# Patient Record
Sex: Male | Born: 1940 | Race: White | Hispanic: No | Marital: Married | Smoking: Former smoker
Health system: Southern US, Community
[De-identification: ages and names within clinical notes are randomized; demographics above are authoritative.]

## PROBLEM LIST (undated history)

## (undated) DIAGNOSIS — J449 Chronic obstructive pulmonary disease, unspecified: Secondary | ICD-10-CM

## (undated) DIAGNOSIS — I739 Peripheral vascular disease, unspecified: Secondary | ICD-10-CM

## (undated) DIAGNOSIS — N183 Chronic kidney disease, stage 3 unspecified: Secondary | ICD-10-CM

## (undated) DIAGNOSIS — R0600 Dyspnea, unspecified: Secondary | ICD-10-CM

## (undated) DIAGNOSIS — Z860100 Personal history of colon polyps, unspecified: Secondary | ICD-10-CM

## (undated) DIAGNOSIS — R233 Spontaneous ecchymoses: Secondary | ICD-10-CM

## (undated) DIAGNOSIS — K219 Gastro-esophageal reflux disease without esophagitis: Secondary | ICD-10-CM

## (undated) DIAGNOSIS — I5032 Chronic diastolic (congestive) heart failure: Secondary | ICD-10-CM

## (undated) DIAGNOSIS — E119 Type 2 diabetes mellitus without complications: Secondary | ICD-10-CM

## (undated) DIAGNOSIS — M069 Rheumatoid arthritis, unspecified: Secondary | ICD-10-CM

## (undated) DIAGNOSIS — I2510 Atherosclerotic heart disease of native coronary artery without angina pectoris: Secondary | ICD-10-CM

## (undated) DIAGNOSIS — J189 Pneumonia, unspecified organism: Secondary | ICD-10-CM

## (undated) DIAGNOSIS — L408 Other psoriasis: Secondary | ICD-10-CM

## (undated) DIAGNOSIS — C4490 Unspecified malignant neoplasm of skin, unspecified: Secondary | ICD-10-CM

## (undated) DIAGNOSIS — R238 Other skin changes: Secondary | ICD-10-CM

## (undated) DIAGNOSIS — E785 Hyperlipidemia, unspecified: Secondary | ICD-10-CM

## (undated) DIAGNOSIS — L409 Psoriasis, unspecified: Secondary | ICD-10-CM

## (undated) DIAGNOSIS — Z9289 Personal history of other medical treatment: Secondary | ICD-10-CM

## (undated) DIAGNOSIS — R0609 Other forms of dyspnea: Secondary | ICD-10-CM

## (undated) DIAGNOSIS — L570 Actinic keratosis: Secondary | ICD-10-CM

## (undated) DIAGNOSIS — I350 Nonrheumatic aortic (valve) stenosis: Secondary | ICD-10-CM

## (undated) DIAGNOSIS — L4050 Arthropathic psoriasis, unspecified: Secondary | ICD-10-CM

## (undated) DIAGNOSIS — I213 ST elevation (STEMI) myocardial infarction of unspecified site: Secondary | ICD-10-CM

## (undated) DIAGNOSIS — M549 Dorsalgia, unspecified: Secondary | ICD-10-CM

## (undated) DIAGNOSIS — M48061 Spinal stenosis, lumbar region without neurogenic claudication: Secondary | ICD-10-CM

## (undated) DIAGNOSIS — M2550 Pain in unspecified joint: Secondary | ICD-10-CM

## (undated) DIAGNOSIS — IMO0002 Reserved for concepts with insufficient information to code with codable children: Secondary | ICD-10-CM

## (undated) DIAGNOSIS — M353 Polymyalgia rheumatica: Secondary | ICD-10-CM

## (undated) DIAGNOSIS — K2970 Gastritis, unspecified, without bleeding: Secondary | ICD-10-CM

## (undated) DIAGNOSIS — G4730 Sleep apnea, unspecified: Secondary | ICD-10-CM

## (undated) DIAGNOSIS — I779 Disorder of arteries and arterioles, unspecified: Secondary | ICD-10-CM

## (undated) DIAGNOSIS — M4120 Other idiopathic scoliosis, site unspecified: Secondary | ICD-10-CM

## (undated) DIAGNOSIS — I119 Hypertensive heart disease without heart failure: Secondary | ICD-10-CM

## (undated) DIAGNOSIS — I480 Paroxysmal atrial fibrillation: Secondary | ICD-10-CM

## (undated) DIAGNOSIS — K922 Gastrointestinal hemorrhage, unspecified: Secondary | ICD-10-CM

## (undated) DIAGNOSIS — N200 Calculus of kidney: Secondary | ICD-10-CM

## (undated) DIAGNOSIS — M1990 Unspecified osteoarthritis, unspecified site: Secondary | ICD-10-CM

## (undated) DIAGNOSIS — G8929 Other chronic pain: Secondary | ICD-10-CM

## (undated) HISTORY — DX: Atherosclerotic heart disease of native coronary artery without angina pectoris: I25.10

## (undated) HISTORY — DX: Paroxysmal atrial fibrillation: I48.0

## (undated) HISTORY — PX: BACK SURGERY: SHX140

## (undated) HISTORY — DX: Other idiopathic scoliosis, site unspecified: M41.20

## (undated) HISTORY — DX: Reserved for concepts with insufficient information to code with codable children: IMO0002

## (undated) HISTORY — DX: Hypertensive heart disease without heart failure: I11.9

## (undated) HISTORY — DX: Gastritis, unspecified, without bleeding: K29.70

## (undated) HISTORY — DX: Chronic kidney disease, stage 3 unspecified: N18.30

## (undated) HISTORY — DX: Nonrheumatic aortic (valve) stenosis: I35.0

## (undated) HISTORY — DX: Actinic keratosis: L57.0

## (undated) HISTORY — DX: Personal history of colon polyps, unspecified: Z86.0100

## (undated) HISTORY — DX: Arthropathic psoriasis, unspecified: L40.50

## (undated) HISTORY — PX: SKIN CANCER EXCISION: SHX779

## (undated) HISTORY — DX: Gastrointestinal hemorrhage, unspecified: K92.2

## (undated) HISTORY — DX: Disorder of arteries and arterioles, unspecified: I77.9

## (undated) HISTORY — PX: POSTERIOR LAMINECTOMY THORACIC SPINE: SHX2252

## (undated) HISTORY — DX: Rheumatoid arthritis, unspecified: M06.9

## (undated) HISTORY — DX: Dyspnea, unspecified: R06.00

## (undated) HISTORY — DX: Gastro-esophageal reflux disease without esophagitis: K21.9

## (undated) HISTORY — DX: Other psoriasis: L40.8

## (undated) HISTORY — DX: Personal history of colonic polyps: Z86.010

## (undated) HISTORY — DX: Chronic diastolic (congestive) heart failure: I50.32

## (undated) HISTORY — DX: Peripheral vascular disease, unspecified: I73.9

## (undated) HISTORY — DX: Polymyalgia rheumatica: M35.3

## (undated) HISTORY — DX: Personal history of other medical treatment: Z92.89

## (undated) HISTORY — DX: Chronic kidney disease, stage 3 (moderate): N18.3

## (undated) HISTORY — DX: Other forms of dyspnea: R06.09

## (undated) HISTORY — DX: Spinal stenosis, lumbar region without neurogenic claudication: M48.061

## (undated) HISTORY — PX: COLONOSCOPY: SHX174

---

## 1977-06-12 HISTORY — PX: VASECTOMY: SHX75

## 1998-06-12 HISTORY — PX: SHOULDER OPEN ROTATOR CUFF REPAIR: SHX2407

## 1999-06-02 ENCOUNTER — Ambulatory Visit (HOSPITAL_BASED_OUTPATIENT_CLINIC_OR_DEPARTMENT_OTHER): Admission: RE | Admit: 1999-06-02 | Discharge: 1999-06-03 | Payer: Self-pay | Admitting: Orthopaedic Surgery

## 2003-08-16 ENCOUNTER — Encounter: Admission: RE | Admit: 2003-08-16 | Discharge: 2003-08-16 | Payer: Self-pay | Admitting: Orthopedic Surgery

## 2003-09-09 ENCOUNTER — Encounter: Admission: RE | Admit: 2003-09-09 | Discharge: 2003-09-09 | Payer: Self-pay | Admitting: Orthopedic Surgery

## 2003-09-30 ENCOUNTER — Encounter: Admission: RE | Admit: 2003-09-30 | Discharge: 2003-09-30 | Payer: Self-pay | Admitting: Orthopedic Surgery

## 2003-10-21 ENCOUNTER — Encounter: Admission: RE | Admit: 2003-10-21 | Discharge: 2003-10-21 | Payer: Self-pay | Admitting: Orthopedic Surgery

## 2004-06-24 ENCOUNTER — Encounter: Admission: RE | Admit: 2004-06-24 | Discharge: 2004-09-22 | Payer: Self-pay | Admitting: Neurosurgery

## 2004-07-21 ENCOUNTER — Ambulatory Visit: Payer: Self-pay | Admitting: Family Medicine

## 2004-08-03 ENCOUNTER — Ambulatory Visit: Payer: Self-pay | Admitting: Family Medicine

## 2004-08-17 ENCOUNTER — Ambulatory Visit: Payer: Self-pay | Admitting: Family Medicine

## 2005-10-31 ENCOUNTER — Ambulatory Visit: Payer: Self-pay | Admitting: Family Medicine

## 2005-11-27 ENCOUNTER — Ambulatory Visit: Payer: Self-pay | Admitting: Family Medicine

## 2005-11-27 LAB — CONVERTED CEMR LAB: PSA: 0.61 ng/mL

## 2006-03-15 ENCOUNTER — Ambulatory Visit: Payer: Self-pay | Admitting: Family Medicine

## 2006-04-04 ENCOUNTER — Ambulatory Visit: Payer: Self-pay | Admitting: Family Medicine

## 2006-05-30 ENCOUNTER — Ambulatory Visit: Payer: Self-pay | Admitting: Family Medicine

## 2006-07-05 ENCOUNTER — Ambulatory Visit: Payer: Self-pay | Admitting: Family Medicine

## 2006-07-05 LAB — CONVERTED CEMR LAB
ALT: 17 units/L (ref 0–40)
AST: 20 units/L (ref 0–37)
Direct LDL: 155.9 mg/dL
Hgb A1c MFr Bld: 6.4 % — ABNORMAL HIGH (ref 4.6–6.0)

## 2006-09-18 ENCOUNTER — Encounter: Payer: Self-pay | Admitting: Family Medicine

## 2006-09-18 DIAGNOSIS — IMO0002 Reserved for concepts with insufficient information to code with codable children: Secondary | ICD-10-CM

## 2006-09-18 DIAGNOSIS — M4120 Other idiopathic scoliosis, site unspecified: Secondary | ICD-10-CM | POA: Insufficient documentation

## 2006-09-18 DIAGNOSIS — E119 Type 2 diabetes mellitus without complications: Secondary | ICD-10-CM | POA: Insufficient documentation

## 2006-09-18 DIAGNOSIS — Z87891 Personal history of nicotine dependence: Secondary | ICD-10-CM

## 2006-09-18 DIAGNOSIS — L408 Other psoriasis: Secondary | ICD-10-CM

## 2006-09-18 DIAGNOSIS — L4050 Arthropathic psoriasis, unspecified: Secondary | ICD-10-CM

## 2006-09-18 DIAGNOSIS — L570 Actinic keratosis: Secondary | ICD-10-CM

## 2006-10-08 ENCOUNTER — Ambulatory Visit: Payer: Self-pay | Admitting: Family Medicine

## 2006-10-08 LAB — CONVERTED CEMR LAB
Albumin: 3.6 g/dL (ref 3.5–5.2)
GFR calc Af Amer: 86 mL/min
GFR calc non Af Amer: 71 mL/min
Glucose, Bld: 138 mg/dL — ABNORMAL HIGH (ref 70–99)
Microalb Creat Ratio: 7.7 mg/g (ref 0.0–30.0)
Phosphorus: 2.7 mg/dL (ref 2.3–4.6)
Sodium: 144 meq/L (ref 135–145)

## 2006-10-12 ENCOUNTER — Ambulatory Visit: Payer: Self-pay | Admitting: Family Medicine

## 2007-01-14 ENCOUNTER — Ambulatory Visit: Payer: Self-pay | Admitting: Family Medicine

## 2007-01-17 ENCOUNTER — Ambulatory Visit: Payer: Self-pay | Admitting: Family Medicine

## 2007-01-17 DIAGNOSIS — Z860100 Personal history of colon polyps, unspecified: Secondary | ICD-10-CM | POA: Insufficient documentation

## 2007-01-17 LAB — CONVERTED CEMR LAB
ALT: 15 units/L (ref 0–53)
Creatinine,U: 126.7 mg/dL
HDL: 27.3 mg/dL — ABNORMAL LOW (ref >39.0)
Hgb A1c MFr Bld: 6.1 % — ABNORMAL HIGH (ref 4.6–6.0)
Total CHOL/HDL Ratio: 6.9
Triglycerides: 68 mg/dL (ref 0–149)
VLDL: 14 mg/dL (ref 0–40)

## 2007-03-23 ENCOUNTER — Encounter: Admission: RE | Admit: 2007-03-23 | Discharge: 2007-03-23 | Payer: Self-pay

## 2007-04-05 ENCOUNTER — Encounter: Admission: RE | Admit: 2007-04-05 | Discharge: 2007-04-05 | Payer: Self-pay

## 2007-04-19 ENCOUNTER — Encounter: Admission: RE | Admit: 2007-04-19 | Discharge: 2007-04-19 | Payer: Self-pay

## 2007-04-19 ENCOUNTER — Ambulatory Visit: Payer: Self-pay | Admitting: Family Medicine

## 2007-04-19 ENCOUNTER — Telehealth (INDEPENDENT_AMBULATORY_CARE_PROVIDER_SITE_OTHER): Payer: Self-pay | Admitting: *Deleted

## 2007-04-22 LAB — CONVERTED CEMR LAB
Albumin: 3.8 g/dL (ref 3.5–5.2)
Chloride: 104 meq/L (ref 96–112)
Cholesterol: 210 mg/dL (ref 0–200)
Creatinine, Ser: 1.2 mg/dL (ref 0.4–1.5)
Direct LDL: 160 mg/dL
Glucose, Bld: 110 mg/dL — ABNORMAL HIGH (ref 70–99)
HDL: 30 mg/dL — ABNORMAL LOW (ref >39.0)
PSA: 0.63 ng/mL (ref 0.10–4.00)
Phosphorus: 3.3 mg/dL (ref 2.3–4.6)
Sodium: 139 meq/L (ref 135–145)
Triglycerides: 84 mg/dL (ref 0–149)

## 2007-04-24 ENCOUNTER — Encounter: Payer: Self-pay | Admitting: Family Medicine

## 2007-05-03 ENCOUNTER — Encounter: Admission: RE | Admit: 2007-05-03 | Discharge: 2007-05-03 | Payer: Self-pay

## 2007-06-13 HISTORY — PX: LITHOTRIPSY: SUR834

## 2008-04-21 ENCOUNTER — Encounter: Payer: Self-pay | Admitting: Family Medicine

## 2008-04-27 ENCOUNTER — Encounter: Payer: Self-pay | Admitting: Family Medicine

## 2008-04-29 ENCOUNTER — Ambulatory Visit: Payer: Self-pay | Admitting: Family Medicine

## 2008-04-29 DIAGNOSIS — M069 Rheumatoid arthritis, unspecified: Secondary | ICD-10-CM

## 2008-05-02 LAB — CONVERTED CEMR LAB
Cholesterol: 229 mg/dL (ref 0–200)
HDL: 27.5 mg/dL — ABNORMAL LOW (ref >39.0)
Triglycerides: 129 mg/dL (ref 0–149)
VLDL: 26 mg/dL (ref 0–40)

## 2008-05-04 ENCOUNTER — Telehealth (INDEPENDENT_AMBULATORY_CARE_PROVIDER_SITE_OTHER): Payer: Self-pay | Admitting: *Deleted

## 2008-05-13 ENCOUNTER — Ambulatory Visit: Payer: Self-pay | Admitting: Internal Medicine

## 2008-05-20 ENCOUNTER — Encounter: Payer: Self-pay | Admitting: Family Medicine

## 2008-05-20 ENCOUNTER — Ambulatory Visit: Payer: Self-pay | Admitting: Internal Medicine

## 2008-05-22 ENCOUNTER — Ambulatory Visit: Payer: Self-pay | Admitting: Internal Medicine

## 2008-05-22 ENCOUNTER — Encounter: Payer: Self-pay | Admitting: Internal Medicine

## 2008-05-25 ENCOUNTER — Encounter: Payer: Self-pay | Admitting: Internal Medicine

## 2008-05-25 ENCOUNTER — Telehealth: Payer: Self-pay | Admitting: Family Medicine

## 2008-06-12 DIAGNOSIS — Z9289 Personal history of other medical treatment: Secondary | ICD-10-CM

## 2008-06-12 HISTORY — DX: Personal history of other medical treatment: Z92.89

## 2008-06-12 HISTORY — PX: CARDIAC CATHETERIZATION: SHX172

## 2008-08-04 ENCOUNTER — Ambulatory Visit: Payer: Self-pay | Admitting: Family Medicine

## 2008-08-18 ENCOUNTER — Encounter: Payer: Self-pay | Admitting: Family Medicine

## 2008-09-01 ENCOUNTER — Encounter: Payer: Self-pay | Admitting: Family Medicine

## 2008-09-16 ENCOUNTER — Ambulatory Visit (HOSPITAL_COMMUNITY): Admission: RE | Admit: 2008-09-16 | Discharge: 2008-09-16 | Payer: Self-pay | Admitting: Neurosurgery

## 2008-09-22 ENCOUNTER — Encounter: Payer: Self-pay | Admitting: Family Medicine

## 2008-11-26 ENCOUNTER — Ambulatory Visit (HOSPITAL_COMMUNITY): Admission: RE | Admit: 2008-11-26 | Discharge: 2008-11-26 | Payer: Self-pay | Admitting: Urology

## 2008-11-27 ENCOUNTER — Encounter (INDEPENDENT_AMBULATORY_CARE_PROVIDER_SITE_OTHER): Payer: Self-pay | Admitting: *Deleted

## 2008-12-11 ENCOUNTER — Encounter: Payer: Self-pay | Admitting: Family Medicine

## 2008-12-24 ENCOUNTER — Ambulatory Visit (HOSPITAL_COMMUNITY): Admission: RE | Admit: 2008-12-24 | Discharge: 2008-12-24 | Payer: Self-pay | Admitting: Urology

## 2009-02-01 ENCOUNTER — Encounter: Payer: Self-pay | Admitting: Internal Medicine

## 2009-02-10 HISTORY — PX: CORONARY ANGIOPLASTY WITH STENT PLACEMENT: SHX49

## 2009-02-27 ENCOUNTER — Encounter: Payer: Self-pay | Admitting: Cardiovascular Disease

## 2009-02-27 ENCOUNTER — Emergency Department: Payer: BLUE CROSS/BLUE SHIELD | Admitting: Unknown Physician Specialty

## 2009-02-28 ENCOUNTER — Encounter: Payer: Self-pay | Admitting: Cardiovascular Disease

## 2009-03-01 ENCOUNTER — Encounter: Payer: Self-pay | Admitting: Cardiovascular Disease

## 2009-03-09 ENCOUNTER — Ambulatory Visit: Payer: Self-pay | Admitting: Family Medicine

## 2009-03-09 DIAGNOSIS — I2510 Atherosclerotic heart disease of native coronary artery without angina pectoris: Secondary | ICD-10-CM

## 2009-03-30 ENCOUNTER — Ambulatory Visit: Payer: Self-pay | Admitting: Family Medicine

## 2009-03-31 LAB — CONVERTED CEMR LAB
ALT: 37 units/L (ref 0–53)
AST: 26 units/L (ref 0–37)
HDL: 22 mg/dL — ABNORMAL LOW (ref >39.00)
LDL Cholesterol: 64 mg/dL (ref 0–99)
Total CHOL/HDL Ratio: 5
VLDL: 14.6 mg/dL (ref 0.0–40.0)

## 2009-04-15 ENCOUNTER — Encounter: Payer: Self-pay | Admitting: Family Medicine

## 2009-04-29 ENCOUNTER — Encounter (INDEPENDENT_AMBULATORY_CARE_PROVIDER_SITE_OTHER): Payer: Self-pay | Admitting: Neurosurgery

## 2009-04-29 ENCOUNTER — Ambulatory Visit: Payer: Self-pay | Admitting: Cardiovascular Disease

## 2009-04-29 ENCOUNTER — Ambulatory Visit: Payer: Self-pay | Admitting: Vascular Surgery

## 2009-04-29 ENCOUNTER — Ambulatory Visit (HOSPITAL_COMMUNITY): Admission: RE | Admit: 2009-04-29 | Discharge: 2009-04-29 | Payer: Self-pay | Admitting: Neurosurgery

## 2009-05-12 DIAGNOSIS — K2970 Gastritis, unspecified, without bleeding: Secondary | ICD-10-CM

## 2009-05-12 HISTORY — DX: Gastritis, unspecified, without bleeding: K29.70

## 2009-05-17 ENCOUNTER — Encounter: Payer: Self-pay | Admitting: Family Medicine

## 2009-05-17 ENCOUNTER — Telehealth: Payer: Self-pay | Admitting: Family Medicine

## 2009-05-18 ENCOUNTER — Ambulatory Visit: Payer: Self-pay | Admitting: Family Medicine

## 2009-05-19 ENCOUNTER — Telehealth: Payer: Self-pay | Admitting: Family Medicine

## 2009-05-20 ENCOUNTER — Telehealth: Payer: Self-pay | Admitting: Family Medicine

## 2009-05-21 ENCOUNTER — Inpatient Hospital Stay (HOSPITAL_COMMUNITY): Admission: EM | Admit: 2009-05-21 | Discharge: 2009-05-25 | Payer: Self-pay | Admitting: Cardiology

## 2009-05-21 ENCOUNTER — Ambulatory Visit: Payer: Self-pay | Admitting: Cardiology

## 2009-05-21 ENCOUNTER — Telehealth: Payer: Self-pay | Admitting: Family Medicine

## 2009-05-21 DIAGNOSIS — R011 Cardiac murmur, unspecified: Secondary | ICD-10-CM | POA: Insufficient documentation

## 2009-05-21 DIAGNOSIS — R0989 Other specified symptoms and signs involving the circulatory and respiratory systems: Secondary | ICD-10-CM | POA: Insufficient documentation

## 2009-05-24 ENCOUNTER — Encounter: Payer: Self-pay | Admitting: Gastroenterology

## 2009-05-25 ENCOUNTER — Ambulatory Visit: Payer: Self-pay | Admitting: Gastroenterology

## 2009-06-03 ENCOUNTER — Telehealth: Payer: Self-pay | Admitting: Family Medicine

## 2009-06-07 ENCOUNTER — Encounter: Payer: Self-pay | Admitting: Family Medicine

## 2009-06-09 ENCOUNTER — Telehealth: Payer: Self-pay | Admitting: Internal Medicine

## 2009-06-09 ENCOUNTER — Ambulatory Visit: Payer: Self-pay | Admitting: Family Medicine

## 2009-06-09 LAB — CONVERTED CEMR LAB
Basophils Absolute: 0.1 10*3/uL (ref 0.0–0.1)
Basophils Relative: 1 % (ref 0.0–3.0)
Eosinophils Absolute: 0.2 10*3/uL (ref 0.0–0.7)
MCHC: 33.2 g/dL (ref 30.0–36.0)
MCV: 101.1 fL — ABNORMAL HIGH (ref 78.0–100.0)
Monocytes Absolute: 0.9 10*3/uL (ref 0.1–1.0)
Neutrophils Relative %: 68 % (ref 43.0–77.0)
Platelets: 199 10*3/uL (ref 150.0–400.0)
RBC: 3.27 M/uL — ABNORMAL LOW (ref 4.22–5.81)
RDW: 14.2 % (ref 11.5–14.6)

## 2009-06-10 ENCOUNTER — Ambulatory Visit: Payer: Self-pay | Admitting: Gastroenterology

## 2009-06-10 DIAGNOSIS — K5520 Angiodysplasia of colon without hemorrhage: Secondary | ICD-10-CM | POA: Insufficient documentation

## 2009-06-14 ENCOUNTER — Ambulatory Visit (HOSPITAL_COMMUNITY): Admission: RE | Admit: 2009-06-14 | Discharge: 2009-06-14 | Payer: Self-pay | Admitting: Cardiovascular Disease

## 2009-06-14 ENCOUNTER — Ambulatory Visit: Payer: Self-pay | Admitting: Cardiovascular Disease

## 2009-06-21 ENCOUNTER — Telehealth (INDEPENDENT_AMBULATORY_CARE_PROVIDER_SITE_OTHER): Payer: Self-pay | Admitting: *Deleted

## 2009-06-23 ENCOUNTER — Ambulatory Visit: Payer: Self-pay

## 2009-06-23 ENCOUNTER — Encounter: Payer: Self-pay | Admitting: Cardiovascular Disease

## 2009-06-23 ENCOUNTER — Ambulatory Visit: Payer: Self-pay | Admitting: Cardiology

## 2009-06-23 ENCOUNTER — Encounter (HOSPITAL_COMMUNITY): Admission: RE | Admit: 2009-06-23 | Discharge: 2009-08-16 | Payer: Self-pay | Admitting: Cardiovascular Disease

## 2009-06-25 ENCOUNTER — Telehealth: Payer: Self-pay | Admitting: Cardiovascular Disease

## 2009-07-01 ENCOUNTER — Telehealth: Payer: Self-pay | Admitting: Cardiovascular Disease

## 2009-07-05 ENCOUNTER — Encounter (INDEPENDENT_AMBULATORY_CARE_PROVIDER_SITE_OTHER): Payer: Self-pay | Admitting: *Deleted

## 2009-07-23 ENCOUNTER — Encounter: Payer: Self-pay | Admitting: Family Medicine

## 2009-07-28 ENCOUNTER — Encounter: Payer: Self-pay | Admitting: Family Medicine

## 2009-07-28 ENCOUNTER — Telehealth: Payer: Self-pay | Admitting: Family Medicine

## 2009-07-30 ENCOUNTER — Ambulatory Visit: Payer: Self-pay | Admitting: Cardiovascular Disease

## 2009-07-30 LAB — CONVERTED CEMR LAB
Basophils Absolute: 0.1 10*3/uL (ref 0.0–0.1)
Basophils Relative: 0.9 % (ref 0.0–3.0)
Eosinophils Absolute: 0.1 10*3/uL (ref 0.0–0.7)
Lymphocytes Relative: 34.1 % (ref 12.0–46.0)
MCHC: 33.7 g/dL (ref 30.0–36.0)
Monocytes Relative: 8.6 % (ref 3.0–12.0)
Neutrophils Relative %: 54.1 % (ref 43.0–77.0)
RBC: 3.38 M/uL — ABNORMAL LOW (ref 4.22–5.81)

## 2009-08-13 ENCOUNTER — Ambulatory Visit: Payer: Self-pay | Admitting: Family Medicine

## 2009-08-16 LAB — CONVERTED CEMR LAB
Creatinine,U: 97.9 mg/dL
Microalb, Ur: 1.4 mg/dL (ref 0.0–1.9)

## 2009-09-13 ENCOUNTER — Ambulatory Visit: Payer: Self-pay | Admitting: Cardiovascular Disease

## 2009-10-15 ENCOUNTER — Telehealth: Payer: Self-pay | Admitting: Family Medicine

## 2010-01-11 ENCOUNTER — Encounter: Payer: Self-pay | Admitting: Family Medicine

## 2010-02-10 DIAGNOSIS — I213 ST elevation (STEMI) myocardial infarction of unspecified site: Secondary | ICD-10-CM

## 2010-02-10 HISTORY — DX: ST elevation (STEMI) myocardial infarction of unspecified site: I21.3

## 2010-02-15 ENCOUNTER — Ambulatory Visit: Payer: Self-pay | Admitting: Family Medicine

## 2010-02-15 LAB — CONVERTED CEMR LAB
ALT: 38 units/L (ref 0–53)
GFR calc non Af Amer: 55.44 mL/min (ref >60)
Glucose, Bld: 124 mg/dL — ABNORMAL HIGH (ref 70–99)
Hgb A1c MFr Bld: 7.1 % — ABNORMAL HIGH (ref 4.6–6.5)
Phosphorus: 3.3 mg/dL (ref 2.3–4.6)
Potassium: 5.3 meq/L — ABNORMAL HIGH (ref 3.5–5.1)
Sodium: 143 meq/L (ref 135–145)
Total CHOL/HDL Ratio: 4
Triglycerides: 100 mg/dL (ref 0.0–149.0)

## 2010-02-18 ENCOUNTER — Ambulatory Visit: Payer: Self-pay | Admitting: Family Medicine

## 2010-02-18 DIAGNOSIS — E875 Hyperkalemia: Secondary | ICD-10-CM

## 2010-03-04 ENCOUNTER — Ambulatory Visit: Payer: Self-pay | Admitting: Family Medicine

## 2010-03-07 LAB — CONVERTED CEMR LAB: Potassium: 4.7 meq/L (ref 3.5–5.1)

## 2010-03-11 ENCOUNTER — Encounter: Payer: Self-pay | Admitting: Family Medicine

## 2010-03-17 ENCOUNTER — Telehealth: Payer: Self-pay | Admitting: Family Medicine

## 2010-03-17 ENCOUNTER — Encounter: Payer: Self-pay | Admitting: Family Medicine

## 2010-03-17 ENCOUNTER — Ambulatory Visit: Payer: Self-pay | Admitting: Cardiovascular Disease

## 2010-04-08 ENCOUNTER — Encounter: Payer: Self-pay | Admitting: Cardiovascular Disease

## 2010-04-08 ENCOUNTER — Ambulatory Visit: Payer: Self-pay

## 2010-05-20 ENCOUNTER — Ambulatory Visit: Payer: Self-pay | Admitting: Family Medicine

## 2010-05-24 LAB — CONVERTED CEMR LAB
Calcium: 9.1 mg/dL (ref 8.4–10.5)
Creatinine, Ser: 1.4 mg/dL (ref 0.4–1.5)
Glucose, Bld: 126 mg/dL — ABNORMAL HIGH (ref 70–99)
HCT: 35 % — ABNORMAL LOW (ref 39.0–52.0)
Hgb A1c MFr Bld: 6.7 % — ABNORMAL HIGH (ref 4.6–6.5)
Lymphs Abs: 2.4 10*3/uL (ref 0.7–4.0)
Monocytes Absolute: 0.6 10*3/uL (ref 0.1–1.0)
Monocytes Relative: 10.1 % (ref 3.0–12.0)
Neutrophils Relative %: 46.1 % (ref 43.0–77.0)
Phosphorus: 3.4 mg/dL (ref 2.3–4.6)
Platelets: 243 10*3/uL (ref 150.0–400.0)
Potassium: 5.4 meq/L — ABNORMAL HIGH (ref 3.5–5.1)
RDW: 15 % — ABNORMAL HIGH (ref 11.5–14.6)
Sodium: 142 meq/L (ref 135–145)
WBC: 6 10*3/uL (ref 4.5–10.5)

## 2010-05-26 ENCOUNTER — Encounter: Payer: Self-pay | Admitting: Family Medicine

## 2010-05-27 ENCOUNTER — Ambulatory Visit: Payer: Self-pay | Admitting: Family Medicine

## 2010-05-30 LAB — CONVERTED CEMR LAB
ALT: 33 units/L (ref 0–53)
HDL: 34.4 mg/dL — ABNORMAL LOW (ref >39.00)
LDL Cholesterol: 146 mg/dL — ABNORMAL HIGH (ref 0–99)
Total CHOL/HDL Ratio: 6

## 2010-06-12 HISTORY — PX: CATARACT EXTRACTION W/ INTRAOCULAR LENS  IMPLANT, BILATERAL: SHX1307

## 2010-07-12 NOTE — Progress Notes (Signed)
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 Pt. states that The [employer] requires a written note of released to go back to work.The note needs to include the date he can go back to work.  I let pt. know will send this  message to the MD and his nurse desktop. Ollen Gross, RN, BSN  July 01, 2009 1:50 PM   Additional Follow-up for Phone Call Additional follow up Details #1::        Deb,  Can you send a note out.  Ok to go back to work at any time.  History of stent 9/10 and just had a normal nuclear study. Previous hospital admission from GI bleed not heart Additional Follow-up by: Colon Branch, MD, [facility],  July 05, 2009 11:54 AM

## 2010-07-12 NOTE — Progress Notes (Signed)
Summary: Nuclear Pre-Procedure  Phone Note Outgoing Call Call back at [facility] Phone [phone]   Call placed by: Stanton Kidney, EMT-P,  June 21, 2009 3:33 PM Call placed to: Patient Action Taken: Phone Call Completed Summary of Call: Reviewed information on [employer] Information Sheet (see scanned document for further details).  Spoke with Patient. Advised cell # [phone], if needed.     Nuclear Med Background Indications for Stress Test: Evaluation for Ischemia, Post Hospital  Indications Comments: 05/25/09 (-) enzymes, /BNP  History: COPD, Heart Catheterization, Myocardial Infarction, Stents  History Comments: 02/27/09 MI: STEMI > Heart Cath > Stent: CFX ([facility])  Symptoms: Chest Pain    Nuclear Pre-Procedure Cardiac Risk Factors: Family History - CAD, History of Smoking, Lipids, NIDDM, Smoker Height (in): 70

## 2010-07-12 NOTE — Progress Notes (Signed)
Summary: Medicare request documentaion compliance form  Phone Note From Pharmacy Call back at fax [phone]   Caller: [facility] Call For: Dr. Milinda Antis  Summary of Call: Medicare request for documentation complialnce form due 10/19/09 was faxed from [facility]. Is on your shelf in the in box. Initial call taken by: Lewanda Rife LPN,  Oct 16, 2954 4:21 PM  Follow-up for Phone Call        ok- form done and in nurse in box-- please send prog notes  Follow-up by: Judith Part MD,  Oct 15, 2009 5:22 PM  Additional Follow-up for Phone Call Additional follow up Details #1::        Completetd form and progress notes notes from 08/13/2009 and AIC from 08/13/09 faxed to [phone] as instructed.Lewanda Rife LPN  Oct 18, 6960 10:51 AM

## 2010-07-12 NOTE — Progress Notes (Signed)
Summary: documentation needed for test strips  Phone Note From Pharmacy   [facility] Summary of Call: Pharmacy is asking for documentation for diabetic test strips, letter is on  your shelf. Initial call taken by: Lowella Petties CMA,  March 17, 2010 9:03 AM  Follow-up for Phone Call        I am not the one that deals with diabetic test strips??? Follow-up by: Colon Branch, MD, [facility],  March 17, 2010 11:18 AM     Appended Document: documentation needed for test strips it sounds like they need the past 6 months of office notes from me --please photocopy and send with the form - thanks  Appended Document: documentation needed for test strips Completed form and and office notes from 02/18/10 faxed to [phone]. Form sent for scanning.

## 2010-07-12 NOTE — Medication Information (Signed)
Summary: [facility]lies/[facility]  Diabetes Supplies/[facility]   Imported By: Lanelle Bal 07/31/2009 11:47:36  _____________________________________________________________________  External Attachment:    Type:   Image     Comment:   External Document

## 2010-07-12 NOTE — Assessment & Plan Note (Signed)
Summary: eph/per Rhonda/jss   Primary Provider:  Roxy Manns, MD  CC:  sob.  History of Present Illness: Curtis Frazier is seen today post hospital D/C.  He was admitted by [facility] for SSCP.  He has a BMS in his circ from [facility] in 02/2009.  It turns out he had a significant GI bleed from a likely AVM>  His Hb decreased into the 7 range and he required multiple tranfusions.  He was instructed to stop omeprozole due to its interaction with Plavix which was just restarted.  However he did not want to pay $114 dollars for Protonix.  He is not having the type of chest pain he had during hospitalization.  His EGD did show gastritis.  I would like to do a stress myovue on him since we may need to stop his plavix again and I would like to make sure there is no evidence of ischemia. We will check a ADP platelet inhibition test to see how effective the Plavix is while on omeprazole in this individual.  He is due to have his HB and HCT rechecked and I will draw these at the same time.  He denies recurrent melena, abdominal pain.  He has had a bad "cold" with sinus congestion.  He inidcates fervers as high as 102.  He has a cough but no sputum production.    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 Weekly 2)  Hydrocodone-Acetaminophen 5-500 Mg Tabs (Hydrocodone-Acetaminophen) .Marland Kitchen.. 1 By Mouth Two Times A Day 3)  Diabetic Test Strips and Lancets For Contour Meter .... Check Glucose Two Times A Day and As Needed For Dm 250.0 That Is Not Adequately Controlled 4)  Nitrostat 0.4 Mg Subl (Nitroglycerin) .... Place Under Tongue Every 5 Min As Needed 5)  Lipitor 80 Mg Tabs (Atorvastatin Calcium) .... Take 1 Tablet By Mouth Once A Day At Bedtime 6)  Toprol Xl 25 Mg Xr24h-Tab (Metoprolol Succinate) .... Take 1 Tablet By Mouth Once A Day 7)  Multivitamins   Tabs (Multiple Vitamin) .... Once Daily 8)  Vitamin D 1000 Unit  Tabs (Cholecalciferol) .... Once Daily 9)  Fish Oil   Oil (Fish Oil) .... Once Daily 10)  Zantac 300 Mg Tabs (Ranitidine Hcl) .Marland Kitchen.. 1 Tab By Mouth Two Times A Day 11)  Zithromax Tri-Pak 500 Mg Tabs (Azithromycin) .... Take As Directed  Allergies (verified): 1)  ! Penicillin 2)  ! 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 GASTRITIS 12/10           rhem- Rowe  cardiolEden Emms  Past Surgical History: Last updated: 06/10/2009 Rotator cuff repair Vasectomy Colonoscopy- polyps, hemorrhoids, AVM (06/2002) (12/09) colonoscopy - adenomatous polyp, AVM, - re check 5 years  hosp 9/10 with CAD/MI- cath at  [facility]/ stents 12/10 admit CP and GI bleed (gastritis and colon avm) 12/10 colonoscopy- AVM 12/10 EGD - gastritis PTCA-Stent 02/2009  Family History: Last updated: 01/17/2007 mother CAD, DM sister CAD, HTN, renal failure 2 brothers DM brother pancreatic cancer aunts and uncles-?ca brother with prostate cancer  Social History: Last updated: 04/29/2009 Quit smoking with MI 02/2009 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 All other systems reviewed and negative  Vital Signs:  Patient profile:   70 year old male Height:      70 inches Weight:      175 pounds BMI:     25.20 Pulse rate:   61 / minute Resp:     12 per minute BP sitting:   118 / 62  (left arm)  Vitals Entered By: Kem Parkinson (June 14, 2009 1:58 PM)  Physical Exam  General:  Affect appropriate Healthy:  appears stated age HEENT: normal Neck supple with no adenopathy JVP normal no bruits no thyromegaly Lungs clear with no wheezing and good diaphragmatic motion Heart:  S1/S2 no murmur,rub, gallop or click PMI normal Abdomen: benighn, BS positve, no tenderness, no AAA no bruit.  No HSM or HJR Distal pulses intact with no bruits No edema Neuro non-focal Skin warm and dry    Impression & Recommendations:  Problem # 1:  ANEMIA-UNSPECIFIED (ICD-285.9) Recent GI bleed.  F/U labs and gi visit.  Will see if omeprazole is limiting Plavix efficacy with ADP tes Orders: TLB-Iron, (Fe) Total (83540-FE) TLB-Ferritin (82728-FER) TLB-CBC Platelet - w/Differential (85025-CBCD) T- * Misc. Laboratory test [phone]) TLB-IBC Pnl (Iron/FE;Transferrin) (83550-IBC)  Problem # 2:  CHEST PAIN  (ICD-786.50) F/U myouve.  Histroy of BMS in September.  If anemia persists may need to stop Plaviix.  Would be reasuring to have non-ischemic myovue His updated medication list for this problem includes:    Nitrostat 0.4 Mg Subl (Nitroglycerin) .Marland Kitchen... Place under tongue every 5 min as needed    Toprol Xl 25 Mg Xr24h-tab (Metoprolol succinate) .Marland Kitchen... Take 1 tablet by mouth once a day  Orders: Nuclear Stress Test (Nuc Stress Test)  Problem # 3:  HYPERLIPIDEMIA (ICD-272.4) At goal for CAD.  F/U lft's in 6 months His updated medication list for this problem includes:    Lipitor 80 Mg Tabs (Atorvastatin calcium) .Marland Kitchen... Take 1 tablet by mouth once a day at bedtime  CHOL: 101 (03/30/2009)   LDL: 64 (03/30/2009)   HDL: 22.00 (03/30/2009)   TG: 73.0 (03/30/2009)  Problem # 4:  SINUSITIS- ACUTE-NOS (ICD-461.9) URI with sinus congestion and "fevers"  F/U primary but will call in Zpack  Otherwise continue with Mucinex.  Patient Instructions: 1)  Your physician recommends that you schedule a follow-up appointment  in: 3 months 2)  Your physician has recommended you make the following change in your medication: zithromax tri-pak as directed Prescriptions: ZITHROMAX TRI-PAK 500 MG TABS (AZITHROMYCIN) take as directed  #1 x 0   Entered by:   Deliah Goody, RN   Authorized by:   Colon Branch, MD, [facility]   Signed by:   Deliah Goody, RN on 06/14/2009   Method used:   [facility] [phone][facility] (retail)       [address]       [place], [place]  [id number]       Ph: [phone] or [phone]       Fax: [phone]   RxID:   [id number]

## 2010-07-12 NOTE — Assessment & Plan Note (Signed)
Summary: 2 month follow up/rbh   Vital Signs:  Patient profile:   70 year old male Height:      70 inches Weight:      175.25 pounds BMI:     25.24 Temp:     98 degrees F oral Pulse rate:   60 / minute Pulse rhythm:   regular BP sitting:   110 / 64  (left arm) Cuff size:   regular  Vitals Entered By: Lewanda Rife LPN (August 14, 6043 8:29 AM)  History of Present Illness: here for f/u of mult med probs incl gastritis/gerd/ lipid/DM 2/anemia   feeling ok  nothing new since december  jan - had a stress test- turned out ok -- was stressful   wt is up 9 lb today  is from eating more and feeling better in general   gerd/gastritis is "doing ok"-= very seldom has breakthrough symptoms on PPI cardiology is aware of the omeprazole with plavix   sugars have been higher lately  usually 130s-135 (or occas in teens to the 120s)  is sticking to a DM diet " best he can" he cheats maybe once per week  has not exercising at all    bp is 110/64    lipids were good in fall at goal with LDL 60s -- CAD  is fair with low chol diet  continues to not smoke- proud of that   DM has been also well controlled wiht AIC 6.5 in fall opthy-- over a year ago -- goes to Dr Caryn Section    anemia stable withHB of 11.1 after GI bleed that was stopped     Allergies: 1)  ! Penicillin 2)  ! Tetracycline 3)  ! 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) PSORIASIS (ICD-696.1) SCOLIOSIS (ICD-737.30) DEGENERATIVE DISC DISEASE (ICD-722.6) DIABETES MELLITUS, TYPE II (ICD-250.00) GERD (ICD-530.81) GI BLEED 12/10- CECAL AVM GASTRITIS 12/10           rhem- Rowe  cardiolEden Emms  Past Surgical History: Last updated:  06/10/2009 Rotator cuff repair Vasectomy Colonoscopy- polyps, hemorrhoids, AVM (06/2002) (12/09) colonoscopy - adenomatous polyp, AVM, - re check 5 years  hosp 9/10 with CAD/MI- cath at [facility]/ stents 12/10 admit CP and GI bleed (gastritis and colon avm) 12/10 colonoscopy- AVM 12/10 EGD - gastritis PTCA-Stent 02/2009  Family History: Last updated: 01/17/2007 mother CAD, DM sister CAD, HTN, renal failure 2 brothers DM brother pancreatic cancer aunts and uncles-?ca brother with prostate cancer  Social History: Last updated: 04/29/2009 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 Eyes:  Denies blurring and eye pain. ENT:  Denies sore throat. CV:  Denies chest pain or discomfort, near fainting, palpitations, and shortness of breath with exertion. Resp:  Denies cough, pleuritic, shortness of breath, and wheezing. GI:  Denies abdominal pain, bloody stools, change in bowel habits, indigestion, nausea, and vomiting. GU:  Denies hematuria. MS:  Complains of stiffness; denies joint pain, cramps, and muscle weakness. Derm:  Denies itching, lesion(s), poor wound healing, and rash. Neuro:  Denies numbness and tingling. Psych:  Denies anxiety and depression. Endo:  Denies cold intolerance, excessive thirst, excessive urination, and heat intolerance. Heme:  Denies abnormal bruising and bleeding.  Physical Exam  General:  Well-developed,well-nourished,in no acute distress; alert,appropriate and cooperative throughout examination some wt gain is noted  Head:  normocephalic, atraumatic, and no abnormalities observed.   Eyes:  vision grossly intact, pupils equal, pupils round, and pupils reactive to light.  no conjunctival pallor, injection or icterus   Mouth:  pharynx pink and moist.   Neck:  supple with full rom and no masses or thyromegally, no JVD or carotid bruit  Chest Wall:  No deformities, masses, tenderness or gynecomastia noted. Lungs:  diffusely distant bs no rales/crackles or wheeze nl exp phase Heart:  Normal rate and regular rhythm. S1 and S2 normal without gallop, murmur, click, rub or other extra sounds. Abdomen:  Bowel sounds positive,abdomen soft and non-tender without masses, organomegaly or hernias noted. no renal bruits  Msk:  No deformity or scoliosis noted of thoracic or lumbar spine.  no acute joint changes  Pulses:  R and L carotid,radial,femoral,dorsalis pedis and posterior tibial pulses are full and equal bilaterally Extremities:  No clubbing, cyanosis, edema, or deformity noted with normal full range of motion of all joints.   Neurologic:  sensation intact to light touch, gait normal, and DTRs symmetrical and normal.   Skin:  Intact without suspicious lesions or rashes no pallor  Cervical Nodes:  No lymphadenopathy noted Inguinal Nodes:  No significant adenopathy Psych:  normal affect, talkative and pleasant    Impression & Recommendations:  Problem # 1:  HYPERLIPIDEMIA (ICD-272.4) Assessment Improved  very well controlled with lipitor  pt will ask his cardiologist about whether he can change to generic in  future for cost  disc imp of LDL under 70 given recent coronary artery disease and DM  re check 30mo and f/u rev low sat fat diet  His updated medication list for this problem includes:    Lipitor 80 Mg Tabs (Atorvastatin calcium) .Marland Kitchen... Take 1 tablet by mouth once a day at bedtime  Labs Reviewed: SGOT: 26 (03/30/2009)   SGPT: 37 (03/30/2009)   HDL:22.00 (03/30/2009), 27.5 (04/29/2008)  LDL:64 (03/30/2009), DEL (16/03/9603)  Chol:101 (03/30/2009), 229 (04/29/2008)  Trig:73.0 (03/30/2009), 129 (04/29/2008)  Problem # 2:  DIABETES MELLITUS, TYPE II (ICD-250.00) Assessment: Deteriorated sugars up a  bit with wt gain and diff diet and no exercise disc healthy diet (low simple sugar/ choose complex carbs/ low sat fat) diet and exercise in detail  enc to start exercise if cleared by cardiol AIC and microalb ok  pt will sched opthy when he can afford to  next lab and f/u 6 mo  His updated medication list for this problem includes:    Aspirin 81 Mg Tabs (Aspirin) .Marland Kitchen... Take one daily  Orders: Venipuncture (54098) TLB-A1C / Hgb A1C (Glycohemoglobin) (83036-A1C) TLB-Microalbumin/Creat Ratio, Urine (82043-MALB)  Problem # 3:  ANEMIA-UNSPECIFIED (ICD-285.9) Assessment: Unchanged this is stable - no change - rev lab  no more evid of GI bleed  Problem # 4:  GERD (ICD-530.81) well controlled with PPI  aware of interact with plavix -- his cardiol aware will continue this  watch out for inc sympt with wt gain The following medications were removed from the medication list:    Zantac 300 Mg Tabs (Ranitidine hcl) .Marland Kitchen... 1 tab by mouth two times a day His updated medication list for this problem includes:    Omeprazole 40 Mg Cpdr (Omeprazole) .Marland Kitchen... Take one tablet  30-60 minutes before breakfast  Complete Medication List: 1)  Methotrexate 17.5 Mg Tabs (methotrexate Sodium)  .... Weekly 2)  Hydrocodone-acetaminophen 5-500 Mg Tabs (Hydrocodone-acetaminophen) .Marland Kitchen.. 1 by mouth two times a day 3)  Diabetic Test Strips and Lancets For Contour Meter  .... Check glucose two times a day and as needed for dm 250.0 that is not adequately controlled 4)  Nitrostat 0.4 Mg Subl (Nitroglycerin) .... Place under tongue every 5 min as needed 5)  Lipitor 80 Mg Tabs (Atorvastatin calcium) .... Take 1 tablet by mouth once a day at bedtime 6)  Toprol Xl 25 Mg Xr24h-tab (Metoprolol succinate) .... Take 1 tablet by mouth once a day 7)  Multivitamins Tabs (Multiple vitamin) .... Once daily 8)  Vitamin D 1000 Unit Tabs (Cholecalciferol) .... Once daily 9)  Fish Oil Oil (Fish oil) .... Once daily 10)  Omeprazole 40 Mg  Cpdr (Omeprazole) .... Take one tablet 30-60 minutes before breakfast 11)  Plavix 75 Mg Tabs (Clopidogrel bisulfate) .... Take one daily 12)  Aspirin 81 Mg Tabs (Aspirin) .... Take one daily  Patient Instructions: 1)  you need your yearly eye exam for diabetes-do not forget to make your appt  2)  if ok with your cardiologist- please start at least 20 minutes of exercise per day  3)  labs today for diabetes  4)  schedule fasting lab in 6 mo and then follow up lipid/ast/alt/AIC /renal  5)  no change in medicines   Current Allergies (reviewed today): ! PENICILLIN ! TETRACYCLINE ! * STATINS

## 2010-07-12 NOTE — Progress Notes (Signed)
Summary: test results  Phone Note Call from Patient Call back at Home Phone [phone]   Caller: Patient Reason for Call: Lab or Test Results Summary of Call: stress test and lab work results Initial call taken by: Migdalia Dk,  June 25, 2009 2:59 PM  Follow-up for Phone Call        I spoke with the pt.  Follow-up by: Sherri Rad, RN, BSN,  June 25, 2009 3:15 PM

## 2010-07-12 NOTE — Assessment & Plan Note (Signed)
Summary: ROA 6 MTHS-TO REVIEW LABS CYD   Vital Signs:  Patient profile:   70 year old male Height:      70 inches Weight:      180.50 pounds BMI:     25.99 Temp:     97.9 degrees F oral Pulse rate:   60 / minute Pulse rhythm:   regular BP sitting:   116 / 64  (left arm) Cuff size:   regular  Vitals Entered By: Lewanda Rife LPN (February 18, 2010 8:23 AM) CC: six month f/u after labs   History of Present Illness: here for f/u of lipids and DM and HTN   wt is stable  bp is excellent  cholesterol is stable with LDL 68  K is 5.3 - a little high - no vitamins with K  no reason for that   cr is 1.4- higher than usual   AIC is 7.1- up a bit  when he tests it is up and down - better than what is was  checking once per day - am avg 120-125   ast 64-- ? more pain med takes hydrocodone- acet two times a day    back pain is bad  neurosurg is supposed to get set up with a pain clinic  will call them for appt  very little exercise -- but insurance started paying for silver sneakers again     Allergies: 1)  ! Penicillin 2)  ! Tetracycline 3)  ! * Statins  Past History:  Past Surgical History: Last updated: 06/10/2009 Rotator cuff repair Vasectomy Colonoscopy- polyps, hemorrhoids, AVM (06/2002) (12/09) colonoscopy - adenomatous polyp, AVM, - re check 5 years  hosp 9/10 with CAD/MI- cath at [facility]/ stents 12/10 admit CP and GI bleed (gastritis and colon avm) 12/10 colonoscopy- AVM 12/10 EGD - gastritis PTCA-Stent 02/2009  Family History: Last updated: 01/17/2007 mother CAD, DM sister CAD, HTN, renal failure 2 brothers DM brother pancreatic cancer aunts and uncles-?ca brother with prostate cancer  Social History: Last updated: 04/29/2009 Quit smoking with MI 02/2009 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 Likes to [employer] but limited by back Occasional ETOH Married for 49 years  Risk Factors: Smoking Status: current (09/18/2006)  Past Medical  History: Current Problems:  HYPERLIPIDEMIA (ICD-272.4) C A D (ICD-414.00) 9/10 STEMI circ BMS at [facility] with 70% prox RCA/ ARTHRITIS, 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 Eyes:  Denies blurring and eye irritation. CV:  Denies chest pain or discomfort, lightheadness, palpitations, and shortness of breath with exertion. Resp:  Denies cough, shortness of breath, and wheezing. GI:  Denies abdominal pain, bloody stools, change in bowel habits, and indigestion. GU:  Denies urinary frequency. MS:  Complains of low back pain, mid back pain, and stiffness; denies muscle weakness. Derm:  Denies itching, lesion(s), poor wound healing, and rash. Neuro:  Denies numbness, tingling, and weakness. Psych:  mood is ok . Endo:  Denies excessive thirst and excessive urination. Heme:  Denies abnormal bruising and bleeding.  Physical Exam  General:  Well-developed,well-nourished,in no acute distress; alert,appropriate and cooperative throughout examination Head:  normocephalic, atraumatic, and no abnormalities observed.   Eyes:  vision grossly intact, pupils equal, pupils round, and pupils reactive to light.  no conjunctival pallor, injection or icterus  Ears:  R ear normal and L ear normal.   Mouth:  pharynx pink and moist.   Neck:  supple with full rom and no masses or thyromegally, no JVD or carotid bruit  Chest Wall:  No deformities, masses, tenderness or gynecomastia noted. Lungs:  diffusely distant bs no rales/crackles or wheeze nl exp phase Heart:  Normal rate and regular rhythm. S1 and S2  normal without gallop, murmur, click, rub or other extra sounds. Abdomen:  Bowel sounds positive,abdomen soft and non-tender without masses, organomegaly or hernias noted. no renal bruits  Msk:  No deformity or scoliosis noted of thoracic or lumbar spine.  no acute joint changes  Pulses:  R and L carotid,radial,femoral,dorsalis pedis and posterior tibial pulses are full and equal bilaterally Extremities:  No clubbing, cyanosis, edema, or deformity noted with normal full range of motion of all joints.   Neurologic:  sensation intact to light touch, gait normal, and DTRs symmetrical and normal.   Skin:  Intact without suspicious lesions or rashes Cervical Nodes:  No lymphadenopathy noted Inguinal Nodes:  No significant adenopathy Psych:  normal affect, talkative and pleasant   Diabetes Management Exam:    Foot Exam (with socks and/or shoes not present):       Sensory-Pinprick/Light touch:          Left medial foot (L-4): normal          Left dorsal foot (L-5): normal          Left lateral foot (S-1): normal          Right medial foot (L-4): normal          Right dorsal foot (L-5): normal          Right lateral foot (S-1): normal       Sensory-Monofilament:          Left foot: normal          Right foot: normal       Inspection:          Left foot: normal          Right foot: normal       Nails:          Left foot: normal          Right foot: normal   Impression & Recommendations:  Problem # 1:  ESSENTIAL HYPERTENSION, BENIGN (ICD-401.1) Assessment Unchanged  this is in very good control  continue toprol rev labs  His updated medication list for this problem includes:    Toprol Xl 25 Mg Xr24h-tab (Metoprolol succinate) .Marland Kitchen... Take 1 tablet by mouth once a day  BP today: 116/64 Prior BP: 115/60 (09/13/2009)  Labs Reviewed: K+: 5.3 (02/15/2010) Creat: : 1.4 (02/15/2010)   Chol: 114 (02/15/2010)   HDL: 25.90 (02/15/2010)   LDL: 68 (02/15/2010)   TG: 100.0  (02/15/2010)  Problem # 2:  ANEMIA-UNSPECIFIED (ICD-285.9) Assessment: Improved  will check at next labs in 3 mo  no symptoms of any further gi bleed  Hgb: 11.1 (07/30/2009)   Hct: 32.9 (07/30/2009)   Platelets: 183.0 (07/30/2009) RBC: 3.38 (07/30/2009)   RDW: 15.2 (07/30/2009)   WBC: 5.9 (07/30/2009) MCV: 97.4 (07/30/2009)   MCHC: 33.7 (07/30/2009)  Problem # 3:  HYPERLIPIDEMIA (ICD-272.4) Assessment: Unchanged  this is stable with low HDL  pt is being tx with max tx for CAD he is having  some aches and pains- ? rel to lipitor in light of CAD- I adv him to call his cardiologist to see if med needs to be adj or changed rev lab today rev low sat fat diet  His updated medication list for this problem includes:    Lipitor 80 Mg Tabs (Atorvastatin calcium) .Marland Kitchen... Take 1 tablet by mouth once a day at bedtime  Labs Reviewed: SGOT: 64 (02/15/2010)   SGPT: 38 (02/15/2010)   HDL:25.90 (02/15/2010), 22.00 (03/30/2009)  LDL:68 (02/15/2010), 64 (03/30/2009)  Chol:114 (02/15/2010), 101 (03/30/2009)  Trig:100.0 (02/15/2010), 73.0 (03/30/2009)  Problem # 4:  DIABETES MELLITUS, TYPE II (ICD-250.00) Assessment: Deteriorated  this is worse rev diet--disc healthy diet (low simple sugar/ choose complex carbs/ low sat fat) diet and exercise in detail start exercise as tol 5 d per week  send two times a day sugars in 2 weeks consider metformin -- but concerned about cr of 1.4 which is new enc water intake His updated medication list for this problem includes:    Aspirin 81 Mg Tabs (Aspirin) .Marland Kitchen... Take one daily  Labs Reviewed: Creat: 1.4 (02/15/2010)     Last Eye Exam: normal (06/12/2008) Reviewed HgBA1c results: 7.1 (02/15/2010)  7.0 (08/13/2009)  Problem # 5:  HYPERKALEMIA (ICD-276.7) Assessment: New new - ? if lab error will cut high K foods and re check 2 wk  Problem # 6:  DEGENERATIVE DISC DISEASE (ICD-722.6) Assessment: Deteriorated with fairly severe spinal stenosis  to be ref  to pain clinic soon  [facility] inc may be from pain med  did handicapped sticker applic for him today  Complete Medication List: 1)  Methotrexate 17.5 Mg Tabs (methotrexate Sodium)  .... Weekly 2)  Hydrocodone-acetaminophen 5-500 Mg Tabs (Hydrocodone-acetaminophen) .Marland Kitchen.. 1 by mouth two times a day 3)  Diabetic Test Strips and Lancets For Contour Meter  .... Check glucose two times a day and as needed for dm 250.0 that is not adequately controlled 4)  Nitrostat 0.4 Mg Subl (Nitroglycerin) .... Place under tongue every 5 min as needed 5)  Lipitor 80 Mg Tabs (Atorvastatin calcium) .... Take 1 tablet by mouth once a day at bedtime 6)  Toprol Xl 25 Mg Xr24h-tab (Metoprolol succinate) .... Take 1 tablet by mouth once a day 7)  Multivitamins Tabs (Multiple vitamin) .... Once daily 8)  Vitamin D 1000 Unit Tabs (Cholecalciferol) .... Once daily 9)  Fish Oil Oil (Fish oil) .... Once daily 10)  Omeprazole 40 Mg Cpdr (Omeprazole) .... Take one tablet 30-60 minutes before breakfast 11)  Plavix 75 Mg Tabs (Clopidogrel bisulfate) .... Take one daily 12)  Aspirin 81 Mg Tabs (Aspirin) .... Take one daily  Patient Instructions: 1)  start checking sugar in am and then 2 hours after afternoon meal and send me report in 2 week (mail or fax or drop it by)  2)  for exercise -stationary bike may be your best best  3)  want to aim for 20 or more minutes 5 days per week- that will help his sugar  4)  avoid vitamins with potassium  5)  generally avoid bananas and orange juice and cantelope - as these are hight in potassium  6)  re check potassium level in 2 weeks -- for hyperkalemia  7)  call your cardiology office and let them know that you are having some muscle aches -- ? from lipitor  8)  schedule lab and follow up in 3 months -- AIC , renal 250.0, cbc with diff for anemia  Current Allergies (reviewed today): ! PENICILLIN ! TETRACYCLINE ! * STATINS

## 2010-07-12 NOTE — Progress Notes (Signed)
Summary: form for diabetic supplies  Phone Note From Pharmacy   Caller: [facility] Summary of Call: Form for diabetic supplies is on your shelf. Initial call taken by: Lowella Petties CMA,  July 28, 2009 8:27 AM  Follow-up for Phone Call        form done and in nurse in box  Follow-up by: Judith Part MD,  July 28, 2009 6:08 PM  Additional Follow-up for Phone Call Additional follow up Details #1::        completed form faxed to [phone] as instructed.Lewanda Rife LPN  July 29, 2009 9:10 AM

## 2010-07-12 NOTE — Letter (Signed)
Summary: [facility]  [facility]   Imported By: Maryln Gottron 01/28/2010 11:00:23  _____________________________________________________________________  External Attachment:    Type:   Image     Comment:   External Document

## 2010-07-12 NOTE — Letter (Signed)
Summary: [facility]  [facility]   Imported By: Lanelle Bal 08/09/2009 11:18:35  _____________________________________________________________________  External Attachment:    Type:   Image     Comment:   External Document

## 2010-07-12 NOTE — Miscellaneous (Signed)
Summary: BAYER CONTOUR TEST  STRP  Clinical Lists Changes  Medications: Removed medication of * DIABETIC TEST STRIPS AND LANCETS FOR CONTOUR METER check glucose two times a day and as needed for DM 250.0 that is not adequately controlled Added new medication of BAYER CONTOUR TEST  STRP (GLUCOSE BLOOD) dx:250.000 use as directed two times a day - Signed Rx of BAYER CONTOUR TEST  STRP (GLUCOSE BLOOD) dx:250.000 use as directed two times a day;  #100 x 12;  Signed;  Entered by: Mervin Hack CMA (AAMA);  Authorized by: Judith Part MD;  Method used: [facility] [phone]*, [address] [place], [place], [place]  [id number], Ph: [phone] or [phone], Fax: [phone]    Prescriptions: BAYER CONTOUR TEST  STRP (GLUCOSE BLOOD) dx:250.000 use as directed two times a day  #100 x 12   Entered by:   Mervin Hack CMA (AAMA)   Authorized by:   Judith Part MD   Signed by:   Mervin Hack CMA (AAMA) on 03/11/2010   Method used:   [facility]        [facility]  [address] [phone]* (retail)       [address]       [place], [place]  [id number]       Ph: [phone] or [phone]       Fax: [phone]   RxID:   [id number]

## 2010-07-12 NOTE — Assessment & Plan Note (Signed)
Summary: F5M/DM   Primary Provider:  Roxy Manns, MD  CC:  sob.Marland KitchenMarland KitchenMarland KitchenMarland Kitchenpt states he also has been having cramps in legs, arms, and hands his arthritis MD states its from his lipitor would like to switch.  History of Present Illness: Curtis Frazier is seen today post hospital D/C.  He was admitted by [facility] for SSCP.  He has a BMS in his circ from [facility] in 02/2009.  It turns out he had a significant GI bleed from a likely AVM>  His Hb decreased into the 7 range and he required multiple tranfusions. He has recovered from this.  He had a normal myovue in January with no ischemia and normal LV The dust seems to have settled in regard to his other medical problems.  His gi bleed has subsided with no melena, his sinus infection has cleared.  He still has a bad back and may need lumbar spine surgery with Dr Lovell Sheehan.  He is cleared to have this within the next year and Plavix can be stopped 5-7 days before.  He denies SSCP, palpitations, edema or syncope  his lipitor is giving him pains and he has not tolerated simvastatin in the past.  We will try him on pravachol MWF at low dose    Current Medications (verified): 1)  Methotrexate 17.5 Mg Tabs (Methotrexate Sodium) .... Weekly 2)  Hydrocodone-Acetaminophen 5-500 Mg Tabs (Hydrocodone-Acetaminophen) .Marland Kitchen.. 1 By Mouth Two Times A Day 3)  Nitrostat 0.4 Mg Subl (Nitroglycerin) .... Place Under Tongue Every 5 Min As Needed 4)  Lipitor 80 Mg Tabs (Atorvastatin Calcium) .... Take 1 Tablet By Mouth Once A Day At Bedtime 5)  Toprol Xl 25 Mg Xr24h-Tab (Metoprolol Succinate) .... Take 1 Tablet By Mouth Once A Day 6)  Multivitamins   Tabs (Multiple Vitamin) .... Once Daily 7)  Vitamin D 1000 Unit  Tabs (Cholecalciferol) .... Once Daily 8)  Fish Oil   Oil (Fish Oil) .... Once Daily 9)  Omeprazole 40 Mg Cpdr (Omeprazole) .... Take One Tablet 30-60 Minutes Before Breakfast 10)  Plavix 75 Mg Tabs (Clopidogrel Bisulfate) .... Take One Daily 11)  Aspirin 81 Mg  Tabs (Aspirin) .... Take One  Daily 12)  Bayer Contour Test  Strp (Glucose Blood) .... Dx:250.000 Use As Directed Two Times A Day 13)  Folic Acid  (Folic Acid) .Marland Kitchen.. 1 Tab By Mouth Once Daily  Allergies: 1)  ! Penicillin 2)  ! Tetracycline 3)  ! 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) PSORIATIC ARTHRITIS (ICD-696.0) PSORIASIS (ICD-696.1) SCOLIOSIS (ICD-737.30) DEGENERATIVE DISC DISEASE (ICD-722.6) DIABETES MELLITUS, TYPE II (ICD-250.00) GERD (ICD-530.81) GI BLEED 12/10- CECAL AVM GASTRITIS 12/10           rhem- Beakman  cardiol- Nishan  Past Surgical History: Last updated: 06/10/2009 Rotator cuff repair Vasectomy Colonoscopy- polyps, hemorrhoids, AVM (06/2002) (12/09) colonoscopy - adenomatous polyp, AVM, - re check 5 years  hosp 9/10 with CAD/MI- cath at [facility]/ stents 12/10 admit CP and GI bleed (gastritis and colon avm) 12/10 colonoscopy- AVM 12/10 EGD - gastritis PTCA-Stent 02/2009  Family History: Last updated: 01/17/2007 mother CAD, DM sister CAD, HTN, renal failure 2 brothers DM brother pancreatic cancer aunts and uncles-?ca brother with prostate cancer  Social History: Last updated: 04/29/2009 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   Vital Signs:  Patient profile:   70 year old male Height:      70 inches Weight:      180 pounds BMI:     25.92 Pulse rate:   57 /  minute Resp:     12 per minute BP sitting:   113 / 63  (left arm)  Vitals Entered By: Kem Parkinson (March 17, 2010 10:01 AM)  Physical Exam  General:  Affect appropriate Healthy:  appears stated age HEENT: normal Neck supple with no adenopathy JVP normal bilateral bruits no thyromegaly Lungs clear with no wheezing and good diaphragmatic motion Heart:  S1/S2 no murmur,rub, gallop or click PMI normal Abdomen: benighn, BS positve, no tenderness, no AAA no bruit.  No HSM or HJR Distal pulses intact with no bruits No edema Neuro non-focal Skin warm and dry    Impression & Recommendations:  Problem # 1:  ESSENTIAL HYPERTENSION, BENIGN (ICD-401.1) Well controlled His updated medication list for this problem includes:    Toprol Xl 25 Mg Xr24h-tab (Metoprolol succinate) .Marland Kitchen... Take 1 tablet by mouth once a day    Aspirin 81 Mg Tabs (Aspirin) .Marland Kitchen... Take one daily  Problem # 2:  CAROTID BRUIT (ICD-785.9) F/U coarotid duplex.  On dual antiplatlet Rx for CAD Orders: Carotid Duplex (Carotid Duplex)  Problem # 3:  C A D (ICD-414.00) Stable previous stent LAD.  No porblems off Plavix for GI bleed.  Ok to have back surgery and come off Plavix if needed His updated medication list for this problem includes:    Nitrostat 0.4 Mg Subl (Nitroglycerin) .Marland Kitchen... Place under tongue every 5 min as needed    Toprol Xl 25 Mg Xr24h-tab (Metoprolol succinate) .Marland Kitchen... Take 1 tablet by mouth once a day    Plavix 75 Mg Tabs (Clopidogrel bisulfate) .Marland Kitchen... Take one daily    Aspirin 81 Mg Tabs (Aspirin) .Marland Kitchen... Take one daily  Problem # 4:  HYPERLIPIDEMIA (ICD-272.4)  With vascular disease needs to be on statin.  Try Pravachol 3x/week low dose.  Coenzyme Q to be added His updated medication list for this problem includes:    Lipitor 80 Mg Tabs (Atorvastatin calcium) .Marland Kitchen... Take 1 tablet by mouth once a day at bedtime  His updated medication list for this problem includes:    Pravastatin Sodium 10 Mg  Tabs (Pravastatin sodium) .Marland Kitchen... 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  Can stop Plavix if needed as stent to LAD was in 2007.  F/U Hb/Hct with prmary  Patient Instructions: 1)  Your physician recommends that you schedule a follow-up appointment in: 6 MONTHS WITH DR Eden Emms 2)  Your physician has recommended you make the following change in your medication: STOP LIPITOR 3)  START PRAVASTATIN 10 MG once daily 4)  Your physician has requested that you have a carotid duplex. This test is an ultrasound of the carotid arteries in your neck. It looks at blood flow through these arteries that supply the brain with blood. Allow one hour for this exam. There are no restrictions or special instructions. Prescriptions: PRAVASTATIN SODIUM 10 MG TABS (PRAVASTATIN SODIUM) 1 once daily  #30 x 6   Entered by:  Scherrie Bateman, LPN   Authorized by:   Colon Branch, MD, [facility]   Signed by:   Scherrie Bateman, LPN on 16/03/9603   Method used:   [facility]        [facility] [address] [phone]* (retail)       [address]       [place], [place]  [address]       Ph: [phone] or [phone]       Fax: [phone]   RxID:   [id number]   Prevention & Chronic Care Immunizations   Influenza vaccine: Fluvax 3+  (03/09/2009)    Tetanus booster: 08/03/2004: Td    Pneumococcal vaccine: Pneumovax  (01/17/2007)    H. zoster vaccine: Not documented  Colorectal Screening   Hemoccult: Negative  (08/17/2004)    Colonoscopy: DONE  (05/24/2009)   Colonoscopy due: 05/2013  Other Screening   PSA: 0.63  (04/19/2007)   Smoking status: current  (09/18/2006)   Smoking cessation counseling: yes  (10/12/2006)  Diabetes Mellitus   HgbA1C: 7.1  (02/15/2010)    Eye exam: normal  (06/12/2008)   Eye exam due: 06/2009    Foot exam: yes  (02/18/2010)   High risk foot: Not documented   Foot care education: Not documented    Urine  microalbumin/creatinine ratio: 14.3  (08/13/2009)  Lipids   Total Cholesterol: 114  (02/15/2010)   LDL: 68  (02/15/2010)   LDL Direct: 159.0  (04/29/2008)   HDL: 25.90  (02/15/2010)   Triglycerides: 100.0  (02/15/2010)    SGOT (AST): 64  (02/15/2010)   SGPT (ALT): 38  (02/15/2010)   Alkaline phosphatase: Not documented   Total bilirubin: Not documented  Hypertension   Last Blood Pressure: 113 / 63  (03/17/2010)   Serum creatinine: 1.4  (02/15/2010)   Serum potassium 4.7  (03/04/2010)  Self-Management Support :    Diabetes self-management support: Not documented    Hypertension self-management support: Not documented    Lipid self-management support: Not documented

## 2010-07-12 NOTE — Letter (Signed)
Summary: Return To Work  [employer], Main Office  [address]   [place], [place] [address]   Phone: [phone]  Fax: [phone]    07/05/2009  TO: Leodis Sias IT MAY CONCERN   RE: ALASTOR KNEALE [address] [place]   The above named individual is under my medical care and may return to work ON:[id number] 07-07-09 WITH NO RESTRICTIONS  If you have any further questions or need additional information, please call.     Sincerely,   Dr. Charlton Haws Deliah Goody, RN

## 2010-07-12 NOTE — Medication Information (Signed)
Summary: [facility]  [employer] [facility] [facility]   Imported By: Sherian Rein 03/24/2010 09:59:34  _____________________________________________________________________  External Attachment:    Type:   Image     Comment:   External Document

## 2010-07-12 NOTE — Assessment & Plan Note (Signed)
Summary: Cardiology Nuclear Study  Nuclear Med Background Indications for Stress Test: Evaluation for Ischemia, Post Hospital  Indications Comments: 05/25/09 (-) enzymes, /BNP  History: COPD, Heart Catheterization, Myocardial Infarction, Stents  History Comments: 02/27/09 MI: STEMI > Heart Cath > Stent: CFX ([facility])  Symptoms: Chest Pain, DOE, Palpitations, SOB    Nuclear Pre-Procedure Cardiac Risk Factors: Family History - CAD, History of Smoking, Lipids, NIDDM, Smoker Caffeine/Decaff Intake: None NPO After: 9:00 PM Lungs: clear IV 0.9% NS with Angio Cath: 20g     IV Site: (R) AC IV Started by: Irean Hong RN Chest Size (in) 42     Height (in): 70 Weight (lb): 166 BMI: 23.90 Tech Comments: Metoprolol 15 hrs ago.  Nuclear Med Study 1 or 2 day study:  1 day     Stress Test Type:  Eugenie Birks Reading MD:  Charlton Haws, MD     Referring MD:  P.Taevion Sikora Resting Radionuclide:  Technetium 4m Tetrofosmin     Resting Radionuclide Dose:  11.0 mCi  Stress Radionuclide:  Technetium 27m Tetrofosmin     Stress Radionuclide Dose:  33.0 mCi   Stress Protocol Exercise Time (min):  5:19 min     Max HR:  96 bpm     Predicted Max HR:  151 bpm  Max Systolic BP: 150 mm Hg     Percent Max HR:  63.58 %     METS: 7.0 Rate Pressure Product:  59563  Lexiscan: 0.4 mg   Stress Test Technologist:  Milana Na EMT-P     Nuclear Technologist:  Burna Mortimer Deal RT-N  Rest Procedure  Myocardial perfusion imaging was performed at rest 45 minutes following the intravenous administration of Myoview Technetium 58m Tetrofosmin.  Stress Procedure  The patient received IV Lexiscan 0.4 mg over 15-seconds.  Myoview injected at 30-seconds.  There were no significant changes and rare pvcs with infusion.  Quantitative spect images were obtained after a 45 minute delay.  QPS Raw Data Images:  Normal; no motion artifact; normal heart/lung ratio. Stress Images:  NI: Uniform and normal uptake of tracer in all myocardial  segments. Rest Images:  Normal homogeneous uptake in all areas of the myocardium. Subtraction (SDS):  Normal Transient Ischemic Dilatation:  .99  (Normal <1.22)  Lung/Heart Ratio:  .39  (Normal <0.45)  Quantitative Gated Spect Images QGS EDV:  106 ml QGS ESV:  30 ml QGS EF:  71 % QGS cine images:  normal  Findings Normal nuclear study      Overall Impression  Exercise Capacity: Lexiscan BP Response: Normal blood pressure response. Clinical Symptoms: Dyspnea ECG Impression: No significant ST segment change suggestive of ischemia. Overall Impression: Normal stress nuclear study. Overall Impression Comments: Diaphragmatic attenuation  Appended Document: Cardiology Nuclear Study Pt aware of results.

## 2010-07-12 NOTE — Assessment & Plan Note (Signed)
Summary: PER CHECK OUT/SF   Primary Provider:  Roxy Manns, MD  CC:  no compliants.  History of Present Illness: Curtis Frazier is seen today post hospital D/C.  He was admitted by [facility] for SSCP.  He has a BMS in his circ from [facility] in 02/2009.  It turns out he had a significant GI bleed from a likely AVM>  His Hb decreased into the 7 range and he required multiple tranfusions. He has recovered from this.  He had a normal myovue in January with no ischemia and normal LV The dust seems to have settled in regard to his other medical problems.  His gi bleed has subsided with no melena, his sinus infection has cleared.  He still has a bad back and may need lumbar spine surgery with Dr Lovell Sheehan.  He is cleared to have this within the next year and Plavix can be stopped 5-7 days before.  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 Weekly 2)  Hydrocodone-Acetaminophen 5-500 Mg Tabs (Hydrocodone-Acetaminophen) .Marland Kitchen.. 1 By Mouth Two Times A Day 3)  Diabetic Test Strips and Lancets For Contour Meter .... Check Glucose Two Times A Day and As Needed For Dm 250.0 That Is Not Adequately Controlled 4)  Nitrostat  0.4 Mg Subl (Nitroglycerin) .... Place Under Tongue Every 5 Min As Needed 5)  Lipitor 80 Mg Tabs (Atorvastatin Calcium) .... Take 1 Tablet By Mouth Once A Day At Bedtime 6)  Toprol Xl 25 Mg Xr24h-Tab (Metoprolol Succinate) .... Take 1 Tablet By Mouth Once A Day 7)  Multivitamins   Tabs (Multiple Vitamin) .... Once Daily 8)  Vitamin D 1000 Unit  Tabs (Cholecalciferol) .... Once Daily 9)  Fish Oil   Oil (Fish Oil) .... Once Daily 10)  Omeprazole 40 Mg Cpdr (Omeprazole) .... Take One Tablet 30-60 Minutes Before Breakfast 11)  Plavix 75 Mg Tabs (Clopidogrel Bisulfate) .... Take One Daily 12)  Aspirin 81 Mg  Tabs (Aspirin) .... Take One Daily  Allergies (verified): 1)  ! Penicillin 2)  ! Tetracycline 3)  ! * Statins  Past History:  Past Medical History: Last updated: 06/10/2009 Current Problems:  HYPERLIPIDEMIA (ICD-272.4) C A D (ICD-414.00) 9/10 STEMI circ BMS at [facility] with 70% prox RCA/ ARTHRITIS, RHEUMATOID (ICD-714.0) HX, PERSONAL, COLONIC POLYPS (ICD-V12.72) ACTINIC KERATOSIS (ICD-702.0) PSORIATIC ARTHRITIS (ICD-696.0) PSORIASIS (ICD-696.1) SCOLIOSIS (ICD-737.30) DEGENERATIVE DISC DISEASE (ICD-722.6) DIABETES MELLITUS, TYPE II (ICD-250.00) GERD (ICD-530.81) GI BLEED 12/10- CECAL AVM GASTRITIS 12/10           rhem- Rowe  cardiolEden Emms  Past Surgical History: Last updated: 06/10/2009 Rotator cuff repair Vasectomy Colonoscopy- polyps, hemorrhoids, AVM (06/2002) (12/09) colonoscopy - adenomatous polyp, AVM, - re check 5 years  hosp 9/10  with CAD/MI- cath at [facility]/ stents 12/10 admit CP and GI bleed (gastritis and colon avm) 12/10 colonoscopy- AVM 12/10 EGD - gastritis PTCA-Stent 02/2009  Family History: Last updated: 01/17/2007 mother CAD, DM sister CAD, HTN, renal failure 2 brothers DM brother pancreatic cancer aunts and uncles-?ca brother with prostate cancer  Social History: Last updated: 04/29/2009 Quit smoking with MI 02/2009 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   Vital Signs:  Patient profile:   70 year old male Height:      70 inches Weight:      180 pounds BMI:     25.92 Pulse rate:   62 / minute Resp:     12 per minute BP sitting:   115 / 60  (left arm)  Vitals Entered By: Kem Parkinson (September 13, 2009 9:17 AM)  Physical Exam  General:  Affect appropriate Healthy:  appears stated age HEENT: normal Neck supple with no adenopathy JVP normal no bruits no thyromegaly Lungs clear with no wheezing and good diaphragmatic motion Heart:  S1/S2 no murmur,rub, gallop or click PMI normal Abdomen: benighn, BS positve, no tenderness, no AAA no bruit.  No HSM or HJR Distal pulses intact with no bruits No edema Neuro non-focal Skin warm and dry    Impression & Recommendations:  Problem # 1:  C A D (ICD-414.00) Stent circ BMS 2010 [facility].  Normal myovue 06/2009   His updated medication list for this problem includes:    Nitrostat 0.4 Mg Subl (Nitroglycerin) .Marland Kitchen... Place under tongue every 5 min as needed    Toprol Xl 25 Mg Xr24h-tab (Metoprolol succinate) .Marland Kitchen... Take 1 tablet by mouth once a day    Plavix 75 Mg Tabs (Clopidogrel bisulfate) .Marland Kitchen... Take one daily    Aspirin 81 Mg Tabs (Aspirin) .Marland Kitchen... Take one daily  Problem # 2:  HYPERLIPIDEMIA (ICD-272.4) At goal with no side effects His updated medication list for this problem includes:    Lipitor 80 Mg Tabs (Atorvastatin calcium) .Marland Kitchen... Take 1 tablet by mouth once a day at bedtime  CHOL: 101 (03/30/2009)   LDL: 64 (03/30/2009)   HDL: 22.00 (03/30/2009)   TG: 73.0 (03/30/2009)  Problem # 3:  ANGIODYSPLASIA-INTESTINE (ICD-569.84) No recurrence of  gi bleed.  Has BMS so could stop Plavix in future if needed.  Problem # 4:  ESSENTIAL HYPERTENSION, BENIGN (ICD-401.1) Well controlled continue BB in light of CAD His updated medication list for this problem includes:    Toprol Xl 25 Mg Xr24h-tab (Metoprolol succinate) .Marland Kitchen... Take 1 tablet by mouth once a day    Aspirin 81 Mg Tabs (Aspirin) .Marland Kitchen... Take one daily

## 2010-07-14 NOTE — Assessment & Plan Note (Signed)
Summary: 3 MONTH FOLLOW UP/[facility]   Vital Signs:  Patient profile:   70 year old male Height:      70 inches Weight:      181.50 pounds BMI:     26.14 Temp:     97.8 degrees F oral Pulse rate:   56 / minute Pulse rhythm:   regular BP sitting:   116 / 68  (left arm) Cuff size:   regular  Vitals Entered By: Lewanda Rife LPN (May 27, 2010 8:23 AM) CC: three month f/u   History of Present Illness: here for f/u of DM and lipids and anemia   has been feeling great overall no complaints    wt is up 1 lb  bp stable at 116/68 (was low in past)  DM imp with AIC down from 7.1 to 6.7 is just watching diet-- not on any med  is staying away from sugar as a rule --cut back a whole lot  is eating better - and more regularly  is going to gym once per week   K is 5.4-- up again- asked to stop his mvi no bananas or high K foods    hb is 12 up from 11 -- good   non smoker still   did not tolerate lipitor-- muscle cramps now on pravastatin mwf -- and doing better     Allergies: 1)  ! Penicillin 2)  ! Tetracycline 3)  ! * Statins  Past History:  Past Medical History: Last updated: 02/18/2010 Current Problems:  HYPERLIPIDEMIA (ICD-272.4) C A D (ICD-414.00) 9/10 STEMI circ BMS at [facility] with 70% prox RCA/ ARTHRITIS, RHEUMATOID (ICD-714.0) HX, PERSONAL, COLONIC POLYPS (ICD-V12.72) ACTINIC KERATOSIS (ICD-702.0) PSORIATIC ARTHRITIS (ICD-696.0) PSORIASIS (ICD-696.1) SCOLIOSIS (ICD-737.30) DEGENERATIVE DISC DISEASE (ICD-722.6) DIABETES MELLITUS, TYPE II (ICD-250.00) GERD (ICD-530.81) GI BLEED 12/10- CECAL AVM GASTRITIS 12/10           rhem- Beakman  cardiol- Nishan  Past Surgical History: Last updated: 06/10/2009 Rotator cuff repair Vasectomy Colonoscopy- polyps, hemorrhoids, AVM (06/2002) (12/09) colonoscopy - adenomatous polyp, AVM, - re check 5 years  hosp 9/10 with CAD/MI- cath at [facility]/ stents 12/10 admit CP and GI bleed (gastritis and colon avm) 12/10  colonoscopy- AVM 12/10 EGD - gastritis PTCA-Stent 02/2009  Family History: Last updated: 01/17/2007 mother CAD, DM sister CAD, HTN, renal failure 2 brothers DM brother pancreatic cancer aunts and uncles-?ca brother with prostate cancer  Social History: Last updated: 04/29/2009 Quit 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 Eyes:  Denies blurring and eye irritation. CV:  Denies chest pain or discomfort, lightheadness, and palpitations. Resp:  Denies cough, shortness of breath, and wheezing. GI:  Denies abdominal pain, bloody stools, change in bowel habits, indigestion, and nausea. GU:  Denies urinary frequency. MS:  Denies muscle aches and cramps. Derm:  Denies itching, lesion(s), poor wound healing, and rash. Neuro:  Denies numbness and tremors. Psych:  mood is ok . Endo:  Denies cold intolerance, excessive thirst, excessive urination, and heat intolerance. Heme:  Denies abnormal bruising and bleeding.  Physical Exam  General:  Well-developed,well-nourished,in no acute distress; alert,appropriate and cooperative throughout examination Head:  normocephalic, atraumatic, and no abnormalities observed.   Eyes:  vision grossly intact, pupils equal, pupils round, and pupils reactive to light.  no conjunctival  pallor, injection or icterus  Mouth:  pharynx pink and moist.   Neck:  supple with full rom and no masses or thyromegally, no JVD or carotid bruit  Chest Wall:  No deformities, masses, tenderness or gynecomastia noted. Lungs:  diffusely distant bs no rales/crackles or wheeze nl exp phase Heart:  Normal rate and regular rhythm. S1 and S2 normal without gallop, murmur, click, rub or other extra sounds. Abdomen:  Bowel sounds positive,abdomen  soft and non-tender without masses, organomegaly or hernias noted. no renal bruits  Msk:  No deformity or scoliosis noted of thoracic or lumbar spine.  no acute joint changes  Pulses:  R and L carotid,radial,femoral,dorsalis pedis and posterior tibial pulses are full and equal bilaterally Extremities:  No clubbing, cyanosis, edema, or deformity noted with normal full range of motion of all joints.   Neurologic:  sensation intact to light touch, gait normal, and DTRs symmetrical and normal.   Skin:  Intact without suspicious lesions or rashes Cervical Nodes:  No lymphadenopathy noted Inguinal Nodes:  No significant adenopathy Psych:  normal affect, talkative and pleasant   Diabetes Management Exam:    Foot Exam (with socks and/or shoes not present):       Sensory-Pinprick/Light touch:          Left medial foot (L-4): normal          Left dorsal foot (L-5): normal          Left lateral foot (S-1): normal          Right medial foot (L-4): normal          Right dorsal foot (L-5): normal          Right lateral foot (S-1): normal       Sensory-Monofilament:          Left foot: normal          Right foot: normal       Inspection:          Left foot: normal          Right foot: normal       Nails:          Left foot: normal          Right foot: normal   Impression & Recommendations:  Problem # 1:  HYPERKALEMIA (ICD-276.7) Assessment Deteriorated ? etiol  did stop mvi no longer on ace re check today no symptoms Orders: Venipuncture (69629) TLB-Lipid Panel (80061-LIPID) TLB-ALT (SGPT) (84460-ALT) TLB-AST (SGOT) (84450-SGOT)  Problem # 2:  ESSENTIAL HYPERTENSION, BENIGN (ICD-401.1) Assessment: Unchanged  good control- no hypoglycemia  His updated medication list for this problem includes:    Toprol Xl 25 Mg Xr24h-tab (Metoprolol succinate) .Marland Kitchen... Take 1 tablet by mouth once a day  Orders: Venipuncture (52841) TLB-Lipid Panel (80061-LIPID) TLB-ALT (SGPT) (84460-ALT) TLB-AST  (SGOT) (84450-SGOT) TLB-Potassium (K+) (84132-K)  BP today: 116/68 Prior BP: 113/63 (03/17/2010)  Labs Reviewed: K+: 5.4 (05/20/2010) Creat: : 1.4 (05/20/2010)   Chol: 114 (02/15/2010)   HDL: 25.90 (02/15/2010)   LDL: 68 (02/15/2010)   TG: 100.0 (02/15/2010)  Problem # 3:  ANEMIA-UNSPECIFIED (ICD-285.9) Assessment: Improved  much improved with cessation of GI bleed  will continue to montitor  check and f/u 6 mo   Hgb: 12.0 (05/20/2010)   Hct: 35.0 (05/20/2010)   Platelets: 243.0 (05/20/2010) RBC: 3.55 (05/20/2010)   RDW: 15.0 (05/20/2010)   WBC: 6.0 (05/20/2010) MCV: 98.4 (05/20/2010)   MCHC: 34.4 (05/20/2010)  Problem # 4:  HYPERLIPIDEMIA (ICD-272.4)  Assessment: Unchanged  re check on new pravastatin and adv rev low sat fat diet- doing better His updated medication list for this problem includes:    Pravastatin Sodium 10 Mg Tabs (Pravastatin sodium) .Marland Kitchen... 1 tablet by mouth on monday, wednesday and friday.  Orders: Venipuncture (04540) TLB-Lipid Panel (80061-LIPID) TLB-ALT (SGPT) (84460-ALT) TLB-AST (SGOT) (84450-SGOT) TLB-Potassium (K+) (84132-K)  Labs Reviewed: SGOT: 64 (02/15/2010)   SGPT: 38 (02/15/2010)   HDL:25.90 (02/15/2010), 22.00 (03/30/2009)  LDL:68 (02/15/2010), 64 (03/30/2009)  Chol:114 (02/15/2010), 101 (03/30/2009)  Trig:100.0 (02/15/2010), 73.0 (03/30/2009)  Problem # 5:  DIABETES MELLITUS, TYPE II (ICD-250.00) Assessment: Improved  improved with better diet will grad inc exercise  disc low glycemic diet will make own opthy appt  good foot care   His updated medication list for this problem includes:    Aspirin 81 Mg Tabs (Aspirin) .Marland Kitchen... Take one daily  Labs Reviewed: Creat: 1.4 (05/20/2010)     Last Eye Exam: normal (06/12/2008) Reviewed HgBA1c results: 6.7 (05/20/2010)  7.1 (02/15/2010)  Complete Medication List: 1)  Methotrexate 17.5 Mg Tabs (methotrexate Sodium)  .... Weekly 2)  Hydrocodone-acetaminophen 5-500 Mg Tabs  (Hydrocodone-acetaminophen) .Marland Kitchen.. 1 by mouth two times a day 3)  Nitrostat 0.4 Mg Subl (Nitroglycerin) .... Place under tongue every 5 min as needed 4)  Pravastatin Sodium 10 Mg Tabs (Pravastatin sodium) .Marland Kitchen.. 1 tablet by mouth on monday, wednesday and friday. 5)  Toprol Xl 25 Mg Xr24h-tab (Metoprolol succinate) .... Take 1 tablet by mouth once a day 6)  Multivitamins Tabs (Multiple vitamin) .... Once daily 7)  Vitamin D 1000 Unit Tabs (Cholecalciferol) .... Once daily 8)  Fish Oil Oil (Fish oil) .... Once daily 9)  Omeprazole 40 Mg Cpdr (Omeprazole) .... Take one tablet 30-60 minutes before breakfast 10)  Plavix 75 Mg Tabs (Clopidogrel bisulfate) .... Take one daily 11)  Aspirin 81 Mg Tabs (Aspirin) .... Take one daily 12)  Bayer Contour Test Strp (Glucose blood) .... Dx:250.000 use as directed two times a day 13)  Folic Acid (folic Acid)  .Marland Kitchen.. 1 tab by mouth once daily  Patient Instructions: 1)  more labs today- cholesterol to see if med is working and also re check the potassium level  2)  keep watching diet  3)  It is important that you exercise reguarly at least 20 minutes 5 times a week. If you develop chest pain, have severe difficulty breathing, or feel very tired, stop exercising immediately and seek medical attention.  4)  schedule fasting lab and then check up in 6 months lipid/hepatic/renal / cbc with diff/ tsh / AIc for 250.0 and 272 and anemia  5)  don't forget to get your annual eye exam for diabetes    Orders Added: 1)  Venipuncture [36415] 2)  TLB-Lipid Panel [80061-LIPID] 3)  TLB-ALT (SGPT) [84460-ALT] 4)  TLB-AST (SGOT) [84450-SGOT] 5)  TLB-Potassium (K+) [84132-K] 6)  Est. Patient Level IV [98119]   Immunization History:  Influenza Immunization History:    Influenza:  [facility]ical [facility] ([facility]/11/2010)   Immunization History:  Influenza Immunization History:    Influenza:  Historical received at [facility] (04/11/2010)  Current Allergies (reviewed today): !  PENICILLIN ! TETRACYCLINE ! * STATINS

## 2010-07-14 NOTE — Miscellaneous (Signed)
Summary: Controlled Substance Agreement  Controlled Substance Agreement   Imported By: Lanelle Bal 06/03/2010 11:20:35  _____________________________________________________________________  External Attachment:    Type:   Image     Comment:   External Document

## 2010-07-22 ENCOUNTER — Encounter (INDEPENDENT_AMBULATORY_CARE_PROVIDER_SITE_OTHER): Payer: Self-pay | Admitting: *Deleted

## 2010-07-22 ENCOUNTER — Other Ambulatory Visit: Payer: Self-pay | Admitting: Family Medicine

## 2010-07-22 ENCOUNTER — Other Ambulatory Visit (INDEPENDENT_AMBULATORY_CARE_PROVIDER_SITE_OTHER): Payer: Medicare Other

## 2010-07-22 DIAGNOSIS — E785 Hyperlipidemia, unspecified: Secondary | ICD-10-CM

## 2010-07-22 LAB — ALT: ALT: 16 U/L (ref 0–53)

## 2010-07-22 LAB — AST: AST: 20 U/L (ref 0–37)

## 2010-07-22 LAB — LIPID PANEL
Cholesterol: 163 mg/dL (ref 0–200)
LDL Cholesterol: 113 mg/dL — ABNORMAL HIGH (ref 0–99)
Total CHOL/HDL Ratio: 5

## 2010-07-29 ENCOUNTER — Encounter: Payer: Self-pay | Admitting: Family Medicine

## 2010-08-02 ENCOUNTER — Other Ambulatory Visit: Payer: Self-pay | Admitting: Neurosurgery

## 2010-08-02 DIAGNOSIS — M549 Dorsalgia, unspecified: Secondary | ICD-10-CM

## 2010-08-03 ENCOUNTER — Encounter: Payer: Self-pay | Admitting: Family Medicine

## 2010-08-03 ENCOUNTER — Encounter (INDEPENDENT_AMBULATORY_CARE_PROVIDER_SITE_OTHER): Payer: Medicare Other | Admitting: Family Medicine

## 2010-08-03 DIAGNOSIS — E785 Hyperlipidemia, unspecified: Secondary | ICD-10-CM

## 2010-08-03 DIAGNOSIS — Z0110 Encounter for examination of ears and hearing without abnormal findings: Secondary | ICD-10-CM

## 2010-08-03 DIAGNOSIS — I2510 Atherosclerotic heart disease of native coronary artery without angina pectoris: Secondary | ICD-10-CM

## 2010-08-03 DIAGNOSIS — Z0289 Encounter for other administrative examinations: Secondary | ICD-10-CM

## 2010-08-03 DIAGNOSIS — E119 Type 2 diabetes mellitus without complications: Secondary | ICD-10-CM

## 2010-08-03 DIAGNOSIS — E875 Hyperkalemia: Secondary | ICD-10-CM

## 2010-08-03 DIAGNOSIS — I10 Essential (primary) hypertension: Secondary | ICD-10-CM

## 2010-08-03 DIAGNOSIS — Z0100 Encounter for examination of eyes and vision without abnormal findings: Secondary | ICD-10-CM

## 2010-08-03 LAB — CONVERTED CEMR LAB
Bacteria, UA: 0
Blood in Urine, dipstick: NEGATIVE
Glucose, Urine, Semiquant: NEGATIVE
Ketones, urine, test strip: NEGATIVE
RBC / HPF: 0

## 2010-08-06 ENCOUNTER — Ambulatory Visit
Admission: RE | Admit: 2010-08-06 | Discharge: 2010-08-06 | Disposition: A | Discharge status: Home / Self Care | Payer: Medicare Other | Source: Ambulatory Visit | Attending: Neurosurgery | Admitting: Neurosurgery

## 2010-08-06 DIAGNOSIS — M549 Dorsalgia, unspecified: Secondary | ICD-10-CM

## 2010-08-08 ENCOUNTER — Telehealth: Payer: Self-pay | Admitting: Family Medicine

## 2010-08-10 ENCOUNTER — Ambulatory Visit: Payer: Medicare Other

## 2010-08-10 ENCOUNTER — Encounter: Payer: Self-pay | Admitting: Family Medicine

## 2010-08-10 DIAGNOSIS — Z0110 Encounter for examination of ears and hearing without abnormal findings: Secondary | ICD-10-CM

## 2010-08-18 NOTE — Letter (Signed)
Summary: [facility]  [facility]   Imported By: Kassie Mends 08/10/2010 08:21:50  _____________________________________________________________________  External Attachment:    Type:   Image     Comment:   External Document

## 2010-08-18 NOTE — Assessment & Plan Note (Signed)
Summary: DOT CPE that has to be done by March 15th Per Celso Sickle   Vital Signs:  Patient profile:   70 year old male Height:      70 inches Weight:      181.25 pounds BMI:     26.10 Temp:     97.8 degrees F oral Pulse rate:   64 / minute Pulse rhythm:   regular BP sitting:   130 / 64  (left arm) Cuff size:   regular  Vitals Entered By: Delilah Shan CMA Duncan Dull) (August 03, 2010 8:24 AM) CC: DOT CPE  Vision Screening:Left eye with correction: 20 / 20 Right eye with correction: 20 / 20 Both eyes with correction: 20 / 20        Vision Entered By: Delilah Shan CMA Duncan Dull) (August 03, 2010 8:26 AM)  Hearing Screen 25db HL: Left  500 hz: 25db 1000 hz: 25db 2000 hz: No Response 4000 hz: No Response Right  500 hz: 25db 1000 hz: 25db 2000 hz: No Response 4000 hz: No Response    History of Present Illness: 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 for vision   appt next wed for his eye exam - Dr Hazle Quant    wt is stable with bmi of 26  still non smoker-- doing well with that   130/64--no hypotension at all   Td 06   hx of CAD and carotid bruit followed closely followed by cardiology closely  no angina at all and no sob  due for cardiology follow up in april - wants to go ahead and schedule this with Dr Eden Emms   CHOL is still high with LDL 120s   DM is diet controlled - is acceptible  also no hypoglycemia  no hypoglycemia at all  vision- with glasses   ua - will give on the way out   hearing - no problems that he recognizes   will continue to follow back pain- MRI due on sat - is about the same - does not affect his driving  has lumbar support and that helps on hydrocodone low potency- no sleepiness with that at all   doing ok on pravachol 10 -- no muscle pain has muscle pain on zocor and lipitor open to trying 20 mg     Allergies: 1)  ! Penicillin 2)  !  Tetracycline 3)  ! 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) PSORIATIC ARTHRITIS (ICD-696.0) PSORIASIS (ICD-696.1) SCOLIOSIS (ICD-737.30) DEGENERATIVE DISC DISEASE (ICD-722.6) DIABETES MELLITUS, TYPE II (ICD-250.00) GERD (ICD-530.81) GI BLEED 12/10- CECAL AVM GASTRITIS 12/10           rhem- Beakman  cardiol- Nishan  Past Surgical History: Last updated: 06/10/2009 Rotator cuff repair Vasectomy Colonoscopy- polyps, hemorrhoids, AVM (06/2002) (12/09) colonoscopy - adenomatous polyp, AVM, - re check 5 years  hosp 9/10 with CAD/MI- cath at [facility]/ stents 12/10 admit CP and GI bleed (gastritis and colon avm) 12/10 colonoscopy- AVM 12/10 EGD - gastritis PTCA-Stent 02/2009  Family History: Last updated: 01/17/2007 mother CAD, DM sister CAD, HTN, renal failure 2 brothers DM brother pancreatic cancer aunts and uncles-?ca brother with prostate cancer  Social History: Last updated: 04/29/2009 Quit smoking with MI 02/2009  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 Eyes:  Denies blurring, eye irritation, and eye pain. CV:  Denies chest pain or discomfort, lightheadness, palpitations, and shortness of breath with exertion. Resp:  Denies cough, shortness of breath, and wheezing. GI:  Denies abdominal pain, change in bowel habits, indigestion, and nausea. GU:  Denies dysuria and urinary frequency. MS:  Denies joint pain, joint redness, joint swelling, and stiffness. Derm:  Denies itching, lesion(s), poor wound  healing, and rash. Neuro:  Denies numbness and tingling. Psych:  Denies anxiety and depression. Endo:  Denies cold intolerance, excessive thirst, excessive urination, and heat intolerance. Heme:  Denies abnormal bruising and bleeding.  Physical Exam  General:  Well-developed,well-nourished,in no acute distress; alert,appropriate and cooperative throughout examination Head:  normocephalic, atraumatic, and no abnormalities observed.   Eyes:  vision grossly intact, pupils equal, pupils round, and pupils reactive to light.  no conjunctival pallor, injection or icterus  Ears:  R ear normal and L ear normal.  - hearing nl  hears whisper at 15 ft  Nose:  no nasal discharge.   Mouth:  pharynx pink and moist.   Neck:  supple with full rom and no masses or thyromegally, no JVD or carotid bruit  Chest Wall:  No deformities, masses, tenderness or gynecomastia noted. Lungs:  diffusely distant bs no rales/crackles or wheeze nl exp phase Heart:  RRR with M at LSB systolic no gallops  Abdomen:  Bowel sounds positive,abdomen soft and non-tender without masses, organomegaly or hernias noted. no renal bruits  Msk:  No deformity or scoliosis noted of thoracic or lumbar spine.  no acute joint changes  Pulses:  R and L carotid,radial,femoral,dorsalis pedis and posterior tibial pulses are full and equal bilaterally Extremities:  No clubbing, cyanosis, edema, or deformity noted with normal full range of motion of all joints.   Neurologic:  sensation intact to light touch, gait normal, and DTRs symmetrical and normal.   Skin:  Intact without suspicious lesions or rashes Cervical Nodes:  No lymphadenopathy noted Inguinal Nodes:  No significant adenopathy Psych:  normal affect, talkative and pleasant   Diabetes Management Exam:    Foot Exam (with socks and/or shoes not present):       Sensory-Pinprick/Light touch:          Left medial foot (L-4): normal          Left dorsal foot (L-5): normal          Left  lateral foot (S-1): normal          Right medial foot (L-4): normal          Right dorsal foot (L-5): normal          Right lateral foot (S-1): normal       Sensory-Monofilament:          Left foot: normal          Right foot: normal       Inspection:          Left foot: normal          Right foot: normal       Nails:          Left foot: normal          Right foot: normal   Impression &  Recommendations:  Problem # 1:  OTH GENERAL MEDICAL EXAMINATION ADMIN PURPOSES (ICD-V70.3) Assessment Comment Only  needs to repeat hearing eval for DOT certificatoin  no hypoglycemia  CAD in good control  is on narcotic pain med for back - that was choosen as least sedating and pt states absolutely no sedation with this   Orders: UA Dipstick W/ Micro (manual) (16109) Audiometry (60454) Vision Screening (09811) Work-Related DOT (91478) Prescription Created Electronically [phone]) Vision Screening [phone]) Audiometry [phone]) UA Dipstick w/o Micro (manual) (96295) Work-Related DOT (28413)  Problem # 2:  HYPERKALEMIA (ICD-276.7) Assessment: Improved  re check at next labs- 6 weeks (had improved ) off ace now   Orders: Work-Related DOT (24401)  Problem # 3:  ESSENTIAL HYPERTENSION, BENIGN (ICD-401.1) Assessment: Unchanged  HTN is in good control - no problems  in past hypotension with ace and that was stopped  no further problems  His updated medication list for this problem includes:    Toprol Xl 25 Mg Xr24h-tab (Metoprolol succinate) .Marland Kitchen... Take 1 tablet by mouth once a day  BP today: 130/64 Prior BP: 116/68 (05/27/2010)  Labs Reviewed: K+: 5.0 (05/27/2010) Creat: : 1.4 (05/20/2010)   Chol: 163 (07/22/2010)   HDL: 32.00 (07/22/2010)   LDL: 113 (07/22/2010)   TG: 92.0 (07/22/2010)  Orders: Work-Related DOT (02725)  Problem # 4:  HYPERLIPIDEMIA (ICD-272.4) Assessment: Deteriorated  this is not optiman on very low dose pravastatin  pt is willing to try 20 mg dose and update  me if any problems is intol to lipitor and zocor due to muscle pain  lab 6 weeks  rev old labs with pt  rev low sat fat diet  His updated medication list for this problem includes:    Pravastatin Sodium 10 Mg Tabs (Pravastatin sodium) .Marland Kitchen... 2  tablets by mouth once daily  Labs Reviewed: SGOT: 20 (07/22/2010)   SGPT: 16 (07/22/2010)   HDL:32.00 (07/22/2010), 34.40 (05/27/2010)  LDL:113 (07/22/2010), 146 (05/27/2010)  Chol:163 (07/22/2010), 197 (05/27/2010)  Trig:92.0 (07/22/2010), 84.0 (05/27/2010)  Orders: Prescription Created Electronically [phone])  Problem # 5:  C A D (ICD-414.00) Assessment: Unchanged disc goal of LDL under 70 if poss  is due for f/u with Dr Eden Emms in april for 6 mo return  will make that appt for him  also follows his M and carotid bruit  pt is asymptomatic / feeling good and still smoke free His updated medication list for this problem includes:    Nitrostat 0.4 Mg Subl (Nitroglycerin) .Marland Kitchen... Place under tongue every 5 min as needed    Toprol Xl 25 Mg Xr24h-tab (Metoprolol succinate) .Marland Kitchen... Take 1 tablet by mouth once a day    Plavix 75 Mg Tabs (Clopidogrel bisulfate) .Marland Kitchen... Take one daily    Aspirin 81 Mg Tabs (Aspirin) .Marland Kitchen... Take one daily  Orders: Cardiology Referral (Cardiology) Work-Related DOT [phone])  Problem # 6:  DIABETES MELLITUS, TYPE II (ICD-250.00) Assessment: Unchanged  this is well controlled with diet rev last labs rev low glycemic diet  cannot have ace or arb due to hypotension eye exam is next wed  labs 6 wk incl AIC and microalb His updated medication list for this problem includes:    Aspirin 81 Mg Tabs (Aspirin) .Marland Kitchen... Take one daily  Labs Reviewed: Creat: 1.4 (05/20/2010)     Last Eye Exam: normal (06/12/2008) Reviewed HgBA1c results: 6.7 (05/20/2010)  7.1 (02/15/2010)  Orders: Work-Related DOT (25956)  Complete Medication List: 1)  Methotrexate 17.5 Mg Tabs (methotrexate Sodium)  .... Weekly 2)  Hydrocodone-acetaminophen  5-500 Mg Tabs (Hydrocodone-acetaminophen) .Marland Kitchen.. 1 by mouth two times a day 3)  Nitrostat 0.4 Mg Subl (Nitroglycerin) .... Place under tongue every 5 min as needed 4)  Pravastatin Sodium 10 Mg Tabs (Pravastatin sodium) .... 2  tablets by mouth once daily 5)  Toprol Xl 25 Mg Xr24h-tab (Metoprolol succinate) .... Take 1 tablet by mouth once a day 6)  Multivitamins Tabs (Multiple vitamin) .... Once daily 7)  Vitamin D 1000 Unit Tabs (Cholecalciferol) .... Once daily 8)  Fish Oil Oil (Fish oil) .... Once daily 9)  Omeprazole 40 Mg Cpdr (Omeprazole) .... Take one tablet 30-60 minutes before breakfast 10)  Plavix 75 Mg Tabs (Clopidogrel bisulfate) .... Take one daily 11)  Aspirin 81 Mg Tabs (Aspirin) .... Take one daily 12)  Bayer Contour Test Strp (Glucose blood) .... Dx:250.000 use as directed two times a day 13)  Folic Acid (folic Acid)  .Marland Kitchen.. 1 tab by mouth once daily  Patient Instructions: 1)  increase your pravachol from 10 to 20mg  daily (that is 2 of the 10 mg pills once a day)  2)  if any muscle pain - call and let me know  3)  schedule fasting labs for 6 weeks for lipid/ast/alt/ AIC/microalbumin  / renal 272, 250.0 4)  drop off DOT form when you get it please  Prescriptions: PRAVASTATIN SODIUM 10 MG TABS (PRAVASTATIN SODIUM) 2  tablets by mouth once daily  #60 x 11   Entered and Authorized by:   Judith Part MD   Signed by:   Judith Part MD on 08/03/2010   Method used:   [facility]        [facility]  [address] [phone]       [address]       [place], [place]  [id number]       Ph: [phone] or [phone]       Fax: [phone]   RxID:   [id number]    Orders Added: 1)  UA Dipstick w/o Micro (manual) [81002] 2)  Audiometry [92552] 3)  Vision Screening [25956] 4)  Cardiology Referral [Cardiology] 5)  UA Dipstick W/ Micro (manual) [81000] 6)  Audiometry [92552] 7)  Vision Screening [99173] 8)  Work-Related DOT  [38756] 9)  Prescription Created Electronically [G8553] 10)  Vision Screening [99173] 11)  Audiometry [92552] 12)  UA Dipstick w/o Micro (manual) [81002] 13)  Work-Related DOT [43329] 14)  Est. Patient Level II [51884]    Current Allergies (reviewed today): ! PENICILLIN ! TETRACYCLINE ! * STATINS   Laboratory Results   Urine Tests  Date/Time Received: August 04, 2010 10:22 AM   Routine Urinalysis   Color: yellow Appearance: Clear Glucose: negative   (Normal Range: Negative) Bilirubin: negative   (Normal Range: Negative) Ketone: negative   (Normal Range: Negative) Spec. Gravity: 1.010   (Normal Range: 1.003-1.035) Blood: negative   (Normal Range: Negative) pH: 7.0   (Normal Range: 5.0-8.0) Protein: negative   (Normal Range: Negative) Urobilinogen: 0.2   (Normal Range: 0-1) Nitrite: negative   (Normal Range: Negative) Leukocyte Esterace: negative   (Normal Range: Negative)  Urine Microscopic WBC/HPF: 0-1 RBC/HPF: 0 Bacteria/HPF: 0 Mucous/HPF: few Epithelial/HPF: 0 Crystals/HPF: 0 Casts/LPF: 0 Yeast/HPF: 0 Other: 0

## 2010-08-18 NOTE — Letter (Signed)
Summary: CDL Form  CDL Form   Imported By: Beau Fanny 08/10/2010 15:21:30  _____________________________________________________________________  External Attachment:    Type:   Image     Comment:   External Document

## 2010-08-18 NOTE — Assessment & Plan Note (Signed)
Summary: hearing test  Nurse Visit  40db HL: Left  500 hz: 40db 1000 hz: 40db 2000 hz: 40db 4000 hz: 40db Right  500 hz: 40db 1000 hz: 25db 2000 hz: 40db 4000 hz: 40db    Allergies: 1)  ! Penicillin 2)  ! Tetracycline 3)  ! * Statins

## 2010-08-18 NOTE — Progress Notes (Signed)
Summary: DOT forms   Phone Note Call from Patient Call back at Home Phone [phone]   Caller: Patient Summary of Call: Patient dropped off DOT physical forms to be filled out. Forms are on your shelf. Initial call taken by: Melody Comas,  August 08, 2010 10:19 AM  Follow-up for Phone Call        I looked over his forms and looks like he needs a hearing eval to pass -he missed some tones here in 2000 hz range I need to ref to ENT for hearing eval -- is he ok with that? -- let me know and I will refer  Follow-up by: Judith Part MD,  August 08, 2010 1:36 PM  Additional Follow-up for Phone Call Additional follow up Details #1::        Spoke with patient and was informed that he does not want the referral at this time. Patient requested that the hearing test be rechecked here first. Patient stated that he does not feel that the test was done well here at the office. Sydell Axon LPN  August 08, 2010 3:24 PM  then go ahead and re schedule hearing test here-nurse visit and I will hold form  Additional Follow-up by: Judith Part MD,  August 08, 2010 3:28 PM    Additional Follow-up for Phone Call Additional follow up Details #2::    Patient's wife  notified as instructed by telephone. Mrs Chiquito said she would have pt to call back and schedule nurse visit to repeat hearing test.Rena Bascom [facility] LPN  August 08, 2010 3:52 PM

## 2010-08-28 LAB — DIFFERENTIAL
Basophils Relative: 0 % (ref 0–1)
Eosinophils Absolute: 0.1 10*3/uL (ref 0.0–0.7)
Monocytes Absolute: 1 10*3/uL (ref 0.1–1.0)
Monocytes Relative: 11 % (ref 3–12)
Neutro Abs: 5.6 10*3/uL (ref 1.7–7.7)

## 2010-08-28 LAB — CBC
Hemoglobin: 10.7 g/dL — ABNORMAL LOW (ref 13.0–17.0)
MCHC: 34.5 g/dL (ref 30.0–36.0)
MCV: 97.5 fL (ref 78.0–100.0)
RBC: 3.19 MIL/uL — ABNORMAL LOW (ref 4.22–5.81)

## 2010-08-28 LAB — IRON AND TIBC: Saturation Ratios: 8 % — ABNORMAL LOW (ref 20–55)

## 2010-08-28 LAB — PLATELET INHIBITION P2Y12: P2Y12 % Inhibition: 0 %

## 2010-08-28 LAB — FERRITIN: Ferritin: 106 ng/mL (ref 22–322)

## 2010-08-30 ENCOUNTER — Other Ambulatory Visit: Payer: Self-pay | Admitting: Specialist

## 2010-08-30 ENCOUNTER — Ambulatory Visit
Admission: RE | Admit: 2010-08-30 | Discharge: 2010-08-30 | Disposition: A | Discharge status: Home / Self Care | Payer: Medicare Other | Source: Ambulatory Visit | Attending: Specialist | Admitting: Specialist

## 2010-08-30 DIAGNOSIS — Z01811 Encounter for preprocedural respiratory examination: Secondary | ICD-10-CM

## 2010-08-31 ENCOUNTER — Ambulatory Visit (HOSPITAL_BASED_OUTPATIENT_CLINIC_OR_DEPARTMENT_OTHER)
Admission: RE | Admit: 2010-08-31 | Discharge: 2010-08-31 | Disposition: A | Discharge status: Home / Self Care | Payer: Medicare Other | Source: Ambulatory Visit | Attending: Specialist | Admitting: Specialist

## 2010-08-31 ENCOUNTER — Encounter: Payer: Self-pay | Admitting: Cardiovascular Disease

## 2010-08-31 DIAGNOSIS — K219 Gastro-esophageal reflux disease without esophagitis: Secondary | ICD-10-CM | POA: Insufficient documentation

## 2010-08-31 DIAGNOSIS — I252 Old myocardial infarction: Secondary | ICD-10-CM | POA: Insufficient documentation

## 2010-08-31 DIAGNOSIS — Z9861 Coronary angioplasty status: Secondary | ICD-10-CM | POA: Insufficient documentation

## 2010-08-31 DIAGNOSIS — I10 Essential (primary) hypertension: Secondary | ICD-10-CM | POA: Insufficient documentation

## 2010-08-31 DIAGNOSIS — H269 Unspecified cataract: Secondary | ICD-10-CM | POA: Insufficient documentation

## 2010-08-31 DIAGNOSIS — Z01812 Encounter for preprocedural laboratory examination: Secondary | ICD-10-CM | POA: Insufficient documentation

## 2010-08-31 DIAGNOSIS — Z79899 Other long term (current) drug therapy: Secondary | ICD-10-CM | POA: Insufficient documentation

## 2010-08-31 DIAGNOSIS — J449 Chronic obstructive pulmonary disease, unspecified: Secondary | ICD-10-CM | POA: Insufficient documentation

## 2010-08-31 DIAGNOSIS — J4489 Other specified chronic obstructive pulmonary disease: Secondary | ICD-10-CM | POA: Insufficient documentation

## 2010-08-31 DIAGNOSIS — I2510 Atherosclerotic heart disease of native coronary artery without angina pectoris: Secondary | ICD-10-CM | POA: Insufficient documentation

## 2010-08-31 LAB — POCT I-STAT 4, (NA,K, GLUC, HGB,HCT)
Glucose, Bld: 142 mg/dL — ABNORMAL HIGH (ref 70–99)
HCT: 35 % — ABNORMAL LOW (ref 39.0–52.0)
Hemoglobin: 11.9 g/dL — ABNORMAL LOW (ref 13.0–17.0)
Sodium: 144 mEq/L (ref 135–145)

## 2010-09-12 ENCOUNTER — Ambulatory Visit: Payer: BLUE CROSS/BLUE SHIELD | Admitting: Cardiovascular Disease

## 2010-09-13 LAB — HEMOGLOBIN AND HEMATOCRIT, BLOOD
HCT: 28.3 % — ABNORMAL LOW (ref 39.0–52.0)
HCT: 29 % — ABNORMAL LOW (ref 39.0–52.0)
HCT: 29.9 % — ABNORMAL LOW (ref 39.0–52.0)
HCT: 30 % — ABNORMAL LOW (ref 39.0–52.0)
HCT: 31.2 % — ABNORMAL LOW (ref 39.0–52.0)
HCT: 31.5 % — ABNORMAL LOW (ref 39.0–52.0)
Hemoglobin: 10.2 g/dL — ABNORMAL LOW (ref 13.0–17.0)
Hemoglobin: 10.2 g/dL — ABNORMAL LOW (ref 13.0–17.0)
Hemoglobin: 10.8 g/dL — ABNORMAL LOW (ref 13.0–17.0)
Hemoglobin: 9.6 g/dL — ABNORMAL LOW (ref 13.0–17.0)
Hemoglobin: 9.8 g/dL — ABNORMAL LOW (ref 13.0–17.0)

## 2010-09-13 LAB — PREPARE RBC (CROSSMATCH)

## 2010-09-13 LAB — CROSSMATCH
ABO/RH(D): A NEG
ABO/RH(D): A NEG
Antibody Screen: NEGATIVE
Antibody Screen: NEGATIVE

## 2010-09-13 LAB — COMPREHENSIVE METABOLIC PANEL
AST: 23 U/L (ref 0–37)
Albumin: 3.1 g/dL — ABNORMAL LOW (ref 3.5–5.2)
BUN: 20 mg/dL (ref 6–23)
CO2: 23 mEq/L (ref 19–32)
Calcium: 8.7 mg/dL (ref 8.4–10.5)
Calcium: 9 mg/dL (ref 8.4–10.5)
Creatinine, Ser: 1.4 mg/dL (ref 0.4–1.5)
Creatinine, Ser: 1.47 mg/dL (ref 0.4–1.5)
GFR calc Af Amer: 57 mL/min — ABNORMAL LOW (ref >60)
GFR calc non Af Amer: 50 mL/min — ABNORMAL LOW (ref >60)
Glucose, Bld: 107 mg/dL — ABNORMAL HIGH (ref 70–99)
Sodium: 138 mEq/L (ref 135–145)
Total Bilirubin: 2 mg/dL — ABNORMAL HIGH (ref 0.3–1.2)
Total Protein: 5.7 g/dL — ABNORMAL LOW (ref 6.0–8.3)
Total Protein: 6.5 g/dL (ref 6.0–8.3)

## 2010-09-13 LAB — GLUCOSE, CAPILLARY
Glucose-Capillary: 109 mg/dL — ABNORMAL HIGH (ref 70–99)
Glucose-Capillary: 118 mg/dL — ABNORMAL HIGH (ref 70–99)
Glucose-Capillary: 127 mg/dL — ABNORMAL HIGH (ref 70–99)
Glucose-Capillary: 133 mg/dL — ABNORMAL HIGH (ref 70–99)
Glucose-Capillary: 141 mg/dL — ABNORMAL HIGH (ref 70–99)
Glucose-Capillary: 143 mg/dL — ABNORMAL HIGH (ref 70–99)
Glucose-Capillary: 148 mg/dL — ABNORMAL HIGH (ref 70–99)
Glucose-Capillary: 222 mg/dL — ABNORMAL HIGH (ref 70–99)
Glucose-Capillary: 97 mg/dL (ref 70–99)

## 2010-09-13 LAB — HEMOCCULT GUIAC POC 1CARD (OFFICE): Fecal Occult Bld: POSITIVE

## 2010-09-13 LAB — ABO/RH: ABO/RH(D): A NEG

## 2010-09-13 LAB — CARDIAC PANEL(CRET KIN+CKTOT+MB+TROPI)
CK, MB: 0.7 ng/mL (ref 0.3–4.0)
CK, MB: 0.8 ng/mL (ref 0.3–4.0)
Relative Index: INVALID (ref 0.0–2.5)
Total CK: 47 U/L (ref 7–232)
Total CK: 64 U/L (ref 7–232)
Troponin I: 0.01 ng/mL (ref 0.00–0.06)

## 2010-09-13 LAB — CBC
HCT: 21.3 % — ABNORMAL LOW (ref 39.0–52.0)
HCT: 27.4 % — ABNORMAL LOW (ref 39.0–52.0)
Hemoglobin: 7.2 g/dL — ABNORMAL LOW (ref 13.0–17.0)
Hemoglobin: 7.6 g/dL — ABNORMAL LOW (ref 13.0–17.0)
MCHC: 33.6 g/dL (ref 30.0–36.0)
MCHC: 33.7 g/dL (ref 30.0–36.0)
MCV: 100.1 fL — ABNORMAL HIGH (ref 78.0–100.0)
MCV: 101 fL — ABNORMAL HIGH (ref 78.0–100.0)
MCV: 102.4 fL — ABNORMAL HIGH (ref 78.0–100.0)
Platelets: 179 10*3/uL (ref 150–400)
RBC: 2.2 MIL/uL — ABNORMAL LOW (ref 4.22–5.81)
RDW: 15.1 % (ref 11.5–15.5)
RDW: 15.6 % — ABNORMAL HIGH (ref 11.5–15.5)
RDW: 15.9 % — ABNORMAL HIGH (ref 11.5–15.5)
WBC: 7.6 10*3/uL (ref 4.0–10.5)

## 2010-09-13 LAB — PROTIME-INR
INR: 1.06 (ref 0.00–1.49)
Prothrombin Time: 13.7 seconds (ref 11.6–15.2)

## 2010-09-13 LAB — CLOTEST (H. PYLORI), BIOPSY: Helicobacter screen: NEGATIVE

## 2010-09-13 LAB — TSH: TSH: 1.075 u[IU]/mL (ref 0.350–4.500)

## 2010-09-13 LAB — BRAIN NATRIURETIC PEPTIDE: Pro B Natriuretic peptide (BNP): 421 pg/mL — ABNORMAL HIGH (ref 0.0–100.0)

## 2010-09-15 ENCOUNTER — Other Ambulatory Visit: Payer: BLUE CROSS/BLUE SHIELD

## 2010-09-19 LAB — URINALYSIS, ROUTINE W REFLEX MICROSCOPIC
Nitrite: NEGATIVE
Specific Gravity, Urine: 1.009 (ref 1.005–1.030)
Urobilinogen, UA: 0.2 mg/dL (ref 0.0–1.0)
pH: 6.5 (ref 5.0–8.0)

## 2010-09-19 LAB — GLUCOSE, CAPILLARY

## 2010-09-19 LAB — BASIC METABOLIC PANEL
Calcium: 9.1 mg/dL (ref 8.4–10.5)
GFR calc Af Amer: >60 mL/min (ref >60)
GFR calc non Af Amer: 58 mL/min — ABNORMAL LOW (ref >60)
Glucose, Bld: 125 mg/dL — ABNORMAL HIGH (ref 70–99)
Sodium: 139 mEq/L (ref 135–145)

## 2010-09-19 LAB — CBC
Hemoglobin: 13.5 g/dL (ref 13.0–17.0)
RBC: 3.88 MIL/uL — ABNORMAL LOW (ref 4.22–5.81)
RDW: 14.2 % (ref 11.5–15.5)
WBC: 7.6 10*3/uL (ref 4.0–10.5)

## 2010-09-21 ENCOUNTER — Other Ambulatory Visit: Payer: Self-pay | Admitting: *Deleted

## 2010-09-21 LAB — GLUCOSE, CAPILLARY: Glucose-Capillary: 135 mg/dL — ABNORMAL HIGH (ref 70–99)

## 2010-09-21 MED ORDER — METOPROLOL SUCCINATE ER 25 MG PO TB24: 25.0000 mg | ORAL_TABLET | Freq: Every day | ORAL | Status: DC

## 2010-10-07 ENCOUNTER — Ambulatory Visit (INDEPENDENT_AMBULATORY_CARE_PROVIDER_SITE_OTHER): Payer: Medicare Other | Admitting: Cardiovascular Disease

## 2010-10-07 ENCOUNTER — Encounter: Payer: Self-pay | Admitting: Cardiovascular Disease

## 2010-10-07 DIAGNOSIS — R0989 Other specified symptoms and signs involving the circulatory and respiratory systems: Secondary | ICD-10-CM

## 2010-10-07 DIAGNOSIS — I2510 Atherosclerotic heart disease of native coronary artery without angina pectoris: Secondary | ICD-10-CM

## 2010-10-07 DIAGNOSIS — E785 Hyperlipidemia, unspecified: Secondary | ICD-10-CM

## 2010-10-07 DIAGNOSIS — I10 Essential (primary) hypertension: Secondary | ICD-10-CM

## 2010-10-07 NOTE — Assessment & Plan Note (Signed)
Continue ASA.  Duplex in October

## 2010-10-07 NOTE — Assessment & Plan Note (Signed)
Improved taking pravastatin daily.  Cannot go up on dose as myalgias return.  F/U labs Dr Milinda Antis

## 2010-10-07 NOTE — Progress Notes (Signed)
Curtis Frazier is seen today  For F/U of CAD, carotid disease.   He has a BMS in his circ from [facility] in 02/2009. GI bleed last year from AVM.    He had a normal myovue in January 2011 with no ischemia and normal LV The dust seems to have settled in regard to his other medical problems.  His gi bleed has subsided with no melena, his sinus infection has cleared.  He still has a bad back and may need lumbar spine surgery with Dr Lovell Sheehan.  He is cleared to have this within the next year and Plavix can be stopped 5-7 days before.  He denies SSCP, palpitations, edema or syncope  his lipitor is giving him pains and he has not tolerated simvastatin in the past.  Tolerating qod pravastatin.  Last duplex 10/11 with 40-59% bilateral ICA stenosis  F/U duplex 10/12   ROS: Denies fever, malais, weight loss, blurry vision, decreased visual acuity, cough, sputum, SOB, hemoptysis, pleuritic pain, palpitaitons, heartburn, abdominal pain, melena, lower extremity edema, claudication, or rash.   General: Affect appropriate Healthy:  appears stated age HEENT: normal Neck supple with no adenopathy JVP normal bilateral  bruits no thyromegaly Lungs clear with no wheezing and good diaphragmatic motion Heart:  S1/S2 SEM murmur, no rub, gallop or click PMI normal Abdomen: benighn, BS positve, no tenderness, no AAA no bruit.  No HSM or HJR Distal pulses intact with no bruits No edema Neuro non-focal Skin warm and dry No muscular weakness   Current Outpatient Prescriptions  Medication Sig Dispense Refill  . aspirin 81 MG tablet Take 81 mg by mouth daily.        . Cholecalciferol 1000 UNITS capsule Take 1,000 Units by mouth daily.        . clopidogrel (PLAVIX) 75 MG tablet Take 75 mg by mouth daily.        . fish oil-omega-3 fatty acids 1000 MG capsule Take 2 g by mouth daily.        Marland Kitchen FOLIC ACID PO Take by mouth daily.        Marland Kitchen HYDROcodone-acetaminophen (VICODIN) 5-500 MG per tablet Take 1 tablet by mouth as directed.          . METHOTREXATE, ANTI-RHEUMATIC, PO Take by mouth once a week.        . metoprolol succinate (TOPROL-XL) 25 MG 24 hr tablet Take 1 tablet (25 mg total) by mouth daily.  90 tablet  3  . multivitamin (THERAGRAN) per tablet Take 1 tablet by mouth daily.        . nitroGLYCERIN (NITROSTAT) 0.4 MG SL tablet Place 0.4 mg under the tongue every 5 (five) minutes as needed.        Marland Kitchen omeprazole (PRILOSEC) 40 MG capsule Take 40 mg by mouth daily.        . pravastatin (PRAVACHOL) 10 MG tablet Take 10 mg by mouth daily.         Allergies  Penicillins; Statins; and Tetracycline  Electrocardiogram:  NSR 61  Normal ECG  Assessment and Plan

## 2010-10-07 NOTE — Assessment & Plan Note (Signed)
No angina.  Myovue noninschemic 1/11.  Normal ECG.  Will need myovue to clear for back surgery in July

## 2010-10-07 NOTE — Patient Instructions (Addendum)
Your physician recommends that you schedule a follow-up appointment in: 6 months with Dr. Eden Emms  Pt will call back when having back surgery with Dr. Lovell Sheehan.  Will need myoview prior.

## 2010-10-07 NOTE — Assessment & Plan Note (Signed)
Well controlled.  Continue current medications and low sodium Dash type diet.    

## 2010-10-12 ENCOUNTER — Ambulatory Visit (HOSPITAL_BASED_OUTPATIENT_CLINIC_OR_DEPARTMENT_OTHER)
Admission: RE | Admit: 2010-10-12 | Discharge: 2010-10-12 | Disposition: A | Discharge status: Home / Self Care | Payer: Medicare Other | Source: Ambulatory Visit | Attending: Specialist | Admitting: Specialist

## 2010-10-12 DIAGNOSIS — H269 Unspecified cataract: Secondary | ICD-10-CM | POA: Insufficient documentation

## 2010-10-12 DIAGNOSIS — Z01812 Encounter for preprocedural laboratory examination: Secondary | ICD-10-CM | POA: Insufficient documentation

## 2010-10-12 DIAGNOSIS — K219 Gastro-esophageal reflux disease without esophagitis: Secondary | ICD-10-CM | POA: Insufficient documentation

## 2010-10-12 DIAGNOSIS — I10 Essential (primary) hypertension: Secondary | ICD-10-CM | POA: Insufficient documentation

## 2010-10-12 DIAGNOSIS — I252 Old myocardial infarction: Secondary | ICD-10-CM | POA: Insufficient documentation

## 2010-10-12 DIAGNOSIS — I2510 Atherosclerotic heart disease of native coronary artery without angina pectoris: Secondary | ICD-10-CM | POA: Insufficient documentation

## 2010-10-12 DIAGNOSIS — J449 Chronic obstructive pulmonary disease, unspecified: Secondary | ICD-10-CM | POA: Insufficient documentation

## 2010-10-12 DIAGNOSIS — R7309 Other abnormal glucose: Secondary | ICD-10-CM | POA: Insufficient documentation

## 2010-10-12 DIAGNOSIS — H52209 Unspecified astigmatism, unspecified eye: Secondary | ICD-10-CM | POA: Insufficient documentation

## 2010-10-12 DIAGNOSIS — J4489 Other specified chronic obstructive pulmonary disease: Secondary | ICD-10-CM | POA: Insufficient documentation

## 2010-10-12 LAB — GLUCOSE, CAPILLARY: Glucose-Capillary: 113 mg/dL — ABNORMAL HIGH (ref 70–99)

## 2010-11-14 ENCOUNTER — Other Ambulatory Visit: Payer: Self-pay

## 2010-11-14 MED ORDER — CLOPIDOGREL BISULFATE 75 MG PO TABS: 75.0000 mg | ORAL_TABLET | Freq: Every day | ORAL | Status: DC

## 2010-11-14 NOTE — Telephone Encounter (Signed)
[facility] [address] [facility]ed refill request for Plavix 75mg . Refilled #30 with 0 add'l refills. Pt already has appt scheduled with Dr Milinda Antis 11/25/10.

## 2010-11-18 ENCOUNTER — Other Ambulatory Visit (INDEPENDENT_AMBULATORY_CARE_PROVIDER_SITE_OTHER): Payer: Medicare Other | Admitting: Family Medicine

## 2010-11-18 DIAGNOSIS — E119 Type 2 diabetes mellitus without complications: Secondary | ICD-10-CM

## 2010-11-18 DIAGNOSIS — E7800 Pure hypercholesterolemia, unspecified: Secondary | ICD-10-CM

## 2010-11-18 DIAGNOSIS — D649 Anemia, unspecified: Secondary | ICD-10-CM

## 2010-11-18 DIAGNOSIS — I10 Essential (primary) hypertension: Secondary | ICD-10-CM

## 2010-11-18 LAB — HEPATIC FUNCTION PANEL
AST: 19 U/L (ref 0–37)
Albumin: 4 g/dL (ref 3.5–5.2)
Alkaline Phosphatase: 63 U/L (ref 39–117)

## 2010-11-18 LAB — TSH: TSH: 0.84 u[IU]/mL (ref 0.35–5.50)

## 2010-11-18 LAB — CBC WITH DIFFERENTIAL/PLATELET
Basophils Absolute: 0 10*3/uL (ref 0.0–0.1)
HCT: 36.1 % — ABNORMAL LOW (ref 39.0–52.0)
Lymphs Abs: 2.1 10*3/uL (ref 0.7–4.0)
MCV: 98.7 fl (ref 78.0–100.0)
Monocytes Absolute: 0.6 10*3/uL (ref 0.1–1.0)
Platelets: 241 10*3/uL (ref 150.0–400.0)
RDW: 15.5 % — ABNORMAL HIGH (ref 11.5–14.6)

## 2010-11-18 LAB — LIPID PANEL
Total CHOL/HDL Ratio: 5
Triglycerides: 98 mg/dL (ref 0.0–149.0)

## 2010-11-18 LAB — RENAL FUNCTION PANEL
BUN: 21 mg/dL (ref 6–23)
CO2: 27 mEq/L (ref 19–32)
Calcium: 9.3 mg/dL (ref 8.4–10.5)
Creatinine, Ser: 1.3 mg/dL (ref 0.4–1.5)
GFR: 57.78 mL/min — ABNORMAL LOW (ref >60.00)

## 2010-11-20 ENCOUNTER — Encounter: Payer: Self-pay | Admitting: Family Medicine

## 2010-11-25 ENCOUNTER — Ambulatory Visit (INDEPENDENT_AMBULATORY_CARE_PROVIDER_SITE_OTHER): Payer: Medicare Other | Admitting: Family Medicine

## 2010-11-25 ENCOUNTER — Encounter: Payer: Self-pay | Admitting: Family Medicine

## 2010-11-25 DIAGNOSIS — E875 Hyperkalemia: Secondary | ICD-10-CM

## 2010-11-25 DIAGNOSIS — D649 Anemia, unspecified: Secondary | ICD-10-CM

## 2010-11-25 DIAGNOSIS — E785 Hyperlipidemia, unspecified: Secondary | ICD-10-CM

## 2010-11-25 DIAGNOSIS — I10 Essential (primary) hypertension: Secondary | ICD-10-CM

## 2010-11-25 DIAGNOSIS — E119 Type 2 diabetes mellitus without complications: Secondary | ICD-10-CM

## 2010-11-25 MED ORDER — PRAVASTATIN SODIUM 10 MG PO TABS: 10.0000 mg | ORAL_TABLET | Freq: Every day | ORAL | Status: DC

## 2010-11-25 NOTE — Assessment & Plan Note (Signed)
Chol up  Can only tol 10 of pravachol  Doing best he can  Disc low sat fat diet  Will continue to watch

## 2010-11-25 NOTE — Assessment & Plan Note (Signed)
Improved but not at goal - with back surgery coming up in the future  Will start ferrous sulfate daily Re check 3 mo before f/u

## 2010-11-25 NOTE — Assessment & Plan Note (Signed)
Improved off ace Will need to keep check of microalb

## 2010-11-25 NOTE — Assessment & Plan Note (Signed)
A1C is up and needs improvement  Disc getting rid of sweets and cutting back starches  Needs more time to take care of himself Also disc low impact exercise

## 2010-11-25 NOTE — Progress Notes (Signed)
Subjective:    Patient ID: Curtis Frazier, male    DOB: 14-Nov-1940, 70 y.o.   MRN: [id number]  HPI Here for f/u of DM and lipids and HTN and anemia   DM is worse with a1c of 7.0 up from 6.7  opthy - last one was may 2nd -- (has had some eye surgeries -- cataract)  Diet - has changed - eating ice cream and things he should not , no other sweets , some pepsi occas , eating too much bread , and more fruit lately  Has been to Dm classes - has literature to review  Exercise-- slow due to his back and [facility]  Schedule has been crazy  Can use machines with back support and has stationary bike at home  Walking is not too bad   Lipids are up a bit too  Has baseline low hdl  LDL is 131 up from 113 (on low dose pravachol)- intol to statins in past  Does not tolerate the 20 mg at all - needs to go back to the 10 mg due to cramping   HTN 126/76 No cp or ha or edema or palpitations   Wt is up 2 lb  Anemia continues to improve 12.2  Has not been up to 14 "they wanted" in hosp after GI bleed  Not fatigued  No blood in stool   Patient Active Problem List  Diagnoses  . DIABETES MELLITUS, TYPE II  . HYPERLIPIDEMIA  . HYPERKALEMIA  . ANEMIA-UNSPECIFIED  . ESSENTIAL HYPERTENSION, BENIGN  . C A D  . GERD  . ANGIODYSPLASIA-INTESTINE  . PSORIATIC ARTHRITIS  . PSORIASIS  . ACTINIC KERATOSIS  . ARTHRITIS, RHEUMATOID  . DEGENERATIVE DISC DISEASE  . SCOLIOSIS  . MURMUR  . CAROTID BRUIT  . HX, PERSONAL, COLONIC POLYPS  . TOBACCO ABUSE, HX OF   Past Medical History  Diagnosis Date  . Other and unspecified hyperlipidemia   . Coronary atherosclerosis of unspecified type of vessel, native or graft   . Rheumatoid arthritis   . Personal history of colonic polyps   . Actinic keratosis   . Psoriatic arthropathy   . Other psoriasis   . Scoliosis (and kyphoscoliosis), idiopathic   . Degeneration of intervertebral disc, site unspecified   . Esophageal reflux   . Type II  or unspecified type diabetes mellitus without mention of complication, not stated as uncontrolled     type II  . GI bleed 12/10    [facility]ak AVM  . Gastritis 12/10   Past Surgical History  Procedure Date  . Rotator cuff repair   . Colonoscopy   . Vasectomy   . Ptca stent 02/2009   History  Substance Use Topics  . Smoking status: Former Games developer  . Smokeless tobacco: Not on file  . Alcohol Use: Yes     occasional   Family History  Problem Relation Age of Onset  . Coronary artery disease Mother   . Diabetes Mother   . Heart disease Mother     CAD  . Coronary artery disease Sister   . Hypertension Sister   . Kidney failure Sister   . Heart disease Sister     CAD  . Pancreatic cancer Brother   . Diabetes Brother   . Prostate cancer Brother   . Cancer Brother     pancreatic CA  . Diabetes Brother    Allergies  Allergen Reactions  . Penicillins  REACTION: rash, swelling  . Statins     REACTION: reaction not known  . Tetracycline     REACTION: rash, swelling   Current Outpatient Prescriptions on File Prior to Visit  Medication Sig Dispense Refill  . aspirin 81 MG tablet Take 81 mg by mouth daily.        . Cholecalciferol 1000 UNITS capsule Take 1,000 Units by mouth daily.        . clopidogrel (PLAVIX) 75 MG tablet Take 1 tablet (75 mg total) by mouth daily.  30 tablet  0  . fish oil-omega-3 fatty acids 1000 MG capsule Take 1 g by mouth daily.       Marland Kitchen FOLIC ACID PO Take 1 tablet by mouth daily.       Marland Kitchen HYDROcodone-acetaminophen (VICODIN) 5-500 MG per tablet Take 1 tablet by mouth 2 (two) times daily.       Marland Kitchen METHOTREXATE, ANTI-RHEUMATIC, PO Take by mouth once a week.        . metoprolol succinate (TOPROL-XL) 25 MG 24 hr tablet Take 1 tablet (25 mg total) by mouth daily.  90 tablet  3  . multivitamin (THERAGRAN) per tablet Take 1 tablet by mouth daily.        Marland Kitchen omeprazole (PRILOSEC) 40 MG capsule Take 40 mg by mouth daily.        . nitroGLYCERIN (NITROSTAT) 0.4 MG SL  tablet Place 0.4 mg under the tongue every 5 (five) minutes as needed.              Review of Systems Review of Systems  Constitutional: Negative for fever, appetite change, fatigue and unexpected weight change.  Eyes: Negative for pain and visual disturbance.  Respiratory: Negative for cough and shortness of breath.   Cardiovascular: Negative. For cp or sob or edema   Gastrointestinal: Negative for nausea, diarrhea and constipation.  Genitourinary: Negative for urgency and frequency.  Skin: Negative for pallor.  Neurological: Negative for weakness, light-headedness, numbness and headaches.  Hematological: Negative for adenopathy. Does not bruise/bleed easily.  Psychiatric/Behavioral: Negative for dysphoric mood. The patient is not nervous/anxious.          Objective:   Physical Exam  Constitutional: He appears well-developed and well-nourished. No distress.  HENT:  Head: Normocephalic and atraumatic.  Right Ear: External ear normal.  Left Ear: External ear normal.  Nose: Nose normal.  Mouth/Throat: Oropharynx is clear and moist.  Eyes: Conjunctivae and EOM are normal. Pupils are equal, round, and reactive to light.  Neck: Normal range of motion. Neck supple. No JVD present. Carotid bruit is present. No thyromegaly present.  Cardiovascular: Normal rate, regular rhythm and normal heart sounds.   No murmur heard. Pulmonary/Chest: Effort normal and breath sounds normal. No respiratory distress. He has no wheezes.       Diffusely distant bs   Abdominal: Soft. Bowel sounds are normal. He exhibits no distension, no abdominal bruit and no mass. There is no tenderness.  Musculoskeletal: Normal range of motion. He exhibits no edema and no tenderness.  Lymphadenopathy:    He has no cervical adenopathy.  Neurological: He is alert. He has normal reflexes. Coordination normal.  Skin: Skin is warm and dry. No rash noted. No erythema. No pallor.  Psychiatric: He has a normal mood and  affect.          Assessment & Plan:

## 2010-11-25 NOTE — Patient Instructions (Signed)
Start iron - ferrous sulfate 325 mg one pill daily- you can buy this over the counter  Iron does constipate - so you may need colace once to twice daily with water  Diet -- stop all sweets incl ice cream and keep fruit serving 2-3 oz maximum Also watch portions of pasta/ bread / rice / -- starchy vegetables like corn , squash, carrots Always choose whole wheat or whole graim  Aim for at least 20 minutes day of exercise- recumbant bike/ occas walking/ gym -- if necessary get up early to do it  Schedule lab and follow up in 3 months

## 2010-12-20 ENCOUNTER — Other Ambulatory Visit: Payer: Self-pay | Admitting: *Deleted

## 2010-12-20 MED ORDER — CLOPIDOGREL BISULFATE 75 MG PO TABS: 75.0000 mg | ORAL_TABLET | Freq: Every day | ORAL | Status: DC

## 2011-01-06 NOTE — Op Note (Signed)
  NAME:  Curtis Frazier, Curtis Frazier              ACCOUNT NO.:  [id number]  MEDICAL RECORD NO.:  [id number]          PATIENT TYPE:  LOCATION:                                 FACILITY:  PHYSICIAN:  Chucky May, M.D.  DATE OF BIRTH:  [birth year]  DATE OF PROCEDURE:  10/12/2010 DATE OF DISCHARGE:                              OPERATIVE REPORT   SURGEON:  Chucky May, M.D.  ANESTHESIA:  MAC.  PREOPERATIVE DIAGNOSIS:  Cataract right eye.  POSTOPERATIVE DIAGNOSIS:  Cataract right eye.  OPERATION PERFORMED:  Cataract extraction with intraocular lens implantation with correction of astigmatism by toric intraocular lens and LenSx laser treatment.  PROCEDURE IN DETAIL:  The patient was brought to the main operating room and placed in a supine position.  Previously, he had been treated in the laser suite by placement of a primary laser incision temporally with a secondary port superiorly and a limbal relaxing incision at axis 349 of 30 degrees width.  Once in the main operating room, anesthesia was obtained by means of topical 4% lidocaine drops and tetracaine.  He was then prepped and draped in the usual manner.  A lid speculum was inserted and the previously created corneal incisions were opened for the primary incision and the secondary incision.  Viscoelastic was instilled into the anterior chamber and the anterior capsular flap previously created by the laser was removed without difficulty.  The nucleus was mobilized by gentle hydrodissection and then the nucleus was emulsified by phacoemulsification.  Residual cortical material was removed by irrigation and aspiration.  The posterior capsule was polished.  Viscoelastic was placed into the capsular bag and a toric intraocular lens model [id number] of 24.0 diopters was placed in the bag without difficulty.  The orientation was placed at axis of 169 degrees. Viscoelastic was then removed by irrigation and aspiration and again the axis was  checked and noted to be in the appropriate axis.  The viscoelastic was replaced with Balanced Salt Solution and the wounds were hydrated with Balanced Salt Solution.  There appeared to be some leakage at the 180 degrees wound so a single interrupted 10-0 nylon suture was placed to effect closure of the wound.  The eye was dressed with topical Vigamox and Pred Forte and a Fox shield and the patient was taken to the recovery room in excellent condition where he received written and verbal instructions for his postoperative care and was scheduled for followup in 24 hours.          ______________________________ Chucky May, M.D.    DJD/MEDQ  D:  10/13/2010  T:  10/13/2010  Job:  [id number]  Electronically Signed by Nelson Chimes M.D. on 01/06/2011 09:00:24 AM

## 2011-01-06 NOTE — Op Note (Signed)
  NAME:  Curtis Frazier, Curtis Frazier              ACCOUNT NO.:  [id number]  MEDICAL RECORD NO.:  [id number]          PATIENT TYPE:  LOCATION:                                 FACILITY:  PHYSICIAN:  Chucky May, M.D.  DATE OF BIRTH:  [birth year]  DATE OF PROCEDURE:  08/31/2010 DATE OF DISCHARGE:                              OPERATIVE REPORT   SURGEON:  Chucky May, M.D.  ANESTHESIA:  MAC.  PROCEDURE PERFORMED:  Cataract extraction with intraocular lens implantation using a Toric lens.  PROCEDURE:  The patient was taken to the LenSx laser room where an anterior capsulorrhexis and phacoemulsification of the lens was performed.  The patient was then taken to the main operating room where he was prepped and draped in usual manner.  Lid speculum was then inserted and the conjunctiva was irrigated with 4% topical lidocaine. An incision was made into the temporal cornea entering the anterior chamber.  The viscoelastic was instilled into the anterior chamber.  An additional port was made inferiorly.  The previously made capsulorrhexis flap was gently removed without difficulty.  The nucleus was mobilized by hydrodissection with 1% nonpreserved lidocaine and then phacoemulsified.  Residual cortical material was removed by irrigation- aspiration.  The posterior capsule was polished and a posterior chamber Toric correcting intraocular lens power 25 diopters with 2.25 diopters of cylinder correction, model [id number] was placed without difficulty.  The lens was dialed into a steep axis of 156 degrees for the astigmatic correction.  Viscoelastic was removed by irrigation-aspiration and replaced by Balanced Salt Solution.  The wounds were hydrated with Balanced Salt Solution and checked for fluid leaks and none were noted. The eye was dressed with topical Vigamox and Pred Forte and a Fox shield and the patient was taken to the recovery room in excellent condition where he received written and verbal  instructions for his postoperative care and was scheduled for followup in 24 hours.          ______________________________ Chucky May, M.D.     DJD/MEDQ  D:  09/01/2010  T:  09/02/2010  Job:  [id number]  Electronically Signed by Nelson Chimes M.D. on 01/06/2011 08:59:20 AM

## 2011-02-01 ENCOUNTER — Telehealth: Payer: Self-pay | Admitting: *Deleted

## 2011-02-01 NOTE — Telephone Encounter (Signed)
[facility]/ [facility] is asking for more documentation regarding pt's diabetic supplies.  Letter is on your shelf.

## 2011-02-03 NOTE — Telephone Encounter (Signed)
Done

## 2011-03-03 ENCOUNTER — Other Ambulatory Visit: Payer: Medicare Other

## 2011-03-10 ENCOUNTER — Ambulatory Visit: Payer: Medicare Other | Admitting: Family Medicine

## 2011-03-13 HISTORY — PX: OTHER SURGICAL HISTORY: SHX169

## 2011-04-03 ENCOUNTER — Other Ambulatory Visit: Payer: Self-pay | Admitting: Dermatology

## 2011-04-06 ENCOUNTER — Other Ambulatory Visit: Payer: Self-pay | Admitting: Family Medicine

## 2011-04-06 DIAGNOSIS — I6529 Occlusion and stenosis of unspecified carotid artery: Secondary | ICD-10-CM

## 2011-04-07 ENCOUNTER — Ambulatory Visit: Payer: Medicare Other | Admitting: Cardiovascular Disease

## 2011-04-07 ENCOUNTER — Ambulatory Visit (INDEPENDENT_AMBULATORY_CARE_PROVIDER_SITE_OTHER): Payer: Medicare Other | Admitting: Cardiovascular Disease

## 2011-04-07 ENCOUNTER — Encounter (INDEPENDENT_AMBULATORY_CARE_PROVIDER_SITE_OTHER): Payer: Medicare Other | Admitting: *Deleted

## 2011-04-07 ENCOUNTER — Encounter: Payer: Self-pay | Admitting: Cardiovascular Disease

## 2011-04-07 VITALS — BP 129/72 | HR 55 | Ht 69.0 in | Wt 182.0 lb

## 2011-04-07 DIAGNOSIS — I6529 Occlusion and stenosis of unspecified carotid artery: Secondary | ICD-10-CM

## 2011-04-07 DIAGNOSIS — I2510 Atherosclerotic heart disease of native coronary artery without angina pectoris: Secondary | ICD-10-CM

## 2011-04-07 NOTE — Assessment & Plan Note (Signed)
Will have myovue to clear for surgery.  Pain is atypical.  Stop Plavix 5 days before Will clear once myovue done next week

## 2011-04-07 NOTE — Assessment & Plan Note (Signed)
F/U duplex in 6 months stable

## 2011-04-07 NOTE — Patient Instructions (Signed)
Your physician has requested that you have en exercise stress myoview. For further information please visit [email or web address] Please follow instruction sheet, as given.  Your physician wants you to follow-up in: 6 months with Dr. Haywood Filler will receive a reminder letter in the mail two months in advance. If you don't receive a letter, please call our office to schedule the follow-up appointment.

## 2011-04-07 NOTE — Assessment & Plan Note (Signed)
Cholesterol is at goal.  Continue current dose of statin and diet Rx.  No myalgias or side effects.  F/U  LFT's in 6 months. Lab Results  Component Value Date   LDLCALC 131* 11/18/2010             

## 2011-04-07 NOTE — Progress Notes (Signed)
Curtis Frazier is seen today For F/U of CAD, carotid disease. He has a BMS in his circ from [facility] in 02/2009. GI bleed last year from AVM. He had a normal myovue in January 2011 with no ischemia and normal LV The dust seems to have settled in regard to his other medical problems. His gi bleed has subsided with no melena, his sinus infection has cleared. He still has a bad back and may need lumbar spine surgery with Dr Lovell Sheehan. He has had some fleeting SSCP but has taken nitro on occasion.  Will need myovue to clear for surgery.  Thinks he can walk as he doesn't like the chemical.  Needs new nitro.   Plavix can be stopped 5-7 days before. He denies SSCP, palpitations, edema or syncope his lipitor is giving him pains and he has not tolerated simvastatin in the past. Tolerating qod pravastatin. Last duplex 10/11 with 40-59% bilateral ICA stenosis   Reviewed duplex from today   0-39% RICA and 60-79% LICA  ROS: Denies fever, malais, weight loss, blurry vision, decreased visual acuity, cough, sputum, SOB, hemoptysis, pleuritic pain, palpitaitons, heartburn, abdominal pain, melena, lower extremity edema, claudication, or rash.  All other systems reviewed and negative  General: Affect appropriate Healthy:  appears stated age HEENT: normal Neck supple with no adenopathy JVP normal left  bruits no thyromegaly Lungs clear with no wheezing and good diaphragmatic motion Heart:  S1/S2 SEM murmur,rub, gallop or click PMI normal Abdomen: benighn, BS positve, no tenderness, no AAA no bruit.  No HSM or HJR Distal pulses intact with no bruits No edema Neuro non-focal Skin warm and dry No muscular weakness   Current Outpatient Prescriptions  Medication Sig Dispense Refill  . aspirin 81 MG tablet Take 81 mg by mouth daily.        . Cholecalciferol 1000 UNITS capsule Take 1,000 Units by mouth daily.        Marland Kitchen CLOBETASOL PROPIONATE E 0.05 % emollient cream as needed.      . clopidogrel (PLAVIX) 75 MG tablet Take 1  tablet (75 mg total) by mouth daily.  30 tablet  6  . fish oil-omega-3 fatty acids 1000 MG capsule Take 1 g by mouth daily.       Marland Kitchen FOLIC ACID PO Take 1 tablet by mouth daily.       Marland Kitchen HYDROcodone-acetaminophen (VICODIN) 5-500 MG per tablet Take 1 tablet by mouth 2 (two) times daily.       Marland Kitchen METHOTREXATE, ANTI-RHEUMATIC, PO Take by mouth once a week.        . metoprolol succinate (TOPROL-XL) 25 MG 24 hr tablet Take 1 tablet (25 mg total) by mouth daily.  90 tablet  3  . multivitamin (THERAGRAN) per tablet Take 1 tablet by mouth daily.        . nitroGLYCERIN (NITROSTAT) 0.4 MG SL tablet Place 0.4 mg under the tongue every 5 (five) minutes as needed.        Marland Kitchen omeprazole (PRILOSEC) 40 MG capsule Take 40 mg by mouth daily.        . pravastatin (PRAVACHOL) 10 MG tablet Take 1 tablet (10 mg total) by mouth daily.  30 tablet  11  . triamcinolone (KENALOG) 0.1 % cream as needed.        Allergies  Penicillins; Statins; and Tetracycline  Electrocardiogram:  Assessment and Plan

## 2011-04-13 ENCOUNTER — Encounter: Payer: Self-pay | Admitting: Family Medicine

## 2011-04-14 ENCOUNTER — Telehealth: Payer: Self-pay | Admitting: Cardiovascular Disease

## 2011-04-14 MED ORDER — NITROGLYCERIN 0.4 MG SL SUBL: 0.4000 mg | SUBLINGUAL_TABLET | SUBLINGUAL | Status: DC | PRN

## 2011-04-14 NOTE — Telephone Encounter (Signed)
[facility] [address] he was in last week and was told it was done but when he went to Pharm they didn't have anything

## 2011-04-17 ENCOUNTER — Ambulatory Visit (HOSPITAL_COMMUNITY): Payer: Medicare Other | Attending: Cardiovascular Disease | Admitting: Radiology

## 2011-04-17 VITALS — Ht 69.0 in | Wt 175.0 lb

## 2011-04-17 DIAGNOSIS — I2510 Atherosclerotic heart disease of native coronary artery without angina pectoris: Secondary | ICD-10-CM

## 2011-04-17 DIAGNOSIS — R0609 Other forms of dyspnea: Secondary | ICD-10-CM

## 2011-04-17 DIAGNOSIS — R0789 Other chest pain: Secondary | ICD-10-CM

## 2011-04-17 MED ORDER — TECHNETIUM TC 99M TETROFOSMIN IV KIT
11.0000 | PACK | Freq: Once | INTRAVENOUS | Status: AC | PRN
  Administered 2011-04-17: 11 via INTRAVENOUS

## 2011-04-17 MED ORDER — REGADENOSON 0.4 MG/5ML IV SOLN
0.4000 mg | Freq: Once | INTRAVENOUS | Status: AC
  Administered 2011-04-17: 0.4 mg via INTRAVENOUS

## 2011-04-17 MED ORDER — TECHNETIUM TC 99M TETROFOSMIN IV KIT
33.0000 | PACK | Freq: Once | INTRAVENOUS | Status: AC | PRN
  Administered 2011-04-17: 33 via INTRAVENOUS

## 2011-04-17 NOTE — Progress Notes (Signed)
[facility] SITE 3 NUCLEAR MED [address] [place] [address] [phone]  Cardiology Nuclear Med Study  Curtis Frazier is a 70 y.o. male [id number] [birth year]   Nuclear Med Background Indication for Stress Test:  Evaluation for Ischemia, Stent Patency and Surgical Clearance Pending Back surgery by Dr. Lovell Sheehan  History:  COPD and '10 STEMI>Stent-CFX; '11 MPS:No ischemia, EF=71% Cardiac Risk Factors: Carotid Disease, Family History - CAD, History of Smoking, Hypertension, Lipids and NIDDM  Symptoms:  Chest Tightness (last date of chest discomfort was about 68-months ago) and DOE   Nuclear Pre-Procedure Caffeine/Decaff Intake:  None NPO After: 8:00pm   Lungs:  Clear IV 0.9% NS with Angio Cath:  20g  IV Site: R Antecubital  IV Started by:  Stanton Kidney, EMT-P  Chest Size (in):  42 Cup Size: n/a  Height: 5\' 9"  (1.753 m)  Weight:  175 lb (79.379 kg)  BMI:  Body mass index is 25.84 kg/(m^2). Tech Comments:  Meds taken as usual last night, none this am, per patient.    Nuclear Med Study 1 or 2 day study: 1 day  Stress Test Type:  Treadmill/Lexiscan  Reading MD: Olga Millers, MD  Order Authorizing Provider:  Charlton Haws, MD  Resting Radionuclide: Technetium 78m Tetrofosmin  Resting Radionuclide Dose: 11.0 mCi   Stress Radionuclide:  Technetium 55m Tetrofosmin  Stress Radionuclide Dose: 33.0 mCi           Stress Protocol Rest HR: 54 Stress HR: 96  Rest BP: Sitting:118/64  Standing:121/75 Stress BP: 177/61  Exercise Time (min): 7:00 METS: 6.5   Predicted Max HR: 149 bpm % Max HR: 64.43 bpm Rate Pressure Product: 21308   Dose of Adenosine (mg):  n/a Dose of Lexiscan: 0.4 mg  Dose of Atropine (mg): n/a Dose of Dobutamine: n/a mcg/kg/min (at max HR)  Stress Test Technologist: Smiley Houseman, CMA-N  Nuclear Technologist:  Doyne Keel, CNMT     Rest Procedure:  Myocardial perfusion imaging was performed at rest 45 minutes following the intravenous  administration of Technetium 41m Tetrofosmin.  Rest ECG: No acute changes.  Stress Procedure: The patient initially walked the treadmill for 5-minutes utilizing the Bruce protocol, but was unable to obtain his target heart rate. He was then given IV Lexiscan 0.4 mg over 15-seconds with concurrent low level exercise and then Technetium 51m Tetrofosmin was injected at 30-seconds while the patient continued walking one more minute.  There were no diagnostic ST-T wave changes with Lexiscan, only occasional PAC's.  Quantitative spect images were obtained after a 45-minute delay.  Stress ECG: No significant ST segment change suggestive of ischemia.  QPS Raw Data Images:  Acquisition technically good; normal left ventricular size. Stress Images:  There is decreased uptake in the apex. Rest Images:  There is decreased uptake in the apex. Subtraction (SDS):  No evidence of ischemia. Transient Ischemic Dilatation (Normal <1.22):  1.01 Lung/Heart Ratio (Normal <0.45):  0.36  Quantitative Gated Spect Images QGS EDV:  98 ml QGS ESV:  26 ml QGS cine images:  NL LV Function; NL Wall Motion QGS EF: 74%  Impression Exercise Capacity:  Lexiscan with low level exercise. BP Response:  Normal blood pressure response. Clinical Symptoms:  No chest pain. ECG Impression:  No significant ST segment change suggestive of ischemia. Comparison with Prior Nuclear Study: No significant change from previous study  Overall Impression:  Normal stress nuclear study with a small fixed apical defect suggestive of thinning; no ischemia.  Olga Millers

## 2011-05-01 ENCOUNTER — Telehealth: Payer: Self-pay | Admitting: Cardiovascular Disease

## 2011-05-01 NOTE — Telephone Encounter (Signed)
Pt has a absess on his tooth and he needs to get off blood thinners to get it pulled

## 2011-05-02 NOTE — Telephone Encounter (Signed)
Patients wife was told ok for patient to hold Plavix 5 to 7 days before tooth pulled.

## 2011-05-02 NOTE — Telephone Encounter (Signed)
FU Call: Pt returning call from Debra yesterday. Please return pt call to discuss further.

## 2011-05-03 NOTE — Telephone Encounter (Signed)
Okay given for pt to hold plavix Deliah Goody

## 2011-05-12 ENCOUNTER — Telehealth: Payer: Self-pay | Admitting: Cardiovascular Disease

## 2011-05-12 NOTE — Telephone Encounter (Signed)
Fu call °Pt returning your call  °

## 2011-05-12 NOTE — Telephone Encounter (Signed)
No answer. Will continue to try to reach pt.

## 2011-05-12 NOTE — Telephone Encounter (Signed)
Left message to call back  

## 2011-05-15 NOTE — Telephone Encounter (Signed)
Patient called,no answer.LMTC 

## 2011-05-15 NOTE — Telephone Encounter (Signed)
Pt rtning call from friday

## 2011-05-16 NOTE — Telephone Encounter (Signed)
Patient was called,stated he already heard about  results of test. Stated he did not know why we were calling.Stated he was doing good.Advised to call back if needed.

## 2011-05-18 ENCOUNTER — Telehealth: Payer: Self-pay | Admitting: *Deleted

## 2011-05-18 NOTE — Telephone Encounter (Signed)
Darl Pikes from Dr Delma Officer' office is calling about pt's surgical clearance.  The pt is calling to schedule but they have not received a clearance as of yet.  Pt did recently have a normal nuc study.  Susan's phone number is [phone]  Fax number [phone]

## 2011-05-19 NOTE — Telephone Encounter (Signed)
CLEARANCE FAXED TO DR Lovell Sheehan OFFICE LATE YESTERDAY  LEFT MESSAGE WITH MANDY RE IF ALSO NEEDS NOTE IF SO WAS INSTRUCTED TO CALL BACK AND LET ME KNOW .Zack Seal

## 2011-05-19 NOTE — Telephone Encounter (Signed)
Cardiologist indicated he could be medically cleared if myoview was ok -- I can't find it --but I think it is in computer ... Please let me know where it is - and I will review and sign form thanks

## 2011-05-19 NOTE — Telephone Encounter (Signed)
Patient's wife notified as instructed by telephone.Pt to call back next week to see if needs to come in for fasting labs and f/u with Dr Milinda Antis for medical clearance. There is a note in my basket on my desk. I tried calling Darl Pikes from Dr Lovell Sheehan office and answering service said to call back Monday.

## 2011-05-19 NOTE — Telephone Encounter (Signed)
ERROR  ON BELOW MESSAGE  NOTE WAS FAXED TO SUSAN AT [facility] NOT DR Lovell Sheehan .Zack Seal

## 2011-05-19 NOTE — Telephone Encounter (Signed)
Has this been faxed.  Darl Pikes called today and stated they haven't received it.

## 2011-05-25 ENCOUNTER — Other Ambulatory Visit: Payer: Self-pay | Admitting: Neurosurgery

## 2011-05-25 NOTE — Telephone Encounter (Signed)
Darl Pikes at Dr Lovell Sheehan office notified. Pt notified also. Appt for pt having medical clearance 05/31/11 cancelled.

## 2011-05-25 NOTE — Telephone Encounter (Signed)
Dr Milinda Antis spoke with Curtis Frazier at Dr Lovell Sheehan office and she faxed me a sheet for medical clearance that it does appear you have already signed. I put this on your shelf in the in box for you to review.Thank you.

## 2011-05-25 NOTE — Telephone Encounter (Signed)
Thanks - I reviewed his cardiology notes in detail - now I can go ahead and clear him -- so fax form please He no longer needs a preop visit with me

## 2011-05-25 NOTE — Telephone Encounter (Signed)
Spoke with susan at Dr Lovell Sheehan office and she has already received cardiology clearance and said had letter from Dr Milinda Antis clearing pt as well. Darl Pikes will fax Dr Royden Purl clearance letter back to me for verification.

## 2011-05-31 ENCOUNTER — Ambulatory Visit: Payer: Medicare Other | Admitting: Family Medicine

## 2011-06-13 HISTORY — PX: LAMINECTOMY THORACIC FUSION POSTERIOR: SHX5887

## 2011-06-16 ENCOUNTER — Encounter (HOSPITAL_COMMUNITY): Payer: Self-pay

## 2011-06-23 ENCOUNTER — Other Ambulatory Visit (HOSPITAL_COMMUNITY): Payer: Medicare Other

## 2011-06-23 ENCOUNTER — Encounter (HOSPITAL_COMMUNITY)
Admission: RE | Admit: 2011-06-23 | Discharge: 2011-06-23 | Disposition: A | Discharge status: Home / Self Care | Payer: Medicare Other | Source: Ambulatory Visit | Attending: Neurosurgery | Admitting: Neurosurgery

## 2011-06-23 ENCOUNTER — Encounter (HOSPITAL_COMMUNITY): Payer: Self-pay

## 2011-06-23 ENCOUNTER — Other Ambulatory Visit: Payer: Self-pay | Admitting: Dermatology

## 2011-06-23 ENCOUNTER — Ambulatory Visit (HOSPITAL_COMMUNITY)
Admission: RE | Admit: 2011-06-23 | Discharge: 2011-06-23 | Disposition: A | Discharge status: Home / Self Care | Payer: Medicare Other | Source: Ambulatory Visit | Attending: Anesthesiology | Admitting: Anesthesiology

## 2011-06-23 DIAGNOSIS — Z01812 Encounter for preprocedural laboratory examination: Secondary | ICD-10-CM | POA: Insufficient documentation

## 2011-06-23 DIAGNOSIS — IMO0002 Reserved for concepts with insufficient information to code with codable children: Secondary | ICD-10-CM | POA: Insufficient documentation

## 2011-06-23 DIAGNOSIS — M949 Disorder of cartilage, unspecified: Secondary | ICD-10-CM | POA: Insufficient documentation

## 2011-06-23 DIAGNOSIS — Z01818 Encounter for other preprocedural examination: Secondary | ICD-10-CM | POA: Insufficient documentation

## 2011-06-23 DIAGNOSIS — M899 Disorder of bone, unspecified: Secondary | ICD-10-CM | POA: Insufficient documentation

## 2011-06-23 HISTORY — DX: Other chronic pain: G89.29

## 2011-06-23 HISTORY — DX: Other skin changes: R23.8

## 2011-06-23 HISTORY — DX: Pain in unspecified joint: M25.50

## 2011-06-23 HISTORY — DX: Hyperlipidemia, unspecified: E78.5

## 2011-06-23 HISTORY — DX: Pneumonia, unspecified organism: J18.9

## 2011-06-23 HISTORY — DX: Personal history of colonic polyps: Z86.010

## 2011-06-23 HISTORY — DX: Spontaneous ecchymoses: R23.3

## 2011-06-23 HISTORY — DX: Dorsalgia, unspecified: M54.9

## 2011-06-23 HISTORY — DX: Psoriasis, unspecified: L40.9

## 2011-06-23 HISTORY — DX: Personal history of colon polyps, unspecified: Z86.0100

## 2011-06-23 LAB — CBC
HCT: 37.2 % — ABNORMAL LOW (ref 39.0–52.0)
MCHC: 34.1 g/dL (ref 30.0–36.0)
MCV: 94.9 fL (ref 78.0–100.0)
RDW: 13.3 % (ref 11.5–15.5)

## 2011-06-23 LAB — BASIC METABOLIC PANEL
BUN: 23 mg/dL (ref 6–23)
CO2: 26 mEq/L (ref 19–32)
Chloride: 104 mEq/L (ref 96–112)
Creatinine, Ser: 1.48 mg/dL — ABNORMAL HIGH (ref 0.50–1.35)

## 2011-06-23 NOTE — Progress Notes (Signed)
Has a rheumatologist at [facility]

## 2011-06-23 NOTE — Pre-Procedure Instructions (Signed)
20 Curtis Frazier  06/23/2011   Your procedure is scheduled on:  Mon,Jan 21 @ 0730  Report to [facility] at 0530 AM.  Call this number if you have problems the morning of surgery: [phone]   Remember:   Do not eat food:After Midnight.  May have clear liquids: up to 4 Hours before arrival.(until 1:30 am)  Clear liquids include soda, tea, black coffee, apple or grape juice, broth.  Take these medicines the morning of surgery with A SIP OF WATER: Pain Pill(if needed),Toprol,and Omeprazole   Do not wear jewelry, make-up or nail polish.  Do not wear lotions, powders, or perfumes. You may wear deodorant.  Do not shave 48 hours prior to surgery.  Do not bring valuables to the hospital.  Contacts, dentures or bridgework may not be worn into surgery.  Leave suitcase in the car. After surgery it may be brought to your room.  For patients admitted to the hospital, checkout time is 11:00 AM the day of discharge.   Patients discharged the day of surgery will not be allowed to drive home.  Name and phone number of your driver:   Special Instructions: CHG Shower Use Special Wash: 1/2 bottle night before surgery and 1/2 bottle morning of surgery.   Please read over the following fact sheets that you were given: Pain Booklet, Coughing and Deep Breathing, Blood Transfusion Information, MRSA Information and Surgical Site Infection Prevention

## 2011-06-23 NOTE — Progress Notes (Signed)
Cardiologist is dr.nishan and last visit in Oct 2012 and is aware of this surgery  Stress test done 04/17/11 Echo done about a yr ago Heart cath in 2010 done at [facility] request record

## 2011-06-23 NOTE — Progress Notes (Signed)
Pt having some sinus drainage recently-takes OTC Allegra;states that occasionally feels like a "lump in his throat".

## 2011-07-02 MED ORDER — VANCOMYCIN HCL IN DEXTROSE 1-5 GM/200ML-% IV SOLN
1000.0000 mg | INTRAVENOUS | Status: DC
  Filled 2011-07-02: qty 200

## 2011-07-03 ENCOUNTER — Inpatient Hospital Stay (HOSPITAL_COMMUNITY)
Admission: RE | Admit: 2011-07-03 | Discharge: 2011-07-11 | DRG: 456 | Disposition: A | Discharge status: Rehabilitation Facility | Payer: Medicare Other | Source: Ambulatory Visit | Attending: Neurosurgery | Admitting: Neurosurgery

## 2011-07-03 ENCOUNTER — Inpatient Hospital Stay (HOSPITAL_COMMUNITY): Payer: Medicare Other

## 2011-07-03 ENCOUNTER — Encounter (HOSPITAL_COMMUNITY): Payer: Self-pay | Admitting: Anesthesiology

## 2011-07-03 ENCOUNTER — Encounter (HOSPITAL_COMMUNITY)
Admission: RE | Disposition: A | Discharge status: Rehabilitation Facility | Payer: Self-pay | Source: Ambulatory Visit | Attending: Neurosurgery

## 2011-07-03 ENCOUNTER — Inpatient Hospital Stay (HOSPITAL_COMMUNITY): Payer: Medicare Other | Admitting: Anesthesiology

## 2011-07-03 DIAGNOSIS — IMO0002 Reserved for concepts with insufficient information to code with codable children: Secondary | ICD-10-CM | POA: Diagnosis not present

## 2011-07-03 DIAGNOSIS — Z833 Family history of diabetes mellitus: Secondary | ICD-10-CM

## 2011-07-03 DIAGNOSIS — Z7982 Long term (current) use of aspirin: Secondary | ICD-10-CM

## 2011-07-03 DIAGNOSIS — D72829 Elevated white blood cell count, unspecified: Secondary | ICD-10-CM | POA: Diagnosis not present

## 2011-07-03 DIAGNOSIS — Z860100 Personal history of colon polyps, unspecified: Secondary | ICD-10-CM

## 2011-07-03 DIAGNOSIS — G8929 Other chronic pain: Secondary | ICD-10-CM | POA: Diagnosis present

## 2011-07-03 DIAGNOSIS — Z87442 Personal history of urinary calculi: Secondary | ICD-10-CM

## 2011-07-03 DIAGNOSIS — Z872 Personal history of diseases of the skin and subcutaneous tissue: Secondary | ICD-10-CM

## 2011-07-03 DIAGNOSIS — Z800 Family history of malignant neoplasm of digestive organs: Secondary | ICD-10-CM

## 2011-07-03 DIAGNOSIS — R5082 Postprocedural fever: Secondary | ICD-10-CM | POA: Diagnosis not present

## 2011-07-03 DIAGNOSIS — N289 Disorder of kidney and ureter, unspecified: Secondary | ICD-10-CM | POA: Diagnosis not present

## 2011-07-03 DIAGNOSIS — I252 Old myocardial infarction: Secondary | ICD-10-CM

## 2011-07-03 DIAGNOSIS — N9989 Other postprocedural complications and disorders of genitourinary system: Secondary | ICD-10-CM | POA: Diagnosis not present

## 2011-07-03 DIAGNOSIS — I2510 Atherosclerotic heart disease of native coronary artery without angina pectoris: Secondary | ICD-10-CM | POA: Diagnosis present

## 2011-07-03 DIAGNOSIS — I4891 Unspecified atrial fibrillation: Secondary | ICD-10-CM | POA: Diagnosis not present

## 2011-07-03 DIAGNOSIS — I10 Essential (primary) hypertension: Secondary | ICD-10-CM | POA: Diagnosis present

## 2011-07-03 DIAGNOSIS — D62 Acute posthemorrhagic anemia: Secondary | ICD-10-CM | POA: Diagnosis not present

## 2011-07-03 DIAGNOSIS — M51379 Other intervertebral disc degeneration, lumbosacral region without mention of lumbar back pain or lower extremity pain: Secondary | ICD-10-CM | POA: Diagnosis present

## 2011-07-03 DIAGNOSIS — E119 Type 2 diabetes mellitus without complications: Secondary | ICD-10-CM | POA: Diagnosis present

## 2011-07-03 DIAGNOSIS — K219 Gastro-esophageal reflux disease without esophagitis: Secondary | ICD-10-CM | POA: Diagnosis present

## 2011-07-03 DIAGNOSIS — M48061 Spinal stenosis, lumbar region without neurogenic claudication: Secondary | ICD-10-CM | POA: Diagnosis present

## 2011-07-03 DIAGNOSIS — I359 Nonrheumatic aortic valve disorder, unspecified: Secondary | ICD-10-CM | POA: Diagnosis present

## 2011-07-03 DIAGNOSIS — M4120 Other idiopathic scoliosis, site unspecified: Principal | ICD-10-CM | POA: Diagnosis present

## 2011-07-03 DIAGNOSIS — Z7902 Long term (current) use of antithrombotics/antiplatelets: Secondary | ICD-10-CM

## 2011-07-03 DIAGNOSIS — M5137 Other intervertebral disc degeneration, lumbosacral region: Secondary | ICD-10-CM | POA: Diagnosis present

## 2011-07-03 DIAGNOSIS — Z883 Allergy status to other anti-infective agents status: Secondary | ICD-10-CM

## 2011-07-03 DIAGNOSIS — E785 Hyperlipidemia, unspecified: Secondary | ICD-10-CM | POA: Diagnosis present

## 2011-07-03 DIAGNOSIS — Z880 Allergy status to penicillin: Secondary | ICD-10-CM

## 2011-07-03 DIAGNOSIS — Z8701 Personal history of pneumonia (recurrent): Secondary | ICD-10-CM

## 2011-07-03 DIAGNOSIS — Z87891 Personal history of nicotine dependence: Secondary | ICD-10-CM

## 2011-07-03 DIAGNOSIS — Z8249 Family history of ischemic heart disease and other diseases of the circulatory system: Secondary | ICD-10-CM

## 2011-07-03 DIAGNOSIS — J9600 Acute respiratory failure, unspecified whether with hypoxia or hypercapnia: Secondary | ICD-10-CM | POA: Diagnosis not present

## 2011-07-03 DIAGNOSIS — R339 Retention of urine, unspecified: Secondary | ICD-10-CM | POA: Diagnosis not present

## 2011-07-03 DIAGNOSIS — I959 Hypotension, unspecified: Secondary | ICD-10-CM | POA: Diagnosis not present

## 2011-07-03 DIAGNOSIS — Z9861 Coronary angioplasty status: Secondary | ICD-10-CM

## 2011-07-03 DIAGNOSIS — Z8042 Family history of malignant neoplasm of prostate: Secondary | ICD-10-CM

## 2011-07-03 DIAGNOSIS — M069 Rheumatoid arthritis, unspecified: Secondary | ICD-10-CM | POA: Diagnosis present

## 2011-07-03 LAB — GLUCOSE, CAPILLARY: Glucose-Capillary: 137 mg/dL — ABNORMAL HIGH (ref 70–99)

## 2011-07-03 SURGERY — POSTERIOR LUMBAR FUSION 3 LEVEL
Anesthesia: General | Site: Back | Wound class: Clean

## 2011-07-03 MED ORDER — BACITRACIN 50000 UNITS IM SOLR
INTRAMUSCULAR | Status: AC
  Filled 2011-07-03: qty 1

## 2011-07-03 MED ORDER — ALBUMIN HUMAN 5 % IV SOLN
INTRAVENOUS | Status: DC | PRN
  Administered 2011-07-03: 13:00:00 via INTRAVENOUS

## 2011-07-03 MED ORDER — LABETALOL HCL 5 MG/ML IV SOLN: 5.0000 mg | INTRAVENOUS | Status: DC | PRN

## 2011-07-03 MED ORDER — DOCUSATE SODIUM 100 MG PO CAPS
100.0000 mg | ORAL_CAPSULE | Freq: Two times a day (BID) | ORAL | Status: DC
  Administered 2011-07-03 – 2011-07-11 (×14): 100 mg via ORAL
  Filled 2011-07-03 (×17): qty 1

## 2011-07-03 MED ORDER — THROMBIN 20000 UNITS EX KIT
PACK | CUTANEOUS | Status: DC | PRN
  Administered 2011-07-03 (×2): 20000 [IU] via TOPICAL

## 2011-07-03 MED ORDER — ZOLPIDEM TARTRATE 5 MG PO TABS: 5.0000 mg | ORAL_TABLET | Freq: Every evening | ORAL | Status: DC | PRN

## 2011-07-03 MED ORDER — LACTATED RINGERS IV SOLN
INTRAVENOUS | Status: DC | PRN
  Administered 2011-07-03 (×3): via INTRAVENOUS

## 2011-07-03 MED ORDER — GLYCOPYRROLATE 0.2 MG/ML IJ SOLN
INTRAMUSCULAR | Status: DC | PRN
  Administered 2011-07-03: .8 mg via INTRAVENOUS

## 2011-07-03 MED ORDER — VANCOMYCIN HCL IN DEXTROSE 1-5 GM/200ML-% IV SOLN
1000.0000 mg | Freq: Two times a day (BID) | INTRAVENOUS | Status: AC
  Administered 2011-07-03: 1000 mg via INTRAVENOUS
  Filled 2011-07-03: qty 200

## 2011-07-03 MED ORDER — VANCOMYCIN HCL 1000 MG IV SOLR
1000.0000 mg | INTRAVENOUS | Status: DC | PRN
  Administered 2011-07-03: 1000 mg via INTRAVENOUS

## 2011-07-03 MED ORDER — NALOXONE HCL 0.4 MG/ML IJ SOLN: 0.4000 mg | INTRAMUSCULAR | Status: DC | PRN

## 2011-07-03 MED ORDER — SODIUM CHLORIDE 0.9 % IV SOLN
INTRAVENOUS | Status: AC
  Filled 2011-07-03: qty 500

## 2011-07-03 MED ORDER — SODIUM CHLORIDE 0.9 % IV SOLN
10.0000 mg | INTRAVENOUS | Status: DC | PRN
  Administered 2011-07-03: 1 ug/min via INTRAVENOUS

## 2011-07-03 MED ORDER — ACETAMINOPHEN 650 MG RE SUPP: 650.0000 mg | RECTAL | Status: DC | PRN

## 2011-07-03 MED ORDER — BUPIVACAINE LIPOSOME 1.3 % IJ SUSP
20.0000 mL | Freq: Once | INTRAMUSCULAR | Status: AC
  Administered 2011-07-03: 20 mL
  Filled 2011-07-03: qty 20

## 2011-07-03 MED ORDER — EPHEDRINE SULFATE 50 MG/ML IJ SOLN
INTRAMUSCULAR | Status: DC | PRN
  Administered 2011-07-03 (×3): 10 mg via INTRAVENOUS

## 2011-07-03 MED ORDER — HETASTARCH-ELECTROLYTES 6 % IV SOLN
INTRAVENOUS | Status: DC | PRN
  Administered 2011-07-03: 10:00:00 via INTRAVENOUS

## 2011-07-03 MED ORDER — ZOLPIDEM TARTRATE 5 MG PO TABS: 10.0000 mg | ORAL_TABLET | Freq: Every evening | ORAL | Status: DC | PRN

## 2011-07-03 MED ORDER — MORPHINE SULFATE (PF) 1 MG/ML IV SOLN
INTRAVENOUS | Status: DC
  Administered 2011-07-03: 1.5 mg via INTRAVENOUS
  Administered 2011-07-03: 7.5 mg via INTRAVENOUS
  Administered 2011-07-04: 8.39 mg via INTRAVENOUS
  Administered 2011-07-04: 13.5 mg via INTRAVENOUS
  Administered 2011-07-04: 4.5 mg via INTRAVENOUS
  Administered 2011-07-04: 9 mg via INTRAVENOUS
  Administered 2011-07-04 (×2): via INTRAVENOUS
  Administered 2011-07-04: 4.49 mg via INTRAVENOUS
  Administered 2011-07-04: 1.5 mg via INTRAVENOUS
  Administered 2011-07-05: 06:00:00 via INTRAVENOUS
  Administered 2011-07-05: 3 mg via INTRAVENOUS
  Administered 2011-07-05: 13.5 mg via INTRAVENOUS
  Administered 2011-07-05: 23:00:00 via INTRAVENOUS
  Administered 2011-07-05: 11.98 mg via INTRAVENOUS
  Administered 2011-07-05: 9 mg via INTRAVENOUS
  Administered 2011-07-05: 1.5 mg via INTRAVENOUS
  Administered 2011-07-05: 3 mg via INTRAVENOUS
  Administered 2011-07-06: 11:00:00 via INTRAVENOUS
  Administered 2011-07-06: 4.5 mg via INTRAVENOUS
  Administered 2011-07-06: 15 mg via INTRAVENOUS
  Administered 2011-07-06: 14.8 mg via INTRAVENOUS
  Administered 2011-07-06: 13.5 mg via INTRAVENOUS
  Filled 2011-07-03 (×7): qty 25

## 2011-07-03 MED ORDER — OXYCODONE-ACETAMINOPHEN 5-325 MG PO TABS
1.0000 | ORAL_TABLET | ORAL | Status: DC | PRN
  Administered 2011-07-04: 2 via ORAL
  Administered 2011-07-04: 1 via ORAL
  Filled 2011-07-03: qty 1
  Filled 2011-07-03: qty 2

## 2011-07-03 MED ORDER — SODIUM CHLORIDE 0.9 % IR SOLN
Status: DC | PRN
  Administered 2011-07-03 (×3)

## 2011-07-03 MED ORDER — LABETALOL HCL 5 MG/ML IV SOLN
INTRAVENOUS | Status: AC
  Administered 2011-07-03: 20 mg
  Filled 2011-07-03: qty 4

## 2011-07-03 MED ORDER — HEMOSTATIC AGENTS (NO CHARGE) OPTIME
TOPICAL | Status: DC | PRN
  Administered 2011-07-03 (×2): 1 via TOPICAL

## 2011-07-03 MED ORDER — BUPIVACAINE-EPINEPHRINE PF 0.5-1:200000 % IJ SOLN
INTRAMUSCULAR | Status: DC | PRN
  Administered 2011-07-03: 20 mL

## 2011-07-03 MED ORDER — SODIUM CHLORIDE 0.9 % IJ SOLN: 9.0000 mL | INTRAMUSCULAR | Status: DC | PRN

## 2011-07-03 MED ORDER — ONDANSETRON HCL 4 MG/2ML IJ SOLN
4.0000 mg | INTRAMUSCULAR | Status: DC | PRN
  Administered 2011-07-04: 4 mg via INTRAVENOUS
  Filled 2011-07-03: qty 2

## 2011-07-03 MED ORDER — ONDANSETRON HCL 4 MG/2ML IJ SOLN: 4.0000 mg | Freq: Once | INTRAMUSCULAR | Status: DC | PRN

## 2011-07-03 MED ORDER — SUFENTANIL CITRATE 50 MCG/ML IV SOLN
INTRAVENOUS | Status: DC | PRN
  Administered 2011-07-03: 10 ug via INTRAVENOUS
  Administered 2011-07-03: 30 ug via INTRAVENOUS
  Administered 2011-07-03 (×5): 10 ug via INTRAVENOUS

## 2011-07-03 MED ORDER — PROPOFOL 10 MG/ML IV EMUL
INTRAVENOUS | Status: DC | PRN
  Administered 2011-07-03: 150 mg via INTRAVENOUS

## 2011-07-03 MED ORDER — PHENOL 1.4 % MT LIQD
1.0000 | OROMUCOSAL | Status: DC | PRN
  Administered 2011-07-08: 1 via OROMUCOSAL
  Filled 2011-07-03: qty 177

## 2011-07-03 MED ORDER — BACITRACIN ZINC 500 UNIT/GM EX OINT
TOPICAL_OINTMENT | CUTANEOUS | Status: DC | PRN
  Administered 2011-07-03: 1 via TOPICAL

## 2011-07-03 MED ORDER — DIAZEPAM 5 MG PO TABS
5.0000 mg | ORAL_TABLET | Freq: Four times a day (QID) | ORAL | Status: DC | PRN
  Administered 2011-07-04 – 2011-07-07 (×2): 5 mg via ORAL
  Filled 2011-07-03 (×2): qty 1

## 2011-07-03 MED ORDER — DIPHENHYDRAMINE HCL 50 MG/ML IJ SOLN: 12.5000 mg | Freq: Four times a day (QID) | INTRAMUSCULAR | Status: DC | PRN

## 2011-07-03 MED ORDER — HYDROMORPHONE HCL PF 1 MG/ML IJ SOLN
0.2500 mg | INTRAMUSCULAR | Status: DC | PRN
  Administered 2011-07-03 (×2): 0.5 mg via INTRAVENOUS

## 2011-07-03 MED ORDER — NEOSTIGMINE METHYLSULFATE 1 MG/ML IJ SOLN
INTRAMUSCULAR | Status: DC | PRN
  Administered 2011-07-03: 5 mg via INTRAVENOUS

## 2011-07-03 MED ORDER — ONDANSETRON HCL 4 MG/2ML IJ SOLN: 4.0000 mg | Freq: Four times a day (QID) | INTRAMUSCULAR | Status: DC | PRN

## 2011-07-03 MED ORDER — DIPHENHYDRAMINE HCL 12.5 MG/5ML PO ELIX
12.5000 mg | ORAL_SOLUTION | Freq: Four times a day (QID) | ORAL | Status: DC | PRN
  Filled 2011-07-03: qty 5

## 2011-07-03 MED ORDER — DEXAMETHASONE SODIUM PHOSPHATE 10 MG/ML IJ SOLN
INTRAMUSCULAR | Status: DC | PRN
  Administered 2011-07-03: 8 mg via INTRAVENOUS

## 2011-07-03 MED ORDER — LIDOCAINE HCL (CARDIAC) 20 MG/ML IV SOLN
INTRAVENOUS | Status: DC | PRN
  Administered 2011-07-03: 100 mg via INTRAVENOUS

## 2011-07-03 MED ORDER — MENTHOL 3 MG MT LOZG: 1.0000 | LOZENGE | OROMUCOSAL | Status: DC | PRN

## 2011-07-03 MED ORDER — SODIUM CHLORIDE 0.9 % IV SOLN
INTRAVENOUS | Status: DC | PRN
  Administered 2011-07-03: 15:00:00 via INTRAVENOUS

## 2011-07-03 MED ORDER — LACTATED RINGERS IV SOLN
INTRAVENOUS | Status: DC
  Administered 2011-07-03 – 2011-07-05 (×3): via INTRAVENOUS

## 2011-07-03 MED ORDER — ROCURONIUM BROMIDE 100 MG/10ML IV SOLN
INTRAVENOUS | Status: DC | PRN
  Administered 2011-07-03 (×3): 50 mg via INTRAVENOUS
  Administered 2011-07-03: 30 mg via INTRAVENOUS

## 2011-07-03 MED ORDER — 0.9 % SODIUM CHLORIDE (POUR BTL) OPTIME
TOPICAL | Status: DC | PRN
  Administered 2011-07-03: 1000 mL

## 2011-07-03 MED ORDER — ACETAMINOPHEN 325 MG PO TABS
650.0000 mg | ORAL_TABLET | ORAL | Status: DC | PRN
  Administered 2011-07-04 – 2011-07-06 (×5): 650 mg via ORAL
  Filled 2011-07-03 (×5): qty 2

## 2011-07-03 SURGICAL SUPPLY — 81 items
APL SKNCLS STERI-STRIP NONHPOA (GAUZE/BANDAGES/DRESSINGS) ×1
BAG DECANTER FOR FLEXI CONT (MISCELLANEOUS) ×2 IMPLANT
BENZOIN TINCTURE PRP APPL 2/3 (GAUZE/BANDAGES/DRESSINGS) ×2 IMPLANT
BLADE SURG ROTATE 9660 (MISCELLANEOUS) IMPLANT
BRUSH SCRUB EZ PLAIN DRY (MISCELLANEOUS) ×2 IMPLANT
BUR ACORN 6.0 (BURR) ×4 IMPLANT
BUR MATCHSTICK NEURO 3.0 LAGG (BURR) ×2 IMPLANT
CANISTER SUCTION 2500CC (MISCELLANEOUS) ×2 IMPLANT
CAP REVERE LOCKING (Cap) ×30 IMPLANT
CLOSURE STERI STRIP 1/2 X4 (GAUZE/BANDAGES/DRESSINGS) ×2 IMPLANT
CLOTH BEACON ORANGE TIMEOUT ST (SAFETY) ×2 IMPLANT
CONN CROSSLINK REV 6.35 48-60 (Connector) ×2 IMPLANT
CONNECTOR CRSLNK REV6.35 48-60 (Connector) ×1 IMPLANT
CONT SPEC 4OZ CLIKSEAL STRL BL (MISCELLANEOUS) ×4 IMPLANT
COVER BACK TABLE 24X17X13 BIG (DRAPES) ×2 IMPLANT
DRAPE C-ARM 42X72 X-RAY (DRAPES) ×6 IMPLANT
DRAPE LAPAROTOMY 100X72X124 (DRAPES) ×2 IMPLANT
DRAPE POUCH INSTRU U-SHP 10X18 (DRAPES) ×2 IMPLANT
DRAPE PROXIMA HALF (DRAPES) ×2 IMPLANT
DRAPE SURG 17X23 STRL (DRAPES) ×8 IMPLANT
ELECT BLADE 4.0 EZ CLEAN MEGAD (MISCELLANEOUS) ×2
ELECT REM PT RETURN 9FT ADLT (ELECTROSURGICAL) ×2
ELECTRODE BLDE 4.0 EZ CLN MEGD (MISCELLANEOUS) ×1 IMPLANT
ELECTRODE REM PT RTRN 9FT ADLT (ELECTROSURGICAL) ×1 IMPLANT
GAUZE SPONGE 4X4 12PLY STRL LF (GAUZE/BANDAGES/DRESSINGS) ×2 IMPLANT
GAUZE SPONGE 4X4 16PLY XRAY LF (GAUZE/BANDAGES/DRESSINGS) ×2 IMPLANT
GLOVE BIO SURGEON STRL SZ7 (GLOVE) ×2 IMPLANT
GLOVE BIO SURGEON STRL SZ8.5 (GLOVE) ×4 IMPLANT
GLOVE BIOGEL PI IND STRL 7.5 (GLOVE) ×1 IMPLANT
GLOVE BIOGEL PI IND STRL 8 (GLOVE) ×2 IMPLANT
GLOVE BIOGEL PI INDICATOR 7.5 (GLOVE) ×1
GLOVE BIOGEL PI INDICATOR 8 (GLOVE) ×2
GLOVE ECLIPSE 7.5 STRL STRAW (GLOVE) ×8 IMPLANT
GLOVE EXAM NITRILE LRG STRL (GLOVE) IMPLANT
GLOVE EXAM NITRILE MD LF STRL (GLOVE) ×4 IMPLANT
GLOVE EXAM NITRILE XL STR (GLOVE) IMPLANT
GLOVE EXAM NITRILE XS STR PU (GLOVE) IMPLANT
GLOVE INDICATOR 7.5 STRL GRN (GLOVE) ×4 IMPLANT
GLOVE SS BIOGEL STRL SZ 7 (GLOVE) ×6 IMPLANT
GLOVE SS BIOGEL STRL SZ 8 (GLOVE) ×2 IMPLANT
GLOVE SUPERSENSE BIOGEL SZ 7 (GLOVE) ×6
GLOVE SUPERSENSE BIOGEL SZ 8 (GLOVE) ×2
GOWN BRE IMP SLV AUR LG STRL (GOWN DISPOSABLE) ×2 IMPLANT
GOWN BRE IMP SLV AUR XL STRL (GOWN DISPOSABLE) ×10 IMPLANT
GOWN STRL REIN 2XL LVL4 (GOWN DISPOSABLE) IMPLANT
KIT BASIN OR (CUSTOM PROCEDURE TRAY) ×2 IMPLANT
KIT INFUSE LRG II (Orthopedic Implant) ×2 IMPLANT
KIT ROOM TURNOVER OR (KITS) ×2 IMPLANT
NEEDLE HYPO 21X1.5 SAFETY (NEEDLE) ×2 IMPLANT
NEEDLE HYPO 22GX1.5 SAFETY (NEEDLE) ×2 IMPLANT
NS IRRIG 1000ML POUR BTL (IV SOLUTION) ×2 IMPLANT
PACK LAMINECTOMY NEURO (CUSTOM PROCEDURE TRAY) ×2 IMPLANT
PAD ARMBOARD 7.5X6 YLW CONV (MISCELLANEOUS) ×6 IMPLANT
PATTIES SURGICAL .5 X.5 (GAUZE/BANDAGES/DRESSINGS) ×2 IMPLANT
PATTIES SURGICAL .5 X1 (DISPOSABLE) IMPLANT
PATTIES SURGICAL 1X1 (DISPOSABLE) ×2 IMPLANT
PUTTY 10ML ACTIFUSE ABX (Putty) ×2 IMPLANT
PUTTY ABX ACTIFUSE 20ML (Putty) ×2 IMPLANT
ROD 6.35 300MM (Rod) ×4 IMPLANT
SCREW 7.5X50MM (Screw) ×8 IMPLANT
SCREW REVERE 6.35 75X55MM (Screw) ×6 IMPLANT
SPONGE GAUZE 4X4 12PLY (GAUZE/BANDAGES/DRESSINGS) ×2 IMPLANT
SPONGE LAP 4X18 X RAY DECT (DISPOSABLE) IMPLANT
SPONGE NEURO XRAY DETECT 1X3 (DISPOSABLE) IMPLANT
SPONGE SURGIFOAM ABS GEL 100 (HEMOSTASIS) ×4 IMPLANT
STRIP CLOSURE SKIN 1/2X4 (GAUZE/BANDAGES/DRESSINGS) ×2 IMPLANT
SUT VIC AB 1 CT1 18XBRD ANBCTR (SUTURE) ×3 IMPLANT
SUT VIC AB 1 CT1 8-18 (SUTURE) ×3
SUT VIC AB 2-0 CP2 18 (SUTURE) ×6 IMPLANT
SYR 20CC LL (SYRINGE) ×2 IMPLANT
SYR 20ML ECCENTRIC (SYRINGE) ×2 IMPLANT
TAPE CLOTH SURG 4X10 WHT LF (GAUZE/BANDAGES/DRESSINGS) ×2 IMPLANT
TOWEL OR 17X24 6PK STRL BLUE (TOWEL DISPOSABLE) ×2 IMPLANT
TOWEL OR 17X26 10 PK STRL BLUE (TOWEL DISPOSABLE) ×2 IMPLANT
TRAY FOLEY CATH 14FRSI W/METER (CATHETERS) ×2 IMPLANT
WATER STERILE IRR 1000ML POUR (IV SOLUTION) ×2 IMPLANT
globus revere 6.35 8.5 x55 (Screw) IMPLANT
globus revere 6.35 8.5x45 ×2 IMPLANT
globus revere 6.35 8.5x50 (Screw) ×2 IMPLANT
globus revere 6.35 9.0x55 (Screw) ×8 IMPLANT
revere 6.35 screw 8.5x55 (Screw) ×4 IMPLANT

## 2011-07-03 NOTE — Progress Notes (Signed)
Dr. Lovell Sheehan at bedside, aware about reddened areas to bilateral lateral chest from chest rolls.

## 2011-07-03 NOTE — H&P (Signed)
Subjective: The patient is a 71 year old white male who has suffered from chronic back and leg pain. He has failed medical management. He has been worked up with a lumbar MRI and myelo CT. This is demonstrated a significant thoracic lumbar scoliosis and degenerative changes. I discussed the situation with the patient. We have discussed the various treatment options including surgery. The patient has weighed the risks, benefits, and alternatives surgery was to proceed with a thoracolumbar decompression" and fusion.  Past Medical History  Diagnosis Date  . Other and unspecified hyperlipidemia   . Coronary atherosclerosis of unspecified type of vessel, native or graft   . Personal history of colonic polyps   . Actinic keratosis   . Psoriatic arthropathy   . Other psoriasis   . Scoliosis (and kyphoscoliosis), idiopathic   . GI bleed 12/10    cecam AVM  . Gastritis 12/10  . Hyperlipidemia     takes Pravastatin daily  . Hypertension     takes Metoprolol daily  . Myocardial infarction 2010  . Shortness of breath     with exertion  . Pneumonia     hx of-2008  . Headache     occasionally  . Joint pain   . Joint swelling   . Rheumatoid arthritis     takes Methotrexate 7pills weekly  . Degeneration of intervertebral disc, site unspecified   . Chronic back pain     scoliosis/stenosis/radiculopathy,degenerative disc disease  . Psoriasis   . Bruises easily   . Esophageal reflux     takes Omeprazole daily  . History of colonic polyps   . Hemorrhoids   . Kidney stone     hx of  . Blood transfusion 2010  . Type II or unspecified type diabetes mellitus without mention of complication, not stated as uncontrolled     type II;controlled by diet and exercise    Past Surgical History  Procedure Date  . Rotator cuff repair 2000    left   . Colonoscopy   . Vasectomy 1979  . Ptca stent 02/2009    has one stent  . Carotid doppler 10/12    0-39% R and 60-79% left   . Cataract surgery  2012  . Cardiac catheterization 2010  . Lithotripsy 2009    Allergies  Allergen Reactions  . Penicillins     REACTION: rash, swelling  . Tetracycline     REACTION: rash, swelling    History  Substance Use Topics  . Smoking status: Former Games developer  . Smokeless tobacco: Not on file   Comment: quit 2010  . Alcohol Use: No     occasional    Family History  Problem Relation Age of Onset  . Coronary artery disease Mother   . Diabetes Mother   . Heart disease Mother     CAD  . Coronary artery disease Sister   . Hypertension Sister   . Kidney failure Sister   . Heart disease Sister     CAD  . Pancreatic cancer Brother   . Diabetes Brother   . Prostate cancer Brother   . Cancer Brother     pancreatic CA  . Diabetes Brother   . Anesthesia problems Neg Hx   . Hypotension Neg Hx   . Malignant hyperthermia Neg Hx   . Pseudochol deficiency Neg Hx    Prior to Admission medications   Medication Sig Start Date End Date Taking? Authorizing Provider  aspirin 81 MG tablet Take 81 mg by mouth daily.  Yes Historical Provider, MD  Carboxymethylcellulose Sodium (REFRESH OP) Place 2-3 drops into both eyes daily as needed. For dry eyes.    Yes Historical Provider, MD  Cholecalciferol 1000 UNITS capsule Take 1,000 Units by mouth daily.     Yes Historical Provider, MD  CLOBETASOL PROPIONATE E 0.05 % emollient cream Apply 1 application topically 2 (two) times daily as needed. For skin rash. 04/03/11  Yes Historical Provider, MD  clopidogrel (PLAVIX) 75 MG tablet Take 1 tablet (75 mg total) by mouth daily. 12/20/10  Yes Roxy Manns, MD  fish oil-omega-3 fatty acids 1000 MG capsule Take 1 g by mouth daily.    Yes Historical Provider, MD  FOLIC ACID PO Take 1 tablet by mouth daily.    Yes Historical Provider, MD  HYDROcodone-acetaminophen (VICODIN) 5-500 MG per tablet Take 1 tablet by mouth 2 (two) times daily.    Yes Historical Provider, MD  methotrexate (RHEUMATREX) 2.5 MG tablet Take 17.5 mg by  mouth once a week. Caution:Chemotherapy. Protect from light.    Yes Historical Provider, MD  metoprolol succinate (TOPROL-XL) 25 MG 24 hr tablet Take 1 tablet (25 mg total) by mouth daily. 09/21/10  Yes Roxy Manns, MD  multivitamin [facility]) per tablet Take 1 tablet by mouth daily.     Yes Historical Provider, MD  omeprazole (PRILOSEC) 40 MG capsule Take 40 mg by mouth daily.     Yes Historical Provider, MD  pravastatin (PRAVACHOL) 10 MG tablet Take 1 tablet (10 mg total) by mouth daily. 11/25/10  Yes Roxy Manns, MD  nitroGLYCERIN (NITROSTAT) 0.4 MG SL tablet Place 0.4 mg under the tongue every 5 (five) minutes as needed. For chest pain.  04/14/11   Wendall Stade, MD     Review of Systems  Positive ROS: Negative except as above  All other systems have been reviewed and were otherwise negative with the exception of those mentioned in the HPI and as above.  Objective: Vital signs in last 24 hours: Temp:  [98.2 F (36.8 C)] 98.2 F (36.8 C) (01/21 1610) Pulse Rate:  [55] 55  (01/21 0638) Resp:  [18] 18  (01/21 0638) BP: (127)/(65) 127/65 mmHg (01/21 0638) SpO2:  [96 %] 96 % (01/21 9604)  General Appearance: Alert, cooperative, no distress, appears stated age Head: Normocephalic, without obvious abnormality, atraumatic Eyes: PERRL, conjunctiva/corneas clear, EOM's intact, fundi benign, both eyes      Ears: Normal TM's and external ear canals, both ears Throat: Lips, mucosa, and tongue normal; teeth and gums normal Neck: Supple, symmetrical, trachea midline, no adenopathy; thyroid: No enlargement/tenderness/nodules; no carotid bruit or JVD Back: Symmetric, no curvature, ROM normal, no CVA tenderness Lungs: Clear to auscultation bilaterally, respirations unlabored Heart: Regular rate and rhythm, S1 and S2 normal, no murmur, rub or gallop Abdomen: Soft, non-tender, bowel sounds active all four quadrants, no masses, no organomegaly Extremities: Extremities normal, atraumatic, no cyanosis  or edema Pulses: 2+ and symmetric all extremities Skin: Skin color, texture, turgor normal, no rashes or lesions  NEUROLOGIC:   Mental status: alert and oriented, no aphasia, good attention span, Fund of knowledge/ memory ok Motor Exam - grossly normal Sensory Exam - grossly normal Reflexes:  Coordination - grossly normal Gait - grossly normal Balance - grossly normal Cranial Nerves: I: smell Not tested  II: visual acuity  OS: Normal    OD: Normal   II: visual fields Full to confrontation  II: pupils Equal, round, reactive to light  III,VII: ptosis None  III,IV,VI: extraocular muscles  Full ROM  V: mastication Normal  V: facial light touch sensation  Normal  V,VII: corneal reflex  Present  VII: facial muscle function - upper  Normal  VII: facial muscle function - lower Normal  VIII: hearing Not tested  IX: soft palate elevation  Normal  IX,X: gag reflex Present  XI: trapezius strength  5/5  XI: sternocleidomastoid strength 5/5  XI: neck flexion strength  5/5  XII: tongue strength  Normal    Data Review Lab Results  Component Value Date   WBC 6.1 06/23/2011   HGB 12.7* 06/23/2011   HCT 37.2* 06/23/2011   MCV 94.9 06/23/2011   PLT 246 06/23/2011   Lab Results  Component Value Date   NA 139 06/23/2011   K 4.6 06/23/2011   CL 104 06/23/2011   CO2 26 06/23/2011   BUN 23 06/23/2011   CREATININE 1.48* 06/23/2011   GLUCOSE 106* 06/23/2011   Lab Results  Component Value Date   INR 1.06 05/21/2009    Assessment/Plan: Thoracolumbar scoliosis, lumbago: I discussed the situation extensively with the patient. We have discussed the various treatment options. I described the surgical option of a thoracolumbar decompression if patient fusion. We have discussed the risks, benefits, alternatives and likelihood of achieving our goals. I have answered all the patient's questions. He wants to proceed with surgery   Richards Pherigo D 07/03/2011 7:22 AM

## 2011-07-03 NOTE — Transfer of Care (Signed)
Immediate Anesthesia Transfer of Care Note  Patient: Curtis Frazier  Procedure(s) Performed:  POSTERIOR LUMBAR FUSION 3 LEVEL - Lumbar One-Two/Two-Three and Four-Five Laminectomies with posterior Thoracic Ten-Lumbar Five fusion, posterolateral arthrodesis, and posterior segmental instrumentation  Patient Location: PACU  Anesthesia Type: General  Level of Consciousness: sedated  Airway & Oxygen Therapy: Patient Spontanous Breathing and Patient connected to face mask oxygen  Post-op Assessment: Report given to PACU RN and Post -op Vital signs reviewed and stable  Post vital signs: Reviewed and stable  Complications: No apparent anesthesia complications

## 2011-07-03 NOTE — Preoperative (Signed)
Beta Blockers   Reason not to administer Beta Blockers:Not Applicable 

## 2011-07-03 NOTE — Anesthesia Preprocedure Evaluation (Addendum)
Anesthesia Evaluation  Patient identified by MRN, date of birth, ID band Patient awake    Reviewed: Allergy & Precautions, H&P , NPO status , Patient's Chart, lab work & pertinent test results, reviewed documented beta blocker date and time   Airway Mallampati: I TM Distance: >3 FB Neck ROM: Full    Dental  (+) Teeth Intact   Pulmonary shortness of breath, former smoker clear to auscultation        Cardiovascular hypertension, Pt. 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   Neuro/Psych Negative Psych ROS   GI/Hepatic Neg liver ROS, GERD-  ,  Endo/Other  Diabetes mellitus-, Type 2  Renal/GU      Musculoskeletal   Abdominal   Peds  Hematology negative hematology ROS (+)   Anesthesia Other Findings   Reproductive/Obstetrics                        Anesthesia Physical Anesthesia Plan  ASA: III  Anesthesia Plan: General ETT   Post-op Pain Management:    Induction: Intravenous  Airway Management Planned: Oral ETT  Additional Equipment:   Intra-op Plan:   Post-operative Plan: Extubation in OR  Informed Consent: I have reviewed the patients History and Physical, chart, labs and discussed the procedure including the risks, benefits and alternatives for the proposed anesthesia with the patient or authorized representative who has indicated his/her understanding and acceptance.   Dental advisory given  Plan Discussed with: Anesthesiologist, CRNA and Surgeon  Anesthesia Plan Comments:        Anesthesia Quick Evaluation

## 2011-07-03 NOTE — Progress Notes (Signed)
Subjective:  The patient is somnolent but easily arousable. He appears comfortable.  Objective: Vital signs in last 24 hours: Temp:  [98.2 F (36.8 C)] 98.2 F (36.8 C) (01/21 1730) Pulse Rate:  [55] 55  (01/21 0638) Resp:  [18] 18  (01/21 0638) BP: (127)/(65) 127/65 mmHg (01/21 0638) SpO2:  [96 %-98 %] 98 % (01/21 1730)  Intake/Output from previous day:   Intake/Output this shift: Total I/O In: 5260 [I.V.:3950; Blood:310; IV Piggyback:1000] Out: 1500 [Urine:700; Blood:800]  Physical exam the patient is somnolent but arousable. He is moving all 4 extremities. His thorax has some cutaneous redness from the Wilson frame. His dressing is clean and dry.  Lab Results: No results found for this basename: WBC:2,HGB:2,HCT:2,PLT:2 in the last 72 hours BMET No results found for this basename: NA:2,K:2,CL:2,CO2:2,GLUCOSE:2,BUN:2,CREATININE:2,CALCIUM:2 in the last 72 hours  Studies/Results: Dg Lumbar Spine 1 View  07/03/2011  *RADIOLOGY REPORT*  Clinical Data: Thoracolumbar scoliosis.  LUMBAR SPINE - 1 VIEW  Comparison: MRI dated 08/06/2010  Findings: Radiograph #1 demonstrates an instrument at the level of L3-4.  IMPRESSION: Instrument at L3-4.  Original Report Authenticated By: Gwynn Burly, M.D.    Assessment/Plan: Patient appears to be doing well. I've spoken to his family.  LOS: 0 days     Lary Eckardt D 07/03/2011, 5:43 PM

## 2011-07-03 NOTE — Anesthesia Procedure Notes (Signed)
Procedure Name: Intubation Date/Time: 07/03/2011 7:36 AM Performed by: Glendora Score Pre-anesthesia Checklist: Patient identified, Emergency Drugs available, Suction available and Patient being monitored Patient Re-evaluated:Patient Re-evaluated prior to inductionOxygen Delivery Method: Circle System Utilized Preoxygenation: Pre-oxygenation with 100% oxygen Intubation Type: IV induction Ventilation: Mask ventilation without difficulty and Oral airway inserted - appropriate to patient size Laryngoscope Size: Hyacinth Meeker and 2 Grade View: Grade I Tube type: Oral Tube size: 7.5 mm Number of attempts: 1 Airway Equipment and Method: stylet Placement Confirmation: ETT inserted through vocal cords under direct vision,  positive ETCO2 and breath sounds checked- equal and bilateral Secured at: 23 cm Tube secured with: Tape Dental Injury: Teeth and Oropharynx as per pre-operative assessment

## 2011-07-03 NOTE — Anesthesia Postprocedure Evaluation (Signed)
Anesthesia Post Note  Patient: Curtis Frazier  Procedure(s) Performed:  POSTERIOR LUMBAR FUSION 3 LEVEL - Lumbar One-Two/Two-Three and Four-Five Laminectomies with posterior Thoracic Ten-Lumbar Five fusion, posterolateral arthrodesis, and posterior segmental instrumentation  Anesthesia type: General  Patient location: PACU  Post pain: Pain level controlled  Post assessment: Patient's Cardiovascular Status Stable  Last Vitals:  Filed Vitals:   07/03/11 1848  BP: 151/69  Pulse: 86  Temp: 37.1 C  Resp: 16    Post vital signs: Reviewed and stable  Level of consciousness: alert  Complications: No apparent anesthesia complications

## 2011-07-03 NOTE — Op Note (Signed)
Brief history: The patient is a 71 year old white male who has suffered from chronic back and leg pain. He has failed non-surgical management. She was further worked up with a lumbar MRI and myelo CT. His demonstrated the patient had a thoracolumbar scoliosis with multilevel disc degeneration. I discussed the various treatment options with the patient including surgery. Patient has weighed the risks, benefits, and alternatives surgery decided proceed with a thoracic lumbar decompression instrumentation and fusion.  Preop diagnosis: Thoracolumbar scoliosis, degeneration, spinal stenosis, lumbago  Postop diagnosis: The same  Procedure: Decompressive laminectomy at L12, L2-3, L4-5; posterior segmental agitation T10-L5 with globus titanium pedicle screws and rods; T10-11, and T11-12, T12-L1, L1-2, L2-3, L3-4, L4-5 posterior lateral arthrodesis with local morselized autograft bone, bone morphogenic protein, Actifuse bone graft extender.  Surgeon: Dr. Delma Officer  Assistant: Dr. Shirlean Kelly  Anesthesia: Gen. endotracheal  Estimated blood loss: 800 cc  Complications: None  Drains: None  Specimens: None  Description of procedure: The patient was brought to the operating room by the anesthesia team. General endotracheal anesthesia was induced. Patient was turned to the prone position on the Wilson frame. His thoracic, lumbar and sacral region was then prepared with Betadine scrub and Betadine solution. Sterile drapes were applied. I then injected the area to be incised with Marcaine with epinephrine solution. I then used a scalpel making a linear midline incision from approximately T9-S1. I used electrocautery to perform a bilateral subperiosteal dissection exposing the spinous process lamina from T9-S1. We obtained intraoperative radiograph to confirm our location. We inserted the versa track retractor for exposure.  We began the decompression by incising the interspinous ligament at T12-L1 L1-2  and L2-3 with a scalpel. We used Leksell to remove the spinous process at L2 and the caudal edge of the L1 spinous process. We then used a high-speed drill to perform bilateral L1 and L2 laminotomies. Completed the laminectomy at L2 with a Kerrison punch to widen the laminotomies at L1 with a Kerrison punch and removed the ligamentum flavum at L1-2 and L2-3 decompressing the thecal sac. We then used a high-speed drill perform bilateral L4 laminotomies to widen the laminotomies with a Kerrison punch to remove the L4-5 ligamentum flavum decompressing the L4 and L5 nerve roots. This completed the decompression.  We now turned our attention to the instrumentation. Under AP and lateral fluoroscopic guidance we cannulated the bilateral T10 down to at L5 pedicles with the bone probe. We then tapped the pedicles and removed the tap. We then probed inside the tapped pedicle ball probe to rule out cortical breaches. We then inserted appropriately sized pedicle screws into the bilateral T10-L5 pedicles. In doing this each pedicle at L2 on the right fractured so we removed the pedicle screw. We decided not to reinsert the pedicle screw. We then cut the appropriate length rod and bent to proper contour. We then connected the unilateral pedicle screws with a rod. We secured the rod in place with the caps then compressed the construct and attempt to reduce the patient's scoliosis after performing limited facetectomies at T10-11, T. 1112, T12-L1, L1-2, L2-3, L3-4, and L4-5. We then placed cross connectors between the rods and secured in place. This completed the instrumentation.  We now turned our attention to the arthrodesis. We used a high-speed drill to decorticate the remainder of the T10-11, 1112, T12-L1, L1-2, L2-3, L3-4 and L4-5 facets pars lamina etc. we laid a combination of local morselized autograft bone that we obtained during decompression as well as Actifuse  bone graft extender and bone morphogenic protein-soaked  collagen sponges over these decorticated posterior lateral structures. This completed the posterior lateral arthrodesis.  Then inspected the thecal sac at L1-2 and L2-3 and L4-5-1 and noted they were well decompressed. We then obtained hemostasis using bipolar cautery. We removed the retractor and then reapproximated patient's thoracolumbar fascia with interrupted #1 Vicryl suture. We reapproximated patient's subcutaneous tissue with interrupted 2-0 Vicryl suture. We reapproximated patient's skin with Steri-Strips and benzoin. The was then coated with bacitracin ointment. A sterile dressing was applied. The drapes were removed. Patient was subtotally returned to the supine position where was extubated by the anesthesia team and transported to the post anesthesia care unit in stable condition. All sponge instrument and needle counts were correct at this case.

## 2011-07-03 NOTE — OR Nursing (Signed)
Volunteer desk called with patient update @ 1245

## 2011-07-04 ENCOUNTER — Inpatient Hospital Stay (HOSPITAL_COMMUNITY): Payer: Medicare Other

## 2011-07-04 LAB — GLUCOSE, CAPILLARY
Glucose-Capillary: 170 mg/dL — ABNORMAL HIGH (ref 70–99)
Glucose-Capillary: 182 mg/dL — ABNORMAL HIGH (ref 70–99)
Glucose-Capillary: 207 mg/dL — ABNORMAL HIGH (ref 70–99)

## 2011-07-04 LAB — CBC
Hemoglobin: 9.7 g/dL — ABNORMAL LOW (ref 13.0–17.0)
MCH: 33 pg (ref 26.0–34.0)
RBC: 2.94 MIL/uL — ABNORMAL LOW (ref 4.22–5.81)
WBC: 7.1 10*3/uL (ref 4.0–10.5)

## 2011-07-04 LAB — BLOOD GAS, ARTERIAL
Bicarbonate: 24.2 mEq/L — ABNORMAL HIGH (ref 20.0–24.0)
TCO2: 25.6 mmol/L (ref 0–100)
pCO2 arterial: 48.1 mmHg — ABNORMAL HIGH (ref 35.0–45.0)
pH, Arterial: 7.331 — ABNORMAL LOW (ref 7.350–7.450)

## 2011-07-04 LAB — BASIC METABOLIC PANEL
CO2: 23 mEq/L (ref 19–32)
Calcium: 7.8 mg/dL — ABNORMAL LOW (ref 8.4–10.5)
Chloride: 106 mEq/L (ref 96–112)
Glucose, Bld: 195 mg/dL — ABNORMAL HIGH (ref 70–99)
Potassium: 4.4 mEq/L (ref 3.5–5.1)
Sodium: 137 mEq/L (ref 135–145)

## 2011-07-04 LAB — CARDIAC PANEL(CRET KIN+CKTOT+MB+TROPI)
Relative Index: 0.1 (ref 0.0–2.5)
Troponin I: <0.3 ng/mL (ref <0.30)

## 2011-07-04 LAB — PRO B NATRIURETIC PEPTIDE: Pro B Natriuretic peptide (BNP): 875.7 pg/mL — ABNORMAL HIGH (ref 0–125)

## 2011-07-04 MED ORDER — SODIUM CHLORIDE 0.9 % IV BOLUS (SEPSIS)
500.0000 mL | Freq: Once | INTRAVENOUS | Status: AC
  Administered 2011-07-04: 500 mL via INTRAVENOUS

## 2011-07-04 MED ORDER — DILTIAZEM HCL 100 MG IV SOLR
5.0000 mg/h | INTRAVENOUS | Status: DC
  Administered 2011-07-04: 15 mg/h via INTRAVENOUS
  Filled 2011-07-04: qty 100

## 2011-07-04 MED ORDER — INSULIN ASPART 100 UNIT/ML ~~LOC~~ SOLN
0.0000 [IU] | SUBCUTANEOUS | Status: DC
  Administered 2011-07-04 – 2011-07-05 (×10): 3 [IU] via SUBCUTANEOUS
  Administered 2011-07-06 (×3): 2 [IU] via SUBCUTANEOUS
  Filled 2011-07-04: qty 3

## 2011-07-04 NOTE — Evaluation (Signed)
Occupational Therapy Evaluation Patient Details Name: Curtis Frazier MRN: [id number] DOB: [birth year] Today's Date: 07/04/2011 8:28-9:15  evII Problem List:  Patient Active Problem List  Diagnoses  . DIABETES MELLITUS, TYPE II  . HYPERLIPIDEMIA  . HYPERKALEMIA  . ANEMIA-UNSPECIFIED  . ESSENTIAL HYPERTENSION, BENIGN  . C A D  . GERD  . ANGIODYSPLASIA-INTESTINE  . PSORIATIC ARTHRITIS  . PSORIASIS  . ACTINIC KERATOSIS  . ARTHRITIS, RHEUMATOID  . DEGENERATIVE DISC DISEASE  . SCOLIOSIS  . MURMUR  . CAROTID BRUIT  . HX, PERSONAL, COLONIC POLYPS  . TOBACCO ABUSE, HX OF    Past Medical History:  Past Medical History  Diagnosis Date  . Other and unspecified hyperlipidemia   . Coronary atherosclerosis of unspecified type of vessel, native or graft   . Personal history of colonic polyps   . Actinic keratosis   . Psoriatic arthropathy   . Other psoriasis   . Scoliosis (and kyphoscoliosis), idiopathic   . GI bleed 12/10    cecam AVM  . Gastritis 12/10  . Hyperlipidemia     takes Pravastatin daily  . Hypertension     takes Metoprolol daily  . Myocardial infarction 2010  . Shortness of breath     with exertion  . Pneumonia     hx of-2008  . Headache     occasionally  . Joint pain   . Joint swelling   . Rheumatoid arthritis     takes Methotrexate 7pills weekly  . Degeneration of intervertebral disc, site unspecified   . Chronic back pain     scoliosis/stenosis/radiculopathy,degenerative disc disease  . Psoriasis   . Bruises easily   . Esophageal reflux     takes Omeprazole daily  . History of colonic polyps   . Hemorrhoids   . Kidney stone     hx of  . Blood transfusion 2010  . Type II or unspecified type diabetes mellitus without mention of complication, not stated as uncontrolled     type II;controlled by diet and exercise   Past Surgical History:  Past Surgical History  Procedure Date  . Rotator cuff repair 2000    left   . Colonoscopy   . Vasectomy 1979  .  Ptca stent 02/2009    has one stent  . Carotid doppler 10/12    0-39% R and 60-79% left   . Cataract surgery 2012  . Cardiac catheterization 2010  . Lithotripsy 2009    OT Assessment/Plan/Recommendation OT Assessment Clinical Impression Statement: Pleasant 71 yr old male admitted for thoracic/lumbar fusion T10-L5.  Pt currently with increased need for assistance with selfcare tasks at a mod assist level overall.  Will need acute OT services to help pt reach a supervision level fro D/C home with spouse and family assisting PRN.   OT Recommendation/Assessment: Patient will need skilled OT in the acute care venue OT Problem List: Decreased strength;Decreased activity tolerance;Impaired balance (sitting and/or standing);Pain;Decreased knowledge of use of DME or AE;Decreased safety awareness Barriers to Discharge: None OT Therapy Diagnosis : Generalized weakness;Acute pain OT Plan OT Frequency: Min 2X/week OT Treatment/Interventions: Self-care/ADL training;Therapeutic activities;DME and/or AE instruction;Balance training;Patient/family education OT Recommendation Follow Up Recommendations: No OT follow up Equipment Recommended: 3 in 1 bedside comode;Rolling walker with 5" wheels Individuals Consulted Consulted and Agree with Results and Recommendations: Patient;Family member/caregiver Family Member Consulted: Daughter OT Goals Acute Rehab OT Goals OT Goal Formulation: With patient/family Time For Goal Achievement: 7 days ADL Goals Pt Will Perform Grooming: with supervision;Standing at sink;Other (  comment) (2 grooming tasks.) ADL Goal: Grooming - Progress: Goal set today Pt Will Perform Lower Body Dressing: with supervision;with adaptive equipment;Sit to stand from chair ADL Goal: Lower Body Dressing - Progress: Goal set today Pt Will Transfer to Toilet: with supervision;with DME;3-in-1;Maintaining back safety precautions ADL Goal: Toilet Transfer - Progress: Goal set today Pt Will  Perform Toileting - Clothing Manipulation: with supervision;Sitting on 3-in-1 or toilet;Standing (Simulated sit to stand from EOB.) ADL Goal: Toileting - Clothing Manipulation - Progress: Goal set today Pt Will Perform Toileting - Hygiene: with supervision;Sit to stand from 3-in-1/toilet;Other (comment) (Simulated sit to stand from EOB.) ADL Goal: Toileting - Hygiene - Progress: Goal set today Pt Will Perform Tub/Shower Transfer: Ambulation;with DME (3:1 in shower) ADL Goal: Tub/Shower Transfer - Progress: Goal set today Miscellaneous OT Goals Miscellaneous OT Goal #1: Pt will state 3/3 back precautions independently. OT Goal: Miscellaneous Goal #1 - Progress: Goal set today  OT Evaluation Precautions/Restrictions  Precautions Precautions: Back Required Braces or Orthoses: Yes Spinal Brace: Lumbar corset;Applied in supine position (No orders fro donning EOB at this time.) Restrictions Weight Bearing Restrictions: No Prior Functioning Home Living Lives With: Spouse Receives Help From: Family Type of Home: House Home Layout: One level Home Access: Stairs to enter Entrance Stairs-Rails: None Entrance Stairs-Number of Steps: 1 step into the garage Bathroom Shower/Tub: Walk-in shower;Door Teacher, early years/pre: Yes Prior Function Level of Independence: Independent with basic ADLs Driving: Yes Vocation: Full time employment ADL   Vision/Perception  Vision - History Baseline Vision: Wears glasses all the time Patient Visual Report: No change from baseline Vision - Assessment Eye Alignment: Within Functional Limits Vision Assessment: Vision not tested Perception Perception: Within Functional Limits Praxis Praxis: Intact Cognition Cognition Arousal/Alertness: Awake/alert Overall Cognitive Status: Appears within functional limits for tasks assessed Orientation Level: Oriented X4 Sensation/Coordination Sensation Light Touch: Appears  Intact Stereognosis: Not tested Hot/Cold: Not tested Proprioception: Not tested Extremity Assessment RUE Assessment RUE Assessment: Within Functional Limits LUE Assessment LUE Assessment: Within Functional Limits Mobility  Bed Mobility Bed Mobility: Yes Rolling Left: 3: Mod assist;With rail Rolling Left Details (indicate cue type and reason): Pt required mod instructional cues for technique. Left Sidelying to Sit: 3: Mod assist;HOB elevated (comment degrees) Left Sidelying to Sit Details (indicate cue type and reason): HOB elevated approximately 20 degrees. Transfers Transfers: Yes Sit to Stand: 3: Mod assist;From bed;Without upper extremity assist Sit to Stand Details (indicate cue type and reason): Pt pushed up from the bed with the LUE and placed the right hand on the walker. Exercises   End of Session OT - End of Session Equipment Utilized During Treatment: Gait belt;Other (comment) (rolling walker) Activity Tolerance: Patient limited by pain Patient left: in chair;with call bell in reach;with family/visitor present Nurse Communication: Mobility status for transfers General Behavior During Session: [facility] for tasks performed Cognition: [facility] for tasks performed   Abby Stines OTR/L 07/04/2011, 9:27 AM  Pager number [phone]

## 2011-07-04 NOTE — Evaluation (Signed)
Physical Therapy Evaluation Patient Details Name: Curtis Frazier MRN: [id number] DOB: [birth year] Today's Date: 07/04/2011  Problem List:  Patient Active Problem List  Diagnoses  . DIABETES MELLITUS, TYPE II  . HYPERLIPIDEMIA  . HYPERKALEMIA  . ANEMIA-UNSPECIFIED  . ESSENTIAL HYPERTENSION, BENIGN  . C A D  . GERD  . ANGIODYSPLASIA-INTESTINE  . PSORIATIC ARTHRITIS  . PSORIASIS  . ACTINIC KERATOSIS  . ARTHRITIS, RHEUMATOID  . DEGENERATIVE DISC DISEASE  . SCOLIOSIS  . MURMUR  . CAROTID BRUIT  . HX, PERSONAL, COLONIC POLYPS  . TOBACCO ABUSE, HX OF    Past Medical History:  Past Medical History  Diagnosis Date  . Other and unspecified hyperlipidemia   . Coronary atherosclerosis of unspecified type of vessel, native or graft   . Personal history of colonic polyps   . Actinic keratosis   . Psoriatic arthropathy   . Other psoriasis   . Scoliosis (and kyphoscoliosis), idiopathic   . GI bleed 12/10    cecam AVM  . Gastritis 12/10  . Hyperlipidemia     takes Pravastatin daily  . Hypertension     takes Metoprolol daily  . Myocardial infarction 2010  . Shortness of breath     with exertion  . Pneumonia     hx of-2008  . Headache     occasionally  . Joint pain   . Joint swelling   . Rheumatoid arthritis     takes Methotrexate 7pills weekly  . Degeneration of intervertebral disc, site unspecified   . Chronic back pain     scoliosis/stenosis/radiculopathy,degenerative disc disease  . Psoriasis   . Bruises easily   . Esophageal reflux     takes Omeprazole daily  . History of colonic polyps   . Hemorrhoids   . Kidney stone     hx of  . Blood transfusion 2010  . Type II or unspecified type diabetes mellitus without mention of complication, not stated as uncontrolled     type II;controlled by diet and exercise   Past Surgical History:  Past Surgical History  Procedure Date  . Rotator cuff repair 2000    left   . Colonoscopy   . Vasectomy 1979  . Ptca stent 02/2009     has one stent  . Carotid doppler 10/12    0-39% R and 60-79% left   . Cataract surgery 2012  . Cardiac catheterization 2010  . Lithotripsy 2009    PT Assessment/Plan/Recommendation PT Assessment Clinical Impression Statement: Pleasant 71 yr old male admitted for thoracic/lumbar fusion T10-L5. Pt currently requires moderate assistance overall for mobility. Pt likely to progress quickly however will need acute PT to address below deficits.  PT Recommendation/Assessment: Patient will need skilled PT in the acute care venue PT Problem List: Decreased strength;Decreased activity tolerance;Decreased mobility;Decreased knowledge of use of DME;Pain Barriers to Discharge: None PT Therapy Diagnosis : Difficulty walking;Generalized weakness PT Plan PT Frequency: Min 6X/week PT Treatment/Interventions: DME instruction;Gait training;Stair training;Functional mobility training;Therapeutic activities;Therapeutic exercise;Patient/family education PT Recommendation Follow Up Recommendations: Home health PT Equipment Recommended: 3 in 1 bedside comode;Rolling walker with 5" wheels PT Goals  Acute Rehab PT Goals PT Goal Formulation: With patient Time For Goal Achievement: 2 weeks Pt will Roll Supine to Right Side: with modified independence PT Goal: Rolling Supine to Right Side - Progress: Not met Pt will Roll Supine to Left Side: with modified independence PT Goal: Rolling Supine to Left Side - Progress: Not met Pt will go Supine/Side to Sit: with modified independence  PT Goal: Supine/Side to Sit - Progress: Not met Pt will go Sit to Supine/Side: with supervision PT Goal: Sit to Supine/Side - Progress: Not met Pt will go Sit to Stand: with supervision PT Goal: Sit to Stand - Progress: Not met Pt will go Stand to Sit: with supervision PT Goal: Stand to Sit - Progress: Not met Pt will Ambulate: 51 - 150 feet;with supervision;with least restrictive assistive device PT Goal: Ambulate - Progress:  Not met Pt will Go Up / Down Stairs: 1-2 stairs;with supervision;with least restrictive assistive device PT Goal: Up/Down Stairs - Progress: Not met Additional Goals Additional Goal #1: Verbalize 3/3 back precautions without verbal cues.  PT Goal: Additional Goal #1 - Progress: Not met  PT Evaluation Precautions/Restrictions  Precautions Precautions: Back Required Braces or Orthoses: Yes Spinal Brace: Lumbar corset;Applied in supine position (No orders fro donning EOB at this time.) Restrictions Weight Bearing Restrictions: No Prior Functioning  Home Living Lives With: Spouse Receives Help From: Family Type of Home: House Home Layout: One level Home Access: Stairs to enter Entrance Stairs-Rails: None Entrance Stairs-Number of Steps: 1 step into the garage Bathroom Shower/Tub: Walk-in shower;Door Teacher, early years/pre: Yes How Accessible:  (to be determined) Home Adaptive Equipment: None Prior Function Level of Independence: Independent with basic ADLs Driving: Yes Vocation: Full time employment Comments: Pt is a truck Administrator, [employer] Arousal/Alertness: Awake/alert Overall Cognitive Status: Appears within functional limits for tasks assessed Orientation Level: Oriented X4 Sensation/Coordination Sensation Light Touch: Appears Intact (Bil. LEs) Stereognosis: Not tested Hot/Cold: Not tested Proprioception: Not tested Coordination Gross Motor Movements are Fluid and Coordinated: Yes Extremity Assessment RUE Assessment RUE Assessment: Within Functional Limits LUE Assessment LUE Assessment: Within Functional Limits RLE Assessment RLE Assessment: Within Functional Limits LLE Assessment LLE Assessment: Within Functional Limits Mobility (including Balance) Bed Mobility Bed Mobility: Yes Rolling Left: 3: Mod assist;With rail Rolling Left Details (indicate cue type and reason): Verbal/tactile cues for sequencing, flexion of bil LEs  and to maintain log roll.  Left Sidelying to Sit: 3: Mod assist;HOB elevated (comment degrees) Left Sidelying to Sit Details (indicate cue type and reason): Verbal/tactile cues for sequencing, assist to upright trunk.  Transfers Transfers: Yes Sit to Stand: 3: Mod assist;From bed;Without upper extremity assist Sit to Stand Details (indicate cue type and reason): Pt pushed up from the bed with the LUE and placed the right hand on the walker as pt had Rt. UE blocked from extension for IV.  Stand Pivot Transfers: 1: +2 Total assist;Other (comment) (Pt = 85%) Stand Pivot Transfer Details (indicate cue type and reason): Verbal cues for sequence, assist for negotiation of RW. Verbal cues to square up to chair prior to sitting. Tactile cues for reaching for armrests, flexion of knees to maintain precautions.  Ambulation/Gait Ambulation/Gait: No    Exercise  General Exercises - Lower Extremity Ankle Circles/Pumps: AROM;Both;10 reps Long Arc Quad: AROM;Both;10 reps End of Session PT - End of Session Equipment Utilized During Treatment: Back brace Activity Tolerance: Patient limited by fatigue;Patient tolerated treatment well Patient left: in bed;with call bell in reach Nurse Communication: Mobility status for transfers General Behavior During Session: [facility] for tasks performed Cognition: [facility] for tasks performed  Sherie Don 07/04/2011, 10:25 AM Sherie Don) Carleene Mains PT, DPT Acute Rehabilitation [phone]

## 2011-07-04 NOTE — Progress Notes (Signed)
eLink Physician-Brief Progress Note Patient Name: Curtis Frazier DOB: Nov 30, 1940 MRN: [id number]  Date of Service  07/04/2011   HPI/Events of Note  Patient with onset of AF with RVR on dilt gtt.  Currently with O2 saturations of mid to high 80s on 6 liters Mar-Mac.  Patient does not appear to be in resp distress lying in bed.   Cardiology is currently seeing patient related to AF.   eICU Interventions  Plan: Cont dilt gtt - plans per cards - follow up labs ABG RT to adjust O2 to obtain sats greater or equal to 92%   Intervention Category Intermediate Interventions: Respiratory distress - evaluation and management;Arrhythmia - evaluation and management  DETERDING,ELIZABETH 07/04/2011, 11:26 PM

## 2011-07-04 NOTE — Progress Notes (Addendum)
eLink Physician-Brief Progress Note Patient Name: TAVIN VERNET DOB: [birth year] MRN: [id number]  Date of Service  07/04/2011   HPI/Events of Note   RN calling elink on behalf of Dr Venetia Maxon who is in OR.   PAtient is d1 post op from t-l decompression  RN says last 20 min sudden fever and HR 160s  On camera exam  Comofrtable appearing non intubated patient  HR 173 - A FIB RVR ON MONITOR BP 132-/69 Pulse ox 91%  Anti-infectives     Start     Dose/Rate Route Frequency Ordered Stop   07/03/11 2200   vancomycin (VANCOCIN) IVPB 1000 mg/200 mL premix        1,000 mg 200 mL/hr over 60 Minutes Intravenous Every 12 hours 07/03/11 1714 07/03/11 2322   07/03/11 1214   bacitracin 40981 UNITS injection     Comments: DAYE, DEVONIA: cabinet override         07/03/11 1214 07/04/11 0029   07/03/11 0752   bacitracin 19147 UNITS injection     Comments: DAYE, DEVONIA: cabinet override         07/03/11 0752 07/03/11 1959   07/03/11 0720   bacitracin 50,000 Units in sodium chloride irrigation 0.9 % 500 mL irrigation  Status:  Discontinued          As needed 07/03/11 0905 07/03/11 1726   07/03/11 0716   bacitracin 82956 UNITS injection     Comments: RATCLIFF, ESTHER: cabinet override         07/03/11 0716 07/03/11 1929   07/03/11 0000   vancomycin (VANCOCIN) IVPB 1000 mg/200 mL premix  Status:  Discontinued        1,000 mg 200 mL/hr over 60 Minutes Intravenous 120 min pre-op 07/02/11 1249 07/03/11 1859         \   eICU Interventions  PLAN Fluid bolus Echo ordered Start cardizem gtt Stat ekg Stat labs including sepsis biomarkers and cultures and abg and cxr Monitor Cardiology consult called Likely needs PCCM consult    Intervention Category Major Interventions: Arrhythmia - evaluation and management  Dailyn Kempner 07/04/2011, 10:25 PM

## 2011-07-04 NOTE — Progress Notes (Signed)
Patient ID: Curtis Frazier, male   DOB: May 17, 1941, 71 y.o.   MRN: [id number] Subjective:  The patient is alert and pleasant. He looks well. He is appropriately sore.  Objective: Vital signs in last 24 hours: Temp:  [98.2 F (36.8 C)-99.7 F (37.6 C)] 99.6 F (37.6 C) (01/22 0400) Pulse Rate:  [72-132] 106  (01/22 0700) Resp:  [9-23] 22  (01/22 0700) BP: (100-161)/(45-92) 109/58 mmHg (01/22 0700) SpO2:  [89 %-98 %] 94 % (01/22 0700) Arterial Line BP: (101-201)/(43-69) 115/43 mmHg (01/22 0700) Weight:  [86.2 kg (190 lb 0.6 oz)] 86.2 kg (190 lb 0.6 oz) (01/21 2100)  Intake/Output from previous day: 01/21 0701 - 01/22 0700 In: 6448.5 [I.V.:4938.5; Blood:310; IV Piggyback:1200] Out: 3025 [Urine:2225; Blood:800] Intake/Output this shift:    Physical exam the patient is alert and oriented. His lower extremity motor strength is normal.  Lab Results:  Basename 07/04/11 0400  WBC 7.1  HGB 9.7*  HCT 28.1*  PLT 166   BMET  Basename 07/04/11 0400  NA 137  K 4.4  CL 106  CO2 23  GLUCOSE 195*  BUN 22  CREATININE 1.30  CALCIUM 7.8*    Studies/Results: Dg Lumbar Spine 1 View  07/03/2011  *RADIOLOGY REPORT*  Clinical Data: Thoracolumbar scoliosis.  LUMBAR SPINE - 1 VIEW  Comparison: MRI dated 08/06/2010  Findings: Radiograph #1 demonstrates an instrument at the level of L3-4.  IMPRESSION: Instrument at L3-4.  Original Report Authenticated By: Gwynn Burly, M.D.   Dg C-arm Gt 120 Min-no Report  07/03/2011  CLINICAL DATA: T10-L5 Fusion   C-ARM GT 120 MINUTE  Fluoroscopy was utilized by the requesting physician.  No radiographic  interpretation.      Assessment/Plan: Postop day #1: The patient is doing well. We will discontinue his Foley and mobilize him with PT/OT.  LOS: 1 day     Keane Martelli D 07/04/2011, 7:36 AM

## 2011-07-04 NOTE — Plan of Care (Signed)
Problem: Phase I Progression Outcomes Goal: OOB as tolerated unless otherwise ordered Outcome: Progressing Pt transferred to bedside chair.  Tolerating sitting out of bed currently. Perrin Maltese, OTR/L Pager number [phone] 07/04/2011

## 2011-07-05 ENCOUNTER — Encounter (HOSPITAL_COMMUNITY): Payer: Self-pay | Admitting: Cardiovascular Disease

## 2011-07-05 ENCOUNTER — Inpatient Hospital Stay (HOSPITAL_COMMUNITY): Payer: Medicare Other

## 2011-07-05 DIAGNOSIS — J9600 Acute respiratory failure, unspecified whether with hypoxia or hypercapnia: Secondary | ICD-10-CM

## 2011-07-05 DIAGNOSIS — R7989 Other specified abnormal findings of blood chemistry: Secondary | ICD-10-CM

## 2011-07-05 DIAGNOSIS — I4891 Unspecified atrial fibrillation: Secondary | ICD-10-CM

## 2011-07-05 DIAGNOSIS — I359 Nonrheumatic aortic valve disorder, unspecified: Secondary | ICD-10-CM

## 2011-07-05 DIAGNOSIS — R0602 Shortness of breath: Secondary | ICD-10-CM

## 2011-07-05 DIAGNOSIS — J810 Acute pulmonary edema: Secondary | ICD-10-CM

## 2011-07-05 LAB — DIFFERENTIAL
Lymphocytes Relative: 18 % (ref 12–46)
Monocytes Absolute: 1.6 10*3/uL — ABNORMAL HIGH (ref 0.1–1.0)
Monocytes Relative: 12 % (ref 3–12)
Neutro Abs: 9 10*3/uL — ABNORMAL HIGH (ref 1.7–7.7)

## 2011-07-05 LAB — CBC
HCT: 30.2 % — ABNORMAL LOW (ref 39.0–52.0)
Hemoglobin: 10 g/dL — ABNORMAL LOW (ref 13.0–17.0)
MCH: 32.4 pg (ref 26.0–34.0)
MCHC: 33.7 g/dL (ref 30.0–36.0)
MCV: 96.2 fL (ref 78.0–100.0)
Platelets: 142 10*3/uL — ABNORMAL LOW (ref 150–400)
RBC: 2.93 MIL/uL — ABNORMAL LOW (ref 4.22–5.81)
RBC: 3.05 MIL/uL — ABNORMAL LOW (ref 4.22–5.81)
RDW: 13.7 % (ref 11.5–15.5)
WBC: 12.9 10*3/uL — ABNORMAL HIGH (ref 4.0–10.5)

## 2011-07-05 LAB — COMPREHENSIVE METABOLIC PANEL
AST: 85 U/L — ABNORMAL HIGH (ref 0–37)
Albumin: 2.3 g/dL — ABNORMAL LOW (ref 3.5–5.2)
Alkaline Phosphatase: 59 U/L (ref 39–117)
CO2: 21 mEq/L (ref 19–32)
Chloride: 99 mEq/L (ref 96–112)
Creatinine, Ser: 1.85 mg/dL — ABNORMAL HIGH (ref 0.50–1.35)
GFR calc non Af Amer: 35 mL/min — ABNORMAL LOW (ref >90)
Potassium: 4.3 mEq/L (ref 3.5–5.1)
Total Bilirubin: 0.4 mg/dL (ref 0.3–1.2)

## 2011-07-05 LAB — LACTIC ACID, PLASMA: Lactic Acid, Venous: 5.1 mmol/L — ABNORMAL HIGH (ref 0.5–2.2)

## 2011-07-05 LAB — GLUCOSE, CAPILLARY
Glucose-Capillary: 158 mg/dL — ABNORMAL HIGH (ref 70–99)
Glucose-Capillary: 198 mg/dL — ABNORMAL HIGH (ref 70–99)

## 2011-07-05 LAB — BASIC METABOLIC PANEL
Chloride: 101 mEq/L (ref 96–112)
GFR calc non Af Amer: 34 mL/min — ABNORMAL LOW (ref >90)
Glucose, Bld: 193 mg/dL — ABNORMAL HIGH (ref 70–99)
Potassium: 5.1 mEq/L (ref 3.5–5.1)
Sodium: 134 mEq/L — ABNORMAL LOW (ref 135–145)

## 2011-07-05 LAB — CARDIAC PANEL(CRET KIN+CKTOT+MB+TROPI)
CK, MB: 5.1 ng/mL — ABNORMAL HIGH (ref 0.3–4.0)
Total CK: 3738 U/L — ABNORMAL HIGH (ref 7–232)
Troponin I: 0.53 ng/mL (ref <0.30)

## 2011-07-05 LAB — URINE CULTURE: Culture  Setup Time: 201301230555

## 2011-07-05 LAB — PHOSPHORUS: Phosphorus: 3.3 mg/dL (ref 2.3–4.6)

## 2011-07-05 LAB — PROCALCITONIN: Procalcitonin: 0.66 ng/mL

## 2011-07-05 MED ORDER — DILTIAZEM LOAD VIA INFUSION
20.0000 mg | Freq: Once | INTRAVENOUS | Status: AC
  Administered 2011-07-05: 20 mg via INTRAVENOUS
  Filled 2011-07-05: qty 20

## 2011-07-05 MED ORDER — FUROSEMIDE 10 MG/ML IJ SOLN
40.0000 mg | Freq: Once | INTRAMUSCULAR | Status: AC
  Administered 2011-07-05: 40 mg via INTRAVENOUS
  Filled 2011-07-05: qty 4

## 2011-07-05 MED ORDER — DILTIAZEM HCL 25 MG/5ML IV SOLN: 20.0000 mg | Freq: Once | INTRAVENOUS | Status: DC

## 2011-07-05 MED ORDER — AMIODARONE HCL IN DEXTROSE 360-4.14 MG/200ML-% IV SOLN
1.0000 mg/min | INTRAVENOUS | Status: DC
  Administered 2011-07-05 (×2): 1 mg/min via INTRAVENOUS
  Filled 2011-07-05 (×2): qty 200

## 2011-07-05 MED ORDER — AMIODARONE LOAD VIA INFUSION
150.0000 mg | Freq: Once | INTRAVENOUS | Status: AC
  Administered 2011-07-05: 150 mg via INTRAVENOUS
  Filled 2011-07-05 (×2): qty 83.34

## 2011-07-05 MED ORDER — IOHEXOL 350 MG/ML SOLN
80.0000 mL | Freq: Once | INTRAVENOUS | Status: AC | PRN
  Administered 2011-07-05: 80 mL via INTRAVENOUS

## 2011-07-05 MED ORDER — SODIUM CHLORIDE 0.9 % IV SOLN: INTRAVENOUS | Status: DC

## 2011-07-05 MED ORDER — DIGOXIN 0.25 MG/ML IJ SOLN
0.1250 mg | Freq: Once | INTRAMUSCULAR | Status: AC
  Administered 2011-07-05: 0.125 mg via INTRAVENOUS
  Filled 2011-07-05: qty 0.5

## 2011-07-05 MED ORDER — LACTATED RINGERS IV SOLN
INTRAVENOUS | Status: DC
  Administered 2011-07-05 – 2011-07-08 (×2): via INTRAVENOUS

## 2011-07-05 MED ORDER — SODIUM CHLORIDE 0.9 % IV BOLUS (SEPSIS)
750.0000 mL | Freq: Once | INTRAVENOUS | Status: AC
  Administered 2011-07-05: 750 mL via INTRAVENOUS

## 2011-07-05 MED ORDER — METOPROLOL TARTRATE 25 MG PO TABS
25.0000 mg | ORAL_TABLET | Freq: Two times a day (BID) | ORAL | Status: DC
  Administered 2011-07-05: 25 mg via ORAL
  Filled 2011-07-05 (×3): qty 1

## 2011-07-05 MED ORDER — ESMOLOL HCL-SODIUM CHLORIDE 2000 MG/100ML IV SOLN
25.0000 ug/kg/min | INTRAVENOUS | Status: DC
  Administered 2011-07-05: 25 ug/kg/min via INTRAVENOUS
  Filled 2011-07-05: qty 100

## 2011-07-05 MED ORDER — PANTOPRAZOLE SODIUM 40 MG PO TBEC
40.0000 mg | DELAYED_RELEASE_TABLET | Freq: Every day | ORAL | Status: DC
  Administered 2011-07-05 – 2011-07-11 (×7): 40 mg via ORAL
  Filled 2011-07-05 (×7): qty 1

## 2011-07-05 MED ORDER — SODIUM CHLORIDE 0.9 % IV SOLN
Freq: Once | INTRAVENOUS | Status: AC
  Administered 2011-07-05: via INTRAVENOUS

## 2011-07-05 MED ORDER — AMIODARONE HCL IN DEXTROSE 360-4.14 MG/200ML-% IV SOLN
0.5000 mg/min | INTRAVENOUS | Status: DC
  Filled 2011-07-05: qty 200

## 2011-07-05 NOTE — Consult Note (Addendum)
Reason for Consult :New onset AF with RVR Referring Physician: Dr Viviana Simpler is an 71 y.o. male with CAD s/p remote PCI, HTN, Hyperlipidemia and DM who just had a thoracolumbar decompression surgery HPI: I was called to see patient with above medical problems who went into AF with RVR about an hour prior to being called. His HR on the monitor was in the 160s and irregular.EKG confirmed he was in AF. He denied any chest pain or discomfort. He denied being short of breath. He was already on a cardizem drip at 15mg /hr with no effect. His BP was 85/50, O2 sat was 85% on 4L of oxygen. His O2 sat had been in the low to mid 90s earlier today and his SBP was in the 120 range also. Of note he was febrile. We gave a bolus of cardizem 20 mg which slowed his HR down to the low 100s to 120s. His SBP dipped to 71 and we gave a 500cc bolus of normal saline. He received tylenol for his fever and cardizem was continued at 15mg  /hr. Within 30 minutes of all these measures, his heart rate settled to low 100s and SBP rose to , Oxygen sat went up to 97%. His cardiac history is significant for an MI in 2009 and he had PCI to LCX at [facility]. He had a normal nuclear stress test in November 2011 as part of pre-op evaluation for spinal surgery.   Past Medical History  Diagnosis Date  . Other and unspecified hyperlipidemia   . Coronary atherosclerosis of unspecified type of vessel, native or graft   . Personal history of colonic polyps   . Actinic keratosis   . Psoriatic arthropathy   . Other psoriasis   . Scoliosis (and kyphoscoliosis), idiopathic   . GI bleed 12/10    cecam AVM  . Gastritis 12/10  . Hyperlipidemia     takes Pravastatin daily  . Hypertension     takes Metoprolol daily  . Myocardial infarction 2010  . Shortness of breath     with exertion  . Pneumonia     hx of-2008  . Headache     occasionally  . Joint pain   . Joint swelling   . Rheumatoid arthritis     takes Methotrexate  7pills weekly  . Degeneration of intervertebral disc, site unspecified   . Chronic back pain     scoliosis/stenosis/radiculopathy,degenerative disc disease  . Psoriasis   . Bruises easily   . Esophageal reflux     takes Omeprazole daily  . History of colonic polyps   . Hemorrhoids   . Kidney stone     hx of  . Blood transfusion 2010  . Type II or unspecified type diabetes mellitus without mention of complication, not stated as uncontrolled     type II;controlled by diet and exercise    Past Surgical History  Procedure Date  . Rotator cuff repair 2000    left   . Colonoscopy   . Vasectomy 1979  . Ptca stent 02/2009    has one stent  . Carotid doppler 10/12    0-39% R and 60-79% left   . Cataract surgery 2012  . Cardiac catheterization 2010  . Lithotripsy 2009    Family History  Problem Relation Age of Onset  . Coronary artery disease Mother   . Diabetes Mother   . Heart disease Mother     CAD  . Coronary artery disease Sister   . Hypertension  Sister   . Kidney failure Sister   . Heart disease Sister     CAD  . Pancreatic cancer Brother   . Diabetes Brother   . Prostate cancer Brother   . Cancer Brother     pancreatic CA  . Diabetes Brother   . Anesthesia problems Neg Hx   . Hypotension Neg Hx   . Malignant hyperthermia Neg Hx   . Pseudochol deficiency Neg Hx     Social History:  reports that he has quit smoking. He does not have any smokeless tobacco history on file. He reports that he does not drink alcohol or use illicit drugs.  Allergies:  Allergies  Allergen Reactions  . Penicillins     REACTION: rash, swelling  . Tetracycline     REACTION: rash, swelling    Medications: I have reviewed the patient's current medications.  Results for orders placed during the hospital encounter of 07/03/11 (from the past 48 hour(s))  GLUCOSE, CAPILLARY     Status: Abnormal   Collection Time   07/03/11  6:20 AM      Component Value Range Comment    Glucose-Capillary 137 (*) 70 - 99 (mg/dL)   GLUCOSE, CAPILLARY     Status: Abnormal   Collection Time   07/03/11  5:35 PM      Component Value Range Comment   Glucose-Capillary 178 (*) 70 - 99 (mg/dL)    Comment 1 Documented in Chart      Comment 2 Notify RN     GLUCOSE, CAPILLARY     Status: Abnormal   Collection Time   07/03/11  7:52 PM      Component Value Range Comment   Glucose-Capillary 210 (*) 70 - 99 (mg/dL)    Comment 1 Notify RN      Comment 2 Documented in Chart     CBC     Status: Abnormal   Collection Time   07/04/11 12:00 AM      Component Value Range Comment   WBC 11.2 (*) 4.0 - 10.5 (K/uL)    RBC 2.93 (*) 4.22 - 5.81 (MIL/uL)    Hemoglobin 9.5 (*) 13.0 - 17.0 (g/dL)    HCT 16.1 (*) 09.6 - 52.0 (%)    MCV 96.2  78.0 - 100.0 (fL)    MCH 32.4  26.0 - 34.0 (pg)    MCHC 33.7  30.0 - 36.0 (g/dL)    RDW 04.5  40.9 - 81.1 (%)    Platelets 142 (*) 150 - 400 (K/uL)   GLUCOSE, CAPILLARY     Status: Abnormal   Collection Time   07/04/11 12:27 AM      Component Value Range Comment   Glucose-Capillary 207 (*) 70 - 99 (mg/dL)   CBC     Status: Abnormal   Collection Time   07/04/11  4:00 AM      Component Value Range Comment   WBC 7.1  4.0 - 10.5 (K/uL)    RBC 2.94 (*) 4.22 - 5.81 (MIL/uL)    Hemoglobin 9.7 (*) 13.0 - 17.0 (g/dL)    HCT 91.4 (*) 78.2 - 52.0 (%)    MCV 95.6  78.0 - 100.0 (fL)    MCH 33.0  26.0 - 34.0 (pg)    MCHC 34.5  30.0 - 36.0 (g/dL)    RDW 95.6  21.3 - 08.6 (%)    Platelets 166  150 - 400 (K/uL)   BASIC METABOLIC PANEL     Status: Abnormal  Collection Time   07/04/11  4:00 AM      Component Value Range Comment   Sodium 137  135 - 145 (mEq/L)    Potassium 4.4  3.5 - 5.1 (mEq/L)    Chloride 106  96 - 112 (mEq/L)    CO2 23  19 - 32 (mEq/L)    Glucose, Bld 195 (*) 70 - 99 (mg/dL)    BUN 22  6 - 23 (mg/dL)    Creatinine, Ser 5.62  0.50 - 1.35 (mg/dL)    Calcium 7.8 (*) 8.4 - 10.5 (mg/dL)    GFR calc non Af Amer 54 (*) >90 (mL/min)    GFR calc Af  Amer 62 (*) >90 (mL/min)   GLUCOSE, CAPILLARY     Status: Abnormal   Collection Time   07/04/11  4:00 AM      Component Value Range Comment   Glucose-Capillary 182 (*) 70 - 99 (mg/dL)   GLUCOSE, CAPILLARY     Status: Abnormal   Collection Time   07/04/11  8:00 AM      Component Value Range Comment   Glucose-Capillary 170 (*) 70 - 99 (mg/dL)   GLUCOSE, CAPILLARY     Status: Abnormal   Collection Time   07/04/11 12:02 PM      Component Value Range Comment   Glucose-Capillary 200 (*) 70 - 99 (mg/dL)   GLUCOSE, CAPILLARY     Status: Abnormal   Collection Time   07/04/11  3:43 PM      Component Value Range Comment   Glucose-Capillary 182 (*) 70 - 99 (mg/dL)    Comment 1 Notify RN      Comment 2 Documented in Chart     GLUCOSE, CAPILLARY     Status: Abnormal   Collection Time   07/04/11  7:43 PM      Component Value Range Comment   Glucose-Capillary 185 (*) 70 - 99 (mg/dL)    Comment 1 Notify RN      Comment 2 Documented in Chart     CARDIAC PANEL(CRET KIN+CKTOT+MB+TROPI)     Status: Abnormal   Collection Time   07/04/11 10:39 PM      Component Value Range Comment   Total CK 4245 (*) 7 - 232 (U/L)    CK, MB 5.4 (*) 0.3 - 4.0 (ng/mL)    Troponin I <0.30  <0.30 (ng/mL)    Relative Index 0.1  0.0 - 2.5    PRO B NATRIURETIC PEPTIDE     Status: Abnormal   Collection Time   07/04/11 10:39 PM      Component Value Range Comment   Pro B Natriuretic peptide (BNP) 875.7 (*) 0 - 125 (pg/mL)   BLOOD GAS, ARTERIAL     Status: Abnormal   Collection Time   07/04/11 11:39 PM      Component Value Range Comment   O2 Content 6.0      Delivery systems NASAL CANNULA      pH, Arterial 7.331 (*) 7.350 - 7.450     pCO2 arterial 48.1 (*) 35.0 - 45.0 (mmHg)    pO2, Arterial 71.7 (*) 80.0 - 100.0 (mmHg)    Bicarbonate 24.2 (*) 20.0 - 24.0 (mEq/L)    TCO2 25.6  0 - 100 (mmol/L)    Acid-base deficit 0.6  0.0 - 2.0 (mmol/L)    O2 Saturation 92.4      Patient temperature 101.1      Collection site LEFT  RADIAL  Drawn by [phone]      Sample type ARTERIAL DRAW      Allens test (pass/fail) PASS  PASS    GLUCOSE, CAPILLARY     Status: Abnormal   Collection Time   07/05/11 12:19 AM      Component Value Range Comment   Glucose-Capillary 158 (*) 70 - 99 (mg/dL)   GLUCOSE, CAPILLARY     Status: Abnormal   Collection Time   07/05/11  3:31 AM      Component Value Range Comment   Glucose-Capillary 174 (*) 70 - 99 (mg/dL)   CBC     Status: Abnormal   Collection Time   07/05/11  4:00 AM      Component Value Range Comment   WBC 12.9 (*) 4.0 - 10.5 (K/uL)    RBC 3.05 (*) 4.22 - 5.81 (MIL/uL)    Hemoglobin 10.0 (*) 13.0 - 17.0 (g/dL)    HCT 04.5 (*) 40.9 - 52.0 (%)    MCV 99.0  78.0 - 100.0 (fL)    MCH 32.8  26.0 - 34.0 (pg)    MCHC 33.1  30.0 - 36.0 (g/dL)    RDW 81.1  91.4 - 78.2 (%)    Platelets 160  150 - 400 (K/uL)   DIFFERENTIAL     Status: Abnormal   Collection Time   07/05/11  4:00 AM      Component Value Range Comment   Neutrophils Relative 69  43 - 77 (%)    Neutro Abs 9.0 (*) 1.7 - 7.7 (K/uL)    Lymphocytes Relative 18  12 - 46 (%)    Lymphs Abs 2.4  0.7 - 4.0 (K/uL)    Monocytes Relative 12  3 - 12 (%)    Monocytes Absolute 1.6 (*) 0.1 - 1.0 (K/uL)    Eosinophils Relative 0  0 - 5 (%)    Eosinophils Absolute 0.0  0.0 - 0.7 (K/uL)    Basophils Relative 0  0 - 1 (%)    Basophils Absolute 0.0  0.0 - 0.1 (K/uL)     Dg Lumbar Spine 1 View  07/03/2011  *RADIOLOGY REPORT*  Clinical Data: Thoracolumbar scoliosis.  LUMBAR SPINE - 1 VIEW  Comparison: MRI dated 08/06/2010  Findings: Radiograph #1 demonstrates an instrument at the level of L3-4.  IMPRESSION: Instrument at L3-4.  Original Report Authenticated By: Gwynn Burly, M.D.   Dg Chest Port 1 View  07/04/2011  *RADIOLOGY REPORT*  Clinical Data: Atrial fibrillation; hypoxemia.  PORTABLE CHEST - 1 VIEW  Comparison: Chest radiograph performed 06/23/2011  Findings: The lungs are well-aerated.  Vascular congestion is noted,  without significant pulmonary edema.  Mild bilateral atelectasis is seen.  There is no evidence of pleural effusion or pneumothorax.  The cardiomediastinal silhouette is borderline normal in size.  No acute osseous abnormalities are seen.  Thoracolumbar spinal fusion rods are partially imaged.  IMPRESSION: Vascular congestion, without significant pulmonary edema; mild bilateral atelectasis seen.  Original Report Authenticated By: Tonia Ghent, M.D.   Dg C-arm Gt 120 Min-no Report  07/03/2011  CLINICAL DATA: T10-L5 Fusion   C-ARM GT 120 MINUTE  Fluoroscopy was utilized by the requesting physician.  No radiographic  interpretation.      Review of Systems  Constitutional: Positive for fever. Negative for chills.  HENT: Negative.   Eyes: Negative.   Respiratory: Negative.   Cardiovascular: Positive for palpitations. Negative for chest pain, orthopnea, leg swelling and PND.  Gastrointestinal: Negative.   Genitourinary: Negative.  Musculoskeletal:       S/p spinal surgery  Skin: Negative.   Neurological: Negative.   Endo/Heme/Allergies: Negative.   Psychiatric/Behavioral: Negative.    Blood pressure 97/59, pulse 138, temperature 100 F (37.8 C), temperature source Oral, resp. rate 17, height 5\' 8"  (1.727 m), weight 190 lb 0.6 oz (86.2 kg), SpO2 93.00%. Physical Exam  Constitutional: He is oriented to person, place, and time. No distress.  HENT:  Head: Normocephalic and atraumatic.  Cardiovascular: S2 normal and normal pulses.  An irregularly irregular rhythm present. Tachycardia present.  PMI is not displaced.  Exam reveals no distant heart sounds and no friction rub.   Murmur heard.  Systolic murmur is present with a grade of 1/6  Respiratory: He has decreased breath sounds. He has no wheezes. He has no rhonchi. He has no rales.  GI: Soft. Normal appearance. He exhibits no distension. There is no tenderness.  Musculoskeletal: He exhibits no edema and no tenderness.  Neurological: He is  alert and oriented to person, place, and time.  Skin: Skin is warm and dry. No rash noted. He is not diaphoretic. No erythema. No pallor.  Psychiatric: He has a normal mood and affect.    Assessment/Plan: First ever episode of AF(Post op) CAD s/p PCI to LCX    This is the first time ever patient has had AF. He is currently responding modestly to cardizem and tolerating the rhythm as best as he can at the moment. He should probably be cadioverted today if he doesn't spontaneously convert to sinus. Given the relatively high catecholaminergic state post op, he may recur and should this happen he may need to be started on amiodarone short term. Prior to cardioversion, the need for anticoagulation should be considered. If he can be anticoagulated then he should start heparin shortly before cardioversion. If he cannot be anticoagulated at this time and he remains in AF for up to 48hrs, he will need a TEE prior to cardioversion eventually. In the meantime, obtain serial cardiac enzymes and 2D echo. Cardiology will follow.   Grandville Silos 07/05/2011, 5:41 AM   Addendum Patient's HR was still found to be suboptimal. He had apparently been started on amiodarone after I last saw him. Amiodarone has a 30 - 40 % chance of cardioverting patient into sinus rhythm. Should that happen, there will be a need to start anticoagulation to prevent thrombus forming and subsequent CVA. Per Intensivist and Neurosurgeon anticoagulation is absolutely contraindicated at this time. In that case, I will suggest we stop amiodarone to prevent that scenario.  Will try esmolol drip now and a dose of digoxin 0.125mg  once (Cr Clearance is 32). Amio and diltiazem discontinued.

## 2011-07-05 NOTE — Progress Notes (Signed)
VASCULAR LAB PRELIMINARY  PRELIMINARY  PRELIMINARY  PRELIMINARY  Bilateral:  No evidence of DVT or Baker's Cyst.  Mila Homer, 07/05/2011, 1:04 PM

## 2011-07-05 NOTE — Progress Notes (Signed)
PT Cancellation Note  PT holding treatment today secondary to cardiopulmonary status. Spoke with RN about holding PT, RN in agreement.   Thanks,  Dahlia Client (Beverely Pace) Carleene Mains PT, DPT Acute Rehabilitation [phone]

## 2011-07-05 NOTE — Progress Notes (Signed)
eLink Physician-Brief Progress Note Patient Name: [name] DOB: 12/09/1940 MRN: [id number]  Date of Service  07/05/2011   HPI/Events of Note  Foley d/ced yesterday with orders for I/O cath.  No UOP for pm and night shift.  I/O cath for greater than 500 cc.   eICU Interventions  Plan: Order to place foley since residual greater than 250 cc   Intervention Category Intermediate Interventions: Oliguria - evaluation and management  DETERDING,ELIZABETH 07/05/2011, 6:33 AM

## 2011-07-05 NOTE — Progress Notes (Signed)
*  PRELIMINARY RESULTS* Echocardiogram 2D Echocardiogram has been performed.  Clide Deutscher 07/05/2011, 9:45 AM

## 2011-07-05 NOTE — Progress Notes (Signed)
Patient ID: Curtis Frazier, male   DOB: 08-09-1940, 71 y.o.   MRN: [id number] Subjective:  Patient is alert and pleasant.  Objective: Vital signs in last 24 hours: Temp:  [98.3 F (36.8 C)-101.1 F (38.4 C)] 98.3 F (36.8 C) (01/23 0700) Pulse Rate:  [67-163] 118  (01/23 1100) Resp:  [7-23] 18  (01/23 1100) BP: (80-134)/(42-69) 87/46 mmHg (01/23 1100) SpO2:  [86 %-98 %] 97 % (01/23 1100) FiO2 (%):  [50 %-100 %] 100 % (01/23 1100)  Intake/Output from previous day: 01/22 0701 - 01/23 0700 In: 4788.9 [P.O.:600; I.V.:3684.9; IV Piggyback:504] Out: 1280 [Urine:1280] Intake/Output this shift: Total I/O In: 80 [I.V.:80] Out: 300 [Urine:300]  Physical exam the patient is alert and oriented. History is normal in his lower extremities. He is mildly tachypneic.  Lab Results:  Basename 07/05/11 0400 07/04/11 0400  WBC 12.9* 7.1  HGB 10.0* 9.7*  HCT 30.2* 28.1*  PLT 160 166   BMET  Basename 07/05/11 0953 07/05/11 0400  NA 134* 135  K 5.1 4.3  CL 101 99  CO2 28 21  GLUCOSE 193* 200*  BUN 30* 29*  CREATININE 1.89* 1.85*  CALCIUM 7.7* 7.8*    Studies/Results: Dg Chest Port 1 View  07/04/2011  *RADIOLOGY REPORT*  Clinical Data: Atrial fibrillation; hypoxemia.  PORTABLE CHEST - 1 VIEW  Comparison: Chest radiograph performed 06/23/2011  Findings: The lungs are well-aerated.  Vascular congestion is noted, without significant pulmonary edema.  Mild bilateral atelectasis is seen.  There is no evidence of pleural effusion or pneumothorax.  The cardiomediastinal silhouette is borderline normal in size.  No acute osseous abnormalities are seen.  Thoracolumbar spinal fusion rods are partially imaged.  IMPRESSION: Vascular congestion, without significant pulmonary edema; mild bilateral atelectasis seen.  Original Report Authenticated By: Tonia Ghent, M.D.   Dg C-arm Gt 120 Min-no Report  07/03/2011  CLINICAL DATA: T10-L5 Fusion   C-ARM GT 120 MINUTE  Fluoroscopy was utilized by the requesting  physician.  No radiographic  interpretation.      Assessment/Plan: Postop day #2: Neurologically the patient is doing well. We'll mobilize him with PT and OT.  Atrial fibrillation: I appreciate the cardiologist's help.  Hypoxia: I appreciate critical care medicine help. The patient is going to get a chest CT shortly to rule out pulmonary embolism. We may need to anticoagulate him if this is positive.  I've spoken to the patient's family and answer all her questions.    LOS: 2 days     Bresha Hosack D 07/05/2011, 11:15 AM

## 2011-07-05 NOTE — Consult Note (Signed)
Name: Curtis Frazier MRN: [id number] DOB: 30-Oct-1940    LOS: 2  PCCM CONSULTATION NOTE  History of Present Illness: 71 yo WM with h/o CAD and hypertension admitted for decompressive laminectomy and spinal fusion who developed new onset atrial fibrillation postoperatively associated with pulmonary vascular congestion, hypotension and increased oxygen requirements.  Currently reports mild dyspnea, no chest pain.  Lines / Drains: None  Cultures: 1/22  Blood>>>  Antibiotics: None  Tests / Events: 1/21  Decompressive  Past Medical History  Diagnosis Date  . Other and unspecified hyperlipidemia   . Coronary atherosclerosis of unspecified type of vessel, native or graft   . Personal history of colonic polyps   . Actinic keratosis   . Psoriatic arthropathy   . Other psoriasis   . Scoliosis (and kyphoscoliosis), idiopathic   . GI bleed 12/10    cecam AVM  . Gastritis 12/10  . Hyperlipidemia     takes Pravastatin daily  . Hypertension     takes Metoprolol daily  . Myocardial infarction 2010  . Shortness of breath     with exertion  . Pneumonia     hx of-2008  . Headache     occasionally  . Joint pain   . Joint swelling   . Rheumatoid arthritis     takes Methotrexate 7pills weekly  . Degeneration of intervertebral disc, site unspecified   . Chronic back pain     scoliosis/stenosis/radiculopathy,degenerative disc disease  . Psoriasis   . Bruises easily   . Esophageal reflux     takes Omeprazole daily  . History of colonic polyps   . Hemorrhoids   . Kidney stone     hx of  . Blood transfusion 2010  . Type II or unspecified type diabetes mellitus without mention of complication, not stated as uncontrolled     type II;controlled by diet and exercise   Past Surgical History  Procedure Date  . Rotator cuff repair 2000    left   . Colonoscopy   . Vasectomy 1979  . Ptca stent 02/2009    has one stent  . Carotid doppler 10/12    0-39% R and 60-79% left   . Cataract  surgery 2012  . Cardiac catheterization 2010  . Lithotripsy 2009   Prior to Admission medications   Medication Sig Start Date End Date Taking? Authorizing Provider  aspirin 81 MG tablet Take 81 mg by mouth daily.     Yes Historical Provider, MD  Carboxymethylcellulose Sodium (REFRESH OP) Place 2-3 drops into both eyes daily as needed. For dry eyes.    Yes Historical Provider, MD  Cholecalciferol 1000 UNITS capsule Take 1,000 Units by mouth daily.     Yes Historical Provider, MD  CLOBETASOL PROPIONATE E 0.05 % emollient cream Apply 1 application topically 2 (two) times daily as needed. For skin rash. 04/03/11  Yes Historical Provider, MD  clopidogrel (PLAVIX) 75 MG tablet Take 1 tablet (75 mg total) by mouth daily. 12/20/10  Yes Roxy Manns, MD  fish oil-omega-3 fatty acids 1000 MG capsule Take 1 g by mouth daily.    Yes Historical Provider, MD  FOLIC ACID PO Take 1 tablet by mouth daily.    Yes Historical Provider, MD  HYDROcodone-acetaminophen (VICODIN) 5-500 MG per tablet Take 1 tablet by mouth 2 (two) times daily.    Yes Historical Provider, MD  methotrexate (RHEUMATREX) 2.5 MG tablet Take 17.5 mg by mouth once a week. Caution:Chemotherapy. Protect from light.    Yes Historical  Provider, MD  metoprolol succinate (TOPROL-XL) 25 MG 24 hr tablet Take 1 tablet (25 mg total) by mouth daily. 09/21/10  Yes Roxy Manns, MD  multivitamin [facility]) per tablet Take 1 tablet by mouth daily.     Yes Historical Provider, MD  omeprazole (PRILOSEC) 40 MG capsule Take 40 mg by mouth daily.     Yes Historical Provider, MD  pravastatin (PRAVACHOL) 10 MG tablet Take 1 tablet (10 mg total) by mouth daily. 11/25/10  Yes Roxy Manns, MD  nitroGLYCERIN (NITROSTAT) 0.4 MG SL tablet Place 0.4 mg under the tongue every 5 (five) minutes as needed. For chest pain.  04/14/11   Wendall Stade, MD   Allergies Allergies  Allergen Reactions  . Penicillins     REACTION: rash, swelling  . Tetracycline     REACTION: rash,  swelling   Family History Family History  Problem Relation Age of Onset  . Coronary artery disease Mother   . Diabetes Mother   . Heart disease Mother     CAD  . Coronary artery disease Sister   . Hypertension Sister   . Kidney failure Sister   . Heart disease Sister     CAD  . Pancreatic cancer Brother   . Diabetes Brother   . Prostate cancer Brother   . Cancer Brother     pancreatic CA  . Diabetes Brother   . Anesthesia problems Neg Hx   . Hypotension Neg Hx   . Malignant hyperthermia Neg Hx   . Pseudochol deficiency Neg Hx    Social History  reports that he has quit smoking. He does not have any smokeless tobacco history on file. He reports that he does not drink alcohol or use illicit drugs.  Review Of Systems  11 points review of systems is negative with an exception of listed in HPI.  Vital Signs: Temp:  [99.3 F (37.4 C)-101.1 F (38.4 C)] 100 F (37.8 C) (01/23 0000) Pulse Rate:  [67-163] 119  (01/23 0130) Resp:  [12-24] 17  (01/23 0130) BP: (66-142)/(42-121) 83/52 mmHg (01/23 0130) SpO2:  [86 %-97 %] 92 % (01/23 0130) Arterial Line BP: (101-122)/(38-48) 103/38 mmHg (01/22 0900) FiO2 (%):  [50 %-100 %] 92 % (01/23 0130) I/O last 3 completed shifts: In: 7957.5 [P.O.:600; I.V.:5847.5; Blood:310; IV Piggyback:1200] Out: 3025 [Urine:2225; Blood:800]  Physical Examination: General:  Not in acute distress, comfortable, using NRB mask Neuro:  Awake, alert, cooperative   HEENT:  PERRL Neck:  Supple, some JVD  Cardiovascular:  Irregularly irregular rhythm, rapid rate, no murmurs Lungs:  Bilateral diminished air entry, bibasilar rales Abdomen:  Soft, nontender Musculoskeletal:  No edema Skin:  No rash  Ventilator settings: Vent Mode:  [-]  FiO2 (%):  [50 %-100 %] 92 %  Labs and Imaging:  Reviewed.  Please refer to the Assessment and Plan section for relevant results.  Assessment and Plan:  Thoracolumbar scoliosis, degeneration, spinal stenosis s/p  laminectomy and spinal fusion -->  Per Neurosurgery  New onset atrial fibrillation, likely secondary to fluid shifts during surgery. Related hypotension and increased oxygen requirements. No evidence of ischemia.   Lab 07/04/11 2239  TROPONINI <0.30   -->  Chemical cardioversion with Amiodarone (bolus+loading) -->  Cardizem to off -->  Follow cardiac enzymes -->  Heparin is contraindicated given recent spinal surgery  Hypoxemic hypercarbic respiratory failure, likely due to acute pulmonary edema related to atrial fibrillation and postsurgical atelectasis (hypoxemia) and opioids (hypercarvbia)  Lab 07/04/11 2339  PHART 7.331*  PCO2ART  48.1*  PO2ART 71.7*   -->  IVF to [facility] -->  Lasix 40 mg IV x 1 -->  Rate control -->  Incentive spirometry -->  Monitor respiratory status -->  Titrate FiO2 down, goal SpO2>92%  Coronary artery disease without active ischemia -->  Restart ASA / Plavix when OK with Neurosurgery -->  Restart  Pravachol in AM -->  Restart Metoprolol when hypotension resolved  Fever, source is not clear, resolving leukocytosis  Lab 07/04/11 0400 07/04/11  WBC 7.1 11.2*   -->  Monitor temp / WBC  Hyperglycemia  Lab 07/05/11 0019 07/04/11 1943 07/04/11 1543 07/04/11 1202 07/04/11 0800  GLUCAP 158* 185* 182* 200* 170*   -->SSI  Best practices / Disposition -->  ICU status under PCCM -->  Neurosurgery following -->  Cardiology consulting -->  Full code -->  SCDs for DVT Px -->  GI Px is not indicated  Orlean Bradford, M.D. Pulmonary and Critical Care Medicine [facility] Cell: [phone] Pager: [phone]  07/05/2011, 2:15 AM

## 2011-07-06 DIAGNOSIS — I4891 Unspecified atrial fibrillation: Secondary | ICD-10-CM

## 2011-07-06 DIAGNOSIS — J810 Acute pulmonary edema: Secondary | ICD-10-CM

## 2011-07-06 DIAGNOSIS — J9600 Acute respiratory failure, unspecified whether with hypoxia or hypercapnia: Secondary | ICD-10-CM

## 2011-07-06 DIAGNOSIS — R7989 Other specified abnormal findings of blood chemistry: Secondary | ICD-10-CM

## 2011-07-06 LAB — GLUCOSE, CAPILLARY
Glucose-Capillary: 141 mg/dL — ABNORMAL HIGH (ref 70–99)
Glucose-Capillary: 162 mg/dL — ABNORMAL HIGH (ref 70–99)

## 2011-07-06 MED ORDER — AMIODARONE HCL IN DEXTROSE 360-4.14 MG/200ML-% IV SOLN
1.0000 mg/min | INTRAVENOUS | Status: AC
  Administered 2011-07-06: 1 mg/min via INTRAVENOUS
  Filled 2011-07-06: qty 200

## 2011-07-06 MED ORDER — INSULIN ASPART 100 UNIT/ML ~~LOC~~ SOLN
0.0000 [IU] | Freq: Every day | SUBCUTANEOUS | Status: DC
  Administered 2011-07-08: 3 [IU] via SUBCUTANEOUS

## 2011-07-06 MED ORDER — ASPIRIN EC 81 MG PO TBEC
81.0000 mg | DELAYED_RELEASE_TABLET | Freq: Every day | ORAL | Status: DC
  Administered 2011-07-07 – 2011-07-11 (×5): 81 mg via ORAL
  Filled 2011-07-06 (×5): qty 1

## 2011-07-06 MED ORDER — MORPHINE SULFATE 2 MG/ML IJ SOLN
2.0000 mg | INTRAMUSCULAR | Status: DC | PRN
  Administered 2011-07-07 – 2011-07-08 (×3): 2 mg via INTRAVENOUS
  Filled 2011-07-06 (×3): qty 1

## 2011-07-06 MED ORDER — METOPROLOL TARTRATE 25 MG PO TABS
25.0000 mg | ORAL_TABLET | Freq: Four times a day (QID) | ORAL | Status: DC
  Administered 2011-07-06 – 2011-07-09 (×10): 25 mg via ORAL
  Filled 2011-07-06 (×17): qty 1

## 2011-07-06 MED ORDER — FOLIC ACID 1 MG PO TABS
1.0000 mg | ORAL_TABLET | Freq: Every day | ORAL | Status: DC
  Administered 2011-07-07 – 2011-07-11 (×5): 1 mg via ORAL
  Filled 2011-07-06 (×5): qty 1

## 2011-07-06 MED ORDER — ONDANSETRON HCL 4 MG/2ML IJ SOLN: 4.0000 mg | Freq: Four times a day (QID) | INTRAMUSCULAR | Status: DC | PRN

## 2011-07-06 MED ORDER — AMIODARONE LOAD VIA INFUSION
150.0000 mg | Freq: Once | INTRAVENOUS | Status: AC
  Administered 2011-07-06: 150 mg via INTRAVENOUS
  Filled 2011-07-06: qty 83.34

## 2011-07-06 MED ORDER — AMIODARONE HCL IN DEXTROSE 360-4.14 MG/200ML-% IV SOLN
0.5000 mg/min | INTRAVENOUS | Status: DC
  Administered 2011-07-06 – 2011-07-08 (×4): 0.5 mg/min via INTRAVENOUS
  Filled 2011-07-06 (×13): qty 200

## 2011-07-06 MED ORDER — OMEGA-3-ACID ETHYL ESTERS 1 G PO CAPS
1.0000 g | ORAL_CAPSULE | Freq: Every day | ORAL | Status: DC
  Administered 2011-07-07 – 2011-07-11 (×4): 1 g via ORAL
  Filled 2011-07-06 (×5): qty 1

## 2011-07-06 MED ORDER — INSULIN ASPART 100 UNIT/ML ~~LOC~~ SOLN
0.0000 [IU] | Freq: Three times a day (TID) | SUBCUTANEOUS | Status: DC
  Administered 2011-07-06 (×2): 3 [IU] via SUBCUTANEOUS
  Administered 2011-07-07: 2 [IU] via SUBCUTANEOUS
  Administered 2011-07-07: 3 [IU] via SUBCUTANEOUS
  Administered 2011-07-07: 5 [IU] via SUBCUTANEOUS
  Administered 2011-07-08: 2 [IU] via SUBCUTANEOUS
  Administered 2011-07-08: 3 [IU] via SUBCUTANEOUS
  Administered 2011-07-08: 2 [IU] via SUBCUTANEOUS
  Administered 2011-07-09 – 2011-07-10 (×3): 3 [IU] via SUBCUTANEOUS
  Administered 2011-07-10 (×2): 2 [IU] via SUBCUTANEOUS
  Administered 2011-07-11 (×2): 3 [IU] via SUBCUTANEOUS

## 2011-07-06 MED ORDER — OXYCODONE-ACETAMINOPHEN 5-325 MG PO TABS
1.0000 | ORAL_TABLET | ORAL | Status: DC | PRN
  Administered 2011-07-06 – 2011-07-07 (×5): 1 via ORAL
  Administered 2011-07-08 (×2): 2 via ORAL
  Administered 2011-07-09 (×3): 1 via ORAL
  Administered 2011-07-09: 2 via ORAL
  Administered 2011-07-10 – 2011-07-11 (×4): 1 via ORAL
  Filled 2011-07-06: qty 1
  Filled 2011-07-06: qty 2
  Filled 2011-07-06 (×2): qty 1
  Filled 2011-07-06 (×2): qty 2
  Filled 2011-07-06 (×3): qty 1
  Filled 2011-07-06 (×2): qty 2
  Filled 2011-07-06 (×4): qty 1

## 2011-07-06 MED ORDER — SIMVASTATIN 5 MG PO TABS
5.0000 mg | ORAL_TABLET | Freq: Every day | ORAL | Status: DC
  Administered 2011-07-06 – 2011-07-09 (×2): 5 mg via ORAL
  Filled 2011-07-06 (×6): qty 1

## 2011-07-06 MED ORDER — DIPHENHYDRAMINE HCL 50 MG/ML IJ SOLN: 12.5000 mg | Freq: Four times a day (QID) | INTRAMUSCULAR | Status: DC | PRN

## 2011-07-06 MED ORDER — NITROGLYCERIN 0.4 MG SL SUBL: 0.4000 mg | SUBLINGUAL_TABLET | SUBLINGUAL | Status: DC | PRN

## 2011-07-06 MED ORDER — DIPHENHYDRAMINE HCL 12.5 MG/5ML PO ELIX
12.5000 mg | ORAL_SOLUTION | Freq: Four times a day (QID) | ORAL | Status: DC | PRN
  Filled 2011-07-06: qty 5

## 2011-07-06 MED ORDER — SODIUM CHLORIDE 0.9 % IJ SOLN: 9.0000 mL | INTRAMUSCULAR | Status: DC | PRN

## 2011-07-06 MED ORDER — CHLORPROMAZINE HCL 25 MG PO TABS
25.0000 mg | ORAL_TABLET | Freq: Three times a day (TID) | ORAL | Status: AC
  Administered 2011-07-06 – 2011-07-08 (×6): 25 mg via ORAL
  Filled 2011-07-06 (×6): qty 1

## 2011-07-06 MED ORDER — INSULIN ASPART 100 UNIT/ML ~~LOC~~ SOLN
4.0000 [IU] | Freq: Three times a day (TID) | SUBCUTANEOUS | Status: DC
  Administered 2011-07-06 – 2011-07-11 (×6): 4 [IU] via SUBCUTANEOUS

## 2011-07-06 MED ORDER — VITAMIN D3 25 MCG (1000 UNIT) PO TABS
1000.0000 [IU] | ORAL_TABLET | Freq: Every day | ORAL | Status: DC
  Administered 2011-07-07 – 2011-07-11 (×4): 1000 [IU] via ORAL
  Filled 2011-07-06 (×5): qty 1

## 2011-07-06 MED ORDER — NALOXONE HCL 0.4 MG/ML IJ SOLN: 0.4000 mg | INTRAMUSCULAR | Status: DC | PRN

## 2011-07-06 MED FILL — Heparin Sodium (Porcine) Inj 1000 Unit/ML: INTRAMUSCULAR | Qty: 30 | Status: AC

## 2011-07-06 MED FILL — Sodium Chloride Irrigation Soln 0.9%: Qty: 3000 | Status: AC

## 2011-07-06 MED FILL — Sodium Chloride IV Soln 0.9%: INTRAVENOUS | Qty: 1000 | Status: AC

## 2011-07-06 NOTE — Progress Notes (Signed)
Patient ID: Curtis Frazier, male   DOB: [birth year], 71 y.o.   MRN: [id number] @ Subjective:  Denies SSCP, palpitations or Dyspnea   Objective:  Filed Vitals:   07/06/11 0500 07/06/11 0600 07/06/11 0700 07/06/11 0756  BP: 115/60 108/49 135/58   Pulse: 94 93 99   Temp:   99.3 F (37.4 C)   TempSrc:   Oral   Resp: 18 14 21 13   Height:      Weight:      SpO2: 91% 96% 96% 92%    Intake/Output from previous day:  Intake/Output Summary (Last 24 hours) at 07/06/11 0808 Last data filed at 07/06/11 0700  Gross per 24 hour  Intake    420 ml  Output   1750 ml  Net  -1330 ml    Physical Exam: Pale male in some distress from back Lungs clear S1/S2 SEM Abd soft nontender BS positive S/P lumbar surgery No edema Plus 2 PT Neuro nonfocal  Lab Results: Basic Metabolic Panel:  Basename 07/05/11 0953 07/05/11 0400  NA 134* 135  K 5.1 4.3  CL 101 99  CO2 28 21  GLUCOSE 193* 200*  BUN 30* 29*  CREATININE 1.89* 1.85*  CALCIUM 7.7* 7.8*  MG -- 1.5  PHOS -- 3.3   Liver Function Tests:  Basename 07/05/11 0400  AST 85*  ALT 44  ALKPHOS 59  BILITOT 0.4  PROT 5.4*  ALBUMIN 2.3*   No results found for this basename: LIPASE:2,AMYLASE:2 in the last 72 hours CBC:  Basename 07/05/11 0400 07/04/11 0400  WBC 12.9* 7.1  NEUTROABS 9.0* --  HGB 10.0* 9.7*  HCT 30.2* 28.1*  MCV 99.0 95.6  PLT 160 166   Cardiac Enzymes:  Basename 07/05/11 0946 07/05/11 0500 07/04/11 2239  CKTOTAL 3105* 3738* 4245*  CKMB 4.3* 5.1* 5.4*  CKMBINDEX -- -- --  TROPONINI 0.47* 0.53* <0.30    Imaging: Ct Angio Chest W/cm &/or Wo Cm  07/05/2011  *RADIOLOGY REPORT*  Clinical Data: Shortness of breath.  CT ANGIOGRAPHY CHEST  Technique:  Multidetector CT imaging of the chest using the standard protocol during bolus administration of intravenous contrast. Multiplanar reconstructed images including MIPs were obtained and reviewed to evaluate the vascular anatomy.  Contrast: 80mL OMNIPAQUE IOHEXOL 350 MG/ML  IV SOLN  Comparison: Chest x-ray 07/04/2011.  Findings: No filling defects in the pulmonary arteries to suggest pulmonary emboli.  There is cardiomegaly.  Coronary artery calcifications.  Aorta is normal caliber.  Esophagus is mildly dilated and fluid-filled. No mediastinal, hilar, or axillary adenopathy.  Small bilateral pleural effusions with compressive atelectasis in the lower lobes.  Mild vascular congestion.  No overt edema. Imaging into the upper abdomen shows no acute findings.  The superior aspect of posterior fusion changes are noted in the lower thoracic and visualized upper lumbar spine.  No acute bony abnormality.  IMPRESSION: No evidence of pulmonary embolus.  Small bilateral pleural effusions with compressive atelectasis in the lower lobes.  Cardiomegaly.  Mildly dilated esophagus which is fluid filled.  Cannot exclude reflux or dysmotility.  Original Report Authenticated By: Cyndie Chime, M.D.   Dg Chest Port 1 View  07/04/2011  *RADIOLOGY REPORT*  Clinical Data: Atrial fibrillation; hypoxemia.  PORTABLE CHEST - 1 VIEW  Comparison: Chest radiograph performed 06/23/2011  Findings: The lungs are well-aerated.  Vascular congestion is noted, without significant pulmonary edema.  Mild bilateral atelectasis is seen.  There is no evidence of pleural effusion or pneumothorax.  The cardiomediastinal silhouette is borderline  normal in size.  No acute osseous abnormalities are seen.  Thoracolumbar spinal fusion rods are partially imaged.  IMPRESSION: Vascular congestion, without significant pulmonary edema; mild bilateral atelectasis seen.  Original Report Authenticated By: Tonia Ghent, M.D.    Cardiac Studies:   Telemetry:  NSR rate around 100  Echo:   Medications:     . docusate sodium  100 mg Oral BID  . insulin aspart  0-15 Units Subcutaneous Q4H  . metoprolol tartrate  25 mg Oral BID  . morphine   Intravenous Q4H  . pantoprazole  40 mg Oral Q1200  . sodium chloride  750 mL  Intravenous Once       . lactated ringers 20 mL/hr at 07/05/11 0829  . DISCONTD: esmolol 30 mcg/kg/min (07/05/11 1400)    Assessment/Plan:  PAF:  Increase lopresser to 25 q6 try to decrease adrenergic tone.  Keep Hct above 30 and control pain.  Add iv amiodarone for next 48 hrs as there is risk for recurrence.  Ok to use amiodarone first 48 hours of afib occurrence negligible risk of embolic event CAD:  Enzymes elevated from back surgery No evidence of acute coronary syndrome SEM:  AV sclerosis.  Charlton Haws 07/06/2011, 8:08 AM

## 2011-07-06 NOTE — Progress Notes (Signed)
Patient ID: Curtis Frazier, male   DOB: 02/25/1941, 71 y.o.   MRN: [id number] Subjective:  The patient is alert and pleasant. He looks and feels much better today. He complains of hiccups. His back is appropriately sore.  Objective: Vital signs in last 24 hours: Temp:  [99.3 F (37.4 C)-102 F (38.9 C)] 99.3 F (37.4 C) (01/24 1129) Pulse Rate:  [55-116] 79  (01/24 1400) Resp:  [11-22] 11  (01/24 1400) BP: (92-135)/(43-83) 97/59 mmHg (01/24 1400) SpO2:  [74 %-96 %] 89 % (01/24 1400)  Intake/Output from previous day: 01/23 0701 - 01/24 0700 In: 460 [I.V.:460] Out: 2050 [Urine:2050] Intake/Output this shift: Total I/O In: 140 [I.V.:140] Out: 30 [Urine:30]  Physical exam the patient is alert and oriented. His lower extremity strength is grossly normal.  Lab Results:  Basename 07/05/11 0400 07/04/11 0400  WBC 12.9* 7.1  HGB 10.0* 9.7*  HCT 30.2* 28.1*  PLT 160 166   BMET  Basename 07/05/11 0953 07/05/11 0400  NA 134* 135  K 5.1 4.3  CL 101 99  CO2 28 21  GLUCOSE 193* 200*  BUN 30* 29*  CREATININE 1.89* 1.85*  CALCIUM 7.7* 7.8*    Studies/Results: Ct Angio Chest W/cm &/or Wo Cm  07/05/2011  *RADIOLOGY REPORT*  Clinical Data: Shortness of breath.  CT ANGIOGRAPHY CHEST  Technique:  Multidetector CT imaging of the chest using the standard protocol during bolus administration of intravenous contrast. Multiplanar reconstructed images including MIPs were obtained and reviewed to evaluate the vascular anatomy.  Contrast: 80mL OMNIPAQUE IOHEXOL 350 MG/ML IV SOLN  Comparison: Chest x-ray 07/04/2011.  Findings: No filling defects in the pulmonary arteries to suggest pulmonary emboli.  There is cardiomegaly.  Coronary artery calcifications.  Aorta is normal caliber.  Esophagus is mildly dilated and fluid-filled. No mediastinal, hilar, or axillary adenopathy.  Small bilateral pleural effusions with compressive atelectasis in the lower lobes.  Mild vascular congestion.  No overt edema.  Imaging into the upper abdomen shows no acute findings.  The superior aspect of posterior fusion changes are noted in the lower thoracic and visualized upper lumbar spine.  No acute bony abnormality.  IMPRESSION: No evidence of pulmonary embolus.  Small bilateral pleural effusions with compressive atelectasis in the lower lobes.  Cardiomegaly.  Mildly dilated esophagus which is fluid filled.  Cannot exclude reflux or dysmotility.  Original Report Authenticated By: Cyndie Chime, M.D.   Dg Chest Port 1 View  07/04/2011  *RADIOLOGY REPORT*  Clinical Data: Atrial fibrillation; hypoxemia.  PORTABLE CHEST - 1 VIEW  Comparison: Chest radiograph performed 06/23/2011  Findings: The lungs are well-aerated.  Vascular congestion is noted, without significant pulmonary edema.  Mild bilateral atelectasis is seen.  There is no evidence of pleural effusion or pneumothorax.  The cardiomediastinal silhouette is borderline normal in size.  No acute osseous abnormalities are seen.  Thoracolumbar spinal fusion rods are partially imaged.  IMPRESSION: Vascular congestion, without significant pulmonary edema; mild bilateral atelectasis seen.  Original Report Authenticated By: Tonia Ghent, M.D.    Assessment/Plan: Postop day #3: The patient is doing well neurologically. We will discontinue his Foley catheter.  Atrial fibrillation: This has converted to normal sinus rhythm. Patient doesn't have any chest pain.  Renal insufficiency: This has been stable.  Hiccups: This has been going on for a couple days. I'll start Thorazine.  LOS: 3 days     Kashonda Sarkisyan D 07/06/2011, 2:46 PM

## 2011-07-06 NOTE — Progress Notes (Addendum)
Name: Curtis Frazier MRN: [id number] DOB: 04/09/1941    LOS: 3  PCCM CONSULTATION NOTE  History of Present Illness: 71 yo WM with h/o CAD and hypertension admitted for decompressive laminectomy and spinal fusion who developed new onset atrial fibrillation postoperatively associated with pulmonary vascular congestion, hypotension and increased oxygen requirements.  Currently reports mild dyspnea, no chest pain.  Lines / Drains: None  Cultures: 1/22  Blood>>>  Antibiotics: None  Tests / Events: 1/21  Surgery: Decompressive laminectomy at L12, L2-3, L4-5; posterior segmental agitation T10-L5 with globus titanium pedicle screws and rods; T10-11, and T11-12, T12-L1, L1-2, L2-3, L3-4, L4-5 posterior lateral arthrodesis with local morselized autograft bone, bone morphogenic protein, Actifuse bone graft extender. 1/22 AFRVR, hypoxemia - converted to sinus rhythm with esmolol gtt 1/23 R/O MI by cardiac markers 1/23 Echo LVEF 60-65%. AS - mild by gradient. Mod-severe by appearance 1/23 LE venous dopplers negative 1/23 CTA chest: No PE. Small B effusions and dependent atx 1/24 amiodarone gtt started by Cards  Consultants PCCM 1/22 - 1/24 [facility] Cards 1/23 -    Vital Signs: Temp:  [99.3 F (37.4 C)-102 F (38.9 C)] 99.3 F (37.4 C) (01/24 0700) Pulse Rate:  [92-118] 99  (01/24 0700) Resp:  [7-22] 13  (01/24 0756) BP: (80-135)/(43-60) 135/58 mmHg (01/24 0700) SpO2:  [88 %-98 %] 92 % (01/24 0756) FiO2 (%):  [100 %] 100 % (01/23 1100) I/O last 3 completed shifts: In: 3739.9 [I.V.:3235.9; IV Piggyback:504] Out: 3330 [Urine:3330]  Physical Examination: General:  NAD, comfortable on [place] O2 Neuro:  Awake, alert, cooperative   HEENT:  PERRL Neck:  Supple, some JVD  Cardiovascular:  RRR s M Lungs:  No wheezes, faint bibasilar crackles Abdomen:  Soft, nontender Musculoskeletal:  No edema Skin:  No rash   Labs and Imaging:  See above  Assessment and Plan:  Thoracolumbar scoliosis,  degeneration, spinal stenosis s/p laminectomy and spinal fusion -->  Post op mgmt per Neurosurgery  New onset atrial fibrillation - has converted to sinus rhythm now. Hemodynamically stable. Cards following   Lab 07/05/11 0946 07/05/11 0500 07/04/11 2239  TROPONINI 0.47* 0.53* <0.30    Hypoxemic hypercarbic respiratory failure - much improved  -- Cont to wean O2 to off  Coronary artery disease without active ischemia -->  Restart ASA / Plavix when OK with Neurosurgery -->  Restart  Pravachol in AM --    Cont metoprolol - dose increased this AM by Cards  Fever - no evidence of active infectious process -->  Monitor   Hyperglycemia -->Change SSI to ACHS  Best practices / Disposition -->  ICU status under Neurosurgery -->  Cardiology consulting -->  Full code -->  SCDs for DVT Px -->  GI Px is not indicated  From PCCM perspective, he can be transferred to telemetry bed @ any time.  PCCM will sign off. Please call if we can be of further assistance  Billy Fischer, MD;  PCCM service; Mobile [phone]  07/06/2011, 8:45 AM  ADD: Acute Renal Insuff:  Cr has bumped - likely due to obstructive uropathy which has resolved after replacement of Foley catheter. Recheck BMET AM 1/25

## 2011-07-06 NOTE — Clinical Documentation Improvement (Signed)
RENAL FAILURE DOCUMENTATION CLARIFICATION QUERY  THIS DOCUMENT IS NOT A PERMANENT PART OF THE MEDICAL RECORD  TO RESPOND TO THE THIS QUERY, FOLLOW THE INSTRUCTIONS BELOW:  1. If needed, update documentation for the patient's encounter via the notes activity.  2. Access this query again and click edit on the In [employer].  3. After updating, or not, click F2 to complete all highlighted (required) fields concerning your review. Select "additional documentation in the medical record" OR "no additional documentation provided".  4. Click Sign note button.  5. The deficiency will fall out of your In Basket *Please let us know if you are not able to complete this workflow by phone or e-mail (listed below).  Please update your documentation within the medical record to reflect your response to this query.                                                                                    07/06/11  Dear Dr.Soley Harriss  In a better effort to capture your patient's severity of illness, reflect appropriate length of stay and utilization of resources, a review of the patient medical record has revealed the following indicators.   Based on your clinical judgment, please clarify and document in a progress note and/or discharge summary the clinical condition associated with the following supporting information: In responding to this query please exercise your independent judgment.  The fact that a query is asked, does not imply that any particular answer is desired or expected.  Please consider the below (if your clinical findings/judgment agree) as you document the patient's diagnosis/condition(s) in the progress note and discharge summary. Thank you!  Possible Clinical Conditions?  - Acute Renal Failure  - Acute Kidney Injury  - Other condition (please document in the progress notes and/or discharge summary)  - Cannot Clinically determine at this time   Supporting Information:  - Component  Creat  Latest Ref Rng 0.50 - 1.35 mg/dL  1/61/0960 4.54 (H)  0/98/1191 1.30  07/05/2011 1.85 (H)  07/05/2011 1.89 (H)   - Component BUN  Latest Ref Rng 6 - 23 mg/dL  4/78/2956 23  07/26/863 22  07/05/2011 29 (H)  07/05/2011 30 (H)        Supporting Information:  Risk Factors:  Signs and Symptoms:  Diagnostics:  Lab:  Baseline Cr Level :  Current Creatinine Level (trend):  Baseline BUN Level :        Current BUN Level (trend):  Electrolytes: GFR:  Treatments:                   You may use possible, probable, or suspect with inpatient documentation. possible, probable, suspected diagnoses MUST be documented at the time of discharge  Reviewed: additional documentation in the medical record  Thank You,  Saul Fordyce  Clinical Documentation Specialist: [phone] Pager  Health Information Management [place]

## 2011-07-06 NOTE — Progress Notes (Signed)
Physical Therapy Treatment Patient Details Name: Curtis Frazier MRN: [id number] DOB: Mar 15, 1941 Today's Date: 07/06/2011  PT Assessment/Plan  PT - Assessment/Plan Comments on Treatment Session: Patient with signifcant weakness with functional mobility - he may require rehab at discharge PT Plan: Frequency remains appropriate;Discharge plan needs to be updated Recommendations for Other Services: Rehab consult Follow Up Recommendations: Inpatient Rehab Equipment Recommended: Defer to next venue PT Goals  Acute Rehab PT Goals PT Goal: Rolling Supine to Right Side - Progress: Progressing toward goal PT Goal: Supine/Side to Sit - Progress: Progressing toward goal PT Goal: Sit to Stand - Progress: Progressing toward goal PT Goal: Stand to Sit - Progress: Progressing toward goal  PT Treatment Precautions/Restrictions  Precautions Precautions: Back Precaution Comments: Re-educated in back precautions Required Braces or Orthoses: Yes Spinal Brace: Lumbar corset;Applied in sitting position (Not specified in notes) Restrictions Weight Bearing Restrictions: No Mobility (including Balance) Bed Mobility Rolling Right: 2: Max assist;With rail Rolling Right Details (indicate cue type and reason): Verbal cues for log roll technique - patient with limited (A) with transition Right Sidelying to Sit: 2: Max assist;HOB flat Right Sidelying to Sit Details (indicate cue type and reason): Verbal cues for correct sequencing of lower extremities and trunk. Assist mostly for initiation then patient able to increase effort.  Transfers Sit to Stand: 3: Mod assist;With upper extremity assist;From bed;From elevated surface Sit to Stand Details (indicate cue type and reason): Patient required cues for initiation secondary to desire to pull up on rolling walker. Required PT assistance to stabilize in addition to walker Stand to Sit: 3: Mod assist;With upper extremity assist;To chair/3-in-1 Stand to Sit Details:  Patient with difficulty locating arm rests without hand over hand and appeard to be reluctant to sit. (A) to control descent. Stand Pivot Transfers: 3: Mod assist Stand Pivot Transfer Details (indicate cue type and reason): With rolling walker. Good foot clearance with pivotal stepping initially then declined in balance and tendency to shuffle feet. Approx 2 feet to chair.  Static Sitting Balance Static Sitting - Balance Support: Bilateral upper extremity supported;Feet supported Static Sitting - Level of Assistance: 5: Stand by assistance End of Session PT - End of Session Equipment Utilized During Treatment: Back brace Activity Tolerance: Patient limited by pain;Patient limited by fatigue Patient left: in chair;with call bell in reach Nurse Communication: Mobility status for transfers General Behavior During Session: [facility] for tasks performed Cognition: Impaired Cognitive Impairment: Slow to process information  Edwyna Perfect, PT  Pager [phone]  07/06/2011, 8:56 AM

## 2011-07-06 NOTE — Progress Notes (Signed)
Occupational Therapy Treatment Patient Details Name: Curtis Frazier MRN: [id number] DOB: [birth year] Today's Date: 07/06/2011  OT Assessment/Plan OT Assessment/Plan Comments on Treatment Session: Pt fatigued easily with stand pivot transfer from bed to chair.  Provided education to reinforce maintaining back precautions during ADLs and functional transfers. OT Plan: Discharge plan needs to be updated OT Frequency: Min 2X/week Recommendations for Other Services: Rehab consult Follow Up Recommendations: Inpatient Rehab Equipment Recommended: Defer to next venue OT Goals ADL Goals Pt Will Transfer to Toilet: with supervision;with DME;3-in-1;Maintaining back safety precautions ADL Goal: Toilet Transfer - Progress: Progressing toward goals Miscellaneous OT Goals Miscellaneous OT Goal #1: Pt will state 3/3 back precautions independently. OT Goal: Miscellaneous Goal #1 - Progress: Progressing toward goals  OT Treatment Precautions/Restrictions  Precautions Precautions: Back Precaution Comments: Re-educated in back precautions Required Braces or Orthoses: Yes Spinal Brace: Lumbar corset;Applied in sitting position   ADL ADL Toilet Transfer: Simulated;Moderate assistance Toilet Transfer Details (indicate cue type and reason): Mod assist for steadying and to manuever RW. VC for sequencing and safe hand placement during descent.  Transferred from bed to chair. Toilet Transfer Method: Stand pivot Equipment Used: Rolling walker ADL Comments: Pt donned brace with total assist sitting EOB. Mobility  Bed Mobility Bed Mobility: Yes Rolling Right: 2: Max assist;With rail Rolling Right Details (indicate cue type and reason): Manual and verbal cues for rolling Right Sidelying to Sit: 2: Max assist;HOB flat Right Sidelying to Sit Details (indicate cue type and reason): Assist to bring trunk upright as legs decend off bed Transfers Sit to Stand: 3: Mod assist;With upper extremity assist;From  bed;From elevated surface Sit to Stand Details (indicate cue type and reason): VC to place hands on bed for safety. Required assist to faciliatate lift off into standing and to steady while obtaining upright posture. Stand to Sit: 3: Mod assist;With upper extremity assist;To chair/3-in-1 Stand to Sit Details: Mod assist to control descent Exercises    End of Session OT - End of Session Equipment Utilized During Treatment: Gait belt Activity Tolerance: Patient limited by pain Patient left: in chair;with call bell in reach Nurse Communication: Mobility status for transfers General Behavior During Session: [facility] for tasks performed Cognition: Impaired Cognitive Impairment: Slow to process information  Cipriano Mile  07/06/2011, 5:26 PM 07/06/2011 Cipriano Mile OTR/L Pager [phone] Office [phone]

## 2011-07-07 ENCOUNTER — Inpatient Hospital Stay (HOSPITAL_COMMUNITY): Payer: Medicare Other

## 2011-07-07 LAB — GLUCOSE, CAPILLARY
Glucose-Capillary: 182 mg/dL — ABNORMAL HIGH (ref 70–99)
Glucose-Capillary: 131 mg/dL — ABNORMAL HIGH (ref 70–99)

## 2011-07-07 LAB — BASIC METABOLIC PANEL
BUN: 31 mg/dL — ABNORMAL HIGH (ref 6–23)
CO2: 27 mEq/L (ref 19–32)
Chloride: 100 mEq/L (ref 96–112)
Glucose, Bld: 160 mg/dL — ABNORMAL HIGH (ref 70–99)
Potassium: 4.3 mEq/L (ref 3.5–5.1)

## 2011-07-07 LAB — CBC
HCT: 22.2 % — ABNORMAL LOW (ref 39.0–52.0)
Hemoglobin: 7.5 g/dL — ABNORMAL LOW (ref 13.0–17.0)
Hemoglobin: 7.8 g/dL — ABNORMAL LOW (ref 13.0–17.0)
MCH: 32.2 pg (ref 26.0–34.0)
MCH: 32.5 pg (ref 26.0–34.0)
MCHC: 33.8 g/dL (ref 30.0–36.0)
MCV: 94.2 fL (ref 78.0–100.0)
RBC: 2.33 MIL/uL — ABNORMAL LOW (ref 4.22–5.81)
RBC: 2.4 MIL/uL — ABNORMAL LOW (ref 4.22–5.81)
WBC: 10.5 10*3/uL (ref 4.0–10.5)

## 2011-07-07 LAB — DIFFERENTIAL
Basophils Absolute: 0 10*3/uL (ref 0.0–0.1)
Basophils Relative: 0 % (ref 0–1)
Eosinophils Absolute: 0 10*3/uL (ref 0.0–0.7)
Monocytes Absolute: 1.2 10*3/uL — ABNORMAL HIGH (ref 0.1–1.0)
Monocytes Relative: 12 % (ref 3–12)
Neutro Abs: 8 10*3/uL — ABNORMAL HIGH (ref 1.7–7.7)

## 2011-07-07 LAB — CARDIAC PANEL(CRET KIN+CKTOT+MB+TROPI)
CK, MB: 2.2 ng/mL (ref 0.3–4.0)
Total CK: 775 U/L — ABNORMAL HIGH (ref 7–232)
Troponin I: <0.3 ng/mL (ref <0.30)

## 2011-07-07 MED ORDER — ALUM & MAG HYDROXIDE-SIMETH 200-200-20 MG/5ML PO SUSP
30.0000 mL | ORAL | Status: DC | PRN
Start: 2011-07-07 — End: 2011-07-11
  Administered 2011-07-07: 30 mL via ORAL
  Filled 2011-07-07: qty 30

## 2011-07-07 NOTE — Progress Notes (Signed)
PT Cancellation Note  Treatment cancelled today due to pt receiving first unit of blood. Will attempt treatment this afternoon pending availability.  Curtis Frazier 07/07/2011, 11:25 AM  07/07/2011 Curtis Frazier DPT PAGER: [phone] OFFICE: [phone]

## 2011-07-07 NOTE — Progress Notes (Signed)
Patient ID: Curtis Frazier, male   DOB: 08/05/1940, 71 y.o.   MRN: [id number] Subjective:  The patient is alert and pleasant. He denies chest pain shortness of breath etc. His back is appropriate is sore.  Objective: Vital signs in last 24 hours: Temp:  [99.3 F (37.4 C)-101.2 F (38.4 C)] 99.6 F (37.6 C) (01/25 0400) Pulse Rate:  [55-104] 73  (01/25 0800) Resp:  [11-23] 17  (01/25 0800) BP: (85-134)/(45-83) 85/45 mmHg (01/25 0800) SpO2:  [74 %-96 %] 92 % (01/25 0800)  Intake/Output from previous day: 01/24 0701 - 01/25 0700 In: 6553.2 [I.V.:6553.2] Out: 1125 [Urine:1125] Intake/Output this shift: Total I/O In: 36.7 [I.V.:36.7] Out: -   Physical exam the patient is alert and oriented. Strength is grossly normal his lower extremities.  Lab Results:  Basename 07/07/11 0400 07/05/11 0400  WBC 10.2 12.9*  HGB 7.5* 10.0*  HCT 22.2* 30.2*  PLT 149* 160   BMET  Basename 07/07/11 0400 07/05/11 0953  NA 134* 134*  K 4.3 5.1  CL 100 101  CO2 27 28  GLUCOSE 160* 193*  BUN 31* 30*  CREATININE 1.42* 1.89*  CALCIUM 8.2* 7.7*    Studies/Results: Ct Angio Chest W/cm &/or Wo Cm  07/05/2011  *RADIOLOGY REPORT*  Clinical Data: Shortness of breath.  CT ANGIOGRAPHY CHEST  Technique:  Multidetector CT imaging of the chest using the standard protocol during bolus administration of intravenous contrast. Multiplanar reconstructed images including MIPs were obtained and reviewed to evaluate the vascular anatomy.  Contrast: 80mL OMNIPAQUE IOHEXOL 350 MG/ML IV SOLN  Comparison: Chest x-ray 07/04/2011.  Findings: No filling defects in the pulmonary arteries to suggest pulmonary emboli.  There is cardiomegaly.  Coronary artery calcifications.  Aorta is normal caliber.  Esophagus is mildly dilated and fluid-filled. No mediastinal, hilar, or axillary adenopathy.  Small bilateral pleural effusions with compressive atelectasis in the lower lobes.  Mild vascular congestion.  No overt edema. Imaging into  the upper abdomen shows no acute findings.  The superior aspect of posterior fusion changes are noted in the lower thoracic and visualized upper lumbar spine.  No acute bony abnormality.  IMPRESSION: No evidence of pulmonary embolus.  Small bilateral pleural effusions with compressive atelectasis in the lower lobes.  Cardiomegaly.  Mildly dilated esophagus which is fluid filled.  Cannot exclude reflux or dysmotility.  Original Report Authenticated By: Cyndie Chime, M.D.    Assessment/Plan: Postop day #4: The patient is doing well neurologically.  Anemia: We will recheck a CBC to make sure this is not a mistake. His hemoglobin is indeed 7.5 he will be transfused.  Low-grade fevers: This may be from atelectasis but we will check cultures to rule out other infections.  Atrial fibrillation: He is back in sinus rhythm I appreciate Dr. Bonney Leitz help.  Renal insufficiency: His creatinine is better today  LOS: 4 days     Fount Bahe D 07/07/2011, 8:30 AM

## 2011-07-07 NOTE — Progress Notes (Signed)
Patient ID: Curtis Frazier, male   DOB: 05-18-1941, 71 y.o.   MRN: [id number] @ Subjective:  Denies SSCP, palpitations or Dyspnea   Objective:  Filed Vitals:   07/07/11 0400 07/07/11 0500 07/07/11 0600 07/07/11 0700  BP: 111/57 101/75 105/59 93/53  Pulse: 72 74 74 72  Temp: 99.6 F (37.6 C)     TempSrc: Oral     Resp: 20 19 17 15   Height:      Weight:      SpO2: 94% 94% 89% 90%    Intake/Output from previous day:  Intake/Output Summary (Last 24 hours) at 07/07/11 0756 Last data filed at 07/07/11 0700  Gross per 24 hour  Intake 6553.15 ml  Output   1125 ml  Net 5428.15 ml    Physical Exam: Pale male in some distress from back Lungs clear S1/S2 SEM Abd soft nontender BS positive S/P lumbar surgery No edema Plus 2 PT Neuro nonfocal  Lab Results: Basic Metabolic Panel:  Basename 07/07/11 0400 07/05/11 0953 07/05/11 0400  NA 134* 134* --  K 4.3 5.1 --  CL 100 101 --  CO2 27 28 --  GLUCOSE 160* 193* --  BUN 31* 30* --  CREATININE 1.42* 1.89* --  CALCIUM 8.2* 7.7* --  MG -- -- 1.5  PHOS -- -- 3.3   Liver Function Tests:  Basename 07/05/11 0400  AST 85*  ALT 44  ALKPHOS 59  BILITOT 0.4  PROT 5.4*  ALBUMIN 2.3*   No results found for this basename: LIPASE:2,AMYLASE:2 in the last 72 hours CBC:  Basename 07/07/11 0400 07/05/11 0400  WBC 10.2 12.9*  NEUTROABS -- 9.0*  HGB 7.5* 10.0*  HCT 22.2* 30.2*  MCV 95.3 99.0  PLT 149* 160   Cardiac Enzymes:  Basename 07/05/11 0946 07/05/11 0500 07/04/11 2239  CKTOTAL 3105* 3738* 4245*  CKMB 4.3* 5.1* 5.4*  CKMBINDEX -- -- --  TROPONINI 0.47* 0.53* <0.30    Imaging: Ct Angio Chest W/cm &/or Wo Cm  07/05/2011  *RADIOLOGY REPORT*  Clinical Data: Shortness of breath.  CT ANGIOGRAPHY CHEST  Technique:  Multidetector CT imaging of the chest using the standard protocol during bolus administration of intravenous contrast. Multiplanar reconstructed images including MIPs were obtained and reviewed to evaluate the  vascular anatomy.  Contrast: 80mL OMNIPAQUE IOHEXOL 350 MG/ML IV SOLN  Comparison: Chest x-ray 07/04/2011.  Findings: No filling defects in the pulmonary arteries to suggest pulmonary emboli.  There is cardiomegaly.  Coronary artery calcifications.  Aorta is normal caliber.  Esophagus is mildly dilated and fluid-filled. No mediastinal, hilar, or axillary adenopathy.  Small bilateral pleural effusions with compressive atelectasis in the lower lobes.  Mild vascular congestion.  No overt edema. Imaging into the upper abdomen shows no acute findings.  The superior aspect of posterior fusion changes are noted in the lower thoracic and visualized upper lumbar spine.  No acute bony abnormality.  IMPRESSION: No evidence of pulmonary embolus.  Small bilateral pleural effusions with compressive atelectasis in the lower lobes.  Cardiomegaly.  Mildly dilated esophagus which is fluid filled.  Cannot exclude reflux or dysmotility.  Original Report Authenticated By: Cyndie Chime, M.D.    Cardiac Studies:   Telemetry:  NSR rate improved around 75 compared to 100 yesterday  Echo: Impressions:  - The patient was in atrial fibrillation. Normal LV size and systolic function, EF 60-65%. Mild LV hypertrophy. The aortic valve was heavily calcified. Visually, it appeared moderate to severely stenosis. However, stenosis was in mild range  by gradient and calculated aortic valve area. I would suggest further evaluation of the aortic valve by additional TTE images to look for a higher gradient or TEE. Normal RV size and systolic function. Mild pulmonary hypertension.    Medications:      . amiodarone  150 mg Intravenous Once  . aspirin EC  81 mg Oral Daily  . chlorproMAZINE  25 mg Oral TID  . cholecalciferol  1,000 Units Oral Daily  . docusate sodium  100 mg Oral BID  . folic acid  1 mg Oral Daily  . insulin aspart  0-15 Units Subcutaneous TID WC  . insulin aspart  0-5 Units Subcutaneous QHS  . insulin aspart   4 Units Subcutaneous TID WC  . metoprolol tartrate  25 mg Oral Q6H  . omega-3 acid ethyl esters  1 g Oral Daily  . pantoprazole  40 mg Oral Q1200  . simvastatin  5 mg Oral q1800  . DISCONTD: insulin aspart  0-15 Units Subcutaneous Q4H  . DISCONTD: metoprolol tartrate  25 mg Oral BID  . DISCONTD: morphine   Intravenous Q4H        . amiodarone (NEXTERONE PREMIX) 360 mg/200 mL dextrose 1 mg/min (07/06/11 0939)   And  . amiodarone (NEXTERONE PREMIX) 360 mg/200 mL dextrose 0.501 mg/min (07/07/11 0700)  . lactated ringers 20 mL/hr at 07/05/11 0829    Assessment/Plan:  PAF:  Increase lopresser to 25 q6 try to decrease adrenergic tone.  Keep Hct above 30 and control pain. Continue amiodarone iv for 24 more hours then           D/C  as there is risk for recurrence.  CAD:  Enzymes elevated from back surgery No evidence of acute coronary syndrome SEM:  Mild AS by echo 1/23  Normal EF 65% Anemia:  Recheck accuracy  Transfuse at least one unit if accurate.  Discussed with Dr Maryla Morrow [facility] 07/07/2011, 7:56 AM

## 2011-07-08 LAB — URINE CULTURE
Colony Count: NO GROWTH
Culture  Setup Time: 201301251230
Culture: NO GROWTH

## 2011-07-08 LAB — CBC
Hemoglobin: 8.9 g/dL — ABNORMAL LOW (ref 13.0–17.0)
MCH: 31.3 pg (ref 26.0–34.0)
MCHC: 34.4 g/dL (ref 30.0–36.0)
Platelets: 160 10*3/uL (ref 150–400)
RDW: 14.4 % (ref 11.5–15.5)

## 2011-07-08 LAB — CARDIAC PANEL(CRET KIN+CKTOT+MB+TROPI)
CK, MB: 2.2 ng/mL (ref 0.3–4.0)
Relative Index: 0.4 (ref 0.0–2.5)
Troponin I: <0.3 ng/mL (ref <0.30)

## 2011-07-08 LAB — TYPE AND SCREEN

## 2011-07-08 LAB — GLUCOSE, CAPILLARY: Glucose-Capillary: 141 mg/dL — ABNORMAL HIGH (ref 70–99)

## 2011-07-08 MED ORDER — WHITE PETROLATUM GEL
Status: AC
  Administered 2011-07-08: 07:00:00
  Filled 2011-07-08: qty 5

## 2011-07-08 NOTE — Progress Notes (Signed)
Patient ID: Curtis Frazier, male   DOB: 20-Mar-1941, 71 y.o.   MRN: [id number] Subjective:  Patient is alert. He has no specific complaints. He refused his blood drawn this morning.  Objective: Vital signs in last 24 hours: Temp:  [97.6 F (36.4 C)-99.5 F (37.5 C)] 99.2 F (37.3 C) (01/26 0700) Pulse Rate:  [64-95] 95  (01/26 0600) Resp:  [14-24] 23  (01/26 0600) BP: (85-146)/(39-72) 128/72 mmHg (01/26 0600) SpO2:  [85 %-97 %] 89 % (01/26 0600)  Intake/Output from previous day: 01/25 0701 - 01/26 0700 In: 1395.1 [I.V.:1032.6; Blood:362.5] Out: 2725 [Urine:2725] Intake/Output this shift:    Physical exam the patient is alert and oriented. He is moving all 4 extremities well.  Lab Results:  Basename 07/07/11 0835 07/07/11 0400  WBC 10.5 10.2  HGB 7.8* 7.5*  HCT 22.6* 22.2*  PLT 151 149*   BMET  Basename 07/07/11 0400 07/05/11 0953  NA 134* 134*  K 4.3 5.1  CL 100 101  CO2 27 28  GLUCOSE 160* 193*  BUN 31* 30*  CREATININE 1.42* 1.89*  CALCIUM 8.2* 7.7*    Studies/Results: Dg Chest Port 1 View  07/07/2011  *RADIOLOGY REPORT*  Clinical Data: Fever.  PORTABLE CHEST - 1 VIEW  Comparison: 06/23/2011  Findings: Bilateral lower lobe airspace opacities are noted, new since prior study.  There are low lung volumes and mild cardiomegaly.  Vascular congestion.  Possible small effusions.  IMPRESSION: Cardiomegaly with bilateral lower lobe airspace opacities, edema versus infection.  Suspect small bilateral effusions.  Original Report Authenticated By: Cyndie Chime, M.D.    Assessment/Plan: Postop day #5: We will continue to try to mobilize him with PT and OT.  Atrial fibrillation: This has resolved. Hopefully we can stop the amiodarone drip today and transfer him to the floor.  Fever: Chest x-ray shows some atelectasis. His white count is normal. Urinalysis and culture is negative so far. He has been afebrile the last 24 hours.  Renal sufficiency: We'll recheck his  BUN/creatinine in a couple days.  Acute blood loss anemia: We'll recheck his CBC this morning.  LOS: 5 days     Curtis Frazier D 07/08/2011, 7:39 AM

## 2011-07-08 NOTE — Progress Notes (Signed)
Pt refused lab collection for CBC at 0500.

## 2011-07-08 NOTE — Progress Notes (Signed)
Patient ID: Curtis Frazier, male   DOB: 1940/07/05, 71 y.o.   MRN: [id number] SUBJECTIVE:  The rhythm remains stable. Telemetry reveals normal sinus rhythm   Filed Vitals:   07/08/11 0800 07/08/11 0900 07/08/11 1000 07/08/11 1100  BP: 136/59 110/47 96/43 101/48  Pulse: 86 80 70 68  Temp:      TempSrc:      Resp: 24 15 16 16   Height:      Weight:      SpO2: 93% 93% 95% 95%    Intake/Output Summary (Last 24 hours) at 07/08/11 1155 Last data filed at 07/08/11 1100  Gross per 24 hour  Intake 1402.6 ml  Output   3075 ml  Net -1672.4 ml    LABS: Basic Metabolic Panel:  Basename 07/07/11 0400  NA 134*  K 4.3  CL 100  CO2 27  GLUCOSE 160*  BUN 31*  CREATININE 1.42*  CALCIUM 8.2*  MG --  PHOS --   Liver Function Tests: No results found for this basename: AST:2,ALT:2,ALKPHOS:2,BILITOT:2,PROT:2,ALBUMIN:2 in the last 72 hours No results found for this basename: LIPASE:2,AMYLASE:2 in the last 72 hours CBC:  Basename 07/08/11 0930 07/07/11 0835  WBC 8.4 10.5  NEUTROABS -- 8.0*  HGB 8.9* 7.8*  HCT 25.9* 22.6*  MCV 91.2 94.2  PLT 160 151   Cardiac Enzymes:  Basename 07/08/11 0920 07/08/11 0206 07/07/11 1939  CKTOTAL 596* 642* 775*  CKMB 2.2 2.0 2.2  CKMBINDEX -- -- --  TROPONINI <0.30 <0.30 <0.30   BNP: No components found with this basename: POCBNP:3 D-Dimer: No results found for this basename: DDIMER:2 in the last 72 hours Hemoglobin A1C: No results found for this basename: HGBA1C in the last 72 hours Fasting Lipid Panel: No results found for this basename: CHOL,HDL,LDLCALC,TRIG,CHOLHDL,LDLDIRECT in the last 72 hours Thyroid Function Tests: No results found for this basename: TSH,T4TOTAL,FREET3,T3FREE,THYROIDAB in the last 72 hours  RADIOLOGY: Dg Chest 2 View  06/23/2011  *RADIOLOGY REPORT*  Clinical Data: Preoperative evaluation.  CHEST - 2 VIEW  Comparison: 08/30/2010.  Findings: Cardiac silhouette is borderline size.  Tortuosity thoracic aorta is seen.  There  is scoliosis convexity to the right. There is slight flattening of the diaphragm on lateral image with mild hyperinflation configuration.  No pulmonary edema, pneumonia, or pleural effusion is seen.  There is osteopenic appearance of the bones.  Changes of degenerative disc disease and degenerative spondylosis are seen.  Compression fractures of the thoracic spine appear stable.  IMPRESSION: Slight hyperinflation configuration.  Borderline heart size with no evidence of pulmonary edema, pneumonia, or other acute cardiopulmonary or pleural abnormality.  Stable chronic findings detailed above.  Original Report Authenticated By: Crawford Givens, M.D.   Ct Angio Chest W/cm &/or Wo Cm  07/05/2011  *RADIOLOGY REPORT*  Clinical Data: Shortness of breath.  CT ANGIOGRAPHY CHEST  Technique:  Multidetector CT imaging of the chest using the standard protocol during bolus administration of intravenous contrast. Multiplanar reconstructed images including MIPs were obtained and reviewed to evaluate the vascular anatomy.  Contrast: 80mL OMNIPAQUE IOHEXOL 350 MG/ML IV SOLN  Comparison: Chest x-ray 07/04/2011.  Findings: No filling defects in the pulmonary arteries to suggest pulmonary emboli.  There is cardiomegaly.  Coronary artery calcifications.  Aorta is normal caliber.  Esophagus is mildly dilated and fluid-filled. No mediastinal, hilar, or axillary adenopathy.  Small bilateral pleural effusions with compressive atelectasis in the lower lobes.  Mild vascular congestion.  No overt edema. Imaging into the upper abdomen shows no acute findings.  The superior aspect of posterior fusion changes are noted in the lower thoracic and visualized upper lumbar spine.  No acute bony abnormality.  IMPRESSION: No evidence of pulmonary embolus.  Small bilateral pleural effusions with compressive atelectasis in the lower lobes.  Cardiomegaly.  Mildly dilated esophagus which is fluid filled.  Cannot exclude reflux or dysmotility.  Original Report  Authenticated By: Cyndie Chime, M.D.   Dg Lumbar Spine 1 View  07/03/2011  *RADIOLOGY REPORT*  Clinical Data: Thoracolumbar scoliosis.  LUMBAR SPINE - 1 VIEW  Comparison: MRI dated 08/06/2010  Findings: Radiograph #1 demonstrates an instrument at the level of L3-4.  IMPRESSION: Instrument at L3-4.  Original Report Authenticated By: Gwynn Burly, M.D.   Dg Chest Port 1 View  07/07/2011  *RADIOLOGY REPORT*  Clinical Data: Fever.  PORTABLE CHEST - 1 VIEW  Comparison: 06/23/2011  Findings: Bilateral lower lobe airspace opacities are noted, new since prior study.  There are low lung volumes and mild cardiomegaly.  Vascular congestion.  Possible small effusions.  IMPRESSION: Cardiomegaly with bilateral lower lobe airspace opacities, edema versus infection.  Suspect small bilateral effusions.  Original Report Authenticated By: Cyndie Chime, M.D.   Dg Chest Port 1 View  07/04/2011  *RADIOLOGY REPORT*  Clinical Data: Atrial fibrillation; hypoxemia.  PORTABLE CHEST - 1 VIEW  Comparison: Chest radiograph performed 06/23/2011  Findings: The lungs are well-aerated.  Vascular congestion is noted, without significant pulmonary edema.  Mild bilateral atelectasis is seen.  There is no evidence of pleural effusion or pneumothorax.  The cardiomediastinal silhouette is borderline normal in size.  No acute osseous abnormalities are seen.  Thoracolumbar spinal fusion rods are partially imaged.  IMPRESSION: Vascular congestion, without significant pulmonary edema; mild bilateral atelectasis seen.  Original Report Authenticated By: Tonia Ghent, M.D.   Dg C-arm Gt 120 Min-no Report  07/03/2011  CLINICAL DATA: T10-L5 Fusion   C-ARM GT 120 MINUTE  Fluoroscopy was utilized by the requesting physician.  No radiographic  interpretation.      PHYSICAL EXAM Limited exam reveals S1 and S2. There is a soft systolic murmur.   TELEMETRY: I have reviewed telemetry. There is normal sinus rhythm.   ASSESSMENT AND  PLAN:  Principal Problem:   *New onset a-fib Holding normal sinus rhythm.  The plan outlined yesterday was to stop IV amiodarone. This will be stopped. We will follow his rhythm.  Active Problems:  ANEMIA-UNSPECIFIED  C A D  Aortic stenosis   Willa Rough 07/08/2011 11:55 AM

## 2011-07-08 NOTE — Progress Notes (Signed)
Physical Therapy Treatment Patient Details Name: Curtis Frazier MRN: [id number] DOB: 07/07/1940 Today's Date: 07/08/2011  PT Assessment/Plan  PT - Assessment/Plan Comments on Treatment Session: Pt willing to get OOB with motivation but continues to be limited due to pain.  Pt easily agitiated and wants to control PT sesssion.   PT Plan: Frequency remains appropriate;Discharge plan needs to be updated PT Frequency: Min 6X/week Follow Up Recommendations: Inpatient Rehab Equipment Recommended: Defer to next venue PT Goals  Acute Rehab PT Goals PT Goal Formulation: With patient Time For Goal Achievement: 2 weeks Pt will Roll Supine to Right Side: with modified independence PT Goal: Rolling Supine to Right Side - Progress: Progressing toward goal Pt will Roll Supine to Left Side: with modified independence PT Goal: Rolling Supine to Left Side - Progress: Progressing toward goal Pt will go Supine/Side to Sit: with modified independence PT Goal: Supine/Side to Sit - Progress: Progressing toward goal Pt will go Stand to Sit: with supervision PT Goal: Stand to Sit - Progress: Progressing toward goal Pt will Ambulate: 51 - 150 feet;with supervision;with least restrictive assistive device PT Goal: Ambulate - Progress: Progressing toward goal  PT Treatment Precautions/Restrictions  Precautions Precautions: Back Precaution Comments: Re-educated in back precautions Required Braces or Orthoses: Yes Spinal Brace: Lumbar corset;Applied in sitting position Restrictions Weight Bearing Restrictions: No Mobility (including Balance) Bed Mobility Bed Mobility: Yes Rolling Right: 3: Mod assist;With rail Rolling Right Details (indicate cue type and reason): VCs for hand placement and proper technique to prevent twisting. (A) to initiate and complete roll. Right Sidelying to Sit: 3: Mod assist;With rails;HOB flat Right Sidelying to Sit Details (indicate cue type and reason): (A) with trunk OOB with max  cues for technique. Transfers Transfers: Yes Sit to Stand: 4: Min assist;From elevated surface;From bed Sit to Stand Details (indicate cue type and reason): (A) to initiate transfer with max cues for hand placement.  Pt tends to keep hands on RW. Stand to Sit: 4: Min assist;To chair/3-in-1;With armrests Stand to Sit Details: (A) to slowly descend to chair with cues for hand and LE placement. Ambulation/Gait Ambulation/Gait: Yes Ambulation/Gait Assistance: 4: Min assist Ambulation/Gait Assistance Details (indicate cue type and reason): (A) to maintain balance with cues for body position within RW.   Ambulation Distance (Feet): 5 Feet Assistive device: Rolling walker Gait Pattern: Step-to pattern;Shuffle Stairs: No Corporate treasurer: No  Balance Balance Assessed: Yes Cabin crew Sitting - Balance Support: Bilateral upper extremity supported;Feet supported Static Sitting - Level of Assistance: 5: Stand by assistance Static Sitting - Comment/# of Minutes: ~ 5 minutes to donn brace with total (A) Exercise    End of Session PT - End of Session Equipment Utilized During Treatment: Back brace;Gait belt Activity Tolerance: Patient limited by pain;Patient limited by fatigue Patient left: in chair;with call bell in reach Nurse Communication: Mobility status for transfers General Behavior During Session: [facility] for tasks performed Cognition: [facility] for tasks performed  [name] 07/08/2011, 1:00 PM [phone]

## 2011-07-09 LAB — GLUCOSE, CAPILLARY
Glucose-Capillary: 167 mg/dL — ABNORMAL HIGH (ref 70–99)
Glucose-Capillary: 140 mg/dL — ABNORMAL HIGH (ref 70–99)

## 2011-07-09 MED ORDER — METOPROLOL SUCCINATE ER 25 MG PO TB24
25.0000 mg | ORAL_TABLET | Freq: Every day | ORAL | Status: DC
  Filled 2011-07-09: qty 1

## 2011-07-09 MED ORDER — SIMVASTATIN 5 MG PO TABS: 5.0000 mg | ORAL_TABLET | Freq: Every day | ORAL | Status: DC

## 2011-07-09 MED ORDER — METHOTREXATE 2.5 MG PO TABS: 17.5000 mg | ORAL_TABLET | ORAL | Status: DC

## 2011-07-09 MED ORDER — NITROGLYCERIN 0.4 MG SL SUBL: 0.4000 mg | SUBLINGUAL_TABLET | SUBLINGUAL | Status: DC | PRN

## 2011-07-09 MED ORDER — CHOLECALCIFEROL 25 MCG (1000 UT) PO CAPS: 1000.0000 [IU] | ORAL_CAPSULE | Freq: Every day | ORAL | Status: DC

## 2011-07-09 MED ORDER — METOPROLOL SUCCINATE ER 25 MG PO TB24
25.0000 mg | ORAL_TABLET | Freq: Every day | ORAL | Status: DC
  Administered 2011-07-10 – 2011-07-11 (×2): 25 mg via ORAL
  Filled 2011-07-09 (×2): qty 1

## 2011-07-09 NOTE — Progress Notes (Signed)
Patient ID: Curtis Frazier, male   DOB: Feb 24, 1941, 71 y.o.   MRN: [id number] Subjective: Patient reports Feeling better  Objective: Vital signs in last 24 hours: Temp:  [98 F (36.7 C)-99.1 F (37.3 C)] 98 F (36.7 C) (01/27 0352) Pulse Rate:  [68-98] 73  (01/27 0800) Resp:  [14-27] 20  (01/27 0800) BP: (96-154)/(39-71) 111/57 mmHg (01/27 0700) SpO2:  [89 %-98 %] 98 % (01/27 0800) Weight:  [90.1 kg (198 lb 10.2 oz)] 90.1 kg (198 lb 10.2 oz) (01/27 0600)  Intake/Output from previous day: 01/26 0701 - 01/27 0700 In: 559.3 [P.O.:320; I.V.:239.3] Out: 1555 [Urine:1555] Intake/Output this shift:    No new neuro issues  Lab Results:  Basename 07/08/11 0930 07/07/11 0835  WBC 8.4 10.5  HGB 8.9* 7.8*  HCT 25.9* 22.6*  PLT 160 151   BMET  Basename 07/07/11 0400  NA 134*  K 4.3  CL 100  CO2 27  GLUCOSE 160*  BUN 31*  CREATININE 1.42*  CALCIUM 8.2*    Studies/Results: Dg Chest Port 1 View  07/07/2011  *RADIOLOGY REPORT*  Clinical Data: Fever.  PORTABLE CHEST - 1 VIEW  Comparison: 06/23/2011  Findings: Bilateral lower lobe airspace opacities are noted, new since prior study.  There are low lung volumes and mild cardiomegaly.  Vascular congestion.  Possible small effusions.  IMPRESSION: Cardiomegaly with bilateral lower lobe airspace opacities, edema versus infection.  Suspect small bilateral effusions.  Original Report Authenticated By: Cyndie Chime, M.D.    Assessment/Plan: Doing better. Will transfer to floor now that drip is off.  LOS: 6 days  As above   Reinaldo Meeker, MD 07/09/2011, 8:43 AM

## 2011-07-09 NOTE — Progress Notes (Signed)
Patient ID: Curtis Frazier, male   DOB: 26-Sep-1940, 71 y.o.   MRN: [id number] SUBJECTIVE:  Patient is sitting in the chair feeling well. He has been transferred to the regular floor   Filed Vitals:   07/09/11 0700 07/09/11 0800 07/09/11 0900 07/09/11 1111  BP: 111/57  122/49 144/70  Pulse: 69 73 73 90  Temp:  98.5 F (36.9 C)  98.3 F (36.8 C)  TempSrc:  Oral  Oral  Resp: 18 20 18 20   Height:      Weight:      SpO2: 93% 98% 96% 97%    Intake/Output Summary (Last 24 hours) at 07/09/11 1202 Last data filed at 07/09/11 0600  Gross per 24 hour  Intake    380 ml  Output   1205 ml  Net   -825 ml    LABS: Basic Metabolic Panel:  Basename 07/07/11 0400  NA 134*  K 4.3  CL 100  CO2 27  GLUCOSE 160*  BUN 31*  CREATININE 1.42*  CALCIUM 8.2*  MG --  PHOS --   Liver Function Tests: No results found for this basename: AST:2,ALT:2,ALKPHOS:2,BILITOT:2,PROT:2,ALBUMIN:2 in the last 72 hours No results found for this basename: LIPASE:2,AMYLASE:2 in the last 72 hours CBC:  Basename 07/08/11 0930 07/07/11 0835  WBC 8.4 10.5  NEUTROABS -- 8.0*  HGB 8.9* 7.8*  HCT 25.9* 22.6*  MCV 91.2 94.2  PLT 160 151   Cardiac Enzymes:  Basename 07/08/11 0920 07/08/11 0206 07/07/11 1939  CKTOTAL 596* 642* 775*  CKMB 2.2 2.0 2.2  CKMBINDEX -- -- --  TROPONINI <0.30 <0.30 <0.30     Principal Problem:   *New onset a-fib  Patient converted to sinus rhythm and is holding sinus. No change in therapy.    Active Problems:  ANEMIA-UNSPECIFIED  C A D  Aortic stenosis   Willa Rough 07/09/2011 12:02 PM

## 2011-07-10 DIAGNOSIS — M4120 Other idiopathic scoliosis, site unspecified: Secondary | ICD-10-CM

## 2011-07-10 DIAGNOSIS — IMO0002 Reserved for concepts with insufficient information to code with codable children: Secondary | ICD-10-CM

## 2011-07-10 LAB — GLUCOSE, CAPILLARY
Glucose-Capillary: 149 mg/dL — ABNORMAL HIGH (ref 70–99)
Glucose-Capillary: 134 mg/dL — ABNORMAL HIGH (ref 70–99)
Glucose-Capillary: 188 mg/dL — ABNORMAL HIGH (ref 70–99)

## 2011-07-10 NOTE — Progress Notes (Signed)
Physical Therapy Treatment Patient Details Name: Curtis Frazier MRN: [id number] DOB: 1941/02/25 Today's Date: 07/10/2011  PT Assessment/Plan  PT - Assessment/Plan Comments on Treatment Session: Pt making good progress however continues to need assist and demonstrates decreased safety with basic transfers such as sit to stand during which he has to "jump" his hand to his RW from  the support surface and is unable to fully extend knees until hands are on RW.  Pt also with very limited endurance and continues with shortness of breath although is on 4L [place] O2. Recommend short stay at CIR to promote safe return home.  PT Plan: Frequency remains appropriate;Discharge plan needs to be updated PT Frequency: Min 6X/week Follow Up Recommendations: Inpatient Rehab Equipment Recommended: Defer to next venue PT Goals  Acute Rehab PT Goals Pt will Roll Supine to Right Side: with modified independence PT Goal: Rolling Supine to Right Side - Progress: Progressing toward goal Pt will Roll Supine to Left Side: with modified independence Pt will go Supine/Side to Sit: with modified independence Pt will go Stand to Sit: with supervision Pt will Ambulate: 51 - 150 feet;with supervision;with least restrictive assistive device PT Goal: Ambulate - Progress: Progressing toward goal  PT Treatment Precautions/Restrictions  Precautions Precautions: Back Precaution Comments: With verbal cues pt able to recall 3/3 back precautions.  Required Braces or Orthoses: Yes Spinal Brace: Lumbar corset;Applied in sitting position Restrictions Weight Bearing Restrictions: No Mobility (including Balance) Bed Mobility Bed Mobility: Yes Rolling Right: With rail;4: Min assist Rolling Right Details (indicate cue type and reason): Verbal cues for log rolling, bending knees prior to roll (pt initiating with trunk rotation).  Right Sidelying to Sit: With rails;HOB flat;4: Min assist Right Sidelying to Sit Details (indicate cue type  and reason): (A) to upright trunk, (A) required for bil. LE advancement and positioning. Verbal cues for precautions Transfers Transfers: Yes Sit to Stand: 4: Min assist;From elevated surface;From bed Stand to Sit: 4: Min assist;To chair/3-in-1;With armrests Stand to Sit Details: Heavy min assist from bed, less assist required from chair with arm rests. Pt has most difficulty getting UEs from support surface to RW and has to "jump" hands to RW demonstrating weakness and unsafe ability to transfer.  Ambulation/Gait Ambulation/Gait: Yes Ambulation/Gait Assistance: 4: Min assist Ambulation/Gait Assistance Details (indicate cue type and reason): Assist to promote improved balance and negotiate RW in room. Verbal cues for safe distancing of RW, pt tends to get RW too far ahead of self.  Ambulation Distance (Feet): 50 Feet (Total: 25 then 25) Assistive device: Rolling walker Gait Pattern: Shuffle;Step-to pattern;Step-through pattern;Decreased stride length Gait velocity: Pt with frequent stops during ambulation Stairs: No Wheelchair Mobility Wheelchair Mobility: No    Exercise  General Exercises - Lower Extremity Long Arc Quad: AROM;Both;15 reps;Seated Hip Flexion/Marching: AROM;Both;10 reps;Seated Toe Raises: AROM;Both;Seated;20 reps Heel Raises: AROM;Both;Other reps (comment);Seated (20 reps) End of Session PT - End of Session Equipment Utilized During Treatment: Back brace;Gait belt Activity Tolerance: Patient limited by fatigue;Patient tolerated treatment well Patient left: in chair;with call bell in reach;with family/visitor present Nurse Communication: Mobility status for ambulation General Behavior During Session: [facility] for tasks performed Cognition: [facility] for tasks performed  Sherie Don 07/10/2011, 12:58 PM  Sherie Don) Carleene Mains PT, DPT Acute Rehabilitation [phone]

## 2011-07-10 NOTE — Progress Notes (Signed)
Occupational Therapy Treatment Patient Details Name: Curtis Frazier MRN: [id number] DOB: 01-23-1941 Today's Date: 07/10/2011  OT Assessment/Plan OT Assessment/Plan Comments on Treatment Session: noted SOB and requested his [place] o2 be placed back on OT Plan: Other (comment) (con't. to rec. CIR) OT Frequency: Min 2X/week Follow Up Recommendations: Inpatient Rehab Equipment Recommended: Defer to next venue OT Goals Acute Rehab OT Goals Time For Goal Achievement: 7 days ADL Goals ADL Goal: Grooming - Progress: Progressing toward goals ADL Goal: Lower Body Dressing - Progress: Progressing toward goals ADL Goal: Toilet Transfer - Progress: Progressing toward goals ADL Goal: Toileting - Clothing Manipulation - Progress: Progressing toward goals ADL Goal: Toileting - Hygiene - Progress: Progressing toward goals Miscellaneous OT Goals OT Goal: Miscellaneous Goal #1 - Progress: Progressing toward goals  OT Treatment Precautions/Restrictions  Precautions Precautions: Back Precaution Comments: With verbal cues pt able to recall 3/3 back precautions.  Required Braces or Orthoses: Yes Spinal Brace: Lumbar corset;Applied in sitting position   ADL ADL Grooming: Performed;Wash/dry hands;Minimal assistance Grooming Details (indicate cue type and reason): inst. and demonstrational cues not to twist while standing at sink, inst. pt. to push walker up to the sink, and stand inside of it Where Assessed - Grooming: Standing at sink Toilet Transfer: Performed;Minimal assistance Toilet Transfer Method: Proofreader: Raised toilet seat with arms (or 3-in-1 over toilet) Toileting - Clothing Manipulation: Performed;Minimal assistance Where Assessed - Toileting Clothing Manipulation: Sit to stand from 3-in-1 or toilet Toileting - Hygiene: Performed;Minimal assistance Toileting - Hygiene Details (indicate cue type and reason): cues for maintaining back precautions Where Assessed -  Toileting Hygiene: Sit to stand from 3-in-1 or toilet Equipment Used: Rolling walker Ambulation Related to ADLs: cues for walker safety and maintaining back precautions ADL Comments: reviewed with pt. and family methods for toilet hygiene while maintaining back precautions Mobility  Bed Mobility Bed Mobility: No Rolling Right: With rail;4: Min assist Rolling Right Details (indicate cue type and reason): Verbal cues for log rolling, bending knees prior to roll (pt initiating with trunk rotation).  Right Sidelying to Sit: With rails;HOB flat;4: Min assist Right Sidelying to Sit Details (indicate cue type and reason): (A) to upright trunk, (A) required for bil. LE advancement and positioning. Verbal cues for precautions Transfers Transfers: Yes Sit to Stand: 4: Min assist;From chair/3-in-1 Stand to Sit: 4: Min assist;With upper extremity assist;To chair/3-in-1 Stand to Sit Details: Heavy min assist from bed, less assist required from chair with arm rests. Pt has most difficulty getting UEs from support surface to RW and has to "jump" hands to RW demonstrating weakness and unsafe ability to transfer.  Exercises General Exercises - Lower Extremity Long Arc Quad: AROM;Both;15 reps;Seated Hip Flexion/Marching: AROM;Both;10 reps;Seated Toe Raises: AROM;Both;Seated;20 reps Heel Raises: AROM;Both;Other reps (comment);Seated (20 reps)  End of Session OT - End of Session Activity Tolerance: Patient tolerated treatment well Patient left: in chair;with call bell in reach;with family/visitor present General Behavior During Session: [facility] for tasks performed Cognition: [facility] for tasks performed  Robet Leu COTA/L 07/10/2011, 2:19 PM

## 2011-07-10 NOTE — Progress Notes (Signed)
Patient ID: Curtis Frazier, male   DOB: 08-05-1940, 71 y.o.   MRN: [id number] SUBJECTIVE:  The rhythm remains stable.  Nausea S/P transfusion   Filed Vitals:   07/09/11 1111 07/09/11 1500 07/09/11 2020 07/10/11 0554  BP: 144/70 144/69 141/61 143/68  Pulse: 90 87 74 70  Temp: 98.3 F (36.8 C) 98.4 F (36.9 C) 97.9 F (36.6 C) 98.2 F (36.8 C)  TempSrc: Oral Oral    Resp: 20 20 20 20   Height:      Weight:      SpO2: 97% 97% 92% 95%   No intake or output data in the 24 hours ending 07/10/11 0745  LABS: Basic Metabolic Panel: No results found for this basename: NA:2,K:2,CL:2,CO2:2,GLUCOSE:2,BUN:2,CREATININE:2,CALCIUM:2,MG:2,PHOS:2 in the last 72 hours Liver Function Tests: No results found for this basename: AST:2,ALT:2,ALKPHOS:2,BILITOT:2,PROT:2,ALBUMIN:2 in the last 72 hours No results found for this basename: LIPASE:2,AMYLASE:2 in the last 72 hours CBC:  Basename 07/08/11 0930 07/07/11 0835  WBC 8.4 10.5  NEUTROABS -- 8.0*  HGB 8.9* 7.8*  HCT 25.9* 22.6*  MCV 91.2 94.2  PLT 160 151   Cardiac Enzymes:  Basename 07/08/11 0920 07/08/11 0206 07/07/11 1939  CKTOTAL 596* 642* 775*  CKMB 2.2 2.0 2.2  CKMBINDEX -- -- --  TROPONINI <0.30 <0.30 <0.30   BNP: No components found with this basename: POCBNP:3 D-Dimer: No results found for this basename: DDIMER:2 in the last 72 hours Hemoglobin A1C: No results found for this basename: HGBA1C in the last 72 hours Fasting Lipid Panel: No results found for this basename: CHOL,HDL,LDLCALC,TRIG,CHOLHDL,LDLDIRECT in the last 72 hours Thyroid Function Tests: No results found for this basename: TSH,T4TOTAL,FREET3,T3FREE,THYROIDAB in the last 72 hours  RADIOLOGY: Dg Chest 2 View  06/23/2011  *RADIOLOGY REPORT*  Clinical Data: Preoperative evaluation.  CHEST - 2 VIEW  Comparison: 08/30/2010.  Findings: Cardiac silhouette is borderline size.  Tortuosity thoracic aorta is seen.  There is scoliosis convexity to the right. There is slight  flattening of the diaphragm on lateral image with mild hyperinflation configuration.  No pulmonary edema, pneumonia, or pleural effusion is seen.  There is osteopenic appearance of the bones.  Changes of degenerative disc disease and degenerative spondylosis are seen.  Compression fractures of the thoracic spine appear stable.  IMPRESSION: Slight hyperinflation configuration.  Borderline heart size with no evidence of pulmonary edema, pneumonia, or other acute cardiopulmonary or pleural abnormality.  Stable chronic findings detailed above.  Original Report Authenticated By: Crawford Givens, M.D.   Ct Angio Chest W/cm &/or Wo Cm  07/05/2011  *RADIOLOGY REPORT*  Clinical Data: Shortness of breath.  CT ANGIOGRAPHY CHEST  Technique:  Multidetector CT imaging of the chest using the standard protocol during bolus administration of intravenous contrast. Multiplanar reconstructed images including MIPs were obtained and reviewed to evaluate the vascular anatomy.  Contrast: 80mL OMNIPAQUE IOHEXOL 350 MG/ML IV SOLN  Comparison: Chest x-ray 07/04/2011.  Findings: No filling defects in the pulmonary arteries to suggest pulmonary emboli.  There is cardiomegaly.  Coronary artery calcifications.  Aorta is normal caliber.  Esophagus is mildly dilated and fluid-filled. No mediastinal, hilar, or axillary adenopathy.  Small bilateral pleural effusions with compressive atelectasis in the lower lobes.  Mild vascular congestion.  No overt edema. Imaging into the upper abdomen shows no acute findings.  The superior aspect of posterior fusion changes are noted in the lower thoracic and visualized upper lumbar spine.  No acute bony abnormality.  IMPRESSION: No evidence of pulmonary embolus.  Small bilateral pleural effusions with compressive  atelectasis in the lower lobes.  Cardiomegaly.  Mildly dilated esophagus which is fluid filled.  Cannot exclude reflux or dysmotility.  Original Report Authenticated By: Cyndie Chime, M.D.   Dg Lumbar  Spine 1 View  07/03/2011  *RADIOLOGY REPORT*  Clinical Data: Thoracolumbar scoliosis.  LUMBAR SPINE - 1 VIEW  Comparison: MRI dated 08/06/2010  Findings: Radiograph #1 demonstrates an instrument at the level of L3-4.  IMPRESSION: Instrument at L3-4.  Original Report Authenticated By: Gwynn Burly, M.D.   Dg Chest Port 1 View  07/07/2011  *RADIOLOGY REPORT*  Clinical Data: Fever.  PORTABLE CHEST - 1 VIEW  Comparison: 06/23/2011  Findings: Bilateral lower lobe airspace opacities are noted, new since prior study.  There are low lung volumes and mild cardiomegaly.  Vascular congestion.  Possible small effusions.  IMPRESSION: Cardiomegaly with bilateral lower lobe airspace opacities, edema versus infection.  Suspect small bilateral effusions.  Original Report Authenticated By: Cyndie Chime, M.D.   Dg Chest Port 1 View  07/04/2011  *RADIOLOGY REPORT*  Clinical Data: Atrial fibrillation; hypoxemia.  PORTABLE CHEST - 1 VIEW  Comparison: Chest radiograph performed 06/23/2011  Findings: The lungs are well-aerated.  Vascular congestion is noted, without significant pulmonary edema.  Mild bilateral atelectasis is seen.  There is no evidence of pleural effusion or pneumothorax.  The cardiomediastinal silhouette is borderline normal in size.  No acute osseous abnormalities are seen.  Thoracolumbar spinal fusion rods are partially imaged.  IMPRESSION: Vascular congestion, without significant pulmonary edema; mild bilateral atelectasis seen.  Original Report Authenticated By: Tonia Ghent, M.D.   Dg C-arm Gt 120 Min-no Report  07/03/2011  CLINICAL DATA: T10-L5 Fusion   C-ARM GT 120 MINUTE  Fluoroscopy was utilized by the requesting physician.  No radiographic  interpretation.      PHYSICAL EXAM   Affect appropriate Healthy:  appears stated age HEENT: normal Neck supple with no adenopathy JVP normal no bruits no thyromegaly Lungs clear with no wheezing and good diaphragmatic motion Heart:  S1/S2 SEM  murmur, no rub, gallop or click PMI normal Abdomen:tympanitic nontender BS positve,  no AAA no bruit.  No HSM or HJR Distal pulses intact with no bruits No edema Neuro non-focal S/P back surgery Skin warm and dry No muscular weakness    ASSESSMENT AND PLAN:  Principal Problem:   *New onset a-fib Holding normal sinus rhythm.  Amiodarone D/C cardiac status stable Anemia: improved s/p transfusion Nausea  Abdomen tympanitic.  Consider KUB for ileus  Active Problems:  ANEMIA-UNSPECIFIED  C A D  Aortic stenosis   Charlton Haws 07/10/2011 7:45 AM

## 2011-07-10 NOTE — Consult Note (Signed)
Physical Medicine and Rehabilitation Consult Reason for Consult: thoracolumbar scoliosis with radiculopathy Referring Phsyician: Dr. Dario Ave is an 71 y.o. male.   HPI: 71 year old right-handed male with history of rheumatoid arthritis and chronic back pain radiating lower extremities. Recent lumbar MRI and myelogram showed significant thoracic lumbar scoliosis with degenerative changes and radiculopathy. Underwent decompressive laminectomy lumbar L. 12, lumbar L2-3, lumbar 4-5 as well as posterior segmental thoracic T. 10 lumbar L5 pedicle screws and rods as well as thoracic T10-11, and thoracic T11-12, T12-L1, lumbar L1-2, 2-3, 3-4, C4-5 posterior lateral arthrodesis per Dr. Lovell Sheehan January 21. Fitted with a back brace to be  on when out of bed. Postoperative anemia 7.5 and transfused on January 25 with latest hemoglobin 8.9. New onset atrial fibrillation with followup of [facility] cardiology services. Echocardiogram with ejection fraction 65% no wall motion abnormalities. Venous Doppler studies negative for deep vein thrombosis. Initially placed on Amiodarone which is since been discontinued as patient converted to sinus rhythm and is holding sinus.. Patient remains on Toprol 25 mg daily. Cardiac panel negative. Physical therapy initiated with latest physical therapy notes January 26 ambulating minimal assist 5 feet with a rolling walker requiring minimal assist for sit to stand and stand to sit. Occupational therapy January 24 requiring moderate assist for toilet transfers and total assist to place his back brace. Physical medicine and rehabilitation was consulted to consider inpatient rehabilitation services  Review of Systems  Constitutional: Negative for diaphoresis.  Eyes: Negative for double vision.  Respiratory: Negative for cough and shortness of breath.   Cardiovascular: Negative for chest pain.  Genitourinary: Negative for dysuria.  Musculoskeletal: Positive for myalgias, back pain  and joint pain.  Neurological: Negative for dizziness and headaches.  Psychiatric/Behavioral: Negative.    Past Medical History  Diagnosis Date  . Other and unspecified hyperlipidemia   . Coronary atherosclerosis of unspecified type of vessel, native or graft   . Personal history of colonic polyps   . Actinic keratosis   . Psoriatic arthropathy   . Other psoriasis   . Scoliosis (and kyphoscoliosis), idiopathic   . GI bleed 12/10    cecam AVM  . Gastritis 12/10  . Hyperlipidemia     takes Pravastatin daily  . Hypertension     takes Metoprolol daily  . Myocardial infarction 2010  . Shortness of breath     with exertion  . Pneumonia     hx of-2008  . Headache     occasionally  . Joint pain   . Joint swelling   . Rheumatoid arthritis     takes Methotrexate 7pills weekly  . Degeneration of intervertebral disc, site unspecified   . Chronic back pain     scoliosis/stenosis/radiculopathy,degenerative disc disease  . Psoriasis   . Bruises easily   . Esophageal reflux     takes Omeprazole daily  . History of colonic polyps   . Hemorrhoids   . Kidney stone     hx of  . Blood transfusion 2010  . Type II or unspecified type diabetes mellitus without mention of complication, not stated as uncontrolled     type II;controlled by diet and exercise   Past Surgical History  Procedure Date  . Rotator cuff repair 2000    left   . Colonoscopy   . Vasectomy 1979  . Ptca stent 02/2009    has one stent  . Carotid doppler 10/12    0-39% R and 60-79% left   . Cataract surgery 2012  .  Cardiac catheterization 2010  . Lithotripsy 2009   Family History  Problem Relation Age of Onset  . Coronary artery disease Mother   . Diabetes Mother   . Heart disease Mother     CAD  . Coronary artery disease Sister   . Hypertension Sister   . Kidney failure Sister   . Heart disease Sister     CAD  . Pancreatic cancer Brother   . Diabetes Brother   . Prostate cancer Brother   . Cancer  Brother     pancreatic CA  . Diabetes Brother   . Anesthesia problems Neg Hx   . Hypotension Neg Hx   . Malignant hyperthermia Neg Hx   . Pseudochol deficiency Neg Hx    Social History:  reports that he has quit smoking. He does not have any smokeless tobacco history on file. He reports that he does not drink alcohol or use illicit drugs. Allergies:  Allergies  Allergen Reactions  . Penicillins     REACTION: rash, swelling  . Tetracycline     REACTION: rash, swelling   Medications Prior to Admission  Medication Dose Route Frequency Provider Last Rate Last Dose  . 0.9 %  sodium chloride infusion   Intravenous Once Cristi Loron, MD 500 mL/hr at 07/05/11 0013    . acetaminophen (TYLENOL) tablet 650 mg  650 mg Oral Q4H PRN Cristi Loron, MD   650 mg at 07/06/11 2004   Or  . acetaminophen (TYLENOL) suppository 650 mg  650 mg Rectal Q4H PRN Cristi Loron, MD      . alum & mag hydroxide-simeth (MAALOX/MYLANTA) 200-200-20 MG/5ML suspension 30 mL  30 mL Oral Q4H PRN Reinaldo Meeker, MD   30 mL at 07/07/11 1842  . amiodarone (NEXTERONE) 1.8 mg/mL load via infusion 150 mg  150 mg Intravenous Once Wendall Stade, MD 500 mL/hr at 07/06/11 0940 150 mg at 07/06/11 0940   And  . amiodarone (NEXTERONE PREMIX) 360 mg/200 mL dextrose IV infusion  1 mg/min Intravenous Continuous Wendall Stade, MD 33.3 mL/hr at 07/06/11 0939 1 mg/min at 07/06/11 0939  . amiodarone (NEXTERONE) 1.8 mg/mL load via infusion 150 mg  150 mg Intravenous Once Lonia Farber, MD 500 mL/hr at 07/05/11 0254 150 mg at 07/05/11 0254  . aspirin EC tablet 81 mg  81 mg Oral Daily Cristi Loron, MD   81 mg at 07/10/11 1308  . bacitracin 65784 UNITS injection           . bacitracin 69629 UNITS injection           . bacitracin 52841 UNITS injection           . bupivacaine liposome (EXPAREL) 1.3 % injection 266 mg  20 mL Infiltration Once Cristi Loron, MD   20 mL at 07/03/11 1528  . chlorproMAZINE  (THORAZINE) tablet 25 mg  25 mg Oral TID Cristi Loron, MD   25 mg at 07/08/11 1028  . cholecalciferol (VITAMIN D) tablet 1,000 Units  1,000 Units Oral Daily Cristi Loron, MD   1,000 Units at 07/10/11 [phone]  . diazepam (VALIUM) tablet 5 mg  5 mg Oral Q6H PRN Cristi Loron, MD   5 mg at 07/07/11 2208  . digoxin (LANOXIN) injection 0.125 mg  0.125 mg Intravenous Once Cristi Loron, MD   0.125 mg at 07/05/11 0738  . diltiazem (CARDIZEM) 1 mg/mL load via infusion 20 mg  20 mg Intravenous Once [employer]  Roselyn Meier, MD   20 mg at 07/05/11 0013  . docusate sodium (COLACE) capsule 100 mg  100 mg Oral BID Cristi Loron, MD   100 mg at 07/10/11 1914  . folic acid (FOLVITE) tablet 1 mg  1 mg Oral Daily Cristi Loron, MD   1 mg at 07/10/11 7829  . furosemide (LASIX) injection 40 mg  40 mg Intravenous Once Lonia Farber, MD   40 mg at 07/05/11 0305  . insulin aspart (novoLOG) injection 0-15 Units  0-15 Units Subcutaneous TID WC Billy Fischer, MD   2 Units at 07/10/11 0746  . insulin aspart (novoLOG) injection 0-5 Units  0-5 Units Subcutaneous QHS Billy Fischer, MD   3 Units at 07/08/11 2208  . insulin aspart (novoLOG) injection 4 Units  4 Units Subcutaneous TID WC Billy Fischer, MD   4 Units at 07/09/11 [phone]  . iohexol (OMNIPAQUE) 350 MG/ML injection 80 mL  80 mL Intravenous Once PRN Medication Radiologist, MD   80 mL at 07/05/11 1159  . labetalol (NORMODYNE,TRANDATE) 5 MG/ML injection        20 mg at 07/03/11 2100  . lactated ringers infusion   Intravenous Continuous Lonia Farber, MD      . methotrexate (RHEUMATREX) tablet 17.5 mg  17.5 mg Oral Weekly Reinaldo Meeker, MD      . metoprolol succinate (TOPROL-XL) 24 hr tablet 25 mg  25 mg Oral Daily Cristi Loron, MD   25 mg at 07/10/11 3086  . morphine 2 MG/ML injection 2-4 mg  2-4 mg Intravenous Q4H PRN Cristi Loron, MD   2 mg at 07/08/11 5784  . nitroGLYCERIN (NITROSTAT) SL tablet 0.4 mg  0.4 mg Sublingual Q5  Min x 3 PRN Cristi Loron, MD      . nitroGLYCERIN (NITROSTAT) SL tablet 0.4 mg  0.4 mg Sublingual Q5 min PRN Reinaldo Meeker, MD      . omega-3 acid ethyl esters (LOVAZA) capsule 1 g  1 g Oral Daily Cristi Loron, MD   1 g at 07/10/11 6962  . ondansetron (ZOFRAN) injection 4 mg  4 mg Intravenous Q4H PRN Cristi Loron, MD   4 mg at 07/04/11 0939  . oxyCODONE-acetaminophen (PERCOCET) 5-325 MG per tablet 1-2 tablet  1-2 tablet Oral Q4H PRN Cristi Loron, MD   1 tablet at 07/10/11 0751  . pantoprazole (PROTONIX) EC tablet 40 mg  40 mg Oral Q1200 Cristi Loron, MD   40 mg at 07/09/11 1424  . phenol (CHLORASEPTIC) mouth spray 1 spray  1 spray Mouth/Throat PRN Cristi Loron, MD   1 spray at 07/08/11 2324  . simvastatin (ZOCOR) tablet 5 mg  5 mg Oral q1800 Cristi Loron, MD   5 mg at 07/09/11 1740  . sodium chloride 0.9 % bolus 500 mL  500 mL Intravenous Once Kalman Shan, MD   500 mL at 07/04/11 2240  . sodium chloride 0.9 % bolus 750 mL  750 mL Intravenous Once Billy Fischer, MD   750 mL at 07/05/11 1058  . sodium chloride 0.9 % infusion           . sodium chloride 0.9 % infusion           . sodium chloride 0.9 % infusion           . vancomycin (VANCOCIN) IVPB 1000 mg/200 mL premix  1,000 mg Intravenous Q12H Cristi Loron, MD   1,000 mg at 07/03/11 2222  .  white petrolatum (VASELINE) gel           . zolpidem (AMBIEN) tablet 5 mg  5 mg Oral QHS PRN Cristi Loron, MD      . DISCONTD: 0.9 %  sodium chloride infusion   Intravenous Continuous Lonia Farber, MD      . DISCONTD: 0.9 % irrigation (POUR BTL)    PRN Cristi Loron, MD   1,000 mL at 07/03/11 0713  . DISCONTD: amiodarone (NEXTERONE PREMIX) 360 mg/200 mL dextrose IV infusion  1 mg/min Intravenous Continuous Lonia Farber, MD 33.3 mL/hr at 07/05/11 0624 1 mg/min at 07/05/11 0624  . DISCONTD: amiodarone (NEXTERONE PREMIX) 360 mg/200 mL dextrose IV infusion  0.5 mg/min Intravenous  Continuous Lonia Farber, MD      . DISCONTD: amiodarone (NEXTERONE PREMIX) 360 mg/200 mL dextrose IV infusion  0.5 mg/min Intravenous Continuous Wendall Stade, MD   0.501 mg/min at 07/08/11 0700  . DISCONTD: bacitracin 50,000 Units in sodium chloride irrigation 0.9 % 500 mL irrigation    PRN Cristi Loron, MD      . DISCONTD: bacitracin ointment    PRN Cristi Loron, MD   1 application at 07/03/11 0000  . DISCONTD: Bupivacaine-Epinephrine PF (MARCAINE W/ EPI (PF)) 0.5-1:200000 % injection    PRN Cristi Loron, MD   20 mL at 07/03/11 0700  . DISCONTD: Cholecalciferol 1,000 Units  1,000 Units Oral Daily Reinaldo Meeker, MD      . DISCONTD: diltiazem (CARDIZEM) 100 mg in dextrose 5 % 100 mL infusion  5-15 mg/hr Intravenous Continuous Kalman Shan, MD 15 mL/hr at 07/04/11 2241 15 mg/hr at 07/04/11 2241  . DISCONTD: diltiazem (CARDIZEM) injection 20 mg  20 mg Intravenous Once Cristi Loron, MD      . DISCONTD: diphenhydrAMINE (BENADRYL) 12.5 MG/5ML elixir 12.5 mg  12.5 mg Oral Q6H PRN Cristi Loron, MD      . DISCONTD: diphenhydrAMINE (BENADRYL) 12.5 MG/5ML elixir 12.5 mg  12.5 mg Oral Q6H PRN Cristi Loron, MD      . DISCONTD: diphenhydrAMINE (BENADRYL) injection 12.5 mg  12.5 mg Intravenous Q6H PRN Cristi Loron, MD      . DISCONTD: diphenhydrAMINE (BENADRYL) injection 12.5 mg  12.5 mg Intravenous Q6H PRN Cristi Loron, MD      . DISCONTD: esmolol (BREVIBLOC)  2000 mg / 100 mL infusion  25-300 mcg/kg/min Intravenous Titrated Cristi Loron, MD 7.8 mL/hr at 07/05/11 1400 30 mcg/kg/min at 07/05/11 1400  . DISCONTD: hemostatic agents    PRN Cristi Loron, MD   1 application at 07/03/11 1321  . DISCONTD: HYDROmorphone (DILAUDID) injection 0.25-0.5 mg  0.25-0.5 mg Intravenous Q5 min PRN Rivka Barbara, MD   0.5 mg at 07/03/11 1852  . DISCONTD: insulin aspart (novoLOG) injection 0-15 Units  0-15 Units Subcutaneous Q4H Cristi Loron, MD   2 Units  at 07/06/11 0902  . DISCONTD: labetalol (NORMODYNE,TRANDATE) injection 5-20 mg  5-20 mg Intravenous Q2H PRN Temple Pacini, MD      . DISCONTD: lactated ringers infusion   Intravenous Continuous Cristi Loron, MD 20 mL/hr at 07/05/11 0300    . DISCONTD: menthol-cetylpyridinium (CEPACOL) lozenge 3 mg  1 lozenge Oral PRN Cristi Loron, MD      . DISCONTD: metoprolol succinate (TOPROL-XL) 24 hr tablet 25 mg  25 mg Oral Daily Cristi Loron, MD      . DISCONTD: metoprolol tartrate (LOPRESSOR) tablet 25 mg  25  mg Oral BID Billy Fischer, MD   25 mg at 07/05/11 1708  . DISCONTD: metoprolol tartrate (LOPRESSOR) tablet 25 mg  25 mg Oral Q6H Wendall Stade, MD   25 mg at 07/09/11 1552  . DISCONTD: morphine 1 MG/ML PCA injection   Intravenous Q4H Cristi Loron, MD   15 mg at 07/06/11 1610  . DISCONTD: naloxone [facility]) injection 0.4 mg  0.4 mg Intravenous PRN Cristi Loron, MD      . DISCONTD: naloxone [facility]) injection 0.4 mg  0.4 mg Intravenous PRN Cristi Loron, MD      . DISCONTD: ondansetron [facility]) injection 4 mg  4 mg Intravenous Once PRN Rivka Barbara, MD      . DISCONTD: ondansetron [facility]) injection 4 mg  4 mg Intravenous Q6H PRN Cristi Loron, MD      . DISCONTD: ondansetron [facility]) injection 4 mg  4 mg Intravenous Q6H PRN Cristi Loron, MD      . DISCONTD: oxyCODONE-acetaminophen (PERCOCET) 5-325 MG per tablet 1-2 tablet  1-2 tablet Oral Q4H PRN Cristi Loron, MD   2 tablet at 07/04/11 1912  . DISCONTD: simvastatin (ZOCOR) tablet 5 mg  5 mg Oral q1800 Reinaldo Meeker, MD      . DISCONTD: sodium chloride 0.9 % injection 9 mL  9 mL Intravenous PRN Cristi Loron, MD      . DISCONTD: sodium chloride 0.9 % injection 9 mL  9 mL Intravenous PRN Cristi Loron, MD      . DISCONTD: thrombin spray    PRN Cristi Loron, MD   20,000 Units at 07/03/11 1322  . DISCONTD: vancomycin (VANCOCIN) IVPB 1000 mg/200 mL premix  1,000 mg Intravenous 120 min pre-op  Cristi Loron, MD      . DISCONTD: zolpidem (AMBIEN) tablet 10 mg  10 mg Oral QHS PRN Cristi Loron, MD       Medications Prior to Admission  Medication Sig Dispense Refill  . aspirin 81 MG tablet Take 81 mg by mouth daily.        . Cholecalciferol 1000 UNITS capsule Take 1,000 Units by mouth daily.        Marland Kitchen CLOBETASOL PROPIONATE E 0.05 % emollient cream Apply 1 application topically 2 (two) times daily as needed. For skin rash.      . clopidogrel (PLAVIX) 75 MG tablet Take 1 tablet (75 mg total) by mouth daily.  30 tablet  6  . fish oil-omega-3 fatty acids 1000 MG capsule Take 1 g by mouth daily.       Marland Kitchen FOLIC ACID PO Take 1 tablet by mouth daily.       Marland Kitchen HYDROcodone-acetaminophen (VICODIN) 5-500 MG per tablet Take 1 tablet by mouth 2 (two) times daily.       . metoprolol succinate (TOPROL-XL) 25 MG 24 hr tablet Take 1 tablet (25 mg total) by mouth daily.  90 tablet  3  . multivitamin (THERAGRAN) per tablet Take 1 tablet by mouth daily.        Marland Kitchen omeprazole (PRILOSEC) 40 MG capsule Take 40 mg by mouth daily.        . pravastatin (PRAVACHOL) 10 MG tablet Take 1 tablet (10 mg total) by mouth daily.  30 tablet  11  . nitroGLYCERIN (NITROSTAT) 0.4 MG SL tablet Place 0.4 mg under the tongue every 5 (five) minutes as needed. For chest pain.         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 (A) to initiate and complete roll. Rolling Left: 3: Mod assist;With rail Rolling Left Details (indicate cue type and reason): Verbal/tactile cues for sequencing, flexion of bil LEs and to maintain log roll.  Right Sidelying to Sit: 3: Mod assist;With rails;HOB flat Right Sidelying to Sit Details (indicate cue type and reason): (A) with trunk OOB with max cues for technique. Left Sidelying to Sit: 3: Mod assist;HOB elevated (comment degrees) Left Sidelying to Sit Details (indicate cue type and reason): Verbal/tactile cues for sequencing, assist to upright trunk.  Transfers Transfers: Yes Sit to Stand: 4: Min assist;From elevated surface;From bed Sit to Stand Details (indicate cue type and reason): (A) to initiate transfer with max cues for hand placement.  Pt tends to keep hands on RW. Stand to Sit: 4: Min assist;To chair/3-in-1;With armrests Stand to Sit Details: (A) to slowly descend to chair with cues for hand and LE placement. Stand Pivot Transfers: 3: Mod assist Stand Pivot Transfer Details (indicate cue type and reason): With rolling walker. Good foot clearance with pivotal stepping initially then declined in balance and tendency to shuffle feet. Approx 2 feet to chair. Ambulation/Gait Ambulation/Gait: Yes Ambulation/Gait Assistance: 4: Min assist Ambulation/Gait Assistance Details (indicate cue type and reason): (A) to maintain balance with cues for body position within RW.   Ambulation Distance (Feet): 5 Feet Assistive device: Rolling walker Gait Pattern: Step-to pattern;Shuffle Stairs: No Wheelchair Mobility Wheelchair Mobility: No  ADL: ADL Toilet Transfer: Simulated;Moderate assistance Toilet Transfer Details (indicate cue type and reason): Mod assist for steadying and to manuever RW. VC for sequencing and safe hand placement during descent.   Transferred from bed to chair. Toilet Transfer Method: Stand pivot Equipment Used: Rolling walker ADL Comments: Pt donned brace with total assist sitting EOB.  Cognition: Cognition Arousal/Alertness: Awake/alert Orientation Level: Oriented X4 Cognition Arousal/Alertness: Awake/alert Overall Cognitive Status: Appears within functional limits for tasks assessed Orientation Level: Oriented X4  Blood pressure 144/64, pulse 78, temperature 98.1 F (36.7 C), temperature source Oral, resp. rate 20, height 5\' 8"  (1.727 m), weight 90.1 kg (198 lb 10.2 oz), SpO2 94.00%. Physical Exam  Constitutional: He is oriented to person, place, and time. He appears well-developed.  HENT:  Head: Normocephalic.  Neck: Neck supple. No thyromegaly present.  Cardiovascular: Normal rate and regular rhythm.   Pulmonary/Chest: Effort normal and breath sounds normal. He has no wheezes.  Abdominal: Soft. He exhibits no distension. There is no tenderness.  Musculoskeletal:       Rheumatoid changes to hands  Neurological: He is alert and oriented to person, place, and time. No cranial nerve deficit.  Reflex Scores:      Tricep reflexes are 2+ on the right side and 2+ on the left side.      Bicep reflexes are 2+ on the right side and 2+ on the left side.  Brachioradialis reflexes are 2+ on the right side and 2+ on the left side.      Patellar reflexes are 2+ on the right side and 2+ on the left side.      Achilles reflexes are 2+ on the right side and 2+ on the left side.      Patient is cognitively intact. He follows all simple commands. Strength in the upper extremities his lower grossly 4-4+ out of 5 proximal distal. Lower extremity strength is 3/5 hip flexion 4/5 knee extension and flexion 5 out of 5 ankle dorsiflexion plantar flexion. Sensory exam grossly intact.  Skin:       Back incision clean and dry  Psychiatric: He has a normal mood and affect.    Results for orders placed during the hospital  encounter of 07/03/11 (from the past 24 hour(s))  GLUCOSE, CAPILLARY     Status: Abnormal   Collection Time   07/09/11  4:50 PM      Component Value Range   Glucose-Capillary 170 (*) 70 - 99 (mg/dL)  GLUCOSE, CAPILLARY     Status: Abnormal   Collection Time   07/09/11 10:05 PM      Component Value Range   Glucose-Capillary 140 (*) 70 - 99 (mg/dL)  GLUCOSE, CAPILLARY     Status: Abnormal   Collection Time   07/10/11  6:44 AM      Component Value Range   Glucose-Capillary 149 (*) 70 - 99 (mg/dL)   No results found.  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 1. Does the need for close, 24 hr/day medical supervision in concert with the patient's rehab needs make it unreasonable for this patient to be served in a less intensive setting? Yes 2. Co-Morbidities requiring supervision/potential complications: Acute blood loss anemia, new onset atrial fibrillation, CAD, diabetes 3. Due to bladder management, bowel management, safety, skin/wound care, disease management, medication administration, pain management and patient education, does the patient require 24 hr/day rehab nursing? Yes 4. Does the patient require coordinated care of a physician, rehab nurse, PT (1-2 hrs/day, 5 days/week) and OT (1-2 hrs/day, 5 days/week) to address physical and functional deficits in the context of the above medical diagnosis(es)? Yes Addressing deficits in the following areas: balance, endurance, locomotion, strength, transferring, bowel/bladder control, bathing, dressing, grooming and toileting 5. Can the patient actively participate in an intensive therapy program of at least 3 hrs of therapy per day at least 5 days per week? Yes 6. The potential for patient to make measurable gains while on inpatient rehab is excellent 7. Anticipated functional outcomes upon discharge from inpatients are modified independent PT, modified independent  except for donning and doffing of brace which may require minimal assistance OT,  8. Estimated rehab length of stay to reach the above functional goals is: 1 week to 10 days 9. Does the patient have adequate social supports to accommodate these discharge functional goals? Yes 10. Anticipated D/C setting: Home 11. Anticipated post D/C treatments: HH therapy 12. Overall Rehab/Functional Prognosis: excellent  RECOMMENDATIONS: This patient's condition is appropriate for continued rehabilitative care in the following setting: CIR Patient has agreed to participate in recommended program. Yes Note that insurance prior authorization may be required for reimbursement for recommended care.  Comment: Patient is quite motivated and should do very well in the inpatient rehabilitation setting. Given his multiple medical comorbidities rehabilitation is an ideal place to monitor these problems as he advances functionally. Rehabilitation nurse to followup.  Ivory Broad, MD 07/10/2011

## 2011-07-10 NOTE — Progress Notes (Signed)
Utilization review completed. Lashonne Shull, RN, BSN. 07/10/11   

## 2011-07-10 NOTE — Progress Notes (Signed)
Patient ID: Curtis Frazier, male   DOB: 1940/10/28, 71 y.o.   MRN: [id number] Subjective:  The patient is alert and pleasant. He looks and feels better. He is interested in going to inpatient rehabilitation. I've spoken to the patient's wife and daughter.  Objective: Vital signs in last 24 hours: Temp:  [97.9 F (36.6 C)-98.4 F (36.9 C)] 98.1 F (36.7 C) (01/28 1010) Pulse Rate:  [70-87] 78  (01/28 1010) Resp:  [20] 20  (01/28 1010) BP: (141-144)/(61-69) 144/64 mmHg (01/28 1010) SpO2:  [92 %-97 %] 94 % (01/28 1010)  Intake/Output from previous day:   Intake/Output this shift: Total I/O In: -  Out: 450 [Urine:450]  Physical exam the patient is alert and oriented. His lower extremity strength is grossly normal. His incision is healing well without signs of infection.  Lab Results:  Basename 07/08/11 0930  WBC 8.4  HGB 8.9*  HCT 25.9*  PLT 160   BMET No results found for this basename: NA:2,K:2,CL:2,CO2:2,GLUCOSE:2,BUN:2,CREATININE:2,CALCIUM:2 in the last 72 hours  Studies/Results: No results found.  Assessment/Plan: Postop day #7: The patient is doing much better. I'll ask rehabilitation to see the patient.  Urinary retention: We will try to discontinue his Foley again today.  Acute blood loss anemia: The patient's posttransfusion hemoglobin is 8.9.  Renal insufficiency: I'll check a BMP tomorrow.  LOS: 7 days     Tramell Piechota D 07/10/2011, 11:38 AM

## 2011-07-11 ENCOUNTER — Inpatient Hospital Stay (HOSPITAL_COMMUNITY)
Admission: RE | Admit: 2011-07-11 | Discharge: 2011-07-14 | DRG: 946 | Disposition: A | Discharge status: Home / Self Care | Payer: Medicare Other | Source: Ambulatory Visit | Attending: Physical Medicine & Rehabilitation | Admitting: Physical Medicine & Rehabilitation

## 2011-07-11 DIAGNOSIS — Z7982 Long term (current) use of aspirin: Secondary | ICD-10-CM

## 2011-07-11 DIAGNOSIS — M48061 Spinal stenosis, lumbar region without neurogenic claudication: Secondary | ICD-10-CM | POA: Diagnosis present

## 2011-07-11 DIAGNOSIS — I4891 Unspecified atrial fibrillation: Secondary | ICD-10-CM | POA: Diagnosis present

## 2011-07-11 DIAGNOSIS — IMO0002 Reserved for concepts with insufficient information to code with codable children: Secondary | ICD-10-CM | POA: Insufficient documentation

## 2011-07-11 DIAGNOSIS — M51379 Other intervertebral disc degeneration, lumbosacral region without mention of lumbar back pain or lower extremity pain: Secondary | ICD-10-CM | POA: Diagnosis present

## 2011-07-11 DIAGNOSIS — Z880 Allergy status to penicillin: Secondary | ICD-10-CM

## 2011-07-11 DIAGNOSIS — I252 Old myocardial infarction: Secondary | ICD-10-CM

## 2011-07-11 DIAGNOSIS — Z860100 Personal history of colon polyps, unspecified: Secondary | ICD-10-CM

## 2011-07-11 DIAGNOSIS — Z8719 Personal history of other diseases of the digestive system: Secondary | ICD-10-CM

## 2011-07-11 DIAGNOSIS — E785 Hyperlipidemia, unspecified: Secondary | ICD-10-CM | POA: Diagnosis present

## 2011-07-11 DIAGNOSIS — M5137 Other intervertebral disc degeneration, lumbosacral region: Secondary | ICD-10-CM | POA: Diagnosis present

## 2011-07-11 DIAGNOSIS — M47817 Spondylosis without myelopathy or radiculopathy, lumbosacral region: Secondary | ICD-10-CM

## 2011-07-11 DIAGNOSIS — I10 Essential (primary) hypertension: Secondary | ICD-10-CM | POA: Diagnosis present

## 2011-07-11 DIAGNOSIS — E119 Type 2 diabetes mellitus without complications: Secondary | ICD-10-CM | POA: Diagnosis present

## 2011-07-11 DIAGNOSIS — Z7902 Long term (current) use of antithrombotics/antiplatelets: Secondary | ICD-10-CM

## 2011-07-11 DIAGNOSIS — Z79899 Other long term (current) drug therapy: Secondary | ICD-10-CM

## 2011-07-11 DIAGNOSIS — D62 Acute posthemorrhagic anemia: Secondary | ICD-10-CM

## 2011-07-11 DIAGNOSIS — Z87442 Personal history of urinary calculi: Secondary | ICD-10-CM

## 2011-07-11 DIAGNOSIS — M4120 Other idiopathic scoliosis, site unspecified: Secondary | ICD-10-CM | POA: Diagnosis present

## 2011-07-11 DIAGNOSIS — Z5189 Encounter for other specified aftercare: Principal | ICD-10-CM

## 2011-07-11 DIAGNOSIS — Z9861 Coronary angioplasty status: Secondary | ICD-10-CM

## 2011-07-11 DIAGNOSIS — Z8249 Family history of ischemic heart disease and other diseases of the circulatory system: Secondary | ICD-10-CM

## 2011-07-11 DIAGNOSIS — Z883 Allergy status to other anti-infective agents status: Secondary | ICD-10-CM

## 2011-07-11 DIAGNOSIS — I2510 Atherosclerotic heart disease of native coronary artery without angina pectoris: Secondary | ICD-10-CM | POA: Diagnosis present

## 2011-07-11 DIAGNOSIS — M069 Rheumatoid arthritis, unspecified: Secondary | ICD-10-CM | POA: Diagnosis present

## 2011-07-11 DIAGNOSIS — K5900 Constipation, unspecified: Secondary | ICD-10-CM | POA: Diagnosis present

## 2011-07-11 LAB — BASIC METABOLIC PANEL
BUN: 13 mg/dL (ref 6–23)
Calcium: 8.9 mg/dL (ref 8.4–10.5)
Chloride: 101 mEq/L (ref 96–112)
Creatinine, Ser: 1 mg/dL (ref 0.50–1.35)
GFR calc Af Amer: 85 mL/min — ABNORMAL LOW (ref >90)

## 2011-07-11 LAB — URINALYSIS, ROUTINE W REFLEX MICROSCOPIC
Leukocytes, UA: NEGATIVE
Nitrite: NEGATIVE
Specific Gravity, Urine: 1.013 (ref 1.005–1.030)
Urobilinogen, UA: 2 mg/dL — ABNORMAL HIGH (ref 0.0–1.0)
pH: 7.5 (ref 5.0–8.0)

## 2011-07-11 LAB — GLUCOSE, CAPILLARY
Glucose-Capillary: 150 mg/dL — ABNORMAL HIGH (ref 70–99)
Glucose-Capillary: 189 mg/dL — ABNORMAL HIGH (ref 70–99)
Glucose-Capillary: 159 mg/dL — ABNORMAL HIGH (ref 70–99)

## 2011-07-11 LAB — CULTURE, BLOOD (ROUTINE X 2)
Culture  Setup Time: 201301230346
Culture: NO GROWTH

## 2011-07-11 MED ORDER — GUAIFENESIN-DM 100-10 MG/5ML PO SYRP: 5.0000 mL | ORAL_SOLUTION | Freq: Four times a day (QID) | ORAL | Status: DC | PRN

## 2011-07-11 MED ORDER — SIMVASTATIN 5 MG PO TABS
5.0000 mg | ORAL_TABLET | Freq: Every day | ORAL | Status: DC
  Administered 2011-07-12 – 2011-07-13 (×2): 5 mg via ORAL
  Filled 2011-07-11 (×3): qty 1

## 2011-07-11 MED ORDER — ALUM & MAG HYDROXIDE-SIMETH 400-400-40 MG/5ML PO SUSP: 30.0000 mL | ORAL | Status: DC | PRN

## 2011-07-11 MED ORDER — ALUM & MAG HYDROXIDE-SIMETH 200-200-20 MG/5ML PO SUSP: 30.0000 mL | ORAL | Status: DC | PRN

## 2011-07-11 MED ORDER — PANTOPRAZOLE SODIUM 40 MG PO TBEC
40.0000 mg | DELAYED_RELEASE_TABLET | Freq: Every day | ORAL | Status: DC
  Administered 2011-07-12 – 2011-07-14 (×3): 40 mg via ORAL
  Filled 2011-07-11 (×4): qty 1

## 2011-07-11 MED ORDER — METHOCARBAMOL 500 MG PO TABS
500.0000 mg | ORAL_TABLET | Freq: Four times a day (QID) | ORAL | Status: DC | PRN
  Administered 2011-07-12 – 2011-07-13 (×2): 500 mg via ORAL
  Filled 2011-07-11 (×4): qty 1

## 2011-07-11 MED ORDER — METOPROLOL SUCCINATE ER 25 MG PO TB24
25.0000 mg | ORAL_TABLET | Freq: Every day | ORAL | Status: DC
  Administered 2011-07-12 – 2011-07-14 (×3): 25 mg via ORAL
  Filled 2011-07-11 (×4): qty 1

## 2011-07-11 MED ORDER — PROMETHAZINE HCL 12.5 MG RE SUPP: 12.5000 mg | Freq: Four times a day (QID) | RECTAL | Status: DC | PRN

## 2011-07-11 MED ORDER — INSULIN ASPART 100 UNIT/ML ~~LOC~~ SOLN
0.0000 [IU] | Freq: Three times a day (TID) | SUBCUTANEOUS | Status: DC
  Administered 2011-07-11 – 2011-07-12 (×2): 2 [IU] via SUBCUTANEOUS
  Administered 2011-07-12 – 2011-07-13 (×3): 3 [IU] via SUBCUTANEOUS
  Administered 2011-07-13 – 2011-07-14 (×2): 2 [IU] via SUBCUTANEOUS

## 2011-07-11 MED ORDER — DIAZEPAM 5 MG PO TABS: 5.0000 mg | ORAL_TABLET | Freq: Four times a day (QID) | ORAL | Status: DC | PRN

## 2011-07-11 MED ORDER — DIPHENHYDRAMINE HCL 12.5 MG/5ML PO ELIX: 12.5000 mg | ORAL_SOLUTION | Freq: Four times a day (QID) | ORAL | Status: DC | PRN

## 2011-07-11 MED ORDER — NITROGLYCERIN 0.4 MG SL SUBL: 0.4000 mg | SUBLINGUAL_TABLET | SUBLINGUAL | Status: DC | PRN

## 2011-07-11 MED ORDER — FOLIC ACID 1 MG PO TABS
1.0000 mg | ORAL_TABLET | Freq: Every day | ORAL | Status: DC
  Administered 2011-07-12 – 2011-07-14 (×3): 1 mg via ORAL
  Filled 2011-07-11 (×4): qty 1

## 2011-07-11 MED ORDER — OXYCODONE-ACETAMINOPHEN 5-325 MG PO TABS: 1.0000 | ORAL_TABLET | ORAL | Status: DC | PRN

## 2011-07-11 MED ORDER — BISACODYL 10 MG RE SUPP: 10.0000 mg | Freq: Every day | RECTAL | Status: DC | PRN

## 2011-07-11 MED ORDER — METHOTREXATE 2.5 MG PO TABS
17.5000 mg | ORAL_TABLET | ORAL | Status: DC
  Administered 2011-07-12: 17.5 mg via ORAL
  Filled 2011-07-11: qty 7

## 2011-07-11 MED ORDER — VITAMIN D3 25 MCG (1000 UNIT) PO TABS
1000.0000 [IU] | ORAL_TABLET | Freq: Every day | ORAL | Status: DC
  Administered 2011-07-12 – 2011-07-14 (×3): 1000 [IU] via ORAL
  Filled 2011-07-11 (×4): qty 1

## 2011-07-11 MED ORDER — ACETAMINOPHEN 325 MG PO TABS
325.0000 mg | ORAL_TABLET | ORAL | Status: DC | PRN
  Administered 2011-07-12: 650 mg via ORAL
  Filled 2011-07-11 (×2): qty 2

## 2011-07-11 MED ORDER — SIMVASTATIN 5 MG PO TABS
5.0000 mg | ORAL_TABLET | Freq: Every day | ORAL | Status: DC
  Administered 2011-07-11: 5 mg via ORAL

## 2011-07-11 MED ORDER — POLYETHYLENE GLYCOL 3350 17 G PO PACK
17.0000 g | PACK | Freq: Every day | ORAL | Status: DC | PRN
  Filled 2011-07-11: qty 1

## 2011-07-11 MED ORDER — TRAZODONE HCL 50 MG PO TABS: 25.0000 mg | ORAL_TABLET | Freq: Every evening | ORAL | Status: DC | PRN

## 2011-07-11 MED ORDER — INSULIN ASPART 100 UNIT/ML ~~LOC~~ SOLN
0.0000 [IU] | Freq: Every day | SUBCUTANEOUS | Status: DC
  Administered 2011-07-13: 1 [IU] via SUBCUTANEOUS
  Filled 2011-07-11: qty 3

## 2011-07-11 MED ORDER — PROMETHAZINE HCL 12.5 MG PO TABS: 12.5000 mg | ORAL_TABLET | Freq: Four times a day (QID) | ORAL | Status: DC | PRN

## 2011-07-11 MED ORDER — PROMETHAZINE HCL 25 MG/ML IJ SOLN: 12.5000 mg | Freq: Four times a day (QID) | INTRAMUSCULAR | Status: DC | PRN

## 2011-07-11 MED ORDER — ASPIRIN EC 81 MG PO TBEC
81.0000 mg | DELAYED_RELEASE_TABLET | Freq: Every day | ORAL | Status: DC
  Administered 2011-07-12 – 2011-07-14 (×3): 81 mg via ORAL
  Filled 2011-07-11 (×4): qty 1

## 2011-07-11 MED ORDER — PHENOL 1.4 % MT LIQD
1.0000 | OROMUCOSAL | Status: DC | PRN
  Filled 2011-07-11: qty 177

## 2011-07-11 MED ORDER — OMEGA-3-ACID ETHYL ESTERS 1 G PO CAPS
1.0000 g | ORAL_CAPSULE | Freq: Every day | ORAL | Status: DC
  Administered 2011-07-12 – 2011-07-14 (×3): 1 g via ORAL
  Filled 2011-07-11 (×4): qty 1

## 2011-07-11 MED ORDER — ENSURE PUDDING PO PUDG
1.0000 | Freq: Three times a day (TID) | ORAL | Status: DC
  Administered 2011-07-11 – 2011-07-14 (×7): 1 via ORAL

## 2011-07-11 MED ORDER — OXYCODONE HCL 5 MG PO TABS
5.0000 mg | ORAL_TABLET | ORAL | Status: DC | PRN
  Administered 2011-07-11 – 2011-07-12 (×5): 10 mg via ORAL
  Administered 2011-07-13: 5 mg via ORAL
  Administered 2011-07-13: 10 mg via ORAL
  Administered 2011-07-13: 5 mg via ORAL
  Administered 2011-07-13 – 2011-07-14 (×3): 10 mg via ORAL
  Filled 2011-07-11 (×6): qty 2
  Filled 2011-07-11: qty 1
  Filled 2011-07-11 (×2): qty 2
  Filled 2011-07-11: qty 1
  Filled 2011-07-11: qty 2

## 2011-07-11 MED ORDER — FLEET ENEMA 7-19 GM/118ML RE ENEM
1.0000 | ENEMA | Freq: Once | RECTAL | Status: AC | PRN
  Filled 2011-07-11: qty 1

## 2011-07-11 NOTE — Progress Notes (Signed)
Rehab admissions - Evaluated for possible admission.  I spoke with patient and his wife.  They are agreeable to inpatient rehab.  Bed available and can admit to inpatient rehab today.  Pager [phone]

## 2011-07-11 NOTE — H&P (Signed)
Physical Medicine and Rehabilitation Admission H&P  Chief complaint on file.  HPI: Curtis Frazier is an 71 y.o.right-handed male with history of rheumatoid arthritis and chronic back pain radiating lower extremities. Recent lumbar MRI and myelogram showed significant thoracic lumbar scoliosis with degenerative changes and radiculopathy. Underwent decompressive laminectomy lumbar L. 12, lumbar L2-3, lumbar 4-5 as well as posterior segmental thoracic T. 10 lumbar L5 pedicle screws and rods as well as thoracic T10-11, and thoracic T11-12, T12-L1, lumbar L1-2, 2-3, 3-4, C4-5 posterior lateral arthrodesis per Dr. Lovell Sheehan January 21. Fitted with a back brace to be donned when supine. Postoperative anemia 7.5 and transfused on January 25 with latest hemoglobin 8.9. New onset atrial fibrillation with followup of [place] cardiology services. Echocardiogram with ejection fraction 65% no wall motion abnormalities. Venous Doppler studies negative for deep vein thrombosis. Initially placed on Amiodarone which is since been discontinued as patient converted to sinus rhythm and is holding sinus.. Patient remains on Toprol 25 mg daily. Cardiac panel negative.  Currently patient with SOB requiring 4L O2.  Last CXR with BLL opacities.  Has had urinary retention requiring I & O cath's. Patient continues with weakness and poor safety with mobility.   @ROS @ Past Medical History  Diagnosis Date  . Other and unspecified hyperlipidemia   . Coronary atherosclerosis of unspecified type of vessel, native or graft   . Personal history of colonic polyps   . Actinic keratosis   . Psoriatic arthropathy   . Other psoriasis   . Scoliosis (and kyphoscoliosis), idiopathic   . GI bleed 12/10    cecam AVM  . Gastritis 12/10  . Hyperlipidemia     takes Pravastatin daily  . Hypertension     takes Metoprolol daily  . Myocardial infarction 2010  . Shortness of breath     with exertion  . Pneumonia     hx of-2008  . Headache    occasionally  . Joint pain   . Joint swelling   . Rheumatoid arthritis     takes Methotrexate 7pills weekly  . Degeneration of intervertebral disc, site unspecified   . Chronic back pain     scoliosis/stenosis/radiculopathy,degenerative disc disease  . Psoriasis   . Bruises easily   . Esophageal reflux     takes Omeprazole daily  . History of colonic polyps   . Hemorrhoids   . Kidney stone     hx of  . Blood transfusion 2010  . Type II or unspecified type diabetes mellitus without mention of complication, not stated as uncontrolled     type II;controlled by diet and exercise   Past Surgical History  Procedure Date  . Rotator cuff repair 2000    left   . Colonoscopy   . Vasectomy 1979  . Ptca stent 02/2009    has one stent  . Carotid doppler 10/12    0-39% R and 60-79% left   . Cataract surgery 2012  . Cardiac catheterization 2010  . Lithotripsy 2009   Family History  Problem Relation Age of Onset  . Coronary artery disease Mother   . Diabetes Mother   . Heart disease Mother     CAD  . Coronary artery disease Sister   . Hypertension Sister   . Kidney failure Sister   . Heart disease Sister     CAD  . Pancreatic cancer Brother   . Diabetes Brother   . Prostate cancer Brother   . Cancer Brother     pancreatic CA  .  Diabetes Brother   . Anesthesia problems Neg Hx   . Hypotension Neg Hx   . Malignant hyperthermia Neg Hx   . Pseudochol deficiency Neg Hx    Social History:  reports that he has quit smoking. He does not have any smokeless tobacco history on file. He reports that he does not drink alcohol or use illicit drugs.   Allergies  Allergen Reactions  . Penicillins     REACTION: rash, swelling  . Tetracycline     REACTION: rash, swelling   Hospital medications:    . aspirin EC  81 mg Oral Daily  . cholecalciferol  1,000 Units Oral Daily  . docusate sodium  100 mg Oral BID  . folic acid  1 mg Oral Daily  . insulin aspart  0-15 Units Subcutaneous  TID WC  . insulin aspart  0-5 Units Subcutaneous QHS  . insulin aspart  4 Units Subcutaneous TID WC  . methotrexate  17.5 mg Oral Weekly  . metoprolol succinate  25 mg Oral Daily  . omega-3 acid ethyl esters  1 g Oral Daily  . pantoprazole  40 mg Oral Q1200  . simvastatin  5 mg Oral q1800   Medications Prior to Admission  Medication Sig Dispense Refill  . aspirin 81 MG tablet Take 81 mg by mouth daily.        . Cholecalciferol 1000 UNITS capsule Take 1,000 Units by mouth daily.        Marland Kitchen CLOBETASOL PROPIONATE E 0.05 % emollient cream Apply 1 application topically 2 (two) times daily as needed. For skin rash.      . clopidogrel (PLAVIX) 75 MG tablet Take 1 tablet (75 mg total) by mouth daily.  30 tablet  6  . fish oil-omega-3 fatty acids 1000 MG capsule Take 1 g by mouth daily.       Marland Kitchen FOLIC ACID PO Take 1 tablet by mouth daily.       . metoprolol succinate (TOPROL-XL) 25 MG 24 hr tablet Take 1 tablet (25 mg total) by mouth daily.  90 tablet  3  . multivitamin (THERAGRAN) per tablet Take 1 tablet by mouth daily.        Marland Kitchen omeprazole (PRILOSEC) 40 MG capsule Take 40 mg by mouth daily.        . pravastatin (PRAVACHOL) 10 MG tablet Take 1 tablet (10 mg total) by mouth daily.  30 tablet  11  . diazepam (VALIUM) 5 MG tablet Take 1 tablet (5 mg total) by mouth every 6 (six) hours as needed.  50 tablet  1  . nitroGLYCERIN (NITROSTAT) 0.4 MG SL tablet Place 0.4 mg under the tongue every 5 (five) minutes as needed. For chest pain.       Marland Kitchen oxyCODONE-acetaminophen (PERCOCET) 5-325 MG per tablet Take 1-2 tablets by mouth every 4 (four) hours as needed.  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  Rolling Left: 3: Mod assist;With rail Rolling Left Details (indicate cue type and reason): Verbal/tactile cues for sequencing, flexion of bil LEs and to maintain log roll.  Right Sidelying to Sit: With rails;HOB flat;4: Min assist Right Sidelying to Sit Details (indicate cue type and reason): (A) to upright trunk, (A) required for bil. LE advancement and positioning. Verbal cues for precautions Left Sidelying to Sit: 3: Mod assist;HOB elevated (comment degrees) Left Sidelying to Sit Details (indicate cue type and reason): Verbal/tactile cues for sequencing, assist to upright trunk.  Transfers Transfers: Yes Sit to Stand: 4: Min assist;From chair/3-in-1 Sit to Stand Details (indicate cue type and reason): (A) to initiate transfer with max cues for hand placement.  Pt tends to keep hands on RW. Stand to Sit: 4: Min assist;With upper extremity assist;To chair/3-in-1 Stand to Sit Details: Heavy min assist from bed, less assist required from chair with arm rests. Pt has most difficulty getting UEs from support surface to RW and has to "jump" hands to RW demonstrating weakness and unsafe ability to transfer.  Stand Pivot Transfers: 3: Mod assist Stand Pivot Transfer Details (indicate cue type and reason): With rolling walker. Good foot clearance with pivotal stepping initially then declined in balance and tendency to shuffle feet. Approx 2 feet to chair. Ambulation/Gait Ambulation/Gait: Yes Ambulation/Gait Assistance: 4: Min  assist Ambulation/Gait Assistance Details (indicate cue type and reason): Assist to promote improved balance and negotiate RW in room. Verbal cues for safe distancing of RW, pt tends to get RW too far ahead of self.  Ambulation Distance (Feet): 50 Feet (Total: 25 then 25) Assistive device: Rolling walker Gait Pattern: Shuffle;Step-to pattern;Step-through pattern;Decreased stride length Gait velocity: Pt with frequent stops during ambulation Stairs: No Wheelchair Mobility Wheelchair Mobility: No  ADL: ADL Grooming: Performed;Wash/dry hands;Minimal assistance Grooming Details (indicate cue type and reason): inst. and demonstrational cues not to twist while standing at sink, inst. pt. to push walker up to the sink, and stand inside of it Where Assessed - Grooming: Standing at sink Toilet Transfer: Performed;Minimal assistance Toilet Transfer Details (indicate cue type and reason): Mod assist for steadying and to manuever RW. VC for sequencing and safe hand placement during descent.  Transferred from bed to chair. Toilet Transfer Method: Proofreader: Raised toilet seat with arms (or 3-in-1 over toilet) Toileting - Clothing Manipulation: Performed;Minimal assistance Where Assessed - Toileting Clothing Manipulation: Sit to stand from 3-in-1 or toilet Toileting - Hygiene: Performed;Minimal assistance Toileting - Hygiene Details (indicate cue type and reason): cues for maintaining back precautions Where Assessed - Toileting Hygiene: Sit to stand from 3-in-1 or toilet Equipment Used: Rolling walker Ambulation Related to ADLs: cues for walker safety and maintaining back precautions ADL Comments: reviewed with pt. and family methods for toilet hygiene while maintaining back precautions  Cognition: Cognition Arousal/Alertness: Awake/alert Orientation Level: Oriented X4 Cognition Arousal/Alertness: Awake/alert Overall Cognitive Status: Appears within functional limits  for tasks assessed Orientation Level: Oriented X4   Blood pressure 136/71, pulse 71, temperature 98.7 F (37.1 C), temperature source Oral, resp. rate 19, height 5\' 8"  (1.727 m), weight 90.1 kg (198 lb 10.2 oz), SpO2 95.00%. PHYSICAL EXAM Pt is alert and oriented x3.  A little more quiet and flat then yesterday.  Mucosa pink and moist.  Dentition is fair.  Neck is supple without jvd or lymphyadenopathy. hrt is RRR without MRG.  Chest is clear except  for some mild rales along the bases.  Air movement good.  Abdomen is soft nontender and non distended.  Bowel sounds are positive.  Wound is clean and well approximated with minimal drainage.  Scar tissue noted around wound.  No swelling is seen.   Neuro:  CN exam intact.  Cognition is appropriate. Motor exam is 4 to 4+ in ue's and 3+ proximately to 4/5 distally.  No sensory deficits seen.  DTR's 1+.   Skin:grossly intact except for op site. CBG (last 3)   Basename 07/11/11 0825 07/11/11 0634 07/10/11 2134  GLUCAP 189* 150* 149*   Lab Results  Component Value Date   WBC 8.4 07/08/2011   HGB 8.9* 07/08/2011   HCT 25.9* 07/08/2011   MCV 91.2 07/08/2011   PLT 160 07/08/2011   Lab Results  Component Value Date   HGBA1C 7.0* 11/18/2010  BMET    Component Value Date/Time   NA 135 07/11/2011 0550   K 3.9 07/11/2011 0550   CL 101 07/11/2011 0550   CO2 26 07/11/2011 0550   GLUCOSE 163* 07/11/2011 0550   BUN 13 07/11/2011 0550   CREATININE 1.00 07/11/2011 0550   CALCIUM 8.9 07/11/2011 0550   GFRNONAA 74* 07/11/2011 0550   GFRAA 85* 07/11/2011 0550     .No results found.  Post Admission Physician Evaluation: 1. Functional deficits secondary  to severe lumbar stenosis with radiculopathy. 2. Patient is admitted to receive collaborative, interdisciplinary care between the physiatrist, rehab nursing staff, and therapy team. 3. Patient's level of medical complexity and substantial therapy needs in context of that medical necessity cannot be provided at a  lesser intensity of care such as a SNF. 4. Patient has experienced substantial functional loss from his/her baseline which was documented above under the "Functional History" and "Functional Status" headings.  Judging by the patient's diagnosis, physical exam, and functional history, the patient has potential for functional progress which will result in measurable gains while on inpatient rehab.  These gains will be of substantial and practical use upon discharge  in facilitating mobility and self-care at the household level. 5. Physiatrist will provide 24 hour management of medical needs as well as oversight of the therapy plan/treatment and provide guidance as appropriate regarding the interaction of the two. 6. 24 hour rehab nursing will assist with bladder management, bowel management, safety, skin/wound care, disease management, medication administration, pain management and patient education  and help integrate therapy concepts, techniques,education, etc. 7. PT will assess and treat for:  Lower extremity strength, ROM, adaptive equipment, back precautions, safety, gait and transfers.  Goals are: modified independent to supervision. 8. OT will assess and treat for: UES, ADL's, safety, fxnl mobility, adaptive equipment training, don/doffing of brace.   Goals are: modified independent to set up. 9. SLP will assess and treat for:  10. Case Management and Social Worker will assess and treat for psychological issues and discharge planning. 11. Team conference will be held weekly to assess progress toward goals and to determine barriers to discharge. 12.  Patient will receive at least 3 hours of therapy per day at least 5 days per week. 13. ELOS and Prognosis: 7-10 days excellent   Medical Problem List and Plan: 1. DVT Prophylaxis/Anticoagulation: Mechanical: Sequential compression devices, below knee Bilateral lower extremities  2. Pain Management: prn oxycodone, tylenol, robaxin.  May utilize heat  for muscle spasms as long as not on wound.  3. Mood: provide ego support as needed.  Has been fairly upbeat.  4.DM type  2:  Diet controlled.  Currently elevated due to stress of surgery, pain, etc. Monitor with ac/hs cbg checks.  May need oral agent as last Hbg AIC @ 7.0.  Will monitor for now.  5. HTN: monitor with bid checks. Pain control important for bp control also.  6. PAF:  Post op  arrythmia resolved. Continue toprol 25 mg daily as at home and follow closely as we push him physically with therapies. In NSR today  7. RA:  On methotrexate weekly with folic acid for supplement.  8. Dyslipidemia:  Continue daily zocor.    Ivory Broad, MD 07/11/11

## 2011-07-11 NOTE — Discharge Summary (Signed)
  Physician Discharge Summary  Patient ID: Curtis Frazier MRN: [id number] DOB/AGE: 1940-10-03 71 y.o.  Admit date: 07/03/2011 Discharge date: 07/11/2011  Admission Diagnoses:Thoracolumbar scoliosis, stenosis, disc degeneration  Discharge Diagnoses: Same Principal Problem:  *New onset a-fib Active Problems:  ANEMIA-UNSPECIFIED  C A D  Aortic stenosis   Discharged Condition: good  Hospital Course: I admitted the patient and performed a T10-L5 fusion/instrumentation on 07/03/11. The patient developed acute blood loss anemia and was transfused 2 units of PRBCs. He also developed A fib which was managed by Dr. Ellyn Hack. He had hypoxia which was managed by Dr. Sung Amabile. This all resolved and he was transferred to inpatient rehab on 07/10/29.  Consults:as above Significant Diagnostic Studies:Chest CT Treatments:As above Discharge Exam: Blood pressure 136/71, pulse 71, temperature 98.7 F (37.1 C), temperature source Oral, resp. rate 19, height 5\' 8"  (1.727 m), weight 90.1 kg (198 lb 10.2 oz), SpO2 94.00%. Normal strength. Wound healing well   Disposition: Rehab  Discharge Orders    Future Orders Please Complete By Expires   Diet - low sodium heart healthy      Increase activity slowly      No dressing needed        Medication List  As of 07/11/2011  5:10 PM   STOP taking these medications         HYDROcodone-acetaminophen 5-500 MG per tablet      methotrexate 2.5 MG tablet         TAKE these medications         Cholecalciferol 1000 UNITS capsule   Take 1,000 Units by mouth daily.      CLOBETASOL PROPIONATE E 0.05 % emollient cream   Generic drug: Clobetasol Prop Emollient Base   Apply 1 application topically 2 (two) times daily as needed. For skin rash.      clopidogrel 75 MG tablet   Commonly known as: PLAVIX   Take 1 tablet (75 mg total) by mouth daily.      fish oil-omega-3 fatty acids 1000 MG capsule   Take 1 g by mouth daily.      FOLIC ACID PO   Take 1 tablet by  mouth daily.      metoprolol succinate 25 MG 24 hr tablet   Commonly known as: TOPROL-XL   Take 1 tablet (25 mg total) by mouth daily.      multivitamin per tablet   Take 1 tablet by mouth daily.      nitroGLYCERIN 0.4 MG SL tablet   Commonly known as: NITROSTAT   Place 0.4 mg under the tongue every 5 (five) minutes as needed. For chest pain.      omeprazole 40 MG capsule   Commonly known as: PRILOSEC   Take 40 mg by mouth daily.      pravastatin 10 MG tablet   Commonly known as: PRAVACHOL   Take 1 tablet (10 mg total) by mouth daily.      REFRESH OP   Place 2-3 drops into both eyes daily as needed. For dry eyes.             SignedCristi Loron 07/11/2011, 5:10 PM

## 2011-07-11 NOTE — Progress Notes (Signed)
Physical Therapy Treatment Patient Details Name: Curtis Frazier MRN: [id number] DOB: Apr 08, 1941 Today's Date: 07/11/2011  PT Assessment/Plan  PT - Assessment/Plan Comments on Treatment Session: Pt making good progress however continues to need assist and demonstrates decreased safety with basic transfers such as sit to stand during which he has to "jump" his hand to his RW from  the support surface and is unable to fully extend knees until hands are on RW. Pt still unable to recall back precautions. Recommend short stay at [facility] to promote safe return home. Pt desiring PT treatment today as rehab PT will likely not start until tomorrow.  PT Plan: Frequency remains appropriate;Discharge plan needs to be updated PT Frequency: Min 6X/week Follow Up Recommendations: Inpatient Rehab Equipment Recommended: Defer to next venue PT Goals  Acute Rehab PT Goals Pt will Roll Supine to Right Side: with modified independence PT Goal: Rolling Supine to Right Side - Progress: Progressing toward goal Pt will go Supine/Side to Sit: with modified independence PT Goal: Supine/Side to Sit - Progress: Progressing toward goal Pt will go Stand to Sit: with supervision PT Goal: Stand to Sit - Progress: Progressing toward goal Pt will Ambulate: 51 - 150 feet;with supervision;with least restrictive assistive device PT Goal: Ambulate - Progress: Progressing toward goal Additional Goals PT Goal: Additional Goal #1 - Progress: Progressing toward goal  PT Treatment Precautions/Restrictions  Precautions Precautions: Back Precaution Comments: Requires verbal cues to recall 3/3 back precautions.  Required Braces or Orthoses: Yes Spinal Brace: Lumbar corset;Applied in sitting position Restrictions Weight Bearing Restrictions: No Mobility (including Balance) Bed Mobility Bed Mobility: Yes Rolling Right: With rail;5: Supervision Rolling Right Details (indicate cue type and reason): Pt requires rail, verbal cues for  sequence.  Right Sidelying to Sit: With rails;HOB flat;4: Min assist Right Sidelying to Sit Details (indicate cue type and reason): "That was hard!" Assist for LE placement.  Transfers Transfers: Yes Sit to Stand: 4: Min assist;From elevated surface;From bed Sit to Stand Details (indicate cue type and reason): Multiple attempts required, pt continues to "jump" hands from bed to RW and is unable to stand up fully without UE assist demonstrating LE weakness.  Stand to Sit: 4: Min assist;To chair/3-in-1;With armrests Stand to Sit Details: Heavy min assist. Pt with decreased ability to control descent. Verbal cues for UE positioning.  Ambulation/Gait Ambulation/Gait: Yes Ambulation/Gait Assistance: 4: Min assist Ambulation/Gait Assistance Details (indicate cue type and reason): Rt. quad facilitation to promote increased knee extension during Rt. LE stance. Verbal cues for safe distance of RW. Verbal cues for RW positioning during turning.  Ambulation Distance (Feet): 60 Feet Assistive device: Rolling walker Gait Pattern: Shuffle;Step-to pattern;Step-through pattern;Decreased stride length;Trunk flexed Gait velocity: Very Slow cadence Stairs: No Wheelchair Mobility Wheelchair Mobility: No    Exercise  General Exercises - Lower Extremity Long Arc Quad: AROM;Both;Seated;20 reps (manually resisted) Toe Raises: AROM;Both;Seated;20 reps Heel Raises: AROM;Both;Other reps (comment);Seated (20 reps) End of Session PT - End of Session Equipment Utilized During Treatment: Back brace;Gait belt Activity Tolerance: Patient limited by fatigue;Patient tolerated treatment well Patient left: in chair;with call bell in reach;with family/visitor present Nurse Communication: Mobility status for ambulation General Behavior During Session: [facility] for tasks performed Cognition: [facility] for tasks performed  Sherie Don 07/11/2011, 2:22 PM  Sherie Don) Carleene Mains PT, DPT Acute Rehabilitation [phone]

## 2011-07-11 NOTE — Progress Notes (Signed)
Patient ID: Curtis Frazier, male   DOB: [birth year], 71 y.o.   MRN: [id number] Subjective:  The patient is alert and pleasant. He looks well and is awaiting rehabilitation placement.  Objective: Vital signs in last 24 hours: Temp:  [98 F (36.7 C)-98.9 F (37.2 C)] 98.7 F (37.1 C) (01/29 0600) Pulse Rate:  [71-87] 71  (01/29 0600) Resp:  [18-20] 19  (01/29 0600) BP: (136-158)/(64-71) 136/71 mmHg (01/29 0600) SpO2:  [90 %-97 %] 95 % (01/29 0600)  Intake/Output from previous day: 01/28 0701 - 01/29 0700 In: -  Out: 1250 [Urine:1250] Intake/Output this shift:    Physical exam the patient is alert and oriented. He is moving all 4 extremities well.  Lab Results:  Basename 07/08/11 0930  WBC 8.4  HGB 8.9*  HCT 25.9*  PLT 160   BMET  Basename 07/11/11 0550  NA 135  K 3.9  CL 101  CO2 26  GLUCOSE 163*  BUN 13  CREATININE 1.00  CALCIUM 8.9    Studies/Results: No results found.  Assessment/Plan: Postop day #8: The patient is ready for rehabilitation placement.  LOS: 8 days     Bernese Doffing D 07/11/2011, 7:25 AM

## 2011-07-11 NOTE — PMR Pre-admission (Signed)
PMR Admission Coordinator Pre-Admission Assessment  Patient:  Curtis Frazier is an 71 y.o., male MRN:  [id number] DOB:  [birth year] Height:  5\' 8"  (172.7 cm) Weight:  90.1 kg (198 lb 10.2 oz)  Insurance Information: HMO: No HMO    PPO:      PCP:      IPA:      80/20:      OTHER:  PRIMARY:Medicare A/B      Policy#:[id number]      Subscriber:Curtis Frazier CM Name:       Phone#:      Fax#:  Pre-Cert#:       Employer:Worked parttime Benefits:  Phone #:      Name:[employer] Eff. Date:07/13/04 and 06/12/06   Deduct:$1184  Out of Pocket Max:0  Life OZH:[id number] CIR:100%      SNF:100 days   LBD = 05-25-09 Outpatient:80%     Co-Pay:20% Home Health:100%      Co-Pay:none DME:80%     Co-Pay:20% Providers:patient's choice  SECONDARY:[employer] [place] sup      Policy#:[id number]      Subscriber:Curtis Frazier CM Name:       Phone#:      Fax#:  Pre-Cert#:       Employer:Retired Benefits:  Phone #[phone]     Name:  Eff. Date:      Deduct:       Out of Pocket Max:       Life Max:  CIR:       SNF:  Outpatient:      Co-Pay:  Home Health:       Co-Pay:  DME:      Co-Pay: Providers:  In network   Current Medical History:   Patient Admitting Diagnosis: Thoracolumbar decompression/ fusion  History of Present Illness: Admitted 07/03/11 with history or RA and chronic back pain radiating to lower extremities.  MRI showed significant thoracic lumbar scoliosis with degenerative changes and radiculopathy.  Underwent decompressive laminectomy/decompression with multilevel fusion by Dr Lovell Sheehan on 01/21.  Fitted with back brace to be on when out of bed.  Post op anemia and transfused on 01/25  New onset atrial fib with cardiology following.  Now on toprol daily.  Patients Past Medical History:   Past Medical History  Diagnosis Date  . Other and unspecified hyperlipidemia   . Coronary atherosclerosis of unspecified type of vessel, native or graft   . Personal history of colonic polyps   . Actinic keratosis   .  Psoriatic arthropathy   . Other psoriasis   . Scoliosis (and kyphoscoliosis), idiopathic   . GI bleed 12/10    cecam AVM  . Gastritis 12/10  . Hyperlipidemia     takes Pravastatin daily  . Hypertension     takes Metoprolol daily  . Myocardial infarction 2010  . Shortness of breath     with exertion  . Pneumonia     hx of-2008  . Headache     occasionally  . Joint pain   . Joint swelling   . Rheumatoid arthritis     takes Methotrexate 7pills weekly  . Degeneration of intervertebral disc, site unspecified   . Chronic back pain     scoliosis/stenosis/radiculopathy,degenerative disc disease  . Psoriasis   . Bruises easily   . Esophageal reflux     takes Omeprazole daily  . History of colonic polyps   . Hemorrhoids   . Kidney stone     hx of  . Blood transfusion 2010  . Type  II or unspecified type diabetes mellitus without mention of complication, not stated as uncontrolled     type II;controlled by diet and exercise   Family Medical History:  family history includes Cancer in his brother; Coronary artery disease in his mother and sister; Diabetes in his brothers and mother; Heart disease in his mother and sister; Hypertension in his sister; Kidney failure in his sister; Pancreatic cancer in his brother; and Prostate cancer in his brother.  There is no history of Anesthesia problems, and Hypotension, and Malignant hyperthermia, and Pseudochol deficiency, . Patients Current Diet: Carb Control  Prior Rehab/Hospitalizations: Had outpatient therapy of L shoulder in 2000.  Current Medications: Current facility-administered medications:acetaminophen (TYLENOL) suppository 650 mg, 650 mg, Rectal, Q4H PRN, Cristi Loron, MD;  acetaminophen (TYLENOL) tablet 650 mg, 650 mg, Oral, Q4H PRN, Cristi Loron, MD, 650 mg at 07/06/11 2004;  alum & mag hydroxide-simeth (MAALOX/MYLANTA) 200-200-20 MG/5ML suspension 30 mL, 30 mL, Oral, Q4H PRN, Reinaldo Meeker, MD, 30 mL at 07/07/11  1842 aspirin EC tablet 81 mg, 81 mg, Oral, Daily, Cristi Loron, MD, 81 mg at 07/11/11 1043;  cholecalciferol (VITAMIN D) tablet 1,000 Units, 1,000 Units, Oral, Daily, Cristi Loron, MD, 1,000 Units at 07/11/11 1043;  diazepam (VALIUM) tablet 5 mg, 5 mg, Oral, Q6H PRN, Cristi Loron, MD, 5 mg at 07/07/11 2208;  docusate sodium (COLACE) capsule 100 mg, 100 mg, Oral, BID, Cristi Loron, MD, 100 mg at 07/11/11 1043 folic acid (FOLVITE) tablet 1 mg, 1 mg, Oral, Daily, Cristi Loron, MD, 1 mg at 07/11/11 1043;  insulin aspart (novoLOG) injection 0-15 Units, 0-15 Units, Subcutaneous, TID WC, Billy Fischer, MD, 3 Units at 07/11/11 0827;  insulin aspart (novoLOG) injection 0-5 Units, 0-5 Units, Subcutaneous, QHS, Billy Fischer, MD, 3 Units at 07/08/11 2208 insulin aspart (novoLOG) injection 4 Units, 4 Units, Subcutaneous, TID WC, Billy Fischer, MD, 4 Units at 07/11/11 0827;  lactated ringers infusion, , Intravenous, Continuous, Lonia Farber, MD;  methotrexate (RHEUMATREX) tablet 17.5 mg, 17.5 mg, Oral, Weekly, Reinaldo Meeker, MD;  metoprolol succinate (TOPROL-XL) 24 hr tablet 25 mg, 25 mg, Oral, Daily, Cristi Loron, MD, 25 mg at 07/11/11 1043 morphine 2 MG/ML injection 2-4 mg, 2-4 mg, Intravenous, Q4H PRN, Cristi Loron, MD, 2 mg at 07/08/11 1610;  nitroGLYCERIN (NITROSTAT) SL tablet 0.4 mg, 0.4 mg, Sublingual, Q5 Min x 3 PRN, Cristi Loron, MD;  nitroGLYCERIN (NITROSTAT) SL tablet 0.4 mg, 0.4 mg, Sublingual, Q5 min PRN, Reinaldo Meeker, MD;  omega-3 acid ethyl esters (LOVAZA) capsule 1 g, 1 g, Oral, Daily, Cristi Loron, MD, 1 g at 07/11/11 1043 ondansetron (ZOFRAN) injection 4 mg, 4 mg, Intravenous, Q4H PRN, Cristi Loron, MD, 4 mg at 07/04/11 0939;  oxyCODONE-acetaminophen (PERCOCET) 5-325 MG per tablet 1-2 tablet, 1-2 tablet, Oral, Q4H PRN, Cristi Loron, MD, 1 tablet at 07/11/11 1043;  pantoprazole (PROTONIX) EC tablet 40 mg, 40 mg, Oral, Q1200, Cristi Loron, MD, 40 mg at 07/10/11 1211 phenol (CHLORASEPTIC) mouth spray 1 spray, 1 spray, Mouth/Throat, PRN, Cristi Loron, MD, 1 spray at 07/08/11 2324;  simvastatin (ZOCOR) tablet 5 mg, 5 mg, Oral, Q1200, Cristi Loron, MD;  zolpidem [facility]) tablet 5 mg, 5 mg, Oral, QHS PRN, Cristi Loron, MD;  DISCONTD: simvastatin (ZOCOR) tablet 5 mg, 5 mg, Oral, q1800, Cristi Loron, MD, 5 mg at 07/09/11 1740  Additional Precautions/Restrictions: Precautions Precautions: Back Precaution Comments: With verbal cues pt able to  recall 3/3 back precautions.  Required Braces or Orthoses: Yes Spinal Brace: Lumbar corset;Applied in sitting position Restrictions Weight Bearing Restrictions: No  Therapy Assessments Physical Therapy: Precautions Precautions: Back Precaution Comments: With verbal cues pt able to recall 3/3 back precautions.  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 and demonstrational cues not to twist while standing at sink, inst. pt. to push walker up to the sink, and stand inside of it Where Assessed - Grooming: Standing at sink Toilet Transfer: Performed;Minimal assistance Toilet Transfer Details (indicate cue type and reason): Mod assist for steadying and to manuever RW. VC for sequencing and safe hand placement during descent.  Transferred from bed to chair. Toilet Transfer Method: Proofreader: Raised toilet seat with arms (or 3-in-1 over toilet) Toileting - Clothing Manipulation: Performed;Minimal assistance Where Assessed - Toileting Clothing Manipulation: Sit to stand from 3-in-1 or toilet Toileting - Hygiene: Performed;Minimal assistance Toileting - Hygiene Details (indicate cue type and reason): cues for maintaining back precautions Where Assessed - Toileting Hygiene: Sit to stand from 3-in-1 or toilet Equipment Used: Rolling walker Ambulation Related to ADLs: cues for walker safety and maintaining back precautions ADL Comments: reviewed with pt. 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 and demonstrational cues not to twist while standing at sink, inst. pt. to push walker up to the sink, and stand inside of it Where Assessed - Grooming: Standing at sink Toilet Transfer: Performed;Minimal assistance Toilet Transfer Details (indicate cue type and reason): Mod assist for steadying and to manuever RW. VC for sequencing and safe hand placement during descent.  Transferred from bed to chair. Toilet Transfer Method: Proofreader: Raised toilet seat with arms (or 3-in-1 over toilet) Toileting - Clothing Manipulation: Performed;Minimal assistance Where Assessed - Toileting Clothing Manipulation: Sit to stand from 3-in-1 or toilet Toileting - Hygiene: Performed;Minimal assistance Toileting - Hygiene Details (indicate cue type and reason): cues for maintaining back precautions Where Assessed - Toileting Hygiene: Sit to stand  from 3-in-1 or toilet Equipment Used: Rolling walker Ambulation Related to ADLs: cues for walker safety and maintaining back precautions ADL Comments: reviewed with pt. and family methods for toilet hygiene while maintaining back precautions  Additional Prior Functional Levels:  Bed Mobility: I Transfers: I Mobility - Walk/Wheelchair: I Upper Body Dressing: I Lower Body Dressing: I Grooming: I Eating/Drinking: I Toilet Transfer: I Bladder Continence: WNL Bowel Management: WNL Stair Climbing: I Communication: WNL Memory: WNL Cooking/Meal Prep: I Housework: I Money Management: I Driving: yes  Prior Activity Level: Community (5-7x/wk): Worked 4-5 days a week and went to the gym  ADLs/Mobility: ADL Grooming: Performed;Wash/dry hands;Minimal assistance Grooming Details (indicate cue type and reason): inst. and demonstrational cues not to twist while standing at sink, inst. pt. to push walker up to the sink, and stand inside of it Where Assessed - Grooming: Standing at sink Toilet  Transfer: Performed;Minimal assistance Toilet Transfer Details (indicate cue type and reason): Mod assist for steadying and to manuever RW. VC for sequencing and safe hand placement during descent.  Transferred from bed to chair. Toilet Transfer Method: Proofreader: Raised toilet seat with arms (or 3-in-1 over toilet) Toileting - Clothing Manipulation: Performed;Minimal assistance Where Assessed - Toileting Clothing Manipulation: Sit to stand from 3-in-1 or toilet Toileting - Hygiene: Performed;Minimal assistance Toileting - Hygiene Details (indicate cue type and reason): cues for maintaining back precautions Where Assessed - Toileting Hygiene: Sit to stand from 3-in-1 or toilet Equipment Used: Rolling walker Ambulation Related to ADLs: cues for walker safety and maintaining back precautions ADL Comments: reviewed with pt. and family methods for toilet hygiene while maintaining back precautions  Bed Mobility Bed Mobility: No Rolling Right: With rail;4: Min assist Rolling Right Details (indicate cue type and reason): Verbal cues for log rolling, bending knees prior to roll (pt initiating with trunk rotation).  Rolling Left: 3: Mod assist;With rail Rolling Left Details (indicate cue type and reason): Verbal/tactile cues for sequencing, flexion of bil LEs and to maintain log roll.  Right Sidelying to Sit: With rails;HOB flat;4: Min assist Right Sidelying to Sit Details (indicate cue type and reason): (A) to upright trunk, (A) required for bil. LE advancement and positioning. Verbal cues for precautions Left Sidelying to Sit: 3: Mod assist;HOB elevated (comment degrees) Left Sidelying to Sit Details (indicate cue type and reason): Verbal/tactile cues for sequencing, assist to upright trunk.  Transfers Transfers: Yes Sit to Stand: 4: Min assist;From chair/3-in-1 Sit to Stand Details (indicate cue type and reason): (A) to initiate transfer with max cues for hand placement.   Pt tends to keep hands on RW. Stand to Sit: 4: Min assist;With upper extremity assist;To chair/3-in-1 Stand to Sit Details: Heavy min assist from bed, less assist required from chair with arm rests. Pt has most difficulty getting UEs from support surface to RW and has to "jump" hands to RW demonstrating weakness and unsafe ability to transfer.  Stand Pivot Transfers: 3: Mod assist Stand Pivot Transfer Details (indicate cue type and reason): With rolling walker. Good foot clearance with pivotal stepping initially then declined in balance and tendency to shuffle feet. Approx 2 feet to chair. Ambulation/Gait Ambulation/Gait: Yes Ambulation/Gait Assistance: 4: Min assist Ambulation/Gait Assistance Details (indicate cue type and reason): Assist to promote improved balance and negotiate RW in room. Verbal cues for safe distancing of RW, pt tends to get RW too far ahead of self.  Ambulation Distance (Feet): 50 Feet (Total: 25 then 25) Assistive device: Rolling walker Gait Pattern:  Shuffle;Step-to pattern;Step-through pattern;Decreased stride length Gait velocity: Pt with frequent stops during ambulation Stairs: No Wheelchair Mobility Wheelchair Mobility: No Balance Balance Assessed: Yes Static Sitting Balance Static Sitting - Balance Support: Bilateral upper extremity supported;Feet supported Static Sitting - Level of Assistance: 5: Stand by assistance Static Sitting - Comment/# of Minutes: ~ 5 minutes to donn brace with total (A)  Home Assistive Devices/Equipment:  Home Assistive Devices/Equipment Home Assistive Devices/Equipment: CBG Meter;Eyeglasses  Discharge Planning:  Living Arrangements: Spouse/significant other Support Systems: Spouse/significant other;Children Do you have any problems obtaining your medications?: No Type of Residence: Private residence Home Care Services: No Patient expects to be discharged to::  (HOME)  Previous Home Environment:  Living Arrangements:  Spouse/significant other Support Systems: Spouse/significant other;Children Do you have any problems obtaining your medications?: No Type of Residence: Private residence Home Care Services: No Patient expects to be discharged to::  (HOME)  Discharge Living Setting:  Plans for Discharge Living Setting: Patient's home;House;Lives with (comment) (Lives with wife) Discharge Living Setting Number of Levels: 1 Discharge Living Setting Number of Steps: 1 (1 step at garage entry) Discharge Living Setting is Bedroom on Main Floor?: Yes Discharge Living Setting is Bathroom on Main Floor?: Yes  Social/Family/Support Systems:  Patient Roles: Spouse;Parent Contact Information: Nazeer Romney (h) [phone] (c) [phone] Synetta Fail [place] - Dtr [phone]) Anticipated Caregiver: Tyler Aas - wife Ability/Limitations of Caregiver: Wife can assist Caregiver Availability: 24/7 Discharge Plan Discussed with Primary Caregiver: Yes Is Caregiver In Agreement with Plan?: Yes Does Caregiver/Family have Issues with Lodging/Transportation while Pt is in Rehab?: No  Goals/Additional Needs:  Patient/Family Goal for Rehab: PT/OT mod I goals, except min A with brace (ELOS = 7-10 days) Cultural Considerations: [employer] Dietary Needs: Diet as tolerated Equipment Needs: TBD Pt/Family Agrees to Admission and willing to participate: Yes Program Orientation Provided & Reviewed with Pt/Caregiver Including Roles  & Responsibilities: Yes  Preadmission Screen Completed By:  Trish Mage, 07/11/2011 12:21 PM  Patient's condition:  This patient's condition remains as documented in the Consult dated 07/10/11, in which the Rehabilitation Physician determined and documented that the patient's condition is appropriate for intensive rehabilitative care in an inpatient rehabilitation facility.  Preadmission Screen Competed by: Roderic Palau, RN, Time/Date,1232/07/10/13.  Discussed status with Dr. Riley Kill on 07/11/11 at 1235 (time/date) and  received telephone approval for admission today.  Admission Coordinator:  Trish Mage, time1235/Date01/29/13

## 2011-07-11 NOTE — H&P (Signed)
Physical Medicine and Rehabilitation Admission H&P  Chief complaint on file.  HPI: Curtis Frazier is an 70 y.o.right-handed male with history of rheumatoid arthritis and chronic back pain radiating lower extremities. Recent lumbar MRI and myelogram showed significant thoracic lumbar scoliosis with degenerative changes and radiculopathy. Underwent decompressive laminectomy lumbar L. 12, lumbar L2-3, lumbar 4-5 as well as posterior segmental thoracic T. 10 lumbar L5 pedicle screws and rods as well as thoracic T10-11, and thoracic T11-12, T12-L1, lumbar L1-2, 2-3, 3-4, C4-5 posterior lateral arthrodesis per Dr. Jenkins January 21. Fitted with a back brace to be donned when supine. Postoperative anemia 7.5 and transfused on January 25 with latest hemoglobin 8.9. New onset atrial fibrillation with followup of [facility] cardiology services. Echocardiogram with ejection fraction 65% no wall motion abnormalities. Venous Doppler studies negative for deep vein thrombosis. Initially placed on Amiodarone which is since been discontinued as patient converted to sinus rhythm and is holding sinus.. Patient remains on Toprol 25 mg daily. Cardiac panel negative.  Currently patient with SOB requiring 4L O2.  Last CXR with BLL opacities.  Has had urinary retention requiring I & O cath's. Patient continues with weakness and poor safety with mobility.   @ROS@ Past Medical History  Diagnosis Date  . Other and unspecified hyperlipidemia   . Coronary atherosclerosis of unspecified type of vessel, native or graft   . Personal history of colonic polyps   . Actinic keratosis   . Psoriatic arthropathy   . Other psoriasis   . Scoliosis (and kyphoscoliosis), idiopathic   . GI bleed 12/10    cecam AVM  . Gastritis 12/10  . Hyperlipidemia     takes Pravastatin daily  . Hypertension     takes Metoprolol daily  . Myocardial infarction 2010  . Shortness of breath     with exertion  . Pneumonia     hx of-2008  . Headache    occasionally  . Joint pain   . Joint swelling   . Rheumatoid arthritis     takes Methotrexate 7pills weekly  . Degeneration of intervertebral disc, site unspecified   . Chronic back pain     scoliosis/stenosis/radiculopathy,degenerative disc disease  . Psoriasis   . Bruises easily   . Esophageal reflux     takes Omeprazole daily  . History of colonic polyps   . Hemorrhoids   . Kidney stone     hx of  . Blood transfusion 2010  . Type II or unspecified type diabetes mellitus without mention of complication, not stated as uncontrolled     type II;controlled by diet and exercise   Past Surgical History  Procedure Date  . Rotator cuff repair 2000    left   . Colonoscopy   . Vasectomy 1979  . Ptca stent 02/2009    has one stent  . Carotid doppler 10/12    0-39% R and 60-79% left   . Cataract surgery 2012  . Cardiac catheterization 2010  . Lithotripsy 2009   Family History  Problem Relation Age of Onset  . Coronary artery disease Mother   . Diabetes Mother   . Heart disease Mother     CAD  . Coronary artery disease Sister   . Hypertension Sister   . Kidney failure Sister   . Heart disease Sister     CAD  . Pancreatic cancer Brother   . Diabetes Brother   . Prostate cancer Brother   . Cancer Brother     pancreatic CA  .   Diabetes Brother   . Anesthesia problems Neg Hx   . Hypotension Neg Hx   . Malignant hyperthermia Neg Hx   . Pseudochol deficiency Neg Hx    Social History:  reports that he has quit smoking. He does not have any smokeless tobacco history on file. He reports that he does not drink alcohol or use illicit drugs.   Allergies  Allergen Reactions  . Penicillins     REACTION: rash, swelling  . Tetracycline     REACTION: rash, swelling   Hospital medications:    . aspirin EC  81 mg Oral Daily  . cholecalciferol  1,000 Units Oral Daily  . docusate sodium  100 mg Oral BID  . folic acid  1 mg Oral Daily  . insulin aspart  0-15 Units Subcutaneous  TID WC  . insulin aspart  0-5 Units Subcutaneous QHS  . insulin aspart  4 Units Subcutaneous TID WC  . methotrexate  17.5 mg Oral Weekly  . metoprolol succinate  25 mg Oral Daily  . omega-3 acid ethyl esters  1 g Oral Daily  . pantoprazole  40 mg Oral Q1200  . simvastatin  5 mg Oral q1800   Medications Prior to Admission  Medication Sig Dispense Refill  . aspirin 81 MG tablet Take 81 mg by mouth daily.        . Cholecalciferol 1000 UNITS capsule Take 1,000 Units by mouth daily.        . CLOBETASOL PROPIONATE E 0.05 % emollient cream Apply 1 application topically 2 (two) times daily as needed. For skin rash.      . clopidogrel (PLAVIX) 75 MG tablet Take 1 tablet (75 mg total) by mouth daily.  30 tablet  6  . fish oil-omega-3 fatty acids 1000 MG capsule Take 1 g by mouth daily.       . FOLIC ACID PO Take 1 tablet by mouth daily.       . metoprolol succinate (TOPROL-XL) 25 MG 24 hr tablet Take 1 tablet (25 mg total) by mouth daily.  90 tablet  3  . multivitamin (THERAGRAN) per tablet Take 1 tablet by mouth daily.        . omeprazole (PRILOSEC) 40 MG capsule Take 40 mg by mouth daily.        . pravastatin (PRAVACHOL) 10 MG tablet Take 1 tablet (10 mg total) by mouth daily.  30 tablet  11  . diazepam (VALIUM) 5 MG tablet Take 1 tablet (5 mg total) by mouth every 6 (six) hours as needed.  50 tablet  1  . nitroGLYCERIN (NITROSTAT) 0.4 MG SL tablet Place 0.4 mg under the tongue every 5 (five) minutes as needed. For chest pain.       . oxyCODONE-acetaminophen (PERCOCET) 5-325 MG per tablet Take 1-2 tablets by mouth every 4 (four) hours as needed.  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  Rolling Left: 3: Mod assist;With rail Rolling Left Details (indicate cue type and reason): Verbal/tactile cues for sequencing, flexion of bil LEs and to maintain log roll.  Right Sidelying to Sit: With rails;HOB flat;4: Min assist Right Sidelying to Sit Details (indicate cue type and reason): (A) to upright trunk, (A) required for bil. LE advancement and positioning. Verbal cues for precautions Left Sidelying to Sit: 3: Mod assist;HOB elevated (comment degrees) Left Sidelying to Sit Details (indicate cue type and reason): Verbal/tactile cues for sequencing, assist to upright trunk.  Transfers Transfers: Yes Sit to Stand: 4: Min assist;From chair/3-in-1 Sit to Stand Details (indicate cue type and reason): (A) to initiate transfer with max cues for hand placement.  Pt tends to keep hands on RW. Stand to Sit: 4: Min assist;With upper extremity assist;To chair/3-in-1 Stand to Sit Details: Heavy min assist from bed, less assist required from chair with arm rests. Pt has most difficulty getting UEs from support surface to RW and has to "jump" hands to RW demonstrating weakness and unsafe ability to transfer.  Stand Pivot Transfers: 3: Mod assist Stand Pivot Transfer Details (indicate cue type and reason): With rolling walker. Good foot clearance with pivotal stepping initially then declined in balance and tendency to shuffle feet. Approx 2 feet to chair. Ambulation/Gait Ambulation/Gait: Yes Ambulation/Gait Assistance: 4: Min  assist Ambulation/Gait Assistance Details (indicate cue type and reason): Assist to promote improved balance and negotiate RW in room. Verbal cues for safe distancing of RW, pt tends to get RW too far ahead of self.  Ambulation Distance (Feet): 50 Feet (Total: 25 then 25) Assistive device: Rolling walker Gait Pattern: Shuffle;Step-to pattern;Step-through pattern;Decreased stride length Gait velocity: Pt with frequent stops during ambulation Stairs: No Wheelchair Mobility Wheelchair Mobility: No  ADL: ADL Grooming: Performed;Wash/dry hands;Minimal assistance Grooming Details (indicate cue type and reason): inst. and demonstrational cues not to twist while standing at sink, inst. pt. to push walker up to the sink, and stand inside of it Where Assessed - Grooming: Standing at sink Toilet Transfer: Performed;Minimal assistance Toilet Transfer Details (indicate cue type and reason): Mod assist for steadying and to manuever RW. VC for sequencing and safe hand placement during descent.  Transferred from bed to chair. Toilet Transfer Method: Ambulating Toilet Transfer Equipment: Raised toilet seat with arms (or 3-in-1 over toilet) Toileting - Clothing Manipulation: Performed;Minimal assistance Where Assessed - Toileting Clothing Manipulation: Sit to stand from 3-in-1 or toilet Toileting - Hygiene: Performed;Minimal assistance Toileting - Hygiene Details (indicate cue type and reason): cues for maintaining back precautions Where Assessed - Toileting Hygiene: Sit to stand from 3-in-1 or toilet Equipment Used: Rolling walker Ambulation Related to ADLs: cues for walker safety and maintaining back precautions ADL Comments: reviewed with pt. and family methods for toilet hygiene while maintaining back precautions  Cognition: Cognition Arousal/Alertness: Awake/alert Orientation Level: Oriented X4 Cognition Arousal/Alertness: Awake/alert Overall Cognitive Status: Appears within functional limits  for tasks assessed Orientation Level: Oriented X4   Blood pressure 136/71, pulse 71, temperature 98.7 F (37.1 C), temperature source Oral, resp. rate 19, height 5' 8" (1.727 m), weight 90.1 kg (198 lb 10.2 oz), SpO2 95.00%. PHYSICAL EXAM Pt is alert and oriented x3.  A little more quiet and flat then yesterday.  Mucosa pink and moist.  Dentition is fair.  Neck is supple without jvd or lymphyadenopathy. hrt is RRR without MRG.  Chest is clear except   for some mild rales along the bases.  Air movement good.  Abdomen is soft nontender and non distended.  Bowel sounds are positive.  Wound is clean and well approximated with minimal drainage.  Scar tissue noted around wound.  No swelling is seen.   Neuro:  CN exam intact.  Cognition is appropriate. Motor exam is 4 to 4+ in ue's and 3+ proximately to 4/5 distally.  No sensory deficits seen.  DTR's 1+.   Skin:grossly intact except for op site. CBG (last 3)   Basename 07/11/11 0825 07/11/11 0634 07/10/11 2134  GLUCAP 189* 150* 149*   Lab Results  Component Value Date   WBC 8.4 07/08/2011   HGB 8.9* 07/08/2011   HCT 25.9* 07/08/2011   MCV 91.2 07/08/2011   PLT 160 07/08/2011   Lab Results  Component Value Date   HGBA1C 7.0* 11/18/2010  BMET    Component Value Date/Time   NA 135 07/11/2011 0550   K 3.9 07/11/2011 0550   CL 101 07/11/2011 0550   CO2 26 07/11/2011 0550   GLUCOSE 163* 07/11/2011 0550   BUN 13 07/11/2011 0550   CREATININE 1.00 07/11/2011 0550   CALCIUM 8.9 07/11/2011 0550   GFRNONAA 74* 07/11/2011 0550   GFRAA 85* 07/11/2011 0550     .No results found.  Post Admission Physician Evaluation: 1. Functional deficits secondary  to severe lumbar stenosis with radiculopathy. 2. Patient is admitted to receive collaborative, interdisciplinary care between the physiatrist, rehab nursing staff, and therapy team. 3. Patient's level of medical complexity and substantial therapy needs in context of that medical necessity cannot be provided at a  lesser intensity of care such as a SNF. 4. Patient has experienced substantial functional loss from his/her baseline which was documented above under the "Functional History" and "Functional Status" headings.  Judging by the patient's diagnosis, physical exam, and functional history, the patient has potential for functional progress which will result in measurable gains while on inpatient rehab.  These gains will be of substantial and practical use upon discharge  in facilitating mobility and self-care at the household level. 5. Physiatrist will provide 24 hour management of medical needs as well as oversight of the therapy plan/treatment and provide guidance as appropriate regarding the interaction of the two. 6. 24 hour rehab nursing will assist with bladder management, bowel management, safety, skin/wound care, disease management, medication administration, pain management and patient education  and help integrate therapy concepts, techniques,education, etc. 7. PT will assess and treat for:  Lower extremity strength, ROM, adaptive equipment, back precautions, safety, gait and transfers.  Goals are: modified independent to supervision. 8. OT will assess and treat for: UES, ADL's, safety, fxnl mobility, adaptive equipment training, don/doffing of brace.   Goals are: modified independent to set up. 9. SLP will assess and treat for:  10. Case Management and Social Worker will assess and treat for psychological issues and discharge planning. 11. Team conference will be held weekly to assess progress toward goals and to determine barriers to discharge. 12.  Patient will receive at least 3 hours of therapy per day at least 5 days per week. 13. ELOS and Prognosis: 7-10 days excellent   Medical Problem List and Plan: 1. DVT Prophylaxis/Anticoagulation: Mechanical: Sequential compression devices, below knee Bilateral lower extremities  2. Pain Management: prn oxycodone, tylenol, robaxin.  May utilize heat  for muscle spasms as long as not on wound.  3. Mood: provide ego support as needed.  Has been fairly upbeat.  4.DM type   2:  Diet controlled.  Currently elevated due to stress of surgery, pain, etc. Monitor with ac/hs cbg checks.  May need oral agent as last Hbg AIC @ 7.0.  Will monitor for now.  5. HTN: monitor with bid checks. Pain control important for bp control also.  6. PAF:  Post op  arrythmia resolved. Continue toprol 25 mg daily as at home and follow closely as we push him physically with therapies. In NSR today  7. RA:  On methotrexate weekly with folic acid for supplement.  8. Dyslipidemia:  Continue daily zocor.    Zach Swartz, MD 07/11/11 

## 2011-07-11 NOTE — Plan of Care (Signed)
Problem: SCI BOWEL ELIMINATION Goal: RH STG MANAGE BOWEL WITH ASSISTANCE STG Manage Bowel with Assistance. IMod ndependent Goal: RH STG SCI MANAGE BOWEL WITH MEDICATION WITH ASSISTANCE Mod independent Goal: RH STG MANAGE BOWEL W/EQUIPMENT W/ASSISTANCE STG Manage Bowel With Equipment With Assistance Mod. Independent  Problem: RH SKIN INTEGRITY Goal: RH STG SKIN FREE OF INFECTION/BREAKDOWN Min assist Goal: RH STG MAINTAIN SKIN INTEGRITY WITH ASSISTANCE STG Maintain Skin Integrity With Assistance. Min assist Goal: RH STG ABLE TO PERFORM INCISION/WOUND CARE W/ASSISTANCE STG Able To Perform Incision/Wound Care With Assistance. Max assist  Problem: RH SAFETY Goal: RH STG ADHERE TO SAFETY PRECAUTIONS W/ASSISTANCE/DEVICE STG Adhere to Safety Precautions With Assistance/Device. Mod Independent Goal: RH STG DECREASED RISK OF FALL WITH ASSISTANCE STG Decreased Risk of Fall With Assistance. Mod independent  Problem: RH PAIN MANAGEMENT Goal: RH STG PAIN MANAGED AT OR BELOW PT'S PAIN GOAL Managed 2/10

## 2011-07-12 DIAGNOSIS — M47817 Spondylosis without myelopathy or radiculopathy, lumbosacral region: Secondary | ICD-10-CM

## 2011-07-12 DIAGNOSIS — D62 Acute posthemorrhagic anemia: Secondary | ICD-10-CM

## 2011-07-12 DIAGNOSIS — IMO0002 Reserved for concepts with insufficient information to code with codable children: Secondary | ICD-10-CM

## 2011-07-12 DIAGNOSIS — I4891 Unspecified atrial fibrillation: Secondary | ICD-10-CM

## 2011-07-12 DIAGNOSIS — Z5189 Encounter for other specified aftercare: Secondary | ICD-10-CM

## 2011-07-12 LAB — CBC
HCT: 30 % — ABNORMAL LOW (ref 39.0–52.0)
Hemoglobin: 10.2 g/dL — ABNORMAL LOW (ref 13.0–17.0)
MCH: 32.1 pg (ref 26.0–34.0)
MCV: 94.3 fL (ref 78.0–100.0)
RBC: 3.18 MIL/uL — ABNORMAL LOW (ref 4.22–5.81)

## 2011-07-12 LAB — COMPREHENSIVE METABOLIC PANEL
ALT: 52 U/L (ref 0–53)
BUN: 13 mg/dL (ref 6–23)
CO2: 24 mEq/L (ref 19–32)
Calcium: 8.8 mg/dL (ref 8.4–10.5)
GFR calc Af Amer: 83 mL/min — ABNORMAL LOW (ref >90)
GFR calc non Af Amer: 72 mL/min — ABNORMAL LOW (ref >90)
Glucose, Bld: 166 mg/dL — ABNORMAL HIGH (ref 70–99)
Sodium: 135 mEq/L (ref 135–145)

## 2011-07-12 LAB — GLUCOSE, CAPILLARY: Glucose-Capillary: 114 mg/dL — ABNORMAL HIGH (ref 70–99)

## 2011-07-12 LAB — DIFFERENTIAL
Basophils Absolute: 0.1 10*3/uL (ref 0.0–0.1)
Lymphs Abs: 2.3 10*3/uL (ref 0.7–4.0)
Monocytes Absolute: 1.2 10*3/uL — ABNORMAL HIGH (ref 0.1–1.0)
Monocytes Relative: 12 % (ref 3–12)
Neutrophils Relative %: 61 % (ref 43–77)

## 2011-07-12 MED ORDER — ASPIRIN 81 MG PO CHEW
CHEWABLE_TABLET | ORAL | Status: AC
  Filled 2011-07-12: qty 1

## 2011-07-12 NOTE — Evaluation (Signed)
Occupational Therapy Assessment and Plan  Patient Details  Name: Curtis Frazier MRN: [id number] Date of Birth: [birth year]  OT Diagnosis: acute pain, lumbago (low back pain) and muscle weakness (generalized) Rehab Potential: Rehab Potential: Excellent ELOS:   7days  Today's Date: 07/12/2011 Time: 4098-1191 Time Calculation (min): 58 min  Assessment & Plan Clinical Impression: Patient is a 71 y.o. year old male with recent admission to the hospital on 07/03/11 with increased back pain and need for lumbar/thoracic fusion.  Patient transferred to CIR on 07/11/2011 .  Patient's past medical history is significant for ( see PMH in history section).    Patient currently requires min with basic self-care skills secondary to muscle weakness.  Prior to hospitalization, patient could complete ADLs independently and was working full time as a [employer] fundraiser.  Patient will benefit from skilled intervention to increase independence with basic self-care skills prior to discharge wife.  Anticipate patient will require intermittent supervision and no further OT follow recommended.  OT - End of Session Activity Tolerance: Improving Endurance Deficit: Yes Endurance Deficit Description: Pt only able to tolerate limited standing activity also due to weakness in his LEs and back. OT Assessment Rehab Potential: Excellent Barriers to Discharge: None OT Plan OT Frequency: 1-2 X/day, 60-90 minutes OT Treatment/Interventions: Balance/vestibular training;DME/adaptive equipment instruction;Patient/family education;UE/LE Strength taining/ROM;Therapeutic Activities;Functional mobility training;Self Care/advanced ADL retraining OT Recommendation Follow Up Recommendations: None Equipment Recommended: 3 in 1 bedside comode  Precautions/Restrictions  Precautions Precautions: Back Precaution Booklet Issued: No Precaution Comments: Corsett can be removed for showering. Required Braces or Orthoses: Yes Spinal Brace:  Lumbar corset;Applied in sitting position Restrictions Weight Bearing Restrictions: No l  Pain Pain Assessment Pain Score:   2 Pain Type: Surgical pain Pain Location: Back Pain Orientation: Lower;Right Pain Descriptors: Cramping Pain Intervention(s): Medication (See eMAR);Repositioned;Distraction  Vision/Perception    Vision is WFLs.  Pt does wear glasses most of the time for visual acuity.  Cognition Overall Cognitive Status: Appears within functional limits for tasks assessed Arousal/Alertness: Awake/alert Orientation Level: Oriented X4 Attention: Sustained Sustained Attention: Appears intact Memory: Appears intact Awareness: Appears intact Problem Solving: Appears intact Safety/Judgment: Appears intact Sensation Sensation Light Touch: Appears Intact Stereognosis: Appears Intact Hot/Cold: Appears Intact Proprioception: Appears Intact Coordination Gross Motor Movements are Fluid and Coordinated: Yes Fine Motor Movements are Fluid and Coordinated: Yes Motor  Motor Motor: Within Functional Limits Mobility  Bed Mobility Bed Mobility: Yes Rolling Right: 5: Supervision;With rail Right Sidelying to Sit: 5: Supervision;With rails;HOB elevated (comment degrees) (HOB elevated approxmately 20 degrees.) Transfers Transfers: Yes Sit to Stand: 4: Min assist;From bed (From wheelchair.)  Trunk/Postural Assessment  Cervical Assessment Cervical Assessment: Within Functional Limits Thoracic Assessment Thoracic Assessment: Within Functional Limits Lumbar Strength Overall Lumbar Strength Comments: Pt needs use of assistive device to maintain lumbar extension in standing.  Balance Balance Balance Assessed: Yes Static Standing Balance Static Standing - Balance Support: Bilateral upper extremity supported Static Standing - Level of Assistance: 4: Min assist Extremity/Trunk Assessment RUE Assessment RUE Assessment: Within Functional Limits LUE Assessment LUE Assessment:  Within Functional Limits  Recommendations for other services: None  Discharge Criteria: Patient will be discharged from OT if patient refuses treatment 3 consecutive times without medical reason, if treatment goals not met, if there is a change in medical status, if patient makes no progress towards goals or if patient is discharged from hospital.  The above assessment, treatment plan, treatment alternatives and goals were discussed and mutually agreed upon: by patient and by family  Session 1 evaluation and treatment initiated with focus on selfcare retraining while maintaining back precautions and emphasizing safety.  Jonalyn Sedlak OTR/L 07/12/2011, 11:43 AM  Pager number [phone]

## 2011-07-12 NOTE — Progress Notes (Signed)
Physical Therapy Note  Patient Details  Name: Curtis Frazier MRN: [id number] Date of Birth: [birth year] Today's Date: 07/12/2011  Time: 1445-1525 40 minutes  No c/o pain.  W/c mobility with mod I in controlled environment.  Gait with stepping over obstacles min A with RW, manual facilitation at hips for stability.  Gait training controlled environment for strength and endurance 150' with close supervision - min A.  Standing TE 2 x 10 for B hip strength and endurance.  Nu step in appropriate range to keep back precautions x 5 minutes level 4 for LE strength and endurance.  Pt with improved activity tolerance this session.  Individual therapy   Jolonda Gomm 07/12/2011, 3:34 PM

## 2011-07-12 NOTE — Progress Notes (Signed)
Patient ID: Curtis Frazier, male   DOB: 06-21-1940, 71 y.o.   MRN: [id number] Subjective/Complaints: Review of Systems  Musculoskeletal: Positive for back pain and joint pain.  Psychiatric/Behavioral: The patient has insomnia.   All other systems reviewed and are negative.     Objective: Vital Signs: Blood pressure 103/63, pulse 78, temperature 98.8 F (37.1 C), temperature source Oral, resp. rate 17, SpO2 96.00%. No results found.  Basename 07/12/11 0501  WBC 9.9  HGB 10.2*  HCT 30.0*  PLT 406*    Basename 07/12/11 0501 07/11/11 0550  NA 135 135  K 3.7 3.9  CL 100 101  CO2 24 26  GLUCOSE 166* 163*  BUN 13 13  CREATININE 1.02 1.00  CALCIUM 8.8 8.9   CBG (last 3)   Basename 07/12/11 0720 07/11/11 2107 07/11/11 1707  GLUCAP 143* 136* 150*    Wt Readings from Last 3 Encounters:  07/09/11 90.1 kg (198 lb 10.2 oz)  07/09/11 90.1 kg (198 lb 10.2 oz)  06/23/11 80.151 kg (176 lb 11.2 oz)    Physical Exam:  General appearance: alert, cooperative and no distress Head: Normocephalic, without obvious abnormality, atraumatic Eyes: conjunctivae/corneas clear. PERRL, EOM's intact. Fundi benign. Ears: normal TM's and external ear canals both ears Nose: Nares normal. Septum midline. Mucosa normal. No drainage or sinus tenderness. Throat: lips, mucosa, and tongue normal; teeth and gums normal Neck: no adenopathy, no carotid bruit, no JVD, supple, symmetrical, trachea midline and thyroid not enlarged, symmetric, no tenderness/mass/nodules Back: symmetric, no curvature. ROM normal. No CVA tenderness. Resp: clear to auscultation bilaterally Cardio: regular rate and rhythm, S1, S2 normal, no murmur, click, rub or gallop GI: soft, non-tender; bowel sounds normal; no masses,  no organomegaly Extremities: extremities normal, atraumatic, no cyanosis or edema Pulses: 2+ and symmetric Skin: Skin color, texture, turgor normal. No rashes or lesions Neurologic: strength functional. No sensory  findings.  Incision/Wound: scant serosanginous drainage from back wound.  Well approximated 1/30 exam  Assessment/Plan: 1. 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 Physiatrist is providing close team supervision and 24 hour management of active medical problems listed below. Physiatrist and rehab team continue to assess barriers to discharge/monitor patient progress toward functional and medical goals. FIM:       FIM - Toileting Toileting: 0: Activity did not occur  FIM - Archivist Transfers: 0-Activity did not occur  FIM - Games developer Transfer: 0: Activity did not occur     Comprehension Comprehension Mode: Auditory Comprehension: 6-Follows complex conversation/direction: With extra time/assistive device  Expression Expression Mode: Verbal Expression: 6-Expresses complex ideas: With extra time/assistive device  Social Interaction Social Interaction: 7-Interacts appropriately with others - No medications needed.  Problem Solving Problem Solving: 5-Solves basic 90% of the time/requires cueing < 10% of the time  Memory Memory: 6-More than reasonable amt of time  1. DVT Prophylaxis/Anticoagulation: Mechanical: Sequential compression devices, below knee Bilateral lower extremities  2. Pain Management: prn oxycodone, tylenol, robaxin. May utilize heat for muscle spasms as long as not on wound. Consider pretreating with pain meds for better activity tolerance 3. Mood: provide ego support as needed. Has been fairly upbeat.  4.DM type 2: Diet controlled. Currently elevated due to stress of surgery, pain, etc. Monitor with ac/hs cbg checks. May need oral agent as last Hbg AIC @ 7.0. CBG's not too bad for now.  Will not add an oral agent at this time 5. HTN: monitor with bid checks.  Pain control important for bp control also.  6. PAF: Post op arrythmia resolved. Continue toprol 25 mg daily as at home and follow closely as we push him physically with therapies. In NSR today in 70's 7. RA: On methotrexate weekly with folic acid for supplement.  8. Dyslipidemia: Continue daily zocor.   LOS (Days) 1 A FACE TO FACE EVALUATION WAS PERFORMED  Uel Davidow T 07/12/2011, 8:58 AM

## 2011-07-12 NOTE — Progress Notes (Signed)
Patient information reviewed and entered into [facility] system by Adit Riddles, RN, CRRN, PPS Coordinator.  Information including medical coding and functional independence measure will be reviewed and updated through discharge.     Per nursing patient was given "Data Collection Information Summary for Patients in Inpatient Rehabilitation Facilities with attached "Privacy Act Statement-Health Care Records" upon admission.   

## 2011-07-12 NOTE — Progress Notes (Signed)
Occupational Therapy Session Note  Patient Details  Name: Curtis Frazier MRN: [id number] Date of Birth: [birth year]  Today's Date: 07/12/2011 Time: 8657-8469 Time Calculation (min): 29 min  Precautions: Precautions Precautions: Back Precaution Booklet Issued: No Precaution Comments: Corsett can be removed for showering. Required Braces or Orthoses: Yes Spinal Brace: Lumbar corset (when OOB) Restrictions Weight Bearing Restrictions: No  Short Term Goals: OT Short Term Goal 1: Short term goals are equal to long term goals set at modified independent level secondary to short length of stay.  Skilled Therapeutic Interventions/Progress Updates:    Session focused on AE education for LB selfcare and providing pt and spouse information on availability and cost.  Will need continued practice.  Also practiced toileting with use of a RW and lumbar corset.  Pt needs use of a grab bar and the RW for sit to stand from a low surface.     Pain Pain Assessment Pain Assessment: No/denies pain Pain Score:   4 Pain Type: Surgical pain Pain Location: Back Pain Intervention(s): RN made aware;Repositioned;Rest ADL  SEE FIM section   Therapy/Group: Individual Therapy  Pollyann Roa OTR/L 07/12/2011, 3:41 PM Pager number [phone]

## 2011-07-12 NOTE — Evaluation (Signed)
Physical Therapy Assessment and Plan  Patient Details  Name: Curtis Frazier MRN: [id number] Date of Birth: 26-Oct-1940  PT Diagnosis: Difficulty walking, Muscle weakness and Pain in  back Rehab Potential: Good ELOS: 7 days   Today's Date: 07/12/2011 Time: 800-900 60 minutes Assessment & Plan Clinical Impression: Patient is a 71 y.o. right-handed male with history of rheumatoid arthritis and chronic back pain radiating lower extremities. Recent lumbar MRI and myelogram showed significant thoracic lumbar scoliosis with degenerative changes and radiculopathy. Underwent decompressive laminectomy lumbar L. 12, lumbar L2-3, lumbar 4-5 as well as posterior segmental thoracic T. 10 lumbar L5 pedicle screws and rods as well as thoracic T10-11, and thoracic T11-12, T12-L1, lumbar L1-2, 2-3, 3-4, C4-5 posterior lateral arthrodesis per Dr. Lovell Sheehan January 21.  Patient transferred to CIR on 07/11/2011 .  Patient's past medical history is significant for scoliosis, gastritis, MI, pneumonia, rheumatoid arthritis, DM .    Patient currently requires min with mobility secondary to muscle weakness and decreased cardiorespiratoy endurance.  Prior to hospitalization, patient was independent with mobility and lived with Spouse in a House home.  Home access is 1Stairs to enter.  Patient will benefit from skilled PT intervention to maximize safe functional mobility, minimize fall risk and decrease caregiver burden for planned discharge home with intermittent assist.  Anticipate patient will benefit from follow up OP at discharge.  PT - End of Session Activity Tolerance: Tolerates 30+ min activity with multiple rests Endurance Deficit: Yes Endurance Deficit Description: frequent rests PT Assessment Rehab Potential: Good PT Plan PT Frequency: 1-2 X/day, 60-90 minutes Estimated Length of Stay: 7 days PT Treatment/Interventions: Warden/ranger;Ambulation/gait training;DME/adaptive equipment  instruction;Patient/family education;Pain management;Functional mobility training;Stair training;Therapeutic Activities;Therapeutic Exercise;UE/LE Coordination activities;UE/LE Strength taining/ROM PT Recommendation Follow Up Recommendations: Outpatient PT Equipment Recommended: Rolling walker with 5" wheels  Precautions/Restrictions Precautions Precautions: Back Required Braces or Orthoses: Yes Spinal Brace: Lumbar corset (when OOB) Restrictions Weight Bearing Restrictions: No  Vital Signs  HR 115-120 throughout session.  O2 90%-95% throughout session on room air. 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 Status: Appears within functional limits for tasks assessed Sensation Sensation Light Touch: Appears Intact Proprioception: Appears Intact Motor  Motor Motor: Within Functional Limits  Mobility Bed Mobility Rolling Right: 5: Supervision Rolling Right Details (indicate cue type and reason): cues for log roll technique Right Sidelying to Sit: 5: Supervision Right Sidelying to Sit Details (indicate cue type and reason): increased time, cues for hand placement Transfers Sit to Stand: 4: Min assist Sit to Stand Details (indicate cue type and reason): cues for hand placement Stand Pivot Transfers: 4: Min assist Stand Pivot Transfer Details (indicate cue type and reason):  with RW Locomotion  Ambulation Ambulation/Gait Assistance: 4: Min assist Ambulation Distance (Feet): 15 Feet Assistive device: None Ambulation/Gait Assistance Details (indicate cue type and reason): assist for wt shift, trunk stability, needed HHA when fatigued and for turns Stairs / Additional Locomotion Stairs: Yes Stairs Assistance: 4: Min assist Stairs Assistance Details (indicate cue type and reason): cues for safe  technique Stair Management Technique: Two rails Number of Stairs: 5  Curb: 4: Min assist (with RW, cues for technique) Corporate treasurer: Yes Wheelchair Assistance: 5: Investment banker, operational: Both upper extremities Wheelchair Parts Management: Needs assistance Distance: 150  Trunk/Postural Assessment  Cervical Assessment Cervical Assessment: Within Functional Limits Thoracic Assessment Thoracic Assessment: Exceptions to [facility] (decreased ROM due to precautions) Lumbar Assessment Lumbar Assessment:  (decreased ROM due to precautions, brace) Postural Control Postural Control:  (needs manual cues to tighten core with mobility)  Balance Static Sitting Balance Static Sitting - Level of Assistance: 6: Modified independent (Device/Increase time) Static Standing Balance Static Standing - Level of Assistance: 4: Min assist (with UE support) Dynamic Standing Balance Dynamic Standing - Level of Assistance: 3: Mod assist (during B UE functional activity) Extremity Assessment      RLE Assessment RLE Assessment: Within Functional Limits (except R hip abduction 3-/5) LLE Assessment LLE Assessment: Within Functional Limits  Recommendations for other services: None  Discharge Criteria: Patient will be discharged from PT if patient refuses treatment 3 consecutive times without medical reason, if treatment goals not met, if there is a change in medical status, if patient makes no progress towards goals or if patient is discharged from  hospital.  The above assessment, treatment plan, treatment alternatives and goals were discussed and mutually agreed upon: by patient  Treatment initiated during session Gait with RW multiple attempts with min A, facilitation at hips for stability in R hip during stance.  Stair training with 1 handrail, 5 steps with min A, cues for sequencing.  Dynamic gait with RW with min A, no LOB.  Supine TE for LE strength and endurance 2 x 10 B LEs.  Cleveland Yarbro 07/12/2011, 3:48 PM

## 2011-07-12 NOTE — Progress Notes (Signed)
Recreational Therapy Assessment and Plan  Patient Details  Name: Curtis Frazier MRN: [id number] Date of Birth: 1940/11/03  Rehab Potential: Good ELOS: 7 days  Assessment Clinical Impression: Met with pt and discussed leisure interests and community pursuits.  Discussed general safety, energy conservation as well as community reintegration.  Pt states understanding.  No formal TR due to ELOS.  Will continue to monitor.  No c/o pain.   Recreational Therapy Leisure History/Participation Premorbid leisure interest/current participation: Sports - Baseball;Community - Travel (Comment);Community - Journalist, newspaper - Engineer, civil (consulting) (Naval architect) Other Leisure Interests: Television Leisure Participation Style: With Family/Friends Awareness of Community Resources: Good-identify 3 post discharge leisure resources [employer] Social interaction - Mood/Behavior: Cooperative Recreational Therapy Orientation Orientation -Reviewed with patient: Available activity resources Strengths/Weaknesses Patient Strengths/Abilities: Active premorbidly Patient weaknesses: Physical limitations  Plan Rec Therapy Plan Is patient appropriate for Therapeutic Recreation?: No  Recommendations for other services: None  Discharge Criteria: Patient will be discharged from TR if patient refuses treatment 3 consecutive times without medical reason.  If treatment goals not met, if there is a change in medical status, if patient makes no progress towards goals or if patient is discharged from hospital.  The above assessment, treatment plan, treatment alternatives and goals were discussed and mutually agreed upon: by patient  Digna Countess 07/12/2011, 5:05 PM

## 2011-07-13 LAB — CULTURE, BLOOD (ROUTINE X 2)
Culture  Setup Time: 201301251358
Culture: NO GROWTH

## 2011-07-13 LAB — GLUCOSE, CAPILLARY: Glucose-Capillary: 136 mg/dL — ABNORMAL HIGH (ref 70–99)

## 2011-07-13 LAB — URINE CULTURE: Culture  Setup Time: 201301292112

## 2011-07-13 MED ORDER — OXYCODONE HCL 5 MG PO TABS: 5.0000 mg | ORAL_TABLET | ORAL | Status: AC | PRN

## 2011-07-13 NOTE — Progress Notes (Signed)
Note reviewed and accurately reflects treatment session.   

## 2011-07-13 NOTE — Progress Notes (Signed)
Social Work  Assessment and Plan  Patient Name: Curtis Frazier  HYQMV'H Date: 07/13/2011  Problem List:  Patient Active Problem List  Diagnoses  . DIABETES MELLITUS, TYPE II  . HYPERLIPIDEMIA  . HYPERKALEMIA  . ANEMIA-UNSPECIFIED  . ESSENTIAL HYPERTENSION, BENIGN  . C A D  . GERD  . ANGIODYSPLASIA-INTESTINE  . PSORIATIC ARTHRITIS  . PSORIASIS  . ACTINIC KERATOSIS  . ARTHRITIS, RHEUMATOID  . DEGENERATIVE DISC DISEASE  . SCOLIOSIS  . MURMUR  . CAROTID BRUIT  . HX, PERSONAL, COLONIC POLYPS  . TOBACCO ABUSE, HX OF  . New onset a-fib  . Aortic stenosis    Past Medical History:  Past Medical History  Diagnosis Date  . Other and unspecified hyperlipidemia   . Coronary atherosclerosis of unspecified type of vessel, native or graft   . Personal history of colonic polyps   . Actinic keratosis   . Psoriatic arthropathy   . Other psoriasis   . Scoliosis (and kyphoscoliosis), idiopathic   . GI bleed 12/10    cecam AVM  . Gastritis 12/10  . Hyperlipidemia     takes Pravastatin daily  . Hypertension     takes Metoprolol daily  . Myocardial infarction 2010  . Shortness of breath     with exertion  . Pneumonia     hx of-2008  . Headache     occasionally  . Joint pain   . Joint swelling   . Rheumatoid arthritis     takes Methotrexate 7pills weekly  . Degeneration of intervertebral disc, site unspecified   . Chronic back pain     scoliosis/stenosis/radiculopathy,degenerative disc disease  . Psoriasis   . Bruises easily   . Esophageal reflux     takes Omeprazole daily  . History of colonic polyps   . Hemorrhoids   . Kidney stone     hx of  . Blood transfusion 2010  . Type II or unspecified type diabetes mellitus without mention of complication, not stated as uncontrolled     type II;controlled by diet and exercise    Past Surgical History:  Past Surgical History  Procedure Date  . Rotator cuff repair 2000    left   . Colonoscopy   . Vasectomy 1979  . Ptca  stent 02/2009    has one stent  . Carotid doppler 10/12    0-39% R and 60-79% left   . Cataract surgery 2012  . Cardiac catheterization 2010  . Lithotripsy 2009    Discharge Planning  Discharge Planning Living Arrangements: Spouse/significant other Support Systems: Spouse/significant other;Children;Friends/neighbors Assistance Needed: min-mod Do you have any problems obtaining your medications?: No Type of Residence: Private residence Home Care Services: No Patient expects to be discharged to:: home Expected Discharge Date: 07/14/11 Case Management Consult Needed:  (following)  Social/Family/Support Systems Social/Family/Support Systems Patient Roles: Spouse;Parent Contact Information: Curtis Frazier (h) [phone] (c) [phone] Curtis Frazier - Dtr [phone]) Anticipated Caregiver: Curtis Frazier - wife Anticipated Caregiver's Contact Information: see above Ability/Limitations of Caregiver: Wife can assist Caregiver Availability: 24/7  Employment Status Employment Status Employment Status: Employed ("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) Name of Employer: [employer] Return to Work Plans: "as soon as I canRetail buyer Issues: none Guardian/Conservator: none  Abuse/Neglect Abuse/Neglect Assessment (Assessment to be complete while patient is alone) Physical Abuse: Denies Verbal Abuse: Denies Sexual Abuse: Denies Exploitation of patient/patient's resources: Denies Self-Neglect: Denies  Emotional Status Emotional Status Pt's affect, behavior adn adjustment status: pleasant, oriented  gentleman here after back surgery: no s/s depression or anxiety: denies any concerns about d/c and management at home Recent Psychosocial Issues: none Pyschiatric History: none  Patient/Family Perceptions, Expectations & Goals Pt/Family Perceptions, Expectations and Goals Pt/Family understanding of illness & functional limitations: pt and wife with good understanding of surgery  performed and current functional limitations and need for CIR Premorbid pt/family roles/activities: Both pt and wife continue to be active both at home and community Anticipated changes in roles/activities/participation: little change anticipated except on initial d/c home - wife may need to provide caregiver assistance Pt/family expectations/goals: Pt's goals are to be able to return to work  - "I can't stand to not do something"  [employer]s [employer] Stores: None Premorbid Home Care/DME Agencies: None Transportation available at discharge: yes  Discharge Visual merchandiser Resources: Administrator (specify) Herbalist) Financial Resources: Employment;Social Security Financial Screen Referred: No Living Expenses: Own Money Management: Patient;Spouse Home Management: pt and spouse Patient/Family Preliminary Plans: return home with wife who can provide 24/7 assistance, yet likely not needed  Clinical Impression:  Pleasant, oriented gentleman here on CIR following back surgery. Likely short LOS.  Good family support and no emotional distress noted.  Continue to follow.  Megan Salon, Veasna Santibanez 07/13/2011

## 2011-07-13 NOTE — Progress Notes (Signed)
Occupational Therapy Session Note  Patient Details  Name: Curtis Frazier MRN: [id number] Date of Birth: 06-02-1941  Today's Date: 07/13/2011 Time: 6295-2841 Time Calculation (min): 45 min  Precautions: Precautions Precautions: Back Precaution Booklet Issued: No Precaution Comments: Corsett can be removed for showering. Required Braces or Orthoses: Yes Spinal Brace: Lumbar corset Restrictions Weight Bearing Restrictions: No  Short Term Goals: OT Short Term Goal 1: Short term goals are equal to long term goals set at modified independent level secondary to short length of stay.  Skilled Therapeutic Interventions/Progress Updates:  Son and wife present during session. Therapist explained pt's progress with family and educated on energy conservation and precautions. Pt ambulated with RW to/from gift shop with no rest breaks. O2=96% on RA and HR=86 after walk. Focus on activity tolerance, functional ambulation, and family education.     Pain Pain Assessment Pain Assessment: None stated  Therapy/Group: Individual Therapy  Loleta Chance, OTAS 07/13/2011, 2:00 PM

## 2011-07-13 NOTE — Progress Notes (Signed)
Occupational Therapy Session Note  Patient Details  Name: Curtis Frazier MRN: [id number] Date of Birth: December 11, 1940  Today's Date: 07/13/2011 Time: 0900-1000 Time Calculation (min): 60 min  Precautions: Precautions Precautions: Back Precaution Booklet Issued: No Precaution Comments: Corsett can be removed for showering. Required Braces or Orthoses: Yes Spinal Brace: Lumbar corset (when OOB) Restrictions Weight Bearing Restrictions: No  Short Term Goals: OT Short Term Goal 1: Short term goals are equal to long term goals set at modified independent level secondary to short length of stay.  Skilled Therapeutic Interventions/Progress Updates:  ADL retraining in room. Upon entering room pt stated he took shower with nursing staff assistance. Pt completed UB/LB dressing and able to don/doff corsett with supervision. Pt ambulated using RW to ADL apartment and completed walk-in shower transfer with supervision. Simple meal prep/safety training in kitchen. Pt ambulated with RW back to room and completed toileting/toilet transfer also with supervision. Pt followed all back precautions and demonstrated correct use of AE.    Pain Pain Assessment Pain Assessment: 0-10 Pain Score:   2 Pain Type: Surgical pain Pain Location: Back  Therapy/Group: Individual Therapy  Loleta Chance, OTAS 07/13/2011, 11:44 AM

## 2011-07-13 NOTE — Progress Notes (Signed)
Inpatient Rehabilitation Center Individual Statement of Services  Patient Name:  Curtis Frazier  Date:  07/13/2011  Welcome to the Inpatient Rehabilitation Center.  Our goal is to provide you with an individualized program based on your diagnosis and situation, designed to meet your specific needs.  With this comprehensive rehabilitation program, you will be expected to participate in at least 3 hours of rehabilitation therapies Monday-Friday, with modified therapy programming on the weekends.  Your rehabilitation program will include the following services:  Physical Therapy (PT), Occupational Therapy (OT), 24 hour per day rehabilitation nursing, Therapeutic Recreaction (TR), Case Management (RN and Child psychotherapist), Rehabilitation Medicine, Nutrition Services and Pharmacy Services  Weekly team conferences will be held on  Tuesday  to discuss your progress.  Your RN Case Designer, television/film set will talk with you frequently to get your input and to update you on team discussions.  Team conferences with you and your family in attendance may also be held.  Estimated Length of Stay: 7 days                           Goals:  Modified Independent- Intermittent Assist  Depending on your progress and recovery, your program may change.  Your RN Case Estate agent will coordinate services and will keep you informed of any changes.  Your RN Sports coach and SW names and contact numbers are listed  below.  The following services may also be recommended but are not provided by the Inpatient Rehabilitation Center:   Driving Evaluations  Home Health Rehabiltiation Services  Outpatient Rehabilitatation [facility]  Vocational Rehabilitation   Arrangements will be made to provide these services after discharge if needed.  Arrangements include referral to agencies that provide these services.  Your insurance has been verified to be:  Medicare + BCBS Your primary doctor is:  Dr. Roxy Manns  Pertinent information will be shared with your doctor and your insurance company.  Case Manager: Melanee Spry, [facility] [phone]  Social Worker:  Amada Jupiter, [place] [phone]  Information discussed with pt & wife & copy given to patient by: Brock Ra, 07/13/2011, 10:56 AM

## 2011-07-13 NOTE — Progress Notes (Signed)
Physical Therapy Session Note  Patient Details  Name: Curtis Frazier MRN: [id number] Date of Birth: Oct 03, 1940  Today's Date: 07/13/2011 Time: 1033-1109 Time Calculation (min): 36 min  Second Session:  13:45-14:13 Time:  28 min   Precautions: Precautions Precautions: Back Precaution Booklet Issued: No Precaution Comments: Corsett can be removed for showering. Required Braces or Orthoses: Yes Spinal Brace: Lumbar corset Restrictions Weight Bearing Restrictions: No  S:  Pt verbalizing his desire to discharge tomorrow.  Wife present and agreed, asking questions about how to get equipment.  CM, Melanee Spry, notified. Pain Pain Assessment Pain Assessment: No/denies pain  Mobility Transfers Sit to Stand: 5: Supervision Stand to Sit: 5: Supervision Locomotion  Ambulation Ambulation/Gait Assistance: 5: Supervision Ambulation Distance (Feet): 300 Feet (175,120') Assistive device: Rolling walker Gait without RW x 80' with min@, pt reports that he does not feel comfortable yet without RW. Stairs / Management consultant Assistance: 5: Supervision Stair Management Technique: With walker Number of Stairs: 1  Height of Stairs: 6   Car transfer to car height with supervision using RW. Exercises Cardiovascular Exercises Kinetron: 50 cm2 x6 min (LE strengthening)   Second Session:  Biodex balance training for limits of stability and random control.  Gait training with RW 160' with supervision, without assistive device x 250' with close supervision.  Pt with improvement in step length and speed from the am. Therapy/Group: Individual Therapy  [name] 07/13/2011, 12:06 PM

## 2011-07-13 NOTE — Progress Notes (Signed)
Patient ID: Curtis Frazier, male   DOB: Nov 01, 1940, 71 y.o.   MRN: [id number] Patient ID: Curtis Frazier, male   DOB: Sep 25, 1940, 71 y.o.   MRN: [id number] Subjective/Complaints: Review of Systems  Musculoskeletal: Positive for back pain and joint pain.  Psychiatric/Behavioral: The patient has insomnia.   All other systems reviewed and are negative.  1/31 a little catch in right low back   Objective: Vital Signs: Blood pressure 133/89, pulse 78, temperature 99.3 F (37.4 C), temperature source Oral, resp. rate 18, weight 80.196 kg (176 lb 12.8 oz), SpO2 89.00%. No results found.  Basename 07/12/11 0501  WBC 9.9  HGB 10.2*  HCT 30.0*  PLT 406*    Basename 07/12/11 0501 07/11/11 0550  NA 135 135  K 3.7 3.9  CL 100 101  CO2 24 26  GLUCOSE 166* 163*  BUN 13 13  CREATININE 1.02 1.00  CALCIUM 8.8 8.9   CBG (last 3)   Basename 07/13/11 0725 07/12/11 2122 07/12/11 1636  GLUCAP 136* 163* 114*    Wt Readings from Last 3 Encounters:  07/12/11 80.196 kg (176 lb 12.8 oz)  07/09/11 90.1 kg (198 lb 10.2 oz)  07/09/11 90.1 kg (198 lb 10.2 oz)    Physical Exam:  General appearance: alert, cooperative and no distress Head: Normocephalic, without obvious abnormality, atraumatic Eyes: conjunctivae/corneas clear. PERRL, EOM's intact. Fundi benign. Ears: normal TM's and external ear canals both ears Nose: Nares normal. Septum midline. Mucosa normal. No drainage or sinus tenderness. Throat: lips, mucosa, and tongue normal; teeth and gums normal Neck: no adenopathy, no carotid bruit, no JVD, supple, symmetrical, trachea midline and thyroid not enlarged, symmetric, no tenderness/mass/nodules Back: symmetric, no curvature. ROM normal. No CVA tenderness. Resp: clear to auscultation bilaterally Cardio: regular rate and rhythm, S1, S2 normal, no murmur, click, rub or gallop GI: soft, non-tender; bowel sounds normal; no masses,  no organomegaly Extremities: extremities normal, atraumatic, no  cyanosis or edema Pulses: 2+ and symmetric Skin: Skin color, texture, turgor normal. No rashes or lesions Neurologic: strength functional. No sensory findings.  Incision/Wound: scant serosanginous drainage from back wound.  Well approximated. Scar tissue palpated along wound. 1/31 exam  Assessment/Plan: 1. 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 Physiatrist is providing close team supervision and 24 hour management of active medical problems listed below. Physiatrist and rehab team continue to assess barriers to discharge/monitor patient progress toward functional and medical goals. FIM: FIM - Bathing Bathing Steps Patient Completed: Chest;Right Arm;Left Arm;Abdomen;Front perineal area;Buttocks;Right upper leg;Left upper leg Bathing: 4: Min-Patient completes 8-9 27f 10 parts or 75+ percent  FIM - Upper Body Dressing/Undressing Upper body dressing/undressing steps patient completed: Put head through opening of pull over shirt/dress;Pull shirt over trunk;Thread/unthread right sleeve of pullover shirt/dresss;Thread/unthread left sleeve of pullover shirt/dress Upper body dressing/undressing: 5: Set-up assist to: Obtain clothing/put away FIM - Lower Body Dressing/Undressing Lower body dressing/undressing steps patient completed: Thread/unthread right underwear leg;Thread/unthread left underwear leg;Pull underwear up/down;Thread/unthread right pants leg;Thread/unthread left pants leg;Pull pants up/down Lower body dressing/undressing: 4: Min-Patient completed 75 plus % of tasks  FIM - Toileting Toileting steps completed by patient: Adjust clothing prior to toileting;Performs perineal hygiene;Adjust clothing after toileting Toileting Assistive Devices: Grab bar or rail for support Toileting: 4: Steadying assist  FIM - Transport planner Transfers: 4-To toilet/BSC: Min A (steadying Pt. > 75%)  FIM - Bed/Chair  Transport planner Devices: HOB elevated (Elevated approximately 20 degrees.) Bed/Chair Transfer: 5: Supine > Sit: Supervision (verbal cues/safety issues);5: Bed > Chair or W/C: Supervision (verbal cues/safety issues)  FIM - Locomotion: Wheelchair Distance: 150 Locomotion: Wheelchair: 5: Travels 150 ft or more: maneuvers on rugs and over door sills with supervision, cueing or coaxing FIM - Locomotion: Ambulation Ambulation/Gait Assistance: 4: Min assist Locomotion: Ambulation: 1: Travels less than 50 ft with minimal assistance (Pt.>75%)  Comprehension Comprehension Mode: Auditory Comprehension: 7-Follows complex conversation/direction: With no assist  Expression Expression Mode: Verbal Expression: 7-Expresses complex ideas: With no assist  Social Interaction Social Interaction: 7-Interacts appropriately with others - No medications needed.  Problem Solving Problem Solving: 5-Solves basic 90% of the time/requires cueing < 10% of the time  Memory Memory: 7-Complete Independence: No helper  1. DVT Prophylaxis/Anticoagulation: Mechanical: Sequential compression devices, below knee Bilateral lower extremities  2. Pain Management: prn oxycodone, tylenol, robaxin. May utilize heat for muscle spasms as long as not on wound. Consider pretreating with pain meds for better activity tolerance. Good posture maintnenance also 3. Mood: provide ego support as needed. Has been fairly upbeat.  4.DM type 2: Diet controlled. Currently under reasonable control.  No change to plan 5. HTN: monitor with bid checks. Pain control important for bp control also.  6. PAF: Post op arrythmia resolved. Continue toprol 25 mg daily as at home and follow closely as we push him physically with therapies. In NSR today in 70's 7. RA: On methotrexate weekly with folic acid for supplement.  8. Dyslipidemia:  Continue daily zocor.   LOS (Days) 2 A FACE TO FACE EVALUATION WAS PERFORMED  Octivia Canion T 07/13/2011, 9:02 AM

## 2011-07-14 DIAGNOSIS — Z5189 Encounter for other specified aftercare: Secondary | ICD-10-CM

## 2011-07-14 DIAGNOSIS — M47817 Spondylosis without myelopathy or radiculopathy, lumbosacral region: Secondary | ICD-10-CM

## 2011-07-14 DIAGNOSIS — I4891 Unspecified atrial fibrillation: Secondary | ICD-10-CM

## 2011-07-14 DIAGNOSIS — D62 Acute posthemorrhagic anemia: Secondary | ICD-10-CM

## 2011-07-14 DIAGNOSIS — IMO0002 Reserved for concepts with insufficient information to code with codable children: Secondary | ICD-10-CM

## 2011-07-14 NOTE — Progress Notes (Signed)
Physical Therapy Discharge Summary  Patient Details  Name: Curtis Frazier MRN: [id number] Date of Birth: 1940-09-20 Today's Date: 07/14/2011  #1 8:30- 9:30 individual therapy 2/10 pain in back with medications given prior to session. Gait x 150' x2 with RW mod I and home environment mod I. Gait without RW including sidesteps, backing, SLS. Stair training x 12 with 2 rails for strengthening and curb with RW mod I. Pt given HEP for [place] exercises and reviewed x 20 each with handout. Pt made Mod I in room. [name], PTA  Patient has met 7 of 7 long term goals due to decreased pain.  Patient to discharge at an ambulatory level Modified Independent with RW.   Patient does not require physical or cognitive assistance at home, and was given HEP for LE strengthening and balance  Recommendation:  Patient will benefit from ongoing skilled PT services in outpatient setting to continue to advance safe functional mobility, address ongoing impairments in core strength and mobility, gait, balance, and minimize fall risk.  Equipment: Equipment provided: RW  Patient/family agrees with progress made and goals achieved: Yes  [name] 07/14/2011, 9:16 AM

## 2011-07-14 NOTE — Patient Care Conference (Signed)
Inpatient RehabilitationTeam Conference Note Date: 07/14/2011   Time: 4:37 PM    Patient Name: Curtis Frazier      Medical Record Number: [id number]  Date of Birth: [birth year] Sex: Male         Room/Bed: 4036/4036-01 Payor Info: Payor: MEDICARE  Plan: MEDICARE PART A AND B  Product Type: *No Product type*     Admitting Diagnosis: THORACOLUMBAR FUSION  Admit Date/Time:  07/11/2011  3:56 PM Admission Comments: No comment available   Primary Diagnosis:  <principal problem not specified> Principal Problem: <principal problem not specified>  Patient Active Problem List  Diagnoses Date Noted  . Aortic stenosis 07/08/2011  . New onset a-fib 07/05/2011    Class: Acute  . HYPERKALEMIA 02/18/2010  . ESSENTIAL HYPERTENSION, BENIGN 09/13/2009  . ANEMIA-UNSPECIFIED 06/10/2009  . ANGIODYSPLASIA-INTESTINE 06/10/2009  . MURMUR 05/21/2009  . CAROTID BRUIT 05/21/2009  . C A D 03/09/2009  . GERD 03/09/2009  . ARTHRITIS, RHEUMATOID 04/29/2008  . HX, PERSONAL, COLONIC POLYPS 01/17/2007  . DIABETES MELLITUS, TYPE II 09/18/2006  . HYPERLIPIDEMIA 09/18/2006  . PSORIATIC ARTHRITIS 09/18/2006  . PSORIASIS 09/18/2006  . 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 DEGENERATIVE DISC DISEASE 09/18/2006  . SCOLIOSIS 09/18/2006  . TOBACCO ABUSE, HX OF 09/18/2006    Expected Discharge Date: Expected Discharge Date: 07/14/11  Team Members Present: Physician: Dr. Faith Rogue Case Manager Present: Lutricia Horsfall, RN Social Worker Present: Amada Jupiter, LCSW PT Present: [name], PTA OT Present: Ardis Rowan, COTA RN: Lutricia Horsfall, RN     Current Status/Progress Goal Weekly Team Focus  Medical   pain controlled.  wound will need observation as an outpatient given his use of mtx  pain control. safety and adherence to brace wear  see above   Bowel/Bladder     Continent B&B   Maintain continence     Swallow/Nutrition/ Hydration             ADL's   mod I overall  mod I overall      Mobility   mod I  mod I  family ed   Communication             Safety/Cognition/ Behavioral Observations            Pain     Back pain 2-3   Maintain >3     Skin     Incision clean, dry, steri-strips intact.   Maintain free of infection        *See Interdisciplinary Assessment and Plan and progress notes for long and short-term goals  Barriers to Discharge: none    Possible Resolutions to Barriers:  none    Discharge Planning/Teaching Needs:  home with wife who can provide any assistance needed      Team Discussion:   Mini- conference: Team in agreement pt ready for d/c to home today.  Revisions to Treatment Plan:     Continued Need for Acute Rehabilitation Level of Care: The patient requires daily medical management by a physician with specialized training in physical medicine and rehabilitation for the following conditions: Daily direction of a multidisciplinary physical rehabilitation program to ensure safe treatment while eliciting the highest outcome that is of practical value to the patient.: Yes Daily medical management of patient stability for increased activity during participation in an intensive rehabilitation regime.: Yes Daily analysis of laboratory values and/or radiology reports with any subsequent need for medication adjustment of medical intervention for : Post surgical problems;Other  Meryl Dare 07/14/2011, 4:37 PM

## 2011-07-14 NOTE — Progress Notes (Signed)
Occupational Therapy Session Note and Discharge Summary  Patient Details  Name: DAELAN GATT MRN: [id number] Date of Birth: 05-04-1941 Today's Date: 07/14/2011   Session Note Time: 1300-1340 Individual Therapy Pt denies pain Pt engaged in dynamic standing activities to increase activity tolerance.  Pt utilized reacher to gather items from floor and safely carried items using walker bag to place in container.  No unsafe behaviors noted.  Discharge Summary Patient has met 10 of 10 long term goals due to improved activity tolerance and improved balance.  Pt progressed well this admission.  Pt required min A for BADLs on admission and will discharge home at mod I level for bathing, dressing, toilet transfers, toileting, shower transfer, and simple meal prep.Pt is able to doff and donn back corset without assistance and has verbalized understanding that this brace must be worn when he is ambulating.  Pt can remove brace when in shower.   Patient's care partner is independent to provide the necessary physical assistance at discharge.    Recommendation:  Pt is currently modified independent with all selfcare and basic ADLs.  Feel he does not warrant any further OT needs at this time.  Equipment: Equipment provided: [facility]  Patient/family agrees with progress made and goals achieved: Yes  [name] 07/14/2011, 1:42 PM

## 2011-07-14 NOTE — Progress Notes (Signed)
Subjective/Complaints: Review of Systems  Musculoskeletal: Positive for back pain and joint pain.  Psychiatric/Behavioral: The patient has insomnia.   All other systems reviewed and are negative.  2/1--no complaints. Excited to go home   Objective: Vital Signs: Blood pressure 114/59, pulse 63, temperature 98.8 F (37.1 C), temperature source Oral, resp. rate 20, weight 80.196 kg (176 lb 12.8 oz), SpO2 93.00%. No results found.  Basename 07/12/11 0501  WBC 9.9  HGB 10.2*  HCT 30.0*  PLT 406*    Basename 07/12/11 0501  NA 135  K 3.7  CL 100  CO2 24  GLUCOSE 166*  BUN 13  CREATININE 1.02  CALCIUM 8.8   CBG (last 3)   Basename 07/13/11 2143 07/13/11 1627 07/13/11 1149  GLUCAP 201* 168* 180*    Wt Readings from Last 3 Encounters:  07/12/11 80.196 kg (176 lb 12.8 oz)  07/09/11 90.1 kg (198 lb 10.2 oz)  07/09/11 90.1 kg (198 lb 10.2 oz)    Physical Exam:  General appearance: alert, cooperative and no distress Head: Normocephalic, without obvious abnormality, atraumatic Eyes: conjunctivae/corneas clear. PERRL, EOM's intact. Fundi benign. Ears: normal TM's and external ear canals both ears Nose: Nares normal. Septum midline. Mucosa normal. No drainage or sinus tenderness. Throat: lips, mucosa, and tongue normal; teeth and gums normal Neck: no adenopathy, no carotid bruit, no JVD, supple, symmetrical, trachea midline and thyroid not enlarged, symmetric, no tenderness/mass/nodules Back: symmetric, no curvature. ROM normal. No CVA tenderness. Resp: clear to auscultation bilaterally Cardio: regular rate and rhythm, S1, S2 normal, no murmur, click, rub or gallop GI: soft, non-tender; bowel sounds normal; no masses,  no organomegaly Extremities: extremities normal, atraumatic, no cyanosis or edema Pulses: 2+ and symmetric Skin: Skin color, texture, turgor normal. No rashes or lesions Neurologic: strength functional. No sensory findings.  Incision/Wound: scant  serosanginous drainage from back wound.  Well approximated. Scar tissue and some swelling palpated along wound's superior aspect.  2/1 exam  Assessment/Plan: 1. 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 Physiatrist is providing close team supervision and 24 hour management of active medical problems listed below. Physiatrist and rehab team continue to assess barriers to discharge/monitor patient progress toward functional and medical goals.  Home today. F/u with NS in the next week or so.  FIM: FIM - Bathing Bathing Steps Patient Completed: Chest;Right Arm;Left Arm;Abdomen;Front perineal area;Buttocks;Right upper leg;Left upper leg Bathing: 4: Min-Patient completes 8-9 47f 10 parts or 75+ percent  FIM - Upper Body Dressing/Undressing Upper body dressing/undressing steps patient completed: Thread/unthread right sleeve of pullover shirt/dresss;Thread/unthread left sleeve of pullover shirt/dress;Put head through opening of pull over shirt/dress;Pull shirt over trunk Upper body dressing/undressing: 5: Set-up assist to: Obtain clothing/put away FIM - Lower Body Dressing/Undressing Lower body dressing/undressing steps patient completed: Pull underwear up/down;Thread/unthread right pants leg;Thread/unthread left pants leg;Pull pants up/down Lower body dressing/undressing: 5: Set-up assist to: Obtain clothing  FIM - Toileting Toileting steps completed by patient: Adjust clothing prior to toileting;Performs perineal hygiene;Adjust clothing after toileting Toileting Assistive Devices: Grab bar or rail for support Toileting: 5: Supervision: Safety issues/verbal cues  FIM - Diplomatic Services operational officer Devices: Art gallery manager Transfers: 5-To toilet/BSC: Supervision (verbal cues/safety issues)  FIM - Physiological scientist Devices: HOB elevated (Elevated approximately 20 degrees.) Bed/Chair Transfer: 6: Assistive device: no helper  FIM - Locomotion: Wheelchair Distance: 150 Locomotion: Wheelchair: 5:  Travels 150 ft or more: maneuvers on rugs and over door sills with supervision, cueing or coaxing FIM - Locomotion: Ambulation Locomotion: Ambulation Assistive Devices: Walker - Rolling Ambulation/Gait Assistance: 5: Supervision Locomotion: Ambulation: 5: Travels 150 ft or more with supervision/safety issues  Comprehension Comprehension Mode: Auditory Comprehension: 7-Follows complex conversation/direction: With no assist  Expression Expression Mode: Verbal Expression: 7-Expresses complex ideas: With no assist  Social Interaction Social Interaction: 7-Interacts appropriately with others - No medications needed.  Problem Solving Problem Solving: 7-Solves complex problems: Recognizes & self-corrects  Memory Memory: 7-Complete Independence: No helper  1. DVT Prophylaxis/Anticoagulation: Mechanical: Sequential compression devices, below knee Bilateral lower extremities  2. Pain Management: prn oxycodone, tylenol, robaxin.  3. Mood: provide ego support as needed. Has been fairly upbeat.  4.DM type 2: Diet controlled. Currently under reasonable control.   5. HTN: monitor with bid checks. Pain control important for bp control also.  6. PAF: Post op arrythmia resolved. Continue toprol 25 mg daily as at home and follow closely as we push him physically with therapies. In NSR today in 70's 7. RA: On methotrexate weekly with folic acid for supplement.  8. Dyslipidemia: Continue daily zocor.  9. Wound- needs to be watched at discharge.  No drainage at present. Healing will be delayed by mtx.  -f/u with surgery next week.  LOS (Days) 3 A FACE TO FACE EVALUATION WAS PERFORMED  Voris Tigert T 07/14/2011, 7:56 AM

## 2011-07-14 NOTE — Progress Notes (Signed)
Occupational Therapy Session Note  Patient Details  Name: Curtis Frazier MRN: [id number] Date of Birth: April 19, 1941  Today's Date: 07/14/2011 Time: 1100-1145 Time Calculation (min): 45 min  Precautions: Precautions Precautions: Back Precaution Booklet Issued: No Precaution Comments: Corsett can be removed for showering. Required Braces or Orthoses: Yes Spinal Brace: Applied in sitting position;Lumbar corset Restrictions Weight Bearing Restrictions: No  Short Term Goals: OT Short Term Goal 1: Short term goals are equal to long term goals set at modified independent level secondary to short length of stay.  Skilled Therapeutic Interventions/Progress Updates:    ADL retraining including bathing with sit to stand in walk-in shower in room and dressing from EOB.  Pt amb with RW to BR for shower and back to bed for dressing.  Pt completed grooming activities standing at sink.  Pt used sock aide to don socks.  Discussed with patient need to wear back corset when walking but he could remove it to shower.  Pt verbalized understanding.  Focus on safety awareness.  Pt is mod I in room with RW.  General General Amount of Missed OT Time (min): 15 Minutes (pt stated he neede to rest)   Pain Pain Assessment Pain Assessment: 0-10 Pain Score:   1 Pain Type: Acute pain Pain Location: Back Pain Orientation: Lower;Right     Therapy/Group: Individual Therapy  [name] 07/14/2011, 1:26 PM

## 2011-07-14 NOTE — Discharge Planning (Signed)
Social Work  Discharge Note  The overall goal for the admission was met for:   Discharge location: Yes - home with wife  Length of Stay: Yes - 3 days  Discharge activity level: Yes  Home/community participation: Yes  Services provided included: MD, RD, PT, OT, RN, CM, Pharmacy and SW  Financial Services: Medicare  Follow-up services arranged: Outpatient: PT via [facility], DME: rolling walker and 3n1 commode via [facility] and Patient/Family has no preference for HH/DME agencies  Comments (or additional information):  Patient/Family verbalized understanding of follow-up arrangements: Yes  Individual responsible for coordination of the follow-up plan: patient  Confirmed correct DME delivered: Akela Pocius 07/14/2011    Nuriya Stuck

## 2011-07-15 NOTE — Discharge Summary (Signed)
NAMELAMARK, SCHUE NO.:  [id number]  MEDICAL RECORD NO.:  [id number]  LOCATION:  4036                         FACILITY:  [facility]  PHYSICIAN:  Ranelle Oyster, M.D.DATE OF BIRTH:  1941-02-21  DATE OF ADMISSION:  07/11/2011 DATE OF DISCHARGE:  07/14/2011                              DISCHARGE SUMMARY   DISCHARGE DIAGNOSES: 1. Severe lumbar stenosis with radiculopathy. 2. Rheumatoid arthritis. 3. Hypertension. 4. Paroxysmal atrial fibrillation. 5. Diabetes mellitus type 2, diet controlled.  HISTORY OF PRESENT ILLNESS:  [name] is a 71 year old right- handed male with history of rheumatoid arthritis with chronic back pain, radiating to bilateral lower extremities.  Recent lumbar MRI and myelogram showed significant thoracolumbar scoliosis with degenerative changes.  The patient underwent decompressive thoracolumbar surgery with arthrodesis by Dr. Lovell Sheehan on July 03, 2011.  Was fitted with LSO. Postop was noted to have anemia with hemoglobin at 7.5 and was transfused with hemoglobin improving to 8.9.  He was noted to have new- onset atrial fibrillation, and [facility] was consulted for input.  Echocardiogram done showed EF of 65%.  No wall abnormality. Venous Dopplers done were negative for DVT.  Initially, he was placed on amiodarone, which was discontinued as the patient converted to normal sinus rhythm and is currently staying in sinus rhythm.  The patient remains on his Toprol 25 mg a day.  Cardiac enzymes have been negative. Currently, the patient with some shortness of breath requiring O2 per nasal cannula.  He has also had issues with urinary retention requiring in and out caths.  He continues to be deconditioned with poor safety reported with mobility.  The patient was evaluated by Rehab, and we felt that he would benefit from a CIR program.  PAST MEDICAL HISTORY:  Positive for: 1. Coronary atherosclerosis. 2. History of colon  polyps. 3. Actinic keratosis. 4. Psoriatic arthropathy. 5. Scoliosis. 6. GI bleed. 7. Gastritis. 8. Hyperlipidemia. 9. Hypertension. 10.Pneumonia. 11.Occasional headaches. 12.RA. 13.Chronic back pain. 14.GERD. 15.Hemorrhoids. 16.History of renal calculi. 17.DM type 2, diet controlled.  PAST SURGICAL HISTORY:  Left rotator cuff repair, vasectomy, PTCA stent, cataract surgery, lithotripsy.  ALLERGIES: 1. PENICILLIN. 2. TETRACYCLINE.  FAMILY HISTORY:  Positive for coronary artery disease, diabetes in his mother.  Brother with pancreatic cancer, prostate cancer, and diabetes. Sister with coronary artery disease, hypertension, and renal failure.  SOCIAL HISTORY:  The patient is married, was independent prior to admission.  He quit smoking years ago.  Does not use any tobacco.  Does not use any alcohol or illicit drugs.  Home is 1 level with 1 step at entry.  FUNCTIONAL HISTORY:  The patient was independent with basic ADLs and mobility prior to admission.  He was working full time as a Naval architect.  FUNCTIONAL STATUS:  The patient is requiring min to mod assist for bed mobility.  Mod assist for stand pivot transfers.  Min assist for ambulating 50 feet with a rolling walker with shuffling gait.  Noted to require frequent rest breaks.  The patient is min assist for grooming. Min assist for toileting.  PHYSICAL EXAMINATION:  VITAL SIGNS:  Blood pressure 136/71, pulse 71, temperature 98.7, respirations 19.  GENERAL:  The patient is a well-nourished, well-developed male, alert and oriented x3.  No acute distress. HEENT:  Oral mucosa is pink and moist.  Dentition fair.  Hearing intact. NECK:  Supple without JVD or lymphadenopathy. HEART:  Regular rate and rhythm without murmurs, gallops, or rubs. LUNGS:  Clear to auscultation bilaterally, except for some mild rales at bases. ABDOMEN:  Soft, nontender.  Positive bowel sounds. SKIN:  Wound is clean, dry, intact, well  approximated with minimal drainage.  Some scar tissue noted around the wound. NEUROLOGIC:  The patient with cranial nerve intact, cognition appropriate.  Motor strength is 4 to 4+/5 in upper extremity.  Lower extremity is 3+/5 proximally, 4/5 distally.  No sensory deficits.  DTRs 1+.  HOSPITAL COURSE:  [name] was admitted to rehab on July 11, 2011 for inpatient therapies to consist of PT, OT at least 3 hours 5 days a week.  Past admission, physiatrist, rehab, RN, and therapy team have worked together to provide customized collaborative interdisciplinary care.  Rehab RN has worked with the patient on bowel and bladder program as well as wound care monitoring and with med administration.  The patient's blood pressures were monitored on a b.i.d. basis during this stay. These were noted to be well controlled ranging from low 100s-130s systolic 50s-80s diastolic.  Heart rate has been stable in 60s-80s range.  The patient was started on bowel program for constipation and currently constipation has resolved.  Pain control has been reasonable with p.r.n. use of oxycodone.  The patient's back incision is noted to be healing well without any signs or symptoms of infection.  He is noted to have some prominence at the proximal aspect of the incision; however, no drainage and no erythema noted.   At the time of admission, the patient was noted to require min assist for all mobility.  Physical Therapy has worked with the patient on balance and mobility.  By the time of discharge, the patient is modified independent for transfers, modified independent for ambulating 150 feet with a rolling walker.  He is able to navigate 12 stairs with 2 rails at modified independent level.  He is given a home exercise program for lower extremity strengthening and balance.  OT has worked with the patient on self-care Tasks and currently, the patient is modified independent for all self-care and basic ADLs.   He does not require further followup OT.  Outpatient physical therapy has been set up to continue past discharge.  On July 14, 2011, the patient is discharged to home.  DISCHARGE MEDICATIONS: 1. Oxycodone 5 mg 1-2 p.o. q.4 h. p.r.n. moderate-to-severe pain, #45     Rx. 2. Coated aspirin 81 mg a day. 3. Vitamin D 1000 mg per day. 4. Clobetasol cream for skin rash b.i.d. p.r.n. 5. Plavix 75 mg p.o. per day. 6. Fish oil tablets 1 p.o. per day. 7. Folic acid 1 mg per day. 8. Toprol-XL 25 mg a day. 9. Multivitamin 1 per day. 10.Omeprazole 40 mg a day. 11.Pravastatin 10 mg a day. 12.Refresh eye drops 1 gtt OU p.r.n. 13.Nitroglycerin p.r.n. chest pain.  DIET:  Carb modified diet.  ACTIVITY:  At intermittent supervision.  No strenuous activity.  No lifting anything over 5 pounds.  No driving until cleared by surgeon.  Wound care keep the area clean and dry.  SPECIAL INSTRUCTIONS:  Outpatient physical therapy at [facility] to begin on July 18, 2011 at 10:30.  Maintain back precautions.  Wear LSO when  out of bed.  FOLLOWUP:  The patient to follow up with Dr. Tressie Stalker for postop check in 2 weeks; follow up with Dr. Roxy Manns for post hospital visit in 2 weeks;  follow up with Dr. Faith Rogue as needed.     Delle Reining, P.A.   ______________________________ Ranelle Oyster, M.D.    PL/MEDQ  D:  07/14/2011  T:  07/15/2011  Job:  [id number]  cc:   Cristi Loron, M.D. Marne A. Milinda Antis, MD

## 2011-07-17 ENCOUNTER — Ambulatory Visit: Payer: Medicare Other | Admitting: Physical Therapy

## 2011-07-18 ENCOUNTER — Ambulatory Visit
Payer: Medicare Other | Attending: Physical Medicine & Rehabilitation | Admitting: Rehabilitative and Restorative Service Providers"

## 2011-07-18 DIAGNOSIS — R269 Unspecified abnormalities of gait and mobility: Secondary | ICD-10-CM | POA: Insufficient documentation

## 2011-07-18 DIAGNOSIS — IMO0001 Reserved for inherently not codable concepts without codable children: Secondary | ICD-10-CM | POA: Insufficient documentation

## 2011-07-19 ENCOUNTER — Encounter: Payer: Self-pay | Admitting: Family Medicine

## 2011-07-19 ENCOUNTER — Ambulatory Visit (INDEPENDENT_AMBULATORY_CARE_PROVIDER_SITE_OTHER): Payer: Medicare Other | Admitting: Family Medicine

## 2011-07-19 VITALS — BP 116/68 | HR 68 | Temp 98.1°F | Ht 68.0 in | Wt 170.8 lb

## 2011-07-19 DIAGNOSIS — R0789 Other chest pain: Secondary | ICD-10-CM | POA: Insufficient documentation

## 2011-07-19 DIAGNOSIS — M549 Dorsalgia, unspecified: Secondary | ICD-10-CM

## 2011-07-19 DIAGNOSIS — R071 Chest pain on breathing: Secondary | ICD-10-CM

## 2011-07-19 NOTE — Progress Notes (Signed)
Subjective:    Patient ID: Curtis Frazier, male    DOB: [birth year], 71 y.o.   MRN: [id number]  HPI Just had back pain -- was in surgery for 9 hours  Did a lumbar fusion  Had afib ? (abnormal rhythm) as well as low oxygen -- new for him  No heart failure  Has not seen his heart doctor yet -- sees Dr Eden Emms 2/21 and Dr Lovell Sheehan 2/26  Is in a back brace --it was jammed forcibly underneath him and he thinks it may have hurt his back   Overall some soreness is and some is improved  2 pain pills per day  PT - did at hosp-- and went yesterday outpt  Is doing very well with that   Came home on 2/1 and noticed soreness and swelling both sides of chest neer axillary area  ? What it came from , ? Trauma  Also has a knot in L breast -- or rib, hard to tell Hands and arms feel ok  Is using a walker   Patient Active Problem List  Diagnoses  . DIABETES MELLITUS, TYPE II  . HYPERLIPIDEMIA  . HYPERKALEMIA  . ANEMIA-UNSPECIFIED  . ESSENTIAL HYPERTENSION, BENIGN  . C A D  . GERD  . ANGIODYSPLASIA-INTESTINE  . PSORIATIC ARTHRITIS  . PSORIASIS  . ACTINIC KERATOSIS  . ARTHRITIS, RHEUMATOID  . DEGENERATIVE DISC DISEASE  . SCOLIOSIS  . MURMUR  . CAROTID BRUIT  . HX, PERSONAL, COLONIC POLYPS  . TOBACCO ABUSE, HX OF  . New onset a-fib  . Aortic stenosis  . Chest wall pain  . Back pain   Past Medical History  Diagnosis Date  . Other and unspecified hyperlipidemia   . Coronary atherosclerosis of unspecified type of vessel, native or graft   . Personal history of colonic polyps   . Actinic keratosis   . Psoriatic arthropathy   . Other psoriasis   . Scoliosis (and kyphoscoliosis), idiopathic   . GI bleed 12/10    cecam AVM  . Gastritis 12/10  . Hyperlipidemia     takes Pravastatin daily  . Hypertension     takes Metoprolol daily  . Myocardial infarction 2010  . Shortness of breath     with exertion  . Pneumonia     hx of-2008  . Headache     occasionally  . Joint pain   . Joint  swelling   . Rheumatoid arthritis     takes Methotrexate 7pills weekly  . Degeneration of intervertebral disc, site unspecified   . Chronic back pain     scoliosis/stenosis/radiculopathy,degenerative disc disease  . Psoriasis   . Bruises easily   . Esophageal reflux     takes Omeprazole daily  . History of colonic polyps   . Hemorrhoids   . Kidney stone     hx of  . Blood transfusion 2010  . Type II or unspecified type diabetes mellitus without mention of complication, not stated as uncontrolled     type II;controlled by diet and exercise   Past Surgical History  Procedure Date  . Rotator cuff repair 2000    left   . Colonoscopy   . Vasectomy 1979  . Ptca stent 02/2009    has one stent  . Carotid doppler 10/12    0-39% R and 60-79% left   . Cataract surgery 2012  . Cardiac catheterization 2010  . Lithotripsy 2009   History  Substance Use Topics  . Smoking status: Former Games developer  .  Smokeless tobacco: Not on file   Comment: quit 2010  . Alcohol Use: No     occasional   Family History  Problem Relation Age of Onset  . Coronary artery disease Mother   . Diabetes Mother   . Heart disease Mother     CAD  . Coronary artery disease Sister   . Hypertension Sister   . Kidney failure Sister   . Heart disease Sister     CAD  . Pancreatic cancer Brother   . Diabetes Brother   . Prostate cancer Brother   . Cancer Brother     pancreatic CA  . Diabetes Brother   . Anesthesia problems Neg Hx   . Hypotension Neg Hx   . Malignant hyperthermia Neg Hx   . Pseudochol deficiency Neg Hx    Allergies  Allergen Reactions  . Penicillins Swelling and Rash  . Tetracycline Swelling and Rash   Current Outpatient Prescriptions on File Prior to Visit  Medication Sig Dispense Refill  . aspirin EC 81 MG tablet Take 81 mg by mouth daily.      . Carboxymethylcellulose Sodium (REFRESH OP) Place 2-3 drops into both eyes daily as needed. For dry eyes.       . Cholecalciferol 1000 UNITS  capsule Take 1,000 Units by mouth daily.        Marland Kitchen CLOBETASOL PROPIONATE E 0.05 % emollient cream Apply 1 application topically 2 (two) times daily as needed. For skin rash.      . clopidogrel (PLAVIX) 75 MG tablet Take 1 tablet (75 mg total) by mouth daily.  30 tablet  6  . fish oil-omega-3 fatty acids 1000 MG capsule Take 1 g by mouth daily.       Marland Kitchen FOLIC ACID PO Take 1 tablet by mouth daily.       . metoprolol succinate (TOPROL-XL) 25 MG 24 hr tablet Take 1 tablet (25 mg total) by mouth daily.  90 tablet  3  . multivitamin (THERAGRAN) per tablet Take 1 tablet by mouth daily.        Marland Kitchen omeprazole (PRILOSEC) 40 MG capsule Take 40 mg by mouth daily.        Marland Kitchen oxyCODONE (OXY IR/ROXICODONE) 5 MG immediate release tablet Take 1-2 tablets (5-10 mg total) by mouth every 4 (four) hours as needed.  45 tablet  0  . pravastatin (PRAVACHOL) 10 MG tablet Take 1 tablet (10 mg total) by mouth daily.  30 tablet  11  . diazepam (VALIUM) 5 MG tablet Take 5 mg by mouth every 6 (six) hours as needed. For anxiety      . nitroGLYCERIN (NITROSTAT) 0.4 MG SL tablet Place 0.4 mg under the tongue every 5 (five) minutes as needed. For chest pain.           Review of Systems Review of Systems  Constitutional: Negative for fever, appetite change, fatigue and unexpected weight change.  Eyes: Negative for pain and visual disturbance.  Respiratory: Negative for cough and shortness of breath.   Cardiovascular: Negative for cp or palpitations    Gastrointestinal: Negative for nausea, diarrhea and constipation.  Genitourinary: Negative for urgency and frequency.   No nipple changes or discharge Skin: Negative for pallor or rash   MSK pos for back pain  Neurological: Negative for weakness, light-headedness, numbness and headaches.  Hematological: Negative for adenopathy. Does not bruise/bleed easily.  Psychiatric/Behavioral: Negative for dysphoric mood. The patient is not nervous/anxious.  Objective:   Physical  Exam  Constitutional: He appears well-developed and well-nourished. No distress.  HENT:  Head: Normocephalic and atraumatic.  Eyes: Conjunctivae and EOM are normal. Pupils are equal, round, and reactive to light. No scleral icterus.  Neck: Normal range of motion. Neck supple. No JVD present. Carotid bruit is not present. No tracheal deviation present. No thyromegaly present.  Cardiovascular: Normal rate, regular rhythm and intact distal pulses.  Exam reveals no gallop.   Murmur heard. Pulmonary/Chest: Effort normal and breath sounds normal. No respiratory distress. He has no wheezes. He has no rales. He exhibits no tenderness.       Chest wall / pectoral area and axillae do not appear swollen to me , and are nontender Few rubbery lesions resembling lipomas under breast areas  No redness/ palp cords or tenderness on arms   Abdominal: Soft. Bowel sounds are normal. He exhibits no distension, no abdominal bruit and no mass. There is no tenderness.  Musculoskeletal: He exhibits no edema and no tenderness.       3-4 cm lump noted (firm) towards top of his back incision  Lymphadenopathy:    He has no cervical adenopathy.  Neurological: He is alert. He has normal reflexes.  Skin: Skin is warm and dry. No erythema. No pallor.       Scar on back appears to be healing well without signs of infection   Psychiatric: He has a normal mood and affect.       In good spirits           Assessment & Plan:

## 2011-07-19 NOTE — Patient Instructions (Signed)
We will do ref to neurosurgeon at check out  You have a sore in the left nostril that caused nosebleed Use a bit of vaseline in nostril twice daily  For bleeding pinch firmly 10 minutes - then if not stopping - call or seek care

## 2011-07-19 NOTE — Assessment & Plan Note (Signed)
After lumbar fusion surgery (doing well with PT) Pt has worries / questions re: his brace and injury putting it on  Also has large lump under scar and wonders if this is normal  Will try to get him earlier appt with surgeon to discuss these issues

## 2011-07-19 NOTE — Assessment & Plan Note (Signed)
After surgery  Re assuring exam and I did not appreciate any swelling  Some lipomas on chest - baseline  No pleuritic pain  Suspect pain is due to PT/ using a walker and strain of pectoral muscles Will continue to monitor  Pt will see his surgeon with other questions

## 2011-07-25 ENCOUNTER — Ambulatory Visit: Payer: Medicare Other | Admitting: Rehabilitative and Restorative Service Providers"

## 2011-07-25 ENCOUNTER — Other Ambulatory Visit: Payer: Self-pay | Admitting: *Deleted

## 2011-07-25 MED ORDER — GLUCOSE BLOOD VI STRP: ORAL_STRIP | Status: AC

## 2011-07-28 ENCOUNTER — Ambulatory Visit: Payer: Medicare Other | Admitting: Rehabilitative and Restorative Service Providers"

## 2011-08-01 ENCOUNTER — Ambulatory Visit: Payer: Medicare Other | Admitting: Rehabilitative and Restorative Service Providers"

## 2011-08-01 NOTE — Plan of Care (Signed)
Problem: SCI BOWEL ELIMINATION Goal: RH STG MANAGE BOWEL WITH ASSISTANCE STG Manage Bowel with Assistance.  Outcome: Completed/Met Date Met:  07/14/11 supervision  Problem: RH SKIN INTEGRITY Goal: RH STG ABLE TO PERFORM INCISION/WOUND CARE W/ASSISTANCE STG Able To Perform Incision/Wound Care With Assistance.  Outcome: Completed/Met Date Met:  07/14/11 Max assist  Problem: RH SAFETY Goal: RH STG ADHERE TO SAFETY PRECAUTIONS W/ASSISTANCE/DEVICE STG Adhere to Safety Precautions With Assistance/Device.  Outcome: Completed/Met Date Met:  07/14/11 supervision Goal: RH STG DECREASED RISK OF FALL WITH ASSISTANCE STG Decreased Risk of Fall With Assistance.  Outcome: Completed/Met Date Met:  07/14/11 min  Problem: RH PAIN MANAGEMENT Goal: RH STG PAIN MANAGED AT OR BELOW PT'S PAIN GOAL Outcome: Completed/Met Date Met:  07/14/11 2/10 managed

## 2011-08-03 ENCOUNTER — Encounter: Payer: Self-pay | Admitting: Cardiovascular Disease

## 2011-08-03 ENCOUNTER — Ambulatory Visit (INDEPENDENT_AMBULATORY_CARE_PROVIDER_SITE_OTHER): Payer: Medicare Other | Admitting: Cardiovascular Disease

## 2011-08-03 DIAGNOSIS — E119 Type 2 diabetes mellitus without complications: Secondary | ICD-10-CM

## 2011-08-03 DIAGNOSIS — I4891 Unspecified atrial fibrillation: Secondary | ICD-10-CM

## 2011-08-03 DIAGNOSIS — I10 Essential (primary) hypertension: Secondary | ICD-10-CM

## 2011-08-03 DIAGNOSIS — R0989 Other specified symptoms and signs involving the circulatory and respiratory systems: Secondary | ICD-10-CM

## 2011-08-03 DIAGNOSIS — E785 Hyperlipidemia, unspecified: Secondary | ICD-10-CM

## 2011-08-03 NOTE — Assessment & Plan Note (Signed)
F/U duplex in 4 months.  ASA

## 2011-08-03 NOTE — Patient Instructions (Addendum)
Your physician wants you to follow-up in:  6 MONTHS WITH DR NISHAN  You will receive a reminder letter in the mail two months in advance. If you don't receive a letter, please call our office to schedule the follow-up appointment. Your physician recommends that you continue on your current medications as directed. Please refer to the Current Medication list given to you today. 

## 2011-08-03 NOTE — Assessment & Plan Note (Signed)
Discussed low carb diet.  Target hemoglobin A1c is 6.5 or less.  Continue current medications.  

## 2011-08-03 NOTE — Assessment & Plan Note (Signed)
Cholesterol is at goal.  Continue current dose of statin and diet Rx.  No myalgias or side effects.  F/U  LFT's in 6 months. Lab Results  Component Value Date   LDLCALC 131* 11/18/2010             

## 2011-08-03 NOTE — Progress Notes (Signed)
Patient ID: Curtis Frazier, male   DOB: Feb 14, 1941, 71 y.o.   MRN: [id number] [name] is seen today For F/U of CAD, carotid disease. He has a BMS in his circ from [facility] in 02/2009. GI bleed last year from AVM. He had a normal myovue in January 2011 with no ischemia and normal LV   Recent lumbar surgery by Dr. Lovell Sheehan on July 03, 2011. Marland Kitchen  Postop was noted to have anemia with hemoglobin at 7.5 and was  transfused with hemoglobin improving to 8.9. He was noted to have new-  onset atrial fibrillation, and we consulted  Echo done showed EF of 65%. No wall abnormality.  Venous Dopplers done were negative for DVT. Initially, he was placed on  amiodarone, which was discontinued as the patient converted to normal  sinus rhythm and is currently staying in sinus rhythm. The patient  remains on his Toprol 25 mg a day. Cardiac enzymes have been negative  ROS: Denies fever, malais, weight loss, blurry vision, decreased visual acuity, cough, sputum, SOB, hemoptysis, pleuritic pain, palpitaitons, heartburn, abdominal pain, melena, lower extremity edema, claudication, or rash.  All other systems reviewed and negative  General: Affect appropriate Healthy:  appears stated age HEENT: normal Neck supple with no adenopathy JVP normal left  bruits no thyromegaly Lungs clear with no wheezing and good diaphragmatic motion Heart:  S1/S2 SEM murmur, no rub, gallop or click PMI normal Abdomen: benighn, BS positve, no tenderness, no AAA no bruit.  No HSM or HJR Distal pulses intact with no bruits No edema Neuro non-focal Skin warm and dry psoriatic lesions on both legs and elbows No muscular weakness   Current Outpatient Prescriptions  Medication Sig Dispense Refill  . aspirin EC 81 MG tablet Take 81 mg by mouth daily.      . Cholecalciferol 1000 UNITS capsule Take 1,000 Units by mouth daily.        Marland Kitchen CLOBETASOL PROPIONATE E 0.05 % emollient cream Apply 1 application topically 2 (two) times daily as needed. For  skin rash.      . clopidogrel (PLAVIX) 75 MG tablet Take 1 tablet (75 mg total) by mouth daily.  30 tablet  6  . fish oil-omega-3 fatty acids 1000 MG capsule Take 1 g by mouth daily.       Marland Kitchen FOLIC ACID PO Take 1 tablet by mouth daily.       Marland Kitchen glucose blood test strip Use as instructed  100 each  12  . metoprolol succinate (TOPROL-XL) 25 MG 24 hr tablet Take 1 tablet (25 mg total) by mouth daily.  90 tablet  3  . multivitamin (THERAGRAN) per tablet Take 1 tablet by mouth daily.        . nitroGLYCERIN (NITROSTAT) 0.4 MG SL tablet Place 0.4 mg under the tongue every 5 (five) minutes as needed. For chest pain.       Marland Kitchen omeprazole (PRILOSEC) 40 MG capsule Take 40 mg by mouth daily.        . pravastatin (PRAVACHOL) 10 MG tablet Take 1 tablet (10 mg total) by mouth daily.  30 tablet  11    Allergies  Penicillins and Tetracycline  Electrocardiogram:  Assessment and Plan

## 2011-08-03 NOTE — Assessment & Plan Note (Signed)
Well controlled.  Continue current medications and low sodium Dash type diet.    

## 2011-08-03 NOTE — Assessment & Plan Note (Signed)
Maint NSR  PPT by back surgery and anemia

## 2011-08-04 ENCOUNTER — Ambulatory Visit: Payer: Medicare Other | Admitting: Rehabilitative and Restorative Service Providers"

## 2011-08-08 ENCOUNTER — Ambulatory Visit: Payer: Medicare Other | Admitting: Rehabilitative and Restorative Service Providers"

## 2011-08-11 ENCOUNTER — Ambulatory Visit: Payer: Medicare Other | Admitting: Rehabilitative and Restorative Service Providers"

## 2011-08-15 ENCOUNTER — Ambulatory Visit: Payer: Medicare Other | Admitting: Rehabilitative and Restorative Service Providers"

## 2011-08-17 ENCOUNTER — Ambulatory Visit: Payer: Medicare Other | Admitting: Rehabilitative and Restorative Service Providers"

## 2011-08-18 ENCOUNTER — Other Ambulatory Visit: Payer: Self-pay | Admitting: Family Medicine

## 2011-08-18 NOTE — Telephone Encounter (Signed)
[facility] [address] request refill Plavix 75 mg #30 x 11.

## 2011-09-13 ENCOUNTER — Encounter: Payer: Self-pay | Admitting: Physical Medicine & Rehabilitation

## 2011-09-22 ENCOUNTER — Other Ambulatory Visit: Payer: Self-pay | Admitting: Dermatology

## 2011-10-02 ENCOUNTER — Ambulatory Visit (INDEPENDENT_AMBULATORY_CARE_PROVIDER_SITE_OTHER): Payer: Medicare Other | Admitting: Family Medicine

## 2011-10-02 ENCOUNTER — Encounter: Payer: Self-pay | Admitting: Family Medicine

## 2011-10-02 VITALS — BP 110/60 | HR 71 | Temp 98.0°F | Ht 68.0 in | Wt 179.2 lb

## 2011-10-02 DIAGNOSIS — B9689 Other specified bacterial agents as the cause of diseases classified elsewhere: Secondary | ICD-10-CM

## 2011-10-02 DIAGNOSIS — J019 Acute sinusitis, unspecified: Secondary | ICD-10-CM

## 2011-10-02 MED ORDER — AZITHROMYCIN 250 MG PO TABS: ORAL_TABLET | ORAL | Status: AC

## 2011-10-02 NOTE — Assessment & Plan Note (Signed)
In immunocomp pt (on methotrexate) with viral uri  L maxillary sinus tenderness also ETD on L  Disc symptomatic care - see instructions on AVS  Will tx with zpak (is pcn all)  Update if not starting to improve in a week or if worsening

## 2011-10-02 NOTE — Progress Notes (Signed)
Subjective:    Patient ID: Curtis Frazier, male    DOB: [birth year], 71 y.o.   MRN: [id number]  HPI Here for uri symptoms  1 week  Now has facial pain L side and deep ear pain  No fever , some sweats however  Nasal congestion - clear d/c / also sniffling and sneezing  L side of throat is also scratchy and sore  Very little coughing   Tends to have post nasal drip - hard to cough up  Used to smoke - still quit    No meds over the counter   Patient Active Problem List  Diagnoses  . DIABETES MELLITUS, TYPE II  . HYPERLIPIDEMIA  . HYPERKALEMIA  . ANEMIA-UNSPECIFIED  . ESSENTIAL HYPERTENSION, BENIGN  . C A D  . GERD  . ANGIODYSPLASIA-INTESTINE  . PSORIATIC ARTHRITIS  . PSORIASIS  . ACTINIC KERATOSIS  . ARTHRITIS, RHEUMATOID  . DEGENERATIVE DISC DISEASE  . SCOLIOSIS  . MURMUR  . CAROTID BRUIT  . HX, PERSONAL, COLONIC POLYPS  . TOBACCO ABUSE, HX OF  . New onset a-fib  . Aortic stenosis  . Chest wall pain  . Back pain  . Acute bacterial sinusitis   Past Medical History  Diagnosis Date  . Other and unspecified hyperlipidemia   . Coronary atherosclerosis of unspecified type of vessel, native or graft   . Personal history of colonic polyps   . Actinic keratosis   . Psoriatic arthropathy   . Other psoriasis   . Scoliosis (and kyphoscoliosis), idiopathic   . GI bleed 12/10    cecam AVM  . Gastritis 12/10  . Hyperlipidemia     takes Pravastatin daily  . Hypertension     takes Metoprolol daily  . Myocardial infarction 2010  . Shortness of breath     with exertion  . Pneumonia     hx of-2008  . Headache     occasionally  . Joint pain   . Joint swelling   . Rheumatoid arthritis     takes Methotrexate 7pills weekly  . Degeneration of intervertebral disc, site unspecified   . Chronic back pain     scoliosis/stenosis/radiculopathy,degenerative disc disease  . Psoriasis   . Bruises easily   . Esophageal reflux     takes Omeprazole daily  . History of colonic  polyps   . Hemorrhoids   . Kidney stone     hx of  . Blood transfusion 2010  . Type II or unspecified type diabetes mellitus without mention of complication, not stated as uncontrolled     type II;controlled by diet and exercise   Past Surgical History  Procedure Date  . Rotator cuff repair 2000    left   . Colonoscopy   . Vasectomy 1979  . Ptca stent 02/2009    has one stent  . Carotid doppler 10/12    0-39% R and 60-79% left   . Cataract surgery 2012  . Cardiac catheterization 2010  . Lithotripsy 2009   History  Substance Use Topics  . Smoking status: Former Games developer  . Smokeless tobacco: Not on file   Comment: quit 2010  . Alcohol Use: No     occasional   Family History  Problem Relation Age of Onset  . Coronary artery disease Mother   . Diabetes Mother   . Heart disease Mother     CAD  . Coronary artery disease Sister   . Hypertension Sister   . Kidney failure Sister   .  Heart disease Sister     CAD  . Pancreatic cancer Brother   . Diabetes Brother   . Prostate cancer Brother   . Cancer Brother     pancreatic CA  . Diabetes Brother   . Anesthesia problems Neg Hx   . Hypotension Neg Hx   . Malignant hyperthermia Neg Hx   . Pseudochol deficiency Neg Hx    Allergies  Allergen Reactions  . Penicillins Swelling and Rash  . Tetracycline Swelling and Rash   Current Outpatient Prescriptions on File Prior to Visit  Medication Sig Dispense Refill  . aspirin EC 81 MG tablet Take 81 mg by mouth daily.      . Cholecalciferol 1000 UNITS capsule Take 1,000 Units by mouth daily.        Marland Kitchen CLOBETASOL PROPIONATE E 0.05 % emollient cream Apply 1 application topically 2 (two) times daily as needed. For skin rash.      . clopidogrel (PLAVIX) 75 MG tablet TAKE 1 TABLET (75 MG TOTAL) BY MOUTH DAILY.  30 tablet  11  . fish oil-omega-3 fatty acids 1000 MG capsule Take 1 g by mouth daily.       Marland Kitchen FOLIC ACID PO Take 1 tablet by mouth daily.       Marland Kitchen glucose blood test strip Use  as instructed  100 each  12  . methotrexate (RHEUMATREX) 2.5 MG tablet Take by mouth once a week. 7 TABS WEEKLY ./CY      . metoprolol succinate (TOPROL-XL) 25 MG 24 hr tablet Take 1 tablet (25 mg total) by mouth daily.  90 tablet  3  . multivitamin (THERAGRAN) per tablet Take 1 tablet by mouth daily.        . nitroGLYCERIN (NITROSTAT) 0.4 MG SL tablet Place 0.4 mg under the tongue every 5 (five) minutes as needed. For chest pain.       Marland Kitchen omeprazole (PRILOSEC) 40 MG capsule Take 40 mg by mouth daily.        . pravastatin (PRAVACHOL) 10 MG tablet Take 1 tablet (10 mg total) by mouth daily.  30 tablet  11     Review of Systems Review of Systems  Constitutional: Negative for fever, appetite change,  and unexpected weight change.  ENT pos for congestion and sinus pain and sore throat  Eyes: Negative for pain and visual disturbance.  Respiratory: Negative for sob or wheeze  Cardiovascular: Negative for cp or palpitations    Gastrointestinal: Negative for nausea, diarrhea and constipation.  Genitourinary: Negative for urgency and frequency.  Skin: Negative for pallor or rash   Neurological: Negative for weakness, light-headedness, numbness and headaches.  Hematological: Negative for adenopathy. Does not bruise/bleed easily.  Psychiatric/Behavioral: Negative for dysphoric mood. The patient is not nervous/anxious.          Objective:   Physical Exam  Constitutional: He appears well-developed and well-nourished. No distress.  HENT:  Head: Normocephalic and atraumatic.  Right Ear: External ear normal.  Left Ear: External ear normal.  Mouth/Throat: No oropharyngeal exudate.       Nares are injected and congested   L maxillary sinus tenderness TMs are generally dull Clear post nasal drip  Eyes: Conjunctivae and EOM are normal. Pupils are equal, round, and reactive to light. Right eye exhibits no discharge. Left eye exhibits no discharge. No scleral icterus.  Neck: Normal range of motion.  Neck supple.  Cardiovascular: Normal rate and regular rhythm.   Pulmonary/Chest: Effort normal and breath sounds normal. No respiratory  distress. He has no wheezes. He has no rales.       Diffusely distant bs   Lymphadenopathy:    He has no cervical adenopathy.  Skin: Skin is warm and dry. No rash noted.  Psychiatric: He has a normal mood and affect.          Assessment & Plan:

## 2011-10-02 NOTE — Patient Instructions (Signed)
For your cold and sinus infection -drink lots of fluids Try some nasal saline spray in nose - it helps congestion Also mucinex helps congestion over the counter Take the zithromax as directed  Breath steam from shower  Update if not starting to improve in a week or if worsening

## 2011-10-06 ENCOUNTER — Other Ambulatory Visit: Payer: Self-pay | Admitting: *Deleted

## 2011-10-06 DIAGNOSIS — I6529 Occlusion and stenosis of unspecified carotid artery: Secondary | ICD-10-CM

## 2011-10-06 MED ORDER — METOPROLOL SUCCINATE ER 25 MG PO TB24: 25.0000 mg | ORAL_TABLET | Freq: Every day | ORAL | Status: DC

## 2011-10-10 ENCOUNTER — Encounter (INDEPENDENT_AMBULATORY_CARE_PROVIDER_SITE_OTHER): Payer: Medicare Other

## 2011-10-10 DIAGNOSIS — I6529 Occlusion and stenosis of unspecified carotid artery: Secondary | ICD-10-CM

## 2011-12-12 ENCOUNTER — Other Ambulatory Visit: Payer: Self-pay | Admitting: *Deleted

## 2011-12-12 MED ORDER — PRAVASTATIN SODIUM 10 MG PO TABS: 10.0000 mg | ORAL_TABLET | Freq: Every day | ORAL | Status: DC

## 2011-12-12 NOTE — Telephone Encounter (Signed)
He is due  Please set him up for a f/u with me in august-will do labs then Will refill electronically

## 2011-12-12 NOTE — Telephone Encounter (Signed)
Called in Rx as directed.  

## 2011-12-12 NOTE — Telephone Encounter (Signed)
Received faxed refill request from pharmacy for Pravastatin. Please advise when patient needs to have Lipid panel done?

## 2012-01-03 ENCOUNTER — Encounter: Payer: Self-pay | Admitting: Family Medicine

## 2012-01-03 ENCOUNTER — Ambulatory Visit (INDEPENDENT_AMBULATORY_CARE_PROVIDER_SITE_OTHER)
Admission: RE | Admit: 2012-01-03 | Discharge: 2012-01-03 | Disposition: A | Discharge status: Home / Self Care | Payer: Medicare Other | Source: Ambulatory Visit | Attending: Family Medicine | Admitting: Family Medicine

## 2012-01-03 ENCOUNTER — Ambulatory Visit (INDEPENDENT_AMBULATORY_CARE_PROVIDER_SITE_OTHER): Payer: Medicare Other | Admitting: Family Medicine

## 2012-01-03 VITALS — BP 112/64 | HR 55 | Temp 98.0°F | Ht 68.0 in | Wt 176.2 lb

## 2012-01-03 DIAGNOSIS — R05 Cough: Secondary | ICD-10-CM

## 2012-01-03 NOTE — Assessment & Plan Note (Signed)
Persistent on and off since April - junky but not productive with tightness, less so wheeze Also post nasal drip  Will tx that with otc antihistamine  cxr today I do suspect some component of copd at this time - may consider inhaled corticosteroid or spiriva  Also likely needs spirometry

## 2012-01-03 NOTE — Patient Instructions (Addendum)
Chest xray today for cough Update if not starting to improve in a week or if worsening   I may put you on an inhaler to help breathing  For runny nose - try claritin or allegra or zyrtec over the counter as directed  We will update you with a plan

## 2012-01-03 NOTE — Progress Notes (Signed)
Subjective:    Patient ID: Curtis Frazier, male    DOB: 1941-01-18, 71 y.o.   MRN: [id number]  HPI Never really got better since April 100 %  Still suffers from chest congestion Is coughing - hears phlegm rattle and cannot get it out  Also sinus symptoms- runny nose all the time  Monday had a sinus headache- better today  No green or yellow nasal discharge- all clear   Not wheezing  Not sob any more than normal   (not much different since quitting smoking 3 years ago)   No fever at all   Uses nasal saline spray  No inhalers   Right now is on 1mg  of prednisone - may go off of it soon   Patient Active Problem List  Diagnosis  . DIABETES MELLITUS, TYPE II  . HYPERLIPIDEMIA  . HYPERKALEMIA  . ANEMIA-UNSPECIFIED  . ESSENTIAL HYPERTENSION, BENIGN  . C A D  . GERD  . ANGIODYSPLASIA-INTESTINE  . PSORIATIC ARTHRITIS  . PSORIASIS  . ACTINIC KERATOSIS  . ARTHRITIS, RHEUMATOID  . DEGENERATIVE DISC DISEASE  . SCOLIOSIS  . MURMUR  . CAROTID BRUIT  . HX, PERSONAL, COLONIC POLYPS  . TOBACCO ABUSE, HX OF  . New onset a-fib  . Aortic stenosis  . Chest wall pain  . Back pain  . Acute bacterial sinusitis  . Cough   Past Medical History  Diagnosis Date  . Other and unspecified hyperlipidemia   . Coronary atherosclerosis of unspecified type of vessel, native or graft   . Personal history of colonic polyps   . Actinic keratosis   . Psoriatic arthropathy   . Other psoriasis   . Scoliosis (and kyphoscoliosis), idiopathic   . GI bleed 12/10    cecam AVM  . Gastritis 12/10  . Hyperlipidemia     takes Pravastatin daily  . Hypertension     takes Metoprolol daily  . Myocardial infarction 2010  . Shortness of breath     with exertion  . Pneumonia     hx of-2008  . Headache     occasionally  . Joint pain   . Joint swelling   . Rheumatoid arthritis     takes Methotrexate 7pills weekly  . Degeneration of intervertebral disc, site unspecified   . Chronic back pain    scoliosis/stenosis/radiculopathy,degenerative disc disease  . Psoriasis   . Bruises easily   . Esophageal reflux     takes Omeprazole daily  . History of colonic polyps   . Hemorrhoids   . Kidney stone     hx of  . Blood transfusion 2010  . Type II or unspecified type diabetes mellitus without mention of complication, not stated as uncontrolled     type II;controlled by diet and exercise   Past Surgical History  Procedure Date  . Rotator cuff repair 2000    left   . Colonoscopy   . Vasectomy 1979  . Ptca stent 02/2009    has one stent  . Carotid doppler 10/12    0-39% R and 60-79% left   . Cataract surgery 2012  . Cardiac catheterization 2010  . Lithotripsy 2009   History  Substance Use Topics  . Smoking status: Former Games developer  . Smokeless tobacco: Not on file   Comment: quit 2010  . Alcohol Use: No     occasional   Family History  Problem Relation Age of Onset  . Coronary artery disease Mother   . Diabetes Mother   . Heart  disease Mother     CAD  . Coronary artery disease Sister   . Hypertension Sister   . Kidney failure Sister   . Heart disease Sister     CAD  . Pancreatic cancer Brother   . Diabetes Brother   . Prostate cancer Brother   . Cancer Brother     pancreatic CA  . Diabetes Brother   . Anesthesia problems Neg Hx   . Hypotension Neg Hx   . Malignant hyperthermia Neg Hx   . Pseudochol deficiency Neg Hx    Allergies  Allergen Reactions  . Penicillins Swelling and Rash  . Tetracycline Swelling and Rash   Current Outpatient Prescriptions on File Prior to Visit  Medication Sig Dispense Refill  . aspirin EC 81 MG tablet Take 81 mg by mouth daily.      . Cholecalciferol 1000 UNITS capsule Take 1,000 Units by mouth daily.        Marland Kitchen CLOBETASOL PROPIONATE E 0.05 % emollient cream Apply 1 application topically 2 (two) times daily as needed. For skin rash.      . clopidogrel (PLAVIX) 75 MG tablet TAKE 1 TABLET (75 MG TOTAL) BY MOUTH DAILY.  30 tablet   11  . fish oil-omega-3 fatty acids 1000 MG capsule Take 1 g by mouth daily.       Marland Kitchen FOLIC ACID PO Take 1 tablet by mouth daily.       Marland Kitchen glucose blood test strip Use as instructed  100 each  12  . methotrexate (RHEUMATREX) 2.5 MG tablet Take by mouth once a week. 7 TABS WEEKLY ./CY      . metoprolol succinate (TOPROL-XL) 25 MG 24 hr tablet Take 1 tablet (25 mg total) by mouth daily.  90 tablet  3  . multivitamin (THERAGRAN) per tablet Take 1 tablet by mouth daily.        . nitroGLYCERIN (NITROSTAT) 0.4 MG SL tablet Place 0.4 mg under the tongue every 5 (five) minutes as needed. For chest pain.       Marland Kitchen omeprazole (PRILOSEC) 40 MG capsule Take 40 mg by mouth daily.        . pravastatin (PRAVACHOL) 10 MG tablet Take 1 tablet (10 mg total) by mouth daily.  30 tablet  5        Review of Systems Review of Systems  Constitutional: Negative for fever, appetite change, fatigue and unexpected weight change.  Eyes: Negative for pain and visual disturbance.  ENT pos for some post nasal drip and phlegm in throat  Respiratory: pos for cough, neg for wheeze / sob    Cardiovascular: Negative for cp or palpitations    Gastrointestinal: Negative for nausea, diarrhea and constipation.  Genitourinary: Negative for urgency and frequency.  Skin: Negative for pallor or rash   Neurological: Negative for weakness, light-headedness, numbness and headaches.  Hematological: Negative for adenopathy. Does not bruise/bleed easily.  Psychiatric/Behavioral: Negative for dysphoric mood. The patient is not nervous/anxious.         Objective:   Physical Exam  Constitutional: He appears well-developed and well-nourished. No distress.  HENT:  Head: Normocephalic and atraumatic.  Mouth/Throat: Oropharynx is clear and moist.  Eyes: Conjunctivae and EOM are normal. Pupils are equal, round, and reactive to light. Right eye exhibits no discharge. Left eye exhibits no discharge. No scleral icterus.  Neck: Normal range of  motion. Neck supple. No JVD present. No thyromegaly present.  Cardiovascular: Normal rate, regular rhythm and intact distal pulses.  Exam  reveals no gallop.   Pulmonary/Chest: Effort normal and breath sounds normal. No respiratory distress. He has no wheezes. He has no rales. He exhibits no tenderness.       Diffusely distant bs   Abdominal: Soft. Bowel sounds are normal.  Musculoskeletal: He exhibits no edema.  Lymphadenopathy:    He has no cervical adenopathy.  Neurological: He is alert. He has normal reflexes.  Skin: Skin is warm and dry. No rash noted. No erythema. No pallor.       No cyanosis  Psychiatric: He has a normal mood and affect.          Assessment & Plan:

## 2012-01-11 LAB — HM DIABETES EYE EXAM: HM Diabetic Eye Exam: NORMAL

## 2012-01-19 ENCOUNTER — Encounter: Payer: Self-pay | Admitting: Family Medicine

## 2012-01-19 ENCOUNTER — Ambulatory Visit (INDEPENDENT_AMBULATORY_CARE_PROVIDER_SITE_OTHER): Payer: Medicare Other | Admitting: Family Medicine

## 2012-01-19 VITALS — BP 110/58 | HR 57 | Temp 98.2°F | Ht 68.5 in | Wt 181.2 lb

## 2012-01-19 DIAGNOSIS — D649 Anemia, unspecified: Secondary | ICD-10-CM

## 2012-01-19 DIAGNOSIS — I10 Essential (primary) hypertension: Secondary | ICD-10-CM

## 2012-01-19 DIAGNOSIS — E119 Type 2 diabetes mellitus without complications: Secondary | ICD-10-CM

## 2012-01-19 DIAGNOSIS — M353 Polymyalgia rheumatica: Secondary | ICD-10-CM

## 2012-01-19 DIAGNOSIS — R490 Dysphonia: Secondary | ICD-10-CM

## 2012-01-19 DIAGNOSIS — K219 Gastro-esophageal reflux disease without esophagitis: Secondary | ICD-10-CM

## 2012-01-19 DIAGNOSIS — E785 Hyperlipidemia, unspecified: Secondary | ICD-10-CM

## 2012-01-19 DIAGNOSIS — B370 Candidal stomatitis: Secondary | ICD-10-CM

## 2012-01-19 LAB — CBC WITH DIFFERENTIAL/PLATELET
Basophils Relative: 0.2 % (ref 0.0–3.0)
Eosinophils Relative: 0.2 % (ref 0.0–5.0)
Hemoglobin: 11.1 g/dL — ABNORMAL LOW (ref 13.0–17.0)
Lymphocytes Relative: 28 % (ref 12.0–46.0)
MCV: 95.5 fl (ref 78.0–100.0)
Monocytes Absolute: 0.6 10*3/uL (ref 0.1–1.0)
Neutrophils Relative %: 63.6 % (ref 43.0–77.0)
RBC: 3.58 Mil/uL — ABNORMAL LOW (ref 4.22–5.81)
WBC: 7.8 10*3/uL (ref 4.5–10.5)

## 2012-01-19 LAB — COMPREHENSIVE METABOLIC PANEL
Albumin: 3.6 g/dL (ref 3.5–5.2)
BUN: 26 mg/dL — ABNORMAL HIGH (ref 6–23)
Calcium: 9.2 mg/dL (ref 8.4–10.5)
Chloride: 103 mEq/L (ref 96–112)
Glucose, Bld: 162 mg/dL — ABNORMAL HIGH (ref 70–99)
Potassium: 5 mEq/L (ref 3.5–5.1)

## 2012-01-19 LAB — HEMOGLOBIN A1C: Hgb A1c MFr Bld: 9 % — ABNORMAL HIGH (ref 4.6–6.5)

## 2012-01-19 LAB — TSH: TSH: 0.22 u[IU]/mL — ABNORMAL LOW (ref 0.35–5.50)

## 2012-01-19 MED ORDER — METFORMIN HCL 500 MG PO TABS: 500.0000 mg | ORAL_TABLET | Freq: Two times a day (BID) | ORAL | Status: DC

## 2012-01-19 MED ORDER — NYSTATIN 100000 UNIT/ML MT SUSP: OROMUCOSAL | Status: DC

## 2012-01-19 NOTE — Patient Instructions (Addendum)
Take the mouthwash as directed for thrush for 10 days Let me know if this does not help hoarseness/ throat clearing  Take metformin twice daily for sugar control Labs today Follow up in 3 months Try to follow diabetic diet

## 2012-01-19 NOTE — Progress Notes (Signed)
Subjective:    Patient ID: Curtis Frazier, male    DOB: 09-06-1940, 71 y.o.   MRN: [id number]  HPI  Here for f/u chronic conditions  Having a lot hoarseness - had not calmed down since last visit  Not coughing  Just clearing throat  Spits out mucous- clear to white at times No fever  No sob  Doing well with his heart - stable   Is on omeprazole - does not feel heartburn  Still has runny nose frequently -- does have seasonal allergies - worst in spring  He is currently on low dose prednisone for PMR  bp is good    Today No cp or palpitations or headaches or edema  No side effects to medicines   BP Readings from Last 3 Encounters:  01/19/12 110/58  01/03/12 112/64  10/02/11 110/60    Sugar control Lab Results  Component Value Date   HGBA1C 7.0* 11/18/2010   due for check  Diet  No meds Sugars are high 180 in ams  No meds   Anemia  Lab Results  Component Value Date   WBC 9.9 07/12/2011   HGB 10.2* 07/12/2011   HCT 30.0* 07/12/2011   MCV 94.3 07/12/2011   PLT 406* 07/12/2011    Due for chol check   Was recently dx with PMR  Is on prednisone 10 mg day - in early part of a long taper  Sugars are high   Patient Active Problem List  Diagnosis  . DIABETES MELLITUS, TYPE II  . HYPERLIPIDEMIA  . HYPERKALEMIA  . ANEMIA-UNSPECIFIED  . ESSENTIAL HYPERTENSION, BENIGN  . C A D  . GERD  . ANGIODYSPLASIA-INTESTINE  . PSORIATIC ARTHRITIS  . PSORIASIS  . ACTINIC KERATOSIS  . ARTHRITIS, RHEUMATOID  . DEGENERATIVE DISC DISEASE  . SCOLIOSIS  . MURMUR  . CAROTID BRUIT  . HX, PERSONAL, COLONIC POLYPS  . TOBACCO ABUSE, HX OF  . New onset a-fib  . Aortic stenosis  . Chest wall pain  . Back pain  . Acute bacterial sinusitis  . Cough   Past Medical History  Diagnosis Date  . Other and unspecified hyperlipidemia   . Coronary atherosclerosis of unspecified type of vessel, native or graft   . Personal history of colonic polyps   . Actinic keratosis   . Psoriatic  arthropathy   . Other psoriasis   . Scoliosis (and kyphoscoliosis), idiopathic   . GI bleed 12/10    cecam AVM  . Gastritis 12/10  . Hyperlipidemia     takes Pravastatin daily  . Hypertension     takes Metoprolol daily  . Myocardial infarction 2010  . Shortness of breath     with exertion  . Pneumonia     hx of-2008  . Headache     occasionally  . Joint pain   . Joint swelling   . Rheumatoid arthritis     takes Methotrexate 7pills weekly  . Degeneration of intervertebral disc, site unspecified   . Chronic back pain     scoliosis/stenosis/radiculopathy,degenerative disc disease  . Psoriasis   . Bruises easily   . Esophageal reflux     takes Omeprazole daily  . History of colonic polyps   . Hemorrhoids   . Kidney stone     hx of  . Blood transfusion 2010  . Type II or unspecified type diabetes mellitus without mention of complication, not stated as uncontrolled     type II;controlled by diet and exercise   Past  Surgical History  Procedure Date  . Rotator cuff repair 2000    left   . Colonoscopy   . Vasectomy 1979  . Ptca stent 02/2009    has one stent  . Carotid doppler 10/12    0-39% R and 60-79% left   . Cataract surgery 2012  . Cardiac catheterization 2010  . Lithotripsy 2009   History  Substance Use Topics  . Smoking status: Former Games developer  . Smokeless tobacco: Not on file   Comment: quit 2010  . Alcohol Use: No     occasional   Family History  Problem Relation Age of Onset  . Coronary artery disease Mother   . Diabetes Mother   . Heart disease Mother     CAD  . Coronary artery disease Sister   . Hypertension Sister   . Kidney failure Sister   . Heart disease Sister     CAD  . Pancreatic cancer Brother   . Diabetes Brother   . Prostate cancer Brother   . Cancer Brother     pancreatic CA  . Diabetes Brother   . Anesthesia problems Neg Hx   . Hypotension Neg Hx   . Malignant hyperthermia Neg Hx   . Pseudochol deficiency Neg Hx     Allergies  Allergen Reactions  . Penicillins Swelling and Rash  . Tetracycline Swelling and Rash   Current Outpatient Prescriptions on File Prior to Visit  Medication Sig Dispense Refill  . aspirin EC 81 MG tablet Take 81 mg by mouth daily.      . Cholecalciferol 1000 UNITS capsule Take 1,000 Units by mouth daily.        . clopidogrel (PLAVIX) 75 MG tablet TAKE 1 TABLET (75 MG TOTAL) BY MOUTH DAILY.  30 tablet  11  . fish oil-omega-3 fatty acids 1000 MG capsule Take 1 g by mouth daily.       Marland Kitchen FOLIC ACID PO Take 1 tablet by mouth daily.       Marland Kitchen glucose blood test strip Use as instructed  100 each  12  . methotrexate (RHEUMATREX) 2.5 MG tablet Take by mouth once a week. 7 TABS WEEKLY ./CY      . metoprolol succinate (TOPROL-XL) 25 MG 24 hr tablet Take 1 tablet (25 mg total) by mouth daily.  90 tablet  3  . multivitamin (THERAGRAN) per tablet Take 1 tablet by mouth daily.        . nitroGLYCERIN (NITROSTAT) 0.4 MG SL tablet Place 0.4 mg under the tongue every 5 (five) minutes as needed. For chest pain.       Marland Kitchen omeprazole (PRILOSEC) 40 MG capsule Take 40 mg by mouth daily.        . pravastatin (PRAVACHOL) 10 MG tablet Take 1 tablet (10 mg total) by mouth daily.  30 tablet  5  . predniSONE (DELTASONE) 1 MG tablet Take 1 mg by mouth daily.      Marland Kitchen CLOBETASOL PROPIONATE E 0.05 % emollient cream Apply 1 application topically 2 (two) times daily as needed. For skin rash.            Review of Systems  Review of Systems  Constitutional: Negative for fever, appetite change, fatigue and unexpected weight change.  Eyes: Negative for pain and visual disturbance.  Respiratory: Negative for cough and shortness of breath.   Cardiovascular: Negative for cp or palpitations    Gastrointestinal: Negative for nausea, diarrhea and constipation.  Genitourinary: Negative for urgency and frequency.  Skin: Negative for pallor or rash   MSK pos for joint pain and back pain that is now improved on  prednisone, neg for joint swelling  Neurological: Negative for weakness, light-headedness, numbness and headaches.  Hematological: Negative for adenopathy. Does not bruise/bleed easily.  Psychiatric/Behavioral: Negative for dysphoric mood. The patient is not nervous/anxious.      Objective:   Physical Exam  Constitutional: He appears well-developed and well-nourished. No distress.  HENT:  Head: Normocephalic and atraumatic.  Right Ear: External ear normal.  Left Ear: External ear normal.  Nose: Nose normal.       Scant white non scrapable coating on tongue resembling thrush  Eyes: Conjunctivae and EOM are normal. Pupils are equal, round, and reactive to light. No scleral icterus.  Neck: Normal range of motion. Neck supple. No JVD present. Carotid bruit is not present. No tracheal deviation present. No thyromegaly present.  Cardiovascular: Normal rate, regular rhythm, normal heart sounds and intact distal pulses.  Exam reveals no gallop.   Pulmonary/Chest: Effort normal and breath sounds normal. No respiratory distress. He has no wheezes.  Abdominal: Soft. Bowel sounds are normal. He exhibits no distension, no abdominal bruit and no mass. There is no tenderness.  Musculoskeletal: Normal range of motion. He exhibits no edema and no tenderness.       No acute joint changes   Lymphadenopathy:    He has no cervical adenopathy.  Neurological: He is alert. He has normal reflexes. No cranial nerve deficit. He exhibits normal muscle tone. Coordination normal.  Skin: Skin is warm and dry. No rash noted. No erythema. No pallor.       Healed scar on back  Psychiatric: He has a normal mood and affect.          Assessment & Plan:

## 2012-01-20 ENCOUNTER — Encounter: Payer: Self-pay | Admitting: Family Medicine

## 2012-01-20 DIAGNOSIS — M353 Polymyalgia rheumatica: Secondary | ICD-10-CM | POA: Insufficient documentation

## 2012-01-20 HISTORY — DX: Polymyalgia rheumatica: M35.3

## 2012-01-20 NOTE — Assessment & Plan Note (Signed)
Expect sugars to be up now on prednisone for pmr Will begin metformin and regular sugar checks bid Rev low glycemic diet, and exercise  F/u 3 mo a1c today

## 2012-01-20 NOTE — Assessment & Plan Note (Signed)
Much imp after starting pred taper by rheum Overall will need to watch for inc sugar and other side eff

## 2012-01-20 NOTE — Assessment & Plan Note (Signed)
In pt with CAD on statin Lab today and update Rev low sat fat diet

## 2012-01-20 NOTE — Assessment & Plan Note (Signed)
In pt on prednisone Will tx with nystatin liquid If no imp in throat symptoms will have to consider further eval

## 2012-01-20 NOTE — Assessment & Plan Note (Signed)
Was worsened after his back surg Was on iron for a while Lab today and update

## 2012-01-20 NOTE — Assessment & Plan Note (Signed)
Pt is not having heartburn on current medicine - but still hoarse/ throat clearing  If this is not resolved with tx of thrush -will let me know and may need addn reflux treatment  Hesitant to inc ppi due to plavix- will consider addn of H2 blocker

## 2012-01-20 NOTE — Assessment & Plan Note (Signed)
Poss rel to thrush If not imp with tx of that will consider adding H2 blocker for GERD May need ENT ref then if no further imp He is constantly clearing throat as well as former smoker as well

## 2012-01-20 NOTE — Assessment & Plan Note (Signed)
bp in fair control at this time  No changes needed  Disc lifstyle change with low sodium diet and exercise  Lab today 

## 2012-01-22 ENCOUNTER — Telehealth: Payer: Self-pay | Admitting: Family Medicine

## 2012-01-22 DIAGNOSIS — R7989 Other specified abnormal findings of blood chemistry: Secondary | ICD-10-CM

## 2012-01-22 DIAGNOSIS — D649 Anemia, unspecified: Secondary | ICD-10-CM

## 2012-01-22 DIAGNOSIS — D638 Anemia in other chronic diseases classified elsewhere: Secondary | ICD-10-CM

## 2012-01-22 NOTE — Telephone Encounter (Signed)
Message copied by Judy Pimple on Mon Jan 22, 2012 11:35 AM ------      Message from: Willey Blade      Created: Mon Jan 22, 2012  8:23 AM       Patient informed, letter sent.patient states he is not taking iron pills and has never had a problem with his Thyroid

## 2012-01-22 NOTE — Telephone Encounter (Signed)
I want to do some iron studies and more thyroid labs on him Please schedule non fasting labs  Will update with plan when these get back

## 2012-01-23 NOTE — Telephone Encounter (Signed)
Left message on patient vm instructing him to call and schedule an appointment for non fasting labs.

## 2012-02-09 ENCOUNTER — Other Ambulatory Visit (INDEPENDENT_AMBULATORY_CARE_PROVIDER_SITE_OTHER): Payer: Medicare Other

## 2012-02-09 DIAGNOSIS — D649 Anemia, unspecified: Secondary | ICD-10-CM

## 2012-02-09 DIAGNOSIS — R946 Abnormal results of thyroid function studies: Secondary | ICD-10-CM

## 2012-02-09 DIAGNOSIS — R7989 Other specified abnormal findings of blood chemistry: Secondary | ICD-10-CM

## 2012-02-09 LAB — TSH: TSH: 0.49 u[IU]/mL (ref 0.35–5.50)

## 2012-02-09 LAB — IBC PANEL
Iron: 158 ug/dL (ref 42–165)
Saturation Ratios: 40.6 % (ref 20.0–50.0)
Transferrin: 278.2 mg/dL (ref 212.0–360.0)

## 2012-02-09 LAB — T4, FREE: Free T4: 0.97 ng/dL (ref 0.60–1.60)

## 2012-03-15 ENCOUNTER — Ambulatory Visit: Payer: Medicare Other | Admitting: Family Medicine

## 2012-03-19 ENCOUNTER — Ambulatory Visit (INDEPENDENT_AMBULATORY_CARE_PROVIDER_SITE_OTHER): Payer: Medicare Other | Admitting: Family Medicine

## 2012-03-19 ENCOUNTER — Encounter: Payer: Self-pay | Admitting: Family Medicine

## 2012-03-19 VITALS — BP 122/68 | HR 74 | Temp 98.7°F | Ht 68.5 in | Wt 184.2 lb

## 2012-03-19 DIAGNOSIS — H9209 Otalgia, unspecified ear: Secondary | ICD-10-CM

## 2012-03-19 DIAGNOSIS — H9202 Otalgia, left ear: Secondary | ICD-10-CM

## 2012-03-19 LAB — SEDIMENTATION RATE: Sed Rate: 15 mm/hr (ref 0–22)

## 2012-03-19 MED ORDER — FLUTICASONE PROPIONATE 50 MCG/ACT NA SUSP: 2.0000 | Freq: Every day | NASAL | Status: DC

## 2012-03-19 NOTE — Assessment & Plan Note (Signed)
This is ongoing in a complex medical pt with hx of PMR on tapering dose of prednisone  With rad to temple/ face / neck and also some TMJ tenderness/ no hearing or vision change Diff incl ETD/ TMJ/ TA Check ESR and CRP today and call for last rheum labs also  Trial of flonase

## 2012-03-19 NOTE — Patient Instructions (Addendum)
Please send for last note and labs from rheumatologist  Lab today for your ear pain - to see if this could be inflammatory Your ear pain could come from an allergy problem/ congestion/ an inflammation problem or a jaw problem- it is hard to tell Minimize chewing/ no gum/ use a straw and a warm compress is ok for pain  Try the flonase nasal spray for congestion  We will make a plan when labs return

## 2012-03-19 NOTE — Progress Notes (Signed)
Subjective:    Patient ID: Curtis Frazier, male    DOB: 1940-12-05, 71 y.o.   MRN: [id number]  HPI Here for ear pain  L ear is hurting the worst  A dull constant ache- feels full -makes his head and neck hurt too  Gives him a headache in temple No trouble hearing  No vision problem  Is still on prednisone right now - 9 mg per day  Dr Lissa Hoard- rheumatologist is treating  For PMR Is supposed to drop 1 mg on the 18th   Does have some congestion- not too bad No purulent drainage  No fever occ sweats No chills  Does have a persistant cough - a little productive in the ams Has totally quit smoking   Patient Active Problem List  Diagnosis  . DIABETES MELLITUS, TYPE II  . HYPERLIPIDEMIA  . HYPERKALEMIA  . ANEMIA-UNSPECIFIED  . ESSENTIAL HYPERTENSION, BENIGN  . C A D  . GERD  . ANGIODYSPLASIA-INTESTINE  . PSORIATIC ARTHRITIS  . PSORIASIS  . ACTINIC KERATOSIS  . ARTHRITIS, RHEUMATOID  . DEGENERATIVE DISC DISEASE  . SCOLIOSIS  . MURMUR  . CAROTID BRUIT  . HX, PERSONAL, COLONIC POLYPS  . TOBACCO ABUSE, HX OF  . New onset a-fib  . Aortic stenosis  . Chest wall pain  . Back pain  . Acute bacterial sinusitis  . Cough  . Thrush  . Hoarseness  . PMR (polymyalgia rheumatica)  . Low TSH level   Past Medical History  Diagnosis Date  . Other and unspecified hyperlipidemia   . Coronary atherosclerosis of unspecified type of vessel, native or graft   . Personal history of colonic polyps   . Actinic keratosis   . Psoriatic arthropathy   . Other psoriasis   . Scoliosis (and kyphoscoliosis), idiopathic   . GI bleed 12/10    cecam AVM  . Gastritis 12/10  . Hyperlipidemia     takes Pravastatin daily  . Hypertension     takes Metoprolol daily  . Myocardial infarction 2010  . Shortness of breath     with exertion  . Pneumonia     hx of-2008  . Headache     occasionally  . Joint pain   . Joint swelling   . Rheumatoid arthritis     takes Methotrexate 7pills weekly  .  Degeneration of intervertebral disc, site unspecified   . Chronic back pain     scoliosis/stenosis/radiculopathy,degenerative disc disease  . Psoriasis   . Bruises easily   . Esophageal reflux     takes Omeprazole daily  . History of colonic polyps   . Hemorrhoids   . Kidney stone     hx of  . Blood transfusion 2010  . Type II or unspecified type diabetes mellitus without mention of complication, not stated as uncontrolled     type II;controlled by diet and exercise  . PMR (polymyalgia rheumatica) 01/20/2012    tx by specialist on a long prednisone taper   Past Surgical History  Procedure Date  . Rotator cuff repair 2000    left   . Colonoscopy   . Vasectomy 1979  . Ptca stent 02/2009    has one stent  . Carotid doppler 10/12    0-39% R and 60-79% left   . Cataract surgery 2012  . Cardiac catheterization 2010  . Lithotripsy 2009   History  Substance Use Topics  . Smoking status: Former Games developer  . Smokeless tobacco: Not on file   Comment: quit  2010  . Alcohol Use: No     occasional   Family History  Problem Relation Age of Onset  . Coronary artery disease Mother   . Diabetes Mother   . Heart disease Mother     CAD  . Coronary artery disease Sister   . Hypertension Sister   . Kidney failure Sister   . Heart disease Sister     CAD  . Pancreatic cancer Brother   . Diabetes Brother   . Prostate cancer Brother   . Cancer Brother     pancreatic CA  . Diabetes Brother   . Anesthesia problems Neg Hx   . Hypotension Neg Hx   . Malignant hyperthermia Neg Hx   . Pseudochol deficiency Neg Hx    Allergies  Allergen Reactions  . Penicillins Swelling and Rash  . Tetracycline Swelling and Rash   Current Outpatient Prescriptions on File Prior to Visit  Medication Sig Dispense Refill  . aspirin EC 81 MG tablet Take 81 mg by mouth daily.      . Cholecalciferol 1000 UNITS capsule Take 1,000 Units by mouth daily.        Marland Kitchen CLOBETASOL PROPIONATE E 0.05 % emollient cream  Apply 1 application topically 2 (two) times daily as needed. For skin rash.      . clopidogrel (PLAVIX) 75 MG tablet TAKE 1 TABLET (75 MG TOTAL) BY MOUTH DAILY.  30 tablet  11  . fish oil-omega-3 fatty acids 1000 MG capsule Take 1 g by mouth daily.       Marland Kitchen FOLIC ACID PO Take 1 tablet by mouth daily.       Marland Kitchen glucose blood test strip Use as instructed  100 each  12  . metFORMIN (GLUCOPHAGE) 500 MG tablet Take 1 tablet (500 mg total) by mouth 2 (two) times daily with a meal. For diabetes/ sugar control  60 tablet  11  . methotrexate (RHEUMATREX) 2.5 MG tablet Take by mouth once a week. 7 TABS WEEKLY ./CY      . metoprolol succinate (TOPROL-XL) 25 MG 24 hr tablet Take 1 tablet (25 mg total) by mouth daily.  90 tablet  3  . multivitamin (THERAGRAN) per tablet Take 1 tablet by mouth daily.        . nitroGLYCERIN (NITROSTAT) 0.4 MG SL tablet Place 0.4 mg under the tongue every 5 (five) minutes as needed. For chest pain.       Marland Kitchen omeprazole (PRILOSEC) 40 MG capsule Take 40 mg by mouth daily.        . pravastatin (PRAVACHOL) 10 MG tablet Take 1 tablet (10 mg total) by mouth daily.  30 tablet  5  . predniSONE (DELTASONE) 1 MG tablet Take 1 mg by mouth daily.          Review of Systems Review of Systems  Constitutional: Negative for fever, appetite change, fatigue and unexpected weight change.  Eyes: Negative for pain and visual disturbance.  ENT pos for ear and facial pain on L without rash/ neg for sore throat  Respiratory: Negative for sob and wheeze Cardiovascular: Negative for cp or palpitations    Gastrointestinal: Negative for nausea, diarrhea and constipation.  Genitourinary: Negative for urgency and frequency.  Skin: Negative for pallor or rash   Neurological: Negative for weakness, light-headedness, numbness and headaches.  Hematological: Negative for adenopathy. Does not bruise/bleed easily.  Psychiatric/Behavioral: Negative for dysphoric mood. The patient is not nervous/anxious.          Objective:  Physical Exam  Constitutional: He appears well-developed and well-nourished. No distress.  HENT:  Head: Normocephalic and atraumatic.  Right Ear: External ear normal.  Left Ear: External ear normal.  Mouth/Throat: Oropharynx is clear and moist. No oropharyngeal exudate.       TMs clear bilat  No external ear tenderness No temporal tenderness Nares are boggy and somewhat congested No sinus tenderness Throat is clear   Eyes: Conjunctivae normal and EOM are normal. Pupils are equal, round, and reactive to light. Right eye exhibits no discharge. Left eye exhibits no discharge. No scleral icterus.  Neck: Normal range of motion. Neck supple. Carotid bruit is not present. No thyromegaly present.  Cardiovascular: Normal rate and regular rhythm.   Murmur heard. Pulmonary/Chest: Effort normal and breath sounds normal. No respiratory distress. He has no wheezes.       Diffusely distant bs  No rhonchi or crackles  Musculoskeletal: He exhibits no edema.  Lymphadenopathy:    He has no cervical adenopathy.  Neurological: He is alert. He has normal reflexes. No cranial nerve deficit. He exhibits normal muscle tone. Coordination normal.  Skin: Skin is warm and dry. No rash noted. No erythema. No pallor.  Psychiatric: He has a normal mood and affect.          Assessment & Plan:

## 2012-03-22 ENCOUNTER — Ambulatory Visit: Payer: Medicare Other | Admitting: Family Medicine

## 2012-03-27 ENCOUNTER — Other Ambulatory Visit: Payer: Self-pay | Admitting: Dermatology

## 2012-03-28 ENCOUNTER — Other Ambulatory Visit: Payer: Self-pay | Admitting: *Deleted

## 2012-03-28 DIAGNOSIS — I6529 Occlusion and stenosis of unspecified carotid artery: Secondary | ICD-10-CM

## 2012-04-09 ENCOUNTER — Encounter (INDEPENDENT_AMBULATORY_CARE_PROVIDER_SITE_OTHER): Payer: Medicare Other

## 2012-04-09 DIAGNOSIS — I6529 Occlusion and stenosis of unspecified carotid artery: Secondary | ICD-10-CM

## 2012-04-19 ENCOUNTER — Ambulatory Visit (INDEPENDENT_AMBULATORY_CARE_PROVIDER_SITE_OTHER): Payer: Medicare Other | Admitting: Family Medicine

## 2012-04-19 ENCOUNTER — Encounter: Payer: Self-pay | Admitting: Family Medicine

## 2012-04-19 VITALS — BP 140/62 | HR 62 | Temp 98.2°F | Ht 68.5 in | Wt 186.0 lb

## 2012-04-19 DIAGNOSIS — I10 Essential (primary) hypertension: Secondary | ICD-10-CM

## 2012-04-19 DIAGNOSIS — E119 Type 2 diabetes mellitus without complications: Secondary | ICD-10-CM

## 2012-04-19 DIAGNOSIS — D649 Anemia, unspecified: Secondary | ICD-10-CM

## 2012-04-19 LAB — CBC WITH DIFFERENTIAL/PLATELET
Basophils Relative: 0.3 % (ref 0.0–3.0)
Eosinophils Relative: 0.2 % (ref 0.0–5.0)
HCT: 36.3 % — ABNORMAL LOW (ref 39.0–52.0)
Lymphs Abs: 1.9 10*3/uL (ref 0.7–4.0)
MCV: 98.6 fl (ref 78.0–100.0)
Monocytes Relative: 9 % (ref 3.0–12.0)
Platelets: 267 10*3/uL (ref 150.0–400.0)
RBC: 3.68 Mil/uL — ABNORMAL LOW (ref 4.22–5.81)
WBC: 6.7 10*3/uL (ref 4.5–10.5)

## 2012-04-19 LAB — COMPREHENSIVE METABOLIC PANEL
Albumin: 3.8 g/dL (ref 3.5–5.2)
BUN: 29 mg/dL — ABNORMAL HIGH (ref 6–23)
CO2: 26 mEq/L (ref 19–32)
GFR: 52.84 mL/min — ABNORMAL LOW (ref >60.00)
Glucose, Bld: 171 mg/dL — ABNORMAL HIGH (ref 70–99)
Potassium: 5.1 mEq/L (ref 3.5–5.1)
Sodium: 139 mEq/L (ref 135–145)
Total Bilirubin: 0.6 mg/dL (ref 0.3–1.2)
Total Protein: 7 g/dL (ref 6.0–8.3)

## 2012-04-19 LAB — MICROALBUMIN / CREATININE URINE RATIO: Microalb, Ur: 7.6 mg/dL — ABNORMAL HIGH (ref 0.0–1.9)

## 2012-04-19 LAB — HEMOGLOBIN A1C: Hgb A1c MFr Bld: 8.3 % — ABNORMAL HIGH (ref 4.6–6.5)

## 2012-04-19 MED ORDER — METFORMIN HCL 1000 MG PO TABS: 1000.0000 mg | ORAL_TABLET | Freq: Two times a day (BID) | ORAL | Status: DC

## 2012-04-19 NOTE — Assessment & Plan Note (Signed)
bp in fair control at this time  No changes needed  Disc lifstyle change with low sodium diet and exercise  Lab today 

## 2012-04-19 NOTE — Assessment & Plan Note (Signed)
Lab today  ? Post surgical  Pt did not tol iron - took 1 mo

## 2012-04-19 NOTE — Progress Notes (Signed)
Subjective:    Patient ID: Curtis Frazier, male    DOB: 01/12/1941, 71 y.o.   MRN: [id number]  HPI Here for f/u of chronic conditions  Still has ear fullness on and off   Diabetes-on prednisone 8 mg per day- will drop 1 mg a month-- still feels much better PMR  Home sugar results - still running around 180-200 every am  DM diet is watching his sugar the best he can  Exercise - some walking , is ready to start going to the gym , hard with his schedule , and has stationary bike  Symptoms-none at all  A1C last  Lab Results  Component Value Date   HGBA1C 9.0* 01/19/2012  due for re check today  No problems with medications -metformin 500  Renal protection-none currently  Last eye exam -nl in aug, no changes     bp is stable today  No cp or palpitations or headaches or edema  No side effects to medicines  BP Readings from Last 3 Encounters:  04/19/12 140/62  03/19/12 122/68  01/19/12 110/58      Wt is up 2 lb with bmi of 27  Anemia Lab Results  Component Value Date   WBC 7.8 01/19/2012   HGB 11.1* 01/19/2012   HCT 34.2* 01/19/2012   MCV 95.5 01/19/2012   PLT 222.0 01/19/2012   given iron- but only took it 1 month due to GI intol Is eating a lot of beets and greens   Patient Active Problem List  Diagnosis  . DIABETES MELLITUS, TYPE II  . HYPERLIPIDEMIA  . HYPERKALEMIA  . ANEMIA-UNSPECIFIED  . ESSENTIAL HYPERTENSION, BENIGN  . C A D  . GERD  . ANGIODYSPLASIA-INTESTINE  . PSORIATIC ARTHRITIS  . PSORIASIS  . ACTINIC KERATOSIS  . ARTHRITIS, RHEUMATOID  . DEGENERATIVE DISC DISEASE  . SCOLIOSIS  . MURMUR  . CAROTID BRUIT  . HX, PERSONAL, COLONIC POLYPS  . TOBACCO ABUSE, HX OF  . New onset a-fib  . Aortic stenosis  . Chest wall pain  . Back pain  . Acute bacterial sinusitis  . Cough  . Thrush  . Hoarseness  . PMR (polymyalgia rheumatica)  . Low TSH level  . Left ear pain   Past Medical History  Diagnosis Date  . Other and unspecified hyperlipidemia   . Coronary  atherosclerosis of unspecified type of vessel, native or graft   . Personal history of colonic polyps   . Actinic keratosis   . Psoriatic arthropathy   . Other psoriasis   . Scoliosis (and kyphoscoliosis), idiopathic   . GI bleed 12/10    cecam AVM  . Gastritis 12/10  . Hyperlipidemia     takes Pravastatin daily  . Hypertension     takes Metoprolol daily  . Myocardial infarction 2010  . Shortness of breath     with exertion  . Pneumonia     hx of-2008  . Headache     occasionally  . Joint pain   . Joint swelling   . Rheumatoid arthritis     takes Methotrexate 7pills weekly  . Degeneration of intervertebral disc, site unspecified   . Chronic back pain     scoliosis/stenosis/radiculopathy,degenerative disc disease  . Psoriasis   . Bruises easily   . Esophageal reflux     takes Omeprazole daily  . History of colonic polyps   . Hemorrhoids   . Kidney stone     hx of  . Blood transfusion 2010  .  Type II or unspecified type diabetes mellitus without mention of complication, not stated as uncontrolled     type II;controlled by diet and exercise  . PMR (polymyalgia rheumatica) 01/20/2012    tx by specialist on a long prednisone taper   Past Surgical History  Procedure Date  . Rotator cuff repair 2000    left   . Colonoscopy   . Vasectomy 1979  . Ptca stent 02/2009    has one stent  . Carotid doppler 10/12    0-39% R and 60-79% left   . Cataract surgery 2012  . Cardiac catheterization 2010  . Lithotripsy 2009   History  Substance Use Topics  . Smoking status: Former Games developer  . Smokeless tobacco: Not on file     Comment: quit 2010  . Alcohol Use: No     Comment: occasional   Family History  Problem Relation Age of Onset  . Coronary artery disease Mother   . Diabetes Mother   . Heart disease Mother     CAD  . Coronary artery disease Sister   . Hypertension Sister   . Kidney failure Sister   . Heart disease Sister     CAD  . Pancreatic cancer Brother   .  Diabetes Brother   . Prostate cancer Brother   . Cancer Brother     pancreatic CA  . Diabetes Brother   . Anesthesia problems Neg Hx   . Hypotension Neg Hx   . Malignant hyperthermia Neg Hx   . Pseudochol deficiency Neg Hx    Allergies  Allergen Reactions  . Penicillins Swelling and Rash  . Tetracycline Swelling and Rash   Current Outpatient Prescriptions on File Prior to Visit  Medication Sig Dispense Refill  . aspirin EC 81 MG tablet Take 81 mg by mouth daily.      . Cholecalciferol 1000 UNITS capsule Take 1,000 Units by mouth daily.        Marland Kitchen CLOBETASOL PROPIONATE E 0.05 % emollient cream Apply 1 application topically 2 (two) times daily as needed. For skin rash.      . clopidogrel (PLAVIX) 75 MG tablet TAKE 1 TABLET (75 MG TOTAL) BY MOUTH DAILY.  30 tablet  11  . fish oil-omega-3 fatty acids 1000 MG capsule Take 1 g by mouth daily.       . fluticasone (FLONASE) 50 MCG/ACT nasal spray Place 2 sprays into the nose daily.  16 g  6  . FOLIC ACID PO Take 1 tablet by mouth daily.       Marland Kitchen glucose blood test strip Use as instructed  100 each  12  . HYDROcodone-acetaminophen (VICODIN) 5-500 MG per tablet Twice daily as needed.      . metFORMIN (GLUCOPHAGE) 500 MG tablet Take 1 tablet (500 mg total) by mouth 2 (two) times daily with a meal. For diabetes/ sugar control  60 tablet  11  . methotrexate (RHEUMATREX) 2.5 MG tablet Take by mouth once a week. 7 TABS WEEKLY ./CY      . metoprolol succinate (TOPROL-XL) 25 MG 24 hr tablet Take 1 tablet (25 mg total) by mouth daily.  90 tablet  3  . multivitamin (THERAGRAN) per tablet Take 1 tablet by mouth daily.        . nitroGLYCERIN (NITROSTAT) 0.4 MG SL tablet Place 0.4 mg under the tongue every 5 (five) minutes as needed. For chest pain.       Marland Kitchen omeprazole (PRILOSEC) 40 MG capsule Take 40 mg by  mouth daily.        . pravastatin (PRAVACHOL) 10 MG tablet Take 1 tablet (10 mg total) by mouth daily.  30 tablet  5  . predniSONE (DELTASONE) 1 MG tablet  Take 1 mg by mouth daily.            Review of Systems Review of Systems  Constitutional: Negative for fever, appetite change, fatigue and unexpected weight change.  Eyes: Negative for pain and visual disturbance.  ENT pos for ear fullness occasionally without pain  Respiratory: Negative for cough and shortness of breath.   Cardiovascular: Negative for cp or palpitations    Gastrointestinal: Negative for nausea, diarrhea and constipation.  Genitourinary: Negative for urgency and frequency.  Skin: Negative for pallor or rash   Neurological: Negative for weakness, light-headedness, numbness and headaches.  Hematological: Negative for adenopathy. Does not bruise/bleed easily.  Psychiatric/Behavioral: Negative for dysphoric mood. The patient is not nervous/anxious.         Objective:   Physical Exam  Constitutional: He appears well-developed and well-nourished. No distress.  HENT:  Head: Normocephalic and atraumatic.  Mouth/Throat: Oropharynx is clear and moist.  Eyes: Conjunctivae normal and EOM are normal. Pupils are equal, round, and reactive to light. Right eye exhibits no discharge. Left eye exhibits no discharge.  Neck: Normal range of motion. Neck supple. Carotid bruit is not present. No thyromegaly present.  Cardiovascular: Normal rate, regular rhythm, normal heart sounds and intact distal pulses.  Exam reveals no gallop.   Pulmonary/Chest: Effort normal and breath sounds normal. No respiratory distress. He has no wheezes.  Abdominal: Soft. Bowel sounds are normal. He exhibits no distension, no abdominal bruit and no mass. There is no tenderness.  Musculoskeletal: He exhibits no edema and no tenderness.  Lymphadenopathy:    He has no cervical adenopathy.  Neurological: He is alert. He has normal reflexes. No cranial nerve deficit. He exhibits normal muscle tone. Coordination normal.  Skin: Skin is warm and dry. No rash noted. No erythema. No pallor.  Psychiatric: He has a  normal mood and affect.          Assessment & Plan:

## 2012-04-19 NOTE — Patient Instructions (Signed)
Labs today  Increase your metformin to 1000 mg twice daily  Watch diet  Look for a book called "sugar busters" Follow up in 6 months for annual exam with labs prior

## 2012-04-19 NOTE — Assessment & Plan Note (Signed)
a1c today Per home sugars being high- will inc metformin to 1000 bid microalb today as well  opthy utd  No symptoms Is prednisone induced and he continues to taper it for pmr

## 2012-04-30 ENCOUNTER — Ambulatory Visit (INDEPENDENT_AMBULATORY_CARE_PROVIDER_SITE_OTHER): Payer: Medicare Other | Admitting: Cardiovascular Disease

## 2012-04-30 VITALS — BP 135/75 | HR 65 | Ht 68.0 in | Wt 183.1 lb

## 2012-04-30 DIAGNOSIS — E785 Hyperlipidemia, unspecified: Secondary | ICD-10-CM

## 2012-04-30 DIAGNOSIS — I10 Essential (primary) hypertension: Secondary | ICD-10-CM

## 2012-04-30 DIAGNOSIS — I4891 Unspecified atrial fibrillation: Secondary | ICD-10-CM

## 2012-04-30 DIAGNOSIS — R0989 Other specified symptoms and signs involving the circulatory and respiratory systems: Secondary | ICD-10-CM

## 2012-04-30 DIAGNOSIS — I2510 Atherosclerotic heart disease of native coronary artery without angina pectoris: Secondary | ICD-10-CM

## 2012-04-30 NOTE — Assessment & Plan Note (Signed)
Stable with no angina and good activity level.  Continue medical Rx  

## 2012-04-30 NOTE — Assessment & Plan Note (Signed)
Maint NSR no palpitations continue beta blocker  

## 2012-04-30 NOTE — Assessment & Plan Note (Signed)
Well controlled.  Continue current medications and low sodium Dash type diet.    

## 2012-04-30 NOTE — Progress Notes (Signed)
Patient ID: Curtis Frazier, male   DOB: 01/19/1941, 71 y.o.   MRN: [id number] Curtis Frazier is seen today For F/U of CAD, carotid disease. He has a BMS in his circ from [facility] in 02/2009. GI bleed last year from AVM. He had a normal myovue in January 2011 with no ischemia and normal LV  1/13 had lumbar surgery with PAF in setting of post op anemia and pain.  Converted with amiodarone and continues on only beta blocker now  On prednisone taper for PMR  This has improved his hip and knee pain.  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  All other systems reviewed and negative  General: Affect appropriate Healthy:  appears stated age HEENT: normal Neck supple with no adenopathy JVP normal bilateral  bruits no thyromegaly Lungs clear with no wheezing and good diaphragmatic motion Heart:  S1/S2 SEM murmur, no rub, gallop or click PMI normal Abdomen: benighn, BS positve, no tenderness, no AAA no bruit.  No HSM or HJR Distal pulses intact with no bruits No edema Neuro non-focal Skin warm and dry No muscular weakness   Current Outpatient Prescriptions  Medication Sig Dispense Refill  . aspirin EC 81 MG tablet Take 81 mg by mouth daily.      . Cholecalciferol 1000 UNITS capsule Take 1,000 Units by mouth daily.        Marland Kitchen CLOBETASOL PROPIONATE E 0.05 % emollient cream Apply 1 application topically 2 (two) times daily as needed. For skin rash.      . clopidogrel (PLAVIX) 75 MG tablet TAKE 1 TABLET (75 MG TOTAL) BY MOUTH DAILY.  30 tablet  11  . fish oil-omega-3 fatty acids 1000 MG capsule Take 1 g by mouth daily.       . fluticasone (FLONASE) 50 MCG/ACT nasal spray Place 2 sprays into the nose daily.  16 g  6  . FOLIC ACID PO Take 1 tablet by mouth daily.        Marland Kitchen glucose blood test strip Use as instructed  100 each  12  . HYDROcodone-acetaminophen (VICODIN) 5-500 MG per tablet Twice daily as needed.      . metFORMIN (GLUCOPHAGE) 1000 MG tablet Take 1 tablet (1,000 mg total) by mouth 2 (two) times daily with a meal.  180 tablet  3  . methotrexate (RHEUMATREX) 2.5 MG tablet Take by mouth once a week. 7 TABS WEEKLY ./CY      . metoprolol succinate (TOPROL-XL) 25 MG 24 hr tablet Take 1 tablet (25 mg total) by mouth daily.  90 tablet  3  . multivitamin (THERAGRAN) per tablet Take 1 tablet by mouth daily.        . nitroGLYCERIN (NITROSTAT) 0.4 MG SL tablet Place 0.4 mg under the tongue every 5 (five) minutes as needed. For chest pain.       Marland Kitchen omeprazole (PRILOSEC) 40 MG capsule Take 40 mg by mouth daily.        . pravastatin (PRAVACHOL) 10 MG tablet Take 1 tablet (10 mg total) by mouth daily.  30 tablet  5  . predniSONE (DELTASONE) 1 MG tablet Take 1 mg by mouth daily.        Allergies  Penicillins and Tetracycline  Electrocardiogram:  06/17/11  NSR rate 85 normal ECG  Assessment  and Plan

## 2012-04-30 NOTE — Assessment & Plan Note (Signed)
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

## 2012-04-30 NOTE — Patient Instructions (Signed)
Your physician wants you to follow-up in:  6 MONTHS WITH DR  Eden Emms  CAROTID SAME DAY  You will receive a reminder letter in the mail two months in advance. If you don't receive a letter, please call our office to schedule the follow-up appointment. Your physician recommends that you continue on your current medications as directed. Please refer to the Current Medication list given to you today.  Your physician has requested that you have a carotid duplex. This test is an ultrasound of the carotid arteries in your neck. It looks at blood flow through these arteries that supply the brain with blood. Allow one hour for this exam. There are no restrictions or special instructions. April  SEE DR Eden Emms SAME DAY

## 2012-04-30 NOTE — Assessment & Plan Note (Signed)
Cholesterol is at goal.  Continue current dose of statin and diet Rx.  No myalgias or side effects.  F/U  LFT's in 6 months. Lab Results  Component Value Date   LDLCALC 131* 11/18/2010             

## 2012-05-01 ENCOUNTER — Ambulatory Visit: Payer: Medicare Other | Admitting: Cardiovascular Disease

## 2012-05-16 ENCOUNTER — Ambulatory Visit (INDEPENDENT_AMBULATORY_CARE_PROVIDER_SITE_OTHER): Payer: Medicare Other | Admitting: Family Medicine

## 2012-05-16 DIAGNOSIS — Z23 Encounter for immunization: Secondary | ICD-10-CM

## 2012-05-17 ENCOUNTER — Ambulatory Visit: Payer: Medicare Other

## 2012-06-11 ENCOUNTER — Telehealth: Payer: Self-pay | Admitting: Cardiovascular Disease

## 2012-06-11 MED ORDER — NITROGLYCERIN 0.4 MG SL SUBL: 0.4000 mg | SUBLINGUAL_TABLET | SUBLINGUAL | Status: DC | PRN

## 2012-06-11 NOTE — Telephone Encounter (Signed)
Pt needs refill of nitro called into [facility] [address], pt's wife washed his bottle which were left in his pants pocket so he has none at all

## 2012-06-27 ENCOUNTER — Telehealth: Payer: Self-pay | Admitting: Family Medicine

## 2012-06-27 DIAGNOSIS — E785 Hyperlipidemia, unspecified: Secondary | ICD-10-CM

## 2012-06-27 DIAGNOSIS — E119 Type 2 diabetes mellitus without complications: Secondary | ICD-10-CM

## 2012-06-27 DIAGNOSIS — E875 Hyperkalemia: Secondary | ICD-10-CM

## 2012-06-27 DIAGNOSIS — I10 Essential (primary) hypertension: Secondary | ICD-10-CM

## 2012-06-27 DIAGNOSIS — Z125 Encounter for screening for malignant neoplasm of prostate: Secondary | ICD-10-CM

## 2012-06-27 DIAGNOSIS — D649 Anemia, unspecified: Secondary | ICD-10-CM

## 2012-06-27 NOTE — Telephone Encounter (Signed)
Message copied by Judy Pimple on Thu Jun 27, 2012  4:07 PM ------      Message from: Alvina Chou      Created: Thu Jun 27, 2012 11:52 AM      Regarding: lab orders for Friday, 1.17.14       Patient is scheduled for CPX labs, please order future labs, Thanks , Camelia Eng

## 2012-06-28 ENCOUNTER — Other Ambulatory Visit (INDEPENDENT_AMBULATORY_CARE_PROVIDER_SITE_OTHER): Payer: Medicare Other

## 2012-06-28 DIAGNOSIS — E119 Type 2 diabetes mellitus without complications: Secondary | ICD-10-CM

## 2012-06-28 DIAGNOSIS — E785 Hyperlipidemia, unspecified: Secondary | ICD-10-CM

## 2012-06-28 DIAGNOSIS — D649 Anemia, unspecified: Secondary | ICD-10-CM

## 2012-06-28 DIAGNOSIS — E875 Hyperkalemia: Secondary | ICD-10-CM

## 2012-06-28 DIAGNOSIS — I10 Essential (primary) hypertension: Secondary | ICD-10-CM

## 2012-06-28 LAB — LIPID PANEL
Cholesterol: 219 mg/dL — ABNORMAL HIGH (ref 0–200)
HDL: 37.7 mg/dL — ABNORMAL LOW (ref >39.00)
Triglycerides: 149 mg/dL (ref 0.0–149.0)

## 2012-06-28 LAB — CBC WITH DIFFERENTIAL/PLATELET
Basophils Absolute: 0 10*3/uL (ref 0.0–0.1)
Basophils Relative: 0.4 % (ref 0.0–3.0)
Eosinophils Absolute: 0 10*3/uL (ref 0.0–0.7)
HCT: 36.1 % — ABNORMAL LOW (ref 39.0–52.0)
Hemoglobin: 12 g/dL — ABNORMAL LOW (ref 13.0–17.0)
Lymphs Abs: 2.4 10*3/uL (ref 0.7–4.0)
MCHC: 33.2 g/dL (ref 30.0–36.0)
Monocytes Relative: 8.3 % (ref 3.0–12.0)
Neutro Abs: 4.1 10*3/uL (ref 1.4–7.7)
RBC: 3.64 Mil/uL — ABNORMAL LOW (ref 4.22–5.81)
RDW: 16 % — ABNORMAL HIGH (ref 11.5–14.6)

## 2012-06-28 LAB — COMPREHENSIVE METABOLIC PANEL
ALT: 28 U/L (ref 0–53)
BUN: 23 mg/dL (ref 6–23)
CO2: 26 mEq/L (ref 19–32)
Calcium: 8.9 mg/dL (ref 8.4–10.5)
Creatinine, Ser: 1.4 mg/dL (ref 0.4–1.5)
GFR: 54.6 mL/min — ABNORMAL LOW (ref >60.00)
Total Bilirubin: 0.9 mg/dL (ref 0.3–1.2)

## 2012-06-28 LAB — MICROALBUMIN / CREATININE URINE RATIO: Microalb Creat Ratio: 5.2 mg/g (ref 0.0–30.0)

## 2012-06-28 LAB — HEMOGLOBIN A1C: Hgb A1c MFr Bld: 7.8 % — ABNORMAL HIGH (ref 4.6–6.5)

## 2012-06-28 LAB — LDL CHOLESTEROL, DIRECT: Direct LDL: 131.3 mg/dL

## 2012-06-28 LAB — TSH: TSH: 0.44 u[IU]/mL (ref 0.35–5.50)

## 2012-07-03 ENCOUNTER — Other Ambulatory Visit: Payer: Self-pay | Admitting: Family Medicine

## 2012-07-05 ENCOUNTER — Encounter: Payer: Self-pay | Admitting: Family Medicine

## 2012-07-05 ENCOUNTER — Ambulatory Visit (INDEPENDENT_AMBULATORY_CARE_PROVIDER_SITE_OTHER): Payer: Medicare Other | Admitting: Family Medicine

## 2012-07-05 VITALS — BP 114/72 | HR 56 | Temp 98.3°F | Ht 68.25 in | Wt 180.8 lb

## 2012-07-05 DIAGNOSIS — N400 Enlarged prostate without lower urinary tract symptoms: Secondary | ICD-10-CM | POA: Insufficient documentation

## 2012-07-05 DIAGNOSIS — I10 Essential (primary) hypertension: Secondary | ICD-10-CM

## 2012-07-05 DIAGNOSIS — N401 Enlarged prostate with lower urinary tract symptoms: Secondary | ICD-10-CM

## 2012-07-05 DIAGNOSIS — Z0000 Encounter for general adult medical examination without abnormal findings: Secondary | ICD-10-CM

## 2012-07-05 DIAGNOSIS — E785 Hyperlipidemia, unspecified: Secondary | ICD-10-CM

## 2012-07-05 DIAGNOSIS — E119 Type 2 diabetes mellitus without complications: Secondary | ICD-10-CM

## 2012-07-05 DIAGNOSIS — Z125 Encounter for screening for malignant neoplasm of prostate: Secondary | ICD-10-CM

## 2012-07-05 DIAGNOSIS — N139 Obstructive and reflux uropathy, unspecified: Secondary | ICD-10-CM

## 2012-07-05 DIAGNOSIS — N138 Other obstructive and reflux uropathy: Secondary | ICD-10-CM

## 2012-07-05 LAB — POCT URINALYSIS DIPSTICK
Bilirubin, UA: NEGATIVE
Glucose, UA: NEGATIVE
Leukocytes, UA: NEGATIVE
Nitrite, UA: NEGATIVE
Urobilinogen, UA: 0.2

## 2012-07-05 NOTE — Patient Instructions (Addendum)
Sugar control is gradually improving - hope for further improvement as the prednisone dose goes down  Try to follow a lot sugar diet the best you can and stay active  Schedule follow up in 3 months with labs prior  No restrictions for driving

## 2012-07-05 NOTE — Progress Notes (Signed)
Subjective:    Patient ID: Curtis Frazier, male    DOB: 01-Dec-1940, 72 y.o.   MRN: [id number]  HPI Here for check up of chronic medical conditions and to review health mt list  -also DOT physical  Is doing pretty well overall   Wt is down 3 lb Is eating enough - tries not to eat too much  Still has some back pain but he is dealing with it ok   zostavax- cannot get yet due to prednisone  Slowly coming down on prednisone dose - down to 5 mg daily- and still working for him   opthy- last one was in aug  Flu shot - had that in  Early nov at [facility]   Prostate occ trouble emptying bladder  Gets up one time per night  He is not worried about prostate cancer screening  No family history  Lab Results  Component Value Date   PSA 0.63 04/19/2007   PSA .61 11/27/2005    bp is stable today  No cp or palpitations or headaches or edema  No side effects to medicines  BP Readings from Last 3 Encounters:  07/05/12 114/72  04/30/12 135/75  04/19/12 140/62      DM Lab Results  Component Value Date   HGBA1C 7.8* 06/28/2012   this is down from 8.3 On metformin 1000 mg bid now  microalb ratio 5.1- down from last time Cr is 1.4 On prednisone for PMR- and expect more imp as that dose decreases  Follows DM diet   K 5.2 (higher on ace in the past) We keep an eye on this   Anemia continues to improve Lab Results  Component Value Date   WBC 7.1 06/28/2012   HGB 12.0* 06/28/2012   HCT 36.1* 06/28/2012   MCV 99.3 06/28/2012   PLT 256.0 06/28/2012    Falls --none at all   Mood- is good overall  Cardiac update - overall doing well, no problems or symptoms or changes    Patient Active Problem List  Diagnosis  . DIABETES MELLITUS, TYPE II  . HYPERLIPIDEMIA  . HYPERKALEMIA  . ANEMIA-UNSPECIFIED  . ESSENTIAL HYPERTENSION, BENIGN  . C A D  . GERD  . ANGIODYSPLASIA-INTESTINE  . PSORIATIC ARTHRITIS  . PSORIASIS  . ACTINIC KERATOSIS  . ARTHRITIS, RHEUMATOID  . DEGENERATIVE DISC DISEASE  .  SCOLIOSIS  . MURMUR  . CAROTID BRUIT  . HX, PERSONAL, COLONIC POLYPS  . TOBACCO ABUSE, HX OF  . New onset a-fib  . Aortic stenosis  . Hoarseness  . PMR (polymyalgia rheumatica)  . Low TSH level  . Prostate cancer screening   Past Medical History  Diagnosis Date  . Other and unspecified hyperlipidemia   . Coronary atherosclerosis of unspecified type of vessel, native or graft   . Personal history of colonic polyps   . Actinic keratosis   . Psoriatic arthropathy   . Other psoriasis   . Scoliosis (and kyphoscoliosis), idiopathic   . GI bleed 12/10    cecam AVM  . Gastritis 12/10  . Hyperlipidemia     takes Pravastatin daily  . Hypertension     takes Metoprolol daily  . Myocardial infarction 2010  . Shortness of breath     with exertion  . Pneumonia     hx of-2008  . Headache     occasionally  . Joint pain   . Joint swelling   . Rheumatoid arthritis     takes Methotrexate 7pills weekly  .  Degeneration of intervertebral disc, site unspecified   . Chronic back pain     scoliosis/stenosis/radiculopathy,degenerative disc disease  . Psoriasis   . Bruises easily   . Esophageal reflux     takes Omeprazole daily  . History of colonic polyps   . Hemorrhoids   . Kidney stone     hx of  . Blood transfusion 2010  . Type II or unspecified type diabetes mellitus without mention of complication, not stated as uncontrolled     type II;controlled by diet and exercise  . PMR (polymyalgia rheumatica) 01/20/2012    tx by specialist on a long prednisone taper   Past Surgical History  Procedure Date  . Rotator cuff repair 2000    left   . Colonoscopy   . Vasectomy 1979  . Ptca stent 02/2009    has one stent  . Carotid doppler 10/12    0-39% R and 60-79% left   . Cataract surgery 2012  . Cardiac catheterization 2010  . Lithotripsy 2009   History  Substance Use Topics  . Smoking status: Former Games developer  . Smokeless tobacco: Not on file     Comment: quit 2010  . Alcohol  Use: Yes     Comment: occasional   Family History  Problem Relation Age of Onset  . Coronary artery disease Mother   . Diabetes Mother   . Heart disease Mother     CAD  . Coronary artery disease Sister   . Hypertension Sister   . Kidney failure Sister   . Heart disease Sister     CAD  . Pancreatic cancer Brother   . Diabetes Brother   . Prostate cancer Brother   . Cancer Brother     pancreatic CA  . Diabetes Brother   . Anesthesia problems Neg Hx   . Hypotension Neg Hx   . Malignant hyperthermia Neg Hx   . Pseudochol deficiency Neg Hx    Allergies  Allergen Reactions  . Penicillins Swelling and Rash  . Tetracycline Swelling and Rash   Current Outpatient Prescriptions on File Prior to Visit  Medication Sig Dispense Refill  . aspirin EC 81 MG tablet Take 81 mg by mouth daily.      . Cholecalciferol 1000 UNITS capsule Take 1,000 Units by mouth daily.        Marland Kitchen CLOBETASOL PROPIONATE E 0.05 % emollient cream Apply 1 application topically 2 (two) times daily as needed. For skin rash.      . clopidogrel (PLAVIX) 75 MG tablet TAKE 1 TABLET (75 MG TOTAL) BY MOUTH DAILY.  30 tablet  11  . fish oil-omega-3 fatty acids 1000 MG capsule Take 1 g by mouth daily.       . fluticasone (FLONASE) 50 MCG/ACT nasal spray Place 2 sprays into the nose daily.  16 g  6  . FOLIC ACID PO Take 1 tablet by mouth daily.       Marland Kitchen glucose blood test strip Use as instructed  100 each  12  . HYDROcodone-acetaminophen (VICODIN) 5-500 MG per tablet Twice daily as needed.      . metFORMIN (GLUCOPHAGE) 1000 MG tablet Take 1 tablet (1,000 mg total) by mouth 2 (two) times daily with a meal.  180 tablet  3  . methotrexate (RHEUMATREX) 2.5 MG tablet Take by mouth once a week. 7 TABS WEEKLY ./CY      . metoprolol succinate (TOPROL-XL) 25 MG 24 hr tablet Take 1 tablet (25 mg total) by mouth  daily.  90 tablet  3  . multivitamin (THERAGRAN) per tablet Take 1 tablet by mouth daily.        . nitroGLYCERIN (NITROSTAT) 0.4  MG SL tablet Place 1 tablet (0.4 mg total) under the tongue every 5 (five) minutes as needed. For chest pain.  25 tablet  12  . omeprazole (PRILOSEC) 40 MG capsule Take 40 mg by mouth daily.        . pravastatin (PRAVACHOL) 10 MG tablet TAKE 1 TABLET BY MOUTH EVERY DAY  30 tablet  5  . predniSONE (DELTASONE) 1 MG tablet Take 7 mg by mouth daily.         Review of Systems Review of Systems  Constitutional: Negative for fever, appetite change, fatigue and unexpected weight change.  Eyes: Negative for pain and visual disturbance.  Respiratory: Negative for cough and shortness of breath.   Cardiovascular: Negative for cp or palpitations    Gastrointestinal: Negative for nausea, diarrhea and constipation.  Genitourinary: Negative for urgency and frequency.  Skin: Negative for pallor or rash   MSK pos for low back pain that is chronic  Neurological: Negative for weakness, light-headedness, numbness and headaches.  Hematological: Negative for adenopathy. Does not bruise/bleed easily.  Psychiatric/Behavioral: Negative for dysphoric mood. The patient is not nervous/anxious.         Objective:   Physical Exam  Constitutional: He appears well-developed and well-nourished. No distress.  HENT:  Head: Normocephalic and atraumatic.  Right Ear: External ear normal.  Left Ear: External ear normal.  Nose: Nose normal.  Mouth/Throat: No oropharyngeal exudate.  Eyes: Conjunctivae normal and EOM are normal. Pupils are equal, round, and reactive to light. Right eye exhibits no discharge. Left eye exhibits no discharge.  Neck: Normal range of motion. Neck supple. No JVD present. Carotid bruit is not present. No thyromegaly present.  Cardiovascular: Normal rate and regular rhythm.   Pulmonary/Chest: Effort normal and breath sounds normal. No respiratory distress. He has no wheezes.  Abdominal: Soft. Bowel sounds are normal. He exhibits no distension, no abdominal bruit and no mass. There is no tenderness.    Genitourinary: Rectum normal. Prostate is enlarged. Prostate is not tender.  Musculoskeletal: He exhibits no edema and no tenderness.  Lymphadenopathy:    He has no cervical adenopathy.  Neurological: He is alert. He has normal reflexes. No cranial nerve deficit. He exhibits normal muscle tone. Coordination normal.  Skin: Skin is warm and dry. No rash noted. No erythema. No pallor.  Psychiatric: He has a normal mood and affect.          Assessment & Plan:

## 2012-07-07 NOTE — Assessment & Plan Note (Signed)
In light of BPH symptoms will check psa before 3 mo f/u

## 2012-07-07 NOTE — Assessment & Plan Note (Signed)
Disc goals for lipids and reasons to control them Rev labs with pt Rev low sat fat diet in detail   

## 2012-07-07 NOTE — Assessment & Plan Note (Signed)
bp in fair control at this time  No changes needed  Disc lifstyle change with low sodium diet and exercise  Labs reviewed  

## 2012-07-07 NOTE — Assessment & Plan Note (Signed)
Pt has some nocturia - worse with age - DRE is unremarkable overall  Pt does not desire tx at this time  Will check a1c in 3 mo and f/u

## 2012-07-07 NOTE — Assessment & Plan Note (Signed)
DM is improved, not at goal - but expect continued imp with less prednisone (dose to be reduced soon) Rev labs Rev low glycemic diet  F/u 3 mo with a1c prior

## 2012-07-30 ENCOUNTER — Other Ambulatory Visit: Payer: Self-pay | Admitting: Family Medicine

## 2012-07-30 MED ORDER — GLUCOSE BLOOD VI STRP: ORAL_STRIP | Status: DC

## 2012-08-02 ENCOUNTER — Other Ambulatory Visit: Payer: Self-pay | Admitting: Family Medicine

## 2012-08-02 MED ORDER — GLUCOSE BLOOD VI STRP: ORAL_STRIP | Status: DC

## 2012-08-02 NOTE — Telephone Encounter (Signed)
Received fax from pharmacy that since pt has medicare they need specific directions for test strips along with quantity and the Diagnosis code, please advise, pharmacy said we can just send in new Rx electronically with information

## 2012-08-02 NOTE — Telephone Encounter (Signed)
Px sent electronically  

## 2012-08-06 ENCOUNTER — Other Ambulatory Visit: Payer: Self-pay | Admitting: Family Medicine

## 2012-08-06 MED ORDER — GLUCOSE BLOOD VI STRP: ORAL_STRIP | Status: DC

## 2012-08-06 NOTE — Telephone Encounter (Signed)
Hand written and in IN box

## 2012-08-06 NOTE — Telephone Encounter (Signed)
Received fax from [facility], [employer] That you have to hand write this Rx they can't accept e-scribe, an on the Rx please include diagnosis code, sig, quantity and Strips Name (it says pt uses Contour test strips)

## 2012-08-07 NOTE — Telephone Encounter (Signed)
Rx faxed to pharmacy  

## 2012-08-08 ENCOUNTER — Telehealth: Payer: Self-pay | Admitting: Family Medicine

## 2012-08-08 NOTE — Telephone Encounter (Signed)
Patient Information:  Caller Name: [place]  Phone: [phone]  Patient: Curtis Frazier, Curtis Frazier  Gender: Male  DOB: 19-Sep-1940  Age: 72 Years  PCP: [place] [employer])  Office Follow Up:  Does the office need to follow up with this patient?: No  Instructions For The Office: N/A  RN Note:  Has chest congestion but unable to cough it up and voice is hoarse but not really sore.  States occasionally feels a little raw from coughing. No fever.  No shortness of breath.  Symptoms  Reason For Call & Symptoms: hoarseness and chest congestion  Reviewed Health History In EMR: Yes  Reviewed Medications In EMR: Yes  Reviewed Allergies In EMR: Yes  Reviewed Surgeries / Procedures: Yes  Date of Onset of Symptoms: 08/06/2012  Guideline(s) Used:  Colds  Disposition Per Guideline:   Home Care  Reason For Disposition Reached:   Colds with no complications  Advice Given:  Reassurance  It sounds like an uncomplicated cold that we can treat at home.  Humidifier:  If the air in your home is dry, use a cool-mist humidifier  Call Back If:  Difficulty breathing occurs  You become worse

## 2012-08-19 ENCOUNTER — Other Ambulatory Visit: Payer: Self-pay | Admitting: Family Medicine

## 2012-09-25 ENCOUNTER — Other Ambulatory Visit: Payer: Self-pay | Admitting: Family Medicine

## 2012-09-26 ENCOUNTER — Other Ambulatory Visit (INDEPENDENT_AMBULATORY_CARE_PROVIDER_SITE_OTHER): Payer: Medicare Other

## 2012-09-26 DIAGNOSIS — N400 Enlarged prostate without lower urinary tract symptoms: Secondary | ICD-10-CM

## 2012-09-26 DIAGNOSIS — N139 Obstructive and reflux uropathy, unspecified: Secondary | ICD-10-CM

## 2012-09-26 DIAGNOSIS — N401 Enlarged prostate with lower urinary tract symptoms: Secondary | ICD-10-CM

## 2012-09-26 DIAGNOSIS — E119 Type 2 diabetes mellitus without complications: Secondary | ICD-10-CM

## 2012-09-26 DIAGNOSIS — N138 Other obstructive and reflux uropathy: Secondary | ICD-10-CM

## 2012-09-26 LAB — PSA: PSA: 0.74 ng/mL (ref 0.10–4.00)

## 2012-09-27 ENCOUNTER — Encounter (INDEPENDENT_AMBULATORY_CARE_PROVIDER_SITE_OTHER): Payer: Medicare Other

## 2012-09-27 DIAGNOSIS — I6529 Occlusion and stenosis of unspecified carotid artery: Secondary | ICD-10-CM

## 2012-10-01 ENCOUNTER — Ambulatory Visit (INDEPENDENT_AMBULATORY_CARE_PROVIDER_SITE_OTHER): Payer: Medicare Other | Admitting: Cardiovascular Disease

## 2012-10-01 ENCOUNTER — Encounter: Payer: Self-pay | Admitting: Cardiovascular Disease

## 2012-10-01 VITALS — BP 100/64 | HR 62 | Wt 182.0 lb

## 2012-10-01 DIAGNOSIS — R0989 Other specified symptoms and signs involving the circulatory and respiratory systems: Secondary | ICD-10-CM

## 2012-10-01 DIAGNOSIS — I359 Nonrheumatic aortic valve disorder, unspecified: Secondary | ICD-10-CM

## 2012-10-01 DIAGNOSIS — E785 Hyperlipidemia, unspecified: Secondary | ICD-10-CM

## 2012-10-01 DIAGNOSIS — I350 Nonrheumatic aortic (valve) stenosis: Secondary | ICD-10-CM

## 2012-10-01 DIAGNOSIS — I2510 Atherosclerotic heart disease of native coronary artery without angina pectoris: Secondary | ICD-10-CM

## 2012-10-01 DIAGNOSIS — I10 Essential (primary) hypertension: Secondary | ICD-10-CM

## 2012-10-01 NOTE — Assessment & Plan Note (Signed)
60% LICA stable ASA  F/U duplex in October

## 2012-10-01 NOTE — Progress Notes (Signed)
Patient ID: Curtis Frazier, male   DOB: 1941-03-20, 72 y.o.   MRN: [id number] Curtis Frazier is seen today For F/U of CAD, carotid disease. He has a BMS in his circ from [facility] in 02/2009. GI bleed last year from AVM. He had a normal myovue in January 2011 with no ischemia and normal LV 1/13 had lumbar surgery with PAF in setting of post op anemia and pain. Converted with amiodarone and continues on only beta blocker now On prednisone taper for PMR This has improved his hip and knee pain. Last carotids 09/27/12 reviewed and Left ICA 60-79% stenosis.  No TIA symptoms.  Continue antiplatelet Rx and F/U carotid duplex in 6 months    ROS: Denies fever, malais, weight loss, blurry vision, decreased visual acuity, cough, sputum, SOB, hemoptysis, pleuritic pain, palpitaitons, heartburn, abdominal pain, melena, lower extremity edema, claudication, or rash.  All other systems reviewed and negative  General: Affect appropriate Healthy:  appears stated age HEENT: normal Neck supple with no adenopathy JVP normal bilateral bruits no thyromegaly Lungs clear with no wheezing and good diaphragmatic motion Heart:  S1/S2 SEM  murmur, no rub, gallop or click PMI normal Abdomen: benighn, BS positve, no tenderness, no AAA no bruit.  No HSM or HJR Distal pulses intact with no bruits No edema Neuro non-focal Skin warm and dry No muscular weakness   Current Outpatient Prescriptions  Medication Sig Dispense Refill  . aspirin EC 81 MG tablet Take 81 mg by mouth daily.      . Cholecalciferol 1000 UNITS capsule Take 1,000 Units by mouth daily.        Marland Kitchen CLOBETASOL PROPIONATE E 0.05 % emollient cream Apply 1 application topically 2 (two) times daily as needed. For skin rash.      . clopidogrel (PLAVIX) 75 MG tablet TAKE 1 TABLET (75 MG TOTAL) BY MOUTH DAILY.  30 tablet  1  . fish oil-omega-3 fatty acids 1000 MG capsule Take 1 g by mouth daily.       . fluticasone (FLONASE) 50 MCG/ACT nasal spray Place 2 sprays into the nose daily.   16 g  6  . FOLIC ACID PO Take 1 tablet by mouth daily.       Marland Kitchen glucose blood test strip To check blood sugar for poorly controlled diabetes twice daily and as needed  100 each  3  . glucose blood test strip To check blood sugar for poorly controlled diabetes twice daily and as needed Band is contour      . metFORMIN (GLUCOPHAGE) 1000 MG tablet Take 1 tablet (1,000 mg total) by mouth 2 (two) times daily with a meal.  180 tablet  3  . methotrexate (RHEUMATREX) 2.5 MG tablet Take by mouth once a week. 7 TABS WEEKLY ./CY      . metoprolol succinate (TOPROL-XL) 25 MG 24 hr tablet TAKE 1 TABLET (25 MG TOTAL) BY MOUTH DAILY.  90 tablet  0  . multivitamin (THERAGRAN) per tablet Take 1 tablet by mouth daily.        . nitroGLYCERIN (NITROSTAT) 0.4 MG SL tablet Place 1 tablet (0.4 mg total) under the tongue every 5 (five) minutes as needed. For chest pain.  25 tablet  12  . omeprazole (PRILOSEC) 40 MG capsule Take 40 mg by mouth daily.        . pravastatin (PRAVACHOL) 10 MG tablet TAKE 1 TABLET BY MOUTH EVERY DAY  30 tablet  5  . predniSONE (DELTASONE) 1 MG tablet Take 7 mg  by mouth daily.        No current facility-administered medications for this visit.    Allergies  Penicillins and Tetracycline  Electrocardiogram:  Assessment and Plan

## 2012-10-01 NOTE — Assessment & Plan Note (Signed)
Cholesterol is at goal.  Continue current dose of statin and diet Rx.  No myalgias or side effects.  F/U  LFT's in 6 months. Lab Results  Component Value Date   LDLCALC 131* 11/18/2010

## 2012-10-01 NOTE — Assessment & Plan Note (Signed)
Stable with no angina and good activity level.  Continue medical Rx  

## 2012-10-01 NOTE — Assessment & Plan Note (Signed)
Well controlled.  Continue current medications and low sodium Dash type diet.    

## 2012-10-01 NOTE — Patient Instructions (Signed)
Your physician wants you to follow-up in:   6 MONTHS WITH DR Eden Emms AND CAROTID SAME DAY  You will receive a reminder letter in the mail two months in advance. If you don't receive a letter, please call our office to schedule the follow-up appointment. Your physician recommends that you continue on your current medications as directed. Please refer to the Current Medication list given to you today. Your physician has requested that you have a carotid duplex. This test is an ultrasound of the carotid arteries in your neck. It looks at blood flow through these arteries that supply the brain with blood. Allow one hour for this exam. There are no restrictions or special instructions. 6 MONTHS

## 2012-10-01 NOTE — Assessment & Plan Note (Signed)
Very mild consider echo in 2 years

## 2012-10-04 ENCOUNTER — Encounter: Payer: Self-pay | Admitting: Family Medicine

## 2012-10-04 ENCOUNTER — Ambulatory Visit: Payer: Medicare Other | Admitting: Family Medicine

## 2012-10-04 ENCOUNTER — Ambulatory Visit (INDEPENDENT_AMBULATORY_CARE_PROVIDER_SITE_OTHER): Payer: Medicare Other | Admitting: Family Medicine

## 2012-10-04 VITALS — BP 122/62 | HR 73 | Temp 98.4°F | Ht 68.25 in | Wt 182.0 lb

## 2012-10-04 DIAGNOSIS — E119 Type 2 diabetes mellitus without complications: Secondary | ICD-10-CM

## 2012-10-04 MED ORDER — PRAVASTATIN SODIUM 10 MG PO TABS: 10.0000 mg | ORAL_TABLET | Freq: Every day | ORAL | Status: DC

## 2012-10-04 NOTE — Progress Notes (Signed)
Subjective:    Patient ID: Curtis Frazier, male    DOB: [birth year], 72 y.o.   MRN: [id number]  HPI  Here for f/u of DM2 brought on by prednsone (for PMR)  Has been feeling pretty good overall  Is on prednisone 2 mg per day now and then will drop to 1 mg next month-doing better and better  No new pain   Still has some congestion from pollen - overall doing better with mucinex   Diabetes Home sugar results checks it in the am and most of the time is below 130  DM diet -tries to be pretty careful/ occasionally cheats with sweets (infrequent) Exercise -not as much as he should, now has time to exercise- getting in the garden soon - considers going to the gym  Symptoms- does stay very thirsty on the prednisone  A1C last  Lab Results  Component Value Date   HGBA1C 7.3* 09/26/2012  this is down form 7.8- gradually improving Wt is stable   No problems with medications  Renal protection  Last eye exam - aug 2013 and has his next app sched for this summer    Patient Active Problem List  Diagnosis  . DIABETES MELLITUS, TYPE II  . HYPERLIPIDEMIA  . HYPERKALEMIA  . ANEMIA-UNSPECIFIED  . ESSENTIAL HYPERTENSION, BENIGN  . C A D  . GERD  . ANGIODYSPLASIA-INTESTINE  . PSORIATIC ARTHRITIS  . PSORIASIS  . ACTINIC KERATOSIS  . ARTHRITIS, RHEUMATOID  . DEGENERATIVE DISC DISEASE  . SCOLIOSIS  . MURMUR  . CAROTID BRUIT  . HX, PERSONAL, COLONIC POLYPS  . TOBACCO ABUSE, HX OF  . New onset a-fib  . Aortic stenosis  . Hoarseness  . PMR (polymyalgia rheumatica)  . Low TSH level  . Prostate cancer screening  . BPH (benign prostatic hyperplasia)   Past Medical History  Diagnosis Date  . Other and unspecified hyperlipidemia   . Coronary atherosclerosis of unspecified type of vessel, native or graft   . Personal history of colonic polyps   . Actinic keratosis   . Psoriatic arthropathy   . Other psoriasis   . Scoliosis (and kyphoscoliosis), idiopathic   . GI bleed 12/10    cecam AVM   . Gastritis 12/10  . Hyperlipidemia     takes Pravastatin daily  . Hypertension     takes Metoprolol daily  . Myocardial infarction 2010  . Shortness of breath     with exertion  . Pneumonia     hx of-2008  . Headache     occasionally  . Joint pain   . Joint swelling   . Rheumatoid arthritis     takes Methotrexate 7pills weekly  . Degeneration of intervertebral disc, site unspecified   . Chronic back pain     scoliosis/stenosis/radiculopathy,degenerative disc disease  . Psoriasis   . Bruises easily   . Esophageal reflux     takes Omeprazole daily  . History of colonic polyps   . Hemorrhoids   . Kidney stone     hx of  . Blood transfusion 2010  . Type II or unspecified type diabetes mellitus without mention of complication, not stated as uncontrolled     type II;controlled by diet and exercise  . PMR (polymyalgia rheumatica) 01/20/2012    tx by specialist on a long prednisone taper   Past Surgical History  Procedure Laterality Date  . Rotator cuff repair  2000    left   . Colonoscopy    . Vasectomy  1979  . Ptca stent  02/2009    has one stent  . Carotid doppler  10/12    0-39% R and 60-79% left   . Cataract surgery  2012  . Cardiac catheterization  2010  . Lithotripsy  2009   History  Substance Use Topics  . Smoking status: Former Games developer  . Smokeless tobacco: Not on file     Comment: quit 2010  . Alcohol Use: Yes     Comment: occasional   Family History  Problem Relation Age of Onset  . Coronary artery disease Mother   . Diabetes Mother   . Heart disease Mother     CAD  . Coronary artery disease Sister   . Hypertension Sister   . Kidney failure Sister   . Heart disease Sister     CAD  . Pancreatic cancer Brother   . Diabetes Brother   . Prostate cancer Brother   . Cancer Brother     pancreatic CA  . Diabetes Brother   . Anesthesia problems Neg Hx   . Hypotension Neg Hx   . Malignant hyperthermia Neg Hx   . Pseudochol deficiency Neg Hx     Allergies  Allergen Reactions  . Penicillins Swelling and Rash  . Tetracycline Swelling and Rash   Current Outpatient Prescriptions on File Prior to Visit  Medication Sig Dispense Refill  . aspirin EC 81 MG tablet Take 81 mg by mouth daily.      . Cholecalciferol 1000 UNITS capsule Take 1,000 Units by mouth daily.        Marland Kitchen CLOBETASOL PROPIONATE E 0.05 % emollient cream Apply 1 application topically 2 (two) times daily as needed. For skin rash.      . clopidogrel (PLAVIX) 75 MG tablet TAKE 1 TABLET (75 MG TOTAL) BY MOUTH DAILY.  30 tablet  1  . fish oil-omega-3 fatty acids 1000 MG capsule Take 1 g by mouth daily.       . fluticasone (FLONASE) 50 MCG/ACT nasal spray Place 2 sprays into the nose daily.  16 g  6  . FOLIC ACID PO Take 1 tablet by mouth daily.       Marland Kitchen glucose blood test strip To check blood sugar for poorly controlled diabetes twice daily and as needed  100 each  3  . glucose blood test strip To check blood sugar for poorly controlled diabetes twice daily and as needed Band is contour      . metFORMIN (GLUCOPHAGE) 1000 MG tablet Take 1 tablet (1,000 mg total) by mouth 2 (two) times daily with a meal.  180 tablet  3  . methotrexate (RHEUMATREX) 2.5 MG tablet Take by mouth once a week. 7 TABS WEEKLY ./CY      . metoprolol succinate (TOPROL-XL) 25 MG 24 hr tablet TAKE 1 TABLET (25 MG TOTAL) BY MOUTH DAILY.  90 tablet  0  . multivitamin (THERAGRAN) per tablet Take 1 tablet by mouth daily.        . nitroGLYCERIN (NITROSTAT) 0.4 MG SL tablet Place 1 tablet (0.4 mg total) under the tongue every 5 (five) minutes as needed. For chest pain.  25 tablet  12  . omeprazole (PRILOSEC) 40 MG capsule Take 40 mg by mouth daily.        . pravastatin (PRAVACHOL) 10 MG tablet TAKE 1 TABLET BY MOUTH EVERY DAY  30 tablet  5  . predniSONE (DELTASONE) 1 MG tablet Take 7 mg by mouth daily.  No current facility-administered medications on file prior to visit.    Review of Systems Review of  Systems  Constitutional: Negative for fever, appetite change, fatigue and unexpected weight change.  Eyes: Negative for pain and visual disturbance.  Respiratory: Negative for cough and shortness of breath.   Cardiovascular: Negative for cp or palpitations    Gastrointestinal: Negative for nausea, diarrhea and constipation.  Genitourinary: Negative for urgency and frequency. neg for excessive thirst or urination  Skin: Negative for pallor or rash   Neurological: Negative for weakness, light-headedness, numbness and headaches.  Hematological: Negative for adenopathy. Does not bruise/bleed easily.  Psychiatric/Behavioral: Negative for dysphoric mood. The patient is not nervous/anxious.         Objective:   Physical Exam  Constitutional: He appears well-developed and well-nourished. No distress.  HENT:  Head: Normocephalic and atraumatic.  Mouth/Throat: Oropharynx is clear and moist.  Eyes: Conjunctivae and EOM are normal. Pupils are equal, round, and reactive to light. No scleral icterus.  Neck: Normal range of motion. Neck supple. No JVD present. Carotid bruit is present. No thyromegaly present.  Cardiovascular: Normal rate, regular rhythm and intact distal pulses.  Exam reveals no gallop.   Pulmonary/Chest: Effort normal and breath sounds normal. No respiratory distress. He has no wheezes. He exhibits no tenderness.  Diffusely distant bs   Abdominal: Soft. Bowel sounds are normal. He exhibits no abdominal bruit.  Musculoskeletal: He exhibits no edema.  Lymphadenopathy:    He has no cervical adenopathy.  Neurological: He is alert. He has normal reflexes. No cranial nerve deficit. He exhibits normal muscle tone. Coordination normal.  Skin: Skin is warm and dry. No rash noted. No pallor.  Psychiatric: He has a normal mood and affect.          Assessment & Plan:

## 2012-10-04 NOTE — Patient Instructions (Addendum)
A1c is down to 7.3 now -this continues to improve I expect your sugar to continue to improve as you go down on prednisone (and hopefully get off of it)  Eat a low sugar diet Try to get some exercise at least 5 days per week  Follow up in 6 months for annual exam with labs prior

## 2012-10-04 NOTE — Assessment & Plan Note (Signed)
Worsened in part by prednisone for PMR As his dose comes down so does sugar- this a1c down to 7.3 Will continue metformin and diet and qd glucose checks  Will be down to 1 mg next mo and hopefully get off of it  Plan to continue same medicines/watch diet and check lab/ f/u in 6 months

## 2012-10-14 ENCOUNTER — Other Ambulatory Visit: Payer: Medicare Other

## 2012-10-17 ENCOUNTER — Other Ambulatory Visit: Payer: Self-pay | Admitting: Family Medicine

## 2012-10-18 ENCOUNTER — Encounter: Payer: Medicare Other | Admitting: Family Medicine

## 2012-10-31 ENCOUNTER — Other Ambulatory Visit: Payer: Self-pay | Admitting: Family Medicine

## 2012-12-16 ENCOUNTER — Encounter: Payer: Self-pay | Admitting: Internal Medicine

## 2013-01-01 ENCOUNTER — Other Ambulatory Visit: Payer: Self-pay | Admitting: Family Medicine

## 2013-02-25 ENCOUNTER — Ambulatory Visit (INDEPENDENT_AMBULATORY_CARE_PROVIDER_SITE_OTHER): Payer: Medicare Other | Admitting: Internal Medicine

## 2013-02-25 ENCOUNTER — Encounter: Payer: Self-pay | Admitting: Internal Medicine

## 2013-02-25 VITALS — BP 134/68 | HR 62 | Temp 98.2°F | Wt 182.5 lb

## 2013-02-25 DIAGNOSIS — J209 Acute bronchitis, unspecified: Secondary | ICD-10-CM

## 2013-02-25 MED ORDER — AZITHROMYCIN 250 MG PO TABS: ORAL_TABLET | ORAL | Status: DC

## 2013-02-25 MED ORDER — HYDROCODONE-HOMATROPINE 5-1.5 MG/5ML PO SYRP: 5.0000 mL | ORAL_SOLUTION | Freq: Three times a day (TID) | ORAL | Status: DC | PRN

## 2013-02-25 NOTE — Patient Instructions (Signed)
Acute Bronchitis You have acute bronchitis. This means you have a chest cold. The airways in your lungs are red and sore (inflamed). Acute means it is sudden onset.  CAUSES Bronchitis is most often caused by the same virus that causes a cold. SYMPTOMS   Body aches.  Chest congestion.  Chills.  Cough.  Fever.  Shortness of breath.  Sore throat. TREATMENT  Acute bronchitis is usually treated with rest, fluids, and medicines for relief of fever or cough. Most symptoms should go away after a few days or a week. Increased fluids may help thin your secretions and will prevent dehydration. Your caregiver may give you an inhaler to improve your symptoms. The inhaler reduces shortness of breath and helps control cough. You can take over-the-counter pain relievers or cough medicine to decrease coughing, pain, or fever. A cool-air vaporizer may help thin bronchial secretions and make it easier to clear your chest. Antibiotics are usually not needed but can be prescribed if you smoke, are seriously ill, have chronic lung problems, are elderly, or you are at higher risk for developing complications.Allergies and asthma can make bronchitis worse. Repeated episodes of bronchitis may cause longstanding lung problems. Avoid smoking and secondhand smoke.Exposure to cigarette smoke or irritating chemicals will make bronchitis worse. If you are a cigarette smoker, consider using nicotine gum or skin patches to help control withdrawal symptoms. Quitting smoking will help your lungs heal faster. Recovery from bronchitis is often slow, but you should start feeling better after 2 to 3 days. Cough from bronchitis frequently lasts for 3 to 4 weeks. To prevent another bout of acute bronchitis:  Quit smoking.  Wash your hands frequently to get rid of viruses or use a hand sanitizer.  Avoid other people with cold or virus symptoms.  Try not to touch your hands to your mouth, nose, or eyes. SEEK IMMEDIATE  MEDICAL CARE IF:  You develop increased fever, chills, or chest pain.  You have severe shortness of breath or bloody sputum.  You develop dehydration, fainting, repeated vomiting, or a severe headache.  You have no improvement after 1 week of treatment or you get worse. MAKE SURE YOU:   Understand these instructions.  Will watch your condition.  Will get help right away if you are not doing well or get worse. Document Released: 07/06/2004 Document Revised: 08/21/2011 Document Reviewed: 09/21/2010 Exit[employer] Patient Information 2014 [employer]. Acute Bronchitis You have acute bronchitis. This means you have a chest cold. The airways in your lungs are red and sore (inflamed). Acute means it is sudden onset.  CAUSES Bronchitis is most often caused by the same virus that causes a cold. SYMPTOMS   Body aches.  Chest congestion.  Chills.  Cough.  Fever.  Shortness of breath.  Sore throat. TREATMENT  Acute bronchitis is usually treated with rest, fluids, and medicines for relief of fever or cough. Most symptoms should go away after a few days or a week. Increased fluids may help thin your secretions and will prevent dehydration. Your caregiver may give you an inhaler to improve your symptoms. The inhaler reduces shortness of breath and helps control cough. You can take over-the-counter pain relievers or cough medicine to decrease coughing, pain, or fever. A cool-air vaporizer may help thin bronchial secretions and make it easier to clear your chest. Antibiotics are usually not needed but can be prescribed if you smoke, are seriously ill, have chronic lung problems, are elderly, or you are at higher risk for developing complications.Allergies and   asthma can make bronchitis worse. Repeated episodes of bronchitis may cause longstanding lung problems. Avoid smoking and secondhand smoke.Exposure to cigarette smoke or irritating chemicals will make bronchitis worse. If you are a  cigarette smoker, consider using nicotine gum or skin patches to help control withdrawal symptoms. Quitting smoking will help your lungs heal faster. Recovery from bronchitis is often slow, but you should start feeling better after 2 to 3 days. Cough from bronchitis frequently lasts for 3 to 4 weeks. To prevent another bout of acute bronchitis:  Quit smoking.  Wash your hands frequently to get rid of viruses or use a hand sanitizer.  Avoid other people with cold or virus symptoms.  Try not to touch your hands to your mouth, nose, or eyes. SEEK IMMEDIATE MEDICAL CARE IF:  You develop increased fever, chills, or chest pain.  You have severe shortness of breath or bloody sputum.  You develop dehydration, fainting, repeated vomiting, or a severe headache.  You have no improvement after 1 week of treatment or you get worse. MAKE SURE YOU:   Understand these instructions.  Will watch your condition.  Will get help right away if you are not doing well or get worse. Document Released: 07/06/2004 Document Revised: 08/21/2011 Document Reviewed: 09/21/2010 Exit[employer] Patient Information 2014 [employer].  

## 2013-02-25 NOTE — Progress Notes (Signed)
HPI  Pt presents to the clinic today with c/o chest congestion. He reports that it started off with sinus drainage about 10 days ago. It has now progressed down into his chest. He has had fatigue, cough with productive tan sputum, low grade fevers.The cough is worse at night. He is having difficulty sleeping. He is not currently smoking but was a smoker for 54 years. He denies history of COPD or asthma. He has not had sick contacts that he is aware of.   Review of Systems      Past Medical History  Diagnosis Date  . Other and unspecified hyperlipidemia   . Coronary atherosclerosis of unspecified type of vessel, native or graft   . Personal history of colonic polyps   . Actinic keratosis   . Psoriatic arthropathy   . Other psoriasis   . Scoliosis (and kyphoscoliosis), idiopathic   . GI bleed 12/10    cecam AVM  . Gastritis 12/10  . Hyperlipidemia     takes Pravastatin daily  . Hypertension     takes Metoprolol daily  . Myocardial infarction 2010  . Shortness of breath     with exertion  . Pneumonia     hx of-2008  . Headache(784.0)     occasionally  . Joint pain   . Joint swelling   . Rheumatoid arthritis(714.0)     takes Methotrexate 7pills weekly  . Degeneration of intervertebral disc, site unspecified   . Chronic back pain     scoliosis/stenosis/radiculopathy,degenerative disc disease  . Psoriasis   . Bruises easily   . Esophageal reflux     takes Omeprazole daily  . History of colonic polyps   . Hemorrhoids   . Kidney stone     hx of  . Blood transfusion 2010  . Type II or unspecified type diabetes mellitus without mention of complication, not stated as uncontrolled     type II;controlled by diet and exercise  . PMR (polymyalgia rheumatica) 01/20/2012    tx by specialist on a long prednisone taper    Family History  Problem Relation Age of Onset  . Coronary artery disease Mother   . Diabetes Mother   . Heart disease Mother     CAD  . Coronary artery  disease Sister   . Hypertension Sister   . Kidney failure Sister   . Heart disease Sister     CAD  . Pancreatic cancer Brother   . Diabetes Brother   . Prostate cancer Brother   . Cancer Brother     pancreatic CA  . Diabetes Brother   . Anesthesia problems Neg Hx   . Hypotension Neg Hx   . Malignant hyperthermia Neg Hx   . Pseudochol deficiency Neg Hx     History   Social History  . Marital Status: Married    Spouse Name: N/A    Number of Children: N/A  . Years of Education: N/A   Occupational History  . Not on file.   Social History Main Topics  . Smoking status: Former Games developer  . Smokeless tobacco: Not on file     Comment: quit 2010  . Alcohol Use: Yes     Comment: occasional  . Drug Use: No  . Sexual Activity: No   Other Topics Concern  . Not on file   Social History Narrative  . No narrative on file    Allergies  Allergen Reactions  . Penicillins Swelling and Rash  . Tetracycline Swelling and Rash  Constitutional: Positive headache, fatigue and fever. Denies abrupt weight changes.  HEENT:  Positive sore throat. Denies eye redness, eye pain, pressure behind the eyes, facial pain, nasal congestion, ear pain, ringing in the ears, wax buildup, runny nose or bloody nose. Respiratory: Positive cough. Denies difficulty breathing or shortness of breath.  Cardiovascular: Denies chest pain, chest tightness, palpitations or swelling in the hands or feet.   No other specific complaints in a complete review of systems (except as listed in HPI above).  Objective:   BP 134/68  Pulse 62  Temp(Src) 98.2 F (36.8 C) (Oral)  Wt 182 lb 8 oz (82.781 kg)  BMI 27.53 kg/m2  SpO2 96% Wt Readings from Last 3 Encounters:  02/25/13 182 lb 8 oz (82.781 kg)  10/04/12 182 lb (82.555 kg)  10/01/12 182 lb (82.555 kg)     General: Appears his stated age, well developed, well nourished in NAD. HEENT: Head: normal shape and size; Eyes: sclera white, no icterus,  conjunctiva pink, PERRLA and EOMs intact; Ears: Tm's Mackley and intact, normal light reflex; Nose: mucosa pink and moist, septum midline; Throat/Mouth: + PND. Teeth present, mucosa erythematous and moist, no exudate noted, no lesions or ulcerations noted.  Neck: Mild cervical lymphadenopathy. Neck supple, trachea midline. No massses, lumps or thyromegaly present.  Cardiovascular: Normal rate and rhythm. S1,S2 noted.  No murmur, rubs or gallops noted. No JVD or BLE edema. No carotid bruits noted. Pulmonary/Chest: Normal effort and scattered rhonchi in bilateral bases. No respiratory distress. No wheezes, rales  noted.      Assessment & Plan:   Acute bronchitis:  Get some rest and drink plenty of water Do salt water gargles for the sore throat Given duration of symptoms, will give eRx for Azithromax x 5 days eRx for Hycodan cough syrup  RTC as needed or if symptoms persist.

## 2013-03-27 ENCOUNTER — Encounter: Payer: Self-pay | Admitting: Internal Medicine

## 2013-04-12 ENCOUNTER — Other Ambulatory Visit: Payer: Self-pay | Admitting: Family Medicine

## 2013-04-16 ENCOUNTER — Encounter (INDEPENDENT_AMBULATORY_CARE_PROVIDER_SITE_OTHER): Payer: Self-pay

## 2013-04-16 ENCOUNTER — Ambulatory Visit (INDEPENDENT_AMBULATORY_CARE_PROVIDER_SITE_OTHER): Payer: Medicare Other | Admitting: Cardiovascular Disease

## 2013-04-16 ENCOUNTER — Other Ambulatory Visit: Payer: Self-pay | Admitting: *Deleted

## 2013-04-16 ENCOUNTER — Encounter: Payer: Self-pay | Admitting: Cardiovascular Disease

## 2013-04-16 ENCOUNTER — Ambulatory Visit (HOSPITAL_COMMUNITY): Payer: Medicare Other | Attending: Cardiovascular Disease

## 2013-04-16 VITALS — BP 120/58 | HR 60

## 2013-04-16 DIAGNOSIS — I10 Essential (primary) hypertension: Secondary | ICD-10-CM | POA: Insufficient documentation

## 2013-04-16 DIAGNOSIS — I6523 Occlusion and stenosis of bilateral carotid arteries: Secondary | ICD-10-CM

## 2013-04-16 DIAGNOSIS — I658 Occlusion and stenosis of other precerebral arteries: Secondary | ICD-10-CM

## 2013-04-16 DIAGNOSIS — E785 Hyperlipidemia, unspecified: Secondary | ICD-10-CM

## 2013-04-16 DIAGNOSIS — Z87891 Personal history of nicotine dependence: Secondary | ICD-10-CM | POA: Insufficient documentation

## 2013-04-16 DIAGNOSIS — R079 Chest pain, unspecified: Secondary | ICD-10-CM

## 2013-04-16 DIAGNOSIS — E119 Type 2 diabetes mellitus without complications: Secondary | ICD-10-CM | POA: Insufficient documentation

## 2013-04-16 DIAGNOSIS — R0989 Other specified symptoms and signs involving the circulatory and respiratory systems: Secondary | ICD-10-CM | POA: Insufficient documentation

## 2013-04-16 DIAGNOSIS — R51 Headache: Secondary | ICD-10-CM | POA: Insufficient documentation

## 2013-04-16 DIAGNOSIS — I739 Peripheral vascular disease, unspecified: Secondary | ICD-10-CM | POA: Insufficient documentation

## 2013-04-16 DIAGNOSIS — I6529 Occlusion and stenosis of unspecified carotid artery: Secondary | ICD-10-CM

## 2013-04-16 DIAGNOSIS — I2510 Atherosclerotic heart disease of native coronary artery without angina pectoris: Secondary | ICD-10-CM | POA: Insufficient documentation

## 2013-04-16 MED ORDER — PRAVASTATIN SODIUM 20 MG PO TABS: 20.0000 mg | ORAL_TABLET | Freq: Every day | ORAL | Status: DC

## 2013-04-16 NOTE — Assessment & Plan Note (Signed)
Atypical chest pain History of stent to circumflex  F/U ETT

## 2013-04-16 NOTE — Assessment & Plan Note (Signed)
Discussed low carb diet.  Target hemoglobin A1c is 6.5 or less.  Continue current medications.  

## 2013-04-16 NOTE — Progress Notes (Signed)
Patient ID: Curtis Frazier, male   DOB: [birth year], 72 y.o.   MRN: [id number] Curtis Frazier is seen today For F/U of CAD, carotid disease. He has a BMS in his circ from [facility] in 02/2009. GI bleed last year from AVM. He had a normal myovue in January 2011 with no ischemia and normal LV 1/13 had lumbar surgery with PAF in setting of post op anemia and pain. Converted with amiodarone and continues on only beta blocker now On prednisone taper for PMR This has improved his hip and knee pain. Last carotids 09/27/12 reviewed and Left ICA 60-79% stenosis. No TIA symptoms. Continue antiplatelet Rx   Reviewed duplex from today 60-79% LICA peak veloity 163 cm/sec  LDL 131 in April.  A1c 7.3    Having some SSCP . Sharp not long lasting Has not taken nitro.  Not always exertional  Had some polymyalgia of hips and prednisone shot BS up    ROS: Denies fever, malais, weight loss, blurry vision, decreased visual acuity, cough, sputum, SOB, hemoptysis, pleuritic pain, palpitaitons, heartburn, abdominal pain, melena, lower extremity edema, claudication, or rash.  All other systems reviewed and negative  General: Affect appropriate Healthy:  appears stated age HEENT: normal Neck supple with no adenopathy JVP normal left bruits no thyromegaly Lungs clear with no wheezing and good diaphragmatic motion Heart:  S1/S2 no murmur, no rub, gallop or click PMI normal Abdomen: benighn, BS positve, no tenderness, no AAA no bruit.  No HSM or HJR Distal pulses intact with no bruits No edema Neuro non-focal Skin warm and dry No muscular weakness   Current Outpatient Prescriptions  Medication Sig Dispense Refill  . aspirin EC 81 MG tablet Take 81 mg by mouth daily.      . Cholecalciferol 1000 UNITS capsule Take 1,000 Units by mouth daily.        . clopidogrel (PLAVIX) 75 MG tablet TAKE 1 TABLET (75 MG TOTAL) BY MOUTH DAILY.  30 tablet  5  . fish oil-omega-3 fatty acids 1000 MG capsule Take 1 g by mouth daily.       . fluticasone  (FLONASE) 50 MCG/ACT nasal spray USE 2 SPRAYS IN EACH NOSTRIL ONCE A DAY  16 g  2  . FOLIC ACID PO Take 1 tablet by mouth daily.       Marland Kitchen glucose blood test strip To check blood sugar for poorly controlled diabetes twice daily and as needed  100 each  3  . glucose blood test strip To check blood sugar for poorly controlled diabetes twice daily and as needed Band is contour      . HYDROcodone-homatropine (HYCODAN) 5-1.5 MG/5ML syrup Take 5 mLs by mouth every 8 (eight) hours as needed for cough.  120 mL  0  . metFORMIN (GLUCOPHAGE) 1000 MG tablet Take 1 tablet (1,000 mg total) by mouth 2 (two) times daily with a meal.  180 tablet  3  . methotrexate (RHEUMATREX) 2.5 MG tablet Take by mouth once a week. 7 TABS WEEKLY ./CY      . metoprolol succinate (TOPROL-XL) 25 MG 24 hr tablet TAKE 1 TABLET (25 MG TOTAL) BY MOUTH DAILY.  90 tablet  1  . nitroGLYCERIN (NITROSTAT) 0.4 MG SL tablet Place 1 tablet (0.4 mg total) under the tongue every 5 (five) minutes as needed. For chest pain.  25 tablet  12  . omeprazole (PRILOSEC) 40 MG capsule Take 40 mg by mouth daily.        . pravastatin (PRAVACHOL) 10 MG  tablet TAKE 1 TABLET BY MOUTH EVERY DAY  30 tablet  7   No current facility-administered medications for this visit.    Allergies  Penicillins and Tetracycline  Electrocardiogram: 4/22 SR rate 62 LAE otherwise normal   Assessment and Plan

## 2013-04-16 NOTE — Assessment & Plan Note (Signed)
Well controlled.  Continue current medications and low sodium Dash type diet.    

## 2013-04-16 NOTE — Patient Instructions (Signed)
Your physician wants you to follow-up in:   6 MONTHS   WITH DR Eden Emms CAROTID  SAME DAY You will receive a reminder letter in the mail two months in advance. If you don't receive a letter, please call our office to schedule the follow-up appointment. Your physician recommends that you continue on your current medications as directed. Please refer to the Current Medication list given to you today.  Your physician has requested that you have an exercise tolerance test. For further information please visit [email or web address] Please also follow instruction sheet, as given.  OKAY TO DO  IN  December 2014 Your physician has requested that you have a carotid duplex. This test is an ultrasound of the carotid arteries in your neck. It looks at blood flow through these arteries that supply the brain with blood. Allow one hour for this exam. There are no restrictions or special instructions. 6 MONTHS

## 2013-04-16 NOTE — Assessment & Plan Note (Signed)
Known vascular disease CAD/Carotid  Increase pravastatin to 20mg  f/u labs 6 months

## 2013-04-16 NOTE — Assessment & Plan Note (Signed)
Left ICA 60-79% stenosis.  No TIA symptoms.  Continue antiplatelet Rx and F/U carotid duplex in 6 months   

## 2013-04-18 ENCOUNTER — Other Ambulatory Visit: Payer: Self-pay | Admitting: *Deleted

## 2013-04-18 ENCOUNTER — Other Ambulatory Visit: Payer: Self-pay | Admitting: Family Medicine

## 2013-04-18 MED ORDER — METFORMIN HCL 1000 MG PO TABS: 1000.0000 mg | ORAL_TABLET | Freq: Two times a day (BID) | ORAL | Status: DC

## 2013-05-21 ENCOUNTER — Other Ambulatory Visit: Payer: Self-pay | Admitting: *Deleted

## 2013-05-21 MED ORDER — GLUCOSE BLOOD VI STRP: ORAL_STRIP | Status: DC

## 2013-05-23 ENCOUNTER — Encounter: Payer: Self-pay | Admitting: Cardiovascular Disease

## 2013-05-23 ENCOUNTER — Ambulatory Visit (INDEPENDENT_AMBULATORY_CARE_PROVIDER_SITE_OTHER): Payer: Medicare Other | Admitting: Nurse Practitioner

## 2013-05-23 ENCOUNTER — Encounter: Payer: Medicare Other | Admitting: Nurse Practitioner

## 2013-05-23 ENCOUNTER — Encounter: Payer: Self-pay | Admitting: Nurse Practitioner

## 2013-05-23 ENCOUNTER — Ambulatory Visit: Payer: Medicare Other | Admitting: Nurse Practitioner

## 2013-05-23 VITALS — Ht 68.5 in

## 2013-05-23 VITALS — BP 127/58 | HR 70

## 2013-05-23 DIAGNOSIS — R079 Chest pain, unspecified: Secondary | ICD-10-CM

## 2013-05-23 NOTE — Progress Notes (Signed)
Exercise Treadmill Test  Pre-Exercise Testing Evaluation Rhythm: normal sinus  Rate: 66     Test  Exercise Tolerance Test Ordering MD: Charlton Haws, MD  Interpreting MD: Norma Fredrickson, NP  Unique Test No: 1  Treadmill:  1  Indication for ETT: chest pain - rule out ischemia  Contraindication to ETT: No   Stress Modality: exercise - treadmill  Cardiac Imaging Performed: non   Protocol: standard Bruce - maximal  Max BP:  157/58  Max MPHR (bpm):  147 85% MPR (bpm):  125  MPHR obtained (bpm):  112 % MPHR obtained:  76%  Reached 85% MPHR (min:sec):  NA Total Exercise Time (min-sec):  4:36  Workload in METS:  6.8 Borg Scale: 15  Reason ETT Terminated:  patient's desire to stop    ST Segment Analysis At Rest: normal ST segments - no evidence of significant ST depression With Exercise: non-specific ST changes  Other Information Arrhythmia:  No Angina during ETT:  absent (0) Quality of ETT:  non-diagnostic  ETT Interpretation:  Non-diagnostic - target not achieved.  Comments: Patient presents today for routine GXT. Has known CAD with past PCI in 2010. Negative Myoview in 2011. Now with some atypical chest pain.   Today the patient exercised on the standard Bruce protocol for a total of 4:36 minutes.  Reduced exercise tolerance.  Adequate blood pressure response.  Clinically negative for chest pain. Test was stopped due to fatigue/patient desire to stop.  EKG negative for ischemia but the study is non diagnostic due to target HR not achieved. Patient took his beta blocker today as well. No significant arrhythmia noted.   Recommendations: Lexiscan.  Further disposition to follow.  Patient is agreeable to this plan and will call if any problems develop in the interim.   Rosalio Macadamia, RN, ANP-C [facility] [address] [place], [place]  [address]

## 2013-05-30 ENCOUNTER — Encounter: Payer: Self-pay | Admitting: Cardiology

## 2013-05-30 ENCOUNTER — Ambulatory Visit (HOSPITAL_COMMUNITY): Payer: Medicare Other | Attending: Cardiology | Admitting: Radiology

## 2013-05-30 VITALS — BP 131/64 | HR 61 | Ht 68.0 in | Wt 173.0 lb

## 2013-05-30 DIAGNOSIS — R079 Chest pain, unspecified: Secondary | ICD-10-CM

## 2013-05-30 DIAGNOSIS — I2510 Atherosclerotic heart disease of native coronary artery without angina pectoris: Secondary | ICD-10-CM

## 2013-05-30 MED ORDER — REGADENOSON 0.4 MG/5ML IV SOLN
0.4000 mg | Freq: Once | INTRAVENOUS | Status: AC
  Administered 2013-05-30: 0.4 mg via INTRAVENOUS

## 2013-05-30 MED ORDER — TECHNETIUM TC 99M SESTAMIBI GENERIC - CARDIOLITE
11.0000 | Freq: Once | INTRAVENOUS | Status: AC | PRN
  Administered 2013-05-30: 11 via INTRAVENOUS

## 2013-05-30 MED ORDER — TECHNETIUM TC 99M SESTAMIBI GENERIC - CARDIOLITE
33.0000 | Freq: Once | INTRAVENOUS | Status: AC | PRN
  Administered 2013-05-30: 33 via INTRAVENOUS

## 2013-05-30 NOTE — Progress Notes (Signed)
[facility] SITE 3 NUCLEAR MED [address] [place], [place] [address] [phone]    Cardiology Nuclear Med Study  Curtis Frazier is a 72 y.o. male     MRN : [id number]     DOB: [birth year]  Procedure Date: 05/30/2013  Nuclear Med Background Indication for Stress Test:  Evaluation for Ischemia and Stent Patency History:  CAD, MI, Cath, Stent (Cfx), Echo 2013 EF 60-65%, MPI 2012 (Normal, Scar) EF 74%, COPD Cardiac Risk Factors: Carotid Disease, Family History - CAD, History of Smoking, Hypertension, Lipids, NIDDM and PVD  Symptoms:  Chest Pain (last date of chest discomfort was one month ago)   Nuclear Pre-Procedure Caffeine/Decaff Intake:  None > 12 hrs NPO After: 6:30pm   Lungs:  clear O2 Sat: 93% on room air. IV 0.9% NS with Angio Cath:  20g  IV Site: R Antecubital x 1, tolerated well IV Started by:  Irean Hong, RN  Chest Size (in):  42 Cup Size: n/a  Height: 5\' 8"  (1.727 m)  Weight:  173 lb (78.472 kg)  BMI:  Body mass index is 26.31 kg/(m^2). Tech Comments:  Held metformin today, and took toprol last night.    Nuclear Med Study 1 or 2 day study: 1 day  Stress Test Type:  Treadmill/Lexiscan  Reading MD: Willa Rough, MD  Order Authorizing Provider:  Charlton Haws, MD, and Norma Fredrickson, NP  Resting Radionuclide: Technetium 81m Sestamibi  Resting Radionuclide Dose: 11.0 mCi   Stress Radionuclide:  Technetium 53m Sestamibi  Stress Radionuclide Dose: 33.0 mCi           Stress Protocol Rest HR: 61 Stress HR: 85  Rest BP: 131/64 Stress BP: 164/82  Exercise Time (min): n/a METS: n/a           Dose of Adenosine (mg):  n/a Dose of Lexiscan: 0.4 mg  Dose of Atropine (mg): n/a Dose of Dobutamine: n/a mcg/kg/min (at max HR)  Stress Test Technologist: Nelson Chimes, BS-ES  Nuclear Technologist:  Dario Guardian, CNMT     Rest Procedure:  Myocardial perfusion imaging was performed at rest 45 minutes following the intravenous administration of Technetium 22m  Sestamibi. Rest ECG: Normal EKG  Stress Procedure:  The patient received IV Lexiscan 0.4 mg over 15-seconds with concurrent low level exercise and then Technetium 44m Sestamibi was injected at 30-seconds while the patient continued walking one more minute.  Quantitative spect images were obtained after a 45-minute delay.  During the infusion of Lexiscan, the patient complained of SOB.  This resolved in recovery.  Stress ECG: No significant change from baseline ECG  QPS Raw Data Images:  Patient motion noted; appropriate software correction applied. Stress Images:  Small area of mild decreased activity at the apical cap and apical lateral segment Rest Images:  Slight decreased activity at the apical cap Subtraction (SDS):  Slight reversibility at the apical cap Transient Ischemic Dilatation (Normal <1.22):  0.98 Lung/Heart Ratio (Normal <0.45):  0.34  Quantitative Gated Spect Images QGS EDV:  106 ml QGS ESV:  30 ml  Impression Exercise Capacity:  Lexiscan with low level exercise. BP Response:  Normal blood pressure response. Clinical Symptoms:  Shortness of breath ECG Impression:  No significant ST segment change suggestive of ischemia. Comparison with Prior Nuclear Study: No images to compare  Overall Impression:  This is a low risk scan. There is excellent wall motion. I cannot rule out the possibility of slight ischemia at the apical cap, but this is always a  borderline call. There are no large areas of scar or ischemia.  LV Ejection Fraction: 71%.  LV Wall Motion:  Normal Wall Motion.  Willa Rough, MD

## 2013-06-29 ENCOUNTER — Telehealth: Payer: Self-pay | Admitting: Family Medicine

## 2013-06-29 DIAGNOSIS — E785 Hyperlipidemia, unspecified: Secondary | ICD-10-CM

## 2013-06-29 DIAGNOSIS — E875 Hyperkalemia: Secondary | ICD-10-CM

## 2013-06-29 DIAGNOSIS — E119 Type 2 diabetes mellitus without complications: Secondary | ICD-10-CM

## 2013-06-29 DIAGNOSIS — D649 Anemia, unspecified: Secondary | ICD-10-CM

## 2013-06-29 DIAGNOSIS — R7989 Other specified abnormal findings of blood chemistry: Secondary | ICD-10-CM

## 2013-06-29 DIAGNOSIS — I10 Essential (primary) hypertension: Secondary | ICD-10-CM

## 2013-06-29 NOTE — Telephone Encounter (Signed)
Message copied by Abner Greenspan on Sun Jun 29, 2013  7:26 PM ------      Message from: Selinda Orion J      Created: Thu Jun 19, 2013 10:55 AM      Regarding: Lab orders for 1.19.15       Patient is scheduled for CPX labs, please order future labs, Thanks , Terri       ------

## 2013-06-30 ENCOUNTER — Other Ambulatory Visit: Payer: Medicare Other

## 2013-07-04 ENCOUNTER — Other Ambulatory Visit: Payer: Self-pay | Admitting: Family Medicine

## 2013-07-07 ENCOUNTER — Ambulatory Visit (INDEPENDENT_AMBULATORY_CARE_PROVIDER_SITE_OTHER): Payer: Medicare Other | Admitting: Family Medicine

## 2013-07-07 ENCOUNTER — Encounter: Payer: Self-pay | Admitting: Family Medicine

## 2013-07-07 VITALS — BP 112/78 | HR 54 | Temp 98.5°F | Ht 68.0 in | Wt 174.2 lb

## 2013-07-07 DIAGNOSIS — E119 Type 2 diabetes mellitus without complications: Secondary | ICD-10-CM

## 2013-07-07 DIAGNOSIS — E875 Hyperkalemia: Secondary | ICD-10-CM

## 2013-07-07 DIAGNOSIS — Z0000 Encounter for general adult medical examination without abnormal findings: Secondary | ICD-10-CM

## 2013-07-07 DIAGNOSIS — E785 Hyperlipidemia, unspecified: Secondary | ICD-10-CM

## 2013-07-07 DIAGNOSIS — N400 Enlarged prostate without lower urinary tract symptoms: Secondary | ICD-10-CM

## 2013-07-07 DIAGNOSIS — I10 Essential (primary) hypertension: Secondary | ICD-10-CM

## 2013-07-07 DIAGNOSIS — Z125 Encounter for screening for malignant neoplasm of prostate: Secondary | ICD-10-CM

## 2013-07-07 LAB — COMPREHENSIVE METABOLIC PANEL
ALBUMIN: 3.9 g/dL (ref 3.5–5.2)
ALK PHOS: 70 U/L (ref 39–117)
ALT: 13 U/L (ref 0–53)
AST: 16 U/L (ref 0–37)
BUN: 22 mg/dL (ref 6–23)
CALCIUM: 9.5 mg/dL (ref 8.4–10.5)
CHLORIDE: 106 meq/L (ref 96–112)
CO2: 27 mEq/L (ref 19–32)
Creatinine, Ser: 1.4 mg/dL (ref 0.4–1.5)
GFR: 53.99 mL/min — ABNORMAL LOW (ref >60.00)
GLUCOSE: 112 mg/dL — AB (ref 70–99)
POTASSIUM: 4.6 meq/L (ref 3.5–5.1)
SODIUM: 140 meq/L (ref 135–145)
TOTAL PROTEIN: 7.4 g/dL (ref 6.0–8.3)
Total Bilirubin: 0.6 mg/dL (ref 0.3–1.2)

## 2013-07-07 LAB — LIPID PANEL
CHOL/HDL RATIO: 5
Cholesterol: 189 mg/dL (ref 0–200)
HDL: 39 mg/dL — AB (ref >39.00)
LDL Cholesterol: 129 mg/dL — ABNORMAL HIGH (ref 0–99)
Triglycerides: 105 mg/dL (ref 0.0–149.0)
VLDL: 21 mg/dL (ref 0.0–40.0)

## 2013-07-07 LAB — CBC WITH DIFFERENTIAL/PLATELET
Basophils Absolute: 0 10*3/uL (ref 0.0–0.1)
Basophils Relative: 0.3 % (ref 0.0–3.0)
EOS PCT: 0.7 % (ref 0.0–5.0)
Eosinophils Absolute: 0.1 10*3/uL (ref 0.0–0.7)
HCT: 37.2 % — ABNORMAL LOW (ref 39.0–52.0)
Hemoglobin: 12.2 g/dL — ABNORMAL LOW (ref 13.0–17.0)
LYMPHS PCT: 39.9 % (ref 12.0–46.0)
Lymphs Abs: 3.5 10*3/uL (ref 0.7–4.0)
MCHC: 32.8 g/dL (ref 30.0–36.0)
MCV: 94.4 fl (ref 78.0–100.0)
MONO ABS: 0.6 10*3/uL (ref 0.1–1.0)
Monocytes Relative: 6.6 % (ref 3.0–12.0)
NEUTROS PCT: 52.5 % (ref 43.0–77.0)
Neutro Abs: 4.6 10*3/uL (ref 1.4–7.7)
Platelets: 272 10*3/uL (ref 150.0–400.0)
RBC: 3.94 Mil/uL — ABNORMAL LOW (ref 4.22–5.81)
RDW: 16.3 % — ABNORMAL HIGH (ref 11.5–14.6)
WBC: 8.7 10*3/uL (ref 4.5–10.5)

## 2013-07-07 LAB — HEMOGLOBIN A1C: HEMOGLOBIN A1C: 7 % — AB (ref 4.6–6.5)

## 2013-07-07 LAB — TSH: TSH: 0.68 u[IU]/mL (ref 0.35–5.50)

## 2013-07-07 NOTE — Patient Instructions (Signed)
Take care of yourself  Keep working on a low fat and low sugar diet  Don't forget to call and make your colonoscopy appointment

## 2013-07-07 NOTE — Assessment & Plan Note (Signed)
BP: 112/78 mmHg  bp in fair control at this time  No changes needed Disc lifstyle change with low sodium diet and exercise   Lab today

## 2013-07-07 NOTE — Progress Notes (Signed)
Subjective:    Patient ID: Curtis Frazier, male    DOB: 1940-09-23, 73 y.o.   MRN: [id number]  HPI I have personally reviewed the Medicare Annual Wellness questionnaire and have noted 1. The patient's medical and social history 2. Their use of alcohol, tobacco or illicit drugs 3. Their current medications and supplements 4. The patient's functional ability including ADL's, fall risks, home safety risks and hearing or visual             impairment. 5. Diet and physical activities 6. Evidence for depression or mood disorders  The patients weight, height, BMI have been recorded in the chart and visual acuity is per eye clinic.  I have made referrals, counseling and provided education to the patient based review of the above and I have provided the pt with a written personalized care plan for preventive services.  Getting ready to change to Remicade from methotrexate due to his RA/psoratic arthritis   See scanned forms.  Routine anticipatory guidance given to patient.  See health maintenance. Flu shot -had in oct 2/14 at [facility]  Shingles vaccine- ? If he can get due to arthritis meds - live vaccine  PNA 8/08 Tetanus vaccin 12/13 Colonoscopy in 12/10 - due for his recall - he will make his own appt - sent him a reminder  Prostate cancer screening last psa 4/14 Advance directive- does not have adv dir -given a packet on how to do that  Cognitive function addressed- see scanned forms- and if abnormal then additional documentation follows. - he thinks memory is better than it was a few years ago - stays busy (had memory issues after his last back surgery)   PMH and SH reviewed  Meds, vitals, and allergies reviewed.   ROS: See HPI.  Otherwise negative.     bp is stable today  No cp or palpitations or headaches or edema  No side effects to medicines  BP Readings from Last 3 Encounters:  07/07/13 112/78  05/30/13 131/64  05/23/13 127/58     Hyperlipidemia On pravastatin  Diet - has  been fair / not optimal   "hard to stay on track" -occ fried chicken or sausage biscuit Due for labs today   BPH- doing ok - no change  Nocturia is only one time  Same frequency during the day  Prostate ca screen Lab Results  Component Value Date   PSA 0.74 09/26/2012   PSA 0.63 04/19/2007   PSA .61 11/27/2005     Diabetes Home sugar results - avg one teens in the am  DM diet - very little junk food , he watches sugar (130-140 with prednisone 5 mg)  Exercise - some/ not as much as he would like to  Symptoms-none  A1C last  Lab Results  Component Value Date   HGBA1C 7.3* 09/26/2012  this was after prednisone - has been on it on and off- the main problem Overdue for check today  No problems with medications - on metformin  Renal protection Last eye exam 8/14- stable (did have cataract surgery)     Patient Active Problem List   Diagnosis Date Noted  . Encounter for Medicare annual wellness exam 07/07/2013  . BPH (benign prostatic hyperplasia) 07/05/2012  . Prostate cancer screening 06/27/2012  . Low TSH level 01/22/2012  . PMR (polymyalgia rheumatica) 01/20/2012  . Hoarseness 01/19/2012  . Aortic stenosis 07/08/2011  . New onset a-fib 07/05/2011    Class: Acute  . HYPERKALEMIA 02/18/2010  .  ESSENTIAL HYPERTENSION, BENIGN 09/13/2009  . ANEMIA-UNSPECIFIED 06/10/2009  . ANGIODYSPLASIA-INTESTINE 06/10/2009  . MURMUR 05/21/2009  . CAROTID BRUIT 05/21/2009  . C A D 03/09/2009  . GERD 03/09/2009  . ARTHRITIS, RHEUMATOID 04/29/2008  . HX, PERSONAL, COLONIC POLYPS 01/17/2007  . DIABETES MELLITUS, TYPE II 09/18/2006  . HYPERLIPIDEMIA 09/18/2006  . PSORIATIC ARTHRITIS 09/18/2006  . PSORIASIS 09/18/2006  . 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 DEGENERATIVE DISC DISEASE 09/18/2006  . SCOLIOSIS 09/18/2006  . TOBACCO ABUSE, HX OF 09/18/2006   Past Medical History  Diagnosis Date  . Other and unspecified hyperlipidemia   . Coronary atherosclerosis of unspecified type of vessel,  native or graft   . Personal history of colonic polyps   . Actinic keratosis   . Psoriatic arthropathy   . Other psoriasis   . Scoliosis (and kyphoscoliosis), idiopathic   . GI bleed 12/10    cecam AVM  . Gastritis 12/10  . Hyperlipidemia     takes Pravastatin daily  . Hypertension     takes Metoprolol daily  . Myocardial infarction 2010  . Shortness of breath     with exertion  . Pneumonia     hx of-2008  . Headache(784.0)     occasionally  . Joint pain   . Joint swelling   . Rheumatoid arthritis(714.0)     takes Methotrexate 7pills weekly  . Degeneration of intervertebral disc, site unspecified   . Chronic back pain     scoliosis/stenosis/radiculopathy,degenerative disc disease  . Psoriasis   . Bruises easily   . Esophageal reflux     takes Omeprazole daily  . History of colonic polyps   . Hemorrhoids   . Kidney stone     hx of  . Blood transfusion 2010  . Type II or unspecified type diabetes mellitus without mention of complication, not stated as uncontrolled     type II;controlled by diet and exercise  . PMR (polymyalgia rheumatica) 01/20/2012    tx by specialist on a long prednisone taper   Past Surgical History  Procedure Laterality Date  . Rotator cuff repair  2000    left   . Colonoscopy    . Vasectomy  1979  . Ptca stent  02/2009    has one stent  . Carotid doppler  10/12    0-39% R and 60-79% left   . Cataract surgery  2012  . Cardiac catheterization  2010  . Lithotripsy  2009   History  Substance Use Topics  . Smoking status: Former Research scientist (life sciences)  . Smokeless tobacco: Not on file     Comment: quit 2010  . Alcohol Use: Yes     Comment: occasional   Family History  Problem Relation Age of Onset  . Coronary artery disease Mother   . Diabetes Mother   . Heart disease Mother     CAD  . Coronary artery disease Sister   . Hypertension Sister   . Kidney failure Sister   . Heart disease Sister     CAD  . Pancreatic cancer Brother   . Diabetes  Brother   . Prostate cancer Brother   . Cancer Brother     pancreatic CA  . Diabetes Brother   . Anesthesia problems Neg Hx   . Hypotension Neg Hx   . Malignant hyperthermia Neg Hx   . Pseudochol deficiency Neg Hx    Allergies  Allergen Reactions  . Penicillins Swelling and Rash  . Tetracycline Swelling and Rash   Current Outpatient Prescriptions  on File Prior to Visit  Medication Sig Dispense Refill  . aspirin EC 81 MG tablet Take 81 mg by mouth daily.      . Cholecalciferol 1000 UNITS capsule Take 1,000 Units by mouth daily.        . clopidogrel (PLAVIX) 75 MG tablet TAKE 1 TABLET (75 MG TOTAL) BY MOUTH DAILY.  30 tablet  2  . fish oil-omega-3 fatty acids 1000 MG capsule Take 1 g by mouth daily.       . fluticasone (FLONASE) 50 MCG/ACT nasal spray USE 2 SPRAYS IN EACH NOSTRIL ONCE A DAY  16 g  2  . FOLIC ACID PO Take 1 tablet by mouth daily.       Marland Kitchen glucose blood test strip To check blood sugar for poorly controlled diabetes twice daily and as needed. Dx: 250.00  100 each  3  . HYDROcodone-homatropine (HYCODAN) 5-1.5 MG/5ML syrup Take 5 mLs by mouth every 8 (eight) hours as needed for cough.  120 mL  0  . metFORMIN (GLUCOPHAGE) 1000 MG tablet Take 1 tablet (1,000 mg total) by mouth 2 (two) times daily with a meal.  180 tablet  1  . methotrexate (RHEUMATREX) 2.5 MG tablet Take by mouth once a week. 7 TABS WEEKLY ./CY      . metoprolol succinate (TOPROL-XL) 25 MG 24 hr tablet TAKE 1 TABLET (25 MG TOTAL) BY MOUTH DAILY.  90 tablet  0  . nitroGLYCERIN (NITROSTAT) 0.4 MG SL tablet Place 1 tablet (0.4 mg total) under the tongue every 5 (five) minutes as needed. For chest pain.  25 tablet  12  . omeprazole (PRILOSEC) 40 MG capsule Take 40 mg by mouth daily.        . pravastatin (PRAVACHOL) 20 MG tablet Take 1 tablet (20 mg total) by mouth daily.  30 tablet  11   No current facility-administered medications on file prior to visit.     Review of Systems Review of Systems    Constitutional: Negative for fever, appetite change, fatigue and unexpected weight change.  Eyes: Negative for pain and visual disturbance.  Respiratory: Negative for cough and shortness of breath.   Cardiovascular: Negative for cp or palpitations    Gastrointestinal: Negative for nausea, diarrhea and constipation.  Genitourinary: Negative for urgency and pos for frequency that is stable  Skin: Negative for pallor or rash   Neurological: Negative for weakness, light-headedness, numbness and headaches.  Hematological: Negative for adenopathy. Does not bruise/bleed easily.  Psychiatric/Behavioral: Negative for dysphoric mood. The patient is not nervous/anxious.         Objective:   Physical Exam  Constitutional: He is oriented to person, place, and time. He appears well-developed and well-nourished. No distress.  HENT:  Head: Normocephalic and atraumatic.  Right Ear: External ear normal.  Left Ear: External ear normal.  Nose: Nose normal.  Mouth/Throat: Oropharynx is clear and moist.  Eyes: Conjunctivae and EOM are normal. Pupils are equal, round, and reactive to light. Right eye exhibits no discharge. Left eye exhibits no discharge. No scleral icterus.  Neck: Normal range of motion. Neck supple. No JVD present. Carotid bruit is not present. No thyromegaly present.  Cardiovascular: Normal rate, regular rhythm, normal heart sounds and intact distal pulses.  Exam reveals no gallop.   Pulmonary/Chest: Effort normal and breath sounds normal. No respiratory distress. He has no wheezes. He has no rales.  Abdominal: Soft. Bowel sounds are normal. He exhibits no distension, no abdominal bruit and no mass. There  is no tenderness.  Genitourinary: Rectum normal. Rectal exam shows no external hemorrhoid, no mass and anal tone normal. Prostate is enlarged.  Baseline enlarged prostate Symmetric/ nt/ firm/   Musculoskeletal: Normal range of motion. He exhibits no edema and no tenderness.   Lymphadenopathy:    He has no cervical adenopathy.  Neurological: He is alert and oriented to person, place, and time. He has normal reflexes. No cranial nerve deficit. He exhibits normal muscle tone. Coordination normal.  Skin: Skin is warm and dry. No rash noted. No erythema. No pallor.  Psychiatric: He has a normal mood and affect.          Assessment & Plan:

## 2013-07-07 NOTE — Assessment & Plan Note (Signed)
Rev low sat fat diet  On pravastatin  Lab today

## 2013-07-07 NOTE — Assessment & Plan Note (Signed)
Due to intermittent prednisone for RA- control has been labile Pt promises to inc exercise  Diet is fair-disc low glycemic parameters  A1C today

## 2013-07-07 NOTE — Assessment & Plan Note (Signed)
Lab Results  Component Value Date   PSA 0.74 09/26/2012   PSA 0.63 04/19/2007   PSA .61 11/27/2005   stable  BPH symptoms and DRE also stable

## 2013-07-07 NOTE — Assessment & Plan Note (Signed)
Reviewed health habits including diet and exercise and skin cancer prevention Reviewed appropriate screening tests for age  Also reviewed health mt list, fam hx and immunization status , as well as social and family history   See HPI Lab today 

## 2013-07-07 NOTE — Progress Notes (Signed)
Pre-visit discussion using our clinic review tool. No additional management support is needed unless otherwise documented below in the visit note.  

## 2013-07-07 NOTE — Assessment & Plan Note (Signed)
Lab today  [place] as long as not on ace/ arb

## 2013-07-07 NOTE — Assessment & Plan Note (Signed)
No change in exam or symptoms and psa stable  Will continue to follow

## 2013-07-10 ENCOUNTER — Telehealth: Payer: Self-pay

## 2013-07-10 NOTE — Telephone Encounter (Signed)
Relevant patient education mailed to patient.  

## 2013-07-16 ENCOUNTER — Telehealth: Payer: Self-pay | Admitting: Family Medicine

## 2013-07-16 NOTE — Telephone Encounter (Signed)
Relevant patient education mailed to patient.  

## 2013-07-17 ENCOUNTER — Encounter: Payer: Self-pay | Admitting: Cardiovascular Disease

## 2013-07-17 ENCOUNTER — Other Ambulatory Visit: Payer: Self-pay | Admitting: *Deleted

## 2013-07-17 MED ORDER — CLOPIDOGREL BISULFATE 75 MG PO TABS: ORAL_TABLET | ORAL | Status: DC

## 2013-07-21 ENCOUNTER — Ambulatory Visit: Payer: Medicare Other | Admitting: Internal Medicine

## 2013-07-25 NOTE — Telephone Encounter (Signed)
Error

## 2013-07-30 ENCOUNTER — Ambulatory Visit (INDEPENDENT_AMBULATORY_CARE_PROVIDER_SITE_OTHER): Payer: Medicare Other | Admitting: Internal Medicine

## 2013-07-30 ENCOUNTER — Telehealth: Payer: Self-pay | Admitting: *Deleted

## 2013-07-30 ENCOUNTER — Encounter: Payer: Self-pay | Admitting: Internal Medicine

## 2013-07-30 VITALS — BP 124/78 | HR 72 | Ht 68.0 in | Wt 180.0 lb

## 2013-07-30 DIAGNOSIS — E119 Type 2 diabetes mellitus without complications: Secondary | ICD-10-CM

## 2013-07-30 DIAGNOSIS — K219 Gastro-esophageal reflux disease without esophagitis: Secondary | ICD-10-CM

## 2013-07-30 DIAGNOSIS — Z860100 Personal history of colon polyps, unspecified: Secondary | ICD-10-CM

## 2013-07-30 DIAGNOSIS — K5520 Angiodysplasia of colon without hemorrhage: Secondary | ICD-10-CM

## 2013-07-30 DIAGNOSIS — I2510 Atherosclerotic heart disease of native coronary artery without angina pectoris: Secondary | ICD-10-CM

## 2013-07-30 DIAGNOSIS — Q2733 Arteriovenous malformation of digestive system vessel: Secondary | ICD-10-CM

## 2013-07-30 MED ORDER — MOVIPREP 100 G PO SOLR: 1.0000 | Freq: Once | ORAL | Status: DC

## 2013-07-30 NOTE — Telephone Encounter (Signed)
07/30/2013   RE: Curtis Frazier DOB: 1940/08/10 MRN: [id number]  Dear Dr Johnsie Cancel,   We have scheduled the above patient for a colonoscopy. Our records show that he is on anticoagulation therapy.  Please advise if you are okay with patient holding his therapy of Plavix 5 days prior to the procedure, which is scheduled for 08/19/13. We will have him to continue his ASA. Please route your response to Dixon Boos, CMA  Sincerely, Dixon Boos

## 2013-07-30 NOTE — Telephone Encounter (Signed)
Ok to stop plavix for colonoscopy

## 2013-07-30 NOTE — Telephone Encounter (Signed)
I advised patient (he was still in the office when Dr Johnsie Cancel responded) that Dr Johnsie Cancel is okay with him holding Plavix for 5 days prior to his colonoscopy procedure. Patient verbalizes understanding.

## 2013-07-30 NOTE — Progress Notes (Signed)
HISTORY OF PRESENT ILLNESS:  Curtis Frazier is a 73 y.o. male with multiple significant medical problems as listed below. 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 He presents today regarding surveillance colonoscopy. The patient underwent initial colonoscopy in 2004 with hyperplastic polyp. Repeat colonoscopy in 2009 with 3 tubular adenomas. He subsequently underwent colonoscopy and upper endoscopy in the hospital, December 2010, for GI bleeding and anemia. Upper endoscopy revealed moderate gastritis. Colonoscopy revealed 6 mm cecal AVM which was ablated with APC. Patient is on chronic Plavix therapy since his myocardial infarction and coronary artery stent placement in 2010. He has come off Plavix previously for surgery. He also takes metformin for diabetes and omeprazole for GERD. GI review of systems is currently unremarkable. He has had some mild weight gain.  REVIEW OF SYSTEMS:  All non-GI ROS negative except for sinus and allergy, arthritis, back pain, visual change, irregular heartbeat, night sweats, skin rash, increased thirst  Past Medical History  Diagnosis Date  . Other and unspecified hyperlipidemia   . Coronary atherosclerosis of unspecified type of vessel, native or graft   . Personal history of colonic polyps   . Actinic keratosis   . Psoriatic arthropathy   . Other psoriasis   . Scoliosis (and kyphoscoliosis), idiopathic   . GI bleed 12/10    cecam AVM  . Gastritis 12/10  . Hyperlipidemia     takes Pravastatin daily  . Hypertension     takes Metoprolol daily  . Myocardial infarction 2010  . Shortness of breath     with exertion  . Pneumonia     hx of-2008  . Headache(784.0)     occasionally  . Joint pain   . Joint swelling   . Rheumatoid arthritis(714.0)     takes Methotrexate 7pills weekly  . Degeneration of intervertebral disc, site unspecified   . Chronic back pain    scoliosis/stenosis/radiculopathy,degenerative disc disease  . Psoriasis   . Bruises easily   . Esophageal reflux     takes Omeprazole daily  . History of colonic polyps   . Hemorrhoids   . Kidney stone     hx of  . Blood transfusion 2010  . Type II or unspecified type diabetes mellitus without mention of complication, not stated as uncontrolled     type II;controlled by diet and exercise  . PMR (polymyalgia rheumatica) 01/20/2012    tx by specialist on a long prednisone taper    Past Surgical History  Procedure Laterality Date  . Rotator cuff repair  2000    left   . Colonoscopy    . Vasectomy  1979  . Ptca stent  02/2009    has one stent  . Carotid doppler  10/12    0-39% R and 60-79% left   . Cataract surgery  2012  . Cardiac catheterization  2010  . Lithotripsy  2009    Social History Curtis Frazier  reports that he has quit smoking. He has never used smokeless tobacco. He reports that he drinks alcohol. He reports that he does not use illicit drugs.  family history includes Cancer in his brother; Coronary artery disease in his mother and sister; Diabetes in his brother, brother, and mother; Heart disease in his mother and sister; Hypertension in his sister; Kidney failure in his sister; Pancreatic cancer in his brother; Prostate cancer in his brother. There is no history of Anesthesia problems, Hypotension,  Malignant hyperthermia, or Pseudochol deficiency.  Allergies  Allergen Reactions  . Ace Inhibitors     Causes high K  . Angiotensin Receptor Blockers     Causes high K  . Penicillins Swelling and Rash  . Tetracycline Swelling and Rash       PHYSICAL EXAMINATION: Vital signs: BP 124/78  Pulse 72  Ht 5\' 8"  (1.727 m)  Wt 180 lb (81.647 kg)  BMI 27.38 kg/m2  Constitutional: generally well-appearing, no acute distress Psychiatric: alert and oriented x3, cooperative Eyes: extraocular movements intact, anicteric, conjunctiva pink Mouth: oral pharynx moist, no  lesions Neck: supple no lymphadenopathy Cardiovascular: heart regular rate and rhythm, no murmur Lungs: clear to auscultation bilaterally Abdomen: soft, nontender, nondistended, no obvious ascites, no peritoneal signs, normal bowel sounds, no organomegaly Rectal: Deferred until colonoscopy Extremities: no lower extremity edema bilaterally Skin: no lesions on visible extremities Neuro: No focal deficits.   ASSESSMENT:  #1. History of multiple adenomas 2009. Due for surveillance. #2. History of anemia and GI bleeding secondary to AVMs (colonic) Status post ablation 2010 #3. GERD. Asymptomatic on PPI #4. Multiple medical problems. Stable. History of CAD with coronary artery stent on aspirin/ Plavix #5. Diabetes mellitus. Oral agents  PLAN:  #1. Colonoscopy. The patient is an appropriate candidate without contraindication. However, high risk given his comorbidities and the need to adjust medications.The nature of the procedure, as well as the risks, benefits, and alternatives were carefully and thoroughly reviewed with the patient. Ample time for discussion and questions allowed. The patient understood, was satisfied, and agreed to proceed. #2. Check with Dr. Johnsie Cancel to see if is OK to hold Plavix 5 days prior to his colonoscopy. We will continue aspirin #3. Hold diabetic medications the day of the procedure to avoid and wanted hypoglycemia #4. Continue PPI

## 2013-07-30 NOTE — Patient Instructions (Addendum)
You have been scheduled for a colonoscopy with propofol. Please follow written instructions given to you at your visit today.  Please pick up your prep kit at the pharmacy within the next 1-3 days. If you use inhalers (even only as needed), please bring them with you on the day of your procedure. Your physician has requested that you go to www.startemmi.com and enter the access code given to you at your visit today. This web site gives a general overview about your procedure. However, you should still follow specific instructions given to you by our office regarding your preparation for the procedure.  You will be contacted by our office prior to your procedure for directions on holding your Plavix.  If you do not hear from our office 1 week prior to your scheduled procedure, please call [phone] to discuss.   Please continue your aspirin for your procedure.  Hold your Metformin on the day of your procedure.

## 2013-08-19 ENCOUNTER — Encounter: Payer: Self-pay | Admitting: Internal Medicine

## 2013-08-19 ENCOUNTER — Ambulatory Visit (AMBULATORY_SURGERY_CENTER): Payer: Medicare Other | Admitting: Internal Medicine

## 2013-08-19 VITALS — BP 111/67 | HR 73 | Temp 98.0°F | Resp 16 | Ht 68.0 in | Wt 180.0 lb

## 2013-08-19 DIAGNOSIS — D126 Benign neoplasm of colon, unspecified: Secondary | ICD-10-CM

## 2013-08-19 DIAGNOSIS — Z86010 Personal history of colonic polyps: Secondary | ICD-10-CM

## 2013-08-19 LAB — GLUCOSE, CAPILLARY
GLUCOSE-CAPILLARY: 102 mg/dL — AB (ref 70–99)
Glucose-Capillary: 96 mg/dL (ref 70–99)

## 2013-08-19 MED ORDER — SODIUM CHLORIDE 0.9 % IV SOLN: 500.0000 mL | INTRAVENOUS | Status: DC

## 2013-08-19 NOTE — Progress Notes (Signed)
Report to pacu rn, vss, bbs=clear 

## 2013-08-19 NOTE — Progress Notes (Signed)
Called to room to assist during endoscopic procedure.  Patient ID and intended procedure confirmed with present staff. Received instructions for my participation in the procedure from the performing physician.  

## 2013-08-19 NOTE — Op Note (Signed)
[facility]  [address] [place], [id number]   COLONOSCOPY PROCEDURE REPORT  PATIENT: Curtis Frazier, Curtis Frazier  MR#: [id number] BIRTHDATE: 03-May-1941 , 73  yrs. old GENDER: Male ENDOSCOPIST: Eustace Quail, MD REFERRED [id number] Program Recall PROCEDURE DATE:  08/19/2013 PROCEDURE:   Colonoscopy with snare polypectomy x4 First Screening Colonoscopy - Avg.  risk and is 50 yrs.  old or older - No.  Prior Negative Screening - Now for repeat screening. N/A  History of Adenoma - Now for follow-up colonoscopy & has been > or = to 3 yrs.  Yes hx of adenoma.  Has been 3 or more years since last colonoscopy.  Polyps Removed Today? Yes. ASA CLASS:   Class III INDICATIONS:Patient's personal history of adenomatous colon polyps. Index 2004 (hpp); 2009 (tubular adenoma x3) MEDICATIONS: MAC sedation, administered by CRNA and propofol (Diprivan) 300mg  IV  DESCRIPTION OF PROCEDURE:   After the risks benefits and alternatives of the procedure were thoroughly explained, informed consent was obtained.  A digital rectal exam revealed no abnormalities of the rectum.   The LB [id number]  endoscope was introduced through the anus and advanced to the cecum, which was identified by both the appendix and ileocecal valve. No adverse events experienced.   The quality of the prep was excellent, using MoviPrep  The instrument was then slowly withdrawn as the colon was fully examined.  COLON FINDINGS: Four diminutive polyps were found at the cecum, in the ascending colon, transverse colon, and sigmoid colon.  A polypectomy was performed with a cold snare.  The resection was complete and the polyp tissue was completely retrieved.   The colon mucosa was otherwise normal.  Retroflexed views revealed internal hemorrhoids. The time to cecum=4 minutes 07 seconds.  Withdrawal time=11 minutes 44 seconds.  The scope was withdrawn and the procedure completed. COMPLICATIONS: There were no  complications.  ENDOSCOPIC IMPRESSION: 1.   Four diminutive polyps were found in the colon; polypectomy was performed with a cold snare 2.   The colon mucosa was otherwise normal  RECOMMENDATIONS: 1.Follow up colonoscopy in 5 years   eSigned:  Eustace Quail, MD 08/19/2013 3:38 PM   cc: The Patient and Abner Greenspan, MD

## 2013-08-19 NOTE — Patient Instructions (Signed)
YOU HAD AN ENDOSCOPIC PROCEDURE TODAY AT THE [facility]: Refer to the procedure report that was given to you for any specific questions about what was found during the examination.  If the procedure report does not answer your questions, please call your gastroenterologist to clarify.  If you requested that your care partner not be given the details of your procedure findings, then the procedure report has been included in a sealed envelope for you to review at your convenience later.  YOU SHOULD EXPECT: Some feelings of bloating in the abdomen. Passage of more gas than usual.  Walking can help get rid of the air that was put into your GI tract during the procedure and reduce the bloating. If you had a lower endoscopy (such as a colonoscopy or flexible sigmoidoscopy) you may notice spotting of blood in your stool or on the toilet paper. If you underwent a bowel prep for your procedure, then you may not have a normal bowel movement for a few days.  DIET: Your first meal following the procedure should be a light meal and then it is ok to progress to your normal diet.  A half-sandwich or bowl of soup is an example of a good first meal.  Heavy or fried foods are harder to digest and may make you feel nauseous or bloated.  Likewise meals heavy in dairy and vegetables can cause extra gas to form and this can also increase the bloating.  Drink plenty of fluids but you should avoid alcoholic beverages for 24 hours.  ACTIVITY: Your care partner should take you home directly after the procedure.  You should plan to take it easy, moving slowly for the rest of the day.  You can resume normal activity the day after the procedure however you should NOT DRIVE or use heavy machinery for 24 hours (because of the sedation medicines used during the test).    SYMPTOMS TO REPORT IMMEDIATELY: A gastroenterologist can be reached at any hour.  During normal business hours, 8:30 AM to 5:00 PM Monday through Friday,  call [phone].  After hours and on weekends, please call the GI answering service at [phone] who will take a message and have the physician on call contact you.   Following lower endoscopy (colonoscopy or flexible sigmoidoscopy):  Excessive amounts of blood in the stool  Significant tenderness or worsening of abdominal pains  Swelling of the abdomen that is new, acute  Fever of 100F or higher  FOLLOW UP: If any biopsies were taken you will be contacted by phone or by letter within the next 1-3 weeks.  Call your gastroenterologist if you have not heard about the biopsies in 3 weeks.  Our staff will call the home number listed on your records the next business day following your procedure to check on you and address any questions or concerns that you may have at that time regarding the information given to you following your procedure. This is a courtesy call and so if there is no answer at the home number and we have not heard from you through the emergency physician on call, we will assume that you have returned to your regular daily activities without incident.  SIGNATURES/CONFIDENTIALITY: You and/or your care partner have signed paperwork which will be entered into your electronic medical record.  These signatures attest to the fact that that the information above on your After Visit Summary has been reviewed and is understood.  Full responsibility of the confidentiality of this   discharge information lies with you and/or your care-partner.  TAKE YOUR PLAVIX TODAY PER DR PERRY.

## 2013-08-20 ENCOUNTER — Telehealth: Payer: Self-pay | Admitting: *Deleted

## 2013-08-20 NOTE — Telephone Encounter (Signed)
  Follow up Call-  Call back number 08/19/2013  Post procedure Call Back phone  # [phone]  Permission to leave phone message Yes     Patient questions:  Do you have a fever, pain , or abdominal swelling? no Pain Score  0 *  Have you tolerated food without any problems? yes  Have you been able to return to your normal activities? yes  Do you have any questions about your discharge instructions: Diet   no Medications  no Follow up visit  no  Do you have questions or concerns about your Care? no  Actions: * If pain score is 4 or above: No action needed, pain <4.

## 2013-08-25 ENCOUNTER — Encounter: Payer: Self-pay | Admitting: Internal Medicine

## 2013-09-04 ENCOUNTER — Other Ambulatory Visit: Payer: Self-pay | Admitting: *Deleted

## 2013-09-04 ENCOUNTER — Encounter: Payer: Self-pay | Admitting: *Deleted

## 2013-09-05 ENCOUNTER — Ambulatory Visit (INDEPENDENT_AMBULATORY_CARE_PROVIDER_SITE_OTHER): Payer: Medicare Other | Admitting: Cardiovascular Disease

## 2013-09-05 ENCOUNTER — Encounter: Payer: Self-pay | Admitting: Cardiovascular Disease

## 2013-09-05 VITALS — BP 145/90 | HR 63 | Ht 68.0 in | Wt 179.8 lb

## 2013-09-05 DIAGNOSIS — I350 Nonrheumatic aortic (valve) stenosis: Secondary | ICD-10-CM

## 2013-09-05 DIAGNOSIS — I10 Essential (primary) hypertension: Secondary | ICD-10-CM

## 2013-09-05 DIAGNOSIS — E119 Type 2 diabetes mellitus without complications: Secondary | ICD-10-CM

## 2013-09-05 DIAGNOSIS — I359 Nonrheumatic aortic valve disorder, unspecified: Secondary | ICD-10-CM

## 2013-09-05 DIAGNOSIS — L4050 Arthropathic psoriasis, unspecified: Secondary | ICD-10-CM

## 2013-09-05 DIAGNOSIS — I2510 Atherosclerotic heart disease of native coronary artery without angina pectoris: Secondary | ICD-10-CM

## 2013-09-05 NOTE — Assessment & Plan Note (Signed)
2013 mean gradient only 14 mmHg but valve heavily calcified Will check echo next visit Not severe on exam

## 2013-09-05 NOTE — Progress Notes (Signed)
Patient ID: Curtis Frazier, male   DOB: [birth year], 73 y.o.   MRN: [id number] Curtis Frazier is seen today For F/U of CAD, carotid disease. He has a BMS in his circ from [place] in 02/2009. GI bleed last year from AVM. He had a normal myovue in January 2011 with no ischemia and normal LV 1/13 had lumbar surgery with PAF in setting of post op anemia and pain. Converted with amiodarone and continues on only beta blocker now On prednisone taper for PMR This has improved his hip and knee pain.   No TIA symptoms. Continue antiplatelet Rx    Low risk myovue 12/14 Overall Impression: This is a low risk scan. There is excellent wall motion. I cannot rule out the possibility of slight ischemia at the apical cap, but this is always a borderline call. There are no large areas of scar or ischemia.  LV Ejection Fraction: 71%. LV Wall Motion: Normal Wall Motion.  33/00 LICA 76-22%  Needs f/u duplex 5/15  BP running high today  Compliant with meds Needs BP monitor at home  Allergic to ARB/ACE  Starting remicade for psoriatic arthritis  Trying to taper prednisone and methotrexate     ROS: Denies fever, malais, weight loss, blurry vision, decreased visual acuity, cough, sputum, SOB, hemoptysis, pleuritic pain, palpitaitons, heartburn, abdominal pain, melena, lower extremity edema, claudication, or rash.  All other systems reviewed and negative  General: Affect appropriate Healthy:  appears stated age 51: normal Neck supple with no adenopathy JVP normal bilateral bruits no thyromegaly Lungs clear with no wheezing and good diaphragmatic motion Heart:  S1/S2 SEM  murmur, no rub, gallop or click PMI normal Abdomen: benighn, BS positve, no tenderness, no AAA no bruit.  No HSM or HJR Distal pulses intact with no bruits No edema Neuro non-focal Skin warm and dry No muscular weakness   Current Outpatient Prescriptions  Medication Sig Dispense Refill  . aspirin EC 81 MG tablet Take 81 mg by mouth daily.      .  Cholecalciferol 1000 UNITS capsule Take 1,000 Units by mouth daily.        . clopidogrel (PLAVIX) 75 MG tablet TAKE 1 TABLET (75 MG TOTAL) BY MOUTH DAILY.  30 tablet  2  . fish oil-omega-3 fatty acids 1000 MG capsule Take 1 g by mouth daily.       Marland Kitchen FOLIC ACID PO Take 1 tablet by mouth daily.       Marland Kitchen glucose blood test strip To check blood sugar for poorly controlled diabetes twice daily and as needed. Dx: 250.00  100 each  3  . HYDROcodone-homatropine (HYCODAN) 5-1.5 MG/5ML syrup Take 5 mLs by mouth every 8 (eight) hours as needed for cough.  120 mL  0  . metFORMIN (GLUCOPHAGE) 1000 MG tablet Take 1 tablet (1,000 mg total) by mouth 2 (two) times daily with a meal.  180 tablet  1  . methotrexate (RHEUMATREX) 2.5 MG tablet Take by mouth once a week. 7 TABS WEEKLY ./CY      . metoprolol succinate (TOPROL-XL) 25 MG 24 hr tablet TAKE 1 TABLET (25 MG TOTAL) BY MOUTH DAILY.  90 tablet  0  . nitroGLYCERIN (NITROSTAT) 0.4 MG SL tablet Place 1 tablet (0.4 mg total) under the tongue every 5 (five) minutes as needed. For chest pain.  25 tablet  12  . omeprazole (PRILOSEC) 40 MG capsule Take 40 mg by mouth daily.        . pravastatin (PRAVACHOL) 20 MG tablet  Take 1 tablet (20 mg total) by mouth daily.  30 tablet  11   No current facility-administered medications for this visit.    Allergies  Ace inhibitors; Angiotensin receptor blockers; Penicillins; and Tetracycline  Electrocardiogram:  SR LAE  Otherwise normal   Assessment and Plan

## 2013-09-05 NOTE — Assessment & Plan Note (Signed)
May improve as prednisone is tapered He will get BP cuff and monitor F/U next available to see if needs new meds Continue beta blocker

## 2013-09-05 NOTE — Assessment & Plan Note (Signed)
Discussed low carb diet.  Target hemoglobin A1c is 6.5 or less.  Continue current medications.  

## 2013-09-05 NOTE — Patient Instructions (Addendum)
Your physician recommends that you schedule a follow-up appointment in: [place]  Your physician recommends that you continue on your current medications as directed. Please refer to the Current Medication list given to you today.  Your physician has requested that you regularly monitor and record your blood pressure readings at home. Please use the same machine at the same time of day to check your readings and record them to bring to your follow-up visit.  Your physician has requested that you have an echocardiogram. Echocardiography is a painless test that uses sound waves to create images of your heart. It provides your doctor with information about the size and shape of your heart and how well your heart's chambers and valves are working. This procedure takes approximately one hour. There are no restrictions for this procedure.

## 2013-09-05 NOTE — Assessment & Plan Note (Signed)
Stable with no angina and good activity level.  Continue medical Rx  

## 2013-09-05 NOTE — Assessment & Plan Note (Signed)
F/U [place]  Continue remicade and taper methotrexate and prednisone Primarily affecting wrist and hands

## 2013-09-27 ENCOUNTER — Other Ambulatory Visit: Payer: Self-pay | Admitting: Family Medicine

## 2013-09-29 ENCOUNTER — Other Ambulatory Visit: Payer: Self-pay | Admitting: *Deleted

## 2013-09-29 MED ORDER — METFORMIN HCL 1000 MG PO TABS: 1000.0000 mg | ORAL_TABLET | Freq: Two times a day (BID) | ORAL | Status: DC

## 2013-10-03 ENCOUNTER — Other Ambulatory Visit (HOSPITAL_COMMUNITY): Payer: Medicare Other

## 2013-10-03 ENCOUNTER — Ambulatory Visit (INDEPENDENT_AMBULATORY_CARE_PROVIDER_SITE_OTHER): Payer: Medicare Other | Admitting: Cardiovascular Disease

## 2013-10-03 ENCOUNTER — Ambulatory Visit: Payer: Medicare Other | Admitting: Cardiovascular Disease

## 2013-10-03 ENCOUNTER — Encounter: Payer: Self-pay | Admitting: Cardiovascular Disease

## 2013-10-03 ENCOUNTER — Ambulatory Visit (HOSPITAL_COMMUNITY): Payer: Medicare Other | Attending: Cardiovascular Disease | Admitting: Cardiology

## 2013-10-03 VITALS — BP 156/80 | HR 64 | Ht 68.0 in | Wt 180.0 lb

## 2013-10-03 DIAGNOSIS — I350 Nonrheumatic aortic (valve) stenosis: Secondary | ICD-10-CM

## 2013-10-03 DIAGNOSIS — I10 Essential (primary) hypertension: Secondary | ICD-10-CM

## 2013-10-03 DIAGNOSIS — I359 Nonrheumatic aortic valve disorder, unspecified: Secondary | ICD-10-CM

## 2013-10-03 DIAGNOSIS — I2510 Atherosclerotic heart disease of native coronary artery without angina pectoris: Secondary | ICD-10-CM

## 2013-10-03 DIAGNOSIS — E119 Type 2 diabetes mellitus without complications: Secondary | ICD-10-CM

## 2013-10-03 NOTE — Assessment & Plan Note (Signed)
Stable with no angina and good activity level.  Continue medical Rx Normal myovue 12/14

## 2013-10-03 NOTE — Patient Instructions (Signed)
Your physician wants you to follow-up in:  6 MONTHS WITH DR NISHAN  You will receive a reminder letter in the mail two months in advance. If you don't receive a letter, please call our office to schedule the follow-up appointment. Your physician recommends that you continue on your current medications as directed. Please refer to the Current Medication list given to you today. 

## 2013-10-03 NOTE — Assessment & Plan Note (Signed)
Discussed low carb diet.  Target hemoglobin A1c is 6.5 or less.  Continue current medications.  

## 2013-10-03 NOTE — Assessment & Plan Note (Signed)
No change in gradient mild to moderate with normal EF  F/U echo in a year

## 2013-10-03 NOTE — Progress Notes (Signed)
Patient ID: Curtis Frazier, male   DOB: [birth year], 73 y.o.   MRN: [id number]  Curtis Frazier is seen today For F/U of CAD, carotid disease. He has a BMS in his circ from [place] in 02/2009. GI bleed last year from AVM. He had a normal myovue in January 2011 with no ischemia and normal LV 1/13 had lumbar surgery with PAF in setting of post op anemia and pain. Converted with amiodarone and continues on only beta blocker now On prednisone taper for PMR This has improved his hip and knee pain. No TIA symptoms. Continue antiplatelet Rx  Low risk myovue 12/14  Overall Impression: This is a low risk scan. There is excellent wall motion. I cannot rule out the possibility of slight ischemia at the apical cap, but this is always a borderline call. There are no large areas of scar or ischemia.  LV Ejection Fraction: 71%. LV Wall Motion: Normal Wall Motion.  16/07 LICA 37-10% Needs f/u duplex 5/15   Allergic to ARB/ACE  Starting remicade for psoriatic arthritis Trying to taper prednisone and methotrexate   Has AS with heavily calcified valve Reviewed echo today and still mild AS despite morphology with mean gradient going from only 14 to 18 mmHg     ROS: Denies fever, malais, weight loss, blurry vision, decreased visual acuity, cough, sputum, SOB, hemoptysis, pleuritic pain, palpitaitons, heartburn, abdominal pain, melena, lower extremity edema, claudication, or rash.  All other systems reviewed and negative  General: Affect appropriate Healthy:  appears stated age 53: normal Neck supple with no adenopathy JVP normal no bruits no thyromegaly Lungs clear with no wheezing and good diaphragmatic motion Heart:  S1/S2 preserved AS  murmur, no rub, gallop or click PMI normal Abdomen: benighn, BS positve, no tenderness, no AAA no bruit.  No HSM or HJR Distal pulses intact with no bruits No edema Neuro non-focal Skin warm and dry No muscular weakness   Current Outpatient Prescriptions  Medication Sig Dispense  Refill  . aspirin EC 81 MG tablet Take 81 mg by mouth daily.      . Cholecalciferol 1000 UNITS capsule Take 1,000 Units by mouth daily.        . clopidogrel (PLAVIX) 75 MG tablet TAKE 1 TABLET (75 MG TOTAL) BY MOUTH DAILY.  30 tablet  2  . fish oil-omega-3 fatty acids 1000 MG capsule Take 1 g by mouth daily.       Marland Kitchen FOLIC ACID PO Take 1 tablet by mouth daily.       Marland Kitchen glucose blood test strip To check blood sugar for poorly controlled diabetes twice daily and as needed. Dx: 250.00  100 each  3  . metFORMIN (GLUCOPHAGE) 1000 MG tablet Take 1 tablet (1,000 mg total) by mouth 2 (two) times daily with a meal.  180 tablet  0  . methotrexate (RHEUMATREX) 2.5 MG tablet Take by mouth once a week. 7 TABS WEEKLY ./CY      . metoprolol succinate (TOPROL-XL) 25 MG 24 hr tablet TAKE 1 TABLET (25 MG TOTAL) BY MOUTH DAILY.  90 tablet  1  . nitroGLYCERIN (NITROSTAT) 0.4 MG SL tablet Place 1 tablet (0.4 mg total) under the tongue every 5 (five) minutes as needed. For chest pain.  25 tablet  12  . omeprazole (PRILOSEC) 40 MG capsule Take 40 mg by mouth daily.        . pravastatin (PRAVACHOL) 20 MG tablet Take 1 tablet (20 mg total) by mouth daily.  30 tablet  11  .  predniSONE (DELTASONE) 5 MG tablet        No current facility-administered medications for this visit.    Allergies  Ace inhibitors; Angiotensin receptor blockers; Penicillins; and Tetracycline  Electrocardiogram: 05/23/13 SR normal minor flattening of ST segments done with stress test   Assessment and Plan

## 2013-10-03 NOTE — Progress Notes (Signed)
Echo performed. 

## 2013-10-03 NOTE — Assessment & Plan Note (Signed)
Well controlled.  Continue current medications and low sodium Dash type diet.    

## 2013-10-23 ENCOUNTER — Other Ambulatory Visit: Payer: Self-pay | Admitting: Family Medicine

## 2013-11-07 ENCOUNTER — Other Ambulatory Visit (INDEPENDENT_AMBULATORY_CARE_PROVIDER_SITE_OTHER): Payer: Medicare Other

## 2013-11-07 DIAGNOSIS — D649 Anemia, unspecified: Secondary | ICD-10-CM

## 2013-11-07 DIAGNOSIS — R946 Abnormal results of thyroid function studies: Secondary | ICD-10-CM

## 2013-11-07 DIAGNOSIS — E875 Hyperkalemia: Secondary | ICD-10-CM

## 2013-11-07 DIAGNOSIS — R7989 Other specified abnormal findings of blood chemistry: Secondary | ICD-10-CM

## 2013-11-07 DIAGNOSIS — I10 Essential (primary) hypertension: Secondary | ICD-10-CM

## 2013-11-07 DIAGNOSIS — E785 Hyperlipidemia, unspecified: Secondary | ICD-10-CM

## 2013-11-07 DIAGNOSIS — E119 Type 2 diabetes mellitus without complications: Secondary | ICD-10-CM

## 2013-11-07 LAB — COMPREHENSIVE METABOLIC PANEL
ALBUMIN: 3.5 g/dL (ref 3.5–5.2)
ALK PHOS: 53 U/L (ref 39–117)
ALT: 14 U/L (ref 0–53)
AST: 19 U/L (ref 0–37)
BUN: 18 mg/dL (ref 6–23)
CO2: 27 mEq/L (ref 19–32)
Calcium: 8.8 mg/dL (ref 8.4–10.5)
Chloride: 111 mEq/L (ref 96–112)
Creatinine, Ser: 1.5 mg/dL (ref 0.4–1.5)
GFR: 49.34 mL/min — ABNORMAL LOW (ref >60.00)
Glucose, Bld: 121 mg/dL — ABNORMAL HIGH (ref 70–99)
POTASSIUM: 4.8 meq/L (ref 3.5–5.1)
SODIUM: 141 meq/L (ref 135–145)
TOTAL PROTEIN: 6.3 g/dL (ref 6.0–8.3)
Total Bilirubin: 0.6 mg/dL (ref 0.2–1.2)

## 2013-11-07 LAB — CBC WITH DIFFERENTIAL/PLATELET
BASOS ABS: 0 10*3/uL (ref 0.0–0.1)
Basophils Relative: 0.4 % (ref 0.0–3.0)
EOS ABS: 0 10*3/uL (ref 0.0–0.7)
Eosinophils Relative: 0.7 % (ref 0.0–5.0)
HEMATOCRIT: 32.2 % — AB (ref 39.0–52.0)
Hemoglobin: 10.5 g/dL — ABNORMAL LOW (ref 13.0–17.0)
LYMPHS ABS: 2.3 10*3/uL (ref 0.7–4.0)
Lymphocytes Relative: 35.7 % (ref 12.0–46.0)
MCHC: 32.5 g/dL (ref 30.0–36.0)
MCV: 96.8 fl (ref 78.0–100.0)
MONO ABS: 0.7 10*3/uL (ref 0.1–1.0)
Monocytes Relative: 11 % (ref 3.0–12.0)
NEUTROS PCT: 52.2 % (ref 43.0–77.0)
Neutro Abs: 3.4 10*3/uL (ref 1.4–7.7)
PLATELETS: 239 10*3/uL (ref 150.0–400.0)
RBC: 3.33 Mil/uL — ABNORMAL LOW (ref 4.22–5.81)
RDW: 16.9 % — AB (ref 11.5–15.5)
WBC: 6.6 10*3/uL (ref 4.0–10.5)

## 2013-11-07 LAB — LIPID PANEL
CHOLESTEROL: 181 mg/dL (ref 0–200)
HDL: 39.8 mg/dL (ref >39.00)
LDL Cholesterol: 124 mg/dL — ABNORMAL HIGH (ref 0–99)
Total CHOL/HDL Ratio: 5
Triglycerides: 86 mg/dL (ref 0.0–149.0)
VLDL: 17.2 mg/dL (ref 0.0–40.0)

## 2013-11-07 LAB — TSH: TSH: 0.51 u[IU]/mL (ref 0.35–4.50)

## 2013-11-07 LAB — HEMOGLOBIN A1C: HEMOGLOBIN A1C: 7.2 % — AB (ref 4.6–6.5)

## 2013-11-07 LAB — MICROALBUMIN / CREATININE URINE RATIO
Creatinine,U: 130.9 mg/dL
Microalb Creat Ratio: 2.5 mg/g (ref 0.0–30.0)
Microalb, Ur: 3.3 mg/dL — ABNORMAL HIGH (ref 0.0–1.9)

## 2013-11-07 NOTE — Addendum Note (Signed)
Addended by: Daralene Milch C on: 11/07/2013 11:14 AM   Modules accepted: Orders

## 2013-11-14 ENCOUNTER — Ambulatory Visit (INDEPENDENT_AMBULATORY_CARE_PROVIDER_SITE_OTHER): Payer: Medicare Other | Admitting: Family Medicine

## 2013-11-14 ENCOUNTER — Encounter: Payer: Self-pay | Admitting: Family Medicine

## 2013-11-14 VITALS — BP 114/62 | HR 63 | Temp 98.1°F | Wt 179.5 lb

## 2013-11-14 DIAGNOSIS — E119 Type 2 diabetes mellitus without complications: Secondary | ICD-10-CM

## 2013-11-14 DIAGNOSIS — N289 Disorder of kidney and ureter, unspecified: Secondary | ICD-10-CM

## 2013-11-14 DIAGNOSIS — I10 Essential (primary) hypertension: Secondary | ICD-10-CM

## 2013-11-14 DIAGNOSIS — E785 Hyperlipidemia, unspecified: Secondary | ICD-10-CM

## 2013-11-14 DIAGNOSIS — I2510 Atherosclerotic heart disease of native coronary artery without angina pectoris: Secondary | ICD-10-CM

## 2013-11-14 DIAGNOSIS — D649 Anemia, unspecified: Secondary | ICD-10-CM

## 2013-11-14 NOTE — Assessment & Plan Note (Signed)
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

## 2013-11-14 NOTE — Assessment & Plan Note (Signed)
bp in fair control at this time  BP Readings from Last 1 Encounters:  11/14/13 114/62   No changes needed Disc lifstyle change with low sodium diet and exercise  Lab rev  Watching renal fxn

## 2013-11-14 NOTE — Progress Notes (Signed)
Subjective:    Patient ID: Curtis Frazier, male    DOB: 11-18-1940, 73 y.o.   MRN: [id number]  HPI Here for f/u of chronic medical problems   Has been tired lately   Wt is stable with bmi of 27  bp is stable today  His bp was up at cardiology last time - ? Know why  Following his carotids and also echo  No cp or palpitations or headaches or edema  No side effects to medicines  BP Readings from Last 3 Encounters:  11/14/13 114/62  10/03/13 156/80  09/05/13 145/90       Chemistry      Component Value Date/Time   NA 141 11/07/2013 0918   K 4.8 11/07/2013 0918   CL 111 11/07/2013 0918   CO2 27 11/07/2013 0918   BUN 18 11/07/2013 0918   CREATININE 1.5 11/07/2013 0918      Component Value Date/Time   CALCIUM 8.8 11/07/2013 0918   ALKPHOS 53 11/07/2013 0918   AST 19 11/07/2013 0918   ALT 14 11/07/2013 0918   BILITOT 0.6 11/07/2013 0918     microalbumin was improved   He is in the middle of transitioning from mtx and pred to remicade -has had 3 transfusions of it  Will wean off prednisone Will cut down mtx -but not stop     Hyperlipidemia Lab Results  Component Value Date   CHOL 181 11/07/2013   CHOL 189 07/07/2013   CHOL 219* 06/28/2012   Lab Results  Component Value Date   HDL 39.80 11/07/2013   HDL 39.00* 07/07/2013   HDL 37.70* 06/28/2012   Lab Results  Component Value Date   LDLCALC 124* 11/07/2013   LDLCALC 129* 07/07/2013   LDLCALC 131* 11/18/2010   Lab Results  Component Value Date   TRIG 86.0 11/07/2013   TRIG 105.0 07/07/2013   TRIG 149.0 06/28/2012   Lab Results  Component Value Date   CHOLHDL 5 11/07/2013   CHOLHDL 5 07/07/2013   CHOLHDL 6 06/28/2012   Lab Results  Component Value Date   LDLDIRECT 131.3 06/28/2012   LDLDIRECT 149.9 01/19/2012   LDLDIRECT 159.0 04/29/2008   pravastatin and diet  Diet is fair per pt  Has vascular dz   Anemia Lab Results  Component Value Date   WBC 6.6 11/07/2013   HGB 10.5* 11/07/2013   HCT 32.2* 11/07/2013   MCV 96.8  11/07/2013   PLT 239.0 11/07/2013   (Hb is down from 12.2) Tired  He did not tolerate iron po  No blood in stool or abd pain , and no more surgery  Iron - no longer on it (it was better when he was on it)- also better when he eats beets (he thinks it will help)  Had a colonosc 3/15 - few polyps -nothing bleeding   DM Lab Results  Component Value Date   HGBA1C 7.2* 11/07/2013   this is up from 7.0 On metformin  On prednisone alt 5 and 2.5 mg daily  Told he would be off prednisone by then - PMR  His blood sugar is better on the days he takes 1/2 pill than a whole one   Patient Active Problem List   Diagnosis Date Noted  . Renal insufficiency 11/14/2013  . Encounter for Medicare annual wellness exam 07/07/2013  . BPH (benign prostatic hyperplasia) 07/05/2012  . Prostate cancer screening 06/27/2012  . Low TSH level 01/22/2012  . PMR (polymyalgia rheumatica) 01/20/2012  . Aortic stenosis  07/08/2011  . New onset a-fib 07/05/2011    Class: Acute  . HYPERKALEMIA 02/18/2010  . ESSENTIAL HYPERTENSION, BENIGN 09/13/2009  . ANEMIA-UNSPECIFIED 06/10/2009  . ANGIODYSPLASIA-INTESTINE 06/10/2009  . MURMUR 05/21/2009  . CAROTID BRUIT 05/21/2009  . C A D 03/09/2009  . GERD 03/09/2009  . ARTHRITIS, RHEUMATOID 04/29/2008  . HX, PERSONAL, COLONIC POLYPS 01/17/2007  . DIABETES MELLITUS, TYPE II 09/18/2006  . HYPERLIPIDEMIA 09/18/2006  . PSORIATIC ARTHRITIS 09/18/2006  . PSORIASIS 09/18/2006  . 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 DEGENERATIVE DISC DISEASE 09/18/2006  . SCOLIOSIS 09/18/2006  . TOBACCO ABUSE, HX OF 09/18/2006   Past Medical History  Diagnosis Date  . Other and unspecified hyperlipidemia   . Coronary atherosclerosis of unspecified type of vessel, native or graft   . Personal history of colonic polyps   . Actinic keratosis   . Psoriatic arthropathy   . Other psoriasis   . Scoliosis (and kyphoscoliosis), idiopathic   . GI bleed 12/10    cecam AVM  . Gastritis 12/10  .  Hyperlipidemia     takes Pravastatin daily  . Hypertension     takes Metoprolol daily  . Myocardial infarction 2010  . Shortness of breath     with exertion  . Pneumonia     hx of-2008  . Headache(784.0)     occasionally  . Joint pain   . Joint swelling   . Rheumatoid arthritis(714.0)     takes Methotrexate 7pills weekly  . Degeneration of intervertebral disc, site unspecified   . Chronic back pain     scoliosis/stenosis/radiculopathy,degenerative disc disease  . Psoriasis   . Bruises easily   . Esophageal reflux     takes Omeprazole daily  . History of colonic polyps   . Hemorrhoids   . Kidney stone     hx of  . Blood transfusion 2010  . Type II or unspecified type diabetes mellitus without mention of complication, not stated as uncontrolled     type II;controlled by diet and exercise  . 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 S/P PTCA (percutaneous transluminal coronary angioplasty)    Past Surgical History  Procedure Laterality Date  . Rotator cuff repair Left 2000  . Colonoscopy    . Vasectomy  1979  . Coronary angioplasty with stent placement  02/2009    has one stent  . Carotid doppler  10/12    0-39% R and 60-79% left   . Cataract extraction  2012  . Cardiac catheterization  2010  . Lithotripsy  2009   History  Substance Use Topics  . Smoking status: Former Research scientist (life sciences)  . Smokeless tobacco: Never Used     Comment: quit 2010  . Alcohol Use: Yes     Comment: occasional   Family History  Problem Relation Age of Onset  . Coronary artery disease Mother   . Diabetes Mother   . CAD Mother   . Coronary artery disease Sister   . Hypertension Sister   . Kidney failure Sister   . CAD Sister   . Pancreatic cancer Brother   . Diabetes Brother   . Prostate cancer Brother   . Pancreatic cancer Brother   . Diabetes Brother   . Anesthesia problems Neg Hx   . Hypotension Neg Hx   . Malignant hyperthermia Neg Hx     . Pseudochol deficiency Neg Hx    Allergies  Allergen Reactions  . Ace Inhibitors  Causes high K  . Angiotensin Receptor Blockers     Causes high K  . Penicillins Swelling and Rash  . Tetracycline Swelling and Rash   Current Outpatient Prescriptions on File Prior to Visit  Medication Sig Dispense Refill  . aspirin EC 81 MG tablet Take 81 mg by mouth daily.      . Cholecalciferol 1000 UNITS capsule Take 1,000 Units by mouth daily.        . clopidogrel (PLAVIX) 75 MG tablet TAKE 1 TABLET BY MOUTH EVERY DAY  30 tablet  5  . fish oil-omega-3 fatty acids 1000 MG capsule Take 1 g by mouth daily.       Marland Kitchen FOLIC ACID PO Take 1 tablet by mouth daily.       Marland Kitchen glucose blood test strip To check blood sugar for poorly controlled diabetes twice daily and as needed. Dx: 250.00  100 each  3  . metFORMIN (GLUCOPHAGE) 1000 MG tablet Take 1 tablet (1,000 mg total) by mouth 2 (two) times daily with a meal.  180 tablet  0  . methotrexate (RHEUMATREX) 2.5 MG tablet Take by mouth once a week. 7 TABS WEEKLY ./CY      . metoprolol succinate (TOPROL-XL) 25 MG 24 hr tablet TAKE 1 TABLET (25 MG TOTAL) BY MOUTH DAILY.  90 tablet  1  . nitroGLYCERIN (NITROSTAT) 0.4 MG SL tablet Place 1 tablet (0.4 mg total) under the tongue every 5 (five) minutes as needed. For chest pain.  25 tablet  12  . omeprazole (PRILOSEC) 40 MG capsule Take 40 mg by mouth daily.        . pravastatin (PRAVACHOL) 20 MG tablet Take 1 tablet (20 mg total) by mouth daily.  30 tablet  11  . predniSONE (DELTASONE) 5 MG tablet        No current facility-administered medications on file prior to visit.     Review of Systems Review of Systems  Constitutional: Negative for fever, appetite change,  and unexpected weight change.  Eyes: Negative for pain and visual disturbance.  Respiratory: Negative for cough and shortness of breath.   Cardiovascular: Negative for cp or palpitations    Gastrointestinal: Negative for nausea, diarrhea and  constipation.  Genitourinary: Negative for urgency and frequency.  Skin: Negative for pallor or rash   Neurological: Negative for weakness, light-headedness, numbness and headaches.  Hematological: Negative for adenopathy. Does not bruise/bleed easily.  Psychiatric/Behavioral: Negative for dysphoric mood. The patient is not nervous/anxious.         Objective:   Physical Exam  Constitutional: He appears well-developed and well-nourished. No distress.  HENT:  Head: Normocephalic and atraumatic.  Mouth/Throat: Oropharynx is clear and moist.  Eyes: Conjunctivae and EOM are normal. Pupils are equal, round, and reactive to light. No scleral icterus.  Neck: Normal range of motion. Neck supple. No JVD present. Carotid bruit is present. No thyromegaly present.  Cardiovascular: Normal rate, regular rhythm and intact distal pulses.  Exam reveals no gallop.   Murmur heard. Pulmonary/Chest: Effort normal and breath sounds normal. No respiratory distress. He has no wheezes. He exhibits no tenderness.  Diffusely distant bs   Abdominal: Soft. Bowel sounds are normal. He exhibits no distension, no abdominal bruit and no mass. There is no tenderness.  Musculoskeletal: He exhibits no edema and no tenderness.  Lymphadenopathy:    He has no cervical adenopathy.  Neurological: He is alert. He has normal reflexes. No cranial nerve deficit. He exhibits normal muscle tone. Coordination normal.  Skin: Skin is warm and dry. No rash noted. No erythema. No pallor.  Psychiatric: He has a normal mood and affect.          Assessment & Plan:   Problem List Items Addressed This Visit     Cardiovascular and Mediastinum   ESSENTIAL HYPERTENSION, BENIGN - Primary      bp in fair control at this time  BP Readings from Last 1 Encounters:  11/14/13 114/62   No changes needed Disc lifstyle change with low sodium diet and exercise  Lab rev  Watching renal fxn      Endocrine   DIABETES MELLITUS, TYPE II      A1C is up  Disc DM diet and exercise Coming off prednisone soon- hope that will allow [place] to improve and stop metformin (esp in light of kidney insuff) Will work on habits and check gluc daily  F/u sept     Relevant Orders      Basic Metabolic Panel     Genitourinary   Renal insufficiency     Likely multifactorial microalb is imp  Suspect that off prednisone we may be able to cut or stop metformin which will help Enc water intake  Continue rheum f/u also  Lab again in 1 mo        Other   HYPERLIPIDEMIA     Disc goals for lipids and reasons to control them Rev labs with pt Rev low sat fat diet in detail On pravastatin and diet     ANEMIA-UNSPECIFIED     Worse today  ? If rel to rheum med or chronic dz or DM or renal insuff  Re check with ferritin and retic ct 1 mo  Pt does not tol iron but will eat beets "which helped it in the past" Close f/u  Rheum aware- on mtx and remicaide at this time - watched closely    Relevant Orders      CBC with Differential      Ferritin      Reticulocytes

## 2013-11-14 NOTE — Assessment & Plan Note (Signed)
Worse today  ? If rel to rheum med or chronic dz or DM or renal insuff  Re check with ferritin and retic ct 1 mo  Pt does not tol iron but will eat beets "which helped it in the past" Close f/u  Rheum aware- on mtx and remicaide at this time - watched closely

## 2013-11-14 NOTE — Progress Notes (Signed)
Pre visit review using our clinic review tool, if applicable. No additional management support is needed unless otherwise documented below in the visit note. 

## 2013-11-14 NOTE — Assessment & Plan Note (Signed)
Disc goals for lipids and reasons to control them Rev labs with pt Rev low sat fat diet in detail On pravastatin and diet

## 2013-11-14 NOTE — Assessment & Plan Note (Signed)
A1C is up  Disc DM diet and exercise Coming off prednisone soon- hope that will allow [place] to improve and stop metformin (esp in light of kidney insuff) Will work on habits and check gluc daily  F/u sept

## 2013-11-14 NOTE — Patient Instructions (Signed)
Please schedule non fasting labs in 1 month for chemistry/kidney function and for anemia Eat beets  Try to stay on a diabetic diet the best you can and start exercising I hope of the prednisone this gets much better  Follow up with me in mid sept with labs prior

## 2013-11-21 ENCOUNTER — Ambulatory Visit (HOSPITAL_COMMUNITY): Payer: Medicare Other | Attending: Cardiovascular Disease | Admitting: Cardiology

## 2013-11-21 DIAGNOSIS — E119 Type 2 diabetes mellitus without complications: Secondary | ICD-10-CM | POA: Insufficient documentation

## 2013-11-21 DIAGNOSIS — I6529 Occlusion and stenosis of unspecified carotid artery: Secondary | ICD-10-CM

## 2013-11-21 DIAGNOSIS — I6523 Occlusion and stenosis of bilateral carotid arteries: Secondary | ICD-10-CM

## 2013-11-21 DIAGNOSIS — I658 Occlusion and stenosis of other precerebral arteries: Secondary | ICD-10-CM | POA: Insufficient documentation

## 2013-11-21 DIAGNOSIS — E785 Hyperlipidemia, unspecified: Secondary | ICD-10-CM | POA: Insufficient documentation

## 2013-11-21 DIAGNOSIS — R51 Headache: Secondary | ICD-10-CM | POA: Insufficient documentation

## 2013-11-21 DIAGNOSIS — I2510 Atherosclerotic heart disease of native coronary artery without angina pectoris: Secondary | ICD-10-CM | POA: Insufficient documentation

## 2013-11-21 DIAGNOSIS — Z87891 Personal history of nicotine dependence: Secondary | ICD-10-CM | POA: Insufficient documentation

## 2013-11-21 DIAGNOSIS — I10 Essential (primary) hypertension: Secondary | ICD-10-CM | POA: Insufficient documentation

## 2013-11-21 NOTE — Progress Notes (Signed)
Carotid duplex performed 

## 2013-12-19 ENCOUNTER — Other Ambulatory Visit (INDEPENDENT_AMBULATORY_CARE_PROVIDER_SITE_OTHER): Payer: Medicare Other

## 2013-12-19 DIAGNOSIS — D649 Anemia, unspecified: Secondary | ICD-10-CM

## 2013-12-19 DIAGNOSIS — E119 Type 2 diabetes mellitus without complications: Secondary | ICD-10-CM

## 2013-12-19 LAB — BASIC METABOLIC PANEL
BUN: 21 mg/dL (ref 6–23)
CO2: 23 mEq/L (ref 19–32)
Calcium: 9.4 mg/dL (ref 8.4–10.5)
Chloride: 108 mEq/L (ref 96–112)
Creatinine, Ser: 1.3 mg/dL (ref 0.4–1.5)
GFR: 58.32 mL/min — AB (ref >60.00)
GLUCOSE: 129 mg/dL — AB (ref 70–99)
Potassium: 5.9 mEq/L — ABNORMAL HIGH (ref 3.5–5.1)
Sodium: 138 mEq/L (ref 135–145)

## 2013-12-19 LAB — CBC WITH DIFFERENTIAL/PLATELET
Basophils Absolute: 0 10*3/uL (ref 0.0–0.1)
Basophils Relative: 0.4 % (ref 0.0–3.0)
Eosinophils Absolute: 0.1 10*3/uL (ref 0.0–0.7)
Eosinophils Relative: 1 % (ref 0.0–5.0)
HEMATOCRIT: 32.2 % — AB (ref 39.0–52.0)
Hemoglobin: 10.5 g/dL — ABNORMAL LOW (ref 13.0–17.0)
LYMPHS ABS: 3.1 10*3/uL (ref 0.7–4.0)
Lymphocytes Relative: 47.7 % — ABNORMAL HIGH (ref 12.0–46.0)
MCHC: 32.5 g/dL (ref 30.0–36.0)
MCV: 94.2 fl (ref 78.0–100.0)
Monocytes Absolute: 0.6 10*3/uL (ref 0.1–1.0)
Monocytes Relative: 9.8 % (ref 3.0–12.0)
NEUTROS ABS: 2.7 10*3/uL (ref 1.4–7.7)
Neutrophils Relative %: 41.1 % — ABNORMAL LOW (ref 43.0–77.0)
Platelets: 291 10*3/uL (ref 150.0–400.0)
RBC: 3.42 Mil/uL — AB (ref 4.22–5.81)
RDW: 16.7 % — ABNORMAL HIGH (ref 11.5–15.5)
WBC: 6.5 10*3/uL (ref 4.0–10.5)

## 2013-12-19 LAB — FERRITIN: Ferritin: 23 ng/mL (ref 22.0–322.0)

## 2013-12-20 LAB — RETICULOCYTES
ABS Retic: 38.7 10*3/uL (ref 19.0–186.0)
RBC.: 3.52 MIL/uL — ABNORMAL LOW (ref 4.22–5.81)
Retic Ct Pct: 1.1 % (ref 0.4–2.3)

## 2013-12-21 ENCOUNTER — Other Ambulatory Visit: Payer: Self-pay | Admitting: Family Medicine

## 2013-12-25 ENCOUNTER — Telehealth: Payer: Self-pay | Admitting: Family Medicine

## 2013-12-25 DIAGNOSIS — D649 Anemia, unspecified: Secondary | ICD-10-CM

## 2013-12-25 NOTE — Telephone Encounter (Signed)
Ref done  

## 2013-12-25 NOTE — Telephone Encounter (Signed)
Message copied by Abner Greenspan on Thu Dec 25, 2013  9:51 AM ------      Message from: Tammi Sou      Created: Wed Dec 24, 2013  4:43 PM       Pt notified of lab results and Dr. Marliss Coots comments. Pt agrees with referral to oncologist for eval and tx. I advised pt Marion/Linda will call to schedule appt.      Lab appt scheduled for Friday 12/26/13 to recheck K, pt said he isn't taking any new medication or herb only thing he takes is a multivitamin every day ------

## 2013-12-26 ENCOUNTER — Other Ambulatory Visit (INDEPENDENT_AMBULATORY_CARE_PROVIDER_SITE_OTHER): Payer: Medicare Other

## 2013-12-26 DIAGNOSIS — E875 Hyperkalemia: Secondary | ICD-10-CM

## 2013-12-26 LAB — POTASSIUM: Potassium: 5.8 mEq/L — ABNORMAL HIGH (ref 3.5–5.1)

## 2013-12-30 ENCOUNTER — Other Ambulatory Visit: Payer: Self-pay | Admitting: *Deleted

## 2013-12-30 DIAGNOSIS — E785 Hyperlipidemia, unspecified: Secondary | ICD-10-CM

## 2013-12-30 MED ORDER — PRAVASTATIN SODIUM 20 MG PO TABS: 20.0000 mg | ORAL_TABLET | Freq: Every day | ORAL | Status: DC

## 2013-12-31 ENCOUNTER — Telehealth: Payer: Self-pay | Admitting: Hematology and Oncology

## 2013-12-31 NOTE — Telephone Encounter (Signed)
S/W PATIENT AND GAVE NP APPT FOR 07/31 @ 8:30 W/DR. GORSUCH REFERRING DR. MARNE TOWER DX- ANEMIA

## 2014-01-05 ENCOUNTER — Other Ambulatory Visit: Payer: Self-pay | Admitting: Family Medicine

## 2014-01-05 DIAGNOSIS — E875 Hyperkalemia: Secondary | ICD-10-CM

## 2014-01-09 ENCOUNTER — Encounter: Payer: Self-pay | Admitting: Hematology and Oncology

## 2014-01-09 ENCOUNTER — Ambulatory Visit: Payer: Medicare Other

## 2014-01-09 ENCOUNTER — Other Ambulatory Visit (INDEPENDENT_AMBULATORY_CARE_PROVIDER_SITE_OTHER): Payer: Medicare Other

## 2014-01-09 ENCOUNTER — Ambulatory Visit (HOSPITAL_BASED_OUTPATIENT_CLINIC_OR_DEPARTMENT_OTHER): Payer: Medicare Other | Admitting: Hematology and Oncology

## 2014-01-09 VITALS — BP 122/70 | HR 63 | Temp 97.9°F | Resp 20 | Ht 68.0 in | Wt 179.9 lb

## 2014-01-09 DIAGNOSIS — L4050 Arthropathic psoriasis, unspecified: Secondary | ICD-10-CM

## 2014-01-09 DIAGNOSIS — D649 Anemia, unspecified: Secondary | ICD-10-CM

## 2014-01-09 DIAGNOSIS — E875 Hyperkalemia: Secondary | ICD-10-CM

## 2014-01-09 LAB — POTASSIUM: POTASSIUM: 4.4 meq/L (ref 3.5–5.1)

## 2014-01-09 NOTE — Progress Notes (Signed)
Checked in new patient with no financial issues prior to seeing the dr. He has appt card and has [place] been out of the country. °

## 2014-01-09 NOTE — Progress Notes (Signed)
[facility] NOTE  Patient Care Team: Abner Greenspan, MD as PCP - General Heath Lark, MD as Consulting Physician (Hematology and Oncology)  CHIEF COMPLAINTS/PURPOSE OF CONSULTATION:  Acute on chronic anemia  HISTORY OF PRESENTING ILLNESS:  Curtis Frazier 72 y.o. male is here because of mild progressive anemia  He was found to have abnormal CBC from routine blood count monitoring. His baseline hemoglobin is around 12. Recently, starting around May of this year, his hemoglobin dropped to 10.5. Recheck CBC 6 weeks later confirmed similar findings with no evidence of reticulocytosis. Around the same time, the patient had brief upper respiratory tract infection, resolved with antibiotic therapy. 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 He remembered being on methotrexate for over 9 years. He was also started on prednisone intermittently for polymyalgia. His rheumatologist started him on Remicade around the same time as well. Colonoscopy in March of 2015 detected for polyps, all benign. He denies recent chest pain on exertion, shortness of breath on minimal exertion, pre-syncopal episodes, or palpitations. He had not noticed any recent bleeding such as epistaxis, hematuria or hematochezia The patient denies over the counter NSAID ingestion. He is on dual antiplatelets agents.  He had no prior history or diagnosis of cancer. His age appropriate screening programs are up-to-date. He denies any pica and eats a variety of diet. He never donated blood. He had received received blood transfusion many years ago due to GI bleed while on antiplatelet agents after his heart attack.   MEDICAL HISTORY:  Past Medical History  Diagnosis Date  . Other and unspecified hyperlipidemia   . Coronary atherosclerosis of unspecified type of vessel, native or graft   . Personal history of colonic polyps   . Actinic keratosis   . Psoriatic arthropathy   . Other  psoriasis   . Scoliosis (and kyphoscoliosis), idiopathic   . GI bleed 12/10    cecam AVM  . Gastritis 12/10  . Hyperlipidemia     takes Pravastatin daily  . Hypertension     takes Metoprolol daily  . Myocardial infarction 2010  . Shortness of breath     with exertion  . Pneumonia     hx of-2008  . Headache(784.0)     occasionally  . Joint pain   . Joint swelling   . Rheumatoid arthritis(714.0)     takes Methotrexate 7pills weekly  . Degeneration of intervertebral disc, site unspecified   . Chronic back pain     scoliosis/stenosis/radiculopathy,degenerative disc disease  . Psoriasis   . Bruises easily   . Esophageal reflux     takes Omeprazole daily  . History of colonic polyps   . Hemorrhoids   . Kidney stone     hx of  . Blood transfusion 2010  . Type II or unspecified type diabetes mellitus without mention of complication, not stated as uncontrolled     type II;controlled by diet and exercise  . 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 S/P PTCA (percutaneous transluminal coronary angioplasty)     SURGICAL HISTORY: Past Surgical History  Procedure Laterality Date  . Rotator cuff repair Left 2000  . Colonoscopy    . Vasectomy  1979  . Coronary angioplasty with stent placement  02/2009    has one stent  . Carotid doppler  10/12    0-39% R and 60-79% left   . Cataract extraction  2012  .  Cardiac catheterization  2010  . Lithotripsy  2009    SOCIAL HISTORY: History   Social History  . Marital Status: Married    Spouse Name: N/A    Number of Children: N/A  . Years of Education: N/A   Occupational History  . Not on file.   Social History Main Topics  . Smoking status: Former Research scientist (life sciences)  . Smokeless tobacco: Never Used     Comment: quit 2010  . Alcohol Use: Yes     Comment: occasional  . Drug Use: No  . Sexual Activity: No   Other Topics Concern  . Not on file   Social History Narrative  . No  narrative on file    FAMILY HISTORY: Family History  Problem Relation Age of Onset  . Coronary artery disease Mother   . Diabetes Mother   . CAD Mother   . Coronary artery disease Sister   . Hypertension Sister   . Kidney failure Sister   . CAD Sister   . Pancreatic cancer Brother   . Diabetes Brother   . Prostate cancer Brother   . Pancreatic cancer Brother   . Diabetes Brother   . Anesthesia problems Neg Hx   . Hypotension Neg Hx   . Malignant hyperthermia Neg Hx   . Pseudochol deficiency Neg Hx     ALLERGIES:  is allergic to ace inhibitors; angiotensin receptor blockers; penicillins; and tetracycline.  MEDICATIONS:  Current Outpatient Prescriptions  Medication Sig Dispense Refill  . aspirin EC 81 MG tablet Take 81 mg by mouth daily.      . Calcium Carbonate-Vit D-Min (CALCIUM 1200 PO) Take by mouth daily.      . Cholecalciferol 1000 UNITS capsule Take 1,000 Units by mouth daily.        . clopidogrel (PLAVIX) 75 MG tablet TAKE 1 TABLET BY MOUTH EVERY DAY  30 tablet  5  . fish oil-omega-3 fatty acids 1000 MG capsule Take 1 g by mouth daily.       Marland Kitchen FOLIC ACID PO Take 1 tablet by mouth daily.       Marland Kitchen glucose blood test strip To check blood sugar for poorly controlled diabetes twice daily and as needed. Dx: 250.00  100 each  3  . guaiFENesin (MUCINEX) 600 MG 12 hr tablet Take by mouth 2 (two) times daily as needed.      . metFORMIN (GLUCOPHAGE) 1000 MG tablet TAKE 1 TABLET (1,000 MG TOTAL) BY MOUTH 2 (TWO) TIMES DAILY WITH A MEAL.  180 tablet  0  . methotrexate (RHEUMATREX) 2.5 MG tablet Take by mouth once a week. 7 TABS WEEKLY ./CY      . metoprolol succinate (TOPROL-XL) 25 MG 24 hr tablet TAKE 1 TABLET (25 MG TOTAL) BY MOUTH DAILY.  90 tablet  1  . nitroGLYCERIN (NITROSTAT) 0.4 MG SL tablet Place 1 tablet (0.4 mg total) under the tongue every 5 (five) minutes as needed. For chest pain.  25 tablet  12  . omeprazole (PRILOSEC) 40 MG capsule Take 40 mg by mouth daily.        .  pravastatin (PRAVACHOL) 20 MG tablet Take 1 tablet (20 mg total) by mouth daily.  30 tablet  1  . predniSONE (DELTASONE) 5 MG tablet        No current facility-administered medications for this visit.    REVIEW OF SYSTEMS:   Constitutional: Denies fevers, chills or abnormal night sweats Eyes: Denies blurriness of vision, double vision or watery eyes  Ears, nose, mouth, throat, and face: Denies mucositis or sore throat Respiratory: Denies cough, dyspnea or wheezes Cardiovascular: Denies palpitation, chest discomfort or lower extremity swelling Gastrointestinal:  Denies nausea, heartburn or change in bowel habits Skin: Denies abnormal skin rashes Lymphatics: Denies new lymphadenopathy or easy bruising Neurological:Denies numbness, tingling or new weaknesses Behavioral/Psych: Mood is stable, no new changes  All other systems were reviewed with the patient and are negative.  PHYSICAL EXAMINATION: ECOG PERFORMANCE STATUS: 0 - Asymptomatic  Filed Vitals:   01/09/14 0907  BP: 122/70  Pulse: 63  Temp: 97.9 F (36.6 C)  Resp: 20   GENERAL:alert, no distress and comfortable SKIN: skin color, texture, turgor are normal, no rashes or significant lesions EYES: normal, conjunctiva are pink and non-injected, sclera clear OROPHARYNX:no exudate, no erythema and lips, buccal mucosa, and tongue normal  NECK: supple, thyroid normal size, non-tender, without nodularity LYMPH:  no palpable lymphadenopathy in the cervical, axillary or inguinal LUNGS: clear to auscultation and percussion with normal breathing effort HEART: regular rate & rhythm and no murmurs and no lower extremity edema ABDOMEN:abdomen soft, non-tender and normal bowel sounds Musculoskeletal:no cyanosis of digits and no clubbing  PSYCH: alert & oriented x 3 with fluent speech NEURO: no focal motor/sensory deficits  LABORATORY DATA:  I have reviewed the data as listed Recent Results (from the past 2160 hour(s))  CBC WITH  DIFFERENTIAL     Status: Abnormal   Collection Time    11/07/13  9:18 AM      Result Value Ref Range   WBC 6.6  4.0 - 10.5 K/uL   RBC 3.33 (*) 4.22 - 5.81 Mil/uL   Hemoglobin 10.5 (*) 13.0 - 17.0 g/dL   HCT 32.2 (*) 39.0 - 52.0 %   MCV 96.8  78.0 - 100.0 fl   MCHC 32.5  30.0 - 36.0 g/dL   RDW 16.9 (*) 11.5 - 15.5 %   Platelets 239.0  150.0 - 400.0 K/uL   Neutrophils Relative % 52.2  43.0 - 77.0 %   Lymphocytes Relative 35.7  12.0 - 46.0 %   Monocytes Relative 11.0  3.0 - 12.0 %   Eosinophils Relative 0.7  0.0 - 5.0 %   Basophils Relative 0.4  0.0 - 3.0 %   Neutro Abs 3.4  1.4 - 7.7 K/uL   Lymphs Abs 2.3  0.7 - 4.0 K/uL   Monocytes Absolute 0.7  0.1 - 1.0 K/uL   Eosinophils Absolute 0.0  0.0 - 0.7 K/uL   Basophils Absolute 0.0  0.0 - 0.1 K/uL  COMPREHENSIVE METABOLIC PANEL     Status: Abnormal   Collection Time    11/07/13  9:18 AM      Result Value Ref Range   Sodium 141  135 - 145 mEq/L   Potassium 4.8  3.5 - 5.1 mEq/L   Chloride 111  96 - 112 mEq/L   CO2 27  19 - 32 mEq/L   Glucose, Bld 121 (*) 70 - 99 mg/dL   BUN 18  6 - 23 mg/dL   Creatinine, Ser 1.5  0.4 - 1.5 mg/dL   Total Bilirubin 0.6  0.2 - 1.2 mg/dL   Alkaline Phosphatase 53  39 - 117 U/L   AST 19  0 - 37 U/L   ALT 14  0 - 53 U/L   Total Protein 6.3  6.0 - 8.3 g/dL   Albumin 3.5  3.5 - 5.2 g/dL   Calcium 8.8  8.4 - 10.5 mg/dL  GFR 49.34 (*) >60.00 mL/min  HEMOGLOBIN A1C     Status: Abnormal   Collection Time    11/07/13  9:18 AM      Result Value Ref Range   Hemoglobin A1C 7.2 (*) 4.6 - 6.5 %   Comment: Glycemic Control Guidelines for People with Diabetes:Non Diabetic:  <6%Goal of Therapy: <7%Additional Action Suggested:  >8%   LIPID PANEL     Status: Abnormal   Collection Time    11/07/13  9:18 AM      Result Value Ref Range   Cholesterol 181  0 - 200 mg/dL   Comment: ATP III Classification       Desirable:  < 200 mg/dL               Borderline High:  200 - 239 mg/dL          High:  > = 240 mg/dL    Triglycerides 86.0  0.0 - 149.0 mg/dL   Comment: Normal:  <150 mg/dLBorderline High:  150 - 199 mg/dL   HDL 39.80  >39.00 mg/dL   VLDL 17.2  0.0 - 40.0 mg/dL   LDL Cholesterol 124 (*) 0 - 99 mg/dL   Total CHOL/HDL Ratio 5     Comment:                Men          Women1/2 Average Risk     3.4          3.3Average Risk          5.0          4.42X Average Risk          9.6          7.13X Average Risk          15.0          11.0                      TSH     Status: None   Collection Time    11/07/13  9:18 AM      Result Value Ref Range   TSH 0.51  0.35 - 4.50 uIU/mL  MICROALBUMIN / CREATININE URINE RATIO     Status: Abnormal   Collection Time    11/07/13 11:14 AM      Result Value Ref Range   Microalb, Ur 3.3 (*) 0.0 - 1.9 mg/dL   Creatinine,U 130.9     Microalb Creat Ratio 2.5  0.0 - 30.0 mg/g  CBC WITH DIFFERENTIAL     Status: Abnormal   Collection Time    12/19/13  8:19 AM      Result Value Ref Range   WBC 6.5  4.0 - 10.5 K/uL   RBC 3.42 (*) 4.22 - 5.81 Mil/uL   Hemoglobin 10.5 (*) 13.0 - 17.0 g/dL   HCT 32.2 (*) 39.0 - 52.0 %   MCV 94.2  78.0 - 100.0 fl   MCHC 32.5  30.0 - 36.0 g/dL   RDW 16.7 (*) 11.5 - 15.5 %   Platelets 291.0  150.0 - 400.0 K/uL   Neutrophils Relative % 41.1 (*) 43.0 - 77.0 %   Lymphocytes Relative 47.7 (*) 12.0 - 46.0 %   Monocytes Relative 9.8  3.0 - 12.0 %   Eosinophils Relative 1.0  0.0 - 5.0 %   Basophils Relative 0.4  0.0 - 3.0 %   Neutro Abs 2.7  1.4 -  7.7 K/uL   Lymphs Abs 3.1  0.7 - 4.0 K/uL   Monocytes Absolute 0.6  0.1 - 1.0 K/uL   Eosinophils Absolute 0.1  0.0 - 0.7 K/uL   Basophils Absolute 0.0  0.0 - 0.1 K/uL  BASIC METABOLIC PANEL     Status: Abnormal   Collection Time    12/19/13  8:19 AM      Result Value Ref Range   Sodium 138  135 - 145 mEq/L   Potassium 5.9 (*) 3.5 - 5.1 mEq/L   Chloride 108  96 - 112 mEq/L   CO2 23  19 - 32 mEq/L   Glucose, Bld 129 (*) 70 - 99 mg/dL   BUN 21  6 - 23 mg/dL   Creatinine, Ser 1.3  0.4 - 1.5 mg/dL    Calcium 9.4  8.4 - 10.5 mg/dL   GFR 58.32 (*) >60.00 mL/min  FERRITIN     Status: None   Collection Time    12/19/13  8:19 AM      Result Value Ref Range   Ferritin 23.0  22.0 - 322.0 ng/mL  RETICULOCYTES     Status: Abnormal   Collection Time    12/19/13  8:19 AM      Result Value Ref Range   Retic Ct Pct 1.1  0.4 - 2.3 %   RBC. 3.52 (*) 4.22 - 5.81 MIL/uL   ABS Retic 38.7  19.0 - 186.0 K/uL  POTASSIUM     Status: Abnormal   Collection Time    12/26/13  8:14 AM      Result Value Ref Range   Potassium 5.8 (*) 3.5 - 5.1 mEq/L   ASSESSMENT & PLAN:  ANEMIA-UNSPECIFIED The cause of his anemia is multifactorial, likely a combination of anemia of chronic disease, bone marrow suppression from chronic methotrexate therapy and recent infection. His ferritin level is borderline low. Serum vitamin B 12 level was not checked. I recommend he takes one oral iron supplement a day for 3 months along with vitamin B12 supplements over-the-counter for 3 months. I recommend he follows with his primary care provider or his rheumatologist and have another repeat CBC at the end of the year. The patient is instructed to call me once the blood is done so I can review with him over the telephone. I suspect, his blood count will continue to improve in the near future. I do not feel strongly we need to pursue additional workup of a bone marrow aspirate and biopsy. He agreed with the plan of care.  PSORIATIC ARTHRITIS His program was switched and Remicade was started recently. He was informed that the future plan would be to taper off prednisone and methotrexate. I suspect his blood count will improve in the future with this.      All questions were answered. The patient knows to call the clinic with any problems, questions or concerns. I spent 30 minutes counseling the patient face to face. The total time spent in the appointment was 40 minutes and more than 50% was on counseling.     [place], [place],  MD 01/09/2014 9:04 AM

## 2014-01-09 NOTE — Assessment & Plan Note (Signed)
The cause of his anemia is multifactorial, likely a combination of anemia of chronic disease, bone marrow suppression from chronic methotrexate therapy and recent infection. His ferritin level is borderline low. Serum vitamin B 12 level was not checked. I recommend he takes one oral iron supplement a day for 3 months along with vitamin B12 supplements over-the-counter for 3 months. I recommend he follows with his primary care provider or his rheumatologist and have another repeat CBC at the end of the year. The patient is instructed to call me once the blood is done so I can review with him over the telephone. I suspect, his blood count will continue to improve in the near future. I do not feel strongly we need to pursue additional workup of a bone marrow aspirate and biopsy. He agreed with the plan of care.

## 2014-01-09 NOTE — Assessment & Plan Note (Signed)
His program was switched and Remicade was started recently. He was informed that the future plan would be to taper off prednisone and methotrexate. I suspect his blood count will improve in the future with this.

## 2014-01-13 ENCOUNTER — Other Ambulatory Visit: Payer: Self-pay | Admitting: Family Medicine

## 2014-01-13 ENCOUNTER — Telehealth: Payer: Self-pay | Admitting: Family Medicine

## 2014-01-13 NOTE — Telephone Encounter (Signed)
Spoke with patient.

## 2014-01-13 NOTE — Telephone Encounter (Signed)
Pt returned your call regarding lab results.  [phone]

## 2014-01-24 LAB — HM DIABETES EYE EXAM

## 2014-02-19 ENCOUNTER — Telehealth: Payer: Self-pay | Admitting: Family Medicine

## 2014-02-19 DIAGNOSIS — E119 Type 2 diabetes mellitus without complications: Secondary | ICD-10-CM

## 2014-02-19 DIAGNOSIS — N289 Disorder of kidney and ureter, unspecified: Secondary | ICD-10-CM

## 2014-02-19 DIAGNOSIS — I10 Essential (primary) hypertension: Secondary | ICD-10-CM

## 2014-02-19 DIAGNOSIS — E785 Hyperlipidemia, unspecified: Secondary | ICD-10-CM

## 2014-02-19 DIAGNOSIS — D649 Anemia, unspecified: Secondary | ICD-10-CM

## 2014-02-19 NOTE — Telephone Encounter (Signed)
Message copied by Abner Greenspan on Thu Feb 19, 2014 11:26 AM ------      Message from: Ellamae Sia      Created: Wed Feb 11, 2014 10:47 AM      Regarding: Lab orders for Friday, 9.11.15       Lab orders,f/u appt. Needs LDL for diabetic bundle ------

## 2014-02-20 ENCOUNTER — Other Ambulatory Visit (INDEPENDENT_AMBULATORY_CARE_PROVIDER_SITE_OTHER): Payer: Medicare Other

## 2014-02-20 DIAGNOSIS — R7989 Other specified abnormal findings of blood chemistry: Secondary | ICD-10-CM

## 2014-02-20 DIAGNOSIS — I10 Essential (primary) hypertension: Secondary | ICD-10-CM

## 2014-02-20 DIAGNOSIS — Z0000 Encounter for general adult medical examination without abnormal findings: Secondary | ICD-10-CM

## 2014-02-20 DIAGNOSIS — D649 Anemia, unspecified: Secondary | ICD-10-CM

## 2014-02-20 DIAGNOSIS — E875 Hyperkalemia: Secondary | ICD-10-CM

## 2014-02-20 DIAGNOSIS — R946 Abnormal results of thyroid function studies: Secondary | ICD-10-CM

## 2014-02-20 DIAGNOSIS — E119 Type 2 diabetes mellitus without complications: Secondary | ICD-10-CM

## 2014-02-20 DIAGNOSIS — N289 Disorder of kidney and ureter, unspecified: Secondary | ICD-10-CM

## 2014-02-20 DIAGNOSIS — E785 Hyperlipidemia, unspecified: Secondary | ICD-10-CM

## 2014-02-20 LAB — COMPREHENSIVE METABOLIC PANEL
ALT: 15 U/L (ref 0–53)
AST: 17 U/L (ref 0–37)
Albumin: 3.7 g/dL (ref 3.5–5.2)
Alkaline Phosphatase: 52 U/L (ref 39–117)
BILIRUBIN TOTAL: 0.6 mg/dL (ref 0.2–1.2)
BUN: 22 mg/dL (ref 6–23)
CALCIUM: 9.4 mg/dL (ref 8.4–10.5)
CHLORIDE: 108 meq/L (ref 96–112)
CO2: 26 mEq/L (ref 19–32)
Creatinine, Ser: 1.5 mg/dL (ref 0.4–1.5)
GFR: 47.81 mL/min — ABNORMAL LOW (ref >60.00)
Glucose, Bld: 118 mg/dL — ABNORMAL HIGH (ref 70–99)
Potassium: 5.2 mEq/L — ABNORMAL HIGH (ref 3.5–5.1)
Sodium: 140 mEq/L (ref 135–145)
Total Protein: 7.1 g/dL (ref 6.0–8.3)

## 2014-02-20 LAB — CBC WITH DIFFERENTIAL/PLATELET
BASOS ABS: 0 10*3/uL (ref 0.0–0.1)
Basophils Relative: 0.6 % (ref 0.0–3.0)
EOS ABS: 0.2 10*3/uL (ref 0.0–0.7)
Eosinophils Relative: 3.1 % (ref 0.0–5.0)
HEMATOCRIT: 35.5 % — AB (ref 39.0–52.0)
Hemoglobin: 11.5 g/dL — ABNORMAL LOW (ref 13.0–17.0)
LYMPHS ABS: 3.4 10*3/uL (ref 0.7–4.0)
LYMPHS PCT: 46 % (ref 12.0–46.0)
MCHC: 32.3 g/dL (ref 30.0–36.0)
MCV: 92.5 fl (ref 78.0–100.0)
MONO ABS: 0.8 10*3/uL (ref 0.1–1.0)
Monocytes Relative: 10.5 % (ref 3.0–12.0)
NEUTROS ABS: 3 10*3/uL (ref 1.4–7.7)
Neutrophils Relative %: 39.8 % — ABNORMAL LOW (ref 43.0–77.0)
Platelets: 263 10*3/uL (ref 150.0–400.0)
RBC: 3.84 Mil/uL — ABNORMAL LOW (ref 4.22–5.81)
RDW: 17.4 % — AB (ref 11.5–15.5)
WBC: 7.5 10*3/uL (ref 4.0–10.5)

## 2014-02-20 LAB — LIPID PANEL
CHOLESTEROL: 212 mg/dL — AB (ref 0–200)
HDL: 35.5 mg/dL — AB (ref >39.00)
LDL Cholesterol: 157 mg/dL — ABNORMAL HIGH (ref 0–99)
NonHDL: 176.5
Total CHOL/HDL Ratio: 6
Triglycerides: 98 mg/dL (ref 0.0–149.0)
VLDL: 19.6 mg/dL (ref 0.0–40.0)

## 2014-02-20 LAB — HEMOGLOBIN A1C: Hgb A1c MFr Bld: 7.2 % — ABNORMAL HIGH (ref 4.6–6.5)

## 2014-02-24 ENCOUNTER — Other Ambulatory Visit: Payer: Self-pay | Admitting: Family Medicine

## 2014-02-27 ENCOUNTER — Encounter: Payer: Self-pay | Admitting: Family Medicine

## 2014-02-27 ENCOUNTER — Ambulatory Visit (INDEPENDENT_AMBULATORY_CARE_PROVIDER_SITE_OTHER): Payer: Medicare Other | Admitting: Family Medicine

## 2014-02-27 VITALS — BP 130/68 | HR 55 | Temp 98.3°F | Ht 68.0 in | Wt 180.5 lb

## 2014-02-27 DIAGNOSIS — I2510 Atherosclerotic heart disease of native coronary artery without angina pectoris: Secondary | ICD-10-CM

## 2014-02-27 DIAGNOSIS — E119 Type 2 diabetes mellitus without complications: Secondary | ICD-10-CM

## 2014-02-27 DIAGNOSIS — N289 Disorder of kidney and ureter, unspecified: Secondary | ICD-10-CM

## 2014-02-27 DIAGNOSIS — E785 Hyperlipidemia, unspecified: Secondary | ICD-10-CM

## 2014-02-27 MED ORDER — GLIPIZIDE ER 5 MG PO TB24: 5.0000 mg | ORAL_TABLET | Freq: Every day | ORAL | Status: DC

## 2014-02-27 NOTE — Patient Instructions (Signed)
I think your metformin is working kidneys too hard  Hold it for now  Start glipizide 5 mg daily (extended release)- and do not skip meals Keep track of blood sugar  Follow up with me in 2-3 weeks with labs prior and bring your blood sugar log with you For cholesterol   Avoid red meat/ fried foods/ egg yolks/ fatty breakfast meats/ butter, cheese and high fat dairy/ and shellfish  Fat and Cholesterol Control Diet Fat and cholesterol levels in your blood and organs are influenced by your diet. High levels of fat and cholesterol may lead to diseases of the heart, small and large blood vessels, gallbladder, liver, and pancreas. CONTROLLING FAT AND CHOLESTEROL WITH DIET Although exercise and lifestyle factors are important, your diet is key. That is because certain foods are known to raise cholesterol and others to lower it. The goal is to balance foods for their effect on cholesterol and more importantly, to replace saturated and trans fat with other types of fat, such as monounsaturated fat, polyunsaturated fat, and omega-3 fatty acids. On average, a person should consume no more than 15 to 17 g of saturated fat daily. Saturated and trans fats are considered "bad" fats, and they will raise LDL cholesterol. Saturated fats are primarily found in animal products such as meats, butter, and cream. However, that does not mean you need to give up all your favorite foods. Today, there are good tasting, low-fat, low-cholesterol substitutes for most of the things you like to eat. Choose low-fat or nonfat alternatives. Choose round or loin cuts of red meat. These types of cuts are lowest in fat and cholesterol. Chicken (without the skin), fish, veal, and ground [place] breast are great choices. Eliminate fatty meats, such as hot dogs and salami. Even shellfish have little or no saturated fat. Have a 3 oz (85 g) portion when you eat lean meat, poultry, or fish. Trans fats are also called "partially hydrogenated oils."  They are oils that have been scientifically manipulated so that they are solid at room temperature resulting in a longer shelf life and improved taste and texture of foods in which they are added. Trans fats are found in stick margarine, some tub margarines, cookies, crackers, and baked goods.  When baking and cooking, oils are a great substitute for butter. The monounsaturated oils are especially beneficial since it is believed they lower LDL and raise HDL. The oils you should avoid entirely are saturated tropical oils, such as coconut and palm.  Remember to eat a lot from food groups that are naturally free of saturated and trans fat, including fish, fruit, vegetables, beans, grains (barley, rice, couscous, bulgur wheat), and pasta (without cream sauces).  IDENTIFYING FOODS THAT LOWER FAT AND CHOLESTEROL  Soluble fiber may lower your cholesterol. This type of fiber is found in fruits such as apples, vegetables such as broccoli, potatoes, and carrots, legumes such as beans, peas, and lentils, and grains such as barley. Foods fortified with plant sterols (phytosterol) may also lower cholesterol. You should eat at least 2 g per day of these foods for a cholesterol lowering effect.  Read package labels to identify low-saturated fats, trans fat free, and low-fat foods at the supermarket. Select cheeses that have only 2 to 3 g saturated fat per ounce. Use a heart-healthy tub margarine that is free of trans fats or partially hydrogenated oil. When buying baked goods (cookies, crackers), avoid partially hydrogenated oils. Breads and muffins should be made from whole grains (whole-wheat or whole  oat flour, instead of "flour" or "enriched flour"). Buy non-creamy canned soups with reduced salt and no added fats.  FOOD PREPARATION TECHNIQUES  Never deep-fry. If you must fry, either stir-fry, which uses very little fat, or use non-stick cooking sprays. When possible, broil, bake, or roast meats, and steam vegetables.  Instead of putting butter or margarine on vegetables, use lemon and herbs, applesauce, and cinnamon (for squash and sweet potatoes). Use nonfat yogurt, salsa, and low-fat dressings for salads.  LOW-SATURATED FAT / LOW-FAT FOOD SUBSTITUTES Meats / Saturated Fat (g)  Avoid: Steak, marbled (3 oz/85 g) / 11 g  Choose: Steak, lean (3 oz/85 g) / 4 g  Avoid: Hamburger (3 oz/85 g) / 7 g  Choose: Hamburger, lean (3 oz/85 g) / 5 g  Avoid: Ham (3 oz/85 g) / 6 g  Choose: Ham, lean cut (3 oz/85 g) / 2.4 g  Avoid: Chicken, with skin, dark meat (3 oz/85 g) / 4 g  Choose: Chicken, skin removed, dark meat (3 oz/85 g) / 2 g  Avoid: Chicken, with skin, light meat (3 oz/85 g) / 2.5 g  Choose: Chicken, skin removed, light meat (3 oz/85 g) / 1 g Dairy / Saturated Fat (g)  Avoid: Whole milk (1 cup) / 5 g  Choose: Low-fat milk, 2% (1 cup) / 3 g  Choose: Low-fat milk, 1% (1 cup) / 1.5 g  Choose: Skim milk (1 cup) / 0.3 g  Avoid: Hard cheese (1 oz/28 g) / 6 g  Choose: Skim milk cheese (1 oz/28 g) / 2 to 3 g  Avoid: Cottage cheese, 4% fat (1 cup) / 6.5 g  Choose: Low-fat cottage cheese, 1% fat (1 cup) / 1.5 g  Avoid: Ice cream (1 cup) / 9 g  Choose: Sherbet (1 cup) / 2.5 g  Choose: Nonfat frozen yogurt (1 cup) / 0.3 g  Choose: Frozen fruit bar / trace  Avoid: Whipped cream (1 tbs) / 3.5 g  Choose: Nondairy whipped topping (1 tbs) / 1 g Condiments / Saturated Fat (g)  Avoid: Mayonnaise (1 tbs) / 2 g  Choose: Low-fat mayonnaise (1 tbs) / 1 g  Avoid: Butter (1 tbs) / 7 g  Choose: Extra light margarine (1 tbs) / 1 g  Avoid: Coconut oil (1 tbs) / 11.8 g  Choose: Olive oil (1 tbs) / 1.8 g  Choose: Corn oil (1 tbs) / 1.7 g  Choose: Safflower oil (1 tbs) / 1.2 g  Choose: Sunflower oil (1 tbs) / 1.4 g  Choose: Soybean oil (1 tbs) / 2.4 g  Choose: Canola oil (1 tbs) / 1 g Document Released: 05/29/2005 Document Revised: 09/23/2012 Document Reviewed: 08/27/2013 [employer] Patient  Information 2015 [place], [place]. This information is not intended to replace advice given to you by your health care provider. Make sure you discuss any questions you have with your health care provider.

## 2014-02-27 NOTE — Progress Notes (Signed)
Pre visit review using our clinic review tool, if applicable. No additional management support is needed unless otherwise documented below in the visit note. 

## 2014-02-27 NOTE — Progress Notes (Signed)
Subjective:    Patient ID: Curtis Frazier, male    DOB: 1941-03-07, 73 y.o.   MRN: [id number]  HPI Here for f/u of chronic medical problems   Wt is stable with bmi of 27  K is 5.2- slt elevated Renal insuff Lab Results  Component Value Date   CREATININE 1.5 02/20/2014   BUN 22 02/20/2014   NA 140 02/20/2014   K 5.2* 02/20/2014   CL 108 02/20/2014   CO2 26 02/20/2014   This is stable but not improved as we had hoped   DM Lab Results  Component Value Date   HGBA1C 7.2* 02/20/2014  unchanged from 3 mo  Prednisone status -he is finally off of it (only been off prednisone 2 weeks)  On metformin 1000 mg bid  He is checking some blood sugars - they have improved since coming off prednisone - down 120s-130s now  Not much exercise - he works full time / does some physical work in the house on the weekends  Job is somewhat active   Hyperlipidemia Lab Results  Component Value Date   CHOL 212* 02/20/2014   CHOL 181 11/07/2013   CHOL 189 07/07/2013   Lab Results  Component Value Date   HDL 35.50* 02/20/2014   HDL 39.80 11/07/2013   HDL 39.00* 07/07/2013   Lab Results  Component Value Date   LDLCALC 157* 02/20/2014   LDLCALC 124* 11/07/2013   LDLCALC 129* 07/07/2013   Lab Results  Component Value Date   TRIG 98.0 02/20/2014   TRIG 86.0 11/07/2013   TRIG 105.0 07/07/2013   Lab Results  Component Value Date   CHOLHDL 6 02/20/2014   CHOLHDL 5 11/07/2013   CHOLHDL 5 07/07/2013   Lab Results  Component Value Date   LDLDIRECT 131.3 06/28/2012   LDLDIRECT 149.9 01/19/2012   LDLDIRECT 159.0 04/29/2008   on pravastatin and diet  He is not eating much differently  Not eating greasy or fast food  He has eaten some donuts  He cannot tolerate more pravastatin due to joint and muscle pain     Seeing Dr Alvy Bimler for anemia (multifactorial) It is improved Lab Results  Component Value Date   WBC 7.5 02/20/2014   HGB 11.5* 02/20/2014   HCT 35.5* 02/20/2014   MCV 92.5 02/20/2014   PLT 263.0  02/20/2014    Does not want flu shot today - because he still has some chest congestion with recent uti  Getting remicade infusions now as well  Cut back on methotrexate  Feeling ok     Patient Active Problem List   Diagnosis Date Noted  . Renal insufficiency 11/14/2013  . Encounter for Medicare annual wellness exam 07/07/2013  . BPH (benign prostatic hyperplasia) 07/05/2012  . Prostate cancer screening 06/27/2012  . Low TSH level 01/22/2012  . PMR (polymyalgia rheumatica) 01/20/2012  . Aortic stenosis 07/08/2011  . New onset a-fib 07/05/2011    Class: Acute  . HYPERKALEMIA 02/18/2010  . ESSENTIAL HYPERTENSION, BENIGN 09/13/2009  . ANEMIA-UNSPECIFIED 06/10/2009  . ANGIODYSPLASIA-INTESTINE 06/10/2009  . MURMUR 05/21/2009  . CAROTID BRUIT 05/21/2009  . C A D 03/09/2009  . GERD 03/09/2009  . ARTHRITIS, RHEUMATOID 04/29/2008  . HX, PERSONAL, COLONIC POLYPS 01/17/2007  . DIABETES MELLITUS, TYPE II 09/18/2006  . HYPERLIPIDEMIA 09/18/2006  . PSORIATIC ARTHRITIS 09/18/2006  . PSORIASIS 09/18/2006  . 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 DEGENERATIVE DISC DISEASE 09/18/2006  . SCOLIOSIS 09/18/2006  . TOBACCO ABUSE, HX OF 09/18/2006   Past Medical  History  Diagnosis Date  . Other and unspecified hyperlipidemia   . Coronary atherosclerosis of unspecified type of vessel, native or graft   . Personal history of colonic polyps   . Actinic keratosis   . Psoriatic arthropathy   . Other psoriasis   . Scoliosis (and kyphoscoliosis), idiopathic   . GI bleed 12/10    cecam AVM  . Gastritis 12/10  . Hyperlipidemia     takes Pravastatin daily  . Hypertension     takes Metoprolol daily  . Myocardial infarction 2010  . Shortness of breath     with exertion  . Pneumonia     hx of-2008  . Headache(784.0)     occasionally  . Joint pain   . Joint swelling   . Rheumatoid arthritis(714.0)     takes Methotrexate 7pills weekly  . Degeneration of intervertebral disc, site unspecified   .  Chronic back pain     scoliosis/stenosis/radiculopathy,degenerative disc disease  . Psoriasis   . Bruises easily   . Esophageal reflux     takes Omeprazole daily  . History of colonic polyps   . Hemorrhoids   . Kidney stone     hx of  . Blood transfusion 2010  . Type II or unspecified type diabetes mellitus without mention of complication, not stated as uncontrolled     type II;controlled by diet and exercise  . 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 S/P PTCA (percutaneous transluminal coronary angioplasty)    Past Surgical History  Procedure Laterality Date  . Rotator cuff repair Left 2000  . Colonoscopy    . Vasectomy  1979  . Coronary angioplasty with stent placement  02/2009    has one stent  . Carotid doppler  10/12    0-39% R and 60-79% left   . Cataract extraction  2012  . Cardiac catheterization  2010  . Lithotripsy  2009   History  Substance Use Topics  . Smoking status: Former Research scientist (life sciences)  . Smokeless tobacco: Never Used     Comment: quit 2010  . Alcohol Use: Yes     Comment: occasional   Family History  Problem Relation Age of Onset  . Coronary artery disease Mother   . Diabetes Mother   . CAD Mother   . Coronary artery disease Sister   . Hypertension Sister   . Kidney failure Sister   . CAD Sister   . Pancreatic cancer Brother   . Diabetes Brother   . Prostate cancer Brother   . Pancreatic cancer Brother   . Diabetes Brother   . Anesthesia problems Neg Hx   . Hypotension Neg Hx   . Malignant hyperthermia Neg Hx   . Pseudochol deficiency Neg Hx    Allergies  Allergen Reactions  . Ace Inhibitors     Causes high K  . Angiotensin Receptor Blockers     Causes high K  . Metoprolol     May cause elevated K level  . Penicillins Swelling and Rash  . Tetracycline Swelling and Rash   Current Outpatient Prescriptions on File Prior to Visit  Medication Sig Dispense Refill  . aspirin EC 81 MG tablet Take  81 mg by mouth daily.      . Calcium Carbonate-Vit D-Min (CALCIUM 1200 PO) Take by mouth daily.      . Cholecalciferol 1000 UNITS capsule Take 1,000 Units by mouth daily.        Marland Kitchen  clopidogrel (PLAVIX) 75 MG tablet TAKE 1 TABLET BY MOUTH EVERY DAY  30 tablet  5  . fish oil-omega-3 fatty acids 1000 MG capsule Take 1 g by mouth daily.       Marland Kitchen FOLIC ACID PO Take 1 tablet by mouth daily.       Marland Kitchen glucose blood test strip To check blood sugar for poorly controlled diabetes twice daily and as needed. Dx: 250.00  100 each  3  . guaiFENesin (MUCINEX) 600 MG 12 hr tablet Take by mouth 2 (two) times daily as needed.      . methotrexate (RHEUMATREX) 2.5 MG tablet Take by mouth once a week. 7 TABS WEEKLY ./CY      . nitroGLYCERIN (NITROSTAT) 0.4 MG SL tablet Place 1 tablet (0.4 mg total) under the tongue every 5 (five) minutes as needed. For chest pain.  25 tablet  12  . omeprazole (PRILOSEC) 40 MG capsule Take 40 mg by mouth daily.        . pravastatin (PRAVACHOL) 20 MG tablet TAKE 1 TABLET BY MOUTH DAILY  30 tablet  0  . predniSONE (DELTASONE) 5 MG tablet        No current facility-administered medications on file prior to visit.    Review of Systems Review of Systems  Constitutional: Negative for fever, appetite change, fatigue and unexpected weight change.  Eyes: Negative for pain and visual disturbance.  Respiratory: Negative for cough and shortness of breath.   Cardiovascular: Negative for cp or palpitations    Gastrointestinal: Negative for nausea, diarrhea and constipation.  Genitourinary: Negative for urgency and frequency.  Skin: Negative for pallor or rash   MSK pos for joint pain  Neurological: Negative for weakness, light-headedness, numbness and headaches.  Hematological: Negative for adenopathy. Does not bruise/bleed easily.  Psychiatric/Behavioral: Negative for dysphoric mood. The patient is not nervous/anxious.         Objective:   Physical Exam  Constitutional: He appears  well-developed and well-nourished. No distress.  HENT:  Head: Normocephalic and atraumatic.  Mouth/Throat: Oropharynx is clear and moist.  Eyes: Conjunctivae and EOM are normal. Pupils are equal, round, and reactive to light. Right eye exhibits no discharge. Left eye exhibits no discharge. No scleral icterus.  Neck: Normal range of motion. Neck supple. No JVD present. Carotid bruit is not present. No thyromegaly present.  Cardiovascular: Normal rate, regular rhythm, normal heart sounds and intact distal pulses.  Exam reveals no gallop.   Pulmonary/Chest: Effort normal and breath sounds normal. No respiratory distress. He has no wheezes.  Abdominal: Soft. Bowel sounds are normal. He exhibits no distension, no abdominal bruit and no mass. There is no tenderness.  Musculoskeletal: He exhibits no edema.  Lymphadenopathy:    He has no cervical adenopathy.  Neurological: He is alert. He has normal reflexes. No cranial nerve deficit. He exhibits normal muscle tone. Coordination normal.  Skin: Skin is warm and dry. No rash noted. No pallor.  Psychiatric: He has a normal mood and affect.          Assessment & Plan:   Problem List Items Addressed This Visit     Endocrine   DIABETES MELLITUS, TYPE II - Primary      Lab Results  Component Value Date   HGBA1C 7.2* 02/20/2014   Not as much improvement as expected off prednisone-but it has only been 2 weeks and he sees improvement at home in glucose readings With cr of 1.5 - will hold metformin  Transition to  glipizide er as tolerated Disc s/s of hypoglycemia and imp of no skipped meals  F/u planned for renal labs and rev glucose readings  He may ultimately need to disc injectable med if he can afford     Relevant Medications      glipiZIDE (GLUCOTROL XL) 24 hr tablet     Genitourinary   Renal insufficiency     Cr is still 1.5  Will hold metformin and re check Suspect multifactorial Disc imp of good fluid intake       Other    HYPERLIPIDEMIA     Disc goals for lipids and reasons to control them Rev labs with pt Rev low sat fat diet in detail He is not at goal but at the top dose of statin he can tolerate emph imp of diet

## 2014-03-01 NOTE — Assessment & Plan Note (Signed)
Disc goals for lipids and reasons to control them Rev labs with pt Rev low sat fat diet in detail He is not at goal but at the top dose of statin he can tolerate emph imp of diet

## 2014-03-01 NOTE — Assessment & Plan Note (Signed)
Lab Results  Component Value Date   HGBA1C 7.2* 02/20/2014   Not as much improvement as expected off prednisone-but it has only been 2 weeks and he sees improvement at home in glucose readings With cr of 1.5 - will hold metformin  Transition to glipizide er as tolerated Disc s/s of hypoglycemia and imp of no skipped meals  F/u planned for renal labs and rev glucose readings  He may ultimately need to disc injectable med if he can afford

## 2014-03-01 NOTE — Assessment & Plan Note (Signed)
Cr is still 1.5  Will hold metformin and re check Suspect multifactorial Disc imp of good fluid intake

## 2014-03-11 ENCOUNTER — Telehealth: Payer: Self-pay | Admitting: Family Medicine

## 2014-03-11 DIAGNOSIS — N289 Disorder of kidney and ureter, unspecified: Secondary | ICD-10-CM

## 2014-03-11 DIAGNOSIS — I10 Essential (primary) hypertension: Secondary | ICD-10-CM

## 2014-03-11 NOTE — Telephone Encounter (Signed)
Message copied by Abner Greenspan on Wed Mar 11, 2014 10:28 PM ------      Message from: Ellamae Sia      Created: Thu Mar 05, 2014  2:57 PM      Regarding: Lab orders for FRIDAY, 10.2.15       Lab for a 3 weeks f/u ------

## 2014-03-13 ENCOUNTER — Other Ambulatory Visit (INDEPENDENT_AMBULATORY_CARE_PROVIDER_SITE_OTHER): Payer: Medicare Other

## 2014-03-13 DIAGNOSIS — N289 Disorder of kidney and ureter, unspecified: Secondary | ICD-10-CM

## 2014-03-13 LAB — BASIC METABOLIC PANEL
BUN: 14 mg/dL (ref 6–23)
CHLORIDE: 113 meq/L — AB (ref 96–112)
CO2: 23 mEq/L (ref 19–32)
CREATININE: 1.4 mg/dL (ref 0.4–1.5)
Calcium: 8.7 mg/dL (ref 8.4–10.5)
GFR: 53.89 mL/min — ABNORMAL LOW (ref >60.00)
Glucose, Bld: 120 mg/dL — ABNORMAL HIGH (ref 70–99)
POTASSIUM: 4.2 meq/L (ref 3.5–5.1)
Sodium: 141 mEq/L (ref 135–145)

## 2014-03-20 ENCOUNTER — Encounter: Payer: Self-pay | Admitting: Family Medicine

## 2014-03-20 ENCOUNTER — Ambulatory Visit (INDEPENDENT_AMBULATORY_CARE_PROVIDER_SITE_OTHER): Payer: Medicare Other | Admitting: Family Medicine

## 2014-03-20 VITALS — BP 126/62 | HR 67 | Temp 98.5°F | Ht 68.0 in | Wt 182.5 lb

## 2014-03-20 DIAGNOSIS — E119 Type 2 diabetes mellitus without complications: Secondary | ICD-10-CM

## 2014-03-20 DIAGNOSIS — N289 Disorder of kidney and ureter, unspecified: Secondary | ICD-10-CM

## 2014-03-20 DIAGNOSIS — I2510 Atherosclerotic heart disease of native coronary artery without angina pectoris: Secondary | ICD-10-CM

## 2014-03-20 NOTE — Patient Instructions (Signed)
Please start checking some glucose readings in the afternoon (2 hours after lunch) or the evening (2 hours after supper) In the next 2-4 weeks please mail me a copy of those readings  Try to stick to a diabetic diet  No change in medicines  Kidney labs look a bit better   Follow up in 3 months with labs prior

## 2014-03-20 NOTE — Progress Notes (Signed)
Subjective:    Patient ID: Curtis Frazier, male    DOB: [birth year], 73 y.o.   MRN: [id number]  HPI Here for f/u of DM and renal insuff   He changed from metformin to glipizide   Am glucose is good - 120 or below and no low  One afternoon glucose - 94 (after not eating for a while)  He has not had any episodes of hypoglycemia  Only checked one PM glucose -- schedule does not allow it   Eats supper 6-7 - goes to bed at 8:30 to 9   Renal insuff Lab Results  Component Value Date   CREATININE 1.4 03/13/2014   BUN 14 03/13/2014   NA 141 03/13/2014   K 4.2 03/13/2014   CL 113* 03/13/2014   CO2 23 03/13/2014    He is on an infusion now for his psoriatic arthritis  remicade Weaning off of methotrexate (which can be renal toxic) Since coming off the prednisone - his hand joints and hips do hurt more    Patient Active Problem List   Diagnosis Date Noted  . Renal insufficiency 11/14/2013  . Encounter for Medicare annual wellness exam 07/07/2013  . BPH (benign prostatic hyperplasia) 07/05/2012  . Prostate cancer screening 06/27/2012  . Low TSH level 01/22/2012  . PMR (polymyalgia rheumatica) 01/20/2012  . Aortic stenosis 07/08/2011  . New onset a-fib 07/05/2011    Class: Acute  . HYPERKALEMIA 02/18/2010  . ESSENTIAL HYPERTENSION, BENIGN 09/13/2009  . ANEMIA-UNSPECIFIED 06/10/2009  . ANGIODYSPLASIA-INTESTINE 06/10/2009  . MURMUR 05/21/2009  . CAROTID BRUIT 05/21/2009  . C A D 03/09/2009  . GERD 03/09/2009  . ARTHRITIS, RHEUMATOID 04/29/2008  . HX, PERSONAL, COLONIC POLYPS 01/17/2007  . Diabetes mellitus type 2, controlled, without complications 77/93/9030  . HYPERLIPIDEMIA 09/18/2006  . PSORIATIC ARTHRITIS 09/18/2006  . PSORIASIS 09/18/2006  . 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 DEGENERATIVE DISC DISEASE 09/18/2006  . SCOLIOSIS 09/18/2006  . TOBACCO ABUSE, HX OF 09/18/2006   Past Medical History  Diagnosis Date  . Other and unspecified hyperlipidemia   . Coronary  atherosclerosis of unspecified type of vessel, native or graft   . Personal history of colonic polyps   . Actinic keratosis   . Psoriatic arthropathy   . Other psoriasis   . Scoliosis (and kyphoscoliosis), idiopathic   . GI bleed 12/10    cecam AVM  . Gastritis 12/10  . Hyperlipidemia     takes Pravastatin daily  . Hypertension     takes Metoprolol daily  . Myocardial infarction 2010  . Shortness of breath     with exertion  . Pneumonia     hx of-2008  . Headache(784.0)     occasionally  . Joint pain   . Joint swelling   . Rheumatoid arthritis(714.0)     takes Methotrexate 7pills weekly  . Degeneration of intervertebral disc, site unspecified   . Chronic back pain     scoliosis/stenosis/radiculopathy,degenerative disc disease  . Psoriasis   . Bruises easily   . Esophageal reflux     takes Omeprazole daily  . History of colonic polyps   . Hemorrhoids   . Kidney stone     hx of  . Blood transfusion 2010  . Type II or unspecified type diabetes mellitus without mention of complication, not stated as uncontrolled     type II;controlled by diet and exercise  . 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 S/P  PTCA (percutaneous transluminal coronary angioplasty)    Past Surgical History  Procedure Laterality Date  . Rotator cuff repair Left 2000  . Colonoscopy    . Vasectomy  1979  . Coronary angioplasty with stent placement  02/2009    has one stent  . Carotid doppler  10/12    0-39% R and 60-79% left   . Cataract extraction  2012  . Cardiac catheterization  2010  . Lithotripsy  2009   History  Substance Use Topics  . Smoking status: Former Research scientist (life sciences)  . Smokeless tobacco: Never Used     Comment: quit 2010  . Alcohol Use: Yes     Comment: occasional   Family History  Problem Relation Age of Onset  . Coronary artery disease Mother   . Diabetes Mother   . CAD Mother   . Coronary artery disease Sister   . Hypertension  Sister   . Kidney failure Sister   . CAD Sister   . Pancreatic cancer Brother   . Diabetes Brother   . Prostate cancer Brother   . Pancreatic cancer Brother   . Diabetes Brother   . Anesthesia problems Neg Hx   . Hypotension Neg Hx   . Malignant hyperthermia Neg Hx   . Pseudochol deficiency Neg Hx    Allergies  Allergen Reactions  . Ace Inhibitors     Causes high K  . Angiotensin Receptor Blockers     Causes high K  . Metoprolol     May cause elevated K level  . Penicillins Swelling and Rash  . Tetracycline Swelling and Rash   Current Outpatient Prescriptions on File Prior to Visit  Medication Sig Dispense Refill  . aspirin EC 81 MG tablet Take 81 mg by mouth daily.      . Calcium Carbonate-Vit D-Min (CALCIUM 1200 PO) Take by mouth daily.      . Cholecalciferol 1000 UNITS capsule Take 1,000 Units by mouth daily.        . clopidogrel (PLAVIX) 75 MG tablet TAKE 1 TABLET BY MOUTH EVERY DAY  30 tablet  5  . fish oil-omega-3 fatty acids 1000 MG capsule Take 1 g by mouth daily.       Marland Kitchen FOLIC ACID PO Take 1 tablet by mouth daily.       Marland Kitchen glipiZIDE (GLUCOTROL XL) 5 MG 24 hr tablet Take 1 tablet (5 mg total) by mouth daily with breakfast.  30 tablet  3  . glucose blood test strip To check blood sugar for poorly controlled diabetes twice daily and as needed. Dx: 250.00  100 each  3  . guaiFENesin (MUCINEX) 600 MG 12 hr tablet Take by mouth 2 (two) times daily as needed.      . methotrexate (RHEUMATREX) 2.5 MG tablet Take by mouth once a week. 4 TABS WEEKLY ./CY      . nitroGLYCERIN (NITROSTAT) 0.4 MG SL tablet Place 1 tablet (0.4 mg total) under the tongue every 5 (five) minutes as needed. For chest pain.  25 tablet  12  . omeprazole (PRILOSEC) 40 MG capsule Take 40 mg by mouth daily.        . pravastatin (PRAVACHOL) 20 MG tablet TAKE 1 TABLET BY MOUTH DAILY  30 tablet  0   No current facility-administered medications on file prior to visit.     Review of Systems Review of Systems    Constitutional: Negative for fever, appetite change, fatigue and unexpected weight change.  Eyes: Negative for pain and  visual disturbance.  Respiratory: Negative for cough and shortness of breath.   Cardiovascular: Negative for cp or palpitations    Gastrointestinal: Negative for nausea, diarrhea and constipation.  Genitourinary: Negative for urgency and frequency.  Skin: Negative for pallor and pos for psoriasis  MSK pos for joint pain baseline  Neurological: Negative for weakness, light-headedness, numbness and headaches.  Hematological: Negative for adenopathy. Does not bruise/bleed easily.  Psychiatric/Behavioral: Negative for dysphoric mood. The patient is not nervous/anxious.         Objective:   Physical Exam  Constitutional: He appears well-developed and well-nourished. No distress.  HENT:  Head: Normocephalic and atraumatic.  Mouth/Throat: Oropharynx is clear and moist.  Eyes: Conjunctivae and EOM are normal. Pupils are equal, round, and reactive to light. Right eye exhibits no discharge. Left eye exhibits no discharge. No scleral icterus.  Neck: Normal range of motion. Neck supple. No JVD present. Carotid bruit is present. No thyromegaly present.  Cardiovascular: Normal rate, regular rhythm and intact distal pulses.  Exam reveals no gallop.   Murmur heard. Pulmonary/Chest: Effort normal and breath sounds normal. No respiratory distress. He has no wheezes. He has no rales.  Diffusely distant bs   Abdominal: Soft. Bowel sounds are normal.  Musculoskeletal: He exhibits no edema.  Lymphadenopathy:    He has no cervical adenopathy.  Neurological: He is alert. He has normal reflexes.  Skin: Skin is warm and dry. No rash noted. No erythema. No pallor.  Psychiatric: He has a normal mood and affect.          Assessment & Plan:   Problem List Items Addressed This Visit     Endocrine   Diabetes mellitus type 2, controlled, without complications - Primary     Pt takes  glipizide now - nl am glucose /no lows  Will begin checking pm glucose and send me a copy in 2-4 weeks Alert if low sugars Stick to DM diet  F/u 3 mo with lab prior     Relevant Orders      Hemoglobin A1c     Genitourinary   Renal insufficiency      Improved without the metformin Lab Results  Component Value Date   CREATININE 1.4 03/13/2014    Also weaning off methotrexate   Will check for 3 mo lab     Relevant Orders      Basic metabolic panel

## 2014-03-20 NOTE — Assessment & Plan Note (Signed)
Pt takes glipizide now - nl am glucose /no lows  Will begin checking pm glucose and send me a copy in 2-4 weeks Alert if low sugars Stick to DM diet  F/u 3 mo with lab prior

## 2014-03-20 NOTE — Progress Notes (Signed)
Pre visit review using our clinic review tool, if applicable. No additional management support is needed unless otherwise documented below in the visit note. 

## 2014-03-20 NOTE — Assessment & Plan Note (Signed)
Improved without the metformin Lab Results  Component Value Date   CREATININE 1.4 03/13/2014    Also weaning off methotrexate   Will check for 3 mo lab

## 2014-03-25 ENCOUNTER — Other Ambulatory Visit: Payer: Self-pay | Admitting: Family Medicine

## 2014-03-25 NOTE — Telephone Encounter (Signed)
done

## 2014-03-25 NOTE — Telephone Encounter (Signed)
Pt had OV and labs 02/27/14--due to OV note unsure if pt is to continue this medication as prescribed--please advise if okay to refill

## 2014-03-25 NOTE — Telephone Encounter (Signed)
Please refill for a year  

## 2014-03-26 ENCOUNTER — Other Ambulatory Visit: Payer: Self-pay | Admitting: Family Medicine

## 2014-03-28 ENCOUNTER — Other Ambulatory Visit: Payer: Self-pay | Admitting: Family Medicine

## 2014-04-16 ENCOUNTER — Other Ambulatory Visit: Payer: Self-pay | Admitting: Family Medicine

## 2014-04-17 ENCOUNTER — Other Ambulatory Visit: Payer: Self-pay

## 2014-04-17 MED ORDER — GLUCOSE BLOOD VI STRP: ORAL_STRIP | Status: DC

## 2014-04-17 MED ORDER — BAYER CONTOUR MONITOR W/DEVICE KIT: PACK | Status: DC

## 2014-04-17 MED ORDER — ONETOUCH ULTRA MINI W/DEVICE KIT: PACK | Status: DC

## 2014-04-17 NOTE — Telephone Encounter (Signed)
pts ins will cover one touch ultra mini and strips; sent to [place]. Pt advised. Pt is out of strips.

## 2014-04-17 NOTE — Telephone Encounter (Signed)
Pt's Bayer contour meter stopped working and pt request another Conservator, museum[employer]gallery sent to [employer]. Advised pt done.

## 2014-05-01 ENCOUNTER — Encounter: Payer: Self-pay | Admitting: Cardiovascular Disease

## 2014-05-01 ENCOUNTER — Ambulatory Visit (INDEPENDENT_AMBULATORY_CARE_PROVIDER_SITE_OTHER): Payer: Medicare Other | Admitting: Cardiovascular Disease

## 2014-05-01 VITALS — BP 122/60 | HR 52 | Ht 68.0 in | Wt 183.0 lb

## 2014-05-01 DIAGNOSIS — E785 Hyperlipidemia, unspecified: Secondary | ICD-10-CM

## 2014-05-01 DIAGNOSIS — R010 Benign and innocent cardiac murmurs: Secondary | ICD-10-CM

## 2014-05-01 DIAGNOSIS — R0989 Other specified symptoms and signs involving the circulatory and respiratory systems: Secondary | ICD-10-CM

## 2014-05-01 DIAGNOSIS — I2510 Atherosclerotic heart disease of native coronary artery without angina pectoris: Secondary | ICD-10-CM

## 2014-05-01 DIAGNOSIS — I10 Essential (primary) hypertension: Secondary | ICD-10-CM

## 2014-05-01 DIAGNOSIS — R011 Cardiac murmur, unspecified: Secondary | ICD-10-CM

## 2014-05-01 NOTE — Progress Notes (Signed)
Patient ID: Curtis Frazier, male   DOB: January 15, 1941, 73 y.o.   MRN: [id number] Curtis Frazier is seen today For F/U of CAD, carotid disease. He has a BMS in his circ from [place] in 02/2009. GI bleed 2014 from AVM.  1/13 had lumbar surgery with PAF in setting of post op anemia and pain. Converted with amiodarone and continues on only beta blocker now On prednisone taper for PMR This has improved his hip and knee pain. No TIA symptoms. Continue antiplatelet Rx   Low risk myovue 12/14  Overall Impression: This is a low risk scan. There is excellent wall motion. I cannot rule out the possibility of slight ischemia at the apical cap, but this is always a borderline call. There are no large areas of scar or ischemia.  LV Ejection Fraction: 71%. LV Wall Motion: Normal Wall Motion.   0/01  LICA 74-94%  Needs f/u duplex 6/16   Allergic to ARB/ACE  On remicade for psoriatic arthritis Trying to taper prednisone and methotrexate   Has AS with heavily calcified valve Reviewed echo 4/15 and still mild AS despite morphology with mean gradient going from only 14 to 18 mmHg and peak 32 mmHg Needs refill on nitro  No chest pain    ROS: Denies fever, malais, weight loss, blurry vision, decreased visual acuity, cough, sputum, SOB, hemoptysis, pleuritic pain, palpitaitons, heartburn, abdominal pain, melena, lower extremity edema, claudication, or rash.  All other systems reviewed and negative  General: Affect appropriate Healthy:  appears stated age HEENT: normal Neck supple with no adenopathy JVP normal bilateral l bruits no thyromegaly Lungs clear with no wheezing and good diaphragmatic motion Heart:  S1/S2 AS  murmur, no rub, gallop or click PMI normal Abdomen: benighn, BS positve, no tenderness, no AAA no bruit.  No HSM or HJR Distal pulses intact with no bruits No edema Neuro non-focal Skin warm and dry No muscular weakness   Current Outpatient Prescriptions  Medication Sig Dispense Refill  . aspirin EC 81  MG tablet Take 81 mg by mouth daily.    . Blood Glucose Monitoring Suppl (ONE TOUCH ULTRA MINI) W/DEVICE KIT 1Pt checks blood sugar twice daily and as directed. Dx E11.9. 1 each 0  . Calcium Carbonate-Vit D-Min (CALCIUM 1200 PO) Take by mouth daily.    . Cholecalciferol 1000 UNITS capsule Take 1,000 Units by mouth daily.      . clopidogrel (PLAVIX) 75 MG tablet TAKE 1 TABLET BY MOUTH EVERY DAY 30 tablet 1  . fish oil-omega-3 fatty acids 1000 MG capsule Take 1 g by mouth daily.     Marland Kitchen FOLIC ACID PO Take 1 tablet by mouth daily.     Marland Kitchen glipiZIDE (GLUCOTROL XL) 5 MG 24 hr tablet Take 1 tablet (5 mg total) by mouth daily with breakfast. 30 tablet 3  . glucose blood (ONE TOUCH ULTRA TEST) test strip Check blood sugar twice a day and as directed. E11.9 100 each 5  . guaiFENesin (MUCINEX) 600 MG 12 hr tablet Take by mouth 2 (two) times daily as needed.    . methotrexate (RHEUMATREX) 2.5 MG tablet Take by mouth once a week. 4 TABS WEEKLY ./CY    . metoprolol succinate (TOPROL-XL) 25 MG 24 hr tablet TAKE 1 TABLET (25 MG TOTAL) BY MOUTH DAILY. 90 tablet 1  . nitroGLYCERIN (NITROSTAT) 0.4 MG SL tablet Place 1 tablet (0.4 mg total) under the tongue every 5 (five) minutes as needed. For chest pain. 25 tablet 12  . omeprazole (PRILOSEC)  40 MG capsule Take 40 mg by mouth daily.      . pravastatin (PRAVACHOL) 20 MG tablet TAKE 1 TABLET BY MOUTH DAILY 30 tablet 11   No current facility-administered medications for this visit.    Allergies  Ace inhibitors; Angiotensin receptor blockers; Metoprolol; Penicillins; and Tetracycline  Electrocardiogram: 10/01/12  SR rate 62 LAE otherwise normal  todayh SB rate 52 normal no change from 2014   Assessment and Plan

## 2014-05-01 NOTE — Assessment & Plan Note (Signed)
Cholesterol is not  at goal.  Increase pravastatin to 20 mg   No myalgias or side effects.  F/U  LFT's in 6 months. Lab Results  Component Value Date   LDLCALC 157* 02/20/2014

## 2014-05-01 NOTE — Assessment & Plan Note (Signed)
Known moderate disease ASA no symptoms  Duplex 4/16

## 2014-05-01 NOTE — Assessment & Plan Note (Signed)
Morphologically heavily calcified valve but only mild stenosis  S2 preserved  Echo 4/16

## 2014-05-01 NOTE — Patient Instructions (Signed)
Your physician wants you to follow-up in:   April WITH DR  Johnsie Cancel  ECHO  AND  CAROTID  SAME DAY   You will receive a reminder letter in the mail two months in advance. If you don't receive a letter, please call our office to schedule the follow-up appointment.  Your physician recommends that you continue on your current medications as directed. Please refer to the Current Medication list given to you today. Your physician has requested that you have an echocardiogram. Echocardiography is a painless test that uses sound waves to create images of your heart. It provides your doctor with information about the size and shape of your heart and how well your heart's chambers and valves are working. This procedure takes approximately one hour. There are no restrictions for this procedure. Your physician has requested that you have a carotid duplex. This test is an ultrasound of the carotid arteries in your neck. It looks at blood flow through these arteries that supply the brain with blood. Allow one hour for this exam. There are no restrictions or special instructions.

## 2014-05-01 NOTE — Assessment & Plan Note (Signed)
Well controlled.  Continue current medications and low sodium Dash type diet.    

## 2014-05-01 NOTE — Assessment & Plan Note (Signed)
Stable with no angina and good activity level.  Continue medical Rx New nitro called into [facility] [address]

## 2014-06-10 ENCOUNTER — Ambulatory Visit: Payer: Medicare Other | Admitting: Family Medicine

## 2014-06-12 ENCOUNTER — Other Ambulatory Visit: Payer: Self-pay | Admitting: Family Medicine

## 2014-06-18 ENCOUNTER — Other Ambulatory Visit: Payer: Self-pay | Admitting: Family Medicine

## 2014-06-19 ENCOUNTER — Other Ambulatory Visit (INDEPENDENT_AMBULATORY_CARE_PROVIDER_SITE_OTHER): Payer: Medicare Other

## 2014-06-19 DIAGNOSIS — N289 Disorder of kidney and ureter, unspecified: Secondary | ICD-10-CM

## 2014-06-19 DIAGNOSIS — E119 Type 2 diabetes mellitus without complications: Secondary | ICD-10-CM

## 2014-06-19 LAB — BASIC METABOLIC PANEL
BUN: 19 mg/dL (ref 6–23)
CHLORIDE: 109 meq/L (ref 96–112)
CO2: 28 mEq/L (ref 19–32)
Calcium: 9.5 mg/dL (ref 8.4–10.5)
Creatinine, Ser: 1.3 mg/dL (ref 0.4–1.5)
GFR: 58.77 mL/min — ABNORMAL LOW (ref >60.00)
Glucose, Bld: 141 mg/dL — ABNORMAL HIGH (ref 70–99)
Potassium: 5.1 mEq/L (ref 3.5–5.1)
SODIUM: 143 meq/L (ref 135–145)

## 2014-06-19 LAB — HEMOGLOBIN A1C: HEMOGLOBIN A1C: 7.1 % — AB (ref 4.6–6.5)

## 2014-06-26 ENCOUNTER — Ambulatory Visit (INDEPENDENT_AMBULATORY_CARE_PROVIDER_SITE_OTHER): Payer: Medicare Other | Admitting: Family Medicine

## 2014-06-26 ENCOUNTER — Encounter: Payer: Self-pay | Admitting: Family Medicine

## 2014-06-26 ENCOUNTER — Ambulatory Visit (INDEPENDENT_AMBULATORY_CARE_PROVIDER_SITE_OTHER): Payer: Self-pay | Admitting: Emergency Medicine

## 2014-06-26 VITALS — BP 130/62 | HR 52 | Temp 98.4°F | Ht 68.0 in | Wt 183.8 lb

## 2014-06-26 VITALS — BP 146/72 | HR 83 | Temp 98.7°F | Resp 16 | Ht 68.0 in | Wt 182.0 lb

## 2014-06-26 DIAGNOSIS — Z021 Encounter for pre-employment examination: Secondary | ICD-10-CM

## 2014-06-26 DIAGNOSIS — I10 Essential (primary) hypertension: Secondary | ICD-10-CM

## 2014-06-26 DIAGNOSIS — Z23 Encounter for immunization: Secondary | ICD-10-CM

## 2014-06-26 DIAGNOSIS — N289 Disorder of kidney and ureter, unspecified: Secondary | ICD-10-CM

## 2014-06-26 DIAGNOSIS — E119 Type 2 diabetes mellitus without complications: Secondary | ICD-10-CM

## 2014-06-26 NOTE — Progress Notes (Signed)
Subjective:    Patient ID: Curtis Frazier, male    DOB: 1941/06/07, 74 y.o.   MRN: [id number]  HPI Here for f/u of chronic health problems  Doing pretty well overall Had a fall at work - he was pulling something - injured his hip- is slowly getting better  He went to orthopedics - told it was inflamed  Then Dr Marijean Bravo put a cortisone shot in joint   Wt is stable with bmi of 27  bp is stable today  No cp or palpitations or headaches or edema  No side effects to medicines  BP Readings from Last 3 Encounters:  06/26/14 130/62  05/01/14 122/60  03/20/14 126/62     Diabetes Home sugar results (went up just a bit after cortisone shot)  In am usually 110-120 , does not check PP,  occ checks at night - one day 90 , one day 78  DM diet - is fairly good/ he does watch it -cut back on sugar and starches  Exercise - a lot of walking at work- that bothers his hips  Symptoms A1C last  Lab Results  Component Value Date   HGBA1C 7.1* 06/19/2014  down from 7.2   No problems with medications -on glipizide xl Renal protection- cannot have ace  Last eye exam    On remicade Still on methotrexate -cut to 4 pills per week and then went up to 6  Still taking prednisone - put him back on 5 mg    Renal insuff Lab Results  Component Value Date   CREATININE 1.3 06/19/2014   BUN 19 06/19/2014   NA 143 06/19/2014   K 5.1 06/19/2014   CL 109 06/19/2014   CO2 28 06/19/2014   this has improved  ? If due to orignial cut in methotrexate  Avoiding renal toxic meds  Trying to drink more water  No change in urination   Patient Active Problem List   Diagnosis Date Noted  . Renal insufficiency 11/14/2013  . Encounter for Medicare annual wellness exam 07/07/2013  . BPH (benign prostatic hyperplasia) 07/05/2012  . Prostate cancer screening 06/27/2012  . Low TSH level 01/22/2012  . PMR (polymyalgia rheumatica) 01/20/2012  . Aortic stenosis 07/08/2011  . New onset a-fib 07/05/2011    Class:  Acute  . HYPERKALEMIA 02/18/2010  . ESSENTIAL HYPERTENSION, BENIGN 09/13/2009  . ANEMIA-UNSPECIFIED 06/10/2009  . ANGIODYSPLASIA-INTESTINE 06/10/2009  . MURMUR 05/21/2009  . Left carotid bruit 05/21/2009  . Coronary atherosclerosis 03/09/2009  . GERD 03/09/2009  . ARTHRITIS, RHEUMATOID 04/29/2008  . HX, PERSONAL, COLONIC POLYPS 01/17/2007  . Diabetes mellitus type 2, controlled, without complications 79/39/0300  . Elevated lipids 09/18/2006  . PSORIATIC ARTHRITIS 09/18/2006  . PSORIASIS 09/18/2006  . 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 DEGENERATIVE DISC DISEASE 09/18/2006  . SCOLIOSIS 09/18/2006  . TOBACCO ABUSE, HX OF 09/18/2006   Past Medical History  Diagnosis Date  . Other and unspecified hyperlipidemia   . Coronary atherosclerosis of unspecified type of vessel, native or graft   . Personal history of colonic polyps   . Actinic keratosis   . Psoriatic arthropathy   . Other psoriasis   . Scoliosis (and kyphoscoliosis), idiopathic   . GI bleed 12/10    cecam AVM  . Gastritis 12/10  . Hyperlipidemia     takes Pravastatin daily  . Hypertension     takes Metoprolol daily  . Myocardial infarction 2010  . Shortness of breath     with exertion  .  Pneumonia     hx of-2008  . Headache(784.0)     occasionally  . Joint pain   . Joint swelling   . Rheumatoid arthritis(714.0)     takes Methotrexate 7pills weekly  . Degeneration of intervertebral disc, site unspecified   . Chronic back pain     scoliosis/stenosis/radiculopathy,degenerative disc disease  . Psoriasis   . Bruises easily   . Esophageal reflux     takes Omeprazole daily  . History of colonic polyps   . Hemorrhoids   . Kidney stone     hx of  . Blood transfusion 2010  . Type II or unspecified type diabetes mellitus without mention of complication, not stated as uncontrolled     type II;controlled by diet and exercise  . 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  S/P PTCA (percutaneous transluminal coronary angioplasty)    Past Surgical History  Procedure Laterality Date  . Rotator cuff repair Left 2000  . Colonoscopy    . Vasectomy  1979  . Coronary angioplasty with stent placement  02/2009    has one stent  . Carotid doppler  10/12    0-39% R and 60-79% left   . Cataract extraction  2012  . Cardiac catheterization  2010  . Lithotripsy  2009   History  Substance Use Topics  . Smoking status: Former Research scientist (life sciences)  . Smokeless tobacco: Never Used     Comment: quit 2010  . Alcohol Use: 0.0 oz/week    0 Not specified per week     Comment: occasional   Family History  Problem Relation Age of Onset  . Coronary artery disease Mother   . Diabetes Mother   . CAD Mother   . Coronary artery disease Sister   . Hypertension Sister   . Kidney failure Sister   . CAD Sister   . Pancreatic cancer Brother   . Diabetes Brother   . Prostate cancer Brother   . Pancreatic cancer Brother   . Diabetes Brother   . Anesthesia problems Neg Hx   . Hypotension Neg Hx   . Malignant hyperthermia Neg Hx   . Pseudochol deficiency Neg Hx    Allergies  Allergen Reactions  . Ace Inhibitors     Causes high K  . Angiotensin Receptor Blockers     Causes high K  . Metoprolol     May cause elevated K level  . Penicillins Swelling and Rash  . Tetracycline Swelling and Rash   Current Outpatient Prescriptions on File Prior to Visit  Medication Sig Dispense Refill  . aspirin EC 81 MG tablet Take 81 mg by mouth daily.    . Blood Glucose Monitoring Suppl (ONE TOUCH ULTRA MINI) W/DEVICE KIT 1Pt checks blood sugar twice daily and as directed. Dx E11.9. 1 each 0  . Calcium Carbonate-Vit D-Min (CALCIUM 1200 PO) Take by mouth daily.    . Cholecalciferol 1000 UNITS capsule Take 1,000 Units by mouth daily.      . clopidogrel (PLAVIX) 75 MG tablet TAKE 1 TABLET BY MOUTH EVERY DAY 30 tablet 0  . fish oil-omega-3 fatty acids 1000 MG capsule Take 1 g by mouth daily.     Marland Kitchen  FLUZONE HIGH-DOSE 0.5 ML SUSY   0  . FOLIC ACID PO Take 1 tablet by mouth daily.     Marland Kitchen glipiZIDE (GLUCOTROL XL) 5 MG 24 hr tablet TAKE 1 TABLET BY MOUTH EVERY DAY WITH BREAKFAST  30 tablet 0  . glucose blood (ONE TOUCH ULTRA TEST) test strip Check blood sugar twice a day and as directed. E11.9 100 each 5  . guaiFENesin (MUCINEX) 600 MG 12 hr tablet Take 600 mg by mouth as needed.     . methotrexate (RHEUMATREX) 2.5 MG tablet Take by mouth once a week. 4 TABS WEEKLY ./CY    . metoprolol succinate (TOPROL-XL) 25 MG 24 hr tablet TAKE 1 TABLET (25 MG TOTAL) BY MOUTH DAILY. 90 tablet 1  . nitroGLYCERIN (NITROSTAT) 0.4 MG SL tablet Place 1 tablet (0.4 mg total) under the tongue every 5 (five) minutes as needed. For chest pain. 25 tablet 12  . omeprazole (PRILOSEC) 40 MG capsule Take 40 mg by mouth daily.      . pravastatin (PRAVACHOL) 20 MG tablet TAKE 1 TABLET BY MOUTH DAILY 30 tablet 11  . predniSONE (DELTASONE) 5 MG tablet Take 5 mg by mouth daily.      No current facility-administered medications on file prior to visit.      Review of Systems Review of Systems  Constitutional: Negative for fever, appetite change, fatigue and unexpected weight change.  Eyes: Negative for pain and visual disturbance.  Respiratory: Negative for cough and shortness of breath.   Cardiovascular: Negative for cp or palpitations    Gastrointestinal: Negative for nausea, diarrhea and constipation.  Genitourinary: Negative for urgency and frequency.  Skin: Negative for pallor or rash   MSK pos for joint pain and muscle pain from PMR as wel as arthritis  Neurological: Negative for weakness, light-headedness, numbness and headaches.  Hematological: Negative for adenopathy. Does not bruise/bleed easily.  Psychiatric/Behavioral: Negative for dysphoric mood. The patient is not nervous/anxious.         Objective:   Physical Exam  Constitutional: He appears well-developed and well-nourished. No distress.  Well  appearing elderly male   HENT:  Head: Normocephalic and atraumatic.  Right Ear: External ear normal.  Left Ear: External ear normal.  Nose: Nose normal.  Mouth/Throat: Oropharynx is clear and moist.  Eyes: Conjunctivae and EOM are normal. Pupils are equal, round, and reactive to light. Right eye exhibits no discharge. Left eye exhibits no discharge. No scleral icterus.  Neck: Normal range of motion. Neck supple. No JVD present. Carotid bruit is present. No thyromegaly present.  Cardiovascular: Normal rate, regular rhythm and intact distal pulses.  Exam reveals no gallop.   Murmur heard. Pulmonary/Chest: Effort normal and breath sounds normal. No respiratory distress. He has no wheezes. He exhibits no tenderness.  No crackles   Abdominal: Soft. Bowel sounds are normal. He exhibits no distension, no abdominal bruit and no mass. There is no tenderness.  Musculoskeletal: He exhibits no edema or tenderness.  Limited rom hips and knees  Lymphadenopathy:    He has no cervical adenopathy.  Neurological: He is alert. He has normal reflexes. No cranial nerve deficit. He exhibits normal muscle tone. Coordination normal.  Skin: Skin is warm and dry. No rash noted. No erythema. No pallor.  Psychiatric: He has a normal mood and affect.          Assessment & Plan:   Problem List Items Addressed This Visit      Cardiovascular and Mediastinum   ESSENTIAL HYPERTENSION, BENIGN - Primary    bp in fair control at this time  BP Readings from Last 1 Encounters:  06/26/14 130/62   No changes needed Disc lifstyle change with low sodium diet and exercise    Labs rev  F/u 3 mo         Endocrine   Diabetes mellitus type 2, controlled, without complications    Stable  Lab Results  Component Value Date   HGBA1C 7.1* 06/19/2014   Back on prednisone and had cortisone shot recently for hip  On glipizide-cannot titrate up due to low glucose readings  Rev diet  Adv checking some PP glucose  readings F/u 3 mo with lab prior         Genitourinary   Renal insufficiency    Improved  Disc avoiding nephrotoxic meds  Was down on methotrexate  Continue to watch          Other Visit Diagnoses    Need for vaccination with 13-polyvalent pneumococcal conjugate vaccine        Relevant Orders    Pneumococcal conjugate vaccine 13-valent (Completed)

## 2014-06-26 NOTE — Progress Notes (Signed)
[facility] [address] [place] [address] [phone]  Date:  06/26/2014   Name:  Curtis Frazier   DOB:  Dec 22, 1940   MRN:  [id number]  PCP:  Loura Pardon, MD    Chief Complaint: Annual Exam   History of Present Illness:  Curtis Frazier is a 74 y.o. very pleasant male patient who presents with the following:  DOT NIDDM, HBP, CAD  Patient Active Problem List   Diagnosis Date Noted  . Renal insufficiency 11/14/2013  . Encounter for Medicare annual wellness exam 07/07/2013  . BPH (benign prostatic hyperplasia) 07/05/2012  . Prostate cancer screening 06/27/2012  . Low TSH level 01/22/2012  . PMR (polymyalgia rheumatica) 01/20/2012  . Aortic stenosis 07/08/2011  . New onset a-fib 07/05/2011    Class: Acute  . HYPERKALEMIA 02/18/2010  . ESSENTIAL HYPERTENSION, BENIGN 09/13/2009  . ANEMIA-UNSPECIFIED 06/10/2009  . ANGIODYSPLASIA-INTESTINE 06/10/2009  . MURMUR 05/21/2009  . Left carotid bruit 05/21/2009  . Coronary atherosclerosis 03/09/2009  . GERD 03/09/2009  . ARTHRITIS, RHEUMATOID 04/29/2008  . HX, PERSONAL, COLONIC POLYPS 01/17/2007  . Diabetes mellitus type 2, controlled, without complications 09/62/8366  . Elevated lipids 09/18/2006  . PSORIATIC ARTHRITIS 09/18/2006  . PSORIASIS 09/18/2006  . 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 DEGENERATIVE DISC DISEASE 09/18/2006  . SCOLIOSIS 09/18/2006  . TOBACCO ABUSE, HX OF 09/18/2006    Past Medical History  Diagnosis Date  . Other and unspecified hyperlipidemia   . Coronary atherosclerosis of unspecified type of vessel, native or graft   . Personal history of colonic polyps   . Actinic keratosis   . Psoriatic arthropathy   . Other psoriasis   . Scoliosis (and kyphoscoliosis), idiopathic   . GI bleed 12/10    cecam AVM  . Gastritis 12/10  . Hyperlipidemia     takes Pravastatin daily  . Hypertension     takes Metoprolol daily  . Myocardial infarction 2010  . Shortness of breath     with exertion   . Pneumonia     hx of-2008  . Headache(784.0)     occasionally  . Joint pain   . Joint swelling   . Rheumatoid arthritis(714.0)     takes Methotrexate 7pills weekly  . Degeneration of intervertebral disc, site unspecified   . Chronic back pain     scoliosis/stenosis/radiculopathy,degenerative disc disease  . Psoriasis   . Bruises easily   . Esophageal reflux     takes Omeprazole daily  . History of colonic polyps   . Hemorrhoids   . Kidney stone     hx of  . Blood transfusion 2010  . Type II or unspecified type diabetes mellitus without mention of complication, not stated as uncontrolled     type II;controlled by diet and exercise  . 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 S/P PTCA (percutaneous transluminal coronary angioplasty)     Past Surgical History  Procedure Laterality Date  . Rotator cuff repair Left 2000  . Colonoscopy    . Vasectomy  1979  . Coronary angioplasty with stent placement  02/2009    has one stent  . Carotid doppler  10/12    0-39% R and 60-79% left   . Cataract extraction  2012  . Cardiac catheterization  2010  . Lithotripsy  2009    History  Substance Use Topics  . Smoking status: Former Research scientist (life sciences)  . Smokeless tobacco: Never Used  Comment: quit 2010  . Alcohol Use: 0.0 oz/week    0 Not specified per week     Comment: occasional    Family History  Problem Relation Age of Onset  . Coronary artery disease Mother   . Diabetes Mother   . CAD Mother   . Coronary artery disease Sister   . Hypertension Sister   . Kidney failure Sister   . CAD Sister   . Pancreatic cancer Brother   . Diabetes Brother   . Prostate cancer Brother   . Pancreatic cancer Brother   . Diabetes Brother   . Anesthesia problems Neg Hx   . Hypotension Neg Hx   . Malignant hyperthermia Neg Hx   . Pseudochol deficiency Neg Hx     Allergies  Allergen Reactions  . Ace Inhibitors     Causes high K  .  Angiotensin Receptor Blockers     Causes high K  . Metoprolol     May cause elevated K level  . Penicillins Swelling and Rash  . Tetracycline Swelling and Rash    Medication list has been reviewed and updated.  Current Outpatient Prescriptions on File Prior to Visit  Medication Sig Dispense Refill  . aspirin EC 81 MG tablet Take 81 mg by mouth daily.    . Blood Glucose Monitoring Suppl (ONE TOUCH ULTRA MINI) W/DEVICE KIT 1Pt checks blood sugar twice daily and as directed. Dx E11.9. 1 each 0  . Calcium Carbonate-Vit D-Min (CALCIUM 1200 PO) Take by mouth daily.    . Cholecalciferol 1000 UNITS capsule Take 1,000 Units by mouth daily.      . clopidogrel (PLAVIX) 75 MG tablet TAKE 1 TABLET BY MOUTH EVERY DAY 30 tablet 0  . fish oil-omega-3 fatty acids 1000 MG capsule Take 1 g by mouth daily.     Marland Kitchen FLUZONE HIGH-DOSE 0.5 ML SUSY   0  . FOLIC ACID PO Take 1 tablet by mouth daily.     Marland Kitchen glipiZIDE (GLUCOTROL XL) 5 MG 24 hr tablet TAKE 1 TABLET BY MOUTH EVERY DAY WITH BREAKFAST 30 tablet 0  . glucose blood (ONE TOUCH ULTRA TEST) test strip Check blood sugar twice a day and as directed. E11.9 100 each 5  . guaiFENesin (MUCINEX) 600 MG 12 hr tablet Take 600 mg by mouth as needed.     . InFLIXimab (REMICADE IV) Inject into the vein.    . methotrexate (RHEUMATREX) 2.5 MG tablet Take by mouth once a week. 4 TABS WEEKLY ./CY    . metoprolol succinate (TOPROL-XL) 25 MG 24 hr tablet TAKE 1 TABLET (25 MG TOTAL) BY MOUTH DAILY. 90 tablet 1  . nitroGLYCERIN (NITROSTAT) 0.4 MG SL tablet Place 1 tablet (0.4 mg total) under the tongue every 5 (five) minutes as needed. For chest pain. 25 tablet 12  . omeprazole (PRILOSEC) 40 MG capsule Take 40 mg by mouth daily.      . pravastatin (PRAVACHOL) 20 MG tablet TAKE 1 TABLET BY MOUTH DAILY 30 tablet 11  . predniSONE (DELTASONE) 5 MG tablet Take 5 mg by mouth daily.      No current facility-administered medications on file prior to visit.    Review of  Systems:  As per HPI, otherwise negative.    Physical Examination: Filed Vitals:   06/26/14 1601  BP: 146/72  Pulse: 83  Temp: 98.7 F (37.1 C)  Resp: 16   Filed Vitals:   06/26/14 1601  Height: 5' 8"  (1.727 m)  Weight: 182 lb (  82.555 kg)   Body mass index is 27.68 kg/(m^2). Ideal Body Weight: Weight in (lb) to have BMI = 25: 164.1  GEN: WDWN, NAD, Non-toxic, A & O x 3 HEENT: Atraumatic, Normocephalic. Neck supple. No masses, No LAD. Ears and Nose: No external deformity. CV: RRR, No M/G/R. No JVD. No thrill. No extra heart sounds. PULM: CTA B, no wheezes, crackles, rhonchi. No retractions. No resp. distress. No accessory muscle use. ABD: S, NT, ND, +BS. No rebound. No HSM. EXTR: No c/c/e NEURO Normal gait.  PSYCH: Normally interactive. Conversant. Not depressed or anxious appearing.  Calm demeanor.    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

## 2014-06-26 NOTE — Progress Notes (Signed)
Pre visit review using our clinic review tool, if applicable. No additional management support is needed unless otherwise documented below in the visit note. 

## 2014-06-26 NOTE — Assessment & Plan Note (Signed)
Stable  Lab Results  Component Value Date   HGBA1C 7.1* 06/19/2014   Back on prednisone and had cortisone shot recently for hip  On glipizide-cannot titrate up due to low glucose readings  Rev diet  Adv checking some PP glucose readings F/u 3 mo with lab prior

## 2014-06-26 NOTE — Assessment & Plan Note (Signed)
bp in fair control at this time  BP Readings from Last 1 Encounters:  06/26/14 130/62   No changes needed Disc lifstyle change with low sodium diet and exercise    Labs rev F/u 3 mo

## 2014-06-26 NOTE — Assessment & Plan Note (Signed)
Improved  Disc avoiding nephrotoxic meds  Was down on methotrexate  Continue to watch

## 2014-06-26 NOTE — Patient Instructions (Addendum)
Kidney numbers are improved  Diabetes is stable - no changes  Check some glucose levels 2 hours after meals - just to see what part of the day they go up  Stay as active as you can  prevnar vaccine today   Schedule follow up in 3 months with labs prior   Schedule annual exam for 6 months

## 2014-07-14 ENCOUNTER — Other Ambulatory Visit: Payer: Self-pay | Admitting: Family Medicine

## 2014-07-17 ENCOUNTER — Other Ambulatory Visit: Payer: Self-pay | Admitting: Family Medicine

## 2014-08-12 ENCOUNTER — Other Ambulatory Visit: Payer: Self-pay | Admitting: Family Medicine

## 2014-08-22 ENCOUNTER — Other Ambulatory Visit: Payer: Self-pay | Admitting: Family Medicine

## 2014-08-27 ENCOUNTER — Telehealth: Payer: Self-pay | Admitting: *Deleted

## 2014-08-27 NOTE — Telephone Encounter (Signed)
LM TO  CALL  BACK NEEDS  ECHO ,CAROTID ,AND  APPT  SAME DAY  WITH DR Johnsie Cancel  DUE  April./CY

## 2014-08-31 NOTE — Telephone Encounter (Signed)
PLEASE  REVIEW  APPTS  PT  HAS   F/U WITH  DR Johnsie Cancel  ON  10/23/14  BUT  CAROTIDS  AND  ECHO ARE  ON  SAME DAY  BUT  YEAR  STATES  2022

## 2014-09-02 NOTE — Telephone Encounter (Signed)
Echo and carotid rescheduled to 10/23/2014

## 2014-09-18 ENCOUNTER — Other Ambulatory Visit (INDEPENDENT_AMBULATORY_CARE_PROVIDER_SITE_OTHER): Payer: Medicare Other

## 2014-09-18 DIAGNOSIS — E119 Type 2 diabetes mellitus without complications: Secondary | ICD-10-CM | POA: Diagnosis not present

## 2014-09-18 DIAGNOSIS — N289 Disorder of kidney and ureter, unspecified: Secondary | ICD-10-CM

## 2014-09-18 DIAGNOSIS — I10 Essential (primary) hypertension: Secondary | ICD-10-CM

## 2014-09-18 LAB — RENAL FUNCTION PANEL
Albumin: 3.8 g/dL (ref 3.5–5.2)
BUN: 33 mg/dL — AB (ref 6–23)
CO2: 26 mEq/L (ref 19–32)
Calcium: 9.8 mg/dL (ref 8.4–10.5)
Chloride: 106 mEq/L (ref 96–112)
Creatinine, Ser: 1.38 mg/dL (ref 0.40–1.50)
GFR: 53.37 mL/min — ABNORMAL LOW (ref >60.00)
GLUCOSE: 158 mg/dL — AB (ref 70–99)
PHOSPHORUS: 3.8 mg/dL (ref 2.3–4.6)
POTASSIUM: 5.1 meq/L (ref 3.5–5.1)
Sodium: 138 mEq/L (ref 135–145)

## 2014-09-18 LAB — LIPID PANEL
CHOLESTEROL: 214 mg/dL — AB (ref 0–200)
HDL: 45.7 mg/dL (ref >39.00)
LDL Cholesterol: 147 mg/dL — ABNORMAL HIGH (ref 0–99)
NONHDL: 168.3
Total CHOL/HDL Ratio: 5
Triglycerides: 108 mg/dL (ref 0.0–149.0)
VLDL: 21.6 mg/dL (ref 0.0–40.0)

## 2014-09-18 LAB — HEMOGLOBIN A1C: Hgb A1c MFr Bld: 7.2 % — ABNORMAL HIGH (ref 4.6–6.5)

## 2014-09-25 ENCOUNTER — Telehealth: Payer: Self-pay

## 2014-09-25 ENCOUNTER — Ambulatory Visit (INDEPENDENT_AMBULATORY_CARE_PROVIDER_SITE_OTHER): Payer: Medicare Other | Admitting: Family Medicine

## 2014-09-25 ENCOUNTER — Encounter: Payer: Self-pay | Admitting: Family Medicine

## 2014-09-25 VITALS — BP 104/74 | HR 56 | Temp 98.3°F | Ht 68.0 in | Wt 183.4 lb

## 2014-09-25 DIAGNOSIS — R109 Unspecified abdominal pain: Secondary | ICD-10-CM

## 2014-09-25 DIAGNOSIS — N289 Disorder of kidney and ureter, unspecified: Secondary | ICD-10-CM

## 2014-09-25 DIAGNOSIS — I10 Essential (primary) hypertension: Secondary | ICD-10-CM | POA: Diagnosis not present

## 2014-09-25 DIAGNOSIS — E119 Type 2 diabetes mellitus without complications: Secondary | ICD-10-CM

## 2014-09-25 LAB — POCT URINALYSIS DIPSTICK
BILIRUBIN UA: NEGATIVE
Blood, UA: NEGATIVE
GLUCOSE UA: NEGATIVE
Ketones, UA: NEGATIVE
Leukocytes, UA: NEGATIVE
NITRITE UA: NEGATIVE
SPEC GRAV UA: 1.025
Urobilinogen, UA: NEGATIVE
pH, UA: 6

## 2014-09-25 NOTE — Telephone Encounter (Signed)
Patient informed of lab results. 

## 2014-09-25 NOTE — Patient Instructions (Addendum)
Please leave a urine specimen on the way out  Labs are fairly stable  Keep working on low sugar and low fat diet (Avoid red meat/ fried foods/ egg yolks/ fatty breakfast meats/ butter, cheese and high fat dairy/ and shellfish) Follow up in 3 months with labs prior for annual exam

## 2014-09-25 NOTE — Progress Notes (Signed)
Pre visit review using our clinic review tool, if applicable. No additional management support is needed unless otherwise documented below in the visit note. 

## 2014-09-25 NOTE — Telephone Encounter (Signed)
-----   Message from Abner Greenspan, MD sent at 09/25/2014  1:44 PM EDT ----- Johnnye Lana looks ok  No sign of infection If flank pain does not continue to improve please let me know

## 2014-09-25 NOTE — Progress Notes (Signed)
Subjective:    Patient ID: Curtis Frazier, male    DOB: 1940-07-21, 74 y.o.   MRN: [id number]  HPI Here for f/u of chronic health problems and for flank pain   Flank pain - L side / feels like a knotted muscle on his ribs - is improving  Overall rheumatoid pain is worse  Has rheum appt in May  He feels like the "infusion is not working"-the remicade (last one 2 wk ago) Still on methotrexate  On prednisone - decreased dose  Still on 2 pain pills per day   Wt is up 1 lb with bmi of 27  Renal insuff Fairly stable Lab Results  Component Value Date   CREATININE 1.38 09/18/2014   BUN 33* 09/18/2014   NA 138 09/18/2014   K 5.1 09/18/2014   CL 106 09/18/2014   CO2 26 09/18/2014  he does not always drink enough water  Avoids nsaids   Hyperlipidemia Lab Results  Component Value Date   CHOL 214* 09/18/2014   CHOL 212* 02/20/2014   CHOL 181 11/07/2013   Lab Results  Component Value Date   HDL 45.70 09/18/2014   HDL 35.50* 02/20/2014   HDL 39.80 11/07/2013   Lab Results  Component Value Date   LDLCALC 147* 09/18/2014   LDLCALC 157* 02/20/2014   LDLCALC 124* 11/07/2013   Lab Results  Component Value Date   TRIG 108.0 09/18/2014   TRIG 98.0 02/20/2014   TRIG 86.0 11/07/2013   Lab Results  Component Value Date   CHOLHDL 5 09/18/2014   CHOLHDL 6 02/20/2014   CHOLHDL 5 11/07/2013   Lab Results  Component Value Date   LDLDIRECT 131.3 06/28/2012   LDLDIRECT 149.9 01/19/2012   LDLDIRECT 159.0 04/29/2008   pravastatin and diet  This is mildly improved-thinks he may be eating a bit better  Eats protein bar for bkfast and avoiding fatty foods (gave up french fries)   Diabetes Home sugar results - tend to run 110-120 - in the am , not regularly checking in afternoon , no low readings  DM diet - slacked off a bit with DM diet lately - does generally try to watch it  Exercise -not enough  On 5 mg per day of prednisone  Symptoms-none  A1C last  Lab Results  Component  Value Date   HGBA1C 7.2* 09/18/2014   Up from 7.1  No problems with medications -on glipizide (cannot inc due to low glucose)-no metformin due to renal insuff Renal protection-cannot take ace  Last eye exam 8/15  bp is stable today  No cp or palpitations or headaches or edema  No side effects to medicines  BP Readings from Last 3 Encounters:  09/25/14 104/74  06/26/14 146/72  06/26/14 130/62      Patient Active Problem List   Diagnosis Date Noted  . Renal insufficiency 11/14/2013  . Encounter for Medicare annual wellness exam 07/07/2013  . BPH (benign prostatic hyperplasia) 07/05/2012  . Prostate cancer screening 06/27/2012  . Low TSH level 01/22/2012  . PMR (polymyalgia rheumatica) 01/20/2012  . Aortic stenosis 07/08/2011  . New onset a-fib 07/05/2011    Class: Acute  . HYPERKALEMIA 02/18/2010  . ESSENTIAL HYPERTENSION, BENIGN 09/13/2009  . ANEMIA-UNSPECIFIED 06/10/2009  . ANGIODYSPLASIA-INTESTINE 06/10/2009  . MURMUR 05/21/2009  . Left carotid bruit 05/21/2009  . Coronary atherosclerosis 03/09/2009  . GERD 03/09/2009  . ARTHRITIS, RHEUMATOID 04/29/2008  . HX, PERSONAL, COLONIC POLYPS 01/17/2007  . Diabetes mellitus type 2, controlled, without complications  09/18/2006  . Elevated lipids 09/18/2006  . PSORIATIC ARTHRITIS 09/18/2006  . PSORIASIS 09/18/2006  . 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 DEGENERATIVE DISC DISEASE 09/18/2006  . SCOLIOSIS 09/18/2006  . TOBACCO ABUSE, HX OF 09/18/2006   Past Medical History  Diagnosis Date  . Other and unspecified hyperlipidemia   . Coronary atherosclerosis of unspecified type of vessel, native or graft   . Personal history of colonic polyps   . Actinic keratosis   . Psoriatic arthropathy   . Other psoriasis   . Scoliosis (and kyphoscoliosis), idiopathic   . GI bleed 12/10    cecam AVM  . Gastritis 12/10  . Hyperlipidemia     takes Pravastatin daily  . Hypertension     takes Metoprolol daily  . Myocardial infarction 2010    . Shortness of breath     with exertion  . Pneumonia     hx of-2008  . Headache(784.0)     occasionally  . Joint pain   . Joint swelling   . Rheumatoid arthritis(714.0)     takes Methotrexate 7pills weekly  . Degeneration of intervertebral disc, site unspecified   . Chronic back pain     scoliosis/stenosis/radiculopathy,degenerative disc disease  . Psoriasis   . Bruises easily   . Esophageal reflux     takes Omeprazole daily  . History of colonic polyps   . Hemorrhoids   . Kidney stone     hx of  . Blood transfusion 2010  . Type II or unspecified type diabetes mellitus without mention of complication, not stated as uncontrolled     type II;controlled by diet and exercise  . 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 S/P PTCA (percutaneous transluminal coronary angioplasty)    Past Surgical History  Procedure Laterality Date  . Rotator cuff repair Left 2000  . Colonoscopy    . Vasectomy  1979  . Coronary angioplasty with stent placement  02/2009    has one stent  . Carotid doppler  10/12    0-39% R and 60-79% left   . Cataract extraction  2012  . Cardiac catheterization  2010  . Lithotripsy  2009   History  Substance Use Topics  . Smoking status: Former Research scientist (life sciences)  . Smokeless tobacco: Never Used     Comment: quit 2010  . Alcohol Use: 0.0 oz/week    0 Standard drinks or equivalent per week     Comment: occasional   Family History  Problem Relation Age of Onset  . Coronary artery disease Mother   . Diabetes Mother   . CAD Mother   . Coronary artery disease Sister   . Hypertension Sister   . Kidney failure Sister   . CAD Sister   . Pancreatic cancer Brother   . Diabetes Brother   . Prostate cancer Brother   . Pancreatic cancer Brother   . Diabetes Brother   . Anesthesia problems Neg Hx   . Hypotension Neg Hx   . Malignant hyperthermia Neg Hx   . Pseudochol deficiency Neg Hx    Allergies  Allergen Reactions   . Ace Inhibitors     Causes high K  . Angiotensin Receptor Blockers     Causes high K  . Metoprolol     May cause elevated K level  . Penicillins Swelling and Rash  . Tetracycline Swelling and Rash   Current Outpatient Prescriptions on File Prior to Visit  Medication Sig Dispense  Refill  . aspirin EC 81 MG tablet Take 81 mg by mouth daily.    . Blood Glucose Monitoring Suppl (ONE TOUCH ULTRA MINI) W/DEVICE KIT 1Pt checks blood sugar twice daily and as directed. Dx E11.9. 1 each 0  . Calcium Carbonate-Vit D-Min (CALCIUM 1200 PO) Take by mouth daily.    . Cholecalciferol 1000 UNITS capsule Take 1,000 Units by mouth daily.      . clopidogrel (PLAVIX) 75 MG tablet TAKE 1 TABLET BY MOUTH EVERY DAY 30 tablet 1  . clopidogrel (PLAVIX) 75 MG tablet TAKE 1 TABLET BY MOUTH EVERY DAY 30 tablet 1  . fish oil-omega-3 fatty acids 1000 MG capsule Take 1 g by mouth daily.     Marland Kitchen FLUZONE HIGH-DOSE 0.5 ML SUSY   0  . FOLIC ACID PO Take 1 tablet by mouth daily.     Marland Kitchen glipiZIDE (GLUCOTROL XL) 5 MG 24 hr tablet TAKE 1 TABLET BY MOUTH EVERY DAY WITH BREAKFAST 90 tablet 3  . glucose blood (ONE TOUCH ULTRA TEST) test strip Check blood sugar twice a day and as directed. E11.9 100 each 5  . guaiFENesin (MUCINEX) 600 MG 12 hr tablet Take 600 mg by mouth as needed.     . InFLIXimab (REMICADE IV) Inject into the vein.    . methotrexate (RHEUMATREX) 2.5 MG tablet Take by mouth once a week. 4 TABS WEEKLY ./CY    . metoprolol succinate (TOPROL-XL) 25 MG 24 hr tablet TAKE 1 TABLET (25 MG TOTAL) BY MOUTH DAILY. 90 tablet 1  . nitroGLYCERIN (NITROSTAT) 0.4 MG SL tablet Place 1 tablet (0.4 mg total) under the tongue every 5 (five) minutes as needed. For chest pain. 25 tablet 12  . omeprazole (PRILOSEC) 40 MG capsule Take 40 mg by mouth daily.      . pravastatin (PRAVACHOL) 20 MG tablet TAKE 1 TABLET BY MOUTH DAILY 30 tablet 11  . predniSONE (DELTASONE) 5 MG tablet Take 5 mg by mouth daily.      No current  facility-administered medications on file prior to visit.       Review of Systems Review of Systems  Constitutional: Negative for fever, appetite change, fatigue and unexpected weight change.  Eyes: Negative for pain and visual disturbance.  Respiratory: Negative for cough and shortness of breath.   Cardiovascular: Negative for cp or palpitations    Gastrointestinal: Negative for nausea, diarrhea and constipation.pos for L flank pain   Genitourinary: Negative for urgency and frequency.  Skin: Negative for pallor or rash   MSK pos for joint and muscle pain with rheum problems/PMR Neurological: Negative for weakness, light-headedness, numbness and headaches.  Hematological: Negative for adenopathy. Does not bruise/bleed easily.  Psychiatric/Behavioral: Negative for dysphoric mood. The patient is not nervous/anxious.         Objective:   Physical Exam  Constitutional: He appears well-developed and well-nourished. No distress.  HENT:  Head: Normocephalic and atraumatic.  Mouth/Throat: Oropharynx is clear and moist.  Eyes: Conjunctivae and EOM are normal. Pupils are equal, round, and reactive to light.  Neck: Normal range of motion. Neck supple. No JVD present. Carotid bruit is not present. No thyromegaly present.  Cardiovascular: Normal rate, regular rhythm and intact distal pulses.  Exam reveals no gallop.   Murmur heard. Pulmonary/Chest: Effort normal and breath sounds normal. No respiratory distress. He has no wheezes. He has no rales.  No crackles  Abdominal: Soft. Bowel sounds are normal. He exhibits no distension, no abdominal bruit and no mass. There  is tenderness. There is no rebound and no guarding.  Tender in L flank area - lateral under ribs  No M felt  No rebound or guarding No CVA tenderness No suprapubic tenderness or fullness    Musculoskeletal: He exhibits tenderness. He exhibits no edema.  Tender joints diffusely  Lymphadenopathy:    He has no cervical  adenopathy.  Neurological: He is alert. He has normal reflexes.  Skin: Skin is warm and dry. No rash noted.  Psychiatric: He has a normal mood and affect.          Assessment & Plan:   Problem List Items Addressed This Visit      Cardiovascular and Mediastinum   ESSENTIAL HYPERTENSION, BENIGN - Primary    bp in fair control at this time  BP Readings from Last 1 Encounters:  09/25/14 104/74   No changes needed Disc lifstyle change with low sodium diet and exercise  Labs reviewed  Watching renal insuff        Endocrine   Diabetes mellitus type 2, controlled, without complications    Lab Results  Component Value Date   HGBA1C 7.2* 09/18/2014   This is stable  On glipizide (no metformin due to renal insuff)  Cannot inc dose due to hypoglycemia Rev diet/exercise  Still on low dose prednisone for PMR as well          Genitourinary   Renal insufficiency    Fairly stable Disc imp of water intake  ua today in light of flank pain         Other   Left flank pain    Suspect muscular based on exam (and it is improving) UA today  Adv warm compress  Will watch for any GI or resp or urol symptoms        Relevant Orders   POCT urinalysis dipstick (Completed)

## 2014-09-27 NOTE — Assessment & Plan Note (Signed)
Suspect muscular based on exam (and it is improving) UA today  Adv warm compress  Will watch for any GI or resp or urol symptoms

## 2014-09-27 NOTE — Assessment & Plan Note (Signed)
bp in fair control at this time  BP Readings from Last 1 Encounters:  09/25/14 104/74   No changes needed Disc lifstyle change with low sodium diet and exercise  Labs reviewed  Watching renal insuff

## 2014-09-27 NOTE — Assessment & Plan Note (Signed)
Lab Results  Component Value Date   HGBA1C 7.2* 09/18/2014   This is stable  On glipizide (no metformin due to renal insuff)  Cannot inc dose due to hypoglycemia Rev diet/exercise  Still on low dose prednisone for PMR as well

## 2014-09-27 NOTE — Assessment & Plan Note (Signed)
Fairly stable Disc imp of water intake  ua today in light of flank pain

## 2014-10-14 ENCOUNTER — Ambulatory Visit (INDEPENDENT_AMBULATORY_CARE_PROVIDER_SITE_OTHER): Payer: Medicare Other | Admitting: Family Medicine

## 2014-10-14 ENCOUNTER — Encounter: Payer: Self-pay | Admitting: Family Medicine

## 2014-10-14 ENCOUNTER — Ambulatory Visit (INDEPENDENT_AMBULATORY_CARE_PROVIDER_SITE_OTHER)
Admission: RE | Admit: 2014-10-14 | Discharge: 2014-10-14 | Disposition: A | Discharge status: Home / Self Care | Payer: Medicare Other | Source: Ambulatory Visit | Attending: Family Medicine | Admitting: Family Medicine

## 2014-10-14 VITALS — BP 100/50 | HR 66 | Temp 98.5°F | Ht 68.0 in | Wt 183.5 lb

## 2014-10-14 DIAGNOSIS — R05 Cough: Secondary | ICD-10-CM

## 2014-10-14 DIAGNOSIS — R058 Other specified cough: Secondary | ICD-10-CM

## 2014-10-14 MED ORDER — LEVOFLOXACIN 250 MG PO TABS: 250.0000 mg | ORAL_TABLET | Freq: Every day | ORAL | Status: DC

## 2014-10-14 NOTE — Patient Instructions (Signed)
Chest xray today - we will contact you with a result  Drink fluids Take levaquin as directed  Rest  You can take mucinex for congestion in head and chest  Update if not starting to improve in a week or if worsening

## 2014-10-14 NOTE — Progress Notes (Signed)
Pre visit review using our clinic review tool, if applicable. No additional management support is needed unless otherwise documented below in the visit note. 

## 2014-10-14 NOTE — Progress Notes (Signed)
Subjective:    Patient ID: Curtis Frazier, male    DOB: [birth year], 74 y.o.   MRN: [id number]  HPI Here for chest congestion  Has had some discomfort under shoulder blades in back  Felt bad yesterday Then chills and sweats - did not take temp   Coughing up a little bit of phlegm - yellow  Not wheezing- but a little sob  Pulse ox 93% today on room air after ambulation   Has had a runny/stuffy nose  No ST   Took a tylenol this am   Patient Active Problem List   Diagnosis Date Noted  . Productive cough 10/14/2014  . Left flank pain 09/25/2014  . Renal insufficiency 11/14/2013  . Encounter for Medicare annual wellness exam 07/07/2013  . BPH (benign prostatic hyperplasia) 07/05/2012  . Prostate cancer screening 06/27/2012  . Low TSH level 01/22/2012  . PMR (polymyalgia rheumatica) 01/20/2012  . Aortic stenosis 07/08/2011  . New onset a-fib 07/05/2011    Class: Acute  . HYPERKALEMIA 02/18/2010  . ESSENTIAL HYPERTENSION, BENIGN 09/13/2009  . ANEMIA-UNSPECIFIED 06/10/2009  . ANGIODYSPLASIA-INTESTINE 06/10/2009  . MURMUR 05/21/2009  . Left carotid bruit 05/21/2009  . Coronary atherosclerosis 03/09/2009  . GERD 03/09/2009  . ARTHRITIS, RHEUMATOID 04/29/2008  . HX, PERSONAL, COLONIC POLYPS 01/17/2007  . Diabetes mellitus type 2, controlled, without complications 56/86/1683  . Elevated lipids 09/18/2006  . PSORIATIC ARTHRITIS 09/18/2006  . PSORIASIS 09/18/2006  . 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 DEGENERATIVE DISC DISEASE 09/18/2006  . SCOLIOSIS 09/18/2006  . TOBACCO ABUSE, HX OF 09/18/2006   Past Medical History  Diagnosis Date  . Other and unspecified hyperlipidemia   . Coronary atherosclerosis of unspecified type of vessel, native or graft   . Personal history of colonic polyps   . Actinic keratosis   . Psoriatic arthropathy   . Other psoriasis   . Scoliosis (and kyphoscoliosis), idiopathic   . GI bleed 12/10    cecam AVM  . Gastritis 12/10  . Hyperlipidemia    takes Pravastatin daily  . Hypertension     takes Metoprolol daily  . Myocardial infarction 2010  . Shortness of breath     with exertion  . Pneumonia     hx of-2008  . Headache(784.0)     occasionally  . Joint pain   . Joint swelling   . Rheumatoid arthritis(714.0)     takes Methotrexate 7pills weekly  . Degeneration of intervertebral disc, site unspecified   . Chronic back pain     scoliosis/stenosis/radiculopathy,degenerative disc disease  . Psoriasis   . Bruises easily   . Esophageal reflux     takes Omeprazole daily  . History of colonic polyps   . Hemorrhoids   . Kidney stone     hx of  . Blood transfusion 2010  . Type II or unspecified type diabetes mellitus without mention of complication, not stated as uncontrolled     type II;controlled by diet and exercise  . 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 S/P PTCA (percutaneous transluminal coronary angioplasty)    Past Surgical History  Procedure Laterality Date  . Rotator cuff repair Left 2000  . Colonoscopy    . Vasectomy  1979  . Coronary angioplasty with stent placement  02/2009    has one stent  . Carotid doppler  10/12    0-39% R and 60-79% left   . Cataract extraction  2012  . Cardiac catheterization  2010  . Lithotripsy  2009   History  Substance Use Topics  . Smoking status: Former Research scientist (life sciences)  . Smokeless tobacco: Never Used     Comment: quit 2010  . Alcohol Use: 0.0 oz/week    0 Standard drinks or equivalent per week     Comment: occasional   Family History  Problem Relation Age of Onset  . Coronary artery disease Mother   . Diabetes Mother   . CAD Mother   . Coronary artery disease Sister   . Hypertension Sister   . Kidney failure Sister   . CAD Sister   . Pancreatic cancer Brother   . Diabetes Brother   . Prostate cancer Brother   . Pancreatic cancer Brother   . Diabetes Brother   . Anesthesia problems Neg Hx   . Hypotension Neg Hx   .  Malignant hyperthermia Neg Hx   . Pseudochol deficiency Neg Hx    Allergies  Allergen Reactions  . Ace Inhibitors     Causes high K  . Angiotensin Receptor Blockers     Causes high K  . Metoprolol     May cause elevated K level  . Penicillins Swelling and Rash  . Tetracycline Swelling and Rash   Current Outpatient Prescriptions on File Prior to Visit  Medication Sig Dispense Refill  . aspirin EC 81 MG tablet Take 81 mg by mouth daily.    . Blood Glucose Monitoring Suppl (ONE TOUCH ULTRA MINI) W/DEVICE KIT 1Pt checks blood sugar twice daily and as directed. Dx E11.9. 1 each 0  . Calcium Carbonate-Vit D-Min (CALCIUM 1200 PO) Take by mouth daily.    . Cholecalciferol 1000 UNITS capsule Take 1,000 Units by mouth daily.      . clopidogrel (PLAVIX) 75 MG tablet TAKE 1 TABLET BY MOUTH EVERY DAY 30 tablet 1  . clopidogrel (PLAVIX) 75 MG tablet TAKE 1 TABLET BY MOUTH EVERY DAY 30 tablet 1  . fish oil-omega-3 fatty acids 1000 MG capsule Take 1 g by mouth daily.     Marland Kitchen FLUZONE HIGH-DOSE 0.5 ML SUSY   0  . FOLIC ACID PO Take 1 tablet by mouth daily.     Marland Kitchen glipiZIDE (GLUCOTROL XL) 5 MG 24 hr tablet TAKE 1 TABLET BY MOUTH EVERY DAY WITH BREAKFAST 90 tablet 3  . glucose blood (ONE TOUCH ULTRA TEST) test strip Check blood sugar twice a day and as directed. E11.9 100 each 5  . guaiFENesin (MUCINEX) 600 MG 12 hr tablet Take 600 mg by mouth as needed.     . InFLIXimab (REMICADE IV) Inject into the vein.    . methotrexate (RHEUMATREX) 2.5 MG tablet Take by mouth once a week. 4 TABS WEEKLY ./CY    . metoprolol succinate (TOPROL-XL) 25 MG 24 hr tablet TAKE 1 TABLET (25 MG TOTAL) BY MOUTH DAILY. 90 tablet 1  . nitroGLYCERIN (NITROSTAT) 0.4 MG SL tablet Place 1 tablet (0.4 mg total) under the tongue every 5 (five) minutes as needed. For chest pain. 25 tablet 12  . omeprazole (PRILOSEC) 40 MG capsule Take 40 mg by mouth daily.      . pravastatin (PRAVACHOL) 20 MG tablet TAKE 1 TABLET BY MOUTH DAILY 30 tablet  11  . predniSONE (DELTASONE) 5 MG tablet Take 5 mg by mouth daily.      No current facility-administered medications on file prior to visit.     Review of Systems Review of Systems  Constitutional: Negative for fever, appetite change,  and  unexpected weight change.  ENT pos for post nasal drip/ neg for sinus pain  Eyes: Negative for pain and visual disturbance.  Respiratory: pos for cough and wheeze  Cardiovascular: Negative for cp or palpitations    Gastrointestinal: Negative for nausea, diarrhea and constipation.  Genitourinary: Negative for urgency and frequency.  Skin: Negative for pallor or rash   Neurological: Negative for weakness, light-headedness, numbness and headaches.  Hematological: Negative for adenopathy. Does not bruise/bleed easily.  Psychiatric/Behavioral: Negative for dysphoric mood. The patient is not nervous/anxious.         Objective:   Physical Exam  Constitutional: He appears well-developed and well-nourished. No distress.  HENT:  Head: Normocephalic and atraumatic.  Right Ear: External ear normal.  Left Ear: External ear normal.  Mouth/Throat: Oropharynx is clear and moist.  Nares are injected and congested  No sinus tenderness Clear rhinorrhea and post nasal drip   Eyes: Conjunctivae and EOM are normal. Pupils are equal, round, and reactive to light. Right eye exhibits no discharge. Left eye exhibits no discharge.  Neck: Normal range of motion. Neck supple.  Cardiovascular: Normal rate and normal heart sounds.   Pulmonary/Chest: Effort normal. No respiratory distress. He has wheezes. He has no rales. He exhibits no tenderness.  Rhonchi diffusely  No rales  Scant wheeze on forced exp only  Good air exch   Musculoskeletal: He exhibits no edema.  Lymphadenopathy:    He has no cervical adenopathy.  Neurological: He is alert.  Skin: Skin is warm and dry. No rash noted.  Psychiatric: He has a normal mood and affect.          Assessment & Plan:    Problem List Items Addressed This Visit      Other   Productive cough - Primary    Chest congestion/ uri symptoms in former smoker also on immunologic agents from rheumatologist  High risk for pneumonia  levaquin px (renal dose)- pt cannot take pxn or tetracyclines  Disc symptomatic care - see instructions on AVS  Update if not starting to improve in a week or if worsening   cxr now-pend result       Relevant Orders   DG Chest 2 View (Completed)

## 2014-10-14 NOTE — Assessment & Plan Note (Signed)
Chest congestion/ uri symptoms in former smoker also on immunologic agents from rheumatologist  High risk for pneumonia  levaquin px (renal dose)- pt cannot take pxn or tetracyclines  Disc symptomatic care - see instructions on AVS  Update if not starting to improve in a week or if worsening   cxr now-pend result

## 2014-10-22 NOTE — Progress Notes (Signed)
Patient ID: Curtis Frazier, male   DOB: 1940-11-18, 74 y.o.   MRN: [id number] Kemp is seen today For F/U of CAD, carotid disease. He has a BMS in his circ from [place] in 02/2009. GI bleed 2014 from AVM.  1/13 had lumbar surgery with PAF in setting of post op anemia and pain. Converted with amiodarone and continues on only beta blocker now On prednisone taper for PMR This has improved his hip and knee pain. No TIA symptoms. Continue antiplatelet Rx   Low risk myovue 12/14  Overall Impression: This is a low risk scan. There is excellent wall motion. I cannot rule out the possibility of slight ischemia at the apical cap, but this is always a borderline call. There are no large areas of scar or ischemia.  LV Ejection Fraction: 71%. LV Wall Motion: Normal Wall Motion.   12/06/01   LICA 50-09%  Needs f/u duplex 6/16   Allergic to ARB/ACE  On remicade for psoriatic arthritis Trying to taper prednisone and methotrexate   Has AS with heavily calcified valve Reviewed echo 4/15 and still mild AS despite morphology with mean gradient going from only 14 to 18 mmHg and peak 32 mmHg Needs refill on nitro  No chest pain   Reviewed echo from today 10/22/14  And AS more moderate EF normal    ROS: Denies fever, malais, weight loss, blurry vision, decreased visual acuity, cough, sputum, SOB, hemoptysis, pleuritic pain, palpitaitons, heartburn, abdominal pain, melena, lower extremity edema, claudication, or rash.  All other systems reviewed and negative  General: Affect appropriate Healthy:  appears stated age HEENT: normal Neck supple with no adenopathy JVP normal bilateral l bruits no thyromegaly Lungs clear with no wheezing and good diaphragmatic motion Heart:  S1/S2 AS  murmur, no rub, gallop or click PMI normal Abdomen: benighn, BS positve, no tenderness, no AAA no bruit.  No HSM or HJR Distal pulses intact with no bruits No edema Neuro non-focal Skin warm and dry No muscular weakness   Current  Outpatient Prescriptions  Medication Sig Dispense Refill  . aspirin EC 81 MG tablet Take 81 mg by mouth daily.    . Blood Glucose Monitoring Suppl (ONE TOUCH ULTRA MINI) W/DEVICE KIT 1Pt checks blood sugar twice daily and as directed. Dx E11.9. 1 each 0  . Calcium Carbonate-Vit D-Min (CALCIUM 1200 PO) Take by mouth daily.    . Cholecalciferol 1000 UNITS capsule Take 1,000 Units by mouth daily.      . clopidogrel (PLAVIX) 75 MG tablet TAKE 1 TABLET BY MOUTH EVERY DAY 30 tablet 1  . fish oil-omega-3 fatty acids 1000 MG capsule Take 1 g by mouth daily.     Marland Kitchen FLUZONE HIGH-DOSE 0.5 ML SUSY   0  . FOLIC ACID PO Take 1 tablet by mouth daily.     Marland Kitchen glipiZIDE (GLUCOTROL XL) 5 MG 24 hr tablet TAKE 1 TABLET BY MOUTH EVERY DAY WITH BREAKFAST 90 tablet 3  . glucose blood (ONE TOUCH ULTRA TEST) test strip Check blood sugar twice a day and as directed. E11.9 100 each 5  . guaiFENesin (MUCINEX) 600 MG 12 hr tablet Take 600 mg by mouth as needed.     . InFLIXimab (REMICADE IV) Inject into the vein.    Marland Kitchen levofloxacin (LEVAQUIN) 250 MG tablet Take 1 tablet (250 mg total) by mouth daily. 10 tablet 0  . methotrexate (RHEUMATREX) 2.5 MG tablet Take by mouth once a week. 4 TABS WEEKLY ./CY    . metoprolol  succinate (TOPROL-XL) 25 MG 24 hr tablet TAKE 1 TABLET (25 MG TOTAL) BY MOUTH DAILY. 90 tablet 1  . nitroGLYCERIN (NITROSTAT) 0.4 MG SL tablet Place 1 tablet (0.4 mg total) under the tongue every 5 (five) minutes as needed. For chest pain. 25 tablet 12  . omeprazole (PRILOSEC) 40 MG capsule Take 40 mg by mouth daily.      . pravastatin (PRAVACHOL) 20 MG tablet TAKE 1 TABLET BY MOUTH DAILY 30 tablet 11  . predniSONE (DELTASONE) 5 MG tablet Take 5 mg by mouth daily.      No current facility-administered medications for this visit.    Allergies  Ace inhibitors; Angiotensin receptor blockers; Metoprolol; Penicillins; and Tetracycline  Electrocardiogram: 10/01/12  SR rate 62 LAE otherwise normal  05/01/14   SB rate  52 normal no change from 2014   Assessment and Plan AS:  Moderate by echo reviewed today heavily calcified asymptomatic  F/u echo in a year Carotid:  73-22% LICA stenosis.  F/U duplex in one year Arthritis:  F/u Rheum trying to get off prednisone and methotrexate but hips lock up GERD:  Continue prilosec CAD:  Distant BMS to LAD 2010 [facility]  Stable with no angina and good activity level.  Continue medical Rx Has nitro DM:  Discussed low carb diet.  Target hemoglobin A1c is 6.5 or less.  Continue current medications.   Jenkins Rouge

## 2014-10-23 ENCOUNTER — Ambulatory Visit (HOSPITAL_BASED_OUTPATIENT_CLINIC_OR_DEPARTMENT_OTHER): Payer: Medicare Other

## 2014-10-23 ENCOUNTER — Other Ambulatory Visit: Payer: Self-pay

## 2014-10-23 ENCOUNTER — Ambulatory Visit (HOSPITAL_COMMUNITY): Payer: Medicare Other | Attending: Internal Medicine

## 2014-10-23 ENCOUNTER — Encounter (HOSPITAL_COMMUNITY): Payer: Medicare Other

## 2014-10-23 ENCOUNTER — Encounter: Payer: Self-pay | Admitting: Cardiovascular Disease

## 2014-10-23 ENCOUNTER — Ambulatory Visit (INDEPENDENT_AMBULATORY_CARE_PROVIDER_SITE_OTHER): Payer: Medicare Other | Admitting: Cardiovascular Disease

## 2014-10-23 VITALS — BP 156/82 | HR 60 | Ht 68.0 in | Wt 182.8 lb

## 2014-10-23 DIAGNOSIS — I10 Essential (primary) hypertension: Secondary | ICD-10-CM | POA: Insufficient documentation

## 2014-10-23 DIAGNOSIS — I340 Nonrheumatic mitral (valve) insufficiency: Secondary | ICD-10-CM | POA: Diagnosis not present

## 2014-10-23 DIAGNOSIS — Z87891 Personal history of nicotine dependence: Secondary | ICD-10-CM | POA: Diagnosis not present

## 2014-10-23 DIAGNOSIS — R0989 Other specified symptoms and signs involving the circulatory and respiratory systems: Secondary | ICD-10-CM | POA: Diagnosis present

## 2014-10-23 DIAGNOSIS — I6523 Occlusion and stenosis of bilateral carotid arteries: Secondary | ICD-10-CM | POA: Insufficient documentation

## 2014-10-23 DIAGNOSIS — E119 Type 2 diabetes mellitus without complications: Secondary | ICD-10-CM | POA: Diagnosis not present

## 2014-10-23 DIAGNOSIS — E785 Hyperlipidemia, unspecified: Secondary | ICD-10-CM | POA: Insufficient documentation

## 2014-10-23 DIAGNOSIS — I739 Peripheral vascular disease, unspecified: Secondary | ICD-10-CM | POA: Diagnosis not present

## 2014-10-23 DIAGNOSIS — I350 Nonrheumatic aortic (valve) stenosis: Secondary | ICD-10-CM

## 2014-10-23 DIAGNOSIS — R010 Benign and innocent cardiac murmurs: Secondary | ICD-10-CM

## 2014-10-23 DIAGNOSIS — I2510 Atherosclerotic heart disease of native coronary artery without angina pectoris: Secondary | ICD-10-CM | POA: Insufficient documentation

## 2014-10-23 DIAGNOSIS — R011 Cardiac murmur, unspecified: Secondary | ICD-10-CM

## 2014-10-23 NOTE — Patient Instructions (Signed)
Medication Instructions:  NO CHANGES   Labwork: NONE  Testing/Procedures: Your physician has requested that you have an echocardiogram. Echocardiography is a painless test that uses sound waves to create images of your heart. It provides your doctor with information about the size and shape of your heart and how well your heart's chambers and valves are working. This procedure takes approximately one hour. There are no restrictions for this procedure. IN A YEAR  Your physician has requested that you have a carotid duplex. This test is an ultrasound of the carotid arteries in your neck. It looks at blood flow through these arteries that supply the brain with blood. Allow one hour for this exam. There are no restrictions or special instructions. IN  A YEAR   Follow-Up: Your physician wants you to follow-up in: [place] will receive a reminder letter in the mail two months in advance. If you don't receive a letter, please call our office to schedule the follow-up appointment.  Any Other Special Instructions Will Be Listed Below (If Applicable).

## 2014-11-18 ENCOUNTER — Other Ambulatory Visit: Payer: Self-pay | Admitting: Family Medicine

## 2014-11-19 ENCOUNTER — Telehealth: Payer: Self-pay | Admitting: Family Medicine

## 2014-11-19 DIAGNOSIS — E785 Hyperlipidemia, unspecified: Secondary | ICD-10-CM

## 2014-11-19 DIAGNOSIS — I10 Essential (primary) hypertension: Secondary | ICD-10-CM

## 2014-11-19 DIAGNOSIS — E119 Type 2 diabetes mellitus without complications: Secondary | ICD-10-CM

## 2014-11-19 DIAGNOSIS — N289 Disorder of kidney and ureter, unspecified: Secondary | ICD-10-CM

## 2014-11-19 DIAGNOSIS — N400 Enlarged prostate without lower urinary tract symptoms: Secondary | ICD-10-CM

## 2014-11-19 NOTE — Telephone Encounter (Signed)
-----   Message from Ellamae Sia sent at 11/11/2014  6:06 PM EDT ----- Regarding: Lab orders for Friday, 6.10.16 Patient is scheduled for CPX labs, please order future labs, Thanks , Karna Christmas

## 2014-11-20 ENCOUNTER — Other Ambulatory Visit (INDEPENDENT_AMBULATORY_CARE_PROVIDER_SITE_OTHER): Payer: Medicare Other

## 2014-11-20 DIAGNOSIS — E119 Type 2 diabetes mellitus without complications: Secondary | ICD-10-CM

## 2014-11-20 DIAGNOSIS — N289 Disorder of kidney and ureter, unspecified: Secondary | ICD-10-CM

## 2014-11-20 DIAGNOSIS — E785 Hyperlipidemia, unspecified: Secondary | ICD-10-CM

## 2014-11-20 DIAGNOSIS — I10 Essential (primary) hypertension: Secondary | ICD-10-CM | POA: Diagnosis not present

## 2014-11-20 DIAGNOSIS — N400 Enlarged prostate without lower urinary tract symptoms: Secondary | ICD-10-CM | POA: Diagnosis not present

## 2014-11-20 LAB — CBC WITH DIFFERENTIAL/PLATELET
BASOS ABS: 0 10*3/uL (ref 0.0–0.1)
Basophils Relative: 0.4 % (ref 0.0–3.0)
EOS ABS: 0 10*3/uL (ref 0.0–0.7)
Eosinophils Relative: 0.6 % (ref 0.0–5.0)
HCT: 38.6 % — ABNORMAL LOW (ref 39.0–52.0)
Hemoglobin: 12.5 g/dL — ABNORMAL LOW (ref 13.0–17.0)
Lymphocytes Relative: 50.9 % — ABNORMAL HIGH (ref 12.0–46.0)
Lymphs Abs: 4.1 10*3/uL — ABNORMAL HIGH (ref 0.7–4.0)
MCHC: 32.3 g/dL (ref 30.0–36.0)
MCV: 97.4 fl (ref 78.0–100.0)
MONO ABS: 0.7 10*3/uL (ref 0.1–1.0)
Monocytes Relative: 8.8 % (ref 3.0–12.0)
Neutro Abs: 3.2 10*3/uL (ref 1.4–7.7)
Neutrophils Relative %: 39.3 % — ABNORMAL LOW (ref 43.0–77.0)
PLATELETS: 261 10*3/uL (ref 150.0–400.0)
RBC: 3.96 Mil/uL — ABNORMAL LOW (ref 4.22–5.81)
RDW: 18 % — ABNORMAL HIGH (ref 11.5–15.5)
WBC: 8.1 10*3/uL (ref 4.0–10.5)

## 2014-11-20 LAB — LIPID PANEL
Cholesterol: 215 mg/dL — ABNORMAL HIGH (ref 0–200)
HDL: 40.4 mg/dL (ref >39.00)
LDL Cholesterol: 141 mg/dL — ABNORMAL HIGH (ref 0–99)
NONHDL: 174.6
Total CHOL/HDL Ratio: 5
Triglycerides: 166 mg/dL — ABNORMAL HIGH (ref 0.0–149.0)
VLDL: 33.2 mg/dL (ref 0.0–40.0)

## 2014-11-20 LAB — COMPREHENSIVE METABOLIC PANEL
ALBUMIN: 4 g/dL (ref 3.5–5.2)
ALT: 12 U/L (ref 0–53)
AST: 14 U/L (ref 0–37)
Alkaline Phosphatase: 65 U/L (ref 39–117)
BILIRUBIN TOTAL: 0.9 mg/dL (ref 0.2–1.2)
BUN: 29 mg/dL — AB (ref 6–23)
CALCIUM: 9.7 mg/dL (ref 8.4–10.5)
CHLORIDE: 107 meq/L (ref 96–112)
CO2: 29 meq/L (ref 19–32)
CREATININE: 1.44 mg/dL (ref 0.40–1.50)
GFR: 50.78 mL/min — ABNORMAL LOW (ref >60.00)
GLUCOSE: 116 mg/dL — AB (ref 70–99)
POTASSIUM: 4.6 meq/L (ref 3.5–5.1)
Sodium: 141 mEq/L (ref 135–145)
TOTAL PROTEIN: 6.6 g/dL (ref 6.0–8.3)

## 2014-11-20 LAB — HEMOGLOBIN A1C: Hgb A1c MFr Bld: 6.6 % — ABNORMAL HIGH (ref 4.6–6.5)

## 2014-11-20 LAB — PSA: PSA: 0.46 ng/mL (ref 0.10–4.00)

## 2014-11-20 LAB — TSH: TSH: 0.6 u[IU]/mL (ref 0.35–4.50)

## 2014-11-23 ENCOUNTER — Encounter: Payer: Self-pay | Admitting: Family Medicine

## 2014-11-23 ENCOUNTER — Ambulatory Visit (INDEPENDENT_AMBULATORY_CARE_PROVIDER_SITE_OTHER): Payer: Medicare Other | Admitting: Family Medicine

## 2014-11-23 VITALS — BP 122/68 | HR 58 | Temp 98.6°F | Ht 68.0 in | Wt 176.5 lb

## 2014-11-23 DIAGNOSIS — E785 Hyperlipidemia, unspecified: Secondary | ICD-10-CM

## 2014-11-23 DIAGNOSIS — I10 Essential (primary) hypertension: Secondary | ICD-10-CM

## 2014-11-23 DIAGNOSIS — Z0000 Encounter for general adult medical examination without abnormal findings: Secondary | ICD-10-CM | POA: Diagnosis not present

## 2014-11-23 DIAGNOSIS — N400 Enlarged prostate without lower urinary tract symptoms: Secondary | ICD-10-CM

## 2014-11-23 DIAGNOSIS — N289 Disorder of kidney and ureter, unspecified: Secondary | ICD-10-CM

## 2014-11-23 DIAGNOSIS — E119 Type 2 diabetes mellitus without complications: Secondary | ICD-10-CM

## 2014-11-23 NOTE — Assessment & Plan Note (Signed)
Disc goals for lipids and reasons to control them Rev labs with pt Rev low sat fat diet in detail Pravastatin is at 20 mg -the highest dose he can tolerate - not at goal

## 2014-11-23 NOTE — Progress Notes (Signed)
Pre visit review using our clinic review tool, if applicable. No additional management support is needed unless otherwise documented below in the visit note. 

## 2014-11-23 NOTE — Assessment & Plan Note (Signed)
Reviewed health habits including diet and exercise and skin cancer prevention Reviewed appropriate screening tests for age  Also reviewed health mt list, fam hx and immunization status , as well as social and family history   See HPI  Labs reviewed  Please work on an advance directive (living will and power of attorney)-see the materials I gave you  Keep up with annual eye exams Get your treatment for osteoporosis Diabetes is improved - please keep working on healthy diet  Stay physically and mentally active

## 2014-11-23 NOTE — Assessment & Plan Note (Addendum)
Reviewed health habits including diet and exercise and skin cancer prevention Reviewed appropriate screening tests for age  Also reviewed health mt list, fam hx and immunization status , as well as social and family history   See HPI  Labs reviewed  Please work on an advance directive (living will and power of attorney)-see the materials I gave you  Keep up with annual eye exams Get your treatment for osteoporosis Diabetes is improved - please keep working on healthy diet  Stay physically and mentally active

## 2014-11-23 NOTE — Progress Notes (Signed)
Subjective:    Patient ID: Curtis Frazier, male    DOB: 11-Apr-1941, 74 y.o.   MRN: [id number]  HPI Here for annual medicare wellness visit as well as chronic/acute medical problems and annual preventative exam  I have personally reviewed the Medicare Annual Wellness questionnaire and have noted 1. The patient's medical and social history 2. Their use of alcohol, tobacco or illicit drugs 3. Their current medications and supplements 4. The patient's functional ability including ADL's, fall risks, home safety risks and hearing or visual             impairment. 5. Diet and physical activities 6. Evidence for depression or mood disorders  The patients weight, height, BMI have been recorded in the chart and visual acuity is per eye clinic.  I have made referrals, counseling and provided education to the patient based review of the above and I have provided the pt with a written personalized care plan for preventive services. Reviewed and updated provider list, see scanned forms.  Feels ok overall   See scanned forms.  Routine anticipatory guidance given to patient.  See health maintenance. Colon cancer screening 3/15 - recall per pt is 5 y  Flu vaccine 10/15 Tetanus vaccine 12/13  Pneumovax complete - as of 1/16  Zoster vaccine - cannot get due to immune mod medications  Prostate cancer screening -symptoms of BPH have not worsened  Nocturia once  Lab Results  Component Value Date   PSA 0.46 11/20/2014   PSA 0.74 09/26/2012   PSA 0.63 04/19/2007    Advance directive - does not have written up  Cognitive function addressed- see scanned forms- and if abnormal then additional documentation follows. Memory is not as good as it used to be - forgets things briefly  Not confused or getting lost    PMH and SH reviewed  Meds, vitals, and allergies reviewed.   ROS: See HPI.  Otherwise negative.    bp is stable today  No cp or palpitations or headaches or edema  No side effects to  medicines  BP Readings from Last 3 Encounters:  11/23/14 122/68  10/23/14 156/82  10/14/14 100/50     Hx of renal insuff   Chemistry      Component Value Date/Time   NA 141 11/20/2014 0859   K 4.6 11/20/2014 0859   CL 107 11/20/2014 0859   CO2 29 11/20/2014 0859   BUN 29* 11/20/2014 0859   CREATININE 1.44 11/20/2014 0859      Component Value Date/Time   CALCIUM 9.7 11/20/2014 0859   ALKPHOS 65 11/20/2014 0859   AST 14 11/20/2014 0859   ALT 12 11/20/2014 0859   BILITOT 0.9 11/20/2014 0859     making an effort to drink more water now in the warmer weather (outdoors a lot)   DM 2  Lab Results  Component Value Date   HGBA1C 6.6* 11/20/2014   This is improved from 7.2 From eating better, and staying active   bp is stable today  No cp or palpitations or headaches or edema  No side effects to medicines  BP Readings from Last 3 Encounters:  11/23/14 122/68  10/23/14 156/82  10/14/14 100/50     Hx of BPH Not a lot of complaints  Nocturia only once per night  Lab Results  Component Value Date   PSA 0.46 11/20/2014   PSA 0.74 09/26/2012   PSA 0.63 04/19/2007     Cholesterol On pravastatin and diet  Lab  Results  Component Value Date   CHOL 215* 11/20/2014   CHOL 214* 09/18/2014   CHOL 212* 02/20/2014   Lab Results  Component Value Date   HDL 40.40 11/20/2014   HDL 45.70 09/18/2014   HDL 35.50* 02/20/2014   Lab Results  Component Value Date   LDLCALC 141* 11/20/2014   LDLCALC 147* 09/18/2014   LDLCALC 157* 02/20/2014   Lab Results  Component Value Date   TRIG 166.0* 11/20/2014   TRIG 108.0 09/18/2014   TRIG 98.0 02/20/2014   Lab Results  Component Value Date   CHOLHDL 5 11/20/2014   CHOLHDL 5 09/18/2014   CHOLHDL 6 02/20/2014   Lab Results  Component Value Date   LDLDIRECT 131.3 06/28/2012   LDLDIRECT 149.9 01/19/2012   LDLDIRECT 159.0 04/29/2008   this is stable  Only tolerates 20 mg of pravastatin  Not at goal however  Wt is down 6 lb  with bmi of 26 Better  He is trying to eat less and loose some weight - goal is 170   Patient Active Problem List   Diagnosis Date Noted  . Routine general medical examination at a health care facility 11/23/2014  . Productive cough 10/14/2014  . Left flank pain 09/25/2014  . Renal insufficiency 11/14/2013  . Encounter for Medicare annual wellness exam 07/07/2013  . BPH (benign prostatic hyperplasia) 07/05/2012  . Prostate cancer screening 06/27/2012  . Low TSH level 01/22/2012  . PMR (polymyalgia rheumatica) 01/20/2012  . Aortic stenosis 07/08/2011  . New onset a-fib 07/05/2011    Class: Acute  . HYPERKALEMIA 02/18/2010  . ESSENTIAL HYPERTENSION, BENIGN 09/13/2009  . ANEMIA-UNSPECIFIED 06/10/2009  . ANGIODYSPLASIA-INTESTINE 06/10/2009  . MURMUR 05/21/2009  . Left carotid bruit 05/21/2009  . Coronary atherosclerosis 03/09/2009  . GERD 03/09/2009  . ARTHRITIS, RHEUMATOID 04/29/2008  . HX, PERSONAL, COLONIC POLYPS 01/17/2007  . Diabetes mellitus type 2, controlled, without complications 79/07/4095  . Elevated lipids 09/18/2006  . PSORIATIC ARTHRITIS 09/18/2006  . PSORIASIS 09/18/2006  . 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 DEGENERATIVE DISC DISEASE 09/18/2006  . SCOLIOSIS 09/18/2006  . TOBACCO ABUSE, HX OF 09/18/2006   Past Medical History  Diagnosis Date  . Other and unspecified hyperlipidemia   . Coronary atherosclerosis of unspecified type of vessel, native or graft   . Personal history of colonic polyps   . Actinic keratosis   . Psoriatic arthropathy   . Other psoriasis   . Scoliosis (and kyphoscoliosis), idiopathic   . GI bleed 12/10    cecam AVM  . Gastritis 12/10  . Hyperlipidemia     takes Pravastatin daily  . Hypertension     takes Metoprolol daily  . Myocardial infarction 2010  . Shortness of breath     with exertion  . Pneumonia     hx of-2008  . Headache(784.0)     occasionally  . Joint pain   . Joint swelling   . Rheumatoid arthritis(714.0)      takes Methotrexate 7pills weekly  . Degeneration of intervertebral disc, site unspecified   . Chronic back pain     scoliosis/stenosis/radiculopathy,degenerative disc disease  . Psoriasis   . Bruises easily   . Esophageal reflux     takes Omeprazole daily  . History of colonic polyps   . Hemorrhoids   . Kidney stone     hx of  . Blood transfusion 2010  . Type II or unspecified type diabetes mellitus without mention of complication, not stated as uncontrolled     type  II;controlled by diet and exercise  . 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 S/P PTCA (percutaneous transluminal coronary angioplasty)    Past Surgical History  Procedure Laterality Date  . Rotator cuff repair Left 2000  . Colonoscopy    . Vasectomy  1979  . Coronary angioplasty with stent placement  02/2009    has one stent  . Carotid doppler  10/12    0-39% R and 60-79% left   . Cataract extraction  2012  . Cardiac catheterization  2010  . Lithotripsy  2009   History  Substance Use Topics  . Smoking status: Former Research scientist (life sciences)  . Smokeless tobacco: Never Used     Comment: quit 2010  . Alcohol Use: 0.0 oz/week    0 Standard drinks or equivalent per week     Comment: occasional   Family History  Problem Relation Age of Onset  . Coronary artery disease Mother   . Diabetes Mother   . CAD Mother   . Coronary artery disease Sister   . Hypertension Sister   . Kidney failure Sister   . CAD Sister   . Diabetes Brother   . Prostate cancer Brother   . Pancreatic cancer Brother   . Diabetes Brother   . Anesthesia problems Neg Hx   . Hypotension Neg Hx   . Malignant hyperthermia Neg Hx   . Pseudochol deficiency Neg Hx    Allergies  Allergen Reactions  . Ace Inhibitors     Causes high K  . Angiotensin Receptor Blockers     Causes high K  . Metoprolol     May cause elevated K level  . Penicillins Swelling and Rash  . Tetracycline Swelling and Rash    Current Outpatient Prescriptions on File Prior to Visit  Medication Sig Dispense Refill  . aspirin EC 81 MG tablet Take 81 mg by mouth daily.    . Blood Glucose Monitoring Suppl (ONE TOUCH ULTRA MINI) W/DEVICE KIT 1Pt checks blood sugar twice daily and as directed. Dx E11.9. 1 each 0  . Calcium Carbonate-Vit D-Min (CALCIUM 1200 PO) Take by mouth daily.    . Cholecalciferol 1000 UNITS capsule Take 1,000 Units by mouth daily.      . clopidogrel (PLAVIX) 75 MG tablet TAKE 1 TABLET BY MOUTH EVERY DAY 30 tablet 0  . fish oil-omega-3 fatty acids 1000 MG capsule Take 1 g by mouth daily.     Marland Kitchen FOLIC ACID PO Take 1 tablet by mouth daily.     Marland Kitchen glipiZIDE (GLUCOTROL XL) 5 MG 24 hr tablet TAKE 1 TABLET BY MOUTH EVERY DAY WITH BREAKFAST 90 tablet 3  . glucose blood (ONE TOUCH ULTRA TEST) test strip Check blood sugar twice a day and as directed. E11.9 100 each 5  . guaiFENesin (MUCINEX) 600 MG 12 hr tablet Take 600 mg by mouth as needed.     . InFLIXimab (REMICADE IV) Inject into the vein.    . methotrexate (RHEUMATREX) 2.5 MG tablet Take by mouth once a week. 4 TABS WEEKLY ./CY    . metoprolol succinate (TOPROL-XL) 25 MG 24 hr tablet TAKE 1 TABLET (25 MG TOTAL) BY MOUTH DAILY. 90 tablet 1  . nitroGLYCERIN (NITROSTAT) 0.4 MG SL tablet Place 1 tablet (0.4 mg total) under the tongue every 5 (five) minutes as needed. For chest pain. 25 tablet 12  . omeprazole (PRILOSEC) 40 MG capsule Take 40 mg by mouth daily.      Marland Kitchen  pravastatin (PRAVACHOL) 20 MG tablet TAKE 1 TABLET BY MOUTH DAILY 30 tablet 11  . predniSONE (DELTASONE) 5 MG tablet Take 5 mg by mouth daily.      No current facility-administered medications on file prior to visit.     Review of Systems Review of Systems  Constitutional: Negative for fever, appetite change, fatigue and unexpected weight change.  Eyes: Negative for pain and visual disturbance.  Respiratory: Negative for cough and shortness of breath.   Cardiovascular: Negative for cp or  palpitations    Gastrointestinal: Negative for nausea, diarrhea and constipation.  Genitourinary: Negative for urgency and frequency.  Skin: Negative for pallor or rash   MSK pos for joint pain that is improved  Neurological: Negative for weakness, light-headedness, numbness and headaches.  Hematological: Negative for adenopathy. Does not bruise/bleed easily.  Psychiatric/Behavioral: Negative for dysphoric mood. The patient is not nervous/anxious.         Objective:   Physical Exam  Constitutional: He appears well-developed and well-nourished. No distress.  HENT:  Head: Normocephalic and atraumatic.  Right Ear: External ear normal.  Left Ear: External ear normal.  Nose: Nose normal.  Mouth/Throat: Oropharynx is clear and moist.  Eyes: Conjunctivae and EOM are normal. Pupils are equal, round, and reactive to light. Right eye exhibits no discharge. Left eye exhibits no discharge. No scleral icterus.  Neck: Normal range of motion. Neck supple. No JVD present. Carotid bruit is not present. No thyromegaly present.  Cardiovascular: Normal rate, regular rhythm and intact distal pulses.  Exam reveals no gallop.   Murmur heard. Baseline systolic M heard on L  Pulmonary/Chest: Effort normal and breath sounds normal. No respiratory distress. He has no wheezes. He exhibits no tenderness.  Diffusely distant bs   Abdominal: Soft. Bowel sounds are normal. He exhibits no distension, no abdominal bruit and no mass. There is no tenderness.  Musculoskeletal: He exhibits no edema or tenderness.  Lymphadenopathy:    He has no cervical adenopathy.  Neurological: He is alert. He has normal reflexes. No cranial nerve deficit. He exhibits normal muscle tone. Coordination normal.  Skin: Skin is warm and dry. No rash noted. No erythema. No pallor.  Diff lentigo and solar aging   Psychiatric: He has a normal mood and affect.          Assessment & Plan:   Problem List Items Addressed This Visit    BPH  (benign prostatic hyperplasia)    Stable symptoms Lab Results  Component Value Date   PSA 0.46 11/20/2014   PSA 0.74 09/26/2012   PSA 0.63 04/19/2007         Diabetes mellitus type 2, controlled, without complications    Lab Results  Component Value Date   HGBA1C 6.6* 11/20/2014   Improved Still on low dose prednisone  Better diet and activity      Elevated lipids    Disc goals for lipids and reasons to control them Rev labs with pt Rev low sat fat diet in detail Pravastatin is at 20 mg -the highest dose he can tolerate - not at goal       Encounter for Medicare annual wellness exam    Reviewed health habits including diet and exercise and skin cancer prevention Reviewed appropriate screening tests for age  Also reviewed health mt list, fam hx and immunization status , as well as social and family history   See HPI  Labs reviewed  Please work on an advance directive (living will and power of  attorney)-see the materials I gave you  Keep up with annual eye exams Get your treatment for osteoporosis Diabetes is improved - please keep working on healthy diet  Stay physically and mentally active       ESSENTIAL HYPERTENSION, BENIGN - Primary    bp in fair control at this time  BP Readings from Last 1 Encounters:  11/23/14 122/68   No changes needed Disc lifstyle change with low sodium diet and exercise  Labs reviewed       Renal insufficiency    Stable  Lab Results  Component Value Date   CREATININE 1.44 11/20/2014   Fluid intake urged  Continue to follow       Routine general medical examination at a health care facility    Reviewed health habits including diet and exercise and skin cancer prevention Reviewed appropriate screening tests for age  Also reviewed health mt list, fam hx and immunization status , as well as social and family history   See HPI  Labs reviewed  Please work on an advance directive (living will and power of attorney)-see the  materials I gave you  Keep up with annual eye exams Get your treatment for osteoporosis Diabetes is improved - please keep working on healthy diet  Stay physically and mentally active

## 2014-11-23 NOTE — Assessment & Plan Note (Signed)
Stable  Lab Results  Component Value Date   CREATININE 1.44 11/20/2014   Fluid intake urged  Continue to follow

## 2014-11-23 NOTE — Assessment & Plan Note (Signed)
Stable symptoms Lab Results  Component Value Date   PSA 0.46 11/20/2014   PSA 0.74 09/26/2012   PSA 0.63 04/19/2007

## 2014-11-23 NOTE — Assessment & Plan Note (Signed)
bp in fair control at this time  BP Readings from Last 1 Encounters:  11/23/14 122/68   No changes needed Disc lifstyle change with low sodium diet and exercise  Labs reviewed

## 2014-11-23 NOTE — Patient Instructions (Addendum)
Please work on an advance directive (living will and power of attorney)-see the materials I gave you  Keep up with annual eye exams Get your treatment for osteoporosis Diabetes is improved - please keep working on healthy diet  Stay physically and mentally active   Follow up in 6 months with labs prior

## 2014-11-23 NOTE — Assessment & Plan Note (Signed)
Lab Results  Component Value Date   HGBA1C 6.6* 11/20/2014   Improved Still on low dose prednisone  Better diet and activity

## 2014-11-26 ENCOUNTER — Encounter: Payer: Self-pay | Admitting: Internal Medicine

## 2014-12-16 ENCOUNTER — Other Ambulatory Visit: Payer: Self-pay | Admitting: Family Medicine

## 2014-12-18 ENCOUNTER — Other Ambulatory Visit: Payer: Medicare Other

## 2014-12-25 ENCOUNTER — Encounter: Payer: Medicare Other | Admitting: Family Medicine

## 2015-01-11 LAB — HM DIABETES EYE EXAM

## 2015-01-15 ENCOUNTER — Encounter: Payer: Self-pay | Admitting: Family Medicine

## 2015-01-15 ENCOUNTER — Ambulatory Visit (INDEPENDENT_AMBULATORY_CARE_PROVIDER_SITE_OTHER)
Admission: RE | Admit: 2015-01-15 | Discharge: 2015-01-15 | Disposition: A | Discharge status: Home / Self Care | Payer: Medicare Other | Source: Ambulatory Visit | Attending: Family Medicine | Admitting: Family Medicine

## 2015-01-15 ENCOUNTER — Ambulatory Visit (INDEPENDENT_AMBULATORY_CARE_PROVIDER_SITE_OTHER): Payer: Medicare Other | Admitting: Family Medicine

## 2015-01-15 VITALS — BP 136/70 | HR 52 | Temp 98.7°F | Ht 68.0 in | Wt 182.0 lb

## 2015-01-15 DIAGNOSIS — R0789 Other chest pain: Secondary | ICD-10-CM

## 2015-01-15 DIAGNOSIS — M546 Pain in thoracic spine: Secondary | ICD-10-CM | POA: Diagnosis not present

## 2015-01-15 NOTE — Patient Instructions (Signed)
Chest xray today  I think you may have a rhomboid muscle spasm  Use heat and massage in the area of pain  Also check in with Dr Adline Mango office about your recent back xray  Update me if not improving  Do the stretch I taught you

## 2015-01-15 NOTE — Progress Notes (Signed)
Subjective:    Patient ID: Curtis Frazier, male    DOB: 1941/05/05, 74 y.o.   MRN: [id number]  HPI Here for a place on his back that bothers him  Right side There is a knot around shoulder blade  Hurts when he gets up - then calms down but does not go away  Saw Dr Arnoldo Morale - had an xray and has not heard back   Is does help to stretch   Also hurts to cough or sneeze or take a deep breath   Patient Active Problem List   Diagnosis Date Noted  . Back pain, thoracic 01/15/2015  . Chest wall pain 01/15/2015  . Routine general medical examination at a health care facility 11/23/2014  . Productive cough 10/14/2014  . Left flank pain 09/25/2014  . Renal insufficiency 11/14/2013  . Encounter for Medicare annual wellness exam 07/07/2013  . BPH (benign prostatic hyperplasia) 07/05/2012  . Prostate cancer screening 06/27/2012  . Low TSH level 01/22/2012  . PMR (polymyalgia rheumatica) 01/20/2012  . Aortic stenosis 07/08/2011  . New onset a-fib 07/05/2011    Class: Acute  . HYPERKALEMIA 02/18/2010  . ESSENTIAL HYPERTENSION, BENIGN 09/13/2009  . ANEMIA-UNSPECIFIED 06/10/2009  . ANGIODYSPLASIA-INTESTINE 06/10/2009  . MURMUR 05/21/2009  . Left carotid bruit 05/21/2009  . Coronary atherosclerosis 03/09/2009  . GERD 03/09/2009  . ARTHRITIS, RHEUMATOID 04/29/2008  . HX, PERSONAL, COLONIC POLYPS 01/17/2007  . Diabetes mellitus type 2, controlled, without complications 89/16/9450  . Elevated lipids 09/18/2006  . PSORIATIC ARTHRITIS 09/18/2006  . PSORIASIS 09/18/2006  . 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 DEGENERATIVE DISC DISEASE 09/18/2006  . SCOLIOSIS 09/18/2006  . TOBACCO ABUSE, HX OF 09/18/2006   Past Medical History  Diagnosis Date  . Other and unspecified hyperlipidemia   . Coronary atherosclerosis of unspecified type of vessel, native or graft   . Personal history of colonic polyps   . Actinic keratosis   . Psoriatic arthropathy   . Other psoriasis   . Scoliosis (and  kyphoscoliosis), idiopathic   . GI bleed 12/10    cecam AVM  . Gastritis 12/10  . Hyperlipidemia     takes Pravastatin daily  . Hypertension     takes Metoprolol daily  . Myocardial infarction 2010  . Shortness of breath     with exertion  . Pneumonia     hx of-2008  . Headache(784.0)     occasionally  . Joint pain   . Joint swelling   . Rheumatoid arthritis(714.0)     takes Methotrexate 7pills weekly  . Degeneration of intervertebral disc, site unspecified   . Chronic back pain     scoliosis/stenosis/radiculopathy,degenerative disc disease  . Psoriasis   . Bruises easily   . Esophageal reflux     takes Omeprazole daily  . History of colonic polyps   . Hemorrhoids   . Kidney stone     hx of  . Blood transfusion 2010  . Type II or unspecified type diabetes mellitus without mention of complication, not stated as uncontrolled     type II;controlled by diet and exercise  . 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 S/P PTCA (percutaneous transluminal coronary angioplasty)    Past Surgical History  Procedure Laterality Date  . Rotator cuff repair Left 2000  . Colonoscopy    . Vasectomy  1979  . Coronary angioplasty with stent placement  02/2009    has one stent  . Carotid  doppler  10/12    0-39% R and 60-79% left   . Cataract extraction  2012  . Cardiac catheterization  2010  . Lithotripsy  2009   History  Substance Use Topics  . Smoking status: Former Research scientist (life sciences)  . Smokeless tobacco: Never Used     Comment: quit 2010  . Alcohol Use: 0.0 oz/week    0 Standard drinks or equivalent per week     Comment: occasional   Family History  Problem Relation Age of Onset  . Coronary artery disease Mother   . Diabetes Mother   . CAD Mother   . Coronary artery disease Sister   . Hypertension Sister   . Kidney failure Sister   . CAD Sister   . Diabetes Brother   . Prostate cancer Brother   . Pancreatic cancer Brother   .  Diabetes Brother   . Anesthesia problems Neg Hx   . Hypotension Neg Hx   . Malignant hyperthermia Neg Hx   . Pseudochol deficiency Neg Hx    Allergies  Allergen Reactions  . Ace Inhibitors     Causes high K  . Angiotensin Receptor Blockers     Causes high K  . Metoprolol     May cause elevated K level  . Penicillins Swelling and Rash  . Tetracycline Swelling and Rash   Current Outpatient Prescriptions on File Prior to Visit  Medication Sig Dispense Refill  . aspirin EC 81 MG tablet Take 81 mg by mouth daily.    . Blood Glucose Monitoring Suppl (ONE TOUCH ULTRA MINI) W/DEVICE KIT 1Pt checks blood sugar twice daily and as directed. Dx E11.9. 1 each 0  . Calcium Carbonate-Vit D-Min (CALCIUM 1200 PO) Take by mouth daily.    . Cholecalciferol 1000 UNITS capsule Take 1,000 Units by mouth daily.      . clopidogrel (PLAVIX) 75 MG tablet TAKE 1 TABLET BY MOUTH EVERY DAY 30 tablet 5  . fish oil-omega-3 fatty acids 1000 MG capsule Take 1 g by mouth daily.     Marland Kitchen FOLIC ACID PO Take 1 tablet by mouth daily.     Marland Kitchen glipiZIDE (GLUCOTROL XL) 5 MG 24 hr tablet TAKE 1 TABLET BY MOUTH EVERY DAY WITH BREAKFAST 90 tablet 3  . glucose blood (ONE TOUCH ULTRA TEST) test strip Check blood sugar twice a day and as directed. E11.9 100 each 5  . InFLIXimab (REMICADE IV) Inject into the vein.    . methotrexate (RHEUMATREX) 2.5 MG tablet Take by mouth once a week. 4 TABS WEEKLY ./CY    . metoprolol succinate (TOPROL-XL) 25 MG 24 hr tablet TAKE 1 TABLET (25 MG TOTAL) BY MOUTH DAILY. 90 tablet 1  . nitroGLYCERIN (NITROSTAT) 0.4 MG SL tablet Place 1 tablet (0.4 mg total) under the tongue every 5 (five) minutes as needed. For chest pain. 25 tablet 12  . omeprazole (PRILOSEC) 40 MG capsule Take 40 mg by mouth daily.      . pravastatin (PRAVACHOL) 20 MG tablet TAKE 1 TABLET BY MOUTH DAILY 30 tablet 11  . predniSONE (DELTASONE) 5 MG tablet Take 5 mg by mouth daily.      No current facility-administered medications on  file prior to visit.    Review of Systems Review of Systems  Constitutional: Negative for fever, appetite change, fatigue and unexpected weight change.  Eyes: Negative for pain and visual disturbance.  Respiratory: Negative for cough and shortness of breath.   Cardiovascular: Negative for cp or palpitations  Gastrointestinal: Negative for nausea, diarrhea and constipation.  Genitourinary: Negative for urgency and frequency.  Skin: Negative for pallor or rash   MSK pos for thoracic back and chest wall pain  Neurological: Negative for weakness, light-headedness, numbness and headaches.  Hematological: Negative for adenopathy. Does not bruise/bleed easily.  Psychiatric/Behavioral: Negative for dysphoric mood. The patient is not nervous/anxious.         Objective:   Physical Exam  Constitutional: He appears well-developed and well-nourished. No distress.  Well appearing elderly male   HENT:  Head: Normocephalic and atraumatic.  Eyes: Conjunctivae and EOM are normal. Pupils are equal, round, and reactive to light.  Neck: Normal range of motion. Neck supple.  Cardiovascular: Normal rate and regular rhythm.   Murmur heard. Pulmonary/Chest: Effort normal and breath sounds normal. No respiratory distress. He has no wheezes. He has no rales. He exhibits tenderness.  Diffusely distant bs Air exch is good however No wheeze or rales  Tender in L lat and post lower ribs without crepitus or skin change   Musculoskeletal: He exhibits tenderness. He exhibits no edema.  Tender in mid to lower TS (mild) More tender in R thoracic musculature -namely rhomboid area Rhomboid stretch targets the area of pain   Some tenderness in R lat/post ribs also   Some fullness of tissue in that area -appears to be scar tissue from prev surg vs smal area of lipoma   Lymphadenopathy:    He has no cervical adenopathy.  Neurological: He is alert. He has normal reflexes.  Skin: Skin is warm and dry. No rash  noted. No erythema.  Area of fullness to the R of TS feels like small lipoma or scar tissue from prev surgery  Psychiatric: He has a normal mood and affect.          Assessment & Plan:   Problem List Items Addressed This Visit    Back pain, thoracic    Primarily on the R side mid thoracic- pt feels a lump that resembles scar tissue from prev surg or lipoma (that is not tender) Has tenderness in musculature and ribs and rhomboid area (detected with rhomboid stretch) Mildly on spine  Had xr recently of spine with Dr Nolberto Hanlon check on that  cxr today since pain is also in CW and worse to take deep breath in former smoker  Recommend heat to the area       Chest wall pain - Primary    cxr today Former smoker       Relevant Orders   DG Chest 2 View (Completed)

## 2015-01-15 NOTE — Progress Notes (Signed)
Pre visit review using our clinic review tool, if applicable. No additional management support is needed unless otherwise documented below in the visit note. 

## 2015-01-16 NOTE — Assessment & Plan Note (Signed)
Primarily on the R side mid thoracic- pt feels a lump that resembles scar tissue from prev surg or lipoma (that is not tender) Has tenderness in musculature and ribs and rhomboid area (detected with rhomboid stretch) Mildly on spine  Had xr recently of spine with Dr Nolberto Hanlon check on that  cxr today since pain is also in CW and worse to take deep breath in former smoker  Recommend heat to the area

## 2015-01-16 NOTE — Assessment & Plan Note (Signed)
cxr today Former smoker

## 2015-01-20 ENCOUNTER — Telehealth: Payer: Self-pay | Admitting: Family Medicine

## 2015-01-20 NOTE — Telephone Encounter (Signed)
Addressed through result notes  

## 2015-01-20 NOTE — Telephone Encounter (Signed)
Patient returned Shapale's call.  Please call patient back on his cell phone.

## 2015-01-21 ENCOUNTER — Telehealth: Payer: Self-pay | Admitting: Family Medicine

## 2015-01-21 MED ORDER — CALCITONIN (SALMON) 200 UNIT/ACT NA SOLN: 1.0000 | Freq: Every day | NASAL | Status: DC

## 2015-01-21 NOTE — Telephone Encounter (Signed)
-----   Message from Tammi Sou, [place] sent at 01/20/2015  2:55 PM EDT ----- Spoke with pt and advised him of his xray results and Dr. Marliss Coots comments. Pt will check with Dr. Arnoldo Morale Pt said his ortho doc is Dr. Marijean Bravo and he has an appt scheduled with Dr. Barbera Setters assistant on 01/29/15 Pt said he had a DEXA done about a month ago Pt also said he is willing to try the miacalcin and uses [place]

## 2015-01-21 NOTE — Telephone Encounter (Signed)
I sent the miacalcin F/u with Dr Marijean Bravo as planned (I think he is rheumatology?)-please send a copy of my note Please request his dexa so I can review it, thanks

## 2015-01-22 NOTE — Telephone Encounter (Signed)
Pt picked up Rx Copy of OV note/xray sent to Dr. Amil Amen Pt had DEXA done at Dr. Melissa Noon office so also requested a copy of it

## 2015-03-05 ENCOUNTER — Ambulatory Visit (INDEPENDENT_AMBULATORY_CARE_PROVIDER_SITE_OTHER): Payer: Medicare Other | Admitting: Family Medicine

## 2015-03-05 ENCOUNTER — Encounter: Payer: Self-pay | Admitting: Family Medicine

## 2015-03-05 VITALS — BP 116/62 | HR 82 | Temp 98.5°F | Ht 68.0 in | Wt 183.5 lb

## 2015-03-05 DIAGNOSIS — J209 Acute bronchitis, unspecified: Secondary | ICD-10-CM | POA: Diagnosis not present

## 2015-03-05 MED ORDER — CLARITHROMYCIN 250 MG PO TABS: 250.0000 mg | ORAL_TABLET | Freq: Two times a day (BID) | ORAL | Status: DC

## 2015-03-05 NOTE — Assessment & Plan Note (Signed)
>   2 week history in pt with longterm smoking history.  Also possible COPD , not yet diagnosed.  No wheeze today.  Treat with antibiotics x 10 days.

## 2015-03-05 NOTE — Progress Notes (Signed)
Pre visit review using our clinic review tool, if applicable. No additional management support is needed unless otherwise documented below in the visit note. 

## 2015-03-05 NOTE — Progress Notes (Addendum)
   Subjective:    Patient ID: Curtis Frazier, male    DOB: 1940-11-21, 74 y.o.   MRN: [id number]  Cough This is a new problem. The current episode started 1 to 4 weeks ago ( 2weeks). The problem has been gradually worsening. The problem occurs every few hours. The cough is productive of sputum and productive of purulent sputum. Associated symptoms include nasal congestion and shortness of breath. Pertinent negatives include no chills, ear pain, fever, sore throat or wheezing. Associated symptoms comments: Sinus pain and pressure, mainly on left maxillary sinus. The symptoms are aggravated by lying down (trouble sleeping with cough). Risk factors for lung disease include smoking/tobacco exposure (50 pack year history, quit smoking). Treatments tried: mucinex , tylenol. The treatment provided mild relief. There is no history of asthma, bronchiectasis, bronchitis, COPD, environmental allergies or pneumonia.  CAD    Probable COPD on CXR in 01/2015.  Review of Systems  Constitutional: Negative for fever and chills.  HENT: Negative for ear pain and sore throat.   Respiratory: Positive for cough and shortness of breath. Negative for wheezing.   Allergic/Immunologic: Negative for environmental allergies.       Objective:   Physical Exam  Constitutional: Vital signs are normal. He appears well-developed and well-nourished.  Non-toxic appearance. He does not appear ill. No distress.  HENT:  Head: Normocephalic and atraumatic.  Right Ear: Hearing, tympanic membrane, external ear and ear canal normal. No tenderness. No foreign bodies. Tympanic membrane is not retracted and not bulging.  Left Ear: Hearing, tympanic membrane, external ear and ear canal normal. No tenderness. No foreign bodies. Tympanic membrane is not retracted and not bulging.  Nose: Nose normal. No mucosal edema or rhinorrhea. Right sinus exhibits no maxillary sinus tenderness and no frontal sinus tenderness. Left sinus exhibits no maxillary  sinus tenderness and no frontal sinus tenderness.  Mouth/Throat: Uvula is midline, oropharynx is clear and moist and mucous membranes are normal. Normal dentition. No dental caries. No oropharyngeal exudate or tonsillar abscesses.  Eyes: Conjunctivae, EOM and lids are normal. Pupils are equal, round, and reactive to light. Lids are everted and swept, no foreign bodies found.  Neck: Trachea normal, normal range of motion and phonation normal. Neck supple. Carotid bruit is not present. No thyroid mass and no thyromegaly present.  Cardiovascular: Normal rate, regular rhythm, S1 normal, S2 normal, normal heart sounds, intact distal pulses and normal pulses.  Exam reveals no gallop.   No murmur heard. Pulmonary/Chest: Effort normal and breath sounds normal. No respiratory distress. He has no wheezes. He has no rhonchi. He has no rales.  Abdominal: Soft. Normal appearance and bowel sounds are normal. There is no hepatosplenomegaly. There is no tenderness. There is no rebound, no guarding and no CVA tenderness. No hernia.  Neurological: He is alert. He has normal reflexes.  Skin: Skin is warm, dry and intact. No rash noted.  Psychiatric: He has a normal mood and affect. His speech is normal and behavior is normal. Judgment normal.          Assessment & Plan:

## 2015-03-05 NOTE — Patient Instructions (Signed)
Complete antibiotics course for 10 days. Add nasal saline spray 20-3 times a day. Mucinex DM twice daily for cough. Call if fever on antibiotics, or if not improving as expected. Go to ER severe shortness of breath.

## 2015-04-07 ENCOUNTER — Other Ambulatory Visit: Payer: Self-pay | Admitting: Family Medicine

## 2015-04-27 ENCOUNTER — Other Ambulatory Visit: Payer: Self-pay | Admitting: Family Medicine

## 2015-05-03 ENCOUNTER — Telehealth: Payer: Self-pay | Admitting: Cardiovascular Disease

## 2015-05-03 DIAGNOSIS — R079 Chest pain, unspecified: Secondary | ICD-10-CM

## 2015-05-03 NOTE — Telephone Encounter (Signed)
SPOKE WITH PT'S  WIFE  RE  MESSAGE  NOT   SURE  OF  DETAILS  UNABLE TO SPEAK TO  PT    AS PT IS AT  WORK   DRIVING   TRUCK   WIFE  BELIEVES    C/O CHEST  TIGHTNESS SINCE  DAY BEFORE  YESTERDAY  AS  WELL AS  SOB   HAS NOT  TAKEN ANY NTG.PT  HAS  HAD   STENT   5  YEARS  AGO BUT  WIFE  DOES NOT THINK  PAIN IS  SAME  AS  BACK THEN  WILL FORWARD TO  DR  Johnsie Cancel  FOR REVIEW  .CY

## 2015-05-03 NOTE — Telephone Encounter (Signed)
PT'S WIFE AWARE   WILL SCHEDULE  MYOVIEW

## 2015-05-03 NOTE — Telephone Encounter (Signed)
MYOVIEW  SCHEDULED FOR   05-12-15 AT  12:30  PT NEEDS  FLEX PA  APPT  AS WELL PER  DR Johnsie Cancel .Adonis Housekeeper

## 2015-05-03 NOTE — Telephone Encounter (Signed)
New problem    Pt's wife stated pt is having tightness in his chest and not chest pain.Need call back from nurse.

## 2015-05-03 NOTE — Telephone Encounter (Signed)
Have him get exercise myovue ASAP and f/u with flex PA

## 2015-05-10 ENCOUNTER — Telehealth (HOSPITAL_COMMUNITY): Payer: Self-pay | Admitting: *Deleted

## 2015-05-10 NOTE — Telephone Encounter (Signed)
Patient given detailed instructions per Myocardial Perfusion Study Information Sheet for the test on 05/12/15 at 1230. Patient notified to arrive 15 minutes early and that it is imperative to arrive on time for appointment to keep from having the test rescheduled.  If you need to cancel or reschedule your appointment, please call the office within 24 hours of your appointment. Failure to do so may result in a cancellation of your appointment, and a $50 no show fee. Patient verbalized understanding.Enza Shone, Ranae Palms

## 2015-05-12 ENCOUNTER — Ambulatory Visit (HOSPITAL_COMMUNITY): Payer: Medicare Other | Attending: Cardiovascular Disease

## 2015-05-12 ENCOUNTER — Encounter: Payer: Self-pay | Admitting: Cardiovascular Disease

## 2015-05-12 DIAGNOSIS — I10 Essential (primary) hypertension: Secondary | ICD-10-CM | POA: Diagnosis not present

## 2015-05-12 DIAGNOSIS — I779 Disorder of arteries and arterioles, unspecified: Secondary | ICD-10-CM | POA: Diagnosis not present

## 2015-05-12 DIAGNOSIS — R0609 Other forms of dyspnea: Secondary | ICD-10-CM | POA: Diagnosis not present

## 2015-05-12 DIAGNOSIS — E119 Type 2 diabetes mellitus without complications: Secondary | ICD-10-CM | POA: Diagnosis not present

## 2015-05-12 DIAGNOSIS — R079 Chest pain, unspecified: Secondary | ICD-10-CM | POA: Diagnosis not present

## 2015-05-12 LAB — MYOCARDIAL PERFUSION IMAGING
CHL CUP NUCLEAR SRS: 1
CHL CUP RESTING HR STRESS: 70 {beats}/min
LV dias vol: 102 mL
LV sys vol: 35 mL
NUC STRESS TID: 0.94
Peak HR: 97 {beats}/min
RATE: 0.31
SDS: 10
SSS: 11

## 2015-05-12 MED ORDER — REGADENOSON 0.4 MG/5ML IV SOLN
0.4000 mg | Freq: Once | INTRAVENOUS | Status: AC
  Administered 2015-05-12: 0.4 mg via INTRAVENOUS

## 2015-05-12 MED ORDER — TECHNETIUM TC 99M SESTAMIBI GENERIC - CARDIOLITE
10.8000 | Freq: Once | INTRAVENOUS | Status: AC | PRN
  Administered 2015-05-12: 11 via INTRAVENOUS

## 2015-05-12 MED ORDER — TECHNETIUM TC 99M SESTAMIBI GENERIC - CARDIOLITE
30.7000 | Freq: Once | INTRAVENOUS | Status: AC | PRN
  Administered 2015-05-12: 30.7 via INTRAVENOUS

## 2015-05-13 ENCOUNTER — Telehealth: Payer: Self-pay | Admitting: *Deleted

## 2015-05-13 NOTE — Telephone Encounter (Signed)
Curtis Frazier  Gated Spect Myocardial Perfusion Imaging with Regadenoson Stress 1 Day Rest/Stress Protocol  Order# [id number]   Reading physician: Thayer Headings, MD  Ordering physician: Josue Hector, MD  Study date: 05/12/15      Patient Information    Name MRN Description   Curtis Frazier [id number] 74 year old Male    Result Notes    Notes Recorded by Josue Hector, MD on 05/12/2015 at 4:35 PM Normal myovue study with no evidence of ischemia or infarction         Vitals    Height Weight BMI (Calculated)   5\' 8"  (1.727 m) 183 lb (83.008 kg) 27.9    Study Highlights     The left ventricular ejection fraction is normal (55-65%).  Nuclear stress EF: 65%.  There was no ST segment deviation noted during stress.  Defect 1: There is a defect present in the apex location. This is most consistent with very mild apical thinning .  The study is normal.  This is a low risk study.     Nuclear History and Indications    History and Indications Indication for Stress Test: Evaluation of extent and severity of coronary artery disease History: CAD, 2014 MPI-EF 71% Cardiac Risk Factors: Carotid Disease, Hypertension and NIDDM  Symptoms: Chest Pain and DOE    Stress Findings    ECG Baseline ECG exhibits normal sinus rhythm..   Stress Findings A pharmacological stress test was performed using IV Lexiscan 0.4mg  over 10 seconds performed without concurrent submaximal exercise.  The patient reported shortness of breath during the stress test.   Test was stopped per protocol.   Recovery time: 5 minutes.   Response to Stress There was no ST segment deviation noted during stress.    Stress Measurements    Baseline Vitals  Rest HR 70 bpm    Rest BP 149/83 mmHg    Peak Stress Vitals  Peak HR 97 bpm    Peak BP 175/79 mmHg         Nuclear Stress Measurements    LV Systolic Volume 35 mL    TID [id number]     LV Diastolic Volume [id number] mL    LHR 0.31      SSS 11     SRS 1     SDS 10            Nuclear Stress Findings    Isotope administration Rest isotope was administered with an IV injection of 10.8 mCi Tc70m Sestamibi. Rest SPECT images were obtained approximately 45 minutes post tracer injection. Stress isotope was administered with an IV injection of 30.7 mCi Tc67m Sestamibi 20 seconds post IV Lexiscan administration. Stress SPECT images were obtained approximately 60 minutes post tracer injection.   Nuclear Study Quality Overall image quality is good.   Nuclear Measurements Study was gated.   Rest Perfusion There is a defect present in the apex location.   Stress Perfusion There is a defect present in the apex location.   Perfusion Summary Defect 1:  There is a defect present in the apex location. The defect is consistent with artifact.   Overall Study Impression Myocardial perfusion is normal. The study is normal. This is a low risk study. Overall left ventricular systolic function was normal. LV cavity size is normal. Nuclear stress EF: 65%. The left ventricular ejection fraction is normal (55-65%).   From: ACCF/SCAI/STS/AATS/AHA/ASNC/HFSA/SCCT 2012 Appropriate Use Criteria for Coronary Revascularization Focused Update    Signed  Electronically signed by Thayer Headings, MD on 05/12/15 at 1612 EST     Report approved and finalized on 05/12/2015 1612    Imaging    Imaging Information    Order-Level Documents - 05/12/2015:      Scan on 05/12/2015 3:35 PM by Cruz Condon : protocol and data sheetsScan on 05/12/2015 3:35 PM by Cruz Condon : protocol and data sheets     Scan on 05/12/2015 3:35 PM by Margot Chimes Brown-Holliman : consent formScan on 05/12/2015 3:35 PM by Cruz Condon : consent form     Scan on 05/12/2015 3:36 PM by Margot Chimes Brown-Holliman : stress imagesScan on 05/12/2015 3:36 PM by Margot Chimes Brown-Holliman : stress images     Scan on 05/12/2015 1:53 PM by  Provider Default, [place] on 05/12/2015 1:53 PM by Provider Default, MD    Encounter-Level Documents - 05/12/2015:      Electronic signature on 05/12/2015 12:05 PM : CSN: [id number]     Electronic signature on 05/12/2015 12:05 PM    Exam Information    Status Exam Begun   Exam Ended     Final [99] 05/12/2015 12:49 PM 05/12/2015 2:51 PM    External Result Report    External Result Report    Order   Myocardial Perfusion Imaging [[id number]] (Order [id number])       Procedure Abnormality Status    Myocardial Perfusion Imaging      Order Providers     Authorizing Encounter Billing    Josue Hector [facility] NM2/TREAD Wonda Cheng Nahser      Original Order     Ordered On Ordered By      05/03/2015 3:13 PM Richmond Campbell, LPN             Associated Diagnoses       ICD-9-CM ICD-10-CM    Chest pain, unspecified chest pain type    786.50 R07.9      Order Questions     Question Answer Comment    Where should this test be performed [facility])     Type of stress Exercise     Patient weight in lbs 185       Appointments for this Order     05/12/2015 12:30 PM - 15 min [facility] NM2/TREAD ([facility]) [facility] [address]      Additional Information     Associated Librarian, academic and Order Details    Collection Information      LM FOR PT  TO CALL BACK .,Adonis Housekeeper

## 2015-05-13 NOTE — Telephone Encounter (Signed)
PT  AWARE OF  MYOVIEW  RESULTS   HAS  APPT  WITH   KATHERINE ON  05-19-15 AT  2:00  PM .Adonis Housekeeper

## 2015-05-13 NOTE — Telephone Encounter (Signed)
PT AWARE OF MYOVIEW RESULTS./CY 

## 2015-05-18 NOTE — Progress Notes (Signed)
Cardiology Office Note   Date:  05/19/2015   ID:  Curtis Frazier, DOB December 09, 1940, MRN [id number]  PCP:  Curtis Pardon, MD  Cardiologist:  Curtis Frazier  F/u for chest pain/ Normal stress test     History of Present Illness: Curtis Frazier is a 74 y.o. male with a history of DM, CAD s/p BMS to LCx (2010 at [facility]), GI bleed 2/2 AVM (2014), carotid artery disease, post op PAF, mild AS,  PMR on prednisone and HLD who presents to clinic for follow up after recent nuclear stress test.   He has a BMS in his circ from [place] in 02/2009. GI bleed 2014 from AVM. 1/13 had lumbar surgery with PAF in setting of post op anemia and pain. Converted with amiodarone and continues on only beta blocker now. On prednisone taper for PMR. This has improved his hip and knee pain. Allergic to ARB/ACE. On remicade for psoriatic arthritis Trying to taper prednisone and methotrexate   He was last seen by Curtis Frazier in 10/2014 for follow up and doing well from a cardiac standpoint.    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 He was set up for a lexiscan myoview and follow up with me.   He had low risk myovue on 05/12/15: LVEF 65%. There is a defect present in the apex location. This is most consistent with very mild apical thinning. Low risk study.  Today he presents to clinic for follow up. He sometimes gets some small chest pains that only last a few minutes. He hasn't had to take any SL NTG. He also has some back pain for which he is seeing Dr. Arnoldo Frazier tomorrow for. He has had to walk with a walker lately due to this.  He has had some mild LE edema but none right now. No orthopnea or PND. No dizziness or syncope. He sometimes get palpitations from time to time, mostly when supine but it resolves quickly. No blood in urine or stool     Past Medical History  Diagnosis Date  . Other and unspecified hyperlipidemia   . Coronary atherosclerosis of  unspecified type of vessel, native or graft   . Personal history of colonic polyps   . Actinic keratosis   . Psoriatic arthropathy ([place])   . Other psoriasis   . Scoliosis (and kyphoscoliosis), idiopathic   . GI bleed 12/10    cecam AVM  . Gastritis 12/10  . Hyperlipidemia     takes Pravastatin daily  . Hypertension     takes Metoprolol daily  . Myocardial infarction ([place]) 2010  . Shortness of breath     with exertion  . Pneumonia     hx of-2008  . Headache(784.0)     occasionally  . Joint pain   . Joint swelling   . Rheumatoid arthritis(714.0)     takes Methotrexate 7pills weekly  . Degeneration of intervertebral disc, site unspecified   . Chronic back pain     scoliosis/stenosis/radiculopathy,degenerative disc disease  . Psoriasis   . Bruises easily   . Esophageal reflux     takes Omeprazole daily  . History of colonic polyps   . Hemorrhoids   . Kidney stone     hx of  . Blood transfusion 2010  . Type II or unspecified type diabetes mellitus without mention of complication, not stated as uncontrolled     type II;controlled  by diet and exercise  . PMR (polymyalgia rheumatica) (HCC) 01/20/2012    tx by specialist on a long prednisone taper  . S/P PTCA (percutaneous transluminal coronary angioplasty)     Past Surgical History  Procedure Laterality Date  . Rotator cuff repair Left 2000  . Colonoscopy    . Vasectomy  1979  . Coronary angioplasty with stent placement  02/2009    has one stent  . Carotid doppler  10/12    0-39% R and 60-79% left   . Cataract extraction  2012  . Cardiac catheterization  2010  . Lithotripsy  2009     Current Outpatient Prescriptions  Medication Sig Dispense Refill  . aspirin EC 81 MG tablet Take 81 mg by mouth daily.    . baclofen (LIORESAL) 10 MG tablet TAKE 1 TABLET BY MOUTH 2 TIMES A DAY AS NEEDED  0  . Blood Glucose Monitoring Suppl (ONE TOUCH ULTRA MINI) W/DEVICE KIT 1Pt checks blood sugar twice daily and as directed. Dx  E11.9. 1 each 0  . Calcium Carbonate-Vit D-Min (CALCIUM 1200 PO) Take 1 tablet by mouth daily.     . Cholecalciferol 1000 UNITS capsule Take 1,000 Units by mouth daily.      . clopidogrel (PLAVIX) 75 MG tablet TAKE 1 TABLET BY MOUTH EVERY DAY 30 tablet 5  . fish oil-omega-3 fatty acids 1000 MG capsule Take 1 g by mouth daily.     . folic acid (FOLVITE) 1 MG tablet Take 1 mg by mouth daily.     Marland Kitchen glipiZIDE (GLUCOTROL XL) 5 MG 24 hr tablet TAKE 1 TABLET BY MOUTH EVERY DAY WITH BREAKFAST 90 tablet 3  . glucose blood (ONE TOUCH ULTRA TEST) test strip Check blood sugar twice a day and as directed. E11.9 100 each 5  . HYDROcodone-acetaminophen (NORCO) 10-325 MG tablet Take 1 tablet by mouth every 4 (four) hours as needed for moderate pain.     . InFLIXimab (REMICADE IV) Inject into the vein.    . methotrexate (RHEUMATREX) 2.5 MG tablet Take by mouth once a week. 4 TABS WEEKLY ./CY    . metoprolol succinate (TOPROL-XL) 25 MG 24 hr tablet TAKE 1 TABLET (25 MG TOTAL) BY MOUTH DAILY. 90 tablet 2  . nitroGLYCERIN (NITROSTAT) 0.4 MG SL tablet Place 0.4 mg under the tongue every 5 (five) minutes as needed for chest pain (x 3 doses).    Marland Kitchen omeprazole (PRILOSEC) 20 MG capsule Take 20 mg by mouth daily.     Marland Kitchen omeprazole (PRILOSEC) 40 MG capsule Take 40 mg by mouth daily.      . pravastatin (PRAVACHOL) 20 MG tablet TAKE 1 TABLET BY MOUTH DAILY 30 tablet 5  . predniSONE (DELTASONE) 5 MG tablet Take 5 mg by mouth daily.     . VOLTAREN 1 % GEL APPLY 1 APPLICATION ON THE SKIN AS DIRECTED TO HIPS EVERY 6 HOURS AS NEEDED  pain  0   No current facility-administered medications for this visit.    Allergies:   Ace inhibitors; Angiotensin receptor blockers; Metoprolol; Penicillins; and Tetracycline    Social History:  The patient  reports that he has quit smoking. He has never used smokeless tobacco. He reports that he drinks alcohol. He reports that he does not use illicit drugs.   Family History:  The patient's  family history includes CAD in his mother and sister; Coronary artery disease in his mother and sister; Diabetes in his brother, brother, and mother; Hypertension in his sister; Kidney  failure in his sister; Pancreatic cancer in his brother; Prostate cancer in his brother. There is no history of Anesthesia problems, Hypotension, Malignant hyperthermia, or Pseudochol deficiency.    ROS:  Please see the history of present illness.   Otherwise, review of systems are positive for none.   All other systems are reviewed and negative.    PHYSICAL EXAM: VS:  BP 110/78 mmHg  Pulse 62  Ht $R'5\' 8"'pK$  (1.727 m)  Wt 188 lb 9.6 oz (85.548 kg)  BMI 28.68 kg/m2 , BMI Body mass index is 28.68 kg/(m^2). GEN: Well nourished, well developed, in no acute distress HEENT: normal Neck: no JVD, carotid bruits, or masses Cardiac: RRR; , rubs, or gallops,no edema soft SEM  Respiratory:  clear to auscultation bilaterally, normal work of breathing GI: soft, nontender, nondistended, + BS MS: no deformity or atrophy Skin: warm and dry, no rash Neuro:  Strength and sensation are intact Psych: euthymic mood, full affect   EKG:  EKG is ordered today. The ekg ordered today demonstrates HR 62 NSR. Notched p waves in inferior leads   Recent Labs: 11/20/2014: ALT 12; BUN 29*; Creatinine, Ser 1.44; Hemoglobin 12.5*; Platelets 261.0; Potassium 4.6; Sodium 141; TSH 0.60    Lipid Panel    Component Value Date/Time   CHOL 215* 11/20/2014 0859   TRIG 166.0* 11/20/2014 0859   HDL 40.40 11/20/2014 0859   CHOLHDL 5 11/20/2014 0859   VLDL 33.2 11/20/2014 0859   LDLCALC 141* 11/20/2014 0859   LDLDIRECT 131.3 06/28/2012 0831      Wt Readings from Last 3 Encounters:  05/19/15 188 lb 9.6 oz (85.548 kg)  05/12/15 183 lb (83.008 kg)  03/05/15 183 lb 8 oz (83.235 kg)      Other studies Reviewed: Additional studies/ records that were reviewed today include: 2D ECHO Review of the above records demonstrates:   2D ECHO:  10/23/2014 LV EF: 60-65% Study Conclusions - Left ventricle: The cavity size was normal. Wall thickness was increased in a pattern of mild LVH. Systolic function was normal. The estimated ejection fraction was in the range of 60% to 65%. Doppler parameters are consistent with abnormal left ventricular relaxation (grade 1 diastolic dysfunction). - Aortic valve: AV is thickened, calcified with mldly restricted motion. Peak and mean gradients through the valve are 32 and 18 mm Hg respectively consistent with mild AS - Mitral valve: There was mild regurgitation. - Left atrium: The atrium was mildly to moderately dilated.   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   CAD s/p distant BMS to LAD 2010 [facility] -- Recent episode of CP and f/up nuclear stress test normal.  -- Continue ASA/plavix, statin and BB  Carotid artery diease: Carotid dopplers: 0/62/69- LICA 48-54% Needs f/u duplex 10/2015  AS: heavily calcified and moderate by ECHO 10/2014. F/up ECHO in 10/2015  Arthritis: F/u Rheum trying to get off prednisone and methotrexate but hips lock up  GERD: Continue prilosec  DM: continue home regimen    Current medicines are reviewed at length with the patient today.  The patient does not have concerns regarding medicines.  The following changes have been made:  no change  Labs/ tests ordered today include:  Orders Placed This  Encounter  Procedures  . EKG 12-Lead     Disposition:   FU with Curtis Frazier after 2D ECHO and carotid dopplers in 11/2014  Signed, Eileen Stanford, PA-C  05/19/2015 2:35 PM    [facility] [address], [place], [place]  [address] Phone: [phone]; Fax: [phone]

## 2015-05-19 ENCOUNTER — Encounter: Payer: Self-pay | Admitting: Physician Assistant

## 2015-05-19 ENCOUNTER — Other Ambulatory Visit: Payer: Self-pay | Admitting: Physician Assistant

## 2015-05-19 ENCOUNTER — Ambulatory Visit (INDEPENDENT_AMBULATORY_CARE_PROVIDER_SITE_OTHER): Payer: Medicare Other | Admitting: Physician Assistant

## 2015-05-19 VITALS — BP 110/78 | HR 62 | Ht 68.0 in | Wt 188.6 lb

## 2015-05-19 DIAGNOSIS — I350 Nonrheumatic aortic (valve) stenosis: Secondary | ICD-10-CM | POA: Diagnosis not present

## 2015-05-19 DIAGNOSIS — I10 Essential (primary) hypertension: Secondary | ICD-10-CM

## 2015-05-19 DIAGNOSIS — R0789 Other chest pain: Secondary | ICD-10-CM

## 2015-05-19 DIAGNOSIS — I2510 Atherosclerotic heart disease of native coronary artery without angina pectoris: Secondary | ICD-10-CM

## 2015-05-19 DIAGNOSIS — E875 Hyperkalemia: Secondary | ICD-10-CM

## 2015-05-19 DIAGNOSIS — D649 Anemia, unspecified: Secondary | ICD-10-CM | POA: Diagnosis not present

## 2015-05-19 MED ORDER — NITROGLYCERIN 0.4 MG SL SUBL: 0.4000 mg | SUBLINGUAL_TABLET | SUBLINGUAL | Status: DC | PRN

## 2015-05-19 NOTE — Patient Instructions (Signed)
Medication Instructions:  Your physician recommends that you continue on your current medications as directed. Please refer to the Current Medication list given to you today.   Labwork: NONE ORDERED  Testing/Procedures: Your physician has requested that you have an echocardiogram in May 2017.  Echocardiography is a painless test that uses sound waves to create images of your heart. It provides your doctor with information about the size and shape of your heart and how well your heart's chambers and valves are working. This procedure takes approximately one hour. There are no restrictions for this procedure.   Your physician has requested that you have a carotid duplex in May 2017. This test is an ultrasound of the carotid arteries in your neck. It looks at blood flow through these arteries that supply the brain with blood. Allow one hour for this exam. There are no restrictions or special instructions.    Follow-Up: Your physician wants you to follow-up in: [place]. Johnsie Cancel.   You will receive a reminder letter in the mail two months in advance. If you don't receive a letter, please call our office to schedule the follow-up appointment.   Any Other Special Instructions Will Be Listed Below (If Applicable).     If you need a refill on your cardiac medications before your next appointment, please call your pharmacy.

## 2015-05-21 ENCOUNTER — Other Ambulatory Visit: Payer: Medicare Other

## 2015-05-24 ENCOUNTER — Other Ambulatory Visit (INDEPENDENT_AMBULATORY_CARE_PROVIDER_SITE_OTHER): Payer: Medicare Other

## 2015-05-24 DIAGNOSIS — I10 Essential (primary) hypertension: Secondary | ICD-10-CM | POA: Diagnosis not present

## 2015-05-24 DIAGNOSIS — E785 Hyperlipidemia, unspecified: Secondary | ICD-10-CM

## 2015-05-24 DIAGNOSIS — E119 Type 2 diabetes mellitus without complications: Secondary | ICD-10-CM | POA: Diagnosis not present

## 2015-05-24 LAB — COMPREHENSIVE METABOLIC PANEL
ALT: 20 U/L (ref 0–53)
AST: 19 U/L (ref 0–37)
Albumin: 3.8 g/dL (ref 3.5–5.2)
Alkaline Phosphatase: 54 U/L (ref 39–117)
BILIRUBIN TOTAL: 0.9 mg/dL (ref 0.2–1.2)
BUN: 25 mg/dL — ABNORMAL HIGH (ref 6–23)
CO2: 29 meq/L (ref 19–32)
Calcium: 9.3 mg/dL (ref 8.4–10.5)
Chloride: 103 mEq/L (ref 96–112)
Creatinine, Ser: 1.37 mg/dL (ref 0.40–1.50)
GFR: 53.72 mL/min — AB (ref >60.00)
GLUCOSE: 124 mg/dL — AB (ref 70–99)
POTASSIUM: 4.4 meq/L (ref 3.5–5.1)
Sodium: 139 mEq/L (ref 135–145)
Total Protein: 6.5 g/dL (ref 6.0–8.3)

## 2015-05-24 LAB — LIPID PANEL
CHOL/HDL RATIO: 4
Cholesterol: 240 mg/dL — ABNORMAL HIGH (ref 0–200)
HDL: 56.8 mg/dL (ref >39.00)
LDL CALC: 154 mg/dL — AB (ref 0–99)
NONHDL: 183.28
TRIGLYCERIDES: 144 mg/dL (ref 0.0–149.0)
VLDL: 28.8 mg/dL (ref 0.0–40.0)

## 2015-05-24 LAB — HEMOGLOBIN A1C: HEMOGLOBIN A1C: 7.6 % — AB (ref 4.6–6.5)

## 2015-05-25 ENCOUNTER — Other Ambulatory Visit: Payer: Medicare Other

## 2015-05-28 ENCOUNTER — Encounter: Payer: Self-pay | Admitting: Family Medicine

## 2015-05-28 ENCOUNTER — Ambulatory Visit (INDEPENDENT_AMBULATORY_CARE_PROVIDER_SITE_OTHER): Payer: Medicare Other | Admitting: Family Medicine

## 2015-05-28 VITALS — BP 125/70 | HR 73 | Temp 98.0°F | Wt 183.2 lb

## 2015-05-28 DIAGNOSIS — I10 Essential (primary) hypertension: Secondary | ICD-10-CM

## 2015-05-28 DIAGNOSIS — E785 Hyperlipidemia, unspecified: Secondary | ICD-10-CM | POA: Diagnosis not present

## 2015-05-28 DIAGNOSIS — I2510 Atherosclerotic heart disease of native coronary artery without angina pectoris: Secondary | ICD-10-CM | POA: Diagnosis not present

## 2015-05-28 DIAGNOSIS — E119 Type 2 diabetes mellitus without complications: Secondary | ICD-10-CM | POA: Diagnosis not present

## 2015-05-28 NOTE — Progress Notes (Signed)
Subjective:    Patient ID: Curtis Frazier, male    DOB: [birth year], 74 y.o.   MRN: [id number]  HPI Here for f/u of chronic health problems   Wt is down 3 lb with bmi of 27  Having a hard time with pain  Seeing rheumatology - Curtis Frazier    (also getting prolia shot)  Also Curtis Arnoldo Morale for neurosurgery - he increased his pain medicine - , called and told him it wasn't working  Did not give him an idea re: what is wrong  He has a back fusion   ? If he is a candidate for MRI - has rods in his back  Has to use a walker- not walking well at all  Pain is much worse on the R and it goes down his leg   On methotrexate and remicade for psoriatic arthritis  Will have remicaide soon   BP Readings from Last 3 Encounters:  05/28/15 152/82  05/19/15 110/78  03/05/15 116/62     Other than that- about the same  Wt is down about 3 lb -appetite is not great bmi of 27   Diabetes Home sugar results overall pretty good - one teens to 120s (no lows)  DM diet - fair overall  Exercise - not a lot because of severe pain/ cannot walk well Symptoms-none  A1C last  Lab Results  Component Value Date   HGBA1C 7.6* 05/24/2015  from 6.6  His prednisone was increased and then decreased again  Had a shot in his hip- ? Steroid   No problems with medications -glipizide only (avoiding metformin for kidneys)  Renal protection-cannot take ace or arb  Last eye exam -was normal in August     Cholesterol  Lab Results  Component Value Date   CHOL 240* 05/24/2015   CHOL 215* 11/20/2014   CHOL 214* 09/18/2014   Lab Results  Component Value Date   HDL 56.80 05/24/2015   HDL 40.40 11/20/2014   HDL 45.70 09/18/2014   Lab Results  Component Value Date   LDLCALC 154* 05/24/2015   LDLCALC 141* 11/20/2014   LDLCALC 147* 09/18/2014   Lab Results  Component Value Date   TRIG 144.0 05/24/2015   TRIG 166.0* 11/20/2014   TRIG 108.0 09/18/2014   Lab Results  Component Value Date   CHOLHDL 4  05/24/2015   CHOLHDL 5 11/20/2014   CHOLHDL 5 09/18/2014   Lab Results  Component Value Date   LDLDIRECT 131.3 06/28/2012   LDLDIRECT 149.9 01/19/2012   LDLDIRECT 159.0 04/29/2008   on max amt of pravastatin he can tolerate Just saw cardiology  Staying away from greasy and fatty food  One sausage biscuit    Patient Active Problem List   Diagnosis Date Noted  . Acute bronchitis 03/05/2015  . Back pain, thoracic 01/15/2015  . Chest wall pain 01/15/2015  . Routine general medical examination at a health care facility 11/23/2014  . Productive cough 10/14/2014  . Left flank pain 09/25/2014  . Renal insufficiency 11/14/2013  . Encounter for Medicare annual wellness exam 07/07/2013  . BPH (benign prostatic hyperplasia) 07/05/2012  . Prostate cancer screening 06/27/2012  . Low TSH level 01/22/2012  . PMR (polymyalgia rheumatica) (HCC) 01/20/2012  . Aortic stenosis 07/08/2011  . New onset a-fib ([place]) 07/05/2011    Class: Acute  . HYPERKALEMIA 02/18/2010  . ESSENTIAL HYPERTENSION, BENIGN 09/13/2009  . ANEMIA-UNSPECIFIED 06/10/2009  . ANGIODYSPLASIA-INTESTINE 06/10/2009  . MURMUR 05/21/2009  . Left carotid bruit 05/21/2009  .  Coronary atherosclerosis 03/09/2009  . GERD 03/09/2009  . ARTHRITIS, RHEUMATOID 04/29/2008  . HX, PERSONAL, COLONIC POLYPS 01/17/2007  . Diabetes mellitus type 2, controlled, without complications ([place]) 93/81/0175  . Elevated lipids 09/18/2006  . PSORIATIC ARTHRITIS 09/18/2006  . PSORIASIS 09/18/2006  . 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 DEGENERATIVE DISC DISEASE 09/18/2006  . SCOLIOSIS 09/18/2006  . TOBACCO ABUSE, HX OF 09/18/2006   Past Medical History  Diagnosis Date  . Other and unspecified hyperlipidemia   . Coronary atherosclerosis of unspecified type of vessel, native or graft   . Personal history of colonic polyps   . Actinic keratosis   . Psoriatic arthropathy ([place])   . Other psoriasis   . Scoliosis (and kyphoscoliosis), idiopathic   . GI  bleed 12/10    cecam AVM  . Gastritis 12/10  . Hyperlipidemia     takes Pravastatin daily  . Hypertension     takes Metoprolol daily  . Myocardial infarction ([place]) 2010  . Shortness of breath     with exertion  . Pneumonia     hx of-2008  . Headache(784.0)     occasionally  . Joint pain   . Joint swelling   . Rheumatoid arthritis(714.0)     takes Methotrexate 7pills weekly  . Degeneration of intervertebral disc, site unspecified   . Chronic back pain     scoliosis/stenosis/radiculopathy,degenerative disc disease  . Psoriasis   . Bruises easily   . Esophageal reflux     takes Omeprazole daily  . History of colonic polyps   . Hemorrhoids   . Kidney stone     hx of  . Blood transfusion 2010  . Type II or unspecified type diabetes mellitus without mention of complication, not stated as uncontrolled     type II;controlled by diet and exercise  . PMR (polymyalgia rheumatica) (HCC) 01/20/2012    tx by specialist on a long prednisone taper  . S/P PTCA (percutaneous transluminal coronary angioplasty)    Past Surgical History  Procedure Laterality Date  . Rotator cuff repair Left 2000  . Colonoscopy    . Vasectomy  1979  . Coronary angioplasty with stent placement  02/2009    has one stent  . Carotid doppler  10/12    0-39% R and 60-79% left   . Cataract extraction  2012  . Cardiac catheterization  2010  . Lithotripsy  2009   Social History  Substance Use Topics  . Smoking status: Former Research scientist (life sciences)  . Smokeless tobacco: Never Used     Comment: quit 2010  . Alcohol Use: 0.0 oz/week    0 Standard drinks or equivalent per week     Comment: occasional   Family History  Problem Relation Age of Onset  . Coronary artery disease Mother   . Diabetes Mother   . CAD Mother   . Coronary artery disease Sister   . Hypertension Sister   . Kidney failure Sister   . CAD Sister   . Diabetes Brother   . Prostate cancer Brother   . Pancreatic cancer Brother   . Diabetes Brother     . Anesthesia problems Neg Hx   . Hypotension Neg Hx   . Malignant hyperthermia Neg Hx   . Pseudochol deficiency Neg Hx    Allergies  Allergen Reactions  . Ace Inhibitors     Causes high K  . Angiotensin Receptor Blockers     Causes high K  . Metoprolol     May cause elevated K  level  . Penicillins Swelling and Rash  . Tetracycline Swelling and Rash   Current Outpatient Prescriptions on File Prior to Visit  Medication Sig Dispense Refill  . aspirin EC 81 MG tablet Take 81 mg by mouth daily.    . baclofen (LIORESAL) 10 MG tablet TAKE 1 TABLET BY MOUTH 2 TIMES A DAY AS NEEDED  0  . Blood Glucose Monitoring Suppl (ONE TOUCH ULTRA MINI) W/DEVICE KIT 1Pt checks blood sugar twice daily and as directed. Dx E11.9. 1 each 0  . Calcium Carbonate-Vit D-Min (CALCIUM 1200 PO) Take 1 tablet by mouth daily.     . Cholecalciferol 1000 UNITS capsule Take 1,000 Units by mouth daily.      . clopidogrel (PLAVIX) 75 MG tablet TAKE 1 TABLET BY MOUTH EVERY DAY 30 tablet 5  . fish oil-omega-3 fatty acids 1000 MG capsule Take 1 g by mouth daily.     . folic acid (FOLVITE) 1 MG tablet Take 1 mg by mouth daily.     Marland Kitchen glipiZIDE (GLUCOTROL XL) 5 MG 24 hr tablet TAKE 1 TABLET BY MOUTH EVERY DAY WITH BREAKFAST 90 tablet 3  . glucose blood (ONE TOUCH ULTRA TEST) test strip Check blood sugar twice a day and as directed. E11.9 100 each 5  . HYDROcodone-acetaminophen (NORCO) 10-325 MG tablet Take 1 tablet by mouth every 4 (four) hours as needed for moderate pain.     . InFLIXimab (REMICADE IV) Inject into the vein.    . methotrexate (RHEUMATREX) 2.5 MG tablet Take by mouth once a week. 4 TABS WEEKLY ./CY    . metoprolol succinate (TOPROL-XL) 25 MG 24 hr tablet TAKE 1 TABLET (25 MG TOTAL) BY MOUTH DAILY. 90 tablet 2  . nitroGLYCERIN (NITROSTAT) 0.4 MG SL tablet Place 1 tablet (0.4 mg total) under the tongue every 5 (five) minutes as needed for chest pain (x 3 doses). 25 tablet 3  . omeprazole (PRILOSEC) 20 MG capsule  Take 20 mg by mouth daily.     Marland Kitchen omeprazole (PRILOSEC) 40 MG capsule Take 40 mg by mouth daily.      . pravastatin (PRAVACHOL) 20 MG tablet TAKE 1 TABLET BY MOUTH DAILY 30 tablet 5  . predniSONE (DELTASONE) 5 MG tablet Take 5 mg by mouth daily.     . VOLTAREN 1 % GEL APPLY 1 APPLICATION ON THE SKIN AS DIRECTED TO HIPS EVERY 6 HOURS AS NEEDED  pain  0   No current facility-administered medications on file prior to visit.    Review of Systems    Review of Systems  Constitutional: Negative for fever, appetite change, fatigue and unexpected weight change.  Eyes: Negative for pain and visual disturbance.  Respiratory: Negative for cough and shortness of breath.   Cardiovascular: Negative for cp or palpitations    Gastrointestinal: Negative for nausea, diarrhea and constipation.  Genitourinary: Negative for urgency and frequency.  Skin: Negative for pallor or rash   MSK pos for back and joint pain that impair mobility Neurological: Negative for weakness, light-headedness, numbness and headaches.  Hematological: Negative for adenopathy. Does not bruise/bleed easily.  Psychiatric/Behavioral: Negative for dysphoric mood. The patient is not nervous/anxious.      Objective:   Physical Exam  Constitutional: He appears well-developed and well-nourished. No distress.  Well appearing   HENT:  Head: Normocephalic and atraumatic.  Mouth/Throat: Oropharynx is clear and moist.  Eyes: Conjunctivae and EOM are normal. Pupils are equal, round, and reactive to light.  Neck: Normal range of motion.  Neck supple. No JVD present. Carotid bruit is present. No thyromegaly present.  Cardiovascular: Normal rate and intact distal pulses.  Exam reveals no gallop.   Murmur heard. Pulmonary/Chest: Effort normal and breath sounds normal. No respiratory distress. He has no wheezes. He has no rales.  No crackles  Diffusely distant bs   Abdominal: Soft. Bowel sounds are normal. He exhibits no distension, no  abdominal bruit and no mass. There is no tenderness.  Musculoskeletal: He exhibits no edema.  Walking with walker-limited rom spine   Lymphadenopathy:    He has no cervical adenopathy.  Neurological: He is alert. He has normal reflexes.  Skin: Skin is warm and dry. No rash noted.  Psychiatric: He has a normal mood and affect.          Assessment & Plan:   Problem List Items Addressed This Visit      Cardiovascular and Mediastinum   ESSENTIAL HYPERTENSION, BENIGN - Primary    bp in fair control at this time  BP Readings from Last 1 Encounters:  05/28/15 125/70   No changes needed Disc lifstyle change with low sodium diet and exercise  Labs reviewed         Endocrine   Diabetes mellitus type 2, controlled, without complications ([place])    Lab Results  Component Value Date   HGBA1C 7.6* 05/24/2015   This is up , suspect due to recent temporary inc in prednisone  That is back down and pt states glucose levels are very good- will not inc glipizide (risk of hypoglycemia)  Re check 3 mo           Other   Elevated lipids    Pt takes pravastatin -the max dose he can tolerate  Not at goal Disc low sat fat diet  Disc goals for lipids and reasons to control them Rev labs with pt Rev low sat fat diet in detail

## 2015-05-28 NOTE — Progress Notes (Signed)
Pre visit review using our clinic review tool, if applicable. No additional management support is needed unless otherwise documented below in the visit note. 

## 2015-05-28 NOTE — Patient Instructions (Signed)
Your A1C is up - but I think that comes from the increase in prednisone you had temporarily Since your home glucose readings are improved -I do not want to increase your medicine Cholesterol is up a bit - watch your diet the best you can  (Avoid red meat/ fried foods/ egg yolks/ fatty breakfast meats/ butter, cheese and high fat dairy/ and shellfish)  See your rheumatologist and discuss your back and hip - good luck with that  The more you can come down on the prednisone the better - I hope you can tolerate lower doses   Take care of yourself   Follow up in 3 months with labs prior

## 2015-05-29 NOTE — Assessment & Plan Note (Signed)
Lab Results  Component Value Date   HGBA1C 7.6* 05/24/2015   This is up , suspect due to recent temporary inc in prednisone  That is back down and pt states glucose levels are very good- will not inc glipizide (risk of hypoglycemia)  Re check 3 mo

## 2015-05-29 NOTE — Assessment & Plan Note (Signed)
Pt takes pravastatin -the max dose he can tolerate  Not at goal Disc low sat fat diet  Disc goals for lipids and reasons to control them Rev labs with pt Rev low sat fat diet in detail

## 2015-05-29 NOTE — Assessment & Plan Note (Signed)
bp in fair control at this time  BP Readings from Last 1 Encounters:  05/28/15 125/70   No changes needed Disc lifstyle change with low sodium diet and exercise  Labs reviewed

## 2015-06-05 ENCOUNTER — Other Ambulatory Visit: Payer: Self-pay | Admitting: Family Medicine

## 2015-06-24 ENCOUNTER — Telehealth: Payer: Self-pay | Admitting: Family Medicine

## 2015-06-24 NOTE — Telephone Encounter (Signed)
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 He was told he needed a letter from Dr. Glori Bickers stating he is no longer taking Hydrocodone-Acetamin and a note from the [place] of no drug abuse. Best number to reach pt is [phone].

## 2015-06-25 ENCOUNTER — Telehealth: Payer: Self-pay | Admitting: Cardiovascular Disease

## 2015-06-25 ENCOUNTER — Telehealth: Payer: Self-pay

## 2015-06-25 NOTE — Telephone Encounter (Signed)
thanks

## 2015-06-25 NOTE — Telephone Encounter (Signed)
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

## 2015-06-25 NOTE — Telephone Encounter (Signed)
Dr. Glori Bickers, do you have access to the [place] controlled substance registry?  That would suffice for showing he is not filling controlled substances.

## 2015-06-25 NOTE — Telephone Encounter (Signed)
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

## 2015-06-25 NOTE — Telephone Encounter (Signed)
I will put the letter in the IN box  I do not have access to the DEA data base - unsure how to get that or contact them (I will cc to Adrienne to see if she knows)

## 2015-06-25 NOTE — Telephone Encounter (Signed)
Encounter closed in error.  Routing to Dr. Glori Bickers

## 2015-06-25 NOTE — Telephone Encounter (Signed)
Patient aware Letter to [employer] ready for pick up.

## 2015-06-25 NOTE — Telephone Encounter (Signed)
[name],NP was able to look pt up in database and provide requested information. Spoke to pt and informed him paperwork is available for pickup from the front desk

## 2015-06-28 ENCOUNTER — Ambulatory Visit (INDEPENDENT_AMBULATORY_CARE_PROVIDER_SITE_OTHER): Payer: Self-pay | Admitting: Urgent Care

## 2015-06-28 VITALS — BP 153/87 | HR 72 | Temp 98.6°F | Resp 16 | Ht 68.0 in | Wt 187.7 lb

## 2015-06-28 DIAGNOSIS — I519 Heart disease, unspecified: Secondary | ICD-10-CM

## 2015-06-28 DIAGNOSIS — Z021 Encounter for pre-employment examination: Secondary | ICD-10-CM

## 2015-06-28 DIAGNOSIS — Z024 Encounter for examination for driving license: Secondary | ICD-10-CM

## 2015-06-28 DIAGNOSIS — I10 Essential (primary) hypertension: Secondary | ICD-10-CM

## 2015-06-28 DIAGNOSIS — L4050 Arthropathic psoriasis, unspecified: Secondary | ICD-10-CM

## 2015-06-28 NOTE — Patient Instructions (Addendum)
Please follow up with your PCP to manage your blood pressure.   Hypertension Hypertension, commonly called high blood pressure, is when the force of blood pumping through your arteries is too strong. Your arteries are the blood vessels that carry blood from your heart throughout your body. A blood pressure reading consists of a higher number over a lower number, such as 110/72. The higher number (systolic) is the pressure inside your arteries when your heart pumps. The lower number (diastolic) is the pressure inside your arteries when your heart relaxes. Ideally you want your blood pressure below 120/80. Hypertension forces your heart to work harder to pump blood. Your arteries may become narrow or stiff. Having untreated or uncontrolled hypertension can cause heart attack, stroke, kidney disease, and other problems. RISK FACTORS Some risk factors for high blood pressure are controllable. Others are not.  Risk factors you cannot control include:   Race. You may be at higher risk if you are African American.  Age. Risk increases with age.  Gender. Men are at higher risk than women before age 43 years. After age 11, women are at higher risk than men. Risk factors you can control include:  Not getting enough exercise or physical activity.  Being overweight.  Getting too much fat, sugar, calories, or salt in your diet.  Drinking too much alcohol. SIGNS AND SYMPTOMS Hypertension does not usually cause signs or symptoms. Extremely high blood pressure (hypertensive crisis) may cause headache, anxiety, shortness of breath, and nosebleed. DIAGNOSIS To check if you have hypertension, your health care provider will measure your blood pressure while you are seated, with your arm held at the level of your heart. It should be measured at least twice using the same arm. Certain conditions can cause a difference in blood pressure between your right and left arms. A blood pressure reading that is higher  than normal on one occasion does not mean that you need treatment. If it is not clear whether you have high blood pressure, you may be asked to return on a different day to have your blood pressure checked again. Or, you may be asked to monitor your blood pressure at home for 1 or more weeks. TREATMENT Treating high blood pressure includes making lifestyle changes and possibly taking medicine. Living a healthy lifestyle can help lower high blood pressure. You may need to change some of your habits. Lifestyle changes may include:  Following the DASH diet. This diet is high in fruits, vegetables, and whole grains. It is low in salt, red meat, and added sugars.  Keep your sodium intake below 2,300 mg per day.  Getting at least 30-45 minutes of aerobic exercise at least 4 times per week.  Losing weight if necessary.  Not smoking.  Limiting alcoholic beverages.  Learning ways to reduce stress. Your health care provider may prescribe medicine if lifestyle changes are not enough to get your blood pressure under control, and if one of the following is true:  You are 29-58 years of age and your systolic blood pressure is above 140.  You are 38 years of age or older, and your systolic blood pressure is above 150.  Your diastolic blood pressure is above 90.  You have diabetes, and your systolic blood pressure is over [id number] or your diastolic blood pressure is over 90.  You have kidney disease and your blood pressure is above 140/90.  You have heart disease and your blood pressure is above 140/90. Your personal target blood pressure may vary  depending on your medical conditions, your age, and other factors. HOME CARE INSTRUCTIONS  Have your blood pressure rechecked as directed by your health care provider.   Take medicines only as directed by your health care provider. Follow the directions carefully. Blood pressure medicines must be taken as prescribed. The medicine does not work as well when  you skip doses. Skipping doses also puts you at risk for problems.  Do not smoke.   Monitor your blood pressure at home as directed by your health care provider. SEEK MEDICAL CARE IF:   You think you are having a reaction to medicines taken.  You have recurrent headaches or feel dizzy.  You have swelling in your ankles.  You have trouble with your vision. SEEK IMMEDIATE MEDICAL CARE IF:  You develop a severe headache or confusion.  You have unusual weakness, numbness, or feel faint.  You have severe chest or abdominal pain.  You vomit repeatedly.  You have trouble breathing. MAKE SURE YOU:   Understand these instructions.  Will watch your condition.  Will get help right away if you are not doing well or get worse.   This information is not intended to replace advice given to you by your health care provider. Make sure you discuss any questions you have with your health care provider.   Document Released: 05/29/2005 Document Revised: 10/13/2014 Document Reviewed: 03/21/2013 Elsevier Interactive Patient Education [employer].

## 2015-06-28 NOTE — Progress Notes (Signed)
Commercial Driver Medical Examination   Curtis Frazier is a 75 y.o. male who presents today for a DOT physical exam. The patient admits history of CAD, s/p stent placement. He is on clopidogrel, aspirin. He has been stable for years, denies chest pain, shob, diaphoresis, neck pain, limb pain, lower leg swelling. He has a history of HTN but does not recall this and is currently only on metoprolol. Manages his diabetes well with glipizide only. Tries to eat healthily. 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 This is a good source of income for the patient and does not have any issues otherwise.   He has a current medication list which includes the following prescription(s): aspirin ec, baclofen, one touch ultra mini, calcium carbonate-vit d-min, cholecalciferol, clopidogrel, fish oil-omega-3 fatty acids, folic acid, glipizide, glucose blood, infliximab, methotrexate, metoprolol succinate, nitroglycerin, omeprazole, pravastatin, prednisone, omeprazole, and voltaren.  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[place]); Other psoriasis; Scoliosis (and kyphoscoliosis), idiopathic; GI bleed (12/10); Gastritis (12/10); Hyperlipidemia; Hypertension; Myocardial infarction ([place]) (2010); Shortness of breath; Pneumonia; Headache(784.0); Joint pain; Joint swelling; Rheumatoid arthritis(714.0); Degeneration of intervertebral disc, site unspecified; Chronic back pain; Psoriasis; Bruises easily; Esophageal reflux; History of colonic polyps; Hemorrhoids; Kidney stone; Blood transfusion (2010); Type II or unspecified type diabetes mellitus without mention of complication, not stated as uncontrolled; PMR (polymyalgia rheumatica) ([facility]) (01/20/2012); and S/P PTCA (percutaneous transluminal coronary  angioplasty).  Objective:   BP 153/87 mmHg  Pulse 72  Temp(Src) 98.6 F (37 C) (Oral)  Resp 16  Ht 5\' 8"  (1.727 m)  Wt 187 lb 11.2 oz (85.14 kg)  BMI 28.55 kg/m2  SpO2 96%  Vision/hearing:  Visual Acuity Screening   Right eye Left eye Both eyes  Without correction:     With correction: 20/20 20/20 20/20   Comments: Colors:6/6 Peripheral vision: pass 85   Hearing Screening Comments: Whisper: [id number]  Applicant can recognize and distinguish among traffic control signals and devices showing standard red, green, and amber colors.  Corrective lenses required: Yes  Monocular Vision?: No  Hearing aid requirement: No  Physical Exam  Constitutional: He is oriented to person, place, and time. He appears well-developed and well-nourished.  HENT:  TM's with slight sclerosis bilaterally, no effusions or erythema. Nasal turbinates pink and moist. Throat without oropharyngeal exudates, erythema or abscesses.  Eyes: Conjunctivae and EOM are normal. Pupils are equal, round, and reactive to light. Right eye exhibits no discharge. Left eye exhibits no discharge. No scleral icterus.  Neck: Normal range of motion. Neck supple. No thyromegaly present.  Cardiovascular: Normal rate, regular rhythm and intact distal pulses.  Exam reveals no gallop and no friction rub.   Murmur (Systolic ejection murmur, grade III/VI, best heard at RUSB, not new) heard. Pulmonary/Chest: No stridor. No respiratory distress. He has no wheezes. He has no rales.  Abdominal: Soft. Bowel sounds are normal. He exhibits no distension and no mass. There is no tenderness.  Musculoskeletal: Normal range of motion. He exhibits no edema or tenderness.  Strength 5/5.  Lymphadenopathy:    He has no cervical adenopathy.  Neurological: He is alert and oriented to person, place, and time. He has normal reflexes. Coordination normal.  Skin: Skin is warm and dry. No rash noted. No erythema. No pallor.  Psychiatric: He has a normal  mood  and affect.   Assessment:    Healthy male exam.  Meets standards, but periodic monitoring required due to Hypertension.  Driver qualified only for 3 months.    Plan:    Medical examiners certificate completed and printed. Need follow-up in 3 months for recheck of uncontrolled HTN. Return as needed.   Patient declined starting HCTZ, will f/u with his PCP and recheck his BP prior to 3 months time.

## 2015-06-29 ENCOUNTER — Encounter: Payer: Self-pay | Admitting: Urgent Care

## 2015-07-14 HISTORY — PX: LUMBAR LAMINECTOMY: SHX95

## 2015-07-17 ENCOUNTER — Inpatient Hospital Stay (HOSPITAL_COMMUNITY)
Admission: EM | Admit: 2015-07-17 | Discharge: 2015-08-03 | DRG: 870 | Disposition: A | Discharge status: Other Acute Inpatient Hospital | Payer: Medicare Other | Attending: Internal Medicine | Admitting: Internal Medicine

## 2015-07-17 ENCOUNTER — Emergency Department (HOSPITAL_COMMUNITY): Payer: Medicare Other

## 2015-07-17 ENCOUNTER — Encounter (HOSPITAL_COMMUNITY): Payer: Self-pay | Admitting: *Deleted

## 2015-07-17 ENCOUNTER — Inpatient Hospital Stay (HOSPITAL_COMMUNITY): Payer: Medicare Other

## 2015-07-17 DIAGNOSIS — R410 Disorientation, unspecified: Secondary | ICD-10-CM | POA: Insufficient documentation

## 2015-07-17 DIAGNOSIS — Z9889 Other specified postprocedural states: Secondary | ICD-10-CM | POA: Diagnosis not present

## 2015-07-17 DIAGNOSIS — E876 Hypokalemia: Secondary | ICD-10-CM | POA: Diagnosis not present

## 2015-07-17 DIAGNOSIS — J320 Chronic maxillary sinusitis: Secondary | ICD-10-CM | POA: Diagnosis present

## 2015-07-17 DIAGNOSIS — G039 Meningitis, unspecified: Secondary | ICD-10-CM | POA: Diagnosis present

## 2015-07-17 DIAGNOSIS — E870 Hyperosmolality and hypernatremia: Secondary | ICD-10-CM | POA: Diagnosis not present

## 2015-07-17 DIAGNOSIS — R451 Restlessness and agitation: Secondary | ICD-10-CM | POA: Diagnosis not present

## 2015-07-17 DIAGNOSIS — N183 Chronic kidney disease, stage 3 (moderate): Secondary | ICD-10-CM | POA: Diagnosis present

## 2015-07-17 DIAGNOSIS — F419 Anxiety disorder, unspecified: Secondary | ICD-10-CM | POA: Diagnosis present

## 2015-07-17 DIAGNOSIS — I350 Nonrheumatic aortic (valve) stenosis: Secondary | ICD-10-CM | POA: Diagnosis present

## 2015-07-17 DIAGNOSIS — Z4659 Encounter for fitting and adjustment of other gastrointestinal appliance and device: Secondary | ICD-10-CM

## 2015-07-17 DIAGNOSIS — Y95 Nosocomial condition: Secondary | ICD-10-CM | POA: Diagnosis present

## 2015-07-17 DIAGNOSIS — Z794 Long term (current) use of insulin: Secondary | ICD-10-CM | POA: Diagnosis not present

## 2015-07-17 DIAGNOSIS — E871 Hypo-osmolality and hyponatremia: Secondary | ICD-10-CM | POA: Diagnosis not present

## 2015-07-17 DIAGNOSIS — Z955 Presence of coronary angioplasty implant and graft: Secondary | ICD-10-CM

## 2015-07-17 DIAGNOSIS — N390 Urinary tract infection, site not specified: Secondary | ICD-10-CM | POA: Diagnosis present

## 2015-07-17 DIAGNOSIS — Z7952 Long term (current) use of systemic steroids: Secondary | ICD-10-CM | POA: Diagnosis not present

## 2015-07-17 DIAGNOSIS — N179 Acute kidney failure, unspecified: Secondary | ICD-10-CM | POA: Diagnosis present

## 2015-07-17 DIAGNOSIS — R0602 Shortness of breath: Secondary | ICD-10-CM

## 2015-07-17 DIAGNOSIS — I129 Hypertensive chronic kidney disease with stage 1 through stage 4 chronic kidney disease, or unspecified chronic kidney disease: Secondary | ICD-10-CM | POA: Diagnosis present

## 2015-07-17 DIAGNOSIS — A419 Sepsis, unspecified organism: Secondary | ICD-10-CM | POA: Diagnosis present

## 2015-07-17 DIAGNOSIS — L4050 Arthropathic psoriasis, unspecified: Secondary | ICD-10-CM | POA: Diagnosis present

## 2015-07-17 DIAGNOSIS — Z515 Encounter for palliative care: Secondary | ICD-10-CM | POA: Diagnosis not present

## 2015-07-17 DIAGNOSIS — I5033 Acute on chronic diastolic (congestive) heart failure: Secondary | ICD-10-CM | POA: Diagnosis not present

## 2015-07-17 DIAGNOSIS — K5901 Slow transit constipation: Secondary | ICD-10-CM | POA: Diagnosis not present

## 2015-07-17 DIAGNOSIS — R0603 Acute respiratory distress: Secondary | ICD-10-CM

## 2015-07-17 DIAGNOSIS — R0600 Dyspnea, unspecified: Secondary | ICD-10-CM | POA: Insufficient documentation

## 2015-07-17 DIAGNOSIS — D649 Anemia, unspecified: Secondary | ICD-10-CM | POA: Diagnosis present

## 2015-07-17 DIAGNOSIS — E1165 Type 2 diabetes mellitus with hyperglycemia: Secondary | ICD-10-CM | POA: Diagnosis present

## 2015-07-17 DIAGNOSIS — G9340 Encephalopathy, unspecified: Secondary | ICD-10-CM | POA: Diagnosis present

## 2015-07-17 DIAGNOSIS — R7989 Other specified abnormal findings of blood chemistry: Secondary | ICD-10-CM | POA: Diagnosis present

## 2015-07-17 DIAGNOSIS — J95851 Ventilator associated pneumonia: Secondary | ICD-10-CM | POA: Insufficient documentation

## 2015-07-17 DIAGNOSIS — G9748 Accidental puncture and laceration of other nervous system organ or structure during a nervous system procedure: Secondary | ICD-10-CM | POA: Diagnosis present

## 2015-07-17 DIAGNOSIS — M353 Polymyalgia rheumatica: Secondary | ICD-10-CM | POA: Diagnosis present

## 2015-07-17 DIAGNOSIS — Z7902 Long term (current) use of antithrombotics/antiplatelets: Secondary | ICD-10-CM | POA: Diagnosis not present

## 2015-07-17 DIAGNOSIS — Z95828 Presence of other vascular implants and grafts: Secondary | ICD-10-CM

## 2015-07-17 DIAGNOSIS — Z87891 Personal history of nicotine dependence: Secondary | ICD-10-CM

## 2015-07-17 DIAGNOSIS — Y838 Other surgical procedures as the cause of abnormal reaction of the patient, or of later complication, without mention of misadventure at the time of the procedure: Secondary | ICD-10-CM | POA: Diagnosis present

## 2015-07-17 DIAGNOSIS — J9690 Respiratory failure, unspecified, unspecified whether with hypoxia or hypercapnia: Secondary | ICD-10-CM

## 2015-07-17 DIAGNOSIS — I2510 Atherosclerotic heart disease of native coronary artery without angina pectoris: Secondary | ICD-10-CM | POA: Diagnosis present

## 2015-07-17 DIAGNOSIS — R6521 Severe sepsis with septic shock: Secondary | ICD-10-CM | POA: Diagnosis present

## 2015-07-17 DIAGNOSIS — I130 Hypertensive heart and chronic kidney disease with heart failure and stage 1 through stage 4 chronic kidney disease, or unspecified chronic kidney disease: Secondary | ICD-10-CM | POA: Diagnosis present

## 2015-07-17 DIAGNOSIS — R4182 Altered mental status, unspecified: Secondary | ICD-10-CM | POA: Diagnosis present

## 2015-07-17 DIAGNOSIS — Z7189 Other specified counseling: Secondary | ICD-10-CM | POA: Diagnosis not present

## 2015-07-17 DIAGNOSIS — M79606 Pain in leg, unspecified: Secondary | ICD-10-CM

## 2015-07-17 DIAGNOSIS — Z978 Presence of other specified devices: Secondary | ICD-10-CM | POA: Insufficient documentation

## 2015-07-17 DIAGNOSIS — J9601 Acute respiratory failure with hypoxia: Secondary | ICD-10-CM | POA: Diagnosis not present

## 2015-07-17 DIAGNOSIS — I252 Old myocardial infarction: Secondary | ICD-10-CM

## 2015-07-17 DIAGNOSIS — Y793 Surgical instruments, materials and orthopedic devices (including sutures) associated with adverse incidents: Secondary | ICD-10-CM | POA: Diagnosis not present

## 2015-07-17 DIAGNOSIS — R600 Localized edema: Secondary | ICD-10-CM

## 2015-07-17 DIAGNOSIS — I480 Paroxysmal atrial fibrillation: Secondary | ICD-10-CM | POA: Diagnosis not present

## 2015-07-17 DIAGNOSIS — J189 Pneumonia, unspecified organism: Secondary | ICD-10-CM | POA: Diagnosis present

## 2015-07-17 DIAGNOSIS — J9811 Atelectasis: Secondary | ICD-10-CM | POA: Diagnosis not present

## 2015-07-17 DIAGNOSIS — J810 Acute pulmonary edema: Secondary | ICD-10-CM | POA: Diagnosis not present

## 2015-07-17 DIAGNOSIS — Z9911 Dependence on respirator [ventilator] status: Secondary | ICD-10-CM

## 2015-07-17 DIAGNOSIS — Z7982 Long term (current) use of aspirin: Secondary | ICD-10-CM

## 2015-07-17 DIAGNOSIS — M069 Rheumatoid arthritis, unspecified: Secondary | ICD-10-CM | POA: Diagnosis present

## 2015-07-17 DIAGNOSIS — I25118 Atherosclerotic heart disease of native coronary artery with other forms of angina pectoris: Secondary | ICD-10-CM | POA: Diagnosis not present

## 2015-07-17 DIAGNOSIS — I248 Other forms of acute ischemic heart disease: Secondary | ICD-10-CM | POA: Diagnosis not present

## 2015-07-17 DIAGNOSIS — E1122 Type 2 diabetes mellitus with diabetic chronic kidney disease: Secondary | ICD-10-CM | POA: Diagnosis present

## 2015-07-17 DIAGNOSIS — M549 Dorsalgia, unspecified: Secondary | ICD-10-CM | POA: Diagnosis not present

## 2015-07-17 DIAGNOSIS — Z66 Do not resuscitate: Secondary | ICD-10-CM | POA: Diagnosis present

## 2015-07-17 DIAGNOSIS — I5031 Acute diastolic (congestive) heart failure: Secondary | ICD-10-CM | POA: Diagnosis not present

## 2015-07-17 DIAGNOSIS — R778 Other specified abnormalities of plasma proteins: Secondary | ICD-10-CM | POA: Diagnosis present

## 2015-07-17 DIAGNOSIS — E785 Hyperlipidemia, unspecified: Secondary | ICD-10-CM | POA: Diagnosis present

## 2015-07-17 DIAGNOSIS — J9600 Acute respiratory failure, unspecified whether with hypoxia or hypercapnia: Secondary | ICD-10-CM | POA: Diagnosis not present

## 2015-07-17 DIAGNOSIS — I214 Non-ST elevation (NSTEMI) myocardial infarction: Secondary | ICD-10-CM | POA: Diagnosis not present

## 2015-07-17 DIAGNOSIS — Z789 Other specified health status: Secondary | ICD-10-CM | POA: Diagnosis not present

## 2015-07-17 DIAGNOSIS — G9741 Accidental puncture or laceration of dura during a procedure: Secondary | ICD-10-CM | POA: Diagnosis not present

## 2015-07-17 DIAGNOSIS — I509 Heart failure, unspecified: Secondary | ICD-10-CM | POA: Insufficient documentation

## 2015-07-17 DIAGNOSIS — M4120 Other idiopathic scoliosis, site unspecified: Secondary | ICD-10-CM | POA: Diagnosis present

## 2015-07-17 DIAGNOSIS — I4891 Unspecified atrial fibrillation: Secondary | ICD-10-CM | POA: Diagnosis not present

## 2015-07-17 DIAGNOSIS — D696 Thrombocytopenia, unspecified: Secondary | ICD-10-CM | POA: Diagnosis present

## 2015-07-17 DIAGNOSIS — E119 Type 2 diabetes mellitus without complications: Secondary | ICD-10-CM

## 2015-07-17 DIAGNOSIS — J181 Lobar pneumonia, unspecified organism: Secondary | ICD-10-CM

## 2015-07-17 DIAGNOSIS — I10 Essential (primary) hypertension: Secondary | ICD-10-CM | POA: Diagnosis not present

## 2015-07-17 DIAGNOSIS — J80 Acute respiratory distress syndrome: Secondary | ICD-10-CM | POA: Diagnosis not present

## 2015-07-17 DIAGNOSIS — Z0189 Encounter for other specified special examinations: Secondary | ICD-10-CM

## 2015-07-17 DIAGNOSIS — I481 Persistent atrial fibrillation: Secondary | ICD-10-CM | POA: Diagnosis not present

## 2015-07-17 LAB — COMPREHENSIVE METABOLIC PANEL
ALT: 18 U/L (ref 17–63)
AST: 42 U/L — AB (ref 15–41)
Albumin: 2.9 g/dL — ABNORMAL LOW (ref 3.5–5.0)
Alkaline Phosphatase: 43 U/L (ref 38–126)
Anion gap: 9 (ref 5–15)
BUN: 18 mg/dL (ref 6–20)
CHLORIDE: 106 mmol/L (ref 101–111)
CO2: 22 mmol/L (ref 22–32)
CREATININE: 1.76 mg/dL — AB (ref 0.61–1.24)
Calcium: 8.1 mg/dL — ABNORMAL LOW (ref 8.9–10.3)
GFR calc non Af Amer: 36 mL/min — ABNORMAL LOW (ref >60)
GFR, EST AFRICAN AMERICAN: 42 mL/min — AB (ref >60)
Glucose, Bld: 74 mg/dL (ref 65–99)
Potassium: 4.1 mmol/L (ref 3.5–5.1)
SODIUM: 137 mmol/L (ref 135–145)
TOTAL PROTEIN: 5.8 g/dL — AB (ref 6.5–8.1)
Total Bilirubin: 1 mg/dL (ref 0.3–1.2)

## 2015-07-17 LAB — CBG MONITORING, ED
GLUCOSE-CAPILLARY: 58 mg/dL — AB (ref 65–99)
GLUCOSE-CAPILLARY: 67 mg/dL (ref 65–99)
Glucose-Capillary: 57 mg/dL — ABNORMAL LOW (ref 65–99)

## 2015-07-17 LAB — CBC WITH DIFFERENTIAL/PLATELET
BASOS ABS: 0.2 10*3/uL — AB (ref 0.0–0.1)
BASOS PCT: 2 %
EOS ABS: 0 10*3/uL (ref 0.0–0.7)
EOS PCT: 0 %
HCT: 31.3 % — ABNORMAL LOW (ref 39.0–52.0)
HEMOGLOBIN: 10.1 g/dL — AB (ref 13.0–17.0)
LYMPHS ABS: 1.3 10*3/uL (ref 0.7–4.0)
Lymphocytes Relative: 16 %
MCH: 31.8 pg (ref 26.0–34.0)
MCHC: 32.3 g/dL (ref 30.0–36.0)
MCV: 98.4 fL (ref 78.0–100.0)
Monocytes Absolute: 1.6 10*3/uL — ABNORMAL HIGH (ref 0.1–1.0)
Monocytes Relative: 19 %
NEUTROS PCT: 63 %
Neutro Abs: 5.4 10*3/uL (ref 1.7–7.7)
PLATELETS: 156 10*3/uL (ref 150–400)
RBC: 3.18 MIL/uL — AB (ref 4.22–5.81)
RDW: 16.1 % — ABNORMAL HIGH (ref 11.5–15.5)
WBC: 8.5 10*3/uL (ref 4.0–10.5)

## 2015-07-17 LAB — URINALYSIS, ROUTINE W REFLEX MICROSCOPIC
Bilirubin Urine: NEGATIVE
Glucose, UA: NEGATIVE mg/dL
Ketones, ur: NEGATIVE mg/dL
LEUKOCYTES UA: NEGATIVE
NITRITE: NEGATIVE
PROTEIN: 30 mg/dL — AB
SPECIFIC GRAVITY, URINE: 1.016 (ref 1.005–1.030)
pH: 5 (ref 5.0–8.0)

## 2015-07-17 LAB — INFLUENZA PANEL BY PCR (TYPE A & B)
H1N1 flu by pcr: NOT DETECTED
INFLAPCR: NEGATIVE
INFLBPCR: NEGATIVE

## 2015-07-17 LAB — TROPONIN I: Troponin I: 0.55 ng/mL (ref <0.031)

## 2015-07-17 LAB — URINE MICROSCOPIC-ADD ON
RBC / HPF: NONE SEEN RBC/hpf (ref 0–5)
Squamous Epithelial / LPF: NONE SEEN
WBC, UA: NONE SEEN WBC/hpf (ref 0–5)

## 2015-07-17 LAB — I-STAT CG4 LACTIC ACID, ED
LACTIC ACID, VENOUS: 1.14 mmol/L (ref 0.5–2.0)
LACTIC ACID, VENOUS: 0.85 mmol/L (ref 0.5–2.0)

## 2015-07-17 MED ORDER — DEXTROSE 5 % IV SOLN
2.0000 g | Freq: Two times a day (BID) | INTRAVENOUS | Status: DC
  Administered 2015-07-18: 2 g via INTRAVENOUS
  Filled 2015-07-17 (×2): qty 2

## 2015-07-17 MED ORDER — FOLIC ACID 1 MG PO TABS
1.0000 mg | ORAL_TABLET | Freq: Every day | ORAL | Status: DC
  Administered 2015-07-18 – 2015-07-20 (×3): 1 mg via ORAL
  Filled 2015-07-17 (×4): qty 1

## 2015-07-17 MED ORDER — ASPIRIN EC 81 MG PO TBEC
81.0000 mg | DELAYED_RELEASE_TABLET | Freq: Every day | ORAL | Status: DC
  Administered 2015-07-18 – 2015-07-20 (×3): 81 mg via ORAL
  Filled 2015-07-17 (×4): qty 1

## 2015-07-17 MED ORDER — ALUM & MAG HYDROXIDE-SIMETH 200-200-20 MG/5ML PO SUSP
30.0000 mL | Freq: Four times a day (QID) | ORAL | Status: DC | PRN
  Filled 2015-07-17: qty 30

## 2015-07-17 MED ORDER — IOHEXOL 350 MG/ML SOLN
80.0000 mL | Freq: Once | INTRAVENOUS | Status: AC | PRN
  Administered 2015-07-17: 100 mL via INTRAVENOUS

## 2015-07-17 MED ORDER — HYDROMORPHONE HCL 1 MG/ML IJ SOLN
0.5000 mg | INTRAMUSCULAR | Status: DC | PRN
  Administered 2015-07-17 – 2015-07-19 (×4): 1 mg via INTRAVENOUS
  Filled 2015-07-17 (×4): qty 1

## 2015-07-17 MED ORDER — DEXTROSE 50 % IV SOLN
1.0000 | Freq: Once | INTRAVENOUS | Status: AC
  Administered 2015-07-17: 50 mL via INTRAVENOUS
  Filled 2015-07-17: qty 50

## 2015-07-17 MED ORDER — DEXTROSE 5 % IV SOLN
2.0000 g | Freq: Once | INTRAVENOUS | Status: AC
  Administered 2015-07-17: 2 g via INTRAVENOUS
  Filled 2015-07-17: qty 2

## 2015-07-17 MED ORDER — AMPICILLIN SODIUM 2 G IJ SOLR
2.0000 g | Freq: Once | INTRAMUSCULAR | Status: AC
  Administered 2015-07-17: 2 g via INTRAVENOUS
  Filled 2015-07-17: qty 2000

## 2015-07-17 MED ORDER — SODIUM CHLORIDE 0.9 % IV SOLN
2.0000 g | INTRAVENOUS | Status: DC
  Administered 2015-07-17 – 2015-07-18 (×3): 2 g via INTRAVENOUS
  Filled 2015-07-17 (×6): qty 2000

## 2015-07-17 MED ORDER — FENTANYL CITRATE (PF) 100 MCG/2ML IJ SOLN
50.0000 ug | Freq: Once | INTRAMUSCULAR | Status: AC
  Administered 2015-07-17: 50 ug via INTRAVENOUS
  Filled 2015-07-17: qty 2

## 2015-07-17 MED ORDER — SODIUM CHLORIDE 0.9 % IV SOLN
INTRAVENOUS | Status: AC
  Administered 2015-07-17: via INTRAVENOUS

## 2015-07-17 MED ORDER — ENOXAPARIN SODIUM 40 MG/0.4ML ~~LOC~~ SOLN
40.0000 mg | SUBCUTANEOUS | Status: DC
  Administered 2015-07-18: 40 mg via SUBCUTANEOUS
  Filled 2015-07-17: qty 0.4

## 2015-07-17 MED ORDER — ONDANSETRON HCL 4 MG/2ML IJ SOLN: 4.0000 mg | Freq: Four times a day (QID) | INTRAMUSCULAR | Status: DC | PRN

## 2015-07-17 MED ORDER — ONDANSETRON HCL 4 MG PO TABS: 4.0000 mg | ORAL_TABLET | Freq: Four times a day (QID) | ORAL | Status: DC | PRN

## 2015-07-17 MED ORDER — PRAVASTATIN SODIUM 20 MG PO TABS
20.0000 mg | ORAL_TABLET | Freq: Every day | ORAL | Status: DC
  Administered 2015-07-18 – 2015-07-20 (×3): 20 mg via ORAL
  Filled 2015-07-17 (×4): qty 1

## 2015-07-17 MED ORDER — SODIUM CHLORIDE 0.9 % IV BOLUS (SEPSIS)
1000.0000 mL | Freq: Once | INTRAVENOUS | Status: AC
  Administered 2015-07-17: 1000 mL via INTRAVENOUS

## 2015-07-17 MED ORDER — PANTOPRAZOLE SODIUM 40 MG PO TBEC
40.0000 mg | DELAYED_RELEASE_TABLET | Freq: Every day | ORAL | Status: DC
  Administered 2015-07-18 – 2015-07-20 (×3): 40 mg via ORAL
  Filled 2015-07-17 (×3): qty 1

## 2015-07-17 MED ORDER — CLOPIDOGREL BISULFATE 75 MG PO TABS
75.0000 mg | ORAL_TABLET | Freq: Every day | ORAL | Status: DC
  Administered 2015-07-18 – 2015-07-19 (×2): 75 mg via ORAL
  Filled 2015-07-17 (×2): qty 1

## 2015-07-17 MED ORDER — ACETAMINOPHEN 325 MG PO TABS
650.0000 mg | ORAL_TABLET | Freq: Four times a day (QID) | ORAL | Status: DC | PRN
  Administered 2015-07-20 – 2015-07-21 (×2): 650 mg via ORAL
  Filled 2015-07-17: qty 2

## 2015-07-17 MED ORDER — VANCOMYCIN HCL IN DEXTROSE 1-5 GM/200ML-% IV SOLN
1000.0000 mg | Freq: Once | INTRAVENOUS | Status: AC
Start: 2015-07-17 — End: 2015-07-17
  Administered 2015-07-17: 1000 mg via INTRAVENOUS
  Filled 2015-07-17: qty 200

## 2015-07-17 MED ORDER — INSULIN ASPART 100 UNIT/ML ~~LOC~~ SOLN: 0.0000 [IU] | SUBCUTANEOUS | Status: DC

## 2015-07-17 MED ORDER — NITROGLYCERIN 0.4 MG SL SUBL: 0.4000 mg | SUBLINGUAL_TABLET | SUBLINGUAL | Status: DC | PRN

## 2015-07-17 MED ORDER — METOPROLOL SUCCINATE ER 25 MG PO TB24
25.0000 mg | ORAL_TABLET | Freq: Every day | ORAL | Status: DC
Start: 2015-07-18 — End: 2015-07-18
  Administered 2015-07-18: 25 mg via ORAL
  Filled 2015-07-17: qty 1

## 2015-07-17 MED ORDER — SODIUM CHLORIDE 0.9 % IV BOLUS (SEPSIS): 1000.0000 mL | Freq: Once | INTRAVENOUS | Status: AC

## 2015-07-17 MED ORDER — SODIUM CHLORIDE 0.9 % IV SOLN
INTRAVENOUS | Status: DC
  Administered 2015-07-18: 05:00:00 via INTRAVENOUS

## 2015-07-17 MED ORDER — BACLOFEN 10 MG PO TABS
10.0000 mg | ORAL_TABLET | Freq: Two times a day (BID) | ORAL | Status: DC | PRN
  Administered 2015-07-18: 10 mg via ORAL
  Filled 2015-07-17 (×2): qty 1

## 2015-07-17 MED ORDER — VANCOMYCIN HCL IN DEXTROSE 750-5 MG/150ML-% IV SOLN
750.0000 mg | Freq: Two times a day (BID) | INTRAVENOUS | Status: DC
  Administered 2015-07-18 – 2015-07-26 (×17): 750 mg via INTRAVENOUS
  Filled 2015-07-17 (×18): qty 150

## 2015-07-17 MED ORDER — VITAMIN D 1000 UNITS PO TABS
1000.0000 [IU] | ORAL_TABLET | Freq: Every day | ORAL | Status: DC
  Administered 2015-07-18 – 2015-07-20 (×3): 1000 [IU] via ORAL
  Filled 2015-07-17 (×4): qty 1

## 2015-07-17 MED ORDER — ACETAMINOPHEN 650 MG RE SUPP
650.0000 mg | Freq: Four times a day (QID) | RECTAL | Status: DC | PRN
  Administered 2015-07-19: 650 mg via RECTAL
  Filled 2015-07-17 (×2): qty 1

## 2015-07-17 MED ORDER — OXYCODONE HCL 5 MG PO TABS
5.0000 mg | ORAL_TABLET | ORAL | Status: DC | PRN
  Administered 2015-07-18: 5 mg via ORAL
  Filled 2015-07-17: qty 1

## 2015-07-17 NOTE — ED Provider Notes (Signed)
CSN: [id number]     Arrival date & time 07/17/15  40 History   First MD Initiated Contact with Patient 07/17/15 1723     Chief Complaint  Patient presents with  . Altered Mental Status  . Fever     (Consider location/radiation/quality/duration/timing/severity/associated sxs/prior Treatment) HPI The patient had a lumbar back surgery done 2 days earlier. He was not having any immediate problems. Family members were helping him in the home.Marland Kitchen His daughter checked him today and found that he was confused and could not follow normal instructions. He also seemed very weak generally when they try to assist him to the bathroom. The patient does not localize any worsening pain in his back. He does however report that that has been ongoing problem even before his surgery and pain radiates into his legs. He does not note this is a significant change from his presurgical pain. Per EMS the patient's temperature was 102 on arrival. He was given Tylenol. He was also hypoxic at 86% on room air. Supplemental oxygen was given.  Past Medical History  Diagnosis Date  . Other and unspecified hyperlipidemia   . Coronary atherosclerosis of unspecified type of vessel, native or graft   . Personal history of colonic polyps   . Actinic keratosis   . 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 Other psoriasis   . Scoliosis (and kyphoscoliosis), idiopathic   . GI bleed 12/10    cecam AVM  . Gastritis 12/10  . Hyperlipidemia     takes Pravastatin daily  . Hypertension     takes Metoprolol daily  . Myocardial infarction ([place]) 2010  . Shortness of breath     with exertion  . Pneumonia     hx of-2008  . Headache(784.0)     occasionally  . Joint pain   . Joint swelling   . Rheumatoid arthritis(714.0)     takes Methotrexate 7pills weekly  . Degeneration of intervertebral disc, site unspecified   . Chronic back pain     scoliosis/stenosis/radiculopathy,degenerative disc disease  . Psoriasis   . Bruises easily   .  Esophageal reflux     takes Omeprazole daily  . History of colonic polyps   . Hemorrhoids   . Kidney stone     hx of  . Blood transfusion 2010  . Type II or unspecified type diabetes mellitus without mention of complication, not stated as uncontrolled     type II;controlled by diet and exercise  . PMR (polymyalgia rheumatica) (HCC) 01/20/2012    tx by specialist on a long prednisone taper  . S/P PTCA (percutaneous transluminal coronary angioplasty)    Past Surgical History  Procedure Laterality Date  . Rotator cuff repair Left 2000  . Colonoscopy    . Vasectomy  1979  . Coronary angioplasty with stent placement  02/2009    has one stent  . Carotid doppler  10/12    0-39% R and 60-79% left   . Cataract extraction  2012  . Cardiac catheterization  2010  . Lithotripsy  2009   Family History  Problem Relation Age of Onset  . Coronary artery disease Mother   . Diabetes Mother   . CAD Mother   . Coronary artery disease Sister   . Hypertension Sister   . Kidney failure Sister   . CAD Sister   . Diabetes Brother   . Prostate cancer Brother   . Pancreatic cancer Brother   . Diabetes Brother   . Anesthesia problems Neg Hx   .  Hypotension Neg Hx   . Malignant hyperthermia Neg Hx   . Pseudochol deficiency Neg Hx    Social History  Substance Use Topics  . Smoking status: Former Research scientist (life sciences)  . Smokeless tobacco: Never Used     Comment: quit 2010  . Alcohol Use: 0.0 oz/week    0 Standard drinks or equivalent per week     Comment: occasional    Review of Systems 10 Systems reviewed and are negative for acute change except as noted in the HPI.    Allergies  Ace inhibitors; Angiotensin receptor blockers; Metoprolol; Penicillins; and Tetracycline  Home Medications   Prior to Admission medications   Medication Sig Start Date End Date Taking? Authorizing Provider  aspirin EC 81 MG tablet Take 81 mg by mouth daily.   Yes Historical Provider, MD  Calcium Carbonate-Vit D-Min  (CALCIUM 1200 PO) Take 1 tablet by mouth daily.    Yes Historical Provider, MD  Cholecalciferol 1000 UNITS capsule Take 1,000 Units by mouth daily.     Yes Historical Provider, MD  clopidogrel (PLAVIX) 75 MG tablet TAKE 1 TABLET BY MOUTH EVERY DAY 06/08/15  Yes Abner Greenspan, MD  cyclobenzaprine (FLEXERIL) 10 MG tablet Take 10 mg by mouth 3 (three) times daily as needed for muscle spasms.   Yes Historical Provider, MD  fish oil-omega-3 fatty acids 1000 MG capsule Take 1 g by mouth daily.    Yes Historical Provider, MD  folic acid (FOLVITE) 1 MG tablet Take 1 mg by mouth daily.  05/18/15  Yes Historical Provider, MD  glipiZIDE (GLUCOTROL XL) 5 MG 24 hr tablet TAKE 1 TABLET BY MOUTH EVERY DAY WITH BREAKFAST 08/24/14  Yes Marne A Tower, MD  InFLIXimab (REMICADE IV) Inject 100 mg into the vein See admin instructions. Takes every 2 months   Yes Historical Provider, MD  methotrexate (RHEUMATREX) 2.5 MG tablet Take by mouth once a week. 4 TABS WEEKLY ./CY   Yes Historical Provider, MD  metoprolol succinate (TOPROL-XL) 25 MG 24 hr tablet TAKE 1 TABLET (25 MG TOTAL) BY MOUTH DAILY. 04/07/15  Yes Abner Greenspan, MD  nitroGLYCERIN (NITROSTAT) 0.4 MG SL tablet Place 1 tablet (0.4 mg total) under the tongue every 5 (five) minutes as needed for chest pain (x 3 doses). 05/19/15  Yes Eileen Stanford, PA-C  omeprazole (PRILOSEC) 20 MG capsule Take 20 mg by mouth daily.  05/18/15  Yes Historical Provider, MD  oxyCODONE-acetaminophen (PERCOCET) 10-325 MG tablet Take 0.5-1 tablets by mouth every 6 (six) hours as needed for pain.  07/15/15  Yes Historical Provider, MD  pravastatin (PRAVACHOL) 20 MG tablet TAKE 1 TABLET BY MOUTH DAILY 04/27/15  Yes Abner Greenspan, MD  predniSONE (DELTASONE) 5 MG tablet Take 5 mg by mouth daily.  04/29/14  Yes Historical Provider, MD  VOLTAREN 1 % GEL Takes 1 application topically daily as needed for pain 05/08/15  Yes Historical Provider, MD   BP 123/83 mmHg  Pulse 122  Temp(Src) 98.4 F  (36.9 C) (Oral)  Resp 28  Ht 5\' 8"  (1.727 m)  Wt 204 lb 3.2 oz (92.625 kg)  BMI 31.06 kg/m2  SpO2 88% Physical Exam  Constitutional: He appears well-developed and well-nourished.  Patient is moderately ill in appearance. He is awake and alert but mildly confused. He does appear pale and has dyspnea.  HENT:  Head: Normocephalic and atraumatic.  Nose: Nose normal.  Mouth/Throat: Oropharynx is clear and moist.  Eyes: EOM are normal. Pupils are equal, round, and reactive  to light.  Neck: Neck supple.  Cardiovascular: Regular rhythm, normal heart sounds and intact distal pulses.   Tachycardia.  Pulmonary/Chest: Breath sounds normal.  Mild tachypnea. Air flow is good. No gross wheeze or rhonchi.  Abdominal: Soft. Bowel sounds are normal. He exhibits no distension. There is no tenderness.  Musculoskeletal: Normal range of motion. He exhibits no edema or tenderness.  Visualization of surgical wound in the lower back shows to be clean dry and intact. No surrounding cellulitis.  Neurological: He is alert. He has normal strength. Coordination normal. GCS eye subscore is 4. GCS verbal subscore is 5. GCS motor subscore is 6.  Patient is alert but mildly confused. His speech is clear but some of the content is incorrect per family members. He is following commands appropriately for cranial nerves function and upper extremity function. Some limitations to elevating lower extremities due to back pain. He however can assist in raising his hips to remove his garments and rolling to the side. There is no localization dysfunction or lower extremities.  Skin: Skin is warm and intact.  Patients for his slightly diaphoretic.  Psychiatric: He has a normal mood and affect.    ED Course  Procedures (including critical care time) CRITICAL CARE Performed by: Charlesetta Shanks   Total critical care time: 60 minutes  Critical care time was exclusive of separately billable procedures and treating other  patients.  Critical care was necessary to treat or prevent imminent or life-threatening deterioration.  Critical care was time spent personally by me on the following activities: development of treatment plan with patient and/or surrogate as well as nursing, discussions with consultants, evaluation of patient's response to treatment, examination of patient, obtaining history from patient or surrogate, ordering and performing treatments and interventions, ordering and review of laboratory studies, ordering and review of radiographic studies, pulse oximetry and re-evaluation of patient's condition. Labs Review Labs Reviewed  COMPREHENSIVE METABOLIC PANEL - Abnormal; Notable for the following:    Creatinine, Ser 1.76 (*)    Calcium 8.1 (*)    Total Protein 5.8 (*)    Albumin 2.9 (*)    AST 42 (*)    GFR calc non Af Amer 36 (*)    GFR calc Af Amer 42 (*)    All other components within normal limits  CBC WITH DIFFERENTIAL/PLATELET - Abnormal; Notable for the following:    RBC 3.18 (*)    Hemoglobin 10.1 (*)    HCT 31.3 (*)    RDW 16.1 (*)    Monocytes Absolute 1.6 (*)    Basophils Absolute 0.2 (*)    All other components within normal limits  URINALYSIS, ROUTINE W REFLEX MICROSCOPIC (NOT AT [facility]) - Abnormal; Notable for the following:    Hgb urine dipstick TRACE (*)    Protein, ur 30 (*)    All other components within normal limits  TROPONIN I - Abnormal; Notable for the following:    Troponin I 0.55 (*)    All other components within normal limits  URINE MICROSCOPIC-ADD ON - Abnormal; Notable for the following:    Bacteria, UA RARE (*)    Casts HYALINE CASTS (*)    All other components within normal limits  BASIC METABOLIC PANEL - Abnormal; Notable for the following:    CO2 20 (*)    Creatinine, Ser 1.45 (*)    Calcium 7.6 (*)    GFR calc non Af Amer 46 (*)    GFR calc Af Amer 53 (*)  All other components within normal limits  CBC - Abnormal; Notable for the following:    RBC  2.97 (*)    Hemoglobin 9.7 (*)    HCT 29.6 (*)    RDW 16.5 (*)    Platelets 134 (*)    All other components within normal limits  GLUCOSE, CAPILLARY - Abnormal; Notable for the following:    Glucose-Capillary 58 (*)    All other components within normal limits  GLUCOSE, CAPILLARY - Abnormal; Notable for the following:    Glucose-Capillary 128 (*)    All other components within normal limits  GLUCOSE, CAPILLARY - Abnormal; Notable for the following:    Glucose-Capillary 133 (*)    All other components within normal limits  BLOOD GAS, ARTERIAL - Abnormal; Notable for the following:    pCO2 arterial 30.3 (*)    pO2, Arterial 55.0 (*)    Bicarbonate 18.5 (*)    Acid-base deficit 5.4 (*)    All other components within normal limits  TROPONIN I - Abnormal; Notable for the following:    Troponin I 1.01 (*)    All other components within normal limits  TROPONIN I - Abnormal; Notable for the following:    Troponin I 2.06 (*)    All other components within normal limits  HEPARIN LEVEL (UNFRACTIONATED) - Abnormal; Notable for the following:    Heparin Unfractionated <0.10 (*)    All other components within normal limits  BRAIN NATRIURETIC PEPTIDE - Abnormal; Notable for the following:    B Natriuretic Peptide 618.1 (*)    All other components within normal limits  GLUCOSE, CAPILLARY - Abnormal; Notable for the following:    Glucose-Capillary 140 (*)    All other components within normal limits  GLUCOSE, CAPILLARY - Abnormal; Notable for the following:    Glucose-Capillary 169 (*)    All other components within normal limits  GLUCOSE, CAPILLARY - Abnormal; Notable for the following:    Glucose-Capillary 174 (*)    All other components within normal limits  BLOOD GAS, ARTERIAL - Abnormal; Notable for the following:    pCO2 arterial 29.7 (*)    pO2, Arterial 60.4 (*)    Bicarbonate 18.1 (*)    Acid-base deficit 5.8 (*)    All other components within normal limits  CBG MONITORING, ED  - Abnormal; Notable for the following:    Glucose-Capillary 57 (*)    All other components within normal limits  CBG MONITORING, ED - Abnormal; Notable for the following:    Glucose-Capillary 58 (*)    All other components within normal limits  CULTURE, BLOOD (ROUTINE X 2)  CULTURE, BLOOD (ROUTINE X 2)  URINE CULTURE  MRSA PCR SCREENING  INFLUENZA PANEL BY PCR (TYPE A & B, H1N1)  LACTIC ACID, PLASMA  LACTIC ACID, PLASMA  PROCALCITONIN  PROTIME-INR  APTT  GLUCOSE, CAPILLARY  GLUCOSE, CAPILLARY  GLUCOSE, CAPILLARY  TSH  GLUCOSE, CAPILLARY  HEMOGLOBIN [id number]  BASIC METABOLIC PANEL  CBC  I-STAT CG4 LACTIC ACID, ED  I-STAT CG4 LACTIC ACID, ED  I-STAT CG4 LACTIC ACID, ED  I-STAT CG4 LACTIC ACID, ED  CBG MONITORING, ED    Imaging Review I have personally reviewed and evaluated these images and lab results as part of my medical decision-making.   EKG Interpretation   Date/Time:  Saturday July 17 2015 20:23:30 EST Ventricular Rate:  96 PR Interval:  143 QRS Duration: 93 QT Interval:  368 QTC Calculation: 465 R Axis:   37 Text Interpretation:  Sinus rhythm Probable left atrial enlargement Low  voltage, extremity leads Borderline ST elevation, inferior leads ED  PHYSICIAN INTERPRETATION AVAILABLE IN [facility] Confirmed by TEST,  Record ([id number]) on 07/18/2015 8:37:53 AM     Consult:(18:57) Patient's case is reviewed with Dr.Nundkumar. At this time he advises to continue with sepsis management but of lower probability to be a wound infection this time. Consult hospitalist. Plan for admission. MDM   Final diagnoses:  Sepsis, due to unspecified organism [facility])  Disorientation    Patient presented as outlined above. Findings are consistent with sepsis. Sepsis protocol is initiated. Primary source at this time is considered to be pulmonary. I have reviewed the case with the on-call neurosurgeon and he feels that this is of low likelihood to be wound infection this early  in the course. CT chest was obtained to rule out for pulmonary embolus with patient having hypoxia and a episode of significant weakness or near syncope. No PE identified. Patient be admitted for ongoing treatment.   Charlesetta Shanks, MD 07/19/15 Laureen Abrahams

## 2015-07-17 NOTE — Progress Notes (Signed)
ANTIBIOTIC CONSULT NOTE - INITIAL  Pharmacy Consult for vancomycin, ampicillin, ceftriaxone Indication: meningitis  Allergies  Allergen Reactions  . Ace Inhibitors     Causes high K  . Angiotensin Receptor Blockers     Causes high K  . Metoprolol     May cause elevated K level  . Penicillins Swelling and Rash  . Tetracycline Swelling and Rash    Patient Measurements: Height: 5\' 8"  (172.7 cm) Weight: 185 lb (83.915 kg) IBW/kg (Calculated) : 68.4  Vital Signs: Temp: 100 F (37.8 C) (02/04 1723) Temp Source: Oral (02/04 1723) BP: 101/63 mmHg (02/04 1723) Pulse Rate: 109 (02/04 1723) Intake/Output from previous day:   Intake/Output from this shift:    Labs: No results for input(s): WBC, HGB, PLT, LABCREA, CREATININE in the last 72 hours. CrCl cannot be calculated (Patient has no serum creatinine result on file.). No results for input(s): VANCOTROUGH, VANCOPEAK, VANCORANDOM, GENTTROUGH, GENTPEAK, GENTRANDOM, TOBRATROUGH, TOBRAPEAK, TOBRARND, AMIKACINPEAK, AMIKACINTROU, AMIKACIN in the last 72 hours.   Microbiology: No results found for this or any previous visit (from the past 720 hour(s)).  Medical History: Past Medical History  Diagnosis Date  . Other and unspecified hyperlipidemia   . Coronary atherosclerosis of unspecified type of vessel, native or graft   . Personal history of colonic polyps   . Actinic keratosis   . 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 Other psoriasis   . Scoliosis (and kyphoscoliosis), idiopathic   . GI bleed 12/10    cecam AVM  . Gastritis 12/10  . Hyperlipidemia     takes Pravastatin daily  . Hypertension     takes Metoprolol daily  . Myocardial infarction ([place]) 2010  . Shortness of breath     with exertion  . Pneumonia     hx of-2008  . Headache(784.0)     occasionally  . Joint pain   . Joint swelling   . Rheumatoid arthritis(714.0)     takes Methotrexate 7pills weekly  . Degeneration of intervertebral disc, site unspecified   .  Chronic back pain     scoliosis/stenosis/radiculopathy,degenerative disc disease  . Psoriasis   . Bruises easily   . Esophageal reflux     takes Omeprazole daily  . History of colonic polyps   . Hemorrhoids   . Kidney stone     hx of  . Blood transfusion 2010  . Type II or unspecified type diabetes mellitus without mention of complication, not stated as uncontrolled     type II;controlled by diet and exercise  . PMR (polymyalgia rheumatica) (HCC) 01/20/2012    tx by specialist on a long prednisone taper  . S/P PTCA (percutaneous transluminal coronary angioplasty)    Assessment: 75 year old male admitted to [place] with alttered mental status. Patient now r/o for meningitis. Tmax 100, no cbc yet. Code sepsis called.  Goal of Therapy:  Vancomycin trough level 15-20 mcg/ml  Plan:  Measure antibiotic drug levels at steady state Follow up culture results Ceftriaxone 2g q12 hours  Ampicillin 2g IV q4 hours Vancomycin 750mg  IV q12 after initial 1g load  Erin Hearing PharmD., BCPS Clinical Pharmacist Pager [phone] 07/17/2015 6:03 PM

## 2015-07-17 NOTE — ED Notes (Signed)
Per [facility] - pt from home - per pt's family pt awoke this morning "not acting right" has been experiencing acute confusion. Pt w/ back surgery x2 days ago, [facility] report temp of 102.0 on arrival and pt given 1gm APAP, pt also w/ SPO2 of 86% on room air, pt placed on simple mask at 6L/min and SPO2 up to 96% - pts RR 25 - EMS initiated sepsis protocol and also administered 1071ml NS bolus. Pt oriented to location, disoriented to situation and time on arrival to department.

## 2015-07-17 NOTE — ED Notes (Signed)
Patient transported to CT 

## 2015-07-17 NOTE — ED Notes (Signed)
Dr. Arnoldo Morale notified of CBG 57 - pt remains alert, in no acute distress - pt appropriate to eat and given sandwich, apple sauce, and graham crackers with water.

## 2015-07-17 NOTE — H&P (Signed)
[facility] Admission History and Physical       Curtis Frazier PJK:[id number] DOB: 1941/06/07 DOA: 07/17/2015  Referring physician: EDP PCP: Loura Pardon, MD  Specialists:   Chief Complaint: Fever and Confusion  HPI: Curtis Frazier is a 75 y.o. male with recent Lumbar Laminectomy 2 days ago, history of CAD, HTN, DM2, Hyperlipidemia, and RA who presents to the ED with complaints of confusion since awakening this AM, and Fever to 102.   His family reports that he was not acting right.   He has had non-productive cough, and SOB, and was found by EMS to have hypoxia to 86% and was placed on NCO2 and improved to 96%.   He was also given tylenol for his fever by EMS on route.   In the ED a Sepsis workup was initiatied, and he was placed on empiric antibiotics for possible CNS infection due to his recent surgery.   HIs Chest X-ray however revealed possible early LLL pneumonia.    He was referred for admission.  Neurosurgery on call was consulted by the EDP .      Review of Systems:  Constitutional: No Weight Loss, No Weight Gain, Night Sweats,+Fevers, +Chills, Dizziness, Light Headedness, Fatigue, +Generalized Weakness HEENT: No Headaches, Difficulty Swallowing,Tooth/Dental Problems,Sore Throat,  No Sneezing, Rhinitis, Ear Ache, Nasal Congestion, or Post Nasal Drip,  Cardio-vascular:  No Chest pain, Orthopnea, PND, Edema in Lower Extremities, Anasarca, Dizziness, Palpitations  Resp:  +Dyspnea, No DOE, +Non-Productive Cough, No Hemoptysis, No Wheezing.    GI: No Heartburn, Indigestion, Abdominal Pain, Nausea, Vomiting, Diarrhea, Constipation, Hematemesis, Hematochezia, Melena, Change in Bowel Habits,  Loss of Appetite  GU: No Dysuria, No Change in Color of Urine, No Urgency or Urinary Frequency, No Flank pain.  Musculoskeletal: No Joint Pain or Swelling, No Decreased Range of Motion, No Back Pain.  Neurologic: No Syncope, No Seizures, Muscle Weakness, Paresthesia, Vision Disturbance or Loss, No  Diplopia, No Vertigo, No Difficulty Walking,  Skin: No Rash or Lesions. Psych: No Change in Mood or Affect, No Depression or Anxiety, No Memory loss, +Confusion, or Hallucinations   Past Medical History  Diagnosis Date  . Other and unspecified hyperlipidemia   . Coronary atherosclerosis of unspecified type of vessel, native or graft   . Personal history of colonic polyps   . Actinic keratosis   . 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 Other psoriasis   . Scoliosis (and kyphoscoliosis), idiopathic   . GI bleed 12/10    cecam AVM  . Gastritis 12/10  . Hyperlipidemia     takes Pravastatin daily  . Hypertension     takes Metoprolol daily  . Myocardial infarction ([place]) 2010  . Shortness of breath     with exertion  . Pneumonia     hx of-2008  . Headache(784.0)     occasionally  . Joint pain   . Joint swelling   . Rheumatoid arthritis(714.0)     takes Methotrexate 7pills weekly  . Degeneration of intervertebral disc, site unspecified   . Chronic back pain     scoliosis/stenosis/radiculopathy,degenerative disc disease  . Psoriasis   . Bruises easily   . Esophageal reflux     takes Omeprazole daily  . History of colonic polyps   . Hemorrhoids   . Kidney stone     hx of  . Blood transfusion 2010  . Type II or unspecified type diabetes mellitus without mention of complication, not stated as uncontrolled     type II;controlled by diet  and exercise  . PMR (polymyalgia rheumatica) (HCC) 01/20/2012    tx by specialist on a long prednisone taper  . S/P PTCA (percutaneous transluminal coronary angioplasty)      Past Surgical History  Procedure Laterality Date  . Rotator cuff repair Left 2000  . Colonoscopy    . Vasectomy  1979  . Coronary angioplasty with stent placement  02/2009    has one stent  . Carotid doppler  10/12    0-39% R and 60-79% left   . Cataract extraction  2012  . Cardiac catheterization  2010  . Lithotripsy  2009      Prior to Admission medications     Medication Sig Start Date End Date Taking? Authorizing Provider  aspirin EC 81 MG tablet Take 81 mg by mouth daily.    Historical Provider, MD  baclofen (LIORESAL) 10 MG tablet TAKE 1 TABLET BY MOUTH 2 TIMES A DAY AS NEEDED 05/08/15   Historical Provider, MD  Blood Glucose Monitoring Suppl (ONE TOUCH ULTRA MINI) W/DEVICE KIT 1Pt checks blood sugar twice daily and as directed. Dx E11.9. 04/17/14   Abner Greenspan, MD  Calcium Carbonate-Vit D-Min (CALCIUM 1200 PO) Take 1 tablet by mouth daily.     Historical Provider, MD  Cholecalciferol 1000 UNITS capsule Take 1,000 Units by mouth daily.      Historical Provider, MD  clopidogrel (PLAVIX) 75 MG tablet TAKE 1 TABLET BY MOUTH EVERY DAY 06/08/15   Abner Greenspan, MD  fish oil-omega-3 fatty acids 1000 MG capsule Take 1 g by mouth daily.     Historical Provider, MD  folic acid (FOLVITE) 1 MG tablet Take 1 mg by mouth daily.  05/18/15   Historical Provider, MD  glipiZIDE (GLUCOTROL XL) 5 MG 24 hr tablet TAKE 1 TABLET BY MOUTH EVERY DAY WITH BREAKFAST 08/24/14   Marne A Tower, MD  glucose blood (ONE TOUCH ULTRA TEST) test strip Check blood sugar twice a day and as directed. E11.9 04/17/14   Abner Greenspan, MD  InFLIXimab (REMICADE IV) Inject into the vein.    Historical Provider, MD  methotrexate (RHEUMATREX) 2.5 MG tablet Take by mouth once a week. 4 TABS WEEKLY ./CY    Historical Provider, MD  metoprolol succinate (TOPROL-XL) 25 MG 24 hr tablet TAKE 1 TABLET (25 MG TOTAL) BY MOUTH DAILY. 04/07/15   Abner Greenspan, MD  nitroGLYCERIN (NITROSTAT) 0.4 MG SL tablet Place 1 tablet (0.4 mg total) under the tongue every 5 (five) minutes as needed for chest pain (x 3 doses). 05/19/15   Eileen Stanford, PA-C  omeprazole (PRILOSEC) 20 MG capsule Take 20 mg by mouth daily.  05/18/15   Historical Provider, MD  omeprazole (PRILOSEC) 40 MG capsule Take 40 mg by mouth daily. Reported on 06/28/2015    Historical Provider, MD  pravastatin (PRAVACHOL) 20 MG tablet TAKE 1 TABLET BY  MOUTH DAILY 04/27/15   Abner Greenspan, MD  predniSONE (DELTASONE) 5 MG tablet Take 5 mg by mouth daily.  04/29/14   Historical Provider, MD  VOLTAREN 1 % GEL Reported on 06/28/2015 05/08/15   Historical Provider, MD     Allergies  Allergen Reactions  . Ace Inhibitors     Causes high K  . Angiotensin Receptor Blockers     Causes high K  . Metoprolol     May cause elevated K level  . Penicillins Swelling and Rash  . Tetracycline Swelling and Rash    Social History:  reports that he  has quit smoking. He has never used smokeless tobacco. He reports that he drinks alcohol. He reports that he does not use illicit drugs.    Family History  Problem Relation Age of Onset  . Coronary artery disease Mother   . Diabetes Mother   . CAD Mother   . Coronary artery disease Sister   . Hypertension Sister   . Kidney failure Sister   . CAD Sister   . Diabetes Brother   . Prostate cancer Brother   . Pancreatic cancer Brother   . Diabetes Brother   . Anesthesia problems Neg Hx   . Hypotension Neg Hx   . Malignant hyperthermia Neg Hx   . Pseudochol deficiency Neg Hx        Physical Exam:  GEN:  Pleasant Well Nourished and Well Developed Elderly  75 y.o. Caucasian male examined and in no acute distress; cooperative with exam Filed Vitals:   07/17/15 1945 07/17/15 2000 07/17/15 2015 07/17/15 2030  BP: 97/61 95/68 111/65 117/81  Pulse: 87 88 98 92  Temp:      TempSrc:      Resp: 15 17 27 23   Height:      Weight:      SpO2: 98% 91% 91% 97%   Blood pressure 117/81, pulse 92, temperature 100 F (37.8 C), temperature source Oral, resp. rate 23, height 5' 8"  (1.727 m), weight 83.915 kg (185 lb), SpO2 97 %. PSYCH: ]He is alert and oriented x4; does not appear anxious does not appear depressed; affect is normal HEENT: Normocephalic and Atraumatic, Mucous membranes pink; PERRLA; EOM intact; Fundi:  Benign;  No scleral icterus, Nares: Patent, Oropharynx: Clear, Fair Dentition,    Neck:  FROM,  No Cervical Lymphadenopathy nor Thyromegaly or Carotid Bruit; No JVD; Breasts:: Not examined CHEST WALL: No tenderness CHEST: Normal respiration, clear to auscultation bilaterally HEART: Regular rate and rhythm; no murmurs rubs or gallops BACK: No kyphosis or scoliosis; No CVA tenderness ABDOMEN: Positive Bowel Sounds, Obese, Soft Non-Tender, No Rebound or Guarding; No Masses, No Organomegaly Rectal Exam: Not done EXTREMITIES: No Cyanosis, Clubbing, or Edema; No Ulcerations. Genitalia: not examined PULSES: 2+ and symmetric SKIN: Normal hydration no rash or ulceration CNS:  Alert and Oriented x 4, No Focal Deficits Vascular: pulses palpable throughout    Labs on Admission:  Basic Metabolic Panel:  Recent Labs Lab 07/17/15 1750  NA 137  K 4.1  CL 106  CO2 22  GLUCOSE 74  BUN 18  CREATININE 1.76*  CALCIUM 8.1*   Liver Function Tests:  Recent Labs Lab 07/17/15 1750  AST 42*  ALT 18  ALKPHOS 43  BILITOT 1.0  PROT 5.8*  ALBUMIN 2.9*   No results for input(s): LIPASE, AMYLASE in the last 168 hours. No results for input(s): AMMONIA in the last 168 hours. CBC:  Recent Labs Lab 07/17/15 1750  WBC 8.5  NEUTROABS 5.4  HGB 10.1*  HCT 31.3*  MCV 98.4  PLT 156   Cardiac Enzymes:  Recent Labs Lab 07/17/15 1750  TROPONINI 0.55*    BNP (last 3 results) No results for input(s): BNP in the last 8760 hours.  ProBNP (last 3 results) No results for input(s): PROBNP in the last 8760 hours.  CBG: No results for input(s): GLUCAP in the last 168 hours.  Radiological Exams on Admission: Ct Head Wo Contrast  07/17/2015  CLINICAL DATA:  pt awoke this morning "not acting right" has been experiencing acute confusion. Pt w/ back surgery x2 days ago,  FEVER 102oriented to location, disoriented to situation and time EXAM: CT HEAD WITHOUT CONTRAST TECHNIQUE: Contiguous axial images were obtained from the base of the skull through the vertex without intravenous contrast.  COMPARISON:  None. FINDINGS: Severe diffuse age-related atrophy with moderate low attenuation in the deep white matter. No evidence of vascular territory infarct or mass. No hemorrhage or extra-axial fluid. No hydrocephalus. The calvarium is intact. There is near complete opacification of the right maxillary sinus. Several bilateral ethmoid air cells are opacified or show inflammatory change particularly on the right. Several mastoid air cells on the right are opacified. IMPRESSION: Age-related involutional change. Significant inflammatory change involving the maxillary sinus, with inflammatory change also involving right mastoid air cells and ethmoid air cells. Electronically Signed   By: Skipper Cliche M.D.   On: 07/17/2015 19:55   Dg Chest Port 1 View  07/17/2015  CLINICAL DATA:  Pt w/ back surgery x2 days ago, [facility] report temp of 102.0 on arrival and pt given 1gm APAP, pt also w/ SPO2 of 86% on room air, pt placed on simple mask at 6L/min and SPO2 up to 96%. Pt now under sepsis protocol and is experiencing acute confusion EXAM: PORTABLE CHEST 1 VIEW COMPARISON:  01/15/2015 FINDINGS: Heart is mildly enlarged. There are no focal consolidations or pleural effusions. There is mild left lower lobe subsegmental atelectasis or scarring. Less likely this could represent early infectious infiltrate. No evidence for pulmonary edema. Previous thoracic spine fusion. IMPRESSION: 1. Cardiomegaly without pulmonary edema. 2. Left lower lobe atelectasis versus early infiltrate. Followup is recommended. Electronically Signed   By: Nolon Nations M.D.   On: 07/17/2015 18:06     EKG: Independently reviewed. Normal Sinus Rhythm rate =         Assessment/Plan:    75 y.o. male with  Principal Problem:   1.    Sepsis ([place])- Probable Sites of Infection:  LLL Pneumonia, and or Maxillary Sinusitis, but covering for CNS infection   IV Vancomycin, Rocephin, and Ampicillin per Pharmacy Consult   Sepsis Protocol  Initiated   IVFs       Active Problems:   2.    Acute encephalopathy- due to #1, and Hypoxia   NCO2 PRN    Monitor O2 Sats   Monitor Sxs, appear to be improving     3.    LLL pneumonia- Early findings on Chest X-ray, + Clinical Sxs   On IV Abx for Sepsis    Adjust Pending Culture Results and Sxs     4.    S/P lumbar laminectomy- No signs of Wound Infection   NeuroSurgery consulted to see in AM     5.    Elevated troponin   CTA Chest ordered and Pending    If + for PE start IV Heparin drip     6.    Coronary atherosclerosis   Cardiac Monitoring    Cycle Troponins   continue Metoprolol, Plavix and ASA Rx       7.    Rheumatoid arthritis (HCC)   On Methotrexate weekly and Prednisone daily Rx   Verify      8.    Type II or unspecified type diabetes mellitus without mention of complication, not stated as uncontrolled   SSI coverage PRN   CBG checks q 4 hrs   Diabetic/Heart Healthy Diet   Check HbA1C in AM     9.    Essential hypertension, benign   On Metoprolol Rx Hold if SBP <  100 or HR < 60   10.    DVT Prophylaxis   Lovenox     Code Status:     FULL CODE       Family Communication:   Daughter , Son In-Law, and Grandson at Bedside   Disposition Plan:    Inpatient  Status        Time spent:  [facility] Pager [phone]   If [place] Please Contact the Day Rounding Team MD for [facility]  If 7PM-7AM, Please Contact Night-Floor Coverage  [email or web address] Password [facility] 07/17/2015, 8:48 PM     ADDENDUM:   Patient was seen and examined on 07/17/2015

## 2015-07-18 ENCOUNTER — Inpatient Hospital Stay (HOSPITAL_COMMUNITY): Payer: Medicare Other

## 2015-07-18 DIAGNOSIS — R7989 Other specified abnormal findings of blood chemistry: Secondary | ICD-10-CM

## 2015-07-18 DIAGNOSIS — J9601 Acute respiratory failure with hypoxia: Secondary | ICD-10-CM

## 2015-07-18 DIAGNOSIS — I214 Non-ST elevation (NSTEMI) myocardial infarction: Secondary | ICD-10-CM

## 2015-07-18 DIAGNOSIS — J810 Acute pulmonary edema: Secondary | ICD-10-CM

## 2015-07-18 DIAGNOSIS — I2510 Atherosclerotic heart disease of native coronary artery without angina pectoris: Secondary | ICD-10-CM

## 2015-07-18 DIAGNOSIS — I4891 Unspecified atrial fibrillation: Secondary | ICD-10-CM

## 2015-07-18 LAB — LACTIC ACID, PLASMA
LACTIC ACID, VENOUS: 1 mmol/L (ref 0.5–2.0)
LACTIC ACID, VENOUS: 1.2 mmol/L (ref 0.5–2.0)

## 2015-07-18 LAB — GLUCOSE, CAPILLARY
GLUCOSE-CAPILLARY: 128 mg/dL — AB (ref 65–99)
GLUCOSE-CAPILLARY: 133 mg/dL — AB (ref 65–99)
GLUCOSE-CAPILLARY: 140 mg/dL — AB (ref 65–99)
GLUCOSE-CAPILLARY: 70 mg/dL (ref 65–99)
GLUCOSE-CAPILLARY: 71 mg/dL (ref 65–99)
GLUCOSE-CAPILLARY: 73 mg/dL (ref 65–99)
Glucose-Capillary: 169 mg/dL — ABNORMAL HIGH (ref 65–99)
Glucose-Capillary: 174 mg/dL — ABNORMAL HIGH (ref 65–99)
Glucose-Capillary: 58 mg/dL — ABNORMAL LOW (ref 65–99)
Glucose-Capillary: 75 mg/dL (ref 65–99)

## 2015-07-18 LAB — BLOOD GAS, ARTERIAL
ACID-BASE DEFICIT: 5.4 mmol/L — AB (ref 0.0–2.0)
ACID-BASE DEFICIT: 5.8 mmol/L — AB (ref 0.0–2.0)
BICARBONATE: 18.5 meq/L — AB (ref 20.0–24.0)
Bicarbonate: 18.1 mEq/L — ABNORMAL LOW (ref 20.0–24.0)
DRAWN BY: 23604
Drawn by: 331761
FIO2: 0.4
FIO2: 0.55
O2 Saturation: 88.2 %
O2 Saturation: 90.5 %
PATIENT TEMPERATURE: 98.6
PCO2 ART: 29.7 mmHg — AB (ref 35.0–45.0)
PH ART: 7.403 (ref 7.350–7.450)
Patient temperature: 98.6
TCO2: 19.5 mmol/L (ref 0–100)
TCO2: 19 mmol/L (ref 0–100)
pCO2 arterial: 30.3 mmHg — ABNORMAL LOW (ref 35.0–45.0)
pH, Arterial: 7.402 (ref 7.350–7.450)
pO2, Arterial: 55 mmHg — ABNORMAL LOW (ref 80.0–100.0)
pO2, Arterial: 60.4 mmHg — ABNORMAL LOW (ref 80.0–100.0)

## 2015-07-18 LAB — PROCALCITONIN: Procalcitonin: 1.36 ng/mL

## 2015-07-18 LAB — BASIC METABOLIC PANEL
ANION GAP: 13 (ref 5–15)
BUN: 16 mg/dL (ref 6–20)
CALCIUM: 7.6 mg/dL — AB (ref 8.9–10.3)
CO2: 20 mmol/L — ABNORMAL LOW (ref 22–32)
CREATININE: 1.45 mg/dL — AB (ref 0.61–1.24)
Chloride: 103 mmol/L (ref 101–111)
GFR, EST AFRICAN AMERICAN: 53 mL/min — AB (ref >60)
GFR, EST NON AFRICAN AMERICAN: 46 mL/min — AB (ref >60)
GLUCOSE: 65 mg/dL (ref 65–99)
Potassium: 3.9 mmol/L (ref 3.5–5.1)
Sodium: 136 mmol/L (ref 135–145)

## 2015-07-18 LAB — CBC
HCT: 29.6 % — ABNORMAL LOW (ref 39.0–52.0)
HEMOGLOBIN: 9.7 g/dL — AB (ref 13.0–17.0)
MCH: 32.7 pg (ref 26.0–34.0)
MCHC: 32.8 g/dL (ref 30.0–36.0)
MCV: 99.7 fL (ref 78.0–100.0)
PLATELETS: 134 10*3/uL — AB (ref 150–400)
RBC: 2.97 MIL/uL — AB (ref 4.22–5.81)
RDW: 16.5 % — ABNORMAL HIGH (ref 11.5–15.5)
WBC: 9 10*3/uL (ref 4.0–10.5)

## 2015-07-18 LAB — TSH: TSH: 0.678 u[IU]/mL (ref 0.350–4.500)

## 2015-07-18 LAB — HEPARIN LEVEL (UNFRACTIONATED): Heparin Unfractionated: <0.1 IU/mL — ABNORMAL LOW (ref 0.30–0.70)

## 2015-07-18 LAB — MRSA PCR SCREENING: MRSA by PCR: NEGATIVE

## 2015-07-18 LAB — PROTIME-INR
INR: 1.17 (ref 0.00–1.49)
Prothrombin Time: 15 seconds (ref 11.6–15.2)

## 2015-07-18 LAB — BRAIN NATRIURETIC PEPTIDE: B NATRIURETIC PEPTIDE 5: 618.1 pg/mL — AB (ref 0.0–100.0)

## 2015-07-18 LAB — TROPONIN I
TROPONIN I: 1.01 ng/mL — AB (ref <0.031)
Troponin I: 2.06 ng/mL (ref <0.031)

## 2015-07-18 LAB — APTT: aPTT: 34 seconds (ref 24–37)

## 2015-07-18 MED ORDER — SODIUM CHLORIDE 0.9 % IV SOLN
INTRAVENOUS | Status: DC
  Administered 2015-07-18 – 2015-07-19 (×2): via INTRAVENOUS

## 2015-07-18 MED ORDER — LORAZEPAM 2 MG/ML IJ SOLN
0.5000 mg | Freq: Once | INTRAMUSCULAR | Status: AC
  Administered 2015-07-18: 0.5 mg via INTRAVENOUS

## 2015-07-18 MED ORDER — FUROSEMIDE 10 MG/ML IJ SOLN
40.0000 mg | Freq: Once | INTRAMUSCULAR | Status: AC
  Administered 2015-07-18: 40 mg via INTRAVENOUS
  Filled 2015-07-18: qty 4

## 2015-07-18 MED ORDER — DILTIAZEM HCL 100 MG IV SOLR
INTRAVENOUS | Status: AC
  Filled 2015-07-18: qty 100

## 2015-07-18 MED ORDER — HEPARIN BOLUS VIA INFUSION
4000.0000 [IU] | Freq: Once | INTRAVENOUS | Status: DC
  Filled 2015-07-18: qty 4000

## 2015-07-18 MED ORDER — DILTIAZEM LOAD VIA INFUSION
5.0000 mg | Freq: Once | INTRAVENOUS | Status: AC
  Administered 2015-07-18: 5 mg via INTRAVENOUS
  Filled 2015-07-18: qty 5

## 2015-07-18 MED ORDER — DEXTROSE 50 % IV SOLN
1.0000 | Freq: Once | INTRAVENOUS | Status: AC
  Administered 2015-07-18: 50 mL via INTRAVENOUS

## 2015-07-18 MED ORDER — DILTIAZEM HCL 100 MG IV SOLR
5.0000 mg/h | INTRAVENOUS | Status: DC
  Administered 2015-07-18: 10 mg/h via INTRAVENOUS
  Administered 2015-07-18 – 2015-07-19 (×2): 5 mg/h via INTRAVENOUS
  Administered 2015-07-20 (×2): 10 mg/h via INTRAVENOUS
  Administered 2015-07-20: 12.5 mg/h via INTRAVENOUS
  Filled 2015-07-18 (×7): qty 100

## 2015-07-18 MED ORDER — METOPROLOL TARTRATE 12.5 MG HALF TABLET
12.5000 mg | ORAL_TABLET | Freq: Two times a day (BID) | ORAL | Status: DC
  Administered 2015-07-18 – 2015-07-20 (×4): 12.5 mg via ORAL
  Filled 2015-07-18 (×4): qty 1

## 2015-07-18 MED ORDER — INSULIN ASPART 100 UNIT/ML ~~LOC~~ SOLN
0.0000 [IU] | Freq: Three times a day (TID) | SUBCUTANEOUS | Status: DC
  Administered 2015-07-18: 2 [IU] via SUBCUTANEOUS
  Administered 2015-07-18: 1 [IU] via SUBCUTANEOUS
  Administered 2015-07-19: 2 [IU] via SUBCUTANEOUS
  Administered 2015-07-19 (×2): 1 [IU] via SUBCUTANEOUS
  Administered 2015-07-20: 3 [IU] via SUBCUTANEOUS
  Administered 2015-07-20 (×2): 2 [IU] via SUBCUTANEOUS

## 2015-07-18 MED ORDER — DEXTROSE 10 % IV SOLN
INTRAVENOUS | Status: DC
  Administered 2015-07-18: 04:00:00 via INTRAVENOUS

## 2015-07-18 MED ORDER — SODIUM CHLORIDE 0.9% FLUSH
10.0000 mL | Freq: Two times a day (BID) | INTRAVENOUS | Status: DC
  Administered 2015-07-18 – 2015-07-22 (×6): 10 mL
  Administered 2015-07-23: 20 mL
  Administered 2015-07-23: 10 mL
  Administered 2015-07-24: 20 mL
  Administered 2015-07-25 – 2015-07-30 (×10): 10 mL
  Administered 2015-07-31: 40 mL
  Administered 2015-07-31 – 2015-08-03 (×6): 10 mL

## 2015-07-18 MED ORDER — LORAZEPAM 2 MG/ML IJ SOLN
INTRAMUSCULAR | Status: AC
  Filled 2015-07-18: qty 1

## 2015-07-18 MED ORDER — DEXTROSE 50 % IV SOLN
INTRAVENOUS | Status: AC
  Administered 2015-07-18: 50 mL
  Filled 2015-07-18: qty 50

## 2015-07-18 MED ORDER — DEXTROSE 5 % IV SOLN
2.0000 g | INTRAVENOUS | Status: DC
  Administered 2015-07-18 – 2015-07-25 (×8): 2 g via INTRAVENOUS
  Filled 2015-07-18 (×11): qty 2

## 2015-07-18 MED ORDER — LEVALBUTEROL HCL 0.63 MG/3ML IN NEBU
0.6300 mg | INHALATION_SOLUTION | Freq: Four times a day (QID) | RESPIRATORY_TRACT | Status: DC | PRN
  Administered 2015-07-18: 0.63 mg via RESPIRATORY_TRACT
  Filled 2015-07-18: qty 3

## 2015-07-18 MED ORDER — SODIUM CHLORIDE 0.9% FLUSH
10.0000 mL | INTRAVENOUS | Status: DC | PRN
  Administered 2015-07-19: 10 mL
  Filled 2015-07-18: qty 40

## 2015-07-18 MED ORDER — HEPARIN (PORCINE) IN NACL 100-0.45 UNIT/ML-% IJ SOLN: 1250.0000 [IU]/h | INTRAMUSCULAR | Status: DC

## 2015-07-18 NOTE — Progress Notes (Signed)
No issues overnight. Pt has mild SOB, does c/o back soreness. Admitted yesterday with altered mental status, fever at home. They do not report any wound drainage at home.  EXAM:  BP 118/67 mmHg  Pulse 113  Temp(Src) 99.5 F (37.5 C) (Oral)  Resp 34  Ht 5\' 8"  (1.727 m)  Wt 92.625 kg (204 lb 3.2 oz)  BMI 31.06 kg/m2  SpO2 91%  Awake, alert, oriented  Speech fluent, appropriate  CN grossly intact  5/5 BUE/BLE  Wound c/d/i, no evidence of infection  IMPRESSION:  75 y.o. male with SIRS/Sepsis POD#3 from lumbar laminectomy. Wound unlikely to be source of infection, likely LLL pneumonia  PLAN: - Cont abx for pneumonia, tx per primary service.

## 2015-07-18 NOTE — Progress Notes (Signed)
Pt. very restless, increased work of breathing, showing signs of anxiety, RR in 30-40s. Placed pt on NRB and called RT to assess pt. Paged T. Rogue Bussing, NP, orders received for Xopenex nebulizer and a one time dose of Ativan. Will continue to monitor.  UPDATE 2300 - per pt's son pt is hallucinating, pt still having increased work of breathing, RR in 30-40s, sats in high 80s, low 90s. Paged T. Rogue Bussing, NP, orders received for ABG. Will continue to monitor closely.

## 2015-07-18 NOTE — Progress Notes (Signed)
HYPOGLYCEMIC EVENT  CBG: 58  TREATMENT: 69ml D50IV  SYMPTOMS: SHAKINESS, ANXIETY,IRRITABILITY  FOLLOW-UP CBG:    Time:  [id number] CBG Result:133  POSSIBLE REASONS FOR EVENT: Poor PO intake  COMMENTS/MD NOTIFIED:  ORDER IN PLACE FOR HYPOGLYCEMIC SIDEBAR TO USE MD NOT NOTIFIED PER ORDER TO NOTIFY ONLY IF SEVERE

## 2015-07-18 NOTE — Progress Notes (Addendum)
PROGRESS NOTE  Curtis Frazier [id number] DOB: July 25, 1940 DOA: 07/17/2015 PCP: Loura Pardon, MD Brief History 75 year old male with a history of coronary artery disease status post stent placement, hypertension, hyperlipidemia, diabetes mellitus, rheumatoid arthritis presented with confusion as reported by his family. The patient also had a fever 100.64F with persistent nonproductive cough and shortness of breath. He was noted to be hypoxic by EMS with oxygen saturation of 86%. This improved with supplement oxygen. Workup in the emergency department revealed serum creatinine 1.76, the WBC 8.5, lactic acid 1.4, and a chest x-ray that suggested left lower lobe atelectasis versus infiltrate. Notably, the patient recently had lumbar laminectomy that was performed 2 days prior to this admission. Neurosurgery was consulted from the emergency department due to possible concerns of postoperative infection. EKG shows sinus tachycardia with nonspecific ST-T wave changes. CT antrum of the chest was negative for embolus and showed bibasilar atelectasis. The patient was started on gemcitabine about intravenous fluids for presumptive pneumonia.  On the morning of 07/18/15, the patient developed respiratory distress and oxygen desaturation Assessment/Plan: Sepsis -Presented at the time of admission -Continue intravenous antibiotics pending culture data -Continue intravenous fluids  -Lactic acid 1.4  -Procalcitonin 1.36  -Influenza PCR negative  Acute respiratory failure with hypoxia -Presently stable on his cannula -secondary to pulmonary edema and ?pneumonia/aspiration -03/15/2016 CT intermittent chest negative for pulmonary embolus -07/18/15 ABG 7.403/30/55/19 (0.4) although on 40% FiO2 only approx 5 min Pulmonary edema -pt received 30cc/kg fluid for sepsis and additional fluid for sepsis evening of 07/17/15 -saline lock IVF -07/18/15 CXR with increased vascular congestion when compared to 01/15/15 -lasix  IV -Echo -daily weights Afib with RVR -new onset am 07/18/15 -start diltiazem drip -start heparin gtt -consult cardiology -CHADSVASc = 5 -continue metoprolol succinate -TSH -07/18/15 EKG--afib HR 141, ST depression V3-V6 -07/17/15 troponin 0.55-->continue to cycle Acute encephalopathy  -multifactorial Secondary to infectious process and new onset afib -07/17/2015 CT brain negative for acute process  -Urinalysis negative for pyuria  Acute on chronic renal failure (CKD3) -secondary to sepsis -baseline creatinine 1.2-1.4 Status post lumbar laminectomy  -Discussed with Dr. Kathyrn Sheriff (Neurosurgery) -doubt CNS infection Diabetes mellitus type 2 -NovoLog sliding scale -05/24/2015 hemoglobin A1c 7.6  Hypertension -Hold antihypertensive medications due to soft blood pressure Elevated troponin -No anginal symptoms Coronary artery disease  -Continue aspirin and plavix -05/12/2015 Myoview-- low risk  -pt had been off his plavix this past week due to prep for his back surgery Thrombocytopenia  -Chronic dating back to January 2013 -Monitor for signs of bleeding -Acute drop likely due to infectious process Rheumatoid arthritis  -On weekly methotrexate  -Continue maintenance prednisone  -stress steroid dose  Family Communication:   Son updated at beside--total time 65 min (0815-0920) Disposition Plan:   Home 2-3 days       Procedures/Studies: Ct Head Wo Contrast  07/17/2015  CLINICAL DATA:  pt awoke this morning "not acting right" has been experiencing acute confusion. Pt w/ back surgery x2 days ago, FEVER 102oriented to location, disoriented to situation and time EXAM: CT HEAD WITHOUT CONTRAST TECHNIQUE: Contiguous axial images were obtained from the base of the skull through the vertex without intravenous contrast. COMPARISON:  None. FINDINGS: Severe diffuse age-related atrophy with moderate low attenuation in the deep white matter. No evidence of vascular territory infarct or mass.  No hemorrhage or extra-axial fluid. No hydrocephalus. The calvarium is intact. There is near complete opacification of the right maxillary sinus. Several  bilateral ethmoid air cells are opacified or show inflammatory change particularly on the right. Several mastoid air cells on the right are opacified. IMPRESSION: Age-related involutional change. Significant inflammatory change involving the maxillary sinus, with inflammatory change also involving right mastoid air cells and ethmoid air cells. Electronically Signed   By: Skipper Cliche M.D.   On: 07/17/2015 19:55   Ct Angio Chest Pe W/cm &/or Wo Cm  07/17/2015  CLINICAL DATA:  Back surgery 3 days ago with severe chest pain and shortness of breath. EXAM: CT ANGIOGRAPHY CHEST WITH CONTRAST TECHNIQUE: Multidetector CT imaging of the chest was performed using the standard protocol during bolus administration of intravenous contrast. Multiplanar CT image reconstructions and MIPs were obtained to evaluate the vascular anatomy. CONTRAST:  121mL OMNIPAQUE IOHEXOL 350 MG/ML SOLN COMPARISON:  07/05/2011 FINDINGS: THORACIC INLET/BODY WALL: Bilateral thyroid nodules measuring up to 11 mm, likely stable from 2013 when allowing streak artifact on previous study. No acute finding MEDIASTINUM: Mild cardiomegaly. No pericardial effusion. There is mild to moderate aortic valve calcifications/sclerosis without visible progression since 2013. Diffuse atherosclerosis, including the coronary arteries. No evidence of acute aortic syndrome. CTA of the pulmonary arteries is limited by bolus dispersion and respiratory motion at the bases, primarily affecting subsegmental vessels. There is no visible pulmonary embolism. Patulous esophagus. LUNG WINDOWS: Streaky opacities in the lower lungs with volume loss consistent with atelectasis. Intermittent airway mucus/debris, also seen on the previous study. Interlobular septal thickening at the apices compatible with early edema. Probable  emphysema, with uncertainty related to respiratory motion. UPPER ABDOMEN: Cholelithiasis. OSSEOUS: T10 to lumbar posterior fixation. There are compression fractures of the T5, T7, and T8 vertebral bodies without acute/unhealed fracture line visible. Progressive and advanced adjacent segment disc degeneration above the fusion. Review of the MIP images confirms the above findings. IMPRESSION: 1. No evidence of pulmonary embolism. 2. Bibasilar atelectasis. 3. Early pulmonary edema. 4. Study degraded by respiratory motion. Electronically Signed   By: Monte Fantasia M.D.   On: 07/17/2015 21:37   Dg Chest Port 1 View  07/17/2015  CLINICAL DATA:  Pt w/ back surgery x2 days ago, [facility] report temp of 102.0 on arrival and pt given 1gm APAP, pt also w/ SPO2 of 86% on room air, pt placed on simple mask at 6L/min and SPO2 up to 96%. Pt now under sepsis protocol and is experiencing acute confusion EXAM: PORTABLE CHEST 1 VIEW COMPARISON:  01/15/2015 FINDINGS: Heart is mildly enlarged. There are no focal consolidations or pleural effusions. There is mild left lower lobe subsegmental atelectasis or scarring. Less likely this could represent early infectious infiltrate. No evidence for pulmonary edema. Previous thoracic spine fusion. IMPRESSION: 1. Cardiomegaly without pulmonary edema. 2. Left lower lobe atelectasis versus early infiltrate. Followup is recommended. Electronically Signed   By: Nolon Nations M.D.   On: 07/17/2015 18:06         Subjective: Pt developed worsening sob.  No cp.  Denies f/c, neck pain, headache, n/v/d, abdominal pain.  Has nonproductive cough..  No dysuria or hematuria  Objective: Filed Vitals:   07/17/15 2302 07/18/15 0000 07/18/15 0100 07/18/15 0416  BP: 110/77 102/56 100/80 111/60  Pulse: 103 103 99 102  Temp:    100.3 F (37.9 C)  TempSrc:    Axillary  Resp: 25 22 27  32  Height:      Weight:      SpO2: 95% 86% 94% 97%    Intake/Output Summary (Last 24 hours) at 07/18/15  [id number] Last data  filed at 07/18/15 0600  Gross per 24 hour  Intake 4161.25 ml  Output    875 ml  Net 3286.25 ml   Weight change:  Exam:   General:  Pt is alert, follows commands appropriately, not in acute distress  HEENT: No icterus, No thrush, No neck mass, [place]/AT  Cardiovascular: RRR, S1/S2, no rubs, no gallops  Respiratory: bilateral crackles R>L  Abdomen: Soft/+BS, non tender, non distended, no guarding; no hepatosplenomegaly  Extremities: 2+ LE edema, No lymphangitis, No petechiae, No rashes, no synovitis  Data Reviewed: Basic Metabolic Panel:  Recent Labs Lab 07/17/15 1750 07/18/15 0242  NA 137 136  K 4.1 3.9  CL 106 103  CO2 22 20*  GLUCOSE 74 65  BUN 18 16  CREATININE 1.76* 1.45*  CALCIUM 8.1* 7.6*   Liver Function Tests:  Recent Labs Lab 07/17/15 1750  AST 42*  ALT 18  ALKPHOS 43  BILITOT 1.0  PROT 5.8*  ALBUMIN 2.9*   No results for input(s): LIPASE, AMYLASE in the last 168 hours. No results for input(s): AMMONIA in the last 168 hours. CBC:  Recent Labs Lab 07/17/15 1750 07/18/15 0242  WBC 8.5 9.0  NEUTROABS 5.4  --   HGB 10.1* 9.7*  HCT 31.3* 29.6*  MCV 98.4 99.7  PLT 156 134*   Cardiac Enzymes:  Recent Labs Lab 07/17/15 1750  TROPONINI 0.55*   BNP: Invalid input(s): POCBNP CBG:  Recent Labs Lab 07/18/15 0028 07/18/15 0057 07/18/15 0210 07/18/15 0414 07/18/15 0640  GLUCAP 58* 133* 73 75 71    Recent Results (from the past 240 hour(s))  MRSA PCR Screening     Status: None   Collection Time: 07/17/15 11:06 PM  Result Value Ref Range Status   MRSA by PCR NEGATIVE NEGATIVE Final    Comment:        The GeneXpert MRSA Assay (FDA approved for NASAL specimens only), is one component of a comprehensive MRSA colonization surveillance program. It is not intended to diagnose MRSA infection nor to guide or monitor treatment for MRSA infections.      Scheduled Meds: . sodium chloride   Intravenous STAT  . ampicillin  (OMNIPEN) IV  2 g Intravenous 6 times per day  . aspirin EC  81 mg Oral Daily  . cefTRIAXone (ROCEPHIN)  IV  2 g Intravenous Q12H  . cholecalciferol  1,000 Units Oral Daily  . clopidogrel  75 mg Oral Daily  . enoxaparin (LOVENOX) injection  40 mg Subcutaneous Q24H  . folic acid  1 mg Oral Daily  . insulin aspart  0-9 Units Subcutaneous 6 times per day  . metoprolol succinate  25 mg Oral Daily  . pantoprazole  40 mg Oral Daily  . pravastatin  20 mg Oral Daily  . vancomycin  750 mg Intravenous Q12H   Continuous Infusions: . sodium chloride 50 mL/hr at 07/18/15 0435  . dextrose 20 mL/hr at 07/18/15 0335     Curtis Jim, DO  [facility] Pager [phone]  If 7PM-7AM, please contact night-coverage [email or web address] Password [email or web address] 07/18/2015, 7:11 AM   LOS: 1 day

## 2015-07-18 NOTE — Progress Notes (Signed)
ANTICOAGULATION CONSULT NOTE - Initial Consult  Pharmacy Consult for Heparin Indication: atrial fibrillation  Allergies  Allergen Reactions  . Ace Inhibitors Other (See Comments)    Causes high K  . Angiotensin Receptor Blockers Other (See Comments)    Causes high K  . Metoprolol Other (See Comments)    May cause elevated K level  . Penicillins Swelling and Rash  . Tetracycline Swelling and Rash    Patient Measurements: Height: 5\' 8"  (172.7 cm) Weight: 204 lb 3.2 oz (92.625 kg) IBW/kg (Calculated) : 68.4 Heparin Dosing Weight: 88 kg  Vital Signs: Temp: 99.5 F (37.5 C) (02/05 0726) Temp Source: Oral (02/05 0726) BP: 83/54 mmHg (02/05 0920) Pulse Rate: 147 (02/05 0910)  Labs:  Recent Labs  07/17/15 1750 07/17/15 2349 07/18/15 0242  HGB 10.1*  --  9.7*  HCT 31.3*  --  29.6*  PLT 156  --  134*  APTT  --  34  --   LABPROT  --  15.0  --   INR  --  1.17  --   CREATININE 1.76*  --  1.45*  TROPONINI 0.55*  --   --     Estimated Creatinine Clearance: 48.6 mL/min (by C-G formula based on Cr of 1.45).   Medical History: Past Medical History  Diagnosis Date  . Other and unspecified hyperlipidemia   . Coronary atherosclerosis of unspecified type of vessel, native or graft   . Personal history of colonic polyps   . Actinic keratosis   . Psoriatic arthropathy ([place])   . Other psoriasis   . Scoliosis (and kyphoscoliosis), idiopathic   . GI bleed 12/10    cecam AVM  . Gastritis 12/10  . Hyperlipidemia     takes Pravastatin daily  . Hypertension     takes Metoprolol daily  . Myocardial infarction ([place]) 2010  . Shortness of breath     with exertion  . Pneumonia     hx of-2008  . Headache(784.0)     occasionally  . Joint pain   . Joint swelling   . Rheumatoid arthritis(714.0)     takes Methotrexate 7pills weekly  . Degeneration of intervertebral disc, site unspecified   . Chronic back pain     scoliosis/stenosis/radiculopathy,degenerative disc disease  .  Psoriasis   . Bruises easily   . Esophageal reflux     takes Omeprazole daily  . History of colonic polyps   . Hemorrhoids   . Kidney stone     hx of  . Blood transfusion 2010  . Type II or unspecified type diabetes mellitus without mention of complication, not stated as uncontrolled     type II;controlled by diet and exercise  . PMR (polymyalgia rheumatica) ([facility]) 01/20/2012    tx by specialist on a long prednisone taper  . S/P PTCA (percutaneous transluminal coronary angioplasty)     Medications:  Scheduled:  . sodium chloride   Intravenous STAT  . aspirin EC  81 mg Oral Daily  . ceFEPime (MAXIPIME) IV  2 g Intravenous Q24H  . cholecalciferol  1,000 Units Oral Daily  . clopidogrel  75 mg Oral Daily  . folic acid  1 mg Oral Daily  . insulin aspart  0-9 Units Subcutaneous TID WC  . metoprolol succinate  25 mg Oral Daily  . pantoprazole  40 mg Oral Daily  . pravastatin  20 mg Oral Daily  . vancomycin  750 mg Intravenous Q12H   Infusions:  . sodium chloride 50 mL/hr at 07/18/15  0435  . dextrose 20 mL/hr at 07/18/15 0726  . diltiazem (CARDIZEM) infusion 5 mg/hr (07/18/15 0856)   PRN: acetaminophen **OR** acetaminophen, alum & mag hydroxide-simeth, baclofen, HYDROmorphone (DILAUDID) injection, levalbuterol, nitroGLYCERIN, ondansetron **OR** ondansetron (ZOFRAN) IV, oxyCODONE  Assessment: 74 YOM that presented on 2/4 with AMS.  Patient developed new onset Afib with RVR on 07/18/15.  Pharmacy is consulted to dose heparin for afib.  Lovenox 40 daily (last dose 0800 2/5).  No anticoagulation PTA. CHADSVASc = 5 Hgb 9.7, Plts 134   Goal of Therapy:  Heparin level 0.3-0.7 units/ml Monitor platelets by anticoagulation protocol: Yes   Plan:  Give 4000 units bolus x 1 Start heparin infusion at 1250 units/hr Check anti-Xa level in 8 hours and daily while on heparin Continue to monitor H&H and platelets  Monitor for s/sx of bleeding   Bennye Alm, PharmD Pharmacy  Resident [phone] 07/18/2015,9:36 AM

## 2015-07-18 NOTE — Progress Notes (Signed)
Peripherally Inserted Central Catheter/Midline Placement  The IV Nurse has discussed with the patient and/or persons authorized to consent for the patient, the purpose of this procedure and the potential benefits and risks involved with this procedure.  The benefits include less needle sticks, lab draws from the catheter and patient may be discharged home with the catheter.  Risks include, but not limited to, infection, bleeding, blood clot (thrombus formation), and puncture of an artery; nerve damage and irregular heat beat.  Alternatives to this procedure were also discussed.  Pt signed consent.  Two sons at bedside also giv everbal consent for PICC placement.  PICC/Midline Placement Documentation  PICC Double Lumen 0000000 PICC Right Basilic 42 cm 0 cm (Active)  Indication for Insertion or Continuance of Line Vasoactive infusions;Prolonged intravenous therapies 07/18/2015 12:00 PM  Exposed Catheter (cm) 0 cm 07/18/2015 12:00 PM  Site Assessment Clean;Dry;Intact 07/18/2015 12:00 PM  Lumen #1 Status Flushed;Saline locked;Blood return noted 07/18/2015 12:00 PM  Lumen #2 Status Flushed;Saline locked;Blood return noted 07/18/2015 12:00 PM  Dressing Type Transparent 07/18/2015 12:00 PM  Dressing Status Clean;Dry;Intact;Antimicrobial disc in place 07/18/2015 12:00 PM  Line Care Connections checked and tightened 07/18/2015 12:00 PM  Line Adjustment (NICU/IV Team Only) No 07/18/2015 12:00 PM  Dressing Intervention New dressing 07/18/2015 12:00 PM  Dressing Change Due 07/25/15 07/18/2015 12:00 PM       Curtis Frazier 07/18/2015, 12:28 PM

## 2015-07-18 NOTE — Progress Notes (Signed)
Pt having increased heart rate 130-160, increased work of breathing on 6LNC. Pt slightly confused. Pt up in chair. Paged Dr Tat and called rapid response. EKG done at bedside. Consuelo Pandy RN

## 2015-07-18 NOTE — Consult Note (Signed)
CARDIOLOGY CONSULT NOTE   Patient ID: Curtis Frazier MRN: [id number] DOB/AGE: 06-16-1940 75 y.o.  Admit date: 07/17/2015  Primary Physician   Loura Pardon, MD Primary Cardiologist   Dr. Johnsie Cancel Reason for Consultation   Afib RVR Requesting Physician Dr. Carles Collet  HPI: Curtis Frazier is a 75 y.o. male with a history of Curtis Frazier is a 75 y.o. male with a history of DM, CAD s/p BMS to LCx (2010 at [facility]), GI bleed 2/2 AVM (2014), carotid artery disease, post op PAF, mild AS,PMR on prednisone and HLD who admitted 2/4/17with sepsis.   He has a BMS in his circ from [place] in 02/2009. GI bleed 2014 from AVM. 1/13 had lumbar surgery with PAF in setting of post op anemia and pain. Converted with amiodarone and continues on only beta blocker now. On prednisone taper for PMR. This has improved his hip and knee pain. Allergic to ARB/ACE. On remicade for psoriatic arthritis Trying to taper prednisone and methotrexate .  He had low risk myovue on 05/12/15 for chest pain: LVEF 65%. There is a defect present in the apex location. This is most consistent with very mild apical thinning. Low risk study. He was doing well when seen by Angelena Form, PA-C 05/18/15. Advised to f/u with [facility] as scheduled after echo and carotid doppler summer 2017.   The patient had a elective Lumbar Laminectomy 07/15/15 and discharge on same day. Yesterday morning 07/17/15 the patient developed acute confusion and temp of 102. Patient also complaining of intermittent cough for the past week. EMS was called and found to have hypoxia to 86% and placed on supplement oxygen and brought to emergency room for further evaluation. Workup revealed sepsis with possible left lower pneumonia. Started on antibiotic and IV fluid and admitted for further evaluation. Felt  LLL as possible source of infection . Also evaluated by neurosurgery who did not think that site of surgery as a source of infection. Pending blood culture. This morning around 7:30 AM patient  developed A. fib with RVR and started on IV Cardizem however unable to titrate further due to  Hypotension. Pending PICC line placement. The pharmacy is consulted for heparin placement. The patient also had a respiratory distress this morning.  He is complaining of intermittent left upper and right upper chest pain. He described the pain as a sharp/tight. Worse with a deep breath and movement. The patient's denies orthopnea, PND or syncope. Occasional palpitations.   EKG on admission shows sinus tachycardia with nonspecific ST abnormality inferiorly. EKG this morning shows A. fib at the rate of 141 bpm with ST segment changes in his inferior lead. Troponin 0.55-->1.01. Hemoglobin 9.7. RBC 2.97.Creatinine 1.45.   Past Medical History  Diagnosis Date  . Other and unspecified hyperlipidemia   . Coronary atherosclerosis of unspecified type of vessel, native or graft   . Personal history of colonic polyps   . Actinic keratosis   . Psoriatic arthropathy ([place])   . Other psoriasis   . Scoliosis (and kyphoscoliosis), idiopathic   . GI bleed 12/10    cecam AVM  . Gastritis 12/10  . Hyperlipidemia     takes Pravastatin daily  . Hypertension     takes Metoprolol daily  . Myocardial infarction ([place]) 2010  . Shortness of breath     with exertion  . Pneumonia     hx of-2008  . Headache(784.0)     occasionally  . Joint pain   . Joint swelling   .  Rheumatoid arthritis(714.0)     takes Methotrexate 7pills weekly  . Degeneration of intervertebral disc, site unspecified   . Chronic back pain     scoliosis/stenosis/radiculopathy,degenerative disc disease  . Psoriasis   . Bruises easily   . Esophageal reflux     takes Omeprazole daily  . History of colonic polyps   . Hemorrhoids   . Kidney stone     hx of  . Blood transfusion 2010  . Type II or unspecified type diabetes mellitus without mention of complication, not stated as uncontrolled     type II;controlled by diet and exercise  . PMR  (polymyalgia rheumatica) (HCC) 01/20/2012    tx by specialist on a long prednisone taper  . S/P PTCA (percutaneous transluminal coronary angioplasty)      Past Surgical History  Procedure Laterality Date  . Rotator cuff repair Left 2000  . Colonoscopy    . Vasectomy  1979  . Coronary angioplasty with stent placement  02/2009    has one stent  . Carotid doppler  10/12    0-39% R and 60-79% left   . Cataract extraction  2012  . Cardiac catheterization  2010  . Lithotripsy  2009    Allergies  Allergen Reactions  . Ace Inhibitors Other (See Comments)    Causes high K  . Angiotensin Receptor Blockers Other (See Comments)    Causes high K  . Metoprolol Other (See Comments)    May cause elevated K level  . Penicillins Swelling and Rash  . Tetracycline Swelling and Rash    I have reviewed the patient's current medications . sodium chloride   Intravenous STAT  . aspirin EC  81 mg Oral Daily  . ceFEPime (MAXIPIME) IV  2 g Intravenous Q24H  . cholecalciferol  1,000 Units Oral Daily  . clopidogrel  75 mg Oral Daily  . folic acid  1 mg Oral Daily  . heparin  4,000 Units Intravenous Once  . insulin aspart  0-9 Units Subcutaneous TID WC  . metoprolol succinate  25 mg Oral Daily  . pantoprazole  40 mg Oral Daily  . pravastatin  20 mg Oral Daily  . vancomycin  750 mg Intravenous Q12H   . dextrose 20 mL/hr at 07/18/15 0726  . diltiazem (CARDIZEM) infusion 5 mg/hr (07/18/15 1100)  . heparin     acetaminophen **OR** acetaminophen, alum & mag hydroxide-simeth, baclofen, HYDROmorphone (DILAUDID) injection, levalbuterol, nitroGLYCERIN, ondansetron **OR** ondansetron (ZOFRAN) IV, oxyCODONE  Prior to Admission medications   Medication Sig Start Date End Date Taking? Authorizing Provider  aspirin EC 81 MG tablet Take 81 mg by mouth daily.   Yes Historical Provider, MD  Calcium Carbonate-Vit D-Min (CALCIUM 1200 PO) Take 1 tablet by mouth daily.    Yes Historical Provider, MD    Cholecalciferol 1000 UNITS capsule Take 1,000 Units by mouth daily.     Yes Historical Provider, MD  clopidogrel (PLAVIX) 75 MG tablet TAKE 1 TABLET BY MOUTH EVERY DAY 06/08/15  Yes Abner Greenspan, MD  cyclobenzaprine (FLEXERIL) 10 MG tablet Take 10 mg by mouth 3 (three) times daily as needed for muscle spasms.   Yes Historical Provider, MD  fish oil-omega-3 fatty acids 1000 MG capsule Take 1 g by mouth daily.    Yes Historical Provider, MD  folic acid (FOLVITE) 1 MG tablet Take 1 mg by mouth daily.  05/18/15  Yes Historical Provider, MD  glipiZIDE (GLUCOTROL XL) 5 MG 24 hr tablet TAKE 1 TABLET BY MOUTH EVERY  DAY WITH BREAKFAST 08/24/14  Yes Marne A Tower, MD  InFLIXimab (REMICADE IV) Inject 100 mg into the vein See admin instructions. Takes every 2 months   Yes Historical Provider, MD  methotrexate (RHEUMATREX) 2.5 MG tablet Take by mouth once a week. 4 TABS WEEKLY ./CY   Yes Historical Provider, MD  metoprolol succinate (TOPROL-XL) 25 MG 24 hr tablet TAKE 1 TABLET (25 MG TOTAL) BY MOUTH DAILY. 04/07/15  Yes Abner Greenspan, MD  nitroGLYCERIN (NITROSTAT) 0.4 MG SL tablet Place 1 tablet (0.4 mg total) under the tongue every 5 (five) minutes as needed for chest pain (x 3 doses). 05/19/15  Yes Eileen Stanford, PA-C  omeprazole (PRILOSEC) 20 MG capsule Take 20 mg by mouth daily.  05/18/15  Yes Historical Provider, MD  oxyCODONE-acetaminophen (PERCOCET) 10-325 MG tablet Take 0.5-1 tablets by mouth every 6 (six) hours as needed for pain.  07/15/15  Yes Historical Provider, MD  pravastatin (PRAVACHOL) 20 MG tablet TAKE 1 TABLET BY MOUTH DAILY 04/27/15  Yes Abner Greenspan, MD  predniSONE (DELTASONE) 5 MG tablet Take 5 mg by mouth daily.  04/29/14  Yes Historical Provider, MD  VOLTAREN 1 % GEL Takes 1 application topically daily as needed for pain 05/08/15  Yes Historical Provider, MD     Social History   Social History  . Marital Status: Married    Spouse Name: N/A  . Number of Children: N/A  . Years of  Education: N/A   Occupational History  . Not on file.   Social History Main Topics  . Smoking status: Former Research scientist (life sciences)  . Smokeless tobacco: Never Used     Comment: quit 2010  . Alcohol Use: 0.0 oz/week    0 Standard drinks or equivalent per week     Comment: occasional  . Drug Use: No  . Sexual Activity: No   Other Topics Concern  . Not on file   Social History Narrative    Family Status  Relation Status Death Age  . Mother Deceased   . Father Deceased    Family History  Problem Relation Age of Onset  . Coronary artery disease Mother   . Diabetes Mother   . CAD Mother   . Coronary artery disease Sister   . Hypertension Sister   . Kidney failure Sister   . CAD Sister   . Diabetes Brother   . Prostate cancer Brother   . Pancreatic cancer Brother   . Diabetes Brother   . Anesthesia problems Neg Hx   . Hypotension Neg Hx   . Malignant hyperthermia Neg Hx   . Pseudochol deficiency Neg Hx       ROS:  Full 14 point review of systems complete and found to be negative unless listed above.  Physical Exam: Blood pressure 95/65, pulse 127, temperature 99.5 F (37.5 C), temperature source Oral, resp. rate 28, height 5\' 8"  (1.727 m), weight 204 lb 3.2 oz (92.625 kg), SpO2 98 %.  General: Well developed, well nourished, male in no acute distress Head: Eyes PERRLA, No xanthomas. Normocephalic and atraumatic, oropharynx without edema or exudate.  Lungs: Resp regular and unlabored, Bibasilar rales.  Heart: Ir IR no s3, s4. Soft SEM.   Neck: bilateral carotid bruits. No lymphadenopathy.  JVD. Abdomen: Bowel sounds present, abdomen soft and non-tender without masses or hernias noted. Msk:  No spine or cva tenderness. No weakness, no joint deformities or effusions. Extremities: No clubbing, cyanosis 1+ BL Le edema. DP/PT/Radials 2+ and equal bilaterally. Neuro:  Alert and oriented X 3. No focal deficits noted. Psych:  Good affect, responds appropriately Skin: No rashes or lesions  noted.  Labs:   Lab Results  Component Value Date   WBC 9.0 07/18/2015   HGB 9.7* 07/18/2015   HCT 29.6* 07/18/2015   MCV 99.7 07/18/2015   PLT 134* 07/18/2015    Recent Labs  07/17/15 2349  INR 1.17    Recent Labs Lab 07/17/15 1750 07/18/15 0242  NA 137 136  K 4.1 3.9  CL 106 103  CO2 22 20*  BUN 18 16  CREATININE 1.76* 1.45*  CALCIUM 8.1* 7.6*  PROT 5.8*  --   BILITOT 1.0  --   ALKPHOS 43  --   ALT 18  --   AST 42*  --   GLUCOSE 74 65  ALBUMIN 2.9*  --    MAGNESIUM  Date Value Ref Range Status  07/05/2011 1.5 1.5 - 2.5 mg/dL Final    Recent Labs  07/17/15 1750 07/18/15 0909  TROPONINI 0.55* 1.01*    Lab Results  Component Value Date   CHOL 240* 05/24/2015   HDL 56.80 05/24/2015   LDLCALC 154* 05/24/2015   TRIG 144.0 05/24/2015    Echo: 10/23/2014 LV EF: 60% -  65%  ------------------------------------------------------------------- Indications:   (R01.1).  ------------------------------------------------------------------- History:  PMH: Acquired from the patient and from the patient&'s chart. Murmur. Coronary artery disease. Aortic valve disease. Aortic stenosis. Risk factors: Hypertension. Dyslipidemia.  ------------------------------------------------------------------- Study Conclusions  - Left ventricle: The cavity size was normal. Wall thickness was increased in a pattern of mild LVH. Systolic function was normal. The estimated ejection fraction was in the range of 60% to 65%. Doppler parameters are consistent with abnormal left ventricular relaxation (grade 1 diastolic dysfunction). - Aortic valve: AV is thickened, calcified with mldly restricted motion. Peak and mean gradients through the valve are 32 and 18 mm Hg respectively consistent with mild AS> - Mitral valve: There was mild regurgitation. - Left atrium: The atrium was mildly to moderately dilated.   ECG:  Vent. rate 143 BPM PR interval *  ms QRS duration 90 ms QT/QTc 314/484 ms P-R-T axes * 12 113  Radiology:  Ct Head Wo Contrast  07/17/2015  CLINICAL DATA:  pt awoke this morning "not acting right" has been experiencing acute confusion. Pt w/ back surgery x2 days ago, FEVER 102oriented to location, disoriented to situation and time EXAM: CT HEAD WITHOUT CONTRAST TECHNIQUE: Contiguous axial images were obtained from the base of the skull through the vertex without intravenous contrast. COMPARISON:  None. FINDINGS: Severe diffuse age-related atrophy with moderate low attenuation in the deep white matter. No evidence of vascular territory infarct or mass. No hemorrhage or extra-axial fluid. No hydrocephalus. The calvarium is intact. There is near complete opacification of the right maxillary sinus. Several bilateral ethmoid air cells are opacified or show inflammatory change particularly on the right. Several mastoid air cells on the right are opacified. IMPRESSION: Age-related involutional change. Significant inflammatory change involving the maxillary sinus, with inflammatory change also involving right mastoid air cells and ethmoid air cells. Electronically Signed   By: Skipper Cliche M.D.   On: 07/17/2015 19:55   Ct Angio Chest Pe W/cm &/or Wo Cm  07/17/2015  CLINICAL DATA:  Back surgery 3 days ago with severe chest pain and shortness of breath. EXAM: CT ANGIOGRAPHY CHEST WITH CONTRAST TECHNIQUE: Multidetector CT imaging of the chest was performed using the standard protocol during bolus administration of intravenous contrast. Multiplanar  CT image reconstructions and MIPs were obtained to evaluate the vascular anatomy. CONTRAST:  144mL OMNIPAQUE IOHEXOL 350 MG/ML SOLN COMPARISON:  07/05/2011 FINDINGS: THORACIC INLET/BODY WALL: Bilateral thyroid nodules measuring up to 11 mm, likely stable from 2013 when allowing streak artifact on previous study. No acute finding MEDIASTINUM: Mild cardiomegaly. No pericardial effusion. There is mild to  moderate aortic valve calcifications/sclerosis without visible progression since 2013. Diffuse atherosclerosis, including the coronary arteries. No evidence of acute aortic syndrome. CTA of the pulmonary arteries is limited by bolus dispersion and respiratory motion at the bases, primarily affecting subsegmental vessels. There is no visible pulmonary embolism. Patulous esophagus. LUNG WINDOWS: Streaky opacities in the lower lungs with volume loss consistent with atelectasis. Intermittent airway mucus/debris, also seen on the previous study. Interlobular septal thickening at the apices compatible with early edema. Probable emphysema, with uncertainty related to respiratory motion. UPPER ABDOMEN: Cholelithiasis. OSSEOUS: T10 to lumbar posterior fixation. There are compression fractures of the T5, T7, and T8 vertebral bodies without acute/unhealed fracture line visible. Progressive and advanced adjacent segment disc degeneration above the fusion. Review of the MIP images confirms the above findings. IMPRESSION: 1. No evidence of pulmonary embolism. 2. Bibasilar atelectasis. 3. Early pulmonary edema. 4. Study degraded by respiratory motion. Electronically Signed   By: Monte Fantasia M.D.   On: 07/17/2015 21:37   Dg Chest Port 1 View  07/18/2015  CLINICAL DATA:  Shortness of breath. EXAM: PORTABLE CHEST 1 VIEW COMPARISON:  July 17, 2015. FINDINGS: Stable cardiomegaly. No pneumothorax or pleural effusion is noted. Stable mild bibasilar subsegmental atelectasis is noted. Bony thorax is unremarkable. IMPRESSION: Stable mild bibasilar subsegmental atelectasis. Electronically Signed   By: Marijo Conception, M.D.   On: 07/18/2015 09:06   Dg Chest Port 1 View  07/17/2015  CLINICAL DATA:  Pt w/ back surgery x2 days ago, [facility] report temp of 102.0 on arrival and pt given 1gm APAP, pt also w/ SPO2 of 86% on room air, pt placed on simple mask at 6L/min and SPO2 up to 96%. Pt now under sepsis protocol and is experiencing acute  confusion EXAM: PORTABLE CHEST 1 VIEW COMPARISON:  01/15/2015 FINDINGS: Heart is mildly enlarged. There are no focal consolidations or pleural effusions. There is mild left lower lobe subsegmental atelectasis or scarring. Less likely this could represent early infectious infiltrate. No evidence for pulmonary edema. Previous thoracic spine fusion. IMPRESSION: 1. Cardiomegaly without pulmonary edema. 2. Left lower lobe atelectasis versus early infiltrate. Followup is recommended. Electronically Signed   By: Nolon Nations M.D.   On: 07/17/2015 18:06    ASSESSMENT AND PLAN:   1. A. fib with RVR  - New onset. Likely due to infection. CHADSVASCs score of 5 (age, hypertension, vascular disease and diabetes) - Started on IV Cardizem, rate still elevated at 130 to 140s. Unable to titrate for the due to hypotension. Pending PICC line placement.  - Will need neurosurgery clearance prior to starting any anticoagulation given his recent surgery. Risk for bleeding complication .  - 06/2011 had lumbar surgery with PAF in setting of post op anemia and pain. Converted with amiodarone and continues on only beta blocker now  2. Elevated troponin/chest pain - Intermittent chest pain. Likely atypical.  - He had low risk myovue on 05/12/15 for chest pain: LVEF 65%. There is a defect present in the apex location. This is most consistent with very mild apical thinning. Low risk study. - Continue cycle troponin. Continue aspirin/Plavix/beta blockers/statin. Likely need cath some point given  ST segment change in lateral leads, could be rate related.  - Will change Toprol XL to lopressor 12.5mg  BID.   3. Mild AS - Echocardiogram 10/23/14 shows left ventricular function of 60-65%, mild LVH, grade 1 diastolic dysfunction, mild AS (heavily calcified & Peak and mean gradients through the valve are 32 and 18 mm Hg respectively ) MR mild mitral regurgitation and mild to moderate left atrium.   4. CAD s/p distant BMS to LAD  2010 [facility] - Recent low risk Myoview.  5.  Carotid artery disease  - Carotid Doppler [id number] shows LICA [id number] . Follow-up with Dr. Johnsie Cancel as scheduled 10/2015   6. Mild bilateral LE edema - With faint rales. Get repeat echocardiogram to further evaluate left ventricular function. Apply compression stocking. Will stabilized BP with IV fluid first.  - GET BNP  7. Hypotension - Give Iv fluid at rate of 75 ml/hr. This will allows to further titration of medications.   Otherwise per primary Principal Problem:   Sepsis ([place]) Active Problems:   Essential hypertension, benign   Coronary atherosclerosis   Rheumatoid arthritis ([facility])   Acute encephalopathy   LLL pneumonia   Type II or unspecified type diabetes mellitus without mention of complication, not stated as uncontrolled   S/P lumbar laminectomy   Elevated troponin   Acute respiratory failure with hypoxia ([facility])   Atrial fibrillation with RVR ([place])   Acute pulmonary edema ([place])   Signed: Bhagat,Bhavinkumar, PA 07/18/2015, 11:25 AM Pager [phone]  Co-Sign MD   Patient seen and examined. Agree with assessment and plan.  Mr. Hurchel Walkes is a 75 year old Caucasian male who has established coronary artery disease and underwent bare-metal stenting to his left circumflex coronary artery in 2010 at [facility].  The patient has a history of carotid artery disease, PAF, mild aortic stenosis, type 2 diabetes mellitus, hyperlipidemia, as well as polymyalgia rheumatica.  In 2013, the patient developed atrial fibrillation following lumbar surgery in the setting of postoperative anemia.  At that time, he converted to sinus rhythm with amiodarone and since has only been on beta blocker therapy.  His last nuclear perfusion study was in November 2016 was nonischemic.  On 07/15/2015.  He underwent outpatient elective lumbar laminectomy and was admitted yesterday with fever and possible sepsis with possible left lower lobe infiltrate.  This morning, the patient  developed atrial fibrillation with rapid ventricular response and in the setting of his atrial fibrillation at a rate in the 140s, he developed ischemic ST segment depression of at least 2 mm anterolaterally and laterally.  He has been on IV Cardizem at 5 mg per hour.  His blood pressure has been somewhat low in the 123456 range systolic.  He denies any chest pressure.  He denies significant shortness of breath.  On telemetry presently, his ventricular rate is ranging between 120 and 135 beats per minute in atrial fibrillation.  H&T was unremarkable.  He had a thick neck.  Mallinpati scale is a least 3.  He had decreased breath sounds without wheezing.  Rhythm was irregularly irregular with a 1 to 2/6 systolic murmur in the aortic region.  There is no AR.  Abdomen was nontender without hepatosplenomegaly.  No evidence for infection at his lumbar laminectomy site.  Trace bilateral edema.  Neurologic exam was grossly nonfocal.  Psychologically normal affect and mood.  Operatory is notable for potassium of 3.9.  BUN 16, creatinine 1.45 with stage III chronic kidney disease with a GFR 46.  His troponin is mildly  increased at 1.01 which may be reflective of demand ischemia versus non-STEMI in light of his ST-T changes.  He is anemic with a hemoglobin of 9.7 and hematocrit of 29.6.  U/A is negative. A CXR  mild bibasilar subsegmental atelectasis.CT imaging was negative for PE and showed bibasilar atelectasis. His echo Doppler study from May 2016 revealed normal systolic function with an EF of 60-65% with grade 1 diastolic dysfunction, and mild aortic valve stenosis with a mean gradient of 18 and peak gradient of 32 with AVA estimated at 1.45 cm.  There was mild MR and mild to moderate LA dilatation.  His echo Doppler study from his present admission has not yet been done.  At present, I would initiategentle hydration which will allow for improved blood pressure control, such that we can then further increases IV Cardizem  to 10 mg/hr for improved rate control of his atrial fibrillation. Check BNP.  I would defer initiating heparin since the atrial fibrillation is of short duration until clearance is given neurosurgically.  If the patient is still in atrial fibrillation by tomorrow morning I would institute heparin if he is neurosurgically stable. I will initiate beta blocker therapy and titrate as his blood pressure allows.  I would hold off initiating NOAC or warfarin therapy since I suspect he will need a repeat cardiac catheterization in light of his ischemic ST segment depression and probable rate initiated non-ST segment elevation MI.   Troy Sine, MD, [facility] 07/18/2015 1:06 PM

## 2015-07-18 NOTE — Progress Notes (Signed)
Utilization review completed.  

## 2015-07-18 NOTE — Evaluation (Signed)
Clinical/Bedside Swallow Evaluation Patient Details  Name: Curtis Frazier MRN: [id number] Date of Birth: Nov 01, 1940  Today's Date: 07/18/2015 Time: SLP Start Time (ACUTE ONLY): [id number] SLP Stop Time (ACUTE ONLY): 0911 SLP Time Calculation (min) (ACUTE ONLY): 13 min  Past Medical History:  Past Medical History  Diagnosis Date  . Other and unspecified hyperlipidemia   . Coronary atherosclerosis of unspecified type of vessel, native or graft   . Personal history of colonic polyps   . Actinic keratosis   . Psoriatic arthropathy ([facility])   . Other psoriasis   . Scoliosis (and kyphoscoliosis), idiopathic   . GI bleed 12/10    cecam AVM  . Gastritis 12/10  . Hyperlipidemia     takes Pravastatin daily  . Hypertension     takes Metoprolol daily  . Myocardial infarction ([place]) 2010  . Shortness of breath     with exertion  . Pneumonia     hx of-2008  . Headache(784.0)     occasionally  . Joint pain   . Joint swelling   . Rheumatoid arthritis(714.0)     takes Methotrexate 7pills weekly  . Degeneration of intervertebral disc, site unspecified   . Chronic back pain     scoliosis/stenosis/radiculopathy,degenerative disc disease  . Psoriasis   . Bruises easily   . Esophageal reflux     takes Omeprazole daily  . History of colonic polyps   . Hemorrhoids   . Kidney stone     hx of  . Blood transfusion 2010  . Type II or unspecified type diabetes mellitus without mention of complication, not stated as uncontrolled     type II;controlled by diet and exercise  . PMR (polymyalgia rheumatica) ([facility]) 01/20/2012    tx by specialist on a long prednisone taper  . S/P PTCA (percutaneous transluminal coronary angioplasty)    Past Surgical History:  Past Surgical History  Procedure Laterality Date  . Rotator cuff repair Left 2000  . Colonoscopy    . Vasectomy  1979  . Coronary angioplasty with stent placement  02/2009    has one stent  . Carotid doppler  10/12    0-39% R and 60-79% left   .  Cataract extraction  2012  . Cardiac catheterization  2010  . Lithotripsy  2009   HPI:  Curtis Frazier is a 75 y.o. male with recent Lumbar Laminectomy 2 days ago, history of CAD, HTN, DM2, Hyperlipidemia, and RA who presents to the ED with complaints of confusion since awakening this AM, and Fever to 102. His family reports that he was not acting right. He has had non-productive cough, and SOB, and was found by EMS to have hypoxia to 86% and was placed on NCO2 and improved to 96%. He was also given tylenol for his fever by EMS on route. In the ED a Sepsis workup was initiatied, and he was placed on empiric antibiotics for possible CNS infection due to his recent surgery. HIs Chest X-ray however revealed possible early LLL pneumonia. He was referred for admission. Neurosurgery on call was consulted by the EDP .    Assessment / Plan / Recommendation Clinical Impression  Clinical swallowing evaluation was completed.  The patient was seen sitting upright in his bedside chair with his breakfast tray.  A family member was present.  Both report good appetite and no obvious swallowing issues.  Of note, the patient is very short of breath.  Oral motor exam was deferred.  The patient presented with a functional  oral and pharyngeal swallow.  Swallow trigger was timely and hyo-laryngeal excursion was judged to be adequate.  Overt s/s of aspiration were not observed.  Recommend continue with current diet.  Consider MBS if respiratory status is not clearing as expected.  ST will follow.      Aspiration Risk    Mild   Diet Recommendation   Regular diet with thin liquids.    Medication Administration: Whole meds with liquid    Other  Recommendations Oral Care Recommendations: Oral care BID   Follow up Recommendations   (To be determined.  )    Frequency and Duration min 2x/week  2 weeks       Prognosis Prognosis for Safe Diet Advancement: Good      Swallow Study   General Date of Onset:  07/17/15 HPI: Curtis Frazier is a 75 y.o. male with recent Lumbar Laminectomy 2 days ago, history of CAD, HTN, DM2, Hyperlipidemia, and RA who presents to the ED with complaints of confusion since awakening this AM, and Fever to 102. His family reports that he was not acting right. He has had non-productive cough, and SOB, and was found by EMS to have hypoxia to 86% and was placed on NCO2 and improved to 96%. He was also given tylenol for his fever by EMS on route. In the ED a Sepsis workup was initiatied, and he was placed on empiric antibiotics for possible CNS infection due to his recent surgery. HIs Chest X-ray however revealed possible early LLL pneumonia. He was referred for admission. Neurosurgery on call was consulted by the EDP .  Type of Study: Bedside Swallow Evaluation Previous Swallow Assessment: None noted.   Diet Prior to this Study: Regular;Thin liquids Temperature Spikes Noted: Yes Respiratory Status: Nasal cannula (Just taken off venti mask.  ) History of Recent Intubation: No Behavior/Cognition: Alert;Cooperative;Impulsive Oral Care Completed by SLP: No Vision: Functional for self-feeding Self-Feeding Abilities: Able to feed self Patient Positioning: Upright in chair Baseline Vocal Quality: Normal    Oral/Motor/Sensory Function Overall Oral Motor/Sensory Function:  (Not assessed, however overt issues were not seen.  )   Ice Chips Ice chips: Not tested   Thin Liquid Thin Liquid: Within functional limits Presentation: Cup;Straw;Self Fed    Nectar Thick Nectar Thick Liquid: Not tested   Honey Thick Honey Thick Liquid: Not tested   Puree Puree: Not tested   Solid   GO   Solid: Within functional limits Presentation: Self Fed;Spoon    Functional Assessment Tool Used: ASHA NOMS and clinical judgment.   Functional Limitations: Swallowing Swallow Current Status [phone]): At least 1 percent but less than 20 percent impaired, limited or restricted Swallow Goal  Status [phone]): At least 1 percent but less than 20 percent impaired, limited or restricted  Shelly Flatten, [place] SLP [phone] Shelly Flatten N 07/18/2015,9:23 AM

## 2015-07-19 ENCOUNTER — Inpatient Hospital Stay (HOSPITAL_COMMUNITY): Payer: Medicare Other

## 2015-07-19 DIAGNOSIS — I25118 Atherosclerotic heart disease of native coronary artery with other forms of angina pectoris: Secondary | ICD-10-CM

## 2015-07-19 DIAGNOSIS — A419 Sepsis, unspecified organism: Principal | ICD-10-CM

## 2015-07-19 DIAGNOSIS — J9601 Acute respiratory failure with hypoxia: Secondary | ICD-10-CM

## 2015-07-19 LAB — GLUCOSE, CAPILLARY
GLUCOSE-CAPILLARY: 142 mg/dL — AB (ref 65–99)
Glucose-Capillary: 192 mg/dL — ABNORMAL HIGH (ref 65–99)
Glucose-Capillary: 144 mg/dL — ABNORMAL HIGH (ref 65–99)
Glucose-Capillary: 134 mg/dL — ABNORMAL HIGH (ref 65–99)

## 2015-07-19 LAB — BASIC METABOLIC PANEL
ANION GAP: 11 (ref 5–15)
BUN: 20 mg/dL (ref 6–20)
CALCIUM: 7.1 mg/dL — AB (ref 8.9–10.3)
CO2: 20 mmol/L — ABNORMAL LOW (ref 22–32)
CREATININE: 1.92 mg/dL — AB (ref 0.61–1.24)
Chloride: 102 mmol/L (ref 101–111)
GFR calc Af Amer: 38 mL/min — ABNORMAL LOW (ref >60)
GFR, EST NON AFRICAN AMERICAN: 32 mL/min — AB (ref >60)
GLUCOSE: 242 mg/dL — AB (ref 65–99)
Potassium: 4.5 mmol/L (ref 3.5–5.1)
Sodium: 133 mmol/L — ABNORMAL LOW (ref 135–145)

## 2015-07-19 LAB — BRAIN NATRIURETIC PEPTIDE: B NATRIURETIC PEPTIDE 5: 1734.1 pg/mL — AB (ref 0.0–100.0)

## 2015-07-19 LAB — BLOOD GAS, ARTERIAL
ACID-BASE DEFICIT: 6.8 mmol/L — AB (ref 0.0–2.0)
BICARBONATE: 17.7 meq/L — AB (ref 20.0–24.0)
DELIVERY SYSTEMS: POSITIVE
DRAWN BY: 236041
Expiratory PAP: 8
FIO2: 1
INSPIRATORY PAP: 14
O2 Saturation: 96.2 %
PO2 ART: 88.7 mmHg (ref 80.0–100.0)
Patient temperature: 98.6
TCO2: 18.7 mmol/L (ref 0–100)
pCO2 arterial: 32.9 mmHg — ABNORMAL LOW (ref 35.0–45.0)
pH, Arterial: 7.35 (ref 7.350–7.450)

## 2015-07-19 LAB — CBC
HCT: 25 % — ABNORMAL LOW (ref 39.0–52.0)
Hemoglobin: 8.5 g/dL — ABNORMAL LOW (ref 13.0–17.0)
MCH: 33.1 pg (ref 26.0–34.0)
MCHC: 34 g/dL (ref 30.0–36.0)
MCV: 97.3 fL (ref 78.0–100.0)
PLATELETS: 134 10*3/uL — AB (ref 150–400)
RBC: 2.57 MIL/uL — ABNORMAL LOW (ref 4.22–5.81)
RDW: 16.3 % — AB (ref 11.5–15.5)
WBC: 8.6 10*3/uL (ref 4.0–10.5)

## 2015-07-19 LAB — URINE CULTURE

## 2015-07-19 LAB — HEMOGLOBIN A1C
HEMOGLOBIN A1C: 7.4 % — AB (ref 4.8–5.6)
Mean Plasma Glucose: 166 mg/dL

## 2015-07-19 LAB — PROCALCITONIN: Procalcitonin: 3.8 ng/mL

## 2015-07-19 MED ORDER — HALOPERIDOL LACTATE 5 MG/ML IJ SOLN
5.0000 mg | Freq: Once | INTRAMUSCULAR | Status: AC
  Administered 2015-07-19: 5 mg via INTRAVENOUS

## 2015-07-19 MED ORDER — HYDROMORPHONE HCL 1 MG/ML IJ SOLN
0.5000 mg | INTRAMUSCULAR | Status: DC | PRN
  Administered 2015-07-21: 0.5 mg via INTRAVENOUS
  Filled 2015-07-19: qty 1

## 2015-07-19 MED ORDER — LORAZEPAM 2 MG/ML IJ SOLN
0.5000 mg | INTRAMUSCULAR | Status: DC | PRN
  Administered 2015-07-21: 0.5 mg via INTRAVENOUS
  Filled 2015-07-19: qty 1

## 2015-07-19 MED ORDER — CHLORHEXIDINE GLUCONATE 0.12 % MT SOLN
15.0000 mL | Freq: Two times a day (BID) | OROMUCOSAL | Status: DC
  Administered 2015-07-19 – 2015-07-20 (×3): 15 mL via OROMUCOSAL
  Filled 2015-07-19 (×3): qty 15

## 2015-07-19 MED ORDER — BUDESONIDE 0.5 MG/2ML IN SUSP
0.5000 mg | Freq: Two times a day (BID) | RESPIRATORY_TRACT | Status: DC
  Administered 2015-07-19 – 2015-07-21 (×5): 0.5 mg via RESPIRATORY_TRACT
  Filled 2015-07-19 (×7): qty 2

## 2015-07-19 MED ORDER — ACETAMINOPHEN 650 MG RE SUPP
325.0000 mg | Freq: Once | RECTAL | Status: AC
  Administered 2015-07-19: 325 mg via RECTAL
  Filled 2015-07-19: qty 1

## 2015-07-19 MED ORDER — LORAZEPAM 2 MG/ML IJ SOLN
0.5000 mg | Freq: Four times a day (QID) | INTRAMUSCULAR | Status: DC | PRN
  Administered 2015-07-19: 0.5 mg via INTRAVENOUS
  Filled 2015-07-19: qty 1

## 2015-07-19 MED ORDER — CETYLPYRIDINIUM CHLORIDE 0.05 % MT LIQD
7.0000 mL | Freq: Two times a day (BID) | OROMUCOSAL | Status: DC
  Administered 2015-07-20 (×2): 7 mL via OROMUCOSAL

## 2015-07-19 MED ORDER — FUROSEMIDE 10 MG/ML IJ SOLN
40.0000 mg | Freq: Every day | INTRAMUSCULAR | Status: DC
  Administered 2015-07-19 – 2015-07-20 (×2): 40 mg via INTRAVENOUS
  Filled 2015-07-19 (×3): qty 4

## 2015-07-19 MED ORDER — LORAZEPAM 2 MG/ML IJ SOLN
1.0000 mg | INTRAMUSCULAR | Status: DC | PRN
  Administered 2015-07-19: 1 mg via INTRAVENOUS
  Filled 2015-07-19: qty 1

## 2015-07-19 MED ORDER — HALOPERIDOL LACTATE 5 MG/ML IJ SOLN
INTRAMUSCULAR | Status: AC
  Filled 2015-07-19: qty 1

## 2015-07-19 NOTE — Progress Notes (Signed)
PROGRESS NOTE  Curtis Frazier [id number] DOB: 01-04-1941 DOA: 07/17/2015 PCP: Loura Pardon, MD Brief History 75 year old male with a history of coronary artery disease status post stent placement, hypertension, hyperlipidemia, diabetes mellitus, rheumatoid arthritis presented with confusion as reported by his family. The patient also had a fever 102.68F with persistent nonproductive cough and shortness of breath. He was noted to be hypoxic by EMS with oxygen saturation of 86% and had 102.0 fever per EMS. This improved with supplement oxygen. Workup in the emergency department revealed serum creatinine 1.76, the WBC 8.5, lactic acid 1.4, and a chest x-ray that suggested left lower lobe atelectasis versus infiltrate. Notably, the patient recently had lumbar laminectomy that was performed 2 days prior to this admission. Neurosurgery was consulted from the emergency department due to possible concerns of postoperative infection. EKG shows sinus tachycardia with nonspecific ST-T wave changes. CT antrum of the chest was negative for embolus and showed bibasilar atelectasis. The patient was started on gemcitabine about intravenous fluids for presumptive pneumonia. On the morning of 07/18/15, the patient developed respiratory distress and oxygen desaturation Assessment/Plan: Sepsis -Presented at the time of admission -Continue intravenous antibiotics pending culture data -Continue intravenous fluids  -Lactic acid 1.4  -Procalcitonin 1.36  -Influenza PCR negative  Acute respiratory failure with hypoxia -Presently stable on his cannula -secondary to pulmonary edema and ?pneumonia/aspiration -03/15/2016 CT intermittent chest negative for pulmonary embolus -07/18/15 ABG 7.403/30/55/19 (0.4) although on 40% FiO2 only approx 5 min Pulmonary edema -pt received 30cc/kg fluid for sepsis and additional fluid for sepsis evening of 07/17/15 and fluids restarted on 07/18/15 -saline lock IVF -07/19/15 CXR with  increased vascular congestion when compared to 2/5 -restart lasix IV -Echo--pending -daily weights Afib with RVR -new onset am 07/18/15 -continue diltiazem drip-->rate controlled -Neurosurgery input appreciated-->heparin ok -consult cardiology -CHADSVASc = 5 -TSH--0.678 -07/18/15 EKG--afib HR 141, ST depression V3-V6 -07/17/15 troponin 0.55-->continue to cycle Elevated troponin -likely demand ischemia -defer to cardiology Acute encephalopathy  -multifactorial Secondary to infectious process, respiratory failure, opioids and new onset afib -07/17/2015 CT brain negative for acute process  -Urinalysis negative for pyuria  Acute on chronic renal failure (CKD3) -secondary to sepsis -baseline creatinine 1.2-1.4 Status post lumbar laminectomy  -Discussed with Dr. Kathyrn Sheriff (Neurosurgery) -doubt CNS infection Diabetes mellitus type 2 -NovoLog sliding scale -05/24/2015 hemoglobin A1c 7.6  Hypertension -Hold antihypertensive medications due to soft blood pressure Coronary artery disease  -Continue aspirin and plavix -05/12/2015 Myoview-- low risk  -pt had been off his plavix this past week due to prep for his back surgery Thrombocytopenia  -Chronic dating back to January 2013 -Monitor for signs of bleeding -Acute drop likely due to infectious process Rheumatoid arthritis  -On weekly methotrexate  -Continue maintenance prednisone  -stress steroid dose  Family Communication: Daughter and son updated at beside Disposition Plan: Home >3 days   Procedures/Studies: Ct Head Wo Contrast  07/17/2015  CLINICAL DATA:  pt awoke this morning "not acting right" has been experiencing acute confusion. Pt w/ back surgery x2 days ago, FEVER 102oriented to location, disoriented to situation and time EXAM: CT HEAD WITHOUT CONTRAST TECHNIQUE: Contiguous axial images were obtained from the base of the skull through the vertex without intravenous contrast. COMPARISON:  None. FINDINGS: Severe  diffuse age-related atrophy with moderate low attenuation in the deep white matter. No evidence of vascular territory infarct or mass. No hemorrhage or extra-axial fluid. No hydrocephalus. The calvarium is intact. There is near complete opacification  of the right maxillary sinus. Several bilateral ethmoid air cells are opacified or show inflammatory change particularly on the right. Several mastoid air cells on the right are opacified. IMPRESSION: Age-related involutional change. Significant inflammatory change involving the maxillary sinus, with inflammatory change also involving right mastoid air cells and ethmoid air cells. Electronically Signed   By: Skipper Cliche M.D.   On: 07/17/2015 19:55   Ct Angio Chest Pe W/cm &/or Wo Cm  07/17/2015  CLINICAL DATA:  Back surgery 3 days ago with severe chest pain and shortness of breath. EXAM: CT ANGIOGRAPHY CHEST WITH CONTRAST TECHNIQUE: Multidetector CT imaging of the chest was performed using the standard protocol during bolus administration of intravenous contrast. Multiplanar CT image reconstructions and MIPs were obtained to evaluate the vascular anatomy. CONTRAST:  135mL OMNIPAQUE IOHEXOL 350 MG/ML SOLN COMPARISON:  07/05/2011 FINDINGS: THORACIC INLET/BODY WALL: Bilateral thyroid nodules measuring up to 11 mm, likely stable from 2013 when allowing streak artifact on previous study. No acute finding MEDIASTINUM: Mild cardiomegaly. No pericardial effusion. There is mild to moderate aortic valve calcifications/sclerosis without visible progression since 2013. Diffuse atherosclerosis, including the coronary arteries. No evidence of acute aortic syndrome. CTA of the pulmonary arteries is limited by bolus dispersion and respiratory motion at the bases, primarily affecting subsegmental vessels. There is no visible pulmonary embolism. Patulous esophagus. LUNG WINDOWS: Streaky opacities in the lower lungs with volume loss consistent with atelectasis. Intermittent airway  mucus/debris, also seen on the previous study. Interlobular septal thickening at the apices compatible with early edema. Probable emphysema, with uncertainty related to respiratory motion. UPPER ABDOMEN: Cholelithiasis. OSSEOUS: T10 to lumbar posterior fixation. There are compression fractures of the T5, T7, and T8 vertebral bodies without acute/unhealed fracture line visible. Progressive and advanced adjacent segment disc degeneration above the fusion. Review of the MIP images confirms the above findings. IMPRESSION: 1. No evidence of pulmonary embolism. 2. Bibasilar atelectasis. 3. Early pulmonary edema. 4. Study degraded by respiratory motion. Electronically Signed   By: Monte Fantasia M.D.   On: 07/17/2015 21:37   Dg Chest Port 1 View  07/19/2015  CLINICAL DATA:  Respiratory distress EXAM: PORTABLE CHEST 1 VIEW COMPARISON:  07/18/2015 FINDINGS: Cardiomegaly with vascular congestion and diffuse interstitial prominence compatible with edema. Right PICC line is in place with the tip at the cavoatrial junction. Possible layering left effusion. No acute bony abnormality. IMPRESSION: Findings compatible with CHF.  Possible small left effusion. Electronically Signed   By: Rolm Baptise M.D.   On: 07/19/2015 13:40   Dg Chest Port 1 View  07/18/2015  CLINICAL DATA:  Line placement EXAM: PORTABLE CHEST 1 VIEW COMPARISON:  07/18/2015 FINDINGS: Right-sided PICC line has been placed, tip overlying the level of the superior vena cava. The heart is enlarged. There is pulmonary vascular congestion. Opacity at the medial left lung base is increased, consistent with developing infiltrate and/or atelectasis. IMPRESSION: 1. Right-sided PICC line tip to the superior vena cava. 2. Developing left lower lobe infiltrate and/or atelectasis. Electronically Signed   By: Nolon Nations M.D.   On: 07/18/2015 14:04   Dg Chest Port 1 View  07/18/2015  CLINICAL DATA:  Shortness of breath. EXAM: PORTABLE CHEST 1 VIEW COMPARISON:   July 17, 2015. FINDINGS: Stable cardiomegaly. No pneumothorax or pleural effusion is noted. Stable mild bibasilar subsegmental atelectasis is noted. Bony thorax is unremarkable. IMPRESSION: Stable mild bibasilar subsegmental atelectasis. Electronically Signed   By: Marijo Conception, M.D.   On: 07/18/2015 09:06   Dg Chest  Port 1 View  07/17/2015  CLINICAL DATA:  Pt w/ back surgery x2 days ago, [facility] report temp of 102.0 on arrival and pt given 1gm APAP, pt also w/ SPO2 of 86% on room air, pt placed on simple mask at 6L/min and SPO2 up to 96%. Pt now under sepsis protocol and is experiencing acute confusion EXAM: PORTABLE CHEST 1 VIEW COMPARISON:  01/15/2015 FINDINGS: Heart is mildly enlarged. There are no focal consolidations or pleural effusions. There is mild left lower lobe subsegmental atelectasis or scarring. Less likely this could represent early infectious infiltrate. No evidence for pulmonary edema. Previous thoracic spine fusion. IMPRESSION: 1. Cardiomegaly without pulmonary edema. 2. Left lower lobe atelectasis versus early infiltrate. Followup is recommended. Electronically Signed   By: Nolon Nations M.D.   On: 07/17/2015 18:06         Subjective: Patient was confused. He remains on BiPAP. Rapidly desaturates off BiPAP. Denies any chest pain, headache, neck pain, abdominal pain.  Objective: Filed Vitals:   07/19/15 1100 07/19/15 1200 07/19/15 1243 07/19/15 1300  BP: 133/116 105/59  109/64  Pulse: 140 84 96 98  Temp:  98.5 F (36.9 C)    TempSrc:  Axillary    Resp: 33 19 37 28  Height:      Weight:      SpO2: 96% 94% 97% 95%    Intake/Output Summary (Last 24 hours) at 07/19/15 1409 Last data filed at 07/19/15 0400  Gross per 24 hour  Intake 2726.25 ml  Output    450 ml  Net 2276.25 ml   Weight change: 9.385 kg (20 lb 11 oz) Exam:   General:  Pt is alert, follows commands appropriately, not in acute distress  HEENT: No icterus, No thrush, No neck mass,  [place]/AT  Cardiovascular: IRRR, S1/S2, no rubs, no gallops  Respiratory: Bilateral crackles. No wheezes. Good air movement  Abdomen: Soft/+BS, non tender, non distended, no guarding; no hepatosplenomegaly  Extremities: 2+ LE edema, No lymphangitis, No petechiae, No rashes, no synovitis; no cyanosis  Data Reviewed: Basic Metabolic Panel:  Recent Labs Lab 07/17/15 1750 07/18/15 0242 07/19/15 0230  NA 137 136 133*  K 4.1 3.9 4.5  CL 106 103 102  CO2 22 20* 20*  GLUCOSE 74 65 242*  BUN 18 16 20   CREATININE 1.76* 1.45* 1.92*  CALCIUM 8.1* 7.6* 7.1*   Liver Function Tests:  Recent Labs Lab 07/17/15 1750  AST 42*  ALT 18  ALKPHOS 43  BILITOT 1.0  PROT 5.8*  ALBUMIN 2.9*   No results for input(s): LIPASE, AMYLASE in the last 168 hours. No results for input(s): AMMONIA in the last 168 hours. CBC:  Recent Labs Lab 07/17/15 1750 07/18/15 0242 07/19/15 0230  WBC 8.5 9.0 8.6  NEUTROABS 5.4  --   --   HGB 10.1* 9.7* 8.5*  HCT 31.3* 29.6* 25.0*  MCV 98.4 99.7 97.3  PLT 156 134* 134*   Cardiac Enzymes:  Recent Labs Lab 07/17/15 1750 07/18/15 0909 07/18/15 1334  TROPONINI 0.55* 1.01* 2.06*   BNP: Invalid input(s): POCBNP CBG:  Recent Labs Lab 07/18/15 1114 07/18/15 1559 07/18/15 2303 07/19/15 0834 07/19/15 1157  GLUCAP 140* 169* 174* 192* 144*    Recent Results (from the past 240 hour(s))  Culture, blood (routine x 2)     Status: None (Preliminary result)   Collection Time: 07/17/15  5:50 PM  Result Value Ref Range Status   Specimen Description BLOOD LEFT HAND  Final   Special Requests BOTTLES  DRAWN AEROBIC AND ANAEROBIC 5CC  Final   Culture NO GROWTH < 24 HOURS  Final   Report Status PENDING  Incomplete  Urine culture     Status: None   Collection Time: 07/17/15  6:11 PM  Result Value Ref Range Status   Specimen Description URINE, CLEAN CATCH  Final   Special Requests NONE  Final   Culture MULTIPLE SPECIES PRESENT, SUGGEST RECOLLECTION  Final    Report Status 07/19/2015 FINAL  Final  Culture, blood (routine x 2)     Status: None (Preliminary result)   Collection Time: 07/17/15  6:15 PM  Result Value Ref Range Status   Specimen Description BLOOD RIGHT HAND  Final   Special Requests BOTTLES DRAWN AEROBIC AND ANAEROBIC 5CC  Final   Culture NO GROWTH < 24 HOURS  Final   Report Status PENDING  Incomplete  MRSA PCR Screening     Status: None   Collection Time: 07/17/15 11:06 PM  Result Value Ref Range Status   MRSA by PCR NEGATIVE NEGATIVE Final    Comment:        The GeneXpert MRSA Assay (FDA approved for NASAL specimens only), is one component of a comprehensive MRSA colonization surveillance program. It is not intended to diagnose MRSA infection nor to guide or monitor treatment for MRSA infections.      Scheduled Meds: . aspirin EC  81 mg Oral Daily  . budesonide (PULMICORT) nebulizer solution  0.5 mg Nebulization BID  . ceFEPime (MAXIPIME) IV  2 g Intravenous Q24H  . cholecalciferol  1,000 Units Oral Daily  . folic acid  1 mg Oral Daily  . furosemide  40 mg Intravenous Daily  . insulin aspart  0-9 Units Subcutaneous TID WC  . metoprolol tartrate  12.5 mg Oral BID  . pantoprazole  40 mg Oral Daily  . pravastatin  20 mg Oral Daily  . sodium chloride flush  10-40 mL Intracatheter Q12H  . vancomycin  750 mg Intravenous Q12H   Continuous Infusions: . dextrose 20 mL/hr at 07/18/15 0726  . diltiazem (CARDIZEM) infusion 5 mg/hr (07/19/15 1110)     Ismahan Lippman, DO  [facility] Pager [phone]  If 7PM-7AM, please contact night-coverage [email or web address] Password [email or web address] 07/19/2015, 2:09 PM   LOS: 2 days

## 2015-07-19 NOTE — Progress Notes (Signed)
Patient ID: Curtis Frazier, male   DOB: 1940-10-30, 75 y.o.   MRN: [id number] Subjective:  The patient is somnolent but easily arousable. He is in no apparent distress.  Objective: Vital signs in last 24 hours: Temp:  [97.6 F (36.4 C)-98.5 F (36.9 C)] 98.5 F (36.9 C) (02/06 1200) Pulse Rate:  [61-141] 140 (02/06 1100) Resp:  [16-38] 33 (02/06 1100) BP: (91-133)/(53-116) 133/116 mmHg (02/06 1100) SpO2:  [85 %-100 %] 96 % (02/06 1100) FiO2 (%):  [40 %-100 %] 55 % (02/06 0831) Weight:  [93.3 kg (205 lb 11 oz)] 93.3 kg (205 lb 11 oz) (02/06 0300)  Intake/Output from previous day: 02/05 0701 - 02/06 0700 In: 3720.8 [P.O.:720; I.V.:2600.8; IV Piggyback:400] Out: 1500 [Urine:1500] Intake/Output this shift:    Physical exam the patient is alert and oriented to person, [facility], etc. He answers questions appropriately. He is moving his lower extremities well. His wound is healing well with the sutures in place. I don't see any drainage or clear signs of infection.  Lab Results:  Recent Labs  07/18/15 0242 07/19/15 0230  WBC 9.0 8.6  HGB 9.7* 8.5*  HCT 29.6* 25.0*  PLT 134* 134*   BMET  Recent Labs  07/18/15 0242 07/19/15 0230  NA 136 133*  K 3.9 4.5  CL 103 102  CO2 20* 20*  GLUCOSE 65 242*  BUN 16 20  CREATININE 1.45* 1.92*  CALCIUM 7.6* 7.1*    Studies/Results: Ct Head Wo Contrast  07/17/2015  CLINICAL DATA:  pt awoke this morning "not acting right" has been experiencing acute confusion. Pt w/ back surgery x2 days ago, FEVER 102oriented to location, disoriented to situation and time EXAM: CT HEAD WITHOUT CONTRAST TECHNIQUE: Contiguous axial images were obtained from the base of the skull through the vertex without intravenous contrast. COMPARISON:  None. FINDINGS: Severe diffuse age-related atrophy with moderate low attenuation in the deep white matter. No evidence of vascular territory infarct or mass. No hemorrhage or extra-axial fluid. No hydrocephalus. The  calvarium is intact. There is near complete opacification of the right maxillary sinus. Several bilateral ethmoid air cells are opacified or show inflammatory change particularly on the right. Several mastoid air cells on the right are opacified. IMPRESSION: Age-related involutional change. Significant inflammatory change involving the maxillary sinus, with inflammatory change also involving right mastoid air cells and ethmoid air cells. Electronically Signed   By: Skipper Cliche M.D.   On: 07/17/2015 19:55   Ct Angio Chest Pe W/cm &/or Wo Cm  07/17/2015  CLINICAL DATA:  Back surgery 3 days ago with severe chest pain and shortness of breath. EXAM: CT ANGIOGRAPHY CHEST WITH CONTRAST TECHNIQUE: Multidetector CT imaging of the chest was performed using the standard protocol during bolus administration of intravenous contrast. Multiplanar CT image reconstructions and MIPs were obtained to evaluate the vascular anatomy. CONTRAST:  162mL OMNIPAQUE IOHEXOL 350 MG/ML SOLN COMPARISON:  07/05/2011 FINDINGS: THORACIC INLET/BODY WALL: Bilateral thyroid nodules measuring up to 11 mm, likely stable from 2013 when allowing streak artifact on previous study. No acute finding MEDIASTINUM: Mild cardiomegaly. No pericardial effusion. There is mild to moderate aortic valve calcifications/sclerosis without visible progression since 2013. Diffuse atherosclerosis, including the coronary arteries. No evidence of acute aortic syndrome. CTA of the pulmonary arteries is limited by bolus dispersion and respiratory motion at the bases, primarily affecting subsegmental vessels. There is no visible pulmonary embolism. Patulous esophagus. LUNG WINDOWS: Streaky opacities in the lower lungs with volume loss consistent with atelectasis. Intermittent  airway mucus/debris, also seen on the previous study. Interlobular septal thickening at the apices compatible with early edema. Probable emphysema, with uncertainty related to respiratory motion. UPPER  ABDOMEN: Cholelithiasis. OSSEOUS: T10 to lumbar posterior fixation. There are compression fractures of the T5, T7, and T8 vertebral bodies without acute/unhealed fracture line visible. Progressive and advanced adjacent segment disc degeneration above the fusion. Review of the MIP images confirms the above findings. IMPRESSION: 1. No evidence of pulmonary embolism. 2. Bibasilar atelectasis. 3. Early pulmonary edema. 4. Study degraded by respiratory motion. Electronically Signed   By: Monte Fantasia M.D.   On: 07/17/2015 21:37   Dg Chest Port 1 View  07/18/2015  CLINICAL DATA:  Line placement EXAM: PORTABLE CHEST 1 VIEW COMPARISON:  07/18/2015 FINDINGS: Right-sided PICC line has been placed, tip overlying the level of the superior vena cava. The heart is enlarged. There is pulmonary vascular congestion. Opacity at the medial left lung base is increased, consistent with developing infiltrate and/or atelectasis. IMPRESSION: 1. Right-sided PICC line tip to the superior vena cava. 2. Developing left lower lobe infiltrate and/or atelectasis. Electronically Signed   By: Nolon Nations M.D.   On: 07/18/2015 14:04   Dg Chest Port 1 View  07/18/2015  CLINICAL DATA:  Shortness of breath. EXAM: PORTABLE CHEST 1 VIEW COMPARISON:  July 17, 2015. FINDINGS: Stable cardiomegaly. No pneumothorax or pleural effusion is noted. Stable mild bibasilar subsegmental atelectasis is noted. Bony thorax is unremarkable. IMPRESSION: Stable mild bibasilar subsegmental atelectasis. Electronically Signed   By: Marijo Conception, M.D.   On: 07/18/2015 09:06   Dg Chest Port 1 View  07/17/2015  CLINICAL DATA:  Pt w/ back surgery x2 days ago, [facility] report temp of 102.0 on arrival and pt given 1gm APAP, pt also w/ SPO2 of 86% on room air, pt placed on simple mask at 6L/min and SPO2 up to 96%. Pt now under sepsis protocol and is experiencing acute confusion EXAM: PORTABLE CHEST 1 VIEW COMPARISON:  01/15/2015 FINDINGS: Heart is mildly enlarged.  There are no focal consolidations or pleural effusions. There is mild left lower lobe subsegmental atelectasis or scarring. Less likely this could represent early infectious infiltrate. No evidence for pulmonary edema. Previous thoracic spine fusion. IMPRESSION: 1. Cardiomegaly without pulmonary edema. 2. Left lower lobe atelectasis versus early infiltrate. Followup is recommended. Electronically Signed   By: Nolon Nations M.D.   On: 07/17/2015 18:06    Assessment/Plan: Postop day #4 status post lumbar discectomy: His wound seems to be doing well neurologically and his wound is healing well without obvious signs of infection.  Pneumonia: The patient is on antibiotics  Atrial fibrillation: I think it's okay to start anticoagulation from a surgical point of view. The risks of epidural hematoma at this point is low. Obviously thrombolytics are contraindicated.  Multiple medical problems: Noted  I spoke with the patient's daughters and son-in-law.  LOS: 2 days     Magaly Pollina D 07/19/2015, 12:08 PM

## 2015-07-19 NOTE — Progress Notes (Signed)
TELEMETRY: Reviewed telemetry pt in Atrial fibrillation with controlled rate.: Filed Vitals:   07/19/15 0800 07/19/15 0805 07/19/15 0831 07/19/15 0905  BP: 100/78     Pulse: 68   80  Temp:  97.9 F (36.6 C)    TempSrc:  Axillary    Resp: 19   33  Height:      Weight:      SpO2: 100%  95% 99%    Intake/Output Summary (Last 24 hours) at 07/19/15 0940 Last data filed at 07/19/15 0400  Gross per 24 hour  Intake   3642 ml  Output   1200 ml  Net   2442 ml   Filed Weights   07/17/15 1723 07/17/15 2250 07/19/15 0300  Weight: 83.915 kg (185 lb) 92.625 kg (204 lb 3.2 oz) 93.3 kg (205 lb 11 oz)    Subjective Patient sedated. On BIPAP. Son notes increasing agitation yesterday and last night. Once sedated he has slept well on BIPAP. No chest pain. Was SOB and had air hunger and hypoxemia when BIPAP removed.  Marland Kitchen aspirin EC  81 mg Oral Daily  . budesonide (PULMICORT) nebulizer solution  0.5 mg Nebulization BID  . ceFEPime (MAXIPIME) IV  2 g Intravenous Q24H  . cholecalciferol  1,000 Units Oral Daily  . clopidogrel  75 mg Oral Daily  . folic acid  1 mg Oral Daily  . insulin aspart  0-9 Units Subcutaneous TID WC  . metoprolol tartrate  12.5 mg Oral BID  . pantoprazole  40 mg Oral Daily  . pravastatin  20 mg Oral Daily  . sodium chloride flush  10-40 mL Intracatheter Q12H  . vancomycin  750 mg Intravenous Q12H   . sodium chloride 100 mL/hr at 07/19/15 0316  . dextrose 20 mL/hr at 07/18/15 0726  . diltiazem (CARDIZEM) infusion 5 mg/hr (07/19/15 0400)    LABS: Basic Metabolic Panel:  Recent Labs  07/18/15 0242 07/19/15 0230  NA 136 133*  K 3.9 4.5  CL 103 102  CO2 20* 20*  GLUCOSE 65 242*  BUN 16 20  CREATININE 1.45* 1.92*  CALCIUM 7.6* 7.1*   Liver Function Tests:  Recent Labs  07/17/15 1750  AST 42*  ALT 18  ALKPHOS 43  BILITOT 1.0  PROT 5.8*  ALBUMIN 2.9*   No results for input(s): LIPASE, AMYLASE in the last 72 hours. CBC:  Recent Labs  07/17/15 1750  07/18/15 0242 07/19/15 0230  WBC 8.5 9.0 8.6  NEUTROABS 5.4  --   --   HGB 10.1* 9.7* 8.5*  HCT 31.3* 29.6* 25.0*  MCV 98.4 99.7 97.3  PLT 156 134* 134*   Cardiac Enzymes:  Recent Labs  07/17/15 1750 07/18/15 0909 07/18/15 1334  TROPONINI 0.55* 1.01* 2.06*   BNP: No results for input(s): PROBNP in the last 72 hours. D-Dimer: No results for input(s): DDIMER in the last 72 hours. Hemoglobin A1C: No results for input(s): HGBA1C in the last 72 hours. Fasting Lipid Panel: No results for input(s): CHOL, HDL, LDLCALC, TRIG, CHOLHDL, LDLDIRECT in the last 72 hours. Thyroid Function Tests:  Recent Labs  07/18/15 1334  TSH 0.678     Radiology/Studies:  Ct Head Wo Contrast  07/17/2015  CLINICAL DATA:  pt awoke this morning "not acting right" has been experiencing acute confusion. Pt w/ back surgery x2 days ago, FEVER 102oriented to location, disoriented to situation and time EXAM: CT HEAD WITHOUT CONTRAST TECHNIQUE: Contiguous axial images were obtained from the base of the skull through the  vertex without intravenous contrast. COMPARISON:  None. FINDINGS: Severe diffuse age-related atrophy with moderate low attenuation in the deep white matter. No evidence of vascular territory infarct or mass. No hemorrhage or extra-axial fluid. No hydrocephalus. The calvarium is intact. There is near complete opacification of the right maxillary sinus. Several bilateral ethmoid air cells are opacified or show inflammatory change particularly on the right. Several mastoid air cells on the right are opacified. IMPRESSION: Age-related involutional change. Significant inflammatory change involving the maxillary sinus, with inflammatory change also involving right mastoid air cells and ethmoid air cells. Electronically Signed   By: Skipper Cliche M.D.   On: 07/17/2015 19:55   Ct Angio Chest Pe W/cm &/or Wo Cm  07/17/2015  CLINICAL DATA:  Back surgery 3 days ago with severe chest pain and shortness of  breath. EXAM: CT ANGIOGRAPHY CHEST WITH CONTRAST TECHNIQUE: Multidetector CT imaging of the chest was performed using the standard protocol during bolus administration of intravenous contrast. Multiplanar CT image reconstructions and MIPs were obtained to evaluate the vascular anatomy. CONTRAST:  142mL OMNIPAQUE IOHEXOL 350 MG/ML SOLN COMPARISON:  07/05/2011 FINDINGS: THORACIC INLET/BODY WALL: Bilateral thyroid nodules measuring up to 11 mm, likely stable from 2013 when allowing streak artifact on previous study. No acute finding MEDIASTINUM: Mild cardiomegaly. No pericardial effusion. There is mild to moderate aortic valve calcifications/sclerosis without visible progression since 2013. Diffuse atherosclerosis, including the coronary arteries. No evidence of acute aortic syndrome. CTA of the pulmonary arteries is limited by bolus dispersion and respiratory motion at the bases, primarily affecting subsegmental vessels. There is no visible pulmonary embolism. Patulous esophagus. LUNG WINDOWS: Streaky opacities in the lower lungs with volume loss consistent with atelectasis. Intermittent airway mucus/debris, also seen on the previous study. Interlobular septal thickening at the apices compatible with early edema. Probable emphysema, with uncertainty related to respiratory motion. UPPER ABDOMEN: Cholelithiasis. OSSEOUS: T10 to lumbar posterior fixation. There are compression fractures of the T5, T7, and T8 vertebral bodies without acute/unhealed fracture line visible. Progressive and advanced adjacent segment disc degeneration above the fusion. Review of the MIP images confirms the above findings. IMPRESSION: 1. No evidence of pulmonary embolism. 2. Bibasilar atelectasis. 3. Early pulmonary edema. 4. Study degraded by respiratory motion. Electronically Signed   By: Monte Fantasia M.D.   On: 07/17/2015 21:37   Dg Chest Port 1 View  07/18/2015  CLINICAL DATA:  Line placement EXAM: PORTABLE CHEST 1 VIEW COMPARISON:   07/18/2015 FINDINGS: Right-sided PICC line has been placed, tip overlying the level of the superior vena cava. The heart is enlarged. There is pulmonary vascular congestion. Opacity at the medial left lung base is increased, consistent with developing infiltrate and/or atelectasis. IMPRESSION: 1. Right-sided PICC line tip to the superior vena cava. 2. Developing left lower lobe infiltrate and/or atelectasis. Electronically Signed   By: Nolon Nations M.D.   On: 07/18/2015 14:04   Dg Chest Port 1 View  07/18/2015  CLINICAL DATA:  Shortness of breath. EXAM: PORTABLE CHEST 1 VIEW COMPARISON:  July 17, 2015. FINDINGS: Stable cardiomegaly. No pneumothorax or pleural effusion is noted. Stable mild bibasilar subsegmental atelectasis is noted. Bony thorax is unremarkable. IMPRESSION: Stable mild bibasilar subsegmental atelectasis. Electronically Signed   By: Marijo Conception, M.D.   On: 07/18/2015 09:06   Dg Chest Port 1 View  07/17/2015  CLINICAL DATA:  Pt w/ back surgery x2 days ago, [facility] report temp of 102.0 on arrival and pt given 1gm APAP, pt also w/ SPO2 of 86% on  room air, pt placed on simple mask at 6L/min and SPO2 up to 96%. Pt now under sepsis protocol and is experiencing acute confusion EXAM: PORTABLE CHEST 1 VIEW COMPARISON:  01/15/2015 FINDINGS: Heart is mildly enlarged. There are no focal consolidations or pleural effusions. There is mild left lower lobe subsegmental atelectasis or scarring. Less likely this could represent early infectious infiltrate. No evidence for pulmonary edema. Previous thoracic spine fusion. IMPRESSION: 1. Cardiomegaly without pulmonary edema. 2. Left lower lobe atelectasis versus early infiltrate. Followup is recommended. Electronically Signed   By: Nolon Nations M.D.   On: 07/17/2015 18:06    PHYSICAL EXAM General: Elderly, obese WM sedated on BIPAP. Head: Normal Neck: Negative for carotid bruits. JVD not elevated. No adenopathy Lungs: Coarse BS bilatearlly Heart:  IRRR S1 S2 with 2/6 SEM RUSB. Abdomen: Soft, non-tender, non-distended with normoactive bowel sounds.  Msk:  Strength and tone appears normal for age. Extremities: Tr edema.  Distal pedal pulses are 2+ and equal bilaterally. Neuro: Alert and oriented X 3. Moves all extremities spontaneously. Psych:  Responds to questions appropriately with a normal affect.  ASSESSMENT AND PLAN: 1. Acute sepsis. ?secondary to LLL PNA. Antibiotics as per primary team. 2. Acute respiratory failure with hypoxemia secondary to #1. 3. Afib with RVR. Rate now controlled on IV cardizem and low dose metoprolol. Titration limited by low BP. Rate worse when agitated. Similar Afib following lumbar surgery in 2013 resolved on amiodarone. [place] vasc score of 5. Will stop Plavix given plans to anticoagulate.  4. Elevated troponin. Initial Ecg in sinus rhythm showed no acute change. Repeat in AFib with RVR showed new ST depression in the lateral leads. I suspect this is mostly demand ischemia in setting of sepsis, PNA, hypoxemia, anemia, afib, etc.  5. Acute on CKD stage 3 6. RA on chronic steroids/ methotrexate. 7. DM type 2. 8. S/p lumbar laminectomy last Thursday 07/15/15.  9. CAD s/p BMS LCx in 2010. Myoview 05/12/15 was low risk with mild apical thinning. 10. Mild Aortic stenosis  Plan: will continue current rate control. If Afib becomes difficult to control may load with IV amiodarone. Would consider anticoagulation with IV heparin if OK with neurosurgery with recent lumbar surgery. Will check Echo. I would not favor pursuing cardiac cath at this point given his multiple medical problems, acute sepsis and rising creatinine. Will need to follow as his medical issues are treated.   Present on Admission:  . Sepsis ([place]) . Acute encephalopathy . LLL pneumonia . Rheumatoid arthritis ([place]) . Coronary atherosclerosis . Essential hypertension, benign . Elevated troponin  Signed, Peter Martinique, [place] 07/19/2015 9:40  AM

## 2015-07-19 NOTE — Progress Notes (Signed)
SLP Cancellation Note  Patient Details Name: Curtis Frazier MRN: [id number] DOB: 05-27-1941   Cancelled treatment:       Reason Eval/Treat Not Completed: Medical issues which prohibited therapy. Pt on BiPAP. Will follow for readiness.    Janaiyah Blackard, Katherene Ponto 07/19/2015, 9:41 AM

## 2015-07-19 NOTE — Progress Notes (Signed)
Pt continuing to have increased WOB; paged T. Callahan for BiPAP and PRN Ativan orders. Fredirick Maudlin to pt bedside; pt placed on BiPAP. Pt continues to be agitated, rapid response called to assess pt. Received orders for 5 mg haldol; given to pt along with 1 mg Dilaudid. Has responded appropriately and is now tolerating BiPAP; will continue to monitor closely.

## 2015-07-20 ENCOUNTER — Inpatient Hospital Stay (HOSPITAL_COMMUNITY): Payer: Medicare Other

## 2015-07-20 ENCOUNTER — Other Ambulatory Visit (HOSPITAL_COMMUNITY): Payer: Medicare Other

## 2015-07-20 DIAGNOSIS — R600 Localized edema: Secondary | ICD-10-CM

## 2015-07-20 DIAGNOSIS — G9340 Encephalopathy, unspecified: Secondary | ICD-10-CM

## 2015-07-20 DIAGNOSIS — I5031 Acute diastolic (congestive) heart failure: Secondary | ICD-10-CM

## 2015-07-20 LAB — CBC
HEMATOCRIT: 26.8 % — AB (ref 39.0–52.0)
Hemoglobin: 8.7 g/dL — ABNORMAL LOW (ref 13.0–17.0)
MCH: 31.2 pg (ref 26.0–34.0)
MCHC: 32.5 g/dL (ref 30.0–36.0)
MCV: 96.1 fL (ref 78.0–100.0)
PLATELETS: 158 10*3/uL (ref 150–400)
RBC: 2.79 MIL/uL — ABNORMAL LOW (ref 4.22–5.81)
RDW: 16.3 % — AB (ref 11.5–15.5)
WBC: 6.4 10*3/uL (ref 4.0–10.5)

## 2015-07-20 LAB — VANCOMYCIN, TROUGH: Vancomycin Tr: 19 ug/mL (ref 10.0–20.0)

## 2015-07-20 LAB — GLUCOSE, CAPILLARY
GLUCOSE-CAPILLARY: 154 mg/dL — AB (ref 65–99)
GLUCOSE-CAPILLARY: 178 mg/dL — AB (ref 65–99)
Glucose-Capillary: 208 mg/dL — ABNORMAL HIGH (ref 65–99)
Glucose-Capillary: 210 mg/dL — ABNORMAL HIGH (ref 65–99)

## 2015-07-20 LAB — BASIC METABOLIC PANEL
Anion gap: 14 (ref 5–15)
BUN: 21 mg/dL — AB (ref 6–20)
CALCIUM: 7.3 mg/dL — AB (ref 8.9–10.3)
CO2: 20 mmol/L — ABNORMAL LOW (ref 22–32)
CREATININE: 1.62 mg/dL — AB (ref 0.61–1.24)
Chloride: 104 mmol/L (ref 101–111)
GFR calc Af Amer: 46 mL/min — ABNORMAL LOW (ref >60)
GFR, EST NON AFRICAN AMERICAN: 40 mL/min — AB (ref >60)
GLUCOSE: 134 mg/dL — AB (ref 65–99)
POTASSIUM: 3.6 mmol/L (ref 3.5–5.1)
Sodium: 138 mmol/L (ref 135–145)

## 2015-07-20 LAB — HEPARIN LEVEL (UNFRACTIONATED): HEPARIN UNFRACTIONATED: 0.17 [IU]/mL — AB (ref 0.30–0.70)

## 2015-07-20 MED ORDER — WHITE PETROLATUM GEL
Status: AC
  Administered 2015-07-20: 0.2
  Filled 2015-07-20: qty 1

## 2015-07-20 MED ORDER — METOPROLOL TARTRATE 25 MG PO TABS
25.0000 mg | ORAL_TABLET | Freq: Two times a day (BID) | ORAL | Status: DC
  Administered 2015-07-20: 25 mg via ORAL
  Filled 2015-07-20 (×3): qty 1

## 2015-07-20 MED ORDER — HEPARIN BOLUS VIA INFUSION
1500.0000 [IU] | Freq: Once | INTRAVENOUS | Status: AC
  Administered 2015-07-20: 1500 [IU] via INTRAVENOUS
  Filled 2015-07-20: qty 1500

## 2015-07-20 MED ORDER — HEPARIN (PORCINE) IN NACL 100-0.45 UNIT/ML-% IJ SOLN
1650.0000 [IU]/h | INTRAMUSCULAR | Status: DC
  Administered 2015-07-20: 1300 [IU]/h via INTRAVENOUS
  Administered 2015-07-21: 1900 [IU]/h via INTRAVENOUS
  Administered 2015-07-22 – 2015-07-26 (×8): 1750 [IU]/h via INTRAVENOUS
  Filled 2015-07-20 (×19): qty 250

## 2015-07-20 MED ORDER — HEPARIN BOLUS VIA INFUSION
4000.0000 [IU] | Freq: Once | INTRAVENOUS | Status: AC
  Administered 2015-07-20: 4000 [IU] via INTRAVENOUS
  Filled 2015-07-20: qty 4000

## 2015-07-20 NOTE — Progress Notes (Signed)
Echocardiogram 2D Echocardiogram has been performed.  07/20/2015 3:40 PM Maudry Mayhew, RVT, RDCS, RDMS

## 2015-07-20 NOTE — Progress Notes (Signed)
ANTICOAGULATION CONSULT NOTE  Pharmacy Consult for Heparin Indication: atrial fibrillation  Patient Measurements: Height: 5\' 8"  (172.7 cm) Weight: 204 lb 9.4 oz (92.8 kg) IBW/kg (Calculated) : 68.4 Heparin Dosing Weight: 88 kg  Vital Signs: Temp: 99.9 F (37.7 C) (02/07 1933) Temp Source: Oral (02/07 1933) BP: 104/59 mmHg (02/07 2000) Pulse Rate: 105 (02/07 2000)  Labs:  Recent Labs  07/17/15 2349  07/18/15 0242 07/18/15 0909 07/18/15 1334 07/18/15 1803 07/19/15 0230 07/20/15 0400 07/20/15 2000  HGB  --   < > 9.7*  --   --   --  8.5* 8.7*  --   HCT  --   --  29.6*  --   --   --  25.0* 26.8*  --   PLT  --   --  134*  --   --   --  134* 158  --   APTT 34  --   --   --   --   --   --   --   --   LABPROT 15.0  --   --   --   --   --   --   --   --   INR 1.17  --   --   --   --   --   --   --   --   HEPARINUNFRC  --   --   --   --   --  <0.10*  --   --  0.17*  CREATININE  --   --  1.45*  --   --   --  1.92* 1.62*  --   TROPONINI  --   --   --  1.01* 2.06*  --   --   --   --   < > = values in this interval not displayed.  Estimated Creatinine Clearance: 43.6 mL/min (by C-G formula based on Cr of 1.62).   Medical History: Past Medical History  Diagnosis Date  . Other and unspecified hyperlipidemia   . Coronary atherosclerosis of unspecified type of vessel, native or graft   . Personal history of colonic polyps   . Actinic keratosis   . 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 Other psoriasis   . Scoliosis (and kyphoscoliosis), idiopathic   . GI bleed 12/10    cecam AVM  . Gastritis 12/10  . Hyperlipidemia     takes Pravastatin daily  . Hypertension     takes Metoprolol daily  . Myocardial infarction ([place]) 2010  . Shortness of breath     with exertion  . Pneumonia     hx of-2008  . Headache(784.0)     occasionally  . Joint pain   . Joint swelling   . Rheumatoid arthritis(714.0)     takes Methotrexate 7pills weekly  . Degeneration of intervertebral disc, site  unspecified   . Chronic back pain     scoliosis/stenosis/radiculopathy,degenerative disc disease  . Psoriasis   . Bruises easily   . Esophageal reflux     takes Omeprazole daily  . History of colonic polyps   . Hemorrhoids   . Kidney stone     hx of  . Blood transfusion 2010  . Type II or unspecified type diabetes mellitus without mention of complication, not stated as uncontrolled     type II;controlled by diet and exercise  . PMR (polymyalgia rheumatica) ([facility]) 01/20/2012    tx by specialist on a long prednisone taper  . S/P PTCA (percutaneous transluminal  coronary angioplasty)     Medications:  Scheduled:  . antiseptic oral rinse  7 mL Mouth Rinse q12n4p  . aspirin EC  81 mg Oral Daily  . budesonide (PULMICORT) nebulizer solution  0.5 mg Nebulization BID  . ceFEPime (MAXIPIME) IV  2 g Intravenous Q24H  . chlorhexidine  15 mL Mouth Rinse BID  . cholecalciferol  1,000 Units Oral Daily  . folic acid  1 mg Oral Daily  . furosemide  40 mg Intravenous Daily  . heparin  1,500 Units Intravenous Once  . insulin aspart  0-9 Units Subcutaneous TID WC  . metoprolol tartrate  25 mg Oral BID  . pantoprazole  40 mg Oral Daily  . pravastatin  20 mg Oral Daily  . sodium chloride flush  10-40 mL Intracatheter Q12H  . vancomycin  750 mg Intravenous Q12H   Infusions:  . dextrose 20 mL/hr at 07/18/15 0726  . diltiazem (CARDIZEM) infusion 12.5 mg/hr (07/20/15 1930)  . heparin 1,550 Units/hr (07/20/15 2131)   PRN: acetaminophen **OR** acetaminophen, alum & mag hydroxide-simeth, baclofen, HYDROmorphone (DILAUDID) injection, levalbuterol, LORazepam, nitroGLYCERIN, ondansetron **OR** ondansetron (ZOFRAN) IV, oxyCODONE, sodium chloride flush  Assessment: 74 YOM that presented on 2/4 with AMS.  Patient developed new onset Afib with RVR on 07/18/15.  Pharmacy is consulted to dose heparin for afib. Pt's plavix has been stopped and starting heparin was ok'ed by neurosurg. No anticoagulation PTA.  CHADSVASc = 5. Hgb 8.7, Plts 158.   HL 0.17 (goal 0.3-0.7) on 1300 units/hr. No issues with drip per RN or sxs of bleeding.  Goal of Therapy:  Heparin level 0.3-0.7 units/ml Monitor platelets by anticoagulation protocol: Yes   Plan:  1. Heparin 1500 unit bolus 2. Increase heparin drip to 1550 units/hr 3. HL in am    Vincenza Hews, PharmD, BCPS 07/20/2015, 9:36 PM Pager: [phone]

## 2015-07-20 NOTE — Progress Notes (Signed)
Patient ID: Curtis Frazier, male   DOB: 1940/12/25, 75 y.o.   MRN: [id number] Subjective:  The patient is alert and pleasant.  He looks better today.  I spoke with his son. The patient denies headaches.  Objective: Vital signs in last 24 hours: Temp:  [97.9 F (36.6 C)-100.5 F (38.1 C)] 98.6 F (37 C) (02/07 0339) Pulse Rate:  [80-140] 98 (02/07 0700) Resp:  [15-37] 17 (02/07 0700) BP: (91-133)/(51-116) 118/66 mmHg (02/07 0700) SpO2:  [85 %-100 %] 99 % (02/07 0700) FiO2 (%):  [40 %-55 %] 40 % (02/07 0300) Weight:  [92.8 kg (204 lb 9.4 oz)] 92.8 kg (204 lb 9.4 oz) (02/07 0300)  Intake/Output from previous day: 02/06 0701 - 02/07 0700 In: 486.4 [I.V.:136.4; IV Piggyback:350] Out: 2850 [Urine:2850] Intake/Output this shift:    Physical exam The patient is alert and oriented 3.  He is moving his lower extremities well.  His wound is healing well.  I don't see any drainage.  Lab Results:  Recent Labs  07/19/15 0230 07/20/15 0400  WBC 8.6 6.4  HGB 8.5* 8.7*  HCT 25.0* 26.8*  PLT 134* 158   BMET  Recent Labs  07/19/15 0230 07/20/15 0400  NA 133* 138  K 4.5 3.6  CL 102 104  CO2 20* 20*  GLUCOSE 242* 134*  BUN 20 21*  CREATININE 1.92* 1.62*  CALCIUM 7.1* 7.3*    Studies/Results: Dg Chest Port 1 View  07/19/2015  CLINICAL DATA:  Respiratory distress EXAM: PORTABLE CHEST 1 VIEW COMPARISON:  07/18/2015 FINDINGS: Cardiomegaly with vascular congestion and diffuse interstitial prominence compatible with edema. Right PICC line is in place with the tip at the cavoatrial junction. Possible layering left effusion. No acute bony abnormality. IMPRESSION: Findings compatible with CHF.  Possible small left effusion. Electronically Signed   By: Rolm Baptise M.D.   On: 07/19/2015 13:40   Dg Chest Port 1 View  07/18/2015  CLINICAL DATA:  Line placement EXAM: PORTABLE CHEST 1 VIEW COMPARISON:  07/18/2015 FINDINGS: Right-sided PICC line has been placed, tip overlying the level of the  superior vena cava. The heart is enlarged. There is pulmonary vascular congestion. Opacity at the medial left lung base is increased, consistent with developing infiltrate and/or atelectasis. IMPRESSION: 1. Right-sided PICC line tip to the superior vena cava. 2. Developing left lower lobe infiltrate and/or atelectasis. Electronically Signed   By: Nolon Nations M.D.   On: 07/18/2015 14:04   Dg Chest Port 1 View  07/18/2015  CLINICAL DATA:  Shortness of breath. EXAM: PORTABLE CHEST 1 VIEW COMPARISON:  July 17, 2015. FINDINGS: Stable cardiomegaly. No pneumothorax or pleural effusion is noted. Stable mild bibasilar subsegmental atelectasis is noted. Bony thorax is unremarkable. IMPRESSION: Stable mild bibasilar subsegmental atelectasis. Electronically Signed   By: Marijo Conception, M.D.   On: 07/18/2015 09:06    Assessment/Plan: Postop day #5: The patient is doing well neurologically.  I will ask the nurses to apply a dressing to better know whether he has any drainage.   LOS: 3 days     Ardean Melroy D 07/20/2015, 8:04 AM

## 2015-07-20 NOTE — Progress Notes (Signed)
[place] for Heparin Indication: atrial fibrillation  Allergies  Allergen Reactions  . Ace Inhibitors Other (See Comments)    Causes high K  . Angiotensin Receptor Blockers Other (See Comments)    Causes high K  . Metoprolol Other (See Comments)    May cause elevated K level  . Penicillins Swelling and Rash  . Tetracycline Swelling and Rash    Patient Measurements: Height: 5\' 8"  (172.7 cm) Weight: 204 lb 9.4 oz (92.8 kg) IBW/kg (Calculated) : 68.4 Heparin Dosing Weight: 88 kg  Vital Signs: Temp: 98.6 F (37 C) (02/07 0339) Temp Source: Axillary (02/07 0339) BP: 114/71 mmHg (02/07 0800) Pulse Rate: 108 (02/07 0800)  Labs:  Recent Labs  07/17/15 1750 07/17/15 2349 07/18/15 0242 07/18/15 0909 07/18/15 1334 07/18/15 1803 07/19/15 0230 07/20/15 0400  HGB 10.1*  --  9.7*  --   --   --  8.5* 8.7*  HCT 31.3*  --  29.6*  --   --   --  25.0* 26.8*  PLT 156  --  134*  --   --   --  134* 158  APTT  --  34  --   --   --   --   --   --   LABPROT  --  15.0  --   --   --   --   --   --   INR  --  1.17  --   --   --   --   --   --   HEPARINUNFRC  --   --   --   --   --  <0.10*  --   --   CREATININE 1.76*  --  1.45*  --   --   --  1.92* 1.62*  TROPONINI 0.55*  --   --  1.01* 2.06*  --   --   --     Estimated Creatinine Clearance: 43.6 mL/min (by C-G formula based on Cr of 1.62).   Medical History: Past Medical History  Diagnosis Date  . Other and unspecified hyperlipidemia   . Coronary atherosclerosis of unspecified type of vessel, native or graft   . Personal history of colonic polyps   . Actinic keratosis   . Psoriatic arthropathy ([place])   . Other psoriasis   . Scoliosis (and kyphoscoliosis), idiopathic   . GI bleed 12/10    cecam AVM  . Gastritis 12/10  . Hyperlipidemia     takes Pravastatin daily  . Hypertension     takes Metoprolol daily  . Myocardial infarction ([place]) 2010  . Shortness of breath     with exertion  .  Pneumonia     hx of-2008  . Headache(784.0)     occasionally  . Joint pain   . Joint swelling   . Rheumatoid arthritis(714.0)     takes Methotrexate 7pills weekly  . Degeneration of intervertebral disc, site unspecified   . Chronic back pain     scoliosis/stenosis/radiculopathy,degenerative disc disease  . Psoriasis   . Bruises easily   . Esophageal reflux     takes Omeprazole daily  . History of colonic polyps   . Hemorrhoids   . Kidney stone     hx of  . Blood transfusion 2010  . Type II or unspecified type diabetes mellitus without mention of complication, not stated as uncontrolled     type II;controlled by diet and exercise  . PMR (polymyalgia rheumatica) (HCC) 01/20/2012  tx by specialist on a long prednisone taper  . S/P PTCA (percutaneous transluminal coronary angioplasty)     Medications:  Scheduled:  . antiseptic oral rinse  7 mL Mouth Rinse q12n4p  . aspirin EC  81 mg Oral Daily  . budesonide (PULMICORT) nebulizer solution  0.5 mg Nebulization BID  . ceFEPime (MAXIPIME) IV  2 g Intravenous Q24H  . chlorhexidine  15 mL Mouth Rinse BID  . cholecalciferol  1,000 Units Oral Daily  . folic acid  1 mg Oral Daily  . furosemide  40 mg Intravenous Daily  . insulin aspart  0-9 Units Subcutaneous TID WC  . metoprolol tartrate  25 mg Oral BID  . pantoprazole  40 mg Oral Daily  . pravastatin  20 mg Oral Daily  . sodium chloride flush  10-40 mL Intracatheter Q12H  . vancomycin  750 mg Intravenous Q12H   Infusions:  . dextrose 20 mL/hr at 07/18/15 0726  . diltiazem (CARDIZEM) infusion 10 mg/hr (07/20/15 0436)   PRN: acetaminophen **OR** acetaminophen, alum & mag hydroxide-simeth, baclofen, HYDROmorphone (DILAUDID) injection, levalbuterol, LORazepam, nitroGLYCERIN, ondansetron **OR** ondansetron (ZOFRAN) IV, oxyCODONE, sodium chloride flush  Assessment: 74 YOM that presented on 2/4 with AMS.  Patient developed new onset Afib with RVR on 07/18/15.  Pharmacy is consulted to  dose heparin for afib. Pt's plavix has been stopped and starting heparin was ok'ed by neurosurg. No anticoagulation PTA. CHADSVASc = 5. Hgb 8.7, Plts 158.  Goal of Therapy:  Heparin level 0.3-0.7 units/ml Monitor platelets by anticoagulation protocol: Yes   Plan:  Give 4000 units bolus x 1 Start heparin infusion at 1300 units/hr Check anti-Xa level in 8 hours and daily while on heparin Continue to monitor H&H and platelets  Monitor for s/sx of bleeding   Andrey Cota. Diona Foley, PharmD, BCPS Clinical Pharmacist Pager [phone]  07/20/2015,11:44 AM

## 2015-07-20 NOTE — Care Management Note (Signed)
Case Management Note  Patient Details  Name: Curtis Frazier MRN: [id number] Date of Birth: 02-28-1941  Subjective/Objective:   Date: 07/20/15 Spoke with patient's son and daughter on 07/19/15, patient unable to give information, on nrb and not oriented . Introduced self as Tourist information centre manager and explained role in discharge planning and how to be reached.  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 All family members work , they will not be able to give full time supervision for patient or his wife.  NCM gave family resources for Adult Day Care ([place]) , [facility] and a list of ALF's in [facility] (specifically for wife).   Expressed potential need for no other DME.  Disposition uncertain at that time, family interested in speaking with Palliative care. Patient has Medicare insurance Patient  is driven by family to MD appointments.  Patient has PCP Dr. Loura Pardon.   Plan: CM will continue to follow for discharge planning and [facility] resources.                  Action/Plan: Palliative consulted.  Expected Discharge Date:                  Expected Discharge Plan:  [place]  In-House Referral:     Discharge planning Services  CM Consult  Post Acute Care Choice:    Choice offered to:     DME Arranged:    DME Agency:     HH Arranged:    [place] Agency:     Status of Service:  In process, will continue to follow  Medicare Important Message Given:    Date Medicare IM Given:    Medicare IM give by:    Date Additional Medicare IM Given:    Additional Medicare Important Message give by:     If discussed at [place] of Stay Meetings, dates discussed:    Additional Comments:  Zenon Mayo, RN 07/20/2015, 3:43 PM

## 2015-07-20 NOTE — Progress Notes (Signed)
Pharmacy Antibiotic Note  Curtis Frazier is a 75 y.o. male admitted on 07/17/2015 with sepsis.  Pharmacy has been consulted for vancomycin dosing and MD dosed cefepime. VT this evening was within goal at 19 mcg/ml  Plan: ContinueVancomycin 750mg  IV every 12 hours.  Goal trough 15-20 mcg/mL. Cefepime 2g IV q24h  Monitor culture data, renal function and clinical course  Height: 5\' 8"  (172.7 cm) Weight: 204 lb 9.4 oz (92.8 kg) IBW/kg (Calculated) : 68.4  Temp (24hrs), Avg:99.1 F (37.3 C), Min:98.5 F (36.9 C), Max:100.8 F (38.2 C)   Recent Labs Lab 07/17/15 1750 07/17/15 1759 07/17/15 2022 07/17/15 2349 07/18/15 0242 07/18/15 0247 07/19/15 0230 07/20/15 0400 07/20/15 1741  WBC 8.5  --   --   --  9.0  --  8.6 6.4  --   CREATININE 1.76*  --   --   --  1.45*  --  1.92* 1.62*  --   LATICACIDVEN  --  1.14 0.85 1.2  --  1.0  --   --   --   VANCOTROUGH  --   --   --   --   --   --   --   --  19    Estimated Creatinine Clearance: 43.6 mL/min (by C-G formula based on Cr of 1.62).    Allergies  Allergen Reactions  . Ace Inhibitors Other (See Comments)    Causes high K  . Angiotensin Receptor Blockers Other (See Comments)    Causes high K  . Metoprolol Other (See Comments)    May cause elevated K level  . Penicillins Swelling and Rash  . Tetracycline Swelling and Rash    Antimicrobials this admission: Cefepime 2/5>> Vanc 2/4 >> Ampicillin 2/4>>2/5 Ceftriaxone 2/4>>2/4  Dose adjustments this admission: VT 19 on 750 mg q12h  Microbiology results: 2/4 MRSA PCR Nasal Neg 2/4 BCx2: ngtd 2/4 UCx: mult species  Levester Fresh, PharmD, BCPS, [facility] Clinical Pharmacist Pager [phone] 07/20/2015 6:56 PM

## 2015-07-20 NOTE — Progress Notes (Signed)
RT took pt off  BiPAP for neb treatment. Placed pt on 5L during treatment. O2 saturation remained good, 96%. Left pt on nasal cannula with BiPAP still in room. RT will continue to follow.

## 2015-07-20 NOTE — Progress Notes (Signed)
VASCULAR LAB PRELIMINARY  PRELIMINARY  PRELIMINARY  PRELIMINARY   Bilateral lower extremity venous Doppler has been completed.  Bilateral:  No evidence of DVT, superficial thrombosis, or Baker's Cyst.   Janifer Adie, RVT, RDMS 07/20/2015, 2:50 PM

## 2015-07-20 NOTE — Progress Notes (Signed)
TELEMETRY: Reviewed telemetry pt in Atrial fibrillation with rate 110-120: Filed Vitals:   07/20/15 0600 07/20/15 0700 07/20/15 0800 07/20/15 0802  BP: 112/65 118/66 114/71   Pulse: 100 98 108   Temp:      TempSrc:      Resp: 22 17    Height:      Weight:      SpO2: 98% 99% 100% 98%    Intake/Output Summary (Last 24 hours) at 07/20/15 1133 Last data filed at 07/20/15 0515  Gross per 24 hour  Intake 486.38 ml  Output   2850 ml  Net -2363.62 ml   Filed Weights   07/17/15 2250 07/19/15 0300 07/20/15 0300  Weight: 92.625 kg (204 lb 3.2 oz) 93.3 kg (205 lb 11 oz) 92.8 kg (204 lb 9.4 oz)    Subjective Patient awake and alert today. On [place] oxygen. States breathing is better but still short with activity. No chest pain.   Marland Kitchen antiseptic oral rinse  7 mL Mouth Rinse q12n4p  . aspirin EC  81 mg Oral Daily  . budesonide (PULMICORT) nebulizer solution  0.5 mg Nebulization BID  . ceFEPime (MAXIPIME) IV  2 g Intravenous Q24H  . chlorhexidine  15 mL Mouth Rinse BID  . cholecalciferol  1,000 Units Oral Daily  . folic acid  1 mg Oral Daily  . furosemide  40 mg Intravenous Daily  . insulin aspart  0-9 Units Subcutaneous TID WC  . metoprolol tartrate  25 mg Oral BID  . pantoprazole  40 mg Oral Daily  . pravastatin  20 mg Oral Daily  . sodium chloride flush  10-40 mL Intracatheter Q12H  . vancomycin  750 mg Intravenous Q12H   . dextrose 20 mL/hr at 07/18/15 0726  . diltiazem (CARDIZEM) infusion 10 mg/hr (07/20/15 0436)    LABS: Basic Metabolic Panel:  Recent Labs  07/19/15 0230 07/20/15 0400  NA 133* 138  K 4.5 3.6  CL 102 104  CO2 20* 20*  GLUCOSE 242* 134*  BUN 20 21*  CREATININE 1.92* 1.62*  CALCIUM 7.1* 7.3*   Liver Function Tests:  Recent Labs  07/17/15 1750  AST 42*  ALT 18  ALKPHOS 43  BILITOT 1.0  PROT 5.8*  ALBUMIN 2.9*   No results for input(s): LIPASE, AMYLASE in the last 72 hours. CBC:  Recent Labs  07/17/15 1750  07/19/15 0230 07/20/15 0400    WBC 8.5  < > 8.6 6.4  NEUTROABS 5.4  --   --   --   HGB 10.1*  < > 8.5* 8.7*  HCT 31.3*  < > 25.0* 26.8*  MCV 98.4  < > 97.3 96.1  PLT 156  < > 134* 158  < > = values in this interval not displayed. Cardiac Enzymes:  Recent Labs  07/17/15 1750 07/18/15 0909 07/18/15 1334  TROPONINI 0.55* 1.01* 2.06*   BNP: No results for input(s): PROBNP in the last 72 hours. D-Dimer: No results for input(s): DDIMER in the last 72 hours. Hemoglobin A1C:  Recent Labs  07/18/15 0246  HGBA1C 7.4*   Fasting Lipid Panel: No results for input(s): CHOL, HDL, LDLCALC, TRIG, CHOLHDL, LDLDIRECT in the last 72 hours. Thyroid Function Tests:  Recent Labs  07/18/15 1334  TSH 0.678     Radiology/Studies:   Study Result     CLINICAL DATA: Respiratory distress  EXAM: PORTABLE CHEST 1 VIEW  COMPARISON: 07/18/2015  FINDINGS: Cardiomegaly with vascular congestion and diffuse interstitial prominence compatible with edema. Right PICC  line is in place with the tip at the cavoatrial junction. Possible layering left effusion. No acute bony abnormality.  IMPRESSION: Findings compatible with CHF. Possible small left effusion.   Electronically Signed  By: Rolm Baptise M.D.  On: 07/19/2015 13:40     PHYSICAL EXAM General: Elderly, obese WM NAD Head: Normal Neck: Negative for carotid bruits. JVD not elevated. No adenopathy Lungs: Coarse BS bilatearlly Heart: IRRR S1 S2 with 2/6 SEM RUSB. Abdomen: Soft, non-tender, non-distended with normoactive bowel sounds.  Msk:  Strength and tone appears normal for age. Extremities: Tr edema.  Distal pedal pulses are 2+ and equal bilaterally. Neuro: Alert and oriented X 3. Moves all extremities spontaneously. Psych:  Responds to questions appropriately with a normal affect.  ASSESSMENT AND PLAN: 1. Acute sepsis. Antibiotics as per primary team. 2. Acute respiratory failure with hypoxemia secondary to #1 and volume overload from  fluid resuscitation with sepsis protocol. BNP elevated. Still awaiting Echo to assess LV function. Excellent response to IV lasix. I/O negative 2363 cc since yesterday. Additional IV lasix given this am.  3. Afib with RVR. Rate with fair control on IV cardizem and low dose metoprolol. Will increase metoprolol to 25 mg bid.  Similar Afib following lumbar surgery in 2013 resolved on amiodarone. [place] vasc score of 5. Will stop Plavix given plans to anticoagulate. Will start IV heparin for now. OK per Neurosurgery. Will switch to po once we are sure he doesn't need further invasive procedures. 4. Elevated troponin. Initial Ecg in sinus rhythm showed no acute change. Repeat in AFib with RVR showed new ST depression in the lateral leads. I suspect this is mostly demand ischemia in setting of sepsis, PNA, hypoxemia, anemia, afib, etc. Awaiting Echo. If he has new LV dysfunction may need to consider further ischemic work up. I am still reluctant to recommend cardiac cath with multiple medical problems and rising creatinine.  5. Acute on CKD stage 3 6. RA on chronic steroids/ methotrexate. 7. DM type 2. 8. S/p lumbar laminectomy last Thursday 07/15/15.  9. CAD s/p BMS LCx in 2010. Myoview 05/12/15 was low risk with mild apical thinning. 10. Mild Aortic stenosis   Present on Admission:  . Sepsis ([place]) . Acute encephalopathy . LLL pneumonia . Rheumatoid arthritis ([place]) . Coronary atherosclerosis . Essential hypertension, benign . Elevated troponin  Signed, Issai Werling [place], [place] 07/20/2015 11:33 AM

## 2015-07-20 NOTE — Progress Notes (Signed)
Palliative Care RN Note: Spoke with family. They are set to meet with team provider tomorrow morning at 1000. Larina Earthly, RN, BSN, [facility] 07/20/2015 4:26 PM

## 2015-07-20 NOTE — Progress Notes (Signed)
PROGRESS NOTE  Curtis Frazier [id number] DOB: [birth year] DOA: 07/17/2015 PCP: Loura Pardon, MD  Brief History 75 year old male with a history of coronary artery disease status post stent placement, hypertension, hyperlipidemia, diabetes mellitus, rheumatoid arthritis presented with confusion as reported by his family. The patient also had a fever 102.34F with persistent nonproductive cough and shortness of breath. He was noted to be hypoxic by EMS with oxygen saturation of 86% and had 102.0 fever per EMS. This improved with supplement oxygen. Workup in the emergency department revealed serum creatinine 1.76, the WBC 8.5, lactic acid 1.4, and a chest x-ray that suggested left lower lobe atelectasis versus infiltrate. Notably, the patient recently had lumbar laminectomy that was performed 2 days prior to this admission. Neurosurgery was consulted from the emergency department due to possible concerns of postoperative infection. EKG shows sinus tachycardia with nonspecific ST-T wave changes. CT antrum of the chest was negative for embolus and showed bibasilar atelectasis. The patient was started on gemcitabine about intravenous fluids for presumptive pneumonia. On the morning of 07/18/15, the patient developed respiratory distress and oxygen desaturation and placed on BiPAP Assessment/Plan: Sepsis -Presented at the time of admission--secondary to HCAP -Continue intravenous antibiotics pending culture data -Continue intravenous fluids  -Lactic acid 1.4  -Procalcitonin 1.36--->3.80  -Influenza PCR negative  Acute respiratory failure with hypoxia -tolerating Rice this am x 3 hours--stable on 5L -secondary to pulmonary edema and pneumonia/aspiration -03/15/2016 CT angio chest negative for pulmonary embolus Acute on chronic diastolic CHF -pt received 30cc/kg fluid for sepsis and additional fluid for sepsis evening of 07/17/15 and fluids restarted on 07/18/15 -saline lock IVF -07/19/15 CXR with  increased vascular congestion when compared to 2/5 -restart lasix IV-->NEG 2.4L in past 24 hours -Echo--pending -daily weights--pt was up 17 pounds at time of admission compared to 06/28/15 Afib with RVR -new onset am 07/18/15 -continue diltiazem drip-->rate controlled -Neurosurgery input appreciated-->heparin ok -Appreciate cardiology -CHADSVASc = 5 -TSH--0.678 -07/18/15 EKG--afib HR 141, ST depression V3-V6 -07/17/15 troponin 0.55-->continue to cycle Elevated troponin -likely demand ischemia -defer to cardiology Acute encephalopathy  -multifactorial Secondary to infectious process, respiratory failure, opioids and new onset afib -07/17/2015 CT brain negative for acute process  -Urinalysis negative for pyuria  -07/20/15--much improved, near baseline per family at bedside Acute on chronic renal failure (CKD3) -secondary to sepsis -baseline creatinine 1.2-1.4 -improving with diuresis Hyponatremia -improving with diuresis Status post lumbar laminectomy  -Discussed with Dr. Kathyrn Sheriff (Neurosurgery) -doubt CNS infection Diabetes mellitus type 2 -NovoLog sliding scale -05/24/2015 hemoglobin A1c 7.6  Hypertension -metoprolol tartrate 12.5mg  bid Coronary artery disease  -Continue aspirin and plavix -05/12/2015 Myoview-- low risk  -pt had been off his plavix this past week due to prep for his back surgery Thrombocytopenia  -Chronic dating back to January 2013 -Monitor for signs of bleeding -Acute drop likely due to infectious process -improving with abx Rheumatoid arthritis  -On weekly methotrexate  -Continue maintenance prednisone  -stress steroid dose  Family Communication: Daughter and son updated at beside Disposition Plan: Home >3 days   Procedures/Studies: Ct Head Wo Contrast  07/17/2015  CLINICAL DATA:  pt awoke this morning "not acting right" has been experiencing acute confusion. Pt w/ back surgery x2 days ago, FEVER 102oriented to location, disoriented to  situation and time EXAM: CT HEAD WITHOUT CONTRAST TECHNIQUE: Contiguous axial images were obtained from the base of the skull through the vertex without intravenous contrast. COMPARISON:  None. FINDINGS: Severe diffuse age-related atrophy with  moderate low attenuation in the deep white matter. No evidence of vascular territory infarct or mass. No hemorrhage or extra-axial fluid. No hydrocephalus. The calvarium is intact. There is near complete opacification of the right maxillary sinus. Several bilateral ethmoid air cells are opacified or show inflammatory change particularly on the right. Several mastoid air cells on the right are opacified. IMPRESSION: Age-related involutional change. Significant inflammatory change involving the maxillary sinus, with inflammatory change also involving right mastoid air cells and ethmoid air cells. Electronically Signed   By: Skipper Cliche M.D.   On: 07/17/2015 19:55   Ct Angio Chest Pe W/cm &/or Wo Cm  07/17/2015  CLINICAL DATA:  Back surgery 3 days ago with severe chest pain and shortness of breath. EXAM: CT ANGIOGRAPHY CHEST WITH CONTRAST TECHNIQUE: Multidetector CT imaging of the chest was performed using the standard protocol during bolus administration of intravenous contrast. Multiplanar CT image reconstructions and MIPs were obtained to evaluate the vascular anatomy. CONTRAST:  156mL OMNIPAQUE IOHEXOL 350 MG/ML SOLN COMPARISON:  07/05/2011 FINDINGS: THORACIC INLET/BODY WALL: Bilateral thyroid nodules measuring up to 11 mm, likely stable from 2013 when allowing streak artifact on previous study. No acute finding MEDIASTINUM: Mild cardiomegaly. No pericardial effusion. There is mild to moderate aortic valve calcifications/sclerosis without visible progression since 2013. Diffuse atherosclerosis, including the coronary arteries. No evidence of acute aortic syndrome. CTA of the pulmonary arteries is limited by bolus dispersion and respiratory motion at the bases, primarily  affecting subsegmental vessels. There is no visible pulmonary embolism. Patulous esophagus. LUNG WINDOWS: Streaky opacities in the lower lungs with volume loss consistent with atelectasis. Intermittent airway mucus/debris, also seen on the previous study. Interlobular septal thickening at the apices compatible with early edema. Probable emphysema, with uncertainty related to respiratory motion. UPPER ABDOMEN: Cholelithiasis. OSSEOUS: T10 to lumbar posterior fixation. There are compression fractures of the T5, T7, and T8 vertebral bodies without acute/unhealed fracture line visible. Progressive and advanced adjacent segment disc degeneration above the fusion. Review of the MIP images confirms the above findings. IMPRESSION: 1. No evidence of pulmonary embolism. 2. Bibasilar atelectasis. 3. Early pulmonary edema. 4. Study degraded by respiratory motion. Electronically Signed   By: Monte Fantasia M.D.   On: 07/17/2015 21:37   Dg Chest Port 1 View  07/19/2015  CLINICAL DATA:  Respiratory distress EXAM: PORTABLE CHEST 1 VIEW COMPARISON:  07/18/2015 FINDINGS: Cardiomegaly with vascular congestion and diffuse interstitial prominence compatible with edema. Right PICC line is in place with the tip at the cavoatrial junction. Possible layering left effusion. No acute bony abnormality. IMPRESSION: Findings compatible with CHF.  Possible small left effusion. Electronically Signed   By: Rolm Baptise M.D.   On: 07/19/2015 13:40   Dg Chest Port 1 View  07/18/2015  CLINICAL DATA:  Line placement EXAM: PORTABLE CHEST 1 VIEW COMPARISON:  07/18/2015 FINDINGS: Right-sided PICC line has been placed, tip overlying the level of the superior vena cava. The heart is enlarged. There is pulmonary vascular congestion. Opacity at the medial left lung base is increased, consistent with developing infiltrate and/or atelectasis. IMPRESSION: 1. Right-sided PICC line tip to the superior vena cava. 2. Developing left lower lobe infiltrate and/or  atelectasis. Electronically Signed   By: Nolon Nations M.D.   On: 07/18/2015 14:04   Dg Chest Port 1 View  07/18/2015  CLINICAL DATA:  Shortness of breath. EXAM: PORTABLE CHEST 1 VIEW COMPARISON:  July 17, 2015. FINDINGS: Stable cardiomegaly. No pneumothorax or pleural effusion is noted. Stable mild bibasilar subsegmental  atelectasis is noted. Bony thorax is unremarkable. IMPRESSION: Stable mild bibasilar subsegmental atelectasis. Electronically Signed   By: Marijo Conception, M.D.   On: 07/18/2015 09:06   Dg Chest Port 1 View  07/17/2015  CLINICAL DATA:  Pt w/ back surgery x2 days ago, [facility] report temp of 102.0 on arrival and pt given 1gm APAP, pt also w/ SPO2 of 86% on room air, pt placed on simple mask at 6L/min and SPO2 up to 96%. Pt now under sepsis protocol and is experiencing acute confusion EXAM: PORTABLE CHEST 1 VIEW COMPARISON:  01/15/2015 FINDINGS: Heart is mildly enlarged. There are no focal consolidations or pleural effusions. There is mild left lower lobe subsegmental atelectasis or scarring. Less likely this could represent early infectious infiltrate. No evidence for pulmonary edema. Previous thoracic spine fusion. IMPRESSION: 1. Cardiomegaly without pulmonary edema. 2. Left lower lobe atelectasis versus early infiltrate. Followup is recommended. Electronically Signed   By: Nolon Nations M.D.   On: 07/17/2015 18:06         Subjective: Patient denies fevers, chills, headache, chest pain, dyspnea, nausea, vomiting, diarrhea, abdominal pain, dysuria, hematuria   Objective: Filed Vitals:   07/20/15 0600 07/20/15 0700 07/20/15 0800 07/20/15 0802  BP: 112/65 118/66 114/71   Pulse: 100 98 108   Temp:      TempSrc:      Resp: 22 17    Height:      Weight:      SpO2: 98% 99% 100% 98%    Intake/Output Summary (Last 24 hours) at 07/20/15 0932 Last data filed at 07/20/15 0515  Gross per 24 hour  Intake 486.38 ml  Output   2850 ml  Net -2363.62 ml   Weight change: -0.5  kg (-1 lb 1.6 oz) Exam:   General:  Pt is alert, follows commands appropriately, not in acute distress  HEENT: No icterus, No thrush, No neck mass, [place]/AT  Cardiovascular: RRR, S1/S2, no rubs, no gallops  Respiratory: bibasilar crackles, no wheeze  Abdomen: Soft/+BS, non tender, non distended, no guarding; no hepatosplenomegaly  Extremities: 1+LE edema, No lymphangitis, No petechiae, No rashes, no synovitis; no cyanosis  Data Reviewed: Basic Metabolic Panel:  Recent Labs Lab 07/17/15 1750 07/18/15 0242 07/19/15 0230 07/20/15 0400  NA 137 136 133* 138  K 4.1 3.9 4.5 3.6  CL 106 103 102 104  CO2 22 20* 20* 20*  GLUCOSE 74 65 242* 134*  BUN 18 16 20  21*  CREATININE 1.76* 1.45* 1.92* 1.62*  CALCIUM 8.1* 7.6* 7.1* 7.3*   Liver Function Tests:  Recent Labs Lab 07/17/15 1750  AST 42*  ALT 18  ALKPHOS 43  BILITOT 1.0  PROT 5.8*  ALBUMIN 2.9*   No results for input(s): LIPASE, AMYLASE in the last 168 hours. No results for input(s): AMMONIA in the last 168 hours. CBC:  Recent Labs Lab 07/17/15 1750 07/18/15 0242 07/19/15 0230 07/20/15 0400  WBC 8.5 9.0 8.6 6.4  NEUTROABS 5.4  --   --   --   HGB 10.1* 9.7* 8.5* 8.7*  HCT 31.3* 29.6* 25.0* 26.8*  MCV 98.4 99.7 97.3 96.1  PLT 156 134* 134* 158   Cardiac Enzymes:  Recent Labs Lab 07/17/15 1750 07/18/15 0909 07/18/15 1334  TROPONINI 0.55* 1.01* 2.06*   BNP: Invalid input(s): POCBNP CBG:  Recent Labs Lab 07/19/15 0834 07/19/15 1157 07/19/15 1743 07/19/15 2120 07/20/15 0817  GLUCAP 192* 144* 142* 134* 154*    Recent Results (from the past 240 hour(s))  Culture,  blood (routine x 2)     Status: None (Preliminary result)   Collection Time: 07/17/15  5:50 PM  Result Value Ref Range Status   Specimen Description BLOOD LEFT HAND  Final   Special Requests BOTTLES DRAWN AEROBIC AND ANAEROBIC 5CC  Final   Culture NO GROWTH 2 DAYS  Final   Report Status PENDING  Incomplete  Urine culture     Status:  None   Collection Time: 07/17/15  6:11 PM  Result Value Ref Range Status   Specimen Description URINE, CLEAN CATCH  Final   Special Requests NONE  Final   Culture MULTIPLE SPECIES PRESENT, SUGGEST RECOLLECTION  Final   Report Status 07/19/2015 FINAL  Final  Culture, blood (routine x 2)     Status: None (Preliminary result)   Collection Time: 07/17/15  6:15 PM  Result Value Ref Range Status   Specimen Description BLOOD RIGHT HAND  Final   Special Requests BOTTLES DRAWN AEROBIC AND ANAEROBIC 5CC  Final   Culture NO GROWTH 2 DAYS  Final   Report Status PENDING  Incomplete  MRSA PCR Screening     Status: None   Collection Time: 07/17/15 11:06 PM  Result Value Ref Range Status   MRSA by PCR NEGATIVE NEGATIVE Final    Comment:        The GeneXpert MRSA Assay (FDA approved for NASAL specimens only), is one component of a comprehensive MRSA colonization surveillance program. It is not intended to diagnose MRSA infection nor to guide or monitor treatment for MRSA infections.      Scheduled Meds: . antiseptic oral rinse  7 mL Mouth Rinse q12n4p  . aspirin EC  81 mg Oral Daily  . budesonide (PULMICORT) nebulizer solution  0.5 mg Nebulization BID  . ceFEPime (MAXIPIME) IV  2 g Intravenous Q24H  . chlorhexidine  15 mL Mouth Rinse BID  . cholecalciferol  1,000 Units Oral Daily  . folic acid  1 mg Oral Daily  . furosemide  40 mg Intravenous Daily  . insulin aspart  0-9 Units Subcutaneous TID WC  . metoprolol tartrate  12.5 mg Oral BID  . pantoprazole  40 mg Oral Daily  . pravastatin  20 mg Oral Daily  . sodium chloride flush  10-40 mL Intracatheter Q12H  . vancomycin  750 mg Intravenous Q12H   Continuous Infusions: . dextrose 20 mL/hr at 07/18/15 0726  . diltiazem (CARDIZEM) infusion 10 mg/hr (07/20/15 0436)     Reagen Goates, DO  [facility] Pager [phone]  If 7PM-7AM, please contact night-coverage [email or web address] Password [email or web address] 07/20/2015, 9:32 AM   LOS: 3 days

## 2015-07-20 NOTE — Progress Notes (Addendum)
Pharmacy Antibiotic Note  Curtis Frazier is a 75 y.o. male admitted on 07/17/2015 with sepsis.  Pharmacy has been consulted for vancomycin dosing and MD dosed cefepime.  Plan: Vancomycin 750mg  IV every 12 hours.  Goal trough 15-20 mcg/mL. Cefepime 2g IV q24h  Monitor culture data, renal function and clinical course VT before tonight's dose  Height: 5\' 8"  (172.7 cm) Weight: 204 lb 9.4 oz (92.8 kg) IBW/kg (Calculated) : 68.4  Temp (24hrs), Avg:99.2 F (37.3 C), Min:98.2 F (36.8 C), Max:100.5 F (38.1 C)   Recent Labs Lab 07/17/15 1750 07/17/15 1759 07/17/15 2022 07/17/15 2349 07/18/15 0242 07/18/15 0247 07/19/15 0230 07/20/15 0400  WBC 8.5  --   --   --  9.0  --  8.6 6.4  CREATININE 1.76*  --   --   --  1.45*  --  1.92* 1.62*  LATICACIDVEN  --  1.14 0.85 1.2  --  1.0  --   --     Estimated Creatinine Clearance: 43.6 mL/min (by C-G formula based on Cr of 1.62).    Allergies  Allergen Reactions  . Ace Inhibitors Other (See Comments)    Causes high K  . Angiotensin Receptor Blockers Other (See Comments)    Causes high K  . Metoprolol Other (See Comments)    May cause elevated K level  . Penicillins Swelling and Rash  . Tetracycline Swelling and Rash    Antimicrobials this admission: Cefepime 2/5>> Vanc 2/4 >> Ampicillin 2/4>>2/5 Ceftriaxone 2/4>>2/4  Dose adjustments this admission: n/a  Microbiology results: 2/4 MRSA PCR Nasal Neg 2/4 BCx2: ngtd 2/4 UCx: mult species  Andrey Cota. Diona Foley, PharmD, BCPS Clinical Pharmacist Pager [phone] 07/20/2015 10:39 AM

## 2015-07-20 NOTE — Progress Notes (Signed)
Speech Language Pathology Treatment: Dysphagia  Patient Details Name: Curtis Frazier MRN: [id number] DOB: 27-Dec-1940 Today's Date: 07/20/2015 Time: [id number] SLP Time Calculation (min) (ACUTE ONLY): 10 min  Assessment / Plan / Recommendation Clinical Impression  Pt again seen with trials of thin liquids with family at bedside. The pt again did not show evidence of aspiration, swallow was strong and timely. Pt is easily short of breath however, which increases risk of aspiration due to decreased tolerance for apneic period during the swallow. SLP discussed this risk with pt and family and reviewed important precautions to reduce risk. Son stated that he provided supervision at meals to cue his father to slow down and take rest breaks during meals for improved respiratory endurance, which SLP reinforced. Pt may continue PO intake. If clinical concern for aspiration increases, recommend MBS for objective assessment of swallowing    HPI HPI: Curtis Frazier is a 75 y.o. male with recent Lumbar Laminectomy 2 days ago, history of CAD, HTN, DM2, Hyperlipidemia, and RA who presents to the ED with complaints of confusion since awakening this AM, and Fever to 102. His family reports that he was not acting right. He has had non-productive cough, and SOB, and was found by EMS to have hypoxia to 86% and was placed on NCO2 and improved to 96%. He was also given tylenol for his fever by EMS on route. In the ED a Sepsis workup was initiatied, and he was placed on empiric antibiotics for possible CNS infection due to his recent surgery. HIs Chest X-ray however revealed possible early LLL pneumonia. He was referred for admission. Neurosurgery on call was consulted by the EDP .       SLP Plan  Discharge SLP treatment due to (comment)     Recommendations  Diet recommendations: Regular;Thin liquid Liquids provided via: Cup;Straw Medication Administration: Whole meds with liquid Supervision: Patient able to  self feed;Intermittent supervision to cue for compensatory strategies Compensations: Minimize environmental distractions;Slow rate;Small sips/bites Postural Changes and/or Swallow Maneuvers: Seated upright 90 degrees             Oral Care Recommendations: Oral care BID Plan: Discharge SLP treatment due to (comment)     GO               [name], MA CCC-SLP [phone]  Raun Routh, Katherene Ponto 07/20/2015, 11:12 AM

## 2015-07-21 ENCOUNTER — Inpatient Hospital Stay (HOSPITAL_COMMUNITY): Payer: Medicare Other

## 2015-07-21 ENCOUNTER — Encounter (HOSPITAL_COMMUNITY): Payer: Self-pay | Admitting: Internal Medicine

## 2015-07-21 DIAGNOSIS — J9600 Acute respiratory failure, unspecified whether with hypoxia or hypercapnia: Secondary | ICD-10-CM | POA: Insufficient documentation

## 2015-07-21 DIAGNOSIS — J189 Pneumonia, unspecified organism: Secondary | ICD-10-CM | POA: Insufficient documentation

## 2015-07-21 DIAGNOSIS — Z515 Encounter for palliative care: Secondary | ICD-10-CM

## 2015-07-21 DIAGNOSIS — J9601 Acute respiratory failure with hypoxia: Secondary | ICD-10-CM

## 2015-07-21 DIAGNOSIS — Z7189 Other specified counseling: Secondary | ICD-10-CM

## 2015-07-21 DIAGNOSIS — M069 Rheumatoid arthritis, unspecified: Secondary | ICD-10-CM

## 2015-07-21 LAB — BLOOD GAS, ARTERIAL
ACID-BASE DEFICIT: 3.6 mmol/L — AB (ref 0.0–2.0)
Bicarbonate: 19.4 mEq/L — ABNORMAL LOW (ref 20.0–24.0)
DELIVERY SYSTEMS: POSITIVE
Drawn by: 449561
EXPIRATORY PAP: 8
FIO2: 0.4
INSPIRATORY PAP: 14
O2 Saturation: 95.2 %
PCO2 ART: 26.6 mmHg — AB (ref 35.0–45.0)
PH ART: 7.476 — AB (ref 7.350–7.450)
Patient temperature: 98.6
TCO2: 20.2 mmol/L (ref 0–100)
pO2, Arterial: 77.4 mmHg — ABNORMAL LOW (ref 80.0–100.0)

## 2015-07-21 LAB — BASIC METABOLIC PANEL
Anion gap: 12 (ref 5–15)
BUN: 17 mg/dL (ref 6–20)
CHLORIDE: 102 mmol/L (ref 101–111)
CO2: 22 mmol/L (ref 22–32)
CREATININE: 1.4 mg/dL — AB (ref 0.61–1.24)
Calcium: 8 mg/dL — ABNORMAL LOW (ref 8.9–10.3)
GFR, EST AFRICAN AMERICAN: 55 mL/min — AB (ref >60)
GFR, EST NON AFRICAN AMERICAN: 48 mL/min — AB (ref >60)
Glucose, Bld: 210 mg/dL — ABNORMAL HIGH (ref 65–99)
POTASSIUM: 3.6 mmol/L (ref 3.5–5.1)
SODIUM: 136 mmol/L (ref 135–145)

## 2015-07-21 LAB — PROCALCITONIN: Procalcitonin: 1.93 ng/mL

## 2015-07-21 LAB — POCT I-STAT 3, ART BLOOD GAS (G3+)
ACID-BASE DEFICIT: 4 mmol/L — AB (ref 0.0–2.0)
BICARBONATE: 20.5 meq/L (ref 20.0–24.0)
O2 Saturation: 97 %
PCO2 ART: 36.4 mmHg (ref 35.0–45.0)
PO2 ART: 94 mmHg (ref 80.0–100.0)
Patient temperature: 98.6
TCO2: 22 mmol/L (ref 0–100)
pH, Arterial: 7.359 (ref 7.350–7.450)

## 2015-07-21 LAB — TRIGLYCERIDES: Triglycerides: 127 mg/dL (ref <150)

## 2015-07-21 LAB — CBC
HCT: 27.4 % — ABNORMAL LOW (ref 39.0–52.0)
Hemoglobin: 9.1 g/dL — ABNORMAL LOW (ref 13.0–17.0)
MCH: 31.3 pg (ref 26.0–34.0)
MCHC: 33.2 g/dL (ref 30.0–36.0)
MCV: 94.2 fL (ref 78.0–100.0)
PLATELETS: 176 10*3/uL (ref 150–400)
RBC: 2.91 MIL/uL — AB (ref 4.22–5.81)
RDW: 16.1 % — ABNORMAL HIGH (ref 11.5–15.5)
WBC: 7.3 10*3/uL (ref 4.0–10.5)

## 2015-07-21 LAB — GLUCOSE, CAPILLARY
GLUCOSE-CAPILLARY: 221 mg/dL — AB (ref 65–99)
Glucose-Capillary: 209 mg/dL — ABNORMAL HIGH (ref 65–99)
Glucose-Capillary: 248 mg/dL — ABNORMAL HIGH (ref 65–99)
Glucose-Capillary: 335 mg/dL — ABNORMAL HIGH (ref 65–99)
Glucose-Capillary: 267 mg/dL — ABNORMAL HIGH (ref 65–99)

## 2015-07-21 LAB — BODY FLUID CELL COUNT WITH DIFFERENTIAL
Eos, Fluid: 0 %
Lymphs, Fluid: 17 %
Monocyte-Macrophage-Serous Fluid: 50 % (ref 50–90)
NEUTROPHIL FLUID: 33 % — AB (ref 0–25)
WBC FLUID: 118 uL (ref 0–1000)

## 2015-07-21 LAB — HEPARIN LEVEL (UNFRACTIONATED)
HEPARIN UNFRACTIONATED: 0.27 [IU]/mL — AB (ref 0.30–0.70)
Heparin Unfractionated: 0.31 IU/mL (ref 0.30–0.70)

## 2015-07-21 LAB — LACTIC ACID, PLASMA: LACTIC ACID, VENOUS: 1.2 mmol/L (ref 0.5–2.0)

## 2015-07-21 MED ORDER — AMIODARONE LOAD VIA INFUSION
150.0000 mg | Freq: Once | INTRAVENOUS | Status: AC
  Administered 2015-07-21: 150 mg via INTRAVENOUS
  Filled 2015-07-21: qty 83.34

## 2015-07-21 MED ORDER — MORPHINE SULFATE (PF) 2 MG/ML IV SOLN
1.0000 mg | INTRAVENOUS | Status: DC | PRN
  Administered 2015-07-21: 1 mg via INTRAVENOUS
  Filled 2015-07-21: qty 1

## 2015-07-21 MED ORDER — ANTISEPTIC ORAL RINSE SOLUTION (CORINZ)
7.0000 mL | Freq: Four times a day (QID) | OROMUCOSAL | Status: DC
  Administered 2015-07-21 – 2015-08-03 (×53): 7 mL via OROMUCOSAL

## 2015-07-21 MED ORDER — ETOMIDATE 2 MG/ML IV SOLN
20.0000 mg | Freq: Once | INTRAVENOUS | Status: AC
  Administered 2015-07-21: 20 mg via INTRAVENOUS

## 2015-07-21 MED ORDER — PANTOPRAZOLE SODIUM 40 MG IV SOLR
40.0000 mg | Freq: Every day | INTRAVENOUS | Status: DC
  Administered 2015-07-21 – 2015-07-26 (×6): 40 mg via INTRAVENOUS
  Filled 2015-07-21 (×7): qty 40

## 2015-07-21 MED ORDER — SODIUM CHLORIDE 0.9 % IV SOLN
INTRAVENOUS | Status: DC
  Administered 2015-07-21 – 2015-07-22 (×2): 75 mL/h via INTRAVENOUS
  Administered 2015-07-23 – 2015-07-24 (×3): via INTRAVENOUS

## 2015-07-21 MED ORDER — FENTANYL CITRATE (PF) 100 MCG/2ML IJ SOLN
50.0000 ug | INTRAMUSCULAR | Status: AC | PRN
  Administered 2015-07-22 – 2015-07-23 (×3): 50 ug via INTRAVENOUS
  Filled 2015-07-21 (×3): qty 2

## 2015-07-21 MED ORDER — ROCURONIUM BROMIDE 50 MG/5ML IV SOLN
60.0000 mg | Freq: Once | INTRAVENOUS | Status: AC
  Administered 2015-07-21: 60 mg via INTRAVENOUS

## 2015-07-21 MED ORDER — HYDROCORTISONE NA SUCCINATE PF 100 MG IJ SOLR
50.0000 mg | Freq: Four times a day (QID) | INTRAMUSCULAR | Status: DC
Start: 2015-07-21 — End: 2015-07-24
  Administered 2015-07-21 – 2015-07-24 (×13): 50 mg via INTRAVENOUS
  Filled 2015-07-21 (×3): qty 1
  Filled 2015-07-21: qty 2
  Filled 2015-07-21 (×4): qty 1
  Filled 2015-07-21: qty 2
  Filled 2015-07-21 (×4): qty 1
  Filled 2015-07-21: qty 2
  Filled 2015-07-21: qty 1
  Filled 2015-07-21: qty 2

## 2015-07-21 MED ORDER — FENTANYL CITRATE (PF) 100 MCG/2ML IJ SOLN
50.0000 ug | INTRAMUSCULAR | Status: DC | PRN
  Administered 2015-07-23 – 2015-07-31 (×12): 50 ug via INTRAVENOUS
  Filled 2015-07-21 (×12): qty 2

## 2015-07-21 MED ORDER — AMIODARONE HCL IN DEXTROSE 360-4.14 MG/200ML-% IV SOLN
60.0000 mg/h | INTRAVENOUS | Status: DC
  Administered 2015-07-21 (×2): 60 mg/h via INTRAVENOUS

## 2015-07-21 MED ORDER — INSULIN ASPART 100 UNIT/ML ~~LOC~~ SOLN
0.0000 [IU] | SUBCUTANEOUS | Status: DC
  Administered 2015-07-21 (×2): 3 [IU] via SUBCUTANEOUS
  Administered 2015-07-21: 7 [IU] via SUBCUTANEOUS
  Administered 2015-07-21: 5 [IU] via SUBCUTANEOUS
  Administered 2015-07-22: 3 [IU] via SUBCUTANEOUS

## 2015-07-21 MED ORDER — DOCUSATE SODIUM 50 MG/5ML PO LIQD
100.0000 mg | Freq: Two times a day (BID) | ORAL | Status: DC | PRN
  Filled 2015-07-21: qty 10

## 2015-07-21 MED ORDER — SODIUM CHLORIDE 0.9 % IV BOLUS (SEPSIS)
1000.0000 mL | Freq: Once | INTRAVENOUS | Status: AC
  Administered 2015-07-21: 1000 mL via INTRAVENOUS

## 2015-07-21 MED ORDER — GADOBENATE DIMEGLUMINE 529 MG/ML IV SOLN
20.0000 mL | Freq: Once | INTRAVENOUS | Status: AC | PRN
  Administered 2015-07-21: 20 mL via INTRAVENOUS

## 2015-07-21 MED ORDER — CHLORHEXIDINE GLUCONATE 0.12% ORAL RINSE (MEDLINE KIT)
15.0000 mL | Freq: Two times a day (BID) | OROMUCOSAL | Status: DC
  Administered 2015-07-21 – 2015-08-03 (×26): 15 mL via OROMUCOSAL

## 2015-07-21 MED ORDER — FENTANYL CITRATE (PF) 100 MCG/2ML IJ SOLN
INTRAMUSCULAR | Status: AC
  Administered 2015-07-21: 100 ug
  Filled 2015-07-21: qty 2

## 2015-07-21 MED ORDER — AMIODARONE HCL IN DEXTROSE 360-4.14 MG/200ML-% IV SOLN
30.0000 mg/h | INTRAVENOUS | Status: DC
  Filled 2015-07-21: qty 200

## 2015-07-21 MED ORDER — PROPOFOL 1000 MG/100ML IV EMUL
INTRAVENOUS | Status: AC
  Administered 2015-07-21: 10 ug/kg/min
  Filled 2015-07-21: qty 100

## 2015-07-21 MED ORDER — LEVALBUTEROL HCL 0.63 MG/3ML IN NEBU
0.6300 mg | INHALATION_SOLUTION | Freq: Four times a day (QID) | RESPIRATORY_TRACT | Status: DC
Start: 2015-07-21 — End: 2015-08-03
  Administered 2015-07-21 – 2015-08-03 (×52): 0.63 mg via RESPIRATORY_TRACT
  Filled 2015-07-21 (×84): qty 3

## 2015-07-21 MED ORDER — OXYCODONE HCL 5 MG PO TABS: 5.0000 mg | ORAL_TABLET | Freq: Four times a day (QID) | ORAL | Status: DC | PRN

## 2015-07-21 MED ORDER — AMIODARONE HCL IN DEXTROSE 360-4.14 MG/200ML-% IV SOLN
INTRAVENOUS | Status: AC
  Administered 2015-07-21: 60 mg/h via INTRAVENOUS
  Filled 2015-07-21: qty 200

## 2015-07-21 MED ORDER — PHENYLEPHRINE HCL 10 MG/ML IJ SOLN
30.0000 ug/min | INTRAMUSCULAR | Status: DC
  Administered 2015-07-21: 125 ug/min via INTRAVENOUS
  Administered 2015-07-21: 150 ug/min via INTRAVENOUS
  Administered 2015-07-21: 30 ug/min via INTRAVENOUS
  Filled 2015-07-21 (×2): qty 1

## 2015-07-21 MED ORDER — PROPOFOL 1000 MG/100ML IV EMUL
0.0000 ug/kg/min | INTRAVENOUS | Status: DC
  Administered 2015-07-21 (×2): 30 ug/kg/min via INTRAVENOUS
  Administered 2015-07-22: 40 ug/kg/min via INTRAVENOUS
  Administered 2015-07-22 (×2): 30 ug/kg/min via INTRAVENOUS
  Administered 2015-07-22: 20 ug/kg/min via INTRAVENOUS
  Administered 2015-07-22: 30 ug/kg/min via INTRAVENOUS
  Administered 2015-07-22: 20 ug/kg/min via INTRAVENOUS
  Administered 2015-07-23 (×5): 40 ug/kg/min via INTRAVENOUS
  Administered 2015-07-24: 49.982 ug/kg/min via INTRAVENOUS
  Administered 2015-07-24 (×2): 50 ug/kg/min via INTRAVENOUS
  Administered 2015-07-24: 40 ug/kg/min via INTRAVENOUS
  Administered 2015-07-24 – 2015-07-25 (×3): 50 ug/kg/min via INTRAVENOUS
  Administered 2015-07-25: 45 ug/kg/min via INTRAVENOUS
  Administered 2015-07-25: 50 ug/kg/min via INTRAVENOUS
  Administered 2015-07-26 (×2): 15 ug/kg/min via INTRAVENOUS
  Administered 2015-07-26 – 2015-07-27 (×2): 20 ug/kg/min via INTRAVENOUS
  Administered 2015-07-28: 15 ug/kg/min via INTRAVENOUS
  Filled 2015-07-21 (×30): qty 100

## 2015-07-21 MED ORDER — FOLIC ACID 1 MG PO TABS
1.0000 mg | ORAL_TABLET | Freq: Every day | ORAL | Status: DC
Start: 2015-07-22 — End: 2015-08-03
  Administered 2015-07-22 – 2015-08-03 (×12): 1 mg
  Filled 2015-07-21 (×13): qty 1

## 2015-07-21 MED ORDER — PHENYLEPHRINE HCL 10 MG/ML IJ SOLN
30.0000 ug/min | INTRAMUSCULAR | Status: DC
  Administered 2015-07-21: 130 ug/min via INTRAVENOUS
  Administered 2015-07-21: 125 ug/min via INTRAVENOUS
  Administered 2015-07-22 (×2): 40 ug/min via INTRAVENOUS
  Administered 2015-07-23: 20 ug/min via INTRAVENOUS
  Administered 2015-07-26: 70 ug/min via INTRAVENOUS
  Administered 2015-07-26: 30 ug/min via INTRAVENOUS
  Filled 2015-07-21 (×10): qty 4

## 2015-07-21 MED ORDER — MIDAZOLAM HCL 2 MG/2ML IJ SOLN
INTRAMUSCULAR | Status: AC
  Administered 2015-07-21: 2 mg
  Filled 2015-07-21: qty 4

## 2015-07-21 MED ORDER — FUROSEMIDE 10 MG/ML IJ SOLN
80.0000 mg | Freq: Every day | INTRAMUSCULAR | Status: DC
Start: 2015-07-21 — End: 2015-07-21
  Administered 2015-07-21: 80 mg via INTRAVENOUS

## 2015-07-21 NOTE — Progress Notes (Signed)
Paged Rapid Response bc pt having increase WOB and increasing respirations.  Advised to give pt Dilaudid to aide in tolerating BiPAP and return call to Rapid if no improvements.  Will continue to monitor pt.

## 2015-07-21 NOTE — Progress Notes (Signed)
SBP [address] [place] notified orders received Normal Saline 1 liter bolus given

## 2015-07-21 NOTE — Significant Event (Signed)
Rapid Response Event Note Called by primary RN to see pt for increased WOB & increased O2 demands. On Bipap currently Overview: Time Called: 0630 Arrival Time: 0632 Event Type: Respiratory  Initial Focused Assessment: Pt is on Bipap with RR 40s & increased WOB with accessory muscle use, BBS CTA, Sats 98% on 40% FiO2.  Pt has had Ativan and Dilaudid earlier without improvement.  HR 120s, BP up, T=100.0 axillary.  Will obtain ABG. Rectal t=102.8  Interventions: ABG  Event Summary: Lurlean Leyden, NP paged at [phone]   at      at          [facility], [address] [place]

## 2015-07-21 NOTE — Procedures (Signed)
Intubation Procedure Note OZMAR GEIMER [id number] [birth year]  Procedure: Intubation Indications: Respiratory insufficiency  Procedure Details Consent: Risks of procedure as well as the alternatives and risks of each were explained to the (patient/caregiver).  Consent for procedure obtained. Time Out: Verified patient identification, verified procedure, site/side was marked, verified correct patient position, special equipment/implants available, medications/allergies/relevent history reviewed, required imaging and test results available.  Performed  Maximum sterile technique was used including antiseptics, gloves and gown. And hand hygiene  MAC and 4  4 glide scope  #8 ETT    Evaluation Hemodynamic Status: BP stable throughout; O2 sats: stable throughout Patient's Current Condition: stable Complications: No apparent complications Patient did tolerate procedure well. Chest X-ray ordered to verify placement.  CXR: pending.   Curtis Frazier 07/21/2015

## 2015-07-21 NOTE — Progress Notes (Signed)
ANTICOAGULATION CONSULT NOTE  Pharmacy Consult for heparin Indication: atrial fibrillation  Patient Measurements: Height: 5\' 8"  (172.7 cm) Weight: 204 lb 5.9 oz (92.7 kg) IBW/kg (Calculated) : 68.4 Heparin Dosing Weight: 87.8 kg  Vital Signs: Temp: 103.2 F (39.6 C) (02/08 0935) Temp Source: Axillary (02/08 0935) BP: 94/61 mmHg (02/08 1400) Pulse Rate: 98 (02/08 1400)  Labs:  Recent Labs  07/19/15 0230 07/20/15 0400 07/20/15 2000 07/21/15 0330 07/21/15 1030  HGB 8.5* 8.7*  --  9.1*  --   HCT 25.0* 26.8*  --  27.4*  --   PLT 134* 158  --  176  --   HEPARINUNFRC  --   --  0.17* 0.31 0.27*  CREATININE 1.92* 1.62*  --  1.40*  --     Estimated Creatinine Clearance: 50.4 mL/min (by C-G formula based on Cr of 1.4).   Medical History: Past Medical History  Diagnosis Date  . Other and unspecified hyperlipidemia   . Coronary atherosclerosis of unspecified type of vessel, native or graft   . Personal history of colonic polyps   . Actinic keratosis   . 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 Other psoriasis   . Scoliosis (and kyphoscoliosis), idiopathic   . GI bleed 12/10    cecam AVM  . Gastritis 12/10  . Hyperlipidemia     takes Pravastatin daily  . Hypertension     takes Metoprolol daily  . Myocardial infarction ([place]) 2010  . Shortness of breath     with exertion  . Pneumonia     hx of-2008  . Headache(784.0)     occasionally  . Joint pain   . Joint swelling   . Rheumatoid arthritis(714.0)     takes Methotrexate 7pills weekly  . Degeneration of intervertebral disc, site unspecified   . Chronic back pain     scoliosis/stenosis/radiculopathy,degenerative disc disease  . Psoriasis   . Bruises easily   . Esophageal reflux     takes Omeprazole daily  . History of colonic polyps   . Hemorrhoids   . Kidney stone     hx of  . Blood transfusion 2010  . Type II or unspecified type diabetes mellitus without mention of complication, not stated as uncontrolled     type  II;controlled by diet and exercise  . PMR (polymyalgia rheumatica) ([facility]) 01/20/2012    tx by specialist on a long prednisone taper  . S/P PTCA (percutaneous transluminal coronary angioplasty)     Medications:  Scheduled:  . antiseptic oral rinse  7 mL Mouth Rinse QID  . ceFEPime (MAXIPIME) IV  2 g Intravenous Q24H  . chlorhexidine gluconate  15 mL Mouth Rinse BID  . [START ON [id number] folic acid  1 mg Per Tube Daily  . hydrocortisone sod succinate (SOLU-CORTEF) inj  50 mg Intravenous Q6H  . insulin aspart  0-9 Units Subcutaneous 6 times per day  . levalbuterol  0.63 mg Nebulization Q6H  . pantoprazole (PROTONIX) IV  40 mg Intravenous Daily  . sodium chloride flush  10-40 mL Intracatheter Q12H  . vancomycin  750 mg Intravenous Q12H   Infusions:  . sodium chloride 75 mL/hr (07/21/15 1406)  . heparin 1,700 Units/hr (07/21/15 1100)  . phenylephrine (NEO-SYNEPHRINE) Adult infusion    . propofol (DIPRIVAN) infusion 30 mcg/kg/min (07/21/15 1400)   PRN: docusate, fentaNYL (SUBLIMAZE) injection, fentaNYL (SUBLIMAZE) injection, sodium chloride flush  Assessment: 75 yo m that presented on 2/4 with AMS.  Patient developed new onset Afib with RVR on 07/18/15.  Pharmacy is consulted to dose heparin for afib. Pt's plavix has been stopped and starting heparin was ok'ed by neurosurg. No anticoagulation PTA. CHADSVASc = 5. Hgb 9.1, Plts 176.   HL slightly low at 0.27 on 1700 units/hr.  No issues per RN.  Goal of Therapy:  Heparin level 0.3-0.7 units/ml Monitor platelets by anticoagulation protocol: Yes   Plan:  Increase heparin infusion to 1900 units/hr F/u 0100 HL Daily HL, CBC Monitor s/s of bleeding   Cassie L. Nicole Kindred, PharmD PGY2 Infectious Diseases Pharmacy Resident Pager: [phone] 07/21/2015 3:57 PM

## 2015-07-21 NOTE — Consult Note (Signed)
Consultation Note Date: 07/21/2015   Patient Name: Curtis Frazier  DOB: 1940/07/13  MRN: [id number]  Age / Sex: 75 y.o., male  PCP: Abner Greenspan, MD Referring Physician: Modena Jansky, MD  Reason for Consultation: Establishing goals of care and Psychosocial/spiritual support   Clinical Assessment/Narrative:  Mr. Petitte of CAD, HTN, DM2, Hyperlipidemia, and RA, had a posterior lumbar fusion on 07/15/2015 and was discharged home thereafterwards. Unfortunately he was readmitted on 07/17/2015 with confusion, shortness of breath and hypoxemia. He was found to have A. fib with RVR. History to with IV fluids antibiotics. Initially he had some improvement by 07/20/2015 but overnight on February 7 and through February 8 early morning he developed worsening hypoxemia and confusion. He was placed on BiPAP. Continued to decline and required intubation.  This NP Wadie Lessen reviewed medical records, received report from team, assessed the patient and then meet at the patient's bedside along with his wife and five children Ruby Cola, Alvester Chou)   to discuss diagnosis, prognosis, GOC.     A  discussion was had today regarding importance of  advanced directives discussion.     Values and goals of care important to patient and family were attempted to be elicited.  Concept of Palliative Care was discussed.  Emotional support offered to family, all are hopeful for improvement.     Questions and concerns addressed.  Family encouraged to call with questions or concerns.  PMT will continue to support holistically.   Contacts/Participants in Discussion: Primary Decision Maker: Family as a whole    HCPOA: none documented    SUMMARY OF RECOMMENDATIONS  -Continue to treat the treatable, family is hopeful for return to baseline  Code Status/Advance Care Planning: Full code    Code Status Orders        Start      Ordered   07/17/15 2257  Full code   Continuous     07/17/15 2257    Code Status History    Date Active Date Inactive Code Status Order ID Comments User Context   This patient has a current code status but no historical code status.      Other Directives:None    Psycho-social/Spiritual:  Support System: Strong Desire for further Chaplaincy support:yes  Prognosis: Unable to determine  Discharge Planning: Pending outcomes   Chief Complaint/ Primary Diagnoses: Present on Admission:  . Sepsis ([place]) . Acute encephalopathy . LLL pneumonia . Rheumatoid arthritis ([place]) . Coronary atherosclerosis . Essential hypertension, benign . Elevated troponin  I have reviewed the medical record, interviewed the patient and family, and examined the patient. The following aspects are pertinent.  Past Medical History  Diagnosis Date  . Other and unspecified hyperlipidemia   . Coronary atherosclerosis of unspecified type of vessel, native or graft   . Personal history of colonic polyps   . Actinic keratosis   . Psoriatic arthropathy ([place])   . Other psoriasis   . Scoliosis (and kyphoscoliosis), idiopathic   . GI bleed 12/10    cecam AVM  . Gastritis 12/10  . Hyperlipidemia     takes Pravastatin daily  . Hypertension     takes Metoprolol daily  . Myocardial infarction ([place]) 2010  . Shortness of breath     with exertion  . Pneumonia     hx of-2008  . Headache(784.0)     occasionally  . Joint pain   . Joint swelling   . Rheumatoid arthritis(714.0)     takes Methotrexate 7pills  weekly  . Degeneration of intervertebral disc, site unspecified   . Chronic back pain     scoliosis/stenosis/radiculopathy,degenerative disc disease  . Psoriasis   . Bruises easily   . Esophageal reflux     takes Omeprazole daily  . History of colonic polyps   . Hemorrhoids   . Kidney stone     hx of  . Blood transfusion 2010  . Type II or unspecified type diabetes mellitus without mention of  complication, not stated as uncontrolled     type II;controlled by diet and exercise  . PMR (polymyalgia rheumatica) (HCC) 01/20/2012    tx by specialist on a long prednisone taper  . S/P PTCA (percutaneous transluminal coronary angioplasty)    Social History   Social History  . Marital Status: Married    Spouse Name: N/A  . Number of Children: N/A  . Years of Education: N/A   Social History Main Topics  . Smoking status: Former Research scientist (life sciences)  . Smokeless tobacco: Never Used     Comment: quit 2010  . Alcohol Use: 0.0 oz/week    0 Standard drinks or equivalent per week     Comment: occasional  . Drug Use: No  . Sexual Activity: No   Other Topics Concern  . None   Social History Narrative   Family History  Problem Relation Age of Onset  . Coronary artery disease Mother   . Diabetes Mother   . CAD Mother   . Coronary artery disease Sister   . Hypertension Sister   . Kidney failure Sister   . CAD Sister   . Diabetes Brother   . Prostate cancer Brother   . Pancreatic cancer Brother   . Diabetes Brother   . Anesthesia problems Neg Hx   . Hypotension Neg Hx   . Malignant hyperthermia Neg Hx   . Pseudochol deficiency Neg Hx    Scheduled Meds: . antiseptic oral rinse  7 mL Mouth Rinse QID  . ceFEPime (MAXIPIME) IV  2 g Intravenous Q24H  . chlorhexidine gluconate  15 mL Mouth Rinse BID  . [START ON [id number] folic acid  1 mg Per Tube Daily  . hydrocortisone sod succinate (SOLU-CORTEF) inj  50 mg Intravenous Q6H  . insulin aspart  0-9 Units Subcutaneous 6 times per day  . levalbuterol  0.63 mg Nebulization Q6H  . pantoprazole (PROTONIX) IV  40 mg Intravenous Daily  . sodium chloride flush  10-40 mL Intracatheter Q12H  . vancomycin  750 mg Intravenous Q12H   Continuous Infusions: . heparin 1,700 Units/hr (07/21/15 1100)  . phenylephrine (NEO-SYNEPHRINE) Adult infusion 30 mcg/min (07/21/15 1213)  . propofol (DIPRIVAN) infusion 20 mcg/kg/min (07/21/15 1100)   PRN  Meds:.docusate, fentaNYL (SUBLIMAZE) injection, fentaNYL (SUBLIMAZE) injection, sodium chloride flush Medications Prior to Admission:  Prior to Admission medications   Medication Sig Start Date End Date Taking? Authorizing Provider  aspirin EC 81 MG tablet Take 81 mg by mouth daily.   Yes Historical Provider, MD  Calcium Carbonate-Vit D-Min (CALCIUM 1200 PO) Take 1 tablet by mouth daily.    Yes Historical Provider, MD  Cholecalciferol 1000 UNITS capsule Take 1,000 Units by mouth daily.     Yes Historical Provider, MD  clopidogrel (PLAVIX) 75 MG tablet TAKE 1 TABLET BY MOUTH EVERY DAY 06/08/15  Yes Abner Greenspan, MD  cyclobenzaprine (FLEXERIL) 10 MG tablet Take 10 mg by mouth 3 (three) times daily as needed for muscle spasms.   Yes Historical Provider, MD  fish oil-omega-3 fatty  acids 1000 MG capsule Take 1 g by mouth daily.    Yes Historical Provider, MD  folic acid (FOLVITE) 1 MG tablet Take 1 mg by mouth daily.  05/18/15  Yes Historical Provider, MD  glipiZIDE (GLUCOTROL XL) 5 MG 24 hr tablet TAKE 1 TABLET BY MOUTH EVERY DAY WITH BREAKFAST 08/24/14  Yes Marne A Tower, MD  InFLIXimab (REMICADE IV) Inject 100 mg into the vein See admin instructions. Takes every 2 months   Yes Historical Provider, MD  methotrexate (RHEUMATREX) 2.5 MG tablet Take by mouth once a week. 4 TABS WEEKLY ./CY   Yes Historical Provider, MD  metoprolol succinate (TOPROL-XL) 25 MG 24 hr tablet TAKE 1 TABLET (25 MG TOTAL) BY MOUTH DAILY. 04/07/15  Yes Abner Greenspan, MD  nitroGLYCERIN (NITROSTAT) 0.4 MG SL tablet Place 1 tablet (0.4 mg total) under the tongue every 5 (five) minutes as needed for chest pain (x 3 doses). 05/19/15  Yes Eileen Stanford, PA-C  omeprazole (PRILOSEC) 20 MG capsule Take 20 mg by mouth daily.  05/18/15  Yes Historical Provider, MD  oxyCODONE-acetaminophen (PERCOCET) 10-325 MG tablet Take 0.5-1 tablets by mouth every 6 (six) hours as needed for pain.  07/15/15  Yes Historical Provider, MD  pravastatin  (PRAVACHOL) 20 MG tablet TAKE 1 TABLET BY MOUTH DAILY 04/27/15  Yes Abner Greenspan, MD  predniSONE (DELTASONE) 5 MG tablet Take 5 mg by mouth daily.  04/29/14  Yes Historical Provider, MD  VOLTAREN 1 % GEL Takes 1 application topically daily as needed for pain 05/08/15  Yes Historical Provider, MD   Allergies  Allergen Reactions  . Ace Inhibitors Other (See Comments)    Causes high K  . Angiotensin Receptor Blockers Other (See Comments)    Causes high K  . Metoprolol Other (See Comments)    May cause elevated K level  . Penicillins Swelling and Rash  . Tetracycline Swelling and Rash    Review of Systems  Unable to perform ROS   Physical Exam  Constitutional: He appears well-developed. He appears ill. He is intubated.  Respiratory: He is intubated.  Skin: Skin is warm and dry.    Vital Signs: BP 75/54 mmHg  Pulse 98  Temp(Src) 103.2 F (39.6 C) (Axillary)  Resp 28  Ht 5\' 8"  (1.727 m)  Wt 92.7 kg (204 lb 5.9 oz)  BMI 31.08 kg/m2  SpO2 100%  SpO2: SpO2: 100 % O2 Device:SpO2: 100 % O2 Flow Rate: .O2 Flow Rate (L/min): 15 L/min  IO: Intake/output summary:  Intake/Output Summary (Last 24 hours) at 07/21/15 1254 Last data filed at 07/21/15 1213  Gross per 24 hour  Intake 1923.68 ml  Output   2242 ml  Net -318.32 ml    LBM: Last BM Date: 07/21/15 Baseline Weight: Weight: 83.915 kg (185 lb) Most recent weight: Weight: 92.7 kg (204 lb 5.9 oz)      Palliative Assessment/Data:  Flowsheet Rows        Most Recent Value   Intake Tab    Referral Department  Hospitalist   Unit at Time of Referral  Intermediate Care Unit   Palliative Care Primary Diagnosis  Cardiac   Date Notified  07/19/15   Palliative Care Type  New Palliative care   Reason for referral  Clarify Goals of Care   Date of Admission  07/17/15   # of days IP prior to Palliative referral  2   Clinical Assessment    Psychosocial & Spiritual Assessment    Palliative  Care Outcomes       Additional Data  Reviewed:  CBC:    Component Value Date/Time   WBC 7.3 07/21/2015 0330   HGB 9.1* 07/21/2015 0330   HCT 27.4* 07/21/2015 0330   PLT 176 07/21/2015 0330   MCV 94.2 07/21/2015 0330   NEUTROABS 5.4 07/17/2015 1750   LYMPHSABS 1.3 07/17/2015 1750   MONOABS 1.6* 07/17/2015 1750   EOSABS 0.0 07/17/2015 1750   BASOSABS 0.2* 07/17/2015 1750   Comprehensive Metabolic Panel:    Component Value Date/Time   NA 136 07/21/2015 0330   K 3.6 07/21/2015 0330   CL 102 07/21/2015 0330   CO2 22 07/21/2015 0330   BUN 17 07/21/2015 0330   CREATININE 1.40* 07/21/2015 0330   GLUCOSE 210* 07/21/2015 0330   CALCIUM 8.0* 07/21/2015 0330   AST 42* 07/17/2015 1750   ALT 18 07/17/2015 1750   ALKPHOS 43 07/17/2015 1750   BILITOT 1.0 07/17/2015 1750   PROT 5.8* 07/17/2015 1750   ALBUMIN 2.9* 07/17/2015 1750   Discussed with Dr Exie Parody   Time In: 1015 Time Out: 1130 Time Total: 75 min Greater than 50%  of this time was spent counseling and coordinating care related to the above assessment and plan.  Signed by: Wadie Lessen, NP  Knox Royalty, NP  07/21/2015, 12:54 PM  Please contact Palliative Medicine Team phone at [phone] for questions and concerns.

## 2015-07-21 NOTE — Progress Notes (Signed)
PROGRESS NOTE    BRODRICK FIELDHOUSE [id number] DOB: 1941-01-10 DOA: 07/17/2015 PCP: Loura Pardon, MD  HPI/Brief narrative 75 year old male patient with history of CAD status post and, HTN, HLD, DM 2, RA, posterior lumbar fusion on 07/15/15, discharged home and readmitted on 07/17/15 for complaints of confusion, high fever/102F, nonproductive cough, dyspnea and hypoxia/86%. Patient was admitted to stepdown unit for sepsis secondary to healthcare associated pneumonia, A. fib with RVR, acute on chronic diastolic CHF secondary to pneumonia and RVR. Patient's respiratory status has been tenous, on BiPAP intermittently. Overnight 2/7, dyspnea progressively got worse. Cardiology and neurosurgery following. CCM consulted on 2/8 and patient transferred to ICU for close monitoring and management.  Assessment/Plan:   1. Sepsis: Present on admission. Secondary to healthcare associated pneumonia. Neurosurgery did not feel that he had postoperative site infection. Treated for sepsis protocol with aggressive IV fluids and antibiotics. Influenza PCR negative. Lactate normalized. Sputum culture collected 2/8. Pro-calcitonin: 1.93. Blood cultures 2: Negative to date. Urine culture: Suggestive of contamination. Continue IV cefepime and vancomycin. 2. Acute respiratory failure with hypoxia: Secondary to HCAP, acute on chronic diastolic CHF. Treat underlying cause. Did not tolerate BiPAP last night secondary to confusion and Pulling at it. CCM consulted-patient may need to be moved to ICU and required ventilatory support. CTA chest 07/17/15: No PE, bibasilar atelectasis, early pulmonary edema. As per CCM, no consolidation on chest x-ray or CTA chest. On immunosuppressants PTA. DD includes viral pneumonitis versus an opportunistic lung infection. Plan to intubate and send various cultures. 3. Acute on chronic diastolic CHF: Precipitated by A. fib with RVR. Provided increased dose of IV Lasix on 8/2. Strict intake and output.  Cardiology following. 4. A. fib with RVR: New onset on 07/18/15. CHADSVASc = 5. Currently on oral metoprolol and IV Cardizem drip at [facility]. Discussed with cardiology on 2/8 and started IV amiodarone. TSH 0.678. On IV heparin infusion after discussing with neurosurgery. 5. Elevated troponin: Likely secondary to demand ischemia. Per cardiology. 6. Acute encephalopathy: CT brain 07/17/15: No acute findings. Overnight agitation. No clear etiology at this time. As per CCM input, DD includes encephalitis, delirium secondary to an underlying infection of some sort or less likely due to some form of drug toxicity. Considering MRI brain, EEG and possibly a lumbar puncture. 7. Acute on stage III chronic kidney disease: Baseline creatinine: 1.2-1.4. Creatinine had worsened to 1.92 but has improved to 1.4 again. Monitor while being diuresed. 8. Hyponatremia: Resolved. 9. Status post lumbar laminectomy: Neurosurgery following and doubt CNS infection. 10. Type II DM: A1c on 05/24/15:7.6. Continue SSI. 11. Essential hypertension: Blood pressures were normal this morning but have since come down in the soft/hypotensive range. Management per CCM-starting stress dose hydrocortisone and holding metoprolol and Cardizem. 12. CAD: On aspirin. Myoview 05/12/15: Low risk. Has been off of Plavix this past week due to back surgery. 13. Chronic thrombocytopenia: Resolved. 14. Rheumatoid arthritis: Methotrexate and prednisone on hold. Stress dose hydrocortisone. 15. Anemia: Stable.    DVT prophylaxis: IV heparin drip.  Code Status: Full  Family Communication: Discussed extensively with patient's daughter at bedside on 07/21/15.  Disposition Plan: Not medically stable for discharge. Since last evaluation, patient has been evaluated by CCM and transferred to ICU.  Consultants:  Neurosurgery   Cardiology   CCM   Procedures:  Foley catheter   BiPAP   Antimicrobials:  IV ampicillin 2/4 > 2/5  IV cefepime 2/5 >  IV  Rocephin 1 dose on 2/4  IV vancomycin 2/4 >  Subjective: Patient pleasantly confused. Discussed with patient's night and this morning's RN, progressive worsening tachypnea/dyspnea since 7 PM last night, rapid response evaluated at night and patient received a dose of Ativan and Dilaudid for agitation/pain respectively with minimal effect. Pulled out BiPAP.  Objective: Filed Vitals:   07/21/15 1315 07/21/15 1330 07/21/15 1345 07/21/15 1400  BP: 95/67 94/62 91/61  94/61  Pulse: 105 93 95 98  Temp:      TempSrc:      Resp: 18 23 18 27   Height:      Weight:      SpO2: 100% 100% 100% 100%    Intake/Output Summary (Last 24 hours) at 07/21/15 1425 Last data filed at 07/21/15 1400  Gross per 24 hour  Intake 2178.08 ml  Output   2241 ml  Net -62.92 ml   Filed Weights   07/19/15 0300 07/20/15 0300 07/21/15 0300  Weight: 93.3 kg (205 lb 11 oz) 92.8 kg (204 lb 9.4 oz) 92.7 kg (204 lb 5.9 oz)    Exam:  General exam: Moderately built and nourished elderly male, ill looking, lying propped up in bed with moderate respiratory distress.  Respiratory system: Reduced breath sounds bilaterally with scattered few bibasal crackles and occasional medium pitched expiratory rhonchi. Moderate  increased work of breathing. Cardiovascular system: S1 & S2 heard,irregularly irregular and tachycardic. No JVD, murmurs, gallops, clicks or pedal edema.Telemetry: Atrial fibrillation with RVR in the 120s. Sacral edema +.  Gastrointestinal system: Abdomen is nondistended, soft and nontender. Normal bowel sounds heard. Central nervous system: Alert and oriented only to person. No focal neurological deficits. Extremities: Symmetric 5 x 5 power.   Data Reviewed: Basic Metabolic Panel:  Recent Labs Lab 07/17/15 1750 07/18/15 0242 07/19/15 0230 07/20/15 0400 07/21/15 0330  NA 137 136 133* 138 136  K 4.1 3.9 4.5 3.6 3.6  CL 106 103 102 104 102  CO2 22 20* 20* 20* 22  GLUCOSE 74 65 242* 134* 210*  BUN  18 16 20  21* 17  CREATININE 1.76* 1.45* 1.92* 1.62* 1.40*  CALCIUM 8.1* 7.6* 7.1* 7.3* 8.0*   Liver Function Tests:  Recent Labs Lab 07/17/15 1750  AST 42*  ALT 18  ALKPHOS 43  BILITOT 1.0  PROT 5.8*  ALBUMIN 2.9*   No results for input(s): LIPASE, AMYLASE in the last 168 hours. No results for input(s): AMMONIA in the last 168 hours. CBC:  Recent Labs Lab 07/17/15 1750 07/18/15 0242 07/19/15 0230 07/20/15 0400 07/21/15 0330  WBC 8.5 9.0 8.6 6.4 7.3  NEUTROABS 5.4  --   --   --   --   HGB 10.1* 9.7* 8.5* 8.7* 9.1*  HCT 31.3* 29.6* 25.0* 26.8* 27.4*  MCV 98.4 99.7 97.3 96.1 94.2  PLT 156 134* 134* 158 176   Cardiac Enzymes:  Recent Labs Lab 07/17/15 1750 07/18/15 0909 07/18/15 1334  TROPONINI 0.55* 1.01* 2.06*   BNP (last 3 results) No results for input(s): PROBNP in the last 8760 hours. CBG:  Recent Labs Lab 07/20/15 1743 07/20/15 2114 07/21/15 0650 07/21/15 0931 07/21/15 1220  GLUCAP 178* 210* 209* 248* 335*    Recent Results (from the past 240 hour(s))  Culture, blood (routine x 2)     Status: None (Preliminary result)   Collection Time: 07/17/15  5:50 PM  Result Value Ref Range Status   Specimen Description BLOOD LEFT HAND  Final   Special Requests BOTTLES DRAWN AEROBIC AND ANAEROBIC 5CC  Final   Culture NO GROWTH 4 DAYS  Final  Report Status PENDING  Incomplete  Urine culture     Status: None   Collection Time: 07/17/15  6:11 PM  Result Value Ref Range Status   Specimen Description URINE, CLEAN CATCH  Final   Special Requests NONE  Final   Culture MULTIPLE SPECIES PRESENT, SUGGEST RECOLLECTION  Final   Report Status 07/19/2015 FINAL  Final  Culture, blood (routine x 2)     Status: None (Preliminary result)   Collection Time: 07/17/15  6:15 PM  Result Value Ref Range Status   Specimen Description BLOOD RIGHT HAND  Final   Special Requests BOTTLES DRAWN AEROBIC AND ANAEROBIC 5CC  Final   Culture NO GROWTH 4 DAYS  Final   Report Status  PENDING  Incomplete  MRSA PCR Screening     Status: None   Collection Time: 07/17/15 11:06 PM  Result Value Ref Range Status   MRSA by PCR NEGATIVE NEGATIVE Final    Comment:        The GeneXpert MRSA Assay (FDA approved for NASAL specimens only), is one component of a comprehensive MRSA colonization surveillance program. It is not intended to diagnose MRSA infection nor to guide or monitor treatment for MRSA infections.          Studies: Portable Chest Xray  07/21/2015  CLINICAL DATA:  Hypoxia EXAM: PORTABLE CHEST 1 VIEW COMPARISON:  July 21, 2015 FINDINGS: Endotracheal tube tip is 4.4 cm above the carina. Nasogastric tube tip and side port are below the diaphragm. Central catheter tip is in the superior vena cava. No pneumothorax. There is persistent pulmonary vascular congestion. There is increase in opacity in the left base region. There is postoperative change in the lower thoracic and visualized upper lumbar region. IMPRESSION: Tube and catheter positions as described without pneumothorax. Increased consolidation left base ; question developing pneumonia or possibly aspiration. Pulmonary vascular congestion with a degree of congestive heart failure remains without change. Electronically Signed   By: Lowella Grip III M.D.   On: 07/21/2015 11:14   Dg Chest Port 1 View  07/21/2015  CLINICAL DATA:  Decreased oxygen saturation EXAM: PORTABLE CHEST 1 VIEW COMPARISON:  July 19, 2015 FINDINGS: Central catheter tip is in the superior vena cava. No pneumothorax. Interstitial edema remains, stable. There is no airspace consolidation. There is cardiomegaly with mild pulmonary venous hypertension. No new opacity. No adenopathy. There is postoperative change in the lower thoracic and visualized upper lumbar region. IMPRESSION: Evidence of a degree of congestive heart failure, stable. No new opacity. No change in cardiac silhouette. Central catheter tip in superior vena cava. No  pneumothorax evident. Electronically Signed   By: Lowella Grip III M.D.   On: 07/21/2015 07:52        Scheduled Meds: . antiseptic oral rinse  7 mL Mouth Rinse QID  . ceFEPime (MAXIPIME) IV  2 g Intravenous Q24H  . chlorhexidine gluconate  15 mL Mouth Rinse BID  . [START ON [id number] folic acid  1 mg Per Tube Daily  . hydrocortisone sod succinate (SOLU-CORTEF) inj  50 mg Intravenous Q6H  . insulin aspart  0-9 Units Subcutaneous 6 times per day  . levalbuterol  0.63 mg Nebulization Q6H  . pantoprazole (PROTONIX) IV  40 mg Intravenous Daily  . sodium chloride flush  10-40 mL Intracatheter Q12H  . vancomycin  750 mg Intravenous Q12H   Continuous Infusions: . sodium chloride 75 mL/hr (07/21/15 1406)  . heparin 1,700 Units/hr (07/21/15 1100)  . phenylephrine (NEO-SYNEPHRINE) Adult infusion    .  propofol (DIPRIVAN) infusion 30 mcg/kg/min (07/21/15 1400)    Principal Problem:   Sepsis ([place]) Active Problems:   Essential hypertension, benign   Coronary atherosclerosis   Rheumatoid arthritis (HCC)   Acute encephalopathy   LLL pneumonia   Type II or unspecified type diabetes mellitus without mention of complication, not stated as uncontrolled   S/P lumbar laminectomy   Elevated troponin   Acute respiratory failure with hypoxia ([facility])   Atrial fibrillation with RVR ([place])   Acute pulmonary edema ([place])   HCAP (healthcare-associated pneumonia)   Palliative care encounter   DNR (do not resuscitate) discussion    Time spent: 60 minutes.    Vernell Leep, MD, FACP, FHM. [facility] Pager [phone]  If 7PM-7AM, please contact night-coverage [email or web address] Password [email or web address] 07/21/2015, 2:25 PM    LOS: 4 days

## 2015-07-21 NOTE — Progress Notes (Signed)
Patient ID: Curtis Frazier, male   DOB: [birth year], 75 y.o.   MRN: [id number] Subjective:  The patient is a bit more confused today. He is tachypnic.  Objective: Vital signs in last 24 hours: Temp:  [98.5 F (36.9 C)-102.8 F (39.3 C)] 99.5 F (37.5 C) (02/08 0720) Pulse Rate:  [97-131] 131 (02/08 0809) Resp:  [25-41] 41 (02/08 0809) BP: (100-142)/(59-102) 115/72 mmHg (02/08 0809) SpO2:  [92 %-100 %] 100 % (02/08 0810) FiO2 (%):  [40 %] 40 % (02/08 0654) Weight:  [92.7 kg (204 lb 5.9 oz)] 92.7 kg (204 lb 5.9 oz) (02/08 0300)  Intake/Output from previous day: 02/07 0701 - 02/08 0700 In: 795.3 [P.O.:120; I.V.:475.3; IV Piggyback:200] Out: [id number] [Urine:4250; Stool:2] Intake/Output this shift:    Physical exam the patient is alert. He is moving his lower extremities well. His wound is healing well. There is no discharge. He does not have a dressing on.  Lab Results:  Recent Labs  07/20/15 0400 07/21/15 0330  WBC 6.4 7.3  HGB 8.7* 9.1*  HCT 26.8* 27.4*  PLT 158 176   BMET  Recent Labs  07/20/15 0400 07/21/15 0330  NA 138 136  K 3.6 3.6  CL 104 102  CO2 20* 22  GLUCOSE 134* 210*  BUN 21* 17  CREATININE 1.62* 1.40*  CALCIUM 7.3* 8.0*    Studies/Results: Dg Chest Port 1 View  07/21/2015  CLINICAL DATA:  Decreased oxygen saturation EXAM: PORTABLE CHEST 1 VIEW COMPARISON:  July 19, 2015 FINDINGS: Central catheter tip is in the superior vena cava. No pneumothorax. Interstitial edema remains, stable. There is no airspace consolidation. There is cardiomegaly with mild pulmonary venous hypertension. No new opacity. No adenopathy. There is postoperative change in the lower thoracic and visualized upper lumbar region. IMPRESSION: Evidence of a degree of congestive heart failure, stable. No new opacity. No change in cardiac silhouette. Central catheter tip in superior vena cava. No pneumothorax evident. Electronically Signed   By: Lowella Grip III M.D.   On: 07/21/2015 07:52    Dg Chest Port 1 View  07/19/2015  CLINICAL DATA:  Respiratory distress EXAM: PORTABLE CHEST 1 VIEW COMPARISON:  07/18/2015 FINDINGS: Cardiomegaly with vascular congestion and diffuse interstitial prominence compatible with edema. Right PICC line is in place with the tip at the cavoatrial junction. Possible layering left effusion. No acute bony abnormality. IMPRESSION: Findings compatible with CHF.  Possible small left effusion. Electronically Signed   By: Rolm Baptise M.D.   On: 07/19/2015 13:40    Assessment/Plan: Postop day #6: The patient is stable neurologically. I will asked the nurses to apply dressing so we can make sure there is no drainage from the wound.  Respiratory distress: Pulmonology is seeing the patient.  I spoke with the patient's daughter.  LOS: 4 days     Albirda Shiel D 07/21/2015, 8:59 AM

## 2015-07-21 NOTE — Consult Note (Signed)
PULMONARY / CRITICAL CARE MEDICINE   Name: Curtis Frazier MRN: [id number] DOB: 31-May-1941    ADMISSION DATE:  07/17/2015 CONSULTATION DATE: 2/8  REFERRING MD:  Hongalgi   CHIEF COMPLAINT:  Acute respiratory failure and sepsis   HISTORY OF PRESENT ILLNESS:   47 yom who is s/p recent lumbar surg on 2/2. Following d/c on 2/4 developed acute confusion and worsening weakness w/ fever > 102. He was brought to the ER. Admitted w/ working dx of sepsis felt d/t PNA +/- UTI. His hospital course was c/b intermittent fever, AF w/ RVR, hypoxia and confusion. He was treated w/ IVFs and empiric abx. On 2/7 apparently had been doing better. On 2/8 developed increased SOB, for which he was placed on NIPPV, this was followed again by fever and then worsening agitation. He had significant hypoxia w/ P/F ratio of  77. PCCM was asked to assess after his agitation escalated to point he would no longer keep his BIPAP on.   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[place]); Other psoriasis; Scoliosis (and kyphoscoliosis), idiopathic; GI bleed (12/10); Gastritis (12/10); Hyperlipidemia; Hypertension; Myocardial infarction ([place]) (2010); Shortness of breath; Pneumonia; Headache(784.0); Joint pain; Joint swelling; Rheumatoid arthritis(714.0); Degeneration of intervertebral disc, site unspecified; Chronic back pain; Psoriasis; Bruises easily; Esophageal reflux; History of colonic polyps; Hemorrhoids; Kidney stone; Blood transfusion (2010); Type II or unspecified type diabetes mellitus without mention of complication, not stated as uncontrolled; PMR (polymyalgia rheumatica) (HCC) (01/20/2012); and S/P PTCA (percutaneous transluminal coronary angioplasty).  PAST SURGICAL HISTORY: He  has past surgical history that includes Rotator cuff  repair (Left, 2000); Colonoscopy; Vasectomy (1979); Coronary angioplasty with stent (02/2009); carotid doppler (10/12); Cataract extraction (2012); Cardiac catheterization (2010); and Lithotripsy (2009).  Allergies  Allergen Reactions  . Ace Inhibitors Other (See Comments)    Causes high K  . Angiotensin Receptor Blockers Other (See Comments)    Causes high K  . Metoprolol Other (See Comments)    May cause elevated K level  . Penicillins Swelling and Rash  . Tetracycline Swelling and Rash    No current facility-administered medications on file prior to encounter.   Current Outpatient Prescriptions on File Prior to Encounter  Medication Sig  . aspirin EC 81 MG tablet Take 81 mg by mouth daily.  . Calcium Carbonate-Vit D-Min (CALCIUM 1200 PO) Take 1 tablet by mouth daily.   . Cholecalciferol 1000 UNITS capsule Take 1,000 Units by mouth daily.    . clopidogrel (PLAVIX) 75 MG tablet TAKE 1 TABLET BY MOUTH EVERY DAY  . fish oil-omega-3 fatty acids 1000 MG capsule Take 1 g by mouth daily.   . folic acid (FOLVITE) 1 MG tablet Take 1 mg by mouth daily.   Marland Kitchen glipiZIDE (GLUCOTROL XL) 5 MG 24 hr tablet TAKE 1 TABLET BY MOUTH EVERY DAY WITH BREAKFAST  . InFLIXimab (REMICADE IV) Inject 100 mg into the vein See admin instructions. Takes every 2 months  . methotrexate (RHEUMATREX) 2.5 MG tablet Take by mouth once a week. 4 TABS WEEKLY ./CY  . metoprolol succinate (TOPROL-XL) 25 MG 24 hr tablet TAKE 1 TABLET (25 MG TOTAL) BY MOUTH DAILY.  . nitroGLYCERIN (NITROSTAT) 0.4 MG SL tablet Place 1 tablet (0.4 mg total) under the tongue every 5 (five) minutes as needed for chest pain (x 3 doses).  Marland Kitchen omeprazole (PRILOSEC)  20 MG capsule Take 20 mg by mouth daily.   . pravastatin (PRAVACHOL) 20 MG tablet TAKE 1 TABLET BY MOUTH DAILY  . predniSONE (DELTASONE) 5 MG tablet Take 5 mg by mouth daily.   . VOLTAREN 1 % GEL Takes 1 application topically daily as needed for pain    FAMILY HISTORY:  His indicated that  his mother is deceased. He indicated that his father is deceased.   SOCIAL HISTORY: He  reports that he has quit smoking. He has never used smokeless tobacco. He reports that he drinks alcohol. He reports that he does not use illicit drugs.  REVIEW OF SYSTEMS:   Unable d/t delirium   SUBJECTIVE:  Agitated prior to intubation   VITAL SIGNS: BP 97/60 mmHg  Pulse 132  Temp(Src) 103.2 F (39.6 C) (Axillary)  Resp 16  Ht 5\' 8"  (1.727 m)  Wt 204 lb 5.9 oz (92.7 kg)  BMI 31.08 kg/m2  SpO2 93%  HEMODYNAMICS:    VENTILATOR SETTINGS: Vent Mode:  [-]  FiO2 (%):  [40 %] 40 %  INTAKE / OUTPUT: I/O last 3 completed shifts: In: 1081.7 [P.O.:120; I.V.:611.7; IV Piggyback:350] Out: 5602 [Urine:5600; Stool:2]  PHYSICAL EXAMINATION: General:  Chronically ill appearing elderly white male, Confused and restless prior to intubation  Neuro:  Awake, confused, moves all extremities, no focal def  HEENT:  MM are dry, no JVD.  Cardiovascular:  Tachy rrr w/ no MRG. AF w/ RVR on tele  Lungs:  + accessory muscle use. Decreased bases. No wheeze, no rhonchi  Abdomen:  Soft, not tender. + bowel sounds  Musculoskeletal:  Equal st and bulk  Skin:  Warm, diaphoretic 2+ rapid pulses.   LABS:  BMET  Recent Labs Lab 07/19/15 0230 07/20/15 0400 07/21/15 0330  NA 133* 138 136  K 4.5 3.6 3.6  CL 102 104 102  CO2 20* 20* 22  BUN 20 21* 17  CREATININE 1.92* 1.62* 1.40*  GLUCOSE 242* 134* 210*    Electrolytes  Recent Labs Lab 07/19/15 0230 07/20/15 0400 07/21/15 0330  CALCIUM 7.1* 7.3* 8.0*    CBC  Recent Labs Lab 07/19/15 0230 07/20/15 0400 07/21/15 0330  WBC 8.6 6.4 7.3  HGB 8.5* 8.7* 9.1*  HCT 25.0* 26.8* 27.4*  PLT 134* 158 176    Coag's  Recent Labs Lab 07/17/15 2349  APTT 34  INR 1.17    Sepsis Markers  Recent Labs Lab 07/17/15 2022 07/17/15 2349 07/18/15 0247 07/19/15 1622 07/21/15 0330  LATICACIDVEN 0.85 1.2 1.0  --   --   PROCALCITON  --  1.36  --   3.80 1.93    ABG  Recent Labs Lab 07/18/15 2324 07/19/15 0316 07/21/15 0645  PHART 7.402 7.350 7.476*  PCO2ART 29.7* 32.9* 26.6*  PO2ART 60.4* 88.7 77.4*    Liver Enzymes  Recent Labs Lab 07/17/15 1750  AST 42*  ALT 18  ALKPHOS 43  BILITOT 1.0  ALBUMIN 2.9*    Cardiac Enzymes  Recent Labs Lab 07/17/15 1750 07/18/15 0909 07/18/15 1334  TROPONINI 0.55* 1.01* 2.06*    Glucose  Recent Labs Lab 07/20/15 0817 07/20/15 1225 07/20/15 1743 07/20/15 2114 07/21/15 0650 07/21/15 0931  GLUCAP 154* 208* 178* 210* 209* 248*    Imaging Dg Chest Port 1 View  07/21/2015  CLINICAL DATA:  Decreased oxygen saturation EXAM: PORTABLE CHEST 1 VIEW COMPARISON:  July 19, 2015 FINDINGS: Central catheter tip is in the superior vena cava. No pneumothorax. Interstitial edema remains, stable. There is no airspace  consolidation. There is cardiomegaly with mild pulmonary venous hypertension. No new opacity. No adenopathy. There is postoperative change in the lower thoracic and visualized upper lumbar region. IMPRESSION: Evidence of a degree of congestive heart failure, stable. No new opacity. No change in cardiac silhouette. Central catheter tip in superior vena cava. No pneumothorax evident. Electronically Signed   By: Lowella Grip III M.D.   On: 07/21/2015 07:52  interstitial filling w/ possible edema    STUDIES:   ECHO 2/7: EF 65-70%; indeterminate of diastolic fxn, Peak PAP 32 mmHg 2/8:MRI brain & spine>>> 2/8: EEG >>  CULTURES: BCX2 2/8>>> Respiratory culture 2/8>>> PCP 2/8>>>  ANTIBIOTICS: vanc 2/4>>> Cefepime 2/5>>>  SIGNIFICANT EVENTS:   LINES/TUBES: Oett 2/8>>> PICC RUE 2/5>>>  DISCUSSION: 11 yom s/p recent lumbar surgery. Admitted 2/4 w/ delirium and working dx of sepsis in setting of PNA +/- UTI. Course c/b AF w/ RVR, intermittent fevers and respiratory failure in spite of ABX. PCCM asked to see on 2/8 for worsening agitation, respiratory failure and  distress.   ASSESSMENT / PLAN:  PULMONARY A: Acute Hypoxic Respiratory Failure Pulmonary infiltrates.  D-dx include PNA/ evolving ALI vs Pulmonary edema. He is on immunosuppression at baseline so need to consider opportunistic infection.  Failed NIPPV  P:   Intubate/ventilate F/u post-intubation film BAL (see ID section) PAD protocol   CARDIOVASCULAR A:  SIRS/Sepsis PAF w/ RVR Trop I elevation  P:  Ck CVP w/ goal CVP 8-12 MAP goal >65 Ck lactic acid to r/o occult shock  Will provide IVFs to achieve  Cont amio-->although suspect that RVR will improve after ventilation/sedation Cont heparin gtt  Additional recs per cards  RENAL A:   CKD stage 3 w/ resolving acute on chronic renal failure   P:   Avoid hypotension  Trend chemistry  Strict I&O  GASTROINTESTINAL A:   No acute but at risk for critical illness related malnutrition  P:   PPI for SUP tubefeeds w/ nutritional consult   HEMATOLOGIC A:   Anemia w/out evidence of blood loss Chronic Thrombocytopenia  P:  Trend CBC  Heparin per cards section  Transfuse per Protocol   INFECTIOUS A:   Severe sepsis source unclear.  R/o PNA R/o UTI  Had been treating w/ working dx of PNA and also possible UTI w/ multi-org UC.  Also consider recent lumbar surgery-->need to consider abscess although surgical site looks clean  P:   BAL also send PCP See  ENDOCRINE A:   DM w/ hyperglycemia  P:   SSI  NEUROLOGIC A:   Acute Encephalopathy Recent L spine surgery  P:   RASS goal: -3 for now given extensive diagnostics. Then change to -1 as able PAD protocol  MRI brain & spine EEG  FAMILY  - Updates: daughter updated fully at bedside  - Inter-disciplinary family meet or Palliative Care meeting due by:  2/18  Erick Colace ACNP-BC [place] Pager # [phone] OR # [phone] if no answer   07/21/2015, 9:56 AM

## 2015-07-21 NOTE — Progress Notes (Signed)
RN alerted by pt daughter that the pt wanted to be slid up in bed.  When another RN entered the room, pt stated that he wanted to "go back to another room he was in."  This RN entered entered the room and the pt oriented to self, city, birthday, but unable to answer question correctly when asked about year, month, hospital.  Pt placed on BiPAP.  Will continue to monitor.

## 2015-07-21 NOTE — Progress Notes (Signed)
ANTICOAGULATION CONSULT NOTE - Follow Up Consult  Pharmacy Consult for heparin Indication: atrial fibrillation   Labs:  Recent Labs  07/18/15 0909 07/18/15 1334 07/18/15 1803  07/19/15 0230 07/20/15 0400 07/20/15 2000 07/21/15 0330  HGB  --   --   --   < > 8.5* 8.7*  --  9.1*  HCT  --   --   --   --  25.0* 26.8*  --  27.4*  PLT  --   --   --   --  134* 158  --  176  HEPARINUNFRC  --   --  <0.10*  --   --   --  0.17* 0.31  CREATININE  --   --   --   --  1.92* 1.62*  --  1.40*  TROPONINI 1.01* 2.06*  --   --   --   --   --   --   < > = values in this interval not displayed.   Assessment: 75yo male now therapeutic on heparin after rate increase though at very low end of goal.  Goal of Therapy:  Heparin level 0.3-0.7 units/ml   Plan:  Will increase heparin gtt to 1700 units/hr to prevent dropping below goal and check level in 6-8hr.  Wynona Neat, PharmD, BCPS  07/21/2015,4:02 AM

## 2015-07-21 NOTE — Progress Notes (Signed)
Patient to transfer to 2M03 report given to receiving nurse, all questions answered at this time.

## 2015-07-21 NOTE — Progress Notes (Signed)
Spoke with Respiratory about pt respirations being 20 to high 30.  Sp02 maintaining 90-100s.  Lungs clear/diminished bilaterally.  Will continue to monitor pt.

## 2015-07-21 NOTE — Progress Notes (Signed)
This RN entered pt room and noted increased work of breathing and increased new agitation. Paged rapid.  Pt able to answer questions about name and DOB.  Able to follow commands.  Will continue to monitor pt.

## 2015-07-21 NOTE — Procedures (Signed)
PCCM Video Bronchoscopy Procedure Note  The patient was informed of the risks (including but not limited to bleeding, infection, respiratory failure, lung injury, tooth/oral injury) and benefits of the procedure and gave consent, see chart.  Indication: Acute respiratory failure with hypoxemia  Post Procedure Diagnosis: Same  Location: [facility]  Condition pre procedure: Stable on mechanical ventilator  Medications for procedure: Stable on mechanical ventilator  Procedure description: The bronchoscope was introduced through the endotracheal tube and passed to the bilateral lungs to the level of the subsegmental bronchi throughout the tracheobronchial tree.  Airway exam revealed normal trachea, sharp carina.  There were scant, thin, yellow secretions throughout the airway.  There were no airway lesions or inflammation identified.  Procedures performed:  BAL RML 60cc sterile saline instilled, 30cc cloudy return  Specimens sent:  Bacterial culture PCP Fungal culture AFB culture CMV culture PCP stain   Condition post procedure:  Stable on vent  EBL: None   Complications: none immediate  Roselie Awkward, MD [place] [facility] Pager: [phone] Cell: [phone] After 3pm or if no response, call [phone]

## 2015-07-21 NOTE — Significant Event (Signed)
Rapid Response Event Note  Overview: Time Called: 0725 Arrival Time: 0727 Event Type: Respiratory  Initial Focused Assessment: Patient Alert, but very restless and agitated, attempting to get OOB. Pink and warm to touch  Temp 102. Lung sounds decreased bases, some wheezes. Heart tones irregular BP 137/80  AF 130s  RR 40  O2 at 100% on NRB Daughter at bedside  Interventions: PCXR done Lasix given IV,  1mg  Morphine given IV Cardizem gtt d/c'd.  Amiodarone bolus and gtt started, no change in HR Patient continues to be very agitated and restless. CCM at bedside Transferred to 2M03 via bed with O2 and heart monitor. Intubated   Event Summary: Name of Physician Notified: Dr Lindaann Pascal at bedside at    Name of Consulting Physician Notified: CCM  -  Laurey Arrow NP and Dr Lake Bells at 0800  Outcome: Transferred (Comment) [phone])  Event End Time: [place]  Raliegh Ip

## 2015-07-21 NOTE — Progress Notes (Signed)
UR Completed. Alliyah Roesler, RN, BSN.  [phone] 

## 2015-07-21 NOTE — Progress Notes (Signed)
Pt transported to MRI per orders .Heparin on hold per orders.

## 2015-07-21 NOTE — Progress Notes (Signed)
   07/21/15 1400  Clinical Encounter Type  Visited With Patient and family together  Visit Type Spiritual support  Referral From Nurse  Spiritual Encounters  Spiritual Needs Emotional  Stress Factors  Family Stress Factors Health changes;Major life changes;Lack of knowledge  Patient was asleep when I visited but I had a good visit with the wife, who requested prayer and shared the family's story. Wife is currently worried about fever of unknown origin for which they were about to do some kind of test or procedure.

## 2015-07-21 NOTE — Progress Notes (Signed)
Patient extremely agitated pulling off bipap.  Bipap removed and patient placed on non re-breather.  Updated physician on patients increased anxiety as well as increased HR and Respirations.  Received an order for 1mg  Morphine as well as chest xray and EKG.  Bedside EKG performed and results handed to MD.  Cardizem d/c'd and Amiodarone hung a bolus of 150mg  given.  Orders for transfer to ICU placed.

## 2015-07-21 NOTE — Progress Notes (Signed)
TELEMETRY: Reviewed telemetry pt in Atrial fibrillation with rate 130: Filed Vitals:   07/21/15 0700 07/21/15 0720 07/21/15 0800 07/21/15 0810  BP: 142/102 137/80 133/78   Pulse: 112 130 128   Temp:      TempSrc:      Resp: 37 27 33   Height:      Weight:      SpO2: 97% 99% 100% 100%    Intake/Output Summary (Last 24 hours) at 07/21/15 0856 Last data filed at 07/21/15 0400  Gross per 24 hour  Intake 795.28 ml  Output   4252 ml  Net -3456.72 ml   Filed Weights   07/19/15 0300 07/20/15 0300 07/21/15 0300  Weight: 93.3 kg (205 lb 11 oz) 92.8 kg (204 lb 9.4 oz) 92.7 kg (204 lb 5.9 oz)    Subjective Patient with increased agitation, hypoxia overnight. Now on non-rebreather. Increased work of breathing. Mental status changes.    Marland Kitchen antiseptic oral rinse  7 mL Mouth Rinse q12n4p  . aspirin EC  81 mg Oral Daily  . budesonide (PULMICORT) nebulizer solution  0.5 mg Nebulization BID  . ceFEPime (MAXIPIME) IV  2 g Intravenous Q24H  . chlorhexidine  15 mL Mouth Rinse BID  . cholecalciferol  1,000 Units Oral Daily  . folic acid  1 mg Oral Daily  . furosemide  80 mg Intravenous Daily  . insulin aspart  0-9 Units Subcutaneous TID WC  . levalbuterol  0.63 mg Nebulization Q6H  . metoprolol tartrate  25 mg Oral BID  . pantoprazole  40 mg Oral Daily  . pravastatin  20 mg Oral Daily  . sodium chloride flush  10-40 mL Intracatheter Q12H  . vancomycin  750 mg Intravenous Q12H   . amiodarone 60 mg/hr (07/21/15 0808)   Followed by  . amiodarone    . dextrose 20 mL/hr at 07/18/15 0726  . diltiazem (CARDIZEM) infusion 15 mg/hr (07/21/15 0605)  . heparin 1,700 Units/hr (07/21/15 0419)    LABS: Basic Metabolic Panel:  Recent Labs  07/20/15 0400 07/21/15 0330  NA 138 136  K 3.6 3.6  CL 104 102  CO2 20* 22  GLUCOSE 134* 210*  BUN 21* 17  CREATININE 1.62* 1.40*  CALCIUM 7.3* 8.0*   Liver Function Tests: No results for input(s): AST, ALT, ALKPHOS, BILITOT, PROT, ALBUMIN in the  last 72 hours. No results for input(s): LIPASE, AMYLASE in the last 72 hours. CBC:  Recent Labs  07/20/15 0400 07/21/15 0330  WBC 6.4 7.3  HGB 8.7* 9.1*  HCT 26.8* 27.4*  MCV 96.1 94.2  PLT 158 176   Cardiac Enzymes:  Recent Labs  07/18/15 0909 07/18/15 1334  TROPONINI 1.01* 2.06*   BNP: No results for input(s): PROBNP in the last 72 hours. D-Dimer: No results for input(s): DDIMER in the last 72 hours. Hemoglobin A1C: No results for input(s): HGBA1C in the last 72 hours. Fasting Lipid Panel: No results for input(s): CHOL, HDL, LDLCALC, TRIG, CHOLHDL, LDLDIRECT in the last 72 hours. Thyroid Function Tests:  Recent Labs  07/18/15 1334  TSH 0.678     Radiology/Studies:  DG CHEST PORT 1 VIEW [id number] Resulted: 07/21/15 0752    Order Status: Completed Updated: 07/21/15 0754   Narrative:    CLINICAL DATA: Decreased oxygen saturation  EXAM: PORTABLE CHEST 1 VIEW  COMPARISON: July 19, 2015  FINDINGS: Central catheter tip is in the superior vena cava. No pneumothorax. Interstitial edema remains, stable. There is no airspace consolidation. There is cardiomegaly with  mild pulmonary venous hypertension. No new opacity. No adenopathy. There is postoperative change in the lower thoracic and visualized upper lumbar region.  IMPRESSION: Evidence of a degree of congestive heart failure, stable. No new opacity. No change in cardiac silhouette. Central catheter tip in superior vena cava. No pneumothorax evident.   Electronically Signed By: Lowella Grip III M.D. On: 07/21/2015 07:52   Ecg today shows Afib with RVR. Low voltage. No acute ST changes.  Echo: 07/20/15:Study Conclusions  - Left ventricle: The cavity size was normal. Wall thickness was normal. Systolic function was vigorous. The estimated ejection fraction was in the range of 65% to 70%. Indeterminant diastolic function. Although no diagnostic regional wall motion  abnormality was identified, this possibility cannot be completely excluded on the basis of this study. - Aortic valve: Moderately calcified leaflets. There was no stenosis. - Mitral valve: Mildly calcified annulus. There was trivial regurgitation. - Right ventricle: The cavity size was normal. Systolic function was normal. - Tricuspid valve: Peak RV-RA gradient (S): 29 mm Hg. - Pulmonary arteries: PA peak pressure: 32 mm Hg (S). - Inferior vena cava: The vessel was normal in size. The respirophasic diameter changes were in the normal range (>= 50%), consistent with normal central venous pressure.  Impressions:  - Technically difficult study with poor acoustic windows. Normal LV size with EF 65-70%. Normal RV size and systolic function. No significant valvular abnormalities.  PHYSICAL EXAM General: Elderly, obese WM confused and agitated.  Head: Normal Neck: Negative for carotid bruits. JVD not elevated. No adenopathy Lungs: diffuse bilateral wheezing. Increased work of breathing Heart: IRRR S1 S2 without murmur. Abdomen: Soft, non-tender, non-distended with normoactive bowel sounds.  Msk:  Strength and tone appears normal for age. Extremities: Tr edema.  Distal pedal pulses are 2+ and equal bilaterally. Neuro: Alert and oriented X 3. Moves all extremities spontaneously. Psych:  Responds to questions appropriately with a normal affect.  ASSESSMENT AND PLAN: 1. Acute sepsis.  2. Acute respiratory failure with hypoxemia secondary to #1. Concerned about unusual PNA with history of immunosuppression. Now with recurrent fever. No clear source for sepsis. CCM evaluating. Planning intubation with possible bronchoscopy. I think his presentation is not due to CHF. He diuresed very well last 2 days. Echo shows normal LV function. No ischemic changes on Ecg.  3. Afib with RVR. Rate has increased with increased agitation and work of breathing. On IV cardizem. Started on IV  amiodarone. On IV heparin for anticoagulation. I expect HR will improve with intubation and sedation.  4. Elevated troponin. Initial Ecg in sinus rhythm showed no acute change. Repeat in AFib with RVR initially showed new ST depression in the lateral leads. I suspect this is mostly demand ischemia in setting of sepsis, PNA, hypoxemia, anemia, afib, etc. Echo shows normal LV function and Ecg currently shows no ischemic changes. I do not anticipate further ischemic work up at this time with his other medical problems.  5. Acute on CKD stage 3- improved today. 6. RA on chronic steroids/ methotrexate. 7. DM type 2. 8. S/p lumbar laminectomy last Thursday 07/15/15.  9. CAD s/p BMS LCx in 2010. Myoview 05/12/15 was low risk with mild apical thinning. 10. Mild Aortic stenosis   Present on Admission:  . Sepsis ([place]) . Acute encephalopathy . LLL pneumonia . Rheumatoid arthritis ([place]) . Coronary atherosclerosis . Essential hypertension, benign . Elevated troponin  Signed, Aureliano Oshields [place], [place] 07/21/2015 8:56 AM

## 2015-07-22 ENCOUNTER — Inpatient Hospital Stay (HOSPITAL_COMMUNITY): Payer: Medicare Other

## 2015-07-22 LAB — BASIC METABOLIC PANEL
ANION GAP: 14 (ref 5–15)
Anion gap: 11 (ref 5–15)
BUN: 22 mg/dL — ABNORMAL HIGH (ref 6–20)
BUN: 30 mg/dL — AB (ref 6–20)
CALCIUM: 7.3 mg/dL — AB (ref 8.9–10.3)
CALCIUM: 7.4 mg/dL — AB (ref 8.9–10.3)
CHLORIDE: 102 mmol/L (ref 101–111)
CO2: 20 mmol/L — ABNORMAL LOW (ref 22–32)
CO2: 19 mmol/L — AB (ref 22–32)
CREATININE: 1.65 mg/dL — AB (ref 0.61–1.24)
CREATININE: 1.48 mg/dL — AB (ref 0.61–1.24)
Chloride: 104 mmol/L (ref 101–111)
GFR calc Af Amer: 51 mL/min — ABNORMAL LOW (ref >60)
GFR calc non Af Amer: 39 mL/min — ABNORMAL LOW (ref >60)
GFR calc non Af Amer: 44 mL/min — ABNORMAL LOW (ref >60)
GFR, EST AFRICAN AMERICAN: 45 mL/min — AB (ref >60)
GLUCOSE: 140 mg/dL — AB (ref 65–99)
Glucose, Bld: 295 mg/dL — ABNORMAL HIGH (ref 65–99)
Potassium: 3.4 mmol/L — ABNORMAL LOW (ref 3.5–5.1)
Potassium: 3.6 mmol/L (ref 3.5–5.1)
SODIUM: 133 mmol/L — AB (ref 135–145)
Sodium: 137 mmol/L (ref 135–145)

## 2015-07-22 LAB — GLUCOSE, CAPILLARY
GLUCOSE-CAPILLARY: 240 mg/dL — AB (ref 65–99)
GLUCOSE-CAPILLARY: 165 mg/dL — AB (ref 65–99)
GLUCOSE-CAPILLARY: 168 mg/dL — AB (ref 65–99)
GLUCOSE-CAPILLARY: 163 mg/dL — AB (ref 65–99)
GLUCOSE-CAPILLARY: 186 mg/dL — AB (ref 65–99)
GLUCOSE-CAPILLARY: 122 mg/dL — AB (ref 65–99)
Glucose-Capillary: 221 mg/dL — ABNORMAL HIGH (ref 65–99)
Glucose-Capillary: 240 mg/dL — ABNORMAL HIGH (ref 65–99)
Glucose-Capillary: 259 mg/dL — ABNORMAL HIGH (ref 65–99)
Glucose-Capillary: 258 mg/dL — ABNORMAL HIGH (ref 65–99)
Glucose-Capillary: 252 mg/dL — ABNORMAL HIGH (ref 65–99)
Glucose-Capillary: 207 mg/dL — ABNORMAL HIGH (ref 65–99)
Glucose-Capillary: 188 mg/dL — ABNORMAL HIGH (ref 65–99)
Glucose-Capillary: 180 mg/dL — ABNORMAL HIGH (ref 65–99)
Glucose-Capillary: 167 mg/dL — ABNORMAL HIGH (ref 65–99)
Glucose-Capillary: 195 mg/dL — ABNORMAL HIGH (ref 65–99)
Glucose-Capillary: 134 mg/dL — ABNORMAL HIGH (ref 65–99)
Glucose-Capillary: 136 mg/dL — ABNORMAL HIGH (ref 65–99)
Glucose-Capillary: 162 mg/dL — ABNORMAL HIGH (ref 65–99)

## 2015-07-22 LAB — PNEUMOCYSTIS JIROVECI SMEAR BY DFA: Pneumocystis jiroveci Ag: NEGATIVE

## 2015-07-22 LAB — CULTURE, BLOOD (ROUTINE X 2)
CULTURE: NO GROWTH
Culture: NO GROWTH

## 2015-07-22 LAB — PATHOLOGIST SMEAR REVIEW

## 2015-07-22 LAB — PHOSPHORUS: PHOSPHORUS: 2.4 mg/dL — AB (ref 2.5–4.6)

## 2015-07-22 LAB — CBC
HCT: 29.1 % — ABNORMAL LOW (ref 39.0–52.0)
HEMOGLOBIN: 10 g/dL — AB (ref 13.0–17.0)
MCH: 32.6 pg (ref 26.0–34.0)
MCHC: 34.4 g/dL (ref 30.0–36.0)
MCV: 94.8 fL (ref 78.0–100.0)
Platelets: 216 10*3/uL (ref 150–400)
RBC: 3.07 MIL/uL — ABNORMAL LOW (ref 4.22–5.81)
RDW: 16.6 % — ABNORMAL HIGH (ref 11.5–15.5)
WBC: 13.7 10*3/uL — ABNORMAL HIGH (ref 4.0–10.5)

## 2015-07-22 LAB — HEPARIN LEVEL (UNFRACTIONATED)
HEPARIN UNFRACTIONATED: 0.49 [IU]/mL (ref 0.30–0.70)
HEPARIN UNFRACTIONATED: 0.78 [IU]/mL — AB (ref 0.30–0.70)
HEPARIN UNFRACTIONATED: 0.69 [IU]/mL (ref 0.30–0.70)
Heparin Unfractionated: 0.66 IU/mL (ref 0.30–0.70)

## 2015-07-22 LAB — MAGNESIUM
MAGNESIUM: 1.4 mg/dL — AB (ref 1.7–2.4)
Magnesium: 2.3 mg/dL (ref 1.7–2.4)

## 2015-07-22 LAB — STREP PNEUMONIAE URINARY ANTIGEN: Strep Pneumo Urinary Antigen: NEGATIVE

## 2015-07-22 MED ORDER — AMIODARONE HCL IN DEXTROSE 360-4.14 MG/200ML-% IV SOLN
30.0000 mg/h | INTRAVENOUS | Status: DC
  Administered 2015-07-23 – 2015-07-30 (×17): 30 mg/h via INTRAVENOUS
  Filled 2015-07-22 (×35): qty 200

## 2015-07-22 MED ORDER — DEXTROSE 5 % IV SOLN
30.0000 mmol | Freq: Once | INTRAVENOUS | Status: AC
  Administered 2015-07-22: 30 mmol via INTRAVENOUS
  Filled 2015-07-22: qty 10

## 2015-07-22 MED ORDER — AMIODARONE LOAD VIA INFUSION
150.0000 mg | Freq: Once | INTRAVENOUS | Status: AC
  Administered 2015-07-22: 150 mg via INTRAVENOUS
  Filled 2015-07-22: qty 83.34

## 2015-07-22 MED ORDER — MAGNESIUM SULFATE 4 GM/100ML IV SOLN
4.0000 g | Freq: Once | INTRAVENOUS | Status: AC
  Administered 2015-07-22: 4 g via INTRAVENOUS
  Filled 2015-07-22: qty 100

## 2015-07-22 MED ORDER — AMIODARONE HCL IN DEXTROSE 360-4.14 MG/200ML-% IV SOLN
60.0000 mg/h | INTRAVENOUS | Status: AC
  Administered 2015-07-22 (×2): 60 mg/h via INTRAVENOUS
  Filled 2015-07-22: qty 200

## 2015-07-22 MED ORDER — SODIUM CHLORIDE 0.9 % IV SOLN
INTRAVENOUS | Status: DC
  Administered 2015-07-22: 2 [IU]/h via INTRAVENOUS
  Administered 2015-07-24: 3.5 [IU]/h via INTRAVENOUS
  Filled 2015-07-22 (×2): qty 2.5

## 2015-07-22 MED ORDER — DEXTROSE 5 % IV SOLN
500.0000 mg | INTRAVENOUS | Status: AC
  Administered 2015-07-22 – 2015-07-26 (×5): 500 mg via INTRAVENOUS
  Filled 2015-07-22 (×5): qty 500

## 2015-07-22 MED ORDER — PRO-STAT SUGAR FREE PO LIQD
60.0000 mL | Freq: Four times a day (QID) | ORAL | Status: DC
  Administered 2015-07-22 – 2015-07-27 (×23): 60 mL
  Filled 2015-07-22 (×28): qty 60

## 2015-07-22 MED ORDER — VITAL HIGH PROTEIN PO LIQD
1000.0000 mL | ORAL | Status: DC
  Administered 2015-07-22 – 2015-07-24 (×3): 1000 mL
  Administered 2015-07-24 (×6)
  Administered 2015-07-25 – 2015-07-28 (×3): 1000 mL
  Filled 2015-07-22 (×4): qty 1000

## 2015-07-22 NOTE — Progress Notes (Signed)
ANTICOAGULATION CONSULT NOTE  Pharmacy Consult for heparin Indication: atrial fibrillation  Patient Measurements: Height: 5\' 8"  (172.7 cm) Weight: 206 lb 9.1 oz (93.7 kg) IBW/kg (Calculated) : 68.4 Heparin Dosing Weight: 87.8 kg  Vital Signs: Temp: 100.2 F (37.9 C) (02/09 1957) Temp Source: Oral (02/09 1957) BP: 98/76 mmHg (02/09 2145) Pulse Rate: 126 (02/09 2145)  Labs:  Recent Labs  07/20/15 0400  07/21/15 0330  07/22/15 0500 07/22/15 0824 07/22/15 1424 07/22/15 2015 07/22/15 2143  HGB 8.7*  --  9.1*  --   --  10.0*  --   --   --   HCT 26.8*  --  27.4*  --   --  29.1*  --   --   --   PLT 158  --  176  --   --  216  --   --   --   HEPARINUNFRC  --   < > 0.31  < > 0.78*  --  0.66  --  0.49  CREATININE 1.62*  --  1.40*  --   --  1.65*  --  1.48*  --   < > = values in this interval not displayed.  Estimated Creatinine Clearance: 47.1 mL/min (by C-G formula based on Cr of 1.48).   . sodium chloride 75 mL/hr (07/22/15 1056)  . amiodarone 60 mg/hr (07/22/15 2102)   Followed by  . [START ON 07/23/2015] amiodarone    . heparin 1,750 Units/hr (07/22/15 0814)  . insulin (NOVOLIN-R) infusion 4.2 Units/hr (07/22/15 2142)  . phenylephrine (NEO-SYNEPHRINE) Adult infusion 40 mcg/min (07/22/15 1922)  . propofol (DIPRIVAN) infusion 40 mcg/kg/min (07/22/15 2221)     Assessment: 75 yo m on heparin for afib. Pt's plavix has been stopped and starting heparin was ok'ed by neurosurg. CHADSVASc = 5.   Heparin level 0.66 after decrease to 1750 units/hr. Minor bloody secretions in mouth cavity which RN attributes to trauma with intubation and metal plate in mouth - this has been ongoing since intubation and has not increased.   PM follow-up, heparin level now therapeutic on 1750 units/hr.  Goal of Therapy:  Heparin level 0.3-0.7 units/ml Monitor platelets by anticoagulation protocol: Yes   Plan:  Continue heparin infusion at 1750 units/hr Daily heparin level and CBC. Monitor for  bleeding - RN to call if increased mouth oozing  Uvaldo Rising, BCPS  Clinical Pharmacist Pager [phone]  07/22/2015 10:24 PM

## 2015-07-22 NOTE — Progress Notes (Signed)
TELEMETRY: Reviewed telemetry pt in Atrial fibrillation with rate 110-120: Filed Vitals:   07/22/15 0930 07/22/15 0945 07/22/15 1000 07/22/15 1015  BP: 93/71 84/60 73/57  92/66  Pulse: 117 116 129 123  Temp:      TempSrc:      Resp: 28 27 26 28   Height:      Weight:      SpO2: 95% 95% 95% 96%    Intake/Output Summary (Last 24 hours) at 07/22/15 1022 Last data filed at 07/22/15 0900  Gross per 24 hour  Intake 4126.31 ml  Output   1610 ml  Net 2516.31 ml   Filed Weights   07/20/15 0300 07/21/15 0300 07/22/15 0351  Weight: 92.8 kg (204 lb 9.4 oz) 92.7 kg (204 lb 5.9 oz) 93.7 kg (206 lb 9.1 oz)    Subjective Patient sedated and intubated.   Marland Kitchen antiseptic oral rinse  7 mL Mouth Rinse QID  . ceFEPime (MAXIPIME) IV  2 g Intravenous Q24H  . chlorhexidine gluconate  15 mL Mouth Rinse BID  . folic acid  1 mg Per Tube Daily  . hydrocortisone sod succinate (SOLU-CORTEF) inj  50 mg Intravenous Q6H  . levalbuterol  0.63 mg Nebulization Q6H  . pantoprazole (PROTONIX) IV  40 mg Intravenous Daily  . sodium chloride flush  10-40 mL Intracatheter Q12H  . vancomycin  750 mg Intravenous Q12H   . sodium chloride 75 mL/hr (07/21/15 1406)  . heparin 1,750 Units/hr (07/22/15 0814)  . insulin (NOVOLIN-R) infusion 7.2 mL/hr at 07/22/15 0800  . phenylephrine (NEO-SYNEPHRINE) Adult infusion 40 mcg/min (07/22/15 1000)  . propofol (DIPRIVAN) infusion 20 mcg/kg/min (07/22/15 1000)    LABS: Basic Metabolic Panel:  Recent Labs  07/21/15 0330 07/22/15 0824  NA 136 133*  K 3.6 3.4*  CL 102 102  CO2 22 20*  GLUCOSE 210* 295*  BUN 17 22*  CREATININE 1.40* 1.65*  CALCIUM 8.0* 7.3*  MG  --  1.4*  PHOS  --  2.4*   Liver Function Tests: No results for input(s): AST, ALT, ALKPHOS, BILITOT, PROT, ALBUMIN in the last 72 hours. No results for input(s): LIPASE, AMYLASE in the last 72 hours. CBC:  Recent Labs  07/21/15 0330 07/22/15 0824  WBC 7.3 13.7*  HGB 9.1* 10.0*  HCT 27.4* 29.1*    MCV 94.2 94.8  PLT 176 216   Cardiac Enzymes: No results for input(s): CKTOTAL, CKMB, CKMBINDEX, TROPONINI in the last 72 hours. BNP: No results for input(s): PROBNP in the last 72 hours. D-Dimer: No results for input(s): DDIMER in the last 72 hours. Hemoglobin A1C: No results for input(s): HGBA1C in the last 72 hours. Fasting Lipid Panel:  Recent Labs  07/21/15 1024  TRIG 127   Thyroid Function Tests: No results for input(s): TSH, T4TOTAL, T3FREE, THYROIDAB in the last 72 hours.  Invalid input(s): FREET3   Radiology/Studies:  DG CHEST PORT 1 VIEW [id number] Resulted: 07/22/15 0833    Order Status: Completed Updated: 07/22/15 0836   Narrative:    CLINICAL DATA: Ventilator dependent respiratory failure, coronary artery disease, sepsis, acute encephalopathy, left lower lobe pneumonia.  EXAM: PORTABLE CHEST 1 VIEW  COMPARISON: Portable chest x-ray of July 21, 2015  FINDINGS: The lungs are reasonably well inflated. There are bilateral pleural effusions layering posteriorly. There is left lower lobe atelectasis or pneumonia. The cardiac silhouette remains enlarged. The pulmonary vascularity remains engorged. The pulmonary interstitial markings are slightly more conspicuous overall today.  The endotracheal tube tip lies 4.7 cm above the  carina. The esophagogastric tube tip projects below the inferior margin of the image. The PICC line tip projects over the midportion of the SVC. The patient has undergone lower thoracic lumbar an upper lumbar posterior fusion.  IMPRESSION: CHF with mild pulmonary interstitial edema slightly more conspicuous today. Persistent left lower lobe atelectasis or pneumonia. Persistent bilateral pleural effusions layering posteriorly. The support tubes are in reasonable position.   Electronically Signed By: David [place] M.D. On: 07/22/2015 08:33     Echo: 07/20/15:Study Conclusions  - Left ventricle: The cavity size was  normal. Wall thickness was normal. Systolic function was vigorous. The estimated ejection fraction was in the range of 65% to 70%. Indeterminant diastolic function. Although no diagnostic regional wall motion abnormality was identified, this possibility cannot be completely excluded on the basis of this study. - Aortic valve: Moderately calcified leaflets. There was no stenosis. - Mitral valve: Mildly calcified annulus. There was trivial regurgitation. - Right ventricle: The cavity size was normal. Systolic function was normal. - Tricuspid valve: Peak RV-RA gradient (S): 29 mm Hg. - Pulmonary arteries: PA peak pressure: 32 mm Hg (S). - Inferior vena cava: The vessel was normal in size. The respirophasic diameter changes were in the normal range (>= 50%), consistent with normal central venous pressure.  Impressions:  - Technically difficult study with poor acoustic windows. Normal LV size with EF 65-70%. Normal RV size and systolic function. No significant valvular abnormalities.  PHYSICAL EXAM General: Elderly, obese WM intubated on vent Head: Normal Neck: Negative for carotid bruits. JVD not elevated. No adenopathy Lungs: diffuse  wheezing.  Heart: IRRR S1 S2 without murmur. Abdomen: Soft, non-tender, non-distended with normoactive bowel sounds.  Msk:  Strength and tone appears normal for age. Extremities: Tr edema.  Distal pedal pulses are 2+ and equal bilaterally. Neuro: sedated on vent   ASSESSMENT AND PLAN: 1. Acute sepsis.  2. Acute respiratory failure with hypoxemia secondary to #1. Concerned about atypical PNA with history of immunosuppression.  No clear source for sepsis. S/p bronch with BAL. No acute findings. Awaiting cultures.  I think his presentation is not due to CHF.  Echo shows normal LV function. No ischemic changes on Ecg. CXR still shows diffuse infiltrates but I think this is more inflammatory. 3. Afib with RVR. Rate under fair  control on no medication.On IV heparin for anticoagulation. No active bleeding. If additional rate control needed I would consider IV amiodarone with low BP.  4. Elevated troponin. Initial Ecg in sinus rhythm showed no acute change. Repeat in AFib with RVR initially showed new ST depression in the lateral leads. I suspect this is mostly demand ischemia in setting of sepsis, PNA, hypoxemia, anemia, afib, etc. Echo shows normal LV function and Ecg currently shows no ischemic changes. I do not anticipate further ischemic work up at this time with his other medical problems.  5. Acute on CKD stage 3 6. RA on chronic steroids/ methotrexate. 7. DM type 2. 8. S/p lumbar laminectomy  Thursday 07/15/15.  9. CAD s/p BMS LCx in 2010. Myoview 05/12/15 was low risk with mild apical thinning. 10. Mild Aortic stenosis   Present on Admission:  . Sepsis ([place]) . Acute encephalopathy . LLL pneumonia . Rheumatoid arthritis ([place]) . Coronary atherosclerosis . Essential hypertension, benign . Elevated troponin  Signed, Zyrah Wiswell [place], [place] 07/22/2015 10:22 AM

## 2015-07-22 NOTE — Progress Notes (Signed)
PULMONARY / CRITICAL CARE MEDICINE   Name: Curtis Frazier MRN: [id number] DOB: 1941-06-11    ADMISSION DATE:  07/17/2015 CONSULTATION DATE: 2/8  REFERRING MD:  Hongalgi   CHIEF COMPLAINT:  Acute respiratory failure and sepsis   HISTORY OF PRESENT ILLNESS:   75 yom who is s/p recent lumbar surg on 2/2. Following d/c on 2/4 developed acute confusion and worsening weakness w/ fever > 102. He was brought to the ER. Admitted w/ working dx of sepsis felt d/t PNA +/- UTI. His hospital course was c/b intermittent fever, AF w/ RVR, hypoxia and confusion. He was treated w/ IVFs and empiric abx. On 2/7 apparently had been doing better. On 2/8 developed increased SOB, for which he was placed on NIPPV, this was followed again by fever and then worsening agitation. He had significant hypoxia w/ P/F ratio of  77. PCCM was asked to assess after his agitation escalated to point he would no longer keep his BIPAP on.   SUBJECTIVE:  Intubated and sedated.  Per nurse patient has had severe whole body tremors like he may be withdrawing from something; only thing to think of is chronic pain medications. No history of substance abuse.   VITAL SIGNS: BP 103/78 mmHg  Pulse 109  Temp(Src) 98.3 F (36.8 C) (Oral)  Resp 24  Ht 5\' 8"  (1.727 m)  Wt 206 lb 9.1 oz (93.7 kg)  BMI 31.42 kg/m2  SpO2 96%  HEMODYNAMICS: CVP:  [12 mmHg-52 mmHg] 12 mmHg  VENTILATOR SETTINGS: Vent Mode:  [-] PRVC FiO2 (%):  [50 %-100 %] 50 % Set Rate:  [18 bmp-26 bmp] 26 bmp Vt Set:  [550 mL] 550 mL PEEP:  [5 cmH20] 5 cmH20 Plateau Pressure:  [15 cmH20-18 cmH20] 16 cmH20  INTAKE / OUTPUT: I/O last 3 completed shifts: In: 4507.7 [P.O.:120; I.V.:3007.7; NG/GT:30; IV Piggyback:1350] Out: 2811 [Urine:2410; Emesis/NG output:400; Stool:1]  PHYSICAL EXAMINATION:  General:  Chronically ill appearing elderly white male, Sedated Neuro: sedated, unable to follow commands or arouse  HEENT:  MM are dry, no JVD. ETT.  Cardiovascular:  Tachy,  irregular irregular rhythm. Lungs:  + accessory muscle use. Increased work of breathing. Decreased bases. Crackles.  Abdomen:  Soft, not tender. + bowel sounds  Musculoskeletal:  Equal st and bulk. No edema Skin:  Warm, dry, intact.  LABS:  BMET  Recent Labs Lab 07/20/15 0400 07/21/15 0330 07/22/15 0824  NA 138 136 133*  K 3.6 3.6 3.4*  CL 104 102 102  CO2 20* 22 20*  BUN 21* 17 22*  CREATININE 1.62* 1.40* 1.65*  GLUCOSE 134* 210* 295*   Electrolytes  Recent Labs Lab 07/20/15 0400 07/21/15 0330 07/22/15 0824  CALCIUM 7.3* 8.0* 7.3*  MG  --   --  1.4*  PHOS  --   --  2.4*    CBC  Recent Labs Lab 07/20/15 0400 07/21/15 0330 07/22/15 0824  WBC 6.4 7.3 13.7*  HGB 8.7* 9.1* 10.0*  HCT 26.8* 27.4* 29.1*  PLT 158 176 216    Coag's  Recent Labs Lab 07/17/15 2349  APTT 34  INR 1.17    Sepsis Markers  Recent Labs Lab 07/17/15 2349 07/18/15 0247 07/19/15 1622 07/21/15 0330 07/21/15 1045  LATICACIDVEN 1.2 1.0  --   --  1.2  PROCALCITON 1.36  --  3.80 1.93  --     ABG  Recent Labs Lab 07/19/15 0316 07/21/15 0645 07/21/15 1123  PHART 7.350 7.476* 7.359  PCO2ART 32.9* 26.6* 36.4  PO2ART 88.7  77.4* 94.0    Liver Enzymes  Recent Labs Lab 07/17/15 1750  AST 42*  ALT 18  ALKPHOS 43  BILITOT 1.0  ALBUMIN 2.9*    Cardiac Enzymes  Recent Labs Lab 07/17/15 1750 07/18/15 0909 07/18/15 1334  TROPONINI 0.55* 1.01* 2.06*    Glucose  Recent Labs Lab 07/22/15 0452 07/22/15 0554 07/22/15 0654 07/22/15 0751 07/22/15 0911 07/22/15 1016  GLUCAP 259* 258* 252* 240* 207* 188*    Imaging Mr Brain W Wo Contrast  07/21/2015  CLINICAL DATA:  75 year old male with recent back surgery. Fever, confusion, sepsis. Intubated. Initial encounter. EXAM: MRI HEAD WITHOUT AND WITH CONTRAST TECHNIQUE: Multiplanar, multiecho pulse sequences of the brain and surrounding structures were obtained without and with intravenous contrast. CONTRAST:  15mL  MULTIHANCE GADOBENATE DIMEGLUMINE 529 MG/ML IV SOLN COMPARISON:  Head CT without contrast 07/17/2015. FINDINGS: No restricted diffusion to suggest acute infarction. No midline shift, mass effect, evidence of mass lesion, ventriculomegaly, extra-axial collection or acute intracranial hemorrhage. Cervicomedullary junction and pituitary are within normal limits. Negative visualized cervical spine. Major intracranial vascular flow voids are preserved. Herandez and white matter signal is within normal limits for age throughout the brain. No cortical encephalomalacia or chronic cerebral blood products. No abnormal enhancement identified. No dural thickening. Intubated. Small volume fluid in the pharynx. Right greater than left mastoid effusions. Right greater than left paranasal sinus fluid and opacification. Other Visible internal auditory structures appear normal. Postoperative changes to both globes. Negative orbit and scalp soft tissues. Normal bone marrow signal. IMPRESSION: 1. Negative for age MRI appearance of the brain. 2. Mastoid and paranasal sinus fluid/inflammation in the setting of intubation. Electronically Signed   By: Genevie Ann M.D.   On: 07/21/2015 16:41   Mr Lumbar Spine W Wo Contrast  07/21/2015  CLINICAL DATA:  Status post lumbar laminectomy 2 days ago. History of rheumatoid arthritis. Fever and sepsis. Question infection. EXAM: MRI LUMBAR SPINE WITHOUT AND WITH CONTRAST TECHNIQUE: Multiplanar and multiecho pulse sequences of the lumbar spine were obtained without and with intravenous contrast. CONTRAST:  20 cc MultiHance IV. COMPARISON:  MRI lumbar spine 06/09/2015. Single intraoperative view of the lumbar spine 07/15/2015. FINDINGS: The patient is status post T10-L5 fusion with pedicle screws and stabilization bars in place. Extensive artifact from hardware. No evidence of discitis or osteomyelitis is identified. Vertebral body height and alignment are maintained. Descending nerve roots appear clumped  from L3-4 to mid L5 suggestive of arachnoiditis. Imaged intra-abdominal contents are unremarkable. The T9-10 to T11-12 levels are imaged in the sagittal plane only. The central canal and foramina appear open. The T12-L1 to L2-3 levels are obscured on axial imaging by artifact from hardware. L3-4: Status post fusion. The central canal and foramina appear open. L4-5: Status post laminectomy and fusion. The central canal and foramina appear open. L5-S1: The patient has a new right laminotomy defect. There is a fluid collection in the surgical bed measuring approximately 3.7 cm AP by 1.1 cm transverse by 2.3 cm craniocaudal. Fluid extends into the posterior subcutaneous tissues. Posterior to the L5 vertebral body, there is a focus most compatible with a disc protrusion measuring 2.4 cm transverse by 1.1 cm AP x 2.1 cm craniocaudal. The central canal appears decompressed and the disc is smaller than on the prior exam. The foramina are open. IMPRESSION: Interval right laminotomy at L5-S1 for discectomy. Findings compatible with a disc protrusion are identified. The protrusion appears smaller than on the prior examination and the central canal and foramina are  decompressed. Fluid collection in the laminectomy bed extending into the subcutaneous soft tissues cannot be definitively characterized but is likely a postoperative seroma given the patient's recent surgery. Status post T10-L5 fusion. The central canal and foramina appear open at these levels although multiple levels are obscured by artifact. 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 Electronically Signed   By: Inge Rise M.D.   On: 07/21/2015 16:50   Portable Chest Xray  07/21/2015  CLINICAL DATA:  Hypoxia EXAM: PORTABLE CHEST 1 VIEW COMPARISON:  July 21, 2015 FINDINGS: Endotracheal tube tip is 4.4 cm above the carina. Nasogastric tube tip and side port are below the diaphragm. Central catheter tip is in the  superior vena cava. No pneumothorax. There is persistent pulmonary vascular congestion. There is increase in opacity in the left base region. There is postoperative change in the lower thoracic and visualized upper lumbar region. IMPRESSION: Tube and catheter positions as described without pneumothorax. Increased consolidation left base ; question developing pneumonia or possibly aspiration. Pulmonary vascular congestion with a degree of congestive heart failure remains without change. Electronically Signed   By: Lowella Grip III M.D.   On: 07/21/2015 11:14   STUDIES:  2/4: CT chest images showed no evidence of a pulmonary embolism about basilar atelectasis most likely. ECHO 2/7: EF 65-70%; indeterminate of diastolic fxn, Peak PAP 32 mmHg 2/8:MRI brain & spine>>> brain unremarkable; fluid collection in laminectomy (post-op?) 2/8: EEG >>  CULTURES: BCX2 2/8>>> Respiratory culture 2/8>>> PCP 2/8>>>  ANTIBIOTICS: vanc 2/4>>> Cefepime 2/5>>> Azithromycin 2/9>>  SIGNIFICANT EVENTS: 2/2: lumbar surgery 2/4: Admitted for PNA 2/8: CCM, intubated, febrile, hypoxemia  LINES/TUBES: Oett 2/8>>> PICC RUE 2/5>>> PIV Foley  DISCUSSION: 68 yom s/p recent lumbar surgery. Admitted 2/4 w/ delirium and working dx of sepsis in setting of PNA. Course c/b AF w/ RVR, intermittent fevers and respiratory failure in spite of ABX. PCCM asked to see on 2/8 for worsening agitation, respiratory failure and distress.   ASSESSMENT / PLAN:  PULMONARY A: Acute Hypoxic Respiratory Failure Pulmonary infiltrates.  D-dx include PNA/ evolving ALI vs Pulmonary edema. He is on immunosuppression at baseline so need to consider opportunistic infection.  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  R/o PNA - ddx- aspiration, hap, opportunistic infection Had been treating w/ working dx of PNA and also possible UTI w/ multi-org UC.  Also consider recent lumbar surgery-->need to consider abscess although surgical site looks clean  P:   Continue Abx (Vanc/cefepime) Added on Azithromycin for opportunistic coverage Pending BAL and PCP Will consult ID Lactic  acid wnl Collect urine legionella and strep pneumoniae  ENDOCRINE A:   DM w/ hyperglycemia on steriods P:   SSI Insulin drip Steriods 50mg  q6hrs - steroid burst (on chronic steroids at home for RA and PMR)  NEUROLOGIC A:   Acute Encephalopathy - delirious on admission Recent L spine surgery - post-op day #7 P:   RASS goal: -3 for now given extensive diagnostics. Then change to -1 as able PAD protocol  EEG pending Neurosurgery following; will call and ask them to review MRI Possible lumbar puncture but hold off   FAMILY  - Updates: Daughter updated at bedside by Dr. Vaughan Browner. - Inter-disciplinary family meet or Palliative Care meeting due by:  2/18  Luiz Blare, DO 07/22/2015, 8:39 AM PGY-2, [facility]

## 2015-07-22 NOTE — Consult Note (Signed)
[facility]  Total days of antibiotics 6        Day 6 vanco        Day 5 cefepime        Day 1 azithromycin       Reason for Consult: pneumonia in immunocompromised host   Referring Physician:  mannam  Principal Problem:   Sepsis ([place]) Active Problems:   Essential hypertension, benign   Coronary atherosclerosis   Rheumatoid arthritis ([place])   Acute encephalopathy   LLL pneumonia   Type II or unspecified type diabetes mellitus without mention of complication, not stated as uncontrolled   S/P lumbar laminectomy   Elevated troponin   Acute respiratory failure with hypoxia (HCC)   Atrial fibrillation with RVR (HCC)   Acute pulmonary edema (HCC)   HCAP (healthcare-associated pneumonia)   Palliative care encounter   DNR (do not resuscitate) discussion   Acute respiratory failure (HCC)    HPI: Curtis Frazier is a 75 y.o. male  with recent Lumbar Laminectomy 2 days ago, history of CAD, HTN, DM2, Hyperlipidemia, and RA  who presents to the ED  On 2/4 with AMS, and Fever to 102. His family reports that he was not acting right. He has had non-productive cough, and SOB. He was found to have hypoxia to 86% and was placed on NCO2 and improved to 96%.he was placed on empiric antibiotics for vancomycin and cefepime. Chest X-ray showed possible early LLL pneumonia.he continued to have worsening respiratory distress requiring intubation in addition to have afib with rvr. He underwent bronchoscopy yesterday for further specimens sent including fungal, pcp, afb, and viral culture. Urine legionella is pending. He has had mri of brain which was unremarkable. He also had mri of lumbar spine that showed evidence of L5 hemi- laminectomy and epidural fluid collection which is likely csf. Dr. Arnoldo Morale did not feel that at this time it is worth sampling. His last high fever was the morning of 2/08 at 103F. He was worsening leukocytosis going up to wbc 13.7 up from 7. Azithromycin  added today for empiric legionella coverage. He maintains to be on pressors and insulin drip.  Past Medical History  Diagnosis Date  . Other and unspecified hyperlipidemia   . Coronary atherosclerosis of unspecified type of vessel, native or graft   . Personal history of colonic polyps   . Actinic keratosis   . 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 Other psoriasis   . Scoliosis (and kyphoscoliosis), idiopathic   . GI bleed 12/10    cecam AVM  . Gastritis 12/10  . Hyperlipidemia     takes Pravastatin daily  . Hypertension     takes Metoprolol daily  . Myocardial infarction ([place]) 2010  . Shortness of breath     with exertion  . Pneumonia     hx of-2008  . Headache(784.0)     occasionally  . Joint pain   . Joint swelling   . Rheumatoid arthritis(714.0)     takes Methotrexate 7pills weekly  . Degeneration of intervertebral disc, site unspecified   . Chronic back pain     scoliosis/stenosis/radiculopathy,degenerative disc disease  . Psoriasis   . Bruises easily   . Esophageal reflux     takes Omeprazole daily  . History of colonic polyps   . Hemorrhoids   . Kidney stone     hx of  . Blood transfusion 2010  . Type II or unspecified type diabetes mellitus without mention of  complication, not stated as uncontrolled     type II;controlled by diet and exercise  . PMR (polymyalgia rheumatica) (HCC) 01/20/2012    tx by specialist on a long prednisone taper  . S/P PTCA (percutaneous transluminal coronary angioplasty)     Allergies:  Allergies  Allergen Reactions  . Ace Inhibitors Other (See Comments)    Causes high K  . Angiotensin Receptor Blockers Other (See Comments)    Causes high K  . Metoprolol Other (See Comments)    May cause elevated K level  . Penicillins Swelling and Rash  . Tetracycline Swelling and Rash    Current antibiotics:   MEDICATIONS: . antiseptic oral rinse  7 mL Mouth Rinse QID  . azithromycin  500 mg Intravenous Q24H  . ceFEPime (MAXIPIME) IV  2 g  Intravenous Q24H  . chlorhexidine gluconate  15 mL Mouth Rinse BID  . folic acid  1 mg Per Tube Daily  . hydrocortisone sod succinate (SOLU-CORTEF) inj  50 mg Intravenous Q6H  . levalbuterol  0.63 mg Nebulization Q6H  . pantoprazole (PROTONIX) IV  40 mg Intravenous Daily  . potassium phosphate IVPB (mmol)  30 mmol Intravenous Once  . sodium chloride flush  10-40 mL Intracatheter Q12H  . vancomycin  750 mg Intravenous Q12H    Social History  Substance Use Topics  . Smoking status: Former Research scientist (life sciences)  . Smokeless tobacco: Never Used     Comment: quit 2010  . Alcohol Use: 0.0 oz/week    0 Standard drinks or equivalent per week     Comment: occasional    Family History  Problem Relation Age of Onset  . Coronary artery disease Mother   . Diabetes Mother   . CAD Mother   . Coronary artery disease Sister   . Hypertension Sister   . Kidney failure Sister   . CAD Sister   . Diabetes Brother   . Prostate cancer Brother   . Pancreatic cancer Brother   . Diabetes Brother   . Anesthesia problems Neg Hx   . Hypotension Neg Hx   . Malignant hyperthermia Neg Hx   . Pseudochol deficiency Neg Hx    Unable to obtain due to sedation  OBJECTIVE: Temp:  [97.8 F (36.6 C)-98.5 F (36.9 C)] 98.5 F (36.9 C) (02/09 1146) Pulse Rate:  [57-129] 103 (02/09 1300) Resp:  [5-48] 25 (02/09 1300) BP: (68-144)/(49-102) 95/66 mmHg (02/09 1245) SpO2:  [93 %-100 %] 95 % (02/09 1300) FiO2 (%):  [50 %-100 %] 50 % (02/09 1235) Weight:  [206 lb 9.1 oz (93.7 kg)] 206 lb 9.1 oz (93.7 kg) (02/09 0351) Physical Exam  Constitutional: He is intubated and sedated. He appears well-developed and well-nourished. No distress.  HENT: OTT in place Mouth/Throat: Oropharynx is clear and moist. No oropharyngeal exudate.  Cardiovascular: Normal rate, regular rhythm and normal heart sounds. Exam reveals no gallop and no friction rub.  No murmur heard.  Pulmonary/Chest: Effort normal and breath sounds normal. Course  rhonchi heard throughout Abdominal: Soft. Bowel sounds are normal. He exhibits no distension. There is no tenderness.  Skin: Skin is warm and dry. No rash noted. No erythema.    LABS: Results for orders placed or performed during the hospital encounter of 07/17/15 (from the past 48 hour(s))  Vancomycin, trough     Status: None   Collection Time: 07/20/15  5:41 PM  Result Value Ref Range   Vancomycin Tr 19 10.0 - 20.0 ug/mL  Glucose, capillary     Status:  Abnormal   Collection Time: 07/20/15  5:43 PM  Result Value Ref Range   Glucose-Capillary 178 (H) 65 - 99 mg/dL  Heparin level (unfractionated)     Status: Abnormal   Collection Time: 07/20/15  8:00 PM  Result Value Ref Range   Heparin Unfractionated 0.17 (L) 0.30 - 0.70 IU/mL    Comment:        IF HEPARIN RESULTS ARE BELOW EXPECTED VALUES, AND PATIENT DOSAGE HAS BEEN CONFIRMED, SUGGEST FOLLOW UP TESTING OF ANTITHROMBIN III LEVELS.   Glucose, capillary     Status: Abnormal   Collection Time: 07/20/15  9:14 PM  Result Value Ref Range   Glucose-Capillary 210 (H) 65 - 99 mg/dL  Procalcitonin     Status: None   Collection Time: 07/21/15  3:30 AM  Result Value Ref Range   Procalcitonin 1.93 ng/mL    Comment:        Interpretation: PCT > 0.5 ng/mL and <= 2 ng/mL: Systemic infection (sepsis) is possible, but other conditions are known to elevate PCT as well. (NOTE)         ICU PCT Algorithm               Non ICU PCT Algorithm    ----------------------------     ------------------------------         PCT < 0.25 ng/mL                 PCT < 0.1 ng/mL     Stopping of antibiotics            Stopping of antibiotics       strongly encouraged.               strongly encouraged.    ----------------------------     ------------------------------       PCT level decrease by               PCT < 0.25 ng/mL       >= 80% from peak PCT       OR PCT 0.25 - 0.5 ng/mL          Stopping of antibiotics                                              encouraged.     Stopping of antibiotics           encouraged.    ----------------------------     ------------------------------       PCT level decrease by              PCT >= 0.25 ng/mL       < 80% from peak PCT        AND PCT >= 0.5 ng/mL             Continuing antibiotics                                              encouraged.       Continuing antibiotics            encouraged.    ----------------------------     ------------------------------     PCT level increase compared          PCT >  0.5 ng/mL         with peak PCT AND          PCT >= 0.5 ng/mL             Escalation of antibiotics                                          strongly encouraged.      Escalation of antibiotics        strongly encouraged.   Basic metabolic panel     Status: Abnormal   Collection Time: 07/21/15  3:30 AM  Result Value Ref Range   Sodium 136 135 - 145 mmol/L   Potassium 3.6 3.5 - 5.1 mmol/L   Chloride 102 101 - 111 mmol/L   CO2 22 22 - 32 mmol/L   Glucose, Bld 210 (H) 65 - 99 mg/dL   BUN 17 6 - 20 mg/dL   Creatinine, Ser 1.40 (H) 0.61 - 1.24 mg/dL   Calcium 8.0 (L) 8.9 - 10.3 mg/dL   GFR calc non Af Amer 48 (L) >60 mL/min   GFR calc Af Amer 55 (L) >60 mL/min    Comment: (NOTE) The eGFR has been calculated using the CKD EPI equation. This calculation has not been validated in all clinical situations. eGFR's persistently <60 mL/min signify possible Chronic Kidney Disease.    Anion gap 12 5 - 15  CBC     Status: Abnormal   Collection Time: 07/21/15  3:30 AM  Result Value Ref Range   WBC 7.3 4.0 - 10.5 K/uL   RBC 2.91 (L) 4.22 - 5.81 MIL/uL   Hemoglobin 9.1 (L) 13.0 - 17.0 g/dL   HCT 27.4 (L) 39.0 - 52.0 %   MCV 94.2 78.0 - 100.0 fL   MCH 31.3 26.0 - 34.0 pg   MCHC 33.2 30.0 - 36.0 g/dL   RDW 16.1 (H) 11.5 - 15.5 %   Platelets 176 150 - 400 K/uL  Heparin level (unfractionated)     Status: None   Collection Time: 07/21/15  3:30 AM  Result Value Ref Range   Heparin Unfractionated  0.31 0.30 - 0.70 IU/mL    Comment:        IF HEPARIN RESULTS ARE BELOW EXPECTED VALUES, AND PATIENT DOSAGE HAS BEEN CONFIRMED, SUGGEST FOLLOW UP TESTING OF ANTITHROMBIN III LEVELS.   Blood gas, arterial     Status: Abnormal   Collection Time: 07/21/15  6:45 AM  Result Value Ref Range   FIO2 0.40    Delivery systems BILEVEL POSITIVE AIRWAY PRESSURE    Inspiratory PAP 14    Expiratory PAP 8    pH, Arterial 7.476 (H) 7.350 - 7.450   pCO2 arterial 26.6 (L) 35.0 - 45.0 mmHg   pO2, Arterial 77.4 (L) 80.0 - 100.0 mmHg   Bicarbonate 19.4 (L) 20.0 - 24.0 mEq/L   TCO2 20.2 0 - 100 mmol/L   Acid-base deficit 3.6 (H) 0.0 - 2.0 mmol/L   O2 Saturation 95.2 %   Patient temperature 98.6    Collection site LEFT RADIAL    Drawn by [id number]    Sample type ARTERIAL DRAW    Allens test (pass/fail) PASS PASS  Glucose, capillary     Status: Abnormal   Collection Time: 07/21/15  6:50 AM  Result Value Ref Range   Glucose-Capillary 209 (H) 65 - 99 mg/dL  Glucose, capillary  Status: Abnormal   Collection Time: 07/21/15  9:31 AM  Result Value Ref Range   Glucose-Capillary 248 (H) 65 - 99 mg/dL  Culture, respiratory (NON-Expectorated)     Status: None (Preliminary result)   Collection Time: 07/21/15  9:50 AM  Result Value Ref Range   Specimen Description BRONCHIAL ALVEOLAR LAVAGE    Special Requests Normal    Gram Stain      RARE WBC PRESENT, PREDOMINANTLY PMN NO SQUAMOUS EPITHELIAL CELLS SEEN NO ORGANISMS SEEN Performed at [facility]    Culture      NO GROWTH 1 DAY Performed at [facility]    Report Status PENDING   Body fluid cell count with differential     Status: Abnormal   Collection Time: 07/21/15  9:50 AM  Result Value Ref Range   Fluid Type-FCT BRONCHIAL ALVEOLAR LAVAGE    Color, Fluid PINK (A) YELLOW   Appearance, Fluid CLOUDY (A) CLEAR   WBC, Fluid 118 0 - 1000 cu mm   Neutrophil Count, Fluid 33 (H) 0 - 25 %   Lymphs, Fluid 17 %    Monocyte-Macrophage-Serous Fluid 50 50 - 90 %   Eos, Fluid 0 %   Other Cells, Fluid MESOTHELIAL CELLS PRESENT %  Triglycerides     Status: None   Collection Time: 07/21/15 10:24 AM  Result Value Ref Range   Triglycerides 127 <150 mg/dL  Heparin level (unfractionated)     Status: Abnormal   Collection Time: 07/21/15 10:30 AM  Result Value Ref Range   Heparin Unfractionated 0.27 (L) 0.30 - 0.70 IU/mL    Comment:        IF HEPARIN RESULTS ARE BELOW EXPECTED VALUES, AND PATIENT DOSAGE HAS BEEN CONFIRMED, SUGGEST FOLLOW UP TESTING OF ANTITHROMBIN III LEVELS.   AFB culture with smear (NOT at [facility])     Status: None (Preliminary result)   Collection Time: 07/21/15 10:34 AM  Result Value Ref Range   Specimen Description BRONCHIAL ALVEOLAR LAVAGE    Special Requests Normal    Acid Fast Smear      NO ACID FAST BACILLI SEEN Performed at [facility]    Culture      CULTURE WILL BE EXAMINED FOR 6 WEEKS BEFORE ISSUING A FINAL REPORT Performed at [facility]    Report Status PENDING   Fungus Culture with Smear     Status: None (Preliminary result)   Collection Time: 07/21/15 10:34 AM  Result Value Ref Range   Specimen Description BRONCHIAL ALVEOLAR LAVAGE    Special Requests Normal    Fungal Smear      NO YEAST OR FUNGAL ELEMENTS SEEN Performed at [facility]    Culture      CULTURE IN PROGRESS FOR FOUR WEEKS Performed at [facility]    Report Status PENDING   Pneumocystis smear by DFA     Status: None   Collection Time: 07/21/15 10:34 AM  Result Value Ref Range   Specimen Source-[facility] BRONCHIAL ALVEOLAR LAVAGE    Pneumocystis jiroveci Ag NEGATIVE     Comment: Performed at [facility]  Lactic acid, plasma     Status: None   Collection Time: 07/21/15 10:45 AM  Result Value Ref Range   Lactic Acid, Venous 1.2 0.5 - 2.0 mmol/L  I-STAT 3, arterial blood gas (G3+)     Status: Abnormal   Collection Time: 07/21/15 11:23 AM  Result  Value Ref Range   pH, Arterial 7.359  7.350 - 7.450   pCO2 arterial 36.4 35.0 - 45.0 mmHg   pO2, Arterial 94.0 80.0 - 100.0 mmHg   Bicarbonate 20.5 20.0 - 24.0 mEq/L   TCO2 22 0 - 100 mmol/L   O2 Saturation 97.0 %   Acid-base deficit 4.0 (H) 0.0 - 2.0 mmol/L   Patient temperature 98.6 F    Collection site RADIAL, ALLEN'S TEST ACCEPTABLE    Drawn by Operator    Sample type ARTERIAL   Glucose, capillary     Status: Abnormal   Collection Time: 07/21/15 12:20 PM  Result Value Ref Range   Glucose-Capillary 335 (H) 65 - 99 mg/dL  Glucose, capillary     Status: Abnormal   Collection Time: 07/21/15  5:21 PM  Result Value Ref Range   Glucose-Capillary 221 (H) 65 - 99 mg/dL  Glucose, capillary     Status: Abnormal   Collection Time: 07/21/15  7:59 PM  Result Value Ref Range   Glucose-Capillary 267 (H) 65 - 99 mg/dL  Glucose, capillary     Status: Abnormal   Collection Time: 07/21/15 11:27 PM  Result Value Ref Range   Glucose-Capillary 221 (H) 65 - 99 mg/dL  Heparin level (unfractionated)     Status: None   Collection Time: 07/21/15 11:40 PM  Result Value Ref Range   Heparin Unfractionated 0.69 0.30 - 0.70 IU/mL    Comment:        IF HEPARIN RESULTS ARE BELOW EXPECTED VALUES, AND PATIENT DOSAGE HAS BEEN CONFIRMED, SUGGEST FOLLOW UP TESTING OF ANTITHROMBIN III LEVELS.   Glucose, capillary     Status: Abnormal   Collection Time: 07/22/15  3:36 AM  Result Value Ref Range   Glucose-Capillary 240 (H) 65 - 99 mg/dL  Glucose, capillary     Status: Abnormal   Collection Time: 07/22/15  4:52 AM  Result Value Ref Range   Glucose-Capillary 259 (H) 65 - 99 mg/dL  Heparin level (unfractionated)     Status: Abnormal   Collection Time: 07/22/15  5:00 AM  Result Value Ref Range   Heparin Unfractionated 0.78 (H) 0.30 - 0.70 IU/mL    Comment:        IF HEPARIN RESULTS ARE BELOW EXPECTED VALUES, AND PATIENT DOSAGE HAS BEEN CONFIRMED, SUGGEST FOLLOW UP TESTING OF ANTITHROMBIN III LEVELS.     Glucose, capillary     Status: Abnormal   Collection Time: 07/22/15  5:54 AM  Result Value Ref Range   Glucose-Capillary 258 (H) 65 - 99 mg/dL  Glucose, capillary     Status: Abnormal   Collection Time: 07/22/15  6:54 AM  Result Value Ref Range   Glucose-Capillary 252 (H) 65 - 99 mg/dL  Glucose, capillary     Status: Abnormal   Collection Time: 07/22/15  7:51 AM  Result Value Ref Range   Glucose-Capillary 240 (H) 65 - 99 mg/dL  CBC     Status: Abnormal   Collection Time: 07/22/15  8:24 AM  Result Value Ref Range   WBC 13.7 (H) 4.0 - 10.5 K/uL   RBC 3.07 (L) 4.22 - 5.81 MIL/uL   Hemoglobin 10.0 (L) 13.0 - 17.0 g/dL   HCT 29.1 (L) 39.0 - 52.0 %   MCV 94.8 78.0 - 100.0 fL   MCH 32.6 26.0 - 34.0 pg   MCHC 34.4 30.0 - 36.0 g/dL   RDW 16.6 (H) 11.5 - 15.5 %   Platelets 216 150 - 400 K/uL  Basic metabolic panel     Status: Abnormal  Collection Time: 07/22/15  8:24 AM  Result Value Ref Range   Sodium 133 (L) 135 - 145 mmol/L   Potassium 3.4 (L) 3.5 - 5.1 mmol/L   Chloride 102 101 - 111 mmol/L   CO2 20 (L) 22 - 32 mmol/L   Glucose, Bld 295 (H) 65 - 99 mg/dL   BUN 22 (H) 6 - 20 mg/dL   Creatinine, Ser 1.65 (H) 0.61 - 1.24 mg/dL   Calcium 7.3 (L) 8.9 - 10.3 mg/dL   GFR calc non Af Amer 39 (L) >60 mL/min   GFR calc Af Amer 45 (L) >60 mL/min    Comment: (NOTE) The eGFR has been calculated using the CKD EPI equation. This calculation has not been validated in all clinical situations. eGFR's persistently <60 mL/min signify possible Chronic Kidney Disease.    Anion gap 11 5 - 15  Magnesium     Status: Abnormal   Collection Time: 07/22/15  8:24 AM  Result Value Ref Range   Magnesium 1.4 (L) 1.7 - 2.4 mg/dL  Phosphorus     Status: Abnormal   Collection Time: 07/22/15  8:24 AM  Result Value Ref Range   Phosphorus 2.4 (L) 2.5 - 4.6 mg/dL  Glucose, capillary     Status: Abnormal   Collection Time: 07/22/15  9:11 AM  Result Value Ref Range   Glucose-Capillary 207 (H) 65 - 99 mg/dL   Glucose, capillary     Status: Abnormal   Collection Time: 07/22/15 10:16 AM  Result Value Ref Range   Glucose-Capillary 188 (H) 65 - 99 mg/dL    MICRO: 2/8 BAL - no fungal, afb seen IMAGING: Mr Kizzie Fantasia Contrast  07/21/2015  CLINICAL DATA:  75 year old male with recent back surgery. Fever, confusion, sepsis. Intubated. Initial encounter. EXAM: MRI HEAD WITHOUT AND WITH CONTRAST TECHNIQUE: Multiplanar, multiecho pulse sequences of the brain and surrounding structures were obtained without and with intravenous contrast. CONTRAST:  18m MULTIHANCE GADOBENATE DIMEGLUMINE 529 MG/ML IV SOLN COMPARISON:  Head CT without contrast 07/17/2015. FINDINGS: No restricted diffusion to suggest acute infarction. No midline shift, mass effect, evidence of mass lesion, ventriculomegaly, extra-axial collection or acute intracranial hemorrhage. Cervicomedullary junction and pituitary are within normal limits. Negative visualized cervical spine. Major intracranial vascular flow voids are preserved. GCarmickleand white matter signal is within normal limits for age throughout the brain. No cortical encephalomalacia or chronic cerebral blood products. No abnormal enhancement identified. No dural thickening. Intubated. Small volume fluid in the pharynx. Right greater than left mastoid effusions. Right greater than left paranasal sinus fluid and opacification. Other Visible internal auditory structures appear normal. Postoperative changes to both globes. Negative orbit and scalp soft tissues. Normal bone marrow signal. IMPRESSION: 1. Negative for age MRI appearance of the brain. 2. Mastoid and paranasal sinus fluid/inflammation in the setting of intubation. Electronically Signed   By: HGenevie AnnM.D.   On: 07/21/2015 16:41   Mr Lumbar Spine W Wo Contrast  07/21/2015  CLINICAL DATA:  Status post lumbar laminectomy 2 days ago. History of rheumatoid arthritis. Fever and sepsis. Question infection. EXAM: MRI LUMBAR SPINE WITHOUT AND WITH  CONTRAST TECHNIQUE: Multiplanar and multiecho pulse sequences of the lumbar spine were obtained without and with intravenous contrast. CONTRAST:  20 cc MultiHance IV. COMPARISON:  MRI lumbar spine 06/09/2015. Single intraoperative view of the lumbar spine 07/15/2015. FINDINGS: The patient is status post T10-L5 fusion with pedicle screws and stabilization bars in place. Extensive artifact from hardware. No evidence of discitis or osteomyelitis  is identified. Vertebral body height and alignment are maintained. Descending nerve roots appear clumped from L3-4 to mid L5 suggestive of arachnoiditis. Imaged intra-abdominal contents are unremarkable. The T9-10 to T11-12 levels are imaged in the sagittal plane only. The central canal and foramina appear open. The T12-L1 to L2-3 levels are obscured on axial imaging by artifact from hardware. L3-4: Status post fusion. The central canal and foramina appear open. L4-5: Status post laminectomy and fusion. The central canal and foramina appear open. L5-S1: The patient has a new right laminotomy defect. There is a fluid collection in the surgical bed measuring approximately 3.7 cm AP by 1.1 cm transverse by 2.3 cm craniocaudal. Fluid extends into the posterior subcutaneous tissues. Posterior to the L5 vertebral body, there is a focus most compatible with a disc protrusion measuring 2.4 cm transverse by 1.1 cm AP x 2.1 cm craniocaudal. The central canal appears decompressed and the disc is smaller than on the prior exam. The foramina are open. IMPRESSION: Interval right laminotomy at L5-S1 for discectomy. Findings compatible with a disc protrusion are identified. The protrusion appears smaller than on the prior examination and the central canal and foramina are decompressed. Fluid collection in the laminectomy bed extending into the subcutaneous soft tissues cannot be definitively characterized but is likely a postoperative seroma given the patient's recent surgery. Status post  T10-L5 fusion. The central canal and foramina appear open at these levels although multiple levels are obscured by artifact. 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 Electronically Signed   By: Inge Rise M.D.   On: 07/21/2015 16:50   Dg Chest Port 1 View  07/22/2015  CLINICAL DATA:  Ventilator dependent respiratory failure, coronary artery disease, sepsis, acute encephalopathy, left lower lobe pneumonia. EXAM: PORTABLE CHEST 1 VIEW COMPARISON:  Portable chest x-ray of July 21, 2015 FINDINGS: The lungs are reasonably well inflated. There are bilateral pleural effusions layering posteriorly. There is left lower lobe atelectasis or pneumonia. The cardiac silhouette remains enlarged. The pulmonary vascularity remains engorged. The pulmonary interstitial markings are slightly more conspicuous overall today. The endotracheal tube tip lies 4.7 cm above the carina. The esophagogastric tube tip projects below the inferior margin of the image. The PICC line tip projects over the midportion of the SVC. The patient has undergone lower thoracic lumbar an upper lumbar posterior fusion. IMPRESSION: CHF with mild pulmonary interstitial edema slightly more conspicuous today. Persistent left lower lobe atelectasis or pneumonia. Persistent bilateral pleural effusions layering posteriorly. The support tubes are in reasonable position. Electronically Signed   By: David  Martinique M.D.   On: 07/22/2015 08:33   Portable Chest Xray  07/21/2015  CLINICAL DATA:  Hypoxia EXAM: PORTABLE CHEST 1 VIEW COMPARISON:  July 21, 2015 FINDINGS: Endotracheal tube tip is 4.4 cm above the carina. Nasogastric tube tip and side port are below the diaphragm. Central catheter tip is in the superior vena cava. No pneumothorax. There is persistent pulmonary vascular congestion. There is increase in opacity in the left base region. There is postoperative change in the lower thoracic and visualized  upper lumbar region. IMPRESSION: Tube and catheter positions as described without pneumothorax. Increased consolidation left base ; question developing pneumonia or possibly aspiration. Pulmonary vascular congestion with a degree of congestive heart failure remains without change. Electronically Signed   By: Lowella Grip III M.D.   On: 07/21/2015 11:14   Dg Chest Port 1 View  07/21/2015  CLINICAL DATA:  Decreased oxygen saturation EXAM: PORTABLE CHEST  1 VIEW COMPARISON:  July 19, 2015 FINDINGS: Central catheter tip is in the superior vena cava. No pneumothorax. Interstitial edema remains, stable. There is no airspace consolidation. There is cardiomegaly with mild pulmonary venous hypertension. No new opacity. No adenopathy. There is postoperative change in the lower thoracic and visualized upper lumbar region. IMPRESSION: Evidence of a degree of congestive heart failure, stable. No new opacity. No change in cardiac silhouette. Central catheter tip in superior vena cava. No pneumothorax evident. Electronically Signed   By: Lowella Grip III M.D.   On: 07/21/2015 07:52   Assessment/Plan:  75yo M with history of RA, CAD, who presents with fever, respiratory distress now intubated with little response to HCAP coverage  - agree with addition of azithromycin for legionella coverage. Await for urine antigen testing - in the meantime continue with vancomycin and cefepime - RVP panel is pending to see if this could be viral pneumonia - await for some results to come back before changing course. If has recurrent fevers or worsening vent requirement, may consider expanding cefepime to meropenem

## 2015-07-22 NOTE — Progress Notes (Signed)
EEG completed, results pending. 

## 2015-07-22 NOTE — Progress Notes (Signed)
ANTICOAGULATION CONSULT NOTE  Pharmacy Consult for heparin Indication: atrial fibrillation  Patient Measurements: Height: 5\' 8"  (172.7 cm) Weight: 206 lb 9.1 oz (93.7 kg) IBW/kg (Calculated) : 68.4 Heparin Dosing Weight: 87.8 kg  Vital Signs: Temp: 97.8 F (36.6 C) (02/09 0400) Temp Source: Oral (02/09 0400) BP: 88/75 mmHg (02/09 0600) Pulse Rate: 108 (02/09 0600)  Labs:  Recent Labs  07/20/15 0400  07/21/15 0330 07/21/15 1030 07/21/15 2340 07/22/15 0500  HGB 8.7*  --  9.1*  --   --   --   HCT 26.8*  --  27.4*  --   --   --   PLT 158  --  176  --   --   --   HEPARINUNFRC  --   < > 0.31 0.27* 0.69 0.78*  CREATININE 1.62*  --  1.40*  --   --   --   < > = values in this interval not displayed.  Estimated Creatinine Clearance: 49.8 mL/min (by C-G formula based on Cr of 1.4).  Assessment: 75 yo m on heparin for afib. Pt's plavix has been stopped and starting heparin was ok'ed by neurosurg. CHADSVASc = 5. Hgb 9.1, Plts 176. Heparin level now elevated to 0.78 (supratherapeutic) on 1900 units/hr. No bleeding noted.  Goal of Therapy:  Heparin level 0.3-0.7 units/ml Monitor platelets by anticoagulation protocol: Yes   Plan:  Decrease heparin infusion to 1750 units/hr F/u 8 hr heparin level   Sherlon Handing, PharmD, BCPS Clinical pharmacist, pager [phone] 07/22/2015 6:25 AM

## 2015-07-22 NOTE — Progress Notes (Signed)
[facility] Progress Note Patient Name: Curtis Frazier DOB: 1941/03/27 MRN: [id number]   Date of Service  07/22/2015  HPI/Events of Note  AFIB with RVR. HR = 130's to 140's. BP = 101/76.  eICU Interventions  Will order: 1. Amiodarone bolus and IV infusion.  2. Mg++ level and BMP now.      Intervention Category Major Interventions: Arrhythmia - evaluation and management  Aitan Rossbach Cornelia Copa 07/22/2015, 8:37 PM

## 2015-07-22 NOTE — Procedures (Signed)
ELECTROENCEPHALOGRAM REPORT   Patient: Curtis Frazier       Age: 75 y.o.        Sex: male Referring Physician: Dr Vaughan Browner Report Date:  07/22/2015        Interpreting Physician: Hulen Luster  History: [name] is an 75 y.o. male hx of altered mental status  Medications:  I have reviewed the patient's current medications.  Conditions of Recording:  This is a 16 channel EEG carried out with the patient in the intubated and sedated state.  Description:  The waking background activity consists of a low voltage, symmetrical, fairly well organized, theta activity, no posterior dominant alpha rhythm is noted. No focal slowing or epileptiform activity is noted.  Hyperventilation was not performed. Intermittent photic stimulation was not performed.   IMPRESSION: Abnormal EEG due to the presence of generalized slowing indicating a mild to moderate cerebral disturbance (encephalopathy). No epileptiform activity is noted.    Jim Like, DO [facility] [phone]  If 7pm- 7am, please page neurology on call as listed in AMION. 07/22/2015, 5:05 PM

## 2015-07-22 NOTE — Progress Notes (Signed)
ANTICOAGULATION CONSULT NOTE  Pharmacy Consult for heparin Indication: atrial fibrillation  Patient Measurements: Height: 5\' 8"  (172.7 cm) Weight: 206 lb 9.1 oz (93.7 kg) IBW/kg (Calculated) : 68.4 Heparin Dosing Weight: 87.8 kg  Vital Signs: Temp: 98.5 F (36.9 C) (02/09 1146) Temp Source: Oral (02/09 1146) BP: 126/87 mmHg (02/09 1445) Pulse Rate: 112 (02/09 1445)  Labs:  Recent Labs  07/20/15 0400  07/21/15 0330  07/21/15 2340 07/22/15 0500 07/22/15 0824 07/22/15 1424  HGB 8.7*  --  9.1*  --   --   --  10.0*  --   HCT 26.8*  --  27.4*  --   --   --  29.1*  --   PLT 158  --  176  --   --   --  216  --   HEPARINUNFRC  --   < > 0.31  < > 0.69 0.78*  --  0.66  CREATININE 1.62*  --  1.40*  --   --   --  1.65*  --   < > = values in this interval not displayed.  Estimated Creatinine Clearance: 42.3 mL/min (by C-G formula based on Cr of 1.65).  Assessment: 75 yo m on heparin for afib. Pt's plavix has been stopped and starting heparin was ok'ed by neurosurg. CHADSVASc = 5.   Heparin level 0.66 after decrease to 1750 units/hr. Minor bloody secretions in mouth cavity which RN attributes to trauma with intubation and metal plate in mouth - this has been ongoing since intubation and has not increased.   Goal of Therapy:  Heparin level 0.3-0.7 units/ml Monitor platelets by anticoagulation protocol: Yes   Plan:  Continue heparin infusion at 1750 units/hr F/u repeat 8 hr heparin level to rule out accumulation Monitor for bleeding - RN to call if increased mouth oozing   Sloan Leiter, PharmD, BCPS Clinical Pharmacist [phone]  07/22/2015 2:54 PM

## 2015-07-22 NOTE — Progress Notes (Addendum)
Initial Nutrition Assessment  DOCUMENTATION CODES:   Obesity unspecified  INTERVENTION:    Initiate TF via OGT with Vital High Protein at 15 ml/h and Prostat 60 ml QID to provide 1160 kcals, 152 gm protein, 301 ml free water daily.  TF + Propofol will provide a total of 1379 kcals per day (105% of estimated needs).  NUTRITION DIAGNOSIS:   Inadequate oral intake related to inability to eat as evidenced by NPO status.  GOAL:   Provide needs based on ASPEN/SCCM guidelines  MONITOR:   Vent status, Labs, Weight trends, TF tolerance, I & O's  REASON FOR ASSESSMENT:   Rounds (RD to order TF)    ASSESSMENT:   78 yom who is s/p recent lumbar surg on 2/2. Following d/c on 2/4 developed acute confusion and worsening weakness w/ fever > 102. He was brought to the ER. Admitted w/ working dx of sepsis felt d/t PNA +/- UTI. His hospital course was c/b intermittent fever, AF w/ RVR, hypoxia and confusion. Required transfer to ICU and intubation on 2/8.  Discussed patient in ICU rounds and with RN today. Patient is currently on insulin drip and propofol. Okay to start TF per physician, RD to order. Nutrition focused physical exam completed.  No muscle or subcutaneous fat depletion noticed.  Patient is currently intubated on ventilator support Temp (24hrs), Avg:98.2 F (36.8 C), Min:97.8 F (36.6 C), Max:98.5 F (36.9 C)  Propofol: 8.3 ml/hr providing 219 kcals per day.  Diet Order:  Diet NPO time specified  Skin:  Reviewed, no issues  Last BM:  2/8  Height:   Ht Readings from Last 1 Encounters:  07/21/15 5\' 8"  (1.727 m)    Weight:   Wt Readings from Last 1 Encounters:  07/22/15 206 lb 9.1 oz (93.7 kg)    Ideal Body Weight:  70 kg  BMI:  Body mass index is 31.42 kg/(m^2).  Estimated Nutritional Needs:   Kcal:  [id number]  Protein:  140 gm  Fluid:  1.8-2 L  EDUCATION NEEDS:   No education needs identified at this time  Molli Barrows, [place], [place], [place] Pager  [phone] After Hours Pager [phone]

## 2015-07-22 NOTE — Progress Notes (Signed)
ANTICOAGULATION CONSULT NOTE - Follow Up Consult  Pharmacy Consult for heparin Indication: atrial fibrillation   Labs:  Recent Labs  07/19/15 0230 07/20/15 0400  07/21/15 0330 07/21/15 1030 07/21/15 2340  HGB 8.5* 8.7*  --  9.1*  --   --   HCT 25.0* 26.8*  --  27.4*  --   --   PLT 134* 158  --  176  --   --   HEPARINUNFRC  --   --   < > 0.31 0.27* 0.69  CREATININE 1.92* 1.62*  --  1.40*  --   --   < > = values in this interval not displayed.   Assessment/Plan:  75yo male therapeutic on heparin after resumed. Will continue gtt at current rate and confirm stable with am labs.   Wynona Neat, PharmD, BCPS  07/22/2015,12:13 AM

## 2015-07-22 NOTE — Progress Notes (Signed)
Patient ID: Curtis Frazier, male   DOB: June 16, 1940, 75 y.o.   MRN: [id number] Subjective:  The patient is somnolent but easily arousable. He is in no apparent distress. He is intubated.  Objective: Vital signs in last 24 hours: Temp:  [97.8 F (36.6 C)-98.5 F (36.9 C)] 98.5 F (36.9 C) (02/09 1146) Pulse Rate:  [57-129] 103 (02/09 1300) Resp:  [5-48] 25 (02/09 1300) BP: (68-144)/(49-102) 95/66 mmHg (02/09 1245) SpO2:  [93 %-100 %] 95 % (02/09 1300) FiO2 (%):  [50 %-100 %] 50 % (02/09 1235) Weight:  [93.7 kg (206 lb 9.1 oz)] 93.7 kg (206 lb 9.1 oz) (02/09 0351)  Intake/Output from previous day: 02/08 0701 - 02/09 0700 In: 3912.4 [I.V.:2532.4; NG/GT:30; IV Piggyback:1350] Out: [id number] [Urine:1310; Emesis/NG output:400] Intake/Output this shift: Total I/O In: 928.7 [I.V.:578.7; IV Piggyback:350] Out: 450 [Urine:450]  Physical exam the patient is somnolent but arousable. He moves all 4 extremities to command. He nods appropriately.  The patient's lumbar dressing is clean and dry. There is no discharge.  I reviewed the patient's brain and lumbar MRI performed 07/20/2014. The brain MRI is unremarkable. The patient's lumbar MRI demonstrates at least had a L5 hemilaminectomy. There is epidural fluid likely CSF. I don't see any clear signs of infection such as compressive abscess, etc. His herniated disc displacement smaller.  Lab Results:  Recent Labs  07/21/15 0330 07/22/15 0824  WBC 7.3 13.7*  HGB 9.1* 10.0*  HCT 27.4* 29.1*  PLT 176 216   BMET  Recent Labs  07/21/15 0330 07/22/15 0824  NA 136 133*  K 3.6 3.4*  CL 102 102  CO2 22 20*  GLUCOSE 210* 295*  BUN 17 22*  CREATININE 1.40* 1.65*  CALCIUM 8.0* 7.3*    Studies/Results: Mr Kizzie Fantasia Contrast  07/21/2015  CLINICAL DATA:  75 year old male with recent back surgery. Fever, confusion, sepsis. Intubated. Initial encounter. EXAM: MRI HEAD WITHOUT AND WITH CONTRAST TECHNIQUE: Multiplanar, multiecho pulse sequences of the  brain and surrounding structures were obtained without and with intravenous contrast. CONTRAST:  51mL MULTIHANCE GADOBENATE DIMEGLUMINE 529 MG/ML IV SOLN COMPARISON:  Head CT without contrast 07/17/2015. FINDINGS: No restricted diffusion to suggest acute infarction. No midline shift, mass effect, evidence of mass lesion, ventriculomegaly, extra-axial collection or acute intracranial hemorrhage. Cervicomedullary junction and pituitary are within normal limits. Negative visualized cervical spine. Major intracranial vascular flow voids are preserved. Mctiernan and white matter signal is within normal limits for age throughout the brain. No cortical encephalomalacia or chronic cerebral blood products. No abnormal enhancement identified. No dural thickening. Intubated. Small volume fluid in the pharynx. Right greater than left mastoid effusions. Right greater than left paranasal sinus fluid and opacification. Other Visible internal auditory structures appear normal. Postoperative changes to both globes. Negative orbit and scalp soft tissues. Normal bone marrow signal. IMPRESSION: 1. Negative for age MRI appearance of the brain. 2. Mastoid and paranasal sinus fluid/inflammation in the setting of intubation. Electronically Signed   By: Genevie Ann M.D.   On: 07/21/2015 16:41   Mr Lumbar Spine W Wo Contrast  07/21/2015  CLINICAL DATA:  Status post lumbar laminectomy 2 days ago. History of rheumatoid arthritis. Fever and sepsis. Question infection. EXAM: MRI LUMBAR SPINE WITHOUT AND WITH CONTRAST TECHNIQUE: Multiplanar and multiecho pulse sequences of the lumbar spine were obtained without and with intravenous contrast. CONTRAST:  20 cc MultiHance IV. COMPARISON:  MRI lumbar spine 06/09/2015. Single intraoperative view of the lumbar spine 07/15/2015. FINDINGS: The patient is status post  T10-L5 fusion with pedicle screws and stabilization bars in place. Extensive artifact from hardware. No evidence of discitis or osteomyelitis is  identified. Vertebral body height and alignment are maintained. Descending nerve roots appear clumped from L3-4 to mid L5 suggestive of arachnoiditis. Imaged intra-abdominal contents are unremarkable. The T9-10 to T11-12 levels are imaged in the sagittal plane only. The central canal and foramina appear open. The T12-L1 to L2-3 levels are obscured on axial imaging by artifact from hardware. L3-4: Status post fusion. The central canal and foramina appear open. L4-5: Status post laminectomy and fusion. The central canal and foramina appear open. L5-S1: The patient has a new right laminotomy defect. There is a fluid collection in the surgical bed measuring approximately 3.7 cm AP by 1.1 cm transverse by 2.3 cm craniocaudal. Fluid extends into the posterior subcutaneous tissues. Posterior to the L5 vertebral body, there is a focus most compatible with a disc protrusion measuring 2.4 cm transverse by 1.1 cm AP x 2.1 cm craniocaudal. The central canal appears decompressed and the disc is smaller than on the prior exam. The foramina are open. IMPRESSION: Interval right laminotomy at L5-S1 for discectomy. Findings compatible with a disc protrusion are identified. The protrusion appears smaller than on the prior examination and the central canal and foramina are decompressed. Fluid collection in the laminectomy bed extending into the subcutaneous soft tissues cannot be definitively characterized but is likely a postoperative seroma given the patient's recent surgery. Status post T10-L5 fusion. The central canal and foramina appear open at these levels although multiple levels are obscured by artifact. 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 Electronically Signed   By: Inge Rise M.D.   On: 07/21/2015 16:50   Dg Chest Port 1 View  07/22/2015  CLINICAL DATA:  Ventilator dependent respiratory failure, coronary artery disease, sepsis, acute encephalopathy, left lower lobe  pneumonia. EXAM: PORTABLE CHEST 1 VIEW COMPARISON:  Portable chest x-ray of July 21, 2015 FINDINGS: The lungs are reasonably well inflated. There are bilateral pleural effusions layering posteriorly. There is left lower lobe atelectasis or pneumonia. The cardiac silhouette remains enlarged. The pulmonary vascularity remains engorged. The pulmonary interstitial markings are slightly more conspicuous overall today. The endotracheal tube tip lies 4.7 cm above the carina. The esophagogastric tube tip projects below the inferior margin of the image. The PICC line tip projects over the midportion of the SVC. The patient has undergone lower thoracic lumbar an upper lumbar posterior fusion. IMPRESSION: CHF with mild pulmonary interstitial edema slightly more conspicuous today. Persistent left lower lobe atelectasis or pneumonia. Persistent bilateral pleural effusions layering posteriorly. The support tubes are in reasonable position. Electronically Signed   By: David  Martinique M.D.   On: 07/22/2015 08:33   Portable Chest Xray  07/21/2015  CLINICAL DATA:  Hypoxia EXAM: PORTABLE CHEST 1 VIEW COMPARISON:  July 21, 2015 FINDINGS: Endotracheal tube tip is 4.4 cm above the carina. Nasogastric tube tip and side port are below the diaphragm. Central catheter tip is in the superior vena cava. No pneumothorax. There is persistent pulmonary vascular congestion. There is increase in opacity in the left base region. There is postoperative change in the lower thoracic and visualized upper lumbar region. IMPRESSION: Tube and catheter positions as described without pneumothorax. Increased consolidation left base ; question developing pneumonia or possibly aspiration. Pulmonary vascular congestion with a degree of congestive heart failure remains without change. Electronically Signed   By: Lowella Grip III M.D.   On: 07/21/2015  11:14   Dg Chest Port 1 View  07/21/2015  CLINICAL DATA:  Decreased oxygen saturation EXAM: PORTABLE  CHEST 1 VIEW COMPARISON:  July 19, 2015 FINDINGS: Central catheter tip is in the superior vena cava. No pneumothorax. Interstitial edema remains, stable. There is no airspace consolidation. There is cardiomegaly with mild pulmonary venous hypertension. No new opacity. No adenopathy. There is postoperative change in the lower thoracic and visualized upper lumbar region. IMPRESSION: Evidence of a degree of congestive heart failure, stable. No new opacity. No change in cardiac silhouette. Central catheter tip in superior vena cava. No pneumothorax evident. Electronically Signed   By: Lowella Grip III M.D.   On: 07/21/2015 07:52    Assessment/Plan: Postop day #7: The patient's postoperative scan looks okay. I don't see anything particularly concerning for infection. He did have a spinal fluid leak at surgery so the fluid is likely CSF. I have discussed this with Dr. Gerarda Fraction. The fact that he has had fevers and fluid in his operative field does raise the possibility of infection/meningitis. I however think this is unlikely him a particularly since he has other known sources of fever and he presented with respiratory symptoms and his MRI looks like benign postoperative fluid. I would recommend empiric treatment for his pneumonia. If he doesn't respond then he could have a spinal tap in his upper lumbar spine, i.e. away from the incision to rule out meningitis. I have spoken with the patient's wife and son.  LOS: 5 days     Patrycja Mumpower D 07/22/2015, 1:26 PM

## 2015-07-23 ENCOUNTER — Inpatient Hospital Stay (HOSPITAL_COMMUNITY): Payer: Medicare Other

## 2015-07-23 LAB — CBC
HCT: 25.5 % — ABNORMAL LOW (ref 39.0–52.0)
HEMOGLOBIN: 8.4 g/dL — AB (ref 13.0–17.0)
MCH: 31 pg (ref 26.0–34.0)
MCHC: 32.9 g/dL (ref 30.0–36.0)
MCV: 94.1 fL (ref 78.0–100.0)
Platelets: 252 10*3/uL (ref 150–400)
RBC: 2.71 MIL/uL — AB (ref 4.22–5.81)
RDW: 17 % — ABNORMAL HIGH (ref 11.5–15.5)
WBC: 12.1 10*3/uL — ABNORMAL HIGH (ref 4.0–10.5)

## 2015-07-23 LAB — GLUCOSE, CAPILLARY
GLUCOSE-CAPILLARY: 166 mg/dL — AB (ref 65–99)
GLUCOSE-CAPILLARY: 163 mg/dL — AB (ref 65–99)
GLUCOSE-CAPILLARY: 154 mg/dL — AB (ref 65–99)
GLUCOSE-CAPILLARY: 181 mg/dL — AB (ref 65–99)
GLUCOSE-CAPILLARY: 182 mg/dL — AB (ref 65–99)
GLUCOSE-CAPILLARY: 191 mg/dL — AB (ref 65–99)
GLUCOSE-CAPILLARY: 126 mg/dL — AB (ref 65–99)
GLUCOSE-CAPILLARY: 162 mg/dL — AB (ref 65–99)
GLUCOSE-CAPILLARY: 194 mg/dL — AB (ref 65–99)
Glucose-Capillary: 164 mg/dL — ABNORMAL HIGH (ref 65–99)
Glucose-Capillary: 167 mg/dL — ABNORMAL HIGH (ref 65–99)
Glucose-Capillary: 171 mg/dL — ABNORMAL HIGH (ref 65–99)
Glucose-Capillary: 174 mg/dL — ABNORMAL HIGH (ref 65–99)
Glucose-Capillary: 180 mg/dL — ABNORMAL HIGH (ref 65–99)
Glucose-Capillary: 173 mg/dL — ABNORMAL HIGH (ref 65–99)
Glucose-Capillary: 211 mg/dL — ABNORMAL HIGH (ref 65–99)
Glucose-Capillary: 169 mg/dL — ABNORMAL HIGH (ref 65–99)
Glucose-Capillary: 147 mg/dL — ABNORMAL HIGH (ref 65–99)
Glucose-Capillary: 117 mg/dL — ABNORMAL HIGH (ref 65–99)
Glucose-Capillary: 131 mg/dL — ABNORMAL HIGH (ref 65–99)

## 2015-07-23 LAB — BASIC METABOLIC PANEL
ANION GAP: 10 (ref 5–15)
BUN: 35 mg/dL — ABNORMAL HIGH (ref 6–20)
CHLORIDE: 106 mmol/L (ref 101–111)
CO2: 21 mmol/L — AB (ref 22–32)
Calcium: 7.1 mg/dL — ABNORMAL LOW (ref 8.9–10.3)
Creatinine, Ser: 1.5 mg/dL — ABNORMAL HIGH (ref 0.61–1.24)
GFR calc non Af Amer: 43 mL/min — ABNORMAL LOW (ref >60)
GFR, EST AFRICAN AMERICAN: 50 mL/min — AB (ref >60)
Glucose, Bld: 178 mg/dL — ABNORMAL HIGH (ref 65–99)
Potassium: 3.3 mmol/L — ABNORMAL LOW (ref 3.5–5.1)
SODIUM: 137 mmol/L (ref 135–145)

## 2015-07-23 LAB — LEGIONELLA ANTIGEN, URINE

## 2015-07-23 LAB — MAGNESIUM: Magnesium: 2.4 mg/dL (ref 1.7–2.4)

## 2015-07-23 LAB — PROCALCITONIN: Procalcitonin: 7.58 ng/mL

## 2015-07-23 LAB — CULTURE, RESPIRATORY: CULTURE: NO GROWTH

## 2015-07-23 LAB — HEPARIN LEVEL (UNFRACTIONATED): Heparin Unfractionated: 0.44 IU/mL (ref 0.30–0.70)

## 2015-07-23 LAB — CULTURE, RESPIRATORY W GRAM STAIN: Special Requests: NORMAL

## 2015-07-23 LAB — CMV DNA BY PCR, QUALITATIVE: CMV DNA QL PCR: NEGATIVE

## 2015-07-23 LAB — PHOSPHORUS: Phosphorus: 4.2 mg/dL (ref 2.5–4.6)

## 2015-07-23 MED ORDER — MIDAZOLAM HCL 2 MG/2ML IJ SOLN
1.0000 mg | INTRAMUSCULAR | Status: AC | PRN
  Administered 2015-07-23 – 2015-07-24 (×3): 1 mg via INTRAVENOUS
  Filled 2015-07-23 (×2): qty 2

## 2015-07-23 MED ORDER — MIDAZOLAM HCL 2 MG/2ML IJ SOLN
INTRAMUSCULAR | Status: AC
  Filled 2015-07-23: qty 2

## 2015-07-23 MED ORDER — POTASSIUM CHLORIDE 20 MEQ/15ML (10%) PO SOLN
20.0000 meq | ORAL | Status: AC
  Administered 2015-07-23 (×2): 20 meq
  Filled 2015-07-23 (×2): qty 15

## 2015-07-23 MED ORDER — MIDAZOLAM HCL 2 MG/2ML IJ SOLN
1.0000 mg | INTRAMUSCULAR | Status: DC | PRN
  Administered 2015-07-23 – 2015-07-24 (×3): 1 mg via INTRAVENOUS
  Filled 2015-07-23 (×3): qty 2

## 2015-07-23 NOTE — Progress Notes (Addendum)
Pharmacy Antibiotic and Anticoagulation Note  Curtis Frazier is a 75 y.o. male admitted on 07/17/2015 with sepsis.  Pharmacy has been consulted for vancomycin dosing. Concern for opportunistic infections in this immunocompromised patient.  ID following.   Pharmacy is also consulted to dose heparin for new onset afib. HL therapeutic this AM. Pt had minor bloody secretions overnight but RN attributed to traumatic intubation. CBC - hgb 10>8.4, plts 252 - watch hgb.    Plan: Continue vancomycin 750 mg IV q12h  Continue cefepime 2 mg IV q24h F/u labs, CBC, temp Monitor LOT, abx plans per ID  Continue heparin infusion at 1750 units/hr Daily HL, CBC Monitor s/s of bleeding.   Height: 5\' 8"  (172.7 cm) Weight: 206 lb 12.7 oz (93.8 kg) IBW/kg (Calculated) : 68.4  Temp (24hrs), Avg:98.8 F (37.1 C), Min:97.6 F (36.4 C), Max:100.2 F (37.9 C)   Recent Labs Lab 07/17/15 1759 07/17/15 2022 07/17/15 2349  07/18/15 0247 07/19/15 0230 07/20/15 0400 07/20/15 1741 07/21/15 0330 07/21/15 1045 07/22/15 0824 07/22/15 2015 07/23/15 0448  WBC  --   --   --   < >  --  8.6 6.4  --  7.3  --  13.7*  --  12.1*  CREATININE  --   --   --   < >  --  1.92* 1.62*  --  1.40*  --  1.65* 1.48* 1.50*  LATICACIDVEN 1.14 0.85 1.2  --  1.0  --   --   --   --  1.2  --   --   --   VANCOTROUGH  --   --   --   --   --   --   --  19  --   --   --   --   --   < > = values in this interval not displayed.  Estimated Creatinine Clearance: 46.6 mL/min (by C-G formula based on Cr of 1.5).    Allergies  Allergen Reactions  . Ace Inhibitors Other (See Comments)    Causes high K  . Angiotensin Receptor Blockers Other (See Comments)    Causes high K  . Metoprolol Other (See Comments)    May cause elevated K level  . Penicillins Swelling and Rash  . Tetracycline Swelling and Rash    Antimicrobials this admission: Ampicillin 2/4>>2/5 Ceftriaxone 2/4>>2/4 Cefepime 2/5>> Vanc 2/4 >> Azithromyfin 2/9 >>  Dose  adjustments this admission: 2/7 VT = 19, continue 750 mg IV q12h  Microbiology results: 2/4 MRSA PCR Nasal Neg 2/4 BCx2: neg 2/4 UCx: mult species 2/8 PCP smear: 2/8 CMV pcr: 2/8 Fungal cx: 2/8 AFB cx: 2/8 RSV: 2/8 Bronch:  2/10 strep pneumo neg 2/10 legionella ip  Thank you for allowing pharmacy to be a part of this patient's care.  Cassie L. Nicole Kindred, PharmD PGY2 Infectious Diseases Pharmacy Resident Pager: [phone] 07/23/2015 9:44 AM

## 2015-07-23 NOTE — Progress Notes (Signed)
TELEMETRY: Reviewed telemetry pt in Atrial fibrillation with rate 110: Filed Vitals:   07/23/15 0745 07/23/15 0800 07/23/15 0815 07/23/15 0830  BP: 124/75 118/81 126/74 109/75  Pulse: 112 105 111 122  Temp:      TempSrc:      Resp: 21 25 22 19   Height:      Weight:      SpO2: 94% 95% 94% 93%    Intake/Output Summary (Last 24 hours) at 07/23/15 0956 Last data filed at 07/23/15 0800  Gross per 24 hour  Intake 4347.3 ml  Output   2700 ml  Net 1647.3 ml   Filed Weights   07/21/15 0300 07/22/15 0351 07/23/15 0200  Weight: 92.7 kg (204 lb 5.9 oz) 93.7 kg (206 lb 9.1 oz) 93.8 kg (206 lb 12.7 oz)    Subjective Patient sedated and intubated. Spoke with family.   Marland Kitchen antiseptic oral rinse  7 mL Mouth Rinse QID  . azithromycin  500 mg Intravenous Q24H  . ceFEPime (MAXIPIME) IV  2 g Intravenous Q24H  . chlorhexidine gluconate  15 mL Mouth Rinse BID  . feeding supplement (PRO-STAT SUGAR FREE 64)  60 mL Per Tube QID  . feeding supplement (VITAL HIGH PROTEIN)  1,000 mL Per Tube Q24H  . folic acid  1 mg Per Tube Daily  . hydrocortisone sod succinate (SOLU-CORTEF) inj  50 mg Intravenous Q6H  . levalbuterol  0.63 mg Nebulization Q6H  . midazolam      . pantoprazole (PROTONIX) IV  40 mg Intravenous Daily  . sodium chloride flush  10-40 mL Intracatheter Q12H  . vancomycin  750 mg Intravenous Q12H   . sodium chloride 75 mL/hr at 07/23/15 0352  . amiodarone 30 mg/hr (07/23/15 0521)  . heparin 1,750 Units/hr (07/22/15 2236)  . insulin (NOVOLIN-R) infusion 8 Units/hr (07/23/15 0949)  . phenylephrine (NEO-SYNEPHRINE) Adult infusion 20 mcg/min (07/23/15 0622)  . propofol (DIPRIVAN) infusion 40 mcg/kg/min (07/23/15 0842)    LABS: Basic Metabolic Panel:  Recent Labs  07/22/15 0824 07/22/15 2015 07/23/15 0448  NA 133* 137 137  K 3.4* 3.6 3.3*  CL 102 104 106  CO2 20* 19* 21*  GLUCOSE 295* 140* 178*  BUN 22* 30* 35*  CREATININE 1.65* 1.48* 1.50*  CALCIUM 7.3* 7.4* 7.1*  MG 1.4*  2.3 2.4  PHOS 2.4*  --  4.2   Liver Function Tests: No results for input(s): AST, ALT, ALKPHOS, BILITOT, PROT, ALBUMIN in the last 72 hours. No results for input(s): LIPASE, AMYLASE in the last 72 hours. CBC:  Recent Labs  07/22/15 0824 07/23/15 0448  WBC 13.7* 12.1*  HGB 10.0* 8.4*  HCT 29.1* 25.5*  MCV 94.8 94.1  PLT 216 252   Cardiac Enzymes: No results for input(s): CKTOTAL, CKMB, CKMBINDEX, TROPONINI in the last 72 hours. BNP: No results for input(s): PROBNP in the last 72 hours. D-Dimer: No results for input(s): DDIMER in the last 72 hours. Hemoglobin A1C: No results for input(s): HGBA1C in the last 72 hours. Fasting Lipid Panel:  Recent Labs  07/21/15 1024  TRIG 127   Thyroid Function Tests: No results for input(s): TSH, T4TOTAL, T3FREE, THYROIDAB in the last 72 hours.  Invalid input(s): FREET3   Radiology/Studies:  DG CHEST PORT 1 VIEW [id number] Resulted: 07/22/15 0833    Order Status: Completed Updated: 07/22/15 0836   Narrative:    CLINICAL DATA: Ventilator dependent respiratory failure, coronary artery disease, sepsis, acute encephalopathy, left lower lobe pneumonia.  EXAM: PORTABLE CHEST 1 VIEW  COMPARISON: Portable chest x-ray of July 21, 2015  FINDINGS: The lungs are reasonably well inflated. There are bilateral pleural effusions layering posteriorly. There is left lower lobe atelectasis or pneumonia. The cardiac silhouette remains enlarged. The pulmonary vascularity remains engorged. The pulmonary interstitial markings are slightly more conspicuous overall today.  The endotracheal tube tip lies 4.7 cm above the carina. The esophagogastric tube tip projects below the inferior margin of the image. The PICC line tip projects over the midportion of the SVC. The patient has undergone lower thoracic lumbar an upper lumbar posterior fusion.  IMPRESSION: CHF with mild pulmonary interstitial edema slightly more conspicuous today.  Persistent left lower lobe atelectasis or pneumonia. Persistent bilateral pleural effusions layering posteriorly. The support tubes are in reasonable position.   Electronically Signed By: David Martinique M.D. On: 07/22/2015 08:33     Echo: 07/20/15:Study Conclusions  - Left ventricle: The cavity size was normal. Wall thickness was normal. Systolic function was vigorous. The estimated ejection fraction was in the range of 65% to 70%. Indeterminant diastolic function. Although no diagnostic regional wall motion abnormality was identified, this possibility cannot be completely excluded on the basis of this study. - Aortic valve: Moderately calcified leaflets. There was no stenosis. - Mitral valve: Mildly calcified annulus. There was trivial regurgitation. - Right ventricle: The cavity size was normal. Systolic function was normal. - Tricuspid valve: Peak RV-RA gradient (S): 29 mm Hg. - Pulmonary arteries: PA peak pressure: 32 mm Hg (S). - Inferior vena cava: The vessel was normal in size. The respirophasic diameter changes were in the normal range (>= 50%), consistent with normal central venous pressure.  Impressions:  - Technically difficult study with poor acoustic windows. Normal LV size with EF 65-70%. Normal RV size and systolic function. No significant valvular abnormalities.  PHYSICAL EXAM General: Elderly, obese WM intubated on vent Head: Normal Neck: Negative for carotid bruits. JVD not elevated. No adenopathy Lungs: diffuse  wheezing.  Heart: IRRR S1 S2 without murmur. Abdomen: Soft, non-tender, non-distended with normoactive bowel sounds.  Msk:  Strength and tone appears normal for age. Extremities: Tr edema.  Distal pedal pulses are 2+ and equal bilaterally. Neuro: sedated on vent   ASSESSMENT AND PLAN: 1. Acute sepsis.  2. Acute respiratory failure with hypoxemia secondary to #1. Concerned about atypical PNA with history of  immunosuppression.  No clear source for sepsis. S/p bronch with BAL. Cultures so far negative. ID consult. Viral culures pending.  I think his presentation is not due to CHF.  Echo shows normal LV function. No ischemic changes on Ecg. CXR still shows diffuse infiltrates but I think this is more inflammatory. 3. Afib with RVR. Rate increased last night to 140s. Started on IV amiodarone and rate has improved. On IV heparin for anticoagulation. No active bleeding. Not a candidate for beta blocker or cardizem due to hypotension. No on IV Neo. 4. Elevated troponin. Initial Ecg in sinus rhythm showed no acute change. Repeat in AFib with RVR initially showed new ST depression in the lateral leads. I suspect this is mostly demand ischemia in setting of sepsis, PNA, hypoxemia, anemia, afib, etc. Echo shows normal LV function and Ecg currently shows no ischemic changes. I do not anticipate further ischemic work up at this time with his other medical problems.  5. Acute on CKD stage 3 6. RA on chronic steroids/ methotrexate. 7. DM type 2. 8. S/p lumbar laminectomy  Thursday 07/15/15.  9. CAD s/p BMS LCx in 2010. Myoview 05/12/15 was low  risk with mild apical thinning. 10. Mild Aortic stenosis   Present on Admission:  . Sepsis ([place]) . Acute encephalopathy . LLL pneumonia . Rheumatoid arthritis ([place]) . Coronary atherosclerosis . Essential hypertension, benign . Elevated troponin  Signed, Zykeem Bauserman Martinique, [place] 07/23/2015 9:56 AM

## 2015-07-23 NOTE — Progress Notes (Signed)
[facility] for Infectious Disease    Date of Admission:  07/17/2015   Total days of antibiotics 7        Day 7 vanco        Day 6 cefepime        Day 2azithromycin   ID: Curtis Frazier is a 75 y.o. male with pneumonia in immunocompromised host Principal Problem:   Sepsis ([place]) Active Problems:   Essential hypertension, benign   Coronary atherosclerosis   Rheumatoid arthritis ([place])   Acute encephalopathy   LLL pneumonia   Type II or unspecified type diabetes mellitus without mention of complication, not stated as uncontrolled   S/P lumbar laminectomy   Elevated troponin   Acute respiratory failure with hypoxia ([facility])   Atrial fibrillation with RVR ([facility])   Acute pulmonary edema ([facility])   HCAP (healthcare-associated pneumonia)   Palliative care encounter   DNR (do not resuscitate) discussion   Acute respiratory failure ([place])    Subjective: Remains afebrile  Interval hx: had afib with rvr last night. Did not tolerate vent wean. Remains on 50% FIO2  Medications:  . antiseptic oral rinse  7 mL Mouth Rinse QID  . azithromycin  500 mg Intravenous Q24H  . ceFEPime (MAXIPIME) IV  2 g Intravenous Q24H  . chlorhexidine gluconate  15 mL Mouth Rinse BID  . feeding supplement (PRO-STAT SUGAR FREE 64)  60 mL Per Tube QID  . feeding supplement (VITAL HIGH PROTEIN)  1,000 mL Per Tube Q24H  . folic acid  1 mg Per Tube Daily  . hydrocortisone sod succinate (SOLU-CORTEF) inj  50 mg Intravenous Q6H  . levalbuterol  0.63 mg Nebulization Q6H  . pantoprazole (PROTONIX) IV  40 mg Intravenous Daily  . sodium chloride flush  10-40 mL Intracatheter Q12H  . vancomycin  750 mg Intravenous Q12H    Objective: Vital signs in last 24 hours: Temp:  [97.6 F (36.4 C)-100.2 F (37.9 C)] 98.5 F (36.9 C) (02/10 1518) Pulse Rate:  [58-142] 123 (02/10 1700) Resp:  [14-32] 18 (02/10 1700) BP: (79-134)/(57-113) 115/70 mmHg (02/10 1700) SpO2:  [92 %-100 %] 97 % (02/10 1700) FiO2 (%):  [50 %] 50 %  (02/10 1537) Weight:  [206 lb 12.7 oz (93.8 kg)] 206 lb 12.7 oz (93.8 kg) (02/10 0200) Physical Exam  Constitutional: sedated. He appears well-developed and well-nourished. No distress.  HENT: intubated   Cardiovascular: irreg, irreg. Exam reveals no gallop and no friction rub.  No murmur heard.  Pulmonary/Chest: Effort normal and breath sounds normal. No respiratory distress. He has no wheezes.  Abdominal: Soft. Bowel sounds are normal. He exhibits no distension. There is no tenderness.  Skin: Skin is warm and dry. No rash noted. No erythema.    Lab Results  Recent Labs  07/22/15 0824 07/22/15 2015 07/23/15 0448  WBC 13.7*  --  12.1*  HGB 10.0*  --  8.4*  HCT 29.1*  --  25.5*  NA 133* 137 137  K 3.4* 3.6 3.3*  CL 102 104 106  CO2 20* 19* 21*  BUN 22* 30* 35*  CREATININE 1.65* 1.48* 1.50*   Microbiology: Legionella negative Studies/Results: Dg Chest Port 1 View  07/23/2015  CLINICAL DATA:  75 year old male with ventilator dependent respiratory failure. Sepsis. Initial encounter. EXAM: PORTABLE CHEST 1 VIEW COMPARISON:  07/22/2015 and earlier. FINDINGS: Portable AP semi upright view at 0405 hours. Stable endotracheal tube position. Enteric tube courses to the abdomen, tip not included. Stable right PICC line. Stable  cardiomegaly and mediastinal contours. Interval decreased veiling opacity in the lungs but continued dense retrocardiac opacity at both bases. Partially visible spinal fusion hardware appears stable. No pneumothorax. No overt edema. IMPRESSION: 1.  Stable lines and tubes. 2. Bilateral pleural effusions are less apparent. No overt edema. Continued dense bilateral lower lobe collapse or consolidation. Electronically Signed   By: Genevie Ann M.D.   On: 07/23/2015 06:36   Dg Chest Port 1 View  07/22/2015  CLINICAL DATA:  Ventilator dependent respiratory failure, coronary artery disease, sepsis, acute encephalopathy, left lower lobe pneumonia. EXAM: PORTABLE CHEST 1 VIEW  COMPARISON:  Portable chest x-ray of July 21, 2015 FINDINGS: The lungs are reasonably well inflated. There are bilateral pleural effusions layering posteriorly. There is left lower lobe atelectasis or pneumonia. The cardiac silhouette remains enlarged. The pulmonary vascularity remains engorged. The pulmonary interstitial markings are slightly more conspicuous overall today. The endotracheal tube tip lies 4.7 cm above the carina. The esophagogastric tube tip projects below the inferior margin of the image. The PICC line tip projects over the midportion of the SVC. The patient has undergone lower thoracic lumbar an upper lumbar posterior fusion. IMPRESSION: CHF with mild pulmonary interstitial edema slightly more conspicuous today. Persistent left lower lobe atelectasis or pneumonia. Persistent bilateral pleural effusions layering posteriorly. The support tubes are in reasonable position. Electronically Signed   By: David  Martinique M.D.   On: 07/22/2015 08:33     Assessment/Plan: 75yo M with history of RA previously on steroids, CAD, who presents with fever, respiratory distress now intubated with little response to HCAP coverage. Due to being immunocompromised host from steroid use, potentially at risk for atypical pulmonary infection such as nocardia.  - would continue with azithromycin despite urine antigen being negative for now - continue with vancomycin and cefepime - RVP panel is pending to see if this could be viral pneumonia -  If has recurrent fevers or worsening vent requirement, may consider expanding cefepime to meropenem   [facility], [facility] Cell: [phone] Pager: [phone]  07/23/2015, 5:10 PM

## 2015-07-23 NOTE — Progress Notes (Signed)
PULMONARY / CRITICAL CARE MEDICINE   Name: Curtis Frazier MRN: [id number] DOB: Mar 15, 1941    ADMISSION DATE:  07/17/2015 CONSULTATION DATE: 2/8  REFERRING MD:  Hongalgi   CHIEF COMPLAINT:  Acute respiratory failure and sepsis   HISTORY OF PRESENT ILLNESS:   75 yom who is s/p recent lumbar surg on 2/2. Following d/c on 2/4 developed acute confusion and worsening weakness w/ fever > 102. He was brought to the ER. Admitted w/ working dx of sepsis felt d/t PNA +/- UTI. His hospital course was c/b intermittent fever, AF w/ RVR, hypoxia and confusion. He was treated w/ IVFs and empiric abx. On 2/7 apparently had been doing better. On 2/8 developed increased SOB, for which he was placed on NIPPV, this was followed again by fever and then worsening agitation. He had significant hypoxia w/ P/F ratio of  77. PCCM was asked to assess after his agitation escalated to point he would no longer keep his BIPAP on.   SUBJECTIVE:  Failed wean this morning with tachypnea. Overnight was started on amio drip due to RVR with his A.fib.  VITAL SIGNS: BP 105/71 mmHg  Pulse 109  Temp(Src) 98.4 F (36.9 C) (Oral)  Resp 27  Ht 5\' 8"  (1.727 m)  Wt 206 lb 12.7 oz (93.8 kg)  BMI 31.45 kg/m2  SpO2 98%  HEMODYNAMICS:    VENTILATOR SETTINGS: Vent Mode:  [-] PSV;CPAP FiO2 (%):  [50 %] 50 % Set Rate:  [26 bmp] 26 bmp Vt Set:  [550 mL] 550 mL PEEP:  [5 cmH20] 5 cmH20 Pressure Support:  [5 cmH20] 5 cmH20 Plateau Pressure:  [11 cmH20-17 cmH20] 15 cmH20  INTAKE / OUTPUT: I/O last 3 completed shifts: In: 6265.4 [I.V.:4645.4; NG/GT:345; IV Piggyback:1275] Out: [id number] [Urine:3275; Emesis/NG output:400]  PHYSICAL EXAMINATION:  General:  Chronically ill appearing elderly white male, Sedated Neuro: sedated, unable to follow commands or arouse  HEENT: ETT.  Cardiovascular:  Tachy, irregular irregular rhythm. Lungs:  Decreased breath sounds at bases. Normal work of breathing. Abdomen:  Soft, not tender. + bowel sounds   Musculoskeletal:  Equal st and bulk. No edema Skin:  Warm, dry, intact.  LABS:  BMET  Recent Labs Lab 07/22/15 0824 07/22/15 2015 07/23/15 0448  NA 133* 137 137  K 3.4* 3.6 3.3*  CL 102 104 106  CO2 20* 19* 21*  BUN 22* 30* 35*  CREATININE 1.65* 1.48* 1.50*  GLUCOSE 295* 140* 178*   Electrolytes  Recent Labs Lab 07/22/15 0824 07/22/15 2015 07/23/15 0448  CALCIUM 7.3* 7.4* 7.1*  MG 1.4* 2.3 2.4  PHOS 2.4*  --  4.2    CBC  Recent Labs Lab 07/21/15 0330 07/22/15 0824 07/23/15 0448  WBC 7.3 13.7* 12.1*  HGB 9.1* 10.0* 8.4*  HCT 27.4* 29.1* 25.5*  PLT 176 216 252    Coag's  Recent Labs Lab 07/17/15 2349  APTT 34  INR 1.17    Sepsis Markers  Recent Labs Lab 07/17/15 2349 07/18/15 0247 07/19/15 1622 07/21/15 0330 07/21/15 1045 07/23/15 0448  LATICACIDVEN 1.2 1.0  --   --  1.2  --   PROCALCITON 1.36  --  3.80 1.93  --  7.58    ABG  Recent Labs Lab 07/19/15 0316 07/21/15 0645 07/21/15 1123  PHART 7.350 7.476* 7.359  PCO2ART 32.9* 26.6* 36.4  PO2ART 88.7 77.4* 94.0    Liver Enzymes  Recent Labs Lab 07/17/15 1750  AST 42*  ALT 18  ALKPHOS 43  BILITOT 1.0  ALBUMIN 2.9*  Cardiac Enzymes  Recent Labs Lab 07/17/15 1750 07/18/15 0909 07/18/15 1334  TROPONINI 0.55* 1.01* 2.06*    Glucose  Recent Labs Lab 07/23/15 0148 07/23/15 0247 07/23/15 0349 07/23/15 0446 07/23/15 0547 07/23/15 0642  GLUCAP 164* 163* 171* 167* 154* 181*    Imaging Dg Chest Port 1 View  07/23/2015  CLINICAL DATA:  75 year old male with ventilator dependent respiratory failure. Sepsis. Initial encounter. EXAM: PORTABLE CHEST 1 VIEW COMPARISON:  07/22/2015 and earlier. FINDINGS: Portable AP semi upright view at 0405 hours. Stable endotracheal tube position. Enteric tube courses to the abdomen, tip not included. Stable right PICC line. Stable cardiomegaly and mediastinal contours. Interval decreased veiling opacity in the lungs but continued dense  retrocardiac opacity at both bases. Partially visible spinal fusion hardware appears stable. No pneumothorax. No overt edema. IMPRESSION: 1.  Stable lines and tubes. 2. Bilateral pleural effusions are less apparent. No overt edema. Continued dense bilateral lower lobe collapse or consolidation. Electronically Signed   By: Genevie Ann M.D.   On: 07/23/2015 06:36   Dg Chest Port 1 View  07/22/2015  CLINICAL DATA:  Ventilator dependent respiratory failure, coronary artery disease, sepsis, acute encephalopathy, left lower lobe pneumonia. EXAM: PORTABLE CHEST 1 VIEW COMPARISON:  Portable chest x-ray of July 21, 2015 FINDINGS: The lungs are reasonably well inflated. There are bilateral pleural effusions layering posteriorly. There is left lower lobe atelectasis or pneumonia. The cardiac silhouette remains enlarged. The pulmonary vascularity remains engorged. The pulmonary interstitial markings are slightly more conspicuous overall today. The endotracheal tube tip lies 4.7 cm above the carina. The esophagogastric tube tip projects below the inferior margin of the image. The PICC line tip projects over the midportion of the SVC. The patient has undergone lower thoracic lumbar an upper lumbar posterior fusion. IMPRESSION: CHF with mild pulmonary interstitial edema slightly more conspicuous today. Persistent left lower lobe atelectasis or pneumonia. Persistent bilateral pleural effusions layering posteriorly. The support tubes are in reasonable position. Electronically Signed   By: David  Martinique M.D.   On: 07/22/2015 08:33   STUDIES:  2/4: CT chest images showed no evidence of a pulmonary embolism about basilar atelectasis most likely. ECHO 2/7: EF 65-70%; indeterminate of diastolic fxn, Peak PAP 32 mmHg 2/8:MRI brain & spine>>> brain unremarkable; fluid collection in laminectomy (post-op?) 2/8: EEG >> abnormal; slowing seen consistent with encephalopathy. No epileptic activity   CULTURES: BCX2 2/8>>> Respiratory  culture 2/8>>> PCP 2/8>>> negative CMV >> RVP>> Fungus negative AFB negative  ANTIBIOTICS: vanc 2/4>>> Cefepime 2/5>>> Azithromycin 2/9>>  SIGNIFICANT EVENTS: 2/2: lumbar surgery 2/4: Admitted for PNA 2/8: CCM, intubated, febrile, hypoxemia   LINES/TUBES: Oett 2/8>>> PICC RUE 2/5>>> PIV Foley  DISCUSSION: 89 yom s/p recent lumbar surgery. Admitted 2/4 w/ delirium and working dx of sepsis in setting of PNA. Course c/b AF w/ RVR, intermittent fevers and respiratory failure in spite of ABX. PCCM asked to see on 2/8 for worsening agitation, respiratory failure and distress.   ASSESSMENT / PLAN:  PULMONARY A: Acute Hypoxic Respiratory Failure Pulmonary infiltrates.  D-dx include PNA/ evolving ALI vs Pulmonary edema. He is on immunosuppression at baseline so need to consider opportunistic infection.  Failed NIPPV  P:   Full vent support; wean as able - failed wean this morning Daily CXR BAL (see ID section)  PAD protocol   CARDIOVASCULAR A:  SIRS/Sepsis PAF w/ RVR Trop I elevation  Hypotension P:  Ck CVP w/ goal CVP 8-12; today 15 MAP goal >65 On neo drip for BPs;  wean as able Continue IVFs Cardiology following Continue amio drip for rate control Cont heparin gtt  Additional recs per cards  RENAL A:   CKD stage 3 w/ acute on chronic renal failure - hypotension caused acute worsening - Stable P:   Avoid hypotension - on neo drip Trend chemistry  Strict I&O  GASTROINTESTINAL A:   No acute but at risk for critical illness related malnutrition  P:   PPI for SUP Continue TF  HEMATOLOGIC A:   Anemia w/out evidence of blood loss Chronic Thrombocytopenia  P:  Trend CBC  Heparin per cards section  Transfuse per Protocol; hbg today 8.4  INFECTIOUS A:   Severe sepsis source unclear.  R/o PNA - ddx- aspiration, hap, opportunistic infection Had been treating w/ working dx of PNA and also possible UTI w/ multi-org UC.  Also consider recent lumbar  surgery-->need to consider abscess although surgical site looks clean  P:   Continue Abx (Vanc/cefepime) Added on Azithromycin for opportunistic coverage PCP, AFB, fungus all negative CMV and RVP pending ID consulted and following; rec continuing course for now and wait to see results of labs Lactic acid wnl Urine legionella pending and strep pneumoniae negative Trend procalcitonin - increased today from 1.93 to 7.58  ENDOCRINE A:   DM w/ hyperglycemia on steriods P:   Insulin drip Steriods 50mg  q6hrs - steroid burst (on chronic steroids at home for RA and PMR)  NEUROLOGIC A:   Acute Encephalopathy - delirious on admission Recent L spine surgery - post-op day #8 P:   RASS goal: -2 for now given extensive diagnostics PAD protocol  Prn versed addded EEG abnormal; generalized slowing consistent with encephalopathy. No epileptiform activity. Neurosurgery following; no concern for meningitis at this time.  Possible lumbar puncture but hold off for now  FAMILY  - Updates: Daughter updated at bedside by Dr. Vaughan Browner. - Inter-disciplinary family meet or Palliative Care meeting due by:  2/18  Luiz Blare, DO 07/23/2015, 8:13 AM PGY-2, [place]

## 2015-07-23 NOTE — Progress Notes (Signed)
[facility] ADULT ICU REPLACEMENT PROTOCOL FOR AM LAB REPLACEMENT ONLY  The patient does apply for the [facility] Adult ICU Electrolyte Replacment Protocol based on the criteria listed below:   1. Is GFR >/= 40 ml/min? Yes.    Patient's GFR today is 43 2. Is urine output >/= 0.5 ml/kg/hr for the last 6 hours? Yes.   Patient's UOP is 1.51 ml/kg/hr 3. Is BUN < 60 mg/dL? Yes.    Patient's BUN today is 35 4. Abnormal electrolyte K 3.3 5. Ordered repletion with: per protocol 6. If a panic level lab has been reported, has the CCM MD in charge been notified? Yes.  .   Physician:  Lang Snow, [place] 07/23/2015 6:09 AM

## 2015-07-23 NOTE — Progress Notes (Signed)
Patient ID: Curtis Frazier, male   DOB: 1941-01-15, 75 y.o.   MRN: [id number] Subjective:  The patient is somewhat but easily arousable.  Objective: Vital signs in last 24 hours: Temp:  [97.6 F (36.4 C)-100.2 F (37.9 C)] 98.4 F (36.9 C) (02/10 0728) Pulse Rate:  [58-142] 122 (02/10 0830) Resp:  [15-32] 19 (02/10 0830) BP: (73-128)/(54-113) 109/75 mmHg (02/10 0830) SpO2:  [92 %-100 %] 93 % (02/10 0830) FiO2 (%):  [50 %] 50 % (02/10 0714) Weight:  [93.8 kg (206 lb 12.7 oz)] 93.8 kg (206 lb 12.7 oz) (02/10 0200)  Intake/Output from previous day: 02/09 0701 - 02/10 0700 In: 4532.9 [I.V.:3092.9; NG/GT:315; IV Piggyback:1125] Out: 2500 [Urine:2400; Emesis/NG output:100] Intake/Output this shift: Total I/O In: 67.2 [I.V.:52.2; NG/GT:15] Out: 350 [Urine:350]  Physical exam The patient  Is somnolent but easily arousable.  He follows commands.  HisLumbar dressing is clean and dry without discharge.  Lab Results:  Recent Labs  07/22/15 0824 07/23/15 0448  WBC 13.7* 12.1*  HGB 10.0* 8.4*  HCT 29.1* 25.5*  PLT 216 252   BMET  Recent Labs  07/22/15 2015 07/23/15 0448  NA 137 137  K 3.6 3.3*  CL 104 106  CO2 19* 21*  GLUCOSE 140* 178*  BUN 30* 35*  CREATININE 1.48* 1.50*  CALCIUM 7.4* 7.1*    Studies/Results: Mr Curtis Frazier Contrast  07/21/2015  CLINICAL DATA:  75 year old male with recent back surgery. Fever, confusion, sepsis. Intubated. Initial encounter. EXAM: MRI HEAD WITHOUT AND WITH CONTRAST TECHNIQUE: Multiplanar, multiecho pulse sequences of the brain and surrounding structures were obtained without and with intravenous contrast. CONTRAST:  30mL MULTIHANCE GADOBENATE DIMEGLUMINE 529 MG/ML IV SOLN COMPARISON:  Head CT without contrast 07/17/2015. FINDINGS: No restricted diffusion to suggest acute infarction. No midline shift, mass effect, evidence of mass lesion, ventriculomegaly, extra-axial collection or acute intracranial hemorrhage. Cervicomedullary junction and  pituitary are within normal limits. Negative visualized cervical spine. Major intracranial vascular flow voids are preserved. [name] and white matter signal is within normal limits for age throughout the brain. No cortical encephalomalacia or chronic cerebral blood products. No abnormal enhancement identified. No dural thickening. Intubated. Small volume fluid in the pharynx. Right greater than left mastoid effusions. Right greater than left paranasal sinus fluid and opacification. Other Visible internal auditory structures appear normal. Postoperative changes to both globes. Negative orbit and scalp soft tissues. Normal bone marrow signal. IMPRESSION: 1. Negative for age MRI appearance of the brain. 2. Mastoid and paranasal sinus fluid/inflammation in the setting of intubation. Electronically Signed   By: Genevie Ann M.D.   On: 07/21/2015 16:41   Mr Lumbar Spine W Wo Contrast  07/21/2015  CLINICAL DATA:  Status post lumbar laminectomy 2 days ago. History of rheumatoid arthritis. Fever and sepsis. Question infection. EXAM: MRI LUMBAR SPINE WITHOUT AND WITH CONTRAST TECHNIQUE: Multiplanar and multiecho pulse sequences of the lumbar spine were obtained without and with intravenous contrast. CONTRAST:  20 cc MultiHance IV. COMPARISON:  MRI lumbar spine 06/09/2015. Single intraoperative view of the lumbar spine 07/15/2015. FINDINGS: The patient is status post T10-L5 fusion with pedicle screws and stabilization bars in place. Extensive artifact from hardware. No evidence of discitis or osteomyelitis is identified. Vertebral body height and alignment are maintained. Descending nerve roots appear clumped from L3-4 to mid L5 suggestive of arachnoiditis. Imaged intra-abdominal contents are unremarkable. The T9-10 to T11-12 levels are imaged in the sagittal plane only. The central canal and foramina appear open. The T12-L1 to L2-3  levels are obscured on axial imaging by artifact from hardware. L3-4: Status post fusion. The  central canal and foramina appear open. L4-5: Status post laminectomy and fusion. The central canal and foramina appear open. L5-S1: The patient has a new right laminotomy defect. There is a fluid collection in the surgical bed measuring approximately 3.7 cm AP by 1.1 cm transverse by 2.3 cm craniocaudal. Fluid extends into the posterior subcutaneous tissues. Posterior to the L5 vertebral body, there is a focus most compatible with a disc protrusion measuring 2.4 cm transverse by 1.1 cm AP x 2.1 cm craniocaudal. The central canal appears decompressed and the disc is smaller than on the prior exam. The foramina are open. IMPRESSION: Interval right laminotomy at L5-S1 for discectomy. Findings compatible with a disc protrusion are identified. The protrusion appears smaller than on the prior examination and the central canal and foramina are decompressed. Fluid collection in the laminectomy bed extending into the subcutaneous soft tissues cannot be definitively characterized but is likely a postoperative seroma given the patient's recent surgery. Status post T10-L5 fusion. The central canal and foramina appear open at these levels although multiple levels are obscured by artifact. 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 Electronically Signed   By: Inge Rise M.D.   On: 07/21/2015 16:50   Dg Chest Port 1 View  07/23/2015  CLINICAL DATA:  75 year old male with ventilator dependent respiratory failure. Sepsis. Initial encounter. EXAM: PORTABLE CHEST 1 VIEW COMPARISON:  07/22/2015 and earlier. FINDINGS: Portable AP semi upright view at 0405 hours. Stable endotracheal tube position. Enteric tube courses to the abdomen, tip not included. Stable right PICC line. Stable cardiomegaly and mediastinal contours. Interval decreased veiling opacity in the lungs but continued dense retrocardiac opacity at both bases. Partially visible spinal fusion hardware appears stable. No  pneumothorax. No overt edema. IMPRESSION: 1.  Stable lines and tubes. 2. Bilateral pleural effusions are less apparent. No overt edema. Continued dense bilateral lower lobe collapse or consolidation. Electronically Signed   By: Genevie Ann M.D.   On: 07/23/2015 06:36   Dg Chest Port 1 View  07/22/2015  CLINICAL DATA:  Ventilator dependent respiratory failure, coronary artery disease, sepsis, acute encephalopathy, left lower lobe pneumonia. EXAM: PORTABLE CHEST 1 VIEW COMPARISON:  Portable chest x-ray of July 21, 2015 FINDINGS: The lungs are reasonably well inflated. There are bilateral pleural effusions layering posteriorly. There is left lower lobe atelectasis or pneumonia. The cardiac silhouette remains enlarged. The pulmonary vascularity remains engorged. The pulmonary interstitial markings are slightly more conspicuous overall today. The endotracheal tube tip lies 4.7 cm above the carina. The esophagogastric tube tip projects below the inferior margin of the image. The PICC line tip projects over the midportion of the SVC. The patient has undergone lower thoracic lumbar an upper lumbar posterior fusion. IMPRESSION: CHF with mild pulmonary interstitial edema slightly more conspicuous today. Persistent left lower lobe atelectasis or pneumonia. Persistent bilateral pleural effusions layering posteriorly. The support tubes are in reasonable position. Electronically Signed   By: David  Martinique M.D.   On: 07/22/2015 08:33   Portable Chest Xray  07/21/2015  CLINICAL DATA:  Hypoxia EXAM: PORTABLE CHEST 1 VIEW COMPARISON:  July 21, 2015 FINDINGS: Endotracheal tube tip is 4.4 cm above the carina. Nasogastric tube tip and side port are below the diaphragm. Central catheter tip is in the superior vena cava. No pneumothorax. There is persistent pulmonary vascular congestion. There is increase in opacity in the left base region.  There is postoperative change in the lower thoracic and visualized upper lumbar region.  IMPRESSION: Tube and catheter positions as described without pneumothorax. Increased consolidation left base ; question developing pneumonia or possibly aspiration. Pulmonary vascular congestion with a degree of congestive heart failure remains without change. Electronically Signed   By: Lowella Grip III M.D.   On: 07/21/2015 11:14    Assessment/Plan: Postop day #8: Neurologically he seems to  doing okay.  Respiratory failure/pneumonia: The patient is on antibiotics and isbeing weaned from the ventilator.   Fevers:They seemed to have resolved on antibiotics. I think we have a reasonable explanation, i.e. Pneumonia. If there is remaining questions as to the source of his fevers, a lumbar puncture would be in order.  However my suspicion for meningitis is relatively low.  LOS: 6 days     Mikela Senn D 07/23/2015, 8:44 AM

## 2015-07-23 NOTE — Care Management Note (Signed)
Case Management Note  Patient Details  Name: Curtis Frazier MRN: [id number] Date of Birth: June 23, 1940  Subjective/Objective:   Date: 07/20/15 Spoke with patient's son and daughter on 07/19/15, patient unable to give information, on nrb and not oriented . Introduced self as Tourist information centre manager and explained role in discharge planning and how to be reached.  Verified patient lives in [place],  with spouse and patient is wife's caregiver. All family members work , they will not be able to give full time supervision for patient or his wife.  NCM gave family resources for Adult Day Care ([place]) , [facility] and a list of ALF's in [facility] (specifically for wife).   Expressed potential need for no other DME.  Disposition uncertain at that time, family interested in speaking with Palliative care. Patient has Medicare insurance Patient  is driven by family to MD appointments.  Patient has PCP Dr. Loura Pardon.   Plan: CM will continue to follow for discharge planning and [facility] resources.                  Action/Plan: Palliative consulted.  Expected Discharge Date:                  Expected Discharge Plan:  [place]  In-House Referral:     Discharge planning Services  CM Consult  Post Acute Care Choice:    Choice offered to:     DME Arranged:    DME Agency:     HH Arranged:    [place] Agency:     Status of Service:  In process, will continue to follow  Medicare Important Message Given:    Date Medicare IM Given:    Medicare IM give by:    Date Additional Medicare IM Given:    Additional Medicare Important Message give by:     If discussed at [place] of Stay Meetings, dates discussed:    Additional Comments: CM assessed pt with daughter and son in law at bedside.  Per daughter; between the six children pt will have all the support and supervision needed at home but will be interested in [facility], and they will arrange for care for wife when needed so father can focus on  healing at discharge.  Pt was completely independent before admit.  Pt remains intubated at this time and failed wean this am.  CM will continue to monitor for disposition needs Maryclare Labrador, RN 07/23/2015, 10:04 AM

## 2015-07-24 DIAGNOSIS — Z978 Presence of other specified devices: Secondary | ICD-10-CM | POA: Insufficient documentation

## 2015-07-24 DIAGNOSIS — I509 Heart failure, unspecified: Secondary | ICD-10-CM | POA: Insufficient documentation

## 2015-07-24 DIAGNOSIS — Z789 Other specified health status: Secondary | ICD-10-CM

## 2015-07-24 DIAGNOSIS — I5020 Unspecified systolic (congestive) heart failure: Secondary | ICD-10-CM

## 2015-07-24 DIAGNOSIS — J810 Acute pulmonary edema: Secondary | ICD-10-CM

## 2015-07-24 DIAGNOSIS — J9600 Acute respiratory failure, unspecified whether with hypoxia or hypercapnia: Secondary | ICD-10-CM

## 2015-07-24 LAB — RESPIRATORY VIRUS PANEL
ADENOVIRUS: NEGATIVE
INFLUENZA A: NEGATIVE
INFLUENZA B 1: NEGATIVE
Metapneumovirus: NEGATIVE
PARAINFLUENZA 3 A: NEGATIVE
Parainfluenza 1: NEGATIVE
Parainfluenza 2: NEGATIVE
RESPIRATORY SYNCYTIAL VIRUS A: NEGATIVE
RESPIRATORY SYNCYTIAL VIRUS B: NEGATIVE
Rhinovirus: NEGATIVE

## 2015-07-24 LAB — RENAL FUNCTION PANEL
Albumin: 1.9 g/dL — ABNORMAL LOW (ref 3.5–5.0)
Anion gap: 10 (ref 5–15)
BUN: 50 mg/dL — AB (ref 6–20)
CHLORIDE: 111 mmol/L (ref 101–111)
CO2: 21 mmol/L — AB (ref 22–32)
CREATININE: 1.32 mg/dL — AB (ref 0.61–1.24)
Calcium: 7.2 mg/dL — ABNORMAL LOW (ref 8.9–10.3)
GFR calc Af Amer: 59 mL/min — ABNORMAL LOW (ref >60)
GFR, EST NON AFRICAN AMERICAN: 51 mL/min — AB (ref >60)
GLUCOSE: 173 mg/dL — AB (ref 65–99)
POTASSIUM: 3.8 mmol/L (ref 3.5–5.1)
Phosphorus: 3.5 mg/dL (ref 2.5–4.6)
Sodium: 142 mmol/L (ref 135–145)

## 2015-07-24 LAB — GLUCOSE, CAPILLARY
GLUCOSE-CAPILLARY: 200 mg/dL — AB (ref 65–99)
GLUCOSE-CAPILLARY: 173 mg/dL — AB (ref 65–99)
GLUCOSE-CAPILLARY: 159 mg/dL — AB (ref 65–99)
GLUCOSE-CAPILLARY: 146 mg/dL — AB (ref 65–99)
GLUCOSE-CAPILLARY: 153 mg/dL — AB (ref 65–99)
GLUCOSE-CAPILLARY: 221 mg/dL — AB (ref 65–99)
GLUCOSE-CAPILLARY: 304 mg/dL — AB (ref 65–99)
GLUCOSE-CAPILLARY: 245 mg/dL — AB (ref 65–99)
Glucose-Capillary: 136 mg/dL — ABNORMAL HIGH (ref 65–99)
Glucose-Capillary: 136 mg/dL — ABNORMAL HIGH (ref 65–99)
Glucose-Capillary: 144 mg/dL — ABNORMAL HIGH (ref 65–99)
Glucose-Capillary: 153 mg/dL — ABNORMAL HIGH (ref 65–99)
Glucose-Capillary: 184 mg/dL — ABNORMAL HIGH (ref 65–99)
Glucose-Capillary: 143 mg/dL — ABNORMAL HIGH (ref 65–99)
Glucose-Capillary: 281 mg/dL — ABNORMAL HIGH (ref 65–99)

## 2015-07-24 LAB — HEPARIN LEVEL (UNFRACTIONATED): Heparin Unfractionated: 0.48 IU/mL (ref 0.30–0.70)

## 2015-07-24 LAB — CBC
HCT: 26.6 % — ABNORMAL LOW (ref 39.0–52.0)
HEMOGLOBIN: 8.6 g/dL — AB (ref 13.0–17.0)
MCH: 30.9 pg (ref 26.0–34.0)
MCHC: 32.3 g/dL (ref 30.0–36.0)
MCV: 95.7 fL (ref 78.0–100.0)
Platelets: 296 10*3/uL (ref 150–400)
RBC: 2.78 MIL/uL — AB (ref 4.22–5.81)
RDW: 17.5 % — ABNORMAL HIGH (ref 11.5–15.5)
WBC: 10.8 10*3/uL — AB (ref 4.0–10.5)

## 2015-07-24 LAB — TRIGLYCERIDES: Triglycerides: 158 mg/dL — ABNORMAL HIGH (ref <150)

## 2015-07-24 LAB — MAGNESIUM: MAGNESIUM: 2.3 mg/dL (ref 1.7–2.4)

## 2015-07-24 LAB — PROCALCITONIN: PROCALCITONIN: 3.87 ng/mL

## 2015-07-24 MED ORDER — FUROSEMIDE 10 MG/ML IJ SOLN
40.0000 mg | Freq: Two times a day (BID) | INTRAMUSCULAR | Status: DC
  Administered 2015-07-24 – 2015-07-26 (×4): 40 mg via INTRAVENOUS
  Filled 2015-07-24 (×6): qty 4

## 2015-07-24 MED ORDER — INSULIN NPH (HUMAN) (ISOPHANE) 100 UNIT/ML ~~LOC~~ SUSP
5.0000 [IU] | Freq: Two times a day (BID) | SUBCUTANEOUS | Status: DC
  Administered 2015-07-24 (×2): 5 [IU] via SUBCUTANEOUS
  Filled 2015-07-24: qty 10

## 2015-07-24 MED ORDER — INSULIN NPH (HUMAN) (ISOPHANE) 100 UNIT/ML ~~LOC~~ SUSP
10.0000 [IU] | Freq: Two times a day (BID) | SUBCUTANEOUS | Status: DC
  Administered 2015-07-25 – 2015-07-26 (×3): 10 [IU] via SUBCUTANEOUS
  Filled 2015-07-24: qty 10

## 2015-07-24 MED ORDER — HYDROCORTISONE NA SUCCINATE PF 100 MG IJ SOLR
50.0000 mg | Freq: Two times a day (BID) | INTRAMUSCULAR | Status: DC
  Administered 2015-07-24 – 2015-07-25 (×2): 50 mg via INTRAVENOUS
  Filled 2015-07-24 (×2): qty 1

## 2015-07-24 MED ORDER — INSULIN ASPART 100 UNIT/ML ~~LOC~~ SOLN
0.0000 [IU] | SUBCUTANEOUS | Status: DC
  Administered 2015-07-24: 8 [IU] via SUBCUTANEOUS
  Administered 2015-07-24: 11 [IU] via SUBCUTANEOUS
  Administered 2015-07-24 (×2): 5 [IU] via SUBCUTANEOUS
  Administered 2015-07-24: 3 [IU] via SUBCUTANEOUS
  Administered 2015-07-25 (×2): 5 [IU] via SUBCUTANEOUS
  Administered 2015-07-25: 3 [IU] via SUBCUTANEOUS
  Administered 2015-07-25 (×3): 5 [IU] via SUBCUTANEOUS
  Administered 2015-07-26: 3 [IU] via SUBCUTANEOUS
  Administered 2015-07-26: 5 [IU] via SUBCUTANEOUS
  Administered 2015-07-26: 8 [IU] via SUBCUTANEOUS
  Administered 2015-07-26: 5 [IU] via SUBCUTANEOUS
  Administered 2015-07-26 (×2): 3 [IU] via SUBCUTANEOUS
  Administered 2015-07-26 – 2015-07-27 (×2): 5 [IU] via SUBCUTANEOUS
  Administered 2015-07-27: 8 [IU] via SUBCUTANEOUS

## 2015-07-24 NOTE — Progress Notes (Signed)
ANTICOAGULATION CONSULT NOTE  Pharmacy Consult for heparin Indication: atrial fibrillation  Patient Measurements: Height: 5\' 8"  (172.7 cm) Weight: 209 lb 7 oz (95 kg) IBW/kg (Calculated) : 68.4 Heparin Dosing Weight: 87.8 kg  Vital Signs: Temp: 98.2 F (36.8 C) (02/11 0740) Temp Source: Oral (02/11 0740) BP: 106/72 mmHg (02/11 0900) Pulse Rate: 127 (02/11 0900)  Labs:  Recent Labs  07/22/15 0824  07/22/15 2015 07/22/15 2143 07/23/15 0448 07/23/15 0500 07/24/15 0430  HGB 10.0*  --   --   --  8.4*  --  8.6*  HCT 29.1*  --   --   --  25.5*  --  26.6*  PLT 216  --   --   --  252  --  296  HEPARINUNFRC  --   < >  --  0.49  --  0.44 0.48  CREATININE 1.65*  --  1.48*  --  1.50*  --  1.32*  < > = values in this interval not displayed.  Estimated Creatinine Clearance: 53.2 mL/min (by C-G formula based on Cr of 1.32).   . sodium chloride 75 mL/hr at 07/24/15 0900  . amiodarone 30 mg/hr (07/24/15 0900)  . heparin 1,750 Units/hr (07/24/15 0900)  . insulin (NOVOLIN-R) infusion Stopped (07/24/15 0430)  . phenylephrine (NEO-SYNEPHRINE) Adult infusion 5 mcg/min (07/24/15 0935)  . propofol (DIPRIVAN) infusion Stopped (07/24/15 1010)     Assessment: 75 yo m on heparin for afib. Pt's plavix has been stopped and starting heparin was ok'ed by neurosurg. CHADSVASc = 5.   Heparin level therapeutic on 1750 units/hr.  No issues per RN  Goal of Therapy:  Heparin level 0.3-0.7 units/ml Monitor platelets by anticoagulation protocol: Yes   Plan:  Continue heparin infusion at 1750 units/hr Daily heparin level and CBC. Monitor for bleeding  Levester Fresh, PharmD, BCPS, [facility] Clinical Pharmacist Pager [phone] 07/24/2015 10:59 AM

## 2015-07-24 NOTE — Progress Notes (Signed)
PULMONARY / CRITICAL CARE MEDICINE   Name: Curtis Frazier MRN: [id number] DOB: 15-Aug-1940    ADMISSION DATE:  07/17/2015 CONSULTATION DATE: 2/8  REFERRING MD:  H[place]   CHIEF COMPLAINT:  Acute respiratory failure and sepsis   HISTORY OF PRESENT ILLNESS:   82 yom who is s/p recent lumbar surg on 2/2. Following d/c on 2/4 developed acute confusion and worsening weakness w/ fever > 102. He was brought to the ER. Admitted w/ working dx of sepsis felt d/t PNA +/- UTI. His hospital course was c/b intermittent fever, AF w/ RVR, hypoxia and confusion. He was treated w/ IVFs and empiric abx. On 2/7 apparently had been doing better. On 2/8 developed increased SOB, for which he was placed on NIPPV, this was followed again by fever and then worsening agitation. He had significant hypoxia w/ P/F ratio of  77. PCCM was asked to assess after his agitation escalated to point he would no longer keep his BIPAP on.   SUBJECTIVE:  No fever  VITAL SIGNS: BP 106/72 mmHg  Pulse 127  Temp(Src) 98.2 F (36.8 C) (Oral)  Resp 18  Ht 5\' 8"  (1.727 m)  Wt 95 kg (209 lb 7 oz)  BMI 31.85 kg/m2  SpO2 92%  HEMODYNAMICS:    VENTILATOR SETTINGS: Vent Mode:  [-] PSV;CPAP FiO2 (%):  [40 %-50 %] 40 % Set Rate:  [26 bmp] 26 bmp Vt Set:  [550 mL] 550 mL PEEP:  [5 cmH20] 5 cmH20 Pressure Support:  [5 cmH20] 5 cmH20 Plateau Pressure:  [14 cmH20] 14 cmH20  INTAKE / OUTPUT: I/O last 3 completed shifts: In: 7398.8 [I.V.:5838.8; NG/GT:660; IV Piggyback:900] Out: 4165 [Urine:4165]  PHYSICAL EXAMINATION:  General:  Chronically ill appearing elderly white male, Sedated Neuro: sedated, rass 0, fc yes HEENT: ETT.  Cardiovascular:  s1 s2 RRT no r Lungs:  reduced bilat Abdomen:  Soft, not tender. + bowel sounds  Musculoskeletal:  Equal st and bulk. No edema Skin:  Warm, dry, intact.  LABS:  BMET  Recent Labs Lab 07/22/15 2015 07/23/15 0448 07/24/15 0430  NA 137 137 142  K 3.6 3.3* 3.8  CL 104 106 111  CO2  19* 21* 21*  BUN 30* 35* 50*  CREATININE 1.48* 1.50* 1.32*  GLUCOSE 140* 178* 173*   Electrolytes  Recent Labs Lab 07/22/15 0824 07/22/15 2015 07/23/15 0448 07/24/15 0430  CALCIUM 7.3* 7.4* 7.1* 7.2*  MG 1.4* 2.3 2.4 2.3  PHOS 2.4*  --  4.2 3.5    CBC  Recent Labs Lab 07/22/15 0824 07/23/15 0448 07/24/15 0430  WBC 13.7* 12.1* 10.8*  HGB 10.0* 8.4* 8.6*  HCT 29.1* 25.5* 26.6*  PLT 216 252 296    Coag's  Recent Labs Lab 07/17/15 2349  APTT 34  INR 1.17    Sepsis Markers  Recent Labs Lab 07/17/15 2349 07/18/15 0247 07/19/15 1622 07/21/15 0330 07/21/15 1045 07/23/15 0448  LATICACIDVEN 1.2 1.0  --   --  1.2  --   PROCALCITON 1.36  --  3.80 1.93  --  7.58    ABG  Recent Labs Lab 07/19/15 0316 07/21/15 0645 07/21/15 1123  PHART 7.350 7.476* 7.359  PCO2ART 32.9* 26.6* 36.4  PO2ART 88.7 77.4* 94.0    Liver Enzymes  Recent Labs Lab 07/17/15 1750 07/24/15 0430  AST 42*  --   ALT 18  --   ALKPHOS 43  --   BILITOT 1.0  --   ALBUMIN 2.9* 1.9*    Cardiac Enzymes  Recent  Labs Lab 07/17/15 1750 07/18/15 0909 07/18/15 1334  TROPONINI 0.55* 1.01* 2.06*    Glucose  Recent Labs Lab 07/24/15 0103 07/24/15 0157 07/24/15 0300 07/24/15 0354 07/24/15 0427 07/24/15 0738  GLUCAP 136* 144* 153* 143* 153* 221*    Imaging No results found. STUDIES:  2/4: CT chest images showed no evidence of a pulmonary embolism about basilar atelectasis most likely. ECHO 2/7: EF 65-70%; indeterminate of diastolic fxn, Peak PAP 32 mmHg 2/8:MRI brain & spine>>> brain unremarkable; fluid collection in laminectomy (post-op?) 2/8: EEG >> abnormal; slowing seen consistent with encephalopathy. No epileptic activity   CULTURES: BCX2 2/8>>> Respiratory culture 2/8>>> PCP 2/8>>> negative CMV >> RVP>> Fungus negative AFB negative  ANTIBIOTICS: vanc 2/4>>> Cefepime 2/5>>> Azithromycin 2/9>>  SIGNIFICANT EVENTS: 2/2: lumbar surgery 2/4: Admitted for  PNA 2/8: CCM, intubated, febrile, hypoxemia   LINES/TUBES: Oett 2/8>>> PICC RUE 2/5>>> PIV Foley  DISCUSSION: 72 yom s/p recent lumbar surgery. Admitted 2/4 w/ delirium and working dx of sepsis in setting of PNA. Course c/b AF w/ RVR, intermittent fevers and respiratory failure in spite of ABX. PCCM asked to see on 2/8 for worsening agitation, respiratory failure and distress.   ASSESSMENT / PLAN:  PULMONARY A: Acute Hypoxic Respiratory Failure Pulmonary infiltrates.  D-dx include PNA/ evolving ALI vs Pulmonary edema. He is on immunosuppression at baseline so need to consider opportunistic infection.  Unimpressed PNA P:   Wean cpap 5 ps 5, goal 1 hr, assess rsbi pcxr and prior CT unimpressed PNA Even to neg balance goals, was pos last 24 hr [facility]  CARDIOVASCULAR A:  SIRS/Sepsis PAF w/ RVR Trop I elevation  Hypotension P:  MAP goal >60, off pressors Cardiology following Continue amio drip for rate control Cont heparin gtt  kvo ensure  RENAL A:   CKD stage 3 w/ acute on chronic renal failure - hypotension caused acute worsening - Stable P:   Lasix Chem in am  kvo  GASTROINTESTINAL A:   No acute but at risk for critical illness related malnutrition  P:   PPI for SUP Continue TF  HEMATOLOGIC A:   Anemia w/out evidence of blood loss Chronic Thrombocytopenia  P:  Trend CBC  Heparin per cards section  Transfuse per Protocol; hbg today 8.4  INFECTIOUS A:   Severe sepsis source unclear.  R/o PNA - ddx- aspiration, hap, opportunistic infection - unimpressed as source Had been treating w/ working dx of PNA and also possible UTI w/ multi-org UC.  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 Vaughan Browner. - Inter-disciplinary family meet or Palliative Care meeting due by:  2/18  Ccm time 30 min   [name]. [name], MD, [place] Pgr: [facility]

## 2015-07-24 NOTE — Progress Notes (Signed)
Patient ID: Curtis Frazier, male   DOB: 11-28-1940, 75 y.o.   MRN: [id number]   SUBJECTIVE: Remains intubated/sedated on phenylephrine and amiodarone.  HR in 110s-120s atrial fibrillation.  Scheduled Meds: . antiseptic oral rinse  7 mL Mouth Rinse QID  . azithromycin  500 mg Intravenous Q24H  . ceFEPime (MAXIPIME) IV  2 g Intravenous Q24H  . chlorhexidine gluconate  15 mL Mouth Rinse BID  . feeding supplement (PRO-STAT SUGAR FREE 64)  60 mL Per Tube QID  . feeding supplement (VITAL HIGH PROTEIN)  1,000 mL Per Tube Q24H  . folic acid  1 mg Per Tube Daily  . furosemide  40 mg Intravenous Q12H  . hydrocortisone sod succinate (SOLU-CORTEF) inj  50 mg Intravenous Q12H  . insulin aspart  0-15 Units Subcutaneous 6 times per day  . insulin NPH Human  5 Units Subcutaneous Q12H  . levalbuterol  0.63 mg Nebulization Q6H  . pantoprazole (PROTONIX) IV  40 mg Intravenous Daily  . sodium chloride flush  10-40 mL Intracatheter Q12H  . vancomycin  750 mg Intravenous Q12H   Continuous Infusions: . sodium chloride 75 mL/hr at 07/24/15 0900  . amiodarone 30 mg/hr (07/24/15 0900)  . heparin 1,750 Units/hr (07/24/15 0900)  . insulin (NOVOLIN-R) infusion Stopped (07/24/15 0430)  . phenylephrine (NEO-SYNEPHRINE) Adult infusion Stopped (07/24/15 1030)  . propofol (DIPRIVAN) infusion 50 mcg/kg/min (07/24/15 1237)   PRN Meds:.docusate, fentaNYL (SUBLIMAZE) injection, midazolam, sodium chloride flush    Filed Vitals:   07/24/15 1157 07/24/15 1200 07/24/15 1224 07/24/15 1300  BP: 104/63 93/63  95/64  Pulse: 121 129  116  Temp:   98.8 F (37.1 C)   TempSrc:   Oral   Resp: 33 15  14  Height:      Weight:      SpO2: 92% 92%  92%    Intake/Output Summary (Last 24 hours) at 07/24/15 1331 Last data filed at 07/24/15 1237  Gross per 24 hour  Intake 5567.44 ml  Output   2640 ml  Net 2927.44 ml    LABS: Basic Metabolic Panel:  Recent Labs  07/23/15 0448 07/24/15 0430  NA 137 142  K 3.3* 3.8  CL  106 111  CO2 21* 21*  GLUCOSE 178* 173*  BUN 35* 50*  CREATININE 1.50* 1.32*  CALCIUM 7.1* 7.2*  MG 2.4 2.3  PHOS 4.2 3.5   Liver Function Tests:  Recent Labs  07/24/15 0430  ALBUMIN 1.9*   No results for input(s): LIPASE, AMYLASE in the last 72 hours. CBC:  Recent Labs  07/23/15 0448 07/24/15 0430  WBC 12.1* 10.8*  HGB 8.4* 8.6*  HCT 25.5* 26.6*  MCV 94.1 95.7  PLT 252 296   Cardiac Enzymes: No results for input(s): CKTOTAL, CKMB, CKMBINDEX, TROPONINI in the last 72 hours. BNP: Invalid input(s): POCBNP D-Dimer: No results for input(s): DDIMER in the last 72 hours. Hemoglobin A1C: No results for input(s): HGBA1C in the last 72 hours. Fasting Lipid Panel:  Recent Labs  07/24/15 0430  TRIG 158*   Thyroid Function Tests: No results for input(s): TSH, T4TOTAL, T3FREE, THYROIDAB in the last 72 hours.  Invalid input(s): FREET3 Anemia Panel: No results for input(s): VITAMINB12, FOLATE, FERRITIN, TIBC, IRON, RETICCTPCT in the last 72 hours.  RADIOLOGY: Ct Head Wo Contrast  07/17/2015  CLINICAL DATA:  pt awoke this morning "not acting right" has been experiencing acute confusion. Pt w/ back surgery x2 days ago, FEVER 102oriented to location, disoriented to situation and time EXAM:  CT HEAD WITHOUT CONTRAST TECHNIQUE: Contiguous axial images were obtained from the base of the skull through the vertex without intravenous contrast. COMPARISON:  None. FINDINGS: Severe diffuse age-related atrophy with moderate low attenuation in the deep white matter. No evidence of vascular territory infarct or mass. No hemorrhage or extra-axial fluid. No hydrocephalus. The calvarium is intact. There is near complete opacification of the right maxillary sinus. Several bilateral ethmoid air cells are opacified or show inflammatory change particularly on the right. Several mastoid air cells on the right are opacified. IMPRESSION: Age-related involutional change. Significant inflammatory change  involving the maxillary sinus, with inflammatory change also involving right mastoid air cells and ethmoid air cells. Electronically Signed   By: Skipper Cliche M.D.   On: 07/17/2015 19:55   Ct Angio Chest Pe W/cm &/or Wo Cm  07/17/2015  CLINICAL DATA:  Back surgery 3 days ago with severe chest pain and shortness of breath. EXAM: CT ANGIOGRAPHY CHEST WITH CONTRAST TECHNIQUE: Multidetector CT imaging of the chest was performed using the standard protocol during bolus administration of intravenous contrast. Multiplanar CT image reconstructions and MIPs were obtained to evaluate the vascular anatomy. CONTRAST:  139mL OMNIPAQUE IOHEXOL 350 MG/ML SOLN COMPARISON:  07/05/2011 FINDINGS: THORACIC INLET/BODY WALL: Bilateral thyroid nodules measuring up to 11 mm, likely stable from 2013 when allowing streak artifact on previous study. No acute finding MEDIASTINUM: Mild cardiomegaly. No pericardial effusion. There is mild to moderate aortic valve calcifications/sclerosis without visible progression since 2013. Diffuse atherosclerosis, including the coronary arteries. No evidence of acute aortic syndrome. CTA of the pulmonary arteries is limited by bolus dispersion and respiratory motion at the bases, primarily affecting subsegmental vessels. There is no visible pulmonary embolism. Patulous esophagus. LUNG WINDOWS: Streaky opacities in the lower lungs with volume loss consistent with atelectasis. Intermittent airway mucus/debris, also seen on the previous study. Interlobular septal thickening at the apices compatible with early edema. Probable emphysema, with uncertainty related to respiratory motion. UPPER ABDOMEN: Cholelithiasis. OSSEOUS: T10 to lumbar posterior fixation. There are compression fractures of the T5, T7, and T8 vertebral bodies without acute/unhealed fracture line visible. Progressive and advanced adjacent segment disc degeneration above the fusion. Review of the MIP images confirms the above findings.  IMPRESSION: 1. No evidence of pulmonary embolism. 2. Bibasilar atelectasis. 3. Early pulmonary edema. 4. Study degraded by respiratory motion. Electronically Signed   By: Monte Fantasia M.D.   On: 07/17/2015 21:37   Mr Jeri Cos [id number] Contrast  07/21/2015  CLINICAL DATA:  75 year old male with recent back surgery. Fever, confusion, sepsis. Intubated. Initial encounter. EXAM: MRI HEAD WITHOUT AND WITH CONTRAST TECHNIQUE: Multiplanar, multiecho pulse sequences of the brain and surrounding structures were obtained without and with intravenous contrast. CONTRAST:  62mL MULTIHANCE GADOBENATE DIMEGLUMINE 529 MG/ML IV SOLN COMPARISON:  Head CT without contrast 07/17/2015. FINDINGS: No restricted diffusion to suggest acute infarction. No midline shift, mass effect, evidence of mass lesion, ventriculomegaly, extra-axial collection or acute intracranial hemorrhage. Cervicomedullary junction and pituitary are within normal limits. Negative visualized cervical spine. Major intracranial vascular flow voids are preserved. [place]so and white matter signal is within normal limits for age throughout the brain. No cortical encephalomalacia or chronic cerebral blood products. No abnormal enhancement identified. No dural thickening. Intubated. Small volume fluid in the pharynx. Right greater than left mastoid effusions. Right greater than left paranasal sinus fluid and opacification. Other Visible internal auditory structures appear normal. Postoperative changes to both globes. Negative orbit and scalp soft tissues. Normal bone marrow signal. IMPRESSION: 1.  Negative for age MRI appearance of the brain. 2. Mastoid and paranasal sinus fluid/inflammation in the setting of intubation. Electronically Signed   By: Genevie Ann M.D.   On: 07/21/2015 16:41   Mr Lumbar Spine W Wo Contrast  07/21/2015  CLINICAL DATA:  Status post lumbar laminectomy 2 days ago. History of rheumatoid arthritis. Fever and sepsis. Question infection. EXAM: MRI LUMBAR SPINE  WITHOUT AND WITH CONTRAST TECHNIQUE: Multiplanar and multiecho pulse sequences of the lumbar spine were obtained without and with intravenous contrast. CONTRAST:  20 cc MultiHance IV. COMPARISON:  MRI lumbar spine 06/09/2015. Single intraoperative view of the lumbar spine 07/15/2015. FINDINGS: The patient is status post T10-L5 fusion with pedicle screws and stabilization bars in place. Extensive artifact from hardware. No evidence of discitis or osteomyelitis is identified. Vertebral body height and alignment are maintained. Descending nerve roots appear clumped from L3-4 to mid L5 suggestive of arachnoiditis. Imaged intra-abdominal contents are unremarkable. The T9-10 to T11-12 levels are imaged in the sagittal plane only. The central canal and foramina appear open. The T12-L1 to L2-3 levels are obscured on axial imaging by artifact from hardware. L3-4: Status post fusion. The central canal and foramina appear open. L4-5: Status post laminectomy and fusion. The central canal and foramina appear open. L5-S1: The patient has a new right laminotomy defect. There is a fluid collection in the surgical bed measuring approximately 3.7 cm AP by 1.1 cm transverse by 2.3 cm craniocaudal. Fluid extends into the posterior subcutaneous tissues. Posterior to the L5 vertebral body, there is a focus most compatible with a disc protrusion measuring 2.4 cm transverse by 1.1 cm AP x 2.1 cm craniocaudal. The central canal appears decompressed and the disc is smaller than on the prior exam. The foramina are open. IMPRESSION: Interval right laminotomy at L5-S1 for discectomy. Findings compatible with a disc protrusion are identified. The protrusion appears smaller than on the prior examination and the central canal and foramina are decompressed. Fluid collection in the laminectomy bed extending into the subcutaneous soft tissues cannot be definitively characterized but is likely a postoperative seroma given the patient's recent surgery.  Status post T10-L5 fusion. The central canal and foramina appear open at these levels although multiple levels are obscured by artifact. 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 Electronically Signed   By: Inge Rise M.D.   On: 07/21/2015 16:50   Dg Chest Port 1 View  07/23/2015  CLINICAL DATA:  75 year old male with ventilator dependent respiratory failure. Sepsis. Initial encounter. EXAM: PORTABLE CHEST 1 VIEW COMPARISON:  07/22/2015 and earlier. FINDINGS: Portable AP semi upright view at 0405 hours. Stable endotracheal tube position. Enteric tube courses to the abdomen, tip not included. Stable right PICC line. Stable cardiomegaly and mediastinal contours. Interval decreased veiling opacity in the lungs but continued dense retrocardiac opacity at both bases. Partially visible spinal fusion hardware appears stable. No pneumothorax. No overt edema. IMPRESSION: 1.  Stable lines and tubes. 2. Bilateral pleural effusions are less apparent. No overt edema. Continued dense bilateral lower lobe collapse or consolidation. Electronically Signed   By: Genevie Ann M.D.   On: 07/23/2015 06:36   Dg Chest Port 1 View  07/22/2015  CLINICAL DATA:  Ventilator dependent respiratory failure, coronary artery disease, sepsis, acute encephalopathy, left lower lobe pneumonia. EXAM: PORTABLE CHEST 1 VIEW COMPARISON:  Portable chest x-ray of July 21, 2015 FINDINGS: The lungs are reasonably well inflated. There are bilateral pleural effusions layering posteriorly. There is left  lower lobe atelectasis or pneumonia. The cardiac silhouette remains enlarged. The pulmonary vascularity remains engorged. The pulmonary interstitial markings are slightly more conspicuous overall today. The endotracheal tube tip lies 4.7 cm above the carina. The esophagogastric tube tip projects below the inferior margin of the image. The PICC line tip projects over the midportion of the SVC. The patient has  undergone lower thoracic lumbar an upper lumbar posterior fusion. IMPRESSION: CHF with mild pulmonary interstitial edema slightly more conspicuous today. Persistent left lower lobe atelectasis or pneumonia. Persistent bilateral pleural effusions layering posteriorly. The support tubes are in reasonable position. Electronically Signed   By: David  Martinique M.D.   On: 07/22/2015 08:33   Portable Chest Xray  07/21/2015  CLINICAL DATA:  Hypoxia EXAM: PORTABLE CHEST 1 VIEW COMPARISON:  July 21, 2015 FINDINGS: Endotracheal tube tip is 4.4 cm above the carina. Nasogastric tube tip and side port are below the diaphragm. Central catheter tip is in the superior vena cava. No pneumothorax. There is persistent pulmonary vascular congestion. There is increase in opacity in the left base region. There is postoperative change in the lower thoracic and visualized upper lumbar region. IMPRESSION: Tube and catheter positions as described without pneumothorax. Increased consolidation left base ; question developing pneumonia or possibly aspiration. Pulmonary vascular congestion with a degree of congestive heart failure remains without change. Electronically Signed   By: Lowella Grip III M.D.   On: 07/21/2015 11:14   Dg Chest Port 1 View  07/21/2015  CLINICAL DATA:  Decreased oxygen saturation EXAM: PORTABLE CHEST 1 VIEW COMPARISON:  July 19, 2015 FINDINGS: Central catheter tip is in the superior vena cava. No pneumothorax. Interstitial edema remains, stable. There is no airspace consolidation. There is cardiomegaly with mild pulmonary venous hypertension. No new opacity. No adenopathy. There is postoperative change in the lower thoracic and visualized upper lumbar region. IMPRESSION: Evidence of a degree of congestive heart failure, stable. No new opacity. No change in cardiac silhouette. Central catheter tip in superior vena cava. No pneumothorax evident. Electronically Signed   By: Lowella Grip III M.D.   On:  07/21/2015 07:52   Dg Chest Port 1 View  07/19/2015  CLINICAL DATA:  Respiratory distress EXAM: PORTABLE CHEST 1 VIEW COMPARISON:  07/18/2015 FINDINGS: Cardiomegaly with vascular congestion and diffuse interstitial prominence compatible with edema. Right PICC line is in place with the tip at the cavoatrial junction. Possible layering left effusion. No acute bony abnormality. IMPRESSION: Findings compatible with CHF.  Possible small left effusion. Electronically Signed   By: Rolm Baptise M.D.   On: 07/19/2015 13:40   Dg Chest Port 1 View  07/18/2015  CLINICAL DATA:  Line placement EXAM: PORTABLE CHEST 1 VIEW COMPARISON:  07/18/2015 FINDINGS: Right-sided PICC line has been placed, tip overlying the level of the superior vena cava. The heart is enlarged. There is pulmonary vascular congestion. Opacity at the medial left lung base is increased, consistent with developing infiltrate and/or atelectasis. IMPRESSION: 1. Right-sided PICC line tip to the superior vena cava. 2. Developing left lower lobe infiltrate and/or atelectasis. Electronically Signed   By: Nolon Nations M.D.   On: 07/18/2015 14:04   Dg Chest Port 1 View  07/18/2015  CLINICAL DATA:  Shortness of breath. EXAM: PORTABLE CHEST 1 VIEW COMPARISON:  July 17, 2015. FINDINGS: Stable cardiomegaly. No pneumothorax or pleural effusion is noted. Stable mild bibasilar subsegmental atelectasis is noted. Bony thorax is unremarkable. IMPRESSION: Stable mild bibasilar subsegmental atelectasis. Electronically Signed   By: Sabino Dick  Brooke Bonito, M.D.   On: 07/18/2015 09:06   Dg Chest Port 1 View  07/17/2015  CLINICAL DATA:  Pt w/ back surgery x2 days ago, [facility] report temp of 102.0 on arrival and pt given 1gm APAP, pt also w/ SPO2 of 86% on room air, pt placed on simple mask at 6L/min and SPO2 up to 96%. Pt now under sepsis protocol and is experiencing acute confusion EXAM: PORTABLE CHEST 1 VIEW COMPARISON:  01/15/2015 FINDINGS: Heart is mildly enlarged. There are  no focal consolidations or pleural effusions. There is mild left lower lobe subsegmental atelectasis or scarring. Less likely this could represent early infectious infiltrate. No evidence for pulmonary edema. Previous thoracic spine fusion. IMPRESSION: 1. Cardiomegaly without pulmonary edema. 2. Left lower lobe atelectasis versus early infiltrate. Followup is recommended. Electronically Signed   By: Nolon Nations M.D.   On: 07/17/2015 18:06    PHYSICAL EXAM General: Intubated/sedated Neck: JVP difficult, no thyromegaly or thyroid nodule.  Lungs: Coarse BS bilaterally CV: Nondisplaced PMI.  Heart tachy, irregular S1/S2, no S3/S4, no murmur.  No peripheral edema.   Abdomen: Soft, no hepatosplenomegaly, no distention.  Neurologic: Sedated.  Extremities: No clubbing or cyanosis.   TELEMETRY: Reviewed telemetry pt in atrial fibrillation 110s-120s  ASSESSMENT AND PLAN: 75 yo who is s/p recent lumbar surgery on 2/2. Following d/c on 2/4 developed acute confusion and worsening weakness w/ fever > 102. He was brought to the ER. Admitted w/ working dx of sepsis felt due to PNA.  Course complicated by shock and atrial fibrillation with RVR.  1. Acute hypoxemic respiratory failure: HCAP in immunocompromised host (RA meds).  On broad spectrum antibiotics per ID, CCM.  Suspect this is primarily due to PNA rather than CHF/volume overload.  2. Atrial fibrillation with RVR: Echo with normal EF, no significant valvular abnormalities.  HR in 110s for the most part with amiodarone gtt.  Can add digoxin if gets higher.  He is on heparin gtt for anticoagulation.  3. Hypotension: Suspect septic shock.  He is on phenylephrine to maintain BP and avoid exacerbating tachycardia.  4. CAD: Troponin elevation this admission, suspect demand ischemia with tachycardia/hypotension.  Has history of CAD with prior stent. Echo with EF 65-70%.   Loralie Champagne 07/24/2015 1:36 PM

## 2015-07-24 NOTE — Progress Notes (Signed)
Patient remains sedated on ventilator. Efforts at weaning went poorly secondary to confusion and agitation. Moving all 4 extremities well. His wound is clean and dry without any evidence of infection or drainage.  Patient remains on broad-spectrum antibiotics for sepsis from pneumonia. No new recommendations at this point. Given his broad antibiotic coverage I do not think that lumbar puncture would be of any value.

## 2015-07-24 NOTE — Progress Notes (Signed)
Daily Progress Note   Patient Name: Curtis Frazier       Date: 07/24/2015 DOB: February 19, 1941  Age: 75 y.o. MRN#: [id number] Attending Physician: Marshell Garfinkel, MD Primary Care Physician: Loura Pardon, MD Admit Date: 07/17/2015  Reason for Consultation/Follow-up: Establishing goals of care and Psychosocial/spiritual support  Subjective:  - meeting with family per their request.   Continued emotional support for family, five children present for today's  meeting.  Lots of questions and concerns, addressed to my best ability.  Discussed with Dr [name] their concern regarding a noted sinus infection they were told about on admission head CT, and their question regarding need for further investigation into  "back infection".  Dr [name] addressed their concerns to their satisfaction.  All remain hopeful for improvement and return to baseline  Length of Stay: 7 days  Current Medications: Scheduled Meds:  . antiseptic oral rinse  7 mL Mouth Rinse QID  . azithromycin  500 mg Intravenous Q24H  . ceFEPime (MAXIPIME) IV  2 g Intravenous Q24H  . chlorhexidine gluconate  15 mL Mouth Rinse BID  . feeding supplement (PRO-STAT SUGAR FREE 64)  60 mL Per Tube QID  . feeding supplement (VITAL HIGH PROTEIN)  1,000 mL Per Tube Q24H  . folic acid  1 mg Per Tube Daily  . furosemide  40 mg Intravenous Q12H  . hydrocortisone sod succinate (SOLU-CORTEF) inj  50 mg Intravenous Q12H  . insulin aspart  0-15 Units Subcutaneous 6 times per day  . insulin NPH Human  5 Units Subcutaneous Q12H  . levalbuterol  0.63 mg Nebulization Q6H  . pantoprazole (PROTONIX) IV  40 mg Intravenous Daily  . sodium chloride flush  10-40 mL Intracatheter Q12H  . vancomycin  750 mg Intravenous Q12H    Continuous Infusions: .  sodium chloride 75 mL/hr at 07/24/15 0900  . amiodarone 30 mg/hr (07/24/15 0900)  . heparin 1,750 Units/hr (07/24/15 0900)  . insulin (NOVOLIN-R) infusion Stopped (07/24/15 0430)  . phenylephrine (NEO-SYNEPHRINE) Adult infusion Stopped (07/24/15 1030)  . propofol (DIPRIVAN) infusion 50 mcg/kg/min (07/24/15 1237)    PRN Meds: docusate, fentaNYL (SUBLIMAZE) injection, midazolam, sodium chloride flush  Physical Exam: Physical Exam  Constitutional: He appears well-developed. He appears ill. He is intubated.  HENT:  Head: Normocephalic.  Cardiovascular: Tachycardia present.   Pulmonary/Chest: He  is intubated.  Abdominal: Soft. Normal appearance.  Skin: Skin is warm and dry.                Vital Signs: BP 95/64 mmHg  Pulse 116  Temp(Src) 98.8 F (37.1 C) (Oral)  Resp 14  Ht 5\' 8"  (1.727 m)  Wt 95 kg (209 lb 7 oz)  BMI 31.85 kg/m2  SpO2 92% SpO2: SpO2: 92 % O2 Device: O2 Device: Ventilator O2 Flow Rate: O2 Flow Rate (L/min): 15 L/min  Intake/output summary:  Intake/Output Summary (Last 24 hours) at 07/24/15 1340 Last data filed at 07/24/15 1237  Gross per 24 hour  Intake 5567.44 ml  Output   2640 ml  Net 2927.44 ml   LBM: Last BM Date: 07/23/15 Baseline Weight: Weight: 83.915 kg (185 lb) Most recent weight: Weight: 95 kg (209 lb 7 oz)       Palliative Assessment/Data: Flowsheet Rows        Most Recent Value   Intake Tab    Referral Department  Hospitalist   Unit at Time of Referral  Intermediate Care Unit   Palliative Care Primary Diagnosis  Cardiac   Date Notified  07/19/15   Palliative Care Type  New Palliative care   Reason for referral  Clarify Goals of Care   Date of Admission  07/17/15   # of days IP prior to Palliative referral  2   Clinical Assessment    Psychosocial & Spiritual Assessment    Palliative Care Outcomes       Additional Data Reviewed: CBC    Component Value Date/Time   WBC 10.8* 07/24/2015 0430   RBC 2.78* 07/24/2015 0430   RBC  3.52* 12/19/2013 0819   HGB 8.6* 07/24/2015 0430   HCT 26.6* 07/24/2015 0430   PLT 296 07/24/2015 0430   MCV 95.7 07/24/2015 0430   MCH 30.9 07/24/2015 0430   MCHC 32.3 07/24/2015 0430   RDW 17.5* 07/24/2015 0430   LYMPHSABS 1.3 07/17/2015 1750   MONOABS 1.6* 07/17/2015 1750   EOSABS 0.0 07/17/2015 1750   BASOSABS 0.2* 07/17/2015 1750    CMP     Component Value Date/Time   NA 142 07/24/2015 0430   K 3.8 07/24/2015 0430   CL 111 07/24/2015 0430   CO2 21* 07/24/2015 0430   GLUCOSE 173* 07/24/2015 0430   BUN 50* 07/24/2015 0430   CREATININE 1.32* 07/24/2015 0430   CALCIUM 7.2* 07/24/2015 0430   PROT 5.8* 07/17/2015 1750   ALBUMIN 1.9* 07/24/2015 0430   AST 42* 07/17/2015 1750   ALT 18 07/17/2015 1750   ALKPHOS 43 07/17/2015 1750   BILITOT 1.0 07/17/2015 1750   GFRNONAA 51* 07/24/2015 0430   GFRAA 59* 07/24/2015 0430       Problem List:  Patient Active Problem List   Diagnosis Date Noted  . Palliative care encounter 07/21/2015  . DNR (do not resuscitate) discussion 07/21/2015  . HCAP (healthcare-associated pneumonia)   . Acute respiratory failure ([place])   . Acute respiratory failure with hypoxia ([place]) 07/18/2015  . Atrial fibrillation with RVR ([place]) 07/18/2015  . Acute pulmonary edema ([place]) 07/18/2015  . Sepsis ([place]) 07/17/2015  . Acute encephalopathy 07/17/2015  . LLL pneumonia 07/17/2015  . Type II or unspecified type diabetes mellitus without mention of complication, not stated as uncontrolled 07/17/2015  . S/P lumbar laminectomy 07/17/2015  . Elevated troponin 07/17/2015  . Type 2 diabetes mellitus without complication, without long-term current use of insulin ([place])   . Acute bronchitis 03/05/2015  .  Back pain, thoracic 01/15/2015  . Chest wall pain 01/15/2015  . Routine general medical examination at a health care facility 11/23/2014  . Productive cough 10/14/2014  . Left flank pain 09/25/2014  . Renal insufficiency 11/14/2013  . Encounter for Medicare  annual wellness exam 07/07/2013  . BPH (benign prostatic hyperplasia) 07/05/2012  . Prostate cancer screening 06/27/2012  . Low TSH level 01/22/2012  . PMR (polymyalgia rheumatica) (H[facility]) 01/20/2012  . Aortic stenosis 07/08/2011  . New onset a-fib ([place]) 07/05/2011    Class: Acute  . HYPERKALEMIA 02/18/2010  . Essential hypertension, benign 09/13/2009  . ANEMIA-UNSPECIFIED 06/10/2009  . ANGIODYSPLASIA-INTESTINE 06/10/2009  . MURMUR 05/21/2009  . Left carotid bruit 05/21/2009  . Coronary atherosclerosis 03/09/2009  . GERD 03/09/2009  . Rheumatoid arthritis ([place]) 04/29/2008  . HX, PERSONAL, COLONIC POLYPS 01/17/2007  . Diabetes mellitus type 2, controlled, without complications ([place]) [id number]  . Elevated lipids 09/18/2006  . PSORIATIC ARTHRITIS 09/18/2006  . PSORIASIS 09/18/2006  . 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 DEGENERATIVE DISC DISEASE 09/18/2006  . SCOLIOSIS 09/18/2006  . TOBACCO ABUSE, HX OF 09/18/2006     Palliative Care Assessment & Plan    Code Status:  Full code- began discussion regarding the possibility of decisions regarding re-intubation in the future, dependant on outcomes  Family was able to discuss openly what they beleive their father values regarding quality of life, "what if" situations were discussed      Code Status Orders        Start     Ordered   07/17/15 2257  Full code   Continuous     07/17/15 2257    Code Status History    Date Active Date Inactive Code Status Order ID Comments User Context   This patient has a current code status but no historical code status.        6. Discharge Planning:  Pending outcomes   Care plan was discussed with Dr [name]  Thank you for allowing the Palliative Medicine Team to assist in the care of this patient.   Time In: 1200 Time Out: 1300 Total Time 60 min Prolonged Time Billed  no         Knox Royalty, NP  07/24/2015, 1:40 PM  Please contact Palliative Medicine Team phone at [phone]  for questions and concerns.

## 2015-07-24 NOTE — Progress Notes (Signed)
[facility] Progress Note Patient Name: ARSALAN CHENAULT DOB: 05/21/1941 MRN: [id number]   Date of Service  07/24/2015  HPI/Events of Note  Pt currently on insulin drip @ 2-3u/hr. BG controlled. Failing SBT daily. H/O DM Type 2. Currently on stress dose steroids.  eICU Interventions  1. NPH 5u [place] q12hr 2. Moderate Dose SSI w/ Accu-Checks q4hr 3. D/C Insulin drip 30 minutes after NPH administered.     Intervention Category Intermediate Interventions: Hyperglycemia - evaluation and treatment  Tera Partridge 07/24/2015, 3:14 AM

## 2015-07-24 NOTE — Progress Notes (Signed)
[facility] for Infectious Disease    Date of Admission:  07/17/2015   Total days of antibiotics 8        Day 8 vanco        Day 7 cefepime        Day 3 azithromycin   ID: Curtis Frazier is a 75 y.o. male with pneumonia in immunocompromised host Principal Problem:   Sepsis ([place]) Active Problems:   Essential hypertension, benign   Coronary atherosclerosis   Rheumatoid arthritis ([place])   Acute encephalopathy   LLL pneumonia   Type II or unspecified type diabetes mellitus without mention of complication, not stated as uncontrolled   S/P lumbar laminectomy   Elevated troponin   Acute respiratory failure with hypoxia (H[facility])   Atrial fibrillation with RVR (H[facility])   Acute pulmonary edema (H[facility])   HCAP (healthcare-associated pneumonia)   Palliative care encounter   DNR (do not resuscitate) discussion   Acute respiratory failure ([place])    Subjective: Remains afebrile  Interval hx: remains intubated, will try wean for 1 hr today  Medications:  . antiseptic oral rinse  7 mL Mouth Rinse QID  . azithromycin  500 mg Intravenous Q24H  . ceFEPime (MAXIPIME) IV  2 g Intravenous Q24H  . chlorhexidine gluconate  15 mL Mouth Rinse BID  . feeding supplement (PRO-STAT SUGAR FREE 64)  60 mL Per Tube QID  . feeding supplement (VITAL HIGH PROTEIN)  1,000 mL Per Tube Q24H  . folic acid  1 mg Per Tube Daily  . furosemide  40 mg Intravenous Q12H  . hydrocortisone sod succinate (SOLU-CORTEF) inj  50 mg Intravenous Q12H  . insulin aspart  0-15 Units Subcutaneous 6 times per day  . insulin NPH Human  5 Units Subcutaneous Q12H  . levalbuterol  0.63 mg Nebulization Q6H  . pantoprazole (PROTONIX) IV  40 mg Intravenous Daily  . sodium chloride flush  10-40 mL Intracatheter Q12H  . vancomycin  750 mg Intravenous Q12H    Objective: Vital signs in last 24 hours: Temp:  [97.9 F (36.6 C)-98.5 F (36.9 C)] 98.2 F (36.8 C) (02/11 0740) Pulse Rate:  [68-141] 127 (02/11 0900) Resp:  [10-31] 18 (02/11  0900) BP: (87-143)/(59-95) 106/72 mmHg (02/11 0900) SpO2:  [90 %-97 %] 92 % (02/11 0900) FiO2 (%):  [40 %-50 %] 40 % (02/11 0729) Weight:  [209 lb 7 oz (95 kg)] 209 lb 7 oz (95 kg) (02/11 0211) Physical Exam  Constitutional: sedated. He appears well-developed and well-nourished. No distress.  HENT: intubated   Cardiovascular: irreg, irreg. Exam reveals no gallop and no friction rub.  No murmur heard.  Pulmonary/Chest: Effort normal and breath sounds normal. No respiratory distress. He has no wheezes.  Abdominal: Soft. Bowel sounds are normal. He exhibits no distension. There is no tenderness.  Skin: Skin is warm and dry. No rash noted. No erythema.    Lab Results  Recent Labs  07/23/15 0448 07/24/15 0430  WBC 12.1* 10.8*  HGB 8.4* 8.6*  HCT 25.5* 26.6*  NA 137 142  K 3.3* 3.8  CL 106 111  CO2 21* 21*  BUN 35* 50*  CREATININE 1.50* 1.32*   Microbiology: Legionella negative Studies/Results: Dg Chest Port 1 View  07/23/2015  CLINICAL DATA:  75 year old male with ventilator dependent respiratory failure. Sepsis. Initial encounter. EXAM: PORTABLE CHEST 1 VIEW COMPARISON:  07/22/2015 and earlier. FINDINGS: Portable AP semi upright view at 0405 hours. Stable endotracheal tube position. Enteric tube courses to the  abdomen, tip not included. Stable right PICC line. Stable cardiomegaly and mediastinal contours. Interval decreased veiling opacity in the lungs but continued dense retrocardiac opacity at both bases. Partially visible spinal fusion hardware appears stable. No pneumothorax. No overt edema. IMPRESSION: 1.  Stable lines and tubes. 2. Bilateral pleural effusions are less apparent. No overt edema. Continued dense bilateral lower lobe collapse or consolidation. Electronically Signed   By: Genevie Ann M.D.   On: 07/23/2015 06:36     Assessment/Plan: 75yo M with history of RA previously on humara and mtx with low dose pred, CAD, who presents with fever, respiratory distress now  intubated with little response to HCAP coverage. Due to being immunocompromised host from steroid use, potentially at risk for atypical pulmonary infection such as nocardia. UA does not suggest pyuria to be c/w uti, and mri of spine only shows post op changes with incision looking clean at admission  - would continue with azithromycin to finish 5 day course - continue with vancomycin and cefepime - RVP panel is negative -  If has recurrent fevers or worsening vent requirement, may consider expanding cefepime to meropenem - continue with vent support and weaning trial - thus far cultures have not grown any atypical organisms to change treatment   [place], [facility] for Infectious Diseases Cell: [phone] Pager: [phone]  07/24/2015, 11:47 AM

## 2015-07-25 ENCOUNTER — Inpatient Hospital Stay (HOSPITAL_COMMUNITY): Payer: Medicare Other

## 2015-07-25 LAB — BASIC METABOLIC PANEL
Anion gap: 9 (ref 5–15)
BUN: 63 mg/dL — AB (ref 6–20)
CO2: 25 mmol/L (ref 22–32)
Calcium: 7.1 mg/dL — ABNORMAL LOW (ref 8.9–10.3)
Chloride: 111 mmol/L (ref 101–111)
Creatinine, Ser: 1.45 mg/dL — ABNORMAL HIGH (ref 0.61–1.24)
GFR calc Af Amer: 52 mL/min — ABNORMAL LOW (ref >60)
GFR, EST NON AFRICAN AMERICAN: 45 mL/min — AB (ref >60)
Glucose, Bld: 244 mg/dL — ABNORMAL HIGH (ref 65–99)
Potassium: 4.4 mmol/L (ref 3.5–5.1)
SODIUM: 145 mmol/L (ref 135–145)

## 2015-07-25 LAB — CBC
HCT: 24.6 % — ABNORMAL LOW (ref 39.0–52.0)
HEMOGLOBIN: 7.8 g/dL — AB (ref 13.0–17.0)
MCH: 30.5 pg (ref 26.0–34.0)
MCHC: 31.7 g/dL (ref 30.0–36.0)
MCV: 96.1 fL (ref 78.0–100.0)
Platelets: 319 10*3/uL (ref 150–400)
RBC: 2.56 MIL/uL — ABNORMAL LOW (ref 4.22–5.81)
RDW: 17.9 % — ABNORMAL HIGH (ref 11.5–15.5)
WBC: 8.1 10*3/uL (ref 4.0–10.5)

## 2015-07-25 LAB — COMPREHENSIVE METABOLIC PANEL
ALBUMIN: 1.7 g/dL — AB (ref 3.5–5.0)
ALT: 26 U/L (ref 17–63)
ANION GAP: 11 (ref 5–15)
AST: 27 U/L (ref 15–41)
Alkaline Phosphatase: 67 U/L (ref 38–126)
BUN: 55 mg/dL — ABNORMAL HIGH (ref 6–20)
CALCIUM: 6.4 mg/dL — AB (ref 8.9–10.3)
CHLORIDE: 116 mmol/L — AB (ref 101–111)
CO2: 20 mmol/L — AB (ref 22–32)
CREATININE: 1.3 mg/dL — AB (ref 0.61–1.24)
GFR calc non Af Amer: 52 mL/min — ABNORMAL LOW (ref >60)
GFR, EST AFRICAN AMERICAN: 60 mL/min — AB (ref >60)
Glucose, Bld: 205 mg/dL — ABNORMAL HIGH (ref 65–99)
Potassium: 2.9 mmol/L — ABNORMAL LOW (ref 3.5–5.1)
SODIUM: 147 mmol/L — AB (ref 135–145)
Total Bilirubin: 0.2 mg/dL — ABNORMAL LOW (ref 0.3–1.2)
Total Protein: 4.4 g/dL — ABNORMAL LOW (ref 6.5–8.1)

## 2015-07-25 LAB — POCT I-STAT 3, ART BLOOD GAS (G3+)
ACID-BASE DEFICIT: 5 mmol/L — AB (ref 0.0–2.0)
BICARBONATE: 21.9 meq/L (ref 20.0–24.0)
O2 SAT: 91 %
PH ART: 7.274 — AB (ref 7.350–7.450)
PO2 ART: 69 mmHg — AB (ref 80.0–100.0)
Patient temperature: 98.9
TCO2: 23 mmol/L (ref 0–100)
pCO2 arterial: 47.2 mmHg — ABNORMAL HIGH (ref 35.0–45.0)

## 2015-07-25 LAB — TRIGLYCERIDES: TRIGLYCERIDES: 121 mg/dL (ref <150)

## 2015-07-25 LAB — GLUCOSE, CAPILLARY
GLUCOSE-CAPILLARY: 160 mg/dL — AB (ref 65–99)
GLUCOSE-CAPILLARY: 219 mg/dL — AB (ref 65–99)
Glucose-Capillary: 209 mg/dL — ABNORMAL HIGH (ref 65–99)
Glucose-Capillary: 208 mg/dL — ABNORMAL HIGH (ref 65–99)

## 2015-07-25 LAB — PROCALCITONIN: PROCALCITONIN: 3.02 ng/mL

## 2015-07-25 LAB — HEPARIN LEVEL (UNFRACTIONATED): HEPARIN UNFRACTIONATED: 0.44 [IU]/mL (ref 0.30–0.70)

## 2015-07-25 MED ORDER — FREE WATER
300.0000 mL | Freq: Four times a day (QID) | Status: DC
  Administered 2015-07-25 – 2015-07-26 (×5): 300 mL

## 2015-07-25 MED ORDER — POTASSIUM CHLORIDE 20 MEQ/15ML (10%) PO SOLN
40.0000 meq | ORAL | Status: AC
  Administered 2015-07-25 (×2): 40 meq
  Filled 2015-07-25 (×2): qty 30

## 2015-07-25 MED ORDER — POTASSIUM CHLORIDE 10 MEQ/100ML IV SOLN
10.0000 meq | INTRAVENOUS | Status: AC
  Administered 2015-07-25 (×2): 10 meq via INTRAVENOUS
  Filled 2015-07-25 (×2): qty 100

## 2015-07-25 MED ORDER — ETOMIDATE 2 MG/ML IV SOLN
INTRAVENOUS | Status: AC
  Filled 2015-07-25: qty 10

## 2015-07-25 MED ORDER — POLYETHYLENE GLYCOL 3350 17 G PO PACK
17.0000 g | PACK | Freq: Every day | ORAL | Status: DC
  Administered 2015-07-25 – 2015-07-27 (×3): 17 g via ORAL
  Filled 2015-07-25 (×5): qty 1

## 2015-07-25 MED ORDER — ETOMIDATE 2 MG/ML IV SOLN
20.0000 mg | Freq: Once | INTRAVENOUS | Status: AC
Start: 2015-07-25 — End: 2015-07-25
  Administered 2015-07-25: 20 mg via INTRAVENOUS

## 2015-07-25 MED ORDER — SODIUM CHLORIDE 0.45 % IV SOLN
INTRAVENOUS | Status: DC
  Administered 2015-07-25 – 2015-07-26 (×2): via INTRAVENOUS

## 2015-07-25 MED ORDER — FENTANYL CITRATE (PF) 100 MCG/2ML IJ SOLN
50.0000 ug | Freq: Once | INTRAMUSCULAR | Status: AC
  Administered 2015-07-25: 50 ug via INTRAVENOUS
  Filled 2015-07-25: qty 2

## 2015-07-25 MED ORDER — SODIUM CHLORIDE 0.9 % IV SOLN
25.0000 ug/h | INTRAVENOUS | Status: DC
  Administered 2015-07-25: 50 ug/h via INTRAVENOUS
  Administered 2015-07-26: 150 ug/h via INTRAVENOUS
  Administered 2015-07-27: 125 ug/h via INTRAVENOUS
  Administered 2015-07-27: 100 ug/h via INTRAVENOUS
  Filled 2015-07-25 (×5): qty 50

## 2015-07-25 MED ORDER — HYDROCORTISONE NA SUCCINATE PF 100 MG IJ SOLR
25.0000 mg | Freq: Two times a day (BID) | INTRAMUSCULAR | Status: DC
  Administered 2015-07-25 – 2015-07-26 (×2): 25 mg via INTRAVENOUS
  Filled 2015-07-25 (×2): qty 0.5

## 2015-07-25 MED ORDER — FENTANYL BOLUS VIA INFUSION
25.0000 ug | INTRAVENOUS | Status: DC | PRN
  Filled 2015-07-25 (×2): qty 25

## 2015-07-25 NOTE — Progress Notes (Signed)
RN called RT to patient room due to patient being dyssynchronous with the ventilator and a drop in sats.  Upon arrival to patient room, noticed that patient's peak pressures had increased to the 60s, on the ventilator and patient sounded rhonchus.  Suctioned patient however only able to obtain a small amount of thick yellow secretions.  With assistance of RN, took patient off of ventilator and bag lavaged patient and obtained thick yellow mucus plugs.  Sats currently at 92% and patient seems to be slightly more synchronous with ventilator.  Will continue to monitor patient.

## 2015-07-25 NOTE — Progress Notes (Signed)
Overall stable. Patient has been afebrile for 48 hours. Ventilatory weaning still difficult secondary to agitation and confusion. Moving all 4 extremities. Wound continues to heal well without evidence of drainage or infection.  No new recommendations at this point. Continue antibiotics and stress steroids.

## 2015-07-25 NOTE — Progress Notes (Signed)
[facility] ADULT ICU REPLACEMENT PROTOCOL FOR AM LAB REPLACEMENT ONLY  The patient does apply for the [facility] Adult ICU Electrolyte Replacment Protocol based on the criteria listed below:   1. Is GFR >/= 40 ml/min? Yes.    Patient's GFR today is 52 2. Is urine output >/= 0.5 ml/kg/hr for the last 6 hours? Yes.   Patient's UOP is 3.12 ml/kg/hr 3. Is BUN < 60 mg/dL? Yes.    Patient's BUN today is 55 4. Abnormal electrolyte  K 2.9 5. Ordered repletion with: per protocol 6. If a panic level lab has been reported, has the CCM MD in charge been notified? Yes.  .   Physician:  Davonna Belling 07/25/2015 6:58 AM

## 2015-07-25 NOTE — Progress Notes (Signed)
Notified E-Link doctor about pts increased heart rate. Heart rate increase from 70's to 120's. No new orders at this time will continue to monitor closely.

## 2015-07-25 NOTE — Progress Notes (Signed)
SUBJECTIVE: The patient remains sedated on the vent.  Some difficulty with vent today.  AFib converted to sinus yesterday.  Marland Kitchen antiseptic oral rinse  7 mL Mouth Rinse QID  . azithromycin  500 mg Intravenous Q24H  . ceFEPime (MAXIPIME) IV  2 g Intravenous Q24H  . chlorhexidine gluconate  15 mL Mouth Rinse BID  . feeding supplement (PRO-STAT SUGAR FREE 64)  60 mL Per Tube QID  . feeding supplement (VITAL HIGH PROTEIN)  1,000 mL Per Tube Q24H  . folic acid  1 mg Per Tube Daily  . free water  300 mL Per Tube Q6H  . furosemide  40 mg Intravenous Q12H  . hydrocortisone sod succinate (SOLU-CORTEF) inj  25 mg Intravenous Q12H  . insulin aspart  0-15 Units Subcutaneous 6 times per day  . insulin NPH Human  10 Units Subcutaneous Q12H  . levalbuterol  0.63 mg Nebulization Q6H  . pantoprazole (PROTONIX) IV  40 mg Intravenous Daily  . polyethylene glycol  17 g Oral Daily  . potassium chloride  10 mEq Intravenous Q1 Hr x 2  . sodium chloride flush  10-40 mL Intracatheter Q12H  . vancomycin  750 mg Intravenous Q12H   . sodium chloride 75 mL/hr at 07/24/15 1800  . amiodarone 30 mg/hr (07/25/15 0700)  . fentaNYL infusion INTRAVENOUS 200 mcg/hr (07/25/15 1100)  . heparin 1,750 Units/hr (07/25/15 0903)  . insulin (NOVOLIN-R) infusion Stopped (07/24/15 0430)  . phenylephrine (NEO-SYNEPHRINE) Adult infusion Stopped (07/24/15 1030)  . propofol (DIPRIVAN) infusion 50 mcg/kg/min (07/25/15 0645)    OBJECTIVE: Physical Exam: Filed Vitals:   07/25/15 0835 07/25/15 0900 07/25/15 1000 07/25/15 1100  BP: 96/57 127/62 135/59 99/63  Pulse: 83 86 84 92  Temp:      TempSrc:      Resp: 33 26 24 15   Height:      Weight:      SpO2: 95% 94% 94% 93%    Intake/Output Summary (Last 24 hours) at 07/25/15 1130 Last data filed at 07/25/15 1100  Gross per 24 hour  Intake 4319.1 ml  Output   4650 ml  Net -330.9 ml    Telemetry reveals sinus rhythm  GEN- The patient is ill appearing, sedated on  vent Head- normocephalic, atraumatic Eyes-  Sclera clear, conjunctiva pink Oropharynx- ETT in place Neck- supple  Lungs- Clear to ausculation anteriorly Heart- Regular rate and rhythm, decreased HS GI- soft, NT, ND, + BS Extremities- no clubbing, cyanosis, + dependant edema Skin- no rash or lesion Psych- sedated Neuro- sedated  LABS: Basic Metabolic Panel:  Recent Labs  07/23/15 0448 07/24/15 0430 07/25/15 0522  NA 137 142 147*  K 3.3* 3.8 2.9*  CL 106 111 116*  CO2 21* 21* 20*  GLUCOSE 178* 173* 205*  BUN 35* 50* 55*  CREATININE 1.50* 1.32* 1.30*  CALCIUM 7.1* 7.2* 6.4*  MG 2.4 2.3  --   PHOS 4.2 3.5  --    Liver Function Tests:  Recent Labs  07/24/15 0430 07/25/15 0522  AST  --  27  ALT  --  26  ALKPHOS  --  67  BILITOT  --  0.2*  PROT  --  4.4*  ALBUMIN 1.9* 1.7*   No results for input(s): LIPASE, AMYLASE in the last 72 hours. CBC:  Recent Labs  07/24/15 0430 07/25/15 0522  WBC 10.8* 8.1  HGB 8.6* 7.8*  HCT 26.6* 24.6*  MCV 95.7 96.1  PLT 296 319    ASSESSMENT AND PLAN:  75 yo who is s/p recent lumbar surgery on 2/2. Following d/c on 2/4 developed acute confusion and worsening weakness w/ fever > 102. He was brought to the ER. Admitted w/ working dx of sepsis felt due to PNA. Course complicated by shock and atrial fibrillation with RVR.    1. Acute hypoxemic respiratory failure: HCAP in immunocompromised host (RA meds). On broad spectrum antibiotics per ID, CCM. Suspect this is primarily due to PNA rather than CHF/volume overload. would keep Is and Os negative as BP allows  2. Atrial fibrillation with RVR: Echo with normal EF, no significant valvular abnormalities.  Now in sinus rhythm Continue amiodarone Transition to PO when off vent Continue IV heparin drip  3. Hypotension: Suspect septic shock. per primary team   4. CAD: known h/o CAD Elevated troponin likely due to demand ischemia Would follow conservatively   Thompson Grayer,  MD 07/25/2015 11:30 AM

## 2015-07-25 NOTE — Progress Notes (Signed)
Notified cardiology about pts HR in the 120s and a-fib. No new orders at this time, will continue to monitor.

## 2015-07-25 NOTE — Progress Notes (Signed)
PULMONARY / CRITICAL CARE MEDICINE   Name: Curtis Frazier MRN: [id number] DOB: 1941-02-03    ADMISSION DATE:  07/17/2015 CONSULTATION DATE: 2/8  REFERRING MD:  Hongalgi   CHIEF COMPLAINT:  Acute respiratory failure and sepsis   HISTORY OF PRESENT ILLNESS:   75 yom who is s/p recent lumbar surg on 2/2. Following d/c on 2/4 developed acute confusion and worsening weakness w/ fever > 102. He was brought to the ER. Admitted w/ working dx of sepsis felt d/t PNA +/- UTI. His hospital course was c/b intermittent fever, AF w/ RVR, hypoxia and confusion. He was treated w/ IVFs and empiric abx. On 2/7 apparently had been doing better. On 2/8 developed increased SOB, for which he was placed on NIPPV, this was followed again by fever and then worsening agitation. He had significant hypoxia w/ P/F ratio of  77. PCCM was asked to assess after his agitation escalated to point he would no longer keep his BIPAP on.   SUBJECTIVE:  No fever Weaned 1 hr day prior Neg 536 cc  VITAL SIGNS: BP 127/62 mmHg  Pulse 86  Temp(Src) 98.9 F (37.2 C) (Oral)  Resp 26  Ht _0  (1.727 m)  Wt 95.9 kg (211 lb 6.7 oz)  BMI 32.15 kg/m2  SpO2 94%  HEMODYNAMICS:    VENTILATOR SETTINGS: Vent Mode:  [-] PRVC FiO2 (%):  [40 %] 40 % Set Rate:  [26 bmp] 26 bmp Vt Set:  [550 mL] 550 mL PEEP:  [5 cmH20] 5 cmH20 Plateau Pressure:  [15 cmH20-19 cmH20] 19 cmH20  INTAKE / OUTPUT: I/O last 3 completed shifts: In: 7077.7 [I.V.:5887.7; NG/GT:690; IV Piggyback:500] Out: 5815 [Urine:5815]  PHYSICAL EXAMINATION:  General:  Chronically ill appearing elderly white male, Sedated Neuro: sedated, rass 0, not FC but on propofol HEENT: ETT, jvd Cardiovascular:  s1 s2 IRT no r Lungs:  reduced bilat with some ronchi, anterior clear Abdomen:  Soft, not tender. + bowel sounds  Musculoskeletal:  Equal st and bulk. No edema Skin:  Warm, dry, intact.  LABS:  BMET  Recent Labs Lab 07/23/15 0448 07/24/15 0430 07/25/15 0522  NA  137 142 147*  K 3.3* 3.8 2.9*  CL 106 111 116*  CO2 21* 21* 20*  BUN 35* 50* 55*  CREATININE 1.50* 1.32* 1.30*  GLUCOSE 178* 173* 205*   Electrolytes  Recent Labs Lab 07/22/15 0824 07/22/15 2015 07/23/15 0448 07/24/15 0430 07/25/15 0522  CALCIUM 7.3* 7.4* 7.1* 7.2* 6.4*  MG 1.4* 2.3 2.4 2.3  --   PHOS 2.4*  --  4.2 3.5  --     CBC  Recent Labs Lab 07/23/15 0448 07/24/15 0430 07/25/15 0522  WBC 12.1* 10.8* 8.1  HGB 8.4* 8.6* 7.8*  HCT 25.5* 26.6* 24.6*  PLT 252 296 319    Coag's No results for input(s): APTT, INR in the last 168 hours.  Sepsis Markers  Recent Labs Lab 07/21/15 1045 07/23/15 0448 07/24/15 1305 07/25/15 0522  LATICACIDVEN 1.2  --   --   --   PROCALCITON  --  7.58 3.87 3.02    ABG  Recent Labs Lab 07/19/15 0316 07/21/15 0645 07/21/15 1123  PHART 7.350 7.476* 7.359  PCO2ART 32.9* 26.6* 36.4  PO2ART 88.7 77.4* 94.0    Liver Enzymes  Recent Labs Lab 07/24/15 0430 07/25/15 0522  AST  --  27  ALT  --  26  ALKPHOS  --  67  BILITOT  --  0.2*  ALBUMIN 1.9* 1.7*  Cardiac Enzymes  Recent Labs Lab 07/18/15 1334  TROPONINI 2.06*    Glucose  Recent Labs Lab 07/24/15 0427 07/24/15 0738 07/24/15 1222 07/24/15 1531 07/24/15 2011 07/25/15 0740  GLUCAP 153* 221* 281* 304* 245* 160*    Imaging Dg Chest Port 1 View  07/25/2015  CLINICAL DATA:  75 year old male with sepsis. EXAM: PORTABLE CHEST 1 VIEW COMPARISON:  07/23/2015 and prior studies FINDINGS: The patient is rotated. An endotracheal tube with tip 4 cm above carina, right PICC line with tip overlying the superior cavoatrial junction, NG tube entering the stomach with tip off the field of view again noted. Persistent right lower lung airspace disease/ atelectasis, right perihilar opacity/atelectasis and left lower lung consolidations/atelectasis noted. Pulmonary vascular congestion has decreased. There may be trace pleural effusions. There is no evidence of  pneumothorax. IMPRESSION: Decreased pulmonary vascular congestion without other significant change. Continued atelectasis/ airspace disease/ consolidation as described. Electronically Signed   By: Margarette Canada M.D.   On: 07/25/2015 07:26   STUDIES:  2/4: CT chest images showed no evidence of a pulmonary embolism about basilar atelectasis most likely. ECHO 2/7: EF 65-70%; indeterminate of diastolic fxn, Peak PAP 32 mmHg 2/8:MRI brain & spine>>> brain unremarkable; fluid collection in laminectomy (post-op?) 2/8: EEG >> abnormal; slowing seen consistent with encephalopathy. No epileptic activity   CULTURES: BCX2 2/8>>> Respiratory culture 2/8>>> PCP 2/8>>> negative CMV >> RVP>> Fungus negative AFB negative  ANTIBIOTICS: vanc 2/4>>> Cefepime 2/5>>> Azithromycin 2/9>>5 days  SIGNIFICANT EVENTS: 2/2: lumbar surgery 2/4: Admitted for PNA 2/8: CCM, intubated, febrile, hypoxemia   LINES/TUBES: Oett 2/8>>> PICC RUE 2/5>>> PIV Foley  DISCUSSION: 75 yom s/p recent lumbar surgery. Admitted 2/4 w/ delirium and working dx of sepsis in setting of PNA. Course c/b AF w/ RVR, intermittent fevers and respiratory failure in spite of ABX. PCCM asked to see on 2/8 for worsening agitation, respiratory failure and distress.   ASSESSMENT / PLAN:  PULMONARY A: Acute Hypoxic Respiratory Failure Pulmonary infiltrates.  D-dx include PNA/ evolving ALI vs Pulmonary edema. He is on immunosuppression at baseline so need to consider opportunistic infection.  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  R/o PNA - ddx- aspiration, hap, opportunistic infection - unimpressed as source Had been treating w/ working dx of PNA and also possible UTI w/ multi-org UC.  Also consider recent lumbar surgery fluid collection No fevers x 48 hr P:   Per ID  ENDOCRINE A:   DM w/ hyperglycemia on steriods P:   Insulin drip off, ssi, NPH, last 160 Steriods steroids to 25 mg q12h  NEUROLOGIC A:   Acute Encephalopathy - improved Recent L spine surgery - post-op day #8 P:   RASS goal: 0 PAD protocol [facility] Neurosurgery following Propofol at 50, may need to change to precedex Add fent for dychrony, may need low dose drip  FAMILY  - Updates: DF 2/11 to 3 kids - Inter-disciplinary family meet or Palliative Care meeting due by:  2/18  Ccm time 30 min   [name]. [name], MD, [place] Pgr: [place] [facility]

## 2015-07-25 NOTE — Procedures (Signed)
Bronchoscopy Procedure Note ARSHDEEP FRUCHEY [id number] 1941/02/20  Procedure: Bronchoscopy Indications: Diagnostic evaluation of the airways, Obtain specimens for culture and/or other diagnostic studies and Remove secretions  Procedure Details Consent: Unable to obtain consent because of emergent medical necessity. Time Out: Verified patient identification, verified procedure, site/side was marked, verified correct patient position, special equipment/implants available, medications/allergies/relevent history reviewed, required imaging and test results available.  Performed  In preparation for procedure, patient was given 100% FiO2 and bronchoscope lubricated. Sedation: Etomidate  Airway entered and the following bronchi were examined: RUL, RML, RLL, LUL, LLL and Bronchi.   Procedures performed: Brushings performed- no Bronchoscope removed.  , Patient placed back on 100% FiO2 at conclusion of procedure.    Evaluation Hemodynamic Status: BP stable throughout; O2 sats: stable throughout Patient's Current Condition: stable Specimens:  Sent purulent fluid Complications: No apparent complications Patient did tolerate procedure well.   [name]. 07/25/2015   Desaturation, recent plug removed, attempted to call all numbers  1. Partial obstruction ETT 50% thick hard pus 2. LLL fully collapsed pus thick hard extensive, lavaged clean, BAL  Explains dyschrony earlier  [name]. [name], MD, [place], [place] Pgr: [facility]

## 2015-07-25 NOTE — Progress Notes (Signed)
ANTICOAGULATION CONSULT NOTE  Pharmacy Consult for heparin Indication: atrial fibrillation  Patient Measurements: Height: 5\' 8"  (172.7 cm) Weight: 211 lb 6.7 oz (95.9 kg) IBW/kg (Calculated) : 68.4 Heparin Dosing Weight: 87.8 kg  Vital Signs: Temp: 98.9 F (37.2 C) (02/12 0742) Temp Source: Oral (02/12 0742) BP: 127/62 mmHg (02/12 0900) Pulse Rate: 86 (02/12 0900)  Labs:  Recent Labs  07/23/15 0448 07/23/15 0500 07/24/15 0430 07/25/15 0521 07/25/15 0522  HGB 8.4*  --  8.6*  --  7.8*  HCT 25.5*  --  26.6*  --  24.6*  PLT 252  --  296  --  319  HEPARINUNFRC  --  0.44 0.48 0.44  --   CREATININE 1.50*  --  1.32*  --  1.30*    Estimated Creatinine Clearance: 54.3 mL/min (by C-G formula based on Cr of 1.3).   . sodium chloride 75 mL/hr at 07/24/15 1800  . amiodarone 30 mg/hr (07/25/15 0700)  . heparin 1,750 Units/hr (07/25/15 0903)  . insulin (NOVOLIN-R) infusion Stopped (07/24/15 0430)  . phenylephrine (NEO-SYNEPHRINE) Adult infusion Stopped (07/24/15 1030)  . propofol (DIPRIVAN) infusion 50 mcg/kg/min (07/25/15 0645)     Assessment: 75 yo m on heparin for afib. Pt's plavix has been stopped and starting heparin was ok'ed by neurosurg. CHADSVASc = 5.   Heparin level therapeutic on 1750 units/hr.  No issues per RN  Goal of Therapy:  Heparin level 0.3-0.7 units/ml Monitor platelets by anticoagulation protocol: Yes   Plan:  Continue heparin infusion at 1750 units/hr Daily heparin level and CBC. Monitor for bleeding  Levester Fresh, PharmD, BCPS, [facility] Clinical Pharmacist Pager [phone] 07/25/2015 9:50 AM

## 2015-07-26 ENCOUNTER — Inpatient Hospital Stay (HOSPITAL_COMMUNITY): Payer: Medicare Other

## 2015-07-26 LAB — GLUCOSE, CAPILLARY
GLUCOSE-CAPILLARY: 186 mg/dL — AB (ref 65–99)
GLUCOSE-CAPILLARY: 191 mg/dL — AB (ref 65–99)
GLUCOSE-CAPILLARY: 210 mg/dL — AB (ref 65–99)
GLUCOSE-CAPILLARY: 217 mg/dL — AB (ref 65–99)
GLUCOSE-CAPILLARY: 224 mg/dL — AB (ref 65–99)
GLUCOSE-CAPILLARY: 256 mg/dL — AB (ref 65–99)
Glucose-Capillary: 217 mg/dL — ABNORMAL HIGH (ref 65–99)
Glucose-Capillary: 206 mg/dL — ABNORMAL HIGH (ref 65–99)

## 2015-07-26 LAB — COMPREHENSIVE METABOLIC PANEL
ALK PHOS: 85 U/L (ref 38–126)
ALT: 29 U/L (ref 17–63)
ANION GAP: 12 (ref 5–15)
AST: 24 U/L (ref 15–41)
Albumin: 1.9 g/dL — ABNORMAL LOW (ref 3.5–5.0)
BUN: 72 mg/dL — ABNORMAL HIGH (ref 6–20)
CALCIUM: 7.1 mg/dL — AB (ref 8.9–10.3)
CO2: 23 mmol/L (ref 22–32)
CREATININE: 1.51 mg/dL — AB (ref 0.61–1.24)
Chloride: 107 mmol/L (ref 101–111)
GFR, EST AFRICAN AMERICAN: 50 mL/min — AB (ref >60)
GFR, EST NON AFRICAN AMERICAN: 43 mL/min — AB (ref >60)
Glucose, Bld: 247 mg/dL — ABNORMAL HIGH (ref 65–99)
Potassium: 4.4 mmol/L (ref 3.5–5.1)
SODIUM: 142 mmol/L (ref 135–145)
TOTAL PROTEIN: 5.7 g/dL — AB (ref 6.5–8.1)
Total Bilirubin: 0.3 mg/dL (ref 0.3–1.2)

## 2015-07-26 LAB — CBC WITH DIFFERENTIAL/PLATELET
BASOS ABS: 0 10*3/uL (ref 0.0–0.1)
BASOS PCT: 0 %
EOS ABS: 0.3 10*3/uL (ref 0.0–0.7)
Eosinophils Relative: 4 %
HCT: 28.7 % — ABNORMAL LOW (ref 39.0–52.0)
Hemoglobin: 8.7 g/dL — ABNORMAL LOW (ref 13.0–17.0)
LYMPHS PCT: 15 %
Lymphs Abs: 1.2 10*3/uL (ref 0.7–4.0)
MCH: 30.1 pg (ref 26.0–34.0)
MCHC: 30.3 g/dL (ref 30.0–36.0)
MCV: 99.3 fL (ref 78.0–100.0)
MONO ABS: 1.1 10*3/uL — AB (ref 0.1–1.0)
Monocytes Relative: 14 %
NEUTROS PCT: 67 %
Neutro Abs: 5.1 10*3/uL (ref 1.7–7.7)
PLATELETS: 346 10*3/uL (ref 150–400)
RBC: 2.89 MIL/uL — AB (ref 4.22–5.81)
RDW: 18.2 % — AB (ref 11.5–15.5)
SMEAR REVIEW: DECREASED
WBC: 7.7 10*3/uL (ref 4.0–10.5)

## 2015-07-26 LAB — PROTIME-INR
INR: 7.99 (ref 0.00–1.49)
INR: 1.17 (ref 0.00–1.49)
INR: 2.22 — AB (ref 0.00–1.49)
PROTHROMBIN TIME: 15.1 s (ref 11.6–15.2)
Prothrombin Time: 63.9 seconds — ABNORMAL HIGH (ref 11.6–15.2)
Prothrombin Time: 24.4 seconds — ABNORMAL HIGH (ref 11.6–15.2)

## 2015-07-26 LAB — APTT: aPTT: 25 seconds (ref 24–37)

## 2015-07-26 LAB — TROPONIN I: Troponin I: 0.16 ng/mL — ABNORMAL HIGH (ref <0.031)

## 2015-07-26 LAB — HEPARIN LEVEL (UNFRACTIONATED): Heparin Unfractionated: 0.78 IU/mL — ABNORMAL HIGH (ref 0.30–0.70)

## 2015-07-26 LAB — TRIGLYCERIDES: TRIGLYCERIDES: 124 mg/dL (ref <150)

## 2015-07-26 LAB — VANCOMYCIN, TROUGH: VANCOMYCIN TR: 33 ug/mL — AB (ref 10.0–20.0)

## 2015-07-26 MED ORDER — INSULIN NPH (HUMAN) (ISOPHANE) 100 UNIT/ML ~~LOC~~ SUSP: 15.0000 [IU] | Freq: Two times a day (BID) | SUBCUTANEOUS | Status: DC

## 2015-07-26 MED ORDER — SODIUM CHLORIDE 0.9 % IV SOLN
1.0000 g | Freq: Two times a day (BID) | INTRAVENOUS | Status: DC
Start: 2015-07-26 — End: 2015-07-29
  Administered 2015-07-26 – 2015-07-28 (×6): 1 g via INTRAVENOUS
  Filled 2015-07-26 (×8): qty 1

## 2015-07-26 MED ORDER — INSULIN NPH (HUMAN) (ISOPHANE) 100 UNIT/ML ~~LOC~~ SUSP
15.0000 [IU] | Freq: Two times a day (BID) | SUBCUTANEOUS | Status: DC
  Filled 2015-07-26: qty 10

## 2015-07-26 MED ORDER — VANCOMYCIN HCL IN DEXTROSE 750-5 MG/150ML-% IV SOLN
750.0000 mg | INTRAVENOUS | Status: AC
  Administered 2015-07-27 – 2015-07-30 (×4): 750 mg via INTRAVENOUS
  Filled 2015-07-26 (×4): qty 150

## 2015-07-26 MED ORDER — INSULIN NPH (HUMAN) (ISOPHANE) 100 UNIT/ML ~~LOC~~ SUSP
15.0000 [IU] | Freq: Two times a day (BID) | SUBCUTANEOUS | Status: DC
  Administered 2015-07-26 – 2015-07-30 (×8): 15 [IU] via SUBCUTANEOUS
  Filled 2015-07-26 (×3): qty 10

## 2015-07-26 MED ORDER — VITAMIN K1 10 MG/ML IJ SOLN
10.0000 mg | Freq: Once | INTRAMUSCULAR | Status: DC
  Filled 2015-07-26: qty 1

## 2015-07-26 MED ORDER — HYDROCORTISONE NA SUCCINATE PF 100 MG IJ SOLR
50.0000 mg | Freq: Four times a day (QID) | INTRAMUSCULAR | Status: DC
  Administered 2015-07-26 – 2015-07-30 (×16): 50 mg via INTRAVENOUS
  Filled 2015-07-26 (×5): qty 1
  Filled 2015-07-26: qty 2
  Filled 2015-07-26 (×2): qty 1
  Filled 2015-07-26: qty 2
  Filled 2015-07-26 (×5): qty 1

## 2015-07-26 MED ORDER — PANTOPRAZOLE SODIUM 40 MG PO PACK
40.0000 mg | PACK | Freq: Every day | ORAL | Status: DC
  Administered 2015-07-27 – 2015-08-03 (×8): 40 mg
  Filled 2015-07-26 (×9): qty 20

## 2015-07-26 MED ORDER — SODIUM CHLORIDE 0.45 % IV SOLN
INTRAVENOUS | Status: DC
  Administered 2015-07-26: 18:00:00 via INTRAVENOUS
  Administered 2015-07-28: 10 mL/h via INTRAVENOUS

## 2015-07-26 MED ORDER — AMIODARONE IV BOLUS ONLY 150 MG/100ML
150.0000 mg | Freq: Once | INTRAVENOUS | Status: AC
  Administered 2015-07-26: 150 mg via INTRAVENOUS

## 2015-07-26 MED ORDER — SODIUM CHLORIDE 0.9 % IV SOLN
Freq: Once | INTRAVENOUS | Status: AC
  Administered 2015-07-26: 15:00:00 via INTRAVENOUS

## 2015-07-26 NOTE — Progress Notes (Signed)
Notified ELink regarding pts HR now reaching 140s.  No new orders, will continue to monitor.

## 2015-07-26 NOTE — Progress Notes (Signed)
UR Completed. Damyiah Moxley, RN, BSN.  [phone] 

## 2015-07-26 NOTE — Progress Notes (Signed)
[facility] for Infectious Disease    Date of Admission:  07/17/2015   Total days of antibiotics 10        Day 10 vanco        Day 9 cefepime        Day 5 azithromycin   ID: Curtis Frazier is a 75 y.o. male with pneumonia in immunocompromised host Principal Problem:   Sepsis ([place]) Active Problems:   Essential hypertension, benign   Coronary atherosclerosis   Rheumatoid arthritis ([place])   Acute encephalopathy   LLL pneumonia   Type II or unspecified type diabetes mellitus without mention of complication, not stated as uncontrolled   S/P lumbar laminectomy   Elevated troponin   Acute respiratory failure with hypoxia (HCC)   Atrial fibrillation with RVR (HCC)   Acute pulmonary edema (HCC)   HCAP (healthcare-associated pneumonia)   Palliative care encounter   DNR (do not resuscitate) discussion   Acute respiratory failure ([place])   Endotracheally intubated   CHF (congestive heart failure) (HCC)    Subjective: Remains afebrile  Interval hx: attempted to vent wean but became hypoxic, agitated, returned into afib. Previously 3 days ago, he would follow commands when sedation was lowered but today does not follow commands. yestserday had bronch which helped with mucous plug  Medications:  . sodium chloride   Intravenous Once  . antiseptic oral rinse  7 mL Mouth Rinse QID  . chlorhexidine gluconate  15 mL Mouth Rinse BID  . feeding supplement (PRO-STAT SUGAR FREE 64)  60 mL Per Tube QID  . feeding supplement (VITAL HIGH PROTEIN)  1,000 mL Per Tube Q24H  . folic acid  1 mg Per Tube Daily  . hydrocortisone sod succinate (SOLU-CORTEF) inj  50 mg Intravenous Q6H  . insulin aspart  0-15 Units Subcutaneous 6 times per day  . insulin NPH Human  15 Units Subcutaneous BID AC & HS  . levalbuterol  0.63 mg Nebulization Q6H  . meropenem (MERREM) IV  1 g Intravenous Q12H  . [START ON 07/27/2015] pantoprazole sodium  40 mg Per Tube Daily  . phytonadione (VITAMIN K) IV  10 mg Intravenous  Once  . polyethylene glycol  17 g Oral Daily  . sodium chloride flush  10-40 mL Intracatheter Q12H  . [START ON 07/27/2015] vancomycin  750 mg Intravenous Q24H    Objective: Vital signs in last 24 hours: Temp:  [97.7 F (36.5 C)-98.7 F (37.1 C)] 98.7 F (37.1 C) (02/13 1150) Pulse Rate:  [35-132] 113 (02/13 1335) Resp:  [23-24] 24 (02/13 1335) BP: (75-109)/(47-81) 93/65 mmHg (02/13 1335) SpO2:  [88 %-100 %] 95 % (02/13 1335) FiO2 (%):  [40 %-60 %] 50 % (02/13 1200) Weight:  [212 lb 11.9 oz (96.5 kg)] 212 lb 11.9 oz (96.5 kg) (02/13 0359) Physical Exam  Constitutional: sedated. He appears flushed. well-developed and well-nourished. No distress.  HENT: intubated   Cardiovascular: irreg, irreg. Tachy. Exam reveals no gallop and no friction rub.  No murmur heard.  Pulmonary/Chest: Effort normal and breath sounds normal. No respiratory distress. He has no wheezes.  Abdominal: Soft. Bowel sounds are normal. He exhibits no distension. There is no tenderness.  Skin: Skin is warm and dry. No rash noted. No erythema.    Lab Results  Recent Labs  07/25/15 0522 07/25/15 2011 07/26/15 0422  WBC 8.1  --  7.7  HGB 7.8*  --  8.7*  HCT 24.6*  --  28.7*  NA 147* 145 142  K 2.9* 4.4 4.4  CL 116* 111 107  CO2 20* 25 23  BUN 55* 63* 72*  CREATININE 1.30* 1.45* 1.51*   Microbiology: Legionella negative cx ngtd. resp cx from 2/12 reincubating Studies/Results: Dg Chest Port 1 View  07/26/2015  CLINICAL DATA:  Follow-up pneumonia EXAM: PORTABLE CHEST 1 VIEW COMPARISON:  Portable chest x-ray of May 23, 2016 FINDINGS: The lungs are mildly hypoinflated. There is left lower lobe atelectasis or pneumonia with small left pleural effusion, stable. Mild infrahilar interstitial prominence on the right also stable. The cardiac silhouette remains enlarged. The central pulmonary vascularity is mildly prominent. The endotracheal tube tip lies 3.9 cm above the carina. The esophagogastric tube tip  projects below the inferior margin of the image. The right-sided PICC line tip projects over the midportion of the SVC. IMPRESSION: Persistent left lower lobe atelectasis and small left pleural effusion. Mild central pulmonary vascular congestion with mild interstitial edema not significantly changed. The support tubes are in reasonable position. Electronically Signed   By: David  Martinique M.D.   On: 07/26/2015 07:21   Dg Chest Port 1 View  07/25/2015  CLINICAL DATA:  75 year old male with sepsis. EXAM: PORTABLE CHEST 1 VIEW COMPARISON:  07/23/2015 and prior studies FINDINGS: The patient is rotated. An endotracheal tube with tip 4 cm above carina, right PICC line with tip overlying the superior cavoatrial junction, NG tube entering the stomach with tip off the field of view again noted. Persistent right lower lung airspace disease/ atelectasis, right perihilar opacity/atelectasis and left lower lung consolidations/atelectasis noted. Pulmonary vascular congestion has decreased. There may be trace pleural effusions. There is no evidence of pneumothorax. IMPRESSION: Decreased pulmonary vascular congestion without other significant change. Continued atelectasis/ airspace disease/ consolidation as described. Electronically Signed   By: Margarette Canada M.D.   On: 07/25/2015 07:26     Assessment/Plan: 75yo M with history of RA previously on humara and mtx with low dose pred, CAD, who presents with fever, respiratory distress now intubated with little response to HCAP coverage. Due to being immunocompromised host , potentially at risk for atypical pulmonary infection such as nocardia. UA does not suggest pyuria to be c/w uti, and mri of spine only shows post op changes with incision looking clean at admission  - would continue with vanco. Will switch cefepime to meropenem to see if any difference in response -  Recommend to get eeg to see if he is having NCSE - continue with vent support and weaning trial - thus far  cultures have not grown any atypical organisms to change treatment - agree with plan to get LP and see if CSF shows signs of abnormality, but maybe difficult to interpret since he has been on abtx.   Baxter Flattery [facility] Cell: [phone] Pager: [phone]  07/26/2015, 2:43 PM

## 2015-07-26 NOTE — Procedures (Signed)
ELECTROENCEPHALOGRAM REPORT  Patient: Curtis Frazier       Room #: [id number] EEG No. ID: [id number] Age: 75 y.o.        Sex: male Referring Physician: Vaughan Browner, Mamie Nick Report Date:  07/26/2015        Interpreting Physician: Anthony Sar  History: Curtis Frazier is an 75 y.o. male status post lumbar surgery on 07/15/2015 with postop course complicated by sepsis and respiratory failure with marked confusion and hypoxia, requiring intubation and mechanical ventilation.  Indications for study:  Assess severity of encephalopathy; rule out seizure activity.  Technique: This is an 18 channel routine scalp EEG performed at the bedside with bipolar and monopolar montages arranged in accordance to the international 10/20 system of electrode placement.   Description: Patient was intubated and on mechanical ventilation at the time of this study. He was also sedated with propofol and fentanyl. Predominant activity consisted of low to moderate amplitude diffuse continuous irregular delta and theta activity which was symmetrical. Delta activity was more prominent, however. Photic stimulation was not performed. No epileptiform discharges were recorded.  Interpretation: This EEG showed severe generalized slowing of cerebral activity continuously which was nonspecific, and can be seen with metabolic and toxic encephalopathies as well as with degenerative nervous system disorders. Slowing of cerebral activity may in part be due to sedating medications, as well. No evidence of an epileptic disorder was demonstrated.   Rush Farmer M.D. [facility] [phone]

## 2015-07-26 NOTE — Progress Notes (Signed)
Pharmacy Antibiotic Note  Curtis Frazier is a 75 y.o. male admitted on 07/17/2015 with AMS.  Pharmacy has been consulted for meropenem and vancomycin dosing for possible pneumonia in an immunocompromised host.  Plan: Continue vancomycin 750 mg IV q24h Start meropenem 1 gm IV q12h F/u EEG, cx, renal function, clinical course, LOT  Height: 5\' 8"  (172.7 cm) Weight: 212 lb 11.9 oz (96.5 kg) IBW/kg (Calculated) : 68.4  Temp (24hrs), Avg:98.2 F (36.8 C), Min:97.7 F (36.5 C), Max:98.7 F (37.1 C)   Recent Labs Lab 07/20/15 1741  07/21/15 1045 07/22/15 0824  07/23/15 0448 07/24/15 0430 07/25/15 0522 07/25/15 2011 07/26/15 0422  WBC  --   < >  --  13.7*  --  12.1* 10.8* 8.1  --  7.7  CREATININE  --   < >  --  1.65*  < > 1.50* 1.32* 1.30* 1.45* 1.51*  LATICACIDVEN  --   --  1.2  --   --   --   --   --   --   --   VANCOTROUGH 19  --   --   --   --   --   --   --   --  33*  < > = values in this interval not displayed.  Estimated Creatinine Clearance: 46.9 mL/min (by C-G formula based on Cr of 1.51).    Allergies  Allergen Reactions  . Ace Inhibitors Other (See Comments)    Causes high K  . Angiotensin Receptor Blockers Other (See Comments)    Causes high K  . Metoprolol Other (See Comments)    May cause elevated K level  . Penicillins Swelling and Rash  . Tetracycline Swelling and Rash    Antimicrobials this admission: Meropenem 2/13 >> Vanc 2/4 >> Azithromycin 2/9 >> (2/14) * 2/7 VT = 19 on 750 mg IV q12h * 2/13 VT 33 (drawn early), may be closer to 28 - decrease to 750 mg IV q24h Ampicillin 2/4>>2/5 Ceftriaxone 2/4>>2/4 Cefepime 2/5 >> 2/13  Dose adjustments this admission: 2/7 VT = 19 on 750 mg IV q12h 2/13 VT 33 (drawn early), may be closer to 28 - decrease to 750 mg IV q24h  Microbiology results: 2/4 MRSA PCR Nasal Neg 2/4 BCx2: neg 2/4 UCx: mult species 2/8 PCP smear: neg 2/8 CMV pcr: neg 2/8 Fungal cx: 2/8 AFB cx: 2/8 RSV: negative 2/8 Bronch:  2/10  strep pneumo neg 2/10 legionella neg 2/8 Viral Panel Neg 2/12 BAL:   Thank you for allowing pharmacy to be a part of this patient's care.  Cassie L. Nicole Kindred, PharmD PGY2 Infectious Diseases Pharmacy Resident Pager: [phone] 07/26/2015 2:10 PM

## 2015-07-26 NOTE — Progress Notes (Signed)
Patient Name: Curtis Frazier Date of Encounter: 07/26/2015   SUBJECTIVE  Sedated and ventilated. Again back in afib.   CURRENT MEDS . antiseptic oral rinse  7 mL Mouth Rinse QID  . azithromycin  500 mg Intravenous Q24H  . ceFEPime (MAXIPIME) IV  2 g Intravenous Q24H  . chlorhexidine gluconate  15 mL Mouth Rinse BID  . feeding supplement (PRO-STAT SUGAR FREE 64)  60 mL Per Tube QID  . feeding supplement (VITAL HIGH PROTEIN)  1,000 mL Per Tube Q24H  . folic acid  1 mg Per Tube Daily  . free water  300 mL Per Tube Q6H  . furosemide  40 mg Intravenous Q12H  . hydrocortisone sod succinate (SOLU-CORTEF) inj  25 mg Intravenous Q12H  . insulin aspart  0-15 Units Subcutaneous 6 times per day  . insulin NPH Human  10 Units Subcutaneous Q12H  . levalbuterol  0.63 mg Nebulization Q6H  . pantoprazole (PROTONIX) IV  40 mg Intravenous Daily  . polyethylene glycol  17 g Oral Daily  . sodium chloride flush  10-40 mL Intracatheter Q12H  . [START ON 07/27/2015] vancomycin  750 mg Intravenous Q24H    OBJECTIVE  Filed Vitals:   07/26/15 0915 07/26/15 0930 07/26/15 0945 07/26/15 1000  BP: 105/66 109/75 103/69 100/81  Pulse: 132 122 125 126  Temp:      TempSrc:      Resp:  24 24 24   Height:      Weight:      SpO2: 94% 93% 93% 93%    Intake/Output Summary (Last 24 hours) at 07/26/15 1008 Last data filed at 07/26/15 0918  Gross per 24 hour  Intake 5149.77 ml  Output   2585 ml  Net 2564.77 ml   Filed Weights   07/24/15 0211 07/25/15 0500 07/26/15 0359  Weight: 209 lb 7 oz (95 kg) 211 lb 6.7 oz (95.9 kg) 212 lb 11.9 oz (96.5 kg)    PHYSICAL EXAM  General: Ill appearing. Sedated and ventilated Neuro: sedated Psych: sedated HEENT: ETT in place  Neck: Supple  Lungs:  CTA anteriorly Heart: IR IR with tachycardia Abdomen: Soft, non-distended, BS + x 4.  Extremities: No clubbing, cyanosis. Stocking in place  Accessory Clinical Findings  CBC  Recent Labs  07/25/15 0522  07/26/15 0422  WBC 8.1 7.7  NEUTROABS  --  5.1  HGB 7.8* 8.7*  HCT 24.6* 28.7*  MCV 96.1 99.3  PLT 319 [id number]   Basic Metabolic Panel  Recent Labs  07/24/15 0430  07/25/15 2011 07/26/15 0422  NA 142  < > 145 142  K 3.8  < > 4.4 4.4  CL 111  < > 111 107  CO2 21*  < > 25 23  GLUCOSE 173*  < > 244* 247*  BUN 50*  < > 63* 72*  CREATININE 1.32*  < > 1.45* 1.51*  CALCIUM 7.2*  < > 7.1* 7.1*  MG 2.3  --   --   --   PHOS 3.5  --   --   --   < > = values in this interval not displayed. Liver Function Tests  Recent Labs  07/25/15 0522 07/26/15 0422  AST 27 24  ALT 26 29  ALKPHOS 67 85  BILITOT 0.2* 0.3  PROT 4.4* 5.7*  ALBUMIN 1.7* 1.9*   No results for input(s): LIPASE, AMYLASE in the last 72 hours. Cardiac Enzymes  Recent Labs  07/26/15 0422  TROPONINI 0.16*     Recent  Labs  07/26/15 0422  TRIG 124    TELE  afib at rate of 120-130s  Radiology/Studies  Ct Head Wo Contrast  07/17/2015  CLINICAL DATA:  pt awoke this morning "not acting right" has been experiencing acute confusion. Pt w/ back surgery x2 days ago, FEVER 102oriented to location, disoriented to situation and time EXAM: CT HEAD WITHOUT CONTRAST TECHNIQUE: Contiguous axial images were obtained from the base of the skull through the vertex without intravenous contrast. COMPARISON:  None. FINDINGS: Severe diffuse age-related atrophy with moderate low attenuation in the deep white matter. No evidence of vascular territory infarct or mass. No hemorrhage or extra-axial fluid. No hydrocephalus. The calvarium is intact. There is near complete opacification of the right maxillary sinus. Several bilateral ethmoid air cells are opacified or show inflammatory change particularly on the right. Several mastoid air cells on the right are opacified. IMPRESSION: Age-related involutional change. Significant inflammatory change involving the maxillary sinus, with inflammatory change also involving right mastoid air cells and  ethmoid air cells. Electronically Signed   By: Skipper Cliche M.D.   On: 07/17/2015 19:55   Ct Angio Chest Pe W/cm &/or Wo Cm  07/17/2015  CLINICAL DATA:  Back surgery 3 days ago with severe chest pain and shortness of breath. EXAM: CT ANGIOGRAPHY CHEST WITH CONTRAST TECHNIQUE: Multidetector CT imaging of the chest was performed using the standard protocol during bolus administration of intravenous contrast. Multiplanar CT image reconstructions and MIPs were obtained to evaluate the vascular anatomy. CONTRAST:  146mL OMNIPAQUE IOHEXOL 350 MG/ML SOLN COMPARISON:  07/05/2011 FINDINGS: THORACIC INLET/BODY WALL: Bilateral thyroid nodules measuring up to 11 mm, likely stable from 2013 when allowing streak artifact on previous study. No acute finding MEDIASTINUM: Mild cardiomegaly. No pericardial effusion. There is mild to moderate aortic valve calcifications/sclerosis without visible progression since 2013. Diffuse atherosclerosis, including the coronary arteries. No evidence of acute aortic syndrome. CTA of the pulmonary arteries is limited by bolus dispersion and respiratory motion at the bases, primarily affecting subsegmental vessels. There is no visible pulmonary embolism. Patulous esophagus. LUNG WINDOWS: Streaky opacities in the lower lungs with volume loss consistent with atelectasis. Intermittent airway mucus/debris, also seen on the previous study. Interlobular septal thickening at the apices compatible with early edema. Probable emphysema, with uncertainty related to respiratory motion. UPPER ABDOMEN: Cholelithiasis. OSSEOUS: T10 to lumbar posterior fixation. There are compression fractures of the T5, T7, and T8 vertebral bodies without acute/unhealed fracture line visible. Progressive and advanced adjacent segment disc degeneration above the fusion. Review of the MIP images confirms the above findings. IMPRESSION: 1. No evidence of pulmonary embolism. 2. Bibasilar atelectasis. 3. Early pulmonary edema. 4.  Study degraded by respiratory motion. Electronically Signed   By: Monte Fantasia M.D.   On: 07/17/2015 21:37   Mr Jeri Cos [id number] Contrast  07/21/2015  CLINICAL DATA:  75 year old male with recent back surgery. Fever, confusion, sepsis. Intubated. Initial encounter. EXAM: MRI HEAD WITHOUT AND WITH CONTRAST TECHNIQUE: Multiplanar, multiecho pulse sequences of the brain and surrounding structures were obtained without and with intravenous contrast. CONTRAST:  17mL MULTIHANCE GADOBENATE DIMEGLUMINE 529 MG/ML IV SOLN COMPARISON:  Head CT without contrast 07/17/2015. FINDINGS: No restricted diffusion to suggest acute infarction. No midline shift, mass effect, evidence of mass lesion, ventriculomegaly, extra-axial collection or acute intracranial hemorrhage. Cervicomedullary junction and pituitary are within normal limits. Negative visualized cervical spine. Major intracranial vascular flow voids are preserved. Relf and white matter signal is within normal limits for age throughout the brain. No cortical  encephalomalacia or chronic cerebral blood products. No abnormal enhancement identified. No dural thickening. Intubated. Small volume fluid in the pharynx. Right greater than left mastoid effusions. Right greater than left paranasal sinus fluid and opacification. Other Visible internal auditory structures appear normal. Postoperative changes to both globes. Negative orbit and scalp soft tissues. Normal bone marrow signal. IMPRESSION: 1. Negative for age MRI appearance of the brain. 2. Mastoid and paranasal sinus fluid/inflammation in the setting of intubation. Electronically Signed   By: Genevie Ann M.D.   On: 07/21/2015 16:41   Mr Lumbar Spine W Wo Contrast  07/21/2015  CLINICAL DATA:  Status post lumbar laminectomy 2 days ago. History of rheumatoid arthritis. Fever and sepsis. Question infection. EXAM: MRI LUMBAR SPINE WITHOUT AND WITH CONTRAST TECHNIQUE: Multiplanar and multiecho pulse sequences of the lumbar spine were  obtained without and with intravenous contrast. CONTRAST:  20 cc MultiHance IV. COMPARISON:  MRI lumbar spine 06/09/2015. Single intraoperative view of the lumbar spine 07/15/2015. FINDINGS: The patient is status post T10-L5 fusion with pedicle screws and stabilization bars in place. Extensive artifact from hardware. No evidence of discitis or osteomyelitis is identified. Vertebral body height and alignment are maintained. Descending nerve roots appear clumped from L3-4 to mid L5 suggestive of arachnoiditis. Imaged intra-abdominal contents are unremarkable. The T9-10 to T11-12 levels are imaged in the sagittal plane only. The central canal and foramina appear open. The T12-L1 to L2-3 levels are obscured on axial imaging by artifact from hardware. L3-4: Status post fusion. The central canal and foramina appear open. L4-5: Status post laminectomy and fusion. The central canal and foramina appear open. L5-S1: The patient has a new right laminotomy defect. There is a fluid collection in the surgical bed measuring approximately 3.7 cm AP by 1.1 cm transverse by 2.3 cm craniocaudal. Fluid extends into the posterior subcutaneous tissues. Posterior to the L5 vertebral body, there is a focus most compatible with a disc protrusion measuring 2.4 cm transverse by 1.1 cm AP x 2.1 cm craniocaudal. The central canal appears decompressed and the disc is smaller than on the prior exam. The foramina are open. IMPRESSION: Interval right laminotomy at L5-S1 for discectomy. Findings compatible with a disc protrusion are identified. The protrusion appears smaller than on the prior examination and the central canal and foramina are decompressed. Fluid collection in the laminectomy bed extending into the subcutaneous soft tissues cannot be definitively characterized but is likely a postoperative seroma given the patient's recent surgery. Status post T10-L5 fusion. The central canal and foramina appear open at these levels although multiple  levels are obscured by artifact. 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 Electronically Signed   By: Inge Rise M.D.   On: 07/21/2015 16:50   Dg Chest Port 1 View  07/26/2015  CLINICAL DATA:  Follow-up pneumonia EXAM: PORTABLE CHEST 1 VIEW COMPARISON:  Portable chest x-ray of May 23, 2016 FINDINGS: The lungs are mildly hypoinflated. There is left lower lobe atelectasis or pneumonia with small left pleural effusion, stable. Mild infrahilar interstitial prominence on the right also stable. The cardiac silhouette remains enlarged. The central pulmonary vascularity is mildly prominent. The endotracheal tube tip lies 3.9 cm above the carina. The esophagogastric tube tip projects below the inferior margin of the image. The right-sided PICC line tip projects over the midportion of the SVC. IMPRESSION: Persistent left lower lobe atelectasis and small left pleural effusion. Mild central pulmonary vascular congestion with mild interstitial edema not significantly changed. The support  tubes are in reasonable position. Electronically Signed   By: David  Martinique M.D.   On: 07/26/2015 07:21   Dg Chest Port 1 View  07/25/2015  CLINICAL DATA:  74 year old male with sepsis. EXAM: PORTABLE CHEST 1 VIEW COMPARISON:  07/23/2015 and prior studies FINDINGS: The patient is rotated. An endotracheal tube with tip 4 cm above carina, right PICC line with tip overlying the superior cavoatrial junction, NG tube entering the stomach with tip off the field of view again noted. Persistent right lower lung airspace disease/ atelectasis, right perihilar opacity/atelectasis and left lower lung consolidations/atelectasis noted. Pulmonary vascular congestion has decreased. There may be trace pleural effusions. There is no evidence of pneumothorax. IMPRESSION: Decreased pulmonary vascular congestion without other significant change. Continued atelectasis/ airspace disease/ consolidation as  described. Electronically Signed   By: Margarette Canada M.D.   On: 07/25/2015 07:26   Dg Chest Port 1 View  07/23/2015  CLINICAL DATA:  75 year old male with ventilator dependent respiratory failure. Sepsis. Initial encounter. EXAM: PORTABLE CHEST 1 VIEW COMPARISON:  07/22/2015 and earlier. FINDINGS: Portable AP semi upright view at 0405 hours. Stable endotracheal tube position. Enteric tube courses to the abdomen, tip not included. Stable right PICC line. Stable cardiomegaly and mediastinal contours. Interval decreased veiling opacity in the lungs but continued dense retrocardiac opacity at both bases. Partially visible spinal fusion hardware appears stable. No pneumothorax. No overt edema. IMPRESSION: 1.  Stable lines and tubes. 2. Bilateral pleural effusions are less apparent. No overt edema. Continued dense bilateral lower lobe collapse or consolidation. Electronically Signed   By: Genevie Ann M.D.   On: 07/23/2015 06:36   Dg Chest Port 1 View  07/22/2015  CLINICAL DATA:  Ventilator dependent respiratory failure, coronary artery disease, sepsis, acute encephalopathy, left lower lobe pneumonia. EXAM: PORTABLE CHEST 1 VIEW COMPARISON:  Portable chest x-ray of July 21, 2015 FINDINGS: The lungs are reasonably well inflated. There are bilateral pleural effusions layering posteriorly. There is left lower lobe atelectasis or pneumonia. The cardiac silhouette remains enlarged. The pulmonary vascularity remains engorged. The pulmonary interstitial markings are slightly more conspicuous overall today. The endotracheal tube tip lies 4.7 cm above the carina. The esophagogastric tube tip projects below the inferior margin of the image. The PICC line tip projects over the midportion of the SVC. The patient has undergone lower thoracic lumbar an upper lumbar posterior fusion. IMPRESSION: CHF with mild pulmonary interstitial edema slightly more conspicuous today. Persistent left lower lobe atelectasis or pneumonia. Persistent  bilateral pleural effusions layering posteriorly. The support tubes are in reasonable position. Electronically Signed   By: David  Martinique M.D.   On: 07/22/2015 08:33   Portable Chest Xray  07/21/2015  CLINICAL DATA:  Hypoxia EXAM: PORTABLE CHEST 1 VIEW COMPARISON:  July 21, 2015 FINDINGS: Endotracheal tube tip is 4.4 cm above the carina. Nasogastric tube tip and side port are below the diaphragm. Central catheter tip is in the superior vena cava. No pneumothorax. There is persistent pulmonary vascular congestion. There is increase in opacity in the left base region. There is postoperative change in the lower thoracic and visualized upper lumbar region. IMPRESSION: Tube and catheter positions as described without pneumothorax. Increased consolidation left base ; question developing pneumonia or possibly aspiration. Pulmonary vascular congestion with a degree of congestive heart failure remains without change. Electronically Signed   By: Lowella Grip III M.D.   On: 07/21/2015 11:14   Dg Chest Port 1 View  07/21/2015  CLINICAL DATA:  Decreased oxygen saturation EXAM:  PORTABLE CHEST 1 VIEW COMPARISON:  July 19, 2015 FINDINGS: Central catheter tip is in the superior vena cava. No pneumothorax. Interstitial edema remains, stable. There is no airspace consolidation. There is cardiomegaly with mild pulmonary venous hypertension. No new opacity. No adenopathy. There is postoperative change in the lower thoracic and visualized upper lumbar region. IMPRESSION: Evidence of a degree of congestive heart failure, stable. No new opacity. No change in cardiac silhouette. Central catheter tip in superior vena cava. No pneumothorax evident. Electronically Signed   By: Lowella Grip III M.D.   On: 07/21/2015 07:52   Dg Chest Port 1 View  07/19/2015  CLINICAL DATA:  Respiratory distress EXAM: PORTABLE CHEST 1 VIEW COMPARISON:  07/18/2015 FINDINGS: Cardiomegaly with vascular congestion and diffuse interstitial  prominence compatible with edema. Right PICC line is in place with the tip at the cavoatrial junction. Possible layering left effusion. No acute bony abnormality. IMPRESSION: Findings compatible with CHF.  Possible small left effusion. Electronically Signed   By: Rolm Baptise M.D.   On: 07/19/2015 13:40   Dg Chest Port 1 View  07/18/2015  CLINICAL DATA:  Line placement EXAM: PORTABLE CHEST 1 VIEW COMPARISON:  07/18/2015 FINDINGS: Right-sided PICC line has been placed, tip overlying the level of the superior vena cava. The heart is enlarged. There is pulmonary vascular congestion. Opacity at the medial left lung base is increased, consistent with developing infiltrate and/or atelectasis. IMPRESSION: 1. Right-sided PICC line tip to the superior vena cava. 2. Developing left lower lobe infiltrate and/or atelectasis. Electronically Signed   By: Nolon Nations M.D.   On: 07/18/2015 14:04   Dg Chest Port 1 View  07/18/2015  CLINICAL DATA:  Shortness of breath. EXAM: PORTABLE CHEST 1 VIEW COMPARISON:  July 17, 2015. FINDINGS: Stable cardiomegaly. No pneumothorax or pleural effusion is noted. Stable mild bibasilar subsegmental atelectasis is noted. Bony thorax is unremarkable. IMPRESSION: Stable mild bibasilar subsegmental atelectasis. Electronically Signed   By: Marijo Conception, M.D.   On: 07/18/2015 09:06   Dg Chest Port 1 View  07/17/2015  CLINICAL DATA:  Pt w/ back surgery x2 days ago, [facility] report temp of 102.0 on arrival and pt given 1gm APAP, pt also w/ SPO2 of 86% on room air, pt placed on simple mask at 6L/min and SPO2 up to 96%. Pt now under sepsis protocol and is experiencing acute confusion EXAM: PORTABLE CHEST 1 VIEW COMPARISON:  01/15/2015 FINDINGS: Heart is mildly enlarged. There are no focal consolidations or pleural effusions. There is mild left lower lobe subsegmental atelectasis or scarring. Less likely this could represent early infectious infiltrate. No evidence for pulmonary edema. Previous  thoracic spine fusion. IMPRESSION: 1. Cardiomegaly without pulmonary edema. 2. Left lower lobe atelectasis versus early infiltrate. Followup is recommended. Electronically Signed   By: Nolon Nations M.D.   On: 07/17/2015 18:06    ASSESSMENT AND PLAN 75 yo who is s/p recent lumbar surgery on 2/2. Following d/c on 2/4 developed acute confusion and worsening weakness w/ fever > 102. He was brought to the ER. Admitted w/ working dx of sepsis felt due to PNA. Course complicated by shock and atrial fibrillation with RVR.    1. Acute hypoxemic respiratory failure: HCAP in immunocompromised host (RA meds). On broad spectrum antibiotics per ID, CCM. Suspect this is primarily due to PNA rather than CHF/volume overload.   2. Atrial fibrillation with RVR: Echo with normal EF, no significant valvular abnormalities. was in  sinus rhythm. Converted to afib with RVR around  1800 07/25/15. Continue IB amiodarone and IV heparin  3. Hypotension: Suspect septic shock. per primary team   4. CAD: known h/o CAD  Patient examined chart reviewed discussed care with Dr [name] and daughter Intubated rhonchi.  Telemetry with rapid afib now Will rebolus with amiodarone  And continue drip.  Jenkins Rouge  5. Elevated troponin likely due to demand ischemia  Signed, Bhagat,Bhavinkumar PA-C Pager [phone]

## 2015-07-26 NOTE — Progress Notes (Signed)
PULMONARY / CRITICAL CARE MEDICINE   Name: Curtis Frazier MRN: [id number] DOB: 18-Feb-1941    ADMISSION DATE:  07/17/2015 CONSULTATION DATE: 2/8  REFERRING MD:  Hongalgi   CHIEF COMPLAINT:  Acute respiratory failure and sepsis   HISTORY OF PRESENT ILLNESS:   30 yom who is s/p recent lumbar surg on 2/2. Following d/c on 2/4 developed acute confusion and worsening weakness w/ fever > 102. He was brought to the ER. Admitted w/ working dx of sepsis felt d/t PNA +/- UTI. His hospital course was c/b intermittent fever, AF w/ RVR, hypoxia and confusion. He was treated w/ IVFs and empiric abx. On 2/7 apparently had been doing better. On 2/8 developed increased SOB, for which he was placed on NIPPV, this was followed again by fever and then worsening agitation. He had significant hypoxia w/ P/F ratio of  77. PCCM was asked to assess after his agitation escalated to point he would no longer keep his BIPAP on.   SUBJECTIVE:   No fevers Net pos 6.9L  Bronchoscopy yesterday > LLL fully collapsed pus thick hard extensive, lavaged clean, BAL Elevated HRs  VITAL SIGNS: BP 97/61 mmHg  Pulse 65  Temp(Src) 98.7 F (37.1 C) (Oral)  Resp 24  Ht 5\' 8"  (1.727 m)  Wt 212 lb 11.9 oz (96.5 kg)  BMI 32.36 kg/m2  SpO2 92%  HEMODYNAMICS: CVP:  [12 mmHg] 12 mmHg  VENTILATOR SETTINGS: Vent Mode:  [-] PRVC FiO2 (%):  [40 %-60 %] 40 % Set Rate:  [18 bmp-26 bmp] 24 bmp Vt Set:  [550 mL] 550 mL PEEP:  [5 cmH20] 5 cmH20 Plateau Pressure:  [15 cmH20-19 cmH20] 19 cmH20  INTAKE / OUTPUT: I/O last 3 completed shifts: In: 6819.7 [I.V.:4754.7; NG/GT:1215; IV Piggyback:850] Out: 5685 [Urine:5685]  PHYSICAL EXAMINATION:  General:  Chronically ill appearing elderly white male, Sedated Neuro: sedated, rass 0, not FC but on propofol HEENT: ETT, jvd Cardiovascular:  s1 s2 IRT no r Lungs:  reduced bilat with some ronchi, anterior clear Abdomen:  Soft, not tender. + bowel sounds  Musculoskeletal:  Equal st and bulk.  No edema Skin:  Warm, dry, intact.  LABS:  BMET  Recent Labs Lab 07/25/15 0522 07/25/15 2011 07/26/15 0422  NA 147* 145 142  K 2.9* 4.4 4.4  CL 116* 111 107  CO2 20* 25 23  BUN 55* 63* 72*  CREATININE 1.30* 1.45* 1.51*  GLUCOSE 205* 244* 247*   Electrolytes  Recent Labs Lab 07/22/15 0824 07/22/15 2015 07/23/15 0448 07/24/15 0430 07/25/15 0522 07/25/15 2011 07/26/15 0422  CALCIUM 7.3* 7.4* 7.1* 7.2* 6.4* 7.1* 7.1*  MG 1.4* 2.3 2.4 2.3  --   --   --   PHOS 2.4*  --  4.2 3.5  --   --   --     CBC  Recent Labs Lab 07/24/15 0430 07/25/15 0522 07/26/15 0422  WBC 10.8* 8.1 7.7  HGB 8.6* 7.8* 8.7*  HCT 26.6* 24.6* 28.7*  PLT 296 319 346    Coag's No results for input(s): APTT, INR in the last 168 hours.  Sepsis Markers  Recent Labs Lab 07/21/15 1045 07/23/15 0448 07/24/15 1305 07/25/15 0522  LATICACIDVEN 1.2  --   --   --   PROCALCITON  --  7.58 3.87 3.02    ABG  Recent Labs Lab 07/21/15 0645 07/21/15 1123 07/25/15 1130  PHART 7.476* 7.359 7.274*  PCO2ART 26.6* 36.4 47.2*  PO2ART 77.4* 94.0 69.0*    Liver Enzymes  Recent  Labs Lab 07/24/15 0430 07/25/15 0522 07/26/15 0422  AST  --  27 24  ALT  --  26 29  ALKPHOS  --  67 85  BILITOT  --  0.2* 0.3  ALBUMIN 1.9* 1.7* 1.9*    Cardiac Enzymes  Recent Labs Lab 07/26/15 0422  TROPONINI 0.16*    Glucose  Recent Labs Lab 07/25/15 0740 07/25/15 1131 07/25/15 1620 07/25/15 2007 07/26/15 0023 07/26/15 0420  GLUCAP 160* 219* 209* 208* 224* 217*    Imaging Dg Chest Port 1 View  07/26/2015  CLINICAL DATA:  Follow-up pneumonia EXAM: PORTABLE CHEST 1 VIEW COMPARISON:  Portable chest x-ray of May 23, 2016 FINDINGS: The lungs are mildly hypoinflated. There is left lower lobe atelectasis or pneumonia with small left pleural effusion, stable. Mild infrahilar interstitial prominence on the right also stable. The cardiac silhouette remains enlarged. The central pulmonary  vascularity is mildly prominent. The endotracheal tube tip lies 3.9 cm above the carina. The esophagogastric tube tip projects below the inferior margin of the image. The right-sided PICC line tip projects over the midportion of the SVC. IMPRESSION: Persistent left lower lobe atelectasis and small left pleural effusion. Mild central pulmonary vascular congestion with mild interstitial edema not significantly changed. The support tubes are in reasonable position. Electronically Signed   By: David  Martinique M.D.   On: 07/26/2015 07:21   STUDIES:  2/4: CT chest images showed no evidence of a pulmonary embolism about basilar atelectasis most likely. ECHO 2/7: EF 65-70%; indeterminate of diastolic fxn, Peak PAP 32 mmHg 2/8:MRI brain & spine>>> brain unremarkable; fluid collection in laminectomy (post-op?) 2/8: EEG >> abnormal; slowing seen consistent with encephalopathy. No epileptic activity   CULTURES: BCX2 2/8>>> NGTD Respiratory culture 2/8>>> PCP 2/8>>> negative CMV >> RVP>> neg Fungus negative AFB negative  ANTIBIOTICS: vanc 2/4>>> Cefepime 2/5>>> Azithromycin 2/9>>5 days  SIGNIFICANT EVENTS: 2/2: lumbar surgery 2/4: Admitted for PNA 2/8: CCM, intubated, febrile, hypoxemia   LINES/TUBES: Oett 2/8>>> PICC RUE 2/5>>> PIV Foley  DISCUSSION: 18 yom s/p recent lumbar surgery. Admitted 2/4 w/ delirium and working dx of sepsis in setting of PNA. Course c/b AF w/ RVR, intermittent fevers and respiratory failure in spite of ABX. PCCM asked to see on 2/8 for worsening agitation, respiratory failure and distress.   ASSESSMENT / PLAN:  PULMONARY A: Acute Hypoxic Respiratory Failure Pulmonary infiltrates.  D-dx include PNA/ evolving ALI vs Pulmonary edema. He is on immunosuppression at baseline so need to consider opportunistic infection.  Unimpressed PNA Did have obstructed ett thick old secretions P:   Maintain vent support Wean cpap 5 PS likely 10 needed to 12 CXR stable this  morning, atx Pos balance allow  CARDIOVASCULAR A:  SIRS/Sepsis- resolved PAF w/ RVR Trop I elevation  P:  MAP goal >60, off pressors Cardiology following Continue amio drip , bolus today Cont heparin gtt  Neo restart Re add neo Trop peaked at 2.06; this AM 0.16 Bolus 500 cc  RENAL A:   CKD stage 3 w/ acute on chronic renal failure  hypok Hypernatremia - resolved P:   Lasix dc Chem in am  bolus kvo Dc free water No bicarb needed  GASTROINTESTINAL A:   Last BM 10th P:   PPI for SUP Continue TF mirilax  HEMATOLOGIC A:   Anemia w/out evidence of blood loss - stable Chronic Thrombocytopenia  hemoconcetrated 2/13 P:  Trend CBC  Heparin drip hold for LP and get pt, inr, ptt  INFECTIOUS A:   Severe sepsis  source unclear.  R/o PNA - ddx- aspiration, hap, opportunistic infection - unimpressed as source Had been treating w/ working dx of PNA and also possible UTI w/ multi-org UC.  Also consider recent lumbar surgery fluid collection No fevers x 72 hr P:   Per ID Ensure proper BBB coverage, consider ceftaz from cefepime more reliable BBB? Source control? 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 P:   Insulin drip off, ssi, NPH On pressors, back to stress roids  NEUROLOGIC A:   Acute Encephalopathy - improved Recent L spine surgery  P:   RASS goal: 0 PAD protocol [facility] Neurosurgery following Propofol, may need to change to precedex Add fent for dychrony LP  FAMILY  - Updates: DF 2/11 to 3 kids - Inter-disciplinary family meet or Palliative Care meeting due by:  2/18  Ccm time 30 min   Luiz Blare, DO 07/26/2015, 7:55 AM PGY-2, [facility]  STAFF NOTE: I, Merrie Roof, MD FACP have personally reviewed patient's available data, including medical history, events of note, physical examination and test results as part of my evaluation. I have discussed with resident/NP and other care providers such as pharmacist,  RN and RRT. In addition, I personally evaluated patient and elicited key findings of: not awake, lungs sounds improved, ABg reviewed prior, rate was increased, NO fevers x 72 hrs but he is NOT progressing, IM still not impresed this is PNA as cause, over diuresis likely, dc lasix, neo back on , bolus, 1/2 NS, no role bicarb  Now, back to stress roids, wean attempts ok, no extubation planned, LP planned, increase NPH, pos balance goals, amio bolus, stop hep, get coags in 1.5 hrs The patient is critically ill with multiple organ systems failure and requires high complexity decision making for assessment and support, frequent evaluation and titration of therapies, application of advanced monitoring technologies and extensive interpretation of multiple databases.   Critical Care Time devoted to patient care services described in this note is 30 Minutes. This time reflects time of care of this signee: Merrie Roof, MD FACP. This critical care time does not reflect procedure time, or teaching time or supervisory time of PA/NP/Med student/Med Resident etc but could involve care discussion time. Rest per NP/medical resident whose note is outlined above and that I agree with   [name]. [name], MD, [facility]: [facility] 07/26/2015 10:30 AM

## 2015-07-26 NOTE — Progress Notes (Addendum)
PT/INR result called to RN at 1202- INR=10. This entry not listed in EPIC due to "credit" of this order per lab. Repeat INR 7.99 and 1.12, respectfully as listed in lab section of record. To note, third INR result obtained prior to transfusion of FFP.

## 2015-07-26 NOTE — Progress Notes (Signed)
CRITICAL VALUE ALERT  Critical value received:  Vancomycin Trough  Date of notification:  07/26/2015  Time of notification:  0625  Critical value read back:Yes.    Nurse who received alert:  Damita Dunnings  MD notified (1st page):  Warren Lacy  Time of first page:  0625  MD notified (2nd page):  Time of second page:  Responding MD:  Kathlee Nations  Time MD responded:  [phone]

## 2015-07-26 NOTE — Progress Notes (Signed)
EEG Completed; Results Pending  

## 2015-07-26 NOTE — Progress Notes (Signed)
Pharmacy Antibiotic Note  Curtis Frazier is a 75 y.o. male admitted on 07/17/2015 with sepsis.  Pharmacy has been consulted for vancomycin dosing, currently on day 10.  Vanc trough 33, drawn ~1.5hr early, next dose given prior to result  Plan: The dose of vancomycin will be adjusted to 750mg  IV Q24H based on renal function.  Will also confirm trough since next dose was already given.  Height: 5\' 8"  (172.7 cm) Weight: 212 lb 11.9 oz (96.5 kg) IBW/kg (Calculated) : 68.4  Temp (24hrs), Avg:98.3 F (36.8 C), Min:97.7 F (36.5 C), Max:99.2 F (37.3 C)   Recent Labs Lab 07/20/15 1741  07/21/15 1045 07/22/15 0824  07/23/15 0448 07/24/15 0430 07/25/15 0522 07/25/15 2011 07/26/15 0422  WBC  --   < >  --  13.7*  --  12.1* 10.8* 8.1  --  7.7  CREATININE  --   < >  --  1.65*  < > 1.50* 1.32* 1.30* 1.45* 1.51*  LATICACIDVEN  --   --  1.2  --   --   --   --   --   --   --   VANCOTROUGH 19  --   --   --   --   --   --   --   --  33*  < > = values in this interval not displayed.  Estimated Creatinine Clearance: 46.9 mL/min (by C-G formula based on Cr of 1.51).    Allergies  Allergen Reactions  . Ace Inhibitors Other (See Comments)    Causes high K  . Angiotensin Receptor Blockers Other (See Comments)    Causes high K  . Metoprolol Other (See Comments)    May cause elevated K level  . Penicillins Swelling and Rash  . Tetracycline Swelling and Rash    Antimicrobials this admission: Cefepime 2/5>>  Vanc 2/4 >>  Azithromycin 2/9 >>(2/14)  Ampicillin 2/4>>2/5  Ceftriaxone 2/4>>2/4   Dose adjustments this admission: 2/7 vanc trough = 19 on 750 mg IV q12h (no change) 2/13 vanc trough = 33 on 750mg  IV Q12H --> change to 750mg  IV Q24H  Microbiology results: 2/4 MRSA PCR Nasal Neg  2/4 BCx2: neg  2/4 UCx: mult species  2/8 PCP smear: neg  2/8 CMV pcr:  2/8 Fungal cx: NGTD 2/8 AFB cx: NGTD 2/8 RSV:  2/8 Bronch:  2/10 strep pneumo neg  2/10 legionella neg  2/8 Viral Panel  Neg   Thank you for allowing pharmacy to be a part of this patient's care.  Wynona Neat, PharmD, BCPS  07/26/2015 6:40 AM

## 2015-07-26 NOTE — Progress Notes (Signed)
ANTICOAGULATION CONSULT NOTE  Pharmacy Consult for heparin Indication: atrial fibrillation  Patient Measurements: Height: 5\' 8"  (172.7 cm) Weight: 212 lb 11.9 oz (96.5 kg) IBW/kg (Calculated) : 68.4 Heparin Dosing Weight: 87.8 kg  Vital Signs: Temp: 97.7 F (36.5 C) (02/13 0433) Temp Source: Oral (02/13 0433) BP: 93/63 mmHg (02/13 0330) Pulse Rate: 81 (02/13 0330)  Labs:  Recent Labs  07/24/15 0430 07/25/15 0521 07/25/15 0522 07/25/15 2011 07/26/15 0422  HGB 8.6*  --  7.8*  --  8.7*  HCT 26.6*  --  24.6*  --  28.7*  PLT 296  --  319  --  346  HEPARINUNFRC 0.48 0.44  --   --  0.78*  CREATININE 1.32*  --  1.30* 1.45*  --     Estimated Creatinine Clearance: 48.8 mL/min (by C-G formula based on Cr of 1.45).   . sodium chloride 75 mL/hr at 07/25/15 1900  . amiodarone 30 mg/hr (07/25/15 1900)  . fentaNYL infusion INTRAVENOUS 125 mcg/hr (07/26/15 0211)  . heparin 1,750 Units/hr (07/26/15 0014)  . insulin (NOVOLIN-R) infusion Stopped (07/24/15 0430)  . phenylephrine (NEO-SYNEPHRINE) Adult infusion Stopped (07/24/15 1030)  . propofol (DIPRIVAN) infusion 15 mcg/kg/min (07/26/15 0410)    Assessment: 75 yo M on heparin for afib. Pt's plavix has been stopped and starting heparin was ok'ed by neurosurg. CHADSVASc = 5.   Heparin level now supratherapeutic (0.78) on 1750 units/hr. No bleeding noted per RN.   Goal of Therapy:  Heparin level 0.3-0.7 units/ml Monitor platelets by anticoagulation protocol: Yes   Plan:  Decrease heparin infusion at 1650 units/hr F/u 8 hr heparin level  Sherlon Handing, PharmD, BCPS Clinical pharmacist, pager [phone] 07/26/2015 5:25 AM

## 2015-07-26 NOTE — Progress Notes (Signed)
Patient ID: Curtis Frazier, male   DOB: [birth year], 75 y.o.   MRN: [id number] Subjective:  The patient is somnolent but arousable. He is in no apparent distress.  Objective: Vital signs in last 24 hours: Temp:  [97.7 F (36.5 C)-99.5 F (37.5 C)] 99.5 F (37.5 C) (02/13 1513) Pulse Rate:  [35-132] 106 (02/13 1800) Resp:  [23-24] 24 (02/13 1800) BP: (75-114)/(47-83) 105/73 mmHg (02/13 1800) SpO2:  [88 %-100 %] 90 % (02/13 1800) FiO2 (%):  [40 %-60 %] 50 % (02/13 1600) Weight:  [96.5 kg (212 lb 11.9 oz)] 96.5 kg (212 lb 11.9 oz) (02/13 0359)  Intake/Output from previous day: 02/12 0701 - 02/13 0700 In: 4725.7 [I.V.:3185.7; NG/GT:990; IV Piggyback:550] Out: 2885 [Urine:2885] Intake/Output this shift: Total I/O In: 2720.9 [I.V.:1426.9; Blood:479; NG/GT:465; IV Piggyback:350] Out: 750 [Urine:750]  Physical exam the patient is somnolent but arousable. He follows commands. He will squeeze my hand. He sticks his tongue out to command.  The patient's dressing is clean and dry. There is no discharge.  The patient's head CT demonstrates some possible mastoiditis but otherwise is unremarkable.  Lab Results:  Recent Labs  07/25/15 0522 07/26/15 0422  WBC 8.1 7.7  HGB 7.8* 8.7*  HCT 24.6* 28.7*  PLT 319 346   BMET  Recent Labs  07/25/15 2011 07/26/15 0422  NA 145 142  K 4.4 4.4  CL 111 107  CO2 25 23  GLUCOSE 244* 247*  BUN 63* 72*  CREATININE 1.45* 1.51*  CALCIUM 7.1* 7.1*    Studies/Results: Ct Head Wo Contrast  07/26/2015  CLINICAL DATA:  Altered mental status, difficulty breathing EXAM: CT HEAD WITHOUT CONTRAST TECHNIQUE: Contiguous axial images were obtained from the base of the skull through the vertex without intravenous contrast. COMPARISON:  07/17/2015 FINDINGS: Significant diffuse cortical atrophy with low attenuation in the deep white matter. No hydrocephalus. No evidence of infarct or mass. No hemorrhage or extra-axial fluid. Near complete opacification right  maxillary sinus unchanged. Mild inflammatory change ethmoid air cells again identified. Bilateral mastoid air cell fluid worse when compared to prior study. IMPRESSION: Stable ethmoid and right maxillary sinusitis. Increased severity of bilateral mastoid air cell fluid suggesting possibility of mastoiditis. Electronically Signed   By: Skipper Cliche M.D.   On: 07/26/2015 17:46   Ct Chest Wo Contrast  07/26/2015  CLINICAL DATA:  Altered mental status, difficulty breathing, unknown infection source EXAM: CT CHEST WITHOUT CONTRAST TECHNIQUE: Multidetector CT imaging of the chest was performed following the standard protocol without IV contrast. COMPARISON:  07/26/15 FINDINGS: The patient is intubated with endotracheal tube ending above the carina. NG tube crosses into the stomach. Right PICC line extends to the cavoatrial junction. There is extensive coronary artery calcification. There is calcification of the aortic valves and of the thoracic aorta diffusely. There is cardiac enlargement. There is a small to moderate pericardial effusion. There is a moderate left pleural effusion and a small right pleural effusion. There are mildly enlarged mediastinal lymph nodes diffusely measuring up to about 12 mm. There is consolidation in both lower lobes. Consolidation is much more extensive on the left and. Posterior dependent consolidation extends into the left upper lobe. Images through the upper abdomen demonstrate no acute findings. There are no acute musculoskeletal findings. Thoracic spine compression deformities are stable from 07/17/2015. IMPRESSION: Significant increase in pleural effusions and bilateral consolidation when compared to 07/17/2015. Consolidation may represent compressive atelectasis but particularly on the left superimposed pneumonia or pneumonitis not excluded. Small to moderate pericardial  effusion increased when compared to 07/17/2015. Electronically Signed   By: Skipper Cliche M.D.   On:  07/26/2015 17:53   Dg Chest Port 1 View  07/26/2015  CLINICAL DATA:  Follow-up pneumonia EXAM: PORTABLE CHEST 1 VIEW COMPARISON:  Portable chest x-ray of May 23, 2016 FINDINGS: The lungs are mildly hypoinflated. There is left lower lobe atelectasis or pneumonia with small left pleural effusion, stable. Mild infrahilar interstitial prominence on the right also stable. The cardiac silhouette remains enlarged. The central pulmonary vascularity is mildly prominent. The endotracheal tube tip lies 3.9 cm above the carina. The esophagogastric tube tip projects below the inferior margin of the image. The right-sided PICC line tip projects over the midportion of the SVC. IMPRESSION: Persistent left lower lobe atelectasis and small left pleural effusion. Mild central pulmonary vascular congestion with mild interstitial edema not significantly changed. The support tubes are in reasonable position. Electronically Signed   By: David  Martinique M.D.   On: 07/26/2015 07:21   Dg Chest Port 1 View  07/25/2015  CLINICAL DATA:  75 year old male with sepsis. EXAM: PORTABLE CHEST 1 VIEW COMPARISON:  07/23/2015 and prior studies FINDINGS: The patient is rotated. An endotracheal tube with tip 4 cm above carina, right PICC line with tip overlying the superior cavoatrial junction, NG tube entering the stomach with tip off the field of view again noted. Persistent right lower lung airspace disease/ atelectasis, right perihilar opacity/atelectasis and left lower lung consolidations/atelectasis noted. Pulmonary vascular congestion has decreased. There may be trace pleural effusions. There is no evidence of pneumothorax. IMPRESSION: Decreased pulmonary vascular congestion without other significant change. Continued atelectasis/ airspace disease/ consolidation as described. Electronically Signed   By: Margarette Canada M.D.   On: 07/25/2015 07:26    Assessment/Plan: Status post lumbar discectomy: The patient's lumbar puncture was postponed  because his INR was elevated. Although my suspicion is low, lumbar infection/meningitis are possible. I have discussed this with Dr. [name] I spoke to the patient's daughter.  Respiratory failure, sepsis: This is being managed by the critical care medicine doctors.  LOS: 9 days     Roverto Bodmer D 07/26/2015, 6:17 PM

## 2015-07-26 NOTE — Progress Notes (Signed)
Patient transported to CT and back to room 2M03 without any complications.

## 2015-07-27 LAB — COMPREHENSIVE METABOLIC PANEL
ALK PHOS: 156 U/L — AB (ref 38–126)
ALT: 77 U/L — ABNORMAL HIGH (ref 17–63)
ANION GAP: 11 (ref 5–15)
AST: 96 U/L — ABNORMAL HIGH (ref 15–41)
Albumin: 1.8 g/dL — ABNORMAL LOW (ref 3.5–5.0)
BILIRUBIN TOTAL: 0.5 mg/dL (ref 0.3–1.2)
BUN: 79 mg/dL — ABNORMAL HIGH (ref 6–20)
CO2: 24 mmol/L (ref 22–32)
Calcium: 7.3 mg/dL — ABNORMAL LOW (ref 8.9–10.3)
Chloride: 108 mmol/L (ref 101–111)
Creatinine, Ser: 1.49 mg/dL — ABNORMAL HIGH (ref 0.61–1.24)
GFR, EST AFRICAN AMERICAN: 51 mL/min — AB (ref >60)
GFR, EST NON AFRICAN AMERICAN: 44 mL/min — AB (ref >60)
GLUCOSE: 242 mg/dL — AB (ref 65–99)
POTASSIUM: 4.6 mmol/L (ref 3.5–5.1)
Sodium: 143 mmol/L (ref 135–145)
TOTAL PROTEIN: 5.7 g/dL — AB (ref 6.5–8.1)

## 2015-07-27 LAB — PROTIME-INR
INR: 1.25 (ref 0.00–1.49)
PROTHROMBIN TIME: 15.9 s — AB (ref 11.6–15.2)

## 2015-07-27 LAB — GLUCOSE, CAPILLARY
GLUCOSE-CAPILLARY: 292 mg/dL — AB (ref 65–99)
GLUCOSE-CAPILLARY: 219 mg/dL — AB (ref 65–99)
GLUCOSE-CAPILLARY: 162 mg/dL — AB (ref 65–99)
Glucose-Capillary: 131 mg/dL — ABNORMAL HIGH (ref 65–99)
Glucose-Capillary: 152 mg/dL — ABNORMAL HIGH (ref 65–99)
Glucose-Capillary: 196 mg/dL — ABNORMAL HIGH (ref 65–99)

## 2015-07-27 LAB — PREPARE FRESH FROZEN PLASMA
UNIT DIVISION: 0
Unit division: 0
Unit division: 0
Unit division: 0

## 2015-07-27 LAB — CULTURE, RESPIRATORY: GRAM STAIN: NONE SEEN

## 2015-07-27 LAB — CBC
HEMATOCRIT: 26.2 % — AB (ref 39.0–52.0)
HEMOGLOBIN: 8.4 g/dL — AB (ref 13.0–17.0)
MCH: 32.2 pg (ref 26.0–34.0)
MCHC: 32.1 g/dL (ref 30.0–36.0)
MCV: 100.4 fL — AB (ref 78.0–100.0)
Platelets: 303 10*3/uL (ref 150–400)
RBC: 2.61 MIL/uL — ABNORMAL LOW (ref 4.22–5.81)
RDW: 17.8 % — AB (ref 11.5–15.5)
WBC: 7 10*3/uL (ref 4.0–10.5)

## 2015-07-27 LAB — CULTURE, RESPIRATORY W GRAM STAIN

## 2015-07-27 LAB — TRIGLYCERIDES: TRIGLYCERIDES: 123 mg/dL (ref <150)

## 2015-07-27 LAB — MAGNESIUM: MAGNESIUM: 2.1 mg/dL (ref 1.7–2.4)

## 2015-07-27 LAB — APTT: aPTT: 26 seconds (ref 24–37)

## 2015-07-27 LAB — PHOSPHORUS: Phosphorus: 3.9 mg/dL (ref 2.5–4.6)

## 2015-07-27 MED ORDER — ACETYLCYSTEINE 20 % IN SOLN
4.0000 mL | Freq: Two times a day (BID) | RESPIRATORY_TRACT | Status: DC
  Administered 2015-07-27 – 2015-07-29 (×5): 4 mL via RESPIRATORY_TRACT
  Filled 2015-07-27 (×7): qty 4

## 2015-07-27 MED ORDER — ESMOLOL BOLUS VIA INFUSION
500.0000 ug/kg | Freq: Once | INTRAVENOUS | Status: AC
  Administered 2015-07-27: 49350 ug via INTRAVENOUS
  Filled 2015-07-27: qty 50000

## 2015-07-27 MED ORDER — BISACODYL 10 MG RE SUPP: 10.0000 mg | Freq: Every day | RECTAL | Status: DC | PRN

## 2015-07-27 MED ORDER — INSULIN ASPART 100 UNIT/ML ~~LOC~~ SOLN
0.0000 [IU] | SUBCUTANEOUS | Status: DC
  Administered 2015-07-27: 3 [IU] via SUBCUTANEOUS
  Administered 2015-07-27 (×2): 4 [IU] via SUBCUTANEOUS
  Administered 2015-07-28 (×2): 3 [IU] via SUBCUTANEOUS
  Administered 2015-07-28 (×2): 4 [IU] via SUBCUTANEOUS
  Administered 2015-07-29 (×2): 3 [IU] via SUBCUTANEOUS
  Administered 2015-07-30 (×4): 4 [IU] via SUBCUTANEOUS
  Administered 2015-07-30: 3 [IU] via SUBCUTANEOUS
  Administered 2015-07-30: 4 [IU] via SUBCUTANEOUS
  Administered 2015-07-31: 7 [IU] via SUBCUTANEOUS
  Administered 2015-07-31: 4 [IU] via SUBCUTANEOUS
  Administered 2015-07-31: 3 [IU] via SUBCUTANEOUS
  Administered 2015-07-31: 4 [IU] via SUBCUTANEOUS
  Administered 2015-08-01: 7 [IU] via SUBCUTANEOUS
  Administered 2015-08-01: 4 [IU] via SUBCUTANEOUS
  Administered 2015-08-01 (×3): 7 [IU] via SUBCUTANEOUS
  Administered 2015-08-02: 11 [IU] via SUBCUTANEOUS
  Administered 2015-08-02: 4 [IU] via SUBCUTANEOUS
  Administered 2015-08-02: 11 [IU] via SUBCUTANEOUS
  Administered 2015-08-02: 7 [IU] via SUBCUTANEOUS
  Administered 2015-08-02: 4 [IU] via SUBCUTANEOUS
  Administered 2015-08-02: 7 [IU] via SUBCUTANEOUS
  Administered 2015-08-03: 11 [IU] via SUBCUTANEOUS
  Administered 2015-08-03 (×2): 4 [IU] via SUBCUTANEOUS

## 2015-07-27 MED ORDER — INSULIN ASPART 100 UNIT/ML ~~LOC~~ SOLN
3.0000 [IU] | SUBCUTANEOUS | Status: DC
  Administered 2015-07-27 – 2015-08-02 (×27): 3 [IU] via SUBCUTANEOUS

## 2015-07-27 MED ORDER — FUROSEMIDE 10 MG/ML IJ SOLN
40.0000 mg | Freq: Every day | INTRAMUSCULAR | Status: DC
  Administered 2015-07-27: 40 mg via INTRAVENOUS
  Filled 2015-07-27: qty 4

## 2015-07-27 MED ORDER — LEVALBUTEROL HCL 0.63 MG/3ML IN NEBU
0.6300 mg | INHALATION_SOLUTION | Freq: Four times a day (QID) | RESPIRATORY_TRACT | Status: DC | PRN
  Administered 2015-07-27: 0.63 mg via RESPIRATORY_TRACT

## 2015-07-27 MED ORDER — ESMOLOL HCL-SODIUM CHLORIDE 2000 MG/100ML IV SOLN
25.0000 ug/kg/min | INTRAVENOUS | Status: DC
  Administered 2015-07-27: 75 ug/kg/min via INTRAVENOUS
  Administered 2015-07-27: 100 ug/kg/min via INTRAVENOUS
  Administered 2015-07-27: 25 ug/kg/min via INTRAVENOUS
  Administered 2015-07-27: 125 ug/kg/min via INTRAVENOUS
  Administered 2015-07-28: 50 ug/kg/min via INTRAVENOUS
  Administered 2015-07-28 (×4): 75 ug/kg/min via INTRAVENOUS
  Administered 2015-07-29 (×2): 100 ug/kg/min via INTRAVENOUS
  Filled 2015-07-27 (×12): qty 100

## 2015-07-27 NOTE — Progress Notes (Signed)
[facility] for Infectious Disease    Date of Admission:  07/17/2015   Total days of antibiotics 11        Day 11 vanco        Day 2  mero        (finished 5 days of tx)   ID: Curtis Frazier is a 75 y.o. male with pneumonia in immunocompromised host Principal Problem:   Sepsis ([place]) Active Problems:   Essential hypertension, benign   Coronary atherosclerosis   Rheumatoid arthritis ([place])   Acute encephalopathy   LLL pneumonia   Type II or unspecified type diabetes mellitus without mention of complication, not stated as uncontrolled   S/P lumbar laminectomy   Elevated troponin   Acute respiratory failure with hypoxia (HCC)   Atrial fibrillation with RVR (HCC)   Acute pulmonary edema (HCC)   HCAP (healthcare-associated pneumonia)   Palliative care encounter   DNR (do not resuscitate) discussion   Acute respiratory failure ([place])   Endotracheally intubated   CHF (congestive heart failure) (HCC)    Subjective: Remains afebrile, more arousable  Interval hx: unable to do LP due to fluctuatingly high INR  Medications:  . acetylcysteine  4 mL Nebulization Q12H  . antiseptic oral rinse  7 mL Mouth Rinse QID  . chlorhexidine gluconate  15 mL Mouth Rinse BID  . feeding supplement (PRO-STAT SUGAR FREE 64)  60 mL Per Tube QID  . feeding supplement (VITAL HIGH PROTEIN)  1,000 mL Per Tube Q24H  . folic acid  1 mg Per Tube Daily  . furosemide  40 mg Intravenous Daily  . hydrocortisone sod succinate (SOLU-CORTEF) inj  50 mg Intravenous Q6H  . insulin aspart  0-20 Units Subcutaneous 6 times per day  . insulin aspart  3 Units Subcutaneous 6 times per day  . insulin NPH Human  15 Units Subcutaneous BID AC & HS  . levalbuterol  0.63 mg Nebulization Q6H  . meropenem (MERREM) IV  1 g Intravenous Q12H  . pantoprazole sodium  40 mg Per Tube Daily  . polyethylene glycol  17 g Oral Daily  . sodium chloride flush  10-40 mL Intracatheter Q12H  . vancomycin  750 mg Intravenous Q24H     Objective: Vital signs in last 24 hours: Temp:  [98.6 F (37 C)-99.5 F (37.5 C)] 99.2 F (37.3 C) (02/14 1157) Pulse Rate:  [44-141] 44 (02/14 1230) Resp:  [10-25] 15 (02/14 1230) BP: (85-129)/(52-87) 102/74 mmHg (02/14 1215) SpO2:  [87 %-97 %] 92 % (02/14 1230) FiO2 (%):  [50 %-70 %] 70 % (02/14 1200) Weight:  [217 lb 9.5 oz (98.7 kg)] 217 lb 9.5 oz (98.7 kg) (02/14 0257) Physical Exam  Constitutional: sedated. He appears flushed. well-developed and well-nourished. No distress.  HENT: intubated   Cardiovascular: irreg, irreg. Tachy. Exam reveals no gallop and no friction rub.  No murmur heard.  Pulmonary/Chest: Effort normal and breath sounds normal. No respiratory distress. He has no wheezes.  Abdominal: Soft. Bowel sounds are normal. He exhibits no distension. There is no tenderness.  Skin: Skin is warm and dry. No rash noted. No erythema.    Lab Results  Recent Labs  07/26/15 0422 07/27/15 0625  WBC 7.7 7.0  HGB 8.7* 8.4*  HCT 28.7* 26.2*  NA 142 143  K 4.4 4.6  CL 107 108  CO2 23 24  BUN 72* 79*  CREATININE 1.51* 1.49*   Microbiology: Legionella negative cx ngtd. resp cx from 2/12 few c.albicans  Studies/Results: Ct Head Wo Contrast  07/26/2015  CLINICAL DATA:  Altered mental status, difficulty breathing EXAM: CT HEAD WITHOUT CONTRAST TECHNIQUE: Contiguous axial images were obtained from the base of the skull through the vertex without intravenous contrast. COMPARISON:  07/17/2015 FINDINGS: Significant diffuse cortical atrophy with low attenuation in the deep white matter. No hydrocephalus. No evidence of infarct or mass. No hemorrhage or extra-axial fluid. Near complete opacification right maxillary sinus unchanged. Mild inflammatory change ethmoid air cells again identified. Bilateral mastoid air cell fluid worse when compared to prior study. IMPRESSION: Stable ethmoid and right maxillary sinusitis. Increased severity of bilateral mastoid air cell fluid  suggesting possibility of mastoiditis. Electronically Signed   By: Skipper Cliche M.D.   On: 07/26/2015 17:46   Ct Chest Wo Contrast  07/26/2015  CLINICAL DATA:  Altered mental status, difficulty breathing, unknown infection source EXAM: CT CHEST WITHOUT CONTRAST TECHNIQUE: Multidetector CT imaging of the chest was performed following the standard protocol without IV contrast. COMPARISON:  07/26/15 FINDINGS: The patient is intubated with endotracheal tube ending above the carina. NG tube crosses into the stomach. Right PICC line extends to the cavoatrial junction. There is extensive coronary artery calcification. There is calcification of the aortic valves and of the thoracic aorta diffusely. There is cardiac enlargement. There is a small to moderate pericardial effusion. There is a moderate left pleural effusion and a small right pleural effusion. There are mildly enlarged mediastinal lymph nodes diffusely measuring up to about 12 mm. There is consolidation in both lower lobes. Consolidation is much more extensive on the left and. Posterior dependent consolidation extends into the left upper lobe. Images through the upper abdomen demonstrate no acute findings. There are no acute musculoskeletal findings. Thoracic spine compression deformities are stable from 07/17/2015. IMPRESSION: Significant increase in pleural effusions and bilateral consolidation when compared to 07/17/2015. Consolidation may represent compressive atelectasis but particularly on the left superimposed pneumonia or pneumonitis not excluded. Small to moderate pericardial effusion increased when compared to 07/17/2015. Electronically Signed   By: Skipper Cliche M.D.   On: 07/26/2015 17:53   Dg Chest Port 1 View  07/26/2015  CLINICAL DATA:  Follow-up pneumonia EXAM: PORTABLE CHEST 1 VIEW COMPARISON:  Portable chest x-ray of May 23, 2016 FINDINGS: The lungs are mildly hypoinflated. There is left lower lobe atelectasis or pneumonia with  small left pleural effusion, stable. Mild infrahilar interstitial prominence on the right also stable. The cardiac silhouette remains enlarged. The central pulmonary vascularity is mildly prominent. The endotracheal tube tip lies 3.9 cm above the carina. The esophagogastric tube tip projects below the inferior margin of the image. The right-sided PICC line tip projects over the midportion of the SVC. IMPRESSION: Persistent left lower lobe atelectasis and small left pleural effusion. Mild central pulmonary vascular congestion with mild interstitial edema not significantly changed. The support tubes are in reasonable position. Electronically Signed   By: David  Martinique M.D.   On: 07/26/2015 07:21     Assessment/Plan: 75yo M with history of RA previously on humara and mtx with low dose pred, CAD, who presents with fever, respiratory distress now intubated with little response to HCAP coverage. Due to being immunocompromised host , potentially at risk for atypical pulmonary infection such as nocardia. UA does not suggest pyuria to be c/w uti, and mri of spine only shows post op changes with incision looking clean at admission  -  Currently on day 11 of broad spectrum abtx for presumed HCAP vs post surgical meningitis, would  continue with vanco and meropenem for addn 4 days to complete 14 day treatment -  EEG is negative ruling out seizure - continue with vent support and weaning trial -  BAL did grow a few c.albicans which is not unusual, often see as colonizer. cxr does not suggest worsening process. If he clinically worsens then would start antifungals. - given improvement in CNS, will defer LP   [employer], [facility] Infectious [facility] Cell: [phone] Pager: [phone]  07/27/2015, 12:35 PM

## 2015-07-27 NOTE — Progress Notes (Signed)
Patient ID: Curtis Frazier, male   DOB: 05/21/1941, 75 y.o.   MRN: [id number] Subjective:  the patient is somnolent but arousable to voice.  He is in no apparent distress.  Objective: Vital signs in last 24 hours: Temp:  [98.6 F (37 C)-99.5 F (37.5 C)] 98.7 F (37.1 C) (02/14 0835) Pulse Rate:  [61-141] 119 (02/14 0830) Resp:  [10-25] 12 (02/14 0830) BP: (75-117)/(54-83) 104/77 mmHg (02/14 0815) SpO2:  [87 %-97 %] 87 % (02/14 0830) FiO2 (%):  [40 %-50 %] 50 % (02/14 0753) Weight:  [98.7 kg (217 lb 9.5 oz)] 98.7 kg (217 lb 9.5 oz) (02/14 0257)  Intake/Output from previous day: 02/13 0701 - 02/14 0700 In: 4502.2 [I.V.:2688.2; Blood:479; NG/GT:735; IV Piggyback:600] Out: 2680 [Urine:2680] Intake/Output this shift: Total I/O In: 80.3 [I.V.:65.3; NG/GT:15] Out: 150 [Urine:150]  Physical exam The patient is somewhat but arousable.  He follows commands.  The patient's dressing is clean and dry.  There is no drainage.  Lab Results:  Recent Labs  07/26/15 0422 07/27/15 0625  WBC 7.7 7.0  HGB 8.7* 8.4*  HCT 28.7* 26.2*  PLT 346 303   BMET  Recent Labs  07/26/15 0422 07/27/15 0625  NA 142 143  K 4.4 4.6  CL 107 108  CO2 23 24  GLUCOSE 247* 242*  BUN 72* 79*  CREATININE 1.51* 1.49*  CALCIUM 7.1* 7.3*    Studies/Results: Ct Head Wo Contrast  07/26/2015  CLINICAL DATA:  Altered mental status, difficulty breathing EXAM: CT HEAD WITHOUT CONTRAST TECHNIQUE: Contiguous axial images were obtained from the base of the skull through the vertex without intravenous contrast. COMPARISON:  07/17/2015 FINDINGS: Significant diffuse cortical atrophy with low attenuation in the deep white matter. No hydrocephalus. No evidence of infarct or mass. No hemorrhage or extra-axial fluid. Near complete opacification right maxillary sinus unchanged. Mild inflammatory change ethmoid air cells again identified. Bilateral mastoid air cell fluid worse when compared to prior study. IMPRESSION: Stable  ethmoid and right maxillary sinusitis. Increased severity of bilateral mastoid air cell fluid suggesting possibility of mastoiditis. Electronically Signed   By: Skipper Cliche M.D.   On: 07/26/2015 17:46   Ct Chest Wo Contrast  07/26/2015  CLINICAL DATA:  Altered mental status, difficulty breathing, unknown infection source EXAM: CT CHEST WITHOUT CONTRAST TECHNIQUE: Multidetector CT imaging of the chest was performed following the standard protocol without IV contrast. COMPARISON:  07/26/15 FINDINGS: The patient is intubated with endotracheal tube ending above the carina. NG tube crosses into the stomach. Right PICC line extends to the cavoatrial junction. There is extensive coronary artery calcification. There is calcification of the aortic valves and of the thoracic aorta diffusely. There is cardiac enlargement. There is a small to moderate pericardial effusion. There is a moderate left pleural effusion and a small right pleural effusion. There are mildly enlarged mediastinal lymph nodes diffusely measuring up to about 12 mm. There is consolidation in both lower lobes. Consolidation is much more extensive on the left and. Posterior dependent consolidation extends into the left upper lobe. Images through the upper abdomen demonstrate no acute findings. There are no acute musculoskeletal findings. Thoracic spine compression deformities are stable from 07/17/2015. IMPRESSION: Significant increase in pleural effusions and bilateral consolidation when compared to 07/17/2015. Consolidation may represent compressive atelectasis but particularly on the left superimposed pneumonia or pneumonitis not excluded. Small to moderate pericardial effusion increased when compared to 07/17/2015. Electronically Signed   By: Skipper Cliche M.D.   On: 07/26/2015 17:53  Dg Chest Port 1 View  07/26/2015  CLINICAL DATA:  Follow-up pneumonia EXAM: PORTABLE CHEST 1 VIEW COMPARISON:  Portable chest x-ray of May 23, 2016  FINDINGS: The lungs are mildly hypoinflated. There is left lower lobe atelectasis or pneumonia with small left pleural effusion, stable. Mild infrahilar interstitial prominence on the right also stable. The cardiac silhouette remains enlarged. The central pulmonary vascularity is mildly prominent. The endotracheal tube tip lies 3.9 cm above the carina. The esophagogastric tube tip projects below the inferior margin of the image. The right-sided PICC line tip projects over the midportion of the SVC. IMPRESSION: Persistent left lower lobe atelectasis and small left pleural effusion. Mild central pulmonary vascular congestion with mild interstitial edema not significantly changed. The support tubes are in reasonable position. Electronically Signed   By: David  Martinique M.D.   On: 07/26/2015 07:21    Assessment/Plan: Status post lumbar discectomy: A lumbar puncture is tentatively planned for today to run out meningitis.  I would recommend doing the lumbar puncture at approximately L2-3 or L3 L4 to avoid the fresh incision.  Respiratory failure, sepsis, etc.: Dr. [name] is managing these issues.  I am going out of town tomorrow and will not be back until Monday.  If there are any neurosurgical issues please contact Dr. Ellene Route.   LOS: 10 days     Jamea Robicheaux D 07/27/2015, 8:47 AM

## 2015-07-27 NOTE — Care Management Note (Signed)
Case Management Note  Patient Details  Name: Curtis Frazier MRN: [id number] Date of Birth: 1941-02-29  Subjective/Objective:   Date: 07/20/15 Spoke with patient's son and daughter on 07/19/15, patient unable to give information, on nrb and not oriented . Introduced self as Tourist information centre manager and explained role in discharge planning and how to be reached.  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 All family members work , they will not be able to give full time supervision for patient or his wife.  NCM gave family resources for Adult Day Care ([facility]) , [facility] and a list of ALF's in [facility] (specifically for wife).   Expressed potential need for no other DME.  Disposition uncertain at that time, family interested in speaking with Palliative care. Patient has Medicare insurance Patient  is driven by family to MD appointments.  Patient has PCP Dr. Loura Pardon.   Plan: CM will continue to follow for discharge planning and [facility] resources.                  Action/Plan: 07/27/2015 Pt remains intubated, lumbar puncture planned for today.  Palliative consulted.  Expected Discharge Date:                  Expected Discharge Plan:  [place]  In-House Referral:     Discharge planning Services  CM Consult  Post Acute Care Choice:    Choice offered to:     DME Arranged:    DME Agency:     HH Arranged:    [place] Agency:     Status of Service:  In process, will continue to follow  Medicare Important Message Given:    Date Medicare IM Given:    Medicare IM give by:    Date Additional Medicare IM Given:    Additional Medicare Important Message give by:     If discussed at [place] of Stay Meetings, dates discussed:  07/27/15  Additional Comments: CM assessed pt with daughter and son in law at bedside.  Per daughter; between the six children pt will have all the support and supervision needed at home but will be interested in  [facility], and they will arrange for care for wife when needed so father can focus on healing at discharge.  Pt was completely independent before admit.  Pt remains intubated at this time and failed wean this am.  CM will continue to monitor for disposition needs Maryclare Labrador, RN 07/27/2015, 11:42 AM

## 2015-07-27 NOTE — Progress Notes (Signed)
Patient ID: Curtis Frazier, male   DOB: 1940-10-13, 75 y.o.   MRN: [id number]   Patient Name: Curtis Frazier Date of Encounter: 07/27/2015   SUBJECTIVE  On vent arousable  Flutter rate still high No LP   CURRENT MEDS . antiseptic oral rinse  7 mL Mouth Rinse QID  . chlorhexidine gluconate  15 mL Mouth Rinse BID  . feeding supplement (PRO-STAT SUGAR FREE 64)  60 mL Per Tube QID  . feeding supplement (VITAL HIGH PROTEIN)  1,000 mL Per Tube Q24H  . folic acid  1 mg Per Tube Daily  . furosemide  40 mg Intravenous Daily  . hydrocortisone sod succinate (SOLU-CORTEF) inj  50 mg Intravenous Q6H  . insulin aspart  0-20 Units Subcutaneous 6 times per day  . insulin NPH Human  15 Units Subcutaneous BID AC & HS  . levalbuterol  0.63 mg Nebulization Q6H  . meropenem (MERREM) IV  1 g Intravenous Q12H  . pantoprazole sodium  40 mg Per Tube Daily  . polyethylene glycol  17 g Oral Daily  . sodium chloride flush  10-40 mL Intracatheter Q12H  . vancomycin  750 mg Intravenous Q24H    OBJECTIVE  Filed Vitals:   07/27/15 0830 07/27/15 0835 07/27/15 0845 07/27/15 0900  BP: 126/87  85/52 111/71  Pulse: 119  120 121  Temp:  98.7 F (37.1 C)    TempSrc:  Oral    Resp: 12  13 22   Height:      Weight:      SpO2: 87%  87% 91%    Intake/Output Summary (Last 24 hours) at 07/27/15 0923 Last data filed at 07/27/15 0900  Gross per 24 hour  Intake 3763.69 ml  Output   2855 ml  Net 908.69 ml   Filed Weights   07/25/15 0500 07/26/15 0359 07/27/15 0257  Weight: 95.9 kg (211 lb 6.7 oz) 96.5 kg (212 lb 11.9 oz) 98.7 kg (217 lb 9.5 oz)    PHYSICAL EXAM  General: Ill appearing. Sedated and ventilated Neuro: sedated Psych: sedated HEENT: ETT in place  Neck: Supple  Lungs:  CTA anteriorly Heart: IR IR with tachycardia Abdomen: Soft, non-distended, BS + x 4.  Extremities: No clubbing, cyanosis. Stocking in place  Accessory Clinical Findings  CBC  Recent Labs  07/26/15 0422 07/27/15 0625  WBC  7.7 7.0  NEUTROABS 5.1  --   HGB 8.7* 8.4*  HCT 28.7* 26.2*  MCV 99.3 100.4*  PLT 346 [id number]   Basic Metabolic Panel  Recent Labs  07/26/15 0422 07/27/15 0625  NA 142 143  K 4.4 4.6  CL 107 108  CO2 23 24  GLUCOSE 247* 242*  BUN 72* 79*  CREATININE 1.51* 1.49*  CALCIUM 7.1* 7.3*  MG  --  2.1  PHOS  --  3.9   Liver Function Tests  Recent Labs  07/26/15 0422 07/27/15 0625  AST 24 96*  ALT 29 77*  ALKPHOS 85 156*  BILITOT 0.3 0.5  PROT 5.7* 5.7*  ALBUMIN 1.9* 1.8*   No results for input(s): LIPASE, AMYLASE in the last 72 hours. Cardiac Enzymes  Recent Labs  07/26/15 0422  TROPONINI 0.16*     Recent Labs  07/27/15 0445  TRIG 123    TELE  afib at rate of 120-130s  Radiology/Studies  Ct Head Wo Contrast  07/26/2015  CLINICAL DATA:  Altered mental status, difficulty breathing EXAM: CT HEAD WITHOUT CONTRAST TECHNIQUE: Contiguous axial images were obtained from the base of  the skull through the vertex without intravenous contrast. COMPARISON:  07/17/2015 FINDINGS: Significant diffuse cortical atrophy with low attenuation in the deep white matter. No hydrocephalus. No evidence of infarct or mass. No hemorrhage or extra-axial fluid. Near complete opacification right maxillary sinus unchanged. Mild inflammatory change ethmoid air cells again identified. Bilateral mastoid air cell fluid worse when compared to prior study. IMPRESSION: Stable ethmoid and right maxillary sinusitis. Increased severity of bilateral mastoid air cell fluid suggesting possibility of mastoiditis. Electronically Signed   By: Skipper Cliche M.D.   On: 07/26/2015 17:46   Ct Head Wo Contrast  07/17/2015  CLINICAL DATA:  pt awoke this morning "not acting right" has been experiencing acute confusion. Pt w/ back surgery x2 days ago, FEVER 102oriented to location, disoriented to situation and time EXAM: CT HEAD WITHOUT CONTRAST TECHNIQUE: Contiguous axial images were obtained from the base of the skull  through the vertex without intravenous contrast. COMPARISON:  None. FINDINGS: Severe diffuse age-related atrophy with moderate low attenuation in the deep white matter. No evidence of vascular territory infarct or mass. No hemorrhage or extra-axial fluid. No hydrocephalus. The calvarium is intact. There is near complete opacification of the right maxillary sinus. Several bilateral ethmoid air cells are opacified or show inflammatory change particularly on the right. Several mastoid air cells on the right are opacified. IMPRESSION: Age-related involutional change. Significant inflammatory change involving the maxillary sinus, with inflammatory change also involving right mastoid air cells and ethmoid air cells. Electronically Signed   By: Skipper Cliche M.D.   On: 07/17/2015 19:55   Ct Chest Wo Contrast  07/26/2015  CLINICAL DATA:  Altered mental status, difficulty breathing, unknown infection source EXAM: CT CHEST WITHOUT CONTRAST TECHNIQUE: Multidetector CT imaging of the chest was performed following the standard protocol without IV contrast. COMPARISON:  07/26/15 FINDINGS: The patient is intubated with endotracheal tube ending above the carina. NG tube crosses into the stomach. Right PICC line extends to the cavoatrial junction. There is extensive coronary artery calcification. There is calcification of the aortic valves and of the thoracic aorta diffusely. There is cardiac enlargement. There is a small to moderate pericardial effusion. There is a moderate left pleural effusion and a small right pleural effusion. There are mildly enlarged mediastinal lymph nodes diffusely measuring up to about 12 mm. There is consolidation in both lower lobes. Consolidation is much more extensive on the left and. Posterior dependent consolidation extends into the left upper lobe. Images through the upper abdomen demonstrate no acute findings. There are no acute musculoskeletal findings. Thoracic spine compression deformities  are stable from 07/17/2015. IMPRESSION: Significant increase in pleural effusions and bilateral consolidation when compared to 07/17/2015. Consolidation may represent compressive atelectasis but particularly on the left superimposed pneumonia or pneumonitis not excluded. Small to moderate pericardial effusion increased when compared to 07/17/2015. Electronically Signed   By: Skipper Cliche M.D.   On: 07/26/2015 17:53   Ct Angio Chest Pe W/cm &/or Wo Cm  07/17/2015  CLINICAL DATA:  Back surgery 3 days ago with severe chest pain and shortness of breath. EXAM: CT ANGIOGRAPHY CHEST WITH CONTRAST TECHNIQUE: Multidetector CT imaging of the chest was performed using the standard protocol during bolus administration of intravenous contrast. Multiplanar CT image reconstructions and MIPs were obtained to evaluate the vascular anatomy. CONTRAST:  168mL OMNIPAQUE IOHEXOL 350 MG/ML SOLN COMPARISON:  07/05/2011 FINDINGS: THORACIC INLET/BODY WALL: Bilateral thyroid nodules measuring up to 11 mm, likely stable from 2013 when allowing streak artifact on previous study. No  acute finding MEDIASTINUM: Mild cardiomegaly. No pericardial effusion. There is mild to moderate aortic valve calcifications/sclerosis without visible progression since 2013. Diffuse atherosclerosis, including the coronary arteries. No evidence of acute aortic syndrome. CTA of the pulmonary arteries is limited by bolus dispersion and respiratory motion at the bases, primarily affecting subsegmental vessels. There is no visible pulmonary embolism. Patulous esophagus. LUNG WINDOWS: Streaky opacities in the lower lungs with volume loss consistent with atelectasis. Intermittent airway mucus/debris, also seen on the previous study. Interlobular septal thickening at the apices compatible with early edema. Probable emphysema, with uncertainty related to respiratory motion. UPPER ABDOMEN: Cholelithiasis. OSSEOUS: T10 to lumbar posterior fixation. There are compression  fractures of the T5, T7, and T8 vertebral bodies without acute/unhealed fracture line visible. Progressive and advanced adjacent segment disc degeneration above the fusion. Review of the MIP images confirms the above findings. IMPRESSION: 1. No evidence of pulmonary embolism. 2. Bibasilar atelectasis. 3. Early pulmonary edema. 4. Study degraded by respiratory motion. Electronically Signed   By: Monte Fantasia M.D.   On: 07/17/2015 21:37   Mr Jeri Cos [id number] Contrast  07/21/2015  CLINICAL DATA:  75 year old male with recent back surgery. Fever, confusion, sepsis. Intubated. Initial encounter. EXAM: MRI HEAD WITHOUT AND WITH CONTRAST TECHNIQUE: Multiplanar, multiecho pulse sequences of the brain and surrounding structures were obtained without and with intravenous contrast. CONTRAST:  74mL MULTIHANCE GADOBENATE DIMEGLUMINE 529 MG/ML IV SOLN COMPARISON:  Head CT without contrast 07/17/2015. FINDINGS: No restricted diffusion to suggest acute infarction. No midline shift, mass effect, evidence of mass lesion, ventriculomegaly, extra-axial collection or acute intracranial hemorrhage. Cervicomedullary junction and pituitary are within normal limits. Negative visualized cervical spine. Major intracranial vascular flow voids are preserved. Sokolski and white matter signal is within normal limits for age throughout the brain. No cortical encephalomalacia or chronic cerebral blood products. No abnormal enhancement identified. No dural thickening. Intubated. Small volume fluid in the pharynx. Right greater than left mastoid effusions. Right greater than left paranasal sinus fluid and opacification. Other Visible internal auditory structures appear normal. Postoperative changes to both globes. Negative orbit and scalp soft tissues. Normal bone marrow signal. IMPRESSION: 1. Negative for age MRI appearance of the brain. 2. Mastoid and paranasal sinus fluid/inflammation in the setting of intubation. Electronically Signed   By: Genevie Ann  M.D.   On: 07/21/2015 16:41   Mr Lumbar Spine W Wo Contrast  07/21/2015  CLINICAL DATA:  Status post lumbar laminectomy 2 days ago. History of rheumatoid arthritis. Fever and sepsis. Question infection. EXAM: MRI LUMBAR SPINE WITHOUT AND WITH CONTRAST TECHNIQUE: Multiplanar and multiecho pulse sequences of the lumbar spine were obtained without and with intravenous contrast. CONTRAST:  20 cc MultiHance IV. COMPARISON:  MRI lumbar spine 06/09/2015. Single intraoperative view of the lumbar spine 07/15/2015. FINDINGS: The patient is status post T10-L5 fusion with pedicle screws and stabilization bars in place. Extensive artifact from hardware. No evidence of discitis or osteomyelitis is identified. Vertebral body height and alignment are maintained. Descending nerve roots appear clumped from L3-4 to mid L5 suggestive of arachnoiditis. Imaged intra-abdominal contents are unremarkable. The T9-10 to T11-12 levels are imaged in the sagittal plane only. The central canal and foramina appear open. The T12-L1 to L2-3 levels are obscured on axial imaging by artifact from hardware. L3-4: Status post fusion. The central canal and foramina appear open. L4-5: Status post laminectomy and fusion. The central canal and foramina appear open. L5-S1: The patient has a new right laminotomy defect. There is a fluid collection  in the surgical bed measuring approximately 3.7 cm AP by 1.1 cm transverse by 2.3 cm craniocaudal. Fluid extends into the posterior subcutaneous tissues. Posterior to the L5 vertebral body, there is a focus most compatible with a disc protrusion measuring 2.4 cm transverse by 1.1 cm AP x 2.1 cm craniocaudal. The central canal appears decompressed and the disc is smaller than on the prior exam. The foramina are open. IMPRESSION: Interval right laminotomy at L5-S1 for discectomy. Findings compatible with a disc protrusion are identified. The protrusion appears smaller than on the prior examination and the central  canal and foramina are decompressed. Fluid collection in the laminectomy bed extending into the subcutaneous soft tissues cannot be definitively characterized but is likely a postoperative seroma given the patient's recent surgery. Status post T10-L5 fusion. The central canal and foramina appear open at these levels although multiple levels are obscured by artifact. 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 Electronically Signed   By: Inge Rise M.D.   On: 07/21/2015 16:50   Dg Chest Port 1 View  07/26/2015  CLINICAL DATA:  Follow-up pneumonia EXAM: PORTABLE CHEST 1 VIEW COMPARISON:  Portable chest x-ray of May 23, 2016 FINDINGS: The lungs are mildly hypoinflated. There is left lower lobe atelectasis or pneumonia with small left pleural effusion, stable. Mild infrahilar interstitial prominence on the right also stable. The cardiac silhouette remains enlarged. The central pulmonary vascularity is mildly prominent. The endotracheal tube tip lies 3.9 cm above the carina. The esophagogastric tube tip projects below the inferior margin of the image. The right-sided PICC line tip projects over the midportion of the SVC. IMPRESSION: Persistent left lower lobe atelectasis and small left pleural effusion. Mild central pulmonary vascular congestion with mild interstitial edema not significantly changed. The support tubes are in reasonable position. Electronically Signed   By: David  Martinique M.D.   On: 07/26/2015 07:21   Dg Chest Port 1 View  07/25/2015  CLINICAL DATA:  75 year old male with sepsis. EXAM: PORTABLE CHEST 1 VIEW COMPARISON:  07/23/2015 and prior studies FINDINGS: The patient is rotated. An endotracheal tube with tip 4 cm above carina, right PICC line with tip overlying the superior cavoatrial junction, NG tube entering the stomach with tip off the field of view again noted. Persistent right lower lung airspace disease/ atelectasis, right perihilar  opacity/atelectasis and left lower lung consolidations/atelectasis noted. Pulmonary vascular congestion has decreased. There may be trace pleural effusions. There is no evidence of pneumothorax. IMPRESSION: Decreased pulmonary vascular congestion without other significant change. Continued atelectasis/ airspace disease/ consolidation as described. Electronically Signed   By: Margarette Canada M.D.   On: 07/25/2015 07:26   Dg Chest Port 1 View  07/23/2015  CLINICAL DATA:  75 year old male with ventilator dependent respiratory failure. Sepsis. Initial encounter. EXAM: PORTABLE CHEST 1 VIEW COMPARISON:  07/22/2015 and earlier. FINDINGS: Portable AP semi upright view at 0405 hours. Stable endotracheal tube position. Enteric tube courses to the abdomen, tip not included. Stable right PICC line. Stable cardiomegaly and mediastinal contours. Interval decreased veiling opacity in the lungs but continued dense retrocardiac opacity at both bases. Partially visible spinal fusion hardware appears stable. No pneumothorax. No overt edema. IMPRESSION: 1.  Stable lines and tubes. 2. Bilateral pleural effusions are less apparent. No overt edema. Continued dense bilateral lower lobe collapse or consolidation. Electronically Signed   By: Genevie Ann M.D.   On: 07/23/2015 06:36   Dg Chest Port 1 View  07/22/2015  CLINICAL DATA:  Ventilator dependent respiratory failure, coronary artery disease, sepsis, acute encephalopathy, left lower lobe pneumonia. EXAM: PORTABLE CHEST 1 VIEW COMPARISON:  Portable chest x-ray of July 21, 2015 FINDINGS: The lungs are reasonably well inflated. There are bilateral pleural effusions layering posteriorly. There is left lower lobe atelectasis or pneumonia. The cardiac silhouette remains enlarged. The pulmonary vascularity remains engorged. The pulmonary interstitial markings are slightly more conspicuous overall today. The endotracheal tube tip lies 4.7 cm above the carina. The esophagogastric tube tip  projects below the inferior margin of the image. The PICC line tip projects over the midportion of the SVC. The patient has undergone lower thoracic lumbar an upper lumbar posterior fusion. IMPRESSION: CHF with mild pulmonary interstitial edema slightly more conspicuous today. Persistent left lower lobe atelectasis or pneumonia. Persistent bilateral pleural effusions layering posteriorly. The support tubes are in reasonable position. Electronically Signed   By: David  Martinique M.D.   On: 07/22/2015 08:33   Portable Chest Xray  07/21/2015  CLINICAL DATA:  Hypoxia EXAM: PORTABLE CHEST 1 VIEW COMPARISON:  July 21, 2015 FINDINGS: Endotracheal tube tip is 4.4 cm above the carina. Nasogastric tube tip and side port are below the diaphragm. Central catheter tip is in the superior vena cava. No pneumothorax. There is persistent pulmonary vascular congestion. There is increase in opacity in the left base region. There is postoperative change in the lower thoracic and visualized upper lumbar region. IMPRESSION: Tube and catheter positions as described without pneumothorax. Increased consolidation left base ; question developing pneumonia or possibly aspiration. Pulmonary vascular congestion with a degree of congestive heart failure remains without change. Electronically Signed   By: Lowella Grip III M.D.   On: 07/21/2015 11:14   Dg Chest Port 1 View  07/21/2015  CLINICAL DATA:  Decreased oxygen saturation EXAM: PORTABLE CHEST 1 VIEW COMPARISON:  July 19, 2015 FINDINGS: Central catheter tip is in the superior vena cava. No pneumothorax. Interstitial edema remains, stable. There is no airspace consolidation. There is cardiomegaly with mild pulmonary venous hypertension. No new opacity. No adenopathy. There is postoperative change in the lower thoracic and visualized upper lumbar region. IMPRESSION: Evidence of a degree of congestive heart failure, stable. No new opacity. No change in cardiac silhouette. Central  catheter tip in superior vena cava. No pneumothorax evident. Electronically Signed   By: Lowella Grip III M.D.   On: 07/21/2015 07:52   Dg Chest Port 1 View  07/19/2015  CLINICAL DATA:  Respiratory distress EXAM: PORTABLE CHEST 1 VIEW COMPARISON:  07/18/2015 FINDINGS: Cardiomegaly with vascular congestion and diffuse interstitial prominence compatible with edema. Right PICC line is in place with the tip at the cavoatrial junction. Possible layering left effusion. No acute bony abnormality. IMPRESSION: Findings compatible with CHF.  Possible small left effusion. Electronically Signed   By: Rolm Baptise M.D.   On: 07/19/2015 13:40   Dg Chest Port 1 View  07/18/2015  CLINICAL DATA:  Line placement EXAM: PORTABLE CHEST 1 VIEW COMPARISON:  07/18/2015 FINDINGS: Right-sided PICC line has been placed, tip overlying the level of the superior vena cava. The heart is enlarged. There is pulmonary vascular congestion. Opacity at the medial left lung base is increased, consistent with developing infiltrate and/or atelectasis. IMPRESSION: 1. Right-sided PICC line tip to the superior vena cava. 2. Developing left lower lobe infiltrate and/or atelectasis. Electronically Signed   By: Nolon Nations M.D.   On: 07/18/2015 14:04   Dg Chest Port 1 View  07/18/2015  CLINICAL DATA:  Shortness of breath.  EXAM: PORTABLE CHEST 1 VIEW COMPARISON:  July 17, 2015. FINDINGS: Stable cardiomegaly. No pneumothorax or pleural effusion is noted. Stable mild bibasilar subsegmental atelectasis is noted. Bony thorax is unremarkable. IMPRESSION: Stable mild bibasilar subsegmental atelectasis. Electronically Signed   By: Marijo Conception, M.D.   On: 07/18/2015 09:06   Dg Chest Port 1 View  07/17/2015  CLINICAL DATA:  Pt w/ back surgery x2 days ago, [facility] report temp of 102.0 on arrival and pt given 1gm APAP, pt also w/ SPO2 of 86% on room air, pt placed on simple mask at 6L/min and SPO2 up to 96%. Pt now under sepsis protocol and is  experiencing acute confusion EXAM: PORTABLE CHEST 1 VIEW COMPARISON:  01/15/2015 FINDINGS: Heart is mildly enlarged. There are no focal consolidations or pleural effusions. There is mild left lower lobe subsegmental atelectasis or scarring. Less likely this could represent early infectious infiltrate. No evidence for pulmonary edema. Previous thoracic spine fusion. IMPRESSION: 1. Cardiomegaly without pulmonary edema. 2. Left lower lobe atelectasis versus early infiltrate. Followup is recommended. Electronically Signed   By: Nolon Nations M.D.   On: 07/17/2015 18:06    ASSESSMENT AND PLAN 75 yo who is s/p recent lumbar surgery on 2/2. Following d/c on 2/4 developed acute confusion and worsening weakness w/ fever > 102. He was brought to the ER. Admitted w/ working dx of sepsis felt due to PNA. Course complicated by shock and atrial fibrillation with RVR.    1. Acute hypoxemic respiratory failure: HCAP in immunocompromised host (RA meds). On broad spectrum antibiotics per ID, CCM. Suspect this is primarily due to PNA rather than CHF/volume overload. Lasix as needed to prevent 3rd spacing Weaning per CCM/RT    2. Atrial fibrillation with RVR: Echo with normal EF, no significant valvular abnormalities. Continue amiodarone Discussed with Dr [name] add Esmolol for better rate control  3. Hypotension: Suspect septic shock. per primary team   4. CAD: known h/o CAD   Jenkins Rouge  5. Elevated troponin likely due to demand ischemia  Signed, Jenkins Rouge PA-C Pager [phone]

## 2015-07-27 NOTE — Progress Notes (Signed)
PULMONARY / CRITICAL CARE MEDICINE   Name: Curtis Frazier MRN: [id number] DOB: 21-Dec-1940    ADMISSION DATE:  07/17/2015 CONSULTATION DATE: 2/8  REFERRING MD:  Hongalgi   CHIEF COMPLAINT:  Acute respiratory failure and sepsis   HISTORY OF PRESENT ILLNESS:   22 yom who is s/p recent lumbar surg on 2/2. Following d/c on 2/4 developed acute confusion and worsening weakness w/ fever > 102. He was brought to the ER. Admitted w/ working dx of sepsis felt d/t PNA +/- UTI. His hospital course was c/b intermittent fever, AF w/ RVR, hypoxia and confusion. He was treated w/ IVFs and empiric abx. On 2/7 apparently had been doing better. On 2/8 developed increased SOB, for which he was placed on NIPPV, this was followed again by fever and then worsening agitation. He had significant hypoxia w/ P/F ratio of  77. PCCM was asked to assess after his agitation escalated to point he would no longer keep his BIPAP on.   SUBJECTIVE:   No fevers Net pos 8.8L  Supra-therapeutic INR discovered yesterday on heparin. Unknown etiology. Imaging with no signs of bleed. Was given FFP(2U) and Vit. K.   VITAL SIGNS: BP 94/69 mmHg  Pulse 108  Temp(Src) 99.5 F (37.5 C) (Oral)  Resp 24  Ht 5\' 8"  (1.727 m)  Wt 217 lb 9.5 oz (98.7 kg)  BMI 33.09 kg/m2  SpO2 95%  HEMODYNAMICS: CVP:  [17 mmHg] 17 mmHg  VENTILATOR SETTINGS: Vent Mode:  [-] PRVC FiO2 (%):  [40 %-50 %] 50 % Set Rate:  [24 bmp] 24 bmp Vt Set:  [550 mL] 550 mL PEEP:  [5 cmH20] 5 cmH20 Plateau Pressure:  [17 cmH20-24 cmH20] 17 cmH20  INTAKE / OUTPUT: I/O last 3 completed shifts: In: 6885.7 [I.V.:4261.7; Blood:479; NG/GT:1545; IV [id number] Out: [id number] [Urine:3790]  PHYSICAL EXAMINATION:  General:  Chronically ill appearing elderly white male, Sedated Neuro: sedated, rass 0, FC well this am  HEENT: ETT, jvd Cardiovascular:  s1 s2 IRT no r Lungs:  reduced bilat with some ronchi, anterior clear Abdomen:  Soft, not tender. + bowel sounds   Musculoskeletal:  Equal st and bulk. Has edema Skin:  Warm, dry, intact.  LABS:  BMET  Recent Labs Lab 07/25/15 2011 07/26/15 0422 07/27/15 0625  NA 145 142 143  K 4.4 4.4 4.6  CL 111 107 108  CO2 25 23 24   BUN 63* 72* 79*  CREATININE 1.45* 1.51* 1.49*  GLUCOSE 244* 247* 242*   Electrolytes  Recent Labs Lab 07/23/15 0448 07/24/15 0430  07/25/15 2011 07/26/15 0422 07/27/15 0625  CALCIUM 7.1* 7.2*  < > 7.1* 7.1* 7.3*  MG 2.4 2.3  --   --   --  2.1  PHOS 4.2 3.5  --   --   --  3.9  < > = values in this interval not displayed.  CBC  Recent Labs Lab 07/25/15 0522 07/26/15 0422 07/27/15 0625  WBC 8.1 7.7 7.0  HGB 7.8* 8.7* 8.4*  HCT 24.6* 28.7* 26.2*  PLT 319 346 303    Coag's  Recent Labs Lab 07/26/15 1421 07/26/15 1624 07/27/15 0445  APTT 25 >200* 26  INR 1.17 2.22* 1.25    Sepsis Markers  Recent Labs Lab 07/21/15 1045 07/23/15 0448 07/24/15 1305 07/25/15 0522  LATICACIDVEN 1.2  --   --   --   PROCALCITON  --  7.58 3.87 3.02    ABG  Recent Labs Lab 07/21/15 0645 07/21/15 1123 07/25/15 1130  PHART 7.476*  7.359 7.274*  PCO2ART 26.6* 36.4 47.2*  PO2ART 77.4* 94.0 69.0*    Liver Enzymes  Recent Labs Lab 07/25/15 0522 07/26/15 0422 07/27/15 0625  AST 27 24 96*  ALT 26 29 77*  ALKPHOS 67 85 156*  BILITOT 0.2* 0.3 0.5  ALBUMIN 1.7* 1.9* 1.8*    Cardiac Enzymes  Recent Labs Lab 07/26/15 0422  TROPONINI 0.16*    Glucose  Recent Labs Lab 07/26/15 0728 07/26/15 1129 07/26/15 1512 07/26/15 2038 07/26/15 2331 07/27/15 0348  GLUCAP 191* 256* 206* 186* 196* 292*    Imaging Ct Head Wo Contrast  07/26/2015  CLINICAL DATA:  Altered mental status, difficulty breathing EXAM: CT HEAD WITHOUT CONTRAST TECHNIQUE: Contiguous axial images were obtained from the base of the skull through the vertex without intravenous contrast. COMPARISON:  07/17/2015 FINDINGS: Significant diffuse cortical atrophy with low attenuation in the  deep white matter. No hydrocephalus. No evidence of infarct or mass. No hemorrhage or extra-axial fluid. Near complete opacification right maxillary sinus unchanged. Mild inflammatory change ethmoid air cells again identified. Bilateral mastoid air cell fluid worse when compared to prior study. IMPRESSION: Stable ethmoid and right maxillary sinusitis. Increased severity of bilateral mastoid air cell fluid suggesting possibility of mastoiditis. Electronically Signed   By: Skipper Cliche M.D.   On: 07/26/2015 17:46   Ct Chest Wo Contrast  07/26/2015  CLINICAL DATA:  Altered mental status, difficulty breathing, unknown infection source EXAM: CT CHEST WITHOUT CONTRAST TECHNIQUE: Multidetector CT imaging of the chest was performed following the standard protocol without IV contrast. COMPARISON:  07/26/15 FINDINGS: The patient is intubated with endotracheal tube ending above the carina. NG tube crosses into the stomach. Right PICC line extends to the cavoatrial junction. There is extensive coronary artery calcification. There is calcification of the aortic valves and of the thoracic aorta diffusely. There is cardiac enlargement. There is a small to moderate pericardial effusion. There is a moderate left pleural effusion and a small right pleural effusion. There are mildly enlarged mediastinal lymph nodes diffusely measuring up to about 12 mm. There is consolidation in both lower lobes. Consolidation is much more extensive on the left and. Posterior dependent consolidation extends into the left upper lobe. Images through the upper abdomen demonstrate no acute findings. There are no acute musculoskeletal findings. Thoracic spine compression deformities are stable from 07/17/2015. IMPRESSION: Significant increase in pleural effusions and bilateral consolidation when compared to 07/17/2015. Consolidation may represent compressive atelectasis but particularly on the left superimposed pneumonia or pneumonitis not excluded.  Small to moderate pericardial effusion increased when compared to 07/17/2015. Electronically Signed   By: Skipper Cliche M.D.   On: 07/26/2015 17:53   STUDIES:  2/4: CT chest images showed no evidence of a pulmonary embolism about basilar atelectasis most likely. ECHO 2/7: EF 65-70%; indeterminate of diastolic fxn, Peak PAP 32 mmHg 2/8:MRI brain & spine>>> brain unremarkable; fluid collection in laminectomy (post-op?) 2/8: EEG >> abnormal; slowing seen consistent with encephalopathy. No epileptic activity  2/13 CT head/chest >> stable sinusitis; mastoiditis. Significant increase in pleural effusions and bilateral Consolidation 2/13 EEG>>severe generalized slowing of cerebral activity continuously which was nonspecific, and can be seen with metabolic and toxic encephalopathies as well as with degenerative nervous system disorders  CULTURES: BCX2 2/8>>> NGTD Respiratory culture 2/8>>> PCP 2/8>>> negative CMV >> RVP>> neg Fungus negative AFB negative  ANTIBIOTICS: vanc 2/4>>> Cefepime 2/5>>> 2/13 Azithromycin 2/9>>2/13 Meropenem 2/13 >>  SIGNIFICANT EVENTS: 2/2: lumbar surgery 2/4: Admitted for PNA 2/8: CCM, intubated, febrile, hypoxemia  2/13- INR elevated x 2 then wnl  LINES/TUBES: Oett 2/8>>> PICC RUE 2/5>>> PIV Foley  DISCUSSION: 50 yom s/p recent lumbar surgery. Admitted 2/4 w/ delirium and working dx of sepsis in setting of PNA. Course c/b AF w/ RVR, intermittent fevers and respiratory failure in spite of ABX. PCCM asked to see on 2/8 for worsening agitation, respiratory failure and distress. Overall not improving since admission.   ASSESSMENT / PLAN:  PULMONARY A: Acute Hypoxic Respiratory Failure - stable but not improving Pulmonary infiltrates. - worsening D-dx include PNA/ evolving ALI vs Pulmonary edema. He is on immunosuppression at baseline so need to consider opportunistic infection.  Unimpressed PNA Did have obstructed ett thick old secretions P:    Maintain vent support Wean cpap 5 PS 5, goal 2 hrs ARDS protocol - dc Ct chest yesterday with worsening of bilateral consolidations and pleural effusions small, no role tap Pos balance allow  CARDIOVASCULAR A:  SIRS/Sepsis- resolved PAF w/ RVR Trop elevation - likely 2/2 demand ischemia  P:  MAP goal >60, off pressors Cardiology following Continue amio drip, bolus Dc heparin see heme Continue neo prn Trop peaked at 2.06; this AM 0.16  RENAL A:   CKD stage 3 w/ acute on chronic renal failure - improving off diuretics hypok - resolved Hypernatremia - resolved P:   Lasix dc Chem daily kvo If pos daily may need further lasix   GASTROINTESTINAL A:   Last BM 10th P:   PPI for SUP Continue TF miralax  HEMATOLOGIC A:   Anemia w/out evidence of blood loss - stable Chronic Thrombocytopenia  hemoconcetrated 2/13 Supra-therapeutic INR - resolved with FFP and Vit K P:  Trend CBC  Heparin drip hold for LP  Repeat PT, aPTT, and INR - wnl this AM Consider a mixing study  lft repeat in am   INFECTIOUS A:   Severe sepsis source unclear.  R/o PNA - ddx- aspiration, hap, opportunistic infection - unimpressed as source Had been treating w/ working dx of PNA and also possible UTI w/ multi-org UC.  Also consider recent lumbar surgery fluid collection No fevers x 4 days HCAP left base  ( vap) 2/13 P:   Per ID -- switched to meropenem (Abx as above) Ensure proper BBB coverage Holding LP, fc now and coags  Can we dc vanc  ENDOCRINE A:   DM w/ hyperglycemia on steriods P:   SSI, NPH BID On pressors, back to stress steroids Add TF coverage  NEUROLOGIC A:   Acute Encephalopathy - improved Recent L spine surgery  P:   RASS goal: 0 PAD protocol [facility] Neurosurgery following Propofol, may need to change to precedex Add fent for dychrony LP holding as FC and coags EEG yesterday with severe generalized slowing  FAMILY  - Updates: to son at bedside and kids daily -  Inter-disciplinary family meet or Palliative Care meeting due by:  2/18   Luiz Blare, DO 07/27/2015, 7:41 AM PGY-2, [facility]  STAFF NOTE: I, Merrie Roof, MD FACP have personally reviewed patient's available data, including medical history, events of note, physical examination and test results as part of my evaluation. I have discussed with resident/NP and other care providers such as pharmacist, RN and RRT. In addition, I personally evaluated patient and elicited key findings of: now FC, coarse BS, ild edema, CT reviewed, neg brain and eeg overall, coags were abnormal x 3, has RA, send mixing study, dc hep IV still, weaning cpa 5 ps 5goal 2 hrs,  no role thora, can we dc vanc?, will hold off LP given coags and now FC, lasix to even balance, upright, treat HCAP LEFt base, add Tf coverage, he is improved today, i updated in full family they were presebnt on rouds in full The patient is critically ill with multiple organ systems failure and requires high complexity decision making for assessment and support, frequent evaluation and titration of therapies, application of advanced monitoring technologies and extensive interpretation of multiple databases.   Critical Care Time devoted to patient care services described in this note is30 Minutes. This time reflects time of care of this signee: Merrie Roof, MD FACP. This critical care time does not reflect procedure time, or teaching time or supervisory time of PA/NP/Med student/Med Resident etc but could involve care discussion time. Rest per NP/medical resident whose note is outlined above and that I agree with   [name]. [name], MD, [place] Pgr: [facility] 07/27/2015 8:58 AM

## 2015-07-27 NOTE — Progress Notes (Signed)
Inpatient Diabetes Program Recommendations  AACE/ADA: New Consensus Statement on Inpatient Glycemic Control (2015)  Target Ranges:  Prepandial:   less than 140 mg/dL      Peak postprandial:   less than 180 mg/dL (1-2 hours)      Critically ill patients:  140 - 180 mg/dL   Review of Glycemic Control  Inpatient Diabetes Program Recommendations:  Insulin - Basal: increase NPH to 20 units BID Thank you  Raoul Pitch BSN, RN,CDE Inpatient Diabetes Coordinator [phone] (team pager)

## 2015-07-27 NOTE — Progress Notes (Signed)
Daily Progress Note   Patient Name: Curtis Frazier       Date: 07/27/2015 DOB: Mar 16, 1941  Age: 75 y.o. MRN#: [id number] Attending Physician: Marshell Garfinkel, MD Primary Care Physician: Loura Pardon, MD Admit Date: 07/17/2015  Reason for Consultation/Follow-up: Establishing goals of care and Psychosocial/spiritual support  Subjective:  - Continued emotional support for family, CCM has updated and verbalized signs of improvement and possibility of successful extubation tomorrow.    -Family are  hopeful for improvement and return to baseline  -questions and concerns addressed  Length of Stay: 10 days  Current Medications: Scheduled Meds:  . acetylcysteine  4 mL Nebulization Q12H  . antiseptic oral rinse  7 mL Mouth Rinse QID  . chlorhexidine gluconate  15 mL Mouth Rinse BID  . feeding supplement (PRO-STAT SUGAR FREE 64)  60 mL Per Tube QID  . feeding supplement (VITAL HIGH PROTEIN)  1,000 mL Per Tube Q24H  . folic acid  1 mg Per Tube Daily  . furosemide  40 mg Intravenous Daily  . hydrocortisone sod succinate (SOLU-CORTEF) inj  50 mg Intravenous Q6H  . insulin aspart  0-20 Units Subcutaneous 6 times per day  . insulin aspart  3 Units Subcutaneous 6 times per day  . insulin NPH Human  15 Units Subcutaneous BID AC & HS  . levalbuterol  0.63 mg Nebulization Q6H  . meropenem (MERREM) IV  1 g Intravenous Q12H  . pantoprazole sodium  40 mg Per Tube Daily  . polyethylene glycol  17 g Oral Daily  . sodium chloride flush  10-40 mL Intracatheter Q12H  . vancomycin  750 mg Intravenous Q24H    Continuous Infusions: . sodium chloride 50 mL/hr at 07/26/15 1900  . amiodarone 30 mg/hr (07/27/15 1004)  . esmolol 50 mcg/kg/min (07/27/15 1300)  . fentaNYL infusion INTRAVENOUS Stopped (07/27/15  0900)  . phenylephrine (NEO-SYNEPHRINE) Adult infusion 10 mcg/min (07/27/15 0900)  . propofol (DIPRIVAN) infusion Stopped (07/27/15 0900)    PRN Meds: bisacodyl, docusate, fentaNYL, fentaNYL (SUBLIMAZE) injection, levalbuterol, midazolam, sodium chloride flush  Physical Exam: Physical Exam  Constitutional: He appears well-developed. He appears ill. He is intubated.  HENT:  Head: Normocephalic.  Cardiovascular: Tachycardia present.   Pulmonary/Chest: He is intubated.  Abdominal: Soft. Normal appearance.  Skin: Skin is warm and dry.  Vital Signs: BP 107/71 mmHg  Pulse 114  Temp(Src) 99.2 F (37.3 C) (Oral)  Resp 15  Ht 5\' 8"  (1.727 m)  Wt 98.7 kg (217 lb 9.5 oz)  BMI 33.09 kg/m2  SpO2 91% SpO2: SpO2: 91 % O2 Device: O2 Device: Ventilator O2 Flow Rate: O2 Flow Rate (L/min): 15 L/min  Intake/output summary:   Intake/Output Summary (Last 24 hours) at 07/27/15 1329 Last data filed at 07/27/15 1300  Gross per 24 hour  Intake 3540.35 ml  Output   3255 ml  Net 285.35 ml   LBM: Last BM Date: 07/23/15 Baseline Weight: Weight: 83.915 kg (185 lb) Most recent weight: Weight: 98.7 kg (217 lb 9.5 oz)       Palliative Assessment/Data: Flowsheet Rows        Most Recent Value   Intake Tab    Referral Department  Hospitalist   Unit at Time of Referral  Intermediate Care Unit   Palliative Care Primary Diagnosis  Cardiac   Date Notified  07/19/15   Palliative Care Type  New Palliative care   Reason for referral  Clarify Goals of Care   Date of Admission  07/17/15   # of days IP prior to Palliative referral  2   Clinical Assessment    Psychosocial & Spiritual Assessment    Palliative Care Outcomes       Additional Data Reviewed: CBC    Component Value Date/Time   WBC 7.0 07/27/2015 0625   RBC 2.61* 07/27/2015 0625   RBC 3.52* 12/19/2013 0819   HGB 8.4* 07/27/2015 0625   HCT 26.2* 07/27/2015 0625   PLT 303 07/27/2015 0625   MCV 100.4* 07/27/2015 0625    MCH 32.2 07/27/2015 0625   MCHC 32.1 07/27/2015 0625   RDW 17.8* 07/27/2015 0625   LYMPHSABS 1.2 07/26/2015 0422   MONOABS 1.1* 07/26/2015 0422   EOSABS 0.3 07/26/2015 0422   BASOSABS 0.0 07/26/2015 0422    CMP     Component Value Date/Time   NA 143 07/27/2015 0625   K 4.6 07/27/2015 0625   CL 108 07/27/2015 0625   CO2 24 07/27/2015 0625   GLUCOSE 242* 07/27/2015 0625   BUN 79* 07/27/2015 0625   CREATININE 1.49* 07/27/2015 0625   CALCIUM 7.3* 07/27/2015 0625   PROT 5.7* 07/27/2015 0625   ALBUMIN 1.8* 07/27/2015 0625   AST 96* 07/27/2015 0625   ALT 77* 07/27/2015 0625   ALKPHOS 156* 07/27/2015 0625   BILITOT 0.5 07/27/2015 0625   GFRNONAA 44* 07/27/2015 0625   GFRAA 51* 07/27/2015 0625       Problem List:  Patient Active Problem List   Diagnosis Date Noted  . Endotracheally intubated   . CHF (congestive heart failure) ([place])   . Palliative care encounter 07/21/2015  . DNR (do not resuscitate) discussion 07/21/2015  . HCAP (healthcare-associated pneumonia)   . Acute respiratory failure ([place])   . Acute respiratory failure with hypoxia ([place]) 07/18/2015  . Atrial fibrillation with RVR ([place]) 07/18/2015  . Acute pulmonary edema ([place]) 07/18/2015  . Sepsis ([place]) 07/17/2015  . Acute encephalopathy 07/17/2015  . LLL pneumonia 07/17/2015  . Type II or unspecified type diabetes mellitus without mention of complication, not stated as uncontrolled 07/17/2015  . S/P lumbar laminectomy 07/17/2015  . Elevated troponin 07/17/2015  . Type 2 diabetes mellitus without complication, without long-term current use of insulin ([place])   . Acute bronchitis 03/05/2015  . Back pain, thoracic 01/15/2015  . Chest wall pain 01/15/2015  . Routine  general medical examination at a health care facility 11/23/2014  . Productive cough 10/14/2014  . Left flank pain 09/25/2014  . Renal insufficiency 11/14/2013  . Encounter for Medicare annual wellness exam 07/07/2013  . BPH (benign prostatic  hyperplasia) 07/05/2012  . Prostate cancer screening 06/27/2012  . Low TSH level 01/22/2012  . PMR (polymyalgia rheumatica) ([facility]) 01/20/2012  . Aortic stenosis 07/08/2011  . New onset a-fib ([place]) 07/05/2011    Class: Acute  . HYPERKALEMIA 02/18/2010  . Essential hypertension, benign 09/13/2009  . ANEMIA-UNSPECIFIED 06/10/2009  . ANGIODYSPLASIA-INTESTINE 06/10/2009  . MURMUR 05/21/2009  . Left carotid bruit 05/21/2009  . Coronary atherosclerosis 03/09/2009  . GERD 03/09/2009  . Rheumatoid arthritis ([place]) 04/29/2008  . HX, PERSONAL, COLONIC POLYPS 01/17/2007  . Diabetes mellitus type 2, controlled, without complications ([place]) [id number]  . Elevated lipids 09/18/2006  . PSORIATIC ARTHRITIS 09/18/2006  . PSORIASIS 09/18/2006  . 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 DEGENERATIVE DISC DISEASE 09/18/2006  . SCOLIOSIS 09/18/2006  . TOBACCO ABUSE, HX OF 09/18/2006     Palliative Care Assessment & Plan    Code Status: Full code- Family is open to all offered and available medical interventions to prolong life.      Code Status Orders        Start     Ordered   07/17/15 2257  Full code   Continuous     07/17/15 2257    Code Status History    Date Active Date Inactive Code Status Order ID Comments User Context   This patient has a current code status but no historical code status.      Care plan was discussed with Dr [name]  Thank you for allowing the Palliative Medicine Team to assist in the care of this patient.  PMT will continue to support holistically   Time In: 1230 Time Out: 1250 Total Time 20 min Prolonged Time Billed  no         Knox Royalty, NP  07/27/2015, 1:29 PM  Please contact Palliative Medicine Team phone at [phone] for questions and concerns.

## 2015-07-27 NOTE — Care Management Important Message (Signed)
Important Message  Patient Details  Name: Curtis Frazier MRN: [id number] Date of Birth: [birth year]   Medicare Important Message Given:  Yes    [name] May 07/27/2015, 5:06 PM

## 2015-07-28 ENCOUNTER — Inpatient Hospital Stay (HOSPITAL_COMMUNITY): Payer: Medicare Other

## 2015-07-28 DIAGNOSIS — R401 Stupor: Secondary | ICD-10-CM

## 2015-07-28 DIAGNOSIS — R4182 Altered mental status, unspecified: Secondary | ICD-10-CM | POA: Insufficient documentation

## 2015-07-28 LAB — COMPREHENSIVE METABOLIC PANEL
ALBUMIN: 2.3 g/dL — AB (ref 3.5–5.0)
ALK PHOS: 135 U/L — AB (ref 38–126)
ALT: 69 U/L — AB (ref 17–63)
ANION GAP: 11 (ref 5–15)
AST: 47 U/L — ABNORMAL HIGH (ref 15–41)
BILIRUBIN TOTAL: 0.6 mg/dL (ref 0.3–1.2)
BUN: 96 mg/dL — AB (ref 6–20)
CALCIUM: 7.8 mg/dL — AB (ref 8.9–10.3)
CO2: 24 mmol/L (ref 22–32)
CREATININE: 1.68 mg/dL — AB (ref 0.61–1.24)
Chloride: 111 mmol/L (ref 101–111)
GFR calc Af Amer: 44 mL/min — ABNORMAL LOW (ref >60)
GFR calc non Af Amer: 38 mL/min — ABNORMAL LOW (ref >60)
GLUCOSE: 153 mg/dL — AB (ref 65–99)
Potassium: 4.4 mmol/L (ref 3.5–5.1)
Sodium: 146 mmol/L — ABNORMAL HIGH (ref 135–145)
TOTAL PROTEIN: 6.6 g/dL (ref 6.5–8.1)

## 2015-07-28 LAB — GLUCOSE, CAPILLARY
GLUCOSE-CAPILLARY: 121 mg/dL — AB (ref 65–99)
GLUCOSE-CAPILLARY: 154 mg/dL — AB (ref 65–99)
GLUCOSE-CAPILLARY: 187 mg/dL — AB (ref 65–99)
Glucose-Capillary: 140 mg/dL — ABNORMAL HIGH (ref 65–99)
Glucose-Capillary: 84 mg/dL (ref 65–99)
Glucose-Capillary: 98 mg/dL (ref 65–99)

## 2015-07-28 LAB — PT FACTOR INHIBITOR (MIXING STUDY)
1 HR INCUB PT 1:1NP: 11.2 s (ref 9.6–11.5)
PT 1:1NP: 11 s (ref 9.6–11.5)
PT: 11.7 s — AB (ref 9.6–11.5)

## 2015-07-28 LAB — BLOOD GAS, ARTERIAL
ACID-BASE EXCESS: 0.6 mmol/L (ref 0.0–2.0)
Bicarbonate: 24.4 mEq/L — ABNORMAL HIGH (ref 20.0–24.0)
Drawn by: 331761
FIO2: 0.55
O2 CONTENT: 14 L/min
O2 Saturation: 88.3 %
PCO2 ART: 36.6 mmHg (ref 35.0–45.0)
PH ART: 7.437 (ref 7.350–7.450)
PO2 ART: 55.2 mmHg — AB (ref 80.0–100.0)
Patient temperature: 97.8
TCO2: 25.6 mmol/L (ref 0–100)

## 2015-07-28 LAB — CBC
HCT: 29.5 % — ABNORMAL LOW (ref 39.0–52.0)
Hemoglobin: 9.4 g/dL — ABNORMAL LOW (ref 13.0–17.0)
MCH: 32 pg (ref 26.0–34.0)
MCHC: 31.9 g/dL (ref 30.0–36.0)
MCV: 100.3 fL — AB (ref 78.0–100.0)
PLATELETS: 311 10*3/uL (ref 150–400)
RBC: 2.94 MIL/uL — ABNORMAL LOW (ref 4.22–5.81)
RDW: 17.8 % — AB (ref 11.5–15.5)
WBC: 10.5 10*3/uL (ref 4.0–10.5)

## 2015-07-28 LAB — PROTIME-INR
INR: 1.19 (ref 0.00–1.49)
Prothrombin Time: 15.3 seconds — ABNORMAL HIGH (ref 11.6–15.2)

## 2015-07-28 LAB — PTT FACTOR INHIBITOR (MIXING STUDY)
APTT 1 1 NORMAL PLASMA: 23.8 s (ref 22.9–30.2)
APTT 11 MIX SALINE: 30.3 s
APTT: 23.7 s (ref 22.9–30.2)

## 2015-07-28 MED ORDER — BISACODYL 10 MG RE SUPP
10.0000 mg | Freq: Once | RECTAL | Status: AC
  Administered 2015-07-28: 10 mg via RECTAL
  Filled 2015-07-28: qty 1

## 2015-07-28 MED ORDER — DIGOXIN 0.25 MG/ML IJ SOLN
0.2500 mg | Freq: Once | INTRAMUSCULAR | Status: AC
  Administered 2015-07-28: 0.25 mg via INTRAVENOUS
  Filled 2015-07-28: qty 1

## 2015-07-28 MED ORDER — DIGOXIN 0.25 MG/ML IJ SOLN
0.5000 mg | Freq: Once | INTRAMUSCULAR | Status: AC
  Administered 2015-07-28: 0.5 mg via INTRAVENOUS

## 2015-07-28 MED ORDER — DIGOXIN 0.25 MG/ML IJ SOLN
0.1250 mg | Freq: Every day | INTRAMUSCULAR | Status: DC
  Administered 2015-07-29 – 2015-08-03 (×6): 0.125 mg via INTRAVENOUS
  Filled 2015-07-28: qty 2
  Filled 2015-07-28 (×2): qty 0.5
  Filled 2015-07-28 (×4): qty 2

## 2015-07-28 MED ORDER — DIGOXIN 0.25 MG/ML IJ SOLN
0.2500 mg | Freq: Once | INTRAMUSCULAR | Status: AC
  Administered 2015-07-28: 0.25 mg via INTRAVENOUS
  Filled 2015-07-28 (×2): qty 1

## 2015-07-28 NOTE — Progress Notes (Signed)
[facility] for [facility]ious Disease    Date of Admission:  07/17/2015   Total days of antibiotics 12        Day 12 vanco        Day 3  mero        (finished 5 days of tx)   ID: Curtis Frazier is a 75 y.o. male with pneumonia vs. 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 Principal Problem:   Sepsis ([place]) Active Problems:   Essential hypertension, benign   Coronary atherosclerosis   Rheumatoid arthritis ([place])   Acute encephalopathy   LLL pneumonia   Type II or unspecified type diabetes mellitus without mention of complication, not stated as uncontrolled   S/P lumbar laminectomy   Elevated troponin   Acute respiratory failure with hypoxia (HCC)   Atrial fibrillation with RVR (HCC)   Acute pulmonary edema (HCC)   HCAP (healthcare-associated pneumonia)   Palliative care encounter   DNR (do not resuscitate) discussion   Acute respiratory failure ([place])   Endotracheally intubated   CHF (congestive heart failure) (HCC)    Subjective: Remains afebrile, more arousable, follows simple command  Interval hx: extubated this morning, wearing venti mask  Medications:  . acetylcysteine  4 mL Nebulization Q12H  . antiseptic oral rinse  7 mL Mouth Rinse QID  . bisacodyl  10 mg Rectal Once  . chlorhexidine gluconate  15 mL Mouth Rinse BID  . [START ON 07/29/2015] digoxin  0.125 mg Intravenous Daily  . digoxin  0.25 mg Intravenous Once  . digoxin  0.25 mg Intravenous Once  . digoxin  0.5 mg Intravenous Once  . feeding supplement (PRO-STAT SUGAR FREE 64)  60 mL Per Tube QID  . feeding supplement (VITAL HIGH PROTEIN)  1,000 mL Per Tube Q24H  . folic acid  1 mg Per Tube Daily  . hydrocortisone sod succinate (SOLU-CORTEF) inj  50 mg Intravenous Q6H  . insulin aspart  0-20 Units Subcutaneous 6 times per day  . insulin aspart  3 Units Subcutaneous 6 times per day  . insulin NPH Human  15 Units Subcutaneous BID AC & HS  . levalbuterol  0.63 mg Nebulization Q6H   . meropenem (MERREM) IV  1 g Intravenous Q12H  . pantoprazole sodium  40 mg Per Tube Daily  . polyethylene glycol  17 g Oral Daily  . sodium chloride flush  10-40 mL Intracatheter Q12H  . vancomycin  750 mg Intravenous Q24H    Objective: Vital signs in last 24 hours: Temp:  [97.8 F (36.6 C)-99.3 F (37.4 C)] 97.8 F (36.6 C) (02/15 0747) Pulse Rate:  [35-129] 110 (02/15 0940) Resp:  [11-24] 16 (02/15 0940) BP: (67-132)/(53-88) 106/72 mmHg (02/15 0940) SpO2:  [86 %-98 %] 92 % (02/15 0940) FiO2 (%):  [50 %-70 %] 55 % (02/15 0940) Weight:  [216 lb 11.4 oz (98.3 kg)] 216 lb 11.4 oz (98.3 kg) (02/15 0500) Physical Exam  Constitutional:  He appears alert. well-developed and well-nourished. No distress.  HENT: wearing ventimask Cardiovascular: irreg, irreg. Tachy. Exam reveals no gallop and no friction rub.  No murmur heard.  Pulmonary/Chest: Effort normal and breath sounds normal. No respiratory distress. He has no wheezes.  Abdominal: Soft. Bowel sounds are normal. He exhibits no distension. There is no tenderness.  Skin: Skin is warm and dry. No rash noted. No erythema.    Lab Results  Recent Labs  07/27/15 0625 07/28/15 0832  WBC 7.0 10.5  HGB 8.4* 9.4*  HCT 26.2* 29.5*  NA 143 146*  K 4.6 4.4  CL 108 111  CO2 24 24  BUN 79* 96*  CREATININE 1.49* 1.68*   Microbiology: Legionella negative cx ngtd. resp cx from 2/12 few c.albicans Studies/Results: Ct Head Wo Contrast  07/26/2015  CLINICAL DATA:  Altered mental status, difficulty breathing EXAM: CT HEAD WITHOUT CONTRAST TECHNIQUE: Contiguous axial images were obtained from the base of the skull through the vertex without intravenous contrast. COMPARISON:  07/17/2015 FINDINGS: Significant diffuse cortical atrophy with low attenuation in the deep white matter. No hydrocephalus. No evidence of infarct or mass. No hemorrhage or extra-axial fluid. Near complete opacification right maxillary sinus unchanged. Mild  inflammatory change ethmoid air cells again identified. Bilateral mastoid air cell fluid worse when compared to prior study. IMPRESSION: Stable ethmoid and right maxillary sinusitis. Increased severity of bilateral mastoid air cell fluid suggesting possibility of mastoiditis. Electronically Signed   By: Skipper Cliche M.D.   On: 07/26/2015 17:46   Ct Chest Wo Contrast  07/26/2015  CLINICAL DATA:  Altered mental status, difficulty breathing, unknown infection source EXAM: CT CHEST WITHOUT CONTRAST TECHNIQUE: Multidetector CT imaging of the chest was performed following the standard protocol without IV contrast. COMPARISON:  07/26/15 FINDINGS: The patient is intubated with endotracheal tube ending above the carina. NG tube crosses into the stomach. Right PICC line extends to the cavoatrial junction. There is extensive coronary artery calcification. There is calcification of the aortic valves and of the thoracic aorta diffusely. There is cardiac enlargement. There is a small to moderate pericardial effusion. There is a moderate left pleural effusion and a small right pleural effusion. There are mildly enlarged mediastinal lymph nodes diffusely measuring up to about 12 mm. There is consolidation in both lower lobes. Consolidation is much more extensive on the left and. Posterior dependent consolidation extends into the left upper lobe. Images through the upper abdomen demonstrate no acute findings. There are no acute musculoskeletal findings. Thoracic spine compression deformities are stable from 07/17/2015. IMPRESSION: Significant increase in pleural effusions and bilateral consolidation when compared to 07/17/2015. Consolidation may represent compressive atelectasis but particularly on the left superimposed pneumonia or pneumonitis not excluded. Small to moderate pericardial effusion increased when compared to 07/17/2015. Electronically Signed   By: Skipper Cliche M.D.   On: 07/26/2015 17:53   Dg Chest Port 1  View  07/28/2015  CLINICAL DATA:  Ventilator dependent EXAM: PORTABLE CHEST 1 VIEW COMPARISON:  07/26/2015 FINDINGS: Cardiac shadow remains enlarged. An endotracheal tube, nasogastric catheter and right-sided PICC line are again identified and stable. Bilateral pleural effusions and vascular congestion with pulmonary edema are seen. The overall appearance is similar to that noted on the prior exam IMPRESSION: Changes of CHF with bilateral pleural effusions and parenchymal edema. Electronically Signed   By: Inez Catalina M.D.   On: 07/28/2015 07:27   Per my read, vascular congestion with bilateral pleural effusion  Assessment/Plan: 75yo M with history of RA previously on humira and mtx with low dose pred, CAD, recently underwent lumbar surgery who presents with fever, respiratory distress now intubated with little response to HCAP coverage. Due to being immunocompromised host , potentially at risk for atypical pulmonary infection such as nocardia. UA does not suggest pyuria to be c/w uti, and mri of spine only shows post op changes with incision looking clean at admission  -  Currently on day 12 of broad spectrum abtx for presumed HCAP vs post surgical meningitis, would continue with vanco and meropenem for  addn 3 days to complete 14 day treatment -  BAL did grow a few c.albicans which is not unusual, often see as colonizer. cxr does not suggest worsening process. If he clinically worsens then would start antifungals. - given improvement in CNS, will defer LP.  Pulmonary edema = consider further diuretics    Jamier Urbas, [facility] Infectious Diseases Cell: [phone] Pager: [phone]  07/28/2015, 10:43 AM

## 2015-07-28 NOTE — Progress Notes (Signed)
Nutrition Follow-up  DOCUMENTATION CODES:   Obesity unspecified  INTERVENTION:    Diet advancement as respiratory status allows.  NUTRITION DIAGNOSIS:   Inadequate oral intake related to inability to eat as evidenced by NPO status.  Ongoing  GOAL:   Patient will meet greater than or equal to 90% of their needs  Unmet  MONITOR:   Diet advancement, PO intake, Labs, Weight trends  ASSESSMENT:   6 yom who is s/p recent lumbar surg on 2/2. Following d/c on 2/4 developed acute confusion and worsening weakness w/ fever > 102. He was brought to the ER. Admitted w/ working dx of sepsis felt d/t PNA +/- UTI. His hospital course was c/b intermittent fever, AF w/ RVR, hypoxia and confusion. Required transfer to ICU and intubation on 2/8.  Discussed patient in ICU rounds and with RN today. Patient was extubated this morning. Now requiring BiPAP. TF off since extubation.   Diet Order:  Diet NPO time specified  Skin:  Reviewed, no issues  Last BM:  2/10  Height:   Ht Readings from Last 1 Encounters:  07/21/15 5\' 8"  (1.727 m)    Weight:   Wt Readings from Last 1 Encounters:  07/28/15 216 lb 11.4 oz (98.3 kg)    Ideal Body Weight:  70 kg  BMI:  Body mass index is 32.96 kg/(m^2).  Estimated Nutritional Needs:   Kcal:  1800-2000  Protein:  100-110 gm  Fluid:  1.8-2 L  EDUCATION NEEDS:   No education needs identified at this time  Molli Barrows, [place], [place], [place] Pager [phone] After Hours Pager [phone]

## 2015-07-28 NOTE — Progress Notes (Signed)
PULMONARY / CRITICAL CARE MEDICINE   Name: Curtis Frazier MRN: [id number] DOB: 1940-08-12    ADMISSION DATE:  07/17/2015 CONSULTATION DATE: 2/8  REFERRING MD:  Hongalgi   CHIEF COMPLAINT:  Acute respiratory failure and sepsis   HISTORY OF PRESENT ILLNESS:   64 yom who is s/p recent lumbar surg on 2/2. Following d/c on 2/4 developed acute confusion and worsening weakness w/ fever > 102. He was brought to the ER. Admitted w/ working dx of sepsis felt d/t PNA +/- UTI. His hospital course was c/b intermittent fever, AF w/ RVR, hypoxia and confusion. He was treated w/ IVFs and empiric abx. On 2/7 apparently had been doing better. On 2/8 developed increased SOB, for which he was placed on NIPPV, this was followed again by fever and then worsening agitation. He had significant hypoxia w/ P/F ratio of  77. PCCM was asked to assess after his agitation escalated to point he would no longer keep his BIPAP on.   SUBJECTIVE:  No fevers Neg balance Hypotension resolved Rate controlled with esmolol and amio  VITAL SIGNS: BP 82/60 mmHg  Pulse 91  Temp(Src) 97.8 F (36.6 C) (Oral)  Resp 24  Ht 5\' 8"  (1.727 m)  Wt 216 lb 11.4 oz (98.3 kg)  BMI 32.96 kg/m2  SpO2 90%  HEMODYNAMICS: CVP:  [12 mmHg] 12 mmHg  VENTILATOR SETTINGS: Vent Mode:  [-] PRVC FiO2 (%):  [50 %-70 %] 50 % Set Rate:  [24 bmp] 24 bmp Vt Set:  [550 mL] 550 mL PEEP:  [5 cmH20-8 cmH20] 8 cmH20 Pressure Support:  [5 cmH20] 5 cmH20 Plateau Pressure:  [18 cmH20-20 cmH20] 20 cmH20  INTAKE / OUTPUT: I/O last 3 completed shifts: In: 4375.1 [I.V.:2760.1; NG/GT:1015; IV [id number] Out: [id number] [Urine:4795]  PHYSICAL EXAMINATION: General:  Chronically ill appearing elderly white male, Sedated Neuro: sedated, rass 0, FC well this am  HEENT: ETT, jvd Cardiovascular:  s1 s2 IRT no r Lungs:  reduced bilat with some ronchi, anterior clear Abdomen:  Soft, not tender. + bowel sounds  Musculoskeletal:  Equal st and bulk. Has  edema Skin:  Warm, dry, intact.  LABS:  BMET  Recent Labs Lab 07/25/15 2011 07/26/15 0422 07/27/15 0625  NA 145 142 143  K 4.4 4.4 4.6  CL 111 107 108  CO2 25 23 24   BUN 63* 72* 79*  CREATININE 1.45* 1.51* 1.49*  GLUCOSE 244* 247* 242*   Electrolytes  Recent Labs Lab 07/23/15 0448 07/24/15 0430  07/25/15 2011 07/26/15 0422 07/27/15 0625  CALCIUM 7.1* 7.2*  < > 7.1* 7.1* 7.3*  MG 2.4 2.3  --   --   --  2.1  PHOS 4.2 3.5  --   --   --  3.9  < > = values in this interval not displayed.  CBC  Recent Labs Lab 07/25/15 0522 07/26/15 0422 07/27/15 0625  WBC 8.1 7.7 7.0  HGB 7.8* 8.7* 8.4*  HCT 24.6* 28.7* 26.2*  PLT 319 346 303    Coag's  Recent Labs Lab 07/26/15 1421 07/26/15 1624 07/27/15 0445  APTT 25 >200* 26  INR 1.17 2.22* 1.25    Sepsis Markers  Recent Labs Lab 07/21/15 1045 07/23/15 0448 07/24/15 1305 07/25/15 0522  LATICACIDVEN 1.2  --   --   --   PROCALCITON  --  7.58 3.87 3.02    ABG  Recent Labs Lab 07/21/15 1123 07/25/15 1130  PHART 7.359 7.274*  PCO2ART 36.4 47.2*  PO2ART 94.0 69.0*  Liver Enzymes  Recent Labs Lab 07/25/15 0522 07/26/15 0422 07/27/15 0625  AST 27 24 96*  ALT 26 29 77*  ALKPHOS 67 85 156*  BILITOT 0.2* 0.3 0.5  ALBUMIN 1.7* 1.9* 1.8*    Cardiac Enzymes  Recent Labs Lab 07/26/15 0422  TROPONINI 0.16*    Glucose  Recent Labs Lab 07/27/15 0731 07/27/15 1104 07/27/15 1520 07/27/15 1956 07/27/15 2347 07/28/15 0407  GLUCAP 219* 162* 152* 131* 187* 154*    Imaging Dg Chest Port 1 View  07/28/2015  CLINICAL DATA:  Ventilator dependent EXAM: PORTABLE CHEST 1 VIEW COMPARISON:  07/26/2015 FINDINGS: Cardiac shadow remains enlarged. An endotracheal tube, nasogastric catheter and right-sided PICC line are again identified and stable. Bilateral pleural effusions and vascular congestion with pulmonary edema are seen. The overall appearance is similar to that noted on the prior exam  IMPRESSION: Changes of CHF with bilateral pleural effusions and parenchymal edema. Electronically Signed   By: Inez Catalina M.D.   On: 07/28/2015 07:27   STUDIES:  2/4: CT chest images showed no evidence of a pulmonary embolism about basilar atelectasis most likely. ECHO 2/7: EF 65-70%; indeterminate of diastolic fxn, Peak PAP 32 mmHg 2/8:MRI brain & spine>>> brain unremarkable; fluid collection in laminectomy (post-op?) 2/8: EEG >> abnormal; slowing seen consistent with encephalopathy. No epileptic activity  2/13 CT head/chest >> stable sinusitis; mastoiditis. Significant increase in pleural effusions and bilateral Consolidation 2/13 EEG>>severe generalized slowing of cerebral activity continuously which was nonspecific, and can be seen with metabolic and toxic encephalopathies as well as with degenerative nervous system disorders  CULTURES: BCX2 2/8>>> NGTD Respiratory culture 2/8>>> PCP 2/8>>> negative CMV >> RVP>> neg Fungus negative AFB negative  ANTIBIOTICS: vanc 2/4>>> for total of 14 days Cefepime 2/5>>> 2/13 Azithromycin 2/9>>2/13 Meropenem 2/13 >>  SIGNIFICANT EVENTS: 2/2: lumbar surgery 2/4: Admitted for PNA 2/8: CCM, intubated, febrile, hypoxemia  2/13- INR elevated x 2 then wnl  LINES/TUBES: Oett 2/8>>> PICC RUE 2/5>>> PIV Foley  DISCUSSION: 37 yom s/p recent lumbar surgery. Admitted 2/4 w/ delirium and working dx of sepsis in setting of PNA. Course c/b AF w/ RVR, intermittent fevers and respiratory failure in spite of ABX. PCCM asked to see on 2/8 for worsening agitation, respiratory failure and distress. Overall not improving since admission.   ASSESSMENT / PLAN:  PULMONARY A: Acute Hypoxic Respiratory Failure - stable but not improving Pulmonary infiltrates. - worsening D-dx include PNA/ evolving ALI vs Pulmonary edema. He is on immunosuppression at baseline so need to consider opportunistic infection.  Unimpressed PNA Did have obstructed ett thick old  secretions P:   Maintain vent support Wean cpap 5 PS 5, goal 2 hrs with abg, rsbi Even balance gosals Upright, chair  CARDIOVASCULAR A:  SIRS/Sepsis- resolved PAF w/ RVR- improved Trop elevation - likely 2/2 demand ischemia  P:  MAP goal >60, off pressors Cardiology following Continue amio drip and esmolol  consider dig load  Dc heparin see heme Continue neo prn Trop peaked at 2.06; this AM 0.16  RENAL A:   CKD stage 3 w/ acute on chronic renal failure - improving off diuretics hypok - resolved Hypernatremia - resolved P:   Lasix, but await chem this am  Chem daily, awaited kvo  GASTROINTESTINAL A:   Last BM 10th P:   PPI for SUP dulx supp  HEMATOLOGIC A:   Anemia w/out evidence of blood loss - stable Chronic Thrombocytopenia  hemoconcetrated 2/13 Supra-therapeutic INR - resolved with FFP and Vit K P:  Trend CBC  scd mixing study - pending lft repeat wnl  INFECTIOUS A:   Severe sepsis source unclear.  R/o PNA - ddx- aspiration, hap, opportunistic infection - unimpressed as source Had been treating w/ working dx of PNA and also possible UTI w/ multi-org UC.  Also consider recent lumbar surgery fluid collection No fevers x 4 days HCAP left base  ( vap) 2/13 P:   Per ID -14 days coarse  ENDOCRINE A:   DM w/ hyperglycemia on steriods - improved with TF coverage P:   SSI, NPH BID On pressors, back to stress steroids TF coverage  NEUROLOGIC A:   Acute Encephalopathy - improved Recent L spine surgery  P:   RASS goal: 0 PAD protocol WUA Propofol, wua Add fent for dychrony LP holding as FC and coags EEG yesterday with severe generalized slowing  FAMILY  - Updates: to son at bedside and kids daily - Inter-disciplinary family meet or Palliative Care meeting due by:  2/18   Luiz Blare, DO 07/28/2015, 7:49 AM PGY-2, [facility]  STAFF NOTE: I, Merrie Roof, MD FACP have personally reviewed patient's available data,  including medical history, events of note, physical examination and test results as part of my evaluation. I have discussed with resident/NP and other care providers such as pharmacist, RN and RRT. In addition, I personally evaluated patient and elicited key findings of: awake, follows commands, some secretions noted, no collapse on pcxr, wean aggressive cpap 5 ps5, goal 30 min , assess abg, rsbi, consider extubation, in chair, await am labs to decide on lasix further, scd for now, await mixing study, abx x 14 days, appreicate ID help, add dig load to dc esmolol , continued amio, i updated daughter The patient is critically ill with multiple organ systems failure and requires high complexity decision making for assessment and support, frequent evaluation and titration of therapies, application of advanced monitoring technologies and extensive interpretation of multiple databases.   Critical Care Time devoted to patient care services described in this note is30 Minutes. This time reflects time of care of this signee: Merrie Roof, MD FACP. This critical care time does not reflect procedure time, or teaching time or supervisory time of PA/NP/Med student/Med Resident etc but could involve care discussion time. Rest per NP/medical resident whose note is outlined above and that I agree with   [name]. [name], MD, [place] Pgr: [facility] 07/28/2015 8:39 AM

## 2015-07-28 NOTE — Progress Notes (Signed)
Patient ID: DEVAL MCFARREN, male   DOB: [birth year], 75 y.o.   MRN: [id number]   Patient Name: Curtis Frazier Date of Encounter: 07/28/2015   SUBJECTIVE  Extubated lethargic poor inspitory efforts   CURRENT MEDS . acetylcysteine  4 mL Nebulization Q12H  . antiseptic oral rinse  7 mL Mouth Rinse QID  . bisacodyl  10 mg Rectal Once  . chlorhexidine gluconate  15 mL Mouth Rinse BID  . [START ON 07/29/2015] digoxin  0.125 mg Intravenous Daily  . digoxin  0.25 mg Intravenous Once  . digoxin  0.25 mg Intravenous Once  . feeding supplement (PRO-STAT SUGAR FREE 64)  60 mL Per Tube QID  . feeding supplement (VITAL HIGH PROTEIN)  1,000 mL Per Tube Q24H  . folic acid  1 mg Per Tube Daily  . hydrocortisone sod succinate (SOLU-CORTEF) inj  50 mg Intravenous Q6H  . insulin aspart  0-20 Units Subcutaneous 6 times per day  . insulin aspart  3 Units Subcutaneous 6 times per day  . insulin NPH Human  15 Units Subcutaneous BID AC & HS  . levalbuterol  0.63 mg Nebulization Q6H  . meropenem (MERREM) IV  1 g Intravenous Q12H  . pantoprazole sodium  40 mg Per Tube Daily  . polyethylene glycol  17 g Oral Daily  . sodium chloride flush  10-40 mL Intracatheter Q12H  . vancomycin  750 mg Intravenous Q24H    OBJECTIVE  Filed Vitals:   07/28/15 1115 07/28/15 1129 07/28/15 1130 07/28/15 1145  BP: 104/71  98/82 112/75  Pulse: 115  105 103  Temp:  98.4 F (36.9 C)    TempSrc:  Oral    Resp: 17  12 12   Height:      Weight:      SpO2: 89%  97% 68%    Intake/Output Summary (Last 24 hours) at 07/28/15 1152 Last data filed at 07/28/15 1100  Gross per 24 hour  Intake 2482.04 ml  Output   2640 ml  Net -157.96 ml   Filed Weights   07/26/15 0359 07/27/15 0257 07/28/15 0500  Weight: 96.5 kg (212 lb 11.9 oz) 98.7 kg (217 lb 9.5 oz) 98.3 kg (216 lb 11.4 oz)    PHYSICAL EXAM  General: Ill appearing. Sedated and ventilated Neuro: sedated Psych: sedated HEENT: ETT in place  Neck: Supple  Lungs:  CTA  anteriorly Heart: IR IR with tachycardia Abdomen: Soft, non-distended, BS + x 4.  Extremities: No clubbing, cyanosis. Stocking in place  Accessory Clinical Findings  CBC  Recent Labs  07/26/15 0422 07/27/15 0625 07/28/15 0832  WBC 7.7 7.0 10.5  NEUTROABS 5.1  --   --   HGB 8.7* 8.4* 9.4*  HCT 28.7* 26.2* 29.5*  MCV 99.3 100.4* 100.3*  PLT 346 303 [id number]   Basic Metabolic Panel  Recent Labs  07/27/15 0625 07/28/15 0832  NA 143 146*  K 4.6 4.4  CL 108 111  CO2 24 24  GLUCOSE 242* 153*  BUN 79* 96*  CREATININE 1.49* 1.68*  CALCIUM 7.3* 7.8*  MG 2.1  --   PHOS 3.9  --    Liver Function Tests  Recent Labs  07/27/15 0625 07/28/15 0832  AST 96* 47*  ALT 77* 69*  ALKPHOS 156* 135*  BILITOT 0.5 0.6  PROT 5.7* 6.6  ALBUMIN 1.8* 2.3*   Cardiac Enzymes  Recent Labs  07/26/15 0422  TROPONINI 0.16*     Recent Labs  07/27/15 0445  TRIG 123  TELE  afib at rate of 120-130s  Radiology/Studies  Ct Head Wo Contrast  07/26/2015  CLINICAL DATA:  Altered mental status, difficulty breathing EXAM: CT HEAD WITHOUT CONTRAST TECHNIQUE: Contiguous axial images were obtained from the base of the skull through the vertex without intravenous contrast. COMPARISON:  07/17/2015 FINDINGS: Significant diffuse cortical atrophy with low attenuation in the deep white matter. No hydrocephalus. No evidence of infarct or mass. No hemorrhage or extra-axial fluid. Near complete opacification right maxillary sinus unchanged. Mild inflammatory change ethmoid air cells again identified. Bilateral mastoid air cell fluid worse when compared to prior study. IMPRESSION: Stable ethmoid and right maxillary sinusitis. Increased severity of bilateral mastoid air cell fluid suggesting possibility of mastoiditis. Electronically Signed   By: Skipper Cliche M.D.   On: 07/26/2015 17:46   Ct Head Wo Contrast  07/17/2015  CLINICAL DATA:  pt awoke this morning "not acting right" has been experiencing  acute confusion. Pt w/ back surgery x2 days ago, FEVER 102oriented to location, disoriented to situation and time EXAM: CT HEAD WITHOUT CONTRAST TECHNIQUE: Contiguous axial images were obtained from the base of the skull through the vertex without intravenous contrast. COMPARISON:  None. FINDINGS: Severe diffuse age-related atrophy with moderate low attenuation in the deep white matter. No evidence of vascular territory infarct or mass. No hemorrhage or extra-axial fluid. No hydrocephalus. The calvarium is intact. There is near complete opacification of the right maxillary sinus. Several bilateral ethmoid air cells are opacified or show inflammatory change particularly on the right. Several mastoid air cells on the right are opacified. IMPRESSION: Age-related involutional change. Significant inflammatory change involving the maxillary sinus, with inflammatory change also involving right mastoid air cells and ethmoid air cells. Electronically Signed   By: Skipper Cliche M.D.   On: 07/17/2015 19:55   Ct Chest Wo Contrast  07/26/2015  CLINICAL DATA:  Altered mental status, difficulty breathing, unknown infection source EXAM: CT CHEST WITHOUT CONTRAST TECHNIQUE: Multidetector CT imaging of the chest was performed following the standard protocol without IV contrast. COMPARISON:  07/26/15 FINDINGS: The patient is intubated with endotracheal tube ending above the carina. NG tube crosses into the stomach. Right PICC line extends to the cavoatrial junction. There is extensive coronary artery calcification. There is calcification of the aortic valves and of the thoracic aorta diffusely. There is cardiac enlargement. There is a small to moderate pericardial effusion. There is a moderate left pleural effusion and a small right pleural effusion. There are mildly enlarged mediastinal lymph nodes diffusely measuring up to about 12 mm. There is consolidation in both lower lobes. Consolidation is much more extensive on the left  and. Posterior dependent consolidation extends into the left upper lobe. Images through the upper abdomen demonstrate no acute findings. There are no acute musculoskeletal findings. Thoracic spine compression deformities are stable from 07/17/2015. IMPRESSION: Significant increase in pleural effusions and bilateral consolidation when compared to 07/17/2015. Consolidation may represent compressive atelectasis but particularly on the left superimposed pneumonia or pneumonitis not excluded. Small to moderate pericardial effusion increased when compared to 07/17/2015. Electronically Signed   By: Skipper Cliche M.D.   On: 07/26/2015 17:53   Ct Angio Chest Pe W/cm &/or Wo Cm  07/17/2015  CLINICAL DATA:  Back surgery 3 days ago with severe chest pain and shortness of breath. EXAM: CT ANGIOGRAPHY CHEST WITH CONTRAST TECHNIQUE: Multidetector CT imaging of the chest was performed using the standard protocol during bolus administration of intravenous contrast. Multiplanar CT image reconstructions and MIPs were obtained  to evaluate the vascular anatomy. CONTRAST:  139mL OMNIPAQUE IOHEXOL 350 MG/ML SOLN COMPARISON:  07/05/2011 FINDINGS: THORACIC INLET/BODY WALL: Bilateral thyroid nodules measuring up to 11 mm, likely stable from 2013 when allowing streak artifact on previous study. No acute finding MEDIASTINUM: Mild cardiomegaly. No pericardial effusion. There is mild to moderate aortic valve calcifications/sclerosis without visible progression since 2013. Diffuse atherosclerosis, including the coronary arteries. No evidence of acute aortic syndrome. CTA of the pulmonary arteries is limited by bolus dispersion and respiratory motion at the bases, primarily affecting subsegmental vessels. There is no visible pulmonary embolism. Patulous esophagus. LUNG WINDOWS: Streaky opacities in the lower lungs with volume loss consistent with atelectasis. Intermittent airway mucus/debris, also seen on the previous study. Interlobular  septal thickening at the apices compatible with early edema. Probable emphysema, with uncertainty related to respiratory motion. UPPER ABDOMEN: Cholelithiasis. OSSEOUS: T10 to lumbar posterior fixation. There are compression fractures of the T5, T7, and T8 vertebral bodies without acute/unhealed fracture line visible. Progressive and advanced adjacent segment disc degeneration above the fusion. Review of the MIP images confirms the above findings. IMPRESSION: 1. No evidence of pulmonary embolism. 2. Bibasilar atelectasis. 3. Early pulmonary edema. 4. Study degraded by respiratory motion. Electronically Signed   By: Monte Fantasia M.D.   On: 07/17/2015 21:37   Mr Jeri Cos [id number] Contrast  07/21/2015  CLINICAL DATA:  75 year old male with recent back surgery. Fever, confusion, sepsis. Intubated. Initial encounter. EXAM: MRI HEAD WITHOUT AND WITH CONTRAST TECHNIQUE: Multiplanar, multiecho pulse sequences of the brain and surrounding structures were obtained without and with intravenous contrast. CONTRAST:  53mL MULTIHANCE GADOBENATE DIMEGLUMINE 529 MG/ML IV SOLN COMPARISON:  Head CT without contrast 07/17/2015. FINDINGS: No restricted diffusion to suggest acute infarction. No midline shift, mass effect, evidence of mass lesion, ventriculomegaly, extra-axial collection or acute intracranial hemorrhage. Cervicomedullary junction and pituitary are within normal limits. Negative visualized cervical spine. Major intracranial vascular flow voids are preserved. Panno and white matter signal is within normal limits for age throughout the brain. No cortical encephalomalacia or chronic cerebral blood products. No abnormal enhancement identified. No dural thickening. Intubated. Small volume fluid in the pharynx. Right greater than left mastoid effusions. Right greater than left paranasal sinus fluid and opacification. Other Visible internal auditory structures appear normal. Postoperative changes to both globes. Negative orbit and  scalp soft tissues. Normal bone marrow signal. IMPRESSION: 1. Negative for age MRI appearance of the brain. 2. Mastoid and paranasal sinus fluid/inflammation in the setting of intubation. Electronically Signed   By: Genevie Ann M.D.   On: 07/21/2015 16:41   Mr Lumbar Spine W Wo Contrast  07/21/2015  CLINICAL DATA:  Status post lumbar laminectomy 2 days ago. History of rheumatoid arthritis. Fever and sepsis. Question infection. EXAM: MRI LUMBAR SPINE WITHOUT AND WITH CONTRAST TECHNIQUE: Multiplanar and multiecho pulse sequences of the lumbar spine were obtained without and with intravenous contrast. CONTRAST:  20 cc MultiHance IV. COMPARISON:  MRI lumbar spine 06/09/2015. Single intraoperative view of the lumbar spine 07/15/2015. FINDINGS: The patient is status post T10-L5 fusion with pedicle screws and stabilization bars in place. Extensive artifact from hardware. No evidence of discitis or osteomyelitis is identified. Vertebral body height and alignment are maintained. Descending nerve roots appear clumped from L3-4 to mid L5 suggestive of arachnoiditis. Imaged intra-abdominal contents are unremarkable. The T9-10 to T11-12 levels are imaged in the sagittal plane only. The central canal and foramina appear open. The T12-L1 to L2-3 levels are obscured on axial imaging by artifact  from hardware. L3-4: Status post fusion. The central canal and foramina appear open. L4-5: Status post laminectomy and fusion. The central canal and foramina appear open. L5-S1: The patient has a new right laminotomy defect. There is a fluid collection in the surgical bed measuring approximately 3.7 cm AP by 1.1 cm transverse by 2.3 cm craniocaudal. Fluid extends into the posterior subcutaneous tissues. Posterior to the L5 vertebral body, there is a focus most compatible with a disc protrusion measuring 2.4 cm transverse by 1.1 cm AP x 2.1 cm craniocaudal. The central canal appears decompressed and the disc is smaller than on the prior exam.  The foramina are open. IMPRESSION: Interval right laminotomy at L5-S1 for discectomy. Findings compatible with a disc protrusion are identified. The protrusion appears smaller than on the prior examination and the central canal and foramina are decompressed. Fluid collection in the laminectomy bed extending into the subcutaneous soft tissues cannot be definitively characterized but is likely a postoperative seroma given the patient's recent surgery. Status post T10-L5 fusion. The central canal and foramina appear open at these levels although multiple levels are obscured by artifact. 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 Electronically Signed   By: Inge Rise M.D.   On: 07/21/2015 16:50   Dg Chest Port 1 View  07/28/2015  CLINICAL DATA:  Ventilator dependent EXAM: PORTABLE CHEST 1 VIEW COMPARISON:  07/26/2015 FINDINGS: Cardiac shadow remains enlarged. An endotracheal tube, nasogastric catheter and right-sided PICC line are again identified and stable. Bilateral pleural effusions and vascular congestion with pulmonary edema are seen. The overall appearance is similar to that noted on the prior exam IMPRESSION: Changes of CHF with bilateral pleural effusions and parenchymal edema. Electronically Signed   By: Inez Catalina M.D.   On: 07/28/2015 07:27   Dg Chest Port 1 View  07/26/2015  CLINICAL DATA:  Follow-up pneumonia EXAM: PORTABLE CHEST 1 VIEW COMPARISON:  Portable chest x-ray of May 23, 2016 FINDINGS: The lungs are mildly hypoinflated. There is left lower lobe atelectasis or pneumonia with small left pleural effusion, stable. Mild infrahilar interstitial prominence on the right also stable. The cardiac silhouette remains enlarged. The central pulmonary vascularity is mildly prominent. The endotracheal tube tip lies 3.9 cm above the carina. The esophagogastric tube tip projects below the inferior margin of the image. The right-sided PICC line tip  projects over the midportion of the SVC. IMPRESSION: Persistent left lower lobe atelectasis and small left pleural effusion. Mild central pulmonary vascular congestion with mild interstitial edema not significantly changed. The support tubes are in reasonable position. Electronically Signed   By: David  Martinique M.D.   On: 07/26/2015 07:21   Dg Chest Port 1 View  07/25/2015  CLINICAL DATA:  75 year old male with sepsis. EXAM: PORTABLE CHEST 1 VIEW COMPARISON:  07/23/2015 and prior studies FINDINGS: The patient is rotated. An endotracheal tube with tip 4 cm above carina, right PICC line with tip overlying the superior cavoatrial junction, NG tube entering the stomach with tip off the field of view again noted. Persistent right lower lung airspace disease/ atelectasis, right perihilar opacity/atelectasis and left lower lung consolidations/atelectasis noted. Pulmonary vascular congestion has decreased. There may be trace pleural effusions. There is no evidence of pneumothorax. IMPRESSION: Decreased pulmonary vascular congestion without other significant change. Continued atelectasis/ airspace disease/ consolidation as described. Electronically Signed   By: Margarette Canada M.D.   On: 07/25/2015 07:26   Dg Chest Port 1 View  07/23/2015  CLINICAL DATA:  75 year old male with ventilator dependent respiratory failure. Sepsis. Initial encounter. EXAM: PORTABLE CHEST 1 VIEW COMPARISON:  07/22/2015 and earlier. FINDINGS: Portable AP semi upright view at 0405 hours. Stable endotracheal tube position. Enteric tube courses to the abdomen, tip not included. Stable right PICC line. Stable cardiomegaly and mediastinal contours. Interval decreased veiling opacity in the lungs but continued dense retrocardiac opacity at both bases. Partially visible spinal fusion hardware appears stable. No pneumothorax. No overt edema. IMPRESSION: 1.  Stable lines and tubes. 2. Bilateral pleural effusions are less apparent. No overt edema. Continued  dense bilateral lower lobe collapse or consolidation. Electronically Signed   By: Genevie Ann M.D.   On: 07/23/2015 06:36   Dg Chest Port 1 View  07/22/2015  CLINICAL DATA:  Ventilator dependent respiratory failure, coronary artery disease, sepsis, acute encephalopathy, left lower lobe pneumonia. EXAM: PORTABLE CHEST 1 VIEW COMPARISON:  Portable chest x-ray of July 21, 2015 FINDINGS: The lungs are reasonably well inflated. There are bilateral pleural effusions layering posteriorly. There is left lower lobe atelectasis or pneumonia. The cardiac silhouette remains enlarged. The pulmonary vascularity remains engorged. The pulmonary interstitial markings are slightly more conspicuous overall today. The endotracheal tube tip lies 4.7 cm above the carina. The esophagogastric tube tip projects below the inferior margin of the image. The PICC line tip projects over the midportion of the SVC. The patient has undergone lower thoracic lumbar an upper lumbar posterior fusion. IMPRESSION: CHF with mild pulmonary interstitial edema slightly more conspicuous today. Persistent left lower lobe atelectasis or pneumonia. Persistent bilateral pleural effusions layering posteriorly. The support tubes are in reasonable position. Electronically Signed   By: David  Martinique M.D.   On: 07/22/2015 08:33   Portable Chest Xray  07/21/2015  CLINICAL DATA:  Hypoxia EXAM: PORTABLE CHEST 1 VIEW COMPARISON:  July 21, 2015 FINDINGS: Endotracheal tube tip is 4.4 cm above the carina. Nasogastric tube tip and side port are below the diaphragm. Central catheter tip is in the superior vena cava. No pneumothorax. There is persistent pulmonary vascular congestion. There is increase in opacity in the left base region. There is postoperative change in the lower thoracic and visualized upper lumbar region. IMPRESSION: Tube and catheter positions as described without pneumothorax. Increased consolidation left base ; question developing pneumonia or  possibly aspiration. Pulmonary vascular congestion with a degree of congestive heart failure remains without change. Electronically Signed   By: Lowella Grip III M.D.   On: 07/21/2015 11:14   Dg Chest Port 1 View  07/21/2015  CLINICAL DATA:  Decreased oxygen saturation EXAM: PORTABLE CHEST 1 VIEW COMPARISON:  July 19, 2015 FINDINGS: Central catheter tip is in the superior vena cava. No pneumothorax. Interstitial edema remains, stable. There is no airspace consolidation. There is cardiomegaly with mild pulmonary venous hypertension. No new opacity. No adenopathy. There is postoperative change in the lower thoracic and visualized upper lumbar region. IMPRESSION: Evidence of a degree of congestive heart failure, stable. No new opacity. No change in cardiac silhouette. Central catheter tip in superior vena cava. No pneumothorax evident. Electronically Signed   By: Lowella Grip III M.D.   On: 07/21/2015 07:52   Dg Chest Port 1 View  07/19/2015  CLINICAL DATA:  Respiratory distress EXAM: PORTABLE CHEST 1 VIEW COMPARISON:  07/18/2015 FINDINGS: Cardiomegaly with vascular congestion and diffuse interstitial prominence compatible with edema. Right PICC line is in place with the tip at the cavoatrial junction. Possible layering left effusion. No acute bony abnormality. IMPRESSION: Findings compatible with CHF.  Possible small left effusion. Electronically Signed   By: Rolm Baptise M.D.   On: 07/19/2015 13:40   Dg Chest Port 1 View  07/18/2015  CLINICAL DATA:  Line placement EXAM: PORTABLE CHEST 1 VIEW COMPARISON:  07/18/2015 FINDINGS: Right-sided PICC line has been placed, tip overlying the level of the superior vena cava. The heart is enlarged. There is pulmonary vascular congestion. Opacity at the medial left lung base is increased, consistent with developing infiltrate and/or atelectasis. IMPRESSION: 1. Right-sided PICC line tip to the superior vena cava. 2. Developing left lower lobe infiltrate and/or  atelectasis. Electronically Signed   By: Nolon Nations M.D.   On: 07/18/2015 14:04   Dg Chest Port 1 View  07/18/2015  CLINICAL DATA:  Shortness of breath. EXAM: PORTABLE CHEST 1 VIEW COMPARISON:  July 17, 2015. FINDINGS: Stable cardiomegaly. No pneumothorax or pleural effusion is noted. Stable mild bibasilar subsegmental atelectasis is noted. Bony thorax is unremarkable. IMPRESSION: Stable mild bibasilar subsegmental atelectasis. Electronically Signed   By: Marijo Conception, M.D.   On: 07/18/2015 09:06   Dg Chest Port 1 View  07/17/2015  CLINICAL DATA:  Pt w/ back surgery x2 days ago, [facility] report temp of 102.0 on arrival and pt given 1gm APAP, pt also w/ SPO2 of 86% on room air, pt placed on simple mask at 6L/min and SPO2 up to 96%. Pt now under sepsis protocol and is experiencing acute confusion EXAM: PORTABLE CHEST 1 VIEW COMPARISON:  01/15/2015 FINDINGS: Heart is mildly enlarged. There are no focal consolidations or pleural effusions. There is mild left lower lobe subsegmental atelectasis or scarring. Less likely this could represent early infectious infiltrate. No evidence for pulmonary edema. Previous thoracic spine fusion. IMPRESSION: 1. Cardiomegaly without pulmonary edema. 2. Left lower lobe atelectasis versus early infiltrate. Followup is recommended. Electronically Signed   By: Nolon Nations M.D.   On: 07/17/2015 18:06    ASSESSMENT AND PLAN 75 yo who is s/p recent lumbar surgery on 2/2. Following d/c on 2/4 developed acute confusion and worsening weakness w/ fever > 102. He was brought to the ER. Admitted w/ working dx of sepsis felt due to PNA. Course complicated by shock and atrial fibrillation with RVR.    1. Acute hypoxemic respiratory failure: HCAP in immunocompromised host (RA meds). On broad spectrum antibiotics per ID, CCM. Extubated still with poor inspiratory effort and apiration Risk due to poor MS.  CXR with volume likely ARDS given normal EF by echo . Despite marked  azotemia would give iv bid lasix Will let CCM decide  2. Atrial fibrillation with RVR: Echo with normal EF, on amiodarone, esmolol and now digoxin iv rate improved in 100 range now post extubation  3. Hypotension: Suspect septic shock. per primary team   4. CAD: known h/o CAD no active ischemia    Jenkins Rouge

## 2015-07-28 NOTE — Procedures (Signed)
Extubation Procedure Note  Patient Details:   Name: Curtis Frazier DOB: Jul 07, 1940 MRN: [id number]   Airway Documentation:  Airway 8 mm (Active)  Secured at (cm) 24 cm 07/28/2015  8:25 AM  Measured From Lips 07/28/2015  8:25 AM  Secured Location Right 07/28/2015  8:25 AM  Secured By [employer] 07/28/2015  8:25 AM  Tube Holder Repositioned Yes 07/28/2015  8:25 AM  Cuff Pressure (cm H2O) 24 cm H2O 07/28/2015  2:42 AM  Site Condition Dry 07/28/2015  8:25 AM    Evaluation  O2 sats: stable throughout Complications: No apparent complications Patient did tolerate procedure well. Bilateral Breath Sounds: Clear Suctioning: Oral, Airway No unable to speak due to mental status.  Curtis Frazier, Eddie North 07/28/2015, 9:40 AM

## 2015-07-29 ENCOUNTER — Inpatient Hospital Stay (HOSPITAL_COMMUNITY): Payer: Medicare Other

## 2015-07-29 DIAGNOSIS — R400 Somnolence: Secondary | ICD-10-CM

## 2015-07-29 DIAGNOSIS — Y793 Surgical instruments, materials and orthopedic devices (including sutures) associated with adverse incidents: Secondary | ICD-10-CM

## 2015-07-29 DIAGNOSIS — J95851 Ventilator associated pneumonia: Secondary | ICD-10-CM | POA: Insufficient documentation

## 2015-07-29 DIAGNOSIS — Z7189 Other specified counseling: Secondary | ICD-10-CM | POA: Insufficient documentation

## 2015-07-29 DIAGNOSIS — Y838 Other surgical procedures as the cause of abnormal reaction of the patient, or of later complication, without mention of misadventure at the time of the procedure: Secondary | ICD-10-CM

## 2015-07-29 DIAGNOSIS — B379 Candidiasis, unspecified: Secondary | ICD-10-CM

## 2015-07-29 DIAGNOSIS — Z79899 Other long term (current) drug therapy: Secondary | ICD-10-CM

## 2015-07-29 DIAGNOSIS — Y95 Nosocomial condition: Secondary | ICD-10-CM

## 2015-07-29 DIAGNOSIS — G039 Meningitis, unspecified: Secondary | ICD-10-CM

## 2015-07-29 DIAGNOSIS — G9741 Accidental puncture or laceration of dura during a procedure: Secondary | ICD-10-CM

## 2015-07-29 DIAGNOSIS — N189 Chronic kidney disease, unspecified: Secondary | ICD-10-CM

## 2015-07-29 DIAGNOSIS — I509 Heart failure, unspecified: Secondary | ICD-10-CM

## 2015-07-29 DIAGNOSIS — I480 Paroxysmal atrial fibrillation: Secondary | ICD-10-CM

## 2015-07-29 LAB — BASIC METABOLIC PANEL
ANION GAP: 14 (ref 5–15)
Anion gap: 13 (ref 5–15)
BUN: 67 mg/dL — AB (ref 6–20)
BUN: 55 mg/dL — AB (ref 6–20)
CALCIUM: 8 mg/dL — AB (ref 8.9–10.3)
CHLORIDE: 115 mmol/L — AB (ref 101–111)
CO2: 24 mmol/L (ref 22–32)
CO2: 26 mmol/L (ref 22–32)
CREATININE: 1.28 mg/dL — AB (ref 0.61–1.24)
Calcium: 8.1 mg/dL — ABNORMAL LOW (ref 8.9–10.3)
Chloride: 116 mmol/L — ABNORMAL HIGH (ref 101–111)
Creatinine, Ser: 1.24 mg/dL (ref 0.61–1.24)
GFR calc Af Amer: >60 mL/min (ref >60)
GFR, EST NON AFRICAN AMERICAN: 53 mL/min — AB (ref >60)
GFR, EST NON AFRICAN AMERICAN: 55 mL/min — AB (ref >60)
Glucose, Bld: 143 mg/dL — ABNORMAL HIGH (ref 65–99)
Glucose, Bld: 127 mg/dL — ABNORMAL HIGH (ref 65–99)
POTASSIUM: 3.7 mmol/L (ref 3.5–5.1)
POTASSIUM: 3.1 mmol/L — AB (ref 3.5–5.1)
SODIUM: 153 mmol/L — AB (ref 135–145)
SODIUM: 155 mmol/L — AB (ref 135–145)

## 2015-07-29 LAB — CBC
HCT: 29.2 % — ABNORMAL LOW (ref 39.0–52.0)
Hemoglobin: 9.2 g/dL — ABNORMAL LOW (ref 13.0–17.0)
MCH: 30.8 pg (ref 26.0–34.0)
MCHC: 31.5 g/dL (ref 30.0–36.0)
MCV: 97.7 fL (ref 78.0–100.0)
PLATELETS: 294 10*3/uL (ref 150–400)
RBC: 2.99 MIL/uL — AB (ref 4.22–5.81)
RDW: 17.6 % — AB (ref 11.5–15.5)
WBC: 9.5 10*3/uL (ref 4.0–10.5)

## 2015-07-29 LAB — GLUCOSE, CAPILLARY
GLUCOSE-CAPILLARY: 117 mg/dL — AB (ref 65–99)
GLUCOSE-CAPILLARY: 139 mg/dL — AB (ref 65–99)
GLUCOSE-CAPILLARY: 140 mg/dL — AB (ref 65–99)
GLUCOSE-CAPILLARY: 85 mg/dL (ref 65–99)
Glucose-Capillary: 102 mg/dL — ABNORMAL HIGH (ref 65–99)
Glucose-Capillary: 94 mg/dL (ref 65–99)

## 2015-07-29 LAB — HEPARIN LEVEL (UNFRACTIONATED): HEPARIN UNFRACTIONATED: 0.68 [IU]/mL (ref 0.30–0.70)

## 2015-07-29 MED ORDER — POTASSIUM CHLORIDE 10 MEQ/100ML IV SOLN
10.0000 meq | INTRAVENOUS | Status: AC
  Administered 2015-07-29 (×3): 10 meq via INTRAVENOUS
  Filled 2015-07-29 (×4): qty 100

## 2015-07-29 MED ORDER — ESMOLOL HCL-SODIUM CHLORIDE 2000 MG/100ML IV SOLN
25.0000 ug/kg/min | INTRAVENOUS | Status: DC
  Administered 2015-07-29: 50 ug/kg/min via INTRAVENOUS
  Filled 2015-07-29: qty 100

## 2015-07-29 MED ORDER — POLYETHYLENE GLYCOL 3350 17 G PO PACK
17.0000 g | PACK | Freq: Every day | ORAL | Status: DC
Start: 2015-07-29 — End: 2015-08-03
  Administered 2015-07-29 – 2015-08-03 (×6): 17 g
  Filled 2015-07-29 (×6): qty 1

## 2015-07-29 MED ORDER — HEPARIN (PORCINE) IN NACL 100-0.45 UNIT/ML-% IJ SOLN
1700.0000 [IU]/h | INTRAMUSCULAR | Status: DC
  Administered 2015-07-29 (×2): 1700 [IU]/h via INTRAVENOUS
  Filled 2015-07-29 (×3): qty 250

## 2015-07-29 MED ORDER — DILTIAZEM HCL 30 MG PO TABS
30.0000 mg | ORAL_TABLET | Freq: Four times a day (QID) | ORAL | Status: DC
  Administered 2015-07-29 – 2015-07-31 (×5): 30 mg via ORAL
  Filled 2015-07-29 (×8): qty 1

## 2015-07-29 MED ORDER — HEPARIN BOLUS VIA INFUSION
4000.0000 [IU] | Freq: Once | INTRAVENOUS | Status: AC
  Administered 2015-07-29: 4000 [IU] via INTRAVENOUS
  Filled 2015-07-29: qty 4000

## 2015-07-29 MED ORDER — DEXTROSE 5 % IV SOLN
INTRAVENOUS | Status: DC
  Administered 2015-07-29: 12:00:00 via INTRAVENOUS

## 2015-07-29 MED ORDER — PRO-STAT SUGAR FREE PO LIQD
30.0000 mL | Freq: Every day | ORAL | Status: DC
  Administered 2015-07-30 – 2015-08-03 (×5): 30 mL
  Filled 2015-07-29 (×6): qty 30

## 2015-07-29 MED ORDER — JEVITY 1.2 CAL PO LIQD
1000.0000 mL | ORAL | Status: DC
  Administered 2015-07-30: 20 mL/h
  Administered 2015-07-30 – 2015-08-02 (×5): 1000 mL
  Filled 2015-07-29 (×11): qty 1000

## 2015-07-29 MED ORDER — SODIUM CHLORIDE 0.9 % IV SOLN
1.0000 g | Freq: Three times a day (TID) | INTRAVENOUS | Status: AC
  Administered 2015-07-29 – 2015-07-30 (×6): 1 g via INTRAVENOUS
  Filled 2015-07-29 (×6): qty 1

## 2015-07-29 NOTE — Progress Notes (Signed)
Daily Progress Note   Patient Name: Curtis Frazier       Date: 07/29/2015 DOB: 1941-02-05  Age: 75 y.o. MRN#: [id number] Attending Physician: Marshell Garfinkel, MD Primary Care Physician: Loura Pardon, MD Admit Date: 07/17/2015    75 yo male with RA on immunosuppressive medications, DM, CAD, DDD, who is s/p recent lumbar surgery on 2/2. Following d/c on 2/4 developed acute confusion and worsening weakness w/ fever. He was brought to the ER. Admitted w/ working dx of sepsis felt due to HCAP. Course complicated by shock and atrial fibrillation with RVR. Intubated 2/8 - 2/15.  On CCM service, Cards and ID are on board as well.  Reason for Consultation/Follow-up: Establishing goals of care  Subjective:  When asked if there is anything he wants the patient coughs "ice cream".  Speech evaluation pending.   Interval Events: Meet daughter in law at bedside.  Questions answered.  I left her with my card.  Very supportive family.  Hope is for a full recovery.   Length of Stay: 12 days  Current Medications: Scheduled Meds:  . acetylcysteine  4 mL Nebulization Q12H  . antiseptic oral rinse  7 mL Mouth Rinse QID  . chlorhexidine gluconate  15 mL Mouth Rinse BID  . digoxin  0.125 mg Intravenous Daily  . folic acid  1 mg Per Tube Daily  . hydrocortisone sod succinate (SOLU-CORTEF) inj  50 mg Intravenous Q6H  . insulin aspart  0-20 Units Subcutaneous 6 times per day  . insulin aspart  3 Units Subcutaneous 6 times per day  . insulin NPH Human  15 Units Subcutaneous BID AC & HS  . levalbuterol  0.63 mg Nebulization Q6H  . meropenem (MERREM) IV  1 g Intravenous 3 times per day  . pantoprazole sodium  40 mg Per Tube Daily  . polyethylene glycol  17 g Per Tube Daily  . sodium chloride flush  10-40 mL  Intracatheter Q12H  . vancomycin  750 mg Intravenous Q24H    Continuous Infusions: . sodium chloride 10 mL/hr (07/28/15 0734)  . amiodarone 30 mg/hr (07/29/15 0825)  . esmolol 100 mcg/kg/min (07/29/15 0825)  . phenylephrine (NEO-SYNEPHRINE) Adult infusion Stopped (07/28/15 0023)    PRN Meds: bisacodyl, docusate, fentaNYL (SUBLIMAZE) injection, levalbuterol, sodium chloride flush  Physical Exam  Wd, wn elderly male, NAD, venti mask in place, weak but productive cough. CV RRR Resp:  Coarse breath sounds bilaterally Extremities:  1+ edema bilaterally.    Vital Signs: BP 148/91 mmHg  Pulse 104  Temp(Src) 98 F (36.7 C) (Oral)  Resp 14  Ht 5\' 8"  (1.727 m)  Wt 95.5 kg (210 lb 8.6 oz)  BMI 32.02 kg/m2  SpO2 100% SpO2: SpO2: 100 % O2 Device: O2 Device: Ventilator O2 Flow Rate: O2 Flow Rate (L/min): 14 L/min  Intake/output summary:  Intake/Output Summary (Last 24 hours) at 07/29/15 0915 Last data filed at 07/29/15 0900  Gross per 24 hour  Intake 1474.96 ml  Output   3175 ml  Net -1700.04 ml   LBM: Last BM Date: 07/28/15 Baseline Weight: Weight: 83.915 kg (185 lb) Most recent weight: Weight: 95.5 kg (210 lb 8.6 oz)       Palliative Assessment/Data: Flowsheet Rows        Most Recent Value   Intake Tab    Referral Department  Hospitalist   Unit at Time of Referral  Intermediate Care Unit   Palliative Care Primary Diagnosis  Cardiac   Date Notified  07/19/15   Palliative Care Type  New Palliative care   Reason for referral  Clarify Goals of Care   Date of Admission  07/17/15   Date first seen by Palliative Care  07/20/15   # of days Palliative referral response time  1 Day(s)   # of days IP prior to Palliative referral  2   Clinical Assessment    Psychosocial & Spiritual Assessment    Palliative Care Outcomes       Additional Data Reviewed: CBC    Component Value Date/Time   WBC 9.5 07/29/2015 0644   RBC 2.99* 07/29/2015 0644   RBC 3.52*  12/19/2013 0819   HGB 9.2* 07/29/2015 0644   HCT 29.2* 07/29/2015 0644   PLT 294 07/29/2015 0644   MCV 97.7 07/29/2015 0644   MCH 30.8 07/29/2015 0644   MCHC 31.5 07/29/2015 0644   RDW 17.6* 07/29/2015 0644   LYMPHSABS 1.2 07/26/2015 0422   MONOABS 1.1* 07/26/2015 0422   EOSABS 0.3 07/26/2015 0422   BASOSABS 0.0 07/26/2015 0422    CMP     Component Value Date/Time   NA 153* 07/29/2015 0644   K 3.7 07/29/2015 0644   CL 116* 07/29/2015 0644   CO2 24 07/29/2015 0644   GLUCOSE 143* 07/29/2015 0644   BUN 67* 07/29/2015 0644   CREATININE 1.28* 07/29/2015 0644   CALCIUM 8.0* 07/29/2015 0644   PROT 6.6 07/28/2015 0832   ALBUMIN 2.3* 07/28/2015 0832   AST 47* 07/28/2015 0832   ALT 69* 07/28/2015 0832   ALKPHOS 135* 07/28/2015 0832   BILITOT 0.6 07/28/2015 0832   GFRNONAA 53* 07/29/2015 0644   GFRAA >60 07/29/2015 0644       Problem List:  Patient Active Problem List   Diagnosis Date Noted  . Change in mental state   . Endotracheally intubated   . CHF (congestive heart failure) ([place])   . Palliative care encounter 07/21/2015  . DNR (do not resuscitate) discussion 07/21/2015  . HCAP (healthcare-associated pneumonia)   . Acute respiratory failure ([place])   . Acute respiratory failure with hypoxia ([place]) 07/18/2015  . Atrial fibrillation with RVR ([place]) 07/18/2015  . Acute pulmonary edema ([place]) 07/18/2015  . Sepsis ([place]) 07/17/2015  . Acute encephalopathy 07/17/2015  . LLL pneumonia 07/17/2015  . Type II or unspecified type diabetes  mellitus without mention of complication, not stated as uncontrolled 07/17/2015  . S/P lumbar laminectomy 07/17/2015  . Elevated troponin 07/17/2015  . Type 2 diabetes mellitus without complication, without long-term current use of insulin ([place])   . Acute bronchitis 03/05/2015  . Back pain, thoracic 01/15/2015  . Chest wall pain 01/15/2015  . Routine general medical examination at a health care facility 11/23/2014  . Productive cough 10/14/2014   . Left flank pain 09/25/2014  . Renal insufficiency 11/14/2013  . Encounter for Medicare annual wellness exam 07/07/2013  . BPH (benign prostatic hyperplasia) 07/05/2012  . Prostate cancer screening 06/27/2012  . Low TSH level 01/22/2012  . PMR (polymyalgia rheumatica) ([facility]) 01/20/2012  . Aortic stenosis 07/08/2011  . New onset a-fib ([place]) 07/05/2011    Class: Acute  . HYPERKALEMIA 02/18/2010  . Essential hypertension, benign 09/13/2009  . ANEMIA-UNSPECIFIED 06/10/2009  . ANGIODYSPLASIA-INTESTINE 06/10/2009  . MURMUR 05/21/2009  . Left carotid bruit 05/21/2009  . Coronary atherosclerosis 03/09/2009  . GERD 03/09/2009  . Rheumatoid arthritis ([place]) 04/29/2008  . HX, PERSONAL, COLONIC POLYPS 01/17/2007  . Diabetes mellitus type 2, controlled, without complications ([place]) [id number]  . Elevated lipids 09/18/2006  . PSORIATIC ARTHRITIS 09/18/2006  . PSORIASIS 09/18/2006  . 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 DEGENERATIVE DISC DISEASE 09/18/2006  . SCOLIOSIS 09/18/2006  . TOBACCO ABUSE, HX OF 09/18/2006     Palliative Care Assessment & Plan    1.Code Status:  Full code    Code Status Orders        Start     Ordered   07/17/15 2257  Full code   Continuous     07/17/15 2257    Code Status History    Date Active Date Inactive Code Status Order ID Comments User Context   This patient has a current code status but no historical code status.       2. Goals of Care/Additional Recommendations:   Limitations on Scope of Treatment: Full Comfort Care   Excellent family support.    3. Prognosis: Unable to determine   Care plan was discussed with dtr in law, [name] at bedside.  Thank you for allowing the Palliative Medicine Team to assist in the care of this patient.   Time In: 9:00 Time Out: 9:25 Total Time 25 min Prolonged Time Billed no        Imogene Burn, [place] [facility] Pager: [phone]  07/29/2015, 9:15 AM  Please contact Palliative Medicine  Team phone at [phone] for questions and concerns.

## 2015-07-29 NOTE — Progress Notes (Signed)
Patient ID: Curtis Frazier, male   DOB: 1940-12-21, 75 y.o.   MRN: [id number]   Patient Name: Curtis Frazier Date of Encounter: 07/29/2015   SUBJECTIVE  Extubated, on facemask O2, alert, responsive   CURRENT MEDS . acetylcysteine  4 mL Nebulization Q12H  . antiseptic oral rinse  7 mL Mouth Rinse QID  . chlorhexidine gluconate  15 mL Mouth Rinse BID  . digoxin  0.125 mg Intravenous Daily  . folic acid  1 mg Per Tube Daily  . hydrocortisone sod succinate (SOLU-CORTEF) inj  50 mg Intravenous Q6H  . insulin aspart  0-20 Units Subcutaneous 6 times per day  . insulin aspart  3 Units Subcutaneous 6 times per day  . insulin NPH Human  15 Units Subcutaneous BID AC & HS  . levalbuterol  0.63 mg Nebulization Q6H  . meropenem (MERREM) IV  1 g Intravenous 3 times per day  . pantoprazole sodium  40 mg Per Tube Daily  . polyethylene glycol  17 g Per Tube Daily  . sodium chloride flush  10-40 mL Intracatheter Q12H  . vancomycin  750 mg Intravenous Q24H    OBJECTIVE  Filed Vitals:   07/29/15 0750 07/29/15 0800 07/29/15 0900 07/29/15 1000  BP:  148/91 144/106 157/105  Pulse:  104 99 91  Temp: 98 F (36.7 C)     TempSrc: Oral     Resp:  14 21 13   Height:      Weight:      SpO2:  100% 98% 99%    Intake/Output Summary (Last 24 hours) at 07/29/15 1138 Last data filed at 07/29/15 1000  Gross per 24 hour  Intake 1458.26 ml  Output   2925 ml  Net -1466.74 ml   Filed Weights   07/27/15 0257 07/28/15 0500 07/29/15 0404  Weight: 217 lb 9.5 oz (98.7 kg) 216 lb 11.4 oz (98.3 kg) 210 lb 8.6 oz (95.5 kg)    PHYSICAL EXAM  General: Ill appearing. On facemask O2 Neuro: Awake and responds to questions Lungs: BS decreased at bases bilat Heart: IR IR -rate 98 Abdomen: distended, BS present  Extremities: No clubbing, cyanosis. Stocking in place  Accessory Clinical Findings  CBC  Recent Labs  07/28/15 0832 07/29/15 0644  WBC 10.5 9.5  HGB 9.4* 9.2*  HCT 29.5* 29.2*  MCV 100.3* 97.7  PLT  311 [id number]   Basic Metabolic Panel  Recent Labs  07/27/15 0625 07/28/15 0832 07/29/15 0644  NA 143 146* 153*  K 4.6 4.4 3.7  CL 108 111 116*  CO2 24 24 24   GLUCOSE 242* 153* 143*  BUN 79* 96* 67*  CREATININE 1.49* 1.68* 1.28*  CALCIUM 7.3* 7.8* 8.0*  MG 2.1  --   --   PHOS 3.9  --   --    Liver Function Tests  Recent Labs  07/27/15 0625 07/28/15 0832  AST 96* 47*  ALT 77* 69*  ALKPHOS 156* 135*  BILITOT 0.5 0.6  PROT 5.7* 6.6  ALBUMIN 1.8* 2.3*   Cardiac Enzymes No results for input(s): CKTOTAL, CKMB, CKMBINDEX, TROPONINI in the last 72 hours.   Recent Labs  07/27/15 0445  TRIG 123    TELE-afib at rate of 90-110s  Echo 07/20/15 Study Conclusions  - Left ventricle: The cavity size was normal. Wall thickness was normal. Systolic function was vigorous. The estimated ejection fraction was in the range of 65% to 70%. Indeterminant diastolic function. Although no diagnostic regional wall motion abnormality was identified, this possibility  cannot be completely excluded on the basis of this study. - Aortic valve: Moderately calcified leaflets. There was no stenosis. - Mitral valve: Mildly calcified annulus. There was trivial regurgitation. - Right ventricle: The cavity size was normal. Systolic function was normal. - Tricuspid valve: Peak RV-RA gradient (S): 29 mm Hg. - Pulmonary arteries: PA peak pressure: 32 mm Hg (S). - Inferior vena cava: The vessel was normal in size. The respirophasic diameter changes were in the normal range (>= 50%), consistent with normal central venous pressure.  Impressions:  - Technically difficult study with poor acoustic windows. Normal LV size with EF 65-70%. Normal RV size and systolic function. No significant valvular abnormalities.  Radiology/Studies  Dg Chest Port 1 View  07/29/2015  CLINICAL DATA:  Respiratory failure, CHF, sepsis, acute encephalopathy EXAM: PORTABLE CHEST 1 VIEW COMPARISON:   Portable chest x-ray of July 28, 2015 FINDINGS: The lungs are adequately inflated. The pulmonary interstitial markings are slightly less conspicuous overall today. The right hemidiaphragm is sharp. On the left the hemidiaphragm is now partially visible. The cardiac silhouette remains enlarged. The pulmonary vascularity is engorged but more distinct today. The mediastinum is normal in width. There has been interval extubation of the trachea and of the esophagus. The PICC line tip projects over the junction of the proximal and midportions of the SVC. IMPRESSION: Mild interval improvement in pulmonary interstitial edema secondary to CHF. Decreasing bilateral pleural effusions. Interval extubation of the trachea and esophagus. Electronically Signed   By: David  Martinique M.D.   On: 07/29/2015 07:26      ASSESSMENT AND PLAN 75 yo who is s/p recent lumbar surgery on 2/2. Following d/c on 2/4 developed acute confusion and worsening weakness w/ fever > 102. He was brought to the ER 07/17/15 and admitted w/ working dx of sepsis felt due to PNA. Course complicated by shock, respiratory failure requiring intubation, and atrial fibrillation with RVR. He is now extubated.    1. Acute hypoxemic respiratory failure: HCAP in immunocompromised host (RA meds). On broad spectrum antibiotics per ID, CCM. Extubated still with poor inspiratory effort and apiration Risk due to poor MS.  CXR with volume likely ARDS given normal EF by echo . Despite marked azotemia would give iv bid lasix Will let CCM decide  2. Atrial fibrillation with RVR: Echo with normal EF, on amiodarone, esmolol and now digoxin iv rate improved in 100 range now post extubation. Hx of past PAF as well. Not on anticoagulation prior to admission.   3. Hypotension: Suspect septic shock. per primary team   4. CAD: known h/o CAD-s/p CFX BMS 2010, Myoview low risk Nov 2016, no active ischemia   5. CRI stage 3  6. Hx of PMR requiring chronic  steroids  7. Hx of GI bleed 2014- AVMs  Plan: Dr Johnsie Cancel to see- he is currently on Esmolol, Amiodarone, and Lanoxin IV. Not sure he is taking po's yet but it appears Diltiazem 30 mg Q 6 ordered to start today.    Kerin Ransom PA-C 07/29/2015 11:51 AM   BP improved  If taking PO ok to change to cardizem 30 q6 or lopressor 25 q8 and stop esmolol drip 2 hrs after Starting oral meds.  Decreased BS at base still with aspiration risk. Overall hemodynamics seem to be improving  Jenkins Rouge

## 2015-07-29 NOTE — Progress Notes (Signed)
ANTICOAGULATION CONSULT NOTE - Initial Consult  Pharmacy Consult for heparin Indication: atrial fibrillation  Allergies  Allergen Reactions  . Ace Inhibitors Other (See Comments)    Causes high K  . Angiotensin Receptor Blockers Other (See Comments)    Causes high K  . Metoprolol Other (See Comments)    May cause elevated K level  . Penicillins Swelling and Rash  . Tetracycline Swelling and Rash    Patient Measurements: Height: 5\' 8"  (172.7 cm) Weight: 210 lb 8.6 oz (95.5 kg) IBW/kg (Calculated) : 68.4 Heparin Dosing Weight: 88.5 kg  Vital Signs: Temp: 98 F (36.7 C) (02/16 0750) Temp Source: Oral (02/16 0750) BP: 157/105 mmHg (02/16 1000) Pulse Rate: 91 (02/16 1000)  Labs:  Recent Labs  07/26/15 1624 07/27/15 0445  07/27/15 0625 07/27/15 1057 07/28/15 0832 07/29/15 0644  HGB  --   --   < > 8.4*  --  9.4* 9.2*  HCT  --   --   --  26.2*  --  29.5* 29.2*  PLT  --   --   --  303  --  311 294  APTT >200* 26  --   --  23.7  --   --   LABPROT 24.4* 15.9*  --   --   --  15.3*  --   INR 2.22* 1.25  --   --   --  1.19  --   CREATININE  --   --   --  1.49*  --  1.68* 1.28*  < > = values in this interval not displayed.  Estimated Creatinine Clearance: 55 mL/min (by C-G formula based on Cr of 1.28).   Medical History: Past Medical History  Diagnosis Date  . Other and unspecified hyperlipidemia   . Coronary atherosclerosis of unspecified type of vessel, native or graft   . Personal history of colonic polyps   . Actinic keratosis   . Psoriatic arthropathy ([place])   . Other psoriasis   . Scoliosis (and kyphoscoliosis), idiopathic   . GI bleed 12/10    cecam AVM  . Gastritis 12/10  . Hyperlipidemia     takes Pravastatin daily  . Hypertension     takes Metoprolol daily  . Myocardial infarction ([place]) 2010  . Shortness of breath     with exertion  . Pneumonia     hx of-2008  . Headache(784.0)     occasionally  . Joint pain   . Joint swelling   . Rheumatoid  arthritis(714.0)     takes Methotrexate 7pills weekly  . Degeneration of intervertebral disc, site unspecified   . Chronic back pain     scoliosis/stenosis/radiculopathy,degenerative disc disease  . Psoriasis   . Bruises easily   . Esophageal reflux     takes Omeprazole daily  . History of colonic polyps   . Hemorrhoids   . Kidney stone     hx of  . Blood transfusion 2010  . Type II or unspecified type diabetes mellitus without mention of complication, not stated as uncontrolled     type II;controlled by diet and exercise  . PMR (polymyalgia rheumatica) ([facility]) 01/20/2012    tx by specialist on a long prednisone taper  . S/P PTCA (percutaneous transluminal coronary angioplasty)    Assessment: 75 yo m with new onset afib.  Heparin was stopped on 2/13 for elevated PT/INR.  Anticoagulation studies normal, now INR wnl. Pharmacy is consulted to restart heparin today. Previous heparin infusion was therapeutic for multiple days on  1750 units/hr, then was slightly high the day it was discontinued. Will start back around 1700 units/hr (~19 units/kg/hr). CBC ok, no issues per RN.  Goal of Therapy:  Heparin level 0.3-0.7 units/ml Monitor platelets by anticoagulation protocol: Yes   Plan:  Heparin bolus 4,000 units x 1 Heparin infusion at 1700 units/hr F/u 8-hr HL @ 2030 Daily HL, CBC Monitor s/s of bleeding  Cassie L. Nicole Kindred, PharmD PGY2 Infectious Diseases Pharmacy Resident Pager: [phone] 07/29/2015 12:10 PM

## 2015-07-29 NOTE — Progress Notes (Signed)
UR Completed. Twanda Stakes, RN, BSN.  [phone] 

## 2015-07-29 NOTE — Progress Notes (Signed)
INFECTIOUS DISEASE PROGRESS NOTE  ID: Curtis Frazier is a 75 y.o. male with  Principal Problem:   Sepsis ([place]) Active Problems:   Essential hypertension, benign   Coronary atherosclerosis   Rheumatoid arthritis ([place])   Acute encephalopathy   LLL pneumonia   Type II or unspecified type diabetes mellitus without mention of complication, not stated as uncontrolled   S/P lumbar laminectomy   Elevated troponin   Acute respiratory failure with hypoxia (HCC)   Atrial fibrillation with RVR (HCC)   Acute pulmonary edema (HCC)   HCAP (healthcare-associated pneumonia)   Palliative care encounter   DNR (do not resuscitate) discussion   Acute respiratory failure ([place])   Endotracheally intubated   CHF (congestive heart failure) ([place])   Change in mental state   Advanced care planning/counseling discussion  Subjective: Feels poorly.   Abtx:  Anti-infectives    Start     Dose/Rate Route Frequency Ordered Stop   07/29/15 0845  meropenem (MERREM) 1 g in sodium chloride 0.9 % 100 mL IVPB     1 g 200 mL/hr over 30 Minutes Intravenous 3 times per day 07/29/15 0837     07/27/15 0600  vancomycin (VANCOCIN) IVPB 750 mg/150 ml premix     750 mg 150 mL/hr over 60 Minutes Intravenous Every 24 hours 07/26/15 0640 07/30/15 2359   07/26/15 1415  meropenem (MERREM) 1 g in sodium chloride 0.9 % 100 mL IVPB  Status:  Discontinued     1 g 200 mL/hr over 30 Minutes Intravenous Every 12 hours 07/26/15 1407 07/29/15 0837   07/22/15 1045  azithromycin (ZITHROMAX) 500 mg in dextrose 5 % 250 mL IVPB     500 mg 250 mL/hr over 60 Minutes Intravenous Every 24 hours 07/22/15 1037 07/26/15 1018   07/18/15 0900  ceFEPIme (MAXIPIME) 2 g in dextrose 5 % 50 mL IVPB  Status:  Discontinued     2 g 100 mL/hr over 30 Minutes Intravenous Every 24 hours 07/18/15 0852 07/26/15 1354   07/18/15 0600  vancomycin (VANCOCIN) IVPB 750 mg/150 ml premix  Status:  Discontinued     750 mg 150 mL/hr over 60 Minutes Intravenous Every  12 hours 07/17/15 1807 07/26/15 0632   07/18/15 0500  cefTRIAXone (ROCEPHIN) 2 g in dextrose 5 % 50 mL IVPB  Status:  Discontinued     2 g 100 mL/hr over 30 Minutes Intravenous Every 12 hours 07/17/15 1807 07/18/15 0852   07/18/15 0000  ampicillin (OMNIPEN) 2 g in sodium chloride 0.9 % 50 mL IVPB  Status:  Discontinued     2 g 150 mL/hr over 20 Minutes Intravenous 6 times per day 07/17/15 1807 07/18/15 0852   07/17/15 1800  cefTRIAXone (ROCEPHIN) 2 g in dextrose 5 % 50 mL IVPB     2 g 100 mL/hr over 30 Minutes Intravenous  Once 07/17/15 1749 07/17/15 2055   07/17/15 1800  vancomycin (VANCOCIN) IVPB 1000 mg/200 mL premix     1,000 mg 200 mL/hr over 60 Minutes Intravenous  Once 07/17/15 1749 07/17/15 2006   07/17/15 1800  ampicillin (OMNIPEN) 2 g in sodium chloride 0.9 % 50 mL IVPB     2 g 150 mL/hr over 20 Minutes Intravenous  Once 07/17/15 1749 07/17/15 1838      Medications:  Scheduled: . acetylcysteine  4 mL Nebulization Q12H  . antiseptic oral rinse  7 mL Mouth Rinse QID  . chlorhexidine gluconate  15 mL Mouth Rinse BID  . digoxin  0.125  mg Intravenous Daily  . folic acid  1 mg Per Tube Daily  . hydrocortisone sod succinate (SOLU-CORTEF) inj  50 mg Intravenous Q6H  . insulin aspart  0-20 Units Subcutaneous 6 times per day  . insulin aspart  3 Units Subcutaneous 6 times per day  . insulin NPH Human  15 Units Subcutaneous BID AC & HS  . levalbuterol  0.63 mg Nebulization Q6H  . meropenem (MERREM) IV  1 g Intravenous 3 times per day  . pantoprazole sodium  40 mg Per Tube Daily  . polyethylene glycol  17 g Per Tube Daily  . sodium chloride flush  10-40 mL Intracatheter Q12H  . vancomycin  750 mg Intravenous Q24H    Objective: Vital signs in last 24 hours: Temp:  [97.7 F (36.5 C)-99 F (37.2 C)] 98 F (36.7 C) (02/16 0750) Pulse Rate:  [52-118] 104 (02/16 0800) Resp:  [9-18] 14 (02/16 0800) BP: (98-154)/(63-105) 148/91 mmHg (02/16 0800) SpO2:  [85 %-100 %] 100 % (02/16  0800) FiO2 (%):  [50 %-55 %] 55 % (02/16 0715) Weight:  [95.5 kg (210 lb 8.6 oz)] 95.5 kg (210 lb 8.6 oz) (02/16 0404)   General appearance: fatigued Resp: diminished breath sounds anterior - bilateral and rhonchi bilaterally Cardio: regular rate and rhythm GI: normal findings: bowel sounds normal and soft, non-tender and abnormal findings:  distended Extremities: edema 3+  Lab Results  Recent Labs  07/28/15 0832 07/29/15 0644  WBC 10.5 9.5  HGB 9.4* 9.2*  HCT 29.5* 29.2*  NA 146* 153*  K 4.4 3.7  CL 111 116*  CO2 24 24  BUN 96* 67*  CREATININE 1.68* 1.28*   Liver Panel  Recent Labs  07/27/15 0625 07/28/15 0832  PROT 5.7* 6.6  ALBUMIN 1.8* 2.3*  AST 96* 47*  ALT 77* 69*  ALKPHOS 156* 135*  BILITOT 0.5 0.6   Sedimentation Rate No results for input(s): ESRSEDRATE in the last 72 hours. C-Reactive Protein No results for input(s): CRP in the last 72 hours.  Microbiology: Recent Results (from the past 240 hour(s))  Culture, respiratory (NON-Expectorated)     Status: None   Collection Time: 07/21/15  9:50 AM  Result Value Ref Range Status   Specimen Description BRONCHIAL ALVEOLAR LAVAGE  Final   Special Requests Normal  Final   Gram Stain   Final    RARE WBC PRESENT, PREDOMINANTLY PMN NO SQUAMOUS EPITHELIAL CELLS SEEN NO ORGANISMS SEEN Performed at [facility]    Culture   Final    NO GROWTH 2 DAYS Performed at [facility]    Report Status 07/23/2015 FINAL  Final  AFB culture with smear (NOT at [facility])     Status: None (Preliminary result)   Collection Time: 07/21/15 10:34 AM  Result Value Ref Range Status   Specimen Description BRONCHIAL ALVEOLAR LAVAGE  Final   Special Requests Normal  Final   Acid Fast Smear   Final    NO ACID FAST BACILLI SEEN Performed at [facility]    Culture   Final    CULTURE WILL BE EXAMINED FOR 6 WEEKS BEFORE ISSUING A FINAL REPORT Performed at [facility]    Report Status PENDING   Incomplete  Fungus Culture with Smear     Status: None (Preliminary result)   Collection Time: 07/21/15 10:34 AM  Result Value Ref Range Status   Specimen Description BRONCHIAL ALVEOLAR LAVAGE  Final   Special Requests Normal  Final   Fungal Smear  Final    NO YEAST OR FUNGAL ELEMENTS SEEN Performed at [facility]    Culture   Final    CULTURE IN PROGRESS FOR FOUR WEEKS Performed at [facility]    Report Status PENDING  Incomplete  Pneumocystis smear by DFA     Status: None   Collection Time: 07/21/15 10:34 AM  Result Value Ref Range Status   Specimen Source-PJSRC BRONCHIAL ALVEOLAR LAVAGE  Final   Pneumocystis jiroveci Ag NEGATIVE  Final    Comment: Performed at [facility]  Respiratory virus panel     Status: None   Collection Time: 07/21/15  7:45 PM  Result Value Ref Range Status   Respiratory Syncytial Virus A Negative Negative Final   Respiratory Syncytial Virus B Negative Negative Final   Influenza A Negative Negative Final   Influenza B Negative Negative Final   Parainfluenza 1 Negative Negative Final   Parainfluenza 2 Negative Negative Final   Parainfluenza 3 Negative Negative Final   Metapneumovirus Negative Negative Final   Rhinovirus Negative Negative Final   Adenovirus Negative Negative Final    Comment: (NOTE) Performed At: [facility] [address] [place], [place] [id number] Lindon Romp MD [id number]   Culture, respiratory (NON-Expectorated)     Status: None   Collection Time: 07/25/15  3:20 PM  Result Value Ref Range Status   Specimen Description BRONCHIAL ALVEOLAR LAVAGE  Final   Special Requests NONE  Final   Gram Stain   Final    NO WBC SEEN NO SQUAMOUS EPITHELIAL CELLS SEEN NO ORGANISMS SEEN Performed at [facility]    Culture   Final    FEW YEAST CONSISTENT WITH CANDIDA SPECIES Performed at [facility]    Report Status 07/27/2015 FINAL  Final    Studies/Results: Dg  Chest Port 1 View  07/29/2015  CLINICAL DATA:  Respiratory failure, CHF, sepsis, acute encephalopathy EXAM: PORTABLE CHEST 1 VIEW COMPARISON:  Portable chest x-ray of July 28, 2015 FINDINGS: The lungs are adequately inflated. The pulmonary interstitial markings are slightly less conspicuous overall today. The right hemidiaphragm is sharp. On the left the hemidiaphragm is now partially visible. The cardiac silhouette remains enlarged. The pulmonary vascularity is engorged but more distinct today. The mediastinum is normal in width. There has been interval extubation of the trachea and of the esophagus. The PICC line tip projects over the junction of the proximal and midportions of the SVC. IMPRESSION: Mild interval improvement in pulmonary interstitial edema secondary to CHF. Decreasing bilateral pleural effusions. Interval extubation of the trachea and esophagus. Electronically Signed   By: David  Martinique M.D.   On: 07/29/2015 07:26   Dg Chest Port 1 View  07/28/2015  CLINICAL DATA:  Ventilator dependent EXAM: PORTABLE CHEST 1 VIEW COMPARISON:  07/26/2015 FINDINGS: Cardiac shadow remains enlarged. An endotracheal tube, nasogastric catheter and right-sided PICC line are again identified and stable. Bilateral pleural effusions and vascular congestion with pulmonary edema are seen. The overall appearance is similar to that noted on the prior exam IMPRESSION: Changes of CHF with bilateral pleural effusions and parenchymal edema. Electronically Signed   By: Inez Catalina M.D.   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   RA on Humira  HCAP? CXR now interstitial edema/ CHF Candida on sputum Cx. Would defer treatment on this (some concern given he is on immunosuppressants)  CRI Stable  Would aim for 14 days of therapy.  WBC (near) normal afebrile for several days.  Available as needed.     Total days of antibiotics: 13 (vanco 13, merrem 4)         Bobby Rumpf Infectious Diseases (pager) [phone] [email or web address] 07/29/2015, 10:03 AM  LOS: 12 days

## 2015-07-29 NOTE — Progress Notes (Signed)
Pharmacy Antibiotic Note  Curtis Frazier is a 75 y.o. male admitted on 07/17/2015 with sepsis of unknown origin.  Pharmacy has been consulted for vancomycin and meropenem dosing.  Plan: Increase meropenem to 1 gm IV q8h Continue vancomycin 750 mg IV q24h Stop date in for tomorrow  Height: 5\' 8"  (172.7 cm) Weight: 210 lb 8.6 oz (95.5 kg) IBW/kg (Calculated) : 68.4  Temp (24hrs), Avg:98.4 F (36.9 C), Min:97.7 F (36.5 C), Max:99 F (37.2 C)   Recent Labs Lab 07/25/15 0522 07/25/15 2011 07/26/15 0422 07/27/15 0625 07/28/15 0832 07/29/15 0644  WBC 8.1  --  7.7 7.0 10.5 9.5  CREATININE 1.30* 1.45* 1.51* 1.49* 1.68* 1.28*  VANCOTROUGH  --   --  33*  --   --   --     Estimated Creatinine Clearance: 55 mL/min (by C-G formula based on Cr of 1.28).    Allergies  Allergen Reactions  . Ace Inhibitors Other (See Comments)    Causes high K  . Angiotensin Receptor Blockers Other (See Comments)    Causes high K  . Metoprolol Other (See Comments)    May cause elevated K level  . Penicillins Swelling and Rash  . Tetracycline Swelling and Rash    Antimicrobials this admission: Meropenem 2/13 >> (2/17) Vanc 2/4 >> (2/17) Azithromycin 2/9 >> 2/13 Ampicillin 2/4>>2/5 Ceftriaxone 2/4>>2/4 Cefepime 2/5 >> 2/13  Dose adjustments this admission: * 2/7 VT = 19 on 750 mg IV q12h * 2/13 VT 33 (drawn early), may be closer to 28 - decrease to 750 mg IV q24h  Microbiology results: 2/4 MRSA PCR Nasal Neg 2/4 BCx2: neg 2/4 UCx: mult species 2/8 PCP smear: neg 2/8 CMV pcr: neg 2/8 Fungal cx: ngtd 2/8 AFB cx: ngtd 2/8 RSV: negative 2/8 Bronch: ngtd 2/10 strep pneumo neg 2/10 legionella neg 2/8 Viral Panel Neg 2/12 BAL: few candida, neg  Thank you for allowing pharmacy to be a part of this patient's care.  Cassie L. Nicole Kindred, PharmD PGY2 Infectious Diseases Pharmacy Resident Pager: [phone] 07/29/2015 11:40 AM

## 2015-07-29 NOTE — Progress Notes (Signed)
Nutrition Follow-up  DOCUMENTATION CODES:   Obesity unspecified  INTERVENTION:    Start TF after Cortrak tube placed with Jevity 1.2 at 25 ml/h and Prostat 30 ml once daily. Increase by 20 ml every 4 hours to goal rate of 65 ml/h to provide 1972 kcals, 102 gm protein, 1264 ml free water daily.  NUTRITION DIAGNOSIS:   Inadequate oral intake related to inability to eat as evidenced by NPO status.  Ongoing  GOAL:   Patient will meet greater than or equal to 90% of their needs  Unmet  MONITOR:   TF tolerance, Diet advancement, Labs, Weight trends, I & O's   ASSESSMENT:   27 yom who is s/p recent lumbar surg on 2/2. Following d/c on 2/4 developed acute confusion and worsening weakness w/ fever > 102. He was brought to the ER. Admitted w/ working dx of sepsis felt d/t PNA +/- UTI. His hospital course was c/b intermittent fever, AF w/ RVR, hypoxia and confusion. Required transfer to ICU and intubation on 2/8.  S/P swallow evaluation with SLP this AM. Unable to safely begin PO diet at this time. Plans to place Cortrak tube for enteral access.  Diet Order:   NPO  Skin:  Reviewed, no issues  Last BM:  2/15  Height:   Ht Readings from Last 1 Encounters:  07/21/15 5\' 8"  (1.727 m)    Weight:   Wt Readings from Last 1 Encounters:  07/29/15 210 lb 8.6 oz (95.5 kg)    Ideal Body Weight:  70 kg  BMI:  Body mass index is 32.02 kg/(m^2).  Estimated Nutritional Needs:   Kcal:  1800-2000  Protein:  100-110 gm  Fluid:  1.8-2 L  EDUCATION NEEDS:   No education needs identified at this time  Molli Barrows, [place], [place], [place] Pager [phone] After Hours Pager [phone]

## 2015-07-29 NOTE — Progress Notes (Signed)
[facility] Progress Note Patient Name: Curtis Frazier DOB: 01-14-1941 MRN: [id number]   Date of Service  07/29/2015  HPI/Events of Note    eICU Interventions  Hypokalemia, repleted      Intervention Category Intermediate Interventions: Electrolyte abnormality - evaluation and management  Simonne Maffucci 07/29/2015, 6:09 PM

## 2015-07-29 NOTE — Care Management Note (Signed)
Case Management Note  Patient Details  Name: Curtis Frazier MRN: [id number] Date of Birth: 1941/04/16  Subjective/Objective:   Date: 07/20/15 Spoke with patient's son and daughter on 07/19/15, patient unable to give information, on nrb and not oriented . Introduced self as Tourist information centre manager and explained role in discharge planning and how to be reached.  Verified patient lives in [place],  with spouse and patient is wife's caregiver. All family members work , they will not be able to give full time supervision for patient or his wife.  NCM gave family resources for Adult Day Care ([facility]) , [facility] and a list of ALF's in [facility] (specifically for wife).   Expressed potential need for no other DME.  Disposition uncertain at that time, family interested in speaking with Palliative care. Patient has Medicare insurance Patient  is driven by family to MD appointments.  Patient has PCP Dr. Loura Pardon.   Plan: CM will continue to follow for discharge planning and [facility] resources.                  Action/Plan: 07/29/2015 Pt extubated yesterday 07/28/15, had required venturi mask since extubation.  Family at beside.  Pt remains on IV Amiodarone and Esmolol.    Palliative consulted.  Expected Discharge Date:                  Expected Discharge Plan:  [place]  In-House Referral:     Discharge planning Services  CM Consult  Post Acute Care Choice:    Choice offered to:     DME Arranged:    DME Agency:     HH Arranged:    [place] Agency:     Status of Service:  In process, will continue to follow  Medicare Important Message Given:  Yes Date Medicare IM Given:    Medicare IM give by:    Date Additional Medicare IM Given:    Additional Medicare Important Message give by:     If discussed at [place] of Stay Meetings, dates discussed:    Additional Comments: CM assessed pt with daughter and son in law at bedside.  Per daughter; between the six children pt will  have all the support and supervision needed at home but will be interested in [facility], and they will arrange for care for wife when needed so father can focus on healing at discharge.  Pt was completely independent before admit.  Pt remains intubated at this time and failed wean this am.  CM will continue to monitor for disposition needs Maryclare Labrador, RN 07/29/2015, 8:55 AM

## 2015-07-29 NOTE — Evaluation (Signed)
Clinical/Bedside Swallow Evaluation Patient Details  Name: Curtis Frazier MRN: [id number] Date of Birth: [birth year]  Today's Date: 07/29/2015 Time: SLP Start Time (ACUTE ONLY): 1210 SLP Stop Time (ACUTE ONLY): 1230 SLP Time Calculation (min) (ACUTE ONLY): 20 min  Past Medical History:  Past Medical History  Diagnosis Date  . Other and unspecified hyperlipidemia   . Coronary atherosclerosis of unspecified type of vessel, native or graft   . Personal history of colonic polyps   . Actinic keratosis   . 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 Other psoriasis   . Scoliosis (and kyphoscoliosis), idiopathic   . GI bleed 12/10    cecam AVM  . Gastritis 12/10  . Hyperlipidemia     takes Pravastatin daily  . Hypertension     takes Metoprolol daily  . Myocardial infarction ([place]) 2010  . Shortness of breath     with exertion  . Pneumonia     hx of-2008  . Headache(784.0)     occasionally  . Joint pain   . Joint swelling   . Rheumatoid arthritis(714.0)     takes Methotrexate 7pills weekly  . Degeneration of intervertebral disc, site unspecified   . Chronic back pain     scoliosis/stenosis/radiculopathy,degenerative disc disease  . Psoriasis   . Bruises easily   . Esophageal reflux     takes Omeprazole daily  . History of colonic polyps   . Hemorrhoids   . Kidney stone     hx of  . Blood transfusion 2010  . Type II or unspecified type diabetes mellitus without mention of complication, not stated as uncontrolled     type II;controlled by diet and exercise  . PMR (polymyalgia rheumatica) (HCC) 01/20/2012    tx by specialist on a long prednisone taper  . S/P PTCA (percutaneous transluminal coronary angioplasty)    Past Surgical History:  Past Surgical History  Procedure Laterality Date  . Rotator cuff repair Left 2000  . Colonoscopy    . Vasectomy  1979  . Coronary angioplasty with stent placement  02/2009    has one stent  . Carotid doppler  10/12    0-39% R and 60-79% left   .  Cataract extraction  2012  . Cardiac catheterization  2010  . Lithotripsy  2009   HPI:  28 yom who is s/p recent lumbar surg on 2/2. Following d/c on 2/4 developed acute confusion and worsening weakness w/ fever > 102. He was brought to the ER. Admitted w/ working dx of sepsis felt d/t PNA +/- UTI. His hospital course was c/b intermittent fever, AF w/ RVR, hypoxia and confusion. He was treated w/ IVFs and empiric abx. On 2/7 apparently had been doing better. Evalauted by SLP no signs of aspiration, kept on regualr diet, thin liquids with basic precautions. On 2/8 developed increased SOB, for which he was placed on NIPPV, this was followed again by fever and then worsening agitation. on 2/8 he was intubated after his agitation escalated to point he would no longer keep his BIPAP on, worsening CHF. Extubated 2/15.    Assessment / Plan / Recommendation Clinical Impression  Pt demonstrates signs of an acute reversible dysphagia following 8 day intubation, also complicated by signficant deconditioning and generalized weakness. SLP assisted pt with oral care and suction with verbal cues to cough to mobilize thick tan standing secretions retrieved from oropharynx with gag response. Pt orally manipulated ice chip, but was reluctant to attempt swallow response without max verbal cues. Hyolaryngeal mobility  subjectively severely weak. Given this presentation, recommend pt remain NPO with consideration for short term alternate means of nutrition as he will likely need several days to recover swallow funciton. SLP will provide ongoing therapy to facilitate safe PO intake.     Aspiration Risk  Severe aspiration risk    Diet Recommendation NPO;Alternative means - temporary   Medication Administration: Via alternative means    Other  Recommendations Other Recommendations: Have oral suction available   Follow up Recommendations  Inpatient Rehab    Frequency and Duration min 2x/week  2 weeks        Prognosis Prognosis for Safe Diet Advancement: Good      Swallow Study   General HPI: 13 yom who is s/p recent lumbar surg on 2/2. Following d/c on 2/4 developed acute confusion and worsening weakness w/ fever > 102. He was brought to the ER. Admitted w/ working dx of sepsis felt d/t PNA +/- UTI. His hospital course was c/b intermittent fever, AF w/ RVR, hypoxia and confusion. He was treated w/ IVFs and empiric abx. On 2/7 apparently had been doing better. Evalauted by SLP no signs of aspiration, kept on regualr diet, thin liquids with basic precautions. On 2/8 developed increased SOB, for which he was placed on NIPPV, this was followed again by fever and then worsening agitation. on 2/8 he was intubated after his agitation escalated to point he would no longer keep his BIPAP on, worsening CHF. Extubated 2/15.  Type of Study: Bedside Swallow Evaluation Previous Swallow Assessment: see HPI Diet Prior to this Study: NPO Temperature Spikes Noted: No Respiratory Status: Nasal cannula History of Recent Intubation: Yes Length of Intubations (days): 8 days Date extubated: 07/28/15 Behavior/Cognition: Requires cueing Oral Cavity Assessment: Dried secretions Oral Care Completed by SLP: Yes Oral Cavity - Dentition: Dentures, bottom;Adequate natural dentition (partial on bottom) Vision: Functional for self-feeding Self-Feeding Abilities: Needs assist Patient Positioning: Upright in bed Baseline Vocal Quality: Hoarse;Breathy;Low vocal intensity Volitional Cough: Weak Volitional Swallow: Unable to elicit    Oral/Motor/Sensory Function Overall Oral Motor/Sensory Function: Generalized oral weakness   Ice Chips Ice chips: Impaired Presentation: Spoon Oral Phase Impairments: Reduced lingual movement/coordination Oral Phase Functional Implications: Prolonged oral transit Pharyngeal Phase Impairments: Suspected delayed Swallow;Decreased hyoid-laryngeal movement   Thin Liquid Thin Liquid: Not tested     Nectar Thick Nectar Thick Liquid: Not tested   Honey Thick Honey Thick Liquid: Not tested   Puree Puree: Not tested   Solid   GO   Solid: Not tested       [name], MA CCC-SLP [id number]  Millena Callins, Katherene Ponto 07/29/2015,12:40 PM

## 2015-07-29 NOTE — Progress Notes (Signed)
Pt arrived to unit via ICU staff at 1900. CHG bath completed and family at bedside. Will continue to monitor.

## 2015-07-29 NOTE — Progress Notes (Signed)
Pt rec'd CPT X2.  Last one was late due to other treatment in unit it was between 2 and 3.  Will continue with Q4 this morning.

## 2015-07-29 NOTE — Progress Notes (Signed)
PULMONARY / CRITICAL CARE MEDICINE   Name: Curtis Frazier MRN: [id number] DOB: 1940/09/03    ADMISSION DATE:  07/17/2015 CONSULTATION DATE: 2/8  REFERRING MD:  Hongalgi   CHIEF COMPLAINT:  Acute respiratory failure and sepsis   HISTORY OF PRESENT ILLNESS:   34 yom who is s/p recent lumbar surg on 2/2. Following d/c on 2/4 developed acute confusion and worsening weakness w/ fever > 102. He was brought to the ER. Admitted w/ working dx of sepsis felt d/t PNA +/- UTI. His hospital course was c/b intermittent fever, AF w/ RVR, hypoxia and confusion. He was treated w/ IVFs and empiric abx. On 2/7 apparently had been doing better. On 2/8 developed increased SOB, for which he was placed on NIPPV, this was followed again by fever and then worsening agitation. He had significant hypoxia w/ P/F ratio of  77. PCCM was asked to assess after his agitation escalated to point he would no longer keep his BIPAP on.   SUBJECTIVE:  Extubated yesterday; on venturi mask. No overnight events Neg balance Requesting to put something in his mouth  VITAL SIGNS: BP 148/91 mmHg  Pulse 104  Temp(Src) 98 F (36.7 C) (Oral)  Resp 14  Ht 5\' 8"  (1.727 m)  Wt 210 lb 8.6 oz (95.5 kg)  BMI 32.02 kg/m2  SpO2 100%  HEMODYNAMICS:    VENTILATOR SETTINGS: Vent Mode:  [-]  FiO2 (%):  [50 %-55 %] 55 %  INTAKE / OUTPUT: I/O last 3 completed shifts: In: 3089.3 [I.V.:2079.3; NG/GT:410; IV [id number] Out: [id number] [Urine:3865; Emesis/NG output:200]  PHYSICAL EXAMINATION:  General:  Chronically ill appearing elderly white male, awake, responsive Neuro: awake and alert; follows commands HEENT: venturi mask Cardiovascular:  Irregularly irregular  Lungs:  reduced bilat, anterior clear Abdomen:  Soft, not tender. + bowel sounds  Musculoskeletal:  Equal st and bulk. Has trace edema Skin:  Warm, dry, intact.  LABS:  BMET  Recent Labs Lab 07/27/15 0625 07/28/15 0832 07/29/15 0644  NA 143 146* 153*  K 4.6 4.4 3.7   CL 108 111 116*  CO2 24 24 24   BUN 79* 96* 67*  CREATININE 1.49* 1.68* 1.28*  GLUCOSE 242* 153* 143*   Electrolytes  Recent Labs Lab 07/23/15 0448 07/24/15 0430  07/27/15 0625 07/28/15 0832 07/29/15 0644  CALCIUM 7.1* 7.2*  < > 7.3* 7.8* 8.0*  MG 2.4 2.3  --  2.1  --   --   PHOS 4.2 3.5  --  3.9  --   --   < > = values in this interval not displayed.  CBC  Recent Labs Lab 07/27/15 0625 07/28/15 0832 07/29/15 0644  WBC 7.0 10.5 9.5  HGB 8.4* 9.4* 9.2*  HCT 26.2* 29.5* 29.2*  PLT 303 311 294    Coag's  Recent Labs Lab 07/26/15 1624 07/27/15 0445 07/27/15 1057 07/28/15 0832  APTT >200* 26 23.7  --   INR 2.22* 1.25  --  1.19    Sepsis Markers  Recent Labs Lab 07/23/15 0448 07/24/15 1305 07/25/15 0522  PROCALCITON 7.58 3.87 3.02    ABG  Recent Labs Lab 07/25/15 1130 07/28/15 1027  PHART 7.274* 7.437  PCO2ART 47.2* 36.6  PO2ART 69.0* 55.2*    Liver Enzymes  Recent Labs Lab 07/26/15 0422 07/27/15 0625 07/28/15 0832  AST 24 96* 47*  ALT 29 77* 69*  ALKPHOS 85 156* 135*  BILITOT 0.3 0.5 0.6  ALBUMIN 1.9* 1.8* 2.3*    Cardiac Enzymes  Recent Labs Lab 07/26/15  0422  TROPONINI 0.16*    Glucose  Recent Labs Lab 07/28/15 1105 07/28/15 1532 07/28/15 1917 07/28/15 2347 07/29/15 0355 07/29/15 0746  GLUCAP 84 98 121* 85 102* 139*    Imaging Dg Chest Port 1 View  07/29/2015  CLINICAL DATA:  Respiratory failure, CHF, sepsis, acute encephalopathy EXAM: PORTABLE CHEST 1 VIEW COMPARISON:  Portable chest x-ray of July 28, 2015 FINDINGS: The lungs are adequately inflated. The pulmonary interstitial markings are slightly less conspicuous overall today. The right hemidiaphragm is sharp. On the left the hemidiaphragm is now partially visible. The cardiac silhouette remains enlarged. The pulmonary vascularity is engorged but more distinct today. The mediastinum is normal in width. There has been interval extubation of the trachea and of  the esophagus. The PICC line tip projects over the junction of the proximal and midportions of the SVC. IMPRESSION: Mild interval improvement in pulmonary interstitial edema secondary to CHF. Decreasing bilateral pleural effusions. Interval extubation of the trachea and esophagus. Electronically Signed   By: David  Martinique M.D.   On: 07/29/2015 07:26   STUDIES:  2/4: CT chest images showed no evidence of a pulmonary embolism about basilar atelectasis most likely. ECHO 2/7: EF 65-70%; indeterminate of diastolic fxn, Peak PAP 32 mmHg 2/8:MRI brain & spine>>> brain unremarkable; fluid collection in laminectomy (post-op?) 2/8: EEG >> abnormal; slowing seen consistent with encephalopathy. No epileptic activity  2/13 CT head/chest >> stable sinusitis; mastoiditis. Significant increase in pleural effusions and bilateral Consolidation 2/13 EEG>>severe generalized slowing of cerebral activity continuously which was nonspecific, and can be seen with metabolic and toxic encephalopathies as well as with degenerative nervous system disorders  CULTURES: BCX2 2/8>>> NGTD Respiratory culture 2/8, 2/12>>> neg PCP 2/8>>> negative RVP>> neg Fungus negative AFB negative  ANTIBIOTICS: vanc 2/4>>> last day tmrw Cefepime 2/5>>> 2/13 Azithromycin 2/9>>2/13 Meropenem 2/13 >> last day tmrw  SIGNIFICANT EVENTS: 2/2: lumbar surgery 2/4: Admitted for PNA 2/8: CCM, intubated, febrile, hypoxemia  2/13- INR elevated x 2 then wnl  LINES/TUBES: Oett 2/8>>> 2/15 PICC RUE 2/5>>> PIV Foley  DISCUSSION: 22 yom s/p recent lumbar surgery. Admitted 2/4 w/ delirium and working dx of sepsis in setting of PNA. Course c/b AF w/ RVR, intermittent fevers and respiratory failure in spite of ABX. PCCM asked to see on 2/8 for worsening agitation, respiratory failure and distress. Overall not improving since admission.   ASSESSMENT / PLAN:  PULMONARY A: Acute Hypoxic Respiratory Failure - improving Pulmonary infiltrates. -  improving D-dx include PNA/ evolving ALI vs Pulmonary edema. He is on immunosuppression at baseline so need to consider opportunistic infection.  Unimpressed PNA Did have obstructed ett thick old secretions P:   Extubated yesterday; on venturi mask Neuro resp drive Even balance goals Upright, chair IS Chest PT continue ABg no repeat  CARDIOVASCULAR A:  SIRS/Sepsis- resolved PAF w/ RVR- improved Trop elevation - likely 2/2 demand ischemia  P:  MAP goal >60, off pressors Cardiology following Continue amio drip and esmolol ( titrate esmolol to off) Consider add Cardizem to dc esmolol drip (BB allergy oral) Did well with dig, hope we can keep Dc heparin see heme Echo with normal EF  RENAL A:   CKD stage 3 w/ acute on chronic renal failure - improving off diuretics hypok - resolved Hypernatremia  P:   Hold Lasix; good UOP Neg balance 2L Chem daily kvo Add d5w at 75 cc/hr, bmet in pm  GASTROINTESTINAL A:   Bowel regimen P:   PPI for SUP dulx supp prn BM  on 2/15 SLP eval; then feed per recommendations vs place NGt and feed  HEMATOLOGIC A:   Anemia w/out evidence of blood loss - stable Chronic Thrombocytopenia  hemoconcetrated 2/13 Supra-therapeutic INR - resolved with FFP and Vit K P:  Trend CBC  scd mixing study - nml lft repeat wnl coags likely now all error, re add heparin (fib)  INFECTIOUS A:   Severe sepsis source unclear.  Also consider recent lumbar surgery fluid collection - most convinced of meningitis from tear No fevers x 4 days HCAP left base  ( vap) 2/13 P:   Per ID -14 days coarse  ENDOCRINE A:   DM w/ hyperglycemia on steriods - improved with TF coverage P:   SSI, NPH BID- hold of npo Stress steriods TF coverage  NEUROLOGIC A:   Acute Encephalopathy - improved Recent L spine surgery  Likely meningitis from tear P:   RASS goal: 0 LP holding as FC and coags   FAMILY  - Updates: to son at bedside and kids daily -  Inter-disciplinary family meet or Palliative Care meeting due by:  2/18   Luiz Blare, DO 07/29/2015, 8:42 AM PGY-2, [facility]  STAFF NOTE: I, Merrie Roof, MD FACP have personally reviewed patient's available data, including medical history, events of note, physical examination and test results as part of my evaluation. I have discussed with resident/NP and other care providers such as pharmacist, RN and RRT. In addition, I personally evaluated patient and elicited key findings of: awake, fc, upper airway sounds with strong cough, pcxr resolving, Chest pt, suctioning, add oral agent to dc esmolol, maintain dig, amio, allow neg balance on won i guess but add d5w for na, stop dates abx, likley will need NGT and feeds today, to goal 6 liters, updated family members, glu more controlled  Critical Care Time devoted to patient care services described in this note is 30 Minutes. This time reflects time of care of this signee: Merrie Roof, MD FACP. This critical care time does not reflect procedure time, or teaching time or supervisory time of PA/NP/Med student/Med Resident etc but could involve care discussion time. Rest per NP/medical resident whose note is outlined above and that I agree with  [name]. [name], MD, [place] Pgr: [facility] 07/29/2015 8:42 AM

## 2015-07-30 ENCOUNTER — Inpatient Hospital Stay (HOSPITAL_COMMUNITY): Payer: Medicare Other

## 2015-07-30 DIAGNOSIS — Z7189 Other specified counseling: Secondary | ICD-10-CM | POA: Insufficient documentation

## 2015-07-30 LAB — BASIC METABOLIC PANEL
Anion gap: 12 (ref 5–15)
BUN: 38 mg/dL — AB (ref 6–20)
CHLORIDE: 116 mmol/L — AB (ref 101–111)
CO2: 25 mmol/L (ref 22–32)
CREATININE: 1.2 mg/dL (ref 0.61–1.24)
Calcium: 7.7 mg/dL — ABNORMAL LOW (ref 8.9–10.3)
GFR, EST NON AFRICAN AMERICAN: 57 mL/min — AB (ref >60)
Glucose, Bld: 183 mg/dL — ABNORMAL HIGH (ref 65–99)
POTASSIUM: 3.1 mmol/L — AB (ref 3.5–5.1)
SODIUM: 153 mmol/L — AB (ref 135–145)

## 2015-07-30 LAB — CBC
HEMATOCRIT: 30.4 % — AB (ref 39.0–52.0)
Hemoglobin: 9.5 g/dL — ABNORMAL LOW (ref 13.0–17.0)
MCH: 31.3 pg (ref 26.0–34.0)
MCHC: 31.3 g/dL (ref 30.0–36.0)
MCV: 100 fL (ref 78.0–100.0)
PLATELETS: 323 10*3/uL (ref 150–400)
RBC: 3.04 MIL/uL — ABNORMAL LOW (ref 4.22–5.81)
RDW: 17.8 % — AB (ref 11.5–15.5)
WBC: 10.3 10*3/uL (ref 4.0–10.5)

## 2015-07-30 LAB — HEPARIN LEVEL (UNFRACTIONATED): Heparin Unfractionated: 0.69 IU/mL (ref 0.30–0.70)

## 2015-07-30 LAB — GLUCOSE, CAPILLARY
GLUCOSE-CAPILLARY: 111 mg/dL — AB (ref 65–99)
Glucose-Capillary: 165 mg/dL — ABNORMAL HIGH (ref 65–99)
Glucose-Capillary: 149 mg/dL — ABNORMAL HIGH (ref 65–99)
Glucose-Capillary: 135 mg/dL — ABNORMAL HIGH (ref 65–99)
Glucose-Capillary: 156 mg/dL — ABNORMAL HIGH (ref 65–99)
Glucose-Capillary: 155 mg/dL — ABNORMAL HIGH (ref 65–99)
Glucose-Capillary: 191 mg/dL — ABNORMAL HIGH (ref 65–99)

## 2015-07-30 LAB — MAGNESIUM: Magnesium: 2 mg/dL (ref 1.7–2.4)

## 2015-07-30 MED ORDER — FREE WATER
300.0000 mL | Freq: Three times a day (TID) | Status: DC
  Administered 2015-07-30 – 2015-07-31 (×3): 300 mL

## 2015-07-30 MED ORDER — ONDANSETRON HCL 4 MG/2ML IJ SOLN: 4.0000 mg | Freq: Four times a day (QID) | INTRAMUSCULAR | Status: DC | PRN

## 2015-07-30 MED ORDER — HYDROCORTISONE NA SUCCINATE PF 100 MG IJ SOLR
50.0000 mg | Freq: Two times a day (BID) | INTRAMUSCULAR | Status: DC
  Administered 2015-07-31 – 2015-08-03 (×6): 50 mg via INTRAVENOUS
  Filled 2015-07-30 (×6): qty 2

## 2015-07-30 MED ORDER — METOPROLOL TARTRATE 1 MG/ML IV SOLN
5.0000 mg | Freq: Four times a day (QID) | INTRAVENOUS | Status: DC
  Administered 2015-07-30 – 2015-07-31 (×4): 5 mg via INTRAVENOUS
  Filled 2015-07-30 (×4): qty 5

## 2015-07-30 MED ORDER — POTASSIUM CHLORIDE 10 MEQ/100ML IV SOLN
10.0000 meq | Freq: Once | INTRAVENOUS | Status: AC
  Administered 2015-07-30: 10 meq via INTRAVENOUS

## 2015-07-30 MED ORDER — AMIODARONE LOAD VIA INFUSION
150.0000 mg | Freq: Once | INTRAVENOUS | Status: AC
  Administered 2015-07-30: 150 mg via INTRAVENOUS
  Filled 2015-07-30: qty 83.34

## 2015-07-30 MED ORDER — AMIODARONE IV BOLUS ONLY 150 MG/100ML: 150.0000 mg | Freq: Once | INTRAVENOUS | Status: DC

## 2015-07-30 MED ORDER — HEPARIN (PORCINE) IN NACL 100-0.45 UNIT/ML-% IJ SOLN
1550.0000 [IU]/h | INTRAMUSCULAR | Status: DC
Start: 2015-07-30 — End: 2015-08-03
  Administered 2015-07-31: 1600 [IU]/h via INTRAVENOUS
  Administered 2015-07-31: 1650 [IU]/h via INTRAVENOUS
  Administered 2015-08-01 – 2015-08-03 (×4): 1550 [IU]/h via INTRAVENOUS
  Filled 2015-07-30 (×7): qty 250

## 2015-07-30 MED ORDER — POTASSIUM CHLORIDE 20 MEQ/15ML (10%) PO SOLN: 60.0000 meq | Freq: Three times a day (TID) | ORAL | Status: DC

## 2015-07-30 MED ORDER — AMIODARONE HCL IN DEXTROSE 360-4.14 MG/200ML-% IV SOLN
60.0000 mg/h | INTRAVENOUS | Status: AC
  Administered 2015-07-30 (×2): 60 mg/h via INTRAVENOUS
  Filled 2015-07-30: qty 200

## 2015-07-30 MED ORDER — POTASSIUM CHLORIDE 20 MEQ/15ML (10%) PO SOLN
60.0000 meq | Freq: Three times a day (TID) | ORAL | Status: AC
  Administered 2015-07-30 (×2): 60 meq
  Filled 2015-07-30 (×2): qty 45

## 2015-07-30 MED ORDER — AMIODARONE HCL IN DEXTROSE 360-4.14 MG/200ML-% IV SOLN
30.0000 mg/h | INTRAVENOUS | Status: AC
  Administered 2015-07-30 – 2015-07-31 (×3): 30 mg/h via INTRAVENOUS
  Filled 2015-07-30 (×2): qty 200

## 2015-07-30 MED ORDER — FUROSEMIDE 10 MG/ML IJ SOLN
60.0000 mg | Freq: Three times a day (TID) | INTRAMUSCULAR | Status: AC
  Administered 2015-07-30 (×2): 60 mg via INTRAVENOUS
  Filled 2015-07-30 (×2): qty 6

## 2015-07-30 NOTE — Progress Notes (Signed)
ANTICOAGULATION CONSULT NOTE - FOLLOW UP  Pharmacy Consult:  Heparin Indication: atrial fibrillation  Allergies  Allergen Reactions  . Ace Inhibitors Other (See Comments)    Causes high K  . Angiotensin Receptor Blockers Other (See Comments)    Causes high K  . Metoprolol Other (See Comments)    May cause elevated K level  . Penicillins Swelling and Rash  . Tetracycline Swelling and Rash    Patient Measurements: Height: 5\' 8"  (172.7 cm) Weight: 205 lb 7.5 oz (93.2 kg) IBW/kg (Calculated) : 68.4 Heparin Dosing Weight: 89 kg  Vital Signs: Temp: 98.3 F (36.8 C) (02/17 0800) Temp Source: Oral (02/17 0800) BP: 158/81 mmHg (02/17 0818) Pulse Rate: 131 (02/17 0818)  Labs:  Recent Labs  07/28/15 [id number] 07/29/15 0644 07/29/15 1732 07/29/15 2229 07/30/15 0636 07/30/15 1045  HGB 9.4* 9.2*  --   --  9.5*  --   HCT 29.5* 29.2*  --   --  30.4*  --   PLT 311 294  --   --  323  --   LABPROT 15.3*  --   --   --   --   --   INR 1.19  --   --   --   --   --   HEPARINUNFRC  --   --   --  0.68 0.69  --   CREATININE 1.68* 1.28* 1.24  --   --  1.20    Estimated Creatinine Clearance: 58 mL/min (by C-G formula based on Cr of 1.2).    Assessment: 30 YOM continues on IV heparin for new-onset Afib.  Heparin level is therapeutic and toward the high end of normal.  No bleeding reported.   Goal of Therapy:  Heparin level 0.3-0.7 units/ml Monitor platelets by anticoagulation protocol: Yes    Plan:  - Decrease heparin gtt slightly to 1650 units/hr - Daily HL / CBC - F/U long-term AC plan - F/U KCL supplementation and start free water for hypernatremia    Thomasina Housley D. Mina Marble, PharmD, BCPS Pager:  [phone] 07/30/2015, 12:28 PM

## 2015-07-30 NOTE — Progress Notes (Signed)
Pt Profile: 75 yo who is s/p recent lumbar surgery on 2/2. Following d/c on 2/4 developed acute confusion and worsening weakness w/ fever > 102. He was brought to the ER 07/17/15 and admitted w/ working dx of sepsis felt due to PNA. Course complicated by shock, respiratory failure requiring intubation, and atrial fibrillation with RVR. He is now extubated.   Subjective: Resting, no pain. Pt's NG was out then not able to use all night.  Objective: Vital signs in last 24 hours: Temp:  [97.5 F (36.4 C)-99.1 F (37.3 C)] 97.7 F (36.5 C) (02/17 0400) Pulse Rate:  [89-131] 125 (02/17 0400) Resp:  [9-21] 15 (02/17 0400) BP: (140-162)/(84-106) 157/98 mmHg (02/17 0400) SpO2:  [92 %-99 %] 94 % (02/17 0400) FiO2 (%):  [50 %] 50 % (02/17 0208) Weight:  [205 lb 7.5 oz (93.2 kg)] 205 lb 7.5 oz (93.2 kg) (02/17 0250) Weight change: -5 lb 1.1 oz (-2.3 kg) Last BM Date: 07/28/15 Intake/Output from previous day: +1174 02/16 0701 - 02/17 0700 In: 2558 [I.V.:1908; IV Piggyback:650] Out: 1350 [Urine:1350] Intake/Output this shift:    PE: General:Pleasant affect, NAD-drifts to sleep easily Skin:Warm and dry, brisk capillary refill HEENT:normocephalic, sclera clear, mucus membranes moist Neck:supple, no JVD, no bruits  Heart:irreg irreg rapid without murmur, gallup, rub or click Lungs:clear, ant with congested cough- just had nebulizer without rales, rhonchi, or wheezes [id number], non tender, + BS, do not palpate liver spleen or masses Ext:1+ lower ext edema, 2+ pedal pulses, 2+ radial pulses Neuro:alert and oriented, MAE, follows commands, + facial symmetry Tele:  A fib with RVR 120-140    Lab Results:  Recent Labs  07/29/15 0644 07/30/15 0636  WBC 9.5 10.3  HGB 9.2* 9.5*  HCT 29.2* 30.4*  PLT 294 323   BMET  Recent Labs  07/29/15 0644 07/29/15 1732  NA 153* 155*  K 3.7 3.1*  CL 116* 115*  CO2 24 26  GLUCOSE 143* 127*  BUN 67* 55*  CREATININE 1.28* 1.24  CALCIUM  8.0* 8.1*   No results for input(s): TROPONINI in the last 72 hours.  Invalid input(s): CK, MB  Lab Results  Component Value Date   CHOL 240* 05/24/2015   HDL 56.80 05/24/2015   LDLCALC 154* 05/24/2015   LDLDIRECT 131.3 06/28/2012   TRIG 123 07/27/2015   CHOLHDL 4 05/24/2015   Lab Results  Component Value Date   HGBA1C 7.4* 07/18/2015     Lab Results  Component Value Date   TSH 0.678 07/18/2015    Hepatic Function Panel  Recent Labs  07/28/15 0832  PROT 6.6  ALBUMIN 2.3*  AST 47*  ALT 69*  ALKPHOS 135*  BILITOT 0.6   No results for input(s): CHOL in the last 72 hours. No results for input(s): PROTIME in the last 72 hours.     Studies/Results: Dg Chest Port 1 View  07/29/2015  CLINICAL DATA:  Respiratory failure, CHF, sepsis, acute encephalopathy EXAM: PORTABLE CHEST 1 VIEW COMPARISON:  Portable chest x-ray of July 28, 2015 FINDINGS: The lungs are adequately inflated. The pulmonary interstitial markings are slightly less conspicuous overall today. The right hemidiaphragm is sharp. On the left the hemidiaphragm is now partially visible. The cardiac silhouette remains enlarged. The pulmonary vascularity is engorged but more distinct today. The mediastinum is normal in width. There has been interval extubation of the trachea and of the esophagus. The PICC line tip projects over the junction of the proximal and midportions of the SVC.  IMPRESSION: Mild interval improvement in pulmonary interstitial edema secondary to CHF. Decreasing bilateral pleural effusions. Interval extubation of the trachea and esophagus. Electronically Signed   By: David  Martinique M.D.   On: 07/29/2015 07:26   Dg Abd Portable 1v  07/30/2015  CLINICAL DATA:  NG tube placement EXAM: PORTABLE ABDOMEN - 1 VIEW COMPARISON:  Portable abdominal film of July 29, 2015 FINDINGS: The feeding tube is been removed and a nasogastric tube placed. However, the proximal port is at or above the GE junction and the  tip lies in the cardia. Advancement by 15-20 cm at least is recommended. There is persistent left lower lobe atelectasis or pneumonia. The gas pattern in the upper abdomen is within the limits of normal. Metallic posterior fusion is devices are present from the lower thoracic spine throughout the lumbar spine. IMPRESSION: Advancement of the nasogastric tube by between 15 and 20 cm is needed to assure that the proximal port is positioned below the GE junction. Left lower lobe atelectasis or pneumonia. Electronically Signed   By: David  Martinique M.D.   On: 07/30/2015 08:01   Dg Abd Portable 1v  07/29/2015  CLINICAL DATA:  Encounter for feeding tube placement. EXAM: PORTABLE ABDOMEN - 1 VIEW COMPARISON:  None. FINDINGS: The bowel gas pattern is normal. Status post surgical fusion of lower thoracic and lumbar spine. Distal tip of feeding tube is seen in expected position of second portion of duodenum. IMPRESSION: Distal tip of feeding tube seen in expected position of second portion of duodenum. Electronically Signed   By: Marijo Conception, M.D.   On: 07/29/2015 15:08   ECHO: Echo 07/20/15 Study Conclusions  - Left ventricle: The cavity size was normal. Wall thickness was normal. Systolic function was vigorous. The estimated ejection fraction was in the range of 65% to 70%. Indeterminant diastolic function. Although no diagnostic regional wall motion abnormality was identified, this possibility cannot be completely excluded on the basis of this study. - Aortic valve: Moderately calcified leaflets. There was no stenosis. - Mitral valve: Mildly calcified annulus. There was trivial regurgitation. - Right ventricle: The cavity size was normal. Systolic function was normal. - Tricuspid valve: Peak RV-RA gradient (S): 29 mm Hg. - Pulmonary arteries: PA peak pressure: 32 mm Hg (S). - Inferior vena cava: The vessel was normal in size. The respirophasic diameter changes were in the normal  range (>= 50%), consistent with normal central venous pressure.  Impressions:  - Technically difficult study with poor acoustic windows. Normal LV size with EF 65-70%. Normal RV size and systolic function. No significant valvular abnormalities.  Medications: I have reviewed the patient's current medications. Scheduled Meds: . antiseptic oral rinse  7 mL Mouth Rinse QID  . chlorhexidine gluconate  15 mL Mouth Rinse BID  . digoxin  0.125 mg Intravenous Daily  . diltiazem  30 mg Oral 4 times per day  . feeding supplement (PRO-STAT SUGAR FREE 64)  30 mL Per Tube Daily  . folic acid  1 mg Per Tube Daily  . hydrocortisone sod succinate (SOLU-CORTEF) inj  50 mg Intravenous Q6H  . insulin aspart  0-20 Units Subcutaneous 6 times per day  . insulin aspart  3 Units Subcutaneous 6 times per day  . insulin NPH Human  15 Units Subcutaneous BID AC & HS  . levalbuterol  0.63 mg Nebulization Q6H  . meropenem (MERREM) IV  1 g Intravenous 3 times per day  . pantoprazole sodium  40 mg Per Tube Daily  .  polyethylene glycol  17 g Per Tube Daily  . sodium chloride flush  10-40 mL Intracatheter Q12H   Continuous Infusions: . amiodarone 30 mg/hr (07/30/15 0255)  . dextrose 75 mL/hr at 07/29/15 1221  . esmolol Stopped (07/29/15 1655)  . feeding supplement (JEVITY 1.2 CAL)    . heparin 1,700 Units/hr (07/29/15 2356)  . phenylephrine (NEO-SYNEPHRINE) Adult infusion Stopped (07/28/15 0023)   PRN Meds:.bisacodyl, docusate, fentaNYL (SUBLIMAZE) injection, levalbuterol, sodium chloride flush  Assessment/Plan: Principal Problem:   Sepsis ([place]) Active Problems:   Essential hypertension, benign   Coronary atherosclerosis   Rheumatoid arthritis (HCC)   Acute encephalopathy   LLL pneumonia   Type II or unspecified type diabetes mellitus without mention of complication, not stated as uncontrolled   S/P lumbar laminectomy   Elevated troponin   Acute respiratory failure with hypoxia (HCC)   Acute  pulmonary edema (HCC)   HCAP (healthcare-associated pneumonia)   Palliative care encounter   DNR (do not resuscitate) discussion   Acute respiratory failure ([place])   Endotracheally intubated   CHF (congestive heart failure) ([place])   Change in mental state   Advanced care planning/counseling discussion   Pneumonia   PAF (paroxysmal atrial fibrillation) ([place])    1. Acute hypoxemic respiratory failure: HCAP in immunocompromised host (RA meds). On broad spectrum antibiotics per ID, CCM. Extubated still with poor inspiratory effort and apiration Risk due to poor MS. CXR with volume likely ARDS given normal EF by echo . Despite marked azotemia would give iv bid lasix Will let CCM decide  2. Atrial fibrillation with RVR: Echo with normal EF, on amiodarone,  and now digoxin iv . Hx of past PAF as well. Not on anticoagulation prior to admission.  --during evening back in a fib RVR and rc'd bolus IV amiodarone.  Pt only rec'd one dose of diltiazem and BB was D/c'd.  On Dig 0.125 daily IV,  With NG out most of night did not receive meds.  Have increased amiodarone to 1 mg/min for 12 hours and made BB IV 5 mg every 6 hours.  May need to increase.  Currently no po meds.    3. Hypotension: Suspect septic shock. per primary team   4. CAD: known h/o CAD-s/p CFX BMS 2010, Myoview low risk Nov 2016, no active ischemia   5. CRI stage 3--BMP Pending today  6. Hx of PMR requiring chronic steroids  7. Hx of GI bleed 2014- AVMs    LOS: 13 days   Time spent with pt. : 15 minutes. [facility]  Nurse Practitioner Certified Pager [phone] or after 5pm and on weekends call [phone] 07/30/2015, 8:27 AM  Patient examined chart reviewed.  PO intake has not been reliable BP is better and he is off pressors So can consider adding iv cardizem if rate control gets to be an issue  Anticoagulation being held Due to comorbidities and history of GI bleed with AVM;s  Jenkins Rouge

## 2015-07-30 NOTE — Progress Notes (Signed)
SLP Cancellation Note  Patient Details Name: Curtis Frazier MRN: [id number] DOB: [birth year]   Cancelled treatment:        Per RN report, pt not medically ready to resume PO intake. Will likely f/u next date.   [name] 07/30/2015, 1:13 PM   [name], Student-SLP

## 2015-07-30 NOTE — Progress Notes (Signed)
[facility] Progress Note Patient Name: Curtis Frazier DOB: 12-30-1940 MRN: [id number]   Date of Service  07/30/2015  HPI/Events of Note  AFIB with RVR - ventricular rate 120's to 140's. Patient is already on Amiodarone PO.   eICU Interventions  Will order: 1. Amiodarone IV bolus X 1 now. 2. BMP and Mg++ level at 5 AM.      Intervention Category Major Interventions: Arrhythmia - evaluation and management  Sommer,Steven Eugene 07/30/2015, 2:12 AM

## 2015-07-30 NOTE — Progress Notes (Addendum)
Daily Progress Note   Patient Name: Curtis Frazier       Date: 07/30/2015 DOB: 17-Sep-1940  Age: 75 y.o. MRN#: [id number] Attending Physician: Verlee Monte, MD Primary Care Physician: Loura Pardon, MD Admit Date: 07/17/2015    75 yo male with RA on immunosuppressive medications, DM, CAD, DDD, who is s/p recent lumbar surgery on 2/2. Following d/c on 2/4 developed acute confusion and worsening weakness w/ fever. He was brought to the ER. Admitted w/ working dx of sepsis felt due to HCAP. Course complicated by shock and atrial fibrillation with RVR. Intubated 2/8 - 2/15.  On 2/17 Curtis Frazier is out of the ICU but still having great difficulties with atrial fibrillation with RVR, secretions, respiratory status and an inability to swallow safely, and weeping extremities. Cards and ID are on board as well.  Reason for Consultation/Follow-up: Establishing goals of care  Subjective:  Patient is weak, with a Ventimask on-he does not speak.  5 family members are at bedside.  I answered all of their questions. I related that I was still very concerned about Curtis Frazier we will expect improvements in his heart rate, respiratory status, and his ability to swallow in the next several days.  If these do not happen we will have an additional goals of care conversation.  His daughter in law prompted me to tell his wife it was best not to smoke at the house around him. I complied.  His daughters Curtis Frazier and Curtis Frazier expressed that [place] had been calling their mother.  This was unwelcome as their mother was under enough stress and did not know what to do with these calls.  I assured that family that we are not at a decision making point yet.  We need to see how he progresses before we can decide on a discharge strategy.  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  They relay that their father had always told them he wanted "everything done'.  Of note:  Family expressed several concerns in addition to the stressful LTACH solicitation calls including:  Need for NT suctioning, consideration of transfer to [facility], frequency of nursing checks.  I spoke with LTACH and respiratory as well as the floor Comptroller and the RN.  This family is under  significant stress and will require excellent and consistent communication from the medical staff here at [facility].   Length of Stay: 13 days  Current Medications: Scheduled Meds:  . antiseptic oral rinse  7 mL Mouth Rinse QID  . chlorhexidine gluconate  15 mL Mouth Rinse BID  . digoxin  0.125 mg Intravenous Daily  . diltiazem  30 mg Oral 4 times per day  . feeding supplement (PRO-STAT SUGAR FREE 64)  30 mL Per Tube Daily  . folic acid  1 mg Per Tube Daily  . hydrocortisone sod succinate (SOLU-CORTEF) inj  50 mg Intravenous Q6H  . insulin aspart  0-20 Units Subcutaneous 6 times per day  . insulin aspart  3 Units Subcutaneous 6 times per day  . insulin NPH Human  15 Units Subcutaneous BID AC & HS  . levalbuterol  0.63 mg Nebulization Q6H  . meropenem (MERREM) IV  1 g Intravenous 3 times per day  . metoprolol  5 mg Intravenous 4 times per day  . pantoprazole sodium  40 mg Per Tube Daily  . polyethylene glycol  17 g Per Tube Daily  . sodium chloride flush  10-40 mL Intracatheter Q12H    Continuous Infusions: . amiodarone 60 mg/hr (07/30/15 0900)  . amiodarone 30 mg/hr (07/30/15 1235)  . dextrose 75 mL/hr at 07/29/15 1221  . esmolol Stopped (07/29/15 1655)  . feeding supplement (JEVITY 1.2 CAL) 20 mL/hr (07/30/15 1135)  . heparin 1,650 Units/hr (07/30/15 1249)  . phenylephrine (NEO-SYNEPHRINE) Adult infusion Stopped (07/28/15 0023)    PRN Meds: bisacodyl, docusate, fentaNYL (SUBLIMAZE) injection, levalbuterol, sodium chloride  flush  Physical Exam              Wd, wn elderly male, NAD, venti mask in place, weak but productive cough.  N/G in place as well with feeding temporarily paused. CV RRR Resp:  Coarse breath sounds bilaterally.  Weak cough Extremities:  2+ edema in all four extremities.  Weeping of the arms   Vital Signs: BP 146/85 mmHg  Pulse 194  Temp(Src) 99.3 F (37.4 C) (Oral)  Resp 13  Ht 5\' 8"  (1.727 m)  Wt 93.2 kg (205 lb 7.5 oz)  BMI 31.25 kg/m2  SpO2 79% SpO2: SpO2: (!) 79 % O2 Device: O2 Device: Venturi Mask O2 Flow Rate: O2 Flow Rate (L/min): 12 L/min  Intake/output summary:   Intake/Output Summary (Last 24 hours) at 07/30/15 1340 Last data filed at 07/30/15 1024  Gross per 24 hour  Intake 2070.43 ml  Output   2475 ml  Net -404.57 ml   LBM: Last BM Date: 07/28/15 Baseline Weight: Weight: 83.915 kg (185 lb) Most recent weight: Weight: 93.2 kg (205 lb 7.5 oz)       Palliative Assessment/Data: Flowsheet Rows        Most Recent Value   Intake Tab    Referral Department  Hospitalist   Unit at Time of Referral  Intermediate Care Unit   Palliative Care Primary Diagnosis  Cardiac   Date Notified  07/19/15   Palliative Care Type  New Palliative care   Reason for referral  Clarify Goals of Care   Date of Admission  07/17/15   Date first seen by Palliative Care  07/20/15   # of days Palliative referral response time  1 Day(s)   # of days IP prior to Palliative referral  2   Clinical Assessment    Psychosocial & Spiritual Assessment    Palliative Care Outcomes  Additional Data Reviewed: CBC    Component Value Date/Time   WBC 10.3 07/30/2015 0636   RBC 3.04* 07/30/2015 0636   RBC 3.52* 12/19/2013 0819   HGB 9.5* 07/30/2015 0636   HCT 30.4* 07/30/2015 0636   PLT 323 07/30/2015 0636   MCV 100.0 07/30/2015 0636   MCH 31.3 07/30/2015 0636   MCHC 31.3 07/30/2015 0636   RDW 17.8* 07/30/2015 0636   LYMPHSABS 1.2 07/26/2015 0422   MONOABS 1.1* 07/26/2015 0422    EOSABS 0.3 07/26/2015 0422   BASOSABS 0.0 07/26/2015 0422    CMP     Component Value Date/Time   NA 153* 07/30/2015 1045   K 3.1* 07/30/2015 1045   CL 116* 07/30/2015 1045   CO2 25 07/30/2015 1045   GLUCOSE 183* 07/30/2015 1045   BUN 38* 07/30/2015 1045   CREATININE 1.20 07/30/2015 1045   CALCIUM 7.7* 07/30/2015 1045   PROT 6.6 07/28/2015 0832   ALBUMIN 2.3* 07/28/2015 0832   AST 47* 07/28/2015 0832   ALT 69* 07/28/2015 0832   ALKPHOS 135* 07/28/2015 0832   BILITOT 0.6 07/28/2015 0832   GFRNONAA 57* 07/30/2015 1045   GFRAA >60 07/30/2015 1045       Problem List:  Patient Active Problem List   Diagnosis Date Noted  . PAF (paroxysmal atrial fibrillation) ([place]) 07/29/2015  . Advanced care planning/counseling discussion   . Pneumonia   . Change in mental state   . Endotracheally intubated   . CHF (congestive heart failure) ([place])   . Palliative care encounter 07/21/2015  . DNR (do not resuscitate) discussion 07/21/2015  . HCAP (healthcare-associated pneumonia)   . Acute respiratory failure ([place])   . Acute respiratory failure with hypoxia ([place]) 07/18/2015  . Atrial fibrillation with RVR ([place]) 07/18/2015  . Acute pulmonary edema ([place]) 07/18/2015  . Sepsis ([place]) 07/17/2015  . Acute encephalopathy 07/17/2015  . LLL pneumonia 07/17/2015  . Type II or unspecified type diabetes mellitus without mention of complication, not stated as uncontrolled 07/17/2015  . S/P lumbar laminectomy 07/17/2015  . Elevated troponin 07/17/2015  . Type 2 diabetes mellitus without complication, without long-term current use of insulin ([place])   . Acute bronchitis 03/05/2015  . Back pain, thoracic 01/15/2015  . Chest wall pain 01/15/2015  . Routine general medical examination at a health care facility 11/23/2014  . Productive cough 10/14/2014  . Left flank pain 09/25/2014  . Renal insufficiency 11/14/2013  . Encounter for Medicare annual wellness exam 07/07/2013  . BPH (benign prostatic  hyperplasia) 07/05/2012  . Prostate cancer screening 06/27/2012  . Low TSH level 01/22/2012  . PMR (polymyalgia rheumatica) (H[facility]) 01/20/2012  . Aortic stenosis 07/08/2011  . New onset a-fib ([place]) 07/05/2011    Class: Acute  . HYPERKALEMIA 02/18/2010  . Essential hypertension, benign 09/13/2009  . ANEMIA-UNSPECIFIED 06/10/2009  . ANGIODYSPLASIA-INTESTINE 06/10/2009  . MURMUR 05/21/2009  . Left carotid bruit 05/21/2009  . Coronary atherosclerosis 03/09/2009  . GERD 03/09/2009  . Rheumatoid arthritis ([place]) 04/29/2008  . HX, PERSONAL, COLONIC POLYPS 01/17/2007  . Diabetes mellitus type 2, controlled, without complications ([place]) [id number]  . Elevated lipids 09/18/2006  . PSORIATIC ARTHRITIS 09/18/2006  . PSORIASIS 09/18/2006  . 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 DEGENERATIVE DISC DISEASE 09/18/2006  . SCOLIOSIS 09/18/2006  . TOBACCO ABUSE, HX OF 09/18/2006     Palliative Care Assessment & Plan    1.Code Status:  Full code  2. Goals of Care/Additional Recommendations:   Limitations on Scope of Treatment: Full Comfort Care and Full  Scope Treatment   Excellent family support.    3. Prognosis: Unable to determine   Care plan was discussed with Wife - Curtis Frazier, Son- Curtis Frazier, Daughter Curtis Frazier, Daughter Curtis Frazier, Daughter in law - Curtis Frazier.  Thank you for allowing the Palliative Medicine Team to assist in the care of this patient.   Time In: 12:00 Time Out: 12:45 Total Time 45 min Prolonged Time Billed no        Imogene Burn, [place] [facility] Pager: [phone]  07/30/2015, 1:40 PM  Please contact Palliative Medicine Team phone at [phone] for questions and concerns.

## 2015-07-30 NOTE — Progress Notes (Signed)
PT Cancellation Note  Patient Details Name: Curtis Frazier MRN: [id number] DOB: November 30, 1940   Cancelled Treatment:    Reason Eval/Treat Not Completed: Medical issues which prohibited therapy (Nursing asked to HOLD to get HR/afib under control.  )Thanks.     Irwin Brakeman F 07/30/2015, 10:12 AM  [facility] [phone] (pager)

## 2015-07-30 NOTE — Care Management Note (Addendum)
Case Management Note  Patient Details  Name: Curtis Frazier MRN: [id number] Date of Birth: May 06, 1941  Subjective/Objective:   Date: 07/20/15 Spoke with patient's son and daughter on 07/19/15, patient unable to give information, on nrb and not oriented . Introduced self as Tourist information centre manager and explained role in discharge planning and how to be reached.  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 All family members work , they will not be able to give full time supervision for patient or his wife.  NCM gave family resources for Adult Day Care ([facility]) , [facility] and a list of ALF's in [facility] (specifically for wife).   Expressed potential need for no other DME.  Disposition uncertain at that time, family interested in speaking with Palliative care. Patient has Medicare insurance Patient  is driven by family to MD appointments.  Patient has PCP Dr. Loura Pardon.   Plan: CM will continue to follow for discharge planning and [facility] resources.                  Action/Plan: 07/30/2015  CM spoke with wife via phone; wife is in agreement with LTACH, wife request that both [facility] contact her to provide information on services, CM relayed request to liaisons.  Pt is being considered for LTACH placement, deemed appropriate per attending and physician advisor, order has been written and both [facility] and [facility] have been made aware of pending referral.  CM spoke with son at bedside last night, son extremely receptive of LTACH and was told he would discuss LTACH with family and would inform CM 2/17 if that is an acceptable discharge.  Pt extubated yesterday 07/28/15, had required venturi mask since extubation.  Family at beside.  Pt remains on IV Amiodarone and Esmolol.    Palliative consulted.  Expected Discharge Date:                  Expected Discharge Plan:  [place]  In-House Referral:     Discharge planning Services  CM  Consult  Post Acute Care Choice:    Choice offered to:     DME Arranged:    DME Agency:     HH Arranged:    [place] Agency:     Status of Service:  In process, will continue to follow  Medicare Important Message Given:  Yes Date Medicare IM Given:    Medicare IM give by:    Date Additional Medicare IM Given:    Additional Medicare Important Message give by:     If discussed at [place] of Stay Meetings, dates discussed:    Additional Comments: CM assessed pt with daughter and son in law at bedside.  Per daughter; between the six children pt will have all the support and supervision needed at home but will be interested in [facility], and they will arrange for care for wife when needed so father can focus on healing at discharge.  Pt was completely independent before admit.  Pt remains intubated at this time and failed wean this am.  CM will continue to monitor for disposition needs Maryclare Labrador, RN 07/30/2015, 8:11 AM

## 2015-07-30 NOTE — Progress Notes (Signed)
[place] for heparin Indication: atrial fibrillation  Allergies  Allergen Reactions  . Ace Inhibitors Other (See Comments)    Causes high K  . Angiotensin Receptor Blockers Other (See Comments)    Causes high K  . Metoprolol Other (See Comments)    May cause elevated K level  . Penicillins Swelling and Rash  . Tetracycline Swelling and Rash    Patient Measurements: Height: 5\' 8"  (172.7 cm) Weight: 210 lb 8.6 oz (95.5 kg) IBW/kg (Calculated) : 68.4 Heparin Dosing Weight: 88.5 kg  Vital Signs: Temp: 97.5 F (36.4 C) (02/16 2027) Temp Source: Oral (02/16 2027) BP: 154/95 mmHg (02/16 2027) Pulse Rate: 131 (02/16 2027)  Labs:  Recent Labs  07/27/15 0445  07/27/15 0625 07/27/15 1057 07/28/15 0832 07/29/15 0644 07/29/15 1732 07/29/15 2229  HGB  --   < > 8.4*  --  9.4* 9.2*  --   --   HCT  --   --  26.2*  --  29.5* 29.2*  --   --   PLT  --   --  303  --  311 294  --   --   APTT 26  --   --  23.7  --   --   --   --   LABPROT 15.9*  --   --   --  15.3*  --   --   --   INR 1.25  --   --   --  1.19  --   --   --   HEPARINUNFRC  --   --   --   --   --   --   --  0.68  CREATININE  --   < > 1.49*  --  1.68* 1.28* 1.24  --   < > = values in this interval not displayed.  Estimated Creatinine Clearance: 56.8 mL/min (by C-G formula based on Cr of 1.24).  Assessment: 75 yo male with Afib for heparin  Goal of Therapy:  Heparin level 0.3-0.7 units/ml Monitor platelets by anticoagulation protocol: Yes   Plan:  Continue Heparin at current rate  Follow-up am labs.  Phillis Knack, PharmD, BCPS  07/30/2015 12:22 AM

## 2015-07-30 NOTE — Procedures (Signed)
Pt NTS.  Moderate amount of thick, yellow secretions obtain from pt's left nare.

## 2015-07-30 NOTE — Progress Notes (Signed)
[facility] PROGRESS NOTE   Curtis Frazier [id number] DOB: Aug 18, 1940 DOA: 07/17/2015 PCP: Loura Pardon, MD  Subjective: Follow simple commands, NG tube pulled last, placed again .  HPI: 75 yo who is s/p recent lumbar surgery on 2/2. Following d/c on 2/4 developed acute confusion and worsening weakness w/ fever > 102. He was brought to the ER 07/17/15 and admitted w/ working dx of sepsis felt due to PNA. Course complicated by shock, respiratory failure requiring intubation, and atrial fibrillation with RVR. Extubated on 07/28/2015.  Assessment/Plan: Principal Problem:   Sepsis ([place]) Active Problems:   Essential hypertension, benign   Coronary atherosclerosis   Rheumatoid arthritis (HCC)   Acute encephalopathy   LLL pneumonia   Type II or unspecified type diabetes mellitus without mention of complication, not stated as uncontrolled   S/P lumbar laminectomy   Elevated troponin   Acute respiratory failure with hypoxia (HCC)   Acute pulmonary edema (HCC)   HCAP (healthcare-associated pneumonia)   Palliative care encounter   DNR (do not resuscitate) discussion   Acute respiratory failure ([place])   Endotracheally intubated   CHF (congestive heart failure) ([place])   Change in mental state   Advanced care planning/counseling discussion   Pneumonia   PAF (paroxysmal atrial fibrillation) ([place])    Acute hypoxic respiratory failure Acute respiratory failure with hypoxia, ventilator dependent. Patient extubated on 07/28/2015. Patient was under PCCM until 2/16 transferred to [facility] for further evaluation. Currently on Ventimask.  Sepsis Evident on admission, likely secondary to left lower lobe pneumonia. Patient has a recent lumbar laminectomy, MR of the brain showed no evidence of infection. He has some fluid collection likely postoperative seroma, radiology finding consistent with arachnoiditis.   Acute on chronic CHF Patient has acute CHF with preserved LVEF of 65%. Still has  some fluid overload, continue Lasix. Last chest x-ray from 2/16 showed improvement of interstitial pulmonary edema.  Atrial fibrillation with rapid ventricular response Appreciate cardiology consultation, patient is on amiodarone, diltiazem, digoxin and metoprolol as needed. Rate is not controlled. On heparin drip.  LLL pneumonia  Currently on antibiotics. Continue deep suctioned to remove secretions.  Hypernatremia Discontinue D5W and start free water flushes.  Hypokalemia Replete with oral supplements, magnesium is okay at 2.0.    Code Status: Full Code Family Communication: Plan discussed with the patient. Disposition Plan: Remains inpatient Diet:    Consultants:  Patient transferred to [facility] service on 07/30/2015  Procedures:  None  Antibiotics:  Meropenem and vancomycin   Objective: Filed Vitals:   07/30/15 0818 07/30/15 1301  BP: 158/81 146/85  Pulse: 131 194  Temp:  99.3 F (37.4 C)  Resp: 15 13    Intake/Output Summary (Last 24 hours) at 07/30/15 1329 Last data filed at 07/30/15 1024  Gross per 24 hour  Intake 2070.43 ml  Output   2475 ml  Net -404.57 ml   Filed Weights   07/28/15 0500 07/29/15 0404 07/30/15 0250  Weight: 98.3 kg (216 lb 11.4 oz) 95.5 kg (210 lb 8.6 oz) 93.2 kg (205 lb 7.5 oz)    Exam: General: Alert and awake, oriented x3, not in any acute distress. HEENT: anicteric sclera, pupils reactive to light and accommodation, EOMI CVS: S1-S2 clear, no murmur rubs or gallops Chest: clear to auscultation bilaterally, no wheezing, rales or rhonchi Abdomen: soft nontender, nondistended, normal bowel sounds, no organomegaly Extremities: no cyanosis, clubbing or edema noted bilaterally Neuro: Cranial nerves II-XII intact, no focal neurological deficits  Data Reviewed: Basic Metabolic Panel:  Recent Labs Lab 07/24/15 0430  07/27/15 0625 07/28/15 0832 07/29/15 0644 07/29/15 1732 07/30/15 1045  NA 142  < > 143 146* 153* 155* 153*    K 3.8  < > 4.6 4.4 3.7 3.1* 3.1*  CL 111  < > 108 111 116* 115* 116*  CO2 21*  < > 24 24 24 26 25   GLUCOSE 173*  < > 242* 153* 143* 127* 183*  BUN 50*  < > 79* 96* 67* 55* 38*  CREATININE 1.32*  < > 1.49* 1.68* 1.28* 1.24 1.20  CALCIUM 7.2*  < > 7.3* 7.8* 8.0* 8.1* 7.7*  MG 2.3  --  2.1  --   --   --  2.0  PHOS 3.5  --  3.9  --   --   --   --   < > = values in this interval not displayed. Liver Function Tests:  Recent Labs Lab 07/24/15 0430 07/25/15 0522 07/26/15 0422 07/27/15 0625 07/28/15 0832  AST  --  27 24 96* 47*  ALT  --  26 29 77* 69*  ALKPHOS  --  67 85 156* 135*  BILITOT  --  0.2* 0.3 0.5 0.6  PROT  --  4.4* 5.7* 5.7* 6.6  ALBUMIN 1.9* 1.7* 1.9* 1.8* 2.3*   No results for input(s): LIPASE, AMYLASE in the last 168 hours. No results for input(s): AMMONIA in the last 168 hours. CBC:  Recent Labs Lab 07/26/15 0422 07/27/15 0625 07/28/15 0832 07/29/15 0644 07/30/15 0636  WBC 7.7 7.0 10.5 9.5 10.3  NEUTROABS 5.1  --   --   --   --   HGB 8.7* 8.4* 9.4* 9.2* 9.5*  HCT 28.7* 26.2* 29.5* 29.2* 30.4*  MCV 99.3 100.4* 100.3* 97.7 100.0  PLT 346 303 311 294 323   Cardiac Enzymes:  Recent Labs Lab 07/26/15 0422  TROPONINI 0.16*   BNP (last 3 results)  Recent Labs  07/18/15 1334 07/19/15 1622  BNP 618.1* 1734.1*    ProBNP (last 3 results) No results for input(s): PROBNP in the last 8760 hours.  CBG:  Recent Labs Lab 07/29/15 1516 07/29/15 2033 07/30/15 0032 07/30/15 0450 07/30/15 1024  GLUCAP 140* 94 111* 165* 149*    Micro Recent Results (from the past 240 hour(s))  Culture, respiratory (NON-Expectorated)     Status: None   Collection Time: 07/21/15  9:50 AM  Result Value Ref Range Status   Specimen Description BRONCHIAL ALVEOLAR LAVAGE  Final   Special Requests Normal  Final   Gram Stain   Final    RARE WBC PRESENT, PREDOMINANTLY PMN NO SQUAMOUS EPITHELIAL CELLS SEEN NO ORGANISMS SEEN Performed at [facility]    Culture    Final    NO GROWTH 2 DAYS Performed at [facility]    Report Status 07/23/2015 FINAL  Final  AFB culture with smear (NOT at [facility])     Status: None (Preliminary result)   Collection Time: 07/21/15 10:34 AM  Result Value Ref Range Status   Specimen Description BRONCHIAL ALVEOLAR LAVAGE  Final   Special Requests Normal  Final   Acid Fast Smear   Final    NO ACID FAST BACILLI SEEN Performed at [facility]    Culture   Final    CULTURE WILL BE EXAMINED FOR 6 WEEKS BEFORE ISSUING A FINAL REPORT Performed at [facility]    Report Status PENDING  Incomplete  Fungus Culture with Smear     Status: None (  Preliminary result)   Collection Time: 07/21/15 10:34 AM  Result Value Ref Range Status   Specimen Description BRONCHIAL ALVEOLAR LAVAGE  Final   Special Requests Normal  Final   Fungal Smear   Final    NO YEAST OR FUNGAL ELEMENTS SEEN Performed at [facility]    Culture   Final    CULTURE IN PROGRESS FOR FOUR WEEKS Performed at [facility]    Report Status PENDING  Incomplete  Pneumocystis smear by DFA     Status: None   Collection Time: 07/21/15 10:34 AM  Result Value Ref Range Status   Specimen Source-PJSRC BRONCHIAL ALVEOLAR LAVAGE  Final   Pneumocystis jiroveci Ag NEGATIVE  Final    Comment: Performed at [facility]  Respiratory virus panel     Status: None   Collection Time: 07/21/15  7:45 PM  Result Value Ref Range Status   Respiratory Syncytial Virus A Negative Negative Final   Respiratory Syncytial Virus B Negative Negative Final   Influenza A Negative Negative Final   Influenza B Negative Negative Final   Parainfluenza 1 Negative Negative Final   Parainfluenza 2 Negative Negative Final   Parainfluenza 3 Negative Negative Final   Metapneumovirus Negative Negative Final   Rhinovirus Negative Negative Final   Adenovirus Negative Negative Final    Comment: (NOTE) Performed At: [facility] [address] [place], [place] [id number] Lindon Romp MD [id number]   Culture, respiratory (NON-Expectorated)     Status: None   Collection Time: 07/25/15  3:20 PM  Result Value Ref Range Status   Specimen Description BRONCHIAL ALVEOLAR LAVAGE  Final   Special Requests NONE  Final   Gram Stain   Final    NO WBC SEEN NO SQUAMOUS EPITHELIAL CELLS SEEN NO ORGANISMS SEEN Performed at [facility]    Culture   Final    FEW YEAST CONSISTENT WITH CANDIDA SPECIES Performed at [facility]    Report Status 07/27/2015 FINAL  Final     Studies: Dg Chest Port 1 View  07/29/2015  CLINICAL DATA:  Respiratory failure, CHF, sepsis, acute encephalopathy EXAM: PORTABLE CHEST 1 VIEW COMPARISON:  Portable chest x-ray of July 28, 2015 FINDINGS: The lungs are adequately inflated. The pulmonary interstitial markings are slightly less conspicuous overall today. The right hemidiaphragm is sharp. On the left the hemidiaphragm is now partially visible. The cardiac silhouette remains enlarged. The pulmonary vascularity is engorged but more distinct today. The mediastinum is normal in width. There has been interval extubation of the trachea and of the esophagus. The PICC line tip projects over the junction of the proximal and midportions of the SVC. IMPRESSION: Mild interval improvement in pulmonary interstitial edema secondary to CHF. Decreasing bilateral pleural effusions. Interval extubation of the trachea and esophagus. Electronically Signed   By: David  Martinique M.D.   On: 07/29/2015 07:26   Dg Abd Portable 1v  07/30/2015  CLINICAL DATA:  NG tube placement EXAM: PORTABLE ABDOMEN - 1 VIEW COMPARISON:  Portable abdominal film of July 29, 2015 FINDINGS: The feeding tube is been removed and a nasogastric tube placed. However, the proximal port is at or above the GE junction and the tip lies in the cardia. Advancement by 15-20 cm at least is recommended. There is persistent left lower lobe  atelectasis or pneumonia. The gas pattern in the upper abdomen is within the limits of normal. Metallic posterior fusion is devices are present from the lower thoracic  spine throughout the lumbar spine. IMPRESSION: Advancement of the nasogastric tube by between 15 and 20 cm is needed to assure that the proximal port is positioned below the GE junction. Left lower lobe atelectasis or pneumonia. Electronically Signed   By: David  Martinique M.D.   On: 07/30/2015 08:01   Dg Abd Portable 1v  07/29/2015  CLINICAL DATA:  Encounter for feeding tube placement. EXAM: PORTABLE ABDOMEN - 1 VIEW COMPARISON:  None. FINDINGS: The bowel gas pattern is normal. Status post surgical fusion of lower thoracic and lumbar spine. Distal tip of feeding tube is seen in expected position of second portion of duodenum. IMPRESSION: Distal tip of feeding tube seen in expected position of second portion of duodenum. Electronically Signed   By: Marijo Conception, M.D.   On: 07/29/2015 15:08    Scheduled Meds: . antiseptic oral rinse  7 mL Mouth Rinse QID  . chlorhexidine gluconate  15 mL Mouth Rinse BID  . digoxin  0.125 mg Intravenous Daily  . diltiazem  30 mg Oral 4 times per day  . feeding supplement (PRO-STAT SUGAR FREE 64)  30 mL Per Tube Daily  . folic acid  1 mg Per Tube Daily  . hydrocortisone sod succinate (SOLU-CORTEF) inj  50 mg Intravenous Q6H  . insulin aspart  0-20 Units Subcutaneous 6 times per day  . insulin aspart  3 Units Subcutaneous 6 times per day  . insulin NPH Human  15 Units Subcutaneous BID AC & HS  . levalbuterol  0.63 mg Nebulization Q6H  . meropenem (MERREM) IV  1 g Intravenous 3 times per day  . metoprolol  5 mg Intravenous 4 times per day  . pantoprazole sodium  40 mg Per Tube Daily  . polyethylene glycol  17 g Per Tube Daily  . sodium chloride flush  10-40 mL Intracatheter Q12H   Continuous Infusions: . amiodarone 60 mg/hr (07/30/15 0900)  . amiodarone 30 mg/hr (07/30/15 1235)  . dextrose 75  mL/hr at 07/29/15 1221  . esmolol Stopped (07/29/15 1655)  . feeding supplement (JEVITY 1.2 CAL) 20 mL/hr (07/30/15 1135)  . heparin 1,650 Units/hr (07/30/15 1249)  . phenylephrine (NEO-SYNEPHRINE) Adult infusion Stopped (07/28/15 0023)       Time spent: 35 minutes    [facility] A  [facility] Pager [phone] If 7PM-7AM, please contact night-coverage at [email or web address], password [facility] 07/30/2015, 1:29 PM  LOS: 13 days

## 2015-07-30 NOTE — Evaluation (Signed)
Occupational Therapy Evaluation Patient Details Name: Curtis Frazier MRN: [id number] DOB: [birth year] Today's Date: 07/30/2015    History of Present Illness 17 yom who is s/p recent lumbar surg on 2/2. Following d/c on 2/4 developed acute confusion and worsening weakness w/ fever > 102. He was brought to the ER. Admitted w/ working dx of sepsis felt d/t PNA +/- UTI. His hospital course was has consisted of  intermittent fever, AF w/ RVR, hypoxia and confusion. On 2/7 doing better. On 2/8 developed increased SOB, and was placed on NIPPV, this was followed again by fever and then worsening agitation. on 2/8 he was intubated after his agitation escalated to point he would no longer keep his BIPAP on, worsening CHF. Extubated 2/15. Now on Venturi mask   Clinical Impression   This 75 yo male admitted with above presents to acute OT with deficits below affecting his ability to care for himself and increasing burden of care on family. He will benefit from acute OT with follow up OT on LTACH to get to a min A level or better.    Follow Up Recommendations  LTACH    Equipment Recommendations   (TBD next venue)       Precautions / Restrictions Precautions Precautions: Fall;Back Precaution Comments: Monitor HR (Afib and tachy) Required Braces or Orthoses:  (NO brace per his wife) Restrictions Weight Bearing Restrictions: Yes      Mobility Bed Mobility Overal bed mobility: Needs Assistance Bed Mobility: Rolling Rolling: Max assist (left and right)         General bed mobility comments: cues and A for seqencing to log roll due to back precautions.. Did not do anymore than rolling due to AFIB with tachycardia           ADL Overall ADL's : Needs assistance/impaired Eating/Feeding: NPO   Grooming: Wash/dry face;Maximal assistance;Bed level (hand over hand)   Upper Body Bathing: Total assistance;Bed level   Lower Body Bathing: Total assistance;Bed level   Upper Body Dressing : Total  assistance;Bed level   Lower Body Dressing: Total assistance;Bed level                                 Pertinent Vitals/Pain Pain Assessment: Faces Faces Pain Scale: No hurt (Pt also reports no pain by shaking his head)     Hand Dominance   Right  Extremity/Trunk Assessment Upper Extremity Assessment Upper Extremity Assessment: RUE deficits/detail;LUE deficits/detail RUE Deficits / Details: 2-/5 shoulder; rest 3/5 with decreased coordination LUE Deficits / Details: 3/5   Lower Extremity Assessment Lower Extremity Assessment: Defer to PT evaluation (generalized weakness; can move all joints with A and increased timed)       Communication Communication Communication:  (low voice)   Cognition Arousal/Alertness: Awake/alert Behavior During Therapy: Flat affect Overall Cognitive Status: No family/caregiver present to determine baseline cognitive functioning Area of Impairment: Orientation;Following commands;Problem solving Orientation Level:  (oriented to place and month when given choices and pt shaking his head; not oriented to year)     Following Commands: Follows one step commands with increased time     Problem Solving: Slow processing;Decreased initiation;Difficulty sequencing;Requires verbal cues;Requires tactile cues                Home Living Family/patient expects to be discharged to::  [facility]) Living Arrangements: Spouse/significant other Available Help at Discharge: Family;Available PRN/intermittently  Prior Functioning/Environment Level of Independence:  (unknown)             OT Diagnosis: Generalized weakness;Cognitive deficits   OT Problem List: Decreased strength;Decreased range of motion;Decreased activity tolerance;Impaired balance (sitting and/or standing);Impaired UE functional use;Cardiopulmonary status limiting activity;Decreased knowledge of use of DME or AE   OT  Treatment/Interventions: Self-care/ADL training;Patient/family education;Balance training;Therapeutic activities;DME and/or AE instruction;Cognitive remediation/compensation    OT Goals(Current goals can be found in the care plan section) Acute Rehab OT Goals Patient Stated Goal: difficult for pt due to low voice quality and increased time to process info OT Goal Formulation: With patient (shook his head yes to [place] coming back to work with him) Time For Goal Achievement: 08/13/15 Potential to Achieve Goals: Good  OT Frequency: Min 2X/week   Barriers to D/C: Decreased caregiver support (per CM note all family work)             End of Session Nurse Communication:  (NT: need to follow back precautions (rolling in bed) due to recent back sx)  Activity Tolerance:  (limited by HR) Patient left: in bed;with call bell/phone within reach;with bed alarm set   Time: [id number] OT Time Calculation (min): 23 min Charges:  OT General Charges $OT Visit: 1 Procedure OT Evaluation $OT Eval Moderate Complexity: 1 Procedure OT Treatments $Self Care/Home Management : 8-22 mins  Almon Register [id number] 07/30/2015, 9:18 AM

## 2015-07-31 ENCOUNTER — Inpatient Hospital Stay (HOSPITAL_COMMUNITY): Payer: Medicare Other

## 2015-07-31 DIAGNOSIS — R410 Disorientation, unspecified: Secondary | ICD-10-CM

## 2015-07-31 DIAGNOSIS — K5901 Slow transit constipation: Secondary | ICD-10-CM

## 2015-07-31 DIAGNOSIS — M549 Dorsalgia, unspecified: Secondary | ICD-10-CM | POA: Insufficient documentation

## 2015-07-31 DIAGNOSIS — I10 Essential (primary) hypertension: Secondary | ICD-10-CM

## 2015-07-31 LAB — GLUCOSE, CAPILLARY
GLUCOSE-CAPILLARY: 155 mg/dL — AB (ref 65–99)
Glucose-Capillary: 79 mg/dL (ref 65–99)
Glucose-Capillary: 144 mg/dL — ABNORMAL HIGH (ref 65–99)
Glucose-Capillary: 156 mg/dL — ABNORMAL HIGH (ref 65–99)
Glucose-Capillary: 214 mg/dL — ABNORMAL HIGH (ref 65–99)
Glucose-Capillary: 195 mg/dL — ABNORMAL HIGH (ref 65–99)

## 2015-07-31 LAB — CBC
HEMATOCRIT: 32.5 % — AB (ref 39.0–52.0)
HEMOGLOBIN: 9.8 g/dL — AB (ref 13.0–17.0)
MCH: 30 pg (ref 26.0–34.0)
MCHC: 30.2 g/dL (ref 30.0–36.0)
MCV: 99.4 fL (ref 78.0–100.0)
Platelets: 325 10*3/uL (ref 150–400)
RBC: 3.27 MIL/uL — ABNORMAL LOW (ref 4.22–5.81)
RDW: 17.7 % — ABNORMAL HIGH (ref 11.5–15.5)
WBC: 12.3 10*3/uL — ABNORMAL HIGH (ref 4.0–10.5)

## 2015-07-31 LAB — BLOOD GAS, ARTERIAL
ACID-BASE EXCESS: 7.9 mmol/L — AB (ref 0.0–2.0)
Bicarbonate: 31.3 mEq/L — ABNORMAL HIGH (ref 20.0–24.0)
DRAWN BY: 42624
FIO2: 0.5
O2 SAT: 95.8 %
PATIENT TEMPERATURE: 98.6
PCO2 ART: 38.7 mmHg (ref 35.0–45.0)
TCO2: 32.5 mmol/L (ref 0–100)
pH, Arterial: 7.519 — ABNORMAL HIGH (ref 7.350–7.450)
pO2, Arterial: 78 mmHg — ABNORMAL LOW (ref 80.0–100.0)

## 2015-07-31 LAB — BASIC METABOLIC PANEL
Anion gap: 12 (ref 5–15)
BUN: 31 mg/dL — AB (ref 6–20)
CHLORIDE: 106 mmol/L (ref 101–111)
CO2: 31 mmol/L (ref 22–32)
CREATININE: 1.3 mg/dL — AB (ref 0.61–1.24)
Calcium: 7.9 mg/dL — ABNORMAL LOW (ref 8.9–10.3)
GFR calc Af Amer: 60 mL/min — ABNORMAL LOW (ref >60)
GFR calc non Af Amer: 52 mL/min — ABNORMAL LOW (ref >60)
Glucose, Bld: 90 mg/dL (ref 65–99)
Potassium: 3.2 mmol/L — ABNORMAL LOW (ref 3.5–5.1)
SODIUM: 149 mmol/L — AB (ref 135–145)

## 2015-07-31 LAB — HEPARIN LEVEL (UNFRACTIONATED): Heparin Unfractionated: 0.67 IU/mL (ref 0.30–0.70)

## 2015-07-31 MED ORDER — FUROSEMIDE 10 MG/ML IJ SOLN
60.0000 mg | Freq: Once | INTRAMUSCULAR | Status: AC
  Administered 2015-07-31: 60 mg via INTRAVENOUS
  Filled 2015-07-31: qty 6

## 2015-07-31 MED ORDER — LORAZEPAM 2 MG/ML IJ SOLN
0.5000 mg | Freq: Four times a day (QID) | INTRAMUSCULAR | Status: DC | PRN
  Administered 2015-07-31: 1 mg via INTRAVENOUS
  Filled 2015-07-31: qty 1

## 2015-07-31 MED ORDER — DILTIAZEM HCL 60 MG PO TABS
90.0000 mg | ORAL_TABLET | Freq: Four times a day (QID) | ORAL | Status: DC
  Administered 2015-07-31 – 2015-08-01 (×6): 90 mg via ORAL
  Filled 2015-07-31 (×7): qty 1

## 2015-07-31 MED ORDER — FREE WATER
300.0000 mL | Freq: Four times a day (QID) | Status: DC
  Administered 2015-07-31 – 2015-08-02 (×9): 300 mL

## 2015-07-31 MED ORDER — POTASSIUM CHLORIDE 20 MEQ/15ML (10%) PO SOLN
60.0000 meq | Freq: Three times a day (TID) | ORAL | Status: AC
  Administered 2015-07-31 (×2): 60 meq
  Filled 2015-07-31 (×2): qty 45

## 2015-07-31 MED ORDER — BISACODYL 10 MG RE SUPP
10.0000 mg | Freq: Once | RECTAL | Status: AC
  Administered 2015-07-31: 10 mg via RECTAL
  Filled 2015-07-31: qty 1

## 2015-07-31 MED ORDER — AMIODARONE HCL IN DEXTROSE 360-4.14 MG/200ML-% IV SOLN
60.0000 mg/h | INTRAVENOUS | Status: AC
  Administered 2015-07-31 (×2): 60 mg/h via INTRAVENOUS
  Filled 2015-07-31 (×2): qty 200

## 2015-07-31 MED ORDER — AMIODARONE HCL IN DEXTROSE 360-4.14 MG/200ML-% IV SOLN
30.0000 mg/h | INTRAVENOUS | Status: DC
  Administered 2015-07-31 – 2015-08-03 (×6): 30 mg/h via INTRAVENOUS
  Filled 2015-07-31 (×5): qty 200

## 2015-07-31 MED ORDER — LORAZEPAM 2 MG/ML IJ SOLN
1.0000 mg | Freq: Once | INTRAMUSCULAR | Status: AC
  Administered 2015-07-31: 1 mg via INTRAVENOUS
  Filled 2015-07-31: qty 1

## 2015-07-31 MED ORDER — AMIODARONE LOAD VIA INFUSION
150.0000 mg | Freq: Once | INTRAVENOUS | Status: AC
  Administered 2015-07-31: 150 mg via INTRAVENOUS
  Filled 2015-07-31: qty 83.34

## 2015-07-31 MED ORDER — METOPROLOL TARTRATE 1 MG/ML IV SOLN
5.0000 mg | Freq: Once | INTRAVENOUS | Status: AC
  Administered 2015-07-31: 5 mg via INTRAVENOUS
  Filled 2015-07-31: qty 5

## 2015-07-31 NOTE — Progress Notes (Signed)
ANTICOAGULATION CONSULT NOTE  Pharmacy Consult:  Heparin Indication: atrial fibrillation  Allergies  Allergen Reactions  . Ace Inhibitors Other (See Comments)    Causes high K  . Angiotensin Receptor Blockers Other (See Comments)    Causes high K  . Metoprolol Other (See Comments)    May cause elevated K level  . Penicillins Swelling and Rash  . Tetracycline Swelling and Rash    Patient Measurements: Height: 5\' 8"  (172.7 cm) Weight: 197 lb 12 oz (89.7 kg) IBW/kg (Calculated) : 68.4 Heparin Dosing Weight: 89 kg  Vital Signs: Temp: 98.5 F (36.9 C) (02/18 0445) Temp Source: Axillary (02/18 0445) BP: 150/92 mmHg (02/18 0700) Pulse Rate: 120 (02/18 0700)  Labs:  Recent Labs  07/28/15 0832 07/29/15 0644 07/29/15 1732 07/29/15 2229 07/30/15 0636 07/30/15 1045 07/31/15 0603  HGB 9.4* 9.2*  --   --  9.5*  --  9.8*  HCT 29.5* 29.2*  --   --  30.4*  --  32.5*  PLT 311 294  --   --  323  --  325  LABPROT 15.3*  --   --   --   --   --   --   INR 1.19  --   --   --   --   --   --   HEPARINUNFRC  --   --   --  0.68 0.69  --  0.67  CREATININE 1.68* 1.28* 1.24  --   --  1.20  --     Estimated Creatinine Clearance: 57 mL/min (by C-G formula based on Cr of 1.2).    Assessment: 78 YOM continues on IV heparin for new-onset Afib.  Heparin level is therapeutic and toward the high end of normal.  No bleeding reported. Pt is still hypokalemic despite K replacement yesterday, also hypernatremic.   Goal of Therapy:  Heparin level 0.3-0.7 units/ml Monitor platelets by anticoagulation protocol: Yes    Plan:  - Decrease heparin gtt slightly to 1600 units/hr - Daily HL / CBC - F/U long-term AC plan - F/U KCL supplementation and monitor sodium     Hughes Better, PharmD, BCPS Clinical Pharmacist Pager: [phone] 07/31/2015 8:22 AM

## 2015-07-31 NOTE — Evaluation (Signed)
Physical Therapy Evaluation Patient Details Name: Curtis Frazier MRN: [id number] DOB: 12-22-1940 Today's Date: 07/31/2015   History of Present Illness  64 yom who is s/p recent lumbar surg on 2/2. Following d/c on 2/4 developed acute confusion and worsening weakness w/ fever > 102. He was brought to the ER. Admitted w/ working dx of sepsis felt d/t PNA +/- UTI. His hospital course was has consisted of  intermittent fever, AF w/ RVR, hypoxia and confusion. On 2/7 doing better. On 2/8 developed increased SOB, and was placed on NIPPV, this was followed again by fever and then worsening agitation. on 2/8 he was intubated after his agitation escalated to point he would no longer keep his BIPAP on, worsening CHF. Extubated 2/15. Now on Venturi mask  Clinical Impression  Pt admitted with above diagnosis. Pt currently with functional limitations due to the deficits listed below (see PT Problem List). Pt was able to sit EOB but needed +2 assist with significant posterior lean.  HR jumped up to 158 bpm therefore laid pt back down at end of rx.  Nursing made aware of incr HR and BP and that pt was left on bedpan per his request.  Pt comfortable on departure.  Will follow acutely.   Pt will benefit from skilled PT to increase their independence and safety with mobility to allow discharge to the venue listed below.    Follow Up Recommendations LTACH;Supervision/Assistance - 24 hour    Equipment Recommendations  Other (comment) (TBA)    Recommendations for Other Services       Precautions / Restrictions Precautions Precautions: Fall;Back Precaution Comments: Monitor HR (Afib and tachy);BP high Restrictions Weight Bearing Restrictions: No      Mobility  Bed Mobility Overal bed mobility: Needs Assistance;+2 for physical assistance Bed Mobility: Rolling;Sidelying to Sit;Sit to Supine Rolling: Max assist (left and right) Sidelying to sit: Max assist;+2 for physical assistance   Sit to supine: Total  assist;+2 for physical assistance   General bed mobility comments: cues and A for seqencing to log roll due to back precautions.  Asssist for LEs and for elevation of trunk.    Transfers Overall transfer level: Needs assistance Equipment used: Rolling walker (2 wheeled);2 person hand held assist Transfers: Sit to/from Stand Sit to Stand: Max assist;+2 physical assistance;From elevated surface         General transfer comment: Attempted to stand to RW and pt unable to achieve full upright stance.  Pt with flexed hips and knees and even with max assist could not stand pt upright.  Then, attempted to stand and pivot pt with +2 assist side by side transfer as pt really wanted to sit on 3N1 however pt still could not stand enought to pivot.  Pt HR incr to 158 bpm therefore decided to lie pt back down and get bedpan.  Total assist to lie pt down and to move up in bed as well as to place bedpan.    Ambulation/Gait                Stairs            Wheelchair Mobility    Modified Rankin (Stroke Patients Only)       Balance Overall balance assessment: Needs assistance;History of Falls Sitting-balance support: No upper extremity supported;Feet supported Sitting balance-Leahy Scale: Poor Sitting balance - Comments: Pt needed mod to max assist to sit EOB with pt leaning posteriorly.  Pt needed cues and assist to sit for 5 minutes at  EOb.  Pt not using his Ue support effectively.    Postural control: Posterior lean Standing balance support: Bilateral upper extremity supported;During functional activity Standing balance-Leahy Scale: Zero Standing balance comment: Needed max assist to stand with bil UE support and could not stand fully upright.                               Pertinent Vitals/Pain Pain Assessment: Faces Faces Pain Scale: Hurts even more Pain Location: back Pain Descriptors / Indicators: Aching;Grimacing;Guarding Pain Intervention(s): Limited activity  within patient's tolerance;Monitored during session;Repositioned  HR 124-158 bpm with sitting EOb, BP 174/116 and up to 190/116 at end of treatment.  Made nurse aware.  O2 88-92% on 5LO2.      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 Dominance   Dominant Hand: Right    Extremity/Trunk Assessment   Upper Extremity Assessment: Defer to OT evaluation           Lower Extremity Assessment: RLE deficits/detail;LLE deficits/detail RLE Deficits / Details: grossly 3-/5 LLE Deficits / Details: grossly 3-/5  Cervical / Trunk Assessment: Kyphotic  Communication      Cognition Arousal/Alertness: Awake/alert Behavior During Therapy: Flat affect Overall Cognitive Status: Impaired/Different from baseline Area of Impairment: Orientation;Following commands;Problem solving Orientation Level: Disoriented to;Place;Time;Situation     Following Commands: Follows one step commands with increased time     Problem Solving: Slow processing;Decreased initiation;Difficulty sequencing;Requires verbal cues;Requires tactile cues      General Comments      Exercises General Exercises - Lower Extremity Ankle Circles/Pumps: AROM;Both;5 reps;Seated Long Arc Quad: AAROM;Both;5 reps;Seated      Assessment/Plan    PT Assessment Patient needs continued PT services  PT Diagnosis Generalized weakness;Acute pain   PT Problem List Decreased activity tolerance;Decreased balance;Decreased mobility;Decreased knowledge of use of DME;Decreased safety awareness;Decreased knowledge of  precautions;Pain;Decreased strength;Decreased range of motion  PT Treatment Interventions     PT Goals (Current goals can be found in the Care Plan section) Acute Rehab PT Goals Patient Stated Goal: difficult for pt due to low voice quality and increased time to process info PT Goal Formulation: With patient Time For Goal Achievement: 08/14/15 Potential to Achieve Goals: Good    Frequency Min 3X/week   Barriers to discharge Decreased caregiver support wife has back issues    Co-evaluation               End of Session Equipment Utilized During Treatment: Gait belt;Oxygen Activity Tolerance: Patient limited by fatigue;Patient limited by pain Patient left: in bed;with call bell/phone within reach;with bed alarm set;with family/visitor present;with SCD's reapplied Nurse Communication: Mobility status;Need for lift equipment         Time: 1009-1040 PT Time Calculation (min) (ACUTE ONLY): 31 min   Charges:   PT Evaluation $PT Eval Moderate Complexity: 1 Procedure PT Treatments $Therapeutic Activity: 8-22 mins   PT G CodesIrwin Brakeman F 2015-08-28, 1:06 PM Racer Quam,PT Acute Rehabilitation [phone] (pager)

## 2015-07-31 NOTE — Progress Notes (Signed)
SUBJECTIVE: Remains ill.  Still in AF with RVR.  BP has come up.  Marland Kitchen antiseptic oral rinse  7 mL Mouth Rinse QID  . chlorhexidine gluconate  15 mL Mouth Rinse BID  . digoxin  0.125 mg Intravenous Daily  . diltiazem  90 mg Oral 4 times per day  . feeding supplement (PRO-STAT SUGAR FREE 64)  30 mL Per Tube Daily  . folic acid  1 mg Per Tube Daily  . free water  300 mL Per Tube Q6H  . hydrocortisone sod succinate (SOLU-CORTEF) inj  50 mg Intravenous Q12H  . insulin aspart  0-20 Units Subcutaneous 6 times per day  . insulin aspart  3 Units Subcutaneous 6 times per day  . insulin NPH Human  15 Units Subcutaneous BID AC & HS  . levalbuterol  0.63 mg Nebulization Q6H  . pantoprazole sodium  40 mg Per Tube Daily  . polyethylene glycol  17 g Per Tube Daily  . potassium chloride  60 mEq Per Tube TID  . sodium chloride flush  10-40 mL Intracatheter Q12H   . esmolol Stopped (07/29/15 1655)  . feeding supplement (JEVITY 1.2 CAL) 1,000 mL (07/30/15 2126)  . heparin 1,600 Units/hr (07/31/15 0934)    OBJECTIVE: Physical Exam: Filed Vitals:   07/31/15 0445 07/31/15 0452 07/31/15 0700 07/31/15 0806  BP: 153/96  150/92 156/89  Pulse: 134  120 129  Temp: 98.5 F (36.9 C)   98.6 F (37 C)  TempSrc: Axillary   Axillary  Resp: 15  18 13   Height:      Weight:  197 lb 12 oz (89.7 kg)    SpO2: 94%  93% 94%    Intake/Output Summary (Last 24 hours) at 07/31/15 1006 Last data filed at 07/31/15 0950  Gross per 24 hour  Intake 1949.13 ml  Output   7301 ml  Net -5351.87 ml    Telemetry reveals afib, V rates 110s-30s  GEN- The patient is elderly and chronically ill appearing, alert and oriented x 3 today.   Head- normocephalic, atraumatic Eyes-  Sclera clear, conjunctiva pink Ears- hearing intact Oropharynx- NGT in place Neck- supple  Lungs- Clear to ausculation bilaterally, normal work of breathing Heart- tachycardic irregular rhythm GI- distended, tender Extremities- no clubbing,  cyanosis, + dependant edema Skin- no rash or lesion Psych- euthymic mood, full affect Neuro- strength and sensation are intact  LABS: Basic Metabolic Panel:  Recent Labs  07/30/15 1045 07/31/15 0603  NA 153* 149*  K 3.1* 3.2*  CL 116* 106  CO2 25 31  GLUCOSE 183* 90  BUN 38* 31*  CREATININE 1.20 1.30*  CALCIUM 7.7* 7.9*  MG 2.0  --    CBC:  Recent Labs  07/30/15 0636 07/31/15 0603  WBC 10.3 12.3*  HGB 9.5* 9.8*  HCT 30.4* 32.5*  MCV 100.0 99.4  PLT 323 325    ASSESSMENT AND PLAN:   1.  Atrial fibrillation with RVR:  Likely due to underlying medical illness Continue IV amiodarone Will increase diltiazem to 90mg  q6 hours.  If there is concern for absorption, could convert to IV cardizem Tolerating IV heparin.  Would favor oral anticoagulation once clinically improved with cardioversion and close outpatient follow-up chads2vasc score is at least 5.  Should anticoagulate long term if able.  Given prior GI bleeds, could consider elective left atrial appendage occlusion if he makes full recovery from current illness.  2. HTN Sepsis is improved and BP has increased Increase diltiazem as above  3. CAD: known h/o CAD-s/p CFX BMS 2010, Myoview low risk Nov 2016, no active ischemia   4. CRI stage 3 Stable No change required today  5. Hx of PMR requiring chronic steroids, currently on stress dose steroids  6. Hx of GI bleed 2014- AVMs As above  Prognosis is currently guarded   Thompson Grayer, MD 07/31/2015 10:06 AM

## 2015-07-31 NOTE — Progress Notes (Signed)
Text message sent to Dr. Hartford Poli to keep him updated on pt's status.  BP 173/97; HR 130's to 150's a-fib.  Amiodarone drip has been discontinued and started on CArdizem 90 mg.

## 2015-07-31 NOTE — Procedures (Signed)
Pt NTS.  Large, thick, tenacious secretions obtained from pt left nare.

## 2015-07-31 NOTE — Progress Notes (Addendum)
Daily Progress Note   Patient Name: Curtis Frazier       Date: 07/31/2015 DOB: [birth year]  Age: 75 y.o. MRN#: [id number] Attending Physician: Verlee Monte, MD Primary Care Physician: Loura Pardon, MD Admit Date: 07/17/2015  Reason for Consultation/Follow-up: Establishing goals of care, Non pain symptom management and Pain control  Subjective: Complains of constipation.  Requests to get out of bed, wants ice cream.   Interval Events:  Patient admits to having anxiety.  I will start PRN ativan.  Patient admits to pain in his stomach (but not his back).  He feels a bowel movement would help his pain.  Will give 1x suppository for constipation.  Son Louie Casa and Dtr Loletha Carrow (who is a Biomedical scientist at [employer]) are at bedside.  I answered families questions about medications and treatment.  I reassured Vickie that Cardiology was following him daily.  She asked about cardioversion - I relayed that it is being considered when he improves.  I indicated to Vickie that the patient needs time and for his heart rate to stabilize.   Will continue to follow in a supportive role.   Length of Stay: 14 days  Current Medications: Scheduled Meds:  . antiseptic oral rinse  7 mL Mouth Rinse QID  . bisacodyl  10 mg Rectal Once  . chlorhexidine gluconate  15 mL Mouth Rinse BID  . digoxin  0.125 mg Intravenous Daily  . diltiazem  90 mg Oral 4 times per day  . feeding supplement (PRO-STAT SUGAR FREE 64)  30 mL Per Tube Daily  . folic acid  1 mg Per Tube Daily  . free water  300 mL Per Tube Q6H  . hydrocortisone sod succinate (SOLU-CORTEF) inj  50 mg Intravenous Q12H  . insulin aspart  0-20 Units Subcutaneous 6 times per day  . insulin aspart  3 Units Subcutaneous 6 times per day  . levalbuterol  0.63 mg Nebulization  Q6H  . pantoprazole sodium  40 mg Per Tube Daily  . polyethylene glycol  17 g Per Tube Daily  . potassium chloride  60 mEq Per Tube TID  . sodium chloride flush  10-40 mL Intracatheter Q12H    Continuous Infusions: . feeding supplement (JEVITY 1.2 CAL) 1,000 mL (07/31/15 1143)  . heparin 1,600 Units/hr (07/31/15 0934)    PRN  Meds: bisacodyl, docusate, fentaNYL (SUBLIMAZE) injection, levalbuterol, ondansetron (ZOFRAN) IV, sodium chloride flush   Physical Exam  Elderly male, appropriate, appears week Tachy cardic No increased work of respiration.  Productive cough (a little stronger today) No edema in LE.              Vital Signs: BP 125/75 mmHg  Pulse 130  Temp(Src) 99.8 F (37.7 C) (Axillary)  Resp 22  Ht 5\' 8"  (1.727 m)  Wt 89.7 kg (197 lb 12 oz)  BMI 30.08 kg/m2  SpO2 93% SpO2: SpO2: 93 % O2 Device: O2 Device: Venturi Mask O2 Flow Rate: O2 Flow Rate (L/min): 12 L/min  Intake/output summary:  Intake/Output Summary (Last 24 hours) at 07/31/15 1415 Last data filed at 07/31/15 1352  Gross per 24 hour  Intake 1271.6 ml  Output   9121 ml  Net -7849.4 ml   LBM: Last BM Date: 07/31/15 Baseline Weight: Weight: 83.915 kg (185 lb) Most recent weight: Weight: 89.7 kg (197 lb 12 oz)       Palliative Assessment/Data: Flowsheet Rows        Most Recent Value   Intake Tab    Referral Department  Hospitalist   Unit at Time of Referral  Intermediate Care Unit   Palliative Care Primary Diagnosis  Cardiac   Date Notified  07/19/15   Palliative Care Type  New Palliative care   Reason for referral  Clarify Goals of Care   Date of Admission  07/17/15   Date first seen by Palliative Care  07/20/15   # of days Palliative referral response time  1 Day(s)   # of days IP prior to Palliative referral  2   Clinical Assessment    Psychosocial & Spiritual Assessment    Palliative Care Outcomes       Additional Data Reviewed: CBC    Component Value Date/Time   WBC 12.3*  07/31/2015 0603   RBC 3.27* 07/31/2015 0603   RBC 3.52* 12/19/2013 0819   HGB 9.8* 07/31/2015 0603   HCT 32.5* 07/31/2015 0603   PLT 325 07/31/2015 0603   MCV 99.4 07/31/2015 0603   MCH 30.0 07/31/2015 0603   MCHC 30.2 07/31/2015 0603   RDW 17.7* 07/31/2015 0603   LYMPHSABS 1.2 07/26/2015 0422   MONOABS 1.1* 07/26/2015 0422   EOSABS 0.3 07/26/2015 0422   BASOSABS 0.0 07/26/2015 0422    CMP     Component Value Date/Time   NA 149* 07/31/2015 0603   K 3.2* 07/31/2015 0603   CL 106 07/31/2015 0603   CO2 31 07/31/2015 0603   GLUCOSE 90 07/31/2015 0603   BUN 31* 07/31/2015 0603   CREATININE 1.30* 07/31/2015 0603   CALCIUM 7.9* 07/31/2015 0603   PROT 6.6 07/28/2015 0832   ALBUMIN 2.3* 07/28/2015 0832   AST 47* 07/28/2015 0832   ALT 69* 07/28/2015 0832   ALKPHOS 135* 07/28/2015 0832   BILITOT 0.6 07/28/2015 0832   GFRNONAA 52* 07/31/2015 0603   GFRAA 60* 07/31/2015 0603       Problem List:  Patient Active Problem List   Diagnosis Date Noted  . Disorientation   . Counseling regarding advanced care planning and goals of care   . PAF (paroxysmal atrial fibrillation) ([place]) 07/29/2015  . Advanced care planning/counseling discussion   . Pneumonia   . Change in mental state   . Endotracheally intubated   . CHF (congestive heart failure) ([place])   . Palliative care encounter 07/21/2015  . DNR (do not resuscitate) discussion  07/21/2015  . HCAP (healthcare-associated pneumonia)   . Acute respiratory failure ([place])   . Acute respiratory failure with hypoxia ([place]) 07/18/2015  . Atrial fibrillation with RVR ([place]) 07/18/2015  . Acute pulmonary edema ([place]) 07/18/2015  . Sepsis ([place]) 07/17/2015  . Acute encephalopathy 07/17/2015  . LLL pneumonia 07/17/2015  . Type II or unspecified type diabetes mellitus without mention of complication, not stated as uncontrolled 07/17/2015  . S/P lumbar laminectomy 07/17/2015  . Elevated troponin 07/17/2015  . Type 2 diabetes mellitus without  complication, without long-term current use of insulin ([place])   . Acute bronchitis 03/05/2015  . Back pain, thoracic 01/15/2015  . Chest wall pain 01/15/2015  . Routine general medical examination at a health care facility 11/23/2014  . Productive cough 10/14/2014  . Left flank pain 09/25/2014  . Renal insufficiency 11/14/2013  . Encounter for Medicare annual wellness exam 07/07/2013  . BPH (benign prostatic hyperplasia) 07/05/2012  . Prostate cancer screening 06/27/2012  . Low TSH level 01/22/2012  . PMR (polymyalgia rheumatica) ([facility]) 01/20/2012  . Aortic stenosis 07/08/2011  . New onset a-fib ([place]) 07/05/2011    Class: Acute  . HYPERKALEMIA 02/18/2010  . Essential hypertension, benign 09/13/2009  . ANEMIA-UNSPECIFIED 06/10/2009  . ANGIODYSPLASIA-INTESTINE 06/10/2009  . MURMUR 05/21/2009  . Left carotid bruit 05/21/2009  . Coronary atherosclerosis 03/09/2009  . GERD 03/09/2009  . Rheumatoid arthritis ([place]) 04/29/2008  . HX, PERSONAL, COLONIC POLYPS 01/17/2007  . Diabetes mellitus type 2, controlled, without complications ([place]) [id number]  . Elevated lipids 09/18/2006  . PSORIATIC ARTHRITIS 09/18/2006  . PSORIASIS 09/18/2006  . 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 DEGENERATIVE DISC DISEASE 09/18/2006  . SCOLIOSIS 09/18/2006  . TOBACCO ABUSE, HX OF 09/18/2006     Palliative Care Assessment & Plan    1.Code Status:  Full code   2. Goals of Care/Additional Recommendations:  Limitations on Scope of Treatment: Full scope treatment.  3. Symptom Management:      Constipation - suppository       Pain / Anxiety - will discuss this further with the patient as it may be driving his heart rate up.     Care plan was discussed with family at bedside.  Thank you for allowing the Palliative Medicine Team to assist in the care of this patient.   Time In: 2:00 Time Out: 2:25 pm Total Time 25 min Prolonged Time Billed  no       Imogene Burn, [place] [facility] Pager:  [phone]    07/31/2015, 2:15 PM  Please contact Palliative Medicine Team phone at [phone] for questions and concerns.

## 2015-07-31 NOTE — Progress Notes (Signed)
[facility] PROGRESS NOTE   Curtis Frazier [id number] DOB: 10/29/1940 DOA: 07/17/2015 PCP: Loura Pardon, MD  Subjective: Follows commands appropriately, feels slightly better than yesterday. Complains about stomach pain and constipation.   HPI: 75 yo who is s/p recent lumbar surgery on 2/2. Following d/c on 2/4 developed acute confusion and worsening weakness w/ fever > 102. He was brought to the ER 07/17/15 and admitted w/ working dx of sepsis felt due to PNA. Course complicated by shock, respiratory failure requiring intubation, and atrial fibrillation with RVR. Extubated on 07/28/2015.  Assessment/Plan: Principal Problem:   Sepsis ([place]) Active Problems:   Essential hypertension, benign   Coronary atherosclerosis   Rheumatoid arthritis (HCC)   Acute encephalopathy   LLL pneumonia   Type II or unspecified type diabetes mellitus without mention of complication, not stated as uncontrolled   S/P lumbar laminectomy   Elevated troponin   Acute respiratory failure with hypoxia (HCC)   Acute pulmonary edema (HCC)   HCAP (healthcare-associated pneumonia)   Palliative care encounter   DNR (do not resuscitate) discussion   Acute respiratory failure ([place])   Endotracheally intubated   CHF (congestive heart failure) ([place])   Change in mental state   Advanced care planning/counseling discussion   Pneumonia   PAF (paroxysmal atrial fibrillation) ([place])   Counseling regarding advanced care planning and goals of care    Acute hypoxic respiratory failure Acute respiratory failure with hypoxia, ventilator dependent. Patient extubated on 07/28/2015. Patient was under PCCM until 2/16 transferred to [facility] for further evaluation. Currently on Ventimask.  Sepsis Evident on admission, likely secondary to left lower lobe pneumonia. Patient has a recent lumbar laminectomy, MR of the brain showed no evidence of infection. He has some fluid collection likely postoperative seroma, radiology  finding consistent with arachnoiditis.   Acute on chronic CHF Patient has acute CHF with preserved LVEF of 65%. Still has some fluid overload, continue Lasix. Last chest x-ray from 2/16 showed improvement of interstitial pulmonary edema.  Atrial fibrillation with rapid ventricular response Appreciate cardiology consultation, patient is on amiodarone, diltiazem, digoxin and metoprolol as needed. Rate is not controlled. On heparin drip.  LLL pneumonia  Currently on antibiotics. Continue deep suctioned to remove secretions.  Hypernatremia Discontinue D5W and start free water flushes.  Hypokalemia Replete with oral supplements, magnesium is okay at 2.0.  CK stage III Creatinine baseline is 1.5, around baseline for now.  Status post lumbar laminectomy Has had recent back surgery, according to notes from neurosurgery no evidence of infection. Patient anyway is on broad-spectrum antibiotics.  Rheumatoid arthritis and PMR Patient is on methotrexate and 5 mg of prednisone at home. Currently on stress dose of steroids, will taper off.  Code Status: Full Code Family Communication: Plan discussed with the patient. Disposition Plan: Remains inpatient Diet:    Consultants:  Patient transferred to [facility] service on 07/30/2015  Procedures:  None  Antibiotics:  Meropenem and vancomycin   Objective: Filed Vitals:   07/31/15 0700 07/31/15 0806  BP: 150/92 156/89  Pulse: 120 129  Temp:  98.6 F (37 C)  Resp: 18 13    Intake/Output Summary (Last 24 hours) at 07/31/15 1105 Last data filed at 07/31/15 0950  Gross per 24 hour  Intake 1814.33 ml  Output   7301 ml  Net -5486.67 ml   Filed Weights   07/29/15 0404 07/30/15 0250 07/31/15 0452  Weight: 95.5 kg (210 lb 8.6 oz) 93.2 kg (205 lb 7.5 oz) 89.7 kg (197 lb 12 oz)  Exam: General: Alert and awake, oriented x3, not in any acute distress. HEENT: anicteric sclera, pupils reactive to light and accommodation, EOMI CVS:  S1-S2 clear, no murmur rubs or gallops Chest: clear to auscultation bilaterally, no wheezing, rales or rhonchi Abdomen: soft nontender, nondistended, normal bowel sounds, no organomegaly Extremities: no cyanosis, clubbing or edema noted bilaterally Neuro: Cranial nerves II-XII intact, no focal neurological deficits  Data Reviewed: Basic Metabolic Panel:  Recent Labs Lab 07/27/15 0625 07/28/15 0832 07/29/15 0644 07/29/15 1732 07/30/15 1045 07/31/15 0603  NA 143 146* 153* 155* 153* 149*  K 4.6 4.4 3.7 3.1* 3.1* 3.2*  CL 108 111 116* 115* 116* 106  CO2 24 24 24 26 25 31   GLUCOSE 242* 153* 143* 127* 183* 90  BUN 79* 96* 67* 55* 38* 31*  CREATININE 1.49* 1.68* 1.28* 1.24 1.20 1.30*  CALCIUM 7.3* 7.8* 8.0* 8.1* 7.7* 7.9*  MG 2.1  --   --   --  2.0  --   PHOS 3.9  --   --   --   --   --    Liver Function Tests:  Recent Labs Lab 07/25/15 0522 07/26/15 0422 07/27/15 0625 07/28/15 0832  AST 27 24 96* 47*  ALT 26 29 77* 69*  ALKPHOS 67 85 156* 135*  BILITOT 0.2* 0.3 0.5 0.6  PROT 4.4* 5.7* 5.7* 6.6  ALBUMIN 1.7* 1.9* 1.8* 2.3*   No results for input(s): LIPASE, AMYLASE in the last 168 hours. No results for input(s): AMMONIA in the last 168 hours. CBC:  Recent Labs Lab 07/26/15 0422 07/27/15 [id number] 07/28/15 [id number] 07/29/15 0644 07/30/15 0636 07/31/15 0603  WBC 7.7 7.0 10.5 9.5 10.3 12.3*  NEUTROABS 5.1  --   --   --   --   --   HGB 8.7* 8.4* 9.4* 9.2* 9.5* 9.8*  HCT 28.7* 26.2* 29.5* 29.2* 30.4* 32.5*  MCV 99.3 100.4* 100.3* 97.7 100.0 99.4  PLT 346 303 311 294 323 325   Cardiac Enzymes:  Recent Labs Lab 07/26/15 0422  TROPONINI 0.16*   BNP (last 3 results)  Recent Labs  07/18/15 1334 07/19/15 1622  BNP 618.1* 1734.1*    ProBNP (last 3 results) No results for input(s): PROBNP in the last 8760 hours.  CBG:  Recent Labs Lab 07/30/15 2057 07/30/15 2316 07/31/15 0004 07/31/15 0456 07/31/15 0840  GLUCAP 155* 191* 155* 79 144*    Micro Recent  Results (from the past 240 hour(s))  Respiratory virus panel     Status: None   Collection Time: 07/21/15  7:45 PM  Result Value Ref Range Status   Respiratory Syncytial Virus A Negative Negative Final   Respiratory Syncytial Virus B Negative Negative Final   Influenza A Negative Negative Final   Influenza B Negative Negative Final   Parainfluenza 1 Negative Negative Final   Parainfluenza 2 Negative Negative Final   Parainfluenza 3 Negative Negative Final   Metapneumovirus Negative Negative Final   Rhinovirus Negative Negative Final   Adenovirus Negative Negative Final    Comment: (NOTE) Performed At: [facility] [address] [place], [place] [id number] Lindon Romp MD [id number]   Culture, respiratory (NON-Expectorated)     Status: None   Collection Time: 07/25/15  3:20 PM  Result Value Ref Range Status   Specimen Description BRONCHIAL ALVEOLAR LAVAGE  Final   Special Requests NONE  Final   Gram Stain   Final    NO WBC SEEN NO SQUAMOUS EPITHELIAL CELLS SEEN NO ORGANISMS SEEN Performed  at [place] Performed at [facility]    Report Status 07/27/2015 FINAL  Final     Studies: Dg Abd Portable 1v  07/30/2015  CLINICAL DATA:  NG tube placement EXAM: PORTABLE ABDOMEN - 1 VIEW COMPARISON:  Portable abdominal film of July 29, 2015 FINDINGS: The feeding tube is been removed and a nasogastric tube placed. However, the proximal port is at or above the GE junction and the tip lies in the cardia. Advancement by 15-20 cm at least is recommended. There is persistent left lower lobe atelectasis or pneumonia. The gas pattern in the upper abdomen is within the limits of normal. Metallic posterior fusion is devices are present from the lower thoracic spine throughout the lumbar spine. IMPRESSION: Advancement of the nasogastric tube by between 15 and 20 cm is needed to assure that the  proximal port is positioned below the GE junction. Left lower lobe atelectasis or pneumonia. Electronically Signed   By: David  Martinique M.D.   On: 07/30/2015 08:01   Dg Abd Portable 1v  07/29/2015  CLINICAL DATA:  Encounter for feeding tube placement. EXAM: PORTABLE ABDOMEN - 1 VIEW COMPARISON:  None. FINDINGS: The bowel gas pattern is normal. Status post surgical fusion of lower thoracic and lumbar spine. Distal tip of feeding tube is seen in expected position of second portion of duodenum. IMPRESSION: Distal tip of feeding tube seen in expected position of second portion of duodenum. Electronically Signed   By: Marijo Conception, M.D.   On: 07/29/2015 15:08    Scheduled Meds: . antiseptic oral rinse  7 mL Mouth Rinse QID  . chlorhexidine gluconate  15 mL Mouth Rinse BID  . digoxin  0.125 mg Intravenous Daily  . diltiazem  90 mg Oral 4 times per day  . feeding supplement (PRO-STAT SUGAR FREE 64)  30 mL Per Tube Daily  . folic acid  1 mg Per Tube Daily  . free water  300 mL Per Tube Q6H  . hydrocortisone sod succinate (SOLU-CORTEF) inj  50 mg Intravenous Q12H  . insulin aspart  0-20 Units Subcutaneous 6 times per day  . insulin aspart  3 Units Subcutaneous 6 times per day  . insulin NPH Human  15 Units Subcutaneous BID AC & HS  . levalbuterol  0.63 mg Nebulization Q6H  . pantoprazole sodium  40 mg Per Tube Daily  . polyethylene glycol  17 g Per Tube Daily  . potassium chloride  60 mEq Per Tube TID  . sodium chloride flush  10-40 mL Intracatheter Q12H   Continuous Infusions: . esmolol Stopped (07/29/15 1655)  . feeding supplement (JEVITY 1.2 CAL) 1,000 mL (07/30/15 2126)  . heparin 1,600 Units/hr (07/31/15 0934)       Time spent: 35 minutes    [facility] A  [facility] Pager [phone] If 7PM-7AM, please contact night-coverage at [email or web address], password [facility] 07/31/2015, 11:05 AM  LOS: 14 days

## 2015-07-31 NOTE — Procedures (Signed)
[facility] called about HFNC and order to hold based off of ABG results 7.519/38.7/78/31.3/95.8%

## 2015-07-31 NOTE — Progress Notes (Signed)
Speech Language Pathology Treatment: Dysphagia  Patient Details Name: CAYD REVEL MRN: [id number] DOB: 12-09-1940 Today's Date: 07/31/2015 Time: [id number] SLP Time Calculation (min) (ACUTE ONLY): 26 min  Assessment / Plan / Recommendation Clinical Impression  Therapeutic and diagnostic po trials provided to facilitate use of swallowing musculature and determine potential for instrumental swallowing evaluation. Patient lethargic and deconditioned but arouses easily and cooperative, communicative. Vocal quality remains hoarse and with low intensity. Oral care complete with clearance of thick oral secretions which are suspected to be located pharyngeally as well. Ice chip trials elicited delayed but present oral transit of bolus with eventual initiation of swallow and evidence of decreased airway protection characterized by increasing wet vocal quality, throat clearing, and coughing (weak and inefficient to clear the airway). Patient making slow but steady gains in function however not yet ready for instrumental testing. Education complete with patient and son regarding progress and potential which remains good for ability to resume pos as overall clinical picture/strength improves. *Note consistent facial grimacing during swallow. Although patient denies pain, suspect that large bore feeding tube contributing to discomfort as well as increased difficulty clearing secretions and pos pharyngeally. Recommend consideration of replacement with smaller bore feeding tube to facilitate improved pharyngeal abilities and increased airway protection. SLP will f/u.    HPI HPI: 31 yom who is s/p recent lumbar surg on 2/2. Following d/c on 2/4 developed acute confusion and worsening weakness w/ fever > 102. He was brought to the ER. Admitted w/ working dx of sepsis felt d/t PNA +/- UTI. His hospital course was c/b intermittent fever, AF w/ RVR, hypoxia and confusion. He was treated w/ IVFs and empiric abx. On 2/7  apparently had been doing better. Evalauted by SLP no signs of aspiration, kept on regualr diet, thin liquids with basic precautions. On 2/8 developed increased SOB, for which he was placed on NIPPV, this was followed again by fever and then worsening agitation. on 2/8 he was intubated after his agitation escalated to point he would no longer keep his BIPAP on, worsening CHF. Extubated 2/15.       SLP Plan  Continue with current plan of care     Recommendations  Diet recommendations: NPO Medication Administration: Via alternative means (consider smaller bore feeding tube)             Oral Care Recommendations: Oral care BID Follow up Recommendations: Inpatient Rehab Plan: Continue with current plan of care     [place], [place] [phone]    Addis Bennie Meryl 07/31/2015, 9:22 AM

## 2015-08-01 DIAGNOSIS — M549 Dorsalgia, unspecified: Secondary | ICD-10-CM

## 2015-08-01 DIAGNOSIS — R0600 Dyspnea, unspecified: Secondary | ICD-10-CM

## 2015-08-01 DIAGNOSIS — I481 Persistent atrial fibrillation: Secondary | ICD-10-CM

## 2015-08-01 LAB — COMPREHENSIVE METABOLIC PANEL
ALBUMIN: 2.3 g/dL — AB (ref 3.5–5.0)
ALK PHOS: 97 U/L (ref 38–126)
ALT: 46 U/L (ref 17–63)
ANION GAP: 10 (ref 5–15)
AST: 38 U/L (ref 15–41)
BILIRUBIN TOTAL: 0.7 mg/dL (ref 0.3–1.2)
BUN: 28 mg/dL — ABNORMAL HIGH (ref 6–20)
CALCIUM: 8.2 mg/dL — AB (ref 8.9–10.3)
CO2: 32 mmol/L (ref 22–32)
CREATININE: 1.27 mg/dL — AB (ref 0.61–1.24)
Chloride: 109 mmol/L (ref 101–111)
GFR calc non Af Amer: 53 mL/min — ABNORMAL LOW (ref >60)
GLUCOSE: 104 mg/dL — AB (ref 65–99)
Potassium: 4.4 mmol/L (ref 3.5–5.1)
Sodium: 151 mmol/L — ABNORMAL HIGH (ref 135–145)
TOTAL PROTEIN: 6.1 g/dL — AB (ref 6.5–8.1)

## 2015-08-01 LAB — CBC
HCT: 33.7 % — ABNORMAL LOW (ref 39.0–52.0)
HEMATOCRIT: 33.1 % — AB (ref 39.0–52.0)
HEMOGLOBIN: 10.1 g/dL — AB (ref 13.0–17.0)
Hemoglobin: 10 g/dL — ABNORMAL LOW (ref 13.0–17.0)
MCH: 29.6 pg (ref 26.0–34.0)
MCH: 31.3 pg (ref 26.0–34.0)
MCHC: 29.7 g/dL — ABNORMAL LOW (ref 30.0–36.0)
MCHC: 30.5 g/dL (ref 30.0–36.0)
MCV: 102.5 fL — ABNORMAL HIGH (ref 78.0–100.0)
MCV: 99.7 fL (ref 78.0–100.0)
Platelets: 282 10*3/uL (ref 150–400)
Platelets: 307 10*3/uL (ref 150–400)
RBC: 3.23 MIL/uL — ABNORMAL LOW (ref 4.22–5.81)
RBC: 3.38 MIL/uL — AB (ref 4.22–5.81)
RDW: 17.3 % — ABNORMAL HIGH (ref 11.5–15.5)
RDW: 17.6 % — ABNORMAL HIGH (ref 11.5–15.5)
WBC: 10.9 10*3/uL — ABNORMAL HIGH (ref 4.0–10.5)
WBC: 9.9 10*3/uL (ref 4.0–10.5)

## 2015-08-01 LAB — LACTIC ACID, PLASMA
Lactic Acid, Venous: 1.5 mmol/L (ref 0.5–2.0)
Lactic Acid, Venous: 1 mmol/L (ref 0.5–2.0)

## 2015-08-01 LAB — HEPARIN LEVEL (UNFRACTIONATED): Heparin Unfractionated: 0.63 IU/mL (ref 0.30–0.70)

## 2015-08-01 LAB — GLUCOSE, CAPILLARY
GLUCOSE-CAPILLARY: 222 mg/dL — AB (ref 65–99)
GLUCOSE-CAPILLARY: 223 mg/dL — AB (ref 65–99)
GLUCOSE-CAPILLARY: 199 mg/dL — AB (ref 65–99)
Glucose-Capillary: 209 mg/dL — ABNORMAL HIGH (ref 65–99)
Glucose-Capillary: 233 mg/dL — ABNORMAL HIGH (ref 65–99)
Glucose-Capillary: 96 mg/dL (ref 65–99)

## 2015-08-01 LAB — BASIC METABOLIC PANEL
ANION GAP: 8 (ref 5–15)
BUN: 30 mg/dL — ABNORMAL HIGH (ref 6–20)
CALCIUM: 7.9 mg/dL — AB (ref 8.9–10.3)
CO2: 32 mmol/L (ref 22–32)
Chloride: 106 mmol/L (ref 101–111)
Creatinine, Ser: 1.43 mg/dL — ABNORMAL HIGH (ref 0.61–1.24)
GFR calc Af Amer: 53 mL/min — ABNORMAL LOW (ref >60)
GFR calc non Af Amer: 46 mL/min — ABNORMAL LOW (ref >60)
GLUCOSE: 260 mg/dL — AB (ref 65–99)
POTASSIUM: 4.5 mmol/L (ref 3.5–5.1)
SODIUM: 146 mmol/L — AB (ref 135–145)

## 2015-08-01 LAB — URINALYSIS, ROUTINE W REFLEX MICROSCOPIC
Bilirubin Urine: NEGATIVE
GLUCOSE, UA: NEGATIVE mg/dL
Ketones, ur: NEGATIVE mg/dL
LEUKOCYTES UA: NEGATIVE
NITRITE: NEGATIVE
PROTEIN: 30 mg/dL — AB
Specific Gravity, Urine: 1.014 (ref 1.005–1.030)
pH: 7 (ref 5.0–8.0)

## 2015-08-01 LAB — URINE MICROSCOPIC-ADD ON: Bacteria, UA: NONE SEEN

## 2015-08-01 LAB — BRAIN NATRIURETIC PEPTIDE: B NATRIURETIC PEPTIDE 5: 881.4 pg/mL — AB (ref 0.0–100.0)

## 2015-08-01 LAB — AMMONIA: Ammonia: 28 umol/L (ref 9–35)

## 2015-08-01 MED ORDER — ACETAMINOPHEN 325 MG PO TABS
650.0000 mg | ORAL_TABLET | ORAL | Status: DC | PRN
  Administered 2015-08-01: 650 mg via ORAL
  Filled 2015-08-01: qty 2

## 2015-08-01 MED ORDER — FUROSEMIDE 10 MG/ML IJ SOLN
40.0000 mg | Freq: Once | INTRAMUSCULAR | Status: AC
  Administered 2015-08-01: 40 mg via INTRAVENOUS
  Filled 2015-08-01: qty 4

## 2015-08-01 MED ORDER — FUROSEMIDE 10 MG/ML IJ SOLN
60.0000 mg | Freq: Two times a day (BID) | INTRAMUSCULAR | Status: DC
  Administered 2015-08-01 – 2015-08-02 (×4): 60 mg via INTRAVENOUS
  Filled 2015-08-01 (×4): qty 6

## 2015-08-01 MED ORDER — ACETYLCYSTEINE 20 % IN SOLN
4.0000 mL | Freq: Three times a day (TID) | RESPIRATORY_TRACT | Status: DC
  Administered 2015-08-01 – 2015-08-03 (×6): 4 mL via RESPIRATORY_TRACT
  Filled 2015-08-01 (×8): qty 4

## 2015-08-01 MED ORDER — SODIUM CHLORIDE 0.9 % IV SOLN
1.0000 g | Freq: Two times a day (BID) | INTRAVENOUS | Status: DC
  Administered 2015-08-01 – 2015-08-03 (×5): 1 g via INTRAVENOUS
  Filled 2015-08-01 (×6): qty 1

## 2015-08-01 MED ORDER — GUAIFENESIN 100 MG/5ML PO SOLN
10.0000 mL | ORAL | Status: DC
Start: 2015-08-01 — End: 2015-08-03
  Administered 2015-08-01 – 2015-08-03 (×13): 200 mg via ORAL
  Filled 2015-08-01 (×19): qty 10

## 2015-08-01 MED ORDER — VANCOMYCIN HCL IN DEXTROSE 750-5 MG/150ML-% IV SOLN
750.0000 mg | INTRAVENOUS | Status: DC
  Administered 2015-08-01 – 2015-08-03 (×3): 750 mg via INTRAVENOUS
  Filled 2015-08-01 (×3): qty 150

## 2015-08-01 NOTE — Progress Notes (Signed)
[facility] PROGRESS NOTE   Curtis Frazier [id number] DOB: 1940-11-24 DOA: 07/17/2015 PCP: Curtis Pardon, MD  Subjective: Seen with son and 2 daughters at bedside. Appears to be appropriate, stable atrial fibrillation with RVR, spike fever last night, and cultured. Back on antibiotics.  HPI: 75 yo who is s/p recent lumbar surgery on 2/2. Following d/c on 2/4 developed acute confusion and worsening weakness w/ fever > 102. He was brought to the ER 07/17/15 and admitted w/ working dx of sepsis felt due to PNA. Course complicated by shock, respiratory failure requiring intubation, and atrial fibrillation with RVR. Extubated on 07/28/2015.  Assessment/Plan: Principal Problem:   Sepsis ([place]) Active Problems:   Essential hypertension, benign   Coronary atherosclerosis   Rheumatoid arthritis (HCC)   Acute encephalopathy   LLL pneumonia   Type II or unspecified type diabetes mellitus without mention of complication, not stated as uncontrolled   S/P lumbar laminectomy   Elevated troponin   Acute respiratory failure with hypoxia (HCC)   Acute pulmonary edema (HCC)   HCAP (healthcare-associated pneumonia)   Palliative care encounter   DNR (do not resuscitate) discussion   Acute respiratory failure ([place])   Endotracheally intubated   CHF (congestive heart failure) ([place])   Change in mental state   Advanced care planning/counseling discussion   Pneumonia   PAF (paroxysmal atrial fibrillation) ([place])   Counseling regarding advanced care planning and goals of care   Disorientation   Slow transit constipation   Back pain without radiation    Acute hypoxic respiratory failure Acute respiratory failure with hypoxia, ventilator dependent. Patient extubated on 07/28/2015. Patient was under PCCM until 2/16 transferred [facility] further evaluation. Currently on Ventimask.  Sepsis Evident on admission, likely secondary to left lower lobe pneumonia. Patient has a recent lumbar  laminectomy, MR of the brain showed no evidence of infection. He has some fluid collection likely postoperative seroma, radiology finding consistent with arachnoiditis.   Acute on chronic CHF Patient has acute CHF with preserved LVEF of 65%. Still has some fluid overload, continue Lasix. Chest x-ray still showing edema,has a lot of fluids intake secondary to multiple drips and tube feeding. Started on scheduled Lasix, continue to follow renal function.   Atrial fibrillation with rapid ventricular response Appreciate cardiology consultation, patient is on amiodarone, diltiazem, digoxin and metoprolol as needed. Rate is not controlled. On heparin drip.  LLL pneumonia  Currently on antibiotics. Continue deep suctioned to remove secretions.  Hypernatremia On free water flushes, improving, sodium is 146.  Hypokalemia Replete with oral supplements, magnesium is okay at 2.0.  CK stage III Creatinine baseline is 1.5, around baseline for now.  Status post lumbar laminectomy Has had recent back surgery, according to notes from neurosurgery no evidence of infection. Patient anyway is on broad-spectrum antibiotics.  Rheumatoid arthritis and PMR Patient is on methotrexate and 5 mg of prednisone at home. Continue Solu-Cortef for now, will switched to prednisone in 1-2 days.   Code Status: Full Code Family Communication: Plan discussed with the patient. Disposition Plan: Remains inpatient Diet:    Consultants:  Patient transferred to [facility] service on 07/30/2015  Procedures:  None  Antibiotics:  Meropenem and vancomycin   Objective: Filed Vitals:   08/01/15 0749 08/01/15 1300  BP: 111/74 124/89  Pulse: 105 120  Temp: 100.9 F (38.3 C) 99.2 F (37.3 C)  Resp: 23 14    Intake/Output Summary (Last 24 hours) at 08/01/15 1357 Last data filed at 08/01/15 1300  Gross per  24 hour  Intake 2442.9 ml  Output   5125 ml  Net -2682.1 ml   Filed Weights   07/30/15 0250  07/31/15 0452 08/01/15 0500  Weight: 93.2 kg (205 lb 7.5 oz) 89.7 kg (197 lb 12 oz) 90.1 kg (198 lb 10.2 oz)    Exam: General: Alert and awake, oriented x3, not in any acute distress. HEENT: anicteric sclera, pupils reactive to light and accommodation, EOMI CVS: S1-S2 clear, no murmur rubs or gallops Chest: clear to auscultation bilaterally, no wheezing, rales or rhonchi Abdomen: soft nontender, nondistended, normal bowel sounds, no organomegaly Extremities: no cyanosis, clubbing or edema noted bilaterally Neuro: Cranial nerves II-XII intact, no focal neurological deficits  Data Reviewed: Basic Metabolic Panel:  Recent Labs Lab 07/27/15 0625  07/29/15 1732 07/30/15 1045 07/31/15 0603 08/01/15 0021 08/01/15 0524  NA 143  < > 155* 153* 149* 151* 146*  K 4.6  < > 3.1* 3.1* 3.2* 4.4 4.5  CL 108  < > 115* 116* 106 109 106  CO2 24  < > 26 25 31  32 32  GLUCOSE 242*  < > 127* 183* 90 104* 260*  BUN 79*  < > 55* 38* 31* 28* 30*  CREATININE 1.49*  < > 1.24 1.20 1.30* 1.27* 1.43*  CALCIUM 7.3*  < > 8.1* 7.7* 7.9* 8.2* 7.9*  MG 2.1  --   --  2.0  --   --   --   PHOS 3.9  --   --   --   --   --   --   < > = values in this interval not displayed. Liver Function Tests:  Recent Labs Lab 07/26/15 0422 07/27/15 0625 07/28/15 0832 08/01/15 0021  AST 24 96* 47* 38  ALT 29 77* 69* 46  ALKPHOS 85 156* 135* 97  BILITOT 0.3 0.5 0.6 0.7  PROT 5.7* 5.7* 6.6 6.1*  ALBUMIN 1.9* 1.8* 2.3* 2.3*   No results for input(s): LIPASE, AMYLASE in the last 168 hours.  Recent Labs Lab 08/01/15 0022  AMMONIA 28   CBC:  Recent Labs Lab 07/26/15 0422  07/29/15 0644 07/30/15 0636 07/31/15 0603 08/01/15 0021 08/01/15 0524  WBC 7.7  < > 9.5 10.3 12.3* 10.9* 9.9  NEUTROABS 5.1  --   --   --   --   --   --   HGB 8.7*  < > 9.2* 9.5* 9.8* 10.1* 10.0*  HCT 28.7*  < > 29.2* 30.4* 32.5* 33.1* 33.7*  MCV 99.3  < > 97.7 100.0 99.4 102.5* 99.7  PLT 346  < > 294 323 325 307 282  < > = values in this  interval not displayed. Cardiac Enzymes:  Recent Labs Lab 07/26/15 0422 08/01/15 0021  TROPONINI 0.16* 0.51*   BNP (last 3 results)  Recent Labs  07/18/15 1334 07/19/15 1622 08/01/15 0022  BNP 618.1* 1734.1* 881.4*    ProBNP (last 3 results) No results for input(s): PROBNP in the last 8760 hours.  CBG:  Recent Labs Lab 07/31/15 2059 08/01/15 0042 08/01/15 0448 08/01/15 0747 08/01/15 1331  GLUCAP 195* 96 222* 223* 233*    Micro Recent Results (from the past 240 hour(s))  Culture, respiratory (NON-Expectorated)     Status: None   Collection Time: 07/25/15  3:20 PM  Result Value Ref Range Status   Specimen Description BRONCHIAL ALVEOLAR LAVAGE  Final   Special Requests NONE  Final   Gram Stain   Final    NO WBC SEEN NO SQUAMOUS  EPITHELIAL CELLS SEEN NO ORGANISMS SEEN Performed at [facility]    Culture   Final    FEW YEAST CONSISTENT WITH CANDIDA SPECIES Performed at [facility]    Report Status 07/27/2015 FINAL  Final     Studies: Dg Chest Port 1 View  07/31/2015  CLINICAL DATA:  Dyspnea EXAM: PORTABLE CHEST 1 VIEW COMPARISON:  07/29/2015 FINDINGS: Shallow inspiration. Cardiac enlargement with mild pulmonary vascular congestion. Diffuse peripheral interstitial pattern to the lungs with hazy infiltration in the lung bases likely due to edema. Probable small left pleural effusion. Mild progression since previous study. An enteric tube is been placed. The tip is off the field of view but below the left hemidiaphragm. Right PICC line is unchanged in position with tip over the low SVC. No pneumothorax. Calcified and tortuous aorta. Postoperative changes in the thoracic and upper lumbar spine. Probable thoracolumbar scoliosis with convexity towards the right. IMPRESSION: Cardiac enlargement with pulmonary vascular congestion and pulmonary edema, progressing since previous study. Electronically Signed   By: Lucienne Capers M.D.   On: 07/31/2015 22:30      Scheduled Meds: . acetylcysteine  4 mL Nebulization TID  . antiseptic oral rinse  7 mL Mouth Rinse QID  . chlorhexidine gluconate  15 mL Mouth Rinse BID  . digoxin  0.125 mg Intravenous Daily  . diltiazem  90 mg Oral 4 times per day  . feeding supplement (PRO-STAT SUGAR FREE 64)  30 mL Per Tube Daily  . folic acid  1 mg Per Tube Daily  . free water  300 mL Per Tube Q6H  . furosemide  60 mg Intravenous Q12H  . guaiFENesin  10 mL Oral Q4H  . hydrocortisone sod succinate (SOLU-CORTEF) inj  50 mg Intravenous Q12H  . insulin aspart  0-20 Units Subcutaneous 6 times per day  . insulin aspart  3 Units Subcutaneous 6 times per day  . levalbuterol  0.63 mg Nebulization Q6H  . meropenem (MERREM) IV  1 g Intravenous Q12H  . pantoprazole sodium  40 mg Per Tube Daily  . polyethylene glycol  17 g Per Tube Daily  . sodium chloride flush  10-40 mL Intracatheter Q12H  . vancomycin  750 mg Intravenous Q24H   Continuous Infusions: . amiodarone 30 mg/hr (08/01/15 [id number])  . feeding supplement (JEVITY 1.2 CAL) 1,000 mL (08/01/15 0618)  . heparin 1,550 Units/hr (08/01/15 1309)       Time spent: 35 minutes    [facility] A  [facility] Pager [phone] If 7PM-7AM, please contact night-coverage at [email or web address], password [facility] 08/01/2015, 1:57 PM  LOS: 15 days

## 2015-08-01 NOTE — Progress Notes (Addendum)
SUBJECTIVE: Remains ill.  Still in AF with RVR though BP is much better.  Marland Kitchen acetylcysteine  4 mL Nebulization TID  . antiseptic oral rinse  7 mL Mouth Rinse QID  . chlorhexidine gluconate  15 mL Mouth Rinse BID  . digoxin  0.125 mg Intravenous Daily  . diltiazem  90 mg Oral 4 times per day  . feeding supplement (PRO-STAT SUGAR FREE 64)  30 mL Per Tube Daily  . folic acid  1 mg Per Tube Daily  . free water  300 mL Per Tube Q6H  . furosemide  60 mg Intravenous Q12H  . guaiFENesin  10 mL Oral Q4H  . hydrocortisone sod succinate (SOLU-CORTEF) inj  50 mg Intravenous Q12H  . insulin aspart  0-20 Units Subcutaneous 6 times per day  . insulin aspart  3 Units Subcutaneous 6 times per day  . levalbuterol  0.63 mg Nebulization Q6H  . meropenem (MERREM) IV  1 g Intravenous Q12H  . pantoprazole sodium  40 mg Per Tube Daily  . polyethylene glycol  17 g Per Tube Daily  . sodium chloride flush  10-40 mL Intracatheter Q12H  . vancomycin  750 mg Intravenous Q24H   . amiodarone 30 mg/hr (08/01/15 [id number])  . feeding supplement (JEVITY 1.2 CAL) 1,000 mL (08/01/15 0618)  . heparin 1,550 Units/hr (08/01/15 0806)    OBJECTIVE: Physical Exam: Filed Vitals:   08/01/15 0400 08/01/15 0500 08/01/15 0749 08/01/15 0924  BP:   111/74   Pulse:   105   Temp: 98.9 F (37.2 C)  100.9 F (38.3 C)   TempSrc: Axillary  Axillary   Resp:   23   Height:      Weight:  198 lb 10.2 oz (90.1 kg)    SpO2:   93% 94%    Intake/Output Summary (Last 24 hours) at 08/01/15 1209 Last data filed at 08/01/15 1100  Gross per 24 hour  Intake 2540.6 ml  Output   5545 ml  Net -3004.4 ml    Telemetry reveals afib, V rates 110s-130s  GEN- The patient is elderly and chronically ill appearing, alert and oriented x 3 today.   Head- normocephalic, atraumatic Eyes-  Sclera clear, conjunctiva pink Ears- hearing intact Oropharynx- NGT in place Neck- supple  Lungs- very coarse upper airway breath sounds Heart- tachycardic  irregular rhythm GI- distended, tender Extremities- no clubbing, cyanosis, + dependant edema Skin- no rash or lesion Psych- euthymic mood, full affect Neuro- strength and sensation are intact  LABS: Basic Metabolic Panel:  Recent Labs  07/30/15 1045  08/01/15 0021 08/01/15 0524  NA 153*  < > 151* 146*  K 3.1*  < > 4.4 4.5  CL 116*  < > 109 106  CO2 25  < > 32 32  GLUCOSE 183*  < > 104* 260*  BUN 38*  < > 28* 30*  CREATININE 1.20  < > 1.27* 1.43*  CALCIUM 7.7*  < > 8.2* 7.9*  MG 2.0  --   --   --   < > = values in this interval not displayed. CBC:  Recent Labs  08/01/15 0021 08/01/15 0524  WBC 10.9* 9.9  HGB 10.1* 10.0*  HCT 33.1* 33.7*  MCV 102.5* 99.7  PLT 307 282    ASSESSMENT AND PLAN:   1.  Atrial fibrillation with RVR:  Likely due to underlying medical illness Continue IV amiodarone and oral cardizem  Tolerating IV heparin.  Would favor oral anticoagulation once clinically improved and then  TEE guided cardioversion chads2vasc score is at least 5.  Should anticoagulate long term if able.     2. HTN Sepsis is improved and BP has increased  3. CAD: known h/o CAD-s/p CFX BMS 2010, Myoview low risk Nov 2016, no active ischemia   4. CRI stage 3 Stable No change required today  5. Hx of PMR requiring chronic steroids, currently on stress dose steroids  6. Hx of GI bleed 2014- AVMs As above  Prognosis is currently guarded.  Family is very anxious and at bedside.  They plan to speak to hospitalist about transfer to [facility].  I worry that they may not appreciate the severity of patient's condition.   Thompson Grayer, MD 08/01/2015 12:09 PM

## 2015-08-01 NOTE — Progress Notes (Signed)
Patient is on a venturi mask. Per previous RT patient's ABG revealed patients O2 saturation did not need the high flow nasal cannula. That RT notified MD and was told venturi mask was fine. RT will continue to monitor.

## 2015-08-01 NOTE — Progress Notes (Signed)
Speech Language Pathology Treatment: Dysphagia  Patient Details Name: Curtis Frazier MRN: [id number] DOB: May 26, 1941 Today's Date: 08/01/2015 Time: [id number] SLP Time Calculation (min) (ACUTE ONLY): 23 min  Assessment / Plan / Recommendation Clinical Impression  ST follow up for PO readiness.  Oral care was initially provided and the patient's oral cavity was noted to be dry and coated with a thick whitish material.  Attempts were made to remove but were not completely successful.  The patient was unable to cough on command, however, when we were repositioning him cough was observed.  Cough is very weak and inneffective to clear secretions.  The patient was presented with an ice chip and no attempts were made to manipulate the material and the patient was eventually orally suctioned to remove the material from the oral cavity.   Currently the patient is not ready for an objective measure.  Recommend continue with alternate feeding/medication methods.  The family reported that a smaller bore feeding tube was going to be place tomorrow under fluoro.  The family had many questions regarding his swallow function and all questions were answered.  ST to follow up tomorrow to re assess the patient readiness.   HPI HPI: 52 yom who is s/p recent lumbar surg on 2/2. Following d/c on 2/4 developed acute confusion and worsening weakness w/ fever > 102. He was brought to the ER. Admitted w/ working dx of sepsis felt d/t PNA +/- UTI. His hospital course was c/b intermittent fever, AF w/ RVR, hypoxia and confusion. He was treated w/ IVFs and empiric abx. On 2/7 apparently had been doing better. Evalauted by SLP no signs of aspiration, kept on regualr diet, thin liquids with basic precautions. On 2/8 developed increased SOB, for which he was placed on NIPPV, this was followed again by fever and then worsening agitation. on 2/8 he was intubated after his agitation escalated to point he would no longer keep his BIPAP on,  worsening CHF. Extubated 2/15.       SLP Plan  Continue with current plan of care     Recommendations  Diet recommendations: NPO Medication Administration: Via alternative means             Oral Care Recommendations: Oral care QID Follow up Recommendations: Inpatient Rehab Plan: Continue with current plan of care     [place], MA, [facility] SLP [phone] Lamar Sprinkles 08/01/2015, 8:46 AM

## 2015-08-01 NOTE — Progress Notes (Signed)
Pharmacy Antibiotic Note  Curtis Frazier is a 75 y.o. male admitted on 07/17/2015 with sepsisPharmacy has been consulted for meropenem and vancomycin dosing.  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  Pharmacy reconsulted on 2/19 to restart abx due to low grade fever. Last vancomycin dose was 750mg  IV Q24 due to high troughs on larger doses. Tmax of 100.9, WBC wnl. SCr up to 1.43, CrCl 90ml/min. UOP good at 2.8ml/kg/hr.  Plan: Start meropenem 1g IV Q12 Start vancomycin 750mg  IV Q24 Monitor clinical picture, renal function, VT at Css F/U C&S, abx deescalation / LOT    Height: 5\' 8"  (172.7 cm) Weight: 198 lb 10.2 oz (90.1 kg) IBW/kg (Calculated) : 68.4  Temp (24hrs), Avg:100 F (37.8 C), Min:98.9 F (37.2 C), Max:100.9 F (38.3 C)   Recent Labs Lab 07/26/15 0422  07/29/15 0644 07/29/15 1732 07/30/15 0636 07/30/15 1045 07/31/15 0603 08/01/15 0021 08/01/15 0022 08/01/15 0524 08/01/15 0525  WBC 7.7  < > 9.5  --  10.3  --  12.3* 10.9*  --  9.9  --   CREATININE 1.51*  < > 1.28* 1.24  --  1.20 1.30* 1.27*  --  1.43*  --   LATICACIDVEN  --   --   --   --   --   --   --   --  1.5  --  1.0  VANCOTROUGH 33*  --   --   --   --   --   --   --   --   --   --   < > = values in this interval not displayed.  Estimated Creatinine Clearance: 47.9 mL/min (by C-G formula based on Cr of 1.43).    Allergies  Allergen Reactions  . Ace Inhibitors Other (See Comments)    Causes high K  . Angiotensin Receptor Blockers Other (See Comments)    Causes high K  . Metoprolol Other (See Comments)    May cause elevated K level  . Penicillins Swelling and Rash  . Tetracycline Swelling and Rash    Antimicrobials this admission: Meropenem 2/13 >> 2/17; 2/19 >> Vanc 2/4 >> 2/17; 2/19 >> Azithromycin 2/9 >> 2/13 Ampicillin 2/4>>2/5 Ceftriaxone 2/4>>2/4 Cefepime 2/5 >> 2/13  Dose adjustments this  admission: * 2/7 VT = 19 on 750 mg IV q12h * 2/13 VT 33 (drawn early), may be closer to 28 - decrease to 750 mg IV q24h  Microbiology results: 2/4 MRSA PCR Nasal Neg 2/4 BCx2: neg 2/4 UCx: mult species 2/8 PCP smear: neg 2/8 CMV pcr: neg 2/8 Fungal cx: ngtd 2/8 AFB cx: ngtd 2/8 RSV: negative 2/8 Bronch: ngtd 2/10 strep pneumo neg 2/10 legionella neg 2/8 Viral Panel Neg 2/12 BAL: few candida, neg 2/19 Blood cx > sent 2/19 Urine cx > sent  Thank you for allowing pharmacy to be a part of this patient's care.  Elenor Quinones, PharmD, BCPS Clinical Pharmacist Pager [phone] 08/01/2015 8:55 AM

## 2015-08-01 NOTE — Progress Notes (Signed)
ANTICOAGULATION CONSULT NOTE  Pharmacy Consult:  Heparin Indication: atrial fibrillation  Allergies  Allergen Reactions  . Ace Inhibitors Other (See Comments)    Causes high K  . Angiotensin Receptor Blockers Other (See Comments)    Causes high K  . Metoprolol Other (See Comments)    May cause elevated K level  . Penicillins Swelling and Rash  . Tetracycline Swelling and Rash    Patient Measurements: Height: 5\' 8"  (172.7 cm) Weight: 198 lb 10.2 oz (90.1 kg) IBW/kg (Calculated) : 68.4 Heparin Dosing Weight: 89 kg  Vital Signs: Temp: 100.9 F (38.3 C) (02/19 0749) Temp Source: Axillary (02/19 0749) BP: 111/74 mmHg (02/19 0749) Pulse Rate: 105 (02/19 0749)  Labs:  Recent Labs  07/30/15 0636  07/31/15 0603 08/01/15 0021 08/01/15 0524  HGB 9.5*  --  9.8* 10.1* 10.0*  HCT 30.4*  --  32.5* 33.1* 33.7*  PLT 323  --  325 307 282  HEPARINUNFRC 0.69  --  0.67  --  0.63  CREATININE  --   < > 1.30* 1.27* 1.43*  TROPONINI  --   --   --  0.51*  --   < > = values in this interval not displayed.  Estimated Creatinine Clearance: 47.9 mL/min (by C-G formula based on Cr of 1.43).    Assessment: 45 YOM continues on IV heparin for new onset afib, CHADSVASc 5, HL was therapeutic x 5 on 1750 units/hr, then INR/aPTT jumped and hep stopped 2/13; restarted hep 2/16, HL has been borderline high with last one down to 0.63. Hgb stable at 10, plts wnl.  Goal of Therapy:  Heparin level 0.3-0.7 units/ml Monitor platelets by anticoagulation protocol: Yes   Plan:  Decrease heparin gtt slightly to 1550 units/hr Monitor daily HL, CBC, s/s of bleed F/U long-term [facility] plan  Elenor Quinones, PharmD, [facility] Clinical Pharmacist Pager [phone] 08/01/2015 7:52 AM

## 2015-08-01 NOTE — Progress Notes (Signed)
RT NTS patient. Moderate amount of thick green/tan secretions were obtained out of pt's left nare. RT will continue to monitor.

## 2015-08-02 ENCOUNTER — Inpatient Hospital Stay (HOSPITAL_COMMUNITY): Payer: Medicare Other

## 2015-08-02 LAB — CBC
HCT: 33.4 % — ABNORMAL LOW (ref 39.0–52.0)
HEMOGLOBIN: 10 g/dL — AB (ref 13.0–17.0)
MCH: 29.6 pg (ref 26.0–34.0)
MCHC: 29.9 g/dL — AB (ref 30.0–36.0)
MCV: 98.8 fL (ref 78.0–100.0)
Platelets: 295 10*3/uL (ref 150–400)
RBC: 3.38 MIL/uL — AB (ref 4.22–5.81)
RDW: 16.8 % — ABNORMAL HIGH (ref 11.5–15.5)
WBC: 10.4 10*3/uL (ref 4.0–10.5)

## 2015-08-02 LAB — BASIC METABOLIC PANEL
ANION GAP: 12 (ref 5–15)
BUN: 30 mg/dL — ABNORMAL HIGH (ref 6–20)
CALCIUM: 8.5 mg/dL — AB (ref 8.9–10.3)
CHLORIDE: 100 mmol/L — AB (ref 101–111)
CO2: 33 mmol/L — ABNORMAL HIGH (ref 22–32)
CREATININE: 1.49 mg/dL — AB (ref 0.61–1.24)
GFR calc non Af Amer: 44 mL/min — ABNORMAL LOW (ref >60)
GFR, EST AFRICAN AMERICAN: 51 mL/min — AB (ref >60)
Glucose, Bld: 244 mg/dL — ABNORMAL HIGH (ref 65–99)
Potassium: 3.3 mmol/L — ABNORMAL LOW (ref 3.5–5.1)
SODIUM: 145 mmol/L (ref 135–145)

## 2015-08-02 LAB — HEPARIN LEVEL (UNFRACTIONATED): HEPARIN UNFRACTIONATED: 0.5 [IU]/mL (ref 0.30–0.70)

## 2015-08-02 LAB — GLUCOSE, CAPILLARY
GLUCOSE-CAPILLARY: 172 mg/dL — AB (ref 65–99)
GLUCOSE-CAPILLARY: 185 mg/dL — AB (ref 65–99)
GLUCOSE-CAPILLARY: 208 mg/dL — AB (ref 65–99)
GLUCOSE-CAPILLARY: 249 mg/dL — AB (ref 65–99)
GLUCOSE-CAPILLARY: 288 mg/dL — AB (ref 65–99)
GLUCOSE-CAPILLARY: 292 mg/dL — AB (ref 65–99)

## 2015-08-02 LAB — URINE CULTURE: CULTURE: NO GROWTH

## 2015-08-02 LAB — TROPONIN I: Troponin I: 0.51 ng/mL (ref <0.031)

## 2015-08-02 MED ORDER — DILTIAZEM HCL 60 MG PO TABS
90.0000 mg | ORAL_TABLET | Freq: Four times a day (QID) | ORAL | Status: DC
  Administered 2015-08-02 – 2015-08-03 (×7): 90 mg via ORAL
  Filled 2015-08-02 (×6): qty 1

## 2015-08-02 MED ORDER — POTASSIUM CHLORIDE 20 MEQ/15ML (10%) PO SOLN: 60.0000 meq | Freq: Four times a day (QID) | ORAL | Status: DC

## 2015-08-02 MED ORDER — INSULIN GLARGINE 100 UNIT/ML ~~LOC~~ SOLN
10.0000 [IU] | Freq: Every day | SUBCUTANEOUS | Status: DC
Start: 2015-08-02 — End: 2015-08-03
  Administered 2015-08-02 – 2015-08-03 (×2): 10 [IU] via SUBCUTANEOUS
  Filled 2015-08-02 (×2): qty 0.1

## 2015-08-02 MED ORDER — STARCH (THICKENING) PO POWD
ORAL | Status: DC | PRN
  Filled 2015-08-02: qty 227

## 2015-08-02 MED ORDER — FREE WATER
300.0000 mL | Freq: Three times a day (TID) | Status: DC
  Administered 2015-08-02: 300 mL

## 2015-08-02 MED ORDER — METOPROLOL TARTRATE 25 MG/10 ML ORAL SUSPENSION
12.5000 mg | Freq: Two times a day (BID) | ORAL | Status: DC
  Administered 2015-08-02 – 2015-08-03 (×2): 12.5 mg
  Filled 2015-08-02 (×2): qty 10

## 2015-08-02 MED ORDER — POTASSIUM CHLORIDE CRYS ER 20 MEQ PO TBCR
60.0000 meq | EXTENDED_RELEASE_TABLET | Freq: Four times a day (QID) | ORAL | Status: AC
  Administered 2015-08-03: 60 meq via ORAL
  Filled 2015-08-02: qty 3

## 2015-08-02 MED ORDER — POTASSIUM CHLORIDE 20 MEQ/15ML (10%) PO SOLN
60.0000 meq | Freq: Four times a day (QID) | ORAL | Status: DC
  Administered 2015-08-02: 60 meq
  Filled 2015-08-02: qty 45

## 2015-08-02 MED ORDER — MAGNESIUM SULFATE 2 GM/50ML IV SOLN
2.0000 g | Freq: Once | INTRAVENOUS | Status: AC
  Administered 2015-08-02: 2 g via INTRAVENOUS
  Filled 2015-08-02: qty 50

## 2015-08-02 NOTE — Progress Notes (Signed)
ANTICOAGULATION CONSULT NOTE  Pharmacy Consult:  Heparin Indication: atrial fibrillation  Allergies  Allergen Reactions  . Ace Inhibitors Other (See Comments)    Causes high K  . Angiotensin Receptor Blockers Other (See Comments)    Causes high K  . Metoprolol Other (See Comments)    May cause elevated K level  . Penicillins Swelling and Rash  . Tetracycline Swelling and Rash    Patient Measurements: Height: 5\' 8"  (172.7 cm) Weight: 186 lb 4.6 oz (84.5 kg) IBW/kg (Calculated) : 68.4 Heparin Dosing Weight: 89 kg  Vital Signs: Temp: 98.9 F (37.2 C) (02/20 0747) Temp Source: Oral (02/20 0400) BP: 122/89 mmHg (02/20 0747) Pulse Rate: 116 (02/20 0747)  Labs:  Recent Labs  07/31/15 0603 08/01/15 0021 08/01/15 0524 08/02/15 0220  HGB 9.8* 10.1* 10.0* 10.0*  HCT 32.5* 33.1* 33.7* 33.4*  PLT 325 307 282 295  HEPARINUNFRC 0.67  --  0.63 0.50  CREATININE 1.30* 1.27* 1.43* 1.49*  TROPONINI  --  0.51*  --   --     Estimated Creatinine Clearance: 44.6 mL/min (by C-G formula based on Cr of 1.49).    Assessment: 52 YOM continues on IV heparin for new onset afib, CHADSVASc 5, HL was therapeutic x 5 on 1750 units/hr, then INR/aPTT jumped and hep stopped 2/13; restarted hep 2/16.  HL now therapeutic at 0.5. Hgb stable at 10, plts wnl. RN reports no s/s of bleeding   Goal of Therapy:  Heparin level 0.3-0.7 units/ml Monitor platelets by anticoagulation protocol: Yes   Plan:  Continue heparin gtt at 1550 units/hr Monitor daily HL, CBC, s/s of bleed F/U long-term [facility] plan   Albertina Parr, PharmD., BCPS Clinical Pharmacist Pager [phone]

## 2015-08-02 NOTE — Progress Notes (Signed)
[facility] PROGRESS NOTE   Curtis Frazier [id number] DOB: [birth year] DOA: 07/17/2015 PCP: Loura Pardon, MD  Subjective: Has more stronger voice today of low he will do okay with the MBSS later. Discontinued NG tube as it might interfere with swallowing, if doesn't pass swallow eval will need a feeding tube to be placed again. no fever chills after reinitiation of antibiotics. Continues to have A. fib with RVR. Still on Ventimask 50% of oxygen.  HPI: 75 yo who is s/p recent lumbar surgery on 2/2. Following d/c on 2/4 developed acute confusion and worsening weakness w/ fever > 102. He was brought to the ER 07/17/15 and admitted w/ working dx of sepsis felt due to PNA. Course complicated by shock, respiratory failure requiring intubation, and atrial fibrillation with RVR. Extubated on 07/28/2015.  Assessment/Plan: Principal Problem:   Sepsis ([place]) Active Problems:   Essential hypertension, benign   Coronary atherosclerosis   Rheumatoid arthritis (HCC)   Acute encephalopathy   LLL pneumonia   Type II or unspecified type diabetes mellitus without mention of complication, not stated as uncontrolled   S/P lumbar laminectomy   Elevated troponin   Acute respiratory failure with hypoxia (HCC)   Acute pulmonary edema (HCC)   HCAP (healthcare-associated pneumonia)   Palliative care encounter   DNR (do not resuscitate) discussion   Acute respiratory failure ([place])   Endotracheally intubated   CHF (congestive heart failure) ([place])   Change in mental state   Advanced care planning/counseling discussion   Pneumonia   PAF (paroxysmal atrial fibrillation) ([place])   Counseling regarding advanced care planning and goals of care   Disorientation   Slow transit constipation   Back pain without radiation   Dyspnea    Acute hypoxic respiratory failure Acute respiratory failure with hypoxia, ventilator dependent. Patient extubated on 07/28/2015. Patient was under PCCM until 2/16  transferred to [facility] for further evaluation. Currently on Ventimask had 50%, keep output greater than intake.  Sepsis Evident on admission, likely secondary to left lower lobe pneumonia. Patient has a recent lumbar laminectomy, MR of the brain showed no evidence of infection. He has some fluid collection likely postoperative seroma, radiology finding consistent with arachnoiditis.   Acute on chronic CHF Patient has acute CHF with preserved LVEF of 65%. Still has some fluid overload, continue Lasix. Chest x-ray still showing edema,has a lot of fluids intake secondary to multiple drips and tube feeding. continue Lasix, check chest x-ray in the morning.  Atrial fibrillation with rapid ventricular response Appreciate cardiology consultation, patient is on amiodarone, diltiazem, digoxin and metoprolol as needed. Rate is not controlled. I will add low-dose of metoprolol.  On heparin drip.  LLL pneumonia  Currently on meropenem and vancomycin. Continue deep suctioned to remove secretions.  Hypernatremia On free water flushes, improving,sodium improved to 145 today.  Hypokalemia Secondary to diuresis, replete with enteral supplements. magnesium is okay at 2.0.  CK stage III Creatinine baseline is 1.5, around baseline for now.  Status post lumbar laminectomy Has had recent back surgery, according to notes from neurosurgery no evidence of infection. Patient anyway is on broad-spectrum antibiotics.  Rheumatoid arthritis and PMR Patient is on methotrexate and 5 mg of prednisone at home. Continue Solu-Cortef for now, will switched to prednisone in 1-2 days.   Code Status: Full Code Family Communication: Plan discussed with the patient. Disposition Plan: Remains inpatient Diet:    Consultants:  Patient transferred to [facility] service on 07/30/2015  Procedures:  None  Antibiotics:  Meropenem and vancomycin  Objective: Filed Vitals:   08/02/15 0400 08/02/15 0747  BP: 127/65  122/89  Pulse: 135 116  Temp: 97.9 F (36.6 C) 98.9 F (37.2 C)  Resp: 22 23    Intake/Output Summary (Last 24 hours) at 08/02/15 1235 Last data filed at 08/02/15 0900  Gross per 24 hour  Intake 2764.1 ml  Output   5850 ml  Net -3085.9 ml   Filed Weights   07/31/15 0452 08/01/15 0500 08/02/15 0500  Weight: 89.7 kg (197 lb 12 oz) 90.1 kg (198 lb 10.2 oz) 84.5 kg (186 lb 4.6 oz)    Exam: General: Alert and awake, oriented x3, not in any acute distress. HEENT: anicteric sclera, pupils reactive to light and accommodation, EOMI CVS: S1-S2 clear, no murmur rubs or gallops Chest: clear to auscultation bilaterally, no wheezing, rales or rhonchi Abdomen: soft nontender, nondistended, normal bowel sounds, no organomegaly Extremities: no cyanosis, clubbing or edema noted bilaterally Neuro: Cranial nerves II-XII intact, no focal neurological deficits  Data Reviewed: Basic Metabolic Panel:  Recent Labs Lab 07/27/15 0625  07/30/15 1045 07/31/15 0603 08/01/15 0021 08/01/15 0524 08/02/15 0220  NA 143  < > 153* 149* 151* 146* 145  K 4.6  < > 3.1* 3.2* 4.4 4.5 3.3*  CL 108  < > 116* 106 109 106 100*  CO2 24  < > 25 31 32 32 33*  GLUCOSE 242*  < > 183* 90 104* 260* 244*  BUN 79*  < > 38* 31* 28* 30* 30*  CREATININE 1.49*  < > 1.20 1.30* 1.27* 1.43* 1.49*  CALCIUM 7.3*  < > 7.7* 7.9* 8.2* 7.9* 8.5*  MG 2.1  --  2.0  --   --   --   --   PHOS 3.9  --   --   --   --   --   --   < > = values in this interval not displayed. Liver Function Tests:  Recent Labs Lab 07/27/15 0625 07/28/15 0832 08/01/15 0021  AST 96* 47* 38  ALT 77* 69* 46  ALKPHOS 156* 135* 97  BILITOT 0.5 0.6 0.7  PROT 5.7* 6.6 6.1*  ALBUMIN 1.8* 2.3* 2.3*   No results for input(s): LIPASE, AMYLASE in the last 168 hours.  Recent Labs Lab 08/01/15 0022  AMMONIA 28   CBC:  Recent Labs Lab 07/30/15 0636 07/31/15 0603 08/01/15 0021 08/01/15 0524 08/02/15 0220  WBC 10.3 12.3* 10.9* 9.9 10.4  HGB 9.5*  9.8* 10.1* 10.0* 10.0*  HCT 30.4* 32.5* 33.1* 33.7* 33.4*  MCV 100.0 99.4 102.5* 99.7 98.8  PLT 323 325 307 282 295   Cardiac Enzymes:  Recent Labs Lab 08/01/15 0021  TROPONINI 0.51*   BNP (last 3 results)  Recent Labs  07/18/15 1334 07/19/15 1622 08/01/15 0022  BNP 618.1* 1734.1* 881.4*    ProBNP (last 3 results) No results for input(s): PROBNP in the last 8760 hours.  CBG:  Recent Labs Lab 08/01/15 1603 08/01/15 2038 08/02/15 0009 08/02/15 0447 08/02/15 0742  GLUCAP 209* 199* 249* 288* 172*    Micro Recent Results (from the past 240 hour(s))  Culture, respiratory (NON-Expectorated)     Status: None   Collection Time: 07/25/15  3:20 PM  Result Value Ref Range Status   Specimen Description BRONCHIAL ALVEOLAR LAVAGE  Final   Special Requests NONE  Final   Gram Stain   Final    NO WBC SEEN NO SQUAMOUS EPITHELIAL CELLS SEEN NO ORGANISMS SEEN Performed at [facility]  Culture   Final    FEW YEAST CONSISTENT WITH CANDIDA SPECIES Performed at [facility]    Report Status 07/27/2015 FINAL  Final     Studies: Dg Chest Port 1 View  07/31/2015  CLINICAL DATA:  Dyspnea EXAM: PORTABLE CHEST 1 VIEW COMPARISON:  07/29/2015 FINDINGS: Shallow inspiration. Cardiac enlargement with mild pulmonary vascular congestion. Diffuse peripheral interstitial pattern to the lungs with hazy infiltration in the lung bases likely due to edema. Probable small left pleural effusion. Mild progression since previous study. An enteric tube is been placed. The tip is off the field of view but below the left hemidiaphragm. Right PICC line is unchanged in position with tip over the low SVC. No pneumothorax. Calcified and tortuous aorta. Postoperative changes in the thoracic and upper lumbar spine. Probable thoracolumbar scoliosis with convexity towards the right. IMPRESSION: Cardiac enlargement with pulmonary vascular congestion and pulmonary edema, progressing since previous  study. Electronically Signed   By: Lucienne Capers M.D.   On: 07/31/2015 22:30    Scheduled Meds: . acetylcysteine  4 mL Nebulization TID  . antiseptic oral rinse  7 mL Mouth Rinse QID  . chlorhexidine gluconate  15 mL Mouth Rinse BID  . digoxin  0.125 mg Intravenous Daily  . diltiazem  90 mg Oral 4 times per day  . feeding supplement (PRO-STAT SUGAR FREE 64)  30 mL Per Tube Daily  . folic acid  1 mg Per Tube Daily  . free water  300 mL Per Tube Q6H  . furosemide  60 mg Intravenous Q12H  . guaiFENesin  10 mL Oral Q4H  . hydrocortisone sod succinate (SOLU-CORTEF) inj  50 mg Intravenous Q12H  . insulin aspart  0-20 Units Subcutaneous 6 times per day  . insulin aspart  3 Units Subcutaneous 6 times per day  . levalbuterol  0.63 mg Nebulization Q6H  . meropenem (MERREM) IV  1 g Intravenous Q12H  . pantoprazole sodium  40 mg Per Tube Daily  . polyethylene glycol  17 g Per Tube Daily  . sodium chloride flush  10-40 mL Intracatheter Q12H  . vancomycin  750 mg Intravenous Q24H   Continuous Infusions: . amiodarone 30 mg/hr (08/02/15 1011)  . feeding supplement (JEVITY 1.2 CAL) 1,000 mL (08/02/15 1008)  . heparin 1,550 Units/hr (08/02/15 0447)       Time spent: 35 minutes    [facility]  [facility] Pager [phone] If 7PM-7AM, please contact night-coverage at [email or web address], password [facility] 08/02/2015, 12:35 PM  LOS: 16 days

## 2015-08-02 NOTE — Progress Notes (Signed)
Subjective:  The patient is alert and pleasant. He denies pain.  Objective: Vital signs in last 24 hours: Temp:  [97.9 F (36.6 C)-99.7 F (37.6 C)] 98.4 F (36.9 C) (02/20 1645) Pulse Rate:  [72-135] 72 (02/20 1600) Resp:  [14-30] 25 (02/20 1600) BP: (88-127)/(38-89) 100/64 mmHg (02/20 1600) SpO2:  [89 %-97 %] 97 % (02/20 1600) FiO2 (%):  [50 %] 50 % (02/20 1424) Weight:  [84.5 kg (186 lb 4.6 oz)] 84.5 kg (186 lb 4.6 oz) (02/20 0500)  Intake/Output from previous day: 02/19 0701 - 02/20 0700 In: 3349.1 [I.V.:604.1; NG/GT:2395; IV Piggyback:350] Out: 6400 [Urine:6400] Intake/Output this shift: Total I/O In: 1033 [I.V.:93; NG/GT:690; IV Piggyback:250] Out: 2400 [Urine:2400]  Physical exam the patient is alert and pleasant. His lumbar dressing is clean and dry. He is moving his lower extremities well. He is oriented.  Lab Results:  Recent Labs  08/01/15 0524 08/02/15 0220  WBC 9.9 10.4  HGB 10.0* 10.0*  HCT 33.7* 33.4*  PLT 282 295   BMET  Recent Labs  08/01/15 0524 08/02/15 0220  NA 146* 145  K 4.5 3.3*  CL 106 100*  CO2 32 33*  GLUCOSE 260* 244*  BUN 30* 30*  CREATININE 1.43* 1.49*  CALCIUM 7.9* 8.5*    Studies/Results: Dg Chest Port 1 View  07/31/2015  CLINICAL DATA:  Dyspnea EXAM: PORTABLE CHEST 1 VIEW COMPARISON:  07/29/2015 FINDINGS: Shallow inspiration. Cardiac enlargement with mild pulmonary vascular congestion. Diffuse peripheral interstitial pattern to the lungs with hazy infiltration in the lung bases likely due to edema. Probable small left pleural effusion. Mild progression since previous study. An enteric tube is been placed. The tip is off the field of view but below the left hemidiaphragm. Right PICC line is unchanged in position with tip over the low SVC. No pneumothorax. Calcified and tortuous aorta. Postoperative changes in the thoracic and upper lumbar spine. Probable thoracolumbar scoliosis with convexity towards the right. IMPRESSION: Cardiac  enlargement with pulmonary vascular congestion and pulmonary edema, progressing since previous study. Electronically Signed   By: Lucienne Capers M.D.   On: 07/31/2015 22:30   Dg Swallowing Func-speech Pathology  08/02/2015  Objective Swallowing Evaluation: Type of Study: MBS-Modified Barium Swallow Study Patient Details Name: Curtis Frazier MRN: [id number] Date of Birth: 01-27-1941 Today's Date: 08/02/2015 Time: SLP Start Time (ACUTE ONLY): 1316-SLP Stop Time (ACUTE ONLY): 1332 SLP Time Calculation (min) (ACUTE ONLY): 16 min Past Medical History: Past Medical History Diagnosis Date . Other and unspecified hyperlipidemia  . Coronary atherosclerosis of unspecified type of vessel, native or graft  . Personal history of colonic polyps  . Actinic keratosis  . Psoriatic arthropathy ([place])  . Other psoriasis  . Scoliosis (and kyphoscoliosis), idiopathic  . GI bleed 12/10   cecam AVM . Gastritis 12/10 . Hyperlipidemia    takes Pravastatin daily . Hypertension    takes Metoprolol daily . Myocardial infarction ([place]) 2010 . Shortness of breath    with exertion . Pneumonia    hx of-2008 . Headache(784.0)    occasionally . Joint pain  . Joint swelling  . Rheumatoid arthritis(714.0)    takes Methotrexate 7pills weekly . Degeneration of intervertebral disc, site unspecified  . Chronic back pain    scoliosis/stenosis/radiculopathy,degenerative disc disease . Psoriasis  . Bruises easily  . Esophageal reflux    takes Omeprazole daily . History of colonic polyps  . Hemorrhoids  . Kidney stone    hx of . Blood transfusion 2010 . Type II or unspecified  type diabetes mellitus without mention of complication, not stated as uncontrolled    type II;controlled by diet and exercise . PMR (polymyalgia rheumatica) ([facility]) 01/20/2012   tx by specialist on a long prednisone taper . S/P PTCA (percutaneous transluminal coronary angioplasty)  Past Surgical History: Past Surgical History Procedure Laterality Date . Rotator cuff repair Left 2000 .  Colonoscopy   . Vasectomy  1979 . Coronary angioplasty with stent placement  02/2009   has one stent . Carotid doppler  10/12   0-39% R and 60-79% left  . Cataract extraction  2012 . Cardiac catheterization  2010 . Lithotripsy  2009 HPI: 75 y.o. male with h/o HLD, CAD, scoliosis, GI bleed, HTN, myocardial infarction (2010< SOB, PNA, esophageal reflux and type 2 DM with recent laminectomy who presented to ED with c/o confusion and fever. CXR 2/4 revealed LLL atelectasis vs. early infiltrate. Pt intubated 2/8-2/15 (8 days). CXR 2/17 LLL atelectasis or pneumonia. Pt has been seen by ST since extubation and is appropriate for objective assessment. Subjective: The patient was seen with his breakfast tray sitting in his bedside chair.  Assessment / Plan / Recommendation CHL IP CLINICAL IMPRESSIONS 08/02/2015 Therapy Diagnosis Mild pharyngeal phase dysphagia;Moderate pharyngeal phase dysphagia;Mild oral phase dysphagia Clinical Impression Pt exhibited mild oral phase dysphagia and mild-mod pharyngeal phase dysphagia. Delayed oral transit with thicker consistencies and delayed swallow initiation to the vallecula observed. Reduced laryngeal elevation and closure resulted in penetration during the swallow with honey thick liquid, however administration of liquid via teaspoon appeared to prevent further penetration events. Mod-max vallecular residue occured due to decreased base of tongue retraction. SLP provided mod verbal cues to swallow x2 which was effective to decrease amount. Decreased apneic period and increased effort required during mastication results in dyspnea. Pt and daughter educated re: results of MBSS and diet recommendation and decreased respiratory status leading to higher aspiration risk. Recommend Dysphagia 1 (puree) diet, honey thick liquids, meds crushed in puree, swallow x2 after each bite and full supervision. SLP will f/u to determine safety with recommended diet/liquids. Impact on safety and function  Severe aspiration risk   CHL IP TREATMENT RECOMMENDATION 08/02/2015 Treatment Recommendations Therapy as outlined in treatment plan below   Prognosis 08/02/2015 Prognosis for Safe Diet Advancement Good Barriers to Reach Goals Severity of deficits Barriers/Prognosis Comment -- CHL IP DIET RECOMMENDATION 08/02/2015 SLP Diet Recommendations Dysphagia 1 (Puree) solids;Honey thick liquids Liquid Administration via Spoon Medication Administration Crushed with puree Compensations Minimize environmental distractions;Slow rate;Small sips/bites;Multiple dry swallows after each bite/sip Postural Changes Seated upright at 90 degrees   CHL IP OTHER RECOMMENDATIONS 08/02/2015 Recommended Consults -- Oral Care Recommendations Oral care BID Other Recommendations Have oral suction available;Order thickener from pharmacy;Prohibited food (jello, ice cream, thin soups)   CHL IP FOLLOW UP RECOMMENDATIONS 08/02/2015 Follow up Recommendations Inpatient Rehab   CHL IP FREQUENCY AND DURATION 08/02/2015 Speech Therapy Frequency (ACUTE ONLY) min 2x/week Treatment Duration 2 weeks      CHL IP ORAL PHASE 08/02/2015 Oral Phase Impaired Oral - Pudding Teaspoon -- Oral - Pudding Cup -- Oral - Honey Teaspoon Delayed oral transit Oral - Honey Cup Delayed oral transit Oral - Nectar Teaspoon WFL Oral - Nectar Cup -- Oral - Nectar Straw -- Oral - Thin Teaspoon -- Oral - Thin Cup -- Oral - Thin Straw -- Oral - Puree Delayed oral transit Oral - Mech Soft Delayed oral transit Oral - Regular -- Oral - Multi-Consistency -- Oral - Pill -- Oral Phase - Comment --  CHL IP PHARYNGEAL PHASE 08/02/2015 Pharyngeal Phase Impaired Pharyngeal- Pudding Teaspoon -- Pharyngeal -- Pharyngeal- Pudding Cup -- Pharyngeal -- Pharyngeal- Honey Teaspoon Penetration/Aspiration during swallow;Delayed swallow initiation-vallecula;Pharyngeal residue - valleculae;Reduced airway/laryngeal closure;Reduced tongue base retraction Pharyngeal Material enters airway, remains ABOVE vocal cords  then ejected out Pharyngeal- Honey Cup Delayed swallow initiation-vallecula;Pharyngeal residue - valleculae;Penetration/Aspiration during swallow;Reduced laryngeal elevation;Reduced airway/laryngeal closure;Reduced epiglottic inversion;Reduced tongue base retraction Pharyngeal Material enters airway, CONTACTS cords and not ejected out Pharyngeal- Nectar Teaspoon Delayed swallow initiation-pyriform sinuses;Pharyngeal residue - valleculae Pharyngeal -- Pharyngeal- Nectar Cup -- Pharyngeal -- Pharyngeal- Nectar Straw -- Pharyngeal -- Pharyngeal- Thin Teaspoon -- Pharyngeal -- Pharyngeal- Thin Cup -- Pharyngeal -- Pharyngeal- Thin Straw -- Pharyngeal -- Pharyngeal- Puree Delayed swallow initiation-vallecula;Pharyngeal residue - valleculae Pharyngeal -- Pharyngeal- Mechanical Soft Pharyngeal residue - valleculae;Delayed swallow initiation-vallecula Pharyngeal -- Pharyngeal- Regular -- Pharyngeal -- Pharyngeal- Multi-consistency -- Pharyngeal -- Pharyngeal- Pill -- Pharyngeal -- Pharyngeal Comment --  CHL IP CERVICAL ESOPHAGEAL PHASE 08/02/2015 Cervical Esophageal Phase WFL Pudding Teaspoon -- Pudding Cup -- Honey Teaspoon -- Honey Cup -- Nectar Teaspoon -- Nectar Cup -- Nectar Straw -- Thin Teaspoon -- Thin Cup -- Thin Straw -- Puree -- Mechanical Soft -- Regular -- Multi-consistency -- Pill -- Cervical Esophageal Comment -- CHL IP GO 07/18/2015 Functional Assessment Tool Used ASHA NOMS and clinical judgment.   Functional Limitations Swallowing Swallow Current Status [phone]) CI Swallow Goal Status [id number]) CI Swallow Discharge Status [phone]) (None) Motor Speech Current Status [phone]) (None) Motor Speech Goal Status [phone]) (None) Motor Speech Goal Status [phone]) (None) Spoken Language Comprehension Current Status [phone]) (None) Spoken Language Comprehension Goal Status [id number]) (None) Spoken Language Comprehension Discharge Status [phone]) (None) Spoken Language Expression Current Status [phone]) (None) Spoken Language  Expression Goal Status [phone]) (None) Spoken Language Expression Discharge Status [phone]) (None) Attention Current Status [id number]) (None) Attention Goal Status [id number]) (None) Attention Discharge Status [phone]) (None) Memory Current Status [id number]) (None) Memory Goal Status [id number]) (None) Memory Discharge Status [id number]) (None) Voice Current Status [id number]) (None) Voice Goal Status [id number]) (None) Voice Discharge Status [id number]) (None) Other Speech-Language Pathology Functional Limitation [phone]) (None) Other Speech-Language Pathology Functional Limitation Goal Status [id number]) (None) Other Speech-Language Pathology Functional Limitation Discharge Status [phone]) (None) [place] Siren 08/02/2015, 2:36 PM Curtis Frazier Caroli.Ed CCC-SLP Pager [phone]               Assessment/Plan: The patient is doing much better. I spoke with his sons.  LOS: 16 days     [name] Lehner D 08/02/2015, 6:50 PM

## 2015-08-02 NOTE — Progress Notes (Signed)
MBSS complete. Full report located under chart review in imaging section. Recommend: Dys 1, honey liquids vis TEASPOON only   Orbie Pyo Romney.Ed [employer] [phone]

## 2015-08-02 NOTE — Progress Notes (Signed)
Physical Therapy Treatment Patient Details Name: Curtis Frazier MRN: [id number] DOB: [birth year] Today's Date: 08/02/2015    History of Present Illness 23 yom who is s/p recent lumbar surg on 2/2. Following d/c on 2/4 developed acute confusion and worsening weakness w/ fever > 102. He was brought to the ER. Admitted w/ working dx of sepsis felt d/t PNA +/- UTI. His hospital course was has consisted of  intermittent fever, AF w/ RVR, hypoxia and confusion. On 2/7 doing better. On 2/8 developed increased SOB, and was placed on NIPPV, this was followed again by fever and then worsening agitation. on 2/8 he was intubated after his agitation escalated to point he would no longer keep his BIPAP on, worsening CHF. Extubated 2/15. Now on Venturi mask    PT Comments    Pt admitted with above diagnosis. Pt currently with functional limitations due to balance and endurance deficits. Pt was able to stand and pivot to the chair with 2 person assist.  Pt HR was a little better today although still at 121-138bpm.  Sats stable >90% on 50% venturi mask.  Will continue acute PT.   Pt will benefit from skilled PT to increase their independence and safety with mobility to allow discharge to the venue listed below.    Follow Up Recommendations  LTACH;Supervision/Assistance - 24 hour     Equipment Recommendations  Other (comment) (TBA)    Recommendations for Other Services       Precautions / Restrictions Precautions Precautions: Fall;Back Precaution Comments: Monitor HR (Afib and tachy);BP high Restrictions Weight Bearing Restrictions: No    Mobility  Bed Mobility Overal bed mobility: Needs Assistance;+2 for physical assistance Bed Mobility: Rolling;Sidelying to Sit;Sit to Supine Rolling: Mod assist;+2 for physical assistance Sidelying to sit: Max assist;+2 for physical assistance       General bed mobility comments: cues and A for seqencing to log roll due to back precautions.  Asssist for LEs and for  elevation of trunk.    Transfers Overall transfer level: Needs assistance Equipment used: 2 person hand held assist Transfers: Sit to/from [facility] Sit to Stand: Max assist;+2 physical assistance;From elevated surface Stand pivot transfers: Mod assist;+2 physical assistance;From elevated surface       General transfer comment: Pt able to stand more upright but still with flexed posture flexed at hips and knees.  Pt buckled needing assist for upright stance and blocked right knee as it was buckling.  Needed assist to move bil LEs throughout pivot.    Ambulation/Gait                 Stairs            Wheelchair Mobility    Modified Rankin (Stroke Patients Only)       Balance Overall balance assessment: Needs assistance;History of Falls Sitting-balance support: Bilateral upper extremity supported;Feet supported Sitting balance-Leahy Scale: Zero Sitting balance - Comments: Pt needed mod to max assist to sit EOB with pt leaning posteriorly.  Pt needed cues and assist to sit for 15 minutes at EOb.  Pt not using his Ue support effectively.   Worked on sitting balance with pt reaching for PT's knees and for chair to lean forward.   Postural control: Posterior lean Standing balance support: Bilateral upper extremity supported;During functional activity Standing balance-Leahy Scale: Zero Standing balance comment: Needed mod to max asssit to stand with bil UE support and could not stand fully upright.  Cognition Arousal/Alertness: Awake/alert Behavior During Therapy: Flat affect Overall Cognitive Status: Impaired/Different from baseline Area of Impairment: Orientation;Following commands;Problem solving Orientation Level: Disoriented to;Place;Time;Situation     Following Commands: Follows one step commands with increased time     Problem Solving: Slow processing;Decreased initiation;Difficulty sequencing;Requires verbal  cues;Requires tactile cues      Exercises General Exercises - Lower Extremity Ankle Circles/Pumps: AROM;Both;5 reps;Seated Long Arc Quad: AAROM;Both;5 reps;Seated    General Comments General comments (skin integrity, edema, etc.): Nursing placed sacral dressing on pt while standing as his bottom was red.        Pertinent Vitals/Pain Pain Assessment: Faces Faces Pain Scale: Hurts even more Pain Location: back Pain Descriptors / Indicators: Aching;Grimacing;Guarding Pain Intervention(s): Limited activity within patient's tolerance;Monitored during session;Repositioned  Sats >90% on 50% venturi mask.  HR 121-138 bpm.     Home Living                      Prior Function            PT Goals (current goals can now be found in the care plan section) Progress towards PT goals: Progressing toward goals    Frequency  Min 3X/week    PT Plan Current plan remains appropriate    Co-evaluation             End of Session Equipment Utilized During Treatment: Gait belt;Oxygen Activity Tolerance: Patient limited by fatigue;Patient limited by pain Patient left: in chair;with call bell/phone within reach;with chair alarm set;with family/visitor present     Time: [id number] PT Time Calculation (min) (ACUTE ONLY): 28 min  Charges:  $Therapeutic Activity: 23-37 mins                    G CodesDenice Frazier August 18, 2015, 4:26 PM Curtis Frazier,PT Acute Rehabilitation [phone] (pager)

## 2015-08-02 NOTE — Progress Notes (Signed)
Inpatient Diabetes Program Recommendations  AACE/ADA: New Consensus Statement on Inpatient Glycemic Control (2015)  Target Ranges:  Prepandial:   less than 140 mg/dL      Peak postprandial:   less than 180 mg/dL (1-2 hours)      Critically ill patients:  140 - 180 mg/dL   Review of Glycemic Control:  Results for Curtis Frazier, CHIARI (MRN [id number]) as of 08/02/2015 11:45  Ref. Range 08/01/2015 04:48 08/01/2015 07:47 08/01/2015 13:31 08/01/2015 16:03 08/01/2015 20:38 08/02/2015 00:09  Glucose-Capillary Latest Ref Range: 65-99 mg/dL 222 (H) 223 (H) 233 (H) 209 (H) 199 (H) 249 (H)    Inpatient Diabetes Program Recommendations: While on solucortef, please consider Adding lantus starting with 10 units daily.Thank you Rosita Kea, RN, MSN, CDE   Diabetes Inpatient Program Office: [phone] Pager: [phone] 8:00 am to 5:00 pm

## 2015-08-02 NOTE — Progress Notes (Signed)
DAILY PROGRESS NOTE  Subjective:  No events overnight. Remains in a-fib with RVR on ventimask. BP improved. Still not able to take po. Getting tube feeds. Plan for swallow evaluation today.  Objective:  Temp:  [97.9 F (36.6 C)-99.7 F (37.6 C)] 98.9 F (37.2 C) (02/20 0747) Pulse Rate:  [111-135] 116 (02/20 0747) Resp:  [14-30] 23 (02/20 0747) BP: (88-142)/(38-89) 122/89 mmHg (02/20 0747) SpO2:  [89 %-96 %] 94 % (02/20 0747) FiO2 (%):  [50 %] 50 % (02/20 0747) Weight:  [186 lb 4.6 oz (84.5 kg)] 186 lb 4.6 oz (84.5 kg) (02/20 0500) Weight change: -12 lb 5.5 oz (-5.6 kg)  Intake/Output from previous day: 02/19 0701 - 02/20 0700 In: 3349.1 [I.V.:604.1; NG/GT:2395; IV Piggyback:350] Out: 6400 [Urine:6400]  Intake/Output from this shift: Total I/O In: -  Out: 450 [Urine:450]  Medications: Current Facility-Administered Medications  Medication Dose Route Frequency Provider Last Rate Last Dose  . acetaminophen (TYLENOL) tablet 650 mg  650 mg Oral Q4H PRN Willia Craze, NP   650 mg at 08/01/15 0145  . acetylcysteine (MUCOMYST) 20 % nebulizer / oral solution 4 mL  4 mL Nebulization TID Verlee Monte, MD   4 mL at 08/02/15 0741  . amiodarone (NEXTERONE PREMIX) 360 MG/200ML (1.8 mg/mL) IV infusion  30 mg/hr Intravenous Continuous Rogelia Mire, NP 16.7 mL/hr at 08/01/15 1848 30 mg/hr at 08/01/15 1848  . antiseptic oral rinse solution (CORINZ)  7 mL Mouth Rinse QID Erick Colace, NP   7 mL at 08/02/15 0400  . bisacodyl (DULCOLAX) suppository 10 mg  10 mg Rectal Daily PRN Katheren Shams, DO      . chlorhexidine gluconate (PERIDEX) 0.12 % solution 15 mL  15 mL Mouth Rinse BID Erick Colace, NP   15 mL at 08/02/15 0900  . digoxin (LANOXIN) 0.25 MG/ML injection 0.125 mg  0.125 mg Intravenous Daily [name], MD   0.125 mg at 08/02/15 0929  . diltiazem (CARDIZEM) tablet 90 mg  90 mg Oral 4 times per day Verlee Monte, MD   90 mg at 08/02/15 0501  . docusate (COLACE)  50 MG/5ML liquid 100 mg  100 mg Per Tube BID PRN Erick Colace, NP      . feeding supplement (JEVITY 1.2 CAL) liquid 1,000 mL  1,000 mL Per Tube Continuous [name], MD 65 mL/hr at 08/02/15 1008 1,000 mL at 08/02/15 1008  . feeding supplement (PRO-STAT SUGAR FREE 64) liquid 30 mL  30 mL Per Tube Daily [name], MD   30 mL at 08/02/15 0957  . folic acid (FOLVITE) tablet 1 mg  1 mg Per Tube Daily Erick Colace, NP   1 mg at 08/02/15 0939  . free water 300 mL  300 mL Per Tube Q6H Verlee Monte, MD   300 mL at 08/02/15 0600  . furosemide (LASIX) injection 60 mg  60 mg Intravenous Q12H Verlee Monte, MD   60 mg at 08/02/15 0957  . guaiFENesin (ROBITUSSIN) 100 MG/5ML solution 200 mg  10 mL Oral Q4H Mutaz Elmahi, MD   200 mg at 08/02/15 0622  . heparin ADULT infusion 100 units/mL (25000 units/250 mL)  1,550 Units/hr Intravenous Continuous Cecilio Asper Batchelder, RPH 15.5 mL/hr at 08/02/15 0447 1,550 Units/hr at 08/02/15 0447  . hydrocortisone sodium succinate (SOLU-CORTEF) 100 MG injection 50 mg  50 mg Intravenous Q12H Verlee Monte, MD   50 mg at 08/02/15 0929  . insulin aspart (novoLOG) injection  0-20 Units  0-20 Units Subcutaneous 6 times per day Katheren Shams, DO   4 Units at 08/02/15 0900  . insulin aspart (novoLOG) injection 3 Units  3 Units Subcutaneous 6 times per day [name], MD   3 Units at 08/02/15 0900  . levalbuterol (XOPENEX) nebulizer solution 0.63 mg  0.63 mg Nebulization Q6H Modena Jansky, MD   0.63 mg at 08/02/15 0741  . levalbuterol (XOPENEX) nebulizer solution 0.63 mg  0.63 mg Nebulization Q6H PRN Katheren Shams, DO   0.63 mg at 07/27/15 1153  . meropenem (MERREM) 1 g in sodium chloride 0.9 % 100 mL IVPB  1 g Intravenous Q12H Cecilio Asper Batchelder, RPH   1 g at 08/02/15 0930  . ondansetron (ZOFRAN) injection 4 mg  4 mg Intravenous Q6H PRN Ritta Slot, NP      . pantoprazole sodium (PROTONIX) 40 mg/20 mL oral suspension 40 mg  40 mg Per Tube Daily Cassie Jodean Lima, RPH   40 mg at 08/02/15 0947  . polyethylene glycol (MIRALAX / GLYCOLAX) packet 17 g  17 g Per Tube Daily [name], MD   17 g at 08/02/15 [phone]  . sodium chloride flush (NS) 0.9 % injection 10-40 mL  10-40 mL Intracatheter Q12H Orson Eva, MD   10 mL at 08/02/15 0948  . sodium chloride flush (NS) 0.9 % injection 10-40 mL  10-40 mL Intracatheter PRN Orson Eva, MD   10 mL at 07/19/15 0853  . vancomycin (VANCOCIN) IVPB 750 mg/150 ml premix  750 mg Intravenous Q24H Cecilio Asper Batchelder, RPH   750 mg at 08/01/15 1032    Physical Exam: General appearance: alert, moderate distress and pale Neck: no carotid bruit and no JVD Lungs: diminished breath sounds bibasilar Heart: irregularly irregular rhythm Abdomen: soft, non-tender; bowel sounds normal; no masses,  no organomegaly Extremities: edema 1+ edema Pulses: 2+ and symmetric Skin: pale, cool, dry Neurologic: Mental status: Awake, but not talkative, struggles a little to breath Psych: Flat affect  Lab Results: Results for orders placed or performed during the hospital encounter of 07/17/15 (from the past 48 hour(s))  Glucose, capillary     Status: Abnormal   Collection Time: 07/31/15  1:16 PM  Result Value Ref Range   Glucose-Capillary 156 (H) 65 - 99 mg/dL  Glucose, capillary     Status: Abnormal   Collection Time: 07/31/15  4:02 PM  Result Value Ref Range   Glucose-Capillary 214 (H) 65 - 99 mg/dL  Glucose, capillary     Status: Abnormal   Collection Time: 07/31/15  8:59 PM  Result Value Ref Range   Glucose-Capillary 195 (H) 65 - 99 mg/dL  Blood gas, arterial     Status: Abnormal   Collection Time: 07/31/15 11:25 PM  Result Value Ref Range   FIO2 0.50    Delivery systems VENTURI MASK    pH, Arterial 7.519 (H) 7.350 - 7.450   pCO2 arterial 38.7 35.0 - 45.0 mmHg   pO2, Arterial 78.0 (L) 80.0 - 100.0 mmHg   Bicarbonate 31.3 (H) 20.0 - 24.0 mEq/L   TCO2 32.5 0 - 100 mmol/L   Acid-Base Excess 7.9 (H) 0.0 - 2.0 mmol/L     O2 Saturation 95.8 %   Patient temperature 98.6    Collection site LEFT RADIAL    Drawn by [id number]    Sample type ARTERIAL DRAW    Allens test (pass/fail) PASS PASS  CBC     Status: Abnormal  Collection Time: 08/01/15 12:21 AM  Result Value Ref Range   WBC 10.9 (H) 4.0 - 10.5 K/uL   RBC 3.23 (L) 4.22 - 5.81 MIL/uL   Hemoglobin 10.1 (L) 13.0 - 17.0 g/dL   HCT 33.1 (L) 39.0 - 52.0 %   MCV 102.5 (H) 78.0 - 100.0 fL   MCH 31.3 26.0 - 34.0 pg   MCHC 30.5 30.0 - 36.0 g/dL   RDW 17.6 (H) 11.5 - 15.5 %   Platelets 307 150 - 400 K/uL  Comprehensive metabolic panel     Status: Abnormal   Collection Time: 08/01/15 12:21 AM  Result Value Ref Range   Sodium 151 (H) 135 - 145 mmol/L   Potassium 4.4 3.5 - 5.1 mmol/L    Comment: DELTA CHECK NOTED   Chloride 109 101 - 111 mmol/L   CO2 32 22 - 32 mmol/L   Glucose, Bld 104 (H) 65 - 99 mg/dL   BUN 28 (H) 6 - 20 mg/dL   Creatinine, Ser 1.27 (H) 0.61 - 1.24 mg/dL   Calcium 8.2 (L) 8.9 - 10.3 mg/dL   Total Protein 6.1 (L) 6.5 - 8.1 g/dL   Albumin 2.3 (L) 3.5 - 5.0 g/dL   AST 38 15 - 41 U/L   ALT 46 17 - 63 U/L   Alkaline Phosphatase 97 38 - 126 U/L   Total Bilirubin 0.7 0.3 - 1.2 mg/dL   GFR calc non Af Amer 53 (L) >60 mL/min   GFR calc Af Amer >60 >60 mL/min    Comment: (NOTE) The eGFR has been calculated using the CKD EPI equation. This calculation has not been validated in all clinical situations. eGFR's persistently <60 mL/min signify possible Chronic Kidney Disease.    Anion gap 10 5 - 15  Troponin I     Status: Abnormal   Collection Time: 08/01/15 12:21 AM  Result Value Ref Range   Troponin I 0.51 (HH) <0.031 ng/mL    Comment:        POSSIBLE MYOCARDIAL ISCHEMIA. SERIAL TESTING RECOMMENDED. CRITICAL RESULT CALLED TO, READ BACK BY AND VERIFIED WITH: L.ADDAI,RN 08/01/15 M.CAMPBELL   Lactic acid, plasma     Status: None   Collection Time: 08/01/15 12:22 AM  Result Value Ref Range   Lactic Acid, Venous 1.5 0.5 - 2.0 mmol/L   Brain natriuretic peptide     Status: Abnormal   Collection Time: 08/01/15 12:22 AM  Result Value Ref Range   B Natriuretic Peptide 881.4 (H) 0.0 - 100.0 pg/mL  Ammonia     Status: None   Collection Time: 08/01/15 12:22 AM  Result Value Ref Range   Ammonia 28 9 - 35 umol/L  Glucose, capillary     Status: None   Collection Time: 08/01/15 12:42 AM  Result Value Ref Range   Glucose-Capillary 96 65 - 99 mg/dL  Glucose, capillary     Status: Abnormal   Collection Time: 08/01/15  4:48 AM  Result Value Ref Range   Glucose-Capillary 222 (H) 65 - 99 mg/dL  Heparin level (unfractionated)     Status: None   Collection Time: 08/01/15  5:24 AM  Result Value Ref Range   Heparin Unfractionated 0.63 0.30 - 0.70 IU/mL    Comment:        IF HEPARIN RESULTS ARE BELOW EXPECTED VALUES, AND PATIENT DOSAGE HAS BEEN CONFIRMED, SUGGEST FOLLOW UP TESTING OF ANTITHROMBIN III LEVELS.   CBC     Status: Abnormal   Collection Time: 08/01/15  5:24 AM  Result Value Ref Range   WBC 9.9 4.0 - 10.5 K/uL   RBC 3.38 (L) 4.22 - 5.81 MIL/uL   Hemoglobin 10.0 (L) 13.0 - 17.0 g/dL   HCT 33.7 (L) 39.0 - 52.0 %   MCV 99.7 78.0 - 100.0 fL   MCH 29.6 26.0 - 34.0 pg   MCHC 29.7 (L) 30.0 - 36.0 g/dL   RDW 17.3 (H) 11.5 - 15.5 %   Platelets 282 150 - 400 K/uL  Basic metabolic panel     Status: Abnormal   Collection Time: 08/01/15  5:24 AM  Result Value Ref Range   Sodium 146 (H) 135 - 145 mmol/L   Potassium 4.5 3.5 - 5.1 mmol/L   Chloride 106 101 - 111 mmol/L   CO2 32 22 - 32 mmol/L   Glucose, Bld 260 (H) 65 - 99 mg/dL   BUN 30 (H) 6 - 20 mg/dL   Creatinine, Ser 1.43 (H) 0.61 - 1.24 mg/dL   Calcium 7.9 (L) 8.9 - 10.3 mg/dL   GFR calc non Af Amer 46 (L) >60 mL/min   GFR calc Af Amer 53 (L) >60 mL/min    Comment: (NOTE) The eGFR has been calculated using the CKD EPI equation. This calculation has not been validated in all clinical situations. eGFR's persistently <60 mL/min signify possible Chronic  Kidney Disease.    Anion gap 8 5 - 15  Lactic acid, plasma     Status: None   Collection Time: 08/01/15  5:25 AM  Result Value Ref Range   Lactic Acid, Venous 1.0 0.5 - 2.0 mmol/L  Glucose, capillary     Status: Abnormal   Collection Time: 08/01/15  7:47 AM  Result Value Ref Range   Glucose-Capillary 223 (H) 65 - 99 mg/dL  Urinalysis, Routine w reflex microscopic (not at [facility])     Status: Abnormal   Collection Time: 08/01/15  8:50 AM  Result Value Ref Range   Color, Urine YELLOW YELLOW   APPearance CLEAR CLEAR   Specific Gravity, Urine 1.014 1.005 - 1.030   pH 7.0 5.0 - 8.0   Glucose, UA NEGATIVE NEGATIVE mg/dL   Hgb urine dipstick MODERATE (A) NEGATIVE   Bilirubin Urine NEGATIVE NEGATIVE   Ketones, ur NEGATIVE NEGATIVE mg/dL   Protein, ur 30 (A) NEGATIVE mg/dL   Nitrite NEGATIVE NEGATIVE   Leukocytes, UA NEGATIVE NEGATIVE  Urine microscopic-add on     Status: Abnormal   Collection Time: 08/01/15  8:50 AM  Result Value Ref Range   Squamous Epithelial / LPF 0-5 (A) NONE SEEN   WBC, UA 0-5 0 - 5 WBC/hpf   RBC / HPF 0-5 0 - 5 RBC/hpf   Bacteria, UA NONE SEEN NONE SEEN  Glucose, capillary     Status: Abnormal   Collection Time: 08/01/15  1:31 PM  Result Value Ref Range   Glucose-Capillary 233 (H) 65 - 99 mg/dL  Glucose, capillary     Status: Abnormal   Collection Time: 08/01/15  4:03 PM  Result Value Ref Range   Glucose-Capillary 209 (H) 65 - 99 mg/dL  Glucose, capillary     Status: Abnormal   Collection Time: 08/01/15  8:38 PM  Result Value Ref Range   Glucose-Capillary 199 (H) 65 - 99 mg/dL  Glucose, capillary     Status: Abnormal   Collection Time: 08/02/15 12:09 AM  Result Value Ref Range   Glucose-Capillary 249 (H) 65 - 99 mg/dL  Heparin level (unfractionated)     Status: None  Collection Time: 08/02/15  2:20 AM  Result Value Ref Range   Heparin Unfractionated 0.50 0.30 - 0.70 IU/mL    Comment:        IF HEPARIN RESULTS ARE BELOW EXPECTED VALUES, AND  PATIENT DOSAGE HAS BEEN CONFIRMED, SUGGEST FOLLOW UP TESTING OF ANTITHROMBIN III LEVELS.   CBC     Status: Abnormal   Collection Time: 08/02/15  2:20 AM  Result Value Ref Range   WBC 10.4 4.0 - 10.5 K/uL   RBC 3.38 (L) 4.22 - 5.81 MIL/uL   Hemoglobin 10.0 (L) 13.0 - 17.0 g/dL   HCT 33.4 (L) 39.0 - 52.0 %   MCV 98.8 78.0 - 100.0 fL   MCH 29.6 26.0 - 34.0 pg   MCHC 29.9 (L) 30.0 - 36.0 g/dL   RDW 16.8 (H) 11.5 - 15.5 %   Platelets 295 150 - 400 K/uL  Basic metabolic panel     Status: Abnormal   Collection Time: 08/02/15  2:20 AM  Result Value Ref Range   Sodium 145 135 - 145 mmol/L   Potassium 3.3 (L) 3.5 - 5.1 mmol/L    Comment: DELTA CHECK NOTED   Chloride 100 (L) 101 - 111 mmol/L   CO2 33 (H) 22 - 32 mmol/L   Glucose, Bld 244 (H) 65 - 99 mg/dL   BUN 30 (H) 6 - 20 mg/dL   Creatinine, Ser 1.49 (H) 0.61 - 1.24 mg/dL   Calcium 8.5 (L) 8.9 - 10.3 mg/dL   GFR calc non Af Amer 44 (L) >60 mL/min   GFR calc Af Amer 51 (L) >60 mL/min    Comment: (NOTE) The eGFR has been calculated using the CKD EPI equation. This calculation has not been validated in all clinical situations. eGFR's persistently <60 mL/min signify possible Chronic Kidney Disease.    Anion gap 12 5 - 15  Glucose, capillary     Status: Abnormal   Collection Time: 08/02/15  4:47 AM  Result Value Ref Range   Glucose-Capillary 288 (H) 65 - 99 mg/dL    Imaging: Dg Chest Port 1 View  07/31/2015  CLINICAL DATA:  Dyspnea EXAM: PORTABLE CHEST 1 VIEW COMPARISON:  07/29/2015 FINDINGS: Shallow inspiration. Cardiac enlargement with mild pulmonary vascular congestion. Diffuse peripheral interstitial pattern to the lungs with hazy infiltration in the lung bases likely due to edema. Probable small left pleural effusion. Mild progression since previous study. An enteric tube is been placed. The tip is off the field of view but below the left hemidiaphragm. Right PICC line is unchanged in position with tip over the low SVC. No  pneumothorax. Calcified and tortuous aorta. Postoperative changes in the thoracic and upper lumbar spine. Probable thoracolumbar scoliosis with convexity towards the right. IMPRESSION: Cardiac enlargement with pulmonary vascular congestion and pulmonary edema, progressing since previous study. Electronically Signed   By: Lucienne Capers M.D.   On: 07/31/2015 22:30    Assessment:  1. Principal Problem: 2.   Sepsis ([place]) 3. Active Problems: 4.   Essential hypertension, benign 5.   Coronary atherosclerosis 6.   Rheumatoid arthritis ([place]) 7.   Acute encephalopathy 8.   LLL pneumonia 9.   Type II or unspecified type diabetes mellitus without mention of complication, not stated as uncontrolled 10.   S/P lumbar laminectomy 11.   Elevated troponin 12.   Acute respiratory failure with hypoxia (H[facility]) 13.   Acute pulmonary edema (H[facility]) 14.   HCAP (healthcare-associated pneumonia) 48.   Palliative care encounter 16.   DNR (  do not resuscitate) discussion 17.   Acute respiratory failure ([place]) 18.   Endotracheally intubated 19.   CHF (congestive heart failure) ([place]) 20.   Change in mental state 21.   Advanced care planning/counseling discussion 22.   Pneumonia 23.   PAF (paroxysmal atrial fibrillation) ([place]) 24.   Counseling regarding advanced care planning and goals of care 25.   Disorientation 26.   Slow transit constipation 27.   Back pain without radiation 28.   Dyspnea 29.   Plan:  1. Remains in a-fib on IV amiodarone. BP improved somewhat today. Not able to take po meds at the moment. Will need to pass swallow evaluation, before we can consider oral anticoagulant. I would favor warfarin for reversibility factor since he has known AVMs and GI bleeding in the past. He is not in condition for TEE/Cardioversion - remains hypoxic, not eating or taking po meds at this point.   Time Spent Directly with Patient:  15 minutes  Length of Stay:  LOS: 16 days   Pixie Casino, MD,  [facility] Attending Cardiologist [place] 08/02/2015, 10:08 AM

## 2015-08-02 NOTE — Progress Notes (Signed)
Speech Language Pathology Dysphagia Treatment Patient Details Name: NYSHAUN JAQUET MRN: [id number] DOB: [birth year] Today's Date: 08/02/2015 Time: [id number] SLP Time Calculation (min) (ACUTE ONLY): 17 min  Assessment / Plan / Recommendation Clinical Impression    Pt more alert and aware during today's session. Pt exhibited immediate coughing, delayed throat clearing and coughing, c/o globus and changes in respirations when observed during PO intake. These s/s could be indicative of pharyngeal dysphagia, however unable to determine at bedside. Pt and son educated re: instrumental study later today. Recommend NPO until results of MBSS (scheduled for 1300) determined.    Diet Recommendation    NPO   SLP Plan New goals to be determined pending instrumental study;MBS      Swallowing Goals     General Behavior/Cognition: Alert;Cooperative;Pleasant mood Patient Positioning: Upright in bed Oral care provided: Yes HPI: 75 y.o. male with h/o HLD, CAD, scoliosis, GI bleed, HTN, myocardial infarction (2010< SOB, PNA, esophageal reflux and type 2 DM with recent laminectomy who presented to ED with c/o confusion and fever. CXR 2/4 revealed LLL atelectasis vs. early infiltrate. Pt intubated 2/8-2/15 (8 days). CXR 2/17 LLL atelectasis or pneumonia.  Oral Cavity - Oral Hygiene     Dysphagia Treatment Family/Caregiver Educated: son Treatment Methods: Skilled observation;Differential diagnosis;Patient/caregiver education Patient observed directly with PO's: Yes Type of PO's observed: Thin liquids;Dysphagia 1 (puree) Feeding: Needs assist;Needs set up Liquids provided via: Cup Oral Phase Signs & Symptoms:  (none found) Pharyngeal Phase Signs & Symptoms: Immediate cough;Delayed throat clear;Delayed cough;Changes in respirations;Complaints of globus Type of cueing: Verbal Amount of cueing: Moderate   GO     Vonnie Spagnolo 08/02/2015, 10:17 AM    [name], Student-SLP

## 2015-08-02 NOTE — Care Management Important Message (Signed)
Important Message  Patient Details  Name: Curtis Frazier MRN: [id number] Date of Birth: [birth year]   Medicare Important Message Given:  Yes    Loann Quill 08/02/2015, 10:48 AM

## 2015-08-03 ENCOUNTER — Inpatient Hospital Stay
Admission: AD | Admit: 2015-08-03 | Discharge: 2015-08-10 | Disposition: A | Discharge status: Other Acute Inpatient Hospital | Payer: Medicare Other | Source: Ambulatory Visit | Attending: Internal Medicine | Admitting: Internal Medicine

## 2015-08-03 DIAGNOSIS — Z515 Encounter for palliative care: Secondary | ICD-10-CM

## 2015-08-03 DIAGNOSIS — R4182 Altered mental status, unspecified: Secondary | ICD-10-CM

## 2015-08-03 DIAGNOSIS — J189 Pneumonia, unspecified organism: Secondary | ICD-10-CM

## 2015-08-03 DIAGNOSIS — I5031 Acute diastolic (congestive) heart failure: Secondary | ICD-10-CM | POA: Insufficient documentation

## 2015-08-03 LAB — CBC
HEMATOCRIT: 32.7 % — AB (ref 39.0–52.0)
HEMOGLOBIN: 10 g/dL — AB (ref 13.0–17.0)
MCH: 30 pg (ref 26.0–34.0)
MCHC: 30.6 g/dL (ref 30.0–36.0)
MCV: 98.2 fL (ref 78.0–100.0)
PLATELETS: 301 10*3/uL (ref 150–400)
RBC: 3.33 MIL/uL — AB (ref 4.22–5.81)
RDW: 16.8 % — ABNORMAL HIGH (ref 11.5–15.5)
WBC: 10.4 10*3/uL (ref 4.0–10.5)

## 2015-08-03 LAB — BASIC METABOLIC PANEL
ANION GAP: 12 (ref 5–15)
BUN: 34 mg/dL — AB (ref 6–20)
CHLORIDE: 102 mmol/L (ref 101–111)
CO2: 33 mmol/L — AB (ref 22–32)
Calcium: 8.7 mg/dL — ABNORMAL LOW (ref 8.9–10.3)
Creatinine, Ser: 1.66 mg/dL — ABNORMAL HIGH (ref 0.61–1.24)
GFR calc Af Amer: 45 mL/min — ABNORMAL LOW (ref >60)
GFR calc non Af Amer: 38 mL/min — ABNORMAL LOW (ref >60)
GLUCOSE: 236 mg/dL — AB (ref 65–99)
POTASSIUM: 3.9 mmol/L (ref 3.5–5.1)
Sodium: 147 mmol/L — ABNORMAL HIGH (ref 135–145)

## 2015-08-03 LAB — MAGNESIUM: Magnesium: 2.3 mg/dL (ref 1.7–2.4)

## 2015-08-03 LAB — GLUCOSE, CAPILLARY
GLUCOSE-CAPILLARY: 199 mg/dL — AB (ref 65–99)
GLUCOSE-CAPILLARY: 161 mg/dL — AB (ref 65–99)
Glucose-Capillary: 184 mg/dL — ABNORMAL HIGH (ref 65–99)
Glucose-Capillary: 286 mg/dL — ABNORMAL HIGH (ref 65–99)
Glucose-Capillary: 89 mg/dL (ref 65–99)

## 2015-08-03 LAB — HEPARIN LEVEL (UNFRACTIONATED): Heparin Unfractionated: 0.64 IU/mL (ref 0.30–0.70)

## 2015-08-03 LAB — VANCOMYCIN, TROUGH: VANCOMYCIN TR: 11 ug/mL (ref 10.0–20.0)

## 2015-08-03 LAB — PROTIME-INR
INR: 1.13 (ref 0.00–1.49)
Prothrombin Time: 14.7 seconds (ref 11.6–15.2)

## 2015-08-03 MED ORDER — INSULIN GLARGINE 100 UNIT/ML ~~LOC~~ SOLN: 10.0000 [IU] | Freq: Every day | SUBCUTANEOUS | Status: DC

## 2015-08-03 MED ORDER — FUROSEMIDE 10 MG/ML IJ SOLN: 40.0000 mg | Freq: Every day | INTRAMUSCULAR | Status: DC

## 2015-08-03 MED ORDER — GUAIFENESIN 100 MG/5ML PO SOLN: 10.0000 mL | ORAL | Status: DC

## 2015-08-03 MED ORDER — PREDNISONE 10 MG PO TABS: 50.0000 mg | ORAL_TABLET | Freq: Every day | ORAL | Status: DC

## 2015-08-03 MED ORDER — WARFARIN - PHARMACIST DOSING INPATIENT: Freq: Every day | Status: DC

## 2015-08-03 MED ORDER — ACETYLCYSTEINE 20 % IN SOLN: 4.0000 mL | Freq: Three times a day (TID) | RESPIRATORY_TRACT | Status: DC

## 2015-08-03 MED ORDER — AMIODARONE HCL 400 MG PO TABS: 400.0000 mg | ORAL_TABLET | Freq: Every day | ORAL | Status: DC

## 2015-08-03 MED ORDER — DILTIAZEM HCL 90 MG PO TABS: 90.0000 mg | ORAL_TABLET | Freq: Four times a day (QID) | ORAL | Status: DC

## 2015-08-03 MED ORDER — HEPARIN (PORCINE) IN NACL 100-0.45 UNIT/ML-% IJ SOLN: 1550.0000 [IU]/h | INTRAMUSCULAR | Status: DC

## 2015-08-03 MED ORDER — LEVALBUTEROL HCL 0.63 MG/3ML IN NEBU: 0.6300 mg | INHALATION_SOLUTION | Freq: Four times a day (QID) | RESPIRATORY_TRACT | Status: DC

## 2015-08-03 MED ORDER — PANTOPRAZOLE SODIUM 40 MG PO PACK: 40.0000 mg | PACK | Freq: Every day | ORAL | Status: DC

## 2015-08-03 MED ORDER — FUROSEMIDE 10 MG/ML IJ SOLN
40.0000 mg | Freq: Every day | INTRAMUSCULAR | Status: DC
  Administered 2015-08-03: 40 mg via INTRAVENOUS
  Filled 2015-08-03: qty 4

## 2015-08-03 MED ORDER — PREDNISONE 50 MG PO TABS: 50.0000 mg | ORAL_TABLET | Freq: Every day | ORAL | Status: DC

## 2015-08-03 MED ORDER — METOPROLOL TARTRATE 12.5 MG HALF TABLET: 12.5000 mg | ORAL_TABLET | Freq: Two times a day (BID) | ORAL | Status: DC

## 2015-08-03 MED ORDER — SODIUM CHLORIDE 0.9 % IV SOLN: 1.0000 g | Freq: Two times a day (BID) | INTRAVENOUS | Status: DC

## 2015-08-03 MED ORDER — WARFARIN SODIUM 5 MG PO TABS: 5.0000 mg | ORAL_TABLET | Freq: Every day | ORAL | Status: DC

## 2015-08-03 MED ORDER — AMIODARONE HCL 200 MG PO TABS
400.0000 mg | ORAL_TABLET | Freq: Every day | ORAL | Status: DC
  Administered 2015-08-03: 400 mg via ORAL
  Filled 2015-08-03: qty 2

## 2015-08-03 MED ORDER — VANCOMYCIN HCL IN DEXTROSE 750-5 MG/150ML-% IV SOLN: 750.0000 mg | INTRAVENOUS | Status: DC

## 2015-08-03 NOTE — Progress Notes (Addendum)
ANTICOAGULATION & ANTIBIOTIC CONSULT NOTE  Pharmacy Consult:  Heparin; Vancomycin  Indication: atrial fibrillation; pnuemonia   Allergies  Allergen Reactions  . Ace Inhibitors Other (See Comments)    Causes high K  . Angiotensin Receptor Blockers Other (See Comments)    Causes high K  . Metoprolol Other (See Comments)    May cause elevated K level  . Penicillins Swelling and Rash  . Tetracycline Swelling and Rash    Patient Measurements: Height: 5\' 8"  (172.7 cm) Weight: 176 lb 9.4 oz (80.1 kg) IBW/kg (Calculated) : 68.4 Heparin Dosing Weight: 89 kg  Vital Signs: Temp: 98.2 F (36.8 C) (02/21 0729) Temp Source: Oral (02/21 0729) BP: 89/62 mmHg (02/21 1000) Pulse Rate: 100 (02/21 1000)  Labs:  Recent Labs  08/01/15 0021 08/01/15 0524 08/02/15 0220 08/03/15 0440  HGB 10.1* 10.0* 10.0* 10.0*  HCT 33.1* 33.7* 33.4* 32.7*  PLT 307 282 295 301  HEPARINUNFRC  --  0.63 0.50 0.64  CREATININE 1.27* 1.43* 1.49* 1.66*  TROPONINI 0.51*  --   --   --     Estimated Creatinine Clearance: 36.6 mL/min (by C-G formula based on Cr of 1.66).    Assessment: 11 YOM continues on IV heparin for new onset afib, CHADSVASc 5, HL was therapeutic x 5 on 1750 units/hr, then INR/aPTT jumped and hep stopped 2/13; restarted hep 2/16.  HL remains therapeutic at 0.64 on Heparin 1550 units/hr. Hgb stable at 10, plts wnl. RN reports no s/s of bleeding   ID: Patient is on meropenem and vancomycin for pneumonia. WBC wnl. SCr bumped up today on lasix therapy. Lasix dose reduced. A vanc trough was drawn today to ensure that level is not supratherapeutic. VT = 11. SInce patient has not had enough doses for Vanc to fully accumulate, will continue the current dose for a few more days prior to dose change.   Goal of Therapy:  Heparin level 0.3-0.7 units/ml Monitor platelets by anticoagulation protocol: Yes   Plan:  Continue heparin gtt at 1550 units/hr Continue Vancomycin 750 mg IV Q 24 hours   Monitor daily HL, CBC, s/s of bleed F/U long-term AC plan   Albertina Parr, PharmD., BCPS Clinical Pharmacist Pager [phone]   Addendum: Now adding Coumadin with plan to bridge with heparin til INR > 2 for planned DCCV.  Start Coumadin 5 mg daily for now and adjust dose as needed. Monitor daily PT/INR.   Albertina Parr, PharmD., BCPS Clinical Pharmacist Pager [phone]

## 2015-08-03 NOTE — Discharge Instructions (Signed)
Information on my medicine - Coumadin   (Warfarin)  This medication education was reviewed with me or my healthcare representative as part of my discharge preparation.  The pharmacist that spoke with me during my hospital stay was:  Lavenia Atlas, [facility]  Why was Coumadin prescribed for you? Coumadin was prescribed for you because you have a blood clot or a medical condition that can cause an increased risk of forming blood clots. Blood clots can cause serious health problems by blocking the flow of blood to the heart, lung, or brain. Coumadin can prevent harmful blood clots from forming. As a reminder your indication for Coumadin is:   Stroke Prevention Because Of Atrial Fibrillation  What test will check on my response to Coumadin? While on Coumadin (warfarin) you will need to have an INR test regularly to ensure that your dose is keeping you in the desired range. The INR (international normalized ratio) number is calculated from the result of the laboratory test called prothrombin time (PT).  If an INR APPOINTMENT HAS NOT ALREADY BEEN MADE FOR YOU please schedule an appointment to have this lab work done by your health care provider within 7 days. Your INR goal is usually a number between:  2 to 3 or your provider may give you a more narrow range like 2-2.5.  Ask your health care provider during an office visit what your goal INR is.  What  do you need to  know  About  COUMADIN? Take Coumadin (warfarin) exactly as prescribed by your healthcare provider about the same time each day.  DO NOT stop taking without talking to the doctor who prescribed the medication.  Stopping without other blood clot prevention medication to take the place of Coumadin may increase your risk of developing a new clot or stroke.  Get refills before you run out.  What do you do if you miss a dose? If you miss a dose, take it as soon as you remember on the same day then continue your regularly scheduled regimen the  next day.  Do not take two doses of Coumadin at the same time.  Important Safety Information A possible side effect of Coumadin (Warfarin) is an increased risk of bleeding. You should call your healthcare provider right away if you experience any of the following: ? Bleeding from an injury or your nose that does not stop. ? Unusual colored urine (red or dark brown) or unusual colored stools (red or black). ? Unusual bruising for unknown reasons. ? A serious fall or if you hit your head (even if there is no bleeding).  Some foods or medicines interact with Coumadin (warfarin) and might alter your response to warfarin. To help avoid this: ? Eat a balanced diet, maintaining a consistent amount of Vitamin K. ? Notify your provider about major diet changes you plan to make. ? Avoid alcohol or limit your intake to 1 drink for women and 2 drinks for men per day. (1 drink is 5 oz. wine, 12 oz. beer, or 1.5 oz. liquor.)  Make sure that ANY health care provider who prescribes medication for you knows that you are taking Coumadin (warfarin).  Also make sure the healthcare provider who is monitoring your Coumadin knows when you have started a new medication including herbals and non-prescription products.  Coumadin (Warfarin)  Major Drug Interactions  Increased Warfarin Effect Decreased Warfarin Effect  Alcohol (large quantities) Antibiotics (esp. Septra/Bactrim, Flagyl, Cipro) Amiodarone (Cordarone) Aspirin (ASA) Cimetidine (Tagamet) Megestrol (Megace) NSAIDs (ibuprofen,  naproxen, etc.) °Piroxicam (Feldene) °Propafenone (Rythmol SR) °Propranolol (Inderal) °Isoniazid (INH) °Posaconazole (Noxafil) Barbiturates (Phenobarbital) °Carbamazepine (Tegretol) °Chlordiazepoxide (Librium) °Cholestyramine (Questran) °Griseofulvin °Oral Contraceptives °Rifampin °Sucralfate (Carafate) °Vitamin K  ° °Coumadin® (Warfarin) Major Herbal Interactions  °Increased Warfarin Effect Decreased Warfarin Effect   °Garlic °Ginseng °Ginkgo biloba Coenzyme Q10 °Green tea °St. John’s wort   ° °Coumadin® (Warfarin) FOOD Interactions  °Eat a consistent number of servings per week of foods HIGH in Vitamin K °(1 serving = ½ cup)  °Collards (cooked, or boiled & drained) °Kale (cooked, or boiled & drained) °Mustard greens (cooked, or boiled & drained) °Parsley *serving size only = ¼ cup °Spinach (cooked, or boiled & drained) °Swiss chard (cooked, or boiled & drained) °Turnip greens (cooked, or boiled & drained)  °Eat a consistent number of servings per week of foods MEDIUM-HIGH in Vitamin K °(1 serving = 1 cup)  °Asparagus (cooked, or boiled & drained) °Broccoli (cooked, boiled & drained, or raw & chopped) °Brussel sprouts (cooked, or boiled & drained) *serving size only = ½ cup °Lettuce, raw (green leaf, endive, romaine) °Spinach, raw °Turnip greens, raw & chopped  ° °These websites have more information on Coumadin (warfarin):  www.coumadin.com; °www.ahrq.gov/consumer/coumadin.htm; ° ° °

## 2015-08-03 NOTE — Care Management Note (Signed)
Case Management Note  Patient Details  Name: Curtis Frazier MRN: [id number] Date of Birth: 1940-08-11  Subjective/Objective:     Per attending, pt is ready for transfer to [place].  CM talked with pt and dtrs, [name] and [name], who in turn talked to other siblings about transfer to [facility].   Everyone agrees that is best option for pt, he and family choose [facility], pt will transfer today.                        Expected Discharge Plan:  Long Term Acute Care (LTAC)  Discharge planning Services  CM Consult  Status of Service:  Completed, signed off  Medicare Important Message Given:  Yes  Girard Cooter, RN 08/03/2015, 3:24 PM

## 2015-08-03 NOTE — Progress Notes (Addendum)
DAILY PROGRESS NOTE  Subjective:  No events overnight. Swallow study results noted. Now on Dys1 diet. Remains in a-fib between 100-110.  Objective:  Temp:  [97.5 F (36.4 C)-98.5 F (36.9 C)] 98.2 F (36.8 C) (02/21 0729) Pulse Rate:  [72-137] 100 (02/21 1000) Resp:  [14-25] 19 (02/21 1000) BP: (89-124)/(55-86) 89/62 mmHg (02/21 1000) SpO2:  [91 %-97 %] 94 % (02/21 1000) FiO2 (%):  [45 %-50 %] 45 % (02/21 0747) Weight:  [176 lb 9.4 oz (80.1 kg)] 176 lb 9.4 oz (80.1 kg) (02/21 0435) Weight change: -9 lb 11.2 oz (-4.4 kg)  Intake/Output from previous day: 02/20 0701 - 02/21 0700 In: 1410.6 [P.O.:120; I.V.:350.6; NG/GT:690; IV Piggyback:250] Out: 6789 [Urine:4525]  Intake/Output from this shift: Total I/O In: 730 [P.O.:480; IV Piggyback:250] Out: 200 [Urine:200]  Medications: Current Facility-Administered Medications  Medication Dose Route Frequency Provider Last Rate Last Dose  . acetaminophen (TYLENOL) tablet 650 mg  650 mg Oral Q4H PRN Willia Craze, NP   650 mg at 08/01/15 0145  . acetylcysteine (MUCOMYST) 20 % nebulizer / oral solution 4 mL  4 mL Nebulization TID Verlee Monte, MD   4 mL at 08/03/15 0745  . amiodarone (NEXTERONE PREMIX) 360 MG/200ML (1.8 mg/mL) IV infusion  30 mg/hr Intravenous Continuous Rogelia Mire, NP 16.7 mL/hr at 08/03/15 0827 30 mg/hr at 08/03/15 0827  . antiseptic oral rinse solution (CORINZ)  7 mL Mouth Rinse QID Erick Colace, NP   7 mL at 08/03/15 0448  . bisacodyl (DULCOLAX) suppository 10 mg  10 mg Rectal Daily PRN Katheren Shams, DO      . chlorhexidine gluconate (PERIDEX) 0.12 % solution 15 mL  15 mL Mouth Rinse BID Erick Colace, NP   15 mL at 08/03/15 3810  . digoxin (LANOXIN) 0.25 MG/ML injection 0.125 mg  0.125 mg Intravenous Daily [name], MD   0.125 mg at 08/03/15 1751  . diltiazem (CARDIZEM) tablet 90 mg  90 mg Oral 4 times per day Verlee Monte, MD   90 mg at 08/03/15 0618  . docusate (COLACE) 50 MG/5ML  liquid 100 mg  100 mg Per Tube BID PRN Erick Colace, NP      . feeding supplement (JEVITY 1.2 CAL) liquid 1,000 mL  1,000 mL Per Tube Continuous [name], MD 65 mL/hr at 08/02/15 1008 1,000 mL at 08/02/15 1008  . feeding supplement (PRO-STAT SUGAR FREE 64) liquid 30 mL  30 mL Per Tube Daily [name], MD   30 mL at 08/03/15 0933  . folic acid (FOLVITE) tablet 1 mg  1 mg Per Tube Daily Erick Colace, NP   1 mg at 08/03/15 0936  . food thickener (THICK IT) powder   Oral PRN Verlee Monte, MD      . free water 300 mL  300 mL Per Tube 3 times per day Verlee Monte, MD   300 mL at 08/02/15 2120  . furosemide (LASIX) injection 40 mg  40 mg Intravenous Daily Verlee Monte, MD   40 mg at 08/03/15 0936  . guaiFENesin (ROBITUSSIN) 100 MG/5ML solution 200 mg  10 mL Oral Q4H Mutaz Elmahi, MD   200 mg at 08/03/15 0932  . heparin ADULT infusion 100 units/mL (25000 units/250 mL)  1,550 Units/hr Intravenous Continuous Reginia Naas, RPH 15.5 mL/hr at 08/02/15 2116 1,550 Units/hr at 08/02/15 2116  . hydrocortisone sodium succinate (SOLU-CORTEF) 100 MG injection 50 mg  50 mg Intravenous Q12H Mutaz  Elmahi, MD   50 mg at 08/03/15 0936  . insulin aspart (novoLOG) injection 0-20 Units  0-20 Units Subcutaneous 6 times per day Katheren Shams, DO   4 Units at 08/03/15 0569  . insulin aspart (novoLOG) injection 3 Units  3 Units Subcutaneous 6 times per day [name], MD   3 Units at 08/02/15 1630  . insulin glargine (LANTUS) injection 10 Units  10 Units Subcutaneous Daily Verlee Monte, MD   10 Units at 08/03/15 0932  . levalbuterol (XOPENEX) nebulizer solution 0.63 mg  0.63 mg Nebulization Q6H Modena Jansky, MD   0.63 mg at 08/03/15 0745  . levalbuterol (XOPENEX) nebulizer solution 0.63 mg  0.63 mg Nebulization Q6H PRN Katheren Shams, DO   0.63 mg at 07/27/15 1153  . meropenem (MERREM) 1 g in sodium chloride 0.9 % 100 mL IVPB  1 g Intravenous Q12H Cecilio Asper Batchelder, RPH   1 g at  08/03/15 7948  . metoprolol tartrate (LOPRESSOR) 25 mg/10 mL oral suspension 12.5 mg  12.5 mg Per Tube BID Verlee Monte, MD   12.5 mg at 08/03/15 0933  . ondansetron (ZOFRAN) injection 4 mg  4 mg Intravenous Q6H PRN Ritta Slot, NP      . pantoprazole sodium (PROTONIX) 40 mg/20 mL oral suspension 40 mg  40 mg Per Tube Daily Cassie Jodean Lima, RPH   40 mg at 08/03/15 0934  . polyethylene glycol (MIRALAX / GLYCOLAX) packet 17 g  17 g Per Tube Daily [name], MD   17 g at 08/03/15 [phone]  . sodium chloride flush (NS) 0.9 % injection 10-40 mL  10-40 mL Intracatheter Q12H Orson Eva, MD   10 mL at 08/03/15 1007  . sodium chloride flush (NS) 0.9 % injection 10-40 mL  10-40 mL Intracatheter PRN Orson Eva, MD   10 mL at 07/19/15 0853  . vancomycin (VANCOCIN) IVPB 750 mg/150 ml premix  750 mg Intravenous Q24H Reginia Naas, RPH   750 mg at 08/03/15 5374    Physical Exam: General appearance: alert, moderate distress and pale Neck: no carotid bruit and no JVD Lungs: diminished breath sounds bibasilar Heart: irregularly irregular rhythm Abdomen: soft, non-tender; bowel sounds normal; no masses,  no organomegaly Extremities: edema 1+ edema Pulses: 2+ and symmetric Skin: pale, cool, dry Neurologic: Mental status: Awake, but not talkative, struggles a little to breath Psych: Flat affect  Lab Results: Results for orders placed or performed during the hospital encounter of 07/17/15 (from the past 48 hour(s))  Glucose, capillary     Status: Abnormal   Collection Time: 08/01/15  1:31 PM  Result Value Ref Range   Glucose-Capillary 233 (H) 65 - 99 mg/dL  Glucose, capillary     Status: Abnormal   Collection Time: 08/01/15  4:03 PM  Result Value Ref Range   Glucose-Capillary 209 (H) 65 - 99 mg/dL  Glucose, capillary     Status: Abnormal   Collection Time: 08/01/15  8:38 PM  Result Value Ref Range   Glucose-Capillary 199 (H) 65 - 99 mg/dL  Glucose, capillary     Status: Abnormal    Collection Time: 08/02/15 12:09 AM  Result Value Ref Range   Glucose-Capillary 249 (H) 65 - 99 mg/dL  Heparin level (unfractionated)     Status: None   Collection Time: 08/02/15  2:20 AM  Result Value Ref Range   Heparin Unfractionated 0.50 0.30 - 0.70 IU/mL    Comment:  IF HEPARIN RESULTS ARE BELOW EXPECTED VALUES, AND PATIENT DOSAGE HAS BEEN CONFIRMED, SUGGEST FOLLOW UP TESTING OF ANTITHROMBIN III LEVELS.   CBC     Status: Abnormal   Collection Time: 08/02/15  2:20 AM  Result Value Ref Range   WBC 10.4 4.0 - 10.5 K/uL   RBC 3.38 (L) 4.22 - 5.81 MIL/uL   Hemoglobin 10.0 (L) 13.0 - 17.0 g/dL   HCT 33.4 (L) 39.0 - 52.0 %   MCV 98.8 78.0 - 100.0 fL   MCH 29.6 26.0 - 34.0 pg   MCHC 29.9 (L) 30.0 - 36.0 g/dL   RDW 16.8 (H) 11.5 - 15.5 %   Platelets 295 150 - 400 K/uL  Basic metabolic panel     Status: Abnormal   Collection Time: 08/02/15  2:20 AM  Result Value Ref Range   Sodium 145 135 - 145 mmol/L   Potassium 3.3 (L) 3.5 - 5.1 mmol/L    Comment: DELTA CHECK NOTED   Chloride 100 (L) 101 - 111 mmol/L   CO2 33 (H) 22 - 32 mmol/L   Glucose, Bld 244 (H) 65 - 99 mg/dL   BUN 30 (H) 6 - 20 mg/dL   Creatinine, Ser 1.49 (H) 0.61 - 1.24 mg/dL   Calcium 8.5 (L) 8.9 - 10.3 mg/dL   GFR calc non Af Amer 44 (L) >60 mL/min   GFR calc Af Amer 51 (L) >60 mL/min    Comment: (NOTE) The eGFR has been calculated using the CKD EPI equation. This calculation has not been validated in all clinical situations. eGFR's persistently <60 mL/min signify possible Chronic Kidney Disease.    Anion gap 12 5 - 15  Glucose, capillary     Status: Abnormal   Collection Time: 08/02/15  4:47 AM  Result Value Ref Range   Glucose-Capillary 288 (H) 65 - 99 mg/dL  Glucose, capillary     Status: Abnormal   Collection Time: 08/02/15  7:42 AM  Result Value Ref Range   Glucose-Capillary 172 (H) 65 - 99 mg/dL  Glucose, capillary     Status: Abnormal   Collection Time: 08/02/15 12:47 PM  Result Value Ref  Range   Glucose-Capillary 208 (H) 65 - 99 mg/dL  Glucose, capillary     Status: Abnormal   Collection Time: 08/02/15  4:27 PM  Result Value Ref Range   Glucose-Capillary 292 (H) 65 - 99 mg/dL  Glucose, capillary     Status: Abnormal   Collection Time: 08/02/15  8:44 PM  Result Value Ref Range   Glucose-Capillary 185 (H) 65 - 99 mg/dL  Glucose, capillary     Status: None   Collection Time: 08/03/15 12:46 AM  Result Value Ref Range   Glucose-Capillary 89 65 - 99 mg/dL  Heparin level (unfractionated)     Status: None   Collection Time: 08/03/15  4:40 AM  Result Value Ref Range   Heparin Unfractionated 0.64 0.30 - 0.70 IU/mL    Comment:        IF HEPARIN RESULTS ARE BELOW EXPECTED VALUES, AND PATIENT DOSAGE HAS BEEN CONFIRMED, SUGGEST FOLLOW UP TESTING OF ANTITHROMBIN III LEVELS.   CBC     Status: Abnormal   Collection Time: 08/03/15  4:40 AM  Result Value Ref Range   WBC 10.4 4.0 - 10.5 K/uL   RBC 3.33 (L) 4.22 - 5.81 MIL/uL   Hemoglobin 10.0 (L) 13.0 - 17.0 g/dL   HCT 32.7 (L) 39.0 - 52.0 %   MCV 98.2 78.0 - 100.0 fL  MCH 30.0 26.0 - 34.0 pg   MCHC 30.6 30.0 - 36.0 g/dL   RDW 16.8 (H) 11.5 - 15.5 %   Platelets 301 150 - 400 K/uL  Basic metabolic panel     Status: Abnormal   Collection Time: 08/03/15  4:40 AM  Result Value Ref Range   Sodium 147 (H) 135 - 145 mmol/L   Potassium 3.9 3.5 - 5.1 mmol/L   Chloride 102 101 - 111 mmol/L   CO2 33 (H) 22 - 32 mmol/L   Glucose, Bld 236 (H) 65 - 99 mg/dL   BUN 34 (H) 6 - 20 mg/dL   Creatinine, Ser 1.66 (H) 0.61 - 1.24 mg/dL   Calcium 8.7 (L) 8.9 - 10.3 mg/dL   GFR calc non Af Amer 38 (L) >60 mL/min   GFR calc Af Amer 45 (L) >60 mL/min    Comment: (NOTE) The eGFR has been calculated using the CKD EPI equation. This calculation has not been validated in all clinical situations. eGFR's persistently <60 mL/min signify possible Chronic Kidney Disease.    Anion gap 12 5 - 15  Magnesium     Status: None   Collection Time:  08/03/15  4:40 AM  Result Value Ref Range   Magnesium 2.3 1.7 - 2.4 mg/dL  Glucose, capillary     Status: Abnormal   Collection Time: 08/03/15  4:43 AM  Result Value Ref Range   Glucose-Capillary 199 (H) 65 - 99 mg/dL  Glucose, capillary     Status: Abnormal   Collection Time: 08/03/15  8:04 AM  Result Value Ref Range   Glucose-Capillary 161 (H) 65 - 99 mg/dL  Vancomycin, trough     Status: None   Collection Time: 08/03/15  9:30 AM  Result Value Ref Range   Vancomycin Tr 11 10.0 - 20.0 ug/mL    Imaging: Dg Swallowing Func-speech Pathology  08/02/2015  Objective Swallowing Evaluation: Type of Study: MBS-Modified Barium Swallow Study Patient Details Name: Curtis Frazier MRN: [id number] Date of Birth: 06-07-1941 Today's Date: 08/02/2015 Time: SLP Start Time (ACUTE ONLY): 1316-SLP Stop Time (ACUTE ONLY): 1332 SLP Time Calculation (min) (ACUTE ONLY): 16 min Past Medical History: Past Medical History Diagnosis Date . Other and unspecified hyperlipidemia  . Coronary atherosclerosis of unspecified type of vessel, native or graft  . Personal history of colonic polyps  . Actinic keratosis  . 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 Other psoriasis  . Scoliosis (and kyphoscoliosis), idiopathic  . GI bleed 12/10   cecam AVM . Gastritis 12/10 . Hyperlipidemia    takes Pravastatin daily . Hypertension    takes Metoprolol daily . Myocardial infarction ([place]) 2010 . Shortness of breath    with exertion . Pneumonia    hx of-2008 . Headache(784.0)    occasionally . Joint pain  . Joint swelling  . Rheumatoid arthritis(714.0)    takes Methotrexate 7pills weekly . Degeneration of intervertebral disc, site unspecified  . Chronic back pain    scoliosis/stenosis/radiculopathy,degenerative disc disease . Psoriasis  . Bruises easily  . Esophageal reflux    takes Omeprazole daily . History of colonic polyps  . Hemorrhoids  . Kidney stone    hx of . Blood transfusion 2010 . Type II or unspecified type diabetes mellitus without mention of  complication, not stated as uncontrolled    type II;controlled by diet and exercise . PMR (polymyalgia rheumatica) (HCC) 01/20/2012   tx by specialist on a long prednisone taper . S/P PTCA (percutaneous transluminal coronary angioplasty)  Past Surgical  History: Past Surgical History Procedure Laterality Date . Rotator cuff repair Left 2000 . Colonoscopy   . Vasectomy  1979 . Coronary angioplasty with stent placement  02/2009   has one stent . Carotid doppler  10/12   0-39% R and 60-79% left  . Cataract extraction  2012 . Cardiac catheterization  2010 . Lithotripsy  2009 HPI: 75 y.o. male with h/o HLD, CAD, scoliosis, GI bleed, HTN, myocardial infarction (2010< SOB, PNA, esophageal reflux and type 2 DM with recent laminectomy who presented to ED with c/o confusion and fever. CXR 2/4 revealed LLL atelectasis vs. early infiltrate. Pt intubated 2/8-2/15 (8 days). CXR 2/17 LLL atelectasis or pneumonia. Pt has been seen by ST since extubation and is appropriate for objective assessment. Subjective: The patient was seen with his breakfast tray sitting in his bedside chair.  Assessment / Plan / Recommendation CHL IP CLINICAL IMPRESSIONS 08/02/2015 Therapy Diagnosis Mild pharyngeal phase dysphagia;Moderate pharyngeal phase dysphagia;Mild oral phase dysphagia Clinical Impression Pt exhibited mild oral phase dysphagia and mild-mod pharyngeal phase dysphagia. Delayed oral transit with thicker consistencies and delayed swallow initiation to the vallecula observed. Reduced laryngeal elevation and closure resulted in penetration during the swallow with honey thick liquid, however administration of liquid via teaspoon appeared to prevent further penetration events. Mod-max vallecular residue occured due to decreased base of tongue retraction. SLP provided mod verbal cues to swallow x2 which was effective to decrease amount. Decreased apneic period and increased effort required during mastication results in dyspnea. Pt and daughter  educated re: results of MBSS and diet recommendation and decreased respiratory status leading to higher aspiration risk. Recommend Dysphagia 1 (puree) diet, honey thick liquids, meds crushed in puree, swallow x2 after each bite and full supervision. SLP will f/u to determine safety with recommended diet/liquids. Impact on safety and function Severe aspiration risk   CHL IP TREATMENT RECOMMENDATION 08/02/2015 Treatment Recommendations Therapy as outlined in treatment plan below   Prognosis 08/02/2015 Prognosis for Safe Diet Advancement Good Barriers to Reach Goals Severity of deficits Barriers/Prognosis Comment -- CHL IP DIET RECOMMENDATION 08/02/2015 SLP Diet Recommendations Dysphagia 1 (Puree) solids;Honey thick liquids Liquid Administration via Spoon Medication Administration Crushed with puree Compensations Minimize environmental distractions;Slow rate;Small sips/bites;Multiple dry swallows after each bite/sip Postural Changes Seated upright at 90 degrees   CHL IP OTHER RECOMMENDATIONS 08/02/2015 Recommended Consults -- Oral Care Recommendations Oral care BID Other Recommendations Have oral suction available;Order thickener from pharmacy;Prohibited food (jello, ice cream, thin soups)   CHL IP FOLLOW UP RECOMMENDATIONS 08/02/2015 Follow up Recommendations Inpatient Rehab   CHL IP FREQUENCY AND DURATION 08/02/2015 Speech Therapy Frequency (ACUTE ONLY) min 2x/week Treatment Duration 2 weeks      CHL IP ORAL PHASE 08/02/2015 Oral Phase Impaired Oral - Pudding Teaspoon -- Oral - Pudding Cup -- Oral - Honey Teaspoon Delayed oral transit Oral - Honey Cup Delayed oral transit Oral - Nectar Teaspoon WFL Oral - Nectar Cup -- Oral - Nectar Straw -- Oral - Thin Teaspoon -- Oral - Thin Cup -- Oral - Thin Straw -- Oral - Puree Delayed oral transit Oral - Mech Soft Delayed oral transit Oral - Regular -- Oral - Multi-Consistency -- Oral - Pill -- Oral Phase - Comment --  CHL IP PHARYNGEAL PHASE 08/02/2015 Pharyngeal Phase Impaired  Pharyngeal- Pudding Teaspoon -- Pharyngeal -- Pharyngeal- Pudding Cup -- Pharyngeal -- Pharyngeal- Honey Teaspoon Penetration/Aspiration during swallow;Delayed swallow initiation-vallecula;Pharyngeal residue - valleculae;Reduced airway/laryngeal closure;Reduced tongue base retraction Pharyngeal Material enters airway, remains ABOVE vocal cords then ejected  out Pharyngeal- Honey Cup Delayed swallow initiation-vallecula;Pharyngeal residue - valleculae;Penetration/Aspiration during swallow;Reduced laryngeal elevation;Reduced airway/laryngeal closure;Reduced epiglottic inversion;Reduced tongue base retraction Pharyngeal Material enters airway, CONTACTS cords and not ejected out Pharyngeal- Nectar Teaspoon Delayed swallow initiation-pyriform sinuses;Pharyngeal residue - valleculae Pharyngeal -- Pharyngeal- Nectar Cup -- Pharyngeal -- Pharyngeal- Nectar Straw -- Pharyngeal -- Pharyngeal- Thin Teaspoon -- Pharyngeal -- Pharyngeal- Thin Cup -- Pharyngeal -- Pharyngeal- Thin Straw -- Pharyngeal -- Pharyngeal- Puree Delayed swallow initiation-vallecula;Pharyngeal residue - valleculae Pharyngeal -- Pharyngeal- Mechanical Soft Pharyngeal residue - valleculae;Delayed swallow initiation-vallecula Pharyngeal -- Pharyngeal- Regular -- Pharyngeal -- Pharyngeal- Multi-consistency -- Pharyngeal -- Pharyngeal- Pill -- Pharyngeal -- Pharyngeal Comment --  CHL IP CERVICAL ESOPHAGEAL PHASE 08/02/2015 Cervical Esophageal Phase WFL Pudding Teaspoon -- Pudding Cup -- Honey Teaspoon -- Honey Cup -- Nectar Teaspoon -- Nectar Cup -- Nectar Straw -- Thin Teaspoon -- Thin Cup -- Thin Straw -- Puree -- Mechanical Soft -- Regular -- Multi-consistency -- Pill -- Cervical Esophageal Comment -- CHL IP GO 07/18/2015 Functional Assessment Tool Used ASHA NOMS and clinical judgment.   Functional Limitations Swallowing Swallow Current Status [phone]) CI Swallow Goal Status (H0865) CI Swallow Discharge Status [phone]) (None) Motor Speech Current Status [phone])  (None) Motor Speech Goal Status [phone]) (None) Motor Speech Goal Status [phone]) (None) Spoken Language Comprehension Current Status [phone]) (None) Spoken Language Comprehension Goal Status (G6440) (None) Spoken Language Comprehension Discharge Status [phone]) (None) Spoken Language Expression Current Status [phone]) (None) Spoken Language Expression Goal Status [phone]) (None) Spoken Language Expression Discharge Status [phone]) (None) Attention Current Status (J8841) (None) Attention Goal Status (Y6063) (None) Attention Discharge Status [phone]) (None) Memory Current Status (U9323) (None) Memory Goal Status (F5732) (None) Memory Discharge Status (K0254) (None) Voice Current Status (Y7062) (None) Voice Goal Status (B7628) (None) Voice Discharge Status (B1517) (None) Other Speech-Language Pathology Functional Limitation [phone]) (None) Other Speech-Language Pathology Functional Limitation Goal Status (P7106) (None) Other Speech-Language Pathology Functional Limitation Discharge Status [phone]) (None) [place] Siren 08/02/2015, 2:36 PM Orbie Pyo Colvin Caroli.Ed CCC-SLP Pager [phone]               Assessment:  Principal Problem:   Sepsis ([place]) Active Problems:   Essential hypertension, benign   Coronary atherosclerosis   Rheumatoid arthritis ([place])   Acute encephalopathy   LLL pneumonia   Type II or unspecified type diabetes mellitus without mention of complication, not stated as uncontrolled   S/P lumbar laminectomy   Elevated troponin   Acute respiratory failure with hypoxia (HCC)   Acute pulmonary edema (HCC)   HCAP (healthcare-associated pneumonia)   Palliative care encounter   DNR (do not resuscitate) discussion   Acute respiratory failure ([place])   Endotracheally intubated   CHF (congestive heart failure) ([place])   Change in mental state   Advanced care planning/counseling discussion   Pneumonia   PAF (paroxysmal atrial fibrillation) ([place])   Counseling regarding advanced care planning and  goals of care   Disorientation   Slow transit constipation   Back pain without radiation   Dyspnea   Plan:  Remains in a-fib on IV amiodarone. Now able to take po.  Change amiodarone to 400 mg po daily. Continue short acting diltiazem more reliable absorption while inpatient, can change to LA prior to d/c. Change metoprolol liquid to po lopressor po 12.5 mg BID. Start warfarin per pharmacy - keep on IV heparin until therapeutic INR between 2-3. Will try to arrange for TEE/Cardioversion later this week - looks like there is availability on Friday with Dr. Oval Linsey. Will  tentatively schedule for Friday.  Time Spent Directly with Patient:  15 minutes  Length of Stay:  LOS: 17 days   Pixie Casino, MD, [facility] Attending Cardiologist [place] 08/03/2015, 11:37 AM

## 2015-08-03 NOTE — Progress Notes (Signed)
Occupational Therapy Treatment Patient Details Name: Curtis Frazier MRN: [id number] DOB: [birth year] Today's Date: 08/03/2015    History of present illness 82 yom who is s/p recent lumbar surg on 2/2. Following d/c on 2/4 developed acute confusion and worsening weakness w/ fever > 102. He was brought to the ER. Admitted w/ working dx of sepsis felt d/t PNA +/- UTI. His hospital course was has consisted of  intermittent fever, AF w/ RVR, hypoxia and confusion. On 2/7 doing better. On 2/8 developed increased SOB, and was placed on NIPPV, this was followed again by fever and then worsening agitation. on 2/8 he was intubated after his agitation escalated to point he would no longer keep his BIPAP on, worsening CHF. Extubated 2/15. Now on high flow nasal cannula 8 L   OT comments  Pt with HR 118 and oxygen desaturation to 86% on 8 L nasal cannula during session. Pt needed cues for pursed lip breathing and rest breaks. Pt unaware of deficits and needed cues to allow breathing to rebound. Pt up in chair with RN staff completing oral medications. Oral hygiene setup for RN to (A) patient at this time.   Follow Up Recommendations  LTACH    Equipment Recommendations  3 in 1 bedside comode;Wheelchair (measurements OT);Wheelchair cushion (measurements OT)    Recommendations for Other Services      Precautions / Restrictions Precautions Precautions: Fall;Back Precaution Comments: Monitor HR (Afib and tachy);BP high Restrictions Weight Bearing Restrictions: No       Mobility Bed Mobility Overal bed mobility: Needs Assistance Bed Mobility: Rolling;Supine to Sit Rolling: Min guard   Supine to sit: Max assist     General bed mobility comments: cues for sequence and back precautions  Transfers Overall transfer level: Needs assistance Equipment used: 2 person hand held assist Transfers: Sit to/from Stand Sit to Stand: +2 physical assistance;Max assist Stand pivot transfers: +2 physical  assistance;Max assist       General transfer comment: bil le buckled pt impulsive and transfering to the chair instead of just standing. pt unaware of deficits and attempting transfer prior to therapist requesting. pt with flexed posture and unable to sustain static standing    Balance Overall balance assessment: Needs assistance Sitting-balance support: Bilateral upper extremity supported;Feet supported Sitting balance-Leahy Scale: Poor     Standing balance support: During functional activity;Bilateral upper extremity supported Standing balance-Leahy Scale: Zero Standing balance comment: bil le buckled                   ADL Overall ADL's : Needs assistance/impaired Eating/Feeding: Minimal assistance;Bed level Eating/Feeding Details (indicate cue type and reason): UE weakness noted Grooming: Wash/dry face;Minimal assistance;Sitting Grooming Details (indicate cue type and reason): (A) due to poor postural control             Lower Body Dressing: Total assistance   Toilet Transfer: +2 for physical assistance;Maximal assistance;Stand-pivot Toilet Transfer Details (indicate cue type and reason): requires bil LE blocked           General ADL Comments: pt agreeable to oob and to chair this session. pt 's son present and pt able to recognize family and recall family from photos in room      Vision                     Perception     Praxis      Cognition   Behavior During Therapy: Flat affect Overall Cognitive Status: Impaired/Different from baseline Area  of Impairment: Following commands;Safety/judgement;Awareness;Problem solving;Memory     Memory: Decreased short-term memory (no recall of back precautions)  Following Commands: Follows one step commands with increased time Safety/Judgement: Decreased awareness of safety;Decreased awareness of deficits Awareness: Emergent Problem Solving: Slow processing;Difficulty sequencing General Comments: pt  impulsive with transfers. pt attempting to transfer instead of standing on command. pt with bil LE buckle and no awareness to deficits    Extremity/Trunk Assessment  Upper Extremity Assessment Upper Extremity Assessment: Generalized weakness   Lower Extremity Assessment Lower Extremity Assessment: Defer to PT evaluation RLE Deficits / Details: buckled with transfer LLE Deficits / Details: buckled with transfer        Exercises Other Exercises Other Exercises: marching at EOB x5 reps each LE, knee extension x10 reps each LE, shoulder flexion to 90 bil UE to assess ROM Other Exercises: knee flexion x 5 reps supine bil LE   Shoulder Instructions       General Comments      Pertinent Vitals/ Pain       Pain Assessment: No/denies pain Faces Pain Scale: Hurts little more Pain Location: back Pain Descriptors / Indicators: Grimacing Pain Intervention(s): Limited activity within patient's tolerance;Monitored during session;Repositioned  Home Living Family/patient expects to be discharged to:: Private residence Living Arrangements: Spouse/significant other Available Help at Discharge: Family;Available PRN/intermittently Type of Home: House Home Access: Stairs to enter [employer] of Steps: 1 Entrance Stairs-Rails: None Home Layout: One level     Bathroom Shower/Tub: Walk-in shower;Door   [employer] Toilet: Programmer, systems: Yes   Home Equipment: None          Prior Functioning/Environment Level of Independence: Independent        Comments: pt is a truck Recruitment consultant 2X/week     Progress Toward Goals  OT Goals(current goals can now be found in the care plan section)  Progress towards OT goals: Progressing toward goals  Acute Rehab OT Goals Patient Stated Goal: I bet you a dollar i can get over there today ( to get into chair) OT Goal Formulation: With patient Time For Goal Achievement: 08/13/15 Potential to Achieve Goals:  Good ADL Goals Pt Will Perform Grooming: with min assist;bed level Pt Will Perform Upper Body Bathing: with mod assist;bed level Pt Will Perform Upper Body Dressing: with mod assist;bed level Pt Will Transfer to Toilet: with mod assist;with +2 assist;squat pivot transfer;stand pivot transfer;bedside commode Pt Will Perform Toileting - Clothing Manipulation and hygiene: with mod assist;sit to/from stand Pt/caregiver will Perform Home Exercise Program: Increased ROM;Increased strength;Both right and left upper extremity;With minimal assist;With written HEP provided Additional ADL Goal #1: Pt will be min A to roll in bed to A with basic ADLs Additional ADL Goal #2: Pt will be able to sit EOB with no more than min A for 5 mins in prep for tranfers and EOB ADLs  Plan Discharge plan remains appropriate    Co-evaluation                 End of Session Equipment Utilized During Treatment: Gait belt;Oxygen   Activity Tolerance Patient tolerated treatment well   Patient Left in chair;with call bell/phone within reach;with nursing/sitter in room;with family/visitor present   Nurse Communication Mobility status;Precautions        Time: [phone] OT Time Calculation (min): 30 min  Charges: OT General Charges $OT Visit: 1 Procedure OT Treatments $Self Care/Home Management : 8-22 mins $Therapeutic Activity: 8-22 mins  Parke Poisson B 08/03/2015, 11:27 AM  Jeri Modena   OTR/L Pager: [phone] Office: [phone] .

## 2015-08-03 NOTE — Progress Notes (Signed)
Daily Progress Note   Patient Name: Curtis Frazier       Date: 08/03/2015 DOB: 1941-05-06  Age: 75 y.o. MRN#: [id number] Attending Physician: Verlee Monte, MD Primary Care Physician: Loura Pardon, MD Admit Date: 07/17/2015  Reason for Consultation/Follow-up: Establishing goals of care and Psychosocial/spiritual support  Subjective:  - Continued emotional support for family and patient    All remain  hopeful for continued improvement and return to baseline.  Plan is for transition to [place]   -importance of continued discussion regarding advanced directives and anticipatory care needs stressed  -questions and concerns addressed  -family encouraged to call with questions or concerns  Length of Stay: 17 days  Current Medications: Scheduled Meds:  . acetylcysteine  4 mL Nebulization TID  . amiodarone  400 mg Oral Daily  . antiseptic oral rinse  7 mL Mouth Rinse QID  . chlorhexidine gluconate  15 mL Mouth Rinse BID  . digoxin  0.125 mg Intravenous Daily  . diltiazem  90 mg Oral 4 times per day  . feeding supplement (PRO-STAT SUGAR FREE 64)  30 mL Per Tube Daily  . folic acid  1 mg Per Tube Daily  . free water  300 mL Per Tube 3 times per day  . furosemide  40 mg Intravenous Daily  . guaiFENesin  10 mL Oral Q4H  . hydrocortisone sod succinate (SOLU-CORTEF) inj  50 mg Intravenous Q12H  . insulin aspart  0-20 Units Subcutaneous 6 times per day  . insulin aspart  3 Units Subcutaneous 6 times per day  . insulin glargine  10 Units Subcutaneous Daily  . levalbuterol  0.63 mg Nebulization Q6H  . meropenem (MERREM) IV  1 g Intravenous Q12H  . metoprolol tartrate  12.5 mg Oral BID  . pantoprazole sodium  40 mg Per Tube Daily  . polyethylene glycol  17 g Per Tube Daily  . sodium chloride flush   10-40 mL Intracatheter Q12H  . vancomycin  750 mg Intravenous Q24H    Continuous Infusions: . feeding supplement (JEVITY 1.2 CAL) 1,000 mL (08/02/15 1008)  . heparin 1,550 Units/hr (08/02/15 2116)    PRN Meds: acetaminophen, bisacodyl, docusate, food thickener, levalbuterol, ondansetron (ZOFRAN) IV, sodium chloride flush  Physical Exam: Physical Exam  Constitutional: He appears well-developed. He appears lethargic. He appears ill. Nasal  cannula in place.  HENT:  Head: Normocephalic.  Mouth/Throat: Oropharynx is clear and moist.  Cardiovascular: Tachycardia present.   Pulmonary/Chest: He has decreased breath sounds in the right lower field and the left lower field. He has wheezes.  Abdominal: Soft. Normal appearance.  Neurological: He appears lethargic.  Skin: Skin is warm and dry.                Vital Signs: BP 105/67 mmHg  Pulse 93  Temp(Src) 98.2 F (36.8 C) (Oral)  Resp 21  Ht 5\' 8"  (1.727 m)  Wt 80.1 kg (176 lb 9.4 oz)  BMI 26.86 kg/m2  SpO2 95% SpO2: SpO2: 95 % O2 Device: O2 Device: Nasal Cannula O2 Flow Rate: O2 Flow Rate (L/min): 6 L/min  Intake/output summary:   Intake/Output Summary (Last 24 hours) at 08/03/15 1206 Last data filed at 08/03/15 1200  Gross per 24 hour  Intake 1300.8 ml  Output   2875 ml  Net -1574.2 ml   LBM: Last BM Date: 08/02/15 Baseline Weight: Weight: 83.915 kg (185 lb) Most recent weight: Weight: 80.1 kg (176 lb 9.4 oz)       Palliative Assessment/Data: Flowsheet Rows        Most Recent Value   Intake Tab    Referral Department  Hospitalist   Unit at Time of Referral  Intermediate Care Unit   Palliative Care Primary Diagnosis  Cardiac   Date Notified  07/19/15   Palliative Care Type  New Palliative care   Reason for referral  Clarify Goals of Care   Date of Admission  07/17/15   Date first seen by Palliative Care  07/20/15   # of days Palliative referral response time  1 Day(s)   # of days IP prior to Palliative referral   2   Clinical Assessment    Psychosocial & Spiritual Assessment    Palliative Care Outcomes       Additional Data Reviewed: CBC    Component Value Date/Time   WBC 10.4 08/03/2015 0440   RBC 3.33* 08/03/2015 0440   RBC 3.52* 12/19/2013 0819   HGB 10.0* 08/03/2015 0440   HCT 32.7* 08/03/2015 0440   PLT 301 08/03/2015 0440   MCV 98.2 08/03/2015 0440   MCH 30.0 08/03/2015 0440   MCHC 30.6 08/03/2015 0440   RDW 16.8* 08/03/2015 0440   LYMPHSABS 1.2 07/26/2015 0422   MONOABS 1.1* 07/26/2015 0422   EOSABS 0.3 07/26/2015 0422   BASOSABS 0.0 07/26/2015 0422    CMP     Component Value Date/Time   NA 147* 08/03/2015 0440   K 3.9 08/03/2015 0440   CL 102 08/03/2015 0440   CO2 33* 08/03/2015 0440   GLUCOSE 236* 08/03/2015 0440   BUN 34* 08/03/2015 0440   CREATININE 1.66* 08/03/2015 0440   CALCIUM 8.7* 08/03/2015 0440   PROT 6.1* 08/01/2015 0021   ALBUMIN 2.3* 08/01/2015 0021   AST 38 08/01/2015 0021   ALT 46 08/01/2015 0021   ALKPHOS 97 08/01/2015 0021   BILITOT 0.7 08/01/2015 0021   GFRNONAA 38* 08/03/2015 0440   GFRAA 45* 08/03/2015 0440       Problem List:  Patient Active Problem List   Diagnosis Date Noted  . Dyspnea   . Disorientation   . Slow transit constipation   . Back pain without radiation   . Counseling regarding advanced care planning and goals of care   . PAF (paroxysmal atrial fibrillation) ([place]) 07/29/2015  . Advanced care planning/counseling discussion   .  Pneumonia   . Change in mental state   . Endotracheally intubated   . CHF (congestive heart failure) ([place])   . Palliative care encounter 07/21/2015  . DNR (do not resuscitate) discussion 07/21/2015  . HCAP (healthcare-associated pneumonia)   . Acute respiratory failure ([place])   . Acute respiratory failure with hypoxia ([place]) 07/18/2015  . Atrial fibrillation with RVR ([place]) 07/18/2015  . Acute pulmonary edema ([place]) 07/18/2015  . Sepsis ([place]) 07/17/2015  . Acute encephalopathy 07/17/2015  .  LLL pneumonia 07/17/2015  . Type II or unspecified type diabetes mellitus without mention of complication, not stated as uncontrolled 07/17/2015  . S/P lumbar laminectomy 07/17/2015  . Elevated troponin 07/17/2015  . Type 2 diabetes mellitus without complication, without long-term current use of insulin ([place])   . Acute bronchitis 03/05/2015  . Back pain, thoracic 01/15/2015  . Chest wall pain 01/15/2015  . Routine general medical examination at a health care facility 11/23/2014  . Productive cough 10/14/2014  . Left flank pain 09/25/2014  . Renal insufficiency 11/14/2013  . Encounter for Medicare annual wellness exam 07/07/2013  . BPH (benign prostatic hyperplasia) 07/05/2012  . Prostate cancer screening 06/27/2012  . Low TSH level 01/22/2012  . PMR (polymyalgia rheumatica) ([facility]) 01/20/2012  . Aortic stenosis 07/08/2011  . New onset a-fib ([place]) 07/05/2011    Class: Acute  . HYPERKALEMIA 02/18/2010  . Essential hypertension, benign 09/13/2009  . ANEMIA-UNSPECIFIED 06/10/2009  . ANGIODYSPLASIA-INTESTINE 06/10/2009  . MURMUR 05/21/2009  . Left carotid bruit 05/21/2009  . Coronary atherosclerosis 03/09/2009  . GERD 03/09/2009  . Rheumatoid arthritis ([place]) 04/29/2008  . HX, PERSONAL, COLONIC POLYPS 01/17/2007  . Diabetes mellitus type 2, controlled, without complications ([place]) [id number]  . Elevated lipids 09/18/2006  . PSORIATIC ARTHRITIS 09/18/2006  . PSORIASIS 09/18/2006  . 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 DEGENERATIVE DISC DISEASE 09/18/2006  . SCOLIOSIS 09/18/2006  . TOBACCO ABUSE, HX OF 09/18/2006     Palliative Care Assessment & Plan    Code Status: Full code- Family is open to all offered and available medical interventions to prolong life.      Code Status Orders        Start     Ordered   07/17/15 2257  Full code   Continuous     07/17/15 2257    Code Status History    Date Active Date Inactive Code Status Order ID Comments User Context   This patient  has a current code status but no historical code status.        Thank you for allowing the Palliative Medicine Team to assist in the care of this patient.  PMT will continue to support holistically   Time In: 1230 Time Out: 1250 Total Time 20 min Prolonged Time Billed  no         Knox Royalty, NP  08/03/2015, 12:06 PM  Please contact Palliative Medicine Team phone at [phone] for questions and concerns.

## 2015-08-03 NOTE — Discharge Summary (Signed)
Physician Discharge Summary  Curtis Frazier [id number] DOB: Dec 06, 1940 DOA: 07/17/2015  PCP: Loura Pardon, MD  Admit date: 07/17/2015 Discharge date: 08/03/2015  Time spent: 40 minutes  Recommendations for Outpatient Follow-up:  [facility]. [facility] 2. Continue to follow with PT/OT/SLP. 3. Recommend cardiology to continue follow-up with A. fib with RVR. 4. Recommend antibiotics for 10 more days, currently on meropenem and vancomycin.   Discharge Diagnoses:  Principal Problem:   Sepsis ([place]) Active Problems:   Essential hypertension, benign   Coronary atherosclerosis   Rheumatoid arthritis ([place])   Acute encephalopathy   LLL pneumonia   Type II or unspecified type diabetes mellitus without mention of complication, not stated as uncontrolled   S/P lumbar laminectomy   Elevated troponin   Acute respiratory failure with hypoxia (HCC)   Acute pulmonary edema (HCC)   HCAP (healthcare-associated pneumonia)   Palliative care encounter   DNR (do not resuscitate) discussion   Acute respiratory failure ([place])   Endotracheally intubated   CHF (congestive heart failure) ([place])   Change in mental state   Advanced care planning/counseling discussion   Pneumonia   PAF (paroxysmal atrial fibrillation) (HCC)   Counseling regarding advanced care planning and goals of care   Disorientation   Slow transit constipation   Back pain without radiation   Dyspnea   Discharge Condition: Fair  Diet recommendation: Dysphagia 1 with honey thick liquids  Filed Weights   08/01/15 0500 08/02/15 0500 08/03/15 0435  Weight: 90.1 kg (198 lb 10.2 oz) 84.5 kg (186 lb 4.6 oz) 80.1 kg (176 lb 9.4 oz)    History of present illness:  75 yo who is s/p recent lumbar surgery on 2/2. Following d/c on 2/4 developed acute confusion and worsening weakness w/ fever > 102. He was brought to the ER 07/17/15 and admitted w/ working dx of sepsis felt due to PNA. Course complicated by shock,  respiratory failure requiring intubation, and atrial fibrillation with RVR. Extubated on 07/28/2015. Since then he was on Venti mask at 50% of oxygen, on 08/03/2015 switched to high flow nasal cannula currently on 8 L of oxygen. Overall he is improving, NG tube discontinued on 08/02/2015 started on dysphagia 1 diet. Still on A. fib with RVR.  Hospital Course:   Acute hypoxic respiratory failure Acute respiratory failure with hypoxia, ventilator dependent. Patient extubated on 07/28/2015. Patient was under PCCM until 2/16 transferred to [facility] for further evaluation. Earlier today placed on high flow nasal cannula, on 8 L of oxygen currently. Continue to wean off of oxygen.  Sepsis Evident on admission, likely secondary to left lower lobe pneumonia. Patient has a recent lumbar laminectomy, MR of the brain showed no evidence of infection. He has some fluid collection likely postoperative seroma, radiology finding consistent with arachnoiditis. Still on meropenem and vancomycin, last culture from 2/19 both urine and blood still NGTD. I recommend Abx for at least 10 days.  Acute on chronic CHF Patient has acute CHF with preserved LVEF of 65%. Still has some fluid overload, continue Lasix. Chest x-ray still showing edema,has a lot of fluids intake secondary to multiple drips and tube feeding. Was on his Lasix 60 mg twice a day, SCr slightly worse than yesterday, but patient off of tube feeding, decrease Lasix to 40 daily.  Atrial fibrillation with rapid ventricular response Appreciate cardiology consultation, patient is on amiodarone, diltiazem, digoxin and metoprolol as needed. Amiodarone switched to oral on 08/03/15, continue diltiazem and metoprolol. On heparin drip, started  on Coumadin by cardiology.  LLL pneumonia  Currently on meropenem and vancomycin. HCAP versus aspiration. Continue deep suctioned to remove secretions. Appears to be improving with less oxygen  requirements.  Hypernatremia Was on free water flushes, improving, sodium improved to 145 today.  Hypokalemia Secondary to diuresis, replete with enteral supplements. magnesium is okay at 2.0.  CK stage III Creatinine baseline is 1.5, around baseline for now.  Status post lumbar laminectomy Has had recent back surgery, according to notes from neurosurgery no evidence of infection. Patient anyway is on broad-spectrum antibiotics.  Rheumatoid arthritis and PMR Patient is on methotrexate and 5 mg of prednisone at home. On Solu-Cortef, 50 twice a day well switched to prednisone and taper quickly down to 5 mg.   Procedures:  Intubation on 07/21/2015.  EEG done on 07/22/2015 showed no epileptiform activity.  Bronchoscopy done on 07/25/2015.  EKG done on 07/26/2015 showed generalized slowing but no epileptiform activity.  Extubation on 07/28/2015.  Consultations:  PCCM.  Infectious diseases.  Neurosurgery.  Discharge Exam: Filed Vitals:   08/03/15 1217 08/03/15 1235  BP: 105/69 118/66  Pulse: 105   Temp:    Resp: 22    General: Alert and awake, oriented x3, not in any acute distress. HEENT: anicteric sclera, pupils reactive to light and accommodation, EOMI CVS: S1-S2 clear, no murmur rubs or gallops Chest: clear to auscultation bilaterally, no wheezing, rales or rhonchi Abdomen: soft nontender, nondistended, normal bowel sounds, no organomegaly Extremities: no cyanosis, clubbing or edema noted bilaterally Neuro: Cranial nerves II-XII intact, no focal neurological deficits   Discharge Instructions   Discharge Instructions    Diet - low sodium heart healthy    Complete by:  As directed      Increase activity slowly    Complete by:  As directed           Current Discharge Medication List    START taking these medications   Details  acetylcysteine (MUCOMYST) 20 % nebulizer solution Take 4 mLs by nebulization 3 (three) times daily. Qty: 30 mL, Refills: 12     amiodarone (PACERONE) 400 MG tablet Take 1 tablet (400 mg total) by mouth daily.    diltiazem (CARDIZEM) 90 MG tablet Take 1 tablet (90 mg total) by mouth every 6 (six) hours.    furosemide (LASIX) 10 MG/ML injection Inject 4 mLs (40 mg total) into the vein daily. Qty: 4 mL, Refills: 0    guaiFENesin (ROBITUSSIN) 100 MG/5ML SOLN Take 10 mLs (200 mg total) by mouth every 4 (four) hours. Qty: 1200 mL, Refills: 0    heparin 100-0.45 UNIT/ML-% infusion Inject 1,550 Units/hr into the vein continuous. Qty: 250 mL    insulin glargine (LANTUS) 100 UNIT/ML injection Inject 0.1 mLs (10 Units total) into the skin daily. Qty: 10 mL, Refills: 11    levalbuterol (XOPENEX) 0.63 MG/3ML nebulizer solution Take 3 mLs (0.63 mg total) by nebulization every 6 (six) hours. Qty: 3 mL, Refills: 12    meropenem 1 g in sodium chloride 0.9 % 100 mL Inject 1 g into the vein every 12 (twelve) hours.    pantoprazole sodium (PROTONIX) 40 mg/20 mL PACK Take 20 mLs (40 mg total) by mouth daily. Qty: 30 each    Vancomycin (VANCOCIN) 750 MG/150ML SOLN Inject 150 mLs (750 mg total) into the vein daily. Qty: 4000 mL    warfarin (COUMADIN) 5 MG tablet Take 1 tablet (5 mg total) by mouth daily at 6 PM.      CONTINUE these  medications which have CHANGED   Details  predniSONE (DELTASONE) 10 MG tablet Take 5 tablets (50 mg total) by mouth daily.      CONTINUE these medications which have NOT CHANGED   Details  Calcium Carbonate-Vit D-Min (CALCIUM 1200 PO) Take 1 tablet by mouth daily.     Cholecalciferol 1000 UNITS capsule Take 1,000 Units by mouth daily.      cyclobenzaprine (FLEXERIL) 10 MG tablet Take 10 mg by mouth 3 (three) times daily as needed for muscle spasms.    fish oil-omega-3 fatty acids 1000 MG capsule Take 1 g by mouth daily.     folic acid (FOLVITE) 1 MG tablet Take 1 mg by mouth daily.     InFLIXimab (REMICADE IV) Inject 100 mg into the vein See admin instructions. Takes every 2 months     nitroGLYCERIN (NITROSTAT) 0.4 MG SL tablet Place 1 tablet (0.4 mg total) under the tongue every 5 (five) minutes as needed for chest pain (x 3 doses). Qty: 25 tablet, Refills: 3    pravastatin (PRAVACHOL) 20 MG tablet TAKE 1 TABLET BY MOUTH DAILY Qty: 30 tablet, Refills: 5    VOLTAREN 1 % GEL Takes 1 application topically daily as needed for pain Refills: 0      STOP taking these medications     aspirin EC 81 MG tablet      clopidogrel (PLAVIX) 75 MG tablet      glipiZIDE (GLUCOTROL XL) 5 MG 24 hr tablet      methotrexate (RHEUMATREX) 2.5 MG tablet      metoprolol succinate (TOPROL-XL) 25 MG 24 hr tablet      omeprazole (PRILOSEC) 20 MG capsule      oxyCODONE-acetaminophen (PERCOCET) 10-325 MG tablet        Allergies  Allergen Reactions  . Ace Inhibitors Other (See Comments)    Causes high K  . Angiotensin Receptor Blockers Other (See Comments)    Causes high K  . Metoprolol Other (See Comments)    May cause elevated K level  . Penicillins Swelling and Rash  . Tetracycline Swelling and Rash      The results of significant diagnostics from this hospitalization (including imaging, microbiology, ancillary and laboratory) are listed below for reference.    Significant Diagnostic Studies: Ct Head Wo Contrast  07/26/2015  CLINICAL DATA:  Altered mental status, difficulty breathing EXAM: CT HEAD WITHOUT CONTRAST TECHNIQUE: Contiguous axial images were obtained from the base of the skull through the vertex without intravenous contrast. COMPARISON:  07/17/2015 FINDINGS: Significant diffuse cortical atrophy with low attenuation in the deep white matter. No hydrocephalus. No evidence of infarct or mass. No hemorrhage or extra-axial fluid. Near complete opacification right maxillary sinus unchanged. Mild inflammatory change ethmoid air cells again identified. Bilateral mastoid air cell fluid worse when compared to prior study. IMPRESSION: Stable ethmoid and right maxillary  sinusitis. Increased severity of bilateral mastoid air cell fluid suggesting possibility of mastoiditis. Electronically Signed   By: Skipper Cliche M.D.   On: 07/26/2015 17:46   Ct Head Wo Contrast  07/17/2015  CLINICAL DATA:  pt awoke this morning "not acting right" has been experiencing acute confusion. Pt w/ back surgery x2 days ago, FEVER 102oriented to location, disoriented to situation and time EXAM: CT HEAD WITHOUT CONTRAST TECHNIQUE: Contiguous axial images were obtained from the base of the skull through the vertex without intravenous contrast. COMPARISON:  None. FINDINGS: Severe diffuse age-related atrophy with moderate low attenuation in the deep white matter. No evidence  of vascular territory infarct or mass. No hemorrhage or extra-axial fluid. No hydrocephalus. The calvarium is intact. There is near complete opacification of the right maxillary sinus. Several bilateral ethmoid air cells are opacified or show inflammatory change particularly on the right. Several mastoid air cells on the right are opacified. IMPRESSION: Age-related involutional change. Significant inflammatory change involving the maxillary sinus, with inflammatory change also involving right mastoid air cells and ethmoid air cells. Electronically Signed   By: Skipper Cliche M.D.   On: 07/17/2015 19:55   Ct Chest Wo Contrast  07/26/2015  CLINICAL DATA:  Altered mental status, difficulty breathing, unknown infection source EXAM: CT CHEST WITHOUT CONTRAST TECHNIQUE: Multidetector CT imaging of the chest was performed following the standard protocol without IV contrast. COMPARISON:  07/26/15 FINDINGS: The patient is intubated with endotracheal tube ending above the carina. NG tube crosses into the stomach. Right PICC line extends to the cavoatrial junction. There is extensive coronary artery calcification. There is calcification of the aortic valves and of the thoracic aorta diffusely. There is cardiac enlargement. There is a small to  moderate pericardial effusion. There is a moderate left pleural effusion and a small right pleural effusion. There are mildly enlarged mediastinal lymph nodes diffusely measuring up to about 12 mm. There is consolidation in both lower lobes. Consolidation is much more extensive on the left and. Posterior dependent consolidation extends into the left upper lobe. Images through the upper abdomen demonstrate no acute findings. There are no acute musculoskeletal findings. Thoracic spine compression deformities are stable from 07/17/2015. IMPRESSION: Significant increase in pleural effusions and bilateral consolidation when compared to 07/17/2015. Consolidation may represent compressive atelectasis but particularly on the left superimposed pneumonia or pneumonitis not excluded. Small to moderate pericardial effusion increased when compared to 07/17/2015. Electronically Signed   By: Skipper Cliche M.D.   On: 07/26/2015 17:53   Ct Angio Chest Pe W/cm &/or Wo Cm  07/17/2015  CLINICAL DATA:  Back surgery 3 days ago with severe chest pain and shortness of breath. EXAM: CT ANGIOGRAPHY CHEST WITH CONTRAST TECHNIQUE: Multidetector CT imaging of the chest was performed using the standard protocol during bolus administration of intravenous contrast. Multiplanar CT image reconstructions and MIPs were obtained to evaluate the vascular anatomy. CONTRAST:  176mL OMNIPAQUE IOHEXOL 350 MG/ML SOLN COMPARISON:  07/05/2011 FINDINGS: THORACIC INLET/BODY WALL: Bilateral thyroid nodules measuring up to 11 mm, likely stable from 2013 when allowing streak artifact on previous study. No acute finding MEDIASTINUM: Mild cardiomegaly. No pericardial effusion. There is mild to moderate aortic valve calcifications/sclerosis without visible progression since 2013. Diffuse atherosclerosis, including the coronary arteries. No evidence of acute aortic syndrome. CTA of the pulmonary arteries is limited by bolus dispersion and respiratory motion at the  bases, primarily affecting subsegmental vessels. There is no visible pulmonary embolism. Patulous esophagus. LUNG WINDOWS: Streaky opacities in the lower lungs with volume loss consistent with atelectasis. Intermittent airway mucus/debris, also seen on the previous study. Interlobular septal thickening at the apices compatible with early edema. Probable emphysema, with uncertainty related to respiratory motion. UPPER ABDOMEN: Cholelithiasis. OSSEOUS: T10 to lumbar posterior fixation. There are compression fractures of the T5, T7, and T8 vertebral bodies without acute/unhealed fracture line visible. Progressive and advanced adjacent segment disc degeneration above the fusion. Review of the MIP images confirms the above findings. IMPRESSION: 1. No evidence of pulmonary embolism. 2. Bibasilar atelectasis. 3. Early pulmonary edema. 4. Study degraded by respiratory motion. Electronically Signed   By: Neva Seat.D.  On: 07/17/2015 21:37   Mr Jeri Cos [id number] Contrast  07/21/2015  CLINICAL DATA:  75 year old male with recent back surgery. Fever, confusion, sepsis. Intubated. Initial encounter. EXAM: MRI HEAD WITHOUT AND WITH CONTRAST TECHNIQUE: Multiplanar, multiecho pulse sequences of the brain and surrounding structures were obtained without and with intravenous contrast. CONTRAST:  34mL MULTIHANCE GADOBENATE DIMEGLUMINE 529 MG/ML IV SOLN COMPARISON:  Head CT without contrast 07/17/2015. FINDINGS: No restricted diffusion to suggest acute infarction. No midline shift, mass effect, evidence of mass lesion, ventriculomegaly, extra-axial collection or acute intracranial hemorrhage. Cervicomedullary junction and pituitary are within normal limits. Negative visualized cervical spine. Major intracranial vascular flow voids are preserved. Venkataraman and white matter signal is within normal limits for age throughout the brain. No cortical encephalomalacia or chronic cerebral blood products. No abnormal enhancement identified. No  dural thickening. Intubated. Small volume fluid in the pharynx. Right greater than left mastoid effusions. Right greater than left paranasal sinus fluid and opacification. Other Visible internal auditory structures appear normal. Postoperative changes to both globes. Negative orbit and scalp soft tissues. Normal bone marrow signal. IMPRESSION: 1. Negative for age MRI appearance of the brain. 2. Mastoid and paranasal sinus fluid/inflammation in the setting of intubation. Electronically Signed   By: Genevie Ann M.D.   On: 07/21/2015 16:41   Mr Lumbar Spine W Wo Contrast  07/21/2015  CLINICAL DATA:  Status post lumbar laminectomy 2 days ago. History of rheumatoid arthritis. Fever and sepsis. Question infection. EXAM: MRI LUMBAR SPINE WITHOUT AND WITH CONTRAST TECHNIQUE: Multiplanar and multiecho pulse sequences of the lumbar spine were obtained without and with intravenous contrast. CONTRAST:  20 cc MultiHance IV. COMPARISON:  MRI lumbar spine 06/09/2015. Single intraoperative view of the lumbar spine 07/15/2015. FINDINGS: The patient is status post T10-L5 fusion with pedicle screws and stabilization bars in place. Extensive artifact from hardware. No evidence of discitis or osteomyelitis is identified. Vertebral body height and alignment are maintained. Descending nerve roots appear clumped from L3-4 to mid L5 suggestive of arachnoiditis. Imaged intra-abdominal contents are unremarkable. The T9-10 to T11-12 levels are imaged in the sagittal plane only. The central canal and foramina appear open. The T12-L1 to L2-3 levels are obscured on axial imaging by artifact from hardware. L3-4: Status post fusion. The central canal and foramina appear open. L4-5: Status post laminectomy and fusion. The central canal and foramina appear open. L5-S1: The patient has a new right laminotomy defect. There is a fluid collection in the surgical bed measuring approximately 3.7 cm AP by 1.1 cm transverse by 2.3 cm craniocaudal. Fluid  extends into the posterior subcutaneous tissues. Posterior to the L5 vertebral body, there is a focus most compatible with a disc protrusion measuring 2.4 cm transverse by 1.1 cm AP x 2.1 cm craniocaudal. The central canal appears decompressed and the disc is smaller than on the prior exam. The foramina are open. IMPRESSION: Interval right laminotomy at L5-S1 for discectomy. Findings compatible with a disc protrusion are identified. The protrusion appears smaller than on the prior examination and the central canal and foramina are decompressed. Fluid collection in the laminectomy bed extending into the subcutaneous soft tissues cannot be definitively characterized but is likely a postoperative seroma given the patient's recent surgery. Status post T10-L5 fusion. The central canal and foramina appear open at these levels although multiple levels are obscured by artifact. 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 Electronically Signed   By: Inge Rise M.D.   On: 07/21/2015 16:50  Dg Chest Port 1 View  07/31/2015  CLINICAL DATA:  Dyspnea EXAM: PORTABLE CHEST 1 VIEW COMPARISON:  07/29/2015 FINDINGS: Shallow inspiration. Cardiac enlargement with mild pulmonary vascular congestion. Diffuse peripheral interstitial pattern to the lungs with hazy infiltration in the lung bases likely due to edema. Probable small left pleural effusion. Mild progression since previous study. An enteric tube is been placed. The tip is off the field of view but below the left hemidiaphragm. Right PICC line is unchanged in position with tip over the low SVC. No pneumothorax. Calcified and tortuous aorta. Postoperative changes in the thoracic and upper lumbar spine. Probable thoracolumbar scoliosis with convexity towards the right. IMPRESSION: Cardiac enlargement with pulmonary vascular congestion and pulmonary edema, progressing since previous study. Electronically Signed   By: Lucienne Capers M.D.   On: 07/31/2015 22:30   Dg Chest Port 1 View  07/29/2015  CLINICAL DATA:  Respiratory failure, CHF, sepsis, acute encephalopathy EXAM: PORTABLE CHEST 1 VIEW COMPARISON:  Portable chest x-ray of July 28, 2015 FINDINGS: The lungs are adequately inflated. The pulmonary interstitial markings are slightly less conspicuous overall today. The right hemidiaphragm is sharp. On the left the hemidiaphragm is now partially visible. The cardiac silhouette remains enlarged. The pulmonary vascularity is engorged but more distinct today. The mediastinum is normal in width. There has been interval extubation of the trachea and of the esophagus. The PICC line tip projects over the junction of the proximal and midportions of the SVC. IMPRESSION: Mild interval improvement in pulmonary interstitial edema secondary to CHF. Decreasing bilateral pleural effusions. Interval extubation of the trachea and esophagus. Electronically Signed   By: David  Martinique M.D.   On: 07/29/2015 07:26   Dg Chest Port 1 View  07/28/2015  CLINICAL DATA:  Ventilator dependent EXAM: PORTABLE CHEST 1 VIEW COMPARISON:  07/26/2015 FINDINGS: Cardiac shadow remains enlarged. An endotracheal tube, nasogastric catheter and right-sided PICC line are again identified and stable. Bilateral pleural effusions and vascular congestion with pulmonary edema are seen. The overall appearance is similar to that noted on the prior exam IMPRESSION: Changes of CHF with bilateral pleural effusions and parenchymal edema. Electronically Signed   By: Inez Catalina M.D.   On: 07/28/2015 07:27   Dg Chest Port 1 View  07/26/2015  CLINICAL DATA:  Follow-up pneumonia EXAM: PORTABLE CHEST 1 VIEW COMPARISON:  Portable chest x-ray of May 23, 2016 FINDINGS: The lungs are mildly hypoinflated. There is left lower lobe atelectasis or pneumonia with small left pleural effusion, stable. Mild infrahilar interstitial prominence on the right also stable. The cardiac  silhouette remains enlarged. The central pulmonary vascularity is mildly prominent. The endotracheal tube tip lies 3.9 cm above the carina. The esophagogastric tube tip projects below the inferior margin of the image. The right-sided PICC line tip projects over the midportion of the SVC. IMPRESSION: Persistent left lower lobe atelectasis and small left pleural effusion. Mild central pulmonary vascular congestion with mild interstitial edema not significantly changed. The support tubes are in reasonable position. Electronically Signed   By: David  Martinique M.D.   On: 07/26/2015 07:21   Dg Chest Port 1 View  07/25/2015  CLINICAL DATA:  75 year old male with sepsis. EXAM: PORTABLE CHEST 1 VIEW COMPARISON:  07/23/2015 and prior studies FINDINGS: The patient is rotated. An endotracheal tube with tip 4 cm above carina, right PICC line with tip overlying the superior cavoatrial junction, NG tube entering the stomach with tip off the field of view again noted. Persistent right lower lung airspace disease/  atelectasis, right perihilar opacity/atelectasis and left lower lung consolidations/atelectasis noted. Pulmonary vascular congestion has decreased. There may be trace pleural effusions. There is no evidence of pneumothorax. IMPRESSION: Decreased pulmonary vascular congestion without other significant change. Continued atelectasis/ airspace disease/ consolidation as described. Electronically Signed   By: Margarette Canada M.D.   On: 07/25/2015 07:26   Dg Chest Port 1 View  07/23/2015  CLINICAL DATA:  75 year old male with ventilator dependent respiratory failure. Sepsis. Initial encounter. EXAM: PORTABLE CHEST 1 VIEW COMPARISON:  07/22/2015 and earlier. FINDINGS: Portable AP semi upright view at 0405 hours. Stable endotracheal tube position. Enteric tube courses to the abdomen, tip not included. Stable right PICC line. Stable cardiomegaly and mediastinal contours. Interval decreased veiling opacity in the lungs but continued  dense retrocardiac opacity at both bases. Partially visible spinal fusion hardware appears stable. No pneumothorax. No overt edema. IMPRESSION: 1.  Stable lines and tubes. 2. Bilateral pleural effusions are less apparent. No overt edema. Continued dense bilateral lower lobe collapse or consolidation. Electronically Signed   By: Genevie Ann M.D.   On: 07/23/2015 06:36   Dg Chest Port 1 View  07/22/2015  CLINICAL DATA:  Ventilator dependent respiratory failure, coronary artery disease, sepsis, acute encephalopathy, left lower lobe pneumonia. EXAM: PORTABLE CHEST 1 VIEW COMPARISON:  Portable chest x-ray of July 21, 2015 FINDINGS: The lungs are reasonably well inflated. There are bilateral pleural effusions layering posteriorly. There is left lower lobe atelectasis or pneumonia. The cardiac silhouette remains enlarged. The pulmonary vascularity remains engorged. The pulmonary interstitial markings are slightly more conspicuous overall today. The endotracheal tube tip lies 4.7 cm above the carina. The esophagogastric tube tip projects below the inferior margin of the image. The PICC line tip projects over the midportion of the SVC. The patient has undergone lower thoracic lumbar an upper lumbar posterior fusion. IMPRESSION: CHF with mild pulmonary interstitial edema slightly more conspicuous today. Persistent left lower lobe atelectasis or pneumonia. Persistent bilateral pleural effusions layering posteriorly. The support tubes are in reasonable position. Electronically Signed   By: David  Martinique M.D.   On: 07/22/2015 08:33   Portable Chest Xray  07/21/2015  CLINICAL DATA:  Hypoxia EXAM: PORTABLE CHEST 1 VIEW COMPARISON:  July 21, 2015 FINDINGS: Endotracheal tube tip is 4.4 cm above the carina. Nasogastric tube tip and side port are below the diaphragm. Central catheter tip is in the superior vena cava. No pneumothorax. There is persistent pulmonary vascular congestion. There is increase in opacity in the left base  region. There is postoperative change in the lower thoracic and visualized upper lumbar region. IMPRESSION: Tube and catheter positions as described without pneumothorax. Increased consolidation left base ; question developing pneumonia or possibly aspiration. Pulmonary vascular congestion with a degree of congestive heart failure remains without change. Electronically Signed   By: Lowella Grip III M.D.   On: 07/21/2015 11:14   Dg Chest Port 1 View  07/21/2015  CLINICAL DATA:  Decreased oxygen saturation EXAM: PORTABLE CHEST 1 VIEW COMPARISON:  July 19, 2015 FINDINGS: Central catheter tip is in the superior vena cava. No pneumothorax. Interstitial edema remains, stable. There is no airspace consolidation. There is cardiomegaly with mild pulmonary venous hypertension. No new opacity. No adenopathy. There is postoperative change in the lower thoracic and visualized upper lumbar region. IMPRESSION: Evidence of a degree of congestive heart failure, stable. No new opacity. No change in cardiac silhouette. Central catheter tip in superior vena cava. No pneumothorax evident. Electronically Signed   By: Gwyndolyn Saxon  Jasmine December III M.D.   On: 07/21/2015 07:52   Dg Chest Port 1 View  07/19/2015  CLINICAL DATA:  Respiratory distress EXAM: PORTABLE CHEST 1 VIEW COMPARISON:  07/18/2015 FINDINGS: Cardiomegaly with vascular congestion and diffuse interstitial prominence compatible with edema. Right PICC line is in place with the tip at the cavoatrial junction. Possible layering left effusion. No acute bony abnormality. IMPRESSION: Findings compatible with CHF.  Possible small left effusion. Electronically Signed   By: Rolm Baptise M.D.   On: 07/19/2015 13:40   Dg Chest Port 1 View  07/18/2015  CLINICAL DATA:  Line placement EXAM: PORTABLE CHEST 1 VIEW COMPARISON:  07/18/2015 FINDINGS: Right-sided PICC line has been placed, tip overlying the level of the superior vena cava. The heart is enlarged. There is pulmonary vascular  congestion. Opacity at the medial left lung base is increased, consistent with developing infiltrate and/or atelectasis. IMPRESSION: 1. Right-sided PICC line tip to the superior vena cava. 2. Developing left lower lobe infiltrate and/or atelectasis. Electronically Signed   By: Nolon Nations M.D.   On: 07/18/2015 14:04   Dg Chest Port 1 View  07/18/2015  CLINICAL DATA:  Shortness of breath. EXAM: PORTABLE CHEST 1 VIEW COMPARISON:  July 17, 2015. FINDINGS: Stable cardiomegaly. No pneumothorax or pleural effusion is noted. Stable mild bibasilar subsegmental atelectasis is noted. Bony thorax is unremarkable. IMPRESSION: Stable mild bibasilar subsegmental atelectasis. Electronically Signed   By: Marijo Conception, M.D.   On: 07/18/2015 09:06   Dg Chest Port 1 View  07/17/2015  CLINICAL DATA:  Pt w/ back surgery x2 days ago, [facility] report temp of 102.0 on arrival and pt given 1gm APAP, pt also w/ SPO2 of 86% on room air, pt placed on simple mask at 6L/min and SPO2 up to 96%. Pt now under sepsis protocol and is experiencing acute confusion EXAM: PORTABLE CHEST 1 VIEW COMPARISON:  01/15/2015 FINDINGS: Heart is mildly enlarged. There are no focal consolidations or pleural effusions. There is mild left lower lobe subsegmental atelectasis or scarring. Less likely this could represent early infectious infiltrate. No evidence for pulmonary edema. Previous thoracic spine fusion. IMPRESSION: 1. Cardiomegaly without pulmonary edema. 2. Left lower lobe atelectasis versus early infiltrate. Followup is recommended. Electronically Signed   By: Nolon Nations M.D.   On: 07/17/2015 18:06   Dg Abd Portable 1v  07/30/2015  CLINICAL DATA:  NG tube placement EXAM: PORTABLE ABDOMEN - 1 VIEW COMPARISON:  Portable abdominal film of July 29, 2015 FINDINGS: The feeding tube is been removed and a nasogastric tube placed. However, the proximal port is at or above the GE junction and the tip lies in the cardia. Advancement by 15-20  cm at least is recommended. There is persistent left lower lobe atelectasis or pneumonia. The gas pattern in the upper abdomen is within the limits of normal. Metallic posterior fusion is devices are present from the lower thoracic spine throughout the lumbar spine. IMPRESSION: Advancement of the nasogastric tube by between 15 and 20 cm is needed to assure that the proximal port is positioned below the GE junction. Left lower lobe atelectasis or pneumonia. Electronically Signed   By: David  Martinique M.D.   On: 07/30/2015 08:01   Dg Abd Portable 1v  07/29/2015  CLINICAL DATA:  Encounter for feeding tube placement. EXAM: PORTABLE ABDOMEN - 1 VIEW COMPARISON:  None. FINDINGS: The bowel gas pattern is normal. Status post surgical fusion of lower thoracic and lumbar spine. Distal tip of feeding tube is seen in expected  position of second portion of duodenum. IMPRESSION: Distal tip of feeding tube seen in expected position of second portion of duodenum. Electronically Signed   By: Marijo Conception, M.D.   On: 07/29/2015 15:08   Dg Swallowing Func-speech Pathology  08/02/2015  Objective Swallowing Evaluation: Type of Study: MBS-Modified Barium Swallow Study Patient Details Name: NICHLAUS LOCKABY MRN: [id number] Date of Birth: 05/04/1941 Today's Date: 08/02/2015 Time: SLP Start Time (ACUTE ONLY): 1316-SLP Stop Time (ACUTE ONLY): 1332 SLP Time Calculation (min) (ACUTE ONLY): 16 min Past Medical History: Past Medical History Diagnosis Date . Other and unspecified hyperlipidemia  . Coronary atherosclerosis of unspecified type of vessel, native or graft  . Personal history of colonic polyps  . Actinic keratosis  . Psoriatic arthropathy ([place])  . Other psoriasis  . Scoliosis (and kyphoscoliosis), idiopathic  . GI bleed 12/10   cecam AVM . Gastritis 12/10 . Hyperlipidemia    takes Pravastatin daily . Hypertension    takes Metoprolol daily . Myocardial infarction ([place]) 2010 . Shortness of breath    with exertion . Pneumonia    hx  of-2008 . Headache(784.0)    occasionally . Joint pain  . Joint swelling  . Rheumatoid arthritis(714.0)    takes Methotrexate 7pills weekly . Degeneration of intervertebral disc, site unspecified  . Chronic back pain    scoliosis/stenosis/radiculopathy,degenerative disc disease . Psoriasis  . Bruises easily  . Esophageal reflux    takes Omeprazole daily . History of colonic polyps  . Hemorrhoids  . Kidney stone    hx of . Blood transfusion 2010 . Type II or unspecified type diabetes mellitus without mention of complication, not stated as uncontrolled    type II;controlled by diet and exercise . PMR (polymyalgia rheumatica) ([facility]) 01/20/2012   tx by specialist on a long prednisone taper . S/P PTCA (percutaneous transluminal coronary angioplasty)  Past Surgical History: Past Surgical History Procedure Laterality Date . Rotator cuff repair Left 2000 . Colonoscopy   . Vasectomy  1979 . Coronary angioplasty with stent placement  02/2009   has one stent . Carotid doppler  10/12   0-39% R and 60-79% left  . Cataract extraction  2012 . Cardiac catheterization  2010 . Lithotripsy  2009 HPI: 75 y.o. male with h/o HLD, CAD, scoliosis, GI bleed, HTN, myocardial infarction (2010< SOB, PNA, esophageal reflux and type 2 DM with recent laminectomy who presented to ED with c/o confusion and fever. CXR 2/4 revealed LLL atelectasis vs. early infiltrate. Pt intubated 2/8-2/15 (8 days). CXR 2/17 LLL atelectasis or pneumonia. Pt has been seen by ST since extubation and is appropriate for objective assessment. Subjective: The patient was seen with his breakfast tray sitting in his bedside chair.  Assessment / Plan / Recommendation CHL IP CLINICAL IMPRESSIONS 08/02/2015 Therapy Diagnosis Mild pharyngeal phase dysphagia;Moderate pharyngeal phase dysphagia;Mild oral phase dysphagia Clinical Impression Pt exhibited mild oral phase dysphagia and mild-mod pharyngeal phase dysphagia. Delayed oral transit with thicker consistencies and delayed  swallow initiation to the vallecula observed. Reduced laryngeal elevation and closure resulted in penetration during the swallow with honey thick liquid, however administration of liquid via teaspoon appeared to prevent further penetration events. Mod-max vallecular residue occured due to decreased base of tongue retraction. SLP provided mod verbal cues to swallow x2 which was effective to decrease amount. Decreased apneic period and increased effort required during mastication results in dyspnea. Pt and daughter educated re: results of MBSS and diet recommendation and decreased respiratory status leading to higher aspiration risk. Recommend  Dysphagia 1 (puree) diet, honey thick liquids, meds crushed in puree, swallow x2 after each bite and full supervision. SLP will f/u to determine safety with recommended diet/liquids. Impact on safety and function Severe aspiration risk   CHL IP TREATMENT RECOMMENDATION 08/02/2015 Treatment Recommendations Therapy as outlined in treatment plan below   Prognosis 08/02/2015 Prognosis for Safe Diet Advancement Good Barriers to Reach Goals Severity of deficits Barriers/Prognosis Comment -- CHL IP DIET RECOMMENDATION 08/02/2015 SLP Diet Recommendations Dysphagia 1 (Puree) solids;Honey thick liquids Liquid Administration via Spoon Medication Administration Crushed with puree Compensations Minimize environmental distractions;Slow rate;Small sips/bites;Multiple dry swallows after each bite/sip Postural Changes Seated upright at 90 degrees   CHL IP OTHER RECOMMENDATIONS 08/02/2015 Recommended Consults -- Oral Care Recommendations Oral care BID Other Recommendations Have oral suction available;Order thickener from pharmacy;Prohibited food (jello, ice cream, thin soups)   CHL IP FOLLOW UP RECOMMENDATIONS 08/02/2015 Follow up Recommendations Inpatient Rehab   CHL IP FREQUENCY AND DURATION 08/02/2015 Speech Therapy Frequency (ACUTE ONLY) min 2x/week Treatment Duration 2 weeks      CHL IP ORAL PHASE  08/02/2015 Oral Phase Impaired Oral - Pudding Teaspoon -- Oral - Pudding Cup -- Oral - Honey Teaspoon Delayed oral transit Oral - Honey Cup Delayed oral transit Oral - Nectar Teaspoon WFL Oral - Nectar Cup -- Oral - Nectar Straw -- Oral - Thin Teaspoon -- Oral - Thin Cup -- Oral - Thin Straw -- Oral - Puree Delayed oral transit Oral - Mech Soft Delayed oral transit Oral - Regular -- Oral - Multi-Consistency -- Oral - Pill -- Oral Phase - Comment --  CHL IP PHARYNGEAL PHASE 08/02/2015 Pharyngeal Phase Impaired Pharyngeal- Pudding Teaspoon -- Pharyngeal -- Pharyngeal- Pudding Cup -- Pharyngeal -- Pharyngeal- Honey Teaspoon Penetration/Aspiration during swallow;Delayed swallow initiation-vallecula;Pharyngeal residue - valleculae;Reduced airway/laryngeal closure;Reduced tongue base retraction Pharyngeal Material enters airway, remains ABOVE vocal cords then ejected out Pharyngeal- Honey Cup Delayed swallow initiation-vallecula;Pharyngeal residue - valleculae;Penetration/Aspiration during swallow;Reduced laryngeal elevation;Reduced airway/laryngeal closure;Reduced epiglottic inversion;Reduced tongue base retraction Pharyngeal Material enters airway, CONTACTS cords and not ejected out Pharyngeal- Nectar Teaspoon Delayed swallow initiation-pyriform sinuses;Pharyngeal residue - valleculae Pharyngeal -- Pharyngeal- Nectar Cup -- Pharyngeal -- Pharyngeal- Nectar Straw -- Pharyngeal -- Pharyngeal- Thin Teaspoon -- Pharyngeal -- Pharyngeal- Thin Cup -- Pharyngeal -- Pharyngeal- Thin Straw -- Pharyngeal -- Pharyngeal- Puree Delayed swallow initiation-vallecula;Pharyngeal residue - valleculae Pharyngeal -- Pharyngeal- Mechanical Soft Pharyngeal residue - valleculae;Delayed swallow initiation-vallecula Pharyngeal -- Pharyngeal- Regular -- Pharyngeal -- Pharyngeal- Multi-consistency -- Pharyngeal -- Pharyngeal- Pill -- Pharyngeal -- Pharyngeal Comment --  CHL IP CERVICAL ESOPHAGEAL PHASE 08/02/2015 Cervical Esophageal Phase WFL  Pudding Teaspoon -- Pudding Cup -- Honey Teaspoon -- Honey Cup -- Nectar Teaspoon -- Nectar Cup -- Nectar Straw -- Thin Teaspoon -- Thin Cup -- Thin Straw -- Puree -- Mechanical Soft -- Regular -- Multi-consistency -- Pill -- Cervical Esophageal Comment -- CHL IP GO 07/18/2015 Functional Assessment Tool Used ASHA NOMS and clinical judgment.   Functional Limitations Swallowing Swallow Current Status [phone]) CI Swallow Goal Status [id number]) CI Swallow Discharge Status [phone]) (None) Motor Speech Current Status [phone]) (None) Motor Speech Goal Status [phone]) (None) Motor Speech Goal Status [phone]) (None) Spoken Language Comprehension Current Status [phone]) (None) Spoken Language Comprehension Goal Status [id number]) (None) Spoken Language Comprehension Discharge Status [phone]) (None) Spoken Language Expression Current Status [phone]) (None) Spoken Language Expression Goal Status [id number]) (None) Spoken Language Expression Discharge Status [id number]) (None) Attention Current Status [id number]) (None) Attention Goal Status [id number]) (None) Attention Discharge Status [id number]) (None) Memory Current  Status [phone]) (None) Memory Goal Status [phone]) (None) Memory Discharge Status [phone]) (None) Voice Current Status [id number]) (None) Voice Goal Status [id number]) (None) Voice Discharge Status [phone]) (None) Other Speech-Language Pathology Functional Limitation [phone]) (None) Other Speech-Language Pathology Functional Limitation Goal Status [id number]) (None) Other Speech-Language Pathology Functional Limitation Discharge Status [phone]) (None) [place] Siren 08/02/2015, 2:36 PM Orbie Pyo Colvin Caroli.Ed CCC-SLP Pager [phone]               Microbiology: Recent Results (from the past 240 hour(s))  Culture, respiratory (NON-Expectorated)     Status: None   Collection Time: 07/25/15  3:20 PM  Result Value Ref Range Status   Specimen Description BRONCHIAL ALVEOLAR LAVAGE  Final   Special Requests NONE  Final   Gram Stain   Final    NO WBC SEEN NO  SQUAMOUS EPITHELIAL CELLS SEEN NO ORGANISMS SEEN Performed at [facility]    Culture   Final    FEW YEAST CONSISTENT WITH CANDIDA SPECIES Performed at [facility]    Report Status 07/27/2015 FINAL  Final  Urine culture     Status: None   Collection Time: 08/01/15  8:50 AM  Result Value Ref Range Status   Specimen Description URINE, RANDOM  Final   Special Requests NONE  Final   Culture NO GROWTH 1 DAY  Final   Report Status 08/02/2015 FINAL  Final  Culture, blood (Routine X 2) w Reflex to ID Panel     Status: None (Preliminary result)   Collection Time: 08/01/15  9:45 AM  Result Value Ref Range Status   Specimen Description BLOOD LEFT WRIST  Final   Special Requests IN PEDIATRIC BOTTLE  1CC  Final   Culture NO GROWTH 1 DAY  Final   Report Status PENDING  Incomplete  Culture, blood (Routine X 2) w Reflex to ID Panel     Status: None (Preliminary result)   Collection Time: 08/01/15  9:55 AM  Result Value Ref Range Status   Specimen Description BLOOD LEFT ARM  Final   Special Requests BOTTLES DRAWN AEROBIC ONLY  5CC  Final   Culture NO GROWTH 1 DAY  Final   Report Status PENDING  Incomplete     Labs: Basic Metabolic Panel:  Recent Labs Lab 07/30/15 1045 07/31/15 0603 08/01/15 0021 08/01/15 0524 08/02/15 0220 08/03/15 0440  NA 153* 149* 151* 146* 145 147*  K 3.1* 3.2* 4.4 4.5 3.3* 3.9  CL 116* 106 109 106 100* 102  CO2 25 31 32 32 33* 33*  GLUCOSE 183* 90 104* 260* 244* 236*  BUN 38* 31* 28* 30* 30* 34*  CREATININE 1.20 1.30* 1.27* 1.43* 1.49* 1.66*  CALCIUM 7.7* 7.9* 8.2* 7.9* 8.5* 8.7*  MG 2.0  --   --   --   --  2.3   Liver Function Tests:  Recent Labs Lab 07/28/15 0832 08/01/15 0021  AST 47* 38  ALT 69* 46  ALKPHOS 135* 97  BILITOT 0.6 0.7  PROT 6.6 6.1*  ALBUMIN 2.3* 2.3*   No results for input(s): LIPASE, AMYLASE in the last 168 hours.  Recent Labs Lab 08/01/15 0022  AMMONIA 28   CBC:  Recent Labs Lab 07/31/15 0603  08/01/15 0021 08/01/15 0524 08/02/15 0220 08/03/15 0440  WBC 12.3* 10.9* 9.9 10.4 10.4  HGB 9.8* 10.1* 10.0* 10.0* 10.0*  HCT 32.5* 33.1* 33.7* 33.4* 32.7*  MCV 99.4 102.5* 99.7 98.8 98.2  PLT 325 307 282 295 301   Cardiac Enzymes:  Recent Labs Lab 08/01/15 0021  TROPONINI 0.51*   BNP: BNP (last 3 results)  Recent Labs  07/18/15 1334 07/19/15 1622 08/01/15 0022  BNP 618.1* 1734.1* 881.4*    ProBNP (last 3 results) No results for input(s): PROBNP in the last 8760 hours.  CBG:  Recent Labs Lab 08/02/15 1627 08/02/15 2044 08/03/15 0046 08/03/15 0443 08/03/15 0804  GLUCAP 292* 185* 89 199* 161*       Signed:  Verlee Monte A MD.  [facility] 08/03/2015, 1:22 PM

## 2015-08-03 NOTE — Progress Notes (Signed)
Pt refused cpt at this time.  

## 2015-08-03 NOTE — Progress Notes (Signed)
Speech Language Pathology Dysphagia Treatment Patient Details Name: Curtis Frazier MRN: [id number] DOB: [birth year] Today's Date: 08/03/2015 Time: [id number] SLP Time Calculation (min) (ACUTE ONLY): 21 min  Assessment / Plan / Recommendation Clinical Impression    Pt with less SOB today than previously and has been changed from ventimask to high flow nasal cannula. Pt unable to recall strategies initially, however SLP provided mod verbal cues and pt was able to implement. At end of session, pt able to recall straegies with min prompting. Pt exhibited immediate cough x1 during intake of honey thick liquids. No oral phase impairments observed. Pt and son educated re: diet recommendation, compensatory strategies and continued dysphagia intervention. Recommend Dysphagia 1 (puree) diet, honey thick liquids via tsp, meds crushed in puree, swallow x2 after every bite/sip and full supervision. SLP will continue to f/u to determine diet toleration and possibility of upgrade to solids.    Diet Recommendation    Dysphagia 1 (puree) diet, honey thick liquids, meds crushed in puree   SLP Plan Continue with current plan of care      Swallowing Goals     General Behavior/Cognition: Alert;Cooperative;Pleasant mood Patient Positioning: Upright in bed Oral care provided: N/A HPI: 75 y.o. male with h/o HLD, CAD, scoliosis, GI bleed, HTN, myocardial infarction (2010< SOB, PNA, esophageal reflux and type 2 DM with recent laminectomy who presented to ED with c/o confusion and fever. CXR 2/4 revealed LLL atelectasis vs. early infiltrate. Pt intubated 2/8-2/15 (8 days). CXR 2/17 LLL atelectasis or pneumonia. Pt has been seen by ST since extubation and is appropriate for objective assessment.  Oral Cavity - Oral Hygiene     Dysphagia Treatment Family/Caregiver Educated: son Treatment Methods: Skilled observation;Differential diagnosis;Compensation strategy training;Patient/caregiver education Patient observed  directly with PO's: Yes Type of PO's observed: Dysphagia 1 (puree);Honey-thick liquids Feeding: Able to feed self;Needs assist Liquids provided via: Teaspoon Oral Phase Signs & Symptoms: Other (comment) (none found) Pharyngeal Phase Signs & Symptoms: Immediate cough (x1) Type of cueing: Verbal Amount of cueing: Moderate   GO     Curtis Frazier 08/03/2015, 9:27 AM   [name], Student-SLP

## 2015-08-03 NOTE — Progress Notes (Signed)
[facility] PROGRESS NOTE   Curtis Frazier [id number] DOB: 04-25-1941 DOA: 07/17/2015 PCP: Loura Pardon, MD  Subjective: Appears to be doing better today, more awake and alert. Started on dysphagia 1 diet with honey thick liquids. Ventimask discontinued, started on high flow nasal cannula. I have asked case management to check with the family and select for LTAC placement.  HPI: 75 yo who is s/p recent lumbar surgery on 2/2. Following d/c on 2/4 developed acute confusion and worsening weakness w/ fever > 102. He was brought to the ER 07/17/15 and admitted w/ working dx of sepsis felt due to PNA. Course complicated by shock, respiratory failure requiring intubation, and atrial fibrillation with RVR. Extubated on 07/28/2015. Since then he was on Venti mask at 50% of oxygen, on 08/03/2015 switched to high flow nasal cannula currently on 8 L of oxygen. Overall he is improving, NG tube discontinued on 08/02/2015 started on dysphagia 1 diet. Still on A. fib with RVR.  Assessment/Plan: Principal Problem:   Sepsis ([place]) Active Problems:   Essential hypertension, benign   Coronary atherosclerosis   Rheumatoid arthritis (HCC)   Acute encephalopathy   LLL pneumonia   Type II or unspecified type diabetes mellitus without mention of complication, not stated as uncontrolled   S/P lumbar laminectomy   Elevated troponin   Acute respiratory failure with hypoxia (HCC)   Acute pulmonary edema (HCC)   HCAP (healthcare-associated pneumonia)   Palliative care encounter   DNR (do not resuscitate) discussion   Acute respiratory failure ([place])   Endotracheally intubated   CHF (congestive heart failure) ([place])   Change in mental state   Advanced care planning/counseling discussion   Pneumonia   PAF (paroxysmal atrial fibrillation) ([place])   Counseling regarding advanced care planning and goals of care   Disorientation   Slow transit constipation   Back pain without radiation   Dyspnea    Acute  hypoxic respiratory failure Acute respiratory failure with hypoxia, ventilator dependent. Patient extubated on 07/28/2015. Patient was under PCCM until 2/16 transferred to [facility] for further evaluation. Earlier today placed on high flow nasal cannula, on 8 L of oxygen currently. Continue to wean off of oxygen.  Sepsis Evident on admission, likely secondary to left lower lobe pneumonia. Patient has a recent lumbar laminectomy, MR of the brain showed no evidence of infection. He has some fluid collection likely postoperative seroma, radiology finding consistent with arachnoiditis. Still on meropenem and vancomycin, last culture from 2/19 both urine and blood still NGTD. I recommend Abx for at least 10 days.  Acute on chronic CHF Patient has acute CHF with preserved LVEF of 65%. Still has some fluid overload, continue Lasix. Chest x-ray still showing edema,has a lot of fluids intake secondary to multiple drips and tube feeding. Was on his Lasix 60 mg twice a day, SCr slightly worse than yesterday, but patient off of tube feeding, decrease Lasix to 40 daily.  Atrial fibrillation with rapid ventricular response Appreciate cardiology consultation, patient is on amiodarone, diltiazem, digoxin and metoprolol as needed. Amiodarone switched to oral on 08/03/15, continue diltiazem and metoprolol. On heparin drip, started on Coumadin by cardiology.  LLL pneumonia  Currently on meropenem and vancomycin. Continue deep suctioned to remove secretions. Appears to be improving with less oxygen requirements.  Hypernatremia On free water flushes, improving,sodium improved to 145 today.  Hypokalemia Secondary to diuresis, replete with enteral supplements. magnesium is okay at 2.0.  CK stage III Creatinine baseline is 1.5, around baseline for now.  Status  post lumbar laminectomy Has had recent back surgery, according to notes from neurosurgery no evidence of infection. Patient anyway is on  broad-spectrum antibiotics.  Rheumatoid arthritis and PMR Patient is on methotrexate and 5 mg of prednisone at home. On Solu-Cortef, 50 twice a day well switched to prednisone and taper quickly down to 5 mg.   Code Status: Full Code Family Communication: Plan discussed with the patient. Disposition Plan: Remains inpatient Diet: DIET - DYS 1 Room service appropriate?: Yes; Fluid consistency:: Honey Thick  Consultants:  Patient transferred to [facility] service on 07/30/2015  Procedures:  None  Antibiotics:  Meropenem and vancomycin   Objective: Filed Vitals:   08/03/15 1115 08/03/15 1145  BP:    Pulse: 86 93  Temp:    Resp: 13 21    Intake/Output Summary (Last 24 hours) at 08/03/15 1216 Last data filed at 08/03/15 1200  Gross per 24 hour  Intake 1300.8 ml  Output   2875 ml  Net -1574.2 ml   Filed Weights   08/01/15 0500 08/02/15 0500 08/03/15 0435  Weight: 90.1 kg (198 lb 10.2 oz) 84.5 kg (186 lb 4.6 oz) 80.1 kg (176 lb 9.4 oz)    Exam: General: Alert and awake, oriented x3, not in any acute distress. HEENT: anicteric sclera, pupils reactive to light and accommodation, EOMI CVS: S1-S2 clear, no murmur rubs or gallops Chest: clear to auscultation bilaterally, no wheezing, rales or rhonchi Abdomen: soft nontender, nondistended, normal bowel sounds, no organomegaly Extremities: no cyanosis, clubbing or edema noted bilaterally Neuro: Cranial nerves II-XII intact, no focal neurological deficits  Data Reviewed: Basic Metabolic Panel:  Recent Labs Lab 07/30/15 1045 07/31/15 0603 08/01/15 0021 08/01/15 0524 08/02/15 0220 08/03/15 0440  NA 153* 149* 151* 146* 145 147*  K 3.1* 3.2* 4.4 4.5 3.3* 3.9  CL 116* 106 109 106 100* 102  CO2 25 31 32 32 33* 33*  GLUCOSE 183* 90 104* 260* 244* 236*  BUN 38* 31* 28* 30* 30* 34*  CREATININE 1.20 1.30* 1.27* 1.43* 1.49* 1.66*  CALCIUM 7.7* 7.9* 8.2* 7.9* 8.5* 8.7*  MG 2.0  --   --   --   --  2.3   Liver Function  Tests:  Recent Labs Lab 07/28/15 0832 08/01/15 0021  AST 47* 38  ALT 69* 46  ALKPHOS 135* 97  BILITOT 0.6 0.7  PROT 6.6 6.1*  ALBUMIN 2.3* 2.3*   No results for input(s): LIPASE, AMYLASE in the last 168 hours.  Recent Labs Lab 08/01/15 0022  AMMONIA 28   CBC:  Recent Labs Lab 07/31/15 0603 08/01/15 0021 08/01/15 0524 08/02/15 0220 08/03/15 0440  WBC 12.3* 10.9* 9.9 10.4 10.4  HGB 9.8* 10.1* 10.0* 10.0* 10.0*  HCT 32.5* 33.1* 33.7* 33.4* 32.7*  MCV 99.4 102.5* 99.7 98.8 98.2  PLT 325 307 282 295 301   Cardiac Enzymes:  Recent Labs Lab 08/01/15 0021  TROPONINI 0.51*   BNP (last 3 results)  Recent Labs  07/18/15 1334 07/19/15 1622 08/01/15 0022  BNP 618.1* 1734.1* 881.4*    ProBNP (last 3 results) No results for input(s): PROBNP in the last 8760 hours.  CBG:  Recent Labs Lab 08/02/15 1627 08/02/15 2044 08/03/15 0046 08/03/15 0443 08/03/15 0804  GLUCAP 292* 185* 89 199* 161*    Micro Recent Results (from the past 240 hour(s))  Culture, respiratory (NON-Expectorated)     Status: None   Collection Time: 07/25/15  3:20 PM  Result Value Ref Range Status   Specimen Description BRONCHIAL ALVEOLAR  LAVAGE  Final   Special Requests NONE  Final   Gram Stain   Final    NO WBC SEEN NO SQUAMOUS EPITHELIAL CELLS SEEN NO ORGANISMS SEEN Performed at [facility]    Culture   Final    FEW YEAST CONSISTENT WITH CANDIDA SPECIES Performed at [facility]    Report Status 07/27/2015 FINAL  Final  Urine culture     Status: None   Collection Time: 08/01/15  8:50 AM  Result Value Ref Range Status   Specimen Description URINE, RANDOM  Final   Special Requests NONE  Final   Culture NO GROWTH 1 DAY  Final   Report Status 08/02/2015 FINAL  Final  Culture, blood (Routine X 2) w Reflex to ID Panel     Status: None (Preliminary result)   Collection Time: 08/01/15  9:45 AM  Result Value Ref Range Status   Specimen Description BLOOD LEFT WRIST   Final   Special Requests IN PEDIATRIC BOTTLE  1CC  Final   Culture NO GROWTH 1 DAY  Final   Report Status PENDING  Incomplete  Culture, blood (Routine X 2) w Reflex to ID Panel     Status: None (Preliminary result)   Collection Time: 08/01/15  9:55 AM  Result Value Ref Range Status   Specimen Description BLOOD LEFT ARM  Final   Special Requests BOTTLES DRAWN AEROBIC ONLY  5CC  Final   Culture NO GROWTH 1 DAY  Final   Report Status PENDING  Incomplete     Studies: Dg Swallowing Func-speech Pathology  08/02/2015  Objective Swallowing Evaluation: Type of Study: MBS-Modified Barium Swallow Study Patient Details Name: Curtis Frazier MRN: [id number] Date of Birth: 07-May-1941 Today's Date: 08/02/2015 Time: SLP Start Time (ACUTE ONLY): 1316-SLP Stop Time (ACUTE ONLY): 1332 SLP Time Calculation (min) (ACUTE ONLY): 16 min Past Medical History: Past Medical History Diagnosis Date . Other and unspecified hyperlipidemia  . Coronary atherosclerosis of unspecified type of vessel, native or graft  . Personal history of colonic polyps  . Actinic keratosis  . Psoriatic arthropathy ([place])  . Other psoriasis  . Scoliosis (and kyphoscoliosis), idiopathic  . GI bleed 12/10   cecam AVM . Gastritis 12/10 . Hyperlipidemia    takes Pravastatin daily . Hypertension    takes Metoprolol daily . Myocardial infarction ([place]) 2010 . Shortness of breath    with exertion . Pneumonia    hx of-2008 . Headache(784.0)    occasionally . Joint pain  . Joint swelling  . Rheumatoid arthritis(714.0)    takes Methotrexate 7pills weekly . Degeneration of intervertebral disc, site unspecified  . Chronic back pain    scoliosis/stenosis/radiculopathy,degenerative disc disease . Psoriasis  . Bruises easily  . Esophageal reflux    takes Omeprazole daily . History of colonic polyps  . Hemorrhoids  . Kidney stone    hx of . Blood transfusion 2010 . Type II or unspecified type diabetes mellitus without mention of complication, not stated as uncontrolled     type II;controlled by diet and exercise . PMR (polymyalgia rheumatica) (HCC) 01/20/2012   tx by specialist on a long prednisone taper . S/P PTCA (percutaneous transluminal coronary angioplasty)  Past Surgical History: Past Surgical History Procedure Laterality Date . Rotator cuff repair Left 2000 . Colonoscopy   . Vasectomy  1979 . Coronary angioplasty with stent placement  02/2009   has one stent . Carotid doppler  10/12   0-39% R and 60-79% left  . Cataract extraction  2012 . Cardiac catheterization  2010 . Lithotripsy  2009 HPI: 75 y.o. male with h/o HLD, CAD, scoliosis, GI bleed, HTN, myocardial infarction (2010< SOB, PNA, esophageal reflux and type 2 DM with recent laminectomy who presented to ED with c/o confusion and fever. CXR 2/4 revealed LLL atelectasis vs. early infiltrate. Pt intubated 2/8-2/15 (8 days). CXR 2/17 LLL atelectasis or pneumonia. Pt has been seen by ST since extubation and is appropriate for objective assessment. Subjective: The patient was seen with his breakfast tray sitting in his bedside chair.  Assessment / Plan / Recommendation CHL IP CLINICAL IMPRESSIONS 08/02/2015 Therapy Diagnosis Mild pharyngeal phase dysphagia;Moderate pharyngeal phase dysphagia;Mild oral phase dysphagia Clinical Impression Pt exhibited mild oral phase dysphagia and mild-mod pharyngeal phase dysphagia. Delayed oral transit with thicker consistencies and delayed swallow initiation to the vallecula observed. Reduced laryngeal elevation and closure resulted in penetration during the swallow with honey thick liquid, however administration of liquid via teaspoon appeared to prevent further penetration events. Mod-max vallecular residue occured due to decreased base of tongue retraction. SLP provided mod verbal cues to swallow x2 which was effective to decrease amount. Decreased apneic period and increased effort required during mastication results in dyspnea. Pt and daughter educated re: results of MBSS and diet  recommendation and decreased respiratory status leading to higher aspiration risk. Recommend Dysphagia 1 (puree) diet, honey thick liquids, meds crushed in puree, swallow x2 after each bite and full supervision. SLP will f/u to determine safety with recommended diet/liquids. Impact on safety and function Severe aspiration risk   CHL IP TREATMENT RECOMMENDATION 08/02/2015 Treatment Recommendations Therapy as outlined in treatment plan below   Prognosis 08/02/2015 Prognosis for Safe Diet Advancement Good Barriers to Reach Goals Severity of deficits Barriers/Prognosis Comment -- CHL IP DIET RECOMMENDATION 08/02/2015 SLP Diet Recommendations Dysphagia 1 (Puree) solids;Honey thick liquids Liquid Administration via Spoon Medication Administration Crushed with puree Compensations Minimize environmental distractions;Slow rate;Small sips/bites;Multiple dry swallows after each bite/sip Postural Changes Seated upright at 90 degrees   CHL IP OTHER RECOMMENDATIONS 08/02/2015 Recommended Consults -- Oral Care Recommendations Oral care BID Other Recommendations Have oral suction available;Order thickener from pharmacy;Prohibited food (jello, ice cream, thin soups)   CHL IP FOLLOW UP RECOMMENDATIONS 08/02/2015 Follow up Recommendations Inpatient Rehab   CHL IP FREQUENCY AND DURATION 08/02/2015 Speech Therapy Frequency (ACUTE ONLY) min 2x/week Treatment Duration 2 weeks      CHL IP ORAL PHASE 08/02/2015 Oral Phase Impaired Oral - Pudding Teaspoon -- Oral - Pudding Cup -- Oral - Honey Teaspoon Delayed oral transit Oral - Honey Cup Delayed oral transit Oral - Nectar Teaspoon WFL Oral - Nectar Cup -- Oral - Nectar Straw -- Oral - Thin Teaspoon -- Oral - Thin Cup -- Oral - Thin Straw -- Oral - Puree Delayed oral transit Oral - Mech Soft Delayed oral transit Oral - Regular -- Oral - Multi-Consistency -- Oral - Pill -- Oral Phase - Comment --  CHL IP PHARYNGEAL PHASE 08/02/2015 Pharyngeal Phase Impaired Pharyngeal- Pudding Teaspoon -- Pharyngeal  -- Pharyngeal- Pudding Cup -- Pharyngeal -- Pharyngeal- Honey Teaspoon Penetration/Aspiration during swallow;Delayed swallow initiation-vallecula;Pharyngeal residue - valleculae;Reduced airway/laryngeal closure;Reduced tongue base retraction Pharyngeal Material enters airway, remains ABOVE vocal cords then ejected out Pharyngeal- Honey Cup Delayed swallow initiation-vallecula;Pharyngeal residue - valleculae;Penetration/Aspiration during swallow;Reduced laryngeal elevation;Reduced airway/laryngeal closure;Reduced epiglottic inversion;Reduced tongue base retraction Pharyngeal Material enters airway, CONTACTS cords and not ejected out Pharyngeal- Nectar Teaspoon Delayed swallow initiation-pyriform sinuses;Pharyngeal residue - valleculae Pharyngeal -- Pharyngeal- Nectar Cup -- Pharyngeal -- Pharyngeal- Nectar  Straw -- Pharyngeal -- Pharyngeal- Thin Teaspoon -- Pharyngeal -- Pharyngeal- Thin Cup -- Pharyngeal -- Pharyngeal- Thin Straw -- Pharyngeal -- Pharyngeal- Puree Delayed swallow initiation-vallecula;Pharyngeal residue - valleculae Pharyngeal -- Pharyngeal- Mechanical Soft Pharyngeal residue - valleculae;Delayed swallow initiation-vallecula Pharyngeal -- Pharyngeal- Regular -- Pharyngeal -- Pharyngeal- Multi-consistency -- Pharyngeal -- Pharyngeal- Pill -- Pharyngeal -- Pharyngeal Comment --  CHL IP CERVICAL ESOPHAGEAL PHASE 08/02/2015 Cervical Esophageal Phase WFL Pudding Teaspoon -- Pudding Cup -- Honey Teaspoon -- Honey Cup -- Nectar Teaspoon -- Nectar Cup -- Nectar Straw -- Thin Teaspoon -- Thin Cup -- Thin Straw -- Puree -- Mechanical Soft -- Regular -- Multi-consistency -- Pill -- Cervical Esophageal Comment -- CHL IP GO 07/18/2015 Functional Assessment Tool Used ASHA NOMS and clinical judgment.   Functional Limitations Swallowing Swallow Current Status [phone]) CI Swallow Goal Status [id number]) CI Swallow Discharge Status [phone]) (None) Motor Speech Current Status [phone]) (None) Motor Speech Goal Status [phone])  (None) Motor Speech Goal Status [phone]) (None) Spoken Language Comprehension Current Status [phone]) (None) Spoken Language Comprehension Goal Status [id number]) (None) Spoken Language Comprehension Discharge Status [phone]) (None) Spoken Language Expression Current Status [phone]) (None) Spoken Language Expression Goal Status [phone]) (None) Spoken Language Expression Discharge Status [phone]) (None) Attention Current Status [id number]) (None) Attention Goal Status [id number]) (None) Attention Discharge Status [phone]) (None) Memory Current Status [id number]) (None) Memory Goal Status [id number]) (None) Memory Discharge Status [id number]) (None) Voice Current Status [id number]) (None) Voice Goal Status [id number]) (None) Voice Discharge Status [id number]) (None) Other Speech-Language Pathology Functional Limitation [phone]) (None) Other Speech-Language Pathology Functional Limitation Goal Status [id number]) (None) Other Speech-Language Pathology Functional Limitation Discharge Status [phone]) (None) [place] Siren 08/02/2015, 2:36 PM Orbie Pyo Colvin Caroli.Ed CCC-SLP Pager [phone]               Scheduled Meds: . acetylcysteine  4 mL Nebulization TID  . amiodarone  400 mg Oral Daily  . antiseptic oral rinse  7 mL Mouth Rinse QID  . chlorhexidine gluconate  15 mL Mouth Rinse BID  . digoxin  0.125 mg Intravenous Daily  . diltiazem  90 mg Oral 4 times per day  . feeding supplement (PRO-STAT SUGAR FREE 64)  30 mL Per Tube Daily  . folic acid  1 mg Per Tube Daily  . free water  300 mL Per Tube 3 times per day  . furosemide  40 mg Intravenous Daily  . guaiFENesin  10 mL Oral Q4H  . hydrocortisone sod succinate (SOLU-CORTEF) inj  50 mg Intravenous Q12H  . insulin aspart  0-20 Units Subcutaneous 6 times per day  . insulin aspart  3 Units Subcutaneous 6 times per day  . insulin glargine  10 Units Subcutaneous Daily  . levalbuterol  0.63 mg Nebulization Q6H  . meropenem (MERREM) IV  1 g Intravenous Q12H  . metoprolol tartrate  12.5 mg Oral BID  .  pantoprazole sodium  40 mg Per Tube Daily  . polyethylene glycol  17 g Per Tube Daily  . sodium chloride flush  10-40 mL Intracatheter Q12H  . vancomycin  750 mg Intravenous Q24H   Continuous Infusions: . feeding supplement (JEVITY 1.2 CAL) 1,000 mL (08/02/15 1008)  . heparin 1,550 Units/hr (08/02/15 2116)       Time spent: 35 minutes    [facility]  [facility] Pager [phone] If 7PM-7AM, please contact night-coverage at [email or web address], password [facility] 08/03/2015, 12:16 PM  LOS: 17 days

## 2015-08-04 DIAGNOSIS — I5031 Acute diastolic (congestive) heart failure: Secondary | ICD-10-CM

## 2015-08-04 DIAGNOSIS — I481 Persistent atrial fibrillation: Secondary | ICD-10-CM

## 2015-08-04 LAB — BASIC METABOLIC PANEL
ANION GAP: 17 — AB (ref 5–15)
BUN: 26 mg/dL — AB (ref 6–20)
CHLORIDE: 104 mmol/L (ref 101–111)
CO2: 31 mmol/L (ref 22–32)
Calcium: 8.9 mg/dL (ref 8.9–10.3)
Creatinine, Ser: 1.57 mg/dL — ABNORMAL HIGH (ref 0.61–1.24)
GFR, EST AFRICAN AMERICAN: 48 mL/min — AB (ref >60)
GFR, EST NON AFRICAN AMERICAN: 41 mL/min — AB (ref >60)
Glucose, Bld: 132 mg/dL — ABNORMAL HIGH (ref 65–99)
POTASSIUM: 3 mmol/L — AB (ref 3.5–5.1)
SODIUM: 152 mmol/L — AB (ref 135–145)

## 2015-08-04 LAB — PROTIME-INR
INR: 1.22 (ref 0.00–1.49)
PROTHROMBIN TIME: 15.5 s — AB (ref 11.6–15.2)

## 2015-08-04 LAB — CBC
HCT: 35.4 % — ABNORMAL LOW (ref 39.0–52.0)
HEMOGLOBIN: 10.9 g/dL — AB (ref 13.0–17.0)
MCH: 30.8 pg (ref 26.0–34.0)
MCHC: 30.8 g/dL (ref 30.0–36.0)
MCV: 100 fL (ref 78.0–100.0)
PLATELETS: 270 10*3/uL (ref 150–400)
RBC: 3.54 MIL/uL — AB (ref 4.22–5.81)
RDW: 16.7 % — ABNORMAL HIGH (ref 11.5–15.5)
WBC: 10.1 10*3/uL (ref 4.0–10.5)

## 2015-08-04 LAB — PHOSPHORUS: PHOSPHORUS: 2.8 mg/dL (ref 2.5–4.6)

## 2015-08-04 LAB — VANCOMYCIN, TROUGH: VANCOMYCIN TR: 16 ug/mL (ref 10.0–20.0)

## 2015-08-04 LAB — MAGNESIUM: MAGNESIUM: 2 mg/dL (ref 1.7–2.4)

## 2015-08-05 ENCOUNTER — Other Ambulatory Visit (HOSPITAL_COMMUNITY): Payer: Self-pay

## 2015-08-05 LAB — BASIC METABOLIC PANEL
ANION GAP: 10 (ref 5–15)
BUN: 32 mg/dL — ABNORMAL HIGH (ref 6–20)
CALCIUM: 9.8 mg/dL (ref 8.9–10.3)
CHLORIDE: 111 mmol/L (ref 101–111)
CO2: 30 mmol/L (ref 22–32)
Creatinine, Ser: 1.82 mg/dL — ABNORMAL HIGH (ref 0.61–1.24)
GFR calc Af Amer: 40 mL/min — ABNORMAL LOW (ref >60)
GFR calc non Af Amer: 34 mL/min — ABNORMAL LOW (ref >60)
GLUCOSE: 168 mg/dL — AB (ref 65–99)
POTASSIUM: 3.9 mmol/L (ref 3.5–5.1)
Sodium: 151 mmol/L — ABNORMAL HIGH (ref 135–145)

## 2015-08-05 LAB — URINALYSIS, ROUTINE W REFLEX MICROSCOPIC
Bilirubin Urine: NEGATIVE
GLUCOSE, UA: 250 mg/dL — AB
HGB URINE DIPSTICK: NEGATIVE
Ketones, ur: NEGATIVE mg/dL
LEUKOCYTES UA: NEGATIVE
Nitrite: NEGATIVE
PH: 7.5 (ref 5.0–8.0)
Protein, ur: NEGATIVE mg/dL
SPECIFIC GRAVITY, URINE: 1.013 (ref 1.005–1.030)

## 2015-08-05 LAB — CBC WITH DIFFERENTIAL/PLATELET
Basophils Absolute: 0 10*3/uL (ref 0.0–0.1)
Basophils Relative: 0 %
EOS PCT: 0 %
Eosinophils Absolute: 0 10*3/uL (ref 0.0–0.7)
HEMATOCRIT: 35.9 % — AB (ref 39.0–52.0)
Hemoglobin: 11.1 g/dL — ABNORMAL LOW (ref 13.0–17.0)
LYMPHS ABS: 1.8 10*3/uL (ref 0.7–4.0)
LYMPHS PCT: 17 %
MCH: 31.3 pg (ref 26.0–34.0)
MCHC: 30.9 g/dL (ref 30.0–36.0)
MCV: 101.1 fL — AB (ref 78.0–100.0)
Monocytes Absolute: 1.1 10*3/uL — ABNORMAL HIGH (ref 0.1–1.0)
Monocytes Relative: 10 %
NEUTROS ABS: 8.1 10*3/uL — AB (ref 1.7–7.7)
Neutrophils Relative %: 73 %
PLATELETS: 248 10*3/uL (ref 150–400)
RBC: 3.55 MIL/uL — AB (ref 4.22–5.81)
RDW: 16.7 % — ABNORMAL HIGH (ref 11.5–15.5)
WBC: 11.1 10*3/uL — AB (ref 4.0–10.5)

## 2015-08-05 LAB — PROTIME-INR
INR: 1.61 — AB (ref 0.00–1.49)
Prothrombin Time: 19.1 seconds — ABNORMAL HIGH (ref 11.6–15.2)

## 2015-08-05 LAB — MAGNESIUM: Magnesium: 2 mg/dL (ref 1.7–2.4)

## 2015-08-05 LAB — PHOSPHORUS: Phosphorus: 4.8 mg/dL — ABNORMAL HIGH (ref 2.5–4.6)

## 2015-08-06 ENCOUNTER — Institutional Professional Consult (permissible substitution) (HOSPITAL_COMMUNITY)
Admission: AD | Admit: 2015-08-06 | Discharge: 2015-08-06 | Disposition: A | Discharge status: Home / Self Care | Payer: Self-pay | Source: Ambulatory Visit | Attending: Internal Medicine | Admitting: Internal Medicine

## 2015-08-06 ENCOUNTER — Encounter (HOSPITAL_COMMUNITY): Payer: Self-pay | Admitting: Certified Registered Nurse Anesthetist

## 2015-08-06 ENCOUNTER — Encounter
Admission: AD | Disposition: A | Discharge status: Other Acute Inpatient Hospital | Payer: Self-pay | Source: Ambulatory Visit | Attending: Internal Medicine

## 2015-08-06 ENCOUNTER — Encounter: Payer: Self-pay | Admitting: Cardiovascular Disease

## 2015-08-06 DIAGNOSIS — I340 Nonrheumatic mitral (valve) insufficiency: Secondary | ICD-10-CM

## 2015-08-06 DIAGNOSIS — I4891 Unspecified atrial fibrillation: Secondary | ICD-10-CM

## 2015-08-06 HISTORY — PX: CARDIOVERSION: SHX1299

## 2015-08-06 HISTORY — PX: TEE WITHOUT CARDIOVERSION: SHX5443

## 2015-08-06 LAB — BASIC METABOLIC PANEL
ANION GAP: 10 (ref 5–15)
BUN: 29 mg/dL — ABNORMAL HIGH (ref 6–20)
CO2: 29 mmol/L (ref 22–32)
Calcium: 9.1 mg/dL (ref 8.9–10.3)
Chloride: 110 mmol/L (ref 101–111)
Creatinine, Ser: 1.58 mg/dL — ABNORMAL HIGH (ref 0.61–1.24)
GFR calc non Af Amer: 41 mL/min — ABNORMAL LOW (ref >60)
GFR, EST AFRICAN AMERICAN: 47 mL/min — AB (ref >60)
GLUCOSE: 145 mg/dL — AB (ref 65–99)
POTASSIUM: 3.5 mmol/L (ref 3.5–5.1)
Sodium: 149 mmol/L — ABNORMAL HIGH (ref 135–145)

## 2015-08-06 LAB — CBC WITH DIFFERENTIAL/PLATELET
BASOS ABS: 0 10*3/uL (ref 0.0–0.1)
Basophils Relative: 0 %
Eosinophils Absolute: 0.2 10*3/uL (ref 0.0–0.7)
Eosinophils Relative: 1 %
HEMATOCRIT: 35.1 % — AB (ref 39.0–52.0)
HEMOGLOBIN: 10.8 g/dL — AB (ref 13.0–17.0)
Lymphocytes Relative: 20 %
Lymphs Abs: 2.5 10*3/uL (ref 0.7–4.0)
MCH: 31 pg (ref 26.0–34.0)
MCHC: 30.8 g/dL (ref 30.0–36.0)
MCV: 100.9 fL — AB (ref 78.0–100.0)
Monocytes Absolute: 0.8 10*3/uL (ref 0.1–1.0)
Monocytes Relative: 6 %
NEUTROS ABS: 9.1 10*3/uL — AB (ref 1.7–7.7)
NEUTROS PCT: 73 %
Platelets: 209 10*3/uL (ref 150–400)
RBC: 3.48 MIL/uL — AB (ref 4.22–5.81)
RDW: 16.7 % — ABNORMAL HIGH (ref 11.5–15.5)
WBC: 12.5 10*3/uL — AB (ref 4.0–10.5)

## 2015-08-06 LAB — CULTURE, BLOOD (ROUTINE X 2)
CULTURE: NO GROWTH
Culture: NO GROWTH

## 2015-08-06 LAB — VANCOMYCIN, TROUGH
Vancomycin Tr: 29 ug/mL (ref 10.0–20.0)
Vancomycin Tr: 19 ug/mL (ref 10.0–20.0)

## 2015-08-06 LAB — URINE CULTURE: Culture: 5000

## 2015-08-06 LAB — PROTIME-INR
INR: 2.4 — AB (ref 0.00–1.49)
PROTHROMBIN TIME: 25.9 s — AB (ref 11.6–15.2)

## 2015-08-06 LAB — PHOSPHORUS: PHOSPHORUS: 3.7 mg/dL (ref 2.5–4.6)

## 2015-08-06 LAB — MAGNESIUM: Magnesium: 1.9 mg/dL (ref 1.7–2.4)

## 2015-08-06 SURGERY — ECHOCARDIOGRAM, TRANSESOPHAGEAL
Anesthesia: Monitor Anesthesia Care

## 2015-08-06 SURGERY — CARDIOVERSION
Anesthesia: Monitor Anesthesia Care

## 2015-08-06 MED ORDER — SODIUM CHLORIDE 0.9 % IV SOLN
INTRAVENOUS | Status: DC
  Administered 2015-08-06: 14:00:00 via INTRAVENOUS

## 2015-08-06 MED ORDER — PROPOFOL 500 MG/50ML IV EMUL
INTRAVENOUS | Status: DC | PRN
  Administered 2015-08-06: 50 ug/kg/min via INTRAVENOUS

## 2015-08-06 NOTE — Transfer of Care (Signed)
Immediate Anesthesia Transfer of Care Note  Patient: Curtis Frazier  Procedure(s) Performed: Procedure(s): TRANSESOPHAGEAL ECHOCARDIOGRAM (TEE) (N/A) CARDIOVERSION (N/A)  Patient Location: PACU and Endoscopy Unit  Anesthesia Type:MAC  Level of Consciousness: patient cooperative and lethargic  Airway & Oxygen Therapy: Patient Spontanous Breathing and Patient connected to nasal cannula oxygen  Post-op Assessment: Report given to RN and Post -op Vital signs reviewed and stable  Post vital signs: Reviewed and stable  Last Vitals:  Filed Vitals:   08/06/15 1334  BP: 92/66  Pulse: 122  Temp: 36.7 C  Resp: 16    Complications: No apparent anesthesia complications

## 2015-08-06 NOTE — Interval H&P Note (Signed)
History and Physical Interval Note:  08/06/2015 6:19 AM  Curtis Frazier  has presented today for surgery, with the diagnosis of afib  The various methods of treatment have been discussed with the patient and family. After consideration of risks, benefits and other options for treatment, the patient has consented to  Procedure(s): CARDIOVERSION (N/A) TRANSESOPHAGEAL ECHOCARDIOGRAM (TEE) (N/A) as a surgical intervention .  The patient's history has been reviewed, patient examined, no change in status, stable for surgery.  I have reviewed the patient's chart and labs.  Questions were answered to the patient's satisfaction.     Sharol Harness , MD

## 2015-08-06 NOTE — H&P (View-Only) (Signed)
DAILY PROGRESS NOTE  Subjective:  No events overnight. Swallow study results noted. Now on Dys1 diet. Remains in a-fib between 100-110.  Objective:  Temp:  [97.5 F (36.4 C)-98.5 F (36.9 C)] 98.2 F (36.8 C) (02/21 0729) Pulse Rate:  [72-137] 100 (02/21 1000) Resp:  [14-25] 19 (02/21 1000) BP: (89-124)/(55-86) 89/62 mmHg (02/21 1000) SpO2:  [91 %-97 %] 94 % (02/21 1000) FiO2 (%):  [45 %-50 %] 45 % (02/21 0747) Weight:  [176 lb 9.4 oz (80.1 kg)] 176 lb 9.4 oz (80.1 kg) (02/21 0435) Weight change: -9 lb 11.2 oz (-4.4 kg)  Intake/Output from previous day: 02/20 0701 - 02/21 0700 In: 1410.6 [P.O.:120; I.V.:350.6; NG/GT:690; IV Piggyback:250] Out: 6789 [Urine:4525]  Intake/Output from this shift: Total I/O In: 730 [P.O.:480; IV Piggyback:250] Out: 200 [Urine:200]  Medications: Current Facility-Administered Medications  Medication Dose Route Frequency Provider Last Rate Last Dose  . acetaminophen (TYLENOL) tablet 650 mg  650 mg Oral Q4H PRN Willia Craze, NP   650 mg at 08/01/15 0145  . acetylcysteine (MUCOMYST) 20 % nebulizer / oral solution 4 mL  4 mL Nebulization TID Verlee Monte, MD   4 mL at 08/03/15 0745  . amiodarone (NEXTERONE PREMIX) 360 MG/200ML (1.8 mg/mL) IV infusion  30 mg/hr Intravenous Continuous Rogelia Mire, NP 16.7 mL/hr at 08/03/15 0827 30 mg/hr at 08/03/15 0827  . antiseptic oral rinse solution (CORINZ)  7 mL Mouth Rinse QID Erick Colace, NP   7 mL at 08/03/15 0448  . bisacodyl (DULCOLAX) suppository 10 mg  10 mg Rectal Daily PRN Katheren Shams, DO      . chlorhexidine gluconate (PERIDEX) 0.12 % solution 15 mL  15 mL Mouth Rinse BID Erick Colace, NP   15 mL at 08/03/15 3810  . digoxin (LANOXIN) 0.25 MG/ML injection 0.125 mg  0.125 mg Intravenous Daily [name], MD   0.125 mg at 08/03/15 1751  . diltiazem (CARDIZEM) tablet 90 mg  90 mg Oral 4 times per day Verlee Monte, MD   90 mg at 08/03/15 0618  . docusate (COLACE) 50 MG/5ML  liquid 100 mg  100 mg Per Tube BID PRN Erick Colace, NP      . feeding supplement (JEVITY 1.2 CAL) liquid 1,000 mL  1,000 mL Per Tube Continuous [name], MD 65 mL/hr at 08/02/15 1008 1,000 mL at 08/02/15 1008  . feeding supplement (PRO-STAT SUGAR FREE 64) liquid 30 mL  30 mL Per Tube Daily [name], MD   30 mL at 08/03/15 0933  . folic acid (FOLVITE) tablet 1 mg  1 mg Per Tube Daily Erick Colace, NP   1 mg at 08/03/15 0936  . food thickener (THICK IT) powder   Oral PRN Verlee Monte, MD      . free water 300 mL  300 mL Per Tube 3 times per day Verlee Monte, MD   300 mL at 08/02/15 2120  . furosemide (LASIX) injection 40 mg  40 mg Intravenous Daily Verlee Monte, MD   40 mg at 08/03/15 0936  . guaiFENesin (ROBITUSSIN) 100 MG/5ML solution 200 mg  10 mL Oral Q4H Mutaz Elmahi, MD   200 mg at 08/03/15 0932  . heparin ADULT infusion 100 units/mL (25000 units/250 mL)  1,550 Units/hr Intravenous Continuous Reginia Naas, RPH 15.5 mL/hr at 08/02/15 2116 1,550 Units/hr at 08/02/15 2116  . hydrocortisone sodium succinate (SOLU-CORTEF) 100 MG injection 50 mg  50 mg Intravenous Q12H Mutaz  Elmahi, MD   50 mg at 08/03/15 0936  . insulin aspart (novoLOG) injection 0-20 Units  0-20 Units Subcutaneous 6 times per day Katheren Shams, DO   4 Units at 08/03/15 0569  . insulin aspart (novoLOG) injection 3 Units  3 Units Subcutaneous 6 times per day [name], MD   3 Units at 08/02/15 1630  . insulin glargine (LANTUS) injection 10 Units  10 Units Subcutaneous Daily Verlee Monte, MD   10 Units at 08/03/15 0932  . levalbuterol (XOPENEX) nebulizer solution 0.63 mg  0.63 mg Nebulization Q6H Modena Jansky, MD   0.63 mg at 08/03/15 0745  . levalbuterol (XOPENEX) nebulizer solution 0.63 mg  0.63 mg Nebulization Q6H PRN Katheren Shams, DO   0.63 mg at 07/27/15 1153  . meropenem (MERREM) 1 g in sodium chloride 0.9 % 100 mL IVPB  1 g Intravenous Q12H Cecilio Asper Batchelder, RPH   1 g at  08/03/15 7948  . metoprolol tartrate (LOPRESSOR) 25 mg/10 mL oral suspension 12.5 mg  12.5 mg Per Tube BID Verlee Monte, MD   12.5 mg at 08/03/15 0933  . ondansetron (ZOFRAN) injection 4 mg  4 mg Intravenous Q6H PRN Ritta Slot, NP      . pantoprazole sodium (PROTONIX) 40 mg/20 mL oral suspension 40 mg  40 mg Per Tube Daily Cassie Jodean Lima, RPH   40 mg at 08/03/15 0934  . polyethylene glycol (MIRALAX / GLYCOLAX) packet 17 g  17 g Per Tube Daily [name], MD   17 g at 08/03/15 [phone]  . sodium chloride flush (NS) 0.9 % injection 10-40 mL  10-40 mL Intracatheter Q12H Orson Eva, MD   10 mL at 08/03/15 1007  . sodium chloride flush (NS) 0.9 % injection 10-40 mL  10-40 mL Intracatheter PRN Orson Eva, MD   10 mL at 07/19/15 0853  . vancomycin (VANCOCIN) IVPB 750 mg/150 ml premix  750 mg Intravenous Q24H Reginia Naas, RPH   750 mg at 08/03/15 5374    Physical Exam: General appearance: alert, moderate distress and pale Neck: no carotid bruit and no JVD Lungs: diminished breath sounds bibasilar Heart: irregularly irregular rhythm Abdomen: soft, non-tender; bowel sounds normal; no masses,  no organomegaly Extremities: edema 1+ edema Pulses: 2+ and symmetric Skin: pale, cool, dry Neurologic: Mental status: Awake, but not talkative, struggles a little to breath Psych: Flat affect  Lab Results: Results for orders placed or performed during the hospital encounter of 07/17/15 (from the past 48 hour(s))  Glucose, capillary     Status: Abnormal   Collection Time: 08/01/15  1:31 PM  Result Value Ref Range   Glucose-Capillary 233 (H) 65 - 99 mg/dL  Glucose, capillary     Status: Abnormal   Collection Time: 08/01/15  4:03 PM  Result Value Ref Range   Glucose-Capillary 209 (H) 65 - 99 mg/dL  Glucose, capillary     Status: Abnormal   Collection Time: 08/01/15  8:38 PM  Result Value Ref Range   Glucose-Capillary 199 (H) 65 - 99 mg/dL  Glucose, capillary     Status: Abnormal    Collection Time: 08/02/15 12:09 AM  Result Value Ref Range   Glucose-Capillary 249 (H) 65 - 99 mg/dL  Heparin level (unfractionated)     Status: None   Collection Time: 08/02/15  2:20 AM  Result Value Ref Range   Heparin Unfractionated 0.50 0.30 - 0.70 IU/mL    Comment:  IF HEPARIN RESULTS ARE BELOW EXPECTED VALUES, AND PATIENT DOSAGE HAS BEEN CONFIRMED, SUGGEST FOLLOW UP TESTING OF ANTITHROMBIN III LEVELS.   CBC     Status: Abnormal   Collection Time: 08/02/15  2:20 AM  Result Value Ref Range   WBC 10.4 4.0 - 10.5 K/uL   RBC 3.38 (L) 4.22 - 5.81 MIL/uL   Hemoglobin 10.0 (L) 13.0 - 17.0 g/dL   HCT 33.4 (L) 39.0 - 52.0 %   MCV 98.8 78.0 - 100.0 fL   MCH 29.6 26.0 - 34.0 pg   MCHC 29.9 (L) 30.0 - 36.0 g/dL   RDW 16.8 (H) 11.5 - 15.5 %   Platelets 295 150 - 400 K/uL  Basic metabolic panel     Status: Abnormal   Collection Time: 08/02/15  2:20 AM  Result Value Ref Range   Sodium 145 135 - 145 mmol/L   Potassium 3.3 (L) 3.5 - 5.1 mmol/L    Comment: DELTA CHECK NOTED   Chloride 100 (L) 101 - 111 mmol/L   CO2 33 (H) 22 - 32 mmol/L   Glucose, Bld 244 (H) 65 - 99 mg/dL   BUN 30 (H) 6 - 20 mg/dL   Creatinine, Ser 1.49 (H) 0.61 - 1.24 mg/dL   Calcium 8.5 (L) 8.9 - 10.3 mg/dL   GFR calc non Af Amer 44 (L) >60 mL/min   GFR calc Af Amer 51 (L) >60 mL/min    Comment: (NOTE) The eGFR has been calculated using the CKD EPI equation. This calculation has not been validated in all clinical situations. eGFR's persistently <60 mL/min signify possible Chronic Kidney Disease.    Anion gap 12 5 - 15  Glucose, capillary     Status: Abnormal   Collection Time: 08/02/15  4:47 AM  Result Value Ref Range   Glucose-Capillary 288 (H) 65 - 99 mg/dL  Glucose, capillary     Status: Abnormal   Collection Time: 08/02/15  7:42 AM  Result Value Ref Range   Glucose-Capillary 172 (H) 65 - 99 mg/dL  Glucose, capillary     Status: Abnormal   Collection Time: 08/02/15 12:47 PM  Result Value Ref  Range   Glucose-Capillary 208 (H) 65 - 99 mg/dL  Glucose, capillary     Status: Abnormal   Collection Time: 08/02/15  4:27 PM  Result Value Ref Range   Glucose-Capillary 292 (H) 65 - 99 mg/dL  Glucose, capillary     Status: Abnormal   Collection Time: 08/02/15  8:44 PM  Result Value Ref Range   Glucose-Capillary 185 (H) 65 - 99 mg/dL  Glucose, capillary     Status: None   Collection Time: 08/03/15 12:46 AM  Result Value Ref Range   Glucose-Capillary 89 65 - 99 mg/dL  Heparin level (unfractionated)     Status: None   Collection Time: 08/03/15  4:40 AM  Result Value Ref Range   Heparin Unfractionated 0.64 0.30 - 0.70 IU/mL    Comment:        IF HEPARIN RESULTS ARE BELOW EXPECTED VALUES, AND PATIENT DOSAGE HAS BEEN CONFIRMED, SUGGEST FOLLOW UP TESTING OF ANTITHROMBIN III LEVELS.   CBC     Status: Abnormal   Collection Time: 08/03/15  4:40 AM  Result Value Ref Range   WBC 10.4 4.0 - 10.5 K/uL   RBC 3.33 (L) 4.22 - 5.81 MIL/uL   Hemoglobin 10.0 (L) 13.0 - 17.0 g/dL   HCT 32.7 (L) 39.0 - 52.0 %   MCV 98.2 78.0 - 100.0 fL  MCH 30.0 26.0 - 34.0 pg   MCHC 30.6 30.0 - 36.0 g/dL   RDW 16.8 (H) 11.5 - 15.5 %   Platelets 301 150 - 400 K/uL  Basic metabolic panel     Status: Abnormal   Collection Time: 08/03/15  4:40 AM  Result Value Ref Range   Sodium 147 (H) 135 - 145 mmol/L   Potassium 3.9 3.5 - 5.1 mmol/L   Chloride 102 101 - 111 mmol/L   CO2 33 (H) 22 - 32 mmol/L   Glucose, Bld 236 (H) 65 - 99 mg/dL   BUN 34 (H) 6 - 20 mg/dL   Creatinine, Ser 1.66 (H) 0.61 - 1.24 mg/dL   Calcium 8.7 (L) 8.9 - 10.3 mg/dL   GFR calc non Af Amer 38 (L) >60 mL/min   GFR calc Af Amer 45 (L) >60 mL/min    Comment: (NOTE) The eGFR has been calculated using the CKD EPI equation. This calculation has not been validated in all clinical situations. eGFR's persistently <60 mL/min signify possible Chronic Kidney Disease.    Anion gap 12 5 - 15  Magnesium     Status: None   Collection Time:  08/03/15  4:40 AM  Result Value Ref Range   Magnesium 2.3 1.7 - 2.4 mg/dL  Glucose, capillary     Status: Abnormal   Collection Time: 08/03/15  4:43 AM  Result Value Ref Range   Glucose-Capillary 199 (H) 65 - 99 mg/dL  Glucose, capillary     Status: Abnormal   Collection Time: 08/03/15  8:04 AM  Result Value Ref Range   Glucose-Capillary 161 (H) 65 - 99 mg/dL  Vancomycin, trough     Status: None   Collection Time: 08/03/15  9:30 AM  Result Value Ref Range   Vancomycin Tr 11 10.0 - 20.0 ug/mL    Imaging: Dg Swallowing Func-speech Pathology  08/02/2015  Objective Swallowing Evaluation: Type of Study: MBS-Modified Barium Swallow Study Patient Details Name: Curtis Frazier MRN: [id number] Date of Birth: 06-07-1941 Today's Date: 08/02/2015 Time: SLP Start Time (ACUTE ONLY): 1316-SLP Stop Time (ACUTE ONLY): 1332 SLP Time Calculation (min) (ACUTE ONLY): 16 min Past Medical History: Past Medical History Diagnosis Date . Other and unspecified hyperlipidemia  . Coronary atherosclerosis of unspecified type of vessel, native or graft  . Personal history of colonic polyps  . Actinic keratosis  . 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 Other psoriasis  . Scoliosis (and kyphoscoliosis), idiopathic  . GI bleed 12/10   cecam AVM . Gastritis 12/10 . Hyperlipidemia    takes Pravastatin daily . Hypertension    takes Metoprolol daily . Myocardial infarction ([place]) 2010 . Shortness of breath    with exertion . Pneumonia    hx of-2008 . Headache(784.0)    occasionally . Joint pain  . Joint swelling  . Rheumatoid arthritis(714.0)    takes Methotrexate 7pills weekly . Degeneration of intervertebral disc, site unspecified  . Chronic back pain    scoliosis/stenosis/radiculopathy,degenerative disc disease . Psoriasis  . Bruises easily  . Esophageal reflux    takes Omeprazole daily . History of colonic polyps  . Hemorrhoids  . Kidney stone    hx of . Blood transfusion 2010 . Type II or unspecified type diabetes mellitus without mention of  complication, not stated as uncontrolled    type II;controlled by diet and exercise . PMR (polymyalgia rheumatica) (HCC) 01/20/2012   tx by specialist on a long prednisone taper . S/P PTCA (percutaneous transluminal coronary angioplasty)  Past Surgical  History: Past Surgical History Procedure Laterality Date . Rotator cuff repair Left 2000 . Colonoscopy   . Vasectomy  1979 . Coronary angioplasty with stent placement  02/2009   has one stent . Carotid doppler  10/12   0-39% R and 60-79% left  . Cataract extraction  2012 . Cardiac catheterization  2010 . Lithotripsy  2009 HPI: 75 y.o. male with h/o HLD, CAD, scoliosis, GI bleed, HTN, myocardial infarction (2010< SOB, PNA, esophageal reflux and type 2 DM with recent laminectomy who presented to ED with c/o confusion and fever. CXR 2/4 revealed LLL atelectasis vs. early infiltrate. Pt intubated 2/8-2/15 (8 days). CXR 2/17 LLL atelectasis or pneumonia. Pt has been seen by ST since extubation and is appropriate for objective assessment. Subjective: The patient was seen with his breakfast tray sitting in his bedside chair.  Assessment / Plan / Recommendation CHL IP CLINICAL IMPRESSIONS 08/02/2015 Therapy Diagnosis Mild pharyngeal phase dysphagia;Moderate pharyngeal phase dysphagia;Mild oral phase dysphagia Clinical Impression Pt exhibited mild oral phase dysphagia and mild-mod pharyngeal phase dysphagia. Delayed oral transit with thicker consistencies and delayed swallow initiation to the vallecula observed. Reduced laryngeal elevation and closure resulted in penetration during the swallow with honey thick liquid, however administration of liquid via teaspoon appeared to prevent further penetration events. Mod-max vallecular residue occured due to decreased base of tongue retraction. SLP provided mod verbal cues to swallow x2 which was effective to decrease amount. Decreased apneic period and increased effort required during mastication results in dyspnea. Pt and daughter  educated re: results of MBSS and diet recommendation and decreased respiratory status leading to higher aspiration risk. Recommend Dysphagia 1 (puree) diet, honey thick liquids, meds crushed in puree, swallow x2 after each bite and full supervision. SLP will f/u to determine safety with recommended diet/liquids. Impact on safety and function Severe aspiration risk   CHL IP TREATMENT RECOMMENDATION 08/02/2015 Treatment Recommendations Therapy as outlined in treatment plan below   Prognosis 08/02/2015 Prognosis for Safe Diet Advancement Good Barriers to Reach Goals Severity of deficits Barriers/Prognosis Comment -- CHL IP DIET RECOMMENDATION 08/02/2015 SLP Diet Recommendations Dysphagia 1 (Puree) solids;Honey thick liquids Liquid Administration via Spoon Medication Administration Crushed with puree Compensations Minimize environmental distractions;Slow rate;Small sips/bites;Multiple dry swallows after each bite/sip Postural Changes Seated upright at 90 degrees   CHL IP OTHER RECOMMENDATIONS 08/02/2015 Recommended Consults -- Oral Care Recommendations Oral care BID Other Recommendations Have oral suction available;Order thickener from pharmacy;Prohibited food (jello, ice cream, thin soups)   CHL IP FOLLOW UP RECOMMENDATIONS 08/02/2015 Follow up Recommendations Inpatient Rehab   CHL IP FREQUENCY AND DURATION 08/02/2015 Speech Therapy Frequency (ACUTE ONLY) min 2x/week Treatment Duration 2 weeks      CHL IP ORAL PHASE 08/02/2015 Oral Phase Impaired Oral - Pudding Teaspoon -- Oral - Pudding Cup -- Oral - Honey Teaspoon Delayed oral transit Oral - Honey Cup Delayed oral transit Oral - Nectar Teaspoon WFL Oral - Nectar Cup -- Oral - Nectar Straw -- Oral - Thin Teaspoon -- Oral - Thin Cup -- Oral - Thin Straw -- Oral - Puree Delayed oral transit Oral - Mech Soft Delayed oral transit Oral - Regular -- Oral - Multi-Consistency -- Oral - Pill -- Oral Phase - Comment --  CHL IP PHARYNGEAL PHASE 08/02/2015 Pharyngeal Phase Impaired  Pharyngeal- Pudding Teaspoon -- Pharyngeal -- Pharyngeal- Pudding Cup -- Pharyngeal -- Pharyngeal- Honey Teaspoon Penetration/Aspiration during swallow;Delayed swallow initiation-vallecula;Pharyngeal residue - valleculae;Reduced airway/laryngeal closure;Reduced tongue base retraction Pharyngeal Material enters airway, remains ABOVE vocal cords then ejected   out Pharyngeal- Honey Cup Delayed swallow initiation-vallecula;Pharyngeal residue - valleculae;Penetration/Aspiration during swallow;Reduced laryngeal elevation;Reduced airway/laryngeal closure;Reduced epiglottic inversion;Reduced tongue base retraction Pharyngeal Material enters airway, CONTACTS cords and not ejected out Pharyngeal- Nectar Teaspoon Delayed swallow initiation-pyriform sinuses;Pharyngeal residue - valleculae Pharyngeal -- Pharyngeal- Nectar Cup -- Pharyngeal -- Pharyngeal- Nectar Straw -- Pharyngeal -- Pharyngeal- Thin Teaspoon -- Pharyngeal -- Pharyngeal- Thin Cup -- Pharyngeal -- Pharyngeal- Thin Straw -- Pharyngeal -- Pharyngeal- Puree Delayed swallow initiation-vallecula;Pharyngeal residue - valleculae Pharyngeal -- Pharyngeal- Mechanical Soft Pharyngeal residue - valleculae;Delayed swallow initiation-vallecula Pharyngeal -- Pharyngeal- Regular -- Pharyngeal -- Pharyngeal- Multi-consistency -- Pharyngeal -- Pharyngeal- Pill -- Pharyngeal -- Pharyngeal Comment --  CHL IP CERVICAL ESOPHAGEAL PHASE 08/02/2015 Cervical Esophageal Phase WFL Pudding Teaspoon -- Pudding Cup -- Honey Teaspoon -- Honey Cup -- Nectar Teaspoon -- Nectar Cup -- Nectar Straw -- Thin Teaspoon -- Thin Cup -- Thin Straw -- Puree -- Mechanical Soft -- Regular -- Multi-consistency -- Pill -- Cervical Esophageal Comment -- CHL IP GO 07/18/2015 Functional Assessment Tool Used ASHA NOMS and clinical judgment.   Functional Limitations Swallowing Swallow Current Status [phone]) CI Swallow Goal Status (H0865) CI Swallow Discharge Status [phone]) (None) Motor Speech Current Status [phone])  (None) Motor Speech Goal Status [phone]) (None) Motor Speech Goal Status [phone]) (None) Spoken Language Comprehension Current Status [phone]) (None) Spoken Language Comprehension Goal Status (G6440) (None) Spoken Language Comprehension Discharge Status [phone]) (None) Spoken Language Expression Current Status [phone]) (None) Spoken Language Expression Goal Status [phone]) (None) Spoken Language Expression Discharge Status [phone]) (None) Attention Current Status (J8841) (None) Attention Goal Status (Y6063) (None) Attention Discharge Status [phone]) (None) Memory Current Status (U9323) (None) Memory Goal Status (F5732) (None) Memory Discharge Status (K0254) (None) Voice Current Status (Y7062) (None) Voice Goal Status (B7628) (None) Voice Discharge Status (B1517) (None) Other Speech-Language Pathology Functional Limitation [phone]) (None) Other Speech-Language Pathology Functional Limitation Goal Status (P7106) (None) Other Speech-Language Pathology Functional Limitation Discharge Status [phone]) (None) [place] Siren 08/02/2015, 2:36 PM Orbie Pyo Colvin Caroli.Ed CCC-SLP Pager [phone]               Assessment:  Principal Problem:   Sepsis ([place]) Active Problems:   Essential hypertension, benign   Coronary atherosclerosis   Rheumatoid arthritis ([place])   Acute encephalopathy   LLL pneumonia   Type II or unspecified type diabetes mellitus without mention of complication, not stated as uncontrolled   S/P lumbar laminectomy   Elevated troponin   Acute respiratory failure with hypoxia (HCC)   Acute pulmonary edema (HCC)   HCAP (healthcare-associated pneumonia)   Palliative care encounter   DNR (do not resuscitate) discussion   Acute respiratory failure ([place])   Endotracheally intubated   CHF (congestive heart failure) ([place])   Change in mental state   Advanced care planning/counseling discussion   Pneumonia   PAF (paroxysmal atrial fibrillation) ([place])   Counseling regarding advanced care planning and  goals of care   Disorientation   Slow transit constipation   Back pain without radiation   Dyspnea   Plan:  Remains in a-fib on IV amiodarone. Now able to take po.  Change amiodarone to 400 mg po daily. Continue short acting diltiazem more reliable absorption while inpatient, can change to LA prior to d/c. Change metoprolol liquid to po lopressor po 12.5 mg BID. Start warfarin per pharmacy - keep on IV heparin until therapeutic INR between 2-3. Will try to arrange for TEE/Cardioversion later this week - looks like there is availability on Friday with Dr. Oval Linsey. Will  tentatively schedule for Friday.  Time Spent Directly with Patient:  15 minutes  Length of Stay:  LOS: 17 days   Pixie Casino, MD, [facility] Attending Cardiologist [place] 08/03/2015, 11:37 AM

## 2015-08-06 NOTE — Progress Notes (Deleted)
Thanks .Marland Kitchen That's not good news.  He has been on amiodarone already for 1 week.  Appreciate you trying.  -Mali

## 2015-08-06 NOTE — CV Procedure (Addendum)
Brief TEE Note   LVEF >55% Moderate calcification of the aortic valve. Mild MR. No LA or LAA thrombus.  For further details see full report.   Electrical Cardioversion Procedure Note APRIL TOMAC [id number] 1940-11-12  Procedure: Electrical Cardioversion Indications:  Atrial Fibrillation  Procedure Details Consent: Risks of procedure as well as the alternatives and risks of each were explained to the (patient/caregiver).  Consent for procedure obtained. Time Out: Verified patient identification, verified procedure, site/side was marked, verified correct patient position, special equipment/implants available, medications/allergies/relevent history reviewed, required imaging and test results available.  Performed  Patient placed on cardiac monitor, pulse oximetry, supplemental oxygen as necessary.  Sedation given: Propofol Pacer pads placed anterior and posterior chest.  Cardioverted 2 time(s).  Cardioverted at 150J and 200J, both successful momentarily.  However, he quickly reverted back to atrial fibrillation.  Evaluation Findings: Post procedure EKG shows: Atrial Fibrillation Complications: None Patient did tolerate procedure well.   Kyllian Clingerman C. Oval Linsey, MD, [facility]  08/06/2015  3:03 PM

## 2015-08-06 NOTE — Anesthesia Postprocedure Evaluation (Signed)
Anesthesia Post Note  Patient: Curtis Frazier  Procedure(s) Performed: Procedure(s) (LRB): TRANSESOPHAGEAL ECHOCARDIOGRAM (TEE) (N/A) CARDIOVERSION (N/A)  Patient location during evaluation: PACU Anesthesia Type: MAC Level of consciousness: awake and alert Pain management: pain level controlled Vital Signs Assessment: post-procedure vital signs reviewed and stable Respiratory status: spontaneous breathing, nonlabored ventilation, respiratory function stable and patient connected to nasal cannula oxygen Cardiovascular status: stable and blood pressure returned to baseline Anesthetic complications: no    Last Vitals:  Filed Vitals:   08/06/15 1334 08/06/15 1514  BP: 92/66 123/80  Pulse: 122 77  Temp: 36.7 C   Resp: 16 15    Last Pain: There were no vitals filed for this visit.               Effie Berkshire

## 2015-08-06 NOTE — Progress Notes (Signed)
  Echocardiogram Echocardiogram Transesophageal has been performed.  Bobbye Charleston 08/06/2015, 3:06 PM

## 2015-08-06 NOTE — Anesthesia Preprocedure Evaluation (Addendum)
Anesthesia Evaluation  Patient identified by MRN, date of birth, ID band Patient awake    Reviewed: Allergy & Precautions, NPO status , Patient's Chart, lab work & pertinent test results  Airway Mallampati: II  TM Distance: >3 FB Neck ROM: Full    Dental  (+) Partial Upper   Pulmonary shortness of breath, former smoker,     + wheezing      Cardiovascular hypertension, + CAD, + Past MI and +CHF  + Valvular Problems/Murmurs  Rhythm:Irregular Rate:Abnormal     Neuro/Psych  Headaches, PSYCHIATRIC DISORDERS    GI/Hepatic Neg liver ROS, GERD  Medicated,  Endo/Other  diabetes, Type 1, Insulin Dependent  Renal/GU Renal disease  negative genitourinary   Musculoskeletal  (+) Arthritis ,   Abdominal   Peds negative pediatric ROS (+)  Hematology   Anesthesia Other Findings   Reproductive/Obstetrics negative OB ROS                           Lab Results  Component Value Date   WBC 12.5* 08/06/2015   HGB 10.8* 08/06/2015   HCT 35.1* 08/06/2015   MCV 100.9* 08/06/2015   PLT 209 08/06/2015   Lab Results  Component Value Date   CREATININE 1.58* 08/06/2015   BUN 29* 08/06/2015   NA 149* 08/06/2015   K 3.5 08/06/2015   CL 110 08/06/2015   CO2 29 08/06/2015   Lab Results  Component Value Date   INR 2.40* 08/06/2015   INR 1.61* 08/05/2015   INR 1.22 08/04/2015   07/2015 Echo - Left ventricle: The cavity size was normal. Wall thickness was normal. Systolic function was vigorous. The estimated ejection fraction was in the range of 65% to 70%. Indeterminant diastolic function. Although no diagnostic regional wall motion abnormality was identified, this possibility cannot be completely excluded on the basis of this study. - Aortic valve: Moderately calcified leaflets. There was no stenosis. - Mitral valve: Mildly calcified annulus. There was trivial regurgitation. - Right ventricle: The cavity  size was normal. Systolic function was normal. - Tricuspid valve: Peak RV-RA gradient (S): 29 mm Hg. - Pulmonary arteries: PA peak pressure: 32 mm Hg (S). - Inferior vena cava: The vessel was normal in size. The respirophasic diameter changes were in the normal range (>= 50%), consistent with normal central venous pressure.  07/21/2015 EKG: Afib with RVR  Anesthesia Physical Anesthesia Plan  ASA: IV  Anesthesia Plan: MAC   Post-op Pain Management:    Induction: Intravenous  Airway Management Planned: Natural Airway  Additional Equipment:   Intra-op Plan:   Post-operative Plan:   Informed Consent: I have reviewed the patients History and Physical, chart, labs and discussed the procedure including the risks, benefits and alternatives for the proposed anesthesia with the patient or authorized representative who has indicated his/her understanding and acceptance.   Dental advisory given  Plan Discussed with: CRNA  Anesthesia Plan Comments: (Will convert to [place] if patient does not tolerate passage of TEE probe. )       Anesthesia Quick Evaluation

## 2015-08-07 LAB — BASIC METABOLIC PANEL
Anion gap: 11 (ref 5–15)
BUN: 27 mg/dL — ABNORMAL HIGH (ref 6–20)
CALCIUM: 8.8 mg/dL — AB (ref 8.9–10.3)
CO2: 28 mmol/L (ref 22–32)
CREATININE: 1.47 mg/dL — AB (ref 0.61–1.24)
Chloride: 113 mmol/L — ABNORMAL HIGH (ref 101–111)
GFR, EST AFRICAN AMERICAN: 52 mL/min — AB (ref >60)
GFR, EST NON AFRICAN AMERICAN: 45 mL/min — AB (ref >60)
Glucose, Bld: 201 mg/dL — ABNORMAL HIGH (ref 65–99)
Potassium: 3.4 mmol/L — ABNORMAL LOW (ref 3.5–5.1)
SODIUM: 152 mmol/L — AB (ref 135–145)

## 2015-08-07 LAB — CBC WITH DIFFERENTIAL/PLATELET
BASOS PCT: 0 %
Basophils Absolute: 0 10*3/uL (ref 0.0–0.1)
EOS ABS: 0.2 10*3/uL (ref 0.0–0.7)
Eosinophils Relative: 2 %
HCT: 35.6 % — ABNORMAL LOW (ref 39.0–52.0)
HEMOGLOBIN: 10.6 g/dL — AB (ref 13.0–17.0)
Lymphocytes Relative: 23 %
Lymphs Abs: 2.2 10*3/uL (ref 0.7–4.0)
MCH: 29.7 pg (ref 26.0–34.0)
MCHC: 29.8 g/dL — AB (ref 30.0–36.0)
MCV: 99.7 fL (ref 78.0–100.0)
MONO ABS: 0.7 10*3/uL (ref 0.1–1.0)
MONOS PCT: 7 %
NEUTROS PCT: 68 %
Neutro Abs: 6.5 10*3/uL (ref 1.7–7.7)
Platelets: 198 10*3/uL (ref 150–400)
RBC: 3.57 MIL/uL — ABNORMAL LOW (ref 4.22–5.81)
RDW: 16.7 % — AB (ref 11.5–15.5)
WBC: 9.6 10*3/uL (ref 4.0–10.5)

## 2015-08-07 LAB — PROTIME-INR
INR: 2.5 — ABNORMAL HIGH (ref 0.00–1.49)
PROTHROMBIN TIME: 26.7 s — AB (ref 11.6–15.2)

## 2015-08-07 LAB — PHOSPHORUS: PHOSPHORUS: 2.9 mg/dL (ref 2.5–4.6)

## 2015-08-07 LAB — MAGNESIUM: MAGNESIUM: 1.9 mg/dL (ref 1.7–2.4)

## 2015-08-08 LAB — BASIC METABOLIC PANEL
ANION GAP: 11 (ref 5–15)
BUN: 32 mg/dL — ABNORMAL HIGH (ref 6–20)
CHLORIDE: 117 mmol/L — AB (ref 101–111)
CO2: 28 mmol/L (ref 22–32)
Calcium: 9.1 mg/dL (ref 8.9–10.3)
Creatinine, Ser: 1.66 mg/dL — ABNORMAL HIGH (ref 0.61–1.24)
GFR calc Af Amer: 45 mL/min — ABNORMAL LOW (ref >60)
GFR, EST NON AFRICAN AMERICAN: 38 mL/min — AB (ref >60)
GLUCOSE: 183 mg/dL — AB (ref 65–99)
POTASSIUM: 3.6 mmol/L (ref 3.5–5.1)
Sodium: 156 mmol/L — ABNORMAL HIGH (ref 135–145)

## 2015-08-08 LAB — PROTIME-INR
INR: 2.76 — AB (ref 0.00–1.49)
Prothrombin Time: 28.8 seconds — ABNORMAL HIGH (ref 11.6–15.2)

## 2015-08-09 ENCOUNTER — Encounter (HOSPITAL_COMMUNITY): Payer: Self-pay | Admitting: Cardiovascular Disease

## 2015-08-09 LAB — RENAL FUNCTION PANEL
ALBUMIN: 2.4 g/dL — AB (ref 3.5–5.0)
ANION GAP: 10 (ref 5–15)
BUN: 34 mg/dL — ABNORMAL HIGH (ref 6–20)
CALCIUM: 8.7 mg/dL — AB (ref 8.9–10.3)
CO2: 27 mmol/L (ref 22–32)
CREATININE: 1.8 mg/dL — AB (ref 0.61–1.24)
Chloride: 114 mmol/L — ABNORMAL HIGH (ref 101–111)
GFR, EST AFRICAN AMERICAN: 40 mL/min — AB (ref >60)
GFR, EST NON AFRICAN AMERICAN: 35 mL/min — AB (ref >60)
Glucose, Bld: 195 mg/dL — ABNORMAL HIGH (ref 65–99)
PHOSPHORUS: 3.8 mg/dL (ref 2.5–4.6)
Potassium: 3.6 mmol/L (ref 3.5–5.1)
SODIUM: 151 mmol/L — AB (ref 135–145)

## 2015-08-09 LAB — CBC WITH DIFFERENTIAL/PLATELET
BASOS ABS: 0 10*3/uL (ref 0.0–0.1)
Basophils Relative: 0 %
EOS ABS: 0.3 10*3/uL (ref 0.0–0.7)
EOS PCT: 2 %
HCT: 36.4 % — ABNORMAL LOW (ref 39.0–52.0)
HEMOGLOBIN: 10.9 g/dL — AB (ref 13.0–17.0)
LYMPHS ABS: 2.4 10*3/uL (ref 0.7–4.0)
Lymphocytes Relative: 23 %
MCH: 30.1 pg (ref 26.0–34.0)
MCHC: 29.9 g/dL — ABNORMAL LOW (ref 30.0–36.0)
MCV: 100.6 fL — ABNORMAL HIGH (ref 78.0–100.0)
Monocytes Absolute: 0.6 10*3/uL (ref 0.1–1.0)
Monocytes Relative: 5 %
NEUTROS PCT: 70 %
Neutro Abs: 7.4 10*3/uL (ref 1.7–7.7)
PLATELETS: 202 10*3/uL (ref 150–400)
RBC: 3.62 MIL/uL — AB (ref 4.22–5.81)
RDW: 16.6 % — ABNORMAL HIGH (ref 11.5–15.5)
WBC: 10.7 10*3/uL — AB (ref 4.0–10.5)

## 2015-08-09 LAB — BRAIN NATRIURETIC PEPTIDE: B NATRIURETIC PEPTIDE 5: 357.5 pg/mL — AB (ref 0.0–100.0)

## 2015-08-09 LAB — PROTIME-INR
INR: 2.89 — AB (ref 0.00–1.49)
Prothrombin Time: 29.8 seconds — ABNORMAL HIGH (ref 11.6–15.2)

## 2015-08-09 LAB — MAGNESIUM: MAGNESIUM: 1.8 mg/dL (ref 1.7–2.4)

## 2015-08-09 LAB — DIGOXIN LEVEL: DIGOXIN LVL: 1.1 ng/mL (ref 0.8–2.0)

## 2015-08-10 ENCOUNTER — Inpatient Hospital Stay (HOSPITAL_COMMUNITY): Payer: Medicare Other

## 2015-08-10 ENCOUNTER — Encounter (HOSPITAL_COMMUNITY): Payer: Self-pay | Admitting: Anesthesiology

## 2015-08-10 ENCOUNTER — Inpatient Hospital Stay (HOSPITAL_COMMUNITY)
Admission: AD | Admit: 2015-08-10 | Discharge: 2015-08-17 | DRG: 640 | Disposition: A | Discharge status: Rehabilitation Facility | Payer: Medicare Other | Source: Ambulatory Visit | Attending: Internal Medicine | Admitting: Internal Medicine

## 2015-08-10 DIAGNOSIS — M4120 Other idiopathic scoliosis, site unspecified: Secondary | ICD-10-CM | POA: Diagnosis present

## 2015-08-10 DIAGNOSIS — I480 Paroxysmal atrial fibrillation: Secondary | ICD-10-CM | POA: Diagnosis present

## 2015-08-10 DIAGNOSIS — R5381 Other malaise: Secondary | ICD-10-CM | POA: Diagnosis not present

## 2015-08-10 DIAGNOSIS — E785 Hyperlipidemia, unspecified: Secondary | ICD-10-CM | POA: Diagnosis present

## 2015-08-10 DIAGNOSIS — Y838 Other surgical procedures as the cause of abnormal reaction of the patient, or of later complication, without mention of misadventure at the time of the procedure: Secondary | ICD-10-CM | POA: Diagnosis present

## 2015-08-10 DIAGNOSIS — E1022 Type 1 diabetes mellitus with diabetic chronic kidney disease: Secondary | ICD-10-CM | POA: Diagnosis not present

## 2015-08-10 DIAGNOSIS — Z7901 Long term (current) use of anticoagulants: Secondary | ICD-10-CM | POA: Diagnosis not present

## 2015-08-10 DIAGNOSIS — K219 Gastro-esophageal reflux disease without esophagitis: Secondary | ICD-10-CM

## 2015-08-10 DIAGNOSIS — R0602 Shortness of breath: Secondary | ICD-10-CM

## 2015-08-10 DIAGNOSIS — I252 Old myocardial infarction: Secondary | ICD-10-CM | POA: Diagnosis not present

## 2015-08-10 DIAGNOSIS — N179 Acute kidney failure, unspecified: Secondary | ICD-10-CM | POA: Diagnosis present

## 2015-08-10 DIAGNOSIS — I959 Hypotension, unspecified: Secondary | ICD-10-CM | POA: Diagnosis not present

## 2015-08-10 DIAGNOSIS — I5032 Chronic diastolic (congestive) heart failure: Secondary | ICD-10-CM | POA: Diagnosis not present

## 2015-08-10 DIAGNOSIS — R1310 Dysphagia, unspecified: Secondary | ICD-10-CM | POA: Diagnosis not present

## 2015-08-10 DIAGNOSIS — Z7952 Long term (current) use of systemic steroids: Secondary | ICD-10-CM | POA: Diagnosis not present

## 2015-08-10 DIAGNOSIS — I130 Hypertensive heart and chronic kidney disease with heart failure and stage 1 through stage 4 chronic kidney disease, or unspecified chronic kidney disease: Secondary | ICD-10-CM | POA: Diagnosis present

## 2015-08-10 DIAGNOSIS — B370 Candidal stomatitis: Secondary | ICD-10-CM | POA: Diagnosis present

## 2015-08-10 DIAGNOSIS — Z794 Long term (current) use of insulin: Secondary | ICD-10-CM

## 2015-08-10 DIAGNOSIS — R4701 Aphasia: Secondary | ICD-10-CM | POA: Diagnosis present

## 2015-08-10 DIAGNOSIS — R1313 Dysphagia, pharyngeal phase: Secondary | ICD-10-CM | POA: Diagnosis present

## 2015-08-10 DIAGNOSIS — E272 Addisonian crisis: Secondary | ICD-10-CM | POA: Diagnosis present

## 2015-08-10 DIAGNOSIS — E119 Type 2 diabetes mellitus without complications: Secondary | ICD-10-CM

## 2015-08-10 DIAGNOSIS — M353 Polymyalgia rheumatica: Secondary | ICD-10-CM | POA: Diagnosis present

## 2015-08-10 DIAGNOSIS — R0600 Dyspnea, unspecified: Secondary | ICD-10-CM

## 2015-08-10 DIAGNOSIS — Z955 Presence of coronary angioplasty implant and graft: Secondary | ICD-10-CM | POA: Diagnosis not present

## 2015-08-10 DIAGNOSIS — R339 Retention of urine, unspecified: Secondary | ICD-10-CM | POA: Diagnosis not present

## 2015-08-10 DIAGNOSIS — R7309 Other abnormal glucose: Secondary | ICD-10-CM | POA: Diagnosis not present

## 2015-08-10 DIAGNOSIS — J181 Lobar pneumonia, unspecified organism: Secondary | ICD-10-CM

## 2015-08-10 DIAGNOSIS — D649 Anemia, unspecified: Secondary | ICD-10-CM | POA: Diagnosis not present

## 2015-08-10 DIAGNOSIS — Z8619 Personal history of other infectious and parasitic diseases: Secondary | ICD-10-CM

## 2015-08-10 DIAGNOSIS — I2510 Atherosclerotic heart disease of native coronary artery without angina pectoris: Secondary | ICD-10-CM | POA: Diagnosis present

## 2015-08-10 DIAGNOSIS — N183 Chronic kidney disease, stage 3 (moderate): Secondary | ICD-10-CM | POA: Diagnosis present

## 2015-08-10 DIAGNOSIS — T8131XA Disruption of external operation (surgical) wound, not elsewhere classified, initial encounter: Secondary | ICD-10-CM | POA: Diagnosis present

## 2015-08-10 DIAGNOSIS — N189 Chronic kidney disease, unspecified: Secondary | ICD-10-CM

## 2015-08-10 DIAGNOSIS — Z87891 Personal history of nicotine dependence: Secondary | ICD-10-CM | POA: Diagnosis not present

## 2015-08-10 DIAGNOSIS — L4050 Arthropathic psoriasis, unspecified: Secondary | ICD-10-CM | POA: Diagnosis present

## 2015-08-10 DIAGNOSIS — G9341 Metabolic encephalopathy: Secondary | ICD-10-CM | POA: Diagnosis present

## 2015-08-10 DIAGNOSIS — Z87442 Personal history of urinary calculi: Secondary | ICD-10-CM

## 2015-08-10 DIAGNOSIS — R278 Other lack of coordination: Secondary | ICD-10-CM | POA: Diagnosis present

## 2015-08-10 DIAGNOSIS — M069 Rheumatoid arthritis, unspecified: Secondary | ICD-10-CM | POA: Diagnosis present

## 2015-08-10 DIAGNOSIS — D638 Anemia in other chronic diseases classified elsewhere: Secondary | ICD-10-CM | POA: Diagnosis present

## 2015-08-10 DIAGNOSIS — G9340 Encephalopathy, unspecified: Secondary | ICD-10-CM | POA: Diagnosis present

## 2015-08-10 DIAGNOSIS — E870 Hyperosmolality and hypernatremia: Secondary | ICD-10-CM | POA: Diagnosis present

## 2015-08-10 DIAGNOSIS — I10 Essential (primary) hypertension: Secondary | ICD-10-CM | POA: Diagnosis not present

## 2015-08-10 DIAGNOSIS — E1122 Type 2 diabetes mellitus with diabetic chronic kidney disease: Secondary | ICD-10-CM | POA: Diagnosis present

## 2015-08-10 DIAGNOSIS — M47816 Spondylosis without myelopathy or radiculopathy, lumbar region: Secondary | ICD-10-CM | POA: Diagnosis not present

## 2015-08-10 DIAGNOSIS — T8130XA Disruption of wound, unspecified, initial encounter: Secondary | ICD-10-CM | POA: Diagnosis present

## 2015-08-10 DIAGNOSIS — R05 Cough: Secondary | ICD-10-CM | POA: Diagnosis not present

## 2015-08-10 DIAGNOSIS — Z79899 Other long term (current) drug therapy: Secondary | ICD-10-CM | POA: Diagnosis not present

## 2015-08-10 DIAGNOSIS — R791 Abnormal coagulation profile: Secondary | ICD-10-CM | POA: Diagnosis not present

## 2015-08-10 DIAGNOSIS — M25551 Pain in right hip: Secondary | ICD-10-CM | POA: Diagnosis not present

## 2015-08-10 DIAGNOSIS — E860 Dehydration: Secondary | ICD-10-CM | POA: Diagnosis present

## 2015-08-10 DIAGNOSIS — T451X1A Poisoning by antineoplastic and immunosuppressive drugs, accidental (unintentional), initial encounter: Secondary | ICD-10-CM

## 2015-08-10 DIAGNOSIS — J189 Pneumonia, unspecified organism: Secondary | ICD-10-CM | POA: Diagnosis present

## 2015-08-10 HISTORY — DX: Unspecified malignant neoplasm of skin, unspecified: C44.90

## 2015-08-10 LAB — GLUCOSE, CAPILLARY
GLUCOSE-CAPILLARY: 221 mg/dL — AB (ref 65–99)
Glucose-Capillary: 224 mg/dL — ABNORMAL HIGH (ref 65–99)

## 2015-08-10 LAB — COMPREHENSIVE METABOLIC PANEL
ALK PHOS: 84 U/L (ref 38–126)
ALT: 25 U/L (ref 17–63)
ANION GAP: 10 (ref 5–15)
AST: 21 U/L (ref 15–41)
Albumin: 2.4 g/dL — ABNORMAL LOW (ref 3.5–5.0)
BUN: 30 mg/dL — ABNORMAL HIGH (ref 6–20)
CALCIUM: 8.7 mg/dL — AB (ref 8.9–10.3)
CHLORIDE: 113 mmol/L — AB (ref 101–111)
CO2: 26 mmol/L (ref 22–32)
Creatinine, Ser: 1.84 mg/dL — ABNORMAL HIGH (ref 0.61–1.24)
GFR calc non Af Amer: 34 mL/min — ABNORMAL LOW (ref >60)
GFR, EST AFRICAN AMERICAN: 39 mL/min — AB (ref >60)
Glucose, Bld: 239 mg/dL — ABNORMAL HIGH (ref 65–99)
Potassium: 4.9 mmol/L (ref 3.5–5.1)
SODIUM: 149 mmol/L — AB (ref 135–145)
Total Bilirubin: 1.3 mg/dL — ABNORMAL HIGH (ref 0.3–1.2)
Total Protein: 6.4 g/dL — ABNORMAL LOW (ref 6.5–8.1)

## 2015-08-10 LAB — CBC WITH DIFFERENTIAL/PLATELET
Basophils Absolute: 0 10*3/uL (ref 0.0–0.1)
Basophils Relative: 0 %
EOS ABS: 0 10*3/uL (ref 0.0–0.7)
EOS PCT: 0 %
HCT: 37.5 % — ABNORMAL LOW (ref 39.0–52.0)
Hemoglobin: 11.3 g/dL — ABNORMAL LOW (ref 13.0–17.0)
Lymphocytes Relative: 14 %
Lymphs Abs: 1.1 10*3/uL (ref 0.7–4.0)
MCH: 30.5 pg (ref 26.0–34.0)
MCHC: 30.1 g/dL (ref 30.0–36.0)
MCV: 101.1 fL — AB (ref 78.0–100.0)
MONOS PCT: 3 %
Monocytes Absolute: 0.2 10*3/uL (ref 0.1–1.0)
NEUTROS ABS: 6.6 10*3/uL (ref 1.7–7.7)
NEUTROS PCT: 83 %
PLATELETS: 202 10*3/uL (ref 150–400)
RBC: 3.71 MIL/uL — ABNORMAL LOW (ref 4.22–5.81)
RDW: 16.5 % — ABNORMAL HIGH (ref 11.5–15.5)
WBC: 8 10*3/uL (ref 4.0–10.5)

## 2015-08-10 LAB — PROTIME-INR
INR: 2.94 — AB (ref 0.00–1.49)
Prothrombin Time: 30.2 seconds — ABNORMAL HIGH (ref 11.6–15.2)

## 2015-08-10 LAB — BLOOD GAS, ARTERIAL
Acid-Base Excess: 1.5 mmol/L (ref 0.0–2.0)
BICARBONATE: 25.3 meq/L — AB (ref 20.0–24.0)
Drawn by: 313941
O2 CONTENT: 4 L/min
O2 Saturation: 94.2 %
PCO2 ART: 37.4 mmHg (ref 35.0–45.0)
PH ART: 7.443 (ref 7.350–7.450)
Patient temperature: 98
TCO2: 26.4 mmol/L (ref 0–100)
pO2, Arterial: 74.3 mmHg — ABNORMAL LOW (ref 80.0–100.0)

## 2015-08-10 LAB — VITAMIN B12: VITAMIN B 12: 4196 pg/mL — AB (ref 180–914)

## 2015-08-10 LAB — AMMONIA: Ammonia: 15 umol/L (ref 9–35)

## 2015-08-10 LAB — LACTIC ACID, PLASMA: Lactic Acid, Venous: 1.3 mmol/L (ref 0.5–2.0)

## 2015-08-10 MED ORDER — INFLIXIMAB 100 MG IV SOLR: 100.0000 mg | INTRAVENOUS | Status: DC

## 2015-08-10 MED ORDER — ALTEPLASE 2 MG IJ SOLR
2.0000 mg | Freq: Once | INTRAMUSCULAR | Status: AC
  Administered 2015-08-10: 2 mg
  Filled 2015-08-10: qty 2

## 2015-08-10 MED ORDER — ALBUTEROL SULFATE (2.5 MG/3ML) 0.083% IN NEBU
3.0000 mL | INHALATION_SOLUTION | Freq: Three times a day (TID) | RESPIRATORY_TRACT | Status: DC
  Administered 2015-08-11 – 2015-08-12 (×5): 3 mL via RESPIRATORY_TRACT
  Filled 2015-08-10 (×5): qty 3

## 2015-08-10 MED ORDER — ACETAMINOPHEN 325 MG PO TABS: 650.0000 mg | ORAL_TABLET | Freq: Four times a day (QID) | ORAL | Status: DC | PRN

## 2015-08-10 MED ORDER — INSULIN ASPART 100 UNIT/ML ~~LOC~~ SOLN
0.0000 [IU] | Freq: Every day | SUBCUTANEOUS | Status: DC
  Administered 2015-08-10: 2 [IU] via SUBCUTANEOUS
  Administered 2015-08-11 – 2015-08-13 (×2): 3 [IU] via SUBCUTANEOUS
  Administered 2015-08-14: 4 [IU] via SUBCUTANEOUS
  Administered 2015-08-15: 2 [IU] via SUBCUTANEOUS

## 2015-08-10 MED ORDER — ENOXAPARIN SODIUM 40 MG/0.4ML ~~LOC~~ SOLN
40.0000 mg | SUBCUTANEOUS | Status: DC
Start: 2015-08-10 — End: 2015-08-10

## 2015-08-10 MED ORDER — FUROSEMIDE 10 MG/ML IJ SOLN
40.0000 mg | Freq: Every day | INTRAMUSCULAR | Status: DC
  Administered 2015-08-10 – 2015-08-11 (×2): 40 mg via INTRAVENOUS
  Filled 2015-08-10 (×2): qty 4

## 2015-08-10 MED ORDER — ACETAMINOPHEN 650 MG RE SUPP: 650.0000 mg | Freq: Four times a day (QID) | RECTAL | Status: DC | PRN

## 2015-08-10 MED ORDER — ALBUTEROL SULFATE (2.5 MG/3ML) 0.083% IN NEBU
3.0000 mL | INHALATION_SOLUTION | Freq: Four times a day (QID) | RESPIRATORY_TRACT | Status: DC
  Administered 2015-08-10: 3 mL via RESPIRATORY_TRACT
  Filled 2015-08-10: qty 3

## 2015-08-10 MED ORDER — AMIODARONE HCL 200 MG PO TABS
400.0000 mg | ORAL_TABLET | Freq: Every day | ORAL | Status: DC
  Administered 2015-08-12 – 2015-08-16 (×5): 400 mg via ORAL
  Filled 2015-08-10 (×6): qty 2

## 2015-08-10 MED ORDER — ONDANSETRON HCL 4 MG PO TABS: 4.0000 mg | ORAL_TABLET | Freq: Four times a day (QID) | ORAL | Status: DC | PRN

## 2015-08-10 MED ORDER — INSULIN ASPART 100 UNIT/ML ~~LOC~~ SOLN
0.0000 [IU] | Freq: Three times a day (TID) | SUBCUTANEOUS | Status: DC
  Administered 2015-08-11: 5 [IU] via SUBCUTANEOUS
  Administered 2015-08-11: 2 [IU] via SUBCUTANEOUS
  Administered 2015-08-11: 8 [IU] via SUBCUTANEOUS
  Administered 2015-08-12: 15 [IU] via SUBCUTANEOUS
  Administered 2015-08-12: 8 [IU] via SUBCUTANEOUS
  Administered 2015-08-12: 11 [IU] via SUBCUTANEOUS
  Administered 2015-08-13 (×2): 5 [IU] via SUBCUTANEOUS
  Administered 2015-08-13: 8 [IU] via SUBCUTANEOUS
  Administered 2015-08-14 (×2): 3 [IU] via SUBCUTANEOUS
  Administered 2015-08-14: 11 [IU] via SUBCUTANEOUS
  Administered 2015-08-15: 3 [IU] via SUBCUTANEOUS
  Administered 2015-08-15: 2 [IU] via SUBCUTANEOUS
  Administered 2015-08-15: 8 [IU] via SUBCUTANEOUS
  Administered 2015-08-16: 3 [IU] via SUBCUTANEOUS
  Administered 2015-08-16: 11 [IU] via SUBCUTANEOUS
  Administered 2015-08-17 (×2): 5 [IU] via SUBCUTANEOUS

## 2015-08-10 MED ORDER — SODIUM CHLORIDE 0.45 % IV SOLN
INTRAVENOUS | Status: DC
  Administered 2015-08-10: 22:00:00 via INTRAVENOUS

## 2015-08-10 MED ORDER — HYDROCORTISONE NA SUCCINATE PF 100 MG IJ SOLR
50.0000 mg | Freq: Four times a day (QID) | INTRAMUSCULAR | Status: DC
  Administered 2015-08-10 – 2015-08-12 (×6): 50 mg via INTRAVENOUS
  Filled 2015-08-10 (×7): qty 2

## 2015-08-10 MED ORDER — SODIUM CHLORIDE 0.9% FLUSH
3.0000 mL | Freq: Two times a day (BID) | INTRAVENOUS | Status: DC
  Administered 2015-08-13 – 2015-08-15 (×3): 3 mL via INTRAVENOUS

## 2015-08-10 MED ORDER — INSULIN GLARGINE 100 UNIT/ML ~~LOC~~ SOLN
7.0000 [IU] | Freq: Every day | SUBCUTANEOUS | Status: DC
  Administered 2015-08-10: 7 [IU] via SUBCUTANEOUS
  Filled 2015-08-10 (×2): qty 0.07

## 2015-08-10 MED ORDER — ONDANSETRON HCL 4 MG/2ML IJ SOLN: 4.0000 mg | Freq: Four times a day (QID) | INTRAMUSCULAR | Status: DC | PRN

## 2015-08-10 MED ORDER — VITAMIN B-1 100 MG PO TABS
100.0000 mg | ORAL_TABLET | Freq: Every day | ORAL | Status: DC
  Administered 2015-08-12 – 2015-08-17 (×6): 100 mg via ORAL
  Filled 2015-08-10 (×6): qty 1

## 2015-08-10 MED ORDER — SODIUM CHLORIDE 0.9% FLUSH
10.0000 mL | INTRAVENOUS | Status: DC | PRN
  Administered 2015-08-11 – 2015-08-15 (×10): 10 mL
  Administered 2015-08-16: 20 mL
  Filled 2015-08-10 (×11): qty 40

## 2015-08-10 MED ORDER — ALBUTEROL SULFATE (2.5 MG/3ML) 0.083% IN NEBU
2.5000 mg | INHALATION_SOLUTION | Freq: Four times a day (QID) | RESPIRATORY_TRACT | Status: DC | PRN
  Administered 2015-08-12: 2.5 mg via RESPIRATORY_TRACT

## 2015-08-10 NOTE — H&P (Signed)
[facility] History and Physical  Curtis Frazier [id number] DOB: 1940-11-10 DOA: 08/10/2015  Referring physician: [facility] PCP: Loura Pardon, MD   Chief Complaint: persistent ams/hypernatremia  HPI: Curtis Frazier is a 75 y.o. male 75 year old with a history of hypertension, A. fib, sepsis related to pneumonia with related shock, respiratory failure requiring intubation, A. fib with RVR, presents to room 2 W. 30 from [facility] chief complaint of persistent encephalopathy, acute on chronic renal failure, hypernatremia.  Information obtained from the chart and the daughter and son who are at the bedside. Reports he was doing fairly well at [facility] until several days ago he developed worsening lethargy and weakness. Initially they thought it was due to lack of sleep at night. In addition they reported that his sodium levels been high for several days. In addition planing of a sore throat with facial grimace during feeds and some choking and coughing with eating and drinking. No report or complaints of any chest pain palpitations headache. No report of diarrhea nausea vomiting. Reports on day of admission he is "sleeping more these ever slept "but admits "his respirations are more even". She also reports an event yesterday where he "stared out into space" and not respond. Episode lasted 20-30 seconds.  Upon admission afebrile and hemodynamically stable with a blood pressure on the soft side. Oxygen saturation level is 100% on 4 L.   Review of Systems:  Able to conduct review of systems with patient due to altered mental status. Information obtained from staff.  Past Medical History  Diagnosis Date  . Other and unspecified hyperlipidemia   . Coronary atherosclerosis of unspecified type of vessel, native or graft   . Personal history of colonic polyps   . Actinic keratosis   . 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 Other psoriasis   . Scoliosis (and kyphoscoliosis), idiopathic   . GI bleed 12/10   cecam AVM  . Gastritis 12/10  . Hyperlipidemia     takes Pravastatin daily  . Hypertension     takes Metoprolol daily  . Myocardial infarction ([place]) 2010  . Shortness of breath     with exertion  . Pneumonia     hx of-2008  . Headache(784.0)     occasionally  . Joint pain   . Joint swelling   . Rheumatoid arthritis(714.0)     takes Methotrexate 7pills weekly  . Degeneration of intervertebral disc, site unspecified   . Chronic back pain     scoliosis/stenosis/radiculopathy,degenerative disc disease  . Psoriasis   . Bruises easily   . Esophageal reflux     takes Omeprazole daily  . History of colonic polyps   . Hemorrhoids   . Kidney stone     hx of  . Blood transfusion 2010  . Type II or unspecified type diabetes mellitus without mention of complication, not stated as uncontrolled     type II;controlled by diet and exercise  . PMR (polymyalgia rheumatica) (HCC) 01/20/2012    tx by specialist on a long prednisone taper  . S/P PTCA (percutaneous transluminal coronary angioplasty)    Past Surgical History  Procedure Laterality Date  . Rotator cuff repair Left 2000  . Colonoscopy    . Vasectomy  1979  . Coronary angioplasty with stent placement  02/2009    has one stent  . Carotid doppler  10/12    0-39% R and 60-79% left   . Cataract extraction  2012  . Cardiac catheterization  2010  . Lithotripsy  2009  . Tee without cardioversion N/A 08/06/2015    Procedure: TRANSESOPHAGEAL ECHOCARDIOGRAM (TEE);  Surgeon: Skeet Latch, MD;  Location: [facility];  Service: Cardiovascular;  Laterality: N/A;  . Cardioversion N/A 08/06/2015    Procedure: CARDIOVERSION;  Surgeon: Skeet Latch, MD;  Location: [facility] ENDOSCOPY;  Service: Cardiovascular;  Laterality: N/A;   Social History:  reports that he has quit smoking. He has never used smokeless tobacco. He reports that he drinks alcohol. He reports that he does not use illicit drugs.  Allergies  Allergen Reactions  . Ace  Inhibitors Other (See Comments)    Causes high K  . Angiotensin Receptor Blockers Other (See Comments)    Causes high K  . Metoprolol Other (See Comments)    May cause elevated K level  . Penicillins Swelling and Rash  . Tetracycline Swelling and Rash    Family History  Problem Relation Age of Onset  . Coronary artery disease Mother   . Diabetes Mother   . CAD Mother   . Coronary artery disease Sister   . Hypertension Sister   . Kidney failure Sister   . CAD Sister   . Diabetes Brother   . Prostate cancer Brother   . Pancreatic cancer Brother   . Diabetes Brother   . Anesthesia problems Neg Hx   . Hypotension Neg Hx   . Malignant hyperthermia Neg Hx   . Pseudochol deficiency Neg Hx      Prior to Admission medications   Medication Sig Start Date End Date Taking? Authorizing Provider  acetylcysteine (MUCOMYST) 20 % nebulizer solution Take 4 mLs by nebulization 3 (three) times daily. 08/03/15   Verlee Monte, MD  amiodarone (PACERONE) 400 MG tablet Take 1 tablet (400 mg total) by mouth daily. 08/03/15   Verlee Monte, MD  Calcium Carbonate-Vit D-Min (CALCIUM 1200 PO) Take 1 tablet by mouth daily.     Historical Provider, MD  Cholecalciferol 1000 UNITS capsule Take 1,000 Units by mouth daily.      Historical Provider, MD  cyclobenzaprine (FLEXERIL) 10 MG tablet Take 10 mg by mouth 3 (three) times daily as needed for muscle spasms.    Historical Provider, MD  diltiazem (CARDIZEM) 90 MG tablet Take 1 tablet (90 mg total) by mouth every 6 (six) hours. 08/03/15   Verlee Monte, MD  fish oil-omega-3 fatty acids 1000 MG capsule Take 1 g by mouth daily.     Historical Provider, MD  folic acid (FOLVITE) 1 MG tablet Take 1 mg by mouth daily.  05/18/15   Historical Provider, MD  furosemide (LASIX) 10 MG/ML injection Inject 4 mLs (40 mg total) into the vein daily. 08/03/15   Verlee Monte, MD  guaiFENesin (ROBITUSSIN) 100 MG/5ML SOLN Take 10 mLs (200 mg total) by mouth every 4 (four) hours. 08/03/15    Verlee Monte, MD  heparin 100-0.45 UNIT/ML-% infusion Inject 1,550 Units/hr into the vein continuous. 08/03/15   Verlee Monte, MD  InFLIXimab (REMICADE IV) Inject 100 mg into the vein See admin instructions. Takes every 2 months    Historical Provider, MD  insulin glargine (LANTUS) 100 UNIT/ML injection Inject 0.1 mLs (10 Units total) into the skin daily. 08/03/15   Verlee Monte, MD  levalbuterol (XOPENEX) 0.63 MG/3ML nebulizer solution Take 3 mLs (0.63 mg total) by nebulization every 6 (six) hours. 08/03/15   Verlee Monte, MD  meropenem 1 g in sodium chloride 0.9 % 100 mL Inject 1 g into the vein every 12 (twelve) hours. 08/03/15   Verlee Monte,  MD  nitroGLYCERIN (NITROSTAT) 0.4 MG SL tablet Place 1 tablet (0.4 mg total) under the tongue every 5 (five) minutes as needed for chest pain (x 3 doses). 05/19/15   Eileen Stanford, PA-C  pantoprazole sodium (PROTONIX) 40 mg/20 mL PACK Take 20 mLs (40 mg total) by mouth daily. 08/03/15   Verlee Monte, MD  pravastatin (PRAVACHOL) 20 MG tablet TAKE 1 TABLET BY MOUTH DAILY 04/27/15   Abner Greenspan, MD  predniSONE (DELTASONE) 10 MG tablet Take 5 tablets (50 mg total) by mouth daily. 08/03/15   Verlee Monte, MD  Vancomycin (VANCOCIN) 750 MG/150ML SOLN Inject 150 mLs (750 mg total) into the vein daily. 08/03/15   Verlee Monte, MD  VOLTAREN 1 % GEL Takes 1 application topically daily as needed for pain 05/08/15   Historical Provider, MD  warfarin (COUMADIN) 5 MG tablet Take 1 tablet (5 mg total) by mouth daily at 6 PM. 08/03/15   Verlee Monte, MD   Physical Exam: Filed Vitals:   08/10/15 1548  BP: 92/58  Pulse: 69  Temp: 98 F (36.7 C)  TempSrc: Axillary  Resp: 24  SpO2: 100%    Wt Readings from Last 3 Encounters:  08/03/15 80.1 kg (176 lb 9.4 oz)  06/28/15 85.14 kg (187 lb 11.2 oz)  05/28/15 83.122 kg (183 lb 4 oz)    General:  Appears Somewhat obtunded but comfortable Eyes: PERRL, normal lids, irises & conjunctiva ENT: grossly normal hearing, lips &  tongue Neck: no LAD, masses or thyromegaly Cardiovascular: Louis irregular no m/r/g. No LE edema.  Respiratory: CTA bilaterally, somewhat shallow, no w/r/r. Normal respiratory effort. Abdomen: soft, ntnd positive bowel sounds no guarding Skin: no rash or induration seen on limited exam Musculoskeletal: grossly normal tone BUE/BLE Psychiatric: grossly normal mood and affect, speech fluent and appropriate Neurologic: attempts to open eyes to verbal stimuli. Will open eyes follow commands with mild painful stimuli. Bilateral grip 3 out of 5. Movement of extremities very sluggish           Labs on Admission:  Basic Metabolic Panel:  Recent Labs Lab 08/04/15 0650 08/05/15 0620 08/05/15 1156 08/06/15 0650 08/07/15 0615 08/08/15 0557 08/09/15 0540  NA 152*  --  151* 149* 152* 156* 151*  K 3.0*  --  3.9 3.5 3.4* 3.6 3.6  CL 104  --  111 110 113* 117* 114*  CO2 31  --  30 29 28 28 27   GLUCOSE 132*  --  168* 145* 201* 183* 195*  BUN 26*  --  32* 29* 27* 32* 34*  CREATININE 1.57*  --  1.82* 1.58* 1.47* 1.66* 1.80*  CALCIUM 8.9  --  9.8 9.1 8.8* 9.1 8.7*  MG 2.0 2.0  --  1.9 1.9  --  1.8  PHOS 2.8 4.8*  --  3.7 2.9  --  3.8   Liver Function Tests:  Recent Labs Lab 08/09/15 0540  ALBUMIN 2.4*   No results for input(s): LIPASE, AMYLASE in the last 168 hours. No results for input(s): AMMONIA in the last 168 hours. CBC:  Recent Labs Lab 08/04/15 0650 08/05/15 0620 08/06/15 0650 08/07/15 0615 08/09/15 0540  WBC 10.1 11.1* 12.5* 9.6 10.7*  NEUTROABS  --  8.1* 9.1* 6.5 7.4  HGB 10.9* 11.1* 10.8* 10.6* 10.9*  HCT 35.4* 35.9* 35.1* 35.6* 36.4*  MCV 100.0 101.1* 100.9* 99.7 100.6*  PLT 270 248 209 198 202   Cardiac Enzymes: No results for input(s): CKTOTAL, CKMB, CKMBINDEX, TROPONINI in the last 168  hours.  BNP (last 3 results)  Recent Labs  07/19/15 1622 08/01/15 0022 08/09/15 0540  BNP 1734.1* 881.4* 357.5*    ProBNP (last 3 results) No results for input(s):  PROBNP in the last 8760 hours.  CBG:  Recent Labs Lab 08/10/15 1707  GLUCAP 221*    Radiological Exams on Admission: No results found.  EKG: Pending  Assessment/Plan Principal Problem:   Encephalopathy Active Problems:   Diabetes mellitus type 2, controlled, without complications (HCC)   ANEMIA-UNSPECIFIED   Essential hypertension, benign   Coronary atherosclerosis   GERD   LLL pneumonia   PAF (paroxysmal atrial fibrillation) (HCC)   Hypernatremia   Acute on chronic renal failure (HCC)   Chronic diastolic congestive heart failure (HCC)  1. encephalopathy. Persistent ( 1 week) etiology unclear. May be related to metabolic derangement versus infection versus neurological event versus medication or combination -admit to tele -MRI -EEG -hold sedating meds -ABG -lactic acid -CMET -CBC -chest xray -ammonia level -B12, folate, RPR  #2. Hypernatremic. Sodium level 151. Chart review indicates sodium has been in the 150s for over a week. 2 weeks ago sodium level 133. Etiology unclear. has been on Lasix -Continue Lasix -monitor  #3. A. fib. On Coumadin. Chadvasc score 5. Recent hospitalization with RVR. Cardiology evaluated, underwent cardioversion. Patient is on amiodarone diltiazem.  Heart rate 69 on admission. Blood pressure soft and 92/58 -Obtain an EKG -Hold amiodarone and diltiazem -Monitor on telemetry -Heparin per pharmacy  #4. History of healthcare associated pneumonia complicated with sepsis acute respiratory failure requiring intubation. On vancomycin. No increased work of breathing not hypoxic -Pharmacy to dose bank -Portable chest x-ray -ABG  5. Acute on chronic renal failure stage III Creatinine on admission pending. Lab work from 2 days ago indicates creatinine trending up. Baseline appears to be 1.5 -Monitor urine output -Hold nephrotoxins -Gentle IV fluids  6. Hypertension. Blood pressure soft on admission. Home medications include amiodarone,  Cardizem, Lasix -Hold these for now -Monitor closely  #7. Diabetes. Serum glucose 195 yesterday. Home regimen includes 10 units of Lantus -We will obtain a hemoglobin A1c -Continue Lantus at lower dose -Sliding scale for optimal control  #8. Chronic diastolic heart failure. Recent echo with the EF of 65%. Await chest x-ray. Meds include Lasix 40 mg daily. Appears compensated -Daily weights -Intake and output -Resume amiodarone tomorrow if more alert -Continue Lasix  #9. Rheumatoid arthritis and PMR. I medications include 50 mg prednisone daily. Review indicates discharge summary dated a week ago intended for prednisone to be tapered quickly down to 5 mg daily -We'll hold for now secondary to #1 -Resume tomorrow  Neurology consulted. Will see tomorrow   Code Status: full DVT Prophylaxis: Family Communication: son and daughter at  Disposition Plan: to be determined  Time spent: 57 minutes  [facility]

## 2015-08-10 NOTE — Progress Notes (Signed)
ANTICOAGULATION CONSULT NOTE - Initial Consult  Pharmacy Consult for heparin/vancomycin Indication: atrial fibrillation/sepsis  Allergies  Allergen Reactions  . Ace Inhibitors Other (See Comments)    Causes high K  . Angiotensin Receptor Blockers Other (See Comments)    Causes high K  . Metoprolol Other (See Comments)    May cause elevated K level  . Penicillins Swelling and Rash  . Tetracycline Swelling and Rash    Patient Measurements:   Heparin Dosing Weight: 80 kg  Vital Signs: Temp: 98 F (36.7 C) (02/28 1548) Temp Source: Axillary (02/28 1548) BP: 92/58 mmHg (02/28 1548) Pulse Rate: 69 (02/28 1548)  Labs:  Recent Labs  08/08/15 0557 08/09/15 0540 08/10/15 0620  HGB  --  10.9*  --   HCT  --  36.4*  --   PLT  --  202  --   LABPROT 28.8* 29.8* 30.2*  INR 2.76* 2.89* 2.94*  CREATININE 1.66* 1.80*  --     Estimated Creatinine Clearance: 33.8 mL/min (by C-G formula based on Cr of 1.8).  Assessment: 75 y.o. male transferred back from [facility] after a seven-day stay with persistent AMS/hypernatremia. Pt was on coumadin for afib, INR 2.94 this morning. Pt is not taking PO, pharmacy is consulted to start IV heparin when INR < 2. Pt was also on vancomycin and meropenem, but per [facility] note, vanc and meropenem stop date are 2/28.   Goal of Therapy:  INR 2-3 Heparin level 0.3-0.7 units/ml Monitor platelets by anticoagulation protocol: Yes   Plan:  - Hold off heparin, monitor daily INR and po status - Start IV heparin when INR < 2 - F/u restarting coumadin when patient can tolerate PO - Not to start vancomycin since patient has already completed the course prior to transfer.  - We will f/u infection workup.   Maryanna Shape, PharmD, BCPS  Clinical Pharmacist  Pager: [phone]   08/10/2015,7:03 PM

## 2015-08-10 NOTE — Progress Notes (Signed)
Utilization review completed.  L. J. Dolora Ridgely RN, BSN, CM 

## 2015-08-10 NOTE — Progress Notes (Signed)
Came from [facility] with RT DL PICC.  Unable to aspirate purple port and red port is very sluggish.   Both ports difficult to flush with 10cc NS.   Lab notified to come draw lab work.    TPA being ordered per protocol for both ports.  Drsg is dated 2/19.

## 2015-08-10 NOTE — Consult Note (Signed)
Reason for Consult:  Altered Mental Status  Referring Physician: Dyanne Carrel, FNP    HPI: Curtis Frazier is a 75 y.o.  Right handed male referred for AMS. He is accompanied by his eldest son Grayland Ormond. Grayland Ormond provides the history. Additional history is obtained through review of the electronic medical record. The patient underwent an elective lumbar laminectomy in the beginning of February. Postop course was complicated by sepsis and altered mental status. Since then the patient has been in and out of rehab and [facility]. He underwent two EEGs that both demonstrated generalized slowing consistent with encephalopathy.  CT head and MRI brain demonstrated sinus inflammation but were otherwise unremarkable. Early in February he had mild hyponatremia. Since Feb 16th he has had hypernatremia. He has chronic renal insufficiency that has not significantly worsened. Recent Urine Cultures have been negative. Blood cultures were negative. He has not been receiving any narcotics or sedatives.  Per son, the patient's cognition fluctuates. Prior to surgery, the patient did not have signs of confusion or memory loss. Since surgery, he has periods in which he seems clearheaded and at othertimes is drowsy or confused.   Past Medical History  Diagnosis Date  . Other and unspecified hyperlipidemia   . Coronary atherosclerosis of unspecified type of vessel, native or graft   . Personal history of colonic polyps   . Actinic keratosis   . 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 Other psoriasis   . Scoliosis (and kyphoscoliosis), idiopathic   . GI bleed 12/10    cecam AVM  . Gastritis 12/10  . Hyperlipidemia     takes Pravastatin daily  . Hypertension     takes Metoprolol daily  . Myocardial infarction ([place]) 2010  . Shortness of breath     with exertion  . Pneumonia     hx of-2008  . Headache(784.0)     occasionally  . Joint pain   . Joint swelling   . Rheumatoid arthritis(714.0)     takes Methotrexate  7pills weekly  . Degeneration of intervertebral disc, site unspecified   . Chronic back pain     scoliosis/stenosis/radiculopathy,degenerative disc disease  . Psoriasis   . Bruises easily   . Esophageal reflux     takes Omeprazole daily  . History of colonic polyps   . Hemorrhoids   . Kidney stone     hx of  . Blood transfusion 2010  . Type II or unspecified type diabetes mellitus without mention of complication, not stated as uncontrolled     type II;controlled by diet and exercise  . PMR (polymyalgia rheumatica) ([facility]) 01/20/2012    tx by specialist on a long prednisone taper  . S/P PTCA (percutaneous transluminal coronary angioplasty)      Past Surgical History  Procedure Laterality Date  . Rotator cuff repair Left 2000  . Colonoscopy    . Vasectomy  1979  . Coronary angioplasty with stent placement  02/2009    has one stent  . Carotid doppler  10/12    0-39% R and 60-79% left   . Cataract extraction  2012  . Cardiac catheterization  2010  . Lithotripsy  2009  . Tee without cardioversion N/A 08/06/2015    Procedure: TRANSESOPHAGEAL ECHOCARDIOGRAM (TEE);  Surgeon: Skeet Latch, MD;  Location: [facility];  Service: Cardiovascular;  Laterality: N/A;  . Cardioversion N/A 08/06/2015    Procedure: CARDIOVERSION;  Surgeon: Skeet Latch, MD;  Location: [facility];  Service: Cardiovascular;  Laterality: N/A;  Family History  Problem Relation Age of Onset  . Coronary artery disease Mother   . Diabetes Mother   . CAD Mother   . Coronary artery disease Sister   . Hypertension Sister   . Kidney failure Sister   . CAD Sister   . Diabetes Brother   . Prostate cancer Brother   . Pancreatic cancer Brother   . Diabetes Brother   . Anesthesia problems Neg Hx   . Hypotension Neg Hx   . Malignant hyperthermia Neg Hx   . Pseudochol deficiency Neg Hx     Social History   Social History  . Marital Status: Married    Spouse Name: N/A  . Number of Children: N/A  .  Years of Education: N/A   Occupational History  . Not on file.   Social History Main Topics  . Smoking status: Former Research scientist (life sciences)  . Smokeless tobacco: Never Used     Comment: quit 2010  . Alcohol Use: 0.0 oz/week    0 Standard drinks or equivalent per week     Comment: occasional  . Drug Use: No  . Sexual Activity: No   Other Topics Concern  . Not on file   Social History Narrative    Allergies  Allergen Reactions  . Ace Inhibitors Other (See Comments)    Causes high K  . Angiotensin Receptor Blockers Other (See Comments)    Causes high K  . Metoprolol Other (See Comments)    May cause elevated K level  . Penicillins Swelling and Rash  . Tetracycline Swelling and Rash    No current facility-administered medications on file prior to encounter.   Current Outpatient Prescriptions on File Prior to Encounter  Medication Sig Dispense Refill  . warfarin (COUMADIN) 5 MG tablet Take 1 tablet (5 mg total) by mouth daily at 6 PM. (Patient taking differently: Take 5 mg by mouth daily at 6 PM. Dose it not in records. Last known dose was 5mg  daily.)    . acetylcysteine (MUCOMYST) 20 % nebulizer solution Take 4 mLs by nebulization 3 (three) times daily. (Patient not taking: Reported on 08/10/2015) 30 mL 12  . amiodarone (PACERONE) 400 MG tablet Take 1 tablet (400 mg total) by mouth daily. (Patient not taking: Reported on 08/10/2015)    . diltiazem (CARDIZEM) 90 MG tablet Take 1 tablet (90 mg total) by mouth every 6 (six) hours. (Patient not taking: Reported on 08/10/2015)    . furosemide (LASIX) 10 MG/ML injection Inject 4 mLs (40 mg total) into the vein daily. (Patient not taking: Reported on 08/10/2015) 4 mL 0  . guaiFENesin (ROBITUSSIN) 100 MG/5ML SOLN Take 10 mLs (200 mg total) by mouth every 4 (four) hours. (Patient not taking: Reported on 08/10/2015) 1200 mL 0  . heparin 100-0.45 UNIT/ML-% infusion Inject 1,550 Units/hr into the vein continuous. (Patient not taking: Reported on 08/10/2015)  250 mL   . insulin glargine (LANTUS) 100 UNIT/ML injection Inject 0.1 mLs (10 Units total) into the skin daily. (Patient not taking: Reported on 08/10/2015) 10 mL 11  . levalbuterol (XOPENEX) 0.63 MG/3ML nebulizer solution Take 3 mLs (0.63 mg total) by nebulization every 6 (six) hours. (Patient not taking: Reported on 08/10/2015) 3 mL 12  . meropenem 1 g in sodium chloride 0.9 % 100 mL Inject 1 g into the vein every 12 (twelve) hours. (Patient not taking: Reported on 08/10/2015)    . nitroGLYCERIN (NITROSTAT) 0.4 MG SL tablet Place 1 tablet (0.4 mg total) under the tongue every  5 (five) minutes as needed for chest pain (x 3 doses). (Patient not taking: Reported on 08/10/2015) 25 tablet 3  . pantoprazole sodium (PROTONIX) 40 mg/20 mL PACK Take 20 mLs (40 mg total) by mouth daily. (Patient not taking: Reported on 08/10/2015) 30 each   . pravastatin (PRAVACHOL) 20 MG tablet TAKE 1 TABLET BY MOUTH DAILY (Patient not taking: Reported on 08/10/2015) 30 tablet 5  . predniSONE (DELTASONE) 10 MG tablet Take 5 tablets (50 mg total) by mouth daily. (Patient not taking: Reported on 08/10/2015)    . Vancomycin (VANCOCIN) 750 MG/150ML SOLN Inject 150 mLs (750 mg total) into the vein daily. (Patient not taking: Reported on 08/10/2015) 4000 mL        Physical Exam:  General: Elderly male lying in hospital bed with loud breathing BP 101/57 mmHg  Pulse 84  Temp(Src) 98.7 F (37.1 C) (Oral)  Resp 20  SpO2 100%  CV: RRR no mumurs. Carotids no bruit bilaterally HEENT: Normocephalic. Atraumatic.  Lungs: Bilateral coarse rhonci Heart: RRR no murmurs Abdomen: S/NT/ND Ext- No C/C/E Neck: No lymphadenopathy Mental Status: Awake but appears tired. It is 10:30pm. Not oriented to age "40". He knows his DOB. He knows he is in hospital but says in "[place] or [place]". 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 He recognizes his son. He names "PEN" "Watch" "ID Badge". Speech is mildly slurred. He mumbles and is difficult  to understand. He is working hard to breath which makes it difficult to make out what he is saying at times. He follows commands with a little bit of difficulty. For instance, Finger nose finger confused him.  Cranial Nerves: PEARRL, EOMI, VFF, Facial sensation intact to temperature. No facial droop. Hearing intact bilaterally. Tongue protrudes midline. Motor: No drift of either arm or leg extended. Normal muscle bulk, power and tone. Sensory: Equal to light touch and vibration throughout.  Coord: Finger to nose intact. Heel to shin intact.     Assessment: Encephalopathy in setting of chronic renal insufficiency, hypernatremia and poor sleep.    Recommendations: 1. Repeat MRI brain 2. Check Serum NH3 3. Check Ionized calcium 4. Thiamine 100mg  p.o. Daily 5. Repeat EEG 6. Check TSH 7. Attempt to keep euglycemic 8. Avoid sedating medications

## 2015-08-11 ENCOUNTER — Inpatient Hospital Stay (HOSPITAL_COMMUNITY): Payer: Medicare Other

## 2015-08-11 ENCOUNTER — Encounter (HOSPITAL_COMMUNITY): Payer: Self-pay | Admitting: General Practice

## 2015-08-11 ENCOUNTER — Encounter (HOSPITAL_COMMUNITY)
Admission: AD | Disposition: A | Discharge status: Rehabilitation Facility | Payer: Self-pay | Source: Ambulatory Visit | Attending: Internal Medicine

## 2015-08-11 DIAGNOSIS — I959 Hypotension, unspecified: Secondary | ICD-10-CM

## 2015-08-11 DIAGNOSIS — J189 Pneumonia, unspecified organism: Secondary | ICD-10-CM

## 2015-08-11 DIAGNOSIS — I10 Essential (primary) hypertension: Secondary | ICD-10-CM

## 2015-08-11 LAB — URINALYSIS, ROUTINE W REFLEX MICROSCOPIC
Bilirubin Urine: NEGATIVE
Glucose, UA: NEGATIVE mg/dL
Hgb urine dipstick: NEGATIVE
Ketones, ur: NEGATIVE mg/dL
LEUKOCYTES UA: NEGATIVE
Nitrite: NEGATIVE
PROTEIN: NEGATIVE mg/dL
SPECIFIC GRAVITY, URINE: 1.012 (ref 1.005–1.030)
pH: 5.5 (ref 5.0–8.0)

## 2015-08-11 LAB — CBC
HCT: 35.4 % — ABNORMAL LOW (ref 39.0–52.0)
HEMOGLOBIN: 10.5 g/dL — AB (ref 13.0–17.0)
MCH: 29.6 pg (ref 26.0–34.0)
MCHC: 29.7 g/dL — ABNORMAL LOW (ref 30.0–36.0)
MCV: 99.7 fL (ref 78.0–100.0)
PLATELETS: 216 10*3/uL (ref 150–400)
RBC: 3.55 MIL/uL — AB (ref 4.22–5.81)
RDW: 16.5 % — ABNORMAL HIGH (ref 11.5–15.5)
WBC: 9.4 10*3/uL (ref 4.0–10.5)

## 2015-08-11 LAB — GLUCOSE, CAPILLARY
Glucose-Capillary: 216 mg/dL — ABNORMAL HIGH (ref 65–99)
Glucose-Capillary: 263 mg/dL — ABNORMAL HIGH (ref 65–99)
Glucose-Capillary: 217 mg/dL — ABNORMAL HIGH (ref 65–99)
Glucose-Capillary: 271 mg/dL — ABNORMAL HIGH (ref 65–99)

## 2015-08-11 LAB — BASIC METABOLIC PANEL
ANION GAP: 11 (ref 5–15)
Anion gap: 13 (ref 5–15)
BUN: 35 mg/dL — AB (ref 6–20)
BUN: 44 mg/dL — ABNORMAL HIGH (ref 6–20)
CALCIUM: 8.5 mg/dL — AB (ref 8.9–10.3)
CALCIUM: 7.9 mg/dL — AB (ref 8.9–10.3)
CO2: 23 mmol/L (ref 22–32)
CO2: 24 mmol/L (ref 22–32)
CREATININE: 1.94 mg/dL — AB (ref 0.61–1.24)
CREATININE: 2.12 mg/dL — AB (ref 0.61–1.24)
Chloride: 113 mmol/L — ABNORMAL HIGH (ref 101–111)
Chloride: 115 mmol/L — ABNORMAL HIGH (ref 101–111)
GFR calc Af Amer: 37 mL/min — ABNORMAL LOW (ref >60)
GFR calc non Af Amer: 32 mL/min — ABNORMAL LOW (ref >60)
GFR, EST AFRICAN AMERICAN: 33 mL/min — AB (ref >60)
GFR, EST NON AFRICAN AMERICAN: 29 mL/min — AB (ref >60)
GLUCOSE: 221 mg/dL — AB (ref 65–99)
Glucose, Bld: 236 mg/dL — ABNORMAL HIGH (ref 65–99)
Potassium: 4.5 mmol/L (ref 3.5–5.1)
Potassium: 4.1 mmol/L (ref 3.5–5.1)
SODIUM: 150 mmol/L — AB (ref 135–145)
Sodium: 149 mmol/L — ABNORMAL HIGH (ref 135–145)

## 2015-08-11 LAB — HEMOGLOBIN A1C
HEMOGLOBIN A1C: 7.5 % — AB (ref 4.8–5.6)
MEAN PLASMA GLUCOSE: 169 mg/dL

## 2015-08-11 LAB — T4, FREE: Free T4: 1.53 ng/dL — ABNORMAL HIGH (ref 0.61–1.12)

## 2015-08-11 LAB — FOLATE RBC
FOLATE, HEMOLYSATE: 550.4 ng/mL
Folate, RBC: 1533 ng/mL (ref >498)
HEMATOCRIT: 35.9 % — AB (ref 37.5–51.0)

## 2015-08-11 LAB — TROPONIN I: TROPONIN I: 0.05 ng/mL — AB (ref <0.031)

## 2015-08-11 LAB — PROTIME-INR
INR: 3.11 — ABNORMAL HIGH (ref 0.00–1.49)
Prothrombin Time: 31.4 seconds — ABNORMAL HIGH (ref 11.6–15.2)

## 2015-08-11 LAB — TSH: TSH: 0.319 u[IU]/mL — AB (ref 0.350–4.500)

## 2015-08-11 LAB — RPR: RPR Ser Ql: NONREACTIVE

## 2015-08-11 LAB — LACTIC ACID, PLASMA
LACTIC ACID, VENOUS: 2 mmol/L (ref 0.5–2.0)
Lactic Acid, Venous: 1.4 mmol/L (ref 0.5–2.0)

## 2015-08-11 LAB — MAGNESIUM: MAGNESIUM: 1.8 mg/dL (ref 1.7–2.4)

## 2015-08-11 SURGERY — RADIOLOGY WITH ANESTHESIA
Anesthesia: General

## 2015-08-11 MED ORDER — DEXTROSE 5 % IV SOLN
INTRAVENOUS | Status: DC
  Administered 2015-08-11: 10:00:00 via INTRAVENOUS
  Filled 2015-08-11: qty 1000

## 2015-08-11 MED ORDER — SODIUM CHLORIDE 0.9 % IV SOLN
INTRAVENOUS | Status: DC
  Administered 2015-08-11: 13:00:00 via INTRAVENOUS

## 2015-08-11 MED ORDER — RESOURCE THICKENUP CLEAR PO POWD
Freq: Once | ORAL | Status: AC
  Administered 2015-08-11: 15:00:00 via ORAL
  Filled 2015-08-11: qty 125

## 2015-08-11 MED ORDER — FENTANYL CITRATE (PF) 250 MCG/5ML IJ SOLN
INTRAMUSCULAR | Status: AC
  Filled 2015-08-11: qty 5

## 2015-08-11 MED ORDER — SODIUM CHLORIDE 0.9 % IV BOLUS (SEPSIS)
1000.0000 mL | Freq: Once | INTRAVENOUS | Status: AC
  Administered 2015-08-11: 1000 mL via INTRAVENOUS

## 2015-08-11 MED ORDER — INSULIN GLARGINE 100 UNIT/ML ~~LOC~~ SOLN
10.0000 [IU] | Freq: Every day | SUBCUTANEOUS | Status: DC
  Administered 2015-08-11 – 2015-08-12 (×2): 10 [IU] via SUBCUTANEOUS
  Filled 2015-08-11 (×3): qty 0.1

## 2015-08-11 MED ORDER — CETYLPYRIDINIUM CHLORIDE 0.05 % MT LIQD
7.0000 mL | Freq: Two times a day (BID) | OROMUCOSAL | Status: DC
  Administered 2015-08-11 – 2015-08-17 (×9): 7 mL via OROMUCOSAL

## 2015-08-11 NOTE — Anesthesia Preprocedure Evaluation (Deleted)
Anesthesia Evaluation  Patient identified by MRN, date of birth, ID band Patient awake    Reviewed: Allergy & Precautions, NPO status , Patient's Chart, lab work & pertinent test results  Airway Mallampati: II  TM Distance: >3 FB Neck ROM: Full    Dental  (+) Partial Upper   Pulmonary shortness of breath, former smoker,     + wheezing      Cardiovascular hypertension, + CAD, + Past MI and +CHF  + Valvular Problems/Murmurs  Rhythm:Irregular Rate:Abnormal     Neuro/Psych  Headaches, PSYCHIATRIC DISORDERS    GI/Hepatic Neg liver ROS, GERD  Medicated,  Endo/Other  diabetes, Type 1, Insulin Dependent  Renal/GU Renal disease  negative genitourinary   Musculoskeletal  (+) Arthritis ,   Abdominal   Peds negative pediatric ROS (+)  Hematology   Anesthesia Other Findings   Reproductive/Obstetrics negative OB ROS                            Lab Results  Component Value Date   WBC 8.0 08/10/2015   HGB 11.3* 08/10/2015   HCT 37.5* 08/10/2015   MCV 101.1* 08/10/2015   PLT 202 08/10/2015   Lab Results  Component Value Date   CREATININE 1.94* 08/11/2015   BUN 35* 08/11/2015   NA 149* 08/11/2015   K 4.5 08/11/2015   CL 113* 08/11/2015   CO2 23 08/11/2015   Lab Results  Component Value Date   INR 3.11* 08/11/2015   INR 2.94* 08/10/2015   INR 2.89* 08/09/2015   07/2015 Echo - Left ventricle: The cavity size was normal. Wall thickness was normal. Systolic function was vigorous. The estimated ejection fraction was in the range of 65% to 70%. Indeterminant diastolic function. Although no diagnostic regional wall motion abnormality was identified, this possibility cannot be completely excluded on the basis of this study. - Aortic valve: Moderately calcified leaflets. There was no stenosis. - Mitral valve: Mildly calcified annulus. There was trivial regurgitation. - Right ventricle: The cavity  size was normal. Systolic function was normal. - Tricuspid valve: Peak RV-RA gradient (S): 29 mm Hg. - Pulmonary arteries: PA peak pressure: 32 mm Hg (S). - Inferior vena cava: The vessel was normal in size. The respirophasic diameter changes were in the normal range (>= 50%), consistent with normal central venous pressure.  07/21/2015 EKG: Afib with RVR  Anesthesia Physical  Anesthesia Plan  ASA: IV  Anesthesia Plan: General   Post-op Pain Management:    Induction: Intravenous  Airway Management Planned: Oral ETT  Additional Equipment:   Intra-op Plan:   Post-operative Plan: Possible Post-op intubation/ventilation  Informed Consent: I have reviewed the patients History and Physical, chart, labs and discussed the procedure including the risks, benefits and alternatives for the proposed anesthesia with the patient or authorized representative who has indicated his/her understanding and acceptance.   Dental advisory given  Plan Discussed with: CRNA and Anesthesiologist  Anesthesia Plan Comments:        Anesthesia Quick Evaluation

## 2015-08-11 NOTE — Procedures (Signed)
  Current facility-administered medications:  .  0.9 %  sodium chloride infusion, , Intravenous, Continuous, Jillyn Hidden, MD, Last Rate: 10 mL/hr at 08/11/15 1236 .  acetaminophen (TYLENOL) tablet 650 mg, 650 mg, Oral, Q6H PRN **OR** acetaminophen (TYLENOL) suppository 650 mg, 650 mg, Rectal, Q6H PRN, Lezlie Octave Black, NP .  albuterol (PROVENTIL) (2.5 MG/3ML) 0.083% nebulizer solution 2.5 mg, 2.5 mg, Nebulization, Q6H PRN, Waldemar Dickens, MD .  albuterol (PROVENTIL) (2.5 MG/3ML) 0.083% nebulizer solution 3 mL, 3 mL, Nebulization, TID, Waldemar Dickens, MD, 3 mL at 08/11/15 2031 .  amiodarone (PACERONE) tablet 400 mg, 400 mg, Oral, Daily, Lezlie Octave Black, NP, 400 mg at 08/11/15 1000 .  antiseptic oral rinse (CPC / CETYLPYRIDINIUM CHLORIDE 0.05%) solution 7 mL, 7 mL, Mouth Rinse, BID, Irine Seal V, MD .  dextrose 5 % 1,000 mL infusion, , Intravenous, Continuous, Eugenie Filler, MD, Last Rate: 75 mL/hr at 08/11/15 0932 .  hydrocortisone sodium succinate (SOLU-CORTEF) 100 MG injection 50 mg, 50 mg, Intravenous, Q6H, Radene Gunning, NP, 50 mg at 08/11/15 1906 .  insulin aspart (novoLOG) injection 0-15 Units, 0-15 Units, Subcutaneous, TID WC, Radene Gunning, NP, 5 Units at 08/11/15 1731 .  insulin aspart (novoLOG) injection 0-5 Units, 0-5 Units, Subcutaneous, QHS, Radene Gunning, NP, 2 Units at 08/10/15 2222 .  insulin glargine (LANTUS) injection 10 Units, 10 Units, Subcutaneous, QHS, Irine Seal V, MD .  ondansetron [facility]) tablet 4 mg, 4 mg, Oral, Q6H PRN **OR** ondansetron (ZOFRAN) injection 4 mg, 4 mg, Intravenous, Q6H PRN, Lezlie Octave Black, NP .  sodium chloride flush (NS) 0.9 % injection 10-40 mL, 10-40 mL, Intracatheter, PRN, Waldemar Dickens, MD, 10 mL at 08/11/15 1716 .  sodium chloride flush (NS) 0.9 % injection 3 mL, 3 mL, Intravenous, Q12H, Lezlie Octave Black, NP, 3 mL at 08/10/15 2200 .  thiamine (VITAMIN B-1) tablet 100 mg, 100 mg, Oral, Daily, Norwood Levo, MD, 100 mg at 08/11/15  1000  Introduction:  This is a 19 channel routine scalp EEG performed at the bedside with bipolar and monopolar montages arranged in accordance to the international 10/20 system of electrode placement. One channel was dedicated to EKG recording.    Findings:  Mild slowing of the background rhythm was noted, in the range of 6.5-7 Hz alpha . No definite evidence of abnormal epileptiform discharges or electrographic seizures were noted during this recording.   Impression:  This is an abnormal awake and drowsy routine inpatient EEG suggestive of mild encephalopathy. Clinical correlation is recommended .

## 2015-08-11 NOTE — Progress Notes (Signed)
ANTICOAGULATION CONSULT NOTE - Initial Consult  Pharmacy Consult for heparin Indication: h/o atrial fibrillation  Allergies  Allergen Reactions  . Ace Inhibitors Other (See Comments)    Causes high K  . Angiotensin Receptor Blockers Other (See Comments)    Causes high K  . Metoprolol Other (See Comments)    May cause elevated K level  . Penicillins Swelling and Rash  . Tetracycline Swelling and Rash    Patient Measurements: Weight: 168 lb 10.4 oz (76.5 kg) Heparin Dosing Weight: 80 kg  Vital Signs: Temp: 98.3 F (36.8 C) (03/01 0338) Temp Source: Axillary (03/01 0338) BP: 107/52 mmHg (03/01 0350) Pulse Rate: 88 (03/01 0350)  Labs:  Recent Labs  08/09/15 0540 08/10/15 0620 08/10/15 1940 08/11/15 0210  HGB 10.9*  --  11.3*  --   HCT 36.4*  --  37.5*  --   PLT 202  --  202  --   LABPROT 29.8* 30.2*  --  31.4*  INR 2.89* 2.94*  --  3.11*  CREATININE 1.80*  --  1.84* 1.94*    Estimated Creatinine Clearance: 31.3 mL/min (by C-G formula based on Cr of 1.94).  Assessment: 75 y.o. male transferred back from [facility] after a seven-day stay with persistent AMS/hypernatremia. Pt was on coumadin for afib. Pt is not taking PO, pharmacy was consulted to start IV heparin when INR < 2.  The INR increased to 3.11 today.  Thus no IV heparin needed currently.    Goal of Therapy:  INR 2-3 Heparin level 0.3-0.7 units/ml Monitor platelets by anticoagulation protocol: Yes   Plan:  - Hold off heparin, monitor daily INR and po status - Start IV heparin when INR < 2 - F/u restarting coumadin when patient can tolerate PO   Nicole Cella, RPh Clinical Pharmacist Pager: [phone]  08/11/2015,12:20 PM

## 2015-08-11 NOTE — Progress Notes (Signed)
Subjective: patient has improved per family. He now has a hoarse voice and has thrush on the back of his palate.   Exam: Filed Vitals:   08/11/15 1315 08/11/15 1403  BP: 105/67 104/67  Pulse:  84  Temp:    Resp:  20     Gen: In bed, NAD MS: alert and oriented CN: 2-12 intact Motor: MAEW with asterixis noted when hands held outstretched Sensory: full   Pertinent Labs: MRI and EEG pending TSH 0.319 Ammonia 15  Etta Quill PA-C [facility] [phone]  Impression: Encephalopathy--likely Metabolic and slowly improving. Awaiting MRI and EEG   Seen with Dr. Silverio Decamp. Please see his attestation note for A/P for any additional work up recommendations.     08/11/2015, 2:28 PM

## 2015-08-11 NOTE — Progress Notes (Signed)
Patient's HR low 100's - 140's.  BP as low as 76/48.  Dr. Jillyn Hidden and Dr. Grandville Silos made aware.  Daughter at bedside.  Received verbal order from Dr. Grandville Silos to give 1000 ml NS bolus and transfer patient to back to floor.  Nurse from [place] at bedside to help transfer patient to floor.

## 2015-08-11 NOTE — Progress Notes (Signed)
[facility] PROGRESS NOTE  Curtis Frazier [id number] DOB: 1941-03-26 DOA: 08/10/2015 PCP: Loura Pardon, MD  Assessment/Plan: #1 hypotension Questionable etiology. Likely secondary to volume depletion in the setting of IV Lasix this morning. Patient admitted with altered mental status and some electrolyte derangements including hypernatremia. Patient looks clinically dry on examination. Patient is afebrile. Chest x-ray negative. Check blood cultures 2. Check a EKG. Will check a UA with cultures and sensitivities. Check a lactic acid level. Normal saline 1 L bolus. Then placed on D5W at 75 mL per hour. DC IV Lasix.  #2 atrial fibrillation with RVR CHADSVASC 5 Patient noted in the PACU to have heart rate in the 130s however patient's systolic blood pressure was in the 70s. Elevated heart rate may be secondary to hypotension. Patient being given IV fluids with some improvement in heart rate. Patient also did not receive his medications of amiodarone. Continue IV fluids. Blood pressure sustains may give home regimen of amiodarone. If patient heart rate sustains in the 130s will likely need cardiology input. Continue Coumadin. Follow for now.  #3 acute encephalopathy Questionable etiology. Serum ammonia levels were within normal limits. Unable to perform MRI today under anesthesia due to patient's hemodynamic instability and will likely need to reschedule MRI. EEG pending. TSH slightly low at 0.319. B-12 level is elevated at 4196. Continue thiamine. Avoid sedating medications. Neurology following and appreciate input and recommendations.  #4 hypernatremia Sodium level on admission was 151. Sodium level today is 149. Patient looks dehydrated and volume depleted on examination. Patient had been on 40mg  of Lasix IV daily. Discontinue Lasix. Change IV fluids from half normal saline to D5W. Follow.  #5 history of healthcare associated pneumonia complicated with sepsis/acute renal failure requiring  intubation Patient currently afebrile. Patient stable. Chest x-ray negative. On vancomycin.  #6 acute on chronic renal failure stage III Baseline creatinine about 1.5. Creatinine 1.9 today. Hold nephrotoxins. Discontinue IV Lasix. Place on IV fluids. Follow.  #7 hypertension Discontinue Lasix at this time secondary to hypotension. IV fluids. Follow.  #8 diabetes mellitus Hemoglobin A1c 7.5. CBGs have ranged from 216-263. Patient on IV Solu-Cortef. Continue Lantus and sliding scale insulin.  #9 chronic diastolic heart failure 2-D echo with EF of 65%. Chest x-ray negative. Patient looks more on the dry side today with hypotension. Discontinue IV Lasix. On amiodarone. Follow.  #10 rheumatoid arthritis/PMR Patient was on prednisone 50 mg daily which was to be tapered down to 5 mg daily. Patient currently on stress dose steroids. Follow.  Code Status: Full Family Communication: Updated patient and daughter at bedside. Disposition Plan: Transfer back to the floor. If no improvement in heart rate or systolic blood pressure, will need to transfer to stepdown.   Consultants:  Neurology: Dr. Lavone Neri 08/10/2015  Procedures:  Chest x-ray 08/10/2015  Antibiotics:  None  HPI/Subjective: Was called by nursing, patient in short stay with systolic blood pressures in the 70s and heart rate ranging from 110s to the 130. Patient alert and following commands denies any shortness of breath. Denies any chest pain. Patient asking for something to drink. Patient receiving a fluid bolus.  Objective: Filed Vitals:   08/11/15 0338 08/11/15 0350  BP: 89/49 107/52  Pulse: 92 88  Temp: 98.3 F (36.8 C)   Resp: 18     Intake/Output Summary (Last 24 hours) at 08/11/15 1304 Last data filed at 08/11/15 1031  Gross per 24 hour  Intake     10 ml  Output    575 ml  Net   -565 ml   Filed Weights   08/11/15 0422  Weight: 76.5 kg (168 lb 10.4 oz)    Exam:   General:  Laying in bed in PACU.Dry  mucous membranes.  Cardiovascular: Irregularly irregular  Respiratory: Clear to auscultation bilaterally anterior lung fields.  Abdomen: Soft, nontender, nondistended, positive bowel sounds.  Musculoskeletal: No clubbing cyanosis or edema.  Data Reviewed: Basic Metabolic Panel:  Recent Labs Lab 08/05/15 0620  08/06/15 0650 08/07/15 0615 08/08/15 0557 08/09/15 0540 08/10/15 1940 08/11/15 0210  NA  --   < > 149* 152* 156* 151* 149* 149*  K  --   < > 3.5 3.4* 3.6 3.6 4.9 4.5  CL  --   < > 110 113* 117* 114* 113* 113*  CO2  --   < > 29 28 28 27 26 23   GLUCOSE  --   < > 145* 201* 183* 195* 239* 221*  BUN  --   < > 29* 27* 32* 34* 30* 35*  CREATININE  --   < > 1.58* 1.47* 1.66* 1.80* 1.84* 1.94*  CALCIUM  --   < > 9.1 8.8* 9.1 8.7* 8.7* 8.5*  MG 2.0  --  1.9 1.9  --  1.8  --   --   PHOS 4.8*  --  3.7 2.9  --  3.8  --   --   < > = values in this interval not displayed. Liver Function Tests:  Recent Labs Lab 08/09/15 0540 08/10/15 1940  AST  --  21  ALT  --  25  ALKPHOS  --  84  BILITOT  --  1.3*  PROT  --  6.4*  ALBUMIN 2.4* 2.4*   No results for input(s): LIPASE, AMYLASE in the last 168 hours.  Recent Labs Lab 08/10/15 1940  AMMONIA 15   CBC:  Recent Labs Lab 08/05/15 0620 08/06/15 0650 08/07/15 0615 08/09/15 0540 08/10/15 1940  WBC 11.1* 12.5* 9.6 10.7* 8.0  NEUTROABS 8.1* 9.1* 6.5 7.4 6.6  HGB 11.1* 10.8* 10.6* 10.9* 11.3*  HCT 35.9* 35.1* 35.6* 36.4* 37.5*  MCV 101.1* 100.9* 99.7 100.6* 101.1*  PLT 248 209 198 202 202   Cardiac Enzymes: No results for input(s): CKTOTAL, CKMB, CKMBINDEX, TROPONINI in the last 168 hours. BNP (last 3 results)  Recent Labs  07/19/15 1622 08/01/15 0022 08/09/15 0540  BNP 1734.1* 881.4* 357.5*    ProBNP (last 3 results) No results for input(s): PROBNP in the last 8760 hours.  CBG:  Recent Labs Lab 08/10/15 1707 08/10/15 2107 08/11/15 0608 08/11/15 1124  GLUCAP 221* 224* 216* 263*    Recent Results  (from the past 240 hour(s))  Culture, Urine     Status: None   Collection Time: 08/05/15  3:30 PM  Result Value Ref Range Status   Specimen Description URINE, RANDOM  Final   Special Requests NONE  Final   Culture 5,000 COLONIES/mL INSIGNIFICANT GROWTH  Final   Report Status 08/06/2015 FINAL  Final     Studies: Portable Chest 1 View  08/10/2015  CLINICAL DATA:  Shortness of breath EXAM: PORTABLE CHEST 1 VIEW COMPARISON:  08/05/2015 FINDINGS: Low lung volumes with bibasilar atelectasis, worse on the left. Stable cardiomegaly without edema or effusion. No pneumothorax. Right PICC line tip mid SVC level. Residual scoliosis of the spine. Thoracolumbar fusion hardware partially imaged. Trachea is midline. IMPRESSION: Cardiomegaly with basilar atelectasis, worse on the left. Electronically Signed   By: Jerilynn Mages.  Shick M.D.   On: 08/10/2015  18:35    Scheduled Meds: . [MAR Hold] albuterol  3 mL Nebulization TID  . [MAR Hold] amiodarone  400 mg Oral Daily  . [MAR Hold] furosemide  40 mg Intravenous Daily  . [MAR Hold] hydrocortisone sod succinate (SOLU-CORTEF) inj  50 mg Intravenous Q6H  . [MAR Hold] insulin aspart  0-15 Units Subcutaneous TID WC  . [MAR Hold] insulin aspart  0-5 Units Subcutaneous QHS  . [MAR Hold] insulin glargine  7 Units Subcutaneous QHS  . sodium chloride  1,000 mL Intravenous Once  . [MAR Hold] sodium chloride flush  3 mL Intravenous Q12H  . [MAR Hold] thiamine  100 mg Oral Daily   Continuous Infusions: . sodium chloride 10 mL/hr at 08/11/15 1236  . dextrose 5 % 1,000 mL infusion 75 mL/hr at 08/11/15 0932    Principal Problem:   Encephalopathy Active Problems:   Diabetes mellitus type 2, controlled, without complications (HCC)   ANEMIA-UNSPECIFIED   Essential hypertension, benign   Coronary atherosclerosis   GERD   LLL pneumonia   PAF (paroxysmal atrial fibrillation) (HCC)   Hypernatremia   Acute on chronic renal failure (HCC)   Chronic diastolic congestive heart  failure (Home)   Hypotension    Time spent: Viera [place] MD [facility] Pager [phone]. If 7PM-7AM, please contact night-coverage at [email or web address], password [facility] 08/11/2015, 1:04 PM  LOS: 1 day

## 2015-08-11 NOTE — Progress Notes (Signed)
EEG completed, results pending. 

## 2015-08-11 NOTE — Evaluation (Signed)
Clinical/Bedside Swallow Evaluation Patient Details  Name: Curtis Frazier MRN: [id number] Date of Birth: [birth year]  Today's Date: 08/11/2015 Time:      2-2:30 pm  Past Medical History:  Past Medical History  Diagnosis Date  . Other and unspecified hyperlipidemia   . Coronary atherosclerosis of unspecified type of vessel, native or graft   . Personal history of colonic polyps   . Actinic keratosis   . Psoriatic arthropathy ([place])   . Other psoriasis   . Scoliosis (and kyphoscoliosis), idiopathic   . GI bleed 12/10    cecam AVM  . Gastritis 12/10  . Hyperlipidemia     takes Pravastatin daily  . Hypertension     takes Metoprolol daily  . Myocardial infarction ([place]) 2010  . Shortness of breath     with exertion  . Pneumonia     hx of-2008  . Headache(784.0)     occasionally  . Joint pain   . Joint swelling   . Rheumatoid arthritis(714.0)     takes Methotrexate 7pills weekly  . Degeneration of intervertebral disc, site unspecified   . Chronic back pain     scoliosis/stenosis/radiculopathy,degenerative disc disease  . Psoriasis   . Bruises easily   . Esophageal reflux     takes Omeprazole daily  . History of colonic polyps   . Hemorrhoids   . Kidney stone     hx of  . Blood transfusion 2010  . Type II or unspecified type diabetes mellitus without mention of complication, not stated as uncontrolled     type II;controlled by diet and exercise  . PMR (polymyalgia rheumatica) (HCC) 01/20/2012    tx by specialist on a long prednisone taper  . S/P PTCA (percutaneous transluminal coronary angioplasty)    Past Surgical History:  Past Surgical History  Procedure Laterality Date  . Rotator cuff repair Left 2000  . Colonoscopy    . Vasectomy  1979  . Coronary angioplasty with stent placement  02/2009    has one stent  . Carotid doppler  10/12    0-39% R and 60-79% left   . Cataract extraction  2012  . Cardiac catheterization  2010  . Lithotripsy  2009  . Tee without  cardioversion N/A 08/06/2015    Procedure: TRANSESOPHAGEAL ECHOCARDIOGRAM (TEE);  Surgeon: Skeet Latch, MD;  Location: [facility];  Service: Cardiovascular;  Laterality: N/A;  . Cardioversion N/A 08/06/2015    Procedure: CARDIOVERSION;  Surgeon: Skeet Latch, MD;  Location: [facility] ENDOSCOPY;  Service: Cardiovascular;  Laterality: N/A;   HPI:  75 year old male with history of hypertension, gerd, A. fib, sepsis related to pneumonia with related shock, respiratory failure requiring intubation, A. [facility] suspicious for infectious vs medication induced etiologies. MRI pending. MBS 2/20 with a mild-moderate oropharyngeal dysphagia with recommendations for dysphagia 1, honey thick liquids from spoon only.    Assessment / Plan / Recommendation Clinical Impression  Pt continues to present with s/s of dysphagia - most notable is dysphonia, with decreased volume, decreased cough, and impaired vocal quality.  Pt has not demonstrated improved vocal function since seen by SLP services on 2/20. Clinical presentation is similar to dysphagia s/s that were present when D/Cd to [facility].  Recommend continuing dysphagia 1 diet, allow honey thick liquids from a spoon only.  REC FEES next date to determine function of larynx during swallow.  Family (son and dtr) present for assessment; agree with plan.     Aspiration  Risk  Moderate aspiration risk    Diet Recommendation   dysphagia 1, honey-thick liquids from spoon only; FEES next date  Medication Administration: Crushed with puree    Other  Recommendations Oral Care Recommendations: Oral care BID Other Recommendations: Order thickener from pharmacy   Follow up Recommendations       Frequency and Duration min 3x week  2 weeks       Prognosis Prognosis for Safe Diet Advancement: Good      Swallow Study   General Date of Onset: 07/17/15 HPI: 75 year old male with history of hypertension, gerd, A.  fib, sepsis related to pneumonia with related shock, respiratory failure requiring intubation, A. fib with RVRad[facility] with [facility] suspicious for infectious vs medication induced etiologies. MRI pending. MBS 2/20 with a mild-moderate oropharyngeal dysphagia with recommendations for dysphagia 1, honey thick liquids.  Type of Study: Bedside Swallow Evaluation Previous Swallow Assessment: see HPI Diet Prior to this Study: Dysphagia 1 (puree);Honey-thick liquids Temperature Spikes Noted: No Respiratory Status: Nasal cannula History of Recent Intubation: Yes Length of Intubations (days): 8 days Date extubated: 07/28/15 Behavior/Cognition: Alert;Cooperative;Pleasant mood;Confused Oral Cavity Assessment:  (thrush) Oral Care Completed by SLP: No Oral Cavity - Dentition: Dentures, bottom;Adequate natural dentition Vision: Functional for self-feeding Self-Feeding Abilities: Able to feed self Patient Positioning: Upright in bed Baseline Vocal Quality: Hoarse;Breathy;Low vocal intensity Volitional Cough: Weak Volitional Swallow: Able to elicit    Oral/Motor/Sensory Function Overall Oral Motor/Sensory Function: Within functional limits   Ice Chips Ice chips: Not tested   Thin Liquid Thin Liquid: Not tested    Nectar Thick Nectar Thick Liquid: Not tested   Honey Thick Honey Thick Liquid: Impaired Presentation: Cup;Spoon Pharyngeal Phase Impairments: Cough - Immediate (coughs with sips from cup; no cough with head turn to right )   Puree Puree: Within functional limits   Solid  Lemonte Al L. [place], [place] CCC/SLP Pager [phone]    Solid: Not tested        Juan Quam Laurice 08/11/2015,2:46 PM

## 2015-08-12 ENCOUNTER — Other Ambulatory Visit: Payer: Self-pay | Admitting: Family Medicine

## 2015-08-12 ENCOUNTER — Inpatient Hospital Stay (HOSPITAL_COMMUNITY): Payer: Medicare Other

## 2015-08-12 DIAGNOSIS — I5032 Chronic diastolic (congestive) heart failure: Secondary | ICD-10-CM

## 2015-08-12 DIAGNOSIS — R5381 Other malaise: Secondary | ICD-10-CM

## 2015-08-12 DIAGNOSIS — E119 Type 2 diabetes mellitus without complications: Secondary | ICD-10-CM

## 2015-08-12 DIAGNOSIS — B370 Candidal stomatitis: Secondary | ICD-10-CM | POA: Clinically undetermined

## 2015-08-12 DIAGNOSIS — N189 Chronic kidney disease, unspecified: Secondary | ICD-10-CM

## 2015-08-12 DIAGNOSIS — D649 Anemia, unspecified: Secondary | ICD-10-CM

## 2015-08-12 DIAGNOSIS — G9340 Encephalopathy, unspecified: Secondary | ICD-10-CM

## 2015-08-12 DIAGNOSIS — R339 Retention of urine, unspecified: Secondary | ICD-10-CM | POA: Insufficient documentation

## 2015-08-12 DIAGNOSIS — N179 Acute kidney failure, unspecified: Secondary | ICD-10-CM

## 2015-08-12 LAB — BASIC METABOLIC PANEL
Anion gap: 6 (ref 5–15)
BUN: 38 mg/dL — AB (ref 6–20)
CALCIUM: 7.2 mg/dL — AB (ref 8.9–10.3)
CO2: 24 mmol/L (ref 22–32)
CREATININE: 1.65 mg/dL — AB (ref 0.61–1.24)
Chloride: 105 mmol/L (ref 101–111)
GFR calc Af Amer: 45 mL/min — ABNORMAL LOW (ref >60)
GFR calc non Af Amer: 39 mL/min — ABNORMAL LOW (ref >60)
GLUCOSE: 582 mg/dL — AB (ref 65–99)
Potassium: 3.7 mmol/L (ref 3.5–5.1)
Sodium: 135 mmol/L (ref 135–145)

## 2015-08-12 LAB — CBC
HEMATOCRIT: 29.2 % — AB (ref 39.0–52.0)
Hemoglobin: 8.9 g/dL — ABNORMAL LOW (ref 13.0–17.0)
MCH: 30 pg (ref 26.0–34.0)
MCHC: 30.5 g/dL (ref 30.0–36.0)
MCV: 98.3 fL (ref 78.0–100.0)
Platelets: 186 10*3/uL (ref 150–400)
RBC: 2.97 MIL/uL — ABNORMAL LOW (ref 4.22–5.81)
RDW: 16.5 % — AB (ref 11.5–15.5)
WBC: 7 10*3/uL (ref 4.0–10.5)

## 2015-08-12 LAB — GLUCOSE, CAPILLARY
GLUCOSE-CAPILLARY: 391 mg/dL — AB (ref 65–99)
Glucose-Capillary: 315 mg/dL — ABNORMAL HIGH (ref 65–99)
Glucose-Capillary: 302 mg/dL — ABNORMAL HIGH (ref 65–99)
Glucose-Capillary: 281 mg/dL — ABNORMAL HIGH (ref 65–99)
Glucose-Capillary: 311 mg/dL — ABNORMAL HIGH (ref 65–99)

## 2015-08-12 LAB — BRAIN NATRIURETIC PEPTIDE: B Natriuretic Peptide: 222.8 pg/mL — ABNORMAL HIGH (ref 0.0–100.0)

## 2015-08-12 LAB — URINE CULTURE: Culture: NO GROWTH

## 2015-08-12 LAB — CALCIUM, IONIZED: Calcium, Ionized, Serum: 4.8 mg/dL (ref 4.5–5.6)

## 2015-08-12 LAB — T3: T3, Total: 62 ng/dL — ABNORMAL LOW (ref 71–180)

## 2015-08-12 LAB — PROTIME-INR
INR: 3.73 — ABNORMAL HIGH (ref 0.00–1.49)
Prothrombin Time: 36.1 seconds — ABNORMAL HIGH (ref 11.6–15.2)

## 2015-08-12 LAB — T3, FREE: T3, Free: 1.7 pg/mL — ABNORMAL LOW (ref 2.0–4.4)

## 2015-08-12 MED ORDER — ATORVASTATIN CALCIUM 20 MG PO TABS
20.0000 mg | ORAL_TABLET | Freq: Every day | ORAL | Status: DC
  Administered 2015-08-12 – 2015-08-17 (×6): 20 mg via ORAL
  Filled 2015-08-12 (×6): qty 1

## 2015-08-12 MED ORDER — FERROUS SULFATE 325 (65 FE) MG PO TABS
325.0000 mg | ORAL_TABLET | Freq: Three times a day (TID) | ORAL | Status: DC
  Administered 2015-08-12 – 2015-08-17 (×17): 325 mg via ORAL
  Filled 2015-08-12 (×17): qty 1

## 2015-08-12 MED ORDER — DOCUSATE SODIUM 100 MG PO CAPS
100.0000 mg | ORAL_CAPSULE | Freq: Two times a day (BID) | ORAL | Status: DC
  Administered 2015-08-12 – 2015-08-14 (×5): 100 mg via ORAL
  Filled 2015-08-12 (×6): qty 1

## 2015-08-12 MED ORDER — WARFARIN - PHARMACIST DOSING INPATIENT: Freq: Every day | Status: DC

## 2015-08-12 MED ORDER — ALBUTEROL SULFATE (2.5 MG/3ML) 0.083% IN NEBU
3.0000 mL | INHALATION_SOLUTION | Freq: Two times a day (BID) | RESPIRATORY_TRACT | Status: DC
  Administered 2015-08-13 – 2015-08-16 (×8): 3 mL via RESPIRATORY_TRACT
  Filled 2015-08-12 (×9): qty 3

## 2015-08-12 MED ORDER — FLUCONAZOLE 100 MG PO TABS
100.0000 mg | ORAL_TABLET | Freq: Every day | ORAL | Status: DC
  Administered 2015-08-12 – 2015-08-16 (×5): 100 mg via ORAL
  Filled 2015-08-12 (×5): qty 1

## 2015-08-12 MED ORDER — FUROSEMIDE 20 MG PO TABS
20.0000 mg | ORAL_TABLET | Freq: Every day | ORAL | Status: DC
  Administered 2015-08-12: 20 mg via ORAL
  Filled 2015-08-12: qty 1

## 2015-08-12 MED ORDER — HYDROCORTISONE NA SUCCINATE PF 100 MG IJ SOLR
50.0000 mg | Freq: Two times a day (BID) | INTRAMUSCULAR | Status: DC
  Administered 2015-08-12 – 2015-08-13 (×2): 50 mg via INTRAVENOUS
  Filled 2015-08-12 (×2): qty 2

## 2015-08-12 MED ORDER — ACETAMINOPHEN 325 MG PO TABS: 650.0000 mg | ORAL_TABLET | Freq: Four times a day (QID) | ORAL | Status: DC | PRN

## 2015-08-12 NOTE — Progress Notes (Signed)
Spoke with md about critical Blood sugar from IV lab draw. No action needed at this time. Will continue to monitor.   Larya Charpentier, Mervin Kung RN

## 2015-08-12 NOTE — Evaluation (Signed)
Physical Therapy Evaluation Patient Details Name: Curtis Frazier MRN: [id number] DOB: 11-Dec-1940 Today's Date: 08/12/2015   History of Present Illness  Patient is a 75 y/o male with hx of lumbar surgery on 2/2, HIV, psoriatic arthritis, scoliosis, HLD, HTN, MI, RA, PMR kidney stones and DM who presents from [facility] after a seven-day stay due to encephalopathy, acute on chronic renal failure and hypernatremia. Patient's altered mental status is suspicious for metabolic derangement vs infectious versus medication induced etiologies. Workup pending.  Clinical Impression  Patient presents with decreased strength, balance, cognition, endurance, activity tolerance and dyspnea on exertion impacting safe mobility. Tolerated standing x2 and taking a few steps to the chair with LLE buckling. Highly motivated to regain mobility/strength. Pt with supportive family. Encouraged OOB to chair as much as tolerated. Recommend CIR to maximize independence and mobility prior to return home with support from family. Will follow acutely.     Follow Up Recommendations CIR    Equipment Recommendations  Other (comment) (TBD)    Recommendations for Other Services OT consult;Rehab consult     Precautions / Restrictions Precautions Precautions: Fall;Back Precaution Comments: Recent spine surgery 2/2. Restrictions Weight Bearing Restrictions: No      Mobility  Bed Mobility Overal bed mobility: Needs Assistance Bed Mobility: Rolling;Sidelying to Sit Rolling: Min guard Sidelying to sit: Mod assist;HOB elevated       General bed mobility comments: Good demo of log roll technique- cues for technique. Mod A to elevate trunk. not able to perform without assist.   Transfers Overall transfer level: Needs assistance Equipment used: Rolling walker (2 wheeled)   Sit to Stand: Mod assist;+2 physical assistance         General transfer comment: Assist of 2 to boost from EOB with cues for hand placement/technique.  Pt with heavy posterior bias in standing despite cues for anterior weight shift- anxious? fear of falling? Stood x2 from EOB. Transferred to chair with Left knee buckling. 2/4 DOE.  Ambulation/Gait Ambulation/Gait assistance: Mod assist;+2 physical assistance Ambulation Distance (Feet): 3 Feet Assistive device: Rolling walker (2 wheeled) Gait Pattern/deviations: Shuffle;Step-to pattern;Decreased stride length;Decreased step length - right;Decreased step length - left Gait velocity: decreased   General Gait Details: Able to take a few steps forward and towards the chair with bil knee weakness noted and left knee buckling. Assist of 2 for safety. Posterior lean.   Stairs            Wheelchair Mobility    Modified Rankin (Stroke Patients Only)       Balance Overall balance assessment: Needs assistance Sitting-balance support: Feet supported;Bilateral upper extremity supported Sitting balance-Leahy Scale: Poor Sitting balance - Comments: Pt with heavy posterior bias and lean, Min guard for safety. Eventually holding onto RW handles for support to maintain sitting balance. Cues for forward lean to activate core musculature to assist with sitting balance.  Postural control: Posterior lean Standing balance support: During functional activity;Bilateral upper extremity supported Standing balance-Leahy Scale: Poor Standing balance comment: Able to stand with assist of 2 for safety and BUe support on RW.  Bil knee instability noted. Stood for ~35 sec.                             Pertinent Vitals/Pain Pain Assessment: No/denies pain    Home Living Family/patient expects to be discharged to:: Inpatient rehab Living Arrangements: Spouse/significant other Available Help at Discharge: Family;Available PRN/intermittently Type of Home: House Home Access: Stairs to  enter Entrance Stairs-Rails: None Entrance Stairs-Number of Steps: 1 Home Layout: One level Home Equipment:  [place] - 2 wheels;Bedside commode      Prior Function Level of Independence: Independent         Comments: prior to back surgery on 2/2, pt drives tractor trailers, completely independent. At [facility] (prior to admission to hospital), pt was barely standing with assist and transferring to chair.      Hand Dominance   Dominant Hand: Right    Extremity/Trunk Assessment   Upper Extremity Assessment: Defer to OT evaluation           Lower Extremity Assessment: LLE deficits/detail;Generalized weakness   LLE Deficits / Details: Functional weakness in LLE as noted with knee buckling during transfer/taking a few steps.   Cervical / Trunk Assessment: Kyphotic  Communication   Communication: No difficulties  Cognition Arousal/Alertness: Awake/alert Behavior During Therapy: WFL for tasks assessed/performed Overall Cognitive Status: Within Functional Limits for tasks assessed (Family states mentation is closing on baseline but still some minor confusion- much improved. ) Area of Impairment: Orientation (Able to state "hospital in [place]" and correct date, month and year. )                    General Comments General comments (skin integrity, edema, etc.): Pt's son and daughter present in room during session.    Exercises        Assessment/Plan    PT Assessment Patient needs continued PT services  PT Diagnosis Difficulty walking;Generalized weakness   PT Problem List Decreased strength;Cardiopulmonary status limiting activity;Decreased activity tolerance;Decreased balance;Decreased mobility;Decreased knowledge of precautions;Decreased cognition  PT Treatment Interventions Balance training;Gait training;Functional mobility training;Therapeutic activities;Therapeutic exercise;Patient/family education;Stair training;DME instruction   PT Goals (Current goals can be found in the Care Plan section) Acute Rehab PT Goals Patient Stated Goal: to return to independence PT  Goal Formulation: With patient Time For Goal Achievement: 08/26/15 Potential to Achieve Goals: Good    Frequency Min 3X/week   Barriers to discharge        Co-evaluation PT/OT/SLP Co-Evaluation/Treatment: Yes Reason for Co-Treatment: For patient/therapist safety PT goals addressed during session: Mobility/safety with mobility;Strengthening/ROM;Proper use of DME;Balance         End of Session Equipment Utilized During Treatment: Gait belt;Oxygen Activity Tolerance: Patient tolerated treatment well Patient left: in chair;with call bell/phone within reach;with chair alarm set;with family/visitor present;Other (comment) (MD present) Nurse Communication: Mobility status;Other (comment) (transfer technique.)         Time: [id number] PT Time Calculation (min) (ACUTE ONLY): 29 min   Charges:   PT Evaluation $PT Eval Moderate Complexity: 1 Procedure     PT G Codes:        Caide Campi A Ainsley Deakins 08/12/2015, 11:31 AM  Wray Kearns, PT, DPT [phone]

## 2015-08-12 NOTE — Progress Notes (Signed)
Occupational Therapy Evaluation Patient Details Name: Curtis Frazier MRN: [id number] DOB: Jul 10, 1940 Today's Date: 08/12/2015    History of Present Illness Patient is a 75 y/o male with hx of lumbar surgery on 2/2, HIV, psoriatic arthritis, scoliosis, HLD, HTN, MI, RA, PMR kidney stones and DM who presents from [facility] after a seven-day stay due to encephalopathy, acute on chronic renal failure and hypernatremia. Patient's altered mental status is suspicious for metabolic derangement vs infectious versus medication induced etiologies. Workup pending.   Clinical Impression   Pt admitted with the above diagnoses and presents with below problem list. Pt will benefit from continued acute OT to address the below listed deficits and maximize independence with BADLs prior to d/c to venue below. Prior to recent hospitalizations pt was independent with ADLs. Pt is currently mod +2 physical assist with functional transfers.Pt motivated to participate in therapy and has good family support. OT to continue to follow acutely.      Follow Up Recommendations  CIR    Equipment Recommendations  Other (comment) (defer to next venue)    Recommendations for Other Services Rehab consult     Precautions / Restrictions Precautions Precautions: Fall;Back Precaution Comments: Recent spine surgery 2/2. Restrictions Weight Bearing Restrictions: No      Mobility Bed Mobility Overal bed mobility: Needs Assistance Bed Mobility: Rolling;Sidelying to Sit Rolling: Min guard Sidelying to sit: Mod assist;HOB elevated       General bed mobility comments: Good demo of log roll technique- cues for technique. Mod A to elevate trunk. not able to perform without assist.   Transfers Overall transfer level: Needs assistance Equipment used: Rolling walker (2 wheeled) Transfers: Sit to/from [facility] Sit to Stand: Mod assist;+2 physical assistance Stand pivot transfers: Mod assist;+2 physical  assistance;From elevated surface       General transfer comment: Assist of 2 to boost from EOB with cues for hand placement/technique. Pt with heavy posterior bias in standing despite cues for anterior weight shift- anxious? fear of falling? Stood x2 from EOB. Transferred to chair with Left knee buckling. 2/4 DOE.    Balance Overall balance assessment: Needs assistance Sitting-balance support: Bilateral upper extremity supported;Feet supported Sitting balance-Leahy Scale: Poor Sitting balance - Comments: Pt with heavy posterior bias and lean, Min guard for safety. Eventually holding onto RW handles for support to maintain sitting balance. Cues for forward lean to activate core musculature to assist with sitting balance.  Postural control: Posterior lean Standing balance support: Bilateral upper extremity supported;During functional activity Standing balance-Leahy Scale: Poor Standing balance comment: Able to stand with assist of 2 for safety and BUe support on RW. Bil knee instability noted. Stood for ~35 sec.                            ADL Overall ADL's : Needs assistance/impaired Eating/Feeding: Set up;Sitting Eating/Feeding Details (indicate cue type and reason): UE weakness noted Grooming: Sitting;Set up   Upper Body Bathing: Minimal assitance;Sitting Upper Body Bathing Details (indicate cue type and reason): min A due to UE weakness and decreased trunk strength Lower Body Bathing: Moderate assistance;+2 for physical assistance;Sit to/from stand   Upper Body Dressing : Minimal assistance;Sitting Upper Body Dressing Details (indicate cue type and reason): min A due to UE weakness and decreased trunk strength Lower Body Dressing: Moderate assistance;+2 for physical assistance   Toilet Transfer: Moderate assistance;Stand-pivot;BSC;RW;+2 for physical assistance   Toileting- Clothing Manipulation and Hygiene: Moderate assistance;+2 for physical  assistance;Sit to/from  stand   Tub/ Shower Transfer: Stand-pivot;Moderate assistance;+2 for physical assistance;3 in 1;Rolling walker     General ADL Comments: Pt completed SPT from EOB to recliner with mod +2 assist. Pt with weakness noted in BUE and trunk. Pt with posterior and at times right lateral lean.      Vision     Perception     Praxis      Pertinent Vitals/Pain Pain Assessment: No/denies pain     Hand Dominance Right   Extremity/Trunk Assessment Upper Extremity Assessment Upper Extremity Assessment: Generalized weakness (unable to hold BUE in 90 degrees shoulder flex against gravi)   Lower Extremity Assessment Lower Extremity Assessment: LLE deficits/detail;Generalized weakness LLE Deficits / Details: Functional weakness in LLE as noted with knee buckling during transfer/taking a few steps.    Cervical / Trunk Assessment Cervical / Trunk Assessment: Kyphotic   Communication Communication Communication: No difficulties   Cognition Arousal/Alertness: Awake/alert Behavior During Therapy: WFL for tasks assessed/performed Overall Cognitive Status: Within Functional Limits for tasks assessed (Family states mentation is closing on baseline but still som) Area of Impairment: Orientation (Able to state "hospital in [place]" and correct date, mon)                   General Comments       Exercises       Shoulder Instructions      Home Living Family/patient expects to be discharged to:: Inpatient rehab Living Arrangements: Spouse/significant other Available Help at Discharge: Family;Available PRN/intermittently Type of Home: House Home Access: Stairs to enter [employer] of Steps: 1 Entrance Stairs-Rails: None Home Layout: One level     Bathroom Shower/Tub: Walk-in shower;Door   [employer] Toilet: Standard Bathroom Accessibility: Yes   Home Equipment: Environmental consultant - 2 wheels;Bedside commode          Prior Functioning/Environment Level of Independence:  Independent        Comments: prior to back surgery on 2/2, pt drives tractor trailers, completely independent. At [facility] (prior to admission to hospital), pt was barely standing with assist and transferring to chair.     OT Diagnosis: Generalized weakness   OT Problem List: Decreased strength;Decreased activity tolerance;Impaired balance (sitting and/or standing);Decreased cognition;Decreased knowledge of use of DME or AE;Decreased knowledge of precautions;Cardiopulmonary status limiting activity;Impaired UE functional use   OT Treatment/Interventions: Self-care/ADL training;Patient/family education;Balance training;Therapeutic activities;DME and/or AE instruction;Cognitive remediation/compensation;Therapeutic exercise;Energy conservation    OT Goals(Current goals can be found in the care plan section) Acute Rehab OT Goals Patient Stated Goal: to return to independence OT Goal Formulation: With patient Time For Goal Achievement: 08/26/15 Potential to Achieve Goals: Good ADL Goals Pt Will Perform Grooming: sitting;with supervision Pt Will Perform Upper Body Bathing: with supervision;sitting Pt Will Perform Lower Body Bathing: with min assist;sit to/from stand Pt Will Perform Upper Body Dressing: with supervision;sitting Pt Will Perform Lower Body Dressing: with min assist;sit to/from stand;with adaptive equipment Pt Will Transfer to Toilet: with mod assist;ambulating;bedside commode Pt Will Perform Toileting - Clothing Manipulation and hygiene: with min assist;sit to/from stand  OT Frequency: Min 2X/week   Barriers to D/C:            Co-evaluation PT/OT/SLP Co-Evaluation/Treatment: Yes Reason for Co-Treatment: For patient/therapist safety PT goals addressed during session: Mobility/safety with mobility;Strengthening/ROM;Proper use of DME;Balance OT goals addressed during session: ADL's and self-care      End of Session Equipment Utilized During Treatment: Gait  belt;Oxygen Nurse Communication: Mobility status  Activity Tolerance: Patient tolerated treatment  well Patient left: in chair;with call bell/phone within reach;with family/visitor present;with chair alarm set   Time: [phone] OT Time Calculation (min): 29 min Charges:  OT General Charges $OT Visit: 1 Procedure OT Evaluation $OT Eval Moderate Complexity: 1 Procedure G-Codes:    Hortencia Pilar 2015-08-28, 1:27 PM

## 2015-08-12 NOTE — Consult Note (Signed)
Physical Medicine and Rehabilitation Consult  Reason for Consult: Debility due to multiple medical issue after lumbar disectomy Referring Physician: Dr. Conley Canal.    HPI: Curtis Frazier is a 75 y.o. male with history of CAD, psoriatic arthropathy, DMT2, RA, lumbar stenosis with outpatient lumbar discectomy on 07/15/15 who was admitted on 02/4 with sepsis due to PNA, mental status changes and A fib with RVR.  He had VDRF, acute on chronic CHF,cardioversion for management of A fib, issues with encephalopathy and required transfer to [facility] on 02/21 due to high oxygen needs.   He was re-admitted to acute hospital on 02/28 with persistent encephalopathy, reports of poor po intake X few days with acute on chronic renal failure, hypernateremia and lethargy. He was started on IV antibiotics as well as IVF due to hypotension/sepsis. Neurology consulted for input on confusion and asterixis.  EEG done revealing mild slowing but no seizures.  Mentation improving and MRI brain pending. FEES done and patient with moderate pharyngeal dysphagia with mild dyspnea--dysphagia 1, honey liquids by spoon recommended.  PT evaluation done today and CIR recommended due to deconditioned state.   Prior to his lumbar discectomy he was having back pain but he was independent with all his bathing dressing grooming and toileting. He has 6 children, 5 of them live in this area and would be available to assist post discharge  Review of Systems  HENT:       Swallowing problems  Eyes: Negative.   Respiratory: Negative.   Cardiovascular: Negative.   Gastrointestinal: Negative.   Genitourinary: Negative.   Musculoskeletal: Positive for back pain.  Skin: Negative.   Neurological: Positive for weakness.  Endo/Heme/Allergies: Negative.   Psychiatric/Behavioral: Positive for memory loss.    Past Medical History  Diagnosis Date  . Other and unspecified hyperlipidemia   . Coronary atherosclerosis of unspecified type of  vessel, native or graft   . Personal history of colonic polyps   . Actinic keratosis   . Psoriatic arthropathy ([place])   . Other psoriasis   . Scoliosis (and kyphoscoliosis), idiopathic   . GI bleed 12/10    cecam AVM  . Gastritis 12/10  . Hyperlipidemia     takes Pravastatin daily  . Hypertension     takes Metoprolol daily  . Shortness of breath     with exertion  . Headache(784.0)     occasionally  . Joint pain   . Joint swelling   . Chronic back pain     scoliosis/stenosis/radiculopathy,degenerative disc disease  . Psoriasis   . Bruises easily   . Esophageal reflux     takes Omeprazole daily  . History of colonic polyps   . Hemorrhoids   . Kidney stone     hx of  . Blood transfusion 2010  . PMR (polymyalgia rheumatica) ([facility]) 01/20/2012    tx by specialist on a long prednisone taper  . S/P PTCA (percutaneous transluminal coronary angioplasty)   . Rheumatoid arthritis(714.0)     takes Methotrexate 7pills weekly  . Degeneration of intervertebral disc, site unspecified   . Myocardial infarction ([place]) 2010  . Pneumonia 2008; 07/2015  . Type II or unspecified type diabetes mellitus without mention of complication, not stated as uncontrolled   . Skin cancer     "arms"    Past Surgical History  Procedure Laterality Date  . Rotator cuff repair Left 2000  . Colonoscopy    . Vasectomy  1979  . Coronary angioplasty with stent placement  02/2009    has one stent  . Carotid doppler  10/12    0-39% R and 60-79% left   . Cataract extraction  2012  . Cardiac catheterization  2010  . Lithotripsy  2009  . Tee without cardioversion N/A 08/06/2015    Procedure: TRANSESOPHAGEAL ECHOCARDIOGRAM (TEE);  Surgeon: Skeet Latch, MD;  Location: [place];  Service: Cardiovascular;  Laterality: N/A;  . Cardioversion N/A 08/06/2015    Procedure: CARDIOVERSION;  Surgeon: Skeet Latch, MD;  Location[facility] ENDOSCOPY;  Service: Cardiovascular;  Laterality: N/A;  . Back surgery    .  Lumbar laminectomy  07/2015  . Posterior laminectomy thoracic spine      "fixed the discs"  . Skin cancer excision      "arms"    Family History  Problem Relation Age of Onset  . Coronary artery disease Mother   . Diabetes Mother   . CAD Mother   . Coronary artery disease Sister   . Hypertension Sister   . Kidney failure Sister   . CAD Sister   . Diabetes Brother   . Prostate cancer Brother   . Pancreatic cancer Brother   . Diabetes Brother   . Anesthesia problems Neg Hx   . Hypotension Neg Hx   . Malignant hyperthermia Neg Hx   . Pseudochol deficiency Neg Hx     Social History:  reports that he has quit smoking. His smoking use included Cigarettes. He has a 79.5 pack-year smoking history. He has never used smokeless tobacco. He reports that he drinks alcohol. He reports that he does not use illicit drugs.     Allergies  Allergen Reactions  . Ace Inhibitors Other (See Comments)    Causes high K  . Angiotensin Receptor Blockers Other (See Comments)    Causes high K  . Metoprolol Other (See Comments)    May cause elevated K level  . Penicillins Swelling and Rash  . Tetracycline Swelling and Rash   Medications Prior to Admission  Medication Sig Dispense Refill  . acetaminophen (TYLENOL) 325 MG tablet Take 650 mg by mouth every 6 (six) hours as needed (Pain scale of 1-4 or temp > 101).    Marland Kitchen albuterol (PROVENTIL) (2.5 MG/3ML) 0.083% nebulizer solution Take 2.5 mg by nebulization 2 (two) times daily.    Marland Kitchen atorvastatin (LIPITOR) 20 MG tablet Take 20 mg by mouth daily at 6 PM.    . calcium-vitamin D (OSCAL WITH D) 500-200 MG-UNIT tablet Take 1 tablet by mouth daily with breakfast.    . docusate sodium (COLACE) 100 MG capsule Take 100 mg by mouth 2 (two) times daily.    . ferrous sulfate 325 (65 FE) MG tablet Take 325 mg by mouth 3 (three) times daily with meals.    . furosemide (LASIX) 20 MG tablet Take 20 mg by mouth daily.    Marland Kitchen glipiZIDE (GLUCOTROL) 5 MG tablet Take by  mouth daily before breakfast.    . magnesium sulfate 2 GM/50ML SOLN infusion Inject into the vein once. If Mg < 0.9 Call MD 0.9-1.1 Give 2g IV over 1 hr x 3 doses 1.2-1.4 Give 2g IV over 1 hr x 2 doses 1.5-1.7 Give 2g IV over 1 hr x 1 dose Repeat Mg level 8 hrs after replacement    . metoprolol (LOPRESSOR) 5 MG/5ML SOLN injection Inject 2.5 mg into the vein every 4 (four) hours as needed (Keep HR < 115).    . metoprolol (LOPRESSOR) 50 MG tablet Take 50 mg  by mouth 2 (two) times daily.    . Multiple Vitamins-Minerals (MULTIVITAMINS THER. W/MINERALS) TABS tablet Take 1 tablet by mouth daily.    . potassium chloride 10 MEQ/50ML Inject 10 mEq into the vein. K < 2.6 give 76mEq Q4H x 3 doses K 2.7-3.0 Give 55mEq Q4H x 2 doses K 3.1-3.4 Give 20mEq x 1 dose K > 3.5 No additional KCl needed    . potassium chloride SA (K-DUR,KLOR-CON) 20 MEQ tablet Take 20 mEq by mouth daily.    . predniSONE (DELTASONE) 10 MG tablet Take 10 mg by mouth daily with breakfast. Last dose of 3/1. Then to taper    . sodium polystyrene (KAYEXALATE) 15 GM/60ML suspension Take 15 g by mouth as needed (For K 5.2 - 5.5 mEq).    . vancomycin 1,000 mg in sodium chloride 0.9 % 250 mL Inject 1,000 mg into the vein daily. Daily at 1300. To stop on 2/28    . warfarin (COUMADIN) 5 MG tablet Take 1 tablet (5 mg total) by mouth daily at 6 PM. (Patient taking differently: Take 5 mg by mouth daily at 6 PM. Dose it not in records. Last known dose was 5mg  daily.)    . acetylcysteine (MUCOMYST) 20 % nebulizer solution Take 4 mLs by nebulization 3 (three) times daily. (Patient not taking: Reported on 08/10/2015) 30 mL 12  . amiodarone (PACERONE) 400 MG tablet Take 1 tablet (400 mg total) by mouth daily. (Patient not taking: Reported on 08/10/2015)    . diltiazem (CARDIZEM) 90 MG tablet Take 1 tablet (90 mg total) by mouth every 6 (six) hours. (Patient not taking: Reported on 08/10/2015)    . furosemide (LASIX) 10 MG/ML injection Inject 4 mLs (40 mg  total) into the vein daily. (Patient not taking: Reported on 08/10/2015) 4 mL 0  . guaiFENesin (ROBITUSSIN) 100 MG/5ML SOLN Take 10 mLs (200 mg total) by mouth every 4 (four) hours. (Patient not taking: Reported on 08/10/2015) 1200 mL 0  . heparin 100-0.45 UNIT/ML-% infusion Inject 1,550 Units/hr into the vein continuous. (Patient not taking: Reported on 08/10/2015) 250 mL   . insulin glargine (LANTUS) 100 UNIT/ML injection Inject 0.1 mLs (10 Units total) into the skin daily. (Patient not taking: Reported on 08/10/2015) 10 mL 11  . levalbuterol (XOPENEX) 0.63 MG/3ML nebulizer solution Take 3 mLs (0.63 mg total) by nebulization every 6 (six) hours. (Patient not taking: Reported on 08/10/2015) 3 mL 12  . meropenem 1 g in sodium chloride 0.9 % 100 mL Inject 1 g into the vein every 12 (twelve) hours. (Patient not taking: Reported on 08/10/2015)    . nitroGLYCERIN (NITROSTAT) 0.4 MG SL tablet Place 1 tablet (0.4 mg total) under the tongue every 5 (five) minutes as needed for chest pain (x 3 doses). (Patient not taking: Reported on 08/10/2015) 25 tablet 3  . pantoprazole sodium (PROTONIX) 40 mg/20 mL PACK Take 20 mLs (40 mg total) by mouth daily. (Patient not taking: Reported on 08/10/2015) 30 each   . pravastatin (PRAVACHOL) 20 MG tablet TAKE 1 TABLET BY MOUTH DAILY (Patient not taking: Reported on 08/10/2015) 30 tablet 5  . predniSONE (DELTASONE) 10 MG tablet Take 5 tablets (50 mg total) by mouth daily. (Patient not taking: Reported on 08/10/2015)    . Vancomycin (VANCOCIN) 750 MG/150ML SOLN Inject 150 mLs (750 mg total) into the vein daily. (Patient not taking: Reported on 08/10/2015) 4000 mL   . [DISCONTINUED] Calcium Carbonate-Vit D-Min (CALCIUM 1200 PO) Take 1 tablet by mouth daily.     . [  DISCONTINUED] Cholecalciferol 1000 UNITS capsule Take 1,000 Units by mouth daily.      . [DISCONTINUED] cyclobenzaprine (FLEXERIL) 10 MG tablet Take 10 mg by mouth 3 (three) times daily as needed for muscle spasms.    .  [DISCONTINUED] fish oil-omega-3 fatty acids 1000 MG capsule Take 1 g by mouth daily.     . [DISCONTINUED] folic acid (FOLVITE) 1 MG tablet Take 1 mg by mouth daily.     . [DISCONTINUED] InFLIXimab (REMICADE IV) Inject 100 mg into the vein See admin instructions. Takes every 2 months    . [DISCONTINUED] VOLTAREN 1 % GEL Takes 1 application topically daily as needed for pain  0    Home: Home Living Family/patient expects to be discharged to:: Inpatient rehab Living Arrangements: Spouse/significant other Available Help at Discharge: Family, Available PRN/intermittently Type of Home: House Home Access: Stairs to enter [employer] of Steps: 1 Entrance Stairs-Rails: None Home Layout: One level Bathroom Shower/Tub: Gaffer, Charity fundraiser: Standard Bathroom Accessibility: Yes Home Equipment: Environmental consultant - 2 wheels, Bedside commode  Functional History: Prior Function Level of Independence: Independent Comments: prior to back surgery on 2/2, pt drives tractor trailers, completely independent. At [facility] (prior to admission to hospital), pt was barely standing with assist and transferring to chair.  Functional Status:  Mobility: Bed Mobility Overal bed mobility: Needs Assistance Bed Mobility: Rolling, Sidelying to Sit Rolling: Min guard Sidelying to sit: Mod assist, HOB elevated General bed mobility comments: Good demo of log roll technique- cues for technique. Mod A to elevate trunk. not able to perform without assist.  Transfers Overall transfer level: Needs assistance Equipment used: Rolling walker (2 wheeled) Sit to Stand: Mod assist, +2 physical assistance General transfer comment: Assist of 2 to boost from EOB with cues for hand placement/technique. Pt with heavy posterior bias in standing despite cues for anterior weight shift- anxious? fear of falling? Stood x2 from EOB. Transferred to chair with Left knee buckling. 2/4 DOE. Ambulation/Gait Ambulation/Gait  assistance: Mod assist, +2 physical assistance Ambulation Distance (Feet): 3 Feet Assistive device: Rolling walker (2 wheeled) Gait Pattern/deviations: Shuffle, Step-to pattern, Decreased stride length, Decreased step length - right, Decreased step length - left General Gait Details: Able to take a few steps forward and towards the chair with bil knee weakness noted and left knee buckling. Assist of 2 for safety. Posterior lean.  Gait velocity: decreased    ADL:    Cognition: Cognition Overall Cognitive Status: Within Functional Limits for tasks assessed (Family states mentation is closing on baseline but still some minor confusion- much improved. ) Orientation Level: Oriented to person, Disoriented to place, Disoriented to time, Disoriented to situation Cognition Arousal/Alertness: Awake/alert Behavior During Therapy: WFL for tasks assessed/performed Overall Cognitive Status: Within Functional Limits for tasks assessed (Family states mentation is closing on baseline but still some minor confusion- much improved. ) Area of Impairment: Orientation (Able to state "hospital in [place]" and correct date, month and year. )  Blood pressure 109/52, pulse 97, temperature 97 F (36.1 C), temperature source Oral, resp. rate 20, height 5\' 8"  (1.727 m), weight 77.8 kg (171 lb 8.3 oz), SpO2 98 %. Physical Exam  Nursing note and vitals reviewed. Constitutional: He appears well-developed and well-nourished.  HENT:  Head: Normocephalic and atraumatic.  Eyes: Conjunctivae and EOM are normal. Pupils are equal, round, and reactive to light.  Neck: Normal range of motion.  Cardiovascular: Normal rate, regular rhythm, S1 normal and S2 normal.   Murmur heard.  Systolic murmur  is present with a grade of 3/6  Respiratory: Effort normal and breath sounds normal. No respiratory distress. He has no rales.  GI: Soft. Bowel sounds are normal. He exhibits no distension. There is no tenderness. There is no  rebound and no guarding.  Neurological: He is alert. He displays atrophy. No sensory deficit. He exhibits normal muscle tone. Gait abnormal.  Motor strength is 4+/5 bilateral deltoids, biceps, triceps, grip 4 minus/5 bilateral hip flexor and knee extensor and ankle dorsiflexor and plantar flexor Bilateral calf atrophy  Skin: Skin is dry. He is not diaphoretic.  Psychiatric: His affect is blunt. His speech is delayed. He is slowed.  Oriented to person and place but not time  Results for orders placed or performed during the hospital encounter of 08/10/15 (from the past 24 hour(s))  Urinalysis, Routine w reflex microscopic (not at [facility])     Status: None   Collection Time: 08/11/15  2:06 PM  Result Value Ref Range   Color, Urine YELLOW YELLOW   APPearance CLEAR CLEAR   Specific Gravity, Urine 1.012 1.005 - 1.030   pH 5.5 5.0 - 8.0   Glucose, UA NEGATIVE NEGATIVE mg/dL   Hgb urine dipstick NEGATIVE NEGATIVE   Bilirubin Urine NEGATIVE NEGATIVE   Ketones, ur NEGATIVE NEGATIVE mg/dL   Protein, ur NEGATIVE NEGATIVE mg/dL   Nitrite NEGATIVE NEGATIVE   Leukocytes, UA NEGATIVE NEGATIVE  Lactic acid, plasma     Status: None   Collection Time: 08/11/15  2:40 PM  Result Value Ref Range   Lactic Acid, Venous 2.0 0.5 - 2.0 mmol/L  Basic metabolic panel     Status: Abnormal   Collection Time: 08/11/15  2:40 PM  Result Value Ref Range   Sodium 150 (H) 135 - 145 mmol/L   Potassium 4.1 3.5 - 5.1 mmol/L   Chloride 115 (H) 101 - 111 mmol/L   CO2 24 22 - 32 mmol/L   Glucose, Bld 236 (H) 65 - 99 mg/dL   BUN 44 (H) 6 - 20 mg/dL   Creatinine, Ser 2.12 (H) 0.61 - 1.24 mg/dL   Calcium 7.9 (L) 8.9 - 10.3 mg/dL   GFR calc non Af Amer 29 (L) >60 mL/min   GFR calc Af Amer 33 (L) >60 mL/min   Anion gap 11 5 - 15  CBC     Status: Abnormal   Collection Time: 08/11/15  2:40 PM  Result Value Ref Range   WBC 9.4 4.0 - 10.5 K/uL   RBC 3.55 (L) 4.22 - 5.81 MIL/uL   Hemoglobin 10.5 (L) 13.0 - 17.0 g/dL   HCT  35.4 (L) 39.0 - 52.0 %   MCV 99.7 78.0 - 100.0 fL   MCH 29.6 26.0 - 34.0 pg   MCHC 29.7 (L) 30.0 - 36.0 g/dL   RDW 16.5 (H) 11.5 - 15.5 %   Platelets 216 150 - 400 K/uL  Magnesium     Status: None   Collection Time: 08/11/15  2:40 PM  Result Value Ref Range   Magnesium 1.8 1.7 - 2.4 mg/dL  Troponin I (q 6hr x 3)     Status: Abnormal   Collection Time: 08/11/15  2:40 PM  Result Value Ref Range   Troponin I 0.05 (H) <0.031 ng/mL  Glucose, capillary     Status: Abnormal   Collection Time: 08/11/15  3:48 PM  Result Value Ref Range   Glucose-Capillary 217 (H) 65 - 99 mg/dL   Comment 1 Notify RN    Comment 2  Document in Chart   Lactic acid, plasma     Status: None   Collection Time: 08/11/15  5:10 PM  Result Value Ref Range   Lactic Acid, Venous 1.4 0.5 - 2.0 mmol/L  Glucose, capillary     Status: Abnormal   Collection Time: 08/11/15  9:26 PM  Result Value Ref Range   Glucose-Capillary 271 (H) 65 - 99 mg/dL  Protime-INR     Status: Abnormal   Collection Time: 08/12/15  4:33 AM  Result Value Ref Range   Prothrombin Time 36.1 (H) 11.6 - 15.2 seconds   INR 3.73 (H) 0.00 - 1.49  CBC     Status: Abnormal   Collection Time: 08/12/15  4:33 AM  Result Value Ref Range   WBC 7.0 4.0 - 10.5 K/uL   RBC 2.97 (L) 4.22 - 5.81 MIL/uL   Hemoglobin 8.9 (L) 13.0 - 17.0 g/dL   HCT 29.2 (L) 39.0 - 52.0 %   MCV 98.3 78.0 - 100.0 fL   MCH 30.0 26.0 - 34.0 pg   MCHC 30.5 30.0 - 36.0 g/dL   RDW 16.5 (H) 11.5 - 15.5 %   Platelets 186 150 - 400 K/uL  Glucose, capillary     Status: Abnormal   Collection Time: 08/12/15  6:16 AM  Result Value Ref Range   Glucose-Capillary 315 (H) 65 - 99 mg/dL  Basic metabolic panel     Status: Abnormal   Collection Time: 08/12/15  6:35 AM  Result Value Ref Range   Sodium 135 135 - 145 mmol/L   Potassium 3.7 3.5 - 5.1 mmol/L   Chloride 105 101 - 111 mmol/L   CO2 24 22 - 32 mmol/L   Glucose, Bld 582 (HH) 65 - 99 mg/dL   BUN 38 (H) 6 - 20 mg/dL   Creatinine, Ser  1.65 (H) 0.61 - 1.24 mg/dL   Calcium 7.2 (L) 8.9 - 10.3 mg/dL   GFR calc non Af Amer 39 (L) >60 mL/min   GFR calc Af Amer 45 (L) >60 mL/min   Anion gap 6 5 - 15  Glucose, capillary     Status: Abnormal   Collection Time: 08/12/15  7:49 AM  Result Value Ref Range   Glucose-Capillary 302 (H) 65 - 99 mg/dL  Brain natriuretic peptide     Status: Abnormal   Collection Time: 08/12/15 10:40 AM  Result Value Ref Range   B Natriuretic Peptide 222.8 (H) 0.0 - 100.0 pg/mL   Portable Chest 1 View  08/10/2015  CLINICAL DATA:  Shortness of breath EXAM: PORTABLE CHEST 1 VIEW COMPARISON:  08/05/2015 FINDINGS: Low lung volumes with bibasilar atelectasis, worse on the left. Stable cardiomegaly without edema or effusion. No pneumothorax. Right PICC line tip mid SVC level. Residual scoliosis of the spine. Thoracolumbar fusion hardware partially imaged. Trachea is midline. IMPRESSION: Cardiomegaly with basilar atelectasis, worse on the left. Electronically Signed   By: Jerilynn Mages.  Shick M.D.   On: 08/10/2015 18:35    Assessment/Plan: Diagnosis:Encephalopathy and Deconditioning after bending dependent respiratory failure resulting from pneumonia and sepsis 1. Does the need for close, 24 hr/day medical supervision in concert with the patient's rehab needs make it unreasonable for this patient to be served in a less intensive setting? Yes 2. Co-Morbidities requiring supervision/potential complications: Dysphonic, history of coronary artery disease, history of psoriatic arthritis, atrial fibrillation, history of CHF, encephalopathy, Dysphagia 3. Due to bladder management, bowel management, safety, skin/wound care, disease management, medication administration, pain management and patient education, does the patient  require 24 hr/day rehab nursing? Yes 4. Does the patient require coordinated care of a physician, rehab nurse, PT (1-2 hrs/day, 5 days/week), OT (1-2 hrs/day, 5 days/week) and SLP (0.5-1 hrs/day, 5 days/week) to  address physical and functional deficits in the context of the above medical diagnosis(es)? Yes Addressing deficits in the following areas: balance, endurance, locomotion, strength, transferring, bowel/bladder control, bathing, dressing, feeding, grooming, toileting, cognition and swallowing 5. Can the patient actively participate in an intensive therapy program of at least 3 hrs of therapy per day at least 5 days per week? Yes 6. The potential for patient to make measurable gains while on inpatient rehab is good 7. Anticipated functional outcomes upon discharge from inpatient rehab are modified independent and supervision  with PT, modified independent and supervision with OT, modified independent and supervision with SLP. 8. Estimated rehab length of stay to reach the above functional goals is: 18-22 days 9. Does the patient have adequate social supports and living environment to accommodate these discharge functional goals? Yes 10. Anticipated D/C setting: Home 11. Anticipated post D/C treatments: [place] therapy 12. Overall Rehab/Functional Prognosis: good  RECOMMENDATIONS: This patient's condition is appropriate for continued rehabilitative care in the following setting: CIR Patient has agreed to participate in recommended program. Yes Note that insurance prior authorization may be required for reimbursement for recommended care.  Comment:     08/12/2015

## 2015-08-12 NOTE — Progress Notes (Signed)
Inpatient Diabetes Program Recommendations  AACE/ADA: New Consensus Statement on Inpatient Glycemic Control (2015)  Target Ranges:  Prepandial:   less than 140 mg/dL      Peak postprandial:   less than 180 mg/dL (1-2 hours)      Critically ill patients:  140 - 180 mg/dL   Review of Glycemic Control  Results for RAESHON, GANTT (MRN [id number]) as of 08/12/2015 12:35  Ref. Range 08/11/2015 15:48 08/11/2015 21:26 08/12/2015 06:16 08/12/2015 07:49 08/12/2015 11:40  Glucose-Capillary Latest Ref Range: 65-99 mg/dL 217 (H) 271 (H) 315 (H) 302 (H) 391 (H)     Inpatient Diabetes Program Recommendations:    Increase Lantus to 15 units QHS. Titrate until FBS < 180 mg/dL. Consider addition of Novolog 3 units tidwc for meal coverage insulin.  Will follow. Thank you. Lorenda Peck, RD, LDN, CDE Inpatient Diabetes Coordinator [phone]

## 2015-08-12 NOTE — Progress Notes (Signed)
Pt has not urinated since the beginning of the shift. Stated that he needs to pee but it will not come out. Bladder scan shower results of >570ml. MD notified.

## 2015-08-12 NOTE — Progress Notes (Addendum)
[facility] PROGRESS NOTE  RIELLY IMRAN [id number] DOB: [birth year] DOA: 08/10/2015 PCP: Loura Pardon, MD  Assessment/Plan: #1 hypotension None further. Antihypertensives held. Wean hydrocortisone as blood pressure tolerates  #2 atrial fibrillation with RVR CHADSVASC 5 On Coumadin. Currently in normal sinus rhythm. Discuss with cardiology staff. They had signed off the case while at [facility].  #3 acute encephalopathy Improving. Neurology signed off. Suspect related to medications and hypernatremia. Patient is more lucid and cooperative. Will free attempt MRI without anesthesia today. Discussed with MRI tech. If negative, no further workup.  #4 hypernatremia Resolved. Stop D5 W, as worsening blood glucoses.  #5 history of healthcare associated pneumonia complicated with sepsis/acute renal failure requiring intubation Last day of vancomycin today. Physical therapy and occupational therapy have work with the patient and reports that he became dyspneic with a few steps. Will repeat chest x-ray and also check BNP.  #6 acute on chronic renal failure stage III Baseline creatinine about 1.5. Creatinine 1.65 today  #7 hypertension Medications held.  #8 diabetes mellitus Blood glucose is high, likely related to dextrose infusion and stress dose steroids. See above. Continue sliding scale and adjust insulin regimen if needed  #9 chronic diastolic heart failure 2-D echo with EF of 65%. See above  Thrush: Will give Diflucan   Dysphagia: On dysphasia 1 diet with honey thickened liquids. Speech therapy here now to perform FEES  Recent lumbar laminectomy: Per family, Dr. Arnoldo Morale recommended d/c sutures, a week ago, but patient still has sutures in. Will ask nursing staff to discontinue sutures.   Dyspnea: See above. Currently on oxygen and saturating well. Was not on oxygen prior to laminectomy.   Deconditioning: PT OT recommending CIR. Will consult rehabilitation medicine.  PMR and RA  on chronic prednisone: Had been on methotrexate prior to initial admission and 5 mg prednisone. [facility] on [facility]  Anemia: check hemoccult. Iron studies. In part dilutional, critical illness, but was on FeSO4 PTA.  Urinary retention: was unable to void and bladder scan >500 3/1. Has foley in now  Code Status: Full Family Communication: daughter and son at bedside  Disposition Plan: to inpatient rehabilitation once medically stable and testing complete, hopefully within a few days.    Consultants:  Neurology: Dr. Lavone Neri 08/10/2015  Procedures:  Chest x-ray 08/10/2015  Antibiotics:  None  HPI/Subjective: Complains of some dyspnea. First time out of bed for the past several days. Iodine aphasia present. Family reports that his mental status is much improved.  Objective: Filed Vitals:   08/12/15 0441 08/12/15 0730  BP: 129/64   Pulse: 72 87  Temp: 97.7 F (36.5 C)   Resp: 20 20    Intake/Output Summary (Last 24 hours) at 08/12/15 1042 Last data filed at 08/12/15 0800  Gross per 24 hour  Intake 2176.67 ml  Output    800 ml  Net 1376.67 ml   Filed Weights   08/11/15 0422 08/12/15 0600  Weight: 76.5 kg (168 lb 10.4 oz) 77.8 kg (171 lb 8.3 oz)    telemetry: Normal sinus rhythm  Exam:   General:  in chair. Alert. Cooperative. Pleasant. Knows he is in hospital but does not know which one. Nose year and the name of this family members.   HEENT: White plaques on palate and buccal mucosa   Cardiovascular: regular rate rhythm without murmurs gallops rubs   Respiratory: Clear to auscultation bilaterally without wheezes rhonchi or rales   Abdomen: Soft, nontender, nondistended, positive bowel sounds. Musculoskeletal:  No clubbing cyanosis or edema.  neurologic: Nonfocal  Data Reviewed: Basic Metabolic Panel:  Recent Labs Lab 08/06/15 0650 08/07/15 0615  08/09/15 0540 08/10/15 1940 08/11/15 0210 08/11/15 1440 08/12/15 0635  NA 149* 152*  < >  151* 149* 149* 150* 135  K 3.5 3.4*  < > 3.6 4.9 4.5 4.1 3.7  CL 110 113*  < > 114* 113* 113* 115* 105  CO2 29 28  < > 27 26 23 24 24   GLUCOSE 145* 201*  < > 195* 239* 221* 236* 582*  BUN 29* 27*  < > 34* 30* 35* 44* 38*  CREATININE 1.58* 1.47*  < > 1.80* 1.84* 1.94* 2.12* 1.65*  CALCIUM 9.1 8.8*  < > 8.7* 8.7* 8.5* 7.9* 7.2*  MG 1.9 1.9  --  1.8  --   --  1.8  --   PHOS 3.7 2.9  --  3.8  --   --   --   --   < > = values in this interval not displayed. Liver Function Tests:  Recent Labs Lab 08/09/15 0540 08/10/15 1940  AST  --  21  ALT  --  25  ALKPHOS  --  84  BILITOT  --  1.3*  PROT  --  6.4*  ALBUMIN 2.4* 2.4*   No results for input(s): LIPASE, AMYLASE in the last 168 hours.  Recent Labs Lab 08/10/15 1940  AMMONIA 15   CBC:  Recent Labs Lab 08/06/15 0650 08/07/15 0615 08/09/15 0540 08/10/15 1940 08/11/15 1440 08/12/15 0433  WBC 12.5* 9.6 10.7* 8.0 9.4 7.0  NEUTROABS 9.1* 6.5 7.4 6.6  --   --   HGB 10.8* 10.6* 10.9* 11.3* 10.5* 8.9*  HCT 35.1* 35.6* 36.4* 37.5*  35.9* 35.4* 29.2*  MCV 100.9* 99.7 100.6* 101.1* 99.7 98.3  PLT 209 198 202 202 216 186   Cardiac Enzymes:  Recent Labs Lab 08/11/15 1440  TROPONINI 0.05*   BNP (last 3 results)  Recent Labs  07/19/15 1622 08/01/15 0022 08/09/15 0540  BNP 1734.1* 881.4* 357.5*    ProBNP (last 3 results) No results for input(s): PROBNP in the last 8760 hours.  CBG:  Recent Labs Lab 08/11/15 1124 08/11/15 1548 08/11/15 2126 08/12/15 0616 08/12/15 0749  GLUCAP 263* 217* 271* 315* 302*    Recent Results (from the past 240 hour(s))  Culture, Urine     Status: None   Collection Time: 08/05/15  3:30 PM  Result Value Ref Range Status   Specimen Description URINE, RANDOM  Final   Special Requests NONE  Final   Culture 5,000 COLONIES/mL INSIGNIFICANT GROWTH  Final   Report Status 08/06/2015 FINAL  Final     Studies: Portable Chest 1 View  08/10/2015  CLINICAL DATA:  Shortness of breath EXAM:  PORTABLE CHEST 1 VIEW COMPARISON:  08/05/2015 FINDINGS: Low lung volumes with bibasilar atelectasis, worse on the left. Stable cardiomegaly without edema or effusion. No pneumothorax. Right PICC line tip mid SVC level. Residual scoliosis of the spine. Thoracolumbar fusion hardware partially imaged. Trachea is midline. IMPRESSION: Cardiomegaly with basilar atelectasis, worse on the left. Electronically Signed   By: Jerilynn Mages.  Shick M.D.   On: 08/10/2015 18:35    Scheduled Meds: . albuterol  3 mL Nebulization TID  . amiodarone  400 mg Oral Daily  . antiseptic oral rinse  7 mL Mouth Rinse BID  . fluconazole  100 mg Oral Daily  . hydrocortisone sod succinate (SOLU-CORTEF) inj  50 mg Intravenous Q12H  . insulin  aspart  0-15 Units Subcutaneous TID WC  . insulin aspart  0-5 Units Subcutaneous QHS  . insulin glargine  10 Units Subcutaneous QHS  . sodium chloride flush  3 mL Intravenous Q12H  . thiamine  100 mg Oral Daily   Continuous Infusions: . sodium chloride 10 mL/hr at 08/11/15 1236    Principal Problem:   Encephalopathy Active Problems:   Diabetes mellitus type 2, controlled, without complications (HCC)   ANEMIA-UNSPECIFIED   Essential hypertension, benign   Coronary atherosclerosis   GERD   LLL pneumonia   PAF (paroxysmal atrial fibrillation) (HCC)   Hypernatremia   Acute on chronic renal failure (HCC)   Chronic diastolic congestive heart failure ([place])   Hypotension   Thrush    Time spent: [place] [facility]  [email or web address], password [facility] 08/12/2015, 10:42 AM  LOS: 2 days

## 2015-08-12 NOTE — Progress Notes (Signed)
Out of room for testing. Will follow up later to complete CIR consult

## 2015-08-12 NOTE — NC FL2 (Signed)
[place] LEVEL OF CARE SCREENING TOOL     IDENTIFICATION  Patient Name: Curtis Frazier Birthdate: Jun 04, 1941 Sex: male Admission Date (Current Location): 08/10/2015  [facility] and [place] Number:      Facility and Address:  [facility], [place] [address], [place], [place] [address]      Provider Number: [id number]  Attending Physician Name and Address:  Delfina Redwood, MD  Relative Name and Phone Number:  Tamela Oddi, spouse, [phone]    Current Level of Care: Hospital Recommended Level of Care: [place] Prior Approval Number:    Date Approved/Denied:   PASRR Number: [id number]  Discharge Plan: SNF    Current Diagnoses: Patient Active Problem List   Diagnosis Date Noted  . Hypotension 08/11/2015  . Hypernatremia 08/10/2015  . Encephalopathy 08/10/2015  . Acute on chronic renal failure ([place]) 08/10/2015  . Chronic diastolic congestive heart failure ([place]) 08/10/2015  . Persistent atrial fibrillation ([place])   . Acute diastolic congestive heart failure ([place])   . Dyspnea   . Disorientation   . Slow transit constipation   . Back pain without radiation   . Counseling regarding advanced care planning and goals of care   . PAF (paroxysmal atrial fibrillation) ([place]) 07/29/2015  . Advanced care planning/counseling discussion   . Pneumonia   . Change in mental state   . Endotracheally intubated   . CHF (congestive heart failure) ([place])   . Palliative care encounter 07/21/2015  . DNR (do not resuscitate) discussion 07/21/2015  . HCAP (healthcare-associated pneumonia)   . Acute respiratory failure ([place])   . Acute respiratory failure with hypoxia ([place]) 07/18/2015  . Atrial fibrillation with RVR ([place]) 07/18/2015  . Acute pulmonary edema ([place]) 07/18/2015  . Sepsis ([place]) 07/17/2015  . Acute encephalopathy 07/17/2015  . LLL pneumonia 07/17/2015  . Type II or unspecified type diabetes mellitus without mention of complication, not  stated as uncontrolled 07/17/2015  . S/P lumbar laminectomy 07/17/2015  . Elevated troponin 07/17/2015  . Type 2 diabetes mellitus without complication, without long-term current use of insulin ([place])   . Acute bronchitis 03/05/2015  . Back pain, thoracic 01/15/2015  . Chest wall pain 01/15/2015  . Routine general medical examination at a health care facility 11/23/2014  . Productive cough 10/14/2014  . Left flank pain 09/25/2014  . Renal insufficiency 11/14/2013  . Encounter for Medicare annual wellness exam 07/07/2013  . BPH (benign prostatic hyperplasia) 07/05/2012  . Prostate cancer screening 06/27/2012  . Low TSH level 01/22/2012  . PMR (polymyalgia rheumatica) (HCC) 01/20/2012  . Aortic stenosis 07/08/2011  . New onset a-fib ([place]) 07/05/2011    Class: Acute  . HYPERKALEMIA 02/18/2010  . Essential hypertension, benign 09/13/2009  . ANEMIA-UNSPECIFIED 06/10/2009  . ANGIODYSPLASIA-INTESTINE 06/10/2009  . MURMUR 05/21/2009  . Left carotid bruit 05/21/2009  . Coronary atherosclerosis 03/09/2009  . GERD 03/09/2009  . Rheumatoid arthritis ([place]) 04/29/2008  . HX, PERSONAL, COLONIC POLYPS 01/17/2007  . Diabetes mellitus type 2, controlled, without complications ([place]) [id number]  . Elevated lipids 09/18/2006  . PSORIATIC ARTHRITIS 09/18/2006  . PSORIASIS 09/18/2006  . 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 DEGENERATIVE DISC DISEASE 09/18/2006  . SCOLIOSIS 09/18/2006  . TOBACCO ABUSE, HX OF 09/18/2006    Orientation RESPIRATION BLADDER Height & Weight     Self  O2 (Nasal cannula 4L) Continent Weight: 171 lb 8.3 oz (77.8 kg) Height:  5\' 8"  (172.7 cm)  BEHAVIORAL SYMPTOMS/MOOD NEUROLOGICAL BOWEL NUTRITION STATUS  Continent  (Please see DC summary)  AMBULATORY STATUS COMMUNICATION OF NEEDS Skin   Extensive Assist Verbally Surgical wounds (Incision on back)                       Personal Care Assistance Level of Assistance  Bathing, Feeding, Dressing Bathing Assistance:  Maximum assistance Feeding assistance: Independent Dressing Assistance: Limited assistance     Functional Limitations Info             SPECIAL CARE FACTORS FREQUENCY  PT (By licensed PT), OT (By licensed OT)     PT Frequency: min 3x/week OT Frequency: min 2x/week            Contractures      Additional Factors Info  Insulin Sliding Scale Code Status Info: Full Allergies Info: Ace Inhibitors, Angiotensin Receptor Blockers, Metoprolol, Penicillins, Tetracycline   Insulin Sliding Scale Info: insulin aspart (novoLOG) injection 0-15 Units; insulin aspart (novoLOG) injection 0-5 Units; insulin glargine (LANTUS) injection 10 Units;       Current Medications (08/12/2015):  This is the current hospital active medication list Current Facility-Administered Medications  Medication Dose Route Frequency Provider Last Rate Last Dose  . 0.9 %  sodium chloride infusion   Intravenous Continuous Jillyn Hidden, MD 10 mL/hr at 08/11/15 1236    . acetaminophen (TYLENOL) tablet 650 mg  650 mg Oral Q6H PRN Radene Gunning, NP       Or  . acetaminophen (TYLENOL) suppository 650 mg  650 mg Rectal Q6H PRN Lezlie Octave Black, NP      . albuterol (PROVENTIL) (2.5 MG/3ML) 0.083% nebulizer solution 2.5 mg  2.5 mg Nebulization Q6H PRN Waldemar Dickens, MD      . albuterol (PROVENTIL) (2.5 MG/3ML) 0.083% nebulizer solution 3 mL  3 mL Nebulization TID Waldemar Dickens, MD   3 mL at 08/12/15 0729  . amiodarone (PACERONE) tablet 400 mg  400 mg Oral Daily Lezlie Octave Black, NP   400 mg at 08/11/15 1000  . antiseptic oral rinse (CPC / CETYLPYRIDINIUM CHLORIDE 0.05%) solution 7 mL  7 mL Mouth Rinse BID Eugenie Filler, MD   7 mL at 08/11/15 2200  . hydrocortisone sodium succinate (SOLU-CORTEF) 100 MG injection 50 mg  50 mg Intravenous Q6H Radene Gunning, NP   50 mg at 08/12/15 [id number]  . insulin aspart (novoLOG) injection 0-15 Units  0-15 Units Subcutaneous TID WC Radene Gunning, NP   11 Units at 08/12/15 [id number]  . insulin  aspart (novoLOG) injection 0-5 Units  0-5 Units Subcutaneous QHS Radene Gunning, NP   3 Units at 08/11/15 2212  . insulin glargine (LANTUS) injection 10 Units  10 Units Subcutaneous QHS Eugenie Filler, MD   10 Units at 08/11/15 2213  . ondansetron (ZOFRAN) tablet 4 mg  4 mg Oral Q6H PRN Radene Gunning, NP       Or  . ondansetron [facility]) injection 4 mg  4 mg Intravenous Q6H PRN Lezlie Octave Black, NP      . sodium chloride flush (NS) 0.9 % injection 10-40 mL  10-40 mL Intracatheter PRN Waldemar Dickens, MD   10 mL at 08/12/15 0436  . sodium chloride flush (NS) 0.9 % injection 3 mL  3 mL Intravenous Q12H Lezlie Octave Black, NP   3 mL at 08/10/15 2200  . thiamine (VITAMIN B-1) tablet 100 mg  100 mg Oral Daily Norwood Levo, MD   100 mg at 08/11/15 1000  Discharge Medications: Please see discharge summary for a list of discharge medications.  Relevant Imaging Results:  Relevant Lab Results:   Additional Information SSN: [id number]  [place], [place]

## 2015-08-12 NOTE — Progress Notes (Signed)
ANTICOAGULATION CONSULT NOTE - Initial consult Pharmacy Consult for Coumadin Indication: h/o atrial fibrillation  Allergies  Allergen Reactions  . Ace Inhibitors Other (See Comments)    Causes high K  . Angiotensin Receptor Blockers Other (See Comments)    Causes high K  . Metoprolol Other (See Comments)    May cause elevated K level  . Penicillins Swelling and Rash  . Tetracycline Swelling and Rash    Patient Measurements: Height: 5\' 8"  (172.7 cm) Weight: 171 lb 8.3 oz (77.8 kg) IBW/kg (Calculated) : 68.4 Heparin Dosing Weight: 80 kg  Vital Signs: Temp: 97 F (36.1 C) (03/02 1100) Temp Source: Oral (03/02 1100) BP: 109/52 mmHg (03/02 1100) Pulse Rate: 97 (03/02 1100)  Labs:  Recent Labs  08/10/15 0620  08/10/15 1940 08/11/15 0210 08/11/15 1440 08/12/15 0433 08/12/15 0635  HGB  --   < > 11.3*  --  10.5* 8.9*  --   HCT  --   --  37.5*  35.9*  --  35.4* 29.2*  --   PLT  --   --  202  --  216 186  --   LABPROT 30.2*  --   --  31.4*  --  36.1*  --   INR 2.94*  --   --  3.11*  --  3.73*  --   CREATININE  --   < > 1.84* 1.94* 2.12*  --  1.65*  TROPONINI  --   --   --   --  0.05*  --   --   < > = values in this interval not displayed.  Estimated Creatinine Clearance: 36.8 mL/min (by C-G formula based on Cr of 1.65).  Assessment: 75 y.o. male transferred back from [facility] after a seven-day stay with persistent AMS/hypernatremia. Pt was on coumadin for afib. Pt was NPO but is now taking PO's.   Dr. Conley Canal has discontined the heparin consult and requested pharmacy to resume coumadin for h/o Afib.  Heparin drip was never started due to INR was>2 INR has been increasing despite OFF coumadin since admission 2/28. The INR 2.94>3.11>3.73 today. LFTs were within normal on admit 08/10/15.   I am not certain of his last coumadin dose PTA but I believe it to be on 08/09/15 at dose of 5mg  daily.  The coumadin dose was not on the [facility] from [place] although it does warfarin  pharmacy monitoring noted.  The LTAC discharge summary indicates he was taking coumadin 5 mg po daily.  At prior [facility] hospitalization he was started on IV heparin started 2/7/17for new onset Afib. The coumadin was started on 08/03/15, the same day as when patient was discharged from [facility] to [place].   Goal of Therapy:  INR 2-3 Heparin level 0.3-0.7 units/ml Monitor platelets by anticoagulation protocol: Yes   Plan:  No Coumadin today due to INR 3.73 Daily INR  Nicole Cella, RPh Clinical Pharmacist Pager: [phone]  08/12/2015,11:52 AM

## 2015-08-12 NOTE — Procedures (Signed)
Objective Swallowing Evaluation: Type of Study: FEES-Fiberoptic Endoscopic Evaluation of Swallow  Patient Details  Name: Curtis Frazier MRN: [id number] Date of Birth: 01/08/1941  Today's Date: 08/12/2015 Time: SLP Start Time (ACUTE ONLY): 1014-SLP Stop Time (ACUTE ONLY): 1059 SLP Time Calculation (min) (ACUTE ONLY): 45 min  Past Medical History:  Past Medical History  Diagnosis Date  . Other and unspecified hyperlipidemia   . Coronary atherosclerosis of unspecified type of vessel, native or graft   . Personal history of colonic polyps   . Actinic keratosis   . Psoriatic arthropathy ([place])   . Other psoriasis   . Scoliosis (and kyphoscoliosis), idiopathic   . GI bleed 12/10    cecam AVM  . Gastritis 12/10  . Hyperlipidemia     takes Pravastatin daily  . Hypertension     takes Metoprolol daily  . Shortness of breath     with exertion  . Headache(784.0)     occasionally  . Joint pain   . Joint swelling   . Chronic back pain     scoliosis/stenosis/radiculopathy,degenerative disc disease  . Psoriasis   . Bruises easily   . Esophageal reflux     takes Omeprazole daily  . History of colonic polyps   . Hemorrhoids   . Kidney stone     hx of  . Blood transfusion 2010  . PMR (polymyalgia rheumatica) ([facility]) 01/20/2012    tx by specialist on a long prednisone taper  . S/P PTCA (percutaneous transluminal coronary angioplasty)   . Rheumatoid arthritis(714.0)     takes Methotrexate 7pills weekly  . Degeneration of intervertebral disc, site unspecified   . Myocardial infarction ([place]) 2010  . Pneumonia 2008; 07/2015  . Type II or unspecified type diabetes mellitus without mention of complication, not stated as uncontrolled   . Skin cancer     "arms"   Past Surgical History:  Past Surgical History  Procedure Laterality Date  . Rotator cuff repair Left 2000  . Colonoscopy    . Vasectomy  1979  . Coronary angioplasty with stent placement  02/2009    has one stent  . Carotid doppler   10/12    0-39% R and 60-79% left   . Cataract extraction  2012  . Cardiac catheterization  2010  . Lithotripsy  2009  . Tee without cardioversion N/A 08/06/2015    Procedure: TRANSESOPHAGEAL ECHOCARDIOGRAM (TEE);  Surgeon: Skeet Latch, MD;  Location: [facility];  Service: Cardiovascular;  Laterality: N/A;  . Cardioversion N/A 08/06/2015    Procedure: CARDIOVERSION;  Surgeon: Skeet Latch, MD;  Location: [facility] END[facility]COPY;  Service: Cardiovascular;  Laterality: N/A;  . Back surgery    . Lumbar laminectomy  07/2015  . Posterior laminectomy thoracic spine      "fixed the discs"  . Skin cancer excision      "arms"   HPI: 75 year old male with history of hypertension, gerd, A. fib, sepsis related to pneumonia with related shock, respiratory failure requiring intubation, A. fib [facility] suspicious for infectious vs medication induced etiologies. MRI pending. MBS 2/20 with a mild-moderate oropharyngeal dysphagia with recommendations for dysphagia 1, honey thick liquids.   Subjective: pt is eager for a cup of coffee  Assessment / Plan / Recommendation  CHL IP CLINICAL IMPRESSIONS 08/12/2015  Therapy Diagnosis Mild oral phase dysphagia;Moderate pharyngeal phase dysphagia  Clinical Impression Pt continues to have a mild oral and moderate pharyngeal dysphagia due to impaired timing, generalized weakness, and  incomplete airway protection during the swallow. Although somewhat difficult to visualize, his vocal cords appeared to move symmetrically. His larynx and pharynx appeared to have diffuse erythema and dryness, with dried secretions throughout the pharynx noted that were difficult to clear. Silent penetration occurs during the swallow with spoonfuls of nectar thick liquids, cup sips of honey thick liquids, and even x1 with a bolus of puree, although it is cleared with cued coughs. Weakness leads to diffuse residue, although primarily in the valleculae,  which is reduced some with cues for additional swallows. Only mild dyspnea was noted across trials. Recommend to continue with current diet: Dys 1 textures and honey thick liquids by spoon. Pt would benefit from trial of pharyngeal strengthening exercises.  Impact on safety and function Moderate aspiration risk      CHL IP TREATMENT RECOMMENDATION 08/12/2015  Treatment Recommendations Therapy as outlined in treatment plan below     Prognosis 08/12/2015  Prognosis for Safe Diet Advancement Good  Barriers to Reach Goals Severity of deficits  Barriers/Prognosis Comment --    CHL IP DIET RECOMMENDATION 08/12/2015  SLP Diet Recommendations Dysphagia 1 (Puree) solids;Honey thick liquids  Liquid Administration via Spoon  Medication Administration Crushed with puree  Compensations Minimize environmental distractions;Slow rate;Small sips/bites;Multiple dry swallows after each bite/sip  Postural Changes Remain semi-upright after after feeds/meals (Comment);Seated upright at 90 degrees      CHL IP OTHER RECOMMENDATIONS 08/12/2015  Recommended Consults --  Oral Care Recommendations Oral care BID  Other Recommendations --      CHL IP FOLLOW UP RECOMMENDATIONS 08/12/2015  Follow up Recommendations Inpatient Rehab      CHL IP FREQUENCY AND DURATION 08/12/2015  Speech Therapy Frequency (ACUTE ONLY) min 2x/week  Treatment Duration 2 weeks           CHL IP ORAL PHASE 08/12/2015  Oral Phase Impaired  Oral - Pudding Teaspoon --  Oral - Pudding Cup --  Oral - Honey Teaspoon Delayed oral transit  Oral - Honey Cup Delayed oral transit  Oral - Nectar Teaspoon WFL  Oral - Nectar Cup --  Oral - Nectar Straw --  Oral - Thin Teaspoon --  Oral - Thin Cup --  Oral - Thin Straw --  Oral - Puree Delayed oral transit  Oral - Mech Soft NT  Oral - Regular --  Oral - Multi-Consistency --  Oral - Pill --  Oral Phase - Comment --    CHL IP PHARYNGEAL PHASE 08/12/2015  Pharyngeal Phase Impaired  Pharyngeal-  Pudding Teaspoon --  Pharyngeal --  Pharyngeal- Pudding Cup --  Pharyngeal --  Pharyngeal- Honey Teaspoon Delayed swallow initiation-vallecula;Reduced pharyngeal peristalsis;Reduced anterior laryngeal mobility;Reduced laryngeal elevation;Reduced tongue base retraction;Reduced airway/laryngeal closure;Pharyngeal residue - valleculae;Pharyngeal residue - posterior pharnyx  Pharyngeal Material does not enter airway  Pharyngeal- Honey Cup Delayed swallow initiation-vallecula;Reduced pharyngeal peristalsis;Reduced anterior laryngeal mobility;Reduced laryngeal elevation;Reduced tongue base retraction;Reduced airway/laryngeal closure;Pharyngeal residue - valleculae;Pharyngeal residue - posterior pharnyx;Penetration/Aspiration during swallow  Pharyngeal Material enters airway, CONTACTS cords and not ejected out  Pharyngeal- Nectar Teaspoon Delayed swallow initiation-vallecula;Reduced pharyngeal peristalsis;Reduced anterior laryngeal mobility;Reduced laryngeal elevation;Reduced tongue base retraction;Reduced airway/laryngeal closure;Pharyngeal residue - valleculae;Pharyngeal residue - posterior pharnyx;Penetration/Aspiration during swallow  Pharyngeal Material enters airway, CONTACTS cords and not ejected out  Pharyngeal- Nectar Cup --  Pharyngeal --  Pharyngeal- Nectar Straw --  Pharyngeal --  Pharyngeal- Thin Teaspoon --  Pharyngeal --  Pharyngeal- Thin Cup --  Pharyngeal --  Pharyngeal- Thin Straw --  Pharyngeal --  Pharyngeal- Puree Delayed  swallow initiation-vallecula;Reduced pharyngeal peristalsis;Reduced anterior laryngeal mobility;Reduced laryngeal elevation;Reduced tongue base retraction;Reduced airway/laryngeal closure;Pharyngeal residue - valleculae;Pharyngeal residue - posterior pharnyx;Penetration/Aspiration during swallow  Pharyngeal Material enters airway, remains ABOVE vocal cords and not ejected out  Pharyngeal- Mechanical Soft NT  Pharyngeal --  Pharyngeal- Regular --  Pharyngeal --   Pharyngeal- Multi-consistency --  Pharyngeal --  Pharyngeal- Pill --  Pharyngeal --  Pharyngeal Comment --     CHL IP CERVICAL ESOPHAGEAL PHASE 08/12/2015  Cervical Esophageal Phase WFL  Pudding Teaspoon --  Pudding Cup --  Honey Teaspoon --  Honey Cup --  Nectar Teaspoon --  Nectar Cup --  Nectar Straw --  Thin Teaspoon --  Thin Cup --  Thin Straw --  Puree --  Mechanical Soft --  Regular --  Multi-consistency --  Pill --  Cervical Esophageal Comment --    CHL IP GO 07/18/2015  Functional Assessment Tool Used ASHA NOMS and clinical judgment.    Functional Limitations Swallowing  Swallow Current Status [phone]) CI  Swallow Goal Status [id number]) CI  Swallow Discharge Status [phone]) (None)  Motor Speech Current Status [phone]) (None)  Motor Speech Goal Status [phone]) (None)  Motor Speech Goal Status [phone]) (None)  Spoken Language Comprehension Current Status [phone]) (None)  Spoken Language Comprehension Goal Status [id number]) (None)  Spoken Language Comprehension Discharge Status [phone]) (None)  Spoken Language Expression Current Status [phone]) (None)  Spoken Language Expression Goal Status [phone]) (None)  Spoken Language Expression Discharge Status [phone]) (None)  Attention Current Status [id number]) (None)  Attention Goal Status [id number]) (None)  Attention Discharge Status [phone]) (None)  Memory Current Status [id number]) (None)  Memory Goal Status [id number]) (None)  Memory Discharge Status [id number]) (None)  Voice Current Status [id number]) (None)  Voice Goal Status [id number]) (None)  Voice Discharge Status [id number]) (None)  Other Speech-Language Pathology Functional Limitation [phone]) (None)  Other Speech-Language Pathology Functional Limitation Goal Status [id number]) (None)  Other Speech-Language Pathology Functional Limitation Discharge Status [phone]) (None)     Curtis Frazier, M.A. CCC-SLP [phone]  Curtis Frazier 08/12/2015, 11:28 AM

## 2015-08-13 LAB — CBC
HCT: 30.8 % — ABNORMAL LOW (ref 39.0–52.0)
Hemoglobin: 9.3 g/dL — ABNORMAL LOW (ref 13.0–17.0)
MCH: 29.9 pg (ref 26.0–34.0)
MCHC: 30.2 g/dL (ref 30.0–36.0)
MCV: 99 fL (ref 78.0–100.0)
PLATELETS: 207 10*3/uL (ref 150–400)
RBC: 3.11 MIL/uL — AB (ref 4.22–5.81)
RDW: 16.6 % — ABNORMAL HIGH (ref 11.5–15.5)
WBC: 7.7 10*3/uL (ref 4.0–10.5)

## 2015-08-13 LAB — GLUCOSE, CAPILLARY
GLUCOSE-CAPILLARY: 229 mg/dL — AB (ref 65–99)
GLUCOSE-CAPILLARY: 298 mg/dL — AB (ref 65–99)
Glucose-Capillary: 215 mg/dL — ABNORMAL HIGH (ref 65–99)
Glucose-Capillary: 265 mg/dL — ABNORMAL HIGH (ref 65–99)

## 2015-08-13 LAB — PROTIME-INR
INR: 2.67 — AB (ref 0.00–1.49)
PROTHROMBIN TIME: 28.1 s — AB (ref 11.6–15.2)

## 2015-08-13 LAB — BASIC METABOLIC PANEL
Anion gap: 9 (ref 5–15)
BUN: 30 mg/dL — ABNORMAL HIGH (ref 6–20)
CALCIUM: 7.7 mg/dL — AB (ref 8.9–10.3)
CO2: 24 mmol/L (ref 22–32)
CREATININE: 1.34 mg/dL — AB (ref 0.61–1.24)
Chloride: 114 mmol/L — ABNORMAL HIGH (ref 101–111)
GFR, EST AFRICAN AMERICAN: 58 mL/min — AB (ref >60)
GFR, EST NON AFRICAN AMERICAN: 50 mL/min — AB (ref >60)
Glucose, Bld: 250 mg/dL — ABNORMAL HIGH (ref 65–99)
Potassium: 3.5 mmol/L (ref 3.5–5.1)
SODIUM: 147 mmol/L — AB (ref 135–145)

## 2015-08-13 LAB — IRON AND TIBC
IRON: 24 ug/dL — AB (ref 45–182)
Saturation Ratios: 9 % — ABNORMAL LOW (ref 17.9–39.5)
TIBC: 258 ug/dL (ref 250–450)
UIBC: 234 ug/dL

## 2015-08-13 LAB — FERRITIN: Ferritin: 147 ng/mL (ref 24–336)

## 2015-08-13 MED ORDER — PREDNISONE 20 MG PO TABS
40.0000 mg | ORAL_TABLET | Freq: Every day | ORAL | Status: DC
  Administered 2015-08-13 – 2015-08-14 (×2): 40 mg via ORAL
  Filled 2015-08-13: qty 2

## 2015-08-13 MED ORDER — WARFARIN SODIUM 7.5 MG PO TABS
7.5000 mg | ORAL_TABLET | Freq: Once | ORAL | Status: AC
Start: 2015-08-13 — End: 2015-08-13
  Administered 2015-08-13: 7.5 mg via ORAL
  Filled 2015-08-13 (×2): qty 1

## 2015-08-13 MED ORDER — INSULIN ASPART 100 UNIT/ML ~~LOC~~ SOLN
5.0000 [IU] | Freq: Three times a day (TID) | SUBCUTANEOUS | Status: DC
  Administered 2015-08-13 – 2015-08-15 (×8): 5 [IU] via SUBCUTANEOUS

## 2015-08-13 MED ORDER — INSULIN GLARGINE 100 UNIT/ML ~~LOC~~ SOLN
15.0000 [IU] | Freq: Every day | SUBCUTANEOUS | Status: DC
  Administered 2015-08-13: 15 [IU] via SUBCUTANEOUS
  Filled 2015-08-13 (×2): qty 0.15

## 2015-08-13 MED ORDER — GLUCERNA SHAKE PO LIQD
237.0000 mL | Freq: Three times a day (TID) | ORAL | Status: DC
  Administered 2015-08-14 – 2015-08-16 (×4): 237 mL via ORAL

## 2015-08-13 MED ORDER — DEXTROSE 5 % IV SOLN
INTRAVENOUS | Status: DC
  Administered 2015-08-13 – 2015-08-15 (×4): via INTRAVENOUS

## 2015-08-13 NOTE — Care Management Important Message (Signed)
Important Message  Patient Details  Name: Curtis Frazier MRN: [id number] Date of Birth: Oct 20, 1940   Medicare Important Message Given:  Yes    [name] May 08/13/2015, 11:51 AM

## 2015-08-13 NOTE — Progress Notes (Signed)
ANTICOAGULATION CONSULT NOTE - Follow Up Consult  Pharmacy Consult for Coumadin Indication: atrial fibrillation  Allergies  Allergen Reactions  . Ace Inhibitors Other (See Comments)    Causes high K  . Angiotensin Receptor Blockers Other (See Comments)    Causes high K  . Metoprolol Other (See Comments)    May cause elevated K level  . Penicillins Swelling and Rash  . Tetracycline Swelling and Rash    Patient Measurements: Height: 5\' 8"  (172.7 cm) Weight: 171 lb 8.3 oz (77.8 kg) IBW/kg (Calculated) : 68.4  Vital Signs: Temp: 98.1 F (36.7 C) (03/03 0500) Temp Source: Oral (03/03 0500) BP: 126/61 mmHg (03/03 0500) Pulse Rate: 83 (03/03 0500)  Labs:  Recent Labs  08/11/15 0210 08/11/15 1440 08/12/15 0433 08/12/15 0635 08/13/15 0430  HGB  --  10.5* 8.9*  --  9.3*  HCT  --  35.4* 29.2*  --  30.8*  PLT  --  216 186  --  207  LABPROT 31.4*  --  36.1*  --  28.1*  INR 3.11*  --  3.73*  --  2.67*  CREATININE 1.94* 2.12*  --  1.65* 1.34*  TROPONINI  --  0.05*  --   --   --     Estimated Creatinine Clearance: 45.4 mL/min (by C-G formula based on Cr of 1.34).  Assessment: 75yom on coumadin for afib. INR trended up despite not receiving any coumadin since 2/26. Today's INR is back within goal range today 3.73 --> 2.67. He was started on amiodarone and fluconazole 3/2. LFTs wnl.  PTA dose: 5mg  daily  Goal of Therapy:  INR 2-3 Monitor platelets by anticoagulation protocol: Yes   Plan:  1) Coumadin 7.5mg  x 1 2) Daily INR  Deboraha Sprang 08/13/2015,9:51 AM

## 2015-08-13 NOTE — Progress Notes (Signed)
MEDICATION RELATED NOTE   Pharmacy Re:  Home meds Medication history is confusing as patient has been a patient of [facility] and pharmacy records don't coincide very well.  He has not had many of his medications during his stay at [facility].  I will mark his current med-hx. as complete.  Plan:  Please review his home meds and resume those you feel are appropriate for this patient.  Let us know if you are able to obtain additional information and we will be happy to attempt to complete this again.  Rober Minion, PharmD., MS Clinical Pharmacist Pager:  [phone] Thank you for allowing pharmacy to be part of this patients care team. 08/13/2015,2:13 PM

## 2015-08-13 NOTE — Progress Notes (Signed)
Physical Therapy Treatment Patient Details Name: Curtis Frazier MRN: [id number] DOB: December 27, 1940 Today's Date: 08/13/2015    History of Present Illness Patient is a 75 y/o male with hx of lumbar surgery on 2/2, HIV, psoriatic arthritis, scoliosis, HLD, HTN, MI, RA, PMR kidney stones and DM who presents from [facility] after a seven-day stay due to encephalopathy, acute on chronic renal failure and hypernatremia. Patient's altered mental status is suspicious for metabolic derangement vs infectious versus medication induced etiologies. Workup pending.    PT Comments    Pt performed sit to stand mod assist with strong posterior lean, required +2 for gait training mod assist to advance 12+16 ft.  Pt required close chair follow as HR elevates and pt fatigues quickly.  Pt extremely motivated.    Follow Up Recommendations  CIR     Equipment Recommendations  Other (comment) (TBD)    Recommendations for Other Services       Precautions / Restrictions Precautions Precautions: Fall;Back Precaution Comments: Recent spine surgery 2/2. Restrictions Weight Bearing Restrictions: No    Mobility  Bed Mobility Overal bed mobility: Needs Assistance Bed Mobility: Rolling;Sidelying to Sit Rolling: Min guard Sidelying to sit: Min assist       General bed mobility comments: Cues for hand placement to achieve sitting edge of bed.  Pt demonstrated LOB posterior sitting edge of bed.  Sitting balance improved edge of bed as pt scooted forward to place feet on floor.    Transfers Overall transfer level: Needs assistance Equipment used: Rolling walker (2 wheeled) Transfers: Sit to/from Stand Sit to Stand: Mod assist;+2 safety/equipment (req +2 for lines and leads.  ) Stand pivot transfers: Mod assist;+2 safety/equipment       General transfer comment: Required assist to boost into standing position.  Pt required cues for hand placement to push from seated surface.  Pt demonstrates poor eccentric loading  back to seated surface.    Ambulation/Gait Ambulation/Gait assistance: Mod assist;+2 physical assistance;+2 safety/equipment Ambulation Distance (Feet): 12 Feet (+16 ft.  Pt gait distance limited to fatigue and elevated HR.  O2 sats remain 98% on 3L.  ) Assistive device: Rolling walker (2 wheeled) Gait Pattern/deviations: Step-to pattern;Shuffle;Leaning posteriorly;Decreased stride length;Decreased step length - right;Decreased step length - left;Trunk flexed Gait velocity: decreased   General Gait Details: Pt performed gait training with +2 physical assistance.  Pt presents with instability in B knee but no true buckling noted.  Pt required +2 for assist and +3 for chair follow.  Pt fatigues quickly and required close chair follow.     Stairs            Wheelchair Mobility    Modified Rankin (Stroke Patients Only)       Balance     Sitting balance-Leahy Scale: Poor Sitting balance - Comments: posterior lean.     Standing balance-Leahy Scale: Poor Standing balance comment: posterior lean.                    Cognition Arousal/Alertness: Awake/alert Behavior During Therapy: WFL for tasks assessed/performed Overall Cognitive Status: Within Functional Limits for tasks assessed                      Exercises      General Comments        Pertinent Vitals/Pain Pain Assessment: Faces Faces Pain Scale: Hurts little more    Home Living  Prior Function            PT Goals (current goals can now be found in the care plan section) Acute Rehab PT Goals Patient Stated Goal: to return to independence Potential to Achieve Goals: Good Progress towards PT goals: Progressing toward goals    Frequency  Min 3X/week    PT Plan Current plan remains appropriate    Co-evaluation             End of Session Equipment Utilized During Treatment: Gait belt;Oxygen Activity Tolerance: Patient tolerated treatment well Patient  left: in chair;with call bell/phone within reach;with family/visitor present;with chair alarm set     Time: [id number] PT Time Calculation (min) (ACUTE ONLY): 34 min  Charges:  $Gait Training: 8-22 mins $Therapeutic Activity: 8-22 mins                    G Codes:      Cristela Blue Aug 18, 2015, 12:01 PM  Governor Rooks, PTA pager [phone]

## 2015-08-13 NOTE — Progress Notes (Signed)
[facility] PROGRESS NOTE  Curtis Frazier [id number] DOB: Jun 11, 1941 DOA: 08/10/2015 PCP: Loura Pardon, MD  Assessment/Plan: #1 hypotension Change to pred and wean as blood pressure tolerates  #2 atrial fibrillation with RVR CHADSVASC 5 On Coumadin. Currently in normal sinus rhythm.  #3 acute encephalopathy Improving. Neurology signed off. Suspect related to medications and hypernatremia. Patient is more lucid and cooperative. MRI without anything acute  #4 hypernatremia Back up. i suspect yesterday's sodium erroneous. Resume d5W  #5 history of healthcare associated pneumonia complicated with sepsis/acute renal failure requiring intubation Completed abx. CXR and BNP ok  #6 acute on chronic renal failure stage III Baseline creatinine about 1.5.   #7 hypertension Medications held.  #8 diabetes mellitus, uncontrolled Adjusting insulin   #9 chronic diastolic heart failure 2-D echo with EF of 65%. No evidence of CHF  Thrush: conte Diflucan   Dysphagia: On dysphasia 1 diet with honey thickened liquids.   Recent lumbar laminectomy:  Sutures out  Dyspnea: improved  Deconditioning: not stable for CIR. Hopefully Monday. D/w Dr. Naaman Plummer  PMR and RA on chronic prednisone:   Anemia: check hemoccult. Iron low. In part dilutional, critical illness, but was on FeSO4 PTA.  Urinary retention: was unable to void and bladder scan >500 3/1. Has foley in now. Voiding trial in am  Code Status: Full Family Communication: daughters at bedside. Answered multiple, repetitious questions.  Disposition Plan: to inpatient rehabilitation once medically stable and testing complete, hopefully within a few days.    Consultants:  Neurology: Dr. Lavone Neri 08/10/2015  PM&R  Procedures:  Chest x-ray 08/10/2015  Antibiotics:  None  HPI/Subjective: No dyspnea.   Objective: Filed Vitals:   08/13/15 1029 08/13/15 1151  BP: 121/71   Pulse: 99 130  Temp: 98.2 F (36.8 C)   Resp: 20      Intake/Output Summary (Last 24 hours) at 08/13/15 1306 Last data filed at 08/13/15 1154  Gross per 24 hour  Intake    500 ml  Output   1950 ml  Net  -1450 ml   Filed Weights   08/11/15 0422 08/12/15 0600 08/13/15 0500  Weight: 76.5 kg (168 lb 10.4 oz) 77.8 kg (171 lb 8.3 oz) 77.8 kg (171 lb 8.3 oz)    telemetry: Normal sinus rhythm  Exam:   General:  in chair. Alert. Cooperative. Pleasant. Knows he is in hospital but does not know which one. knows year and the name of this family members.   HEENT: White plaques on palate and buccal mucosa slightly improved   Cardiovascular: regular rate rhythm without murmurs gallops rubs   Respiratory: Clear to auscultation bilaterally without wheezes rhonchi or rales   Abdomen: Soft, nontender, nondistended, positive bowel sounds. Musculoskeletal: No clubbing cyanosis or edema.  neurologic: Nonfocal  Data Reviewed: Basic Metabolic Panel:  Recent Labs Lab 08/07/15 0615  08/09/15 0540 08/10/15 1940 08/11/15 0210 08/11/15 1440 08/12/15 0635 08/13/15 0430  NA 152*  < > 151* 149* 149* 150* 135 147*  K 3.4*  < > 3.6 4.9 4.5 4.1 3.7 3.5  CL 113*  < > 114* 113* 113* 115* 105 114*  CO2 28  < > 27 26 23 24 24 24   GLUCOSE 201*  < > 195* 239* 221* 236* 582* 250*  BUN 27*  < > 34* 30* 35* 44* 38* 30*  CREATININE 1.47*  < > 1.80* 1.84* 1.94* 2.12* 1.65* 1.34*  CALCIUM 8.8*  < > 8.7* 8.7* 8.5* 7.9* 7.2* 7.7*  MG 1.9  --  1.8  --   --  1.8  --   --   PHOS 2.9  --  3.8  --   --   --   --   --   < > = values in this interval not displayed. Liver Function Tests:  Recent Labs Lab 08/09/15 0540 08/10/15 1940  AST  --  21  ALT  --  25  ALKPHOS  --  84  BILITOT  --  1.3*  PROT  --  6.4*  ALBUMIN 2.4* 2.4*   No results for input(s): LIPASE, AMYLASE in the last 168 hours.  Recent Labs Lab 08/10/15 1940  AMMONIA 15   CBC:  Recent Labs Lab 08/07/15 0615 08/09/15 0540 08/10/15 1940 08/11/15 1440 08/12/15 0433 08/13/15 0430   WBC 9.6 10.7* 8.0 9.4 7.0 7.7  NEUTROABS 6.5 7.4 6.6  --   --   --   HGB 10.6* 10.9* 11.3* 10.5* 8.9* 9.3*  HCT 35.6* 36.4* 37.5*  35.9* 35.4* 29.2* 30.8*  MCV 99.7 100.6* 101.1* 99.7 98.3 99.0  PLT 198 202 202 216 186 207   Cardiac Enzymes:  Recent Labs Lab 08/11/15 1440  TROPONINI 0.05*   BNP (last 3 results)  Recent Labs  08/01/15 0022 08/09/15 0540 08/12/15 1040  BNP 881.4* 357.5* 222.8*    ProBNP (last 3 results) No results for input(s): PROBNP in the last 8760 hours.  CBG:  Recent Labs Lab 08/12/15 1140 08/12/15 1640 08/12/15 2048 08/13/15 0601 08/13/15 1129  GLUCAP 391* 281* 311* 215* 265*    Recent Results (from the past 240 hour(s))  Culture, Urine     Status: None   Collection Time: 08/05/15  3:30 PM  Result Value Ref Range Status   Specimen Description URINE, RANDOM  Final   Special Requests NONE  Final   Culture 5,000 COLONIES/mL INSIGNIFICANT GROWTH  Final   Report Status 08/06/2015 FINAL  Final  Culture, Urine     Status: None   Collection Time: 08/11/15  2:06 PM  Result Value Ref Range Status   Specimen Description URINE, CLEAN CATCH  Final   Special Requests NONE  Final   Culture NO GROWTH 1 DAY  Final   Report Status 08/12/2015 FINAL  Final  Culture, blood (Routine X 2) w Reflex to ID Panel     Status: None (Preliminary result)   Collection Time: 08/11/15  2:40 PM  Result Value Ref Range Status   Specimen Description BLOOD LEFT ARM  Final   Special Requests IN PEDIATRIC BOTTLE 3CC  Final   Culture NO GROWTH 2 DAYS  Final   Report Status PENDING  Incomplete  Culture, blood (Routine X 2) w Reflex to ID Panel     Status: None (Preliminary result)   Collection Time: 08/11/15  2:45 PM  Result Value Ref Range Status   Specimen Description BLOOD LEFT HAND  Final   Special Requests IN PEDIATRIC BOTTLE 2CC  Final   Culture NO GROWTH 2 DAYS  Final   Report Status PENDING  Incomplete     Studies: Mr Brain Wo Contrast  08/12/2015   CLINICAL DATA:  Acute encephalopathy. Possibly from medication. Altered mental status. EXAM: MRI HEAD WITHOUT CONTRAST TECHNIQUE: Multiplanar, multiecho pulse sequences of the brain and surrounding structures were obtained without intravenous contrast. COMPARISON:  CT head 07/26/2015. FINDINGS: No evidence for acute infarction, hemorrhage, mass lesion, hydrocephalus, or extra-axial fluid. Generalized atrophy. Moderate T2 and FLAIR hyperintensity throughout the white matter, likely chronic microvascular ischemic change. Flow  voids are maintained throughout the carotid, basilar, and vertebral arteries. There are no areas of chronic hemorrhage. Pituitary, pineal, and cerebellar tonsils unremarkable. No upper cervical lesions. BILATERAL mastoid effusions without nasopharyngeal lesion, likely secondary to recumbency, although given the persistence since previous CT, BILATERAL mastoiditis not excluded. 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 BILATERAL cataract extraction. IMPRESSION: No acute intracranial findings.  Atrophy and small vessel disease. BILATERAL mastoid fluid without nasopharyngeal lesion. Likely effusions. Correlate clinically for mastoiditis. Chronic and possibly acute RIGHT maxillary sinus disease. Electronically Signed   By: Staci Righter M.D.   On: 08/12/2015 14:00   Dg Chest Port 1 View  08/12/2015  CLINICAL DATA:  Dyspnea, hypertension, pneumonia 2/17 EXAM: PORTABLE CHEST 1 VIEW COMPARISON:  08/10/2015 FINDINGS: Cardiomediastinal silhouette is stable. No infiltrate or pulmonary edema. Lower thoracic dextroscoliosis again noted. Again noted metallic fixation rods lower thoracic spine and lumbar spine. Right arm PICC line is unchanged in position. No infiltrate or pulmonary edema. Persistent linear atelectasis or scarring in lingula and left base. IMPRESSION: No infiltrate or pulmonary edema. Persistent linear atelectasis or scarring in left base. Electronically Signed   By: Lahoma Crocker  M.D.   On: 08/12/2015 12:49    Scheduled Meds: . albuterol  3 mL Nebulization BID  . amiodarone  400 mg Oral Daily  . antiseptic oral rinse  7 mL Mouth Rinse BID  . atorvastatin  20 mg Oral q1800  . docusate sodium  100 mg Oral BID  . ferrous sulfate  325 mg Oral TID WC  . fluconazole  100 mg Oral Daily  . insulin aspart  0-15 Units Subcutaneous TID WC  . insulin aspart  0-5 Units Subcutaneous QHS  . insulin aspart  5 Units Subcutaneous TID WC  . insulin glargine  15 Units Subcutaneous QHS  . predniSONE  40 mg Oral Q breakfast  . sodium chloride flush  3 mL Intravenous Q12H  . thiamine  100 mg Oral Daily  . warfarin  7.5 mg Oral ONCE-1800  . Warfarin - Pharmacist Dosing Inpatient   Does not apply q1800   Continuous Infusions: . dextrose 50 mL/hr at 08/13/15 1039    Principal Problem:   Encephalopathy Active Problems:   Diabetes mellitus type 2, controlled, without complications (HCC)   ANEMIA-UNSPECIFIED   Essential hypertension, benign   Coronary atherosclerosis   GERD   LLL pneumonia   PAF (paroxysmal atrial fibrillation) (HCC)   Hypernatremia   Acute on chronic renal failure (HCC)   Chronic diastolic congestive heart failure ([place])   Hypotension   Thrush   Urinary retention    Time spent: [place] MD [facility]  [email or web address], password [facility] 08/13/2015, 1:06 PM  LOS: 3 days

## 2015-08-13 NOTE — Progress Notes (Addendum)
Inpatient Rehabilitation  Met with patient and daughter, [name] to discuss team's recommendation for IP Rehab post acute stay.  Provided information booklets and answered questions.  Spoke with RN CM, patient not medically ready at this time.  Daughter will discuss rehab with her siblings.  Plan for my co-worker Manuela Schwartz to follow up on Monday.  Carmelia Roller., CCC/SLP Admission Coordinator  [place]  Cell [phone]

## 2015-08-13 NOTE — Progress Notes (Signed)
Speech Language Pathology Treatment: Dysphagia  Patient Details Name: Curtis Frazier MRN: [id number] DOB: 1941/02/15 Today's Date: 08/13/2015 Time: [id number] SLP Time Calculation (min) (ACUTE ONLY): 27 min  Assessment / Plan / Recommendation Clinical Impression  Pt exhibited no s/s of penetration/aspiration during PO intake. Pt able to recall compensatory strategies independently. SLP instructed pt on pharyngeal strengthening exercises to improve swallow function. Exercises given include Masako (increase tongue base retraction), Mendelsohn (increase laryngeal elevation and airway protection) and pitch glides (laryngeal elevation). Pt able to utilize pharyngeal strengthening exercises with mod verbal, visual and tactile cues provided by SLP. Pt and daughter educated re: strengthening exercises, continued dysphagia intervention and diet recommendation of honey thick liquids and Dysphagia 1 (puree) diet. SLP will f/u to continue patient education and pharyngeal strengthening exercises.   HPI HPI: 75 year old male with history of hypertension, gerd, A. fib, sepsis related to pneumonia with related shock, respiratory failure requiring intubation, [facility]. [facility] suspicious for infectious vs medication induced etiologies. MRI pending. MBS 2/20 with a mild-moderate oropharyngeal dysphagia with recommendations for dysphagia 1, honey thick liquids.       SLP Plan  Continue with current plan of care     Recommendations  Diet recommendations: Dysphagia 1 (puree);Honey-thick liquid Liquids provided via: Teaspoon Medication Administration: Crushed with puree Supervision: Patient able to self feed;Staff to assist with self feeding;Full supervision/cueing for compensatory strategies Compensations: Minimize environmental distractions;Slow rate;Small sips/bites;Multiple dry swallows after each bite/sip Postural Changes and/or Swallow Maneuvers: Seated upright 90  degrees             General recommendations: Rehab consult Oral Care Recommendations: Oral care BID Follow up Recommendations: Inpatient Rehab Plan: Continue with current plan of care     GO                Robyn Nohr 08/13/2015, 12:12 PM  [name], Student-SLP

## 2015-08-14 LAB — BASIC METABOLIC PANEL
Anion gap: 8 (ref 5–15)
BUN: 19 mg/dL (ref 6–20)
CO2: 27 mmol/L (ref 22–32)
Calcium: 8.2 mg/dL — ABNORMAL LOW (ref 8.9–10.3)
Chloride: 112 mmol/L — ABNORMAL HIGH (ref 101–111)
Creatinine, Ser: 1.14 mg/dL (ref 0.61–1.24)
Glucose, Bld: 214 mg/dL — ABNORMAL HIGH (ref 65–99)
POTASSIUM: 3.6 mmol/L (ref 3.5–5.1)
SODIUM: 147 mmol/L — AB (ref 135–145)

## 2015-08-14 LAB — GLUCOSE, CAPILLARY
GLUCOSE-CAPILLARY: 172 mg/dL — AB (ref 65–99)
GLUCOSE-CAPILLARY: 186 mg/dL — AB (ref 65–99)
Glucose-Capillary: 334 mg/dL — ABNORMAL HIGH (ref 65–99)
Glucose-Capillary: 313 mg/dL — ABNORMAL HIGH (ref 65–99)

## 2015-08-14 LAB — PROTIME-INR
INR: 2.6 — AB (ref 0.00–1.49)
Prothrombin Time: 27.5 seconds — ABNORMAL HIGH (ref 11.6–15.2)

## 2015-08-14 MED ORDER — WARFARIN SODIUM 7.5 MG PO TABS
7.5000 mg | ORAL_TABLET | Freq: Once | ORAL | Status: AC
  Administered 2015-08-14: 7.5 mg via ORAL
  Filled 2015-08-14: qty 1

## 2015-08-14 MED ORDER — INSULIN GLARGINE 100 UNIT/ML ~~LOC~~ SOLN
20.0000 [IU] | Freq: Every day | SUBCUTANEOUS | Status: DC
  Administered 2015-08-14 – 2015-08-15 (×2): 20 [IU] via SUBCUTANEOUS
  Filled 2015-08-14 (×3): qty 0.2

## 2015-08-14 MED ORDER — PREDNISONE 20 MG PO TABS
30.0000 mg | ORAL_TABLET | Freq: Every day | ORAL | Status: DC
  Administered 2015-08-15: 30 mg via ORAL
  Filled 2015-08-14 (×2): qty 1

## 2015-08-14 NOTE — Progress Notes (Signed)
Patient lying in bed, no pain distress or needs expressed at this time. Call light within reach 

## 2015-08-14 NOTE — Progress Notes (Signed)
[facility] PROGRESS NOTE  Curtis Frazier [id number] DOB: 07-31-1940 DOA: 08/10/2015 PCP: Loura Pardon, MD  Assessment/Plan: #1 hypotension None further. Wean prednisone  #2 atrial fibrillation with RVR CHADSVASC 5 On Coumadin. Currently in normal sinus rhythm.  #3 acute encephalopathy Improving. Neurology signed off. Suspect related to medications and hypernatremia. Patient is more lucid and cooperative. MRI without anything acute  #4 hypernatremia Continue D5W.  #5 history of healthcare associated pneumonia complicated with sepsis/acute renal failure requiring intubation Completed abx. CXR and BNP ok  #6 acute on chronic renal failure stage III Baseline creatinine about 1.5.   #7 hypertension Medications held.  #8 diabetes mellitus, uncontrolled Adjusting insulin   #9 chronic diastolic heart failure 2-D echo with EF of 65%. No evidence of CHF  Thrush: cont Diflucan. improving  Dysphagia: On dysphasia 1 diet with honey thickened liquids.   Recent lumbar laminectomy:  Sutures out  Dyspnea: improved  Deconditioning: not stable for CIR. Hopefully Monday. D/w Dr. Naaman Plummer  PMR and RA on chronic prednisone:   Anemia: check hemoccult. Iron low. In part dilutional, critical illness, but was on FeSO4 PTA.  Urinary retention: was unable to void and bladder scan >500 3/1. Has foley in now. Voiding today  Code Status: Full Family Communication: daughters at bedside. Disposition Plan: to inpatient rehabilitation once medically stable and testing complete, hopefully within a few days.    Consultants:  Neurology: Dr. Lavone Neri 08/10/2015  PM&R  Procedures:  Chest x-ray 08/10/2015  Antibiotics:  None  HPI/Subjective: No dyspnea.   Objective: Filed Vitals:   08/14/15 0835 08/14/15 1100  BP: 171/70 116/57  Pulse: 97 97  Temp: 98.4 F (36.9 C) 98.8 F (37.1 C)  Resp:      Intake/Output Summary (Last 24 hours) at 08/14/15 1324 Last data filed at  08/14/15 1035  Gross per 24 hour  Intake 1437.5 ml  Output   1700 ml  Net -262.5 ml   Filed Weights   08/11/15 0422 08/12/15 0600 08/13/15 0500  Weight: 76.5 kg (168 lb 10.4 oz) 77.8 kg (171 lb 8.3 oz) 77.8 kg (171 lb 8.3 oz)    telemetry: Normal sinus rhythm  Exam:   General:  in chair. Alert. Cooperative. Pleasant. Knows he is in hospital but does not know which one. knows year and the name of this family members. Thinks I am a physical therapist   HEENT: thrush nearly resolved  Cardiovascular: regular rate rhythm without murmurs gallops rubs   Respiratory: Clear to auscultation bilaterally without wheezes rhonchi or rales   Abdomen: Soft, nontender, nondistended, positive bowel sounds. Musculoskeletal: No clubbing cyanosis or edema.  neurologic: Nonfocal  Data Reviewed: Basic Metabolic Panel:  Recent Labs Lab 08/09/15 0540  08/11/15 0210 08/11/15 1440 08/12/15 0635 08/13/15 0430 08/14/15 0459  NA 151*  < > 149* 150* 135 147* 147*  K 3.6  < > 4.5 4.1 3.7 3.5 3.6  CL 114*  < > 113* 115* 105 114* 112*  CO2 27  < > 23 24 24 24 27   GLUCOSE 195*  < > 221* 236* 582* 250* 214*  BUN 34*  < > 35* 44* 38* 30* 19  CREATININE 1.80*  < > 1.94* 2.12* 1.65* 1.34* 1.14  CALCIUM 8.7*  < > 8.5* 7.9* 7.2* 7.7* 8.2*  MG 1.8  --   --  1.8  --   --   --   PHOS 3.8  --   --   --   --   --   --   < > =  values in this interval not displayed. Liver Function Tests:  Recent Labs Lab 08/09/15 0540 08/10/15 1940  AST  --  21  ALT  --  25  ALKPHOS  --  84  BILITOT  --  1.3*  PROT  --  6.4*  ALBUMIN 2.4* 2.4*   No results for input(s): LIPASE, AMYLASE in the last 168 hours.  Recent Labs Lab 08/10/15 1940  AMMONIA 15   CBC:  Recent Labs Lab 08/09/15 0540 08/10/15 1940 08/11/15 1440 08/12/15 0433 08/13/15 0430  WBC 10.7* 8.0 9.4 7.0 7.7  NEUTROABS 7.4 6.6  --   --   --   HGB 10.9* 11.3* 10.5* 8.9* 9.3*  HCT 36.4* 37.5*  35.9* 35.4* 29.2* 30.8*  MCV 100.6* 101.1* 99.7  98.3 99.0  PLT 202 202 216 186 207   Cardiac Enzymes:  Recent Labs Lab 08/11/15 1440  TROPONINI 0.05*   BNP (last 3 results)  Recent Labs  08/01/15 0022 08/09/15 0540 08/12/15 1040  BNP 881.4* 357.5* 222.8*    ProBNP (last 3 results) No results for input(s): PROBNP in the last 8760 hours.  CBG:  Recent Labs Lab 08/13/15 1129 08/13/15 1631 08/13/15 2120 08/14/15 0656 08/14/15 1111  GLUCAP 265* 229* 298* 172* 186*    Recent Results (from the past 240 hour(s))  Culture, Urine     Status: None   Collection Time: 08/05/15  3:30 PM  Result Value Ref Range Status   Specimen Description URINE, RANDOM  Final   Special Requests NONE  Final   Culture 5,000 COLONIES/mL INSIGNIFICANT GROWTH  Final   Report Status 08/06/2015 FINAL  Final  Culture, Urine     Status: None   Collection Time: 08/11/15  2:06 PM  Result Value Ref Range Status   Specimen Description URINE, CLEAN CATCH  Final   Special Requests NONE  Final   Culture NO GROWTH 1 DAY  Final   Report Status 08/12/2015 FINAL  Final  Culture, blood (Routine X 2) w Reflex to ID Panel     Status: None (Preliminary result)   Collection Time: 08/11/15  2:40 PM  Result Value Ref Range Status   Specimen Description BLOOD LEFT ARM  Final   Special Requests IN PEDIATRIC BOTTLE 3CC  Final   Culture NO GROWTH 3 DAYS  Final   Report Status PENDING  Incomplete  Culture, blood (Routine X 2) w Reflex to ID Panel     Status: None (Preliminary result)   Collection Time: 08/11/15  2:45 PM  Result Value Ref Range Status   Specimen Description BLOOD LEFT HAND  Final   Special Requests IN PEDIATRIC BOTTLE 2CC  Final   Culture NO GROWTH 3 DAYS  Final   Report Status PENDING  Incomplete     Studies: Mr Brain Wo Contrast  08/12/2015  CLINICAL DATA:  Acute encephalopathy. Possibly from medication. Altered mental status. EXAM: MRI HEAD WITHOUT CONTRAST TECHNIQUE: Multiplanar, multiecho pulse sequences of the brain and surrounding  structures were obtained without intravenous contrast. COMPARISON:  CT head 07/26/2015. FINDINGS: No evidence for acute infarction, hemorrhage, mass lesion, hydrocephalus, or extra-axial fluid. Generalized atrophy. Moderate T2 and FLAIR hyperintensity throughout the white matter, likely chronic microvascular ischemic change. Flow voids are maintained throughout the carotid, basilar, and vertebral arteries. There are no areas of chronic hemorrhage. Pituitary, pineal, and cerebellar tonsils unremarkable. No upper cervical lesions. BILATERAL mastoid effusions without nasopharyngeal lesion, likely secondary to recumbency, although given the persistence since previous CT, BILATERAL mastoiditis not excluded.  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 BILATERAL cataract extraction. IMPRESSION: No acute intracranial findings.  Atrophy and small vessel disease. BILATERAL mastoid fluid without nasopharyngeal lesion. Likely effusions. Correlate clinically for mastoiditis. Chronic and possibly acute RIGHT maxillary sinus disease. Electronically Signed   By: Staci Righter M.D.   On: 08/12/2015 14:00    Scheduled Meds: . albuterol  3 mL Nebulization BID  . amiodarone  400 mg Oral Daily  . antiseptic oral rinse  7 mL Mouth Rinse BID  . atorvastatin  20 mg Oral q1800  . docusate sodium  100 mg Oral BID  . feeding supplement (GLUCERNA SHAKE)  237 mL Oral TID BM  . ferrous sulfate  325 mg Oral TID WC  . fluconazole  100 mg Oral Daily  . insulin aspart  0-15 Units Subcutaneous TID WC  . insulin aspart  0-5 Units Subcutaneous QHS  . insulin aspart  5 Units Subcutaneous TID WC  . insulin glargine  15 Units Subcutaneous QHS  . [START ON 08/15/2015] predniSONE  30 mg Oral Q breakfast  . sodium chloride flush  3 mL Intravenous Q12H  . thiamine  100 mg Oral Daily  . warfarin  7.5 mg Oral ONCE-1800  . Warfarin - Pharmacist Dosing Inpatient   Does not apply q1800   Continuous Infusions: . dextrose 100 mL  (08/14/15 0815)    Principal Problem:   Encephalopathy Active Problems:   Diabetes mellitus type 2, controlled, without complications (HCC)   ANEMIA-UNSPECIFIED   Essential hypertension, benign   Coronary atherosclerosis   GERD   LLL pneumonia   PAF (paroxysmal atrial fibrillation) (HCC)   Hypernatremia   Acute on chronic renal failure (HCC)   Chronic diastolic congestive heart failure ([place])   Hypotension   Thrush   Urinary retention    Time spent: [place] [facility]  [email or web address], password [facility] 08/14/2015, 1:24 PM  LOS: 4 days

## 2015-08-14 NOTE — Progress Notes (Signed)
Late entry:    Sutures in lower back were removed on March 2nd at 1600 pm.   Site cleansed with betadine, small amt of fluid in middle of suture line. Patient tolerated well.  Mervyn Skeeters, RN

## 2015-08-14 NOTE — Progress Notes (Signed)
ANTICOAGULATION CONSULT NOTE - Follow Up Consult  Pharmacy Consult for Coumadin Indication: atrial fibrillation  Allergies  Allergen Reactions  . Ace Inhibitors Other (See Comments)    Causes high K  . Angiotensin Receptor Blockers Other (See Comments)    Causes high K  . Metoprolol Other (See Comments)    May cause elevated K level  . Penicillins Swelling and Rash  . Tetracycline Swelling and Rash    Patient Measurements: Height: 5\' 8"  (172.7 cm) Weight: 171 lb 8.3 oz (77.8 kg) IBW/kg (Calculated) : 68.4  Vital Signs: Temp: 98.2 F (36.8 C) (03/04 0445) Temp Source: Oral (03/04 0445) BP: 142/74 mmHg (03/04 0445) Pulse Rate: 90 (03/04 0445)  Labs:  Recent Labs  08/11/15 1440 08/12/15 0433 08/12/15 0635 08/13/15 0430 08/14/15 0459  HGB 10.5* 8.9*  --  9.3*  --   HCT 35.4* 29.2*  --  30.8*  --   PLT 216 186  --  207  --   LABPROT  --  36.1*  --  28.1* 27.5*  INR  --  3.73*  --  2.67* 2.60*  CREATININE 2.12*  --  1.65* 1.34* 1.14  TROPONINI 0.05*  --   --   --   --     Estimated Creatinine Clearance: 53.3 mL/min (by C-G formula based on Cr of 1.14).  Assessment: 75yom on coumadin for afib. INR had trended up and was supratherapeutic despite no coumadin since 2/26. INR back within goal range yesterday and coumadin resumed. INR remains therapeutic today at 2.60. He continues on amiodarone and fluconazole. LFTs wnl.  PTA dose: 5mg  daily  Goal of Therapy:  INR 2-3 Monitor platelets by anticoagulation protocol: Yes   Plan:  1) Repeat coumadin 7.5mg  x 1 2) Daily INR  Deboraha Sprang 08/14/2015,11:28 AM

## 2015-08-15 LAB — GLUCOSE, CAPILLARY
GLUCOSE-CAPILLARY: 274 mg/dL — AB (ref 65–99)
GLUCOSE-CAPILLARY: 222 mg/dL — AB (ref 65–99)
Glucose-Capillary: 145 mg/dL — ABNORMAL HIGH (ref 65–99)
Glucose-Capillary: 168 mg/dL — ABNORMAL HIGH (ref 65–99)

## 2015-08-15 LAB — CBC
HEMATOCRIT: 31.5 % — AB (ref 39.0–52.0)
HEMOGLOBIN: 9.8 g/dL — AB (ref 13.0–17.0)
MCH: 30.4 pg (ref 26.0–34.0)
MCHC: 31.1 g/dL (ref 30.0–36.0)
MCV: 97.8 fL (ref 78.0–100.0)
Platelets: 249 10*3/uL (ref 150–400)
RBC: 3.22 MIL/uL — ABNORMAL LOW (ref 4.22–5.81)
RDW: 16.1 % — ABNORMAL HIGH (ref 11.5–15.5)
WBC: 9.1 10*3/uL (ref 4.0–10.5)

## 2015-08-15 LAB — BASIC METABOLIC PANEL
ANION GAP: 10 (ref 5–15)
BUN: 13 mg/dL (ref 6–20)
CO2: 27 mmol/L (ref 22–32)
Calcium: 8 mg/dL — ABNORMAL LOW (ref 8.9–10.3)
Chloride: 106 mmol/L (ref 101–111)
Creatinine, Ser: 0.99 mg/dL (ref 0.61–1.24)
GFR calc Af Amer: >60 mL/min (ref >60)
GFR calc non Af Amer: >60 mL/min (ref >60)
GLUCOSE: 169 mg/dL — AB (ref 65–99)
POTASSIUM: 3.6 mmol/L (ref 3.5–5.1)
Sodium: 143 mmol/L (ref 135–145)

## 2015-08-15 LAB — PROTIME-INR
INR: 5.03 (ref 0.00–1.49)
Prothrombin Time: 45.2 seconds — ABNORMAL HIGH (ref 11.6–15.2)

## 2015-08-15 MED ORDER — PREDNISONE 20 MG PO TABS
20.0000 mg | ORAL_TABLET | Freq: Every day | ORAL | Status: DC
  Administered 2015-08-16: 20 mg via ORAL
  Filled 2015-08-15: qty 1

## 2015-08-15 MED ORDER — SENNA 8.6 MG PO TABS
2.0000 | ORAL_TABLET | Freq: Every day | ORAL | Status: DC
  Administered 2015-08-15 – 2015-08-17 (×3): 17.2 mg via ORAL
  Filled 2015-08-15 (×3): qty 2

## 2015-08-15 MED ORDER — PHYTONADIONE 5 MG PO TABS
2.5000 mg | ORAL_TABLET | Freq: Once | ORAL | Status: AC
  Administered 2015-08-15: 2.5 mg via ORAL
  Filled 2015-08-15 (×2): qty 1

## 2015-08-15 NOTE — Progress Notes (Addendum)
Received call from [facility]. Patient's INR is 5.03, paged Rogue Bussing to advise. Rogue Bussing stated to speak with the pharmacy.  Spoke with Jeneen Rinks, lab value will be addressed later today, will hold coumadin for tonight,

## 2015-08-15 NOTE — Progress Notes (Signed)
Patient lying in bed, no pain, distress or needs expressed at this time. Call light is within reach.

## 2015-08-15 NOTE — Progress Notes (Signed)
[facility] PROGRESS NOTE  Curtis Frazier [id number] DOB: 1941/03/26 DOA: 08/10/2015 PCP: Loura Pardon, MD  Assessment/Plan: #1 hypotension None further. Wean prednisone  As able  #2 atrial fibrillation with RVR CHADSVASC 5 Coumadin supratherapeutic. No bleeding, but at risk. Will give 2.5 mg po vit K. Currently in normal sinus rhythm on amiodarone.  #3 acute encephalopathy Improving. Neurology signed off. Suspect related to medications and hypernatremia. Patient is more lucid and cooperative. MRI without anything acute  #4 hypernatremia Resolved. D/c dextrose IVF  #5 history of healthcare associated pneumonia complicated with sepsis/acute renal failure requiring intubation Completed abx. CXR and BNP ok  #6 acute on chronic renal failure stage III Baseline creatinine about 1.5.   #7 hypertension Medications held.  #8 diabetes mellitus, uncontrolled Better controlled  #9 chronic diastolic heart failure 2-D echo with EF of 65%. No evidence of CHF  Thrush: cont Diflucan. improving  Dysphagia: On dysphasia 1 diet with honey thickened liquids.   Recent lumbar laminectomy:  Sutures out. Wound with small area of dehiscence. No evidence of infection. Will notify Dr. Arnoldo Morale Monday  Deconditioning: to CIR tomorrow if stable  PMR and RA on chronic prednisone:   Anemia: check hemoccult. Iron low. In part dilutional, critical illness, but was on FeSO4 PTA.  Urinary retention: voiding without foley  Code Status: Full Family Communication: daughter and son at bedside. Disposition Plan: CIR tomorrow if stable   Consultants:  Neurology: Dr. Lavone Neri 08/10/2015  PM&R  Procedures:  Chest x-ray 08/10/2015  Antibiotics:  None  HPI/Subjective: No dyspnea.   Objective: Filed Vitals:   08/14/15 2105 08/15/15 0500  BP: 131/64 148/67  Pulse: 89 92  Temp: 98 F (36.7 C) 97.6 F (36.4 C)  Resp: 16 18    Intake/Output Summary (Last 24 hours) at 08/15/15  1313 Last data filed at 08/15/15 0655  Gross per 24 hour  Intake      0 ml  Output   1175 ml  Net  -1175 ml   Filed Weights   08/12/15 0600 08/13/15 0500 08/15/15 0500  Weight: 77.8 kg (171 lb 8.3 oz) 77.8 kg (171 lb 8.3 oz) 83.2 kg (183 lb 6.8 oz)    telemetry: Normal sinus rhythm  Exam:   General:  Alert. Oriented to place year  HEENT: thrush resolved  Cardiovascular: regular rate rhythm without murmurs gallops rubs   Respiratory: Clear to auscultation bilaterally without wheezes rhonchi or rales   Abdomen: Soft, nontender, nondistended, positive bowel sounds.  Back: incision with small area of dehiscense Musculoskeletal: No clubbing cyanosis or edema.  neurologic: Nonfocal  Data Reviewed: Basic Metabolic Panel:  Recent Labs Lab 08/09/15 0540  08/11/15 1440 08/12/15 0635 08/13/15 0430 08/14/15 0459 08/15/15 0424  NA 151*  < > 150* 135 147* 147* 143  K 3.6  < > 4.1 3.7 3.5 3.6 3.6  CL 114*  < > 115* 105 114* 112* 106  CO2 27  < > 24 24 24 27 27   GLUCOSE 195*  < > 236* 582* 250* 214* 169*  BUN 34*  < > 44* 38* 30* 19 13  CREATININE 1.80*  < > 2.12* 1.65* 1.34* 1.14 0.99  CALCIUM 8.7*  < > 7.9* 7.2* 7.7* 8.2* 8.0*  MG 1.8  --  1.8  --   --   --   --   PHOS 3.8  --   --   --   --   --   --   < > = values in  this interval not displayed. Liver Function Tests:  Recent Labs Lab 08/09/15 0540 08/10/15 1940  AST  --  21  ALT  --  25  ALKPHOS  --  84  BILITOT  --  1.3*  PROT  --  6.4*  ALBUMIN 2.4* 2.4*   No results for input(s): LIPASE, AMYLASE in the last 168 hours.  Recent Labs Lab 08/10/15 1940  AMMONIA 15   CBC:  Recent Labs Lab 08/09/15 0540 08/10/15 1940 08/11/15 1440 08/12/15 0433 08/13/15 0430 08/15/15 0424  WBC 10.7* 8.0 9.4 7.0 7.7 9.1  NEUTROABS 7.4 6.6  --   --   --   --   HGB 10.9* 11.3* 10.5* 8.9* 9.3* 9.8*  HCT 36.4* 37.5*  35.9* 35.4* 29.2* 30.8* 31.5*  MCV 100.6* 101.1* 99.7 98.3 99.0 97.8  PLT 202 202 216 186 207 249    Cardiac Enzymes:  Recent Labs Lab 08/11/15 1440  TROPONINI 0.05*   BNP (last 3 results)  Recent Labs  08/01/15 0022 08/09/15 0540 08/12/15 1040  BNP 881.4* 357.5* 222.8*    ProBNP (last 3 results) No results for input(s): PROBNP in the last 8760 hours.  CBG:  Recent Labs Lab 08/14/15 1111 08/14/15 1615 08/14/15 2105 08/15/15 0625 08/15/15 1119  GLUCAP 186* 334* 313* 145* 168*    Recent Results (from the past 240 hour(s))  Culture, Urine     Status: None   Collection Time: 08/05/15  3:30 PM  Result Value Ref Range Status   Specimen Description URINE, RANDOM  Final   Special Requests NONE  Final   Culture 5,000 COLONIES/mL INSIGNIFICANT GROWTH  Final   Report Status 08/06/2015 FINAL  Final  Culture, Urine     Status: None   Collection Time: 08/11/15  2:06 PM  Result Value Ref Range Status   Specimen Description URINE, CLEAN CATCH  Final   Special Requests NONE  Final   Culture NO GROWTH 1 DAY  Final   Report Status 08/12/2015 FINAL  Final  Culture, blood (Routine X 2) w Reflex to ID Panel     Status: None (Preliminary result)   Collection Time: 08/11/15  2:40 PM  Result Value Ref Range Status   Specimen Description BLOOD LEFT ARM  Final   Special Requests IN PEDIATRIC BOTTLE 3CC  Final   Culture NO GROWTH 3 DAYS  Final   Report Status PENDING  Incomplete  Culture, blood (Routine X 2) w Reflex to ID Panel     Status: None (Preliminary result)   Collection Time: 08/11/15  2:45 PM  Result Value Ref Range Status   Specimen Description BLOOD LEFT HAND  Final   Special Requests IN PEDIATRIC BOTTLE 2CC  Final   Culture NO GROWTH 3 DAYS  Final   Report Status PENDING  Incomplete     Studies: No results found.  Scheduled Meds: . albuterol  3 mL Nebulization BID  . amiodarone  400 mg Oral Daily  . antiseptic oral rinse  7 mL Mouth Rinse BID  . atorvastatin  20 mg Oral q1800  . feeding supplement (GLUCERNA SHAKE)  237 mL Oral TID BM  . ferrous sulfate   325 mg Oral TID WC  . fluconazole  100 mg Oral Daily  . insulin aspart  0-15 Units Subcutaneous TID WC  . insulin aspart  0-5 Units Subcutaneous QHS  . insulin aspart  5 Units Subcutaneous TID WC  . insulin glargine  20 Units Subcutaneous QHS  . [START ON 08/16/2015] predniSONE  20 mg Oral Q breakfast  . senna  2 tablet Oral Daily  . sodium chloride flush  3 mL Intravenous Q12H  . thiamine  100 mg Oral Daily   Continuous Infusions: . dextrose 100 mL/hr at 08/15/15 0548    Principal Problem:   Encephalopathy Active Problems:   Diabetes mellitus type 2, controlled, without complications (HCC)   ANEMIA-UNSPECIFIED   Essential hypertension, benign   Coronary atherosclerosis   GERD   LLL pneumonia   PAF (paroxysmal atrial fibrillation) (HCC)   Hypernatremia   Acute on chronic renal failure (HCC)   Chronic diastolic congestive heart failure ([place])   Hypotension   Thrush   Urinary retention    Time spent: [place] MD [facility]  [email or web address], password [facility] 08/15/2015, 1:13 PM  LOS: 5 days

## 2015-08-16 LAB — BASIC METABOLIC PANEL
Anion gap: 11 (ref 5–15)
BUN: 12 mg/dL (ref 6–20)
CO2: 26 mmol/L (ref 22–32)
CREATININE: 1.09 mg/dL (ref 0.61–1.24)
Calcium: 8.5 mg/dL — ABNORMAL LOW (ref 8.9–10.3)
Chloride: 104 mmol/L (ref 101–111)
Glucose, Bld: 109 mg/dL — ABNORMAL HIGH (ref 65–99)
POTASSIUM: 3.8 mmol/L (ref 3.5–5.1)
SODIUM: 141 mmol/L (ref 135–145)

## 2015-08-16 LAB — GLUCOSE, CAPILLARY
GLUCOSE-CAPILLARY: 93 mg/dL (ref 65–99)
GLUCOSE-CAPILLARY: 190 mg/dL — AB (ref 65–99)
Glucose-Capillary: 235 mg/dL — ABNORMAL HIGH (ref 65–99)
Glucose-Capillary: 154 mg/dL — ABNORMAL HIGH (ref 65–99)

## 2015-08-16 LAB — CULTURE, BLOOD (ROUTINE X 2)
CULTURE: NO GROWTH
Culture: NO GROWTH

## 2015-08-16 LAB — PROTIME-INR
INR: 3.2 — AB (ref 0.00–1.49)
Prothrombin Time: 32.1 seconds — ABNORMAL HIGH (ref 11.6–15.2)

## 2015-08-16 MED ORDER — SENNA 8.6 MG PO TABS: 2.0000 | ORAL_TABLET | Freq: Every day | ORAL | Status: DC | PRN

## 2015-08-16 MED ORDER — INSULIN ASPART 100 UNIT/ML ~~LOC~~ SOLN: 0.0000 [IU] | Freq: Every day | SUBCUTANEOUS | Status: DC

## 2015-08-16 MED ORDER — RESOURCE THICKENUP CLEAR PO POWD
ORAL | Status: DC | PRN
  Filled 2015-08-16: qty 125

## 2015-08-16 MED ORDER — INSULIN GLARGINE 100 UNIT/ML ~~LOC~~ SOLN
10.0000 [IU] | Freq: Every day | SUBCUTANEOUS | Status: DC
  Administered 2015-08-16: 10 [IU] via SUBCUTANEOUS
  Filled 2015-08-16 (×2): qty 0.1

## 2015-08-16 MED ORDER — STARCH (THICKENING) PO POWD
ORAL | Status: DC | PRN
  Filled 2015-08-16: qty 227

## 2015-08-16 MED ORDER — WARFARIN SODIUM 5 MG PO TABS: ORAL_TABLET | ORAL | Status: DC

## 2015-08-16 MED ORDER — PREDNISONE 10 MG PO TABS
10.0000 mg | ORAL_TABLET | Freq: Every day | ORAL | Status: DC
  Administered 2015-08-17: 10 mg via ORAL
  Filled 2015-08-16: qty 1

## 2015-08-16 MED ORDER — INSULIN ASPART 100 UNIT/ML ~~LOC~~ SOLN: 0.0000 [IU] | Freq: Three times a day (TID) | SUBCUTANEOUS | Status: DC

## 2015-08-16 NOTE — Progress Notes (Signed)
Physical Therapy Treatment Patient Details Name: Curtis Frazier MRN: [id number] DOB: August 20, 1940 Today's Date: 08/16/2015    History of Present Illness Patient is a 75 y/o male with hx of lumbar surgery on 2/2, HIV, psoriatic arthritis, scoliosis, HLD, HTN, MI, RA, PMR kidney stones and DM who [facility]-day stay due to encephalopathy, acute on chronic renal failure and hypernatremia. Patient's altered mental status is suspicious for metabolic derangement vs infectious versus medication induced etiologies. Workup pending.    PT Comments    Patient progressing with ambulation distance, though still needing +2 A for safety due to LE weakness.  Feel CIR level rehab indicated to progress to home with family support.  Follow Up Recommendations  CIR     Equipment Recommendations  Other (comment) (TBA)    Recommendations for Other Services       Precautions / Restrictions Precautions Precautions: Fall;Back Precaution Comments: Recent spine surgery 2/2.    Mobility  Bed Mobility Overal bed mobility: Needs Assistance   Rolling: Min guard Sidelying to sit: Min assist       General bed mobility comments: assist for mobility due to weakness  Transfers Overall transfer level: Needs assistance Equipment used: Rolling walker (2 wheeled) Transfers: Sit to/from Stand Sit to Stand: Mod assist;+2 safety/equipment         General transfer comment: initially trying to pull up on walker and cues and assist to place hands on bed, then had difficulty up from EOB so +2 A provided and pt still switched hands to pull up on walker.  second trial from recliner improved with armrests on chair  Ambulation/Gait Ambulation/Gait assistance: Mod assist;+2 physical assistance Ambulation Distance (Feet): 20 Feet (and 30') Assistive device: Rolling walker (2 wheeled) Gait Pattern/deviations: Step-through pattern;Decreased stride length;Trendelenburg;Drifts right/left     General  Gait Details: noted L >R knee buckling and L hip weakness trendelenberg at times.  Cued patient to look up at goal for distance with improved upright posture and distance with gait   Stairs            Wheelchair Mobility    Modified Rankin (Stroke Patients Only)       Balance Overall balance assessment: Needs assistance         Standing balance support: Bilateral upper extremity supported Standing balance-Leahy Scale: Poor Standing balance comment: heavy UE support due to LE weakness and posterior lean requiring mod A for safety                    Cognition Arousal/Alertness: Awake/alert Behavior During Therapy: WFL for tasks assessed/performed Overall Cognitive Status: Within Functional Limits for tasks assessed                      Exercises      General Comments General comments (skin integrity, edema, etc.): two daughters in room during session,  Reported he is supposed to transfer to CIR      Pertinent Vitals/Pain Faces Pain Scale: Hurts a little bit Pain Location: back Pain Descriptors / Indicators: Aching Pain Intervention(s): Monitored during session;Repositioned    Home Living                      Prior Function            PT Goals (current goals can now be found in the care plan section) Progress towards PT goals: Progressing toward goals    Frequency  Min 3X/week  PT Plan Current plan remains appropriate    Co-evaluation             End of Session Equipment Utilized During Treatment: Gait belt Activity Tolerance: Patient limited by fatigue Patient left: in chair;with call bell/phone within reach;with chair alarm set;with family/visitor present     Time: [id number] PT Time Calculation (min) (ACUTE ONLY): 18 min  Charges:  $Gait Training: 8-22 mins                    G Codes:      Reginia Naas Aug 25, 2015, 4:40 PM Magda Kiel, [place] Aug 25, 2015

## 2015-08-16 NOTE — Consult Note (Signed)
   [facility] CM Inpatient Consult   08/16/2015  [name] 06-11-1941 [id number]   Patient screened for [facility] [facility] services. Noted he is supposed to transfer to CIR today. Will follow up at later time.  Marthenia Rolling, MSN-Ed, RN,BSN [facility] Liaison [phone]

## 2015-08-16 NOTE — Progress Notes (Signed)
Patient ID: Curtis Frazier, male   DOB: [birth year], 75 y.o.   MRN: [id number] Subjective:  the patient is alert and pleasant. He looks much better. He is awaiting a rehabilitation bed.  Objective: Vital signs in last 24 hours: Temp:  [98.2 F (36.8 C)-98.7 F (37.1 C)] 98.7 F (37.1 C) (03/06 1500) Pulse Rate:  [84-94] 93 (03/06 1500) Resp:  [16-20] 18 (03/06 1500) BP: (118-124)/(58-74) 118/68 mmHg (03/06 1500) SpO2:  [94 %-98 %] 96 % (03/06 1500)  Intake/Output from previous day: 03/05 0701 - 03/06 0700 In: 120 [P.O.:120] Out: 1550 [Urine:1550] Intake/Output this shift: Total I/O In: 960 [P.O.:960] Out: -   Physical exam the patient is alert and pleasant. He is moving his lower extremities well. His lumbar incision has approximately 1 cm area of superficial dehiscence. There is no erythema, drainage, signs of infection, etc.  Lab Results:  Recent Labs  08/15/15 0424  WBC 9.1  HGB 9.8*  HCT 31.5*  PLT 249   BMET  Recent Labs  08/15/15 0424 08/16/15 0510  NA 143 141  K 3.6 3.8  CL 106 104  CO2 27 26  GLUCOSE 169* 109*  BUN 13 12  CREATININE 0.99 1.09  CALCIUM 8.0* 8.5*    Studies/Results: No results found.  Assessment/Plan: Wound dehiscence: I think this should heal fine with daily dressing changes. Please have the patient follow-up with me in office in about 2 weeks for another wound check. Please call if I can be of further assistance.   LOS: 6 days     Kyrin Garn D 08/16/2015, 5:43 PM

## 2015-08-16 NOTE — Care Management Note (Signed)
Case Management Note  Patient Details  Name: Curtis Frazier MRN: [id number] Date of Birth: 1940-06-20                 Action/Plan: NCM did follow up with CIR.  Scheduled dc to IP rehab on 08/17/2015.   Expected Discharge Date:  08/16/2015               Expected Discharge Plan:  [place]  In-House Referral:  NA  Discharge planning Services  CM Consult  Post Acute Care Choice:  NA Choice offered to:  NA  DME Arranged:  N/A DME Agency:  NA  HH Arranged:  NA HH Agency:  NA  Status of Service:  Completed, signed off  Medicare Important Message Given:  Yes Date Medicare IM Given:    Medicare IM give by:    Date Additional Medicare IM Given:    Additional Medicare Important Message give by:     If discussed at [place] of Stay Meetings, dates discussed:    Additional Comments:  Erenest Rasher, RN 08/16/2015, 2:21 PM

## 2015-08-16 NOTE — Progress Notes (Signed)
Inpatient Rehabilitation  I unfortunately do not have a bed to offer Mr. Weisheit today.  I am hopeful for a bed tomorrow.  Note pt.'s surgical incision showing some signs of dehiscence.  Per text message from  [place], no surgical intervention planned.  I have updated Jonnie Finner, RNCM of the above.  I also updated pt. and his two daughters of our status.  Please call if questions.  [place] Admissions Coordinator Cell [phone] Office [phone]

## 2015-08-16 NOTE — Progress Notes (Signed)
Speech Language Pathology Treatment: Dysphagia  Patient Details Name: Curtis Frazier MRN: [id number] DOB: 02-Dec-1940 Today's Date: 08/16/2015 Time: [id number] SLP Time Calculation (min) (ACUTE ONLY): 18 min  Assessment / Plan / Recommendation Clinical Impression   Pt exhibited no s/s of aspiration during PO intake. Pt able to demonstrate compensatory strategies independently. Daughter said they are helping pt perform pharygeal exercises each day. Pt's voice is judged to be getting stronger per daughter. Continue current plan of care including pharyngeal exercises and diet tolerance monitoring.           HPI HPI: 75 year old male with history of hypertension, gerd, A. fib, sepsis related to pneumonia with related shock, respiratory failure requiring intubation, A. fib with [facility] suspicious for infectious vs medication induced etiologies. MRI pending. MBS 2/20 with a mild-moderate oropharyngeal dysphagia with recommendations for dysphagia 1, honey thick liquids.       SLP Plan  Continue with current plan of care     Recommendations  Diet recommendations: Dysphagia 1 (puree);Honey-thick liquid Liquids provided via: Teaspoon Medication Administration: Crushed with puree Supervision: Full supervision/cueing for compensatory strategies;Patient able to self feed Compensations: Minimize environmental distractions;Slow rate;Small sips/bites;Multiple dry swallows after each bite/sip Postural Changes and/or Swallow Maneuvers: Seated upright 90 degrees             Oral Care Recommendations: Oral care BID Follow up Recommendations: Inpatient Rehab Plan: Continue with current plan of care     GO           Functional Assessment Tool Used: ASHA NOMS and clinical judgment.   Functional Limitations: Swallowing    [name] [name] 08/16/2015, 5:47 PM

## 2015-08-16 NOTE — Discharge Summary (Addendum)
Physician Discharge Summary  Curtis Frazier [id number] DOB: 1941-02-06 DOA: 08/10/2015  PCP: Loura Pardon, MD  Admit date: 08/10/2015 Discharge date: 08/17/2015  Time spent: greater than 30 minutes  Recommendations for Outpatient Follow-up:  1. To inpatient rehab 2. Wean prednisone to maintenance (5 mg as blood pressure tolerates) 3. Resume metoprolol as blood pressure tolerates 4. Monitor CBGs 5. Monitor weights, resume lasix if appropriate 6. Monitor sodium periodically 7. Resume warfarin per protocol when INR below 3, monitor INR per protocol 8. Follow up with [facility] after discharge from [place] [facility] if no further IV needs rehabilitation.    Discharge Diagnoses:  Principal Problem:   Acute Encephalopathy Active Problems:   Diabetes mellitus type 2, controlled, without complications (HCC)   ANEMIA-UNSPECIFIED   Essential hypertension, benign   Coronary atherosclerosis   GERD   LLL pneumonia   PAF (paroxysmal atrial fibrillation) (HCC)   Hypernatremia   Acute on chronic renal failure (HCC)   Chronic diastolic congestive heart failure (HCC)   Hypotension   Thrush   Urinary retention Recent lumbar surgery  Discharge Condition: stable  Diet recommendation: Dysphagia 1 diet with honey thickened liquids, meds whole in puree. Advance per speech therapy  Filed Weights   08/12/15 0600 08/13/15 0500 08/15/15 0500  Weight: 77.8 kg (171 lb 8.3 oz) 77.8 kg (171 lb 8.3 oz) 83.2 kg (183 lb 6.8 oz)    History of present illness:  75 year old with a history of hypertension, A. fib, sepsis related to pneumonia with related shock, respiratory failure requiring intubation, A. fib with RVR, presents to room 2 W. 30 from [facility] chief complaint of persistent encephalopathy, acute on chronic renal failure, hypernatremia.  Information obtained from the chart and the daughter and son who are at the bedside. Reports he was doing fairly well at [facility] until several days ago he  developed worsening lethargy and weakness. Initially they thought it was due to lack of sleep at night. In addition they reported that his sodium levels been high for several days. In addition planing of a sore throat with facial grimace during feeds and some choking and coughing with eating and drinking. No report or complaints of any chest pain palpitations headache. No report of diarrhea nausea vomiting. Reports on day of admission he is "sleeping more these ever slept "but admits "his respirations are more even". She also reports an event yesterday where he "stared out into space" and not respond. Episode lasted 20-30 seconds.  Upon admission afebrile and hemodynamically stable with a blood pressure on the soft side. Oxygen saturation level is 100% on 4 L.   Hospital Course:  acute encephalopathy MRI without anything acute. EEG without seizure activity. B-12 folate not low. TSH normal. Likely secondary to hypernemia and acute delirium. Improving daily.  neurology consulted and signed off.   hypotension Developed hypotension on 3/1. All antihypertensives were held and patient was put on stress dose steroids. He is on chronic prednisone at 5 mg and had been on stress dose steroids which were slowly being weaned. Suspect adrenal shock. Blood pressure has been stable on tapering prednisone dosing. Had 20 mg on 3/6. Would continue to taper at rehabilitation down to home dose as able. He remains in sinus rhythm off metoprolol and Cardizem, but these may be resumed slowly at rehabilitation if blood pressure or heart rate becomes a problem.   atrial fibrillation with RVR CHADSVASC 5 Coumadin supratherapeutic. Currently in normal sinus rhythm on amiodarone. has been off metoprolol  and Cardizem for several days. Monitor for the need to resume. Would resume Coumadin once INR below 3.0. Will need follow-up with [facility] after discharge from rehabilitation..  hypernatremia Required several days of  hypotonic fluids. Sodium has been within normal limits for several days now. Likely secondary to poor by mouth intake. Would monitor and push fluids.   history of healthcare associated pneumonia complicated with sepsis/acute renal failure requiring intubation Completed abx. CXR and BNP ok. No oxygen requirements at this time.   acute on chronic renal failure stage III Baseline creatinine about 1.5.   hypertension Medications held. See above  diabetes mellitus, uncontrolled  Better controlled.  adjust Lantus and sliding scale if needed. Has remained off oral hypoglycemics   chronic diastolic heart failure 2-D echo with EF of 65%. No evidence of acute  CHF  Thrush: resolved after about 5 days of Diflucan   Dysphagia: On dysphagia 1 diet with honey thickened liquids.  hopefully, may advance diet per speech therapy at rehabilitation.   Recent lumbar laminectomy: Sutures out. Wound with small area of dehiscence. No evidence of infection. Dr. Arnoldo Morale evaluated incision  Deconditioning: to CIR  PMR and RA on chronic prednisone:   Anemia:  Iron low. In part dilutional, critical illness, but was on FeSO4 PTA. no evidence of bleeding   Urinary retention: Required several days of Foley catheter but has had a successful voiding trial and is now voiding fine without Foley catheter.   Procedures:  None   Consultations:  Rehabilitation medicine  Neurology   Discharge Exam: Filed Vitals:   08/15/15 2049 08/16/15 0419  BP:  124/74  Pulse:  94  Temp: 98.6 F (37 C) 98.2 F (36.8 C)  Resp:  16    General: Alert. Oriented. Remembers my name. Appropriate. Comfortable. Cardiovascular: Regular rate rhythm without murmurs gallops rubs Respiratory: Clear to auscultation bilaterally without wheezes rhonchi or rales Abdomen soft nontender nondistended Back: Incision without erythema or drainage. Mild separation of wound edges Extremities no clubbing cyanosis or edema Neurologic  nonfocal  Discharge Instructions    Current Discharge Medication List    START taking these medications   Details  !! insulin aspart (NOVOLOG) 100 UNIT/ML injection Inject 0-15 Units into the skin 3 (three) times daily with meals. Qty: 10 mL, Refills: 11    !! insulin aspart (NOVOLOG) 100 UNIT/ML injection Inject 0-5 Units into the skin at bedtime. Qty: 10 mL, Refills: 11    senna (SENOKOT) 8.6 MG TABS tablet Take 2 tablets (17.2 mg total) by mouth daily as needed for mild constipation. Qty: 120 each, Refills: 0     !! - Potential duplicate medications found. Please discuss with provider.    CONTINUE these medications which have CHANGED   Details  warfarin (COUMADIN) 5 MG tablet Warfarin per pharmacy protocol. Start when INR below 3.0      CONTINUE these medications which have NOT CHANGED   Details  acetaminophen (TYLENOL) 325 MG tablet Take 650 mg by mouth every 6 (six) hours as needed (Pain scale of 1-4 or temp > 101).    atorvastatin (LIPITOR) 20 MG tablet Take 20 mg by mouth daily at 6 PM.    calcium-vitamin D (OSCAL WITH D) 500-200 MG-UNIT tablet Take 1 tablet by mouth daily with breakfast.    docusate sodium (COLACE) 100 MG capsule Take 100 mg by mouth 2 (two) times daily.    ferrous sulfate 325 (65 FE) MG tablet Take 325 mg by mouth 3 (three) times daily  with meals.    predniSONE (DELTASONE) 10 MG tablet Take 10 mg by mouth daily with breakfast. Last dose of 3/1. Then to taper    amiodarone (PACERONE) 400 MG tablet Take 1 tablet (400 mg total) by mouth daily.    insulin glargine (LANTUS) 100 UNIT/ML injection Inject 0.1 mLs (10 Units total) into the skin daily. Qty: 10 mL, Refills: 11    levalbuterol (XOPENEX) 0.63 MG/3ML nebulizer solution Take 3 mLs (0.63 mg total) by nebulization every 6 (six) hours. Qty: 3 mL, Refills: 12      STOP taking these medications     albuterol (PROVENTIL) (2.5 MG/3ML) 0.083% nebulizer solution      furosemide (LASIX) 20 MG  tablet      glipiZIDE (GLUCOTROL) 5 MG tablet      magnesium sulfate 2 GM/50ML SOLN infusion      metoprolol (LOPRESSOR) 5 MG/5ML SOLN injection      metoprolol (LOPRESSOR) 50 MG tablet      Multiple Vitamins-Minerals (MULTIVITAMINS THER. W/MINERALS) TABS tablet      potassium chloride 10 MEQ/50ML      potassium chloride SA (K-DUR,KLOR-CON) 20 MEQ tablet      sodium polystyrene (KAYEXALATE) 15 GM/60ML suspension      vancomycin 1,000 mg in sodium chloride 0.9 % 250 mL      acetylcysteine (MUCOMYST) 20 % nebulizer solution      diltiazem (CARDIZEM) 90 MG tablet      furosemide (LASIX) 10 MG/ML injection      glipiZIDE (GLUCOTROL XL) 5 MG 24 hr tablet      guaiFENesin (ROBITUSSIN) 100 MG/5ML SOLN      heparin 100-0.45 UNIT/ML-% infusion      meropenem 1 g in sodium chloride 0.9 % 100 mL      nitroGLYCERIN (NITROSTAT) 0.4 MG SL tablet      pantoprazole sodium (PROTONIX) 40 mg/20 mL PACK      pravastatin (PRAVACHOL) 20 MG tablet      Vancomycin (VANCOCIN) 750 MG/150ML SOLN        Allergies  Allergen Reactions  . Ace Inhibitors Other (See Comments)    Causes high K  . Angiotensin Receptor Blockers Other (See Comments)    Causes high K  . Metoprolol Other (See Comments)    May cause elevated K level  . Penicillins Swelling and Rash  . Tetracycline Swelling and Rash      The results of significant diagnostics from this hospitalization (including imaging, microbiology, ancillary and laboratory) are listed below for reference.    Significant Diagnostic Studies: Ct Head Wo Contrast  07/26/2015  CLINICAL DATA:  Altered mental status, difficulty breathing EXAM: CT HEAD WITHOUT CONTRAST TECHNIQUE: Contiguous axial images were obtained from the base of the skull through the vertex without intravenous contrast. COMPARISON:  07/17/2015 FINDINGS: Significant diffuse cortical atrophy with low attenuation in the deep white matter. No hydrocephalus. No evidence of infarct or  mass. No hemorrhage or extra-axial fluid. Near complete opacification right maxillary sinus unchanged. Mild inflammatory change ethmoid air cells again identified. Bilateral mastoid air cell fluid worse when compared to prior study. IMPRESSION: Stable ethmoid and right maxillary sinusitis. Increased severity of bilateral mastoid air cell fluid suggesting possibility of mastoiditis. Electronically Signed   By: Skipper Cliche M.D.   On: 07/26/2015 17:46   Ct Head Wo Contrast  07/17/2015  CLINICAL DATA:  pt awoke this morning "not acting right" has been experiencing acute confusion. Pt w/ back surgery x2 days ago, FEVER 102oriented to  location, disoriented to situation and time EXAM: CT HEAD WITHOUT CONTRAST TECHNIQUE: Contiguous axial images were obtained from the base of the skull through the vertex without intravenous contrast. COMPARISON:  None. FINDINGS: Severe diffuse age-related atrophy with moderate low attenuation in the deep white matter. No evidence of vascular territory infarct or mass. No hemorrhage or extra-axial fluid. No hydrocephalus. The calvarium is intact. There is near complete opacification of the right maxillary sinus. Several bilateral ethmoid air cells are opacified or show inflammatory change particularly on the right. Several mastoid air cells on the right are opacified. IMPRESSION: Age-related involutional change. Significant inflammatory change involving the maxillary sinus, with inflammatory change also involving right mastoid air cells and ethmoid air cells. Electronically Signed   By: Skipper Cliche M.D.   On: 07/17/2015 19:55   Ct Chest Wo Contrast  07/26/2015  CLINICAL DATA:  Altered mental status, difficulty breathing, unknown infection source EXAM: CT CHEST WITHOUT CONTRAST TECHNIQUE: Multidetector CT imaging of the chest was performed following the standard protocol without IV contrast. COMPARISON:  07/26/15 FINDINGS: The patient is intubated with endotracheal tube ending  above the carina. NG tube crosses into the stomach. Right PICC line extends to the cavoatrial junction. There is extensive coronary artery calcification. There is calcification of the aortic valves and of the thoracic aorta diffusely. There is cardiac enlargement. There is a small to moderate pericardial effusion. There is a moderate left pleural effusion and a small right pleural effusion. There are mildly enlarged mediastinal lymph nodes diffusely measuring up to about 12 mm. There is consolidation in both lower lobes. Consolidation is much more extensive on the left and. Posterior dependent consolidation extends into the left upper lobe. Images through the upper abdomen demonstrate no acute findings. There are no acute musculoskeletal findings. Thoracic spine compression deformities are stable from 07/17/2015. IMPRESSION: Significant increase in pleural effusions and bilateral consolidation when compared to 07/17/2015. Consolidation may represent compressive atelectasis but particularly on the left superimposed pneumonia or pneumonitis not excluded. Small to moderate pericardial effusion increased when compared to 07/17/2015. Electronically Signed   By: Skipper Cliche M.D.   On: 07/26/2015 17:53   Ct Angio Chest Pe W/cm &/or Wo Cm  07/17/2015  CLINICAL DATA:  Back surgery 3 days ago with severe chest pain and shortness of breath. EXAM: CT ANGIOGRAPHY CHEST WITH CONTRAST TECHNIQUE: Multidetector CT imaging of the chest was performed using the standard protocol during bolus administration of intravenous contrast. Multiplanar CT image reconstructions and MIPs were obtained to evaluate the vascular anatomy. CONTRAST:  156mL OMNIPAQUE IOHEXOL 350 MG/ML SOLN COMPARISON:  07/05/2011 FINDINGS: THORACIC INLET/BODY WALL: Bilateral thyroid nodules measuring up to 11 mm, likely stable from 2013 when allowing streak artifact on previous study. No acute finding MEDIASTINUM: Mild cardiomegaly. No pericardial effusion. There  is mild to moderate aortic valve calcifications/sclerosis without visible progression since 2013. Diffuse atherosclerosis, including the coronary arteries. No evidence of acute aortic syndrome. CTA of the pulmonary arteries is limited by bolus dispersion and respiratory motion at the bases, primarily affecting subsegmental vessels. There is no visible pulmonary embolism. Patulous esophagus. LUNG WINDOWS: Streaky opacities in the lower lungs with volume loss consistent with atelectasis. Intermittent airway mucus/debris, also seen on the previous study. Interlobular septal thickening at the apices compatible with early edema. Probable emphysema, with uncertainty related to respiratory motion. UPPER ABDOMEN: Cholelithiasis. OSSEOUS: T10 to lumbar posterior fixation. There are compression fractures of the T5, T7, and T8 vertebral bodies without acute/unhealed fracture line visible. Progressive and  advanced adjacent segment disc degeneration above the fusion. Review of the MIP images confirms the above findings. IMPRESSION: 1. No evidence of pulmonary embolism. 2. Bibasilar atelectasis. 3. Early pulmonary edema. 4. Study degraded by respiratory motion. Electronically Signed   By: Monte Fantasia M.D.   On: 07/17/2015 21:37   Mr Brain Wo Contrast  08/12/2015  CLINICAL DATA:  Acute encephalopathy. Possibly from medication. Altered mental status. EXAM: MRI HEAD WITHOUT CONTRAST TECHNIQUE: Multiplanar, multiecho pulse sequences of the brain and surrounding structures were obtained without intravenous contrast. COMPARISON:  CT head 07/26/2015. FINDINGS: No evidence for acute infarction, hemorrhage, mass lesion, hydrocephalus, or extra-axial fluid. Generalized atrophy. Moderate T2 and FLAIR hyperintensity throughout the white matter, likely chronic microvascular ischemic change. Flow voids are maintained throughout the carotid, basilar, and vertebral arteries. There are no areas of chronic hemorrhage. Pituitary, pineal, and  cerebellar tonsils unremarkable. No upper cervical lesions. BILATERAL mastoid effusions without nasopharyngeal lesion, likely secondary to recumbency, although given the persistence since previous CT, BILATERAL mastoiditis not excluded. 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 BILATERAL cataract extraction. IMPRESSION: No acute intracranial findings.  Atrophy and small vessel disease. BILATERAL mastoid fluid without nasopharyngeal lesion. Likely effusions. Correlate clinically for mastoiditis. Chronic and possibly acute RIGHT maxillary sinus disease. Electronically Signed   By: Staci Righter M.D.   On: 08/12/2015 14:00   Mr Jeri Cos [id number] Contrast  07/21/2015  CLINICAL DATA:  75 year old male with recent back surgery. Fever, confusion, sepsis. Intubated. Initial encounter. EXAM: MRI HEAD WITHOUT AND WITH CONTRAST TECHNIQUE: Multiplanar, multiecho pulse sequences of the brain and surrounding structures were obtained without and with intravenous contrast. CONTRAST:  48mL MULTIHANCE GADOBENATE DIMEGLUMINE 529 MG/ML IV SOLN COMPARISON:  Head CT without contrast 07/17/2015. FINDINGS: No restricted diffusion to suggest acute infarction. No midline shift, mass effect, evidence of mass lesion, ventriculomegaly, extra-axial collection or acute intracranial hemorrhage. Cervicomedullary junction and pituitary are within normal limits. Negative visualized cervical spine. Major intracranial vascular flow voids are preserved. Aronoff and white matter signal is within normal limits for age throughout the brain. No cortical encephalomalacia or chronic cerebral blood products. No abnormal enhancement identified. No dural thickening. Intubated. Small volume fluid in the pharynx. Right greater than left mastoid effusions. Right greater than left paranasal sinus fluid and opacification. Other Visible internal auditory structures appear normal. Postoperative changes to both globes. Negative orbit and scalp soft tissues. Normal  bone marrow signal. IMPRESSION: 1. Negative for age MRI appearance of the brain. 2. Mastoid and paranasal sinus fluid/inflammation in the setting of intubation. Electronically Signed   By: Genevie Ann M.D.   On: 07/21/2015 16:41   Mr Lumbar Spine W Wo Contrast  07/21/2015  CLINICAL DATA:  Status post lumbar laminectomy 2 days ago. History of rheumatoid arthritis. Fever and sepsis. Question infection. EXAM: MRI LUMBAR SPINE WITHOUT AND WITH CONTRAST TECHNIQUE: Multiplanar and multiecho pulse sequences of the lumbar spine were obtained without and with intravenous contrast. CONTRAST:  20 cc MultiHance IV. COMPARISON:  MRI lumbar spine 06/09/2015. Single intraoperative view of the lumbar spine 07/15/2015. FINDINGS: The patient is status post T10-L5 fusion with pedicle screws and stabilization bars in place. Extensive artifact from hardware. No evidence of discitis or osteomyelitis is identified. Vertebral body height and alignment are maintained. Descending nerve roots appear clumped from L3-4 to mid L5 suggestive of arachnoiditis. Imaged intra-abdominal contents are unremarkable. The T9-10 to T11-12 levels are imaged in the sagittal plane only. The central canal and foramina appear open. The T12-L1 to  L2-3 levels are obscured on axial imaging by artifact from hardware. L3-4: Status post fusion. The central canal and foramina appear open. L4-5: Status post laminectomy and fusion. The central canal and foramina appear open. L5-S1: The patient has a new right laminotomy defect. There is a fluid collection in the surgical bed measuring approximately 3.7 cm AP by 1.1 cm transverse by 2.3 cm craniocaudal. Fluid extends into the posterior subcutaneous tissues. Posterior to the L5 vertebral body, there is a focus most compatible with a disc protrusion measuring 2.4 cm transverse by 1.1 cm AP x 2.1 cm craniocaudal. The central canal appears decompressed and the disc is smaller than on the prior exam. The foramina are open.  IMPRESSION: Interval right laminotomy at L5-S1 for discectomy. Findings compatible with a disc protrusion are identified. The protrusion appears smaller than on the prior examination and the central canal and foramina are decompressed. Fluid collection in the laminectomy bed extending into the subcutaneous soft tissues cannot be definitively characterized but is likely a postoperative seroma given the patient's recent surgery. Status post T10-L5 fusion. The central canal and foramina appear open at these levels although multiple levels are obscured by artifact. 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 Electronically Signed   By: Inge Rise M.D.   On: 07/21/2015 16:50   Dg Chest Port 1 View  08/12/2015  CLINICAL DATA:  Dyspnea, hypertension, pneumonia 2/17 EXAM: PORTABLE CHEST 1 VIEW COMPARISON:  08/10/2015 FINDINGS: Cardiomediastinal silhouette is stable. No infiltrate or pulmonary edema. Lower thoracic dextroscoliosis again noted. Again noted metallic fixation rods lower thoracic spine and lumbar spine. Right arm PICC line is unchanged in position. No infiltrate or pulmonary edema. Persistent linear atelectasis or scarring in lingula and left base. IMPRESSION: No infiltrate or pulmonary edema. Persistent linear atelectasis or scarring in left base. Electronically Signed   By: Lahoma Crocker M.D.   On: 08/12/2015 12:49   Portable Chest 1 View  08/10/2015  CLINICAL DATA:  Shortness of breath EXAM: PORTABLE CHEST 1 VIEW COMPARISON:  08/05/2015 FINDINGS: Low lung volumes with bibasilar atelectasis, worse on the left. Stable cardiomegaly without edema or effusion. No pneumothorax. Right PICC line tip mid SVC level. Residual scoliosis of the spine. Thoracolumbar fusion hardware partially imaged. Trachea is midline. IMPRESSION: Cardiomegaly with basilar atelectasis, worse on the left. Electronically Signed   By: Jerilynn Mages.  Shick M.D.   On: 08/10/2015 18:35   Dg Chest Port 1  View  08/05/2015  CLINICAL DATA:  Evaluate pneumonia EXAM: PORTABLE CHEST 1 VIEW COMPARISON:  July 31, 2015 FINDINGS: The heart size and mediastinal contours are stable. Heart size is enlarged. A right central venous line is identified with distal tip in the superior vena cava. There are increased pulmonary interstitium bilaterally. There is no focal consolidation or pleural effusion. The visualized skeletal structures are stable. IMPRESSION: Mild congestive heart failure.  No focal pneumonia. Electronically Signed   By: Abelardo Diesel M.D.   On: 08/05/2015 15:20   Dg Chest Port 1 View  07/31/2015  CLINICAL DATA:  Dyspnea EXAM: PORTABLE CHEST 1 VIEW COMPARISON:  07/29/2015 FINDINGS: Shallow inspiration. Cardiac enlargement with mild pulmonary vascular congestion. Diffuse peripheral interstitial pattern to the lungs with hazy infiltration in the lung bases likely due to edema. Probable small left pleural effusion. Mild progression since previous study. An enteric tube is been placed. The tip is off the field of view but below the left hemidiaphragm. Right PICC line is unchanged in position with tip  over the low SVC. No pneumothorax. Calcified and tortuous aorta. Postoperative changes in the thoracic and upper lumbar spine. Probable thoracolumbar scoliosis with convexity towards the right. IMPRESSION: Cardiac enlargement with pulmonary vascular congestion and pulmonary edema, progressing since previous study. Electronically Signed   By: Lucienne Capers M.D.   On: 07/31/2015 22:30   Dg Chest Port 1 View  07/29/2015  CLINICAL DATA:  Respiratory failure, CHF, sepsis, acute encephalopathy EXAM: PORTABLE CHEST 1 VIEW COMPARISON:  Portable chest x-ray of July 28, 2015 FINDINGS: The lungs are adequately inflated. The pulmonary interstitial markings are slightly less conspicuous overall today. The right hemidiaphragm is sharp. On the left the hemidiaphragm is now partially visible. The cardiac silhouette  remains enlarged. The pulmonary vascularity is engorged but more distinct today. The mediastinum is normal in width. There has been interval extubation of the trachea and of the esophagus. The PICC line tip projects over the junction of the proximal and midportions of the SVC. IMPRESSION: Mild interval improvement in pulmonary interstitial edema secondary to CHF. Decreasing bilateral pleural effusions. Interval extubation of the trachea and esophagus. Electronically Signed   By: David  Martinique M.D.   On: 07/29/2015 07:26   Dg Chest Port 1 View  07/28/2015  CLINICAL DATA:  Ventilator dependent EXAM: PORTABLE CHEST 1 VIEW COMPARISON:  07/26/2015 FINDINGS: Cardiac shadow remains enlarged. An endotracheal tube, nasogastric catheter and right-sided PICC line are again identified and stable. Bilateral pleural effusions and vascular congestion with pulmonary edema are seen. The overall appearance is similar to that noted on the prior exam IMPRESSION: Changes of CHF with bilateral pleural effusions and parenchymal edema. Electronically Signed   By: Inez Catalina M.D.   On: 07/28/2015 07:27   Dg Chest Port 1 View  07/26/2015  CLINICAL DATA:  Follow-up pneumonia EXAM: PORTABLE CHEST 1 VIEW COMPARISON:  Portable chest x-ray of May 23, 2016 FINDINGS: The lungs are mildly hypoinflated. There is left lower lobe atelectasis or pneumonia with small left pleural effusion, stable. Mild infrahilar interstitial prominence on the right also stable. The cardiac silhouette remains enlarged. The central pulmonary vascularity is mildly prominent. The endotracheal tube tip lies 3.9 cm above the carina. The esophagogastric tube tip projects below the inferior margin of the image. The right-sided PICC line tip projects over the midportion of the SVC. IMPRESSION: Persistent left lower lobe atelectasis and small left pleural effusion. Mild central pulmonary vascular congestion with mild interstitial edema not significantly changed. The  support tubes are in reasonable position. Electronically Signed   By: David  Martinique M.D.   On: 07/26/2015 07:21   Dg Chest Port 1 View  07/25/2015  CLINICAL DATA:  75 year old male with sepsis. EXAM: PORTABLE CHEST 1 VIEW COMPARISON:  07/23/2015 and prior studies FINDINGS: The patient is rotated. An endotracheal tube with tip 4 cm above carina, right PICC line with tip overlying the superior cavoatrial junction, NG tube entering the stomach with tip off the field of view again noted. Persistent right lower lung airspace disease/ atelectasis, right perihilar opacity/atelectasis and left lower lung consolidations/atelectasis noted. Pulmonary vascular congestion has decreased. There may be trace pleural effusions. There is no evidence of pneumothorax. IMPRESSION: Decreased pulmonary vascular congestion without other significant change. Continued atelectasis/ airspace disease/ consolidation as described. Electronically Signed   By: Margarette Canada M.D.   On: 07/25/2015 07:26   Dg Chest Port 1 View  07/23/2015  CLINICAL DATA:  75 year old male with ventilator dependent respiratory failure. Sepsis. Initial encounter. EXAM: PORTABLE CHEST 1 VIEW COMPARISON:  07/22/2015 and earlier. FINDINGS: Portable AP semi upright view at 0405 hours. Stable endotracheal tube position. Enteric tube courses to the abdomen, tip not included. Stable right PICC line. Stable cardiomegaly and mediastinal contours. Interval decreased veiling opacity in the lungs but continued dense retrocardiac opacity at both bases. Partially visible spinal fusion hardware appears stable. No pneumothorax. No overt edema. IMPRESSION: 1.  Stable lines and tubes. 2. Bilateral pleural effusions are less apparent. No overt edema. Continued dense bilateral lower lobe collapse or consolidation. Electronically Signed   By: Genevie Ann M.D.   On: 07/23/2015 06:36   Dg Chest Port 1 View  07/22/2015  CLINICAL DATA:  Ventilator dependent respiratory failure, coronary  artery disease, sepsis, acute encephalopathy, left lower lobe pneumonia. EXAM: PORTABLE CHEST 1 VIEW COMPARISON:  Portable chest x-ray of July 21, 2015 FINDINGS: The lungs are reasonably well inflated. There are bilateral pleural effusions layering posteriorly. There is left lower lobe atelectasis or pneumonia. The cardiac silhouette remains enlarged. The pulmonary vascularity remains engorged. The pulmonary interstitial markings are slightly more conspicuous overall today. The endotracheal tube tip lies 4.7 cm above the carina. The esophagogastric tube tip projects below the inferior margin of the image. The PICC line tip projects over the midportion of the SVC. The patient has undergone lower thoracic lumbar an upper lumbar posterior fusion. IMPRESSION: CHF with mild pulmonary interstitial edema slightly more conspicuous today. Persistent left lower lobe atelectasis or pneumonia. Persistent bilateral pleural effusions layering posteriorly. The support tubes are in reasonable position. Electronically Signed   By: David  Martinique M.D.   On: 07/22/2015 08:33   Portable Chest Xray  07/21/2015  CLINICAL DATA:  Hypoxia EXAM: PORTABLE CHEST 1 VIEW COMPARISON:  July 21, 2015 FINDINGS: Endotracheal tube tip is 4.4 cm above the carina. Nasogastric tube tip and side port are below the diaphragm. Central catheter tip is in the superior vena cava. No pneumothorax. There is persistent pulmonary vascular congestion. There is increase in opacity in the left base region. There is postoperative change in the lower thoracic and visualized upper lumbar region. IMPRESSION: Tube and catheter positions as described without pneumothorax. Increased consolidation left base ; question developing pneumonia or possibly aspiration. Pulmonary vascular congestion with a degree of congestive heart failure remains without change. Electronically Signed   By: Lowella Grip III M.D.   On: 07/21/2015 11:14   Dg Chest Port 1  View  07/21/2015  CLINICAL DATA:  Decreased oxygen saturation EXAM: PORTABLE CHEST 1 VIEW COMPARISON:  July 19, 2015 FINDINGS: Central catheter tip is in the superior vena cava. No pneumothorax. Interstitial edema remains, stable. There is no airspace consolidation. There is cardiomegaly with mild pulmonary venous hypertension. No new opacity. No adenopathy. There is postoperative change in the lower thoracic and visualized upper lumbar region. IMPRESSION: Evidence of a degree of congestive heart failure, stable. No new opacity. No change in cardiac silhouette. Central catheter tip in superior vena cava. No pneumothorax evident. Electronically Signed   By: Lowella Grip III M.D.   On: 07/21/2015 07:52   Dg Chest Port 1 View  07/19/2015  CLINICAL DATA:  Respiratory distress EXAM: PORTABLE CHEST 1 VIEW COMPARISON:  07/18/2015 FINDINGS: Cardiomegaly with vascular congestion and diffuse interstitial prominence compatible with edema. Right PICC line is in place with the tip at the cavoatrial junction. Possible layering left effusion. No acute bony abnormality. IMPRESSION: Findings compatible with CHF.  Possible small left effusion. Electronically Signed   By: Rolm Baptise M.D.   On: 07/19/2015  13:40   Dg Chest Port 1 View  07/18/2015  CLINICAL DATA:  Line placement EXAM: PORTABLE CHEST 1 VIEW COMPARISON:  07/18/2015 FINDINGS: Right-sided PICC line has been placed, tip overlying the level of the superior vena cava. The heart is enlarged. There is pulmonary vascular congestion. Opacity at the medial left lung base is increased, consistent with developing infiltrate and/or atelectasis. IMPRESSION: 1. Right-sided PICC line tip to the superior vena cava. 2. Developing left lower lobe infiltrate and/or atelectasis. Electronically Signed   By: Nolon Nations M.D.   On: 07/18/2015 14:04   Dg Chest Port 1 View  07/18/2015  CLINICAL DATA:  Shortness of breath. EXAM: PORTABLE CHEST 1 VIEW COMPARISON:  July 17, 2015. FINDINGS: Stable cardiomegaly. No pneumothorax or pleural effusion is noted. Stable mild bibasilar subsegmental atelectasis is noted. Bony thorax is unremarkable. IMPRESSION: Stable mild bibasilar subsegmental atelectasis. Electronically Signed   By: Marijo Conception, M.D.   On: 07/18/2015 09:06   Dg Chest Port 1 View  07/17/2015  CLINICAL DATA:  Pt w/ back surgery x2 days ago, [facility] report temp of 102.0 on arrival and pt given 1gm APAP, pt also w/ SPO2 of 86% on room air, pt placed on simple mask at 6L/min and SPO2 up to 96%. Pt now under sepsis protocol and is experiencing acute confusion EXAM: PORTABLE CHEST 1 VIEW COMPARISON:  01/15/2015 FINDINGS: Heart is mildly enlarged. There are no focal consolidations or pleural effusions. There is mild left lower lobe subsegmental atelectasis or scarring. Less likely this could represent early infectious infiltrate. No evidence for pulmonary edema. Previous thoracic spine fusion. IMPRESSION: 1. Cardiomegaly without pulmonary edema. 2. Left lower lobe atelectasis versus early infiltrate. Followup is recommended. Electronically Signed   By: Nolon Nations M.D.   On: 07/17/2015 18:06   Dg Abd Portable 1v  07/30/2015  CLINICAL DATA:  NG tube placement EXAM: PORTABLE ABDOMEN - 1 VIEW COMPARISON:  Portable abdominal film of July 29, 2015 FINDINGS: The feeding tube is been removed and a nasogastric tube placed. However, the proximal port is at or above the GE junction and the tip lies in the cardia. Advancement by 15-20 cm at least is recommended. There is persistent left lower lobe atelectasis or pneumonia. The gas pattern in the upper abdomen is within the limits of normal. Metallic posterior fusion is devices are present from the lower thoracic spine throughout the lumbar spine. IMPRESSION: Advancement of the nasogastric tube by between 15 and 20 cm is needed to assure that the proximal port is positioned below the GE junction. Left lower lobe atelectasis or  pneumonia. Electronically Signed   By: David  Martinique M.D.   On: 07/30/2015 08:01   Dg Abd Portable 1v  07/29/2015  CLINICAL DATA:  Encounter for feeding tube placement. EXAM: PORTABLE ABDOMEN - 1 VIEW COMPARISON:  None. FINDINGS: The bowel gas pattern is normal. Status post surgical fusion of lower thoracic and lumbar spine. Distal tip of feeding tube is seen in expected position of second portion of duodenum. IMPRESSION: Distal tip of feeding tube seen in expected position of second portion of duodenum. Electronically Signed   By: Marijo Conception, M.D.   On: 07/29/2015 15:08   Dg Swallowing Func-speech Pathology  08/02/2015  Objective Swallowing Evaluation: Type of Study: MBS-Modified Barium Swallow Study Patient Details Name: LAO VARCOE MRN: [id number] Date of Birth: [birth year] Today's Date: 08/02/2015 Time: SLP Start Time (ACUTE ONLY): 1316-SLP Stop Time (ACUTE ONLY): 1332 SLP Time Calculation (min) (ACUTE  ONLY): 16 min Past Medical History: Past Medical History Diagnosis Date . Other and unspecified hyperlipidemia  . Coronary atherosclerosis of unspecified type of vessel, native or graft  . Personal history of colonic polyps  . Actinic keratosis  . Psoriatic arthropathy ([place])  . Other psoriasis  . Scoliosis (and kyphoscoliosis), idiopathic  . GI bleed 12/10   cecam AVM . Gastritis 12/10 . Hyperlipidemia    takes Pravastatin daily . Hypertension    takes Metoprolol daily . Myocardial infarction ([place]) 2010 . Shortness of breath    with exertion . Pneumonia    hx of-2008 . Headache(784.0)    occasionally . Joint pain  . Joint swelling  . Rheumatoid arthritis(714.0)    takes Methotrexate 7pills weekly . Degeneration of intervertebral disc, site unspecified  . Chronic back pain    scoliosis/stenosis/radiculopathy,degenerative disc disease . Psoriasis  . Bruises easily  . Esophageal reflux    takes Omeprazole daily . History of colonic polyps  . Hemorrhoids  . Kidney stone    hx of . Blood transfusion 2010 . Type  II or unspecified type diabetes mellitus without mention of complication, not stated as uncontrolled    type II;controlled by diet and exercise . PMR (polymyalgia rheumatica) ([facility]) 01/20/2012   tx by specialist on a long prednisone taper . S/P PTCA (percutaneous transluminal coronary angioplasty)  Past Surgical History: Past Surgical History Procedure Laterality Date . Rotator cuff repair Left 2000 . Colonoscopy   . Vasectomy  1979 . Coronary angioplasty with stent placement  02/2009   has one stent . Carotid doppler  10/12   0-39% R and 60-79% left  . Cataract extraction  2012 . Cardiac catheterization  2010 . Lithotripsy  2009 HPI: 75 y.o. male with h/o HLD, CAD, scoliosis, GI bleed, HTN, myocardial infarction (2010< SOB, PNA, esophageal reflux and type 2 DM with recent laminectomy who presented to ED with c/o confusion and fever. CXR 2/4 revealed LLL atelectasis vs. early infiltrate. Pt intubated 2/8-2/15 (8 days). CXR 2/17 LLL atelectasis or pneumonia. Pt has been seen by ST since extubation and is appropriate for objective assessment. Subjective: The patient was seen with his breakfast tray sitting in his bedside chair.  Assessment / Plan / Recommendation CHL IP CLINICAL IMPRESSIONS 08/02/2015 Therapy Diagnosis Mild pharyngeal phase dysphagia;Moderate pharyngeal phase dysphagia;Mild oral phase dysphagia Clinical Impression Pt exhibited mild oral phase dysphagia and mild-mod pharyngeal phase dysphagia. Delayed oral transit with thicker consistencies and delayed swallow initiation to the vallecula observed. Reduced laryngeal elevation and closure resulted in penetration during the swallow with honey thick liquid, however administration of liquid via teaspoon appeared to prevent further penetration events. Mod-max vallecular residue occured due to decreased base of tongue retraction. SLP provided mod verbal cues to swallow x2 which was effective to decrease amount. Decreased apneic period and increased effort  required during mastication results in dyspnea. Pt and daughter educated re: results of MBSS and diet recommendation and decreased respiratory status leading to higher aspiration risk. Recommend Dysphagia 1 (puree) diet, honey thick liquids, meds crushed in puree, swallow x2 after each bite and full supervision. SLP will f/u to determine safety with recommended diet/liquids. Impact on safety and function Severe aspiration risk   CHL IP TREATMENT RECOMMENDATION 08/02/2015 Treatment Recommendations Therapy as outlined in treatment plan below   Prognosis 08/02/2015 Prognosis for Safe Diet Advancement Good Barriers to Reach Goals Severity of deficits Barriers/Prognosis Comment -- CHL IP DIET RECOMMENDATION 08/02/2015 SLP Diet Recommendations Dysphagia 1 (Puree) solids;Honey thick liquids Liquid  Administration via [employer] Medication Administration Crushed with puree Compensations Minimize environmental distractions;Slow rate;Small sips/bites;Multiple dry swallows after each bite/sip Postural Changes Seated upright at 90 degrees   CHL IP OTHER RECOMMENDATIONS 08/02/2015 Recommended Consults -- Oral Care Recommendations Oral care BID Other Recommendations Have oral suction available;Order thickener from pharmacy;Prohibited food (jello, ice cream, thin soups)   CHL IP FOLLOW UP RECOMMENDATIONS 08/02/2015 Follow up Recommendations Inpatient Rehab   CHL IP FREQUENCY AND DURATION 08/02/2015 Speech Therapy Frequency (ACUTE ONLY) min 2x/week Treatment Duration 2 weeks      CHL IP ORAL PHASE 08/02/2015 Oral Phase Impaired Oral - Pudding Teaspoon -- Oral - Pudding Cup -- Oral - Honey Teaspoon Delayed oral transit Oral - Honey Cup Delayed oral transit Oral - Nectar Teaspoon WFL Oral - Nectar Cup -- Oral - Nectar Straw -- Oral - Thin Teaspoon -- Oral - Thin Cup -- Oral - Thin Straw -- Oral - Puree Delayed oral transit Oral - Mech Soft Delayed oral transit Oral - Regular -- Oral - Multi-Consistency -- Oral - Pill -- Oral Phase - Comment --   CHL IP PHARYNGEAL PHASE 08/02/2015 Pharyngeal Phase Impaired Pharyngeal- Pudding Teaspoon -- Pharyngeal -- Pharyngeal- Pudding Cup -- Pharyngeal -- Pharyngeal- Honey Teaspoon Penetration/Aspiration during swallow;Delayed swallow initiation-vallecula;Pharyngeal residue - valleculae;Reduced airway/laryngeal closure;Reduced tongue base retraction Pharyngeal Material enters airway, remains ABOVE vocal cords then ejected out Pharyngeal- Honey Cup Delayed swallow initiation-vallecula;Pharyngeal residue - valleculae;Penetration/Aspiration during swallow;Reduced laryngeal elevation;Reduced airway/laryngeal closure;Reduced epiglottic inversion;Reduced tongue base retraction Pharyngeal Material enters airway, CONTACTS cords and not ejected out Pharyngeal- Nectar Teaspoon Delayed swallow initiation-pyriform sinuses;Pharyngeal residue - valleculae Pharyngeal -- Pharyngeal- Nectar Cup -- Pharyngeal -- Pharyngeal- Nectar Straw -- Pharyngeal -- Pharyngeal- Thin Teaspoon -- Pharyngeal -- Pharyngeal- Thin Cup -- Pharyngeal -- Pharyngeal- Thin Straw -- Pharyngeal -- Pharyngeal- Puree Delayed swallow initiation-vallecula;Pharyngeal residue - valleculae Pharyngeal -- Pharyngeal- Mechanical Soft Pharyngeal residue - valleculae;Delayed swallow initiation-vallecula Pharyngeal -- Pharyngeal- Regular -- Pharyngeal -- Pharyngeal- Multi-consistency -- Pharyngeal -- Pharyngeal- Pill -- Pharyngeal -- Pharyngeal Comment --  CHL IP CERVICAL ESOPHAGEAL PHASE 08/02/2015 Cervical Esophageal Phase WFL Pudding Teaspoon -- Pudding Cup -- Honey Teaspoon -- Honey Cup -- Nectar Teaspoon -- Nectar Cup -- Nectar Straw -- Thin Teaspoon -- Thin Cup -- Thin Straw -- Puree -- Mechanical Soft -- Regular -- Multi-consistency -- Pill -- Cervical Esophageal Comment -- CHL IP GO 07/18/2015 Functional Assessment Tool Used ASHA NOMS and clinical judgment.   Functional Limitations Swallowing Swallow Current Status [phone]) CI Swallow Goal Status [id number]) CI Swallow Discharge  Status [phone]) (None) Motor Speech Current Status [phone]) (None) Motor Speech Goal Status [id number]) (None) Motor Speech Goal Status [phone]) (None) Spoken Language Comprehension Current Status [phone]) (None) Spoken Language Comprehension Goal Status [id number]) (None) Spoken Language Comprehension Discharge Status [phone]) (None) Spoken Language Expression Current Status [phone]) (None) Spoken Language Expression Goal Status [phone]) (None) Spoken Language Expression Discharge Status [phone]) (None) Attention Current Status [id number]) (None) Attention Goal Status [id number]) (None) Attention Discharge Status [phone]) (None) Memory Current Status [id number]) (None) Memory Goal Status [id number]) (None) Memory Discharge Status [id number]) (None) Voice Current Status [id number]) (None) Voice Goal Status [id number]) (None) Voice Discharge Status [id number]) (None) Other Speech-Language Pathology Functional Limitation [phone]) (None) Other Speech-Language Pathology Functional Limitation Goal Status [id number]) (None) Other Speech-Language Pathology Functional Limitation Discharge Status [phone]) (None) [place] Siren 08/02/2015, 2:36 PM Orbie Pyo Colvin Caroli.Ed CCC-SLP Pager [phone]               Microbiology: Recent Results (from the past 240  hour(s))  Culture, Urine     Status: None   Collection Time: 08/11/15  2:06 PM  Result Value Ref Range Status   Specimen Description URINE, CLEAN CATCH  Final   Special Requests NONE  Final   Culture NO GROWTH 1 DAY  Final   Report Status 08/12/2015 FINAL  Final  Culture, blood (Routine X 2) w Reflex to ID Panel     Status: None (Preliminary result)   Collection Time: 08/11/15  2:40 PM  Result Value Ref Range Status   Specimen Description BLOOD LEFT ARM  Final   Special Requests IN PEDIATRIC BOTTLE 3CC  Final   Culture NO GROWTH 4 DAYS  Final   Report Status PENDING  Incomplete  Culture, blood (Routine X 2) w Reflex to ID Panel     Status: None (Preliminary result)   Collection Time: 08/11/15  2:45 PM   Result Value Ref Range Status   Specimen Description BLOOD LEFT HAND  Final   Special Requests IN PEDIATRIC BOTTLE 2CC  Final   Culture NO GROWTH 4 DAYS  Final   Report Status PENDING  Incomplete     Labs: Basic Metabolic Panel:  Recent Labs Lab 08/11/15 1440 08/12/15 0635 08/13/15 0430 08/14/15 0459 08/15/15 0424 08/16/15 0510  NA 150* 135 147* 147* 143 141  K 4.1 3.7 3.5 3.6 3.6 3.8  CL 115* 105 114* 112* 106 104  CO2 24 24 24 27 27 26   GLUCOSE 236* 582* 250* 214* 169* 109*  BUN 44* 38* 30* 19 13 12   CREATININE 2.12* 1.65* 1.34* 1.14 0.99 1.09  CALCIUM 7.9* 7.2* 7.7* 8.2* 8.0* 8.5*  MG 1.8  --   --   --   --   --    Liver Function Tests:  Recent Labs Lab 08/10/15 1940  AST 21  ALT 25  ALKPHOS 84  BILITOT 1.3*  PROT 6.4*  ALBUMIN 2.4*   No results for input(s): LIPASE, AMYLASE in the last 168 hours.  Recent Labs Lab 08/10/15 1940  AMMONIA 15   CBC:  Recent Labs Lab 08/10/15 1940 08/11/15 1440 08/12/15 0433 08/13/15 0430 08/15/15 0424  WBC 8.0 9.4 7.0 7.7 9.1  NEUTROABS 6.6  --   --   --   --   HGB 11.3* 10.5* 8.9* 9.3* 9.8*  HCT 37.5*  35.9* 35.4* 29.2* 30.8* 31.5*  MCV 101.1* 99.7 98.3 99.0 97.8  PLT 202 216 186 207 249   Cardiac Enzymes:  Recent Labs Lab 08/11/15 1440  TROPONINI 0.05*   BNP: BNP (last 3 results)  Recent Labs  08/01/15 0022 08/09/15 0540 08/12/15 1040  BNP 881.4* 357.5* 222.8*    ProBNP (last 3 results) No results for input(s): PROBNP in the last 8760 hours.  CBG:  Recent Labs Lab 08/15/15 0625 08/15/15 1119 08/15/15 1626 08/15/15 2106 08/16/15 0619  GLUCAP 145* 168* 274* 222* 93       Signed:  Delfina Redwood MD [facility] 08/16/2015, 10:53 AM

## 2015-08-17 ENCOUNTER — Inpatient Hospital Stay (HOSPITAL_COMMUNITY)
Admission: AD | Admit: 2015-08-17 | Discharge: 2015-09-02 | DRG: 071 | Disposition: A | Discharge status: Home Health | Payer: Medicare Other | Source: Intra-hospital | Attending: Physical Medicine & Rehabilitation | Admitting: Physical Medicine & Rehabilitation

## 2015-08-17 ENCOUNTER — Inpatient Hospital Stay (HOSPITAL_COMMUNITY): Payer: Medicare Other

## 2015-08-17 DIAGNOSIS — Z888 Allergy status to other drugs, medicaments and biological substances status: Secondary | ICD-10-CM

## 2015-08-17 DIAGNOSIS — E1022 Type 1 diabetes mellitus with diabetic chronic kidney disease: Secondary | ICD-10-CM | POA: Insufficient documentation

## 2015-08-17 DIAGNOSIS — E1122 Type 2 diabetes mellitus with diabetic chronic kidney disease: Secondary | ICD-10-CM | POA: Diagnosis present

## 2015-08-17 DIAGNOSIS — Z955 Presence of coronary angioplasty implant and graft: Secondary | ICD-10-CM

## 2015-08-17 DIAGNOSIS — G9341 Metabolic encephalopathy: Secondary | ICD-10-CM | POA: Diagnosis present

## 2015-08-17 DIAGNOSIS — D638 Anemia in other chronic diseases classified elsewhere: Secondary | ICD-10-CM | POA: Diagnosis present

## 2015-08-17 DIAGNOSIS — R1310 Dysphagia, unspecified: Secondary | ICD-10-CM | POA: Diagnosis present

## 2015-08-17 DIAGNOSIS — Z881 Allergy status to other antibiotic agents status: Secondary | ICD-10-CM | POA: Diagnosis not present

## 2015-08-17 DIAGNOSIS — E870 Hyperosmolality and hypernatremia: Secondary | ICD-10-CM

## 2015-08-17 DIAGNOSIS — Z981 Arthrodesis status: Secondary | ICD-10-CM | POA: Diagnosis not present

## 2015-08-17 DIAGNOSIS — Z880 Allergy status to penicillin: Secondary | ICD-10-CM

## 2015-08-17 DIAGNOSIS — N179 Acute kidney failure, unspecified: Secondary | ICD-10-CM | POA: Diagnosis present

## 2015-08-17 DIAGNOSIS — Z87891 Personal history of nicotine dependence: Secondary | ICD-10-CM | POA: Diagnosis not present

## 2015-08-17 DIAGNOSIS — K219 Gastro-esophageal reflux disease without esophagitis: Secondary | ICD-10-CM | POA: Diagnosis present

## 2015-08-17 DIAGNOSIS — R791 Abnormal coagulation profile: Secondary | ICD-10-CM | POA: Insufficient documentation

## 2015-08-17 DIAGNOSIS — R5381 Other malaise: Secondary | ICD-10-CM | POA: Diagnosis present

## 2015-08-17 DIAGNOSIS — R7309 Other abnormal glucose: Secondary | ICD-10-CM | POA: Insufficient documentation

## 2015-08-17 DIAGNOSIS — I4891 Unspecified atrial fibrillation: Secondary | ICD-10-CM | POA: Diagnosis present

## 2015-08-17 DIAGNOSIS — M353 Polymyalgia rheumatica: Secondary | ICD-10-CM | POA: Diagnosis present

## 2015-08-17 DIAGNOSIS — E785 Hyperlipidemia, unspecified: Secondary | ICD-10-CM | POA: Diagnosis present

## 2015-08-17 DIAGNOSIS — Z79899 Other long term (current) drug therapy: Secondary | ICD-10-CM | POA: Diagnosis not present

## 2015-08-17 DIAGNOSIS — Z794 Long term (current) use of insulin: Secondary | ICD-10-CM | POA: Diagnosis not present

## 2015-08-17 DIAGNOSIS — M069 Rheumatoid arthritis, unspecified: Secondary | ICD-10-CM | POA: Diagnosis present

## 2015-08-17 DIAGNOSIS — M549 Dorsalgia, unspecified: Secondary | ICD-10-CM | POA: Diagnosis present

## 2015-08-17 DIAGNOSIS — M47816 Spondylosis without myelopathy or radiculopathy, lumbar region: Secondary | ICD-10-CM | POA: Diagnosis not present

## 2015-08-17 DIAGNOSIS — Z7984 Long term (current) use of oral hypoglycemic drugs: Secondary | ICD-10-CM

## 2015-08-17 DIAGNOSIS — N189 Chronic kidney disease, unspecified: Secondary | ICD-10-CM | POA: Diagnosis present

## 2015-08-17 DIAGNOSIS — R4189 Other symptoms and signs involving cognitive functions and awareness: Secondary | ICD-10-CM | POA: Diagnosis present

## 2015-08-17 DIAGNOSIS — E119 Type 2 diabetes mellitus without complications: Secondary | ICD-10-CM

## 2015-08-17 DIAGNOSIS — M25551 Pain in right hip: Secondary | ICD-10-CM | POA: Diagnosis present

## 2015-08-17 DIAGNOSIS — R05 Cough: Secondary | ICD-10-CM | POA: Diagnosis not present

## 2015-08-17 DIAGNOSIS — I252 Old myocardial infarction: Secondary | ICD-10-CM | POA: Diagnosis not present

## 2015-08-17 DIAGNOSIS — G8929 Other chronic pain: Secondary | ICD-10-CM | POA: Diagnosis present

## 2015-08-17 DIAGNOSIS — G9340 Encephalopathy, unspecified: Secondary | ICD-10-CM | POA: Diagnosis present

## 2015-08-17 DIAGNOSIS — I2510 Atherosclerotic heart disease of native coronary artery without angina pectoris: Secondary | ICD-10-CM | POA: Diagnosis present

## 2015-08-17 DIAGNOSIS — R059 Cough, unspecified: Secondary | ICD-10-CM | POA: Insufficient documentation

## 2015-08-17 DIAGNOSIS — M25559 Pain in unspecified hip: Secondary | ICD-10-CM

## 2015-08-17 DIAGNOSIS — N182 Chronic kidney disease, stage 2 (mild): Secondary | ICD-10-CM

## 2015-08-17 LAB — FUNGUS CULTURE W SMEAR
Fungal Smear: NONE SEEN
SPECIAL REQUESTS: NORMAL

## 2015-08-17 LAB — GLUCOSE, CAPILLARY
GLUCOSE-CAPILLARY: 106 mg/dL — AB (ref 65–99)
GLUCOSE-CAPILLARY: 233 mg/dL — AB (ref 65–99)
GLUCOSE-CAPILLARY: 235 mg/dL — AB (ref 65–99)
Glucose-Capillary: 254 mg/dL — ABNORMAL HIGH (ref 65–99)

## 2015-08-17 LAB — BASIC METABOLIC PANEL
ANION GAP: 8 (ref 5–15)
BUN: 15 mg/dL (ref 6–20)
CALCIUM: 8.9 mg/dL (ref 8.9–10.3)
CO2: 27 mmol/L (ref 22–32)
CREATININE: 1.1 mg/dL (ref 0.61–1.24)
Chloride: 104 mmol/L (ref 101–111)
Glucose, Bld: 141 mg/dL — ABNORMAL HIGH (ref 65–99)
Potassium: 4.1 mmol/L (ref 3.5–5.1)
Sodium: 139 mmol/L (ref 135–145)

## 2015-08-17 LAB — PROTIME-INR
INR: 2.28 — AB (ref 0.00–1.49)
PROTHROMBIN TIME: 24.9 s — AB (ref 11.6–15.2)

## 2015-08-17 MED ORDER — WARFARIN - PHYSICIAN DOSING INPATIENT: Freq: Every day | Status: DC

## 2015-08-17 MED ORDER — SODIUM CHLORIDE 0.9% FLUSH
10.0000 mL | Freq: Two times a day (BID) | INTRAVENOUS | Status: DC
  Administered 2015-08-20 – 2015-08-21 (×2): 10 mL
  Administered 2015-08-22: 20 mL

## 2015-08-17 MED ORDER — VITAMIN B-1 100 MG PO TABS
100.0000 mg | ORAL_TABLET | Freq: Every day | ORAL | Status: DC
  Administered 2015-08-18 – 2015-09-02 (×16): 100 mg via ORAL
  Filled 2015-08-17 (×16): qty 1

## 2015-08-17 MED ORDER — SODIUM CHLORIDE 0.9 % IV SOLN
INTRAVENOUS | Status: DC
  Administered 2015-08-17 – 2015-08-18 (×2): via INTRAVENOUS
  Filled 2015-08-17: qty 1000

## 2015-08-17 MED ORDER — INSULIN GLARGINE 100 UNIT/ML ~~LOC~~ SOLN
10.0000 [IU] | Freq: Every day | SUBCUTANEOUS | Status: DC
  Administered 2015-08-17 – 2015-08-22 (×6): 10 [IU] via SUBCUTANEOUS
  Filled 2015-08-17 (×8): qty 0.1

## 2015-08-17 MED ORDER — FERROUS SULFATE 325 (65 FE) MG PO TABS
325.0000 mg | ORAL_TABLET | Freq: Three times a day (TID) | ORAL | Status: DC
  Administered 2015-08-18 – 2015-09-02 (×46): 325 mg via ORAL
  Filled 2015-08-17 (×47): qty 1

## 2015-08-17 MED ORDER — ALUM & MAG HYDROXIDE-SIMETH 200-200-20 MG/5ML PO SUSP: 30.0000 mL | ORAL | Status: DC | PRN

## 2015-08-17 MED ORDER — DIPHENHYDRAMINE HCL 12.5 MG/5ML PO ELIX: 12.5000 mg | ORAL_SOLUTION | Freq: Four times a day (QID) | ORAL | Status: DC | PRN

## 2015-08-17 MED ORDER — PANTOPRAZOLE SODIUM 40 MG PO TBEC
40.0000 mg | DELAYED_RELEASE_TABLET | Freq: Every day | ORAL | Status: DC
Start: 2015-08-17 — End: 2015-09-02
  Administered 2015-08-17 – 2015-09-02 (×17): 40 mg via ORAL
  Filled 2015-08-17 (×17): qty 1

## 2015-08-17 MED ORDER — SENNOSIDES-DOCUSATE SODIUM 8.6-50 MG PO TABS: 1.0000 | ORAL_TABLET | Freq: Every evening | ORAL | Status: DC | PRN

## 2015-08-17 MED ORDER — ACETAMINOPHEN 325 MG PO TABS
650.0000 mg | ORAL_TABLET | Freq: Four times a day (QID) | ORAL | Status: DC | PRN
Start: 2015-08-17 — End: 2015-09-02
  Administered 2015-08-30: 650 mg via ORAL
  Filled 2015-08-17: qty 2

## 2015-08-17 MED ORDER — WARFARIN - PHARMACIST DOSING INPATIENT
Freq: Every day | Status: DC
  Administered 2015-08-24 – 2015-09-01 (×2)

## 2015-08-17 MED ORDER — ATORVASTATIN CALCIUM 20 MG PO TABS
20.0000 mg | ORAL_TABLET | Freq: Every day | ORAL | Status: DC
  Administered 2015-08-18 – 2015-09-01 (×15): 20 mg via ORAL
  Filled 2015-08-17 (×15): qty 1

## 2015-08-17 MED ORDER — AMIODARONE HCL 200 MG PO TABS
400.0000 mg | ORAL_TABLET | Freq: Every day | ORAL | Status: DC
  Administered 2015-08-17: 400 mg via ORAL
  Filled 2015-08-17: qty 2

## 2015-08-17 MED ORDER — INSULIN ASPART 100 UNIT/ML ~~LOC~~ SOLN
0.0000 [IU] | Freq: Three times a day (TID) | SUBCUTANEOUS | Status: DC
Start: 2015-08-18 — End: 2015-09-02
  Administered 2015-08-18 (×2): 5 [IU] via SUBCUTANEOUS
  Administered 2015-08-18: 2 [IU] via SUBCUTANEOUS
  Administered 2015-08-19: 8 [IU] via SUBCUTANEOUS
  Administered 2015-08-19: 5 [IU] via SUBCUTANEOUS
  Administered 2015-08-20: 11 [IU] via SUBCUTANEOUS
  Administered 2015-08-20 – 2015-08-22 (×4): 3 [IU] via SUBCUTANEOUS
  Administered 2015-08-22: 8 [IU] via SUBCUTANEOUS
  Administered 2015-08-23 – 2015-08-24 (×3): 3 [IU] via SUBCUTANEOUS
  Administered 2015-08-24: 5 [IU] via SUBCUTANEOUS
  Administered 2015-08-25 – 2015-08-26 (×4): 2 [IU] via SUBCUTANEOUS
  Administered 2015-08-26: 3 [IU] via SUBCUTANEOUS
  Administered 2015-08-27: 2 [IU] via SUBCUTANEOUS
  Administered 2015-08-28: 5 [IU] via SUBCUTANEOUS
  Administered 2015-08-29 – 2015-08-30 (×3): 2 [IU] via SUBCUTANEOUS
  Administered 2015-08-30 – 2015-08-31 (×2): 3 [IU] via SUBCUTANEOUS
  Administered 2015-09-01: 5 [IU] via SUBCUTANEOUS

## 2015-08-17 MED ORDER — BISACODYL 10 MG RE SUPP: 10.0000 mg | Freq: Every day | RECTAL | Status: DC | PRN

## 2015-08-17 MED ORDER — GLUCERNA SHAKE PO LIQD
237.0000 mL | Freq: Three times a day (TID) | ORAL | Status: DC
  Administered 2015-08-18 – 2015-08-30 (×19): 237 mL via ORAL

## 2015-08-17 MED ORDER — WARFARIN SODIUM 5 MG PO TABS
5.0000 mg | ORAL_TABLET | Freq: Every day | ORAL | Status: DC
  Administered 2015-08-17: 5 mg via ORAL
  Filled 2015-08-17: qty 1

## 2015-08-17 MED ORDER — SODIUM CHLORIDE 0.9% FLUSH
10.0000 mL | INTRAVENOUS | Status: DC | PRN
  Administered 2015-08-19: 10 mL
  Administered 2015-08-20 – 2015-08-24 (×5): 20 mL
  Administered 2015-08-25 (×2): 10 mL
  Filled 2015-08-17 (×8): qty 40

## 2015-08-17 MED ORDER — PREDNISONE 5 MG PO TABS
10.0000 mg | ORAL_TABLET | Freq: Every day | ORAL | Status: DC
Start: 2015-08-18 — End: 2015-09-02
  Administered 2015-08-18 – 2015-09-02 (×16): 10 mg via ORAL
  Filled 2015-08-17 (×16): qty 2

## 2015-08-17 MED ORDER — CETYLPYRIDINIUM CHLORIDE 0.05 % MT LIQD
7.0000 mL | Freq: Two times a day (BID) | OROMUCOSAL | Status: DC
  Administered 2015-08-17 – 2015-09-02 (×24): 7 mL via OROMUCOSAL

## 2015-08-17 MED ORDER — GUAIFENESIN-DM 100-10 MG/5ML PO SYRP: 5.0000 mL | ORAL_SOLUTION | Freq: Four times a day (QID) | ORAL | Status: DC | PRN

## 2015-08-17 MED ORDER — SENNA 8.6 MG PO TABS
2.0000 | ORAL_TABLET | Freq: Every day | ORAL | Status: DC
  Administered 2015-08-18 – 2015-09-02 (×14): 17.2 mg via ORAL
  Filled 2015-08-17 (×16): qty 2

## 2015-08-17 MED ORDER — ONDANSETRON HCL 4 MG/2ML IJ SOLN: 4.0000 mg | Freq: Four times a day (QID) | INTRAMUSCULAR | Status: DC | PRN

## 2015-08-17 MED ORDER — ONDANSETRON HCL 4 MG PO TABS: 4.0000 mg | ORAL_TABLET | Freq: Four times a day (QID) | ORAL | Status: DC | PRN

## 2015-08-17 MED ORDER — TRAZODONE HCL 50 MG PO TABS: 25.0000 mg | ORAL_TABLET | Freq: Every evening | ORAL | Status: DC | PRN

## 2015-08-17 MED ORDER — ALBUTEROL SULFATE (2.5 MG/3ML) 0.083% IN NEBU: 2.5000 mg | INHALATION_SOLUTION | Freq: Four times a day (QID) | RESPIRATORY_TRACT | Status: DC | PRN

## 2015-08-17 MED ORDER — RESOURCE THICKENUP CLEAR PO POWD
ORAL | Status: DC | PRN
  Filled 2015-08-17: qty 125

## 2015-08-17 MED ORDER — AMIODARONE HCL 200 MG PO TABS
400.0000 mg | ORAL_TABLET | Freq: Every day | ORAL | Status: DC
  Administered 2015-08-18 – 2015-09-02 (×16): 400 mg via ORAL
  Filled 2015-08-17 (×18): qty 2

## 2015-08-17 MED ORDER — INSULIN ASPART 100 UNIT/ML ~~LOC~~ SOLN
0.0000 [IU] | Freq: Every day | SUBCUTANEOUS | Status: DC
  Administered 2015-08-17: 2 [IU] via SUBCUTANEOUS
  Administered 2015-08-21: 3 [IU] via SUBCUTANEOUS
  Administered 2015-08-24: 2 [IU] via SUBCUTANEOUS

## 2015-08-17 MED ORDER — FLEET ENEMA 7-19 GM/118ML RE ENEM: 1.0000 | ENEMA | Freq: Once | RECTAL | Status: DC | PRN

## 2015-08-17 NOTE — Progress Notes (Signed)
Inpatient Rehabilitation  I have bed availability to admit pt. To CIR today.  Pt. And daughter agreeable.  I have updated pt's RN , Jonnie Finner, RNCM and Cedric Fishman, CSW (via text message).  Dr. Conley Canal is agreeable.  I will make arrangements for admission to rehab later this afternoon.  Please call if questions.  [place] Admissions Coordinator Cell [phone] Office [phone]

## 2015-08-17 NOTE — Care Management Note (Signed)
Case Management Note  Patient Details  Name: SKYLOR GALLAGHER MRN: [id number] Date of Birth: 05/03/1941                Action/Plan: Spoke to IP rehab coordinator, and bed available. Scheduled dc to IP rehab today.   Expected Discharge Date:  08/17/2015              Expected Discharge Plan:  [place]  In-House Referral:  NA  Discharge planning Services  CM Consult  Post Acute Care Choice:  NA Choice offered to:  NA  DME Arranged:  N/A DME Agency:  NA  HH Arranged:  NA HH Agency:  NA  Status of Service:  Completed, signed off  Medicare Important Message Given:  Yes Date Medicare IM Given:    Medicare IM give by:    Date Additional Medicare IM Given:    Additional Medicare Important Message give by:     If discussed at [place] of Stay Meetings, dates discussed:    Additional Comments:  Erenest Rasher, RN 08/17/2015, 11:51 AM
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
## 2015-08-17 NOTE — Care Management Important Message (Signed)
Important Message  Patient Details  Name: Curtis Frazier MRN: [id number] Date of Birth: August 15, 1940   Medicare Important Message Given:  Yes    Loann Quill 08/17/2015, 8:27 AM

## 2015-08-17 NOTE — Interval H&P Note (Signed)
Curtis Frazier was admitted today to Inpatient Rehabilitation with the diagnosis of debility/encephalopathy.  The patient's history has been reviewed, patient examined, and there is no change in status.  Patient continues to be appropriate for intensive inpatient rehabilitation.  I have reviewed the patient's chart and labs.  Questions were answered to the patient's satisfaction. The PAPE has been reviewed and assessment remains appropriate.  SWARTZ,ZACHARY T 08/17/2015, 5:59 PM

## 2015-08-17 NOTE — H&P (Signed)
  Physical Medicine and Rehabilitation Admission H&P    CC: Encephalopathy and multiple medical issues after lumbar disectomy   HPI:  Curtis Frazier is a 75 y.o. male with history of CAD, psoriatic arthropathy, DMT2, RA, lumbar stenosis with outpatient lumbar discectomy on 07/15/15 who was admitted on 02/4 with sepsis due to PNA, mental status changes and A fib with RVR. hospital course complicated by VDRF, acute on chronic CHF, cardioversion for management of A fib, hypoxia requiring face mask as well as confusion with encephalopathy. He was transfered to [facility] on 02/21 but re-admitted to acute hospital on 02/28 with ongoing encephalopathy, worsening of lethargy, poor po intake and acute on chronic renal failure with hypernatremia. He was started on IV antibiotics as well as IVF due to hypotension/sepsis. Neurology consulted for input on confusion and asterixis and recommended MRI brain as well as EEG for workup. EEG done revealing mild slowing but no seizures. MRI of brain done revealing atrophy and small vessel disease with chronic and question acute right maxillary disease. Encephalopathy felt to be due to metabolic derangement.   BP medications held due to orthostatic changes, acute renal failure has resolved and mentation is slowly improving. Follow up CXR 3/3 shows no edema or infiltrate and he has been weaned off oxygen. FEES done 3/2 and dysphagia 1, honey liquids by spoon recommended for moderate pharyngeal dysphagia as well a pharyngeal strengthening  exercises.  Back wound dehiscence noted on 03/06 and Dr. Jenkins recommends daily dressing changes with follow up in 2 weeks. Therapy ongoing and CIR recommended for follow up therapy.    Review of Systems  Constitutional: Negative for malaise/fatigue.  HENT: Negative for hearing loss.   Eyes: Negative for double vision.       Hard to see TV without glasses--new  Respiratory: Positive for cough (feels some tightness in upper chest).  Negative for sputum production and shortness of breath.   Cardiovascular: Negative for chest pain, palpitations and leg swelling.  Gastrointestinal: Negative for heartburn, abdominal pain and constipation.  Genitourinary: Positive for frequency. Negative for dysuria and urgency.  Musculoskeletal: Negative for myalgias and back pain.  Skin: Negative for itching and rash.  Neurological: Positive for dizziness (with activity). Negative for sensory change and headaches.  Psychiatric/Behavioral: The patient is not nervous/anxious and does not have insomnia.       Past Medical History  Diagnosis Date  . Other and unspecified hyperlipidemia   . Coronary atherosclerosis of unspecified type of vessel, native or graft   . Personal history of colonic polyps   . Actinic keratosis   . Psoriatic arthropathy (HCC)   . Other psoriasis   . Scoliosis (and kyphoscoliosis), idiopathic   . GI bleed 12/10    cecam AVM  . Gastritis 12/10  . Hyperlipidemia     takes Pravastatin daily  . Hypertension     takes Metoprolol daily  . Shortness of breath     with exertion  . Headache(784.0)     occasionally  . Joint pain   . Joint swelling   . Chronic back pain     scoliosis/stenosis/radiculopathy,degenerative disc disease  . Psoriasis   . Bruises easily   . Esophageal reflux     takes Omeprazole daily  . History of colonic polyps   . Hemorrhoids   . Kidney stone     hx of  . Blood transfusion 2010  . PMR (polymyalgia rheumatica) (HCC) 01/20/2012    tx by specialist on a long prednisone   taper  . S/P PTCA (percutaneous transluminal coronary angioplasty)   . Rheumatoid arthritis(714.0)     takes Methotrexate 7pills weekly  . Degeneration of intervertebral disc, site unspecified   . Myocardial infarction (HCC) 2010  . Pneumonia 2008; 07/2015  . Type II or unspecified type diabetes mellitus without mention of complication, not stated as uncontrolled   . Skin cancer     "arms"    Past Surgical  History  Procedure Laterality Date  . Rotator cuff repair Left 2000  . Colonoscopy    . Vasectomy  1979  . Coronary angioplasty with stent placement  02/2009    has one stent  . Carotid doppler  10/12    0-39% R and 60-79% left   . Cataract extraction  2012  . Cardiac catheterization  2010  . Lithotripsy  2009  . Tee without cardioversion N/A 08/06/2015    Procedure: TRANSESOPHAGEAL ECHOCARDIOGRAM (TEE);  Surgeon: Tiffany Spokane, MD;  Location: [facility];  Service: Cardiovascular;  Laterality: N/A;  . Cardioversion N/A 08/06/2015    Procedure: CARDIOVERSION;  Surgeon: Tiffany Taylor, MD;  Location: [facility];  Service: Cardiovascular;  Laterality: N/A;  . Back surgery    . Lumbar laminectomy  07/2015  . Posterior laminectomy thoracic spine      "fixed the discs"  . Skin cancer excision      "arms"    Family History  Problem Relation Age of Onset  . Coronary artery disease Mother   . Diabetes Mother   . CAD Mother   . Coronary artery disease Sister   . Hypertension Sister   . Kidney failure Sister   . CAD Sister   . Diabetes Brother   . Prostate cancer Brother   . Pancreatic cancer Brother   . Diabetes Brother   . Anesthesia problems Neg Hx   . Hypotension Neg Hx   . Malignant hyperthermia Neg Hx   . Pseudochol deficiency Neg Hx     Social History:  Married. Independent prior to back surgery. He reports that he has quit smoking. His smoking use included Cigarettes. He has a 79.5 pack-year smoking history. He has never used smokeless tobacco. He reports that he drinks alcohol. He reports that he does not use illicit drugs.    Allergies  Allergen Reactions  . Ace Inhibitors Other (See Comments)    Causes high K  . Angiotensin Receptor Blockers Other (See Comments)    Causes high K  . Metoprolol Other (See Comments)    May cause elevated K level  . Penicillins Swelling and Rash  . Tetracycline Swelling and Rash    Medications Prior to Admission    Medication Sig Dispense Refill  . acetaminophen (TYLENOL) 325 MG tablet Take 650 mg by mouth every 6 (six) hours as needed (Pain scale of 1-4 or temp > 101).    . albuterol (PROVENTIL) (2.5 MG/3ML) 0.083% nebulizer solution Take 2.5 mg by nebulization 2 (two) times daily.    . atorvastatin (LIPITOR) 20 MG tablet Take 20 mg by mouth daily at 6 PM.    . calcium-vitamin D (OSCAL WITH D) 500-200 MG-UNIT tablet Take 1 tablet by mouth daily with breakfast.    . docusate sodium (COLACE) 100 MG capsule Take 100 mg by mouth 2 (two) times daily.    . ferrous sulfate 325 (65 FE) MG tablet Take 325 mg by mouth 3 (three) times daily with meals.    . furosemide (LASIX) 20 MG tablet Take 20 mg by mouth   daily.    . glipiZIDE (GLUCOTROL) 5 MG tablet Take by mouth daily before breakfast.    . magnesium sulfate 2 GM/50ML SOLN infusion Inject into the vein once. If Mg < 0.9 Call MD 0.9-1.1 Give 2g IV over 1 hr x 3 doses 1.2-1.4 Give 2g IV over 1 hr x 2 doses 1.5-1.7 Give 2g IV over 1 hr x 1 dose Repeat Mg level 8 hrs after replacement    . metoprolol (LOPRESSOR) 5 MG/5ML SOLN injection Inject 2.5 mg into the vein every 4 (four) hours as needed (Keep HR < 115).    . metoprolol (LOPRESSOR) 50 MG tablet Take 50 mg by mouth 2 (two) times daily.    . Multiple Vitamins-Minerals (MULTIVITAMINS THER. W/MINERALS) TABS tablet Take 1 tablet by mouth daily.    . potassium chloride 10 MEQ/50ML Inject 10 mEq into the vein. K < 2.6 give 15mq Q4H x 3 doses K 2.7-3.0 Give 436m Q4H x 2 doses K 3.1-3.4 Give 4051mx 1 dose K > 3.5 No additional KCl needed    . potassium chloride SA (K-DUR,KLOR-CON) 20 MEQ tablet Take 20 mEq by mouth daily.    . predniSONE (DELTASONE) 10 MG tablet Take 10 mg by mouth daily with breakfast. Last dose of 3/1. Then to taper    . sodium polystyrene (KAYEXALATE) 15 GM/60ML suspension Take 15 g by mouth as needed (For K 5.2 - 5.5 mEq).    . vancomycin 1,000 mg in sodium chloride 0.9 % 250 mL Inject  1,000 mg into the vein daily. Daily at 1300. To stop on 2/28    . [DISCONTINUED] warfarin (COUMADIN) 5 MG tablet Take 1 tablet (5 mg total) by mouth daily at 6 PM. (Patient taking differently: Take 5 mg by mouth daily at 6 PM. Dose it not in records. Last known dose was 5mg17mily.)    . acetylcysteine (MUCOMYST) 20 % nebulizer solution Take 4 mLs by nebulization 3 (three) times daily. (Patient not taking: Reported on 08/10/2015) 30 mL 12  . amiodarone (PACERONE) 400 MG tablet Take 1 tablet (400 mg total) by mouth daily. (Patient not taking: Reported on 08/10/2015)    . diltiazem (CARDIZEM) 90 MG tablet Take 1 tablet (90 mg total) by mouth every 6 (six) hours. (Patient not taking: Reported on 08/10/2015)    . furosemide (LASIX) 10 MG/ML injection Inject 4 mLs (40 mg total) into the vein daily. (Patient not taking: Reported on 08/10/2015) 4 mL 0  . guaiFENesin (ROBITUSSIN) 100 MG/5ML SOLN Take 10 mLs (200 mg total) by mouth every 4 (four) hours. (Patient not taking: Reported on 08/10/2015) 1200 mL 0  . heparin 100-0.45 UNIT/ML-% infusion Inject 1,550 Units/hr into the vein continuous. (Patient not taking: Reported on 08/10/2015) 250 mL   . insulin glargine (LANTUS) 100 UNIT/ML injection Inject 0.1 mLs (10 Units total) into the skin daily. (Patient not taking: Reported on 08/10/2015) 10 mL 11  . levalbuterol (XOPENEX) 0.63 MG/3ML nebulizer solution Take 3 mLs (0.63 mg total) by nebulization every 6 (six) hours. (Patient not taking: Reported on 08/10/2015) 3 mL 12  . meropenem 1 g in sodium chloride 0.9 % 100 mL Inject 1 g into the vein every 12 (twelve) hours. (Patient not taking: Reported on 08/10/2015)    . nitroGLYCERIN (NITROSTAT) 0.4 MG SL tablet Place 1 tablet (0.4 mg total) under the tongue every 5 (five) minutes as needed for chest pain (x 3 doses). (Patient not taking: Reported on 08/10/2015) 25 tablet 3  . pantoprazole  sodium (PROTONIX) 40 mg/20 mL PACK Take 20 mLs (40 mg total) by mouth daily. (Patient not  taking: Reported on 08/10/2015) 30 each   . pravastatin (PRAVACHOL) 20 MG tablet TAKE 1 TABLET BY MOUTH DAILY (Patient not taking: Reported on 08/10/2015) 30 tablet 5  . predniSONE (DELTASONE) 10 MG tablet Take 5 tablets (50 mg total) by mouth daily. (Patient not taking: Reported on 08/10/2015)    . Vancomycin (VANCOCIN) 750 MG/150ML SOLN Inject 150 mLs (750 mg total) into the vein daily. (Patient not taking: Reported on 08/10/2015) 4000 mL   . [DISCONTINUED] Calcium Carbonate-Vit D-Min (CALCIUM 1200 PO) Take 1 tablet by mouth daily.     . [DISCONTINUED] Cholecalciferol 1000 UNITS capsule Take 1,000 Units by mouth daily.      . [DISCONTINUED] cyclobenzaprine (FLEXERIL) 10 MG tablet Take 10 mg by mouth 3 (three) times daily as needed for muscle spasms.    . [DISCONTINUED] fish oil-omega-3 fatty acids 1000 MG capsule Take 1 g by mouth daily.     . [DISCONTINUED] folic acid (FOLVITE) 1 MG tablet Take 1 mg by mouth daily.     . [DISCONTINUED] InFLIXimab (REMICADE IV) Inject 100 mg into the vein See admin instructions. Takes every 2 months    . [DISCONTINUED] VOLTAREN 1 % GEL Takes 1 application topically daily as needed for pain  0    Home: Home Living Family/patient expects to be discharged to:: Inpatient rehab Living Arrangements: Spouse/significant other Available Help at Discharge: Family, Available PRN/intermittently Type of Home: House Home Access: Stairs to enter Entrance Stairs-Number of Steps: 1 Entrance Stairs-Rails: None Home Layout: One level Bathroom Shower/Tub: Walk-in shower, Door Bathroom Toilet: Standard Bathroom Accessibility: Yes Home Equipment: Walker - 2 wheels, Bedside commode   Functional History: Prior Function Level of Independence: Independent Comments: prior to back surgery on 2/2, pt drives tractor trailers, completely independent. At [facility] (prior to admission to hospital), pt was barely standing with assist and transferring to chair.   Functional Status:    Mobility: Bed Mobility Overal bed mobility: Needs Assistance Bed Mobility: Rolling, Sidelying to Sit Rolling: Min guard Sidelying to sit: Min assist General bed mobility comments: assist for mobility due to weakness Transfers Overall transfer level: Needs assistance Equipment used: Rolling walker (2 wheeled) Transfers: Sit to/from Stand Sit to Stand: Mod assist, +2 safety/equipment Stand pivot transfers: Mod assist, +2 safety/equipment General transfer comment: initially trying to pull up on walker and cues and assist to place hands on bed, then had difficulty up from EOB so +2 A provided and pt still switched hands to pull up on walker.  second trial from recliner improved with armrests on chair Ambulation/Gait Ambulation/Gait assistance: Mod assist, +2 physical assistance Ambulation Distance (Feet): 20 Feet (and 30') Assistive device: Rolling walker (2 wheeled) Gait Pattern/deviations: Step-through pattern, Decreased stride length, Trendelenburg, Drifts right/left General Gait Details: noted L >R knee buckling and L hip weakness trendelenberg at times.  Cued patient to look up at goal for distance with improved upright posture and distance with gait Gait velocity: decreased    ADL: ADL Overall ADL's : Needs assistance/impaired Eating/Feeding: Set up, Sitting Eating/Feeding Details (indicate cue type and reason): UE weakness noted Grooming: Sitting, Set up Upper Body Bathing: Minimal assitance, Sitting Upper Body Bathing Details (indicate cue type and reason): min A due to UE weakness and decreased trunk strength Lower Body Bathing: Moderate assistance, +2 for physical assistance, Sit to/from stand Upper Body Dressing : Minimal assistance, Sitting Upper Body Dressing Details (indicate cue   type and reason): min A due to UE weakness and decreased trunk strength Lower Body Dressing: Moderate assistance, +2 for physical assistance Toilet Transfer: Moderate assistance, Stand-pivot,  BSC, RW, +2 for physical assistance Toileting- Clothing Manipulation and Hygiene: Moderate assistance, +2 for physical assistance, Sit to/from stand Tub/ Shower Transfer: Stand-pivot, Moderate assistance, +2 for physical assistance, 3 in 1, Rolling walker General ADL Comments: Pt completed SPT from EOB to recliner with mod +2 assist. Pt with weakness noted in BUE and trunk. Pt with posterior and at times right lateral lean.   Cognition: Cognition Overall Cognitive Status: Within Functional Limits for tasks assessed Orientation Level: Oriented X4 Cognition Arousal/Alertness: Awake/alert Behavior During Therapy: WFL for tasks assessed/performed Overall Cognitive Status: Within Functional Limits for tasks assessed Area of Impairment: Orientation (Able to state "hospital in [place]" and correct date, mon)    Blood pressure 142/68, pulse 87, temperature 98.3 F (36.8 C), temperature source Oral, resp. rate 18, height 5' 8" (1.727 m), weight 75.3 kg (166 lb 0.1 oz), SpO2 96 %. Physical Exam  Nursing note and vitals reviewed. Constitutional: He is oriented to person, place, and time. He appears well-developed and well-nourished. No distress.  HENT:  Head: Normocephalic.  Mouth/Throat: No oropharyngeal exudate.  Eyes: Conjunctivae and EOM are normal. Pupils are equal, round, and reactive to light.  Neck: Normal range of motion. Neck supple.  Cardiovascular: Normal rate.  An irregularly irregular rhythm present.  Respiratory: Effort normal and breath sounds normal. No respiratory distress. He has no wheezes. He exhibits no tenderness.  GI: He exhibits no distension. There is no tenderness.  Musculoskeletal: He exhibits no edema or tenderness.  Neurological: He is alert and oriented to person, place, and time. No cranial nerve deficit. Coordination normal.  Able to state date without difficulty. Able to follow simple motor commands. More alert and appropriate today. UE 5/5 prox to distal.  LE: 2+ Hf, 3+ke and 4/5 adf/apf bilaterally. No focal sensory deficits.   Skin: Skin is warm and dry. He is not diaphoretic.  Dry flaky skin.  Dehisced back incision with yellow eschar in wound bed.  Upper back erythematous appearing--likely due to bedding.   Psychiatric: He has a normal mood and affect. His behavior is normal.    Results for orders placed or performed during the hospital encounter of 08/10/15 (from the past 48 hour(s))  Glucose, capillary     Status: Abnormal   Collection Time: 08/15/15 11:19 AM  Result Value Ref Range   Glucose-Capillary 168 (H) 65 - 99 mg/dL   Comment 1 Notify RN   Glucose, capillary     Status: Abnormal   Collection Time: 08/15/15  4:26 PM  Result Value Ref Range   Glucose-Capillary 274 (H) 65 - 99 mg/dL   Comment 1 Notify RN   Glucose, capillary     Status: Abnormal   Collection Time: 08/15/15  9:06 PM  Result Value Ref Range   Glucose-Capillary 222 (H) 65 - 99 mg/dL  Protime-INR     Status: Abnormal   Collection Time: 08/16/15  5:10 AM  Result Value Ref Range   Prothrombin Time 32.1 (H) 11.6 - 15.2 seconds   INR 3.20 (H) 0.00 - 8.67  Basic metabolic panel     Status: Abnormal   Collection Time: 08/16/15  5:10 AM  Result Value Ref Range   Sodium 141 135 - 145 mmol/L   Potassium 3.8 3.5 - 5.1 mmol/L   Chloride 104 101 - 111 mmol/L   CO2 26  22 - 32 mmol/L   Glucose, Bld 109 (H) 65 - 99 mg/dL   BUN 12 6 - 20 mg/dL   Creatinine, Ser 1.09 0.61 - 1.24 mg/dL   Calcium 8.5 (L) 8.9 - 10.3 mg/dL   GFR calc non Af Amer >60 >60 mL/min   GFR calc Af Amer >60 >60 mL/min    Comment: (NOTE) The eGFR has been calculated using the CKD EPI equation. This calculation has not been validated in all clinical situations. eGFR's persistently <60 mL/min signify possible Chronic Kidney Disease.    Anion gap 11 5 - 15  Glucose, capillary     Status: None   Collection Time: 08/16/15  6:19 AM  Result Value Ref Range   Glucose-Capillary 93 65 - 99 mg/dL    Glucose, capillary     Status: Abnormal   Collection Time: 08/16/15 11:53 AM  Result Value Ref Range   Glucose-Capillary 190 (H) 65 - 99 mg/dL  Glucose, capillary     Status: Abnormal   Collection Time: 08/16/15  5:08 PM  Result Value Ref Range   Glucose-Capillary 235 (H) 65 - 99 mg/dL   Comment 1 Notify RN    Comment 2 Document in Chart   Glucose, capillary     Status: Abnormal   Collection Time: 08/16/15 10:37 PM  Result Value Ref Range   Glucose-Capillary 154 (H) 65 - 99 mg/dL   Comment 1 Notify RN   Protime-INR     Status: Abnormal   Collection Time: 08/17/15  5:00 AM  Result Value Ref Range   Prothrombin Time 24.9 (H) 11.6 - 15.2 seconds   INR 2.28 (H) 0.00 - 1.21  Basic metabolic panel     Status: Abnormal   Collection Time: 08/17/15  5:00 AM  Result Value Ref Range   Sodium 139 135 - 145 mmol/L   Potassium 4.1 3.5 - 5.1 mmol/L   Chloride 104 101 - 111 mmol/L   CO2 27 22 - 32 mmol/L   Glucose, Bld 141 (H) 65 - 99 mg/dL   BUN 15 6 - 20 mg/dL   Creatinine, Ser 1.10 0.61 - 1.24 mg/dL   Calcium 8.9 8.9 - 10.3 mg/dL   GFR calc non Af Amer >60 >60 mL/min   GFR calc Af Amer >60 >60 mL/min    Comment: (NOTE) The eGFR has been calculated using the CKD EPI equation. This calculation has not been validated in all clinical situations. eGFR's persistently <60 mL/min signify possible Chronic Kidney Disease.    Anion gap 8 5 - 15  Glucose, capillary     Status: Abnormal   Collection Time: 08/17/15  7:06 AM  Result Value Ref Range   Glucose-Capillary 106 (H) 65 - 99 mg/dL   Comment 1 Notify RN    No results found.     Medical Problem List and Plan: 1.  Debilitation and cognitive deficits secondary to encephalopathy and multiple medical complications after lumbar discectomy 2.  DVT Prophylaxis/Anticoagulation: Pharmaceutical: Coumadin 3. Chronic back pain/Pain Management: tylenol 4. Mood: LCSW to follow for evaluation and support.  5. Neuropsych: This patient is not  capable of making decisions on his own behalf. 6. Skin/Wound Care: Dry dressing daily. Monitor wound for healing. Add protein supplement to help with low protein stores 7. Fluids/Electrolytes/Nutrition:  Monitor I/O. Will need IVF at night to maintain adequate hydration status. All labs personally reviewed today. 8. Acute renal failure: Due to poor po intake which is resolving.  Continue to encourage po honey  thick liquids. Monitor renal status with serial checks. 9. A fib with RVR: Monitor HR bid. On amiodarone and coumadin --monitor for any symptoms with activity.  10. PMR/RA: Has completed stress dose steroids--now on maintaince dose.   11. Anemia of chronic illness/iron deficiency: On iron supplement.  12. GERD: Used PPI at home--resume today 13. DMT2: Currently on Lantus 10 units at bedtime but used glucotrol 5 mg daily at home . Monitor BS ac/hs basis with use SSI for now. Transition to oral hypoglycemic when intake is more consistent 14. CAD: Off ASA/plavix-- now on coumadin and lipitor.  15. Dysphagia: Dysphagia 1, honey liquids by tsp. Needs full supervision at meals.  IVF at nights as above due to swallow restrictions.  16. Cough: New in past 24 hours per patient and family. Has been getting Glucerna--unthickened.   Will check CXR. Nebs prn.    Post Admission Physician Evaluation: 1. Functional deficits secondary  to debility/encephalopathy. 2. Patient is admitted to receive collaborative, interdisciplinary care between the physiatrist, rehab nursing staff, and therapy team. 3. Patient's level of medical complexity and substantial therapy needs in context of that medical necessity cannot be provided at a lesser intensity of care such as a SNF. 4. Patient has experienced substantial functional loss from his/her baseline which was documented above under the "Functional History" and "Functional Status" headings.  Judging by the patient's diagnosis, physical exam, and functional history, the  patient has potential for functional progress which will result in measurable gains while on inpatient rehab.  These gains will be of substantial and practical use upon discharge  in facilitating mobility and self-care at the household level. 5. Physiatrist will provide 24 hour management of medical needs as well as oversight of the therapy plan/treatment and provide guidance as appropriate regarding the interaction of the two. 6. 24 hour rehab nursing will assist with bladder management, bowel management, safety, skin/wound care, disease management, medication administration, pain management and patient education  and help integrate therapy concepts, techniques,education, etc. 7. PT will assess and treat for/with: Lower extremity strength, range of motion, stamina, balance, functional mobility, safety, adaptive techniques and equipment, NMR, pain mgt, cognitive-perceptual awareness, family ed, wound mgt, spine precautions.   Goals are: mod I to min assist 8. OT will assess and treat for/with: ADL's, functional mobility, safety, upper extremity strength, adaptive techniques and equipment, NMR, cognitive perceptual awareness, spinal precautions, ego support.   Goals are: mod I to min assist. Therapy may not yet proceed with showering this patient. 9. SLP will assess and treat for/with: cognition, communication.  Goals are: mod I to supervision. 10. Case Management and Social Worker will assess and treat for psychological issues and discharge planning. 11. Team conference will be held weekly to assess progress toward goals and to determine barriers to discharge. 12. Patient will receive at least 3 hours of therapy per day at least 5 days per week. 13. ELOS: 15-20 days       14. Prognosis:  good     Zachary T. Swartz, MD, FAAPMR [facility] 08/17/2015   08/17/2015 

## 2015-08-17 NOTE — H&P (View-Only) (Signed)
Physical Medicine and Rehabilitation Admission H&P    CC: Encephalopathy and multiple medical issues after lumbar disectomy   HPI:  Curtis Frazier is a 75 y.o. male with history of CAD, psoriatic arthropathy, DMT2, RA, lumbar stenosis with outpatient lumbar discectomy on 07/15/15 who was admitted on 02/4 with sepsis due to PNA, mental status changes and A fib with RVR. hospital course complicated by VDRF, acute on chronic CHF, cardioversion for management of A fib, hypoxia requiring face mask as well as confusion with encephalopathy. He was transfered to [facility] on 02/21 but re-admitted to acute hospital on 02/28 with ongoing encephalopathy, worsening of lethargy, poor po intake and acute on chronic renal failure with hypernatremia. He was started on IV antibiotics as well as IVF due to hypotension/sepsis. Neurology consulted for input on confusion and asterixis and recommended MRI brain as well as EEG for workup. EEG done revealing mild slowing but no seizures. MRI of brain done revealing atrophy and small vessel disease with chronic and question acute right maxillary disease. Encephalopathy felt to be due to metabolic derangement.   BP medications held due to orthostatic changes, acute renal failure has resolved and mentation is slowly improving. Follow up CXR 3/3 shows no edema or infiltrate and he has been weaned off oxygen. FEES done 3/2 and dysphagia 1, honey liquids by spoon recommended for moderate pharyngeal dysphagia as well a pharyngeal strengthening  exercises.  Back wound dehiscence noted on 03/06 and Dr. Arnoldo Morale recommends daily dressing changes with follow up in 2 weeks. Therapy ongoing and CIR recommended for follow up therapy.    Review of Systems  Constitutional: Negative for malaise/fatigue.  HENT: Negative for hearing loss.   Eyes: Negative for double vision.       Hard to see TV without glasses--new  Respiratory: Positive for cough (feels some tightness in upper chest).  Negative for sputum production and shortness of breath.   Cardiovascular: Negative for chest pain, palpitations and leg swelling.  Gastrointestinal: Negative for heartburn, abdominal pain and constipation.  Genitourinary: Positive for frequency. Negative for dysuria and urgency.  Musculoskeletal: Negative for myalgias and back pain.  Skin: Negative for itching and rash.  Neurological: Positive for dizziness (with activity). Negative for sensory change and headaches.  Psychiatric/Behavioral: The patient is not nervous/anxious and does not have insomnia.       Past Medical History  Diagnosis Date  . Other and unspecified hyperlipidemia   . Coronary atherosclerosis of unspecified type of vessel, native or graft   . Personal history of colonic polyps   . Actinic keratosis   . Psoriatic arthropathy ([place])   . Other psoriasis   . Scoliosis (and kyphoscoliosis), idiopathic   . GI bleed 12/10    cecam AVM  . Gastritis 12/10  . Hyperlipidemia     takes Pravastatin daily  . Hypertension     takes Metoprolol daily  . Shortness of breath     with exertion  . Headache(784.0)     occasionally  . Joint pain   . Joint swelling   . Chronic back pain     scoliosis/stenosis/radiculopathy,degenerative disc disease  . Psoriasis   . Bruises easily   . Esophageal reflux     takes Omeprazole daily  . History of colonic polyps   . Hemorrhoids   . Kidney stone     hx of  . Blood transfusion 2010  . PMR (polymyalgia rheumatica) (HCC) 01/20/2012    tx by specialist on a long prednisone  taper  . S/P PTCA (percutaneous transluminal coronary angioplasty)   . Rheumatoid arthritis(714.0)     takes Methotrexate 7pills weekly  . Degeneration of intervertebral disc, site unspecified   . Myocardial infarction ([place]) 2010  . Pneumonia 2008; 07/2015  . Type II or unspecified type diabetes mellitus without mention of complication, not stated as uncontrolled   . Skin cancer     "arms"    Past Surgical  History  Procedure Laterality Date  . Rotator cuff repair Left 2000  . Colonoscopy    . Vasectomy  1979  . Coronary angioplasty with stent placement  02/2009    has one stent  . Carotid doppler  10/12    0-39% R and 60-79% left   . Cataract extraction  2012  . Cardiac catheterization  2010  . Lithotripsy  2009  . Tee without cardioversion N/A 08/06/2015    Procedure: TRANSESOPHAGEAL ECHOCARDIOGRAM (TEE);  Surgeon: Skeet Latch, MD;  Location: [facility];  Service: Cardiovascular;  Laterality: N/A;  . Cardioversion N/A 08/06/2015    Procedure: CARDIOVERSION;  Surgeon: Skeet Latch, MD;  Location: [facility] ENDOSCOPY;  Service: Cardiovascular;  Laterality: N/A;  . Back surgery    . Lumbar laminectomy  07/2015  . Posterior laminectomy thoracic spine      "fixed the discs"  . Skin cancer excision      "arms"    Family History  Problem Relation Age of Onset  . Coronary artery disease Mother   . Diabetes Mother   . CAD Mother   . Coronary artery disease Sister   . Hypertension Sister   . Kidney failure Sister   . CAD Sister   . Diabetes Brother   . Prostate cancer Brother   . Pancreatic cancer Brother   . Diabetes Brother   . Anesthesia problems Neg Hx   . Hypotension Neg Hx   . Malignant hyperthermia Neg Hx   . Pseudochol deficiency Neg Hx     Social History:  Married. Independent prior to back surgery. He reports that he has quit smoking. His smoking use included Cigarettes. He has a 79.5 pack-year smoking history. He has never used smokeless tobacco. He reports that he drinks alcohol. He reports that he does not use illicit drugs.    Allergies  Allergen Reactions  . Ace Inhibitors Other (See Comments)    Causes high K  . Angiotensin Receptor Blockers Other (See Comments)    Causes high K  . Metoprolol Other (See Comments)    May cause elevated K level  . Penicillins Swelling and Rash  . Tetracycline Swelling and Rash    Medications Prior to Admission    Medication Sig Dispense Refill  . acetaminophen (TYLENOL) 325 MG tablet Take 650 mg by mouth every 6 (six) hours as needed (Pain scale of 1-4 or temp > 101).    Marland Kitchen albuterol (PROVENTIL) (2.5 MG/3ML) 0.083% nebulizer solution Take 2.5 mg by nebulization 2 (two) times daily.    Marland Kitchen atorvastatin (LIPITOR) 20 MG tablet Take 20 mg by mouth daily at 6 PM.    . calcium-vitamin D (OSCAL WITH D) 500-200 MG-UNIT tablet Take 1 tablet by mouth daily with breakfast.    . docusate sodium (COLACE) 100 MG capsule Take 100 mg by mouth 2 (two) times daily.    . ferrous sulfate 325 (65 FE) MG tablet Take 325 mg by mouth 3 (three) times daily with meals.    . furosemide (LASIX) 20 MG tablet Take 20 mg by mouth  daily.    . glipiZIDE (GLUCOTROL) 5 MG tablet Take by mouth daily before breakfast.    . magnesium sulfate 2 GM/50ML SOLN infusion Inject into the vein once. If Mg < 0.9 Call MD 0.9-1.1 Give 2g IV over 1 hr x 3 doses 1.2-1.4 Give 2g IV over 1 hr x 2 doses 1.5-1.7 Give 2g IV over 1 hr x 1 dose Repeat Mg level 8 hrs after replacement    . metoprolol (LOPRESSOR) 5 MG/5ML SOLN injection Inject 2.5 mg into the vein every 4 (four) hours as needed (Keep HR < 115).    . metoprolol (LOPRESSOR) 50 MG tablet Take 50 mg by mouth 2 (two) times daily.    . Multiple Vitamins-Minerals (MULTIVITAMINS THER. W/MINERALS) TABS tablet Take 1 tablet by mouth daily.    . potassium chloride 10 MEQ/50ML Inject 10 mEq into the vein. K < 2.6 give 45mq Q4H x 3 doses K 2.7-3.0 Give 41m Q4H x 2 doses K 3.1-3.4 Give 407mx 1 dose K > 3.5 No additional KCl needed    . potassium chloride SA (K-DUR,KLOR-CON) 20 MEQ tablet Take 20 mEq by mouth daily.    . predniSONE (DELTASONE) 10 MG tablet Take 10 mg by mouth daily with breakfast. Last dose of 3/1. Then to taper    . sodium polystyrene (KAYEXALATE) 15 GM/60ML suspension Take 15 g by mouth as needed (For K 5.2 - 5.5 mEq).    . vancomycin 1,000 mg in sodium chloride 0.9 % 250 mL Inject  1,000 mg into the vein daily. Daily at 1300. To stop on 2/28    . [DISCONTINUED] warfarin (COUMADIN) 5 MG tablet Take 1 tablet (5 mg total) by mouth daily at 6 PM. (Patient taking differently: Take 5 mg by mouth daily at 6 PM. Dose it not in records. Last known dose was 5mg64mily.)    . acetylcysteine (MUCOMYST) 20 % nebulizer solution Take 4 mLs by nebulization 3 (three) times daily. (Patient not taking: Reported on 08/10/2015) 30 mL 12  . amiodarone (PACERONE) 400 MG tablet Take 1 tablet (400 mg total) by mouth daily. (Patient not taking: Reported on 08/10/2015)    . diltiazem (CARDIZEM) 90 MG tablet Take 1 tablet (90 mg total) by mouth every 6 (six) hours. (Patient not taking: Reported on 08/10/2015)    . furosemide (LASIX) 10 MG/ML injection Inject 4 mLs (40 mg total) into the vein daily. (Patient not taking: Reported on 08/10/2015) 4 mL 0  . guaiFENesin (ROBITUSSIN) 100 MG/5ML SOLN Take 10 mLs (200 mg total) by mouth every 4 (four) hours. (Patient not taking: Reported on 08/10/2015) 1200 mL 0  . heparin 100-0.45 UNIT/ML-% infusion Inject 1,550 Units/hr into the vein continuous. (Patient not taking: Reported on 08/10/2015) 250 mL   . insulin glargine (LANTUS) 100 UNIT/ML injection Inject 0.1 mLs (10 Units total) into the skin daily. (Patient not taking: Reported on 08/10/2015) 10 mL 11  . levalbuterol (XOPENEX) 0.63 MG/3ML nebulizer solution Take 3 mLs (0.63 mg total) by nebulization every 6 (six) hours. (Patient not taking: Reported on 08/10/2015) 3 mL 12  . meropenem 1 g in sodium chloride 0.9 % 100 mL Inject 1 g into the vein every 12 (twelve) hours. (Patient not taking: Reported on 08/10/2015)    . nitroGLYCERIN (NITROSTAT) 0.4 MG SL tablet Place 1 tablet (0.4 mg total) under the tongue every 5 (five) minutes as needed for chest pain (x 3 doses). (Patient not taking: Reported on 08/10/2015) 25 tablet 3  . pantoprazole  sodium (PROTONIX) 40 mg/20 mL PACK Take 20 mLs (40 mg total) by mouth daily. (Patient not  taking: Reported on 08/10/2015) 30 each   . pravastatin (PRAVACHOL) 20 MG tablet TAKE 1 TABLET BY MOUTH DAILY (Patient not taking: Reported on 08/10/2015) 30 tablet 5  . predniSONE (DELTASONE) 10 MG tablet Take 5 tablets (50 mg total) by mouth daily. (Patient not taking: Reported on 08/10/2015)    . Vancomycin (VANCOCIN) 750 MG/150ML SOLN Inject 150 mLs (750 mg total) into the vein daily. (Patient not taking: Reported on 08/10/2015) 4000 mL   . [DISCONTINUED] Calcium Carbonate-Vit D-Min (CALCIUM 1200 PO) Take 1 tablet by mouth daily.     . [DISCONTINUED] Cholecalciferol 1000 UNITS capsule Take 1,000 Units by mouth daily.      . [DISCONTINUED] cyclobenzaprine (FLEXERIL) 10 MG tablet Take 10 mg by mouth 3 (three) times daily as needed for muscle spasms.    . [DISCONTINUED] fish oil-omega-3 fatty acids 1000 MG capsule Take 1 g by mouth daily.     . [DISCONTINUED] folic acid (FOLVITE) 1 MG tablet Take 1 mg by mouth daily.     . [DISCONTINUED] InFLIXimab (REMICADE IV) Inject 100 mg into the vein See admin instructions. Takes every 2 months    . [DISCONTINUED] VOLTAREN 1 % GEL Takes 1 application topically daily as needed for pain  0    Home: Home Living Family/patient expects to be discharged to:: Inpatient rehab Living Arrangements: Spouse/significant other Available Help at Discharge: Family, Available PRN/intermittently Type of Home: House Home Access: Stairs to enter [employer] of Steps: 1 Entrance Stairs-Rails: None Home Layout: One level Bathroom Shower/Tub: Gaffer, Charity fundraiser: Standard Bathroom Accessibility: Yes Home Equipment: Environmental consultant - 2 wheels, Bedside commode   Functional History: Prior Function Level of Independence: Independent Comments: prior to back surgery on 2/2, pt drives tractor trailers, completely independent. At [facility] (prior to admission to hospital), pt was barely standing with assist and transferring to chair.   Functional Status:    Mobility: Bed Mobility Overal bed mobility: Needs Assistance Bed Mobility: Rolling, Sidelying to Sit Rolling: Min guard Sidelying to sit: Min assist General bed mobility comments: assist for mobility due to weakness Transfers Overall transfer level: Needs assistance Equipment used: Rolling walker (2 wheeled) Transfers: Sit to/from Stand Sit to Stand: Mod assist, +2 safety/equipment Stand pivot transfers: Mod assist, +2 safety/equipment General transfer comment: initially trying to pull up on walker and cues and assist to place hands on bed, then had difficulty up from EOB so +2 A provided and pt still switched hands to pull up on walker.  second trial from recliner improved with armrests on chair Ambulation/Gait Ambulation/Gait assistance: Mod assist, +2 physical assistance Ambulation Distance (Feet): 20 Feet (and 30') Assistive device: Rolling walker (2 wheeled) Gait Pattern/deviations: Step-through pattern, Decreased stride length, Trendelenburg, Drifts right/left General Gait Details: noted L >R knee buckling and L hip weakness trendelenberg at times.  Cued patient to look up at goal for distance with improved upright posture and distance with gait Gait velocity: decreased    ADL: ADL Overall ADL's : Needs assistance/impaired Eating/Feeding: Set up, Sitting Eating/Feeding Details (indicate cue type and reason): UE weakness noted Grooming: Sitting, Set up Upper Body Bathing: Minimal assitance, Sitting Upper Body Bathing Details (indicate cue type and reason): min A due to UE weakness and decreased trunk strength Lower Body Bathing: Moderate assistance, +2 for physical assistance, Sit to/from stand Upper Body Dressing : Minimal assistance, Sitting Upper Body Dressing Details (indicate cue  type and reason): min A due to UE weakness and decreased trunk strength Lower Body Dressing: Moderate assistance, +2 for physical assistance Toilet Transfer: Moderate assistance, Stand-pivot,  BSC, RW, +2 for physical assistance Toileting- Clothing Manipulation and Hygiene: Moderate assistance, +2 for physical assistance, Sit to/from stand Tub/ Shower Transfer: Stand-pivot, Moderate assistance, +2 for physical assistance, 3 in 1, Rolling walker General ADL Comments: Pt completed SPT from EOB to recliner with mod +2 assist. Pt with weakness noted in BUE and trunk. Pt with posterior and at times right lateral lean.   Cognition: Cognition Overall Cognitive Status: Within Functional Limits for tasks assessed Orientation Level: Oriented X4 Cognition Arousal/Alertness: Awake/alert Behavior During Therapy: WFL for tasks assessed/performed Overall Cognitive Status: Within Functional Limits for tasks assessed Area of Impairment: Orientation (Able to state "hospital in [place]" and correct date, mon)    Blood pressure 142/68, pulse 87, temperature 98.3 F (36.8 C), temperature source Oral, resp. rate 18, height 5' 8" (1.727 m), weight 75.3 kg (166 lb 0.1 oz), SpO2 96 %. Physical Exam  Nursing note and vitals reviewed. Constitutional: He is oriented to person, place, and time. He appears well-developed and well-nourished. No distress.  HENT:  Head: Normocephalic.  Mouth/Throat: No oropharyngeal exudate.  Eyes: Conjunctivae and EOM are normal. Pupils are equal, round, and reactive to light.  Neck: Normal range of motion. Neck supple.  Cardiovascular: Normal rate.  An irregularly irregular rhythm present.  Respiratory: Effort normal and breath sounds normal. No respiratory distress. He has no wheezes. He exhibits no tenderness.  GI: He exhibits no distension. There is no tenderness.  Musculoskeletal: He exhibits no edema or tenderness.  Neurological: He is alert and oriented to person, place, and time. No cranial nerve deficit. Coordination normal.  Able to state date without difficulty. Able to follow simple motor commands. More alert and appropriate today. UE 5/5 prox to distal.  LE: 2+ Hf, 3+ke and 4/5 adf/apf bilaterally. No focal sensory deficits.   Skin: Skin is warm and dry. He is not diaphoretic.  Dry flaky skin.  Dehisced back incision with yellow eschar in wound bed.  Upper back erythematous appearing--likely due to bedding.   Psychiatric: He has a normal mood and affect. His behavior is normal.    Results for orders placed or performed during the hospital encounter of 08/10/15 (from the past 48 hour(s))  Glucose, capillary     Status: Abnormal   Collection Time: 08/15/15 11:19 AM  Result Value Ref Range   Glucose-Capillary 168 (H) 65 - 99 mg/dL   Comment 1 Notify RN   Glucose, capillary     Status: Abnormal   Collection Time: 08/15/15  4:26 PM  Result Value Ref Range   Glucose-Capillary 274 (H) 65 - 99 mg/dL   Comment 1 Notify RN   Glucose, capillary     Status: Abnormal   Collection Time: 08/15/15  9:06 PM  Result Value Ref Range   Glucose-Capillary 222 (H) 65 - 99 mg/dL  Protime-INR     Status: Abnormal   Collection Time: 08/16/15  5:10 AM  Result Value Ref Range   Prothrombin Time 32.1 (H) 11.6 - 15.2 seconds   INR 3.20 (H) 0.00 - 8.67  Basic metabolic panel     Status: Abnormal   Collection Time: 08/16/15  5:10 AM  Result Value Ref Range   Sodium 141 135 - 145 mmol/L   Potassium 3.8 3.5 - 5.1 mmol/L   Chloride 104 101 - 111 mmol/L   CO2 26  22 - 32 mmol/L   Glucose, Bld 109 (H) 65 - 99 mg/dL   BUN 12 6 - 20 mg/dL   Creatinine, Ser 1.09 0.61 - 1.24 mg/dL   Calcium 8.5 (L) 8.9 - 10.3 mg/dL   GFR calc non Af Amer >60 >60 mL/min   GFR calc Af Amer >60 >60 mL/min    Comment: (NOTE) The eGFR has been calculated using the CKD EPI equation. This calculation has not been validated in all clinical situations. eGFR's persistently <60 mL/min signify possible Chronic Kidney Disease.    Anion gap 11 5 - 15  Glucose, capillary     Status: None   Collection Time: 08/16/15  6:19 AM  Result Value Ref Range   Glucose-Capillary 93 65 - 99 mg/dL    Glucose, capillary     Status: Abnormal   Collection Time: 08/16/15 11:53 AM  Result Value Ref Range   Glucose-Capillary 190 (H) 65 - 99 mg/dL  Glucose, capillary     Status: Abnormal   Collection Time: 08/16/15  5:08 PM  Result Value Ref Range   Glucose-Capillary 235 (H) 65 - 99 mg/dL   Comment 1 Notify RN    Comment 2 Document in Chart   Glucose, capillary     Status: Abnormal   Collection Time: 08/16/15 10:37 PM  Result Value Ref Range   Glucose-Capillary 154 (H) 65 - 99 mg/dL   Comment 1 Notify RN   Protime-INR     Status: Abnormal   Collection Time: 08/17/15  5:00 AM  Result Value Ref Range   Prothrombin Time 24.9 (H) 11.6 - 15.2 seconds   INR 2.28 (H) 0.00 - 1.21  Basic metabolic panel     Status: Abnormal   Collection Time: 08/17/15  5:00 AM  Result Value Ref Range   Sodium 139 135 - 145 mmol/L   Potassium 4.1 3.5 - 5.1 mmol/L   Chloride 104 101 - 111 mmol/L   CO2 27 22 - 32 mmol/L   Glucose, Bld 141 (H) 65 - 99 mg/dL   BUN 15 6 - 20 mg/dL   Creatinine, Ser 1.10 0.61 - 1.24 mg/dL   Calcium 8.9 8.9 - 10.3 mg/dL   GFR calc non Af Amer >60 >60 mL/min   GFR calc Af Amer >60 >60 mL/min    Comment: (NOTE) The eGFR has been calculated using the CKD EPI equation. This calculation has not been validated in all clinical situations. eGFR's persistently <60 mL/min signify possible Chronic Kidney Disease.    Anion gap 8 5 - 15  Glucose, capillary     Status: Abnormal   Collection Time: 08/17/15  7:06 AM  Result Value Ref Range   Glucose-Capillary 106 (H) 65 - 99 mg/dL   Comment 1 Notify RN    No results found.     Medical Problem List and Plan: 1.  Debilitation and cognitive deficits secondary to encephalopathy and multiple medical complications after lumbar discectomy 2.  DVT Prophylaxis/Anticoagulation: Pharmaceutical: Coumadin 3. Chronic back pain/Pain Management: tylenol 4. Mood: LCSW to follow for evaluation and support.  5. Neuropsych: This patient is not  capable of making decisions on his own behalf. 6. Skin/Wound Care: Dry dressing daily. Monitor wound for healing. Add protein supplement to help with low protein stores 7. Fluids/Electrolytes/Nutrition:  Monitor I/O. Will need IVF at night to maintain adequate hydration status. All labs personally reviewed today. 8. Acute renal failure: Due to poor po intake which is resolving.  Continue to encourage po honey  thick liquids. Monitor renal status with serial checks. 9. A fib with RVR: Monitor HR bid. On amiodarone and coumadin --monitor for any symptoms with activity.  10. PMR/RA: Has completed stress dose steroids--now on maintaince dose.   11. Anemia of chronic illness/iron deficiency: On iron supplement.  12. GERD: Used PPI at home--resume today 13. DMT2: Currently on Lantus 10 units at bedtime but used glucotrol 5 mg daily at home . Monitor BS ac/hs basis with use SSI for now. Transition to oral hypoglycemic when intake is more consistent 14. CAD: Off ASA/plavix-- now on coumadin and lipitor.  15. Dysphagia: Dysphagia 1, honey liquids by tsp. Needs full supervision at meals.  IVF at nights as above due to swallow restrictions.  16. Cough: New in past 24 hours per patient and family. Has been getting Glucerna--unthickened.   Will check CXR. Nebs prn.    Post Admission Physician Evaluation: 1. Functional deficits secondary  to debility/encephalopathy. 2. Patient is admitted to receive collaborative, interdisciplinary care between the physiatrist, rehab nursing staff, and therapy team. 3. Patient's level of medical complexity and substantial therapy needs in context of that medical necessity cannot be provided at a lesser intensity of care such as a SNF. 4. Patient has experienced substantial functional loss from his/her baseline which was documented above under the "Functional History" and "Functional Status" headings.  Judging by the patient's diagnosis, physical exam, and functional history, the  patient has potential for functional progress which will result in measurable gains while on inpatient rehab.  These gains will be of substantial and practical use upon discharge  in facilitating mobility and self-care at the household level. 5. Physiatrist will provide 24 hour management of medical needs as well as oversight of the therapy plan/treatment and provide guidance as appropriate regarding the interaction of the two. 6. 24 hour rehab nursing will assist with bladder management, bowel management, safety, skin/wound care, disease management, medication administration, pain management and patient education  and help integrate therapy concepts, techniques,education, etc. 7. PT will assess and treat for/with: Lower extremity strength, range of motion, stamina, balance, functional mobility, safety, adaptive techniques and equipment, NMR, pain mgt, cognitive-perceptual awareness, family ed, wound mgt, spine precautions.   Goals are: mod I to min assist 8. OT will assess and treat for/with: ADL's, functional mobility, safety, upper extremity strength, adaptive techniques and equipment, NMR, cognitive perceptual awareness, spinal precautions, ego support.   Goals are: mod I to min assist. Therapy may not yet proceed with showering this patient. 9. SLP will assess and treat for/with: cognition, communication.  Goals are: mod I to supervision. 10. Case Management and Social Worker will assess and treat for psychological issues and discharge planning. 11. Team conference will be held weekly to assess progress toward goals and to determine barriers to discharge. 12. Patient will receive at least 3 hours of therapy per day at least 5 days per week. 13. ELOS: 15-20 days       14. Prognosis:  good     Meredith Staggers, MD, [facility] 08/17/2015   08/17/2015

## 2015-08-17 NOTE — Progress Notes (Signed)
ANTICOAGULATION CONSULT NOTE - Initial Consult  Pharmacy Consult for Warfarin Indication: atrial fibrillation  Allergies  Allergen Reactions  . Ace Inhibitors Other (See Comments)    Causes high K  . Angiotensin Receptor Blockers Other (See Comments)    Causes high K  . Metoprolol Other (See Comments)    May cause elevated K level  . Penicillins Swelling and Rash  . Tetracycline Swelling and Rash    Patient Measurements:    Vital Signs: Temp: 98.4 F (36.9 C) (03/07 1500) Temp Source: Oral (03/07 1500) BP: 138/74 mmHg (03/07 1500) Pulse Rate: 88 (03/07 1500)  Labs:  Recent Labs  08/15/15 0424 08/16/15 0510 08/17/15 0500  HGB 9.8*  --   --   HCT 31.5*  --   --   PLT 249  --   --   LABPROT 45.2* 32.1* 24.9*  INR 5.03* 3.20* 2.28*  CREATININE 0.99 1.09 1.10    Estimated Creatinine Clearance: 55.3 mL/min (by C-G formula based on Cr of 1.1).   Medical History: Past Medical History  Diagnosis Date  . Other and unspecified hyperlipidemia   . Coronary atherosclerosis of unspecified type of vessel, native or graft   . Personal history of colonic polyps   . Actinic keratosis   . Psoriatic arthropathy ([place])   . Other psoriasis   . Scoliosis (and kyphoscoliosis), idiopathic   . GI bleed 12/10    cecam AVM  . Gastritis 12/10  . Hyperlipidemia     takes Pravastatin daily  . Hypertension     takes Metoprolol daily  . Shortness of breath     with exertion  . Headache(784.0)     occasionally  . Joint pain   . Joint swelling   . Chronic back pain     scoliosis/stenosis/radiculopathy,degenerative disc disease  . Psoriasis   . Bruises easily   . Esophageal reflux     takes Omeprazole daily  . History of colonic polyps   . Hemorrhoids   . Kidney stone     hx of  . Blood transfusion 2010  . PMR (polymyalgia rheumatica) (HCC) 01/20/2012    tx by specialist on a long prednisone taper  . S/P PTCA (percutaneous transluminal coronary angioplasty)   . Rheumatoid  arthritis(714.0)     takes Methotrexate 7pills weekly  . Degeneration of intervertebral disc, site unspecified   . Myocardial infarction ([place]) 2010  . Pneumonia 2008; 07/2015  . Type II or unspecified type diabetes mellitus without mention of complication, not stated as uncontrolled   . Skin cancer     "arms"    Assessment: 75 YOM to resume warfarin for Afib. INR went from therapeutic to SUPRAtherapeutic from admission to 3/2. On 3/3 - the INR trended back down and pharmacy resumed warfarin doses. After 2 doses - the INR rose to 5.03 and the consult was discontinued. Vit K 2.5 mg was given and then INR today has trended back down to 2.28. Pharmacy has been consulted to resume warfarin dosing.   It is noted that the patient already received warfarin 5 mg x 1 dose prior to transfer to rehab today. This dose was entered by the prior physician, Dr. Conley Canal. No additional doses needed today.  The patient is noted to be on amiodarone and fluconazole which can increase warfarin sensitivity.   PTA dose: 5mg  daily  Goal of Therapy:  INR 2-3 Monitor platelets by anticoagulation protocol: Yes   Plan:  1. No additional warfarin needed this evening - 5  mg x 1 ordered by MD and already given 2. Daily PT/INR 3. Will continue to monitor for any signs/symptoms of bleeding and will follow up with PT/INR in the a.m.   Alycia Rossetti, PharmD, BCPS Clinical Pharmacist Pager: [phone] 08/17/2015 6:05 PM

## 2015-08-17 NOTE — Progress Notes (Signed)
[facility] PROGRESS NOTE  Curtis Frazier [id number] DOB: 08-26-1940 DOA: 08/10/2015 PCP: Loura Pardon, MD  Assessment/Plan: Did not go to CIR 3/6, no beds. Will go today.  Sodium, blood pressure, CBGs remain stable. INR 2.28. Resume warfarin. Consider d/c PICC soon if stable. Discharge summary ammended  HPI/Subjective: No dyspnea.   Objective: Filed Vitals:   08/17/15 0404 08/17/15 1016  BP: 142/68 137/70  Pulse: 87 93  Temp: 98.3 F (36.8 C) 98.7 F (37.1 C)  Resp: 18 18    Intake/Output Summary (Last 24 hours) at 08/17/15 1121 Last data filed at 08/17/15 0900  Gross per 24 hour  Intake   1320 ml  Output   1125 ml  Net    195 ml   Filed Weights   08/13/15 0500 08/15/15 0500 08/17/15 0500  Weight: 77.8 kg (171 lb 8.3 oz) 83.2 kg (183 lb 6.8 oz) 75.3 kg (166 lb 0.1 oz)    telemetry: Normal sinus rhythm  Exam:  Unchanged from 3/6  Data Reviewed: Basic Metabolic Panel:  Recent Labs Lab 08/11/15 1440  08/13/15 0430 08/14/15 0459 08/15/15 0424 08/16/15 0510 08/17/15 0500  NA 150*  < > 147* 147* 143 141 139  K 4.1  < > 3.5 3.6 3.6 3.8 4.1  CL 115*  < > 114* 112* 106 104 104  CO2 24  < > 24 27 27 26 27   GLUCOSE 236*  < > 250* 214* 169* 109* 141*  BUN 44*  < > 30* 19 13 12 15   CREATININE 2.12*  < > 1.34* 1.14 0.99 1.09 1.10  CALCIUM 7.9*  < > 7.7* 8.2* 8.0* 8.5* 8.9  MG 1.8  --   --   --   --   --   --   < > = values in this interval not displayed. Liver Function Tests:  Recent Labs Lab 08/10/15 1940  AST 21  ALT 25  ALKPHOS 84  BILITOT 1.3*  PROT 6.4*  ALBUMIN 2.4*   No results for input(s): LIPASE, AMYLASE in the last 168 hours.  Recent Labs Lab 08/10/15 1940  AMMONIA 15   CBC:  Recent Labs Lab 08/10/15 1940 08/11/15 1440 08/12/15 0433 08/13/15 0430 08/15/15 0424  WBC 8.0 9.4 7.0 7.7 9.1  NEUTROABS 6.6  --   --   --   --   HGB 11.3* 10.5* 8.9* 9.3* 9.8*  HCT 37.5*  35.9* 35.4* 29.2* 30.8* 31.5*  MCV 101.1* 99.7 98.3 99.0  97.8  PLT 202 216 186 207 249   Cardiac Enzymes:  Recent Labs Lab 08/11/15 1440  TROPONINI 0.05*   BNP (last 3 results)  Recent Labs  08/01/15 0022 08/09/15 0540 08/12/15 1040  BNP 881.4* 357.5* 222.8*    ProBNP (last 3 results) No results for input(s): PROBNP in the last 8760 hours.  CBG:  Recent Labs Lab 08/16/15 0619 08/16/15 1153 08/16/15 1708 08/16/15 2237 08/17/15 0706  GLUCAP 93 190* 235* 154* 106*    Recent Results (from the past 240 hour(s))  Culture, Urine     Status: None   Collection Time: 08/11/15  2:06 PM  Result Value Ref Range Status   Specimen Description URINE, CLEAN CATCH  Final   Special Requests NONE  Final   Culture NO GROWTH 1 DAY  Final   Report Status 08/12/2015 FINAL  Final  Culture, blood (Routine X 2) w Reflex to ID Panel     Status: None   Collection Time: 08/11/15  2:40 PM  Result Value Ref Range Status   Specimen Description BLOOD LEFT ARM  Final   Special Requests IN PEDIATRIC BOTTLE 3CC  Final   Culture NO GROWTH 5 DAYS  Final   Report Status 08/16/2015 FINAL  Final  Culture, blood (Routine X 2) w Reflex to ID Panel     Status: None   Collection Time: 08/11/15  2:45 PM  Result Value Ref Range Status   Specimen Description BLOOD LEFT HAND  Final   Special Requests IN PEDIATRIC BOTTLE 2CC  Final   Culture NO GROWTH 5 DAYS  Final   Report Status 08/16/2015 FINAL  Final     Studies: No results found.  Scheduled Meds: . amiodarone  400 mg Oral Daily  . antiseptic oral rinse  7 mL Mouth Rinse BID  . atorvastatin  20 mg Oral q1800  . feeding supplement (GLUCERNA SHAKE)  237 mL Oral TID BM  . ferrous sulfate  325 mg Oral TID WC  . insulin aspart  0-15 Units Subcutaneous TID WC  . insulin aspart  0-5 Units Subcutaneous QHS  . insulin glargine  10 Units Subcutaneous QHS  . predniSONE  10 mg Oral Q breakfast  . senna  2 tablet Oral Daily  . sodium chloride flush  3 mL Intravenous Q12H  . thiamine  100 mg Oral Daily  .  warfarin  5 mg Oral q1800  . Warfarin - Physician Dosing Inpatient   Does not apply q1800   Continuous Infusions:    Principal Problem:   Encephalopathy Active Problems:   Diabetes mellitus type 2, controlled, without complications (HCC)   ANEMIA-UNSPECIFIED   Essential hypertension, benign   Coronary atherosclerosis   GERD   LLL pneumonia   PAF (paroxysmal atrial fibrillation) (HCC)   Hypernatremia   Acute on chronic renal failure (HCC)   Chronic diastolic congestive heart failure ([place])   Hypotension   Thrush   Urinary retention    Time spent: 05 MINS    Delfina Redwood MD [facility]  [email or web address], password [facility] 08/17/2015, 11:21 AM  LOS: 7 days

## 2015-08-18 ENCOUNTER — Inpatient Hospital Stay (HOSPITAL_COMMUNITY): Payer: Medicare Other | Admitting: Speech Pathology

## 2015-08-18 ENCOUNTER — Inpatient Hospital Stay (HOSPITAL_COMMUNITY): Payer: Medicare Other | Admitting: Occupational Therapy

## 2015-08-18 ENCOUNTER — Inpatient Hospital Stay (HOSPITAL_COMMUNITY): Payer: Medicare Other | Admitting: Physical Therapy

## 2015-08-18 LAB — CBC WITH DIFFERENTIAL/PLATELET
BASOS PCT: 0 %
Basophils Absolute: 0 10*3/uL (ref 0.0–0.1)
Eosinophils Absolute: 0.5 10*3/uL (ref 0.0–0.7)
Eosinophils Relative: 5 %
HEMATOCRIT: 34.3 % — AB (ref 39.0–52.0)
HEMOGLOBIN: 11.2 g/dL — AB (ref 13.0–17.0)
LYMPHS ABS: 3.7 10*3/uL (ref 0.7–4.0)
Lymphocytes Relative: 33 %
MCH: 31.3 pg (ref 26.0–34.0)
MCHC: 32.7 g/dL (ref 30.0–36.0)
MCV: 95.8 fL (ref 78.0–100.0)
MONO ABS: 0.9 10*3/uL (ref 0.1–1.0)
MONOS PCT: 8 %
NEUTROS ABS: 6 10*3/uL (ref 1.7–7.7)
Neutrophils Relative %: 54 %
Platelets: 290 10*3/uL (ref 150–400)
RBC: 3.58 MIL/uL — ABNORMAL LOW (ref 4.22–5.81)
RDW: 15.9 % — AB (ref 11.5–15.5)
WBC: 11.2 10*3/uL — ABNORMAL HIGH (ref 4.0–10.5)

## 2015-08-18 LAB — COMPREHENSIVE METABOLIC PANEL
ALBUMIN: 2.3 g/dL — AB (ref 3.5–5.0)
ALK PHOS: 85 U/L (ref 38–126)
ALT: 48 U/L (ref 17–63)
ANION GAP: 10 (ref 5–15)
AST: 31 U/L (ref 15–41)
BILIRUBIN TOTAL: 0.5 mg/dL (ref 0.3–1.2)
BUN: 15 mg/dL (ref 6–20)
CALCIUM: 8.8 mg/dL — AB (ref 8.9–10.3)
CHLORIDE: 103 mmol/L (ref 101–111)
CO2: 25 mmol/L (ref 22–32)
Creatinine, Ser: 1.27 mg/dL — ABNORMAL HIGH (ref 0.61–1.24)
GFR, EST NON AFRICAN AMERICAN: 53 mL/min — AB (ref >60)
Glucose, Bld: 131 mg/dL — ABNORMAL HIGH (ref 65–99)
Potassium: 4.3 mmol/L (ref 3.5–5.1)
Sodium: 138 mmol/L (ref 135–145)
Total Protein: 5.5 g/dL — ABNORMAL LOW (ref 6.5–8.1)

## 2015-08-18 LAB — GLUCOSE, CAPILLARY
GLUCOSE-CAPILLARY: 144 mg/dL — AB (ref 65–99)
GLUCOSE-CAPILLARY: 174 mg/dL — AB (ref 65–99)
Glucose-Capillary: 235 mg/dL — ABNORMAL HIGH (ref 65–99)
Glucose-Capillary: 280 mg/dL — ABNORMAL HIGH (ref 65–99)

## 2015-08-18 LAB — PROTIME-INR
INR: 2.22 — AB (ref 0.00–1.49)
Prothrombin Time: 24.4 seconds — ABNORMAL HIGH (ref 11.6–15.2)

## 2015-08-18 MED ORDER — WARFARIN SODIUM 5 MG PO TABS
5.0000 mg | ORAL_TABLET | Freq: Once | ORAL | Status: AC
  Administered 2015-08-18: 5 mg via ORAL
  Filled 2015-08-18: qty 1

## 2015-08-18 NOTE — Evaluation (Signed)
Occupational Therapy Assessment and Plan  Patient Details  Name: Curtis Frazier MRN: [id number] Date of Birth: Apr 30, 1941  OT Diagnosis: muscular wasting and disuse atrophy and muscle weakness (generalized) Rehab Potential: Rehab Potential (ACUTE ONLY): Good ELOS: 14-16 days   Today's Date: 08/18/2015 OT Individual Time: 4163-8453 OT Individual Time Calculation (min): 67 min     Problem List:  Patient Active Problem List   Diagnosis Date Noted  . Encephalopathy, metabolic 64/68/0321  . Debility   . 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 Dysphagia   . Spondylosis of lumbar region without myelopathy or radiculopathy   . Thrush 08/12/2015  . Urinary retention 08/12/2015  . Hypotension 08/11/2015  . Hypernatremia 08/10/2015  . Encephalopathy 08/10/2015  . Acute on chronic renal failure ([place]) 08/10/2015  . Chronic diastolic congestive heart failure ([place]) 08/10/2015  . Persistent atrial fibrillation ([place])   . Acute diastolic congestive heart failure ([place])   . Dyspnea   . Disorientation   . Slow transit constipation   . Back pain without radiation   . Counseling regarding advanced care planning and goals of care   . PAF (paroxysmal atrial fibrillation) ([place]) 07/29/2015  . Advanced care planning/counseling discussion   . Pneumonia   . Change in mental state   . Endotracheally intubated   . CHF (congestive heart failure) ([place])   . Palliative care encounter 07/21/2015  . DNR (do not resuscitate) discussion 07/21/2015  . HCAP (healthcare-associated pneumonia)   . Acute respiratory failure ([place])   . Acute respiratory failure with hypoxia ([place]) 07/18/2015  . Atrial fibrillation with RVR ([place]) 07/18/2015  . Acute pulmonary edema ([place]) 07/18/2015  . Sepsis ([place]) 07/17/2015  . Acute encephalopathy 07/17/2015  . LLL pneumonia 07/17/2015  . Type II or unspecified type diabetes mellitus without mention of  complication, not stated as uncontrolled 07/17/2015  . S/P lumbar laminectomy 07/17/2015  . Elevated troponin 07/17/2015  . Type 2 diabetes mellitus without complication, without long-term current use of insulin ([place])   . Acute bronchitis 03/05/2015  . Back pain, thoracic 01/15/2015  . Chest wall pain 01/15/2015  . Routine general medical examination at a health care facility 11/23/2014  . Productive cough 10/14/2014  . Left flank pain 09/25/2014  . Renal insufficiency 11/14/2013  . Encounter for Medicare annual wellness exam 07/07/2013  . BPH (benign prostatic hyperplasia) 07/05/2012  . Prostate cancer screening 06/27/2012  . Low TSH level 01/22/2012  . PMR (polymyalgia rheumatica) (HCC) 01/20/2012  . Aortic stenosis 07/08/2011  . New onset a-fib ([place]) 07/05/2011    Class: Acute  . HYPERKALEMIA 02/18/2010  . Essential hypertension, benign 09/13/2009  . ANEMIA-UNSPECIFIED 06/10/2009  . ANGIODYSPLASIA-INTESTINE 06/10/2009  . MURMUR 05/21/2009  . Left carotid bruit 05/21/2009  . Coronary atherosclerosis 03/09/2009  . GERD 03/09/2009  . Rheumatoid arthritis ([place]) 04/29/2008  . HX, PERSONAL, COLONIC POLYPS 01/17/2007  . Diabetes mellitus type 2, controlled, without complications ([place]) 22/48/2500  . Elevated lipids 09/18/2006  . PSORIATIC ARTHRITIS 09/18/2006  . PSORIASIS 09/18/2006  . 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 DEGENERATIVE DISC DISEASE 09/18/2006  . SCOLIOSIS 09/18/2006  . TOBACCO ABUSE, HX OF 09/18/2006    Past Medical History:  Past Medical History  Diagnosis Date  . Other and unspecified hyperlipidemia   . Coronary atherosclerosis of unspecified type of vessel, native or graft   . Personal history of colonic polyps   . Actinic keratosis   . Psoriatic  arthropathy ([place])   . Other psoriasis   . Scoliosis (and kyphoscoliosis), idiopathic   . GI bleed 12/10    cecam AVM  . Gastritis 12/10  . Hyperlipidemia     takes Pravastatin daily  . Hypertension     takes  Metoprolol daily  . Shortness of breath     with exertion  . Headache(784.0)     occasionally  . Joint pain   . Joint swelling   . Chronic back pain     scoliosis/stenosis/radiculopathy,degenerative disc disease  . Psoriasis   . Bruises easily   . Esophageal reflux     takes Omeprazole daily  . History of colonic polyps   . Hemorrhoids   . Kidney stone     hx of  . Blood transfusion 2010  . PMR (polymyalgia rheumatica) (HCC) 01/20/2012    tx by specialist on a long prednisone taper  . S/P PTCA (percutaneous transluminal coronary angioplasty)   . Rheumatoid arthritis(714.0)     takes Methotrexate 7pills weekly  . Degeneration of intervertebral disc, site unspecified   . Myocardial infarction ([place]) 2010  . Pneumonia 2008; 07/2015  . Type II or unspecified type diabetes mellitus without mention of complication, not stated as uncontrolled   . Skin cancer     "arms"   Past Surgical History:  Past Surgical History  Procedure Laterality Date  . Rotator cuff repair Left 2000  . Colonoscopy    . Vasectomy  1979  . Coronary angioplasty with stent placement  02/2009    has one stent  . Carotid doppler  10/12    0-39% R and 60-79% left   . Cataract extraction  2012  . Cardiac catheterization  2010  . Lithotripsy  2009  . Tee without cardioversion N/A 08/06/2015    Procedure: TRANSESOPHAGEAL ECHOCARDIOGRAM (TEE);  Surgeon: Skeet Latch, MD;  Location: [facility];  Service: Cardiovascular;  Laterality: N/A;  . Cardioversion N/A 08/06/2015    Procedure: CARDIOVERSION;  Surgeon: Skeet Latch, MD;  Location[facility] ENDOSCOPY;  Service: Cardiovascular;  Laterality: N/A;  . Back surgery    . Lumbar laminectomy  07/2015  . Posterior laminectomy thoracic spine      "fixed the discs"  . Skin cancer excision      "arms"    Assessment & Plan Clinical Impression: Curtis Frazier is a 75 y.o. male with history of CAD, psoriatic arthropathy, DMT2, RA, lumbar stenosis with outpatient lumbar  discectomy on 07/15/15 who was admitted on 02/4 with sepsis due to PNA, mental status changes and A fib with RVR.  hospital course complicated by VDRF, acute on chronic CHF, cardioversion for management of A fib, hypoxia requiring face mask as well as confusion with encephalopathy. He was transfered to [facility] on 02/21 but re-admitted to acute hospital on 02/28 with ongoing encephalopathy, worsening of lethargy, poor po intake and acute on chronic renal failure with hypernatremia. He was started on IV antibiotics as well as IVF due to hypotension/sepsis. Neurology consulted for input on confusion and asterixis and recommended MRI brain as well as EEG for workup.  EEG done revealing mild slowing but no seizures.  MRI of brain done revealing atrophy and small vessel disease with chronic and question acute right maxillary disease. Encephalopathy felt to be due to metabolic derangement.   BP medications held due to orthostatic changes, acute renal failure has resolved and mentation is slowly improving. Follow up CXR 3/3 shows no edema or infiltrate and he has been weaned off  oxygen. FEES done 3/2 and dysphagia 1, honey liquids by spoon recommended for moderate pharyngeal dysphagia as well a pharyngeal strengthening  exercises.  Back wound dehiscence noted on 03/06 and Dr. Arnoldo Morale recommends daily dressing changes with follow up in 2 weeks. Therapy ongoing and CIR recommended for follow up therapy.    Patient transferred to CIR on 08/17/2015 .    Patient currently requires mod with basic self-care skills secondary to muscle weakness, decreased cardiorespiratoy endurance and decreased standing balance and decreased balance strategies.  Prior to hospitalization, patient could complete all ADLs independently and was driving/ working part time.  Patient will benefit from skilled intervention to increase independence with basic self-care skills prior to discharge home with care partner.  Anticipate patient will require  intermittent supervision and follow up home health.  OT - End of Session Activity Tolerance: Tolerates < 10 min activity with changes in vital signs Endurance Deficit: Yes Endurance Deficit Description: low blood pressure, heavy/rapid mouth breathing, tolerates standing less than 3-4 seconds at a time OT Assessment Rehab Potential (ACUTE ONLY): Good OT Patient demonstrates impairments in the following area(s): Balance;Endurance;Motor;Sensory OT Basic ADL's Functional Problem(s): Grooming;Bathing;Dressing;Toileting OT Advanced ADL's Functional Problem(s): Simple Meal Preparation;Light Housekeeping OT Transfers Functional Problem(s): Toilet;Tub/Shower OT Additional Impairment(s): None OT Plan OT Intensity: Minimum of 1-2 x/day, 45 to 90 minutes OT Frequency: 5 out of 7 days OT Duration/Estimated Length of Stay: 14-16 days OT Treatment/Interventions: Balance/vestibular training;Discharge planning;DME/adaptive equipment instruction;Functional mobility training;Patient/family education;Self Care/advanced ADL retraining;Therapeutic Activities;Therapeutic Exercise;UE/LE Strength taining/ROM OT Self Feeding Anticipated Outcome(s): I OT Basic Self-Care Anticipated Outcome(s): mod I OT Toileting Anticipated Outcome(s): mod I OT Bathroom Transfers Anticipated Outcome(s): mod I to toilet, supervision to shower OT Recommendation Patient destination: Home Follow Up Recommendations: Home health OT Equipment Recommended: 3 in 1 bedside comode;Tub/shower seat   Skilled Therapeutic Intervention Pt seen for initial evaluation, pt/family education on role of OT/OT POC/ OT goals, and ADL retraining.  Pt completed B/D at sink level and practiced w/c><toilet and w/c><recliner transfers.  Pt was able to push up with BUE into stand with close S, but could only hold standing balance for 3 seconds at a time. Each time he needed mod A to sit down slowly as he tends to collapse into chair. Low blood pressure, pt did  feel slightly dizzy.  Pt tends to breathe heavily and needs cues to breathe through nose. Pt adjusted in recliner with all needs met.     OT Evaluation Precautions/Restrictions  Precautions Precautions: Fall;Back Precaution Comments: Recent spine surgery 2/2 Restrictions Weight Bearing Restrictions: No General OT Amount of Missed Time: 8 Minutes Vital Signs Therapy Vitals BP: (!) 84/52 mmHg Patient Position (if appropriate): Sitting Pain Pain Assessment Pain Assessment: No/denies pain Home Living/Prior Functioning Home Living Available Help at Discharge: Family, Available 24 hours/day Type of Home: House Home Access: Stairs to enter [employer] of Steps: 1 (from garage) Entrance Stairs-Rails: None Home Layout: One level Bathroom Shower/Tub: Gaffer, Charity fundraiser: Handicapped height Bathroom Accessibility: Yes  Lives With: Spouse Prior Function Level of Independence: Independent with basic ADLs, Independent with gait, Independent with homemaking with ambulation  Able to Take Stairs?: Yes Driving: Yes Vocation: Part time employment 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 Leisure: Hobbies-yes (Comment) Comments: used cane outside the home sometimes, likes country music ADL ADL ADL Comments: Refer to functional navigator  Vision/Perception  Vision- History Baseline Vision/History: Wears glasses Wears Glasses: At all times Patient Visual Report: Blurring of vision (pt states his  eyes feel cloudy) Vision- Assessment Vision Assessment?: No apparent visual deficits Perception Comments: WFL  Cognition Overall Cognitive Status: Within Functional Limits for tasks assessed Arousal/Alertness: Awake/alert Orientation Level: Person;Place;Situation Person: Oriented Place: Oriented Situation: Oriented Year: 2017 Month: March Day of Week: Correct Memory: Appears intact Immediate Memory Recall: Sock;Blue;Bed Memory Recall:  Blue;Sock;Bed Memory Recall Sock: With Cue Memory Recall Blue: Without Cue Memory Recall Bed: Without Cue Awareness: Appears intact Safety/Judgment: Appears intact Sensation Sensation Stereognosis: Appears Intact Coordination Finger Nose Finger Test: 10x in 10 sec BUE Motor   generalized muscle weakness Mobility    refer to functional navigator Trunk/Postural Assessment  Cervical Assessment Cervical Assessment: Within Functional Limits Thoracic Assessment Thoracic Assessment: Within Functional Limits Lumbar Assessment Lumbar Assessment: Exceptions to [facility] (lumbar sx, back precautions) Postural Control Postural Control: Within Functional Limits  Balance Dynamic Sitting Balance Dynamic Sitting - Level of Assistance: 5: Stand by assistance Static Standing Balance Static Standing - Level of Assistance: 4: Min assist Dynamic Standing Balance Dynamic Standing - Level of Assistance: 3: Mod assist (posterior lean) Extremity/Trunk Assessment RUE Assessment RUE Assessment: Within Functional Limits LUE Assessment LUE Assessment: Within Functional Limits   See Function Navigator for Current Functional Status.   Refer to Care Plan for Long Term Goals  Recommendations for other services: None  Discharge Criteria: Patient will be discharged from OT if patient refuses treatment 3 consecutive times without medical reason, if treatment goals not met, if there is a change in medical status, if patient makes no progress towards goals or if patient is discharged from hospital.  The above assessment, treatment plan, treatment alternatives and goals were discussed and mutually agreed upon: by patient and by family  SAGUIER,JULIA 08/18/2015, 12:52 PM

## 2015-08-18 NOTE — Evaluation (Signed)
Speech Language Pathology Assessment and Plan  Patient Details  Name: Curtis Frazier MRN: [id number] Date of Birth: [birth year]  SLP Diagnosis: Dysphagia;Cognitive Impairments  Rehab Potential: Excellent ELOS: 14-16 days     Today's Date: 08/18/2015 SLP Individual Time: 1300-1400 SLP Individual Time Calculation (min): 60 min   Problem List:  Patient Active Problem List   Diagnosis Date Noted  . Encephalopathy, metabolic 50/08/7046  . Debility   . 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 Dysphagia   . Spondylosis of lumbar region without myelopathy or radiculopathy   . Thrush 08/12/2015  . Urinary retention 08/12/2015  . Hypotension 08/11/2015  . Hypernatremia 08/10/2015  . Encephalopathy 08/10/2015  . Acute on chronic renal failure ([place]) 08/10/2015  . Chronic diastolic congestive heart failure ([place]) 08/10/2015  . Persistent atrial fibrillation ([place])   . Acute diastolic congestive heart failure ([place])   . Dyspnea   . Disorientation   . Slow transit constipation   . Back pain without radiation   . Counseling regarding advanced care planning and goals of care   . PAF (paroxysmal atrial fibrillation) ([place]) 07/29/2015  . Advanced care planning/counseling discussion   . Pneumonia   . Change in mental state   . Endotracheally intubated   . CHF (congestive heart failure) ([place])   . Palliative care encounter 07/21/2015  . DNR (do not resuscitate) discussion 07/21/2015  . HCAP (healthcare-associated pneumonia)   . Acute respiratory failure ([place])   . Acute respiratory failure with hypoxia ([place]) 07/18/2015  . Atrial fibrillation with RVR ([place]) 07/18/2015  . Acute pulmonary edema ([place]) 07/18/2015  . Sepsis ([place]) 07/17/2015  . Acute encephalopathy 07/17/2015  . LLL pneumonia 07/17/2015  . Type II or unspecified type diabetes mellitus without mention of complication, not stated as uncontrolled 07/17/2015  .  S/P lumbar laminectomy 07/17/2015  . Elevated troponin 07/17/2015  . Type 2 diabetes mellitus without complication, without long-term current use of insulin ([place])   . Acute bronchitis 03/05/2015  . Back pain, thoracic 01/15/2015  . Chest wall pain 01/15/2015  . Routine general medical examination at a health care facility 11/23/2014  . Productive cough 10/14/2014  . Left flank pain 09/25/2014  . Renal insufficiency 11/14/2013  . Encounter for Medicare annual wellness exam 07/07/2013  . BPH (benign prostatic hyperplasia) 07/05/2012  . Prostate cancer screening 06/27/2012  . Low TSH level 01/22/2012  . PMR (polymyalgia rheumatica) ([facility]) 01/20/2012  . Aortic stenosis 07/08/2011  . New onset a-fib ([place]) 07/05/2011    Class: Acute  . HYPERKALEMIA 02/18/2010  . Essential hypertension, benign 09/13/2009  . ANEMIA-UNSPECIFIED 06/10/2009  . ANGIODYSPLASIA-INTESTINE 06/10/2009  . MURMUR 05/21/2009  . Left carotid bruit 05/21/2009  . Coronary atherosclerosis 03/09/2009  . GERD 03/09/2009  . Rheumatoid arthritis ([place]) 04/29/2008  . HX, PERSONAL, COLONIC POLYPS 01/17/2007  . Diabetes mellitus type 2, controlled, without complications ([place]) 88/91/6945  . Elevated lipids 09/18/2006  . PSORIATIC ARTHRITIS 09/18/2006  . PSORIASIS 09/18/2006  . 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 DEGENERATIVE DISC DISEASE 09/18/2006  . SCOLIOSIS 09/18/2006  . TOBACCO ABUSE, HX OF 09/18/2006   Past Medical History:  Past Medical History  Diagnosis Date  . Other and unspecified hyperlipidemia   . Coronary atherosclerosis of unspecified type of vessel, native or graft   . Personal history of colonic polyps   . Actinic keratosis   . Psoriatic arthropathy ([place])   . Other psoriasis   .  Scoliosis (and kyphoscoliosis), idiopathic   . GI bleed 12/10    cecam AVM  . Gastritis 12/10  . Hyperlipidemia     takes Pravastatin daily  . Hypertension     takes Metoprolol daily  . Shortness of breath     with exertion   . Headache(784.0)     occasionally  . Joint pain   . Joint swelling   . Chronic back pain     scoliosis/stenosis/radiculopathy,degenerative disc disease  . Psoriasis   . Bruises easily   . Esophageal reflux     takes Omeprazole daily  . History of colonic polyps   . Hemorrhoids   . Kidney stone     hx of  . Blood transfusion 2010  . PMR (polymyalgia rheumatica) ([facility]) 01/20/2012    tx by specialist on a long prednisone taper  . S/P PTCA (percutaneous transluminal coronary angioplasty)   . Rheumatoid arthritis(714.0)     takes Methotrexate 7pills weekly  . Degeneration of intervertebral disc, site unspecified   . Myocardial infarction ([place]) 2010  . Pneumonia 2008; 07/2015  . Type II or unspecified type diabetes mellitus without mention of complication, not stated as uncontrolled   . Skin cancer     "arms"   Past Surgical History:  Past Surgical History  Procedure Laterality Date  . Rotator cuff repair Left 2000  . Colonoscopy    . Vasectomy  1979  . Coronary angioplasty with stent placement  02/2009    has one stent  . Carotid doppler  10/12    0-39% R and 60-79% left   . Cataract extraction  2012  . Cardiac catheterization  2010  . Lithotripsy  2009  . Tee without cardioversion N/A 08/06/2015    Procedure: TRANSESOPHAGEAL ECHOCARDIOGRAM (TEE);  Surgeon: Skeet Latch, MD;  Location: [facility];  Service: Cardiovascular;  Laterality: N/A;  . Cardioversion N/A 08/06/2015    Procedure: CARDIOVERSION;  Surgeon: Skeet Latch, MD;  Location[facility] ENDOSCOPY;  Service: Cardiovascular;  Laterality: N/A;  . Back surgery    . Lumbar laminectomy  07/2015  . Posterior laminectomy thoracic spine      "fixed the discs"  . Skin cancer excision      "arms"    Assessment / Plan / Recommendation Clinical Impression Patient is a 75 y.o. male with history of CAD, psoriatic arthropathy, DMT2, RA, lumbar stenosis with outpatient lumbar discectomy on 07/15/15 who was admitted on 02/4  with sepsis due to PNA, mental status changes and A fib with RVR.  Hospital course complicated by VDRF, acute on chronic CHF, cardioversion for management of A fib, hypoxia requiring face mask as well as confusion with encephalopathy. He was transferred to [facility] on 02/21 but re-admitted to acute hospital on 02/28 with ongoing encephalopathy, worsening of lethargy, poor PO intake and acute on chronic renal failure with hypernatremia. He was started on IV antibiotics as well as IVF due to hypotension/sepsis. Neurology consulted for input on confusion and asterixis and recommended MRI brain as well as EEG for workup.  EEG done revealing mild slowing but no seizures. MRI of brain done revealing atrophy and small vessel disease with chronic and question acute right maxillary disease. Encephalopathy felt to be due to metabolic derangement. BP medications held due to orthostatic changes, acute renal failure has resolved and mentation is slowly improving. Follow up CXR 3/3 shows no edema or infiltrate and he has been weaned off oxygen. FEES done 3/2 and recommended dysphagia 1 textures with honey-thick liquids by  spoon due to moderate pharyngeal dysphagia.  Back wound dehiscence noted on 03/06 and Dr. Arnoldo Morale recommends daily dressing changes with follow up in 2 weeks. Therapy ongoing and CIR recommended for follow up therapy. Patient admitted 08/17/15. Patient presents with mild cognitive impairments impacting selective attention and recall of daily information as well as short-term recall. Patient administered a limited BSE due to awaiting SLP to consume his lunch meal of Dys. 1 textures with honey-thick liquids via tsp. Patient did not demonstrate any overt s/s of aspiration but demonstrated decreased hyolaryngeal elevation with both solids and liquids and required Mod-Max A verbal cues for use of multiple swallows and a slow rate of self-feeding. Recommend patient continue current diet with full supervision to maximize  safety. Patient would benefit from skilled SLP intervention to maximize cognitive and swallowing function and overall functional independence prior to discharge home. Anticipate patient will require outpatient SLP services and 24 hour supervision at discharge.   Skilled Therapeutic Interventions          Administered a cognitive-linguistic evaluation and BSE. Please see above for details. Patient administered the MoCA (version 7.1) and scored 21/30 points with a score of 26 or above considered normal.   SLP Assessment  Patient will need skilled Speech Lanaguage Pathology Services during CIR admission    Recommendations  SLP Diet Recommendations: Honey;Dysphagia 1 (Puree) Liquid Administration via: Spoon Medication Administration: Crushed with puree Supervision: Full supervision/cueing for compensatory strategies;Patient able to self feed Compensations: Minimize environmental distractions;Slow rate;Small sips/bites;Multiple dry swallows after each bite/sip Postural Changes and/or Swallow Maneuvers: Seated upright 90 degrees Oral Care Recommendations: Oral care BID Recommendations for Other Services: Neuropsych consult Patient destination: Home Follow up Recommendations: Outpatient SLP;24 hour supervision/assistance Equipment Recommended: To be determined    SLP Frequency 3 to 5 out of 7 days   SLP Duration  SLP Intensity  SLP Treatment/Interventions 14-16 days   Minumum of 1-2 x/day, 30 to 90 minutes  Cognitive remediation/compensation;Cueing hierarchy;Dysphagia/aspiration precaution training;Functional tasks;Patient/family education;Therapeutic Activities;Speech/Language facilitation;Therapeutic Exercise;Internal/external aids;Environmental controls    Pain Pain Assessment Pain Assessment: No/denies pain  Prior Functioning Cognitive/Linguistic Baseline: Within functional limits Type of Home: House  Lives With: Spouse Available Help at Discharge: Family;Available 24  hours/day Vocation: Part time employment  Function:  Eating Eating   Modified Consistency Diet: Yes Eating Assist Level: Supervision or verbal cues;Set up assist for   Eating Set Up Assist For: Opening containers       Cognition Comprehension Comprehension assist level: Understands complex 90% of the time/cues 10% of the time  Expression   Expression assist level: Expresses basic needs/ideas: With no assist  Social Interaction Social Interaction assist level: Interacts appropriately 90% of the time - Needs monitoring or encouragement for participation or interaction.  Problem Solving Problem solving assist level: Solves basic problems with no assist  Memory Memory assist level: Recognizes or recalls 50 - 74% of the time/requires cueing 25 - 49% of the time   Short Term Goals: Week 1: SLP Short Term Goal 1 (Week 1): Patient will demonstrate selective attention to a functional task in a mildly distracting enviornment for 30 minutes with Min A verbal cues for redirection.  SLP Short Term Goal 2 (Week 1): Patient will utilize external memory aids to recall new, daily information with Min A verbal and questioning cues.  SLP Short Term Goal 3 (Week 1): Patient will consume current diet with minimal overt s/s of aspiration with Mod A verbal cues for use of swallowing compensatory strategies.  SLP  Short Term Goal 4 (Week 1): Patient will demonstrate efficient mastication with trials of Dys. 2 textures demonstrated by minimal oral residue over 2 consecutive sessions prior to upgrade.  SLP Short Term Goal 5 (Week 1): Patient will consume trials of nectar-thick liquids via tsp with minimal overt s/s of aspiration with Min A verbal cues for use of swallowing comepnsatory strategies over 2 consecutive sesions prior to discharge.  SLP Short Term Goal 6 (Week 1): Patient will perform pharyngeal strengthening exercises with supervision verbal cues for accuracy.   Refer to Care Plan for Long Term  Goals  Recommendations for other services: Neuropsych  Discharge Criteria: Patient will be discharged from SLP if patient refuses treatment 3 consecutive times without medical reason, if treatment goals not met, if there is a change in medical status, if patient makes no progress towards goals or if patient is discharged from hospital.  The above assessment, treatment plan, treatment alternatives and goals were discussed and mutually agreed upon: by patient and by family  Mckinley Olheiser 08/18/2015, 4:25 PM

## 2015-08-18 NOTE — Progress Notes (Signed)
Charlett Blake, MD Physician Signed Physical Medicine and Rehabilitation Consult Note 08/12/2015 11:35 AM  Related encounter: Admission (Discharged) from 08/10/2015 in [facility]        Physical Medicine and Rehabilitation Consult  Reason for Consult: Debility due to multiple medical issue after lumbar disectomy Referring Physician: Dr. Conley Canal.    HPI: Curtis Frazier is a 75 y.o. male with history of CAD, psoriatic arthropathy, DMT2, RA, lumbar stenosis with outpatient lumbar discectomy on 07/15/15 who was admitted on 02/4 with sepsis due to PNA, mental status changes and A fib with RVR. He had VDRF, acute on chronic CHF,cardioversion for management of A fib, issues with encephalopathy and required transfer to [facility] on 02/21 due to high oxygen needs. He was re-admitted to acute hospital on 02/28 with persistent encephalopathy, reports of poor po intake X few days with acute on chronic renal failure, hypernateremia and lethargy. He was started on IV antibiotics as well as IVF due to hypotension/sepsis. Neurology consulted for input on confusion and asterixis. EEG done revealing mild slowing but no seizures. Mentation improving and MRI brain pending. FEES done and patient with moderate pharyngeal dysphagia with mild dyspnea--dysphagia 1, honey liquids by spoon recommended. PT evaluation done today and CIR recommended due to deconditioned state.   Prior to his lumbar discectomy he was having back pain but he was independent with all his bathing dressing grooming and toileting. He has 6 children, 5 of them live in this area and would be available to assist post discharge  Review of Systems  HENT:   Swallowing problems  Eyes: Negative.  Respiratory: Negative.  Cardiovascular: Negative.  Gastrointestinal: Negative.  Genitourinary: Negative.  Musculoskeletal: Positive for back pain.  Skin: Negative.  Neurological:  Positive for weakness.  Endo/Heme/Allergies: Negative.  Psychiatric/Behavioral: Positive for memory loss.    Past Medical History  Diagnosis Date  . Other and unspecified hyperlipidemia   . Coronary atherosclerosis of unspecified type of vessel, native or graft   . Personal history of colonic polyps   . Actinic keratosis   . Psoriatic arthropathy ([place])   . Other psoriasis   . Scoliosis (and kyphoscoliosis), idiopathic   . GI bleed 12/10    cecam AVM  . Gastritis 12/10  . Hyperlipidemia     takes Pravastatin daily  . Hypertension     takes Metoprolol daily  . Shortness of breath     with exertion  . Headache(784.0)     occasionally  . Joint pain   . Joint swelling   . Chronic back pain     scoliosis/stenosis/radiculopathy,degenerative disc disease  . Psoriasis   . Bruises easily   . Esophageal reflux     takes Omeprazole daily  . History of colonic polyps   . Hemorrhoids   . Kidney stone     hx of  . Blood transfusion 2010  . PMR (polymyalgia rheumatica) (HCC) 01/20/2012    tx by specialist on a long prednisone taper  . S/P PTCA (percutaneous transluminal coronary angioplasty)   . Rheumatoid arthritis(714.0)     takes Methotrexate 7pills weekly  . Degeneration of intervertebral disc, site unspecified   . Myocardial infarction ([place]) 2010  . Pneumonia 2008; 07/2015  . Type II or unspecified type diabetes mellitus without mention of complication, not stated as uncontrolled   . Skin cancer     "arms"    Past Surgical History  Procedure Laterality Date  .  Rotator cuff repair Left 2000  . Colonoscopy    . Vasectomy  1979  . Coronary angioplasty with stent placement  02/2009    has one stent  . Carotid doppler  10/12    0-39% R and 60-79% left   . Cataract extraction  2012  . Cardiac catheterization   2010  . Lithotripsy  2009  . Tee without cardioversion N/A 08/06/2015    Procedure: TRANSESOPHAGEAL ECHOCARDIOGRAM (TEE); Surgeon: Skeet Latch, MD; Location: [facility]; Service: Cardiovascular; Laterality: N/A;  . Cardioversion N/A 08/06/2015    Procedure: CARDIOVERSION; Surgeon: Skeet Latch, MD; Location[facility] ENDOSCOPY; Service: Cardiovascular; Laterality: N/A;  . Back surgery    . Lumbar laminectomy  07/2015  . Posterior laminectomy thoracic spine      "fixed the discs"  . Skin cancer excision      "arms"    Family History  Problem Relation Age of Onset  . Coronary artery disease Mother   . Diabetes Mother   . CAD Mother   . Coronary artery disease Sister   . Hypertension Sister   . Kidney failure Sister   . CAD Sister   . Diabetes Brother   . Prostate cancer Brother   . Pancreatic cancer Brother   . Diabetes Brother   . Anesthesia problems Neg Hx   . Hypotension Neg Hx   . Malignant hyperthermia Neg Hx   . Pseudochol deficiency Neg Hx     Social History:  reports that he has quit smoking. His smoking use included Cigarettes. He has a 79.5 pack-year smoking history. He has never used smokeless tobacco. He reports that he drinks alcohol. He reports that he does not use illicit drugs.     Allergies  Allergen Reactions  . Ace Inhibitors Other (See Comments)    Causes high K  . Angiotensin Receptor Blockers Other (See Comments)    Causes high K  . Metoprolol Other (See Comments)    May cause elevated K level  . Penicillins Swelling and Rash  . Tetracycline Swelling and Rash   Medications Prior to Admission  Medication Sig Dispense Refill  . acetaminophen (TYLENOL) 325 MG tablet Take 650 mg by mouth every 6 (six) hours as needed (Pain scale of 1-4 or temp > 101).    Marland Kitchen albuterol (PROVENTIL) (2.5 MG/3ML)  0.083% nebulizer solution Take 2.5 mg by nebulization 2 (two) times daily.    Marland Kitchen atorvastatin (LIPITOR) 20 MG tablet Take 20 mg by mouth daily at 6 PM.    . calcium-vitamin D (OSCAL WITH D) 500-200 MG-UNIT tablet Take 1 tablet by mouth daily with breakfast.    . docusate sodium (COLACE) 100 MG capsule Take 100 mg by mouth 2 (two) times daily.    . ferrous sulfate 325 (65 FE) MG tablet Take 325 mg by mouth 3 (three) times daily with meals.    . furosemide (LASIX) 20 MG tablet Take 20 mg by mouth daily.    Marland Kitchen glipiZIDE (GLUCOTROL) 5 MG tablet Take by mouth daily before breakfast.    . magnesium sulfate 2 GM/50ML SOLN infusion Inject into the vein once. If Mg < 0.9 Call MD 0.9-1.1 Give 2g IV over 1 hr x 3 doses 1.2-1.4 Give 2g IV over 1 hr x 2 doses 1.5-1.7 Give 2g IV over 1 hr x 1 dose Repeat Mg level 8 hrs after replacement    . metoprolol (LOPRESSOR) 5 MG/5ML SOLN injection Inject 2.5 mg into the vein every 4 (four) hours as needed (Keep  HR < 115).    . metoprolol (LOPRESSOR) 50 MG tablet Take 50 mg by mouth 2 (two) times daily.    . Multiple Vitamins-Minerals (MULTIVITAMINS THER. W/MINERALS) TABS tablet Take 1 tablet by mouth daily.    . potassium chloride 10 MEQ/50ML Inject 10 mEq into the vein. K < 2.6 give 55mEq Q4H x 3 doses K 2.7-3.0 Give 24mEq Q4H x 2 doses K 3.1-3.4 Give 23mEq x 1 dose K > 3.5 No additional KCl needed    . potassium chloride SA (K-DUR,KLOR-CON) 20 MEQ tablet Take 20 mEq by mouth daily.    . predniSONE (DELTASONE) 10 MG tablet Take 10 mg by mouth daily with breakfast. Last dose of 3/1. Then to taper    . sodium polystyrene (KAYEXALATE) 15 GM/60ML suspension Take 15 g by mouth as needed (For K 5.2 - 5.5 mEq).    . vancomycin 1,000 mg in sodium chloride 0.9 % 250 mL Inject 1,000 mg into the vein daily. Daily at 1300. To stop on 2/28    . warfarin (COUMADIN) 5 MG tablet Take 1 tablet (5 mg  total) by mouth daily at 6 PM. (Patient taking differently: Take 5 mg by mouth daily at 6 PM. Dose it not in records. Last known dose was 5mg  daily.)    . acetylcysteine (MUCOMYST) 20 % nebulizer solution Take 4 mLs by nebulization 3 (three) times daily. (Patient not taking: Reported on 08/10/2015) 30 mL 12  . amiodarone (PACERONE) 400 MG tablet Take 1 tablet (400 mg total) by mouth daily. (Patient not taking: Reported on 08/10/2015)    . diltiazem (CARDIZEM) 90 MG tablet Take 1 tablet (90 mg total) by mouth every 6 (six) hours. (Patient not taking: Reported on 08/10/2015)    . furosemide (LASIX) 10 MG/ML injection Inject 4 mLs (40 mg total) into the vein daily. (Patient not taking: Reported on 08/10/2015) 4 mL 0  . guaiFENesin (ROBITUSSIN) 100 MG/5ML SOLN Take 10 mLs (200 mg total) by mouth every 4 (four) hours. (Patient not taking: Reported on 08/10/2015) 1200 mL 0  . heparin 100-0.45 UNIT/ML-% infusion Inject 1,550 Units/hr into the vein continuous. (Patient not taking: Reported on 08/10/2015) 250 mL   . insulin glargine (LANTUS) 100 UNIT/ML injection Inject 0.1 mLs (10 Units total) into the skin daily. (Patient not taking: Reported on 08/10/2015) 10 mL 11  . levalbuterol (XOPENEX) 0.63 MG/3ML nebulizer solution Take 3 mLs (0.63 mg total) by nebulization every 6 (six) hours. (Patient not taking: Reported on 08/10/2015) 3 mL 12  . meropenem 1 g in sodium chloride 0.9 % 100 mL Inject 1 g into the vein every 12 (twelve) hours. (Patient not taking: Reported on 08/10/2015)    . nitroGLYCERIN (NITROSTAT) 0.4 MG SL tablet Place 1 tablet (0.4 mg total) under the tongue every 5 (five) minutes as needed for chest pain (x 3 doses). (Patient not taking: Reported on 08/10/2015) 25 tablet 3  . pantoprazole sodium (PROTONIX) 40 mg/20 mL PACK Take 20 mLs (40 mg total) by mouth daily. (Patient not taking: Reported on 08/10/2015) 30 each   . pravastatin (PRAVACHOL) 20 MG  tablet TAKE 1 TABLET BY MOUTH DAILY (Patient not taking: Reported on 08/10/2015) 30 tablet 5  . predniSONE (DELTASONE) 10 MG tablet Take 5 tablets (50 mg total) by mouth daily. (Patient not taking: Reported on 08/10/2015)    . Vancomycin (VANCOCIN) 750 MG/150ML SOLN Inject 150 mLs (750 mg total) into the vein daily. (Patient not taking: Reported on 08/10/2015) 4000 mL   . [  DISCONTINUED] Calcium Carbonate-Vit D-Min (CALCIUM 1200 PO) Take 1 tablet by mouth daily.     . [DISCONTINUED] Cholecalciferol 1000 UNITS capsule Take 1,000 Units by mouth daily.     . [DISCONTINUED] cyclobenzaprine (FLEXERIL) 10 MG tablet Take 10 mg by mouth 3 (three) times daily as needed for muscle spasms.    . [DISCONTINUED] fish oil-omega-3 fatty acids 1000 MG capsule Take 1 g by mouth daily.     . [DISCONTINUED] folic acid (FOLVITE) 1 MG tablet Take 1 mg by mouth daily.     . [DISCONTINUED] InFLIXimab (REMICADE IV) Inject 100 mg into the vein See admin instructions. Takes every 2 months    . [DISCONTINUED] VOLTAREN 1 % GEL Takes 1 application topically daily as needed for pain  0    Home: Home Living Family/patient expects to be discharged to:: Inpatient rehab Living Arrangements: Spouse/significant other Available Help at Discharge: Family, Available PRN/intermittently Type of Home: House Home Access: Stairs to enter [employer] of Steps: 1 Entrance Stairs-Rails: None Home Layout: One level Bathroom Shower/Tub: Gaffer, Charity fundraiser: Standard Bathroom Accessibility: Yes Home Equipment: Environmental consultant - 2 wheels, Bedside commode  Functional History: Prior Function Level of Independence: Independent Comments: prior to back surgery on 2/2, pt drives tractor trailers, completely independent. At [facility] (prior to admission to hospital), pt was barely standing with assist and transferring to chair.  Functional Status:  Mobility: Bed Mobility Overal  bed mobility: Needs Assistance Bed Mobility: Rolling, Sidelying to Sit Rolling: Min guard Sidelying to sit: Mod assist, HOB elevated General bed mobility comments: Good demo of log roll technique- cues for technique. Mod A to elevate trunk. not able to perform without assist.  Transfers Overall transfer level: Needs assistance Equipment used: Rolling walker (2 wheeled) Sit to Stand: Mod assist, +2 physical assistance General transfer comment: Assist of 2 to boost from EOB with cues for hand placement/technique. Pt with heavy posterior bias in standing despite cues for anterior weight shift- anxious? fear of falling? Stood x2 from EOB. Transferred to chair with Left knee buckling. 2/4 DOE. Ambulation/Gait Ambulation/Gait assistance: Mod assist, +2 physical assistance Ambulation Distance (Feet): 3 Feet Assistive device: Rolling walker (2 wheeled) Gait Pattern/deviations: Shuffle, Step-to pattern, Decreased stride length, Decreased step length - right, Decreased step length - left General Gait Details: Able to take a few steps forward and towards the chair with bil knee weakness noted and left knee buckling. Assist of 2 for safety. Posterior lean.  Gait velocity: decreased    ADL:    Cognition: Cognition Overall Cognitive Status: Within Functional Limits for tasks assessed (Family states mentation is closing on baseline but still some minor confusion- much improved. ) Orientation Level: Oriented to person, Disoriented to place, Disoriented to time, Disoriented to situation Cognition Arousal/Alertness: Awake/alert Behavior During Therapy: WFL for tasks assessed/performed Overall Cognitive Status: Within Functional Limits for tasks assessed (Family states mentation is closing on baseline but still some minor confusion- much improved. ) Area of Impairment: Orientation (Able to state "hospital in [place]" and correct date, month and year. )  Blood pressure 109/52, pulse 97, temperature  97 F (36.1 C), temperature source Oral, resp. rate 20, height 5\' 8"  (1.727 m), weight 77.8 kg (171 lb 8.3 oz), SpO2 98 %. Physical Exam  Nursing note and vitals reviewed. Constitutional: He appears well-developed and well-nourished.  HENT:  Head: Normocephalic and atraumatic.  Eyes: Conjunctivae and EOM are normal. Pupils are equal, round, and reactive to light.  Neck: Normal range of motion.  Cardiovascular: Normal rate, regular rhythm, S1 normal and S2 normal.  Murmur heard. Systolic murmur is present with a grade of 3/6  Respiratory: Effort normal and breath sounds normal. No respiratory distress. He has no rales.  GI: Soft. Bowel sounds are normal. He exhibits no distension. There is no tenderness. There is no rebound and no guarding.  Neurological: He is alert. He displays atrophy. No sensory deficit. He exhibits normal muscle tone. Gait abnormal.  Motor strength is 4+/5 bilateral deltoids, biceps, triceps, grip 4 minus/5 bilateral hip flexor and knee extensor and ankle dorsiflexor and plantar flexor Bilateral calf atrophy  Skin: Skin is dry. He is not diaphoretic.  Psychiatric: His affect is blunt. His speech is delayed. He is slowed.  Oriented to person and place but not time   Lab Results Last 24 Hours    Results for orders placed or performed during the hospital encounter of 08/10/15 (from the past 24 hour(s))  Urinalysis, Routine w reflex microscopic (not at [facility]) Status: None   Collection Time: 08/11/15 2:06 PM  Result Value Ref Range   Color, Urine YELLOW YELLOW   APPearance CLEAR CLEAR   Specific Gravity, Urine 1.012 1.005 - 1.030   pH 5.5 5.0 - 8.0   Glucose, UA NEGATIVE NEGATIVE mg/dL   Hgb urine dipstick NEGATIVE NEGATIVE   Bilirubin Urine NEGATIVE NEGATIVE   Ketones, ur NEGATIVE NEGATIVE mg/dL   Protein, ur NEGATIVE NEGATIVE mg/dL   Nitrite NEGATIVE NEGATIVE   Leukocytes, UA NEGATIVE NEGATIVE  Lactic  acid, plasma Status: None   Collection Time: 08/11/15 2:40 PM  Result Value Ref Range   Lactic Acid, Venous 2.0 0.5 - 2.0 mmol/L  Basic metabolic panel Status: Abnormal   Collection Time: 08/11/15 2:40 PM  Result Value Ref Range   Sodium 150 (H) 135 - 145 mmol/L   Potassium 4.1 3.5 - 5.1 mmol/L   Chloride 115 (H) 101 - 111 mmol/L   CO2 24 22 - 32 mmol/L   Glucose, Bld 236 (H) 65 - 99 mg/dL   BUN 44 (H) 6 - 20 mg/dL   Creatinine, Ser 2.12 (H) 0.61 - 1.24 mg/dL   Calcium 7.9 (L) 8.9 - 10.3 mg/dL   GFR calc non Af Amer 29 (L) >60 mL/min   GFR calc Af Amer 33 (L) >60 mL/min   Anion gap 11 5 - 15  CBC Status: Abnormal   Collection Time: 08/11/15 2:40 PM  Result Value Ref Range   WBC 9.4 4.0 - 10.5 K/uL   RBC 3.55 (L) 4.22 - 5.81 MIL/uL   Hemoglobin 10.5 (L) 13.0 - 17.0 g/dL   HCT 35.4 (L) 39.0 - 52.0 %   MCV 99.7 78.0 - 100.0 fL   MCH 29.6 26.0 - 34.0 pg   MCHC 29.7 (L) 30.0 - 36.0 g/dL   RDW 16.5 (H) 11.5 - 15.5 %   Platelets 216 150 - 400 K/uL  Magnesium Status: None   Collection Time: 08/11/15 2:40 PM  Result Value Ref Range   Magnesium 1.8 1.7 - 2.4 mg/dL  Troponin I (q 6hr x 3) Status: Abnormal   Collection Time: 08/11/15 2:40 PM  Result Value Ref Range   Troponin I 0.05 (H) <0.031 ng/mL  Glucose, capillary Status: Abnormal   Collection Time: 08/11/15 3:48 PM  Result Value Ref Range   Glucose-Capillary 217 (H) 65 - 99 mg/dL   Comment 1 Notify RN    Comment 2 Document in Chart   Lactic acid, plasma Status: None  Collection Time: 08/11/15 5:10 PM  Result Value Ref Range   Lactic Acid, Venous 1.4 0.5 - 2.0 mmol/L  Glucose, capillary Status: Abnormal   Collection Time: 08/11/15 9:26 PM  Result Value Ref Range   Glucose-Capillary 271 (H) 65 - 99 mg/dL  Protime-INR  Status: Abnormal   Collection Time: 08/12/15 4:33 AM  Result Value Ref Range   Prothrombin Time 36.1 (H) 11.6 - 15.2 seconds   INR 3.73 (H) 0.00 - 1.49  CBC Status: Abnormal   Collection Time: 08/12/15 4:33 AM  Result Value Ref Range   WBC 7.0 4.0 - 10.5 K/uL   RBC 2.97 (L) 4.22 - 5.81 MIL/uL   Hemoglobin 8.9 (L) 13.0 - 17.0 g/dL   HCT 29.2 (L) 39.0 - 52.0 %   MCV 98.3 78.0 - 100.0 fL   MCH 30.0 26.0 - 34.0 pg   MCHC 30.5 30.0 - 36.0 g/dL   RDW 16.5 (H) 11.5 - 15.5 %   Platelets 186 150 - 400 K/uL  Glucose, capillary Status: Abnormal   Collection Time: 08/12/15 6:16 AM  Result Value Ref Range   Glucose-Capillary 315 (H) 65 - 99 mg/dL  Basic metabolic panel Status: Abnormal   Collection Time: 08/12/15 6:35 AM  Result Value Ref Range   Sodium 135 135 - 145 mmol/L   Potassium 3.7 3.5 - 5.1 mmol/L   Chloride 105 101 - 111 mmol/L   CO2 24 22 - 32 mmol/L   Glucose, Bld 582 (HH) 65 - 99 mg/dL   BUN 38 (H) 6 - 20 mg/dL   Creatinine, Ser 1.65 (H) 0.61 - 1.24 mg/dL   Calcium 7.2 (L) 8.9 - 10.3 mg/dL   GFR calc non Af Amer 39 (L) >60 mL/min   GFR calc Af Amer 45 (L) >60 mL/min   Anion gap 6 5 - 15  Glucose, capillary Status: Abnormal   Collection Time: 08/12/15 7:49 AM  Result Value Ref Range   Glucose-Capillary 302 (H) 65 - 99 mg/dL  Brain natriuretic peptide Status: Abnormal   Collection Time: 08/12/15 10:40 AM  Result Value Ref Range   B Natriuretic Peptide 222.8 (H) 0.0 - 100.0 pg/mL      Imaging Results (Last 48 hours)    Portable Chest 1 View  08/10/2015 CLINICAL DATA: Shortness of breath EXAM: PORTABLE CHEST 1 VIEW COMPARISON: 08/05/2015 FINDINGS: Low lung volumes with bibasilar atelectasis, worse on the left. Stable cardiomegaly without edema or effusion. No pneumothorax. Right PICC line tip mid SVC level.  Residual scoliosis of the spine. Thoracolumbar fusion hardware partially imaged. Trachea is midline. IMPRESSION: Cardiomegaly with basilar atelectasis, worse on the left. Electronically Signed By: Jerilynn Mages. Shick M.D. On: 08/10/2015 18:35     Assessment/Plan: Diagnosis:Encephalopathy and Deconditioning after bending dependent respiratory failure resulting from pneumonia and sepsis 1. Does the need for close, 24 hr/day medical supervision in concert with the patient's rehab needs make it unreasonable for this patient to be served in a less intensive setting? Yes 2. Co-Morbidities requiring supervision/potential complications: Dysphonic, history of coronary artery disease, history of psoriatic arthritis, atrial fibrillation, history of CHF, encephalopathy, Dysphagia 3. Due to bladder management, bowel management, safety, skin/wound care, disease management, medication administration, pain management and patient education, does the patient require 24 hr/day rehab nursing? Yes 4. Does the patient require coordinated care of a physician, rehab nurse, PT (1-2 hrs/day, 5 days/week), OT (1-2 hrs/day, 5 days/week) and SLP (0.5-1 hrs/day, 5 days/week) to address physical and functional deficits in the context of the above  medical diagnosis(es)? Yes Addressing deficits in the following areas: balance, endurance, locomotion, strength, transferring, bowel/bladder control, bathing, dressing, feeding, grooming, toileting, cognition and swallowing 5. Can the patient actively participate in an intensive therapy program of at least 3 hrs of therapy per day at least 5 days per week? Yes 6. The potential for patient to make measurable gains while on inpatient rehab is good 7. Anticipated functional outcomes upon discharge from inpatient rehab are modified independent and supervision with PT, modified independent and supervision with OT, modified independent and supervision with SLP. 8. Estimated rehab length of stay to  reach the above functional goals is: 18-22 days 9. Does the patient have adequate social supports and living environment to accommodate these discharge functional goals? Yes 10. Anticipated D/C setting: Home 11. Anticipated post D/C treatments: [facility] therapy 12. Overall Rehab/Functional Prognosis: good  RECOMMENDATIONS: This patient's condition is appropriate for continued rehabilitative care in the following setting: CIR Patient has agreed to participate in recommended program. Yes Note that insurance prior authorization may be required for reimbursement for recommended care.  Comment:     08/12/2015       Revision History     Date/Time User Provider Type Action   08/12/2015 4:59 PM Charlett Blake, MD Physician Sign   08/12/2015 1:13 PM Bary Leriche, PA-C Physician Assistant Pend   View Details Report       Routing History     Date/Time From To Method   08/12/2015 4:59 PM Charlett Blake, MD Charlett Blake, MD In Basket   08/12/2015 4:59 PM Charlett Blake, MD Abner Greenspan, MD In [place]

## 2015-08-18 NOTE — Progress Notes (Signed)
[facility] PHYSICAL MEDICINE & REHABILITATION     PROGRESS NOTE    Subjective/Complaints:   Objective: Vital Signs: Blood pressure 133/73, pulse 93, temperature 97.5 F (36.4 C), temperature source Oral, resp. rate 17, weight 77.3 kg (170 lb 6.7 oz), SpO2 96 %. Dg Chest 2 View  08/17/2015  CLINICAL DATA:  Cough.  Recent back surgery. EXAM: CHEST  2 VIEW COMPARISON:  08/12/2015 FINDINGS: The heart size and mediastinal contours are within normal limits. Mild subsegmental atelectasis in the left lower lung shows no significant change. No evidence of pneumothorax or pleural effusion. No evidence of pulmonary consolidation or edema. Right arm PICC line is seen in appropriate position. Posterior thoracolumbar spine fusion hardware again noted. IMPRESSION: Mild left lower lobe atelectasis, without significant change. No evidence of pulmonary consolidation or pleural effusion. Electronically Signed   By: Earle Gell M.D.   On: 08/17/2015 18:19    Recent Labs  08/18/15 0516  WBC 11.2*  HGB 11.2*  HCT 34.3*  PLT 290    Recent Labs  08/17/15 0500 08/18/15 0516  NA 139 138  K 4.1 4.3  CL 104 103  GLUCOSE 141* 131*  BUN 15 15  CREATININE 1.10 1.27*  CALCIUM 8.9 8.8*   CBG (last 3)   Recent Labs  08/17/15 1709 08/17/15 2125 08/18/15 0657  GLUCAP 235* 254* 144*    Wt Readings from Last 3 Encounters:  08/18/15 77.3 kg (170 lb 6.7 oz)  08/17/15 75.3 kg (166 lb 0.1 oz)  08/03/15 80.1 kg (176 lb 9.4 oz)    Physical Exam:  Constitutional: He is oriented to person, place, and time. He appears well-developed and well-nourished. No distress.  HENT: oral mucosa moist Head: Normocephalic.  Mouth/Throat: No oropharyngeal exudate.  Eyes: Conjunctivae and EOM are normal. Pupils are equal, round, and reactive to light.  Neck: Normal range of motion. Neck supple.  Cardiovascular: Normal rate. An irregularly irregular rhythm present.  Respiratory: Effort normal and breath sounds  normal. No respiratory distress. He has no wheezes. He exhibits no tenderness.  GI: He exhibits no distension. There is no tenderness.  Musculoskeletal: He exhibits no edema or tenderness.  Neurological: He is alert and oriented to person, place, and time. No cranial nerve deficit. Coordination normal.  Able to state date without difficulty. Able to follow simple motor commands. More alert and appropriate today. UE 5/5 prox to distal. LE: 2+ to 3/5 Hf, 3+ke and 4/5 adf/apf bilaterally. No focal sensory deficits.  Skin: Skin is warm and dry. He is not diaphoretic.  Dry  skin.  Dehisced back incision with yellow eschar in wound bed---stable with minimal drainage    Psychiatric: He has a normal mood and affect. His behavior is normal.   Assessment/Plan: 1. 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 Physiatrist is providing close team supervision and 24 hour management of active medical problems listed below. Physiatrist and rehab team continue to assess barriers to discharge/monitor patient progress toward functional and medical goals.  Function:  Bathing Bathing position      Bathing parts      Bathing assist        Upper Body Dressing/Undressing Upper body dressing                    Upper body assist        Lower Body Dressing/Undressing Lower body dressing  Lower body assist        Toileting Toileting          Toileting assist     Transfers Chair/bed Clinical biochemist          Cognition Comprehension    Expression    Social Interaction    Problem Solving    Memory     Medical Problem List and Plan: 1. Debilitation and cognitive deficits secondary to encephalopathy and multiple medical complications after  lumbar discectomy  -begin CIR therapies 2. DVT Prophylaxis/Anticoagulation: Pharmaceutical: Coumadin. Appreciate pharmacy assist. INR therapeutic 3. Chronic back pain/Pain Management: tylenol only at present 4. Mood: LCSW to follow for evaluation and support.  5. Neuropsych: This patient is not yet capable of making decisions on his own behalf. 6. Skin/Wound Care: Dry dressing daily. Monitor wound for healing. Added protein supplement to help with low protein stores 7. Fluids/Electrolytes/Nutrition: Continue IVF at night to maintain adequate hydration status. All labs personally reviewed today. 8. Acute renal failure: Cr sl elevated. Continue to encourage po honey thick liquids.   -recheck bmet tomorrow  -IVF HS. 9. A fib with RVR: Monitor HR bid. On amiodarone and coumadin --monitor for any symptoms with activity.  10. PMR/RA: Has completed stress dose steroids--now on maintaince dose.  11. Anemia of chronic illness/iron deficiency: On iron supplement. hgb 11.2 12. GERD: protonix 13. DMT2: Currently on Lantus 10 units at bedtime but used glucotrol 5 mg daily at home . Monitor BS ac/hs basis with use SSI for now. Transition to oral hypoglycemic when intake is more consistent. Sugars higher in PM yesterday  -follow for pattern today 14. CAD: Off ASA/plavix-- now on coumadin and lipitor.  15. Dysphagia: Dysphagia 1, honey liquids by tsp. Needs full supervision at meals. IVF at nights   16. Cough: no signs this morning  -CXR personally reviewed. ?atelectasis left base  -encourage IS, OoB.   LOS (Days) 1 A FACE TO FACE EVALUATION WAS PERFORMED  Ileah Falkenstein T 08/18/2015 8:37 AM

## 2015-08-18 NOTE — Progress Notes (Signed)
Patient information reviewed and entered into eRehab system by Skyann Ganim, RN, CRRN, PPS Coordinator.  Information including medical coding and functional independence measure will be reviewed and updated through discharge.     Per nursing patient was given "Data Collection Information Summary for Patients in Inpatient Rehabilitation Facilities with attached "Privacy Act Statement-Health Care Records" upon admission.  

## 2015-08-18 NOTE — Progress Notes (Addendum)
ANTICOAGULATION CONSULT NOTE - Follow Up Consult  Pharmacy Consult for coumadin Indication: atrial fibrillation  Allergies  Allergen Reactions  . Ace Inhibitors Other (See Comments)    Causes high K  . Angiotensin Receptor Blockers Other (See Comments)    Causes high K  . Metoprolol Other (See Comments)    May cause elevated K level  . Penicillins Swelling and Rash  . Tetracycline Swelling and Rash    Patient Measurements: Weight: 170 lb 6.7 oz (77.3 kg) Heparin Dosing Weight:   Vital Signs: Temp: 97.5 F (36.4 C) (03/08 0458) Temp Source: Oral (03/08 0458) BP: 133/73 mmHg (03/08 0458) Pulse Rate: 93 (03/08 0458)  Labs:  Recent Labs  08/16/15 0510 08/17/15 0500 08/18/15 0516  HGB  --   --  11.2*  HCT  --   --  34.3*  PLT  --   --  290  LABPROT 32.1* 24.9* 24.4*  INR 3.20* 2.28* 2.22*  CREATININE 1.09 1.10 1.27*    Estimated Creatinine Clearance: 47.9 mL/min (by C-G formula based on Cr of 1.27).   Medications:  Scheduled:  . amiodarone  400 mg Oral Daily  . antiseptic oral rinse  7 mL Mouth Rinse BID  . atorvastatin  20 mg Oral q1800  . feeding supplement (GLUCERNA SHAKE)  237 mL Oral TID WC  . ferrous sulfate  325 mg Oral TID WC  . insulin aspart  0-15 Units Subcutaneous TID WC  . insulin aspart  0-5 Units Subcutaneous QHS  . insulin glargine  10 Units Subcutaneous QHS  . pantoprazole  40 mg Oral Daily  . predniSONE  10 mg Oral Q breakfast  . senna  2 tablet Oral Daily  . sodium chloride 0.9 % 1,000 mL infusion   Intravenous Daily  . sodium chloride flush  10-40 mL Intracatheter Q12H  . thiamine  100 mg Oral Daily  . Warfarin - Pharmacist Dosing Inpatient   Does not apply q1800   Infusions:    Assessment: 75 yo male with afib is currently on therapeutic coumadin.  INR today is 2.22 but got vitamin K few days ago; on amiodarone.  Goal of Therapy:  INR 2-3 Monitor platelets by anticoagulation protocol: Yes   Plan:  - coumadin 5 mg po x1 - INR  in am  Lilac Hoff, Tsz-Yin 08/18/2015,8:47 AM
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
## 2015-08-18 NOTE — Evaluation (Signed)
Physical Therapy Assessment and Plan  Patient Details  Name: Curtis Frazier MRN: [id number] Date of Birth: [birth year]  PT Diagnosis: Difficulty walking, Impaired sensation, Low back pain and Muscle weakness Rehab Potential: Good ELOS: 14-16 days   Today's Date: 08/18/2015 PT Individual Time: 0800-0900 PT Individual Time Calculation (min): 60 min    Problem List:  Patient Active Problem List   Diagnosis Date Noted  . Encephalopathy, metabolic 50/38/8828  . Debility   . 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 Dysphagia   . Spondylosis of lumbar region without myelopathy or radiculopathy   . Thrush 08/12/2015  . Urinary retention 08/12/2015  . Hypotension 08/11/2015  . Hypernatremia 08/10/2015  . Encephalopathy 08/10/2015  . Acute on chronic renal failure ([place]) 08/10/2015  . Chronic diastolic congestive heart failure ([place]) 08/10/2015  . Persistent atrial fibrillation ([place])   . Acute diastolic congestive heart failure ([place])   . Dyspnea   . Disorientation   . Slow transit constipation   . Back pain without radiation   . Counseling regarding advanced care planning and goals of care   . PAF (paroxysmal atrial fibrillation) ([place]) 07/29/2015  . Advanced care planning/counseling discussion   . Pneumonia   . Change in mental state   . Endotracheally intubated   . CHF (congestive heart failure) ([place])   . Palliative care encounter 07/21/2015  . DNR (do not resuscitate) discussion 07/21/2015  . HCAP (healthcare-associated pneumonia)   . Acute respiratory failure ([place])   . Acute respiratory failure with hypoxia ([place]) 07/18/2015  . Atrial fibrillation with RVR ([place]) 07/18/2015  . Acute pulmonary edema ([place]) 07/18/2015  . Sepsis ([place]) 07/17/2015  . Acute encephalopathy 07/17/2015  . LLL pneumonia 07/17/2015  . Type II or unspecified type diabetes mellitus without mention of complication, not stated as  uncontrolled 07/17/2015  . S/P lumbar laminectomy 07/17/2015  . Elevated troponin 07/17/2015  . Type 2 diabetes mellitus without complication, without long-term current use of insulin ([place])   . Acute bronchitis 03/05/2015  . Back pain, thoracic 01/15/2015  . Chest wall pain 01/15/2015  . Routine general medical examination at a health care facility 11/23/2014  . Productive cough 10/14/2014  . Left flank pain 09/25/2014  . Renal insufficiency 11/14/2013  . Encounter for Medicare annual wellness exam 07/07/2013  . BPH (benign prostatic hyperplasia) 07/05/2012  . Prostate cancer screening 06/27/2012  . Low TSH level 01/22/2012  . PMR (polymyalgia rheumatica) (HCC) 01/20/2012  . Aortic stenosis 07/08/2011  . New onset a-fib ([place]) 07/05/2011    Class: Acute  . HYPERKALEMIA 02/18/2010  . Essential hypertension, benign 09/13/2009  . ANEMIA-UNSPECIFIED 06/10/2009  . ANGIODYSPLASIA-INTESTINE 06/10/2009  . MURMUR 05/21/2009  . Left carotid bruit 05/21/2009  . Coronary atherosclerosis 03/09/2009  . GERD 03/09/2009  . Rheumatoid arthritis ([place]) 04/29/2008  . HX, PERSONAL, COLONIC POLYPS 01/17/2007  . Diabetes mellitus type 2, controlled, without complications ([place]) 00/34/9179  . Elevated lipids 09/18/2006  . PSORIATIC ARTHRITIS 09/18/2006  . PSORIASIS 09/18/2006  . 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 DEGENERATIVE DISC DISEASE 09/18/2006  . SCOLIOSIS 09/18/2006  . TOBACCO ABUSE, HX OF 09/18/2006    Past Medical History:  Past Medical History  Diagnosis Date  . Other and unspecified hyperlipidemia   . Coronary atherosclerosis of unspecified type of vessel, native or graft   . Personal history of colonic polyps   . Actinic keratosis   . Psoriatic arthropathy ([place])   .  Other psoriasis   . Scoliosis (and kyphoscoliosis), idiopathic   . GI bleed 12/10    cecam AVM  . Gastritis 12/10  . Hyperlipidemia     takes Pravastatin daily  . Hypertension     takes Metoprolol daily  . Shortness  of breath     with exertion  . Headache(784.0)     occasionally  . Joint pain   . Joint swelling   . Chronic back pain     scoliosis/stenosis/radiculopathy,degenerative disc disease  . Psoriasis   . Bruises easily   . Esophageal reflux     takes Omeprazole daily  . History of colonic polyps   . Hemorrhoids   . Kidney stone     hx of  . Blood transfusion 2010  . PMR (polymyalgia rheumatica) (HCC) 01/20/2012    tx by specialist on a long prednisone taper  . S/P PTCA (percutaneous transluminal coronary angioplasty)   . Rheumatoid arthritis(714.0)     takes Methotrexate 7pills weekly  . Degeneration of intervertebral disc, site unspecified   . Myocardial infarction ([place]) 2010  . Pneumonia 2008; 07/2015  . Type II or unspecified type diabetes mellitus without mention of complication, not stated as uncontrolled   . Skin cancer     "arms"   Past Surgical History:  Past Surgical History  Procedure Laterality Date  . Rotator cuff repair Left 2000  . Colonoscopy    . Vasectomy  1979  . Coronary angioplasty with stent placement  02/2009    has one stent  . Carotid doppler  10/12    0-39% R and 60-79% left   . Cataract extraction  2012  . Cardiac catheterization  2010  . Lithotripsy  2009  . Tee without cardioversion N/A 08/06/2015    Procedure: TRANSESOPHAGEAL ECHOCARDIOGRAM (TEE);  Surgeon: Skeet Latch, MD;  Location: [place] [facility];  Service: Cardiovascular;  Laterality: N/A;  . Cardioversion N/A 08/06/2015    Procedure: CARDIOVERSION;  Surgeon: Skeet Latch, MD;  Location: [facility] ENDOSCOPY;  Service: Cardiovascular;  Laterality: N/A;  . Back surgery    . Lumbar laminectomy  07/2015  . Posterior laminectomy thoracic spine      "fixed the discs"  . Skin cancer excision      "arms"    Assessment & Plan Clinical Impression: Curtis Frazier is a 75 y.o. male with history of CAD, psoriatic arthropathy, DMT2, RA, lumbar stenosis with outpatient lumbar discectomy on 07/15/15 who was  admitted on 02/4 with sepsis due to PNA, mental status changes and A fib with RVR. hospital course complicated by VDRF, acute on chronic CHF, cardioversion for management of A fib, hypoxia requiring face mask as well as confusion with encephalopathy. He was transfered to [facility] on 02/21 but re-admitted to acute hospital on 02/28 with ongoing encephalopathy, worsening of lethargy, poor po intake and acute on chronic renal failure with hypernatremia. He was started on IV antibiotics as well as IVF due to hypotension/sepsis. Neurology consulted for input on confusion and asterixis and recommended MRI brain as well as EEG for workup. EEG done revealing mild slowing but no seizures. MRI of brain done revealing atrophy and small vessel disease with chronic and question acute right maxillary disease. Encephalopathy felt to be due to metabolic derangement. BP medications held due to orthostatic changes, acute renal failure has resolved and mentation is slowly improving. Follow up CXR 3/3 shows no edema or infiltrate and he has been weaned off oxygen. FEES done 3/2 and dysphagia 1, honey liquids by  spoon recommended for moderate pharyngeal dysphagia as well a pharyngeal strengthening exercises. Back wound dehiscence noted on 03/06 and Dr. Arnoldo Morale recommends daily dressing changes with follow up in 2 weeks. Patient transferred to CIR on 08/17/2015.   Patient currently requires mod with mobility secondary to muscle weakness and muscle joint tightness, decreased cardiorespiratoy endurance and decreased standing balance, decreased postural control and decreased balance strategies.  Prior to hospitalization, patient was independent  with mobility and lived with Spouse in a House home.  Home access is 1 (from garage)Stairs to enter.  Patient will benefit from skilled PT intervention to maximize safe functional mobility, minimize fall risk and decrease caregiver burden for planned discharge home with intermittent assist.   Anticipate patient will benefit from follow up [facility] at discharge.  PT - End of Session Activity Tolerance: Decreased this session;Tolerates < 10 min activity with changes in vital signs Endurance Deficit: Yes Endurance Deficit Description: SOB and increased HR with minimal activity, required frequent rest breaks PT Assessment Rehab Potential (ACUTE/IP ONLY): Good Barriers to Discharge: Decreased caregiver support Barriers to Discharge Comments: wife has back issues PT Patient demonstrates impairments in the following area(s): Balance;Endurance;Motor;Nutrition;Pain;Safety;Sensory PT Transfers Functional Problem(s): Bed Mobility;Bed to Chair;Car;Furniture PT Locomotion Functional Problem(s): Ambulation;Wheelchair Mobility;Stairs PT Plan PT Intensity: Minimum of 1-2 x/day ,45 to 90 minutes PT Frequency: 5 out of 7 days PT Duration Estimated Length of Stay: 14-16 days PT Treatment/Interventions: Ambulation/gait training;Balance/vestibular training;Community reintegration;Discharge planning;Disease management/prevention;DME/adaptive equipment instruction;Functional mobility training;Neuromuscular re-education;Pain management;Patient/family education;Psychosocial support;Stair training;Therapeutic Activities;Therapeutic Exercise;UE/LE Coordination activities;UE/LE Strength taining/ROM;Wheelchair propulsion/positioning PT Transfers Anticipated Outcome(s): mod I  PT Locomotion Anticipated Outcome(s): mod I household PT Recommendation Follow Up Recommendations: Home health PT Patient destination: Home Equipment Recommended: To be determined Equipment Details: pt owns RW  Skilled Therapeutic Intervention Skilled therapeutic intervention initiated after completion of evaluation. Discussed with patient falls risk, safety within room, and focus of therapy during stay. Discussed possible length of stay, goals, and follow-up therapy. Patient's son present at end of evaluation. Patient required mod A for  sit <> stand, min A for gait using RW x 16 ft, and min A x 2 steps. Patient fatigued quickly and became SOB with increased HR with minimal activity, requiring multiple prolonged seated rest breaks throughout session. Patient deferred simulated car transfer at this time due to fatigue. Patient left sitting in wheelchair with all needs within reach and son in room.   PT Evaluation Precautions/Restrictions Precautions Precautions: Fall;Back Precaution Comments: Recent spine surgery 2/2 Restrictions Weight Bearing Restrictions: No General Chart Reviewed: Yes Family/Caregiver Present: No  Pain Pain Assessment Pain Assessment: No/denies pain Home Living/Prior Functioning Home Living Available Help at Discharge: Family;Available 24 hours/day Type of Home: House Home Access: Stairs to enter [employer] of Steps: 1 (from garage) Entrance Stairs-Rails: None Home Layout: One level Bathroom Shower/Tub: Walk-in shower;Door [employer] Toilet: Handicapped height Bathroom Accessibility: Yes  Lives With: Spouse Prior Function Level of Independence: Independent with basic ADLs;Independent with gait  Able to Take Stairs?: Yes Driving: Yes Vocation: Part time employment 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 Leisure: Hobbies-yes (Comment) Comments: used cane outside the home sometimes, likes country music Vision/Perception  Perception Comments: WFL  Cognition Overall Cognitive Status: Impaired/Different from baseline Arousal/Alertness: Awake/alert Orientation Level: Oriented X4 Attention: Selective Selective Attention: Impaired Selective Attention Impairment: Functional basic;Verbal basic Memory: Impaired Memory Impairment: Decreased recall of new information;Decreased short term memory Decreased Short Term Memory: Verbal basic Awareness: Appears intact Safety/Judgment: Appears intact Sensation Sensation Light Touch: Impaired Detail Light Touch  Impaired  Details: Impaired LLE;Impaired RLE Hot/Cold: Appears Intact Proprioception: Appears Intact Additional Comments: numbness and tingling in B feet Coordination Gross Motor Movements are Fluid and Coordinated: Yes Fine Motor Movements are Fluid and Coordinated: Yes Heel Shin Test: very limited ROM due to weakness, coordination appears [facility] Motor  Motor Motor: Within Functional Limits Motor - Skilled Clinical Observations: generalized weakness and deconditioning  Mobility Bed Mobility Bed Mobility: Supine to Sit Supine to Sit: With rails;5: Supervision Transfers Transfers: Yes Sit to Stand: 3: Mod assist;With upper extremity assist Stand to Sit: 3: Mod assist;With upper extremity assist Squat Pivot Transfers: 4: Min assist;With upper extremity assistance Locomotion  Ambulation Ambulation: Yes Ambulation/Gait Assistance: 4: Min assist;3: Mod assist Ambulation Distance (Feet): 16 Feet Assistive device: Rolling walker Gait Gait: Yes Gait Pattern: Impaired Gait Pattern: Step-through pattern;Decreased stride length;Right flexed knee in stance;Left flexed knee in stance;Decreased trunk rotation;Trunk flexed Gait velocity: decreased Stairs / Additional Locomotion Stairs: Yes Stairs Assistance: 4: Min assist Stairs Assistance Details: Verbal cues for technique Stair Management Technique: Two rails;Step to pattern;Backwards;Forwards (ascend forward, descend backward) Number of Stairs: 2 Height of Stairs: 3 Wheelchair Mobility Wheelchair Mobility: No (limited by time constraints)  Trunk/Postural Assessment  Cervical Assessment Cervical Assessment: Within Functional Limits Thoracic Assessment Thoracic Assessment: Within Functional Limits Lumbar Assessment Lumbar Assessment: Exceptions to [facility] (lumbar sx, back precautions) Postural Control Postural Control: Within Functional Limits  Balance Dynamic Sitting Balance Dynamic Sitting - Level of Assistance: 5: Stand by assistance Static  Standing Balance Static Standing - Level of Assistance: 4: Min assist Dynamic Standing Balance Dynamic Standing - Level of Assistance: 3: Mod assist (posterior lean) Extremity Assessment  RUE Assessment RUE Assessment: Within Functional Limits LUE Assessment LUE Assessment: Within Functional Limits RLE Assessment RLE Assessment: Exceptions to [facility] RLE Strength RLE Overall Strength: Deficits RLE Overall Strength Comments: 2+/5 hip flexion, 3+/5 knee extension, 4/5 knee flexion and ankle DF/PF LLE Assessment LLE Assessment: Exceptions to [facility] LLE Strength LLE Overall Strength: Deficits LLE Overall Strength Comments: 2+/5 hip flexion, 3+/5 knee extension, 4/5 knee flexion and ankle DF/PF   See Function Navigator for Current Functional Status.   Refer to Care Plan for Long Term Goals  Recommendations for other services: None  Discharge Criteria: Patient will be discharged from PT if patient refuses treatment 3 consecutive times without medical reason, if treatment goals not met, if there is a change in medical status, if patient makes no progress towards goals or if patient is discharged from hospital.  The above assessment, treatment plan, treatment alternatives and goals were discussed and mutually agreed upon: by patient  [name] 08/18/2015, 9:08 AM

## 2015-08-19 ENCOUNTER — Telehealth: Payer: Self-pay | Admitting: Family Medicine

## 2015-08-19 ENCOUNTER — Inpatient Hospital Stay (HOSPITAL_COMMUNITY): Payer: Medicare Other

## 2015-08-19 ENCOUNTER — Inpatient Hospital Stay (HOSPITAL_COMMUNITY): Payer: Medicare Other | Admitting: Physical Therapy

## 2015-08-19 ENCOUNTER — Inpatient Hospital Stay (HOSPITAL_COMMUNITY): Payer: Medicare Other | Admitting: Speech Pathology

## 2015-08-19 ENCOUNTER — Inpatient Hospital Stay (HOSPITAL_COMMUNITY): Payer: Medicare Other | Admitting: Occupational Therapy

## 2015-08-19 DIAGNOSIS — N179 Acute kidney failure, unspecified: Secondary | ICD-10-CM

## 2015-08-19 DIAGNOSIS — I10 Essential (primary) hypertension: Secondary | ICD-10-CM

## 2015-08-19 DIAGNOSIS — R7989 Other specified abnormal findings of blood chemistry: Secondary | ICD-10-CM

## 2015-08-19 DIAGNOSIS — E119 Type 2 diabetes mellitus without complications: Secondary | ICD-10-CM

## 2015-08-19 DIAGNOSIS — N189 Chronic kidney disease, unspecified: Secondary | ICD-10-CM

## 2015-08-19 LAB — BASIC METABOLIC PANEL
ANION GAP: 9 (ref 5–15)
BUN: 17 mg/dL (ref 6–20)
CHLORIDE: 103 mmol/L (ref 101–111)
CO2: 25 mmol/L (ref 22–32)
Calcium: 8.5 mg/dL — ABNORMAL LOW (ref 8.9–10.3)
Creatinine, Ser: 1.28 mg/dL — ABNORMAL HIGH (ref 0.61–1.24)
GFR calc Af Amer: >60 mL/min (ref >60)
GFR, EST NON AFRICAN AMERICAN: 53 mL/min — AB (ref >60)
GLUCOSE: 88 mg/dL (ref 65–99)
POTASSIUM: 4 mmol/L (ref 3.5–5.1)
Sodium: 137 mmol/L (ref 135–145)

## 2015-08-19 LAB — GLUCOSE, CAPILLARY
GLUCOSE-CAPILLARY: 90 mg/dL (ref 65–99)
GLUCOSE-CAPILLARY: 218 mg/dL — AB (ref 65–99)
Glucose-Capillary: 274 mg/dL — ABNORMAL HIGH (ref 65–99)

## 2015-08-19 LAB — PROTIME-INR
INR: 3.25 — AB (ref 0.00–1.49)
Prothrombin Time: 32.5 seconds — ABNORMAL HIGH (ref 11.6–15.2)

## 2015-08-19 MED ORDER — SODIUM CHLORIDE 0.9 % IV SOLN
INTRAVENOUS | Status: DC
  Administered 2015-08-19 – 2015-08-22 (×4): via INTRAVENOUS
  Filled 2015-08-19: qty 1000

## 2015-08-19 NOTE — IPOC Note (Signed)
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, with long-term current use of insulin (HCC)   Dysphagia   Spondylosis of lumbar region without myelopathy or radiculopathy     Functional Problem List: Nursing Medication Management, Perception, Sensory, Skin Integrity, Bladder, Bowel  PT Balance, Endurance, Motor, Nutrition, Pain, Safety, Sensory  OT Balance, Endurance, Motor, Sensory  SLP Cognition, Nutrition  TR         Basic ADL's: OT Grooming, Bathing, Dressing, Toileting     Advanced  ADL's: OT Simple Meal Preparation, Light Housekeeping     Transfers: PT Bed Mobility, Bed to Chair, Car, Manufacturing systems engineer, Metallurgist: PT Ambulation, Emergency planning/management officer, Stairs     Additional Impairments: OT None  SLP Swallowing, Firefighter, Attention  TR      Anticipated Outcomes Item Anticipated Outcome  Self Feeding I  Swallowing  Supervision for use of strategies with least restrictive diet    Basic self-care  mod I  Toileting  mod I   Bathroom Transfers mod I to toilet, supervision to shower  Bowel/Bladder  Remain cont of bladder and bowel  Transfers  mod I   Locomotion  mod I household  Communication     Cognition  Supervision   Pain  Pain <3  Safety/Judgment  MIn A   Therapy Plan: PT Intensity: Minimum of 1-2 x/day ,45 to 90 minutes PT Frequency: 5 out of 7 days PT Duration Estimated Length of Stay: 14-16 days OT Intensity: Minimum of 1-2 x/day, 45 to 90 minutes OT Frequency: 5 out of 7 days OT Duration/Estimated Length of Stay: 14-16 days SLP Intensity: Minumum of 1-2 x/day, 30 to 90 minutes SLP Frequency: 3 to 5 out of 7 days SLP Duration/Estimated Length of Stay: 14-16 days         Team Interventions: Nursing Interventions Pain Management, Disease Management/Prevention, Skin Care/Wound Management, Medication Management, Cognitive Remediation/Compensation  PT interventions Ambulation/gait training, Training and development officer, Community reintegration, Discharge planning, Disease management/prevention, DME/adaptive equipment instruction, Functional mobility training, Neuromuscular re-education, Pain management, Patient/family education, Psychosocial support, Stair training, Therapeutic Activities, Therapeutic Exercise, UE/LE Coordination activities, UE/LE Strength taining/ROM, Wheelchair propulsion/positioning  OT Interventions Training and development officer, Discharge planning, DME/adaptive equipment instruction, Functional mobility training, Patient/family education, Self Care/advanced ADL retraining, Therapeutic Activities, Therapeutic Exercise, UE/LE Strength taining/ROM  SLP Interventions Cognitive remediation/compensation, Cueing hierarchy, Dysphagia/aspiration precaution training, Functional tasks, Patient/family education, Therapeutic Activities, Speech/Language facilitation, Therapeutic Exercise, Internal/external aids, Environmental controls  TR Interventions    SW/CM Interventions Discharge Planning, Psychosocial Support, Patient/Family Education    Team Discharge Planning: Destination: PT-Home ,OT- Home , SLP-Home Projected Follow-up: PT-Home health PT, OT-  Home health OT, SLP-Outpatient SLP, 24 hour supervision/assistance Projected Equipment Needs: PT-To be determined, OT- 3 in 1 bedside comode, Tub/shower seat, SLP-To be determined Equipment Details: PT-pt owns RW, OT-  Patient/family involved in discharge planning: PT- Patient,  OT-Patient, Family member/caregiver, SLP-Patient, Family member/caregiver  MD ELOS: 14-16 days Medical Rehab Prognosis:  Excellent Assessment: The patient has been admitted for CIR therapies with the diagnosis of  debility/encephalopath. The team will be addressing functional mobility, strength, stamina, balance, safety, adaptive techniques and equipment, self-care, bowel and bladder mgt, patient and caregiver education, NMR, back precautions, pain conttrol, cognition, ego support, community reintegration. Goals have  been set at mod I for mobility and self-care and min assist to supervision for safety and cogntion   Meredith Staggers, MD, [facility]      See Team Conference Notes for weekly updates to the plan of care

## 2015-08-19 NOTE — Progress Notes (Signed)
[place] PHYSICAL MEDICINE & REHABILITATION     PROGRESS NOTE    Subjective/Complaints: Had a good night. Was tired after therapy yesterday. Pain under control.   ROS: Pt denies fever, rash/itching, headache, blurred or double vision, nausea, vomiting, abdominal pain, diarrhea, chest pain, shortness of breath, palpitations, dysuria, dizziness, neck or back pain, bleeding, anxiety, or depression   Objective: Vital Signs: Blood pressure 109/63, pulse 92, temperature 98.5 F (36.9 C), temperature source Oral, resp. rate 18, weight 76.4 kg (168 lb 6.9 oz), SpO2 95 %. Dg Chest 2 View  08/17/2015  CLINICAL DATA:  Cough.  Recent back surgery. EXAM: CHEST  2 VIEW COMPARISON:  08/12/2015 FINDINGS: The heart size and mediastinal contours are within normal limits. Mild subsegmental atelectasis in the left lower lung shows no significant change. No evidence of pneumothorax or pleural effusion. No evidence of pulmonary consolidation or edema. Right arm PICC line is seen in appropriate position. Posterior thoracolumbar spine fusion hardware again noted. IMPRESSION: Mild left lower lobe atelectasis, without significant change. No evidence of pulmonary consolidation or pleural effusion. Electronically Signed   By: Earle Gell M.D.   On: 08/17/2015 18:19    Recent Labs  08/18/15 0516  WBC 11.2*  HGB 11.2*  HCT 34.3*  PLT 290    Recent Labs  08/18/15 0516 08/19/15 0448  NA 138 137  K 4.3 4.0  CL 103 103  GLUCOSE 131* 88  BUN 15 17  CREATININE 1.27* 1.28*  CALCIUM 8.8* 8.5*   CBG (last 3)   Recent Labs  08/18/15 1651 08/18/15 2107 08/19/15 0650  GLUCAP 280* 174* 90    Wt Readings from Last 3 Encounters:  08/18/15 76.4 kg (168 lb 6.9 oz)  08/17/15 75.3 kg (166 lb 0.1 oz)  08/03/15 80.1 kg (176 lb 9.4 oz)    Physical Exam:  Constitutional: He is oriented to person, place, and time. He appears well-developed and well-nourished. No distress.  HENT: oral mucosa moist Head:  Normocephalic.  Mouth/Throat: No oropharyngeal exudate.  Eyes: Conjunctivae and EOM are normal. Pupils are equal, round, and reactive to light.  Neck: Normal range of motion. Neck supple.  Cardiovascular: Normal rate. An irregularly irregular rhythm present.  Respiratory: Effort normal and breath sounds normal. No respiratory distress. He has no wheezes. He exhibits no tenderness.  GI: He exhibits no distension. There is no tenderness.  Musculoskeletal: He exhibits no edema or tenderness.  Neurological: He is alert and oriented to person, place, and time. No cranial nerve deficit. Coordination normal.  Able to state date without difficulty. Able to follow simple motor commands. More alert and appropriate today. UE 5/5 prox to distal. LE: 2+ to 3/5 Hf, 3+ke and 4/5 adf/apf bilaterally. No focal sensory deficits.  Skin: Skin is warm and dry. He is not diaphoretic.  Dry  skin.  Back incision intact without drainage/steristrips.    Psychiatric: He has a normal mood and affect. His behavior is normal.   Assessment/Plan: 1. 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 Physiatrist is providing close team supervision and 24 hour management of active medical problems listed below. Physiatrist and rehab team continue to assess barriers to discharge/monitor patient progress toward functional and medical goals.  Function:  Bathing Bathing position   Position: Wheelchair/chair at sink  Bathing parts Body parts bathed by patient: Right arm, Left arm, Chest, Abdomen, Front perineal area, Buttocks, Right upper  leg, Left upper leg Body parts bathed by helper: Right lower leg, Left lower leg, Back  Bathing assist Assist Level: Touching or steadying assistance(Pt > 75%)      Upper Body Dressing/Undressing Upper body dressing   What is the patient wearing?: Pull  over shirt/dress     Pull over shirt/dress - Perfomed by patient: Thread/unthread right sleeve, Thread/unthread left sleeve, Put head through opening, Pull shirt over trunk          Upper body assist Assist Level: Set up      Lower Body Dressing/Undressing Lower body dressing   What is the patient wearing?: Underwear, Pants, Non-skid slipper socks Underwear - Performed by patient: Pull underwear up/down Underwear - Performed by helper: Thread/unthread right underwear leg, Thread/unthread left underwear leg Pants- Performed by patient: Pull pants up/down Pants- Performed by helper: Thread/unthread right pants leg, Thread/unthread left pants leg   Non-skid slipper socks- Performed by helper: Don/doff right sock, Don/doff left sock                  Lower body assist Assist for lower body dressing: Touching or steadying assistance (Pt > 75%)      Toileting Toileting          Toileting assist     Transfers Chair/bed transfer     Chair/bed transfer assist level: Moderate assist (Pt 50 - 74%/lift or lower) Chair/bed transfer assistive device: Armrests     Locomotion Ambulation     Max distance: 16 Assist level: Touching or steadying assistance (Pt > 75%)   Wheelchair   Type: Manual   Assist Level: Dependent (Pt equals 0%)  Cognition Comprehension Comprehension assist level: Understands complex 90% of the time/cues 10% of the time  Expression Expression assist level: Expresses basic needs/ideas: With no assist  Social Interaction Social Interaction assist level: Interacts appropriately 90% of the time - Needs monitoring or encouragement for participation or interaction.  Problem Solving Problem solving assist level: Solves basic problems with no assist  Memory Memory assist level: Recognizes or recalls 50 - 74% of the time/requires cueing 25 - 49% of the time   Medical Problem List and Plan: 1. 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 DVT Prophylaxis/Anticoagulation: Pharmaceutical: Coumadin. Appreciate pharmacy assist. INR therapeutic 3. Chronic back pain/Pain Management: tylenol only at present 4. Mood: LCSW to follow for evaluation and support.  5. Neuropsych: This patient is not yet capable of making decisions on his own behalf. 6. Skin/Wound Care: Dry dressing daily. Monitor wound for healing. Added protein supplement to help with low protein stores 7. Fluids/Electrolytes/Nutrition: Continue IVF at night to maintain adequate hydration status. All labs personally reviewed today. 8. Acute renal failure: Cr sl elevated. Continue to encourage po honey thick liquids.   -Cr drifting up. Continue check serially  -will increase HS IVF up to 100cc/hr. 9. A fib with RVR: Monitor HR bid. On amiodarone and coumadin --monitor for any symptoms with activity.  10. PMR/RA: Has completed stress dose steroids--now on maintaince dose.  11. Anemia of chronic illness/iron deficiency: On iron supplement. hgb 11.2 12. GERD: protonix 13. DMT2: Currently on Lantus 10 units at bedtime but used glucotrol 5 mg daily at home . Monitor BS ac/hs basis with use SSI for now. Transition to oral hypoglycemic when intake is more consistent. Sugars higher in PM typically  -sugars trending down am cbg 90 today---no changes today 14. CAD: Off ASA/plavix--  now on coumadin and lipitor.  15. Dysphagia: Dysphagia 1, honey liquids by tsp. Needs full supervision at meals. IVF at nights   16. Cough: no signs this morning  -CXR with ?atelectasis left base  -encourage IS, OoB.   LOS (Days) 2 A FACE TO FACE EVALUATION WAS PERFORMED  Gryphon Vanderveen T 08/19/2015 8:12 AM

## 2015-08-19 NOTE — Progress Notes (Signed)
Occupational Therapy Session Note  Patient Details  Name: Curtis Frazier MRN: [id number] Date of Birth: Oct 29, 1940  Today's Date: 08/19/2015 OT Individual Time: 1130-1200 OT Individual Time Calculation (min): 30 min    Short Term Goals: Week 1:  OT Short Term Goal 1 (Week 1): Pt will be able to tolerate standing for at least 2 minutes without changes in vital signs to be more independent with toileting. OT Short Term Goal 2 (Week 1): Pt will complete toileting with steady A. OT Short Term Goal 3 (Week 1): Pt will use AE to don LB clothing with min A. OT Short Term Goal 4 (Week 1): Pt will use long sponge to wash feet. OT Short Term Goal 5 (Week 1): Pt will complete smooth stand to sit with steadying A.  Skilled Therapeutic Interventions/Progress Updates:    Pt seen for skilled OT to facilitate functional mobility skills of sit><stand with a focus on standing tolerance and controlled descent. For 8 reps, Pt was able to push up with BUE through B arm rests and then reach for RW, stood 10-15 seconds and then needed min-mod A with controlled descent into sitting using BUE on armrests.  Pt rested briefly between each stand. Active knee extension with isometric holds while performing ankle ROM to increase quad strength. Chair pushups while counting 321 during controlled descent into sitting. Pt demonstrated the ability to lower smoothly 5/8 reps.  Pt resting in room with son in law with all needs met.  Therapy Documentation Precautions:  Precautions Precautions: Fall, Back Precaution Comments: Recent spine surgery 2/2 Restrictions Weight Bearing Restrictions: No    Vital Signs: Therapy Vitals Pulse Rate: 99 BP: 124/64 mmHg Patient Position (if appropriate): Standing Oxygen Therapy SpO2: 95 % Pain: Pain Assessment Pain Assessment: No/denies pain ADL: ADL ADL Comments: Refer to functional navigator   See Function Navigator for Current Functional Status.   Therapy/Group: Individual  Therapy  Cade 08/19/2015, 12:18 PM

## 2015-08-19 NOTE — Telephone Encounter (Signed)
-----   Message from Ellamae Sia sent at 08/16/2015  3:19 PM EST ----- Regarding: Lab orders for Friday, 3.10.17 Lab orders for a 3 month follow up appt.

## 2015-08-19 NOTE — Progress Notes (Signed)
ANTICOAGULATION CONSULT NOTE - Follow Up Consult  Pharmacy Consult for coumadin Indication: atrial fibrillation  Allergies  Allergen Reactions  . Ace Inhibitors Other (See Comments)    Causes high K  . Angiotensin Receptor Blockers Other (See Comments)    Causes high K  . Metoprolol Other (See Comments)    May cause elevated K level  . Penicillins Swelling and Rash  . Tetracycline Swelling and Rash    Patient Measurements: Weight: 168 lb 6.9 oz (76.4 kg) Heparin Dosing Weight:   Vital Signs: Temp: 98.5 F (36.9 C) (03/09 0504) Temp Source: Oral (03/09 0504) BP: 109/63 mmHg (03/09 0504) Pulse Rate: 92 (03/09 0504)  Labs:  Recent Labs  08/17/15 0500 08/18/15 0516 08/19/15 0448  HGB  --  11.2*  --   HCT  --  34.3*  --   PLT  --  290  --   LABPROT 24.9* 24.4* 32.5*  INR 2.28* 2.22* 3.25*  CREATININE 1.10 1.27* 1.28*    Estimated Creatinine Clearance: 47.5 mL/min (by C-G formula based on Cr of 1.28).   Medications:  Scheduled:  . amiodarone  400 mg Oral Daily  . antiseptic oral rinse  7 mL Mouth Rinse BID  . atorvastatin  20 mg Oral q1800  . feeding supplement (GLUCERNA SHAKE)  237 mL Oral TID WC  . ferrous sulfate  325 mg Oral TID WC  . insulin aspart  0-15 Units Subcutaneous TID WC  . insulin aspart  0-5 Units Subcutaneous QHS  . insulin glargine  10 Units Subcutaneous QHS  . pantoprazole  40 mg Oral Daily  . predniSONE  10 mg Oral Q breakfast  . senna  2 tablet Oral Daily  . sodium chloride 0.9 % 1,000 mL infusion   Intravenous Daily  . sodium chloride flush  10-40 mL Intracatheter Q12H  . thiamine  100 mg Oral Daily  . Warfarin - Pharmacist Dosing Inpatient   Does not apply q1800   Infusions:    Assessment: 75 yo male with afib is currently on supratherapeutic coumadin.  INR went from 2.22 to 3.25.  On amiodarone.  Goal of Therapy:  INR 2-3 Monitor platelets by anticoagulation protocol: Yes   Plan:  1. No coumadin tonight 2. Daily  PT/INR  Dove Gresham, Tsz-Yin 08/19/2015,7:58 AM

## 2015-08-19 NOTE — Progress Notes (Signed)
Occupational Therapy Session Note  Patient Details  Name: Curtis Frazier MRN: [id number] Date of Birth: January 27, 1941  Today's Date: 08/19/2015 OT Individual Time: [id number] Individual Time Calculation (min): 56 min    Short Term Goals: Week 1:  OT Short Term Goal 1 (Week 1): Pt will be able to tolerate standing for at least 2 minutes without changes in vital signs to be more independent with toileting. OT Short Term Goal 2 (Week 1): Pt will complete toileting with steady A. OT Short Term Goal 3 (Week 1): Pt will use AE to don LB clothing with min A. OT Short Term Goal 4 (Week 1): Pt will use long sponge to wash feet. OT Short Term Goal 5 (Week 1): Pt will complete smooth stand to sit with steadying A.  Skilled Therapeutic Interventions/Progress Updates:    Pt resting in bed upon arrival and agreeable to therapy although pt stated he felt weaker today than previous day.  Pt performed supin->sit EOB with supervision in preparation for stand pivot transfer to w/c.  Pt required mod A for transfer to w/c.  Pt declined bathing this morning but did engaged in dressing tasks.  AE (reacher, sock aide, and long handle shoe horn) demonstrated for patient.  Pt utilized reacher to thread pants and sock aid to assist with donning socks.  Pt required assistance with donning shoes. Pt recalled 2/3 back precautions (arching, twisting) and remembered that the other precaution began with the letter B.  Pt stated he had been experiencing word finding difficulties since his surgery in 2012 and also stated that his memory was not as good after this most recent surgery.  Pt performed sit<>stand X 4 during session requiring min A for sit<>stand.  Pt was able to pull up pants while standing with steady A.  Pt noted with SOB with any physical activity and required rest breaks throughout session.  Focus on activity tolerance, sit<>stand, functional transfers, standing balance, discharge planning, review of LTGs, and safety  awareness to increase independence with BADLs.  Therapy Documentation Precautions:  Precautions Precautions: Fall, Back Precaution Comments: Recent spine surgery 2/2 Restrictions Weight Bearing Restrictions: No    Pain:  Pt denied pain  ADL: ADL ADL Comments: Refer to functional navigator   See Function Navigator for Current Functional Status.   Therapy/Group: Individual Therapy  Leroy Libman 08/19/2015, 9:30 AM

## 2015-08-19 NOTE — Progress Notes (Signed)
Physical Therapy Session Note  Patient Details  Name: Curtis Frazier MRN: [id number] Date of Birth: Sep 11, 1940  Today's Date: 08/19/2015 PT Individual Time: [id number] PT Individual Time Calculation (min): 45 min   Short Term Goals: Week 1:  PT Short Term Goal 1 (Week 1): Patient will perform transfers with supervision. PT Short Term Goal 2 (Week 1): Patient will ambulate 50 ft using LRAD with supervision. PT Short Term Goal 3 (Week 1): Patient will negotiate up/down 1 step with 1 rail with min A.  PT Short Term Goal 4 (Week 1): Patient will maintain standing balance x 1 min with min A.   Skilled Therapeutic Interventions/Progress Updates:   Patient in wheelchair upon arrival with son-in-law present for session. Patient instructed in wheelchair propulsion using BUE x 100 ft with supervision and increased time, verbal cues for technique. Patient performed simulated car transfer to sedan height using RW with short distance ambulation from wheelchair with min A for gait and max A for sit <> stand from car due to decreased eccentric control. Performed stand pivot transfers with min A and cues for hand placement. Sit <> stand transfer training from edge of mat without AD with patient able to maintain standing up to 15-20 seconds at a time x 3 trials, verbal cues and manual facilitation for forward weight shift due to posterior lean and min-max A for standing balance. Gait training using RW x 25 ft + 30 ft with min-mod A overall and cues for decreased speed for safety and upright posture, SIL providing wheelchair follow for safety. Patient propelled wheelchair back to room using BUE with increased time and rest breaks as needed with supervision and left sitting in wheelchair with all needs within reach and family/visitors present.   Therapy Documentation Precautions:  Precautions Precautions: Fall, Back Precaution Comments: Recent spine surgery 2/2 Restrictions Weight Bearing Restrictions:  No Pain: Pain Assessment Pain Assessment: No/denies pain   See Function Navigator for Current Functional Status.   Therapy/Group: Individual Therapy  [name] 08/19/2015, 3:30 PM

## 2015-08-19 NOTE — Progress Notes (Signed)
Speech Language Pathology Daily Session Note  Patient Details  Name: Curtis Frazier MRN: [id number] Date of Birth: 1940/07/04  Today's Date: 08/19/2015 SLP Individual Time: 1000-1100 SLP Individual Time Calculation (min): 60 min  Short Term Goals: Week 1: SLP Short Term Goal 1 (Week 1): Patient will demonstrate selective attention to a functional task in a mildly distracting enviornment for 30 minutes with Min A verbal cues for redirection.  SLP Short Term Goal 2 (Week 1): Patient will utilize external memory aids to recall new, daily information with Min A verbal and questioning cues.  SLP Short Term Goal 3 (Week 1): Patient will consume current diet with minimal overt s/s of aspiration with Mod A verbal cues for use of swallowing compensatory strategies.  SLP Short Term Goal 4 (Week 1): Patient will demonstrate efficient mastication with trials of Dys. 2 textures demonstrated by minimal oral residue over 2 consecutive sessions prior to upgrade.  SLP Short Term Goal 5 (Week 1): Patient will consume trials of nectar-thick liquids via tsp with minimal overt s/s of aspiration with Min A verbal cues for use of swallowing comepnsatory strategies over 2 consecutive sesions prior to discharge.  SLP Short Term Goal 6 (Week 1): Patient will perform pharyngeal strengthening exercises with supervision verbal cues for accuracy.   Skilled Therapeutic Interventions: Skilled ST session focused on dysphagia and cognition goals. Pt given trials of ice chips via spoon. Max verbal and tactile cues needed to produce effortful swallow. Education provided on increasing effort of swallow and hyolaryngeal excursion. Pt able to increase hyolaryngeal excursion when putting his hand to throat for increase sensory feedback. Trials of thin water and nectar given via spoon without overt s/s of aspiration. However per MBS, pt with silent penetration. Graham cracker moistened in applesauce given with effective oral phase. Max verbal  and tactile cues given for effortful and multiple swallows. Pt's O2 remained at 94% during consumption. Pharyngeal exercises reviewed with clinician demonstration and pt returned demonstration. Cognitive therapy focused on review of pt's therapy schedule and calendar planning activity. Pt required max verbal A for  Problem solving during activity. Daughter present and education provided on diet, pharyngeal exercises and plan of care. Pt left upright in wheelchair with all needs within reach, daughter present. Continue with current plan of care.   Function:  Eating Eating   Modified Consistency Diet: Yes Eating Assist Level: Set up assist for;Supervision or verbal cues;Helper scoops food on utensil   Eating Set Up Assist For: Opening containers Helper Williams on Utensil: Occasionally     Cognition Comprehension Comprehension assist level: Follows basic conversation/direction with extra time/assistive device  Expression   Expression assist level: Expresses basic 90% of the time/requires cueing < 10% of the time.  Social Interaction Social Interaction assist level: Interacts appropriately with others - No medications needed.;Interacts appropriately 90% of the time - Needs monitoring or encouragement for participation or interaction.  Problem Solving Problem solving assist level: Solves basic 50 - 74% of the time/requires cueing 25 - 49% of the time  Memory Memory assist level: Recognizes or recalls 75 - 89% of the time/requires cueing 10 - 24% of the time    Pain Pain Assessment Pain Assessment: No/denies pain  Therapy/Group: Individual Therapy  Quavis Klutz 08/19/2015, 2:16 PM

## 2015-08-20 ENCOUNTER — Inpatient Hospital Stay (HOSPITAL_COMMUNITY): Payer: Medicare Other | Admitting: Speech Pathology

## 2015-08-20 ENCOUNTER — Inpatient Hospital Stay (HOSPITAL_COMMUNITY): Payer: Medicare Other | Admitting: Occupational Therapy

## 2015-08-20 ENCOUNTER — Other Ambulatory Visit: Payer: Medicare Other

## 2015-08-20 ENCOUNTER — Inpatient Hospital Stay (HOSPITAL_COMMUNITY): Payer: Medicare Other | Admitting: Physical Therapy

## 2015-08-20 DIAGNOSIS — N179 Acute kidney failure, unspecified: Secondary | ICD-10-CM

## 2015-08-20 DIAGNOSIS — R059 Cough, unspecified: Secondary | ICD-10-CM | POA: Insufficient documentation

## 2015-08-20 DIAGNOSIS — R05 Cough: Secondary | ICD-10-CM | POA: Insufficient documentation

## 2015-08-20 LAB — GLUCOSE, CAPILLARY
GLUCOSE-CAPILLARY: 155 mg/dL — AB (ref 65–99)
GLUCOSE-CAPILLARY: 106 mg/dL — AB (ref 65–99)
GLUCOSE-CAPILLARY: 132 mg/dL — AB (ref 65–99)
Glucose-Capillary: 195 mg/dL — ABNORMAL HIGH (ref 65–99)
Glucose-Capillary: 344 mg/dL — ABNORMAL HIGH (ref 65–99)

## 2015-08-20 LAB — PROTIME-INR
INR: 1.49 (ref 0.00–1.49)
INR: 3.59 — ABNORMAL HIGH (ref 0.00–1.49)
PROTHROMBIN TIME: 18 s — AB (ref 11.6–15.2)
PROTHROMBIN TIME: 35.1 s — AB (ref 11.6–15.2)

## 2015-08-20 LAB — BASIC METABOLIC PANEL
ANION GAP: 5 (ref 5–15)
BUN: 17 mg/dL (ref 6–20)
CALCIUM: 7.5 mg/dL — AB (ref 8.9–10.3)
CO2: 24 mmol/L (ref 22–32)
CREATININE: 1.27 mg/dL — AB (ref 0.61–1.24)
Chloride: 109 mmol/L (ref 101–111)
GFR calc non Af Amer: 53 mL/min — ABNORMAL LOW (ref >60)
Glucose, Bld: 108 mg/dL — ABNORMAL HIGH (ref 65–99)
Potassium: 4.1 mmol/L (ref 3.5–5.1)
SODIUM: 138 mmol/L (ref 135–145)

## 2015-08-20 LAB — ALBUMIN: Albumin: 2.1 g/dL — ABNORMAL LOW (ref 3.5–5.0)

## 2015-08-20 MED ORDER — WARFARIN SODIUM 5 MG PO TABS: 5.0000 mg | ORAL_TABLET | Freq: Once | ORAL | Status: DC

## 2015-08-20 MED ORDER — INSULIN ASPART 100 UNIT/ML ~~LOC~~ SOLN
3.0000 [IU] | Freq: Two times a day (BID) | SUBCUTANEOUS | Status: DC
  Administered 2015-08-21 – 2015-08-27 (×12): 3 [IU] via SUBCUTANEOUS

## 2015-08-20 NOTE — Progress Notes (Addendum)
ANTICOAGULATION CONSULT NOTE - Follow Up Consult  Pharmacy Consult for coumadin Indication: atrial fibrillation  Allergies  Allergen Reactions  . Ace Inhibitors Other (See Comments)    Causes high K  . Angiotensin Receptor Blockers Other (See Comments)    Causes high K  . Metoprolol Other (See Comments)    May cause elevated K level  . Penicillins Swelling and Rash  . Tetracycline Swelling and Rash    Patient Measurements: Weight: 168 lb 12.6 oz (76.56 kg) Heparin Dosing Weight:   Vital Signs: Temp: 98.1 F (36.7 C) (03/10 0525) Temp Source: Oral (03/10 0525) BP: 119/65 mmHg (03/10 0525) Pulse Rate: 87 (03/10 0525)  Labs:  Recent Labs  08/18/15 0516 08/19/15 0448 08/20/15 0445  HGB 11.2*  --   --   HCT 34.3*  --   --   PLT 290  --   --   LABPROT 24.4* 32.5* 18.0*  INR 2.22* 3.25* 1.49  CREATININE 1.27* 1.28* 1.27*    Estimated Creatinine Clearance: 47.9 mL/min (by C-G formula based on Cr of 1.27).   Medications:  Scheduled:  . amiodarone  400 mg Oral Daily  . antiseptic oral rinse  7 mL Mouth Rinse BID  . atorvastatin  20 mg Oral q1800  . feeding supplement (GLUCERNA SHAKE)  237 mL Oral TID WC  . ferrous sulfate  325 mg Oral TID WC  . insulin aspart  0-15 Units Subcutaneous TID WC  . insulin aspart  0-5 Units Subcutaneous QHS  . insulin glargine  10 Units Subcutaneous QHS  . pantoprazole  40 mg Oral Daily  . predniSONE  10 mg Oral Q breakfast  . senna  2 tablet Oral Daily  . sodium chloride 0.9 % 1,000 mL infusion   Intravenous Daily  . sodium chloride flush  10-40 mL Intracatheter Q12H  . thiamine  100 mg Oral Daily  . Warfarin - Pharmacist Dosing Inpatient   Does not apply q1800   Infusions:    Assessment: 75 yo male with afib is currently on supratherapeutic coumadin.  INR today is 3.59 (1st level was 1.49 which was erroneous). On amiodarone  Goal of Therapy:  INR 2-3 Monitor platelets by anticoagulation protocol: Yes   Plan:  1. No  coumadin tonight 2. Daily PT/INR  Monte Zinni, Tsz-Yin 08/20/2015,8:08 AM

## 2015-08-20 NOTE — Progress Notes (Signed)
Occupational Therapy Session Note  Patient Details  Name: Curtis Frazier MRN: [id number] Date of Birth: July 28, 1940  Today's Date: 08/20/2015 OT Individual Time: 1400-1500 OT Individual Time Calculation (min): 60 min    Short Term Goals: Week 1:  OT Short Term Goal 1 (Week 1): Pt will be able to tolerate standing for at least 2 minutes without changes in vital signs to be more independent with toileting. OT Short Term Goal 2 (Week 1): Pt will complete toileting with steady A. OT Short Term Goal 3 (Week 1): Pt will use AE to don LB clothing with min A. OT Short Term Goal 4 (Week 1): Pt will use long sponge to wash feet. OT Short Term Goal 5 (Week 1): Pt will complete smooth stand to sit with steadying A.     Skilled Therapeutic Interventions/Progress Updates:    Pt engaged in therapeutic activities in gym using sci fit, ball toss, UE AROM exercises.  Used sci fit for d min at 37 RPM for 3 min, rest break, 3 min at 36 RPM and rest.  Performed shoulder abduction for 40 sec rest for 2 minutes, and then another set 1 minute.  Performed ball toss for 3 minutes, rest.  Pt reported his legs were sore from previous therapy session.  Pt propelled wc to room with min assist and instructional cues for turning.  Prepared diet soda with instructional cues for neck retraction for better alignment when swallowing.  Pt recalled 3/3 back precautions and head position when swallowing at end of session  Therapy Documentation Precautions:  Precautions Precautions: Fall, Back Precaution Comments: Recent spine surgery 2/2 Restrictions Weight Bearing Restrictions: No       Pain: Pain Assessment Pain Assessment: No/denies pain ADL: ADL ADL Comments: Refer to functional navigator    See Function Navigator for Current Functional Status.   Therapy/Group: Individual Therapy  Lisa Roca 08/20/2015, 3:53 PM

## 2015-08-20 NOTE — Progress Notes (Signed)
Occupational Therapy Session Note  Patient Details  Name: Curtis Frazier MRN: [id number] Date of Birth: 12/08/1940  Today's Date: 08/20/2015 OT Individual Time: 1000-1100 OT Individual Time Calculation (min): 60 min    Short Term Goals: Week 1:  OT Short Term Goal 1 (Week 1): Pt will be able to tolerate standing for at least 2 minutes without changes in vital signs to be more independent with toileting. OT Short Term Goal 2 (Week 1): Pt will complete toileting with steady A. OT Short Term Goal 3 (Week 1): Pt will use AE to don LB clothing with min A. OT Short Term Goal 4 (Week 1): Pt will use long sponge to wash feet. OT Short Term Goal 5 (Week 1): Pt will complete smooth stand to sit with steadying A.  Skilled Therapeutic Interventions/Progress Updates:    Pt seen for skilled OT to facilitate activity tolerance, functional mobility, standing balance with ADL retraining using AE.  Pt was able to bathe at sink with min A, don LB clothing with AE and mod cues, and shoes with mod A using shoe horn and elastic shoe laces.  Pt tolerated standing for 20 seconds at a time today. Could not tolerate standing for longer to stand at sink to brush hair.  He is performing sit to stand with close S, but min A to sit smoothly with mod cues to reach back for w/c. Pt in chair with all needs met, daughter in room with pt.  Therapy Documentation Precautions:  Precautions Precautions: Fall, Back Precaution Comments: Recent spine surgery 2/2 Restrictions Weight Bearing Restrictions: No       Pain: Pain Assessment Pain Assessment: No/denies pain ADL: ADL ADL Comments: Refer to functional navigator   See Function Navigator for Current Functional Status.   Therapy/Group: Individual Therapy  , 08/20/2015, 12:24 PM  

## 2015-08-20 NOTE — Discharge Instructions (Addendum)
Inpatient Rehab Discharge Instructions  Curtis Frazier Discharge date and time: 09/02/15   Activities/Precautions/ Functional Status: Activity: no lifting, driving, or strenuous exercise  till cleared by MD.  Diet: cardiac diet and diabetic diet. 4  Gram salt restrictions.   Wound Care: keep wound clean and dry    Functional status:  ___ No restrictions     ___ Walk up steps independently _X__ 24/7 supervision/assistance   ___ Walk up steps with assistance ___ Intermittent supervision/assistance  ___ Bathe/dress independently ___ Walk with walker     _X__ Bathe/dress with assistance ___ Walk Independently    ___ Shower independently ___ Walk with assistance    ___ Shower with assistance _X__ No alcohol     ___ Return to work/school ________  Special Instructions: 1. Nurse to draw protime Monday. Dr. Kyla Balzarine office will call you about coumadin dosing.  2. No bending, twisting or archin. No driving till cleared by MD>  3. Wear support stocking during the day. Keep legs elevated when seated.  4. May resume methotrexate after discharge--can discuss with your rheumatologist.   COMMUNITY REFERRALS UPON DISCHARGE:    Home Health:   PT     OT     ST    RN                        Agency:  [facility] Phone: [phone]    My questions have been answered and I understand these instructions. I will adhere to these goals and the provided educational materials after my discharge from the hospital.  Patient/Caregiver Signature _______________________________ Date __________  Clinician Signature _______________________________________ Date __________  Please bring this form and your medication list with you to all your follow-up doctor's appointments. Medical Equipment/Items Ordered:  Wheelchair and cushion                                                      Agency/Supplier: [facility] @ [phone]          Information on my medicine - Coumadin    (Warfarin)  This medication education was reviewed with me or my healthcare representative as part of my discharge preparation.    Why was Coumadin prescribed for you? Coumadin was prescribed for you because you have a blood clot or a medical condition that can cause an increased risk of forming blood clots. Blood clots can cause serious health problems by blocking the flow of blood to the heart, lung, or brain. Coumadin can prevent harmful blood clots from forming. As a reminder your indication for Coumadin is:   Stroke Prevention Because Of Atrial Fibrillation  What test will check on my response to Coumadin? While on Coumadin (warfarin) you will need to have an INR test regularly to ensure that your dose is keeping you in the desired range. The INR (international normalized ratio) number is calculated from the result of the laboratory test called prothrombin time (PT).  If an INR APPOINTMENT HAS NOT ALREADY BEEN MADE FOR YOU please schedule an appointment to have this lab work done by your health care provider within 7 days. Your INR goal is usually a number between:  2 to 3 or your provider may give you a more narrow range like 2-2.5.  Ask your health care provider during an  office visit what your goal INR is.  What  do you need to  know  About  COUMADIN? Take Coumadin (warfarin) exactly as prescribed by your healthcare provider about the same time each day.  DO NOT stop taking without talking to the doctor who prescribed the medication.  Stopping without other blood clot prevention medication to take the place of Coumadin may increase your risk of developing a new clot or stroke.  Get refills before you run out.  What do you do if you miss a dose? If you miss a dose, take it as soon as you remember on the same day then continue your regularly scheduled regimen the next day.  Do not take two doses of Coumadin at the same time.  Important Safety Information A possible side effect of Coumadin  (Warfarin) is an increased risk of bleeding. You should call your healthcare provider right away if you experience any of the following: ? Bleeding from an injury or your nose that does not stop. ? Unusual colored urine (red or dark brown) or unusual colored stools (red or black). ? Unusual bruising for unknown reasons. ? A serious fall or if you hit your head (even if there is no bleeding).  Some foods or medicines interact with Coumadin (warfarin) and might alter your response to warfarin. To help avoid this: ? Eat a balanced diet, maintaining a consistent amount of Vitamin K. ? Notify your provider about major diet changes you plan to make. ? Avoid alcohol or limit your intake to 1 drink for women and 2 drinks for men per day. (1 drink is 5 oz. wine, 12 oz. beer, or 1.5 oz. liquor.)  Make sure that ANY health care provider who prescribes medication for you knows that you are taking Coumadin (warfarin).  Also make sure the healthcare provider who is monitoring your Coumadin knows when you have started a new medication including herbals and non-prescription products.  Coumadin (Warfarin)  Major Drug Interactions  Increased Warfarin Effect Decreased Warfarin Effect  Alcohol (large quantities) Antibiotics (esp. Septra/Bactrim, Flagyl, Cipro) Amiodarone (Cordarone) Aspirin (ASA) Cimetidine (Tagamet) Megestrol (Megace) NSAIDs (ibuprofen, naproxen, etc.) Piroxicam (Feldene) Propafenone (Rythmol SR) Propranolol (Inderal) Isoniazid (INH) Posaconazole (Noxafil) Barbiturates (Phenobarbital) Carbamazepine (Tegretol) Chlordiazepoxide (Librium) Cholestyramine (Questran) Griseofulvin Oral Contraceptives Rifampin Sucralfate (Carafate) Vitamin K   Coumadin (Warfarin) Major Herbal Interactions  Increased Warfarin Effect Decreased Warfarin Effect  Garlic Ginseng Ginkgo biloba Coenzyme Q10 Green tea St. Johns wort    Coumadin (Warfarin) FOOD Interactions  Eat a consistent number of  servings per week of foods HIGH in Vitamin K (1 serving =  cup)  Collards (cooked, or boiled & drained) Kale (cooked, or boiled & drained) Mustard greens (cooked, or boiled & drained) Parsley *serving size only =  cup Spinach (cooked, or boiled & drained) Swiss chard (cooked, or boiled & drained) Turnip greens (cooked, or boiled & drained)  Eat a consistent number of servings per week of foods MEDIUM-HIGH in Vitamin K (1 serving = 1 cup)  Asparagus (cooked, or boiled & drained) Broccoli (cooked, boiled & drained, or raw & chopped) Brussel sprouts (cooked, or boiled & drained) *serving size only =  cup Lettuce, raw (green leaf, endive, romaine) Spinach, raw Turnip greens, raw & chopped   These websites have more information on Coumadin (warfarin):  FailFactory.se; VeganReport.com.au;

## 2015-08-20 NOTE — Progress Notes (Signed)
Speech Language Pathology Daily Session Note  Patient Details  Name: Curtis Frazier MRN: [id number] Date of Birth: 03/10/1941  Today's Date: 08/20/2015 SLP Individual Time: 0830-0900 SLP Individual Time Calculation (min): 30 min  Short Term Goals: Week 1: SLP Short Term Goal 1 (Week 1): Patient will demonstrate selective attention to a functional task in a mildly distracting enviornment for 30 minutes with Min A verbal cues for redirection.  SLP Short Term Goal 2 (Week 1): Patient will utilize external memory aids to recall new, daily information with Min A verbal and questioning cues.  SLP Short Term Goal 3 (Week 1): Patient will consume current diet with minimal overt s/s of aspiration with Mod A verbal cues for use of swallowing compensatory strategies.  SLP Short Term Goal 4 (Week 1): Patient will demonstrate efficient mastication with trials of Dys. 2 textures demonstrated by minimal oral residue over 2 consecutive sessions prior to upgrade.  SLP Short Term Goal 5 (Week 1): Patient will consume trials of nectar-thick liquids via tsp with minimal overt s/s of aspiration with Min A verbal cues for use of swallowing comepnsatory strategies over 2 consecutive sesions prior to discharge.  SLP Short Term Goal 6 (Week 1): Patient will perform pharyngeal strengthening exercises with supervision verbal cues for accuracy.   Skilled Therapeutic Interventions: Skilled ST session focused on dysphagia and cognitive goals. Pt provided with trials of ice chips (self-feed). Mod verbal cues needed to utilize compensatory strategies during consumption of ice chips (hand to throat, effortful swallow). Pt consumed 7/10 trials without overt s/s of aspiration. Pt coughed with last 3 trials of ice chips.  Pt with cup of water with mouth swab in it. Pt commented that he used it to "wet his mouth." Education provided on individual use of swab and brushing his teeth to help with dryness. Pt voiced understanding. Pt able to  independently review and calculate therapy times and amount of therapy (improvement from previous day). Pt also able to independently recall need for multiple swallows during consumption. Min verbal cues needed for recall of slow rate. Pt left with all needs within reach. Continue current plan of care for possible diet advancement and increase in cognitive ability.   Function:  Eating Eating     Eating Assist Level: Supervision or verbal cues (With trials of ice chips)           Cognition Comprehension Comprehension assist level: Follows basic conversation/direction with extra time/assistive device  Expression   Expression assist level: Expresses basic 90% of the time/requires cueing < 10% of the time.  Social Interaction Social Interaction assist level: Interacts appropriately 90% of the time - Needs monitoring or encouragement for participation or interaction.  Problem Solving Problem solving assist level: Solves basic 50 - 74% of the time/requires cueing 25 - 49% of the time  Memory Memory assist level: Recognizes or recalls 75 - 89% of the time/requires cueing 10 - 24% of the time    Pain Pain Assessment Pain Assessment: No/denies pain  Therapy/Group: Individual Therapy  Curtis Frazier 08/20/2015, 9:26 AM

## 2015-08-20 NOTE — Progress Notes (Signed)
Physical Therapy Session Note  Patient Details  Name: Curtis Frazier MRN: [id number] Date of Birth: 26-Aug-1940  Today's Date: 08/20/2015 PT Individual Time: 1305-1400 PT Individual Time Calculation (min): 55 min   Short Term Goals: Week 1:  PT Short Term Goal 1 (Week 1): Patient will perform transfers with supervision. PT Short Term Goal 2 (Week 1): Patient will ambulate 50 ft using LRAD with supervision. PT Short Term Goal 3 (Week 1): Patient will negotiate up/down 1 step with 1 rail with min A.  PT Short Term Goal 4 (Week 1): Patient will maintain standing balance x 1 min with min A.   Skilled Therapeutic Interventions/Progress Updates:   Patient in wheelchair finishing lunch meal with [name] supervising. Patient donned shoes using Wall shoe horn with assist. Patient propelled wheelchair using BUE x 100 ft with S-min A due to frustration with wheelchair steering in controlled environment with increased time. Gait training using RW on carpet x 25 ft + 10 ft with min A. Patient instructed in FTSS with BUE support and min A to facilitate anterior weight shift = 82 seconds. Stair training up/down 1 (6") step + 2 (6") steps using 2 rails with step-to pattern, ascending forward and descending backward. NuStep using BLE only at level 1 with focus on pacing for a total of 5 minutes with patient requiring rest breaks every 30-60 seconds due to fatigue and SOB. Patient performed stand pivot transfers throughout session with min A.  Patient required frequent prolonged seated rest breaks due to significantly decreased activity tolerance and SOB. Patient left sitting in wheelchair in gym with [name] to await OT session.     Therapy Documentation Precautions:  Precautions Precautions: Fall, Back Precaution Comments: Recent spine surgery 2/2 Restrictions Weight Bearing Restrictions: No Pain: Pain Assessment Pain Assessment: No/denies pain   See Function Navigator for Current Functional  Status.   Therapy/Group: Individual Therapy  [name] 08/20/2015, 2:00 PM

## 2015-08-20 NOTE — Progress Notes (Signed)
[facility]IC[facility] & REHABILITATION     PROGRESS NOTE    Subjective/Complaints: Patient seen sitting up in bed this morning. He slept well overnight states he feels better today.  ROS: Denies CP, SOB, N/V/D.  Objective: Vital Signs: Blood pressure 119/65, pulse 87, temperature 98.1 F (36.7 C), temperature source Oral, resp. rate 20, weight 76.56 kg (168 lb 12.6 oz), SpO2 95 %. No results found.  Recent Labs  08/18/15 0516  WBC 11.2*  HGB 11.2*  HCT 34.3*  PLT 290    Recent Labs  08/19/15 0448 08/20/15 0445  NA 137 138  K 4.0 4.1  CL 103 109  GLUCOSE 88 108*  BUN 17 17  CREATININE 1.28* 1.27*  CALCIUM 8.5* 7.5*   CBG (last 3)   Recent Labs  08/19/15 1634 08/19/15 2124 08/20/15 0623  GLUCAP 218* 155* 106*    Wt Readings from Last 3 Encounters:  08/20/15 76.56 kg (168 lb 12.6 oz)  08/17/15 75.3 kg (166 lb 0.1 oz)  08/03/15 80.1 kg (176 lb 9.4 oz)    Physical Exam:  Constitutional: He appears well-developed and well-nourished. No acute distress. Vital signs reviewed HENT: oral mucosa moist. Normocephalic.  Eyes: Conjunctivae and EOM are normal.  Neck: Normal range of motion. Neck supple.  Cardiovascular: Normal rate. An irregularly irregular rhythm present.  Respiratory: Effort normal and breath sounds normal. No respiratory distress. He has no wheezes. He exhibits no tenderness.  GI: He exhibits no distension. There is no tenderness.  Musculoskeletal: He exhibits no edema or tenderness.  Neurological: He is alert and oriented.  B/l UE 5/5 prox to distal.  B/l LE: 4/5 proximal to distal  Skin: Skin is warm and dry. He is not diaphoretic.  Dry  skin.  Back incision C/D/I Psychiatric: He has a normal mood and affect. His behavior is normal.   Assessment/Plan: 1. 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 Physiatrist is providing close team supervision and 24 hour management of active medical problems listed below. Physiatrist and rehab team continue to assess barriers to discharge/monitor patient progress toward functional and medical goals.  Function:  Bathing Bathing position Bathing activity did not occur: Refused Position: Wheelchair/chair at sink  Bathing parts Body parts bathed by patient: Right arm, Left arm, Chest, Abdomen, Front perineal area, Buttocks, Right upper leg, Left upper leg Body parts bathed by helper: Right lower leg, Left lower leg, Back  Bathing assist Assist Level: Touching or steadying assistance(Pt > 75%)      Upper Body Dressing/Undressing Upper body dressing   What is the patient wearing?: Pull over shirt/dress     Pull over shirt/dress - Perfomed by patient: Thread/unthread right sleeve, Thread/unthread left sleeve, Put head through opening, Pull shirt over trunk          Upper body assist Assist Level: Set up      Lower Body Dressing/Undressing Lower body dressing   What is the patient wearing?: Underwear, Pants, Socks, Shoes Underwear - Performed by patient: Pull underwear up/down Underwear - Performed by helper: Thread/unthread right underwear leg, Thread/unthread left underwear leg Pants- Performed by patient: Thread/unthread left pants leg, Pull pants up/down Pants- Performed by helper: Thread/unthread right pants leg   Non-skid slipper socks- Performed by helper: Don/doff right sock, Don/doff left sock Socks - Performed by patient: Don/doff right sock, Don/doff left sock     Shoes - Performed  by helper: Don/doff right shoe, Don/doff left shoe, Fasten right, Fasten left          Lower body assist Assist for lower body dressing: Touching or steadying assistance (Pt > 75%)      Toileting Toileting          Toileting assist     Transfers Chair/bed transfer   Chair/bed transfer method: Ambulatory Chair/bed transfer assist  level: Moderate assist (Pt 50 - 74%/lift or lower) Chair/bed transfer assistive device: Armrests, Medical sales representative     Max distance: 30 ft Assist level: Moderate assist (Pt 50 - 74%)   Wheelchair   Type: Manual Max wheelchair distance: 100 ft Assist Level: Supervision or verbal cues  Cognition Comprehension Comprehension assist level: Follows basic conversation/direction with extra time/assistive device  Expression Expression assist level: Expresses basic 90% of the time/requires cueing < 10% of the time.  Social Interaction Social Interaction assist level: Interacts appropriately 90% of the time - Needs monitoring or encouragement for participation or interaction.  Problem Solving Problem solving assist level: Solves basic 50 - 74% of the time/requires cueing 25 - 49% of the time  Memory Memory assist level: Recognizes or recalls 75 - 89% of the time/requires cueing 10 - 24% of the time   Medical Problem List and Plan: 1. 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 DVT Prophylaxis/Anticoagulation: Pharmaceutical: Coumadin. Appreciate pharmacy assist.   INR is therapeutic and then supratherapeutic within 4 hours, will follow up 3. Chronic back pain/Pain Management: tylenol only at present 4. Mood: LCSW to follow for evaluation and support.  5. Neuropsych: This patient is not yet capable of making decisions on his own behalf. 6. Skin/Wound Care: Dry dressing daily. Monitor wound for healing. Added protein supplement to help with low protein stores 7. Fluids/Electrolytes/Nutrition: Continue IVF at night to maintain adequate hydration status. All labs personally reviewed today. 8. Acute renal failure: Cr sl elevated. Continue to encourage po honey thick liquids.   -Cr 1.27 on 3/10. Continue check serially  -Increased QHS IVF to 100cc/hr. 9. A fib with RVR:  Monitor HR bid. On amiodarone and coumadin --monitor for any symptoms with activity.  10. PMR/RA: Has completed stress dose steroids--now on maintaince dose.  11. Anemia of chronic illness/iron deficiency: On iron supplement. hgb 11.2 12. GERD: protonix 13. DMT2: Currently on Lantus 10 units at bedtime but used glucotrol 5 mg daily at home .   Monitor BS ac/hs with use SSI for now.   Transition to oral hypoglycemic when intake is more consistent.   CBG 106 this AM 14. CAD: Off ASA/plavix-- now on coumadin and lipitor.  15. Dysphagia: Dysphagia 1, honey liquids by tsp. Needs full supervision at meals. IVF at nights   16. Cough: no signs this morning  -CXR with ?atelectasis left base  -encourage IS, OoB.   LOS (Days) 3 A FACE TO FACE EVALUATION WAS PERFORMED  Manasi Dishon Lorie Phenix 08/20/2015 9:54 AM

## 2015-08-21 ENCOUNTER — Inpatient Hospital Stay (HOSPITAL_COMMUNITY): Payer: Medicare Other | Admitting: Speech Pathology

## 2015-08-21 ENCOUNTER — Inpatient Hospital Stay (HOSPITAL_COMMUNITY): Payer: Medicare Other

## 2015-08-21 DIAGNOSIS — R7309 Other abnormal glucose: Secondary | ICD-10-CM

## 2015-08-21 DIAGNOSIS — R791 Abnormal coagulation profile: Secondary | ICD-10-CM

## 2015-08-21 LAB — GLUCOSE, CAPILLARY
GLUCOSE-CAPILLARY: 168 mg/dL — AB (ref 65–99)
Glucose-Capillary: 98 mg/dL (ref 65–99)
Glucose-Capillary: 172 mg/dL — ABNORMAL HIGH (ref 65–99)
Glucose-Capillary: 262 mg/dL — ABNORMAL HIGH (ref 65–99)

## 2015-08-21 LAB — PROTIME-INR
INR: 3.23 — ABNORMAL HIGH (ref 0.00–1.49)
Prothrombin Time: 32.3 seconds — ABNORMAL HIGH (ref 11.6–15.2)

## 2015-08-21 NOTE — Progress Notes (Signed)
Speech Language Pathology Daily Session Note  Patient Details  Name: Curtis Frazier MRN: [id number] Date of Birth: [birth year]  Today's Date: 08/21/2015 SLP Individual Time: 1030-1120 SLP Individual Time Calculation (min): 50 min  Short Term Goals: Week 1: SLP Short Term Goal 1 (Week 1): Patient will demonstrate selective attention to a functional task in a mildly distracting enviornment for 30 minutes with Min A verbal cues for redirection.  SLP Short Term Goal 2 (Week 1): Patient will utilize external memory aids to recall new, daily information with Min A verbal and questioning cues.  SLP Short Term Goal 3 (Week 1): Patient will consume current diet with minimal overt s/s of aspiration with Mod A verbal cues for use of swallowing compensatory strategies.  SLP Short Term Goal 4 (Week 1): Patient will demonstrate efficient mastication with trials of Dys. 2 textures demonstrated by minimal oral residue over 2 consecutive sessions prior to upgrade.  SLP Short Term Goal 5 (Week 1): Patient will consume trials of nectar-thick liquids via tsp with minimal overt s/s of aspiration with Min A verbal cues for use of swallowing comepnsatory strategies over 2 consecutive sesions prior to discharge.  SLP Short Term Goal 6 (Week 1): Patient will perform pharyngeal strengthening exercises with supervision verbal cues for accuracy.   Skilled Therapeutic Interventions: Skilled treatment session focused on addressing dysphagia goals. SLP facilitated session by providing set-up of oral care via suctioning and Min verbal and visual cues for thoroughness. Patient self fed ice chips and performed hard effortful swallows x20 and Masako Maneuver x20 again with Min verbal cues for accuracy.  Patient tolerated session well with cough response x2; as a result treatment plan and orders modified to allow ice chips after oral care with full RN Supervision.  Patient also asked appropriate questions regarding prognosis and SLP  answered his questions.  Patient likely ready for repeat objective assessment/upgrade next week.    Function:  Eating Eating   Modified Consistency Diet: Yes Eating Assist Level: Supervision or verbal cues           Cognition Comprehension Comprehension assist level: Follows basic conversation/direction with no assist  Expression   Expression assist level: Expresses basic needs/ideas: With extra time/assistive device  Social Interaction Social Interaction assist level: Interacts appropriately with others with medication or extra time (anti-anxiety, antidepressant).  Problem Solving Problem solving assist level: Solves basic 90% of the time/requires cueing < 10% of the time  Memory Memory assist level: Recognizes or recalls 75 - 89% of the time/requires cueing 10 - 24% of the time    Pain Pain Assessment Pain Assessment: No/denies pain  Therapy/Group: Individual Therapy  Carmelia Roller., [place] [id number]  [place] 08/21/2015, 8:03 PM

## 2015-08-21 NOTE — Progress Notes (Signed)
[place] PHYSICAL MEDICINE & REHABILITATION     PROGRESS NOTE  Subjective/Complaints: Patient seen sleeping in bed this morning. He had a good day yesterday and is enjoying therapies.  ROS: Denies CP, SOB, N/V/D.  Objective: Vital Signs: Blood pressure 129/65, pulse 86, temperature 97.8 F (36.6 C), temperature source Oral, resp. rate 18, weight 76.1 kg (167 lb 12.3 oz), SpO2 94 %. No results found. No results for input(s): WBC, HGB, HCT, PLT in the last 72 hours.  Recent Labs  08/19/15 0448 08/20/15 0445  NA 137 138  K 4.0 4.1  CL 103 109  GLUCOSE 88 108*  BUN 17 17  CREATININE 1.28* 1.27*  CALCIUM 8.5* 7.5*   CBG (last 3)   Recent Labs  08/20/15 1639 08/20/15 2100 08/21/15 0652  GLUCAP 344* 132* 98    Wt Readings from Last 3 Encounters:  08/21/15 76.1 kg (167 lb 12.3 oz)  08/17/15 75.3 kg (166 lb 0.1 oz)  08/03/15 80.1 kg (176 lb 9.4 oz)    Physical Exam:  Constitutional: He appears well-developed and well-nourished. No acute distress. Vital signs reviewed HENT: Normocephalic. Atraumatic Eyes: Conjunctivae and EOM are normal.  Neck: Normal range of motion. Neck supple.  Cardiovascular: Normal rate. An irregularly irregular rhythm present.  Respiratory: Effort normal and breath sounds normal. No respiratory distress. He has no wheezes. He exhibits no tenderness.  GI: He exhibits no distension. There is no tenderness.  Musculoskeletal: He exhibits no edema or tenderness.  Neurological: He is alert and oriented.  B/l UE 5/5 prox to distal.  B/l LE: 4/5 proximal to distal  Skin: Skin is warm and dry. He is not diaphoretic.  Dry  skin.  Back incision C/D/I Psychiatric: He has a normal mood and affect. His behavior is normal.   Assessment/Plan: 1. 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 Physiatrist is providing  close team supervision and 24 hour management of active medical problems listed below. Physiatrist and rehab team continue to assess barriers to discharge/monitor patient progress toward functional and medical goals.  Function:  Bathing Bathing position Bathing activity did not occur: Refused Position: Wheelchair/chair at sink  Bathing parts Body parts bathed by patient: Right arm, Left arm, Chest, Abdomen, Front perineal area, Buttocks, Right upper leg, Left upper leg, Right lower leg, Left lower leg Body parts bathed by helper: Back  Bathing assist Assist Level: Touching or steadying assistance(Pt > 75%)      Upper Body Dressing/Undressing Upper body dressing   What is the patient wearing?: Pull over shirt/dress     Pull over shirt/dress - Perfomed by patient: Thread/unthread right sleeve, Thread/unthread left sleeve, Put head through opening, Pull shirt over trunk          Upper body assist Assist Level: Set up   Set up : To obtain clothing/put away  Lower Body Dressing/Undressing Lower body dressing   What is the patient wearing?: Underwear, Pants, Socks, Shoes Underwear - Performed by patient: Thread/unthread right underwear leg, Pull underwear up/down, Thread/unthread left underwear leg Underwear - Performed by helper: Thread/unthread right underwear leg, Thread/unthread left underwear leg Pants- Performed by patient: Thread/unthread left pants leg, Pull pants up/down, Thread/unthread right pants leg Pants- Performed by helper: Thread/unthread right pants leg   Non-skid slipper socks- Performed by helper: Don/doff right sock, Don/doff left sock Socks - Performed by patient: Don/doff right sock, Don/doff left sock  Shoes - Performed by helper: Don/doff right shoe, Don/doff left shoe          Lower body assist Assist for lower body dressing: Touching or steadying assistance (Pt > 75%)      Toileting Toileting     Toileting steps completed by helper: Adjust  clothing prior to toileting, Performs perineal hygiene, Adjust clothing after toileting Toileting Assistive Devices: Grab bar or rail  Toileting assist Assist level: Touching or steadying assistance (Pt.75%), Two helpers   Transfers Chair/bed transfer   Chair/bed transfer method: Stand pivot Chair/bed transfer assist level: Touching or steadying assistance (Pt > 75%) Chair/bed transfer assistive device: Armrests     Locomotion Ambulation     Max distance: 25 ft Assist level: Touching or steadying assistance (Pt > 75%)   Wheelchair   Type: Manual Max wheelchair distance: 100 ft Assist Level: Touching or steadying assistance (Pt > 75%)  Cognition Comprehension Comprehension assist level: Follows basic conversation/direction with extra time/assistive device  Expression Expression assist level: Expresses basic 90% of the time/requires cueing < 10% of the time.  Social Interaction Social Interaction assist level: Interacts appropriately with others with medication or extra time (anti-anxiety, antidepressant).  Problem Solving Problem solving assist level: Solves basic 50 - 74% of the time/requires cueing 25 - 49% of the time  Memory Memory assist level: Recognizes or recalls 75 - 89% of the time/requires cueing 10 - 24% of the time   Medical Problem List and Plan: 1. 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 DVT Prophylaxis/Anticoagulation: Pharmaceutical: Coumadin. Appreciate pharmacy assist.   INR is supratherapeutic. Appreciate pharmacy assistance 3. Chronic back pain/Pain Management: tylenol only at present 4. Mood: LCSW to follow for evaluation and support.  5. Neuropsych: This patient is not yet capable of making decisions on his own behalf. 6. Skin/Wound Care: Dry dressing daily. Monitor wound for healing. Added protein supplement to help with low protein stores 7.  Fluids/Electrolytes/Nutrition: Continue IVF at night to maintain adequate hydration status. All labs personally reviewed today. 8. Acute renal failure: Cr sl elevated. Continue to encourage po honey thick liquids.   -Cr 1.27 on 3/10. Continue check serially  -Increased QHS IVF to 100cc/hr. 9. A fib with RVR: Monitor HR bid. On amiodarone and coumadin --monitor for any symptoms with activity.  10. PMR/RA: Has completed stress dose steroids--now on maintaince dose.  11. Anemia of chronic illness/iron deficiency: On iron supplement. hgb 11.2 12. GERD: protonix 13. DMT2: Currently on Lantus 10 units at bedtime but used glucotrol 5 mg daily at home .   Monitor BS ac/hs with use SSI for now.   Transition to oral hypoglycemic when intake is more consistent.   Fasting CBG 98 this AM, however 1 elevated reading yesterday, will continue to monitor. Dietary intake still not consistent. 14. CAD: Off ASA/plavix-- now on coumadin and lipitor.  15. Dysphagia: Dysphagia 1, honey liquids by tsp. Needs full supervision at meals. IVF at nights   16. Cough: no signs this morning  -CXR with ?atelectasis left base  -encourage IS, OoB.   LOS (Days) 4 A FACE TO FACE EVALUATION WAS PERFORMED  Ankit Lorie Phenix 08/21/2015 8:15 AM

## 2015-08-21 NOTE — Progress Notes (Signed)
Occupational Therapy Session Note  Patient Details  Name: Curtis Frazier MRN: [id number] Date of Birth: [birth year]  Today's Date: 08/21/2015 OT Individual Time: 0700-0800 OT Individual Time Calculation (min): 60 min    Short Term Goals: Week 1:  OT Short Term Goal 1 (Week 1): Pt will be able to tolerate standing for at least 2 minutes without changes in vital signs to be more independent with toileting. OT Short Term Goal 2 (Week 1): Pt will complete toileting with steady A. OT Short Term Goal 3 (Week 1): Pt will use AE to don LB clothing with min A. OT Short Term Goal 4 (Week 1): Pt will use long sponge to wash feet. OT Short Term Goal 5 (Week 1): Pt will complete smooth stand to sit with steadying A.  Skilled Therapeutic Interventions/Progress Updates:   OT session focused on ADL retraining, standing balance, activity tolerance, and use of AE. Pt received supine in bet, completing supine>sit with supervision and use of log roll technique. Pt completed bathing and dressing sit<>stand at sink with increased time due to frequent rest breaks. Pt required min A for sit<>stand on 4 occasion, standing for a max of 30 seconds. OT provided long handled sponge for washing lower BLEs with pt carrying over with no cues. Pt utilized reacher, sock-aid, and shoe horn for LB dressing, requiring mod cues for correct use with sock-aid and shoe horn. Pt completed self-feeding today, opening containers with increased time and no physical assist. OT provided min cues for multiple swallows and slowing pace during breakfast. At end of session pt left sitting in w/c with all needs in reach.  Therapy Documentation Precautions:  Precautions Precautions: Fall, Back Precaution Comments: Recent spine surgery 2/2 Restrictions Weight Bearing Restrictions: No General:   Vital Signs: Therapy Vitals Temp: 97.8 F (36.6 C) Temp Source: Oral Pulse Rate: 86 Resp: 18 BP: 129/65 mmHg Patient Position (if appropriate):  Lying Oxygen Therapy SpO2: 94 % O2 Device: Not Delivered Pain: No report of pain  Other Treatments:    See Function Navigator for Current Functional Status.   Therapy/Group: Individual Therapy  Duayne Cal 08/21/2015, 7:55 AM

## 2015-08-21 NOTE — Progress Notes (Signed)
ANTICOAGULATION CONSULT NOTE - Follow Up Consult  Pharmacy Consult for coumadin Indication: atrial fibrillation  Allergies  Allergen Reactions  . Ace Inhibitors Other (See Comments)    Causes high K  . Angiotensin Receptor Blockers Other (See Comments)    Causes high K  . Metoprolol Other (See Comments)    May cause elevated K level  . Penicillins Swelling and Rash  . Tetracycline Swelling and Rash    Patient Measurements: Weight: 167 lb 12.3 oz (76.1 kg) Heparin Dosing Weight:   Vital Signs: Temp: 97.8 F (36.6 C) (03/11 0504) Temp Source: Oral (03/11 0504) BP: 129/65 mmHg (03/11 0504) Pulse Rate: 86 (03/11 0504)  Labs:  Recent Labs  08/19/15 0448 08/20/15 0445 08/20/15 0839 08/21/15 0440  LABPROT 32.5* 18.0* 35.1* 32.3*  INR 3.25* 1.49 3.59* 3.23*  CREATININE 1.28* 1.27*  --   --     Estimated Creatinine Clearance: 47.9 mL/min (by C-G formula based on Cr of 1.27).   Medications:  Scheduled:  . amiodarone  400 mg Oral Daily  . antiseptic oral rinse  7 mL Mouth Rinse BID  . atorvastatin  20 mg Oral q1800  . feeding supplement (GLUCERNA SHAKE)  237 mL Oral TID WC  . ferrous sulfate  325 mg Oral TID WC  . insulin aspart  0-15 Units Subcutaneous TID WC  . insulin aspart  0-5 Units Subcutaneous QHS  . insulin aspart  3 Units Subcutaneous BID WC  . insulin glargine  10 Units Subcutaneous QHS  . pantoprazole  40 mg Oral Daily  . predniSONE  10 mg Oral Q breakfast  . senna  2 tablet Oral Daily  . sodium chloride 0.9 % 1,000 mL infusion   Intravenous Daily  . sodium chloride flush  10-40 mL Intracatheter Q12H  . thiamine  100 mg Oral Daily  . Warfarin - Pharmacist Dosing Inpatient   Does not apply q1800   Infusions:    Assessment: 75 yo male with afib on warfarin.    INR continues to be supratherapeutic today at 3.23 and trending down appropriately. Currently on amiodarone. Warfarin held since 3/9. Will continue to hold. No noted bleeding.  Goal of  Therapy:  INR 2-3 Monitor platelets by anticoagulation protocol: Yes   Plan:  1. Hold warfarin tonight 2. Daily PT/INR 3. Monitor s/sx bleeding    Heloise Ochoa, Pharm.D., BCPS PGY2 Cardiology Pharmacy Resident Pager: [phone]  08/21/2015,1:05 PM

## 2015-08-22 ENCOUNTER — Inpatient Hospital Stay (HOSPITAL_COMMUNITY): Payer: Medicare Other | Admitting: Physical Therapy

## 2015-08-22 ENCOUNTER — Inpatient Hospital Stay (HOSPITAL_COMMUNITY): Payer: Medicare Other | Admitting: Occupational Therapy

## 2015-08-22 LAB — GLUCOSE, CAPILLARY
GLUCOSE-CAPILLARY: 82 mg/dL (ref 65–99)
GLUCOSE-CAPILLARY: 272 mg/dL — AB (ref 65–99)
GLUCOSE-CAPILLARY: 142 mg/dL — AB (ref 65–99)
Glucose-Capillary: 158 mg/dL — ABNORMAL HIGH (ref 65–99)

## 2015-08-22 LAB — PROTIME-INR
INR: 2.49 — ABNORMAL HIGH (ref 0.00–1.49)
Prothrombin Time: 26.6 seconds — ABNORMAL HIGH (ref 11.6–15.2)

## 2015-08-22 LAB — OCCULT BLOOD X 1 CARD TO LAB, STOOL: Fecal Occult Bld: NEGATIVE

## 2015-08-22 MED ORDER — WARFARIN SODIUM 3 MG PO TABS
3.0000 mg | ORAL_TABLET | Freq: Once | ORAL | Status: AC
  Administered 2015-08-22: 3 mg via ORAL
  Filled 2015-08-22: qty 1

## 2015-08-22 NOTE — Progress Notes (Signed)
Physical Therapy Session Note  Patient Details  Name: Curtis Frazier MRN: [id number] Date of Birth: 11/21/1940  Today's Date: 08/22/2015 PT Individual Time: [id number] PT Individual Time Calculation (min): 75 min   Short Term Goals: Week 1:  PT Short Term Goal 1 (Week 1): Patient will perform transfers with supervision. PT Short Term Goal 2 (Week 1): Patient will ambulate 50 ft using LRAD with supervision. PT Short Term Goal 3 (Week 1): Patient will negotiate up/down 1 step with 1 rail with min A.  PT Short Term Goal 4 (Week 1): Patient will maintain standing balance x 1 min with min A.   Skilled Therapeutic Interventions/Progress Updates:   Patient in wheelchair with family present for session. Daughter with continued questions about swelling in BLE, RN aware and awaiting orders for TED hose and PT provided education regarding elevating BLE in chair and bed, both verbalized understanding. Patient propelled wheelchair using BUE to gym with increased time and rest breaks as needed with supervision. Patient frustrated with wheelchair listing to one direction, switched out wheelchair for improved propulsion and adjusted elevating leg rests for patient comfort. Patient performed stand pivot transfers with and without RW with min A and short distance ambulation using RW with close supervision. Patient continues to require max cues for hand placement and decreased eccentric control with stand > sit. Patient performed NuStep using BUE/BLE at level 1 for total of 10 minutes with rest breaks as needed. Patient demonstrates high fall risk as noted by score of 15/56 on Berg Balance Scale. Gait training using RW x 65 ft + 40 ft with supervision-min guard in controlled environment. Patient required prolonged rest breaks with all mobility due to shortness of breath, increased HR, and decreased activity tolerance. Patient left sitting in wheelchair with ELR's and needs within reach, family present.   Therapy  Documentation Precautions:  Precautions Precautions: Fall, Back Precaution Comments: Recent spine surgery 2/2 Restrictions Weight Bearing Restrictions: No Pain: Pain Assessment Pain Assessment: No/denies pain   See Function Navigator for Current Functional Status.   Therapy/Group: Individual Therapy  [name] 08/22/2015, 2:12 PM

## 2015-08-22 NOTE — Progress Notes (Addendum)
ANTICOAGULATION CONSULT NOTE - Follow Up Consult  Pharmacy Consult for coumadin Indication: atrial fibrillation  Allergies  Allergen Reactions  . Ace Inhibitors Other (See Comments)    Causes high K  . Angiotensin Receptor Blockers Other (See Comments)    Causes high K  . Metoprolol Other (See Comments)    May cause elevated K level  . Penicillins Swelling and Rash  . Tetracycline Swelling and Rash    Patient Measurements: Weight: 167 lb 12.3 oz (76.1 kg) Heparin Dosing Weight:   Vital Signs: Temp: 98 F (36.7 C) (03/12 0500) Temp Source: Oral (03/12 0500) BP: 115/55 mmHg (03/12 1019) Pulse Rate: 147 (03/12 1030)  Labs:  Recent Labs  08/20/15 0445 08/20/15 0839 08/21/15 0440 08/22/15 0713  LABPROT 18.0* 35.1* 32.3* 26.6*  INR 1.49 3.59* 3.23* 2.49*  CREATININE 1.27*  --   --   --     Estimated Creatinine Clearance: 47.9 mL/min (by C-G formula based on Cr of 1.27).   Medications:  Scheduled:  . amiodarone  400 mg Oral Daily  . antiseptic oral rinse  7 mL Mouth Rinse BID  . atorvastatin  20 mg Oral q1800  . feeding supplement (GLUCERNA SHAKE)  237 mL Oral TID WC  . ferrous sulfate  325 mg Oral TID WC  . insulin aspart  0-15 Units Subcutaneous TID WC  . insulin aspart  0-5 Units Subcutaneous QHS  . insulin aspart  3 Units Subcutaneous BID WC  . insulin glargine  10 Units Subcutaneous QHS  . pantoprazole  40 mg Oral Daily  . predniSONE  10 mg Oral Q breakfast  . senna  2 tablet Oral Daily  . sodium chloride 0.9 % 1,000 mL infusion   Intravenous Daily  . sodium chloride flush  10-40 mL Intracatheter Q12H  . thiamine  100 mg Oral Daily  . Warfarin - Pharmacist Dosing Inpatient   Does not apply q1800   Infusions:    Assessment: 75 yo male with afib on warfarin.    INR therapeutic today at 2.49 after holding warfarin x 3 days for supratherapeutic INR. Currently on amiodarone and will restart at lower dose. No noted bleeding.  PTA dose 5 mg qday  Goal of  Therapy:  INR 2-3 Monitor platelets by anticoagulation protocol: Yes   Plan:  1. Warfarin 3 mg x 1 tonight 2. Daily PT/INR 3. Monitor s/sx bleeding    Heloise Ochoa, Pharm.D., BCPS PGY2 Cardiology Pharmacy Resident Pager: [phone]  08/22/2015,1:14 PM

## 2015-08-22 NOTE — Progress Notes (Signed)
Occupational Therapy Session Note  Patient Details  Name: Curtis Frazier MRN: [id number] Date of Birth: November 13, 1940  Today's Date: 08/22/2015 OT Individual Time: 1005-1105 OT Individual Time Calculation (min): 60 min    Short Term Goals: Week 1:  OT Short Term Goal 1 (Week 1): Pt will be able to tolerate standing for at least 2 minutes without changes in vital signs to be more independent with toileting. OT Short Term Goal 2 (Week 1): Pt will complete toileting with steady A. OT Short Term Goal 3 (Week 1): Pt will use AE to don LB clothing with min A. OT Short Term Goal 4 (Week 1): Pt will use long sponge to wash feet. OT Short Term Goal 5 (Week 1): Pt will complete smooth stand to sit with steadying A.  Skilled Therapeutic Interventions/Progress Updates:    1:1 Self care retraining at sink level.  Pt with c/o of fatigue today and feeling more tired today than yesterday.  Pt's HR monitored during session and pt with elevated HR with minimal activity and presenting with 2/4 dyspnea. Focus on sit to stands and standing balance.  Pt able to perform all sit to stands throughout session with supervision but required steady A to maintain standing balance due to leaning posteriorly. Pt demonstrated recall of back precautions and practical use of AE during LB bathing and dressing.   Pt requested ice chips at end of session after oral care.  Pt consumed approx. 10-12 chips and presented with a cough 3x.   Therapy Documentation Precautions:  Precautions Precautions: Fall, Back Precaution Comments: Recent spine surgery 2/2 Restrictions Weight Bearing Restrictions: No    Vital Signs: Therapy Vitals Pulse Rate: (!) 147 (with activity ) BP: (!) 115/55 mmHg Patient Position (if appropriate): Lying Oxygen Therapy SpO2: 92 % O2 Device: Not Delivered Pulse Oximetry Type: Intermittent Pain: Pain Assessment Pain Assessment: No/denies pain ADL: ADL ADL Comments: Refer to functional navigator    See Function Navigator for Current Functional Status.   Therapy/Group: Individual Therapy  Willeen [facility] 08/22/2015, 11:41 AM

## 2015-08-22 NOTE — Progress Notes (Signed)
Physical Therapy Session Note  Patient Details  Name: Curtis Frazier MRN: [id number] Date of Birth: 11-20-1940  Today's Date: 08/22/2015 PT Individual Time: 0901-1000 PT Individual Time Calculation (min): 59 min   Short Term Goals: Week 1:  PT Short Term Goal 1 (Week 1): Patient will perform transfers with supervision. PT Short Term Goal 2 (Week 1): Patient will ambulate 50 ft using LRAD with supervision. PT Short Term Goal 3 (Week 1): Patient will negotiate up/down 1 step with 1 rail with min A.  PT Short Term Goal 4 (Week 1): Patient will maintain standing balance x 1 min with min A.   Skilled Therapeutic Interventions/Progress Updates:    Pt received in bed & agreeable to PT. Pt able to recall 3/3 back precautions independently. Pt transferred out of bed with supervision, PT provided assistance with threading pants on BLE, pt transferred to standing & independently finished donning pants with Min A from therapist for standing balance 2/2 posterior lean. Pt transferred bed>w/c with RW & CGA, requiring maximum cuing for hand placement for transfers; pt with poor carry over of hand placement throughout session & poor eccentric control with stand>sit. Pt able to propel w/c 80 ft with BUE & supervision before requiring rest break. Pt ambulated 40 ft + 55 ft + 20 ft with significant sitting rest breaks between each trial. Pt noted shortness of breath during and after ambulation; PT educated pt on pursed lip breathing, and SpO2 >90% throughout session. Pt required cuing to for anterior weight shift and forward trunk flexion to assist with Sit>stand as pt experienced LOB posteriorly with assist from PT to recover & complete transfers. PT also educated pt on importance of ambulating in base of RW to prevent flexing spine. Pt able to tolerate standing for 35 seconds before requiring seated rest break. At end of session PT transported back to room & left in w/c with all needs met & call bell within reach.    Therapy Documentation Precautions:  Precautions Precautions: Fall, Back Precaution Comments: Recent spine surgery 2/2 Restrictions Weight Bearing Restrictions: No   Vital Signs: Therapy Vitals Pulse Rate: (!) 107 (after ambulating) BP: (!) 115/55 mmHg Patient Position (if appropriate): Lying Oxygen Therapy SpO2: 96 % O2 Device: Not Delivered Pulse Oximetry Type: Intermittent Pain: Pain Assessment Pain Assessment: No/denies pain  See Function Navigator for Current Functional Status.   Therapy/Group: Individual Therapy  Waunita Schooner 08/22/2015, 10:20 AM

## 2015-08-22 NOTE — Progress Notes (Signed)
PHYSICAL MEDICINE & REHABILITATION     PROGRESS NOTE  Subjective/Complaints: Pt seen laying in bed this AM.  He is thirsty this morning and sates that he feels well rested from yesterday.  ROS: Denies CP, SOB, N/V/D.  Objective: Vital Signs: Blood pressure 115/55, pulse 147, temperature 98 F (36.7 C), temperature source Oral, resp. rate 18, weight 76.1 kg (167 lb 12.3 oz), SpO2 92 %. No results found. No results for input(s): WBC, HGB, HCT, PLT in the last 72 hours.  Recent Labs  08/20/15 0445  NA 138  K 4.1  CL 109  GLUCOSE 108*  BUN 17  CREATININE 1.27*  CALCIUM 7.5*   CBG (last 3)   Recent Labs  08/21/15 2044 08/22/15 0724 08/22/15 1131  GLUCAP 262* 82 158*    Wt Readings from Last 3 Encounters:  08/21/15 76.1 kg (167 lb 12.3 oz)  08/17/15 75.3 kg (166 lb 0.1 oz)  08/03/15 80.1 kg (176 lb 9.4 oz)    Physical Exam:  Constitutional: He appears well-developed and well-nourished. No acute distress. Vital signs reviewed HENT: Normocephalic. Atraumatic Eyes: Conjunctivae and EOM are normal.  Neck: Normal range of motion. Neck supple.  Cardiovascular: Normal rate. An irregularly irregular rhythm present.  Respiratory: Effort normal and breath sounds normal. No respiratory distress. He has no wheezes. He exhibits no tenderness.  GI: He exhibits no distension. There is no tenderness.  Musculoskeletal: He exhibits no edema or tenderness.  Neurological: He is alert and oriented.  B/l UE 5/5 prox to distal.  B/l LE: 4/5 hip flexion, knee extension 5/5 ankle dorsi/plantarflexion Skin: Skin is warm and dry. He is not diaphoretic.  Dry  skin.  Back incision C/D/I Psychiatric: He has a normal mood and affect. His behavior is normal.   Assessment/Plan: 1. 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 Physiatrist is  providing close team supervision and 24 hour management of active medical problems listed below. Physiatrist and rehab team continue to assess barriers to discharge/monitor patient progress toward functional and medical goals.  Function:  Bathing Bathing position Bathing activity did not occur: Refused Position: Wheelchair/chair at sink  Bathing parts Body parts bathed by patient: Right arm, Left arm, Chest, Abdomen, Front perineal area, Buttocks, Right upper leg, Left upper leg, Right lower leg, Left lower leg Body parts bathed by helper: Back  Bathing assist Assist Level: Touching or steadying assistance(Pt > 75%)      Upper Body Dressing/Undressing Upper body dressing   What is the patient wearing?: Pull over shirt/dress     Pull over shirt/dress - Perfomed by patient: Thread/unthread right sleeve, Thread/unthread left sleeve, Put head through opening, Pull shirt over trunk          Upper body assist Assist Level: Set up   Set up : To obtain clothing/put away  Lower Body Dressing/Undressing Lower body dressing   What is the patient wearing?: Underwear, Pants, Socks, Shoes Underwear - Performed by patient: Thread/unthread right underwear leg, Pull underwear up/down, Thread/unthread left underwear leg Underwear - Performed by helper: Thread/unthread right underwear leg, Thread/unthread left underwear leg Pants- Performed by patient: Thread/unthread left pants leg, Pull pants up/down, Thread/unthread right pants leg Pants- Performed by helper: Thread/unthread right pants leg   Non-skid slipper socks- Performed by helper: Don/doff right sock, Don/doff left sock Socks - Performed by patient: Don/doff right sock, Don/doff left sock  Shoes - Performed by helper: Don/doff right shoe, Don/doff left shoe          Lower body assist Assist for lower body dressing: Touching or steadying assistance (Pt > 75%)      Toileting Toileting     Toileting steps completed by helper:  Adjust clothing prior to toileting, Performs perineal hygiene, Adjust clothing after toileting Toileting Assistive Devices: Grab bar or rail  Toileting assist Assist level: Touching or steadying assistance (Pt.75%), Two helpers   Transfers Chair/bed transfer   Chair/bed transfer method: Stand pivot Chair/bed transfer assist level: Touching or steadying assistance (Pt > 75%) Chair/bed transfer assistive device: Medical sales representative     Max distance: 55 Assist level: Touching or steadying assistance (Pt > 75%)   Wheelchair   Type: Manual Max wheelchair distance: 80 Assist Level: Supervision or verbal cues  Cognition Comprehension Comprehension assist level: Follows basic conversation/direction with no assist  Expression Expression assist level: Expresses basic needs/ideas: With no assist  Social Interaction Social Interaction assist level: Interacts appropriately with others - No medications needed.  Problem Solving Problem solving assist level: Solves basic 90% of the time/requires cueing < 10% of the time  Memory Memory assist level: Recognizes or recalls 75 - 89% of the time/requires cueing 10 - 24% of the time   Medical Problem List and Plan: 1. 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 DVT Prophylaxis/Anticoagulation: Pharmaceutical: Coumadin. Appreciate pharmacy assist.   INR is therapeutic 3. Chronic back pain/Pain Management: tylenol only at present 4. Mood: LCSW to follow for evaluation and support.  5. Neuropsych: This patient is not yet capable of making decisions on his own behalf. 6. Skin/Wound Care: Dry dressing daily. Monitor wound for healing. Added protein supplement to help with low protein stores 7. Fluids/Electrolytes/Nutrition: Continue IVF at night to maintain adequate hydration status.  8. Acute renal failure: Cr sl elevated. Continue  to encourage po honey thick liquids.   -Cr 1.27 on 3/10. Continue check serially  -Increased QHS IVF to 100cc/hr  -Will order labs for tomorrow and will consider d/c IVF at that point 9. A fib with RVR: Monitor HR bid. On amiodarone and coumadin --monitor for any symptoms with activity.  10. PMR/RA: Has completed stress dose steroids--now on maintaince dose.  11. Anemia of chronic illness/iron deficiency: On iron supplement. hgb 11.2 12. GERD: protonix 13. DMT2: Currently on Lantus 10 units at bedtime but used glucotrol 5 mg daily at home .   Monitor BS ac/hs with use SSI for now.   Transition to oral hypoglycemic when intake is more consistent.   Fasting CBG 82 this AM,   Will continue to monitor.   Ate 75-100% of meals yesterday.  If pt continues to eat well, will consider transition to PO meds 14. CAD: Off ASA/plavix-- now on coumadin and lipitor.  15. Dysphagia: Dysphagia 1, honey liquids by tsp. Needs full supervision at meals. IVF at nights   16. Cough: no signs this morning  -CXR with ?atelectasis left base  -encourage IS, OoB.   LOS (Days) 5 A FACE TO FACE EVALUATION WAS PERFORMED  Ankit Lorie Phenix 08/22/2015 1:35 PM

## 2015-08-23 ENCOUNTER — Inpatient Hospital Stay (HOSPITAL_COMMUNITY): Payer: Medicare Other | Admitting: Speech Pathology

## 2015-08-23 ENCOUNTER — Inpatient Hospital Stay (HOSPITAL_COMMUNITY): Payer: Medicare Other

## 2015-08-23 ENCOUNTER — Inpatient Hospital Stay (HOSPITAL_COMMUNITY): Payer: Medicare Other | Admitting: Physical Therapy

## 2015-08-23 DIAGNOSIS — R791 Abnormal coagulation profile: Secondary | ICD-10-CM | POA: Insufficient documentation

## 2015-08-23 DIAGNOSIS — E119 Type 2 diabetes mellitus without complications: Secondary | ICD-10-CM

## 2015-08-23 LAB — CBC WITH DIFFERENTIAL/PLATELET
BASOS ABS: 0 10*3/uL (ref 0.0–0.1)
Basophils Relative: 0 %
EOS ABS: 0.2 10*3/uL (ref 0.0–0.7)
EOS PCT: 2 %
HCT: 31 % — ABNORMAL LOW (ref 39.0–52.0)
Hemoglobin: 9.6 g/dL — ABNORMAL LOW (ref 13.0–17.0)
LYMPHS PCT: 39 %
Lymphs Abs: 3.5 10*3/uL (ref 0.7–4.0)
MCH: 30 pg (ref 26.0–34.0)
MCHC: 31 g/dL (ref 30.0–36.0)
MCV: 96.9 fL (ref 78.0–100.0)
MONO ABS: 0.9 10*3/uL (ref 0.1–1.0)
Monocytes Relative: 10 %
Neutro Abs: 4.3 10*3/uL (ref 1.7–7.7)
Neutrophils Relative %: 48 %
PLATELETS: 245 10*3/uL (ref 150–400)
RBC: 3.2 MIL/uL — ABNORMAL LOW (ref 4.22–5.81)
RDW: 16 % — AB (ref 11.5–15.5)
WBC: 8.9 10*3/uL (ref 4.0–10.5)

## 2015-08-23 LAB — GLUCOSE, CAPILLARY
GLUCOSE-CAPILLARY: 179 mg/dL — AB (ref 65–99)
Glucose-Capillary: 88 mg/dL (ref 65–99)
Glucose-Capillary: 164 mg/dL — ABNORMAL HIGH (ref 65–99)
Glucose-Capillary: 186 mg/dL — ABNORMAL HIGH (ref 65–99)

## 2015-08-23 LAB — BASIC METABOLIC PANEL
ANION GAP: 7 (ref 5–15)
BUN: 10 mg/dL (ref 6–20)
CALCIUM: 7.8 mg/dL — AB (ref 8.9–10.3)
CO2: 24 mmol/L (ref 22–32)
Chloride: 108 mmol/L (ref 101–111)
Creatinine, Ser: 1.19 mg/dL (ref 0.61–1.24)
GFR calc Af Amer: >60 mL/min (ref >60)
GFR, EST NON AFRICAN AMERICAN: 58 mL/min — AB (ref >60)
GLUCOSE: 99 mg/dL (ref 65–99)
Potassium: 4.4 mmol/L (ref 3.5–5.1)
SODIUM: 139 mmol/L (ref 135–145)

## 2015-08-23 LAB — PROTIME-INR
INR: 1.83 — AB (ref 0.00–1.49)
Prothrombin Time: 21.1 seconds — ABNORMAL HIGH (ref 11.6–15.2)

## 2015-08-23 MED ORDER — ENOXAPARIN SODIUM 80 MG/0.8ML ~~LOC~~ SOLN
80.0000 mg | Freq: Two times a day (BID) | SUBCUTANEOUS | Status: DC
  Administered 2015-08-23 – 2015-09-01 (×17): 80 mg via SUBCUTANEOUS
  Filled 2015-08-23 (×22): qty 0.8

## 2015-08-23 MED ORDER — WARFARIN SODIUM 2.5 MG PO TABS
2.5000 mg | ORAL_TABLET | Freq: Every day | ORAL | Status: DC
  Administered 2015-08-23 – 2015-08-24 (×2): 2.5 mg via ORAL
  Filled 2015-08-23 (×2): qty 1

## 2015-08-23 NOTE — Progress Notes (Signed)
ANTICOAGULATION CONSULT NOTE - Follow Up Consult  Pharmacy Consult for coumadin Indication: atrial fibrillation  Allergies  Allergen Reactions  . Ace Inhibitors Other (See Comments)    Causes high K  . Angiotensin Receptor Blockers Other (See Comments)    Causes high K  . Metoprolol Other (See Comments)    May cause elevated K level  . Penicillins Swelling and Rash  . Tetracycline Swelling and Rash    Patient Measurements: Weight: 170 lb 6.7 oz (77.3 kg) Heparin Dosing Weight:   Vital Signs: Temp: 98.3 F (36.8 C) (03/13 0541) Temp Source: Oral (03/13 0541)  Labs:  Recent Labs  08/21/15 0440 08/22/15 0713 08/23/15 0541  HGB  --   --  9.6*  HCT  --   --  31.0*  PLT  --   --  245  LABPROT 32.3* 26.6* 21.1*  INR 3.23* 2.49* 1.83*  CREATININE  --   --  1.19    Estimated Creatinine Clearance: 51.1 mL/min (by C-G formula based on Cr of 1.19).  Assessment: 75 yo male with afib on warfarin. INR down to 1.83, pharmacy is asked to start lovenox to until INR back in therapeutic range again. Hg 9.6, pltc 245K, overall stable. Scr 1.19, est. crcl ~ 50 ml/min.   Goal of Therapy:  INR 2-3 Monitor platelets by anticoagulation protocol: Yes   Plan:  - Warfarin 2.5 mg po daily - Lovenox 80 mg sq Q 12 hrs - Daily PT/INR - Monitor s/sx bleeding  Maryanna Shape, PharmD, BCPS  Clinical Pharmacist  Pager: [phone]   08/23/2015,11:49 AM

## 2015-08-23 NOTE — Progress Notes (Signed)
Occupational Therapy Session Note  Patient Details  Name: Curtis Frazier MRN: [id number] Date of Birth: December 06, 1940  Today's Date: 08/23/2015 OT Individual Time: 0700-0826 OT Individual Time Calculation (min): 86 min    Short Term Goals: Week 1:  OT Short Term Goal 1 (Week 1): Pt will be able to tolerate standing for at least 2 minutes without changes in vital signs to be more independent with toileting. OT Short Term Goal 2 (Week 1): Pt will complete toileting with steady A. OT Short Term Goal 3 (Week 1): Pt will use AE to don LB clothing with min A. OT Short Term Goal 4 (Week 1): Pt will use long sponge to wash feet. OT Short Term Goal 5 (Week 1): Pt will complete smooth stand to sit with steadying A.  Skilled Therapeutic Interventions/Progress Updates:    Pt asleep in bed upon arrival and required min multimodal cues to arouse and orient to place and situation.  Pt engaged in BADL retraining including bathing and dressing with sit<>stand from w/c at sink.  Pt used AE to assist with LB dressing tasks.  Pt required mod verbal and demonstration cues for use of sock aide.  Pt required steady A for all tasks in standing.  Pt requires more than a reasonable amount of time to complete all tasks with multiple rest breaks.  Pt noted with SOB after short periods of tasks in standing.  Focus on activity tolerance, sit<>stand, standing balance, and safety awareness to increase independence with BADLs.  Therapy Documentation Precautions:  Precautions Precautions: Fall, Back Precaution Comments: Recent spine surgery 2/2 Restrictions Weight Bearing Restrictions: No Pain:  Pt denied pain  ADL: ADL ADL Comments: Refer to functional navigator   See Function Navigator for Current Functional Status.   Therapy/Group: Individual Therapy  Leroy Libman 08/23/2015, 8:26 AM

## 2015-08-23 NOTE — Progress Notes (Signed)
[facility] PHYSICAL MEDICINE & REHABILITATION     PROGRESS NOTE  Subjective/Complaints: Pt eating breakfast this AM, states that he is doing "fair".    ROS: Denies CP, SOB, N/V/D.  Objective: Vital Signs: Blood pressure 110/68, pulse 100, temperature 98.3 F (36.8 C), temperature source Oral, resp. rate 17, weight 77.3 kg (170 lb 6.7 oz), SpO2 94 %. No results found.  Recent Labs  08/23/15 0541  WBC 8.9  HGB 9.6*  HCT 31.0*  PLT 245    Recent Labs  08/23/15 0541  NA 139  K 4.4  CL 108  GLUCOSE 99  BUN 10  CREATININE 1.19  CALCIUM 7.8*   CBG (last 3)   Recent Labs  08/22/15 1647 08/22/15 2053 08/23/15 0646  GLUCAP 272* 142* 88    Wt Readings from Last 3 Encounters:  08/23/15 77.3 kg (170 lb 6.7 oz)  08/17/15 75.3 kg (166 lb 0.1 oz)  08/03/15 80.1 kg (176 lb 9.4 oz)    Physical Exam:  Constitutional: He appears well-developed and well-nourished. No acute distress. Vital signs reviewed HENT: Normocephalic. Atraumatic Eyes: Conjunctivae and EOM are normal.  Neck: Normal range of motion. Neck supple.  Cardiovascular: Normal rate. An irregularly irregular rhythm present.  Respiratory: Effort normal and breath sounds normal. No respiratory distress. He has no wheezes. He exhibits no tenderness.  GI: He exhibits no distension. There is no tenderness.  Musculoskeletal: He exhibits no edema or tenderness.  Neurological: He is alert and oriented 3.  B/l UE 5/5 prox to distal.  B/l LE: 4 -/5 hip flexion, knee extension 4 -/5 ankle dorsi/plantarflexion Skin: Skin is warm and dry. He is not diaphoretic.  Dry  skin.  Back incision C/D/I Intention tremors noted  Psychiatric: He has a normal mood and affect. His behavior is normal.   Assessment/Plan: 1. 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 Physiatrist is providing  close team supervision and 24 hour management of active medical problems listed below. Physiatrist and rehab team continue to assess barriers to discharge/monitor patient progress toward functional and medical goals.  Function:  Bathing Bathing position Bathing activity did not occur: Refused Position: Wheelchair/chair at sink  Bathing parts Body parts bathed by patient: Right arm, Left arm, Chest, Abdomen, Front perineal area, Buttocks, Right upper leg, Left upper leg, Right lower leg, Left lower leg Body parts bathed by helper: Back  Bathing assist Assist Level: Touching or steadying assistance(Pt > 75%)      Upper Body Dressing/Undressing Upper body dressing   What is the patient wearing?: Pull over shirt/dress     Pull over shirt/dress - Perfomed by patient: Thread/unthread right sleeve, Thread/unthread left sleeve, Put head through opening, Pull shirt over trunk          Upper body assist Assist Level: More than reasonable time   Set up : To obtain clothing/put away  Lower Body Dressing/Undressing Lower body dressing   What is the patient wearing?: Underwear, Pants, Socks, Shoes Underwear - Performed by patient: Thread/unthread right underwear leg, Pull underwear up/down, Thread/unthread left underwear leg Underwear - Performed by helper: Thread/unthread right underwear leg, Thread/unthread left underwear leg Pants- Performed by patient: Thread/unthread left pants leg, Pull pants up/down, Thread/unthread right pants leg Pants- Performed by helper: Thread/unthread right pants leg   Non-skid slipper socks- Performed by helper: Don/doff right sock, Don/doff left sock Socks - Performed by patient: Don/doff right  sock, Don/doff left sock   Shoes - Performed by patient: Don/doff left shoe Shoes - Performed by helper: Don/doff right shoe          Lower body assist Assist for lower body dressing: Touching or steadying assistance (Pt > 75%)      Toileting Toileting      Toileting steps completed by helper: Adjust clothing prior to toileting, Performs perineal hygiene, Adjust clothing after toileting Toileting Assistive Devices: Grab bar or rail  Toileting assist Assist level: Touching or steadying assistance (Pt.75%), Two helpers   Transfers Chair/bed transfer   Chair/bed transfer method: Stand pivot, Ambulatory Chair/bed transfer assist level: Touching or steadying assistance (Pt > 75%) Chair/bed transfer assistive device: Walker, Air cabin crew     Max distance: 65 Assist level: Touching or steadying assistance (Pt > 75%)   Wheelchair   Type: Manual Max wheelchair distance: 200 Assist Level: Supervision or verbal cues  Cognition Comprehension Comprehension assist level: Understands basic 90% of the time/cues < 10% of the time  Expression Expression assist level: Expresses basic 90% of the time/requires cueing < 10% of the time.  Social Interaction Social Interaction assist level: Interacts appropriately 90% of the time - Needs monitoring or encouragement for participation or interaction.  Problem Solving Problem solving assist level: Solves complex 90% of the time/cues < 10% of the time  Memory Memory assist level: Recognizes or recalls 90% of the time/requires cueing < 10% of the time   Medical Problem List and Plan: 1. 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 DVT Prophylaxis/Anticoagulation: Pharmaceutical: Coumadin. Appreciate pharmacy assist.   INR is Subtherapeutic, appreciate pharmacy recs 3. Chronic back pain/Pain Management: tylenol only at present 4. Mood: LCSW to follow for evaluation and support.  5. Neuropsych: This patient is not yet capable of making decisions on his own behalf. 6. Skin/Wound Care: Dry dressing daily. Monitor wound for healing. Added protein supplement to help with low protein  stores 7. Fluids/Electrolytes/Nutrition: Continue IVF at night to maintain adequate hydration status.  8. Acute renal failure: Cr sl elevated. Continue to encourage po honey thick liquids.   -Cr 1.19 on 3/13. Continue check serially  -Will DC IVF  9. A fib with RVR: Monitor HR bid. On amiodarone and coumadin --monitor for any symptoms with activity.  10. PMR/RA: Has completed stress dose steroids--now on maintaince dose.  11. Anemia of chronic illness/iron deficiency: On iron supplement. hgb 11.2 12. GERD: protonix 13. DMT2: Currently on Lantus 10 units at bedtime but used glucotrol 5 mg daily at home .   Monitor BS ac/hs with use SSI for now.   Transition to oral hypoglycemic when intake is more consistent.   Fasting CBG 88 this AM,   Will continue to monitor.   Ate 100% of meals yesterday.  Will DC Lantus today and consider starting oral hypoglycemic tomorrow 14. CAD: Off ASA/plavix-- now on coumadin and lipitor.  15. Dysphagia: Dysphagia 1, honey liquids by tsp. Needs full supervision at meals. IVF at nights   16. Cough: no signs this morning  -CXR with ?atelectasis left base  -encourage IS, OOB.   LOS (Days) 6 A FACE TO FACE EVALUATION WAS PERFORMED  Ankit Lorie Phenix 08/23/2015 9:12 AM

## 2015-08-23 NOTE — Progress Notes (Signed)
Social Work  Social Work Assessment and Plan  Patient Details  Name: Curtis Frazier MRN: [id number] Date of Birth: 29-Apr-1941  Today's Date: 08/20/2015  Problem List:  Patient Active Problem List   Diagnosis Date Noted  . Subtherapeutic international normalized ratio (INR)   . Supratherapeutic INR   . Labile blood glucose   . Cough   . AKI (acute kidney injury) ([place])   . Encephalopathy, metabolic [id number]  . Debility   . 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 Dysphagia   . Spondylosis of lumbar region without myelopathy or radiculopathy   . Thrush 08/12/2015  . Urinary retention 08/12/2015  . Hypotension 08/11/2015  . Hypernatremia 08/10/2015  . Encephalopathy 08/10/2015  . Acute on chronic renal failure ([place]) 08/10/2015  . Chronic diastolic congestive heart failure ([place]) 08/10/2015  . Persistent atrial fibrillation ([place])   . Acute diastolic congestive heart failure ([place])   . Dyspnea   . Disorientation   . Slow transit constipation   . Back pain without radiation   . Counseling regarding advanced care planning and goals of care   . PAF (paroxysmal atrial fibrillation) ([place]) 07/29/2015  . Advanced care planning/counseling discussion   . Pneumonia   . Change in mental state   . Endotracheally intubated   . CHF (congestive heart failure) ([place])   . Palliative care encounter 07/21/2015  . DNR (do not resuscitate) discussion 07/21/2015  . HCAP (healthcare-associated pneumonia)   . Acute respiratory failure ([place])   . Acute respiratory failure with hypoxia ([place]) 07/18/2015  . Atrial fibrillation with RVR ([place]) 07/18/2015  . Acute pulmonary edema ([place]) 07/18/2015  . Sepsis ([place]) 07/17/2015  . Acute encephalopathy 07/17/2015  . LLL pneumonia 07/17/2015  . Type II or unspecified type diabetes mellitus without mention of complication, not stated as uncontrolled 07/17/2015  . S/P lumbar laminectomy  07/17/2015  . Elevated troponin 07/17/2015  . Type 2 diabetes mellitus without complication, without long-term current use of insulin ([place])   . Acute bronchitis 03/05/2015  . Back pain, thoracic 01/15/2015  . Chest wall pain 01/15/2015  . Routine general medical examination at a health care facility 11/23/2014  . Productive cough 10/14/2014  . Left flank pain 09/25/2014  . Renal insufficiency 11/14/2013  . Encounter for Medicare annual wellness exam 07/07/2013  . BPH (benign prostatic hyperplasia) 07/05/2012  . Prostate cancer screening 06/27/2012  . Low TSH level 01/22/2012  . PMR (polymyalgia rheumatica) (H[facility]) 01/20/2012  . Aortic stenosis 07/08/2011  . New onset a-fib ([place]) 07/05/2011    Class: Acute  . HYPERKALEMIA 02/18/2010  . Essential hypertension, benign 09/13/2009  . ANEMIA-UNSPECIFIED 06/10/2009  . ANGIODYSPLASIA-INTESTINE 06/10/2009  . MURMUR 05/21/2009  . Left carotid bruit 05/21/2009  . Coronary atherosclerosis 03/09/2009  . GERD 03/09/2009  . Rheumatoid arthritis ([place]) 04/29/2008  . HX, PERSONAL, COLONIC POLYPS 01/17/2007  . Diabetes mellitus type 2, controlled, without complications ([place]) [id number]  . Elevated lipids 09/18/2006  . PSORIATIC ARTHRITIS 09/18/2006  . PSORIASIS 09/18/2006  . 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 DEGENERATIVE DISC DISEASE 09/18/2006  . SCOLIOSIS 09/18/2006  . TOBACCO ABUSE, HX OF 09/18/2006   Past Medical History:  Past Medical History  Diagnosis Date  . Other and unspecified hyperlipidemia   . Coronary atherosclerosis of unspecified type of vessel, native or graft   . Personal history of colonic polyps   . Actinic keratosis   . Psoriatic arthropathy ([place])   .  Other psoriasis   . Scoliosis (and kyphoscoliosis), idiopathic   . GI bleed 12/10    cecam AVM  . Gastritis 12/10  . Hyperlipidemia     takes Pravastatin daily  . Hypertension     takes Metoprolol daily  . Shortness of breath     with exertion  . Headache(784.0)      occasionally  . Joint pain   . Joint swelling   . Chronic back pain     scoliosis/stenosis/radiculopathy,degenerative disc disease  . Psoriasis   . Bruises easily   . Esophageal reflux     takes Omeprazole daily  . History of colonic polyps   . Hemorrhoids   . Kidney stone     hx of  . Blood transfusion 2010  . PMR (polymyalgia rheumatica) ([facility]) 01/20/2012    tx by specialist on a long prednisone taper  . S/P PTCA (percutaneous transluminal coronary angioplasty)   . Rheumatoid arthritis(714.0)     takes Methotrexate 7pills weekly  . Degeneration of intervertebral disc, site unspecified   . Myocardial infarction ([place]) 2010  . Pneumonia 2008; 07/2015  . Type II or unspecified type diabetes mellitus without mention of complication, not stated as uncontrolled   . Skin cancer     "arms"   Past Surgical History:  Past Surgical History  Procedure Laterality Date  . Rotator cuff repair Left 2000  . Colonoscopy    . Vasectomy  1979  . Coronary angioplasty with stent placement  02/2009    has one stent  . Carotid doppler  10/12    0-39% R and 60-79% left   . Cataract extraction  2012  . Cardiac catheterization  2010  . Lithotripsy  2009  . Tee without cardioversion N/A 08/06/2015    Procedure: TRANSESOPHAGEAL ECHOCARDIOGRAM (TEE);  Surgeon: Skeet Latch, MD;  Location: [facility];  Service: Cardiovascular;  Laterality: N/A;  . Cardioversion N/A 08/06/2015    Procedure: CARDIOVERSION;  Surgeon: Skeet Latch, MD;  Location[facility];  Service: Cardiovascular;  Laterality: N/A;  . Back surgery    . Lumbar laminectomy  07/2015  . Posterior laminectomy thoracic spine      "fixed the discs"  . Skin cancer excision      "arms"   Social History:  reports that he has quit smoking. His smoking use included Cigarettes. He has a 79.5 pack-year smoking history. He has never used smokeless tobacco. He reports that he drinks alcohol. He reports that he does not use illicit  drugs.  Family / Support Systems Marital Status: Married Patient Roles: Spouse, Parent Spouse/Significant Other: wife, Cypher Lowell @ [phone] or [phone] Children: daughter, [name] @ [phone]; son, Bow Ader @ [phone];  son, Eilan Matsunaga @ [phone] plus 3 more adult children: Lanier Prude and Louie Casa (he is only one living out of town) Anticipated Caregiver: wife is in the home 24/7 but daughters report she feels overwhelmed right now with pt's illness.  Daughters available to assist as needed.  Ability/Limitations of Caregiver: wife "overwhelmed" per family and the daughters report she cannot provide any physical assistance Caregiver Availability: 24/7 Family Dynamics: Wife can really only provide supervision.  5 of the 6 children live very close by and one daughter, [name] does work from home.  Children are all very supportive.  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  Offers very direct, brief answers and affect rather flat.  He often looks to daughter and son in the room to add information.  He admits to poor memory for initial part of admission but feels his cognition is improving.  Daughter reports that his short term memory is "pretty impaired".  Pt denies any s/s of significant emotional distress.  Admits he is  frustrated with his memory and his physical limitations.  Will monitor and refer for neuropsych as indidcated.  Testing needs? Recent Psychosocial Issues: None Pyschiatric History: None Substance Abuse History: None  Patient / Family Perceptions, Expectations & Goals Pt/Family understanding of illness & functional limitations: Family/ children with very good understanding of the medical course pt has gone through and his quick decline.  Pt with basic understanding of the medical events, however, dwells on the fact that he has no memory of his return to the hospital.   Premorbid pt/family roles/activities: Pt was completely independent prior to his surgery on 07/15/15 with expectation "this would be a pretty simple procedure".  Pt had finally retired in Nov and was completely independent at home and community. Anticipated changes in roles/activities/participation: Pt's goals have been set for mod independent, however, he nay require some supervision dependent on cognitive improvements. Pt/family expectations/goals: Pt and family hopeful he can reach at least a supervision level which will lessen the care burden on wife.  Community Resources [employer]: None Premorbid Home Care/DME Agencies: None Transportation available at discharge: yes Resource referrals recommended: Neuropsychology  Discharge Planning Living Arrangements: Spouse/significant other Support Systems: Spouse/significant other, Children Type of Residence: Private residence Insurance underwriter Resources: [employer], Multimedia programmer (specify) Nurse, mental health) Financial Resources: Social Security Living Expenses: Own Money Management: Patient Does the patient have any problems obtaining your medications?: No Home Management: pt and wife Patient/Family Preliminary Plans: Pt to return home with wife as primary support, however, adult children can check on them frequently. Social Work Anticipated Follow Up Needs: HH/OP Expected length of stay:  14-16 days  Clinical Impression Very unfortunate gentleman who underwent outpatient back surgery and then developed a significant infection leading to sepsis and VDRF.  Soft-spoken and not very talkative.  Family at bedside and assists with completion of assessment interview.  He admits frustration with little recall of earlier part of hospitalization and with his physical limitations as he had expected this to be "an easy surgery".  He denies any s/s of any significant emotional distress, however, will monitor.  Wife can provide supervision only and family describes her as "a little overwhelmed right now...".  Will follow for support and d/c planning needs.  Solimar Maiden 08/20/2015, 4:00 PM

## 2015-08-23 NOTE — Progress Notes (Signed)
Speech Language Pathology Daily Session Note  Patient Details  Name: Curtis Frazier MRN: [id number] Date of Birth: 05/02/1941  Today's Date: 08/23/2015 SLP Individual Time: 0900-1000 SLP Individual Time Calculation (min): 60 min  Short Term Goals: Week 1: SLP Short Term Goal 1 (Week 1): Patient will demonstrate selective attention to a functional task in a mildly distracting enviornment for 30 minutes with Min A verbal cues for redirection.  SLP Short Term Goal 2 (Week 1): Patient will utilize external memory aids to recall new, daily information with Min A verbal and questioning cues.  SLP Short Term Goal 3 (Week 1): Patient will consume current diet with minimal overt s/s of aspiration with Mod A verbal cues for use of swallowing compensatory strategies.  SLP Short Term Goal 4 (Week 1): Patient will demonstrate efficient mastication with trials of Dys. 2 textures demonstrated by minimal oral residue over 2 consecutive sessions prior to upgrade.  SLP Short Term Goal 5 (Week 1): Patient will consume trials of nectar-thick liquids via tsp with minimal overt s/s of aspiration with Min A verbal cues for use of swallowing comepnsatory strategies over 2 consecutive sesions prior to discharge.  SLP Short Term Goal 6 (Week 1): Patient will perform pharyngeal strengthening exercises with supervision verbal cues for accuracy.   Skilled Therapeutic Interventions: Skilled session focused on dysphagia and cognition goals. ST facilitated session by providing trials of ice chips via spoon. Pt administered ice chips himself and initially required max verbal cues for rate and double swallow. Pt with cough x 1 possibly due to fast rate. Pt able to self-monitor with mod verbal cues after cough. No further overt s/s of aspiration noted with trials of ice chips. Good progress made with dysphagia 2 trials as evidenced by complete oral clearing and multiple swallows.  Increased hyolaryngeal excursion noted during all trials.   Pt appears to be a good candidate for an objective swallow study to assess possibility of advancing diet. Pt with mod to max verbal cues for new simple functional problem solving task. Pt required cues to plan, organize information and correct errors. Daughter present for part of session and education provided on ability with PO trials and pending Modified Barium Swallow Study. Pt left in room with daughter.   Function:  Eating Eating   Modified Consistency Diet: Yes Eating Assist Level: Supervision or verbal cues;Set up assist for   Eating Set Up Assist For: Opening containers Helper Scoops Food on Utensil: Occasionally     Cognition Comprehension Comprehension assist level: Understands basic 75 - 89% of the time/ requires cueing 10 - 24% of the time  Expression   Expression assist level: Expresses basic 90% of the time/requires cueing < 10% of the time.  Social Interaction Social Interaction assist level: Interacts appropriately 90% of the time - Needs monitoring or encouragement for participation or interaction.  Problem Solving Problem solving assist level: Solves basic 50 - 74% of the time/requires cueing 25 - 49% of the time  Memory Memory assist level: Recognizes or recalls 50 - 74% of the time/requires cueing 25 - 49% of the time    Pain    Therapy/Group: Individual Therapy  Ernesta Trabert 08/23/2015, 3:23 PM

## 2015-08-23 NOTE — Progress Notes (Signed)
Physical Therapy Session Note  Patient Details  Name: Curtis Frazier MRN: [id number] Date of Birth: [birth year]  Today's Date: 08/23/2015 PT Individual Time: 1330-1430 PT Individual Time Calculation (min): 60 min   Short Term Goals: Week 1:  PT Short Term Goal 1 (Week 1): Patient will perform transfers with supervision. PT Short Term Goal 2 (Week 1): Patient will ambulate 50 ft using LRAD with supervision. PT Short Term Goal 3 (Week 1): Patient will negotiate up/down 1 step with 1 rail with min A.  PT Short Term Goal 4 (Week 1): Patient will maintain standing balance x 1 min with min A.   Skilled Therapeutic Interventions/Progress Updates:    Pt received resting in bed with no c/o pain and agreeable to therapy session.  Session focus on activity tolerance, transfers, and endurance.  Pt transitioned supine>sit with supervision and stand/pivot to w/c with no AD and steady assist.  Pt propelled w/c to therapy gym with supervision and 1 short rest break.  Pt engaged in activity to improve endurance and LE strength on kinetron from w/c position at 30 cm/s x10 min with 5 rest breaks throughout.  Stand/pivot to therapy mat with close supervision.  Standing tolerance activity at pipe tree with pt not able to stand >2 minutes at a time.  Pt stood total of 5 times ranging from 30 seconds to 2 minutes.  Pt with noted increase in respiratory rate after each trial, HR range from 98-115.  Pt educated in effects of deconditioning and verbalized understanding.  Stand/pivot back to w/c with steady assist and RW.  PT wrapped theraband around w/c rims for increased grip and pt propelled back to room supervision and left in w/c with call bell in reach and needs met.    Therapy Documentation Precautions:  Precautions Precautions: Fall, Back Precaution Comments: Recent spine surgery 2/2 Restrictions Weight Bearing Restrictions: No   See Function Navigator for Current Functional Status.   Therapy/Group: Individual  Therapy  Earnest Conroy Penven-Crew 08/23/2015, 3:40 PM

## 2015-08-23 NOTE — Care Management Note (Signed)
[facility] Individual Statement of Services  Patient Name:  MORIAH HUFFINES  Date:  08/20/2015  Welcome to the [facility].  Our goal is to provide you with an individualized program based on your diagnosis and situation, designed to meet your specific needs.  With this comprehensive rehabilitation program, you will be expected to participate in at least 3 hours of rehabilitation therapies Monday-Friday, with modified therapy programming on the weekends.  Your rehabilitation program will include the following services:  Physical Therapy (PT), Occupational Therapy (OT), Speech Therapy (ST), 24 hour per day rehabilitation nursing, Therapeutic Recreaction (TR), Neuropsychology, Case Management (Social Worker), Rehabilitation Medicine, Nutrition Services and Pharmacy Services  Weekly team conferences will be held on Tuesdays to discuss your progress.  Your Social Worker will talk with you frequently to get your input and to update you on team discussions.  Team conferences with you and your family in attendance may also be held.  Expected length of stay: 14-16 days  Overall anticipated outcome: modified independent  Depending on your progress and recovery, your program may change. Your Social Worker will coordinate services and will keep you informed of any changes. Your Social Worker's name and contact numbers are listed  below.  The following services may also be recommended but are not provided by the [facility] will be made to provide these services after discharge if needed.  Arrangements include referral to agencies that provide these services.  Your insurance has been verified to be:  Medicare and [place] Your primary doctor is:  [facility]  Pertinent information will be shared with your doctor and your insurance  company.  Social Worker:  [place], [place] or ([phone]   Information discussed with and copy given to patient by: Lennart Pall, 08/20/2015, 4:12 PM

## 2015-08-24 ENCOUNTER — Inpatient Hospital Stay (HOSPITAL_COMMUNITY): Payer: Medicare Other | Admitting: Speech Pathology

## 2015-08-24 ENCOUNTER — Inpatient Hospital Stay (HOSPITAL_COMMUNITY): Payer: Medicare Other | Admitting: Physical Therapy

## 2015-08-24 ENCOUNTER — Inpatient Hospital Stay (HOSPITAL_COMMUNITY): Payer: Medicare Other | Admitting: *Deleted

## 2015-08-24 ENCOUNTER — Inpatient Hospital Stay (HOSPITAL_COMMUNITY): Payer: Medicare Other

## 2015-08-24 LAB — BASIC METABOLIC PANEL
Anion gap: 10 (ref 5–15)
BUN: 9 mg/dL (ref 6–20)
CO2: 24 mmol/L (ref 22–32)
CREATININE: 1.17 mg/dL (ref 0.61–1.24)
Calcium: 8.1 mg/dL — ABNORMAL LOW (ref 8.9–10.3)
Chloride: 107 mmol/L (ref 101–111)
GFR calc Af Amer: >60 mL/min (ref >60)
GFR, EST NON AFRICAN AMERICAN: 59 mL/min — AB (ref >60)
Glucose, Bld: 108 mg/dL — ABNORMAL HIGH (ref 65–99)
Potassium: 4.2 mmol/L (ref 3.5–5.1)
SODIUM: 141 mmol/L (ref 135–145)

## 2015-08-24 LAB — GLUCOSE, CAPILLARY
GLUCOSE-CAPILLARY: 105 mg/dL — AB (ref 65–99)
Glucose-Capillary: 174 mg/dL — ABNORMAL HIGH (ref 65–99)
Glucose-Capillary: 207 mg/dL — ABNORMAL HIGH (ref 65–99)
Glucose-Capillary: 237 mg/dL — ABNORMAL HIGH (ref 65–99)

## 2015-08-24 LAB — PROTIME-INR
INR: 1.81 — AB (ref 0.00–1.49)
Prothrombin Time: 20.9 seconds — ABNORMAL HIGH (ref 11.6–15.2)

## 2015-08-24 MED ORDER — WARFARIN SODIUM 2.5 MG PO TABS: 2.5000 mg | ORAL_TABLET | Freq: Once | ORAL | Status: DC

## 2015-08-24 NOTE — Patient Care Conference (Signed)
Inpatient RehabilitationTeam Conference and Plan of Care Update Date: 08/24/2015   Time: 10:35 AM    Patient Name: Curtis Frazier      Medical Record Number: [id number]  Date of Birth: [birth year] Sex: Male         Room/Bed: 4W09C/4W09C-01 Payor Info: Payor: MEDICARE / Plan: MEDICARE PART A AND B / Product Type: *No Product type* /    Admitting Diagnosis: ENCEPH DECONDITION  Admit Date/Time:  08/17/2015  5:36 PM Admission Comments: No comment available   Primary Diagnosis:  <principal problem not specified> Principal Problem: <principal problem not specified>  Patient Active Problem List   Diagnosis Date Noted  . Subtherapeutic international normalized ratio (INR)   . Supratherapeutic INR   . Labile blood glucose   . Cough   . AKI (acute kidney injury) ([place])   . Encephalopathy, metabolic [id number]  . Debility   . 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 Dysphagia   . Spondylosis of lumbar region without myelopathy or radiculopathy   . Thrush 08/12/2015  . Urinary retention 08/12/2015  . Hypotension 08/11/2015  . Hypernatremia 08/10/2015  . Encephalopathy 08/10/2015  . Acute on chronic renal failure ([place]) 08/10/2015  . Chronic diastolic congestive heart failure ([place]) 08/10/2015  . Persistent atrial fibrillation ([place])   . Acute diastolic congestive heart failure ([place])   . Dyspnea   . Disorientation   . Slow transit constipation   . Back pain without radiation   . Counseling regarding advanced care planning and goals of care   . PAF (paroxysmal atrial fibrillation) ([place]) 07/29/2015  . Advanced care planning/counseling discussion   . Pneumonia   . Change in mental state   . Endotracheally intubated   . CHF (congestive heart failure) ([place])   . Palliative care encounter 07/21/2015  . DNR (do not resuscitate) discussion 07/21/2015  . HCAP (healthcare-associated pneumonia)   . Acute respiratory  failure ([place])   . Acute respiratory failure with hypoxia ([place]) 07/18/2015  . Atrial fibrillation with RVR ([place]) 07/18/2015  . Acute pulmonary edema ([place]) 07/18/2015  . Sepsis ([place]) 07/17/2015  . Acute encephalopathy 07/17/2015  . LLL pneumonia 07/17/2015  . Type II or unspecified type diabetes mellitus without mention of complication, not stated as uncontrolled 07/17/2015  . S/P lumbar laminectomy 07/17/2015  . Elevated troponin 07/17/2015  . Type 2 diabetes mellitus without complication, without long-term current use of insulin ([place])   . Acute bronchitis 03/05/2015  . Back pain, thoracic 01/15/2015  . Chest wall pain 01/15/2015  . Routine general medical examination at a health care facility 11/23/2014  . Productive cough 10/14/2014  . Left flank pain 09/25/2014  . Renal insufficiency 11/14/2013  . Encounter for Medicare annual wellness exam 07/07/2013  . BPH (benign prostatic hyperplasia) 07/05/2012  . Prostate cancer screening 06/27/2012  . Low TSH level 01/22/2012  . PMR (polymyalgia rheumatica) (HCC) 01/20/2012  . Aortic stenosis 07/08/2011  . New onset a-fib ([place]) 07/05/2011    Class: Acute  . HYPERKALEMIA 02/18/2010  . Essential hypertension, benign 09/13/2009  . ANEMIA-UNSPECIFIED 06/10/2009  . ANGIODYSPLASIA-INTESTINE 06/10/2009  . MURMUR 05/21/2009  . Left carotid bruit 05/21/2009  . Coronary atherosclerosis 03/09/2009  . GERD 03/09/2009  . Rheumatoid arthritis ([place]) 04/29/2008  . HX, PERSONAL, COLONIC POLYPS 01/17/2007  . Diabetes mellitus type 2, controlled, without complications ([place]) [id number]  . Elevated lipids 09/18/2006  . PSORIATIC ARTHRITIS 09/18/2006  . PSORIASIS 09/18/2006  .  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 DEGENERATIVE DISC DISEASE 09/18/2006  . SCOLIOSIS 09/18/2006  . TOBACCO ABUSE, HX OF 09/18/2006    Expected Discharge Date: Expected Discharge Date: 09/02/15  Team Members Present: Physician leading conference: Dr. Delice Lesch Social Worker  Present: Lennart Pall, LCSW Nurse Present: Dorien Chihuahua, RN PT Present: Carney Living, PT OT Present: Roanna Epley, [place], OT SLP Present: Weston Anna, SLP PPS Coordinator present : Daiva Nakayama, RN, CRRN     Current Status/Progress Goal Weekly Team Focus  Medical   Debilitation and cognitive deficits secondary to encephalopathy and multiple medical complications after lumbar discectomy  Improve orthostasis, CBGs, AKI  see above   Bowel/Bladder   Continent of bowel and bladder. LBM 08/23/15  Pt to remain continent of bowel and bladder  Monitor   Swallow/Nutrition/ Hydration   Dys. 1 textures with honey-thick liquids via tsp, Mod A for use of swallow strategies   Min A with least restrictive diet  repeat MBS tomorrow, increase use of swallowing strategies    ADL's   min A/supervision overall; decreased activity tolerance;  mod I overall  activity tolerance, standing balance, functional transfers, family education   Mobility   S-min A, significantly decreased activity tolerance  mod I overall, supervision stairs and car  standing balance, functional mobility training, activity tolerance, pt/family education   Communication             Safety/Cognition/ Behavioral Observations  Mod A  Min A   functional problem solving, recall and attention   Pain   No c/o pain  <3  Monitor for nonverbal cues of pain   Skin   Stage 2 w/ Allevyn dressing to area  No additional skin breakdown  Monitor    Rehab Goals Patient on target to meet rehab goals: Yes Rehab Goals Revised: On track to meet supervision level goals. *See Care Plan and progress notes for long and short-term goals.  Barriers to Discharge: Orthostasis, AKI, DM, IVF    Possible Resolutions to Barriers:  TEDS, monitor labs, IVF recently d/ced, transition to home DM meds    Discharge Planning/Teaching Needs:  Home with wife and family arranging 24/7 assist/ supervision.  to be scheduled prior to d/c   Team  Discussion:  Still watching INR;  IVF stopped.  Easily becomes out of breath and fatigued;  Ted hose added yesterday due to LE swelling.  Poor endurance and very poor memory which combine to affect his safety and anticipate will need supervision.  Plan to repeat MBS tomorrow (pt has been a silent aspirator).  Stage 2 sacrum & open area at base of incision - MD aware.    Revisions to Treatment Plan:  None   Continued Need for Acute Rehabilitation Level of Care: The patient requires daily medical management by a physician with specialized training in physical medicine and rehabilitation for the following conditions: Daily direction of a multidisciplinary physical rehabilitation program to ensure safe treatment while eliciting the highest outcome that is of practical value to the patient.: Yes Daily medical management of patient stability for increased activity during participation in an intensive rehabilitation regime.: Yes Daily analysis of laboratory values and/or radiology reports with any subsequent need for medication adjustment of medical intervention for : Pulmonary problems;Post surgical problems;Diabetes problems  Cathlin Buchan 08/24/2015, 1:18 PM

## 2015-08-24 NOTE — Progress Notes (Signed)
Speech Language Pathology Daily Session Note  Patient Details  Name: Curtis Frazier MRN: [id number] Date of Birth: 1940-11-09  Today's Date: 08/24/2015 SLP Individual Time: 0800-0900 SLP Individual Time Calculation (min): 60 min  Short Term Goals: Week 1: SLP Short Term Goal 1 (Week 1): Patient will demonstrate selective attention to a functional task in a mildly distracting enviornment for 30 minutes with Min A verbal cues for redirection.  SLP Short Term Goal 2 (Week 1): Patient will utilize external memory aids to recall new, daily information with Min A verbal and questioning cues.  SLP Short Term Goal 3 (Week 1): Patient will consume current diet with minimal overt s/s of aspiration with Mod A verbal cues for use of swallowing compensatory strategies.  SLP Short Term Goal 4 (Week 1): Patient will demonstrate efficient mastication with trials of Dys. 2 textures demonstrated by minimal oral residue over 2 consecutive sessions prior to upgrade.  SLP Short Term Goal 5 (Week 1): Patient will consume trials of nectar-thick liquids via tsp with minimal overt s/s of aspiration with Min A verbal cues for use of swallowing comepnsatory strategies over 2 consecutive sesions prior to discharge.  SLP Short Term Goal 6 (Week 1): Patient will perform pharyngeal strengthening exercises with supervision verbal cues for accuracy.   Skilled Therapeutic Interventions: Skilled treatment session focused on dysphagia goals.  Upon arrival, patient was supine in bed and agreeable to participate in treatment session. SLP facilitated session by transferring the patient to the wheelchair to maximize arousal and positioning for PO intake. SLP provided Min-Mod A verbal cues for use of swallowing compensatory strategies with breakfast meal of Dys. 1 textures with honey-thick liquids via tsp. Patient consumed meal without overt s/s of aspiration. Patient also performed pharyngeal strengthening exercises with Min A verbal cues for  accuracy. Recommend repeat MBS tomorrow to assess for possible diet upgrade.  Patient left upright in wheelchair with all needs within reach. Continue with current plan of care.    Function:  Eating Eating   Modified Consistency Diet: Yes Eating Assist Level: Supervision or verbal cues           Cognition Comprehension Comprehension assist level: Understands basic 75 - 89% of the time/ requires cueing 10 - 24% of the time  Expression   Expression assist level: Expresses basic 90% of the time/requires cueing < 10% of the time.  Social Interaction Social Interaction assist level: Interacts appropriately 90% of the time - Needs monitoring or encouragement for participation or interaction.  Problem Solving Problem solving assist level: Solves basic 50 - 74% of the time/requires cueing 25 - 49% of the time  Memory Memory assist level: Recognizes or recalls 50 - 74% of the time/requires cueing 25 - 49% of the time    Pain No/Denies Pain   Therapy/Group: Individual Therapy  [place] 08/24/2015, 10:22 AM

## 2015-08-24 NOTE — Progress Notes (Signed)
Physical Therapy Session Note  Patient Details  Name: Curtis Frazier MRN: [id number] Date of Birth: [birth year]  Today's Date: 08/24/2015 PT Individual Time: 1520-1620 PT Individual Time Calculation (min): 60 min   Short Term Goals: Week 1:  PT Short Term Goal 1 (Week 1): Patient will perform transfers with supervision. PT Short Term Goal 2 (Week 1): Patient will ambulate 50 ft using LRAD with supervision. PT Short Term Goal 3 (Week 1): Patient will negotiate up/down 1 step with 1 rail with min A.  PT Short Term Goal 4 (Week 1): Patient will maintain standing balance x 1 min with min A.   Skilled Therapeutic Interventions/Progress Updates:   Session focused on standing balance, endurance, generalized strengthening, gait, and stairs. Patient propelled wheelchair using BUE x 150 ft with supervision and extra time but no rest breaks in controlled environment. Gait training using RW x 50 ft + 70 ft with close supervision and intermittent min guard due to scissoring gait with increased fatigue. To challenge standing balance/endurance, patient participated in 3 rounds of horseshoes, able to maintain dynamic standing balance with close supervision-min A x 40 sec + 60 sec + 115 sec. Stair training up/down 8 (3") stairs and up/down 4 (6") stairs using 2 rails with step-to pattern, ascending leading with RLE and descending leading with LLE. Standing with 2 rails for UE support, performed alternating step taps to 6" step x 15 reps + 20 reps. Patient instructed in negotiating up/down curb step using RW x 2 with close supervision-min guard. Patient required cues to keep RW closer to body throughout session. Patient propelled wheelchair back to room using BUE and BLE for strengthening and endurance with supervision. Patient required seated rest breaks between tasks but recovered in less time than previous sessions. Patient left sitting in wheelchair with ELRs and son and daughter in room.   Therapy  Documentation Precautions:  Precautions Precautions: Fall, Back Precaution Comments: Recent spine surgery 2/2 Restrictions Weight Bearing Restrictions: No Vital Signs: Therapy Vitals Temp: 97.6 F (36.4 C) Temp Source: Oral Pulse Rate: 92 Resp: 18 BP: (!) 105/49 mmHg Patient Position (if appropriate): Sitting Oxygen Therapy SpO2: 95 % O2 Device: Not Delivered Pain: Pain Assessment Pain Assessment: No/denies pain   See Function Navigator for Current Functional Status.   Therapy/Group: Individual Therapy  [name] 08/24/2015, 5:05 PM

## 2015-08-24 NOTE — Progress Notes (Signed)
ANTICOAGULATION CONSULT NOTE - Follow Up Consult  Pharmacy Consult for Coumadin Indication: atrial fibrillation  Allergies  Allergen Reactions  . Ace Inhibitors Other (See Comments)    Causes high K  . Angiotensin Receptor Blockers Other (See Comments)    Causes high K  . Metoprolol Other (See Comments)    May cause elevated K level  . Penicillins Swelling and Rash  . Tetracycline Swelling and Rash    Patient Measurements: Weight: 171 lb 4.8 oz (77.7 kg) Heparin Dosing Weight:   Vital Signs: Temp: 98.6 F (37 C) (03/14 0457) Temp Source: Oral (03/14 0457) BP: 110/64 mmHg (03/14 0457) Pulse Rate: 96 (03/14 0457)  Labs:  Recent Labs  08/22/15 0713 08/23/15 0541 08/24/15 0500  HGB  --  9.6*  --   HCT  --  31.0*  --   PLT  --  245  --   LABPROT 26.6* 21.1* 20.9*  INR 2.49* 1.83* 1.81*  CREATININE  --  1.19 1.17    Estimated Creatinine Clearance: 52 mL/min (by C-G formula based on Cr of 1.17).   Medications:  Scheduled:  . amiodarone  400 mg Oral Daily  . antiseptic oral rinse  7 mL Mouth Rinse BID  . atorvastatin  20 mg Oral q1800  . enoxaparin (LOVENOX) injection  80 mg Subcutaneous Q12H  . feeding supplement (GLUCERNA SHAKE)  237 mL Oral TID WC  . ferrous sulfate  325 mg Oral TID WC  . insulin aspart  0-15 Units Subcutaneous TID WC  . insulin aspart  0-5 Units Subcutaneous QHS  . insulin aspart  3 Units Subcutaneous BID WC  . pantoprazole  40 mg Oral Daily  . predniSONE  10 mg Oral Q breakfast  . senna  2 tablet Oral Daily  . sodium chloride flush  10-40 mL Intracatheter Q12H  . thiamine  100 mg Oral Daily  . warfarin  2.5 mg Oral q1800  . Warfarin - Pharmacist Dosing Inpatient   Does not apply q1800    Assessment: 75yo male on Coumadin for AFib, Lovenox until INR therapeutic.  INR 1.81 this AM, no CBC today, no bleeding.  Pt on Amiodarone, which may increase Coumadin effect.  Goal of Therapy:  INR 2-3 Monitor platelets by anticoagulation protocol:  Yes   Plan:  Repeat Coumadin 2.5mg  today Continue Lovenox Daily INR Watch for s/s of bleeding  Gracy Bruins, PharmD [facility]

## 2015-08-24 NOTE — Progress Notes (Signed)
Recreational Therapy Assessment and Plan  Patient Details  Name: Curtis Frazier MRN: [id number] Date of Birth: [birth year] Today's Date: 08/24/2015  Rehab Potential: Excellent ELOS: 2 weeks   Assessment Clinical Impression: Problem List:  Patient Active Problem List   Diagnosis Date Noted  . Encephalopathy, metabolic 98/04/9146  . Debility   . 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 Dysphagia   . Spondylosis of lumbar region without myelopathy or radiculopathy   . Thrush 08/12/2015  . Urinary retention 08/12/2015  . Hypotension 08/11/2015  . Hypernatremia 08/10/2015  . Encephalopathy 08/10/2015  . Acute on chronic renal failure ([place]) 08/10/2015  . Chronic diastolic congestive heart failure ([place]) 08/10/2015  . Persistent atrial fibrillation ([place])   . Acute diastolic congestive heart failure ([place])   . Dyspnea   . Disorientation   . Slow transit constipation   . Back pain without radiation   . Counseling regarding advanced care planning and goals of care   . PAF (paroxysmal atrial fibrillation) ([place]) 07/29/2015  . Advanced care planning/counseling discussion   . Pneumonia   . Change in mental state   . Endotracheally intubated   . CHF (congestive heart failure) ([place])   . Palliative care encounter 07/21/2015  . DNR (do not resuscitate) discussion 07/21/2015  . HCAP (healthcare-associated pneumonia)   . Acute respiratory failure ([place])   . Acute respiratory failure with hypoxia ([place]) 07/18/2015  . Atrial fibrillation with RVR ([place]) 07/18/2015  . Acute pulmonary edema ([place]) 07/18/2015  . Sepsis ([place]) 07/17/2015  . Acute encephalopathy 07/17/2015  . LLL pneumonia 07/17/2015  . Type II or unspecified type diabetes mellitus without mention of complication, not stated as uncontrolled  07/17/2015  . S/P lumbar laminectomy 07/17/2015  . Elevated troponin 07/17/2015  . Type 2 diabetes mellitus without complication, without long-term current use of insulin ([place])   . Acute bronchitis 03/05/2015  . Back pain, thoracic 01/15/2015  . Chest wall pain 01/15/2015  . Routine general medical examination at a health care facility 11/23/2014  . Productive cough 10/14/2014  . Left flank pain 09/25/2014  . Renal insufficiency 11/14/2013  . Encounter for Medicare annual wellness exam 07/07/2013  . BPH (benign prostatic hyperplasia) 07/05/2012  . Prostate cancer screening 06/27/2012  . Low TSH level 01/22/2012  . PMR (polymyalgia rheumatica) (H[facility]) 01/20/2012  . Aortic stenosis 07/08/2011  . New onset a-fib ([place]) 07/05/2011    Class: Acute  . HYPERKALEMIA 02/18/2010  . Essential hypertension, benign 09/13/2009  . ANEMIA-UNSPECIFIED 06/10/2009  . ANGIODYSPLASIA-INTESTINE 06/10/2009  . MURMUR 05/21/2009  . Left carotid bruit 05/21/2009  . Coronary atherosclerosis 03/09/2009  . GERD 03/09/2009  . Rheumatoid arthritis ([place]) 04/29/2008  . HX, PERSONAL, COLONIC POLYPS 01/17/2007  . Diabetes mellitus type 2, controlled, without complications ([place]) 82/95/6213  . Elevated lipids 09/18/2006  . PSORIATIC ARTHRITIS 09/18/2006  . PSORIASIS 09/18/2006  . 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 DEGENERATIVE DISC DISEASE 09/18/2006  . SCOLIOSIS 09/18/2006  . TOBACCO ABUSE, HX OF 09/18/2006    Past Medical History:  Past Medical History  Diagnosis Date  . Other and unspecified hyperlipidemia   . Coronary atherosclerosis of unspecified type of vessel, native or graft   . Personal history of colonic polyps   . Actinic keratosis   . Psoriatic arthropathy ([place])   . Other psoriasis   . Scoliosis (and kyphoscoliosis), idiopathic   . GI bleed 12/10    cecam AVM   .  Gastritis 12/10  . Hyperlipidemia     takes Pravastatin daily  . Hypertension     takes Metoprolol daily  . Shortness of breath     with exertion  . Headache(784.0)     occasionally  . Joint pain   . Joint swelling   . Chronic back pain     scoliosis/stenosis/radiculopathy,degenerative disc disease  . Psoriasis   . Bruises easily   . Esophageal reflux     takes Omeprazole daily  . History of colonic polyps   . Hemorrhoids   . Kidney stone     hx of  . Blood transfusion 2010  . PMR (polymyalgia rheumatica) (H[facility]) 01/20/2012    tx by specialist on a long prednisone taper  . S/P PTCA (percutaneous transluminal coronary angioplasty)   . Rheumatoid arthritis(714.0)     takes Methotrexate 7pills weekly  . Degeneration of intervertebral disc, site unspecified   . Myocardial infarction ([place]) 2010  . Pneumonia 2008; 07/2015  . Type II or unspecified type diabetes mellitus without mention of complication, not stated as uncontrolled   . Skin cancer     "arms"   Past Surgical History:  Past Surgical History  Procedure Laterality Date  . Rotator cuff repair Left 2000  . Colonoscopy    . Vasectomy  1979  . Coronary angioplasty with stent placement  02/2009    has one stent  . Carotid doppler  10/12    0-39% R and 60-79% left   . Cataract extraction  2012  . Cardiac catheterization  2010  . Lithotripsy  2009  . Tee without cardioversion N/A 08/06/2015    Procedure: TRANSESOPHAGEAL ECHOCARDIOGRAM (TEE); Surgeon: Skeet Latch, MD; Location: [place]; Service: Cardiovascular; Laterality: N/A;  . Cardioversion N/A 08/06/2015    Procedure: CARDIOVERSION; Surgeon: Skeet Latch, MD; Location: [facility] ENDOSCOPY; Service: Cardiovascular; Laterality: N/A;  . Back surgery    . Lumbar laminectomy  07/2015  .  Posterior laminectomy thoracic spine      "fixed the discs"  . Skin cancer excision      "arms"    Assessment & Plan Clinical Impression: Curtis Frazier is a 75 y.o. male with history of CAD, psoriatic arthropathy, DMT2, RA, lumbar stenosis with outpatient lumbar discectomy on 07/15/15 who was admitted on 02/4 with sepsis due to PNA, mental status changes and A fib with RVR. hospital course complicated by VDRF, acute on chronic CHF, cardioversion for management of A fib, hypoxia requiring face mask as well as confusion with encephalopathy. He was transfered to [facility] on 02/21 but re-admitted to acute hospital on 02/28 with ongoing encephalopathy, worsening of lethargy, poor po intake and acute on chronic renal failure with hypernatremia. He was started on IV antibiotics as well as IVF due to hypotension/sepsis. Neurology consulted for input on confusion and asterixis and recommended MRI brain as well as EEG for workup. EEG done revealing mild slowing but no seizures. MRI of brain done revealing atrophy and small vessel disease with chronic and question acute right maxillary disease. Encephalopathy felt to be due to metabolic derangement. BP medications held due to orthostatic changes, acute renal failure has resolved and mentation is slowly improving. Follow up CXR 3/3 shows no edema or infiltrate and he has been weaned off oxygen. FEES done 3/2 and dysphagia 1, honey liquids by spoon recommended for moderate pharyngeal dysphagia as well a pharyngeal strengthening exercises. Back wound dehiscence noted on 03/06 and Dr. Arnoldo Morale recommends daily dressing changes with follow up in 2 weeks.  Patient transferred to CIR on 08/17/2015.   Met with pt to discuss TR services and use of leisure time.  Education on activity analysis with potential leisure modifications.  Pt stated understanding and motivation to stay as active as he could.  Pt able to identify potential leisure resources once home and plans  to investigate those opportunities.  No further TR as pt is scheduled for discharge home 09/02/15.       Leisure History/Participation Premorbid leisure interest/current participation: Medical laboratory scientific officer - Building control surveyor - Doctor, hospital - Travel (Comment);Joretta Bachelor care;Petra Kuba - Vegetable gardening Other Leisure Interests: Television;Housework Leisure Participation Style: With Family/Friends Awareness of Community Resources: Excellent Psychosocial / Spiritual Spiritual Interests: Church;Womens'[employer]'s Groups Patient agreeable to Pet Therapy: Yes Does patient have pets?: Yes (outdoor cat) Social interaction - Mood/Behavior: Cooperative Academic librarian Appropriate for Education?: Yes Recreational Therapy Orientation Orientation -Reviewed with patient: Available activity resources Strengths/Weaknesses Patient Strengths/Abilities: Willingness to participate;Active premorbidly Patient weaknesses: Physical limitations TR Patient demonstrates impairments in the following area(s): Endurance;Motor;Safety  Plan Rec Therapy Plan Is patient appropriate for Therapeutic Recreation?: Yes Rehab Potential: Excellent Estimated Length of Stay: 2 weeks   The above assessment, treatment plan, treatment alternatives and goals were discussed and mutually agreed upon: by patient  [place] 08/24/2015, 12:17 PM

## 2015-08-24 NOTE — Progress Notes (Signed)
Social Work Patient ID: Curtis Frazier, male   DOB: 09-11-1940, 75 y.o.   MRN: [id number]   Curtis Curb, LCSW Social Worker Signed  Patient Care Conference 08/24/2015  1:18 PM    Expand All Collapse All   Inpatient RehabilitationTeam Conference and Plan of Care Update Date: 08/24/2015   Time: 10:35 AM     Patient Name: Curtis Frazier      Medical Record Number: [id number]  Date of Birth: 08-Nov-1940 Sex: Male         Room/Bed: 4W09C/4W09C-01 Payor Info: Payor: MEDICARE / Plan: MEDICARE PART A AND B / Product Type: *No Product type* /    Admitting Diagnosis: ENCEPH DECONDITION   Admit Date/Time:  08/17/2015  5:36 PM Admission Comments: No comment available   Primary Diagnosis:  <principal problem not specified> Principal Problem: <principal problem not specified>    Patient Active Problem List     Diagnosis  Date Noted   .  Subtherapeutic international normalized ratio (INR)     .  Supratherapeutic INR     .  Labile blood glucose     .  Cough     .  AKI (acute kidney injury) ([place])     .  Encephalopathy, metabolic  [id number]   .  Debility     .  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  Dysphagia     .  Spondylosis of lumbar region without myelopathy or radiculopathy     .  Thrush  08/12/2015   .  Urinary retention  08/12/2015   .  Hypotension  08/11/2015   .  Hypernatremia  08/10/2015   .  Encephalopathy  08/10/2015   .  Acute on chronic renal failure ([place])  08/10/2015   .  Chronic diastolic congestive heart failure ([place])  08/10/2015   .  Persistent atrial fibrillation ([place])     .  Acute diastolic congestive heart failure ([place])     .  Dyspnea     .  Disorientation     .  Slow transit constipation     .  Back pain without radiation     .  Counseling regarding advanced care planning and goals of care     .  PAF (paroxysmal atrial fibrillation) ([place])  07/29/2015   .  Advanced care planning/counseling  discussion     .  Pneumonia     .  Change in mental state     .  Endotracheally intubated     .  CHF (congestive heart failure) ([place])     .  Palliative care encounter  07/21/2015   .  DNR (do not resuscitate) discussion  07/21/2015   .  HCAP (healthcare-associated pneumonia)     .  Acute respiratory failure ([place])     .  Acute respiratory failure with hypoxia ([place])  07/18/2015   .  Atrial fibrillation with RVR ([place])  07/18/2015   .  Acute pulmonary edema ([place])  07/18/2015   .  Sepsis ([place])  07/17/2015   .  Acute encephalopathy  07/17/2015   .  LLL pneumonia  07/17/2015   .  Type II or unspecified type diabetes mellitus without mention of complication, not stated as uncontrolled  07/17/2015   .  S/P lumbar laminectomy  07/17/2015   .  Elevated troponin  07/17/2015   .  Type 2 diabetes mellitus without  complication, without long-term current use of insulin ([place])     .  Acute bronchitis  03/05/2015   .  Back pain, thoracic  01/15/2015   .  Chest wall pain  01/15/2015   .  Routine general medical examination at a health care facility  11/23/2014   .  Productive cough  10/14/2014   .  Left flank pain  09/25/2014   .  Renal insufficiency  11/14/2013   .  Encounter for Medicare annual wellness exam  07/07/2013   .  BPH (benign prostatic hyperplasia)  07/05/2012   .  Prostate cancer screening  06/27/2012   .  Low TSH level  01/22/2012   .  PMR (polymyalgia rheumatica) ([facility])  01/20/2012   .  Aortic stenosis  07/08/2011   .  New onset a-fib ([place])  07/05/2011       Class: Acute   .  HYPERKALEMIA  02/18/2010   .  Essential hypertension, benign  09/13/2009   .  ANEMIA-UNSPECIFIED  06/10/2009   .  ANGIODYSPLASIA-INTESTINE  06/10/2009   .  MURMUR  05/21/2009   .  Left carotid bruit  05/21/2009   .  Coronary atherosclerosis  03/09/2009   .  GERD  03/09/2009   .  Rheumatoid arthritis ([place])  04/29/2008   .  HX, PERSONAL, COLONIC POLYPS  01/17/2007   .  Diabetes mellitus type 2, controlled,  without complications ([place])  [id number]   .  Elevated lipids  09/18/2006   .  PSORIATIC ARTHRITIS  09/18/2006   .  PSORIASIS  09/18/2006   .  ACTINIC KERATOSIS  09/18/2006   .  DEGENERATIVE DISC DISEASE  09/18/2006   .  SCOLIOSIS  09/18/2006   .  TOBACCO ABUSE, HX OF  09/18/2006     Expected Discharge Date: Expected Discharge Date: 09/02/15  Team Members Present: Physician leading conference: Dr. Delice Lesch Social Worker Present: Lennart Pall, LCSW Nurse Present: Dorien Chihuahua, RN PT Present: Carney Living, PT OT Present: Roanna Epley, [place], OT SLP Present: Weston Anna, SLP PPS Coordinator present : Daiva Nakayama, RN, CRRN        Current Status/Progress  Goal  Weekly Team Focus   Medical     Debilitation and cognitive deficits secondary to encephalopathy and multiple medical complications after lumbar discectomy   Improve orthostasis, CBGs, AKI  see above   Bowel/Bladder     Continent of bowel and bladder. LBM 08/23/15   Pt to remain continent of bowel and bladder   Monitor   Swallow/Nutrition/ Hydration     Dys. 1 textures with honey-thick liquids via tsp, Mod A for use of swallow strategies   Min A with least restrictive diet  repeat MBS tomorrow, increase use of swallowing strategies    ADL's     min A/supervision overall; decreased activity tolerance;   mod I overall  activity tolerance, standing balance, functional transfers, family education   Mobility     S-min A, significantly decreased activity tolerance   mod I overall, supervision stairs and car  standing balance, functional mobility training, activity tolerance, pt/family education   Communication               Safety/Cognition/ Behavioral Observations    Mod A  Min A   functional problem solving, recall and attention    Pain     No c/o pain  <3  Monitor for nonverbal cues of pain    Skin     Stage 2 w/  Allevyn dressing to area   No additional skin breakdown  Monitor    Rehab Goals Patient on  target to meet rehab goals: Yes Rehab Goals Revised: On track to meet supervision level goals. *See Care Plan and progress notes for long and short-term goals.    Barriers to Discharge:  Orthostasis, AKI, DM, IVF     Possible Resolutions to Barriers:   TEDS, monitor labs, IVF recently d/ced, transition to home DM meds     Discharge Planning/Teaching Needs:   Home with wife and family arranging 24/7 assist/ supervision.  to be scheduled prior to d/c    Team Discussion:    Still watching INR;  IVF stopped.  Easily becomes out of breath and fatigued;  Ted hose added yesterday due to LE swelling.  Poor endurance and very poor memory which combine to affect his safety and anticipate will need supervision.  Plan to repeat MBS tomorrow (pt has been a silent aspirator).  Stage 2 sacrum & open area at base of incision - MD aware.     Revisions to Treatment Plan:    None    Continued Need for Acute Rehabilitation Level of Care: The patient requires daily medical management by a physician with specialized training in physical medicine and rehabilitation for the following conditions: Daily direction of a multidisciplinary physical rehabilitation program to ensure safe treatment while eliciting the highest outcome that is of practical value to the patient.: Yes Daily medical management of patient stability for increased activity during participation in an intensive rehabilitation regime.: Yes Daily analysis of laboratory values and/or radiology reports with any subsequent need for medication adjustment of medical intervention for : Pulmonary problems;Post surgical problems;Diabetes problems  Sharlot Sturkey 08/24/2015, 1:18 PM

## 2015-08-24 NOTE — Progress Notes (Signed)
Occupational Therapy Session Note  Patient Details  Name: Curtis Frazier MRN: [id number] Date of Birth: 10/25/1940  Today's Date: 08/24/2015 OT Individual Time: 1100-1200 OT Individual Time Calculation (min): 60 min    Short Term Goals: Week 1:  OT Short Term Goal 1 (Week 1): Pt will be able to tolerate standing for at least 2 minutes without changes in vital signs to be more independent with toileting. OT Short Term Goal 2 (Week 1): Pt will complete toileting with steady A. OT Short Term Goal 3 (Week 1): Pt will use AE to don LB clothing with min A. OT Short Term Goal 4 (Week 1): Pt will use long sponge to wash feet. OT Short Term Goal 5 (Week 1): Pt will complete smooth stand to sit with steadying A.  Skilled Therapeutic Interventions/Progress Updates:    Pt resting in w/c upon arrival.  Pt engaged in BADL retraining including bathing at shower level and dressing with sit<>stand from w/c at sink.  Pt requires more than a reasonable amount of time with multiple rest breaks throughout session.  Pt noted with SOB after minimal exertion requiring rest break.  Pt performed sit<>stand with close supervision and steady A for standing balance to bathe buttocks.  Focus on activity tolerance, sit<>stand, standing balance, functional transfers, and safety awareness to increase independence with BADLs.  Therapy Documentation Precautions:  Precautions Precautions: Fall, Back Precaution Comments: Recent spine surgery 2/2 Restrictions Weight Bearing Restrictions: No Pain:  Pt denies pain ADL: ADL ADL Comments: Refer to functional navigator   See Function Navigator for Current Functional Status.   Therapy/Group: Individual Therapy  Leroy Libman 08/24/2015, 12:05 PM

## 2015-08-24 NOTE — Progress Notes (Signed)
[facility] PHYSICAL MEDICINE & REHABILITATION     PROGRESS NOTE  Subjective/Complaints: Patient seen sleeping in bed. He slept well overnight. He believes he is making good progress in therapies.   ROS: Denies CP, SOB, N/V/D.  Objective: Vital Signs: Blood pressure 110/64, pulse 96, temperature 98.6 F (37 C), temperature source Oral, resp. rate 18, weight 77.7 kg (171 lb 4.8 oz), SpO2 96 %. No results found.  Recent Labs  08/23/15 0541  WBC 8.9  HGB 9.6*  HCT 31.0*  PLT 245    Recent Labs  08/23/15 0541 08/24/15 0500  NA 139 141  K 4.4 4.2  CL 108 107  GLUCOSE 99 108*  BUN 10 9  CREATININE 1.19 1.17  CALCIUM 7.8* 8.1*   CBG (last 3)   Recent Labs  08/23/15 1638 08/23/15 2048 08/24/15 0637  GLUCAP 179* 186* 105*    Wt Readings from Last 3 Encounters:  08/24/15 77.7 kg (171 lb 4.8 oz)  08/17/15 75.3 kg (166 lb 0.1 oz)  08/03/15 80.1 kg (176 lb 9.4 oz)    Physical Exam:  Constitutional: He appears well-developed and well-nourished. No acute distress. Vital signs reviewed HENT: Normocephalic. Atraumatic Eyes: Conjunctivae and EOM are normal.  Neck: Normal range of motion. Neck supple.  Cardiovascular: Normal rate. An irregularly irregular rhythm present.  Respiratory: Effort normal and breath sounds normal. No respiratory distress. He has no wheezes. He exhibits no tenderness.  GI: He exhibits no distension. There is no tenderness.  Musculoskeletal: He exhibits no edema or tenderness.  Neurological: He is alert and oriented 3.  B/l UE 5/5 prox to distal.  B/l LE: 4 -/5 hip flexion, knee extension 4 -/5 ankle dorsi/plantarflexion Skin: Skin is warm and dry. He is not diaphoretic.  Dry  skin.  Back incision C/D/I Intention tremors noted  Psychiatric: He has a normal mood and affect. His behavior is normal.   Assessment/Plan: 1. 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 Physiatrist is providing close team supervision and 24 hour management of active medical problems listed below. Physiatrist and rehab team continue to assess barriers to discharge/monitor patient progress toward functional and medical goals.  Function:  Bathing Bathing position Bathing activity did not occur: Refused Position: Wheelchair/chair at sink  Bathing parts Body parts bathed by patient: Right arm, Left arm, Chest, Abdomen, Front perineal area, Buttocks, Right upper leg, Left upper leg, Right lower leg, Left lower leg Body parts bathed by helper: Back  Bathing assist Assist Level: Touching or steadying assistance(Pt > 75%)      Upper Body Dressing/Undressing Upper body dressing   What is the patient wearing?: Pull over shirt/dress     Pull over shirt/dress - Perfomed by patient: Thread/unthread right sleeve, Thread/unthread left sleeve, Put head through opening, Pull shirt over trunk          Upper body assist Assist Level: More than reasonable time   Set up : To obtain clothing/put away  Lower Body Dressing/Undressing Lower body dressing   What is the patient wearing?: Underwear, Pants, Socks, Shoes Underwear - Performed by patient: Thread/unthread right underwear leg, Pull underwear up/down, Thread/unthread left underwear leg Underwear - Performed by helper: Thread/unthread right underwear leg, Thread/unthread left underwear leg Pants- Performed by patient: Thread/unthread left pants leg, Pull pants up/down, Thread/unthread right pants leg Pants- Performed by helper: Thread/unthread right pants leg   Non-skid slipper socks- Performed  by helper: Don/doff right sock, Don/doff left sock Socks - Performed by patient: Don/doff right sock, Don/doff left sock   Shoes - Performed by patient: Don/doff left shoe Shoes - Performed by helper: Don/doff right shoe          Lower body assist Assist for lower  body dressing: Touching or steadying assistance (Pt > 75%)      Toileting Toileting     Toileting steps completed by helper: Adjust clothing prior to toileting, Performs perineal hygiene, Adjust clothing after toileting Toileting Assistive Devices: Grab bar or rail  Toileting assist Assist level: Touching or steadying assistance (Pt.75%), Two helpers   Transfers Chair/bed transfer   Chair/bed transfer method: Stand pivot Chair/bed transfer assist level: Touching or steadying assistance (Pt > 75%) Chair/bed transfer assistive device: Armrests, Medical sales representative     Max distance: 53 Assist level: Touching or steadying assistance (Pt > 75%)   Wheelchair   Type: Manual Max wheelchair distance: 200 Assist Level: Supervision or verbal cues  Cognition Comprehension Comprehension assist level: Understands basic 75 - 89% of the time/ requires cueing 10 - 24% of the time  Expression Expression assist level: Expresses basic 90% of the time/requires cueing < 10% of the time.  Social Interaction Social Interaction assist level: Interacts appropriately 90% of the time - Needs monitoring or encouragement for participation or interaction.  Problem Solving Problem solving assist level: Solves basic 50 - 74% of the time/requires cueing 25 - 49% of the time  Memory Memory assist level: Recognizes or recalls 50 - 74% of the time/requires cueing 25 - 49% of the time   Medical Problem List and Plan: 1. 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 DVT Prophylaxis/Anticoagulation: Pharmaceutical: Coumadin. Appreciate pharmacy assist.   INR is Subtherapeutic, appreciate pharmacy recs 3. Chronic back pain/Pain Management: tylenol only at present 4. Mood: LCSW to follow for evaluation and support.  5. Neuropsych: This patient is not yet capable of making decisions on his own  behalf. 6. Skin/Wound Care: Dry dressing daily. Monitor wound for healing. Added protein supplement to help with low protein stores 7. Fluids/Electrolytes/Nutrition: Continue IVF at night to maintain adequate hydration status.  8. Acute renal failure: Cr sl elevated. Continue to encourage po honey thick liquids.   -Cr 1.7 on 3/14. Continue check serially  -IVF DC'd, will continue to monitor 9. A fib with RVR: Monitor HR bid. On amiodarone and coumadin --monitor for any symptoms with activity.  10. PMR/RA: Has completed stress dose steroids--now on maintaince dose.  11. Anemia of chronic illness/iron deficiency: On iron supplement. hgb 11.2 12. GERD: protonix 13. DMT2: Currently on Lantus 10 units at bedtime but used glucotrol 5 mg daily at home .   Monitor BS ac/hs with use SSI for now.   Transition to oral hypoglycemic when intake is more consistent.   Fasting CBG 105 this AM,   Will continue to monitor.   Eating 50-100% of meals.  Lantus DC'd yesterday, however CBGs in fairly good control, we'll consider starting oral hypo-glycemic tomorrow 14. CAD: Off ASA/plavix-- now on coumadin and lipitor.  15. Dysphagia: Dysphagia 1, honey liquids by tsp. Needs full supervision at meals. IVF at nights DC'd  16. Cough: no signs this morning  -CXR with ?atelectasis left base  -encourage IS, OOB.   LOS (Days) 7 A FACE TO FACE EVALUATION WAS PERFORMED  Gayatri Teasdale Lorie Phenix 08/24/2015 8:57 AM

## 2015-08-24 NOTE — Progress Notes (Signed)
Social Work Patient ID: Curtis Frazier, male   DOB: 04/05/1941, 75 y.o.   MRN: [id number]   Met with pt and daughter today to review team conference.  Both aware and agreeable with targeted d/c date of 3/23 with supervision goals overall.  Daughter very concerned that pt's wife is not expected to be able to provide supervision and reports that she will speak with her siblings.  Family is here daily and hope they can coordinate needed care for home.  Both understand that MBS planned for tomorrow and very hopeful he can upgrade liquids.  Daughter asking if "they said anything about the numbness in his feet."  This was not discussed in team conference.  Family reports pt has been reporting numbness  since prior to CIR admit - discussed with Pamela Love, PA who is aware.  Will follow up with daughter/ family by end of the week to discuss d/c planning.  , , LCSW  

## 2015-08-25 ENCOUNTER — Inpatient Hospital Stay (HOSPITAL_COMMUNITY): Payer: Medicare Other | Admitting: *Deleted

## 2015-08-25 ENCOUNTER — Inpatient Hospital Stay (HOSPITAL_COMMUNITY): Payer: Medicare Other

## 2015-08-25 ENCOUNTER — Inpatient Hospital Stay (HOSPITAL_COMMUNITY): Payer: Medicare Other | Admitting: Speech Pathology

## 2015-08-25 LAB — PROTIME-INR
INR: 1.43 (ref 0.00–1.49)
Prothrombin Time: 17.5 seconds — ABNORMAL HIGH (ref 11.6–15.2)

## 2015-08-25 LAB — GLUCOSE, CAPILLARY
GLUCOSE-CAPILLARY: 134 mg/dL — AB (ref 65–99)
GLUCOSE-CAPILLARY: 166 mg/dL — AB (ref 65–99)
Glucose-Capillary: 125 mg/dL — ABNORMAL HIGH (ref 65–99)
Glucose-Capillary: 150 mg/dL — ABNORMAL HIGH (ref 65–99)

## 2015-08-25 MED ORDER — WARFARIN SODIUM 5 MG PO TABS
5.0000 mg | ORAL_TABLET | Freq: Once | ORAL | Status: AC
  Administered 2015-08-25: 5 mg via ORAL
  Filled 2015-08-25: qty 1

## 2015-08-25 NOTE — Progress Notes (Signed)
Physical Therapy Session Note  Patient Details  Name: Curtis Frazier MRN: [id number] Date of Birth: 1940/11/15  Today's Date: 08/25/2015 PT Individual Time: 1100-1200 PT Individual Time Calculation (min): 60 min   Short Term Goals: Week 1:  PT Short Term Goal 1 (Week 1): Patient will perform transfers with supervision. PT Short Term Goal 2 (Week 1): Patient will ambulate 50 ft using LRAD with supervision. PT Short Term Goal 3 (Week 1): Patient will negotiate up/down 1 step with 1 rail with min A.  PT Short Term Goal 4 (Week 1): Patient will maintain standing balance x 1 min with min A.   Skilled Therapeutic Interventions/Progress Updates:   Patient received sitting in wheelchair with daughter [name] present for session. RT present for first half co-treat to address standing balance, strengthening, and endurance. Patient propelled wheelchair using BUE x 200 ft with supervision with good speed and 1 rest break. Patient stood without UE support to kick ball back to RT using both feet with min A overall for balance and decreased ability to lift LLE off ground to kick ball, approx 1 min each trial before requiring seated rest break x 5 trials. Gait training using RW x 50 ft with supervision and cues for safe use of RW, RW height adjusted to improve safety with keeping RW closer to body. Stair training up/down 4 (6") stairs x 2 with step-to pattern, ascending leading with RLE x 1 and ascending leading with LLE x 1 for strengthening, min A overall. Provided handout to daughter for building ramp as patient's family plans to build ramp for 1-step entry through garage. Worked on standing balance and weight shifts on [employer] limits of stability training with supervision-min A for weight shifting to target and seated rest breaks as needed. Patient left sitting in wheelchair to return to room with daughter.   Therapy Documentation Precautions:  Precautions Precautions: Fall, Back Precaution Comments: Recent spine  surgery 2/2 Restrictions Weight Bearing Restrictions: No  Pain: Pain Assessment Pain Assessment: No/denies pain   See Function Navigator for Current Functional Status.   Therapy/Group: Individual Therapy  [name] 08/25/2015, 12:15 PM

## 2015-08-25 NOTE — Progress Notes (Signed)
Speech Language Pathology Weekly Progress and Session Note  Patient Details  Name: Curtis Frazier MRN: [id number] Date of Birth: 07/13/1940  Beginning of progress report period: August 18, 2015 End of progress report period: August 25, 2015  Today's Date: 08/25/2015 SLP Individual Time: 1345-1415 SLP Individual Time Calculation (min): 30 min  Short Term Goals: Week 1: SLP Short Term Goal 1 (Week 1): Patient will demonstrate selective attention to a functional task in a mildly distracting enviornment for 30 minutes with Min A verbal cues for redirection.  SLP Short Term Goal 1 - Progress (Week 1): Met SLP Short Term Goal 2 (Week 1): Patient will utilize external memory aids to recall new, daily information with Min A verbal and questioning cues.  SLP Short Term Goal 2 - Progress (Week 1): Met SLP Short Term Goal 3 (Week 1): Patient will consume current diet with minimal overt s/s of aspiration with Mod A verbal cues for use of swallowing compensatory strategies.  SLP Short Term Goal 3 - Progress (Week 1): Met SLP Short Term Goal 4 (Week 1): Patient will demonstrate efficient mastication with trials of Dys. 2 textures demonstrated by minimal oral residue over 2 consecutive sessions prior to upgrade.  SLP Short Term Goal 4 - Progress (Week 1): Met SLP Short Term Goal 5 (Week 1): Patient will consume trials of nectar-thick liquids via tsp with minimal overt s/s of aspiration with Min A verbal cues for use of swallowing comepnsatory strategies over 2 consecutive sesions prior to discharge.  SLP Short Term Goal 5 - Progress (Week 1): Met SLP Short Term Goal 6 (Week 1): Patient will perform pharyngeal strengthening exercises with supervision verbal cues for accuracy.  SLP Short Term Goal 6 - Progress (Week 1): Met    New Short Term Goals: Week 2: SLP Short Term Goal 1 (Week 2): Patient will consume current diet with minimal overt s/s of aspiration with Min A verbal cues for use of swallowing  compensatory strategies.  SLP Short Term Goal 2 (Week 2): Patient will perform pharyngeal strengthening exercises with Mod I for accuracy.  SLP Short Term Goal 3 (Week 2): Patient will utilize external memory aids to recall new, daily information with supervision verbal and questioning cues.  SLP Short Term Goal 4 (Week 2): Patient will demonstrate selective attention to a functional task in a mildly distracting enviornment for 60 minutes with supervision  verbal cues for redirection.   Weekly Progress Updates: Patient has made excellent gains and has met 6 of 6 STG's this reporting period due to improved swallowing and cognitive function. Patient had repeat MBS today and is currently consuming regular textures with thin liquids with overall Mod A verbal cues needed for use of swallowing compensatory strategies. Patient also requires overall min A verbal and question cues to complete functional and familiar tasks safely in regards to selective attention and recall of daily information. Patient and family education is ongoing. Patient would benefit from continued skilled SLP intervention to maximize cognitive and swallowing function and overall functional independence prior to discharge home.     Intensity: Minumum of 1-2 x/day, 30 to 90 minutes Frequency: 3 to 5 out of 7 days Duration/Length of Stay: 09/02/15 Treatment/Interventions: Cognitive remediation/compensation;Cueing hierarchy;Dysphagia/aspiration precaution training;Functional tasks;Patient/family education;Therapeutic Activities;Speech/Language facilitation;Therapeutic Exercise;Internal/external aids;Environmental controls   Daily Session  Skilled Therapeutic Interventions: Skilled treatment session focused on dysphagia goals. SLP facilitated session by initially providing Mod A verbal cues which faded to supervision by end of session for use of chin tuck   and an intermittent cough with thin liquids via cup. Patient consumed trials without  overt s/s of aspiration. Patient also required supervision verbal cues to perform pharyngeal strengthening exercises accurately. Patient requested to transfer back to bed at end of session and completed task with supervision verbal cues for safety. Patient left supine in bed with all needs within reach and alarm on. Continue with current plan of care.     Function:   Eating Eating   Modified Consistency Diet: Yes Eating Assist Level: Supervision or verbal cues;More than reasonable amount of time           Cognition Comprehension Comprehension assist level: Understands basic 90% of the time/cues < 10% of the time  Expression   Expression assist level: Expresses basic 90% of the time/requires cueing < 10% of the time.  Social Interaction Social Interaction assist level: Interacts appropriately 90% of the time - Needs monitoring or encouragement for participation or interaction.  Problem Solving Problem solving assist level: Solves basic 50 - 74% of the time/requires cueing 25 - 49% of the time  Memory Memory assist level: Recognizes or recalls 50 - 74% of the time/requires cueing 25 - 49% of the time   Pain No/Denies Pain   Therapy/Group: Individual Therapy  ,  08/25/2015, 4:41 PM         

## 2015-08-25 NOTE — Progress Notes (Signed)
[facility]     PROGRESS NOTE  Subjective/Complaints: Patient seen sleeping in bed. He slept well overnight. He believes he is making good progress in therapies.   ROS: Denies CP, SOB, N/V/D.  Objective: Vital Signs: Blood pressure 105/49, pulse 92, temperature 98.3 F (36.8 C), temperature source Oral, resp. rate 18, weight 78.608 kg (173 lb 4.8 oz), SpO2 98 %. No results found.  Recent Labs  08/23/15 0541  WBC 8.9  HGB 9.6*  HCT 31.0*  PLT 245    Recent Labs  08/23/15 0541 08/24/15 0500  NA 139 141  K 4.4 4.2  CL 108 107  GLUCOSE 99 108*  BUN 10 9  CREATININE 1.19 1.17  CALCIUM 7.8* 8.1*   CBG (last 3)   Recent Labs  08/24/15 1636 08/24/15 2149 08/25/15 0644  GLUCAP 207* 237* 125*    Wt Readings from Last 3 Encounters:  08/25/15 78.608 kg (173 lb 4.8 oz)  08/17/15 75.3 kg (166 lb 0.1 oz)  08/03/15 80.1 kg (176 lb 9.4 oz)    Physical Exam:  Constitutional: He appears well-developed and well-nourished. No acute distress. Vital signs reviewed HENT: Normocephalic. Atraumatic Eyes: Conjunctivae and EOM are normal.  Neck: Normal range of motion. Neck supple.  Cardiovascular: Normal rate. An irregularly irregular rhythm present.  Respiratory: Effort normal and breath sounds normal. No respiratory distress. He has no wheezes. He exhibits no tenderness.  GI: He exhibits no distension. There is no tenderness.  Musculoskeletal: He exhibits no edema or tenderness.  Neurological: He is alert and oriented 3.  B/l UE 5/5 prox to distal.  B/l LE: 4 -/5 hip flexion, knee extension 4 -/5 ankle dorsi/plantarflexion Skin: Skin is warm and dry. He is not diaphoretic.  Dry  skin.  Back incision C/D/I Intention tremors noted  Psychiatric: He has a normal mood and affect. His behavior is normal.   Assessment/Plan: 1. 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 Physiatrist is providing close team supervision and 24 hour management of active medical problems listed below. Physiatrist and rehab team continue to assess barriers to discharge/monitor patient progress toward functional and medical goals.  Function:  Bathing Bathing position Bathing activity did not occur: Refused Position: Wheelchair/chair at sink  Bathing parts Body parts bathed by patient: Right arm, Left arm, Chest, Abdomen, Front perineal area, Buttocks, Right upper leg, Left upper leg, Right lower leg, Left lower leg Body parts bathed by helper: Back  Bathing assist Assist Level: Touching or steadying assistance(Pt > 75%)      Upper Body Dressing/Undressing Upper body dressing   What is the patient wearing?: Pull over shirt/dress     Pull over shirt/dress - Perfomed by patient: Thread/unthread right sleeve, Thread/unthread left sleeve, Put head through opening, Pull shirt over trunk          Upper body assist Assist Level: Set up   Set up : To obtain clothing/put away  Lower Body Dressing/Undressing Lower body dressing   What is the patient wearing?: Underwear, Pants, Shoes, [employer]  - Performed by patient: Thread/unthread right underwear leg, Pull underwear up/down, Thread/unthread left underwear leg Underwear - Performed by helper: Thread/unthread right underwear leg, Thread/unthread left underwear leg Pants- Performed by patient: Thread/unthread left pants leg, Pull pants up/down, Thread/unthread right pants leg Pants- Performed by helper: Thread/unthread right pants leg Non-skid slipper socks- Performed by patient: Don/doff  right sock, Don/doff left sock Non-skid slipper socks- Performed by helper: Don/doff right sock, Don/doff left sock Socks - Performed by patient: Don/doff right sock, Don/doff left sock   Shoes - Performed by patient: Don/doff right shoe, Don/doff left shoe Shoes -  Performed by helper: Don/doff right shoe       TED Hose - Performed by helper: Don/doff right TED hose, Don/doff left TED hose  Lower body assist Assist for lower body dressing: Touching or steadying assistance (Pt > 75%)      Toileting Toileting     Toileting steps completed by helper: Adjust clothing prior to toileting, Performs perineal hygiene, Adjust clothing after toileting Toileting Assistive Devices: Grab bar or rail  Toileting assist Assist level: Touching or steadying assistance (Pt.75%), Two helpers   Transfers Chair/bed transfer   Chair/bed transfer method: Stand pivot Chair/bed transfer assist level: Touching or steadying assistance (Pt > 75%) Chair/bed transfer assistive device: Armrests, Medical sales representative     Max distance: 70 Assist level: Touching or steadying assistance (Pt > 75%)   Wheelchair   Type: Manual Max wheelchair distance: 200 Assist Level: Supervision or verbal cues  Cognition Comprehension Comprehension assist level: Understands complex 90% of the time/cues 10% of the time  Expression Expression assist level: Expresses complex 90% of the time/cues < 10% of the time  Social Interaction Social Interaction assist level: Interacts appropriately 75 - 89% of the time - Needs redirection for appropriate language or to initiate interaction.  Problem Solving Problem solving assist level: Solves basic 50 - 74% of the time/requires cueing 25 - 49% of the time  Memory Memory assist level: Recognizes or recalls 50 - 74% of the time/requires cueing 25 - 49% of the time   Medical Problem List and Plan: 1. 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 DVT Prophylaxis/Anticoagulation: Pharmaceutical: Coumadin. Appreciate pharmacy assist.    3. Chronic back pain/Pain Management: tylenol only at present 4. Mood: LCSW to follow for evaluation  and support.  5. Neuropsych: This patient is not yet capable of making decisions on his own behalf. 6. Skin/Wound Care: Dry dressing daily. Monitor wound for healing. Added protein supplement to help with low protein stores 7. Fluids/Electrolytes/Nutrition: Continue IVF at night to maintain adequate hydration status.  8. Acute renal failure: Cr sl elevated. Continue to encourage po honey thick liquids.   -Cr 1.7 on 3/14. Continue check serially  -IVF DC'd, will check BMET in am 9. A fib with RVR: Monitor HR bid. On amiodarone and coumadin --monitor for any symptoms with activity.  10. PMR/RA: Has completed stress dose steroids--now on maintaince dose.  11. Anemia of chronic illness/iron deficiency: On iron supplement. hgb 11.2 12. GERD: protonix 13. DMT2: Currently on Lantus 10 units at bedtime but used glucotrol 5 mg daily at home .   Monitor BS ac/hs with use SSI for now.   Transition to oral hypoglycemic when intake is more consistent.   Fasting CBG 105 this AM,   Will continue to monitor.   Eating 50-100% of meals.  Lantus DC'd 3/13,  however CBGs in fairly good control,                                            14. CAD: Off ASA/plavix-- now on coumadin and lipitor.  15. Dysphagia: Dysphagia 1, honey liquids by tsp. Needs full supervision at meals.check BMET in am off IVF         16. Cough: no signs this morning  -CXR with ?atelectasis left base  -encourage IS, OOB.   LOS (Days) 8 A FACE TO FACE EVALUATION WAS PERFORMED  Alysia Penna E 08/25/2015 10:39 AM

## 2015-08-25 NOTE — Progress Notes (Signed)
ANTICOAGULATION CONSULT NOTE - Follow Up Consult  Pharmacy Consult for Coumadin Indication: atrial fibrillation  Allergies  Allergen Reactions  . Ace Inhibitors Other (See Comments)    Causes high K  . Angiotensin Receptor Blockers Other (See Comments)    Causes high K  . Metoprolol Other (See Comments)    May cause elevated K level  . Penicillins Swelling and Rash  . Tetracycline Swelling and Rash    Patient Measurements: Weight: 173 lb 4.8 oz (78.608 kg) Heparin Dosing Weight:   Vital Signs: Temp: 98.3 F (36.8 C) (03/15 0648) Temp Source: Oral (03/15 0648)  Labs:  Recent Labs  08/23/15 0541 08/24/15 0500 08/25/15 0356  HGB 9.6*  --   --   HCT 31.0*  --   --   PLT 245  --   --   LABPROT 21.1* 20.9* 17.5*  INR 1.83* 1.81* 1.43  CREATININE 1.19 1.17  --     Estimated Creatinine Clearance: 52 mL/min (by C-G formula based on Cr of 1.17).   Assessment: 75yo male on Coumadin for AFib, Lovenox until INR therapeutic.  INR 1.81 > 1.43 this AM, no CBC today, no bleeding.  Pt on Amiodarone, which may increase Coumadin effect.  Goal of Therapy:  INR 2-3 Monitor platelets by anticoagulation protocol: Yes   Plan:  Repeat Coumadin 5mg  today Continue Lovenox Daily INR Watch for s/s of bleeding  Maryanna Shape, PharmD, BCPS  Clinical Pharmacist  Pager: [phone]

## 2015-08-25 NOTE — Progress Notes (Signed)
Speech Language Pathology Note  Patient Details  Name: Curtis Frazier MRN: [id number] Date of Birth: [birth year] Today's Date: 08/25/2015  MBSS complete. Full report located under chart review in imaging section.    [name] 08/25/2015, 4:34 PM

## 2015-08-25 NOTE — Progress Notes (Signed)
Occupational Therapy Session Note  Patient Details  Name: Curtis Frazier MRN: [id number] Date of Birth: Nov 09, 1940  Today's Date: 08/25/2015 OT Individual Time: [id number] Individual Time Calculation (min): 73 min    Short Term Goals: Week 1:  OT Short Term Goal 1 (Week 1): Pt will be able to tolerate standing for at least 2 minutes without changes in vital signs to be more independent with toileting. OT Short Term Goal 2 (Week 1): Pt will complete toileting with steady A. OT Short Term Goal 3 (Week 1): Pt will use AE to don LB clothing with min A. OT Short Term Goal 4 (Week 1): Pt will use long sponge to wash feet. OT Short Term Goal 5 (Week 1): Pt will complete smooth stand to sit with steadying A.  Skilled Therapeutic Interventions/Progress Updates:    Pt resting in w/c upon arrival eating breakfast.  Pt followed swallowing strategies with no verbal cues.  Pt elected to perform bathing tasks at w/c level this morning.  Pt engaged in BADL retraining including bathing and dressing with sit<>stand from w/c at sink.  Pt noted with SOB after approx 20 secs activity when standing and requiring rest break.  Pt performed all tasks with steady A when standing.  Pt propelled w/c to gym with rest breaks X 1 and engaged in dynamic standing tasks with rest breaks after approx 30 seconds activity.  Pt transitioned to [employer] and engaged in BUE therex (level work load 1 X 5 mins) with 3 rest breaks.  Pt returned to room and remained in w/c with all needs within reach.   Therapy Documentation Precautions:  Precautions Precautions: Fall, Back Precaution Comments: Recent spine surgery 2/2 Restrictions Weight Bearing Restrictions: No Pain:  Pt denied pain ADL: ADL ADL Comments: Refer to functional navigator   See Function Navigator for Current Functional Status.   Therapy/Group: Individual Therapy  Leroy Libman 08/25/2015, 8:14 AM

## 2015-08-26 ENCOUNTER — Inpatient Hospital Stay (HOSPITAL_COMMUNITY): Payer: Medicare Other | Admitting: Physical Therapy

## 2015-08-26 ENCOUNTER — Inpatient Hospital Stay (HOSPITAL_COMMUNITY): Payer: Medicare Other

## 2015-08-26 ENCOUNTER — Inpatient Hospital Stay (HOSPITAL_COMMUNITY): Payer: Medicare Other | Admitting: Speech Pathology

## 2015-08-26 LAB — PROTIME-INR
INR: 1.62 — ABNORMAL HIGH (ref 0.00–1.49)
PROTHROMBIN TIME: 19.3 s — AB (ref 11.6–15.2)

## 2015-08-26 LAB — GLUCOSE, CAPILLARY
GLUCOSE-CAPILLARY: 135 mg/dL — AB (ref 65–99)
GLUCOSE-CAPILLARY: 168 mg/dL — AB (ref 65–99)
GLUCOSE-CAPILLARY: 106 mg/dL — AB (ref 65–99)
Glucose-Capillary: 112 mg/dL — ABNORMAL HIGH (ref 65–99)

## 2015-08-26 MED ORDER — GLIPIZIDE 5 MG PO TABS
5.0000 mg | ORAL_TABLET | Freq: Every day | ORAL | Status: DC
  Administered 2015-08-26 – 2015-09-02 (×8): 5 mg via ORAL
  Filled 2015-08-26 (×8): qty 1

## 2015-08-26 MED ORDER — WARFARIN SODIUM 5 MG PO TABS
5.0000 mg | ORAL_TABLET | Freq: Once | ORAL | Status: AC
  Administered 2015-08-26: 5 mg via ORAL
  Filled 2015-08-26: qty 1

## 2015-08-26 NOTE — Progress Notes (Signed)
PHYSICAL MEDICINE & REHABILITATION     PROGRESS NOTE  Subjective/Complaints: Up eating breakfast. Feeling well. Denies pain. Good appetite.   ROS: Denies CP, SOB, N/V/D.  Objective: Vital Signs: Blood pressure 120/70, pulse 82, temperature 98.4 F (36.9 C), temperature source Oral, resp. rate 18, weight 77.2 kg (170 lb 3.1 oz), SpO2 93 %. Dg Swallowing Func-speech Pathology  08/25/2015  Objective Swallowing Evaluation: Type of Study: MBS-Modified Barium Swallow Study Patient Details Name: Curtis Frazier MRN: [id number] Date of Birth: Oct 31, 1940 Today's Date: 08/25/2015 Time: SLP Start Time (ACUTE ONLY): 0940-SLP Stop Time (ACUTE ONLY): 1015 SLP Time Calculation (min) (ACUTE ONLY): 35 min Past Medical History: Past Medical History Diagnosis Date . Other and unspecified hyperlipidemia  . Coronary atherosclerosis of unspecified type of vessel, native or graft  . Personal history of colonic polyps  . Actinic keratosis  . Psoriatic arthropathy ([place])  . Other psoriasis  . Scoliosis (and kyphoscoliosis), idiopathic  . GI bleed 12/10   cecam AVM . Gastritis 12/10 . Hyperlipidemia    takes Pravastatin daily . Hypertension    takes Metoprolol daily . Shortness of breath    with exertion . Headache(784.0)    occasionally . Joint pain  . Joint swelling  . Chronic back pain    scoliosis/stenosis/radiculopathy,degenerative disc disease . Psoriasis  . Bruises easily  . Esophageal reflux    takes Omeprazole daily . History of colonic polyps  . Hemorrhoids  . Kidney stone    hx of . Blood transfusion 2010 . PMR (polymyalgia rheumatica) (HCC) 01/20/2012   tx by specialist on a long prednisone taper . S/P PTCA (percutaneous transluminal coronary angioplasty)  . Rheumatoid arthritis(714.0)    takes Methotrexate 7pills weekly . Degeneration of intervertebral disc, site unspecified  . Myocardial infarction ([place]) 2010 . Pneumonia 2008; 07/2015 . Type II or unspecified type diabetes mellitus without mention of complication,  not stated as uncontrolled  . Skin cancer    "arms" Past Surgical History: Past Surgical History Procedure Laterality Date . Rotator cuff repair Left 2000 . Colonoscopy   . Vasectomy  1979 . Coronary angioplasty with stent placement  02/2009   has one stent . Carotid doppler  10/12   0-39% R and 60-79% left  . Cataract extraction  2012 . Cardiac catheterization  2010 . Lithotripsy  2009 . Tee without cardioversion N/A 08/06/2015   Procedure: TRANSESOPHAGEAL ECHOCARDIOGRAM (TEE);  Surgeon: Skeet Latch, MD;  Location: [place];  Service: Cardiovascular;  Laterality: N/A; . Cardioversion N/A 08/06/2015   Procedure: CARDIOVERSION;  Surgeon: Skeet Latch, MD;  Location[facility] ENDOSCOPY;  Service: Cardiovascular;  Laterality: N/A; . Back surgery   . Lumbar laminectomy  07/2015 . Posterior laminectomy thoracic spine     "fixed the discs" . Skin cancer excision     "arms" HPI: 75 year old male with history of hypertension, GERD, A. fib, sepsis related to pneumonia with related shock, respiratory failure requiring intubation, A. fib with RVR admitted from [facility] with AMS suspicious for infectious vs medication induced etiologies. FEES 3/2 with moderate oropharyngeal dysphagia with recommendations for dysphagia 1 textures with honey thick liquids via tsp. Patient admitted to [facility] 3/7 and has been participating in dysphagia treatment. MBS today to assess for possible diet upgrade.  Subjective: pt is eager for a cup of coffee Assessment / Plan / Recommendation CHL IP CLINICAL IMPRESSIONS 08/25/2015 Therapy Diagnosis Mild oral phase dysphagia;Mild pharyngeal phase dysphagia Clinical Impression Patient demonstrates a mild oropharyngeal dysphagia characterized by delayed  oral transit with piecemeal swallowing with delayed swallow initiation resulting in impaired timing of swallow and intermittent silent penetration of nectar-thick and thin liquids via cup. A cued cough was effective in expelling penetrates  from the laryngeal vestibule and a chin tuck was effective in eliminating penetration episodes. Recommend patient initiate a diet of regular textures with thin liquids with full supervision for utilization of swallowing compensatory strategies.  Patient and daughter verbalize understanding of all information.  Impact on safety and function Mild aspiration risk   CHL IP TREATMENT RECOMMENDATION 08/25/2015 Treatment Recommendations Therapy as outlined in treatment plan below   Prognosis 08/25/2015 Prognosis for Safe Diet Advancement Good Barriers to Reach Goals -- Barriers/Prognosis Comment -- CHL IP DIET RECOMMENDATION 08/25/2015 SLP Diet Recommendations Regular solids;Thin liquid Liquid Administration via Cup;No straw Medication Administration Crushed with puree Compensations Minimize environmental distractions;Slow rate;Small sips/bites;Multiple dry swallows after each bite/sip;Hard cough after swallow;Chin tuck Postural Changes Seated upright at 90 degrees   CHL IP OTHER RECOMMENDATIONS 08/25/2015 Recommended Consults -- Oral Care Recommendations Oral care BID Other Recommendations --   CHL IP FOLLOW UP RECOMMENDATIONS 08/25/2015 Follow up Recommendations Outpatient SLP;24 hour supervision/assistance   CHL IP FREQUENCY AND DURATION 08/25/2015 Speech Therapy Frequency (ACUTE ONLY) min 5x/week Treatment Duration 2 weeks      CHL IP ORAL PHASE 08/25/2015 Oral Phase Impaired Oral - Pudding Teaspoon -- Oral - Pudding Cup -- Oral - Honey Teaspoon Piecemeal swallowing;Delayed oral transit Oral - Honey Cup Piecemeal swallowing;Delayed oral transit Oral - Nectar Teaspoon Piecemeal swallowing;Delayed oral transit Oral - Nectar Cup Piecemeal swallowing;Delayed oral transit Oral - Nectar Straw -- Oral - Thin Teaspoon Piecemeal swallowing;Delayed oral transit Oral - Thin Cup Delayed oral transit;Piecemeal swallowing Oral - Thin Straw Delayed oral transit;Piecemeal swallowing Oral - Puree Delayed oral transit;Piecemeal swallowing Oral  - Mech Soft Delayed oral transit;Piecemeal swallowing Oral - Regular -- Oral - Multi-Consistency -- Oral - Pill -- Oral Phase - Comment --  CHL IP PHARYNGEAL PHASE 08/25/2015 Pharyngeal Phase Impaired Pharyngeal- Pudding Teaspoon -- Pharyngeal -- Pharyngeal- Pudding Cup -- Pharyngeal -- Pharyngeal- Honey Teaspoon Delayed swallow initiation-vallecula;Reduced tongue base retraction Pharyngeal Material does not enter airway Pharyngeal- Honey Cup Delayed swallow initiation-vallecula;Pharyngeal residue - valleculae Pharyngeal Material does not enter airway Pharyngeal- Nectar Teaspoon Delayed swallow initiation-vallecula;Pharyngeal residue - valleculae Pharyngeal Material does not enter airway Pharyngeal- Nectar Cup Compensatory strategies attempted (with notebox);Penetration/Aspiration during swallow;Delayed swallow initiation-vallecula;Pharyngeal residue - valleculae Pharyngeal Material enters airway, remains ABOVE vocal cords and not ejected out Pharyngeal- Nectar Straw -- Pharyngeal -- Pharyngeal- Thin Teaspoon Delayed swallow initiation-vallecula;Pharyngeal residue - valleculae Pharyngeal -- Pharyngeal- Thin Cup Delayed swallow initiation-vallecula;Penetration/Aspiration during swallow;Compensatory strategies attempted (with notebox);Pharyngeal residue - valleculae Pharyngeal Material enters airway, CONTACTS cords and not ejected out Pharyngeal- Thin Straw Delayed swallow initiation-vallecula;Penetration/Aspiration during swallow Pharyngeal Material enters airway, CONTACTS cords and not ejected out Pharyngeal- Puree Delayed swallow initiation-vallecula;Pharyngeal residue - valleculae Pharyngeal Material does not enter airway Pharyngeal- Mechanical Soft Delayed swallow initiation-vallecula;Pharyngeal residue - valleculae Pharyngeal -- Pharyngeal- Regular -- Pharyngeal -- Pharyngeal- Multi-consistency -- Pharyngeal -- Pharyngeal- Pill -- Pharyngeal -- Pharyngeal Comment --  CHL IP CERVICAL ESOPHAGEAL PHASE 08/25/2015  Cervical Esophageal Phase WFL Pudding Teaspoon -- Pudding Cup -- Honey Teaspoon -- Honey Cup -- Nectar Teaspoon -- Nectar Cup -- Nectar Straw -- Thin Teaspoon -- Thin Cup -- Thin Straw -- Puree -- Mechanical Soft -- Regular -- Multi-consistency -- Pill -- Cervical Esophageal Comment -- CHL IP GO 08/16/2015 Functional Assessment Tool Used ASHA NOMS and clinical judgment.  Functional Limitations Swallowing Swallow Current Status [phone]) (None) Swallow Goal Status [id number]) (None) Swallow Discharge Status [phone]) (None) Motor Speech Current Status [phone]) (None) Motor Speech Goal Status [phone]) (None) Motor Speech Goal Status [phone]) (None) Spoken Language Comprehension Current Status [phone]) (None) Spoken Language Comprehension Goal Status [id number]) (None) Spoken Language Comprehension Discharge Status [phone]) (None) Spoken Language Expression Current Status [phone]) (None) Spoken Language Expression Goal Status [id number]) (None) Spoken Language Expression Discharge Status [phone]) (None) Attention Current Status [id number]) (None) Attention Goal Status [id number]) (None) Attention Discharge Status [phone]) (None) Memory Current Status [id number]) (None) Memory Goal Status [id number]) (None) Memory Discharge Status [id number]) (None) Voice Current Status [id number]) (None) Voice Goal Status [id number]) (None) Voice Discharge Status [phone]) (None) Other Speech-Language Pathology Functional Limitation [phone]) (None) Other Speech-Language Pathology Functional Limitation Goal Status [id number]) (None) Other Speech-Language Pathology Functional Limitation Discharge Status [phone]) (None) PAYNE, COURTNEY 08/25/2015, 4:32 PM              No results for input(s): WBC, HGB, HCT, PLT in the last 72 hours.  Recent Labs  08/24/15 0500  NA 141  K 4.2  CL 107  GLUCOSE 108*  BUN 9  CREATININE 1.17  CALCIUM 8.1*   CBG (last 3)   Recent Labs  08/25/15 1715 08/25/15 2011 08/26/15 0648  GLUCAP 134* 166* 112*    Wt Readings from Last 3 Encounters:   08/26/15 77.2 kg (170 lb 3.1 oz)  08/17/15 75.3 kg (166 lb 0.1 oz)  08/03/15 80.1 kg (176 lb 9.4 oz)    Physical Exam:  Constitutional: He appears well-developed and well-nourished. No acute distress. Vital signs reviewed HENT: Normocephalic. Atraumatic Eyes: Conjunctivae and EOM are normal.  Neck: Normal range of motion. Neck supple.  Cardiovascular: Normal rate. An irregularly irregular rhythm present.  Respiratory: Effort normal and breath sounds normal. No respiratory distress. He has no wheezes. He exhibits no tenderness.  GI: He exhibits no distension. There is no tenderness.  Musculoskeletal: He exhibits no edema or tenderness.  Neurological: He is alert and oriented 3. Appropriate with conversation. B/l UE 5/5 prox to distal.  B/l LE: 4 -/5 hip flexion, knee extension 4 -/5 ankle dorsi/plantarflexion Skin: Skin is warm and dry. He is not diaphoretic.  Dry  skin.  Back incision C/D/I   Psychiatric: He has a normal mood and affect. His behavior is normal.   Assessment/Plan: 1. 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 Physiatrist is providing close team supervision and 24 hour management of active medical problems listed below. Physiatrist and rehab team continue to assess barriers to discharge/monitor patient progress toward functional and medical goals.  Function:  Bathing Bathing position Bathing activity did not occur: Refused Position: Wheelchair/chair at sink  Bathing parts Body parts bathed by patient: Right arm, Left arm, Chest, Abdomen, Front perineal area, Buttocks, Right upper leg, Left upper leg, Right lower leg, Left lower leg Body parts bathed by helper: Back  Bathing assist Assist Level: Touching or steadying assistance(Pt > 75%)      Upper Body Dressing/Undressing Upper body dressing   What is the patient  wearing?: Pull over shirt/dress     Pull over shirt/dress - Perfomed by patient: Thread/unthread right sleeve, Thread/unthread left sleeve, Put head through opening, Pull shirt over trunk          Upper body assist Assist Level: Set up   Set up : To obtain clothing/put  away  Lower Body Dressing/Undressing Lower body dressing   What is the patient wearing?: Underwear, Pants, Shoes, [employer]  - Performed by patient: Thread/unthread right underwear leg, Pull underwear up/down, Thread/unthread left underwear leg Underwear - Performed by helper: Thread/unthread right underwear leg, Thread/unthread left underwear leg Pants- Performed by patient: Thread/unthread left pants leg, Pull pants up/down, Thread/unthread right pants leg Pants- Performed by helper: Thread/unthread right pants leg Non-skid slipper socks- Performed by patient: Don/doff right sock, Don/doff left sock Non-skid slipper socks- Performed by helper: Don/doff right sock, Don/doff left sock Socks - Performed by patient: Don/doff right sock, Don/doff left sock   Shoes - Performed by patient: Don/doff right shoe, Don/doff left shoe Shoes - Performed by helper: Don/doff right shoe       TED Hose - Performed by helper: Don/doff right TED hose, Don/doff left TED hose  Lower body assist Assist for lower body dressing: Touching or steadying assistance (Pt > 75%)      Toileting Toileting   Toileting steps completed by patient: Adjust clothing prior to toileting, Performs perineal hygiene, Adjust clothing after toileting Toileting steps completed by helper: Adjust clothing prior to toileting, Performs perineal hygiene, Adjust clothing after toileting Toileting Assistive Devices: Grab bar or rail  Toileting assist Assist level: Touching or steadying assistance (Pt.75%)   Transfers Chair/bed transfer   Chair/bed transfer method: Ambulatory, Stand pivot Chair/bed transfer assist level: Touching or steadying assistance  (Pt > 75%) Chair/bed transfer assistive device: Armrests, Medical sales representative     Max distance: 50 Assist level: Supervision or verbal cues   Wheelchair   Type: Manual Max wheelchair distance: 200 Assist Level: Supervision or verbal cues  Cognition Comprehension Comprehension assist level: Understands basic 90% of the time/cues < 10% of the time  Expression Expression assist level: Expresses basic 90% of the time/requires cueing < 10% of the time.  Social Interaction Social Interaction assist level: Interacts appropriately 90% of the time - Needs monitoring or encouragement for participation or interaction.  Problem Solving Problem solving assist level: Solves basic 50 - 74% of the time/requires cueing 25 - 49% of the time  Memory Memory assist level: Recognizes or recalls 50 - 74% of the time/requires cueing 25 - 49% of the time   Medical Problem List and Plan: 1. 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 DVT Prophylaxis/Anticoagulation: Pharmaceutical: Coumadin.  .    3. Chronic back pain/Pain Management: tylenol only at present 4. Mood: LCSW to follow for evaluation and support.  5. Neuropsych: This patient is not yet capable of making decisions on his own behalf. 6. Skin/Wound Care: Dry dressing daily. Monitor wound for healing. Added protein supplement to help with low protein stores 7. Fluids/Electrolytes/Nutrition: Continue IVF at night to maintain adequate hydration status.  8. Acute renal failure: Cr sl elevated. Continue to encourage po honey thick liquids.   -Cr 1.17 on 3/14.    -IVF DC'd, follow up labss  9. A fib with RVR: Monitor HR bid. On amiodarone and coumadin --monitor for any symptoms with activity.  10. PMR/RA: Has completed stress dose steroids--now on maintaince dose.  11. Anemia of chronic illness/iron deficiency: On iron  supplement. hgb 11.2 12. GERD: protonix 13. DMT2: Currently on novolog 3u bid with breakfast and lunch.   -used glucotrol 5 mg daily at home---will resume today and begin to taper novolog.  14. CAD: Off ASA/plavix-- now on coumadin  and lipitor.  15. Dysphagia: regular and thins  -recent bmet normal         16. Cough: no signs this morning  -CXR with ?atelectasis left base  -encourage IS, OOB.   LOS (Days) 9 A FACE TO FACE EVALUATION WAS PERFORMED  Andres Vest T 08/26/2015 8:28 AM

## 2015-08-26 NOTE — Progress Notes (Signed)
Occupational Therapy Weekly Progress Note  Patient Details  Name: Curtis Frazier MRN: [id number] Date of Birth: 08-06-1940  Beginning of progress report period: August 18, 2015 End of progress report period: August 26, 2015  Patient has met 5 of 5 short term goals.  Pt has made steady progress with BADLs during this admission.  Pt requires steady A for dynamic standing tasks (LB bathing/dressing and toileting tasks) but completes all other tasks with supervision.  Pt fatigues quickly activity and exhibits SOB requiring rest breaks.  Pt requires min verbal cues to take rest breaks appropriately.  Family has not been present for education.  Patient continues to demonstrate the following deficits: decreased activity tolerance, decreased dynamic standing balance, min impaired memory and therefore will continue to benefit from skilled OT intervention to enhance overall performance with BADL.  Patient progressing toward long term goals of self care tasks.  Self care goals to be continued. IADL goals discontinued.  Plan of care revisions: LTGs of simple meal prep and light housekeeping discontinued as pt does not have adequate activity tolerance to complete these skills safely. .  OT Short Term Goals Week 1:  OT Short Term Goal 1 (Week 1): Pt will be able to tolerate standing for at least 2 minutes without changes in vital signs to be more independent with toileting. OT Short Term Goal 1 - Progress (Week 1): Met OT Short Term Goal 2 (Week 1): Pt will complete toileting with steady A. OT Short Term Goal 2 - Progress (Week 1): Met OT Short Term Goal 3 (Week 1): Pt will use AE to don LB clothing with min A. OT Short Term Goal 3 - Progress (Week 1): Met OT Short Term Goal 4 (Week 1): Pt will use long sponge to wash feet. OT Short Term Goal 4 - Progress (Week 1): Met OT Short Term Goal 5 (Week 1): Pt will complete smooth stand to sit with steadying A. OT Short Term Goal 5 - Progress (Week 1): Met Week 2:  OT  Short Term Goal 1 (Week 2): STG=LTG secondary to ELOS   Therapy Documentation Precautions:  Precautions Precautions: Fall, Back Precaution Comments: Recent spine surgery 2/2 Restrictions Weight Bearing Restrictions: No    ADL: ADL ADL Comments: Refer to functional navigator   See Function Navigator for Current Functional Status.    [facility] [place] 08/26/2015, 2:55 PM

## 2015-08-26 NOTE — Progress Notes (Signed)
ANTICOAGULATION CONSULT NOTE - Follow Up Consult  Pharmacy Consult for Coumadin Indication: atrial fibrillation  Allergies  Allergen Reactions  . Ace Inhibitors Other (See Comments)    Causes high K  . Angiotensin Receptor Blockers Other (See Comments)    Causes high K  . Metoprolol Other (See Comments)    May cause elevated K level  . Penicillins Swelling and Rash  . Tetracycline Swelling and Rash    Patient Measurements: Weight: 170 lb 3.1 oz (77.2 kg) Heparin Dosing Weight:   Vital Signs: Temp: 98.4 F (36.9 C) (03/16 0615) Temp Source: Oral (03/16 0615) BP: 120/70 mmHg (03/16 0615) Pulse Rate: 82 (03/16 0615)  Labs:  Recent Labs  08/24/15 0500 08/25/15 0356 08/26/15 0552  LABPROT 20.9* 17.5* 19.3*  INR 1.81* 1.43 1.62*  CREATININE 1.17  --   --     Estimated Creatinine Clearance: 52 mL/min (by C-G formula based on Cr of 1.17).   Assessment: 75yo male on Coumadin for AFib, Lovenox until INR therapeutic.  INR 1.62 this AM, no CBC today, no bleeding.  Goal of Therapy:  INR 2-3 Monitor platelets by anticoagulation protocol: Yes   Plan:  Repeat Coumadin 5mg  today Continue Lovenox Daily INR Watch for s/s of bleeding  Maryanna Shape, PharmD, BCPS  Clinical Pharmacist  Pager: [phone]

## 2015-08-26 NOTE — Progress Notes (Signed)
Speech Language Pathology Daily Session Note  Patient Details  Name: Curtis Frazier MRN: [id number] Date of Birth: 1940/12/03  Today's Date: 08/26/2015 SLP Individual Time: 1000-1100 SLP Individual Time Calculation (min): 60 min  Short Term Goals: Week 2: SLP Short Term Goal 1 (Week 2): Patient will consume current diet with minimal overt s/s of aspiration with Min A verbal cues for use of swallowing compensatory strategies.  SLP Short Term Goal 2 (Week 2): Patient will perform pharyngeal strengthening exercises with Mod I for accuracy.  SLP Short Term Goal 3 (Week 2): Patient will utilize external memory aids to recall new, daily information with supervision verbal and questioning cues.  SLP Short Term Goal 4 (Week 2): Patient will demonstrate selective attention to a functional task in a mildly distracting enviornment for 60 minutes with supervision  verbal cues for redirection.   Skilled Therapeutic Interventions: Skilled treatment session focused on cognitive goals. SLP facilitated session by providing Mod-Max A question cues for recall of his current medications and their functions and Min A question cues for functional problem solving during a mildly complex task of organizing a 4 time per day pill box. Patient attended to task for ~45 minutes in a mildly distracting environment with Mod I. Patient left upright in wheelchair with family present. Continue with current plan of care.    Function:   Cognition Comprehension Comprehension assist level: Understands basic 90% of the time/cues < 10% of the time  Expression   Expression assist level: Expresses basic 90% of the time/requires cueing < 10% of the time.  Social Interaction Social Interaction assist level: Interacts appropriately 90% of the time - Needs monitoring or encouragement for participation or interaction.  Problem Solving Problem solving assist level: Solves basic 75 - 89% of the time/requires cueing 10 - 24% of the time   Memory Memory assist level: Recognizes or recalls 50 - 74% of the time/requires cueing 25 - 49% of the time    Pain No/Denies Pain   Therapy/Group: Individual Therapy  Curtis Frazier 08/26/2015, 3:48 PM

## 2015-08-26 NOTE — Progress Notes (Signed)
Occupational Therapy Session Note  Patient Details  Name: Curtis Frazier MRN: [id number] Date of Birth: 08/31/1940  Today's Date: 08/26/2015 OT Individual Time: [id number] Individual Time Calculation (min): 75 min    Short Term Goals: Week 1:  OT Short Term Goal 1 (Week 1): Pt will be able to tolerate standing for at least 2 minutes without changes in vital signs to be more independent with toileting. OT Short Term Goal 2 (Week 1): Pt will complete toileting with steady A. OT Short Term Goal 3 (Week 1): Pt will use AE to don LB clothing with min A. OT Short Term Goal 4 (Week 1): Pt will use long sponge to wash feet. OT Short Term Goal 5 (Week 1): Pt will complete smooth stand to sit with steadying A.  Skilled Therapeutic Interventions/Progress Updates:    Pt engaged in BADL retraining including bathing at shower level and dressing with sit<>stand from w/c at sink. Pt amb in room to gather clothing prior to amb with RW into bathroom for shower.  Pt required rest break after gathering clothing prior to walking into bathroom.  Pt completed bathing tasks with steady A for standing balance and multiple rest breaks when SOB.  Pt required min verbal cues to take rest breaks appropriately.  Pt completed dressing tasks with sit<>stand from w/c using appropriate AE for LB dressing.  Pt completed grooming at sink.  Focus on activity tolerance, sit<>stand, standing balance, functional amb with RW, and safety awareness to increase independence with BADLs.   Therapy Documentation Precautions:  Precautions Precautions: Fall, Back Precaution Comments: Recent spine surgery 2/2 Restrictions Weight Bearing Restrictions: No Pain:  Pt denied pain ADL: ADL ADL Comments: Refer to functional navigator   See Function Navigator for Current Functional Status.   Therapy/Group: Individual Therapy  Leroy Libman 08/26/2015, 9:35 AM

## 2015-08-26 NOTE — Progress Notes (Signed)
Physical Therapy Weekly Progress Note  Patient Details  Name: Curtis Frazier MRN: [id number] Date of Birth: 04-04-1941  Beginning of progress report period: August 18, 2015 End of progress report period: August 26, 2015  Today's Date: 08/26/2015 PT Individual Time: 7341-9379 PT Individual Time Calculation (min): 60 min   Patient has met 3 of 4 short term goals. Patient has made steady progress with overall strength and balance since last reporting period but continues to be limited by significantly decreased activity tolerance. Patient requires min A verbal/questioning cues for recall of daily information, which he reports is baseline since surgery in 2012. Patient is able to tolerate standing < 2 minutes consistently before requiring seated rest breaks. He requires supervision-min A overall for functional mobility with use of RW. Patient requires seated rest breaks with all mobility due to fatigue and shortness of breath but appears able to recover more quickly since beginning of progress report period. Patient's children are frequently present to observe therapies but may benefit from initiation of hands-on training for family education.  Patient continues to demonstrate the following deficits: LLE weakness, muscle joint tightness, decreased cardiorespiratory endurance, decreased standing balance, decreased balance strategies and therefore will continue to benefit from skilled PT intervention to enhance overall performance with activity tolerance, balance, postural control, ability to compensate for deficits and coordination.  Patient not progressing toward long term goals.  See goal revision.  Plan of care revisions: downgraded standing balance and ambulation to supervision household distances.  PT Short Term Goals Week 1:  PT Short Term Goal 1 (Week 1): Patient will perform transfers with supervision. PT Short Term Goal 1 - Progress (Week 1): Progressing toward goal PT Short Term Goal 2 (Week 1):  Patient will ambulate 50 ft using LRAD with supervision. PT Short Term Goal 2 - Progress (Week 1): Met PT Short Term Goal 3 (Week 1): Patient will negotiate up/down 1 step with 1 rail with min A.  PT Short Term Goal 3 - Progress (Week 1): Met PT Short Term Goal 4 (Week 1): Patient will maintain standing balance x 1 min with min A.  PT Short Term Goal 4 - Progress (Week 1): Met Week 2:  PT Short Term Goal 1 (Week 2): = LTGs due to anticipated LOS  Skilled Therapeutic Interventions/Progress Updates:   Session focused on functional mobility training, generalized strengthening, activity tolerance, and initiation of HEP. Patient propelled wheelchair using BUE x 200 ft with supervision. Performed ambulatory and stand pivot transfers using RW with supervision. Instructed in 5TSS with UE support, supervision = 23 seconds improved from 82 seconds on 08/20/15 with min A. Stair training up/down 4 (6") stairs using 2 rails x 2 with step-to pattern and close supervision, seated rest break between trials. Patient educated on energy conservation throughout day and safe activities to participate in at/around home, reinforced no driving including mowing lawn until MD clearance received, pt verbalized understanding. Instructed in seated/standing OTAGO HEP for BLE strengthening, muscular endurance, and falls prevention using RW for UE support: seated LAQ x10 each LE, standing marching x 20, standing heel raises x 20, standing knee flexion x 10 each LE, standing hip abduction x 10 each LE, and mini squats with max multimodal cues for technique 2 x 10. Gait training using RW 2 x 50 ft in straight path with supervision. Performed NuStep using BLE only at level 3 x 5 min with rest breaks as needed. Patient required seated rest breaks between all tasks due to [place]. Patient propelled  wheelchair back to room with son.    Therapy Documentation Precautions:  Precautions Precautions: Fall, Back Precaution Comments: Recent  spine surgery 2/2 Restrictions Weight Bearing Restrictions: No Pain:  Denied pain   See Function Navigator for Current Functional Status.  Therapy/Group: Individual Therapy  [name] 08/26/2015, 3:13 PM

## 2015-08-27 ENCOUNTER — Inpatient Hospital Stay (HOSPITAL_COMMUNITY): Payer: Medicare Other | Admitting: Speech Pathology

## 2015-08-27 ENCOUNTER — Inpatient Hospital Stay (HOSPITAL_COMMUNITY): Payer: Medicare Other | Admitting: *Deleted

## 2015-08-27 ENCOUNTER — Ambulatory Visit: Payer: Medicare Other | Admitting: Family Medicine

## 2015-08-27 ENCOUNTER — Ambulatory Visit (HOSPITAL_COMMUNITY): Payer: Medicare Other | Admitting: Speech Pathology

## 2015-08-27 ENCOUNTER — Inpatient Hospital Stay (HOSPITAL_COMMUNITY): Payer: Medicare Other

## 2015-08-27 LAB — GLUCOSE, CAPILLARY
GLUCOSE-CAPILLARY: 87 mg/dL (ref 65–99)
GLUCOSE-CAPILLARY: 149 mg/dL — AB (ref 65–99)
Glucose-Capillary: 106 mg/dL — ABNORMAL HIGH (ref 65–99)
Glucose-Capillary: 100 mg/dL — ABNORMAL HIGH (ref 65–99)

## 2015-08-27 LAB — PROTIME-INR
INR: 1.91 — AB (ref 0.00–1.49)
PROTHROMBIN TIME: 21.8 s — AB (ref 11.6–15.2)

## 2015-08-27 MED ORDER — WARFARIN SODIUM 2.5 MG PO TABS
2.5000 mg | ORAL_TABLET | Freq: Once | ORAL | Status: AC
Start: 2015-08-27 — End: 2015-08-27
  Administered 2015-08-27: 2.5 mg via ORAL
  Filled 2015-08-27: qty 1

## 2015-08-27 NOTE — Progress Notes (Signed)
Speech Language Pathology Daily Session Note  Patient Details  Name: Curtis Frazier MRN: [id number] Date of Birth: April 01, 1941  Today's Date: 08/27/2015 SLP Individual Time: 0800-0900 SLP Individual Time Calculation (min): 60 min  Short Term Goals: Week 2: SLP Short Term Goal 1 (Week 2): Patient will consume current diet with minimal overt s/s of aspiration with Min A verbal cues for use of swallowing compensatory strategies.  SLP Short Term Goal 2 (Week 2): Patient will perform pharyngeal strengthening exercises with Mod I for accuracy.  SLP Short Term Goal 3 (Week 2): Patient will utilize external memory aids to recall new, daily information with supervision verbal and questioning cues.  SLP Short Term Goal 4 (Week 2): Patient will demonstrate selective attention to a functional task in a mildly distracting enviornment for 60 minutes with supervision  verbal cues for redirection.   Skilled Therapeutic Interventions: Skilled treatment session focused on dysphagia and cognitive goals. SLP facilitated session by providing supervision-Min A verbal cues for use of swallowing compensatory strategies with breakfast meal of regular textures with thin liquids without overt s/s of aspiration. SLP also facilitated session by providing supervision verbal cues for use of memory compensatory strategies and Min A verbal cues for recall during a new learning, novel task. Patient left upright in wheelchair with all needs within reach. Continue with current plan of care.    Function:  Eating Eating   Modified Consistency Diet: No Eating Assist Level: Supervision or verbal cues;More than reasonable amount of time           Cognition Comprehension Comprehension assist level: Understands basic 90% of the time/cues < 10% of the time  Expression   Expression assist level: Expresses complex 90% of the time/cues < 10% of the time  Social Interaction Social Interaction assist level: Interacts appropriately 90%  of the time - Needs monitoring or encouragement for participation or interaction.  Problem Solving Problem solving assist level: Solves basic 75 - 89% of the time/requires cueing 10 - 24% of the time  Memory Memory assist level: Recognizes or recalls 75 - 89% of the time/requires cueing 10 - 24% of the time    Pain No/Denies Pain   Therapy/Group: Individual Therapy  Amera Banos 08/27/2015, 12:45 PM

## 2015-08-27 NOTE — Progress Notes (Signed)
Physical Therapy Session Note  Patient Details  Name: Curtis Frazier MRN: [id number] Date of Birth: 1941/05/08  Today's Date: 08/27/2015 PT Individual Time: 1450-1535 PT Individual Time Calculation (min): 45 min   Short Term Goals: Week 2:  PT Short Term Goal 1 (Week 2): = LTGs due to anticipated LOS  Skilled Therapeutic Interventions/Progress Updates:    Tx focused on functional mobility training, gait with RW, and NMR via forced use, manual facilitation, and multi-modal cues. Pt needed cue for upright posture as he was slouched in WC upon arrival. No c/o pain.   Pt propelled WC x200' with UEs for strengthening with S. Sit<>stands x6 throughout tx with cues for back precautions and controlled descent.   Gait in controled setting 3x110' with S/min-guard A due to 2 LOB when R toe caught. Pt needed cues for scapular depression, upright posture, and proximity to RW. Distance limited by fatigue and LE weakness.   Pt instructed in [place] with handout in [place] bil x10 each with cues and demo for technique. Pt had most difficulty with all ex involving R SLS due to R hip pain as well as DF due to weakness.   Pt left up in [facility] with all needs in reach. Attempted pain relief measures, but he declined heat/ice. Modified tx prn   Therapy Documentation Precautions:  Precautions Precautions: Fall, Back Precaution Comments: Recent spine surgery 2/2 Restrictions Weight Bearing Restrictions: No General:   Vital Signs: Therapy Vitals Temp: 98.7 F (37.1 C) Temp Source: Oral Pulse Rate: 88 Resp: 18 BP: 116/64 mmHg Patient Position (if appropriate): Sitting Oxygen Therapy SpO2: 100 % O2 Device: Not Delivered Pain: unrateed R hip pain during exercise, modified tx and rest provided.      See Function Navigator for Current Functional Status.   Therapy/Group: Individual Therapy   Kennieth Rad, PT, DPT  08/27/2015, 3:11 PM

## 2015-08-27 NOTE — Progress Notes (Signed)
Occupational Therapy Session Note  Patient Details  Name: Curtis Frazier MRN: [id number] Date of Birth: 10-16-1940  Today's Date: 08/27/2015 OT Individual Time: 0915-1030 OT Individual Time Calculation (min): 75 min    Short Term Goals: Week 2:  OT Short Term Goal 1 (Week 2): STG=LTG secondary to ELOS  Skilled Therapeutic Interventions/Progress Updates:    Pt engaged in BADL retraining including bathing at shower level and dressing with sit<>stand from w/c at sink. Pt requires steady A when standing in shower.  Pt continues to exhibit SOB with activity (sitting or standing) and requires multiple rest breaks throughout session.  Pt transitioned to ADL apartment and practiced tub bench transfers.  Pt stated that his family had purchased "grab bars" to attach to ledge of tub.  Pt practiced stepping over into tub with "grab bars", requiring min A.  Pt amb with RW from ADL apartment to gym (approx 81') with steady A and engaged in dynamic standing tasks with focus on increased activity tolerance and dynamic standing balance.   Therapy Documentation Precautions:  Precautions Precautions: Fall, Back Precaution Comments: Recent spine surgery 2/2 Restrictions Weight Bearing Restrictions: No   Pain: Pain Assessment Pain Assessment: No/denies pain ADL: ADL ADL Comments: Refer to functional navigator   See Function Navigator for Current Functional Status.   Therapy/Group: Individual Therapy  Leroy Libman 08/27/2015, 12:13 PM

## 2015-08-27 NOTE — Progress Notes (Signed)
Speech Language Pathology Daily Session Note  Patient Details  Name: Curtis Frazier MRN: [id number] Date of Birth: 1940/12/12  Today's Date: 08/27/2015 SLP Individual Time: 1100-1130 SLP Individual Time Calculation (min): 30 min  Short Term Goals: Week 2: SLP Short Term Goal 1 (Week 2): Patient will consume current diet with minimal overt s/s of aspiration with Min A verbal cues for use of swallowing compensatory strategies.  SLP Short Term Goal 2 (Week 2): Patient will perform pharyngeal strengthening exercises with Mod I for accuracy.  SLP Short Term Goal 3 (Week 2): Patient will utilize external memory aids to recall new, daily information with supervision verbal and questioning cues.  SLP Short Term Goal 4 (Week 2): Patient will demonstrate selective attention to a functional task in a mildly distracting enviornment for 60 minutes with supervision  verbal cues for redirection.   Skilled Therapeutic Interventions: Pt upright in wheelchair. Able to self-manuever around obstacles and follow directions to locate the day room. Pt able to recall swallow precautions independently. Pt trialed with thin liquids in a distracting environment and was able to demonstrate compliance with swallow precautions with 100% acc at mod I. Pt able to provide detailed instructions to drive to his home with only min A of clarification questions.   Function:  Eating Eating   Modified Consistency Diet: No Eating Assist Level: Supervision or verbal cues;More than reasonable amount of time           Cognition Comprehension Comprehension assist level: Understands basic 90% of the time/cues < 10% of the time  Expression   Expression assist level: Expresses complex 90% of the time/cues < 10% of the time  Social Interaction Social Interaction assist level: Interacts appropriately 90% of the time - Needs monitoring or encouragement for participation or interaction.  Problem Solving Problem solving assist level:  Solves basic 75 - 89% of the time/requires cueing 10 - 24% of the time  Memory Memory assist level: Recognizes or recalls 75 - 89% of the time/requires cueing 10 - 24% of the time    Pain Pain Assessment Pain Assessment: No/denies pain  Therapy/Group: Individual Therapy  Vinetta Bergamo 08/27/2015, 4:32 PM

## 2015-08-27 NOTE — Progress Notes (Signed)
[place] PHYSICAL MEDICINE & REHABILITATION     PROGRESS NOTE  Subjective/Complaints: Up eating breakfast. Feeling well. Denies pain. Good appetite.   ROS: Denies CP, SOB, N/V/D.  Objective: Vital Signs: Blood pressure 124/65, pulse 89, temperature 98.5 F (36.9 C), temperature source Oral, resp. rate 18, weight 77 kg (169 lb 12.1 oz), SpO2 95 %. Dg Swallowing Func-speech Pathology  08/25/2015  Objective Swallowing Evaluation: Type of Study: MBS-Modified Barium Swallow Study Patient Details Name: Curtis Frazier MRN: [id number] Date of Birth: 05/23/1941 Today's Date: 08/25/2015 Time: SLP Start Time (ACUTE ONLY): 0940-SLP Stop Time (ACUTE ONLY): 1015 SLP Time Calculation (min) (ACUTE ONLY): 35 min Past Medical History: Past Medical History Diagnosis Date . Other and unspecified hyperlipidemia  . Coronary atherosclerosis of unspecified type of vessel, native or graft  . Personal history of colonic polyps  . Actinic keratosis  . Psoriatic arthropathy ([place])  . Other psoriasis  . Scoliosis (and kyphoscoliosis), idiopathic  . GI bleed 12/10   cecam AVM . Gastritis 12/10 . Hyperlipidemia    takes Pravastatin daily . Hypertension    takes Metoprolol daily . Shortness of breath    with exertion . Headache(784.0)    occasionally . Joint pain  . Joint swelling  . Chronic back pain    scoliosis/stenosis/radiculopathy,degenerative disc disease . Psoriasis  . Bruises easily  . Esophageal reflux    takes Omeprazole daily . History of colonic polyps  . Hemorrhoids  . Kidney stone    hx of . Blood transfusion 2010 . PMR (polymyalgia rheumatica) (HCC) 01/20/2012   tx by specialist on a long prednisone taper . S/P PTCA (percutaneous transluminal coronary angioplasty)  . Rheumatoid arthritis(714.0)    takes Methotrexate 7pills weekly . Degeneration of intervertebral disc, site unspecified  . Myocardial infarction ([place]) 2010 . Pneumonia 2008; 07/2015 . Type II or unspecified type diabetes mellitus without mention of complication,  not stated as uncontrolled  . Skin cancer    "arms" Past Surgical History: Past Surgical History Procedure Laterality Date . Rotator cuff repair Left 2000 . Colonoscopy   . Vasectomy  1979 . Coronary angioplasty with stent placement  02/2009   has one stent . Carotid doppler  10/12   0-39% R and 60-79% left  . Cataract extraction  2012 . Cardiac catheterization  2010 . Lithotripsy  2009 . Tee without cardioversion N/A 08/06/2015   Procedure: TRANSESOPHAGEAL ECHOCARDIOGRAM (TEE);  Surgeon: Skeet Latch, MD;  Location: [place];  Service: Cardiovascular;  Laterality: N/A; . Cardioversion N/A 08/06/2015   Procedure: CARDIOVERSION;  Surgeon: Skeet Latch, MD;  Location: [facility] ENDOSCOPY;  Service: Cardiovascular;  Laterality: N/A; . Back surgery   . Lumbar laminectomy  07/2015 . Posterior laminectomy thoracic spine     "fixed the discs" . Skin cancer excision     "arms" HPI: 75 year old male with history of hypertension, GERD, A. fib, sepsis related to pneumonia with related shock, respiratory failure requiring intubation, A. fib with RVR admitted from [facility] with AMS suspicious for infectious vs medication induced etiologies. FEES 3/2 with moderate oropharyngeal dysphagia with recommendations for dysphagia 1 textures with honey thick liquids via tsp. Patient admitted to [facility] 3/7 and has been participating in dysphagia treatment. MBS today to assess for possible diet upgrade.  Subjective: pt is eager for a cup of coffee Assessment / Plan / Recommendation CHL IP CLINICAL IMPRESSIONS 08/25/2015 Therapy Diagnosis Mild oral phase dysphagia;Mild pharyngeal phase dysphagia Clinical Impression Patient demonstrates a mild oropharyngeal dysphagia characterized by delayed  oral transit with piecemeal swallowing with delayed swallow initiation resulting in impaired timing of swallow and intermittent silent penetration of nectar-thick and thin liquids via cup. A cued cough was effective in expelling penetrates  from the laryngeal vestibule and a chin tuck was effective in eliminating penetration episodes. Recommend patient initiate a diet of regular textures with thin liquids with full supervision for utilization of swallowing compensatory strategies.  Patient and daughter verbalize understanding of all information.  Impact on safety and function Mild aspiration risk   CHL IP TREATMENT RECOMMENDATION 08/25/2015 Treatment Recommendations Therapy as outlined in treatment plan below   Prognosis 08/25/2015 Prognosis for Safe Diet Advancement Good Barriers to Reach Goals -- Barriers/Prognosis Comment -- CHL IP DIET RECOMMENDATION 08/25/2015 SLP Diet Recommendations Regular solids;Thin liquid Liquid Administration via Cup;No straw Medication Administration Crushed with puree Compensations Minimize environmental distractions;Slow rate;Small sips/bites;Multiple dry swallows after each bite/sip;Hard cough after swallow;Chin tuck Postural Changes Seated upright at 90 degrees   CHL IP OTHER RECOMMENDATIONS 08/25/2015 Recommended Consults -- Oral Care Recommendations Oral care BID Other Recommendations --   CHL IP FOLLOW UP RECOMMENDATIONS 08/25/2015 Follow up Recommendations Outpatient SLP;24 hour supervision/assistance   CHL IP FREQUENCY AND DURATION 08/25/2015 Speech Therapy Frequency (ACUTE ONLY) min 5x/week Treatment Duration 2 weeks      CHL IP ORAL PHASE 08/25/2015 Oral Phase Impaired Oral - Pudding Teaspoon -- Oral - Pudding Cup -- Oral - Honey Teaspoon Piecemeal swallowing;Delayed oral transit Oral - Honey Cup Piecemeal swallowing;Delayed oral transit Oral - Nectar Teaspoon Piecemeal swallowing;Delayed oral transit Oral - Nectar Cup Piecemeal swallowing;Delayed oral transit Oral - Nectar Straw -- Oral - Thin Teaspoon Piecemeal swallowing;Delayed oral transit Oral - Thin Cup Delayed oral transit;Piecemeal swallowing Oral - Thin Straw Delayed oral transit;Piecemeal swallowing Oral - Puree Delayed oral transit;Piecemeal swallowing Oral  - Mech Soft Delayed oral transit;Piecemeal swallowing Oral - Regular -- Oral - Multi-Consistency -- Oral - Pill -- Oral Phase - Comment --  CHL IP PHARYNGEAL PHASE 08/25/2015 Pharyngeal Phase Impaired Pharyngeal- Pudding Teaspoon -- Pharyngeal -- Pharyngeal- Pudding Cup -- Pharyngeal -- Pharyngeal- Honey Teaspoon Delayed swallow initiation-vallecula;Reduced tongue base retraction Pharyngeal Material does not enter airway Pharyngeal- Honey Cup Delayed swallow initiation-vallecula;Pharyngeal residue - valleculae Pharyngeal Material does not enter airway Pharyngeal- Nectar Teaspoon Delayed swallow initiation-vallecula;Pharyngeal residue - valleculae Pharyngeal Material does not enter airway Pharyngeal- Nectar Cup Compensatory strategies attempted (with notebox);Penetration/Aspiration during swallow;Delayed swallow initiation-vallecula;Pharyngeal residue - valleculae Pharyngeal Material enters airway, remains ABOVE vocal cords and not ejected out Pharyngeal- Nectar Straw -- Pharyngeal -- Pharyngeal- Thin Teaspoon Delayed swallow initiation-vallecula;Pharyngeal residue - valleculae Pharyngeal -- Pharyngeal- Thin Cup Delayed swallow initiation-vallecula;Penetration/Aspiration during swallow;Compensatory strategies attempted (with notebox);Pharyngeal residue - valleculae Pharyngeal Material enters airway, CONTACTS cords and not ejected out Pharyngeal- Thin Straw Delayed swallow initiation-vallecula;Penetration/Aspiration during swallow Pharyngeal Material enters airway, CONTACTS cords and not ejected out Pharyngeal- Puree Delayed swallow initiation-vallecula;Pharyngeal residue - valleculae Pharyngeal Material does not enter airway Pharyngeal- Mechanical Soft Delayed swallow initiation-vallecula;Pharyngeal residue - valleculae Pharyngeal -- Pharyngeal- Regular -- Pharyngeal -- Pharyngeal- Multi-consistency -- Pharyngeal -- Pharyngeal- Pill -- Pharyngeal -- Pharyngeal Comment --  CHL IP CERVICAL ESOPHAGEAL PHASE 08/25/2015  Cervical Esophageal Phase WFL Pudding Teaspoon -- Pudding Cup -- Honey Teaspoon -- Honey Cup -- Nectar Teaspoon -- Nectar Cup -- Nectar Straw -- Thin Teaspoon -- Thin Cup -- Thin Straw -- Puree -- Mechanical Soft -- Regular -- Multi-consistency -- Pill -- Cervical Esophageal Comment -- CHL IP GO 08/16/2015 Functional Assessment Tool Used ASHA NOMS and clinical judgment.  Functional Limitations Swallowing Swallow Current Status [phone]) (None) Swallow Goal Status [id number]) (None) Swallow Discharge Status [phone]) (None) Motor Speech Current Status [phone]) (None) Motor Speech Goal Status [phone]) (None) Motor Speech Goal Status [phone]) (None) Spoken Language Comprehension Current Status [phone]) (None) Spoken Language Comprehension Goal Status [id number]) (None) Spoken Language Comprehension Discharge Status [phone]) (None) Spoken Language Expression Current Status [phone]) (None) Spoken Language Expression Goal Status [phone]) (None) Spoken Language Expression Discharge Status [phone]) (None) Attention Current Status [id number]) (None) Attention Goal Status [id number]) (None) Attention Discharge Status [phone]) (None) Memory Current Status [id number]) (None) Memory Goal Status [id number]) (None) Memory Discharge Status [id number]) (None) Voice Current Status [id number]) (None) Voice Goal Status [id number]) (None) Voice Discharge Status [phone]) (None) Other Speech-Language Pathology Functional Limitation [phone]) (None) Other Speech-Language Pathology Functional Limitation Goal Status [id number]) (None) Other Speech-Language Pathology Functional Limitation Discharge Status [phone]) (None) PAYNE, COURTNEY 08/25/2015, 4:32 PM              No results for input(s): WBC, HGB, HCT, PLT in the last 72 hours. No results for input(s): NA, K, CL, GLUCOSE, BUN, CREATININE, CALCIUM in the last 72 hours.  Invalid input(s): CO CBG (last 3)   Recent Labs  08/26/15 1611 08/26/15 2055 08/27/15 0626  GLUCAP 168* 106* 106*    Wt Readings from Last 3 Encounters:   08/27/15 77 kg (169 lb 12.1 oz)  08/17/15 75.3 kg (166 lb 0.1 oz)  08/03/15 80.1 kg (176 lb 9.4 oz)    Physical Exam:  Constitutional: He appears well-developed and well-nourished. No acute distress. Vital signs reviewed HENT: Normocephalic. Atraumatic Eyes: Conjunctivae and EOM are normal.  Neck: Normal range of motion. Neck supple.  Cardiovascular: Normal rate. An irregularly irregular rhythm present.  Respiratory: Effort normal and breath sounds normal. No respiratory distress. He has no wheezes. He exhibits no tenderness.  GI: He exhibits no distension. There is no tenderness.  Musculoskeletal: He exhibits no edema or tenderness.  Neurological: He is alert and oriented 3. Appropriate with conversation. B/l UE 5/5 prox to distal.  B/l LE: 4 -/5 hip flexion, knee extension 4 -/5 ankle dorsi/plantarflexion Skin: Skin is warm and dry. He is not diaphoretic.  Dry  skin.  Back incision C/D/I   Psychiatric: He has a normal mood and affect. His behavior is normal.   Assessment/Plan: 1. 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 Physiatrist is providing close team supervision and 24 hour management of active medical problems listed below. Physiatrist and rehab team continue to assess barriers to discharge/monitor patient progress toward functional and medical goals.  Function:  Bathing Bathing position Bathing activity did not occur: Refused Position: Production manager parts bathed by patient: Right arm, Left arm, Chest, Abdomen, Front perineal area, Buttocks, Right upper leg, Left upper leg, Right lower leg, Left lower leg Body parts bathed by helper: Back  Bathing assist Assist Level: Touching or steadying assistance(Pt > 75%)      Upper Body Dressing/Undressing Upper body dressing   What is the patient wearing?: Pull over  shirt/dress     Pull over shirt/dress - Perfomed by patient: Thread/unthread right sleeve, Thread/unthread left sleeve, Put head through opening, Pull shirt over trunk          Upper body assist Assist Level: Supervision or verbal cues   Set up : To obtain clothing/put away  Lower Body Dressing/Undressing Lower body dressing  What is the patient wearing?: Underwear, Pants, Shoes, [employer]  - Performed by patient: Thread/unthread right underwear leg, Pull underwear up/down, Thread/unthread left underwear leg Underwear - Performed by helper: Thread/unthread right underwear leg, Thread/unthread left underwear leg Pants- Performed by patient: Thread/unthread left pants leg, Pull pants up/down, Thread/unthread right pants leg, Fasten/unfasten pants Pants- Performed by helper: Thread/unthread right pants leg Non-skid slipper socks- Performed by patient: Don/doff right sock, Don/doff left sock Non-skid slipper socks- Performed by helper: Don/doff right sock, Don/doff left sock Socks - Performed by patient: Don/doff right sock, Don/doff left sock   Shoes - Performed by patient: Don/doff right shoe, Don/doff left shoe Shoes - Performed by helper: Don/doff right shoe       TED Hose - Performed by helper: Don/doff right TED hose, Don/doff left TED hose  Lower body assist Assist for lower body dressing: Touching or steadying assistance (Pt > 75%)      Toileting Toileting   Toileting steps completed by patient: Adjust clothing prior to toileting, Performs perineal hygiene, Adjust clothing after toileting Toileting steps completed by helper: Adjust clothing prior to toileting, Performs perineal hygiene, Adjust clothing after toileting Toileting Assistive Devices: Grab bar or rail  Toileting assist Assist level: Touching or steadying assistance (Pt.75%)   Transfers Chair/bed transfer   Chair/bed transfer method: Ambulatory, Stand pivot Chair/bed transfer assist level: Touching or  steadying assistance (Pt > 75%) Chair/bed transfer assistive device: Armrests, Medical sales representative     Max distance: 50 Assist level: Supervision or verbal cues   Wheelchair   Type: Manual Max wheelchair distance: 200 Assist Level: Supervision or verbal cues  Cognition Comprehension Comprehension assist level: Understands basic 90% of the time/cues < 10% of the time  Expression Expression assist level: Expresses complex 90% of the time/cues < 10% of the time  Social Interaction Social Interaction assist level: Interacts appropriately 90% of the time - Needs monitoring or encouragement for participation or interaction.  Problem Solving Problem solving assist level: Solves basic 50 - 74% of the time/requires cueing 25 - 49% of the time  Memory Memory assist level: Recognizes or recalls 50 - 74% of the time/requires cueing 25 - 49% of the time   Medical Problem List and Plan: 1. 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 DVT Prophylaxis/Anticoagulation: Pharmaceutical: Coumadin.  .    3. Chronic back pain/Pain Management: tylenol only at present 4. Mood: LCSW to follow for evaluation and support.  5. Neuropsych: This patient is not yet capable of making decisions on his own behalf. 6. Skin/Wound Care: Dry dressing daily. Monitor wound for healing. Added protein supplement to help with low protein stores 7. Fluids/Electrolytes/Nutrition: Continue IVF at night to maintain adequate hydration status.  8. Acute renal failure: Cr sl elevated. Continue to encourage po honey thick liquids.   -Cr 1.17 on 3/14.    -IVF DC'd, follow up labss  9. A fib with RVR: Monitor HR bid. On amiodarone and coumadin --monitor for any symptoms with activity.  10. PMR/RA: Has completed stress dose steroids--now on maintaince dose.  11. Anemia of chronic illness/iron deficiency: On  iron supplement. hgb 11.2 12. GERD: protonix 13. DMT2: Currently on novolog 3u bid with breakfast and lunch.   -used glucotrol 5 mg daily at home--resumed yesterday---cbg's perfect  -will dc novolg and observe.  14. CAD: Off ASA/plavix-- now on coumadin and lipitor.  15. Dysphagia: regular and thins  -  recent bmet normal         16. Cough: no signs this morning  -encourage IS, OOB.   LOS (Days) 10 A FACE TO FACE EVALUATION WAS PERFORMED  SWARTZ,ZACHARY T 08/27/2015 8:44 AM

## 2015-08-27 NOTE — Progress Notes (Signed)
ANTICOAGULATION CONSULT NOTE - Follow Up Consult  Pharmacy Consult for Coumadin Indication: atrial fibrillation  Allergies  Allergen Reactions  . Ace Inhibitors Other (See Comments)    Causes high K  . Angiotensin Receptor Blockers Other (See Comments)    Causes high K  . Metoprolol Other (See Comments)    May cause elevated K level  . Penicillins Swelling and Rash  . Tetracycline Swelling and Rash    Patient Measurements: Weight: 169 lb 12.1 oz (77 kg) Heparin Dosing Weight:   Vital Signs: Temp: 98.5 F (36.9 C) (03/17 0429) Temp Source: Oral (03/17 0429) BP: 124/65 mmHg (03/17 0429) Pulse Rate: 89 (03/17 0429)  Labs:  Recent Labs  08/25/15 0356 08/26/15 0552 08/27/15 0534  LABPROT 17.5* 19.3* 21.8*  INR 1.43 1.62* 1.91*    Estimated Creatinine Clearance: 52 mL/min (by C-G formula based on Cr of 1.17).   Assessment: 75yo male on Coumadin for AFib, Lovenox until INR therapeutic.  INR 1.91 this AM, no CBC today, no bleeding per chart.  Goal of Therapy:  INR 2-3 Monitor platelets by anticoagulation protocol: Yes   Plan:  Repeat Coumadin 2.5mg  today Continue Lovenox Daily INR Watch for s/s of bleeding  Maryanna Shape, PharmD, BCPS  Clinical Pharmacist  Pager: [phone]

## 2015-08-28 ENCOUNTER — Inpatient Hospital Stay (HOSPITAL_COMMUNITY): Payer: Medicare Other | Admitting: Physical Therapy

## 2015-08-28 LAB — PROTIME-INR
INR: 1.86 — ABNORMAL HIGH (ref 0.00–1.49)
Prothrombin Time: 21.3 seconds — ABNORMAL HIGH (ref 11.6–15.2)

## 2015-08-28 LAB — GLUCOSE, CAPILLARY
GLUCOSE-CAPILLARY: 215 mg/dL — AB (ref 65–99)
GLUCOSE-CAPILLARY: 97 mg/dL (ref 65–99)
Glucose-Capillary: 115 mg/dL — ABNORMAL HIGH (ref 65–99)
Glucose-Capillary: 97 mg/dL (ref 65–99)

## 2015-08-28 LAB — OCCULT BLOOD X 1 CARD TO LAB, STOOL: FECAL OCCULT BLD: NEGATIVE

## 2015-08-28 MED ORDER — NAPHAZOLINE-GLYCERIN 0.012-0.2 % OP SOLN
1.0000 [drp] | Freq: Four times a day (QID) | OPHTHALMIC | Status: DC | PRN
  Filled 2015-08-28: qty 15

## 2015-08-28 MED ORDER — WARFARIN SODIUM 5 MG PO TABS
5.0000 mg | ORAL_TABLET | Freq: Once | ORAL | Status: AC
  Administered 2015-08-28: 5 mg via ORAL
  Filled 2015-08-28: qty 1

## 2015-08-28 NOTE — Progress Notes (Signed)
Assumed pt care. Pt resting in bed with eyes closed. Respirations even and unlabored. No distress noted. Call bell within reach.

## 2015-08-28 NOTE — Progress Notes (Signed)
Physical Therapy Note  Patient Details  Name: MATS HEASTER MRN: [id number] Date of Birth: Nov 18, 1940 Today's Date: 08/28/2015    Time: 1000-1030 30 minutes  1:1 No c/o pain. Pt propelled w/c with supervision in controlled environment 150' x 2. Gait with RW with supervision 110', gait with obstacle negotiation for stepping over and around obstacles with RW and supervision.  Standing balance with min A toe taps forward and sideways, wt shifts and head turns.  Pt fatigues easily and requires frequent rests during session.   Oshen Wlodarczyk 08/28/2015, 10:31 AM

## 2015-08-28 NOTE — Progress Notes (Signed)
[place] PHYSICAL MEDICINE & REHABILITATION     PROGRESS NOTE  Subjective/Complaints: No complaints except for eyes itching a bit.  ROS: Denies CP, SOB, N/V/D.  Objective: Vital Signs: Blood pressure 113/62, pulse 84, temperature 97.5 F (36.4 C), temperature source Oral, resp. rate 18, weight 77 kg (169 lb 12.1 oz), SpO2 94 %. No results found. No results for input(s): WBC, HGB, HCT, PLT in the last 72 hours. No results for input(s): NA, K, CL, GLUCOSE, BUN, CREATININE, CALCIUM in the last 72 hours.  Invalid input(s): CO CBG (last 3)   Recent Labs  08/27/15 1640 08/27/15 2128 08/28/15 0638  GLUCAP 149* 100* 115*    Wt Readings from Last 3 Encounters:  08/27/15 77 kg (169 lb 12.1 oz)  08/17/15 75.3 kg (166 lb 0.1 oz)  08/03/15 80.1 kg (176 lb 9.4 oz)    Physical Exam:  Constitutional: He appears well-developed and well-nourished. No acute distress. Vital signs reviewed HENT: Normocephalic. Atraumatic Eyes: Conjunctivae and EOM are normal. No drainage or irritation.  Neck: Normal range of motion. Neck supple.  Cardiovascular: Normal rate. An irregularly irregular rhythm present.  Respiratory: Effort normal and breath sounds normal. No respiratory distress. He has no wheezes. He exhibits no tenderness.  GI: He exhibits no distension. There is no tenderness.  Musculoskeletal: He exhibits no edema or tenderness.  Neurological: He is alert and oriented 3. Appropriate with conversation. B/l UE 5/5 prox to distal.  B/l LE: 4 -/5 hip flexion, knee extension 4 -/5 ankle dorsi/plantarflexion Skin: Skin is warm and dry. He is not diaphoretic.  Dry  skin.  Back incision C/D/I   Psychiatric: He has a normal mood and affect. His behavior is normal.   Assessment/Plan: 1. 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 Physiatrist is  providing close team supervision and 24 hour management of active medical problems listed below. Physiatrist and rehab team continue to assess barriers to discharge/monitor patient progress toward functional and medical goals.  Function:  Bathing Bathing position Bathing activity did not occur: Refused Position: Production manager parts bathed by patient: Right arm, Left arm, Chest, Abdomen, Front perineal area, Buttocks, Right upper leg, Left upper leg, Right lower leg, Left lower leg Body parts bathed by helper: Back  Bathing assist Assist Level: Supervision or verbal cues      Upper Body Dressing/Undressing Upper body dressing   What is the patient wearing?: Pull over shirt/dress     Pull over shirt/dress - Perfomed by patient: Thread/unthread right sleeve, Thread/unthread left sleeve, Put head through opening, Pull shirt over trunk          Upper body assist Assist Level: Supervision or verbal cues   Set up : To obtain clothing/put away  Lower Body Dressing/Undressing Lower body dressing   What is the patient wearing?: Underwear, Pants, Shoes, [employer]  - Performed by patient: Thread/unthread right underwear leg, Pull underwear up/down, Thread/unthread left underwear leg Underwear - Performed by helper: Thread/unthread right underwear leg, Thread/unthread left underwear leg Pants- Performed by patient: Thread/unthread left pants leg, Pull pants up/down, Thread/unthread right pants leg, Fasten/unfasten pants Pants- Performed by helper: Thread/unthread right pants leg Non-skid slipper socks- Performed by patient: Don/doff right sock, Don/doff left sock Non-skid slipper socks- Performed by helper: Don/doff right sock, Don/doff left sock Socks - Performed by patient: Don/doff right sock, Don/doff left sock   Shoes -  Performed by patient: Don/doff right shoe, Don/doff left shoe Shoes - Performed by helper: Don/doff right shoe       TED Hose - Performed by  helper: Don/doff right TED hose, Don/doff left TED hose  Lower body assist Assist for lower body dressing: Touching or steadying assistance (Pt > 75%)      Toileting Toileting   Toileting steps completed by patient: Performs perineal hygiene, Adjust clothing prior to toileting, Adjust clothing after toileting Toileting steps completed by helper: Adjust clothing prior to toileting, Performs perineal hygiene, Adjust clothing after toileting Toileting Assistive Devices: Grab bar or rail  Toileting assist Assist level: No help/no cues   Transfers Chair/bed transfer   Chair/bed transfer method: Ambulatory, Stand pivot Chair/bed transfer assist level: Touching or steadying assistance (Pt > 75%) Chair/bed transfer assistive device: Armrests, Medical sales representative     Max distance: 110 Assist level: Touching or steadying assistance (Pt > 75%)   Wheelchair   Type: Manual Max wheelchair distance: 200 Assist Level: Supervision or verbal cues  Cognition Comprehension Comprehension assist level: Understands basic 90% of the time/cues < 10% of the time  Expression Expression assist level: Expresses complex 90% of the time/cues < 10% of the time  Social Interaction Social Interaction assist level: Interacts appropriately 90% of the time - Needs monitoring or encouragement for participation or interaction.  Problem Solving Problem solving assist level: Solves basic 75 - 89% of the time/requires cueing 10 - 24% of the time  Memory Memory assist level: Recognizes or recalls 75 - 89% of the time/requires cueing 10 - 24% of the time   Medical Problem List and Plan: 1. 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 DVT Prophylaxis/Anticoagulation: Pharmaceutical: Coumadin per pharmacy protocol.  .    3. Chronic back pain/Pain Management: tylenol only at present 4. Mood: LCSW to  follow for evaluation and support.  5. Neuropsych: This patient is not yet capable of making decisions on his own behalf. 6. Skin/Wound Care: Dry dressing daily. Monitor wound for healing. Added protein supplement to help with low protein stores 7. Fluids/Electrolytes/Nutrition: Continue IVF at night to maintain adequate hydration status.  8. Acute renal failure: improved. Encourage po. On regular diet  -Cr 1.17 on 3/14.    -IVF DC'd, follow up labs  9. A fib with RVR: Monitor HR bid. On amiodarone and coumadin --monitor for any symptoms with activity.  10. PMR/RA: Has completed stress dose steroids--now on maintainance dose.  11. Anemia of chronic illness/iron deficiency: On iron supplement. hgb 11.2 12. GERD: protonix 13. DM2: Currently on novolog 3u bid with breakfast and lunch.   -used glucotrol 5 mg daily at home--resumed with good control  -off novolog.  14. CAD: Off ASA/plavix-- now on coumadin and lipitor.  15. Dysphagia: regular and thins  -recent bmet normal         16. Cough: no signs this morning  -encourage IS, OOB.   LOS (Days) 11 A FACE TO FACE EVALUATION WAS PERFORMED  Lokelani Lutes T 08/28/2015 11:29 AM

## 2015-08-28 NOTE — Progress Notes (Signed)
ANTICOAGULATION CONSULT NOTE - Follow Up Consult  Pharmacy Consult for Coumadin Indication: atrial fibrillation  Allergies  Allergen Reactions  . Ace Inhibitors Other (See Comments)    Causes high K  . Angiotensin Receptor Blockers Other (See Comments)    Causes high K  . Metoprolol Other (See Comments)    May cause elevated K level  . Penicillins Swelling and Rash  . Tetracycline Swelling and Rash    Patient Measurements: Weight: 169 lb 12.1 oz (77 kg) Heparin Dosing Weight:   Vital Signs: Temp: 98.2 F (36.8 C) (03/18 1307) Temp Source: Oral (03/18 1307) BP: 108/54 mmHg (03/18 1307) Pulse Rate: 92 (03/18 1307)  Labs:  Recent Labs  08/26/15 0552 08/27/15 0534 08/28/15 0554  LABPROT 19.3* 21.8* 21.3*  INR 1.62* 1.91* 1.86*    Estimated Creatinine Clearance: 52 mL/min (by C-G formula based on Cr of 1.17).   Assessment: 75yo male on Coumadin for AFib> DCCV to SR on amio. Continue  Lovenox until INR therapeutic.  INR 1.91 > 1.86 this AM will give boost , no CBC today, no bleeding per chart.  Goal of Therapy:  INR 2-3 Monitor platelets by anticoagulation protocol: Yes   Plan:  Coumadin 5mg  today Continue Lovenox Daily INR Watch for s/s of bleeding  Bonnita Nasuti Pharm.D. CPP, BCPS Clinical Pharmacist [phone] 08/28/2015 2:10 PM

## 2015-08-29 ENCOUNTER — Inpatient Hospital Stay (HOSPITAL_COMMUNITY): Payer: Medicare Other | Admitting: Occupational Therapy

## 2015-08-29 LAB — OCCULT BLOOD X 1 CARD TO LAB, STOOL: Fecal Occult Bld: NEGATIVE

## 2015-08-29 LAB — PROTIME-INR
INR: 1.97 — ABNORMAL HIGH (ref 0.00–1.49)
PROTHROMBIN TIME: 22.3 s — AB (ref 11.6–15.2)

## 2015-08-29 LAB — GLUCOSE, CAPILLARY
GLUCOSE-CAPILLARY: 137 mg/dL — AB (ref 65–99)
GLUCOSE-CAPILLARY: 153 mg/dL — AB (ref 65–99)
Glucose-Capillary: 128 mg/dL — ABNORMAL HIGH (ref 65–99)
Glucose-Capillary: 82 mg/dL (ref 65–99)

## 2015-08-29 MED ORDER — WARFARIN SODIUM 3 MG PO TABS
3.0000 mg | ORAL_TABLET | Freq: Every day | ORAL | Status: DC
  Administered 2015-08-29: 3 mg via ORAL
  Filled 2015-08-29 (×2): qty 1

## 2015-08-29 NOTE — Progress Notes (Addendum)
Occupational Therapy Session Note  Patient Details  Name: Curtis Frazier MRN: [id number] Date of Birth: 1940/09/10  Today's Date: 08/29/2015 OT Individual Time: 1430-1530 OT Individual Time Calculation (min): 60 min    Short Term Goals: Week 1:  OT Short Term Goal 1 (Week 1): Pt will be able to tolerate standing for at least 2 minutes without changes in vital signs to be more independent with toileting. OT Short Term Goal 1 - Progress (Week 1): Met OT Short Term Goal 2 (Week 1): Pt will complete toileting with steady A. OT Short Term Goal 2 - Progress (Week 1): Met OT Short Term Goal 3 (Week 1): Pt will use AE to don LB clothing with min A. OT Short Term Goal 3 - Progress (Week 1): Met OT Short Term Goal 4 (Week 1): Pt will use long sponge to wash feet. OT Short Term Goal 4 - Progress (Week 1): Met OT Short Term Goal 5 (Week 1): Pt will complete smooth stand to sit with steadying A. OT Short Term Goal 5 - Progress (Week 1): Met Week 2:  OT Short Term Goal 1 (Week 2): STG=LTG secondary to ELOS  Skilled Therapeutic Interventions/Progress Updates:    Engaged in therapeutic activities using ambulation, balance, sit to stand, and cognition.  Pt ambulated around in day room while playing word game.  He did sit to stand x 10 with one arm on arm rest and no cues or assistance.  He followed the directions and linear thinking with activity.  No LOB and mild signs of fatique at end of session.  Pt. Left with call bell, phone and all needs in reach after returning to room.  .     Therapy Documentation Precautions:  Precautions Precautions: Fall, Back Precaution Comments: Recent spine surgery 2/2 Restrictions Weight Bearing Restrictions: No       Pain:   none   ADL: ADL ADL Comments: Refer to functional navigator        See Function Navigator for Current Functional Status.   Therapy/Group: Group Therapy  Lisa Roca 08/29/2015, 3:46 PM

## 2015-08-29 NOTE — Progress Notes (Signed)
ANTICOAGULATION CONSULT NOTE - Follow Up Consult  Pharmacy Consult for Coumadin Indication: atrial fibrillation  Allergies  Allergen Reactions  . Ace Inhibitors Other (See Comments)    Causes high K  . Angiotensin Receptor Blockers Other (See Comments)    Causes high K  . Metoprolol Other (See Comments)    May cause elevated K level  . Penicillins Swelling and Rash  . Tetracycline Swelling and Rash    Patient Measurements: Weight: 168 lb 11.2 oz (76.522 kg) Heparin Dosing Weight:   Vital Signs: Temp: 98 F (36.7 C) (03/19 1307) Temp Source: Oral (03/19 1307) BP: 102/56 mmHg (03/19 1307) Pulse Rate: 92 (03/19 1307)  Labs:  Recent Labs  08/27/15 0534 08/28/15 0554 08/29/15 0554  LABPROT 21.8* 21.3* 22.3*  INR 1.91* 1.86* 1.97*    Estimated Creatinine Clearance: 52 mL/min (by C-G formula based on Cr of 1.17).   Assessment: 75yo male on Coumadin for AFib> DCCV to SR on amio. Continue  Lovenox until INR therapeutic> 2.  INR 1.91 > 1.86> 1.97 after boost 5mg  last pm. INR rounds up to 2 but because of recent DCCV will continue enoxaparin until INR > 2 No CBC today, no bleeding per chart.  Goal of Therapy:  INR 2-3 Monitor platelets by anticoagulation protocol: Yes   Plan:  Coumadin 3mg  daily Continue Lovenox Daily INR Watch for s/s of bleeding  Bonnita Nasuti Pharm.D. CPP, BCPS Clinical Pharmacist [phone] 08/29/2015 1:12 PM

## 2015-08-29 NOTE — Progress Notes (Signed)
[place] PHYSICAL MEDICINE & REHABILITATION     PROGRESS NOTE  Subjective/Complaints: Had a good night. No complaints today. Feeling well  ROS: Denies CP, SOB, N/V/D.  Objective: Vital Signs: Blood pressure 108/54, pulse 92, temperature 98.6 F (37 C), temperature source Oral, resp. rate 18, weight 76.522 kg (168 lb 11.2 oz), SpO2 95 %. No results found. No results for input(s): WBC, HGB, HCT, PLT in the last 72 hours. No results for input(s): NA, K, CL, GLUCOSE, BUN, CREATININE, CALCIUM in the last 72 hours.  Invalid input(s): CO CBG (last 3)   Recent Labs  08/28/15 1636 08/28/15 2110 08/29/15 0720  GLUCAP 215* 97 128*    Wt Readings from Last 3 Encounters:  08/29/15 76.522 kg (168 lb 11.2 oz)  08/17/15 75.3 kg (166 lb 0.1 oz)  08/03/15 80.1 kg (176 lb 9.4 oz)    Physical Exam:  Constitutional: He appears well-developed and well-nourished. No acute distress. Vital signs reviewed HENT: Normocephalic. Atraumatic Eyes: Conjunctivae and EOM are normal. No drainage or irritation.  Neck: Normal range of motion. Neck supple.  Cardiovascular: Normal rate. An irregularly irregular rhythm present.  Respiratory: Effort normal and breath sounds normal. No respiratory distress. He has no wheezes. He exhibits no tenderness.  GI: He exhibits no distension. There is no tenderness.  Musculoskeletal: He exhibits no edema or tenderness.  Neurological: He is alert and oriented 3. Appropriate with conversation. B/l UE 5/5 prox to distal.  B/l LE: 4 -/5 hip flexion, knee extension 4 -/5 ankle dorsi/plantarflexion Skin: Skin is warm and dry. He is not diaphoretic.  Dry  skin.  Back incision healed   Psychiatric: He has a normal mood and affect. His behavior is normal.   Assessment/Plan: 1. 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 Physiatrist is providing close team supervision and 24 hour management of active medical problems listed below. Physiatrist and rehab team continue to assess barriers to discharge/monitor patient progress toward functional and medical goals.  Function:  Bathing Bathing position Bathing activity did not occur: Refused Position: Production manager parts bathed by patient: Right arm, Left arm, Chest, Abdomen, Front perineal area, Buttocks, Right upper leg, Left upper leg, Right lower leg, Left lower leg Body parts bathed by helper: Back  Bathing assist Assist Level: Supervision or verbal cues      Upper Body Dressing/Undressing Upper body dressing   What is the patient wearing?: Pull over shirt/dress     Pull over shirt/dress - Perfomed by patient: Thread/unthread right sleeve, Thread/unthread left sleeve, Put head through opening, Pull shirt over trunk          Upper body assist Assist Level: Supervision or verbal cues   Set up : To obtain clothing/put away  Lower Body Dressing/Undressing Lower body dressing   What is the patient wearing?: Underwear, Pants, Shoes, [employer]  - Performed by patient: Thread/unthread right underwear leg, Pull underwear up/down, Thread/unthread left underwear leg Underwear - Performed by helper: Thread/unthread right underwear leg, Thread/unthread left underwear leg Pants- Performed by patient: Thread/unthread left pants leg, Pull pants up/down, Thread/unthread right pants leg, Fasten/unfasten pants Pants- Performed by helper: Thread/unthread right pants leg Non-skid slipper socks- Performed by patient: Don/doff right sock, Don/doff left sock Non-skid slipper socks- Performed by helper: Don/doff right sock, Don/doff left sock Socks - Performed by patient: Don/doff right sock, Don/doff left sock   Shoes -  Performed by patient: Don/doff right shoe, Don/doff left shoe Shoes - Performed by helper: Don/doff right shoe        TED Hose - Performed by helper: Don/doff right TED hose, Don/doff left TED hose  Lower body assist Assist for lower body dressing: Touching or steadying assistance (Pt > 75%)      Toileting Toileting   Toileting steps completed by patient: Performs perineal hygiene, Adjust clothing prior to toileting, Adjust clothing after toileting Toileting steps completed by helper: Adjust clothing prior to toileting, Performs perineal hygiene, Adjust clothing after toileting Toileting Assistive Devices: Grab bar or rail  Toileting assist Assist level: No help/no cues   Transfers Chair/bed transfer   Chair/bed transfer method: Ambulatory, Stand pivot Chair/bed transfer assist level: Touching or steadying assistance (Pt > 75%) Chair/bed transfer assistive device: Armrests, Medical sales representative     Max distance: 110 Assist level: Touching or steadying assistance (Pt > 75%)   Wheelchair   Type: Manual Max wheelchair distance: 200 Assist Level: Supervision or verbal cues  Cognition Comprehension Comprehension assist level: Understands basic 90% of the time/cues < 10% of the time  Expression Expression assist level: Expresses complex 90% of the time/cues < 10% of the time  Social Interaction Social Interaction assist level: Interacts appropriately 90% of the time - Needs monitoring or encouragement for participation or interaction.  Problem Solving Problem solving assist level: Solves basic 75 - 89% of the time/requires cueing 10 - 24% of the time  Memory Memory assist level: Recognizes or recalls 75 - 89% of the time/requires cueing 10 - 24% of the time   Medical Problem List and Plan: 1. 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 DVT Prophylaxis/Anticoagulation: Pharmaceutical: Coumadin per pharmacy protocol.  .    3. Chronic back pain/Pain Management: tylenol only at  present 4. Mood: LCSW to follow for evaluation and support.  5. Neuropsych: This patient is not yet capable of making decisions on his own behalf. 6. Skin/Wound Care: Dry dressing daily. Monitor wound for healing. Added protein supplement to help with low protein stores 7. Fluids/Electrolytes/Nutrition: Continue IVF at night to maintain adequate hydration status.  8. Acute renal failure: improved. Encourage po. On regular diet  -Cr 1.17 on 3/14.    -off IVF 9. A fib with RVR: Monitor HR bid. On amiodarone and coumadin --monitor for any symptoms with activity.  10. PMR/RA: Has completed stress dose steroids--now on maintainance dose.  11. Anemia of chronic illness/iron deficiency: On iron supplement. hgb 11.2 12. GERD: protonix 13. DM2: Currently on novolog 3u bid with breakfast and lunch.   -used glucotrol 5 mg daily at home--resumed with good control in general  -off novolog at this point.  14. CAD: Off ASA/plavix-- now on coumadin and lipitor.  15. Dysphagia: regular and thins  -recent bmet normal         16. Cough: no signs this morning  -encourage IS, OOB.   LOS (Days) 12 A FACE TO FACE EVALUATION WAS PERFORMED  SWARTZ,ZACHARY T 08/29/2015 10:22 AM

## 2015-08-30 ENCOUNTER — Inpatient Hospital Stay (HOSPITAL_COMMUNITY): Payer: Medicare Other | Admitting: Physical Therapy

## 2015-08-30 ENCOUNTER — Inpatient Hospital Stay (HOSPITAL_COMMUNITY): Payer: Medicare Other

## 2015-08-30 ENCOUNTER — Inpatient Hospital Stay (HOSPITAL_COMMUNITY): Payer: Medicare Other | Admitting: Speech Pathology

## 2015-08-30 LAB — PROTIME-INR
INR: 1.93 — AB (ref 0.00–1.49)
PROTHROMBIN TIME: 21.9 s — AB (ref 11.6–15.2)

## 2015-08-30 LAB — GLUCOSE, CAPILLARY
GLUCOSE-CAPILLARY: 103 mg/dL — AB (ref 65–99)
Glucose-Capillary: 123 mg/dL — ABNORMAL HIGH (ref 65–99)
Glucose-Capillary: 113 mg/dL — ABNORMAL HIGH (ref 65–99)
Glucose-Capillary: 199 mg/dL — ABNORMAL HIGH (ref 65–99)

## 2015-08-30 MED ORDER — WARFARIN SODIUM 4 MG PO TABS
4.0000 mg | ORAL_TABLET | Freq: Every day | ORAL | Status: DC
  Administered 2015-08-30: 4 mg via ORAL
  Filled 2015-08-30 (×2): qty 1

## 2015-08-30 MED ORDER — PRO-STAT SUGAR FREE PO LIQD
30.0000 mL | Freq: Two times a day (BID) | ORAL | Status: DC
  Administered 2015-08-30 – 2015-09-02 (×6): 30 mL via ORAL
  Filled 2015-08-30 (×6): qty 30

## 2015-08-30 NOTE — Progress Notes (Signed)
[place] PHYSICAL MEDICINE & REHABILITATION     PROGRESS NOTE  Subjective/Complaints: Just waking up. Mild back pain which is manageable. Feels he's getting stronger  ROS: Denies CP, SOB, N/V/D.  Objective: Vital Signs: Blood pressure 106/67, pulse 94, temperature 98.4 F (36.9 C), temperature source Oral, resp. rate 18, weight 76.295 kg (168 lb 3.2 oz), SpO2 95 %. No results found. No results for input(s): WBC, HGB, HCT, PLT in the last 72 hours. No results for input(s): NA, K, CL, GLUCOSE, BUN, CREATININE, CALCIUM in the last 72 hours.  Invalid input(s): CO CBG (last 3)   Recent Labs  08/29/15 1639 08/29/15 2057 08/30/15 0717  GLUCAP 137* 153* 123*    Wt Readings from Last 3 Encounters:  08/30/15 76.295 kg (168 lb 3.2 oz)  08/17/15 75.3 kg (166 lb 0.1 oz)  08/03/15 80.1 kg (176 lb 9.4 oz)    Physical Exam:  Constitutional: He appears well-developed and well-nourished. No acute distress. Vital signs reviewed HENT: Normocephalic. Atraumatic Eyes: Conjunctivae and EOM are normal. No drainage or irritation.  Neck: Normal range of motion. Neck supple.  Cardiovascular: Normal rate. An irregularly irregular rhythm present. No murmur Respiratory: Effort normal and breath sounds normal. No respiratory distress. He has no wheezes. He exhibits no tenderness.  GI: He exhibits no distension. There is no tenderness.  Musculoskeletal: He exhibits no edema or tenderness.  Neurological: He is alert and oriented 3. Appropriate with conversation. Reasonable insight B/l UE 5/5 prox to distal.  B/l LE: 4 -/5 hip flexion, knee extension 4 /5 ankle dorsi/plantarflexion Skin: Skin is warm and dry. He is not diaphoretic.  Dry  skin.  Back incision healed   Psychiatric: He has a normal mood and affect. His behavior is normal.   Assessment/Plan: 1. 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 Physiatrist is providing close team supervision and 24 hour management of active medical problems listed below. Physiatrist and rehab team continue to assess barriers to discharge/monitor patient progress toward functional and medical goals.  Function:  Bathing Bathing position Bathing activity did not occur: Refused Position: Production manager parts bathed by patient: Right arm, Left arm, Chest, Abdomen, Front perineal area, Buttocks, Right upper leg, Left upper leg, Right lower leg, Left lower leg Body parts bathed by helper: Back  Bathing assist Assist Level: Supervision or verbal cues      Upper Body Dressing/Undressing Upper body dressing   What is the patient wearing?: Pull over shirt/dress     Pull over shirt/dress - Perfomed by patient: Thread/unthread right sleeve, Thread/unthread left sleeve, Put head through opening, Pull shirt over trunk          Upper body assist Assist Level: Supervision or verbal cues   Set up : To obtain clothing/put away  Lower Body Dressing/Undressing Lower body dressing   What is the patient wearing?: Underwear, Pants, Shoes, [employer]  - Performed by patient: Thread/unthread right underwear leg, Pull underwear up/down, Thread/unthread left underwear leg Underwear - Performed by helper: Thread/unthread right underwear leg, Thread/unthread left underwear leg Pants- Performed by patient: Thread/unthread left pants leg, Pull pants up/down, Thread/unthread right pants leg, Fasten/unfasten pants Pants- Performed by helper: Thread/unthread right pants leg Non-skid slipper socks- Performed by patient: Don/doff right sock, Don/doff left sock Non-skid slipper socks- Performed by helper: Don/doff right sock, Don/doff left sock Socks - Performed by patient: Don/doff right  sock, Don/doff left sock   Shoes - Performed by patient: Don/doff right shoe, Don/doff left shoe Shoes - Performed  by helper: Don/doff right shoe       TED Hose - Performed by helper: Don/doff right TED hose, Don/doff left TED hose  Lower body assist Assist for lower body dressing: Touching or steadying assistance (Pt > 75%)      Toileting Toileting   Toileting steps completed by patient: Performs perineal hygiene, Adjust clothing prior to toileting, Adjust clothing after toileting Toileting steps completed by helper: Adjust clothing prior to toileting, Performs perineal hygiene, Adjust clothing after toileting Toileting Assistive Devices: Grab bar or rail  Toileting assist Assist level: No help/no cues   Transfers Chair/bed transfer   Chair/bed transfer method: Ambulatory, Stand pivot Chair/bed transfer assist level: Touching or steadying assistance (Pt > 75%) Chair/bed transfer assistive device: Armrests, Medical sales representative     Max distance: 110 Assist level: Touching or steadying assistance (Pt > 75%)   Wheelchair   Type: Manual Max wheelchair distance: 200 Assist Level: Supervision or verbal cues  Cognition Comprehension Comprehension assist level: Understands basic 90% of the time/cues < 10% of the time  Expression Expression assist level: Expresses complex 90% of the time/cues < 10% of the time  Social Interaction Social Interaction assist level: Interacts appropriately 90% of the time - Needs monitoring or encouragement for participation or interaction.  Problem Solving Problem solving assist level: Solves basic 75 - 89% of the time/requires cueing 10 - 24% of the time  Memory Memory assist level: Recognizes or recalls 75 - 89% of the time/requires cueing 10 - 24% of the time   Medical Problem List and Plan: 1. 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 DVT Prophylaxis/Anticoagulation: Pharmaceutical: Coumadin per pharmacy  protocol.  .    3. Chronic back pain/Pain Management: tylenol only at present 4. Mood: LCSW to follow for evaluation and support.  5. Neuropsych: This patient is not yet capable of making decisions on his own behalf. 6. Skin/Wound Care: Dry dressing daily. Monitor wound for healing. Added protein supplement to help with low protein stores 7. Fluids/Electrolytes/Nutrition: Continue IVF at night to maintain adequate hydration status.  8. Acute renal failure: improved. Encourage po. On regular diet  -Cr 1.17 on 3/14.    -off IVF  -recheck labs tomorrow 9. A fib with RVR: Monitor HR bid. On amiodarone and coumadin --monitor for any symptoms with activity.  10. PMR/RA: Has completed stress dose steroids--now on maintainance dose.  11. Anemia of chronic illness/iron deficiency: On iron supplement. hgb 11.2 12. GERD: protonix 13. DM2: Currently on novolog 3u bid with breakfast and lunch.   -used glucotrol 5 mg daily at home--resumed with good control in general  -off novolog at this point as well. 14. CAD: Off ASA/plavix-- now on coumadin and lipitor.  15. Dysphagia: regular and thins          16. Cough: no signs this morning  -encourage IS, OOB.   LOS (Days) 13 A FACE TO FACE EVALUATION WAS PERFORMED  Oma Alpert T 08/30/2015 7:25 AM

## 2015-08-30 NOTE — Progress Notes (Signed)
Speech Language Pathology Daily Session Notes  Patient Details  Name: Curtis Frazier MRN: [id number] Date of Birth: [birth year]  Today's Date: 08/30/2015  Session 1: SLP Individual Time: [id number] SLP Individual Time Calculation (min): 30 min   Session 2: SLP Individual Time: [id number] SLP Individual Time Calculation (min): 60 min   Short Term Goals: Week 2: SLP Short Term Goal 1 (Week 2): Patient will consume current diet with minimal overt s/s of aspiration with Min A verbal cues for use of swallowing compensatory strategies.  SLP Short Term Goal 2 (Week 2): Patient will perform pharyngeal strengthening exercises with Mod I for accuracy.  SLP Short Term Goal 3 (Week 2): Patient will utilize external memory aids to recall new, daily information with supervision verbal and questioning cues.  SLP Short Term Goal 4 (Week 2): Patient will demonstrate selective attention to a functional task in a mildly distracting enviornment for 60 minutes with supervision  verbal cues for redirection.   Skilled Therapeutic Interventions:  Session 1: Skilled treatment session focused on dysphagia goals. SLP facilitated session by providing skilled observation with lunch meal of regular textures with thin liquids. Patient consumed meal without overt s/s of aspiration and was Mod I for use of swallowing strategies in a moderately distracting environment, therefore, recommend patient only require intermittent supervision at this time. Both the patient and his daughter verbalize understanding and agreement. Patient left upright in wheelchair with all needs within reach. Continue with current plan of care.   Session 2: Skilled treatment session focused on cognitive goals. Upon arrival, patient was awake while supine in bed and requested to use the bathroom. Patient ambulated to the bathroom with the RW with Min verbal cues for safety with task. SLP facilitated session by re-administering the MoCA (version 7.2). Patient  scored 24/30 points with a score of 26 or above considered normal compared to 21/30 points on initial assessment on 3/8. Patient continues to demonstrate impairments in the areas of attention and recall. SLP also facilitated session by providing Mod-Max A verbal cues for functional problem solving and organization during a mildly complex money management task. Patient left upright in wheelchair with all needs within reach and family present. Continue with current plan of care.   Function:  Eating Eating   Modified Consistency Diet: No Eating Assist Level: Swallowing techniques: self managed   Eating Set Up Assist For: Opening containers       Cognition Comprehension Comprehension assist level: Understands basic 90% of the time/cues < 10% of the time  Expression   Expression assist level: Expresses complex 90% of the time/cues < 10% of the time  Social Interaction Social Interaction assist level: Interacts appropriately 90% of the time - Needs monitoring or encouragement for participation or interaction.  Problem Solving Problem solving assist level: Solves basic 75 - 89% of the time/requires cueing 10 - 24% of the time  Memory Memory assist level: Recognizes or recalls 75 - 89% of the time/requires cueing 10 - 24% of the time    Pain Pain Assessment Pain Score: 0-No pain  Therapy/Group: Individual Therapy  Alyssandra Hulsebus 08/30/2015, 3:53 PM

## 2015-08-30 NOTE — Progress Notes (Signed)
ANTICOAGULATION CONSULT NOTE - Follow Up Consult  Pharmacy Consult for Coumadin Indication: atrial fibrillation  Allergies  Allergen Reactions  . Ace Inhibitors Other (See Comments)    Causes high K  . Angiotensin Receptor Blockers Other (See Comments)    Causes high K  . Metoprolol Other (See Comments)    May cause elevated K level  . Penicillins Swelling and Rash  . Tetracycline Swelling and Rash    Patient Measurements: Weight: 168 lb 3.2 oz (76.295 kg) Heparin Dosing Weight:   Vital Signs: Temp: 98.4 F (36.9 C) (03/20 0653) Temp Source: Oral (03/20 0653) BP: 106/67 mmHg (03/20 0653) Pulse Rate: 94 (03/20 0653)  Labs:  Recent Labs  08/28/15 0554 08/29/15 0554 08/30/15 0635  LABPROT 21.3* 22.3* 21.9*  INR 1.86* 1.97* 1.93*    Estimated Creatinine Clearance: 52 mL/min (by C-G formula based on Cr of 1.17).   Assessment: 75yo male on Coumadin for AFib> DCCV to SR on amio. Continue Lovenox until INR therapeutic. INR 1.93 today. INR rounds up to 2 but because of recent DCCV will continue enoxaparin until INR > 2 No CBC today, no bleeding per chart.  Goal of Therapy:  INR 2-3 Monitor platelets by anticoagulation protocol: Yes   Plan:  Coumadin 4mg  daily Continue Lovenox Daily INR Watch for s/s of bleeding  Maryanna Shape, PharmD, BCPS  Clinical Pharmacist  Pager: [phone]   08/30/2015 10:34 AM

## 2015-08-30 NOTE — Progress Notes (Signed)
Physical Therapy Session Note  Patient Details  Name: Curtis Frazier MRN: [id number] Date of Birth: Jan 02, 1941  Today's Date: 08/30/2015 PT Individual Time: 1000-1100 PT Individual Time Calculation (min): 60 min   Short Term Goals: Week 2:  PT Short Term Goal 1 (Week 2): = LTGs due to anticipated LOS  Therapy Documentation Precautions:  Precautions Precautions: Fall, Back Precaution Comments: Recent spine surgery 2/2 Restrictions Weight Bearing Restrictions: No   Pain: Pain Assessment Pain Assessment: 0-10 Pain Score: 5  Pain Type: Acute pain Pain Location: Hip Pain Orientation: Right Pain Descriptors / Indicators: Aching Pain Frequency: Constant Pain Onset: With Activity Pain Intervention(s): Medication (See eMAR)  Patient initially unable to recal SPinal precautions. Reviewed and educated patient on precautions and was able to recall at end of session will continue to reinforce.   Sit to and from stand transfer with RW close supervision.  Patient ambulated 150 feetx3 feet with RW close supervision. Patient demonstrated varying cadence and step length. Patient required verbal cues for pacing and environmental awareness and obstacle negotiation. Verbal cues to maintain spinal precautions.   Standing ther ex: B heel raises 3x10 Mini-squats 30x B hip abduction 3x10   Patient up and down 4 steps with left handrail min assist. Patient performed stairs with a step to pattern. Patient educated on proper sequence and technique.    Car transfer with RW min assist. Verbal cues for sequencing , technique and to maintain spinal precautions.   Patient returned to room at end of session resting in wheelchair with all needs met. Patient education not to be up without assistance and verbalized understanding. Family member present toward end of session.   See Function Navigator for Current Functional Status.   Therapy/Group: Individual Therapy  Retta Diones 08/30/2015, 12:42  PM

## 2015-08-30 NOTE — Progress Notes (Signed)
Occupational Therapy Session Note  Patient Details  Name: Curtis Frazier MRN: [id number] Date of Birth: 08/08/1940  Today's Date: 08/30/2015 OT Individual Time: [id number] OT Individual Time Calculation (min): 58 min    Short Term Goals: Week 2:  OT Short Term Goal 1 (Week 2): STG=LTG secondary to ELOS  Skilled Therapeutic Interventions/Progress Updates:    Pt resting in w/c upon arrival.  Pt requested to use toilet prior to taking a shower and amb with RW to toilet.  Pt transferred to shower and  Completed bathing with distant supervision.  Pt utilized long handle sponge to bathe back and feet.  Pt amb to room and completed dressing with sit<>stand from w/c and grooming tasks while seated at sink.  Pt continues to exhibit SOB with short periods of activity (30 seconds) and requires rest breaks throughout tasks.  Pt transitioned to ADL apartment and engaged in bed mobility tasks while adhering to back precautions.  Pt also practiced stepping over into tub after tub bar installed and tub seat in place.  Pt unable to step over edge of tub.  Will attempt again in tub room (lower tub edge). Pt propelled back to room and remained in w/c with all needs within reach.  Focus on activity tolerance, functional amb with RW, tub transfers, BADL retraining, standing balance, and safety awareness to increase independence with BADLs.  Therapy Documentation Precautions:  Precautions Precautions: Fall, Back Precaution Comments: Recent spine surgery 2/2 Restrictions Weight Bearing Restrictions: No Pain: Pain Assessment Pain Assessment: No/denies pain  See Function Navigator for Current Functional Status.   Therapy/Group: Individual Therapy  Leroy Libman 08/30/2015, 10:01 AM

## 2015-08-31 ENCOUNTER — Inpatient Hospital Stay (HOSPITAL_COMMUNITY): Payer: Medicare Other | Admitting: Physical Therapy

## 2015-08-31 ENCOUNTER — Inpatient Hospital Stay (HOSPITAL_COMMUNITY): Payer: Medicare Other

## 2015-08-31 ENCOUNTER — Inpatient Hospital Stay (HOSPITAL_COMMUNITY): Payer: Medicare Other | Admitting: Speech Pathology

## 2015-08-31 LAB — BASIC METABOLIC PANEL
ANION GAP: 8 (ref 5–15)
BUN: 20 mg/dL (ref 6–20)
CHLORIDE: 106 mmol/L (ref 101–111)
CO2: 24 mmol/L (ref 22–32)
CREATININE: 1.37 mg/dL — AB (ref 0.61–1.24)
Calcium: 8.9 mg/dL (ref 8.9–10.3)
GFR calc non Af Amer: 49 mL/min — ABNORMAL LOW (ref >60)
GFR, EST AFRICAN AMERICAN: 56 mL/min — AB (ref >60)
Glucose, Bld: 119 mg/dL — ABNORMAL HIGH (ref 65–99)
POTASSIUM: 4.7 mmol/L (ref 3.5–5.1)
Sodium: 138 mmol/L (ref 135–145)

## 2015-08-31 LAB — GLUCOSE, CAPILLARY
Glucose-Capillary: 107 mg/dL — ABNORMAL HIGH (ref 65–99)
Glucose-Capillary: 99 mg/dL (ref 65–99)
Glucose-Capillary: 192 mg/dL — ABNORMAL HIGH (ref 65–99)
Glucose-Capillary: 141 mg/dL — ABNORMAL HIGH (ref 65–99)

## 2015-08-31 LAB — PROTIME-INR
INR: 1.8 — ABNORMAL HIGH (ref 0.00–1.49)
Prothrombin Time: 20.8 seconds — ABNORMAL HIGH (ref 11.6–15.2)

## 2015-08-31 MED ORDER — WARFARIN SODIUM 5 MG PO TABS
5.0000 mg | ORAL_TABLET | Freq: Every day | ORAL | Status: DC
  Administered 2015-08-31 – 2015-09-01 (×2): 5 mg via ORAL
  Filled 2015-08-31 (×2): qty 1

## 2015-08-31 NOTE — Progress Notes (Signed)
Occupational Therapy Session Note  Patient Details  Name: JAYZIAH HUSKISSON MRN: [id number] Date of Birth: [birth year]  Today's Date: 08/31/2015 OT Individual Time: 0700-0800 OT Individual Time Calculation (min): 60 min    Short Term Goals: Week 2:  OT Short Term Goal 1 (Week 2): STG=LTG secondary to ELOS  Skilled Therapeutic Interventions/Progress Updates:    Pt asleep in bed upon arrival but easily aroused.  Pt engaged in BADL retraining including bathing at shower level and dressing with sit<>stand from w/c at sink.  Pt amb with RW into bathroom to access shower.  Pt completed all bathing and dressing tasks with use of AE appropriately at supervision level.  Pt continues to exhibit SOB and fatigues quickly with minimal exertion.  Continued discharge planning and safety recommendations.   Therapy Documentation Precautions:  Precautions Precautions: Fall, Back Precaution Comments: Recent spine surgery 2/2 Restrictions Weight Bearing Restrictions: No Pain:  Pt denied pain ADL: ADL ADL Comments: Refer to functional navigator   See Function Navigator for Current Functional Status.   Therapy/Group: Individual Therapy  Leroy Libman 08/31/2015, 8:26 AM

## 2015-08-31 NOTE — Progress Notes (Signed)
ANTICOAGULATION CONSULT NOTE - Follow Up Consult  Pharmacy Consult for Coumadin Indication: atrial fibrillation  Allergies  Allergen Reactions  . Ace Inhibitors Other (See Comments)    Causes high K  . Angiotensin Receptor Blockers Other (See Comments)    Causes high K  . Metoprolol Other (See Comments)    May cause elevated K level  . Penicillins Swelling and Rash  . Tetracycline Swelling and Rash    Patient Measurements: Weight: 168 lb 6.9 oz (76.4 kg) Heparin Dosing Weight:   Vital Signs: Temp: 98.1 F (36.7 C) (03/21 0551) Temp Source: Oral (03/21 0551) BP: 95/55 mmHg (03/21 0551) Pulse Rate: 86 (03/21 0551)  Labs:  Recent Labs  08/29/15 0554 08/30/15 0635 08/31/15 0610  LABPROT 22.3* 21.9* 20.8*  INR 1.97* 1.93* 1.80*  CREATININE  --   --  1.37*    Estimated Creatinine Clearance: 44.4 mL/min (by C-G formula based on Cr of 1.37).   Assessment: 75yo male on Coumadin for AFib> DCCV to SR on amio. Continue Lovenox until INR therapeutic. INR 1.80 today. INR close to 2 but because of recent DCCV will continue enoxaparin until INR > 2 No CBC today, no bleeding per chart. Pt is requiring higher coumadin dose then previously.   Goal of Therapy:  INR 2-3 Monitor platelets by anticoagulation protocol: Yes   Plan:  Increase Coumadin to 5mg  daily Continue Lovenox Daily INR Watch for s/s of bleeding  Maryanna Shape, PharmD, BCPS  Clinical Pharmacist  Pager: [phone]   08/31/2015 11:30 AM

## 2015-08-31 NOTE — Progress Notes (Signed)
Speech Language Pathology Daily Session Note  Patient Details  Name: Curtis Frazier MRN: [id number] Date of Birth: Jan 11, 1941  Today's Date: 08/31/2015 SLP Individual Time: [id number] SLP Individual Time Calculation (min): 60 min  Short Term Goals: Week 2: SLP Short Term Goal 1 (Week 2): Patient will consume current diet with minimal overt s/s of aspiration with Min A verbal cues for use of swallowing compensatory strategies.  SLP Short Term Goal 2 (Week 2): Patient will perform pharyngeal strengthening exercises with Mod I for accuracy.  SLP Short Term Goal 3 (Week 2): Patient will utilize external memory aids to recall new, daily information with supervision verbal and questioning cues.  SLP Short Term Goal 4 (Week 2): Patient will demonstrate selective attention to a functional task in a mildly distracting enviornment for 60 minutes with supervision  verbal cues for redirection.   Skilled Therapeutic Interventions: Skilled treatment session focused on cognitive goals. SLP facilitated session by providing Min-Mod A question cues for functional problem solving, organization and emergent awareness of errors during a mildly complex money management task. SLP also facilitated session by facilitating education with the patient and his daughter in regards to activities/tasks he can participate in safely at home after discharge. Patient participated in task with Mod A question cues for anticipatory awareness. Patient left upright in wheelchair with family present. Continue with current plan of care.    Function:  Cognition Comprehension Comprehension assist level: Understands basic 90% of the time/cues < 10% of the time  Expression   Expression assist level: Expresses complex 90% of the time/cues < 10% of the time  Social Interaction Social Interaction assist level: Interacts appropriately 90% of the time - Needs monitoring or encouragement for participation or interaction.  Problem Solving Problem  solving assist level: Solves basic 75 - 89% of the time/requires cueing 10 - 24% of the time  Memory Memory assist level: Recognizes or recalls 75 - 89% of the time/requires cueing 10 - 24% of the time    Pain No/Denies Pain   Therapy/Group: Individual Therapy  Shaundrea Carrigg 08/31/2015, 2:13 PM

## 2015-08-31 NOTE — Progress Notes (Signed)
[facility]ICAL [facility]IC[facility]ABILITATION     PROGRESS NOTE  Subjective/Complaints: Working with therapy. No new complaints.  ROS: Denies CP, SOB, N/V/D.  Objective: Vital Signs: Blood pressure 95/55, pulse 86, temperature 98.1 F (36.7 C), temperature source Oral, resp. rate 17, weight 76.4 kg (168 lb 6.9 oz), SpO2 93 %. No results found. No results for input(s): WBC, HGB, HCT, PLT in the last 72 hours.  Recent Labs  08/31/15 0610  NA 138  K 4.7  CL 106  GLUCOSE 119*  BUN 20  CREATININE 1.37*  CALCIUM 8.9   CBG (last 3)   Recent Labs  08/30/15 1644 08/30/15 2319 08/31/15 0658  GLUCAP 199* 103* 107*    Wt Readings from Last 3 Encounters:  08/31/15 76.4 kg (168 lb 6.9 oz)  08/17/15 75.3 kg (166 lb 0.1 oz)  08/03/15 80.1 kg (176 lb 9.4 oz)    Physical Exam:  Constitutional: He appears well-developed and well-nourished. No acute distress. Vital signs reviewed HENT: Normocephalic. Atraumatic Eyes: Conjunctivae and EOM are normal. No drainage or irritation.  Neck: Normal range of motion. Neck supple.  Cardiovascular: Normal rate. An irregularly irregular rhythm present. No murmur Respiratory: Effort normal and breath sounds normal. No respiratory distress. He has no wheezes. He exhibits no tenderness.  GI: He exhibits no distension. There is no tenderness.  Musculoskeletal: He exhibits no edema or tenderness.  Neurological: He is alert and oriented 3. Appropriate with conversation. Reasonable insight B/l UE 5/5 prox to distal.  B/l LE: 4 -/5 hip flexion, knee extension 4 /5 ankle dorsi/plantarflexion Skin: Skin is warm and dry. He is not diaphoretic.  Dry  skin.  Back incision healed   Psychiatric: He has a normal mood and affect. His behavior is normal.   Assessment/Plan: 1. 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 Physiatrist is providing close team supervision and 24 hour management of active medical problems listed below. Physiatrist and rehab team continue to assess barriers to discharge/monitor patient progress toward functional and medical goals.  Function:  Bathing Bathing position Bathing activity did not occur: Refused Position: Production manager parts bathed by patient: Right arm, Left arm, Chest, Abdomen, Front perineal area, Buttocks, Right upper leg, Left upper leg, Right lower leg, Left lower leg, Back Body parts bathed by helper: Back  Bathing assist Assist Level: Supervision or verbal cues      Upper Body Dressing/Undressing Upper body dressing   What is the patient wearing?: Pull over shirt/dress     Pull over shirt/dress - Perfomed by patient: Thread/unthread right sleeve, Thread/unthread left sleeve, Put head through opening, Pull shirt over trunk          Upper body assist Assist Level: No help, No cues   Set up : To obtain clothing/put away  Lower Body Dressing/Undressing Lower body dressing   What is the patient wearing?: Underwear, Pants, Shoes, [employer]  - Performed by patient: Thread/unthread right underwear leg, Pull underwear up/down, Thread/unthread left underwear leg Underwear - Performed by helper: Thread/unthread right underwear leg, Thread/unthread left underwear leg Pants- Performed by patient: Thread/unthread left pants leg, Pull pants up/down, Thread/unthread right pants leg, Fasten/unfasten pants Pants- Performed by helper: Thread/unthread right pants leg Non-skid slipper socks- Performed by patient: Don/doff right sock, Don/doff left sock Non-skid slipper socks- Performed by helper: Don/doff right sock, Don/doff left sock Socks - Performed by  patient: Don/doff right sock, Don/doff left sock   Shoes - Performed by patient: Don/doff right shoe, Don/doff left shoe Shoes - Performed by helper: Don/doff right shoe       TED  Hose - Performed by helper: Don/doff right TED hose, Don/doff left TED hose  Lower body assist Assist for lower body dressing: Supervision or verbal cues      Toileting Toileting   Toileting steps completed by patient: Adjust clothing prior to toileting, Performs perineal hygiene, Adjust clothing after toileting Toileting steps completed by helper: Adjust clothing prior to toileting, Performs perineal hygiene, Adjust clothing after toileting Toileting Assistive Devices: Grab bar or rail  Toileting assist Assist level: No help/no cues   Transfers Chair/bed transfer   Chair/bed transfer method: Ambulatory, Stand pivot Chair/bed transfer assist level: Supervision or verbal cues Chair/bed transfer assistive device: Armrests, Medical sales representative     Max distance: 150 Assist level: Supervision or verbal cues   Wheelchair   Type: Manual Max wheelchair distance: 200 Assist Level: Supervision or verbal cues  Cognition Comprehension Comprehension assist level: Understands basic 90% of the time/cues < 10% of the time  Expression Expression assist level: Expresses complex 90% of the time/cues < 10% of the time  Social Interaction Social Interaction assist level: Interacts appropriately 90% of the time - Needs monitoring or encouragement for participation or interaction.  Problem Solving Problem solving assist level: Solves basic 75 - 89% of the time/requires cueing 10 - 24% of the time  Memory Memory assist level: Recognizes or recalls 75 - 89% of the time/requires cueing 10 - 24% of the time   Medical Problem List and Plan: 1. 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 DVT Prophylaxis/Anticoagulation: Pharmaceutical: Coumadin per pharmacy protocol.  .    3. Chronic back pain/Pain Management: tylenol only at present 4. Mood: LCSW to  follow for evaluation and support.  5. Neuropsych: This patient is not yet capable of making decisions on his own behalf. 6. Skin/Wound Care: Dry dressing daily. Monitor wound for healing. Added protein supplement to help with low protein stores 7. Fluids/Electrolytes/Nutrition: Continue IVF at night to maintain adequate hydration status.  8. Acute renal failure: improved. Encourage po. On regular diet  -Cr 11.37 today---will need outpt follow up   -off IVF  -recheck labs tomorrow 9. A fib with RVR: HR in 80's.  On amiodarone and coumadin  10. PMR/RA: Has completed stress dose steroids--now on maintainance dose.  11. Anemia of chronic illness/iron deficiency: On iron supplement. hgb 11.2 12. GERD: protonix 13. DM2: Currently on novolog 3u bid with breakfast and lunch.   -used glucotrol 5 mg daily at home--resumed with good control in general  -off novologv 14. CAD: Off ASA/plavix-- now on coumadin and lipitor.  15. Dysphagia: regular and thins          16. Cough: no signs this morning  -encourage IS, OOB.   LOS (Days) 14 A FACE TO FACE EVALUATION WAS PERFORMED  SWARTZ,ZACHARY T 08/31/2015 8:26 AM

## 2015-08-31 NOTE — Progress Notes (Signed)
Physical Therapy Session Note  Patient Details  Name: Curtis Frazier MRN: [id number] Date of Birth: 1941-01-26  Today's Date: 08/31/2015 PT Individual Time: 0900-1000 and 1500-1545 PT Individual Time Calculation (min): 60 min and 45 min  Short Term Goals: Week 2:  PT Short Term Goal 1 (Week 2): = LTGs due to anticipated LOS  Skilled Therapeutic Interventions/Progress Updates:   Treatment 1: Patient's daughter present for session focused on standing balance, endurance, ambulation, and safety. Patient propelled wheelchair x 160 ft with supervision and no rest. Outcome measures reassessed: 10 MWT using RW = 0.67 m/s FTSS with BUE support = 17 sec (improved from 82 sec with min A on 08/20/15) Patient demonstrates high fall risk as noted by score of 25/56 on Berg Balance Scale, improved from 15/56 on 08/22/15.   Engaged in discharge planning and family education with daughter appropriately asking about removing rugs, etc. Patient reported he did not want family to remove rugs from hardwood floors, discussed patient's falls risk and reinforced recommendation to remove throw rugs and keep night light on at night as well as position BSC next to bed at night for safety. Patient's daughter verbalized understanding although patient continues to be resistant to simple home modifications. Per daughter, family did not install rail for garage entry at patient's request and patient plans to use RW to negotiate step. Discussed DME needs (18 x 18 wheelchair and basic cushion) and HHPT f/u. Patient ambulated back to room using RW x 160 ft with close supervision and verbal cues for upright posture, keeping body closer to RW, and relaxing shoulders. Discussed recommendation for supervision for ambulation and stairs, daughter reports that family plans to share coverage. Patient required seated rest breaks between standing/ambulation tasks due to shortness of breath and fatigue. Patient did demonstrate antalgic gait pattern  due to R hip pain and was unable to WB fully through RLE throughout session. Patient's daughter reported premorbid hip pain and history of RA. Patient left sitting in wheelchair with daughter present and needs in reach.   Treatment 2: Patient in wheelchair, reporting need for bathroom. Patient ambulated to and from bathroom using RW and performed toileting tasks with supervision. Gait training from room to ortho gym with 2 seated rest breaks using RW with close supervision and cues for keeping RW closer to body for safety and upright posture with relaxed shoulders. Patient negotiated up/down 6.5" curb using RW x 2 with min guard > supervision and up/down 8" curb using RW x 2 with min guard > supervision to simulate home entry with verbal cues for safety and technique, particularly getting close to step before lifting/lowering RW. Simulated car transfer to sedan height using RW with supervision. Stair training up/down 8 (6") stairs using 2 rails with self-elected step-to pattern, cues to lead with stronger LLE ascending and painful RLE descending with supervision. Patient ambulated back to room x 200 ft using RW with patient's son Abagail Kitchens providing supervision. With increased ambulation, patient demonstrated increased antalgic gait pattern and decreased RLE weightbearing due to R hip pain, MD aware. Patient left sitting in wheelchair with needs within reach and son present.   Therapy Documentation Precautions:  Precautions Precautions: Fall, Back Precaution Comments: Recent spine surgery 2/2 Restrictions Weight Bearing Restrictions: No Pain: Pain Assessment Pain Assessment: No/denies pain Locomotion : Gait Gait velocity: 10 MWT = 0.67 m/s   Balance: Five times Sit to Stand Test (FTSS) Method: Use a straight back chair with a solid seat that is 16-18" high. Ask participant  to sit on the chair with arms folded across their chest.   Instructions: "Stand up and sit down as quickly as possible 5 times,  keeping your arms folded across your chest."   Measurement: Stop timing when the participant stands the 5th time.  TIME: __17 with BUE support____ (in seconds)  Times > 13.6 seconds is associated with increased disability and morbidity (Guralnik, 2000) Times > 15 seconds is predictive of recurrent falls in healthy individuals aged 49 and older (Buatois, et al., 2008) Normal performance values in community dwelling individuals aged 104 and older (Bohannon, 2006): o 60-69 years: 11.4 seconds o 70-79 years: 12.6 seconds o 80-89 years: 14.8 seconds  MCID: ? 2.3 seconds for Vestibular Disorders Mariah Milling, 2006) Standardized Balance Assessment Standardized Balance Assessment: Berg Balance Test Berg Balance Test Sit to Stand: Able to stand  independently using hands Standing Unsupported: Able to stand 30 seconds unsupported Sitting with Back Unsupported but Feet Supported on Floor or Stool: Able to sit safely and securely 2 minutes Stand to Sit: Controls descent by using hands Transfers: Able to transfer with verbal cueing and /or supervision Standing Unsupported with Eyes Closed: Able to stand 10 seconds with supervision Standing Ubsupported with Feet Together: Able to place feet together independently and stand for 1 minute with supervision From Standing, Reach Forward with Outstretched Arm: Can reach forward >12 cm safely (5") From Standing Position, Pick up Object from Floor: Unable to try/needs assist to keep balance (back precautions) From Standing Position, Turn to Look Behind Over each Shoulder: Turn sideways only but maintains balance (limited by back precautions) Turn 360 Degrees: Needs assistance while turning Standing Unsupported, Alternately Place Feet on Step/Stool: Needs assistance to keep from falling or unable to try Standing Unsupported, One Foot in Front: Loses balance while stepping or standing Standing on One Leg: Unable to try or needs assist to prevent fall Total Score:  25/56  See Function Navigator for Current Functional Status.   Therapy/Group: Individual Therapy  [name] 08/31/2015, 9:59 AM

## 2015-09-01 ENCOUNTER — Inpatient Hospital Stay (HOSPITAL_COMMUNITY): Payer: Medicare Other | Admitting: Speech Pathology

## 2015-09-01 ENCOUNTER — Inpatient Hospital Stay (HOSPITAL_COMMUNITY): Payer: Medicare Other

## 2015-09-01 ENCOUNTER — Inpatient Hospital Stay (HOSPITAL_COMMUNITY): Payer: Medicare Other | Admitting: Physical Therapy

## 2015-09-01 DIAGNOSIS — M25551 Pain in right hip: Secondary | ICD-10-CM

## 2015-09-01 LAB — GLUCOSE, CAPILLARY
GLUCOSE-CAPILLARY: 91 mg/dL (ref 65–99)
GLUCOSE-CAPILLARY: 226 mg/dL — AB (ref 65–99)
GLUCOSE-CAPILLARY: 98 mg/dL (ref 65–99)
Glucose-Capillary: 100 mg/dL — ABNORMAL HIGH (ref 65–99)

## 2015-09-01 LAB — BASIC METABOLIC PANEL
ANION GAP: 8 (ref 5–15)
BUN: 20 mg/dL (ref 6–20)
CALCIUM: 8.9 mg/dL (ref 8.9–10.3)
CO2: 24 mmol/L (ref 22–32)
Chloride: 108 mmol/L (ref 101–111)
Creatinine, Ser: 1.38 mg/dL — ABNORMAL HIGH (ref 0.61–1.24)
GFR, EST AFRICAN AMERICAN: 56 mL/min — AB (ref >60)
GFR, EST NON AFRICAN AMERICAN: 48 mL/min — AB (ref >60)
Glucose, Bld: 123 mg/dL — ABNORMAL HIGH (ref 65–99)
POTASSIUM: 5 mmol/L (ref 3.5–5.1)
Sodium: 140 mmol/L (ref 135–145)

## 2015-09-01 LAB — PROTIME-INR
INR: 2.11 — ABNORMAL HIGH (ref 0.00–1.49)
PROTHROMBIN TIME: 23.5 s — AB (ref 11.6–15.2)

## 2015-09-01 NOTE — Progress Notes (Signed)
Speech Language Pathology Daily Session Note  Patient Details  Name: Curtis Frazier MRN: [id number] Date of Birth: Mar 09, 1941  Today's Date: 09/01/2015 SLP Individual Time: 1430-1505 SLP Individual Time Calculation (min): 35 min  Short Term Goals: Week 2: SLP Short Term Goal 1 (Week 2): Patient will consume current diet with minimal overt s/s of aspiration with Min A verbal cues for use of swallowing compensatory strategies.  SLP Short Term Goal 2 (Week 2): Patient will perform pharyngeal strengthening exercises with Mod I for accuracy.  SLP Short Term Goal 3 (Week 2): Patient will utilize external memory aids to recall new, daily information with supervision verbal and questioning cues.  SLP Short Term Goal 4 (Week 2): Patient will demonstrate selective attention to a functional task in a mildly distracting enviornment for 60 minutes with supervision  verbal cues for redirection.   Skilled Therapeutic Interventions: Pt was seen for skilled ST targeting cognitive goals and completion of education prior to discharge.  Pt required min question cues to recall recommended swallowing precautions and identify 1 goal that he has been working towards in [name] while inpatient.  SLP facilitated the session with skilled education regarding compensatory memory strategies, including use of written aids, routines, and visual reminders to facilitate improved recall of daily information. Pt was able to identify 3 memory strategies which he used prior to admission to assist in recall of information with supervision question cues. A handout of memory compensatory strategies was provided to facilitate carryover in the home environment.  Pt was left in wheelchair with call bell within reach.  Continue per current plan of care.    Function:  Eating Eating                 Cognition Comprehension Comprehension assist level: Understands basic 90% of the time/cues < 10% of the time  Expression   Expression assist  level: Expresses complex 90% of the time/cues < 10% of the time  Social Interaction Social Interaction assist level: Interacts appropriately 90% of the time - Needs monitoring or encouragement for participation or interaction.  Problem Solving Problem solving assist level: Solves basic 75 - 89% of the time/requires cueing 10 - 24% of the time  Memory Memory assist level: Recognizes or recalls 75 - 89% of the time/requires cueing 10 - 24% of the time    Pain Pain Assessment Pain Assessment: No/denies pain  Therapy/Group: Individual Therapy  Curtis Frazier, Selinda Orion 09/01/2015, 3:46 PM

## 2015-09-01 NOTE — Patient Care Conference (Signed)
Inpatient RehabilitationTeam Conference and Plan of Care Update Date: 08/31/2015   Time: 2:30 PM    Patient Name: Curtis Frazier      Medical Record Number: [id number]  Date of Birth: [birth year] Sex: Male         Room/Bed: 4W09C/4W09C-01 Payor Info: Payor: MEDICARE / Plan: MEDICARE PART A AND B / Product Type: *No Product type* /    Admitting Diagnosis: ENCEPH DECONDITION  Admit Date/Time:  08/17/2015  5:36 PM Admission Comments: No comment available   Primary Diagnosis:  <principal problem not specified> Principal Problem: <principal problem not specified>  Patient Active Problem List   Diagnosis Date Noted  . Subtherapeutic international normalized ratio (INR)   . Supratherapeutic INR   . Labile blood glucose   . Cough   . AKI (acute kidney injury) ([place])   . Encephalopathy, metabolic [id number]  . Debility   . 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 Dysphagia   . Spondylosis of lumbar region without myelopathy or radiculopathy   . Thrush 08/12/2015  . Urinary retention 08/12/2015  . Hypotension 08/11/2015  . Hypernatremia 08/10/2015  . Encephalopathy 08/10/2015  . Acute on chronic renal failure ([place]) 08/10/2015  . Chronic diastolic congestive heart failure ([place]) 08/10/2015  . Persistent atrial fibrillation ([place])   . Acute diastolic congestive heart failure ([place])   . Dyspnea   . Disorientation   . Slow transit constipation   . Back pain without radiation   . Counseling regarding advanced care planning and goals of care   . PAF (paroxysmal atrial fibrillation) ([place]) 07/29/2015  . Advanced care planning/counseling discussion   . Pneumonia   . Change in mental state   . Endotracheally intubated   . CHF (congestive heart failure) ([place])   . Palliative care encounter 07/21/2015  . DNR (do not resuscitate) discussion 07/21/2015  . HCAP (healthcare-associated pneumonia)   . Acute respiratory failure  ([place])   . Acute respiratory failure with hypoxia ([place]) 07/18/2015  . Atrial fibrillation with RVR ([place]) 07/18/2015  . Acute pulmonary edema ([place]) 07/18/2015  . Sepsis ([place]) 07/17/2015  . Acute encephalopathy 07/17/2015  . LLL pneumonia 07/17/2015  . Type II or unspecified type diabetes mellitus without mention of complication, not stated as uncontrolled 07/17/2015  . S/P lumbar laminectomy 07/17/2015  . Elevated troponin 07/17/2015  . Type 2 diabetes mellitus without complication, without long-term current use of insulin ([place])   . Acute bronchitis 03/05/2015  . Back pain, thoracic 01/15/2015  . Chest wall pain 01/15/2015  . Routine general medical examination at a health care facility 11/23/2014  . Productive cough 10/14/2014  . Left flank pain 09/25/2014  . Renal insufficiency 11/14/2013  . Encounter for Medicare annual wellness exam 07/07/2013  . BPH (benign prostatic hyperplasia) 07/05/2012  . Prostate cancer screening 06/27/2012  . Low TSH level 01/22/2012  . PMR (polymyalgia rheumatica) ([facility]) 01/20/2012  . Aortic stenosis 07/08/2011  . New onset a-fib ([place]) 07/05/2011    Class: Acute  . HYPERKALEMIA 02/18/2010  . Essential hypertension, benign 09/13/2009  . ANEMIA-UNSPECIFIED 06/10/2009  . ANGIODYSPLASIA-INTESTINE 06/10/2009  . MURMUR 05/21/2009  . Left carotid bruit 05/21/2009  . Coronary atherosclerosis 03/09/2009  . GERD 03/09/2009  . Rheumatoid arthritis ([place]) 04/29/2008  . HX, PERSONAL, COLONIC POLYPS 01/17/2007  . Diabetes mellitus type 2, controlled, without complications ([place]) [id number]  . Elevated lipids 09/18/2006  . PSORIATIC ARTHRITIS 09/18/2006  . PSORIASIS 09/18/2006  .  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 DEGENERATIVE DISC DISEASE 09/18/2006  . SCOLIOSIS 09/18/2006  . TOBACCO ABUSE, HX OF 09/18/2006    Expected Discharge Date: Expected Discharge Date: 09/02/15  Team Members Present: Physician leading conference: Dr. Alger Simons Social Worker  Present: Lennart Pall, LCSW Nurse Present: Dorien Chihuahua, RN PT Present: Carney Living, PT OT Present: Willeen Cass, OT;Roanna Epley, COTA SLP Present: Weston Anna, SLP PPS Coordinator present : Daiva Nakayama, RN, CRRN     Current Status/Progress Goal Weekly Team Focus  Medical   improved pain control and cognition, stable for dc  see prior  pain/nutrition   Bowel/Bladder   continent bowel and bladder last bm 3/19  to remain continent      Swallow/Nutrition/ Hydration   Regular textures with thin liquids with use of swallow strategies with supervision cues  Min A with least restrictive diet  tolerance of current diet, family education    ADL's   supervision overall; decreased activity tolerance  mod I overall  activity tolerance, standing balance, safety awareness, family education   Mobility   supervision  mod I-supervision  functional mobility training, standing balance, activity tolerance, strengthening, safety, pt/family education   Communication             Safety/Cognition/ Behavioral Observations  Min A   Min A   attention, recall, functional problem solving and family education    Pain   denies pain this shift  pain <2      Skin   dehisced area to lower back. dry dressing continued.  no new skin breakdown       Rehab Goals Patient on target to meet rehab goals: Yes *See Care Plan and progress notes for long and short-term goals.  Barriers to Discharge: safety awareness    Possible Resolutions to Barriers:  see prior, prepare family/pt for discharge, education    Discharge Planning/Teaching Needs:  Home with  family arranging 24/7 assist/ supervision.  family ed completed   Team Discussion:  Pt c/o of tremors and increased hip pain - MD aware.  Team reports meeting goals and ready for d/c on Thursday.   Revisions to Treatment Plan:  None   Continued Need for Acute Rehabilitation Level of Care: The patient requires daily medical management by a physician with  specialized training in physical medicine and rehabilitation for the following conditions: Daily direction of a multidisciplinary physical rehabilitation program to ensure safe treatment while eliciting the highest outcome that is of practical value to the patient.: Yes Daily medical management of patient stability for increased activity during participation in an intensive rehabilitation regime.: Yes Daily analysis of laboratory values and/or radiology reports with any subsequent need for medication adjustment of medical intervention for : Neurological problems;Post surgical problems;Wound care problems  Bingham Millette 09/01/2015, 3:37 PM

## 2015-09-01 NOTE — Progress Notes (Signed)
Occupational Therapy Discharge Summary  Patient Details  Name: Curtis Frazier MRN: [id number] Date of Birth: Sep 02, 1940   Patient has met 9 of 9 long term goals due to improved activity tolerance, improved balance, ability to compensate for deficits, improved attention and improved awareness. Pt made steady progress with BADLs during this admission and is mod I for bathing, dressing, and toileting.  Pt requries supervision for shower/tub transfers.  Pt fatigues quickly and requires multiple rest breaks during tasks.  Pt has been educated on energy conservation strategies and has verbalized understanding.  Pt's family (daughters and son) has been present for therapy sessions Patient to discharge at overall Modified Independent level for basic self care and S tub transfers.  Patient's care partner is independent to provide the necessary physical and cognitive assistance at discharge.      Recommendation:  Patient will benefit from ongoing skilled OT services in home health setting to continue to advance functional skills in the area of BADL and iADL.  Equipment: Family to purchase tub seat; pt owns a [facility]  Reasons for discharge: treatment goals met  Patient/family agrees with progress made and goals achieved: Yes  OT Discharge Precautions/Restrictions  Precautions Precautions: Back;Fall Restrictions Weight Bearing Restrictions: No ADL ADL ADL Comments: Refer to functional navigator  Vision/Perception  Vision- History Baseline Vision/History: Wears glasses Wears Glasses: At all times Patient Visual Report: No change from baseline Vision- Assessment Vision Assessment?: No apparent visual deficits  Cognition Overall Cognitive Status: Impaired/Different from baseline Arousal/Alertness: Awake/alert Orientation Level: Oriented X4 Attention: Alternating Selective Attention: Appears intact Alternating Attention: Appears intact Memory: Impaired Memory Impairment: Decreased recall of new  information;Decreased short term memory Decreased Short Term Memory: Verbal basic Awareness: Appears intact Safety/Judgment: Appears intact Sensation Sensation Light Touch: Appears Intact Stereognosis: Appears Intact Hot/Cold: Appears Intact Proprioception: Appears Intact Coordination Gross Motor Movements are Fluid and Coordinated: Yes Fine Motor Movements are Fluid and Coordinated: No (intention tremors noted) Motor  Motor Motor: Within Functional Limits Motor - Discharge Observations: generalized weakness and deconditioning Mobility  Bed Mobility Bed Mobility: Supine to Sit;Rolling Right;Rolling Left;Sit to Supine Rolling Right: 6: Modified independent (Device/Increase time) Rolling Left: 6: Modified independent (Device/Increase time) Supine to Sit: 6: Modified independent (Device/Increase time) Sit to Supine: 6: Modified independent (Device/Increase time) Transfers Sit to Stand: 6: Modified independent (Device/Increase time);With upper extremity assist;With armrests Stand to Sit: 6: Modified independent (Device/Increase time);With upper extremity assist;With armrests  Trunk/Postural Assessment  Cervical Assessment Cervical Assessment: Within Functional Limits Thoracic Assessment Thoracic Assessment: Exceptions to [facility] (kyphotic) Lumbar Assessment Lumbar Assessment: Exceptions to [facility] (back precautions; posterior pelvic tilt) Postural Control Postural Control: Within Functional Limits  Balance Standardized Balance Assessment Standardized Balance Assessment: Berg Balance Test Berg Balance Test Sit to Stand: Able to stand  independently using hands Standing Unsupported: Able to stand 30 seconds unsupported Sitting with Back Unsupported but Feet Supported on Floor or Stool: Able to sit safely and securely 2 minutes Stand to Sit: Controls descent by using hands Transfers: Able to transfer with verbal cueing and /or supervision Standing Unsupported with Eyes Closed: Able to  stand 10 seconds with supervision Standing Ubsupported with Feet Together: Able to place feet together independently and stand for 1 minute with supervision From Standing, Reach Forward with Outstretched Arm: Can reach forward >12 cm safely (5") From Standing Position, Pick up Object from Floor: Unable to try/needs assist to keep balance (back precautions) From Standing Position, Turn to Look Behind Over each Shoulder: Turn sideways only but maintains  balance (limited by back precautions) Turn 360 Degrees: Needs assistance while turning Standing Unsupported, Alternately Place Feet on Step/Stool: Needs assistance to keep from falling or unable to try Standing Unsupported, One Foot in Front: Loses balance while stepping or standing Standing on One Leg: Unable to try or needs assist to prevent fall Total Score: 25 Dynamic Sitting Balance Dynamic Sitting - Level of Assistance: 7: Independent Static Standing Balance Static Standing - Level of Assistance: 6: Modified independent (Device/Increase time) Dynamic Standing Balance Dynamic Standing - Level of Assistance: 5: Stand by assistance Extremity/Trunk Assessment RUE Assessment RUE Assessment: Within Functional Limits LUE Assessment LUE Assessment: Within Functional Limits   See Function Navigator for Current Functional Status.  Leotis Shames [facility] 09/01/2015, 10:54 AM

## 2015-09-01 NOTE — Progress Notes (Signed)
Occupational Therapy Session Note  Patient Details  Name: Curtis Frazier MRN: [id number] Date of Birth: 12/17/1940  Today's Date: 09/01/2015 OT Individual Time: 0700-0758 OT Individual Time Calculation (min): 58 min    Short Term Goals: Week 2:  OT Short Term Goal 1 (Week 2): STG=LTG secondary to ELOS  Skilled Therapeutic Interventions/Progress Updates:    Pt sitting EOB upon arrival.  Pt had just finished breakfast and stated he was ready to take a shower.  Pt amb with RW in room to gather clothing prior to entering bathroom to use toilet prior to taking a shower.  Pt completed all tasks without assistance and no safety concerns noted.  Pt completed grooming tasks while sitting at sink.  Pt stated he would be able to sit at home for grooming tasks.  Pt used AE appropriately to complete LB bathing and dressing tasks.  Continued discharge planning.  Pt stated he was "ready" to go home and had no questions.  Pt continues to fatigue quickly and requires multiple rest breaks throughout the session.  Focus on activity tolerance, energy conservation strategies, standing balance, functional amb with RW, and safety awareness to increase independence with BADLs.  Therapy Documentation Precautions:  Precautions Precautions: Fall, Back Precaution Comments: Recent spine surgery 2/2 Restrictions Weight Bearing Restrictions: No General:   Vital Signs: Therapy Vitals Temp: 98.3 F (36.8 C) Temp Source: Oral Pulse Rate: 86 Resp: 18 BP: (!) 109/58 mmHg Patient Position (if appropriate): Lying Oxygen Therapy SpO2: 94 % O2 Device: Not Delivered Pain:  Pt c/o 5/10 pain in right hip with standing and ambulation; repositioned and rest ADL: ADL ADL Comments: Refer to functional navigator   See Function Navigator for Current Functional Status.   Therapy/Group: Individual Therapy  Leroy Libman 09/01/2015, 8:01 AM

## 2015-09-01 NOTE — Progress Notes (Signed)
Speech Language Pathology Daily Session Note  Patient Details  Name: Curtis Frazier MRN: [id number] Date of Birth: Sep 13, 1940  Today's Date: 09/01/2015 SLP Concurrent Time: 1105-1200 SLP Concurrent Time Calculation (min): 55 min   Short Term Goals: Week 2: SLP Short Term Goal 1 (Week 2): Patient will consume current diet with minimal overt s/s of aspiration with Min A verbal cues for use of swallowing compensatory strategies.  SLP Short Term Goal 2 (Week 2): Patient will perform pharyngeal strengthening exercises with Mod I for accuracy.  SLP Short Term Goal 3 (Week 2): Patient will utilize external memory aids to recall new, daily information with supervision verbal and questioning cues.  SLP Short Term Goal 4 (Week 2): Patient will demonstrate selective attention to a functional task in a mildly distracting enviornment for 60 minutes with supervision  verbal cues for redirection.   Skilled Therapeutic Interventions: Pt seen upright in wheelchair. Learned a novel card game requiring working memory for 3 component directions. Pt required min A to faciliatate recall of 3 components. Performance declined with increased speed and increased environmental distraction. Pt able to verbalize swallow precautions and demonstrate use of precautions with 100% acc despite environmental distractions. No s/s aspiration.    Function:  Eating Eating   Modified Consistency Diet: No Eating Assist Level: Swallowing techniques: self managed           Cognition Comprehension Comprehension assist level: Follows basic conversation/direction with extra time/assistive device  Expression   Expression assist level: Expresses complex 90% of the time/cues < 10% of the time  Social Interaction Social Interaction assist level: Interacts appropriately 90% of the time - Needs monitoring or encouragement for participation or interaction.  Problem Solving Problem solving assist level: Solves basic 75 - 89% of the  time/requires cueing 10 - 24% of the time  Memory Memory assist level: Recognizes or recalls 75 - 89% of the time/requires cueing 10 - 24% of the time    Pain Pain Assessment Pain Assessment: No/denies pain  Therapy/Group: Individual Therapy  Vinetta Bergamo 09/01/2015, 3:56 PM

## 2015-09-01 NOTE — Progress Notes (Signed)
[place] PHYSICAL MEDICINE & REHABILITATION     PROGRESS NOTE  Subjective/Complaints: Discussed his hip pain with me today. Having pain on outer portion of hip as well as in the groin. Worse when he moves/stands.  ROS: Denies CP, SOB, N/V/D.  Objective: Vital Signs: Blood pressure 109/58, pulse 86, temperature 98.3 F (36.8 C), temperature source Oral, resp. rate 18, weight 75.569 kg (166 lb 9.6 oz), SpO2 94 %. No results found. No results for input(s): WBC, HGB, HCT, PLT in the last 72 hours.  Recent Labs  08/31/15 0610 09/01/15 0637  NA 138 140  K 4.7 5.0  CL 106 108  GLUCOSE 119* 123*  BUN 20 20  CREATININE 1.37* 1.38*  CALCIUM 8.9 8.9   CBG (last 3)   Recent Labs  08/31/15 1626 08/31/15 2057 09/01/15 0604  GLUCAP 192* 141* 100*    Wt Readings from Last 3 Encounters:  09/01/15 75.569 kg (166 lb 9.6 oz)  08/17/15 75.3 kg (166 lb 0.1 oz)  08/03/15 80.1 kg (176 lb 9.4 oz)    Physical Exam:  Constitutional: He appears well-developed and well-nourished. No acute distress. Vital signs reviewed HENT: Normocephalic. Atraumatic Eyes: Conjunctivae and EOM are normal. No drainage or irritation.  Neck: Normal range of motion. Neck supple.  Cardiovascular: Normal rate. An irregularly irregular rhythm present. No murmur Respiratory: Effort normal and breath sounds normal. No respiratory distress. He has no wheezes. He exhibits no tenderness.  GI: He exhibits no distension. There is no tenderness.  Musculoskeletal: He exhibits no edema or tenderness.  Neurological: He is alert and oriented 3. Appropriate with conversation. Reasonable insight B/l UE 5/5 prox to distal.  B/l LE: 4 -/5 hip flexion, knee extension 4 /5 ankle dorsi/plantarflexion Skin: Skin is warm and dry. He is not diaphoretic.  Dry  skin.  Back incision healed   Psychiatric: He has a normal mood and affect. His behavior is normal.   Assessment/Plan: 1. 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 Physiatrist is providing close team supervision and 24 hour management of active medical problems listed below. Physiatrist and rehab team continue to assess barriers to discharge/monitor patient progress toward functional and medical goals.  Function:  Bathing Bathing position Bathing activity did not occur: Refused Position: Production manager parts bathed by patient: Right arm, Left arm, Chest, Abdomen, Front perineal area, Buttocks, Right upper leg, Left upper leg, Right lower leg, Left lower leg, Back Body parts bathed by helper: Back  Bathing assist Assist Level: Assistive device      Upper Body Dressing/Undressing Upper body dressing   What is the patient wearing?: Pull over shirt/dress     Pull over shirt/dress - Perfomed by patient: Thread/unthread right sleeve, Thread/unthread left sleeve, Put head through opening, Pull shirt over trunk          Upper body assist Assist Level: No help, No cues   Set up : To obtain clothing/put away  Lower Body Dressing/Undressing Lower body dressing   What is the patient wearing?: Underwear, Pants, Socks, Shoes Underwear - Performed by patient: Thread/unthread right underwear leg, Pull underwear up/down, Thread/unthread left underwear leg Underwear - Performed by helper: Thread/unthread right underwear leg, Thread/unthread left underwear leg Pants- Performed by patient: Thread/unthread left pants leg, Pull pants up/down, Thread/unthread right pants leg, Fasten/unfasten pants Pants- Performed by helper: Thread/unthread right pants leg Non-skid slipper socks- Performed  by patient: Don/doff right sock, Don/doff left sock Non-skid slipper socks- Performed by helper: Don/doff right sock, Don/doff left sock Socks - Performed by patient: Don/doff right sock, Don/doff left sock   Shoes - Performed  by patient: Don/doff right shoe, Don/doff left shoe Shoes - Performed by helper: Don/doff right shoe       TED Hose - Performed by helper: Don/doff right TED hose, Don/doff left TED hose  Lower body assist Assist for lower body dressing: Assistive device, More than reasonable time      Toileting Toileting   Toileting steps completed by patient: Adjust clothing prior to toileting, Performs perineal hygiene, Adjust clothing after toileting Toileting steps completed by helper: Adjust clothing prior to toileting, Performs perineal hygiene, Adjust clothing after toileting Toileting Assistive Devices: Grab bar or rail  Toileting assist Assist level: More than reasonable time, No help/no cues   Transfers Chair/bed transfer   Chair/bed transfer method: Ambulatory, Stand pivot Chair/bed transfer assist level: Supervision or verbal cues Chair/bed transfer assistive device: Armrests, Medical sales representative     Max distance: 150 Assist level: Supervision or verbal cues   Wheelchair   Type: Manual Max wheelchair distance: 150 Assist Level: Supervision or verbal cues  Cognition Comprehension Comprehension assist level: Understands basic 90% of the time/cues < 10% of the time  Expression Expression assist level: Expresses complex 90% of the time/cues < 10% of the time  Social Interaction Social Interaction assist level: Interacts appropriately 90% of the time - Needs monitoring or encouragement for participation or interaction.  Problem Solving Problem solving assist level: Solves basic 75 - 89% of the time/requires cueing 10 - 24% of the time  Memory Memory assist level: Recognizes or recalls 75 - 89% of the time/requires cueing 10 - 24% of the time   Medical Problem List and Plan: 1. 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 DVT  Prophylaxis/Anticoagulation: Pharmaceutical: Coumadin per pharmacy protocol.  .    3. Chronic back pain/Pain Management: tylenol only at present  -having increased right hip pain. Is off mtx   -check hip xr today 4. Mood: LCSW to follow for evaluation and support.  5. Neuropsych: This patient is not yet capable of making decisions on his own behalf. 6. Skin/Wound Care: Dry dressing daily. Monitor wound for healing. Added protein supplement to help with low protein stores 7. Fluids/Electrolytes/Nutrition: Continue IVF at night to maintain adequate hydration status.  8. Acute renal failure: improved. Encourage po. On regular diet  -Cr 1.38 today---will need outpt follow up   -off IVF 9. A fib with RVR: HR in 80's.  On amiodarone and coumadin  10. PMR/RA: Has completed stress dose steroids--now on maintainance dose.  -will check with NS regarding resumption of MTX  11. Anemia of chronic illness/iron deficiency: On iron supplement. hgb 11.2 12. GERD: protonix 13. DM2: Currently on novolog 3u bid with breakfast and lunch.   -used glucotrol 5 mg daily at home--resumed with reasonable control  -off novologv 14. CAD: Off ASA/plavix-- now on coumadin and lipitor.  15. Dysphagia: regular and thins          16. Cough: no signs this morning  -encourage IS, OOB.   LOS (Days) 15 A FACE TO FACE EVALUATION WAS PERFORMED  SWARTZ,ZACHARY T 09/01/2015 8:49 AM

## 2015-09-01 NOTE — Progress Notes (Signed)
Social Work Patient ID: Curtis Frazier, male   DOB: 07-13-1940, 75 y.o.   MRN: [id number]   Have reviewed team conference with pt and family.  All aware that team feels ready for d/c tomorrow and pt reports he is ready.  Family stressed by need to continue with 24/7 supervision at home as they have been here daily.  I have provided them with private duty agency information that they can arrange as needed.  They understand the safety concerns and reason for 24/7 care.  Have also reviewed HH and DME to be arranged.  Ready for d/c.  [name], LCSW

## 2015-09-01 NOTE — Progress Notes (Signed)
Social Work Patient ID: Curtis Frazier, male   DOB: Aug 19, 1940, 75 y.o.   MRN: [id number]  Curtis Curb, LCSW Social Worker Signed  Patient Care Conference 09/01/2015  2:26 PM    Expand All Collapse All   Inpatient RehabilitationTeam Conference and Plan of Care Update Date: 08/31/2015   Time: 2:30 PM     Patient Name: Curtis Frazier       Medical Record Number: [id number]  Date of Birth: Apr 17, 1941 Sex: Male         Room/Bed: 4W09C/4W09C-01 Payor Info: Payor: MEDICARE / Plan: MEDICARE PART A AND B / Product Type: *No Product type* /    Admitting Diagnosis: ENCEPH DECONDITION  Admit Date/Time:  08/17/2015  5:36 PM Admission Comments: No comment available   Primary Diagnosis:  <principal problem not specified> Principal Problem: <principal problem not specified>    Patient Active Problem List     Diagnosis  Date Noted   .  Subtherapeutic international normalized ratio (INR)     .  Supratherapeutic INR     .  Labile blood glucose     .  Cough     .  AKI (acute kidney injury) ([place])     .  Encephalopathy, metabolic  [id number]   .  Debility     .  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  Dysphagia     .  Spondylosis of lumbar region without myelopathy or radiculopathy     .  Thrush  08/12/2015   .  Urinary retention  08/12/2015   .  Hypotension  08/11/2015   .  Hypernatremia  08/10/2015   .  Encephalopathy  08/10/2015   .  Acute on chronic renal failure ([place])  08/10/2015   .  Chronic diastolic congestive heart failure ([place])  08/10/2015   .  Persistent atrial fibrillation ([place])     .  Acute diastolic congestive heart failure ([place])     .  Dyspnea     .  Disorientation     .  Slow transit constipation     .  Back pain without radiation     .  Counseling regarding advanced care planning and goals of care     .  PAF (paroxysmal atrial fibrillation) ([place])  07/29/2015   .  Advanced care planning/counseling  discussion     .  Pneumonia     .  Change in mental state     .  Endotracheally intubated     .  CHF (congestive heart failure) ([place])     .  Palliative care encounter  07/21/2015   .  DNR (do not resuscitate) discussion  07/21/2015   .  HCAP (healthcare-associated pneumonia)     .  Acute respiratory failure ([place])     .  Acute respiratory failure with hypoxia ([place])  07/18/2015   .  Atrial fibrillation with RVR ([place])  07/18/2015   .  Acute pulmonary edema ([place])  07/18/2015   .  Sepsis ([place])  07/17/2015   .  Acute encephalopathy  07/17/2015   .  LLL pneumonia  07/17/2015   .  Type II or unspecified type diabetes mellitus without mention of complication, not stated as uncontrolled  07/17/2015   .  S/P lumbar laminectomy  07/17/2015   .  Elevated troponin  07/17/2015   .  Type 2 diabetes mellitus without complication,  without long-term current use of insulin ([place])     .  Acute bronchitis  03/05/2015   .  Back pain, thoracic  01/15/2015   .  Chest wall pain  01/15/2015   .  Routine general medical examination at a health care facility  11/23/2014   .  Productive cough  10/14/2014   .  Left flank pain  09/25/2014   .  Renal insufficiency  11/14/2013   .  Encounter for Medicare annual wellness exam  07/07/2013   .  BPH (benign prostatic hyperplasia)  07/05/2012   .  Prostate cancer screening  06/27/2012   .  Low TSH level  01/22/2012   .  PMR (polymyalgia rheumatica) (HCC)  01/20/2012   .  Aortic stenosis  07/08/2011   .  New onset a-fib ([place])  07/05/2011       Class: Acute   .  HYPERKALEMIA  02/18/2010   .  Essential hypertension, benign  09/13/2009   .  ANEMIA-UNSPECIFIED  06/10/2009   .  ANGIODYSPLASIA-INTESTINE  06/10/2009   .  MURMUR  05/21/2009   .  Left carotid bruit  05/21/2009   .  Coronary atherosclerosis  03/09/2009   .  GERD  03/09/2009   .  Rheumatoid arthritis ([place])  04/29/2008   .  HX, PERSONAL, COLONIC POLYPS  01/17/2007   .  Diabetes mellitus type 2, controlled,  without complications ([place])  [id number]   .  Elevated lipids  09/18/2006   .  PSORIATIC ARTHRITIS  09/18/2006   .  PSORIASIS  09/18/2006   .  ACTINIC KERATOSIS  09/18/2006   .  DEGENERATIVE DISC DISEASE  09/18/2006   .  SCOLIOSIS  09/18/2006   .  TOBACCO ABUSE, HX OF  09/18/2006     Expected Discharge Date: Expected Discharge Date: 09/02/15  Team Members Present: Physician leading conference: Dr. Alger Simons Social Worker Present: Lennart Pall, LCSW Nurse Present: Dorien Chihuahua, RN PT Present: Carney Living, PT OT Present: Willeen Cass, OT;Roanna Epley, COTA SLP Present: Weston Anna, SLP PPS Coordinator present : Daiva Nakayama, RN, CRRN        Current Status/Progress  Goal  Weekly Team Focus   Medical     improved pain control and cognition, stable for dc  see prior  pain/nutrition   Bowel/Bladder     continent bowel and bladder last bm 3/19   to remain continent      Swallow/Nutrition/ Hydration     Regular textures with thin liquids with use of swallow strategies with supervision cues  Min A with least restrictive diet  tolerance of current diet, family education    ADL's     supervision overall; decreased activity tolerance   mod I overall  activity tolerance, standing balance, safety awareness, family education   Mobility     supervision  mod I-supervision  functional mobility training, standing balance, activity tolerance, strengthening, safety, pt/family education    Communication               Safety/Cognition/ Behavioral Observations    Min A   Min A   attention, recall, functional problem solving and family education    Pain     denies pain this shift  pain <2      Skin     dehisced area to lower back. dry dressing continued.  no new skin breakdown       Rehab Goals Patient on target to meet rehab goals: Yes *See Care Plan and  progress notes for long and short-term goals.    Barriers to Discharge:  safety awareness     Possible Resolutions to  Barriers:   see prior, prepare family/pt for discharge, education      Discharge Planning/Teaching Needs:   Home with  family arranging 24/7 assist/ supervision.  family ed completed    Team Discussion:    Pt c/o of tremors and increased hip pain - MD aware.  Team reports meeting goals and ready for d/c on Thursday.    Revisions to Treatment Plan:    None    Continued Need for Acute Rehabilitation Level of Care: The patient requires daily medical management by a physician with specialized training in physical medicine and rehabilitation for the following conditions: Daily direction of a multidisciplinary physical rehabilitation program to ensure safe treatment while eliciting the highest outcome that is of practical value to the patient.: Yes Daily medical management of patient stability for increased activity during participation in an intensive rehabilitation regime.: Yes Daily analysis of laboratory values and/or radiology reports with any subsequent need for medication adjustment of medical intervention for : Neurological problems;Post surgical problems;Wound care problems  Ahleah Simko 09/01/2015, 3:37 PM                 Curtis Curb, LCSW Social Worker Signed  Patient Care Conference 08/24/2015  1:18 PM    Expand All Collapse All   Inpatient RehabilitationTeam Conference and Plan of Care Update Date: 08/24/2015   Time: 10:35 AM     Patient Name: Curtis Frazier      Medical Record Number: [id number]  Date of Birth: January 13, 1941 Sex: Male         Room/Bed: 4W09C/4W09C-01 Payor Info: Payor: MEDICARE / Plan: MEDICARE PART A AND B / Product Type: *No Product type* /    Admitting Diagnosis: ENCEPH DECONDITION   Admit Date/Time:  08/17/2015  5:36 PM Admission Comments: No comment available   Primary Diagnosis:  <principal problem not specified> Principal Problem: <principal problem not specified>    Patient Active Problem List     Diagnosis  Date Noted   .  Subtherapeutic  international normalized ratio (INR)     .  Supratherapeutic INR     .  Labile blood glucose     .  Cough     .  AKI (acute kidney injury) ([place])     .  Encephalopathy, metabolic  [id number]   .  Debility     .  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  Dysphagia     .  Spondylosis of lumbar region without myelopathy or radiculopathy     .  Thrush  08/12/2015   .  Urinary retention  08/12/2015   .  Hypotension  08/11/2015   .  Hypernatremia  08/10/2015   .  Encephalopathy  08/10/2015   .  Acute on chronic renal failure ([place])  08/10/2015   .  Chronic diastolic congestive heart failure ([place])  08/10/2015   .  Persistent atrial fibrillation ([place])     .  Acute diastolic congestive heart failure ([place])     .  Dyspnea     .  Disorientation     .  Slow transit constipation     .  Back pain without radiation     .  Counseling regarding advanced care planning and goals of care     .  PAF (paroxysmal atrial fibrillation) ([place])  07/29/2015   .  Advanced care planning/counseling discussion     .  Pneumonia     .  Change in mental state     .  Endotracheally intubated     .  CHF (congestive heart failure) ([place])     .  Palliative care encounter  07/21/2015   .  DNR (do not resuscitate) discussion  07/21/2015   .  HCAP (healthcare-associated pneumonia)     .  Acute respiratory failure ([place])     .  Acute respiratory failure with hypoxia ([place])  07/18/2015   .  Atrial fibrillation with RVR ([place])  07/18/2015   .  Acute pulmonary edema ([place])  07/18/2015   .  Sepsis ([place])  07/17/2015   .  Acute encephalopathy  07/17/2015   .  LLL pneumonia  07/17/2015   .  Type II or unspecified type diabetes mellitus without mention of complication, not stated as uncontrolled  07/17/2015   .  S/P lumbar laminectomy  07/17/2015   .  Elevated troponin  07/17/2015   .  Type 2 diabetes mellitus without complication, without long-term  current use of insulin ([place])     .  Acute bronchitis  03/05/2015   .  Back pain, thoracic  01/15/2015   .  Chest wall pain  01/15/2015   .  Routine general medical examination at a health care facility  11/23/2014   .  Productive cough  10/14/2014   .  Left flank pain  09/25/2014   .  Renal insufficiency  11/14/2013   .  Encounter for Medicare annual wellness exam  07/07/2013   .  BPH (benign prostatic hyperplasia)  07/05/2012   .  Prostate cancer screening  06/27/2012   .  Low TSH level  01/22/2012   .  PMR (polymyalgia rheumatica) ([facility])  01/20/2012   .  Aortic stenosis  07/08/2011   .  New onset a-fib ([place])  07/05/2011       Class: Acute   .  HYPERKALEMIA  02/18/2010   .  Essential hypertension, benign  09/13/2009   .  ANEMIA-UNSPECIFIED  06/10/2009   .  ANGIODYSPLASIA-INTESTINE  06/10/2009   .  MURMUR  05/21/2009   .  Left carotid bruit  05/21/2009   .  Coronary atherosclerosis  03/09/2009   .  GERD  03/09/2009   .  Rheumatoid arthritis ([place])  04/29/2008   .  HX, PERSONAL, COLONIC POLYPS  01/17/2007   .  Diabetes mellitus type 2, controlled, without complications ([place])  [id number]   .  Elevated lipids  09/18/2006   .  PSORIATIC ARTHRITIS  09/18/2006   .  PSORIASIS  09/18/2006   .  ACTINIC KERATOSIS  09/18/2006   .  DEGENERATIVE DISC DISEASE  09/18/2006   .  SCOLIOSIS  09/18/2006   .  TOBACCO ABUSE, HX OF  09/18/2006     Expected Discharge Date: Expected Discharge Date: 09/02/15  Team Members Present: Physician leading conference: Dr. Delice Lesch Social Worker Present: Lennart Pall, LCSW Nurse Present: Dorien Chihuahua, RN PT Present: Carney Living, PT OT Present: Roanna Epley, [place], OT SLP Present: Weston Anna, SLP PPS Coordinator present : Daiva Nakayama, RN, CRRN        Current Status/Progress  Goal  Weekly Team Focus   Medical     Debilitation and cognitive deficits secondary to encephalopathy and multiple medical complications after lumbar discectomy    Improve orthostasis, CBGs, AKI  see above   Bowel/Bladder     Continent of bowel and bladder. LBM 08/23/15   Pt to remain continent of bowel and bladder   Monitor   Swallow/Nutrition/ Hydration     Dys. 1 textures with honey-thick liquids via tsp, Mod A for use of swallow strategies   Min A with least restrictive diet  repeat MBS tomorrow, increase use of swallowing strategies    ADL's     min A/supervision overall; decreased activity tolerance;   mod I overall  activity tolerance, standing balance, functional transfers, family education   Mobility     S-min A, significantly decreased activity tolerance   mod I overall, supervision stairs and car  standing balance, functional mobility training, activity tolerance, pt/family education   Communication               Safety/Cognition/ Behavioral Observations    Mod A  Min A   functional problem solving, recall and attention    Pain     No c/o pain  <3  Monitor for nonverbal cues of pain    Skin     Stage 2 w/ Allevyn dressing to area   No additional skin breakdown  Monitor    Rehab Goals Patient on target to meet rehab goals: Yes Rehab Goals Revised: On track to meet supervision level goals. *See Care Plan and progress notes for long and short-term goals.    Barriers to Discharge:  Orthostasis, AKI, DM, IVF     Possible Resolutions to Barriers:   TEDS, monitor labs, IVF recently d/ced, transition to home DM meds     Discharge Planning/Teaching Needs:   Home with wife and family arranging 24/7 assist/ supervision.  to be scheduled prior to d/c    Team Discussion:    Still watching INR;  IVF stopped.  Easily becomes out of breath and fatigued;  Ted hose added yesterday due to LE swelling.  Poor endurance and very poor memory which combine to affect his safety and anticipate will need supervision.  Plan to repeat MBS tomorrow (pt has been a silent aspirator).  Stage 2 sacrum & open area at base of incision - MD aware.     Revisions  to Treatment Plan:    None    Continued Need for Acute Rehabilitation Level of Care: The patient requires daily medical management by a physician with specialized training in physical medicine and rehabilitation for the following conditions: Daily direction of a multidisciplinary physical rehabilitation program to ensure safe treatment while eliciting the highest outcome that is of practical value to the patient.: Yes Daily medical management of patient stability for increased activity during participation in an intensive rehabilitation regime.: Yes Daily analysis of laboratory values and/or radiology reports with any subsequent need for medication adjustment of medical intervention for : Pulmonary problems;Post surgical problems;Diabetes problems  Melek Pownall 08/24/2015, 1:18 PM

## 2015-09-01 NOTE — Progress Notes (Signed)
Physical Therapy Discharge Summary  Patient Details  Name: Curtis Frazier MRN: [id number] Date of Birth: 10-Sep-1940  Today's Date: 09/01/2015 PT Individual Time: 0915-1000 PT Individual Time Calculation (min): 45 min    Patient has met 11 of 11 long term goals due to improved activity tolerance, improved balance, improved postural control, increased strength, ability to compensate for deficits and improved coordination.  Patient to discharge at a household ambulatory level Supervision. Patient's care partner is independent to provide the necessary physical assistance at discharge.  Reasons goals not met: NA  Recommendation:  Patient will benefit from ongoing skilled PT services in home health setting to continue to advance safe functional mobility, address ongoing impairments in strength, balance, activity tolerance, R hip pain, and minimize fall risk.  Equipment: 18 x 18 manual wheelchair  Reasons for discharge: treatment goals met and discharge from hospital  Patient/family agrees with progress made and goals achieved: Yes  Skilled Therapeutic Intervention Patient at overall supervision level using RW for gait on level and unlevel surfaces, stairs using 2 rails, 8" curb to simulate home entry, furniture transfers from low compliant couch surface, simulated car transfer to sedan height, and bed mobility on regular bed in ADL apartment. Patient continues to require verbal cues for keeping RW closer to body for safety, upright posture, and utilization of energy conservation techniques. Reviewed wheelchair parts management and how to get wheelchair in/out of car with family member who demonstrated understanding. Patient demonstrates improvement on FTSS with BUE support = 17 sec from 82 sec with min A on 08/20/15 and improvement on Berg Balance Scale with score of 25/56 improved from 15/56 on 08/22/15, although patient remains at high risk for falls. Patient instructed in [place] using provided  handout using RW with UE support, patient educated to perform exercises with HHPT or family members only, not independently.  Reinforced recommendation for supervision with standing tasks/ambulation for safety and keeping urinal/BSC next to bed at night. Patient and family verbalized understanding and with no further questions/concerns regarding discharge home.   PT Discharge Precautions/Restrictions Precautions Precautions: Back;Fall Restrictions Weight Bearing Restrictions: No Pain Pain Assessment Pain Assessment: 0-10 Pain Score: 5  Pain Type: Acute pain Pain Location: Hip Pain Orientation: Right Pain Descriptors / Indicators: Aching;Guarding Pain Onset: With Activity Pain Intervention(s): Rest;Repositioned Vision/Perception   No change from baseline  Cognition Overall Cognitive Status: Impaired/Different from baseline Arousal/Alertness: Awake/alert Orientation Level: Oriented X4 Attention: Alternating Selective Attention: Appears intact Alternating Attention: Appears intact Memory: Impaired Memory Impairment: Decreased recall of new information;Decreased short term memory Decreased Short Term Memory: Verbal basic Awareness: Appears intact Safety/Judgment: Appears intact Sensation Sensation Light Touch: Appears Intact Hot/Cold: Appears Intact Proprioception: Appears Intact Coordination Gross Motor Movements are Fluid and Coordinated: Yes Fine Motor Movements are Fluid and Coordinated: No Motor  Motor Motor: Within Functional Limits Motor - Discharge Observations: generalized weakness and deconditioning  Mobility Bed Mobility Bed Mobility: Supine to Sit;Rolling Right;Rolling Left;Sit to Supine Rolling Right: 6: Modified independent (Device/Increase time) Rolling Left: 6: Modified independent (Device/Increase time) Supine to Sit: 6: Modified independent (Device/Increase time) Sit to Supine: 6: Modified independent (Device/Increase time) Transfers Transfers:  Yes Sit to Stand: 6: Modified independent (Device/Increase time);With upper extremity assist;With armrests Stand to Sit: 6: Modified independent (Device/Increase time);With upper extremity assist;With armrests Locomotion  Ambulation Ambulation: Yes Ambulation/Gait Assistance: 5: Supervision Ambulation Distance (Feet): 180 Feet Assistive device: Rolling walker Gait Gait: Yes Gait Pattern: Impaired Gait Pattern: Step-through pattern;Trunk flexed;Antalgic;Decreased trunk rotation Gait velocity: 10 MWT =  0.67 m/s  Stairs / Additional Locomotion Stairs: Yes Stairs Assistance: 5: Supervision Stair Management Technique: Two rails;Step to pattern Number of Stairs: 12 Height of Stairs: 3 (8 3", 4 6") Ramp: 5: Supervision Curb: 5: Supervision Wheelchair Mobility Wheelchair Mobility: No  Trunk/Postural Assessment  Cervical Assessment Cervical Assessment: Within Functional Limits Thoracic Assessment Thoracic Assessment: Exceptions to [facility] (kyphotic) Lumbar Assessment Lumbar Assessment: Exceptions to [facility] (back precautions, posterior pelvic tilt) Postural Control Postural Control: Within Functional Limits  Balance Standardized Balance Assessment Standardized Balance Assessment: Berg Balance Test Berg Balance Test Sit to Stand: Able to stand  independently using hands Standing Unsupported: Able to stand 30 seconds unsupported Sitting with Back Unsupported but Feet Supported on Floor or Stool: Able to sit safely and securely 2 minutes Stand to Sit: Controls descent by using hands Transfers: Able to transfer with verbal cueing and /or supervision Standing Unsupported with Eyes Closed: Able to stand 10 seconds with supervision Standing Ubsupported with Feet Together: Able to place feet together independently and stand for 1 minute with supervision From Standing, Reach Forward with Outstretched Arm: Can reach forward >12 cm safely (5") From Standing Position, Pick up Object from Floor:  Unable to try/needs assist to keep balance (back precautions) From Standing Position, Turn to Look Behind Over each Shoulder: Turn sideways only but maintains balance (limited by back precautions) Turn 360 Degrees: Needs assistance while turning Standing Unsupported, Alternately Place Feet on Step/Stool: Needs assistance to keep from falling or unable to try Standing Unsupported, One Foot in Front: Loses balance while stepping or standing Standing on One Leg: Unable to try or needs assist to prevent fall Total Score: 25 Static Standing Balance Static Standing - Level of Assistance: 6: Modified independent (Device/Increase time) Dynamic Standing Balance Dynamic Standing - Level of Assistance: 5: Stand by assistance  Five times Sit to Stand Test (FTSS) Method: Use a straight back chair with a solid seat that is 16-18" high. Ask participant to sit on the chair with arms folded across their chest.  Instructions: "Stand up and sit down as quickly as possible 5 times, keeping your arms folded across your chest."  Measurement: Stop timing when the participant stands the 5th time.  TIME: __17 with BUE support____ (in seconds)  Times > 13.6 seconds is associated with increased disability and morbidity (Guralnik, 2000) Times > 15 seconds is predictive of recurrent falls in healthy individuals aged 64 and older (Buatois, et al., 2008) Normal performance values in community dwelling individuals aged 51 and older ([place]annon, 2006):  60-69 years: 11.4 seconds  70-79 years: 12.6 seconds  80-89 years: 14.8 seconds Extremity Assessment  RUE Assessment RUE Assessment: Within Functional Limits LUE Assessment LUE Assessment: Within Functional Limits RLE Assessment RLE Assessment: Exceptions to [facility] RLE Strength RLE Overall Strength: Deficits RLE Overall Strength Comments: 3+/5 hip flexion and ankle DF, 5/5 knee flexion/extension LLE Assessment LLE Assessment: Exceptions to [facility] LLE  Strength LLE Overall Strength: Deficits LLE Overall Strength Comments: 3+/5 hip flexion and ankle DF, 5/5 knee flexion/extension   See Function Navigator for Current Functional Status.  Carney Living A 09/01/2015, 10:06 AM

## 2015-09-01 NOTE — Progress Notes (Signed)
ANTICOAGULATION CONSULT NOTE - Follow Up Consult  Pharmacy Consult for Coumadin, Lovenox Indication: atrial fibrillation  Allergies  Allergen Reactions  . Ace Inhibitors Other (See Comments)    Causes high K  . Angiotensin Receptor Blockers Other (See Comments)    Causes high K  . Metoprolol Other (See Comments)    May cause elevated K level  . Penicillins Swelling and Rash  . Tetracycline Swelling and Rash    Patient Measurements: Weight: 169 lb 12.8 oz (77.021 kg) Heparin Dosing Weight:   Vital Signs: Temp: 98.3 F (36.8 C) (03/22 0500) Temp Source: Oral (03/22 0500) BP: 109/58 mmHg (03/22 0500) Pulse Rate: 86 (03/22 0500)  Labs:  Recent Labs  08/30/15 0635 08/31/15 0610 09/01/15 0637  LABPROT 21.9* 20.8* 23.5*  INR 1.93* 1.80* 2.11*  CREATININE  --  1.37* 1.38*    Estimated Creatinine Clearance: 44.1 mL/min (by C-G formula based on Cr of 1.38).   Medications:  Scheduled:  . amiodarone  400 mg Oral Daily  . antiseptic oral rinse  7 mL Mouth Rinse BID  . atorvastatin  20 mg Oral q1800  . feeding supplement (PRO-STAT SUGAR FREE 64)  30 mL Oral BID  . ferrous sulfate  325 mg Oral TID WC  . glipiZIDE  5 mg Oral QAC breakfast  . insulin aspart  0-15 Units Subcutaneous TID WC  . insulin aspart  0-5 Units Subcutaneous QHS  . pantoprazole  40 mg Oral Daily  . predniSONE  10 mg Oral Q breakfast  . senna  2 tablet Oral Daily  . thiamine  100 mg Oral Daily  . warfarin  5 mg Oral q1800  . Warfarin - Pharmacist Dosing Inpatient   Does not apply q1800    Assessment: 75yo on Coumadin for AFib, s/p DCCV, and receiving Amiodarone.  INR 2.11 this AM.  No CBC, no bleeding noted.  BMet this AM with Cr 1.38 and K 5.  Pt receiving no K supplements  Goal of Therapy:  INR 2-3 Monitor platelets by anticoagulation protocol: Yes   Plan:  1-  D/C Lovenox 2-  Continue Coumadin 5mg  daily 3-  Daily INR 4-  Watch for s/s of bleeding  Gracy Bruins, PharmD [facility]

## 2015-09-02 LAB — GLUCOSE, CAPILLARY
GLUCOSE-CAPILLARY: 117 mg/dL — AB (ref 65–99)
Glucose-Capillary: 108 mg/dL — ABNORMAL HIGH (ref 65–99)

## 2015-09-02 LAB — PROTIME-INR
INR: 2.12 — ABNORMAL HIGH (ref 0.00–1.49)
Prothrombin Time: 23.5 seconds — ABNORMAL HIGH (ref 11.6–15.2)

## 2015-09-02 MED ORDER — GLIPIZIDE 5 MG PO TABS: 5.0000 mg | ORAL_TABLET | Freq: Every day | ORAL | Status: DC

## 2015-09-02 MED ORDER — SENNA 8.6 MG PO TABS: 2.0000 | ORAL_TABLET | Freq: Every day | ORAL | Status: DC

## 2015-09-02 MED ORDER — AMIODARONE HCL 400 MG PO TABS: 400.0000 mg | ORAL_TABLET | Freq: Every day | ORAL | Status: DC

## 2015-09-02 MED ORDER — WARFARIN SODIUM 5 MG PO TABS: 5.0000 mg | ORAL_TABLET | Freq: Every day | ORAL | Status: DC

## 2015-09-02 MED ORDER — THIAMINE HCL 100 MG PO TABS: 100.0000 mg | ORAL_TABLET | Freq: Every day | ORAL | Status: DC

## 2015-09-02 MED ORDER — PANTOPRAZOLE SODIUM 40 MG PO TBEC: 40.0000 mg | DELAYED_RELEASE_TABLET | Freq: Every day | ORAL | Status: DC

## 2015-09-02 MED ORDER — FERROUS SULFATE 325 (65 FE) MG PO TABS: 325.0000 mg | ORAL_TABLET | Freq: Three times a day (TID) | ORAL | Status: DC

## 2015-09-02 NOTE — Progress Notes (Addendum)
ANTICOAGULATION CONSULT NOTE - Follow Up Consult  Pharmacy Consult for Coumadin Indication: atrial fibrillation  Allergies  Allergen Reactions  . Ace Inhibitors Other (See Comments)    Causes high K  . Angiotensin Receptor Blockers Other (See Comments)    Causes high K  . Metoprolol Other (See Comments)    May cause elevated K level  . Penicillins Swelling and Rash  . Tetracycline Swelling and Rash    Patient Measurements: Weight: 166 lb 3.2 oz (75.388 kg) Heparin Dosing Weight:   Vital Signs: Temp: 98.6 F (37 C) (03/23 0506) Temp Source: Oral (03/23 0506)  Labs:  Recent Labs  08/31/15 0610 09/01/15 0637 09/02/15 0445  LABPROT 20.8* 23.5* 23.5*  INR 1.80* 2.11* 2.12*  CREATININE 1.37* 1.38*  --     Estimated Creatinine Clearance: 44.1 mL/min (by C-G formula based on Cr of 1.38).   Medications:  Scheduled:  . amiodarone  400 mg Oral Daily  . antiseptic oral rinse  7 mL Mouth Rinse BID  . atorvastatin  20 mg Oral q1800  . feeding supplement (PRO-STAT SUGAR FREE 64)  30 mL Oral BID  . ferrous sulfate  325 mg Oral TID WC  . glipiZIDE  5 mg Oral QAC breakfast  . insulin aspart  0-15 Units Subcutaneous TID WC  . insulin aspart  0-5 Units Subcutaneous QHS  . pantoprazole  40 mg Oral Daily  . predniSONE  10 mg Oral Q breakfast  . senna  2 tablet Oral Daily  . thiamine  100 mg Oral Daily  . warfarin  5 mg Oral q1800  . Warfarin - Pharmacist Dosing Inpatient   Does not apply q1800    Assessment: 75yo male with AFib, s/p DCCV & receiving Amiodarone.  INR 2.12 this AM, no CBC & no bleeding.  Pt appears to be going home today, but no d/c orders at this time.  Pt was on a lower dose at home, but has been requiring more Coumadin this admission.  Goal of Therapy:  INR 2-3 Monitor platelets by anticoagulation protocol: Yes   Plan:  Coumadin 5mg  daily Recommend next INR on Mon 3/27 Daily INR while in-house Watch for s/s of bleeding  Gracy Bruins,  PharmD [facility]

## 2015-09-02 NOTE — Progress Notes (Signed)
Patient with daughter at bedside with given discharge information by Algis Liming, PA., all questions answered. Skin assessed by Algis Liming, PA and patient's daughter educated to assess incision line, and treatment of dry skin. All belongings packed by daughter, and taken with patient. Patient assisted to car by Lavella Lemons, [place].

## 2015-09-02 NOTE — Progress Notes (Signed)
[facility] PHYSICAL MEDICINE & REHABILITATION     PROGRESS NOTE  Subjective/Complaints: No new issues. Happy to be going home.  ROS: Denies CP, SOB, N/V/D.  Objective: Vital Signs: Blood pressure 107/51, pulse 92, temperature 98.6 F (37 C), temperature source Oral, resp. rate 17, weight 75.388 kg (166 lb 3.2 oz), SpO2 96 %. Dg Hip Unilat With Pelvis 2-3 Views Right  09/01/2015  CLINICAL DATA:  No known injury, right hip pain EXAM: DG HIP (WITH OR WITHOUT PELVIS) 2-3V RIGHT COMPARISON:  None. FINDINGS: Mild narrowing of both hips was mild right osteophyte formation. No fracture or dislocation. Pelvic bones intact. Lumbar spinal rods partially visualized. Calcification of the aortoiliac and femoral arteries bilaterally. IMPRESSION: No acute findings Electronically Signed   By: Skipper Cliche M.D.   On: 09/01/2015 11:45   No results for input(s): WBC, HGB, HCT, PLT in the last 72 hours.  Recent Labs  08/31/15 0610 09/01/15 0637  NA 138 140  K 4.7 5.0  CL 106 108  GLUCOSE 119* 123*  BUN 20 20  CREATININE 1.37* 1.38*  CALCIUM 8.9 8.9   CBG (last 3)   Recent Labs  09/01/15 1617 09/01/15 2056 09/02/15 0643  GLUCAP 226* 98 108*    Wt Readings from Last 3 Encounters:  09/02/15 75.388 kg (166 lb 3.2 oz)  08/17/15 75.3 kg (166 lb 0.1 oz)  08/03/15 80.1 kg (176 lb 9.4 oz)    Physical Exam:  Constitutional: He appears well-developed and well-nourished. No acute distress. Vital signs reviewed HENT: Normocephalic. Atraumatic Eyes: Conjunctivae and EOM are normal. No drainage or irritation.  Neck: Normal range of motion. Neck supple.  Cardiovascular: Normal rate. An irregularly irregular rhythm present. No murmur Respiratory: Effort normal and breath sounds normal. No respiratory distress. He has no wheezes. He exhibits no tenderness.  GI: He exhibits no distension. There is no tenderness.  Musculoskeletal: He exhibits no edema or tenderness.  Neurological: He is alert and  oriented 3. Appropriate with conversation. Reasonable insight B/l UE 5/5 prox to distal.  B/l LE: 4 -/5 hip flexion, knee extension 4 /5 ankle dorsi/plantarflexion Skin: Skin is warm and dry. He is not diaphoretic.  Dry  skin.  Back incision healed   Psychiatric: He has a normal mood and affect. His behavior is normal.   Assessment/Plan: 1. 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 Physiatrist is providing close team supervision and 24 hour management of active medical problems listed below. Physiatrist and rehab team continue to assess barriers to discharge/monitor patient progress toward functional and medical goals.  Function:  Bathing Bathing position Bathing activity did not occur: Refused Position: Production manager parts bathed by patient: Right arm, Left arm, Chest, Abdomen, Front perineal area, Buttocks, Right upper leg, Left upper leg, Right lower leg, Left lower leg, Back Body parts bathed by helper: Back  Bathing assist Assist Level: Assistive device      Upper Body Dressing/Undressing Upper body dressing   What is the patient wearing?: Pull over shirt/dress     Pull over shirt/dress - Perfomed by patient: Thread/unthread right sleeve, Thread/unthread left sleeve, Put head through opening, Pull shirt over trunk          Upper body assist Assist Level: No help, No cues   Set up : To obtain clothing/put away  Lower Body Dressing/Undressing Lower body dressing   What is the  patient wearing?: Underwear, Pants, Socks, Shoes Underwear - Performed by patient: Thread/unthread right underwear leg, Pull underwear up/down, Thread/unthread left underwear leg Underwear - Performed by helper: Thread/unthread right underwear leg, Thread/unthread left underwear leg Pants- Performed by patient: Thread/unthread left pants leg,  Pull pants up/down, Thread/unthread right pants leg, Fasten/unfasten pants Pants- Performed by helper: Thread/unthread right pants leg Non-skid slipper socks- Performed by patient: Don/doff right sock, Don/doff left sock Non-skid slipper socks- Performed by helper: Don/doff right sock, Don/doff left sock Socks - Performed by patient: Don/doff right sock, Don/doff left sock   Shoes - Performed by patient: Don/doff right shoe, Don/doff left shoe Shoes - Performed by helper: Don/doff right shoe       TED Hose - Performed by helper: Don/doff right TED hose, Don/doff left TED hose  Lower body assist Assist for lower body dressing: Assistive device, More than reasonable time      Toileting Toileting   Toileting steps completed by patient: Adjust clothing prior to toileting, Performs perineal hygiene, Adjust clothing after toileting Toileting steps completed by helper: Adjust clothing prior to toileting, Performs perineal hygiene, Adjust clothing after toileting Toileting Assistive Devices: Grab bar or rail  Toileting assist Assist level: More than reasonable time, No help/no cues   Transfers Chair/bed transfer   Chair/bed transfer method: Stand pivot Chair/bed transfer assist level: No Help, no cues, assistive device, takes more than a reasonable amount of time Chair/bed transfer assistive device: Armrests, Medical sales representative     Max distance: 180 Assist level: Supervision or verbal cues   Wheelchair   Type: Manual Max wheelchair distance: 150 Assist Level: Supervision or verbal cues  Cognition Comprehension Comprehension assist level: Follows basic conversation/direction with extra time/assistive device  Expression Expression assist level: Expresses complex 90% of the time/cues < 10% of the time  Social Interaction Social Interaction assist level: Interacts appropriately 90% of the time - Needs monitoring or encouragement for participation or interaction.  Problem  Solving Problem solving assist level: Solves basic 75 - 89% of the time/requires cueing 10 - 24% of the time  Memory Memory assist level: Recognizes or recalls 75 - 89% of the time/requires cueing 10 - 24% of the time   Medical Problem List and Plan: 1. 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 2. DVT Prophylaxis/Anticoagulation: Pharmaceutical: Coumadin per pharmacy protocol.  .    3. Chronic back pain/Pain Management: tylenol only at present  -having increased right hip pain.     -hip xr with degenerative changes   -resume MTX at home 4. Mood: LCSW to follow for evaluation and support.  5. Neuropsych: This patient is not yet capable of making decisions on his own behalf. 6. Skin/Wound Care: Dry dressing daily. Monitor wound for healing. Added protein supplement to help with low protein stores 7. Fluids/Electrolytes/Nutrition: Continue IVF at night to maintain adequate hydration status.  8. Acute renal failure: improved. Encourage po. On regular diet  -Cr 1.38    -will check BUN/CR at home via [facility] 9. A fib with RVR: HR in 80's.  On amiodarone and coumadin  10. PMR/RA: Has completed stress dose steroids--now on maintainance dose.  -  MTX  11. Anemia of chronic illness/iron deficiency: On iron supplement. hgb 11.2 12. GERD: protonix 13. DM2: Currently on novolog 3u bid with breakfast and  lunch.   -used glucotrol 5 mg daily at home--resumed with reasonable control  -off novolog  -further adjustments once home 14. CAD: Off ASA/plavix-- now on coumadin and lipitor.  15. Dysphagia: regular and thins          16. Cough: no signs this morning  -encourage IS, OOB.   LOS (Days) 16 A FACE TO FACE EVALUATION WAS PERFORMED  Philippa Vessey T 09/02/2015 8:31 AM

## 2015-09-02 NOTE — Progress Notes (Signed)
Speech Language Pathology Discharge Summary  Patient Details  Name: Curtis Frazier MRN: [id number] Date of Birth: September 29, 1940  Patient has met 5 of 5 long term goals.  Patient to discharge at overall Supervision;Min level.   Reasons goals not met: N/A    Clinical Impression/Discharge Summary: Patient has made functional gains and has met 5 of 5 LTG's this admission due to improved swallowing and cognitive function. Currently, patient is consuming regular textures with thin liquids via cup with minimal overt s/s of aspiration and requires supervision verbal cues for use of swallowing compensatory strategies (multiple swallows, chin tuck with liquids, intermittent cough/throat clear). Patient also requires supervision-Min A verbal cues to complete functional and familiar tasks safely in regards to problem solving, attention, recall and awareness of errors. Patient and family education is complete and patient will discharge home with 24 hour supervision from family. Patient would benefit from f/u SLP services to maximize cognitive and swallowing function and overall functional independence in order to reduce caregiver burden.    Care Partner:  Caregiver Able to Provide Assistance: Yes  Type of Caregiver Assistance: Physical;Cognitive  Recommendation:  Home Health SLP;24 hour supervision/assistance  Rationale for SLP Follow Up: Maximize swallowing safety;Maximize cognitive function and independence;Reduce caregiver burden   Equipment: N/A   Reasons for discharge: Treatment goals met;Discharged from hospital   Patient/Family Agrees with Progress Made and Goals Achieved: Yes     [place], [place] 09/02/2015, 7:27 AM

## 2015-09-03 ENCOUNTER — Telehealth: Payer: Self-pay | Admitting: *Deleted

## 2015-09-03 ENCOUNTER — Telehealth: Payer: Self-pay | Admitting: Cardiovascular Disease

## 2015-09-03 LAB — AFB CULTURE WITH SMEAR (NOT AT ARMC)
ACID FAST SMEAR: NONE SEEN
Special Requests: NORMAL

## 2015-09-03 NOTE — Telephone Encounter (Signed)
Please ok that verbal order  

## 2015-09-03 NOTE — Telephone Encounter (Signed)
Estill Bamberg [facility]) is calling because Mr. Urben just got out the hospital and is on coumadin and wants to know if she is to monitor his PT/Inr . Please call   Thanks

## 2015-09-03 NOTE — Discharge Summary (Signed)
Curtis Frazier, Curtis Frazier NO.:  [id number]  MEDICAL RECORD NO.:  [id number]  LOCATION:  4W09C                        FACILITY:  [facility]  PHYSICIAN:  Meredith Staggers, M.D.DATE OF BIRTH:  01-20-1941  DATE OF ADMISSION:  08/17/2015 DATE OF DISCHARGE:  09/02/2015                              DISCHARGE SUMMARY   DISCHARGE DIAGNOSES/PRIMARY DIAGNOSES: 1. Debility with encephalopathy due to multiple medical issues. 2. Acute renal failure, resolved. 3. Polymyalgia rheumatica. 4. Atrial fibrillation with rapid ventricular response. 5. Diabetes mellitus type 2. 6. Dysphagia. 7. Right hip pain.  HISTORY OF PRESENT ILLNESS:  [name] is a 75 year old male with history of coronary artery disease, RA, lumbar stenosis with outpatient lumbar diskectomy in 2017.  He was discharged to home but readmitted on February 4, with sepsis due to PNE, mental status changes as well as AFib with RVR.  Hospital course was complicated by VDRL, acute on chronic CHF as well as AFib with RVR requiring cardioversion.  He was noted to be hypoxic and severely deconditioned, had issues with confusion, and encephalopathy and was transferred to [facility] on February 21.  He was readmitted to acute hospital on February 28 with ongoing encephalopathy, worsening of lethargy, and poor p.o. intake.  He was started on IV fluids for acute on chronic renal failure with hyponatremia and for management of hypotension and sepsis.  MRI of brain done showed no acute changes.  EEG showed changes consistent with metabolic derangement.  The patient has had issues with orthostatic changes and BP meds were held and he was treated with stress dose steriods.  Acute renal failure has resolved with IV fluid hydration.  He continues to have issues with dysphagia and is currently on dysphagia 1 with honey liquids by teaspoon.  He was noted to have some superficial dehiscence of back wound and Dr. Arnoldo Morale recommended  dry dressing changes daily. Therapies are ongoing and patient is noted to be deconditioned.  CIR was recommended for followup therapy.  HOSPITAL COURSE:  [name] was admitted to Rehab on August 17, 2015, for inpatient therapies to consist of PT, OT, and speech therapy at least 3 hours 5 days a week.  Past admission, physiatrist, rehab, RN, and therapy team have worked together to provide customized collaborative interdisciplinary care.  Rehab RN has worked with the patient on pressure relief measures as well as wound monitoring.  The patient's back incision has been clean and dry.  Steri-Strips were applied to the dehisced area and wound has healed up without signs or symptoms of infection. He has had issues with poor p.o. intake and was treated with nocturnal IVF for hydration. Heart rate has been has been stable on amiodarone.  Blood pressures were monitored on b.i.d.basis and have been relatively stable off meds.  Orthostatic symptoms have resolved as activity tolerance and p.o. intake improved.  Routine labs were monitored during this stay and IVF were discontinued as po intake improved.  Most recent check of lytes showed creatinine to be stable at 1.38.  He will need recheck of lytes in 1-2 weeks past discharge.  He was maintained on Coumadin throughout his stay.  Pharmacy  has been helping to adjust and manage Coumadin dosing during his rehab stay.  PT/INR at the time of discharge is therapeutic at 23.5/2.12 and the patient is discharged on 5 mg of Coumadin per day.  He is to have labs rechecked on Monday with results to be sent to [facility], who will adjust and manage Coumadin after discharge.  The patient's diabetes has been monitored with a.c./h.s. CBG checks. Blood sugars have steadily improved and Lantus was discontinued. Glucotrol was resumed at 5 mg per day and blood sugars have been stable overall.  The patient's mentation and energy levels have  gradually improved.  He has had complaints of increase in right hip pain with activity.  Hip pain has been managed with the use of Tylenol as well as ice and heat.  Hip x-rays have shown evidence of degenerative changes.  The patient reported that he was due for his Remicade infusion and has been advised to follow up with Dr. Amil Amen for input regarding dose of methotrexate to resume after discharge as well as appointment for the follow up on Remicade treatment.  He continues on iron supplements for his anemia and hemoglobin has improved to 11.2.  During the patient's stay in rehab, weekly team conferences were held to monitor the patient's progress, set goals and discuss barriers to discharge.  At admission, therapy evaluations revealed deficits in balance endurance as well as muscle weakness.  He required moderate assist with mobility and basic self-care tasks.  He was initially on dysphagia 1 diet honey liquids with dysphagia treatment ongoing.  He had mild cognitive impairments affecting attention, recall of daily information as well as impairment in short-term memory.  He has  had improvement in activity tolerance, balance, postural control, as  well as ability to compensate for his deficits.  He has made great progress during his stay in rehab.  He is currently able to use compensatory swallow strategies with supervision and cues.  He requires supervision to min assist to complete functional and familiar tasks due to ongoing issues with problem solving attention as well as decreased awareness of deficits.  He is currently able to complete bathing, dressing, and toileting at modified independent level.  He does require supervision for tub, shower transfers.  He continues to have easy fatigability and required multiple rest breaks during ADL tasks.  He is currently modified independent for transfers.  He is able to ambulate 180 feet with supervision with use of rolling walker.  Family education was  completed with multiple family members.  Family has been very supportive and has been present for multiple therapies.  He will continue to require 24-hour supervision past discharge.  He will continue to receive followup [place], OT, ST and Home Health RN by [facility] after discharge.  Home Health RN to draw ProTime on March 29, with results to Dr. Kyla Balzarine office.  DISPOSITION:  Home.  DIET:  Heart healthy and diabetic restrictions, 4 gram salt restrictions.  WOUND CARE:  Keep wound clean and dry.  ACTIVITY LEVEL:  No strenuous activity.  No driving.  No bending, twisting, or arching.  SPECIAL INSTRUCTIONS: 1. Wear support stockings during the day.  Keep legs elevated when     seated to help with edema control. 2. Dr. Johnsie Cancel' office to manage Coumadin and they will call you after     ProTime drawn on Monday. 3. May resume methotrexate after discharge.  Can discuss changes with     the rheumatologist.  FOLLOWUP: 1. Follow up with Dr. Glori Bickers on April 4th at 3:15 for posthospital     followup. 2. Call Dr. Newman Pies for postop check. 3. Follow up with Dr. Jenkins Rouge for input regarding Coumadin as     well as adjustment of further cardiac medicines. 4. Follow up with Dr. Alger Simons in 2 weeks.  Office will call you     with an appointment.     Reesa Chew, P.A.   ______________________________ Meredith Staggers, M.D.    PL/MEDQ  D:  09/03/2015  T:  09/03/2015  Job:  [id number]  cc:   Ophelia Charter, M.D. Leigh Aurora, MD

## 2015-09-03 NOTE — Telephone Encounter (Signed)
Estill Bamberg with [facility], requesting verbal order for nursing orders for medication management and to monitor his labs (PT/INR), she is requesting 3 x a week for 1 week, and 2 x in the next 5 weeks, and then 2 prn visits for any med changes

## 2015-09-03 NOTE — Telephone Encounter (Signed)
Transition Care Management Follow-up Telephone Call   Date discharged? 09/02/15   How have you been since you were released from the hospital? Doing well - much improved.   Do you understand why you were in the hospital? yes   Do you understand the discharge instructions? yes   Where were you discharged to? home   Items Reviewed:  Medications reviewed: yes  Allergies reviewed: yes  Dietary changes reviewed: no  Referrals reviewed: yes, cardiology   Functional Questionnaire:   Activities of Daily Living (ADLs):   He states they are independent in the following: ambulation, bathing and hygiene, feeding, continence, grooming, toileting and dressing States they require assistance with the following: none   Any transportation issues/concerns?: no   Any patient concerns? no   Confirmed importance and date/time of follow-up visits scheduled yes, 09/14/15 @ 1515  Provider Appointment booked with Loura Pardon, MD  Confirmed with patient if condition begins to worsen call PCP or go to the ER.  Patient was given the office number and encouraged to call back with question or concerns.  : yes

## 2015-09-03 NOTE — Progress Notes (Signed)
Social Work  Discharge Note  The overall goal for the admission was met for:   Discharge location: Yes - home with family to provide 24/7 assistance  Length of Stay: Yes - 16 days  Discharge activity level: Yes - met revised goals of supervision  Home/community participation: Yes  Services provided included: MD, RD, PT, OT, SLP, RN, TR, Pharmacy, [place]: Medicare and Private Insurance: [place]  Follow-up services arranged: Home Health: Therapist, sports, PT, OT, ST via [facility], DME: 18x18 lightweight w/c, cushion all via [place] and Patient/Family has no preference for HH/DME agencies  Comments (or additional information):  Patient/Family verbalized understanding of follow-up arrangements: Yes  Individual responsible for coordination of the follow-up plan: pt/ family  Confirmed correct DME delivered: Lennart Pall 09/03/2015    Mabry Santarelli

## 2015-09-03 NOTE — Telephone Encounter (Signed)
Called [place] with [employer] back. Informed her patient patient should be having a PT/INR drawn on Monday, according to discharge summary. Informed her to call Coumadin Clinic on Monday with results. Estill Bamberg verbalized understanding.

## 2015-09-05 ENCOUNTER — Encounter (HOSPITAL_COMMUNITY): Payer: Self-pay

## 2015-09-05 ENCOUNTER — Emergency Department (HOSPITAL_COMMUNITY): Payer: Medicare Other

## 2015-09-05 ENCOUNTER — Observation Stay (HOSPITAL_BASED_OUTPATIENT_CLINIC_OR_DEPARTMENT_OTHER)
Admission: EM | Admit: 2015-09-05 | Discharge: 2015-09-06 | Disposition: A | Discharge status: Home / Self Care | Payer: Medicare Other | Source: Home / Self Care | Attending: Physician Assistant | Admitting: Physician Assistant

## 2015-09-05 DIAGNOSIS — M353 Polymyalgia rheumatica: Secondary | ICD-10-CM | POA: Diagnosis present

## 2015-09-05 DIAGNOSIS — N183 Chronic kidney disease, stage 3 (moderate): Secondary | ICD-10-CM

## 2015-09-05 DIAGNOSIS — I2510 Atherosclerotic heart disease of native coronary artery without angina pectoris: Secondary | ICD-10-CM

## 2015-09-05 DIAGNOSIS — Z955 Presence of coronary angioplasty implant and graft: Secondary | ICD-10-CM

## 2015-09-05 DIAGNOSIS — I5032 Chronic diastolic (congestive) heart failure: Secondary | ICD-10-CM | POA: Insufficient documentation

## 2015-09-05 DIAGNOSIS — L408 Other psoriasis: Secondary | ICD-10-CM | POA: Diagnosis present

## 2015-09-05 DIAGNOSIS — Z87891 Personal history of nicotine dependence: Secondary | ICD-10-CM

## 2015-09-05 DIAGNOSIS — I481 Persistent atrial fibrillation: Secondary | ICD-10-CM

## 2015-09-05 DIAGNOSIS — I252 Old myocardial infarction: Secondary | ICD-10-CM

## 2015-09-05 DIAGNOSIS — Z7952 Long term (current) use of systemic steroids: Secondary | ICD-10-CM | POA: Insufficient documentation

## 2015-09-05 DIAGNOSIS — E1022 Type 1 diabetes mellitus with diabetic chronic kidney disease: Secondary | ICD-10-CM | POA: Diagnosis present

## 2015-09-05 DIAGNOSIS — M069 Rheumatoid arthritis, unspecified: Secondary | ICD-10-CM | POA: Diagnosis present

## 2015-09-05 DIAGNOSIS — I130 Hypertensive heart and chronic kidney disease with heart failure and stage 1 through stage 4 chronic kidney disease, or unspecified chronic kidney disease: Secondary | ICD-10-CM | POA: Insufficient documentation

## 2015-09-05 DIAGNOSIS — Z85828 Personal history of other malignant neoplasm of skin: Secondary | ICD-10-CM | POA: Insufficient documentation

## 2015-09-05 DIAGNOSIS — N179 Acute kidney failure, unspecified: Secondary | ICD-10-CM | POA: Insufficient documentation

## 2015-09-05 DIAGNOSIS — R262 Difficulty in walking, not elsewhere classified: Secondary | ICD-10-CM

## 2015-09-05 DIAGNOSIS — K219 Gastro-esophageal reflux disease without esophagitis: Secondary | ICD-10-CM

## 2015-09-05 DIAGNOSIS — E119 Type 2 diabetes mellitus without complications: Secondary | ICD-10-CM

## 2015-09-05 DIAGNOSIS — R531 Weakness: Secondary | ICD-10-CM | POA: Insufficient documentation

## 2015-09-05 DIAGNOSIS — K8050 Calculus of bile duct without cholangitis or cholecystitis without obstruction: Secondary | ICD-10-CM | POA: Diagnosis not present

## 2015-09-05 DIAGNOSIS — R079 Chest pain, unspecified: Secondary | ICD-10-CM | POA: Diagnosis present

## 2015-09-05 DIAGNOSIS — E785 Hyperlipidemia, unspecified: Secondary | ICD-10-CM

## 2015-09-05 DIAGNOSIS — Z794 Long term (current) use of insulin: Secondary | ICD-10-CM | POA: Insufficient documentation

## 2015-09-05 DIAGNOSIS — E1122 Type 2 diabetes mellitus with diabetic chronic kidney disease: Secondary | ICD-10-CM | POA: Insufficient documentation

## 2015-09-05 DIAGNOSIS — N182 Chronic kidney disease, stage 2 (mild): Secondary | ICD-10-CM | POA: Diagnosis present

## 2015-09-05 DIAGNOSIS — N400 Enlarged prostate without lower urinary tract symptoms: Secondary | ICD-10-CM | POA: Insufficient documentation

## 2015-09-05 DIAGNOSIS — L409 Psoriasis, unspecified: Secondary | ICD-10-CM | POA: Insufficient documentation

## 2015-09-05 DIAGNOSIS — N189 Chronic kidney disease, unspecified: Secondary | ICD-10-CM

## 2015-09-05 DIAGNOSIS — Z7901 Long term (current) use of anticoagulants: Secondary | ICD-10-CM | POA: Insufficient documentation

## 2015-09-05 DIAGNOSIS — R0789 Other chest pain: Secondary | ICD-10-CM

## 2015-09-05 DIAGNOSIS — Z8249 Family history of ischemic heart disease and other diseases of the circulatory system: Secondary | ICD-10-CM | POA: Insufficient documentation

## 2015-09-05 LAB — BASIC METABOLIC PANEL
ANION GAP: 9 (ref 5–15)
BUN: 21 mg/dL — ABNORMAL HIGH (ref 6–20)
CALCIUM: 8.7 mg/dL — AB (ref 8.9–10.3)
CO2: 21 mmol/L — AB (ref 22–32)
Chloride: 107 mmol/L (ref 101–111)
Creatinine, Ser: 1.82 mg/dL — ABNORMAL HIGH (ref 0.61–1.24)
GFR, EST AFRICAN AMERICAN: 40 mL/min — AB (ref >60)
GFR, EST NON AFRICAN AMERICAN: 34 mL/min — AB (ref >60)
Glucose, Bld: 216 mg/dL — ABNORMAL HIGH (ref 65–99)
Potassium: 4.7 mmol/L (ref 3.5–5.1)
SODIUM: 137 mmol/L (ref 135–145)

## 2015-09-05 LAB — CBC
HCT: 33.8 % — ABNORMAL LOW (ref 39.0–52.0)
HEMOGLOBIN: 11 g/dL — AB (ref 13.0–17.0)
MCH: 32.2 pg (ref 26.0–34.0)
MCHC: 32.5 g/dL (ref 30.0–36.0)
MCV: 98.8 fL (ref 78.0–100.0)
PLATELETS: 256 10*3/uL (ref 150–400)
RBC: 3.42 MIL/uL — AB (ref 4.22–5.81)
RDW: 16.1 % — ABNORMAL HIGH (ref 11.5–15.5)
WBC: 9.8 10*3/uL (ref 4.0–10.5)

## 2015-09-05 LAB — I-STAT TROPONIN, ED: TROPONIN I, POC: 0.01 ng/mL (ref 0.00–0.08)

## 2015-09-05 NOTE — ED Notes (Signed)
IV start attempted by two people, unsuccessful. See new orders

## 2015-09-05 NOTE — ED Provider Notes (Signed)
CSN: [id number]     Arrival date & time 09/05/15  2039 History  By signing my name below, I, Irene Pap, attest that this documentation has been prepared under the direction and in the presence of Merly Hinkson Julio Alm, MD. Electronically Signed: Irene Pap, ED Scribe. 09/05/2015. 2:19 AM.    Chief Complaint  Patient presents with  . Chest Pain   The history is provided by the patient. No language interpreter was used.  HPI Comments: Curtis Frazier is a 75 y.o. Male with a hx of HTN, chronic renal failure, stent placement x1, A-Fib, PMR, PTCA, MI, Type II DM, skin cancer, and coronary atherosclerosis who presents to the Emergency Department complaining of waxing and waning upper chest pain that radiates to the back onset one day ago, reoccurring 8 hours ago. Pt was recently admitted to the hospital and completed rehab, returning home 4 days ago. Pt is ambulatory by a walker. He reports mild cough and "feels like there's something stuck that I can't get up." Pt was given 81 mg aspirin when EMS arrived, but he did not want to be transported by EMS. Pt no longer takes Lasix or aspirin since leaving the hospital. He denies nausea, vomiting, diarrhea, or fever.  [id number] A-Fib with RBR on Coumadin  Past Medical History  Diagnosis Date  . Other and unspecified hyperlipidemia   . Coronary atherosclerosis of unspecified type of vessel, native or graft   . Personal history of colonic polyps   . Actinic keratosis   . Psoriatic arthropathy ([place])   . Other psoriasis   . Scoliosis (and kyphoscoliosis), idiopathic   . GI bleed 12/10    cecam AVM  . Gastritis 12/10  . Hyperlipidemia     takes Pravastatin daily  . Hypertension     takes Metoprolol daily  . Shortness of breath     with exertion  . Headache(784.0)     occasionally  . Joint pain   . Joint swelling   . Chronic back pain     scoliosis/stenosis/radiculopathy,degenerative disc disease  . Psoriasis   . Bruises easily   .  Esophageal reflux     takes Omeprazole daily  . History of colonic polyps   . Hemorrhoids   . Kidney stone     hx of  . Blood transfusion 2010  . PMR (polymyalgia rheumatica) ([facility]) 01/20/2012    tx by specialist on a long prednisone taper  . S/P PTCA (percutaneous transluminal coronary angioplasty)   . Rheumatoid arthritis(714.0)     takes Methotrexate 7pills weekly  . Degeneration of intervertebral disc, site unspecified   . Myocardial infarction ([place]) 2010  . Pneumonia 2008; 07/2015  . Type II or unspecified type diabetes mellitus without mention of complication, not stated as uncontrolled   . Skin cancer     "arms"   Past Surgical History  Procedure Laterality Date  . Rotator cuff repair Left 2000  . Colonoscopy    . Vasectomy  1979  . Coronary angioplasty with stent placement  02/2009    has one stent  . Carotid doppler  10/12    0-39% R and 60-79% left   . Cataract extraction  2012  . Cardiac catheterization  2010  . Lithotripsy  2009  . Tee without cardioversion N/A 08/06/2015    Procedure: TRANSESOPHAGEAL ECHOCARDIOGRAM (TEE);  Surgeon: Skeet Latch, MD;  Location: [facility];  Service: Cardiovascular;  Laterality: N/A;  . Cardioversion N/A 08/06/2015    Procedure: CARDIOVERSION;  Surgeon: Jonelle Sidle  Oval Linsey, MD;  Location: [facility];  Service: Cardiovascular;  Laterality: N/A;  . Back surgery    . Lumbar laminectomy  07/2015  . Posterior laminectomy thoracic spine      "fixed the discs"  . Skin cancer excision      "arms"   Family History  Problem Relation Age of Onset  . Coronary artery disease Mother   . Diabetes Mother   . CAD Mother   . Coronary artery disease Sister   . Hypertension Sister   . Kidney failure Sister   . CAD Sister   . Diabetes Brother   . Prostate cancer Brother   . Pancreatic cancer Brother   . Diabetes Brother   . Anesthesia problems Neg Hx   . Hypotension Neg Hx   . Malignant hyperthermia Neg Hx   . Pseudochol deficiency Neg  Hx    Social History  Substance Use Topics  . Smoking status: Former Smoker -- 1.50 packs/day for 53 years    Types: Cigarettes  . Smokeless tobacco: Never Used     Comment: quit smoking cigarettes in 2010  . Alcohol Use: 0.0 oz/week    0 Standard drinks or equivalent per week     Comment: 08/11/2015 "might have 1-2 margaritas/month in the summertime"    Review of Systems  Constitutional: Negative for fever.  Respiratory: Positive for cough.   Cardiovascular: Positive for chest pain.  Gastrointestinal: Negative for nausea, vomiting and diarrhea.  All other systems reviewed and are negative.  Allergies  Ace inhibitors; Angiotensin receptor blockers; Metoprolol; Penicillins; and Tetracycline  Home Medications   Prior to Admission medications   Medication Sig Start Date End Date Taking? Authorizing Provider  acetaminophen (TYLENOL) 325 MG tablet Take 650 mg by mouth every 6 (six) hours as needed (Pain scale of 1-4 or temp > 101).   Yes Historical Provider, MD  amiodarone (PACERONE) 400 MG tablet Take 1 tablet (400 mg total) by mouth daily. 09/02/15  Yes Ivan Anchors Love, PA-C  calcium-vitamin D (OSCAL WITH D) 500-200 MG-UNIT tablet Take 1 tablet by mouth daily with breakfast.   Yes Historical Provider, MD  ferrous sulfate 325 (65 FE) MG tablet Take 1 tablet (325 mg total) by mouth 3 (three) times daily with meals. 09/02/15  Yes Ivan Anchors Love, PA-C  glipiZIDE (GLUCOTROL) 5 MG tablet Take 1 tablet (5 mg total) by mouth daily before breakfast. 09/02/15  Yes Ivan Anchors Love, PA-C  pantoprazole (PROTONIX) 40 MG tablet Take 1 tablet (40 mg total) by mouth daily. 09/02/15  Yes Ivan Anchors Love, PA-C  thiamine 100 MG tablet Take 1 tablet (100 mg total) by mouth daily. 09/02/15  Yes Ivan Anchors Love, PA-C  warfarin (COUMADIN) 5 MG tablet Take 1 tablet (5 mg total) by mouth daily at 6 PM. 09/02/15  Yes Ivan Anchors Love, PA-C  atorvastatin (LIPITOR) 20 MG tablet Take 20 mg by mouth daily at 6 PM. Reported on  09/05/2015    Historical Provider, MD  predniSONE (DELTASONE) 10 MG tablet Take 5-10 mg by mouth daily with breakfast.     Historical Provider, MD  senna (SENOKOT) 8.6 MG TABS tablet Take 2 tablets (17.2 mg total) by mouth daily. 09/02/15   Bary Leriche, PA-C   BP 114/64 mmHg  Pulse 96  Temp(Src) 99.3 F (37.4 C) (Oral)  Resp 15  Ht 5\' 8"  (1.727 m)  Wt 176 lb 1.6 oz (79.878 kg)  BMI 26.78 kg/m2  SpO2 96% Physical Exam  Constitutional: He  is oriented to person, place, and time. He appears well-developed and well-nourished.  HENT:  Head: Normocephalic and atraumatic.  Eyes: EOM are normal. Pupils are equal, round, and reactive to light.  Neck: Normal range of motion. Neck supple.  Cardiovascular: Normal rate.  An irregular rhythm present. Exam reveals no gallop and no friction rub.   Murmur heard.  Systolic murmur is present  Pulmonary/Chest: Effort normal and breath sounds normal. He has no wheezes.  Abdominal: Soft. There is no tenderness.  Musculoskeletal: Normal range of motion.  Neurological: He is alert and oriented to person, place, and time.  Skin: Skin is warm and dry.  Psychiatric: He has a normal mood and affect. His behavior is normal.  Nursing note and vitals reviewed.   ED Course  Procedures (including critical care time) DIAGNOSTIC STUDIES: Oxygen Saturation is 96% on RA, normal by my interpretation.    COORDINATION OF CARE: 11:12 PM-Discussed treatment plan which includes labs and chest x-ray with pt at bedside and pt agreed to plan.    Labs Review Labs Reviewed  BASIC METABOLIC PANEL - Abnormal; Notable for the following:    CO2 21 (*)    Glucose, Bld 216 (*)    BUN 21 (*)    Creatinine, Ser 1.82 (*)    Calcium 8.7 (*)    GFR calc non Af Amer 34 (*)    GFR calc Af Amer 40 (*)    All other components within normal limits  CBC - Abnormal; Notable for the following:    RBC 3.42 (*)    Hemoglobin 11.0 (*)    HCT 33.8 (*)    RDW 16.1 (*)    All other  components within normal limits  I-STAT TROPOININ, ED    Imaging Review Dg Chest 2 View  09/05/2015  CLINICAL DATA:  Acute onset of mid chest pain, radiating to the back. Initial encounter. EXAM: CHEST  2 VIEW COMPARISON:  Chest radiograph from 08/17/2015 FINDINGS: The lungs are well-aerated. Peribronchial thickening is noted, with mild bilateral atelectasis or scarring. There is no evidence of pleural effusion or pneumothorax. The heart is borderline normal in size. No acute osseous abnormalities are seen. Thoracolumbar spinal fusion hardware is noted. Chronic compression deformities are noted at the mid thoracic spine. IMPRESSION: Peribronchial thickening, with mild bilateral atelectasis or scarring. Lungs otherwise grossly clear. Electronically Signed   By: Garald Balding M.D.   On: 09/05/2015 21:19   Ct Angio Chest Aorta W/cm &/or Wo/cm  09/06/2015  CLINICAL DATA:  Chest pain radiating to the back beginning at 1600 hours. Similar symptoms September 04, 2015. History of polymyalgia rheumatica, diabetes, CHF. EXAM: CT ANGIOGRAPHY CHEST WITH AND WITHOUT CONTRAST TECHNIQUE: Multidetector CT imaging of the chest was performed using the standard protocol during bolus administration of intravenous contrast. Multiplanar CT image reconstructions and MIPs were obtained to evaluate the vascular anatomy. CONTRAST:  58mL OMNIPAQUE IOHEXOL 350 MG/ML SOLN COMPARISON:  CT chest July 26, 2015 and chest radiograph September 05, 2015 FINDINGS: MEDIASTINUM: The heart is mildly enlarged, similar to prior examination. Small residual pericardial effusion. Moderate coronary artery calcifications. Noncontrast CT of the thoracic aorta shows no abnormal density. Postcontrast imaging of the aorta shows homogeneous contrast opacification, normal in course and caliber with moderate calcific atherosclerosis and and intimal thickening. No aneurysm, dissection, suspicious luminal irregularity, periaortic fluid collections or contrast  extravasation. High-grade stenosis LEFT V1 segment due to atherosclerosis, patent arch vessels. Scattered prominent though not pathologically enlarged mediastinal lymph nodes measure up  to 9 mm, previously 11 mm short axis. LUNGS: Tracheobronchial tree is patent, no pneumothorax. Complete resolution of pleural effusions. Dependent atelectasis and/or scarring. RIGHT upper lobe atelectasis. No focal consolidation. Interval extubation. SOFT TISSUES AND OSSEOUS STRUCTURES: Thoracolumbar PLIF. Osteopenia stable mild T5, moderate to severe to T7, moderate T8 mild T9 compression fractures. Multiple small gallstones. Colonic diverticulosis partially imaged. 13 mm exophytic RIGHT interpolar cyst. Multiple thyroid nodules measure up to 14 mm, which do not reach size criteria for recommended follow-up. Review of the MIP images confirms the above findings. IMPRESSION: No acute vascular process. Moderate atherosclerosis and high-grade stenosis LEFT vertebral artery origin. Stable cardiomegaly, small residual pericardial effusion. Resolution of pleural effusions. No acute pulmonary process. Electronically Signed   By: Elon Alas M.D.   On: 09/06/2015 02:04   I have personally reviewed and evaluated these images and lab results as part of my medical decision-making.   EKG Interpretation   Date/Time:  Sunday September 05 2015 20:47:14 EDT Ventricular Rate:  105 PR Interval:  140 QRS Duration: 90 QT Interval:  368 QTC Calculation: 486 R Axis:   -52 Text Interpretation:  Sinus tachycardia with occasional ventricular-paced  complexes and Premature supraventricular complexes and with frequent  Premature ventricular complexes Possible Left atrial enlargement Left  anterior fascicular block Abnormal ECG pvcs No significant change since  last tracing Confirmed by Gerald Leitz ([id number]) on 09/05/2015 11:26:08  PM      MDM   Final diagnoses:  None    Patient is a 75 year old male with history of  hypertension, stent placement, A. fib, on Coumadin presenting today with chest pain radiating to his back. Chest pain And lack yesterday evening. It resolved in the morning and then got worse again today at 3 PM. Patient took aspirin with EMS. Patient states he still has the pain. It is eased up some travel to his back more.   Patient has high levels of risk for cardiac disease. We will rule out dissection given that the pain is radiating to his back. Initial EKG is nonischemic. Initial troponin negative.   I personally performed the services described in this documentation, which was scribed in my presence. The recorded information has been reviewed and is accurate.    2:27 AM CT negative for dissection. Will admit to medicine for serial troponins given his high heart score.  Bianka Liberati Julio Alm, MD 09/06/15 0430

## 2015-09-05 NOTE — ED Notes (Signed)
Onset last night pt had chest pain, went to bed and when woke up pain was better, then onset today 3pm pt started having chest pain again.  Mid chest pain radiating through to back.  EMS came to home tonight, pt didn't want to be transported.

## 2015-09-06 ENCOUNTER — Encounter (HOSPITAL_COMMUNITY): Payer: Self-pay | Admitting: Radiology

## 2015-09-06 ENCOUNTER — Telehealth: Payer: Self-pay | Admitting: Physical Medicine & Rehabilitation

## 2015-09-06 ENCOUNTER — Emergency Department (HOSPITAL_COMMUNITY): Payer: Medicare Other

## 2015-09-06 ENCOUNTER — Observation Stay (HOSPITAL_COMMUNITY): Payer: Medicare Other

## 2015-09-06 DIAGNOSIS — N189 Chronic kidney disease, unspecified: Secondary | ICD-10-CM | POA: Diagnosis not present

## 2015-09-06 DIAGNOSIS — N179 Acute kidney failure, unspecified: Secondary | ICD-10-CM

## 2015-09-06 DIAGNOSIS — M069 Rheumatoid arthritis, unspecified: Secondary | ICD-10-CM

## 2015-09-06 DIAGNOSIS — R7989 Other specified abnormal findings of blood chemistry: Secondary | ICD-10-CM

## 2015-09-06 DIAGNOSIS — R079 Chest pain, unspecified: Secondary | ICD-10-CM

## 2015-09-06 DIAGNOSIS — R0789 Other chest pain: Secondary | ICD-10-CM | POA: Diagnosis not present

## 2015-09-06 DIAGNOSIS — N182 Chronic kidney disease, stage 2 (mild): Secondary | ICD-10-CM

## 2015-09-06 DIAGNOSIS — E1022 Type 1 diabetes mellitus with diabetic chronic kidney disease: Secondary | ICD-10-CM | POA: Diagnosis not present

## 2015-09-06 DIAGNOSIS — E119 Type 2 diabetes mellitus without complications: Secondary | ICD-10-CM

## 2015-09-06 DIAGNOSIS — I481 Persistent atrial fibrillation: Secondary | ICD-10-CM

## 2015-09-06 DIAGNOSIS — I5032 Chronic diastolic (congestive) heart failure: Secondary | ICD-10-CM

## 2015-09-06 DIAGNOSIS — I480 Paroxysmal atrial fibrillation: Secondary | ICD-10-CM | POA: Diagnosis not present

## 2015-09-06 DIAGNOSIS — I10 Essential (primary) hypertension: Secondary | ICD-10-CM

## 2015-09-06 DIAGNOSIS — M353 Polymyalgia rheumatica: Secondary | ICD-10-CM | POA: Diagnosis not present

## 2015-09-06 LAB — LIPID PANEL
CHOLESTEROL: 155 mg/dL (ref 0–200)
HDL: 43 mg/dL (ref >40)
LDL Cholesterol: 100 mg/dL — ABNORMAL HIGH (ref 0–99)
Total CHOL/HDL Ratio: 3.6 RATIO
Triglycerides: 61 mg/dL (ref <150)
VLDL: 12 mg/dL (ref 0–40)

## 2015-09-06 LAB — CORTISOL-AM, BLOOD: Cortisol - AM: 17.1 ug/dL (ref 6.7–22.6)

## 2015-09-06 LAB — GLUCOSE, CAPILLARY
GLUCOSE-CAPILLARY: 119 mg/dL — AB (ref 65–99)
GLUCOSE-CAPILLARY: 228 mg/dL — AB (ref 65–99)
Glucose-Capillary: 89 mg/dL (ref 65–99)

## 2015-09-06 LAB — PROTIME-INR
INR: 1.52 — ABNORMAL HIGH (ref 0.00–1.49)
PROTHROMBIN TIME: 18.4 s — AB (ref 11.6–15.2)

## 2015-09-06 LAB — SODIUM, URINE, RANDOM: SODIUM UR: 35 mmol/L

## 2015-09-06 LAB — TROPONIN I
TROPONIN I: 0.04 ng/mL — AB (ref <0.031)
Troponin I: 0.03 ng/mL (ref <0.031)

## 2015-09-06 LAB — ECHOCARDIOGRAM COMPLETE
HEIGHTINCHES: 68 in
WEIGHTICAEL: 2760 [oz_av]

## 2015-09-06 LAB — BRAIN NATRIURETIC PEPTIDE: B NATRIURETIC PEPTIDE 5: 339.2 pg/mL — AB (ref 0.0–100.0)

## 2015-09-06 LAB — APTT: APTT: 35 s (ref 24–37)

## 2015-09-06 MED ORDER — VITAMIN B-1 100 MG PO TABS
100.0000 mg | ORAL_TABLET | Freq: Every day | ORAL | Status: DC
  Administered 2015-09-06: 100 mg via ORAL
  Filled 2015-09-06: qty 1

## 2015-09-06 MED ORDER — ZOLPIDEM TARTRATE 5 MG PO TABS: 5.0000 mg | ORAL_TABLET | Freq: Every evening | ORAL | Status: DC | PRN

## 2015-09-06 MED ORDER — SODIUM CHLORIDE 0.9 % IV SOLN
INTRAVENOUS | Status: AC
  Administered 2015-09-06: 04:00:00 via INTRAVENOUS

## 2015-09-06 MED ORDER — MORPHINE SULFATE (PF) 2 MG/ML IV SOLN
2.0000 mg | INTRAVENOUS | Status: DC | PRN
Start: 2015-09-06 — End: 2015-09-06

## 2015-09-06 MED ORDER — AMIODARONE HCL 400 MG PO TABS: 200.0000 mg | ORAL_TABLET | Freq: Every day | ORAL | Status: DC

## 2015-09-06 MED ORDER — GI COCKTAIL ~~LOC~~
30.0000 mL | Freq: Once | ORAL | Status: AC
  Administered 2015-09-06: 30 mL via ORAL
  Filled 2015-09-06: qty 30

## 2015-09-06 MED ORDER — FERROUS SULFATE 325 (65 FE) MG PO TABS
325.0000 mg | ORAL_TABLET | Freq: Three times a day (TID) | ORAL | Status: DC
  Administered 2015-09-06 (×2): 325 mg via ORAL
  Filled 2015-09-06 (×2): qty 1

## 2015-09-06 MED ORDER — AMIODARONE HCL 200 MG PO TABS
200.0000 mg | ORAL_TABLET | Freq: Every day | ORAL | Status: DC
  Administered 2015-09-06: 200 mg via ORAL
  Filled 2015-09-06: qty 1

## 2015-09-06 MED ORDER — ENOXAPARIN SODIUM 80 MG/0.8ML ~~LOC~~ SOLN
1.0000 mg/kg | Freq: Two times a day (BID) | SUBCUTANEOUS | Status: DC
  Administered 2015-09-06: 80 mg via SUBCUTANEOUS
  Filled 2015-09-06: qty 0.8

## 2015-09-06 MED ORDER — NITROGLYCERIN 0.4 MG SL SUBL: 0.4000 mg | SUBLINGUAL_TABLET | SUBLINGUAL | Status: DC | PRN

## 2015-09-06 MED ORDER — SENNA 8.6 MG PO TABS
2.0000 | ORAL_TABLET | Freq: Every day | ORAL | Status: DC
  Administered 2015-09-06: 17.2 mg via ORAL
  Filled 2015-09-06: qty 2

## 2015-09-06 MED ORDER — ASPIRIN 81 MG PO CHEW: 81.0000 mg | CHEWABLE_TABLET | ORAL | Status: DC

## 2015-09-06 MED ORDER — INSULIN ASPART 100 UNIT/ML ~~LOC~~ SOLN: 0.0000 [IU] | Freq: Three times a day (TID) | SUBCUTANEOUS | Status: DC

## 2015-09-06 MED ORDER — ONDANSETRON HCL 4 MG/2ML IJ SOLN: 4.0000 mg | Freq: Four times a day (QID) | INTRAMUSCULAR | Status: DC | PRN

## 2015-09-06 MED ORDER — CALCIUM CARBONATE-VITAMIN D 500-200 MG-UNIT PO TABS
1.0000 | ORAL_TABLET | Freq: Every day | ORAL | Status: DC
  Administered 2015-09-06: 1 via ORAL
  Filled 2015-09-06: qty 1

## 2015-09-06 MED ORDER — WARFARIN SODIUM 5 MG PO TABS: 10.0000 mg | ORAL_TABLET | Freq: Every day | ORAL | Status: DC

## 2015-09-06 MED ORDER — PANTOPRAZOLE SODIUM 40 MG PO TBEC
40.0000 mg | DELAYED_RELEASE_TABLET | Freq: Every day | ORAL | Status: DC
  Administered 2015-09-06: 40 mg via ORAL
  Filled 2015-09-06: qty 1

## 2015-09-06 MED ORDER — INSULIN ASPART 100 UNIT/ML ~~LOC~~ SOLN: 0.0000 [IU] | Freq: Every day | SUBCUTANEOUS | Status: DC

## 2015-09-06 MED ORDER — IOHEXOL 350 MG/ML SOLN
100.0000 mL | Freq: Once | INTRAVENOUS | Status: AC | PRN
  Administered 2015-09-06: 60 mL via INTRAVENOUS

## 2015-09-06 MED ORDER — PREDNISONE 5 MG PO TABS: 5.0000 mg | ORAL_TABLET | Freq: Every day | ORAL | Status: DC

## 2015-09-06 MED ORDER — WARFARIN - PHARMACIST DOSING INPATIENT: Freq: Every day | Status: DC

## 2015-09-06 MED ORDER — HEPARIN (PORCINE) IN NACL 100-0.45 UNIT/ML-% IJ SOLN
1100.0000 [IU]/h | INTRAMUSCULAR | Status: DC
  Administered 2015-09-06: 1100 [IU]/h via INTRAVENOUS
  Filled 2015-09-06: qty 250

## 2015-09-06 MED ORDER — HYDROCORTISONE NA SUCCINATE PF 100 MG IJ SOLR
50.0000 mg | Freq: Once | INTRAMUSCULAR | Status: AC
  Administered 2015-09-06: 50 mg via INTRAVENOUS
  Filled 2015-09-06: qty 2

## 2015-09-06 MED ORDER — ACETAMINOPHEN 325 MG PO TABS: 650.0000 mg | ORAL_TABLET | Freq: Four times a day (QID) | ORAL | Status: DC | PRN

## 2015-09-06 MED ORDER — WARFARIN SODIUM 7.5 MG PO TABS: 7.5000 mg | ORAL_TABLET | Freq: Once | ORAL | Status: DC

## 2015-09-06 MED ORDER — AMIODARONE HCL 200 MG PO TABS: 400.0000 mg | ORAL_TABLET | Freq: Every day | ORAL | Status: DC

## 2015-09-06 MED ORDER — ATORVASTATIN CALCIUM 20 MG PO TABS
20.0000 mg | ORAL_TABLET | Freq: Every day | ORAL | Status: DC
  Administered 2015-09-06: 20 mg via ORAL
  Filled 2015-09-06: qty 1

## 2015-09-06 MED ORDER — ASPIRIN 300 MG RE SUPP: 300.0000 mg | RECTAL | Status: DC

## 2015-09-06 NOTE — Evaluation (Signed)
Occupational Therapy Evaluation Patient Details Name: Curtis Frazier MRN: [id number] DOB: 01-Jan-1941 Today's Date: 09/06/2015    History of Present Illness Patient is a 75 y/o male with hx of lumbar surgery 2/2, HIV, psoriatc arthritis, scoliosis, HLD, HTN, MI, RA, and DM who was just discharged from [facility] on 3/23 presents with chest pain.    Clinical Impression   Pt was sponge bathing, dressing and toileting at a modified independent level and ambulating with a walker prior to this admission. Pt presents with generalized weakness, tremor, decreased activity tolerance and some memory impairment interfering with ability to perform at his baseline. Recommending return home with [facility]. Pt is in agreement. Anticipates return home today.    Follow Up Recommendations  Home health OT;Supervision/Assistance - 24 hour    Equipment Recommendations  None recommended by OT    Recommendations for Other Services       Precautions / Restrictions Precautions Precautions: Back;Fall Precaution Comments: Recent spine surgery 07/15/15, swallowing prec (no straws) Restrictions Weight Bearing Restrictions: No      Mobility Bed Mobility Overal bed mobility: Needs Assistance Bed Mobility: Rolling;Sidelying to Sit;Sit to Sidelying Rolling: Min guard Sidelying to sit: Min guard     Sit to sidelying: Min guard General bed mobility comments: Use of rail for support. No assist needed to get to EOB.   Transfers Overall transfer level: Needs assistance Equipment used: Rolling walker (2 wheeled) Transfers: Sit to/from Stand Sit to Stand: Min guard         General transfer comment: Min guard for safety. Cues for technique, hand placement.     Balance Overall balance assessment: Needs assistance Sitting-balance support: Feet supported;No upper extremity supported Sitting balance-Leahy Scale: Good     Standing balance support: During functional activity;Bilateral upper extremity supported Standing  balance-Leahy Scale: Poor Standing balance comment: leans on sink to wash hands when releasing walker                            ADL Overall ADL's : Needs assistance/impaired Eating/Feeding: Sitting;Independent   Grooming: Wash/dry hands;Standing;Supervision/safety   Upper Body Bathing: Set up;Sitting   Lower Body Bathing: Supervison/ safety;Sit to/from stand   Upper Body Dressing : Set up;Sitting   Lower Body Dressing: Supervision/safety;Sit to/from stand   Toilet Transfer: Minimal assistance;Ambulation;Comfort height toilet;RW   Toileting- Clothing Manipulation and Hygiene: Minimal assistance;Sit to/from stand       Functional mobility during ADLs: Minimal assistance;Rolling walker       Vision     Perception     Praxis      Pertinent Vitals/Pain Pain Assessment: Faces Faces Pain Scale: Hurts a little bit Pain Location: R hip Pain Descriptors / Indicators: Aching Pain Intervention(s): Limited activity within patient's tolerance;Monitored during session;Repositioned     Hand Dominance Right   Extremity/Trunk Assessment Upper Extremity Assessment Upper Extremity Assessment: Overall WFL for tasks assessed (tremulous)   Lower Extremity Assessment Lower Extremity Assessment: Generalized weakness   Cervical / Trunk Assessment Cervical / Trunk Assessment: Kyphotic   Communication Communication Communication: No difficulties   Cognition Arousal/Alertness: Awake/alert Behavior During Therapy: WFL for tasks assessed/performed Overall Cognitive Status: Impaired/Different from baseline       Memory: Decreased recall of precautions;Decreased short-term memory             General Comments       Exercises       Shoulder Instructions      Home Living Family/patient expects to  be discharged to:: Private residence Living Arrangements: Spouse/significant other Available Help at Discharge: Family;Available 24 hours/day Type of Home:  House Home Access: Stairs to enter [employer] of Steps: 1 from garage Entrance Stairs-Rails: None Home Layout: One level     Bathroom Shower/Tub: Walk-in shower;Door   [employer] Toilet: Handicapped height Bathroom Accessibility: Yes How Accessible: Accessible via walker Home Equipment: [place] - 2 wheels;Bedside commode;Shower seat;Hand held shower head      Lives With: Spouse    Prior Functioning/Environment Level of Independence: Independent with assistive device(s) (was sponge bathing)        Comments: Likes country music. Using RW since d/c from [facility] 3/23. Was supposed to start HHPT today, 3/27.    OT Diagnosis: Generalized weakness;Acute pain;Cognitive deficits   OT Problem List: Decreased strength;Decreased activity tolerance;Impaired balance (sitting and/or standing);Decreased knowledge of use of DME or AE;Pain;Decreased knowledge of precautions;Decreased safety awareness;Decreased cognition   OT Treatment/Interventions:      OT Goals(Current goals can be found in the care plan section) Acute Rehab OT Goals Patient Stated Goal: to return to independence  OT Frequency:     Barriers to D/C:            Co-evaluation              End of Session Equipment Utilized During Treatment: Gait belt;Rolling walker  Activity Tolerance: Patient limited by fatigue Patient left: in bed;with call bell/phone within reach;with bed alarm set;with family/visitor present   Time: [id number] OT Time Calculation (min): 22 min Charges:  OT General Charges $OT Visit: 1 Procedure OT Evaluation $OT Eval Moderate Complexity: 1 Procedure G-Codes: OT G-codes **NOT FOR INPATIENT CLASS** Functional Assessment Tool Used: clinical judgement Functional Limitation: Self care Self Care Current Status [id number]): At least 20 percent but less than 40 percent impaired, limited or restricted Self Care Goal Status [id number]): At least 20 percent but less than 40 percent impaired, limited  or restricted Self Care Discharge Status [phone]): At least 20 percent but less than 40 percent impaired, limited or restricted  Malka So 09/06/2015, 3:05 PM [phone]

## 2015-09-06 NOTE — Discharge Summary (Signed)
Physician Discharge Summary  Curtis Frazier [id number] DOB: 1941-01-28 DOA: 09/05/2015  PCP: Loura Pardon, MD  Admit date: 09/05/2015 Discharge date: 09/06/2015  Time spent: 45 minutes  Recommendations for Outpatient Follow-up:  1. 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 male with PMH of hypertension, hyperlipidemia, diabetes mellitus, GERD, CAD, S/P of stent placement, psoriasis, PMR, RA on chronic steroid, skin cancer, diastolic congestive heart failure, chronic kidney disease-stage II, atrial fibrillation on Coumadin, BPH, who presented with chest pain. Patient reports that he had one episode of chest pain in Saturday night, which resolved spontaneously. He started having chest pain again at about 3 PM. His chest pain has been persistent since started. It is located in the substernal area, 6 out of 10 in severity, sharp, radiating to his back.   Hospital Course:  75/M with multiple med problems, DM, HTN, Dyslipidemia, Cad s/p BMS to Circumflex in 2010 at [facility], CKD 3, PMR on prednisone admitted with atypical chest pain, on Saturday and Sunday, now chest pain free. EKG without acute ST T wave changes, CTA negative for PE or dissection Myoview  11/16: negative for ischemia, ruled out for ACS by negative enzymes, Cardiology Dr.Skains consulted, no further workup recommended at this time, he is advised to FU with Dr.Nishan and if he has recurrence of symptoms LHC could be considered, not advisable at this time especially with CKD3. -P,Afib: INR subtherapeutic, given additional lovenox today, advised higher dose of warfarin for 2days and then repeat INR in 3days -Rest if his medical problems were stable  Consultations: Cardiology Dr.Skains  Discharge Exam: Filed Vitals:   09/06/15 0430 09/06/15 1340  BP: 116/51 111/47  Pulse: 97 90  Temp: 98.9 F (37.2 C) 98.5 F (36.9 C)  Resp: 18 19    General:AAOx3 Cardiovascular:S1S2/RRR Respiratory: CTAB  Discharge Instructions   Discharge Instructions    Diet - low sodium heart healthy    Complete by:  As directed      Increase activity slowly    Complete by:  As directed           Current Discharge Medication List    CONTINUE these medications which have CHANGED   Details  amiodarone (PACERONE) 400 MG tablet Take 0.5 tablets (200 mg total) by mouth daily. Qty: 1 tablet, Refills: 1    warfarin (COUMADIN) 5 MG tablet Take 2 tablets (10 mg total) by mouth daily at 6 PM. Take 10mg  for 2days then resume 5mg  daily, please have INR check in 3-4days Refills: 0      CONTINUE these medications which have NOT CHANGED   Details  acetaminophen (TYLENOL) 325 MG tablet Take 650 mg by mouth every 6 (six) hours as needed (Pain  scale of 1-4 or temp > 101).    calcium-vitamin D (OSCAL WITH D) 500-200 MG-UNIT tablet Take 1 tablet by mouth daily with breakfast.    ferrous sulfate 325 (65 FE) MG tablet Take 1 tablet (325 mg total) by mouth 3 (three) times daily with meals. Qty: 90 tablet, Refills: 0    glipiZIDE (GLUCOTROL) 5 MG tablet Take 1 tablet (5 mg total) by mouth daily before breakfast. Qty: 30 tablet, Refills: 0    pantoprazole (PROTONIX) 40 MG tablet Take 1 tablet (40 mg  total) by mouth daily. Qty: 30 tablet, Refills: 0    thiamine 100 MG tablet Take 1 tablet (100 mg total) by mouth daily. Qty: 30 tablet, Refills: 0    atorvastatin (LIPITOR) 20 MG tablet Take 20 mg by mouth daily at 6 PM. Reported on 09/05/2015    predniSONE (DELTASONE) 10 MG tablet Take 5-10 mg by mouth daily with breakfast.     senna (SENOKOT) 8.6 MG TABS tablet Take 2 tablets (17.2 mg total) by mouth daily. Qty: 120 each, Refills: 0       Allergies  Allergen Reactions  . Ace Inhibitors Other (See Comments)    Causes high K  . Angiotensin Receptor Blockers Other (See Comments)    Causes high K  . Metoprolol Other (See Comments)    May cause elevated K level  . Penicillins Swelling and Rash  . Tetracycline Swelling and Rash   Follow-up Information    Follow up with Jenkins Rouge, MD. Schedule an appointment as soon as possible for a visit in 2 weeks.   Specialty:  Cardiology   Contact information:   [id number] [place] [address] [phone]        The results of significant diagnostics from this hospitalization (including imaging, microbiology, ancillary and laboratory) are listed below for reference.    Significant Diagnostic Studies: Dg Chest 2 View  09/05/2015  CLINICAL DATA:  Acute onset of mid chest pain, radiating to the back. Initial encounter. EXAM: CHEST  2 VIEW COMPARISON:  Chest radiograph from 08/17/2015 FINDINGS: The lungs are well-aerated. Peribronchial thickening is noted, with mild bilateral atelectasis or scarring. There is no evidence of pleural effusion or pneumothorax. The heart is borderline normal in size. No acute osseous abnormalities are seen. Thoracolumbar spinal fusion hardware is noted. Chronic compression deformities are noted at the mid thoracic spine. IMPRESSION: Peribronchial thickening, with mild bilateral atelectasis or scarring. Lungs otherwise grossly clear. Electronically Signed   By: Garald Balding M.D.   On:  09/05/2015 21:19   Dg Chest 2 View  08/17/2015  CLINICAL DATA:  Cough.  Recent back surgery. EXAM: CHEST  2 VIEW COMPARISON:  08/12/2015 FINDINGS: The heart size and mediastinal contours are within normal limits. Mild subsegmental atelectasis in the left lower lung shows no significant change. No evidence of pneumothorax or pleural effusion. No evidence of pulmonary consolidation or edema. Right arm PICC line is seen in appropriate position. Posterior thoracolumbar spine fusion hardware again noted. IMPRESSION: Mild left lower lobe atelectasis, without significant change. No evidence of pulmonary consolidation or pleural effusion. Electronically Signed   By: Earle Gell M.D.   On: 08/17/2015 18:19   Mr Brain Wo Contrast  08/12/2015  CLINICAL DATA:  Acute encephalopathy. Possibly from medication. Altered mental status. EXAM: MRI HEAD WITHOUT CONTRAST TECHNIQUE: Multiplanar, multiecho pulse sequences of the brain and surrounding structures were obtained without intravenous contrast. COMPARISON:  CT head 07/26/2015. FINDINGS: No evidence for acute infarction,  hemorrhage, mass lesion, hydrocephalus, or extra-axial fluid. Generalized atrophy. Moderate T2 and FLAIR hyperintensity throughout the white matter, likely chronic microvascular ischemic change. Flow voids are maintained throughout the carotid, basilar, and vertebral arteries. There are no areas of chronic hemorrhage. Pituitary, pineal, and cerebellar tonsils unremarkable. No upper cervical lesions. BILATERAL mastoid effusions without nasopharyngeal lesion, likely secondary to recumbency, although given the persistence since previous CT, BILATERAL mastoiditis not excluded. 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 BILATERAL cataract extraction. IMPRESSION: No acute intracranial findings.  Atrophy and small vessel disease. BILATERAL mastoid fluid without nasopharyngeal lesion. Likely effusions. Correlate clinically for mastoiditis. Chronic and  possibly acute RIGHT maxillary sinus disease. Electronically Signed   By: Staci Righter M.D.   On: 08/12/2015 14:00   Dg Chest Port 1 View  08/12/2015  CLINICAL DATA:  Dyspnea, hypertension, pneumonia 2/17 EXAM: PORTABLE CHEST 1 VIEW COMPARISON:  08/10/2015 FINDINGS: Cardiomediastinal silhouette is stable. No infiltrate or pulmonary edema. Lower thoracic dextroscoliosis again noted. Again noted metallic fixation rods lower thoracic spine and lumbar spine. Right arm PICC line is unchanged in position. No infiltrate or pulmonary edema. Persistent linear atelectasis or scarring in lingula and left base. IMPRESSION: No infiltrate or pulmonary edema. Persistent linear atelectasis or scarring in left base. Electronically Signed   By: Lahoma Crocker M.D.   On: 08/12/2015 12:49   Portable Chest 1 View  08/10/2015  CLINICAL DATA:  Shortness of breath EXAM: PORTABLE CHEST 1 VIEW COMPARISON:  08/05/2015 FINDINGS: Low lung volumes with bibasilar atelectasis, worse on the left. Stable cardiomegaly without edema or effusion. No pneumothorax. Right PICC line tip mid SVC level. Residual scoliosis of the spine. Thoracolumbar fusion hardware partially imaged. Trachea is midline. IMPRESSION: Cardiomegaly with basilar atelectasis, worse on the left. Electronically Signed   By: Jerilynn Mages.  Shick M.D.   On: 08/10/2015 18:35   Dg Swallowing Func-speech Pathology  08/25/2015  Objective Swallowing Evaluation: Type of Study: MBS-Modified Barium Swallow Study Patient Details Name: STINSON RASER MRN: [id number] Date of Birth: 09/22/1940 Today's Date: 08/25/2015 Time: SLP Start Time (ACUTE ONLY): 0940-SLP Stop Time (ACUTE ONLY): 1015 SLP Time Calculation (min) (ACUTE ONLY): 35 min Past Medical History: Past Medical History Diagnosis Date . Other and unspecified hyperlipidemia  . Coronary atherosclerosis of unspecified type of vessel, native or graft  . Personal history of colonic polyps  . Actinic keratosis  . Psoriatic arthropathy ([place])  . Other  psoriasis  . Scoliosis (and kyphoscoliosis), idiopathic  . GI bleed 12/10   cecam AVM . Gastritis 12/10 . Hyperlipidemia    takes Pravastatin daily . Hypertension    takes Metoprolol daily . Shortness of breath    with exertion . Headache(784.0)    occasionally . Joint pain  . Joint swelling  . Chronic back pain    scoliosis/stenosis/radiculopathy,degenerative disc disease . Psoriasis  . Bruises easily  . Esophageal reflux    takes Omeprazole daily . History of colonic polyps  . Hemorrhoids  . Kidney stone    hx of . Blood transfusion 2010 . PMR (polymyalgia rheumatica) (HCC) 01/20/2012   tx by specialist on a long prednisone taper . S/P PTCA (percutaneous transluminal coronary angioplasty)  . Rheumatoid arthritis(714.0)    takes Methotrexate 7pills weekly . Degeneration of intervertebral disc, site unspecified  . Myocardial infarction ([place]) 2010 . Pneumonia 2008; 07/2015 . Type II or unspecified type diabetes mellitus without mention of complication, not stated as uncontrolled  . Skin cancer    "arms" Past Surgical History: Past Surgical History  Procedure Laterality Date . Rotator cuff repair Left 2000 . Colonoscopy   . Vasectomy  1979 . Coronary angioplasty with stent placement  02/2009   has one stent . Carotid doppler  10/12   0-39% R and 60-79% left  . Cataract extraction  2012 . Cardiac catheterization  2010 . Lithotripsy  2009 . Tee without cardioversion N/A 08/06/2015   Procedure: TRANSESOPHAGEAL ECHOCARDIOGRAM (TEE);  Surgeon: Skeet Latch, MD;  Location: [place];  Service: Cardiovascular;  Laterality: N/A; . Cardioversion N/A 08/06/2015   Procedure: CARDIOVERSION;  Surgeon: Skeet Latch, MD;  Location: [facility] ENDOSCOPY;  Service: Cardiovascular;  Laterality: N/A; . Back surgery   . Lumbar laminectomy  07/2015 . Posterior laminectomy thoracic spine     "fixed the discs" . Skin cancer excision     "arms" HPI: 75 year old male with history of hypertension, GERD, A. fib, sepsis related to pneumonia with  related shock, respiratory failure requiring intubation, A. fib with RVR admitted from [facility] with AMS suspicious for infectious vs medication induced etiologies. FEES 3/2 with moderate oropharyngeal dysphagia with recommendations for dysphagia 1 textures with honey thick liquids via tsp. Patient admitted to [facility] 3/7 and has been participating in dysphagia treatment. MBS today to assess for possible diet upgrade.  Subjective: pt is eager for a cup of coffee Assessment / Plan / Recommendation CHL IP CLINICAL IMPRESSIONS 08/25/2015 Therapy Diagnosis Mild oral phase dysphagia;Mild pharyngeal phase dysphagia Clinical Impression Patient demonstrates a mild oropharyngeal dysphagia characterized by delayed oral transit with piecemeal swallowing with delayed swallow initiation resulting in impaired timing of swallow and intermittent silent penetration of nectar-thick and thin liquids via cup. A cued cough was effective in expelling penetrates from the laryngeal vestibule and a chin tuck was effective in eliminating penetration episodes. Recommend patient initiate a diet of regular textures with thin liquids with full supervision for utilization of swallowing compensatory strategies.  Patient and daughter verbalize understanding of all information.  Impact on safety and function Mild aspiration risk   CHL IP TREATMENT RECOMMENDATION 08/25/2015 Treatment Recommendations Therapy as outlined in treatment plan below   Prognosis 08/25/2015 Prognosis for Safe Diet Advancement Good Barriers to Reach Goals -- Barriers/Prognosis Comment -- CHL IP DIET RECOMMENDATION 08/25/2015 SLP Diet Recommendations Regular solids;Thin liquid Liquid Administration via Cup;No straw Medication Administration Crushed with puree Compensations Minimize environmental distractions;Slow rate;Small sips/bites;Multiple dry swallows after each bite/sip;Hard cough after swallow;Chin tuck Postural Changes Seated upright at 90 degrees   CHL IP  OTHER RECOMMENDATIONS 08/25/2015 Recommended Consults -- Oral Care Recommendations Oral care BID Other Recommendations --   CHL IP FOLLOW UP RECOMMENDATIONS 08/25/2015 Follow up Recommendations Outpatient SLP;24 hour supervision/assistance   CHL IP FREQUENCY AND DURATION 08/25/2015 Speech Therapy Frequency (ACUTE ONLY) min 5x/week Treatment Duration 2 weeks      CHL IP ORAL PHASE 08/25/2015 Oral Phase Impaired Oral - Pudding Teaspoon -- Oral - Pudding Cup -- Oral - Honey Teaspoon Piecemeal swallowing;Delayed oral transit Oral - Honey Cup Piecemeal swallowing;Delayed oral transit Oral - Nectar Teaspoon Piecemeal swallowing;Delayed oral transit Oral - Nectar Cup Piecemeal swallowing;Delayed oral transit Oral - Nectar Straw -- Oral - Thin Teaspoon Piecemeal swallowing;Delayed oral transit Oral - Thin Cup Delayed oral transit;Piecemeal swallowing Oral - Thin Straw Delayed oral transit;Piecemeal swallowing Oral - Puree Delayed oral transit;Piecemeal swallowing Oral - Mech Soft Delayed oral transit;Piecemeal swallowing Oral - Regular -- Oral - Multi-Consistency -- Oral - Pill -- Oral Phase - Comment --  CHL IP PHARYNGEAL PHASE 08/25/2015 Pharyngeal Phase Impaired  Pharyngeal- Pudding Teaspoon -- Pharyngeal -- Pharyngeal- Pudding Cup -- Pharyngeal -- Pharyngeal- Honey Teaspoon Delayed swallow initiation-vallecula;Reduced tongue base retraction Pharyngeal Material does not enter airway Pharyngeal- Honey Cup Delayed swallow initiation-vallecula;Pharyngeal residue - valleculae Pharyngeal Material does not enter airway Pharyngeal- Nectar Teaspoon Delayed swallow initiation-vallecula;Pharyngeal residue - valleculae Pharyngeal Material does not enter airway Pharyngeal- Nectar Cup Compensatory strategies attempted (with notebox);Penetration/Aspiration during swallow;Delayed swallow initiation-vallecula;Pharyngeal residue - valleculae Pharyngeal Material enters airway, remains ABOVE vocal cords and not ejected out Pharyngeal- Nectar  Straw -- Pharyngeal -- Pharyngeal- Thin Teaspoon Delayed swallow initiation-vallecula;Pharyngeal residue - valleculae Pharyngeal -- Pharyngeal- Thin Cup Delayed swallow initiation-vallecula;Penetration/Aspiration during swallow;Compensatory strategies attempted (with notebox);Pharyngeal residue - valleculae Pharyngeal Material enters airway, CONTACTS cords and not ejected out Pharyngeal- Thin Straw Delayed swallow initiation-vallecula;Penetration/Aspiration during swallow Pharyngeal Material enters airway, CONTACTS cords and not ejected out Pharyngeal- Puree Delayed swallow initiation-vallecula;Pharyngeal residue - valleculae Pharyngeal Material does not enter airway Pharyngeal- Mechanical Soft Delayed swallow initiation-vallecula;Pharyngeal residue - valleculae Pharyngeal -- Pharyngeal- Regular -- Pharyngeal -- Pharyngeal- Multi-consistency -- Pharyngeal -- Pharyngeal- Pill -- Pharyngeal -- Pharyngeal Comment --  CHL IP CERVICAL ESOPHAGEAL PHASE 08/25/2015 Cervical Esophageal Phase WFL Pudding Teaspoon -- Pudding Cup -- Honey Teaspoon -- Honey Cup -- Nectar Teaspoon -- Nectar Cup -- Nectar Straw -- Thin Teaspoon -- Thin Cup -- Thin Straw -- Puree -- Mechanical Soft -- Regular -- Multi-consistency -- Pill -- Cervical Esophageal Comment -- CHL IP GO 08/16/2015 Functional Assessment Tool Used ASHA NOMS and clinical judgment.   Functional Limitations Swallowing Swallow Current Status [phone]) (None) Swallow Goal Status [id number]) (None) Swallow Discharge Status [phone]) (None) Motor Speech Current Status [phone]) (None) Motor Speech Goal Status [phone]) (None) Motor Speech Goal Status [phone]) (None) Spoken Language Comprehension Current Status [phone]) (None) Spoken Language Comprehension Goal Status [id number]) (None) Spoken Language Comprehension Discharge Status [phone]) (None) Spoken Language Expression Current Status [phone]) (None) Spoken Language Expression Goal Status [phone]) (None) Spoken Language Expression Discharge Status  [phone]) (None) Attention Current Status [id number]) (None) Attention Goal Status [id number]) (None) Attention Discharge Status [phone]) (None) Memory Current Status [id number]) (None) Memory Goal Status [id number]) (None) Memory Discharge Status [id number]) (None) Voice Current Status [id number]) (None) Voice Goal Status [id number]) (None) Voice Discharge Status [phone]) (None) Other Speech-Language Pathology Functional Limitation [phone]) (None) Other Speech-Language Pathology Functional Limitation Goal Status [id number]) (None) Other Speech-Language Pathology Functional Limitation Discharge Status [phone]) (None) PAYNE, COURTNEY 08/25/2015, 4:32 PM              Ct Angio Chest Aorta W/cm &/or Wo/cm  09/06/2015  CLINICAL DATA:  Chest pain radiating to the back beginning at 1600 hours. Similar symptoms September 04, 2015. History of polymyalgia rheumatica, diabetes, CHF. EXAM: CT ANGIOGRAPHY CHEST WITH AND WITHOUT CONTRAST TECHNIQUE: Multidetector CT imaging of the chest was performed using the standard protocol during bolus administration of intravenous contrast. Multiplanar CT image reconstructions and MIPs were obtained to evaluate the vascular anatomy. CONTRAST:  69mL OMNIPAQUE IOHEXOL 350 MG/ML SOLN COMPARISON:  CT chest July 26, 2015 and chest radiograph September 05, 2015 FINDINGS: MEDIASTINUM: The heart is mildly enlarged, similar to prior examination. Small residual pericardial effusion. Moderate coronary artery calcifications. Noncontrast CT of the thoracic aorta shows no abnormal density. Postcontrast imaging of the aorta shows homogeneous contrast opacification, normal in course and caliber with moderate calcific atherosclerosis and and intimal thickening. No aneurysm, dissection, suspicious luminal irregularity, periaortic fluid collections or contrast extravasation. High-grade stenosis LEFT V1 segment due to atherosclerosis, patent arch vessels. Scattered prominent though not pathologically  enlarged mediastinal lymph nodes measure up to 9  mm, previously 11 mm short axis. LUNGS: Tracheobronchial tree is patent, no pneumothorax. Complete resolution of pleural effusions. Dependent atelectasis and/or scarring. RIGHT upper lobe atelectasis. No focal consolidation. Interval extubation. SOFT TISSUES AND OSSEOUS STRUCTURES: Thoracolumbar PLIF. Osteopenia stable mild T5, moderate to severe to T7, moderate T8 mild T9 compression fractures. Multiple small gallstones. Colonic diverticulosis partially imaged. 13 mm exophytic RIGHT interpolar cyst. Multiple thyroid nodules measure up to 14 mm, which do not reach size criteria for recommended follow-up. Review of the MIP images confirms the above findings. IMPRESSION: No acute vascular process. Moderate atherosclerosis and high-grade stenosis LEFT vertebral artery origin. Stable cardiomegaly, small residual pericardial effusion. Resolution of pleural effusions. No acute pulmonary process. Electronically Signed   By: Elon Alas M.D.   On: 09/06/2015 02:04   Dg Hip Unilat With Pelvis 2-3 Views Right  09/01/2015  CLINICAL DATA:  No known injury, right hip pain EXAM: DG HIP (WITH OR WITHOUT PELVIS) 2-3V RIGHT COMPARISON:  None. FINDINGS: Mild narrowing of both hips was mild right osteophyte formation. No fracture or dislocation. Pelvic bones intact. Lumbar spinal rods partially visualized. Calcification of the aortoiliac and femoral arteries bilaterally. IMPRESSION: No acute findings Electronically Signed   By: Skipper Cliche M.D.   On: 09/01/2015 11:45    Microbiology: No results found for this or any previous visit (from the past 240 hour(s)).   Labs: Basic Metabolic Panel:  Recent Labs Lab 08/31/15 0610 09/01/15 0637 09/05/15 2136  NA 138 140 137  K 4.7 5.0 4.7  CL 106 108 107  CO2 24 24 21*  GLUCOSE 119* 123* 216*  BUN 20 20 21*  CREATININE 1.37* 1.38* 1.82*  CALCIUM 8.9 8.9 8.7*   Liver Function Tests: No results for input(s): AST, ALT, ALKPHOS, BILITOT, PROT, ALBUMIN in the last  168 hours. No results for input(s): LIPASE, AMYLASE in the last 168 hours. No results for input(s): AMMONIA in the last 168 hours. CBC:  Recent Labs Lab 09/05/15 2136  WBC 9.8  HGB 11.0*  HCT 33.8*  MCV 98.8  PLT 256   Cardiac Enzymes:  Recent Labs Lab 09/06/15 0343 09/06/15 0940 09/06/15 1525  TROPONINI 0.04* 0.03 <0.03   BNP: BNP (last 3 results)  Recent Labs  08/09/15 0540 08/12/15 1040 09/06/15 0343  BNP 357.5* 222.8* 339.2*    ProBNP (last 3 results) No results for input(s): PROBNP in the last 8760 hours.  CBG:  Recent Labs Lab 09/02/15 0643 09/02/15 1146 09/06/15 0534 09/06/15 1111 09/06/15 1619  GLUCAP 108* 117* 119* 228* 89       Signed:  Luva Metzger MD.  [facility] 09/06/2015, 5:35 PM

## 2015-09-06 NOTE — Progress Notes (Signed)
Pt seen and examined, admitted this am by Dr.Niu 75/M with multiple med problems, DM, HTN, Dyslipidemia, Cad s/p BMS to Circumflex in 2010 at [facility], CKD 3, PMR on prednisone admitted with atypical chest pain, on Saturday and Sunday, now chest pain free. EKG without acute ST T wave changes, CTA negative for PE or dissection Myoview 11/16: negative for ischemia Cycle enzymes, Ask Cardiology to evaluate, Also has P.Afib on warfarin with subtherapeutic INR, on heparin gtt now, stop this and give a dose of lovenox  Domenic Polite, MD

## 2015-09-06 NOTE — Evaluation (Signed)
Physical Therapy Evaluation Patient Details Name: Curtis Frazier MRN: [id number] DOB: 1940/08/26 Today's Date: 09/06/2015   History of Present Illness  Patient is a 75 y/o male with hx of lumbar surgery 2/2, HIV, psoriatc arthritis, scoliosis, HLD, HTN, MI, RA, and DM who was just discharged from [facility] on 3/23 presents with chest pain.   Clinical Impression  Patient presents with generalized weakness, decreased endurance and balance deficits impacting mobility. Pt recently d/ced from [facility] on 3/23 and was supposed to start HHPT today. Tolerated gait training with Min A for balance/safety. Pt with dyspnea on exertion but vitals stable. Encouraged OOB to chair as much as tolerated and ambulation to maintain strength. Pt reports wife is not able to provide much support at home. Will follow acutely to maximize independence and mobility. Recommend continue HHPT.    Follow Up Recommendations Home health PT;Supervision/Assistance - 24 hour    Equipment Recommendations  None recommended by PT    Recommendations for Other Services OT consult     Precautions / Restrictions Precautions Precautions: Back;Fall Precaution Comments: Recent spine surgery 2/2 Restrictions Weight Bearing Restrictions: No      Mobility  Bed Mobility Overal bed mobility: Needs Assistance Bed Mobility: Rolling;Sidelying to Sit Rolling: Min guard Sidelying to sit: Min guard       General bed mobility comments: Use of rail for support. No assist needed to get to EOB.   Transfers Overall transfer level: Needs assistance Equipment used: Rolling walker (2 wheeled) Transfers: Sit to/from Stand Sit to Stand: Min guard         General transfer comment: Min guard for safety. Cues for technique, hand placement.   Ambulation/Gait Ambulation/Gait assistance: Min assist Ambulation Distance (Feet): 150 Feet Assistive device: Rolling walker (2 wheeled) Gait Pattern/deviations: Step-through pattern;Decreased stride  length;Trendelenburg Gait velocity: decreased   General Gait Details: Intial discomfort in right hip due to arthritis causing trendenberg type gait pattern. 2/4 DOE. HR stable. Fatigues quickly.  Stairs            Wheelchair Mobility    Modified Rankin (Stroke Patients Only)       Balance Overall balance assessment: Needs assistance Sitting-balance support: Feet supported;No upper extremity supported Sitting balance-Leahy Scale: Good     Standing balance support: During functional activity;Bilateral upper extremity supported Standing balance-Leahy Scale: Poor Standing balance comment: Reliant on BUEs for support                             Pertinent Vitals/Pain Pain Assessment: No/denies pain    Home Living Family/patient expects to be discharged to:: Private residence Living Arrangements: Spouse/significant other Available Help at Discharge: Family;Available 24 hours/day Type of Home: House Home Access: Stairs to enter Entrance Stairs-Rails: None Entrance Stairs-Number of Steps: 1 from garage Home Layout: One level Home Equipment: Environmental consultant - 2 wheels;Bedside commode      Prior Function Level of Independence: Independent with assistive device(s)         Comments: Likes country music. Using RW since d/c from [facility] 3/23. Was supposed to start HHPT today, 3/27.     Hand Dominance   Dominant Hand: Right    Extremity/Trunk Assessment   Upper Extremity Assessment: Defer to OT evaluation           Lower Extremity Assessment: Generalized weakness      Cervical / Trunk Assessment: Kyphotic  Communication   Communication: No difficulties  Cognition Arousal/Alertness: Awake/alert Behavior During Therapy: [facility]  for tasks assessed/performed Overall Cognitive Status: Within Functional Limits for tasks assessed                      General Comments      Exercises        Assessment/Plan    PT Assessment Patient needs continued PT  services  PT Diagnosis Difficulty walking;Generalized weakness   PT Problem List Decreased strength;Cardiopulmonary status limiting activity;Decreased activity tolerance;Decreased balance;Decreased mobility;Decreased cognition  PT Treatment Interventions Balance training;Gait training;Functional mobility training;Therapeutic activities;Therapeutic exercise;Patient/family education;Stair training   PT Goals (Current goals can be found in the Care Plan section) Acute Rehab PT Goals Patient Stated Goal: to return to independence PT Goal Formulation: With patient Time For Goal Achievement: 09/20/15 Potential to Achieve Goals: Good    Frequency Min 3X/week   Barriers to discharge Decreased caregiver support Wife has back issues    Co-evaluation               End of Session Equipment Utilized During Treatment: Gait belt Activity Tolerance: Patient limited by fatigue Patient left: in bed;with call bell/phone within reach;with bed alarm set Nurse Communication: Mobility status    Functional Assessment Tool Used: clinical judgement Functional Limitation: Mobility: Walking and moving around Mobility: Walking and Moving Around Current Status [id number]): At least 20 percent but less than 40 percent impaired, limited or restricted Mobility: Walking and Moving Around Goal Status [phone]): At least 1 percent but less than 20 percent impaired, limited or restricted    Time: 1100-1123 PT Time Calculation (min) (ACUTE ONLY): 23 min   Charges:   PT Evaluation $PT Eval Moderate Complexity: 1 Procedure PT Treatments $Gait Training: 8-22 mins   PT G Codes:   PT G-Codes **NOT FOR INPATIENT CLASS** Functional Assessment Tool Used: clinical judgement Functional Limitation: Mobility: Walking and moving around Mobility: Walking and Moving Around Current Status [id number]): At least 20 percent but less than 40 percent impaired, limited or restricted Mobility: Walking and Moving Around Goal Status  [phone]): At least 1 percent but less than 20 percent impaired, limited or restricted    [place] 09/06/2015, 12:13 PM Wray Kearns, [place], DPT [phone]

## 2015-09-06 NOTE — Progress Notes (Signed)
ANTICOAGULATION CONSULT NOTE - Follow Up Consult  Pharmacy Consult for Lovenox  Indication: atrial fibrillation  Allergies  Allergen Reactions  . Ace Inhibitors Other (See Comments)    Causes high K  . Angiotensin Receptor Blockers Other (See Comments)    Causes high K  . Metoprolol Other (See Comments)    May cause elevated K level  . Penicillins Swelling and Rash  . Tetracycline Swelling and Rash    Patient Measurements: Height: 5\' 8"  (172.7 cm) Weight: 172 lb 8 oz (78.245 kg) IBW/kg (Calculated) : 68.4  Vital Signs: Temp: 98.9 F (37.2 C) (03/27 0430) Temp Source: Oral (03/27 0430) BP: 116/51 mmHg (03/27 0430) Pulse Rate: 97 (03/27 0430)  Labs:  Recent Labs  09/05/15 2136 09/06/15 0335 09/06/15 0343  HGB 11.0*  --   --   HCT 33.8*  --   --   PLT 256  --   --   APTT  --   --  35  LABPROT  --  18.4*  --   INR  --  1.52*  --   CREATININE 1.82*  --   --   TROPONINI  --   --  0.04*    Estimated Creatinine Clearance: 33.4 mL/min (by C-G formula based on Cr of 1.82).   Medical History: Past Medical History  Diagnosis Date  . Other and unspecified hyperlipidemia   . Coronary atherosclerosis of unspecified type of vessel, native or graft   . Personal history of colonic polyps   . Actinic keratosis   . Psoriatic arthropathy ([place])   . Other psoriasis   . Scoliosis (and kyphoscoliosis), idiopathic   . GI bleed 12/10    cecam AVM  . Gastritis 12/10  . Hyperlipidemia     takes Pravastatin daily  . Hypertension     takes Metoprolol daily  . Shortness of breath     with exertion  . Headache(784.0)     occasionally  . Joint pain   . Joint swelling   . Chronic back pain     scoliosis/stenosis/radiculopathy,degenerative disc disease  . Psoriasis   . Bruises easily   . Esophageal reflux     takes Omeprazole daily  . History of colonic polyps   . Hemorrhoids   . Kidney stone     hx of  . Blood transfusion 2010  . PMR (polymyalgia rheumatica) (HCC)  01/20/2012    tx by specialist on a long prednisone taper  . S/P PTCA (percutaneous transluminal coronary angioplasty)   . Rheumatoid arthritis(714.0)     takes Methotrexate 7pills weekly  . Degeneration of intervertebral disc, site unspecified   . Myocardial infarction ([place]) 2010  . Pneumonia 2008; 07/2015  . Type II or unspecified type diabetes mellitus without mention of complication, not stated as uncontrolled   . Skin cancer     "arms"  . CHF (congestive heart failure) (HCC)     Medications:  See electronic med rec  Assessment: 75 y.o. M presents with CP. Pt just discharged from rehab on 3/23. Pt on coumadin PTA for afib. Admit INR 1.52 - subtherapeutic. Was transitioned to IV heparin for r/o ACS. Pharmacy consulted to switch to SQ Lovenox.   Home dose: 5mg  daily - last dose 3/25  Goal of Therapy:  Stroke prevention  Monitor platelets by anticoagulation protocol: Yes   Plan:  -Stop heparin and start Lovenox 80 mg BID  -Monitor Q 72 hr CBC and s/s of bleeding -F/u cardiology recommendations   Albertina Parr,  PharmD., BCPS Clinical Pharmacist Pager [phone]

## 2015-09-06 NOTE — ED Notes (Signed)
Admitting MD at bedside.

## 2015-09-06 NOTE — Progress Notes (Signed)
Pt being discharged home via wheelchair with family. Pt alert and oriented x4. VSS. Pt c/o no pain at this time. No signs of respiratory distress. Education complete and care plans resolved. IV removed with catheter intact and pt tolerated well. No further issues at this time. Pt to follow up with PCP. Magin Balbi R, RN 

## 2015-09-06 NOTE — H&P (Addendum)
[facility] History and Physical  Curtis Frazier [id number] DOB: [birth year] DOA: 09/05/2015  Referring physician: ED physician PCP: Loura Pardon, MD  Specialists:   Chief Complaint: Chest pain  HPI: Curtis Frazier is a 75 y.o. male with PMH of hypertension, hyperlipidemia, diabetes mellitus, GERD, CAD, S/P of stent placement, psoriasis, PMR, RA on chronic steroid, skin cancer, diastolic congestive heart failure, chronic kidney disease-stage II, atrial fibrillation on Coumadin, BPH, who presents with chest pain.  Patient reports that he had one episode of chest pain in Saturday night, which resolved spontaneously. He started having chest pain again at about 3 PM. His chest pain has been persistent since started. It is located in the substernal area, 6 out of 10 in severity, sharp, radiating to his back. The chest pain is pleuritic, it is aggravated by deep breath. Patient does not have shortness of breath, cough, fever or chills. No tenderness over calf areas. He states that he did not take Coumadin on Sunday. Patient does not have nausea, vomiting, abdominal pain, diarrhea, symptoms of UTI or unilateral weakness.  In ED, patient was found to have troponin 0.04, BNP 339.2, WBC 9.8, temperature 99.3, worsening renal function. Chest showed perronchitis change. CT angiogram of the chest did not show aortic dissection, no acute vascular process; high-grade stenosis LEFT vertebral artery origin, stable cardiomegaly and small residual pericardial effusion. Patient is admitted to inpatient for further evaluation and treatment.  EKG: Independently reviewed. QTC 486, PVC, poor R-wave progression  Where does patient live?   At home  Can patient participate in ADLs?  Little   Review of Systems:   General: no fevers, chills, no changes in body weight, has poor appetite, has fatigue HEENT: no blurry vision, hearing changes or sore throat Pulm: no dyspnea, coughing, wheezing CV: has chest pain, no  palpitations Abd: no nausea, vomiting, abdominal pain, diarrhea, constipation GU: no dysuria, burning on urination, increased urinary frequency, hematuria  Ext: no leg edema Neuro: no unilateral weakness, numbness, or tingling, no vision change or hearing loss Skin: no rash MSK: No muscle spasm, no deformity, no limitation of range of movement in spin Heme: No easy bruising.  Travel history: No recent long distant travel.  Allergy:  Allergies  Allergen Reactions  . Ace Inhibitors Other (See Comments)    Causes high K  . Angiotensin Receptor Blockers Other (See Comments)    Causes high K  . Metoprolol Other (See Comments)    May cause elevated K level  . Penicillins Swelling and Rash  . Tetracycline Swelling and Rash    Past Medical History  Diagnosis Date  . Other and unspecified hyperlipidemia   . Coronary atherosclerosis of unspecified type of vessel, native or graft   . Personal history of colonic polyps   . Actinic keratosis   . Psoriatic arthropathy ([place])   . Other psoriasis   . Scoliosis (and kyphoscoliosis), idiopathic   . GI bleed 12/10    cecam AVM  . Gastritis 12/10  . Hyperlipidemia     takes Pravastatin daily  . Hypertension     takes Metoprolol daily  . Shortness of breath     with exertion  . Headache(784.0)     occasionally  . Joint pain   . Joint swelling   . Chronic back pain     scoliosis/stenosis/radiculopathy,degenerative disc disease  . Psoriasis   . Bruises easily   . Esophageal reflux     takes Omeprazole daily  . History of colonic polyps   .  Hemorrhoids   . Kidney stone     hx of  . Blood transfusion 2010  . PMR (polymyalgia rheumatica) (HCC) 01/20/2012    tx by specialist on a long prednisone taper  . S/P PTCA (percutaneous transluminal coronary angioplasty)   . Rheumatoid arthritis(714.0)     takes Methotrexate 7pills weekly  . Degeneration of intervertebral disc, site unspecified   . Myocardial infarction ([place]) 2010  .  Pneumonia 2008; 07/2015  . Type II or unspecified type diabetes mellitus without mention of complication, not stated as uncontrolled   . Skin cancer     "arms"  . CHF (congestive heart failure) [facility])     Past Surgical History  Procedure Laterality Date  . Rotator cuff repair Left 2000  . Colonoscopy    . Vasectomy  1979  . Coronary angioplasty with stent placement  02/2009    has one stent  . Carotid doppler  10/12    0-39% R and 60-79% left   . Cataract extraction  2012  . Cardiac catheterization  2010  . Lithotripsy  2009  . Tee without cardioversion N/A 08/06/2015    Procedure: TRANSESOPHAGEAL ECHOCARDIOGRAM (TEE);  Surgeon: Skeet Latch, MD;  Location: [facility];  Service: Cardiovascular;  Laterality: N/A;  . Cardioversion N/A 08/06/2015    Procedure: CARDIOVERSION;  Surgeon: Skeet Latch, MD;  Location: [facility] ENDOSCOPY;  Service: Cardiovascular;  Laterality: N/A;  . Back surgery    . Lumbar laminectomy  07/2015  . Posterior laminectomy thoracic spine      "fixed the discs"  . Skin cancer excision      "arms"    Social History:  reports that he has quit smoking. His smoking use included Cigarettes. He has a 79.5 pack-year smoking history. He has never used smokeless tobacco. He reports that he drinks alcohol. He reports that he does not use illicit drugs.  Family History:  Family History  Problem Relation Age of Onset  . Coronary artery disease Mother   . Diabetes Mother   . CAD Mother   . Coronary artery disease Sister   . Hypertension Sister   . Kidney failure Sister   . CAD Sister   . Diabetes Brother   . Prostate cancer Brother   . Pancreatic cancer Brother   . Diabetes Brother   . Anesthesia problems Neg Hx   . Hypotension Neg Hx   . Malignant hyperthermia Neg Hx   . Pseudochol deficiency Neg Hx      Prior to Admission medications   Medication Sig Start Date End Date Taking? Authorizing Provider  acetaminophen (TYLENOL) 325 MG tablet Take 650 mg by  mouth every 6 (six) hours as needed (Pain scale of 1-4 or temp > 101).   Yes Historical Provider, MD  amiodarone (PACERONE) 400 MG tablet Take 1 tablet (400 mg total) by mouth daily. 09/02/15  Yes Ivan Anchors Love, PA-C  calcium-vitamin D (OSCAL WITH D) 500-200 MG-UNIT tablet Take 1 tablet by mouth daily with breakfast.   Yes Historical Provider, MD  ferrous sulfate 325 (65 FE) MG tablet Take 1 tablet (325 mg total) by mouth 3 (three) times daily with meals. 09/02/15  Yes Ivan Anchors Love, PA-C  glipiZIDE (GLUCOTROL) 5 MG tablet Take 1 tablet (5 mg total) by mouth daily before breakfast. 09/02/15  Yes Ivan Anchors Love, PA-C  pantoprazole (PROTONIX) 40 MG tablet Take 1 tablet (40 mg total) by mouth daily. 09/02/15  Yes Ivan Anchors Love, PA-C  thiamine 100 MG tablet Take  1 tablet (100 mg total) by mouth daily. 09/02/15  Yes Ivan Anchors Love, PA-C  warfarin (COUMADIN) 5 MG tablet Take 1 tablet (5 mg total) by mouth daily at 6 PM. 09/02/15  Yes Ivan Anchors Love, PA-C  atorvastatin (LIPITOR) 20 MG tablet Take 20 mg by mouth daily at 6 PM. Reported on 09/05/2015    Historical Provider, MD  predniSONE (DELTASONE) 10 MG tablet Take 5-10 mg by mouth daily with breakfast.     Historical Provider, MD  senna (SENOKOT) 8.6 MG TABS tablet Take 2 tablets (17.2 mg total) by mouth daily. 09/02/15   Bary Leriche, PA-C    Physical Exam: Filed Vitals:   09/06/15 0100 09/06/15 0145 09/06/15 0230 09/06/15 0430  BP: 111/75 118/63 130/61 116/51  Pulse: 90 92 96 97  Temp:    98.9 F (37.2 C)  TempSrc:    Oral  Resp: 21 23 20 18   Height:      Weight:    78.245 kg (172 lb 8 oz)  SpO2: 95% 96% 97% 96%   General: Not in acute distress HEENT:       Eyes: PERRL, EOMI, no scleral icterus.       ENT: No discharge from the ears and nose, no pharynx injection, no tonsillar enlargement.        Neck: No JVD, no bruit, no mass felt. Heme: No neck lymph node enlargement. Cardiac: S1/S2, RRR, No murmurs, No gallops or rubs. Pulm: No rales,  wheezing, rhonchi or rubs. Abd: Soft, nondistended, nontender, no rebound pain, no organomegaly, BS present. Ext: No pitting leg edema bilaterally. 2+DP/PT pulse bilaterally. Musculoskeletal: No joint deformities, No joint redness or warmth, no limitation of ROM in spin. Skin: No rashes.  Neuro: Alert, oriented X3, cranial nerves II-XII grossly intact, moves all extremities normally. Psych: Patient is not psychotic, no suicidal or hemocidal ideation.  Labs on Admission:  Basic Metabolic Panel:  Recent Labs Lab 08/31/15 0610 09/01/15 0637 09/05/15 2136  NA 138 140 137  K 4.7 5.0 4.7  CL 106 108 107  CO2 24 24 21*  GLUCOSE 119* 123* 216*  BUN 20 20 21*  CREATININE 1.37* 1.38* 1.82*  CALCIUM 8.9 8.9 8.7*   Liver Function Tests: No results for input(s): AST, ALT, ALKPHOS, BILITOT, PROT, ALBUMIN in the last 168 hours. No results for input(s): LIPASE, AMYLASE in the last 168 hours. No results for input(s): AMMONIA in the last 168 hours. CBC:  Recent Labs Lab 09/05/15 2136  WBC 9.8  HGB 11.0*  HCT 33.8*  MCV 98.8  PLT 256   Cardiac Enzymes:  Recent Labs Lab 09/06/15 0343  TROPONINI 0.04*    BNP (last 3 results)  Recent Labs  08/09/15 0540 08/12/15 1040 09/06/15 0343  BNP 357.5* 222.8* 339.2*    ProBNP (last 3 results) No results for input(s): PROBNP in the last 8760 hours.  CBG:  Recent Labs Lab 09/01/15 1617 09/01/15 2056 09/02/15 0643 09/02/15 1146 09/06/15 0534  GLUCAP 226* 98 108* 117* 119*    Radiological Exams on Admission: Dg Chest 2 View  09/05/2015  CLINICAL DATA:  Acute onset of mid chest pain, radiating to the back. Initial encounter. EXAM: CHEST  2 VIEW COMPARISON:  Chest radiograph from 08/17/2015 FINDINGS: The lungs are well-aerated. Peribronchial thickening is noted, with mild bilateral atelectasis or scarring. There is no evidence of pleural effusion or pneumothorax. The heart is borderline normal in size. No acute osseous  abnormalities are seen. Thoracolumbar spinal fusion hardware is  noted. Chronic compression deformities are noted at the mid thoracic spine. IMPRESSION: Peribronchial thickening, with mild bilateral atelectasis or scarring. Lungs otherwise grossly clear. Electronically Signed   By: Garald Balding M.D.   On: 09/05/2015 21:19   Ct Angio Chest Aorta W/cm &/or Wo/cm  09/06/2015  CLINICAL DATA:  Chest pain radiating to the back beginning at 1600 hours. Similar symptoms September 04, 2015. History of polymyalgia rheumatica, diabetes, CHF. EXAM: CT ANGIOGRAPHY CHEST WITH AND WITHOUT CONTRAST TECHNIQUE: Multidetector CT imaging of the chest was performed using the standard protocol during bolus administration of intravenous contrast. Multiplanar CT image reconstructions and MIPs were obtained to evaluate the vascular anatomy. CONTRAST:  80mL OMNIPAQUE IOHEXOL 350 MG/ML SOLN COMPARISON:  CT chest July 26, 2015 and chest radiograph September 05, 2015 FINDINGS: MEDIASTINUM: The heart is mildly enlarged, similar to prior examination. Small residual pericardial effusion. Moderate coronary artery calcifications. Noncontrast CT of the thoracic aorta shows no abnormal density. Postcontrast imaging of the aorta shows homogeneous contrast opacification, normal in course and caliber with moderate calcific atherosclerosis and and intimal thickening. No aneurysm, dissection, suspicious luminal irregularity, periaortic fluid collections or contrast extravasation. High-grade stenosis LEFT V1 segment due to atherosclerosis, patent arch vessels. Scattered prominent though not pathologically enlarged mediastinal lymph nodes measure up to 9 mm, previously 11 mm short axis. LUNGS: Tracheobronchial tree is patent, no pneumothorax. Complete resolution of pleural effusions. Dependent atelectasis and/or scarring. RIGHT upper lobe atelectasis. No focal consolidation. Interval extubation. SOFT TISSUES AND OSSEOUS STRUCTURES: Thoracolumbar PLIF.  Osteopenia stable mild T5, moderate to severe to T7, moderate T8 mild T9 compression fractures. Multiple small gallstones. Colonic diverticulosis partially imaged. 13 mm exophytic RIGHT interpolar cyst. Multiple thyroid nodules measure up to 14 mm, which do not reach size criteria for recommended follow-up. Review of the MIP images confirms the above findings. IMPRESSION: No acute vascular process. Moderate atherosclerosis and high-grade stenosis LEFT vertebral artery origin. Stable cardiomegaly, small residual pericardial effusion. Resolution of pleural effusions. No acute pulmonary process. Electronically Signed   By: Elon Alas M.D.   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 His chest pain is pleuritic, therefore potential differential diagnoses would include pericarditis/pleurisy given history of PMR/RA and pulmonary embolism. He does not have shortness of breath, oxygen saturation or signs of DVT, less likely to have pulmonary embolism.  - will admit to Tele bed  - start IV heparin - cycle CE q6 x3 and repeat her EKG in the am  - Nitroglycerin, Morphine, and aspirin, lipitor  - Risk factor stratification: will check FLP and A1C  - 2d echo - please call Card in AM  Atrial Fibrillation: CHA2DS2-VASc Score is 5, needs oral anticoagulation. Patient is on Coumadin at home. INR is subtherapeutic 1.5 on admission. Heart rate is well  controlled. -will hold coumadin in case pt need cardiac cath -continue amiodarone  DM-II: Last A1c 7.5 on 01/01/15, fairly well controled. Patient is taking glipizide at home -SSI -Check A1c  GERD: -Protonix  Rheumatoid arthritis/PMR -continue home prednisone, 5 mg daily. -Solu Cortef 50 mg 1 for stress dose -Check cortisol level  Acute on chronic renal failure-II: Likely due to prerenal secondary to dehydration - IVF: NS 75 cc/h x 6hours - Check FeNa  - Follow up renal function by BMP  Diastolic congestive heart failure: 2-D echo on 07/20/15 showed EF 65-70 percent. Patient is not taking diuretics at home. No leg edema. CHF is compensated. -Check BNP   DVT ppx: On IV heparin  Code Status: Full code Family Communication:  Yes, patient's son and daguhter at bed side Disposition Plan: Admit to inpatient   Date of Service 09/06/2015    Ivor Costa [facility] Pager [phone]  If 7PM-7AM, please contact night-coverage [email or web address] Password [facility] 09/06/2015, 6:44 AM

## 2015-09-06 NOTE — Consult Note (Signed)
Admit date: 09/05/2015 Referring Physician  Dr. Broadus John Primary Physician Loura Pardon, MD Primary Cardiologist  Dr. Johnsie Cancel Reason for Consultation  Chest pain  HPI: 75 year old male with history of paroxysmal atrial fibrillation, chronic antegrade relation, coronary disease status post circumflex bare-metal stent in 2010 with recent nuclear stress test low risk in November 2016, chronic kidney disease stage III, polymyalgia rheumatica requiring chronic steroids, history of GI bleed 2014, AVMs who failed cardioversion on 08/06/15 in the hospital setting here with atypical chest discomfort.  Over the past 2 days had waxing and waning discomfort and on Sunday pain lasted most of the day. He felt as though he had one episode of chest discomfort on Saturday night which was sharp, moderate in severity and radiated to his back. Sounded quite pleuritic aggravated by deep breathing. No shortness of breath. No cough. No fevers. Currently pain-free. In the emergency department, no acute ST segment changes were noted on EKG and CT scan was negative for PE or dissection. Once again nuclear stress test was negative for ischemia on 04/2015. Troponin 0.04. Atrial fibrillation has been well controlled, INR 1.5 subtherapeutic on INR getting Lovenox. Creatinine 1.82.  EKG demonstrates sinus rhythm with frequent PVCs no ST segment changes. No significant change from prior EKG. Telemetry also personally viewed which shows sinus rhythm, PVCs. No ventricular tachycardia, no significant pauses.  PMH:   Past Medical History  Diagnosis Date  . Other and unspecified hyperlipidemia   . Coronary atherosclerosis of unspecified type of vessel, native or graft   . Personal history of colonic polyps   . Actinic keratosis   . Psoriatic arthropathy ([place])   . Other psoriasis   . Scoliosis (and kyphoscoliosis), idiopathic   . GI bleed 12/10    cecam AVM  . Gastritis 12/10  . Hyperlipidemia     takes Pravastatin daily  .  Hypertension     takes Metoprolol daily  . Shortness of breath     with exertion  . Headache(784.0)     occasionally  . Joint pain   . Joint swelling   . Chronic back pain     scoliosis/stenosis/radiculopathy,degenerative disc disease  . Psoriasis   . Bruises easily   . Esophageal reflux     takes Omeprazole daily  . History of colonic polyps   . Hemorrhoids   . Kidney stone     hx of  . Blood transfusion 2010  . PMR (polymyalgia rheumatica) (HCC) 01/20/2012    tx by specialist on a long prednisone taper  . S/P PTCA (percutaneous transluminal coronary angioplasty)   . Rheumatoid arthritis(714.0)     takes Methotrexate 7pills weekly  . Degeneration of intervertebral disc, site unspecified   . Myocardial infarction ([place]) 2010  . Pneumonia 2008; 07/2015  . Type II or unspecified type diabetes mellitus without mention of complication, not stated as uncontrolled   . Skin cancer     "arms"  . CHF (congestive heart failure) (HCC)     PSH:   Past Surgical History  Procedure Laterality Date  . Rotator cuff repair Left 2000  . Colonoscopy    . Vasectomy  1979  . Coronary angioplasty with stent placement  02/2009    has one stent  . Carotid doppler  10/12    0-39% R and 60-79% left   . Cataract extraction  2012  . Cardiac catheterization  2010  . Lithotripsy  2009  . Tee without cardioversion N/A 08/06/2015    Procedure: TRANSESOPHAGEAL ECHOCARDIOGRAM (  TEE);  Surgeon: Skeet Latch, MD;  Location: [facility];  Service: Cardiovascular;  Laterality: N/A;  . Cardioversion N/A 08/06/2015    Procedure: CARDIOVERSION;  Surgeon: Skeet Latch, MD;  Location: [facility] ENDOSCOPY;  Service: Cardiovascular;  Laterality: N/A;  . Back surgery    . Lumbar laminectomy  07/2015  . Posterior laminectomy thoracic spine      "fixed the discs"  . Skin cancer excision      "arms"   Allergies:  Ace inhibitors; Angiotensin receptor blockers; Metoprolol; Penicillins; and Tetracycline Prior to  Admit Meds:   Prior to Admission medications   Medication Sig Start Date End Date Taking? Authorizing Provider  acetaminophen (TYLENOL) 325 MG tablet Take 650 mg by mouth every 6 (six) hours as needed (Pain scale of 1-4 or temp > 101).   Yes Historical Provider, MD  amiodarone (PACERONE) 400 MG tablet Take 1 tablet (400 mg total) by mouth daily. 09/02/15  Yes Ivan Anchors Love, PA-C  calcium-vitamin D (OSCAL WITH D) 500-200 MG-UNIT tablet Take 1 tablet by mouth daily with breakfast.   Yes Historical Provider, MD  ferrous sulfate 325 (65 FE) MG tablet Take 1 tablet (325 mg total) by mouth 3 (three) times daily with meals. 09/02/15  Yes Ivan Anchors Love, PA-C  glipiZIDE (GLUCOTROL) 5 MG tablet Take 1 tablet (5 mg total) by mouth daily before breakfast. 09/02/15  Yes Ivan Anchors Love, PA-C  pantoprazole (PROTONIX) 40 MG tablet Take 1 tablet (40 mg total) by mouth daily. 09/02/15  Yes Ivan Anchors Love, PA-C  thiamine 100 MG tablet Take 1 tablet (100 mg total) by mouth daily. 09/02/15  Yes Ivan Anchors Love, PA-C  warfarin (COUMADIN) 5 MG tablet Take 1 tablet (5 mg total) by mouth daily at 6 PM. 09/02/15  Yes Ivan Anchors Love, PA-C  atorvastatin (LIPITOR) 20 MG tablet Take 20 mg by mouth daily at 6 PM. Reported on 09/05/2015    Historical Provider, MD  predniSONE (DELTASONE) 10 MG tablet Take 5-10 mg by mouth daily with breakfast.     Historical Provider, MD  senna (SENOKOT) 8.6 MG TABS tablet Take 2 tablets (17.2 mg total) by mouth daily. 09/02/15   Bary Leriche, PA-C   Fam HX:    Family History  Problem Relation Age of Onset  . Coronary artery disease Mother   . Diabetes Mother   . CAD Mother   . Coronary artery disease Sister   . Hypertension Sister   . Kidney failure Sister   . CAD Sister   . Diabetes Brother   . Prostate cancer Brother   . Pancreatic cancer Brother   . Diabetes Brother   . Anesthesia problems Neg Hx   . Hypotension Neg Hx   . Malignant hyperthermia Neg Hx   . Pseudochol deficiency Neg Hx     Social HX:    Social History   Social History  . Marital Status: Married    Spouse Name: N/A  . Number of Children: N/A  . Years of Education: N/A   Occupational History  . Not on file.   Social History Main Topics  . Smoking status: Former Smoker -- 1.50 packs/day for 53 years    Types: Cigarettes  . Smokeless tobacco: Never Used     Comment: quit smoking cigarettes in 2010  . Alcohol Use: 0.0 oz/week    0 Standard drinks or equivalent per week     Comment: 08/11/2015 "might have 1-2 margaritas/month in the summertime"  . Drug Use:  No  . Sexual Activity: No   Other Topics Concern  . Not on file   Social History Narrative     ROS:  All 11 ROS were addressed and are negative except what is stated in the HPI   Physical Exam: Blood pressure 116/51, pulse 97, temperature 98.9 F (37.2 C), temperature source Oral, resp. rate 18, height 5\' 8"  (1.727 m), weight 172 lb 8 oz (78.245 kg), SpO2 96 %.   General: Well developed, well nourished, in no acute distress Head: Eyes PERRLA, No xanthomas.   Normal cephalic and atramatic  Lungs:   Clear bilaterally to auscultation and percussion. Normal respiratory effort. No wheezes, no rales. Heart:   HRRR S1 S2 Occasional ectopy Pulses are 2+ & equal. No murmur, rubs, gallops.  No carotid bruit. No JVD.  No abdominal bruits.  Abdomen: Bowel sounds are positive, abdomen soft and non-tender without masses. No hepatosplenomegaly. Msk:  Back normal. Normal strength and tone for age. Extremities:  No clubbing, cyanosis or edema.  DP +1, warm skin to touch, sleeping, back is warm slightly diaphoretic. Neuro: Alert and oriented X 3, non-focal, MAE x 4 GU: Deferred Rectal: Deferred Psych:  Good affect, responds appropriately      Labs: Lab Results  Component Value Date   WBC 9.8 09/05/2015   HGB 11.0* 09/05/2015   HCT 33.8* 09/05/2015   MCV 98.8 09/05/2015   PLT 256 09/05/2015     Recent Labs Lab 09/05/15 2136  NA 137  K 4.7   CL 107  CO2 21*  BUN 21*  CREATININE 1.82*  CALCIUM 8.7*  GLUCOSE 216*    Recent Labs  09/06/15 0343  TROPONINI 0.04*   Lab Results  Component Value Date   CHOL 155 09/06/2015   HDL 43 09/06/2015   LDLCALC 100* 09/06/2015   TRIG 61 09/06/2015   No results found for: DDIMER   Radiology:  Dg Chest 2 View  09/05/2015  CLINICAL DATA:  Acute onset of mid chest pain, radiating to the back. Initial encounter. EXAM: CHEST  2 VIEW COMPARISON:  Chest radiograph from 08/17/2015 FINDINGS: The lungs are well-aerated. Peribronchial thickening is noted, with mild bilateral atelectasis or scarring. There is no evidence of pleural effusion or pneumothorax. The heart is borderline normal in size. No acute osseous abnormalities are seen. Thoracolumbar spinal fusion hardware is noted. Chronic compression deformities are noted at the mid thoracic spine. IMPRESSION: Peribronchial thickening, with mild bilateral atelectasis or scarring. Lungs otherwise grossly clear. Electronically Signed   By: Garald Balding M.D.   On: 09/05/2015 21:19   Ct Angio Chest Aorta W/cm &/or Wo/cm  09/06/2015  CLINICAL DATA:  Chest pain radiating to the back beginning at 1600 hours. Similar symptoms September 04, 2015. History of polymyalgia rheumatica, diabetes, CHF. EXAM: CT ANGIOGRAPHY CHEST WITH AND WITHOUT CONTRAST TECHNIQUE: Multidetector CT imaging of the chest was performed using the standard protocol during bolus administration of intravenous contrast. Multiplanar CT image reconstructions and MIPs were obtained to evaluate the vascular anatomy. CONTRAST:  34mL OMNIPAQUE IOHEXOL 350 MG/ML SOLN COMPARISON:  CT chest July 26, 2015 and chest radiograph September 05, 2015 FINDINGS: MEDIASTINUM: The heart is mildly enlarged, similar to prior examination. Small residual pericardial effusion. Moderate coronary artery calcifications. Noncontrast CT of the thoracic aorta shows no abnormal density. Postcontrast imaging of the aorta shows  homogeneous contrast opacification, normal in course and caliber with moderate calcific atherosclerosis and and intimal thickening. No aneurysm, dissection, suspicious luminal irregularity, periaortic fluid collections or  contrast extravasation. High-grade stenosis LEFT V1 segment due to atherosclerosis, patent arch vessels. Scattered prominent though not pathologically enlarged mediastinal lymph nodes measure up to 9 mm, previously 11 mm short axis. LUNGS: Tracheobronchial tree is patent, no pneumothorax. Complete resolution of pleural effusions. Dependent atelectasis and/or scarring. RIGHT upper lobe atelectasis. No focal consolidation. Interval extubation. SOFT TISSUES AND OSSEOUS STRUCTURES: Thoracolumbar PLIF. Osteopenia stable mild T5, moderate to severe to T7, moderate T8 mild T9 compression fractures. Multiple small gallstones. Colonic diverticulosis partially imaged. 13 mm exophytic RIGHT interpolar cyst. Multiple thyroid nodules measure up to 14 mm, which do not reach size criteria for recommended follow-up. Review of the MIP images confirms the above findings. IMPRESSION: No acute vascular process. Moderate atherosclerosis and high-grade stenosis LEFT vertebral artery origin. Stable cardiomegaly, small residual pericardial effusion. Resolution of pleural effusions. No acute pulmonary process. Electronically Signed   By: Elon Alas M.D.   On: 09/06/2015 02:04   Personally viewed.  EKG:  As described above, multiple reviewed. Personally viewed.   ASSESSMENT/PLAN:    75 year old male with paroxysmal atrial fibrillation, atypical chest pain, known coronary disease with circumflex bare-metal stent in 2010, subtherapeutic INR, polymyalgia rheumatica on chronic prednisone, diabetes, chronic kidney disease-creatinine 1.9 currently chest pain-free with troponin 0.04.  Atypical chest pain -Given recent nuclear stress test in 05/12/2015 which was low risk, no ischemia and current chest pain-free  status and quite atypical chest discomfort over the weekend, we will go ahead and continue with medical management. If pain worsens or becomes more worrisome especially during exertional activity, further diagnostic angiogram may be helpful to examine previously placed circumflex stent and other anatomy. Of course this would require holding Coumadin prior to cardiac catheterization. Continue with follow-up, Dr. Johnsie Cancel. Minimally elevated troponin noted. 0.04.  Atrial fibrillation-paroxysmal -Currently sinus rhythm.CHA2DS2-VASc Score is 5, needs oral anticoagulation. Patient is on Coumadin at home. INR is subtherapeutic 1.5 on admission. Heart rate is well controlled. -Currently on amiodarone. I will decrease dosage to 200 mg once a day.  Diabetes -Per primary team hemoglobin A1c 7.5-glipizide  Polymyalgia rheumatica -Home prednisone. Of course this can lead sometimes to esophagitis which can cause discomfort.  Chronic diastolic heart failure -EF 65-70% previously. No diuretics taken at home. No edema. Heart failure is well compensated. BNP is slightly elevated.  Previous hospitalization reviewed with encephalopathy.  Candee Furbish, MD  09/06/2015  8:22 AM

## 2015-09-06 NOTE — Care Management Obs Status (Signed)
[place] NOTIFICATION   Patient Details  Name: Curtis Frazier MRN: [id number] Date of Birth: [birth year]   Medicare Observation Status Notification Given:  Yes    Marvetta Gibbons [place], RN 09/06/2015, 4:02 PM

## 2015-09-06 NOTE — Care Management Note (Signed)
Case Management Note Marvetta Gibbons RN, BSN Unit 2W-Case Manager [phone]  Patient Details  Name: Curtis Frazier MRN: [id number] Date of Birth: 04-11-1941  Subjective/Objective:     Pt admitted with c/p               Action/Plan: PTA pt lived at home- recently discharged from [facility]- was active with [facility] for HH-RN/PT- as pt is observation- [facility] services can resume on discharge- have Spoken with Stanton Kidney at [place] who is aware of pt's status here in hospital. - CM to follow  Expected Discharge Date:                  Expected Discharge Plan:  [place]  In-House Referral:     Discharge planning Services  CM Consult  Post Acute Care Choice:  Home Health, Resumption of Svcs/PTA Provider Choice offered to:  Patient  DME Arranged:    DME Agency:     HH Arranged:  RN, PT HH Agency:  [facility]  Status of Service:  Completed, signed off  Medicare Important Message Given:    Date Medicare IM Given:    Medicare IM give by:    Date Additional Medicare IM Given:    Additional Medicare Important Message give by:     If discussed at [place] of Stay Meetings, dates discussed:    Additional Comments:  Dawayne Patricia, RN 09/06/2015, 2:06 PM

## 2015-09-06 NOTE — Telephone Encounter (Addendum)
Spoke with wife, Doris  1. Are you/is patient experiencing any problems since coming home? Are there any questions regarding any aspect of care? No trouble 2. Are there any questions regarding medications administration/dosing? Are meds being taken as prescribed? Got all of his medications 3. Have there been any falls? No  4. Has Home Health been to the house and/or have they contacted you? If not, have you tried to contact them? Can we help you contact them? HHC was there Friday 5. Are bowels and bladder emptying properly? Are there any unexpected incontinence issues? If applicable, is patient following bowel/bladder programs? No 6. Any fevers, problems with breathing, unexpected pain? No 7. Are there any skin problems or new areas of breakdown? No 8. Has the patient/family member arranged specialty MD follow up (ie cardiology/neurology/renal/surgical/etc)?  Can we help arrange? Not that she knows of 9. Does the patient need any other services or support that we can help arrange? She doesn't know. Daughter takes him to all his appointments 10. Are caregivers following through as expected in assisting the patient? Wife with him 24/7  F/U made on September 15, 2015 at 10:45 with Dr. Posey Pronto for first visit.

## 2015-09-06 NOTE — Telephone Encounter (Signed)
Left voicemail giving Estill Bamberg the verbal orders she requested

## 2015-09-06 NOTE — Progress Notes (Addendum)
ANTICOAGULATION CONSULT NOTE - Initial Consult  Pharmacy Consult for Coumadin and Heparin Indication: atrial fibrillation  Allergies  Allergen Reactions  . Ace Inhibitors Other (See Comments)    Causes high K  . Angiotensin Receptor Blockers Other (See Comments)    Causes high K  . Metoprolol Other (See Comments)    May cause elevated K level  . Penicillins Swelling and Rash  . Tetracycline Swelling and Rash    Patient Measurements: Height: 5\' 8"  (172.7 cm) Weight: 176 lb 1.6 oz (79.878 kg) IBW/kg (Calculated) : 68.4  Vital Signs: Temp: 99.3 F (37.4 C) (03/26 2301) Temp Source: Oral (03/26 2301) BP: 130/61 mmHg (03/27 0230) Pulse Rate: 96 (03/27 0230)  Labs:  Recent Labs  09/05/15 2136 09/06/15 0335  HGB 11.0*  --   HCT 33.8*  --   PLT 256  --   LABPROT  --  18.4*  INR  --  1.52*  CREATININE 1.82*  --     Estimated Creatinine Clearance: 33.4 mL/min (by C-G formula based on Cr of 1.82).   Medical History: Past Medical History  Diagnosis Date  . Other and unspecified hyperlipidemia   . Coronary atherosclerosis of unspecified type of vessel, native or graft   . Personal history of colonic polyps   . Actinic keratosis   . Psoriatic arthropathy ([place])   . Other psoriasis   . Scoliosis (and kyphoscoliosis), idiopathic   . GI bleed 12/10    cecam AVM  . Gastritis 12/10  . Hyperlipidemia     takes Pravastatin daily  . Hypertension     takes Metoprolol daily  . Shortness of breath     with exertion  . Headache(784.0)     occasionally  . Joint pain   . Joint swelling   . Chronic back pain     scoliosis/stenosis/radiculopathy,degenerative disc disease  . Psoriasis   . Bruises easily   . Esophageal reflux     takes Omeprazole daily  . History of colonic polyps   . Hemorrhoids   . Kidney stone     hx of  . Blood transfusion 2010  . PMR (polymyalgia rheumatica) (HCC) 01/20/2012    tx by specialist on a long prednisone taper  . S/P PTCA (percutaneous  transluminal coronary angioplasty)   . Rheumatoid arthritis(714.0)     takes Methotrexate 7pills weekly  . Degeneration of intervertebral disc, site unspecified   . Myocardial infarction ([place]) 2010  . Pneumonia 2008; 07/2015  . Type II or unspecified type diabetes mellitus without mention of complication, not stated as uncontrolled   . Skin cancer     "arms"  . CHF (congestive heart failure) (HCC)     Medications:  See electronic med rec  Assessment: 75 y.o. M presents with CP. Pt just discharged from rehab on 3/23. Pt on coumadin PTA for afib. Admit INR 1.52 - subtherapeutic. CBC ok on admission. Home dose: 5mg  daily - last dose 3/25  Goal of Therapy:  INR 2-3 Monitor platelets by anticoagulation protocol: Yes   Plan:  Daily INR Coumadin 7.5mg  tonight  Sherlon Handing, PharmD, BCPS Clinical pharmacist, pager [phone] 09/06/2015,4:06 AM   ADDENDUM: Adding heparin bridge for afib and r/o ACS.   Plan: Heparin gtt at 1100. No bolus. Will f/u 8 hr heparin level Daily heparin level and CBC  Sherlon Handing, PharmD, BCPS Clinical pharmacist, pager [phone] 09/06/2015 5:01 AM

## 2015-09-07 ENCOUNTER — Inpatient Hospital Stay (HOSPITAL_COMMUNITY)
Admission: EM | Admit: 2015-09-07 | Discharge: 2015-09-16 | DRG: 291 | Disposition: A | Discharge status: Home Health | Payer: Medicare Other | Attending: Internal Medicine | Admitting: Internal Medicine

## 2015-09-07 ENCOUNTER — Emergency Department (HOSPITAL_COMMUNITY): Payer: Medicare Other

## 2015-09-07 ENCOUNTER — Encounter (HOSPITAL_COMMUNITY): Payer: Self-pay | Admitting: Emergency Medicine

## 2015-09-07 ENCOUNTER — Inpatient Hospital Stay (HOSPITAL_COMMUNITY): Payer: Medicare Other

## 2015-09-07 ENCOUNTER — Telehealth: Payer: Self-pay | Admitting: Family Medicine

## 2015-09-07 DIAGNOSIS — J181 Lobar pneumonia, unspecified organism: Secondary | ICD-10-CM | POA: Diagnosis present

## 2015-09-07 DIAGNOSIS — Z8249 Family history of ischemic heart disease and other diseases of the circulatory system: Secondary | ICD-10-CM

## 2015-09-07 DIAGNOSIS — I493 Ventricular premature depolarization: Secondary | ICD-10-CM | POA: Diagnosis present

## 2015-09-07 DIAGNOSIS — I10 Essential (primary) hypertension: Secondary | ICD-10-CM | POA: Diagnosis not present

## 2015-09-07 DIAGNOSIS — I4892 Unspecified atrial flutter: Secondary | ICD-10-CM | POA: Diagnosis not present

## 2015-09-07 DIAGNOSIS — I481 Persistent atrial fibrillation: Secondary | ICD-10-CM | POA: Diagnosis present

## 2015-09-07 DIAGNOSIS — I5033 Acute on chronic diastolic (congestive) heart failure: Secondary | ICD-10-CM | POA: Insufficient documentation

## 2015-09-07 DIAGNOSIS — T8189XA Other complications of procedures, not elsewhere classified, initial encounter: Secondary | ICD-10-CM | POA: Diagnosis present

## 2015-09-07 DIAGNOSIS — K8020 Calculus of gallbladder without cholecystitis without obstruction: Secondary | ICD-10-CM | POA: Diagnosis present

## 2015-09-07 DIAGNOSIS — N400 Enlarged prostate without lower urinary tract symptoms: Secondary | ICD-10-CM | POA: Diagnosis present

## 2015-09-07 DIAGNOSIS — D638 Anemia in other chronic diseases classified elsewhere: Secondary | ICD-10-CM | POA: Diagnosis present

## 2015-09-07 DIAGNOSIS — I959 Hypotension, unspecified: Secondary | ICD-10-CM | POA: Diagnosis not present

## 2015-09-07 DIAGNOSIS — Z955 Presence of coronary angioplasty implant and graft: Secondary | ICD-10-CM | POA: Diagnosis not present

## 2015-09-07 DIAGNOSIS — R079 Chest pain, unspecified: Secondary | ICD-10-CM | POA: Diagnosis not present

## 2015-09-07 DIAGNOSIS — Z981 Arthrodesis status: Secondary | ICD-10-CM

## 2015-09-07 DIAGNOSIS — I482 Chronic atrial fibrillation: Secondary | ICD-10-CM | POA: Diagnosis present

## 2015-09-07 DIAGNOSIS — M4120 Other idiopathic scoliosis, site unspecified: Secondary | ICD-10-CM | POA: Diagnosis present

## 2015-09-07 DIAGNOSIS — Z841 Family history of disorders of kidney and ureter: Secondary | ICD-10-CM

## 2015-09-07 DIAGNOSIS — R5381 Other malaise: Secondary | ICD-10-CM | POA: Diagnosis not present

## 2015-09-07 DIAGNOSIS — I2510 Atherosclerotic heart disease of native coronary artery without angina pectoris: Secondary | ICD-10-CM | POA: Diagnosis present

## 2015-09-07 DIAGNOSIS — Z880 Allergy status to penicillin: Secondary | ICD-10-CM

## 2015-09-07 DIAGNOSIS — I483 Typical atrial flutter: Secondary | ICD-10-CM | POA: Diagnosis present

## 2015-09-07 DIAGNOSIS — I248 Other forms of acute ischemic heart disease: Secondary | ICD-10-CM | POA: Diagnosis present

## 2015-09-07 DIAGNOSIS — R509 Fever, unspecified: Secondary | ICD-10-CM

## 2015-09-07 DIAGNOSIS — Z7984 Long term (current) use of oral hypoglycemic drugs: Secondary | ICD-10-CM

## 2015-09-07 DIAGNOSIS — D649 Anemia, unspecified: Secondary | ICD-10-CM | POA: Diagnosis present

## 2015-09-07 DIAGNOSIS — M353 Polymyalgia rheumatica: Secondary | ICD-10-CM | POA: Diagnosis present

## 2015-09-07 DIAGNOSIS — N183 Chronic kidney disease, stage 3 unspecified: Secondary | ICD-10-CM | POA: Diagnosis present

## 2015-09-07 DIAGNOSIS — Z79899 Other long term (current) drug therapy: Secondary | ICD-10-CM

## 2015-09-07 DIAGNOSIS — Z881 Allergy status to other antibiotic agents status: Secondary | ICD-10-CM

## 2015-09-07 DIAGNOSIS — E1122 Type 2 diabetes mellitus with diabetic chronic kidney disease: Secondary | ICD-10-CM | POA: Diagnosis present

## 2015-09-07 DIAGNOSIS — Z7952 Long term (current) use of systemic steroids: Secondary | ICD-10-CM | POA: Diagnosis not present

## 2015-09-07 DIAGNOSIS — I130 Hypertensive heart and chronic kidney disease with heart failure and stage 1 through stage 4 chronic kidney disease, or unspecified chronic kidney disease: Secondary | ICD-10-CM | POA: Diagnosis present

## 2015-09-07 DIAGNOSIS — E2740 Unspecified adrenocortical insufficiency: Secondary | ICD-10-CM | POA: Diagnosis present

## 2015-09-07 DIAGNOSIS — I4891 Unspecified atrial fibrillation: Secondary | ICD-10-CM | POA: Diagnosis not present

## 2015-09-07 DIAGNOSIS — E876 Hypokalemia: Secondary | ICD-10-CM | POA: Diagnosis not present

## 2015-09-07 DIAGNOSIS — E119 Type 2 diabetes mellitus without complications: Secondary | ICD-10-CM

## 2015-09-07 DIAGNOSIS — R0789 Other chest pain: Secondary | ICD-10-CM | POA: Diagnosis not present

## 2015-09-07 DIAGNOSIS — Z833 Family history of diabetes mellitus: Secondary | ICD-10-CM | POA: Diagnosis not present

## 2015-09-07 DIAGNOSIS — K8050 Calculus of bile duct without cholangitis or cholecystitis without obstruction: Secondary | ICD-10-CM | POA: Diagnosis present

## 2015-09-07 DIAGNOSIS — D62 Acute posthemorrhagic anemia: Secondary | ICD-10-CM | POA: Insufficient documentation

## 2015-09-07 DIAGNOSIS — K219 Gastro-esophageal reflux disease without esophagitis: Secondary | ICD-10-CM | POA: Diagnosis present

## 2015-09-07 DIAGNOSIS — Z87891 Personal history of nicotine dependence: Secondary | ICD-10-CM | POA: Diagnosis not present

## 2015-09-07 DIAGNOSIS — I252 Old myocardial infarction: Secondary | ICD-10-CM | POA: Diagnosis not present

## 2015-09-07 DIAGNOSIS — M069 Rheumatoid arthritis, unspecified: Secondary | ICD-10-CM | POA: Diagnosis present

## 2015-09-07 DIAGNOSIS — R7989 Other specified abnormal findings of blood chemistry: Secondary | ICD-10-CM | POA: Diagnosis not present

## 2015-09-07 DIAGNOSIS — Z7901 Long term (current) use of anticoagulants: Secondary | ICD-10-CM | POA: Diagnosis not present

## 2015-09-07 DIAGNOSIS — I480 Paroxysmal atrial fibrillation: Secondary | ICD-10-CM | POA: Diagnosis present

## 2015-09-07 DIAGNOSIS — R946 Abnormal results of thyroid function studies: Secondary | ICD-10-CM | POA: Insufficient documentation

## 2015-09-07 DIAGNOSIS — K30 Functional dyspepsia: Secondary | ICD-10-CM | POA: Diagnosis present

## 2015-09-07 DIAGNOSIS — Z888 Allergy status to other drugs, medicaments and biological substances status: Secondary | ICD-10-CM | POA: Diagnosis not present

## 2015-09-07 DIAGNOSIS — I5032 Chronic diastolic (congestive) heart failure: Secondary | ICD-10-CM | POA: Diagnosis present

## 2015-09-07 DIAGNOSIS — I350 Nonrheumatic aortic (valve) stenosis: Secondary | ICD-10-CM | POA: Diagnosis present

## 2015-09-07 DIAGNOSIS — T501X5A Adverse effect of loop [high-ceiling] diuretics, initial encounter: Secondary | ICD-10-CM | POA: Diagnosis not present

## 2015-09-07 DIAGNOSIS — G9340 Encephalopathy, unspecified: Secondary | ICD-10-CM | POA: Diagnosis not present

## 2015-09-07 DIAGNOSIS — R778 Other specified abnormalities of plasma proteins: Secondary | ICD-10-CM | POA: Diagnosis present

## 2015-09-07 DIAGNOSIS — K5901 Slow transit constipation: Secondary | ICD-10-CM | POA: Diagnosis present

## 2015-09-07 DIAGNOSIS — K5900 Constipation, unspecified: Secondary | ICD-10-CM

## 2015-09-07 DIAGNOSIS — E785 Hyperlipidemia, unspecified: Secondary | ICD-10-CM | POA: Diagnosis present

## 2015-09-07 DIAGNOSIS — E118 Type 2 diabetes mellitus with unspecified complications: Secondary | ICD-10-CM

## 2015-09-07 DIAGNOSIS — I951 Orthostatic hypotension: Secondary | ICD-10-CM | POA: Diagnosis present

## 2015-09-07 LAB — CBC WITH DIFFERENTIAL/PLATELET
BASOS ABS: 0 10*3/uL (ref 0.0–0.1)
BASOS PCT: 0 %
EOS ABS: 0 10*3/uL (ref 0.0–0.7)
Eosinophils Relative: 0 %
HEMATOCRIT: 41.4 % (ref 39.0–52.0)
Hemoglobin: 13.3 g/dL (ref 13.0–17.0)
LYMPHS PCT: 20 %
Lymphs Abs: 1.4 10*3/uL (ref 0.7–4.0)
MCH: 31.2 pg (ref 26.0–34.0)
MCHC: 32.1 g/dL (ref 30.0–36.0)
MCV: 97.2 fL (ref 78.0–100.0)
MONO ABS: 0.9 10*3/uL (ref 0.1–1.0)
MONOS PCT: 13 %
Neutro Abs: 4.5 10*3/uL (ref 1.7–7.7)
Neutrophils Relative %: 66 %
Platelets: 154 10*3/uL (ref 150–400)
RBC: 4.26 MIL/uL (ref 4.22–5.81)
RDW: 16.2 % — AB (ref 11.5–15.5)
WBC: 6.7 10*3/uL (ref 4.0–10.5)

## 2015-09-07 LAB — HEMOGLOBIN A1C
Hgb A1c MFr Bld: 6.8 % — ABNORMAL HIGH (ref 4.8–5.6)
Mean Plasma Glucose: 148 mg/dL

## 2015-09-07 LAB — BASIC METABOLIC PANEL
ANION GAP: 9 (ref 5–15)
BUN: 15 mg/dL (ref 6–20)
CALCIUM: 8.1 mg/dL — AB (ref 8.9–10.3)
CO2: 21 mmol/L — ABNORMAL LOW (ref 22–32)
Chloride: 105 mmol/L (ref 101–111)
Creatinine, Ser: 1.66 mg/dL — ABNORMAL HIGH (ref 0.61–1.24)
GFR calc Af Amer: 45 mL/min — ABNORMAL LOW (ref >60)
GFR, EST NON AFRICAN AMERICAN: 38 mL/min — AB (ref >60)
GLUCOSE: 199 mg/dL — AB (ref 65–99)
POTASSIUM: 4.4 mmol/L (ref 3.5–5.1)
SODIUM: 135 mmol/L (ref 135–145)

## 2015-09-07 LAB — URINALYSIS, ROUTINE W REFLEX MICROSCOPIC
Bilirubin Urine: NEGATIVE
GLUCOSE, UA: NEGATIVE mg/dL
Hgb urine dipstick: NEGATIVE
Ketones, ur: NEGATIVE mg/dL
LEUKOCYTES UA: NEGATIVE
NITRITE: NEGATIVE
PH: 5.5 (ref 5.0–8.0)
Protein, ur: NEGATIVE mg/dL
SPECIFIC GRAVITY, URINE: 1.011 (ref 1.005–1.030)

## 2015-09-07 LAB — I-STAT ARTERIAL BLOOD GAS, ED
Acid-base deficit: 7 mmol/L — ABNORMAL HIGH (ref 0.0–2.0)
Bicarbonate: 17.4 mEq/L — ABNORMAL LOW (ref 20.0–24.0)
O2 Saturation: 92 %
PCO2 ART: 31.9 mmHg — AB (ref 35.0–45.0)
PH ART: 7.35 (ref 7.350–7.450)
TCO2: 18 mmol/L (ref 0–100)
pO2, Arterial: 69 mmHg — ABNORMAL LOW (ref 80.0–100.0)

## 2015-09-07 LAB — COMPREHENSIVE METABOLIC PANEL
ALBUMIN: 2.3 g/dL — AB (ref 3.5–5.0)
ALK PHOS: 50 U/L (ref 38–126)
ALT: 17 U/L (ref 17–63)
AST: 16 U/L (ref 15–41)
Anion gap: 8 (ref 5–15)
BILIRUBIN TOTAL: 0.9 mg/dL (ref 0.3–1.2)
BUN: 16 mg/dL (ref 6–20)
CALCIUM: 7.8 mg/dL — AB (ref 8.9–10.3)
CO2: 18 mmol/L — ABNORMAL LOW (ref 22–32)
CREATININE: 1.62 mg/dL — AB (ref 0.61–1.24)
Chloride: 108 mmol/L (ref 101–111)
GFR calc Af Amer: 46 mL/min — ABNORMAL LOW (ref >60)
GFR calc non Af Amer: 40 mL/min — ABNORMAL LOW (ref >60)
GLUCOSE: 140 mg/dL — AB (ref 65–99)
Potassium: 4.3 mmol/L (ref 3.5–5.1)
Sodium: 134 mmol/L — ABNORMAL LOW (ref 135–145)
TOTAL PROTEIN: 5.3 g/dL — AB (ref 6.5–8.1)

## 2015-09-07 LAB — I-STAT CG4 LACTIC ACID, ED
Lactic Acid, Venous: 0.96 mmol/L (ref 0.5–2.0)
Lactic Acid, Venous: 1.29 mmol/L (ref 0.5–2.0)

## 2015-09-07 LAB — LIPASE, BLOOD: Lipase: 26 U/L (ref 11–51)

## 2015-09-07 LAB — HEPATIC FUNCTION PANEL
ALT: 19 U/L (ref 17–63)
AST: 16 U/L (ref 15–41)
Albumin: 2.5 g/dL — ABNORMAL LOW (ref 3.5–5.0)
Alkaline Phosphatase: 55 U/L (ref 38–126)
BILIRUBIN DIRECT: 0.2 mg/dL (ref 0.1–0.5)
BILIRUBIN INDIRECT: 0.7 mg/dL (ref 0.3–0.9)
BILIRUBIN TOTAL: 0.9 mg/dL (ref 0.3–1.2)
Total Protein: 5.8 g/dL — ABNORMAL LOW (ref 6.5–8.1)

## 2015-09-07 LAB — CBC
HEMATOCRIT: 32.5 % — AB (ref 39.0–52.0)
Hemoglobin: 10.1 g/dL — ABNORMAL LOW (ref 13.0–17.0)
MCH: 30.5 pg (ref 26.0–34.0)
MCHC: 31.1 g/dL (ref 30.0–36.0)
MCV: 98.2 fL (ref 78.0–100.0)
PLATELETS: 247 10*3/uL (ref 150–400)
RBC: 3.31 MIL/uL — ABNORMAL LOW (ref 4.22–5.81)
RDW: 16.1 % — AB (ref 11.5–15.5)
WBC: 9.8 10*3/uL (ref 4.0–10.5)

## 2015-09-07 LAB — CBG MONITORING, ED
GLUCOSE-CAPILLARY: 121 mg/dL — AB (ref 65–99)
GLUCOSE-CAPILLARY: 99 mg/dL (ref 65–99)

## 2015-09-07 LAB — PROCALCITONIN: Procalcitonin: 0.49 ng/mL

## 2015-09-07 LAB — PROTIME-INR
INR: 1.25 (ref 0.00–1.49)
PROTHROMBIN TIME: 15.8 s — AB (ref 11.6–15.2)

## 2015-09-07 LAB — TROPONIN I
TROPONIN I: 0.04 ng/mL — AB (ref <0.031)
Troponin I: 0.04 ng/mL — ABNORMAL HIGH (ref <0.031)

## 2015-09-07 LAB — CORTISOL: Cortisol, Plasma: 14.4 ug/dL

## 2015-09-07 LAB — MAGNESIUM: MAGNESIUM: 1.4 mg/dL — AB (ref 1.7–2.4)

## 2015-09-07 MED ORDER — SODIUM CHLORIDE 0.9 % IV BOLUS (SEPSIS)
500.0000 mL | INTRAVENOUS | Status: AC
  Administered 2015-09-07: 500 mL via INTRAVENOUS

## 2015-09-07 MED ORDER — DEXTROSE 5 % IV SOLN: 2.0000 g | Freq: Once | INTRAVENOUS | Status: DC

## 2015-09-07 MED ORDER — VANCOMYCIN HCL 10 G IV SOLR
1500.0000 mg | Freq: Once | INTRAVENOUS | Status: AC
  Administered 2015-09-07: 1500 mg via INTRAVENOUS
  Filled 2015-09-07: qty 1500

## 2015-09-07 MED ORDER — INSULIN ASPART 100 UNIT/ML ~~LOC~~ SOLN
0.0000 [IU] | SUBCUTANEOUS | Status: DC
  Administered 2015-09-08: 1 [IU] via SUBCUTANEOUS
  Administered 2015-09-08: 5 [IU] via SUBCUTANEOUS
  Administered 2015-09-09 (×2): 2 [IU] via SUBCUTANEOUS
  Filled 2015-09-07: qty 1

## 2015-09-07 MED ORDER — LEVOFLOXACIN IN D5W 750 MG/150ML IV SOLN: 750.0000 mg | INTRAVENOUS | Status: DC

## 2015-09-07 MED ORDER — SODIUM CHLORIDE 0.9 % IV SOLN
INTRAVENOUS | Status: DC
  Administered 2015-09-07 – 2015-09-11 (×5): via INTRAVENOUS

## 2015-09-07 MED ORDER — ACETAMINOPHEN 325 MG PO TABS
650.0000 mg | ORAL_TABLET | Freq: Once | ORAL | Status: AC
  Administered 2015-09-07: 650 mg via ORAL
  Filled 2015-09-07: qty 2

## 2015-09-07 MED ORDER — GLIPIZIDE ER 5 MG PO TB24
5.0000 mg | ORAL_TABLET | Freq: Every day | ORAL | Status: DC
  Filled 2015-09-07: qty 1

## 2015-09-07 MED ORDER — ACETAMINOPHEN 500 MG PO TABS
1000.0000 mg | ORAL_TABLET | Freq: Once | ORAL | Status: AC
  Administered 2015-09-07: 1000 mg via ORAL
  Filled 2015-09-07: qty 2

## 2015-09-07 MED ORDER — PANTOPRAZOLE SODIUM 40 MG IV SOLR
40.0000 mg | Freq: Once | INTRAVENOUS | Status: AC
  Administered 2015-09-07: 40 mg via INTRAVENOUS
  Filled 2015-09-07: qty 40

## 2015-09-07 MED ORDER — SODIUM CHLORIDE 0.9 % IV BOLUS (SEPSIS)
1000.0000 mL | INTRAVENOUS | Status: AC
  Administered 2015-09-07 (×2): 1000 mL via INTRAVENOUS

## 2015-09-07 MED ORDER — LEVOFLOXACIN IN D5W 750 MG/150ML IV SOLN
750.0000 mg | Freq: Once | INTRAVENOUS | Status: AC
  Administered 2015-09-07: 750 mg via INTRAVENOUS
  Filled 2015-09-07: qty 150

## 2015-09-07 MED ORDER — ENOXAPARIN SODIUM 40 MG/0.4ML ~~LOC~~ SOLN
40.0000 mg | Freq: Every day | SUBCUTANEOUS | Status: DC
  Administered 2015-09-08 – 2015-09-09 (×2): 40 mg via SUBCUTANEOUS
  Filled 2015-09-07 (×4): qty 0.4

## 2015-09-07 MED ORDER — VANCOMYCIN HCL IN DEXTROSE 1-5 GM/200ML-% IV SOLN: 1000.0000 mg | Freq: Once | INTRAVENOUS | Status: DC

## 2015-09-07 MED ORDER — VANCOMYCIN HCL IN DEXTROSE 1-5 GM/200ML-% IV SOLN: 1000.0000 mg | INTRAVENOUS | Status: DC

## 2015-09-07 MED ORDER — AMIODARONE HCL 200 MG PO TABS
200.0000 mg | ORAL_TABLET | Freq: Every day | ORAL | Status: DC
  Administered 2015-09-07 – 2015-09-09 (×3): 200 mg via ORAL
  Filled 2015-09-07 (×5): qty 1

## 2015-09-07 MED ORDER — DEXTROSE 5 % IV SOLN
1.0000 g | Freq: Three times a day (TID) | INTRAVENOUS | Status: DC
  Filled 2015-09-07 (×2): qty 1

## 2015-09-07 MED ORDER — HYDROCORTISONE NA SUCCINATE PF 100 MG IJ SOLR
50.0000 mg | Freq: Three times a day (TID) | INTRAMUSCULAR | Status: DC
  Administered 2015-09-07 – 2015-09-08 (×2): 50 mg via INTRAVENOUS
  Filled 2015-09-07 (×2): qty 2

## 2015-09-07 MED ORDER — TRAMADOL HCL 50 MG PO TABS
50.0000 mg | ORAL_TABLET | Freq: Once | ORAL | Status: AC
  Administered 2015-09-07: 50 mg via ORAL
  Filled 2015-09-07: qty 1

## 2015-09-07 NOTE — ED Notes (Signed)
Hospital bed requested.

## 2015-09-07 NOTE — ED Provider Notes (Signed)
CSN: [id number]     Arrival date & time 09/07/15  0941 History   First MD Initiated Contact with Patient 09/07/15 [phone]     Chief Complaint  Patient presents with  . Chest Pain     (Consider location/radiation/quality/duration/timing/severity/associated sxs/prior Treatment) HPI Recurrence of thoracic chest pain. Shortness of breath. Occasional nausea. Patient was recently discharged from hospital. He was seen again on Sunday with diagnostic workup without specific etiology. Past Medical History  Diagnosis Date  . Other and unspecified hyperlipidemia   . Coronary atherosclerosis of unspecified type of vessel, native or graft   . Personal history of colonic polyps   . Actinic keratosis   . 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 Other psoriasis   . Scoliosis (and kyphoscoliosis), idiopathic   . GI bleed 12/10    cecam AVM  . Gastritis 12/10  . Hyperlipidemia     takes Pravastatin daily  . Hypertension     takes Metoprolol daily  . Shortness of breath     with exertion  . Headache(784.0)     occasionally  . Joint pain   . Joint swelling   . Chronic back pain     scoliosis/stenosis/radiculopathy,degenerative disc disease  . Psoriasis   . Bruises easily   . Esophageal reflux     takes Omeprazole daily  . History of colonic polyps   . Hemorrhoids   . Kidney stone     hx of  . Blood transfusion 2010  . PMR (polymyalgia rheumatica) (HCC) 01/20/2012    tx by specialist on a long prednisone taper  . S/P PTCA (percutaneous transluminal coronary angioplasty)   . Rheumatoid arthritis(714.0)     takes Methotrexate 7pills weekly  . Degeneration of intervertebral disc, site unspecified   . Myocardial infarction ([place]) 2010  . Pneumonia 2008; 07/2015  . Type II or unspecified type diabetes mellitus without mention of complication, not stated as uncontrolled   . Skin cancer     "arms"  . CHF (congestive heart failure) [facility])    Past Surgical History  Procedure Laterality Date  . Rotator  cuff repair Left 2000  . Colonoscopy    . Vasectomy  1979  . Coronary angioplasty with stent placement  02/2009    has one stent  . Carotid doppler  10/12    0-39% R and 60-79% left   . Cataract extraction  2012  . Cardiac catheterization  2010  . Lithotripsy  2009  . Tee without cardioversion N/A 08/06/2015    Procedure: TRANSESOPHAGEAL ECHOCARDIOGRAM (TEE);  Surgeon: Skeet Latch, MD;  Location: [place];  Service: Cardiovascular;  Laterality: N/A;  . Cardioversion N/A 08/06/2015    Procedure: CARDIOVERSION;  Surgeon: Skeet Latch, MD;  Location: [facility] ENDOSCOPY;  Service: Cardiovascular;  Laterality: N/A;  . Back surgery    . Lumbar laminectomy  07/2015  . Posterior laminectomy thoracic spine      "fixed the discs"  . Skin cancer excision      "arms"   Family History  Problem Relation Age of Onset  . Coronary artery disease Mother   . Diabetes Mother   . CAD Mother   . Coronary artery disease Sister   . Hypertension Sister   . Kidney failure Sister   . CAD Sister   . Diabetes Brother   . Prostate cancer Brother   . Pancreatic cancer Brother   . Diabetes Brother   . Anesthesia problems Neg Hx   . Hypotension Neg Hx   . Malignant  hyperthermia Neg Hx   . Pseudochol deficiency Neg Hx    Social History  Substance Use Topics  . Smoking status: Former Smoker -- 1.50 packs/day for 53 years    Types: Cigarettes  . Smokeless tobacco: Never Used     Comment: quit smoking cigarettes in 2010  . Alcohol Use: 0.0 oz/week    0 Standard drinks or equivalent per week     Comment: 08/11/2015 "might have 1-2 margaritas/month in the summertime"    Review of Systems   10 Systems reviewed and are negative for acute change except as noted in the HPI.  Allergies  Ace inhibitors; Angiotensin receptor blockers; Metoprolol; Penicillins; and Tetracycline  Home Medications   Prior to Admission medications   Medication Sig Start Date End Date Taking? Authorizing Provider   acetaminophen (TYLENOL) 325 MG tablet Take 650 mg by mouth every 6 (six) hours as needed (Pain scale of 1-4 or temp > 101).   Yes Historical Provider, MD  amiodarone (PACERONE) 400 MG tablet Take 0.5 tablets (200 mg total) by mouth daily. 09/06/15  Yes Domenic Polite, MD  calcium-vitamin D (OSCAL WITH D) 500-200 MG-UNIT tablet Take 1 tablet by mouth daily with breakfast.   Yes Historical Provider, MD  ferrous sulfate 325 (65 FE) MG tablet Take 1 tablet (325 mg total) by mouth 3 (three) times daily with meals. 09/02/15  Yes Ivan Anchors Love, PA-C  glipiZIDE (GLUCOTROL) 5 MG tablet Take 1 tablet (5 mg total) by mouth daily before breakfast. 09/02/15  Yes Ivan Anchors Love, PA-C  pantoprazole (PROTONIX) 40 MG tablet Take 1 tablet (40 mg total) by mouth daily. 09/02/15  Yes Ivan Anchors Love, PA-C  predniSONE (DELTASONE) 10 MG tablet Take 10 mg by mouth daily with breakfast.    Yes Historical Provider, MD  thiamine 100 MG tablet Take 1 tablet (100 mg total) by mouth daily. 09/02/15  Yes Ivan Anchors Love, PA-C  warfarin (COUMADIN) 5 MG tablet Take 2 tablets (10 mg total) by mouth daily at 6 PM. Take 10mg  for 2days then resume 5mg  daily, please have INR check in 3-4days 09/06/15  Yes Domenic Polite, MD  senna (SENOKOT) 8.6 MG TABS tablet Take 2 tablets (17.2 mg total) by mouth daily. Patient not taking: Reported on 09/07/2015 09/02/15   Ivan Anchors Love, PA-C   BP 87/61 mmHg  Pulse 106  Temp(Src) 100.8 F (38.2 C) (Rectal)  Resp 25  Wt 171 lb (77.565 kg)  SpO2 94% Physical Exam  Constitutional: He is oriented to person, place, and time. He appears well-developed and well-nourished.  HENT:  Head: Normocephalic and atraumatic.  Eyes: EOM are normal. Pupils are equal, round, and reactive to light.  Neck: Neck supple.  Cardiovascular: Intact distal pulses.   Irregularly irregular  Pulmonary/Chest: Effort normal and breath sounds normal.  Abdominal: Soft. Bowel sounds are normal. He exhibits no distension. There is no  tenderness.  Musculoskeletal: Normal range of motion. He exhibits no edema.  Neurological: He is alert and oriented to person, place, and time. He has normal strength. Coordination normal. GCS eye subscore is 4. GCS verbal subscore is 5. GCS motor subscore is 6.  Skin: Skin is warm, dry and intact.  Psychiatric: He has a normal mood and affect.    ED Course  Procedures (including critical care time) CRITICAL CARE Performed by: Charlesetta Shanks   Total critical care time:45 minutes  Critical care time was exclusive of separately billable procedures and treating other patients.  Critical care was necessary  to treat or prevent imminent or life-threatening deterioration.  Critical care was time spent personally by me on the following activities: development of treatment plan with patient and/or surrogate as well as nursing, discussions with consultants, evaluation of patient's response to treatment, examination of patient, obtaining history from patient or surrogate, ordering and performing treatments and interventions, ordering and review of laboratory studies, ordering and review of radiographic studies, pulse oximetry and re-evaluation of patient's condition. Labs Review Labs Reviewed  BASIC METABOLIC PANEL - Abnormal; Notable for the following:    CO2 21 (*)    Glucose, Bld 199 (*)    Creatinine, Ser 1.66 (*)    Calcium 8.1 (*)    GFR calc non Af Amer 38 (*)    GFR calc Af Amer 45 (*)    All other components within normal limits  TROPONIN I - Abnormal; Notable for the following:    Troponin I 0.04 (*)    All other components within normal limits  CBC - Abnormal; Notable for the following:    RBC 3.31 (*)    Hemoglobin 10.1 (*)    HCT 32.5 (*)    RDW 16.1 (*)    All other components within normal limits  HEPATIC FUNCTION PANEL - Abnormal; Notable for the following:    Total Protein 5.8 (*)    Albumin 2.5 (*)    All other components within normal limits  PROTIME-INR -  Abnormal; Notable for the following:    Prothrombin Time 15.8 (*)    All other components within normal limits  TROPONIN I - Abnormal; Notable for the following:    Troponin I 0.04 (*)    All other components within normal limits  COMPREHENSIVE METABOLIC PANEL - Abnormal; Notable for the following:    Sodium 134 (*)    CO2 18 (*)    Glucose, Bld 140 (*)    Creatinine, Ser 1.62 (*)    Calcium 7.8 (*)    Total Protein 5.3 (*)    Albumin 2.3 (*)    GFR calc non Af Amer 40 (*)    GFR calc Af Amer 46 (*)    All other components within normal limits  CBC WITH DIFFERENTIAL/PLATELET - Abnormal; Notable for the following:    RDW 16.2 (*)    All other components within normal limits  I-STAT ARTERIAL BLOOD GAS, ED - Abnormal; Notable for the following:    pCO2 arterial 31.9 (*)    pO2, Arterial 69.0 (*)    Bicarbonate 17.4 (*)    Acid-base deficit 7.0 (*)    All other components within normal limits  CULTURE, BLOOD (ROUTINE X 2)  CULTURE, BLOOD (ROUTINE X 2)  URINE CULTURE  LIPASE, BLOOD  URINALYSIS, ROUTINE W REFLEX MICROSCOPIC (NOT AT [facility])  PROCALCITONIN  MAGNESIUM  HEMOGLOBIN A1C  I-STAT CG4 LACTIC ACID, ED  I-STAT CG4 LACTIC ACID, ED    Imaging Review Dg Chest 2 View  09/07/2015  CLINICAL DATA:  Chest pain EXAM: CHEST  2 VIEW COMPARISON:  09/05/2015 FINDINGS: Cardiac shadow is stable. The lungs are well aerated bilaterally. No focal infiltrate is noted. No sizable effusion is seen. Mild interstitial changes are noted stable from the previous exam. Postsurgical changes are noted. Mid thoracic compression deformity is again noted and stable. IMPRESSION: No acute abnormality noted. Electronically Signed   By: Inez Catalina M.D.   On: 09/07/2015 10:39   Dg Chest 2 View  09/05/2015  CLINICAL DATA:  Acute onset of mid chest pain,  radiating to the back. Initial encounter. EXAM: CHEST  2 VIEW COMPARISON:  Chest radiograph from 08/17/2015 FINDINGS: The lungs are well-aerated. Peribronchial  thickening is noted, with mild bilateral atelectasis or scarring. There is no evidence of pleural effusion or pneumothorax. The heart is borderline normal in size. No acute osseous abnormalities are seen. Thoracolumbar spinal fusion hardware is noted. Chronic compression deformities are noted at the mid thoracic spine. IMPRESSION: Peribronchial thickening, with mild bilateral atelectasis or scarring. Lungs otherwise grossly clear. Electronically Signed   By: Garald Balding M.D.   On: 09/05/2015 21:19   US Abdomen Limited  09/07/2015  CLINICAL DATA:  Right upper quadrant abdominal pain since last Friday. EXAM: US ABDOMEN LIMITED - RIGHT UPPER QUADRANT COMPARISON:  None. FINDINGS: Gallbladder: Multiple echogenic shadowing gallstones. The largest measures 9 mm. No gallbladder wall thickening, pericholecystic fluid or sonographic Murphy sign to suggest acute cholecystitis. Common bile duct: Diameter: 4.0 mm Liver: Normal echogenicity without focal lesion or biliary dilatation. IMPRESSION: Cholelithiasis without sonographic findings for acute cholecystitis. Normal caliber common bile duct and normal liver. Electronically Signed   By: Marijo Sanes M.D.   On: 09/07/2015 13:51   Ct Angio Chest Aorta W/cm &/or Wo/cm  09/06/2015  CLINICAL DATA:  Chest pain radiating to the back beginning at 1600 hours. Similar symptoms September 04, 2015. History of polymyalgia rheumatica, diabetes, CHF. EXAM: CT ANGIOGRAPHY CHEST WITH AND WITHOUT CONTRAST TECHNIQUE: Multidetector CT imaging of the chest was performed using the standard protocol during bolus administration of intravenous contrast. Multiplanar CT image reconstructions and MIPs were obtained to evaluate the vascular anatomy. CONTRAST:  43mL OMNIPAQUE IOHEXOL 350 MG/ML SOLN COMPARISON:  CT chest July 26, 2015 and chest radiograph September 05, 2015 FINDINGS: MEDIASTINUM: The heart is mildly enlarged, similar to prior examination. Small residual pericardial effusion. Moderate  coronary artery calcifications. Noncontrast CT of the thoracic aorta shows no abnormal density. Postcontrast imaging of the aorta shows homogeneous contrast opacification, normal in course and caliber with moderate calcific atherosclerosis and and intimal thickening. No aneurysm, dissection, suspicious luminal irregularity, periaortic fluid collections or contrast extravasation. High-grade stenosis LEFT V1 segment due to atherosclerosis, patent arch vessels. Scattered prominent though not pathologically enlarged mediastinal lymph nodes measure up to 9 mm, previously 11 mm short axis. LUNGS: Tracheobronchial tree is patent, no pneumothorax. Complete resolution of pleural effusions. Dependent atelectasis and/or scarring. RIGHT upper lobe atelectasis. No focal consolidation. Interval extubation. SOFT TISSUES AND OSSEOUS STRUCTURES: Thoracolumbar PLIF. Osteopenia stable mild T5, moderate to severe to T7, moderate T8 mild T9 compression fractures. Multiple small gallstones. Colonic diverticulosis partially imaged. 13 mm exophytic RIGHT interpolar cyst. Multiple thyroid nodules measure up to 14 mm, which do not reach size criteria for recommended follow-up. Review of the MIP images confirms the above findings. IMPRESSION: No acute vascular process. Moderate atherosclerosis and high-grade stenosis LEFT vertebral artery origin. Stable cardiomegaly, small residual pericardial effusion. Resolution of pleural effusions. No acute pulmonary process. Electronically Signed   By: Elon Alas M.D.   On: 09/06/2015 02:04   I have personally reviewed and evaluated these images and lab results as part of my medical decision-making.   EKG Interpretation None     Consult: triad hospitalist for admission Consult: Gen. surgery. Recommend HIDA. Will consult at abnormality identified. MDM   Final diagnoses:  Chest pain, unspecified chest pain type  Biliary colic  Paroxysmal atrial fibrillation (HCC)  Hypotension,  unspecified hypotension type  Fever, unspecified fever cause   Patient presents with chest pain back/thoracic pain  has been persistent. He has developed fever. He has had hypotension waxing and waning. Initially suspected secondary to nitroglycerin administered by EMS. However, after fluid administration patient did get return of hypotension and fever to 100.8. At this point sepsis protocol was initiated without specific source. He did have ultrasound shows gallstones but no cholecystitis and no abnormality in his LFTs. CT scan of the chest was done on Sunday which ruled out for thoracic dissection or other specific etiology. Repeat chest x-ray does not show focal pneumonia. A mental status remains stable. He did get improvement of pain with tramadol and protonix.    Charlesetta Shanks, MD 09/07/15 [phone]

## 2015-09-07 NOTE — Telephone Encounter (Signed)
Patient Name: Curtis Frazier  DOB: [birth year]    Initial Comment Caller states he's having chest pain.    Nurse Assessment  Nurse: Julien Girt, RN, Almyra Free Date/Time Eilene Ghazi Time): 09/07/2015 8:17:59 AM  Confirm and document reason for call. If symptomatic, describe symptoms. You must click the next button to save text entered. ---Caller states he is having chest pain and sounds sob. States the pain began last night, has kept him up throughout the night and is present now. His pain is worse this morning. States he was in the ED Sunday night for this pain, no new dx.  Has the patient traveled out of the country within the last 30 days? ---Not Applicable  Does the patient have any new or worsening symptoms? ---Yes  Will a triage be completed? ---Yes  Related visit to physician within the last 2 weeks? ---Yes  Does the PT have any chronic conditions? (i.e. diabetes, asthma, etc.) ---Yes  List chronic conditions. ---Back surgery /post-op infection, RA, GERD, Hx Cardiac Cath/Cardioversion/MI/CHF, Htn, High cholesterol  Is this a behavioral health or substance abuse call? ---No     Guidelines    Guideline Title Affirmed Question Affirmed Notes  Chest Pain [1] Chest pain lasts > 5 minutes AND [2] history of heart disease (i.e., heart attack, bypass surgery, angina, angioplasty, CHF; not just a heart murmur)    Final Disposition User   Call EMS 911 Now Julien Girt, RN, Almyra Free    Disagree/Comply: Comply

## 2015-09-07 NOTE — Telephone Encounter (Signed)
He is in the ED now.  

## 2015-09-07 NOTE — H&P (Signed)
[facility] History and Physical                                                                                    Curtis Frazier, is a 75 y.o. male  MRN: [id number]   DOB - 25-Oct-1940  Admit Date - 09/07/2015  Outpatient Primary MD for the patient is Loura Pardon, MD  Referring MD: Johnney Killian / ER  Consulting MD: General Surgery (paged by EDP)  PMH: Past Medical History  Diagnosis Date  . Other and unspecified hyperlipidemia   . Coronary atherosclerosis of unspecified type of vessel, native or graft   . Personal history of colonic polyps   . Actinic keratosis   . Psoriatic arthropathy ([place])   . Other psoriasis   . Scoliosis (and kyphoscoliosis), idiopathic   . GI bleed 12/10    cecam AVM  . Gastritis 12/10  . Hyperlipidemia     takes Pravastatin daily  . Hypertension     takes Metoprolol daily  . Shortness of breath     with exertion  . Headache(784.0)     occasionally  . Joint pain   . Joint swelling   . Chronic back pain     scoliosis/stenosis/radiculopathy,degenerative disc disease  . Psoriasis   . Bruises easily   . Esophageal reflux     takes Omeprazole daily  . History of colonic polyps   . Hemorrhoids   . Kidney stone     hx of  . Blood transfusion 2010  . PMR (polymyalgia rheumatica) (HCC) 01/20/2012    tx by specialist on a long prednisone taper  . S/P PTCA (percutaneous transluminal coronary angioplasty)   . Rheumatoid arthritis(714.0)     takes Methotrexate 7pills weekly  . Degeneration of intervertebral disc, site unspecified   . Myocardial infarction ([place]) 2010  . Pneumonia 2008; 07/2015  . Type II or unspecified type diabetes mellitus without mention of complication, not stated as uncontrolled   . Skin cancer     "arms"  . CHF (congestive heart failure) (HCC)       PSH: Past Surgical History  Procedure Laterality Date  . Rotator cuff repair Left 2000  . Colonoscopy    . Vasectomy  1979  . Coronary angioplasty with stent placement   02/2009    has one stent  . Carotid doppler  10/12    0-39% R and 60-79% left   . Cataract extraction  2012  . Cardiac catheterization  2010  . Lithotripsy  2009  . Tee without cardioversion N/A 08/06/2015    Procedure: TRANSESOPHAGEAL ECHOCARDIOGRAM (TEE);  Surgeon: Skeet Latch, MD;  Location: [place];  Service: Cardiovascular;  Laterality: N/A;  . Cardioversion N/A 08/06/2015    Procedure: CARDIOVERSION;  Surgeon: Skeet Latch, MD;  Location: [facility] ENDOSCOPY;  Service: Cardiovascular;  Laterality: N/A;  . Back surgery    . Lumbar laminectomy  07/2015  . Posterior laminectomy thoracic spine      "fixed the discs"  . Skin cancer excision      "arms"     CC:  Chief Complaint  Patient presents with  . Chest Pain     HPI: 75 year old  male patient with history of polymyalgia rheumatica on chronic steroids, diabetes on oral agents, history of atrial fibrillation on amiodarone, history of rheumatoid arthritis, lumbar stenosis with outpatient lumbar discectomy this year, aortic stenosis with associated diastolic dysfunction, Hypertension, dyslipidemia and constipation as well as nonspecified anemia. Patient was recently discharged from [facility] after a protracted hospitalization for sepsis. Hospital course was complicated by ventilator-dependent respiratory failure, acute diastolic heart exacerbation as well as A. fib with RVR requiring cardioversion. He had persistent hypoxemia and was severely deconditioned and had issues with confusion and encephalopathy. He was initially transferred to [facility] the pituitary 21st but [facility] with ongoing encephalopathy, worsening lethargy and poor oral intake. Of note he had orthostatic changes with hypotension and required stress dose steroids as well as IV fluids. He had issues with dysphagia which improved as his deconditioning improved. Unfortunately no clear-cut source of sepsis was ever  identified. His acute renal failure eventually cleared. Patient was started on warfarin during the admission. And he was discharged to home on 3/23. He was readmitted to the hospital on 3/27 for chest pain thought to be noncardiac and subsequently discharged on the same date.   He returns to the ER today on 3/28 for significant epigastric chest pain radiating up between the shoulder blades. In further clarification with the patient Saturday night into Sunday morning patient had significant indigestion with pain radiating into the back that lasted several hours through the night. He has not noticed any similar symptoms after eating. Prior to most recent hospitalizations he not had any trouble with this type of chest discomfort. He did not have any shortness of breath or diaphoresis with the chest pain. He has been constipated from iron pills and did well while receiving stool softeners in the hospital but did not continue these after discharge. Of note patient had been on prednisone 5 mg daily prior to the February admission. At time of discharge he was discharged on 10 mg with instructions to eventually decrease to 5 mg daily and to notify either his PCP or his rheumatologist if he develops "any trouble". The daughter states that since previous discharge on last Thursday 3/23 patient has not had any prednisone because he did not have a refill and did not have any pills at the house. His only other symptoms have been pleuritic chest pain with taking a deep breath without productive cough.  ER Evaluation and treatment:  Initial oral temperature 100F-BP 86/55-pulse 95-respirations 17-room air saturations 93-97% After treatment with fluid boluses in the ER patient's blood pressure increased to 103/58 but has since decreased again to 87/61 and has been maintaining in the 70W systolic-he's had minor tachycardia with rates less than 110 bpm EKG: Sinus tachycardia with frequent unifocal PVCs, ventricular rate 97  bpm, QTC 480 normally seconds, left axis deviation, nonspecified order line interventricular conduction delay, a definitive ischemic changes 2 View CXR: No acute abnormality and unchanged from previous chest x-ray Abdominal ultrasound: Cholelithiasis without sonographic findings for acute cholecystitis, common bile that normal at 4 mm, no hepatic abnormalities noted Lab data: Na 134, K 4.3, CO2 18, BUN 16, Cr 1.62, glucose 140, LFTs normal, lipase 26, troponin 0.042, lactic acid 0.96, WBC 6700 with normal differential, hemoglobin 13.3, platelets 154,000; blood cultures and urine culture has been obtained in the ER Amiodarone 200 mg by mouth 1 Protonix 40 mg IV 1 Tylenol 1000 mg by mouth 1 Tramadol 50 mg by mouth 1 Normal  saline bolus 2 L Levaquin 750 mg IV 1 Vancomycin 1000 mg IV 1 Tylenol 650 mg by mouth 1  Review of Systems   In addition to the HPI above,  No Fever, myalgias or other constitutional symptoms No Headache, changes with Vision or hearing, new weakness, tingling, numbness in any extremity, No problems swallowing food or Liquids, indigestion/reflux No Chest pain, Cough or Shortness of Breath, palpitations, orthopnea or DOE No N/V; no melena or hematochezia, no dark tarry stools No dysuria, hematuria or flank pain No new skin rashes, lesions, masses or bruises, No new joints pains-aches No recent weight gain or loss No polyuria, polydypsia or polyphagia,  *A full 10 point Review of Systems was done, except as stated above, all other Review of Systems were negative.  Social History Social History  Substance Use Topics  . Smoking status: Former Smoker -- 1.50 packs/day for 53 years    Types: Cigarettes  . Smokeless tobacco: Never Used     Comment: quit smoking cigarettes in 2010  . Alcohol Use: 0.0 oz/week    0 Standard drinks or equivalent per week     Comment: 08/11/2015 "might have 1-2 margaritas/month in the summertime"    Resides at: Private  residence  Lives with: Spouse  Ambulatory status: Without assistive devices   Family History Family History  Problem Relation Age of Onset  . Coronary artery disease Mother   . Diabetes Mother   . CAD Mother   . Coronary artery disease Sister   . Hypertension Sister   . Kidney failure Sister   . CAD Sister   . Diabetes Brother   . Prostate cancer Brother   . Pancreatic cancer Brother   . Diabetes Brother   . Anesthesia problems Neg Hx   . Hypotension Neg Hx   . Malignant hyperthermia Neg Hx   . Pseudochol deficiency Neg Hx      Prior to Admission medications   Medication Sig Start Date End Date Taking? Authorizing Provider  acetaminophen (TYLENOL) 325 MG tablet Take 650 mg by mouth every 6 (six) hours as needed (Pain scale of 1-4 or temp > 101).   Yes Historical Provider, MD  amiodarone (PACERONE) 400 MG tablet Take 0.5 tablets (200 mg total) by mouth daily. 09/06/15  Yes Domenic Polite, MD  calcium-vitamin D (OSCAL WITH D) 500-200 MG-UNIT tablet Take 1 tablet by mouth daily with breakfast.   Yes Historical Provider, MD  ferrous sulfate 325 (65 FE) MG tablet Take 1 tablet (325 mg total) by mouth 3 (three) times daily with meals. 09/02/15  Yes Ivan Anchors Love, PA-C  glipiZIDE (GLUCOTROL) 5 MG tablet Take 1 tablet (5 mg total) by mouth daily before breakfast. 09/02/15  Yes Ivan Anchors Love, PA-C  pantoprazole (PROTONIX) 40 MG tablet Take 1 tablet (40 mg total) by mouth daily. 09/02/15  Yes Ivan Anchors Love, PA-C  predniSONE (DELTASONE) 10 MG tablet Take 10 mg by mouth daily with breakfast.    Yes Historical Provider, MD  thiamine 100 MG tablet Take 1 tablet (100 mg total) by mouth daily. 09/02/15  Yes Ivan Anchors Love, PA-C  warfarin (COUMADIN) 5 MG tablet Take 2 tablets (10 mg total) by mouth daily at 6 PM. Take 23m for 2days then resume 521mdaily, please have INR check in 3-4days 09/06/15  Yes PrDomenic PoliteMD  senna (SENOKOT) 8.6 MG TABS tablet Take 2 tablets (17.2 mg total) by mouth  daily. Patient not taking: Reported on 09/07/2015 09/02/15   PaIvan Anchors  Love, PA-C    Allergies  Allergen Reactions  . Ace Inhibitors Other (See Comments)    Causes high K  . Angiotensin Receptor Blockers Other (See Comments)    Causes high K  . Metoprolol Other (See Comments)    May cause elevated K level  . Penicillins Swelling and Rash    Has patient had a PCN reaction causing immediate rash, facial/tongue/throat swelling, SOB or lightheadedness with hypotension: YES Has patient had a PCN reaction causing severe rash involving mucus membranes or skin necrosis: NO Has patient had a PCN reaction that required hospitalization NO Has patient had a PCN reaction occurring within the last 10 years: NO If all of the above answers are "NO", then may proceed with Cephalosporin use.  . Tetracycline Swelling and Rash    Physical Exam  Vitals  Blood pressure 87/61, pulse 106, temperature 100.8 F (38.2 C), temperature source Rectal, resp. rate 25, weight 171 lb (77.565 kg), SpO2 94 %.   General:  In no acute distress, appears healthy and well nourished  Psych:  Normal affect, Denies Suicidal or Homicidal ideations, Awake Alert, Oriented X 3. Speech and thought patterns are clear and appropriate, no apparent short term memory deficits  Neuro:   No focal neurological deficits, CN II through XII intact, Strength 5/5 all 4 extremities, Sensation intact all 4 extremities.  ENT:  Ears and Eyes appear Normal, Conjunctivae clear, PER. Dry oral mucosa without erythema or exudates.  Neck:  Supple, No lymphadenopathy appreciated  Respiratory:  Symmetrical chest wall movement, Good air movement bilaterally, CTAB. Room Air  Cardiac: Sinus tachycardia with frequent unifocal PVCs, systolic Murmur grade 2/6, no LE edema noted, no JVD, No carotid bruits, peripheral pulses palpable at 2+  Abdomen:  Positive bowel sounds, Soft, Non tender, Non distended,  No masses appreciated, no obvious  hepatosplenomegaly  Skin:  No Cyanosis, No Skin Rash or Bruise.  Extremities: Symmetrical without obvious trauma or injury,  no effusions.  Data Review  CBC  Recent Labs Lab 09/05/15 2136 09/07/15 1037 09/07/15 1450  WBC 9.8 9.8 6.7  HGB 11.0* 10.1* 13.3  HCT 33.8* 32.5* 41.4  PLT 256 247 154  MCV 98.8 98.2 97.2  MCH 32.2 30.5 31.2  MCHC 32.5 31.1 32.1  RDW 16.1* 16.1* 16.2*  LYMPHSABS  --   --  1.4  MONOABS  --   --  0.9  EOSABS  --   --  0.0  BASOSABS  --   --  0.0    Chemistries   Recent Labs Lab 09/01/15 0637 09/05/15 2136 09/07/15 1037 09/07/15 1450  NA 140 137 135 134*  K 5.0 4.7 4.4 4.3  CL 108 107 105 108  CO2 24 21* 21* 18*  GLUCOSE 123* 216* 199* 140*  BUN 20 21* 15 16  CREATININE 1.38* 1.82* 1.66* 1.62*  CALCIUM 8.9 8.7* 8.1* 7.8*  AST  --   --  16 16  ALT  --   --  19 17  ALKPHOS  --   --  55 50  BILITOT  --   --  0.9 0.9    estimated creatinine clearance is 37.5 mL/min (by C-G formula based on Cr of 1.62).  No results for input(s): TSH, T4TOTAL, T3FREE, THYROIDAB in the last 72 hours.  Invalid input(s): FREET3  Coagulation profile  Recent Labs Lab 09/01/15 0637 09/02/15 0445 09/06/15 0335 09/07/15 1304  INR 2.11* 2.12* 1.52* 1.25    No results for input(s): DDIMER in the last  72 hours.  Cardiac Enzymes  Recent Labs Lab 09/06/15 1525 09/07/15 1037 09/07/15 1417  TROPONINI <0.03 0.04* 0.04*    Invalid input(s): POCBNP  Urinalysis    Component Value Date/Time   COLORURINE YELLOW 08/11/2015 1406   APPEARANCEUR CLEAR 08/11/2015 1406   LABSPEC 1.012 08/11/2015 1406   PHURINE 5.5 08/11/2015 1406   GLUCOSEU NEGATIVE 08/11/2015 1406   HGBUR NEGATIVE 08/11/2015 1406   HGBUR negative 08/03/2010 0756   BILIRUBINUR NEGATIVE 08/11/2015 1406   BILIRUBINUR negative 09/25/2014 1305   KETONESUR NEGATIVE 08/11/2015 1406   PROTEINUR NEGATIVE 08/11/2015 1406   PROTEINUR 1+ 09/25/2014 1305   UROBILINOGEN negative 09/25/2014 1305    UROBILINOGEN 2.0* 07/11/2011 2021   NITRITE NEGATIVE 08/11/2015 1406   NITRITE negative 09/25/2014 1305   LEUKOCYTESUR NEGATIVE 08/11/2015 1406    Imaging results:   Dg Chest 2 View  09/07/2015  CLINICAL DATA:  Chest pain EXAM: CHEST  2 VIEW COMPARISON:  09/05/2015 FINDINGS: Cardiac shadow is stable. The lungs are well aerated bilaterally. No focal infiltrate is noted. No sizable effusion is seen. Mild interstitial changes are noted stable from the previous exam. Postsurgical changes are noted. Mid thoracic compression deformity is again noted and stable. IMPRESSION: No acute abnormality noted. Electronically Signed   By: Inez Catalina M.D.   On: 09/07/2015 10:39   Dg Chest 2 View  09/05/2015  CLINICAL DATA:  Acute onset of mid chest pain, radiating to the back. Initial encounter. EXAM: CHEST  2 VIEW COMPARISON:  Chest radiograph from 08/17/2015 FINDINGS: The lungs are well-aerated. Peribronchial thickening is noted, with mild bilateral atelectasis or scarring. There is no evidence of pleural effusion or pneumothorax. The heart is borderline normal in size. No acute osseous abnormalities are seen. Thoracolumbar spinal fusion hardware is noted. Chronic compression deformities are noted at the mid thoracic spine. IMPRESSION: Peribronchial thickening, with mild bilateral atelectasis or scarring. Lungs otherwise grossly clear. Electronically Signed   By: Garald Balding M.D.   On: 09/05/2015 21:19   Dg Chest 2 View  08/17/2015  CLINICAL DATA:  Cough.  Recent back surgery. EXAM: CHEST  2 VIEW COMPARISON:  08/12/2015 FINDINGS: The heart size and mediastinal contours are within normal limits. Mild subsegmental atelectasis in the left lower lung shows no significant change. No evidence of pneumothorax or pleural effusion. No evidence of pulmonary consolidation or edema. Right arm PICC line is seen in appropriate position. Posterior thoracolumbar spine fusion hardware again noted. IMPRESSION: Mild left lower  lobe atelectasis, without significant change. No evidence of pulmonary consolidation or pleural effusion. Electronically Signed   By: Earle Gell M.D.   On: 08/17/2015 18:19   Mr Brain Wo Contrast  08/12/2015  CLINICAL DATA:  Acute encephalopathy. Possibly from medication. Altered mental status. EXAM: MRI HEAD WITHOUT CONTRAST TECHNIQUE: Multiplanar, multiecho pulse sequences of the brain and surrounding structures were obtained without intravenous contrast. COMPARISON:  CT head 07/26/2015. FINDINGS: No evidence for acute infarction, hemorrhage, mass lesion, hydrocephalus, or extra-axial fluid. Generalized atrophy. Moderate T2 and FLAIR hyperintensity throughout the white matter, likely chronic microvascular ischemic change. Flow voids are maintained throughout the carotid, basilar, and vertebral arteries. There are no areas of chronic hemorrhage. Pituitary, pineal, and cerebellar tonsils unremarkable. No upper cervical lesions. BILATERAL mastoid effusions without nasopharyngeal lesion, likely secondary to recumbency, although given the persistence since previous CT, BILATERAL mastoiditis not excluded. 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 BILATERAL cataract extraction. IMPRESSION: No acute intracranial findings.  Atrophy and small vessel disease. BILATERAL mastoid fluid  without nasopharyngeal lesion. Likely effusions. Correlate clinically for mastoiditis. Chronic and possibly acute RIGHT maxillary sinus disease. Electronically Signed   By: Staci Righter M.D.   On: 08/12/2015 14:00   US Abdomen Limited  09/07/2015  CLINICAL DATA:  Right upper quadrant abdominal pain since last Friday. EXAM: US ABDOMEN LIMITED - RIGHT UPPER QUADRANT COMPARISON:  None. FINDINGS: Gallbladder: Multiple echogenic shadowing gallstones. The largest measures 9 mm. No gallbladder wall thickening, pericholecystic fluid or sonographic Murphy sign to suggest acute cholecystitis. Common bile duct: Diameter: 4.0 mm Liver:  Normal echogenicity without focal lesion or biliary dilatation. IMPRESSION: Cholelithiasis without sonographic findings for acute cholecystitis. Normal caliber common bile duct and normal liver. Electronically Signed   By: Marijo Sanes M.D.   On: 09/07/2015 13:51   Dg Chest Port 1 View  08/12/2015  CLINICAL DATA:  Dyspnea, hypertension, pneumonia 2/17 EXAM: PORTABLE CHEST 1 VIEW COMPARISON:  08/10/2015 FINDINGS: Cardiomediastinal silhouette is stable. No infiltrate or pulmonary edema. Lower thoracic dextroscoliosis again noted. Again noted metallic fixation rods lower thoracic spine and lumbar spine. Right arm PICC line is unchanged in position. No infiltrate or pulmonary edema. Persistent linear atelectasis or scarring in lingula and left base. IMPRESSION: No infiltrate or pulmonary edema. Persistent linear atelectasis or scarring in left base. Electronically Signed   By: Lahoma Crocker M.D.   On: 08/12/2015 12:49   Portable Chest 1 View  08/10/2015  CLINICAL DATA:  Shortness of breath EXAM: PORTABLE CHEST 1 VIEW COMPARISON:  08/05/2015 FINDINGS: Low lung volumes with bibasilar atelectasis, worse on the left. Stable cardiomegaly without edema or effusion. No pneumothorax. Right PICC line tip mid SVC level. Residual scoliosis of the spine. Thoracolumbar fusion hardware partially imaged. Trachea is midline. IMPRESSION: Cardiomegaly with basilar atelectasis, worse on the left. Electronically Signed   By: Jerilynn Mages.  Shick M.D.   On: 08/10/2015 18:35   Dg Swallowing Func-speech Pathology  08/25/2015  Objective Swallowing Evaluation: Type of Study: MBS-Modified Barium Swallow Study Patient Details Name: Curtis Frazier MRN: [id number] Date of Birth: May 22, 1941 Today's Date: 08/25/2015 Time: SLP Start Time (ACUTE ONLY): 0940-SLP Stop Time (ACUTE ONLY): 1015 SLP Time Calculation (min) (ACUTE ONLY): 35 min Past Medical History: Past Medical History Diagnosis Date . Other and unspecified hyperlipidemia  . Coronary atherosclerosis of  unspecified type of vessel, native or graft  . Personal history of colonic polyps  . Actinic keratosis  . Psoriatic arthropathy ([place])  . Other psoriasis  . Scoliosis (and kyphoscoliosis), idiopathic  . GI bleed 12/10   cecam AVM . Gastritis 12/10 . Hyperlipidemia    takes Pravastatin daily . Hypertension    takes Metoprolol daily . Shortness of breath    with exertion . Headache(784.0)    occasionally . Joint pain  . Joint swelling  . Chronic back pain    scoliosis/stenosis/radiculopathy,degenerative disc disease . Psoriasis  . Bruises easily  . Esophageal reflux    takes Omeprazole daily . History of colonic polyps  . Hemorrhoids  . Kidney stone    hx of . Blood transfusion 2010 . PMR (polymyalgia rheumatica) (HCC) 01/20/2012   tx by specialist on a long prednisone taper . S/P PTCA (percutaneous transluminal coronary angioplasty)  . Rheumatoid arthritis(714.0)    takes Methotrexate 7pills weekly . Degeneration of intervertebral disc, site unspecified  . Myocardial infarction ([place]) 2010 . Pneumonia 2008; 07/2015 . Type II or unspecified type diabetes mellitus without mention of complication, not stated as uncontrolled  . Skin cancer    "arms" Past Surgical  History: Past Surgical History Procedure Laterality Date . Rotator cuff repair Left 2000 . Colonoscopy   . Vasectomy  1979 . Coronary angioplasty with stent placement  02/2009   has one stent . Carotid doppler  10/12   0-39% R and 60-79% left  . Cataract extraction  2012 . Cardiac catheterization  2010 . Lithotripsy  2009 . Tee without cardioversion N/A 08/06/2015   Procedure: TRANSESOPHAGEAL ECHOCARDIOGRAM (TEE);  Surgeon: Skeet Latch, MD;  Location: [place];  Service: Cardiovascular;  Laterality: N/A; . Cardioversion N/A 08/06/2015   Procedure: CARDIOVERSION;  Surgeon: Skeet Latch, MD;  [facility] END[facility]COPY;  Service: Cardiovascular;  Laterality: N/A; . Back surgery   . Lumbar laminectomy  07/2015 . Posterior laminectomy thoracic spine     "fixed the  discs" . Skin cancer excision     "arms" HPI: 75 year old male with history of hypertension, GERD, A. fib, sepsis related to pneumonia with related shock, respiratory failure requiring intubation, A. fib with RVR admitted from [facility] with AMS suspicious for infectious vs medication induced etiologies. FEES 3/2 with moderate oropharyngeal dysphagia with recommendations for dysphagia 1 textures with honey thick liquids via tsp. Patient admitted to [facility] 3/7 and has been participating in dysphagia treatment. MBS today to assess for possible diet upgrade.  Subjective: pt is eager for a cup of coffee Assessment / Plan / Recommendation CHL IP CLINICAL IMPRESSIONS 08/25/2015 Therapy Diagnosis Mild oral phase dysphagia;Mild pharyngeal phase dysphagia Clinical Impression Patient demonstrates a mild oropharyngeal dysphagia characterized by delayed oral transit with piecemeal swallowing with delayed swallow initiation resulting in impaired timing of swallow and intermittent silent penetration of nectar-thick and thin liquids via cup. A cued cough was effective in expelling penetrates from the laryngeal vestibule and a chin tuck was effective in eliminating penetration episodes. Recommend patient initiate a diet of regular textures with thin liquids with full supervision for utilization of swallowing compensatory strategies.  Patient and daughter verbalize understanding of all information.  Impact on safety and function Mild aspiration risk   CHL IP TREATMENT RECOMMENDATION 08/25/2015 Treatment Recommendations Therapy as outlined in treatment plan below   Prognosis 08/25/2015 Prognosis for Safe Diet Advancement Good Barriers to Reach Goals -- Barriers/Prognosis Comment -- CHL IP DIET RECOMMENDATION 08/25/2015 SLP Diet Recommendations Regular solids;Thin liquid Liquid Administration via Cup;No straw Medication Administration Crushed with puree Compensations Minimize environmental distractions;Slow rate;Small  sips/bites;Multiple dry swallows after each bite/sip;Hard cough after swallow;Chin tuck Postural Changes Seated upright at 90 degrees   CHL IP OTHER RECOMMENDATIONS 08/25/2015 Recommended Consults -- Oral Care Recommendations Oral care BID Other Recommendations --   CHL IP FOLLOW UP RECOMMENDATIONS 08/25/2015 Follow up Recommendations Outpatient SLP;24 hour supervision/assistance   CHL IP FREQUENCY AND DURATION 08/25/2015 Speech Therapy Frequency (ACUTE ONLY) min 5x/week Treatment Duration 2 weeks      CHL IP ORAL PHASE 08/25/2015 Oral Phase Impaired Oral - Pudding Teaspoon -- Oral - Pudding Cup -- Oral - Honey Teaspoon Piecemeal swallowing;Delayed oral transit Oral - Honey Cup Piecemeal swallowing;Delayed oral transit Oral - Nectar Teaspoon Piecemeal swallowing;Delayed oral transit Oral - Nectar Cup Piecemeal swallowing;Delayed oral transit Oral - Nectar Straw -- Oral - Thin Teaspoon Piecemeal swallowing;Delayed oral transit Oral - Thin Cup Delayed oral transit;Piecemeal swallowing Oral - Thin Straw Delayed oral transit;Piecemeal swallowing Oral - Puree Delayed oral transit;Piecemeal swallowing Oral - Mech Soft Delayed oral transit;Piecemeal swallowing Oral - Regular -- Oral - Multi-Consistency -- Oral - Pill -- Oral Phase - Comment --  CHL IP PHARYNGEAL PHASE  08/25/2015 Pharyngeal Phase Impaired Pharyngeal- Pudding Teaspoon -- Pharyngeal -- Pharyngeal- Pudding Cup -- Pharyngeal -- Pharyngeal- Honey Teaspoon Delayed swallow initiation-vallecula;Reduced tongue base retraction Pharyngeal Material does not enter airway Pharyngeal- Honey Cup Delayed swallow initiation-vallecula;Pharyngeal residue - valleculae Pharyngeal Material does not enter airway Pharyngeal- Nectar Teaspoon Delayed swallow initiation-vallecula;Pharyngeal residue - valleculae Pharyngeal Material does not enter airway Pharyngeal- Nectar Cup Compensatory strategies attempted (with notebox);Penetration/Aspiration during swallow;Delayed swallow  initiation-vallecula;Pharyngeal residue - valleculae Pharyngeal Material enters airway, remains ABOVE vocal cords and not ejected out Pharyngeal- Nectar Straw -- Pharyngeal -- Pharyngeal- Thin Teaspoon Delayed swallow initiation-vallecula;Pharyngeal residue - valleculae Pharyngeal -- Pharyngeal- Thin Cup Delayed swallow initiation-vallecula;Penetration/Aspiration during swallow;Compensatory strategies attempted (with notebox);Pharyngeal residue - valleculae Pharyngeal Material enters airway, CONTACTS cords and not ejected out Pharyngeal- Thin Straw Delayed swallow initiation-vallecula;Penetration/Aspiration during swallow Pharyngeal Material enters airway, CONTACTS cords and not ejected out Pharyngeal- Puree Delayed swallow initiation-vallecula;Pharyngeal residue - valleculae Pharyngeal Material does not enter airway Pharyngeal- Mechanical Soft Delayed swallow initiation-vallecula;Pharyngeal residue - valleculae Pharyngeal -- Pharyngeal- Regular -- Pharyngeal -- Pharyngeal- Multi-consistency -- Pharyngeal -- Pharyngeal- Pill -- Pharyngeal -- Pharyngeal Comment --  CHL IP CERVICAL ESOPHAGEAL PHASE 08/25/2015 Cervical Esophageal Phase WFL Pudding Teaspoon -- Pudding Cup -- Honey Teaspoon -- Honey Cup -- Nectar Teaspoon -- Nectar Cup -- Nectar Straw -- Thin Teaspoon -- Thin Cup -- Thin Straw -- Puree -- Mechanical Soft -- Regular -- Multi-consistency -- Pill -- Cervical Esophageal Comment -- CHL IP GO 08/16/2015 Functional Assessment Tool Used ASHA NOMS and clinical judgment.   Functional Limitations Swallowing Swallow Current Status [phone]) (None) Swallow Goal Status (I9678) (None) Swallow Discharge Status [phone]) (None) Motor Speech Current Status [phone]) (None) Motor Speech Goal Status [phone]) (None) Motor Speech Goal Status [phone]) (None) Spoken Language Comprehension Current Status [phone]) (None) Spoken Language Comprehension Goal Status (R4431) (None) Spoken Language Comprehension Discharge Status [phone]) (None)  Spoken Language Expression Current Status [phone]) (None) Spoken Language Expression Goal Status [phone]) (None) Spoken Language Expression Discharge Status [phone]) (None) Attention Current Status (P8099) (None) Attention Goal Status (I3382) (None) Attention Discharge Status [phone]) (None) Memory Current Status (J6734) (None) Memory Goal Status (L9379) (None) Memory Discharge Status (K2409) (None) Voice Current Status (B3532) (None) Voice Goal Status (D9242) (None) Voice Discharge Status [phone]) (None) Other Speech-Language Pathology Functional Limitation [phone]) (None) Other Speech-Language Pathology Functional Limitation Goal Status (L7989) (None) Other Speech-Language Pathology Functional Limitation Discharge Status [phone]) (None) PAYNE, COURTNEY 08/25/2015, 4:32 PM              Ct Angio Chest Aorta W/cm &/or Wo/cm  09/06/2015  CLINICAL DATA:  Chest pain radiating to the back beginning at 1600 hours. Similar symptoms September 04, 2015. History of polymyalgia rheumatica, diabetes, CHF. EXAM: CT ANGIOGRAPHY CHEST WITH AND WITHOUT CONTRAST TECHNIQUE: Multidetector CT imaging of the chest was performed using the standard protocol during bolus administration of intravenous contrast. Multiplanar CT image reconstructions and MIPs were obtained to evaluate the vascular anatomy. CONTRAST:  52m OMNIPAQUE IOHEXOL 350 MG/ML SOLN COMPARISON:  CT chest July 26, 2015 and chest radiograph September 05, 2015 FINDINGS: MEDIASTINUM: The heart is mildly enlarged, similar to prior examination. Small residual pericardial effusion. Moderate coronary artery calcifications. Noncontrast CT of the thoracic aorta shows no abnormal density. Postcontrast imaging of the aorta shows homogeneous contrast opacification, normal in course and caliber with moderate calcific atherosclerosis and and intimal thickening. No aneurysm, dissection, suspicious luminal irregularity, periaortic fluid collections or contrast extravasation. High-grade stenosis  LEFT V1 segment due to atherosclerosis, patent arch vessels.  Scattered prominent though not pathologically enlarged mediastinal lymph nodes measure up to 9 mm, previously 11 mm short axis. LUNGS: Tracheobronchial tree is patent, no pneumothorax. Complete resolution of pleural effusions. Dependent atelectasis and/or scarring. RIGHT upper lobe atelectasis. No focal consolidation. Interval extubation. SOFT TISSUES AND OSSEOUS STRUCTURES: Thoracolumbar PLIF. Osteopenia stable mild T5, moderate to severe to T7, moderate T8 mild T9 compression fractures. Multiple small gallstones. Colonic diverticulosis partially imaged. 13 mm exophytic RIGHT interpolar cyst. Multiple thyroid nodules measure up to 14 mm, which do not reach size criteria for recommended follow-up. Review of the MIP images confirms the above findings. IMPRESSION: No acute vascular process. Moderate atherosclerosis and high-grade stenosis LEFT vertebral artery origin. Stable cardiomegaly, small residual pericardial effusion. Resolution of pleural effusions. No acute pulmonary process. Electronically Signed   By: Elon Alas M.D.   On: 09/06/2015 02:04   Dg Hip Unilat With Pelvis 2-3 Views Right  09/01/2015  CLINICAL DATA:  No known injury, right hip pain EXAM: DG HIP (WITH OR WITHOUT PELVIS) 2-3V RIGHT COMPARISON:  None. FINDINGS: Mild narrowing of both hips was mild right osteophyte formation. No fracture or dislocation. Pelvic bones intact. Lumbar spinal rods partially visualized. Calcification of the aortoiliac and femoral arteries bilaterally. IMPRESSION: No acute findings Electronically Signed   By: Skipper Cliche M.D.   On: 09/01/2015 11:45     EKG: (Independently reviewed)  Sinus tachycardia with frequent unifocal PVCs, ventricular rate 97 bpm, QTC 480 normally seconds, left axis deviation, nonspecified order line interventricular conduction delay, a definitive ischemic changes   Assessment & Plan  Principal Problem:   Symptomatic  cholelithiasis/ GERD -Patient presents with what seems like classic symptoms of biliary colic without evidence of acute cholecystitis on ultrasound -Admit to stepdown/Inpatient -I have asked EDP to consult general surgery for input -Hepatobiliary scan tomorrow-no narcotics!! -Low fat diet and then NPO after midnight -Tylenol for pain -Repeat LFTs in a.m.  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/Hypotension -Patient had issues of orthostatic hypotension during previous protracted hospitalization that required stress dose steroids -Although patient does have low blood pressure, the parameters for sepsis have not been met and at best meets SIRS criteria (oral temperature is less than 100.9, he does not have leukocytosis, he has stable chronic kidney disease, he is completely alert without any altered mentation and his lactate is normal 0.96) -In further discussion with the patient's daughter, since discharge on 3/23 patient has not had any prednisone and I'm concerned he may be having mild adrenal insufficiency therefore will check a stat cortisol level; THEN begin stress dose steroids    Essential hypertension, benign -Blood pressure suboptimal as above -Not on antihypertensive medications prior to admission    Aortic stenosis/chronic diastolic heart failure -Appears compensated    Type 2 diabetes mellitus without complication, without long-term current use of insulin  -Holding OHA/Glucotrol for now -Follow CBGs every 4 hours and provide SSI    Slow transit constipation -Daughter reports issues with constipation while on iron at home; states constipation better while hospitalized because was receiving regular stool softeners but has not taking at home -Constipation symptoms could've also contributed to patient's presenting symptoms -Check 2 view abdominal series to see the patient has severe constipation    Persistent atrial fibrillation  -Currently maintaining sinus  rhythm/sinus tachycardia with frequent PVCs -Potassium normal but with frequent PVCs will check magnesium level -Pharmacy to manage warfarin -CHADVASC = 6    Elevated lipids -Not on statin prior to admission  ANEMIA-UNSPECIFIED -Currently hemoglobin stable and at baseline of around 10 -Holding iron well acutely ill    Coronary atherosclerosis -Evaluated by primary cardiologist Dr. Marlou Porch within the past 24 hours for chest pain felt to be nonischemic    BPH  -Not on Flomax -Monitor for urinary retention    DVT Prophylaxis: Warfarin  Family Communication:   Daughter at bedside  Code Status:  Full code  Condition:  Guarded  Discharge disposition: Anticipate discharge to previous home environment once medically stable  Time spent in minutes : 60      Jamarria Real L. ANP on 09/07/2015 at 5:14 PM  You may contact me by going to [email or web address] - password [email or web address]  I am available from 7a-7p but please confirm I am on the schedule by going to Amion as above.   After 7p please contact night coverage person covering me after hours  [facility]

## 2015-09-07 NOTE — ED Notes (Signed)
Patient transported to Ultrasound 

## 2015-09-07 NOTE — ED Notes (Signed)
Dr. Johnney Killian MD at bedside.

## 2015-09-07 NOTE — Progress Notes (Signed)
ANTICOAGULATION CONSULT NOTE - Initial Consult  Pharmacy Consult for coumadin Indication: atrial fibrillation  Allergies  Allergen Reactions  . Ace Inhibitors Other (See Comments)    Causes high K  . Angiotensin Receptor Blockers Other (See Comments)    Causes high K  . Metoprolol Other (See Comments)    May cause elevated K level  . Penicillins Swelling and Rash    Has patient had a PCN reaction causing immediate rash, facial/tongue/throat swelling, SOB or lightheadedness with hypotension: YES Has patient had a PCN reaction causing severe rash involving mucus membranes or skin necrosis: NO Has patient had a PCN reaction that required hospitalization NO Has patient had a PCN reaction occurring within the last 10 years: NO If all of the above answers are "NO", then may proceed with Cephalosporin use.  . Tetracycline Swelling and Rash    Patient Measurements: Weight: 171 lb (77.565 kg)   Vital Signs: Temp: 100.8 F (38.2 C) (03/28 1421) Temp Source: Rectal (03/28 1421) BP: 87/61 mmHg (03/28 1615) Pulse Rate: 106 (03/28 1615)  Labs:  Recent Labs  09/05/15 2136 09/06/15 0335 09/06/15 0343  09/06/15 1525 09/07/15 1037 09/07/15 1304 09/07/15 1417 09/07/15 1450  HGB 11.0*  --   --   --   --  10.1*  --   --  13.3  HCT 33.8*  --   --   --   --  32.5*  --   --  41.4  PLT 256  --   --   --   --  247  --   --  154  APTT  --   --  35  --   --   --   --   --   --   LABPROT  --  18.4*  --   --   --   --  15.8*  --   --   INR  --  1.52*  --   --   --   --  1.25  --   --   CREATININE 1.82*  --   --   --   --  1.66*  --   --  1.62*  TROPONINI  --   --  0.04*  < > <0.03 0.04*  --  0.04*  --   < > = values in this interval not displayed.  Estimated Creatinine Clearance: 37.5 mL/min (by C-G formula based on Cr of 1.62).   Medical History: Past Medical History  Diagnosis Date  . Other and unspecified hyperlipidemia   . Coronary atherosclerosis of unspecified type of vessel,  native or graft   . Personal history of colonic polyps   . Actinic keratosis   . Psoriatic arthropathy ([place])   . Other psoriasis   . Scoliosis (and kyphoscoliosis), idiopathic   . GI bleed 12/10    cecam AVM  . Gastritis 12/10  . Hyperlipidemia     takes Pravastatin daily  . Hypertension     takes Metoprolol daily  . Shortness of breath     with exertion  . Headache(784.0)     occasionally  . Joint pain   . Joint swelling   . Chronic back pain     scoliosis/stenosis/radiculopathy,degenerative disc disease  . Psoriasis   . Bruises easily   . Esophageal reflux     takes Omeprazole daily  . History of colonic polyps   . Hemorrhoids   . Kidney stone     hx of  . Blood transfusion  2010  . PMR (polymyalgia rheumatica) ([place]) 01/20/2012    tx by specialist on a long prednisone taper  . S/P PTCA (percutaneous transluminal coronary angioplasty)   . Rheumatoid arthritis(714.0)     takes Methotrexate 7pills weekly  . Degeneration of intervertebral disc, site unspecified   . Myocardial infarction ([place]) 2010  . Pneumonia 2008; 07/2015  . Type II or unspecified type diabetes mellitus without mention of complication, not stated as uncontrolled   . Skin cancer     "arms"  . CHF (congestive heart failure) [facility])    Assessment: 75 yo M on coumadin PTA for afib. Home dose "10 mg x 2 days then resume 5 mg daily at 6pm: per med rec.  Admission INR low at 1.25.  Abdominal ultrasound: cholelithiasis w/o acute cholecystitis. General surgery consulted.  D/w Erin Hearing, NP - will not give coumadin tonight pending surgical consult.  Will start LMWH 40 qhs for now.  Goal of Therapy:  INR 2-3 Monitor platelets by anticoagulation protocol: Yes   Plan:  D/w Erin Hearing, NP - no coumadin tonight b/c pending surgery consult LMWH 40 sq qhs Daily INR  Eudelia Bunch, Pharm.D. [id number] 09/07/2015 6:54 PM

## 2015-09-07 NOTE — Progress Notes (Signed)
Pharmacy Antibiotic Note  Curtis Frazier is a 75 y.o. male admitted on 09/07/2015 with sepsis.  Pharmacy has been consulted for aztreonam, vancomycin, and levaquin dosing. Afebrile, WBC wnl. SCr 1.66, CrCl ~58ml/min.  Plan: Give aztreonam 2g IV x 1, then start aztreonam 1g IV Q8 Give vancomycin 1.5g IV x 1, then start vancomycin 1g IV Q24 Give levaquin 750mg  IV x 1, then start 750mg  IV Q48 Monitor clinical picture, renal function, VT at Css F/U C&S, abx deescalation / LOT  Consider need to continue double gram negative coverage?   Weight: 171 lb (77.565 kg)  Temp (24hrs), Avg:100.4 F (38 C), Min:100 F (37.8 C), Max:100.8 F (38.2 C)   Recent Labs Lab 09/01/15 0637 09/05/15 2136 09/07/15 1037  WBC  --  9.8 9.8  CREATININE 1.38* 1.82* 1.66*    Estimated Creatinine Clearance: 36.6 mL/min (by C-G formula based on Cr of 1.66).    Allergies  Allergen Reactions  . Ace Inhibitors Other (See Comments)    Causes high K  . Angiotensin Receptor Blockers Other (See Comments)    Causes high K  . Metoprolol Other (See Comments)    May cause elevated K level  . Penicillins Swelling and Rash  . Tetracycline Swelling and Rash    Antimicrobials this admission: Aztreonam 3/28 >>  Vancomycin 3/28 >> Levaquin 3/28 >>  Dose adjustments this admission: n/a  Microbiology results: 3/28 BCx: sent 3/28 UCx: sent  Thank you for allowing pharmacy to be a part of this patient's care.  Reginia Naas 09/07/2015 2:38 PM

## 2015-09-07 NOTE — ED Notes (Signed)
Pt arrives via EMS from home with substernal chest pain relieved with 1SL nitro. 324mg  ASA, 20g RAC IV and 300cc saline given for hypotension. Pt with afib rate variable from 90-130s with exertion. Pt now c/o stabbing sharp back pain. Md notified of patients arrival. Will start CP protocol.

## 2015-09-08 ENCOUNTER — Inpatient Hospital Stay (HOSPITAL_COMMUNITY): Payer: Medicare Other

## 2015-09-08 DIAGNOSIS — I959 Hypotension, unspecified: Secondary | ICD-10-CM | POA: Insufficient documentation

## 2015-09-08 DIAGNOSIS — I480 Paroxysmal atrial fibrillation: Secondary | ICD-10-CM | POA: Insufficient documentation

## 2015-09-08 DIAGNOSIS — E118 Type 2 diabetes mellitus with unspecified complications: Secondary | ICD-10-CM | POA: Insufficient documentation

## 2015-09-08 LAB — COMPREHENSIVE METABOLIC PANEL
ALK PHOS: 49 U/L (ref 38–126)
ALT: 15 U/L — AB (ref 17–63)
AST: 18 U/L (ref 15–41)
Albumin: 2.1 g/dL — ABNORMAL LOW (ref 3.5–5.0)
Anion gap: 8 (ref 5–15)
BILIRUBIN TOTAL: 0.9 mg/dL (ref 0.3–1.2)
BUN: 12 mg/dL (ref 6–20)
CALCIUM: 7.1 mg/dL — AB (ref 8.9–10.3)
CO2: 16 mmol/L — AB (ref 22–32)
CREATININE: 1.35 mg/dL — AB (ref 0.61–1.24)
Chloride: 114 mmol/L — ABNORMAL HIGH (ref 101–111)
GFR, EST AFRICAN AMERICAN: 57 mL/min — AB (ref >60)
GFR, EST NON AFRICAN AMERICAN: 49 mL/min — AB (ref >60)
Glucose, Bld: 145 mg/dL — ABNORMAL HIGH (ref 65–99)
Potassium: 4.6 mmol/L (ref 3.5–5.1)
Sodium: 138 mmol/L (ref 135–145)
TOTAL PROTEIN: 4.6 g/dL — AB (ref 6.5–8.1)

## 2015-09-08 LAB — CBC
HEMATOCRIT: 37.9 % — AB (ref 39.0–52.0)
HEMOGLOBIN: 12.4 g/dL — AB (ref 13.0–17.0)
MCH: 32.2 pg (ref 26.0–34.0)
MCHC: 32.7 g/dL (ref 30.0–36.0)
MCV: 98.4 fL (ref 78.0–100.0)
Platelets: 168 10*3/uL (ref 150–400)
RBC: 3.85 MIL/uL — AB (ref 4.22–5.81)
RDW: 16 % — ABNORMAL HIGH (ref 11.5–15.5)
WBC: 7.9 10*3/uL (ref 4.0–10.5)

## 2015-09-08 LAB — GLUCOSE, CAPILLARY
GLUCOSE-CAPILLARY: 135 mg/dL — AB (ref 65–99)
GLUCOSE-CAPILLARY: 259 mg/dL — AB (ref 65–99)
GLUCOSE-CAPILLARY: 103 mg/dL — AB (ref 65–99)

## 2015-09-08 LAB — PROTIME-INR
INR: 1.45 (ref 0.00–1.49)
Prothrombin Time: 17.8 seconds — ABNORMAL HIGH (ref 11.6–15.2)

## 2015-09-08 LAB — TROPONIN I: Troponin I: 0.03 ng/mL (ref <0.031)

## 2015-09-08 LAB — HEMOGLOBIN A1C
HEMOGLOBIN A1C: 6.7 % — AB (ref 4.8–5.6)
Mean Plasma Glucose: 146 mg/dL

## 2015-09-08 LAB — CBG MONITORING, ED: GLUCOSE-CAPILLARY: 129 mg/dL — AB (ref 65–99)

## 2015-09-08 MED ORDER — METOPROLOL TARTRATE 1 MG/ML IV SOLN
2.5000 mg | Freq: Once | INTRAVENOUS | Status: AC
  Administered 2015-09-08: 2.5 mg via INTRAVENOUS
  Filled 2015-09-08: qty 5

## 2015-09-08 MED ORDER — ONDANSETRON HCL 4 MG/2ML IJ SOLN: 4.0000 mg | Freq: Four times a day (QID) | INTRAMUSCULAR | Status: DC | PRN

## 2015-09-08 MED ORDER — COLLAGENASE 250 UNIT/GM EX OINT
TOPICAL_OINTMENT | Freq: Every day | CUTANEOUS | Status: DC
  Administered 2015-09-08 – 2015-09-13 (×5): via TOPICAL
  Administered 2015-09-15: 1 via TOPICAL
  Administered 2015-09-16: 09:00:00 via TOPICAL
  Filled 2015-09-08 (×2): qty 30

## 2015-09-08 MED ORDER — PREDNISONE 10 MG PO TABS
10.0000 mg | ORAL_TABLET | Freq: Every day | ORAL | Status: DC
  Administered 2015-09-09 – 2015-09-16 (×8): 10 mg via ORAL
  Filled 2015-09-08 (×8): qty 1

## 2015-09-08 MED ORDER — ACETAMINOPHEN 325 MG PO TABS
650.0000 mg | ORAL_TABLET | Freq: Four times a day (QID) | ORAL | Status: DC | PRN
  Administered 2015-09-10 – 2015-09-15 (×4): 650 mg via ORAL
  Filled 2015-09-08 (×4): qty 2

## 2015-09-08 MED ORDER — SODIUM CHLORIDE 0.9% FLUSH
3.0000 mL | Freq: Two times a day (BID) | INTRAVENOUS | Status: DC
  Administered 2015-09-08 – 2015-09-15 (×14): 3 mL via INTRAVENOUS

## 2015-09-08 MED ORDER — WARFARIN - PHARMACIST DOSING INPATIENT
Freq: Every day | Status: DC
  Administered 2015-09-08 – 2015-09-10 (×3)

## 2015-09-08 MED ORDER — ACETAMINOPHEN 650 MG RE SUPP: 650.0000 mg | Freq: Four times a day (QID) | RECTAL | Status: DC | PRN

## 2015-09-08 MED ORDER — VITAMIN B-1 100 MG PO TABS
100.0000 mg | ORAL_TABLET | Freq: Every day | ORAL | Status: DC
  Administered 2015-09-09 – 2015-09-16 (×8): 100 mg via ORAL
  Filled 2015-09-08 (×8): qty 1

## 2015-09-08 MED ORDER — TECHNETIUM TC 99M MEBROFENIN IV KIT
5.2000 | PACK | Freq: Once | INTRAVENOUS | Status: AC | PRN
  Administered 2015-09-08: 5 via INTRAVENOUS

## 2015-09-08 MED ORDER — ONDANSETRON HCL 4 MG PO TABS: 4.0000 mg | ORAL_TABLET | Freq: Four times a day (QID) | ORAL | Status: DC | PRN

## 2015-09-08 MED ORDER — WARFARIN SODIUM 10 MG PO TABS
10.0000 mg | ORAL_TABLET | Freq: Once | ORAL | Status: AC
  Administered 2015-09-08: 10 mg via ORAL
  Filled 2015-09-08: qty 1

## 2015-09-08 MED ORDER — PREDNISONE 10 MG PO TABS: 10.0000 mg | ORAL_TABLET | Freq: Every day | ORAL | Status: DC

## 2015-09-08 MED ORDER — PANTOPRAZOLE SODIUM 40 MG PO TBEC
40.0000 mg | DELAYED_RELEASE_TABLET | Freq: Every day | ORAL | Status: DC
  Administered 2015-09-09 – 2015-09-16 (×8): 40 mg via ORAL
  Filled 2015-09-08 (×8): qty 1

## 2015-09-08 NOTE — Progress Notes (Signed)
Utilization review completed.  

## 2015-09-08 NOTE — Consult Note (Signed)
[facility] Surgery Consult Note  Curtis Frazier Oct 07, 1940  [id number].    Requesting MD: Dr. 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  He was transferred back to the hospital on 2/28 due to encephalopathy, lethargy, and poor oral intake.  The patient was just recently discharged on 3/23 from [facility].    He says the epigastric pain started on Saturday 3/25 and it prompted him to go to the ER on Sunday morning due to epigastric abdominal pain with radiation to his lower chest and mid back.  He says the back pain was awful.  No precipitating or alleviating factors.  He had had spaghetti dinner with his grandchildren and the pain didn't start until that night around 8pm.  No similar recent episodes and not associated with foods.  He was eventually discharged home from the ED.  The pain persisted through this week and ultimately became so severe, sharp and constant on 09/07/15 that he called the ambulance.  The pain was associated with nausea, and SOB because it would hurt when he took in a deep breath.  He thought he was having a heart attack because of where the pain was localized.  He has never had a problem with his gallbladder, nor had abdominal surgery.    He has been taking methotrexate and steroids for many years and prior to that he was started on omeprazole and folic acid to prevent ulcer disease.  He took those consistently and surprisingly his baseline GERD resolved, he continues to take those now.  He denies any significant NSAID use or alcohol or tobacco use.  He admits to being chronically constipated due to iron pills, but he's been taking stool softeners.  He denies any blood in  his stools of vomiting blood.  He has never had an EGD.  He has seen Dr. Henrene Pastor, [facility] for his preventative colonoscopies.  He had an ablation to colonic AVM's back in 2010, multiple adenomas 2009.  Most recent colonoscopy 08/19/2013 which was normal other than diminutive polyps, he was recommended for repeat colonoscopy in 5 years.    No cardiac or pulmonary sources found for epigastric pain.  US shows stones without cholecystitis, xray shows constipation, CT angio showed colonic diverticulosis, small pericardial effusion, but no PE or other vascular abnormalities.  HIDA is negative for acute cholecystitis.  Lipase is normal, CBC is normal, LFT's are normal.     ROS: All systems reviewed and otherwise negative except for as above  Family History  Problem Relation Age of Onset  . Coronary artery disease Mother   . Diabetes Mother   . CAD Mother   . Coronary artery disease Sister   . Hypertension Sister   . Kidney failure Sister   . CAD Sister   . Diabetes Brother   . Prostate cancer Brother   . Pancreatic cancer Brother   . Diabetes Brother   . Anesthesia problems Neg Hx   . Hypotension Neg Hx   . Malignant hyperthermia Neg Hx   . Pseudochol deficiency Neg Hx     Past Medical History  Diagnosis Date  . Other and unspecified hyperlipidemia   . Coronary atherosclerosis of unspecified type of vessel, native  or graft   . Personal history of colonic polyps   . Actinic keratosis   . Psoriatic arthropathy ([place])   . Other psoriasis   . Scoliosis (and kyphoscoliosis), idiopathic   . GI bleed 12/10    cecam AVM  . Gastritis 12/10  . Hyperlipidemia     takes Pravastatin daily  . Hypertension     takes Metoprolol daily  . Shortness of breath     with exertion  . Headache(784.0)     occasionally  . Joint pain   . Joint swelling   . Chronic back pain     scoliosis/stenosis/radiculopathy,degenerative disc disease  . Psoriasis   . Bruises easily   . Esophageal reflux      takes Omeprazole daily  . History of colonic polyps   . Hemorrhoids   . Kidney stone     hx of  . Blood transfusion 2010  . PMR (polymyalgia rheumatica) ([facility]) 01/20/2012    tx by specialist on a long prednisone taper  . S/P PTCA (percutaneous transluminal coronary angioplasty)   . Rheumatoid arthritis(714.0)     takes Methotrexate 7pills weekly  . Degeneration of intervertebral disc, site unspecified   . Myocardial infarction ([place]) 2010  . Pneumonia 2008; 07/2015  . Type II or unspecified type diabetes mellitus without mention of complication, not stated as uncontrolled   . Skin cancer     "arms"  . CHF (congestive heart failure) [facility])     Past Surgical History  Procedure Laterality Date  . Rotator cuff repair Left 2000  . Colonoscopy    . Vasectomy  1979  . Coronary angioplasty with stent placement  02/2009    has one stent  . Carotid doppler  10/12    0-39% R and 60-79% left   . Cataract extraction  2012  . Cardiac catheterization  2010  . Lithotripsy  2009  . Tee without cardioversion N/A 08/06/2015    Procedure: TRANSESOPHAGEAL ECHOCARDIOGRAM (TEE);  Surgeon: Skeet Latch, MD;  Location: [place];  Service: Cardiovascular;  Laterality: N/A;  . Cardioversion N/A 08/06/2015    Procedure: CARDIOVERSION;  Surgeon: Skeet Latch, MD;  Location: [facility] ENDOSCOPY;  Service: Cardiovascular;  Laterality: N/A;  . Back surgery    . Lumbar laminectomy  07/2015  . Posterior laminectomy thoracic spine      "fixed the discs"  . Skin cancer excision      "arms"    Social History:  reports that he has quit smoking. His smoking use included Cigarettes. He has a 79.5 pack-year smoking history. He has never used smokeless tobacco. He reports that he drinks alcohol. He reports that he does not use illicit drugs.  Allergies:  Allergies  Allergen Reactions  . Ace Inhibitors Other (See Comments)    Causes high K  . Angiotensin Receptor Blockers Other (See Comments)    Causes high K   . Metoprolol Other (See Comments)    May cause elevated K level  . Penicillins Swelling and Rash    Has patient had a PCN reaction causing immediate rash, facial/tongue/throat swelling, SOB or lightheadedness with hypotension: YES Has patient had a PCN reaction causing severe rash involving mucus membranes or skin necrosis: NO Has patient had a PCN reaction that required hospitalization NO Has patient had a PCN reaction occurring within the last 10 years: NO If all of the above answers are "NO", then may proceed with Cephalosporin use.  . Tetracycline Swelling and Rash    Medications Prior to  Admission  Medication Sig Dispense Refill  . acetaminophen (TYLENOL) 325 MG tablet Take 650 mg by mouth every 6 (six) hours as needed (Pain scale of 1-4 or temp > 101).    Marland Kitchen amiodarone (PACERONE) 400 MG tablet Take 0.5 tablets (200 mg total) by mouth daily. 1 tablet 1  . calcium-vitamin D (OSCAL WITH D) 500-200 MG-UNIT tablet Take 1 tablet by mouth daily with breakfast.    . ferrous sulfate 325 (65 FE) MG tablet Take 1 tablet (325 mg total) by mouth 3 (three) times daily with meals. 90 tablet 0  . glipiZIDE (GLUCOTROL) 5 MG tablet Take 1 tablet (5 mg total) by mouth daily before breakfast. 30 tablet 0  . pantoprazole (PROTONIX) 40 MG tablet Take 1 tablet (40 mg total) by mouth daily. 30 tablet 0  . predniSONE (DELTASONE) 10 MG tablet Take 10 mg by mouth daily with breakfast.     . thiamine 100 MG tablet Take 1 tablet (100 mg total) by mouth daily. 30 tablet 0  . warfarin (COUMADIN) 5 MG tablet Take 2 tablets (10 mg total) by mouth daily at 6 PM. Take 54m for 2days then resume 542mdaily, please have INR check in 3-4days  0  . senna (SENOKOT) 8.6 MG TABS tablet Take 2 tablets (17.2 mg total) by mouth daily. (Patient not taking: Reported on 09/07/2015) 120 each 0    Blood pressure 124/67, pulse 111, temperature 98.5 F (36.9 C), temperature source Oral, resp. rate 28, height _0  (1.727 m), weight 81.9  kg (180 lb 8.9 oz), SpO2 92 %. Physical Exam: General: pleasant, WD/WN white male who is laying in bed in NAD HEENT: head is normocephalic, atraumatic.  Sclera are noninjected.  PERRL.  Ears and nose without any masses or lesions.  Mouth is pink and moist Heart: tachycardia with irregular rhythm.  No obvious murmurs, gallops, or rubs noted.  Palpable pedal pulses bilaterally Lungs: CTAB, no wheezes, rhonchi, or rales noted.  Respiratory effort nonlabored Abd: soft, NT/ND, +BS, no masses, hernias, or organomegaly, no scars noted MS: all 4 extremities are symmetrical with no cyanosis, clubbing, or edema. Skin: warm and dry with no masses, lesions, or rashes Psych: A&Ox3 with an appropriate affect.   Results for orders placed or performed during the hospital encounter of 09/07/15 (from the past 48 hour(s))  Basic metabolic panel     Status: Abnormal   Collection Time: 09/07/15 10:37 AM  Result Value Ref Range   Sodium 135 135 - 145 mmol/L   Potassium 4.4 3.5 - 5.1 mmol/L   Chloride 105 101 - 111 mmol/L   CO2 21 (L) 22 - 32 mmol/L   Glucose, Bld 199 (H) 65 - 99 mg/dL   BUN 15 6 - 20 mg/dL   Creatinine, Ser 1.66 (H) 0.61 - 1.24 mg/dL   Calcium 8.1 (L) 8.9 - 10.3 mg/dL   GFR calc non Af Amer 38 (L) >60 mL/min   GFR calc Af Amer 45 (L) >60 mL/min    Comment: (NOTE) The eGFR has been calculated using the CKD EPI equation. This calculation has not been validated in all clinical situations. eGFR's persistently <60 mL/min signify possible Chronic Kidney Disease.    Anion gap 9 5 - 15  Troponin I     Status: Abnormal   Collection Time: 09/07/15 10:37 AM  Result Value Ref Range   Troponin I 0.04 (H) <0.031 ng/mL    Comment:        PERSISTENTLY INCREASED TROPONIN VALUES  IN THE RANGE OF 0.04-0.49 ng/mL CAN BE SEEN IN:       -UNSTABLE ANGINA       -CONGESTIVE HEART FAILURE       -MYOCARDITIS       -CHEST TRAUMA       -ARRYHTHMIAS       -LATE PRESENTING MYOCARDIAL INFARCTION        -COPD   CLINICAL FOLLOW-UP RECOMMENDED.   CBC     Status: Abnormal   Collection Time: 09/07/15 10:37 AM  Result Value Ref Range   WBC 9.8 4.0 - 10.5 K/uL   RBC 3.31 (L) 4.22 - 5.81 MIL/uL   Hemoglobin 10.1 (L) 13.0 - 17.0 g/dL   HCT 32.5 (L) 39.0 - 52.0 %   MCV 98.2 78.0 - 100.0 fL   MCH 30.5 26.0 - 34.0 pg   MCHC 31.1 30.0 - 36.0 g/dL   RDW 16.1 (H) 11.5 - 15.5 %   Platelets 247 150 - 400 K/uL  Hepatic function panel     Status: Abnormal   Collection Time: 09/07/15 10:37 AM  Result Value Ref Range   Total Protein 5.8 (L) 6.5 - 8.1 g/dL   Albumin 2.5 (L) 3.5 - 5.0 g/dL   AST 16 15 - 41 U/L   ALT 19 17 - 63 U/L   Alkaline Phosphatase 55 38 - 126 U/L   Total Bilirubin 0.9 0.3 - 1.2 mg/dL   Bilirubin, Direct 0.2 0.1 - 0.5 mg/dL   Indirect Bilirubin 0.7 0.3 - 0.9 mg/dL  Lipase, blood     Status: None   Collection Time: 09/07/15 10:37 AM  Result Value Ref Range   Lipase 26 11 - 51 U/L  Protime-INR     Status: Abnormal   Collection Time: 09/07/15  1:04 PM  Result Value Ref Range   Prothrombin Time 15.8 (H) 11.6 - 15.2 seconds   INR 1.25 0.00 - 1.49  Troponin I     Status: Abnormal   Collection Time: 09/07/15  2:17 PM  Result Value Ref Range   Troponin I 0.04 (H) <0.031 ng/mL    Comment:        PERSISTENTLY INCREASED TROPONIN VALUES IN THE RANGE OF 0.04-0.49 ng/mL CAN BE SEEN IN:       -UNSTABLE ANGINA       -CONGESTIVE HEART FAILURE       -MYOCARDITIS       -CHEST TRAUMA       -ARRYHTHMIAS       -LATE PRESENTING MYOCARDIAL INFARCTION       -COPD   CLINICAL FOLLOW-UP RECOMMENDED.   I-Stat CG4 Lactic Acid, ED  (not at  [facility])     Status: None   Collection Time: 09/07/15  2:49 PM  Result Value Ref Range   Lactic Acid, Venous 0.96 0.5 - 2.0 mmol/L  Comprehensive metabolic panel     Status: Abnormal   Collection Time: 09/07/15  2:50 PM  Result Value Ref Range   Sodium 134 (L) 135 - 145 mmol/L   Potassium 4.3 3.5 - 5.1 mmol/L   Chloride 108 101 - 111 mmol/L   CO2 18  (L) 22 - 32 mmol/L   Glucose, Bld 140 (H) 65 - 99 mg/dL   BUN 16 6 - 20 mg/dL   Creatinine, Ser 1.62 (H) 0.61 - 1.24 mg/dL   Calcium 7.8 (L) 8.9 - 10.3 mg/dL   Total Protein 5.3 (L) 6.5 - 8.1 g/dL   Albumin 2.3 (L) 3.5 - 5.0 g/dL  AST 16 15 - 41 U/L   ALT 17 17 - 63 U/L   Alkaline Phosphatase 50 38 - 126 U/L   Total Bilirubin 0.9 0.3 - 1.2 mg/dL   GFR calc non Af Amer 40 (L) >60 mL/min   GFR calc Af Amer 46 (L) >60 mL/min    Comment: (NOTE) The eGFR has been calculated using the CKD EPI equation. This calculation has not been validated in all clinical situations. eGFR's persistently <60 mL/min signify possible Chronic Kidney Disease.    Anion gap 8 5 - 15  CBC WITH DIFFERENTIAL     Status: Abnormal   Collection Time: 09/07/15  2:50 PM  Result Value Ref Range   WBC 6.7 4.0 - 10.5 K/uL   RBC 4.26 4.22 - 5.81 MIL/uL   Hemoglobin 13.3 13.0 - 17.0 g/dL    Comment: REPEATED TO VERIFY   HCT 41.4 39.0 - 52.0 %   MCV 97.2 78.0 - 100.0 fL   MCH 31.2 26.0 - 34.0 pg   MCHC 32.1 30.0 - 36.0 g/dL   RDW 16.2 (H) 11.5 - 15.5 %   Platelets 154 150 - 400 K/uL   Neutrophils Relative % 66 %   Neutro Abs 4.5 1.7 - 7.7 K/uL   Lymphocytes Relative 20 %   Lymphs Abs 1.4 0.7 - 4.0 K/uL   Monocytes Relative 13 %   Monocytes Absolute 0.9 0.1 - 1.0 K/uL   Eosinophils Relative 0 %   Eosinophils Absolute 0.0 0.0 - 0.7 K/uL   Basophils Relative 0 %   Basophils Absolute 0.0 0.0 - 0.1 K/uL  I-Stat arterial blood gas, ED ([facility], [facility])     Status: Abnormal   Collection Time: 09/07/15  4:02 PM  Result Value Ref Range   pH, Arterial 7.350 7.350 - 7.450   pCO2 arterial 31.9 (L) 35.0 - 45.0 mmHg   pO2, Arterial 69.0 (L) 80.0 - 100.0 mmHg   Bicarbonate 17.4 (L) 20.0 - 24.0 mEq/L   TCO2 18 0 - 100 mmol/L   O2 Saturation 92.0 %   Acid-base deficit 7.0 (H) 0.0 - 2.0 mmol/L   Patient temperature 100.8 F    Collection site RADIAL, ALLEN'S TEST ACCEPTABLE    Sample type ARTERIAL   Urinalysis, Routine w reflex  microscopic (not at [facility])     Status: None   Collection Time: 09/07/15  5:20 PM  Result Value Ref Range   Color, Urine YELLOW YELLOW   APPearance CLEAR CLEAR   Specific Gravity, Urine 1.011 1.005 - 1.030   pH 5.5 5.0 - 8.0   Glucose, UA NEGATIVE NEGATIVE mg/dL   Hgb urine dipstick NEGATIVE NEGATIVE   Bilirubin Urine NEGATIVE NEGATIVE   Ketones, ur NEGATIVE NEGATIVE mg/dL   Protein, ur NEGATIVE NEGATIVE mg/dL   Nitrite NEGATIVE NEGATIVE   Leukocytes, UA NEGATIVE NEGATIVE    Comment: MICROSCOPIC NOT DONE ON URINES WITH NEGATIVE PROTEIN, BLOOD, LEUKOCYTES, NITRITE, OR GLUCOSE <1000 mg/dL.  Procalcitonin - Baseline     Status: None   Collection Time: 09/07/15  6:26 PM  Result Value Ref Range   Procalcitonin 0.49 ng/mL    Comment:        Interpretation: PCT (Procalcitonin) <= 0.5 ng/mL: Systemic infection (sepsis) is not likely. Local bacterial infection is possible. (NOTE)         ICU PCT Algorithm               Non ICU PCT Algorithm    ----------------------------     ------------------------------  PCT < 0.25 ng/mL                 PCT < 0.1 ng/mL     Stopping of antibiotics            Stopping of antibiotics       strongly encouraged.               strongly encouraged.    ----------------------------     ------------------------------       PCT level decrease by               PCT < 0.25 ng/mL       >= 80% from peak PCT       OR PCT 0.25 - 0.5 ng/mL          Stopping of antibiotics                                             encouraged.     Stopping of antibiotics           encouraged.    ----------------------------     ------------------------------       PCT level decrease by              PCT >= 0.25 ng/mL       < 80% from peak PCT        AND PCT >= 0.5 ng/mL            Continuin g antibiotics                                              encouraged.       Continuing antibiotics            encouraged.    ----------------------------      ------------------------------     PCT level increase compared          PCT > 0.5 ng/mL         with peak PCT AND          PCT >= 0.5 ng/mL             Escalation of antibiotics                                          strongly encouraged.      Escalation of antibiotics        strongly encouraged.   Magnesium     Status: Abnormal   Collection Time: 09/07/15  6:26 PM  Result Value Ref Range   Magnesium 1.4 (L) 1.7 - 2.4 mg/dL  Hemoglobin A1c     Status: Abnormal   Collection Time: 09/07/15  6:26 PM  Result Value Ref Range   Hgb A1c MFr Bld 6.7 (H) 4.8 - 5.6 %    Comment: (NOTE)         Pre-diabetes: 5.7 - 6.4         Diabetes: >6.4         Glycemic control for adults with diabetes: <7.0    Mean Plasma Glucose 146 mg/dL    Comment: (NOTE) Performed At: [id number]  [place], [place] [id number] Lindon Romp MD [id number]   Cortisol     Status: None   Collection Time: 09/07/15  6:26 PM  Result Value Ref Range   Cortisol, Plasma 14.4 ug/dL    Comment: (NOTE) AM    6.7 - 22.6 ug/dL PM   <10.0       ug/dL   I-Stat CG4 Lactic Acid, ED  (not at  [facility])     Status: None   Collection Time: 09/07/15  6:50 PM  Result Value Ref Range   Lactic Acid, Venous 1.29 0.5 - 2.0 mmol/L  CBG monitoring, ED     Status: None   Collection Time: 09/07/15  6:54 PM  Result Value Ref Range   Glucose-Capillary 99 65 - 99 mg/dL  CBG monitoring, ED     Status: Abnormal   Collection Time: 09/07/15 11:37 PM  Result Value Ref Range   Glucose-Capillary 121 (H) 65 - 99 mg/dL  Troponin I     Status: None   Collection Time: 09/07/15 11:43 PM  Result Value Ref Range   Troponin I 0.03 <0.031 ng/mL    Comment:        NO INDICATION OF MYOCARDIAL INJURY.   CBG monitoring, ED     Status: Abnormal   Collection Time: 09/08/15  3:24 AM  Result Value Ref Range   Glucose-Capillary 129 (H) 65 - 99 mg/dL  Protime-INR     Status: Abnormal   Collection Time: 09/08/15  3:26 AM  Result  Value Ref Range   Prothrombin Time 17.8 (H) 11.6 - 15.2 seconds   INR 1.45 0.00 - 1.49  CBC     Status: Abnormal   Collection Time: 09/08/15  3:26 AM  Result Value Ref Range   WBC 7.9 4.0 - 10.5 K/uL   RBC 3.85 (L) 4.22 - 5.81 MIL/uL   Hemoglobin 12.4 (L) 13.0 - 17.0 g/dL   HCT 37.9 (L) 39.0 - 52.0 %   MCV 98.4 78.0 - 100.0 fL   MCH 32.2 26.0 - 34.0 pg   MCHC 32.7 30.0 - 36.0 g/dL   RDW 16.0 (H) 11.5 - 15.5 %   Platelets 168 150 - 400 K/uL  Comprehensive metabolic panel     Status: Abnormal   Collection Time: 09/08/15  3:26 AM  Result Value Ref Range   Sodium 138 135 - 145 mmol/L   Potassium 4.6 3.5 - 5.1 mmol/L   Chloride 114 (H) 101 - 111 mmol/L   CO2 16 (L) 22 - 32 mmol/L   Glucose, Bld 145 (H) 65 - 99 mg/dL   BUN 12 6 - 20 mg/dL   Creatinine, Ser 1.35 (H) 0.61 - 1.24 mg/dL   Calcium 7.1 (L) 8.9 - 10.3 mg/dL   Total Protein 4.6 (L) 6.5 - 8.1 g/dL   Albumin 2.1 (L) 3.5 - 5.0 g/dL   AST 18 15 - 41 U/L   ALT 15 (L) 17 - 63 U/L   Alkaline Phosphatase 49 38 - 126 U/L   Total Bilirubin 0.9 0.3 - 1.2 mg/dL   GFR calc non Af Amer 49 (L) >60 mL/min   GFR calc Af Amer 57 (L) >60 mL/min    Comment: (NOTE) The eGFR has been calculated using the CKD EPI equation. This calculation has not been validated in all clinical situations. eGFR's persistently <60 mL/min signify possible Chronic Kidney Disease.    Anion gap 8 5 - 15   Dg Chest 2 View  09/07/2015  CLINICAL  DATA:  Chest pain EXAM: CHEST  2 VIEW COMPARISON:  09/05/2015 FINDINGS: Cardiac shadow is stable. The lungs are well aerated bilaterally. No focal infiltrate is noted. No sizable effusion is seen. Mild interstitial changes are noted stable from the previous exam. Postsurgical changes are noted. Mid thoracic compression deformity is again noted and stable. IMPRESSION: No acute abnormality noted. Electronically Signed   By: Inez Catalina M.D.   On: 09/07/2015 10:39   Nm Hepatobiliary Liver Func  09/08/2015  CLINICAL DATA:   Epigastric pain.  Symptomatic cholelithiasis EXAM: NUCLEAR MEDICINE HEPATOBILIARY IMAGING TECHNIQUE: Sequential images of the abdomen were obtained out to 60 minutes following intravenous administration of radiopharmaceutical. RADIOPHARMACEUTICALS:  5.2 mCi Tc-69m Choletec IV COMPARISON:  Ultrasound 09/07/2015 FINDINGS: There is prompt uptake and excretion of radiotracer by the liver. Activity is seen within small bowel and gallbladder by 25-30 minutes. IMPRESSION: No evidence of cystic duct or common bile duct obstruction. Electronically Signed   By: KRolm BaptiseM.D.   On: 09/08/2015 09:24   UKoreaAbdomen Limited  09/07/2015  CLINICAL DATA:  Right upper quadrant abdominal pain since last Friday. EXAM: [place]DOMEN LIMITED - RIGHT UPPER QUADRANT COMPARISON:  None. FINDINGS: Gallbladder: Multiple echogenic shadowing gallstones. The largest measures 9 mm. No gallbladder wall thickening, pericholecystic fluid or sonographic Murphy sign to suggest acute cholecystitis. Common bile duct: Diameter: 4.0 mm Liver: Normal echogenicity without focal lesion or biliary dilatation. IMPRESSION: Cholelithiasis without sonographic findings for acute cholecystitis. Normal caliber common bile duct and normal liver. Electronically Signed   By: PMarijo SanesM.D.   On: 09/07/2015 13:51   Dg Abd 2 Views  09/07/2015  CLINICAL DATA:  Constipation and abdomen pain for 3 days EXAM: ABDOMEN - 2 VIEW COMPARISON:  None. FINDINGS: Spinal rods noted. Nonobstructive bowel gas pattern. Moderate fecal retention primarily through the mid colon. Trace lucency on the decubitus view over the edge of the liver. IMPRESSION: Mild to moderate fecal retention. Suggestion of possible free right upper quadrant. Would suggest CT of the abdomen and pelvis. Electronically Signed   By: RSkipper ClicheM.D.   On: 09/07/2015 17:42      Assessment/Plan Epigastric abdominal pain with radiation to back -Due to being on steroids and methotrexate he is a risk  for ulcer disease, may not be unreasonable to get Dr. PHenrene Pastorto see and evaluate him with an EGD either this admission or as an outpatient -Heme-occult was negative on 3/19 Cholelithiasis without signs of acute cholecystitis -HIDA negative, WBC, lipase, & LFT's normal, no pain currently, no need for lap chole this admission Constipation - enemas, bowel regimen    MNat Christen PWilliam [facility] 09/08/2015, 11:57 AM Pager: [phone](7am - 4:30pm M-F; 7am - 11:30am Sa/Su)

## 2015-09-08 NOTE — Progress Notes (Addendum)
Patient ID: Curtis Frazier, male   DOB: [birth year], 75 y.o.   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 He was just discharge 3/27 after being in hospital for what seemed to be chest pain. His pain never got better once he got home. Pain is epigastric, radiating to the back, constant and about 8/10 in intensity. Pain is better with analgesia given in ED, No fevers. No vomiting. No diarrhea or constipation. His HIDA scan on admission was unremarkable. His LFT's are WNL. Surgery has seen him in consultation.   Assessment/Plan:    Principal Problem:   Symptomatic cholelithiasis - HIDA scan normal - LFTs WNL - Tolerates liquid det - Since no acute cholecystitis surgery has now signed off    Active Problems:   Elevated troponin - Likely demand ischemia from epigastric pain - Trop level stable at 0.04 - PVC's on 12 lead ekg but no acute ischemic changes - Will monitor      Rheumatoid arthritis (HCC) / PMR (polymyalgia rheumatica) (HCC) - Continue prednisone   DVT Prophylaxis  - Lovenox subQ   Code Status: Full.  Family Communication:  plan of care discussed with the patient Disposition Plan: home by 3/31 if he feels better   IV access:  Peripheral IV  Procedures and diagnostic studies:    Nm Hepatobiliary Liver Func 09/08/2015   No evidence of cystic duct or common bile duct obstruction. Electronically Signed   By: Rolm Baptise M.D.   On: 09/08/2015 09:24   Medical Consultants:  Surgery  Other Consultants:  None  IAnti-Infectives:   None    Leisa Lenz, MD  [facility] Pager [phone]  Time spent in minutes: 15 minutes  If 7PM-7AM, please contact night-coverage [email or web address] Password [facility] 09/08/2015, 7:12 PM   LOS: 1 day    HPI/Subjective: No acute overnight events. Patient reports pain much better this am.   Objective: Filed Vitals:   09/08/15 0900 09/08/15 1006 09/08/15 1203 09/08/15 1251  BP: 125/81 124/67 120/82 122/75  Pulse: 113 111 116 117  Temp:  98.5 F (36.9 C) 98.4 F (36.9 C) 98.5 F (36.9 C)  TempSrc:  Oral Oral Oral  Resp: 27 28 26 20   Height:  5\' 8"  (1.727 m)    Weight:  81.9 kg (180 lb 8.9 oz)    SpO2: 91% 92% 95% 96%    Intake/Output Summary (Last 24 hours) at 09/08/15 1912 Last data filed at 09/08/15 [id number]  Gross per 24 hour  Intake   3520 ml  Output    200 ml  Net   3320 ml    Exam:   General:  Pt is alert, follows commands appropriately, not in acute distress  Cardiovascular: Regular rate and rhythm, S1/S2 appreciated   Respiratory: Clear to auscultation bilaterally, no wheezing, no crackles, no rhonchi  Abdomen: Soft, non tender, non distended, bowel sounds present  Extremities: No edema, pulses palpable bilaterally  Neuro: Grossly nonfocal  Data Reviewed: Basic Metabolic Panel:  Recent Labs Lab 09/05/15 2136 09/07/15 1037 09/07/15 1450 09/07/15 1826 09/08/15 0326  NA 137 135 134*  --  138  K 4.7 4.4 4.3  --  4.6  CL 107 105 108  --  114*  CO2 21* 21* 18*  --  16*  GLUCOSE 216* 199* 140*  --  145*  BUN 21* 15 16  --  12  CREATININE 1.82* 1.66* 1.62*  --  1.35*  CALCIUM 8.7* 8.1* 7.8*  --  7.1*  MG  --   --   --  1.4*  --    Liver Function Tests:  Recent Labs Lab 09/07/15 1037 09/07/15 1450 09/08/15 0326  AST 16 16 18   ALT 19 17 15*  ALKPHOS 55 50 49  BILITOT 0.9 0.9 0.9  PROT 5.8* 5.3* 4.6*  ALBUMIN 2.5* 2.3* 2.1*    Recent Labs Lab 09/07/15 1037  LIPASE 26   No results for input(s): AMMONIA in the last 168 hours. CBC:  Recent Labs Lab 09/05/15 2136  09/07/15 1037 09/07/15 1450 09/08/15 0326  WBC 9.8 9.8 6.7 7.9  NEUTROABS  --   --  4.5  --   HGB 11.0* 10.1* 13.3 12.4*  HCT 33.8* 32.5* 41.4 37.9*  MCV 98.8 98.2 97.2 98.4  PLT 256 247 154 168   Cardiac Enzymes:  Recent Labs Lab 09/06/15 0940 09/06/15 1525 09/07/15 1037 09/07/15 1417 09/07/15 2343  TROPONINI 0.03 <0.03 0.04* 0.04* 0.03   BNP: Invalid input(s): POCBNP CBG:  Recent Labs Lab 09/07/15 1854 09/07/15 2337 09/08/15 0324 09/08/15 1205 09/08/15 1746  GLUCAP 99 121* 129* 135* 259*    Recent Results (from the past 240 hour(s))  Blood Culture (routine x 2)     Status: None (Preliminary result)   Collection Time: 09/07/15  2:40 PM  Result Value Ref Range Status   Specimen Description BLOOD RIGHT HAND  Final   Special Requests BOTTLES DRAWN AEROBIC AND ANAEROBIC 5CC  Final   Culture NO GROWTH < 24 HOURS  Final   Report Status PENDING  Incomplete  Blood Culture (routine x 2)     Status: None (Preliminary result)   Collection Time: 09/07/15  2:50 PM  Result Value Ref Range Status   Specimen Description BLOOD LEFT ANTECUBITAL  Final   Special Requests BOTTLES DRAWN AEROBIC AND ANAEROBIC 5CC  Final   Culture NO GROWTH < 24 HOURS  Final   Report Status PENDING  Incomplete  Urine culture     Status: None (Preliminary result)   Collection Time: 09/07/15  5:20 PM  Result Value Ref Range Status   Specimen Description URINE, CLEAN CATCH  Final   Special Requests NONE  Final   Culture NO GROWTH < 24 HOURS  Final   Report Status PENDING  Incomplete     Scheduled Meds: . amiodarone  200 mg Oral Daily  . collagenase   Topical Daily  . enoxaparin (LOVENOX) injection  40 mg Subcutaneous QHS  . insulin aspart  0-9 Units Subcutaneous 6 times per day  . pantoprazole  40 mg Oral Daily  . [START ON 09/09/2015] predniSONE  10 mg Oral Q breakfast  . sodium chloride flush  3 mL Intravenous Q12H  . thiamine  100 mg Oral Daily  . Warfarin - Pharmacist Dosing Inpatient    Does not apply q1800   Continuous Infusions: . sodium chloride 75 mL/hr at 09/08/15 0454

## 2015-09-08 NOTE — Progress Notes (Signed)
Pt received from 3S SWOT RN. Pt oriented to room and equipment. VSS. Call light within reach.  Fritz Pickerel, RN

## 2015-09-08 NOTE — Progress Notes (Signed)
ANTICOAGULATION CONSULT NOTE - Follow Up Consult  Pharmacy Consult for Coumadin Indication: atrial fibrillation  Allergies  Allergen Reactions  . Ace Inhibitors Other (See Comments)    Causes high K  . Angiotensin Receptor Blockers Other (See Comments)    Causes high K  . Metoprolol Other (See Comments)    May cause elevated K level  . Penicillins Swelling and Rash    Has patient had a PCN reaction causing immediate rash, facial/tongue/throat swelling, SOB or lightheadedness with hypotension: YES Has patient had a PCN reaction causing severe rash involving mucus membranes or skin necrosis: NO Has patient had a PCN reaction that required hospitalization NO Has patient had a PCN reaction occurring within the last 10 years: NO If all of the above answers are "NO", then may proceed with Cephalosporin use.  . Tetracycline Swelling and Rash    Patient Measurements: Height: 5\' 8"  (172.7 cm) Weight: 180 lb 8.9 oz (81.9 kg) IBW/kg (Calculated) : 68.4   Vital Signs: Temp: 98.5 F (36.9 C) (03/29 1251) Temp Source: Oral (03/29 1251) BP: 122/75 mmHg (03/29 1251) Pulse Rate: 117 (03/29 1251)  Labs:  Recent Labs  09/06/15 0335 09/06/15 0343  09/07/15 1037 09/07/15 1304 09/07/15 1417 09/07/15 1450 09/07/15 2343 09/08/15 0326  HGB  --   --   --  10.1*  --   --  13.3  --  12.4*  HCT  --   --   --  32.5*  --   --  41.4  --  37.9*  PLT  --   --   --  247  --   --  154  --  168  APTT  --  35  --   --   --   --   --   --   --   LABPROT 18.4*  --   --   --  15.8*  --   --   --  17.8*  INR 1.52*  --   --   --  1.25  --   --   --  1.45  CREATININE  --   --   --  1.66*  --   --  1.62*  --  1.35*  TROPONINI  --  0.04*  < > 0.04*  --  0.04*  --  0.03  --   < > = values in this interval not displayed.  Estimated Creatinine Clearance: 45 mL/min (by C-G formula based on Cr of 1.35).   Assessment:  Anticoagulation: AFib on warfarin PTA. INR 1.45 -Coum per RX ordered but pending  surgery consult, Coumadin was held 3/28. On 3/29: Surgery note says no lap chole required this admission. -- PTA warf: 10 mg x2 days then 5 mg daily (new start?)   Goal of Therapy:  INR 2-3 Monitor platelets by anticoagulation protocol: Yes   Plan:  Coumadin 10mg  po x 1 tonight Daily INR.   Ronak Duquette S. Alford Highland, PharmD, BCPS Clinical Staff Pharmacist Pager [phone]  Eilene Ghazi Stillinger 09/08/2015,2:55 PM

## 2015-09-08 NOTE — Consult Note (Signed)
WOC wound consult note Reason for Consult: lumbar incision s/p  Laminectomy in February per Dr. Arnoldo Morale Patient was in inpatient rehab when site was last assessed per neurosurgery and at that time noted to have superficial wound dehiscence, described as 1cm  On 08/16/15.  To follow up with Dr. Arnoldo Morale in 2 weeks for wound re-assessment  Wound type: non healing surgical wound Pressure Ulcer POA: /No Measurement: 1cm x 0.3cm x 0.5cm  Wound bed:100% yellow, slough base Drainage (amount, consistency, odor) not able to assess, no dressing in place. None noted on bed linen Periwound: intact, no erythema  Dressing procedure/placement/frequency: Will add enzymatic debridement ointment to clean out base, cover with dry dressing. Change daily.   Would suggest neurosurgery to re-eval wound since it has not healed at this point, and patient is past the 2 wk follow up with Dr. Arnoldo Morale mentioned on 08/16/15  Discussed POC with patient and bedside nurse.  Re consult if needed, will not follow at this time. Thanks  Alessander Sikorski [place], [place] [phone])

## 2015-09-08 NOTE — Care Management Note (Signed)
Case Management Note Marvetta Gibbons RN, BSN Unit 2W-Case Manager [phone]  Patient Details  Name: Curtis Frazier MRN: [id number] Date of Birth: Jul 31, 1940  Subjective/Objective:     Pt admitted with Symptomatic cholelithiasis/ GERD and ?SIRS               Action/Plan: PTA pt lived at home with wife- active with [facility] for HHRN/PT- will need resumption orders if plan is to return home- CM to follow progression for needs and plan- recently went home from [facility]  Expected Discharge Date:                  Expected Discharge Plan:  [place]  In-House Referral:     Discharge planning Services  CM Consult  Post Acute Care Choice:  [place], Resumption of Svcs/PTA Provider Choice offered to:  Patient  DME Arranged:    DME Agency:     HH Arranged:  RN, PT HH Agency:  [facility]  Status of Service:  In process, will continue to follow  Medicare Important Message Given:    Date Medicare IM Given:    Medicare IM give by:    Date Additional Medicare IM Given:    Additional Medicare Important Message give by:     If discussed at [place] of Stay Meetings, dates discussed:    Additional Comments:  Dawayne Patricia, RN 09/08/2015, 2:58 PM

## 2015-09-08 NOTE — Progress Notes (Deleted)
[facility] Progress Note    Subjective: Patient doing great.  Extubated, but still on Levo  Objective: Vital signs in last 24 hours: Temp:  [98.5 F (36.9 C)-100.8 F (38.2 C)] 100.1 F (37.8 C) (03/28 2332) Pulse Rate:  [92-120] 109 (03/29 0630) Resp:  [17-35] 31 (03/29 0630) BP: (72-131)/(43-85) 104/70 mmHg (03/29 0445) SpO2:  [84 %-98 %] 96 % (03/29 0630) Weight:  [77.565 kg (171 lb)] 77.565 kg (171 lb) (03/28 0955)    Intake/Output from previous day: 03/28 0701 - 03/29 0700 In: 3520 [I.V.:3520] Out: 200 [Urine:200] Intake/Output this shift:    General: No acute distress  Lungs: Clear with some wheezing on the right.  Abd: Soft, moderately tender, good bowel sounds.  Extremities: Edematous globally.  Neuro: Intact  Lab Results:  @LABLAST2 (wbc:2,hgb:2,hct:2,plt:2) BMET ) Recent Labs  09/07/15 1450 09/08/15 0326  NA 134* 138  K 4.3 4.6  CL 108 114*  CO2 18* 16*  GLUCOSE 140* 145*  BUN 16 12  CREATININE 1.62* 1.35*  CALCIUM 7.8* 7.1*   PT/INR  Recent Labs  09/07/15 1304 09/08/15 0326  LABPROT 15.8* 17.8*  INR 1.25 1.45   ABG  Recent Labs  09/07/15 1602  PHART 7.350  HCO3 17.4*    Studies/Results: Dg Chest 2 View  09/07/2015  CLINICAL DATA:  Chest pain EXAM: CHEST  2 VIEW COMPARISON:  09/05/2015 FINDINGS: Cardiac shadow is stable. The lungs are well aerated bilaterally. No focal infiltrate is noted. No sizable effusion is seen. Mild interstitial changes are noted stable from the previous exam. Postsurgical changes are noted. Mid thoracic compression deformity is again noted and stable. IMPRESSION: No acute abnormality noted. Electronically Signed   By: Inez Catalina M.D.   On: 09/07/2015 10:39   US Abdomen Limited  09/07/2015  CLINICAL DATA:  Right upper quadrant abdominal pain since last Friday. EXAM: US ABDOMEN LIMITED - RIGHT UPPER QUADRANT COMPARISON:  None. FINDINGS: Gallbladder: Multiple echogenic shadowing gallstones. The largest measures  9 mm. No gallbladder wall thickening, pericholecystic fluid or sonographic Murphy sign to suggest acute cholecystitis. Common bile duct: Diameter: 4.0 mm Liver: Normal echogenicity without focal lesion or biliary dilatation. IMPRESSION: Cholelithiasis without sonographic findings for acute cholecystitis. Normal caliber common bile duct and normal liver. Electronically Signed   By: Marijo Sanes M.D.   On: 09/07/2015 13:51   Dg Abd 2 Views  09/07/2015  CLINICAL DATA:  Constipation and abdomen pain for 3 days EXAM: ABDOMEN - 2 VIEW COMPARISON:  None. FINDINGS: Spinal rods noted. Nonobstructive bowel gas pattern. Moderate fecal retention primarily through the mid colon. Trace lucency on the decubitus view over the edge of the liver. IMPRESSION: Mild to moderate fecal retention. Suggestion of possible free right upper quadrant. Would suggest CT of the abdomen and pelvis. Electronically Signed   By: Skipper Cliche M.D.   On: 09/07/2015 17:42    Anti-infectives: Anti-infectives    Start     Dose/Rate Route Frequency Ordered Stop   09/09/15 1600  levofloxacin (LEVAQUIN) IVPB 750 mg  Status:  Discontinued     750 mg 100 mL/hr over 90 Minutes Intravenous Every 48 hours 09/07/15 1449 09/07/15 1749   09/07/15 2100  aztreonam (AZACTAM) 1 g in dextrose 5 % 50 mL IVPB  Status:  Discontinued     1 g 100 mL/hr over 30 Minutes Intravenous 3 times per day 09/07/15 1449 09/07/15 1749   09/07/15 1600  vancomycin (VANCOCIN) IVPB 1000 mg/200 mL premix  Status:  Discontinued  1,000 mg 200 mL/hr over 60 Minutes Intravenous Every 24 hours 09/07/15 1449 09/07/15 1749   09/07/15 1500  vancomycin (VANCOCIN) 1,500 mg in sodium chloride 0.9 % 500 mL IVPB     1,500 mg 250 mL/hr over 120 Minutes Intravenous  Once 09/07/15 1439 09/07/15 1708   09/07/15 1430  levofloxacin (LEVAQUIN) IVPB 750 mg     750 mg 100 mL/hr over 90 Minutes Intravenous  Once 09/07/15 1427 09/07/15 1618   09/07/15 1430  aztreonam (AZACTAM) 2 g in  dextrose 5 % 50 mL IVPB  Status:  Discontinued     2 g 100 mL/hr over 30 Minutes Intravenous  Once 09/07/15 1427 09/07/15 1749   09/07/15 1430  vancomycin (VANCOCIN) IVPB 1000 mg/200 mL premix  Status:  Discontinued     1,000 mg 200 mL/hr over 60 Minutes Intravenous  Once 09/07/15 1427 09/07/15 1439      Assessment/Plan: s/p  Ice chips only.  LOS: 1 day   Kathryne Eriksson. Dahlia Bailiff, MD, FACS [phone] [facility] 09/08/2015

## 2015-09-09 ENCOUNTER — Inpatient Hospital Stay (HOSPITAL_COMMUNITY): Payer: Medicare Other

## 2015-09-09 ENCOUNTER — Encounter (HOSPITAL_COMMUNITY): Payer: Self-pay | Admitting: Physician Assistant

## 2015-09-09 ENCOUNTER — Other Ambulatory Visit: Payer: Self-pay | Admitting: *Deleted

## 2015-09-09 DIAGNOSIS — R079 Chest pain, unspecified: Secondary | ICD-10-CM

## 2015-09-09 DIAGNOSIS — N183 Chronic kidney disease, stage 3 unspecified: Secondary | ICD-10-CM | POA: Insufficient documentation

## 2015-09-09 DIAGNOSIS — D638 Anemia in other chronic diseases classified elsewhere: Secondary | ICD-10-CM | POA: Diagnosis present

## 2015-09-09 DIAGNOSIS — K8050 Calculus of bile duct without cholangitis or cholecystitis without obstruction: Secondary | ICD-10-CM

## 2015-09-09 DIAGNOSIS — R7989 Other specified abnormal findings of blood chemistry: Secondary | ICD-10-CM | POA: Diagnosis present

## 2015-09-09 DIAGNOSIS — R946 Abnormal results of thyroid function studies: Secondary | ICD-10-CM

## 2015-09-09 DIAGNOSIS — R778 Other specified abnormalities of plasma proteins: Secondary | ICD-10-CM | POA: Diagnosis present

## 2015-09-09 DIAGNOSIS — M353 Polymyalgia rheumatica: Secondary | ICD-10-CM

## 2015-09-09 DIAGNOSIS — K8020 Calculus of gallbladder without cholecystitis without obstruction: Secondary | ICD-10-CM

## 2015-09-09 DIAGNOSIS — Z9689 Presence of other specified functional implants: Secondary | ICD-10-CM | POA: Insufficient documentation

## 2015-09-09 DIAGNOSIS — D62 Acute posthemorrhagic anemia: Secondary | ICD-10-CM

## 2015-09-09 DIAGNOSIS — I483 Typical atrial flutter: Secondary | ICD-10-CM

## 2015-09-09 DIAGNOSIS — I4892 Unspecified atrial flutter: Secondary | ICD-10-CM

## 2015-09-09 LAB — BASIC METABOLIC PANEL
ANION GAP: 11 (ref 5–15)
BUN: 11 mg/dL (ref 6–20)
CALCIUM: 7.3 mg/dL — AB (ref 8.9–10.3)
CO2: 16 mmol/L — AB (ref 22–32)
CREATININE: 1.39 mg/dL — AB (ref 0.61–1.24)
Chloride: 110 mmol/L (ref 101–111)
GFR calc Af Amer: 55 mL/min — ABNORMAL LOW (ref >60)
GFR calc non Af Amer: 48 mL/min — ABNORMAL LOW (ref >60)
Glucose, Bld: 256 mg/dL — ABNORMAL HIGH (ref 65–99)
Potassium: 4.5 mmol/L (ref 3.5–5.1)
Sodium: 137 mmol/L (ref 135–145)

## 2015-09-09 LAB — GLUCOSE, CAPILLARY
GLUCOSE-CAPILLARY: 120 mg/dL — AB (ref 65–99)
GLUCOSE-CAPILLARY: 141 mg/dL — AB (ref 65–99)
GLUCOSE-CAPILLARY: 170 mg/dL — AB (ref 65–99)
Glucose-Capillary: 185 mg/dL — ABNORMAL HIGH (ref 65–99)
Glucose-Capillary: 270 mg/dL — ABNORMAL HIGH (ref 65–99)
Glucose-Capillary: 97 mg/dL (ref 65–99)

## 2015-09-09 LAB — CBC
HCT: 32.5 % — ABNORMAL LOW (ref 39.0–52.0)
Hemoglobin: 10.6 g/dL — ABNORMAL LOW (ref 13.0–17.0)
MCH: 31.8 pg (ref 26.0–34.0)
MCHC: 32.6 g/dL (ref 30.0–36.0)
MCV: 97.6 fL (ref 78.0–100.0)
PLATELETS: 257 10*3/uL (ref 150–400)
RBC: 3.33 MIL/uL — ABNORMAL LOW (ref 4.22–5.81)
RDW: 15.9 % — AB (ref 11.5–15.5)
WBC: 8.9 10*3/uL (ref 4.0–10.5)

## 2015-09-09 LAB — TROPONIN I
Troponin I: 0.04 ng/mL — ABNORMAL HIGH (ref <0.031)
Troponin I: 0.03 ng/mL (ref <0.031)
Troponin I: <0.03 ng/mL (ref <0.031)

## 2015-09-09 LAB — URINE CULTURE

## 2015-09-09 LAB — MAGNESIUM: Magnesium: 2 mg/dL (ref 1.7–2.4)

## 2015-09-09 LAB — PROCALCITONIN: Procalcitonin: 0.44 ng/mL

## 2015-09-09 LAB — PROTIME-INR
INR: 1.47 (ref 0.00–1.49)
Prothrombin Time: 17.9 seconds — ABNORMAL HIGH (ref 11.6–15.2)

## 2015-09-09 MED ORDER — MORPHINE SULFATE (PF) 4 MG/ML IV SOLN
4.0000 mg | Freq: Once | INTRAVENOUS | Status: AC
  Administered 2015-09-09: 4 mg via INTRAVENOUS
  Filled 2015-09-09: qty 1

## 2015-09-09 MED ORDER — NITROGLYCERIN 0.4 MG SL SUBL
SUBLINGUAL_TABLET | SUBLINGUAL | Status: AC
  Administered 2015-09-09: 0.4 mg
  Filled 2015-09-09: qty 1

## 2015-09-09 MED ORDER — AMIODARONE HCL 200 MG PO TABS
200.0000 mg | ORAL_TABLET | Freq: Two times a day (BID) | ORAL | Status: DC
  Administered 2015-09-09 – 2015-09-11 (×4): 200 mg via ORAL
  Filled 2015-09-09 (×4): qty 1

## 2015-09-09 MED ORDER — WARFARIN SODIUM 10 MG PO TABS: 10.0000 mg | ORAL_TABLET | Freq: Once | ORAL | Status: DC

## 2015-09-09 MED ORDER — MORPHINE SULFATE (PF) 4 MG/ML IV SOLN
INTRAVENOUS | Status: AC
Start: 2015-09-09 — End: 2015-09-09
  Filled 2015-09-09: qty 1

## 2015-09-09 MED ORDER — GI COCKTAIL ~~LOC~~
30.0000 mL | Freq: Once | ORAL | Status: AC
Start: 2015-09-09 — End: 2015-09-09
  Administered 2015-09-09: 30 mL via ORAL
  Filled 2015-09-09: qty 30

## 2015-09-09 MED ORDER — MORPHINE SULFATE (PF) 2 MG/ML IV SOLN
INTRAVENOUS | Status: AC
  Filled 2015-09-09: qty 1

## 2015-09-09 MED ORDER — MORPHINE SULFATE (PF) 2 MG/ML IV SOLN: 2.0000 mg | Freq: Once | INTRAVENOUS | Status: AC

## 2015-09-09 MED ORDER — WARFARIN SODIUM 7.5 MG PO TABS
7.5000 mg | ORAL_TABLET | Freq: Once | ORAL | Status: AC
  Administered 2015-09-09: 7.5 mg via ORAL
  Filled 2015-09-09: qty 1

## 2015-09-09 MED ORDER — MORPHINE SULFATE (PF) 4 MG/ML IV SOLN
4.0000 mg | Freq: Once | INTRAVENOUS | Status: AC
  Administered 2015-09-09: 4 mg via INTRAVENOUS

## 2015-09-09 MED ORDER — METOPROLOL TARTRATE 1 MG/ML IV SOLN
INTRAVENOUS | Status: AC
  Filled 2015-09-09: qty 5

## 2015-09-09 MED ORDER — NITROGLYCERIN 0.4 MG SL SUBL
0.4000 mg | SUBLINGUAL_TABLET | Freq: Once | SUBLINGUAL | Status: DC
Start: 2015-09-09 — End: 2015-09-16

## 2015-09-09 MED ORDER — INSULIN ASPART 100 UNIT/ML ~~LOC~~ SOLN
0.0000 [IU] | Freq: Three times a day (TID) | SUBCUTANEOUS | Status: DC
Start: 2015-09-09 — End: 2015-09-16
  Administered 2015-09-09: 5 [IU] via SUBCUTANEOUS
  Administered 2015-09-10 (×2): 2 [IU] via SUBCUTANEOUS
  Administered 2015-09-10: 5 [IU] via SUBCUTANEOUS
  Administered 2015-09-11 (×2): 2 [IU] via SUBCUTANEOUS
  Administered 2015-09-11: 5 [IU] via SUBCUTANEOUS
  Administered 2015-09-12 (×2): 3 [IU] via SUBCUTANEOUS
  Administered 2015-09-12: 2 [IU] via SUBCUTANEOUS
  Administered 2015-09-13: 5 [IU] via SUBCUTANEOUS
  Administered 2015-09-13: 2 [IU] via SUBCUTANEOUS
  Administered 2015-09-14 (×2): 3 [IU] via SUBCUTANEOUS
  Administered 2015-09-14: 5 [IU] via SUBCUTANEOUS
  Administered 2015-09-15 (×2): 7 [IU] via SUBCUTANEOUS
  Administered 2015-09-15 – 2015-09-16 (×2): 5 [IU] via SUBCUTANEOUS
  Administered 2015-09-16: 7 [IU] via SUBCUTANEOUS

## 2015-09-09 MED ORDER — MORPHINE SULFATE (PF) 2 MG/ML IV SOLN
INTRAVENOUS | Status: AC
  Administered 2015-09-09: 2 mg via INTRAVENOUS
  Filled 2015-09-09: qty 1

## 2015-09-09 MED ORDER — METOPROLOL TARTRATE 1 MG/ML IV SOLN
2.5000 mg | Freq: Once | INTRAVENOUS | Status: AC
Start: 2015-09-09 — End: 2015-09-09
  Administered 2015-09-09: 2.5 mg via INTRAVENOUS

## 2015-09-09 MED ORDER — LEVOFLOXACIN 750 MG PO TABS
750.0000 mg | ORAL_TABLET | ORAL | Status: AC
  Administered 2015-09-09 – 2015-09-13 (×5): 750 mg via ORAL
  Filled 2015-09-09 (×5): qty 1

## 2015-09-09 MED ORDER — DILTIAZEM HCL ER COATED BEADS 120 MG PO CP24
120.0000 mg | ORAL_CAPSULE | Freq: Every day | ORAL | Status: DC
  Administered 2015-09-09 – 2015-09-10 (×2): 120 mg via ORAL
  Filled 2015-09-09 (×3): qty 1

## 2015-09-09 MED ORDER — MORPHINE SULFATE (PF) 2 MG/ML IV SOLN
2.0000 mg | Freq: Once | INTRAVENOUS | Status: AC
  Administered 2015-09-09: 2 mg via INTRAVENOUS

## 2015-09-09 MED ORDER — MAGNESIUM SULFATE 2 GM/50ML IV SOLN
2.0000 g | Freq: Once | INTRAVENOUS | Status: AC
  Administered 2015-09-09: 2 g via INTRAVENOUS
  Filled 2015-09-09: qty 50

## 2015-09-09 NOTE — Progress Notes (Addendum)
Patient ID: Curtis Frazier, male   DOB: [birth year], 75 y.o.   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 Patient also has history of lumbar incision status post laminectomy earlier in February 2016. He was just discharged 3/27 after being in hospital for chest pain evaluation. Cardiology was consulted at that time and their recommendation was to continue medical management because patient's chest pain resolved.  He presented to [facility] with reports of epigastric and chest pain that radiates to the back, 10 out of 10 in intensity and with associated shortness of breath especially after ambulation.  On admission, he was hemodynamically stable. Initial thought was that he may have gallbladder related pain. HIDA scan was done and showed no acute cholecystitis. Liver enzymes are all within normal limits.  Overnight, patient complained of severe chest pain. He does have minimal elevation in troponin which seems to have been present even on past admission, 0.04. History of lead EKG last night showed atrial flutter. Cardiology consulted for input on management.   Assessment/Plan:    Principal Problem: Chest pain with exertion / mild troponin elevation - Rule out ACS although as mentioned he did have minimal elevation in troponin even last admission - Cycle cardiac enzymes - 2-D echo on this admission with ejection fraction of [id number], grade 1 diastolic dysfunction, normal wall motion - Appreciate cardiology consult and recommendations - Chest pain seems to be better with low-dose morphine - Patient is on anticoagulation with Coumadin, INR subtherapeutic, 1.47. - Monitor on telemetry  Active Problems: Atrial fibrillation,  chronic - Chads Vasc score 5 - On anticoagulation with Coumadin, dosing per pharmacy - Rate controlled with amiodarone  Rule out pneumonia - Awaiting results from chest x-ray this morning - He did have mild elevation in broke calcitonin but normal white blood cell count and no fevers however patient concerned that he may be developing pneumonia with intermittent cough - Started Levaquin, dosing per pharmacy but low threshold to stop his chest x-ray normal  Symptomatic cholelithiasis - HIDA scan normal - LFTs WNL - Tolerates liquid det - Advance diet to regular - Since no acute cholecystitis surgery has signed off   History of laminectomy - Appreciate wound care assessment. - Wound measurement: 1cm x 0.3cm x 0.5cm  - Per wound care, wound has not yet healed at this point. The patient is past 2 week follow-up with neurosurgery. Will let the Dr. Arnoldo Morale of neurosurgery know patient's admission  Rheumatoid arthritis ([place]) / PMR (polymyalgia rheumatica) (HCC) - Continue prednisone  Anemia of chronic disease  - Related to history of rheumatoid arthritis and polymyalgia rheumatica as well as chronic kidney disease   Chronic kidney disease stage III - Recent baseline creatinine 1.8 and on this admission 1.3, within baseline range   DVT Prophylaxis  - Lovenox subQ ordered    Code Status: Full.  Family Communication:  plan of care discussed with the patient Disposition Plan: home once medically stable, once his CP controlled, anticipate he will stay in hospital at least through 4/1  IV access:  Peripheral IV  Procedures and diagnostic studies:    Nm Hepatobiliary Liver Func 09/08/2015   No evidence of cystic duct or common bile duct obstruction. Electronically Signed  By: Rolm Baptise M.D.   On: 09/08/2015 09:24   Medical Consultants:  Surgery Cardiology  Other Consultants:  None  IAnti-Infectives:   Levaquin 09/09/2015 -->    Leisa Lenz, MD  [facility] Pager [phone]  Time spent in minutes: 25 minutes  If 7PM-7AM, please contact night-coverage [email or web address] Password [email or web address] 09/09/2015, 9:10 AM   LOS: 2 days    HPI/Subjective: No acute overnight events. Had chest pain last night and this morning. Seems to be low more short of breath after exertion.  Objective: Filed Vitals:   09/08/15 1949 09/09/15 0330 09/09/15 0441 09/09/15 0755  BP: 101/62 128/84 116/75 125/70  Pulse: 112 118 117 128  Temp: 98.5 F (36.9 C)  98.5 F (36.9 C)   TempSrc: Oral  Oral   Resp: 20 20 21    Height:      Weight:      SpO2: 96% 98% 94% 95%    Intake/Output Summary (Last 24 hours) at 09/09/15 0910 Last data filed at 09/09/15 0701  Gross per 24 hour  Intake    900 ml  Output      0 ml  Net    900 ml    Exam:   General:  Pt is alert, not in acute distress  Cardiovascular: Tachycardic, appreciate S1, S2   Respiratory: Diminished breath sounds but no wheezing  Abdomen: Appreciate bowel sounds, nontender abdomen  Extremities: No lower extremity swelling, palpable pulses  Neuro: No focal neurological deficits  Data Reviewed: Basic Metabolic Panel:  Recent Labs Lab 09/05/15 2136 09/07/15 1037 09/07/15 1450 09/07/15 1826 09/08/15 0326  NA 137 135 134*  --  138  K 4.7 4.4 4.3  --  4.6  CL 107 105 108  --  114*  CO2 21* 21* 18*  --  16*  GLUCOSE 216* 199* 140*  --  145*  BUN 21* 15 16  --  12  CREATININE 1.82* 1.66* 1.62*  --  1.35*  CALCIUM 8.7* 8.1* 7.8*  --  7.1*  MG  --   --   --  1.4*  --    Liver Function Tests:  Recent Labs Lab 09/07/15 1037 09/07/15 1450 09/08/15 0326  AST 16 16 18   ALT 19 17 15*  ALKPHOS 55 50 49  BILITOT 0.9 0.9 0.9  PROT 5.8* 5.3* 4.6*  ALBUMIN 2.5* 2.3* 2.1*    Recent Labs Lab 09/07/15 1037  LIPASE 26   No results for input(s): AMMONIA in the last 168 hours. CBC:  Recent Labs Lab 09/05/15 2136 09/07/15 1037 09/07/15 1450 09/08/15 0326  WBC 9.8 9.8 6.7 7.9   NEUTROABS  --   --  4.5  --   HGB 11.0* 10.1* 13.3 12.4*  HCT 33.8* 32.5* 41.4 37.9*  MCV 98.8 98.2 97.2 98.4  PLT 256 247 154 168   Cardiac Enzymes:  Recent Labs Lab 09/06/15 1525 09/07/15 1037 09/07/15 1417 09/07/15 2343 09/09/15 0426  TROPONINI <0.03 0.04* 0.04* 0.03 0.04*   BNP: Invalid input(s): POCBNP CBG:  Recent Labs Lab 09/08/15 1746 09/08/15 2040 09/09/15 0005 09/09/15 0411 09/09/15 0805  GLUCAP 259* 103* 97 120* 141*    Recent Results (from the past 240 hour(s))  Blood Culture (routine x 2)     Status: None (Preliminary result)   Collection Time: 09/07/15  2:40 PM  Result Value Ref Range Status   Specimen Description BLOOD RIGHT HAND  Final   Special Requests BOTTLES DRAWN AEROBIC AND ANAEROBIC 5CC  Final  Culture NO GROWTH < 24 HOURS  Final   Report Status PENDING  Incomplete  Blood Culture (routine x 2)     Status: None (Preliminary result)   Collection Time: 09/07/15  2:50 PM  Result Value Ref Range Status   Specimen Description BLOOD LEFT ANTECUBITAL  Final   Special Requests BOTTLES DRAWN AEROBIC AND ANAEROBIC 5CC  Final   Culture NO GROWTH < 24 HOURS  Final   Report Status PENDING  Incomplete  Urine culture     Status: None (Preliminary result)   Collection Time: 09/07/15  5:20 PM  Result Value Ref Range Status   Specimen Description URINE, CLEAN CATCH  Final   Special Requests NONE  Final   Culture NO GROWTH < 24 HOURS  Final   Report Status PENDING  Incomplete     Scheduled Meds: . amiodarone  200 mg Oral Daily  . collagenase   Topical Daily  . enoxaparin (LOVENOX) injection  40 mg Subcutaneous QHS  . insulin aspart  0-9 Units Subcutaneous 6 times per day  .  morphine injection  2 mg Intravenous Once  . morphine      . nitroGLYCERIN  0.4 mg Sublingual Once  . pantoprazole  40 mg Oral Daily  . predniSONE  10 mg Oral Q breakfast  . sodium chloride flush  3 mL Intravenous Q12H  . thiamine  100 mg Oral Daily  . Warfarin - Pharmacist  Dosing Inpatient   Does not apply q1800   Continuous Infusions: . sodium chloride 75 mL/hr at 09/09/15 0815

## 2015-09-09 NOTE — Progress Notes (Signed)
ANTICOAGULATION CONSULT NOTE - Follow Up Consult  Pharmacy Consult for Coumadin Indication: atrial fibrillation  Allergies  Allergen Reactions  . Ace Inhibitors Other (See Comments)    Causes high K  . Angiotensin Receptor Blockers Other (See Comments)    Causes high K  . Metoprolol Other (See Comments)    May cause elevated K level  . Penicillins Swelling and Rash    Has patient had a PCN reaction causing immediate rash, facial/tongue/throat swelling, SOB or lightheadedness with hypotension: YES Has patient had a PCN reaction causing severe rash involving mucus membranes or skin necrosis: NO Has patient had a PCN reaction that required hospitalization NO Has patient had a PCN reaction occurring within the last 10 years: NO If all of the above answers are "NO", then may proceed with Cephalosporin use.  . Tetracycline Swelling and Rash    Patient Measurements: Height: 5\' 8"  (172.7 cm) Weight: 180 lb 8.9 oz (81.9 kg) IBW/kg (Calculated) : 68.4   Vital Signs: Temp: 98.5 F (36.9 C) (03/30 0441) Temp Source: Oral (03/30 0441) BP: 125/70 mmHg (03/30 0755) Pulse Rate: 128 (03/30 0755)  Labs:  Recent Labs  09/07/15 1037 09/07/15 1304 09/07/15 1417 09/07/15 1450 09/07/15 2343 09/08/15 0326 09/09/15 0426  HGB 10.1*  --   --  13.3  --  12.4*  --   HCT 32.5*  --   --  41.4  --  37.9*  --   PLT 247  --   --  154  --  168  --   LABPROT  --  15.8*  --   --   --  17.8* 17.9*  INR  --  1.25  --   --   --  1.45 1.47  CREATININE 1.66*  --   --  1.62*  --  1.35*  --   TROPONINI 0.04*  --  0.04*  --  0.03  --  0.04*    Estimated Creatinine Clearance: 45 mL/min (by C-G formula based on Cr of 1.35).   Assessment: 68 yom with history of aFib on warfarin PTA. Pharmacy consulted to dose warfarin, held 3/28. Surgery note says no lap chole required this admission, restarted warfarin on 3/29 + lovenox 40mg  [facility]. INR stable 1.47. Hg up to 12.4, plt wnl. No bleed. Also on amio and  levofloxacin - can affect INR.  -- PTA warf: 10 mg x2 days then 5 mg daily (new start?)  Goal of Therapy:  INR 2-3 Monitor platelets by anticoagulation protocol: Yes   Plan:  Coumadin 7.5mg  po x 1 tonight Lovenox 40mg  [place] [facility] - dc when INR therapeutic Daily INR Monitor s/sx bleeding  Elicia Lamp, PharmD, BCPS Clinical Pharmacist Pager [phone] 09/09/2015 10:36 AM

## 2015-09-09 NOTE — Discharge Instructions (Signed)
Information on my medicine - Coumadin   (Warfarin)  This medication education was reviewed with me or my healthcare representative as part of my discharge preparation.  The pharmacist that spoke with me during my hospital stay was:  Romona Curls, [facility]  Why was Coumadin prescribed for you? Coumadin was prescribed for you because you have a blood clot or a medical condition that can cause an increased risk of forming blood clots. Blood clots can cause serious health problems by blocking the flow of blood to the heart, lung, or brain. Coumadin can prevent harmful blood clots from forming. As a reminder your indication for Coumadin is:   Stroke Prevention Because Of Atrial Fibrillation  What test will check on my response to Coumadin? While on Coumadin (warfarin) you will need to have an INR test regularly to ensure that your dose is keeping you in the desired range. The INR (international normalized ratio) number is calculated from the result of the laboratory test called prothrombin time (PT).  If an INR APPOINTMENT HAS NOT ALREADY BEEN MADE FOR YOU please schedule an appointment to have this lab work done by your health care provider within 7 days. Your INR goal is usually a number between:  2 to 3 or your provider may give you a more narrow range like 2-2.5.  Ask your health care provider during an office visit what your goal INR is.  What  do you need to  know  About  COUMADIN? Take Coumadin (warfarin) exactly as prescribed by your healthcare provider about the same time each day.  DO NOT stop taking without talking to the doctor who prescribed the medication.  Stopping without other blood clot prevention medication to take the place of Coumadin may increase your risk of developing a new clot or stroke.  Get refills before you run out.  What do you do if you miss a dose? If you miss a dose, take it as soon as you remember on the same day then continue your regularly scheduled regimen the next  day.  Do not take two doses of Coumadin at the same time.  Important Safety Information A possible side effect of Coumadin (Warfarin) is an increased risk of bleeding. You should call your healthcare provider right away if you experience any of the following: ? Bleeding from an injury or your nose that does not stop. ? Unusual colored urine (red or dark brown) or unusual colored stools (red or black). ? Unusual bruising for unknown reasons. ? A serious fall or if you hit your head (even if there is no bleeding).  Some foods or medicines interact with Coumadin (warfarin) and might alter your response to warfarin. To help avoid this: ? Eat a balanced diet, maintaining a consistent amount of Vitamin K. ? Notify your provider about major diet changes you plan to make. ? Avoid alcohol or limit your intake to 1 drink for women and 2 drinks for men per day. (1 drink is 5 oz. wine, 12 oz. beer, or 1.5 oz. liquor.)  Make sure that ANY health care provider who prescribes medication for you knows that you are taking Coumadin (warfarin).  Also make sure the healthcare provider who is monitoring your Coumadin knows when you have started a new medication including herbals and non-prescription products.  Coumadin (Warfarin)  Major Drug Interactions  Increased Warfarin Effect Decreased Warfarin Effect  Alcohol (large quantities) Antibiotics (esp. Septra/Bactrim, Flagyl, Cipro) Amiodarone (Cordarone) Aspirin (ASA) Cimetidine (Tagamet) Megestrol (Megace) NSAIDs (ibuprofen,  naproxen, etc.) °Piroxicam (Feldene) °Propafenone (Rythmol SR) °Propranolol (Inderal) °Isoniazid (INH) °Posaconazole (Noxafil) Barbiturates (Phenobarbital) °Carbamazepine (Tegretol) °Chlordiazepoxide (Librium) °Cholestyramine (Questran) °Griseofulvin °Oral Contraceptives °Rifampin °Sucralfate (Carafate) °Vitamin K  ° °Coumadin® (Warfarin) Major Herbal Interactions  °Increased Warfarin Effect Decreased Warfarin Effect   °Garlic °Ginseng °Ginkgo biloba Coenzyme Q10 °Green tea °St. John’s wort   ° °Coumadin® (Warfarin) FOOD Interactions  °Eat a consistent number of servings per week of foods HIGH in Vitamin K °(1 serving = ½ cup)  °Collards (cooked, or boiled & drained) °Kale (cooked, or boiled & drained) °Mustard greens (cooked, or boiled & drained) °Parsley *serving size only = ¼ cup °Spinach (cooked, or boiled & drained) °Swiss chard (cooked, or boiled & drained) °Turnip greens (cooked, or boiled & drained)  °Eat a consistent number of servings per week of foods MEDIUM-HIGH in Vitamin K °(1 serving = 1 cup)  °Asparagus (cooked, or boiled & drained) °Broccoli (cooked, boiled & drained, or raw & chopped) °Brussel sprouts (cooked, or boiled & drained) *serving size only = ½ cup °Lettuce, raw (green leaf, endive, romaine) °Spinach, raw °Turnip greens, raw & chopped  ° °These websites have more information on Coumadin (warfarin):  www.coumadin.com; °www.ahrq.gov/consumer/coumadin.htm; ° ° ° °

## 2015-09-09 NOTE — Consult Note (Addendum)
CARDIOLOGY CONSULT NOTE   Patient ID: Curtis Frazier MRN: [id number], DOB/AGE: 01/13/1941   Admit date: 09/07/2015 Date of Consult: 09/09/2015   Primary Physician: Loura Pardon, MD Primary Cardiologist: Dr. Johnsie Cancel  Pt. Profile  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 Other and unspecified hyperlipidemia   . Coronary atherosclerosis of unspecified type of vessel, native or graft   . Personal history of colonic polyps   . Actinic keratosis   . 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 Other psoriasis   . Scoliosis (and kyphoscoliosis), idiopathic   . GI bleed 12/10    cecam AVM  . Gastritis 12/10  . Hyperlipidemia     takes Pravastatin daily  . Hypertension     takes Metoprolol daily  . Shortness of breath     with exertion  . Headache(784.0)     occasionally  . Joint pain   . Joint swelling   . Chronic back pain     scoliosis/stenosis/radiculopathy,degenerative disc disease  . Psoriasis   . Bruises easily   . Esophageal reflux     takes Omeprazole daily  . History of colonic polyps   . Hemorrhoids   . Kidney stone     hx of  . Blood transfusion 2010  . PMR (polymyalgia rheumatica) (HCC) 01/20/2012    tx by specialist on a long prednisone taper  . S/P PTCA (percutaneous transluminal coronary angioplasty)   . Rheumatoid arthritis(714.0)     takes Methotrexate 7pills weekly  . Degeneration of intervertebral disc, site unspecified   . Myocardial infarction ([place]) 2010  . Pneumonia 2008; 07/2015  . Type II or unspecified type diabetes mellitus without mention of complication, not stated as uncontrolled   . Skin cancer     "arms"  . CHF (congestive heart failure) [facility])     Past Surgical History  Procedure Laterality  Date  . Rotator cuff repair Left 2000  . Colonoscopy    . Vasectomy  1979  . Coronary angioplasty with stent placement  02/2009    has one stent  . Carotid doppler  10/12    0-39% R and 60-79% left   . Cataract extraction  2012  . Cardiac catheterization  2010  . Lithotripsy  2009  . Tee without cardioversion N/A 08/06/2015    Procedure: TRANSESOPHAGEAL ECHOCARDIOGRAM (TEE);  Surgeon: Skeet Latch, MD;  Location: [facility];  Service: Cardiovascular;  Laterality: N/A;  . Cardioversion N/A 08/06/2015    Procedure: CARDIOVERSION;  Surgeon: Skeet Latch, MD;  Location: [facility] ENDOSCOPY;  Service: Cardiovascular;  Laterality: N/A;  . Back surgery    . Lumbar laminectomy  07/2015  . Posterior laminectomy thoracic spine      "fixed the discs"  . Skin cancer excision      "arms"     Allergies  Allergies  Allergen Reactions  . Ace Inhibitors Other (See Comments)    Causes high K  . Angiotensin Receptor Blockers Other (See Comments)    Causes high K  . Metoprolol Other (See Comments)    May cause elevated K level  . Penicillins Swelling and Rash    Has patient had a PCN reaction causing immediate rash, facial/tongue/throat swelling, SOB or lightheadedness with hypotension:  YES Has patient had a PCN reaction causing severe rash involving mucus membranes or skin necrosis: NO Has patient had a PCN reaction that required hospitalization NO Has patient had a PCN reaction occurring within the last 10 years: NO If all of the above answers are "NO", then may proceed with Cephalosporin use.  . Tetracycline Swelling and Rash    HPI   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 He had an normal Myoview on 05/12/2015 which showed EF 65%, fixed defect in apex location most consistent with mild apical thinning, low  risk study. He had a prolonged hospital stay in February due to hypoxia, acute respiratory distress, and pneumonia. He had to be intubated at the time. He was later discharged to inpatient cardiac rehabilitation and eventually released on 09/02/2015. He returned to the hospital on 3/26 with chest pain. Cardiology was consulted at a time, given the atypical nature of the chest discomfort worse with deep inspiration, medical therapy was recommended. Per Dr. Marlou Porch, if continued to have further symptoms, may consider other workups at a later time. He was eventually discharged on 3/27, unfortunately he came back on the following day with recurrent chest discomfort. He also complained of epigastric pain as well. Surgery was consulted who diagnosed patient with cholelithiasis, however no obvious cholecystitis on HIDA scan. No further surgical evaluation was felt to be necessary.   As for his chest pain, He appears to be in sinus tachycardia on arrival. Serial troponin was borderline at 0.03 and 0.04. For the past 2 days, it is unclear when the patient went into atrial flutter, cardiology consulted today for chest discomfort, at which time patient appears to be back in atrial flutter. He is on low-dose amiodarone which was decreased on the last cardiology consult by Dr. Marlou Porch down to 200 mg daily. He is not on any rate control medication. He was given IV Lopressor last night to help control the heart rate better. His chest discomfort has been coming and going. He described his chest pain as a sharp stabbing pain radiating to the back. However the location of the chest pain has been changing, when he arrived in the hospital during this admission he described as mid chest radiating to the back. The chest pain recurred this morning at 3 AM, however it appears to be a sharp pain more on the left side of the chest also radiated to the back. It appears patient had a CTA of the chest on 09/06/2015 which showed small residual  pericardial effusion, resolution of the pleural effusion, no acute vascular process. Previous CT without contrast in February 2017 showed diffuse coronary calcifications consistent with his prior history of CAD. Cardiology has been consulted for chest pain. He states the chest pain is worse with deep inspiration and it can last hours at a time. He denies any worsening of chest discomfort with rotation or palpation.   Inpatient Medications  . amiodarone  200 mg Oral Daily  . collagenase   Topical Daily  . enoxaparin (LOVENOX) injection  40 mg Subcutaneous QHS  . insulin aspart  0-9 Units Subcutaneous 6 times per day  . levofloxacin  750 mg Oral Q24H  . magnesium sulfate 1 - 4 g bolus IVPB  2 g Intravenous Once  . morphine      .  nitroGLYCERIN  0.4 mg Sublingual Once  . pantoprazole  40 mg Oral Daily  . predniSONE  10 mg Oral Q breakfast  . sodium chloride flush  3 mL Intravenous Q12H  . thiamine  100 mg Oral Daily  . warfarin  7.5 mg Oral ONCE-1800  . Warfarin - Pharmacist Dosing Inpatient   Does not apply q1800    Family History Family History  Problem Relation Age of Onset  . Coronary artery disease Mother   . Diabetes Mother   . CAD Mother   . Coronary artery disease Sister   . Hypertension Sister   . Kidney failure Sister   . CAD Sister   . Diabetes Brother   . Prostate cancer Brother   . Pancreatic cancer Brother   . Diabetes Brother   . Anesthesia problems Neg Hx   . Hypotension Neg Hx   . Malignant hyperthermia Neg Hx   . Pseudochol deficiency Neg Hx      Social History Social History   Social History  . Marital Status: Married    Spouse Name: N/A  . Number of Children: N/A  . Years of Education: N/A   Occupational History  . Not on file.   Social History Main Topics  . Smoking status: Former Smoker -- 1.50 packs/day for 53 years    Types: Cigarettes  . Smokeless tobacco: Never Used     Comment: quit smoking cigarettes in 2010  . Alcohol Use: 0.0  oz/week    0 Standard drinks or equivalent per week     Comment: 08/11/2015 "might have 1-2 margaritas/month in the summertime"  . Drug Use: No  . Sexual Activity: No   Other Topics Concern  . Not on file   Social History Narrative     Review of Systems  General:  No chills, fever, night sweats or weight changes.  Cardiovascular:  No dyspnea on exertion, edema, orthopnea, palpitations, paroxysmal nocturnal dyspnea. +chest pain Dermatological: No rash, lesions/masses Respiratory: No cough, dyspnea Urologic: No hematuria, dysuria Abdominal:   No nausea, vomiting, diarrhea, bright red blood per rectum, melena, or hematemesis + abdominal discomfort  Neurologic:  No visual changes, wkns, changes in mental status. All other systems reviewed and are otherwise negative except as noted above.  Physical Exam  Blood pressure 125/70, pulse 128, temperature 98.5 F (36.9 C), temperature source Oral, resp. rate 21, height 5\' 8"  (1.727 m), weight 180 lb 8.9 oz (81.9 kg), SpO2 95 %.  General: Pleasant, NAD Psych: Normal affect. Neuro: Alert and oriented X 3. Moves all extremities spontaneously. HEENT: Normal  Neck: Supple without bruits or JVD. Lungs:  Resp regular and unlabored, CTA. Heart: RRR no s3, s4, or murmurs. Abdomen: Soft, non-tender, non-distended, BS + x 4.  Extremities: No clubbing, cyanosis or edema. DP/PT/Radials 2+ and equal bilaterally.  Labs   Recent Labs  09/07/15 1037 09/07/15 1417 09/07/15 2343 09/09/15 0426  TROPONINI 0.04* 0.04* 0.03 0.04*   Lab Results  Component Value Date   WBC 8.9 09/09/2015   HGB 10.6* 09/09/2015   HCT 32.5* 09/09/2015   MCV 97.6 09/09/2015   PLT 257 09/09/2015    Recent Labs Lab 09/08/15 0326  NA 138  K 4.6  CL 114*  CO2 16*  BUN 12  CREATININE 1.35*  CALCIUM 7.1*  PROT 4.6*  BILITOT 0.9  ALKPHOS 49  ALT 15*  AST 18  GLUCOSE 145*   Lab Results  Component Value Date   CHOL 155 09/06/2015   HDL  43 09/06/2015    LDLCALC 100* 09/06/2015   TRIG 61 09/06/2015   No results found for: DDIMER  Radiology/Studies  Dg Chest 2 View  09/07/2015  CLINICAL DATA:  Chest pain EXAM: CHEST  2 VIEW COMPARISON:  09/05/2015 FINDINGS: Cardiac shadow is stable. The lungs are well aerated bilaterally. No focal infiltrate is noted. No sizable effusion is seen. Mild interstitial changes are noted stable from the previous exam. Postsurgical changes are noted. Mid thoracic compression deformity is again noted and stable. IMPRESSION: No acute abnormality noted. Electronically Signed   By: Inez Catalina M.D.   On: 09/07/2015 10:39   Dg Chest 2 View  09/05/2015  CLINICAL DATA:  Acute onset of mid chest pain, radiating to the back. Initial encounter. EXAM: CHEST  2 VIEW COMPARISON:  Chest radiograph from 08/17/2015 FINDINGS: The lungs are well-aerated. Peribronchial thickening is noted, with mild bilateral atelectasis or scarring. There is no evidence of pleural effusion or pneumothorax. The heart is borderline normal in size. No acute osseous abnormalities are seen. Thoracolumbar spinal fusion hardware is noted. Chronic compression deformities are noted at the mid thoracic spine. IMPRESSION: Peribronchial thickening, with mild bilateral atelectasis or scarring. Lungs otherwise grossly clear. Electronically Signed   By: Garald Balding M.D.   On: 09/05/2015 21:19   Dg Chest 2 View  08/17/2015  CLINICAL DATA:  Cough.  Recent back surgery. EXAM: CHEST  2 VIEW COMPARISON:  08/12/2015 FINDINGS: The heart size and mediastinal contours are within normal limits. Mild subsegmental atelectasis in the left lower lung shows no significant change. No evidence of pneumothorax or pleural effusion. No evidence of pulmonary consolidation or edema. Right arm PICC line is seen in appropriate position. Posterior thoracolumbar spine fusion hardware again noted. IMPRESSION: Mild left lower lobe atelectasis, without significant change. No evidence of pulmonary  consolidation or pleural effusion. Electronically Signed   By: Earle Gell M.D.   On: 08/17/2015 18:19   Mr Brain Wo Contrast  08/12/2015  CLINICAL DATA:  Acute encephalopathy. Possibly from medication. Altered mental status. EXAM: MRI HEAD WITHOUT CONTRAST TECHNIQUE: Multiplanar, multiecho pulse sequences of the brain and surrounding structures were obtained without intravenous contrast. COMPARISON:  CT head 07/26/2015. FINDINGS: No evidence for acute infarction, hemorrhage, mass lesion, hydrocephalus, or extra-axial fluid. Generalized atrophy. Moderate T2 and FLAIR hyperintensity throughout the white matter, likely chronic microvascular ischemic change. Flow voids are maintained throughout the carotid, basilar, and vertebral arteries. There are no areas of chronic hemorrhage. Pituitary, pineal, and cerebellar tonsils unremarkable. No upper cervical lesions. BILATERAL mastoid effusions without nasopharyngeal lesion, likely secondary to recumbency, although given the persistence since previous CT, BILATERAL mastoiditis not excluded. 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 BILATERAL cataract extraction. IMPRESSION: No acute intracranial findings.  Atrophy and small vessel disease. BILATERAL mastoid fluid without nasopharyngeal lesion. Likely effusions. Correlate clinically for mastoiditis. Chronic and possibly acute RIGHT maxillary sinus disease. Electronically Signed   By: Staci Righter M.D.   On: 08/12/2015 14:00   Nm Hepatobiliary Liver Func  09/08/2015  CLINICAL DATA:  Epigastric pain.  Symptomatic cholelithiasis EXAM: NUCLEAR MEDICINE HEPATOBILIARY IMAGING TECHNIQUE: Sequential images of the abdomen were obtained out to 60 minutes following intravenous administration of radiopharmaceutical. RADIOPHARMACEUTICALS:  5.2 mCi Tc-60m  Choletec IV COMPARISON:  Ultrasound 09/07/2015 FINDINGS: There is prompt uptake and excretion of radiotracer by the liver. Activity is seen within small bowel and  gallbladder by 25-30 minutes. IMPRESSION: No evidence of cystic duct or common bile duct obstruction. Electronically Signed  By: Rolm Baptise M.D.   On: 09/08/2015 09:24   US Abdomen Limited  09/07/2015  CLINICAL DATA:  Right upper quadrant abdominal pain since last Friday. EXAM: US ABDOMEN LIMITED - RIGHT UPPER QUADRANT COMPARISON:  None. FINDINGS: Gallbladder: Multiple echogenic shadowing gallstones. The largest measures 9 mm. No gallbladder wall thickening, pericholecystic fluid or sonographic Murphy sign to suggest acute cholecystitis. Common bile duct: Diameter: 4.0 mm Liver: Normal echogenicity without focal lesion or biliary dilatation. IMPRESSION: Cholelithiasis without sonographic findings for acute cholecystitis. Normal caliber common bile duct and normal liver. Electronically Signed   By: Marijo Sanes M.D.   On: 09/07/2015 13:51   Dg Chest Port 1 View  08/12/2015  CLINICAL DATA:  Dyspnea, hypertension, pneumonia 2/17 EXAM: PORTABLE CHEST 1 VIEW COMPARISON:  08/10/2015 FINDINGS: Cardiomediastinal silhouette is stable. No infiltrate or pulmonary edema. Lower thoracic dextroscoliosis again noted. Again noted metallic fixation rods lower thoracic spine and lumbar spine. Right arm PICC line is unchanged in position. No infiltrate or pulmonary edema. Persistent linear atelectasis or scarring in lingula and left base. IMPRESSION: No infiltrate or pulmonary edema. Persistent linear atelectasis or scarring in left base. Electronically Signed   By: Lahoma Crocker M.D.   On: 08/12/2015 12:49   Portable Chest 1 View  08/10/2015  CLINICAL DATA:  Shortness of breath EXAM: PORTABLE CHEST 1 VIEW COMPARISON:  08/05/2015 FINDINGS: Low lung volumes with bibasilar atelectasis, worse on the left. Stable cardiomegaly without edema or effusion. No pneumothorax. Right PICC line tip mid SVC level. Residual scoliosis of the spine. Thoracolumbar fusion hardware partially imaged. Trachea is midline. IMPRESSION: Cardiomegaly  with basilar atelectasis, worse on the left. Electronically Signed   By: Jerilynn Mages.  Shick M.D.   On: 08/10/2015 18:35   Dg Abd 2 Views  09/07/2015  CLINICAL DATA:  Constipation and abdomen pain for 3 days EXAM: ABDOMEN - 2 VIEW COMPARISON:  None. FINDINGS: Spinal rods noted. Nonobstructive bowel gas pattern. Moderate fecal retention primarily through the mid colon. Trace lucency on the decubitus view over the edge of the liver. IMPRESSION: Mild to moderate fecal retention. Suggestion of possible free right upper quadrant. Would suggest CT of the abdomen and pelvis. Electronically Signed   By: Skipper Cliche M.D.   On: 09/07/2015 17:42   Dg Swallowing Func-speech Pathology  08/25/2015  Objective Swallowing Evaluation: Type of Study: MBS-Modified Barium Swallow Study Patient Details Name: SEIJI LETBETTER MRN: [id number] Date of Birth: [birth year] Today's Date: 08/25/2015 Time: SLP Start Time (ACUTE ONLY): 0940-SLP Stop Time (ACUTE ONLY): 1015 SLP Time Calculation (min) (ACUTE ONLY): 35 min Past Medical History: Past Medical History Diagnosis Date . Other and unspecified hyperlipidemia  . Coronary atherosclerosis of unspecified type of vessel, native or graft  . Personal history of colonic polyps  . Actinic keratosis  . Psoriatic arthropathy ([place])  . Other psoriasis  . Scoliosis (and kyphoscoliosis), idiopathic  . GI bleed 12/10   cecam AVM . Gastritis 12/10 . Hyperlipidemia    takes Pravastatin daily . Hypertension    takes Metoprolol daily . Shortness of breath    with exertion . Headache(784.0)    occasionally . Joint pain  . Joint swelling  . Chronic back pain    scoliosis/stenosis/radiculopathy,degenerative disc disease . Psoriasis  . Bruises easily  . Esophageal reflux    takes Omeprazole daily . History of colonic polyps  . Hemorrhoids  . Kidney stone    hx of . Blood transfusion 2010 . PMR (polymyalgia rheumatica) (HCC) 01/20/2012   tx  by specialist on a long prednisone taper . S/P PTCA (percutaneous transluminal  coronary angioplasty)  . Rheumatoid arthritis(714.0)    takes Methotrexate 7pills weekly . Degeneration of intervertebral disc, site unspecified  . Myocardial infarction ([place]) 2010 . Pneumonia 2008; 07/2015 . Type II or unspecified type diabetes mellitus without mention of complication, not stated as uncontrolled  . Skin cancer    "arms" Past Surgical History: Past Surgical History Procedure Laterality Date . Rotator cuff repair Left 2000 . Colonoscopy   . Vasectomy  1979 . Coronary angioplasty with stent placement  02/2009   has one stent . Carotid doppler  10/12   0-39% R and 60-79% left  . Cataract extraction  2012 . Cardiac catheterization  2010 . Lithotripsy  2009 . Tee without cardioversion N/A 08/06/2015   Procedure: TRANSESOPHAGEAL ECHOCARDIOGRAM (TEE);  Surgeon: Skeet Latch, MD;  Location: [facility];  Service: Cardiovascular;  Laterality: N/A; . Cardioversion N/A 08/06/2015   Procedure: CARDIOVERSION;  Surgeon: Skeet Latch, MD;  Location: [facility] ENDOSCOPY;  Service: Cardiovascular;  Laterality: N/A; . Back surgery   . Lumbar laminectomy  07/2015 . Posterior laminectomy thoracic spine     "fixed the discs" . Skin cancer excision     "arms" HPI: 75 year old male with history of hypertension, GERD, A. fib, sepsis related to pneumonia with related shock, respiratory failure requiring intubation, A. fib with RVR admitted from [facility] with AMS suspicious for infectious vs medication induced etiologies. FEES 3/2 with moderate oropharyngeal dysphagia with recommendations for dysphagia 1 textures with honey thick liquids via tsp. Patient admitted to [facility] 3/7 and has been participating in dysphagia treatment. MBS today to assess for possible diet upgrade.  Subjective: pt is eager for a cup of coffee Assessment / Plan / Recommendation CHL IP CLINICAL IMPRESSIONS 08/25/2015 Therapy Diagnosis Mild oral phase dysphagia;Mild pharyngeal phase dysphagia Clinical Impression Patient demonstrates a mild  oropharyngeal dysphagia characterized by delayed oral transit with piecemeal swallowing with delayed swallow initiation resulting in impaired timing of swallow and intermittent silent penetration of nectar-thick and thin liquids via cup. A cued cough was effective in expelling penetrates from the laryngeal vestibule and a chin tuck was effective in eliminating penetration episodes. Recommend patient initiate a diet of regular textures with thin liquids with full supervision for utilization of swallowing compensatory strategies.  Patient and daughter verbalize understanding of all information.  Impact on safety and function Mild aspiration risk   CHL IP TREATMENT RECOMMENDATION 08/25/2015 Treatment Recommendations Therapy as outlined in treatment plan below   Prognosis 08/25/2015 Prognosis for Safe Diet Advancement Good Barriers to Reach Goals -- Barriers/Prognosis Comment -- CHL IP DIET RECOMMENDATION 08/25/2015 SLP Diet Recommendations Regular solids;Thin liquid Liquid Administration via Cup;No straw Medication Administration Crushed with puree Compensations Minimize environmental distractions;Slow rate;Small sips/bites;Multiple dry swallows after each bite/sip;Hard cough after swallow;Chin tuck Postural Changes Seated upright at 90 degrees   CHL IP OTHER RECOMMENDATIONS 08/25/2015 Recommended Consults -- Oral Care Recommendations Oral care BID Other Recommendations --   CHL IP FOLLOW UP RECOMMENDATIONS 08/25/2015 Follow up Recommendations Outpatient SLP;24 hour supervision/assistance   CHL IP FREQUENCY AND DURATION 08/25/2015 Speech Therapy Frequency (ACUTE ONLY) min 5x/week Treatment Duration 2 weeks      CHL IP ORAL PHASE 08/25/2015 Oral Phase Impaired Oral - Pudding Teaspoon -- Oral - Pudding Cup -- Oral - Honey Teaspoon Piecemeal swallowing;Delayed oral transit Oral - Honey Cup Piecemeal swallowing;Delayed oral transit Oral - Nectar Teaspoon Piecemeal swallowing;Delayed oral transit Oral - Nectar Cup Piecemeal  swallowing;Delayed oral transit Oral - Nectar Straw -- Oral - Thin Teaspoon Piecemeal swallowing;Delayed oral transit Oral - Thin Cup Delayed oral transit;Piecemeal swallowing Oral - Thin Straw Delayed oral transit;Piecemeal swallowing Oral - Puree Delayed oral transit;Piecemeal swallowing Oral - Mech Soft Delayed oral transit;Piecemeal swallowing Oral - Regular -- Oral - Multi-Consistency -- Oral - Pill -- Oral Phase - Comment --  CHL IP PHARYNGEAL PHASE 08/25/2015 Pharyngeal Phase Impaired Pharyngeal- Pudding Teaspoon -- Pharyngeal -- Pharyngeal- Pudding Cup -- Pharyngeal -- Pharyngeal- Honey Teaspoon Delayed swallow initiation-vallecula;Reduced tongue base retraction Pharyngeal Material does not enter airway Pharyngeal- Honey Cup Delayed swallow initiation-vallecula;Pharyngeal residue - valleculae Pharyngeal Material does not enter airway Pharyngeal- Nectar Teaspoon Delayed swallow initiation-vallecula;Pharyngeal residue - valleculae Pharyngeal Material does not enter airway Pharyngeal- Nectar Cup Compensatory strategies attempted (with notebox);Penetration/Aspiration during swallow;Delayed swallow initiation-vallecula;Pharyngeal residue - valleculae Pharyngeal Material enters airway, remains ABOVE vocal cords and not ejected out Pharyngeal- Nectar Straw -- Pharyngeal -- Pharyngeal- Thin Teaspoon Delayed swallow initiation-vallecula;Pharyngeal residue - valleculae Pharyngeal -- Pharyngeal- Thin Cup Delayed swallow initiation-vallecula;Penetration/Aspiration during swallow;Compensatory strategies attempted (with notebox);Pharyngeal residue - valleculae Pharyngeal Material enters airway, CONTACTS cords and not ejected out Pharyngeal- Thin Straw Delayed swallow initiation-vallecula;Penetration/Aspiration during swallow Pharyngeal Material enters airway, CONTACTS cords and not ejected out Pharyngeal- Puree Delayed swallow initiation-vallecula;Pharyngeal residue - valleculae Pharyngeal Material does not enter airway  Pharyngeal- Mechanical Soft Delayed swallow initiation-vallecula;Pharyngeal residue - valleculae Pharyngeal -- Pharyngeal- Regular -- Pharyngeal -- Pharyngeal- Multi-consistency -- Pharyngeal -- Pharyngeal- Pill -- Pharyngeal -- Pharyngeal Comment --  CHL IP CERVICAL ESOPHAGEAL PHASE 08/25/2015 Cervical Esophageal Phase WFL Pudding Teaspoon -- Pudding Cup -- Honey Teaspoon -- Honey Cup -- Nectar Teaspoon -- Nectar Cup -- Nectar Straw -- Thin Teaspoon -- Thin Cup -- Thin Straw -- Puree -- Mechanical Soft -- Regular -- Multi-consistency -- Pill -- Cervical Esophageal Comment -- CHL IP GO 08/16/2015 Functional Assessment Tool Used ASHA NOMS and clinical judgment.   Functional Limitations Swallowing Swallow Current Status [phone]) (None) Swallow Goal Status [id number]) (None) Swallow Discharge Status [phone]) (None) Motor Speech Current Status [phone]) (None) Motor Speech Goal Status [phone]) (None) Motor Speech Goal Status [phone]) (None) Spoken Language Comprehension Current Status [phone]) (None) Spoken Language Comprehension Goal Status [id number]) (None) Spoken Language Comprehension Discharge Status [phone]) (None) Spoken Language Expression Current Status [phone]) (None) Spoken Language Expression Goal Status [id number]) (None) Spoken Language Expression Discharge Status [phone]) (None) Attention Current Status [id number]) (None) Attention Goal Status [id number]) (None) Attention Discharge Status [phone]) (None) Memory Current Status [id number]) (None) Memory Goal Status [id number]) (None) Memory Discharge Status [id number]) (None) Voice Current Status [id number]) (None) Voice Goal Status [id number]) (None) Voice Discharge Status [phone]) (None) Other Speech-Language Pathology Functional Limitation [phone]) (None) Other Speech-Language Pathology Functional Limitation Goal Status [id number]) (None) Other Speech-Language Pathology Functional Limitation Discharge Status [phone]) (None) PAYNE, COURTNEY 08/25/2015, 4:32 PM              Ct Angio Chest Aorta W/cm &/or  Wo/cm  09/06/2015  CLINICAL DATA:  Chest pain radiating to the back beginning at 1600 hours. Similar symptoms September 04, 2015. History of polymyalgia rheumatica, diabetes, CHF. EXAM: CT ANGIOGRAPHY CHEST WITH AND WITHOUT CONTRAST TECHNIQUE: Multidetector CT imaging of the chest was performed using the standard protocol during bolus administration of intravenous contrast. Multiplanar CT image reconstructions and MIPs were obtained to evaluate the vascular anatomy. CONTRAST:  61mL OMNIPAQUE IOHEXOL 350 MG/ML SOLN COMPARISON:  CT chest July 26, 2015 and chest radiograph September 05, 2015 FINDINGS: MEDIASTINUM: The heart is mildly  enlarged, similar to prior examination. Small residual pericardial effusion. Moderate coronary artery calcifications. Noncontrast CT of the thoracic aorta shows no abnormal density. Postcontrast imaging of the aorta shows homogeneous contrast opacification, normal in course and caliber with moderate calcific atherosclerosis and and intimal thickening. No aneurysm, dissection, suspicious luminal irregularity, periaortic fluid collections or contrast extravasation. High-grade stenosis LEFT V1 segment due to atherosclerosis, patent arch vessels. Scattered prominent though not pathologically enlarged mediastinal lymph nodes measure up to 9 mm, previously 11 mm short axis. LUNGS: Tracheobronchial tree is patent, no pneumothorax. Complete resolution of pleural effusions. Dependent atelectasis and/or scarring. RIGHT upper lobe atelectasis. No focal consolidation. Interval extubation. SOFT TISSUES AND OSSEOUS STRUCTURES: Thoracolumbar PLIF. Osteopenia stable mild T5, moderate to severe to T7, moderate T8 mild T9 compression fractures. Multiple small gallstones. Colonic diverticulosis partially imaged. 13 mm exophytic RIGHT interpolar cyst. Multiple thyroid nodules measure up to 14 mm, which do not reach size criteria for recommended follow-up. Review of the MIP images confirms the above findings.  IMPRESSION: No acute vascular process. Moderate atherosclerosis and high-grade stenosis LEFT vertebral artery origin. Stable cardiomegaly, small residual pericardial effusion. Resolution of pleural effusions. No acute pulmonary process. Electronically Signed   By: Elon Alas M.D.   On: 09/06/2015 02:04   Dg Hip Unilat With Pelvis 2-3 Views Right  09/01/2015  CLINICAL DATA:  No known injury, right hip pain EXAM: DG HIP (WITH OR WITHOUT PELVIS) 2-3V RIGHT COMPARISON:  None. FINDINGS: Mild narrowing of both hips was mild right osteophyte formation. No fracture or dislocation. Pelvic bones intact. Lumbar spinal rods partially visualized. Calcification of the aortoiliac and femoral arteries bilaterally. IMPRESSION: No acute findings Electronically Signed   By: Skipper Cliche M.D.   On: 09/01/2015 11:45    ECG  Atrial flutter with RVR   ASSESSMENT AND PLAN  1. Recurrent chest pain, somewhat atypical  - Recent negative Myoview in November 2016. CP worse with deep inspiration, but not affect by palpation  - doubt ACS as trop flat, potential possibility include ischemia vs pleuritic pain vs pericarditis   - previous seen by Dr. Marlou Porch on 09/06/2015 who recommended medical therapy, EKG shows sinus tach on arrival, but now is aflutter with RVR. INR subtherapeutic.   - will try to control his HR better, add 120mg  diltiazem CD, still has CP after rate control, may need cardiac cath to definitively assess coronaries. No benefit of repeating a myoview after only 4 month  2. Recurrent paroxysmal atrial flutter with RVR  - CHA2DS2-Vasc score 4 (CAD, age, DM)  - add diltiazem 120mg  daily, continue amiodarone.  3. CAD s/p LCx BMS in 2010  4. CKD stage III 5. 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 h/o AVM with GI bleed 2014 7. DM   Signed, Almyra Deforest, PA-C 09/09/2015, 11:49 AM   Patient seen and examined. Agree with assessment and plan.  Curtis Frazier is a 75 year old Caucasian male who  has undergone bare-metal stenting to his left circumflex coronary artery in 2010.  He has a history of polymyalgia rheumatica and has been on steroid therapy, history of AVM with GI bleed in 2004, diabetes mellitus, stage III renal insufficiency, and has a history of paroxysmal atrial fib/flutter.  Recently, he has been on amiodarone and warfarin.  A nuclear stress test in November 2016 was low risk and showed mild apical thinning without ischemia and an ejection fraction of 65%.  The patient describes pleuritic-like chest pain associated with sharp discomfort exacerbated by deep breathing.  He specifically states this is a completely different discomfort than he experienced in 2010 prior to his stent implantation.  A chest CT on March 27 showed stable cardiomegaly with small residual pericardial effusion and resolution of previously noted pleural effusions.  There is no acute pulmonary process.  His ECG reveals that he has recurrent atrial flutter with variable block at a heart rate of 1 35 bpm with occasional PVCs.  Although the patient is on warfarin anticoagulation, his INR is subtherapeutic.  He has had a minimal flat troponin elevation which most likely is due to demand ischemia.  I do not feel that his chest pain is of ischemic etiology and at this point, would not recommend invasive evaluation.  With his recurrent AF, I would further titrate amiodarone to 400 mg daily and agree with the addition of diltiazem or beta blocker to his medical regimen.  His ultrasound of his abdomen has revealed gallstones without cholecystitis.  He has stage III chronic kidney disease with a creatinine of 1.35 and an estimated GFR of 49.  Would re-check thyroid function studies in this patient on amiodarone; of note, TSH was over suppressed at 0.319 on 08/11/2015 and he had a increased free T4 at 1.53.  Will follow patient with you.   Troy Sine, MD, [facility] 09/09/2015 1:13 PM

## 2015-09-09 NOTE — Consult Note (Signed)
   [facility] CM Inpatient Consult   09/09/2015  Curtis Frazier 22-Mar-1941 [id number]    Patient evaluated for long-term disease management services with [facility] program. Martin Majestic to bedside to discuss and offer [facility] services. Patient is agreeable and written consent signed. Explained to patient that he will receive post hospital transition of care calls and will be evaluated for monthly home visits. Confirmed Primary Care MD as Dr. Glori Bickers.  Confirmed best contact number as [phone]. Denies having issues with affording medications and with transportation. His daughter, [name], fills his pill box. Explained that [facility] will not interfere or replace services provided by home health. Patient has been active with [place].  Left [facility] Care Management packet and contact information at bedside. Made inpatient RNCM aware that patient will be followed by [facility] post hospital discharge.  Will request for patient to be assigned to [place] for transition of care. Patient with multiple co-morbidities and multiple admissions and history of CHF, HTN, DM.    Marthenia Rolling, MSN-Ed, RN,BSN [facility] Liaison [phone]

## 2015-09-09 NOTE — Progress Notes (Addendum)
Shift event note:  Notified by RN that pt awake c/o CP 10/10. A stat EKG was obtained that revealed A-Flutter w/ a rate of 135, though the tracing was poor in most leads. Pt was given nitro 0.4 mg SL x 1 w/o much relief. Requested repeat EKG and ordered GI Cocktail. Recent admission for CP (d/c'd 09/05/15) and was r/o for MI by cardiology. Known h/o paroxysmal atrial fibrillation, chronic antegrade relation, coronary disease status post circumflex bare-metal stent in 2010 with recent nuclear stress test > low risk. Readmitted 09/07/15 w/ c/o SSCP which is felt to be related to cholelithiasis and was initially relieved by GI cocktail in ED. At bedside pt noted resting in NAD though continues to report CP of 7/10 after SL nitro. States pain started at approx 0300, is (L) sided and sharp in nature, constant and radiates to his back. Pain is unchanged w/ position change, respirations or chest palpation and is not associated w/ nausea, dizziness or SOB. Denies having CP in the past w/ his A-Fib.  Skin w/d, BBS diminished w/ fine crackles at the bases, abd is distended but soft, nt w/ bs in all quads. HR currently in the 120's w/o M/G/R. Remaining VSS and pt remains afebrile. Repeat EKG's noted w/ multiple interpretations and are also poor in quality. Chest xray pending. Assessment/Plan 1. Chest pain: In pt w/ significant h/o CAD and cholelithiasis. Recurrent issue. Unclear etiology as nitro nor GI cocktail seemed to provide any relief. EKG/s w/o acute changes except for rhythm change. Given poor quality of EKG's discussed pt w/ Dr Loleta Books who also reviewed EKG's. Will give IV morphine for pain. Given h/o GI bleed will defer ASA. Add troponin. If positive will cycle. Follow CXR. 2. A-fib/flutter: H/o same. Currently on daily Amioderone and Metoprolol. Small dose of IV Lopressor. Will defer AC to rounding MD d/t h/o GIB. Will continue to monitor closely on telemetry.   Jeryl Columbia, NP-C [facility] Pager [phone]

## 2015-09-10 ENCOUNTER — Telehealth: Payer: Self-pay | Admitting: Cardiovascular Disease

## 2015-09-10 LAB — PROTIME-INR
INR: 2.44 — AB (ref 0.00–1.49)
PROTHROMBIN TIME: 26.2 s — AB (ref 11.6–15.2)

## 2015-09-10 LAB — GLUCOSE, CAPILLARY
GLUCOSE-CAPILLARY: 158 mg/dL — AB (ref 65–99)
Glucose-Capillary: 176 mg/dL — ABNORMAL HIGH (ref 65–99)
Glucose-Capillary: 257 mg/dL — ABNORMAL HIGH (ref 65–99)
Glucose-Capillary: 190 mg/dL — ABNORMAL HIGH (ref 65–99)

## 2015-09-10 LAB — TSH: TSH: 1.112 u[IU]/mL (ref 0.350–4.500)

## 2015-09-10 MED ORDER — DILTIAZEM HCL ER COATED BEADS 120 MG PO TB24
120.0000 mg | ORAL_TABLET | Freq: Once | ORAL | Status: AC
  Administered 2015-09-10: 120 mg via ORAL

## 2015-09-10 MED ORDER — WARFARIN SODIUM 1 MG PO TABS
1.0000 mg | ORAL_TABLET | Freq: Once | ORAL | Status: AC
  Administered 2015-09-10: 1 mg via ORAL
  Filled 2015-09-10: qty 1

## 2015-09-10 MED ORDER — DILTIAZEM HCL ER COATED BEADS 120 MG PO TB24: 120.0000 mg | ORAL_TABLET | Freq: Every day | ORAL | Status: DC

## 2015-09-10 MED ORDER — DILTIAZEM HCL ER COATED BEADS 240 MG PO CP24
240.0000 mg | ORAL_CAPSULE | Freq: Every day | ORAL | Status: DC
  Administered 2015-09-11 – 2015-09-16 (×6): 240 mg via ORAL
  Filled 2015-09-10 (×6): qty 1

## 2015-09-10 NOTE — Progress Notes (Signed)
Patient Profile: 75 yo Caucasian male with PMH of CAD s/p LCx BMS in 2010, CKD stage III, polymyalgia rheumatica on chronic steroid, h/o AVM with GI bleed 2014, DM and recurrent paroxysmal atrial fib/flutter after lumber surgery presented with chest pain.   Subjective: Patient complains of dyspnea. He is on supplemental O2 via [place], but nose piece was not properly placed. I adjusted this which seemed to help. He still has atypical chest pain, described as sharp, shooting, pleuritic left sided chest wall pain with tenderness with palpation of chest wall.   Objective: Vital signs in last 24 hours: Temp:  [98 F (36.7 C)-98.7 F (37.1 C)] 98 F (36.7 C) (03/31 0431) Pulse Rate:  [95-124] 124 (03/31 0431) Resp:  [20] 20 (03/31 0431) BP: (110-114)/(73-78) 110/73 mmHg (03/31 0431) SpO2:  [90 %-99 %] 90 % (03/31 0431) Last BM Date: 09/08/15  Intake/Output from previous day: 03/30 0701 - 03/31 0700 In: 1692.5 [P.O.:600; I.V.:1092.5] Out: 1325 [Urine:1325] Intake/Output this shift:    Medications Current Facility-Administered Medications  Medication Dose Route Frequency Provider Last Rate Last Dose  . 0.9 %  sodium chloride infusion   Intravenous Continuous Robbie Lis, MD 10 mL/hr at 09/09/15 [phone]    . acetaminophen (TYLENOL) tablet 650 mg  650 mg Oral Q6H PRN Samella Parr, NP       Or  . acetaminophen (TYLENOL) suppository 650 mg  650 mg Rectal Q6H PRN Samella Parr, NP      . amiodarone (PACERONE) tablet 200 mg  200 mg Oral BID Troy Sine, MD   200 mg at 09/09/15 2125  . collagenase (SANTYL) ointment   Topical Daily Robbie Lis, MD      . diltiazem [facility] CD) 24 hr capsule 120 mg  120 mg Oral Daily Troy Sine, MD   120 mg at 09/09/15 1425  . enoxaparin (LOVENOX) injection 40 mg  40 mg Subcutaneous QHS Samella Parr, NP   40 mg at 09/09/15 2124  . insulin aspart (novoLOG) injection 0-9 Units  0-9 Units Subcutaneous TID WC Robbie Lis, MD   2 Units at 09/10/15  [phone]  . levofloxacin (LEVAQUIN) tablet 750 mg  750 mg Oral Q24H Robbie Lis, MD   750 mg at 09/09/15 1137  . nitroGLYCERIN (NITROSTAT) SL tablet 0.4 mg  0.4 mg Sublingual Once Jeryl Columbia, NP   0.4 mg at 09/09/15 0433  . ondansetron (ZOFRAN) tablet 4 mg  4 mg Oral Q6H PRN Samella Parr, NP       Or  . ondansetron [facility]) injection 4 mg  4 mg Intravenous Q6H PRN Samella Parr, NP      . pantoprazole (PROTONIX) EC tablet 40 mg  40 mg Oral Daily Samella Parr, NP   40 mg at 09/09/15 0935  . predniSONE (DELTASONE) tablet 10 mg  10 mg Oral Q breakfast Robbie Lis, MD   10 mg at 09/10/15 0843  . sodium chloride flush (NS) 0.9 % injection 3 mL  3 mL Intravenous Q12H Samella Parr, NP   3 mL at 09/09/15 2125  . thiamine (VITAMIN B-1) tablet 100 mg  100 mg Oral Daily Samella Parr, NP   100 mg at 09/09/15 0935  . Warfarin - Pharmacist Dosing Inpatient   Does not apply q1800 Karren Cobble, [facility]        PE: General appearance: alert, cooperative, no distress and using [place]: no carotid bruit  and no JVD Lungs: clear to auscultation bilaterally Heart: irregular, tachy rate Extremities: no LEE Pulses: 2+ and symmetric Skin: warm and dry Neurologic: Grossly normal  Lab Results:   Recent Labs  09/07/15 1450 09/08/15 0326 09/09/15 1058  WBC 6.7 7.9 8.9  HGB 13.3 12.4* 10.6*  HCT 41.4 37.9* 32.5*  PLT 154 168 257   BMET  Recent Labs  09/07/15 1450 09/08/15 0326 09/09/15 1223  NA 134* 138 137  K 4.3 4.6 4.5  CL 108 114* 110  CO2 18* 16* 16*  GLUCOSE 140* 145* 256*  BUN 16 12 11   CREATININE 1.62* 1.35* 1.39*  CALCIUM 7.8* 7.1* 7.3*   PT/INR  Recent Labs  09/08/15 0326 09/09/15 0426 09/10/15 0300  LABPROT 17.8* 17.9* 26.2*  INR 1.45 1.47 2.44*     Assessment/Plan  Principal Problem:   Chest pain on exertion Active Problems:   Coronary atherosclerosis   Rheumatoid arthritis (HCC)   PMR (polymyalgia rheumatica) (HCC)   BPH (benign prostatic  hyperplasia)   Chronic diastolic congestive heart failure (HCC)   Symptomatic cholelithiasis   Elevated troponin   Kidney disease, chronic, stage III (GFR 30-59 ml/min)   Anemia of chronic disease   Acute posthemorrhagic anemia   Abnormal thyroid function test   1. Chest Pain: patient describes pleuritic-like chest pain associated with sharp discomfort exacerbated by deep breathing. He specifically states this is a completely different discomfort than he experienced in 2010 prior to his stent implantation. A chest CT on March 27 showed stable cardiomegaly with small residual pericardial effusion and resolution of previously noted pleural effusions. There is no acute pulmonary process, including no PE. No aortic disease.He has had a minimal flat troponin elevation which most likely is due to demand ischemia. He also had a low risk NST 04/2015.Symptoms do not appear to be cardiac in etiology. No invasive evaluation is recommended. This seems to be perhaps musculoskeletal, described as sharp, shooting, pleuritic left sided chest wall pain with tenderness with palpation of left upper chest wall. Consider heating pad for supportive care. No NSAIDs given Coumadin. Can try PRN Tylenol for pain.   2. CAD: s/p BMS to LCx in 2010. NST 04/2015 low risk. Recent CP not c/w cardiac etiology. No invasive w/u recommended. Continue medical therapy.   3. Atrial Flutter: K, Mg and TSH all WNL. Amiodarone titrated to 400 mg daily. Cardizem 120 was also added yesterday.  His rate is still fast, in the 120s and he is symptomatic with dyspnea. BP is stable. He is due for his am dose of amiodarone and Cardizem. Will monitor rate throughout the day and will make further adjustments if needed.  Continue Coumadin for anticoagulation. INR is subtherapeutic at 1.47. Goal is 2-3.    LOS: 3 days    Brittainy M. Ladoris Gene 09/10/2015 9:32 AM  Patient seen and examined. Agree with assessment and plan. Rhythm today A  F/flutter at 130 earlier; now at 120;  Will give extra cardizem 120 mg now and change to 240 mg daily. No chest pain.    Troy Sine, MD, [facility] 09/10/2015 10:29 AM

## 2015-09-10 NOTE — Telephone Encounter (Signed)
Being seen by cardiology in hospital Dr Claiborne Billings saw today

## 2015-09-10 NOTE — Progress Notes (Signed)
ANTICOAGULATION CONSULT NOTE - Follow Up Consult  Pharmacy Consult for Coumadin Indication: atrial fibrillation  Allergies  Allergen Reactions  . Ace Inhibitors Other (See Comments)    Causes high K  . Angiotensin Receptor Blockers Other (See Comments)    Causes high K  . Metoprolol Other (See Comments)    May cause elevated K level  . Penicillins Swelling and Rash    Has patient had a PCN reaction causing immediate rash, facial/tongue/throat swelling, SOB or lightheadedness with hypotension: YES Has patient had a PCN reaction causing severe rash involving mucus membranes or skin necrosis: NO Has patient had a PCN reaction that required hospitalization NO Has patient had a PCN reaction occurring within the last 10 years: NO If all of the above answers are "NO", then may proceed with Cephalosporin use.  . Tetracycline Swelling and Rash    Patient Measurements: Height: 5\' 8"  (172.7 cm) Weight: 180 lb 8.9 oz (81.9 kg) IBW/kg (Calculated) : 68.4   Vital Signs: Temp: 98 F (36.7 C) (03/31 0431) Temp Source: Oral (03/31 0431) BP: 110/73 mmHg (03/31 0431) Pulse Rate: 124 (03/31 0431)  Labs:  Recent Labs  09/07/15 1450  09/08/15 0326 09/09/15 0426 09/09/15 1058 09/09/15 1223 09/09/15 1608 09/10/15 0300  HGB 13.3  --  12.4*  --  10.6*  --   --   --   HCT 41.4  --  37.9*  --  32.5*  --   --   --   PLT 154  --  168  --  257  --   --   --   LABPROT  --   --  17.8* 17.9*  --   --   --  26.2*  INR  --   --  1.45 1.47  --   --   --  2.44*  CREATININE 1.62*  --  1.35*  --   --  1.39*  --   --   TROPONINI  --   < >  --  0.04* 0.03  --  <0.03  --   < > = values in this interval not displayed.  Estimated Creatinine Clearance: 43.7 mL/min (by C-G formula based on Cr of 1.39).   Assessment: 55 yom with history of aFib on warfarin PTA. Pharmacy consulted to dose warfarin, held 3/28. Surgery note says no lap chole required this admission, restarted warfarin on 3/29 + lovenox 40mg   QHS. INR with large bump after 2nd dose 1.47>2.44. Hg 10.6 (down), plt wnl. No bleed. INR may be difficult to control while on amio and levofloxacin.  -- PTA warf: 10 mg x2 days then 5 mg daily (new start?)  Goal of Therapy:  INR 2-3 Monitor platelets by anticoagulation protocol: Yes   Plan:  Coumadin 1mg  po x 1 tonight with large INR bump D/c lovenox as INR is therapeutic Daily INR Monitor s/sx bleeding  Elicia Lamp, PharmD, BCPS Clinical Pharmacist Pager [phone] 09/10/2015 9:53 AM

## 2015-09-10 NOTE — Progress Notes (Signed)
Patient ID: Curtis Frazier, male   DOB: Oct 09, 1940, 75 y.o.   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 Patient also has history of lumbar incision status post laminectomy earlier in February 2016. He was just discharged 3/27 after being in hospital for chest pain evaluation. Cardiology was consulted at that time and their recommendation was to continue medical management because patient's chest pain resolved.  He presented to [facility] with reports of epigastric and chest pain that radiates to the back, 10 out of 10 in intensity and with associated shortness of breath especially after ambulation.  On admission, he was hemodynamically stable. Initial thought was that he may have gallbladder related pain. HIDA scan was done and showed no acute cholecystitis. Liver enzymes are all within normal limits.  Overnight, patient complained of severe chest pain. He does have minimal elevation in troponin which seems to have been present even on past admission, 0.04. History of lead EKG last night showed atrial flutter. Cardiology consulted for input on management.   Assessment/Plan:    Principal Problem: Chest pain with exertion / mild troponin elevation - Minimal elevation in troponin but now WNL - Appreciate cardio following  - 2-D echo on this admission with ejection fraction of [id number], grade 1 diastolic dysfunction, normal wall motion - CP better but still there  - Continue amiodarone, per cardio increased dose to 400 mg daily  - Cardio added Cardizem 120 mg daily   Active Problems: Atrial fibrillation, chronic - Chads Vasc score 5 - On anticoagulation with Coumadin, dosing per pharmacy - Rate controlled with Amiodarone and  Cardizem    Lobar pneumonia, unspecified organism  - Awaiting results from chest x-ray this morning - Sable findings on CXR 3/30 - Continue Levaquin, started 3/30   Symptomatic cholelithiasis - HIDA scan normal - LFTs WNL, lipase WNL - Tolerates regular diet  - Since no acute cholecystitis surgery has signed off   History of laminectomy - Appreciate wound care assessment. - Wound measurement: 1cm x 0.3cm x 0.5cm  - Per wound care, wound has not yet healed at this point. The patient is past 2 week follow-up with neurosurgery. - Will let neurosx know   Rheumatoid arthritis ([place]) / PMR (polymyalgia rheumatica) (HCC) - Continue prednisone  Anemia of chronic disease  - Related to history of rheumatoid arthritis and polymyalgia rheumatica as well as chronic kidney disease  - Hemoglobin stable at 10.6 - No reports of bleeding  Chronic kidney disease stage III - Recent baseline creatinine 1.8  - Creatinine within baseline range   DVT Prophylaxis  - Lovenox subQ in hospital    Code Status: Full.  Family Communication:  plan of care discussed with the patient; spoke with his daughter over the phone today  Disposition Plan: home once medically stable, likely by 09/13/2015  IV access:  Peripheral IV  Procedures and diagnostic studies:    Nm Hepatobiliary Liver Func 09/08/2015   No evidence of cystic duct or common bile duct obstruction. Electronically Signed   By: Rolm Baptise M.D.   On: 09/08/2015 09:24   Medical Consultants:  Surgery Cardiology  Other Consultants:  None  IAnti-Infectives:   Levaquin 09/09/2015 -->    Curtis Bufano, MD  [facility] Pager [phone]  Time spent in minutes: 25 minutes  If 7PM-7AM, please contact night-coverage [email or web address] Password [email or web address] 09/10/2015, 12:08 PM   LOS: 3 days    HPI/Subjective: No acute overnight events. Some shortness of breath with exertion.   Objective: Filed Vitals:   09/09/15 1730 09/09/15 1734  09/09/15 2030 09/10/15 0431  BP:   114/78 110/73  Pulse:   123 124  Temp:   98.5 F (36.9 C) 98 F (36.7 C)  TempSrc:   Oral Oral  Resp:   20 20  Height:      Weight:      SpO2: 95% 94% 91% 90%    Intake/Output Summary (Last 24 hours) at 09/10/15 1208 Last data filed at 09/10/15 0435  Gross per 24 hour  Intake    240 ml  Output   1325 ml  Net  -1085 ml    Exam:   General:  Pt is alert, no distress   Cardiovascular: Tachycardic, (+) S1, S2  Respiratory: Diminished, no wheezing, no rhonchi   Abdomen: (+) BS, non distended abd   Extremities: No edema, palpable pulses   Neuro: Nonfocal   Data Reviewed: Basic Metabolic Panel:  Recent Labs Lab 09/05/15 2136 09/07/15 1037 09/07/15 1450 09/07/15 1826 09/08/15 0326 09/09/15 1223  NA 137 135 134*  --  138 137  K 4.7 4.4 4.3  --  4.6 4.5  CL 107 105 108  --  114* 110  CO2 21* 21* 18*  --  16* 16*  GLUCOSE 216* 199* 140*  --  145* 256*  BUN 21* 15 16  --  12 11  CREATININE 1.82* 1.66* 1.62*  --  1.35* 1.39*  CALCIUM 8.7* 8.1* 7.8*  --  7.1* 7.3*  MG  --   --   --  1.4*  --  2.0   Liver Function Tests:  Recent Labs Lab 09/07/15 1037 09/07/15 1450 09/08/15 0326  AST 16 16 18   ALT 19 17 15*  ALKPHOS 55 50 49  BILITOT 0.9 0.9 0.9  PROT 5.8* 5.3* 4.6*  ALBUMIN 2.5* 2.3* 2.1*    Recent Labs Lab 09/07/15 1037  LIPASE 26   No results for input(s): AMMONIA in the last 168 hours. CBC:  Recent Labs Lab 09/05/15 2136 09/07/15 1037 09/07/15 1450 09/08/15 0326 09/09/15 1058  WBC 9.8 9.8 6.7 7.9 8.9  NEUTROABS  --   --  4.5  --   --   HGB 11.0* 10.1* 13.3 12.4* 10.6*  HCT 33.8* 32.5* 41.4 37.9* 32.5*  MCV 98.8 98.2 97.2 98.4 97.6  PLT 256 247 154 168 257   Cardiac Enzymes:  Recent Labs Lab 09/07/15 1417 09/07/15 2343 09/09/15 0426 09/09/15 1058 09/09/15 1608  TROPONINI 0.04* 0.03 0.04* 0.03 <0.03   BNP: Invalid input(s): POCBNP CBG:  Recent Labs Lab 09/09/15 1134 09/09/15 1643  09/09/15 2117 09/10/15 0609 09/10/15 1147  GLUCAP 170* 270* 185* 158* 176*    Recent Results (from the past 240 hour(s))  Blood Culture (routine x 2)     Status: None (Preliminary result)   Collection Time: 09/07/15  2:40 PM  Result Value Ref Range Status   Specimen Description BLOOD RIGHT HAND  Final   Special Requests BOTTLES DRAWN AEROBIC AND ANAEROBIC 5CC  Final   Culture NO GROWTH 3 DAYS  Final   Report Status PENDING  Incomplete  Blood Culture (routine x 2)     Status: None (Preliminary result)   Collection Time: 09/07/15  2:50  PM  Result Value Ref Range Status   Specimen Description BLOOD LEFT ANTECUBITAL  Final   Special Requests BOTTLES DRAWN AEROBIC AND ANAEROBIC 5CC  Final   Culture NO GROWTH 3 DAYS  Final   Report Status PENDING  Incomplete  Urine culture     Status: None   Collection Time: 09/07/15  5:20 PM  Result Value Ref Range Status   Specimen Description URINE, CLEAN CATCH  Final   Special Requests NONE  Final   Culture MULTIPLE SPECIES PRESENT, SUGGEST RECOLLECTION  Final   Report Status 09/09/2015 FINAL  Final     Scheduled Meds: . amiodarone  200 mg Oral BID  . collagenase   Topical Daily  . [START ON 09/11/2015] diltiazem  240 mg Oral Daily  . insulin aspart  0-9 Units Subcutaneous TID WC  . levofloxacin  750 mg Oral Q24H  . nitroGLYCERIN  0.4 mg Sublingual Once  . pantoprazole  40 mg Oral Daily  . predniSONE  10 mg Oral Q breakfast  . sodium chloride flush  3 mL Intravenous Q12H  . thiamine  100 mg Oral Daily  . warfarin  1 mg Oral ONCE-1800  . Warfarin - Pharmacist Dosing Inpatient   Does not apply q1800   Continuous Infusions: . sodium chloride 10 mL/hr at 09/09/15 [phone]

## 2015-09-10 NOTE — Progress Notes (Signed)
PT Cancellation Note  Patient Details Name: Curtis Frazier MRN: [id number] DOB: 11/06/1940   Cancelled Treatment:    Reason Eval/Treat Not Completed: Medical issues which prohibited therapy Pt with resting heart rate in 140s bpm and with SOB. Holding PT evaluation at this time. Will follow up.   Marguarite Arbour A Milca Sytsma 09/10/2015, 9:26 AM Wray Kearns, PT, DPT [phone]

## 2015-09-10 NOTE — Care Management Important Message (Signed)
Important Message  Patient Details  Name: Curtis Frazier MRN: [id number] Date of Birth: [birth year]   Medicare Important Message Given:  Yes    [name] May 09/10/2015, 11:28 AM

## 2015-09-10 NOTE — Telephone Encounter (Signed)
Will forward to Dr. Johnsie Cancel. According to patient's chart cardiology has been consulted. Daughter is aware of this, but wanted Dr. Johnsie Cancel to know.

## 2015-09-10 NOTE — Telephone Encounter (Signed)
New Message:   Pt's daughter called in today stating that the pt has been in the hospital since 3/19 and she would like for Dr. Johnsie Cancel to come by and see him if at all possible. She says that the doctors have not found a solid diagnosis for the pt as of yet but they feel it is cardiac related.

## 2015-09-10 NOTE — Progress Notes (Signed)
Elevated HR - 130 - 150's - not sustained/ them down to 101/ min. Paged Hospitalist.

## 2015-09-11 DIAGNOSIS — I482 Chronic atrial fibrillation: Secondary | ICD-10-CM

## 2015-09-11 DIAGNOSIS — I4892 Unspecified atrial flutter: Secondary | ICD-10-CM | POA: Insufficient documentation

## 2015-09-11 LAB — PROTIME-INR
INR: 2.69 — AB (ref 0.00–1.49)
PROTHROMBIN TIME: 28.2 s — AB (ref 11.6–15.2)

## 2015-09-11 LAB — GLUCOSE, CAPILLARY
GLUCOSE-CAPILLARY: 180 mg/dL — AB (ref 65–99)
GLUCOSE-CAPILLARY: 174 mg/dL — AB (ref 65–99)
Glucose-Capillary: 197 mg/dL — ABNORMAL HIGH (ref 65–99)
Glucose-Capillary: 267 mg/dL — ABNORMAL HIGH (ref 65–99)

## 2015-09-11 LAB — PROCALCITONIN: PROCALCITONIN: 0.21 ng/mL

## 2015-09-11 MED ORDER — WARFARIN SODIUM 2 MG PO TABS
2.0000 mg | ORAL_TABLET | Freq: Once | ORAL | Status: AC
  Administered 2015-09-11: 2 mg via ORAL
  Filled 2015-09-11: qty 1

## 2015-09-11 MED ORDER — AMIODARONE HCL 200 MG PO TABS
400.0000 mg | ORAL_TABLET | Freq: Two times a day (BID) | ORAL | Status: DC
  Administered 2015-09-11 – 2015-09-15 (×8): 400 mg via ORAL
  Filled 2015-09-11 (×9): qty 2

## 2015-09-11 NOTE — Progress Notes (Signed)
PT Cancellation Note  Patient Details Name: Curtis Frazier MRN: [id number] DOB: 22-Feb-1941   Cancelled Treatment:    Reason Eval/Treat Not Completed: Medical issues which prohibited therapy.  Attempted to see patient x2 today.  HR elevated at rest - 120-130's.  Will return tomorrow for PT evaluation as appropriate for patient.   Despina Pole 09/11/2015, 3:52 PM Carita Pian. Sanjuana Kava, [place] Pager [phone]

## 2015-09-11 NOTE — Progress Notes (Signed)
Patient ID: Curtis Frazier, male   DOB: 19-Jul-1940, 75 y.o.   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 Patient also has history of lumbar incision status post laminectomy earlier in February 2016. He was just discharged 3/27 after being in hospital for chest pain evaluation. Cardiology was consulted at that time and their recommendation was to continue medical management because patient's chest pain resolved.  He presented to [facility] with reports of epigastric and chest pain that radiates to the back, 10 out of 10 in intensity and with associated shortness of breath especially after ambulation.  On admission, he was hemodynamically stable. Initial thought was that he may have gallbladder related pain. HIDA scan was done and showed no acute cholecystitis. Liver enzymes are all within normal limits.  Overnight, patient complained of severe chest pain. He does have minimal elevation in troponin which seems to have been present even on past admission, 0.04. History of lead EKG last night showed atrial flutter. Cardiology consulted for input on management.  Assessment/Plan:    Principal Problem: Chest pain with exertion / mild troponin elevation - Minimal elevation in troponin but now WNL - 2-D echo on this admission with ejection fraction of [id number], grade 1 diastolic dysfunction, normal wall motion - Cardio adjusting medical management: added Cardizem 120 mg daily, increased amiodarone to 400 mg daily  - Because his HR still 120-140's range so Cardizem now 240 mg daily starting 4/1 - CP slowly improving   Active Problems: Atrial fibrillation, chronic - Chads Vasc score 5 - Continue coumadin for AC - Continue amiodarone  and Cardizem for HR control     Lobar pneumonia, unspecified organism  - Awaiting results from chest x-ray this morning - Sable findings on CXR 3/30 - Continue Levaquin, started 3/30   Symptomatic cholelithiasis - HIDA scan normal - LFTs WNL, lipase WNL - Tolerates regular diet  - Since no acute cholecystitis surgery has signed off   History of laminectomy - Appreciate wound care assessment. - Wound measurement: 1cm x 0.3cm x 0.5cm  - Per wound care, wound has not yet healed at this point. The patient is past 2 week follow-up with neurosurgery. - Outpt follow up after discharge   Rheumatoid arthritis ([place]) / PMR (polymyalgia rheumatica) (HCC) - Continue prednisone per home dose   Anemia of chronic disease  - Related to history of rheumatoid arthritis and polymyalgia rheumatica as well as chronic kidney disease  - Hemoglobin stable at 10.6  Chronic kidney disease stage III - Recent baseline creatinine 1.8  - Creatinine within baseline range and even better, 1.39 on 3/30  DVT Prophylaxis  - Lovenox subQ   Code Status: Full.  Family Communication:  plan of care discussed with the patient; spoke with his daughter over the phone 3/31 Disposition Plan: home once medically stable, likely by 09/13/2015  IV access:  Peripheral IV  Procedures and diagnostic studies:    Nm Hepatobiliary Liver Func 09/08/2015   No evidence of cystic duct or common bile duct obstruction. Electronically Signed   By: Rolm Baptise M.D.   On: 09/08/2015 09:24   Medical Consultants:  Surgery Cardiology  Other Consultants:  None  IAnti-Infectives:   Levaquin 09/09/2015 -->  Leisa Lenz, MD  [facility] Pager [phone]  Time spent in minutes: 15 minutes  If 7PM-7AM, please contact night-coverage [email or web address] Password [email or web address] 09/11/2015, 11:02 AM   LOS: 4 days    HPI/Subjective: No acute overnight events. Says his CP little better this am.   Objective: Filed Vitals:   09/10/15  0431 09/10/15 1613 09/10/15 2027 09/11/15 0404  BP: 110/73 113/73 117/68 120/77  Pulse: 124 128 110 124  Temp: 98 F (36.7 C)  97.7 F (36.5 C) 98.6 F (37 C)  TempSrc: Oral  Oral Oral  Resp: 20 21 20 20   Height:      Weight:      SpO2: 90% 91% 93% 92%    Intake/Output Summary (Last 24 hours) at 09/11/15 1102 Last data filed at 09/11/15 0800  Gross per 24 hour  Intake    840 ml  Output   1501 ml  Net   -661 ml    Exam:   General:  Pt is awake, no distress  Cardiovascular: appreciate S1, S2, tachycardic   Respiratory: bilateral air entry, no wheezing   Abdomen: on tender, non distended, (+) BS  Extremities: No swelling, palpable pulses   Neuro: No focal deficits  Data Reviewed: Basic Metabolic Panel:  Recent Labs Lab 09/05/15 2136 09/07/15 1037 09/07/15 1450 09/07/15 1826 09/08/15 0326 09/09/15 1223  NA 137 135 134*  --  138 137  K 4.7 4.4 4.3  --  4.6 4.5  CL 107 105 108  --  114* 110  CO2 21* 21* 18*  --  16* 16*  GLUCOSE 216* 199* 140*  --  145* 256*  BUN 21* 15 16  --  12 11  CREATININE 1.82* 1.66* 1.62*  --  1.35* 1.39*  CALCIUM 8.7* 8.1* 7.8*  --  7.1* 7.3*  MG  --   --   --  1.4*  --  2.0   Liver Function Tests:  Recent Labs Lab 09/07/15 1037 09/07/15 1450 09/08/15 0326  AST 16 16 18   ALT 19 17 15*  ALKPHOS 55 50 49  BILITOT 0.9 0.9 0.9  PROT 5.8* 5.3* 4.6*  ALBUMIN 2.5* 2.3* 2.1*    Recent Labs Lab 09/07/15 1037  LIPASE 26   No results for input(s): AMMONIA in the last 168 hours. CBC:  Recent Labs Lab 09/05/15 2136 09/07/15 1037 09/07/15 1450 09/08/15 0326 09/09/15 1058  WBC 9.8 9.8 6.7 7.9 8.9  NEUTROABS  --   --  4.5  --   --   HGB 11.0* 10.1* 13.3 12.4* 10.6*  HCT 33.8* 32.5* 41.4 37.9* 32.5*  MCV 98.8 98.2 97.2 98.4 97.6  PLT 256 247 154 168 257   Cardiac Enzymes:  Recent Labs Lab 09/07/15 1417 09/07/15 2343 09/09/15 0426 09/09/15 1058 09/09/15 1608  TROPONINI 0.04* 0.03 0.04* 0.03 <0.03    BNP: Invalid input(s): POCBNP CBG:  Recent Labs Lab 09/10/15 0609 09/10/15 1147 09/10/15 1617 09/10/15 2054 09/11/15 0622  GLUCAP 158* 176* 257* 190* 180*    Recent Results (from the past 240 hour(s))  Blood Culture (routine x 2)     Status: None (Preliminary result)   Collection Time: 09/07/15  2:40 PM  Result Value Ref Range Status   Specimen Description BLOOD RIGHT HAND  Final   Special Requests BOTTLES DRAWN AEROBIC AND ANAEROBIC 5CC  Final   Culture NO GROWTH 3 DAYS  Final   Report Status PENDING  Incomplete  Blood Culture (routine x 2)     Status:  None (Preliminary result)   Collection Time: 09/07/15  2:50 PM  Result Value Ref Range Status   Specimen Description BLOOD LEFT ANTECUBITAL  Final   Special Requests BOTTLES DRAWN AEROBIC AND ANAEROBIC 5CC  Final   Culture NO GROWTH 3 DAYS  Final   Report Status PENDING  Incomplete  Urine culture     Status: None   Collection Time: 09/07/15  5:20 PM  Result Value Ref Range Status   Specimen Description URINE, CLEAN CATCH  Final   Special Requests NONE  Final   Culture MULTIPLE SPECIES PRESENT, SUGGEST RECOLLECTION  Final   Report Status 09/09/2015 FINAL  Final     Scheduled Meds: . amiodarone  200 mg Oral BID  . collagenase   Topical Daily  . diltiazem  240 mg Oral Daily  . insulin aspart  0-9 Units Subcutaneous TID WC  . levofloxacin  750 mg Oral Q24H  . nitroGLYCERIN  0.4 mg Sublingual Once  . pantoprazole  40 mg Oral Daily  . predniSONE  10 mg Oral Q breakfast  . sodium chloride flush  3 mL Intravenous Q12H  . thiamine  100 mg Oral Daily  . Warfarin - Pharmacist Dosing Inpatient   Does not apply q1800   Continuous Infusions: . sodium chloride 10 mL/hr at 09/09/15 [phone]

## 2015-09-11 NOTE — Progress Notes (Signed)
ANTICOAGULATION CONSULT NOTE - Follow Up Consult  Pharmacy Consult for Coumadin Indication: atrial fibrillation  Allergies  Allergen Reactions  . Ace Inhibitors Other (See Comments)    Causes high K  . Angiotensin Receptor Blockers Other (See Comments)    Causes high K  . Metoprolol Other (See Comments)    May cause elevated K level  . Penicillins Swelling and Rash    Has patient had a PCN reaction causing immediate rash, facial/tongue/throat swelling, SOB or lightheadedness with hypotension: YES Has patient had a PCN reaction causing severe rash involving mucus membranes or skin necrosis: NO Has patient had a PCN reaction that required hospitalization NO Has patient had a PCN reaction occurring within the last 10 years: NO If all of the above answers are "NO", then may proceed with Cephalosporin use.  . Tetracycline Swelling and Rash    Patient Measurements: Height: 5\' 8"  (172.7 cm) Weight: 180 lb 8.9 oz (81.9 kg) IBW/kg (Calculated) : 68.4   Vital Signs: Temp: 98.6 F (37 C) (04/01 0404) Temp Source: Oral (04/01 0404) BP: 120/77 mmHg (04/01 0404) Pulse Rate: 124 (04/01 0404)  Labs:  Recent Labs  09/09/15 0426 09/09/15 1058 09/09/15 1223 09/09/15 1608 09/10/15 0300 09/11/15 0359  HGB  --  10.6*  --   --   --   --   HCT  --  32.5*  --   --   --   --   PLT  --  257  --   --   --   --   LABPROT 17.9*  --   --   --  26.2* 28.2*  INR 1.47  --   --   --  2.44* 2.69*  CREATININE  --   --  1.39*  --   --   --   TROPONINI 0.04* 0.03  --  <0.03  --   --     Estimated Creatinine Clearance: 43.7 mL/min (by C-G formula based on Cr of 1.39).   Assessment: 39 yom with history of aFib on warfarin PTA. Pharmacy consulted to dose warfarin, held 3/28. Surgery note says no lap chole required this admission, restarted warfarin on 3/29 + lovenox 40mg  QHS. INR with large bump after 2nd dose 3/31. Now 2.44>2.69 after reduced dose last night. Hg 12.4 (up), plt wnl. No bleed. INR may  be difficult to control while on amio and levofloxacin.  -- PTA warf: 10 mg x2 days then 5 mg daily (new start?)  Goal of Therapy:  INR 2-3 Monitor platelets by anticoagulation protocol: Yes   Plan:  Coumadin 2mg  po x 1 Daily INR Monitor s/sx bleeding  Elicia Lamp, PharmD, [facility] Clinical Pharmacist Pager [phone] 09/11/2015 11:54 AM

## 2015-09-11 NOTE — Progress Notes (Signed)
    SUBJECTIVE:  No longer having chest pain.  Not SOB.  Back is OK.  Rate is still elevated.    PHYSICAL EXAM Filed Vitals:   09/10/15 0431 09/10/15 1613 09/10/15 2027 09/11/15 0404  BP: 110/73 113/73 117/68 120/77  Pulse: 124 128 110 124  Temp: 98 F (36.7 C)  97.7 F (36.5 C) 98.6 F (37 C)  TempSrc: Oral  Oral Oral  Resp: 20 21 20 20   Height:      Weight:      SpO2: 90% 91% 93% 92%   General:  No distress Lungs:  Clear Heart:  Irregular Abdomen:  Positive bowel sounds, no rebound no guarding Extremities:  No edema   LABS: Lab Results  Component Value Date   TROPONINI <0.03 09/09/2015   Results for orders placed or performed during the hospital encounter of 09/07/15 (from the past 24 hour(s))  Glucose, capillary     Status: Abnormal   Collection Time: 09/10/15 11:47 AM  Result Value Ref Range   Glucose-Capillary 176 (H) 65 - 99 mg/dL   Comment 1 Notify RN    Comment 2 Document in Chart   Glucose, capillary     Status: Abnormal   Collection Time: 09/10/15  4:17 PM  Result Value Ref Range   Glucose-Capillary 257 (H) 65 - 99 mg/dL   Comment 1 Notify RN    Comment 2 Document in Chart   Glucose, capillary     Status: Abnormal   Collection Time: 09/10/15  8:54 PM  Result Value Ref Range   Glucose-Capillary 190 (H) 65 - 99 mg/dL  Protime-INR     Status: Abnormal   Collection Time: 09/11/15  3:59 AM  Result Value Ref Range   Prothrombin Time 28.2 (H) 11.6 - 15.2 seconds   INR 2.69 (H) 0.00 - 1.49  Procalcitonin     Status: None   Collection Time: 09/11/15  4:46 AM  Result Value Ref Range   Procalcitonin 0.21 ng/mL  Glucose, capillary     Status: Abnormal   Collection Time: 09/11/15  6:22 AM  Result Value Ref Range   Glucose-Capillary 180 (H) 65 - 99 mg/dL    Intake/Output Summary (Last 24 hours) at 09/11/15 1124 Last data filed at 09/11/15 0800  Gross per 24 hour  Intake    840 ml  Output   1501 ml  Net   -661 ml     ASSESSMENT AND PLAN:  Chest  pain:  Not felt to be cardiac.  No work up indicated.  Atrial flutter:  Cardizem increased.  On warfarin.  He might need to have a TEE/DCCV early next week as his rate is still elevated and we don't have a great deal of room to move on meds with his lowish BP.   I will increase his amiodarone for now.     Jeneen Rinks [facility] 09/11/2015 11:24 AM

## 2015-09-12 DIAGNOSIS — M069 Rheumatoid arthritis, unspecified: Secondary | ICD-10-CM

## 2015-09-12 DIAGNOSIS — I4891 Unspecified atrial fibrillation: Secondary | ICD-10-CM | POA: Diagnosis present

## 2015-09-12 DIAGNOSIS — R079 Chest pain, unspecified: Secondary | ICD-10-CM | POA: Insufficient documentation

## 2015-09-12 DIAGNOSIS — I5033 Acute on chronic diastolic (congestive) heart failure: Secondary | ICD-10-CM | POA: Insufficient documentation

## 2015-09-12 LAB — BASIC METABOLIC PANEL
Anion gap: 9 (ref 5–15)
BUN: 6 mg/dL (ref 6–20)
CHLORIDE: 110 mmol/L (ref 101–111)
CO2: 22 mmol/L (ref 22–32)
CREATININE: 1.13 mg/dL (ref 0.61–1.24)
Calcium: 7.4 mg/dL — ABNORMAL LOW (ref 8.9–10.3)
Glucose, Bld: 189 mg/dL — ABNORMAL HIGH (ref 65–99)
Potassium: 3.7 mmol/L (ref 3.5–5.1)
SODIUM: 141 mmol/L (ref 135–145)

## 2015-09-12 LAB — CULTURE, BLOOD (ROUTINE X 2)
CULTURE: NO GROWTH
Culture: NO GROWTH

## 2015-09-12 LAB — PROTIME-INR
INR: 2.74 — AB (ref 0.00–1.49)
PROTHROMBIN TIME: 28.6 s — AB (ref 11.6–15.2)

## 2015-09-12 LAB — GLUCOSE, CAPILLARY
GLUCOSE-CAPILLARY: 201 mg/dL — AB (ref 65–99)
GLUCOSE-CAPILLARY: 218 mg/dL — AB (ref 65–99)
Glucose-Capillary: 262 mg/dL — ABNORMAL HIGH (ref 65–99)
Glucose-Capillary: 192 mg/dL — ABNORMAL HIGH (ref 65–99)

## 2015-09-12 LAB — BRAIN NATRIURETIC PEPTIDE: B Natriuretic Peptide: 1866.7 pg/mL — ABNORMAL HIGH (ref 0.0–100.0)

## 2015-09-12 LAB — CBC
HCT: 30 % — ABNORMAL LOW (ref 39.0–52.0)
HEMOGLOBIN: 9.4 g/dL — AB (ref 13.0–17.0)
MCH: 29.9 pg (ref 26.0–34.0)
MCHC: 31.3 g/dL (ref 30.0–36.0)
MCV: 95.5 fL (ref 78.0–100.0)
Platelets: 305 10*3/uL (ref 150–400)
RBC: 3.14 MIL/uL — AB (ref 4.22–5.81)
RDW: 15.8 % — ABNORMAL HIGH (ref 11.5–15.5)
WBC: 8.2 10*3/uL (ref 4.0–10.5)

## 2015-09-12 MED ORDER — WARFARIN SODIUM 3 MG PO TABS
3.0000 mg | ORAL_TABLET | Freq: Once | ORAL | Status: AC
  Administered 2015-09-12: 3 mg via ORAL
  Filled 2015-09-12: qty 1

## 2015-09-12 MED ORDER — FUROSEMIDE 10 MG/ML IJ SOLN
20.0000 mg | Freq: Two times a day (BID) | INTRAMUSCULAR | Status: DC
  Administered 2015-09-12 – 2015-09-15 (×6): 20 mg via INTRAVENOUS
  Filled 2015-09-12 (×6): qty 2

## 2015-09-12 NOTE — Progress Notes (Signed)
ANTICOAGULATION CONSULT NOTE - Follow Up Consult  Pharmacy Consult for Coumadin Indication: atrial fibrillation  Allergies  Allergen Reactions  . Ace Inhibitors Other (See Comments)    Causes high K  . Angiotensin Receptor Blockers Other (See Comments)    Causes high K  . Metoprolol Other (See Comments)    May cause elevated K level  . Penicillins Swelling and Rash    Has patient had a PCN reaction causing immediate rash, facial/tongue/throat swelling, SOB or lightheadedness with hypotension: YES Has patient had a PCN reaction causing severe rash involving mucus membranes or skin necrosis: NO Has patient had a PCN reaction that required hospitalization NO Has patient had a PCN reaction occurring within the last 10 years: NO If all of the above answers are "NO", then may proceed with Cephalosporin use.  . Tetracycline Swelling and Rash    Patient Measurements: Height: 5\' 8"  (172.7 cm) Weight: 180 lb 8.9 oz (81.9 kg) IBW/kg (Calculated) : 68.4   Vital Signs: Temp: 98.4 F (36.9 C) (04/02 0527) Temp Source: Oral (04/02 0527) BP: 118/73 mmHg (04/02 0527) Pulse Rate: 131 (04/02 0527)  Labs:  Recent Labs  09/09/15 1223 09/09/15 1608 09/10/15 0300 09/11/15 0359 09/12/15 0222  HGB  --   --   --   --  9.4*  HCT  --   --   --   --  30.0*  PLT  --   --   --   --  305  LABPROT  --   --  26.2* 28.2* 28.6*  INR  --   --  2.44* 2.69* 2.74*  CREATININE 1.39*  --   --   --  1.13  TROPONINI  --  <0.03  --   --   --     Estimated Creatinine Clearance: 53.8 mL/min (by C-G formula based on Cr of 1.13).   Assessment: 73 yom with history of aFib on warfarin PTA. Pharmacy consulted to dose warfarin, held 3/28. Surgery note says no lap chole required this admission, restarted warfarin on 3/29. INR with large bump after 2nd dose 3/31. Now stable 2.74 after reduced doses last 2 nights. Hg down 9.4, plt wnl. No bleed. INR may be difficult to control while on amio and levofloxacin.  --  PTA warf: 10 mg x2 days then 5 mg daily (new start?)  Goal of Therapy:  INR 2-3 Monitor platelets by anticoagulation protocol: Yes   Plan:  Coumadin 3mg  po x 1 Daily INR Monitor s/sx bleeding  Elicia Lamp, PharmD, [facility] Clinical Pharmacist Pager [phone] 09/12/2015 11:49 AM

## 2015-09-12 NOTE — Progress Notes (Addendum)
Patient ID: Curtis Frazier, male   DOB: 1941-02-23, 75 y.o.   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 Patient also has history of lumbar incision status post laminectomy earlier in February 2016. He was just discharged 3/27 after being in hospital for chest pain evaluation. Cardiology was consulted at that time and their recommendation was to continue medical management because patient's chest pain resolved.  He presented to [facility] with reports of epigastric and chest pain that radiates to the back, 10 out of 10 in intensity and with associated shortness of breath especially after ambulation.  On admission, he was hemodynamically stable. Initial thought was that he may have gallbladder related pain. HIDA scan was done and showed no acute cholecystitis. Liver enzymes are all within normal limits.  Overnight, patient complained of severe chest pain. He does have minimal elevation in troponin which seems to have been present even on past admission, 0.04. History of lead EKG last night showed atrial flutter. Cardiology consulted for input on management.  Assessment/Plan:    Principal Problem: Chest pain with exertion / mild troponin elevation - Minimal elevation in troponin on admission, but now WNL - 2-D echo on this admission with ejection fraction of [id number], grade 1 diastolic dysfunction, normal wall motion - Cardio adjusting medical management: patient on Cardizem and amiodarone; despite adjustments; patient's HR remains elevated and fluctuating (at rest; even higher on exertion). -currently w/o CP, but has expressed some intermittent discomfort  -given complaints of mild SOB, BNP has been ordered and demonstrated levels  in 1800 range suggesting component of CHF exacerbation. -will follow card's rec's -will start flutter valve, continue IS and provide PRN oxygen supplementation   Active Problems: Atrial fibrillation, chronic; with acute uncontrolled episode and RVR - Chads Vasc score 5 - Continue coumadin for AC - Continue amiodarone and Cardizem for HR control -cardiology on board and planning to performed cardioversion on 4/3     Acute on chronic diastolic HF -started on lasix -cardiology on board helping controlling A. Fib -most likely due to uncontrolled A. Fib   Lobar pneumonia, unspecified organism  -no fever and WBC's WNL - Continue Levaquin, started 3/30   Symptomatic cholelithiasis - HIDA scan normal - LFTs WNL, lipase WNL - Tolerating regular diet  - Since no acute cholecystitis surgery has signed off   History of laminectomy - Appreciate wound care assessment. - Wound measurement: 1cm x 0.3cm x 0.5cm  - Per wound care evaluation, wound has not yet healed at this point. The patient is past 2 week follow-up with neurosurgery. - Outpt follow up after discharge will be needed  -follow wound care rec's; pain in his back is overall stable  Rheumatoid arthritis ([place]) / PMR (polymyalgia rheumatica) (HCC) - Continue prednisone per home dose  -reports joint pain is stable   Anemia of chronic disease  - Related to history of rheumatoid arthritis and polymyalgia rheumatica as well as chronic kidney disease  - Hemoglobin stable at 10.6 -will monitor trend   Chronic kidney disease stage III - Recent baseline creatinine 1.8  - Creatinine within baseline range and even better, 1.39 (during this admission) -will monitor and minimize nephrotoxic agents   DVT Prophylaxis  - Lovenox subQ  Code Status: Full.  Family Communication:  plan of care discussed with the patient; spoke with his Son at bedside Disposition Plan: home once medically stable, plan is for TEE/cardioversion on 09/13/15;  follow cardiology rec's  IV access:  Peripheral IV  Procedures and diagnostic studies:    Nm Hepatobiliary Liver Func 09/08/2015   No evidence of cystic duct or common bile duct obstruction. Electronically Signed   By: Rolm Baptise M.D.   On: 09/08/2015 09:24   Medical Consultants:  Surgery [facility]  Other Consultants:  None  IAnti-Infectives:   Levaquin 09/09/2015 -->    Curtis Dubois, MD  [facility] Pager [phone]  Time spent in minutes: 25 minutes  If 7PM-7AM, please contact night-coverage [email or web address] Password [facility] 09/12/2015, 8:31 PM   LOS: 5 days    HPI/Subjective: Afebrile, reports being more SOB this morning. No CP currently.   Objective: Filed Vitals:   09/11/15 2007 09/12/15 0527 09/12/15 0531 09/12/15 1158  BP: 117/79 118/73  142/78  Pulse: 104 131  91  Temp: 98.1 F (36.7 C) 98.4 F (36.9 C)  98.7 F (37.1 C)  TempSrc: Oral Oral  Oral  Resp: 22 20  22   Height:      Weight:      SpO2: 96% 90% 92% 96%    Intake/Output Summary (Last 24 hours) at 09/12/15 2031 Last data filed at 09/12/15 1832  Gross per 24 hour  Intake    480 ml  Output    820 ml  Net   -340 ml    Exam:   General:  Pt is awake, complaining of some increase SOB. Denies CP and is afebirle.  Cardiovascular:irregular/irregular; positive SEM, no rubs or gallops  Respiratory:positive rhonchi, no wheezing   Abdomen: non tender, non distended, (+) BS  Extremities: trace edema bilaterally, palpable pulses   Neuro: No focal deficits  Data Reviewed: Basic Metabolic Panel:  Recent Labs Lab 09/07/15 1037 09/07/15 1450 09/07/15 1826 09/08/15 0326 09/09/15 1223 09/12/15 0222  NA 135 134*  --  138 137 141  K 4.4 4.3  --  4.6 4.5 3.7  CL 105 108  --  114* 110 110  CO2 21* 18*  --  16* 16* 22  GLUCOSE 199* 140*  --  145* 256* 189*  BUN 15 16  --  12 11 6   CREATININE 1.66* 1.62*  --  1.35* 1.39* 1.13  CALCIUM 8.1* 7.8*  --  7.1* 7.3* 7.4*  MG  --   --  1.4*   --  2.0  --    Liver Function Tests:  Recent Labs Lab 09/07/15 1037 09/07/15 1450 09/08/15 0326  AST 16 16 18   ALT 19 17 15*  ALKPHOS 55 50 49  BILITOT 0.9 0.9 0.9  PROT 5.8* 5.3* 4.6*  ALBUMIN 2.5* 2.3* 2.1*    Recent Labs Lab 09/07/15 1037  LIPASE 26   CBC:  Recent Labs Lab 09/07/15 1037 09/07/15 1450 09/08/15 0326 09/09/15 1058 09/12/15 0222  WBC 9.8 6.7 7.9 8.9 8.2  NEUTROABS  --  4.5  --   --   --   HGB 10.1* 13.3 12.4* 10.6* 9.4*  HCT 32.5* 41.4 37.9* 32.5* 30.0*  MCV 98.2 97.2 98.4 97.6 95.5  PLT 247 154 168 257 305   Cardiac Enzymes:  Recent Labs Lab 09/07/15 1417 09/07/15 2343 09/09/15 0426 09/09/15 1058 09/09/15 1608  TROPONINI 0.04* 0.03 0.04* 0.03 <0.03   CBG:  Recent Labs Lab 09/11/15 1623 09/11/15 2142 09/12/15 [id number] 09/12/15  1300 09/12/15 1642  GLUCAP 267* 174* 201* 262* 192*    Recent Results (from the past 240 hour(s))  Blood Culture (routine x 2)     Status: None   Collection Time: 09/07/15  2:40 PM  Result Value Ref Range Status   Specimen Description BLOOD RIGHT HAND  Final   Special Requests BOTTLES DRAWN AEROBIC AND ANAEROBIC 5CC  Final   Culture NO GROWTH 5 DAYS  Final   Report Status 09/12/2015 FINAL  Final  Blood Culture (routine x 2)     Status: None   Collection Time: 09/07/15  2:50 PM  Result Value Ref Range Status   Specimen Description BLOOD LEFT ANTECUBITAL  Final   Special Requests BOTTLES DRAWN AEROBIC AND ANAEROBIC 5CC  Final   Culture NO GROWTH 5 DAYS  Final   Report Status 09/12/2015 FINAL  Final  Urine culture     Status: None   Collection Time: 09/07/15  5:20 PM  Result Value Ref Range Status   Specimen Description URINE, CLEAN CATCH  Final   Special Requests NONE  Final   Culture MULTIPLE SPECIES PRESENT, SUGGEST RECOLLECTION  Final   Report Status 09/09/2015 FINAL  Final     Scheduled Meds: . amiodarone  400 mg Oral BID  . collagenase   Topical Daily  . diltiazem  240 mg Oral Daily  .  insulin aspart  0-9 Units Subcutaneous TID WC  . levofloxacin  750 mg Oral Q24H  . nitroGLYCERIN  0.4 mg Sublingual Once  . pantoprazole  40 mg Oral Daily  . predniSONE  10 mg Oral Q breakfast  . sodium chloride flush  3 mL Intravenous Q12H  . thiamine  100 mg Oral Daily  . Warfarin - Pharmacist Dosing Inpatient   Does not apply q1800   Continuous Infusions: . sodium chloride 10 mL/hr at 09/11/15 2232

## 2015-09-12 NOTE — Progress Notes (Signed)
SUBJECTIVE:  No longer having chest pain.  Not SOB.  Back is OK.  Rate is still elevated.    PHYSICAL EXAM Filed Vitals:   09/11/15 1214 09/11/15 2007 09/12/15 0527 09/12/15 0531  BP: 123/62 117/79 118/73   Pulse: 101 104 131   Temp: 98.2 F (36.8 C) 98.1 F (36.7 C) 98.4 F (36.9 C)   TempSrc: Oral Oral Oral   Resp: 21 22 20    Height:      Weight:      SpO2: 97% 96% 90% 92%   General:  No distress Lungs:  Clear Heart:  Irregular Abdomen:  Positive bowel sounds, no rebound no guarding Extremities:  No edema   LABS: Lab Results  Component Value Date   TROPONINI <0.03 09/09/2015   Results for orders placed or performed during the hospital encounter of 09/07/15 (from the past 24 hour(s))  Glucose, capillary     Status: Abnormal   Collection Time: 09/11/15 11:19 AM  Result Value Ref Range   Glucose-Capillary 197 (H) 65 - 99 mg/dL   Comment 1 Notify RN    Comment 2 Document in Chart   Glucose, capillary     Status: Abnormal   Collection Time: 09/11/15  4:23 PM  Result Value Ref Range   Glucose-Capillary 267 (H) 65 - 99 mg/dL   Comment 1 Notify RN    Comment 2 Document in Chart   Glucose, capillary     Status: Abnormal   Collection Time: 09/11/15  9:42 PM  Result Value Ref Range   Glucose-Capillary 174 (H) 65 - 99 mg/dL  Protime-INR     Status: Abnormal   Collection Time: 09/12/15  2:22 AM  Result Value Ref Range   Prothrombin Time 28.6 (H) 11.6 - 15.2 seconds   INR 2.74 (H) 0.00 - 1.49  CBC     Status: Abnormal   Collection Time: 09/12/15  2:22 AM  Result Value Ref Range   WBC 8.2 4.0 - 10.5 K/uL   RBC 3.14 (L) 4.22 - 5.81 MIL/uL   Hemoglobin 9.4 (L) 13.0 - 17.0 g/dL   HCT 30.0 (L) 39.0 - 52.0 %   MCV 95.5 78.0 - 100.0 fL   MCH 29.9 26.0 - 34.0 pg   MCHC 31.3 30.0 - 36.0 g/dL   RDW 15.8 (H) 11.5 - 15.5 %   Platelets 305 150 - 400 K/uL  Basic metabolic panel     Status: Abnormal   Collection Time: 09/12/15  2:22 AM  Result Value Ref Range   Sodium 141  135 - 145 mmol/L   Potassium 3.7 3.5 - 5.1 mmol/L   Chloride 110 101 - 111 mmol/L   CO2 22 22 - 32 mmol/L   Glucose, Bld 189 (H) 65 - 99 mg/dL   BUN 6 6 - 20 mg/dL   Creatinine, Ser 1.13 0.61 - 1.24 mg/dL   Calcium 7.4 (L) 8.9 - 10.3 mg/dL   GFR calc non Af Amer >60 >60 mL/min   GFR calc Af Amer >60 >60 mL/min   Anion gap 9 5 - 15  Glucose, capillary     Status: Abnormal   Collection Time: 09/12/15  6:33 AM  Result Value Ref Range   Glucose-Capillary 201 (H) 65 - 99 mg/dL    Intake/Output Summary (Last 24 hours) at 09/12/15 0733 Last data filed at 09/12/15 0533  Gross per 24 hour  Intake    720 ml  Output   1370 ml  Net   -  650 ml     ASSESSMENT AND PLAN:  Chest pain:  Not felt to be cardiac.  No work up indicated.     Atrial flutter:  Amiodarone was increased yesterday.  However, rate is still elevated.   Rate control is not successful.  He will need cardioversion.  However, given recent subtherapeutic INR he will need TEE/DCCV.  I will keep him NPO after MN.  However, this study has not been scheduled.  No orders have been written.  I sent a note to our staff to try to schedule this for tomorrow at which point orders would need to be written.   Dyspnea:  He says that he is more SOB.  I will order a BNP.    Curtis Frazier 09/12/2015 7:33 AM

## 2015-09-12 NOTE — Progress Notes (Signed)
PT Cancellation Note  Patient Details Name: Curtis Frazier MRN: [id number] DOB: [birth year]   Cancelled Treatment:    Reason Eval/Treat Not Completed: Medical issues which prohibited therapy- note HR remains elevated and plans for cardioversion.  Will continue to hold PT. MD please advise for activity post-procedure and we will follow up.  Thank you,    Herbie Drape 09/12/2015, 9:33 AM

## 2015-09-13 DIAGNOSIS — I5033 Acute on chronic diastolic (congestive) heart failure: Secondary | ICD-10-CM

## 2015-09-13 DIAGNOSIS — I4891 Unspecified atrial fibrillation: Secondary | ICD-10-CM

## 2015-09-13 LAB — BASIC METABOLIC PANEL
ANION GAP: 6 (ref 5–15)
ANION GAP: 16 — AB (ref 5–15)
BUN: 8 mg/dL (ref 6–20)
BUN: 8 mg/dL (ref 6–20)
CALCIUM: 7.2 mg/dL — AB (ref 8.9–10.3)
CHLORIDE: 108 mmol/L (ref 101–111)
CO2: 23 mmol/L (ref 22–32)
CO2: 20 mmol/L — ABNORMAL LOW (ref 22–32)
Calcium: 7.7 mg/dL — ABNORMAL LOW (ref 8.9–10.3)
Chloride: 103 mmol/L (ref 101–111)
Creatinine, Ser: 1.24 mg/dL (ref 0.61–1.24)
Creatinine, Ser: 1.08 mg/dL (ref 0.61–1.24)
GFR calc non Af Amer: 55 mL/min — ABNORMAL LOW (ref >60)
GLUCOSE: 199 mg/dL — AB (ref 65–99)
Glucose, Bld: 215 mg/dL — ABNORMAL HIGH (ref 65–99)
POTASSIUM: 3.6 mmol/L (ref 3.5–5.1)
POTASSIUM: 3.2 mmol/L — AB (ref 3.5–5.1)
Sodium: 137 mmol/L (ref 135–145)
Sodium: 139 mmol/L (ref 135–145)

## 2015-09-13 LAB — CBC
HEMATOCRIT: 30.4 % — AB (ref 39.0–52.0)
HEMOGLOBIN: 10.1 g/dL — AB (ref 13.0–17.0)
MCH: 31.7 pg (ref 26.0–34.0)
MCHC: 33.2 g/dL (ref 30.0–36.0)
MCV: 95.3 fL (ref 78.0–100.0)
Platelets: 302 10*3/uL (ref 150–400)
RBC: 3.19 MIL/uL — AB (ref 4.22–5.81)
RDW: 15.6 % — ABNORMAL HIGH (ref 11.5–15.5)
WBC: 8.5 10*3/uL (ref 4.0–10.5)

## 2015-09-13 LAB — GLUCOSE, CAPILLARY
GLUCOSE-CAPILLARY: 194 mg/dL — AB (ref 65–99)
GLUCOSE-CAPILLARY: 252 mg/dL — AB (ref 65–99)
GLUCOSE-CAPILLARY: 210 mg/dL — AB (ref 65–99)
Glucose-Capillary: 243 mg/dL — ABNORMAL HIGH (ref 65–99)

## 2015-09-13 LAB — PROTIME-INR
INR: 3.33 — AB (ref 0.00–1.49)
Prothrombin Time: 33.1 seconds — ABNORMAL HIGH (ref 11.6–15.2)

## 2015-09-13 NOTE — Progress Notes (Signed)
ANTICOAGULATION CONSULT NOTE - Follow Up Consult  Pharmacy Consult for Coumadin Indication: atrial fibrillation  Allergies  Allergen Reactions  . Ace Inhibitors Other (See Comments)    Causes high K  . Angiotensin Receptor Blockers Other (See Comments)    Causes high K  . Metoprolol Other (See Comments)    May cause elevated K level  . Penicillins Swelling and Rash    Has patient had a PCN reaction causing immediate rash, facial/tongue/throat swelling, SOB or lightheadedness with hypotension: YES Has patient had a PCN reaction causing severe rash involving mucus membranes or skin necrosis: NO Has patient had a PCN reaction that required hospitalization NO Has patient had a PCN reaction occurring within the last 10 years: NO If all of the above answers are "NO", then may proceed with Cephalosporin use.  . Tetracycline Swelling and Rash    Patient Measurements: Height: 5\' 8"  (172.7 cm) Weight: 180 lb 8.9 oz (81.9 kg) IBW/kg (Calculated) : 68.4   Vital Signs: Temp: 98.5 F (36.9 C) (04/03 0500) Temp Source: Oral (04/03 0500) BP: 126/74 mmHg (04/03 0500) Pulse Rate: 112 (04/03 0500)  Labs:  Recent Labs  09/11/15 0359 09/12/15 0222 09/13/15 0248  HGB  --  9.4* 10.1*  HCT  --  30.0* 30.4*  PLT  --  305 302  LABPROT 28.2* 28.6* 33.1*  INR 2.69* 2.74* 3.33*  CREATININE  --  1.13 1.24    Estimated Creatinine Clearance: 49 mL/min (by C-G formula based on Cr of 1.24).   Assessment: 89 yom with history of aFib on warfarin PTA. Pharmacy consulted to dose warfarin, held 3/28. Surgery note says no lap chole required this admission, restarted warfarin on 3/29. INR= 3.33 with trend up (possibly due to levaquin). Plans noted for DCCV in am.   -- PTA warf: 10 mg x2 days then 5 mg daily (new start?)  Goal of Therapy:  INR 2-3 Monitor platelets by anticoagulation protocol: Yes   Plan:  Hold coumadin today Daily INR  Hildred Laser, Pharm D 09/13/2015 8:10 AM   w

## 2015-09-13 NOTE — Progress Notes (Addendum)
Patient ID: Curtis Frazier, male   DOB: Oct 12, 1940, 75 y.o.   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 Patient also has history of lumbar incision status post laminectomy earlier in February 2016. He was just discharged 3/27 after being in hospital for chest pain evaluation. Cardiology was consulted at that time and their recommendation was to continue medical management because patient's chest pain resolved.  He presented to [facility] with reports of epigastric and chest pain that radiates to the back, 10 out of 10 in intensity and with associated shortness of breath especially after ambulation.  On admission, he was hemodynamically stable. Initial thought was that he may have gallbladder related pain. HIDA scan was done and showed no acute cholecystitis. Liver enzymes are all within normal limits.  During this hospital stay, patient was seen by cardiology because of atrial fibrillation with RVR. Increasing amiodarone and adding Cardizem did not result in significant HR control.   Assessment/Plan:    Principal Problem: Chest pain with exertion / mild troponin elevation - Minimal elevation in troponin on admission, but now WNL - 2-D echo on this admission with ejection fraction of [id number], grade 1 diastolic dysfunction, normal wall motion - Cardio adjusting medical management: patient on Cardizem and amiodarone; despite adjustments; patient's HR remains elevated and fluctuating (at rest; even higher on exertion).  Active Problems: Atrial fibrillation, chronic; with acute uncontrolled episode and RVR - Chads Vasc score 5 - Continue coumadin for AC - Continue amiodarone and Cardizem for HR control - Patient will require  cardioversion per cardiology   Acute on chronic diastolic HF - Appreciate cardiology following - Per cardiology, Lasix started 20 mg IV every 12 hours  Lobar pneumonia, unspecified organism  - Was on Levaquin, started 3/30 --> 09/12/2015  Symptomatic cholelithiasis - HIDA scan normal - LFTs WNL, lipase WNL - Tolerating regular diet  - Since no acute cholecystitis surgery has signed off   History of laminectomy - Appreciate wound care assessment. - Wound measurement: 1cm x 0.3cm x 0.5cm  - Per wound care evaluation, wound has not yet healed at this point. The patient is past 2 week follow-up with neurosurgery. - Outpt follow up after discharge will be needed   Rheumatoid arthritis (Curtis Frazier) / PMR (polymyalgia rheumatica) (HCC) - Continue prednisone per home dose   Anemia of chronic disease  - Related to history of rheumatoid arthritis and polymyalgia rheumatica as well as chronic kidney disease  - Hemoglobin stable  Chronic kidney disease stage III - Recent baseline creatinine 1.8  - Creatinine within baseline range  DVT Prophylaxis  - Lovenox subQ in hospital  Code Status: Full.  Family Communication:  plan of care discussed with the patient; spoke with his Son at bedside Disposition Plan: home once medically stable, plan is for TEE/cardioversion on 09/13/15  IV access:  Peripheral IV  Procedures and diagnostic studies:    Nm Hepatobiliary Liver Func 09/08/2015   No evidence of cystic duct or common bile duct obstruction. Electronically Signed   By: Curtis Frazier M.D.   On: 09/08/2015 09:24   Medical Consultants:  Surgery Cardiology  Other Consultants:  None  IAnti-Infectives:   Levaquin 09/09/2015 --> 09/12/2015   Curtis Lenz, MD  [facility] Pager [phone]  Time spent in minutes: 25 minutes  If 7PM-7AM, please contact night-coverage [email or web address] Password [email or web address] 09/13/2015, 12:09 PM   LOS: 6 days    HPI/Subjective: No acute overnight events. No chest  pains.  Objective: Filed Vitals:   09/12/15 0531 09/12/15 1158 09/12/15 2205 09/13/15 0500  BP:  142/78 131/66 126/74  Pulse:  91 96 112  Temp:  98.7 F (37.1 C) 98.1 F (36.7 C) 98.5 F (36.9 C)  TempSrc:  Oral Oral Oral  Resp:  22 24 20   Height:      Weight:      SpO2: 92% 96% 96% 92%    Intake/Output Summary (Last 24 hours) at 09/13/15 1209 Last data filed at 09/13/15 0730  Gross per 24 hour  Intake    360 ml  Output   1800 ml  Net  -1440 ml    Exam:   General:  Alert, no distress  Cardiovascular: Tachycardic, appreciate S1-S2  Respiratory: No wheezing, no rhonchi  Abdomen: Appreciate bowel sounds, no tenderness  Extremities: Bilateral pulses, no swelling  Neuro: Nonfocal  Data Reviewed: Basic Metabolic Panel:  Recent Labs Lab 09/07/15 1826 09/08/15 0326 09/09/15 1223 09/12/15 0222 09/13/15 0248 09/13/15 1026  NA  --  138 137 141 137 139  K  --  4.6 4.5 3.7 3.6 3.2*  CL  --  114* 110 110 108 103  CO2  --  16* 16* 22 23 20*  GLUCOSE  --  145* 256* 189* 215* 199*  BUN  --  12 11 6 8 8   CREATININE  --  1.35* 1.39* 1.13 1.24 1.08  CALCIUM  --  7.1* 7.3* 7.4* 7.2* 7.7*  MG 1.4*  --  2.0  --   --   --    Liver Function Tests:  Recent Labs Lab 09/07/15 1037 09/07/15 1450 09/08/15 0326  AST 16 16 18   ALT 19 17 15*  ALKPHOS 55 50 49  BILITOT 0.9 0.9 0.9  PROT 5.8* 5.3* 4.6*  ALBUMIN 2.5* 2.3* 2.1*    Recent Labs Lab 09/07/15 1037  LIPASE 26   CBC:  Recent Labs Lab 09/07/15 1450 09/08/15 0326 09/09/15 1058 09/12/15 0222 09/13/15 0248  WBC 6.7 7.9 8.9 8.2 8.5  NEUTROABS 4.5  --   --   --   --   HGB 13.3 12.4* 10.6* 9.4* 10.1*  HCT 41.4 37.9* 32.5* 30.0* 30.4*  MCV 97.2 98.4 97.6 95.5 95.3  PLT 154 168 257 305 302   Cardiac Enzymes:  Recent Labs Lab 09/07/15 1417 09/07/15 2343 09/09/15 0426 09/09/15 1058 09/09/15 1608  TROPONINI 0.04* 0.03 0.04* 0.03 <0.03   CBG:  Recent Labs Lab 09/12/15 1300 09/12/15 1642  09/12/15 2128 09/13/15 0629 09/13/15 1114  GLUCAP 262* 192* 218* 194* 243*    Recent Results (from the past 240 hour(s))  Blood Culture (routine x 2)     Status: None   Collection Time: 09/07/15  2:40 PM  Result Value Ref Range Status   Specimen Description BLOOD RIGHT HAND  Final   Special Requests BOTTLES DRAWN AEROBIC AND ANAEROBIC 5CC  Final   Culture NO GROWTH 5 DAYS  Final   Report Status 09/12/2015 FINAL  Final  Blood Culture (routine x 2)     Status: None   Collection Time: 09/07/15  2:50 PM  Result Value Ref Range Status   Specimen Description BLOOD LEFT ANTECUBITAL  Final   Special Requests BOTTLES DRAWN AEROBIC AND ANAEROBIC 5CC  Final  Culture NO GROWTH 5 DAYS  Final   Report Status 09/12/2015 FINAL  Final  Urine culture     Status: None   Collection Time: 09/07/15  5:20 PM  Result Value Ref Range Status   Specimen Description URINE, CLEAN CATCH  Final   Special Requests NONE  Final   Culture MULTIPLE SPECIES PRESENT, SUGGEST RECOLLECTION  Final   Report Status 09/09/2015 FINAL  Final     Scheduled Meds: . amiodarone  400 mg Oral BID  . collagenase   Topical Daily  . diltiazem  240 mg Oral Daily  . furosemide  20 mg Intravenous Q12H  . insulin aspart  0-9 Units Subcutaneous TID WC  . nitroGLYCERIN  0.4 mg Sublingual Once  . pantoprazole  40 mg Oral Daily  . predniSONE  10 mg Oral Q breakfast  . sodium chloride flush  3 mL Intravenous Q12H  . thiamine  100 mg Oral Daily  . Warfarin - Pharmacist Dosing Inpatient   Does not apply q1800   Continuous Infusions: . sodium chloride 10 mL/hr at 09/11/15 2232

## 2015-09-13 NOTE — Progress Notes (Signed)
PT Cancellation Note  Patient Details Name: Curtis Frazier MRN: [id number] DOB: 02-09-1941   Cancelled Treatment:    Reason Eval/Treat Not Completed: Medical issues which prohibited therapy; patient refused due to still too weak and with severe SOB just up to chair, then back to bed, wishes to hold on PT till after cardioversion procedure tomorrow.  Will attempt again at that time.   Reginia Naas 09/13/2015, 1:55 PM  Magda Kiel, [place] 09/13/2015

## 2015-09-13 NOTE — Progress Notes (Signed)
Patient ID: Curtis Frazier, male   DOB: 1940-10-07, 75 y.o.   MRN: [id number]    SUBJECTIVE:  No longer having chest pain.  Not SOB.  No palpitations    PHYSICAL EXAM Filed Vitals:   09/12/15 0531 09/12/15 1158 09/12/15 2205 09/13/15 0500  BP:  142/78 131/66 126/74  Pulse:  91 96 112  Temp:  98.7 F (37.1 C) 98.1 F (36.7 C) 98.5 F (36.9 C)  TempSrc:  Oral Oral Oral  Resp:  22 24 20   Height:      Weight:      SpO2: 92% 96% 96% 92%   General:  No distress Lungs:  Clear Heart:  Irregular  AS murmur  Abdomen:  Positive bowel sounds, no rebound no guarding Extremities:  No edema   LABS: Lab Results  Component Value Date   TROPONINI <0.03 09/09/2015   Results for orders placed or performed during the hospital encounter of 09/07/15 (from the past 24 hour(s))  Glucose, capillary     Status: Abnormal   Collection Time: 09/12/15  1:00 PM  Result Value Ref Range   Glucose-Capillary 262 (H) 65 - 99 mg/dL  Glucose, capillary     Status: Abnormal   Collection Time: 09/12/15  4:42 PM  Result Value Ref Range   Glucose-Capillary 192 (H) 65 - 99 mg/dL   Comment 1 Notify RN    Comment 2 Document in Chart   Glucose, capillary     Status: Abnormal   Collection Time: 09/12/15  9:28 PM  Result Value Ref Range   Glucose-Capillary 218 (H) 65 - 99 mg/dL  Protime-INR     Status: Abnormal   Collection Time: 09/13/15  2:48 AM  Result Value Ref Range   Prothrombin Time 33.1 (H) 11.6 - 15.2 seconds   INR 3.33 (H) 0.00 - 1.49  CBC     Status: Abnormal   Collection Time: 09/13/15  2:48 AM  Result Value Ref Range   WBC 8.5 4.0 - 10.5 K/uL   RBC 3.19 (L) 4.22 - 5.81 MIL/uL   Hemoglobin 10.1 (L) 13.0 - 17.0 g/dL   HCT 30.4 (L) 39.0 - 52.0 %   MCV 95.3 78.0 - 100.0 fL   MCH 31.7 26.0 - 34.0 pg   MCHC 33.2 30.0 - 36.0 g/dL   RDW 15.6 (H) 11.5 - 15.5 %   Platelets 302 150 - 400 K/uL  Basic metabolic panel     Status: Abnormal   Collection Time: 09/13/15  2:48 AM  Result Value Ref Range   Sodium 137 135 - 145 mmol/L   Potassium 3.6 3.5 - 5.1 mmol/L   Chloride 108 101 - 111 mmol/L   CO2 23 22 - 32 mmol/L   Glucose, Bld 215 (H) 65 - 99 mg/dL   BUN 8 6 - 20 mg/dL   Creatinine, Ser 1.24 0.61 - 1.24 mg/dL   Calcium 7.2 (L) 8.9 - 10.3 mg/dL   GFR calc non Af Amer 55 (L) >60 mL/min   GFR calc Af Amer >60 >60 mL/min   Anion gap 6 5 - 15  Glucose, capillary     Status: Abnormal   Collection Time: 09/13/15  6:29 AM  Result Value Ref Range   Glucose-Capillary 194 (H) 65 - 99 mg/dL    Intake/Output Summary (Last 24 hours) at 09/13/15 0847 Last data filed at 09/13/15 0334  Gross per 24 hour  Intake    120 ml  Output   1800 ml  Net  -  1680 ml    Current facility-administered medications:  .  0.9 %  sodium chloride infusion, , Intravenous, Continuous, Robbie Lis, MD, Last Rate: 10 mL/hr at 09/11/15 2232 .  acetaminophen (TYLENOL) tablet 650 mg, 650 mg, Oral, Q6H PRN, 650 mg at 09/12/15 2249 **OR** acetaminophen (TYLENOL) suppository 650 mg, 650 mg, Rectal, Q6H PRN, Samella Parr, NP .  amiodarone (PACERONE) tablet 400 mg, 400 mg, Oral, BID, Minus Breeding, MD, 400 mg at 09/12/15 2015 .  collagenase (SANTYL) ointment, , Topical, Daily, Robbie Lis, MD .  diltiazem (CARDIZEM CD) 24 hr capsule 240 mg, 240 mg, Oral, Daily, Troy Sine, MD, 240 mg at 09/12/15 0939 .  furosemide (LASIX) injection 20 mg, 20 mg, Intravenous, Q12H, Barton Dubois, MD, 20 mg at 09/12/15 2132 .  insulin aspart (novoLOG) injection 0-9 Units, 0-9 Units, Subcutaneous, TID WC, Robbie Lis, MD, 2 Units at 09/13/15 [phone] .  levofloxacin (LEVAQUIN) tablet 750 mg, 750 mg, Oral, Q24H, Robbie Lis, MD, 750 mg at 09/12/15 0941 .  nitroGLYCERIN (NITROSTAT) SL tablet 0.4 mg, 0.4 mg, Sublingual, Once, Jeryl Columbia, NP, 0.4 mg at 09/09/15 0433 .  ondansetron (ZOFRAN) tablet 4 mg, 4 mg, Oral, Q6H PRN **OR** ondansetron (ZOFRAN) injection 4 mg, 4 mg, Intravenous, Q6H PRN, Samella Parr, NP .  pantoprazole  (PROTONIX) EC tablet 40 mg, 40 mg, Oral, Daily, Samella Parr, NP, 40 mg at 09/12/15 0940 .  predniSONE (DELTASONE) tablet 10 mg, 10 mg, Oral, Q breakfast, Robbie Lis, MD, 10 mg at 09/12/15 0940 .  sodium chloride flush (NS) 0.9 % injection 3 mL, 3 mL, Intravenous, Q12H, Samella Parr, NP, 3 mL at 09/12/15 2200 .  thiamine (VITAMIN B-1) tablet 100 mg, 100 mg, Oral, Daily, Samella Parr, NP, 100 mg at 09/12/15 0939 .  Warfarin - Pharmacist Dosing Inpatient, , Does not apply, q1800, Karren Cobble, RPH, Stopped at 09/13/15 1800  ASSESSMENT AND PLAN:  Chest pain:  Not felt to be cardiac.  No work up indicated.     Atrial flutter:  On amiodarone   TEE/DCCV. Cannot be done today No anesthesia coverage.  Have scheduled for Tuesday 11:00 with Dr Marlou Porch Orders written will resume diet today  Dyspnea:  He says that he is more SOB. BNP 1800 good diuresis  Will benefit from AV synchrony post [facility] will give another dose lasix today Echo with EF 55-60% but moderate AS    Jenkins Rouge 09/13/2015 8:47 AM

## 2015-09-13 NOTE — Care Management Important Message (Signed)
Important Message  Patient Details  Name: Curtis Frazier MRN: [id number] Date of Birth: September 28, 1940   Medicare Important Message Given:  Yes    [name] May 09/13/2015, 12:03 PM

## 2015-09-13 NOTE — Progress Notes (Signed)
Results for Curtis Frazier, Curtis Frazier (MRN [id number]) as of 09/13/2015 12:35  Ref. Range 09/12/2015 13:00 09/12/2015 16:42 09/12/2015 21:28 09/13/2015 06:29 09/13/2015 11:14  Glucose-Capillary Latest Ref Range: 65-99 mg/dL 262 (H) 192 (H) 218 (H) 194 (H) 243 (H)  Noted that CBGs continue to be greater than 180 mg/ml.  May want to consider increasing Novolog correction scale to MODERATE TID if blood sugars continue to be elevated.  Will continue to monitor blood sugars in the hospital. Harvel Ricks RN BSN CDE

## 2015-09-14 ENCOUNTER — Inpatient Hospital Stay (HOSPITAL_COMMUNITY): Payer: Medicare Other | Admitting: Anesthesiology

## 2015-09-14 ENCOUNTER — Encounter (HOSPITAL_COMMUNITY)
Admission: EM | Disposition: A | Discharge status: Home Health | Payer: Self-pay | Source: Home / Self Care | Attending: Internal Medicine

## 2015-09-14 ENCOUNTER — Encounter (HOSPITAL_COMMUNITY): Payer: Self-pay | Admitting: *Deleted

## 2015-09-14 ENCOUNTER — Ambulatory Visit: Payer: Medicare Other | Admitting: Cardiology

## 2015-09-14 ENCOUNTER — Ambulatory Visit: Payer: Medicare Other | Admitting: Family Medicine

## 2015-09-14 ENCOUNTER — Inpatient Hospital Stay (HOSPITAL_COMMUNITY): Payer: Medicare Other

## 2015-09-14 DIAGNOSIS — R0789 Other chest pain: Secondary | ICD-10-CM

## 2015-09-14 DIAGNOSIS — I350 Nonrheumatic aortic (valve) stenosis: Secondary | ICD-10-CM

## 2015-09-14 HISTORY — PX: TEE WITHOUT CARDIOVERSION: SHX5443

## 2015-09-14 HISTORY — PX: CARDIOVERSION: SHX1299

## 2015-09-14 LAB — GLUCOSE, CAPILLARY
GLUCOSE-CAPILLARY: 155 mg/dL — AB (ref 65–99)
GLUCOSE-CAPILLARY: 209 mg/dL — AB (ref 65–99)
GLUCOSE-CAPILLARY: 253 mg/dL — AB (ref 65–99)
Glucose-Capillary: 202 mg/dL — ABNORMAL HIGH (ref 65–99)
Glucose-Capillary: 286 mg/dL — ABNORMAL HIGH (ref 65–99)

## 2015-09-14 LAB — PROTIME-INR
INR: 2.84 — ABNORMAL HIGH (ref 0.00–1.49)
Prothrombin Time: 29.4 seconds — ABNORMAL HIGH (ref 11.6–15.2)

## 2015-09-14 SURGERY — ECHOCARDIOGRAM, TRANSESOPHAGEAL
Anesthesia: Monitor Anesthesia Care

## 2015-09-14 MED ORDER — EPHEDRINE SULFATE 50 MG/ML IJ SOLN
INTRAMUSCULAR | Status: DC | PRN
  Administered 2015-09-14: 10 mg via INTRAVENOUS

## 2015-09-14 MED ORDER — PROPOFOL 500 MG/50ML IV EMUL
INTRAVENOUS | Status: DC | PRN
  Administered 2015-09-14: 50 ug/kg/min via INTRAVENOUS

## 2015-09-14 MED ORDER — LACTATED RINGERS IV SOLN
INTRAVENOUS | Status: DC | PRN
  Administered 2015-09-14: 10:00:00 via INTRAVENOUS

## 2015-09-14 MED ORDER — WARFARIN SODIUM 3 MG PO TABS
3.0000 mg | ORAL_TABLET | Freq: Once | ORAL | Status: AC
  Administered 2015-09-14: 3 mg via ORAL
  Filled 2015-09-14: qty 1

## 2015-09-14 MED ORDER — PROPOFOL 10 MG/ML IV BOLUS
INTRAVENOUS | Status: DC | PRN
  Administered 2015-09-14: 10 mg via INTRAVENOUS
  Administered 2015-09-14: 20 mg via INTRAVENOUS
  Administered 2015-09-14: 10 mg via INTRAVENOUS

## 2015-09-14 MED ORDER — SODIUM CHLORIDE 0.9 % IV SOLN
INTRAVENOUS | Status: DC
  Administered 2015-09-14: 08:00:00 via INTRAVENOUS

## 2015-09-14 NOTE — Progress Notes (Addendum)
Patient ID: Curtis Frazier, male   DOB: [birth year], 75 y.o.   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 Patient also has history of lumbar incision status post laminectomy earlier in February 2016. He was just discharged 3/27 after being in hospital for chest pain evaluation. Cardiology was consulted at that time and their recommendation was to continue medical management because patient's chest pain resolved.  He presented to [facility] with reports of epigastric and chest pain that radiates to the back, 10 out of 10 in intensity and with associated shortness of breath especially after ambulation.  On admission, he was hemodynamically stable. Initial thought was that he may have gallbladder related pain. HIDA scan was done and showed no acute cholecystitis. Liver enzymes are all within normal limits.  During this hospital stay, patient was seen by cardiology because of atrial fibrillation with RVR. Increasing amiodarone and adding Cardizem did not result in significant HR control.   Assessment/Plan:    Principal Problem: Chest pain with exertion / mild troponin elevation - Minimal elevation in troponin on admission, but now WNL - 2-D echo on this admission with ejection fraction of [id number], grade 1 diastolic dysfunction, normal wall motion - No chest pain this morning  Active Problems: Atrial fibrillation, chronic; with acute uncontrolled episode and RVR / Typical atrial flutter  - Chads Vasc score 5 - Continue coumadin for [facility] - Patient was on amiodarone and the dose was changed from 200 mg daily to 400 mg daily. In addition cardizem added and all that medical adjustment did not result in significant heart rate control -  Plan for TEE cardioversion today   Acute on chronic diastolic congestive heart failure - Appreciate cardiology following - 2-D echo on this admission showed ejection fraction of 55% with grade 1 diastolic dysfunction - Per cardiology, Lasix started 20 mg IV every 12 hours - Continue daily weight and strict intake and output, for some reason last weight obtained 09/08/2015  Lobar pneumonia, unspecified organism  - Was on Levaquin, started 3/30 --> 09/12/2015 - Respiratory status stable - No fever and no elevated white blood cell count  Symptomatic cholelithiasis - HIDA scan normal - LFTs WNL, lipase WNL - Tolerating regular diet  - Since no acute cholecystitis surgery has signed off   History of laminectomy - Appreciate wound care assessment. - Wound measurement: 1cm x 0.3cm x 0.5cm  - Per wound care evaluation, wound has not yet healed at this point. The patient is past 2 week follow-up with neurosurgery. - Outpt follow up after discharge will be needed   Rheumatoid arthritis ([place]) / PMR (polymyalgia rheumatica) (HCC) - Continue prednisone per home dose   Anemia of chronic disease  - Related to history of rheumatoid arthritis and polymyalgia rheumatica as well as chronic kidney disease  - Hemoglobin stable at 10.1 - No reports of bleeding - Check CBC in the morning  Chronic kidney disease stage III - Recent baseline creatinine 1.8  - Creatinine within baseline range  DVT Prophylaxis  - Lovenox subQ ordered  Code Status: Full.  Family Communication:  plan of care discussed with the patient; spoke with his Son at bedside Disposition Plan: Plan for TEE cardioversion today  IV access:  Peripheral IV  Procedures and diagnostic studies:    Nm Hepatobiliary Liver Func 09/08/2015   No evidence of cystic duct or common bile duct obstruction. Electronically Signed   By: Rolm Baptise M.D.   On: 09/08/2015 09:24   Medical Consultants:  Surgery Cardiology  Other Consultants:   None  IAnti-Infectives:   Levaquin 09/09/2015 --> 09/12/2015   Leisa Lenz, MD  [facility] Pager [phone]  Time spent in minutes: 25 minutes  If 7PM-7AM, please contact night-coverage [email or web address] Password [email or web address] 09/14/2015, 11:15 AM   LOS: 7 days    HPI/Subjective: No acute overnight events. No reports of chest pain this morning.  Objective: Filed Vitals:   09/14/15 0435 09/14/15 0955 09/14/15 1054 09/14/15 1100  BP: 115/81 111/61 91/53 101/70  Pulse:  107 94 98  Temp: 97.8 F (36.6 C) 99.9 F (37.7 C) 97.8 F (36.6 C)   TempSrc: Oral Oral Oral   Resp: 18  18 18   Height:      Weight:      SpO2: 96% 94% 94% 96%    Intake/Output Summary (Last 24 hours) at 09/14/15 1115 Last data filed at 09/14/15 1055  Gross per 24 hour  Intake    410 ml  Output   2050 ml  Net  -1640 ml    Exam:   General:  Alert, no acute distress   Cardiovascular: Appreciate S1-S2, tachycardia  Respiratory: Bilateral air entry, no wheezing or rhonchi  Abdomen: Nontender, nondistended, appreciate bowel sounds  Extremities: Bilateral pulses, no lower extremity edema  Neuro: No focal deficits  Data Reviewed: Basic Metabolic Panel:  Recent Labs Lab 09/07/15 1826 09/08/15 0326 09/09/15 1223 09/12/15 0222 09/13/15 0248 09/13/15 1026  NA  --  138 137 141 137 139  K  --  4.6 4.5 3.7 3.6 3.2*  CL  --  114* 110 110 108 103  CO2  --  16* 16* 22 23 20*  GLUCOSE  --  145* 256* 189* 215* 199*  BUN  --  12 11 6 8 8   CREATININE  --  1.35* 1.39* 1.13 1.24 1.08  CALCIUM  --  7.1* 7.3* 7.4* 7.2* 7.7*  MG 1.4*  --  2.0  --   --   --    Liver Function Tests:  Recent Labs Lab 09/07/15 1450 09/08/15 0326  AST 16 18  ALT 17 15*  ALKPHOS 50 49  BILITOT 0.9 0.9  PROT 5.3* 4.6*  ALBUMIN 2.3* 2.1*   No results for input(s): LIPASE, AMYLASE in the last 168 hours. CBC:  Recent Labs Lab 09/07/15 1450 09/08/15 0326 09/09/15 1058 09/12/15 0222 09/13/15 0248  WBC 6.7 7.9  8.9 8.2 8.5  NEUTROABS 4.5  --   --   --   --   HGB 13.3 12.4* 10.6* 9.4* 10.1*  HCT 41.4 37.9* 32.5* 30.0* 30.4*  MCV 97.2 98.4 97.6 95.5 95.3  PLT 154 168 257 305 302   Cardiac Enzymes:  Recent Labs Lab 09/07/15 1417 09/07/15 2343 09/09/15 0426 09/09/15 1058 09/09/15 1608  TROPONINI 0.04* 0.03 0.04* 0.03 <0.03   CBG:  Recent Labs Lab 09/13/15 1114 09/13/15 1618 09/13/15 2109 09/14/15 0626 09/14/15 1000  GLUCAP 243* 252* 210* 202* 155*    Recent Results (from the past 240 hour(s))  Blood Culture (routine x 2)     Status: None   Collection Time: 09/07/15  2:40 PM  Result Value Ref Range Status   Specimen Description BLOOD RIGHT HAND  Final   Special  Requests BOTTLES DRAWN AEROBIC AND ANAEROBIC 5CC  Final   Culture NO GROWTH 5 DAYS  Final   Report Status 09/12/2015 FINAL  Final  Blood Culture (routine x 2)     Status: None   Collection Time: 09/07/15  2:50 PM  Result Value Ref Range Status   Specimen Description BLOOD LEFT ANTECUBITAL  Final   Special Requests BOTTLES DRAWN AEROBIC AND ANAEROBIC 5CC  Final   Culture NO GROWTH 5 DAYS  Final   Report Status 09/12/2015 FINAL  Final  Urine culture     Status: None   Collection Time: 09/07/15  5:20 PM  Result Value Ref Range Status   Specimen Description URINE, CLEAN CATCH  Final   Special Requests NONE  Final   Culture MULTIPLE SPECIES PRESENT, SUGGEST RECOLLECTION  Final   Report Status 09/09/2015 FINAL  Final     Scheduled Meds: . [MAR Hold] amiodarone  400 mg Oral BID  . [MAR Hold] collagenase   Topical Daily  . [MAR Hold] diltiazem  240 mg Oral Daily  . [MAR Hold] furosemide  20 mg Intravenous Q12H  . [MAR Hold] insulin aspart  0-9 Units Subcutaneous TID WC  . [MAR Hold] nitroGLYCERIN  0.4 mg Sublingual Once  . [MAR Hold] pantoprazole  40 mg Oral Daily  . [MAR Hold] predniSONE  10 mg Oral Q breakfast  . [MAR Hold] sodium chloride flush  3 mL Intravenous Q12H  . [MAR Hold] thiamine  100 mg Oral Daily  .  [MAR Hold] warfarin  3 mg Oral ONCE-1800  . [MAR Hold] Warfarin - Pharmacist Dosing Inpatient   Does not apply q1800   Continuous Infusions: . sodium chloride 10 mL/hr at 09/11/15 2232  . sodium chloride 20 mL/hr at 09/14/15 0759

## 2015-09-14 NOTE — Progress Notes (Signed)
Patient ID: Curtis Frazier, male   DOB: 1941/01/14, 75 y.o.   MRN: [id number] Patient ID: Curtis Frazier, male   DOB: 1940-12-07, 75 y.o.   MRN: [id number]    SUBJECTIVE:  No longer having chest pain.  Not SOB.  No palpitations    PHYSICAL EXAM Filed Vitals:   09/13/15 1417 09/13/15 2109 09/14/15 0140 09/14/15 0435  BP: 111/59 111/62 112/66 115/81  Pulse: 118     Temp: 99.1 F (37.3 C) 99.1 F (37.3 C)  97.8 F (36.6 C)  TempSrc: Oral Oral  Oral  Resp: 19  19 18   Height:      Weight:      SpO2: 98% 97% 94% 96%   General:  No distress Lungs:  Clear Heart:  Irregular  AS murmur  Abdomen:  Positive bowel sounds, no rebound no guarding Extremities:  No edema   LABS: Lab Results  Component Value Date   TROPONINI <0.03 09/09/2015   Results for orders placed or performed during the hospital encounter of 09/07/15 (from the past 24 hour(s))  Basic metabolic panel     Status: Abnormal   Collection Time: 09/13/15 10:26 AM  Result Value Ref Range   Sodium 139 135 - 145 mmol/L   Potassium 3.2 (L) 3.5 - 5.1 mmol/L   Chloride 103 101 - 111 mmol/L   CO2 20 (L) 22 - 32 mmol/L   Glucose, Bld 199 (H) 65 - 99 mg/dL   BUN 8 6 - 20 mg/dL   Creatinine, Ser 1.08 0.61 - 1.24 mg/dL   Calcium 7.7 (L) 8.9 - 10.3 mg/dL   GFR calc non Af Amer >60 >60 mL/min   GFR calc Af Amer >60 >60 mL/min   Anion gap 16 (H) 5 - 15  Glucose, capillary     Status: Abnormal   Collection Time: 09/13/15 11:14 AM  Result Value Ref Range   Glucose-Capillary 243 (H) 65 - 99 mg/dL   Comment 1 Notify RN    Comment 2 Document in Chart   Glucose, capillary     Status: Abnormal   Collection Time: 09/13/15  4:18 PM  Result Value Ref Range   Glucose-Capillary 252 (H) 65 - 99 mg/dL   Comment 1 Notify RN    Comment 2 Document in Chart   Glucose, capillary     Status: Abnormal   Collection Time: 09/13/15  9:09 PM  Result Value Ref Range   Glucose-Capillary 210 (H) 65 - 99 mg/dL  Protime-INR     Status: Abnormal   Collection Time: 09/14/15  2:52 AM  Result Value Ref Range   Prothrombin Time 29.4 (H) 11.6 - 15.2 seconds   INR 2.84 (H) 0.00 - 1.49  Glucose, capillary     Status: Abnormal   Collection Time: 09/14/15  6:26 AM  Result Value Ref Range   Glucose-Capillary 202 (H) 65 - 99 mg/dL    Intake/Output Summary (Last 24 hours) at 09/14/15 0759 Last data filed at 09/14/15 0145  Gross per 24 hour  Intake    360 ml  Output   2050 ml  Net  -1690 ml    Current facility-administered medications:  .  0.9 %  sodium chloride infusion, , Intravenous, Continuous, Robbie Lis, MD, Last Rate: 10 mL/hr at 09/11/15 2232 .  0.9 %  sodium chloride infusion, , Intravenous, Continuous, Josue Hector, MD, Last Rate: 20 mL/hr at 09/14/15 0759 .  acetaminophen (TYLENOL) tablet 650 mg, 650 mg, Oral, Q6H PRN, 650  mg at 09/12/15 2249 **OR** acetaminophen (TYLENOL) suppository 650 mg, 650 mg, Rectal, Q6H PRN, Samella Parr, NP .  amiodarone (PACERONE) tablet 400 mg, 400 mg, Oral, BID, Minus Breeding, MD, 400 mg at 09/13/15 2107 .  collagenase (SANTYL) ointment, , Topical, Daily, Robbie Lis, MD .  diltiazem (CARDIZEM CD) 24 hr capsule 240 mg, 240 mg, Oral, Daily, Troy Sine, MD, 240 mg at 09/13/15 0958 .  furosemide (LASIX) injection 20 mg, 20 mg, Intravenous, Q12H, Barton Dubois, MD, 20 mg at 09/14/15 0759 .  insulin aspart (novoLOG) injection 0-9 Units, 0-9 Units, Subcutaneous, TID WC, Robbie Lis, MD, 3 Units at 09/14/15 [phone] .  nitroGLYCERIN (NITROSTAT) SL tablet 0.4 mg, 0.4 mg, Sublingual, Once, Jeryl Columbia, NP, 0.4 mg at 09/09/15 0433 .  ondansetron (ZOFRAN) tablet 4 mg, 4 mg, Oral, Q6H PRN **OR** ondansetron (ZOFRAN) injection 4 mg, 4 mg, Intravenous, Q6H PRN, Samella Parr, NP .  pantoprazole (PROTONIX) EC tablet 40 mg, 40 mg, Oral, Daily, Samella Parr, NP, 40 mg at 09/13/15 0958 .  predniSONE (DELTASONE) tablet 10 mg, 10 mg, Oral, Q breakfast, Robbie Lis, MD, 10 mg at 09/14/15 0756 .   sodium chloride flush (NS) 0.9 % injection 3 mL, 3 mL, Intravenous, Q12H, Samella Parr, NP, 3 mL at 09/13/15 2200 .  thiamine (VITAMIN B-1) tablet 100 mg, 100 mg, Oral, Daily, Samella Parr, NP, 100 mg at 09/13/15 0958 .  Warfarin - Pharmacist Dosing Inpatient, , Does not apply, q1800, Karren Cobble, RPH, Stopped at 09/13/15 1800  ASSESSMENT AND PLAN:  Chest pain:  Not felt to be cardiac.  No work up indicated.     Atrial flutter:  On amiodarone   TEE/DCCV. Cannot be done today No anesthesia coverage.  Have scheduled for Tuesday 11:00 with Dr Marlou Porch Orders written will resume diet today  Dyspnea:  He says that he is more SOB. BNP 1800 good diuresis  Will benefit from AV synchrony post [facility] will givea am lasix with condom catheter  Echo with EF 55-60% but moderate AS  Moderate stenosis mean gradient 22 mmHg can look at AV more with TEE.  Deep transgastric views for doppler   TEE/DCC latter today with Dr Marlou Porch  INR 2.84  Jenkins Rouge 09/14/2015 7:59 AM

## 2015-09-14 NOTE — Progress Notes (Signed)
ANTICOAGULATION CONSULT NOTE - Follow Up Consult  Pharmacy Consult for Coumadin Indication: atrial fibrillation  Allergies  Allergen Reactions  . Ace Inhibitors Other (See Comments)    Causes high K  . Angiotensin Receptor Blockers Other (See Comments)    Causes high K  . Metoprolol Other (See Comments)    May cause elevated K level  . Penicillins Swelling and Rash    Has patient had a PCN reaction causing immediate rash, facial/tongue/throat swelling, SOB or lightheadedness with hypotension: YES Has patient had a PCN reaction causing severe rash involving mucus membranes or skin necrosis: NO Has patient had a PCN reaction that required hospitalization NO Has patient had a PCN reaction occurring within the last 10 years: NO If all of the above answers are "NO", then may proceed with Cephalosporin use.  . Tetracycline Swelling and Rash    Patient Measurements: Height: 5\' 8"  (172.7 cm) Weight: 180 lb 8.9 oz (81.9 kg) IBW/kg (Calculated) : 68.4   Vital Signs: Temp: 97.8 F (36.6 C) (04/04 0435) Temp Source: Oral (04/04 0435) BP: 115/81 mmHg (04/04 0435)  Labs:  Recent Labs  09/12/15 0222 09/13/15 0248 09/13/15 1026 09/14/15 0252  HGB 9.4* 10.1*  --   --   HCT 30.0* 30.4*  --   --   PLT 305 302  --   --   LABPROT 28.6* 33.1*  --  29.4*  INR 2.74* 3.33*  --  2.84*  CREATININE 1.13 1.24 1.08  --     Estimated Creatinine Clearance: 56.3 mL/min (by C-G formula based on Cr of 1.08).   Assessment: 69 yom with history of aFib on warfarin PTA. Pharmacy consulted to dose warfarin, held 3/28. Surgery note says no lap chole required this admission, restarted warfarin on 3/29. INR= 2.94 now with trend down Plans noted for DCCV today.  -She is noted on an increased dose of amiodarone. Per chart history it appears she is typically taking coumadin 5mg /d  -- PTA warf: 10 mg x2 days then 5 mg daily (coumadin was started 08/03/15).   Goal of Therapy:  INR 2-3 Monitor platelets  by anticoagulation protocol: Yes   Plan:  Coumadin 3mg  po today Daily INR  Hildred Laser, Pharm D 09/14/2015 8:20 AM

## 2015-09-14 NOTE — Progress Notes (Signed)
Inpatient Diabetes Program Recommendations  AACE/ADA: New Consensus Statement on Inpatient Glycemic Control (2015)  Target Ranges:  Prepandial:   less than 140 mg/dL      Peak postprandial:   less than 180 mg/dL (1-2 hours)      Critically ill patients:  140 - 180 mg/dL   Review of Glycemic Control  Inpatient Diabetes Program Recommendations:  Insulin - Basal: add Lantus 10 units  Thank you  Raoul Pitch BSN, RN,CDE Inpatient Diabetes Coordinator [phone] (team pager)

## 2015-09-14 NOTE — Interval H&P Note (Signed)
History and Physical Interval Note:  09/14/2015 9:45 AM  Curtis Frazier  has presented today for surgery, with the diagnosis of a fib  The various methods of treatment have been discussed with the patient and family. After consideration of risks, benefits and other options for treatment, the patient has consented to  Procedure(s): TRANSESOPHAGEAL ECHOCARDIOGRAM (TEE) (N/A) CARDIOVERSION (N/A) as a surgical intervention .  The patient's history has been reviewed, patient examined, no change in status, stable for surgery.  I have reviewed the patient's chart and labs.  Questions were answered to the patient's satisfaction.     SKAINS, MARK

## 2015-09-14 NOTE — Transfer of Care (Signed)
Immediate Anesthesia Transfer of Care Note  Patient: Curtis Frazier  Procedure(s) Performed: Procedure(s): TRANSESOPHAGEAL ECHOCARDIOGRAM (TEE) (N/A) CARDIOVERSION (N/A)  Patient Location: PACU and Endoscopy Unit  Anesthesia Type:MAC  Level of Consciousness: awake, alert , oriented and sedated  Airway & Oxygen Therapy: Patient Spontanous Breathing and Patient connected to nasal cannula oxygen  Post-op Assessment: Report given to RN, Post -op Vital signs reviewed and stable and Patient moving all extremities  Post vital signs: Reviewed and stable  Last Vitals:  Filed Vitals:   09/14/15 0955 09/14/15 1054  BP: 111/61   Pulse: 107 94  Temp: 37.7 C 36.6 C  Resp:  18    Complications: No apparent anesthesia complications

## 2015-09-14 NOTE — Progress Notes (Signed)
  Echocardiogram Echocardiogram Transesophageal has been performed.  Curtis Frazier 09/14/2015, 10:48 AM

## 2015-09-14 NOTE — Clinical Documentation Improvement (Signed)
Cardiology Internal Medicine  Can the diagnosis of Atrial Flutter be further specified?  Thank you   Atypical (Type II)  Typical (Type I)  Other  Clinically Undetermined  Document any associated diagnoses/conditions.   Supporting Information: Afib/ Flutter/ Acute on Chronic Diastolic hf/ Demand Ischemia  Treatment: amiodarone/ IV Lopressor  TEE/DCCV   Please exercise your independent, professional judgment when responding. A specific answer is not anticipated or expected.   Thank You,  Baker [phone]

## 2015-09-14 NOTE — CV Procedure (Signed)
    Electrical Cardioversion Procedure Note Curtis Frazier [id number] 1941-05-08  Procedure: Electrical Cardioversion Indications:  Atrial Flutter  Time Out: Verified patient identification, verified procedure,medications/allergies/relevent history reviewed, required imaging and test results available.  Performed  Procedure Details  The patient was NPO after midnight. Anesthesia was administered at the beside  by Dr. Kalman Shan with propofol.  Cardioversion was performed with synchronized biphasic defibrillation via AP pads with 120 joules.  1 attempt(s) were performed.  The patient converted to normal sinus rhythm. The patient tolerated the procedure well   IMPRESSION:  Successful cardioversion of atrial fibrillation to NSR.  TEE with moderate AS, normal EF. Moderate TR.     Curtis Frazier, Curtis Frazier 09/14/2015, 11:18 AM

## 2015-09-14 NOTE — Progress Notes (Signed)
Utilization review completed.  

## 2015-09-14 NOTE — H&P (View-Only) (Signed)
Patient ID: Curtis Frazier, male   DOB: Oct 07, 1940, 75 y.o.   MRN: [id number] Patient ID: Curtis Frazier, male   DOB: 1941/05/09, 75 y.o.   MRN: [id number]    SUBJECTIVE:  No longer having chest pain.  Not SOB.  No palpitations    PHYSICAL EXAM Filed Vitals:   09/13/15 1417 09/13/15 2109 09/14/15 0140 09/14/15 0435  BP: 111/59 111/62 112/66 115/81  Pulse: 118     Temp: 99.1 F (37.3 C) 99.1 F (37.3 C)  97.8 F (36.6 C)  TempSrc: Oral Oral  Oral  Resp: 19  19 18   Height:      Weight:      SpO2: 98% 97% 94% 96%   General:  No distress Lungs:  Clear Heart:  Irregular  AS murmur  Abdomen:  Positive bowel sounds, no rebound no guarding Extremities:  No edema   LABS: Lab Results  Component Value Date   TROPONINI <0.03 09/09/2015   Results for orders placed or performed during the hospital encounter of 09/07/15 (from the past 24 hour(s))  Basic metabolic panel     Status: Abnormal   Collection Time: 09/13/15 10:26 AM  Result Value Ref Range   Sodium 139 135 - 145 mmol/L   Potassium 3.2 (L) 3.5 - 5.1 mmol/L   Chloride 103 101 - 111 mmol/L   CO2 20 (L) 22 - 32 mmol/L   Glucose, Bld 199 (H) 65 - 99 mg/dL   BUN 8 6 - 20 mg/dL   Creatinine, Ser 1.08 0.61 - 1.24 mg/dL   Calcium 7.7 (L) 8.9 - 10.3 mg/dL   GFR calc non Af Amer >60 >60 mL/min   GFR calc Af Amer >60 >60 mL/min   Anion gap 16 (H) 5 - 15  Glucose, capillary     Status: Abnormal   Collection Time: 09/13/15 11:14 AM  Result Value Ref Range   Glucose-Capillary 243 (H) 65 - 99 mg/dL   Comment 1 Notify RN    Comment 2 Document in Chart   Glucose, capillary     Status: Abnormal   Collection Time: 09/13/15  4:18 PM  Result Value Ref Range   Glucose-Capillary 252 (H) 65 - 99 mg/dL   Comment 1 Notify RN    Comment 2 Document in Chart   Glucose, capillary     Status: Abnormal   Collection Time: 09/13/15  9:09 PM  Result Value Ref Range   Glucose-Capillary 210 (H) 65 - 99 mg/dL  Protime-INR     Status: Abnormal   Collection Time: 09/14/15  2:52 AM  Result Value Ref Range   Prothrombin Time 29.4 (H) 11.6 - 15.2 seconds   INR 2.84 (H) 0.00 - 1.49  Glucose, capillary     Status: Abnormal   Collection Time: 09/14/15  6:26 AM  Result Value Ref Range   Glucose-Capillary 202 (H) 65 - 99 mg/dL    Intake/Output Summary (Last 24 hours) at 09/14/15 0759 Last data filed at 09/14/15 0145  Gross per 24 hour  Intake    360 ml  Output   2050 ml  Net  -1690 ml    Current facility-administered medications:  .  0.9 %  sodium chloride infusion, , Intravenous, Continuous, Robbie Lis, MD, Last Rate: 10 mL/hr at 09/11/15 2232 .  0.9 %  sodium chloride infusion, , Intravenous, Continuous, Josue Hector, MD, Last Rate: 20 mL/hr at 09/14/15 0759 .  acetaminophen (TYLENOL) tablet 650 mg, 650 mg, Oral, Q6H PRN, 650  mg at 09/12/15 2249 **OR** acetaminophen (TYLENOL) suppository 650 mg, 650 mg, Rectal, Q6H PRN, Samella Parr, NP .  amiodarone (PACERONE) tablet 400 mg, 400 mg, Oral, BID, Minus Breeding, MD, 400 mg at 09/13/15 2107 .  collagenase (SANTYL) ointment, , Topical, Daily, Robbie Lis, MD .  diltiazem (CARDIZEM CD) 24 hr capsule 240 mg, 240 mg, Oral, Daily, Troy Sine, MD, 240 mg at 09/13/15 0958 .  furosemide (LASIX) injection 20 mg, 20 mg, Intravenous, Q12H, Barton Dubois, MD, 20 mg at 09/14/15 0759 .  insulin aspart (novoLOG) injection 0-9 Units, 0-9 Units, Subcutaneous, TID WC, Robbie Lis, MD, 3 Units at 09/14/15 [phone] .  nitroGLYCERIN (NITROSTAT) SL tablet 0.4 mg, 0.4 mg, Sublingual, Once, Jeryl Columbia, NP, 0.4 mg at 09/09/15 0433 .  ondansetron (ZOFRAN) tablet 4 mg, 4 mg, Oral, Q6H PRN **OR** ondansetron (ZOFRAN) injection 4 mg, 4 mg, Intravenous, Q6H PRN, Samella Parr, NP .  pantoprazole (PROTONIX) EC tablet 40 mg, 40 mg, Oral, Daily, Samella Parr, NP, 40 mg at 09/13/15 0958 .  predniSONE (DELTASONE) tablet 10 mg, 10 mg, Oral, Q breakfast, Robbie Lis, MD, 10 mg at 09/14/15 0756 .   sodium chloride flush (NS) 0.9 % injection 3 mL, 3 mL, Intravenous, Q12H, Samella Parr, NP, 3 mL at 09/13/15 2200 .  thiamine (VITAMIN B-1) tablet 100 mg, 100 mg, Oral, Daily, Samella Parr, NP, 100 mg at 09/13/15 0958 .  Warfarin - Pharmacist Dosing Inpatient, , Does not apply, q1800, Karren Cobble, RPH, Stopped at 09/13/15 1800  ASSESSMENT AND PLAN:  Chest pain:  Not felt to be cardiac.  No work up indicated.     Atrial flutter:  On amiodarone   TEE/DCCV. Cannot be done today No anesthesia coverage.  Have scheduled for Tuesday 11:00 with Dr Marlou Porch Orders written will resume diet today  Dyspnea:  He says that he is more SOB. BNP 1800 good diuresis  Will benefit from AV synchrony post [facility] will givea am lasix with condom catheter  Echo with EF 55-60% but moderate AS  Moderate stenosis mean gradient 22 mmHg can look at AV more with TEE.  Deep transgastric views for doppler   TEE/DCC latter today with Dr Marlou Porch  INR 2.84  Jenkins Rouge 09/14/2015 7:59 AM

## 2015-09-14 NOTE — Anesthesia Postprocedure Evaluation (Signed)
Anesthesia Post Note  Patient: Curtis Frazier  Procedure(s) Performed: Procedure(s) (LRB): TRANSESOPHAGEAL ECHOCARDIOGRAM (TEE) (N/A) CARDIOVERSION (N/A)  Patient location during evaluation: PACU Anesthesia Type: MAC Level of consciousness: awake and alert Pain management: pain level controlled Vital Signs Assessment: post-procedure vital signs reviewed and stable Respiratory status: spontaneous breathing, nonlabored ventilation, respiratory function stable and patient connected to nasal cannula oxygen Cardiovascular status: blood pressure returned to baseline and stable Postop Assessment: no signs of nausea or vomiting Anesthetic complications: no    Last Vitals:  Filed Vitals:   09/14/15 1054 09/14/15 1100  BP: 91/53 101/70  Pulse: 94 98  Temp: 36.6 C   Resp: 18 18    Last Pain:  Filed Vitals:   09/14/15 1101  PainSc: 0-No pain                 Aliviya Schoeller S

## 2015-09-14 NOTE — Evaluation (Signed)
Physical Therapy Evaluation Patient Details Name: Curtis Frazier MRN: [id number] DOB: 11/10/1940 Today's Date: 09/14/2015   History of Present Illness  Curtis Frazier is a 75 y.o. male with a Past Medical History ofCAD s/p Cath w/ angioplasty, DM, MI, CHF, PAF, GI bleeds - AVMs, HLD, HTN, PRM, Psoriasis, who presents with symptomatitic cholelithiasis. No direct evidence of acute cholecystitis. Stress dose steroids for hypotension due to adrenal insufficiency.  Developed chest pain, was in a-flutter w/RVR and underwent cardioversion earlier today and back to sinus rhythm.   Clinical Impression  Patient presents with decreased independence and safety with mobility due to deficits listed in PT problem list below.  He will benefit from skilled PT in the acute setting to allow return home with family support to resume HHPT.    Follow Up Recommendations Home health PT;Supervision for mobility/OOB    Equipment Recommendations  None recommended by PT    Recommendations for Other Services       Precautions / Restrictions Precautions Precautions: Back;Fall Precaution Comments: Recent spine surgery 07/15/15      Mobility  Bed Mobility   Bed Mobility: Supine to Sit     Supine to sit: HOB elevated;Supervision   Sit to sidelying: HOB elevated;Supervision General bed mobility comments: cues and use of rail  Transfers   Equipment used: Rolling walker (2 wheeled) Transfers: Sit to/from Stand Sit to Stand: Min assist         General transfer comment: assisted more than needed due to pt anxious  Ambulation/Gait   Ambulation Distance (Feet): 30 Feet Assistive device: Rolling walker (2 wheeled) Gait Pattern/deviations: Step-through pattern;Decreased stride length     General Gait Details: slow pace, only to just outside door and back due to pt's fear of recurrence of arrhythmia and mild dyspnea  Stairs            Wheelchair Mobility    Modified Rankin (Stroke Patients Only)        Balance Overall balance assessment: Needs assistance   Sitting balance-Leahy Scale: Good     Standing balance support: Bilateral upper extremity supported Standing balance-Leahy Scale: Poor Standing balance comment: needs UE support mild, posterior bias                             Pertinent Vitals/Pain Pain Assessment: No/denies pain Faces Pain Scale: No hurt    Home Living Family/patient expects to be discharged to:: Private residence Living Arrangements: Spouse/significant other Available Help at Discharge: Family;Available 24 hours/day Type of Home: House Home Access: Stairs to enter Entrance Stairs-Rails: None Entrance Stairs-Number of Steps: 1 from garage Home Layout: One level Home Equipment: Environmental consultant - 2 wheels;Bedside commode;Shower seat;Hand held shower head      Prior Function Level of Independence: Independent with assistive device(s)               Hand Dominance   Dominant Hand: Right    Extremity/Trunk Assessment               Lower Extremity Assessment: RLE deficits/detail;LLE deficits/detail RLE Deficits / Details: AROM WFL, strength hip flexion 4-/5, knee extension 4/5 ankle DF 3+/5 LLE Deficits / Details: AROM WFL, strength hip flexion 4/5, knee extension 4/5, ankle DF 4-/5  Cervical / Trunk Assessment: Kyphotic  Communication   Communication: No difficulties  Cognition Arousal/Alertness: Awake/alert Behavior During Therapy: Anxious Overall Cognitive Status: Within Functional Limits for tasks assessed  General Comments      Exercises        Assessment/Plan    PT Assessment Patient needs continued PT services  PT Diagnosis Abnormality of gait;Generalized weakness   PT Problem List Decreased strength;Decreased activity tolerance;Decreased balance;Decreased mobility;Decreased safety awareness;Decreased knowledge of precautions  PT Treatment Interventions     PT Goals (Current goals  can be found in the Care Plan section) Acute Rehab PT Goals Patient Stated Goal: to return to independence PT Goal Formulation: With patient Time For Goal Achievement: 09/21/15 Potential to Achieve Goals: Good    Frequency Min 3X/week   Barriers to discharge        Co-evaluation               End of Session Equipment Utilized During Treatment: Gait belt Activity Tolerance: Patient limited by fatigue Patient left: in bed;with call bell/phone within reach;with bed alarm set           Time: 1445-1520 PT Time Calculation (min) (ACUTE ONLY): 35 min   Charges:   PT Evaluation $PT Eval Moderate Complexity: 1 Procedure PT Treatments $Gait Training: 8-22 mins   PT G CodesReginia Naas September 24, 2015, 4:35 PM  Magda Kiel, [place] 09/24/15

## 2015-09-14 NOTE — Anesthesia Preprocedure Evaluation (Addendum)
Anesthesia Evaluation  Patient identified by MRN, date of birth, ID band Patient awake    Reviewed: Allergy & Precautions, NPO status , Patient's Chart, lab work & pertinent test results  Airway Mallampati: II  TM Distance: >3 FB Neck ROM: Full    Dental no notable dental hx.    Pulmonary neg pulmonary ROS, former smoker,    Pulmonary exam normal breath sounds clear to auscultation       Cardiovascular hypertension, Pt. on medications and Pt. on home beta blockers + CAD and + Past MI   Rhythm:Irregular Rate:Normal  - Left ventricle: Systolic function was normal. The estimated  ejection fraction was in the range of 55% to 60%. No evidence of  thrombus. - Mitral valve: Moderately calcified annulus. There was mild  regurgitation. - Left atrium: No evidence of thrombus in the atrial cavity or  appendage. No evidence of thrombus in the atrial cavity or  appendage. - Atrial septum: There was increased thickness of the septum,  consistent with lipomatous hypertrophy. No defect or patent  foramen ovale was identified by color flow Doppler.   Neuro/Psych negative neurological ROS  negative psych ROS   GI/Hepatic negative GI ROS, Neg liver ROS,   Endo/Other  diabetes  Renal/GU negative Renal ROS  negative genitourinary   Musculoskeletal  (+) Arthritis , Rheumatoid disorders,    Abdominal   Peds negative pediatric ROS (+)  Hematology negative hematology ROS (+)   Anesthesia Other Findings   Reproductive/Obstetrics negative OB ROS                            Anesthesia Physical Anesthesia Plan  ASA: III  Anesthesia Plan: MAC   Post-op Pain Management:    Induction: Intravenous  Airway Management Planned: Nasal Cannula  Additional Equipment:   Intra-op Plan:   Post-operative Plan:   Informed Consent: I have reviewed the patients History and Physical, chart, labs and discussed  the procedure including the risks, benefits and alternatives for the proposed anesthesia with the patient or authorized representative who has indicated his/her understanding and acceptance.   Dental advisory given  Plan Discussed with: CRNA and Surgeon  Anesthesia Plan Comments:         Anesthesia Quick Evaluation

## 2015-09-15 ENCOUNTER — Encounter: Payer: Medicare Other | Attending: Physical Medicine & Rehabilitation | Admitting: Physical Medicine & Rehabilitation

## 2015-09-15 DIAGNOSIS — G9340 Encephalopathy, unspecified: Secondary | ICD-10-CM

## 2015-09-15 DIAGNOSIS — E1122 Type 2 diabetes mellitus with diabetic chronic kidney disease: Secondary | ICD-10-CM

## 2015-09-15 DIAGNOSIS — R5381 Other malaise: Secondary | ICD-10-CM

## 2015-09-15 DIAGNOSIS — I5032 Chronic diastolic (congestive) heart failure: Secondary | ICD-10-CM

## 2015-09-15 DIAGNOSIS — N183 Chronic kidney disease, stage 3 (moderate): Secondary | ICD-10-CM

## 2015-09-15 DIAGNOSIS — I480 Paroxysmal atrial fibrillation: Secondary | ICD-10-CM | POA: Diagnosis not present

## 2015-09-15 LAB — PROTIME-INR
INR: 2.75 — ABNORMAL HIGH (ref 0.00–1.49)
Prothrombin Time: 28.7 s — ABNORMAL HIGH (ref 11.6–15.2)

## 2015-09-15 LAB — BASIC METABOLIC PANEL WITH GFR
Anion gap: 14 (ref 5–15)
BUN: 15 mg/dL (ref 6–20)
CO2: 27 mmol/L (ref 22–32)
Calcium: 7.7 mg/dL — ABNORMAL LOW (ref 8.9–10.3)
Chloride: 96 mmol/L — ABNORMAL LOW (ref 101–111)
Creatinine, Ser: 1.33 mg/dL — ABNORMAL HIGH (ref 0.61–1.24)
GFR calc Af Amer: 58 mL/min — ABNORMAL LOW
GFR calc non Af Amer: 50 mL/min — ABNORMAL LOW
Glucose, Bld: 297 mg/dL — ABNORMAL HIGH (ref 65–99)
Potassium: 2.9 mmol/L — ABNORMAL LOW (ref 3.5–5.1)
Sodium: 137 mmol/L (ref 135–145)

## 2015-09-15 LAB — CBC
HCT: 31.8 % — ABNORMAL LOW (ref 39.0–52.0)
Hemoglobin: 10.6 g/dL — ABNORMAL LOW (ref 13.0–17.0)
MCH: 31.3 pg (ref 26.0–34.0)
MCHC: 33.3 g/dL (ref 30.0–36.0)
MCV: 93.8 fL (ref 78.0–100.0)
Platelets: 307 K/uL (ref 150–400)
RBC: 3.39 MIL/uL — ABNORMAL LOW (ref 4.22–5.81)
RDW: 15.6 % — ABNORMAL HIGH (ref 11.5–15.5)
WBC: 7.5 K/uL (ref 4.0–10.5)

## 2015-09-15 LAB — GLUCOSE, CAPILLARY
GLUCOSE-CAPILLARY: 261 mg/dL — AB (ref 65–99)
GLUCOSE-CAPILLARY: 319 mg/dL — AB (ref 65–99)
GLUCOSE-CAPILLARY: 315 mg/dL — AB (ref 65–99)
GLUCOSE-CAPILLARY: 278 mg/dL — AB (ref 65–99)

## 2015-09-15 MED ORDER — POTASSIUM CHLORIDE CRYS ER 20 MEQ PO TBCR: 40.0000 meq | EXTENDED_RELEASE_TABLET | Freq: Once | ORAL | Status: DC

## 2015-09-15 MED ORDER — GLIPIZIDE 5 MG PO TABS
5.0000 mg | ORAL_TABLET | Freq: Every day | ORAL | Status: DC
  Administered 2015-09-16: 5 mg via ORAL
  Filled 2015-09-15: qty 1

## 2015-09-15 MED ORDER — WARFARIN SODIUM 3 MG PO TABS
3.0000 mg | ORAL_TABLET | Freq: Once | ORAL | Status: AC
  Administered 2015-09-15: 3 mg via ORAL
  Filled 2015-09-15: qty 1

## 2015-09-15 MED ORDER — POTASSIUM CHLORIDE CRYS ER 20 MEQ PO TBCR
40.0000 meq | EXTENDED_RELEASE_TABLET | Freq: Once | ORAL | Status: AC
Start: 2015-09-15 — End: 2015-09-15
  Administered 2015-09-15: 40 meq via ORAL
  Filled 2015-09-15: qty 2

## 2015-09-15 MED ORDER — AMIODARONE HCL 200 MG PO TABS
200.0000 mg | ORAL_TABLET | Freq: Two times a day (BID) | ORAL | Status: DC
  Administered 2015-09-15 – 2015-09-16 (×2): 200 mg via ORAL
  Filled 2015-09-15 (×2): qty 1

## 2015-09-15 MED ORDER — SENNOSIDES-DOCUSATE SODIUM 8.6-50 MG PO TABS
2.0000 | ORAL_TABLET | Freq: Two times a day (BID) | ORAL | Status: DC
  Administered 2015-09-15 – 2015-09-16 (×3): 2 via ORAL
  Filled 2015-09-15 (×3): qty 2

## 2015-09-15 NOTE — Progress Notes (Signed)
ANTICOAGULATION CONSULT NOTE - Follow Up Consult  Pharmacy Consult for Coumadin Indication: atrial fibrillation  Allergies  Allergen Reactions  . Ace Inhibitors Other (See Comments)    Causes high K  . Angiotensin Receptor Blockers Other (See Comments)    Causes high K  . Metoprolol Other (See Comments)    May cause elevated K level  . Penicillins Swelling and Rash    Has patient had a PCN reaction causing immediate rash, facial/tongue/throat swelling, SOB or lightheadedness with hypotension: YES Has patient had a PCN reaction causing severe rash involving mucus membranes or skin necrosis: NO Has patient had a PCN reaction that required hospitalization NO Has patient had a PCN reaction occurring within the last 10 years: NO If all of the above answers are "NO", then may proceed with Cephalosporin use.  . Tetracycline Swelling and Rash    Patient Measurements: Height: 5\' 8"  (172.7 cm) Weight: 180 lb 8.9 oz (81.9 kg) IBW/kg (Calculated) : 68.4   Vital Signs: Temp: 97.8 F (36.6 C) (04/05 0614) Temp Source: Oral (04/05 0614) BP: 117/57 mmHg (04/05 0614) Pulse Rate: 82 (04/05 0614)  Labs:  Recent Labs  09/13/15 0248 09/13/15 1026 09/14/15 0252 09/15/15 0410  HGB 10.1*  --   --  10.6*  HCT 30.4*  --   --  31.8*  PLT 302  --   --  307  LABPROT 33.1*  --  29.4* 28.7*  INR 3.33*  --  2.84* 2.75*  CREATININE 1.24 1.08  --  1.33*    Estimated Creatinine Clearance: 45.7 mL/min (by C-G formula based on Cr of 1.33).   Assessment: 62 yom with history of aFib on warfarin PTA. Pharmacy consulted to dose warfarin, held 3/28. Surgery note says no lap chole required this admission, restarted warfarin on 3/29. INR= 2.75 now with trend down. She is noted on an increased dose of amiodarone. She is also s/p successful DCCV on 4/4.  - PTA warf: 10 mg x2 days then 5 mg daily (coumadin was started 08/03/15).  Per chart history it appears she is typically taking coumadin 5mg /d  Goal of  Therapy:  INR 2-3 Monitor platelets by anticoagulation protocol: Yes   Plan:  Coumadin 3mg  po today Daily INR  Hildred Laser, Pharm D 09/15/2015 8:26 AM

## 2015-09-15 NOTE — Progress Notes (Signed)
Patient Name: Curtis Frazier Date of Encounter: 09/15/2015  Principal Problem:   Chest pain on exertion Active Problems:   Coronary atherosclerosis   Rheumatoid arthritis (HCC)   PMR (polymyalgia rheumatica) (HCC)   BPH (benign prostatic hyperplasia)   Chronic diastolic congestive heart failure (HCC)   Symptomatic cholelithiasis   Elevated troponin   Kidney disease, chronic, stage III (GFR 30-59 ml/min)   Anemia of chronic disease   Acute posthemorrhagic anemia   Abnormal thyroid function test   Atrial flutter (HCC)   Pain in the chest   Acute on chronic diastolic HF (heart failure) (HCC)   Atrial fibrillation with RVR [facility])   Primary Cardiologist: Dr. Johnsie Cancel  Patient Profile: Curtis Frazier is a 75 year old with a past medical history of CAD, DM, CHF, PAF, GI bleed, HLD, HTN.  He presented with chest pain on 09/12/15 with minimal elevation of troponin.  In Afib, TEE/DCCV on 09/14/15  SUBJECTIVE:   OBJECTIVE Filed Vitals:   09/14/15 1100 09/14/15 1420 09/14/15 2157 09/15/15 0614  BP: 101/70 103/57 116/63 117/57  Pulse: 98 106 92 82  Temp:  98.3 F (36.8 C) 98.9 F (37.2 C) 97.8 F (36.6 C)  TempSrc:  Oral Oral Oral  Resp: 18 18 20 18   Height:      Weight:      SpO2: 96% 96% 93% 93%    Intake/Output Summary (Last 24 hours) at 09/15/15 1016 Last data filed at 09/15/15 [id number]  Gross per 24 hour  Intake     50 ml  Output   1300 ml  Net  -1250 ml   Filed Weights   09/07/15 0955 09/08/15 1006  Weight: 171 lb (77.565 kg) 180 lb 8.9 oz (81.9 kg)    PHYSICAL EXAM General: Well developed, well nourished, male in no acute distress. Head: Normocephalic, atraumatic.  Neck: Supple without bruits, No JVD. Lungs:  Resp regular and unlabored, CTA. Heart: RRR, S1, S2, no S3, S4, 2/6 systolic murmur; no rub. Abdomen: Soft, non-tender, non-distended, BS + x 4.  Extremities: No clubbing, cyanosis, No edema.  Neuro: Alert and oriented X 3. Moves all extremities  spontaneously. Psych: Normal affect.  LABS: CBC: Recent Labs  09/13/15 0248 09/15/15 0410  WBC 8.5 7.5  HGB 10.1* 10.6*  HCT 30.4* 31.8*  MCV 95.3 93.8  PLT 302 307   INR: Recent Labs  09/15/15 0410  INR [id number]*   Basic Metabolic Panel: Recent Labs  09/13/15 1026 09/15/15 0410  NA 139 137  K 3.2* 2.9*  CL 103 96*  CO2 20* 27  GLUCOSE 199* 297*  BUN 8 15  CREATININE 1.08 1.33*  CALCIUM 7.7* 7.7*   BNP:  B NATRIURETIC PEPTIDE  Date/Time Value Ref Range Status  09/12/2015 02:22 AM 1866.7* 0.0 - 100.0 pg/mL Final  09/06/2015 03:43 AM 339.2* 0.0 - 100.0 pg/mL Final      Current facility-administered medications:  .  0.9 %  sodium chloride infusion, , Intravenous, Continuous, Robbie Lis, MD, Last Rate: 10 mL/hr at 09/11/15 2232 .  acetaminophen (TYLENOL) tablet 650 mg, 650 mg, Oral, Q6H PRN, 650 mg at 09/15/15 0211 **OR** acetaminophen (TYLENOL) suppository 650 mg, 650 mg, Rectal, Q6H PRN, Samella Parr, NP .  amiodarone (PACERONE) tablet 400 mg, 400 mg, Oral, BID, Minus Breeding, MD, 400 mg at 09/14/15 2203 .  collagenase (SANTYL) ointment, , Topical, Daily, Robbie Lis, MD .  diltiazem (CARDIZEM CD) 24 hr capsule 240 mg, 240 mg, Oral,  Daily, Troy Sine, MD, 240 mg at 09/14/15 1142 .  furosemide (LASIX) injection 20 mg, 20 mg, Intravenous, Q12H, Barton Dubois, MD, 20 mg at 09/15/15 0802 .  insulin aspart (novoLOG) injection 0-9 Units, 0-9 Units, Subcutaneous, TID WC, Robbie Lis, MD, 5 Units at 09/15/15 0636 .  nitroGLYCERIN (NITROSTAT) SL tablet 0.4 mg, 0.4 mg, Sublingual, Once, Jeryl Columbia, NP, 0.4 mg at 09/09/15 0433 .  ondansetron (ZOFRAN) tablet 4 mg, 4 mg, Oral, Q6H PRN **OR** ondansetron (ZOFRAN) injection 4 mg, 4 mg, Intravenous, Q6H PRN, Samella Parr, NP .  pantoprazole (PROTONIX) EC tablet 40 mg, 40 mg, Oral, Daily, Samella Parr, NP, 40 mg at 09/14/15 1143 .  predniSONE (DELTASONE) tablet 10 mg, 10 mg, Oral, Q breakfast, Robbie Lis, MD, 10 mg at 09/15/15 [id number] .  senna-docusate (Senokot-S) tablet 2 tablet, 2 tablet, Oral, BID, Robbie Lis, MD .  sodium chloride flush (NS) 0.9 % injection 3 mL, 3 mL, Intravenous, Q12H, Samella Parr, NP, 3 mL at 09/14/15 2204 .  thiamine (VITAMIN B-1) tablet 100 mg, 100 mg, Oral, Daily, Samella Parr, NP, 100 mg at 09/14/15 1142 .  Warfarin - Pharmacist Dosing Inpatient, , Does not apply, q1800, Karren Cobble, RPH, Stopped at 09/13/15 1800 . sodium chloride 10 mL/hr at 09/11/15 2232    TELE: NSR        Radiology/Studies: Echo 09/06/15: - Left ventricle: The cavity size was normal. Wall thickness was  increased in a pattern of mild LVH. Systolic function was normal.  The estimated ejection fraction was in the range of 55% to 60%.  Wall motion was normal; there were no regional wall motion  abnormalities. Doppler parameters are consistent with abnormal  left ventricular relaxation (grade 1 diastolic dysfunction).  Doppler parameters are consistent with elevated ventricular  end-diastolic filling pressure. - Aortic valve: There was moderate stenosis. Valve area (VTI): 1.5  cm^2. Valve area (Vmax): 1.25 cm^2. Valve area (Vmean): 1.14  cm^2. - Left atrium: The atrium was mildly dilated. - Atrial septum: No defect or patent foramen ovale was identified. - Pulmonary arteries: PA peak pressure: 31 mm Hg (S).  Current Medications:  . amiodarone  400 mg Oral BID  . collagenase   Topical Daily  . diltiazem  240 mg Oral Daily  . furosemide  20 mg Intravenous Q12H  . insulin aspart  0-9 Units Subcutaneous TID WC  . nitroGLYCERIN  0.4 mg Sublingual Once  . pantoprazole  40 mg Oral Daily  . predniSONE  10 mg Oral Q breakfast  . senna-docusate  2 tablet Oral BID  . sodium chloride flush  3 mL Intravenous Q12H  . thiamine  100 mg Oral Daily  . Warfarin - Pharmacist Dosing Inpatient   Does not apply q1800   . sodium chloride 10 mL/hr at 09/11/15 2232     ASSESSMENT AND PLAN: Principal Problem:   Chest pain on exertion Active Problems:   Coronary atherosclerosis   Rheumatoid arthritis (HCC)   PMR (polymyalgia rheumatica) (HCC)   BPH (benign prostatic hyperplasia)   Chronic diastolic congestive heart failure (HCC)   Symptomatic cholelithiasis   Elevated troponin   Kidney disease, chronic, stage III (GFR 30-59 ml/min)   Anemia of chronic disease   Acute posthemorrhagic anemia   Abnormal thyroid function test   Atrial flutter (HCC)   Pain in the chest   Acute on chronic diastolic HF (heart failure) (HCC)   Atrial fibrillation with RVR ([place])  1. Atrial flutter: s/p TEE/DCCV yesterday, successful conversion to NSR. He is on Coumadin.  This patients CHA2DS2-VASc Score is at least 4. Above score calculated as 1 point each if present [CHF, HTN, DM, Vascular=MI/PAD/Aortic Plaque, Age if 65-74, or Male], 2 points each if present [Age > 75, or Stroke/TIA/TE].  Patient is on 240mg  Cardizem CD and 400 mg Amiodarone BID.  MD to advise on Amiodarone dosing.   2. Acute on chronic diastolic CHF: negative [id number] for admission. Echo report above, moderate AS. Denies SOB. On Furosemide 20mg  IV BID, has diuresed well.  Patient is back in NSR, the AV synchrony will help. Can discontinue IV lasix. Cr. Is 1.33 today.  Does not appear volume overloaded on exam.     Signed, Arbutus Leas , NP 10:16 AM 09/15/2015 Pager [phone]  Maint NSR decrease amiodarone to 200 bid and decrease lasix to 20 mg PO daily Probably ok for d/c in am   Jenkins Rouge

## 2015-09-15 NOTE — Progress Notes (Addendum)
Patient ID: Curtis Frazier, male   DOB: [birth year], 75 y.o.   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 Patient also has history of lumbar incision status post laminectomy earlier in February 2016. He was just discharged 3/27 after being in hospital for chest pain evaluation. Cardiology was consulted at that time and their recommendation was to continue medical management because patient's chest pain resolved.  He presented to [facility] with reports of epigastric and chest pain that radiates to the back, 10 out of 10 in intensity and with associated shortness of breath especially after ambulation.  On admission, he was hemodynamically stable. Initial thought was that he may have gallbladder related pain. HIDA scan was done and showed no acute cholecystitis. Liver enzymes are all within normal limits.  During this hospital stay, patient was seen by cardiology because of atrial fibrillation with RVR. Increasing amiodarone and adding Cardizem did not result in significant HR control. Pt underwent TEE with successful cardioversion.   Assessment/Plan:    Principal Problem: Chest pain with exertion / mild troponin elevation - Minimal elevation in troponin on admission, but now WNL - 2-D echo on this admission with ejection fraction of [id number], grade 1 diastolic dysfunction, normal wall motion - No further chest pain - Appreciate cardiology following   Active Problems: Atrial fibrillation, chronic; with acute uncontrolled episode and RVR / Typical atrial flutter  - Chads Vasc score at least 4 - Patient was on amiodarone and the dose was changed from 200 mg daily to 400 mg daily. In addition, cardizem was added but unable to  achieve HR less than 100 - S/P TEE 4/4 with successfully cardioversion - Will continue Cardizem 240 mg daily and amiodarone 200 mg BID   Acute on chronic diastolic congestive heart failure - Appreciate cardiology following - 2-D echo on this admission showed ejection fraction of 55% with grade 1 diastolic dysfunction - Per cardiology, Lasix started 20 mg IV every 12 hours. Stopped 09/15/15. - Continue daily weight and intake and output monitoring  Hypokalemia - Due to lasix - Supplemented   Lobar pneumonia, unspecified organism  - Was on Levaquin, started 3/30 --> 09/12/2015 - Respiratory status stable  Controlled diabetes mellitus without long term insulin use - Continue glipizide and SSI  Symptomatic cholelithiasis - HIDA scan normal - LFTs WNL, lipase WNL - Tolerating regular diet  - Since no acute cholecystitis surgery has signed off   History of laminectomy - Appreciate wound care assessment. - Wound measurement: 1cm x 0.3cm x 0.5cm  - Per wound care evaluation, wound has not yet healed at this point. The patient is past 2 week follow-up with neurosurgery. - Outpt follow up after discharge will be needed   Rheumatoid arthritis ([place]) / PMR (polymyalgia rheumatica) (HCC) - Continue prednisone per home dose   Anemia of chronic disease  - Related to history of rheumatoid arthritis and polymyalgia rheumatica as well as chronic kidney disease  - Hemoglobin stable at 10.1, 10.6  Chronic kidney disease stage III - Recent baseline creatinine 1.8  - Cr was WNL but little up this am, 1.33 likely due to lasix he ahs received in past 2 days prior to TEE - Lasix stopped today 4/5  DVT  Prophylaxis  - On coumadin    Code Status: Full.  Family Communication:  plan of care discussed with the patient; spoke with his Son at bedside 4/3 Disposition Plan: Home with [facility] tomorrow 4/5. Home health orders placed.   IV access:  Peripheral IV  Procedures and diagnostic studies:    Nm  Hepatobiliary Liver Func 09/08/2015   No evidence of cystic duct or common bile duct obstruction. Electronically Signed   By: Rolm Baptise M.D.   On: 09/08/2015 09:24   TEE 09/14/2015   Medical Consultants:  Surgery Cardiology  Other Consultants:  None  IAnti-Infectives:   Levaquin 09/09/2015 --> 09/12/2015   Leisa Lenz, MD  [facility] Pager [phone]  Time spent in minutes: 25 minutes  If 7PM-7AM, please contact night-coverage [email or web address] Password [email or web address] 09/15/2015, 1:48 PM   LOS: 8 days    HPI/Subjective: No acute overnight events. No chest pain, feels better.  Objective: Filed Vitals:   09/14/15 1100 09/14/15 1420 09/14/15 2157 09/15/15 0614  BP: 101/70 103/57 116/63 117/57  Pulse: 98 106 92 82  Temp:  98.3 F (36.8 C) 98.9 F (37.2 C) 97.8 F (36.6 C)  TempSrc:  Oral Oral Oral  Resp: 18 18 20 18   Height:      Weight:      SpO2: 96% 96% 93% 93%    Intake/Output Summary (Last 24 hours) at 09/15/15 1348 Last data filed at 09/15/15 1256  Gross per 24 hour  Intake      0 ml  Output   1800 ml  Net  -1800 ml    Exam:   General:  No distress, alert, awake   Cardiovascular: rate controlled, appreciate S1, S2  Respiratory: no wheezing, no rhonchi  Abdomen: (+) BS, non tender   Extremities: No swelling, palpable pulses   Neuro: Nonfocal   Data Reviewed: Basic Metabolic Panel:  Recent Labs Lab 09/09/15 1223 09/12/15 0222 09/13/15 0248 09/13/15 1026 09/15/15 0410  NA 137 141 137 139 137  K 4.5 3.7 3.6 3.2* 2.9*  CL 110 110 108 103 96*  CO2 16* 22 23 20* 27  GLUCOSE 256* 189* 215* 199* 297*  BUN 11 6 8 8 15   CREATININE 1.39* 1.13 1.24 1.08 1.33*  CALCIUM 7.3* 7.4* 7.2* 7.7* 7.7*  MG 2.0  --   --   --   --    Liver Function Tests: No results for input(s): AST, ALT, ALKPHOS, BILITOT, PROT, ALBUMIN in the last 168 hours. No results for input(s): LIPASE, AMYLASE in the last 168 hours. CBC:  Recent Labs Lab 09/09/15 1058  09/12/15 0222 09/13/15 0248 09/15/15 0410  WBC 8.9 8.2 8.5 7.5  HGB 10.6* 9.4* 10.1* 10.6*  HCT 32.5* 30.0* 30.4* 31.8*  MCV 97.6 95.5 95.3 93.8  PLT 257 305 302 307   Cardiac Enzymes:  Recent Labs Lab 09/09/15 0426 09/09/15 1058 09/09/15 1608  TROPONINI 0.04* 0.03 <0.03   CBG:  Recent Labs Lab 09/14/15 1126 09/14/15 1633 09/14/15 2155 09/15/15 0619 09/15/15 1217  GLUCAP 209* 286* 253* 261* 319*    Recent Results (from the past 240 hour(s))  Blood Culture (routine x 2)     Status: None   Collection Time: 09/07/15  2:40 PM  Result Value Ref Range Status   Specimen Description BLOOD RIGHT HAND  Final   Special Requests BOTTLES DRAWN AEROBIC AND ANAEROBIC 5CC  Final   Culture NO GROWTH 5 DAYS  Final   Report Status 09/12/2015 FINAL  Final  Blood  Culture (routine x 2)     Status: None   Collection Time: 09/07/15  2:50 PM  Result Value Ref Range Status   Specimen Description BLOOD LEFT ANTECUBITAL  Final   Special Requests BOTTLES DRAWN AEROBIC AND ANAEROBIC 5CC  Final   Culture NO GROWTH 5 DAYS  Final   Report Status 09/12/2015 FINAL  Final  Urine culture     Status: None   Collection Time: 09/07/15  5:20 PM  Result Value Ref Range Status   Specimen Description URINE, CLEAN CATCH  Final   Special Requests NONE  Final   Culture MULTIPLE SPECIES PRESENT, SUGGEST RECOLLECTION  Final   Report Status 09/09/2015 FINAL  Final     Scheduled Meds: . amiodarone  200 mg Oral BID  . collagenase   Topical Daily  . diltiazem  240 mg Oral Daily  . insulin aspart  0-9 Units Subcutaneous TID WC  . nitroGLYCERIN  0.4 mg Sublingual Once  . pantoprazole  40 mg Oral Daily  . predniSONE  10 mg Oral Q breakfast  . senna-docusate  2 tablet Oral BID  . sodium chloride flush  3 mL Intravenous Q12H  . thiamine  100 mg Oral Daily  . Warfarin - Pharmacist Dosing Inpatient   Does not apply q1800   Continuous Infusions: . sodium chloride 10 mL/hr at 09/11/15 2232

## 2015-09-15 NOTE — Progress Notes (Signed)
Physical Therapy Treatment Patient Details Name: Curtis Frazier MRN: [id number] DOB: September 20, 1940 Today's Date: 09/15/2015    History of Present Illness Curtis Frazier is a 75 y.o. male with a Past Medical History ofCAD s/p Cath w/ angioplasty, DM, MI, CHF, PAF, GI bleeds - AVMs, HLD, HTN, PRM, Psoriasis, who presents with symptomatitic cholelithiasis. No direct evidence of acute cholecystitis. Stress dose steroids for hypotension due to adrenal insufficiency.  Developed chest pain, was in a-flutter w/RVR and underwent cardioversion earlier today and back to sinus rhythm.     PT Comments    Patient feeling well today. Improved ambulation distance. Continues to have dyspnea on exertion. HR up to 103 bpm during activity. Pt with some balance deficits especially when backing up or turning with RW. Encouraged ambulation 2 more times today with RN. Pt agreeable. Will follow acutely.   Follow Up Recommendations  Home health PT;Supervision for mobility/OOB     Equipment Recommendations  None recommended by PT    Recommendations for Other Services       Precautions / Restrictions Precautions Precautions: Back;Fall Precaution Comments: Recent spine surgery 07/15/15 Restrictions Weight Bearing Restrictions: No    Mobility  Bed Mobility               General bed mobility comments: UP in chair upon PT arrival.   Transfers Overall transfer level: Needs assistance Equipment used: Rolling walker (2 wheeled) Transfers: Sit to/from Stand Sit to Stand: Min guard         General transfer comment: Min guard for safety. Stood from [employer], from toilet x1.   Ambulation/Gait Ambulation/Gait assistance: Min assist Ambulation Distance (Feet): 150 Feet Assistive device: Rolling walker (2 wheeled) Gait Pattern/deviations: Step-through pattern;Decreased stride length;Trunk flexed Gait velocity: decreased   General Gait Details: Slow, mostly steady gait however imbalance noted with retrogait  and turning with MIn A to correct for LOB. 3/4 DOE. HR stable.    Stairs            Wheelchair Mobility    Modified Rankin (Stroke Patients Only)       Balance Overall balance assessment: Needs assistance Sitting-balance support: Feet supported;No upper extremity supported Sitting balance-Leahy Scale: Good     Standing balance support: During functional activity Standing balance-Leahy Scale: Fair Standing balance comment: Able to stand at sink and wash hands, reaching outside BoS without difficult.y                     Cognition Arousal/Alertness: Awake/alert Behavior During Therapy: WFL for tasks assessed/performed Overall Cognitive Status: Within Functional Limits for tasks assessed                      Exercises      General Comments        Pertinent Vitals/Pain Pain Assessment: No/denies pain Faces Pain Scale: No hurt    Home Living                      Prior Function            PT Goals (current goals can now be found in the care plan section) Progress towards PT goals: Progressing toward goals    Frequency  Min 3X/week    PT Plan Current plan remains appropriate    Co-evaluation             End of Session Equipment Utilized During Treatment: Gait belt Activity Tolerance: Patient tolerated treatment well  Patient left: in chair;with call bell/phone within reach     Time: 0912-0927 PT Time Calculation (min) (ACUTE ONLY): 15 min  Charges:  $Gait Training: 8-22 mins                    G Codes:      [name] A [name] 09/15/2015, 11:56 AM Wray Kearns, PT, DPT [phone]

## 2015-09-15 NOTE — Progress Notes (Signed)
Inpatient Diabetes Program Recommendations  AACE/ADA: New Consensus Statement on Inpatient Glycemic Control (2015)  Target Ranges:  Prepandial:   less than 140 mg/dL      Peak postprandial:   less than 180 mg/dL (1-2 hours)      Critically ill patients:  140 - 180 mg/dL   Review of Glycemic Control  Inpatient Diabetes Program Recommendations:  Insulin - Basal: add Lantus 10 units  Correction (SSI): increase to moderate scale during steroid therapy Thank you  Raoul Pitch BSN, RN,CDE Inpatient Diabetes Coordinator [phone] (team pager)

## 2015-09-16 DIAGNOSIS — D638 Anemia in other chronic diseases classified elsewhere: Secondary | ICD-10-CM

## 2015-09-16 LAB — BASIC METABOLIC PANEL
Anion gap: 13 (ref 5–15)
BUN: 21 mg/dL — ABNORMAL HIGH (ref 6–20)
CHLORIDE: 97 mmol/L — AB (ref 101–111)
CO2: 25 mmol/L (ref 22–32)
CREATININE: 1.42 mg/dL — AB (ref 0.61–1.24)
Calcium: 8.6 mg/dL — ABNORMAL LOW (ref 8.9–10.3)
GFR, EST AFRICAN AMERICAN: 54 mL/min — AB (ref >60)
GFR, EST NON AFRICAN AMERICAN: 46 mL/min — AB (ref >60)
Glucose, Bld: 273 mg/dL — ABNORMAL HIGH (ref 65–99)
Potassium: 3.5 mmol/L (ref 3.5–5.1)
SODIUM: 135 mmol/L (ref 135–145)

## 2015-09-16 LAB — CBC
HCT: 32.9 % — ABNORMAL LOW (ref 39.0–52.0)
Hemoglobin: 10.6 g/dL — ABNORMAL LOW (ref 13.0–17.0)
MCH: 30 pg (ref 26.0–34.0)
MCHC: 32.2 g/dL (ref 30.0–36.0)
MCV: 93.2 fL (ref 78.0–100.0)
PLATELETS: 345 10*3/uL (ref 150–400)
RBC: 3.53 MIL/uL — AB (ref 4.22–5.81)
RDW: 15.7 % — ABNORMAL HIGH (ref 11.5–15.5)
WBC: 8.5 10*3/uL (ref 4.0–10.5)

## 2015-09-16 LAB — GLUCOSE, CAPILLARY
GLUCOSE-CAPILLARY: 261 mg/dL — AB (ref 65–99)
GLUCOSE-CAPILLARY: 304 mg/dL — AB (ref 65–99)

## 2015-09-16 LAB — PROTIME-INR
INR: 2.72 — ABNORMAL HIGH (ref 0.00–1.49)
Prothrombin Time: 28.4 seconds — ABNORMAL HIGH (ref 11.6–15.2)

## 2015-09-16 MED ORDER — AMIODARONE HCL 200 MG PO TABS: 400.0000 mg | ORAL_TABLET | Freq: Two times a day (BID) | ORAL | Status: DC

## 2015-09-16 MED ORDER — AMIODARONE HCL 400 MG PO TABS: 400.0000 mg | ORAL_TABLET | Freq: Two times a day (BID) | ORAL | Status: DC

## 2015-09-16 MED ORDER — FENTANYL CITRATE (PF) 100 MCG/2ML IJ SOLN: 25.0000 ug | INTRAMUSCULAR | Status: DC | PRN

## 2015-09-16 MED ORDER — FUROSEMIDE 20 MG PO TABS: 20.0000 mg | ORAL_TABLET | Freq: Every day | ORAL | Status: DC

## 2015-09-16 MED ORDER — WARFARIN SODIUM 2.5 MG PO TABS: 2.5000 mg | ORAL_TABLET | Freq: Every day | ORAL | Status: DC

## 2015-09-16 MED ORDER — WARFARIN SODIUM 2.5 MG PO TABS: 2.5000 mg | ORAL_TABLET | Freq: Once | ORAL | Status: DC

## 2015-09-16 MED ORDER — PREDNISONE 10 MG PO TABS: 10.0000 mg | ORAL_TABLET | Freq: Every day | ORAL | Status: DC

## 2015-09-16 MED ORDER — GLIPIZIDE 5 MG PO TABS: 5.0000 mg | ORAL_TABLET | Freq: Two times a day (BID) | ORAL | Status: DC

## 2015-09-16 MED ORDER — DILTIAZEM HCL ER COATED BEADS 240 MG PO CP24: 240.0000 mg | ORAL_CAPSULE | Freq: Every day | ORAL | Status: DC

## 2015-09-16 NOTE — Progress Notes (Signed)
ANTICOAGULATION CONSULT NOTE - Follow Up Consult  Pharmacy Consult for Coumadin Indication: atrial fibrillation  Allergies  Allergen Reactions  . Ace Inhibitors Other (See Comments)    Causes high K  . Angiotensin Receptor Blockers Other (See Comments)    Causes high K  . Metoprolol Other (See Comments)    May cause elevated K level  . Penicillins Swelling and Rash    Has patient had a PCN reaction causing immediate rash, facial/tongue/throat swelling, SOB or lightheadedness with hypotension: YES Has patient had a PCN reaction causing severe rash involving mucus membranes or skin necrosis: NO Has patient had a PCN reaction that required hospitalization NO Has patient had a PCN reaction occurring within the last 10 years: NO If all of the above answers are "NO", then may proceed with Cephalosporin use.  . Tetracycline Swelling and Rash    Patient Measurements: Height: 5\' 8"  (172.7 cm) Weight: 180 lb 8.9 oz (81.9 kg) IBW/kg (Calculated) : 68.4   Vital Signs: Temp: 98.6 F (37 C) (04/06 0459) Temp Source: Oral (04/06 0459) BP: 119/57 mmHg (04/06 0459) Pulse Rate: 113 (04/06 0459)  Labs:  Recent Labs  09/13/15 1026 09/14/15 0252 09/15/15 0410 09/16/15 0219  HGB  --   --  10.6* 10.6*  HCT  --   --  31.8* 32.9*  PLT  --   --  307 345  LABPROT  --  29.4* 28.7* 28.4*  INR  --  2.84* 2.75* 2.72*  CREATININE 1.08  --  1.33* 1.42*    Estimated Creatinine Clearance: 42.8 mL/min (by C-G formula based on Cr of 1.42).   Assessment: 57 yom with history of aFib on warfarin PTA. Pharmacy consulted to dose warfarin, held 3/28. Surgery note says no lap chole required this admission, restarted warfarin on 3/29. INR= 2.72. She is noted on amiodarone (200mg  po bid and was on 200mg  po daily PTA). She is also s/p successful DCCV on 4/4.  - PTA warf: 10 mg x2 days then 5 mg daily (coumadin was started 08/03/15).  Per chart history it appears she is typically taking coumadin  5mg /d  Goal of Therapy:  INR 2-3 Monitor platelets by anticoagulation protocol: Yes   Plan:  -Coumadin 2.5mg  po today if remains inpatient -Consider 2.5mg /daily upon d/c (coumadin dose has been 1-3 mg/day in the last few days) with close INR follow-up  Hildred Laser, Pharm D 09/16/2015 8:00 AM

## 2015-09-16 NOTE — Progress Notes (Signed)
Reviewed discharge instructions with patient. IV removed. No issues at present. Awaiting discharge.   Sten Dematteo Brooke RN  

## 2015-09-16 NOTE — Care Management Note (Signed)
Case Management Note Marvetta Gibbons RN, BSN Unit 2W-Case Manager [phone]  Patient Details  Name: Curtis Frazier MRN: [id number] Date of Birth: 09/11/1940  Subjective/Objective:     Pt admitted with Symptomatic cholelithiasis/ GERD and ?SIRS               Action/Plan: PTA pt lived at home with wife- active with [facility] for HHRN/PT- will need resumption orders if plan is to return home- CM to follow progression for needs and plan- recently went home from [facility]  Expected Discharge Date:     09/16/15             Expected Discharge Plan:  [place]  In-House Referral:     Discharge planning Services  CM Consult  Post Acute Care Choice:  Home Health, Resumption of Svcs/PTA Provider Choice offered to:  Patient  DME Arranged:    DME Agency:     HH Arranged:  RN, PT, OT, Nurse's Aide [place] Agency:  [place]  Status of Service:  Completed, signed off  Medicare Important Message Given:  Yes Date Medicare IM Given:    Medicare IM give by:    Date Additional Medicare IM Given:    Additional Medicare Important Message give by:     If discussed at [place] of Stay Meetings, dates discussed:    Additional Comments:  09/16/15- notified Mary with [facility] of pt's discharge and resumption of [place] orders  Dahlia Client Romeo Rabon, RN 09/16/2015, 5:05 PM

## 2015-09-16 NOTE — Discharge Summary (Signed)
Physician Discharge Summary  Curtis Frazier [id number] DOB: [birth year] DOA: 09/07/2015  PCP: Loura Pardon, MD  Admit date: 09/07/2015 Discharge date: 09/16/2015  Time spent: 35 minutes  Recommendations for Outpatient Follow-up:  1. Bring blood sugar log to PCP and they can make adjustments to regimen 2. INR 1 week 3. BMP 1 week 4. Home health   Discharge Diagnoses:  Principal Problem:   Chest pain on exertion Active Problems:   Coronary atherosclerosis   Rheumatoid arthritis (HCC)   PMR (polymyalgia rheumatica) (HCC)   BPH (benign prostatic hyperplasia)   Chronic diastolic congestive heart failure (HCC)   Symptomatic cholelithiasis   Elevated troponin   Kidney disease, chronic, stage III (GFR 30-59 ml/min)   Anemia of chronic disease   Acute posthemorrhagic anemia   Abnormal thyroid function test   Atrial flutter (HCC)   Pain in the chest   Acute on chronic diastolic HF (heart failure) (HCC)   Atrial fibrillation with RVR ([place])   Discharge Condition: improved  Diet recommendation: cardiac/diabetic diet  Filed Weights   09/07/15 0955 09/08/15 1006  Weight: 77.565 kg (171 lb) 81.9 kg (180 lb 8.9 oz)    History of present illness:  75 year old male patient with history of polymyalgia rheumatica on chronic steroids, diabetes on oral agents, history of atrial fibrillation on amiodarone, history of rheumatoid arthritis, lumbar stenosis with outpatient lumbar discectomy this year, aortic stenosis with associated diastolic dysfunction, Hypertension, dyslipidemia and constipation as well as nonspecified anemia. Patient was recently discharged from [facility] after a protracted hospitalization for sepsis. Hospital course was complicated by ventilator-dependent respiratory failure, acute diastolic heart exacerbation as well as A. fib with RVR requiring cardioversion. He had persistent hypoxemia and was severely deconditioned and had issues with confusion and encephalopathy.His acute renal  failure eventually cleared. Patient was started on warfarin during the admission. And he was discharged to home on 3/23. He was readmitted to the hospital on 3/27 for chest pain thought to be noncardiac and subsequently discharged on the same date.   He returns to the ER today on 3/28 for significant epigastric chest pain radiating up between the shoulder blades. In further clarification with the patient Saturday night into Sunday morning patient had significant indigestion with pain radiating into the back that lasted several hours through the night. He has not noticed any similar symptoms after eating. Prior to most recent hospitalizations he not had any trouble with this type of chest discomfort. He did not have any shortness of breath or diaphoresis with the chest pain. He has been constipated from iron pills and did well while receiving stool softeners in the hospital but did not continue these after discharge. Of note patient had been on prednisone 5 mg daily prior to the February admission. At time of discharge he was discharged on 10 mg with instructions to eventually decrease to 5 mg daily and to notify either his PCP or his rheumatologist if he develops "any trouble". The daughter states that since previous discharge on last Thursday 3/23 patient has not had any prednisone because he did not have a refill and did not have any pills at the house. His only other symptoms have been pleuritic chest pain with taking a deep breath without productive cough.  Hospital Course:  Chest pain with exertion / mild troponin elevation - Minimal elevation in troponin on admission, but now WNL - 2-D echo on this admission with ejection fraction of [id number], grade 1 diastolic dysfunction, normal wall motion - No further chest  pain - Appreciate cardiology following   Atrial fibrillation, chronic; with acute uncontrolled episode and RVR / Typical atrial flutter  - Chads Vasc score at least 4- on coumadin - Patient was  on amiodarone and the dose was changed from 200 mg daily to 400 mg daily. In addition, cardizem was added but unable to achieve HR less than 100 - S/P TEE 4/4 with successfully cardioversion that failed on 4/5 PM - Will continue Cardizem 240 mg daily and amiodarone 400 mg BID- per cards recommendations  Acute on chronic diastolic congestive heart failure - Appreciate cardiology following - 2-D echo on this admission showed ejection fraction of 55% with grade 1 diastolic dysfunction - Per cardiology, Lasix started 20 mg IV every 12 hours. Stopped 09/15/15 and changed to PO at 20 mg   Hypokalemia - Due to lasix - Supplemented   Lobar pneumonia, unspecified organism  - Was on Levaquin, started 3/30 --> 09/12/2015 - Respiratory status stable  Controlled diabetes mellitus without long term insulin use - Continue glipizide at a higher dose at home== not sure patient would be able to do lantus after speaking with patient  Symptomatic cholelithiasis - HIDA scan normal - LFTs WNL, lipase WNL - Tolerating regular diet  - Since no acute cholecystitis surgery has signed off   History of laminectomy - Appreciate wound care assessment. - Wound measurement: 1cm x 0.3cm x 0.5cm  - Per wound care evaluation, wound has not yet healed at this point. The patient is past 2 week follow-up with neurosurgery. - Outpt follow up after discharge will be needed   Rheumatoid arthritis ([place]) / PMR (polymyalgia rheumatica) (HCC) - Continue prednisone per home dose-- 10 mg tapered to home 5 mg  Anemia of chronic disease  - Related to history of rheumatoid arthritis and polymyalgia rheumatica as well as chronic kidney disease  - Hemoglobin stable   Chronic kidney disease stage III - Recent baseline creatinine 1.8  - Cr was WNL but little up this am, 1.33 likely due to lasix he ahs received in past 2 days prior to TEE -lasix 20 mg daily- BMP 1  week  Procedures:  HIDA  TEE  Consultations:  Cards  Surgery  Wound care  Discharge Exam: Filed Vitals:   09/16/15 0921 09/16/15 1335  BP: 96/48 105/45  Pulse:  96  Temp:  97.8 F (36.6 C)  Resp:  18    General: anxious to go home   Discharge Instructions   Discharge Instructions    AMB Referral to [facility]    Complete by:  As directed   Please assign to [facility] for transition of care. Multiple comorbidities including history of CHF, HTN, DM. Multiple admissions. Written consent signed. Currently at [facility]. Please call with questions. Marthenia Rolling, [place], RN,BSN-[facility] Liaison-[phone]  Reason for consult:  Please assign to [facility] RNCM  Diagnoses of:   Diabetes Heart Failure    Expected date of contact:  1-3 days (reserved for hospital discharges)     Diet - low sodium heart healthy    Complete by:  As directed      Diet Carb Modified    Complete by:  As directed      Discharge instructions    Complete by:  As directed   Home health  INR 1 week BMp 1 week Bring blood sugar log to PCP     Increase activity slowly    Complete by:  As directed  Current Discharge Medication List    START taking these medications   Details  diltiazem (CARDIZEM CD) 240 MG 24 hr capsule Take 1 capsule (240 mg total) by mouth daily. Qty: 30 capsule, Refills: 0    furosemide (LASIX) 20 MG tablet Take 1 tablet (20 mg total) by mouth daily. Qty: 30 tablet, Refills: 0      CONTINUE these medications which have CHANGED   Details  amiodarone (PACERONE) 400 MG tablet Take 1 tablet (400 mg total) by mouth 2 (two) times daily. Qty: 60 tablet, Refills: 0    glipiZIDE (GLUCOTROL) 5 MG tablet Take 1 tablet (5 mg total) by mouth 2 (two) times daily before a meal. Qty: 60 tablet, Refills: 0    predniSONE (DELTASONE) 10 MG tablet Take 1 tablet (10 mg total) by mouth daily with breakfast. For 1 week then decrease to 5 mg daily Qty:  20 tablet, Refills: 0    warfarin (COUMADIN) 2.5 MG tablet Take 1 tablet (2.5 mg total) by mouth daily at 6 PM. Qty: 15 tablet, Refills: 0      CONTINUE these medications which have NOT CHANGED   Details  acetaminophen (TYLENOL) 325 MG tablet Take 650 mg by mouth every 6 (six) hours as needed (Pain scale of 1-4 or temp > 101).    calcium-vitamin D (OSCAL WITH D) 500-200 MG-UNIT tablet Take 1 tablet by mouth daily with breakfast.    ferrous sulfate 325 (65 FE) MG tablet Take 1 tablet (325 mg total) by mouth 3 (three) times daily with meals. Qty: 90 tablet, Refills: 0    pantoprazole (PROTONIX) 40 MG tablet Take 1 tablet (40 mg total) by mouth daily. Qty: 30 tablet, Refills: 0    thiamine 100 MG tablet Take 1 tablet (100 mg total) by mouth daily. Qty: 30 tablet, Refills: 0      STOP taking these medications     senna (SENOKOT) 8.6 MG TABS tablet        Allergies  Allergen Reactions  . Ace Inhibitors Other (See Comments)    Causes high K  . Angiotensin Receptor Blockers Other (See Comments)    Causes high K  . Metoprolol Other (See Comments)    May cause elevated K level  . Penicillins Swelling and Rash    Has patient had a PCN reaction causing immediate rash, facial/tongue/throat swelling, SOB or lightheadedness with hypotension: YES Has patient had a PCN reaction causing severe rash involving mucus membranes or skin necrosis: NO Has patient had a PCN reaction that required hospitalization NO Has patient had a PCN reaction occurring within the last 10 years: NO If all of the above answers are "NO", then may proceed with Cephalosporin use.  . Tetracycline Swelling and Rash   Follow-up Information    Follow up with [facility].   Why:  HH-RN/PT/OT/aide resumed   Contact information:   [address] [place] [address] [phone]       Follow up with Loura Pardon, MD In 1 week.   Specialties:  Family Medicine, Radiology   Why:  for blood sugar  follow up   Contact information:   [place] [place] [address] [phone]       Please follow up.   Why:  for coumadin check 1 week      Follow up with [facility].   Specialty:  Cardiology   Why:  will be called with follow  up in atrial fibrillation clinic   Contact information:   [address] [id number] [place] [phone]      Follow up with Ophelia Charter, MD.   Specialty:  Neurosurgery   Why:  first available appointment   Contact information:   [place]. [address] [place]  [address] [phone]        The results of significant diagnostics from this hospitalization (including imaging, microbiology, ancillary and laboratory) are listed below for reference.    Significant Diagnostic Studies: Dg Chest 2 View  09/07/2015  CLINICAL DATA:  Chest pain EXAM: CHEST  2 VIEW COMPARISON:  09/05/2015 FINDINGS: Cardiac shadow is stable. The lungs are well aerated bilaterally. No focal infiltrate is noted. No sizable effusion is seen. Mild interstitial changes are noted stable from the previous exam. Postsurgical changes are noted. Mid thoracic compression deformity is again noted and stable. IMPRESSION: No acute abnormality noted. Electronically Signed   By: Inez Catalina M.D.   On: 09/07/2015 10:39   Dg Chest 2 View  09/05/2015  CLINICAL DATA:  Acute onset of mid chest pain, radiating to the back. Initial encounter. EXAM: CHEST  2 VIEW COMPARISON:  Chest radiograph from 08/17/2015 FINDINGS: The lungs are well-aerated. Peribronchial thickening is noted, with mild bilateral atelectasis or scarring. There is no evidence of pleural effusion or pneumothorax. The heart is borderline normal in size. No acute osseous abnormalities are seen. Thoracolumbar spinal fusion hardware is noted. Chronic compression deformities are noted at the mid thoracic spine. IMPRESSION:  Peribronchial thickening, with mild bilateral atelectasis or scarring. Lungs otherwise grossly clear. Electronically Signed   By: Garald Balding M.D.   On: 09/05/2015 21:19   Dg Chest 2 View  08/17/2015  CLINICAL DATA:  Cough.  Recent back surgery. EXAM: CHEST  2 VIEW COMPARISON:  08/12/2015 FINDINGS: The heart size and mediastinal contours are within normal limits. Mild subsegmental atelectasis in the left lower lung shows no significant change. No evidence of pneumothorax or pleural effusion. No evidence of pulmonary consolidation or edema. Right arm PICC line is seen in appropriate position. Posterior thoracolumbar spine fusion hardware again noted. IMPRESSION: Mild left lower lobe atelectasis, without significant change. No evidence of pulmonary consolidation or pleural effusion. Electronically Signed   By: Earle Gell M.D.   On: 08/17/2015 18:19   Nm Hepatobiliary Liver Func  09/08/2015  CLINICAL DATA:  Epigastric pain.  Symptomatic cholelithiasis EXAM: NUCLEAR MEDICINE HEPATOBILIARY IMAGING TECHNIQUE: Sequential images of the abdomen were obtained out to 60 minutes following intravenous administration of radiopharmaceutical. RADIOPHARMACEUTICALS:  5.2 mCi Tc-11m  Choletec IV COMPARISON:  Ultrasound 09/07/2015 FINDINGS: There is prompt uptake and excretion of radiotracer by the liver. Activity is seen within small bowel and gallbladder by 25-30 minutes. IMPRESSION: No evidence of cystic duct or common bile duct obstruction. Electronically Signed   By: Rolm Baptise M.D.   On: 09/08/2015 09:24   US Abdomen Limited  09/07/2015  CLINICAL DATA:  Right upper quadrant abdominal pain since last Friday. EXAM: US ABDOMEN LIMITED - RIGHT UPPER QUADRANT COMPARISON:  None. FINDINGS: Gallbladder: Multiple echogenic shadowing gallstones. The largest measures 9 mm. No gallbladder wall thickening, pericholecystic fluid or sonographic Murphy sign to suggest acute cholecystitis. Common bile duct: Diameter: 4.0 mm Liver:  Normal echogenicity without focal lesion or biliary dilatation. IMPRESSION: Cholelithiasis without sonographic findings for acute cholecystitis. Normal caliber common bile duct and normal liver. Electronically Signed   By: Marijo Sanes M.D.   On: 09/07/2015 13:51  Dg Chest Port 1 View  09/09/2015  CLINICAL DATA:  Chest pain, former smoker, coronary artery disease treated with stent placement. EXAM: PORTABLE CHEST 1 VIEW COMPARISON:  PA and lateral chest x-ray of September 07, 2015 FINDINGS: The patient is positioned in a lordotic fashion. The lungs are adequately inflated. The interstitial markings are coarse but stable. The cardiac silhouette remains enlarged. The central pulmonary vascularity is mildly prominent. There is no pleural effusion. There is no pneumothorax. The bony thorax is unremarkable. IMPRESSION: Limited study due to patient positioning. Stable cardiomegaly with mild pulmonary vascular congestion. Chronically increased pulmonary interstitial markings. No definite acute pneumonia but direct comparison with the earlier study is limited due to changes in patient positioning. Electronically Signed   By: David  Martinique M.D.   On: 09/09/2015 08:05   Dg Abd 2 Views  09/07/2015  CLINICAL DATA:  Constipation and abdomen pain for 3 days EXAM: ABDOMEN - 2 VIEW COMPARISON:  None. FINDINGS: Spinal rods noted. Nonobstructive bowel gas pattern. Moderate fecal retention primarily through the mid colon. Trace lucency on the decubitus view over the edge of the liver. IMPRESSION: Mild to moderate fecal retention. Suggestion of possible free right upper quadrant. Would suggest CT of the abdomen and pelvis. Electronically Signed   By: Skipper Cliche M.D.   On: 09/07/2015 17:42   Dg Swallowing Func-speech Pathology  08/25/2015  Objective Swallowing Evaluation: Type of Study: MBS-Modified Barium Swallow Study Patient Details Name: MALIKYE COLLICK MRN: [id number] Date of Birth: 1941/04/03 Today's Date: 08/25/2015 Time:  SLP Start Time (ACUTE ONLY): 0940-SLP Stop Time (ACUTE ONLY): 1015 SLP Time Calculation (min) (ACUTE ONLY): 35 min Past Medical History: Past Medical History Diagnosis Date . Other and unspecified hyperlipidemia  . Coronary atherosclerosis of unspecified type of vessel, native or graft  . Personal history of colonic polyps  . Actinic keratosis  . Psoriatic arthropathy ([place])  . Other psoriasis  . Scoliosis (and kyphoscoliosis), idiopathic  . GI bleed 12/10   cecam AVM . Gastritis 12/10 . Hyperlipidemia    takes Pravastatin daily . Hypertension    takes Metoprolol daily . Shortness of breath    with exertion . Headache(784.0)    occasionally . Joint pain  . Joint swelling  . Chronic back pain    scoliosis/stenosis/radiculopathy,degenerative disc disease . Psoriasis  . Bruises easily  . Esophageal reflux    takes Omeprazole daily . History of colonic polyps  . Hemorrhoids  . Kidney stone    hx of . Blood transfusion 2010 . PMR (polymyalgia rheumatica) (HCC) 01/20/2012   tx by specialist on a long prednisone taper . S/P PTCA (percutaneous transluminal coronary angioplasty)  . Rheumatoid arthritis(714.0)    takes Methotrexate 7pills weekly . Degeneration of intervertebral disc, site unspecified  . Myocardial infarction ([place]) 2010 . Pneumonia 2008; 07/2015 . Type II or unspecified type diabetes mellitus without mention of complication, not stated as uncontrolled  . Skin cancer    "arms" Past Surgical History: Past Surgical History Procedure Laterality Date . Rotator cuff repair Left 2000 . Colonoscopy   . Vasectomy  1979 . Coronary angioplasty with stent placement  02/2009   has one stent . Carotid doppler  10/12   0-39% R and 60-79% left  . Cataract extraction  2012 . Cardiac catheterization  2010 . Lithotripsy  2009 . Tee without cardioversion N/A 08/06/2015   Procedure: TRANSESOPHAGEAL ECHOCARDIOGRAM (TEE);  Surgeon: Skeet Latch, MD;  Location: [facility];  Service: Cardiovascular;  Laterality: N/A; . Cardioversion  N/A  08/06/2015   Procedure: CARDIOVERSION;  Surgeon: Skeet Latch, MD;  Location: [facility] ENDOSCOPY;  Service: Cardiovascular;  Laterality: N/A; . Back surgery   . Lumbar laminectomy  07/2015 . Posterior laminectomy thoracic spine     "fixed the discs" . Skin cancer excision     "arms" HPI: 75 year old male with history of hypertension, GERD, A. fib, sepsis related to pneumonia with related shock, respiratory failure requiring intubation, A. fib with RVR admitted from [facility] with AMS suspicious for infectious vs medication induced etiologies. FEES 3/2 with moderate oropharyngeal dysphagia with recommendations for dysphagia 1 textures with honey thick liquids via tsp. Patient admitted to [facility] 3/7 and has been participating in dysphagia treatment. MBS today to assess for possible diet upgrade.  Subjective: pt is eager for a cup of coffee Assessment / Plan / Recommendation CHL IP CLINICAL IMPRESSIONS 08/25/2015 Therapy Diagnosis Mild oral phase dysphagia;Mild pharyngeal phase dysphagia Clinical Impression Patient demonstrates a mild oropharyngeal dysphagia characterized by delayed oral transit with piecemeal swallowing with delayed swallow initiation resulting in impaired timing of swallow and intermittent silent penetration of nectar-thick and thin liquids via cup. A cued cough was effective in expelling penetrates from the laryngeal vestibule and a chin tuck was effective in eliminating penetration episodes. Recommend patient initiate a diet of regular textures with thin liquids with full supervision for utilization of swallowing compensatory strategies.  Patient and daughter verbalize understanding of all information.  Impact on safety and function Mild aspiration risk   CHL IP TREATMENT RECOMMENDATION 08/25/2015 Treatment Recommendations Therapy as outlined in treatment plan below   Prognosis 08/25/2015 Prognosis for Safe Diet Advancement Good Barriers to Reach Goals -- Barriers/Prognosis Comment -- CHL  IP DIET RECOMMENDATION 08/25/2015 SLP Diet Recommendations Regular solids;Thin liquid Liquid Administration via Cup;No straw Medication Administration Crushed with puree Compensations Minimize environmental distractions;Slow rate;Small sips/bites;Multiple dry swallows after each bite/sip;Hard cough after swallow;Chin tuck Postural Changes Seated upright at 90 degrees   CHL IP OTHER RECOMMENDATIONS 08/25/2015 Recommended Consults -- Oral Care Recommendations Oral care BID Other Recommendations --   CHL IP FOLLOW UP RECOMMENDATIONS 08/25/2015 Follow up Recommendations Outpatient SLP;24 hour supervision/assistance   CHL IP FREQUENCY AND DURATION 08/25/2015 Speech Therapy Frequency (ACUTE ONLY) min 5x/week Treatment Duration 2 weeks      CHL IP ORAL PHASE 08/25/2015 Oral Phase Impaired Oral - Pudding Teaspoon -- Oral - Pudding Cup -- Oral - Honey Teaspoon Piecemeal swallowing;Delayed oral transit Oral - Honey Cup Piecemeal swallowing;Delayed oral transit Oral - Nectar Teaspoon Piecemeal swallowing;Delayed oral transit Oral - Nectar Cup Piecemeal swallowing;Delayed oral transit Oral - Nectar Straw -- Oral - Thin Teaspoon Piecemeal swallowing;Delayed oral transit Oral - Thin Cup Delayed oral transit;Piecemeal swallowing Oral - Thin Straw Delayed oral transit;Piecemeal swallowing Oral - Puree Delayed oral transit;Piecemeal swallowing Oral - Mech Soft Delayed oral transit;Piecemeal swallowing Oral - Regular -- Oral - Multi-Consistency -- Oral - Pill -- Oral Phase - Comment --  CHL IP PHARYNGEAL PHASE 08/25/2015 Pharyngeal Phase Impaired Pharyngeal- Pudding Teaspoon -- Pharyngeal -- Pharyngeal- Pudding Cup -- Pharyngeal -- Pharyngeal- Honey Teaspoon Delayed swallow initiation-vallecula;Reduced tongue base retraction Pharyngeal Material does not enter airway Pharyngeal- Honey Cup Delayed swallow initiation-vallecula;Pharyngeal residue - valleculae Pharyngeal Material does not enter airway Pharyngeal- Nectar Teaspoon Delayed swallow  initiation-vallecula;Pharyngeal residue - valleculae Pharyngeal Material does not enter airway Pharyngeal- Nectar Cup Compensatory strategies attempted (with notebox);Penetration/Aspiration during swallow;Delayed swallow initiation-vallecula;Pharyngeal residue - valleculae Pharyngeal Material enters airway, remains ABOVE vocal cords and not ejected out Pharyngeal- Nectar Straw --  Pharyngeal -- Pharyngeal- Thin Teaspoon Delayed swallow initiation-vallecula;Pharyngeal residue - valleculae Pharyngeal -- Pharyngeal- Thin Cup Delayed swallow initiation-vallecula;Penetration/Aspiration during swallow;Compensatory strategies attempted (with notebox);Pharyngeal residue - valleculae Pharyngeal Material enters airway, CONTACTS cords and not ejected out Pharyngeal- Thin Straw Delayed swallow initiation-vallecula;Penetration/Aspiration during swallow Pharyngeal Material enters airway, CONTACTS cords and not ejected out Pharyngeal- Puree Delayed swallow initiation-vallecula;Pharyngeal residue - valleculae Pharyngeal Material does not enter airway Pharyngeal- Mechanical Soft Delayed swallow initiation-vallecula;Pharyngeal residue - valleculae Pharyngeal -- Pharyngeal- Regular -- Pharyngeal -- Pharyngeal- Multi-consistency -- Pharyngeal -- Pharyngeal- Pill -- Pharyngeal -- Pharyngeal Comment --  CHL IP CERVICAL ESOPHAGEAL PHASE 08/25/2015 Cervical Esophageal Phase WFL Pudding Teaspoon -- Pudding Cup -- Honey Teaspoon -- Honey Cup -- Nectar Teaspoon -- Nectar Cup -- Nectar Straw -- Thin Teaspoon -- Thin Cup -- Thin Straw -- Puree -- Mechanical Soft -- Regular -- Multi-consistency -- Pill -- Cervical Esophageal Comment -- CHL IP GO 08/16/2015 Functional Assessment Tool Used ASHA NOMS and clinical judgment.   Functional Limitations Swallowing Swallow Current Status [phone]) (None) Swallow Goal Status [id number]) (None) Swallow Discharge Status [phone]) (None) Motor Speech Current Status [phone]) (None) Motor Speech Goal Status [phone]) (None)  Motor Speech Goal Status [phone]) (None) Spoken Language Comprehension Current Status [phone]) (None) Spoken Language Comprehension Goal Status [id number]) (None) Spoken Language Comprehension Discharge Status [phone]) (None) Spoken Language Expression Current Status [phone]) (None) Spoken Language Expression Goal Status [phone]) (None) Spoken Language Expression Discharge Status [phone]) (None) Attention Current Status [id number]) (None) Attention Goal Status [id number]) (None) Attention Discharge Status [phone]) (None) Memory Current Status [id number]) (None) Memory Goal Status [id number]) (None) Memory Discharge Status [id number]) (None) Voice Current Status [id number]) (None) Voice Goal Status [id number]) (None) Voice Discharge Status [phone]) (None) Other Speech-Language Pathology Functional Limitation [phone]) (None) Other Speech-Language Pathology Functional Limitation Goal Status [id number]) (None) Other Speech-Language Pathology Functional Limitation Discharge Status [phone]) (None) PAYNE, COURTNEY 08/25/2015, 4:32 PM              Ct Angio Chest Aorta W/cm &/or Wo/cm  09/06/2015  CLINICAL DATA:  Chest pain radiating to the back beginning at 1600 hours. Similar symptoms September 04, 2015. History of polymyalgia rheumatica, diabetes, CHF. EXAM: CT ANGIOGRAPHY CHEST WITH AND WITHOUT CONTRAST TECHNIQUE: Multidetector CT imaging of the chest was performed using the standard protocol during bolus administration of intravenous contrast. Multiplanar CT image reconstructions and MIPs were obtained to evaluate the vascular anatomy. CONTRAST:  60mL OMNIPAQUE IOHEXOL 350 MG/ML SOLN COMPARISON:  CT chest July 26, 2015 and chest radiograph September 05, 2015 FINDINGS: MEDIASTINUM: The heart is mildly enlarged, similar to prior examination. Small residual pericardial effusion. Moderate coronary artery calcifications. Noncontrast CT of the thoracic aorta shows no abnormal density. Postcontrast imaging of the aorta shows homogeneous contrast opacification, normal in  course and caliber with moderate calcific atherosclerosis and and intimal thickening. No aneurysm, dissection, suspicious luminal irregularity, periaortic fluid collections or contrast extravasation. High-grade stenosis LEFT V1 segment due to atherosclerosis, patent arch vessels. Scattered prominent though not pathologically enlarged mediastinal lymph nodes measure up to 9 mm, previously 11 mm short axis. LUNGS: Tracheobronchial tree is patent, no pneumothorax. Complete resolution of pleural effusions. Dependent atelectasis and/or scarring. RIGHT upper lobe atelectasis. No focal consolidation. Interval extubation. SOFT TISSUES AND OSSEOUS STRUCTURES: Thoracolumbar PLIF. Osteopenia stable mild T5, moderate to severe to T7, moderate T8 mild T9 compression fractures. Multiple small gallstones. Colonic diverticulosis partially imaged. 13 mm exophytic RIGHT interpolar cyst. Multiple thyroid nodules measure up to 14 mm, which do not reach size criteria  for recommended follow-up. Review of the MIP images confirms the above findings. IMPRESSION: No acute vascular process. Moderate atherosclerosis and high-grade stenosis LEFT vertebral artery origin. Stable cardiomegaly, small residual pericardial effusion. Resolution of pleural effusions. No acute pulmonary process. Electronically Signed   By: Elon Alas M.D.   On: 09/06/2015 02:04   Dg Hip Unilat With Pelvis 2-3 Views Right  09/01/2015  CLINICAL DATA:  No known injury, right hip pain EXAM: DG HIP (WITH OR WITHOUT PELVIS) 2-3V RIGHT COMPARISON:  None. FINDINGS: Mild narrowing of both hips was mild right osteophyte formation. No fracture or dislocation. Pelvic bones intact. Lumbar spinal rods partially visualized. Calcification of the aortoiliac and femoral arteries bilaterally. IMPRESSION: No acute findings Electronically Signed   By: Skipper Cliche M.D.   On: 09/01/2015 11:45    Microbiology: Recent Results (from the past 240 hour(s))  Blood Culture  (routine x 2)     Status: None   Collection Time: 09/07/15  2:40 PM  Result Value Ref Range Status   Specimen Description BLOOD RIGHT HAND  Final   Special Requests BOTTLES DRAWN AEROBIC AND ANAEROBIC 5CC  Final   Culture NO GROWTH 5 DAYS  Final   Report Status 09/12/2015 FINAL  Final  Blood Culture (routine x 2)     Status: None   Collection Time: 09/07/15  2:50 PM  Result Value Ref Range Status   Specimen Description BLOOD LEFT ANTECUBITAL  Final   Special Requests BOTTLES DRAWN AEROBIC AND ANAEROBIC 5CC  Final   Culture NO GROWTH 5 DAYS  Final   Report Status 09/12/2015 FINAL  Final  Urine culture     Status: None   Collection Time: 09/07/15  5:20 PM  Result Value Ref Range Status   Specimen Description URINE, CLEAN CATCH  Final   Special Requests NONE  Final   Culture MULTIPLE SPECIES PRESENT, SUGGEST RECOLLECTION  Final   Report Status 09/09/2015 FINAL  Final     Labs: Basic Metabolic Panel:  Recent Labs Lab 09/12/15 0222 09/13/15 0248 09/13/15 1026 09/15/15 0410 09/16/15 0219  NA 141 137 139 137 135  K 3.7 3.6 3.2* 2.9* 3.5  CL 110 108 103 96* 97*  CO2 22 23 20* 27 25  GLUCOSE 189* 215* 199* 297* 273*  BUN 6 8 8 15  21*  CREATININE 1.13 1.24 1.08 1.33* 1.42*  CALCIUM 7.4* 7.2* 7.7* 7.7* 8.6*   Liver Function Tests: No results for input(s): AST, ALT, ALKPHOS, BILITOT, PROT, ALBUMIN in the last 168 hours. No results for input(s): LIPASE, AMYLASE in the last 168 hours. No results for input(s): AMMONIA in the last 168 hours. CBC:  Recent Labs Lab 09/12/15 0222 09/13/15 0248 09/15/15 0410 09/16/15 0219  WBC 8.2 8.5 7.5 8.5  HGB 9.4* 10.1* 10.6* 10.6*  HCT 30.0* 30.4* 31.8* 32.9*  MCV 95.5 95.3 93.8 93.2  PLT 305 302 307 345   Cardiac Enzymes:  Recent Labs Lab 09/09/15 1608  TROPONINI <0.03   BNP: BNP (last 3 results)  Recent Labs  08/12/15 1040 09/06/15 0343 09/12/15 0222  BNP 222.8* 339.2* 1866.7*    ProBNP (last 3 results) No results  for input(s): PROBNP in the last 8760 hours.  CBG:  Recent Labs Lab 09/15/15 1217 09/15/15 1647 09/15/15 2139 09/16/15 0631 09/16/15 1116  GLUCAP 319* 315* 278* 261* 304*       Signed:  [facility] 09/16/2015, 1:56 PM

## 2015-09-16 NOTE — Progress Notes (Signed)
Physical Therapy Treatment Patient Details Name: Curtis Frazier MRN: [id number] DOB: 20-Jul-1940 Today's Date: 09/16/2015    History of Present Illness Curtis Frazier is a 75 y.o. male with a Past Medical History ofCAD s/p Cath w/ angioplasty, DM, MI, CHF, PAF, GI bleeds - AVMs, HLD, HTN, PRM, Psoriasis, who presents with symptomatitic cholelithiasis. No direct evidence of acute cholecystitis. Stress dose steroids for hypotension due to adrenal insufficiency.  Developed chest pain, was in a-flutter w/RVR and underwent cardioversion earlier today and back to sinus rhythm.     PT Comments    Patient progressing, but still limited by SOB so education this session regarding energy conservation and importance of taking his time to do things as he has tremors and on anticoagulation could bump his arms and increase bruising/bleeding.  Appropriate for HHPT level with assist at home.  Follow Up Recommendations  Home health PT;Supervision for mobility/OOB     Equipment Recommendations  None recommended by PT    Recommendations for Other Services       Precautions / Restrictions Precautions Precautions: Back;Fall Precaution Comments: Recent spine surgery 07/15/15    Mobility  Bed Mobility         Supine to sit: Supervision        Transfers   Equipment used: Rolling walker (2 wheeled)   Sit to Stand: Supervision         General transfer comment: supervision for safety, no LOB  Ambulation/Gait Ambulation/Gait assistance: Min guard;Supervision Ambulation Distance (Feet): 150 Feet Assistive device: Rolling walker (2 wheeled) Gait Pattern/deviations: Step-through pattern;Decreased stride length;Trunk flexed     General Gait Details: cues for proximity to walker, DOE 3/4 HR max 122 back in a-fib/flutter   Stairs            Wheelchair Mobility    Modified Rankin (Stroke Patients Only)       Balance     Sitting balance-Leahy Scale: Good Sitting balance - Comments:  sat at sink to brush teeth, wash face, comb hair; all the while education on energy conservation and importance of sitting when able for activities due to SOB with activity      Standing balance-Leahy Scale: Fair Standing balance comment: standing activity without UE support                    Cognition Arousal/Alertness: Awake/alert Behavior During Therapy: WFL for tasks assessed/performed Overall Cognitive Status: Within Functional Limits for tasks assessed                      Exercises      General Comments General comments (skin integrity, edema, etc.): seen for AM session for ambulation and returned in PM to walk second time, but pt dressed and ready for d/c so assisted with ADL task in prep for d/c      Pertinent Vitals/Pain Faces Pain Scale: No hurt    Home Living                      Prior Function            PT Goals (current goals can now be found in the care plan section) Progress towards PT goals: Progressing toward goals    Frequency  Min 3X/week    PT Plan Current plan remains appropriate    Co-evaluation             End of Session Equipment Utilized During Treatment: Gait belt Activity  Tolerance: Patient tolerated treatment well Patient left: in bed (seated EOB)     Time: [id number] (and [id number]) PT Time Calculation (min) (ACUTE ONLY): 8 min  Charges:  $Gait Training: 8-22 mins $Therapeutic Activity: 8-22 mins                    G Codes:      Reginia Naas 09/29/15, 5:00 PM  [place], [place] 29-Sep-2015

## 2015-09-16 NOTE — Progress Notes (Signed)
Patient ID: REZNOR BANKA, male   DOB: Nov 26, 1940, 75 y.o.   MRN: [id number]     Patient Name: Curtis Frazier Date of Encounter: 09/16/2015  Principal Problem:   Chest pain on exertion Active Problems:   Coronary atherosclerosis   Rheumatoid arthritis (HCC)   PMR (polymyalgia rheumatica) (HCC)   BPH (benign prostatic hyperplasia)   Chronic diastolic congestive heart failure (HCC)   Symptomatic cholelithiasis   Elevated troponin   Kidney disease, chronic, stage III (GFR 30-59 ml/min)   Anemia of chronic disease   Acute posthemorrhagic anemia   Abnormal thyroid function test   Atrial flutter (HCC)   Pain in the chest   Acute on chronic diastolic HF (heart failure) (HCC)   Atrial fibrillation with RVR [facility])   Primary Cardiologist: Dr. Johnsie Cancel   Patient Profile: Curtis Frazier is a 75 year old with a past medical history of CAD, DM, CHF, PAF, GI bleed, HLD, HTN.  He presented with chest pain on 09/12/15 with minimal elevation of troponin.  In Afib, TEE/DCCV on 09/14/15  SUBJECTIVE: No pains feels well    OBJECTIVE Filed Vitals:   09/14/15 2157 09/15/15 0614 09/15/15 2039 09/16/15 0459  BP: 116/63 117/57 114/55 119/57  Pulse: 92 82 98 113  Temp: 98.9 F (37.2 C) 97.8 F (36.6 C) 98.7 F (37.1 C) 98.6 F (37 C)  TempSrc: Oral Oral Oral Oral  Resp: 20 18 18 18   Height:      Weight:      SpO2: 93% 93% 93% 91%    Intake/Output Summary (Last 24 hours) at 09/16/15 0815 Last data filed at 09/15/15 2213  Gross per 24 hour  Intake     10 ml  Output    500 ml  Net   -490 ml   Filed Weights   09/07/15 0955 09/08/15 1006  Weight: 77.565 kg (171 lb) 81.9 kg (180 lb 8.9 oz)    PHYSICAL EXAM General: Well developed, well nourished, male in no acute distress. Head: Normocephalic, atraumatic.  Neck: Supple without bruits, No JVD. Lungs:  Resp regular and unlabored, CTA. Heart: RRR, S1, S2, no S3, S4, 2/6 systolic murmur; no rub. Abdomen: Soft, non-tender, non-distended, BS + x 4.    Extremities: No clubbing, cyanosis, No edema.  Neuro: Alert and oriented X 3. Moves all extremities spontaneously. Psych: Normal affect.  LABS: CBC:  Recent Labs  09/15/15 0410 09/16/15 0219  WBC 7.5 8.5  HGB 10.6* 10.6*  HCT 31.8* 32.9*  MCV 93.8 93.2  PLT 307 345   INR:  Recent Labs  09/16/15 0219  INR [id number]*   Basic Metabolic Panel:  Recent Labs  09/15/15 0410 09/16/15 0219  NA 137 135  K 2.9* 3.5  CL 96* 97*  CO2 27 25  GLUCOSE 297* 273*  BUN 15 21*  CREATININE 1.33* 1.42*  CALCIUM 7.7* 8.6*   BNP:  B NATRIURETIC PEPTIDE  Date/Time Value Ref Range Status  09/12/2015 02:22 AM 1866.7* 0.0 - 100.0 pg/mL Final  09/06/2015 03:43 AM 339.2* 0.0 - 100.0 pg/mL Final      Current facility-administered medications:  .  0.9 %  sodium chloride infusion, , Intravenous, Continuous, Robbie Lis, MD, Last Rate: 10 mL/hr at 09/11/15 2232 .  acetaminophen (TYLENOL) tablet 650 mg, 650 mg, Oral, Q6H PRN, 650 mg at 09/15/15 0211 **OR** acetaminophen (TYLENOL) suppository 650 mg, 650 mg, Rectal, Q6H PRN, Samella Parr, NP .  amiodarone (PACERONE) tablet 200 mg, 200 mg, Oral, BID, Collier Salina  Tommy Rainwater, MD, 200 mg at 09/15/15 2212 .  collagenase (SANTYL) ointment, , Topical, Daily, Robbie Lis, MD, 1 application at [id number] .  diltiazem (CARDIZEM CD) 24 hr capsule 240 mg, 240 mg, Oral, Daily, Troy Sine, MD, 240 mg at 09/15/15 1029 .  fentaNYL (SUBLIMAZE) injection 25-50 mcg, 25-50 mcg, Intravenous, Q5 min PRN, Myrtie Soman, MD .  glipiZIDE (GLUCOTROL) tablet 5 mg, 5 mg, Oral, QAC breakfast, Robbie Lis, MD, 5 mg at 09/16/15 0650 .  insulin aspart (novoLOG) injection 0-9 Units, 0-9 Units, Subcutaneous, TID WC, Robbie Lis, MD, 5 Units at 09/16/15 [phone] .  nitroGLYCERIN (NITROSTAT) SL tablet 0.4 mg, 0.4 mg, Sublingual, Once, Jeryl Columbia, NP, 0.4 mg at 09/09/15 0433 .  ondansetron (ZOFRAN) tablet 4 mg, 4 mg, Oral, Q6H PRN **OR** ondansetron (ZOFRAN) injection 4  mg, 4 mg, Intravenous, Q6H PRN, Samella Parr, NP .  pantoprazole (PROTONIX) EC tablet 40 mg, 40 mg, Oral, Daily, Samella Parr, NP, 40 mg at 09/15/15 1030 .  predniSONE (DELTASONE) tablet 10 mg, 10 mg, Oral, Q breakfast, Robbie Lis, MD, 10 mg at 09/16/15 0650 .  senna-docusate (Senokot-S) tablet 2 tablet, 2 tablet, Oral, BID, Robbie Lis, MD, 2 tablet at 09/15/15 2212 .  sodium chloride flush (NS) 0.9 % injection 3 mL, 3 mL, Intravenous, Q12H, Samella Parr, NP, 3 mL at 09/15/15 2213 .  thiamine (VITAMIN B-1) tablet 100 mg, 100 mg, Oral, Daily, Samella Parr, NP, 100 mg at 09/15/15 1030 .  warfarin (COUMADIN) tablet 2.5 mg, 2.5 mg, Oral, ONCE-1800, Kris Mouton, [facility] .  Warfarin - Pharmacist Dosing Inpatient, , Does not apply, q1800, Karren Cobble, RPH, Stopped at 09/13/15 1800 . sodium chloride 10 mL/hr at 09/11/15 2232    TELE:  ? Recurrent flutter    Radiology/Studies: Echo 09/06/15: - Left ventricle: The cavity size was normal. Wall thickness was  increased in a pattern of mild LVH. Systolic function was normal.  The estimated ejection fraction was in the range of 55% to 60%.  Wall motion was normal; there were no regional wall motion  abnormalities. Doppler parameters are consistent with abnormal  left ventricular relaxation (grade 1 diastolic dysfunction).  Doppler parameters are consistent with elevated ventricular  end-diastolic filling pressure. - Aortic valve: There was moderate stenosis. Valve area (VTI): 1.5  cm^2. Valve area (Vmax): 1.25 cm^2. Valve area (Vmean): 1.14  cm^2. - Left atrium: The atrium was mildly dilated. - Atrial septum: No defect or patent foramen ovale was identified. - Pulmonary arteries: PA peak pressure: 31 mm Hg (S).  Current Medications:  . amiodarone  200 mg Oral BID  . collagenase   Topical Daily  . diltiazem  240 mg Oral Daily  . glipiZIDE  5 mg Oral QAC breakfast  . insulin aspart  0-9 Units Subcutaneous TID WC    . nitroGLYCERIN  0.4 mg Sublingual Once  . pantoprazole  40 mg Oral Daily  . predniSONE  10 mg Oral Q breakfast  . senna-docusate  2 tablet Oral BID  . sodium chloride flush  3 mL Intravenous Q12H  . thiamine  100 mg Oral Daily  . warfarin  2.5 mg Oral ONCE-1800  . Warfarin - Pharmacist Dosing Inpatient   Does not apply q1800   . sodium chloride 10 mL/hr at 09/11/15 2232    ASSESSMENT AND PLAN: Principal Problem:   Chest pain on exertion Active Problems:   Coronary atherosclerosis   Rheumatoid  arthritis (HCC)   PMR (polymyalgia rheumatica) (HCC)   BPH (benign prostatic hyperplasia)   Chronic diastolic congestive heart failure (HCC)   Symptomatic cholelithiasis   Elevated troponin   Kidney disease, chronic, stage III (GFR 30-59 ml/min)   Anemia of chronic disease   Acute posthemorrhagic anemia   Abnormal thyroid function test   Atrial flutter (HCC)   Pain in the chest   Acute on chronic diastolic HF (heart failure) (HCC)   Atrial fibrillation with RVR ([place])   1. Atrial flutter: s/p TEE/DCCV , successful conversion to NSR. He is on Coumadin.  This patients CHA2DS2-VASc Score is at least 4. Above score calculated as 1 point each if present [CHF, HTN, DM, Vascular=MI/PAD/Aortic Plaque, Age if 65-74, or Male], 2 points each if present [Age > 75, or Stroke/TIA/TE].  Appears to be back in flutter will check ECG  Increase amiodarone and cardizem.    2. Acute on chronic diastolic CHF: negative [id number] for admission. Echo report above, moderate AS. Denies SOB. On Furosemide 20mg  IV BID, has diuresed well.  AV synchrony will help.  Lasix decreased to 20 mg daily for elevation in Cr  Will f/u on ECG  Ok to d/c home if back in flutter will continue amiodarone load as outpatient and consider repeat [facility] in 3-4 weeks     Jenkins Rouge

## 2015-09-16 NOTE — Care Management Important Message (Signed)
Important Message  Patient Details  Name: Curtis Frazier MRN: [id number] Date of Birth: 07-07-1940   Medicare Important Message Given:  Yes    Eran Mistry Abena 09/16/2015, 11:11 AM

## 2015-09-17 ENCOUNTER — Encounter: Payer: Self-pay | Admitting: *Deleted

## 2015-09-17 ENCOUNTER — Other Ambulatory Visit: Payer: Self-pay | Admitting: *Deleted

## 2015-09-17 ENCOUNTER — Telehealth: Payer: Self-pay

## 2015-09-17 NOTE — Telephone Encounter (Signed)
1st TCM - attempt unsuccessful. LVMM with contact info on mobile. Busy signal on home phone.

## 2015-09-17 NOTE — Patient Outreach (Signed)
Curtis Frazier [facility]) Care Management  09/17/2015  [place] 1940-12-28 [id number]   Initial transition of care  RNCM placed call to patient as part of the transition of care program, spoke with CurtisCurtis Frazier, HIPPA verified she report that patient is resting at this time he was up earlier and went back to bed to rest. CurtisFrazier reports patient tolerating ambulation in home using a rolling walker,she denies that patient has complaints of chest pain or shortness of breath this am.  When  I started asking  questions regarding transition program, CurtisPann stated that her daughter helps keep everything organizer, she stated she would have her call me.  I received telephone call from Curtis Frazier, daughter that is listed on [facility] consent, completed transition of care form, HIPAA verified. Curtis Frazier states she has filled patient pill organizer with medications on discharge instructions,and is wife is able to assist him with medications daily,  she denies any questions. Daughter will call to arrange post hospital visit with Dr.Tower and states she will assist with transportation. Daughter has spoken with Curtis Frazier home health on today, initial visit date not set yet,  Patient's  blood sugar this am 190, Patient does not have scales to weigh on, daughter will assist obtaining scales and encouraged to begin weighing daily.  Curtis Frazier request information on Diabetes and diet at initial visit.  Plan RNCM will visit patient for initial home visit within 1 week. Caregivers will notify PCP if worsening symptoms or 911 for emergency.  THN CM Care Plan Problem One        Most Recent Value   Care Plan Problem One  Recent Hospitalization with Atrial fibrillation   Role Documenting the Problem One  Care Management Coordinator   Care Plan for Problem One  Active   THN Long Term Goal (31-90 days)  Patient will not readmit to hospital in the next 31 days   THN Long Term Goal Start Date  09/17/15   Interventions for Problem One Long Term Goal  Educated on the transiton of care, provided contatct number, explained weekly phone calls, home viist, reinforced attending PCP appointment, taking medication as prescribed and notifying MD sooner with new symptoms or concerns   THN CM Short Term Goal #1 (0-30 days)  Patient will attend PCP appointment in the next 7 days   THN CM Short Term Goal #1 Start Date  09/17/15   Interventions for Short Term Goal #1  Educated on importance of attending post hospital visit , for medication reconcilation , verified that patient daughter will make appoiintment , provide transportaion and attend visit with patient.   THN CM Short Term Goal #2 (0-30 days)  Patient/caregiver  will purchase scales and begin to weigh daily and record in the next 30 days.   THN CM Short Term Goal #2 Start Date  09/17/15   Interventions for Short Term Goal #2  Educated on importance of tracking small increments in weight gain,weigh daily , in am same time same amount of clothing , notify MD of weight gain greater than 3 pounds in a day and 5 pounds in a week.     [name], RN, [facility] [phone]- Mobile [phone]- Toll Free Main Office

## 2015-09-20 ENCOUNTER — Telehealth: Payer: Self-pay

## 2015-09-20 NOTE — Telephone Encounter (Signed)
Agree to as written.

## 2015-09-20 NOTE — Telephone Encounter (Signed)
[name] PT with [facility] left v/m requesting verbal orders for home health PT orders 2 x a week for 4 weeks. Dr Glori Bickers out of office.

## 2015-09-20 NOTE — Telephone Encounter (Signed)
Verbal order given to [facility] with [facility] as instructed by Dr. Lorelei Pont.

## 2015-09-20 NOTE — Telephone Encounter (Signed)
Agree, as written.

## 2015-09-20 NOTE — Telephone Encounter (Signed)
2nd TCM call  Transition Care Management Follow-up Telephone Call     Date discharged? 09/16/2015        How have you been since you were released from the hospital? Stable   Any patient concerns? Coughs are not productive    Do you understand why you were in the hospital? Yes   Do you understand the discharge instructions? Yes   Where were you discharged to? Home   Items Reviewed:  Medications reviewed: No, daughter manages medications  Allergies reviewed: No, daughter manages medications  Dietary changes reviewed: Yes  Referrals reviewed: N/A   Functional Questionnaire:  Independent - I Dependent - D    Activities of Daily Living (ADLs):  Independent with ADLs; assistance requested as needed  Personal hygiene  Dressing Eating  Maintaining continence Transferring  Independent Activities of Daily Living (ADLs): Basic communication skills - I Transportation - D Meal preparation -D  Shopping- D Housework -D  Managing medications - D Managing personal finances - D   Confirmed importance and date/time of follow-up visits scheduled YES  Provider Appointment booked with PCP 09/22/15 @ 2:45 with Allie Bossier, NP  Confirmed with patient if condition begins to worsen call PCP or go to the ER.  Patient was given the office number and encouraged to call back with question or concerns: YES

## 2015-09-20 NOTE — Telephone Encounter (Signed)
[name] nurse with [facility] left v/m requesting verbal orders for home health nursing 2 x a week for 3 weeks for cardiopulmonary training related to CHF; afib and coumadin mgt and medication teaching. INR will be checked on 09/23/15.

## 2015-09-22 ENCOUNTER — Other Ambulatory Visit: Payer: Self-pay | Admitting: Primary Care

## 2015-09-22 ENCOUNTER — Ambulatory Visit (INDEPENDENT_AMBULATORY_CARE_PROVIDER_SITE_OTHER)
Admission: RE | Admit: 2015-09-22 | Discharge: 2015-09-22 | Disposition: A | Discharge status: Home / Self Care | Payer: Medicare Other | Source: Ambulatory Visit | Attending: Primary Care | Admitting: Primary Care

## 2015-09-22 ENCOUNTER — Encounter: Payer: Self-pay | Admitting: Primary Care

## 2015-09-22 ENCOUNTER — Ambulatory Visit (INDEPENDENT_AMBULATORY_CARE_PROVIDER_SITE_OTHER): Payer: Medicare Other | Admitting: Primary Care

## 2015-09-22 VITALS — BP 116/64 | HR 111 | Temp 97.5°F | Wt 169.4 lb

## 2015-09-22 DIAGNOSIS — R944 Abnormal results of kidney function studies: Secondary | ICD-10-CM

## 2015-09-22 DIAGNOSIS — R059 Cough, unspecified: Secondary | ICD-10-CM

## 2015-09-22 DIAGNOSIS — R05 Cough: Secondary | ICD-10-CM

## 2015-09-22 DIAGNOSIS — E119 Type 2 diabetes mellitus without complications: Secondary | ICD-10-CM

## 2015-09-22 MED ORDER — ALBUTEROL SULFATE HFA 108 (90 BASE) MCG/ACT IN AERS: 2.0000 | INHALATION_SPRAY | Freq: Four times a day (QID) | RESPIRATORY_TRACT | Status: DC | PRN

## 2015-09-22 MED ORDER — GLIPIZIDE 10 MG PO TABS: 10.0000 mg | ORAL_TABLET | Freq: Two times a day (BID) | ORAL | Status: DC

## 2015-09-22 NOTE — Patient Instructions (Addendum)
Complete lab work prior to leaving today. I will notify you of your results once received.   Complete xray(s) prior to leaving today. I will notify you of your results once received.  Come for a Coumadin check Monday next week as scheduled.  Follow up with Cardiology as discussed.  Please have the home health nurse look at your toe nails.  We have increased your Glipizide from 5 mg to 10 mg. Take 1 tablet by mouth twice daily with meals.  Continue to keep log of your blood sugars and report any low numbers below 80.  Please call in your sugar readings to Dr. Glori Bickers in 2 weeks.  Follow up with Dr. Glori Bickers in late June for recheck of your A1C.  It was a pleasure meeting you!  Diabetes Mellitus and Food It is important for you to manage your blood sugar (glucose) level. Your blood glucose level can be greatly affected by what you eat. Eating healthier foods in the appropriate amounts throughout the day at about the same time each day will help you control your blood glucose level. It can also help slow or prevent worsening of your diabetes mellitus. Healthy eating may even help you improve the level of your blood pressure and reach or maintain a healthy weight.  General recommendations for healthful eating and cooking habits include:  Eating meals and snacks regularly. Avoid going long periods of time without eating to lose weight.  Eating a diet that consists mainly of plant-based foods, such as fruits, vegetables, nuts, legumes, and whole grains.  Using low-heat cooking methods, such as baking, instead of high-heat cooking methods, such as deep frying. Work with your dietitian to make sure you understand how to use the Nutrition Facts information on food labels. HOW CAN FOOD AFFECT ME? Carbohydrates Carbohydrates affect your blood glucose level more than any other type of food. Your dietitian will help you determine how many carbohydrates to eat at each meal and teach you how to count  carbohydrates. Counting carbohydrates is important to keep your blood glucose at a healthy level, especially if you are using insulin or taking certain medicines for diabetes mellitus. Alcohol Alcohol can cause sudden decreases in blood glucose (hypoglycemia), especially if you use insulin or take certain medicines for diabetes mellitus. Hypoglycemia can be a life-threatening condition. Symptoms of hypoglycemia (sleepiness, dizziness, and disorientation) are similar to symptoms of having too much alcohol.  If your health care provider has given you approval to drink alcohol, do so in moderation and use the following guidelines:  Women should not have more than one drink per day, and men should not have more than two drinks per day. One drink is equal to:  12 oz of beer.  5 oz of wine.  1 oz of hard liquor.  Do not drink on an empty stomach.  Keep yourself hydrated. Have water, diet soda, or unsweetened iced tea.  Regular soda, juice, and other mixers might contain a lot of carbohydrates and should be counted. WHAT FOODS ARE NOT RECOMMENDED? As you make food choices, it is important to remember that all foods are not the same. Some foods have fewer nutrients per serving than other foods, even though they might have the same number of calories or carbohydrates. It is difficult to get your body what it needs when you eat foods with fewer nutrients. Examples of foods that you should avoid that are high in calories and carbohydrates but low in nutrients include:  Trans fats (most processed  foods list trans fats on the Nutrition Facts label).  Regular soda.  Juice.  Candy.  Sweets, such as cake, pie, doughnuts, and cookies.  Fried foods. WHAT FOODS CAN I EAT? Eat nutrient-rich foods, which will nourish your body and keep you healthy. The food you should eat also will depend on several factors, including:  The calories you need.  The medicines you take.  Your weight.  Your blood  glucose level.  Your blood pressure level.  Your cholesterol level. You should eat a variety of foods, including:  Protein.  Lean cuts of meat.  Proteins low in saturated fats, such as fish, egg whites, and beans. Avoid processed meats.  Fruits and vegetables.  Fruits and vegetables that may help control blood glucose levels, such as apples, mangoes, and yams.  Dairy products.  Choose fat-free or low-fat dairy products, such as milk, yogurt, and cheese.  Grains, bread, pasta, and rice.  Choose whole grain products, such as multigrain bread, whole oats, and brown rice. These foods may help control blood pressure.  Fats.  Foods containing healthful fats, such as nuts, avocado, olive oil, canola oil, and fish. DOES EVERYONE WITH DIABETES MELLITUS HAVE THE SAME MEAL PLAN? Because every person with diabetes mellitus is different, there is not one meal plan that works for everyone. It is very important that you meet with a dietitian who will help you create a meal plan that is just right for you.   This information is not intended to replace advice given to you by your health care provider. Make sure you discuss any questions you have with your health care provider.   Document Released: 02/23/2005 Document Revised: 06/19/2014 Document Reviewed: 04/25/2013 Elsevier Interactive Patient Education [employer].

## 2015-09-22 NOTE — Progress Notes (Signed)
Subjective:    Patient ID: Curtis Frazier, male    DOB: [birth year], 75 y.o.   MRN: [id number]  HPI  Curtis Frazier is a 75 year old male who presents today for transitional care hospital follow up. Frequent hospitalizations dating back to early February 2017 with confusion s/p lumbar laminectomy. He had numerous complications including ventilator-dependant respiratory failure, acute diastolic heart exacerbation, and atrial fibrillation with RVR that required cardioversion.  He was admitted to inpatient rehab and was eventually discharged home on 09/02/15.   He was re-admitted to the hospital on 03/27 for complaints of chest pain that was determined not to be cardiac cause. He was discharged that same date.  His most recent admission began on 09/07/15 as he presented to the emergency department with complaints of epigastric chest pain with radiation through to his posterior chest. He had not had his prednisone (history of polymyalgia rheumatica) since his prior discharge on 03/23.   During his most recent hospitalization he underwent testing and was noted to have midly elevated troponin levels (which had normalized prior to discharge), 2 D echo with EF of 55% and grade 1 diastolic dysfunction. He had several episodes of atrial fibrillation and underwent TEE and cardioversion as a surgical intervention on 04/04. He was initiated on Cardizem that is up to 240 mg, Coumadin, and Amiodarone that was increased to 400 mg BID per cards.  He also experienced RUQ abdominal pain for which he underwent US abdominal ultrasound that revealed cholelithiasis without sonographic finds for acute cholecystitis. HIDA scan unremarkable.   He also experienced spikes in his blood sugars during hospitalization. He is currently managed on Glipizide 5 mg BID. His A1C was checked on 03/28 and was 6.7. He's checking his sugars twice daily at home and is getting readings of 300's-400's on average. His hospital sugars ran 250's-300's and  was provided with insulin for management. He was discharged home on 04/06.  Since discharge home he's feeling fatigued, but overall better. He has an appointment with his Cardiologist on 10/07/15. His heart has been out of rhythm (atrial flutter) since prior to discharge and is currently out of rhythm today. His surgical cardioversion was temporary. He endorses compliance to his Cardizem and Amiodarone.   Since discharge home he's also experienced chest congestion that had been present for the past 2 weeks. He underwent numerous chest xrays in the hospital. The last chest xray was on 03/30 and was questionable for pneumonia. He's continued to cough, sometimes productive (brownish color), he's run low grade fevers on Sunday and then again today. He's not taken anything OTC for his cough.   He is to start with home health later this week, and then home PT. His son is present today from [place] and is helping his father to get acclimated post discharge.    Review of Systems  Constitutional: Positive for fever, chills and fatigue.  HENT: Positive for congestion.   Respiratory: Positive for cough and shortness of breath.   Cardiovascular: Negative for chest pain.  Gastrointestinal: Negative for abdominal pain.  Genitourinary: Negative for dysuria and frequency.  Musculoskeletal: Negative for back pain.  Neurological: Positive for weakness. Negative for dizziness.       Past Medical History  Diagnosis Date  . Other and unspecified hyperlipidemia   . Coronary atherosclerosis of unspecified type of vessel, native or graft   . Personal history of colonic polyps   . Actinic keratosis   . Psoriatic arthropathy ([place])   . Other psoriasis   .  Scoliosis (and kyphoscoliosis), idiopathic   . GI bleed 12/10    cecam AVM  . Gastritis 12/10  . Hyperlipidemia     takes Pravastatin daily  . Hypertension     takes Metoprolol daily  . Shortness of breath     with exertion  . Headache(784.0)      occasionally  . Joint pain   . Chronic back pain     scoliosis/stenosis/radiculopathy,degenerative disc disease  . Psoriasis   . Bruises easily   . Esophageal reflux     takes Omeprazole daily  . History of colonic polyps   . Hemorrhoids   . Kidney stone     hx of  . Blood transfusion 2010  . PMR (polymyalgia rheumatica) ([facility]) 01/20/2012    tx by specialist on a long prednisone taper  . S/P PTCA (percutaneous transluminal coronary angioplasty)   . Rheumatoid arthritis(714.0)     takes Methotrexate 7pills weekly  . Degeneration of intervertebral disc, site unspecified   . Myocardial infarction ([place]) 2010  . Pneumonia 2008; 07/2015  . Type II or unspecified type diabetes mellitus without mention of complication, not stated as uncontrolled   . Skin cancer     "arms"  . CHF (congestive heart failure) ([place])      Social History   Social History  . Marital Status: Married    Spouse Name: N/A  . Number of Children: N/A  . Years of Education: N/A   Occupational History  . Not on file.   Social History Main Topics  . Smoking status: Former Smoker -- 1.50 packs/day for 53 years    Types: Cigarettes  . Smokeless tobacco: Never Used     Comment: quit smoking cigarettes in 2010  . Alcohol Use: 0.0 oz/week    0 Standard drinks or equivalent per week     Comment: 08/11/2015 "might have 1-2 margaritas/month in the summertime"  . Drug Use: No  . Sexual Activity: No   Other Topics Concern  . Not on file   Social History Narrative    Past Surgical History  Procedure Laterality Date  . Rotator cuff repair Left 2000  . Colonoscopy    . Vasectomy  1979  . Coronary angioplasty with stent placement  02/2009    has one stent  . Carotid doppler  10/12    0-39% R and 60-79% left   . Cataract extraction  2012  . Cardiac catheterization  2010  . Lithotripsy  2009  . Tee without cardioversion N/A 08/06/2015    Procedure: TRANSESOPHAGEAL ECHOCARDIOGRAM (TEE);  Surgeon: Skeet Latch, MD;  Location: [facility];  Service: Cardiovascular;  Laterality: N/A;  . Cardioversion N/A 08/06/2015    Procedure: CARDIOVERSION;  Surgeon: Skeet Latch, MD;  Location: [facility] ENDOSCOPY;  Service: Cardiovascular;  Laterality: N/A;  . Back surgery    . Lumbar laminectomy  07/2015  . Posterior laminectomy thoracic spine      "fixed the discs"  . Skin cancer excision      "arms"  . Tee without cardioversion N/A 09/14/2015    Procedure: TRANSESOPHAGEAL ECHOCARDIOGRAM (TEE);  Surgeon: Jerline Pain, MD;  Location: [facility];  Service: Cardiovascular;  Laterality: N/A;  . Cardioversion N/A 09/14/2015    Procedure: CARDIOVERSION;  Surgeon: Jerline Pain, MD;  Location: [facility] ENDOSCOPY;  Service: Cardiovascular;  Laterality: N/A;    Family History  Problem Relation Age of Onset  . Coronary artery disease Mother   . Diabetes Mother   . CAD Mother   .  Coronary artery disease Sister   . Hypertension Sister   . Kidney failure Sister   . CAD Sister   . Diabetes Brother   . Prostate cancer Brother   . Pancreatic cancer Brother   . Diabetes Brother   . Anesthesia problems Neg Hx   . Hypotension Neg Hx   . Malignant hyperthermia Neg Hx   . Pseudochol deficiency Neg Hx     Allergies  Allergen Reactions  . Ace Inhibitors Other (See Comments)    Causes high K  . Angiotensin Receptor Blockers Other (See Comments)    Causes high K  . Metoprolol Other (See Comments)    May cause elevated K level  . Penicillins Swelling and Rash    Has patient had a PCN reaction causing immediate rash, facial/tongue/throat swelling, SOB or lightheadedness with hypotension: YES Has patient had a PCN reaction causing severe rash involving mucus membranes or skin necrosis: NO Has patient had a PCN reaction that required hospitalization NO Has patient had a PCN reaction occurring within the last 10 years: NO If all of the above answers are "NO", then may proceed with Cephalosporin use.  . Tetracycline  Swelling and Rash    Current Outpatient Prescriptions on File Prior to Visit  Medication Sig Dispense Refill  . acetaminophen (TYLENOL) 325 MG tablet Take 650 mg by mouth every 6 (six) hours as needed (Pain scale of 1-4 or temp > 101).    Marland Kitchen amiodarone (PACERONE) 400 MG tablet Take 1 tablet (400 mg total) by mouth 2 (two) times daily. 60 tablet 0  . calcium-vitamin D (OSCAL WITH D) 500-200 MG-UNIT tablet Take 1 tablet by mouth daily with breakfast.    . diltiazem (CARDIZEM CD) 240 MG 24 hr capsule Take 1 capsule (240 mg total) by mouth daily. 30 capsule 0  . ferrous sulfate 325 (65 FE) MG tablet Take 1 tablet (325 mg total) by mouth 3 (three) times daily with meals. 90 tablet 0  . furosemide (LASIX) 20 MG tablet Take 1 tablet (20 mg total) by mouth daily. 30 tablet 0  . pantoprazole (PROTONIX) 40 MG tablet Take 1 tablet (40 mg total) by mouth daily. 30 tablet 0  . predniSONE (DELTASONE) 10 MG tablet Take 1 tablet (10 mg total) by mouth daily with breakfast. For 1 week then decrease to 5 mg daily 20 tablet 0  . thiamine 100 MG tablet Take 1 tablet (100 mg total) by mouth daily. 30 tablet 0  . warfarin (COUMADIN) 2.5 MG tablet Take 1 tablet (2.5 mg total) by mouth daily at 6 PM. 15 tablet 0  . docusate sodium (COLACE) 100 MG capsule Take 100 mg by mouth 2 (two) times daily. Reported on 09/22/2015     No current facility-administered medications on file prior to visit.    BP 116/64 mmHg  Pulse 111  Temp(Src) 97.5 F (36.4 C) (Oral)  Wt 169 lb 6.4 oz (76.839 kg)  SpO2 95%    Objective:   Physical Exam  Constitutional: He appears well-nourished. He does not appear ill.  HENT:  Right Ear: Tympanic membrane and ear canal normal.  Left Ear: Tympanic membrane and ear canal normal.  Nose: Right sinus exhibits no maxillary sinus tenderness and no frontal sinus tenderness. Left sinus exhibits no maxillary sinus tenderness and no frontal sinus tenderness.  Mouth/Throat: Oropharynx is clear and  moist.  Neck: Neck supple.  Cardiovascular: Normal rate.  An irregularly irregular rhythm present.  Pulses:      Dorsalis  pedis pulses are 2+ on the right side, and 2+ on the left side.       Posterior tibial pulses are 2+ on the right side, and 2+ on the left side.  Mild edema to right lower ankle, no pitting.  Pulmonary/Chest: Effort normal. He has no wheezes.  Questionable decreased lung sounds to right base. No wheezing or rhonchi. Difficulty to auscultate.   Lymphadenopathy:    He has no cervical adenopathy.  Skin: Skin is warm and dry.  Psychiatric: He has a normal mood and affect.          Assessment & Plan:  Transitional Hospital Follow Up:  Numerous admissions since February 2017. Most recent dating from 03/28-04/06. Overall feeling better, some fatigue and persistent cough continues since prior to discharge.  Exam with questionable lung sounds that were difficult to auscultate.  Labs upon discharge with with elevated creatine and chest xray with questionable pneumonia. Will repeat BMP, CBC, and chest xray day as he continues to cough and is fatigued.  Follow up with Cards scheduled for 04/27.  Will get him connected with our coumadin clinic as his last INR on 04/06 was stable, but will need management post discharge.   Increased Glipizide to 10 mg BID, will likely need long acting insulin as he's back on prednisone, but will have him continue to monitor sugars by keeping a log and follow up with PCP.  He is to be visited by home health and PT later this week (Thursday/Friday).  Close follow up with PCP recommended. Answered all questions by both patient and his son.   All hospital labs, imaging, and notes reviewed.

## 2015-09-23 ENCOUNTER — Telehealth: Payer: Self-pay | Admitting: Radiology

## 2015-09-23 ENCOUNTER — Telehealth: Payer: Self-pay

## 2015-09-23 LAB — CBC WITH DIFFERENTIAL/PLATELET
BASOS ABS: 0.1 10*3/uL (ref 0.0–0.1)
Basophils Relative: 0.5 % (ref 0.0–3.0)
EOS ABS: 0 10*3/uL (ref 0.0–0.7)
Eosinophils Relative: 0 % (ref 0.0–5.0)
HEMATOCRIT: 35.2 % — AB (ref 39.0–52.0)
HEMOGLOBIN: 11.6 g/dL — AB (ref 13.0–17.0)
LYMPHS PCT: 7.8 % — AB (ref 12.0–46.0)
Lymphs Abs: 1.5 10*3/uL (ref 0.7–4.0)
MCHC: 33 g/dL (ref 30.0–36.0)
MCV: 93.4 fl (ref 78.0–100.0)
MONO ABS: 1 10*3/uL (ref 0.1–1.0)
Monocytes Relative: 5.2 % (ref 3.0–12.0)
Neutro Abs: 16.7 10*3/uL — ABNORMAL HIGH (ref 1.4–7.7)
Neutrophils Relative %: 86.5 % — ABNORMAL HIGH (ref 43.0–77.0)
PLATELETS: 371 10*3/uL (ref 150.0–400.0)
RBC: 3.76 Mil/uL — ABNORMAL LOW (ref 4.22–5.81)
RDW: 17.5 % — ABNORMAL HIGH (ref 11.5–15.5)

## 2015-09-23 LAB — BASIC METABOLIC PANEL
BUN: 27 mg/dL — ABNORMAL HIGH (ref 6–23)
CALCIUM: 8.8 mg/dL (ref 8.4–10.5)
CO2: 24 mEq/L (ref 19–32)
CREATININE: 2.41 mg/dL — AB (ref 0.40–1.50)
Chloride: 96 mEq/L (ref 96–112)
GFR: 27.97 mL/min — ABNORMAL LOW (ref >60.00)
Glucose, Bld: 424 mg/dL — ABNORMAL HIGH (ref 70–99)
Potassium: 4.9 mEq/L (ref 3.5–5.1)
Sodium: 131 mEq/L — ABNORMAL LOW (ref 135–145)

## 2015-09-23 NOTE — Telephone Encounter (Signed)
Approved and agree.

## 2015-09-23 NOTE — Telephone Encounter (Signed)
Will touch base with Anda Kraft who saw him yesterday. Cr also markedly elevated.uched

## 2015-09-23 NOTE — Telephone Encounter (Signed)
[name] from [place] called and stated that she checked his INR.  INR  1.7  Please advise. [name] stated that she would like to know if she needs to do anything since patient going to ED.  Lisa at [phone]

## 2015-09-23 NOTE — Telephone Encounter (Signed)
Doreen PT with [facility] left v/m requesting verbal orders for home health PT 2 x a week for 4 weeks. Dr Glori Bickers out of office.

## 2015-09-23 NOTE — Telephone Encounter (Signed)
Called Curtis Frazier and notifed him of the verbal order for home health PT.

## 2015-09-23 NOTE — Telephone Encounter (Signed)
Noted and agree with hospitalization and coumadin dosing. As of yet, no evidence of hospitalization within the [facility] system.

## 2015-09-23 NOTE — Telephone Encounter (Signed)
Called and notified patient of Dr Gutierrez's comments. Patient then gave the phone to home health nurse and notified her of the comments as well. Inform him patient needs to go to the ER.

## 2015-09-23 NOTE — Telephone Encounter (Signed)
Curtis Frazier called back requesting instructions for Coumadin.  Patient is currently taking 2.5 mg daily.  Instructions given for patient to take 5 mg today, then continue to take 2 mg every day.  Will recheck as scheduled in our Coumadin clinic on 4/17 if he is not admitted.

## 2015-09-23 NOTE — Telephone Encounter (Signed)
[facility] lab called a critical WBC - 19.3. Results given to Dr Danise Mina

## 2015-09-23 NOTE — Telephone Encounter (Signed)
Touched base with Anda Kraft. Recommend return to ER. Kidney function is much worse, white count concerning for return of infection possibly pneumonia.  Likely needs more treatment than we can provide outpatient.

## 2015-09-24 ENCOUNTER — Ambulatory Visit: Payer: Self-pay | Admitting: *Deleted

## 2015-09-24 ENCOUNTER — Other Ambulatory Visit: Payer: Self-pay | Admitting: *Deleted

## 2015-09-24 DIAGNOSIS — E119 Type 2 diabetes mellitus without complications: Secondary | ICD-10-CM | POA: Insufficient documentation

## 2015-09-24 DIAGNOSIS — R946 Abnormal results of thyroid function studies: Secondary | ICD-10-CM | POA: Insufficient documentation

## 2015-09-24 DIAGNOSIS — Z9189 Other specified personal risk factors, not elsewhere classified: Secondary | ICD-10-CM | POA: Insufficient documentation

## 2015-09-24 DIAGNOSIS — I5033 Acute on chronic diastolic (congestive) heart failure: Secondary | ICD-10-CM | POA: Insufficient documentation

## 2015-09-24 DIAGNOSIS — S37009A Unspecified injury of unspecified kidney, initial encounter: Secondary | ICD-10-CM | POA: Insufficient documentation

## 2015-09-24 NOTE — Patient Outreach (Signed)
[facility]) Care Management  09/24/2015  Curtis Frazier [birth year] [id number]   Transition of care week #2  Telephone call to home of [name] prior to scheduled home visit this am, spoke with his wife , [name] HIPAA verified, She states patient was taken  to [facility] on last evening and is now admitted.  Plan Will follow progress, will discuss with care management coverage Tomasa Rand RN , to contact patient at discharge for transition of care.  [name], RN, [place] Management [phone]- Mobile [phone]- Toll Free Main Office

## 2015-09-27 ENCOUNTER — Telehealth: Payer: Self-pay

## 2015-09-27 ENCOUNTER — Other Ambulatory Visit: Payer: Self-pay

## 2015-09-27 ENCOUNTER — Ambulatory Visit: Payer: Medicare Other

## 2015-09-27 NOTE — Patient Outreach (Signed)
Transition of care call:   Placed call to patient and explained I was covering for his primary nurse Landis Martins.  Patient reports that he was discharged from [facility] on 09/25/2015. Reports that he lab work is better. Reports that he feels weak and washed out. States that his daughter is taking care of him and is managing his medications.  Daughter reports no changes in medications but states that she clarified Amiarodone orders and patient is supposed to be taking 200 mg twice a day.  Daughter reports that she has filled patients pill box. Reports that patient had a low grade fever today of 99.5. Patient took tylenol and now does not have a fever.  Daughter reports that she thought home health nurse was coming out to see patient today but no one showed up and called.   Follow up: Patient will see orthopedist tomorrow for surgery follow up. Daughter to transport to appointment.                  Patient has follow up planned with primary Md for hospital follow up and labs on 09/30/2015.  Reviewed chart and discovered that MD confirmed resumption orders and therefore daughter informed to expect a call from [place] for nurse and physical therapy. Daughter concerns about patients heart rate. Explained to daughter she could purchase a pulse ox to have at home to assist with monitoring pulse. Daughter reports that she will purchase one tonight.    PLAN: Patient will remain active in the transition of care program with weekly outreaches. Provided my contact information for any needs that arise this week. Reviewed that Landis Martins RN would follow up next week.   THN CM Care Plan Problem One        Most Recent Value   Care Plan Problem One  Recent Hospitalization with Atrial fibrillation   Role Documenting the Problem One  Care Management Coordinator   Care Plan for Problem One  Active   THN Long Term Goal (31-90 days)  Patient will not readmit to hospital in the next 31 days   THN Long Term Goal Start Date   09/27/15 [name] restarted due to admission at [facility] on 4/13-4/15]   Interventions for Problem One Long Term Goal   Reinforced [facility] program. Educated on the transiton of care, provided contatct number, explained weekly phone calls, home viist, reinforced attending PCP appointment, taking medication as prescribed and notifying MD sooner with new symptoms or concerns   THN CM Short Term Goal #1 (0-30 days)  Patient will attend PCP appointment in the next 7 days   THN CM Short Term Goal #1 Start Date  09/27/15 [name] restarted. ]   Interventions for Short Term Goal #1  Reviewed appointment time and date with daughter via phone.   THN CM Short Term Goal #2 (0-30 days)  Patient/caregiver  will purchase scales and begin to weigh daily and record in the next 30 days.   THN CM Short Term Goal #2 Start Date  09/27/15 [name] restarted due to admission]   Interventions for Short Term Goal #2  Educated on importance of tracking small increments in weight gain,weigh daily , in am same time same amount of clothing , notify MD of weight gain greater than 3 pounds in a day and 5 pounds in a week.   THN CM Short Term Goal #3 (0-30 days)  Patient will report home health being active in the next 7 days.   THN CM Short Term Goal #  3 Start Date  09/27/15   Interventions for Short Tern Goal #3  reviewed approval orders from priimary MD. Informed daughter to expect a phone call for a nurse and therapist to come see patient. Encouraged daughter to call me if she does not hear from home health in 2 days.      Tomasa Rand, RN, BSN, CEN [facility] [employer] [phone]

## 2015-09-27 NOTE — Addendum Note (Signed)
Addended by: Thana Ates on: 09/27/2015 06:15 PM   Modules accepted: Medications

## 2015-09-27 NOTE — Telephone Encounter (Signed)
Orders okayed.

## 2015-09-27 NOTE — Telephone Encounter (Signed)
Please verbally ok those orders  Not sure if [place] is still there- will cc to [employer]

## 2015-09-27 NOTE — Telephone Encounter (Signed)
Nurse with Curtis Frazier [facility] left v/m and request verbal orders for home health skilled nursing,PT, OT and eval for ST. Pt was discharged from [facility] on 09/25/15.Please advise.

## 2015-09-29 ENCOUNTER — Other Ambulatory Visit: Payer: Self-pay | Admitting: Physician Assistant

## 2015-09-29 DIAGNOSIS — I2510 Atherosclerotic heart disease of native coronary artery without angina pectoris: Secondary | ICD-10-CM

## 2015-09-29 DIAGNOSIS — I6523 Occlusion and stenosis of bilateral carotid arteries: Secondary | ICD-10-CM

## 2015-09-30 ENCOUNTER — Telehealth: Payer: Self-pay | Admitting: Cardiovascular Disease

## 2015-09-30 ENCOUNTER — Encounter: Payer: Self-pay | Admitting: Primary Care

## 2015-09-30 ENCOUNTER — Telehealth: Payer: Self-pay

## 2015-09-30 ENCOUNTER — Ambulatory Visit (INDEPENDENT_AMBULATORY_CARE_PROVIDER_SITE_OTHER): Payer: Medicare Other | Admitting: *Deleted

## 2015-09-30 ENCOUNTER — Ambulatory Visit (INDEPENDENT_AMBULATORY_CARE_PROVIDER_SITE_OTHER): Payer: Medicare Other | Admitting: Primary Care

## 2015-09-30 VITALS — BP 124/64 | HR 91 | Temp 98.2°F | Ht 68.0 in | Wt 170.1 lb

## 2015-09-30 DIAGNOSIS — D72829 Elevated white blood cell count, unspecified: Secondary | ICD-10-CM | POA: Diagnosis not present

## 2015-09-30 DIAGNOSIS — I4891 Unspecified atrial fibrillation: Secondary | ICD-10-CM

## 2015-09-30 DIAGNOSIS — I6523 Occlusion and stenosis of bilateral carotid arteries: Secondary | ICD-10-CM

## 2015-09-30 DIAGNOSIS — Z5181 Encounter for therapeutic drug level monitoring: Secondary | ICD-10-CM | POA: Diagnosis not present

## 2015-09-30 DIAGNOSIS — N179 Acute kidney failure, unspecified: Secondary | ICD-10-CM

## 2015-09-30 LAB — CBC WITH DIFFERENTIAL/PLATELET
BASOS PCT: 0.3 % (ref 0.0–3.0)
Basophils Absolute: 0 10*3/uL (ref 0.0–0.1)
EOS ABS: 0 10*3/uL (ref 0.0–0.7)
Eosinophils Relative: 0.1 % (ref 0.0–5.0)
HEMATOCRIT: 32.7 % — AB (ref 39.0–52.0)
Hemoglobin: 10.7 g/dL — ABNORMAL LOW (ref 13.0–17.0)
LYMPHS ABS: 1 10*3/uL (ref 0.7–4.0)
Lymphocytes Relative: 7.7 % — ABNORMAL LOW (ref 12.0–46.0)
MCHC: 32.8 g/dL (ref 30.0–36.0)
MCV: 91.9 fl (ref 78.0–100.0)
MONO ABS: 0.8 10*3/uL (ref 0.1–1.0)
Monocytes Relative: 6.1 % (ref 3.0–12.0)
NEUTROS ABS: 10.8 10*3/uL — AB (ref 1.4–7.7)
Neutrophils Relative %: 85.8 % — ABNORMAL HIGH (ref 43.0–77.0)
PLATELETS: 406 10*3/uL — AB (ref 150.0–400.0)
RBC: 3.56 Mil/uL — ABNORMAL LOW (ref 4.22–5.81)
RDW: 17.6 % — AB (ref 11.5–15.5)
WBC: 12.6 10*3/uL — ABNORMAL HIGH (ref 4.0–10.5)

## 2015-09-30 LAB — BASIC METABOLIC PANEL
BUN: 13 mg/dL (ref 6–23)
CO2: 24 mEq/L (ref 19–32)
Calcium: 8.8 mg/dL (ref 8.4–10.5)
Chloride: 99 mEq/L (ref 96–112)
Creatinine, Ser: 1.21 mg/dL (ref 0.40–1.50)
GFR: 61.94 mL/min (ref >60.00)
Glucose, Bld: 286 mg/dL — ABNORMAL HIGH (ref 70–99)
POTASSIUM: 4.3 meq/L (ref 3.5–5.1)
SODIUM: 132 meq/L — AB (ref 135–145)

## 2015-09-30 LAB — POCT INR: INR: 3.1

## 2015-09-30 NOTE — Addendum Note (Signed)
Addended by: [place] on: 09/30/2015 11:45 AM   Modules accepted: Level of Service

## 2015-09-30 NOTE — Patient Instructions (Addendum)
Complete lab work prior to leaving today. I will notify you of your results once received.   Continue new dose of Prednisone.  Follow up with the Atrial Fibrillation clinic next week as scheduled.  Continue Mucinex for chest congestion, ensure you are taking this with a full glass of water. You may also take Delsym for cough.   It was a pleasure to see you today! Please don't hesitate to call me with any questions.

## 2015-09-30 NOTE — Progress Notes (Signed)
Pre visit review using our clinic review tool, if applicable. No additional management support is needed unless otherwise documented below in the visit note. 

## 2015-09-30 NOTE — Telephone Encounter (Signed)
[name] RN with [facility] request verbal orders for home health nursing 2 x a week for 5 weeks. [name] also wants to know when INR needs to be checked again. INR last checked 09/30/15 at [facility] coumadin clinic.Lisa request cb.

## 2015-09-30 NOTE — Telephone Encounter (Signed)
New message   Pt daughter has a question for rn  When is due to have another cardioversion so that pt can get out of A-FIB  10/07/15 appt with Laroy Apple @ 2:00pm  Pt thought he was going to have the cardioversion done that day please call

## 2015-09-30 NOTE — Telephone Encounter (Signed)
Verbal order given to [facility]. Per Barnett Applebaum it's okay for [name] to check coumadin and report it back to her if family is okay with it. [name] will talk with family and if they are okay with it she will check it on 5/4 (pt's scheduled appt day with Barnett Applebaum), and call and give the results directly to Barnett Applebaum, if the family has any issues she will call us back and let us know. appt for 10/14/15 cancelled since Home health nurse will check it

## 2015-09-30 NOTE — Telephone Encounter (Signed)
Please ok that verbal order  I will cc to Gina to comment on plan for next INR Thanks

## 2015-09-30 NOTE — Telephone Encounter (Signed)
Next INR check is scheduled 5/4 in our Coumadin clinic.  The family is aware.

## 2015-09-30 NOTE — Progress Notes (Signed)
Patient is here to establish with our Coumadin clinic.  He was started on Coumadin in the hospital for a-fib with an INR goal of 2.0-3.0 - He has been taking 2.5 mg daily.  INR is supra therapeutic today.  Patient instructed to skip dose today, then continue taking 2.5 mg daily.  Discussed in detail appropriate diet while on Coumadin.  Patient and caregiver understand all instructions.  Will recheck in 2 weeks and make additional adjustments at that time if needed.

## 2015-09-30 NOTE — Telephone Encounter (Signed)
Called patient and informed him that his appointment with Roderic Palau NP is follow-up from the hospital. Informed patient that at his office visit they will discuss his options about his A. Fib. Patient verbalized understanding and will let his daughter know.

## 2015-09-30 NOTE — Progress Notes (Signed)
Subjective:    Patient ID: Curtis Frazier, male    DOB: [birth year], 75 y.o.   MRN: [id number]  HPI  Curtis Frazier is a 75 year old male who presents today for hospital follow up. He was evaluated on 04/12 for numerous series of hospitalizations since February 2017, when repeat labs that day noted WBC count of 19.3 (with complaints of cough and fatigue) and rise in creatinine from 1.42 to 2.4. He was encouraged to return to the hospital for these changes in labs and renal function. He had recently restarted his Prednisone for chronic polymyalgia rheumatica during his last hospitalization.   He presented to [facility] on 04/13 and was admitted for leukocytosis and acute kidney injury. During his hospitalization he underwent evaluation and had a normal xray, re-hydration with IV fluids, reduction of prednisone dose to 5 mg, blood and sputum cultures. His WBC and creatinine improved during hospitalization and he was discharged home on 04/15.  Since his discharge home he's continued to feel weak with exertional shortness of breath. He's continued to experience low grade fevers which has been ongoing for over 1 week. He endorses a cough that has been present for 2+ weeks. He's taken tylenol and Mucinex. His cough is productive with brownish sputum. Overall his cough is improved. His sputum and blood cultures from his [facility] hospitalization were negative. He continues to experience irregular heart rhythm and is due next week for follow up at the Atrial Fibrillation clinic.   Review of Systems  Constitutional: Positive for fever and fatigue.  HENT: Positive for congestion.   Respiratory: Positive for cough and shortness of breath. Negative for wheezing.   Cardiovascular: Negative for chest pain.  Genitourinary: Negative for difficulty urinating.  Neurological: Positive for weakness. Negative for dizziness and headaches.       Past Medical History  Diagnosis Date  . Other and unspecified hyperlipidemia     . Coronary atherosclerosis of unspecified type of vessel, native or graft   . Personal history of colonic polyps   . Actinic keratosis   . 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 Other psoriasis   . Scoliosis (and kyphoscoliosis), idiopathic   . GI bleed 12/10    cecam AVM  . Gastritis 12/10  . Hyperlipidemia     takes Pravastatin daily  . Hypertension     takes Metoprolol daily  . Shortness of breath     with exertion  . Headache(784.0)     occasionally  . Joint pain   . Chronic back pain     scoliosis/stenosis/radiculopathy,degenerative disc disease  . Psoriasis   . Bruises easily   . Esophageal reflux     takes Omeprazole daily  . History of colonic polyps   . Hemorrhoids   . Kidney stone     hx of  . Blood transfusion 2010  . PMR (polymyalgia rheumatica) (H[facility]) 01/20/2012    tx by specialist on a long prednisone taper  . S/P PTCA (percutaneous transluminal coronary angioplasty)   . Rheumatoid arthritis(714.0)     takes Methotrexate 7pills weekly  . Degeneration of intervertebral disc, site unspecified   . Myocardial infarction ([place]) 2010  . Pneumonia 2008; 07/2015  . Type II or unspecified type diabetes mellitus without mention of complication, not stated as uncontrolled   . Skin cancer     "arms"  . CHF (congestive heart failure) ([place])      Social History   Social History  . Marital Status: Married  Spouse Name: N/A  . Number of Children: N/A  . Years of Education: N/A   Occupational History  . Not on file.   Social History Main Topics  . Smoking status: Former Smoker -- 1.50 packs/day for 53 years    Types: Cigarettes  . Smokeless tobacco: Never Used     Comment: quit smoking cigarettes in 2010  . Alcohol Use: 0.0 oz/week    0 Standard drinks or equivalent per week     Comment: 08/11/2015 "might have 1-2 margaritas/month in the summertime"  . Drug Use: No  . Sexual Activity: No   Other Topics Concern  . Not on file   Social History Narrative    Past  Surgical History  Procedure Laterality Date  . Rotator cuff repair Left 2000  . Colonoscopy    . Vasectomy  1979  . Coronary angioplasty with stent placement  02/2009    has one stent  . Carotid doppler  10/12    0-39% R and 60-79% left   . Cataract extraction  2012  . Cardiac catheterization  2010  . Lithotripsy  2009  . Tee without cardioversion N/A 08/06/2015    Procedure: TRANSESOPHAGEAL ECHOCARDIOGRAM (TEE);  Surgeon: Skeet Latch, MD;  Location: [place];  Service: Cardiovascular;  Laterality: N/A;  . Cardioversion N/A 08/06/2015    Procedure: CARDIOVERSION;  Surgeon: Skeet Latch, MD;  Location: [facility] ENDOSCOPY;  Service: Cardiovascular;  Laterality: N/A;  . Back surgery    . Lumbar laminectomy  07/2015  . Posterior laminectomy thoracic spine      "fixed the discs"  . Skin cancer excision      "arms"  . Tee without cardioversion N/A 09/14/2015    Procedure: TRANSESOPHAGEAL ECHOCARDIOGRAM (TEE);  Surgeon: Jerline Pain, MD;  Location: [place];  Service: Cardiovascular;  Laterality: N/A;  . Cardioversion N/A 09/14/2015    Procedure: CARDIOVERSION;  Surgeon: Jerline Pain, MD;  Location: [facility] ENDOSCOPY;  Service: Cardiovascular;  Laterality: N/A;    Family History  Problem Relation Age of Onset  . Coronary artery disease Mother   . Diabetes Mother   . CAD Mother   . Coronary artery disease Sister   . Hypertension Sister   . Kidney failure Sister   . CAD Sister   . Diabetes Brother   . Prostate cancer Brother   . Pancreatic cancer Brother   . Diabetes Brother   . Anesthesia problems Neg Hx   . Hypotension Neg Hx   . Malignant hyperthermia Neg Hx   . Pseudochol deficiency Neg Hx     Allergies  Allergen Reactions  . Ace Inhibitors Other (See Comments)    Causes high K  . Angiotensin Receptor Blockers Other (See Comments)    Causes high K  . Metoprolol Other (See Comments)    May cause elevated K level  . Penicillins Swelling and Rash    Has patient had a  PCN reaction causing immediate rash, facial/tongue/throat swelling, SOB or lightheadedness with hypotension: YES Has patient had a PCN reaction causing severe rash involving mucus membranes or skin necrosis: NO Has patient had a PCN reaction that required hospitalization NO Has patient had a PCN reaction occurring within the last 10 years: NO If all of the above answers are "NO", then may proceed with Cephalosporin use.  . Tetracycline Swelling and Rash    Current Outpatient Prescriptions on File Prior to Visit  Medication Sig Dispense Refill  . acetaminophen (TYLENOL) 325 MG tablet Take 650 mg by  mouth every 6 (six) hours as needed (Pain scale of 1-4 or temp > 101).    Marland Kitchen albuterol (PROVENTIL HFA;VENTOLIN HFA) 108 (90 Base) MCG/ACT inhaler Inhale 2 puffs into the lungs every 6 (six) hours as needed for wheezing or shortness of breath. 1 Inhaler 0  . amiodarone (PACERONE) 400 MG tablet Take 1 tablet (400 mg total) by mouth 2 (two) times daily. (Patient taking differently: Taking 1/2 tablet (200 mg) 2 times a day) 60 tablet 0  . calcium-vitamin D (OSCAL WITH D) 500-200 MG-UNIT tablet Take 1 tablet by mouth daily with breakfast.    . diltiazem (CARDIZEM CD) 240 MG 24 hr capsule Take 1 capsule (240 mg total) by mouth daily. 30 capsule 0  . docusate sodium (COLACE) 100 MG capsule Take 100 mg by mouth 2 (two) times daily. Reported on 09/22/2015    . ferrous sulfate 325 (65 FE) MG tablet Take 1 tablet (325 mg total) by mouth 3 (three) times daily with meals. 90 tablet 0  . glipiZIDE (GLUCOTROL) 10 MG tablet Take 1 tablet (10 mg total) by mouth 2 (two) times daily before a meal. 60 tablet 3  . pantoprazole (PROTONIX) 40 MG tablet Take 1 tablet (40 mg total) by mouth daily. 30 tablet 0  . predniSONE (DELTASONE) 10 MG tablet Take 1 tablet (10 mg total) by mouth daily with breakfast. For 1 week then decrease to 5 mg daily 20 tablet 0  . thiamine 100 MG tablet Take 1 tablet (100 mg total) by mouth daily. 30  tablet 0  . warfarin (COUMADIN) 2.5 MG tablet Take 1 tablet (2.5 mg total) by mouth daily at 6 PM. 15 tablet 0  . furosemide (LASIX) 20 MG tablet Take 1 tablet (20 mg total) by mouth daily. (Patient not taking: Reported on 09/30/2015) 30 tablet 0   No current facility-administered medications on file prior to visit.    BP 124/64 mmHg  Pulse 91  Temp(Src) 98.2 F (36.8 C) (Oral)  Ht 5\' 8"  (1.727 m)  Wt 170 lb 1.9 oz (77.166 kg)  BMI 25.87 kg/m2  SpO2 93%     Objective:   Physical Exam  Constitutional: He is oriented to person, place, and time. He appears well-nourished.  HENT:  Right Ear: Tympanic membrane and ear canal normal.  Left Ear: Tympanic membrane and ear canal normal.  Nose: Right sinus exhibits no maxillary sinus tenderness and no frontal sinus tenderness. Left sinus exhibits no maxillary sinus tenderness and no frontal sinus tenderness.  Mouth/Throat: Oropharynx is clear and moist.  Neck: Neck supple.  Cardiovascular: Normal rate.  An irregularly irregular rhythm present.  Pulmonary/Chest: Effort normal and breath sounds normal. He has no wheezes. He has no rales.  Neurological: He is alert and oriented to person, place, and time.  Skin: Skin is warm and dry.          Assessment & Plan:  Hospital Follow Up:  Admitted to [facility] on 04/13 for leukocytosis and AKI from repeat labs at PCP office visit. During his hospitalization his renal function and WBC improved. Chest Xray, blood cultures, and sputum cultures negative. Cough likely viral in nature.  Cough still present with low grade fevers. Exam today with clear lungs which is reassuring. Does appear tired, but not sickly or toxic. He does remain in an irregular heart rhythm, due next week for re-eval at Atrial Fibrillation clinic for possible second attempt at cardioversion. Prednisone reduced from 10 mg to 5 mg per [facility]. Will repeat  CBC and BMP today. Due for INR check today in our clinic. Discussed  strict return precautions.  All hospital notes, labs, imaging reviewed on Care Everywhere.

## 2015-10-04 ENCOUNTER — Encounter: Payer: Self-pay | Admitting: *Deleted

## 2015-10-04 ENCOUNTER — Telehealth: Payer: Self-pay | Admitting: *Deleted

## 2015-10-04 ENCOUNTER — Other Ambulatory Visit: Payer: Self-pay | Admitting: *Deleted

## 2015-10-04 MED ORDER — WARFARIN SODIUM 2.5 MG PO TABS: ORAL_TABLET | ORAL | Status: DC

## 2015-10-04 NOTE — Patient Outreach (Addendum)
[facility]) Care Management  10/04/2015  KIWAN GADSDEN Jun 18, 1940 [id number]  Transition of care call  RNCM placed call to Mr.Schweitzer, patient answered the phone, reports that he is feeling weak because of his rhythm of atrial fibrillation . Mr.Arterberry report that he has walker that he uses , because he gives out of breath easily.  Mr.Simcoe reports that home health RN with [facility] visited him last on 4/21, and plans follow up visit with therapist on this week. Patient states that therapist plans to bring him a blood pressure monitor to use so he can measure his heart rate too.   Patient voiced concerned regarding his blood sugars are running a little higher since being in the hospital having the prednisone. He states he blood sugars run 100 to 120 in the morning and can get up to 300 at night, this am blood sugar 190. Discussed importance of monitoring is starch intake , and taking his medication as prescribed, patient reports his daughter fills his pill box and he takes his medications as directed.  Mr.Miralles has follow up appointment at Atrial Fib clinic on 4/27, patient reports that he has transportation to appointment as his daughter usually attends visit with him.   Plan Patient will remain active with transition of care program, RNCM has scheduled initial home visit for next week.   THN CM Care Plan Problem One        Most Recent Value   Care Plan Problem One  Recent Hospitalization with Atrial fibrillation   Role Documenting the Problem One  Care Management Coordinator   Care Plan for Problem One  Active   THN Long Term Goal (31-90 days)  Patient will not readmit to hospital in the next 31 days   THN Long Term Goal Start Date  09/27/15 [name] restarted due to admission at [facility] on 4/13-4/15]   Interventions for Problem One Long Term Goal   Reviewed [facility] program. Educated on the transiton of care, provided contatct number, explained weekly phone calls, home viist, reinforced  attending PCP appointment, taking medication as prescribed and notifying MD sooner with new symptoms or concerns   THN CM Short Term Goal #1 (0-30 days)  Patient will attend PCP appointment in the next 7 days   THN CM Short Term Goal #1 Start Date  09/27/15 [date restarted. ]   THN CM Short Term Goal #1 Met Date  10/04/15   THN CM Short Term Goal #2 (0-30 days)  Patient/caregiver  will purchase scales and begin to weigh daily and record in the next 30 days.   THN CM Short Term Goal #2 Start Date  09/27/15 [name] restarted due to admission]   Interventions for Short Term Goal #2  Reviewed with patient the importance of tracking small increments in weight gain,weigh daily , in am same time same amount of clothing , notify MD of weight gain greater than 3 pounds in a day and 5 pounds in a week.   THN CM Short Term Goal #3 (0-30 days)  Patient will report home health being active in the next 7 days.   THN CM Short Term Goal #3 Start Date  09/27/15   THN CM Short Term Goal #3 Met Date  10/04/15   THN CM Short Term Goal #4 (0-30 days)  Patient will monitor and record blood sugars twice daily in the next 30 days   THN CM Short Term Goal #4 Start Date  10/04/15   Interventions for Short Term  Goal #4  Educated on importance of monitoring blood sugars on a regular basis to track ranges and take records to MD appointments to help with making adjustments in regimine .     [name], RN, [place] [facility] [phone]- Mobile [phone]- Toll Free Main Office

## 2015-10-04 NOTE — Telephone Encounter (Signed)
Patient's daughter Curtis Frazier) left a voicemail requesting a refill on Coumadin. Curtis Frazier stated that he took his last one last night Last refill 09/16/15 #15 Last coumadin check 09/30/15

## 2015-10-04 NOTE — Telephone Encounter (Signed)
Aware, thanks!

## 2015-10-04 NOTE — Telephone Encounter (Signed)
Erin with [facility] left a voicemail stating that patient no longer needs speech therapy and he is being discharged.

## 2015-10-06 ENCOUNTER — Telehealth: Payer: Self-pay | Admitting: Family Medicine

## 2015-10-06 NOTE — Telephone Encounter (Signed)
Left voicemail giving Heidi the verbal order

## 2015-10-06 NOTE — Telephone Encounter (Signed)
Heidi @ [facility] called wanting to get a verbal order of OT 2x week for 4 weeks

## 2015-10-06 NOTE — Telephone Encounter (Signed)
Please ok that verbal order  

## 2015-10-07 ENCOUNTER — Encounter (HOSPITAL_COMMUNITY): Payer: Self-pay | Admitting: Nurse Practitioner

## 2015-10-07 ENCOUNTER — Ambulatory Visit (HOSPITAL_COMMUNITY)
Admission: RE | Admit: 2015-10-07 | Discharge: 2015-10-07 | Disposition: A | Discharge status: Home / Self Care | Payer: Medicare Other | Source: Ambulatory Visit | Attending: Nurse Practitioner | Admitting: Nurse Practitioner

## 2015-10-07 VITALS — BP 118/58 | HR 103 | Ht 68.0 in | Wt 161.0 lb

## 2015-10-07 DIAGNOSIS — I2510 Atherosclerotic heart disease of native coronary artery without angina pectoris: Secondary | ICD-10-CM | POA: Diagnosis not present

## 2015-10-07 DIAGNOSIS — K649 Unspecified hemorrhoids: Secondary | ICD-10-CM | POA: Insufficient documentation

## 2015-10-07 DIAGNOSIS — M069 Rheumatoid arthritis, unspecified: Secondary | ICD-10-CM | POA: Diagnosis not present

## 2015-10-07 DIAGNOSIS — R0602 Shortness of breath: Secondary | ICD-10-CM | POA: Insufficient documentation

## 2015-10-07 DIAGNOSIS — Z800 Family history of malignant neoplasm of digestive organs: Secondary | ICD-10-CM | POA: Insufficient documentation

## 2015-10-07 DIAGNOSIS — I4819 Other persistent atrial fibrillation: Secondary | ICD-10-CM

## 2015-10-07 DIAGNOSIS — Z833 Family history of diabetes mellitus: Secondary | ICD-10-CM | POA: Diagnosis not present

## 2015-10-07 DIAGNOSIS — Z87442 Personal history of urinary calculi: Secondary | ICD-10-CM | POA: Diagnosis not present

## 2015-10-07 DIAGNOSIS — Z0189 Encounter for other specified special examinations: Secondary | ICD-10-CM | POA: Insufficient documentation

## 2015-10-07 DIAGNOSIS — M419 Scoliosis, unspecified: Secondary | ICD-10-CM | POA: Insufficient documentation

## 2015-10-07 DIAGNOSIS — M545 Low back pain: Secondary | ICD-10-CM | POA: Insufficient documentation

## 2015-10-07 DIAGNOSIS — Z8042 Family history of malignant neoplasm of prostate: Secondary | ICD-10-CM | POA: Insufficient documentation

## 2015-10-07 DIAGNOSIS — L409 Psoriasis, unspecified: Secondary | ICD-10-CM | POA: Insufficient documentation

## 2015-10-07 DIAGNOSIS — I509 Heart failure, unspecified: Secondary | ICD-10-CM | POA: Insufficient documentation

## 2015-10-07 DIAGNOSIS — I481 Persistent atrial fibrillation: Secondary | ICD-10-CM | POA: Diagnosis present

## 2015-10-07 DIAGNOSIS — Z85828 Personal history of other malignant neoplasm of skin: Secondary | ICD-10-CM | POA: Diagnosis not present

## 2015-10-07 DIAGNOSIS — Z7901 Long term (current) use of anticoagulants: Secondary | ICD-10-CM | POA: Diagnosis not present

## 2015-10-07 DIAGNOSIS — I110 Hypertensive heart disease with heart failure: Secondary | ICD-10-CM | POA: Insufficient documentation

## 2015-10-07 DIAGNOSIS — E785 Hyperlipidemia, unspecified: Secondary | ICD-10-CM | POA: Diagnosis not present

## 2015-10-07 DIAGNOSIS — Z8249 Family history of ischemic heart disease and other diseases of the circulatory system: Secondary | ICD-10-CM | POA: Insufficient documentation

## 2015-10-07 DIAGNOSIS — I252 Old myocardial infarction: Secondary | ICD-10-CM | POA: Diagnosis not present

## 2015-10-07 DIAGNOSIS — G8929 Other chronic pain: Secondary | ICD-10-CM | POA: Insufficient documentation

## 2015-10-07 DIAGNOSIS — Z86010 Personal history of colonic polyps: Secondary | ICD-10-CM | POA: Insufficient documentation

## 2015-10-07 DIAGNOSIS — Z880 Allergy status to penicillin: Secondary | ICD-10-CM | POA: Insufficient documentation

## 2015-10-07 DIAGNOSIS — E119 Type 2 diabetes mellitus without complications: Secondary | ICD-10-CM | POA: Diagnosis not present

## 2015-10-07 LAB — BASIC METABOLIC PANEL
ANION GAP: 10 (ref 5–15)
BUN: 20 mg/dL (ref 6–20)
CHLORIDE: 103 mmol/L (ref 101–111)
CO2: 23 mmol/L (ref 22–32)
Calcium: 9.3 mg/dL (ref 8.9–10.3)
Creatinine, Ser: 1.54 mg/dL — ABNORMAL HIGH (ref 0.61–1.24)
GFR calc Af Amer: 49 mL/min — ABNORMAL LOW (ref >60)
GFR, EST NON AFRICAN AMERICAN: 42 mL/min — AB (ref >60)
Glucose, Bld: 349 mg/dL — ABNORMAL HIGH (ref 65–99)
POTASSIUM: 4.4 mmol/L (ref 3.5–5.1)
SODIUM: 136 mmol/L (ref 135–145)

## 2015-10-07 LAB — CBC
HCT: 32.7 % — ABNORMAL LOW (ref 39.0–52.0)
HEMOGLOBIN: 10.5 g/dL — AB (ref 13.0–17.0)
MCH: 29.3 pg (ref 26.0–34.0)
MCHC: 32.1 g/dL (ref 30.0–36.0)
MCV: 91.3 fL (ref 78.0–100.0)
PLATELETS: 451 10*3/uL — AB (ref 150–400)
RBC: 3.58 MIL/uL — AB (ref 4.22–5.81)
RDW: 15.8 % — ABNORMAL HIGH (ref 11.5–15.5)
WBC: 8.5 10*3/uL (ref 4.0–10.5)

## 2015-10-07 LAB — PROTIME-INR
INR: 1.99 — ABNORMAL HIGH (ref 0.00–1.49)
PROTHROMBIN TIME: 22.5 s — AB (ref 11.6–15.2)

## 2015-10-07 MED ORDER — AMIODARONE HCL 200 MG PO TABS: ORAL_TABLET | ORAL | Status: DC

## 2015-10-07 NOTE — Patient Instructions (Signed)
Cardioversion scheduled for Thursday, May 4th   - Arrive at the [facility] and go to admitting at 1130AM  -Do not eat or drink anything after midnight the night prior to your procedure.  - Take all your medication with a sip of water prior to arrival.  - You will not be able to drive home after your procedure.

## 2015-10-08 ENCOUNTER — Encounter (HOSPITAL_COMMUNITY): Payer: Self-pay | Admitting: Nurse Practitioner

## 2015-10-08 ENCOUNTER — Telehealth: Payer: Self-pay | Admitting: *Deleted

## 2015-10-08 DIAGNOSIS — L603 Nail dystrophy: Secondary | ICD-10-CM | POA: Insufficient documentation

## 2015-10-08 NOTE — Telephone Encounter (Signed)
Claiborne Billings with [facility] called. She went to eval pt today and he advise her he has been having chest pain for the last 2 days. Pt said it's a dull pain that he rates a 2/10. Pt said it's worse in the morning and it gets a little better during the day. Pt said the pain is in his sternum area and it feels worse when he takes a deep breath. Pt said the pain feels a little like it did the 1st time he went to the hospital but not as bad. Claiborne Billings wants to know what should she tell the pt to do, [facility] request call back

## 2015-10-08 NOTE — Telephone Encounter (Signed)
Called and left voicemail letting Curtis Frazier know Dr. Marliss Frazier comments.   Called pt and spoke to him, let him know Dr. Glori Frazier wants him to let cardiology know what's going on. Pt verbalized understanding. Pt said he has no other sxs. No SOB, no sweating, no nausea and only a little swelling in his feet but that's baseline for him, he has no new swelling or no excessive swelling

## 2015-10-08 NOTE — Telephone Encounter (Signed)
Please have him call his cardiology office and see what they recommend  Also comment on other symptoms re: sob/ sweating/nausea /swelling Thanks

## 2015-10-08 NOTE — Telephone Encounter (Signed)
Ref done Will route to [place] 

## 2015-10-08 NOTE — Progress Notes (Signed)
Patient ID: Curtis Frazier, male   DOB: April 25, 1941, 75 y.o.   MRN: [id number]     Primary Care Physician: Curtis Pardon, MD Referring Physician:   ROMIE Frazier is a 75 y.o. male with a h/o Curtis Frazier is a 75 year old male with history of coronary artery disease,  Afib, RA, lumbar stenosis with outpatient lumbar diskectomy in 2017. He was discharged to home but readmitted on February 4, with sepsis due to PNE, mental status changes as well as AFib with RVR. Hospital course was complicated by VDRL, acute on chronic CHF as well as AFib with RVR requiring cardioversion. He was noted to be hypoxic and severely deconditioned, had issues with confusion, and encephalopathy and was transferred to [facility] on February 21. He was readmitted to acute hospital on February 28 with ongoing encephalopathy, worsening of lethargy, and poor p.o. intake. He was started on IV fluids for acute on chronic renal failure with hyponatremia and for management of hypotension and sepsis. MRI of brain done showed no acute changes. EEG showed changes consistent with metabolic derangement. The patient has had issues with orthostatic changes and BP meds were held and he was treated with stress dose steriods. Acute renal failure had resolved with IV fluid hydration.He was further treated in CIR.  He was readmitted 3/26 for chest pain,EKG without acute ST T wave changes, CTA negative for PE or dissection Myoview 11/16: negative for ischemia, ruled out for ACS by negative enzymes, Cardiology Curtis Frazier consulted, no further workup recommended at this time, he is advised to FU with Curtis Frazier and if he has recurrence of symptoms LHC could be considered, not advisable at this time especially with CKD3. -P,Afib: INR subtherapeutic, given additional lovenox today, advised higher dose of warfarin for 2days and then repeat INR in 3days.  Readmitted 3/28 to 4/18for significant epigastric chest pain radiating up between  the shoulder blades. In further clarification with the patient Saturday night into Sunday morning patient had significant indigestion with pain radiating into the back that lasted several hours through the night. He has not noticed any similar symptoms after eating. Prior to most recent hospitalizations he not had any trouble with this type of chest discomfort. He did not have any shortness of breath or diaphoresis with the chest pain. He has been constipated from iron pills and did well while receiving stool softeners in the hospital but did not continue these after discharge. Of note patient had been on prednisone 5 mg daily prior to the February admission. At time of discharge he was discharged on 10 mg with instructions to eventually decrease to 5 mg daily and to notify either his PCP or his rheumatologist if he develops "any trouble". The daughter states that since previous discharge on last Thursday 3/23 patient has not had any prednisone because he did not have a refill and did not have any pills at the house. His only other symptoms have been pleuritic chest pain with taking a deep breath without productive cough.-- Chads Vasc score at least 4- on coumadin - Patient was on amiodarone and the dose was changed from 200 mg daily to 400 mg daily. In addition, cardizem was added but unable to achieve HR less than 100 - S/P TEE 4/4 with successfully cardioversion that failed on 4/5 PM - Will continue Cardizem 240 mg daily and amiodarone 400 mg BID- per cards recommendations  He was then admitted to [facility] 4/14 thru 4/15 for dehydration, lasix was stopped and received Iv hydration. Pt  states that he got a call from some one local and told him his labs were abnormal and he needed to go to the ER. He decided to go to [facility] for a second opinion.  He is now in afib clinic for to be scheduled for cardioversion. He has been therapeutic since 3/30 but was thin on last check and coumadin was adjusted. He is advised  when he is therapeutic x 3 weeks he can be scheduled for cardioversion. He continues on amiodrone at 200 mg bid. He is feeling better but still has fatigue. His daughter states that she witnessed her father having apnea episodes when she sat with him during hospitalizations. Will schedule sleep study. INR pending today and has INR's followed at [facility].  Today, he denies symptoms of palpitations, chest pain, shortness of breath, orthopnea, PND, lower extremity edema, dizziness, presyncope, syncope, or neurologic sequela. The patient is tolerating medications without difficulties and is otherwise without complaint today.   Past Medical History  Diagnosis Date  . Other and unspecified hyperlipidemia   . Coronary atherosclerosis of unspecified type of vessel, native or graft   . Personal history of colonic polyps   . Actinic keratosis   . Psoriatic arthropathy ([place])   . Other psoriasis   . Scoliosis (and kyphoscoliosis), idiopathic   . GI bleed 12/10    cecam AVM  . Gastritis 12/10  . Hyperlipidemia     takes Pravastatin daily  . Hypertension     takes Metoprolol daily  . Shortness of breath     with exertion  . Headache(784.0)     occasionally  . Joint pain   . Chronic back pain     scoliosis/stenosis/radiculopathy,degenerative disc disease  . Psoriasis   . Bruises easily   . Esophageal reflux     takes Omeprazole daily  . History of colonic polyps   . Hemorrhoids   . Kidney stone     hx of  . Blood transfusion 2010  . PMR (polymyalgia rheumatica) (HCC) 01/20/2012    tx by specialist on a long prednisone taper  . S/P PTCA (percutaneous transluminal coronary angioplasty)   . Rheumatoid arthritis(714.0)     takes Methotrexate 7pills weekly  . Degeneration of intervertebral disc, site unspecified   . Myocardial infarction ([place]) 2010  . Pneumonia 2008; 07/2015  . Type II or unspecified type diabetes mellitus without mention of complication, not stated as uncontrolled   .  Skin cancer     "arms"  . CHF (congestive heart failure) [facility])    Past Surgical History  Procedure Laterality Date  . Rotator cuff repair Left 2000  . Colonoscopy    . Vasectomy  1979  . Coronary angioplasty with stent placement  02/2009    has one stent  . Carotid doppler  10/12    0-39% R and 60-79% left   . Cataract extraction  2012  . Cardiac catheterization  2010  . Lithotripsy  2009  . Tee without cardioversion N/A 08/06/2015    Procedure: TRANSESOPHAGEAL ECHOCARDIOGRAM (TEE);  Surgeon: Skeet Latch, MD;  Location: [place];  Service: Cardiovascular;  Laterality: N/A;  . Cardioversion N/A 08/06/2015    Procedure: CARDIOVERSION;  Surgeon: Skeet Latch, MD;  Location: [facility] ENDOSCOPY;  Service: Cardiovascular;  Laterality: N/A;  . Back surgery    . Lumbar laminectomy  07/2015  . Posterior laminectomy thoracic spine      "fixed the discs"  . Skin cancer excision      "arms"  .  Tee without cardioversion N/A 09/14/2015    Procedure: TRANSESOPHAGEAL ECHOCARDIOGRAM (TEE);  Surgeon: Jerline Pain, MD;  Location: [place];  Service: Cardiovascular;  Laterality: N/A;  . Cardioversion N/A 09/14/2015    Procedure: CARDIOVERSION;  Surgeon: Jerline Pain, MD;  Location: [place];  Service: Cardiovascular;  Laterality: N/A;    Current Outpatient Prescriptions  Medication Sig Dispense Refill  . acetaminophen (TYLENOL) 325 MG tablet Take 650 mg by mouth every 6 (six) hours as needed (Pain scale of 1-4 or temp > 101).    Marland Kitchen albuterol (PROVENTIL HFA;VENTOLIN HFA) 108 (90 Base) MCG/ACT inhaler Inhale 2 puffs into the lungs every 6 (six) hours as needed for wheezing or shortness of breath. 1 Inhaler 0  . amiodarone (PACERONE) 200 MG tablet Take 200mg  by mouth twice daily until 5/4 then decrease to 200mg  (1 tablet) daily 30 tablet 6  . calcium-vitamin D (OSCAL WITH D) 500-200 MG-UNIT tablet Take 1 tablet by mouth daily with breakfast.    . diltiazem (CARDIZEM CD) 240 MG 24 hr capsule  Take 1 capsule (240 mg total) by mouth daily. 30 capsule 0  . docusate sodium (COLACE) 100 MG capsule Take 100 mg by mouth 2 (two) times daily. Reported on 09/22/2015    . ferrous sulfate 325 (65 FE) MG tablet Take 1 tablet (325 mg total) by mouth 3 (three) times daily with meals. 90 tablet 0  . glipiZIDE (GLUCOTROL) 10 MG tablet Take 1 tablet (10 mg total) by mouth 2 (two) times daily before a meal. 60 tablet 3  . pantoprazole (PROTONIX) 40 MG tablet Take 1 tablet (40 mg total) by mouth daily. 30 tablet 0  . predniSONE (DELTASONE) 10 MG tablet Take 1 tablet (10 mg total) by mouth daily with breakfast. For 1 week then decrease to 5 mg daily (Patient taking differently: Take 5 mg by mouth daily with breakfast. For 1 week then decrease to 5 mg daily) 20 tablet 0  . thiamine 100 MG tablet Take 1 tablet (100 mg total) by mouth daily. 30 tablet 0  . warfarin (COUMADIN) 2.5 MG tablet Take as directed by anti-coagulation clinic. 90 tablet 0  . furosemide (LASIX) 20 MG tablet Take 1 tablet (20 mg total) by mouth daily. (Patient not taking: Reported on 09/30/2015) 30 tablet 0   No current facility-administered medications for this encounter.    Allergies  Allergen Reactions  . Ace Inhibitors Other (See Comments)    Causes high K  . Angiotensin Receptor Blockers Other (See Comments)    Causes high K  . Metoprolol Other (See Comments)    May cause elevated K level  . Penicillins Swelling and Rash    Has patient had a PCN reaction causing immediate rash, facial/tongue/throat swelling, SOB or lightheadedness with hypotension: YES Has patient had a PCN reaction causing severe rash involving mucus membranes or skin necrosis: NO Has patient had a PCN reaction that required hospitalization NO Has patient had a PCN reaction occurring within the last 10 years: NO If all of the above answers are "NO", then may proceed with Cephalosporin use.  . Tetracycline Swelling and Rash    Social History   Social  History  . Marital Status: Married    Spouse Name: N/A  . Number of Children: N/A  . Years of Education: N/A   Occupational History  . Not on file.   Social History Main Topics  . Smoking status: Former Smoker -- 1.50 packs/day for 53 years    Types:  Cigarettes  . Smokeless tobacco: Never Used     Comment: quit smoking cigarettes in 2010  . Alcohol Use: 0.0 oz/week    0 Standard drinks or equivalent per week     Comment: 08/11/2015 "might have 1-2 margaritas/month in the summertime"  . Drug Use: No  . Sexual Activity: No   Other Topics Concern  . Not on file   Social History Narrative    Family History  Problem Relation Age of Onset  . Coronary artery disease Mother   . Diabetes Mother   . CAD Mother   . Coronary artery disease Sister   . Hypertension Sister   . Kidney failure Sister   . CAD Sister   . Diabetes Brother   . Prostate cancer Brother   . Pancreatic cancer Brother   . Diabetes Brother   . Anesthesia problems Neg Hx   . Hypotension Neg Hx   . Malignant hyperthermia Neg Hx   . Pseudochol deficiency Neg Hx     ROS- All systems are reviewed and negative except as per the HPI above  Physical Exam: Filed Vitals:   10/07/15 1415  BP: 118/58  Pulse: 103  Height: 5\' 8"  (1.727 m)  Weight: 161 lb (73.029 kg)    GEN- The patient is chronically ill appearing, alert and oriented x 3 today.   Head- normocephalic, atraumatic Eyes-  Sclera clear, conjunctiva pink Ears- hearing intact Oropharynx- clear Neck- supple, no JVP Lymph- no cervical lymphadenopathy Lungs- Clear to ausculation bilaterally, normal work of breathing Heart- irregular rate and rhythm, no murmurs, rubs or gallops, PMI not laterally displaced GI- soft, NT, ND, + BS Extremities- no clubbing, cyanosis, or edema MS- no significant deformity or atrophy Skin- no rash or lesion Psych- euthymic mood, full affect Neuro- strength and sensation are intact  EKG-Aflutter with variable av block,  NS st/t wave abnormality, prolonged QTc at 537 ms, qrs int 88 ms Epic records reviewed  Assessment and Plan: 1. Persistent Afib Continues on amiodarone 200 mg bid, unfortunately INR today is at 1.99 and he will need to be therapeutic with weekly INR's x 3 before DCCV can be scheduled. Daughter informed to let PCP know current INR and will need wekkly checks and results faxed to [phone].  Will see pt 5/12 for further evaluation to schedule for cardioversion.  Geroge Baseman Cleburn Maiolo, [facility] [address] [place],  [phone]

## 2015-10-08 NOTE — Telephone Encounter (Signed)
Patient's daughter called requesting a referral to podiatry.  They are not able to trim his toe nails and prefer to have this done in the clinic.  Okay to refer?

## 2015-10-11 ENCOUNTER — Telehealth: Payer: Self-pay | Admitting: *Deleted

## 2015-10-11 ENCOUNTER — Ambulatory Visit (INDEPENDENT_AMBULATORY_CARE_PROVIDER_SITE_OTHER)
Admission: RE | Admit: 2015-10-11 | Discharge: 2015-10-11 | Disposition: A | Discharge status: Home / Self Care | Payer: Medicare Other | Source: Ambulatory Visit | Attending: Family Medicine | Admitting: Family Medicine

## 2015-10-11 ENCOUNTER — Telehealth: Payer: Self-pay

## 2015-10-11 ENCOUNTER — Ambulatory Visit (INDEPENDENT_AMBULATORY_CARE_PROVIDER_SITE_OTHER): Payer: Medicare Other | Admitting: Family Medicine

## 2015-10-11 ENCOUNTER — Encounter: Payer: Self-pay | Admitting: Family Medicine

## 2015-10-11 VITALS — BP 102/58 | HR 117 | Temp 98.4°F | Wt 165.8 lb

## 2015-10-11 DIAGNOSIS — R059 Cough, unspecified: Secondary | ICD-10-CM

## 2015-10-11 DIAGNOSIS — R05 Cough: Secondary | ICD-10-CM

## 2015-10-11 DIAGNOSIS — R509 Fever, unspecified: Secondary | ICD-10-CM | POA: Diagnosis not present

## 2015-10-11 DIAGNOSIS — I6523 Occlusion and stenosis of bilateral carotid arteries: Secondary | ICD-10-CM

## 2015-10-11 LAB — CBC WITH DIFFERENTIAL/PLATELET
BASOS ABS: 0 10*3/uL (ref 0.0–0.1)
BASOS PCT: 0.2 % (ref 0.0–3.0)
Eosinophils Absolute: 0 10*3/uL (ref 0.0–0.7)
Eosinophils Relative: 0 % (ref 0.0–5.0)
HEMATOCRIT: 32.3 % — AB (ref 39.0–52.0)
Hemoglobin: 10.6 g/dL — ABNORMAL LOW (ref 13.0–17.0)
LYMPHS PCT: 8.5 % — AB (ref 12.0–46.0)
Lymphs Abs: 1.8 10*3/uL (ref 0.7–4.0)
MCHC: 32.8 g/dL (ref 30.0–36.0)
MCV: 90.1 fl (ref 78.0–100.0)
MONOS PCT: 7.8 % (ref 3.0–12.0)
Monocytes Absolute: 1.6 10*3/uL — ABNORMAL HIGH (ref 0.1–1.0)
NEUTROS ABS: 17.4 10*3/uL — AB (ref 1.4–7.7)
Neutrophils Relative %: 83.5 % — ABNORMAL HIGH (ref 43.0–77.0)
PLATELETS: 425 10*3/uL — AB (ref 150.0–400.0)
RBC: 3.59 Mil/uL — ABNORMAL LOW (ref 4.22–5.81)
RDW: 17.4 % — ABNORMAL HIGH (ref 11.5–15.5)
WBC: 20.8 10*3/uL (ref 4.0–10.5)

## 2015-10-11 LAB — COMPREHENSIVE METABOLIC PANEL
ALBUMIN: 3.1 g/dL — AB (ref 3.5–5.2)
ALT: 10 U/L (ref 0–53)
AST: 12 U/L (ref 0–37)
Alkaline Phosphatase: 70 U/L (ref 39–117)
BILIRUBIN TOTAL: 0.5 mg/dL (ref 0.2–1.2)
BUN: 20 mg/dL (ref 6–23)
CALCIUM: 8.8 mg/dL (ref 8.4–10.5)
CO2: 22 mEq/L (ref 19–32)
CREATININE: 1.57 mg/dL — AB (ref 0.40–1.50)
Chloride: 99 mEq/L (ref 96–112)
GFR: 45.85 mL/min — AB (ref >60.00)
Glucose, Bld: 275 mg/dL — ABNORMAL HIGH (ref 70–99)
Potassium: 4.3 mEq/L (ref 3.5–5.1)
Sodium: 130 mEq/L — ABNORMAL LOW (ref 135–145)
Total Protein: 7.1 g/dL (ref 6.0–8.3)

## 2015-10-11 MED ORDER — SULFAMETHOXAZOLE-TRIMETHOPRIM 400-80 MG PO TABS: 1.0000 | ORAL_TABLET | Freq: Two times a day (BID) | ORAL | Status: DC

## 2015-10-11 MED ORDER — THIAMINE HCL 100 MG PO TABS: 100.0000 mg | ORAL_TABLET | Freq: Every day | ORAL | Status: DC

## 2015-10-11 MED ORDER — PANTOPRAZOLE SODIUM 40 MG PO TBEC: 40.0000 mg | DELAYED_RELEASE_TABLET | Freq: Every day | ORAL | Status: DC

## 2015-10-11 MED ORDER — PREDNISONE 10 MG PO TABS: 15.0000 mg | ORAL_TABLET | Freq: Every day | ORAL | Status: DC

## 2015-10-11 NOTE — Telephone Encounter (Signed)
Call pt.  See CXR.  WBC up.  This is likely from the presumed infection (reasonable to start the abx) and the steroid use.   If clinically worsening, to ER.  O/w update [place] in 1-2 days.  I talked with [place], she agrees.  Thanks.

## 2015-10-11 NOTE — Telephone Encounter (Signed)
Barnabas Lister, pharmacist, advised.

## 2015-10-11 NOTE — Telephone Encounter (Signed)
LVM for pt daughter to call back.  

## 2015-10-11 NOTE — Telephone Encounter (Signed)
Curtis Frazier with [facility] left v/m; received bactrim rx; pt is also on warfarin; can increase the anti coagulant effect when taken together. Curtis Frazier request cb.

## 2015-10-11 NOTE — Progress Notes (Signed)
Pre visit review using our clinic review tool, if applicable. No additional management support is needed unless otherwise documented below in the visit note.  Fever today.  Started feeling worse in the last few days, but worse in the last day or so.  Fever up to 101.7, this AM.  Cough, with sputum, discolored, more in the last few days.  Nausea w/o vomiting, that is a new sx.  No diarrhea.  Chest wall sore with coughing.  No ST.  No ear pain but ears feel stuffy.  No abd pain.  Shoulders and R hip aches at baseline.  He has not had rheum f/u yet.  On pred currently.  Appetite is okay.  Drinking fluids well.  Known AF at baseline, with cards eval recently.  Some wheeze, mild.  A little more SOB that he attributed to AF.  Pulse and BP similar at prev cards eval.   PMH and SH reviewed  ROS: Per HPI unless specifically indicated in ROS section   Meds, vitals, and allergies reviewed.   GEN: nad, alert and oriented HEENT: mucous membranes moist, tm w/o erythema, nasal exam w/o erythema, clear discharge noted,  OP with cobblestoning NECK: supple w/o LA CV: IRR   PULM: ctab, no inc wob, no wheeze EXT: no edema SKIN: no acute rash

## 2015-10-11 NOTE — Telephone Encounter (Signed)
Left detailed message with patient's wife.

## 2015-10-11 NOTE — Patient Instructions (Signed)
Cut the warfarin from 2.5mg  a day down to 1.25mg  a day.  Keep the appointment with Barnett Applebaum later this week for INR.  Higher dose of prednisone in the meantime with food.  Start septra (antibiotic). Go to the lab on the way out.  We'll contact you with your lab and xray report.

## 2015-10-11 NOTE — Telephone Encounter (Signed)
Already addressed, we cut the warfarin dose 50% and he had f/u INR in ~3 days.  Thanks.

## 2015-10-11 NOTE — Telephone Encounter (Signed)
Received call from [facility] lab with critical results- WBC 20.8. Results given to [place].

## 2015-10-11 NOTE — Assessment & Plan Note (Signed)
Presumed bronchitis, see notes on cxr.  No other focal source on exam.   See notes on cxr.  Given current meds and intolerances, start septra and cut coumadin by 50% with f/u INR in a few days, d/w pt.  Inc pred use for now, with h/o wheeze though not wheezing at time of exam.  D/w pt at [place], if worsening, then to ER.  He agrees.   Still okay for outpatient f/u at this point.  >25 minutes spent in face to face time with patient, >50% spent in counselling or coordination of care.  Routed to PCP as FYI.  D/w pt PCP at time of OV.

## 2015-10-12 ENCOUNTER — Encounter: Payer: Self-pay | Admitting: Sports Medicine

## 2015-10-12 ENCOUNTER — Telehealth: Payer: Self-pay | Admitting: Family Medicine

## 2015-10-12 ENCOUNTER — Encounter: Payer: Self-pay | Admitting: *Deleted

## 2015-10-12 ENCOUNTER — Ambulatory Visit (INDEPENDENT_AMBULATORY_CARE_PROVIDER_SITE_OTHER): Payer: Medicare Other | Admitting: Sports Medicine

## 2015-10-12 DIAGNOSIS — M79674 Pain in right toe(s): Secondary | ICD-10-CM | POA: Diagnosis not present

## 2015-10-12 DIAGNOSIS — B351 Tinea unguium: Secondary | ICD-10-CM

## 2015-10-12 DIAGNOSIS — M79675 Pain in left toe(s): Secondary | ICD-10-CM | POA: Diagnosis not present

## 2015-10-12 DIAGNOSIS — E119 Type 2 diabetes mellitus without complications: Secondary | ICD-10-CM | POA: Diagnosis not present

## 2015-10-12 DIAGNOSIS — I6523 Occlusion and stenosis of bilateral carotid arteries: Secondary | ICD-10-CM

## 2015-10-12 NOTE — Patient Instructions (Signed)
Diabetes and Foot Care Diabetes may cause you to have problems because of poor blood supply (circulation) to your feet and legs. This may cause the skin on your feet to become thinner, break easier, and heal more slowly. Your skin may become dry, and the skin may peel and crack. You may also have nerve damage in your legs and feet causing decreased feeling in them. You may not notice minor injuries to your feet that could lead to infections or more serious problems. Taking care of your feet is one of the most important things you can do for yourself.  HOME CARE INSTRUCTIONS  Wear shoes at all times, even in the house. Do not go barefoot. Bare feet are easily injured.  Check your feet daily for blisters, cuts, and redness. If you cannot see the bottom of your feet, use a mirror or ask someone for help.  Wash your feet with warm water (do not use hot water) and mild soap. Then pat your feet and the areas between your toes until they are completely dry. Do not soak your feet as this can dry your skin.  Apply a moisturizing lotion or petroleum jelly (that does not contain alcohol and is unscented) to the skin on your feet and to dry, brittle toenails. Do not apply lotion between your toes.  Trim your toenails straight across. Do not dig under them or around the cuticle. File the edges of your nails with an emery board or nail file.  Do not cut corns or calluses or try to remove them with medicine.  Wear clean socks or stockings every day. Make sure they are not too tight. Do not wear knee-high stockings since they may decrease blood flow to your legs.  Wear shoes that fit properly and have enough cushioning. To break in new shoes, wear them for just a few hours a day. This prevents you from injuring your feet. Always look in your shoes before you put them on to be sure there are no objects inside.  Do not cross your legs. This may decrease the blood flow to your feet.  If you find a minor scrape,  cut, or break in the skin on your feet, keep it and the skin around it clean and dry. These areas may be cleansed with mild soap and water. Do not cleanse the area with peroxide, alcohol, or iodine.  When you remove an adhesive bandage, be sure not to damage the skin around it.  If you have a wound, look at it several times a day to make sure it is healing.  Do not use heating pads or hot water bottles. They may burn your skin. If you have lost feeling in your feet or legs, you may not know it is happening until it is too late.  Make sure your health care provider performs a complete foot exam at least annually or more often if you have foot problems. Report any cuts, sores, or bruises to your health care provider immediately. SEEK MEDICAL CARE IF:   You have an injury that is not healing.  You have cuts or breaks in the skin.  You have an ingrown nail.  You notice redness on your legs or feet.  You feel burning or tingling in your legs or feet.  You have pain or cramps in your legs and feet.  Your legs or feet are numb.  Your feet always feel cold. SEEK IMMEDIATE MEDICAL CARE IF:   There is increasing redness,   swelling, or pain in or around a wound.  There is a red line that goes up your leg.  Pus is coming from a wound.  You develop a fever or as directed by your health care provider.  You notice a bad smell coming from an ulcer or wound.   This information is not intended to replace advice given to you by your health care provider. Make sure you discuss any questions you have with your health care provider.   Document Released: 05/26/2000 Document Revised: 01/29/2013 Document Reviewed: 11/05/2012 Elsevier Interactive Patient Education 2016 Elsevier Inc.  

## 2015-10-12 NOTE — Telephone Encounter (Signed)
See Dr Josefine Class note if needed   Prednisone was increased to 15 mg Q am with breakfast Bactrim DS started 1 po bid for 7d Warfarin was decreased to 1.25 mg daily until f/u for next INR   Thanks   Please update me re: how he is feeling

## 2015-10-12 NOTE — Telephone Encounter (Signed)
Info above faxed to [place] at [facility].

## 2015-10-12 NOTE — Telephone Encounter (Signed)
medication change yesterday, [facility] want to see if you can fax it over [phone] Claiborne Billings from [facility] - phone [phone] Either call or fax , they need to know what the change is  Thanks

## 2015-10-13 ENCOUNTER — Telehealth: Payer: Self-pay | Admitting: *Deleted

## 2015-10-13 ENCOUNTER — Other Ambulatory Visit: Payer: Self-pay | Admitting: *Deleted

## 2015-10-13 NOTE — Patient Outreach (Signed)
[name]) Care Management  10/13/2015  Curtis Frazier May 05, 1941 [id number]   Transition of care call   RNCM placed call to patient as part of the transition of care program , initial home visit was scheduled for 5/4, noted patient now has MD office lab draw appointment, scheduled near that time, placed call today to discuss plan regarding need to reschedule home visit patient in agreement reports that he has home heath physical therapy appointment for 5/4, then MD appointment.  Mr.Curtis Frazier reports that he is feeling stronger, breathing easier,decreased cough, states home health RN listened to his heart on yesterday and it sounded regular.Patient reports improvement in distance that he is able to walk with physical therapy.  Mr.Curtis Frazier reports that is weights are staying between 160 and 163, patient reports that he has some swelling in his ankles but that it is decreased from where it was.  Patient reports good appetite , and that his blood sugar was 130 this am, but will be back up to 200 by evening.  Patient denies any new concerns ,  taking his medications by pill organizer that has been filled by his daughter.  Plan Patient will remain active with transition of care program with weekly outreaches and home visit rescheduled for next  week. Patient will notify MD for worsening symptoms, increased shortness of breath, swelling or sudden weight gain.   THN CM Care Plan Problem One        Most Recent Value   Care Plan Problem One  Recent Hospitalization with Atrial fibrillation   Role Documenting the Problem One  Care Management Coordinator   Care Plan for Problem One  Active   THN Long Term Goal (31-90 days)  Patient will not readmit to hospital in the next 31 days   THN Long Term Goal Start Date  09/27/15 [name] restarted due to admission at [facility] on 4/13-4/15]   Interventions for Problem One Long Term Goal  Discussed weekly outreaches , home viist schedule  taking medication  as prescribed and notifying MD sooner with new symptoms or concerns   THN CM Short Term Goal #1 (0-30 days)  Patient will attend PCP appointment in the next 7 days   THN CM Short Term Goal #1 Start Date  09/27/15 [date restarted. ]   THN CM Short Term Goal #1 Met Date  10/04/15   THN CM Short Term Goal #2 (0-30 days)  Patient/caregiver  will purchase scales and begin to weigh daily and record in the next 30 days.   THN CM Short Term Goal #2 Start Date  09/27/15 [name] restarted due to admission]   [facility] CM Short Term Goal #2 Met Date  10/13/15   THN CM Short Term Goal #3 (0-30 days)  Patient will report home health being active in the next 7 days.   THN CM Short Term Goal #3 Start Date  09/27/15   THN CM Short Term Goal #3 Met Date  10/04/15   THN CM Short Term Goal #4 (0-30 days)  Patient will monitor and record blood sugars twice daily in the next 30 days   THN CM Short Term Goal #4 Start Date  10/04/15   Interventions for Short Term Goal #4  Reinforced to take records to MD appointments to help with making adjustments in regimine .   THN CM Short Term Goal #5 (0-30 days)  Patient will be able to report at least 3 signs of heart failure symptoms to notify MD of .  in the next 30 days    THN CM Short Term Goal #5 Start Date  10/13/15   Interventions for Short Term Goal #5  Educated patient on symptoms in yellow zone of heart failure,weight gain greater than 3 pounds in a day, 5 pounds in a week, increased swelling, cough and the importance of notifying MD sooner .     [name], RN, [facility] [facility] [phone]- Mobile [phone]- Toll Free Main Office

## 2015-10-13 NOTE — Progress Notes (Signed)
Patient ID: Curtis Frazier, male   DOB: 24-Jul-1940, 75 y.o.   MRN: [id number] Subjective: Curtis Frazier is a 75 y.o. male patient with history of diabetes who presents to office today complaining of long, painful nails  while ambulating in shoes; unable to trim. Patient states that the glucose reading this morning was 150 mg/dl. Patient denies any new changes in medication or new problems. Patient denies any new cramping, numbness, burning or tingling in the legs. Admits to baseline numbness in upper legs and back secondary to back surgery at lumbar level.   Patient Active Problem List   Diagnosis Date Noted  . Fever 10/11/2015  . Dystrophic nail 10/08/2015  . Abnormal finding on thyroid function test 09/24/2015  . Acute on chronic diastolic heart failure ([place]) 09/24/2015  . Injury of kidney 09/24/2015  . At risk for infection 09/24/2015  . Type 2 diabetes mellitus ([place]) 09/24/2015  . Pain in the chest   . Acute on chronic diastolic HF (heart failure) ([place])   . Atrial fibrillation with RVR ([place])   . Atrial flutter ([place])   . Chest pain on exertion 09/09/2015  . Elevated troponin 09/09/2015  . Kidney disease, chronic, stage III (GFR 30-59 ml/min) 09/09/2015  . Anemia of chronic disease 09/09/2015  . Anemia in chronic illness 09/09/2015  . Chest pain 09/09/2015  . Chronic kidney disease (CKD), stage III (moderate) 09/09/2015  . Acute posthemorrhagic anemia   . Abnormal thyroid function test   . Arterial hypotension   . Paroxysmal atrial fibrillation (HCC)   . Diabetes mellitus with complication ([place])   . Symptomatic cholelithiasis 09/07/2015  . Calculus of gallbladder 09/07/2015  . Spondylosis of lumbar region without myelopathy or radiculopathy   . Urinary retention 08/12/2015  . Bladder retention 08/12/2015  . Chronic diastolic congestive heart failure ([place]) 08/10/2015  . Chronic diastolic heart failure ([place]) 08/10/2015  . Back pain without radiation   . Counseling regarding advanced  care planning and goals of care   . S/P lumbar laminectomy 07/17/2015  . Postprocedural state 07/17/2015  . Back pain, thoracic 01/15/2015  . Chest wall pain 01/15/2015  . Encounter for Medicare annual wellness exam 07/07/2013  . BPH (benign prostatic hyperplasia) 07/05/2012  . Enlarged prostate 07/05/2012  . Prostate cancer screening 06/27/2012  . Low TSH level 01/22/2012  . PMR (polymyalgia rheumatica) (HCC) 01/20/2012  . Polymyalgia rheumatica ([place]) 01/20/2012  . HYPERKALEMIA 02/18/2010  . ANGIODYSPLASIA-INTESTINE 06/10/2009  . Angiodysplasia of colon 06/10/2009  . MURMUR 05/21/2009  . Left carotid bruit 05/21/2009  . Cardiac murmur 05/21/2009  . Coronary atherosclerosis 03/09/2009  . CAD in native artery 03/09/2009  . Rheumatoid arthritis ([place]) 04/29/2008  . HX, PERSONAL, COLONIC POLYPS 01/17/2007  . History of colon polyps 01/17/2007  . PSORIATIC ARTHRITIS 09/18/2006  . PSORIASIS 09/18/2006  . 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 DEGENERATIVE DISC DISEASE 09/18/2006  . SCOLIOSIS 09/18/2006  . TOBACCO ABUSE, HX OF 09/18/2006  . 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 Narrowing of intervertebral disc space 09/18/2006  . Idiopathic scoliosis 09/18/2006  . Nicotine addiction 09/18/2006   Current Outpatient Prescriptions on File Prior to Visit  Medication Sig Dispense Refill  . acetaminophen (TYLENOL) 325 MG tablet Take 650 mg by mouth every 6 (six) hours as needed (Pain scale of 1-4 or temp > 101).    Marland Kitchen albuterol (PROVENTIL HFA;VENTOLIN HFA) 108 (90 Base) MCG/ACT inhaler Inhale 2 puffs into the lungs every 6 (six) hours as needed for wheezing or shortness of  breath. 1 Inhaler 0  . amiodarone (PACERONE) 200 MG tablet Take 200mg  by mouth twice daily until 5/4 then decrease to 200mg  (1 tablet) daily 30 tablet 6  . calcium-vitamin D (OSCAL WITH D) 500-200 MG-UNIT tablet Take 1 tablet by mouth daily with breakfast.    . diltiazem (CARDIZEM CD) 240 MG 24 hr capsule Take 1 capsule  (240 mg total) by mouth daily. 30 capsule 0  . docusate sodium (COLACE) 100 MG capsule Take 100 mg by mouth 2 (two) times daily. Reported on 09/22/2015    . ferrous sulfate 325 (65 FE) MG tablet Take 1 tablet (325 mg total) by mouth 3 (three) times daily with meals. 90 tablet 0  . furosemide (LASIX) 20 MG tablet Take 1 tablet (20 mg total) by mouth daily. 30 tablet 0  . glipiZIDE (GLUCOTROL) 10 MG tablet Take 1 tablet (10 mg total) by mouth 2 (two) times daily before a meal. 60 tablet 3  . pantoprazole (PROTONIX) 40 MG tablet Take 1 tablet (40 mg total) by mouth daily. 30 tablet 5  . predniSONE (DELTASONE) 10 MG tablet Take 1.5 tablets (15 mg total) by mouth daily with breakfast. 15 tablet 0  . sulfamethoxazole-trimethoprim (BACTRIM) 400-80 MG tablet Take 1 tablet by mouth 2 (two) times daily. 14 tablet 0  . thiamine 100 MG tablet Take 1 tablet (100 mg total) by mouth daily. 30 tablet 5  . warfarin (COUMADIN) 2.5 MG tablet Take as directed by anti-coagulation clinic. 90 tablet 0   No current facility-administered medications on file prior to visit.   Allergies  Allergen Reactions  . Ace Inhibitors Other (See Comments)    Causes high K Causes high K  . Angiotensin Receptor Blockers Other (See Comments)    Causes high K Causes high K  . Metoprolol Other (See Comments)    May cause elevated K level May cause elevated K level  . Penicillins Swelling and Rash    Has patient had a PCN reaction causing immediate rash, facial/tongue/throat swelling, SOB or lightheadedness with hypotension: YES Has patient had a PCN reaction causing severe rash involving mucus membranes or skin necrosis: NO Has patient had a PCN reaction that required hospitalization NO Has patient had a PCN reaction occurring within the last 10 years: NO If all of the above answers are "NO", then may proceed with Cephalosporin use. Has patient had a PCN reaction causing immediate rash, facial/tongue/throat swelling, SOB or  lightheadedness with hypotension: YES Has patient had a PCN reaction causing severe rash involving mucus membranes or skin necrosis: NO Has patient had a PCN reaction that required hospitalization NO Has patient had a PCN reaction occurring within the last 10 years: NO If all of the above answers are "NO", then may proceed with Cephalosporin use.  . Tetracycline Swelling and Rash    Objective: General: Patient is awake, alert, and oriented x 3 and in no acute distress.  Integument: Skin is warm, dry and supple bilateral. Nails are tender, long, thickened and  dystrophic with subungual debris, consistent with onychomycosis, 1-5 bilateral. No signs of infection. No open lesions or preulcerative lesions present bilateral. Remaining integument unremarkable.  Vasculature:  Dorsalis Pedis pulse 2/4 bilateral. Posterior Tibial pulse  1/4 bilateral.  Capillary fill time <3 sec 1-5 bilateral. Scant hair growth to the level of the digits. Temperature gradient within normal limits. Mild varicosities present bilateral. No edema present bilateral.   Neurology: The patient has intact sensation measured with a 5.07/10g Semmes Weinstein Monofilament at  all pedal sites bilateral. Vibratory sensation diminished bilateral with tuning fork. No Babinski sign present bilateral.   Musculoskeletal: No symptomatic gross pedal deformities noted bilateral. Muscular strength 5/5 in all lower extremity muscular groups bilateral without pain on range of motion. No tenderness with calf compression bilateral.  Assessment and Plan: Problem List Items Addressed This Visit    None    Visit Diagnoses    Dermatophytosis of nail    -  Primary    Toe pain, bilateral        Diabetes mellitus without complication ([place])           -Examined patient. -Discussed and educated patient on diabetic foot care, especially with  regards to the vascular, neurological and musculoskeletal systems.  -Stressed the importance of good  glycemic control and the detriment of not  controlling glucose levels in relation to the foot. -Mechanically debrided all nails 1-5 bilateral using sterile nail nipper and filed with dremel without incident  -Answered all patient questions -Patient to return in 3 months for at risk foot care -Patient advised to call the office if any problems or questions arise in the meantime.  Landis Martins, DPM

## 2015-10-13 NOTE — Telephone Encounter (Signed)
Dr. Glori Bickers sent me a message saying please check in with pt and see how he is doing, called pt multiple times and no answer phone was busy

## 2015-10-14 ENCOUNTER — Encounter (HOSPITAL_COMMUNITY): Payer: Self-pay

## 2015-10-14 ENCOUNTER — Ambulatory Visit: Payer: Self-pay | Admitting: *Deleted

## 2015-10-14 ENCOUNTER — Other Ambulatory Visit: Payer: Self-pay | Admitting: Family Medicine

## 2015-10-14 ENCOUNTER — Ambulatory Visit (INDEPENDENT_AMBULATORY_CARE_PROVIDER_SITE_OTHER): Payer: Medicare Other | Admitting: *Deleted

## 2015-10-14 ENCOUNTER — Ambulatory Visit: Payer: Medicare Other

## 2015-10-14 ENCOUNTER — Ambulatory Visit (HOSPITAL_COMMUNITY): Admit: 2015-10-14 | Payer: Self-pay | Admitting: Cardiology

## 2015-10-14 ENCOUNTER — Telehealth: Payer: Self-pay

## 2015-10-14 DIAGNOSIS — Z5181 Encounter for therapeutic drug level monitoring: Secondary | ICD-10-CM | POA: Diagnosis not present

## 2015-10-14 DIAGNOSIS — I4891 Unspecified atrial fibrillation: Secondary | ICD-10-CM

## 2015-10-14 LAB — POCT INR: INR: 1.8

## 2015-10-14 SURGERY — CARDIOVERSION
Anesthesia: Monitor Anesthesia Care

## 2015-10-14 MED ORDER — GLUCOSE BLOOD VI STRP: ORAL_STRIP | Status: DC

## 2015-10-14 NOTE — Telephone Encounter (Signed)
Please refill both for 6 mo  

## 2015-10-14 NOTE — Progress Notes (Signed)
INR is sub therapeutic today.  He was started on antibiotic and increased prednisone on 5/1.  Since that time, he has been taking half a tablet of Coumadin daily as instructed by Dr. Damita Dunnings.  No missed doses or medication changes.  Discussed with patient and his daughter - instructions given to begin taking one tablet daily.  Will recheck on 5/8 and make additional adjustments as needed.  Patient encouraged to continue to avoid greens in diet.

## 2015-10-14 NOTE — Progress Notes (Signed)
Pre visit review using our clinic review tool, if applicable. No additional management support is needed unless otherwise documented below in the visit note. 

## 2015-10-14 NOTE — Telephone Encounter (Signed)
Spoke with daughter and she said pt is doing better, he is still in afib but they didn't want to do the cardio-conversion until his coumadin is in normal range. Pt is seeing Barnett Applebaum today to have it rechecked  Daughter did want me to ask you if pt should be back on a cholesterol med. Pt was on lipitor in the past and somehow during his hospital stays it was d/c giving his heart issues she wanted me to ask if he should be back on the lipitor or any other cholesterol med

## 2015-10-14 NOTE — Telephone Encounter (Signed)
pts daughter said [facility] cannot process test strips; pt has one test strip left. Spoke with Barnabas Lister at [facility] and said Dr Damita Dunnings is still not coming up [facility]EC[facility] certified; Recertification is supposed to be done. Barnabas Lister said Dr Damita Dunnings sent a written rx for one touch ultra test strips # 100 x 3 to test BS once daily; jack cannot take verbal resent electronically by Dr Loura Pardon. Patient notified and voiced understanding.

## 2015-10-14 NOTE — Telephone Encounter (Signed)
done

## 2015-10-14 NOTE — Telephone Encounter (Signed)
Glad he is feeling better! I want them to call cardiology about the lipitor - unsure why it was stopped (? Is his joint and muscle pain improved w/o it at all?)

## 2015-10-14 NOTE — Telephone Encounter (Signed)
Electronic refill request, I can't tell if Dr. Glori Bickers is suppose to prescribe meds or not, I don't think she has filled meds, please advise

## 2015-10-15 ENCOUNTER — Telehealth: Payer: Self-pay

## 2015-10-15 NOTE — Telephone Encounter (Signed)
[name] RN with [facility] left v/m requesting verbal order for BP cuff and pulse ox that will transmit to phone co to monitor pts vital signs.Lisa request cb.

## 2015-10-15 NOTE — Telephone Encounter (Signed)
Please ok that verbal order  

## 2015-10-15 NOTE — Telephone Encounter (Signed)
Left voicemail giving [name] the verbal order

## 2015-10-15 NOTE — Telephone Encounter (Signed)
Daughter advise of Dr. Marliss Coots comments and verbalized understanding, he has an appt with Cardiology on Friday so she will check with them then

## 2015-10-18 ENCOUNTER — Ambulatory Visit (INDEPENDENT_AMBULATORY_CARE_PROVIDER_SITE_OTHER): Payer: Medicare Other | Admitting: *Deleted

## 2015-10-18 DIAGNOSIS — Z5181 Encounter for therapeutic drug level monitoring: Secondary | ICD-10-CM | POA: Diagnosis not present

## 2015-10-18 DIAGNOSIS — I4891 Unspecified atrial fibrillation: Secondary | ICD-10-CM | POA: Diagnosis not present

## 2015-10-18 LAB — POCT INR: INR: 1.5

## 2015-10-18 NOTE — Progress Notes (Signed)
Pre visit review using our clinic review tool, if applicable. No additional management support is needed unless otherwise documented below in the visit note. 

## 2015-10-18 NOTE — Progress Notes (Signed)
INR remains sub therapeutic.  No missed doses or diet changes.  Patient instructed to take two tablets today, then begin taking one tablet daily except 1.5 tablets on Mondays and Fridays.  Patient encouraged to continue to be consistent with his intake of greens.  Will recheck in one week and make additional adjustments at that time if needed.

## 2015-10-21 ENCOUNTER — Encounter: Payer: Self-pay | Admitting: *Deleted

## 2015-10-21 ENCOUNTER — Other Ambulatory Visit: Payer: Self-pay | Admitting: *Deleted

## 2015-10-21 MED ORDER — PREDNISONE 10 MG PO TABS: 10.0000 mg | ORAL_TABLET | Freq: Every day | ORAL | Status: DC

## 2015-10-21 NOTE — Telephone Encounter (Signed)
Daughter wanted me to ask Dr. Glori Bickers to refill pt's prednisone ([facility] [address])  Also they have a ? about pt's lasix. Pt's lasix was d/c at one of his hospital stays and they never restarted it, daughter doesn't know if he should be taking med or not, they don't need a refill they just need to know if pt needs to restart it or not

## 2015-10-21 NOTE — Patient Outreach (Signed)
[facility]) Care Management   10/21/2015  DHAVAL WOO 08/07/1940 [id number]  Curtis Frazier is an 75 y.o. male   Initial home visit  Subjective: Patient discussed he is feeling some better., decreased shortness of breath. Patient discussed the MD appointments that he has on tomorrow and how he hoping his coumadin level increases so that he can have his rhythm converted soon.  Patient discussed he has completed his antibiotic, his cough has improved, but he still coughs up thick white sputum especially in the morning.   Objective: BP 118/70 mmHg  Pulse 110  Resp 18  Wt 160 lb 6.4 oz (72.757 kg)  SpO2 98%  Patient using his rolling walker during visit. Review of Systems  Constitutional: Negative.   HENT: Negative.   Eyes: Negative.   Respiratory: Positive for sputum production. Negative for shortness of breath and wheezing.        White sputum especially in the morning  Cardiovascular: Positive for leg swelling.       Slight edema at right ankle area.  Gastrointestinal: Negative.   Genitourinary: Negative.   Musculoskeletal: Positive for joint pain. Negative for falls.       Complaint of hip discomfort from arthritis  Skin: Negative.   Neurological: Negative.   Psychiatric/Behavioral: Negative.     Physical Exam  Constitutional: He is oriented to person, place, and time. He appears well-developed and well-nourished.  Cardiovascular: Intact distal pulses.  An irregular rhythm present. Tachycardia present.   Murmur heard. Respiratory: Effort normal. He has no wheezes.  GI: Soft.  Musculoskeletal:  Using rolling walker  Neurological: He is alert and oriented to person, place, and time.  Skin: Skin is warm, dry and intact.     Psychiatric: He has a normal mood and affect. His behavior is normal. Judgment and thought content normal.    Encounter Medications:   Outpatient Encounter Prescriptions as of 10/21/2015  Medication Sig  . amiodarone (PACERONE)  200 MG tablet Take 258m by mouth twice daily until 5/4 then decrease to 2010m(1 tablet) daily  . calcium-vitamin D (OSCAL WITH D) 500-200 MG-UNIT tablet Take 1 tablet by mouth daily with breakfast.  . diltiazem (CARDIZEM CD) 240 MG 24 hr capsule TAKE 1 CAPSULE BY MOUTH DAILY  . docusate sodium (COLACE) 100 MG capsule Take 100 mg by mouth 2 (two) times daily. Reported on 09/22/2015  . ferrous sulfate 325 (65 FE) MG tablet Take 1 tablet (325 mg total) by mouth 3 (three) times daily with meals.  . Marland KitchenlipiZIDE (GLUCOTROL) 10 MG tablet Take 1 tablet (10 mg total) by mouth 2 (two) times daily before a meal.  . glucose blood (ONE TOUCH ULTRA TEST) test strip Check blood sugar once daily and as directed. Dx E11.8  . pantoprazole (PROTONIX) 40 MG tablet Take 1 tablet (40 mg total) by mouth daily.  . Marland Kitchenhiamine 100 MG tablet Take 1 tablet (100 mg total) by mouth daily.  . Marland Kitchenarfarin (COUMADIN) 2.5 MG tablet Take as directed by anti-coagulation clinic.  . Marland Kitchencetaminophen (TYLENOL) 325 MG tablet Take 650 mg by mouth every 6 (six) hours as needed (Pain scale of 1-4 or temp > 101).  . Marland Kitchenlbuterol (PROVENTIL HFA;VENTOLIN HFA) 108 (90 Base) MCG/ACT inhaler Inhale 2 puffs into the lungs every 6 (six) hours as needed for wheezing or shortness of breath.  . furosemide (LASIX) 20 MG tablet TAKE 1 TABLET BY MOUTH DAILY  . predniSONE (DELTASONE) 10 MG tablet Take 1.5 tablets (15 mg total)  by mouth daily with breakfast.  . sulfamethoxazole-trimethoprim (BACTRIM) 400-80 MG tablet Take 1 tablet by mouth 2 (two) times daily. (Patient not taking: Reported on 10/21/2015)   No facility-administered encounter medications on file as of 10/21/2015.    Functional Status:   In your present state of health, do you have any difficulty performing the following activities: 10/13/2015 09/08/2015  Hearing? N -  Vision? N -  Difficulty concentrating or making decisions? N -  Walking or climbing stairs? Y -  Dressing or bathing? N -  Doing  errands, shopping? [name]  Preparing Food and eating ? N -  Using the Toilet? N -  In the past six months, have you accidently leaked urine? Y -  Do you have problems with loss of bowel control? N -  Managing your Medications? Y -  Managing your Finances? Y -  Housekeeping or managing your Housekeeping? N -    Fall/Depression Screening:    PHQ 2/9 Scores 10/04/2015 06/28/2015 11/23/2014 07/07/2013 07/05/2012  PHQ - 2 Score 0 0 0 0 0    Assessment:  Home visit Mrs.Butler present, RN also placed call to [name], daughter, per patient consent to discuss he medications. Patient's daughter fills patient pill organizer weekly, Patient was recently discharged from hospital and all medications have been reviewed. Per [name] she needs to call to MD office for refill on prednisone and to ask MD about cholesterol medication patient  has been on in the past and whether patient should be taking lasix or not it was her understanding that he does not need lasix at this time., I offered, [name] prefers to place call.  Atrial Fibrillation Heart rhythm still irregular, patient reports, decrease in shortness of breath and able to tolerate activities of daily living  and participate with physical therapy.  Heart Failure Patient continues to weigh daily and record, no increases in weight gain. Patient is using [facility] health scales daily and understands they will bring him a blood pressure cuff also. Need to assess long range plans to obtain scale and blood pressure monitor .  Diabetes Patient continues to monitor his  blood sugars 3 times a day morning blood sugars 130 and up to 300's in the evening. No hypoglycemic episodes. Reinforced to continue to monitor his intake of breads, sweets, pasta and potatoes.  Plan:   Patient will remain active with transition of care call on next week, and continue to follow post transition . RN placed call to [facility] home health RN regarding blood pressure monitor to go  along with scales, able to leave a hippa compliant message with my call back number.  Will update patient with information as obtained. Will assess [facility] resources if needed for scales and blood pressure monitor. Provided and review Emmi handout on atrial fib and keeping track of weight. Patient will notify MD of worsening symptoms.  THN CM Care Plan Problem One        Most Recent Value   Care Plan Problem One  Recent Hospitalization with Atrial fibrillation   Role Documenting the Problem One  Care Management Coordinator   Care Plan for Problem One  Active   THN Long Term Goal (31-90 days)  Patient will not readmit to hospital in the next 31 days   THN Long Term Goal Start Date  09/27/15 [name] restarted due to admission at [facility] on 4/13-4/15]   Interventions for Problem One Long Term Goal  Reinforced to notify MD sooner for increased in cough,  sputum production or temperature   THN CM Short Term Goal #1 (0-30 days)  Patient will attend PCP appointment in the next 7 days   THN CM Short Term Goal #1 Start Date  09/27/15 [date restarted. ]   THN CM Short Term Goal #1 Met Date  10/04/15   THN CM Short Term Goal #2 (0-30 days)  Patient/caregiver  will purchase scales and begin to weigh daily and record in the next 30 days.   THN CM Short Term Goal #2 Start Date  09/27/15 [name] restarted due to admission]   THN CM Short Term Goal #2 Met Date  -- [patient has scales from [facility]]   Interventions for Short Term Goal #2  RN to place call to [facility] regarding lenght of time patient will be able to use current scales from company provided and reviewed emmi handout on heart failure  keeping track of your weight   THN CM Short Term Goal #3 (0-30 days)  Patient will report home health being active in the next 7 days.   THN CM Short Term Goal #3 Start Date  09/27/15   THN CM Short Term Goal #3 Met Date  10/04/15   THN CM Short Term Goal #4 (0-30 days)  Patient will monitor and record blood sugars  three times daily in the next 30 days [goal adjusted ]   THN CM Short Term Goal #4 Start Date  10/04/15   [facility] CM Short Term Goal #4 Met Date  10/21/15   THN CM Short Term Goal #5 (0-30 days)  Patient will be able to report at least 3 signs of heart failure symptoms to notify MD of .in the next 30 days    THN CM Short Term Goal #5 Start Date  10/13/15   Interventions for Short Term Goal #5  Reinforced education on heart failure zones, provided and reviewed magnet .     [facility] CM Care Plan Problem Two        Most Recent Value   Care Plan Problem Two  Atrial Fibrillation self care management   Role Documenting the Problem Two  Care Management Coordinator   Care Plan for Problem Two  Active   Interventions for Problem Two Long Term Goal   Provided and reviewed emmi handout on atrial fibrillation treatment   THN Long Term Goal (31-90) days  Patient will report increased knowledge self care management of atrial fibrillation in the nexrt 20 days   THN Long Term Goal Start Date  10/21/15   THN CM Short Term Goal #1 (0-30 days)  Patient will be able to state diet recommendations while taking coumadin in the next 14 days   THN CM Short Term Goal #1 Start Date  10/21/15   Interventions for Short Term Goal #2   Educated patient on importance of being consistent with foods containing vitamin K , while taking coumadin, discussed rationale    THN CM Short Term Goal #2 (0-30 days)  Patient will continue with daily exercise as taught by physical therapy in the next 14 days   THN CM Short Term Goal #2 Start Date  10/21/15   Interventions for Short Term Goal #2  Educated patient on the importance of continuing exercise as tolerated, to be able to participate in daily living activities , notify MD of  decreased ability to complete ADL's     [name], RN, [place] Management [phone]- Mobile [phone]- [place]

## 2015-10-21 NOTE — Telephone Encounter (Signed)
Daughter notified of Dr. Marliss Coots comments. Rx sent to pharmacy for 10 mg of prednisone and daughter will check with cardiology about lasix

## 2015-10-21 NOTE — Telephone Encounter (Signed)
I think (if he is feeling better) we can go back on the prednisone to 10 mg daily -please send in 1 mo  If I am wrong about that let me know  I think the lasix question is up to their cardiologist

## 2015-10-22 ENCOUNTER — Inpatient Hospital Stay (HOSPITAL_COMMUNITY): Admission: RE | Admit: 2015-10-22 | Payer: Medicare Other | Source: Ambulatory Visit

## 2015-10-22 ENCOUNTER — Ambulatory Visit (INDEPENDENT_AMBULATORY_CARE_PROVIDER_SITE_OTHER): Payer: Medicare Other | Admitting: *Deleted

## 2015-10-22 ENCOUNTER — Telehealth: Payer: Self-pay | Admitting: Internal Medicine

## 2015-10-22 ENCOUNTER — Encounter (HOSPITAL_COMMUNITY): Payer: Self-pay | Admitting: Nurse Practitioner

## 2015-10-22 ENCOUNTER — Ambulatory Visit (HOSPITAL_COMMUNITY)
Admission: RE | Admit: 2015-10-22 | Discharge: 2015-10-22 | Disposition: A | Discharge status: Home / Self Care | Payer: Medicare Other | Source: Ambulatory Visit | Attending: Internal Medicine | Admitting: Internal Medicine

## 2015-10-22 ENCOUNTER — Ambulatory Visit (HOSPITAL_BASED_OUTPATIENT_CLINIC_OR_DEPARTMENT_OTHER)
Admission: RE | Admit: 2015-10-22 | Discharge: 2015-10-22 | Disposition: A | Discharge status: Home / Self Care | Payer: Medicare Other | Source: Ambulatory Visit | Attending: Nurse Practitioner | Admitting: Nurse Practitioner

## 2015-10-22 VITALS — BP 120/64 | HR 101 | Ht 68.0 in | Wt 165.2 lb

## 2015-10-22 DIAGNOSIS — I4892 Unspecified atrial flutter: Secondary | ICD-10-CM | POA: Insufficient documentation

## 2015-10-22 DIAGNOSIS — I481 Persistent atrial fibrillation: Secondary | ICD-10-CM | POA: Diagnosis not present

## 2015-10-22 DIAGNOSIS — I6523 Occlusion and stenosis of bilateral carotid arteries: Secondary | ICD-10-CM | POA: Insufficient documentation

## 2015-10-22 DIAGNOSIS — I4891 Unspecified atrial fibrillation: Secondary | ICD-10-CM

## 2015-10-22 DIAGNOSIS — I2510 Atherosclerotic heart disease of native coronary artery without angina pectoris: Secondary | ICD-10-CM | POA: Diagnosis not present

## 2015-10-22 DIAGNOSIS — I4819 Other persistent atrial fibrillation: Secondary | ICD-10-CM

## 2015-10-22 LAB — COMPREHENSIVE METABOLIC PANEL
ALT: 22 U/L (ref 17–63)
AST: 20 U/L (ref 15–41)
Albumin: 3 g/dL — ABNORMAL LOW (ref 3.5–5.0)
Alkaline Phosphatase: 64 U/L (ref 38–126)
Anion gap: 10 (ref 5–15)
BUN: 34 mg/dL — ABNORMAL HIGH (ref 6–20)
CHLORIDE: 104 mmol/L (ref 101–111)
CO2: 21 mmol/L — AB (ref 22–32)
Calcium: 9.6 mg/dL (ref 8.9–10.3)
Creatinine, Ser: 1.7 mg/dL — ABNORMAL HIGH (ref 0.61–1.24)
GFR, EST AFRICAN AMERICAN: 43 mL/min — AB (ref >60)
GFR, EST NON AFRICAN AMERICAN: 37 mL/min — AB (ref >60)
Glucose, Bld: 288 mg/dL — ABNORMAL HIGH (ref 65–99)
POTASSIUM: 6 mmol/L — AB (ref 3.5–5.1)
SODIUM: 135 mmol/L (ref 135–145)
Total Bilirubin: 0.6 mg/dL (ref 0.3–1.2)
Total Protein: 6.8 g/dL (ref 6.5–8.1)

## 2015-10-22 LAB — CBC
HCT: 34.9 % — ABNORMAL LOW (ref 39.0–52.0)
Hemoglobin: 11 g/dL — ABNORMAL LOW (ref 13.0–17.0)
MCH: 29.1 pg (ref 26.0–34.0)
MCHC: 31.5 g/dL (ref 30.0–36.0)
MCV: 92.3 fL (ref 78.0–100.0)
PLATELETS: 347 10*3/uL (ref 150–400)
RBC: 3.78 MIL/uL — AB (ref 4.22–5.81)
RDW: 16.1 % — ABNORMAL HIGH (ref 11.5–15.5)
WBC: 9.6 10*3/uL (ref 4.0–10.5)

## 2015-10-22 LAB — PROTIME-INR
INR: 1.23 (ref 0.00–1.49)
PROTHROMBIN TIME: 15.6 s — AB (ref 11.6–15.2)

## 2015-10-22 NOTE — Telephone Encounter (Signed)
Received a call from Doristine Devoid, NP at the AFIB clinic. She was trying to cardiovert pt, but INR continues to get lower instead of at goal. She wanted to know if Barnett Applebaum, RN could see the patient twice weekly so that we can try to get his INR at goal as soon as possible. Or she wanted to see if pt could be transitioned to Eliquis or Xarelto. She also wanted to make [place] aware that his creatinine on his labs today was 1.7 and potassium was 6.0- she is not sure of the cause for this and wanted to know if we could contact the patient to repeat a BMET in 1 week.

## 2015-10-22 NOTE — Progress Notes (Signed)
Patient ID: [name] [address]RE[address], male   DOB: 03/11/1941, 75 y.o.   MRN: [id number]     Primary Care Physician: Loura Pardon, MD Referring Physician: [facility] f/u Cardiologist: Dr. Tamala Fothergill is a 75 y.o. male with  history of coronary artery disease,  Afib, RA, lumbar stenosis with outpatient lumbar diskectomy in 2017. He was discharged to home but readmitted on February 4, with sepsis due to PNE, mental status changes as well as AFib with RVR. Hospital course was complicated by VDRL, acute on chronic CHF as well as AFib with RVR requiring cardioversion. He was noted to be hypoxic and severely deconditioned, had issues with confusion, and encephalopathy and was transferred to [facility] on February 21. He was readmitted to acute hospital on February 28 with ongoing encephalopathy, worsening of lethargy, and poor p.o. intake. He was started on IV fluids for acute on chronic renal failure with hyponatremia and for management of hypotension and sepsis. MRI of brain done showed no acute changes. EEG showed changes consistent with metabolic derangement. The patient has had issues with orthostatic changes and BP meds were held and he was treated with stress dose steriods. Acute renal failure had resolved with IV fluid hydration.He was further treated in CIR.  He was readmitted 3/26 for chest pain,EKG without acute ST T wave changes, CTA negative for PE or dissection Myoview 11/16: negative for ischemia, ruled out for ACS by negative enzymes, Cardiology Dr.Skains consulted, no further workup recommended at this time, he is advised to FU with Dr.Nishan and if he has recurrence of symptoms LHC could be considered, not advisable at this time especially with CKD3. -P,Afib: INR subtherapeutic, given additional lovenox today, advised higher dose of warfarin for 2days and then repeat INR in 3days.  Readmitted 3/28 to 4/6 for significant epigastric chest pain radiating up between the shoulder  blades. In further clarification with the patient Saturday night into Sunday morning patient had significant indigestion with pain radiating into the back that lasted several hours through the night. He has not noticed any similar symptoms after eating. Prior to most recent hospitalizations he not had any trouble with this type of chest discomfort. He did not have any shortness of breath or diaphoresis with the chest pain. He has been constipated from iron pills and did well while receiving stool softeners in the hospital but did not continue these after discharge. Of note patient had been on prednisone 5 mg daily prior to the February admission. At time of discharge he was discharged on 10 mg with instructions to eventually decrease to 5 mg daily and to notify either his PCP or his rheumatologist if he develops "any trouble". The daughter states that since previous discharge on last Thursday 3/23 patient has not had any prednisone because he did not have a refill and did not have any pills at the house. His only other symptoms have been pleuritic chest pain with taking a deep breath without productive cough.-- Chads Vasc score at least 4- on coumadin - Patient was on amiodarone and the dose was changed from 200 mg daily to 400 mg daily. In addition, cardizem was added but unable to achieve HR less than 100 - S/P TEE 4/4 with successfully cardioversion that failed on 4/5 PM - Will continue Cardizem 240 mg daily and amiodarone 400 mg BID- per cards recommendations  He was then admitted to [facility] 4/14 thru 4/15 for dehydration, lasix was stopped and received Iv hydration. Pt states that he  got a call from some one local and told him his labs were abnormal and he needed to go to the ER. He decided to go to [facility] for a second opinion.  He is now in afib clinic for to be scheduled for cardioversion. He has been therapeutic since 3/30 but was thin on last check and coumadin was adjusted. He is advised when he is  therapeutic x 3 weeks he can be scheduled for cardioversion. He continues on amiodrone at 200 mg bid. He is feeling better but still has fatigue. His daughter states that she witnessed her father having apnea episodes when she sat with him during hospitalizations. Will schedule sleep study. INR pending today and has INR's followed at [facility].  Returns to afib clinic 5/12, he has developed bronchitis  since here last and coumadin clinic([place]) decreased his coumadin dose by 50% when antibiotics started by PCP. Last INR there was 1.5, after antibiotics, and coumadin was increased. He states he is feeling better and breathing improved since treatment of his bronchitis. I discussed cardioversion with Gay Filler, PharmD and she said if pt did not want to drag cardioversion out several more weeks to get therapeutic, then cardioversion could be done  if INR was above 2.0 at time of procedure and TEE was done . Pt/duaghter agree to this approach and will get labs today. If labs are acceptable will proceed with this plan. Pt states he did forget coumadin a couple of times over the last couple of weeks.  Today, he denies symptoms of palpitations, chest pain, shortness of breath, orthopnea, PND, lower extremity edema, dizziness, presyncope, syncope, or neurologic sequela. The patient is tolerating medications without difficulties and is otherwise without complaint today.   Past Medical History  Diagnosis Date  . Other and unspecified hyperlipidemia   . Coronary atherosclerosis of unspecified type of vessel, native or graft   . Personal history of colonic polyps   . Actinic keratosis   . Psoriatic arthropathy ([place])   . Other psoriasis   . Scoliosis (and kyphoscoliosis), idiopathic   . GI bleed 12/10    cecam AVM  . Gastritis 12/10  . Hyperlipidemia     takes Pravastatin daily  . Hypertension     takes Metoprolol daily  . Shortness of breath     with exertion  . Headache(784.0)     occasionally    . Joint pain   . Chronic back pain     scoliosis/stenosis/radiculopathy,degenerative disc disease  . Psoriasis   . Bruises easily   . Esophageal reflux     takes Omeprazole daily  . History of colonic polyps   . Hemorrhoids   . Kidney stone     hx of  . Blood transfusion 2010  . PMR (polymyalgia rheumatica) (HCC) 01/20/2012    tx by specialist on a long prednisone taper  . S/P PTCA (percutaneous transluminal coronary angioplasty)   . Rheumatoid arthritis(714.0)     takes Methotrexate 7pills weekly  . Degeneration of intervertebral disc, site unspecified   . Myocardial infarction ([place]) 2010  . Pneumonia 2008; 07/2015  . Type II or unspecified type diabetes mellitus without mention of complication, not stated as uncontrolled   . Skin cancer     "arms"  . CHF (congestive heart failure) [facility])    Past Surgical History  Procedure Laterality Date  . Rotator cuff repair Left 2000  . Colonoscopy    . Vasectomy  1979  . Coronary angioplasty with stent placement  02/2009    has one stent  . Carotid doppler  10/12    0-39% R and 60-79% left   . Cataract extraction  2012  . Cardiac catheterization  2010  . Lithotripsy  2009  . Tee without cardioversion N/A 08/06/2015    Procedure: TRANSESOPHAGEAL ECHOCARDIOGRAM (TEE);  Surgeon: Skeet Latch, MD;  Location: [place];  Service: Cardiovascular;  Laterality: N/A;  . Cardioversion N/A 08/06/2015    Procedure: CARDIOVERSION;  Surgeon: Skeet Latch, MD;  Location: [facility] ENDOSCOPY;  Service: Cardiovascular;  Laterality: N/A;  . Back surgery    . Lumbar laminectomy  07/2015  . Posterior laminectomy thoracic spine      "fixed the discs"  . Skin cancer excision      "arms"  . Tee without cardioversion N/A 09/14/2015    Procedure: TRANSESOPHAGEAL ECHOCARDIOGRAM (TEE);  Surgeon: Jerline Pain, MD;  Location: [place];  Service: Cardiovascular;  Laterality: N/A;  . Cardioversion N/A 09/14/2015    Procedure: CARDIOVERSION;  Surgeon: Jerline Pain, MD;  Location: [place];  Service: Cardiovascular;  Laterality: N/A;    Current Outpatient Prescriptions  Medication Sig Dispense Refill  . acetaminophen (TYLENOL) 325 MG tablet Take 650 mg by mouth every 6 (six) hours as needed (Pain scale of 1-4 or temp > 101).    Marland Kitchen albuterol (PROVENTIL HFA;VENTOLIN HFA) 108 (90 Base) MCG/ACT inhaler Inhale 2 puffs into the lungs every 6 (six) hours as needed for wheezing or shortness of breath. 1 Inhaler 0  . amiodarone (PACERONE) 200 MG tablet Take 200mg  by mouth twice daily until 5/4 then decrease to 200mg  (1 tablet) daily (Patient taking differently: Take 200 mg by mouth daily. Take 200mg  by mouth twice daily until 5/4 then decrease to 200mg  (1 tablet) daily) 30 tablet 6  . calcium-vitamin D (OSCAL WITH D) 500-200 MG-UNIT tablet Take 1 tablet by mouth daily with breakfast.    . diltiazem (CARDIZEM CD) 240 MG 24 hr capsule TAKE 1 CAPSULE BY MOUTH DAILY 30 capsule 5  . docusate sodium (COLACE) 100 MG capsule Take 100 mg by mouth 2 (two) times daily. Reported on 09/22/2015    . ferrous sulfate 325 (65 FE) MG tablet Take 1 tablet (325 mg total) by mouth 3 (three) times daily with meals. 90 tablet 0  . glipiZIDE (GLUCOTROL) 10 MG tablet Take 1 tablet (10 mg total) by mouth 2 (two) times daily before a meal. 60 tablet 3  . glucose blood (ONE TOUCH ULTRA TEST) test strip Check blood sugar once daily and as directed. Dx E11.8 100 each 3  . pantoprazole (PROTONIX) 40 MG tablet Take 1 tablet (40 mg total) by mouth daily. 30 tablet 5  . predniSONE (DELTASONE) 10 MG tablet Take 1 tablet (10 mg total) by mouth daily with breakfast. 30 tablet 0  . thiamine 100 MG tablet Take 1 tablet (100 mg total) by mouth daily. 30 tablet 5  . warfarin (COUMADIN) 2.5 MG tablet Take as directed by anti-coagulation clinic. 90 tablet 0  . furosemide (LASIX) 20 MG tablet TAKE 1 TABLET BY MOUTH DAILY (Patient not taking: Reported on 10/21/2015) 30 tablet 5   No current  facility-administered medications for this encounter.    Allergies  Allergen Reactions  . Ace Inhibitors Other (See Comments)    Causes high K Causes high K  . Angiotensin Receptor Blockers Other (See Comments)    Causes high K Causes high K  . Metoprolol Other (See Comments)    May cause elevated  K level May cause elevated K level  . Penicillins Swelling and Rash    Has patient had a PCN reaction causing immediate rash, facial/tongue/throat swelling, SOB or lightheadedness with hypotension: YES Has patient had a PCN reaction causing severe rash involving mucus membranes or skin necrosis: NO Has patient had a PCN reaction that required hospitalization NO Has patient had a PCN reaction occurring within the last 10 years: NO If all of the above answers are "NO", then may proceed with Cephalosporin use. Has patient had a PCN reaction causing immediate rash, facial/tongue/throat swelling, SOB or lightheadedness with hypotension: YES Has patient had a PCN reaction causing severe rash involving mucus membranes or skin necrosis: NO Has patient had a PCN reaction that required hospitalization NO Has patient had a PCN reaction occurring within the last 10 years: NO If all of the above answers are "NO", then may proceed with Cephalosporin use.  . Tetracycline Swelling and Rash    Social History   Social History  . Marital Status: Married    Spouse Name: N/A  . Number of Children: N/A  . Years of Education: N/A   Occupational History  . Not on file.   Social History Main Topics  . Smoking status: Former Smoker -- 1.50 packs/day for 53 years    Types: Cigarettes  . Smokeless tobacco: Never Used     Comment: quit smoking cigarettes in 2010  . Alcohol Use: 0.0 oz/week    0 Standard drinks or equivalent per week     Comment: 08/11/2015 "might have 1-2 margaritas/month in the summertime"  . Drug Use: No  . Sexual Activity: No   Other Topics Concern  . Not on file   Social History  Narrative    Family History  Problem Relation Age of Onset  . Coronary artery disease Mother   . Diabetes Mother   . CAD Mother   . Coronary artery disease Sister   . Hypertension Sister   . Kidney failure Sister   . CAD Sister   . Diabetes Brother   . Prostate cancer Brother   . Pancreatic cancer Brother   . Diabetes Brother   . Anesthesia problems Neg Hx   . Hypotension Neg Hx   . Malignant hyperthermia Neg Hx   . Pseudochol deficiency Neg Hx     ROS- All systems are reviewed and negative except as per the HPI above  Physical Exam: Filed Vitals:   10/22/15 0923  BP: 120/64  Pulse: 101  Height: 5\' 8"  (1.727 m)  Weight: 165 lb 3.2 oz (74.934 kg)    GEN- The patient is chronically ill appearing, alert and oriented x 3 today.   Head- normocephalic, atraumatic Eyes-  Sclera clear, conjunctiva pink Ears- hearing intact Oropharynx- clear Neck- supple, no JVP Lymph- no cervical lymphadenopathy Lungs- Clear to ausculation bilaterally, normal work of breathing Heart- irregular rate and rhythm, no murmurs, rubs or gallops, PMI not laterally displaced GI- soft, NT, ND, + BS Extremities- no clubbing, cyanosis, or edema MS- no significant deformity or atrophy Skin- no rash or lesion Psych- euthymic mood, full affect Neuro- strength and sensation are intact  EKG-Aflutter with variable av block, 101 bpm, LAD, Prolonged qtc at 534 ms, Programmed EKG  reading Acute MI, probably due to flutter waves, clinically does not fit presentation. Epic records reviewed   Assessment and Plan: 1. Persistent Afib Continues on amiodarone 200 qd, unfortunately INR today is at 1.25 and he will need to be at least 2.0  or above prior to TEE/cardioversion. Notified [facility] office and spoke to the NP there in lieu of Dr. Alba Cory absence. She was informed of the sub therapeutic INR and Kidney function shows  increased Creat/bun/kt, only med difference was recent antibiotic which he has now  finished. She will talk to Dr. Glori Bickers to try aggressively to increase INR to at least 2.0 and will repeat bmet on Monday. He may be an candidate for DOAC if INR's continue to be subtherapeutic.  Once INR's have stabilized, will schedule for cardioversion.  Geroge Baseman Deondre Marinaro, [facility] [address] [place], [place] [address] [phone]

## 2015-10-22 NOTE — Patient Instructions (Signed)
Cardioversion scheduled for  - Arrive at the [facility] and go to admitting at  -Do not eat or drink anything after midnight the night prior to your procedure.  - Take all your medication with a sip of water prior to arrival.  - You will not be able to drive home after your procedure.  Marzetta Board will be in touch to schedule cardioversion once we have results of coumadin level

## 2015-10-22 NOTE — Telephone Encounter (Signed)
INR was 1.23 today at AFIB clinic.  I spoke to patient's daughter, [name].  Patient has not missed any doses, but has had an increased intake of greens.  Coumadin increased 20%.  [name] will give him 5 mg today and tomorrow, then begin giving 2.5 mg daily except 5 mg on Mondays, Wednesdays, and Fridays.  Patient encouraged to remain consistent with diet.  Will recheck as planned on 5/15, although I do not anticipate making any changes to his dose at that time given the short amount of time since today's increase.  [name] agrees to bring him for recheck twice weekly if still not at goal.  Changes to therapy will be made weekly pending need.    Dr. Glori Bickers - do you want to put in orders for recheck BMET on Monday as well?

## 2015-10-22 NOTE — Telephone Encounter (Signed)
That would be great, thanks!

## 2015-10-25 ENCOUNTER — Other Ambulatory Visit: Payer: Self-pay | Admitting: Family Medicine

## 2015-10-25 ENCOUNTER — Ambulatory Visit (INDEPENDENT_AMBULATORY_CARE_PROVIDER_SITE_OTHER): Payer: Medicare Other | Admitting: *Deleted

## 2015-10-25 DIAGNOSIS — I10 Essential (primary) hypertension: Secondary | ICD-10-CM | POA: Diagnosis not present

## 2015-10-25 DIAGNOSIS — N189 Chronic kidney disease, unspecified: Secondary | ICD-10-CM

## 2015-10-25 DIAGNOSIS — N179 Acute kidney failure, unspecified: Secondary | ICD-10-CM

## 2015-10-25 DIAGNOSIS — R946 Abnormal results of thyroid function studies: Secondary | ICD-10-CM | POA: Diagnosis not present

## 2015-10-25 DIAGNOSIS — Z5181 Encounter for therapeutic drug level monitoring: Secondary | ICD-10-CM

## 2015-10-25 DIAGNOSIS — I4891 Unspecified atrial fibrillation: Secondary | ICD-10-CM | POA: Diagnosis not present

## 2015-10-25 DIAGNOSIS — E119 Type 2 diabetes mellitus without complications: Secondary | ICD-10-CM | POA: Diagnosis not present

## 2015-10-25 DIAGNOSIS — R7989 Other specified abnormal findings of blood chemistry: Secondary | ICD-10-CM

## 2015-10-25 LAB — COMPREHENSIVE METABOLIC PANEL
ALK PHOS: 61 U/L (ref 39–117)
ALT: 15 U/L (ref 0–53)
AST: 13 U/L (ref 0–37)
Albumin: 3.5 g/dL (ref 3.5–5.2)
BUN: 37 mg/dL — AB (ref 6–23)
CALCIUM: 9.2 mg/dL (ref 8.4–10.5)
CHLORIDE: 104 meq/L (ref 96–112)
CO2: 22 mEq/L (ref 19–32)
Creatinine, Ser: 1.57 mg/dL — ABNORMAL HIGH (ref 0.40–1.50)
GFR: 45.85 mL/min — AB (ref >60.00)
Glucose, Bld: 423 mg/dL — ABNORMAL HIGH (ref 70–99)
POTASSIUM: 5.2 meq/L — AB (ref 3.5–5.1)
Sodium: 133 mEq/L — ABNORMAL LOW (ref 135–145)
Total Bilirubin: 0.3 mg/dL (ref 0.2–1.2)
Total Protein: 6.7 g/dL (ref 6.0–8.3)

## 2015-10-25 LAB — CBC WITH DIFFERENTIAL/PLATELET
BASOS ABS: 0 10*3/uL (ref 0.0–0.1)
BASOS PCT: 0.2 % (ref 0.0–3.0)
EOS PCT: 0 % (ref 0.0–5.0)
Eosinophils Absolute: 0 10*3/uL (ref 0.0–0.7)
HEMATOCRIT: 35.4 % — AB (ref 39.0–52.0)
Hemoglobin: 11.4 g/dL — ABNORMAL LOW (ref 13.0–17.0)
LYMPHS ABS: 1.4 10*3/uL (ref 0.7–4.0)
LYMPHS PCT: 14.6 % (ref 12.0–46.0)
MCHC: 32.3 g/dL (ref 30.0–36.0)
MCV: 90.2 fl (ref 78.0–100.0)
MONOS PCT: 3.8 % (ref 3.0–12.0)
Monocytes Absolute: 0.4 10*3/uL (ref 0.1–1.0)
NEUTROS ABS: 7.9 10*3/uL — AB (ref 1.4–7.7)
NEUTROS PCT: 81.4 % — AB (ref 43.0–77.0)
PLATELETS: 350 10*3/uL (ref 150.0–400.0)
RBC: 3.93 Mil/uL — ABNORMAL LOW (ref 4.22–5.81)
RDW: 18.2 % — ABNORMAL HIGH (ref 11.5–15.5)
WBC: 9.7 10*3/uL (ref 4.0–10.5)

## 2015-10-25 LAB — HEMOGLOBIN A1C: HEMOGLOBIN A1C: 9.2 % — AB (ref 4.6–6.5)

## 2015-10-25 LAB — POCT INR: INR: 1.7

## 2015-10-25 LAB — TSH: TSH: 1.08 u[IU]/mL (ref 0.35–4.50)

## 2015-10-25 NOTE — Progress Notes (Signed)
Pre visit review using our clinic review tool, if applicable. No additional management support is needed unless otherwise documented below in the visit note. 

## 2015-10-25 NOTE — Telephone Encounter (Signed)
Pt needs refill on iron medication. [facility] uses the [facility] on [address] in [place]. Please advise

## 2015-10-25 NOTE — Telephone Encounter (Signed)
Ferrous sulfate last filled taking one tid # 90 on 09/02/15 by Reesa Chew PA. Pt last seen f/u 09/30/15.Please advise.

## 2015-10-25 NOTE — Progress Notes (Signed)
Pre visit review using our clinic review tool, if applicable. No additional management support is needed unless otherwise documented below in the visit note.  INR remains sub therapeutic, although much improved from Friday 5/12.  Patient has been taking Coumadin as instructed with no missed doses or diet changes.  Patient instructed to continue taking 2.5 mg daily except 5 mg on Mondays, Wednesdays, and Fridays.  Will recheck on 5/18 and make additional adjustments as needed.  He was encouraged to remain consistent with his diet.

## 2015-10-25 NOTE — Telephone Encounter (Signed)
Please refill times 3 

## 2015-10-26 ENCOUNTER — Telehealth: Payer: Self-pay

## 2015-10-26 MED ORDER — FERROUS SULFATE 325 (65 FE) MG PO TABS: 325.0000 mg | ORAL_TABLET | Freq: Three times a day (TID) | ORAL | Status: DC

## 2015-10-26 NOTE — Telephone Encounter (Signed)
Please ok those verbal orders  

## 2015-10-26 NOTE — Telephone Encounter (Signed)
[facility] left v/m requesting verbal order to do PT and INR on 10/28/15.

## 2015-10-27 ENCOUNTER — Telehealth: Payer: Self-pay | Admitting: Family Medicine

## 2015-10-27 NOTE — Telephone Encounter (Signed)
[facility] called  to let us know that pt fell yesterday. He has a couple scrapes on elbow and right hand. The nurse will be seeing him within 24 hours.  His vitas are p 70, bp 114/70, t 98.6 resp 22 cb num [phone] Thank you

## 2015-10-27 NOTE — Telephone Encounter (Signed)
Gave okay order to [employer]

## 2015-10-27 NOTE — Telephone Encounter (Signed)
Thanks for the update -if there are more concerns please let me know  Please address fall precautions with him

## 2015-10-27 NOTE — Telephone Encounter (Signed)
Left voicemail with [facility] OT letting her know Dr. Marliss Coots comments

## 2015-10-28 ENCOUNTER — Other Ambulatory Visit: Payer: Medicare Other

## 2015-10-28 ENCOUNTER — Other Ambulatory Visit: Payer: Self-pay | Admitting: *Deleted

## 2015-10-28 ENCOUNTER — Other Ambulatory Visit (INDEPENDENT_AMBULATORY_CARE_PROVIDER_SITE_OTHER): Payer: Medicare Other

## 2015-10-28 DIAGNOSIS — Z5181 Encounter for therapeutic drug level monitoring: Secondary | ICD-10-CM

## 2015-10-28 DIAGNOSIS — I4891 Unspecified atrial fibrillation: Secondary | ICD-10-CM | POA: Diagnosis not present

## 2015-10-28 LAB — POCT INR: INR: 2.4

## 2015-10-28 NOTE — Patient Outreach (Signed)
[facility]) Care Management  10/28/2015  Curtis Frazier 1940-12-12 [id number]  Transition of care   RN placed call to patient as part of the transition of care note, Curtis Frazier reports that he is doing okay this morning. Patient denies shortness of breath  increased swelling or weight gain.  Curtis Frazier reports that his weight this am is 163, no increase in weight. Patient is using scales/blood pressure monitor system through [facility], his blood pressure is 123/64.  Curtis Frazier continues to monitor his blood sugar 3 times a day and record, his is concerned with blood sugar readings in the am his readings are 140 to 150's and at noon and evening time his blood sugars go up to 200 to 300's. Patient reports that he is taking her medication daily and follows monitors diet with control of starches,sweets, breads, pasta. Encouraged patient to take his record to his upcoming PCP appointment.  Curtis Frazier reports that he had a recent fall , in which he lost his balance while trying to put his pants on , he usually sits down to due this but he was standing up. Patient denies dizziness, reports scrapes on his arm, not bleeding problem. Fall prevention educated.  Discussed with patient the health coach program for continued education and management of chronic disease, patient will benefit and will decide if he wants to be involved once community care needs met.  Curtis Frazier discussed his lab appointment today for INR, he is eager to proceed with cardioversion when labs at level per MD plan, patient states he is now in atrial flutter.   Plan RN will place final transition of care call on next week, and evaluate for continued community care management needs.   THN CM Care Plan Problem One        Most Recent Value   Care Plan Problem One  Recent Hospitalization with Atrial fibrillation   Role Documenting the Problem One  Care Management Coordinator   Care Plan for Problem One  Active   THN  Long Term Goal (31-90 days)  Patient will not readmit to hospital in the next 31 days   THN Long Term Goal Start Date  09/27/15 [name] restarted due to admission at [facility] on 4/13-4/15]   Interventions for Problem One Long Term Goal  Encouraged patient to notify MD of symptoms of shortness of breath, weight gain swelling sooner to arrange home office visit.   THN CM Short Term Goal #1 (0-30 days)  Patient will attend PCP appointment in the next 7 days   THN CM Short Term Goal #1 Start Date  09/27/15 [date restarted. ]   THN CM Short Term Goal #1 Met Date  10/04/15   THN CM Short Term Goal #2 (0-30 days)  Patient/caregiver  will purchase scales and begin to weigh daily and record in the next 30 days.   THN CM Short Term Goal #2 Start Date  09/27/15 [name] restarted due to admission]   THN CM Short Term Goal #2 Met Date  -- [patient has scales from [facility] home [facility]]   Interventions for Short Term Goal #2  Patient has scales to continue to use after home health completed.   THN CM Short Term Goal #3 (0-30 days)  Patient will report home health being active in the next 7 days.   THN CM Short Term Goal #3 Start Date  09/27/15   [facility] CM Short Term Goal #3 Met Date  10/04/15   [facility] CM Short Term Goal #  4 (0-30 days)  Patient will monitor and record blood sugars three times daily in the next 30 days [goal adjusted ]   THN CM Short Term Goal #4 Start Date  10/04/15   [facility] CM Short Term Goal #4 Met Date  10/21/15   THN CM Short Term Goal #5 (0-30 days)  Patient will be able to report at least 3 signs of heart failure symptoms to notify MD of .in the next 30 days    THN CM Short Term Goal #5 Start Date  10/13/15   [facility] CM Short Term Goal #5 Met Date  10/28/15    [facility] CM Care Plan Problem Two        Most Recent Value   Care Plan Problem Two  Atrial Fibrillation self care management   Role Documenting the Problem Two  Care Management Coordinator   Care Plan for Problem Two  Active   Interventions for Problem  Two Long Term Goal   -- [patient reports that he is now in atrial flutter]   THN Long Term Goal (31-90) days  Patient will report increased knowledge self care management of atrial fibrillation in the nexrt 20 days   THN Long Term Goal Start Date  10/21/15   [facility] Long Term Goal Met Date  10/28/15   THN CM Short Term Goal #1 (0-30 days)  Patient will be able to state diet recommendations and follow  while taking coumadin in the next 14 days   THN CM Short Term Goal #1 Start Date  10/21/15   Interventions for Short Term Goal #2   Reinforced on importance of being consistent with foods containing vitamin K , while taking coumadin, discussed rationale    THN CM Short Term Goal #2 (0-30 days)  Patient will continue with daily exercise as taught by physical therapy in the next 14 days   THN CM Short Term Goal #2 Start Date  10/21/15   Interventions for Short Term Goal #2  Reinforced to conitnue excercise plan taught by physcial therapy even after they have completed services to improve balance and endurance     [name], RN, [facility] [phone]- Mobile [phone]- [place]

## 2015-10-29 ENCOUNTER — Other Ambulatory Visit: Payer: Self-pay | Admitting: Family Medicine

## 2015-10-29 ENCOUNTER — Telehealth: Payer: Self-pay

## 2015-10-29 DIAGNOSIS — E119 Type 2 diabetes mellitus without complications: Secondary | ICD-10-CM

## 2015-10-29 MED ORDER — GLIPIZIDE 10 MG PO TABS: 10.0000 mg | ORAL_TABLET | Freq: Two times a day (BID) | ORAL | Status: DC

## 2015-10-29 NOTE — Telephone Encounter (Signed)
[name] RN with [facility] left v/m requesting verbal orders for home health nursing 2 x a week for 4 weeks and 1 x a week until discharged 12/31/15. Also needs orders for basic wound care for skin care and continued monitoring of skin care.

## 2015-10-29 NOTE — Telephone Encounter (Signed)
Left voicemail giving [name] the verbal orders she requested

## 2015-10-29 NOTE — Telephone Encounter (Signed)
Please ok those verbal orders  

## 2015-11-02 ENCOUNTER — Telehealth (HOSPITAL_COMMUNITY): Payer: Self-pay | Admitting: *Deleted

## 2015-11-02 ENCOUNTER — Other Ambulatory Visit (HOSPITAL_COMMUNITY): Payer: Self-pay | Admitting: *Deleted

## 2015-11-02 DIAGNOSIS — I4819 Other persistent atrial fibrillation: Secondary | ICD-10-CM

## 2015-11-02 NOTE — Telephone Encounter (Signed)
Patient's daughter, [name], called in stating patient's INR was in range at last check on 5/18 @ INR 2.4.  They are ready to pursue TEE with [facility].  I have scheduled this for Thursday, May 25th @ 12pm with Dr. Sallyanne Kuster.  Instructions for NPO after midnight except medications, arrival time reviewed. Patient will require INR and Potassium to be rechecked morning of procedure and orders were entered for this.  Follow up already in place for Dr. Johnsie Cancel 6/12.  Daughter questioning if coumadin will be long term - CHADVASC score >2 so lifelong therapy is required but option to switch to DOAC once recovery period from [facility] would be appropriate. They will discuss with Dr. Johnsie Cancel at follow up visit.

## 2015-11-03 ENCOUNTER — Ambulatory Visit (INDEPENDENT_AMBULATORY_CARE_PROVIDER_SITE_OTHER): Payer: Medicare Other | Admitting: *Deleted

## 2015-11-03 DIAGNOSIS — I4891 Unspecified atrial fibrillation: Secondary | ICD-10-CM

## 2015-11-03 NOTE — Progress Notes (Signed)
Pre visit review using our clinic review tool, if applicable. No additional management support is needed unless otherwise documented below in the visit note. 

## 2015-11-04 ENCOUNTER — Encounter (HOSPITAL_COMMUNITY)
Admission: RE | Disposition: A | Discharge status: Home / Self Care | Payer: Self-pay | Source: Ambulatory Visit | Attending: Cardiovascular Disease

## 2015-11-04 ENCOUNTER — Ambulatory Visit: Payer: Self-pay | Admitting: *Deleted

## 2015-11-04 ENCOUNTER — Ambulatory Visit (HOSPITAL_BASED_OUTPATIENT_CLINIC_OR_DEPARTMENT_OTHER): Payer: Medicare Other

## 2015-11-04 ENCOUNTER — Ambulatory Visit (HOSPITAL_COMMUNITY): Payer: Medicare Other | Admitting: Certified Registered Nurse Anesthetist

## 2015-11-04 ENCOUNTER — Encounter (HOSPITAL_COMMUNITY): Payer: Self-pay | Admitting: *Deleted

## 2015-11-04 ENCOUNTER — Ambulatory Visit (HOSPITAL_COMMUNITY)
Admission: RE | Admit: 2015-11-04 | Discharge: 2015-11-04 | Disposition: A | Discharge status: Home / Self Care | Payer: Medicare Other | Source: Ambulatory Visit | Attending: Cardiovascular Disease | Admitting: Cardiovascular Disease

## 2015-11-04 DIAGNOSIS — Z7952 Long term (current) use of systemic steroids: Secondary | ICD-10-CM | POA: Insufficient documentation

## 2015-11-04 DIAGNOSIS — M353 Polymyalgia rheumatica: Secondary | ICD-10-CM | POA: Insufficient documentation

## 2015-11-04 DIAGNOSIS — I110 Hypertensive heart disease with heart failure: Secondary | ICD-10-CM | POA: Insufficient documentation

## 2015-11-04 DIAGNOSIS — E785 Hyperlipidemia, unspecified: Secondary | ICD-10-CM | POA: Insufficient documentation

## 2015-11-04 DIAGNOSIS — I4819 Other persistent atrial fibrillation: Secondary | ICD-10-CM

## 2015-11-04 DIAGNOSIS — I4891 Unspecified atrial fibrillation: Secondary | ICD-10-CM

## 2015-11-04 DIAGNOSIS — E119 Type 2 diabetes mellitus without complications: Secondary | ICD-10-CM | POA: Diagnosis not present

## 2015-11-04 DIAGNOSIS — M069 Rheumatoid arthritis, unspecified: Secondary | ICD-10-CM | POA: Insufficient documentation

## 2015-11-04 DIAGNOSIS — I340 Nonrheumatic mitral (valve) insufficiency: Secondary | ICD-10-CM | POA: Diagnosis not present

## 2015-11-04 DIAGNOSIS — Z955 Presence of coronary angioplasty implant and graft: Secondary | ICD-10-CM | POA: Diagnosis not present

## 2015-11-04 DIAGNOSIS — I350 Nonrheumatic aortic (valve) stenosis: Secondary | ICD-10-CM | POA: Insufficient documentation

## 2015-11-04 DIAGNOSIS — I509 Heart failure, unspecified: Secondary | ICD-10-CM | POA: Diagnosis not present

## 2015-11-04 DIAGNOSIS — I2510 Atherosclerotic heart disease of native coronary artery without angina pectoris: Secondary | ICD-10-CM | POA: Diagnosis not present

## 2015-11-04 DIAGNOSIS — Z87891 Personal history of nicotine dependence: Secondary | ICD-10-CM | POA: Insufficient documentation

## 2015-11-04 DIAGNOSIS — Z7901 Long term (current) use of anticoagulants: Secondary | ICD-10-CM | POA: Insufficient documentation

## 2015-11-04 DIAGNOSIS — I252 Old myocardial infarction: Secondary | ICD-10-CM | POA: Insufficient documentation

## 2015-11-04 DIAGNOSIS — Z7984 Long term (current) use of oral hypoglycemic drugs: Secondary | ICD-10-CM | POA: Insufficient documentation

## 2015-11-04 DIAGNOSIS — I481 Persistent atrial fibrillation: Secondary | ICD-10-CM | POA: Diagnosis not present

## 2015-11-04 DIAGNOSIS — Z79899 Other long term (current) drug therapy: Secondary | ICD-10-CM | POA: Diagnosis not present

## 2015-11-04 DIAGNOSIS — Z8249 Family history of ischemic heart disease and other diseases of the circulatory system: Secondary | ICD-10-CM | POA: Diagnosis not present

## 2015-11-04 HISTORY — PX: TEE WITHOUT CARDIOVERSION: SHX5443

## 2015-11-04 HISTORY — PX: CARDIOVERSION: SHX1299

## 2015-11-04 LAB — BASIC METABOLIC PANEL
ANION GAP: 9 (ref 5–15)
BUN: 18 mg/dL (ref 6–20)
CALCIUM: 9.3 mg/dL (ref 8.9–10.3)
CO2: 20 mmol/L — AB (ref 22–32)
Chloride: 106 mmol/L (ref 101–111)
Creatinine, Ser: 1.49 mg/dL — ABNORMAL HIGH (ref 0.61–1.24)
GFR, EST AFRICAN AMERICAN: 51 mL/min — AB (ref >60)
GFR, EST NON AFRICAN AMERICAN: 44 mL/min — AB (ref >60)
Glucose, Bld: 242 mg/dL — ABNORMAL HIGH (ref 65–99)
Potassium: 4.3 mmol/L (ref 3.5–5.1)
Sodium: 135 mmol/L (ref 135–145)

## 2015-11-04 LAB — PROTIME-INR
INR: 2.05 — AB (ref 0.00–1.49)
Prothrombin Time: 23 seconds — ABNORMAL HIGH (ref 11.6–15.2)

## 2015-11-04 SURGERY — ECHOCARDIOGRAM, TRANSESOPHAGEAL
Anesthesia: Monitor Anesthesia Care

## 2015-11-04 MED ORDER — LIDOCAINE HCL (CARDIAC) 20 MG/ML IV SOLN
INTRAVENOUS | Status: DC | PRN
  Administered 2015-11-04: 30 mg via INTRATRACHEAL

## 2015-11-04 MED ORDER — SODIUM CHLORIDE 0.9 % IV SOLN: INTRAVENOUS | Status: DC

## 2015-11-04 MED ORDER — LACTATED RINGERS IV SOLN
INTRAVENOUS | Status: DC | PRN
  Administered 2015-11-04: 12:00:00 via INTRAVENOUS

## 2015-11-04 MED ORDER — PROPOFOL 10 MG/ML IV BOLUS
INTRAVENOUS | Status: DC | PRN
  Administered 2015-11-04: 20 mg via INTRAVENOUS
  Administered 2015-11-04 (×2): 30 mg via INTRAVENOUS
  Administered 2015-11-04: 20 mg via INTRAVENOUS
  Administered 2015-11-04: 50 mg via INTRAVENOUS

## 2015-11-04 MED ORDER — BUTAMBEN-TETRACAINE-BENZOCAINE 2-2-14 % EX AERO
INHALATION_SPRAY | CUTANEOUS | Status: DC | PRN
  Administered 2015-11-04: 2 via TOPICAL

## 2015-11-04 NOTE — Progress Notes (Signed)
  Echocardiogram Echocardiogram Transesophageal has been performed.  Joelene Millin 11/04/2015, 1:30 PM

## 2015-11-04 NOTE — Op Note (Signed)
INDICATIONS: atrial fibrillation precardioversion  PROCEDURE:   Informed consent was obtained prior to the procedure. The risks, benefits and alternatives for the procedure were discussed and the patient comprehended these risks.  Risks include, but are not limited to, cough, sore throat, vomiting, nausea, somnolence, esophageal and stomach trauma or perforation, bleeding, low blood pressure, aspiration, pneumonia, infection, trauma to the teeth and death.    After a procedural time-out, the oropharynx was anesthetized with 20% benzocaine spray.   During this procedure the patient was administered IV propofol (dr. Ola Spurr) to achieve and maintain moderate conscious sedation.  The patient's heart rate, blood pressure, and oxygen saturationweare monitored continuously during the procedure.   The transesophageal probe was inserted in the esophagus and stomach without difficulty and multiple views were obtained.  The patient was kept under observation until the patient left the procedure room.  The patient left the procedure room in stable condition.   Agitated microbubble saline contrast was not administered.  COMPLICATIONS:    There were no immediate complications.  FINDINGS:  No evidence of LA thrombus. Aortic stenosis, probably moderate. Normal left ventricular systolic function.  RECOMMENDATIONS:     Proceed with cardioversion  Time Spent Directly with the Patient:  30 minutes   Curtis Frazier 11/04/2015, 1:09 PM

## 2015-11-04 NOTE — Op Note (Signed)
Procedure: Electrical Cardioversion Indications:  Atrial Fibrillation  Procedure Details:  Consent: Risks of procedure as well as the alternatives and risks of each were explained to the (patient/caregiver).  Consent for procedure obtained.  Time Out: Verified patient identification, verified procedure, site/side was marked, verified correct patient position, special equipment/implants available, medications/allergies/relevent history reviewed, required imaging and test results available.  Performed  Patient placed on cardiac monitor, pulse oximetry, supplemental oxygen as necessary.  Sedation given: IV propofol (Dr. Ola Spurr) Pacer pads placed anterior and posterior chest.  Cardioverted 1 time(s).  Cardioversion with synchronized biphasic 120J shock.  Evaluation: Findings: Post procedure EKG shows: NSR Complications: None The patient tolerated the procedure well.  Time Spent Directly with the Patient:  30 minutes   Stanton Kissoon 11/04/2015, 1:13 PM

## 2015-11-04 NOTE — Transfer of Care (Signed)
Immediate Anesthesia Transfer of Care Note  Patient: Curtis Frazier  Procedure(s) Performed: Procedure(s): TRANSESOPHAGEAL ECHOCARDIOGRAM (TEE) (N/A) CARDIOVERSION (N/A)  Patient Location: PACU  Anesthesia Type:MAC  Level of Consciousness: awake, alert  and oriented  Airway & Oxygen Therapy: Patient Spontanous Breathing and Patient connected to nasal cannula oxygen  Post-op Assessment: Report given to RN, Post -op Vital signs reviewed and stable and Patient moving all extremities X 4  Post vital signs: Reviewed and stable  Last Vitals:  Filed Vitals:   11/04/15 1051 11/04/15 1322  BP: 106/66 119/71  Pulse: 105   Resp: 20 20    Last Pain: There were no vitals filed for this visit.       Complications: No apparent anesthesia complications

## 2015-11-04 NOTE — Anesthesia Postprocedure Evaluation (Signed)
Anesthesia Post Note  Patient: Curtis Frazier  Procedure(s) Performed: Procedure(s) (LRB): TRANSESOPHAGEAL ECHOCARDIOGRAM (TEE) (N/A) CARDIOVERSION (N/A)  Patient location during evaluation: PACU Anesthesia Type: General Level of consciousness: awake and alert Pain management: pain level controlled Vital Signs Assessment: post-procedure vital signs reviewed and stable Respiratory status: spontaneous breathing, nonlabored ventilation and respiratory function stable Cardiovascular status: blood pressure returned to baseline and stable Postop Assessment: no signs of nausea or vomiting Anesthetic complications: no    Last Vitals:  Filed Vitals:   11/04/15 1340 11/04/15 1350  BP: 132/76   Pulse: 80 86  Temp:    Resp: 21 19    Last Pain: There were no vitals filed for this visit.               [name]

## 2015-11-04 NOTE — H&P (View-Only) (Signed)
Patient ID: Curtis Frazier, male   DOB: [birth year], 75 y.o.   MRN: [id number]     Primary Care Physician: Loura Pardon, MD Referring Physician: [facility] f/u Cardiologist: Dr. Tamala Fothergill is a 75 y.o. male with  history of coronary artery disease,  Afib, RA, lumbar stenosis with outpatient lumbar diskectomy in 2017. He was discharged to home but readmitted on February 4, with sepsis due to PNE, mental status changes as well as AFib with RVR. Hospital course was complicated by VDRL, acute on chronic CHF as well as AFib with RVR requiring cardioversion. He was noted to be hypoxic and severely deconditioned, had issues with confusion, and encephalopathy and was transferred to [facility] on February 21. He was readmitted to acute hospital on February 28 with ongoing encephalopathy, worsening of lethargy, and poor p.o. intake. He was started on IV fluids for acute on chronic renal failure with hyponatremia and for management of hypotension and sepsis. MRI of brain done showed no acute changes. EEG showed changes consistent with metabolic derangement. The patient has had issues with orthostatic changes and BP meds were held and he was treated with stress dose steriods. Acute renal failure had resolved with IV fluid hydration.He was further treated in CIR.  He was readmitted 3/26 for chest pain,EKG without acute ST T wave changes, CTA negative for PE or dissection Myoview 11/16: negative for ischemia, ruled out for ACS by negative enzymes, Cardiology Dr.Skains consulted, no further workup recommended at this time, he is advised to FU with Dr.Nishan and if he has recurrence of symptoms LHC could be considered, not advisable at this time especially with CKD3. -P,Afib: INR subtherapeutic, given additional lovenox today, advised higher dose of warfarin for 2days and then repeat INR in 3days.  Readmitted 3/28 to 4/6 for significant epigastric chest pain radiating up between the shoulder  blades. In further clarification with the patient Saturday night into Sunday morning patient had significant indigestion with pain radiating into the back that lasted several hours through the night. He has not noticed any similar symptoms after eating. Prior to most recent hospitalizations he not had any trouble with this type of chest discomfort. He did not have any shortness of breath or diaphoresis with the chest pain. He has been constipated from iron pills and did well while receiving stool softeners in the hospital but did not continue these after discharge. Of note patient had been on prednisone 5 mg daily prior to the February admission. At time of discharge he was discharged on 10 mg with instructions to eventually decrease to 5 mg daily and to notify either his PCP or his rheumatologist if he develops "any trouble". The daughter states that since previous discharge on last Thursday 3/23 patient has not had any prednisone because he did not have a refill and did not have any pills at the house. His only other symptoms have been pleuritic chest pain with taking a deep breath without productive cough.-- Chads Vasc score at least 4- on coumadin - Patient was on amiodarone and the dose was changed from 200 mg daily to 400 mg daily. In addition, cardizem was added but unable to achieve HR less than 100 - S/P TEE 4/4 with successfully cardioversion that failed on 4/5 PM - Will continue Cardizem 240 mg daily and amiodarone 400 mg BID- per cards recommendations  He was then admitted to [facility] 4/14 thru 4/15 for dehydration, lasix was stopped and received Iv hydration. Pt states that he  got a call from some one local and told him his labs were abnormal and he needed to go to the ER. He decided to go to [facility] for a second opinion.  He is now in afib clinic for to be scheduled for cardioversion. He has been therapeutic since 3/30 but was thin on last check and coumadin was adjusted. He is advised when he is  therapeutic x 3 weeks he can be scheduled for cardioversion. He continues on amiodrone at 200 mg bid. He is feeling better but still has fatigue. His daughter states that she witnessed her father having apnea episodes when she sat with him during hospitalizations. Will schedule sleep study. INR pending today and has INR's followed at [facility].  Returns to afib clinic 5/12, he has developed bronchitis  since here last and coumadin clinic([place]) decreased his coumadin dose by 50% when antibiotics started by PCP. Last INR there was 1.5, after antibiotics, and coumadin was increased. He states he is feeling better and breathing improved since treatment of his bronchitis. I discussed cardioversion with Gay Filler, PharmD and she said if pt did not want to drag cardioversion out several more weeks to get therapeutic, then cardioversion could be done  if INR was above 2.0 at time of procedure and TEE was done . Pt/duaghter agree to this approach and will get labs today. If labs are acceptable will proceed with this plan. Pt states he did forget coumadin a couple of times over the last couple of weeks.  Today, he denies symptoms of palpitations, chest pain, shortness of breath, orthopnea, PND, lower extremity edema, dizziness, presyncope, syncope, or neurologic sequela. The patient is tolerating medications without difficulties and is otherwise without complaint today.   Past Medical History  Diagnosis Date  . Other and unspecified hyperlipidemia   . Coronary atherosclerosis of unspecified type of vessel, native or graft   . Personal history of colonic polyps   . Actinic keratosis   . Psoriatic arthropathy ([place])   . Other psoriasis   . Scoliosis (and kyphoscoliosis), idiopathic   . GI bleed 12/10    cecam AVM  . Gastritis 12/10  . Hyperlipidemia     takes Pravastatin daily  . Hypertension     takes Metoprolol daily  . Shortness of breath     with exertion  . Headache(784.0)     occasionally    . Joint pain   . Chronic back pain     scoliosis/stenosis/radiculopathy,degenerative disc disease  . Psoriasis   . Bruises easily   . Esophageal reflux     takes Omeprazole daily  . History of colonic polyps   . Hemorrhoids   . Kidney stone     hx of  . Blood transfusion 2010  . PMR (polymyalgia rheumatica) ([facility]) 01/20/2012    tx by specialist on a long prednisone taper  . S/P PTCA (percutaneous transluminal coronary angioplasty)   . Rheumatoid arthritis(714.0)     takes Methotrexate 7pills weekly  . Degeneration of intervertebral disc, site unspecified   . Myocardial infarction ([place]) 2010  . Pneumonia 2008; 07/2015  . Type II or unspecified type diabetes mellitus without mention of complication, not stated as uncontrolled   . Skin cancer     "arms"  . CHF (congestive heart failure) [facility])    Past Surgical History  Procedure Laterality Date  . Rotator cuff repair Left 2000  . Colonoscopy    . Vasectomy  1979  . Coronary angioplasty with stent placement  02/2009    has one stent  . Carotid doppler  10/12    0-39% R and 60-79% left   . Cataract extraction  2012  . Cardiac catheterization  2010  . Lithotripsy  2009  . Tee without cardioversion N/A 08/06/2015    Procedure: TRANSESOPHAGEAL ECHOCARDIOGRAM (TEE);  Surgeon: Skeet Latch, MD;  Location: [place];  Service: Cardiovascular;  Laterality: N/A;  . Cardioversion N/A 08/06/2015    Procedure: CARDIOVERSION;  Surgeon: Skeet Latch, MD;  Location: [facility] ENDOSCOPY;  Service: Cardiovascular;  Laterality: N/A;  . Back surgery    . Lumbar laminectomy  07/2015  . Posterior laminectomy thoracic spine      "fixed the discs"  . Skin cancer excision      "arms"  . Tee without cardioversion N/A 09/14/2015    Procedure: TRANSESOPHAGEAL ECHOCARDIOGRAM (TEE);  Surgeon: Jerline Pain, MD;  Location: [place];  Service: Cardiovascular;  Laterality: N/A;  . Cardioversion N/A 09/14/2015    Procedure: CARDIOVERSION;  Surgeon: Jerline Pain, MD;  Location: [place];  Service: Cardiovascular;  Laterality: N/A;    Current Outpatient Prescriptions  Medication Sig Dispense Refill  . acetaminophen (TYLENOL) 325 MG tablet Take 650 mg by mouth every 6 (six) hours as needed (Pain scale of 1-4 or temp > 101).    Marland Kitchen albuterol (PROVENTIL HFA;VENTOLIN HFA) 108 (90 Base) MCG/ACT inhaler Inhale 2 puffs into the lungs every 6 (six) hours as needed for wheezing or shortness of breath. 1 Inhaler 0  . amiodarone (PACERONE) 200 MG tablet Take 200mg  by mouth twice daily until 5/4 then decrease to 200mg  (1 tablet) daily (Patient taking differently: Take 200 mg by mouth daily. Take 200mg  by mouth twice daily until 5/4 then decrease to 200mg  (1 tablet) daily) 30 tablet 6  . calcium-vitamin D (OSCAL WITH D) 500-200 MG-UNIT tablet Take 1 tablet by mouth daily with breakfast.    . diltiazem (CARDIZEM CD) 240 MG 24 hr capsule TAKE 1 CAPSULE BY MOUTH DAILY 30 capsule 5  . docusate sodium (COLACE) 100 MG capsule Take 100 mg by mouth 2 (two) times daily. Reported on 09/22/2015    . ferrous sulfate 325 (65 FE) MG tablet Take 1 tablet (325 mg total) by mouth 3 (three) times daily with meals. 90 tablet 0  . glipiZIDE (GLUCOTROL) 10 MG tablet Take 1 tablet (10 mg total) by mouth 2 (two) times daily before a meal. 60 tablet 3  . glucose blood (ONE TOUCH ULTRA TEST) test strip Check blood sugar once daily and as directed. Dx E11.8 100 each 3  . pantoprazole (PROTONIX) 40 MG tablet Take 1 tablet (40 mg total) by mouth daily. 30 tablet 5  . predniSONE (DELTASONE) 10 MG tablet Take 1 tablet (10 mg total) by mouth daily with breakfast. 30 tablet 0  . thiamine 100 MG tablet Take 1 tablet (100 mg total) by mouth daily. 30 tablet 5  . warfarin (COUMADIN) 2.5 MG tablet Take as directed by anti-coagulation clinic. 90 tablet 0  . furosemide (LASIX) 20 MG tablet TAKE 1 TABLET BY MOUTH DAILY (Patient not taking: Reported on 10/21/2015) 30 tablet 5   No current  facility-administered medications for this encounter.    Allergies  Allergen Reactions  . Ace Inhibitors Other (See Comments)    Causes high K Causes high K  . Angiotensin Receptor Blockers Other (See Comments)    Causes high K Causes high K  . Metoprolol Other (See Comments)    May cause elevated  K level May cause elevated K level  . Penicillins Swelling and Rash    Has patient had a PCN reaction causing immediate rash, facial/tongue/throat swelling, SOB or lightheadedness with hypotension: YES Has patient had a PCN reaction causing severe rash involving mucus membranes or skin necrosis: NO Has patient had a PCN reaction that required hospitalization NO Has patient had a PCN reaction occurring within the last 10 years: NO If all of the above answers are "NO", then may proceed with Cephalosporin use. Has patient had a PCN reaction causing immediate rash, facial/tongue/throat swelling, SOB or lightheadedness with hypotension: YES Has patient had a PCN reaction causing severe rash involving mucus membranes or skin necrosis: NO Has patient had a PCN reaction that required hospitalization NO Has patient had a PCN reaction occurring within the last 10 years: NO If all of the above answers are "NO", then may proceed with Cephalosporin use.  . Tetracycline Swelling and Rash    Social History   Social History  . Marital Status: Married    Spouse Name: N/A  . Number of Children: N/A  . Years of Education: N/A   Occupational History  . Not on file.   Social History Main Topics  . Smoking status: Former Smoker -- 1.50 packs/day for 53 years    Types: Cigarettes  . Smokeless tobacco: Never Used     Comment: quit smoking cigarettes in 2010  . Alcohol Use: 0.0 oz/week    0 Standard drinks or equivalent per week     Comment: 08/11/2015 "might have 1-2 margaritas/month in the summertime"  . Drug Use: No  . Sexual Activity: No   Other Topics Concern  . Not on file   Social History  Narrative    Family History  Problem Relation Age of Onset  . Coronary artery disease Mother   . Diabetes Mother   . CAD Mother   . Coronary artery disease Sister   . Hypertension Sister   . Kidney failure Sister   . CAD Sister   . Diabetes Brother   . Prostate cancer Brother   . Pancreatic cancer Brother   . Diabetes Brother   . Anesthesia problems Neg Hx   . Hypotension Neg Hx   . Malignant hyperthermia Neg Hx   . Pseudochol deficiency Neg Hx     ROS- All systems are reviewed and negative except as per the HPI above  Physical Exam: Filed Vitals:   10/22/15 0923  BP: 120/64  Pulse: 101  Height: 5\' 8"  (1.727 m)  Weight: 165 lb 3.2 oz (74.934 kg)    GEN- The patient is chronically ill appearing, alert and oriented x 3 today.   Head- normocephalic, atraumatic Eyes-  Sclera clear, conjunctiva pink Ears- hearing intact Oropharynx- clear Neck- supple, no JVP Lymph- no cervical lymphadenopathy Lungs- Clear to ausculation bilaterally, normal work of breathing Heart- irregular rate and rhythm, no murmurs, rubs or gallops, PMI not laterally displaced GI- soft, NT, ND, + BS Extremities- no clubbing, cyanosis, or edema MS- no significant deformity or atrophy Skin- no rash or lesion Psych- euthymic mood, full affect Neuro- strength and sensation are intact  EKG-Aflutter with variable av block, 101 bpm, LAD, Prolonged qtc at 534 ms, Programmed EKG  reading Acute MI, probably due to flutter waves, clinically does not fit presentation. Epic records reviewed   Assessment and Plan: 1. Persistent Afib Continues on amiodarone 200 qd, unfortunately INR today is at 1.25 and he will need to be at least 2.0  or above prior to TEE/cardioversion. Notified [facility] office and spoke to the NP there in lieu of Dr. Alba Cory absence. She was informed of the sub therapeutic INR and Kidney function shows  increased Creat/bun/kt, only med difference was recent antibiotic which he has now  finished. She will talk to Dr. Glori Bickers to try aggressively to increase INR to at least 2.0 and will repeat bmet on Monday. He may be an candidate for DOAC if INR's continue to be subtherapeutic.  Once INR's have stabilized, will schedule for cardioversion.  Geroge Baseman [place], [facility] [address] [place], [place] [address] [phone]

## 2015-11-04 NOTE — Interval H&P Note (Signed)
History and Physical Interval Note:  11/04/2015 10:45 AM  Curtis Frazier  has presented today for surgery, with the diagnosis of AFIB  The various methods of treatment have been discussed with the patient and family. After consideration of risks, benefits and other options for treatment, the patient has consented to  Procedure(s): TRANSESOPHAGEAL ECHOCARDIOGRAM (TEE) (N/A) CARDIOVERSION (N/A) as a surgical intervention .  The patient's history has been reviewed, patient examined, no change in status, stable for surgery.  I have reviewed the patient's chart and labs.  Questions were answered to the patient's satisfaction.     [name]

## 2015-11-04 NOTE — Discharge Instructions (Signed)

## 2015-11-04 NOTE — Anesthesia Preprocedure Evaluation (Signed)
Anesthesia Evaluation  Patient identified by MRN, date of birth, ID band Patient awake    Reviewed: Allergy & Precautions, NPO status , Patient's Chart, lab work & pertinent test results  Airway Mallampati: II  TM Distance: >3 FB Neck ROM: Full    Dental no notable dental hx.    Pulmonary neg pulmonary ROS, former smoker,    Pulmonary exam normal breath sounds clear to auscultation       Cardiovascular hypertension, Pt. on medications and Pt. on home beta blockers + CAD and + Past MI   Rhythm:Irregular Rate:Normal  - Left ventricle: Systolic function was normal. The estimated  ejection fraction was in the range of 55% to 60%. No evidence of  thrombus. - Mitral valve: Moderately calcified annulus. There was mild  regurgitation. - Left atrium: No evidence of thrombus in the atrial cavity or  appendage. No evidence of thrombus in the atrial cavity or  appendage. - Atrial septum: There was increased thickness of the septum,  consistent with lipomatous hypertrophy. No defect or patent  foramen ovale was identified by color flow Doppler.   Neuro/Psych negative neurological ROS  negative psych ROS   GI/Hepatic negative GI ROS, Neg liver ROS,   Endo/Other  diabetes  Renal/GU negative Renal ROS  negative genitourinary   Musculoskeletal  (+) Arthritis , Rheumatoid disorders,    Abdominal   Peds negative pediatric ROS (+)  Hematology negative hematology ROS (+)   Anesthesia Other Findings   Reproductive/Obstetrics negative OB ROS                             Anesthesia Physical  Anesthesia Plan  ASA: III  Anesthesia Plan: MAC   Post-op Pain Management:    Induction: Intravenous  Airway Management Planned: Nasal Cannula  Additional Equipment:   Intra-op Plan:   Post-operative Plan:   Informed Consent: I have reviewed the patients History and Physical, chart, labs and  discussed the procedure including the risks, benefits and alternatives for the proposed anesthesia with the patient or authorized representative who has indicated his/her understanding and acceptance.   Dental advisory given  Plan Discussed with: CRNA and Surgeon  Anesthesia Plan Comments:         Anesthesia Quick Evaluation

## 2015-11-05 ENCOUNTER — Other Ambulatory Visit (HOSPITAL_COMMUNITY): Payer: Medicare Other

## 2015-11-05 ENCOUNTER — Telehealth: Payer: Self-pay | Admitting: Family Medicine

## 2015-11-05 ENCOUNTER — Encounter (HOSPITAL_COMMUNITY): Payer: Self-pay | Admitting: Cardiovascular Disease

## 2015-11-05 ENCOUNTER — Other Ambulatory Visit: Payer: Self-pay | Admitting: *Deleted

## 2015-11-05 NOTE — Telephone Encounter (Signed)
Patient Name: Curtis Frazier  DOB: 11/17/1940    Initial Comment Caller States he is having chest pain, had his heart shockes yeterday   Nurse Assessment  Nurse: Mallie Mussel, RN, Alveta Heimlich Date/Time (Eastern Time): 11/05/2015 8:14:23 AM  Confirm and document reason for call. If symptomatic, describe symptoms. You must click the next button to save text entered. ---Caller states that he had his heart shocked yesterday due to a flutter. He has chest pain which began during the night. He had to get up to go to the br and that is when he felt the pain. It began as a stabbing pain, but now, it is a dull pain. Denies difficulty breathing. His heart rate is 97.  Has the patient traveled out of the country within the last 30 days? ---No  Does the patient have any new or worsening symptoms? ---Yes  Will a triage be completed? ---Yes  Related visit to physician within the last 2 weeks? ---No  Does the PT have any chronic conditions? (i.e. diabetes, asthma, etc.) ---Yes  List chronic conditions. ---AFib, Diabetes  Is this a behavioral health or substance abuse call? ---No     Guidelines    Guideline Title Affirmed Question Affirmed Notes  Chest Pain [1] Chest pain lasts > 5 minutes AND [2] history of heart disease (i.e., heart attack, bypass surgery, angina, angioplasty, CHF; not just a heart murmur)    Final Disposition User   Call EMS 911 Now Mallie Mussel, RN, Alveta Heimlich    Comments  I called the backline and spoke with Vaughan Basta. She transferred me to [facility]. Report given to [facility]. Verbalized understanding.   Referrals  GO TO FACILITY REFUSED   Disagree/Comply: Disagree  Disagree/Comply Reason: Disagree with instructions

## 2015-11-05 NOTE — Telephone Encounter (Signed)
I was not aware he had uri symptoms  -if no appt avail today here please set up for sat clinic  I will see him next wk regardless  Make sure pt let cardiology know about cp last night (since he was just cardioverted)-they need to be aware

## 2015-11-05 NOTE — Telephone Encounter (Signed)
Returned call to [place] at [place].  They will be checking INR at home - [name] calling for dose and instructions.  Patient is currently taking 2.5 mg on Monday, Wednesday, and Friday and 2.5 mg the rest of the week.  He is due to be rechecked on 6/1.  They will call with results.

## 2015-11-05 NOTE — Telephone Encounter (Signed)
[name] from [facility] called , is requesting cb from you  Please cb at [phone] Thank you

## 2015-11-05 NOTE — Patient Outreach (Signed)
[facility]) Care Management  [place]  11/05/2015   Curtis Frazier [birth year] [id number]  Transition of care call  Subjective:  Curtis Frazier discussed his episode of chest pain earlier today, and phone call to MD office'  Patient denies chest pain at this time, he complains of "stuffy chest cold", cough with little yellow sputum and no fever. Patient discussed his concern regarding cost of ambulance trip to hosptial.    Encounter Medications:  Outpatient Encounter Prescriptions as of 11/05/2015  Medication Sig  . acetaminophen (TYLENOL) 325 MG tablet Take 650 mg by mouth every 6 (six) hours as needed (Pain scale of 1-4 or temp > 101).  Marland Kitchen albuterol (PROVENTIL HFA;VENTOLIN HFA) 108 (90 Base) MCG/ACT inhaler Inhale 2 puffs into the lungs every 6 (six) hours as needed for wheezing or shortness of breath.  Marland Kitchen amiodarone (PACERONE) 200 MG tablet Take 225m by mouth twice daily until 5/4 then decrease to 2073m(1 tablet) daily (Patient taking differently: Take 200 mg by mouth daily. Take 20025my mouth twice daily until 5/4 then decrease to 200m37m tablet) daily)  . calcium-vitamin D (OSCAL WITH D) 500-200 MG-UNIT tablet Take 1 tablet by mouth daily with breakfast.  . diltiazem (CARDIZEM CD) 240 MG 24 hr capsule TAKE 1 CAPSULE BY MOUTH DAILY  . docusate sodium (COLACE) 100 MG capsule Take 100 mg by mouth 2 (two) times daily. Reported on 09/22/2015  . ferrous sulfate 325 (65 FE) MG tablet Take 1 tablet (325 mg total) by mouth 3 (three) times daily with meals.  . furosemide (LASIX) 20 MG tablet TAKE 1 TABLET BY MOUTH DAILY  . glipiZIDE (GLUCOTROL) 10 MG tablet Take 1 tablet (10 mg total) by mouth 2 (two) times daily before a meal.  . glucose blood (ONE TOUCH ULTRA TEST) test strip Check blood sugar once daily and as directed. Dx E11.8  . pantoprazole (PROTONIX) 40 MG tablet Take 1 tablet (40 mg total) by mouth daily.  . predniSONE (DELTASONE) 10 MG tablet Take 1 tablet (10 mg  total) by mouth daily with breakfast.  . thiamine 100 MG tablet Take 1 tablet (100 mg total) by mouth daily.  . waMarland Kitchenfarin (COUMADIN) 2.5 MG tablet Take as directed by anti-coagulation clinic.   No facility-administered encounter medications on file as of 11/05/2015.    Functional Status:  In your present state of health, do you have any difficulty performing the following activities: 10/13/2015 09/08/2015  Hearing? N -  Vision? N -  Difficulty concentrating or making decisions? N -  Walking or climbing stairs? Y -  Dressing or bathing? N -  Doing errands, shopping? Y Y [place] [place] Food and eating ? N -  Using the Toilet? N -  In the past six months, have you accidently leaked urine? Y -  Do you have problems with loss of bowel control? N -  Managing your Medications? Y -  Managing your Finances? Y -  Housekeeping or managing your Housekeeping? N -    Fall/Depression Screening: PHQ 2/9 Scores 10/04/2015 06/28/2015 11/23/2014 07/07/2013 07/05/2012  PHQ - 2 Score 0 0 0 0 0    Assessment:   Chest Pain Patient denies chest pain at present, educated patient in the importance of urgent follow up regarding chest pain. Patient states he has nitroglycerin but didn't did  to use it , patient able to teachback how to use nitroglycerin. Curtis Frazier reports his breathing is okay , he monitors his vital signs at home, reports 105/62,  heart rate 97, and oxygen saturation 96 %.   Plan:  Patient will seek urgent medical attention for chest pain and new or worsening symptoms  RN will schedule community care management visit in 2 weeks, evaluate for further needs,  prior to possible transition to health coach.   THN CM Care Plan Problem One        Most Recent Value   Care Plan Problem One  Recent Hospitalization with Atrial fibrillation   Role Documenting the Problem One  Care Management Coordinator   Care Plan for Problem One  Active   THN Long Term Goal (31-90 days)  Patient will not readmit to  hospital in the next 31 days   THN Long Term Goal Start Date  09/27/15 [name] restarted due to admission at [facility] on 4/13-4/15]   [facility] Long Term Goal Met Date  11/05/15   THN CM Short Term Goal #1 (0-30 days)  Patient will attend PCP appointment in the next 7 days   THN CM Short Term Goal #1 Start Date  09/27/15 [date restarted. ]   THN CM Short Term Goal #1 Met Date  10/04/15   THN CM Short Term Goal #2 (0-30 days)  Patient/caregiver  will purchase scales and begin to weigh daily and record in the next 30 days.   THN CM Short Term Goal #2 Start Date  09/27/15 [name] restarted due to admission]   THN CM Short Term Goal #2 Met Date  -- [patient has scales from [facility]]   Interventions for Short Term Goal #2  Patient has scales to continue to use after home health completed.   THN CM Short Term Goal #3 (0-30 days)  Patient will report home health being active in the next 7 days.   THN CM Short Term Goal #3 Start Date  09/27/15   THN CM Short Term Goal #3 Met Date  10/04/15   THN CM Short Term Goal #4 (0-30 days)  Patient will monitor and record blood sugars three times daily in the next 30 days [goal adjusted ]   THN CM Short Term Goal #4 Start Date  10/04/15   [facility] CM Short Term Goal #4 Met Date  10/21/15   THN CM Short Term Goal #5 (0-30 days)  Patient will be able to report at least 3 signs of heart failure symptoms to notify MD of .in the next 30 days    THN CM Short Term Goal #5 Start Date  10/13/15   [facility] CM Short Term Goal #5 Met Date  10/28/15    [facility] CM Care Plan Problem Two        Most Recent Value   Care Plan Problem Two  Atrial Fibrillation self care management   Role Documenting the Problem Two  Care Management Coordinator   Care Plan for Problem Two  Active   Interventions for Problem Two Long Term Goal   -- [patient reports that he is now in atrial flutter]   THN Long Term Goal (31-90) days  Patient will report increased knowledge self care management of atrial  fibrillation in the nexrt 20 days   THN Long Term Goal Start Date  10/21/15   [facility] Long Term Goal Met Date  10/28/15   THN CM Short Term Goal #1 (0-30 days)  Patient will be able to state diet recommendations and follow  while taking coumadin in the next 14 days   THN CM Short Term Goal #1 Start Date  10/21/15  THN CM Short Term Goal #1 Met Date   11/05/15   THN CM Short Term Goal #2 (0-30 days)  Patient will continue with daily exercise as taught by physical therapy in the next 14 days   THN CM Short Term Goal #2 Start Date  10/21/15   [facility] CM Short Term Goal #2 Met Date  11/05/15   THN CM Short Term Goal #4 (0-30 days)  Patient will be able verbalize action plan for chest pain in the next 20 days    THN CM Short Term Goal #4 Start Date  11/05/15   Interventions of Short Term Goal #4  RN reviewed use of nitroglycerin with patient by teach back, reinforced education of importance of seeking  immediate medical  attention for chest pain with rationale

## 2015-11-05 NOTE — Telephone Encounter (Signed)
Called patient back about his message. Patient had cardioversion yesterday with Dr. Sallyanne Kuster. Patient denies chest pain at this time. Patient states his chest pain is gone. Patient also stated that he has had a chest cold with some congestion. Patient's BP 110/60 and HR 91. Patient feels that he is fine to wait for his appointment next week. Patient has refused to go to ED. Encouraged patient to call our office if his symptoms return and/or get worse. Patient verbalized understanding. Will forward to Dr. Johnsie Cancel and Roderic Palau NP with A. Fib clinic for any further advisement.

## 2015-11-05 NOTE — Telephone Encounter (Signed)
Pt just thinks he has a "little cold" and he said he can tell it's getting better so declined to schedule an appt. I advise pt if he wakes up tomorrow and there is no improvement in sxs or his sxs are worsening to call the office and schedule an appt with [facility], pt verbalized understanding

## 2015-11-05 NOTE — Telephone Encounter (Signed)
I spoke with pt; pt had cardioversion 11/04/15; started with CP last night and now pain is almost gone but pt wants to see Dr Glori Bickers; pt refuses to go to ED, pt states "cannot afford another ED visit". Spoke with Dr Glori Bickers and she advised pt to contact [facility]. I transferred call to [facility] in [place]; spoke with [place] and was advised to send note to [facility]. FYI to Dr Glori Bickers.

## 2015-11-09 ENCOUNTER — Telehealth: Payer: Self-pay | Admitting: Family Medicine

## 2015-11-09 ENCOUNTER — Telehealth (HOSPITAL_COMMUNITY): Payer: Self-pay | Admitting: *Deleted

## 2015-11-09 NOTE — Telephone Encounter (Signed)
Aware-will watch for ED notes 

## 2015-11-09 NOTE — Telephone Encounter (Signed)
Patient's daughter, [name], called in stating her dad is being seen by [facility] this morning and they noted his HR being above 100 around 105-110. Patient is asymptomatic no chest pain or increased shortness of breath and were wondering what to do as the PCP is recommending ER.  Pts daughter confirms patient is having no symptoms but are concerned he has returned to afib.  Patient has appointment with NP 5/31 at 1:30pm.  Discussed reasons to go to ER with daughter and daughter verbalized understanding of those.

## 2015-11-09 NOTE — Telephone Encounter (Signed)
Patient Name: Curtis Frazier DOB: 08-Mar-1941 Initial Comment Caller states she works at [employer]: Vallery Sa, RN, [place] Date/Time (Eastern Time): 11/09/2015 10:23:59 AM Confirm and document reason for call. If symptomatic, describe symptoms. You must click the next button to save text entered. ---Caller states Lani developed chest pain 3 days ago (rated as a 6/7 on the 1 to 10 scale). He had a cardio conversion last Thursday. No severe breathing difficulty. Alert and responsive. Has the patient traveled out of the country within the last 30 days? ---No Does the patient have any new or worsening symptoms? ---Yes Will a triage be completed? ---Yes Related visit to physician within the last 2 weeks? ---Yes Does the PT have any chronic conditions? (i.e. diabetes, asthma, etc.) ---Yes List chronic conditions. ---Cardiac Conversion last Thursday, Kidney Disease, Diabetes, CHF, A-Fib Is this a behavioral health or substance abuse call? ---No Guidelines Guideline Title Affirmed Question Affirmed Notes Chest Pain [1] Chest pain lasts > 5 minutes AND [2] history of heart disease (i.e., heart attack, bypass surgery, angina, angioplasty, CHF; not just a heart murmur) Final Disposition User Call EMS 911 Now Vallery Sa, RN, [employer] declined the Call 911 disposition. Reinforced the Call 911 disposition. They plan to take him to [facility] ER now. Referrals GO TO FACILITY REFUSED Disagree/Comply: Disagree Disagree/Comply Reason: Disagree with instructions

## 2015-11-10 ENCOUNTER — Encounter: Payer: Self-pay | Admitting: Nurse Practitioner

## 2015-11-10 ENCOUNTER — Ambulatory Visit (INDEPENDENT_AMBULATORY_CARE_PROVIDER_SITE_OTHER): Payer: Medicare Other | Admitting: Family Medicine

## 2015-11-10 ENCOUNTER — Ambulatory Visit (INDEPENDENT_AMBULATORY_CARE_PROVIDER_SITE_OTHER): Payer: Medicare Other | Admitting: Nurse Practitioner

## 2015-11-10 ENCOUNTER — Encounter: Payer: Self-pay | Admitting: Family Medicine

## 2015-11-10 ENCOUNTER — Ambulatory Visit (INDEPENDENT_AMBULATORY_CARE_PROVIDER_SITE_OTHER)
Admission: RE | Admit: 2015-11-10 | Discharge: 2015-11-10 | Disposition: A | Discharge status: Home / Self Care | Payer: Medicare Other | Source: Ambulatory Visit | Attending: Family Medicine | Admitting: Family Medicine

## 2015-11-10 VITALS — BP 106/62 | HR 89 | Temp 98.7°F | Ht 67.0 in | Wt 166.5 lb

## 2015-11-10 VITALS — BP 100/68 | HR 120 | Ht 67.0 in | Wt 168.0 lb

## 2015-11-10 DIAGNOSIS — I483 Typical atrial flutter: Secondary | ICD-10-CM | POA: Diagnosis not present

## 2015-11-10 DIAGNOSIS — R058 Other specified cough: Secondary | ICD-10-CM

## 2015-11-10 DIAGNOSIS — I480 Paroxysmal atrial fibrillation: Secondary | ICD-10-CM

## 2015-11-10 DIAGNOSIS — I6523 Occlusion and stenosis of bilateral carotid arteries: Secondary | ICD-10-CM | POA: Diagnosis not present

## 2015-11-10 DIAGNOSIS — R05 Cough: Secondary | ICD-10-CM

## 2015-11-10 DIAGNOSIS — I5032 Chronic diastolic (congestive) heart failure: Secondary | ICD-10-CM | POA: Diagnosis not present

## 2015-11-10 DIAGNOSIS — I119 Hypertensive heart disease without heart failure: Secondary | ICD-10-CM | POA: Insufficient documentation

## 2015-11-10 DIAGNOSIS — N183 Chronic kidney disease, stage 3 unspecified: Secondary | ICD-10-CM

## 2015-11-10 DIAGNOSIS — E118 Type 2 diabetes mellitus with unspecified complications: Secondary | ICD-10-CM

## 2015-11-10 DIAGNOSIS — R059 Cough, unspecified: Secondary | ICD-10-CM

## 2015-11-10 DIAGNOSIS — I2510 Atherosclerotic heart disease of native coronary artery without angina pectoris: Secondary | ICD-10-CM

## 2015-11-10 DIAGNOSIS — I350 Nonrheumatic aortic (valve) stenosis: Secondary | ICD-10-CM | POA: Insufficient documentation

## 2015-11-10 DIAGNOSIS — E785 Hyperlipidemia, unspecified: Secondary | ICD-10-CM

## 2015-11-10 DIAGNOSIS — M069 Rheumatoid arthritis, unspecified: Secondary | ICD-10-CM

## 2015-11-10 LAB — CBC WITH DIFFERENTIAL/PLATELET
BASOS ABS: 0.1 10*3/uL (ref 0.0–0.1)
BASOS PCT: 0.6 % (ref 0.0–3.0)
EOS ABS: 0 10*3/uL (ref 0.0–0.7)
Eosinophils Relative: 0.3 % (ref 0.0–5.0)
HCT: 35.7 % — ABNORMAL LOW (ref 39.0–52.0)
Hemoglobin: 11.6 g/dL — ABNORMAL LOW (ref 13.0–17.0)
LYMPHS ABS: 1.3 10*3/uL (ref 0.7–4.0)
Lymphocytes Relative: 11.3 % — ABNORMAL LOW (ref 12.0–46.0)
MCHC: 32.5 g/dL (ref 30.0–36.0)
MCV: 90.2 fl (ref 78.0–100.0)
MONOS PCT: 6.2 % (ref 3.0–12.0)
Monocytes Absolute: 0.7 10*3/uL (ref 0.1–1.0)
NEUTROS ABS: 9.7 10*3/uL — AB (ref 1.4–7.7)
NEUTROS PCT: 81.6 % — AB (ref 43.0–77.0)
PLATELETS: 258 10*3/uL (ref 150.0–400.0)
RBC: 3.95 Mil/uL — ABNORMAL LOW (ref 4.22–5.81)
RDW: 18.2 % — AB (ref 11.5–15.5)
WBC: 11.9 10*3/uL — ABNORMAL HIGH (ref 4.0–10.5)

## 2015-11-10 LAB — COMPREHENSIVE METABOLIC PANEL
ALT: 14 U/L (ref 0–53)
AST: 15 U/L (ref 0–37)
Albumin: 3.5 g/dL (ref 3.5–5.2)
Alkaline Phosphatase: 61 U/L (ref 39–117)
BILIRUBIN TOTAL: 0.3 mg/dL (ref 0.2–1.2)
BUN: 27 mg/dL — ABNORMAL HIGH (ref 6–23)
CHLORIDE: 103 meq/L (ref 96–112)
CO2: 24 meq/L (ref 19–32)
Calcium: 9.3 mg/dL (ref 8.4–10.5)
Creatinine, Ser: 1.37 mg/dL (ref 0.40–1.50)
GFR: 53.65 mL/min — AB (ref >60.00)
GLUCOSE: 240 mg/dL — AB (ref 70–99)
Potassium: 4.2 mEq/L (ref 3.5–5.1)
Sodium: 135 mEq/L (ref 135–145)
Total Protein: 6.6 g/dL (ref 6.0–8.3)

## 2015-11-10 MED ORDER — ALBUTEROL SULFATE HFA 108 (90 BASE) MCG/ACT IN AERS: 2.0000 | INHALATION_SPRAY | Freq: Four times a day (QID) | RESPIRATORY_TRACT | Status: DC | PRN

## 2015-11-10 MED ORDER — PRAVASTATIN SODIUM 20 MG PO TABS: 20.0000 mg | ORAL_TABLET | Freq: Every evening | ORAL | Status: DC

## 2015-11-10 MED ORDER — AMIODARONE HCL 200 MG PO TABS: 200.0000 mg | ORAL_TABLET | Freq: Two times a day (BID) | ORAL | Status: DC

## 2015-11-10 MED ORDER — AMIODARONE HCL 200 MG PO TABS: 200.0000 mg | ORAL_TABLET | Freq: Every day | ORAL | Status: DC

## 2015-11-10 NOTE — Assessment & Plan Note (Signed)
Some cough with scant phlegm and upper airway sounds on exam  Also c/o chest soreness (does not think it is his heart) Suspect viral uri  cxr today in light of hx of smoking and pneumonia  Also rev last CT today

## 2015-11-10 NOTE — Progress Notes (Signed)
Pre visit review using our clinic review tool, if applicable. No additional management support is needed unless otherwise documented below in the visit note. 

## 2015-11-10 NOTE — Patient Instructions (Addendum)
Medication Instructions:  Your physician has recommended you make the following change in your medication:  1. Resume pravachol ( 20 mg ) daily 2 Increase Amiodarone ( 200 mg ) twice daily   Labwork: -None  Testing/Procedures: -None  Follow-Up: Your physician recommends that you keep your scheduled  follow-up appointment with Dr. Curt Bears  Your physician recommends that you keep your scheduled  follow-up appointment with Dr. Johnsie Cancel   Any Other Special Instructions Will Be Listed Below (If Applicable).     If you need a refill on your cardiac medications before your next appointment, please call your pharmacy.

## 2015-11-10 NOTE — Assessment & Plan Note (Signed)
Will f/u with rheumatology when he is cleared by cardiology On 10 mg prednisone-hopes to wean  Also due for remicade infusion  A lot of pain now esp in hips

## 2015-11-10 NOTE — Progress Notes (Signed)
Office Visit    Patient Name: Curtis Frazier Date of Encounter: 11/10/2015  Primary Care Provider:  Loura Pardon, MD Primary Cardiologist:  P. Johnsie Cancel, MD   Chief Complaint    75 y/o ? with a h/o CAD, HTN, HL, DM, polymyalgia rheumatica, and PAF s/p multiple DCCV's this year, who presents for f/u related to palpitations.  Past Medical History    Past Medical History  Diagnosis Date  . CAD (coronary artery disease)     a. 2010 s/p MI/Cath/PCI ([facility]): LM 10, LAD 30p, 7m, D1 20, D2 30, LCX 30p, 46m (3.5 x 15 Driver BMS), [id number], RCA 70p, 37m; b. 04/2011 Neg MV;  c. 04/2015 Myoview: very mild apical thinning, no ischemia, EF 65%.  . Hyperlipidemia     a. takes pravachol.  . Personal history of colonic polyps   . Actinic keratosis   . Psoriatic arthropathy ([place])   . Other psoriasis   . Scoliosis (and kyphoscoliosis), idiopathic   . GI bleed     a. 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  . Gastritis 12/10  . Hypertensive heart disease     a. takes Metoprolol daily  . Dyspnea on exertion   . Headache(784.0)     occasionally  . Joint pain   . Chronic back pain     scoliosis/stenosis/radiculopathy,degenerative disc disease  . Psoriasis   . Bruises easily   . Esophageal reflux     takes Omeprazole daily  . History of colonic polyps   . Hemorrhoids   . History of nephrolithiasis   . History of blood transfusion 2010  . PMR (polymyalgia rheumatica) ([facility]) 01/20/2012    tx by specialist on a long prednisone taper  . Rheumatoid arthritis(714.0)     takes Methotrexate 7pills weekly  . Degeneration of intervertebral disc, site unspecified   . History of pneumonia 2008; 07/2015  . Type II or unspecified type diabetes mellitus without mention of complication, not stated as uncontrolled   . Skin cancer     "arms"  . Chronic diastolic CHF (congestive heart failure) ([place])     a. 08/2015 Echo: EF 55-60%, mild LVH, no rwma, Gr1 DD, mod AS, mildly dil LA, PASP 31mmHg.  . Moderate aortic  stenosis     a. 08/2015 Mod AS, valve area (VTI) - 1.5cm^2, (Vmax) - 1.25cm^2; b. 10/2015 TEE EF 55-60%, mod AS.  Marland Kitchen Paroxysmal atrial fibrillation ([place])     a. 06/2011 Post-op AF-->converted on amio;  b. 07/2015 TEE/unsuccessful DCCV x 2; c. 09/2015 recurrent AF/Flutter-->successful DCCV; d. 09/2015 Recurrent AF-->10/2015 s/p TEE (EF 55-60%, mod AS, mild MR, sev dil LA w/o LAA thrombus, sev dil RA, mild to mod TR) & DCCV (120 J x 1).  . Lumbar stenosis     a. s/p lumbar diskectomy 2017.  . Carotid arterial disease ([place])     a. 10/2015 Carotid US: bilat 1-39% heterogeneous plaque/stenosis. Nl subclavian arteries bilat.  . CKD (chronic kidney disease), stage III     a. 09/2015 AKI in setting of dehydration - seen @ [facility].   Past Surgical History  Procedure Laterality Date  . Rotator cuff repair Left 2000  . Colonoscopy    . Vasectomy  1979  . Coronary angioplasty with stent placement  02/2009    has one stent  . Carotid doppler  10/12    0-39% R and 60-79% left   . Cataract extraction  2012  . Cardiac catheterization  2010  . Lithotripsy  2009  .  Tee without cardioversion N/A 08/06/2015    Procedure: TRANSESOPHAGEAL ECHOCARDIOGRAM (TEE);  Surgeon: Skeet Latch, MD;  Location: [place];  Service: Cardiovascular;  Laterality: N/A;  . Cardioversion N/A 08/06/2015    Procedure: CARDIOVERSION;  Surgeon: Skeet Latch, MD;  Location: [facility] ENDOSCOPY;  Service: Cardiovascular;  Laterality: N/A;  . Back surgery    . Lumbar laminectomy  07/2015  . Posterior laminectomy thoracic spine      "fixed the discs"  . Skin cancer excision      "arms"  . Tee without cardioversion N/A 09/14/2015    Procedure: TRANSESOPHAGEAL ECHOCARDIOGRAM (TEE);  Surgeon: Jerline Pain, MD;  Location: [place];  Service: Cardiovascular;  Laterality: N/A;  . Cardioversion N/A 09/14/2015    Procedure: CARDIOVERSION;  Surgeon: Jerline Pain, MD;  Location: [place];  Service: Cardiovascular;  Laterality: N/A;  . Tee  without cardioversion N/A 11/04/2015    Procedure: TRANSESOPHAGEAL ECHOCARDIOGRAM (TEE);  Surgeon: Sanda Klein, MD;  Location: [place];  Service: Cardiovascular;  Laterality: N/A;  . Cardioversion N/A 11/04/2015    Procedure: CARDIOVERSION;  Surgeon: Sanda Klein, MD;  Location: [facility] ENDOSCOPY;  Service: Cardiovascular;  Laterality: N/A;    Allergies  Allergies  Allergen Reactions  . Ace Inhibitors Other (See Comments)    Causes high K Causes high K  . Angiotensin Receptor Blockers Other (See Comments)    Causes high K Causes high K  . Metoprolol Other (See Comments)    May cause elevated K level May cause elevated K level  . Penicillins Swelling and Rash    Has patient had a PCN reaction causing immediate rash, facial/tongue/throat swelling, SOB or lightheadedness with hypotension: YES Has patient had a PCN reaction causing severe rash involving mucus membranes or skin necrosis: NO Has patient had a PCN reaction that required hospitalization NO Has patient had a PCN reaction occurring within the last 10 years: NO If all of the above answers are "NO", then may proceed with Cephalosporin use. Has patient had a PCN reaction causing immediate rash, facial/tongue/throat swelling, SOB or lightheadedness with hypotension: YES Has patient had a PCN reaction causing severe rash involving mucus membranes or skin necrosis: NO Has patient had a PCN reaction that required hospitalization NO Has patient had a PCN reaction occurring within the last 10 years: NO If all of the above answers are "NO", then may proceed with Cephalosporin use.  . Tetracycline Swelling and Rash    History of Present Illness    75 y/o ? with the above complex PMH including CAD s/p BMS of the LCX in 2010 (low risk myoview in 04/2015), GIB 2/2 AVM's in 2010, PMR on chronic steroids, HTN, HL, DM II, CKD III, chronic back pain, and PAF.  He has had multiple hospitalizations this year with lumbar laminectomy in [id number]  complicated by development of PNA/VDRF and Afib with unsuccessful DCCV on 08/06/2015, though he later converted to sinus rhythm on amio therapy.  He was readmitted in 08/2015 with atypical chest pain and mild troponin elevation. In light of non-ischemic MV in 04/2015 along with CKD III, medical Rx was recommended.  He was d/c'd and readmitted 3/30 with chest pain and recurrent AF/Flutter.  Amio was titrated to 400 mg daily and dilt was added.  He then underwent successful DCCV on 09/14/2015.  Following d/c, he developed dehydration, AKI, and leukocytosis noted on outpt labs.  He presented to [facility] for second opinion and was admitted.  ECG from 4/13 showed Aflutter with variable response.  His diuretics were held and he was treated for bronchitis.  He was later d/c'd in stable condition.  He f/u with primary care and later Afib clinic due to ongoing symptomatic afib/flutter and after an appropriate duration of oral anticoagulation, he underwent successful outpt TEE and DCCV on 11/04/2015.  Unfortunately, beginning on the morning of 5/26, he noted recurrent right sided pleuritic chest "congestion" and discomfort associated with a globus sensation, which he has had before in the setting of atrial flutter.  His HR's @ home have been running in the 110's to 120's.  He has not been noticing palpitations, dyspnea, presyncope, or syncope.  Home Medications    Prior to Admission medications   Medication Sig Start Date End Date Taking? Authorizing Provider  acetaminophen (TYLENOL) 325 MG tablet Take 650 mg by mouth every 6 (six) hours as needed (Pain scale of 1-4 or temp > 101).    Historical Provider, MD  albuterol (PROVENTIL HFA;VENTOLIN HFA) 108 (90 Base) MCG/ACT inhaler Inhale 2 puffs into the lungs every 6 (six) hours as needed for wheezing or shortness of breath. 11/10/15   Abner Greenspan, MD  amiodarone (PACERONE) 200 MG tablet Take 200mg  by mouth twice daily until 5/4 then decrease to 200mg  (1 tablet) daily Patient  taking differently: Take 200 mg by mouth daily. Take 200mg  by mouth twice daily until 5/4 then decrease to 200mg  (1 tablet) daily 10/07/15   Sherran Needs, NP  calcium-vitamin D (OSCAL WITH D) 500-200 MG-UNIT tablet Take 1 tablet by mouth daily with breakfast.    Historical Provider, MD  diltiazem (CARDIZEM CD) 240 MG 24 hr capsule TAKE 1 CAPSULE BY MOUTH DAILY 10/14/15   Abner Greenspan, MD  docusate sodium (COLACE) 100 MG capsule Take 100 mg by mouth 2 (two) times daily. Reported on 09/22/2015    Historical Provider, MD  ferrous sulfate 325 (65 FE) MG tablet Take 1 tablet (325 mg total) by mouth 3 (three) times daily with meals. 10/26/15   Abner Greenspan, MD  furosemide (LASIX) 20 MG tablet TAKE 1 TABLET BY MOUTH DAILY 10/14/15   Abner Greenspan, MD  glipiZIDE (GLUCOTROL) 10 MG tablet Take 1 tablet (10 mg total) by mouth 2 (two) times daily before a meal. 10/29/15   [place], MD  glucose blood (ONE TOUCH ULTRA TEST) test strip Check blood sugar once daily and as directed. Dx E11.8 10/14/15   Abner Greenspan, MD  pantoprazole (PROTONIX) 40 MG tablet Take 1 tablet (40 mg total) by mouth daily. 10/11/15   Tonia Ghent, MD  predniSONE (DELTASONE) 10 MG tablet Take 1 tablet (10 mg total) by mouth daily with breakfast. 10/21/15   Abner Greenspan, MD  thiamine 100 MG tablet Take 1 tablet (100 mg total) by mouth daily. 10/11/15   Tonia Ghent, MD  warfarin (COUMADIN) 2.5 MG tablet Take as directed by anti-coagulation clinic. 10/04/15   Abner Greenspan, MD    Review of Systems    As above, he has been noticing right sided chest "congestion" and pleuritic discomfort associated with a globus sensation.  He denies dyspnea, pnd, orthopnea, n, v, dizziness, syncope, edema, weight gain, or early satiety.  All other systems reviewed and are otherwise negative except as noted above.  Physical Exam    VS:  Ht 5\' 7"  (1.702 m)  Wt 168 lb (76.204 kg)  BMI 26.31 kg/m2  SpO2 94% , BMI Body mass index is 26.31 kg/(m^2). GEN:  Well nourished,  well developed, in no acute distress. HEENT: normal. Neck: Supple, no JVD, carotid bruits, or masses. Cardiac: RRR, tachycardic, 2/6 SEM RUSB, no rubs or gallops. No clubbing, cyanosis, edema.  Radials/DP/PT 2+ and equal bilaterally.  Respiratory:  Respirations regular and unlabored, clear to auscultation bilaterally. GI: Soft, nontender, nondistended, BS + x 4. MS: no deformity or atrophy. Skin: warm and dry, no rash. Neuro:  Strength and sensation are intact. Psych: Normal affect.  Accessory Clinical Findings    ECG - Aflutter, 120, left axis, LAFB, delayed R progression - no acute st/t changes.  Assessment & Plan    1.  Paroxysmal Atrial Flutter w/ h/o paroxysmal Afib:  Pt presents today in the setting of recurrent atrial flutter following successful initially successful TEE/DCCV on 5/25.  Unfortunately, he was likely back in flutter by the morning of 5/26, when he noted recurrent right sided pleuritic chest discomfort and congestion, associated with a globus sensation.  He previously had Afib in the setting of respiratory illness and post-op back surgery in 07/2015 with failed DCCV in late Feb followed by subsequent conversion on amio.  He was noted to have atrial flutter in setting of c/p in late march/early April and underwent DCCV in April, which appears to have lasted only a week, and repeat TEE/DCCV on 5/25.  He is currently on amio 200 daily and dilt 240 daily.  SBP is 100.  Recent TEE showed nl EF with severe bi-atrial enlargement.  I discussed his case with Dr. Rayann Heman today.  I will increase his amio back to 200 mg BID, cont dilt, and arrange for EP follow-up as soon as possible to consider and arrange for Aflutter ablation.  He remains on coumadin and INR was therapeutic on 5/25.  This is followed by primary care.  Pt and dtr may be interested in switching to [place].  They are concerned re: labile INR's.  Hospital note from 2/20 indicated that warfarin was chosen due to h/o  GIB in 2010 w/ AVM's (ablated), and ability to reverse warfarin.  H/H has been stable on coumadin - consider switching pending further EP eval.  2.  CAD:  S/P BMS to the LCX in 2010 @ [facility].  Low risk nuc study in 04/2015.  He has had some chest discomfort over the course of the past 5 months, always in the setting of atrial flutter.  He is not on ASA 2/2 coumadin anticoagulation. No  blocker 2/2 needing dilt and soft BP.  Resume previous dose of pravachol - stopped for unclear reasons following prolonged hospitalization in 07/2015 and 08/2015.  LFT's nl on 10/25/2015.  3.  Hypertensive Heart Dzs:  Stable on dilt.  4.  HL:  Resume pravachol as above.  5.  DM II:  Oral agent per primary care.  6.  Chronic Diastolic CHF:  euvolemic on exam.  Not currently on lasix in setting of CKD III with AKI in mid April.  He weighs himself @ home and this has been stable.  HR elevated in setting of #1, though BP stable.  We discussed the importance of daily weights, sodium restriction, medication compliance, and symptom reporting and he verbalizes understanding.   7.  CKD III:  Creat recently stable.  8.  PMR:  On oral steroids. Has rheum f/u.  9.  Dispo:  F/u with EP next week for consideration of aflutter ablation.  Murray Hodgkins, NP 11/10/2015, 1:35 PM

## 2015-11-10 NOTE — Patient Instructions (Signed)
Please check in with your insurance - I need a list of covered diabetes medicines with the copay you can afford so we can decide on what to add for diabetes _ I understand you don't want a shot if possible  Try to eat a diabetic diet  In the future - exercise and diabetic teaching will be helpful when it can be done  Labs today  Chest xray today - I think you have some upper airway congestion but I want to see what is going on  Use the albuterol inhaler as needed  See the cardiologist today  Follow up with rheumatology when cleared by cardiology - to discuss Remicaide and also prednisone

## 2015-11-10 NOTE — Assessment & Plan Note (Signed)
This has worsened with prolonged illness and stress along with steroids for recent resp illness/copd as well as pmr  Lab Results  Component Value Date   HGBA1C 9.2* 10/25/2015   On glipizide Metformin not an option due to CKD Will check with insurance re: what is affordible for next step - he greatly wants to avoid injections  No time for DM education now -maybe later

## 2015-11-10 NOTE — Assessment & Plan Note (Signed)
Has trace to 1 plus pedal edema today Pending cxr report Has fu cardiology today Not currently taking his lasix

## 2015-11-10 NOTE — Progress Notes (Signed)
Subjective:    Patient ID: Curtis Frazier, male    DOB: [birth year], 75 y.o.   MRN: [id number]  HPI Here for f/u of multiple medical problems  Seeing cardiology for CHF and a fib and CAD Recently cardioverted HR was high at home - ? If he was back in a fib  Cp- he thinks it is due to lung congestion   C/o congestion  Runny nose Ears are stopped up  Feels chest congestion (won't come loose ) - when he does - is brown sputum Sometimes a little wheezing from "throat" but not deep in his lungs Some pain to take a deep breath (on the right side)  This started on Friday after his cardioversion on Thursday  Did have some anesthesia for that   Has appt with cardiol at 1:30 today   Wt is up 6 lb since 10/21/15 Weight is stable at home   bmi is 26  Had bump in cr in hospital  Hx of CKD 3 Lab Results  Component Value Date   CREATININE 1.49* 11/04/2015   BUN 18 11/04/2015   NA 135 11/04/2015   K 4.3 11/04/2015   CL 106 11/04/2015   CO2 20* 11/04/2015  not on fluid restriction from cardiology  Drinks 3-4  Sixteen oz drinks per day   Is holding lasix currently - cardiology is aware  No ankle swelling today (had been in the past)     Glucose control worsened Has had steroids Lab Results  Component Value Date   HGBA1C 9.2* 10/25/2015  he is really thirsty and urinating a lot  Usually under 130 in am  Otherwise 200s-300s    (one time 400) Was on lantus in the hospital - was d/c when he came home  Family is trying to get his diet in order     PMR- "not good at all" Had appt with rheumatology on Tues and infusion was planned on Friday Told to hold off until after cardioversion  10 mg of prednisone per day   Thyroid Lab Results  Component Value Date   TSH 1.08 10/25/2015    Is on amiodarone   Patient Active Problem List   Diagnosis Date Noted  . Productive cough 11/10/2015  . Hypertensive heart disease   . CKD (chronic kidney disease), stage III   . Moderate aortic  stenosis   . Chronic diastolic CHF (congestive heart failure) ([place])   . CAD (coronary artery disease)   . Hyperlipidemia   . Persistent atrial fibrillation ([place])   . Fever 10/11/2015  . Dystrophic nail 10/08/2015  . Abnormal finding on thyroid function test 09/24/2015  . Acute on chronic diastolic heart failure ([place]) 09/24/2015  . Injury of kidney 09/24/2015  . At risk for infection 09/24/2015  . Type 2 diabetes mellitus ([place]) 09/24/2015  . Pain in the chest   . Acute on chronic diastolic HF (heart failure) ([place])   . Atrial fibrillation with RVR ([place])   . Atrial flutter ([place])   . Chest pain on exertion 09/09/2015  . Elevated troponin 09/09/2015  . Kidney disease, chronic, stage III (GFR 30-59 ml/min) 09/09/2015  . Anemia of chronic disease 09/09/2015  . Anemia in chronic illness 09/09/2015  . Chest pain 09/09/2015  . Chronic kidney disease (CKD), stage III (moderate) 09/09/2015  . Acute posthemorrhagic anemia   . Abnormal thyroid function test   . Arterial hypotension   . Paroxysmal atrial fibrillation (HCC)   . Diabetes mellitus with complication ([place])   .  Symptomatic cholelithiasis 09/07/2015  . Calculus of gallbladder 09/07/2015  . Spondylosis of lumbar region without myelopathy or radiculopathy   . Urinary retention 08/12/2015  . Bladder retention 08/12/2015  . Chronic diastolic congestive heart failure ([place]) 08/10/2015  . Chronic diastolic heart failure ([place]) 08/10/2015  . Back pain without radiation   . Counseling regarding advanced care planning and goals of care   . S/P lumbar laminectomy 07/17/2015  . Postprocedural state 07/17/2015  . Back pain, thoracic 01/15/2015  . Chest wall pain 01/15/2015  . Encounter for Medicare annual wellness exam 07/07/2013  . BPH (benign prostatic hyperplasia) 07/05/2012  . Enlarged prostate 07/05/2012  . Prostate cancer screening 06/27/2012  . Low TSH level 01/22/2012  . PMR (polymyalgia rheumatica) (HCC) 01/20/2012  . Polymyalgia  rheumatica ([place]) 01/20/2012  . HYPERKALEMIA 02/18/2010  . ANGIODYSPLASIA-INTESTINE 06/10/2009  . Angiodysplasia of colon 06/10/2009  . MURMUR 05/21/2009  . Left carotid bruit 05/21/2009  . Cardiac murmur 05/21/2009  . Coronary atherosclerosis 03/09/2009  . CAD in native artery 03/09/2009  . Rheumatoid arthritis ([place]) 04/29/2008  . HX, PERSONAL, COLONIC POLYPS 01/17/2007  . History of colon polyps 01/17/2007  . PSORIATIC ARTHRITIS 09/18/2006  . PSORIASIS 09/18/2006  . 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 DEGENERATIVE DISC DISEASE 09/18/2006  . SCOLIOSIS 09/18/2006  . TOBACCO ABUSE, HX OF 09/18/2006  . 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 Narrowing of intervertebral disc space 09/18/2006  . Idiopathic scoliosis 09/18/2006  . Nicotine addiction 09/18/2006   Past Medical History  Diagnosis Date  . CAD (coronary artery disease)     a. 2010 s/p MI/Cath/PCI ([facility]): LM 10, LAD 30p, 49m, D1 20, D2 30, LCX 30p, 37m (3.5 x 15 Driver BMS), [id number], RCA 70p, 8m; b. 04/2011 Neg MV;  c. 04/2015 Myoview: very mild apical thinning, no ischemia, EF 65%.  . Hyperlipidemia     a. takes pravachol.  . Personal history of colonic polyps   . Actinic keratosis   . Psoriatic arthropathy ([place])   . Other psoriasis   . Scoliosis (and kyphoscoliosis), idiopathic   . GI bleed     a. 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  . Gastritis 12/10  . Hypertensive heart disease     a. takes Metoprolol daily  . Dyspnea on exertion   . Headache(784.0)     occasionally  . Joint pain   . Chronic back pain     scoliosis/stenosis/radiculopathy,degenerative disc disease  . Psoriasis   . Bruises easily   . Esophageal reflux     takes Omeprazole daily  . History of colonic polyps   . Hemorrhoids   . History of nephrolithiasis   . History of blood transfusion 2010  . PMR (polymyalgia rheumatica) (HCC) 01/20/2012    tx by specialist on a long prednisone taper  . Rheumatoid arthritis(714.0)     takes Methotrexate  7pills weekly  . Degeneration of intervertebral disc, site unspecified   . History of pneumonia 2008; 07/2015  . Type II or unspecified type diabetes mellitus without mention of complication, not stated as uncontrolled   . Skin cancer     "arms"  . Chronic diastolic CHF (congestive heart failure) ([place])     a. 08/2015 Echo: EF 55-60%, mild LVH, no rwma, Gr1 DD, mod AS, mildly dil LA, PASP 35mmHg.  . Moderate aortic stenosis     a. 08/2015 Mod AS, valve area (VTI) - 1.5cm^2, (Vmax) - 1.25cm^2; b. 10/2015 TEE EF 55-60%, mod AS.  Marland Kitchen Paroxysmal atrial fibrillation (HCC)  a. 06/2011 Post-op AF-->converted on amio;  b. 07/2015 TEE/unsuccessful DCCV x 2; c. 09/2015 recurrent AF/Flutter-->successful DCCV; d. 09/2015 Recurrent AF-->10/2015 s/p TEE (EF 55-60%, mod AS, mild MR, sev dil LA w/o LAA thrombus, sev dil RA, mild to mod TR) & DCCV (120 J x 1).  . Lumbar stenosis     a. s/p lumbar diskectomy 2017.  . Carotid arterial disease ([place])     a. 10/2015 Carotid US: bilat 1-39% heterogeneous plaque/stenosis. Nl subclavian arteries bilat.  . CKD (chronic kidney disease), stage III     a. 09/2015 AKI in setting of dehydration - seen @ [facility].   Past Surgical History  Procedure Laterality Date  . Rotator cuff repair Left 2000  . Colonoscopy    . Vasectomy  1979  . Coronary angioplasty with stent placement  02/2009    has one stent  . Carotid doppler  10/12    0-39% R and 60-79% left   . Cataract extraction  2012  . Cardiac catheterization  2010  . Lithotripsy  2009  . Tee without cardioversion N/A 08/06/2015    Procedure: TRANSESOPHAGEAL ECHOCARDIOGRAM (TEE);  Surgeon: Skeet Latch, MD;  Location: [place];  Service: Cardiovascular;  Laterality: N/A;  . Cardioversion N/A 08/06/2015    Procedure: CARDIOVERSION;  Surgeon: Skeet Latch, MD;  Location[facility]COPY;  Service: Cardiovascular;  Laterality: N/A;  . Back surgery    . Lumbar laminectomy  07/2015  . Posterior laminectomy thoracic spine        "fixed the discs"  . Skin cancer excision      "arms"  . Tee without cardioversion N/A 09/14/2015    Procedure: TRANSESOPHAGEAL ECHOCARDIOGRAM (TEE);  Surgeon: Jerline Pain, MD;  Location: [place];  Service: Cardiovascular;  Laterality: N/A;  . Cardioversion N/A 09/14/2015    Procedure: CARDIOVERSION;  Surgeon: Jerline Pain, MD;  Location: [place];  Service: Cardiovascular;  Laterality: N/A;  . Tee without cardioversion N/A 11/04/2015    Procedure: TRANSESOPHAGEAL ECHOCARDIOGRAM (TEE);  Surgeon: Sanda Klein, MD;  Location: [place];  Service: Cardiovascular;  Laterality: N/A;  . Cardioversion N/A 11/04/2015    Procedure: CARDIOVERSION;  Surgeon: Sanda Klein, MD;  Location: [place] [facility]OSCOPY;  Service: Cardiovascular;  Laterality: N/A;   Social History  Substance Use Topics  . Smoking status: Former Smoker -- 1.50 packs/day for 53 years    Types: Cigarettes  . Smokeless tobacco: Never Used     Comment: quit smoking cigarettes in 2010  . Alcohol Use: 0.0 oz/week    0 Standard drinks or equivalent per week     Comment: 08/11/2015 "might have 1-2 margaritas/month in the summertime"   Family History  Problem Relation Age of Onset  . Coronary artery disease Mother   . Diabetes Mother   . CAD Mother   . Coronary artery disease Sister   . Hypertension Sister   . Kidney failure Sister   . CAD Sister   . Diabetes Brother   . Prostate cancer Brother   . Pancreatic cancer Brother   . Diabetes Brother   . Anesthesia problems Neg Hx   . Hypotension Neg Hx   . Malignant hyperthermia Neg Hx   . Pseudochol deficiency Neg Hx    Allergies  Allergen Reactions  . Ace Inhibitors Other (See Comments)    Causes high K Causes high K  . Angiotensin Receptor Blockers Other (See Comments)    Causes high K Causes high K  . Metoprolol Other (See Comments)  May cause elevated K level May cause elevated K level  . Penicillins Swelling and Rash    Has patient had a PCN reaction  causing immediate rash, facial/tongue/throat swelling, SOB or lightheadedness with hypotension: YES Has patient had a PCN reaction causing severe rash involving mucus membranes or skin necrosis: NO Has patient had a PCN reaction that required hospitalization NO Has patient had a PCN reaction occurring within the last 10 years: NO If all of the above answers are "NO", then may proceed with Cephalosporin use. Has patient had a PCN reaction causing immediate rash, facial/tongue/throat swelling, SOB or lightheadedness with hypotension: YES Has patient had a PCN reaction causing severe rash involving mucus membranes or skin necrosis: NO Has patient had a PCN reaction that required hospitalization NO Has patient had a PCN reaction occurring within the last 10 years: NO If all of the above answers are "NO", then may proceed with Cephalosporin use.  . Tetracycline Swelling and Rash   Current Outpatient Prescriptions on File Prior to Visit  Medication Sig Dispense Refill  . acetaminophen (TYLENOL) 325 MG tablet Take 650 mg by mouth every 6 (six) hours as needed (Pain scale of 1-4 or temp > 101).    Marland Kitchen amiodarone (PACERONE) 200 MG tablet Take 200mg  by mouth twice daily until 5/4 then decrease to 200mg  (1 tablet) daily (Patient taking differently: Take 200 mg by mouth daily. Take 200mg  by mouth twice daily until 5/4 then decrease to 200mg  (1 tablet) daily) 30 tablet 6  . calcium-vitamin D (OSCAL WITH D) 500-200 MG-UNIT tablet Take 1 tablet by mouth daily with breakfast.    . diltiazem (CARDIZEM CD) 240 MG 24 hr capsule TAKE 1 CAPSULE BY MOUTH DAILY 30 capsule 5  . docusate sodium (COLACE) 100 MG capsule Take 100 mg by mouth 2 (two) times daily. Reported on 09/22/2015    . ferrous sulfate 325 (65 FE) MG tablet Take 1 tablet (325 mg total) by mouth 3 (three) times daily with meals. 90 tablet 3  . glipiZIDE (GLUCOTROL) 10 MG tablet Take 1 tablet (10 mg total) by mouth 2 (two) times daily before a meal. 180  tablet 1  . glucose blood (ONE TOUCH ULTRA TEST) test strip Check blood sugar once daily and as directed. Dx E11.8 100 each 3  . pantoprazole (PROTONIX) 40 MG tablet Take 1 tablet (40 mg total) by mouth daily. 30 tablet 5  . predniSONE (DELTASONE) 10 MG tablet Take 1 tablet (10 mg total) by mouth daily with breakfast. 30 tablet 0  . thiamine 100 MG tablet Take 1 tablet (100 mg total) by mouth daily. 30 tablet 5  . warfarin (COUMADIN) 2.5 MG tablet Take as directed by anti-coagulation clinic. 90 tablet 0   No current facility-administered medications on file prior to visit.      Review of Systems Review of Systems  Constitutional: Negative for fever, appetite change, and unexpected weight change.  Eyes: Negative for pain and visual disturbance.  ENt pos for nasal congestion and post nasal drip  Respiratory: Negative for shortness of breath.  pos for cough and some wheezing Cardiovascular: Negative for cp or palpitations  (pos for chest wall pain when he takes a deep breath)  pos for rapid HR at times  Gastrointestinal: Negative for nausea, diarrhea and constipation.  Genitourinary: Negative for urgency and frequency.  Skin: Negative for pallor or rash   MSK pos for joint and myofacial pain worse in the hips  Neurological: Negative for weakness, light-headedness, numbness  and headaches.  Hematological: Negative for adenopathy. Does not bruise/bleed easily.  Psychiatric/Behavioral: Negative for dysphoric mood. The patient is not nervous/anxious.  he admits to being frustrated by his health lately        Objective:   Physical Exam  Constitutional: He appears well-developed and well-nourished. No distress.  Well appearing with junky cough  HENT:  Head: Normocephalic and atraumatic.  Right Ear: External ear normal.  Left Ear: External ear normal.  Mouth/Throat: Oropharynx is clear and moist.  Nares are injected and congested  No sinus tenderness Clear rhinorrhea and post nasal drip     Eyes: Conjunctivae and EOM are normal. Pupils are equal, round, and reactive to light. Right eye exhibits no discharge. Left eye exhibits no discharge.  Neck: Normal range of motion. Neck supple. No JVD present. Carotid bruit is not present. No thyromegaly present.  Cardiovascular: Normal rate, regular rhythm and intact distal pulses.  Exam reveals no gallop.   Murmur heard. Pulmonary/Chest: Effort normal. No respiratory distress. He has wheezes. He has no rales. He exhibits no tenderness.  No crackles Harsh and distant bs occ end exp wheeze Upper airway sounds noted cleared by cough No rales   Abdominal: Soft. Bowel sounds are normal. He exhibits no distension, no abdominal bruit and no mass. There is no tenderness.  Musculoskeletal: He exhibits no edema.  Lymphadenopathy:    He has no cervical adenopathy.  Neurological: He is alert. He has normal reflexes. No cranial nerve deficit. He exhibits normal muscle tone. Coordination normal.  Skin: Skin is warm and dry. No rash noted. No pallor.  Sallow complexion  Psychiatric: He has a normal mood and affect.          Assessment & Plan:   Problem List Items Addressed This Visit      Cardiovascular and Mediastinum   Paroxysmal atrial fibrillation (HCC)    Tachycardia s/p cardioversion - has appt with cardiology this afternoon      Chronic diastolic congestive heart failure ([place])    Has trace to 1 plus pedal edema today Pending cxr report Has fu cardiology today Not currently taking his lasix        Endocrine   Diabetes mellitus with complication ([place]) - Primary    This has worsened with prolonged illness and stress along with steroids for recent resp illness/copd as well as pmr  Lab Results  Component Value Date   HGBA1C 9.2* 10/25/2015   On glipizide Metformin not an option due to CKD Will check with insurance re: what is affordible for next step - he greatly wants to avoid injections  No time for DM education now  -maybe later       Relevant Orders   Comprehensive metabolic panel     Musculoskeletal and Integument   Rheumatoid arthritis ([place])    Will f/u with rheumatology when he is cleared by cardiology On 10 mg prednisone-hopes to wean  Also due for remicade infusion  A lot of pain now esp in hips         Genitourinary   Chronic kidney disease (CKD), stage III (moderate)    Lab today  Fluid intake fair No lasix currently       Relevant Orders   Comprehensive metabolic panel     Other   Productive cough    Some cough with scant phlegm and upper airway sounds on exam  Also c/o chest soreness (does not think it is his heart) Suspect viral uri  cxr today  in light of hx of smoking and pneumonia  Also rev last CT today        Relevant Orders   DG Chest 2 View   CBC with Differential/Platelet    Other Visit Diagnoses    Cough        Relevant Medications    albuterol (PROVENTIL HFA;VENTOLIN HFA) 108 (90 Base) MCG/ACT inhaler

## 2015-11-10 NOTE — Assessment & Plan Note (Signed)
Lab today  Fluid intake fair No lasix currently

## 2015-11-10 NOTE — Assessment & Plan Note (Signed)
Tachycardia s/p cardioversion - has appt with cardiology this afternoon

## 2015-11-11 ENCOUNTER — Ambulatory Visit (INDEPENDENT_AMBULATORY_CARE_PROVIDER_SITE_OTHER): Payer: Medicare Other | Admitting: *Deleted

## 2015-11-11 ENCOUNTER — Telehealth: Payer: Self-pay | Admitting: Family Medicine

## 2015-11-11 DIAGNOSIS — I4891 Unspecified atrial fibrillation: Secondary | ICD-10-CM

## 2015-11-11 LAB — POCT INR: INR: 2.1

## 2015-11-11 NOTE — Telephone Encounter (Signed)
Claiborne Billings from [facility] called regarding Curtis Frazier. PT 24.6. INR 2.1, HR 114, taken this morning. Needs call back to discuss dosage. Current 5mg  M, W, F & 3.5 mg T, Th, Sa [phone]

## 2015-11-11 NOTE — Telephone Encounter (Signed)
See anticoagulation encounter

## 2015-11-15 ENCOUNTER — Other Ambulatory Visit: Payer: Self-pay | Admitting: *Deleted

## 2015-11-15 MED ORDER — GLUCOSE BLOOD VI STRP: ORAL_STRIP | Status: AC

## 2015-11-15 NOTE — Telephone Encounter (Signed)
Patient called stating that he needs a new script sent to the pharmacy for his one touch test strips. Advised patient that he should have refills at the pharmacy. Patient stated that he is running out and needs a 90 day supply sent to the pharmacy. Patient stated that he is testing his blood sugar now 3 times a day and not once daily since his hospital stay. Patient stated that he will probably drop back to twice a day soon. Advised patient note will go to Dr. Glori Bickers to verify how often he needs to be checking his blood sugar and script will be sent with the the correct directions.

## 2015-11-15 NOTE — Telephone Encounter (Signed)
I sent it electronically with tid directions since DM is poorly controlled

## 2015-11-18 ENCOUNTER — Telehealth: Payer: Self-pay

## 2015-11-18 NOTE — Telephone Encounter (Signed)
Curtis Frazier (DPR signed) left v/m requesting refill test strips; refill has been done but [facility] [employer] said ins will not approve until 01/13/16. Spoke with pt and Curtis Frazier and advised what pharmacist said. Pt will ck with pharmacy.

## 2015-11-18 NOTE — Progress Notes (Signed)
Electrophysiology Office Note   Date:  11/19/2015   ID:  Curtis Frazier, DOB 04/03/1941, MRN [id number]  PCP:  Loura Pardon, MD  Cardiologist:  Johnsie Cancel Primary Electrophysiologist:  Equilla Que Meredith Leeds, MD    Chief Complaint  Patient presents with  . Advice Only     History of Present Illness: Curtis Frazier is a 75 y.o. male who presents today for electrophysiology evaluation.   He has a history of CAD s/p CABG, HLD, HTN, PMR, RA, DM2, diastolic HF, paroxysmal AF.  He had an unsuccessful DCCV on 08/06/15 but converted to sinus rhythm with amiodarone.  He has had multiple admissions for AF/flutter.  He had a DCCV on 09/14/15 after amiodarone was increased.  He had a third DCCV which was successful on 11/04/15.  He went back into atrial flutter on 11/06/15 with HR 110-120s.   Today, he denies symptoms of palpitations, chest pain, shortness of breath, orthopnea, PND, lower extremity edema, claudication, dizziness, presyncope, syncope, bleeding, or neurologic sequela. The patient is tolerating medications without difficulties and is otherwise without complaint today. Yesterday, he got an infusion of Remicade. He says that he is felt much better since that time. The infusion was for his polymyalgia rheumatica. He says that he has not been having palpitations for the last couple weeks. He was having chest pain in the center of his chest that rate radiated to his back that worsened with deep breath. Since his infusion of Remicade, this has improved as well. He says that today he is feeling well without any major complaints.   Past Medical History  Diagnosis Date  . CAD (coronary artery disease)     a. 2010 s/p MI/Cath/PCI ([facility]): LM 10, LAD 30p, 68m, D1 20, D2 30, LCX 30p, 9m (3.5 x 15 Driver BMS), [id number], RCA 70p, 36m; b. 04/2011 Neg MV;  c. 04/2015 Myoview: very mild apical thinning, no ischemia, EF 65%.  . Hyperlipidemia     a. takes pravachol.  . Personal history of colonic polyps   . Actinic  keratosis   . Psoriatic arthropathy ([place])   . Other psoriasis   . Scoliosis (and kyphoscoliosis), idiopathic   . GI bleed     a. 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  . Gastritis 12/10  . Hypertensive heart disease     a. takes Metoprolol daily  . Dyspnea on exertion   . Headache(784.0)     occasionally  . Joint pain   . Chronic back pain     scoliosis/stenosis/radiculopathy,degenerative disc disease  . Psoriasis   . Bruises easily   . Esophageal reflux     takes Omeprazole daily  . History of colonic polyps   . Hemorrhoids   . History of nephrolithiasis   . History of blood transfusion 2010  . PMR (polymyalgia rheumatica) ([facility]) 01/20/2012    tx by specialist on a long prednisone taper  . Rheumatoid arthritis(714.0)     takes Methotrexate 7pills weekly  . Degeneration of intervertebral disc, site unspecified   . History of pneumonia 2008; 07/2015  . Type II or unspecified type diabetes mellitus without mention of complication, not stated as uncontrolled   . Skin cancer     "arms"  . Chronic diastolic CHF (congestive heart failure) ([place])     a. 08/2015 Echo: EF 55-60%, mild LVH, no rwma, Gr1 DD, mod AS, mildly dil LA, PASP 93mmHg.  . Moderate aortic stenosis     a. 08/2015 Mod AS, valve area (VTI) -  1.5cm^2, (Vmax) - 1.25cm^2; b. 10/2015 TEE EF 55-60%, mod AS.  Marland Kitchen Paroxysmal atrial fibrillation ([place])     a. 06/2011 Post-op AF-->converted on amio;  b. 07/2015 TEE/unsuccessful DCCV x 2; c. 09/2015 recurrent AF/Flutter-->successful DCCV; d. 09/2015 Recurrent AF-->10/2015 s/p TEE (EF 55-60%, mod AS, mild MR, sev dil LA w/o LAA thrombus, sev dil RA, mild to mod TR) & DCCV (120 J x 1).  . Lumbar stenosis     a. s/p lumbar diskectomy 2017.  . Carotid arterial disease ([place])     a. 10/2015 Carotid US: bilat 1-39% heterogeneous plaque/stenosis. Nl subclavian arteries bilat.  . CKD (chronic kidney disease), stage III     a. 09/2015 AKI in setting of dehydration - seen @ [facility].   Past Surgical  History  Procedure Laterality Date  . Rotator cuff repair Left 2000  . Colonoscopy    . Vasectomy  1979  . Coronary angioplasty with stent placement  02/2009    has one stent  . Carotid doppler  10/12    0-39% R and 60-79% left   . Cataract extraction  2012  . Cardiac catheterization  2010  . Lithotripsy  2009  . Tee without cardioversion N/A 08/06/2015    Procedure: TRANSESOPHAGEAL ECHOCARDIOGRAM (TEE);  Surgeon: Skeet Latch, MD;  Location: [facility];  Service: Cardiovascular;  Laterality: N/A;  . Cardioversion N/A 08/06/2015    Procedure: CARDIOVERSION;  Surgeon: Skeet Latch, MD;  Location: [facility] ENDOSCOPY;  Service: Cardiovascular;  Laterality: N/A;  . Back surgery    . Lumbar laminectomy  07/2015  . Posterior laminectomy thoracic spine      "fixed the discs"  . Skin cancer excision      "arms"  . Tee without cardioversion N/A 09/14/2015    Procedure: TRANSESOPHAGEAL ECHOCARDIOGRAM (TEE);  Surgeon: Jerline Pain, MD;  Location: [facility];  Service: Cardiovascular;  Laterality: N/A;  . Cardioversion N/A 09/14/2015    Procedure: CARDIOVERSION;  Surgeon: Jerline Pain, MD;  Location: [facility];  Service: Cardiovascular;  Laterality: N/A;  . Tee without cardioversion N/A 11/04/2015    Procedure: TRANSESOPHAGEAL ECHOCARDIOGRAM (TEE);  Surgeon: Sanda Klein, MD;  Location: [place];  Service: Cardiovascular;  Laterality: N/A;  . Cardioversion N/A 11/04/2015    Procedure: CARDIOVERSION;  Surgeon: Sanda Klein, MD;  Location: [facility] ENDOSCOPY;  Service: Cardiovascular;  Laterality: N/A;     Current Outpatient Prescriptions  Medication Sig Dispense Refill  . acetaminophen (TYLENOL) 325 MG tablet Take 650 mg by mouth every 6 (six) hours as needed (Pain scale of 1-4 or temp > 101).    Marland Kitchen albuterol (PROVENTIL HFA;VENTOLIN HFA) 108 (90 Base) MCG/ACT inhaler Inhale 2 puffs into the lungs every 6 (six) hours as needed for wheezing or shortness of breath. 1 Inhaler 5  . amiodarone  (PACERONE) 200 MG tablet Take 1 tablet (200 mg total) by mouth 2 (two) times daily. 180 tablet 3  . calcium-vitamin D (OSCAL WITH D) 500-200 MG-UNIT tablet Take 1 tablet by mouth daily with breakfast.    . diltiazem (CARDIZEM CD) 240 MG 24 hr capsule TAKE 1 CAPSULE BY MOUTH DAILY 30 capsule 5  . docusate sodium (COLACE) 100 MG capsule Take 100 mg by mouth 2 (two) times daily. Reported on 09/22/2015    . ferrous sulfate 325 (65 FE) MG tablet Take 1 tablet (325 mg total) by mouth 3 (three) times daily with meals. 90 tablet 3  . glipiZIDE (GLUCOTROL) 10 MG tablet Take 1 tablet (10 mg total) by mouth  2 (two) times daily before a meal. 180 tablet 1  . glucose blood (ONE TOUCH ULTRA TEST) test strip Check blood sugar three times daily and as directed. Dx E11.8 diabetes that is poorly controlled 270 each 3  . pantoprazole (PROTONIX) 40 MG tablet Take 1 tablet (40 mg total) by mouth daily. 30 tablet 5  . pravastatin (PRAVACHOL) 20 MG tablet Take 1 tablet (20 mg total) by mouth every evening. 90 tablet 3  . predniSONE (DELTASONE) 10 MG tablet Take 1 tablet (10 mg total) by mouth daily with breakfast. 30 tablet 0  . thiamine 100 MG tablet Take 1 tablet (100 mg total) by mouth daily. 30 tablet 5  . warfarin (COUMADIN) 2.5 MG tablet Take as directed by anti-coagulation clinic. 90 tablet 0   No current facility-administered medications for this visit.    Allergies:   Ace inhibitors; Angiotensin receptor blockers; Metoprolol; Penicillins; and Tetracycline   Social History:  The patient  reports that he has quit smoking. His smoking use included Cigarettes. He has a 79.5 pack-year smoking history. He has never used smokeless tobacco. He reports that he drinks alcohol. He reports that he does not use illicit drugs.   Family History:  The patient's family history includes CAD in his mother and sister; Coronary artery disease in his mother and sister; Diabetes in his brother, brother, and mother; Hypertension in  his sister; Kidney failure in his sister; Pancreatic cancer in his brother; Prostate cancer in his brother. There is no history of Anesthesia problems, Hypotension, Malignant hyperthermia, or Pseudochol deficiency.    ROS:  Please see the history of present illness.   Otherwise, review of systems is positive for chset pain, cough, DOE, joint swelling, easy bruising.   All other systems are reviewed and negative.    PHYSICAL EXAM: VS:  BP 134/70 mmHg  Pulse 74  Ht 5\' 8"  (1.727 m)  Wt 168 lb 3.2 oz (76.295 kg)  BMI 25.58 kg/m2  SpO2 97% , BMI Body mass index is 25.58 kg/(m^2). GEN: Well nourished, well developed, in no acute distress HEENT: normal Neck: no JVD, carotid bruits, or masses Cardiac: RRR; no murmurs, rubs, or gallops,no edema  Respiratory:  clear to auscultation bilaterally, normal work of breathing GI: soft, nontender, nondistended, + BS MS: no deformity or atrophy Skin: warm and dry Neuro:  Strength and sensation are intact Psych: euthymic mood, full affect  EKG:  EKG is not ordered today.  Recent Labs: 09/09/2015: Magnesium 2.0 09/12/2015: B Natriuretic Peptide 1866.7* 10/25/2015: TSH 1.08 11/10/2015: ALT 14; BUN 27*; Creatinine, Ser 1.37; Hemoglobin 11.6*; Platelets 258.0; Potassium 4.2; Sodium 135    Lipid Panel     Component Value Date/Time   CHOL 155 09/06/2015 0343   TRIG 61 09/06/2015 0343   HDL 43 09/06/2015 0343   CHOLHDL 3.6 09/06/2015 0343   VLDL 12 09/06/2015 0343   LDLCALC 100* 09/06/2015 0343   LDLDIRECT 131.3 06/28/2012 0831     Wt Readings from Last 3 Encounters:  11/19/15 168 lb 3.2 oz (76.295 kg)  11/10/15 168 lb (76.204 kg)  11/10/15 166 lb 8 oz (75.524 kg)      Other studies Reviewed: Additional studies/ records that were reviewed today include:  TEE 11/04/15 - Left ventricle: Systolic function was normal. The estimated  ejection fraction was in the range of 55% to 60%. Wall motion was  normal; there were no regional wall motion  abnormalities. - Aortic valve: There was moderate stenosis. - Mitral valve: Mildly calcified  annulus. No evidence of  vegetation. There was mild regurgitation. - Left atrium: The atrium was severely dilated. No evidence of  thrombus in the atrial cavity or appendage. No spontaneous echo  contrast was observed. - Right atrium: The atrium was severely dilated. - Atrial septum: No defect or patent foramen ovale was identified. - Tricuspid valve: No evidence of vegetation. There was  mild-moderate regurgitation directed centrally. - Pulmonic valve: No evidence of vegetation.   TTE 09/06/15 - Left ventricle: The cavity size was normal. Wall thickness was  increased in a pattern of mild LVH. Systolic function was normal.  The estimated ejection fraction was in the range of 55% to 60%.  Wall motion was normal; there were no regional wall motion  abnormalities. Doppler parameters are consistent with abnormal  left ventricular relaxation (grade 1 diastolic dysfunction).  Doppler parameters are consistent with elevated ventricular  end-diastolic filling pressure. - Aortic valve: There was moderate stenosis. Valve area (VTI): 1.5  cm^2. Valve area (Vmax): 1.25 cm^2. Valve area (Vmean): 1.14  cm^2. - Left atrium: The atrium was mildly dilated. - Atrial septum: No defect or patent foramen ovale was identified. - Pulmonary arteries: PA peak pressure: 31 mm Hg (S).  ASESSMENT AND PLAN:  1. Atrial fibrillation/flutter: Currently on Coumadin for anticoagulation. He has been in and out of the hospital with mainly atrial flutter for the last few months. He does have atrial fibrillation, but it appears that the atrial fibrillation occurred around the time of surgery. Due to the multiple recurrences of atrial flutter requiring cardioversion, we'll plan for ablation. I discussed with him the risks and benefits of ablation. Risks include bleeding, tamponade, heart block, and stroke, among others.  He understands these risks and has agreed to the procedure. I did discuss with him that if he has further atrial fibrillation, it may be worth ablating the atrial fibrillation as well.    Current medicines are reviewed at length with the patient today.   The patient does not have concerns regarding his medicines.  The following changes were made today:  none  Labs/ tests ordered today include:  No orders of the defined types were placed in this encounter.     Disposition:   FU with Bettyanne Dittman 4 months  Signed, Nell Gales Meredith Leeds, MD  11/19/2015 4:19 PM     [address] [place] [address] [phone] (office) [phone] (fax)

## 2015-11-19 ENCOUNTER — Encounter: Payer: Self-pay | Admitting: Cardiology

## 2015-11-19 ENCOUNTER — Ambulatory Visit (INDEPENDENT_AMBULATORY_CARE_PROVIDER_SITE_OTHER): Payer: Medicare Other | Admitting: Cardiology

## 2015-11-19 ENCOUNTER — Encounter: Payer: Self-pay | Admitting: *Deleted

## 2015-11-19 VITALS — BP 134/70 | HR 74 | Ht 68.0 in | Wt 168.2 lb

## 2015-11-19 DIAGNOSIS — I483 Typical atrial flutter: Secondary | ICD-10-CM | POA: Diagnosis not present

## 2015-11-19 NOTE — Patient Instructions (Signed)
Medication Instructions:  Your physician recommends that you continue on your current medications as directed. Please refer to the Current Medication list given to you today.  Labwork: None ordered  Testing/Procedures: Your physician has recommended that you have an Atrial Flutter ablation. Catheter ablation is a medical procedure used to treat some cardiac arrhythmias (irregular heartbeats). During catheter ablation, a long, thin, flexible tube is put into a blood vessel in your groin (upper thigh), or neck. This tube is called an ablation catheter. It is then guided to your heart through the blood vessel. Radio frequency waves destroy small areas of heart tissue where abnormal heartbeats may cause an arrhythmia to start. Please see the instruction sheet given to you today.  Follow-Up: Your physician recommends that you schedule a follow-up appointment between July 17-28th with Dr. Curt Bears.  Your physician recommends that you schedule a follow-up appointment in: 4 weeks, after your procedure on 01/10/16, with Dr. Curt Bears.  If you need a refill on your cardiac medications before your next appointment, please call your pharmacy.  Thank you for choosing [facility]!!   Curtis Curet, RN [phone]   Any Other Special Instructions Will Be Listed Below (If Applicable). Cardiac Ablation Cardiac ablation is a procedure to disable a small amount of heart tissue in very specific places. The heart has many electrical connections. Sometimes these connections are abnormal and can cause the heart to beat very fast or irregularly. By disabling some of the problem areas, heart rhythm can be improved or made normal. Ablation is done for people who:   Have Wolff-Parkinson-White syndrome.   Have other fast heart rhythms (tachycardia).   Have taken medicines for an abnormal heart rhythm (arrhythmia) that resulted in:   No success.   Side effects.   May have a high-risk heartbeat that  could result in death.  LET [facility] CARE PROVIDER KNOW ABOUT:   Any allergies you have or any previous reactions you have had to X-ray dye, food (such as seafood), medicine, or tape.   All medicines you are taking, including vitamins, herbs, eye drops, creams, and over-the-counter medicines.   Previous problems you or members of your family have had with the use of anesthetics.   Any blood disorders you have.   Previous surgeries or procedures (such as a kidney transplant) you have had.   Medical conditions you have (such as kidney failure).  RISKS AND COMPLICATIONS Generally, cardiac ablation is a safe procedure. However, problems can occur and include:   Increased risk of cancer. Depending on how long it takes to do the ablation, the dose of radiation can be high.  Bruising and bleeding where a thin, flexible tube (catheter) was inserted during the procedure.   Bleeding into the chest, especially into the sac that surrounds the heart (serious).  Need for a permanent pacemaker if the normal electrical system is damaged.   The procedure may not be fully effective, and this may not be recognized for months. Repeat ablation procedures are sometimes required. BEFORE THE PROCEDURE   Follow any instructions from your health care provider regarding eating and drinking before the procedure.   Take your medicines as directed at regular times with water, unless instructed otherwise by your health care provider. If you are taking diabetes medicine, including insulin, ask how you are to take it and if there are any special instructions you should follow. It is common to adjust insulin dosing the day of the ablation.  PROCEDURE  An ablation is usually  performed in a catheterization laboratory with the guidance of fluoroscopy. Fluoroscopy is a type of X-ray that helps your health care provider see images of your heart during the procedure.   An ablation is a minimally invasive  procedure. This means a small cut (incision) is made in either your neck or groin. Your health care provider will decide where to make the incision based on your medical history and physical exam.  An IV tube will be started before the procedure begins. You will be given an anesthetic or medicine to help you relax (sedative).  The skin on your neck or groin will be numbed. A needle will be inserted into a large vein in your neck or groin and catheters will be threaded to your heart.  A special dye that shows up on fluoroscopy pictures may be injected through the catheter. The dye helps your health care provider see the area of the heart that needs treatment.  The catheter has electrodes on the tip. When the area of heart tissue that is causing the arrhythmia is found, the catheter tip will send an electrical current to the area and "scar" the tissue. Three types of energy can be used to ablate the heart tissue:   Heat (radiofrequency energy).   Laser energy.   Extreme cold (cryoablation).   When the area of the heart has been ablated, the catheter will be taken out. Pressure will be held on the insertion site. This will help the insertion site clot and keep it from bleeding. A bandage will be placed on the insertion site.  AFTER THE PROCEDURE   After the procedure, you will be taken to a recovery area where your vital signs (blood pressure, heart rate, and breathing) will be monitored. The insertion site will also be monitored for bleeding.   You will need to lie still for 4-6 hours. This is to ensure you do not bleed from the catheter insertion site.    This information is not intended to replace advice given to you by your health care provider. Make sure you discuss any questions you have with your health care provider.   Document Released: 10/15/2008 Document Revised: 06/19/2014 Document Reviewed: 10/21/2012 Elsevier Interactive Patient Education [employer].

## 2015-11-19 NOTE — Telephone Encounter (Signed)
Pt left note that he has checked with [facility] and was told cannot get more test strips until 01/13/2016. I spoke with Nevin Bloodgood pharmacist at [facility]; [facility] received new rx with tid testing instructions but ins will not approve when try to run rx thru. Nevin Bloodgood will call ins co to find out what to do for pt to get test strips and Nevin Bloodgood will contact pt with info. Pt notified and voiced understanding.

## 2015-11-21 NOTE — Progress Notes (Signed)
Patient ID: Curtis Frazier, male   DOB: January 03, 1941, 75 y.o.   MRN: [id number]    Office Visit    Patient Name: Curtis Frazier Date of Encounter: 11/22/2015  Primary Care Provider:  Loura Pardon, MD Primary Cardiologist:  P. Johnsie Cancel, MD   Chief Complaint    75 y/o ? with a h/o CAD, HTN, HL, DM, polymyalgia rheumatica, and PAF s/p multiple DCCV's this year, who presents for f/u related to palpitations.  Past Medical History    Past Medical History  Diagnosis Date  . CAD (coronary artery disease)     a. 2010 s/p MI/Cath/PCI ([facility]): LM 10, LAD 30p, 73m, D1 20, D2 30, LCX 30p, 85m (3.5 x 15 Driver BMS), [id number], RCA 70p, 53m; b. 04/2011 Neg MV;  c. 04/2015 Myoview: very mild apical thinning, no ischemia, EF 65%.  . Hyperlipidemia     a. takes pravachol.  . Personal history of colonic polyps   . Actinic keratosis   . Psoriatic arthropathy ([place])   . Other psoriasis   . Scoliosis (and kyphoscoliosis), idiopathic   . GI bleed     a. 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  . Gastritis 12/10  . Hypertensive heart disease     a. takes Metoprolol daily  . Dyspnea on exertion   . Headache(784.0)     occasionally  . Joint pain   . Chronic back pain     scoliosis/stenosis/radiculopathy,degenerative disc disease  . Psoriasis   . Bruises easily   . Esophageal reflux     takes Omeprazole daily  . History of colonic polyps   . Hemorrhoids   . History of nephrolithiasis   . History of blood transfusion 2010  . PMR (polymyalgia rheumatica) ([facility]) 01/20/2012    tx by specialist on a long prednisone taper  . Rheumatoid arthritis(714.0)     takes Methotrexate 7pills weekly  . Degeneration of intervertebral disc, site unspecified   . History of pneumonia 2008; 07/2015  . Type II or unspecified type diabetes mellitus without mention of complication, not stated as uncontrolled   . Skin cancer     "arms"  . Chronic diastolic CHF (congestive heart failure) ([place])     a. 08/2015 Echo: EF 55-60%, mild LVH, no  rwma, Gr1 DD, mod AS, mildly dil LA, PASP 12mmHg.  . Moderate aortic stenosis     a. 08/2015 Mod AS, valve area (VTI) - 1.5cm^2, (Vmax) - 1.25cm^2; b. 10/2015 TEE EF 55-60%, mod AS.  Marland Kitchen Paroxysmal atrial fibrillation ([place])     a. 06/2011 Post-op AF-->converted on amio;  b. 07/2015 TEE/unsuccessful DCCV x 2; c. 09/2015 recurrent AF/Flutter-->successful DCCV; d. 09/2015 Recurrent AF-->10/2015 s/p TEE (EF 55-60%, mod AS, mild MR, sev dil LA w/o LAA thrombus, sev dil RA, mild to mod TR) & DCCV (120 J x 1).  . Lumbar stenosis     a. s/p lumbar diskectomy 2017.  . Carotid arterial disease ([place])     a. 10/2015 Carotid US: bilat 1-39% heterogeneous plaque/stenosis. Nl subclavian arteries bilat.  . CKD (chronic kidney disease), stage III     a. 09/2015 AKI in setting of dehydration - seen @ [facility].   Past Surgical History  Procedure Laterality Date  . Rotator cuff repair Left 2000  . Colonoscopy    . Vasectomy  1979  . Coronary angioplasty with stent placement  02/2009    has one stent  . Carotid doppler  10/12    0-39% R and 60-79% left   .  Cataract extraction  2012  . Cardiac catheterization  2010  . Lithotripsy  2009  . Tee without cardioversion N/A 08/06/2015    Procedure: TRANSESOPHAGEAL ECHOCARDIOGRAM (TEE);  Surgeon: Skeet Latch, MD;  Location: [place];  Service: Cardiovascular;  Laterality: N/A;  . Cardioversion N/A 08/06/2015    Procedure: CARDIOVERSION;  Surgeon: Skeet Latch, MD;  Location: [facility] END[facility]COPY;  Service: Cardiovascular;  Laterality: N/A;  . Back surgery    . Lumbar laminectomy  07/2015  . Posterior laminectomy thoracic spine      "fixed the discs"  . Skin cancer excision      "arms"  . Tee without cardioversion N/A 09/14/2015    Procedure: TRANSESOPHAGEAL ECHOCARDIOGRAM (TEE);  Surgeon: Jerline Pain, MD;  Location: [place];  Service: Cardiovascular;  Laterality: N/A;  . Cardioversion N/A 09/14/2015    Procedure: CARDIOVERSION;  Surgeon: Jerline Pain, MD;  Location:  [place];  Service: Cardiovascular;  Laterality: N/A;  . Tee without cardioversion N/A 11/04/2015    Procedure: TRANSESOPHAGEAL ECHOCARDIOGRAM (TEE);  Surgeon: Sanda Klein, MD;  Location: [place];  Service: Cardiovascular;  Laterality: N/A;  . Cardioversion N/A 11/04/2015    Procedure: CARDIOVERSION;  Surgeon: Sanda Klein, MD;  Location: [facility] ENDOSCOPY;  Service: Cardiovascular;  Laterality: N/A;    Allergies  Allergies  Allergen Reactions  . Ace Inhibitors Other (See Comments)    Causes high K   . Angiotensin Receptor Blockers Other (See Comments)    Causes high K   . Metoprolol Other (See Comments)    May cause elevated K level   . Penicillins Swelling and Rash    Has patient had a PCN reaction causing immediate rash, facial/tongue/throat swelling, SOB or lightheadedness with hypotension: YES Has patient had a PCN reaction causing severe rash involving mucus membranes or skin necrosis: NO Has patient had a PCN reaction that required hospitalization NO Has patient had a PCN reaction occurring within the last 10 years: NO If all of the above answers are "NO", then may proceed with Cephalosporin use. Has patient had a PCN reaction causing immediate rash, facial/tongue/throat swelling, SOB or lightheadedness with hypotension: YES Has patient had a PCN reaction causing severe rash involving mucus membranes or skin necrosis: NO Has patient had a PCN reaction that required hospitalization NO Has patient had a PCN reaction occurring within the last 10 years: NO If all of the above answers are "NO", then may proceed with Cephalosporin use.  . Tetracycline Swelling and Rash    History of Present Illness    75 y/o ? with the above complex PMH including CAD s/p BMS of the LCX in 2010 (low risk myoview in 04/2015), GIB 2/2 AVM's in 2010, PMR on chronic steroids, HTN, HL, DM II, CKD III, chronic back pain, and PAF.  He has had multiple hospitalizations this year with lumbar  laminectomy in [id number] complicated by development of PNA/VDRF and Afib with unsuccessful DCCV on 08/06/2015, though he later converted to sinus rhythm on amio therapy.  He was readmitted in 08/2015 with atypical chest pain and mild troponin elevation. In light of non-ischemic MV in 04/2015 along with CKD III, medical Rx was recommended.  He was d/c'd and readmitted 3/30 with chest pain and recurrent AF/Flutter.  Amio was titrated to 400 mg daily and dilt was added.  He then underwent successful DCCV on 09/14/2015.  Following d/c, he developed dehydration, AKI, and leukocytosis noted on outpt labs.  He presented to [facility] for second opinion and was admitted.  ECG from 4/13 showed Aflutter with variable response.  His diuretics were held and he was treated for bronchitis.  He was later d/c'd in stable condition.  He f/u with primary care and later Afib clinic due to ongoing symptomatic afib/flutter and after an appropriate duration of oral anticoagulation, he underwent successful outpt TEE and DCCV on 11/04/2015.  Unfortunately, beginning on the morning of 5/26, he noted recurrent right sided pleuritic chest "congestion" and discomfort associated with a globus sensation, which he has had before in the setting of atrial flutter.  His HR's @ home have been running in the 110's to 120's.  He has not been noticing palpitations, dyspnea, presyncope, or syncope.  Amiodarone increased to 200mg  bid  Seen by Dr Curt Bears 11/16/15 and offered flutter ablation.   Not clear why it was delayed until 01/10/16  Patient with rapid flutter in office today and willing to move procedure up  Home Medications    Prior to Admission medications   Medication Sig Start Date End Date Taking? Authorizing Provider  acetaminophen (TYLENOL) 325 MG tablet Take 650 mg by mouth every 6 (six) hours as needed (Pain scale of 1-4 or temp > 101).    Historical Provider, MD  albuterol (PROVENTIL HFA;VENTOLIN HFA) 108 (90 Base) MCG/ACT inhaler Inhale 2 puffs  into the lungs every 6 (six) hours as needed for wheezing or shortness of breath. 11/10/15   Abner Greenspan, MD  amiodarone (PACERONE) 200 MG tablet Take 200mg  by mouth twice daily until 5/4 then decrease to 200mg  (1 tablet) daily Patient taking differently: Take 200 mg by mouth daily. Take 200mg  by mouth twice daily until 5/4 then decrease to 200mg  (1 tablet) daily 10/07/15   Sherran Needs, NP  calcium-vitamin D (OSCAL WITH D) 500-200 MG-UNIT tablet Take 1 tablet by mouth daily with breakfast.    Historical Provider, MD  diltiazem (CARDIZEM CD) 240 MG 24 hr capsule TAKE 1 CAPSULE BY MOUTH DAILY 10/14/15   Abner Greenspan, MD  docusate sodium (COLACE) 100 MG capsule Take 100 mg by mouth 2 (two) times daily. Reported on 09/22/2015    Historical Provider, MD  ferrous sulfate 325 (65 FE) MG tablet Take 1 tablet (325 mg total) by mouth 3 (three) times daily with meals. 10/26/15   Abner Greenspan, MD  furosemide (LASIX) 20 MG tablet TAKE 1 TABLET BY MOUTH DAILY 10/14/15   Abner Greenspan, MD  glipiZIDE (GLUCOTROL) 10 MG tablet Take 1 tablet (10 mg total) by mouth 2 (two) times daily before a meal. 10/29/15   [place], MD  glucose blood (ONE TOUCH ULTRA TEST) test strip Check blood sugar once daily and as directed. Dx E11.8 10/14/15   Abner Greenspan, MD  pantoprazole (PROTONIX) 40 MG tablet Take 1 tablet (40 mg total) by mouth daily. 10/11/15   Tonia Ghent, MD  predniSONE (DELTASONE) 10 MG tablet Take 1 tablet (10 mg total) by mouth daily with breakfast. 10/21/15   Abner Greenspan, MD  thiamine 100 MG tablet Take 1 tablet (100 mg total) by mouth daily. 10/11/15   Tonia Ghent, MD  warfarin (COUMADIN) 2.5 MG tablet Take as directed by anti-coagulation clinic. 10/04/15   Abner Greenspan, MD    Review of Systems    As above, he has been noticing right sided chest "congestion" and pleuritic discomfort associated with a globus sensation.  He denies dyspnea, pnd, orthopnea, n, v, dizziness, syncope, edema, weight gain, or  early satiety.  All other systems reviewed and are otherwise negative except as noted above.  Physical Exam    VS:  BP 120/64 mmHg  Pulse 114  Ht 5\' 8"  (1.727 m)  Wt 73.991 kg (163 lb 1.9 oz)  BMI 24.81 kg/m2  SpO2 97% , BMI Body mass index is 24.81 kg/(m^2). GEN: Well nourished, well developed, in no acute distress. HEENT: normal. Neck: Supple, no JVD, carotid bruits, or masses. Cardiac: RRR, tachycardic, 2/6 SEM RUSB, no rubs or gallops. No clubbing, cyanosis, edema.  Radials/DP/PT 2+ and equal bilaterally.  Respiratory:  Respirations regular and unlabored, clear to auscultation bilaterally. GI: Soft, nontender, nondistended, BS + x 4. MS: no deformity or atrophy. Skin: warm and dry, no rash. Neuro:  Strength and sensation are intact. Psych: Normal affect.  Accessory Clinical Findings    ECG - 6/12  Flutter rate 115   Assessment & Plan    1.  Paroxysmal Atrial Flutter w/ h/o paroxysmal Afib:  Pt presents today in the setting of recurrent atrial flutter following successful initially successful TEE/DCCV on 5/25.  Unfortunately, he was likely back in flutter by the morning of 5/26, when he noted recurrent right sided pleuritic chest discomfort and congestion, associated with a globus sensation.  He previously had Afib in the setting of respiratory illness and post-op back surgery in 07/2015 with failed DCCV in late Feb followed by subsequent conversion on amio.  He was noted to have atrial flutter in setting of c/p in late march/early April and underwent DCCV in April, which appears to have lasted only a week, and repeat TEE/DCCV on 5/25.  He is currently on amio 200 daily and dilt 240 daily.  SBP is 100.  Recent TEE showed nl EF with severe bi-atrial enlargement.  Seen by DR Curt Bears 6/6 and flutter ablation arranged  He remains on coumadin and INR RX   This is followed by primary care.  NOAC not considered .  Concerned re: labile INR's.  Hospital note from 2/20 indicated that warfarin was  chosen due to h/o GIB in 2010 w/ AVM's (ablated), and ability to reverse warfarin.  H/H has been stable on coumadin   Personally spoke with Dr Curt Bears today to move flutter ablation up. Initially scheduled for 7/31 but in office today  Patient in flutter at rate of 115.  Able to find a date on July 5th to do procedure. Also discussed possible need for F/u afib ablation if this recurs after flutter ablated  2.  CAD:  S/P BMS to the LCX in 2010 @ [facility].  Low risk nuc study in 04/2015.  He has had some chest discomfort over the course of the past 5 months, always in the setting of atrial flutter.  He is not on ASA 2/2 coumadin anticoagulation. No  blocker 2/2 needing dilt and soft BP.  Resume previous dose of pravachol - stopped for unclear reasons following prolonged hospitalization in 07/2015 and 08/2015.  LFT's nl on 10/25/2015.  3.  Hypertensive Heart Dzs:  Stable on dilt.  4.  HL:  Resume pravachol as above.  5.  DM II:  Oral agent per primary care.  6.  Chronic Diastolic CHF:  euvolemic on exam.  Not currently on lasix in setting of CKD III with AKI in mid April.  He weighs himself @ home and this has been stable.  HR elevated in setting of #1, though BP stable.  We discussed the importance of daily weights, sodium restriction, medication compliance, and symptom reporting and  he verbalizes understanding.   7.  CKD III:  Creat recently stable.  8.  PMR:  On oral steroids. Has rheum f/u.  Jenkins Rouge

## 2015-11-22 ENCOUNTER — Encounter: Payer: Self-pay | Admitting: Cardiovascular Disease

## 2015-11-22 ENCOUNTER — Ambulatory Visit (INDEPENDENT_AMBULATORY_CARE_PROVIDER_SITE_OTHER): Payer: Medicare Other | Admitting: Cardiovascular Disease

## 2015-11-22 ENCOUNTER — Encounter: Payer: Self-pay | Admitting: *Deleted

## 2015-11-22 ENCOUNTER — Telehealth: Payer: Self-pay | Admitting: Cardiovascular Disease

## 2015-11-22 VITALS — BP 120/64 | HR 114 | Ht 68.0 in | Wt 163.1 lb

## 2015-11-22 DIAGNOSIS — I480 Paroxysmal atrial fibrillation: Secondary | ICD-10-CM

## 2015-11-22 DIAGNOSIS — I6523 Occlusion and stenosis of bilateral carotid arteries: Secondary | ICD-10-CM | POA: Diagnosis not present

## 2015-11-22 DIAGNOSIS — I2510 Atherosclerotic heart disease of native coronary artery without angina pectoris: Secondary | ICD-10-CM

## 2015-11-22 DIAGNOSIS — Z01812 Encounter for preprocedural laboratory examination: Secondary | ICD-10-CM | POA: Diagnosis not present

## 2015-11-22 DIAGNOSIS — I483 Typical atrial flutter: Secondary | ICD-10-CM

## 2015-11-22 NOTE — Telephone Encounter (Signed)
New Message  Pt dtr wanted pt to discuss w/ Dr Johnsie Cancel a few issues at today's 6/12 appt- amiodarone dosage change- if pt wanted continue 2xday regimen. Also, pt wanted to discuss if Echo scheduled for later in June is still necessary- Pt is to keep Pre-op ablation appt w/[place] on 7/26 per Sherri. Please call back and discuss.

## 2015-11-22 NOTE — Telephone Encounter (Signed)
Patient's concerns and questions were answered at patient's office visit today. See office note.

## 2015-11-22 NOTE — Patient Instructions (Addendum)
Medication Instructions:  Your physician recommends that you continue on your current medications as directed. Please refer to the Current Medication list given to you today.  Labwork: Your physician recommends that you return for lab work in: 12/08/15 for lab work BMET, CBC, and PT/INR.  Testing/Procedures: Your physician has recommended that you have an ablation December 15, 2015. Catheter ablation is a medical procedure used to treat some cardiac arrhythmias (irregular heartbeats). During catheter ablation, a long, thin, flexible tube is put into a blood vessel in your groin (upper thigh), or neck. This tube is called an ablation catheter. It is then guided to your heart through the blood vessel. Radio frequency waves destroy small areas of heart tissue where abnormal heartbeats may cause an arrhythmia to start. Please see the instruction sheet given to you today.  Follow-Up: Your physician wants you to follow-up in: 3 months with Dr. Johnsie Cancel.   Your physician recommends that you schedule a follow-up appointment in: 7 weeks with Dr. Curt Bears.    If you need a refill on your cardiac medications before your next appointment, please call your pharmacy.

## 2015-11-23 ENCOUNTER — Encounter: Payer: Self-pay | Admitting: *Deleted

## 2015-11-23 ENCOUNTER — Other Ambulatory Visit: Payer: Self-pay | Admitting: *Deleted

## 2015-11-23 ENCOUNTER — Other Ambulatory Visit: Payer: Medicare Other

## 2015-11-23 ENCOUNTER — Ambulatory Visit: Payer: Medicare Other

## 2015-11-23 NOTE — Patient Outreach (Addendum)
[facility]) Care Management   11/23/2015  Curtis Frazier DEC [birth year] [id number]  Curtis Frazier is an 75 y.o. male  Subjective:  Patient reports hip discomfort has improved since he  received a half infusion of medication from his  rheumatoid doctor  for his hip arthritis.  Curtis Frazier discussed his upcoming ablation procedure in July for his atrial flutter. Curtis Frazier discussed concern regarding his blood sugars running higher, even though he is not eating any more and is taking his medication , patient discussed that he doesn't want to take insulin injections. Curtis Frazier reports that the home health RN is visiting twice weekly, but will reduce visits to once weekly soon, physical therapy has completed services.  Objective:  BP 104/60 mmHg  Pulse 79  Resp 18  Wt 159 lb 12.8 oz (72.485 kg)  SpO2 95% Review of Systems  Constitutional: Negative.   HENT: Negative.   Eyes: Negative.   Respiratory: Positive for cough.   Cardiovascular: Negative for chest pain and leg swelling.  Gastrointestinal: Negative.   Genitourinary: Negative.   Musculoskeletal: Positive for joint pain.       Bilateral hip pain  Skin: Negative.   Neurological: Negative.   Psychiatric/Behavioral: Negative.     Physical Exam  Constitutional: He is oriented to person, place, and time. He appears well-developed and well-nourished.  Cardiovascular: An irregular rhythm present.  Respiratory: Effort normal.  GI: Soft.  Neurological: He is alert and oriented to person, place, and time.  Skin: Skin is warm and dry.  Psychiatric: He has a normal mood and affect. His behavior is normal. Judgment and thought content normal.    Encounter Medications:   Outpatient Encounter Prescriptions as of 11/23/2015  Medication Sig  . acetaminophen (TYLENOL) 325 MG tablet Take 650 mg by mouth every 6 (six) hours as needed (Pain scale of 1-4 or temp > 101).  Marland Kitchen albuterol (PROVENTIL HFA;VENTOLIN HFA) 108 (90 Base) MCG/ACT inhaler  Inhale 2 puffs into the lungs every 6 (six) hours as needed for wheezing or shortness of breath.  Marland Kitchen amiodarone (PACERONE) 200 MG tablet Take 1 tablet (200 mg total) by mouth 2 (two) times daily.  . calcium-vitamin D (OSCAL WITH D) 500-200 MG-UNIT tablet Take 1 tablet by mouth daily with breakfast.  . diltiazem (CARDIZEM CD) 240 MG 24 hr capsule TAKE 1 CAPSULE BY MOUTH DAILY  . docusate sodium (COLACE) 100 MG capsule Take 100 mg by mouth 2 (two) times daily. Reported on 09/22/2015  . ferrous sulfate 325 (65 FE) MG tablet Take 1 tablet (325 mg total) by mouth 3 (three) times daily with meals.  Marland Kitchen glipiZIDE (GLUCOTROL) 10 MG tablet Take 1 tablet (10 mg total) by mouth 2 (two) times daily before a meal.  . glucose blood (ONE TOUCH ULTRA TEST) test strip Check blood sugar three times daily and as directed. Dx E11.8 diabetes that is poorly controlled  . pantoprazole (PROTONIX) 40 MG tablet Take 1 tablet (40 mg total) by mouth daily.  . pravastatin (PRAVACHOL) 20 MG tablet Take 1 tablet (20 mg total) by mouth every evening.  . predniSONE (DELTASONE) 10 MG tablet Take 1 tablet (10 mg total) by mouth daily with breakfast.  . thiamine 100 MG tablet Take 1 tablet (100 mg total) by mouth daily.  Marland Kitchen warfarin (COUMADIN) 2.5 MG tablet Take as directed by anti-coagulation clinic.   No facility-administered encounter medications on file as of 11/23/2015.    Functional Status:   In your present state of health, do  you have any difficulty performing the following activities: 10/13/2015 09/08/2015  Hearing? N -  Vision? N -  Difficulty concentrating or making decisions? N -  Walking or climbing stairs? Y -  Dressing or bathing? N -  Doing errands, shopping? [name]  Preparing Food and eating ? N -  Using the Toilet? N -  In the past six months, have you accidently leaked urine? Y -  Do you have problems with loss of bowel control? N -  Managing your Medications? Y -  Managing your Finances? Y -  Housekeeping or  managing your Housekeeping? N -    Fall/Depression Screening:    PHQ 2/9 Scores 10/04/2015 06/28/2015 11/23/2014 07/07/2013 07/05/2012  PHQ - 2 Score 0 0 0 0 0    Assessment:  Routine home visit.  Diabetes- patient concerned regarding blood sugars running 137 to 200's in the morning, reviewed diet information, patient drinks regular soda at times.  Atrial Flutter - Benefit from  procedure education.  Heart Failure- patient weighs daily and records, no weight increases greater than 3 pounds in a day or 5 pounds in a week, denies increased shortness of breath,has a cough reports occasional productive, taking mucinex. Patient records blood pressure daily, within normal range. 113-110/58.  Fall Risk- no recent falls, patient uses his cane at all times, reinforced to continue exercises taught by physical therapy.  Discussed health coach program for continued education and management of chronic disease, patient agreeable to follow up for Diabetes.  Plan:  RN will plan return home visit in one month RN will continue to provided and reinforce education on Diabetes, heart failure Patient collaborative goals reviewed and updated.  THN CM Care Plan Problem One        Most Recent Value   Care Plan Problem One  Recent Hospitalization with Atrial fibrillation   Role Documenting the Problem One  Care Management Coordinator   Care Plan for Problem One  Not Active   THN Long Term Goal (31-90 days)  Patient will not readmit to hospital in the next 31 days   THN Long Term Goal Start Date  09/27/15 [name] restarted due to admission at [facility] on 4/13-4/15]   [facility] Long Term Goal Met Date  11/05/15   THN CM Short Term Goal #1 (0-30 days)  Patient will attend PCP appointment in the next 7 days   THN CM Short Term Goal #1 Start Date  09/27/15 [date restarted. ]   THN CM Short Term Goal #1 Met Date  10/04/15   THN CM Short Term Goal #2 (0-30 days)  Patient/caregiver  will purchase scales and begin to weigh daily  and record in the next 30 days.   THN CM Short Term Goal #2 Start Date  09/27/15 [name] restarted due to admission]   [facility] CM Short Term Goal #2 Met Date  11/23/15 [patient has scales from [place] home health]   THN CM Short Term Goal #3 (0-30 days)  Patient will report home health being active in the next 7 days.   THN CM Short Term Goal #3 Start Date  09/27/15   THN CM Short Term Goal #3 Met Date  10/04/15   THN CM Short Term Goal #4 (0-30 days)  Patient will monitor and record blood sugars three times daily in the next 30 days [goal adjusted ]   THN CM Short Term Goal #4 Start Date  10/04/15   [facility] CM Short Term Goal #4 Met Date  10/21/15  THN CM Short Term Goal #5 (0-30 days)  Patient will be able to report at least 3 signs of heart failure symptoms to notify MD of .in the next 30 days    THN CM Short Term Goal #5 Start Date  10/13/15   [facility] CM Short Term Goal #5 Met Date  10/28/15    [facility] CM Care Plan Problem Two        Most Recent Value   Care Plan Problem Two  Atrial Fibrillation self care management   Role Documenting the Problem Two  Care Management Coordinator   Care Plan for Problem Two  Active   Interventions for Problem Two Long Term Goal   -- [patient reports that he is now in atrial flutter]   THN Long Term Goal (31-90) days  Patient will report increased knowledge self care management of atrial fibrillation in the nexrt 20 days   THN Long Term Goal Start Date  10/21/15   THN Long Term Goal Met Date  10/28/15   THN CM Short Term Goal #1 (0-30 days)  Patient will be able to state diet recommendations and follow  while taking coumadin in the next 14 days   THN CM Short Term Goal #1 Start Date  10/21/15   [facility] CM Short Term Goal #1 Met Date   11/05/15   THN CM Short Term Goal #2 (0-30 days)  Patient will continue with daily exercise as taught by physical therapy in the next 14 days   THN CM Short Term Goal #2 Start Date  10/21/15   [facility] CM Short Term Goal #2 Met Date  11/05/15   THN  CM Short Term Goal #3 (0-30 days)  Patient will verbalize increased knowledge of Ablation procedure in the next 20 days    THN CM Short Term Goal #3 Start Date  11/23/15   Interventions for Short Term Goal #3  Educated on Ablation procedure by reviewiing patient education in after care section.   THN CM Short Term Goal #4 (0-30 days)  Patient will be able verbalize action plan for chest pain in the next 20 days    THN CM Short Term Goal #4 Start Date  11/05/15   [facility] CM Short Term Goal #4 Met Date  11/23/15    [facility] CM Care Plan Problem Three        Most Recent Value   Care Plan Problem Three  Diabetes, elevated A1c   Role Documenting the Problem Three  Care Management Coordinator   Care Plan for Problem Three  Active   THN Long Term Goal (31-90) days  Patient will report increased knowledge of Diabetes education and managment in the next 30 days   THN Long Term Goal Start Date  11/23/15   Interventions for Problem Three Long Term Goal  Discussed with patient the importance of keeping blood sugar undercontrol and benefits    THN CM Short Term Goal #1 (0-30 days)  Patient will monitor blood sugars at least twice daily in the next 30 days    THN CM Short Term Goal #1 Start Date  11/23/15   Interventions for Short Term Goal #1  Encouraged patient to monitor and record blood sugars at least twice daily and record, taking record to PCP visit , as information can be used to help MD adjust medication plan   THN CM Short Term Goal #2 (0-30 days)  Patient will be able to verbalize increased knowledge of Diabetes diet and portion sizes in  the next 30 days   THN CM Short Term Goal #2 Start Date  11/23/15   Interventions for Short Term Goal #2  RN reviewed carbohydrate portion size poster and reviewed labels on food products he had in home.     [name], RN, [facility] Management [phone]- Mobile [phone]- Toll Free Main Office

## 2015-11-24 ENCOUNTER — Other Ambulatory Visit (HOSPITAL_COMMUNITY): Payer: Medicare Other

## 2015-11-26 ENCOUNTER — Encounter: Payer: Medicare Other | Admitting: Family Medicine

## 2015-11-26 ENCOUNTER — Telehealth: Payer: Self-pay

## 2015-11-26 NOTE — Telephone Encounter (Signed)
I do not receive the faxes but I checked with Morey Hummingbird and the fax is in Dr Rometta Emery in box that was faxed on 11/25/15 from [facility].

## 2015-11-26 NOTE — Telephone Encounter (Signed)
Dr Diona Browner, Gina's last day was 11/19/15 and I was advised to send PT/INRs to physicians.

## 2015-11-26 NOTE — Telephone Encounter (Signed)
Left message for Lambert Keto that Warfarin dose is unchanged per Dr. Diona Browner and form has been faxed back to [facility] [phone].

## 2015-11-26 NOTE — Telephone Encounter (Signed)
Curtis Frazier,  Where are the lab results that were faxed on 6/15 per your message??

## 2015-11-26 NOTE — Telephone Encounter (Signed)
Curtis Frazier.. It appears if the labs faxed were from 6/1... No changes were made and he needs to come in to be checked. Verify this with the patient. He may be having labs done elsewhere.

## 2015-11-26 NOTE — Telephone Encounter (Signed)
Talk with GIna as she is following at coumadin clinic.  It looks like no changes made at last INR in 11/11/2015.. But it appears he was to be rechecked per GIUNa's note on 6/15.. Did he?  Routed to [place]

## 2015-11-26 NOTE — Telephone Encounter (Signed)
Curtis Frazier with [facility] left v/m requesting cb about PT/INR results that were faxed to [facility] on 11/25/15; any changes to coumadin or leave the same.Dr Glori Bickers out of office until 11/30/15.

## 2015-11-26 NOTE — Telephone Encounter (Signed)
Curtis Frazier called about the Pt/inr results Best number [phone]

## 2015-12-02 ENCOUNTER — Other Ambulatory Visit (HOSPITAL_COMMUNITY): Payer: Medicare Other

## 2015-12-03 ENCOUNTER — Telehealth: Payer: Self-pay

## 2015-12-03 ENCOUNTER — Other Ambulatory Visit: Payer: Self-pay | Admitting: Family Medicine

## 2015-12-03 MED ORDER — PREDNISONE 5 MG PO TABS: 5.0000 mg | ORAL_TABLET | Freq: Every day | ORAL | Status: DC

## 2015-12-03 NOTE — Telephone Encounter (Signed)
done

## 2015-12-03 NOTE — Telephone Encounter (Signed)
[name] DPR signed left v/m  wants to verify PT INR is being done. See 11/26/15 phone note. Left v/m for [name] to call [facility].

## 2015-12-03 NOTE — Telephone Encounter (Signed)
See note from pharmacy it says pt is requesting 5mg  tabs instead of 10mg  tabs

## 2015-12-03 NOTE — Telephone Encounter (Signed)
The 10 mg already dropped down-I can't get rid of it  Please replace it with 5 mg -same inst take one daily #30 no ref  Thanks

## 2015-12-04 ENCOUNTER — Telehealth: Payer: Self-pay | Admitting: Family Medicine

## 2015-12-04 DIAGNOSIS — N183 Chronic kidney disease, stage 3 unspecified: Secondary | ICD-10-CM

## 2015-12-04 DIAGNOSIS — D638 Anemia in other chronic diseases classified elsewhere: Secondary | ICD-10-CM

## 2015-12-04 DIAGNOSIS — R946 Abnormal results of thyroid function studies: Secondary | ICD-10-CM

## 2015-12-04 DIAGNOSIS — I110 Hypertensive heart disease with heart failure: Secondary | ICD-10-CM

## 2015-12-04 DIAGNOSIS — E785 Hyperlipidemia, unspecified: Secondary | ICD-10-CM

## 2015-12-04 DIAGNOSIS — N400 Enlarged prostate without lower urinary tract symptoms: Secondary | ICD-10-CM

## 2015-12-04 NOTE — Telephone Encounter (Signed)
-----   Message from Marchia Bond sent at 12/03/2015  1:49 PM EDT ----- Regarding: Cpx labs Fri 6/30, need orders. Thanks! :-) Please order  future cpx labs for pt's upcoming lab appt. Thanks Aniceto Boss

## 2015-12-06 ENCOUNTER — Ambulatory Visit: Payer: Medicare Other | Admitting: Family Medicine

## 2015-12-07 ENCOUNTER — Ambulatory Visit (INDEPENDENT_AMBULATORY_CARE_PROVIDER_SITE_OTHER): Payer: Medicare Other | Admitting: Internal Medicine

## 2015-12-07 ENCOUNTER — Encounter: Payer: Self-pay | Admitting: Internal Medicine

## 2015-12-07 VITALS — BP 112/68 | HR 100 | Temp 98.7°F | Resp 20 | Ht 67.0 in | Wt 165.5 lb

## 2015-12-07 DIAGNOSIS — I509 Heart failure, unspecified: Secondary | ICD-10-CM | POA: Diagnosis not present

## 2015-12-07 DIAGNOSIS — E119 Type 2 diabetes mellitus without complications: Secondary | ICD-10-CM | POA: Diagnosis not present

## 2015-12-07 DIAGNOSIS — J449 Chronic obstructive pulmonary disease, unspecified: Secondary | ICD-10-CM | POA: Diagnosis not present

## 2015-12-07 DIAGNOSIS — H6983 Other specified disorders of Eustachian tube, bilateral: Secondary | ICD-10-CM | POA: Diagnosis not present

## 2015-12-07 DIAGNOSIS — I6523 Occlusion and stenosis of bilateral carotid arteries: Secondary | ICD-10-CM

## 2015-12-07 MED ORDER — TIOTROPIUM BROMIDE MONOHYDRATE 18 MCG IN CAPS: 18.0000 ug | ORAL_CAPSULE | Freq: Every day | RESPIRATORY_TRACT | Status: DC

## 2015-12-07 NOTE — Patient Instructions (Signed)
Chronic Obstructive Pulmonary Disease Chronic obstructive pulmonary disease (COPD) is a common lung condition in which airflow from the lungs is limited. COPD is a general term that can be used to describe many different lung problems that limit airflow, including both chronic bronchitis and emphysema. If you have COPD, your lung function will probably never return to normal, but there are measures you can take to improve lung function and make yourself feel better. CAUSES   Smoking (common).  Exposure to secondhand smoke.  Genetic problems.  Chronic inflammatory lung diseases or recurrent infections. SYMPTOMS  Shortness of breath, especially with physical activity.  Deep, persistent (chronic) cough with a large amount of thick mucus.  Wheezing.  Rapid breaths (tachypnea).  Buttrey or bluish discoloration (cyanosis) of the skin, especially in your fingers, toes, or lips.  Fatigue.  Weight loss.  Frequent infections or episodes when breathing symptoms become much worse (exacerbations).  Chest tightness. DIAGNOSIS Your health care provider will take a medical history and perform a physical examination to diagnose COPD. Additional tests for COPD may include:  Lung (pulmonary) function tests.  Chest X-ray.  CT scan.  Blood tests. TREATMENT  Treatment for COPD may include:  Inhaler and nebulizer medicines. These help manage the symptoms of COPD and make your breathing more comfortable.  Supplemental oxygen. Supplemental oxygen is only helpful if you have a low oxygen level in your blood.  Exercise and physical activity. These are beneficial for nearly all people with COPD.  Lung surgery or transplant.  Nutrition therapy to gain weight, if you are underweight.  Pulmonary rehabilitation. This may involve working with a team of health care providers and specialists, such as respiratory, occupational, and physical therapists. HOME CARE INSTRUCTIONS  Take all medicines  (inhaled or pills) as directed by your health care provider.  Avoid over-the-counter medicines or cough syrups that dry up your airway (such as antihistamines) and slow down the elimination of secretions unless instructed otherwise by your health care provider.  If you are a smoker, the most important thing that you can do is stop smoking. Continuing to smoke will cause further lung damage and breathing trouble. Ask your health care provider for help with quitting smoking. He or she can direct you to community resources or hospitals that provide support.  Avoid exposure to irritants such as smoke, chemicals, and fumes that aggravate your breathing.  Use oxygen therapy and pulmonary rehabilitation if directed by your health care provider. If you require home oxygen therapy, ask your health care provider whether you should purchase a pulse oximeter to measure your oxygen level at home.  Avoid contact with individuals who have a contagious illness.  Avoid extreme temperature and humidity changes.  Eat healthy foods. Eating smaller, more frequent meals and resting before meals may help you maintain your strength.  Stay active, but balance activity with periods of rest. Exercise and physical activity will help you maintain your ability to do things you want to do.  Preventing infection and hospitalization is very important when you have COPD. Make sure to receive all the vaccines your health care provider recommends, especially the pneumococcal and influenza vaccines. Ask your health care provider whether you need a pneumonia vaccine.  Learn and use relaxation techniques to manage stress.  Learn and use controlled breathing techniques as directed by your health care provider. Controlled breathing techniques include:  Pursed lip breathing. Start by breathing in (inhaling) through your nose for 1 second. Then, purse your lips as if you were   going to whistle and breathe out (exhale) through the  pursed lips for 2 seconds.  Diaphragmatic breathing. Start by putting one hand on your abdomen just above your waist. Inhale slowly through your nose. The hand on your abdomen should move out. Then purse your lips and exhale slowly. You should be able to feel the hand on your abdomen moving in as you exhale.  Learn and use controlled coughing to clear mucus from your lungs. Controlled coughing is a series of short, progressive coughs. The steps of controlled coughing are: 1. Lean your head slightly forward. 2. Breathe in deeply using diaphragmatic breathing. 3. Try to hold your breath for 3 seconds. 4. Keep your mouth slightly open while coughing twice. 5. Spit any mucus out into a tissue. 6. Rest and repeat the steps once or twice as needed. SEEK MEDICAL CARE IF:  You are coughing up more mucus than usual.  There is a change in the color or thickness of your mucus.  Your breathing is more labored than usual.  Your breathing is faster than usual. SEEK IMMEDIATE MEDICAL CARE IF:  You have shortness of breath while you are resting.  You have shortness of breath that prevents you from:  Being able to talk.  Performing your usual physical activities.  You have chest pain lasting longer than 5 minutes.  Your skin color is more cyanotic than usual.  You measure low oxygen saturations for longer than 5 minutes with a pulse oximeter. MAKE SURE YOU:  Understand these instructions.  Will watch your condition.  Will get help right away if you are not doing well or get worse.   This information is not intended to replace advice given to you by your health care provider. Make sure you discuss any questions you have with your health care provider.   Document Released: 03/08/2005 Document Revised: 06/19/2014 Document Reviewed: 01/23/2013 Elsevier Interactive Patient Education 2016 [employer]  

## 2015-12-07 NOTE — Progress Notes (Signed)
HPI  Pt presents to the clinic today with c/o cough and chest congestion. This started about 3 months ago. The cough is productive of cream colored mucous. He does have associated runny nose, ear fullness and horseness. He denies fever, chills or body aches. He has taken Tylenol, Mucinex and Albuterol without any relief. He was seen 5/31 for the same by Dr. Glori Bickers. She felt like it was viral URI. Chest xray at that time showed changes consistent with COPD, she prescribed Albuterol inhaler. He also has a history of CHF and DM 2. Flu and pneumonia vaccines UTD. He has not had sick contacts.  Review of Systems      Past Medical History  Diagnosis Date  . CAD (coronary artery disease)     a. 2010 s/p MI/Cath/PCI ([facility]): LM 10, LAD 30p, 87m, D1 20, D2 30, LCX 30p, 70m (3.5 x 15 Driver BMS), [id number], RCA 70p, 58m; b. 04/2011 Neg MV;  c. 04/2015 Myoview: very mild apical thinning, no ischemia, EF 65%.  . Hyperlipidemia     a. takes pravachol.  . Personal history of colonic polyps   . Actinic keratosis   . Psoriatic arthropathy ([place])   . Other psoriasis   . Scoliosis (and kyphoscoliosis), idiopathic   . GI bleed     a. 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  . Gastritis 12/10  . Hypertensive heart disease     a. takes Metoprolol daily  . Dyspnea on exertion   . Headache(784.0)     occasionally  . Joint pain   . Chronic back pain     scoliosis/stenosis/radiculopathy,degenerative disc disease  . Psoriasis   . Bruises easily   . Esophageal reflux     takes Omeprazole daily  . History of colonic polyps   . Hemorrhoids   . History of nephrolithiasis   . History of blood transfusion 2010  . PMR (polymyalgia rheumatica) (HCC) 01/20/2012    tx by specialist on a long prednisone taper  . Rheumatoid arthritis(714.0)     takes Methotrexate 7pills weekly  . Degeneration of intervertebral disc, site unspecified   . History of pneumonia 2008; 07/2015  . Type II or unspecified type diabetes mellitus  without mention of complication, not stated as uncontrolled   . Skin cancer     "arms"  . Chronic diastolic CHF (congestive heart failure) ([place])     a. 08/2015 Echo: EF 55-60%, mild LVH, no rwma, Gr1 DD, mod AS, mildly dil LA, PASP 31mmHg.  . Moderate aortic stenosis     a. 08/2015 Mod AS, valve area (VTI) - 1.5cm^2, (Vmax) - 1.25cm^2; b. 10/2015 TEE EF 55-60%, mod AS.  Marland Kitchen Paroxysmal atrial fibrillation ([place])     a. 06/2011 Post-op AF-->converted on amio;  b. 07/2015 TEE/unsuccessful DCCV x 2; c. 09/2015 recurrent AF/Flutter-->successful DCCV; d. 09/2015 Recurrent AF-->10/2015 s/p TEE (EF 55-60%, mod AS, mild MR, sev dil LA w/o LAA thrombus, sev dil RA, mild to mod TR) & DCCV (120 J x 1).  . Lumbar stenosis     a. s/p lumbar diskectomy 2017.  . Carotid arterial disease ([place])     a. 10/2015 Carotid US: bilat 1-39% heterogeneous plaque/stenosis. Nl subclavian arteries bilat.  . CKD (chronic kidney disease), stage III     a. 09/2015 AKI in setting of dehydration - seen @ [facility].    Family History  Problem Relation Age of Onset  . Coronary artery disease Mother   . Diabetes Mother   . CAD Mother   .  Coronary artery disease Sister   . Hypertension Sister   . Kidney failure Sister   . CAD Sister   . Diabetes Brother   . Prostate cancer Brother   . Pancreatic cancer Brother   . Diabetes Brother   . Anesthesia problems Neg Hx   . Hypotension Neg Hx   . Malignant hyperthermia Neg Hx   . Pseudochol deficiency Neg Hx     Social History   Social History  . Marital Status: Married    Spouse Name: N/A  . Number of Children: N/A  . Years of Education: N/A   Occupational History  . Not on file.   Social History Main Topics  . Smoking status: Former Smoker -- 1.50 packs/day for 53 years    Types: Cigarettes    Quit date: 11/22/2008  . Smokeless tobacco: Never Used     Comment: quit smoking cigarettes in 2010  . Alcohol Use: 0.0 oz/week    0 Standard drinks or equivalent per week     Comment:  08/11/2015 "might have 1-2 margaritas/month in the summertime"  . Drug Use: No  . Sexual Activity: No   Other Topics Concern  . Not on file   Social History Narrative    Allergies  Allergen Reactions  . Ace Inhibitors Other (See Comments)    Causes high K   . Angiotensin Receptor Blockers Other (See Comments)    Causes high K   . Metoprolol Other (See Comments)    May cause elevated K level   . Penicillins Swelling and Rash    Has patient had a PCN reaction causing immediate rash, facial/tongue/throat swelling, SOB or lightheadedness with hypotension: YES Has patient had a PCN reaction causing severe rash involving mucus membranes or skin necrosis: NO Has patient had a PCN reaction that required hospitalization NO Has patient had a PCN reaction occurring within the last 10 years: NO If all of the above answers are "NO", then may proceed with Cephalosporin use. Has patient had a PCN reaction causing immediate rash, facial/tongue/throat swelling, SOB or lightheadedness with hypotension: YES Has patient had a PCN reaction causing severe rash involving mucus membranes or skin necrosis: NO Has patient had a PCN reaction that required hospitalization NO Has patient had a PCN reaction occurring within the last 10 years: NO If all of the above answers are "NO", then may proceed with Cephalosporin use.  . Tetracycline Swelling and Rash     Constitutional: Denies headache, fatigue, fever or abrupt weight changes.  HEENT:  Positive ear fullness, runny nose. Denies eye redness, eye pain, pressure behind the eyes, facial pain, nasal congestion, ear pain, ringing in the ears, wax buildup, or bloody nose. Respiratory: Positive cough. Denies difficulty breathing or shortness of breath.  Cardiovascular: Denies chest pain, chest tightness, palpitations or swelling in the hands or feet.   No other specific complaints in a complete review of systems (except as listed in HPI above).  Objective:    BP 112/68 mmHg  Pulse 100  Temp(Src) 98.7 F (37.1 C) (Oral)  Resp 20  Ht 5\' 7"  (1.702 m)  Wt 165 lb 8 oz (75.07 kg)  BMI 25.91 kg/m2  SpO2 97% Wt Readings from Last 3 Encounters:  12/07/15 165 lb 8 oz (75.07 kg)  11/23/15 159 lb 12.8 oz (72.485 kg)  11/22/15 163 lb 1.9 oz (73.991 kg)     General: Appears his stated age, chronically ill appearing in NAD. HEENT: Head: normal shape and size, no sinus tenderness  noted; Eyes: sclera white, no icterus, conjunctiva pink; Ears: Tm's pink but intact, normal light reflex, +serous effusion bilaterally; Throat/Mouth: Teeth present, mucosa erythematous and moist, no exudate noted, no lesions or ulcerations noted.  Neck: No cervical lymphadenopathy.  Cardiovascular: Tachycardic with normal rhythm. Murmur noted. Pulmonary/Chest: Normal effort and positive vesicular breath sounds. No respiratory distress. No wheezes, rales or ronchi noted.  MSK: Barrel chest noted.    Assessment & Plan:   COPD:  Chest xray reviewed eRx for Spiriva, 1 inhalation daily Use Albuterol only if needed for wheezing/SOB  ETD, bilateral:  Flonase OTC, 1 spray each nostril daily x 1 week  RTC in 3 weeks, sooner if needed.  Webb Silversmith, NP

## 2015-12-07 NOTE — Telephone Encounter (Signed)
4 weeks

## 2015-12-07 NOTE — Telephone Encounter (Signed)
[name] @ [facility] called  She received PT pt/inr results 6/15 and wanted to know when this needed to be rescheduled Best number [phone]

## 2015-12-07 NOTE — Progress Notes (Signed)
Pre visit review using our clinic review tool, if applicable. No additional management support is needed unless otherwise documented below in the visit note. 

## 2015-12-08 ENCOUNTER — Other Ambulatory Visit (INDEPENDENT_AMBULATORY_CARE_PROVIDER_SITE_OTHER): Payer: Medicare Other | Admitting: *Deleted

## 2015-12-08 DIAGNOSIS — I2510 Atherosclerotic heart disease of native coronary artery without angina pectoris: Secondary | ICD-10-CM

## 2015-12-08 DIAGNOSIS — I480 Paroxysmal atrial fibrillation: Secondary | ICD-10-CM

## 2015-12-08 DIAGNOSIS — Z01812 Encounter for preprocedural laboratory examination: Secondary | ICD-10-CM

## 2015-12-08 LAB — BASIC METABOLIC PANEL
BUN: 21 mg/dL (ref 7–25)
CHLORIDE: 102 mmol/L (ref 98–110)
CO2: 23 mmol/L (ref 20–31)
Calcium: 8.8 mg/dL (ref 8.6–10.3)
Creat: 1.7 mg/dL — ABNORMAL HIGH (ref 0.70–1.18)
Glucose, Bld: 248 mg/dL — ABNORMAL HIGH (ref 65–99)
POTASSIUM: 4.6 mmol/L (ref 3.5–5.3)
Sodium: 138 mmol/L (ref 135–146)

## 2015-12-08 LAB — CBC WITH DIFFERENTIAL/PLATELET
BASOS PCT: 0 %
Basophils Absolute: 0 cells/uL (ref 0–200)
EOS ABS: 178 {cells}/uL (ref 15–500)
Eosinophils Relative: 2 %
HEMATOCRIT: 37.9 % — AB (ref 38.5–50.0)
HEMOGLOBIN: 12.4 g/dL — AB (ref 13.2–17.1)
Lymphocytes Relative: 25 %
Lymphs Abs: 2225 cells/uL (ref 850–3900)
MCH: 29.2 pg (ref 27.0–33.0)
MCHC: 32.7 g/dL (ref 32.0–36.0)
MCV: 89.2 fL (ref 80.0–100.0)
MONO ABS: 801 {cells}/uL (ref 200–950)
MPV: 9.7 fL (ref 7.5–12.5)
Monocytes Relative: 9 %
NEUTROS ABS: 5696 {cells}/uL (ref 1500–7800)
Neutrophils Relative %: 64 %
Platelets: 252 10*3/uL (ref 140–400)
RBC: 4.25 MIL/uL (ref 4.20–5.80)
RDW: 17.3 % — ABNORMAL HIGH (ref 11.0–15.0)
WBC: 8.9 10*3/uL (ref 3.8–10.8)

## 2015-12-08 LAB — PROTIME-INR
INR: 3 — ABNORMAL HIGH
PROTHROMBIN TIME: 30.2 s — AB (ref 9.0–11.5)

## 2015-12-08 NOTE — Telephone Encounter (Signed)
Lisa aware. 

## 2015-12-08 NOTE — Telephone Encounter (Signed)
Left message asking Curtis Frazier to call the office

## 2015-12-10 ENCOUNTER — Other Ambulatory Visit (INDEPENDENT_AMBULATORY_CARE_PROVIDER_SITE_OTHER): Payer: Medicare Other

## 2015-12-10 ENCOUNTER — Ambulatory Visit (INDEPENDENT_AMBULATORY_CARE_PROVIDER_SITE_OTHER): Payer: Medicare Other

## 2015-12-10 ENCOUNTER — Telehealth: Payer: Self-pay

## 2015-12-10 VITALS — BP 110/72 | HR 66 | Temp 98.5°F | Ht 67.5 in | Wt 165.5 lb

## 2015-12-10 DIAGNOSIS — IMO0001 Reserved for inherently not codable concepts without codable children: Secondary | ICD-10-CM

## 2015-12-10 DIAGNOSIS — N400 Enlarged prostate without lower urinary tract symptoms: Secondary | ICD-10-CM

## 2015-12-10 DIAGNOSIS — R946 Abnormal results of thyroid function studies: Secondary | ICD-10-CM | POA: Diagnosis not present

## 2015-12-10 DIAGNOSIS — Z0000 Encounter for general adult medical examination without abnormal findings: Secondary | ICD-10-CM

## 2015-12-10 DIAGNOSIS — D638 Anemia in other chronic diseases classified elsewhere: Secondary | ICD-10-CM

## 2015-12-10 DIAGNOSIS — E118 Type 2 diabetes mellitus with unspecified complications: Secondary | ICD-10-CM

## 2015-12-10 DIAGNOSIS — N183 Chronic kidney disease, stage 3 unspecified: Secondary | ICD-10-CM

## 2015-12-10 DIAGNOSIS — E785 Hyperlipidemia, unspecified: Secondary | ICD-10-CM

## 2015-12-10 LAB — COMPREHENSIVE METABOLIC PANEL
ALBUMIN: 3.4 g/dL — AB (ref 3.5–5.2)
ALK PHOS: 72 U/L (ref 39–117)
ALT: 18 U/L (ref 0–53)
AST: 17 U/L (ref 0–37)
BUN: 26 mg/dL — AB (ref 6–23)
CALCIUM: 9 mg/dL (ref 8.4–10.5)
CHLORIDE: 102 meq/L (ref 96–112)
CO2: 26 mEq/L (ref 19–32)
CREATININE: 1.68 mg/dL — AB (ref 0.40–1.50)
GFR: 42.39 mL/min — ABNORMAL LOW (ref >60.00)
Glucose, Bld: 215 mg/dL — ABNORMAL HIGH (ref 70–99)
POTASSIUM: 4.5 meq/L (ref 3.5–5.1)
SODIUM: 135 meq/L (ref 135–145)
Total Bilirubin: 0.5 mg/dL (ref 0.2–1.2)
Total Protein: 6.4 g/dL (ref 6.0–8.3)

## 2015-12-10 LAB — CBC WITH DIFFERENTIAL/PLATELET
BASOS ABS: 0 10*3/uL (ref 0.0–0.1)
Basophils Relative: 0.3 % (ref 0.0–3.0)
EOS PCT: 1.2 % (ref 0.0–5.0)
Eosinophils Absolute: 0.1 10*3/uL (ref 0.0–0.7)
HEMATOCRIT: 39.2 % (ref 39.0–52.0)
Hemoglobin: 12.8 g/dL — ABNORMAL LOW (ref 13.0–17.0)
LYMPHS ABS: 1.6 10*3/uL (ref 0.7–4.0)
LYMPHS PCT: 16 % (ref 12.0–46.0)
MCHC: 32.7 g/dL (ref 30.0–36.0)
MCV: 90.2 fl (ref 78.0–100.0)
MONOS PCT: 8.2 % (ref 3.0–12.0)
Monocytes Absolute: 0.8 10*3/uL (ref 0.1–1.0)
Neutro Abs: 7.3 10*3/uL (ref 1.4–7.7)
Neutrophils Relative %: 74.3 % (ref 43.0–77.0)
Platelets: 243 10*3/uL (ref 150.0–400.0)
RBC: 4.35 Mil/uL (ref 4.22–5.81)
RDW: 19 % — ABNORMAL HIGH (ref 11.5–15.5)
WBC: 9.8 10*3/uL (ref 4.0–10.5)

## 2015-12-10 LAB — LIPID PANEL
CHOLESTEROL: 170 mg/dL (ref 0–200)
HDL: 48 mg/dL (ref >39.00)
LDL CALC: 90 mg/dL (ref 0–99)
NonHDL: 121.7
TRIGLYCERIDES: 161 mg/dL — AB (ref 0.0–149.0)
Total CHOL/HDL Ratio: 4
VLDL: 32.2 mg/dL (ref 0.0–40.0)

## 2015-12-10 LAB — PSA: PSA: 0.38 ng/mL (ref 0.10–4.00)

## 2015-12-10 LAB — TSH: TSH: 1.37 u[IU]/mL (ref 0.35–4.50)

## 2015-12-10 LAB — MICROALBUMIN / CREATININE URINE RATIO
Creatinine,U: 119.8 mg/dL
MICROALB/CREAT RATIO: 17.5 mg/g (ref 0.0–30.0)
Microalb, Ur: 21 mg/dL — ABNORMAL HIGH (ref 0.0–1.9)

## 2015-12-10 NOTE — Telephone Encounter (Signed)
Lisa is aware

## 2015-12-10 NOTE — Telephone Encounter (Signed)
Yes add COPD to list Wash with warm water and soap. Apply bactroban and cover with Tefla and guaze. Change daily.

## 2015-12-10 NOTE — Patient Instructions (Signed)
Mr. Debois , Thank you for taking time to come for your Medicare Wellness Visit. I appreciate your ongoing commitment to your health goals. Please review the following plan we discussed and let me know if I can assist you in the future.   These are the goals we discussed: Goals    . Increase physical activity     Starting 12/10/2015, I will continue to exercise for at least 30 min daily.        This is a list of the screening recommended for you and due dates:  Health Maintenance  Topic Date Due  . Complete foot exam   01/10/2016*  . Shingles Vaccine  07/05/2021*  . Flu Shot  01/11/2016  . Eye exam for diabetics  01/11/2016  . Hemoglobin A1C  04/26/2016  . Urine Protein Check  12/09/2016  . Colon Cancer Screening  08/20/2018  . Tetanus Vaccine  05/16/2022  . Pneumonia vaccines  Completed  *Topic was postponed. The date shown is not the original due date.    Preventive Care for Adults  A healthy lifestyle and preventive care can promote health and wellness. Preventive health guidelines for adults include the following key practices.  . A routine yearly physical is a good way to check with your health care provider about your health and preventive screening. It is a chance to share any concerns and updates on your health and to receive a thorough exam.  . Visit your dentist for a routine exam and preventive care every 6 months. Brush your teeth twice a day and floss once a day. Good oral hygiene prevents tooth decay and gum disease.  . The frequency of eye exams is based on your age, health, family medical history, use  of contact lenses, and other factors. Follow your health care provider's ecommendations for frequency of eye exams.  . Eat a healthy diet. Foods like vegetables, fruits, whole grains, low-fat dairy products, and lean protein foods contain the nutrients you need without too many calories. Decrease your intake of foods high in solid fats, added sugars, and salt. Eat the  right amount of calories for you. Get information about a proper diet from your health care provider, if necessary.  . Regular physical exercise is one of the most important things you can do for your health. Most adults should get at least 150 minutes of moderate-intensity exercise (any activity that increases your heart rate and causes you to sweat) each week. In addition, most adults need muscle-strengthening exercises on 2 or more days a week.  Silver Sneakers may be a benefit available to you. To determine eligibility, you may visit the website: www.silversneakers.com or contact program at [phone] Mon-Fri between 8AM-8PM.   . Maintain a healthy weight. The body mass index (BMI) is a screening tool to identify possible weight problems. It provides an estimate of body fat based on height and weight. Your health care provider can find your BMI and can help you achieve or maintain a healthy weight.   For adults 20 years and older: ? A BMI below 18.5 is considered underweight. ? A BMI of 18.5 to 24.9 is normal. ? A BMI of 25 to 29.9 is considered overweight. ? A BMI of 30 and above is considered obese.   . Maintain normal blood lipids and cholesterol levels by exercising and minimizing your intake of saturated fat. Eat a balanced diet with plenty of fruit and vegetables. Blood tests for lipids and cholesterol should begin at age 75  and be repeated every 5 years. If your lipid or cholesterol levels are high, you are over 50, or you are at high risk for heart disease, you may need your cholesterol levels checked more frequently. Ongoing high lipid and cholesterol levels should be treated with medicines if diet and exercise are not working.  . If you smoke, find out from your health care provider how to quit. If you do not use tobacco, please do not start.  . If you choose to drink alcohol, please do not consume more than 2 drinks per day. One drink is considered to be 12 ounces (355 mL) of  beer, 5 ounces (148 mL) of wine, or 1.5 ounces (44 mL) of liquor.  . If you are 22-35 years old, ask your health care provider if you should take aspirin to prevent strokes.  . Use sunscreen. Apply sunscreen liberally and repeatedly throughout the day. You should seek shade when your shadow is shorter than you. Protect yourself by wearing long sleeves, pants, a wide-brimmed hat, and sunglasses year round, whenever you are outdoors.  . Once a month, do a whole body skin exam, using a mirror to look at the skin on your back. Tell your health care provider of new moles, moles that have irregular borders, moles that are larger than a pencil eraser, or moles that have changed in shape or color.

## 2015-12-10 NOTE — Progress Notes (Signed)
Pre visit review using our clinic review tool, if applicable. No additional management support is needed unless otherwise documented below in the visit note. 

## 2015-12-10 NOTE — Progress Notes (Signed)
I reviewed health advisor's note, was available for consultation, and agree with documentation and plan.  

## 2015-12-10 NOTE — Progress Notes (Signed)
Subjective:   Curtis Frazier is a 75 y.o. male who presents for Medicare Annual/Subsequent preventive examination.  Review of Systems:  N/A Cardiac Risk Factors include: advanced age (>26men, >3 women);diabetes mellitus;male gender;dyslipidemia     Objective:    Vitals: BP 110/72 mmHg  Pulse 66  Temp(Src) 98.5 F (36.9 C) (Oral)  Ht 5' 7.5" (1.715 m)  Wt 165 lb 8 oz (75.07 kg)  BMI 25.52 kg/m2  SpO2 94%  Body mass index is 25.52 kg/(m^2).  Tobacco History  Smoking status  . Former Smoker -- 1.50 packs/day for 53 years  . Types: Cigarettes  . Quit date: 11/22/2008  Smokeless tobacco  . Never Used    Comment: quit smoking cigarettes in 2010     Counseling given: No   Past Medical History  Diagnosis Date  . CAD (coronary artery disease)     a. 2010 s/p MI/Cath/PCI ([facility]): LM 10, LAD 30p, 54m, D1 20, D2 30, LCX 30p, 70m (3.5 x 15 Driver BMS), [id number], RCA 70p, 58m; b. 04/2011 Neg MV;  c. 04/2015 Myoview: very mild apical thinning, no ischemia, EF 65%.  . Hyperlipidemia     a. takes pravachol.  . Personal history of colonic polyps   . Actinic keratosis   . Psoriatic arthropathy ([place])   . Other psoriasis   . Scoliosis (and kyphoscoliosis), idiopathic   . GI bleed     a. 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  . Gastritis 12/10  . Hypertensive heart disease     a. takes Metoprolol daily  . Dyspnea on exertion   . Headache(784.0)     occasionally  . Joint pain   . Chronic back pain     scoliosis/stenosis/radiculopathy,degenerative disc disease  . Psoriasis   . Bruises easily   . Esophageal reflux     takes Omeprazole daily  . History of colonic polyps   . Hemorrhoids   . History of nephrolithiasis   . History of blood transfusion 2010  . PMR (polymyalgia rheumatica) ([facility]) 01/20/2012    tx by specialist on a long prednisone taper  . Rheumatoid arthritis(714.0)     takes Methotrexate 7pills weekly  . Degeneration of intervertebral disc, site unspecified   . History  of pneumonia 2008; 07/2015  . Type II or unspecified type diabetes mellitus without mention of complication, not stated as uncontrolled   . Skin cancer     "arms"  . Chronic diastolic CHF (congestive heart failure) ([place])     a. 08/2015 Echo: EF 55-60%, mild LVH, no rwma, Gr1 DD, mod AS, mildly dil LA, PASP 76mmHg.  . Moderate aortic stenosis     a. 08/2015 Mod AS, valve area (VTI) - 1.5cm^2, (Vmax) - 1.25cm^2; b. 10/2015 TEE EF 55-60%, mod AS.  Marland Kitchen Paroxysmal atrial fibrillation ([place])     a. 06/2011 Post-op AF-->converted on amio;  b. 07/2015 TEE/unsuccessful DCCV x 2; c. 09/2015 recurrent AF/Flutter-->successful DCCV; d. 09/2015 Recurrent AF-->10/2015 s/p TEE (EF 55-60%, mod AS, mild MR, sev dil LA w/o LAA thrombus, sev dil RA, mild to mod TR) & DCCV (120 J x 1).  . Lumbar stenosis     a. s/p lumbar diskectomy 2017.  . Carotid arterial disease ([place])     a. 10/2015 Carotid US: bilat 1-39% heterogeneous plaque/stenosis. Nl subclavian arteries bilat.  . CKD (chronic kidney disease), stage III     a. 09/2015 AKI in setting of dehydration - seen @ [facility].   Past Surgical History  Procedure Laterality  Date  . Rotator cuff repair Left 2000  . Colonoscopy    . Vasectomy  1979  . Coronary angioplasty with stent placement  02/2009    has one stent  . Carotid doppler  10/12    0-39% R and 60-79% left   . Cataract extraction  2012  . Cardiac catheterization  2010  . Lithotripsy  2009  . Tee without cardioversion N/A 08/06/2015    Procedure: TRANSESOPHAGEAL ECHOCARDIOGRAM (TEE);  Surgeon: Skeet Latch, MD;  Location: [facility];  Service: Cardiovascular;  Laterality: N/A;  . Cardioversion N/A 08/06/2015    Procedure: CARDIOVERSION;  Surgeon: Skeet Latch, MD;  Location: [facility] ENDOSCOPY;  Service: Cardiovascular;  Laterality: N/A;  . Back surgery    . Lumbar laminectomy  07/2015  . Posterior laminectomy thoracic spine      "fixed the discs"  . Skin cancer excision      "arms"  . Tee without  cardioversion N/A 09/14/2015    Procedure: TRANSESOPHAGEAL ECHOCARDIOGRAM (TEE);  Surgeon: Jerline Pain, MD;  Location: [facility];  Service: Cardiovascular;  Laterality: N/A;  . Cardioversion N/A 09/14/2015    Procedure: CARDIOVERSION;  Surgeon: Jerline Pain, MD;  Location: [facility];  Service: Cardiovascular;  Laterality: N/A;  . Tee without cardioversion N/A 11/04/2015    Procedure: TRANSESOPHAGEAL ECHOCARDIOGRAM (TEE);  Surgeon: Sanda Klein, MD;  Location: [facility];  Service: Cardiovascular;  Laterality: N/A;  . Cardioversion N/A 11/04/2015    Procedure: CARDIOVERSION;  Surgeon: Sanda Klein, MD;  Location: [facility] ENDOSCOPY;  Service: Cardiovascular;  Laterality: N/A;   Family History  Problem Relation Age of Onset  . Coronary artery disease Mother   . Diabetes Mother   . CAD Mother   . Coronary artery disease Sister   . Hypertension Sister   . Kidney failure Sister   . CAD Sister   . Diabetes Brother   . Prostate cancer Brother   . Pancreatic cancer Brother   . Diabetes Brother   . Anesthesia problems Neg Hx   . Hypotension Neg Hx   . Malignant hyperthermia Neg Hx   . Pseudochol deficiency Neg Hx    History  Sexual Activity  . Sexual Activity: No    Outpatient Encounter Prescriptions as of 12/10/2015  Medication Sig  . acetaminophen (TYLENOL) 325 MG tablet Take 650 mg by mouth every 6 (six) hours as needed (Pain scale of 1-4 or temp > 101).  Marland Kitchen albuterol (PROVENTIL HFA;VENTOLIN HFA) 108 (90 Base) MCG/ACT inhaler Inhale 2 puffs into the lungs every 6 (six) hours as needed for wheezing or shortness of breath.  Marland Kitchen amiodarone (PACERONE) 200 MG tablet Take 1 tablet (200 mg total) by mouth 2 (two) times daily.  . calcium-vitamin D (OSCAL WITH D) 500-200 MG-UNIT tablet Take 1 tablet by mouth daily with breakfast.  . diltiazem (CARDIZEM CD) 240 MG 24 hr capsule TAKE 1 CAPSULE BY MOUTH DAILY  . docusate sodium (COLACE) 100 MG capsule Take 100 mg by mouth 2 (two) times daily.  Reported on 09/22/2015  . ferrous sulfate 325 (65 FE) MG tablet Take 1 tablet (325 mg total) by mouth 3 (three) times daily with meals.  Marland Kitchen glipiZIDE (GLUCOTROL) 10 MG tablet Take 1 tablet (10 mg total) by mouth 2 (two) times daily before a meal.  . glucose blood (ONE TOUCH ULTRA TEST) test strip Check blood sugar three times daily and as directed. Dx E11.8 diabetes that is poorly controlled  . pantoprazole (PROTONIX) 40 MG tablet Take 1 tablet (40  mg total) by mouth daily.  . pravastatin (PRAVACHOL) 20 MG tablet Take 1 tablet (20 mg total) by mouth every evening.  . predniSONE (DELTASONE) 5 MG tablet Take 1 tablet (5 mg total) by mouth daily with breakfast.  . thiamine 100 MG tablet Take 1 tablet (100 mg total) by mouth daily.  Marland Kitchen tiotropium (SPIRIVA) 18 MCG inhalation capsule Place 1 capsule (18 mcg total) into inhaler and inhale daily.  Marland Kitchen warfarin (COUMADIN) 2.5 MG tablet Take as directed by anti-coagulation clinic.   No facility-administered encounter medications on file as of 12/10/2015.    Activities of Daily Living In your present state of health, do you have any difficulty performing the following activities: 12/10/2015 10/13/2015  Hearing? Y N  Vision? N N  Difficulty concentrating or making decisions? N N  Walking or climbing stairs? N Y  Dressing or bathing? N N  Doing errands, shopping? N Y  Conservation officer, nature and eating ? N N  Using the Toilet? N N  In the past six months, have you accidently leaked urine? N Y  Do you have problems with loss of bowel control? N N  Managing your Medications? N Y  Managing your Finances? N Y  Housekeeping or managing your Housekeeping? N N    Patient Care Team: Abner Greenspan, MD as PCP - General Heath Lark, MD as Consulting Physician (Hematology and Oncology) Alfonzo Feller, RN as [place] Management Calvert Cantor, MD as Consulting Physician (Ophthalmology)   Assessment:     Hearing Screening   125Hz  250Hz  500Hz  1000Hz   2000Hz  4000Hz  8000Hz   Right ear:   0 0 40 0   Left ear:   0 0 0 0   Vision Screening Comments: Last vision exam in Aug 2016 with Dr. Bing Plume   Exercise Activities and Dietary recommendations Current Exercise Habits: Home exercise routine, Type of exercise: walking;Other - see comments (stationary bike), Time (Minutes): 30, Frequency (Times/Week): 7, Weekly Exercise (Minutes/Week): 210, Intensity: Mild, Exercise limited by: orthopedic condition(s)  Goals    . Increase physical activity     Starting 12/10/2015, I will continue to exercise for at least 30 min daily.       Fall Risk Fall Risk  12/10/2015 10/22/2015 10/21/2015 11/23/2014 07/07/2013  Falls in the past year? Yes - No Yes No  Number falls in past yr: 2 or more - - 1 -  Injury with Fall? Yes - - No -  Risk for fall due to : - History of fall(s) - - -  Risk for fall due to (comments): - Fall prevention discussed - - -  Follow up Falls evaluation completed;Falls prevention discussed - - - -   Depression Screen PHQ 2/9 Scores 12/10/2015 10/04/2015 06/28/2015 11/23/2014  PHQ - 2 Score 0 0 0 0    Cognitive Testing MMSE - Mini Mental State Exam 12/10/2015  Orientation to time 5  Orientation to Place 5  Registration 3  Attention/ Calculation 0  Recall 3  Language- name 2 objects 0  Language- repeat 1  Language- follow 3 step command 2  Language- follow 3 step command-comments pt was unable to follow 1 step of 3 step command despite multiple cues  Language- read & follow direction 0  Write a sentence 0  Copy design 0  Total score 19   PLEASE NOTE: A Mini-Cog screen was completed. Maximum score is 20. A value of 0 denotes this part of [place] MMSE was not completed or the patient  failed this part of the Mini-Cog screening.   Mini-Cog Screening Orientation to Time - Max 5 pts Orientation to Place - Max 5 pts Registration - Max 3 pts Recall - Max 3 pts Language Repeat - Max 1 pts Language Follow 3 Step Command - Max 3  pts  Immunization History  Administered Date(s) Administered  . Influenza Whole 05/30/2006, 04/24/2007, 03/18/2008, 03/09/2009, 04/11/2010  . Influenza, High Dose Seasonal PF 04/11/2015  . Influenza-Unspecified 03/13/2014  . Pneumococcal Conjugate-13 06/26/2014  . Pneumococcal Polysaccharide-23 01/17/2007  . Td 08/03/2004  . Tdap 05/16/2012   Screening Tests Health Maintenance  Topic Date Due  . FOOT EXAM  01/10/2016 (Originally 11/23/2015)  . ZOSTAVAX  07/05/2021 (Originally 07/22/1999)  . INFLUENZA VACCINE  01/11/2016  . OPHTHALMOLOGY EXAM  01/11/2016  . HEMOGLOBIN A1C  04/26/2016  . URINE MICROALBUMIN  12/09/2016  . COLONOSCOPY  08/20/2018  . TETANUS/TDAP  05/16/2022  . PNA vac Low Risk Adult  Completed      Plan:     I have personally reviewed and addressed the Medicare Annual Wellness questionnaire and have noted the following in the patient's chart:  A. Medical and social history B. Use of alcohol, tobacco or illicit drugs  C. Current medications and supplements D. Functional ability and status E.  Nutritional status F.  Physical activity G. Advance directives H. List of other physicians I.  Hospitalizations, surgeries, and ER visits in previous 12 months J.  [place] to include hearing, vision, cognitive, depression L. Referrals and appointments - none  In addition, I have reviewed and discussed with patient certain preventive protocols, quality metrics, and best practice recommendations. A written personalized care plan for preventive services as well as general preventive health recommendations were provided to patient.  See attached scanned questionnaire for additional information.   Signed,   Lindell Noe, MHA, BS, LPN Health Advisor

## 2015-12-10 NOTE — Telephone Encounter (Signed)
[name] at [facility] left v/m; pt fell 12/09/15 with skin tear to lt elbow and request basic wound care instructions. Also pt was prescribed spiriva on 12/07/15 and [name] wants to know if COPD should be added to pts dx and home health do teaching. Lisa request cb.

## 2015-12-10 NOTE — Progress Notes (Signed)
PCP notes:  Health maintenance  Microalbumin - completed Foot exam - will be completed at CPE  Abnormal screenings:  Hearing - failed Fall risk: hx of multiple falls with injury Cognitive: Mini-Cog score - 19/20  Patient concerns: None  Nurse concerns: None  Next PCP appt: 01/10/16 @ 1515

## 2015-12-15 ENCOUNTER — Ambulatory Visit (HOSPITAL_COMMUNITY)
Admission: RE | Admit: 2015-12-15 | Discharge: 2015-12-16 | Disposition: A | Discharge status: Home / Self Care | Payer: Medicare Other | Source: Ambulatory Visit | Attending: Cardiology | Admitting: Cardiology

## 2015-12-15 ENCOUNTER — Encounter (HOSPITAL_COMMUNITY): Payer: Self-pay | Admitting: Cardiology

## 2015-12-15 ENCOUNTER — Encounter (HOSPITAL_COMMUNITY)
Admission: RE | Disposition: A | Discharge status: Home / Self Care | Payer: Self-pay | Source: Ambulatory Visit | Attending: Cardiology

## 2015-12-15 DIAGNOSIS — I493 Ventricular premature depolarization: Secondary | ICD-10-CM | POA: Diagnosis not present

## 2015-12-15 DIAGNOSIS — I5032 Chronic diastolic (congestive) heart failure: Secondary | ICD-10-CM | POA: Diagnosis not present

## 2015-12-15 DIAGNOSIS — I4892 Unspecified atrial flutter: Secondary | ICD-10-CM | POA: Diagnosis not present

## 2015-12-15 DIAGNOSIS — I471 Supraventricular tachycardia: Secondary | ICD-10-CM | POA: Diagnosis not present

## 2015-12-15 DIAGNOSIS — Z880 Allergy status to penicillin: Secondary | ICD-10-CM | POA: Insufficient documentation

## 2015-12-15 DIAGNOSIS — E119 Type 2 diabetes mellitus without complications: Secondary | ICD-10-CM | POA: Insufficient documentation

## 2015-12-15 DIAGNOSIS — I4891 Unspecified atrial fibrillation: Secondary | ICD-10-CM | POA: Insufficient documentation

## 2015-12-15 DIAGNOSIS — M069 Rheumatoid arthritis, unspecified: Secondary | ICD-10-CM | POA: Insufficient documentation

## 2015-12-15 DIAGNOSIS — I483 Typical atrial flutter: Secondary | ICD-10-CM | POA: Insufficient documentation

## 2015-12-15 DIAGNOSIS — M353 Polymyalgia rheumatica: Secondary | ICD-10-CM | POA: Diagnosis not present

## 2015-12-15 DIAGNOSIS — E785 Hyperlipidemia, unspecified: Secondary | ICD-10-CM | POA: Insufficient documentation

## 2015-12-15 DIAGNOSIS — Z951 Presence of aortocoronary bypass graft: Secondary | ICD-10-CM | POA: Insufficient documentation

## 2015-12-15 DIAGNOSIS — I2510 Atherosclerotic heart disease of native coronary artery without angina pectoris: Secondary | ICD-10-CM | POA: Insufficient documentation

## 2015-12-15 DIAGNOSIS — I110 Hypertensive heart disease with heart failure: Secondary | ICD-10-CM | POA: Diagnosis not present

## 2015-12-15 HISTORY — DX: Chronic obstructive pulmonary disease, unspecified: J44.9

## 2015-12-15 HISTORY — DX: Calculus of kidney: N20.0

## 2015-12-15 HISTORY — DX: Type 2 diabetes mellitus without complications: E11.9

## 2015-12-15 HISTORY — DX: ST elevation (STEMI) myocardial infarction of unspecified site: I21.3

## 2015-12-15 HISTORY — DX: Sleep apnea, unspecified: G47.30

## 2015-12-15 HISTORY — PX: ELECTROPHYSIOLOGIC STUDY: SHX172A

## 2015-12-15 HISTORY — DX: Unspecified osteoarthritis, unspecified site: M19.90

## 2015-12-15 LAB — PROTIME-INR
INR: 2.17 — ABNORMAL HIGH (ref 0.00–1.49)
Prothrombin Time: 24 seconds — ABNORMAL HIGH (ref 11.6–15.2)

## 2015-12-15 LAB — GLUCOSE, CAPILLARY
GLUCOSE-CAPILLARY: 290 mg/dL — AB (ref 65–99)
Glucose-Capillary: 117 mg/dL — ABNORMAL HIGH (ref 65–99)
Glucose-Capillary: 129 mg/dL — ABNORMAL HIGH (ref 65–99)
Glucose-Capillary: 346 mg/dL — ABNORMAL HIGH (ref 65–99)

## 2015-12-15 SURGERY — A-FLUTTER/A-TACH/SVT ABLATION

## 2015-12-15 MED ORDER — VITAMIN B-1 100 MG PO TABS
100.0000 mg | ORAL_TABLET | Freq: Every day | ORAL | Status: DC
  Administered 2015-12-15 – 2015-12-16 (×2): 100 mg via ORAL
  Filled 2015-12-15 (×2): qty 1

## 2015-12-15 MED ORDER — HEPARIN (PORCINE) IN NACL 2-0.9 UNIT/ML-% IJ SOLN
INTRAMUSCULAR | Status: DC | PRN
  Administered 2015-12-15: 08:00:00

## 2015-12-15 MED ORDER — WARFARIN SODIUM 5 MG PO TABS
5.0000 mg | ORAL_TABLET | ORAL | Status: DC
  Administered 2015-12-15: 5 mg via ORAL
  Filled 2015-12-15: qty 1

## 2015-12-15 MED ORDER — SODIUM CHLORIDE 0.9% FLUSH: 3.0000 mL | INTRAVENOUS | Status: DC | PRN

## 2015-12-15 MED ORDER — ONDANSETRON HCL 4 MG/2ML IJ SOLN: 4.0000 mg | Freq: Four times a day (QID) | INTRAMUSCULAR | Status: DC | PRN

## 2015-12-15 MED ORDER — BUPIVACAINE HCL (PF) 0.25 % IJ SOLN
INTRAMUSCULAR | Status: AC
  Filled 2015-12-15: qty 30

## 2015-12-15 MED ORDER — FENTANYL CITRATE (PF) 100 MCG/2ML IJ SOLN
INTRAMUSCULAR | Status: AC
  Filled 2015-12-15: qty 2

## 2015-12-15 MED ORDER — AMIODARONE HCL 200 MG PO TABS
200.0000 mg | ORAL_TABLET | Freq: Two times a day (BID) | ORAL | Status: DC
  Administered 2015-12-15 – 2015-12-16 (×3): 200 mg via ORAL
  Filled 2015-12-15 (×3): qty 1

## 2015-12-15 MED ORDER — BUPIVACAINE HCL (PF) 0.25 % IJ SOLN
INTRAMUSCULAR | Status: DC | PRN
  Administered 2015-12-15: 31 mL

## 2015-12-15 MED ORDER — SODIUM CHLORIDE 0.9% FLUSH
3.0000 mL | Freq: Two times a day (BID) | INTRAVENOUS | Status: DC
  Administered 2015-12-15: 3 mL via INTRAVENOUS

## 2015-12-15 MED ORDER — DOCUSATE SODIUM 100 MG PO CAPS
100.0000 mg | ORAL_CAPSULE | Freq: Two times a day (BID) | ORAL | Status: DC
  Administered 2015-12-15 – 2015-12-16 (×3): 100 mg via ORAL
  Filled 2015-12-15 (×3): qty 1

## 2015-12-15 MED ORDER — CALCIUM CARBONATE-VITAMIN D 500-200 MG-UNIT PO TABS
1.0000 | ORAL_TABLET | Freq: Every day | ORAL | Status: DC
  Administered 2015-12-15 – 2015-12-16 (×2): 1 via ORAL
  Filled 2015-12-15 (×2): qty 1

## 2015-12-15 MED ORDER — FENTANYL CITRATE (PF) 100 MCG/2ML IJ SOLN
INTRAMUSCULAR | Status: DC | PRN
  Administered 2015-12-15 (×3): 25 ug via INTRAVENOUS

## 2015-12-15 MED ORDER — DILTIAZEM HCL ER COATED BEADS 240 MG PO CP24
240.0000 mg | ORAL_CAPSULE | Freq: Every day | ORAL | Status: DC
  Administered 2015-12-15 – 2015-12-16 (×2): 240 mg via ORAL
  Filled 2015-12-15 (×2): qty 1

## 2015-12-15 MED ORDER — OFF THE BEAT BOOK
Freq: Once | Status: AC
  Administered 2015-12-15: 11:00:00
  Filled 2015-12-15: qty 1

## 2015-12-15 MED ORDER — WARFARIN - PHYSICIAN DOSING INPATIENT: Freq: Every day | Status: DC

## 2015-12-15 MED ORDER — PREDNISONE 5 MG PO TABS
5.0000 mg | ORAL_TABLET | Freq: Every day | ORAL | Status: DC
  Administered 2015-12-15 – 2015-12-16 (×2): 5 mg via ORAL
  Filled 2015-12-15 (×2): qty 1

## 2015-12-15 MED ORDER — LIVING WELL WITH DIABETES BOOK
Freq: Once | Status: AC
  Administered 2015-12-15: 11:00:00
  Filled 2015-12-15: qty 1

## 2015-12-15 MED ORDER — TIOTROPIUM BROMIDE MONOHYDRATE 18 MCG IN CAPS
18.0000 ug | ORAL_CAPSULE | Freq: Every day | RESPIRATORY_TRACT | Status: DC
  Administered 2015-12-15: 11:00:00 18 ug via RESPIRATORY_TRACT
  Filled 2015-12-15: qty 5

## 2015-12-15 MED ORDER — PRAVASTATIN SODIUM 20 MG PO TABS
20.0000 mg | ORAL_TABLET | Freq: Every evening | ORAL | Status: DC
  Administered 2015-12-15: 20 mg via ORAL
  Filled 2015-12-15: qty 1

## 2015-12-15 MED ORDER — WARFARIN SODIUM 2.5 MG PO TABS: 2.5000 mg | ORAL_TABLET | ORAL | Status: DC

## 2015-12-15 MED ORDER — ALBUTEROL SULFATE (2.5 MG/3ML) 0.083% IN NEBU: 3.0000 mL | INHALATION_SOLUTION | Freq: Four times a day (QID) | RESPIRATORY_TRACT | Status: DC | PRN

## 2015-12-15 MED ORDER — MIDAZOLAM HCL 5 MG/5ML IJ SOLN
INTRAMUSCULAR | Status: DC | PRN
  Administered 2015-12-15: 2 mg via INTRAVENOUS

## 2015-12-15 MED ORDER — GLIPIZIDE 10 MG PO TABS
10.0000 mg | ORAL_TABLET | Freq: Two times a day (BID) | ORAL | Status: DC
  Administered 2015-12-15 – 2015-12-16 (×2): 10 mg via ORAL
  Filled 2015-12-15 (×2): qty 1

## 2015-12-15 MED ORDER — FERROUS SULFATE 325 (65 FE) MG PO TABS
325.0000 mg | ORAL_TABLET | Freq: Three times a day (TID) | ORAL | Status: DC
  Administered 2015-12-15 – 2015-12-16 (×3): 325 mg via ORAL
  Filled 2015-12-15 (×3): qty 1

## 2015-12-15 MED ORDER — SODIUM CHLORIDE 0.9 % IV SOLN: 250.0000 mL | INTRAVENOUS | Status: DC | PRN

## 2015-12-15 MED ORDER — PANTOPRAZOLE SODIUM 40 MG PO TBEC
40.0000 mg | DELAYED_RELEASE_TABLET | Freq: Every day | ORAL | Status: DC
  Administered 2015-12-15 – 2015-12-16 (×2): 40 mg via ORAL
  Filled 2015-12-15 (×2): qty 1

## 2015-12-15 MED ORDER — ACETAMINOPHEN 325 MG PO TABS: 650.0000 mg | ORAL_TABLET | Freq: Four times a day (QID) | ORAL | Status: DC | PRN

## 2015-12-15 MED ORDER — MIDAZOLAM HCL 5 MG/5ML IJ SOLN
INTRAMUSCULAR | Status: AC
  Filled 2015-12-15: qty 5

## 2015-12-15 MED ORDER — ACETAMINOPHEN 325 MG PO TABS: 650.0000 mg | ORAL_TABLET | ORAL | Status: DC | PRN

## 2015-12-15 MED ORDER — HEPARIN (PORCINE) IN NACL 2-0.9 UNIT/ML-% IJ SOLN
INTRAMUSCULAR | Status: AC
  Filled 2015-12-15: qty 500

## 2015-12-15 MED ORDER — WARFARIN SODIUM 5 MG PO TABS: 2.5000 mg | ORAL_TABLET | Freq: Every day | ORAL | Status: DC

## 2015-12-15 SURGICAL SUPPLY — 14 items
BAG SNAP BAND KOVER 36X36 (MISCELLANEOUS) ×3 IMPLANT
CATH EZ STEER NAV 8MM D-F CUR (ABLATOR) ×3 IMPLANT
CATH JOSEPH QUAD ALLRED 6F REP (CATHETERS) ×6 IMPLANT
CATH WEBSTER BI DIR CS D-F CRV (CATHETERS) ×3 IMPLANT
PACK EP LATEX FREE (CUSTOM PROCEDURE TRAY) ×2
PACK EP LF (CUSTOM PROCEDURE TRAY) ×1 IMPLANT
PAD DEFIB LIFELINK (PAD) ×3 IMPLANT
PATCH CARTO3 (PAD) ×3 IMPLANT
SHEATH PINNACLE 6F 10CM (SHEATH) ×3 IMPLANT
SHEATH PINNACLE 7F 10CM (SHEATH) ×3 IMPLANT
SHEATH PINNACLE 8F 10CM (SHEATH) ×3 IMPLANT
SHEATH PINNACLE 9F 10CM (SHEATH) ×3 IMPLANT
SHEATH SWARTZ RAMP 8.5F 60CM (SHEATH) ×3 IMPLANT
SHIELD RADPAD SCOOP 12X17 (MISCELLANEOUS) ×3 IMPLANT

## 2015-12-15 NOTE — Progress Notes (Addendum)
Site area: rt groin Site Prior to Removal:  Level 0 Pressure Applied For:  15 minutes Manual:   yes Patient Status During Pull:  stable Post Pull Site:  Level  0 Post Pull Instructions Given:  yes Post Pull Pulses Present: yes Dressing Applied:  Small tegaderm Bedrest begins @ 1030 Comments: IV saline locked

## 2015-12-15 NOTE — H&P (Signed)
Curtis Frazier is a 75 y.o. male presenting today with atrial flutter.  On exam, regular rhythm, no murmurs, lungs clear.  Presenting to the hospital for ablation of his flutter.  Risks and benefits of ablation were explained.  Risks include bleeding, tamponade, heart block, and stroke.  He understands these risks and has agreed to the procedure.  He is currently taking warfarin and has been compliant with his medications.    Garrell Flagg Curt Bears, MD 12/15/2015 7:14 AM

## 2015-12-15 NOTE — Progress Notes (Signed)
Patient transferred to room 3W39 at this time via bed. Right groin site remains a level 0 with dressing in place C/D/I. Patient's oxygen saturation is 90% on room air, Liters of oxygen applied and oxygen SATs increased to 93%. Jessica RN at bedside prior to departure. No s/s of distress noted or complaints voiced. Dr Allene Pyo informed of transfer and also informed of patient's trigeminy readings. No further orders given, states patient would be discharged this evening.

## 2015-12-15 NOTE — Progress Notes (Signed)
Pt concerned about same day discharge. Will hold discharge today and plan to discharge in the morning.  Chanetta Marshall, NP 12/15/2015 2:32 PM

## 2015-12-15 NOTE — Discharge Instructions (Signed)

## 2015-12-16 DIAGNOSIS — I471 Supraventricular tachycardia: Secondary | ICD-10-CM | POA: Diagnosis not present

## 2015-12-16 DIAGNOSIS — I4891 Unspecified atrial fibrillation: Secondary | ICD-10-CM | POA: Diagnosis not present

## 2015-12-16 DIAGNOSIS — I4892 Unspecified atrial flutter: Secondary | ICD-10-CM | POA: Diagnosis not present

## 2015-12-16 DIAGNOSIS — I493 Ventricular premature depolarization: Secondary | ICD-10-CM | POA: Diagnosis not present

## 2015-12-16 LAB — PROTIME-INR
INR: 2.4 — AB (ref 0.00–1.49)
PROTHROMBIN TIME: 25.9 s — AB (ref 11.6–15.2)

## 2015-12-16 LAB — GLUCOSE, CAPILLARY: Glucose-Capillary: 105 mg/dL — ABNORMAL HIGH (ref 65–99)

## 2015-12-16 NOTE — Discharge Summary (Signed)
ELECTROPHYSIOLOGY PROCEDURE DISCHARGE SUMMARY    Patient ID: Curtis Frazier,  MRN: [id number], DOB/AGE: July 07, 1940 75 y.o.  Admit date: 12/15/2015 Discharge date: 12/16/2015  Primary Care Physician: Loura Pardon, MD Primary Cardiologist: Johnsie Cancel Electrophysiologist: [place]  Primary Discharge Diagnosis:  Atrial flutter status post ablation this admission  Secondary Discharge Diagnosis:  1.  PVC's 2.  Remote atrial fibrillation 3.  CAD s/p CABG 4.  Hyperlipidemia 5.  Hypertension 6.  PMR 7.  Rheumatoid arthritis 8.  Chronic diastolic heart failure 9.  Diabetes   Allergies  Allergen Reactions  . Ace Inhibitors Other (See Comments)    Causes high K   . Angiotensin Receptor Blockers Other (See Comments)    Causes high K   . Metoprolol Other (See Comments)    May cause elevated K level   . Penicillins Swelling and Rash    Has patient had a PCN reaction causing immediate rash, facial/tongue/throat swelling, SOB or lightheadedness with hypotension: YES Has patient had a PCN reaction causing severe rash involving mucus membranes or skin necrosis: NO Has patient had a PCN reaction that required hospitalization NO Has patient had a PCN reaction occurring within the last 10 years: NO If all of the above answers are "NO", then may proceed with Cephalosporin use. Has patient had a PCN reaction causing immediate rash, facial  . Tetracycline Swelling and Rash     Procedures This Admission: 1.  Electrophysiology study and radiofrequency catheter ablation on 12/15/2015 by Dr Curt Bears.  This study demonstrated typical atrial flutter with successful CTI ablation.  There were no inducible arrhythmias following ablation and no early apparent complications.   Brief HPI: Curtis Frazier is a 75 y.o. male with a past medical history as outlined above.  He has documented atrial flutter. He has been appropriately anticoagulated for >4 weeks.  Risks, benefits, and alternatives to ablation were  reviewed with the patient who wished to proceed.   Hospital Course:  The patient was admitted and underwent EPS/RFCA with details as outlined above. He was monitored on telemetry overnight which demonstrated sinus rhythm with trigeminal PVC's.  Groin incision was without complication.  They were examined by Dr Curt Bears who considered them stable for discharge to home.  Follow up Rigo Letts be arranged in 4 weeks.  Wound care and restrictions were reviewed with the patient prior to discharge.   Physical Exam: Filed Vitals:   12/15/15 1230 12/15/15 1300 12/15/15 2012 12/16/15 0507  BP: 121/69 133/61 149/81 118/73  Pulse: 81 86 94 100  Temp:   98.2 F (36.8 C) 98.7 F (37.1 C)  TempSrc:   Oral Oral  Resp: 25 19 19 20   Height:      Weight:    155 lb 9.6 oz (70.58 kg)  SpO2: 92% 87% 94% 95%    GEN- The patient is well appearing, alert and oriented x 3 today.   HEENT: normocephalic, atraumatic; sclera clear, conjunctiva pink; hearing intact; oropharynx clear; neck supple  Lungs- Clear to ausculation bilaterally, normal work of breathing.  No wheezes, rales, rhonchi Heart- Regular rate and rhythm, no murmurs, rubs or gallops  GI- soft, non-tender, non-distended, bowel sounds present  Extremities- no clubbing, cyanosis, or edema; DP/PT/radial pulses 2+ bilaterally, groin without hematoma/bruit MS- no significant deformity or atrophy Skin- warm and dry, no rash or lesion Psych- euthymic mood, full affect Neuro- strength and sensation are intact   Labs:   Lab Results  Component Value Date   WBC 9.8 12/10/2015  HGB 12.8* 12/10/2015   HCT 39.2 12/10/2015   MCV 90.2 12/10/2015   PLT 243.0 12/10/2015     Recent Labs Lab 12/10/15 1111  NA 135  K 4.5  CL 102  CO2 26  BUN 26*  CREATININE 1.68*  CALCIUM 9.0  PROT 6.4  BILITOT 0.5  ALKPHOS 72  ALT 18  AST 17  GLUCOSE 215*    Discharge Medications:    Medication List    TAKE these medications        acetaminophen 325 MG  tablet  Commonly known as:  TYLENOL  Take 650 mg by mouth every 6 (six) hours as needed (Pain scale of 1-4 or temp > 101).     albuterol 108 (90 Base) MCG/ACT inhaler  Commonly known as:  PROVENTIL HFA;VENTOLIN HFA  Inhale 2 puffs into the lungs every 6 (six) hours as needed for wheezing or shortness of breath.     amiodarone 200 MG tablet  Commonly known as:  PACERONE  Take 1 tablet (200 mg total) by mouth 2 (two) times daily.     calcium-vitamin D 500-200 MG-UNIT tablet  Commonly known as:  OSCAL WITH D  Take 1 tablet by mouth daily with breakfast.     diltiazem 240 MG 24 hr capsule  Commonly known as:  CARDIZEM CD  TAKE 1 CAPSULE BY MOUTH DAILY     docusate sodium 100 MG capsule  Commonly known as:  COLACE  Take 100 mg by mouth 2 (two) times daily. Reported on 09/22/2015     ferrous sulfate 325 (65 FE) MG tablet  Take 1 tablet (325 mg total) by mouth 3 (three) times daily with meals.     glipiZIDE 10 MG tablet  Commonly known as:  GLUCOTROL  Take 1 tablet (10 mg total) by mouth 2 (two) times daily before a meal.     glucose blood test strip  Commonly known as:  ONE TOUCH ULTRA TEST  Check blood sugar three times daily and as directed. Dx E11.8 diabetes that is poorly controlled     pantoprazole 40 MG tablet  Commonly known as:  PROTONIX  Take 1 tablet (40 mg total) by mouth daily.     pravastatin 20 MG tablet  Commonly known as:  PRAVACHOL  Take 1 tablet (20 mg total) by mouth every evening.     predniSONE 5 MG tablet  Commonly known as:  DELTASONE  Take 1 tablet (5 mg total) by mouth daily with breakfast.     thiamine 100 MG tablet  Take 1 tablet (100 mg total) by mouth daily.     tiotropium 18 MCG inhalation capsule  Commonly known as:  SPIRIVA  Place 1 capsule (18 mcg total) into inhaler and inhale daily.     warfarin 2.5 MG tablet  Commonly known as:  COUMADIN  Take as directed by anti-coagulation clinic.        Disposition:  Discharge Instructions     Diet - low sodium heart healthy    Complete by:  As directed      Discharge instructions    Complete by:  As directed   No driving for 3 days. No lifting over 5 lbs for 1 week. No sexual activity for 1 week. Keep procedure site clean & dry. If you notice increased pain, swelling, bleeding or pus, call/return!  You may shower, but no soaking baths/hot tubs/pools for 1 week.     Increase activity slowly    Complete by:  As  directed           Follow-up Information    Follow up with Brahm Barbeau Meredith Leeds, MD On 01/12/2016.   Specialty:  Cardiology   Why:  at 11:15AM    Contact information:   [place] [id number] [phone]       Duration of Discharge Encounter: Greater than 30 minutes including physician time.  Signed, Chanetta Marshall, NP 12/16/2015 8:02 AM      I have seen and examined this patient with Chanetta Marshall.  Agree with above, note added to reflect my findings.  On exam, irregular rhythm, no murmurs, lungs clear.  In sinus today with PVCs.  Had atrial flutter ablation yesterday and tolerated well.  Genevieve Ritzel plan for discharge today with follow up in clinic and possibly 48 hour monitor to see his PVC burden.      Mylo Driskill M. Cheryll Keisler MD 12/16/2015 11:42 AM

## 2015-12-17 ENCOUNTER — Other Ambulatory Visit: Payer: Self-pay | Admitting: *Deleted

## 2015-12-17 NOTE — Patient Outreach (Signed)
[facility]) Care Management  12/17/2015  KRISTOFOR HUYETT 11/25/1940 [id number]   Transition of care Patient admitted 7/5 after atrial flutter ablation.  RN placed call to patient as part of the transition of care program, explained to patient , HIPAA verified. Mr.Preast reports that he is feeling pretty good, states he has more breath .  Mrs.Klose states that his weight is 158.4 on today and denies increase in weights or swelling noted. Patient states his blood sugar this morning was 98, and has been running better numbers.  Patient reports taking medication as directed and his daughter fills his pill box per MD instructions.  Mr.Zimmermann reports that he plans to call the rhythm clinic to make sure of his next scheduled appointment listed  8/2 on his discharge instructions and he has PCP appointment on 7/31.  Mr.Mirarchi denies any new concerns. Reviewed activity and instruction of when to call MD for concerns post procedure .  Plan Will continue weekly outreaches as part of the transition of care program, home visit scheduled in the next week...  [facility] CM Care Plan Problem One        Most Recent Value   Care Plan Problem One  Recent hospitalization for atrial flutter procedure   Role Documenting the Problem One  Care Management [place] for Problem One  Active   [facility] Long Term Goal (31-90 days)  Patient will not readmit to hospital in the next 31 days   [facility] Long Term Goal Start Date  12/17/15   Interventions for Problem One Long Term Goal  Reviewed transiiton of care weekly outreaches, importance of taking medications as prescribed and following with PCP as instructed.and if develops new symptoms   [facility] CM Short Term Goal #1 (0-30 days)  Patient will follow up with MD office appointment at scheduled time in the next 14 days   [facility] CM Short Term Goal #1 Start Date  12/17/15   Interventions for Short Term Goal #1  Patient to call and verify next appointment with  cardiology    [facility] CM Short Term Goal #2 (0-30 days)  Patient will be able to state knowledge of and adhere to discharge intructions on activity limits and when to call MD in the next 7 days   [facility] CM Short Term Goal #2 Start Date  12/17/15   Interventions for Short Term Goal #2  Reviewed with patient discharge instructions on activitiy weight restrictions and when to notify MD      [name], RN, [place] Management [phone]- Mobile [phone]- [place]

## 2015-12-21 ENCOUNTER — Other Ambulatory Visit: Payer: Self-pay | Admitting: Family Medicine

## 2015-12-21 NOTE — Telephone Encounter (Signed)
Please refill times 3 

## 2015-12-21 NOTE — Telephone Encounter (Signed)
done

## 2015-12-21 NOTE — Telephone Encounter (Signed)
Lat filled on 10/04/15 #90 tabs with 0 refills, please advsie

## 2015-12-22 ENCOUNTER — Ambulatory Visit (INDEPENDENT_AMBULATORY_CARE_PROVIDER_SITE_OTHER): Payer: Medicare Other | Admitting: Family Medicine

## 2015-12-22 ENCOUNTER — Emergency Department (HOSPITAL_COMMUNITY): Payer: Medicare Other

## 2015-12-22 ENCOUNTER — Encounter: Payer: Self-pay | Admitting: Family Medicine

## 2015-12-22 ENCOUNTER — Inpatient Hospital Stay (HOSPITAL_COMMUNITY)
Admission: EM | Admit: 2015-12-22 | Discharge: 2016-01-01 | DRG: 190 | Disposition: A | Discharge status: Skilled Nursing Facility | Payer: Medicare Other | Attending: Internal Medicine | Admitting: Internal Medicine

## 2015-12-22 ENCOUNTER — Encounter (HOSPITAL_COMMUNITY): Payer: Self-pay

## 2015-12-22 ENCOUNTER — Other Ambulatory Visit: Payer: Self-pay | Admitting: *Deleted

## 2015-12-22 VITALS — BP 100/56 | HR 90 | Temp 99.8°F | Ht 67.0 in | Wt 161.5 lb

## 2015-12-22 DIAGNOSIS — I5033 Acute on chronic diastolic (congestive) heart failure: Secondary | ICD-10-CM | POA: Diagnosis present

## 2015-12-22 DIAGNOSIS — Z7984 Long term (current) use of oral hypoglycemic drugs: Secondary | ICD-10-CM

## 2015-12-22 DIAGNOSIS — I6523 Occlusion and stenosis of bilateral carotid arteries: Secondary | ICD-10-CM | POA: Diagnosis not present

## 2015-12-22 DIAGNOSIS — Z87891 Personal history of nicotine dependence: Secondary | ICD-10-CM

## 2015-12-22 DIAGNOSIS — E118 Type 2 diabetes mellitus with unspecified complications: Secondary | ICD-10-CM | POA: Diagnosis present

## 2015-12-22 DIAGNOSIS — J9601 Acute respiratory failure with hypoxia: Secondary | ICD-10-CM | POA: Diagnosis present

## 2015-12-22 DIAGNOSIS — M549 Dorsalgia, unspecified: Secondary | ICD-10-CM | POA: Diagnosis present

## 2015-12-22 DIAGNOSIS — I480 Paroxysmal atrial fibrillation: Secondary | ICD-10-CM | POA: Diagnosis present

## 2015-12-22 DIAGNOSIS — Z7189 Other specified counseling: Secondary | ICD-10-CM | POA: Insufficient documentation

## 2015-12-22 DIAGNOSIS — Z9841 Cataract extraction status, right eye: Secondary | ICD-10-CM

## 2015-12-22 DIAGNOSIS — R0902 Hypoxemia: Secondary | ICD-10-CM | POA: Diagnosis not present

## 2015-12-22 DIAGNOSIS — E871 Hypo-osmolality and hyponatremia: Secondary | ICD-10-CM | POA: Diagnosis present

## 2015-12-22 DIAGNOSIS — I2510 Atherosclerotic heart disease of native coronary artery without angina pectoris: Secondary | ICD-10-CM | POA: Diagnosis present

## 2015-12-22 DIAGNOSIS — Z9842 Cataract extraction status, left eye: Secondary | ICD-10-CM

## 2015-12-22 DIAGNOSIS — M353 Polymyalgia rheumatica: Secondary | ICD-10-CM | POA: Diagnosis present

## 2015-12-22 DIAGNOSIS — J9690 Respiratory failure, unspecified, unspecified whether with hypoxia or hypercapnia: Secondary | ICD-10-CM | POA: Clinically undetermined

## 2015-12-22 DIAGNOSIS — Z955 Presence of coronary angioplasty implant and graft: Secondary | ICD-10-CM

## 2015-12-22 DIAGNOSIS — I350 Nonrheumatic aortic (valve) stenosis: Secondary | ICD-10-CM | POA: Diagnosis present

## 2015-12-22 DIAGNOSIS — Z85828 Personal history of other malignant neoplasm of skin: Secondary | ICD-10-CM

## 2015-12-22 DIAGNOSIS — R0602 Shortness of breath: Secondary | ICD-10-CM | POA: Diagnosis not present

## 2015-12-22 DIAGNOSIS — N183 Chronic kidney disease, stage 3 unspecified: Secondary | ICD-10-CM | POA: Diagnosis present

## 2015-12-22 DIAGNOSIS — Z79899 Other long term (current) drug therapy: Secondary | ICD-10-CM

## 2015-12-22 DIAGNOSIS — E1122 Type 2 diabetes mellitus with diabetic chronic kidney disease: Secondary | ICD-10-CM | POA: Diagnosis present

## 2015-12-22 DIAGNOSIS — J189 Pneumonia, unspecified organism: Secondary | ICD-10-CM | POA: Diagnosis present

## 2015-12-22 DIAGNOSIS — K219 Gastro-esophageal reflux disease without esophagitis: Secondary | ICD-10-CM | POA: Diagnosis present

## 2015-12-22 DIAGNOSIS — I5032 Chronic diastolic (congestive) heart failure: Secondary | ICD-10-CM | POA: Diagnosis present

## 2015-12-22 DIAGNOSIS — I4892 Unspecified atrial flutter: Secondary | ICD-10-CM | POA: Diagnosis present

## 2015-12-22 DIAGNOSIS — G4730 Sleep apnea, unspecified: Secondary | ICD-10-CM | POA: Diagnosis present

## 2015-12-22 DIAGNOSIS — I252 Old myocardial infarction: Secondary | ICD-10-CM

## 2015-12-22 DIAGNOSIS — Z7901 Long term (current) use of anticoagulants: Secondary | ICD-10-CM

## 2015-12-22 DIAGNOSIS — I4891 Unspecified atrial fibrillation: Secondary | ICD-10-CM | POA: Diagnosis present

## 2015-12-22 DIAGNOSIS — J440 Chronic obstructive pulmonary disease with acute lower respiratory infection: Principal | ICD-10-CM | POA: Diagnosis present

## 2015-12-22 DIAGNOSIS — G9340 Encephalopathy, unspecified: Secondary | ICD-10-CM | POA: Diagnosis present

## 2015-12-22 DIAGNOSIS — Z7952 Long term (current) use of systemic steroids: Secondary | ICD-10-CM

## 2015-12-22 DIAGNOSIS — Y95 Nosocomial condition: Secondary | ICD-10-CM | POA: Diagnosis present

## 2015-12-22 DIAGNOSIS — E785 Hyperlipidemia, unspecified: Secondary | ICD-10-CM | POA: Diagnosis present

## 2015-12-22 DIAGNOSIS — Z515 Encounter for palliative care: Secondary | ICD-10-CM | POA: Insufficient documentation

## 2015-12-22 DIAGNOSIS — M069 Rheumatoid arthritis, unspecified: Secondary | ICD-10-CM | POA: Diagnosis present

## 2015-12-22 DIAGNOSIS — N179 Acute kidney failure, unspecified: Secondary | ICD-10-CM | POA: Diagnosis present

## 2015-12-22 DIAGNOSIS — Z961 Presence of intraocular lens: Secondary | ICD-10-CM | POA: Diagnosis present

## 2015-12-22 DIAGNOSIS — I130 Hypertensive heart and chronic kidney disease with heart failure and stage 1 through stage 4 chronic kidney disease, or unspecified chronic kidney disease: Secondary | ICD-10-CM | POA: Diagnosis present

## 2015-12-22 DIAGNOSIS — L4050 Arthropathic psoriasis, unspecified: Secondary | ICD-10-CM | POA: Diagnosis present

## 2015-12-22 LAB — CBC WITH DIFFERENTIAL/PLATELET
Basophils Absolute: 0 10*3/uL (ref 0.0–0.1)
Basophils Relative: 0 %
EOS ABS: 0 10*3/uL (ref 0.0–0.7)
EOS PCT: 0 %
HCT: 38.9 % — ABNORMAL LOW (ref 39.0–52.0)
HEMOGLOBIN: 12.4 g/dL — AB (ref 13.0–17.0)
LYMPHS ABS: 1.5 10*3/uL (ref 0.7–4.0)
LYMPHS PCT: 14 %
MCH: 29.1 pg (ref 26.0–34.0)
MCHC: 31.9 g/dL (ref 30.0–36.0)
MCV: 91.3 fL (ref 78.0–100.0)
MONOS PCT: 10 %
Monocytes Absolute: 1.1 10*3/uL — ABNORMAL HIGH (ref 0.1–1.0)
NEUTROS PCT: 76 %
Neutro Abs: 8.1 10*3/uL — ABNORMAL HIGH (ref 1.7–7.7)
Platelets: 277 10*3/uL (ref 150–400)
RBC: 4.26 MIL/uL (ref 4.22–5.81)
RDW: 17.2 % — ABNORMAL HIGH (ref 11.5–15.5)
WBC: 10.8 10*3/uL — AB (ref 4.0–10.5)

## 2015-12-22 LAB — BASIC METABOLIC PANEL
Anion gap: 9 (ref 5–15)
BUN: 26 mg/dL — AB (ref 6–20)
CHLORIDE: 97 mmol/L — AB (ref 101–111)
CO2: 23 mmol/L (ref 22–32)
CREATININE: 1.62 mg/dL — AB (ref 0.61–1.24)
Calcium: 9.2 mg/dL (ref 8.9–10.3)
GFR calc Af Amer: 46 mL/min — ABNORMAL LOW (ref >60)
GFR calc non Af Amer: 40 mL/min — ABNORMAL LOW (ref >60)
GLUCOSE: 278 mg/dL — AB (ref 65–99)
POTASSIUM: 4.7 mmol/L (ref 3.5–5.1)
Sodium: 129 mmol/L — ABNORMAL LOW (ref 135–145)

## 2015-12-22 LAB — I-STAT CG4 LACTIC ACID, ED: Lactic Acid, Venous: 0.95 mmol/L (ref 0.5–1.9)

## 2015-12-22 MED ORDER — DEXTROSE 5 % IV SOLN
2.0000 g | INTRAVENOUS | Status: AC
  Administered 2015-12-22: 2 g via INTRAVENOUS
  Filled 2015-12-22: qty 2

## 2015-12-22 MED ORDER — VANCOMYCIN HCL IN DEXTROSE 1-5 GM/200ML-% IV SOLN
1000.0000 mg | INTRAVENOUS | Status: AC
  Administered 2015-12-23: 1000 mg via INTRAVENOUS
  Filled 2015-12-22: qty 200

## 2015-12-22 NOTE — Progress Notes (Signed)
Pre visit review using our clinic review tool, if applicable. No additional management support is needed unless otherwise documented below in the visit note.  Cardiac blation last week, was feeling well until yesterday.  Felt weak yesterday, vomited yesterday.  Dry heaves today.  Breathing worse with exertion, but that has been noted at baseline per patient report.    No fevers known but temp up slightly at [place] today.  No diarrhea.    No CP.  No fluttering.    He has noted more chest congestion in the last few days but dry cough- no sputum production.    Pulse ox initially ~86 at rest, up to ~90-92 on 2L at OV.    Meds, vitals, and allergies reviewed.   ROS: Per HPI unless specifically indicated in ROS section   nad ncat Mmm OP wnl Neck supple, no LA Rrr, not tachy Coarse BS B, no focal dec in BS, no inc in wob, speaking in complete sentences.  abd soft Ext w/o edema.

## 2015-12-22 NOTE — ED Provider Notes (Signed)
CSN: [id number]     Arrival date & time 12/22/15  1725 History   First MD Initiated Contact with Patient 12/22/15 2205     Chief Complaint  Patient presents with  . Cough  . Nasal Congestion     (Consider location/radiation/quality/duration/timing/severity/associated sxs/prior Treatment) HPI  Dry cough, nauseated yesterday and today, feels like congestion in throat 2 weeks or longer for cough, worsening in last week Short of breath, mild, yesterday or day before started DOE, worse over last 2 days No leg swelling, no orthopnea Feeling of chills/fever   Past Medical History  Diagnosis Date  . CAD (coronary artery disease)     a. 2010 s/p MI/Cath/PCI ([facility]): LM 10, LAD 30p, 40m, D1 20, D2 30, LCX 30p, 79m (3.5 x 15 Driver BMS), [id number], RCA 70p, 54m; b. 04/2011 Neg MV;  c. 04/2015 Myoview: very mild apical thinning, no ischemia, EF 65%.  . Hyperlipidemia     a. takes pravachol.  . Personal history of colonic polyps   . Actinic keratosis   . Psoriatic arthropathy ([place])   . Other psoriasis   . Scoliosis (and kyphoscoliosis), idiopathic   . GI bleed     a. 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  . Gastritis 12/10  . Hypertensive heart disease     a. takes Metoprolol daily  . Dyspnea on exertion   . Joint pain   . Chronic back pain     scoliosis/stenosis/radiculopathy,degenerative disc disease  . Psoriasis   . Bruises easily   . Esophageal reflux     takes Omeprazole daily  . History of colonic polyps   . Hemorrhoids   . History of blood transfusion 2010    "don't remember why"  . PMR (polymyalgia rheumatica) (HCC) 01/20/2012    tx by specialist on a long prednisone taper  . Chronic diastolic CHF (congestive heart failure) ([place])     a. 08/2015 Echo: EF 55-60%, mild LVH, no rwma, Gr1 DD, mod AS, mildly dil LA, PASP 69mmHg.  . Moderate aortic stenosis     a. 08/2015 Mod AS, valve area (VTI) - 1.5cm^2, (Vmax) - 1.25cm^2; b. 10/2015 TEE EF 55-60%, mod AS.  Marland Kitchen Paroxysmal atrial  fibrillation ([place])     a. 06/2011 Post-op AF-->converted on amio;  b. 07/2015 TEE/unsuccessful DCCV x 2; c. 09/2015 recurrent AF/Flutter-->successful DCCV; d. 09/2015 Recurrent AF-->10/2015 s/p TEE (EF 55-60%, mod AS, mild MR, sev dil LA w/o LAA thrombus, sev dil RA, mild to mod TR) & DCCV (120 J x 1).  . Lumbar stenosis     a. s/p lumbar diskectomy 2017.  . Carotid arterial disease ([place])     a. 10/2015 Carotid US: bilat 1-39% heterogeneous plaque/stenosis. Nl subclavian arteries bilat.  Marland Kitchen STEMI (ST elevation myocardial infarction) ([place]) 02/2010    Archie Endo 05/2009  . Pneumonia 2008; 07/2015  . COPD (chronic obstructive pulmonary disease) ([place]) dx'd 12/10/2015  . Sleep apnea     "had it while in hospital in 07/2015" (12/15/2015)  . Type II diabetes mellitus ([place])   . Rheumatoid arthritis(714.0)     takes Methotrexate 7pills weekly  . Degeneration of intervertebral disc, site unspecified   . Arthritis     "hips" (12/15/2015)  . CKD (chronic kidney disease), stage III     a. 09/2015 AKI in setting of dehydration - seen @ [facility].  . Nephrolithiasis   . Skin cancer     "arms; they freeze then off; cut one off left elbow recently" (12/15/2015)   Past  Surgical History  Procedure Laterality Date  . Shoulder open rotator cuff repair Left 2000  . Colonoscopy    . Vasectomy  1979  . Carotid doppler  10/12    0-39% R and 60-79% left   . Cataract extraction w/ intraocular lens  implant, bilateral Bilateral 2012  . Lithotripsy  2009  . Tee without cardioversion N/A 08/06/2015    Procedure: TRANSESOPHAGEAL ECHOCARDIOGRAM (TEE);  Surgeon: Skeet Latch, MD;  Location: [address];  Service: Cardiovascular;  Laterality: N/A;  . Cardioversion N/A 08/06/2015    Procedure: CARDIOVERSION;  Surgeon: Skeet Latch, MD;  Location: [facility] ENDOSCOPY;  Service: Cardiovascular;  Laterality: N/A;  . Back surgery    . Lumbar laminectomy  07/2015  . Posterior laminectomy thoracic spine      "fixed the discs"  . Skin cancer  excision      "arms"  . Tee without cardioversion N/A 09/14/2015    Procedure: TRANSESOPHAGEAL ECHOCARDIOGRAM (TEE);  Surgeon: Jerline Pain, MD;  Location: [place];  Service: Cardiovascular;  Laterality: N/A;  . Cardioversion N/A 09/14/2015    Procedure: CARDIOVERSION;  Surgeon: Jerline Pain, MD;  Location: [place];  Service: Cardiovascular;  Laterality: N/A;  . Tee without cardioversion N/A 11/04/2015    Procedure: TRANSESOPHAGEAL ECHOCARDIOGRAM (TEE);  Surgeon: Sanda Klein, MD;  Location: [place];  Service: Cardiovascular;  Laterality: N/A;  . Cardioversion N/A 11/04/2015    Procedure: CARDIOVERSION;  Surgeon: Sanda Klein, MD;  [facility];  Service: Cardiovascular;  Laterality: N/A;  . Electrophysiologic study N/A 12/15/2015    Procedure: A-Flutter;  Surgeon: Will Meredith Leeds, MD;  Location: [place] [facility];  Service: Cardiovascular;  Laterality: N/A;  . Coronary angioplasty with stent placement  02/2009    has one stent  . Cardiac catheterization  2010  . Laminectomy thoracic fusion posterior  06/2011    Decompressive laminectomy at L12, L2-3, L4-5; posterior segmental agitation T10-L5 with globus titanium pedicle screws and rods; T10-11, and T11-12, T12-L1, L1-2, L2-3, L3-4, L4-5 posterior lateral arthrodesis with local morselized autograft bone, bone morphogenic protein, Actifuse bone graft extender./notes 07/03/2011   Family History  Problem Relation Age of Onset  . Coronary artery disease Mother   . Diabetes Mother   . CAD Mother   . Coronary artery disease Sister   . Hypertension Sister   . Kidney failure Sister   . CAD Sister   . Diabetes Brother   . Prostate cancer Brother   . Pancreatic cancer Brother   . Diabetes Brother   . Anesthesia problems Neg Hx   . Hypotension Neg Hx   . Malignant hyperthermia Neg Hx   . Pseudochol deficiency Neg Hx    Social History  Substance Use Topics  . Smoking status: Former Smoker -- 1.50 packs/day for 53  years    Types: Cigarettes    Quit date: 11/22/2008  . Smokeless tobacco: Never Used  . Alcohol Use: 0.0 oz/week    0 Standard drinks or equivalent per week     Comment: 7/5//2017 "might have 1-2 margaritas in the summertime"    Review of Systems  Constitutional: Positive for fever (subjective), chills, appetite change and fatigue.  HENT: Negative for sore throat.   Eyes: Negative for visual disturbance.  Respiratory: Positive for cough and shortness of breath.   Cardiovascular: Negative for chest pain and leg swelling.  Gastrointestinal: Positive for nausea and vomiting (1 episode yesterday). Negative for abdominal pain and diarrhea.  Genitourinary: Negative for difficulty urinating.  Musculoskeletal:  Negative for back pain and neck stiffness.  Skin: Negative for rash.  Neurological: Positive for headaches. Negative for syncope.      Allergies  Ace inhibitors; Angiotensin receptor blockers; Metoprolol; Penicillins; and Tetracycline  Home Medications   Prior to Admission medications   Medication Sig Start Date End Date Taking? Authorizing Provider  acetaminophen (TYLENOL) 325 MG tablet Take 650 mg by mouth every 6 (six) hours as needed (Pain scale of 1-4 or temp > 101).   Yes Historical Provider, MD  albuterol (PROVENTIL HFA;VENTOLIN HFA) 108 (90 Base) MCG/ACT inhaler Inhale 2 puffs into the lungs every 6 (six) hours as needed for wheezing or shortness of breath. 11/10/15  Yes Abner Greenspan, MD  amiodarone (PACERONE) 200 MG tablet Take 1 tablet (200 mg total) by mouth 2 (two) times daily. 11/10/15  Yes Rogelia Mire, NP  calcium-vitamin D (OSCAL WITH D) 500-200 MG-UNIT tablet Take 1 tablet by mouth daily with breakfast.   Yes Historical Provider, MD  diltiazem (CARDIZEM CD) 240 MG 24 hr capsule TAKE 1 CAPSULE BY MOUTH DAILY 10/14/15  Yes Abner Greenspan, MD  docusate sodium (COLACE) 100 MG capsule Take 100 mg by mouth 2 (two) times daily. Reported on 09/22/2015   Yes Historical  Provider, MD  ferrous sulfate 325 (65 FE) MG tablet Take 1 tablet (325 mg total) by mouth 3 (three) times daily with meals. 10/26/15  Yes Abner Greenspan, MD  glipiZIDE (GLUCOTROL) 10 MG tablet Take 1 tablet (10 mg total) by mouth 2 (two) times daily before a meal. 10/29/15  Yes Abner Greenspan, MD  pantoprazole (PROTONIX) 40 MG tablet Take 1 tablet (40 mg total) by mouth daily. 10/11/15  Yes Tonia Ghent, MD  pravastatin (PRAVACHOL) 20 MG tablet Take 1 tablet (20 mg total) by mouth every evening. 11/10/15  Yes Rogelia Mire, NP  predniSONE (DELTASONE) 5 MG tablet Take 1 tablet (5 mg total) by mouth daily with breakfast. 12/03/15  Yes Abner Greenspan, MD  thiamine 100 MG tablet Take 1 tablet (100 mg total) by mouth daily. 10/11/15  Yes Tonia Ghent, MD  tiotropium (SPIRIVA) 18 MCG inhalation capsule Place 1 capsule (18 mcg total) into inhaler and inhale daily. 12/07/15  Yes Jearld Fenton, NP  warfarin (COUMADIN) 2.5 MG tablet Take 2.5-5 mg by mouth See admin instructions. Take 2 tablets on Monday, Wednesday and Friday then take 1 tablet all the other days   Yes Historical Provider, MD  glucose blood (ONE TOUCH ULTRA TEST) test strip Check blood sugar three times daily and as directed. Dx E11.8 diabetes that is poorly controlled 11/15/15   Abner Greenspan, MD  warfarin (COUMADIN) 2.5 MG tablet TAKE AS DIRECTED BY ANTI-COAGULATION CLINIC. Patient not taking: Reported on 12/23/2015 12/21/15   Wynelle Fanny Tower, MD   BP 120/75 mmHg  Pulse 82  Temp(Src) 99.1 F (37.3 C) (Oral)  Resp 30  Ht 5\' 8"  (1.727 m)  Wt 164 lb (74.39 kg)  BMI 24.94 kg/m2  SpO2 91% Physical Exam  Constitutional: He is oriented to person, place, and time. He appears well-developed and well-nourished. No distress.  HENT:  Head: Normocephalic and atraumatic.  Eyes: Conjunctivae and EOM are normal.  Neck: Normal range of motion. No JVD present.  Cardiovascular: Normal rate, regular rhythm, normal heart sounds and intact distal pulses.   Exam reveals no gallop and no friction rub.   No murmur heard. Pulmonary/Chest: Effort normal. No respiratory distress. He has no wheezes.  He has rales (diffuse).  Abdominal: Soft. He exhibits no distension. There is no tenderness. There is no guarding.  Musculoskeletal: He exhibits no edema (trace).  Neurological: He is alert and oriented to person, place, and time.  Skin: Skin is warm and dry. He is not diaphoretic.  Nursing note and vitals reviewed.   ED Course  Procedures (including critical care time) Labs Review Labs Reviewed  CBC WITH DIFFERENTIAL/PLATELET - Abnormal; Notable for the following:    WBC 10.8 (*)    Hemoglobin 12.4 (*)    HCT 38.9 (*)    RDW 17.2 (*)    Neutro Abs 8.1 (*)    Monocytes Absolute 1.1 (*)    All other components within normal limits  BASIC METABOLIC PANEL - Abnormal; Notable for the following:    Sodium 129 (*)    Chloride 97 (*)    Glucose, Bld 278 (*)    BUN 26 (*)    Creatinine, Ser 1.62 (*)    GFR calc non Af Amer 40 (*)    GFR calc Af Amer 46 (*)    All other components within normal limits  BRAIN NATRIURETIC PEPTIDE - Abnormal; Notable for the following:    B Natriuretic Peptide 136.8 (*)    All other components within normal limits  PROTIME-INR - Abnormal; Notable for the following:    Prothrombin Time 25.0 (*)    INR 2.29 (*)    All other components within normal limits  PROTIME-INR - Abnormal; Notable for the following:    Prothrombin Time 24.1 (*)    INR 2.18 (*)    All other components within normal limits  CBG MONITORING, ED - Abnormal; Notable for the following:    Glucose-Capillary 186 (*)    All other components within normal limits  CULTURE, BLOOD (ROUTINE X 2)  CULTURE, BLOOD (ROUTINE X 2)  RESPIRATORY PANEL BY PCR  CULTURE, EXPECTORATED SPUTUM-ASSESSMENT  GRAM STAIN  OSMOLALITY  CORTISOL-AM, BLOOD  STREP PNEUMONIAE URINARY ANTIGEN  LEGIONELLA PNEUMOPHILA SEROGP 1 UR AG  OSMOLALITY, URINE  SODIUM, URINE, RANDOM   I-STAT CG4 LACTIC ACID, ED    Imaging Review Dg Chest 2 View  12/22/2015  CLINICAL DATA:  Cough and congestion for 2 days. Nausea. Question pneumonia. EXAM: CHEST  2 VIEW COMPARISON:  Two-view chest x-ray 11/10/2015. FINDINGS: The heart is mildly enlarged. There is slight increase in a diffuse interstitial pattern suggesting edema superimposed on chronic interstitial coarsening. Superior segment right lower lobe and right upper lobe airspace disease is present. Remote compression fractures are again noted in the mid thoracic spine. Thoracolumbar fusion is noted. IMPRESSION: 1. Superior segment right lower lobe and right upper lobe airspace disease concerning for pneumonia. 2. Mild generalized edema superimposed on chronic interstitial disease. Electronically Signed   By: San Morelle M.D.   On: 12/22/2015 18:36   I have personally reviewed and evaluated these images and lab results as part of my medical decision-making.   EKG Interpretation   Date/Time:  Thursday December 23 2015 01:54:38 EDT Ventricular Rate:  89 PR Interval:    QRS Duration: 109 QT Interval:  375 QTC Calculation: 457 R Axis:   -74 Text Interpretation:  Sinus rhythm Multiple ventricular premature  complexes Left atrial enlargement Left anterior fascicular block no  significant change since December 16 2015 Confirmed by Regenia Skeeter MD, SCOTT  [phone]) on 12/23/2015 2:07:23 AM      MDM   Final diagnoses:  Healthcare-associated pneumonia  Hypoxia   75yo male with  history of CAD, hyperlipidemia, psoriatic arthritis, DM, CKD, COPD, presents with concern for 2 weeks of cough with nausea, shortness of breath, chills over last 2 days.  Pt with hypoxia. CXR shows signs of pneumonia, mild edema. Hyponatremia 129. Cr at baseline, lactic acid WNL. Given concerns for mixed picture of CHF and pneumonia, normal lactic acid, normal perfusion, Cr baseline and doubt sepsis will give not given large volume fluid resuscitation. Pt given  vanc/cefepime for HCAP.  O2 demand increased to 6L.  Will admit to stepdown unit.   Gareth Morgan, MD 12/23/15 [phone]

## 2015-12-22 NOTE — Patient Instructions (Addendum)
History of CHF with recent cardiac ablation.   Now with vomiting and more SOB in the last 2 days.   O2 86% at MD office, sent by private care to ER for eval.    Go to the ER at [facility].  I called ahead.   Take care.  Glad to see you.

## 2015-12-22 NOTE — ED Notes (Signed)
Patient here with cough and congestion x 2 days, reports some nausea with same. Here to be evaluated for possible pneumonia. No shortness of breath, No CP

## 2015-12-22 NOTE — Assessment & Plan Note (Signed)
D/w pt.  He is going to need ER eval.  Declined EMS transport.   Daughter to take him directly to [facility] ER and we called ahead re: pending arrival.  I don't know if he aspirated, d/w pt.   He's likely going to need routine w/u, ie CXR/labs/continued O2 and I can't provide that at this clinic quickly enough for outpatient mgmt (ie lag time on lab results).  D/w pt . He agrees.  Currently in route to ER.   App help of all involved.  Routed to PCP as FYI.

## 2015-12-22 NOTE — ED Notes (Signed)
Checked pt vitals his oxygen ranged from 85-87%. Switched finger and pt jumped to 94. Daughter stated that his O2 was low at the Dr. And that's why they sent him here.

## 2015-12-22 NOTE — Patient Outreach (Addendum)
[facility]) Care Management   12/22/2015  Curtis Frazier Dec 01, 1940 [id number]  Curtis Frazier is an 75 y.o. male  Subjective:  Curtis Frazier reports feeling queasy for the last few days, vomited on yesterday, still able tolerate eating small meals throughout the day. Curtis Frazier complaint of feeling short of breath with activity , denies while sitting in chair. Patient complaint of cough being nonproductive,discussed his recent diagnosis of COPD. Curtis Frazier voiced concern about patient losing weight .   Objective: BP 128/68 mmHg  Pulse 80  Resp 20  Wt 155 lb (70.308 kg)  SpO2 92%  Patient resting in chair during visit. Curtis Frazier on the telephone having difficulty with dialing   to make an appointment with MD for patient related to his complaints on today. Review of Systems  Constitutional: Positive for weight loss.       Complains of feeling weak Temperature 99  HENT: Negative.   Eyes: Negative.   Respiratory: Positive for cough and shortness of breath.        Non productive cough, complaint of short of breath with activity  Cardiovascular: Negative for chest pain and leg swelling.  Gastrointestinal: Positive for nausea and vomiting.       Reports vomiting on 7/11  Genitourinary: Negative.   Musculoskeletal:       Bilateral hip pain  Skin: Negative.   Neurological:       Complaint " I just haven't gotten my balance back"  Psychiatric/Behavioral: Negative.     Physical Exam  Constitutional: He is oriented to person, place, and time. He appears well-developed.  Cardiovascular: Normal rate.   Respiratory: No respiratory distress. He has no wheezes. He has no rales.  Reports short of breath with activity Congested sounding cough, non productive   GI: Soft.  Neurological: He is alert and oriented to person, place, and time.  Skin: Skin is warm and dry.  Psychiatric: He has a normal mood and affect. His behavior is normal. Judgment and thought content normal.   Patient  was recently discharged from hospital and all medications have been reviewed. Encounter Medications:   Outpatient Encounter Prescriptions as of 12/22/2015  Medication Sig  . acetaminophen (TYLENOL) 325 MG tablet Take 650 mg by mouth every 6 (six) hours as needed (Pain scale of 1-4 or temp > 101).  Marland Kitchen albuterol (PROVENTIL HFA;VENTOLIN HFA) 108 (90 Base) MCG/ACT inhaler Inhale 2 puffs into the lungs every 6 (six) hours as needed for wheezing or shortness of breath.  Marland Kitchen amiodarone (PACERONE) 200 MG tablet Take 1 tablet (200 mg total) by mouth 2 (two) times daily.  . calcium-vitamin D (OSCAL WITH D) 500-200 MG-UNIT tablet Take 1 tablet by mouth daily with breakfast.  . diltiazem (CARDIZEM CD) 240 MG 24 hr capsule TAKE 1 CAPSULE BY MOUTH DAILY  . docusate sodium (COLACE) 100 MG capsule Take 100 mg by mouth 2 (two) times daily. Reported on 09/22/2015  . ferrous sulfate 325 (65 FE) MG tablet Take 1 tablet (325 mg total) by mouth 3 (three) times daily with meals.  Marland Kitchen glipiZIDE (GLUCOTROL) 10 MG tablet Take 1 tablet (10 mg total) by mouth 2 (two) times daily before a meal.  . glucose blood (ONE TOUCH ULTRA TEST) test strip Check blood sugar three times daily and as directed. Dx E11.8 diabetes that is poorly controlled  . pantoprazole (PROTONIX) 40 MG tablet Take 1 tablet (40 mg total) by mouth daily.  . pravastatin (PRAVACHOL) 20 MG tablet Take 1 tablet (20 mg total)  by mouth every evening.  . predniSONE (DELTASONE) 5 MG tablet Take 1 tablet (5 mg total) by mouth daily with breakfast.  . thiamine 100 MG tablet Take 1 tablet (100 mg total) by mouth daily.  Marland Kitchen tiotropium (SPIRIVA) 18 MCG inhalation capsule Place 1 capsule (18 mcg total) into inhaler and inhale daily.  Marland Kitchen warfarin (COUMADIN) 2.5 MG tablet TAKE AS DIRECTED BY ANTI-COAGULATION CLINIC.   No facility-administered encounter medications on file as of 12/22/2015.    Functional Status:   In your present state of health, do you have any difficulty  performing the following activities: 12/22/2015 12/15/2015  Hearing? [name]  Vision? N N  Difficulty concentrating or making decisions? N N  Walking or climbing stairs? Y N  Dressing or bathing? N N  Doing errands, shopping? [name]  Preparing Food and eating ? N -  Using the Toilet? N -  In the past six months, have you accidently leaked urine? N -  Do you have problems with loss of bowel control? N -  Managing your Medications? N -  Managing your Finances? N -  Housekeeping or managing your Housekeeping? N -    Fall/Depression Screening:    PHQ 2/9 Scores 12/22/2015 12/10/2015 10/04/2015 06/28/2015 11/23/2014 07/07/2013 07/05/2012  PHQ - 2 Score 0 0 0 0 0 0 0    Assessment:  Routine home visit  Patient concern regarding nausea, vomiting , cough weight down 6 pounds in the last month, shortness of breath with activity - will benefit from PCP follow up for further evaluation, appointment scheduled for 4:14 on today.   Plan:  Assist patient's wife with scheduling PCP visit for Curtis Frazier on today. Patient will attend office visit today. Will follow up with patient within next week for transition of care or sooner to complete evaluations. [facility] CM Care Plan Problem One        Most Recent Value   Care Plan Problem One  Recent hospitalization for atrial flutter procedure   Role Documenting the Problem One  Care Management [place] for Problem One  Active   [facility] Long Term Goal (31-90 days)  Patient will not readmit to hospital in the next 31 days   [facility] Long Term Goal Start Date  12/17/15   Interventions for Problem One Long Term Goal  Patient with new symptoms, assisted wife placing call to PCP office to arrange visit today.   [facility] CM Short Term Goal #1 (0-30 days)  Patient will follow up with MD office appointment at scheduled time in the next 14 days   [facility] CM Short Term Goal #1 Start Date  12/17/15   Interventions for Short Term Goal #1  Patient to call and verify next appointment with  cardiology    [facility] CM Short Term Goal #2 (0-30 days)  Patient will be able to state knowledge of and adhere to discharge intructions on activity limits and when to call MD in the next 7 days   [facility] CM Short Term Goal #2 Start Date  12/17/15   Interventions for Short Term Goal #2  Reviewed with patient discharge instructions on activitiy weight restrictions and when to notify MD      [name], RN, [place] [phone]- Mobile [phone]- [place]

## 2015-12-23 DIAGNOSIS — I350 Nonrheumatic aortic (valve) stenosis: Secondary | ICD-10-CM | POA: Diagnosis present

## 2015-12-23 DIAGNOSIS — I5033 Acute on chronic diastolic (congestive) heart failure: Secondary | ICD-10-CM | POA: Diagnosis present

## 2015-12-23 DIAGNOSIS — J440 Chronic obstructive pulmonary disease with acute lower respiratory infection: Secondary | ICD-10-CM | POA: Diagnosis present

## 2015-12-23 DIAGNOSIS — J9601 Acute respiratory failure with hypoxia: Secondary | ICD-10-CM | POA: Diagnosis present

## 2015-12-23 DIAGNOSIS — J189 Pneumonia, unspecified organism: Secondary | ICD-10-CM | POA: Diagnosis present

## 2015-12-23 DIAGNOSIS — M069 Rheumatoid arthritis, unspecified: Secondary | ICD-10-CM | POA: Diagnosis present

## 2015-12-23 DIAGNOSIS — I5032 Chronic diastolic (congestive) heart failure: Secondary | ICD-10-CM | POA: Diagnosis not present

## 2015-12-23 DIAGNOSIS — I252 Old myocardial infarction: Secondary | ICD-10-CM | POA: Diagnosis not present

## 2015-12-23 DIAGNOSIS — I480 Paroxysmal atrial fibrillation: Secondary | ICD-10-CM | POA: Diagnosis present

## 2015-12-23 DIAGNOSIS — I4891 Unspecified atrial fibrillation: Secondary | ICD-10-CM | POA: Diagnosis not present

## 2015-12-23 DIAGNOSIS — Z789 Other specified health status: Secondary | ICD-10-CM | POA: Diagnosis not present

## 2015-12-23 DIAGNOSIS — I4892 Unspecified atrial flutter: Secondary | ICD-10-CM | POA: Diagnosis present

## 2015-12-23 DIAGNOSIS — E1122 Type 2 diabetes mellitus with diabetic chronic kidney disease: Secondary | ICD-10-CM | POA: Diagnosis present

## 2015-12-23 DIAGNOSIS — G4730 Sleep apnea, unspecified: Secondary | ICD-10-CM | POA: Diagnosis present

## 2015-12-23 DIAGNOSIS — M353 Polymyalgia rheumatica: Secondary | ICD-10-CM | POA: Diagnosis present

## 2015-12-23 DIAGNOSIS — I130 Hypertensive heart and chronic kidney disease with heart failure and stage 1 through stage 4 chronic kidney disease, or unspecified chronic kidney disease: Secondary | ICD-10-CM | POA: Diagnosis present

## 2015-12-23 DIAGNOSIS — Z955 Presence of coronary angioplasty implant and graft: Secondary | ICD-10-CM | POA: Diagnosis not present

## 2015-12-23 DIAGNOSIS — K219 Gastro-esophageal reflux disease without esophagitis: Secondary | ICD-10-CM | POA: Diagnosis present

## 2015-12-23 DIAGNOSIS — Z7189 Other specified counseling: Secondary | ICD-10-CM | POA: Diagnosis not present

## 2015-12-23 DIAGNOSIS — L4050 Arthropathic psoriasis, unspecified: Secondary | ICD-10-CM | POA: Diagnosis present

## 2015-12-23 DIAGNOSIS — I483 Typical atrial flutter: Secondary | ICD-10-CM | POA: Diagnosis not present

## 2015-12-23 DIAGNOSIS — E785 Hyperlipidemia, unspecified: Secondary | ICD-10-CM | POA: Diagnosis not present

## 2015-12-23 DIAGNOSIS — N183 Chronic kidney disease, stage 3 (moderate): Secondary | ICD-10-CM

## 2015-12-23 DIAGNOSIS — E118 Type 2 diabetes mellitus with unspecified complications: Secondary | ICD-10-CM | POA: Diagnosis not present

## 2015-12-23 DIAGNOSIS — Z515 Encounter for palliative care: Secondary | ICD-10-CM | POA: Diagnosis present

## 2015-12-23 DIAGNOSIS — E871 Hypo-osmolality and hyponatremia: Secondary | ICD-10-CM | POA: Diagnosis present

## 2015-12-23 DIAGNOSIS — Z85828 Personal history of other malignant neoplasm of skin: Secondary | ICD-10-CM | POA: Diagnosis not present

## 2015-12-23 DIAGNOSIS — Y95 Nosocomial condition: Secondary | ICD-10-CM | POA: Diagnosis present

## 2015-12-23 DIAGNOSIS — I2510 Atherosclerotic heart disease of native coronary artery without angina pectoris: Secondary | ICD-10-CM | POA: Diagnosis present

## 2015-12-23 DIAGNOSIS — R0602 Shortness of breath: Secondary | ICD-10-CM | POA: Diagnosis present

## 2015-12-23 DIAGNOSIS — N179 Acute kidney failure, unspecified: Secondary | ICD-10-CM | POA: Diagnosis present

## 2015-12-23 DIAGNOSIS — G9340 Encephalopathy, unspecified: Secondary | ICD-10-CM | POA: Diagnosis present

## 2015-12-23 LAB — EXPECTORATED SPUTUM ASSESSMENT W REFEX TO RESP CULTURE

## 2015-12-23 LAB — BASIC METABOLIC PANEL
Anion gap: 9 (ref 5–15)
BUN: 22 mg/dL — AB (ref 6–20)
CALCIUM: 8.9 mg/dL (ref 8.9–10.3)
CHLORIDE: 101 mmol/L (ref 101–111)
CO2: 21 mmol/L — AB (ref 22–32)
CREATININE: 1.44 mg/dL — AB (ref 0.61–1.24)
GFR calc non Af Amer: 46 mL/min — ABNORMAL LOW (ref >60)
GFR, EST AFRICAN AMERICAN: 53 mL/min — AB (ref >60)
GLUCOSE: 272 mg/dL — AB (ref 65–99)
Potassium: 4.4 mmol/L (ref 3.5–5.1)
Sodium: 131 mmol/L — ABNORMAL LOW (ref 135–145)

## 2015-12-23 LAB — GLUCOSE, CAPILLARY
GLUCOSE-CAPILLARY: 266 mg/dL — AB (ref 65–99)
GLUCOSE-CAPILLARY: 156 mg/dL — AB (ref 65–99)
Glucose-Capillary: 372 mg/dL — ABNORMAL HIGH (ref 65–99)
Glucose-Capillary: 240 mg/dL — ABNORMAL HIGH (ref 65–99)

## 2015-12-23 LAB — TSH: TSH: 0.66 u[IU]/mL (ref 0.350–4.500)

## 2015-12-23 LAB — EXPECTORATED SPUTUM ASSESSMENT W GRAM STAIN, RFLX TO RESP C

## 2015-12-23 LAB — CBC
HCT: 37.6 % — ABNORMAL LOW (ref 39.0–52.0)
Hemoglobin: 12 g/dL — ABNORMAL LOW (ref 13.0–17.0)
MCH: 28.9 pg (ref 26.0–34.0)
MCHC: 31.9 g/dL (ref 30.0–36.0)
MCV: 90.6 fL (ref 78.0–100.0)
PLATELETS: 272 10*3/uL (ref 150–400)
RBC: 4.15 MIL/uL — AB (ref 4.22–5.81)
RDW: 17.1 % — ABNORMAL HIGH (ref 11.5–15.5)
WBC: 10.7 10*3/uL — ABNORMAL HIGH (ref 4.0–10.5)

## 2015-12-23 LAB — PROTIME-INR
INR: 2.18 — AB (ref 0.00–1.49)
INR: 2.29 — ABNORMAL HIGH (ref 0.00–1.49)
PROTHROMBIN TIME: 24.1 s — AB (ref 11.6–15.2)
Prothrombin Time: 25 seconds — ABNORMAL HIGH (ref 11.6–15.2)

## 2015-12-23 LAB — CORTISOL-AM, BLOOD: Cortisol - AM: 53.9 ug/dL — ABNORMAL HIGH (ref 6.7–22.6)

## 2015-12-23 LAB — SODIUM, URINE, RANDOM: Sodium, Ur: 43 mmol/L

## 2015-12-23 LAB — OSMOLALITY: OSMOLALITY: 288 mosm/kg (ref 275–295)

## 2015-12-23 LAB — CBG MONITORING, ED: GLUCOSE-CAPILLARY: 186 mg/dL — AB (ref 65–99)

## 2015-12-23 LAB — OSMOLALITY, URINE: Osmolality, Ur: 461 mOsm/kg (ref 300–900)

## 2015-12-23 LAB — BRAIN NATRIURETIC PEPTIDE: B Natriuretic Peptide: 136.8 pg/mL — ABNORMAL HIGH (ref 0.0–100.0)

## 2015-12-23 LAB — MRSA PCR SCREENING: MRSA BY PCR: NEGATIVE

## 2015-12-23 MED ORDER — ACETAMINOPHEN 325 MG PO TABS
650.0000 mg | ORAL_TABLET | Freq: Four times a day (QID) | ORAL | Status: DC | PRN
Start: 2015-12-23 — End: 2016-01-01

## 2015-12-23 MED ORDER — INSULIN ASPART 100 UNIT/ML ~~LOC~~ SOLN
0.0000 [IU] | Freq: Every day | SUBCUTANEOUS | Status: DC
  Administered 2015-12-23: 0 [IU] via SUBCUTANEOUS
  Administered 2015-12-24: 2 [IU] via SUBCUTANEOUS
  Administered 2015-12-26: 4 [IU] via SUBCUTANEOUS

## 2015-12-23 MED ORDER — HYDROCORTISONE NA SUCCINATE PF 100 MG IJ SOLR
50.0000 mg | Freq: Once | INTRAMUSCULAR | Status: AC
  Administered 2015-12-23: 50 mg via INTRAVENOUS
  Filled 2015-12-23: qty 2

## 2015-12-23 MED ORDER — PANTOPRAZOLE SODIUM 40 MG PO TBEC
40.0000 mg | DELAYED_RELEASE_TABLET | Freq: Every day | ORAL | Status: DC
  Administered 2015-12-23 – 2016-01-01 (×10): 40 mg via ORAL
  Filled 2015-12-23 (×10): qty 1

## 2015-12-23 MED ORDER — DM-GUAIFENESIN ER 30-600 MG PO TB12
1.0000 | ORAL_TABLET | Freq: Two times a day (BID) | ORAL | Status: DC
  Administered 2015-12-23 (×2): 1 via ORAL
  Filled 2015-12-23: qty 1

## 2015-12-23 MED ORDER — CALCIUM CARBONATE-VITAMIN D 500-200 MG-UNIT PO TABS
1.0000 | ORAL_TABLET | Freq: Every day | ORAL | Status: DC
  Administered 2015-12-24 – 2016-01-01 (×9): 1 via ORAL
  Filled 2015-12-23 (×11): qty 1

## 2015-12-23 MED ORDER — BUDESONIDE 0.25 MG/2ML IN SUSP
0.2500 mg | Freq: Two times a day (BID) | RESPIRATORY_TRACT | Status: DC
  Administered 2015-12-23 – 2015-12-25 (×5): 0.25 mg via RESPIRATORY_TRACT
  Filled 2015-12-23 (×4): qty 2

## 2015-12-23 MED ORDER — DM-GUAIFENESIN ER 30-600 MG PO TB12
2.0000 | ORAL_TABLET | Freq: Two times a day (BID) | ORAL | Status: DC
  Administered 2015-12-23 – 2016-01-01 (×18): 2 via ORAL
  Filled 2015-12-23 (×16): qty 2
  Filled 2015-12-23: qty 1
  Filled 2015-12-23: qty 2

## 2015-12-23 MED ORDER — WARFARIN - PHARMACIST DOSING INPATIENT
Freq: Every day | Status: DC
  Administered 2015-12-23: 18:00:00
  Administered 2015-12-30: 1
  Administered 2016-01-01: 18:00:00

## 2015-12-23 MED ORDER — INSULIN ASPART 100 UNIT/ML ~~LOC~~ SOLN
0.0000 [IU] | Freq: Three times a day (TID) | SUBCUTANEOUS | Status: DC
  Administered 2015-12-23 (×2): 3 [IU] via SUBCUTANEOUS
  Administered 2015-12-23: 9 [IU] via SUBCUTANEOUS
  Administered 2015-12-24 (×2): 1 [IU] via SUBCUTANEOUS
  Administered 2015-12-24: 3 [IU] via SUBCUTANEOUS
  Administered 2015-12-25: 5 [IU] via SUBCUTANEOUS
  Administered 2015-12-25: 2 [IU] via SUBCUTANEOUS
  Administered 2015-12-25: 7 [IU] via SUBCUTANEOUS
  Administered 2015-12-26: 3 [IU] via SUBCUTANEOUS
  Administered 2015-12-26: 7 [IU] via SUBCUTANEOUS
  Administered 2015-12-26: 5 [IU] via SUBCUTANEOUS

## 2015-12-23 MED ORDER — VANCOMYCIN HCL IN DEXTROSE 1-5 GM/200ML-% IV SOLN
1000.0000 mg | INTRAVENOUS | Status: DC
  Administered 2015-12-23: 1000 mg via INTRAVENOUS
  Filled 2015-12-23 (×3): qty 200

## 2015-12-23 MED ORDER — INSULIN DETEMIR 100 UNIT/ML ~~LOC~~ SOLN
5.0000 [IU] | Freq: Every day | SUBCUTANEOUS | Status: DC
  Administered 2015-12-23 – 2015-12-25 (×3): 5 [IU] via SUBCUTANEOUS
  Filled 2015-12-23 (×4): qty 0.05

## 2015-12-23 MED ORDER — WARFARIN SODIUM 5 MG PO TABS
5.0000 mg | ORAL_TABLET | ORAL | Status: DC
  Administered 2015-12-24: 5 mg via ORAL
  Filled 2015-12-23: qty 1

## 2015-12-23 MED ORDER — WARFARIN SODIUM 5 MG PO TABS
2.5000 mg | ORAL_TABLET | ORAL | Status: DC
  Administered 2015-12-23: 2.5 mg via ORAL
  Filled 2015-12-23: qty 1

## 2015-12-23 MED ORDER — DOCUSATE SODIUM 100 MG PO CAPS
100.0000 mg | ORAL_CAPSULE | Freq: Two times a day (BID) | ORAL | Status: DC
  Administered 2015-12-23 – 2016-01-01 (×16): 100 mg via ORAL
  Filled 2015-12-23 (×17): qty 1

## 2015-12-23 MED ORDER — DILTIAZEM HCL ER COATED BEADS 240 MG PO CP24
240.0000 mg | ORAL_CAPSULE | Freq: Every day | ORAL | Status: DC
  Administered 2015-12-23 – 2016-01-01 (×10): 240 mg via ORAL
  Filled 2015-12-23 (×10): qty 1

## 2015-12-23 MED ORDER — FERROUS SULFATE 325 (65 FE) MG PO TABS
325.0000 mg | ORAL_TABLET | Freq: Three times a day (TID) | ORAL | Status: DC
  Administered 2015-12-23 – 2016-01-01 (×30): 325 mg via ORAL
  Filled 2015-12-23 (×30): qty 1

## 2015-12-23 MED ORDER — AMIODARONE HCL 200 MG PO TABS
200.0000 mg | ORAL_TABLET | Freq: Two times a day (BID) | ORAL | Status: DC
  Administered 2015-12-23 – 2015-12-26 (×9): 200 mg via ORAL
  Filled 2015-12-23 (×10): qty 1

## 2015-12-23 MED ORDER — PREDNISONE 5 MG PO TABS
5.0000 mg | ORAL_TABLET | Freq: Every day | ORAL | Status: DC
  Administered 2015-12-23 – 2016-01-01 (×10): 5 mg via ORAL
  Filled 2015-12-23 (×11): qty 1

## 2015-12-23 MED ORDER — VITAMIN B-1 100 MG PO TABS
100.0000 mg | ORAL_TABLET | Freq: Every day | ORAL | Status: DC
  Administered 2015-12-23 – 2016-01-01 (×10): 100 mg via ORAL
  Filled 2015-12-23 (×10): qty 1

## 2015-12-23 MED ORDER — DEXTROSE 5 % IV SOLN
1.0000 g | INTRAVENOUS | Status: DC
  Administered 2015-12-24: 1 g via INTRAVENOUS
  Filled 2015-12-23 (×2): qty 1

## 2015-12-23 MED ORDER — TIOTROPIUM BROMIDE MONOHYDRATE 18 MCG IN CAPS
18.0000 ug | ORAL_CAPSULE | Freq: Every day | RESPIRATORY_TRACT | Status: DC
  Administered 2015-12-24 – 2015-12-25 (×2): 18 ug via RESPIRATORY_TRACT
  Filled 2015-12-23: qty 5

## 2015-12-23 MED ORDER — ALBUTEROL SULFATE (2.5 MG/3ML) 0.083% IN NEBU
2.5000 mg | INHALATION_SOLUTION | RESPIRATORY_TRACT | Status: DC | PRN
  Administered 2015-12-28 – 2015-12-29 (×3): 2.5 mg via RESPIRATORY_TRACT
  Filled 2015-12-23 (×3): qty 3

## 2015-12-23 MED ORDER — ENOXAPARIN SODIUM 40 MG/0.4ML ~~LOC~~ SOLN: 40.0000 mg | SUBCUTANEOUS | Status: DC

## 2015-12-23 MED ORDER — SODIUM CHLORIDE 0.9 % IV SOLN
INTRAVENOUS | Status: DC
  Administered 2015-12-23 (×2): via INTRAVENOUS

## 2015-12-23 MED ORDER — PRAVASTATIN SODIUM 20 MG PO TABS
20.0000 mg | ORAL_TABLET | Freq: Every evening | ORAL | Status: DC
  Administered 2015-12-23 – 2016-01-01 (×10): 20 mg via ORAL
  Filled 2015-12-23 (×9): qty 1

## 2015-12-23 NOTE — H&P (Signed)
History and Physical    Curtis Frazier [id number] DOB: [birth year] DOA: 12/22/2015  Referring MD/NP/PA:   PCP: Loura Pardon, MD   Patient coming from:  The patient is coming from home.  At baseline, pt is independent for most of ADL.      Chief Complaint: Cough, shortness of breath and chest pain  HPI: Curtis Frazier is a 75 y.o. male with medical history significant of hyperlipidemia, diabetes mellitus, GERD, CKD-III, dCHF, PAF on coumadin, PMR, RA, CAD (s/p of stent), who presents with cough, shortness of breath and chest pain.  Patient reports that he has been having cough, shortness breath and chest pain for 2 days. His chest pain is located in front chest, constantly, pleuritic, mild. It is aggravated by deep breath. He does not have sputum production. Patient has subjective fever, but no chills. Has nausea, and vomited once yesterday. No abdominal pain or diarrhea. Patient does not have symptoms of UTI or unilateral weakness.  ED Course: pt was found to have WBC 10.8, lactate 0.95, oxygen desaturation to 85% on room air, BNP 136.8, temperature 99.8, no tachycardia, has tachypnea, sodium 129, stable renal function. Chest x-ray showed infiltration over RLL and RUL. Patient is admitted to stepdown bed as inpatient.  Review of Systems:   General: has subjective fever, no chills, no changes in body weight, has poor appetite, has fatigue HEENT: no blurry vision, hearing changes or sore throat Pulm: Has dyspnea, coughing, no wheezing CV: Has chest pain, no palpitations Abd: Has nausea, vomiting, no abdominal pain, diarrhea, constipation GU: no dysuria, burning on urination, increased urinary frequency, hematuria  Ext: no leg edema Neuro: no unilateral weakness, numbness, or tingling, no vision change or hearing loss Skin: no rash MSK: No muscle spasm, no deformity, no limitation of range of movement in spin Heme: No easy bruising.  Travel history: No recent long distant travel.  Allergy:   Allergies  Allergen Reactions  . Ace Inhibitors Other (See Comments)    Causes high K   . Angiotensin Receptor Blockers Other (See Comments)    Causes high K   . Metoprolol Other (See Comments)    May cause elevated K level   . Penicillins Swelling and Rash    Has patient had a PCN reaction causing immediate rash, facial/tongue/throat swelling, SOB or lightheadedness with hypotension: YES Has patient had a PCN reaction causing severe rash involving mucus membranes or skin necrosis: NO Has patient had a PCN reaction that required hospitalization NO Has patient had a PCN reaction occurring within the last 10 years: NO If all of the above answers are "NO", then may proceed with Cephalosporin use. Pt has tolerated cephalosporins in the past   . Tetracycline Swelling and Rash    Past Medical History  Diagnosis Date  . CAD (coronary artery disease)     a. 2010 s/p MI/Cath/PCI ([facility]): LM 10, LAD 30p, 85m, D1 20, D2 30, LCX 30p, 57m (3.5 x 15 Driver BMS), [id number], RCA 70p, 64m; b. 04/2011 Neg MV;  c. 04/2015 Myoview: very mild apical thinning, no ischemia, EF 65%.  . Hyperlipidemia     a. takes pravachol.  . Personal history of colonic polyps   . Actinic keratosis   . Psoriatic arthropathy ([place])   . Other psoriasis   . Scoliosis (and kyphoscoliosis), idiopathic   . GI bleed     a. 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  . Gastritis 12/10  . Hypertensive heart disease     a.  takes Metoprolol daily  . Dyspnea on exertion   . Joint pain   . Chronic back pain     scoliosis/stenosis/radiculopathy,degenerative disc disease  . Psoriasis   . Bruises easily   . Esophageal reflux     takes Omeprazole daily  . History of colonic polyps   . Hemorrhoids   . History of blood transfusion 2010    "don't remember why"  . PMR (polymyalgia rheumatica) (HCC) 01/20/2012    tx by specialist on a long prednisone taper  . Chronic diastolic CHF (congestive heart failure) ([place])     a. 08/2015 Echo: EF  55-60%, mild LVH, no rwma, Gr1 DD, mod AS, mildly dil LA, PASP 50mmHg.  . Moderate aortic stenosis     a. 08/2015 Mod AS, valve area (VTI) - 1.5cm^2, (Vmax) - 1.25cm^2; b. 10/2015 TEE EF 55-60%, mod AS.  Marland Kitchen Paroxysmal atrial fibrillation ([place])     a. 06/2011 Post-op AF-->converted on amio;  b. 07/2015 TEE/unsuccessful DCCV x 2; c. 09/2015 recurrent AF/Flutter-->successful DCCV; d. 09/2015 Recurrent AF-->10/2015 s/p TEE (EF 55-60%, mod AS, mild MR, sev dil LA w/o LAA thrombus, sev dil RA, mild to mod TR) & DCCV (120 J x 1).  . Lumbar stenosis     a. s/p lumbar diskectomy 2017.  . Carotid arterial disease ([place])     a. 10/2015 Carotid US: bilat 1-39% heterogeneous plaque/stenosis. Nl subclavian arteries bilat.  Marland Kitchen STEMI (ST elevation myocardial infarction) ([place]) 02/2010    Archie Endo 05/2009  . Pneumonia 2008; 07/2015  . COPD (chronic obstructive pulmonary disease) ([place]) dx'd 12/10/2015  . Sleep apnea     "had it while in hospital in 07/2015" (12/15/2015)  . Type II diabetes mellitus ([place])   . Rheumatoid arthritis(714.0)     takes Methotrexate 7pills weekly  . Degeneration of intervertebral disc, site unspecified   . Arthritis     "hips" (12/15/2015)  . CKD (chronic kidney disease), stage III     a. 09/2015 AKI in setting of dehydration - seen @ [facility].  . Nephrolithiasis   . Skin cancer     "arms; they freeze then off; cut one off left elbow recently" (12/15/2015)    Past Surgical History  Procedure Laterality Date  . Shoulder open rotator cuff repair Left 2000  . Colonoscopy    . Vasectomy  1979  . Carotid doppler  10/12    0-39% R and 60-79% left   . Cataract extraction w/ intraocular lens  implant, bilateral Bilateral 2012  . Lithotripsy  2009  . Tee without cardioversion N/A 08/06/2015    Procedure: TRANSESOPHAGEAL ECHOCARDIOGRAM (TEE);  Surgeon: Skeet Latch, MD;  Location: [facility];  Service: Cardiovascular;  Laterality: N/A;  . Cardioversion N/A 08/06/2015    Procedure: CARDIOVERSION;   Surgeon: Skeet Latch, MD;  Location: [facility] ENDOSCOPY;  Service: Cardiovascular;  Laterality: N/A;  . Back surgery    . Lumbar laminectomy  07/2015  . Posterior laminectomy thoracic spine      "fixed the discs"  . Skin cancer excision      "arms"  . Tee without cardioversion N/A 09/14/2015    Procedure: TRANSESOPHAGEAL ECHOCARDIOGRAM (TEE);  Surgeon: Jerline Pain, MD;  Location: [place];  Service: Cardiovascular;  Laterality: N/A;  . Cardioversion N/A 09/14/2015    Procedure: CARDIOVERSION;  Surgeon: Jerline Pain, MD;  Location: [place];  Service: Cardiovascular;  Laterality: N/A;  . Tee without cardioversion N/A 11/04/2015    Procedure: TRANSESOPHAGEAL ECHOCARDIOGRAM (TEE);  Surgeon: Sanda Klein, MD;  Location: [facility] ENDOSCOPY;  Service: Cardiovascular;  Laterality: N/A;  . Cardioversion N/A 11/04/2015    Procedure: CARDIOVERSION;  Surgeon: Sanda Klein, MD;  Location: [place];  Service: Cardiovascular;  Laterality: N/A;  . Electrophysiologic study N/A 12/15/2015    Procedure: A-Flutter;  Surgeon: Will Meredith Leeds, MD;  Location: [place] [facility];  Service: Cardiovascular;  Laterality: N/A;  . Coronary angioplasty with stent placement  02/2009    has one stent  . Cardiac catheterization  2010  . Laminectomy thoracic fusion posterior  06/2011    Decompressive laminectomy at L12, L2-3, L4-5; posterior segmental agitation T10-L5 with globus titanium pedicle screws and rods; T10-11, and T11-12, T12-L1, L1-2, L2-3, L3-4, L4-5 posterior lateral arthrodesis with local morselized autograft bone, bone morphogenic protein, Actifuse bone graft extender./notes 07/03/2011    Social History:  reports that he quit smoking about 7 years ago. His smoking use included Cigarettes. He has a 79.5 pack-year smoking history. He has never used smokeless tobacco. He reports that he drinks alcohol. He reports that he does not use illicit drugs.  Family History:  Family History  Problem Relation Age  of Onset  . Coronary artery disease Mother   . Diabetes Mother   . CAD Mother   . Coronary artery disease Sister   . Hypertension Sister   . Kidney failure Sister   . CAD Sister   . Diabetes Brother   . Prostate cancer Brother   . Pancreatic cancer Brother   . Diabetes Brother   . Anesthesia problems Neg Hx   . Hypotension Neg Hx   . Malignant hyperthermia Neg Hx   . Pseudochol deficiency Neg Hx      Prior to Admission medications   Medication Sig Start Date End Date Taking? Authorizing Provider  acetaminophen (TYLENOL) 325 MG tablet Take 650 mg by mouth every 6 (six) hours as needed (Pain scale of 1-4 or temp > 101).    Historical Provider, MD  albuterol (PROVENTIL HFA;VENTOLIN HFA) 108 (90 Base) MCG/ACT inhaler Inhale 2 puffs into the lungs every 6 (six) hours as needed for wheezing or shortness of breath. 11/10/15   Abner Greenspan, MD  amiodarone (PACERONE) 200 MG tablet Take 1 tablet (200 mg total) by mouth 2 (two) times daily. 11/10/15   Rogelia Mire, NP  calcium-vitamin D (OSCAL WITH D) 500-200 MG-UNIT tablet Take 1 tablet by mouth daily with breakfast.    Historical Provider, MD  diltiazem (CARDIZEM CD) 240 MG 24 hr capsule TAKE 1 CAPSULE BY MOUTH DAILY 10/14/15   Abner Greenspan, MD  docusate sodium (COLACE) 100 MG capsule Take 100 mg by mouth 2 (two) times daily. Reported on 09/22/2015    Historical Provider, MD  ferrous sulfate 325 (65 FE) MG tablet Take 1 tablet (325 mg total) by mouth 3 (three) times daily with meals. 10/26/15   Abner Greenspan, MD  glipiZIDE (GLUCOTROL) 10 MG tablet Take 1 tablet (10 mg total) by mouth 2 (two) times daily before a meal. 10/29/15   [place], MD  glucose blood (ONE TOUCH ULTRA TEST) test strip Check blood sugar three times daily and as directed. Dx E11.8 diabetes that is poorly controlled 11/15/15   Abner Greenspan, MD  pantoprazole (PROTONIX) 40 MG tablet Take 1 tablet (40 mg total) by mouth daily. 10/11/15   Tonia Ghent, MD  pravastatin  (PRAVACHOL) 20 MG tablet Take 1 tablet (20 mg total) by mouth every evening. 11/10/15   Rogelia Mire,  NP  predniSONE (DELTASONE) 5 MG tablet Take 1 tablet (5 mg total) by mouth daily with breakfast. 12/03/15   Abner Greenspan, MD  thiamine 100 MG tablet Take 1 tablet (100 mg total) by mouth daily. 10/11/15   Tonia Ghent, MD  tiotropium (SPIRIVA) 18 MCG inhalation capsule Place 1 capsule (18 mcg total) into inhaler and inhale daily. 12/07/15   Jearld Fenton, NP  warfarin (COUMADIN) 2.5 MG tablet TAKE AS DIRECTED BY ANTI-COAGULATION CLINIC. 12/21/15   Abner Greenspan, MD    Physical Exam: Filed Vitals:   12/23/15 0200 12/23/15 0400 12/23/15 0415 12/23/15 0430  BP: 119/77 122/66 131/69 120/75  Pulse: 84 83 83 82  Temp:      TempSrc:      Resp: 28 20 25 30   Height:      Weight:      SpO2: 87% 91% 93% 91%   General: Not in acute distress HEENT:       Eyes: PERRL, EOMI, no scleral icterus.       ENT: No discharge from the ears and nose, no pharynx injection, no tonsillar enlargement.        Neck: No JVD, no bruit, no mass felt. Heme: No neck lymph node enlargement. Cardiac: S1/S2, RRR, No murmurs, No gallops or rubs. Pulm: has rales bilaterally, worse on right side. No wheezing, rhonchi or rubs. Abd: Soft, nondistended, nontender, no rebound pain, no organomegaly, BS present. GU: No hematuria Ext: No pitting leg edema bilaterally. 2+DP/PT pulse bilaterally. Musculoskeletal: No joint deformities, No joint redness or warmth, no limitation of ROM in spin. Skin: No rashes.  Neuro: Alert, oriented X3, cranial nerves II-XII grossly intact, moves all extremities normally. Psych: Patient is not psychotic, no suicidal or hemocidal ideation.  Labs on Admission: I have personally reviewed following labs and imaging studies  CBC:  Recent Labs Lab 12/22/15 1747  WBC 10.8*  NEUTROABS 8.1*  HGB 12.4*  HCT 38.9*  MCV 91.3  PLT [id number]   Basic Metabolic Panel:  Recent Labs Lab  12/22/15 1747  NA 129*  K 4.7  CL 97*  CO2 23  GLUCOSE 278*  BUN 26*  CREATININE 1.62*  CALCIUM 9.2   GFR: Estimated Creatinine Clearance: 37.5 mL/min (by C-G formula based on Cr of 1.62). Liver Function Tests: No results for input(s): AST, ALT, ALKPHOS, BILITOT, PROT, ALBUMIN in the last 168 hours. No results for input(s): LIPASE, AMYLASE in the last 168 hours. No results for input(s): AMMONIA in the last 168 hours. Coagulation Profile:  Recent Labs Lab 12/22/15 2354 12/23/15 0434  INR 2.29* 2.18*   Cardiac Enzymes: No results for input(s): CKTOTAL, CKMB, CKMBINDEX, TROPONINI in the last 168 hours. BNP (last 3 results) No results for input(s): PROBNP in the last 8760 hours. HbA1C: No results for input(s): HGBA1C in the last 72 hours. CBG:  Recent Labs Lab 12/16/15 0731 12/23/15 0159  GLUCAP 105* 186*   Lipid Profile: No results for input(s): CHOL, HDL, LDLCALC, TRIG, CHOLHDL, LDLDIRECT in the last 72 hours. Thyroid Function Tests: No results for input(s): TSH, T4TOTAL, FREET4, T3FREE, THYROIDAB in the last 72 hours. Anemia Panel: No results for input(s): VITAMINB12, FOLATE, FERRITIN, TIBC, IRON, RETICCTPCT in the last 72 hours. Urine analysis:    Component Value Date/Time   COLORURINE YELLOW 09/07/2015 1720   APPEARANCEUR CLEAR 09/07/2015 1720   LABSPEC 1.011 09/07/2015 1720   PHURINE 5.5 09/07/2015 1720   GLUCOSEU NEGATIVE 09/07/2015 1720   HGBUR NEGATIVE 09/07/2015 1720  HGBUR negative 08/03/2010 0756   BILIRUBINUR NEGATIVE 09/07/2015 1720   BILIRUBINUR negative 09/25/2014 1305   KETONESUR NEGATIVE 09/07/2015 1720   PROTEINUR NEGATIVE 09/07/2015 1720   PROTEINUR 1+ 09/25/2014 1305   UROBILINOGEN negative 09/25/2014 1305   UROBILINOGEN 2.0* 07/11/2011 2021   NITRITE NEGATIVE 09/07/2015 1720   NITRITE negative 09/25/2014 1305   LEUKOCYTESUR NEGATIVE 09/07/2015 1720   Sepsis Labs: @LABRCNTIP (procalcitonin:4,lacticidven:4) )No results found for this  or any previous visit (from the past 240 hour(s)).   Radiological Exams on Admission: Dg Chest 2 View  12/22/2015  CLINICAL DATA:  Cough and congestion for 2 days. Nausea. Question pneumonia. EXAM: CHEST  2 VIEW COMPARISON:  Two-view chest x-ray 11/10/2015. FINDINGS: The heart is mildly enlarged. There is slight increase in a diffuse interstitial pattern suggesting edema superimposed on chronic interstitial coarsening. Superior segment right lower lobe and right upper lobe airspace disease is present. Remote compression fractures are again noted in the mid thoracic spine. Thoracolumbar fusion is noted. IMPRESSION: 1. Superior segment right lower lobe and right upper lobe airspace disease concerning for pneumonia. 2. Mild generalized edema superimposed on chronic interstitial disease. Electronically Signed   By: San Morelle M.D.   On: 12/22/2015 18:36     EKG: Independently reviewed.  Not done in ED, will get one.   Assessment/Plan Principal Problem:   HCAP (healthcare-associated pneumonia) Active Problems:   Coronary atherosclerosis   Rheumatoid arthritis (HCC)   PMR (polymyalgia rheumatica) (HCC)   Back pain without radiation   Chronic diastolic congestive heart failure (HCC)   Paroxysmal atrial fibrillation (HCC)   Diabetes mellitus with complication (HCC)   Kidney disease, chronic, stage III (GFR 30-59 ml/min)   Hyperlipidemia   Hyponatremia   GERD (gastroesophageal reflux disease)   HCAP (healthcare-associated pneumonia): Patient's cough, shortness breath, chest pain and chest x-ray findings are consistent with HCAP. Patient is not septic on admission. Lactate is normal. Hemodynamically stable.  - Will be admitted to SDU as inpt - IV Vancomycin and cefepime - Mucinex for cough  - prn Albuterol Nebs for SOB - Urine legionella and S. pneumococcal antigen - Follow up blood culture x2, sputum culture and respiratory virus panel - will get Procalcitonin and trend lactic  acid level - IVF: 1L of NS bolus in ED, followed by 75 mL per hour of NS (patient has diastolic congestive heart failure, limiting aggressive IV fluids treatment)  Hx of RA and PMR: stable -continue home prednisone, 5 mg daily -give stress dose Solu-Cortef 50 mg 1 -Check cortisol level  Chronic diastolic congestive heart failure: 2-D echo on 09/06/15 showed EF 55-60 percent with grade 1 diastolic dysfunction. Patient is not taking diuretics at home. BNP is 136.8. No leg edema or JVD. CHF is compensated -Watch volume status closely  Coronary atherosclerosis: Patient has pleuritic chest pain, which is most likely due to pneumonia. -continue lipitor for HLD management  HLD: Last LDL was 90 on 12/10/15 -Continue home medications: Lipitor  Back pain without radiation: -prn Tylenol  COPD: No wheezing or rhonchi on auscultation. Does not seem to have a acute exacerbation. -When necessary albuterol and measures -Continue Spiriva inhaler  DM-II: Last A1c 9.2 on 10/25/15, poorly controled. Patient is taking glipizide at home -SSI  CDK-III: stable. Based entering a 1.4-1.7. His creatinine is 1.62, BUN 26. -Follow-up renal function by BMP  Paroxysmal atrial fibrillation ([place]): CHA2DS2-VASc Score is 5, needs oral anticoagulation. Patient is on Coumadin at home. INR is 2./29 on admission. Heart rate is well controlled -  continue coumadin  -continue amiodarone, Cardizem -tele monitorng  GERD: -Protonix  Hyponatremia: Sodium 129. Mental status is normal. Likely due to decreased oral intake. -Check Osmo of plasma and urine, and urine sodium level -check TSH -IV normal saline as above  DVT ppx: On coumadin Code Status: Full code Family Communication:  Yes, patient's daughter and granddaughter   at bed side Disposition Plan:  Anticipate discharge back to previous home environment Consults called:   Admission status: Obs / tele  Inpatient/tele   medical floor/obs     SDU/inpation        Date of Service 12/23/2015    Ivor Costa [facility] Pager [phone]  If 7PM-7AM, please contact night-coverage [email or web address] Password [facility] 12/23/2015, 6:04 AM

## 2015-12-23 NOTE — Progress Notes (Signed)
Inpatient Diabetes Program Recommendations  AACE/ADA: New Consensus Statement on Inpatient Glycemic Control (2015)  Target Ranges:  Prepandial:   less than 140 mg/dL      Peak postprandial:   less than 180 mg/dL (1-2 hours)      Critically ill patients:  140 - 180 mg/dL   Results for Curtis Frazier, Curtis Frazier (MRN [id number]) as of 12/23/2015 09:18  Ref. Range 12/23/2015 01:59 12/23/2015 07:55  Glucose-Capillary Latest Ref Range: 65-99 mg/dL 186 (H) 266 (H)   Review of Glycemic Control  Diabetes history: DM2 Outpatient Diabetes medications: Glipizide 10 mg BID Current orders for Inpatient glycemic control: Novolog 0-9 units TID with meals  Inpatient Diabetes Program Recommendations: Insulin - Basal: May want to consider ordering low dose basal insulin. Would recommend starting with Levemir 5 units daily.  Thanks, Barnie Alderman, RN, MSN, CDE Diabetes Coordinator Inpatient Diabetes Program [phone] (Team Pager from [place] to [place]) [phone] ([facility] office) [phone] [facility] office) [phone] [facility] office)

## 2015-12-23 NOTE — Evaluation (Signed)
Physical Therapy Evaluation Patient Details Name: Curtis Frazier MRN: [id number] DOB: 1940/12/22 Today's Date: 12/23/2015   History of Present Illness  Pt adm with HCAP. PMH - CAD, DM, MI, CHF, GI bleed, HTN, afib  Clinical Impression  Pt admitted with above diagnosis and presents to PT with functional limitations due to deficits listed below (See PT problem list). Pt needs skilled PT to maximize independence and safety to allow discharge to home with family. Pt moving fairly well and not far from baseline. Will follow acutely but doubt will need any PT after DC.      Follow Up Recommendations No PT follow up    Equipment Recommendations  None recommended by PT    Recommendations for Other Services       Precautions / Restrictions Precautions Precautions: Fall      Mobility  Bed Mobility Overal bed mobility: Modified Independent             General bed mobility comments: Incr time  Transfers Overall transfer level: Needs assistance Equipment used: 4-wheeled walker Transfers: Sit to/from Stand Sit to Stand: Min guard         General transfer comment: Assist for safety  Ambulation/Gait Ambulation/Gait assistance: Min guard;Supervision Ambulation Distance (Feet): 310 Feet Assistive device: 4-wheeled walker Gait Pattern/deviations: Step-through pattern;Decreased stride length Gait velocity: decr Gait velocity interpretation: <1.8 ft/sec, indicative of risk for recurrent falls General Gait Details: Initially slight unsteadiness requiring min Guard but improved stability with incr distance and only required supervision. Pt on 5L of O2 at rest with SpO2 92%. Amb on 6L of O2 with dyspnea 2/4 and SpO2 92%.  Stairs            Wheelchair Mobility    Modified Rankin (Stroke Patients Only)       Balance Overall balance assessment: Needs assistance Sitting-balance support: No upper extremity supported;Feet supported Sitting balance-Leahy Scale: Good      Standing balance support: No upper extremity supported Standing balance-Leahy Scale: Fair                               Pertinent Vitals/Pain Pain Assessment: No/denies pain    Home Living Family/patient expects to be discharged to:: Private residence Living Arrangements: Spouse/significant other Available Help at Discharge: Family;Available 24 hours/day Type of Home: House Home Access: Stairs to enter Entrance Stairs-Rails: None Entrance Stairs-Number of Steps: 1 from garage Home Layout: One level Home Equipment: Environmental consultant - 2 wheels;Bedside commode;Shower seat;Hand held shower head      Prior Function Level of Independence: Independent with assistive device(s)         Comments: Uses RW     Hand Dominance   Dominant Hand: Right    Extremity/Trunk Assessment   Upper Extremity Assessment: Defer to OT evaluation           Lower Extremity Assessment: Generalized weakness         Communication   Communication: No difficulties  Cognition Arousal/Alertness: Awake/alert Behavior During Therapy: WFL for tasks assessed/performed Overall Cognitive Status: Within Functional Limits for tasks assessed                      General Comments      Exercises        Assessment/Plan    PT Assessment Patient needs continued PT services  PT Diagnosis Difficulty walking;Generalized weakness   PT Problem List Decreased strength;Decreased activity tolerance;Decreased balance;Decreased mobility;Cardiopulmonary status  limiting activity  PT Treatment Interventions DME instruction;Gait training;Functional mobility training;Therapeutic activities;Therapeutic exercise;Balance training;Patient/family education   PT Goals (Current goals can be found in the Care Plan section) Acute Rehab PT Goals Patient Stated Goal: return home PT Goal Formulation: With patient Time For Goal Achievement: 12/30/15 Potential to Achieve Goals: Good    Frequency Min 3X/week    Barriers to discharge        Co-evaluation               End of Session Equipment Utilized During Treatment: Gait belt;Oxygen Activity Tolerance: Patient tolerated treatment well Patient left: in chair;with call bell/phone within reach Nurse Communication: Mobility status         Time: [id number] PT Time Calculation (min) (ACUTE ONLY): 14 min   Charges:   PT Evaluation $PT Eval Low Complexity: 1 Procedure     PT G Codes:        Kha Hari 01-09-2016, 1:09 PM [employer] PT [phone]

## 2015-12-23 NOTE — Progress Notes (Signed)
ANTICOAGULATION/ANTIBIOTIC CONSULT NOTE - Initial Consult  Pharmacy Consult for Coumadin, Cefepime, and Vancomycin Indication: afib, HCAP  Allergies  Allergen Reactions  . Ace Inhibitors Other (See Comments)    Causes high K   . Angiotensin Receptor Blockers Other (See Comments)    Causes high K   . Metoprolol Other (See Comments)    May cause elevated K level   . Penicillins Swelling and Rash    Has patient had a PCN reaction causing immediate rash, facial/tongue/throat swelling, SOB or lightheadedness with hypotension: YES Has patient had a PCN reaction causing severe rash involving mucus membranes or skin necrosis: NO Has patient had a PCN reaction that required hospitalization NO Has patient had a PCN reaction occurring within the last 10 years: NO If all of the above answers are "NO", then may proceed with Cephalosporin use. Pt has tolerated cephalosporins in the past   . Tetracycline Swelling and Rash    Patient Measurements: Height: 5\' 8"  (172.7 cm) Weight: 164 lb (74.39 kg) IBW/kg (Calculated) : 68.4  Vital Signs: Temp: 99.1 F (37.3 C) (07/12 2143) Temp Source: Oral (07/12 2143) BP: 130/69 mmHg (07/13 0030) Pulse Rate: 78 (07/13 0030)  Labs:  Recent Labs  12/22/15 1747 12/22/15 2354  HGB 12.4*  --   HCT 38.9*  --   PLT 277  --   LABPROT  --  25.0*  INR  --  2.29*  CREATININE 1.62*  --     Temp (24hrs), Avg:99.4 F (37.4 C), Min:99.1 F (37.3 C), Max:99.8 F (37.7 C)   Recent Labs Lab 12/22/15 1747 12/22/15 2309  WBC 10.8*  --   CREATININE 1.62*  --   LATICACIDVEN  --  0.95    Estimated Creatinine Clearance: 37.5 mL/min (by C-G formula based on Cr of 1.62).     Medical History: Past Medical History  Diagnosis Date  . CAD (coronary artery disease)     a. 2010 s/p MI/Cath/PCI ([facility]): LM 10, LAD 30p, 25m, D1 20, D2 30, LCX 30p, 64m (3.5 x 15 Driver BMS), [id number], RCA 70p, 85m; b. 04/2011 Neg MV;  c. 04/2015 Myoview: very mild apical thinning,  no ischemia, EF 65%.  . Hyperlipidemia     a. takes pravachol.  . Personal history of colonic polyps   . Actinic keratosis   . Psoriatic arthropathy ([place])   . Other psoriasis   . Scoliosis (and kyphoscoliosis), idiopathic   . GI bleed     a. 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  . Gastritis 12/10  . Hypertensive heart disease     a. takes Metoprolol daily  . Dyspnea on exertion   . Joint pain   . Chronic back pain     scoliosis/stenosis/radiculopathy,degenerative disc disease  . Psoriasis   . Bruises easily   . Esophageal reflux     takes Omeprazole daily  . History of colonic polyps   . Hemorrhoids   . History of blood transfusion 2010    "don't remember why"  . PMR (polymyalgia rheumatica) (HCC) 01/20/2012    tx by specialist on a long prednisone taper  . Chronic diastolic CHF (congestive heart failure) ([place])     a. 08/2015 Echo: EF 55-60%, mild LVH, no rwma, Gr1 DD, mod AS, mildly dil LA, PASP 66mmHg.  . Moderate aortic stenosis     a. 08/2015 Mod AS, valve area (VTI) - 1.5cm^2, (Vmax) - 1.25cm^2; b. 10/2015 TEE EF 55-60%, mod AS.  Marland Kitchen Paroxysmal atrial fibrillation (HCC)  a. 06/2011 Post-op AF-->converted on amio;  b. 07/2015 TEE/unsuccessful DCCV x 2; c. 09/2015 recurrent AF/Flutter-->successful DCCV; d. 09/2015 Recurrent AF-->10/2015 s/p TEE (EF 55-60%, mod AS, mild MR, sev dil LA w/o LAA thrombus, sev dil RA, mild to mod TR) & DCCV (120 J x 1).  . Lumbar stenosis     a. s/p lumbar diskectomy 2017.  . Carotid arterial disease ([place])     a. 10/2015 Carotid US: bilat 1-39% heterogeneous plaque/stenosis. Nl subclavian arteries bilat.  Marland Kitchen STEMI (ST elevation myocardial infarction) ([place]) 02/2010    Archie Endo 05/2009  . Pneumonia 2008; 07/2015  . COPD (chronic obstructive pulmonary disease) ([place]) dx'd 12/10/2015  . Sleep apnea     "had it while in hospital in 07/2015" (12/15/2015)  . Type II diabetes mellitus ([place])   . Rheumatoid arthritis(714.0)     takes Methotrexate 7pills weekly  .  Degeneration of intervertebral disc, site unspecified   . Arthritis     "hips" (12/15/2015)  . CKD (chronic kidney disease), stage III     a. 09/2015 AKI in setting of dehydration - seen @ [facility].  . Nephrolithiasis   . Skin cancer     "arms; they freeze then off; cut one off left elbow recently" (12/15/2015)    Medications:  See electronic med rec  Assessment: 75 y.o. M presents with cough/congestion.   Anticoagulation: Coumadin PTA for afib. INR therapeutic on admission. Home dose: 5mg  M/W/F and 2.5mg  other days - last dose 7/11  Infectious Disease: Vancomycin and Cefepime for HCAP. Pt with PCN allergy listed as swelling but has tolerated cephalosporins many times in the past and also tolerated first dose of Cefepime in ED.  7/13 Vanc >>  7/13 Cefepime >>   7/12 BCx x2:   Sputum:   Resp panel:   Goal of Therapy:  INR 2-3 Monitor platelets by anticoagulation protocol: Yes  Vancomycin trough 15-20 mcg/ml   Plan:  Daily INR Coumadin 5mg  M/W/F and 2.5mg  other days Cefepime 1gm IV q24h Vancomycin 1gm IV q24h Will f/u micro data, pt's clinical condition, and renal function Vanc trough prn  Sherlon Handing, PharmD, BCPS Clinical pharmacist, pager [phone] 12/23/2015,1:49 AM

## 2015-12-23 NOTE — Progress Notes (Signed)
Report called to [facility] RN, and pt transferred via wheelchair, on O2 via nasal cannula, and on telemetry monitoring by RN and NT.

## 2015-12-23 NOTE — Care Management Note (Signed)
Case Management Note  Patient Details  Name: Curtis Frazier MRN: [id number] Date of Birth: Nov 16, 1940  Subjective/Objective: Pt admitted with PNA                   Action/Plan:  PTA independent from home with wife.  CM will continue to follow for discharge needs   Expected Discharge Date:                  Expected Discharge Plan:  Home/Self Care  In-House Referral:     Discharge planning Services  CM Consult  Post Acute Care Choice:    Choice offered to:     DME Arranged:    DME Agency:     HH Arranged:    HH Agency:     Status of Service:  In process, will continue to follow  If discussed at Long Length of Stay Meetings, dates discussed:    Additional Comments:  Maryclare Labrador, RN 12/23/2015, 3:39 PM

## 2015-12-23 NOTE — Progress Notes (Signed)
PROGRESS NOTE    Curtis Frazier  [id number] DOB: 1940-10-05 DOA: 12/22/2015 PCP: Loura Pardon, MD    Brief Narrative:  Patient is a 61 year gentleman history of hyperlipidemia, diabetes, chronic kidney disease stage III, diastolic CHF, paroxysmal atrial fibrillation on Coumadin/atrial flutter status post recent CTI ablation per cardiology 12/15/2015 who presents to the ED with a 2 day history of cough, shortness of breath, chest pain pleuritic in nature with subjective fever and noted per chest x-ray to have a right middle and right lower lobe pneumonia. Patient's blood pressure noted to be borderline. Patient started empirically on IV vancomycin IV cefepime for healthcare associated pneumonia.   Assessment & Plan:   Principal Problem:   HCAP (healthcare-associated pneumonia) Active Problems:   Coronary atherosclerosis   Rheumatoid arthritis ([place])   PMR (polymyalgia rheumatica) (HCC)   Back pain without radiation   Chronic diastolic congestive heart failure (HCC)   Paroxysmal atrial fibrillation (HCC)   Diabetes mellitus with complication (HCC)   Kidney disease, chronic, stage III (GFR 30-59 ml/min)   Hyperlipidemia   Hyponatremia   GERD (gastroesophageal reflux disease)   #1 healthcare associated pneumonia Patient presented with subjective fever, shortness of breath, cough with chest x-ray findings consistent with a healthcare associated pneumonia in the right middle and right lower lobes. Titrate O2. Sputum Gram stain and culture pending. Urine Legionella antigen pending. Urine pneumococcus antigen pending. Continue empiric IV vancomycin, IV cefepime. Increase Mucinex DM to 2 tablets twice daily. Will add Pulmicort. Follow.  #2 diabetes mellitus Hemoglobin A1c was 9.2 on 10/25/2015. Patient on chronic steroids. CBG has ranged from 180-266. Will start low-dose Levemir 5 mg daily. Continue sliding scale insulin.  #3 history of rheumatoid arthritis/PMR Stable. Patient was given a  one-time dose of Solu-Cortef 50 minute grams IV 1. Continue prednisone 5 mg daily.  #4 chronic diastolic CHF/coronary artery disease 2-D echo from 09/06/2015 with a EF of 55-60% with grade 1 diastolic dysfunction. Patient not on diuretics at home. Patient with no JVD and no lower extremity edema. Compensated. Monitor volume status closely.  #4 hyperlipidemia Continue statin.  #5 history of atrial flutter status post CTI ablation 12/15/2015/paroxysmal atrial fibrillation CHA2DS2VASC SCORE 5 Patient currently normal sinus rhythm with occasional PVCs. Patient asymptomatic. Continue diltiazem, amiodarone, Coumadin. INR is 2.18. Coumadin per pharmacy. Outpatient follow-up with cardiology.  #6 gastroesophageal reflux disease PPI.  #7 pleuritic chest pain Secondary to problem #1. Improving.  #8 COPD Stable. No wheezing noted. Continue Spiriva. Continue albuterol nebulizers as needed. Will place on Pulmicort.  #9 chronic kidney disease stage III Stable.  #10 hyponatremia TSH within normal limits. Serum osmolalities 288. Urine osmolality is 461. Urine sodium of 43. Cortisol level at 53.9. Labs pending.   DVT prophylaxis: Coumadin Code Status: Full Family Communication: Updated patient. No family at bedside. Disposition Plan: Remain in step down unit.   Consultants:   None  Procedures:   Chest x-ray 12/22/2015  Antimicrobials:   IV vancomycin 12/22/2015  IV cefepime 12/22/2015   Subjective: Patient states cough improving. SOB improving. CP improving.   Objective: Filed Vitals:   12/23/15 0615 12/23/15 0630 12/23/15 0700 12/23/15 0800  BP: 136/70 133/72 110/65 116/89  Pulse: 80 82 83 83  Temp:   98.2 F (36.8 C)   TempSrc:   Oral   Resp: 19 21 37 21  Height:      Weight:      SpO2: 92%  91% 96%    Intake/Output Summary (Last 24 hours) at  12/23/15 1020 Last data filed at 12/23/15 0800  Gross per 24 hour  Intake    135 ml  Output    300 ml  Net   -165 ml    Filed Weights   12/22/15 1729  Weight: 74.39 kg (164 lb)    Examination:  General exam: Appears calm and comfortable  Respiratory system: Clear to auscultation. Respiratory effort normal. Cardiovascular system: S1 & S2 heard, RRR. No JVD, murmurs, rubs, gallops or clicks. No pedal edema. Gastrointestinal system: Abdomen is nondistended, soft and nontender. No organomegaly or masses felt. Normal bowel sounds heard. Central nervous system: Alert and oriented. No focal neurological deficits. Extremities: Symmetric 5 x 5 power. Skin: No rashes, lesions or ulcers.Right groin area with a bruise however no thrill noted, nontender to palpation, no warmth. Psychiatry: Judgement and insight appear normal. Mood & affect appropriate.     Data Reviewed: I have personally reviewed following labs and imaging studies  CBC:  Recent Labs Lab 12/22/15 1747  WBC 10.8*  NEUTROABS 8.1*  HGB 12.4*  HCT 38.9*  MCV 91.3  PLT 99991111   Basic Metabolic Panel:  Recent Labs Lab 12/22/15 1747  NA 129*  K 4.7  CL 97*  CO2 23  GLUCOSE 278*  BUN 26*  CREATININE 1.62*  CALCIUM 9.2   GFR: Estimated Creatinine Clearance: 37.5 mL/min (by C-G formula based on Cr of 1.62). Liver Function Tests: No results for input(s): AST, ALT, ALKPHOS, BILITOT, PROT, ALBUMIN in the last 168 hours. No results for input(s): LIPASE, AMYLASE in the last 168 hours. No results for input(s): AMMONIA in the last 168 hours. Coagulation Profile:  Recent Labs Lab 12/22/15 2354 12/23/15 0434  INR 2.29* 2.18*   Cardiac Enzymes: No results for input(s): CKTOTAL, CKMB, CKMBINDEX, TROPONINI in the last 168 hours. BNP (last 3 results) No results for input(s): PROBNP in the last 8760 hours. HbA1C: No results for input(s): HGBA1C in the last 72 hours. CBG:  Recent Labs Lab 12/23/15 0159 12/23/15 0755  GLUCAP 186* 266*   Lipid Profile: No results for input(s): CHOL, HDL, LDLCALC, TRIG, CHOLHDL, LDLDIRECT in the  last 72 hours. Thyroid Function Tests:  Recent Labs  12/23/15 0434  TSH 0.660   Anemia Panel: No results for input(s): VITAMINB12, FOLATE, FERRITIN, TIBC, IRON, RETICCTPCT in the last 72 hours. Sepsis Labs:  Recent Labs Lab 12/22/15 2309  LATICACIDVEN 0.95    Recent Results (from the past 240 hour(s))  MRSA PCR Screening     Status: None   Collection Time: 12/23/15  7:25 AM  Result Value Ref Range Status   MRSA by PCR NEGATIVE NEGATIVE Final    Comment:        The GeneXpert MRSA Assay (FDA approved for NASAL specimens only), is one component of a comprehensive MRSA colonization surveillance program. It is not intended to diagnose MRSA infection nor to guide or monitor treatment for MRSA infections.          Radiology Studies: Dg Chest 2 View  12/22/2015  CLINICAL DATA:  Cough and congestion for 2 days. Nausea. Question pneumonia. EXAM: CHEST  2 VIEW COMPARISON:  Two-view chest x-ray 11/10/2015. FINDINGS: The heart is mildly enlarged. There is slight increase in a diffuse interstitial pattern suggesting edema superimposed on chronic interstitial coarsening. Superior segment right lower lobe and right upper lobe airspace disease is present. Remote compression fractures are again noted in the mid thoracic spine. Thoracolumbar fusion is noted. IMPRESSION: 1. Superior segment right lower lobe and right  upper lobe airspace disease concerning for pneumonia. 2. Mild generalized edema superimposed on chronic interstitial disease. Electronically Signed   By: San Morelle M.D.   On: 12/22/2015 18:36        Scheduled Meds: . amiodarone  200 mg Oral BID  . calcium-vitamin D  1 tablet Oral Q breakfast  . ceFEPime (MAXIPIME) IV  1 g Intravenous Q24H  . dextromethorphan-guaiFENesin  1 tablet Oral BID  . diltiazem  240 mg Oral Daily  . docusate sodium  100 mg Oral BID  . ferrous sulfate  325 mg Oral TID WC  . insulin aspart  0-5 Units Subcutaneous QHS  . insulin  aspart  0-9 Units Subcutaneous TID WC  . pantoprazole  40 mg Oral Daily  . pravastatin  20 mg Oral QPM  . predniSONE  5 mg Oral Q breakfast  . thiamine  100 mg Oral Daily  . tiotropium  18 mcg Inhalation Daily  . vancomycin  1,000 mg Intravenous Q24H  . warfarin  2.5 mg Oral Q T,Th,S,Su-1800  . [START ON 12/24/2015] warfarin  5 mg Oral Q M,W,F-1800  . Warfarin - Pharmacist Dosing Inpatient   Does not apply q1800   Continuous Infusions: . sodium chloride 75 mL/hr at 12/23/15 0800     LOS: 0 days    Time spent: 71 mins    THOMPSON,DANIEL, MD [facility] Pager [phone]  If 7PM-7AM, please contact night-coverage [email or web address] Password [email or web address] 12/23/2015, 10:20 AM

## 2015-12-23 NOTE — ED Notes (Signed)
RN called and informed there was no urine sample in lab; pt attempting to provide urine sample at this time

## 2015-12-24 ENCOUNTER — Inpatient Hospital Stay (HOSPITAL_COMMUNITY): Payer: Medicare Other

## 2015-12-24 DIAGNOSIS — M069 Rheumatoid arthritis, unspecified: Secondary | ICD-10-CM

## 2015-12-24 DIAGNOSIS — R0902 Hypoxemia: Secondary | ICD-10-CM

## 2015-12-24 LAB — BASIC METABOLIC PANEL
ANION GAP: 9 (ref 5–15)
BUN: 18 mg/dL (ref 6–20)
CALCIUM: 8.9 mg/dL (ref 8.9–10.3)
CHLORIDE: 101 mmol/L (ref 101–111)
CO2: 23 mmol/L (ref 22–32)
CREATININE: 1.19 mg/dL (ref 0.61–1.24)
GFR calc Af Amer: >60 mL/min (ref >60)
GFR calc non Af Amer: 58 mL/min — ABNORMAL LOW (ref >60)
GLUCOSE: 112 mg/dL — AB (ref 65–99)
Potassium: 4.2 mmol/L (ref 3.5–5.1)
Sodium: 133 mmol/L — ABNORMAL LOW (ref 135–145)

## 2015-12-24 LAB — CBC
HEMATOCRIT: 35.4 % — AB (ref 39.0–52.0)
HEMOGLOBIN: 11.6 g/dL — AB (ref 13.0–17.0)
MCH: 29.6 pg (ref 26.0–34.0)
MCHC: 32.8 g/dL (ref 30.0–36.0)
MCV: 90.3 fL (ref 78.0–100.0)
Platelets: 285 10*3/uL (ref 150–400)
RBC: 3.92 MIL/uL — ABNORMAL LOW (ref 4.22–5.81)
RDW: 17.3 % — AB (ref 11.5–15.5)
WBC: 12.7 10*3/uL — ABNORMAL HIGH (ref 4.0–10.5)

## 2015-12-24 LAB — GLUCOSE, CAPILLARY
GLUCOSE-CAPILLARY: 246 mg/dL — AB (ref 65–99)
GLUCOSE-CAPILLARY: 147 mg/dL — AB (ref 65–99)
GLUCOSE-CAPILLARY: 180 mg/dL — AB (ref 65–99)
GLUCOSE-CAPILLARY: 205 mg/dL — AB (ref 65–99)
Glucose-Capillary: 133 mg/dL — ABNORMAL HIGH (ref 65–99)

## 2015-12-24 LAB — PROTIME-INR
INR: 1.91 — ABNORMAL HIGH (ref 0.00–1.49)
Prothrombin Time: 21.8 seconds — ABNORMAL HIGH (ref 11.6–15.2)

## 2015-12-24 MED ORDER — FUROSEMIDE 10 MG/ML IJ SOLN
40.0000 mg | Freq: Once | INTRAMUSCULAR | Status: AC
  Administered 2015-12-24: 40 mg via INTRAVENOUS
  Filled 2015-12-24: qty 4

## 2015-12-24 MED ORDER — DEXTROSE 5 % IV SOLN
1.0000 g | Freq: Three times a day (TID) | INTRAVENOUS | Status: DC
  Administered 2015-12-24 – 2015-12-25 (×3): 1 g via INTRAVENOUS
  Filled 2015-12-24 (×5): qty 1

## 2015-12-24 MED ORDER — VANCOMYCIN HCL IN DEXTROSE 750-5 MG/150ML-% IV SOLN
750.0000 mg | Freq: Two times a day (BID) | INTRAVENOUS | Status: DC
  Administered 2015-12-24 – 2015-12-25 (×4): 750 mg via INTRAVENOUS
  Filled 2015-12-24 (×5): qty 150

## 2015-12-24 NOTE — Progress Notes (Signed)
Evaluated pt before ABG.  Telemetry showed poor waveform for SO2.  Changed probe location to left ear.  Reduced O2 level to 2 liters.  Pt sats between 96 - 98 w/good waveform.  Will continue to monitor and advise night RT of need to correlate good waveform with O2 sats.

## 2015-12-24 NOTE — Progress Notes (Signed)
RT called by RN due to patient desat.  Patient noted to be in NAD upon arrival to room.  Pt denies respiratory complaint. SpO2 83-84% on 5L [place].  Venti mask applied and titrated to SpO2 88% per MD notes (88-94%).   Flutter valve also introduced d/t possible worsening pna.

## 2015-12-24 NOTE — Evaluation (Signed)
Occupational Therapy Evaluation Patient Details Name: Curtis Frazier MRN: [id number] DOB: June 03, 1941 Today's Date: 12/24/2015    History of Present Illness Pt adm with HCAP. PMH - CAD, DM, MI, CHF, GI bleed, HTN, afib.  Pt with prolonged hospitalization 2/17 with VDRF due to sepsis, PNA and encephalopathy.     Clinical Impression   Pt admitted with above. He demonstrates the below listed deficits and will benefit from continued OT to maximize safety and independence with BADLs.  Pt presents to OT with generalized weakness and decreased activity tolerance.  He requires min guard assist for ADLs, but sats decreased to 79% and DOE 3/4 on 6L supplemental 02.  He was instructed in pursed lip breathing and sats increased to mid 90s with activity.  Will follow       Follow Up Recommendations  No OT follow up;Supervision - Intermittent    Equipment Recommendations  None recommended by OT    Recommendations for Other Services       Precautions / Restrictions Precautions Precautions: Fall      Mobility Bed Mobility Overal bed mobility: Modified Independent                Transfers Overall transfer level: Needs assistance Equipment used: Rolling walker (2 wheeled) Transfers: Sit to/from [facility] Sit to Stand: Min guard Stand pivot transfers: Min guard       General transfer comment: min guard for safety     Balance Overall balance assessment: Needs assistance Sitting-balance support: Feet supported Sitting balance-Leahy Scale: Good     Standing balance support: No upper extremity supported Standing balance-Leahy Scale: Fair                              ADL Overall ADL's : Needs assistance/impaired Eating/Feeding: Independent   Grooming: Wash/dry hands;Wash/dry face;Oral care;Brushing hair;Min guard;Standing   Upper Body Bathing: Set up;Sitting   Lower Body Bathing: Min guard;Sit to/from stand   Upper Body Dressing : Set  up;Sitting   Lower Body Dressing: Min guard;Sit to/from stand   Toilet Transfer: Min guard;Ambulation;BSC;RW   Toileting- Water quality scientist and Hygiene: Min guard;Sit to/from stand       Functional mobility during ADLs: Herbalist     Praxis      Pertinent Vitals/Pain Pain Assessment: No/denies pain     Hand Dominance Right   Extremity/Trunk Assessment Upper Extremity Assessment Upper Extremity Assessment: Generalized weakness   Lower Extremity Assessment Lower Extremity Assessment: Defer to PT evaluation   Cervical / Trunk Assessment Cervical / Trunk Assessment: Normal   Communication Communication Communication: No difficulties   Cognition Arousal/Alertness: Awake/alert Behavior During Therapy: WFL for tasks assessed/performed Overall Cognitive Status: Within Functional Limits for tasks assessed                     General Comments       Exercises       Shoulder Instructions      Home Living Family/patient expects to be discharged to:: Private residence Living Arrangements: Spouse/significant other Available Help at Discharge: Family;Available 24 hours/day Type of Home: House Home Access: Stairs to enter [employer] of Steps: 1 from garage Entrance Stairs-Rails: None Home Layout: One level     Bathroom Shower/Tub: Walk-in shower;Door   Bathroom Toilet: Handicapped height Bathroom Accessibility: Yes   Home Equipment: Environmental consultant - 2 wheels;Bedside commode;Shower  seat;Hand held shower head          Prior Functioning/Environment Level of Independence: Independent with assistive device(s)        Comments: Uses RW.  Pt reports he ambulates in grocery store, but hasn't been able to manage yard maintenance this year     OT Diagnosis: Generalized weakness   OT Problem List: Decreased strength;Decreased activity tolerance;Impaired balance (sitting and/or standing);Cardiopulmonary  status limiting activity   OT Treatment/Interventions: Self-care/ADL training;DME and/or AE instruction;Energy conservation;Therapeutic activities;Patient/family education;Balance training    OT Goals(Current goals can be found in the care plan section) Acute Rehab OT Goals Patient Stated Goal: return home OT Goal Formulation: With patient Time For Goal Achievement: 01/07/16 Potential to Achieve Goals: Good  OT Frequency: Min 2X/week   Barriers to D/C:            Co-evaluation              End of Session Equipment Utilized During Treatment: Rolling walker;Oxygen Nurse Communication: Mobility status  Activity Tolerance: Patient tolerated treatment well Patient left: in chair;with call bell/phone within reach   Time: 1540-1616 OT Time Calculation (min): 36 min Charges:  OT General Charges $OT Visit: 1 Procedure OT Evaluation $OT Eval Moderate Complexity: 1 Procedure OT Treatments $Therapeutic Activity: 8-22 mins G-Codes:    Lazer Wollard M January 08, 2016, 4:48 PM

## 2015-12-24 NOTE — Progress Notes (Signed)
PROGRESS NOTE    Curtis Frazier  [id number] DOB: 1940-07-27 DOA: 12/22/2015 PCP: Loura Pardon, MD    Brief Narrative:  Patient is a 75 year gentleman history of hyperlipidemia, diabetes, chronic kidney disease stage III, diastolic CHF, paroxysmal atrial fibrillation on Coumadin/atrial flutter status post recent CTI ablation per cardiology 12/15/2015 who presents to the ED with a 2 day history of cough, shortness of breath, chest pain pleuritic in nature with subjective fever and noted per chest x-ray to have a right middle and right lower lobe pneumonia. Patient's blood pressure noted to be borderline. Patient started empirically on IV vancomycin IV cefepime for healthcare associated pneumonia.   Assessment & Plan:   Principal Problem:   HCAP (healthcare-associated pneumonia) Active Problems:   Coronary atherosclerosis   Rheumatoid arthritis ([place])   PMR (polymyalgia rheumatica) (HCC)   Back pain without radiation   Chronic diastolic congestive heart failure (HCC)   Paroxysmal atrial fibrillation (HCC)   Diabetes mellitus with complication (HCC)   Kidney disease, chronic, stage III (GFR 30-59 ml/min)   Hyperlipidemia   Hypoxia   Hyponatremia   GERD (gastroesophageal reflux disease)   Healthcare-associated pneumonia   #1 healthcare associated pneumonia Patient presented with subjective fever, shortness of breath, cough with chest x-ray findings consistent with a healthcare associated pneumonia in the right middle and right lower lobes. Titrate O2. Sputum Gram stain and culture pending. Urine Legionella antigen pending. Urine pneumococcus antigen pending. Patient noted to be hypoxic overnight and currently on nonrebreather at 10 L. Patient denies any significant shortness of breath. Continue empiric IV vancomycin, IV cefepime, Mucinex DM, Pulmicort, nebulizer treatments. Will give a dose of Lasix 40 mg IV 1. Follow.  #2 hypoxia Patient noted to be hypoxic overnight. May be secondary  to problem #1. Chest x-ray done early this morning showing findings concerning for developing right upper lobe pneumonia on a background of chronic interstitial lung disease. Continue empiric IV antibiotics. Will give a dose of Lasix 40 mg IV 1. Titrate to keep O2 between 88-94%.  #3 diabetes mellitus Hemoglobin A1c was 9.2 on 10/25/2015. Patient on chronic steroids. CBG has ranged from 133-250. Continue low-dose Levemir 5 mg daily. Continue sliding scale insulin.  #4 history of rheumatoid arthritis/PMR Stable. Patient was given a one-time dose of Solu-Cortef 50 minute grams IV 1. Continue prednisone 5 mg daily.  #5 chronic diastolic CHF/coronary artery disease 2-D echo from 09/06/2015 with a EF of 55-60% with grade 1 diastolic dysfunction. Patient not on diuretics at home. Patient with no JVD and no lower extremity edema. Patient noted to be hypoxic overnight. Saline lock IV fluids. Will give a dose of Lasix 40 mg IV 1. Monitor volume status closely.  #6 hyperlipidemia Continue statin.  #7 history of atrial flutter status post CTI ablation 12/15/2015/paroxysmal atrial fibrillation [id number] SCORE 5 Patient currently normal sinus rhythm with occasional PVCs. Patient asymptomatic. Continue diltiazem, amiodarone, Coumadin. INR is 1.91. Coumadin per pharmacy. Outpatient follow-up with cardiology.  #8 gastroesophageal reflux disease PPI.  #9 pleuritic chest pain Secondary to problem #1. Improving.  #10 COPD Stable. No wheezing noted. Continue Spiriva and Pulmicort. Continue albuterol nebulizers as needed.   #11 chronic kidney disease stage III Stable.  #10 hyponatremia TSH within normal limits. Serum osmolalities 288. Urine osmolality is 461. Urine sodium of 43. Cortisol level at 53.9. Improved.    DVT prophylaxis: Coumadin Code Status: Full Family Communication: Updated patient. No family at bedside. Disposition Plan: Remain in step down unit.   Consultants:  None  Procedures:   Chest x-ray 12/22/2015, 12/24/2015  Antimicrobials:   IV vancomycin 12/22/2015  IV cefepime 12/22/2015   Subjective: Patient states cough improving. SOB improving. CP improving. Patient states feeling better overall. Patient noted to be hypoxic overnight and currently on nonrebreather at 10 L.  Objective: Filed Vitals:   12/24/15 0800 12/24/15 0831 12/24/15 0833 12/24/15 0915  BP: 116/62   131/70  Pulse: 77     Temp: 98.4 F (36.9 C)     TempSrc: Oral     Resp: 20     Height:      Weight:      SpO2: 98% 96% 93%     Intake/Output Summary (Last 24 hours) at 12/24/15 0935 Last data filed at 12/24/15 [id number]  Gross per 24 hour  Intake 1292.5 ml  Output    900 ml  Net  392.5 ml   Filed Weights   12/22/15 1729  Weight: 74.39 kg (164 lb)    Examination:  General exam: Appears calm and comfortable  Respiratory system: Bibasilar crackles. No wheezing. Cardiovascular system: S1 & S2 heard, RRR. No JVD, murmurs, rubs, gallops or clicks. No pedal edema. Gastrointestinal system: Abdomen is nondistended, soft and nontender. No organomegaly or masses felt. Normal bowel sounds heard. Central nervous system: Alert and oriented. No focal neurological deficits. Extremities: Symmetric 5 x 5 power. Skin: No rashes, lesions or ulcers.Right groin area with a bruise however no thrill noted, nontender to palpation, no warmth. Psychiatry: Judgement and insight appear normal. Mood & affect appropriate.     Data Reviewed: I have personally reviewed following labs and imaging studies  CBC:  Recent Labs Lab 12/22/15 1747 12/23/15 0955 12/24/15 0308  WBC 10.8* 10.7* 12.7*  NEUTROABS 8.1*  --   --   HGB 12.4* 12.0* 11.6*  HCT 38.9* 37.6* 35.4*  MCV 91.3 90.6 90.3  PLT 277 272 [id number]   Basic Metabolic Panel:  Recent Labs Lab 12/22/15 1747 12/23/15 0955 12/24/15 0308  NA 129* 131* 133*  K 4.7 4.4 4.2  CL 97* 101 101  CO2 23 21* 23  GLUCOSE 278* 272*  112*  BUN 26* 22* 18  CREATININE 1.62* 1.44* 1.19  CALCIUM 9.2 8.9 8.9   GFR: Estimated Creatinine Clearance: 51.1 mL/min (by C-G formula based on Cr of 1.19). Liver Function Tests: No results for input(s): AST, ALT, ALKPHOS, BILITOT, PROT, ALBUMIN in the last 168 hours. No results for input(s): LIPASE, AMYLASE in the last 168 hours. No results for input(s): AMMONIA in the last 168 hours. Coagulation Profile:  Recent Labs Lab 12/22/15 2354 12/23/15 0434 12/24/15 0308  INR 2.29* 2.18* 1.91*   Cardiac Enzymes: No results for input(s): CKTOTAL, CKMB, CKMBINDEX, TROPONINI in the last 168 hours. BNP (last 3 results) No results for input(s): PROBNP in the last 8760 hours. HbA1C: No results for input(s): HGBA1C in the last 72 hours. CBG:  Recent Labs Lab 12/23/15 0755 12/23/15 1153 12/23/15 1527 12/23/15 2135 12/24/15 0821  GLUCAP 266* 372* 240* 156* 133*   Lipid Profile: No results for input(s): CHOL, HDL, LDLCALC, TRIG, CHOLHDL, LDLDIRECT in the last 72 hours. Thyroid Function Tests:  Recent Labs  12/23/15 0434  TSH 0.660   Anemia Panel: No results for input(s): VITAMINB12, FOLATE, FERRITIN, TIBC, IRON, RETICCTPCT in the last 72 hours. Sepsis Labs:  Recent Labs Lab 12/22/15 2309  LATICACIDVEN 0.95    Recent Results (from the past 240 hour(s))  MRSA PCR Screening     Status: None  Collection Time: 12/23/15  7:25 AM  Result Value Ref Range Status   MRSA by PCR NEGATIVE NEGATIVE Final    Comment:        The GeneXpert MRSA Assay (FDA approved for NASAL specimens only), is one component of a comprehensive MRSA colonization surveillance program. It is not intended to diagnose MRSA infection nor to guide or monitor treatment for MRSA infections.   Culture, sputum-assessment     Status: None   Collection Time: 12/23/15  5:15 PM  Result Value Ref Range Status   Specimen Description SPUTUM  Final   Special Requests NONE  Final   Sputum evaluation   Final     MICROSCOPIC FINDINGS SUGGEST THAT THIS SPECIMEN IS NOT REPRESENTATIVE OF LOWER RESPIRATORY SECRETIONS. PLEASE RECOLLECT. Deloris Ping RN [id number] 12/23/15 A BROWNING    Report Status 12/23/2015 FINAL  Final         Radiology Studies: Dg Chest 2 View  12/22/2015  CLINICAL DATA:  Cough and congestion for 2 days. Nausea. Question pneumonia. EXAM: CHEST  2 VIEW COMPARISON:  Two-view chest x-ray 11/10/2015. FINDINGS: The heart is mildly enlarged. There is slight increase in a diffuse interstitial pattern suggesting edema superimposed on chronic interstitial coarsening. Superior segment right lower lobe and right upper lobe airspace disease is present. Remote compression fractures are again noted in the mid thoracic spine. Thoracolumbar fusion is noted. IMPRESSION: 1. Superior segment right lower lobe and right upper lobe airspace disease concerning for pneumonia. 2. Mild generalized edema superimposed on chronic interstitial disease. Electronically Signed   By: San Morelle M.D.   On: 12/22/2015 18:36   Dg Chest Port 1 View  12/24/2015  CLINICAL DATA:  75 year old male with hypoxia. EXAM: PORTABLE CHEST 1 VIEW COMPARISON:  Chest radiograph dated 12/22/2015 FINDINGS: There is diffuse chronic interstitial coarsening and bronchiectatic changes. There is a patchy area of increased airspace density in the right upper lobe with progression compared to the study dated 12/22/2015 concerning for developing pneumonia. There is no pleural effusion or pneumothorax. Stable mild cardiomegaly. Osteopenia with degenerative changes of the spine. Lower thoracic and lumbar spine fixation hardware. No acute osseous pathology. IMPRESSION: Findings concerning for developing right upper lobe pneumonia on a background of chronic interstitial lung disease. Clinical correlation and follow-up recommended. Electronically Signed   By: Anner Crete M.D.   On: 12/24/2015 03:00        Scheduled Meds: .  amiodarone  200 mg Oral BID  . budesonide (PULMICORT) nebulizer solution  0.25 mg Nebulization BID  . calcium-vitamin D  1 tablet Oral Q breakfast  . ceFEPime (MAXIPIME) IV  1 g Intravenous Q24H  . dextromethorphan-guaiFENesin  2 tablet Oral BID  . diltiazem  240 mg Oral Daily  . docusate sodium  100 mg Oral BID  . ferrous sulfate  325 mg Oral TID WC  . furosemide  40 mg Intravenous Once  . insulin aspart  0-5 Units Subcutaneous QHS  . insulin aspart  0-9 Units Subcutaneous TID WC  . insulin detemir  5 Units Subcutaneous Daily  . pantoprazole  40 mg Oral Daily  . pravastatin  20 mg Oral QPM  . predniSONE  5 mg Oral Q breakfast  . thiamine  100 mg Oral Daily  . tiotropium  18 mcg Inhalation Daily  . vancomycin  1,000 mg Intravenous Q24H  . warfarin  2.5 mg Oral Q T,Th,S,Su-1800  . warfarin  5 mg Oral Q M,W,F-1800  . Warfarin - Pharmacist Dosing Inpatient  Does not apply q1800   Continuous Infusions:     LOS: 1 day    Time spent: 73 mins    THOMPSON,DANIEL, MD [facility] Pager [phone]  If 7PM-7AM, please contact night-coverage [email or web address] Password [email or web address] 12/24/2015, 9:35 AM

## 2015-12-24 NOTE — Progress Notes (Signed)
ANTICOAGULATION CONSULT NOTE - Follow Up Consult  Pharmacy Consult:  Coumadin Indication: atrial fibrillation  Allergies  Allergen Reactions  . Ace Inhibitors Other (See Comments)    Causes high K   . Angiotensin Receptor Blockers Other (See Comments)    Causes high K   . Metoprolol Other (See Comments)    May cause elevated K level   . Penicillins Swelling and Rash    Has patient had a PCN reaction causing immediate rash, facial/tongue/throat swelling, SOB or lightheadedness with hypotension: YES Has patient had a PCN reaction causing severe rash involving mucus membranes or skin necrosis: NO Has patient had a PCN reaction that required hospitalization NO Has patient had a PCN reaction occurring within the last 10 years: NO If all of the above answers are "NO", then may proceed with Cephalosporin use. Pt has tolerated cephalosporins in the past   . Tetracycline Swelling and Rash    Patient Measurements: Height: 5\' 8"  (172.7 cm) Weight: 164 lb (74.39 kg) IBW/kg (Calculated) : 68.4  Vital Signs: Temp: 98.4 F (36.9 C) (07/14 0800) Temp Source: Oral (07/14 0800) BP: 131/70 mmHg (07/14 0915) Pulse Rate: 77 (07/14 0800)  Labs:  Recent Labs  12/22/15 1747 12/22/15 2354 12/23/15 0434 12/23/15 0955 12/24/15 0308  HGB 12.4*  --   --  12.0* 11.6*  HCT 38.9*  --   --  37.6* 35.4*  PLT 277  --   --  272 285  LABPROT  --  25.0* 24.1*  --  21.8*  INR  --  2.29* 2.18*  --  1.91*  CREATININE 1.62*  --   --  1.44* 1.19    Estimated Creatinine Clearance: 51.1 mL/min (by C-G formula based on Cr of 1.19).    Assessment: 23 YOM continues on Coumadin from PTA for history of AFib.  INR slightly sub-therapeutic today; no bleeding reported.   Pharmacy also managing vancomycin and cefepime for HCAP.  Patient's renal function is improving.  Vanc 7/13 >> Cefepime 7/13 >>  7/12 BCx x2 -  7/13 MRSA PCR - negative 7/13 sputum - negative   Goal of Therapy:  INR 2-3 Vanc  trough: 15-20 mcg/mL    Plan:  - Coumadin 2.5mg  PO daily except 5mg  MWF, 5mg  today - Daily PT / INR - Change vanc to 750mg  IV Q12H and cefepime to 1gm IV Q8H - Monitor renal fxn, clinical progress, vanc trough at C[facility]   Shelbee Apgar D. Mina Marble, PharmD, BCPS Pager:  [phone] 12/24/2015, 10:00 AM

## 2015-12-25 ENCOUNTER — Inpatient Hospital Stay (HOSPITAL_COMMUNITY): Payer: Medicare Other

## 2015-12-25 DIAGNOSIS — J9601 Acute respiratory failure with hypoxia: Secondary | ICD-10-CM

## 2015-12-25 DIAGNOSIS — J9690 Respiratory failure, unspecified, unspecified whether with hypoxia or hypercapnia: Secondary | ICD-10-CM | POA: Clinically undetermined

## 2015-12-25 DIAGNOSIS — J189 Pneumonia, unspecified organism: Secondary | ICD-10-CM

## 2015-12-25 LAB — RESPIRATORY PANEL BY PCR
Adenovirus: NOT DETECTED
BORDETELLA PERTUSSIS-RVPCR: NOT DETECTED
Chlamydophila pneumoniae: NOT DETECTED
Coronavirus 229E: NOT DETECTED
Coronavirus HKU1: NOT DETECTED
Coronavirus NL63: NOT DETECTED
Coronavirus OC43: NOT DETECTED
INFLUENZA A H1 2009-RVPPR: NOT DETECTED
INFLUENZA A H1-RVPPCR: NOT DETECTED
INFLUENZA A-RVPPCR: NOT DETECTED
Influenza A H3: NOT DETECTED
Influenza B: NOT DETECTED
METAPNEUMOVIRUS-RVPPCR: NOT DETECTED
MYCOPLASMA PNEUMONIAE-RVPPCR: NOT DETECTED
PARAINFLUENZA VIRUS 2-RVPPCR: NOT DETECTED
PARAINFLUENZA VIRUS 3-RVPPCR: NOT DETECTED
PARAINFLUENZA VIRUS 4-RVPPCR: NOT DETECTED
Parainfluenza Virus 1: NOT DETECTED
RESPIRATORY SYNCYTIAL VIRUS-RVPPCR: NOT DETECTED
RHINOVIRUS / ENTEROVIRUS - RVPPCR: NOT DETECTED

## 2015-12-25 LAB — CBC
HCT: 38.6 % — ABNORMAL LOW (ref 39.0–52.0)
Hemoglobin: 12.4 g/dL — ABNORMAL LOW (ref 13.0–17.0)
MCH: 29.2 pg (ref 26.0–34.0)
MCHC: 32.1 g/dL (ref 30.0–36.0)
MCV: 91 fL (ref 78.0–100.0)
PLATELETS: 315 10*3/uL (ref 150–400)
RBC: 4.24 MIL/uL (ref 4.22–5.81)
RDW: 17.3 % — AB (ref 11.5–15.5)
WBC: 11.5 10*3/uL — ABNORMAL HIGH (ref 4.0–10.5)

## 2015-12-25 LAB — MAGNESIUM: MAGNESIUM: 1.4 mg/dL — AB (ref 1.7–2.4)

## 2015-12-25 LAB — GLUCOSE, CAPILLARY
GLUCOSE-CAPILLARY: 185 mg/dL — AB (ref 65–99)
GLUCOSE-CAPILLARY: 256 mg/dL — AB (ref 65–99)
GLUCOSE-CAPILLARY: 324 mg/dL — AB (ref 65–99)
GLUCOSE-CAPILLARY: 196 mg/dL — AB (ref 65–99)

## 2015-12-25 LAB — PROTIME-INR
INR: 1.6 — ABNORMAL HIGH (ref 0.00–1.49)
Prothrombin Time: 19.1 seconds — ABNORMAL HIGH (ref 11.6–15.2)

## 2015-12-25 LAB — BASIC METABOLIC PANEL
Anion gap: 9 (ref 5–15)
BUN: 18 mg/dL (ref 6–20)
CALCIUM: 9.4 mg/dL (ref 8.9–10.3)
CHLORIDE: 100 mmol/L — AB (ref 101–111)
CO2: 25 mmol/L (ref 22–32)
CREATININE: 1.46 mg/dL — AB (ref 0.61–1.24)
GFR calc non Af Amer: 45 mL/min — ABNORMAL LOW (ref >60)
GFR, EST AFRICAN AMERICAN: 52 mL/min — AB (ref >60)
Glucose, Bld: 124 mg/dL — ABNORMAL HIGH (ref 65–99)
POTASSIUM: 3.9 mmol/L (ref 3.5–5.1)
Sodium: 134 mmol/L — ABNORMAL LOW (ref 135–145)

## 2015-12-25 LAB — GRAM STAIN

## 2015-12-25 LAB — BRAIN NATRIURETIC PEPTIDE: B NATRIURETIC PEPTIDE 5: 186.4 pg/mL — AB (ref 0.0–100.0)

## 2015-12-25 LAB — VANCOMYCIN, TROUGH: Vancomycin Tr: 18 ug/mL (ref 15–20)

## 2015-12-25 LAB — STREP PNEUMONIAE URINARY ANTIGEN: Strep Pneumo Urinary Antigen: NEGATIVE

## 2015-12-25 MED ORDER — FUROSEMIDE 10 MG/ML IJ SOLN
20.0000 mg | Freq: Once | INTRAMUSCULAR | Status: AC
  Administered 2015-12-25: 20 mg via INTRAVENOUS
  Filled 2015-12-25: qty 2

## 2015-12-25 MED ORDER — MAGNESIUM SULFATE 4 GM/100ML IV SOLN
4.0000 g | Freq: Once | INTRAVENOUS | Status: AC
  Administered 2015-12-25: 4 g via INTRAVENOUS
  Filled 2015-12-25: qty 100

## 2015-12-25 MED ORDER — DEXTROSE 5 % IV SOLN
1.0000 g | INTRAVENOUS | Status: DC
  Administered 2015-12-26 – 2015-12-27 (×2): 1 g via INTRAVENOUS
  Filled 2015-12-25 (×2): qty 1

## 2015-12-25 MED ORDER — FUROSEMIDE 10 MG/ML IJ SOLN: 40.0000 mg | Freq: Once | INTRAMUSCULAR | Status: DC

## 2015-12-25 MED ORDER — FUROSEMIDE 10 MG/ML IJ SOLN
40.0000 mg | Freq: Once | INTRAMUSCULAR | Status: AC
  Administered 2015-12-25: 40 mg via INTRAVENOUS
  Filled 2015-12-25: qty 4

## 2015-12-25 MED ORDER — IOPAMIDOL (ISOVUE-370) INJECTION 76%
INTRAVENOUS | Status: AC
  Filled 2015-12-25: qty 100

## 2015-12-25 MED ORDER — ENOXAPARIN SODIUM 80 MG/0.8ML ~~LOC~~ SOLN
75.0000 mg | Freq: Two times a day (BID) | SUBCUTANEOUS | Status: DC
  Administered 2015-12-25 – 2015-12-27 (×4): 75 mg via SUBCUTANEOUS
  Filled 2015-12-25 (×4): qty 0.75

## 2015-12-25 MED ORDER — WARFARIN SODIUM 5 MG PO TABS
5.0000 mg | ORAL_TABLET | Freq: Once | ORAL | Status: AC
  Administered 2015-12-25: 5 mg via ORAL
  Filled 2015-12-25: qty 1

## 2015-12-25 NOTE — Consult Note (Signed)
PULMONARY / CRITICAL CARE MEDICINE   Name: JOFFREY RAJAN MRN: [id number] DOB: 17-Mar-1941    ADMISSION DATE:  12/22/2015 CONSULTATION DATE:  12/25/2015  REFERRING MD:  Dr. Grandville Silos, [facility]  CHIEF COMPLAINT:  Short of breath  HISTORY OF PRESENT ILLNESS:   75 yo male was in hospital 7/05 for cardiac ablation with hx of a flutter.  He developed cough, chest congestion and progressive dyspnea.  Noted to have hypoxia.  CXR showed Rt sided PNA.  He was started on ABx for HCAP.  He reports subjective fevers.  He has chest discomfort, but better now.  Still coughing up clear sputum.  Denies abdominal pain, sinus congestion, sore throat, headache, diarrhea, or skin rash.  He has hx of rheumatoid arthritis and has been on remicade most recently >> followed by Dr. Amil Amen with rheumatology.  PAST MEDICAL HISTORY :  He  has a past medical history of CAD (coronary artery disease); Hyperlipidemia; Personal history of colonic polyps; Actinic keratosis; Psoriatic arthropathy ([place]); Other psoriasis; Scoliosis (and kyphoscoliosis), idiopathic; GI bleed; Gastritis (12/10); Hypertensive heart disease; Dyspnea on exertion; Joint pain; Chronic back pain; Psoriasis; Bruises easily; Esophageal reflux; History of colonic polyps; Hemorrhoids; History of blood transfusion (2010); PMR (polymyalgia rheumatica) ([place]) (01/20/2012); Chronic diastolic CHF (congestive heart failure) ([place]); Moderate aortic stenosis; Paroxysmal atrial fibrillation ([place]); Lumbar stenosis; Carotid arterial disease ([place]); STEMI (ST elevation myocardial infarction) ([place]) (02/2010); Pneumonia (2008; 07/2015); COPD (chronic obstructive pulmonary disease) ([place]) (dx'd 12/10/2015); Sleep apnea; Type II diabetes mellitus ([name]); Rheumatoid arthritis(714.0); Degeneration of intervertebral disc, site unspecified; Arthritis; CKD (chronic kidney disease), stage III; Nephrolithiasis; and Skin cancer.  PAST SURGICAL HISTORY: He  has past surgical history that includes  Shoulder open rotator cuff repair (Left, 2000); Colonoscopy; Vasectomy (1979); carotid doppler (10/12); Cataract extraction w/ intraocular lens  implant, bilateral (Bilateral, 2012); Lithotripsy (2009); TEE without cardioversion (N/A, 08/06/2015); Cardioversion (N/A, 08/06/2015); Back surgery; Lumbar laminectomy (07/2015); Posterior laminectomy thoracic spine; Skin cancer excision; TEE without cardioversion (N/A, 09/14/2015); Cardioversion (N/A, 09/14/2015); TEE without cardioversion (N/A, 11/04/2015); Cardioversion (N/A, 11/04/2015); Cardiac catheterization (N/A, 12/15/2015); Coronary angioplasty with stent (02/2009); Cardiac catheterization (2010); and Laminectomy thoracic fusion posterior (06/2011).  Allergies  Allergen Reactions  . Ace Inhibitors Other (See Comments)    Causes high K   . Angiotensin Receptor Blockers Other (See Comments)    Causes high K   . Metoprolol Other (See Comments)    May cause elevated K level   . Penicillins Swelling and Rash    Has patient had a PCN reaction causing immediate rash, facial/tongue/throat swelling, SOB or lightheadedness with hypotension: YES Has patient had a PCN reaction causing severe rash involving mucus membranes or skin necrosis: NO Has patient had a PCN reaction that required hospitalization NO Has patient had a PCN reaction occurring within the last 10 years: NO If all of the above answers are "NO", then may proceed with Cephalosporin use. Pt has tolerated cephalosporins in the past   . Tetracycline Swelling and Rash    No current facility-administered medications on file prior to encounter.   Current Outpatient Prescriptions on File Prior to Encounter  Medication Sig  . acetaminophen (TYLENOL) 325 MG tablet Take 650 mg by mouth every 6 (six) hours as needed (Pain scale of 1-4 or temp > 101).  Marland Kitchen albuterol (PROVENTIL HFA;VENTOLIN HFA) 108 (90 Base) MCG/ACT inhaler Inhale 2 puffs into the lungs every 6 (six) hours as needed for wheezing or  shortness of breath.  Marland Kitchen amiodarone (PACERONE) 200 MG tablet Take  1 tablet (200 mg total) by mouth 2 (two) times daily.  . calcium-vitamin D (OSCAL WITH D) 500-200 MG-UNIT tablet Take 1 tablet by mouth daily with breakfast.  . diltiazem (CARDIZEM CD) 240 MG 24 hr capsule TAKE 1 CAPSULE BY MOUTH DAILY  . docusate sodium (COLACE) 100 MG capsule Take 100 mg by mouth 2 (two) times daily. Reported on 09/22/2015  . ferrous sulfate 325 (65 FE) MG tablet Take 1 tablet (325 mg total) by mouth 3 (three) times daily with meals.  Marland Kitchen glipiZIDE (GLUCOTROL) 10 MG tablet Take 1 tablet (10 mg total) by mouth 2 (two) times daily before a meal.  . pantoprazole (PROTONIX) 40 MG tablet Take 1 tablet (40 mg total) by mouth daily.  . pravastatin (PRAVACHOL) 20 MG tablet Take 1 tablet (20 mg total) by mouth every evening.  . predniSONE (DELTASONE) 5 MG tablet Take 1 tablet (5 mg total) by mouth daily with breakfast.  . thiamine 100 MG tablet Take 1 tablet (100 mg total) by mouth daily.  Marland Kitchen tiotropium (SPIRIVA) 18 MCG inhalation capsule Place 1 capsule (18 mcg total) into inhaler and inhale daily.  Marland Kitchen glucose blood (ONE TOUCH ULTRA TEST) test strip Check blood sugar three times daily and as directed. Dx E11.8 diabetes that is poorly controlled  . warfarin (COUMADIN) 2.5 MG tablet TAKE AS DIRECTED BY ANTI-COAGULATION CLINIC. (Patient not taking: Reported on 12/23/2015)    FAMILY HISTORY:  Family History  Problem Relation Age of Onset  . Coronary artery disease Mother   . Diabetes Mother   . CAD Mother   . Coronary artery disease Sister   . Hypertension Sister   . Kidney failure Sister   . CAD Sister   . Diabetes Brother   . Prostate cancer Brother   . Pancreatic cancer Brother   . Diabetes Brother   . Anesthesia problems Neg Hx   . Hypotension Neg Hx   . Malignant hyperthermia Neg Hx   . Pseudochol deficiency Neg Hx     SOCIAL HISTORY: He  reports that he quit smoking about 7 years ago. His smoking use included  Cigarettes. He has a 79.5 pack-year smoking history. He has never used smokeless tobacco. He reports that he drinks alcohol. He reports that he does not use illicit drugs.  REVIEW OF SYSTEMS:   Negative except above.  SUBJECTIVE:  Has cough.  VITAL SIGNS: BP 119/70 mmHg  Pulse 76  Temp(Src) 98.6 F (37 C) (Oral)  Resp 23  Ht 5\' 8"  (1.727 m)  Wt 164 lb (74.39 kg)  BMI 24.94 kg/m2  SpO2 89%  INTAKE / OUTPUT: I/O last 3 completed shifts: In: 972.5 [P.O.:120; I.V.:252.5; IV Piggyback:600] Out: 2625 [Urine:2625]  PHYSICAL EXAMINATION: General:  alert Neuro:  Normal strength HEENT:  No sinus tenderness, no oral exudate, no LAN Cardiovascular:  Regular, no murmur Lungs:  Crackles at bases, no wheeze Abdomen:  Soft, non tender Musculoskeletal:  No edema Skin:  No rashes  LABS:  BMET  Recent Labs Lab 12/23/15 0955 12/24/15 0308 12/25/15 0355  NA 131* 133* 134*  K 4.4 4.2 3.9  CL 101 101 100*  CO2 21* 23 25  BUN 22* 18 18  CREATININE 1.44* 1.19 1.46*  GLUCOSE 272* 112* 124*    Electrolytes  Recent Labs Lab 12/23/15 0955 12/24/15 0308 12/25/15 0355  CALCIUM 8.9 8.9 9.4  MG  --   --  1.4*    CBC  Recent Labs Lab 12/23/15 0955 12/24/15 0308 12/25/15 0355  WBC 10.7* 12.7* 11.5*  HGB 12.0* 11.6* 12.4*  HCT 37.6* 35.4* 38.6*  PLT 272 285 315    Coag's  Recent Labs Lab 12/23/15 0434 12/24/15 0308 12/25/15 0355  INR 2.18* 1.91* 1.60*    Sepsis Markers  Recent Labs Lab 12/22/15 2309  LATICACIDVEN 0.95   Glucose  Recent Labs Lab 12/24/15 0821 12/24/15 1234 12/24/15 1646 12/24/15 2131 12/24/15 2208 12/25/15 0824  GLUCAP 133* 246* 147* 205* 180* 185*    Imaging Dg Chest Port 1 View  12/24/2015  CLINICAL DATA:  75 year old male with hypoxia. EXAM: PORTABLE CHEST 1 VIEW COMPARISON:  Chest radiograph dated 12/22/2015 FINDINGS: There is diffuse chronic interstitial coarsening and bronchiectatic changes. There is a patchy area of  increased airspace density in the right upper lobe with progression compared to the study dated 12/22/2015 concerning for developing pneumonia. There is no pleural effusion or pneumothorax. Stable mild cardiomegaly. Osteopenia with degenerative changes of the spine. Lower thoracic and lumbar spine fixation hardware. No acute osseous pathology. IMPRESSION: Findings concerning for developing right upper lobe pneumonia on a background of chronic interstitial lung disease. Clinical correlation and follow-up recommended. Electronically Signed   By: Anner Crete M.D.   On: 12/24/2015 03:00    STUDIES:  7/15 CT angio chest >>  CULTURES: 7/12 Blood >> 7/15 RSV >>   ANTIBIOTICS: 7/12 Vancomycin >> 7/12 Cefepime  SIGNIFICANT EVENTS: 7/12 Admit  LINES/TUBES:  DISCUSSION: 75 yo male with acute hypoxic respiratory failure from HCAP.  He has hx of RA on remicade and low dose prednisone as outpt, reported hx of COPD, diastolic CHF.  ASSESSMENT / PLAN:  Acute hypoxic respiratory failure. - oxygen to keep SpO2 > 92% - f/u CT angio chest  HCAP. - continue Abx - f/u RSV panel  Acute on chronic diastolic CHF. - lasix  Reported hx of COPD >> doesn't feel nebulizer therapy helps. - change BDs to prn  Chesley Mires, MD [place] 12/25/2015, 10:35 AM Pager:  [phone] After 3pm call: [phone]

## 2015-12-25 NOTE — Progress Notes (Signed)
Pharmacy Antibiotic Note  Curtis Frazier is a 75 y.o. male admitted on 12/22/2015 with pneumonia.  Pharmacy has been consulted for vancomycin dosing.  Trough at goal.  Plan: This patient's current antibiotics will be continued without adjustments.  Height: 5\' 8"  (172.7 cm) Weight: 164 lb (74.39 kg) IBW/kg (Calculated) : 68.4  Temp (24hrs), Avg:98.7 F (37.1 C), Min:97.5 F (36.4 C), Max:100.4 F (38 C)   Recent Labs Lab 12/22/15 1747 12/22/15 2309 12/23/15 0955 12/24/15 0308 12/25/15 0355 12/25/15 2237  WBC 10.8*  --  10.7* 12.7* 11.5*  --   CREATININE 1.62*  --  1.44* 1.19 1.46*  --   LATICACIDVEN  --  0.95  --   --   --   --   VANCOTROUGH  --   --   --   --   --  18    Estimated Creatinine Clearance: 41.6 mL/min (by C-G formula based on Cr of 1.46).    Allergies  Allergen Reactions  . Ace Inhibitors Other (See Comments)    Causes high K   . Angiotensin Receptor Blockers Other (See Comments)    Causes high K   . Metoprolol Other (See Comments)    May cause elevated K level   . Penicillins Swelling and Rash    Has patient had a PCN reaction causing immediate rash, facial/tongue/throat swelling, SOB or lightheadedness with hypotension: YES Has patient had a PCN reaction causing severe rash involving mucus membranes or skin necrosis: NO Has patient had a PCN reaction that required hospitalization NO Has patient had a PCN reaction occurring within the last 10 years: NO If all of the above answers are "NO", then may proceed with Cephalosporin use. Pt has tolerated cephalosporins in the past   . Tetracycline Swelling and Rash    Antimicrobials this admission: Vanc 7/13 >>  Cefepime 7/13 >>   Microbiology results: 7/12 BCx x2 - negative  7/13 MRSA PCR - negative  7/13 sputum - negative  7/14 sputum - negative   Thank you for allowing pharmacy to be a part of this patient's care.  Wynona Neat, PharmD, BCPS  12/25/2015 11:53 PM

## 2015-12-25 NOTE — Progress Notes (Signed)
ANTICOAGULATION CONSULT NOTE - Initial Consult  Pharmacy Consult for lovenox  Indication: atrial fibrillation  Allergies  Allergen Reactions  . Ace Inhibitors Other (See Comments)    Causes high K   . Angiotensin Receptor Blockers Other (See Comments)    Causes high K   . Metoprolol Other (See Comments)    May cause elevated K level   . Penicillins Swelling and Rash    Has patient had a PCN reaction causing immediate rash, facial/tongue/throat swelling, SOB or lightheadedness with hypotension: YES Has patient had a PCN reaction causing severe rash involving mucus membranes or skin necrosis: NO Has patient had a PCN reaction that required hospitalization NO Has patient had a PCN reaction occurring within the last 10 years: NO If all of the above answers are "NO", then may proceed with Cephalosporin use. Pt has tolerated cephalosporins in the past   . Tetracycline Swelling and Rash    Patient Measurements: Height: 5\' 8"  (172.7 cm) Weight: 164 lb (74.39 kg) IBW/kg (Calculated) : 68.4  Vital Signs: Temp: 97.5 F (36.4 C) (07/15 1345) Temp Source: Oral (07/15 1345) BP: 109/64 mmHg (07/15 1345) Pulse Rate: 78 (07/15 1345)  Labs:  Recent Labs  12/23/15 0434 12/23/15 0955 12/24/15 0308 12/25/15 0355  HGB  --  12.0* 11.6* 12.4*  HCT  --  37.6* 35.4* 38.6*  PLT  --  272 285 315  LABPROT 24.1*  --  21.8* 19.1*  INR 2.18*  --  1.91* 1.60*  CREATININE  --  1.44* 1.19 1.46*    Estimated Creatinine Clearance: 41.6 mL/min (by C-G formula based on Cr of 1.46).   Medical History: Past Medical History  Diagnosis Date  . CAD (coronary artery disease)     a. 2010 s/p MI/Cath/PCI ([facility]): LM 10, LAD 30p, 99m, D1 20, D2 30, LCX 30p, 41m (3.5 x 15 Driver BMS), [id number], RCA 70p, 71m; b. 04/2011 Neg MV;  c. 04/2015 Myoview: very mild apical thinning, no ischemia, EF 65%.  . Hyperlipidemia     a. takes pravachol.  . Personal history of colonic polyps   . Actinic keratosis   .  Psoriatic arthropathy ([place])   . Other psoriasis   . Scoliosis (and kyphoscoliosis), idiopathic   . GI bleed     a. 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  . Gastritis 12/10  . Hypertensive heart disease     a. takes Metoprolol daily  . Dyspnea on exertion   . Joint pain   . Chronic back pain     scoliosis/stenosis/radiculopathy,degenerative disc disease  . Psoriasis   . Bruises easily   . Esophageal reflux     takes Omeprazole daily  . History of colonic polyps   . Hemorrhoids   . History of blood transfusion 2010    "don't remember why"  . PMR (polymyalgia rheumatica) (HCC) 01/20/2012    tx by specialist on a long prednisone taper  . Chronic diastolic CHF (congestive heart failure) ([place])     a. 08/2015 Echo: EF 55-60%, mild LVH, no rwma, Gr1 DD, mod AS, mildly dil LA, PASP 37mmHg.  . Moderate aortic stenosis     a. 08/2015 Mod AS, valve area (VTI) - 1.5cm^2, (Vmax) - 1.25cm^2; b. 10/2015 TEE EF 55-60%, mod AS.  Marland Kitchen Paroxysmal atrial fibrillation ([place])     a. 06/2011 Post-op AF-->converted on amio;  b. 07/2015 TEE/unsuccessful DCCV x 2; c. 09/2015 recurrent AF/Flutter-->successful DCCV; d. 09/2015 Recurrent AF-->10/2015 s/p TEE (EF 55-60%, mod AS, mild MR, sev  dil LA w/o LAA thrombus, sev dil RA, mild to mod TR) & DCCV (120 J x 1).  . Lumbar stenosis     a. s/p lumbar diskectomy 2017.  . Carotid arterial disease ([place])     a. 10/2015 Carotid US: bilat 1-39% heterogeneous plaque/stenosis. Nl subclavian arteries bilat.  Marland Kitchen STEMI (ST elevation myocardial infarction) ([place]) 02/2010    Archie Endo 05/2009  . Pneumonia 2008; 07/2015  . COPD (chronic obstructive pulmonary disease) ([place]) dx'd 12/10/2015  . Sleep apnea     "had it while in hospital in 07/2015" (12/15/2015)  . Type II diabetes mellitus ([place])   . Rheumatoid arthritis(714.0)     takes Methotrexate 7pills weekly  . Degeneration of intervertebral disc, site unspecified   . Arthritis     "hips" (12/15/2015)  . CKD (chronic kidney disease), stage III      a. 09/2015 AKI in setting of dehydration - seen @ [facility].  . Nephrolithiasis   . Skin cancer     "arms; they freeze then off; cut one off left elbow recently" (12/15/2015)   Assessment: 75 yom on chronic coumadin for afib and INR remains subtherapeutic. Will start lovenox until INR is therapeutic d/t recent ablation. CBC is stable and platelets are WNL. No bleeding noted.   Goal of Therapy:  Anti-Xa level 0.6-1 units/ml 4hrs after LMWH dose given Monitor platelets by anticoagulation protocol: Yes   Plan:  - Lovenox 75mg  SQ Q12H - F/u CBC Q72H  Kynsli Haapala, Rande Lawman 12/25/2015,4:31 PM

## 2015-12-25 NOTE — Progress Notes (Addendum)
[facility] NOTE    GARDINER Frazier  [id number] DOB: 1940/10/21 DOA: 76/05/2016 PCP: Loura Pardon, MD    Brief Narrative:  Patient is a 75 year gentleman history of hyperlipidemia, diabetes, chronic kidney disease stage III, diastolic CHF, paroxysmal atrial fibrillation on Coumadin/atrial flutter status post recent CTI ablation per cardiology 12/15/2015 who presents to the ED with a 2 day history of cough, shortness of breath, chest pain pleuritic in nature with subjective fever and noted per chest x-ray to have a right middle and right lower lobe pneumonia. Patient's blood pressure noted to be borderline. Patient started empirically on IV vancomycin IV cefepime for healthcare associated pneumonia.   Assessment & Plan:   Principal Problem:   Acute respiratory failure with hypoxia (HCC) Active Problems:   Coronary atherosclerosis   Rheumatoid arthritis (HCC)   PMR (polymyalgia rheumatica) (HCC)   Back pain without radiation   Chronic diastolic congestive heart failure (HCC)   Paroxysmal atrial fibrillation (HCC)   Diabetes mellitus with complication (HCC)   Kidney disease, chronic, stage III (GFR 30-59 ml/min)   Hyperlipidemia   Hypoxia   HCAP (healthcare-associated pneumonia)   Hyponatremia   GERD (gastroesophageal reflux disease)   Healthcare-associated pneumonia  #1 acute respiratory failure with hypoxia Patient noted to be hypoxic overnight for the past 2 days. Questionable etiology. Patient currently on 10 L nasal cannula high flow. Patient with some bibasilar crackles on examination however no lower extremity edema. Patient does not look significantly short of breath and looks clinically better than noted on his sats. Patient's INR is subtherapeutic at this time. Chest x-ray done yesterday morning showing findings concerning for developing right upper lobe pneumonia on a background of chronic interstitial lung disease. Continue empiric IV antibiotics. Will check a BNP. Check a CT  angiogram chest. Will give a dose of Lasix 20 mg IV 1. Titrate to keep O2 between 88-94%. Will consult with pulmonary and critical care medicine for further evaluation and management.  #2 healthcare associated pneumonia Patient presented with subjective fever, shortness of breath, cough with chest x-ray findings consistent with a healthcare associated pneumonia in the right middle and right lower lobes. Titrate O2. Sputum Gram stain and culture pending. Urine Legionella antigen pending. Urine pneumococcus antigen negative. Patient noted to be hypoxic overnight and currently on nasal cannula high flow at 12 L. Patient denies any significant shortness of breath. Continue empiric IV vancomycin, IV cefepime, Mucinex DM, Pulmicort, nebulizer treatments. Will give a dose of Lasix 20 mg IV 1. Will get a CT angiogram of his chest. Will check a BNP. Follow.   #3 diabetes mellitus Hemoglobin A1c was 9.2 on 10/25/2015. Patient on chronic steroids. CBG has ranged from 180-205. Continue low-dose Levemir 5 mg daily. Continue sliding scale insulin.  #4 history of rheumatoid arthritis/PMR Stable. Patient was given a one-time dose of Solu-Cortef 50 minute grams IV 1. Continue prednisone 5 mg daily.  #5 chronic diastolic CHF/coronary artery disease 2-D echo from 09/06/2015 with a EF of 55-60% with grade 1 diastolic dysfunction. Patient not on diuretics at home. Patient with no JVD and no lower extremity edema. Patient noted to be hypoxic overnight. Saline lock IV fluids. Patient was given a dose of Lasix yesterday 40 mg IV 1 with urine output of 2.17 L. Will check a BNP today as patient is still hypoxic. Monitor volume status closely.  #6 hyperlipidemia Continue statin.  #7 history of atrial flutter status post CTI ablation 12/15/2015/paroxysmal atrial fibrillation CHA2DS2VASC SCORE 5 Patient currently normal sinus  rhythm with occasional PVCs. Patient asymptomatic. Continue diltiazem, amiodarone, Coumadin. INR  is 1.60. Coumadin per pharmacy. Outpatient follow-up with cardiology.  #8 gastroesophageal reflux disease PPI.  #9 pleuritic chest pain Secondary to problem #1. Improving.  #10 COPD Stable. No wheezing noted. Continue Spiriva and Pulmicort. Continue albuterol nebulizers as needed.   #11 chronic kidney disease stage III Stable.  #10 hyponatremia TSH within normal limits. Serum osmolalities 288. Urine osmolality is 461. Urine sodium of 43. Cortisol level at 53.9. Improved.    DVT prophylaxis: Coumadin Code Status: Full Family Communication: Updated patient. No family at bedside. Disposition Plan: Remain in step down unit.   Consultants:   None  Procedures:   Chest x-ray 12/22/2015, 12/24/2015  Antimicrobials:   IV vancomycin 12/22/2015  IV cefepime 12/22/2015   Subjective: Patient states cough improving. SOB improving. CP improving. Patient states feeling better overall. Patient noted to be hypoxic overnight and currently on [place] high flow at 12 L.  Objective: Filed Vitals:   12/25/15 0500 12/25/15 0512 12/25/15 0800 12/25/15 0832  BP: 125/68  119/70   Pulse:   76   Temp: 100.4 F (38 C)  98.6 F (37 C)   TempSrc: Axillary  Oral   Resp:   23   Height:      Weight:      SpO2: 88% 89% 90% 89%    Intake/Output Summary (Last 24 hours) at 12/25/15 0925 Last data filed at 12/25/15 0800  Gross per 24 hour  Intake    520 ml  Output   2475 ml  Net  -1955 ml   Filed Weights   12/22/15 1729  Weight: 74.39 kg (164 lb)    Examination:  General exam: Appears calm and comfortable  Respiratory system: Bibasilar crackles. No wheezing. Cardiovascular system: S1 & S2 heard, RRR. No JVD, murmurs, rubs, gallops or clicks. No pedal edema. Gastrointestinal system: Abdomen is nondistended, soft and nontender. No organomegaly or masses felt. Normal bowel sounds heard. Central nervous system: Alert and oriented. No focal neurological deficits. Extremities: Symmetric 5 x  5 power. Skin: No rashes, lesions or ulcers.Right groin area with a bruise however no thrill noted, nontender to palpation, no warmth. Psychiatry: Judgement and insight appear normal. Mood & affect appropriate.     Data Reviewed: I have personally reviewed following labs and imaging studies  CBC:  Recent Labs Lab 12/22/15 1747 12/23/15 0955 12/24/15 0308 12/25/15 0355  WBC 10.8* 10.7* 12.7* 11.5*  NEUTROABS 8.1*  --   --   --   HGB 12.4* 12.0* 11.6* 12.4*  HCT 38.9* 37.6* 35.4* 38.6*  MCV 91.3 90.6 90.3 91.0  PLT 277 272 285 [id number]   Basic Metabolic Panel:  Recent Labs Lab 12/22/15 1747 12/23/15 0955 12/24/15 0308 12/25/15 0355  NA 129* 131* 133* 134*  K 4.7 4.4 4.2 3.9  CL 97* 101 101 100*  CO2 23 21* 23 25  GLUCOSE 278* 272* 112* 124*  BUN 26* 22* 18 18  CREATININE 1.62* 1.44* 1.19 1.46*  CALCIUM 9.2 8.9 8.9 9.4  MG  --   --   --  1.4*   GFR: Estimated Creatinine Clearance: 41.6 mL/min (by C-G formula based on Cr of 1.46). Liver Function Tests: No results for input(s): AST, ALT, ALKPHOS, BILITOT, PROT, ALBUMIN in the last 168 hours. No results for input(s): LIPASE, AMYLASE in the last 168 hours. No results for input(s): AMMONIA in the last 168 hours. Coagulation Profile:  Recent Labs Lab 12/22/15 2354 12/23/15 0434 12/24/15  0308 12/25/15 0355  INR 2.29* 2.18* 1.91* 1.60*   Cardiac Enzymes: No results for input(s): CKTOTAL, CKMB, CKMBINDEX, TROPONINI in the last 168 hours. BNP (last 3 results) No results for input(s): PROBNP in the last 8760 hours. HbA1C: No results for input(s): HGBA1C in the last 72 hours. CBG:  Recent Labs Lab 12/24/15 1234 12/24/15 1646 12/24/15 2131 12/24/15 2208 12/25/15 0824  GLUCAP 246* 147* 205* 180* 185*   Lipid Profile: No results for input(s): CHOL, HDL, LDLCALC, TRIG, CHOLHDL, LDLDIRECT in the last 72 hours. Thyroid Function Tests:  Recent Labs  12/23/15 0434  TSH 0.660   Anemia Panel: No results for  input(s): VITAMINB12, FOLATE, FERRITIN, TIBC, IRON, RETICCTPCT in the last 72 hours. Sepsis Labs:  Recent Labs Lab 12/22/15 2309  LATICACIDVEN 0.95    Recent Results (from the past 240 hour(s))  Blood culture (routine x 2)     Status: None (Preliminary result)   Collection Time: 12/22/15 10:55 PM  Result Value Ref Range Status   Specimen Description BLOOD RIGHT ANTECUBITAL  Final   Special Requests BOTTLES DRAWN AEROBIC AND ANAEROBIC 5CC  Final   Culture NO GROWTH 1 DAY  Final   Report Status PENDING  Incomplete  Blood culture (routine x 2)     Status: None (Preliminary result)   Collection Time: 12/22/15 11:00 PM  Result Value Ref Range Status   Specimen Description BLOOD RIGHT HAND  Final   Special Requests IN PEDIATRIC BOTTLE 2CC  Final   Culture NO GROWTH 1 DAY  Final   Report Status PENDING  Incomplete  MRSA PCR Screening     Status: None   Collection Time: 12/23/15  7:25 AM  Result Value Ref Range Status   MRSA by PCR NEGATIVE NEGATIVE Final    Comment:        The GeneXpert MRSA Assay (FDA approved for NASAL specimens only), is one component of a comprehensive MRSA colonization surveillance program. It is not intended to diagnose MRSA infection nor to guide or monitor treatment for MRSA infections.   Culture, sputum-assessment     Status: None   Collection Time: 12/23/15  5:15 PM  Result Value Ref Range Status   Specimen Description SPUTUM  Final   Special Requests NONE  Final   Sputum evaluation   Final    MICROSCOPIC FINDINGS SUGGEST THAT THIS SPECIMEN IS NOT REPRESENTATIVE OF LOWER RESPIRATORY SECRETIONS. PLEASE RECOLLECT. Deloris Ping RN [id number] 12/23/15 A BROWNING    Report Status 12/23/2015 FINAL  Final  Gram stain     Status: None   Collection Time: 12/24/15 11:25 PM  Result Value Ref Range Status   Specimen Description EXPECTORATED SPUTUM  Final   Special Requests NONE  Final   Gram Stain   Final    MODERATE WBC PRESENT,BOTH PMN AND  MONONUCLEAR RARE SQUAMOUS EPITHELIAL CELLS PRESENT FEW GRAM POSITIVE COCCI FEW GRAM VARIABLE ROD    Report Status 12/25/2015 FINAL  Final         Radiology Studies: Dg Chest Port 1 View  12/24/2015  CLINICAL DATA:  75 year old male with hypoxia. EXAM: PORTABLE CHEST 1 VIEW COMPARISON:  Chest radiograph dated 12/22/2015 FINDINGS: There is diffuse chronic interstitial coarsening and bronchiectatic changes. There is a patchy area of increased airspace density in the right upper lobe with progression compared to the study dated 12/22/2015 concerning for developing pneumonia. There is no pleural effusion or pneumothorax. Stable mild cardiomegaly. Osteopenia with degenerative changes of the spine. Lower thoracic and lumbar  spine fixation hardware. No acute osseous pathology. IMPRESSION: Findings concerning for developing right upper lobe pneumonia on a background of chronic interstitial lung disease. Clinical correlation and follow-up recommended. Electronically Signed   By: Anner Crete M.D.   On: 12/24/2015 03:00        Scheduled Meds: . amiodarone  200 mg Oral BID  . budesonide (PULMICORT) nebulizer solution  0.25 mg Nebulization BID  . calcium-vitamin D  1 tablet Oral Q breakfast  . [START ON 12/26/2015] ceFEPime (MAXIPIME) IV  1 g Intravenous Q24H  . dextromethorphan-guaiFENesin  2 tablet Oral BID  . diltiazem  240 mg Oral Daily  . docusate sodium  100 mg Oral BID  . ferrous sulfate  325 mg Oral TID WC  . insulin aspart  0-5 Units Subcutaneous QHS  . insulin aspart  0-9 Units Subcutaneous TID WC  . insulin detemir  5 Units Subcutaneous Daily  . magnesium sulfate 1 - 4 g bolus IVPB  4 g Intravenous Once  . pantoprazole  40 mg Oral Daily  . pravastatin  20 mg Oral QPM  . predniSONE  5 mg Oral Q breakfast  . thiamine  100 mg Oral Daily  . tiotropium  18 mcg Inhalation Daily  . vancomycin  750 mg Intravenous Q12H  . warfarin  5 mg Oral ONCE-1800  . Warfarin - Pharmacist Dosing  Inpatient   Does not apply q1800   Continuous Infusions:     LOS: 2 days    Time spent: 48 mins    THOMPSON,DANIEL, MD [facility] Pager [phone]  If 7PM-7AM, please contact night-coverage [email or web address] Password [facility] 12/25/2015, 9:25 AM

## 2015-12-25 NOTE — Progress Notes (Signed)
ANTICOAGULATION CONSULT NOTE - Follow Up Consult  Pharmacy Consult:  Coumadin Indication: atrial fibrillation  Allergies  Allergen Reactions  . Ace Inhibitors Other (See Comments)    Causes high K   . Angiotensin Receptor Blockers Other (See Comments)    Causes high K   . Metoprolol Other (See Comments)    May cause elevated K level   . Penicillins Swelling and Rash    Has patient had a PCN reaction causing immediate rash, facial/tongue/throat swelling, SOB or lightheadedness with hypotension: YES Has patient had a PCN reaction causing severe rash involving mucus membranes or skin necrosis: NO Has patient had a PCN reaction that required hospitalization NO Has patient had a PCN reaction occurring within the last 10 years: NO If all of the above answers are "NO", then may proceed with Cephalosporin use. Pt has tolerated cephalosporins in the past   . Tetracycline Swelling and Rash    Patient Measurements: Height: 5\' 8"  (172.7 cm) Weight: 164 lb (74.39 kg) IBW/kg (Calculated) : 68.4  Vital Signs: Temp: 100.4 F (38 C) (07/15 0500) Temp Source: Axillary (07/15 0500) BP: 125/68 mmHg (07/15 0500) Pulse Rate: 81 (07/14 2159)  Labs:  Recent Labs  12/23/15 0434 12/23/15 0955 12/24/15 0308 12/25/15 0355  HGB  --  12.0* 11.6* 12.4*  HCT  --  37.6* 35.4* 38.6*  PLT  --  272 285 315  LABPROT 24.1*  --  21.8* 19.1*  INR 2.18*  --  1.91* 1.60*  CREATININE  --  1.44* 1.19 1.46*    Estimated Creatinine Clearance: 41.6 mL/min (by C-G formula based on Cr of 1.46).    Assessment: 32 YOM continues on Coumadin from PTA for history of AFib.  INR decreased further today on home dose.  No bleeding reported.  Home Coumadin dose:  2.5mg  daily except 5mg  MWF    Patient continues on vancomycin and cefepime for HCAP.  His renal function is worsening; however, his UOP is good.  Vanc 7/13 >> Cefepime 7/13 >>  7/12 BCx x2 - negative 7/13 MRSA PCR - negative 7/13 sputum -  negative 7/14 sputum - negative   Goal of Therapy:  INR 2-3 Vanc trough: 15-20 mcg/mL    Plan:  - D/C standing Coumadin order - Coumadin 5mg  PO today - Daily PT / INR - Contnue Vanc 750mg  IV Q12H, check VT tonight - Change cefepime to 1gm IV Q24H - Monitor renal fxn, clinical progress    Curtis Frazier D. Mina Marble, PharmD, BCPS Pager:  [phone] 12/25/2015, 8:37 AM

## 2015-12-25 NOTE — Progress Notes (Addendum)
RT took pt off Venturi Mask and placed on 10L HFNC with spo2 93%. Pt reports it is much more comfortable and would like to continue instead of mask. RT will continue to monitor and wean as tolerated

## 2015-12-26 DIAGNOSIS — I483 Typical atrial flutter: Secondary | ICD-10-CM

## 2015-12-26 DIAGNOSIS — I5033 Acute on chronic diastolic (congestive) heart failure: Secondary | ICD-10-CM

## 2015-12-26 LAB — GLUCOSE, CAPILLARY
Glucose-Capillary: 235 mg/dL — ABNORMAL HIGH (ref 65–99)
Glucose-Capillary: 263 mg/dL — ABNORMAL HIGH (ref 65–99)
Glucose-Capillary: 329 mg/dL — ABNORMAL HIGH (ref 65–99)
Glucose-Capillary: 329 mg/dL — ABNORMAL HIGH (ref 65–99)

## 2015-12-26 LAB — CBC WITH DIFFERENTIAL/PLATELET
BASOS PCT: 0 %
Basophils Absolute: 0 10*3/uL (ref 0.0–0.1)
EOS PCT: 1 %
Eosinophils Absolute: 0.1 10*3/uL (ref 0.0–0.7)
HCT: 34.3 % — ABNORMAL LOW (ref 39.0–52.0)
Hemoglobin: 11.3 g/dL — ABNORMAL LOW (ref 13.0–17.0)
LYMPHS ABS: 1.6 10*3/uL (ref 0.7–4.0)
Lymphocytes Relative: 13 %
MCH: 29.7 pg (ref 26.0–34.0)
MCHC: 32.9 g/dL (ref 30.0–36.0)
MCV: 90 fL (ref 78.0–100.0)
MONO ABS: 1.2 10*3/uL — AB (ref 0.1–1.0)
Monocytes Relative: 10 %
NEUTROS ABS: 9.3 10*3/uL — AB (ref 1.7–7.7)
Neutrophils Relative %: 76 %
Platelets: 317 10*3/uL (ref 150–400)
RBC: 3.81 MIL/uL — ABNORMAL LOW (ref 4.22–5.81)
RDW: 17.1 % — AB (ref 11.5–15.5)
WBC: 12.2 10*3/uL — ABNORMAL HIGH (ref 4.0–10.5)

## 2015-12-26 LAB — BASIC METABOLIC PANEL
Anion gap: 9 (ref 5–15)
BUN: 26 mg/dL — AB (ref 6–20)
CO2: 25 mmol/L (ref 22–32)
CREATININE: 1.65 mg/dL — AB (ref 0.61–1.24)
Calcium: 9.1 mg/dL (ref 8.9–10.3)
Chloride: 95 mmol/L — ABNORMAL LOW (ref 101–111)
GFR calc Af Amer: 45 mL/min — ABNORMAL LOW (ref >60)
GFR, EST NON AFRICAN AMERICAN: 39 mL/min — AB (ref >60)
GLUCOSE: 242 mg/dL — AB (ref 65–99)
POTASSIUM: 3.9 mmol/L (ref 3.5–5.1)
Sodium: 129 mmol/L — ABNORMAL LOW (ref 135–145)

## 2015-12-26 LAB — MAGNESIUM: Magnesium: 1.7 mg/dL (ref 1.7–2.4)

## 2015-12-26 LAB — PROTIME-INR
INR: 1.89 — AB (ref 0.00–1.49)
PROTHROMBIN TIME: 21.7 s — AB (ref 11.6–15.2)

## 2015-12-26 LAB — PROCALCITONIN: PROCALCITONIN: 0.43 ng/mL

## 2015-12-26 LAB — BRAIN NATRIURETIC PEPTIDE: B Natriuretic Peptide: 104.6 pg/mL — ABNORMAL HIGH (ref 0.0–100.0)

## 2015-12-26 MED ORDER — INSULIN DETEMIR 100 UNIT/ML ~~LOC~~ SOLN
10.0000 [IU] | Freq: Every day | SUBCUTANEOUS | Status: DC
  Administered 2015-12-26: 10 [IU] via SUBCUTANEOUS
  Filled 2015-12-26 (×2): qty 0.1

## 2015-12-26 MED ORDER — MAGNESIUM SULFATE 4 GM/100ML IV SOLN
4.0000 g | Freq: Once | INTRAVENOUS | Status: AC
  Administered 2015-12-26: 4 g via INTRAVENOUS
  Filled 2015-12-26: qty 100

## 2015-12-26 MED ORDER — FUROSEMIDE 10 MG/ML IJ SOLN
40.0000 mg | Freq: Once | INTRAMUSCULAR | Status: AC
  Administered 2015-12-26: 40 mg via INTRAVENOUS
  Filled 2015-12-26: qty 4

## 2015-12-26 MED ORDER — WARFARIN SODIUM 4 MG PO TABS
4.0000 mg | ORAL_TABLET | Freq: Once | ORAL | Status: AC
Start: 2015-12-26 — End: 2015-12-26
  Administered 2015-12-26: 4 mg via ORAL
  Filled 2015-12-26: qty 1

## 2015-12-26 MED ORDER — FUROSEMIDE 10 MG/ML IJ SOLN
20.0000 mg | Freq: Once | INTRAMUSCULAR | Status: AC
  Administered 2015-12-26: 20 mg via INTRAVENOUS
  Filled 2015-12-26: qty 2

## 2015-12-26 MED ORDER — POTASSIUM CHLORIDE CRYS ER 20 MEQ PO TBCR
20.0000 meq | EXTENDED_RELEASE_TABLET | Freq: Once | ORAL | Status: AC
  Administered 2015-12-26: 20 meq via ORAL
  Filled 2015-12-26: qty 1

## 2015-12-26 NOTE — Progress Notes (Signed)
ANTICOAGULATION CONSULT NOTE - Follow Up Consult  Pharmacy Consult:  Coumadin Indication: atrial fibrillation  Allergies  Allergen Reactions  . Ace Inhibitors Other (See Comments)    Causes high K   . Angiotensin Receptor Blockers Other (See Comments)    Causes high K   . Metoprolol Other (See Comments)    May cause elevated K level   . Penicillins Swelling and Rash    Has patient had a PCN reaction causing immediate rash, facial/tongue/throat swelling, SOB or lightheadedness with hypotension: YES Has patient had a PCN reaction causing severe rash involving mucus membranes or skin necrosis: NO Has patient had a PCN reaction that required hospitalization NO Has patient had a PCN reaction occurring within the last 10 years: NO If all of the above answers are "NO", then may proceed with Cephalosporin use. Pt has tolerated cephalosporins in the past   . Tetracycline Swelling and Rash    Patient Measurements: Height: 5\' 8"  (172.7 cm) Weight: 164 lb (74.39 kg) IBW/kg (Calculated) : 68.4  Vital Signs: Temp: 98.5 F (36.9 C) (07/16 0800) Temp Source: Oral (07/16 0800) BP: 99/62 mmHg (07/16 0800) Pulse Rate: 84 (07/16 0800)  Labs:  Recent Labs  12/24/15 0308 12/25/15 0355 12/26/15 0358  HGB 11.6* 12.4* 11.3*  HCT 35.4* 38.6* 34.3*  PLT 285 315 317  LABPROT 21.8* 19.1* 21.7*  INR 1.91* 1.60* 1.89*  CREATININE 1.19 1.46* 1.65*    Estimated Creatinine Clearance: 36.8 mL/min (by C-G formula based on Cr of 1.65).    Assessment: 70 YOM continues on Coumadin from PTA for history of AFib.  INR dipped below therapeutic range and Lovenox bridge added given recent ablation.  INR trending back up today; patient's renal function is worsening; no bleeding reported.   Goal of Therapy:  INR 2-3 Anti-Xa level 0.6-1 units/ml 4hrs after LMWH dose given    Plan:  - Coumadin 4mg  PO today - Continue Lovenox 75mg  SQ Q12H until INR therapeutic - Daily PT / INR - Consider  resuming home med glipizide for better glycemic control   Yael Angerer D. Mina Marble, PharmD, BCPS Pager:  [phone] 12/26/2015, 8:29 AM

## 2015-12-26 NOTE — Progress Notes (Signed)
Pt desats frequently while sleeping.  At the beginning of the shift pt was on 6L [place] and sats were 97%-98%.  As soon as pt went to sleep he began satting in the upper 80's to 90.  Pt is resting calmly, in no apparent distress, pt does not feel SOB. Oxygen is now at 9 L [place]. RN will continue to monitor.

## 2015-12-26 NOTE — Consult Note (Signed)
CARDIOLOGY CONSULT NOTE  Patient ID: Curtis Frazier MRN: [id number] DOB/AGE: [birth year] 75 y.o.  Admit date: 12/22/2015 Primary Cardiologist: Johnsie Cancel Reason for Consultation: ?CHF  HPI: 75 yo with complicated past history including RA and PMR, CAD, chronic diastolic CHF, atrial fibrillation (paroxysmal) and atrial flutter (s/p ablation) was admitted with presumed PNA.  Patient had atrial flutter ablation on 12/15/15.  He developed cough and dyspnea at home prior to this admission on 7/13.  He was hypoxic in the ER with right-sided infiltrate on CXR.  He was started on cefepime and vancomycin.  CTA chest showed lower lobe consolidation and also ground glass concerning for pulmonary edema.  No PE.  He was given a dose of Lasix yesterday with good UOP.  Today, he remains on oxygen.  Short of breath with exertion, ok at rest.  No chest pain.  He has remained in NSR.   Review of systems complete and found to be negative unless listed above in HPI  Past Medical History: 1. Polymyalgia rheumatica 2. CAD: s/p PCI to LCX in 2010. Cardiolite in 11/16 with no ischemia.  3. Chronic diastolic CHF: TEE ([id number]) with EF 55-60%, moderate AS. 4. Aortic stenosis: Moderate on last echo in 5/17.  5. 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  6. Psoriasis 7. Hyperlipidemia 8. GERD 9. Nephrolithiasis 10. Rheumatoid arthritis: On Remicade 11. Type II diabetes 12. Atrial fibrillation: Paroxysmal.  Has had DCCVs in past.  13. CKD 14. Atrial flutter: s/p ablation 12/15/15.  15. Lumbar laminectomy 16. COPD: Emphysema on CT.   Family History  Problem Relation Age of Onset  . Coronary artery disease Mother   . Diabetes Mother   . CAD Mother   . Coronary artery disease Sister   . Hypertension Sister   . Kidney failure Sister   . CAD Sister   . Diabetes Brother   . Prostate cancer Brother   . Pancreatic cancer Brother   . Diabetes Brother   . Anesthesia problems Neg Hx   . Hypotension Neg Hx   .  Malignant hyperthermia Neg Hx   . Pseudochol deficiency Neg Hx     Social History   Social History  . Marital Status: Married    Spouse Name: N/A  . Number of Children: N/A  . Years of Education: N/A   Occupational History  . Not on file.   Social History Main Topics  . Smoking status: Former Smoker -- 1.50 packs/day for 53 years    Types: Cigarettes    Quit date: 11/22/2008  . Smokeless tobacco: Never Used  . Alcohol Use: 0.0 oz/week    0 Standard drinks or equivalent per week     Comment: 7/5//2017 "might have 1-2 margaritas in the summertime"  . Drug Use: No  . Sexual Activity: No   Other Topics Concern  . Not on file   Social History Narrative     Prescriptions prior to admission  Medication Sig Dispense Refill Last Dose  . acetaminophen (TYLENOL) 325 MG tablet Take 650 mg by mouth every 6 (six) hours as needed (Pain scale of 1-4 or temp > 101).   Past Week at Unknown time  . albuterol (PROVENTIL HFA;VENTOLIN HFA) 108 (90 Base) MCG/ACT inhaler Inhale 2 puffs into the lungs every 6 (six) hours as needed for wheezing or shortness of breath. 1 Inhaler 5 12/22/2015 at Unknown time  . amiodarone (PACERONE) 200 MG tablet Take 1 tablet (200 mg total) by mouth 2 (  two) times daily. 180 tablet 3 12/22/2015 at Unknown time  . calcium-vitamin D (OSCAL WITH D) 500-200 MG-UNIT tablet Take 1 tablet by mouth daily with breakfast.   12/22/2015 at Unknown time  . diltiazem (CARDIZEM CD) 240 MG 24 hr capsule TAKE 1 CAPSULE BY MOUTH DAILY 30 capsule 5 12/22/2015 at Unknown time  . docusate sodium (COLACE) 100 MG capsule Take 100 mg by mouth 2 (two) times daily. Reported on 09/22/2015   12/22/2015 at Unknown time  . ferrous sulfate 325 (65 FE) MG tablet Take 1 tablet (325 mg total) by mouth 3 (three) times daily with meals. 90 tablet 3 12/22/2015 at Unknown time  . glipiZIDE (GLUCOTROL) 10 MG tablet Take 1 tablet (10 mg total) by mouth 2 (two) times daily before a meal. 180 tablet 1 12/22/2015 at  Unknown time  . pantoprazole (PROTONIX) 40 MG tablet Take 1 tablet (40 mg total) by mouth daily. 30 tablet 5 12/22/2015 at Unknown time  . pravastatin (PRAVACHOL) 20 MG tablet Take 1 tablet (20 mg total) by mouth every evening. 90 tablet 3 12/21/2015 at Unknown time  . predniSONE (DELTASONE) 5 MG tablet Take 1 tablet (5 mg total) by mouth daily with breakfast. 30 tablet 0 12/22/2015 at Unknown time  . thiamine 100 MG tablet Take 1 tablet (100 mg total) by mouth daily. 30 tablet 5 12/22/2015 at Unknown time  . tiotropium (SPIRIVA) 18 MCG inhalation capsule Place 1 capsule (18 mcg total) into inhaler and inhale daily. 30 capsule 2 12/21/2015 at Unknown time  . warfarin (COUMADIN) 2.5 MG tablet Take 2.5-5 mg by mouth See admin instructions. Take 2 tablets on Monday, Wednesday and Friday then take 1 tablet all the other days   12/21/2015 at 2100  . glucose blood (ONE TOUCH ULTRA TEST) test strip Check blood sugar three times daily and as directed. Dx E11.8 diabetes that is poorly controlled 270 each 3 Taking  . warfarin (COUMADIN) 2.5 MG tablet TAKE AS DIRECTED BY ANTI-COAGULATION CLINIC. (Patient not taking: Reported on 12/23/2015) 90 tablet 3 Not Taking at Unknown time   Current Scheduled Meds: . amiodarone  200 mg Oral BID  . calcium-vitamin D  1 tablet Oral Q breakfast  . ceFEPime (MAXIPIME) IV  1 g Intravenous Q24H  . dextromethorphan-guaiFENesin  2 tablet Oral BID  . diltiazem  240 mg Oral Daily  . docusate sodium  100 mg Oral BID  . enoxaparin (LOVENOX) injection  75 mg Subcutaneous Q12H  . ferrous sulfate  325 mg Oral TID WC  . furosemide  40 mg Intravenous Once  . insulin aspart  0-5 Units Subcutaneous QHS  . insulin aspart  0-9 Units Subcutaneous TID WC  . insulin detemir  10 Units Subcutaneous Daily  . pantoprazole  40 mg Oral Daily  . potassium chloride  20 mEq Oral Once  . pravastatin  20 mg Oral QPM  . predniSONE  5 mg Oral Q breakfast  . thiamine  100 mg Oral Daily  . warfarin  4 mg  Oral ONCE-1800  . Warfarin - Pharmacist Dosing Inpatient   Does not apply q1800   Continuous Infusions:  PRN Meds:.acetaminophen, albuterol   Physical exam Blood pressure 107/55, pulse 79, temperature 98.1 F (36.7 C), temperature source Oral, resp. rate 26, height 5\' 8"  (1.727 m), weight 164 lb (74.39 kg), SpO2 95 %. General: NAD Neck: JVP difficult, not markedly elevated, no thyromegaly or thyroid nodule.  Lungs: Rhonchi and crackles bilaterally.  CV: Nondisplaced PMI.  Heart regular  S1/S2, no S3/S4, 3/6 crescendo-decrescendo murmur RUSB with clear S2.  No peripheral edema.   Abdomen: Soft, nontender, no hepatosplenomegaly, no distention.  Skin: Intact without lesions or rashes.  Neurologic: Alert and oriented x 3.  Psych: Normal affect. Extremities: No clubbing or cyanosis.  HEENT: Normal.   Labs:   Lab Results  Component Value Date   WBC 12.2* 12/26/2015   HGB 11.3* 12/26/2015   HCT 34.3* 12/26/2015   MCV 90.0 12/26/2015   PLT 317 12/26/2015    Recent Labs Lab 12/26/15 0358  NA 129*  K 3.9  CL 95*  CO2 25  BUN 26*  CREATININE 1.65*  CALCIUM 9.1  GLUCOSE 242*    EKG: NSR, PVCs, LAFB  ASSESSMENT AND PLAN:  75 yo with complicated past history including RA and PMR, CAD, chronic diastolic CHF, atrial fibrillation (paroxysmal) and atrial flutter (s/p ablation) was admitted with presumed PNA.  1. Acute hypoxemic respiratory failure: Suspected PNA, on cefepime and vancomycin.  However, there is a concern based on chest CT for superimposed pulmonary edema.  On exam, he is actually not particularly volume overloaded.  His BNP is only 105. He had Lasix IV yesterday with good response but creatinine is up.  Given this picture, would also be concerned for amiodarone toxicity.  - Would not push diuresis hard => can give one more dose of IV Lasix 40 mg today, follow creatinine and clinical picture after that.  - Will send procalcitonin, if elevated would point more towards PNA.   If not elevated, would be concerned that this may be a presentation for amiodarone toxicity.  Would ask pulmonary to comment on this.  2. Chronic diastolic CHF: Please see discussion above.  Will give 1 dose IV Lasix today but would not push hard on diuresis as I do not think that he is markedly volume up and creatinine is elevated.  3. AKI: Mild rise in creatinine.  Give one dose IV Lasix 40 mg now then no more.   4. Atrial flutter: Remains in NSR on warfarin and amiodarone.  As above, must consider amiodarone lung toxicity for this clinical picture.  Would ask pulmonary to comment.  5. CAD: No evidence for ischemia.    Loralie Champagne 12/26/2015 2:10 PM

## 2015-12-26 NOTE — Progress Notes (Signed)
PROGRESS NOTE    Curtis Frazier  [id number] DOB: [birth year] DOA: 12/22/2015 PCP: Loura Pardon, MD    Brief Narrative:  Patient is a 21 year gentleman history of hyperlipidemia, diabetes, chronic kidney disease stage III, diastolic CHF, paroxysmal atrial fibrillation on Coumadin/atrial flutter status post recent CTI ablation per cardiology 12/15/2015 who presents to the ED with a 2 day history of cough, shortness of breath, chest pain pleuritic in nature with subjective fever and noted per chest x-ray to have a right middle and right lower lobe pneumonia. Patient's blood pressure noted to be borderline. Patient started empirically on IV vancomycin IV cefepime for healthcare associated pneumonia. During the hospitalization patient noted to be hypoxic requiring up to 10 L nasal, of oxygenation. CT angiogram chest done was negative for PE. Consent for acute on chronic diastolic heart failure. Blood pressure borderline. Patient on IV Lasix.   Assessment & Plan:   Principal Problem:   Acute respiratory failure with hypoxia (HCC) Active Problems:   Coronary atherosclerosis   Rheumatoid arthritis (HCC)   PMR (polymyalgia rheumatica) (HCC)   Back pain without radiation   Chronic diastolic congestive heart failure (HCC)   Paroxysmal atrial fibrillation (HCC)   Diabetes mellitus with complication (HCC)   Kidney disease, chronic, stage III (GFR 30-59 ml/min)   Atrial flutter (HCC)   Acute on chronic diastolic HF (heart failure) (HCC)   Atrial fibrillation with RVR (HCC)   Hyperlipidemia   Hypoxia   HCAP (healthcare-associated pneumonia)   Hyponatremia   GERD (gastroesophageal reflux disease)   Healthcare-associated pneumonia  #1 acute respiratory failure with hypoxia Patient noted to be hypoxic overnight for the past 3 days. Likely multifactorial secondary to healthcare associated pneumonia and acute on chronic diastolic heart failure. Patient currently on 8-9 L nasal cannula. Patient with  some bibasilar crackles on examination however no lower extremity edema. Patient does not look significantly short of breath and looks clinically better than noted on his sats. Patient's INR is subtherapeutic at this time. Chest x-ray done showed findings concerning for developing right upper lobe pneumonia on a background of chronic interstitial lung disease.  CT angiogram chest negative for PE, interlobular septal thickening with bilateral groundglass attenuation compatible with pulmonary edema although superimposed infectious/inflammatory etiologies not excluded. Bibasilar collapse/consolidation. Interval progression of mediastinal and right hilar lymphadenopathy nonspecific.Continue empiric IV antibiotics. Discontinue IV vancomycin. BNP trending down currently at 104.6 from 186.4. Patient given Lasix 40 mg IV 2 doses yesterday. Patient still requiring 9 L nasal cannula. Blood pressure borderline. Will give a dose of Lasix 20 mg IV every 12 hours 2 doses. Titrate to keep O2 between 88-94%. PCCM ff and appreciate input and rxcs.  #2 healthcare associated pneumonia Patient presented with subjective fever, shortness of breath, cough with chest x-ray findings consistent with a healthcare associated pneumonia in the right middle and right lower lobes. Titrate O2. Sputum Gram stain and culture pending. Urine Legionella antigen pending. Urine pneumococcus antigen negative. Patient noted to be hypoxic overnight and currently on nasal cannula at 8-9 L. Patient denies any significant shortness of breath. CT angiogram chest negative for PE, interlobular septal thickening with bilateral groundglass attenuation compatible with pulmonary edema although superimposed infectious/inflammatory etiologies not excluded. Bibasilar collapse/consolidation. Interval progression of mediastinal and right hilar lymphadenopathy nonspecific. Continue empiric IV cefepime, Mucinex DM. Discontinue IV vancomycin. Scheduled Pulmicort,  nebulizer treatments have been discontinued per pulmonary. Will give a dose of Lasix 20 mg IV 1. Follow.   #3 diabetes mellitus Hemoglobin A1c was  9.2 on 10/25/2015. Patient on chronic steroids. CBG has ranged from 196-324. Increase Levemir 10 mg daily. Continue sliding scale insulin.  #4 history of rheumatoid arthritis/PMR Stable. Patient was given a one-time dose of Solu-Cortef 50 minute grams IV 1. Continue prednisone 5 mg daily.  #5 acute on chronic diastolic CHF/coronary artery disease 2-D echo from 09/06/2015 with a EF of 55-60% with grade 1 diastolic dysfunction. Patient not on diuretics at home. Patient with + JVD, and bibasilar crackles. No lower extremity edema. Patient noted to be hypoxic overnight. Saline lock IV fluids. CT chest negative for PE, interlobular septal thickening with bilateral groundglass attenuation compatible with pulmonary edema although superimposed infectious/inflammatory etiologies not excluded. Bibasilar collapse/consolidation. Interval progression of mediastinal and right hilar lymphadenopathy nonspecific. Patient was given a dose of Lasix yesterday 40 mg IV 2 with urine output of 1.9 L. BNP trending down at 104.6 from 186.4 yesterday. Patient still hypoxic requiring increased amounts of O2. Will give a dose of Lasix 20 mg IV 1 due to borderline blood pressure. Monitor volume status closely.  #6 hyperlipidemia Continue statin.  #7 history of atrial flutter status post CTI ablation 12/15/2015/paroxysmal atrial fibrillation [id number] SCORE 5 Patient currently normal sinus rhythm with occasional PVCs. Patient asymptomatic. Continue diltiazem, amiodarone, Coumadin. INR is 1.89. Coumadin per pharmacy. Continue full dose Lovenox until INR is therapeutic due to recent ablation procedure. This was discussed with Dr. Rayann Heman EP. Outpatient follow-up with cardiology.  #8 gastroesophageal reflux disease PPI.  #9 pleuritic chest pain Secondary to problem #1.  Improving.  #10 COPD Stable. No wheezing noted. Scheduled Nebs discontinued per pulmonary. Continue albuterol nebulizers as needed.   #11 chronic kidney disease stage III Stable.  #10 hyponatremia TSH within normal limits. Serum osmolalities 288. Urine osmolality is 461. Urine sodium of 43. Cortisol level at 53.9. Continue diureses. Follow.   DVT prophylaxis: Lovenox, until Coumadin is therapeutic. Code Status: Full Family Communication: Updated patient. No family at bedside. Disposition Plan: Remain in step down unit.   Consultants:   PCCM: Dr Halford Chessman 12/25/2015  Procedures:   Chest x-ray 12/22/2015, 12/24/2015  CT angio chest 12/25/2015  Antimicrobials:   IV vancomycin 12/22/2015>>>>>12/26/2015  IV cefepime 12/22/2015   Subjective: Patient states cough improving. SOB improving. CP improved. Patient states feeling better overall. Patient noted to be hypoxic overnight and currently on 8-9L [place]. Patient noted to desat in 80s when sleeping overnight.  Objective: Filed Vitals:   12/26/15 0200 12/26/15 0400 12/26/15 0553 12/26/15 0800  BP: 113/62 122/58  99/62  Pulse: 76 84  84  Temp:  99.2 F (37.3 C)  98.5 F (36.9 C)  TempSrc:  Oral  Oral  Resp: 29 24  15   Height:      Weight:      SpO2: 90% 89% 92% 93%    Intake/Output Summary (Last 24 hours) at 12/26/15 0921 Last data filed at 12/26/15 0813  Gross per 24 hour  Intake   1750 ml  Output   1600 ml  Net    150 ml   Filed Weights   12/22/15 1729  Weight: 74.39 kg (164 lb)    Examination:  General exam: Appears calm and comfortable  Respiratory system: Bibasilar crackles. No wheezing. Cardiovascular system: S1 & S2 heard, RRR. + JVD. No murmurs, rubs, gallops or clicks. No pedal edema. Gastrointestinal system: Abdomen is nondistended, soft and nontender. No organomegaly or masses felt. Normal bowel sounds heard. Central nervous system: Alert and oriented. No focal neurological deficits. Extremities:  Symmetric  5 x 5 power. Skin: No rashes, lesions or ulcers.Right groin area with a bruise however no thrill noted, nontender to palpation, no warmth. Psychiatry: Judgement and insight appear normal. Mood & affect appropriate.     Data Reviewed: I have personally reviewed following labs and imaging studies  CBC:  Recent Labs Lab 12/22/15 1747 12/23/15 0955 12/24/15 0308 12/25/15 0355 12/26/15 0358  WBC 10.8* 10.7* 12.7* 11.5* 12.2*  NEUTROABS 8.1*  --   --   --  9.3*  HGB 12.4* 12.0* 11.6* 12.4* 11.3*  HCT 38.9* 37.6* 35.4* 38.6* 34.3*  MCV 91.3 90.6 90.3 91.0 90.0  PLT 277 272 285 315 [id number]   Basic Metabolic Panel:  Recent Labs Lab 12/22/15 1747 12/23/15 0955 12/24/15 0308 12/25/15 0355 12/26/15 0358  NA 129* 131* 133* 134* 129*  K 4.7 4.4 4.2 3.9 3.9  CL 97* 101 101 100* 95*  CO2 23 21* 23 25 25   GLUCOSE 278* 272* 112* 124* 242*  BUN 26* 22* 18 18 26*  CREATININE 1.62* 1.44* 1.19 1.46* 1.65*  CALCIUM 9.2 8.9 8.9 9.4 9.1  MG  --   --   --  1.4* 1.7   GFR: Estimated Creatinine Clearance: 36.8 mL/min (by C-G formula based on Cr of 1.65). Liver Function Tests: No results for input(s): AST, ALT, ALKPHOS, BILITOT, PROT, ALBUMIN in the last 168 hours. No results for input(s): LIPASE, AMYLASE in the last 168 hours. No results for input(s): AMMONIA in the last 168 hours. Coagulation Profile:  Recent Labs Lab 12/22/15 2354 12/23/15 0434 12/24/15 0308 12/25/15 0355 12/26/15 0358  INR 2.29* 2.18* 1.91* 1.60* 1.89*   Cardiac Enzymes: No results for input(s): CKTOTAL, CKMB, CKMBINDEX, TROPONINI in the last 168 hours. BNP (last 3 results) No results for input(s): PROBNP in the last 8760 hours. HbA1C: No results for input(s): HGBA1C in the last 72 hours. CBG:  Recent Labs Lab 12/25/15 0824 12/25/15 1308 12/25/15 1700 12/25/15 2135 12/26/15 0806  GLUCAP 185* 256* 324* 196* 235*   Lipid Profile: No results for input(s): CHOL, HDL, LDLCALC, TRIG, CHOLHDL,  LDLDIRECT in the last 72 hours. Thyroid Function Tests: No results for input(s): TSH, T4TOTAL, FREET4, T3FREE, THYROIDAB in the last 72 hours. Anemia Panel: No results for input(s): VITAMINB12, FOLATE, FERRITIN, TIBC, IRON, RETICCTPCT in the last 72 hours. Sepsis Labs:  Recent Labs Lab 12/22/15 2309  LATICACIDVEN 0.95    Recent Results (from the past 240 hour(s))  Blood culture (routine x 2)     Status: None (Preliminary result)   Collection Time: 12/22/15 10:55 PM  Result Value Ref Range Status   Specimen Description BLOOD RIGHT ANTECUBITAL  Final   Special Requests BOTTLES DRAWN AEROBIC AND ANAEROBIC 5CC  Final   Culture NO GROWTH 2 DAYS  Final   Report Status PENDING  Incomplete  Blood culture (routine x 2)     Status: None (Preliminary result)   Collection Time: 12/22/15 11:00 PM  Result Value Ref Range Status   Specimen Description BLOOD RIGHT HAND  Final   Special Requests IN PEDIATRIC BOTTLE 2CC  Final   Culture NO GROWTH 2 DAYS  Final   Report Status PENDING  Incomplete  MRSA PCR Screening     Status: None   Collection Time: 12/23/15  7:25 AM  Result Value Ref Range Status   MRSA by PCR NEGATIVE NEGATIVE Final    Comment:        The GeneXpert MRSA Assay (FDA approved for NASAL specimens only), is one component  of a comprehensive MRSA colonization surveillance program. It is not intended to diagnose MRSA infection nor to guide or monitor treatment for MRSA infections.   Culture, sputum-assessment     Status: None   Collection Time: 12/23/15  5:15 PM  Result Value Ref Range Status   Specimen Description SPUTUM  Final   Special Requests NONE  Final   Sputum evaluation   Final    MICROSCOPIC FINDINGS SUGGEST THAT THIS SPECIMEN IS NOT REPRESENTATIVE OF LOWER RESPIRATORY SECRETIONS. PLEASE RECOLLECT. Deloris Ping RN [id number] 12/23/15 A BROWNING    Report Status 12/23/2015 FINAL  Final  Gram stain     Status: None   Collection Time: 12/24/15 11:25 PM    Result Value Ref Range Status   Specimen Description EXPECTORATED SPUTUM  Final   Special Requests NONE  Final   Gram Stain   Final    MODERATE WBC PRESENT,BOTH PMN AND MONONUCLEAR RARE SQUAMOUS EPITHELIAL CELLS PRESENT FEW GRAM POSITIVE COCCI FEW GRAM VARIABLE ROD    Report Status 12/25/2015 FINAL  Final  Respiratory Panel by PCR     Status: None   Collection Time: 12/25/15  9:45 AM  Result Value Ref Range Status   Adenovirus NOT DETECTED NOT DETECTED Final   Coronavirus 229E NOT DETECTED NOT DETECTED Final   Coronavirus HKU1 NOT DETECTED NOT DETECTED Final   Coronavirus NL63 NOT DETECTED NOT DETECTED Final   Coronavirus OC43 NOT DETECTED NOT DETECTED Final   Metapneumovirus NOT DETECTED NOT DETECTED Final   Rhinovirus / Enterovirus NOT DETECTED NOT DETECTED Final   Influenza A NOT DETECTED NOT DETECTED Final   Influenza A H1 NOT DETECTED NOT DETECTED Final   Influenza A H1 2009 NOT DETECTED NOT DETECTED Final   Influenza A H3 NOT DETECTED NOT DETECTED Final   Influenza B NOT DETECTED NOT DETECTED Final   Parainfluenza Virus 1 NOT DETECTED NOT DETECTED Final   Parainfluenza Virus 2 NOT DETECTED NOT DETECTED Final   Parainfluenza Virus 3 NOT DETECTED NOT DETECTED Final   Parainfluenza Virus 4 NOT DETECTED NOT DETECTED Final   Respiratory Syncytial Virus NOT DETECTED NOT DETECTED Final   Bordetella pertussis NOT DETECTED NOT DETECTED Final   Chlamydophila pneumoniae NOT DETECTED NOT DETECTED Final   Mycoplasma pneumoniae NOT DETECTED NOT DETECTED Final         Radiology Studies: Ct Angio Chest Pe W Or Wo Contrast  12/25/2015  CLINICAL DATA:  Shortness of breath and hypoxia. EXAM: CT ANGIOGRAPHY CHEST WITH CONTRAST TECHNIQUE: Multidetector CT imaging of the chest was performed using the standard protocol during bolus administration of intravenous contrast. Multiplanar CT image reconstructions and MIPs were obtained to evaluate the vascular anatomy. CONTRAST:  100 cc  Isovue-300 COMPARISON:  09/06/2015. FINDINGS: Mediastinum / Lymph Nodes: There is no axillary lymphadenopathy. Stable 15 mm left thyroid nodule. Mediastinal lymphadenopathy is progressed in the interval. 8 mm short axis prevascular lymph node. 14 mm short axis precarinal lymph node on today's study measured only 10 mm short axis previously. 18 mm short axis right hilar lymph node is associated with a 14 mm short axis infrahilar right lymph node. Heart is enlarged. Coronary artery calcification is noted. The esophagus has normal imaging features. No evidence for filling defect in the opacified pulmonary arteries to suggest the presence of an acute pulmonary embolus. Lungs / Pleura: Interlobular septal thickening is associated with bilateral patchy ground-glass attenuation superimposed on emphysema. More focal airspace collapse/consolidation is identified in the lower lobes bilaterally. Upper Abdomen:  Unremarkable. MSK / Soft Tissues:  Incomplete visualization thoracolumbar fusion. Review of the MIP images confirms the above findings. IMPRESSION: 1. No CT evidence for acute pulmonary embolus. 2. Interlobular septal thickening with bilateral ground-glass attenuation. Imaging features are compatible with pulmonary edema although superimposed infectious/inflammatory etiology is not excluded. Bibasilar collapse/ consolidation. 3. Interval progression of mediastinal and right hilar lymphadenopathy, nonspecific. Underlying neoplasm not excluded and PET-CT may prove helpful to further evaluate. Electronically Signed   By: Misty Stanley M.D.   On: 12/25/2015 13:28        Scheduled Meds: . amiodarone  200 mg Oral BID  . calcium-vitamin D  1 tablet Oral Q breakfast  . ceFEPime (MAXIPIME) IV  1 g Intravenous Q24H  . dextromethorphan-guaiFENesin  2 tablet Oral BID  . diltiazem  240 mg Oral Daily  . docusate sodium  100 mg Oral BID  . enoxaparin (LOVENOX) injection  75 mg Subcutaneous Q12H  . ferrous sulfate  325  mg Oral TID WC  . insulin aspart  0-5 Units Subcutaneous QHS  . insulin aspart  0-9 Units Subcutaneous TID WC  . insulin detemir  5 Units Subcutaneous Daily  . magnesium sulfate 1 - 4 g bolus IVPB  4 g Intravenous Once  . pantoprazole  40 mg Oral Daily  . pravastatin  20 mg Oral QPM  . predniSONE  5 mg Oral Q breakfast  . thiamine  100 mg Oral Daily  . warfarin  4 mg Oral ONCE-1800  . Warfarin - Pharmacist Dosing Inpatient   Does not apply q1800   Continuous Infusions:     LOS: 3 days    Time spent: 20 mins    Kalayah Leske, MD [facility] Pager [phone]  If 7PM-7AM, please contact night-coverage [email or web address] Password [email or web address] 12/26/2015, 9:21 AM

## 2015-12-26 NOTE — Progress Notes (Signed)
PULMONARY / CRITICAL CARE MEDICINE   Name: Curtis Frazier MRN: [id number] DOB: 30-Jul-1940    ADMISSION DATE:  12/22/2015 CONSULTATION DATE:  12/25/2015  REFERRING MD:  Dr. Grandville Silos, [facility]  CHIEF COMPLAINT:  Short of breath  HISTORY OF PRESENT ILLNESS:   75 yo male was in hospital 7/05 for cardiac ablation with hx of a flutter.  He developed cough, chest congestion and progressive dyspnea.  Noted to have hypoxia.  CXR showed Rt sided PNA.  He was started on ABx for HCAP.  He reports subjective fevers.  He has chest discomfort, but better now.  Still coughing up clear sputum.  Denies abdominal pain, sinus congestion, sore throat, headache, diarrhea, or skin rash.  He has hx of rheumatoid arthritis and has been on remicade most recently >> followed by Dr. Amil Amen with rheumatology.  SUBJECTIVE:  Feeling some better.  Still requiring 5-6L .  Still has cough, occasionally productive of white sputum.   VITAL SIGNS: BP 99/62 mmHg  Pulse 84  Temp(Src) 98.5 F (36.9 C) (Oral)  Resp 15  Ht 5\' 8"  (1.727 m)  Wt 74.39 kg (164 lb)  BMI 24.94 kg/m2  SpO2 93%  INTAKE / OUTPUT: I/O last 3 completed shifts: In: 1970 [P.O.:1320; Other:100; IV Piggyback:550] Out: 2600 [Urine:2600]  PHYSICAL EXAMINATION: General:  Alert, NAD sitting OOB in chair.  Neuro:  Normal strength HEENT:  No sinus tenderness, no oral exudate, no LAN Cardiovascular:  Regular, no murmur Lungs:  resps even non labored on , few bibasilar crackles, no wheeze Abdomen:  Soft, non tender Musculoskeletal:  No edema Skin:  No rashes  LABS:  BMET  Recent Labs Lab 12/24/15 0308 12/25/15 0355 12/26/15 0358  NA 133* 134* 129*  K 4.2 3.9 3.9  CL 101 100* 95*  CO2 23 25 25   BUN 18 18 26*  CREATININE 1.19 1.46* 1.65*  GLUCOSE 112* 124* 242*    Electrolytes  Recent Labs Lab 12/24/15 0308 12/25/15 0355 12/26/15 0358  CALCIUM 8.9 9.4 9.1  MG  --  1.4* 1.7    CBC  Recent Labs Lab 12/24/15 0308  12/25/15 0355 12/26/15 0358  WBC 12.7* 11.5* 12.2*  HGB 11.6* 12.4* 11.3*  HCT 35.4* 38.6* 34.3*  PLT 285 315 317    Coag's  Recent Labs Lab 12/24/15 0308 12/25/15 0355 12/26/15 0358  INR 1.91* 1.60* 1.89*    Sepsis Markers  Recent Labs Lab 12/22/15 2309  LATICACIDVEN 0.95   Glucose  Recent Labs Lab 12/24/15 2208 12/25/15 0824 12/25/15 1308 12/25/15 1700 12/25/15 2135 12/26/15 0806  GLUCAP 180* 185* 256* 324* 196* 235*    Imaging Ct Angio Chest Pe W Or Wo Contrast  12/25/2015  CLINICAL DATA:  Shortness of breath and hypoxia. EXAM: CT ANGIOGRAPHY CHEST WITH CONTRAST TECHNIQUE: Multidetector CT imaging of the chest was performed using the standard protocol during bolus administration of intravenous contrast. Multiplanar CT image reconstructions and MIPs were obtained to evaluate the vascular anatomy. CONTRAST:  100 cc Isovue-300 COMPARISON:  09/06/2015. FINDINGS: Mediastinum / Lymph Nodes: There is no axillary lymphadenopathy. Stable 15 mm left thyroid nodule. Mediastinal lymphadenopathy is progressed in the interval. 8 mm short axis prevascular lymph node. 14 mm short axis precarinal lymph node on today's study measured only 10 mm short axis previously. 18 mm short axis right hilar lymph node is associated with a 14 mm short axis infrahilar right lymph node. Heart is enlarged. Coronary artery calcification is noted. The esophagus has normal imaging features. No evidence for  filling defect in the opacified pulmonary arteries to suggest the presence of an acute pulmonary embolus. Lungs / Pleura: Interlobular septal thickening is associated with bilateral patchy ground-glass attenuation superimposed on emphysema. More focal airspace collapse/consolidation is identified in the lower lobes bilaterally. Upper Abdomen:  Unremarkable. MSK / Soft Tissues:  Incomplete visualization thoracolumbar fusion. Review of the MIP images confirms the above findings. IMPRESSION: 1. No CT evidence  for acute pulmonary embolus. 2. Interlobular septal thickening with bilateral ground-glass attenuation. Imaging features are compatible with pulmonary edema although superimposed infectious/inflammatory etiology is not excluded. Bibasilar collapse/ consolidation. 3. Interval progression of mediastinal and right hilar lymphadenopathy, nonspecific. Underlying neoplasm not excluded and PET-CT may prove helpful to further evaluate. Electronically Signed   By: Misty Stanley M.D.   On: 12/25/2015 13:28    STUDIES:  7/15 CT angio chest >>1. No CT evidence for acute pulmonary embolus. 2. Interlobular septal thickening with bilateral ground-glass attenuation. Imaging features are compatible with pulmonary edema although superimposed infectious/inflammatory etiology is not excluded. Bibasilar collapse/ consolidation. 3. Interval progression of mediastinal and right hilar lymphadenopathy, nonspecific. Underlying neoplasm not excluded and PET-CT may prove helpful to further evaluate.  CULTURES: 7/12 Blood >> 7/15 RSV >> NEG 7/14 sputum>>>   ANTIBIOTICS: 7/12 Vancomycin >> 7/12 Cefepime>>>  SIGNIFICANT EVENTS: 7/12 Admit   DISCUSSION: 75 yo male with acute hypoxic respiratory failure from HCAP.  He has hx of RA on remicade and low dose prednisone as outpt, reported hx of COPD, diastolic CHF.  ASSESSMENT / PLAN:  Acute hypoxic respiratory failure. - oxygen to keep SpO2 > 92% - lasix as below   HCAP.- RVP negative  - continue Abx - aggressive pulm hygiene   Acute on chronic diastolic CHF. - lasix -f/u BNP   Reported hx of COPD >> doesn't feel nebulizer therapy helps. - change BDs to prn - no role steroids at this time    Nickolas Madrid, NP 12/26/2015  12:00 PM Pager: [phone] or [phone]  Attending note: Breathing better.  O2 needs improved.  Still has cough.  Denies chest pain.  Sitting in chair.  No wheeze.  Abd soft.  HR regular.  CT chest - PNA,  CHF  Assessment/plan: HCAP. - continue Abx  A on C CHF. - continue lasix  Hypoxia. - adjust oxygen to keep Spo2 > 92%  Updated pt's family at bedside.  Chesley Mires, MD [facility] Pulmonary/Critical Care 12/26/2015, 12:27 PM Pager:  [phone] After 3pm call: [phone]

## 2015-12-26 NOTE — Discharge Instructions (Addendum)

## 2015-12-27 ENCOUNTER — Telehealth: Payer: Self-pay | Admitting: Family Medicine

## 2015-12-27 DIAGNOSIS — I4891 Unspecified atrial fibrillation: Secondary | ICD-10-CM

## 2015-12-27 LAB — PROTIME-INR
INR: 2.37 — AB (ref 0.00–1.49)
Prothrombin Time: 25.7 seconds — ABNORMAL HIGH (ref 11.6–15.2)

## 2015-12-27 LAB — GLUCOSE, CAPILLARY
GLUCOSE-CAPILLARY: 221 mg/dL — AB (ref 65–99)
GLUCOSE-CAPILLARY: 167 mg/dL — AB (ref 65–99)
Glucose-Capillary: 346 mg/dL — ABNORMAL HIGH (ref 65–99)
Glucose-Capillary: 198 mg/dL — ABNORMAL HIGH (ref 65–99)

## 2015-12-27 LAB — BASIC METABOLIC PANEL
Anion gap: 8 (ref 5–15)
BUN: 32 mg/dL — AB (ref 6–20)
CALCIUM: 9.2 mg/dL (ref 8.9–10.3)
CHLORIDE: 96 mmol/L — AB (ref 101–111)
CO2: 25 mmol/L (ref 22–32)
CREATININE: 1.84 mg/dL — AB (ref 0.61–1.24)
GFR calc non Af Amer: 34 mL/min — ABNORMAL LOW (ref >60)
GFR, EST AFRICAN AMERICAN: 39 mL/min — AB (ref >60)
GLUCOSE: 185 mg/dL — AB (ref 65–99)
Potassium: 4 mmol/L (ref 3.5–5.1)
Sodium: 129 mmol/L — ABNORMAL LOW (ref 135–145)

## 2015-12-27 LAB — LEGIONELLA PNEUMOPHILA SEROGP 1 UR AG: L. PNEUMOPHILA SEROGP 1 UR AG: NEGATIVE

## 2015-12-27 LAB — CBC
HEMATOCRIT: 36.1 % — AB (ref 39.0–52.0)
HEMOGLOBIN: 11.6 g/dL — AB (ref 13.0–17.0)
MCH: 28.9 pg (ref 26.0–34.0)
MCHC: 32.1 g/dL (ref 30.0–36.0)
MCV: 89.8 fL (ref 78.0–100.0)
Platelets: 339 10*3/uL (ref 150–400)
RBC: 4.02 MIL/uL — ABNORMAL LOW (ref 4.22–5.81)
RDW: 17 % — AB (ref 11.5–15.5)
WBC: 11.5 10*3/uL — ABNORMAL HIGH (ref 4.0–10.5)

## 2015-12-27 MED ORDER — INSULIN DETEMIR 100 UNIT/ML ~~LOC~~ SOLN
15.0000 [IU] | Freq: Every day | SUBCUTANEOUS | Status: DC
  Administered 2015-12-27 – 2016-01-01 (×6): 15 [IU] via SUBCUTANEOUS
  Filled 2015-12-27 (×6): qty 0.15

## 2015-12-27 MED ORDER — INSULIN ASPART 100 UNIT/ML ~~LOC~~ SOLN
0.0000 [IU] | Freq: Three times a day (TID) | SUBCUTANEOUS | Status: DC
  Administered 2015-12-27: 11 [IU] via SUBCUTANEOUS
  Administered 2015-12-27: 3 [IU] via SUBCUTANEOUS
  Administered 2015-12-28: 2 [IU] via SUBCUTANEOUS
  Administered 2015-12-28: 5 [IU] via SUBCUTANEOUS
  Administered 2015-12-28: 3 [IU] via SUBCUTANEOUS
  Administered 2015-12-29: 5 [IU] via SUBCUTANEOUS
  Administered 2015-12-29 – 2016-01-01 (×9): 3 [IU] via SUBCUTANEOUS
  Administered 2016-01-01: 5 [IU] via SUBCUTANEOUS

## 2015-12-27 MED ORDER — INSULIN ASPART 100 UNIT/ML ~~LOC~~ SOLN
3.0000 [IU] | Freq: Three times a day (TID) | SUBCUTANEOUS | Status: DC
  Administered 2015-12-27 – 2016-01-01 (×16): 3 [IU] via SUBCUTANEOUS

## 2015-12-27 MED ORDER — WARFARIN SODIUM 5 MG PO TABS
2.5000 mg | ORAL_TABLET | Freq: Once | ORAL | Status: AC
  Administered 2015-12-27: 2.5 mg via ORAL
  Filled 2015-12-27: qty 1

## 2015-12-27 MED ORDER — INSULIN ASPART 100 UNIT/ML ~~LOC~~ SOLN
0.0000 [IU] | Freq: Every day | SUBCUTANEOUS | Status: DC
  Administered 2015-12-28: 2 [IU] via SUBCUTANEOUS

## 2015-12-27 MED ORDER — LEVOFLOXACIN 750 MG PO TABS
750.0000 mg | ORAL_TABLET | ORAL | Status: DC
  Administered 2015-12-27 – 2015-12-29 (×2): 750 mg via ORAL
  Filled 2015-12-27 (×2): qty 1

## 2015-12-27 MED ORDER — IOPAMIDOL (ISOVUE-370) INJECTION 76%
100.0000 mL | Freq: Once | INTRAVENOUS | Status: AC | PRN
  Administered 2015-12-25: 100 mL via INTRAVENOUS

## 2015-12-27 NOTE — Progress Notes (Signed)
PROGRESS NOTE    Curtis Frazier  [id number] DOB: 1941-03-19 DOA: 12/22/2015 PCP: Loura Pardon, MD    Brief Narrative:  Patient is a 89 year gentleman history of hyperlipidemia, diabetes, chronic kidney disease stage III, diastolic CHF, paroxysmal atrial fibrillation on Coumadin/atrial flutter status post recent CTI ablation per cardiology 12/15/2015 who presents to the ED with a 2 day history of cough, shortness of breath, chest pain pleuritic in nature with subjective fever and noted per chest x-ray to have a right middle and right lower lobe pneumonia. Patient's blood pressure noted to be borderline. Patient started empirically on IV vancomycin IV cefepime for healthcare associated pneumonia. During the hospitalization patient noted to be hypoxic requiring up to 10 L nasal, of oxygenation. CT angiogram chest done was negative for PE. Consent for acute on chronic diastolic heart failure. Blood pressure borderline. Patient on IV Lasix.   Assessment & Plan:   Principal Problem:   Acute respiratory failure with hypoxia (HCC) Active Problems:   Coronary atherosclerosis   Rheumatoid arthritis (HCC)   PMR (polymyalgia rheumatica) (HCC)   Back pain without radiation   Chronic diastolic congestive heart failure (HCC)   Paroxysmal atrial fibrillation (HCC)   Diabetes mellitus with complication (HCC)   Kidney disease, chronic, stage III (GFR 30-59 ml/min)   Atrial flutter (HCC)   Acute on chronic diastolic HF (heart failure) (HCC)   Atrial fibrillation with RVR (HCC)   Hyperlipidemia   Hypoxia   HCAP (healthcare-associated pneumonia)   Hyponatremia   GERD (gastroesophageal reflux disease)   Healthcare-associated pneumonia  #1 acute respiratory failure with hypoxia Patient noted to be hypoxic overnight for the past 4 days. Likely multifactorial secondary to healthcare associated pneumonia and acute on chronic diastolic heart failure and ?? Amiodarone toxicity versus secondary to probe  placement. Patient currently 2L nasal cannula are 97-100%. Patient does not look volume overloaded. Patient does not look significantly short of breath and looks clinically better than noted on his sats. Patient's INR was subtherapeutic and currently now therapeutic. Chest x-ray done showed findings concerning for developing right upper lobe pneumonia on a background of chronic interstitial lung disease.  CT angiogram chest negative for PE, interlobular septal thickening with bilateral groundglass attenuation compatible with pulmonary edema although superimposed infectious/inflammatory etiologies not excluded. Bibasilar collapse/consolidation. Interval progression of mediastinal and right hilar lymphadenopathy nonspecific.Continue empiric IV antibiotics. Discontinue IV vancomycin. BNP trending down currently at 104.6 from 186.4. Patient given multiple doses of Lasix over the past 2-3 days with good urine output. Patient is -2.5 L during this hospitalization. Patient now on the requiring 2 L nasal cannula. No further diuretics will be given per cardiology recommendations. Amiodarone has been discontinued per cardiology due to concerns for possible amiodarone toxicity. PCCM and cardiology following and appreciate input and recommendations. Titrate to keep O2 between 88-94%. PCCM ff and appreciate input and rxcs.  #2 healthcare associated pneumonia Patient presented with subjective fever, shortness of breath, cough with chest x-ray findings consistent with a healthcare associated pneumonia in the right middle and right lower lobes. Titrate O2. Sputum Gram stain and culture pending. Urine Legionella antigen pending. Urine pneumococcus antigen negative. Patient noted to be hypoxic overnight and currently on nasal cannula at 8-9 L. Patient denies any significant shortness of breath. CT angiogram chest negative for PE, interlobular septal thickening with bilateral groundglass attenuation compatible with pulmonary edema  although superimposed infectious/inflammatory etiologies not excluded. Bibasilar collapse/consolidation. Interval progression of mediastinal and right hilar lymphadenopathy nonspecific. Continue Mucinex DM. Discontinued  IV vancomycin. Discontinue IV cefepime and transition to oral Levaquin. Scheduled Pulmicort, nebulizer treatments have been discontinued per pulmonary.  Follow.   #3 diabetes mellitus Hemoglobin A1c was 9.2 on 10/25/2015. Patient on chronic steroids. CBG has ranged from 221-329. Increase Levemir 15 mg daily. Continue sliding scale insulin. Add meal coverage insulin NovoLog 3 units daily.  #4 history of rheumatoid arthritis/PMR Stable. Patient was given a one-time dose of Solu-Cortef 50 minute grams IV 1. Continue prednisone 5 mg daily.  #5 acute on chronic diastolic CHF/coronary artery disease 2-D echo from 09/06/2015 with a EF of 55-60% with grade 1 diastolic dysfunction. Patient not on diuretics at home. Patient with no JVD. No lower extremity edema. Patient hypoxia seems to be improving patient has been weaned down to 2 L nasal cannula. Saline lock IV fluids. CT chest negative for PE, interlobular septal thickening with bilateral groundglass attenuation compatible with pulmonary edema although superimposed infectious/inflammatory etiologies not excluded. Bibasilar collapse/consolidation. Interval progression of mediastinal and right hilar lymphadenopathy nonspecific. Patient was given a doses of Lasix over the past 2 days. BNP trending down at 104.6 from 186.4. Hypoxia improved. Patient seems to compensated/euvolemic. Will hold on any further diuresis per cardiology recommendations. Patient had 2.4 L urine output yesterday and is -2.5 L during this hospitalization. Amiodarone has been discontinued per cardiology. Cardiology following and appreciate input and recommendations.   #6 hyperlipidemia Continue statin.  #7 history of atrial flutter status post CTI ablation  12/15/2015/paroxysmal atrial fibrillation CHA2DS2VASC SCORE 5 Patient currently normal sinus rhythm with occasional PVCs. Patient asymptomatic. Continue diltiazem, Coumadin. INR is 2.37. Coumadin per pharmacy. Discontinue Lovenox as INR is therapeutic. Amiodarone has been discontinued per cardiology due to concerns for possible pulmonary toxicity. Patient was bridged with Lovenox secondary to recent ablation. This was discussed with Dr. Rayann Heman EP. Outpatient follow-up with cardiology.  #8 gastroesophageal reflux disease PPI.  #9 pleuritic chest pain Secondary to problem #1. Improving.  #10 COPD Stable. No wheezing noted. Scheduled Nebs discontinued per pulmonary. Continue albuterol nebulizers as needed.   #11 chronic kidney disease stage III Stable.  #10 hyponatremia TSH within normal limits. Serum osmolalities 288. Urine osmolality is 461. Urine sodium of 43. Cortisol level at 53.9. Follow.   DVT prophylaxis: Lovenox, until Coumadin is therapeutic. Code Status: Full Family Communication: Updated patient. No family at bedside. Disposition Plan: Remain in step down unit.   Consultants:   PCCM: Dr Halford Chessman 12/25/2015  Cardiology: Dr. Aundra Dubin 12/26/2015  Procedures:   Chest x-ray 12/22/2015, 12/24/2015  CT angio chest 12/25/2015  Antimicrobials:   IV vancomycin 12/22/2015>>>>>12/26/2015  IV cefepime 12/22/2015>>>>> 12/27/2015  Oral Levaquin 12/27/2015   Subjective: Patient sitting up in chair with no complaints. States he's feeling better. No chest pain. No shortness of breath. Patient on 2 L [place] with sats 97-100%.  Objective: Filed Vitals:   12/27/15 0000 12/27/15 0200 12/27/15 0341 12/27/15 0752  BP: 111/57 118/64 100/56 129/69  Pulse: 80 85 83 92  Temp: 98.4 F (36.9 C)  98.6 F (37 C) 99.7 F (37.6 C)  TempSrc: Oral  Oral Oral  Resp: 25 19 23 18   Height:      Weight:      SpO2: 100% 98% 97% 95%    Intake/Output Summary (Last 24 hours) at 12/27/15 1109 Last  data filed at 12/27/15 0900  Gross per 24 hour  Intake    890 ml  Output   2400 ml  Net  -1510 ml   Filed Weights   12/22/15 1729  Weight: 74.39 kg (164 lb)    Examination:  General exam: Appears calm and comfortable. Sitting up in chair. Respiratory system: CTAB Cardiovascular system: S1 & S2 heard, RRR. No JVD. No murmurs, rubs, gallops or clicks. No pedal edema. Gastrointestinal system: Abdomen is nondistended, soft and nontender. No organomegaly or masses felt. Normal bowel sounds heard. Central nervous system: Alert and oriented. No focal neurological deficits. Extremities: Symmetric 5 x 5 power. Skin: No rashes, lesions or ulcers.Right groin area with a bruise however no thrill noted, nontender to palpation, no warmth. Psychiatry: Judgement and insight appear normal. Mood & affect appropriate.     Data Reviewed: I have personally reviewed following labs and imaging studies  CBC:  Recent Labs Lab 12/22/15 1747 12/23/15 0955 12/24/15 0308 12/25/15 0355 12/26/15 0358 12/27/15 0440  WBC 10.8* 10.7* 12.7* 11.5* 12.2* 11.5*  NEUTROABS 8.1*  --   --   --  9.3*  --   HGB 12.4* 12.0* 11.6* 12.4* 11.3* 11.6*  HCT 38.9* 37.6* 35.4* 38.6* 34.3* 36.1*  MCV 91.3 90.6 90.3 91.0 90.0 89.8  PLT 277 272 285 315 317 [id number]   Basic Metabolic Panel:  Recent Labs Lab 12/23/15 0955 12/24/15 0308 12/25/15 0355 12/26/15 0358 12/27/15 0440  NA 131* 133* 134* 129* 129*  K 4.4 4.2 3.9 3.9 4.0  CL 101 101 100* 95* 96*  CO2 21* 23 25 25 25   GLUCOSE 272* 112* 124* 242* 185*  BUN 22* 18 18 26* 32*  CREATININE 1.44* 1.19 1.46* 1.65* 1.84*  CALCIUM 8.9 8.9 9.4 9.1 9.2  MG  --   --  1.4* 1.7  --    GFR: Estimated Creatinine Clearance: 33 mL/min (by C-G formula based on Cr of 1.84). Liver Function Tests: No results for input(s): AST, ALT, ALKPHOS, BILITOT, PROT, ALBUMIN in the last 168 hours. No results for input(s): LIPASE, AMYLASE in the last 168 hours. No results for input(s):  AMMONIA in the last 168 hours. Coagulation Profile:  Recent Labs Lab 12/23/15 0434 12/24/15 0308 12/25/15 0355 12/26/15 0358 12/27/15 0440  INR 2.18* 1.91* 1.60* 1.89* 2.37*   Cardiac Enzymes: No results for input(s): CKTOTAL, CKMB, CKMBINDEX, TROPONINI in the last 168 hours. BNP (last 3 results) No results for input(s): PROBNP in the last 8760 hours. HbA1C: No results for input(s): HGBA1C in the last 72 hours. CBG:  Recent Labs Lab 12/26/15 0806 12/26/15 1254 12/26/15 1705 12/26/15 1959 12/27/15 0750  GLUCAP 235* 263* 329* 329* 221*   Lipid Profile: No results for input(s): CHOL, HDL, LDLCALC, TRIG, CHOLHDL, LDLDIRECT in the last 72 hours. Thyroid Function Tests: No results for input(s): TSH, T4TOTAL, FREET4, T3FREE, THYROIDAB in the last 72 hours. Anemia Panel: No results for input(s): VITAMINB12, FOLATE, FERRITIN, TIBC, IRON, RETICCTPCT in the last 72 hours. Sepsis Labs:  Recent Labs Lab 12/22/15 2309 12/26/15 1356  PROCALCITON  --  0.43  LATICACIDVEN 0.95  --     Recent Results (from the past 240 hour(s))  Blood culture (routine x 2)     Status: None (Preliminary result)   Collection Time: 12/22/15 10:55 PM  Result Value Ref Range Status   Specimen Description BLOOD RIGHT ANTECUBITAL  Final   Special Requests BOTTLES DRAWN AEROBIC AND ANAEROBIC 5CC  Final   Culture NO GROWTH 3 DAYS  Final   Report Status PENDING  Incomplete  Blood culture (routine x 2)     Status: None (Preliminary result)   Collection Time: 12/22/15 11:00 PM  Result Value Ref Range Status  Specimen Description BLOOD RIGHT HAND  Final   Special Requests IN PEDIATRIC BOTTLE 2CC  Final   Culture NO GROWTH 3 DAYS  Final   Report Status PENDING  Incomplete  MRSA PCR Screening     Status: None   Collection Time: 12/23/15  7:25 AM  Result Value Ref Range Status   MRSA by PCR NEGATIVE NEGATIVE Final    Comment:        The GeneXpert MRSA Assay (FDA approved for NASAL specimens only), is  one component of a comprehensive MRSA colonization surveillance program. It is not intended to diagnose MRSA infection nor to guide or monitor treatment for MRSA infections.   Culture, sputum-assessment     Status: None   Collection Time: 12/23/15  5:15 PM  Result Value Ref Range Status   Specimen Description SPUTUM  Final   Special Requests NONE  Final   Sputum evaluation   Final    MICROSCOPIC FINDINGS SUGGEST THAT THIS SPECIMEN IS NOT REPRESENTATIVE OF LOWER RESPIRATORY SECRETIONS. PLEASE RECOLLECT. Deloris Ping RN [id number] 12/23/15 A BROWNING    Report Status 12/23/2015 FINAL  Final  Gram stain     Status: None   Collection Time: 12/24/15 11:25 PM  Result Value Ref Range Status   Specimen Description EXPECTORATED SPUTUM  Final   Special Requests NONE  Final   Gram Stain   Final    MODERATE WBC PRESENT,BOTH PMN AND MONONUCLEAR RARE SQUAMOUS EPITHELIAL CELLS PRESENT FEW GRAM POSITIVE COCCI FEW GRAM VARIABLE ROD    Report Status 12/25/2015 FINAL  Final  Respiratory Panel by PCR     Status: None   Collection Time: 12/25/15  9:45 AM  Result Value Ref Range Status   Adenovirus NOT DETECTED NOT DETECTED Final   Coronavirus 229E NOT DETECTED NOT DETECTED Final   Coronavirus HKU1 NOT DETECTED NOT DETECTED Final   Coronavirus NL63 NOT DETECTED NOT DETECTED Final   Coronavirus OC43 NOT DETECTED NOT DETECTED Final   Metapneumovirus NOT DETECTED NOT DETECTED Final   Rhinovirus / Enterovirus NOT DETECTED NOT DETECTED Final   Influenza A NOT DETECTED NOT DETECTED Final   Influenza A H1 NOT DETECTED NOT DETECTED Final   Influenza A H1 2009 NOT DETECTED NOT DETECTED Final   Influenza A H3 NOT DETECTED NOT DETECTED Final   Influenza B NOT DETECTED NOT DETECTED Final   Parainfluenza Virus 1 NOT DETECTED NOT DETECTED Final   Parainfluenza Virus 2 NOT DETECTED NOT DETECTED Final   Parainfluenza Virus 3 NOT DETECTED NOT DETECTED Final   Parainfluenza Virus 4 NOT DETECTED NOT  DETECTED Final   Respiratory Syncytial Virus NOT DETECTED NOT DETECTED Final   Bordetella pertussis NOT DETECTED NOT DETECTED Final   Chlamydophila pneumoniae NOT DETECTED NOT DETECTED Final   Mycoplasma pneumoniae NOT DETECTED NOT DETECTED Final         Radiology Studies: Ct Angio Chest Pe W Or Wo Contrast  12/25/2015  CLINICAL DATA:  Shortness of breath and hypoxia. EXAM: CT ANGIOGRAPHY CHEST WITH CONTRAST TECHNIQUE: Multidetector CT imaging of the chest was performed using the standard protocol during bolus administration of intravenous contrast. Multiplanar CT image reconstructions and MIPs were obtained to evaluate the vascular anatomy. CONTRAST:  100 cc Isovue-300 COMPARISON:  09/06/2015. FINDINGS: Mediastinum / Lymph Nodes: There is no axillary lymphadenopathy. Stable 15 mm left thyroid nodule. Mediastinal lymphadenopathy is progressed in the interval. 8 mm short axis prevascular lymph node. 14 mm short axis precarinal lymph node on today's study measured only  10 mm short axis previously. 18 mm short axis right hilar lymph node is associated with a 14 mm short axis infrahilar right lymph node. Heart is enlarged. Coronary artery calcification is noted. The esophagus has normal imaging features. No evidence for filling defect in the opacified pulmonary arteries to suggest the presence of an acute pulmonary embolus. Lungs / Pleura: Interlobular septal thickening is associated with bilateral patchy ground-glass attenuation superimposed on emphysema. More focal airspace collapse/consolidation is identified in the lower lobes bilaterally. Upper Abdomen:  Unremarkable. MSK / Soft Tissues:  Incomplete visualization thoracolumbar fusion. Review of the MIP images confirms the above findings. IMPRESSION: 1. No CT evidence for acute pulmonary embolus. 2. Interlobular septal thickening with bilateral ground-glass attenuation. Imaging features are compatible with pulmonary edema although superimposed  infectious/inflammatory etiology is not excluded. Bibasilar collapse/ consolidation. 3. Interval progression of mediastinal and right hilar lymphadenopathy, nonspecific. Underlying neoplasm not excluded and PET-CT may prove helpful to further evaluate. Electronically Signed   By: Misty Stanley M.D.   On: 12/25/2015 13:28        Scheduled Meds: . calcium-vitamin D  1 tablet Oral Q breakfast  . ceFEPime (MAXIPIME) IV  1 g Intravenous Q24H  . dextromethorphan-guaiFENesin  2 tablet Oral BID  . diltiazem  240 mg Oral Daily  . docusate sodium  100 mg Oral BID  . ferrous sulfate  325 mg Oral TID WC  . insulin aspart  0-15 Units Subcutaneous TID WC  . insulin aspart  0-5 Units Subcutaneous QHS  . insulin aspart  3 Units Subcutaneous TID WC  . insulin detemir  15 Units Subcutaneous Daily  . pantoprazole  40 mg Oral Daily  . pravastatin  20 mg Oral QPM  . predniSONE  5 mg Oral Q breakfast  . thiamine  100 mg Oral Daily  . warfarin  2.5 mg Oral ONCE-1800  . Warfarin - Pharmacist Dosing Inpatient   Does not apply q1800   Continuous Infusions:     LOS: 4 days    Time spent: 64 mins    Mady Oubre, MD [facility] Pager [phone]  If 7PM-7AM, please contact night-coverage [email or web address] Password [email or web address] 12/27/2015, 11:09 AM

## 2015-12-27 NOTE — Care Management Important Message (Signed)
Important Message  Patient Details  Name: Curtis Frazier MRN: [id number] Date of Birth: 1940/11/13   Medicare Important Message Given:  Yes    [name] May 12/27/2015, 2:53 PM

## 2015-12-27 NOTE — Telephone Encounter (Signed)
Please give her the ok for seeing pt that day I do not see a phone note from that date

## 2015-12-27 NOTE — Progress Notes (Addendum)
ANTICOAGULATION CONSULT NOTE - Follow Up Consult  Pharmacy Consult:  Coumadin/Lovenox Indication: atrial fibrillation  Allergies  Allergen Reactions  . Ace Inhibitors Other (See Comments)    Causes high K   . Angiotensin Receptor Blockers Other (See Comments)    Causes high K   . Metoprolol Other (See Comments)    May cause elevated K level   . Penicillins Swelling and Rash    Has patient had a PCN reaction causing immediate rash, facial/tongue/throat swelling, SOB or lightheadedness with hypotension: YES Has patient had a PCN reaction causing severe rash involving mucus membranes or skin necrosis: NO Has patient had a PCN reaction that required hospitalization NO Has patient had a PCN reaction occurring within the last 10 years: NO If all of the above answers are "NO", then may proceed with Cephalosporin use. Pt has tolerated cephalosporins in the past   . Tetracycline Swelling and Rash    Patient Measurements: Height: 5\' 8"  (172.7 cm) Weight: 164 lb (74.39 kg) IBW/kg (Calculated) : 68.4  Vital Signs: Temp: 99.7 F (37.6 C) (07/17 0752) Temp Source: Oral (07/17 0752) BP: 129/69 mmHg (07/17 0752) Pulse Rate: 92 (07/17 0752)  Labs:  Recent Labs  12/25/15 0355 12/26/15 0358 12/27/15 0440  HGB 12.4* 11.3* 11.6*  HCT 38.6* 34.3* 36.1*  PLT 315 317 339  LABPROT 19.1* 21.7* 25.7*  INR 1.60* 1.89* 2.37*  CREATININE 1.46* 1.65* 1.84*    Estimated Creatinine Clearance: 33 mL/min (by C-G formula based on Cr of 1.84).    Assessment: 46 YOM continues on Coumadin from PTA for history of AFib.  INR dipped below therapeutic range 7/15 and Lovenox bridge added given recent ablation. INR trending back up today, 1.89>2.37; Hg low stable, plt wnl, no bleeding reported.  Home dose: 2.5mg  daily except 5mg  MWF  Goal of Therapy:  INR 2-3 Anti-Xa level 0.6-1 units/ml 4hrs after LMWH dose given    Plan:  - Coumadin 2.5mg  PO x 1 dose tonight - D/c Lovenox as INR  therapeutic - Daily PT / INR - Monitor s/sx bleeding - Consider resuming home med glipizide for better glycemic control   Elicia Lamp, PharmD, [facility] Clinical Pharmacist Pager [phone] 12/27/2015 8:03 AM

## 2015-12-27 NOTE — Progress Notes (Signed)
SATURATION QUALIFICATIONS: (This note is used to comply with regulatory documentation for home oxygen)  Patient Saturations on Room Air at Rest = 93%  Patient Saturations on Room Air while Ambulating = 86%  Patient Saturations on 2 Liters of oxygen while Ambulating = 91%  Please briefly explain why patient needs home oxygen: Pt ambulated on roomair and O2sat dropped to 86%

## 2015-12-27 NOTE — Progress Notes (Signed)
Physical Therapy Treatment Patient Details Name: Curtis Frazier MRN: [id number] DOB: 08/22/1940 Today's Date: 12/27/2015    History of Present Illness Pt adm with HCAP. PMH - CAD, DM, MI, CHF, GI bleed, HTN, afib    PT Comments    Mr. Hallum ambulated 350 ft using RW; however, pt requires max and constant verbal cues for pursed lip breathing, otherwise breathing in/out nose only.  Pt would benefit from follow up in the OPPT setting to reach Ind level of functioning for all aspects of mobility and to continue with energy conservation techniques.  Recommendations updated.   Follow Up Recommendations  Outpatient PT     Equipment Recommendations  None recommended by PT    Recommendations for Other Services       Precautions / Restrictions Precautions Precautions: Fall;Other (comment) Precaution Comments: monitor O2 Restrictions Weight Bearing Restrictions: No    Mobility  Bed Mobility Overal bed mobility: Modified Independent             General bed mobility comments: Incr time  Transfers Overall transfer level: Needs assistance Equipment used: Rolling walker (2 wheeled) Transfers: Sit to/from Stand Sit to Stand: Min guard         General transfer comment: Cues to keep RW with him when turning to sit   Ambulation/Gait Ambulation/Gait assistance: Min guard Ambulation Distance (Feet): 350 Feet Assistive device: Rolling walker (2 wheeled) Gait Pattern/deviations: Step-through pattern;Decreased stride length;Trunk flexed Gait velocity: decreased   General Gait Details: Cues for upright posture and foward gaze as pt demonstrates flexed posture.  SpO2 remains at or above 89% on 2L O2.  However, pt requires max and constant verbal cues for pursed lip breathing, otherwise breathing in/out nose only.   Stairs            Wheelchair Mobility    Modified Rankin (Stroke Patients Only)       Balance Overall balance assessment: Needs assistance Sitting-balance  support: No upper extremity supported;Feet supported Sitting balance-Leahy Scale: Good     Standing balance support: No upper extremity supported;During functional activity Standing balance-Leahy Scale: Fair Standing balance comment: Pt able to stand statically while adjusting gown                    Cognition Arousal/Alertness: Awake/alert Behavior During Therapy: WFL for tasks assessed/performed Overall Cognitive Status: Impaired/Different from baseline Area of Impairment: Problem solving             Problem Solving: Slow processing      Exercises Other Exercises Other Exercises: Encouraged pt to continue practicing pursed lip breathing technique    General Comments        Pertinent Vitals/Pain Pain Assessment: No/denies pain    Home Living                      Prior Function            PT Goals (current goals can now be found in the care plan section) Acute Rehab PT Goals Patient Stated Goal: return home PT Goal Formulation: With patient Time For Goal Achievement: 12/30/15 Potential to Achieve Goals: Good Progress towards PT goals: Progressing toward goals    Frequency  Min 3X/week    PT Plan Current plan remains appropriate    Co-evaluation             End of Session Equipment Utilized During Treatment: Gait belt;Oxygen Activity Tolerance: Patient tolerated treatment well;Patient limited by fatigue Patient left: in  bed;with bed alarm set;with call bell/phone within reach     Time: 1454-1511 PT Time Calculation (min) (ACUTE ONLY): 17 min  Charges:  $Gait Training: 8-22 mins                    G Codes:       Collie Siad PT, DPT  Pager: [phone] Phone: [phone] 12/27/2015, 5:12 PM

## 2015-12-27 NOTE — Progress Notes (Signed)
PULMONARY / CRITICAL CARE MEDICINE   Name: Curtis Frazier MRN: [id number] DOB: 09-Feb-1941    ADMISSION DATE:  12/22/2015 CONSULTATION DATE:  12/25/2015  REFERRING MD:  Dr. Grandville Silos, [facility]  CHIEF COMPLAINT:  Short of breath  BRIEF SUMMARY:   75 y/o male was in hospital 7/05 for cardiac ablation with hx of a flutter.  He developed cough, chest congestion and progressive dyspnea.  Noted to have hypoxia & CXR showed Rt sided PNA.  He was started on ABx for HCAP.  He reports subjective fevers.  He had chest discomfort, cough with clear sputum, but improved. O2 needs weaned to 2L.  He has hx of rheumatoid arthritis and has been on remicade most recently >> followed by Dr. Amil Amen with rheumatology.  SUBJECTIVE:  Reports feeling better.  O2 needs weaned to 2L  VITAL SIGNS: BP 129/69 mmHg  Pulse 92  Temp(Src) 99.7 F (37.6 C) (Oral)  Resp 18  Ht 5\' 8"  (1.727 m)  Wt 164 lb (74.39 kg)  BMI 24.94 kg/m2  SpO2 95%  INTAKE / OUTPUT: I/O last 3 completed shifts: In: 1790 [P.O.:1440; IV Piggyback:350] Out: 3000 [Urine:3000]  PHYSICAL EXAMINATION: General:  Alert, NAD lying in bed Neuro:  Normal strength, AAOx4, speech clear, MAE HEENT:  No sinus tenderness, no oral exudate, no LAN Cardiovascular:  Regular, 3/6 SEM heard best 5th ICS mid-clavicular line, radiates into axilla Lungs:  resps even non labored on [place], few bibasilar crackles, no wheeze Abdomen:  Soft, non tender Musculoskeletal:  No edema Skin:  No rashes  LABS:  BMET  Recent Labs Lab 12/25/15 0355 12/26/15 0358 12/27/15 0440  NA 134* 129* 129*  K 3.9 3.9 4.0  CL 100* 95* 96*  CO2 25 25 25   BUN 18 26* 32*  CREATININE 1.46* 1.65* 1.84*  GLUCOSE 124* 242* 185*    Electrolytes  Recent Labs Lab 12/25/15 0355 12/26/15 0358 12/27/15 0440  CALCIUM 9.4 9.1 9.2  MG 1.4* 1.7  --     CBC  Recent Labs Lab 12/25/15 0355 12/26/15 0358 12/27/15 0440  WBC 11.5* 12.2* 11.5*  HGB 12.4* 11.3* 11.6*  HCT 38.6* 34.3*  36.1*  PLT 315 317 339    Coag's  Recent Labs Lab 12/25/15 0355 12/26/15 0358 12/27/15 0440  INR 1.60* 1.89* 2.37*    Sepsis Markers  Recent Labs Lab 12/22/15 2309 12/26/15 1356  LATICACIDVEN 0.95  --   PROCALCITON  --  0.43   Glucose  Recent Labs Lab 12/25/15 2135 12/26/15 0806 12/26/15 1254 12/26/15 1705 12/26/15 1959 12/27/15 0750  GLUCAP 196* 235* 263* 329* 329* 221*    Imaging Ct Angio Chest Pe W Or Wo Contrast  12/25/2015  CLINICAL DATA:  Shortness of breath and hypoxia. EXAM: CT ANGIOGRAPHY CHEST WITH CONTRAST TECHNIQUE: Multidetector CT imaging of the chest was performed using the standard protocol during bolus administration of intravenous contrast. Multiplanar CT image reconstructions and MIPs were obtained to evaluate the vascular anatomy. CONTRAST:  100 cc Isovue-300 COMPARISON:  09/06/2015. FINDINGS: Mediastinum / Lymph Nodes: There is no axillary lymphadenopathy. Stable 15 mm left thyroid nodule. Mediastinal lymphadenopathy is progressed in the interval. 8 mm short axis prevascular lymph node. 14 mm short axis precarinal lymph node on today's study measured only 10 mm short axis previously. 18 mm short axis right hilar lymph node is associated with a 14 mm short axis infrahilar right lymph node. Heart is enlarged. Coronary artery calcification is noted. The esophagus has normal imaging features. No evidence for filling  defect in the opacified pulmonary arteries to suggest the presence of an acute pulmonary embolus. Lungs / Pleura: Interlobular septal thickening is associated with bilateral patchy ground-glass attenuation superimposed on emphysema. More focal airspace collapse/consolidation is identified in the lower lobes bilaterally. Upper Abdomen:  Unremarkable. MSK / Soft Tissues:  Incomplete visualization thoracolumbar fusion. Review of the MIP images confirms the above findings. IMPRESSION: 1. No CT evidence for acute pulmonary embolus. 2. Interlobular septal  thickening with bilateral ground-glass attenuation. Imaging features are compatible with pulmonary edema although superimposed infectious/inflammatory etiology is not excluded. Bibasilar collapse/ consolidation. 3. Interval progression of mediastinal and right hilar lymphadenopathy, nonspecific. Underlying neoplasm not excluded and PET-CT may prove helpful to further evaluate. Electronically Signed   By: Misty Stanley M.D.   On: 12/25/2015 13:28    STUDIES:  CT angio chest 7/15 >> No CT evidence for acute pulmonary embolus.  Interlobular septal thickening with bilateral ground-glass attenuation. Imaging features are compatible with pulmonary edema although superimposed infectious/inflammatory etiology is not excluded. Bibasilar collapse/ consolidation. Interval progression of mediastinal and right hilar lymphadenopathy, nonspecific. Underlying neoplasm not excluded and PET-CT may prove helpful to further evaluate.  CULTURES: 7/12 Blood >> 7/15 RSV >> NEG 7/14 sputum >> not lower specimen  ANTIBIOTICS: 7/12 Vancomycin >> 7/12 Cefepime >>  SIGNIFICANT EVENTS: 7/12  Admit 7/17  O2 weaned to 2L   DISCUSSION: 75 yo male with acute hypoxic respiratory failure from HCAP.  He has hx of RA on remicade and low dose prednisone as outpt, reported hx of COPD, diastolic CHF, AF on coumadin.  ASSESSMENT / PLAN:  Acute hypoxic respiratory failure.  Plan: - oxygen to keep SpO2 > 92% - lasix as below   HCAP - RVP negative   Plan: - continue Abx, D6/x - aggressive pulm hygiene  - intermittent CXR   Acute on chronic diastolic CHF.  Plan: - diuresis as renal function / BP permit - trend BNP   Reported hx of COPD - doesn't feel nebulizer therapy helps.  Plan: - PRN bronchodilators for wheezing - no role steroids at this time  - will need outpatient pulmonary follow up to assess COPD   Curtis Gens, NP-C [facility] Pgr: [phone] or if no answer  [phone] 12/27/2015, 10:05 AM

## 2015-12-27 NOTE — Progress Notes (Addendum)
Pt was satting 76% with a good waveform on 9 L of oxygen [place].  Probe was on ear lobe, RN changed it to forehead and oxygen was 100%.  Pt is now 99% on 3.5 L.  I think the issue with him frequently desatting is related to the probe and not so much his inability to oxygenate.

## 2015-12-27 NOTE — Telephone Encounter (Signed)
Cindy with [employer] notified as instructed by telephone and verbalized understanding.

## 2015-12-27 NOTE — Telephone Encounter (Signed)
Curtis Frazier called stating she called 7/10 and left message has not heard anything back  Concerning follow up on what she did She saw pt  7/8  Pt was in hospital for atrial flutter and skilled nursing for pulm and cardiac program She stated she needs ok for seeing pt that day  Pt is in hospital 7/14

## 2015-12-27 NOTE — Consult Note (Signed)
   [facility] CM Inpatient Consult   12/27/2015  VARD WAYNER [birth year] [id number]  Patient is currently active with [facility] for chronic disease management services. Per chart review Patient is a 13 year gentleman history of hyperlipidemia, diabetes, chronic kidney disease stage III, diastolic HF, paroxysmal atrial fibrillation on Coumadin/atrial flutter status post recent CTI ablation per cardiology 12/15/2015 who presents to the ED with a 2 day history of cough, shortness of breath, chest pain pleuritic in nature with subjective fever and noted per chest x-ray to have a right middle and right lower lobe pneumonia.  Patient has been engaged by a [employer].  Our community based plan of care has focused on disease management and community resource support.  Patient will receive a post discharge transition of care call and will be evaluated for monthly home visits for assessments and disease process education.  Made Inpatient Case Manager aware that [facility] following. Of note, [facility] Care Management services does not replace or interfere with any services that are needed or arranged by inpatient case management or social work.  For additional questions or referrals please contact:  Natividad Brood, RN BSN [facility] Liaison  [phone] business mobile phone Toll free office [phone]

## 2015-12-27 NOTE — Progress Notes (Signed)
Patient ID: Curtis Frazier, male   DOB: Feb 12, 1941, 75 y.o.   MRN: [id number]    Subjective:  Still with some dyspnea  Objective:  Filed Vitals:   12/27/15 0000 12/27/15 0200 12/27/15 0341 12/27/15 0752  BP: 111/57 118/64 100/56 129/69  Pulse: 80 85 83 92  Temp: 98.4 F (36.9 C)  98.6 F (37 C) 99.7 F (37.6 C)  TempSrc: Oral  Oral Oral  Resp: 25 19 23 18   Height:      Weight:      SpO2: 100% 98% 97% 95%    Intake/Output from previous day:  Intake/Output Summary (Last 24 hours) at 12/27/15 0856 Last data filed at 12/27/15 0344  Gross per 24 hour  Intake    770 ml  Output   2400 ml  Net  -1630 ml    Physical Exam: Affect appropriate Healthy:  appears stated age HEENT: normal Neck supple with no adenopathy JVP normal no bruits no thyromegaly Lungs rhonchi / cough exp wheezing and good diaphragmatic motion Heart:  S1/S2 SEM  murmur, no rub, gallop or click PMI normal Abdomen: benighn, BS positve, no tenderness, no AAA no bruit.  No HSM or HJR Distal pulses intact with no bruits No edema Neuro non-focal Skin warm and dry No muscular weakness   Lab Results: Basic Metabolic Panel:  Recent Labs  12/25/15 0355 12/26/15 0358 12/27/15 0440  NA 134* 129* 129*  K 3.9 3.9 4.0  CL 100* 95* 96*  CO2 25 25 25   GLUCOSE 124* 242* 185*  BUN 18 26* 32*  CREATININE 1.46* 1.65* 1.84*  CALCIUM 9.4 9.1 9.2  MG 1.4* 1.7  --    CBC:  Recent Labs  12/26/15 0358 12/27/15 0440  WBC 12.2* 11.5*  NEUTROABS 9.3*  --   HGB 11.3* 11.6*  HCT 34.3* 36.1*  MCV 90.0 89.8  PLT 317 339    Imaging: Ct Angio Chest Pe W Or Wo Contrast  12/25/2015  CLINICAL DATA:  Shortness of breath and hypoxia. EXAM: CT ANGIOGRAPHY CHEST WITH CONTRAST TECHNIQUE: Multidetector CT imaging of the chest was performed using the standard protocol during bolus administration of intravenous contrast. Multiplanar CT image reconstructions and MIPs were obtained to evaluate the vascular anatomy. CONTRAST:   100 cc Isovue-300 COMPARISON:  09/06/2015. FINDINGS: Mediastinum / Lymph Nodes: There is no axillary lymphadenopathy. Stable 15 mm left thyroid nodule. Mediastinal lymphadenopathy is progressed in the interval. 8 mm short axis prevascular lymph node. 14 mm short axis precarinal lymph node on today's study measured only 10 mm short axis previously. 18 mm short axis right hilar lymph node is associated with a 14 mm short axis infrahilar right lymph node. Heart is enlarged. Coronary artery calcification is noted. The esophagus has normal imaging features. No evidence for filling defect in the opacified pulmonary arteries to suggest the presence of an acute pulmonary embolus. Lungs / Pleura: Interlobular septal thickening is associated with bilateral patchy ground-glass attenuation superimposed on emphysema. More focal airspace collapse/consolidation is identified in the lower lobes bilaterally. Upper Abdomen:  Unremarkable. MSK / Soft Tissues:  Incomplete visualization thoracolumbar fusion. Review of the MIP images confirms the above findings. IMPRESSION: 1. No CT evidence for acute pulmonary embolus. 2. Interlobular septal thickening with bilateral ground-glass attenuation. Imaging features are compatible with pulmonary edema although superimposed infectious/inflammatory etiology is not excluded. Bibasilar collapse/ consolidation. 3. Interval progression of mediastinal and right hilar lymphadenopathy, nonspecific. Underlying neoplasm not excluded and PET-CT may prove helpful to further evaluate. Electronically Signed  By: Misty Stanley M.D.   On: 12/25/2015 13:28    Cardiac Studies:  ECG:  Orders placed or performed during the hospital encounter of 12/22/15  . EKG 12-Lead  . EKG 12-Lead  . EKG 12-Lead  . EKG 12-Lead  . EKG     Telemetry:  SR/ST occasional PVC;s   Echo: Moderate AS mean gradient 21 mmHg  July 2017  Medications:   . amiodarone  200 mg Oral BID  . calcium-vitamin D  1 tablet Oral Q  breakfast  . ceFEPime (MAXIPIME) IV  1 g Intravenous Q24H  . dextromethorphan-guaiFENesin  2 tablet Oral BID  . diltiazem  240 mg Oral Daily  . docusate sodium  100 mg Oral BID  . ferrous sulfate  325 mg Oral TID WC  . insulin aspart  0-15 Units Subcutaneous TID WC  . insulin aspart  0-5 Units Subcutaneous QHS  . insulin aspart  3 Units Subcutaneous TID WC  . insulin detemir  15 Units Subcutaneous Daily  . pantoprazole  40 mg Oral Daily  . pravastatin  20 mg Oral QPM  . predniSONE  5 mg Oral Q breakfast  . thiamine  100 mg Oral Daily  . warfarin  2.5 mg Oral ONCE-1800  . Warfarin - Pharmacist Dosing Inpatient   Does not apply q1800       Assessment/Plan:  75 yo with complicated past history including RA and PMR, CAD, chronic diastolic CHF, atrial fibrillation (paroxysmal) and atrial flutter (s/p ablation) was admitted with presumed PNA.  1. Acute hypoxemic respiratory failure:  procalcitonin low on antibiotics and steroids.  D/C amiodarone concern for toxicity 2. Chronic diastolic CHF: Please see discussion above. Will give 1 dose IV Lasix today but would not push hard on diuresis as I do not think that he is markedly volume up and creatinine is elevated.  3. AKI: Mild rise in creatinine. hold lasix for now not volume overloaded on exam and BNP low   4. Atrial flutter: Remains in NSR on warfarin post ablation d/c amiodarone    5. CAD: No evidence for ischemia.  6. AS: moderate by echo loud murmur f/u echo in a year   Jenkins Rouge 12/27/2015, 8:56 AM

## 2015-12-28 ENCOUNTER — Inpatient Hospital Stay (HOSPITAL_COMMUNITY): Payer: Medicare Other

## 2015-12-28 DIAGNOSIS — I2510 Atherosclerotic heart disease of native coronary artery without angina pectoris: Secondary | ICD-10-CM

## 2015-12-28 DIAGNOSIS — I5032 Chronic diastolic (congestive) heart failure: Secondary | ICD-10-CM

## 2015-12-28 LAB — CBC
HCT: 35.5 % — ABNORMAL LOW (ref 39.0–52.0)
HEMOGLOBIN: 11.7 g/dL — AB (ref 13.0–17.0)
MCH: 29.5 pg (ref 26.0–34.0)
MCHC: 33 g/dL (ref 30.0–36.0)
MCV: 89.4 fL (ref 78.0–100.0)
PLATELETS: 359 10*3/uL (ref 150–400)
RBC: 3.97 MIL/uL — AB (ref 4.22–5.81)
RDW: 17 % — ABNORMAL HIGH (ref 11.5–15.5)
WBC: 13.3 10*3/uL — AB (ref 4.0–10.5)

## 2015-12-28 LAB — GLUCOSE, CAPILLARY
GLUCOSE-CAPILLARY: 195 mg/dL — AB (ref 65–99)
GLUCOSE-CAPILLARY: 232 mg/dL — AB (ref 65–99)
GLUCOSE-CAPILLARY: 241 mg/dL — AB (ref 65–99)
Glucose-Capillary: 140 mg/dL — ABNORMAL HIGH (ref 65–99)

## 2015-12-28 LAB — PROTIME-INR
INR: 2.29 — AB (ref 0.00–1.49)
PROTHROMBIN TIME: 25 s — AB (ref 11.6–15.2)

## 2015-12-28 LAB — CULTURE, BLOOD (ROUTINE X 2)
CULTURE: NO GROWTH
Culture: NO GROWTH

## 2015-12-28 LAB — PROCALCITONIN: PROCALCITONIN: 0.46 ng/mL

## 2015-12-28 MED ORDER — ONDANSETRON HCL 4 MG/2ML IJ SOLN: 4.0000 mg | Freq: Four times a day (QID) | INTRAMUSCULAR | Status: DC | PRN

## 2015-12-28 MED ORDER — ONDANSETRON HCL 4 MG/2ML IJ SOLN
4.0000 mg | Freq: Three times a day (TID) | INTRAMUSCULAR | Status: DC | PRN
  Filled 2015-12-28: qty 2

## 2015-12-28 MED ORDER — WARFARIN SODIUM 5 MG PO TABS
5.0000 mg | ORAL_TABLET | Freq: Once | ORAL | Status: AC
  Administered 2015-12-28: 5 mg via ORAL
  Filled 2015-12-28: qty 1

## 2015-12-28 NOTE — Progress Notes (Signed)
Patient ID: Curtis Frazier, male   DOB: 04-12-1941, 75 y.o.   MRN: [id number]    Subjective:  Nausea Had BM yesterday   Objective:  Filed Vitals:   12/27/15 2014 12/27/15 2332 12/28/15 0407 12/28/15 0737  BP: 117/50 120/53 124/55 116/87  Pulse:  76 92 93  Temp: 98.4 F (36.9 C) 98.7 F (37.1 C) 100.3 F (37.9 C) 99.2 F (37.3 C)  TempSrc: Oral Oral Oral Oral  Resp:    23  Height:      Weight:      SpO2:    94%    Intake/Output from previous day:  Intake/Output Summary (Last 24 hours) at 12/28/15 [id number] Last data filed at 12/28/15 0409  Gross per 24 hour  Intake   1440 ml  Output   1800 ml  Net   -360 ml    Physical Exam: Affect appropriate Healthy:  appears stated age 89: normal Neck supple with no adenopathy JVP normal no bruits no thyromegaly Lungs rhonchi / cough exp wheezing and good diaphragmatic motion Heart:  S1/S2 SEM  murmur, no rub, gallop or click PMI normal Abdomen: benighn, BS positve, no tenderness, no AAA no bruit.  No HSM or HJR Distal pulses intact with no bruits No edema Neuro non-focal Skin warm and dry No muscular weakness   Lab Results: Basic Metabolic Panel:  Recent Labs  12/26/15 0358 12/27/15 0440  NA 129* 129*  K 3.9 4.0  CL 95* 96*  CO2 25 25  GLUCOSE 242* 185*  BUN 26* 32*  CREATININE 1.65* 1.84*  CALCIUM 9.1 9.2  MG 1.7  --    CBC:  Recent Labs  12/26/15 0358 12/27/15 0440  WBC 12.2* 11.5*  NEUTROABS 9.3*  --   HGB 11.3* 11.6*  HCT 34.3* 36.1*  MCV 90.0 89.8  PLT 317 339    Imaging: Dg Chest 2 View  12/28/2015  CLINICAL DATA:  Shortness of breath.  Pneumonia. EXAM: CHEST  2 VIEW COMPARISON:  Chest CT 12/25/2015. Chest x-ray 12/24/2015, 10/11/2015. FINDINGS: Persistent bilateral hilar fullness noted consistent previously identified adenopathy. Cardiomegaly. Diffuse pulmonary interstitial prominence again noted. Active interstitial lung disease and/or interstitial edema could present this fashion. No pleural  effusion or pneumothorax. Prior thoracolumbar spine fusion. Thoracolumbar scoliosis. IMPRESSION: 1. Persistent bilateral hilar fullness noted consistent with previously identified adenopathy. Reference is made to chest CT report 12/25/2015. 2.  Stable cardiomegaly. 3. Persistent bilateral pulmonary interstitial prominence consistent with active interstitial lung disease and/or interstitial edema. Electronically Signed   By: Marcello Moores  Register   On: 12/28/2015 07:28    Cardiac Studies:  ECG: SR rate 98 LAFB PVC    Telemetry:  SR/ST occasional PVC;s 12/28/2015   Echo: Moderate AS mean gradient 21 mmHg  July 2017  Medications:   . calcium-vitamin D  1 tablet Oral Q breakfast  . dextromethorphan-guaiFENesin  2 tablet Oral BID  . diltiazem  240 mg Oral Daily  . docusate sodium  100 mg Oral BID  . ferrous sulfate  325 mg Oral TID WC  . insulin aspart  0-15 Units Subcutaneous TID WC  . insulin aspart  0-5 Units Subcutaneous QHS  . insulin aspart  3 Units Subcutaneous TID WC  . insulin detemir  15 Units Subcutaneous Daily  . levofloxacin  750 mg Oral Q48H  . pantoprazole  40 mg Oral Daily  . pravastatin  20 mg Oral QPM  . predniSONE  5 mg Oral Q breakfast  . thiamine  100 mg Oral  Daily  . Warfarin - Pharmacist Dosing Inpatient   Does not apply q1800       Assessment/Plan:  75 yo with complicated past history including RA and PMR, CAD, chronic diastolic CHF, atrial fibrillation (paroxysmal) and atrial flutter (s/p ablation) was admitted with presumed PNA.  1. Acute hypoxemic respiratory failure:  procalcitonin low on antibiotics and steroids.  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 Chronic diastolic CHF:  euvolemic Cr elevated no diuretic   3. AKI: Cr 1.84 follow   4. Atrial flutter: Remains in NSR on warfarin post ablation d/c amiodarone    5. CAD: No evidence for ischemia.  6. AS: moderate by echo loud murmur f/u echo in a  year  7. Nausea: abdomen benign have written for Zofran  Will sign off   Jenkins Rouge 12/28/2015, 8:07 AM

## 2015-12-28 NOTE — Progress Notes (Signed)
PROGRESS NOTE    Curtis Frazier  [id number] DOB: 07-Dec-1940 DOA: 12/22/2015 PCP: Loura Pardon, MD    Brief Narrative:  Patient is a 35 year gentleman history of hyperlipidemia, diabetes, chronic kidney disease stage III, diastolic CHF, paroxysmal atrial fibrillation on Coumadin/atrial flutter status post recent CTI ablation per cardiology 12/15/2015 who presents to the ED with a 2 day history of cough, shortness of breath, chest pain pleuritic in nature with subjective fever and noted per chest x-ray to have a right middle and right lower lobe pneumonia. Patient's blood pressure noted to be borderline. Patient started empirically on IV vancomycin IV cefepime for healthcare associated pneumonia. During the hospitalization patient noted to be hypoxic requiring up to 10 L nasal, of oxygenation. CT angiogram chest done was negative for PE. Concern for acute on chronic diastolic heart failure. Blood pressure borderline. Patient s/p IV Lasix.   Assessment & Plan:   Principal Problem:   Acute respiratory failure with hypoxia (HCC) Active Problems:   Coronary atherosclerosis   Rheumatoid arthritis (HCC)   PMR (polymyalgia rheumatica) (HCC)   Back pain without radiation   Chronic diastolic congestive heart failure (HCC)   Paroxysmal atrial fibrillation (HCC)   Diabetes mellitus with complication (HCC)   Kidney disease, chronic, stage III (GFR 30-59 ml/min)   Atrial flutter (HCC)   Acute on chronic diastolic HF (heart failure) (HCC)   Atrial fibrillation with RVR (HCC)   Hyperlipidemia   Hypoxia   HCAP (healthcare-associated pneumonia)   Hyponatremia   GERD (gastroesophageal reflux disease)   Healthcare-associated pneumonia  #1 acute respiratory failure with hypoxia Patient noted to be hypoxic overnight for the past 4 days. Likely multifactorial secondary to healthcare associated pneumonia and acute on chronic diastolic heart failure and ?? Amiodarone toxicity versus secondary to probe  placement. Patient currently 2L nasal cannula are 97-100%. Patient does not look volume overloaded. Patient does not look significantly short of breath and looks clinically better than noted on his sats. Patient's INR was subtherapeutic and currently now therapeutic. Chest x-ray done showed findings concerning for developing right upper lobe pneumonia on a background of chronic interstitial lung disease.  CT angiogram chest negative for PE, interlobular septal thickening with bilateral groundglass attenuation compatible with pulmonary edema although superimposed infectious/inflammatory etiologies not excluded. Bibasilar collapse/consolidation. Interval progression of mediastinal and right hilar lymphadenopathy nonspecific.Continue empiric IV antibiotics. Discontinue IV vancomycin. BNP trending down currently at 104.6 from 186.4. Patient given multiple doses of Lasix over the past 2-3 days with good urine output. Patient is -2.89 L during this hospitalization. Patient now on the requiring 2 L nasal cannula. No further diuretics will be given per cardiology recommendations. Amiodarone has been discontinued per cardiology due to concerns for possible amiodarone toxicity. PCCM and cardiology following and appreciate input and recommendations. Titrate to keep O2 between 88-94%. Assess patient's oxygen needs. Patient will need to follow-up with pulmonary in outpatient setting. PCCM ff and appreciate input and rxcs.  #2 healthcare associated pneumonia Patient presented with subjective fever, shortness of breath, cough with chest x-ray findings consistent with a healthcare associated pneumonia in the right middle and right lower lobes. Titrate O2. Sputum Gram stain and culture pending. Urine Legionella antigen pending. Urine pneumococcus antigen negative. Patient noted to be hypoxic overnight and currently on nasal cannula at 8-9 L. Patient denies any significant shortness of breath. CT angiogram chest negative for PE,  interlobular septal thickening with bilateral groundglass attenuation compatible with pulmonary edema although superimposed infectious/inflammatory etiologies not excluded. Bibasilar  collapse/consolidation. Interval progression of mediastinal and right hilar lymphadenopathy nonspecific. Continue Mucinex DM. Discontinued IV vancomycin and IV cefepime and transitioned to oral Levaquin. Scheduled Pulmicort, nebulizer treatments have been discontinued per pulmonary.  Follow.   #3 diabetes mellitus Hemoglobin A1c was 9.2 on 10/25/2015. Patient on chronic steroids. CBG has ranged from 140-198. Continue Levemir 15 mg daily. Continue sliding scale insulin, meal coverage insulin NovoLog 3 units daily.  #4 history of rheumatoid arthritis/PMR Stable. Patient was given a one-time dose of Solu-Cortef 50 minute grams IV 1. Continue prednisone 5 mg daily.  #5 acute on chronic diastolic CHF/coronary artery disease 2-D echo from 09/06/2015 with a EF of 55-60% with grade 1 diastolic dysfunction. Patient not on diuretics at home. Patient with no JVD. No lower extremity edema. Patient hypoxia seems to be improving patient has been weaned down to 2 L nasal cannula. Saline lock IV fluids. CT chest negative for PE, interlobular septal thickening with bilateral groundglass attenuation compatible with pulmonary edema although superimposed infectious/inflammatory etiologies not excluded. Bibasilar collapse/consolidation. Interval progression of mediastinal and right hilar lymphadenopathy nonspecific. Patient was given a doses of Lasix 2-3 days. BNP trending down at 104.6 from 186.4. Hypoxia improved. Patient seems to compensated/euvolemic. Will hold on any further diuresis per cardiology recommendations. Patient had 1.8 L urine output yesterday and is -2.89 L during this hospitalization. Amiodarone has been discontinued per cardiology. Cardiology following and appreciate input and recommendations.   #6 hyperlipidemia Continue  statin.  #7 history of atrial flutter status post CTI ablation 12/15/2015/paroxysmal atrial fibrillation CHA2DS2VASC SCORE 5 Patient currently normal sinus rhythm with occasional PVCs. Patient asymptomatic. Continue diltiazem, Coumadin. INR is 2.29. Coumadin per pharmacy. Discontinued Lovenox as INR is therapeutic. Amiodarone has been discontinued per cardiology due to concerns for possible pulmonary toxicity. Patient was bridged with Lovenox secondary to recent ablation. This was discussed with Dr. Rayann Heman EP. Outpatient follow-up with cardiology.  #8 gastroesophageal reflux disease PPI.  #9 pleuritic chest pain Secondary to problem #1. Improving.  #10 COPD Stable. No wheezing noted. Scheduled Nebs discontinued per pulmonary. Continue albuterol nebulizers as needed.   #11 chronic kidney disease stage III Stable. Labs pending.  #12 probable chronic hyponatremia TSH within normal limits. Serum osmolalities 288. Urine osmolality is 461. Urine sodium of 43. Cortisol level at 53.9. Labs pending. Follow.  #13 nausea Patient placed on Zofran when necessary. Follow.   DVT prophylaxis: Lovenox, until Coumadin is therapeutic. Code Status: Full Family Communication: Updated patient. No family at bedside. Disposition Plan: Transfer to telemetry. If Patient with no nausea tomorrow and no complaints of shortness of breath hopefully discharge home. Prior to discharge will need to know home O2 needs.   Consultants:   PCCM: Dr Halford Chessman 12/25/2015  Cardiology: Dr. Aundra Dubin 12/26/2015  Procedures:   Chest x-ray 12/22/2015, 12/24/2015  CT angio chest 12/25/2015  Antimicrobials:   IV vancomycin 12/22/2015>>>>>12/26/2015  IV cefepime 12/22/2015>>>>> 12/27/2015  Oral Levaquin 12/27/2015   Subjective: Patient laying in bed easily arousable. Patient with complaints of nausea early on this morning. Patient denies any chest pain. No shortness of breath.  Objective: Filed Vitals:   12/27/15 2014  12/27/15 2332 12/28/15 0407 12/28/15 0737  BP: 117/50 120/53 124/55 116/87  Pulse:  76 92 93  Temp: 98.4 F (36.9 C) 98.7 F (37.1 C) 100.3 F (37.9 C) 99.2 F (37.3 C)  TempSrc: Oral Oral Oral Oral  Resp:    23  Height:      Weight:      SpO2:  94%    Intake/Output Summary (Last 24 hours) at 12/28/15 1131 Last data filed at 12/28/15 0900  Gross per 24 hour  Intake   1440 ml  Output   1800 ml  Net   -360 ml   Filed Weights   12/22/15 1729  Weight: 74.39 kg (164 lb)    Examination:  General exam: Appears calm and comfortable.  Respiratory system: Some bibasilar coarse breath sounds. Cardiovascular system: S1 & S2 heard, RRR. Systolic ejection murmur. No JVD. No rubs, gallops or clicks. No pedal edema. Gastrointestinal system: Abdomen is nondistended, soft and nontender. No organomegaly or masses felt. Normal bowel sounds heard. Central nervous system: Alert and oriented. No focal neurological deficits. Extremities: Symmetric 5 x 5 power. Skin: No rashes, lesions or ulcers.Right groin area with a bruise however no thrill noted, nontender to palpation, no warmth. Psychiatry: Judgement and insight appear normal. Mood & affect appropriate.     Data Reviewed: I have personally reviewed following labs and imaging studies  CBC:  Recent Labs Lab 12/22/15 1747  12/24/15 0308 12/25/15 0355 12/26/15 0358 12/27/15 0440 12/28/15 0738  WBC 10.8*  < > 12.7* 11.5* 12.2* 11.5* 13.3*  NEUTROABS 8.1*  --   --   --  9.3*  --   --   HGB 12.4*  < > 11.6* 12.4* 11.3* 11.6* 11.7*  HCT 38.9*  < > 35.4* 38.6* 34.3* 36.1* 35.5*  MCV 91.3  < > 90.3 91.0 90.0 89.8 89.4  PLT 277  < > 285 315 317 339 359  < > = values in this interval not displayed. Basic Metabolic Panel:  Recent Labs Lab 12/23/15 0955 12/24/15 0308 12/25/15 0355 12/26/15 0358 12/27/15 0440  NA 131* 133* 134* 129* 129*  K 4.4 4.2 3.9 3.9 4.0  CL 101 101 100* 95* 96*  CO2 21* 23 25 25 25   GLUCOSE 272* 112*  124* 242* 185*  BUN 22* 18 18 26* 32*  CREATININE 1.44* 1.19 1.46* 1.65* 1.84*  CALCIUM 8.9 8.9 9.4 9.1 9.2  MG  --   --  1.4* 1.7  --    GFR: Estimated Creatinine Clearance: 33 mL/min (by C-G formula based on Cr of 1.84). Liver Function Tests: No results for input(s): AST, ALT, ALKPHOS, BILITOT, PROT, ALBUMIN in the last 168 hours. No results for input(s): LIPASE, AMYLASE in the last 168 hours. No results for input(s): AMMONIA in the last 168 hours. Coagulation Profile:  Recent Labs Lab 12/24/15 0308 12/25/15 0355 12/26/15 0358 12/27/15 0440 12/28/15 0849  INR 1.91* 1.60* 1.89* 2.37* 2.29*   Cardiac Enzymes: No results for input(s): CKTOTAL, CKMB, CKMBINDEX, TROPONINI in the last 168 hours. BNP (last 3 results) No results for input(s): PROBNP in the last 8760 hours. HbA1C: No results for input(s): HGBA1C in the last 72 hours. CBG:  Recent Labs Lab 12/27/15 0750 12/27/15 1205 12/27/15 1616 12/27/15 2152 12/28/15 0733  GLUCAP 221* 346* 198* 167* 140*   Lipid Profile: No results for input(s): CHOL, HDL, LDLCALC, TRIG, CHOLHDL, LDLDIRECT in the last 72 hours. Thyroid Function Tests: No results for input(s): TSH, T4TOTAL, FREET4, T3FREE, THYROIDAB in the last 72 hours. Anemia Panel: No results for input(s): VITAMINB12, FOLATE, FERRITIN, TIBC, IRON, RETICCTPCT in the last 72 hours. Sepsis Labs:  Recent Labs Lab 12/22/15 2309 12/26/15 1356 12/28/15 0738  PROCALCITON  --  0.43 0.46  LATICACIDVEN 0.95  --   --     Recent Results (from the past 240 hour(s))  Blood culture (routine x  2)     Status: None (Preliminary result)   Collection Time: 12/22/15 10:55 PM  Result Value Ref Range Status   Specimen Description BLOOD RIGHT ANTECUBITAL  Final   Special Requests BOTTLES DRAWN AEROBIC AND ANAEROBIC 5CC  Final   Culture NO GROWTH 4 DAYS  Final   Report Status PENDING  Incomplete  Blood culture (routine x 2)     Status: None (Preliminary result)   Collection Time:  12/22/15 11:00 PM  Result Value Ref Range Status   Specimen Description BLOOD RIGHT HAND  Final   Special Requests IN PEDIATRIC BOTTLE 2CC  Final   Culture NO GROWTH 4 DAYS  Final   Report Status PENDING  Incomplete  MRSA PCR Screening     Status: None   Collection Time: 12/23/15  7:25 AM  Result Value Ref Range Status   MRSA by PCR NEGATIVE NEGATIVE Final    Comment:        The GeneXpert MRSA Assay (FDA approved for NASAL specimens only), is one component of a comprehensive MRSA colonization surveillance program. It is not intended to diagnose MRSA infection nor to guide or monitor treatment for MRSA infections.   Culture, sputum-assessment     Status: None   Collection Time: 12/23/15  5:15 PM  Result Value Ref Range Status   Specimen Description SPUTUM  Final   Special Requests NONE  Final   Sputum evaluation   Final    MICROSCOPIC FINDINGS SUGGEST THAT THIS SPECIMEN IS NOT REPRESENTATIVE OF LOWER RESPIRATORY SECRETIONS. PLEASE RECOLLECT. Deloris Ping RN [id number] 12/23/15 A BROWNING    Report Status 12/23/2015 FINAL  Final  Gram stain     Status: None   Collection Time: 12/24/15 11:25 PM  Result Value Ref Range Status   Specimen Description EXPECTORATED SPUTUM  Final   Special Requests NONE  Final   Gram Stain   Final    MODERATE WBC PRESENT,BOTH PMN AND MONONUCLEAR RARE SQUAMOUS EPITHELIAL CELLS PRESENT FEW GRAM POSITIVE COCCI FEW GRAM VARIABLE ROD    Report Status 12/25/2015 FINAL  Final  Respiratory Panel by PCR     Status: None   Collection Time: 12/25/15  9:45 AM  Result Value Ref Range Status   Adenovirus NOT DETECTED NOT DETECTED Final   Coronavirus 229E NOT DETECTED NOT DETECTED Final   Coronavirus HKU1 NOT DETECTED NOT DETECTED Final   Coronavirus NL63 NOT DETECTED NOT DETECTED Final   Coronavirus OC43 NOT DETECTED NOT DETECTED Final   Metapneumovirus NOT DETECTED NOT DETECTED Final   Rhinovirus / Enterovirus NOT DETECTED NOT DETECTED Final    Influenza A NOT DETECTED NOT DETECTED Final   Influenza A H1 NOT DETECTED NOT DETECTED Final   Influenza A H1 2009 NOT DETECTED NOT DETECTED Final   Influenza A H3 NOT DETECTED NOT DETECTED Final   Influenza B NOT DETECTED NOT DETECTED Final   Parainfluenza Virus 1 NOT DETECTED NOT DETECTED Final   Parainfluenza Virus 2 NOT DETECTED NOT DETECTED Final   Parainfluenza Virus 3 NOT DETECTED NOT DETECTED Final   Parainfluenza Virus 4 NOT DETECTED NOT DETECTED Final   Respiratory Syncytial Virus NOT DETECTED NOT DETECTED Final   Bordetella pertussis NOT DETECTED NOT DETECTED Final   Chlamydophila pneumoniae NOT DETECTED NOT DETECTED Final   Mycoplasma pneumoniae NOT DETECTED NOT DETECTED Final         Radiology Studies: Dg Chest 2 View  12/28/2015  CLINICAL DATA:  Shortness of breath.  Pneumonia. EXAM: CHEST  2  VIEW COMPARISON:  Chest CT 12/25/2015. Chest x-ray 12/24/2015, 10/11/2015. FINDINGS: Persistent bilateral hilar fullness noted consistent previously identified adenopathy. Cardiomegaly. Diffuse pulmonary interstitial prominence again noted. Active interstitial lung disease and/or interstitial edema could present this fashion. No pleural effusion or pneumothorax. Prior thoracolumbar spine fusion. Thoracolumbar scoliosis. IMPRESSION: 1. Persistent bilateral hilar fullness noted consistent with previously identified adenopathy. Reference is made to chest CT report 12/25/2015. 2.  Stable cardiomegaly. 3. Persistent bilateral pulmonary interstitial prominence consistent with active interstitial lung disease and/or interstitial edema. Electronically Signed   By: Marcello Moores  Register   On: 12/28/2015 07:28        Scheduled Meds: . calcium-vitamin D  1 tablet Oral Q breakfast  . dextromethorphan-guaiFENesin  2 tablet Oral BID  . diltiazem  240 mg Oral Daily  . docusate sodium  100 mg Oral BID  . ferrous sulfate  325 mg Oral TID WC  . insulin aspart  0-15 Units Subcutaneous TID WC  . insulin  aspart  0-5 Units Subcutaneous QHS  . insulin aspart  3 Units Subcutaneous TID WC  . insulin detemir  15 Units Subcutaneous Daily  . levofloxacin  750 mg Oral Q48H  . pantoprazole  40 mg Oral Daily  . pravastatin  20 mg Oral QPM  . predniSONE  5 mg Oral Q breakfast  . thiamine  100 mg Oral Daily  . warfarin  5 mg Oral ONCE-1800  . Warfarin - Pharmacist Dosing Inpatient   Does not apply q1800   Continuous Infusions:     LOS: 5 days    Time spent: 33 mins    THOMPSON,DANIEL, MD [facility] Pager [phone]  If 7PM-7AM, please contact night-coverage [email or web address] Password [facility] 12/28/2015, 11:31 AM

## 2015-12-28 NOTE — Progress Notes (Signed)
Pt from [place]. Oriented x4. 02 sat on the lower 80"s. Pt with no complaints of SOB . resp therapist called  For treatment and put him on 02  10 L per min. Sat i,proved to 91 %. Will continue to observe pt closely

## 2015-12-28 NOTE — Care Management Note (Signed)
Case Management Note  Patient Details  Name: Curtis Frazier MRN: [id number] Date of Birth: Apr 07, 1941  Subjective/Objective:     Lives with spouse, is active with [facility] for RN, PT services.                       Expected Discharge Plan:  [place]  Discharge planning Services  CM Consult  Post Acute Care Choice:  Resumption of Svcs/PTA Provider  HH Arranged:  PT, RN [facility] Agency:  [facility] (now [facility])  Status of Service:  In process, will continue to follow  Girard Cooter, RN 12/28/2015, 3:11 PM

## 2015-12-28 NOTE — Progress Notes (Signed)
SATURATION QUALIFICATIONS: (This note is used to comply with regulatory documentation for home oxygen)  Patient Saturations on Room Air at Rest = unable to tolerate   Patient Saturations on Room Air while Ambulating = unable to tolerate  Patient Saturations on 2 Liters of oxygen while Ambulating = 82%  Please briefly explain why patient needs home oxygen: oxygen had to be increased to 4L for ambulation.

## 2015-12-28 NOTE — Progress Notes (Signed)
Pt c/o slight shortness of breath, lung sounds clear at top and diminished at bases. RR is increased from his baseline to 28. O2 saturations WNL with good waveform. RT called for PRN breathing treatment.

## 2015-12-28 NOTE — Progress Notes (Signed)
Occupational Therapy Treatment Patient Details Name: Curtis Frazier MRN: [id number] DOB: [birth year] Today's Date: 12/28/2015    History of present illness Pt adm with HCAP. PMH - CAD, DM, MI, CHF, GI bleed, HTN, afib   OT comments  Pt requires min guard assist for ADLs.  Sats decreased to 82% on 2L while standing at sink to perform grooming.  He required sitting rest break and 02 increased to 4L to rebound.  DOE 4/4.   Pt then ambulated 250' with sats >90% on 2L.  He requires constant cues for pursed lip breathing.   Follow Up Recommendations  No OT follow up;Supervision - Intermittent    Equipment Recommendations  None recommended by OT    Recommendations for Other Services      Precautions / Restrictions Precautions Precautions: Fall;Other (comment) Precaution Comments: monitor O2       Mobility Bed Mobility                  Transfers Overall transfer level: Needs assistance   Transfers: Sit to/from Stand;Stand Pivot Transfers Sit to Stand: Min guard Stand pivot transfers: Min guard       General transfer comment: cues for safety     Balance Overall balance assessment: Needs assistance   Sitting balance-Leahy Scale: Good     Standing balance support: During functional activity Standing balance-Leahy Scale: Fair                     ADL Overall ADL's : Needs assistance/impaired     Grooming: Wash/dry hands;Wash/dry face;Oral care;Standing;Min guard Grooming Details (indicate cue type and reason): sats decreased to 82% while standing at sink brushing teeth on 2L supplemental 02.  02 incresaed to 4L, and pt returned to sitting due to DOE 4/4.  Pt instructed in pursed lip breathing and recovered to 100% after ~2 mins                  Toilet Transfer: Min guard;RW           Functional mobility during ADLs: Min guard;Rolling walker General ADL Comments: Pt requires max/constant cues for pursed lip breathing.  Despite detailed explanation  and repeated cues, he continues to mouth breathe       Vision                     Perception     Praxis      Cognition   Behavior During Therapy: [facility] for tasks assessed/performed Overall Cognitive Status: Impaired/Different from baseline Area of Impairment: Problem solving              Problem Solving: Slow processing;Requires verbal cues      Extremity/Trunk Assessment               Exercises     Shoulder Instructions       General Comments      Pertinent Vitals/ Pain       Pain Assessment: No/denies pain  Home Living                                          Prior Functioning/Environment              Frequency Min 2X/week     Progress Toward Goals  OT Goals(current goals can now be found in the care plan section)  Progress towards  OT goals: Progressing toward goals  ADL Goals Pt Will Perform Grooming: with modified independence;standing Pt Will Perform Upper Body Bathing: with modified independence;sitting;standing Pt Will Perform Lower Body Bathing: with modified independence;sit to/from stand Pt Will Perform Upper Body Dressing: with modified independence;sitting Pt Will Perform Lower Body Dressing: with modified independence;sit to/from stand Pt Will Transfer to Toilet: with modified independence;ambulating;regular height toilet;bedside commode;grab bars Pt Will Perform Toileting - Clothing Manipulation and hygiene: with modified independence;sit to/from stand Pt Will Perform Tub/Shower Transfer: Shower transfer;with modified independence;shower seat;rolling walker Pt/caregiver will Perform Home Exercise Program: Increased strength;Right Upper extremity;Left upper extremity;With theraband;Independently;With written HEP provided  Plan Discharge plan remains appropriate    Co-evaluation                 End of Session Equipment Utilized During Treatment: Rolling walker;Oxygen   Activity Tolerance Patient  limited by fatigue   Patient Left in bed;with call bell/phone within reach   Nurse Communication Mobility status        Time: [id number] OT Time Calculation (min): 25 min  Charges: OT General Charges $OT Visit: 1 Procedure OT Treatments $Self Care/Home Management : 8-22 mins $Therapeutic Activity: 8-22 mins  Yehonatan Grandison M 12/28/2015, 3:31 PM

## 2015-12-28 NOTE — Progress Notes (Signed)
ANTICOAGULATION CONSULT NOTE - Follow Up Consult  Pharmacy Consult:  Coumadin Indication: atrial fibrillation  Allergies  Allergen Reactions  . Ace Inhibitors Other (See Comments)    Causes high K   . Angiotensin Receptor Blockers Other (See Comments)    Causes high K   . Metoprolol Other (See Comments)    May cause elevated K level   . Penicillins Swelling and Rash    Has patient had a PCN reaction causing immediate rash, facial/tongue/throat swelling, SOB or lightheadedness with hypotension: YES Has patient had a PCN reaction causing severe rash involving mucus membranes or skin necrosis: NO Has patient had a PCN reaction that required hospitalization NO Has patient had a PCN reaction occurring within the last 10 years: NO If all of the above answers are "NO", then may proceed with Cephalosporin use. Pt has tolerated cephalosporins in the past   . Tetracycline Swelling and Rash    Patient Measurements: Height: 5\' 8"  (172.7 cm) Weight: 164 lb (74.39 kg) IBW/kg (Calculated) : 68.4  Vital Signs: Temp: 99.2 F (37.3 C) (07/18 0737) Temp Source: Oral (07/18 0737) BP: 116/87 mmHg (07/18 0737) Pulse Rate: 93 (07/18 0737)  Labs:  Recent Labs  12/26/15 0358 12/27/15 0440  HGB 11.3* 11.6*  HCT 34.3* 36.1*  PLT 317 339  LABPROT 21.7* 25.7*  INR 1.89* 2.37*  CREATININE 1.65* 1.84*    Estimated Creatinine Clearance: 33 mL/min (by C-G formula based on Cr of 1.84).    Assessment: 81 YOM continues on Coumadin from PTA for history of AFib.  INR dipped below therapeutic range 7/15 and Lovenox bridge added given recent ablation, d/c'd 7/17 with therapeutic INR. INR stable, 2.37>2.29; Hg low stable, plt wnl, no bleeding reported. Noted on levaquin.  Home dose: 2.5mg  daily except 5mg  MWF  Goal of Therapy:  INR 2-3 Anti-Xa level 0.6-1 units/ml 4hrs after LMWH dose given    Plan:  - Coumadin 5mg  PO x 1 dose tonight - Daily PT / INR - Monitor s/sx bleeding   Elicia Lamp, PharmD, BCPS Clinical Pharmacist Pager [phone] 12/28/2015 8:42 AM

## 2015-12-29 ENCOUNTER — Inpatient Hospital Stay (HOSPITAL_COMMUNITY): Payer: Medicare Other

## 2015-12-29 LAB — BASIC METABOLIC PANEL
Anion gap: 8 (ref 5–15)
BUN: 32 mg/dL — AB (ref 6–20)
CALCIUM: 9.7 mg/dL (ref 8.9–10.3)
CO2: 27 mmol/L (ref 22–32)
CREATININE: 1.6 mg/dL — AB (ref 0.61–1.24)
Chloride: 95 mmol/L — ABNORMAL LOW (ref 101–111)
GFR calc Af Amer: 47 mL/min — ABNORMAL LOW (ref >60)
GFR, EST NON AFRICAN AMERICAN: 40 mL/min — AB (ref >60)
GLUCOSE: 113 mg/dL — AB (ref 65–99)
POTASSIUM: 4.4 mmol/L (ref 3.5–5.1)
SODIUM: 130 mmol/L — AB (ref 135–145)

## 2015-12-29 LAB — GLUCOSE, CAPILLARY
GLUCOSE-CAPILLARY: 137 mg/dL — AB (ref 65–99)
GLUCOSE-CAPILLARY: 194 mg/dL — AB (ref 65–99)
GLUCOSE-CAPILLARY: 204 mg/dL — AB (ref 65–99)
Glucose-Capillary: 104 mg/dL — ABNORMAL HIGH (ref 65–99)

## 2015-12-29 LAB — CBC
HCT: 36.9 % — ABNORMAL LOW (ref 39.0–52.0)
Hemoglobin: 12 g/dL — ABNORMAL LOW (ref 13.0–17.0)
MCH: 29.6 pg (ref 26.0–34.0)
MCHC: 32.5 g/dL (ref 30.0–36.0)
MCV: 91.1 fL (ref 78.0–100.0)
PLATELETS: 350 10*3/uL (ref 150–400)
RBC: 4.05 MIL/uL — ABNORMAL LOW (ref 4.22–5.81)
RDW: 17.2 % — AB (ref 11.5–15.5)
WBC: 12.7 10*3/uL — ABNORMAL HIGH (ref 4.0–10.5)

## 2015-12-29 LAB — PROTIME-INR
INR: 2.16 — ABNORMAL HIGH (ref 0.00–1.49)
PROTHROMBIN TIME: 23.9 s — AB (ref 11.6–15.2)

## 2015-12-29 MED ORDER — WARFARIN SODIUM 5 MG PO TABS
5.0000 mg | ORAL_TABLET | Freq: Once | ORAL | Status: AC
  Administered 2015-12-29: 5 mg via ORAL
  Filled 2015-12-29: qty 1

## 2015-12-29 NOTE — Progress Notes (Addendum)
ANTICOAGULATION CONSULT NOTE - Follow Up Consult  Pharmacy Consult:  Coumadin Indication: h/o  atrial fibrillation  Allergies  Allergen Reactions  . Ace Inhibitors Other (See Comments)    Causes high K   . Angiotensin Receptor Blockers Other (See Comments)    Causes high K   . Metoprolol Other (See Comments)    May cause elevated K level   . Penicillins Swelling and Rash    Has patient had a PCN reaction causing immediate rash, facial/tongue/throat swelling, SOB or lightheadedness with hypotension: YES Has patient had a PCN reaction causing severe rash involving mucus membranes or skin necrosis: NO Has patient had a PCN reaction that required hospitalization NO Has patient had a PCN reaction occurring within the last 10 years: NO If all of the above answers are "NO", then may proceed with Cephalosporin use. Pt has tolerated cephalosporins in the past   . Tetracycline Swelling and Rash    Patient Measurements: Height: 5\' 8"  (172.7 cm) Weight: 157 lb 1.6 oz (71.26 kg) (b scale) IBW/kg (Calculated) : 68.4  Vital Signs: Temp: 98.1 F (36.7 C) (07/19 0420) Temp Source: Oral (07/19 0420) BP: 124/61 mmHg (07/19 0420) Pulse Rate: 84 (07/19 0420)  Labs:  Recent Labs  12/27/15 0440 12/28/15 0738 12/28/15 0849 12/29/15 0420  HGB 11.6* 11.7*  --  12.0*  HCT 36.1* 35.5*  --  36.9*  PLT 339 359  --  350  LABPROT 25.7*  --  25.0* 23.9*  INR 2.37*  --  2.29* 2.16*  CREATININE 1.84*  --   --  1.60*    Estimated Creatinine Clearance: 38 mL/min (by C-G formula based on Cr of 1.6).    Assessment: 19 YOM continues on Coumadin from PTA for history of AFib.  INR dipped below therapeutic range 7/15 and Lovenox bridge added given recent ablation, d/c'd 7/17 with therapeutic INR. INR therapeutic/ stable at 2.19; Hg low stable/sltight bump up, plt wnl. No bleeding reported. Noted on levaquin which could increase coumadin effect. Patient was also on Amiodarone PTA which was recently  discontinued on 7/17 per Cardiology due to concern for toxicity, acute hypoxemic respiratory failure. Thus coumadin dosage requirement may change since off amiodarone, depending on if levofloxacin effects coumadin.   Home dose: 2.5mg  daily except 5mg  MWF, last taken PTA on 7/11. INR was therapeutic on admission 7/12. Coumadin resumed here on 7/13.   Goal of Therapy:  INR 2-3 Anti-Xa level 0.6-1 units/ml 4hrs after LMWH dose given    Plan:  - Coumadin 5mg  PO x 1 dose tonight - Daily PT / INR - Monitor s/sx bleeding  Thank you for allowing pharmacy to be part of this patients care team. Nicole Cella, RPh Clinical Pharmacist Pager: [phone] 12/29/2015 11:01 AM

## 2015-12-29 NOTE — Progress Notes (Signed)
Patient on continuous pulse ox with saturations between 93-94.  Patient weaned to 4L oxygen and switched back to normal nasal cannula.  Patient alert to self and place but said it was [place] was president.  Patient falling back asleep frequently while talking and reaching and grabbing at the air when asleep.  Dr. Karleen Hampshire paged and made aware.  See new orders.  Will continue to monitor.

## 2015-12-29 NOTE — Consult Note (Signed)
Consultation Note Date: 12/29/2015   Patient Name: Curtis Frazier  DOB: April 04, 1941  MRN: [id number]  Age / Sex: 75 y.o., male  PCP: Abner Greenspan, MD Referring Physician: Hosie Poisson, MD  Reason for Consultation: Establishing goals of care  HPI/Patient Profile: 75 y.o. male  with past medical history of AFib, CAD, DM, MI, CHF (compensated, EF 55-60%), HTN, CKD III admitted on 12/22/2015 with c/o cough, pleuritic pain and SOB. He was found to have health care associated pneumonia. Admitted for treatment. Consult to Palliative Medicine Team was requested for clarification of goals of care.  [name] has had several hospital admissions in the past 6 months. He had a lumbar back surgery at the beginning of the year, followed by pneumonia/sepsis in February that resulted in respiratory failure and bounced back and forth between CIR and acute care due to ongoing cardiac and respiratory problems. He eventually was discharged home with home health in April, 2017. In late April he had another admission to [facility] with acute kidney injury and leucocytosis. He is now admitted with HCAP. Recent CT scan also showed interval progression of mediastinal and right hilar lympadenopathy that was concerning for underlying neoplasm.  Clinical Assessment and Goals of Care: Patient was seen in his room today. He was noticeably SOB and slightly confused. When asked he stated that he wants CPR and mechanical ventilation "if it will do any good". I was not able to get into deeper conversation with him due to his mental state. I have planned a family meeting for tomorrow to further discuss [place] and advance healthcare planning.  NEXT OF KIN- daughter, Curtis Frazier, spouse- Curtis Frazier, sons- Curtis Frazier and Curtis Frazier.    SUMMARY OF RECOMMENDATIONS   Family meeting to be held tomorrow to discuss [place] and Teacher, English as a foreign language.  Code Status/Advance Care Planning:  Full code - for now. Plan to discuss DNR status with family tomorrow.   Palliative Prophylaxis:   Delirium Protocol   Psycho-social/Spiritual:   Desire for further Chaplaincy support:NO   Prognosis:   < 6 months d/t significant decline in functional status, comorbidities, and frequent hospitalizations  Discharge Planning: To Be Determined- Given patient's functional status, frequent hospitalizations, and significant symptoms from chronic illness, I suspect he will be Hospice eligible. Will discuss this with family tomorrow.      Primary Diagnoses: Present on Admission:  . HCAP (healthcare-associated pneumonia) . Hyponatremia . Coronary atherosclerosis . Rheumatoid arthritis ([place]) . PMR (polymyalgia rheumatica) (HCC) . Back pain without radiation . Chronic diastolic congestive heart failure ([place]) . Paroxysmal atrial fibrillation (HCC) . Diabetes mellitus with complication ([place]) . Kidney disease, chronic, stage III (GFR 30-59 ml/min) . Hyperlipidemia . GERD (gastroesophageal reflux disease) . Hypoxia . Acute on chronic diastolic HF (heart failure) ([place]) . Atrial fibrillation with RVR ([place]) . Atrial flutter ([place])  I have reviewed the medical record, interviewed the patient and family, and examined the patient. The following aspects are pertinent.  Past Medical History  Diagnosis Date  .  CAD (coronary artery disease)     a. 2010 s/p MI/Cath/PCI ([facility]): LM 10, LAD 30p, 32m, D1 20, D2 30, LCX 30p, 57m (3.5 x 15 Driver BMS), [id number], RCA 70p, 48m; b. 04/2011 Neg MV;  c. 04/2015 Myoview: very mild apical thinning, no ischemia, EF 65%.  . Hyperlipidemia     a. takes pravachol.  . Personal history of colonic polyps   . Actinic keratosis   . Psoriatic arthropathy ([place])   . Other psoriasis   . Scoliosis (and kyphoscoliosis), idiopathic   . GI bleed     a. 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  . Gastritis 12/10  . Hypertensive  heart disease     a. takes Metoprolol daily  . Dyspnea on exertion   . Joint pain   . Chronic back pain     scoliosis/stenosis/radiculopathy,degenerative disc disease  . Psoriasis   . Bruises easily   . Esophageal reflux     takes Omeprazole daily  . History of colonic polyps   . Hemorrhoids   . History of blood transfusion 2010    "don't remember why"  . PMR (polymyalgia rheumatica) (HCC) 01/20/2012    tx by specialist on a long prednisone taper  . Chronic diastolic CHF (congestive heart failure) ([place])     a. 08/2015 Echo: EF 55-60%, mild LVH, no rwma, Gr1 DD, mod AS, mildly dil LA, PASP 42mmHg.  . Moderate aortic stenosis     a. 08/2015 Mod AS, valve area (VTI) - 1.5cm^2, (Vmax) - 1.25cm^2; b. 10/2015 TEE EF 55-60%, mod AS.  Marland Kitchen Paroxysmal atrial fibrillation ([place])     a. 06/2011 Post-op AF-->converted on amio;  b. 07/2015 TEE/unsuccessful DCCV x 2; c. 09/2015 recurrent AF/Flutter-->successful DCCV; d. 09/2015 Recurrent AF-->10/2015 s/p TEE (EF 55-60%, mod AS, mild MR, sev dil LA w/o LAA thrombus, sev dil RA, mild to mod TR) & DCCV (120 J x 1).  . Lumbar stenosis     a. s/p lumbar diskectomy 2017.  . Carotid arterial disease ([place])     a. 10/2015 Carotid US: bilat 1-39% heterogeneous plaque/stenosis. Nl subclavian arteries bilat.  Marland Kitchen STEMI (ST elevation myocardial infarction) ([place]) 02/2010    Archie Endo 05/2009  . Pneumonia 2008; 07/2015  . COPD (chronic obstructive pulmonary disease) ([place]) dx'd 12/10/2015  . Sleep apnea     "had it while in hospital in 07/2015" (12/15/2015)  . Type II diabetes mellitus ([place])   . Rheumatoid arthritis(714.0)     takes Methotrexate 7pills weekly  . Degeneration of intervertebral disc, site unspecified   . Arthritis     "hips" (12/15/2015)  . CKD (chronic kidney disease), stage III     a. 09/2015 AKI in setting of dehydration - seen @ [facility].  . Nephrolithiasis   . Skin cancer     "arms; they freeze then off; cut one off left elbow recently" (12/15/2015)   Social History    Social History  . Marital Status: Married    Spouse Name: N/A  . Number of Children: N/A  . Years of Education: N/A   Social History Main Topics  . Smoking status: Former Smoker -- 1.50 packs/day for 53 years    Types: Cigarettes    Quit date: 11/22/2008  . Smokeless tobacco: Never Used  . Alcohol Use: 0.0 oz/week    0 Standard drinks or equivalent per week     Comment: 7/5//2017 "might have 1-2 margaritas in the summertime"  . Drug Use: No  . Sexual Activity: No  Other Topics Concern  . None   Social History Narrative   Family History  Problem Relation Age of Onset  . Coronary artery disease Mother   . Diabetes Mother   . CAD Mother   . Coronary artery disease Sister   . Hypertension Sister   . Kidney failure Sister   . CAD Sister   . Diabetes Brother   . Prostate cancer Brother   . Pancreatic cancer Brother   . Diabetes Brother   . Anesthesia problems Neg Hx   . Hypotension Neg Hx   . Malignant hyperthermia Neg Hx   . Pseudochol deficiency Neg Hx    Scheduled Meds: . calcium-vitamin D  1 tablet Oral Q breakfast  . dextromethorphan-guaiFENesin  2 tablet Oral BID  . diltiazem  240 mg Oral Daily  . docusate sodium  100 mg Oral BID  . ferrous sulfate  325 mg Oral TID WC  . insulin aspart  0-15 Units Subcutaneous TID WC  . insulin aspart  0-5 Units Subcutaneous QHS  . insulin aspart  3 Units Subcutaneous TID WC  . insulin detemir  15 Units Subcutaneous Daily  . levofloxacin  750 mg Oral Q48H  . pantoprazole  40 mg Oral Daily  . pravastatin  20 mg Oral QPM  . predniSONE  5 mg Oral Q breakfast  . thiamine  100 mg Oral Daily  . warfarin  5 mg Oral ONCE-1800  . Warfarin - Pharmacist Dosing Inpatient   Does not apply q1800   Continuous Infusions:  PRN Meds:.acetaminophen, albuterol, ondansetron (ZOFRAN) IV Medications Prior to Admission:  Prior to Admission medications   Medication Sig Start Date End Date Taking? Authorizing Provider  acetaminophen  (TYLENOL) 325 MG tablet Take 650 mg by mouth every 6 (six) hours as needed (Pain scale of 1-4 or temp > 101).   Yes Historical Provider, MD  albuterol (PROVENTIL HFA;VENTOLIN HFA) 108 (90 Base) MCG/ACT inhaler Inhale 2 puffs into the lungs every 6 (six) hours as needed for wheezing or shortness of breath. 11/10/15  Yes Abner Greenspan, MD  amiodarone (PACERONE) 200 MG tablet Take 1 tablet (200 mg total) by mouth 2 (two) times daily. 11/10/15  Yes Rogelia Mire, NP  calcium-vitamin D (OSCAL WITH D) 500-200 MG-UNIT tablet Take 1 tablet by mouth daily with breakfast.   Yes Historical Provider, MD  diltiazem (CARDIZEM CD) 240 MG 24 hr capsule TAKE 1 CAPSULE BY MOUTH DAILY 10/14/15  Yes Abner Greenspan, MD  docusate sodium (COLACE) 100 MG capsule Take 100 mg by mouth 2 (two) times daily. Reported on 09/22/2015   Yes Historical Provider, MD  ferrous sulfate 325 (65 FE) MG tablet Take 1 tablet (325 mg total) by mouth 3 (three) times daily with meals. 10/26/15  Yes Abner Greenspan, MD  glipiZIDE (GLUCOTROL) 10 MG tablet Take 1 tablet (10 mg total) by mouth 2 (two) times daily before a meal. 10/29/15  Yes Abner Greenspan, MD  pantoprazole (PROTONIX) 40 MG tablet Take 1 tablet (40 mg total) by mouth daily. 10/11/15  Yes Tonia Ghent, MD  pravastatin (PRAVACHOL) 20 MG tablet Take 1 tablet (20 mg total) by mouth every evening. 11/10/15  Yes Rogelia Mire, NP  predniSONE (DELTASONE) 5 MG tablet Take 1 tablet (5 mg total) by mouth daily with breakfast. 12/03/15  Yes Abner Greenspan, MD  thiamine 100 MG tablet Take 1 tablet (100 mg total) by mouth daily. 10/11/15  Yes Tonia Ghent, MD  tiotropium (  SPIRIVA) 18 MCG inhalation capsule Place 1 capsule (18 mcg total) into inhaler and inhale daily. 12/07/15  Yes Jearld Fenton, NP  warfarin (COUMADIN) 2.5 MG tablet Take 2.5-5 mg by mouth See admin instructions. Take 2 tablets on Monday, Wednesday and Friday then take 1 tablet all the other days   Yes Historical Provider, MD    glucose blood (ONE TOUCH ULTRA TEST) test strip Check blood sugar three times daily and as directed. Dx E11.8 diabetes that is poorly controlled 11/15/15   Abner Greenspan, MD  warfarin (COUMADIN) 2.5 MG tablet TAKE AS DIRECTED BY ANTI-COAGULATION CLINIC. Patient not taking: Reported on 12/23/2015 12/21/15   Abner Greenspan, MD   Allergies  Allergen Reactions  . Ace Inhibitors Other (See Comments)    Causes high K   . Angiotensin Receptor Blockers Other (See Comments)    Causes high K   . Metoprolol Other (See Comments)    May cause elevated K level   . Penicillins Swelling and Rash    Has patient had a PCN reaction causing immediate rash, facial/tongue/throat swelling, SOB or lightheadedness with hypotension: YES Has patient had a PCN reaction causing severe rash involving mucus membranes or skin necrosis: NO Has patient had a PCN reaction that required hospitalization NO Has patient had a PCN reaction occurring within the last 10 years: NO If all of the above answers are "NO", then may proceed with Cephalosporin use. Pt has tolerated cephalosporins in the past   . Amiodarone Other (See Comments)    12/27/15: Cardiologist discontinued the amiodarone (on PTA) due to  concern for toxicity  (in hospitial with acute hypoxemic respiratory failure)   . Tetracycline Swelling and Rash   Review of Systems  Unable to perform ROS: Acuity of condition    Physical Exam  Constitutional: He appears well-developed.  Slightly distressed, sitting up in chair  HENT:  Head: Normocephalic and atraumatic.  Cardiovascular: Normal rate.   Pulmonary/Chest: He is in respiratory distress.  diminished  Abdominal: Soft. Bowel sounds are normal.  Neurological:  answered questions appropriately, oriented only to person  Skin: Skin is warm and dry. There is erythema.  Psychiatric:  Flat affect, hallucinating (saw his daughter outside the door)    Vital Signs: BP 117/58 mmHg  Pulse 97  Temp(Src) 98.5 F  (36.9 C) (Oral)  Resp 18  Ht 5\' 8"  (1.727 m)  Wt 71.26 kg (157 lb 1.6 oz)  BMI 23.89 kg/m2  SpO2 94% Pain Assessment: No/denies pain POSS *See Group Information*: 1-Acceptable,Awake and alert Pain Score: 0-No pain   SpO2: SpO2: 94 % O2 Device:SpO2: 94 % O2 Flow Rate: .O2 Flow Rate (L/min): 2 L/min  IO: Intake/output summary:  Intake/Output Summary (Last 24 hours) at 12/29/15 1400 Last data filed at 12/29/15 0900  Gross per 24 hour  Intake    820 ml  Output    625 ml  Net    195 ml    LBM: Last BM Date: 12/27/15 Baseline Weight: Weight: 74.39 kg (164 lb) Most recent weight: Weight: 71.26 kg (157 lb 1.6 oz) (b scale)     Palliative Assessment/Data: 50%   Flowsheet Rows        Most Recent Value   Intake Tab    Referral Department  Hospitalist   Unit at Time of Referral  Cardiac/Telemetry Unit   Palliative Care Primary Diagnosis  Cardiac   Date Notified  12/29/15   Palliative Care Type  Return patient Palliative Care   Reason  for referral  Clarify Goals of Care   Date of Admission  12/22/15   # of days IP prior to Palliative referral  7   Clinical Assessment    Psychosocial & Spiritual Assessment    Palliative Care Outcomes      Thank you for allowing the palliative medicine team to assist with this patient. Will continue to follow and assist with goals of care and advanced care planning.  Time In: 1330 Time Out: 1400 Time Total: 30 minutes Greater than 50%  of this time was spent counseling and coordinating care related to the above assessment and plan.  Signed by: Mariana Kaufman, AGNP-C [phone]   Please contact Palliative Medicine Team phone at [phone] for questions and concerns.  For individual provider: See Shea Evans

## 2015-12-29 NOTE — Progress Notes (Signed)
PROGRESS NOTE    Curtis Frazier  [id number] DOB: 1940-10-21 DOA: 12/22/2015 PCP: Loura Pardon, MD    Brief Narrative: Patient is a 63 year gentleman history of hyperlipidemia, diabetes, chronic kidney disease stage III, diastolic CHF, paroxysmal atrial fibrillation on Coumadin/atrial flutter status post recent CTI ablation per cardiology 12/15/2015 who presents to the ED with a 2 day history of cough, shortness of breath, chest pain pleuritic in nature with subjective fever and noted per chest x-ray to have a right middle and right lower lobe pneumonia.  Assessment & Plan:   Principal Problem:   Acute respiratory failure with hypoxia (HCC) Active Problems:   Coronary atherosclerosis   Rheumatoid arthritis (HCC)   PMR (polymyalgia rheumatica) (HCC)   Back pain without radiation   Chronic diastolic congestive heart failure (HCC)   Paroxysmal atrial fibrillation (HCC)   Diabetes mellitus with complication (HCC)   Kidney disease, chronic, stage III (GFR 30-59 ml/min)   Atrial flutter (HCC)   Acute on chronic diastolic HF (heart failure) (HCC)   Atrial fibrillation with RVR (HCC)   Hyperlipidemia   Hypoxia   HCAP (healthcare-associated pneumonia)   Hyponatremia   GERD (gastroesophageal reflux disease)   Healthcare-associated pneumonia   Acute respiratory failure with hypoxia possibly from a combination of health care associated pneumonia and acute on chronic diastolic heart failure, vs amiodarone toxicity. Started on broad spectrum IV antibiotics and transitioned to levaquin to complete the course.  Weaned him down to 2 lit [place] oxygen today.  [place] oxygen to keep sats around 88 to 91%.  duonebs as needed.  CT chest negative for PE.  Meanwhile he is also being treated for acute onchronic diastolic heart failure with IV lasix.  No further lasix as per cardiology, and amiodarone discontinued for now, .  Appreciate cardiology and pulmonary recommendations.  Outpatient follow up .     Diabetes Mellitus: CBG (last 3)   Recent Labs  12/29/15 0533 12/29/15 1114 12/29/15 1604  GLUCAP 137* 194* 204*    Resume the same regimen.   Paroxysmal atrial fibrillation: in sinus, and on cardizem and coumadin for anticoagulation.     Acute encephalopathy possibly toxic encephalopathy from respiratory failure.  Get UA , rule out UTI.   In view of his medical co morbidities,decoditioned stage, requested palliative care consult for goals of care.    DVT prophylaxis: coumadin Code Status: (Full/) Family Communication: none at bedside. Disposition Plan: pending further eval.    Consultants:   Cardiology  pulmonology   Procedures: none   Antimicrobials:levaquin   Subjective: No new complaints.   Objective: Filed Vitals:   12/28/15 2020 12/29/15 0025 12/29/15 0420 12/29/15 0849  BP:  115/52 124/61   Pulse:  80 84   Temp:  98.5 F (36.9 C) 98.1 F (36.7 C)   TempSrc:  Oral Oral   Resp:  18 17   Height:      Weight:   71.26 kg (157 lb 1.6 oz)   SpO2: 81%  93% 93%    Intake/Output Summary (Last 24 hours) at 12/29/15 0940 Last data filed at 12/29/15 0600  Gross per 24 hour  Intake    580 ml  Output   1175 ml  Net   -595 ml   Filed Weights   12/22/15 1729 12/29/15 0420  Weight: 74.39 kg (164 lb) 71.26 kg (157 lb 1.6 oz)    Examination:  General exam: Appears i mild distress , on 2 lit of [place] oxygen.  Respiratory system:  bibasilar rales. No wheezing or rhonchi.  Cardiovascular system: S1 & S2 heard, Gastrointestinal system: Abdomen is nondistended, soft and nontender. No organomegaly or masses felt. Normal bowel sounds heard. Central nervous system: Alert but confused.  Skin: No rashes, lesions or ulcers     Data Reviewed: I have personally reviewed following labs and imaging studies  CBC:  Recent Labs Lab 12/22/15 1747  12/25/15 0355 12/26/15 0358 12/27/15 0440 12/28/15 0738 12/29/15 0420  WBC 10.8*  < > 11.5* 12.2* 11.5*  13.3* 12.7*  NEUTROABS 8.1*  --   --  9.3*  --   --   --   HGB 12.4*  < > 12.4* 11.3* 11.6* 11.7* 12.0*  HCT 38.9*  < > 38.6* 34.3* 36.1* 35.5* 36.9*  MCV 91.3  < > 91.0 90.0 89.8 89.4 91.1  PLT 277  < > 315 317 339 359 350  < > = values in this interval not displayed. Basic Metabolic Panel:  Recent Labs Lab 12/24/15 0308 12/25/15 0355 12/26/15 0358 12/27/15 0440 12/29/15 0420  NA 133* 134* 129* 129* 130*  K 4.2 3.9 3.9 4.0 4.4  CL 101 100* 95* 96* 95*  CO2 23 25 25 25 27   GLUCOSE 112* 124* 242* 185* 113*  BUN 18 18 26* 32* 32*  CREATININE 1.19 1.46* 1.65* 1.84* 1.60*  CALCIUM 8.9 9.4 9.1 9.2 9.7  MG  --  1.4* 1.7  --   --    GFR: Estimated Creatinine Clearance: 38 mL/min (by C-G formula based on Cr of 1.6). Liver Function Tests: No results for input(s): AST, ALT, ALKPHOS, BILITOT, PROT, ALBUMIN in the last 168 hours. No results for input(s): LIPASE, AMYLASE in the last 168 hours. No results for input(s): AMMONIA in the last 168 hours. Coagulation Profile:  Recent Labs Lab 12/25/15 0355 12/26/15 0358 12/27/15 0440 12/28/15 0849 12/29/15 0420  INR 1.60* 1.89* 2.37* 2.29* 2.16*   Cardiac Enzymes: No results for input(s): CKTOTAL, CKMB, CKMBINDEX, TROPONINI in the last 168 hours. BNP (last 3 results) No results for input(s): PROBNP in the last 8760 hours. HbA1C: No results for input(s): HGBA1C in the last 72 hours. CBG:  Recent Labs Lab 12/28/15 0733 12/28/15 1217 12/28/15 1621 12/28/15 2101 12/29/15 0533  GLUCAP 140* 195* 232* 241* 137*   Lipid Profile: No results for input(s): CHOL, HDL, LDLCALC, TRIG, CHOLHDL, LDLDIRECT in the last 72 hours. Thyroid Function Tests: No results for input(s): TSH, T4TOTAL, FREET4, T3FREE, THYROIDAB in the last 72 hours. Anemia Panel: No results for input(s): VITAMINB12, FOLATE, FERRITIN, TIBC, IRON, RETICCTPCT in the last 72 hours. Sepsis Labs:  Recent Labs Lab 12/22/15 2309 12/26/15 1356 12/28/15 0738   PROCALCITON  --  0.43 0.46  LATICACIDVEN 0.95  --   --     Recent Results (from the past 240 hour(s))  Blood culture (routine x 2)     Status: None   Collection Time: 12/22/15 10:55 PM  Result Value Ref Range Status   Specimen Description BLOOD RIGHT ANTECUBITAL  Final   Special Requests BOTTLES DRAWN AEROBIC AND ANAEROBIC 5CC  Final   Culture NO GROWTH 5 DAYS  Final   Report Status 12/28/2015 FINAL  Final  Blood culture (routine x 2)     Status: None   Collection Time: 12/22/15 11:00 PM  Result Value Ref Range Status   Specimen Description BLOOD RIGHT HAND  Final   Special Requests IN PEDIATRIC BOTTLE 2CC  Final   Culture NO GROWTH 5 DAYS  Final  Report Status 12/28/2015 FINAL  Final  MRSA PCR Screening     Status: None   Collection Time: 12/23/15  7:25 AM  Result Value Ref Range Status   MRSA by PCR NEGATIVE NEGATIVE Final    Comment:        The GeneXpert MRSA Assay (FDA approved for NASAL specimens only), is one component of a comprehensive MRSA colonization surveillance program. It is not intended to diagnose MRSA infection nor to guide or monitor treatment for MRSA infections.   Culture, sputum-assessment     Status: None   Collection Time: 12/23/15  5:15 PM  Result Value Ref Range Status   Specimen Description SPUTUM  Final   Special Requests NONE  Final   Sputum evaluation   Final    MICROSCOPIC FINDINGS SUGGEST THAT THIS SPECIMEN IS NOT REPRESENTATIVE OF LOWER RESPIRATORY SECRETIONS. PLEASE RECOLLECT. Deloris Ping RN [id number] 12/23/15 A BROWNING    Report Status 12/23/2015 FINAL  Final  Gram stain     Status: None   Collection Time: 12/24/15 11:25 PM  Result Value Ref Range Status   Specimen Description EXPECTORATED SPUTUM  Final   Special Requests NONE  Final   Gram Stain   Final    MODERATE WBC PRESENT,BOTH PMN AND MONONUCLEAR RARE SQUAMOUS EPITHELIAL CELLS PRESENT FEW GRAM POSITIVE COCCI FEW GRAM VARIABLE ROD    Report Status 12/25/2015 FINAL   Final  Respiratory Panel by PCR     Status: None   Collection Time: 12/25/15  9:45 AM  Result Value Ref Range Status   Adenovirus NOT DETECTED NOT DETECTED Final   Coronavirus 229E NOT DETECTED NOT DETECTED Final   Coronavirus HKU1 NOT DETECTED NOT DETECTED Final   Coronavirus NL63 NOT DETECTED NOT DETECTED Final   Coronavirus OC43 NOT DETECTED NOT DETECTED Final   Metapneumovirus NOT DETECTED NOT DETECTED Final   Rhinovirus / Enterovirus NOT DETECTED NOT DETECTED Final   Influenza A NOT DETECTED NOT DETECTED Final   Influenza A H1 NOT DETECTED NOT DETECTED Final   Influenza A H1 2009 NOT DETECTED NOT DETECTED Final   Influenza A H3 NOT DETECTED NOT DETECTED Final   Influenza B NOT DETECTED NOT DETECTED Final   Parainfluenza Virus 1 NOT DETECTED NOT DETECTED Final   Parainfluenza Virus 2 NOT DETECTED NOT DETECTED Final   Parainfluenza Virus 3 NOT DETECTED NOT DETECTED Final   Parainfluenza Virus 4 NOT DETECTED NOT DETECTED Final   Respiratory Syncytial Virus NOT DETECTED NOT DETECTED Final   Bordetella pertussis NOT DETECTED NOT DETECTED Final   Chlamydophila pneumoniae NOT DETECTED NOT DETECTED Final   Mycoplasma pneumoniae NOT DETECTED NOT DETECTED Final         Radiology Studies: Dg Chest 2 View  12/28/2015  CLINICAL DATA:  Shortness of breath.  Pneumonia. EXAM: CHEST  2 VIEW COMPARISON:  Chest CT 12/25/2015. Chest x-ray 12/24/2015, 10/11/2015. FINDINGS: Persistent bilateral hilar fullness noted consistent previously identified adenopathy. Cardiomegaly. Diffuse pulmonary interstitial prominence again noted. Active interstitial lung disease and/or interstitial edema could present this fashion. No pleural effusion or pneumothorax. Prior thoracolumbar spine fusion. Thoracolumbar scoliosis. IMPRESSION: 1. Persistent bilateral hilar fullness noted consistent with previously identified adenopathy. Reference is made to chest CT report 12/25/2015. 2.  Stable cardiomegaly. 3. Persistent  bilateral pulmonary interstitial prominence consistent with active interstitial lung disease and/or interstitial edema. Electronically Signed   By: Marcello Moores  Register   On: 12/28/2015 07:28   Dg Chest Port 1 View  12/29/2015  CLINICAL DATA:  Hypoxia  with congestion for 1 day EXAM: PORTABLE CHEST 1 VIEW COMPARISON:  Chest radiographs December 28, 2015 and Oct 11, 2015 ; chest CT December 25, 2015 FINDINGS: There is diffuse interstitial and patchy alveolar opacities, likely pulmonary edema. There is focal consolidation in the left lower lobe, stable from 1 day prior. No new opacity is evident. Heart size is upper normal. There is pulmonary venous hypertension. There is evidence of hilar adenopathy bilaterally, stable. No pneumothorax. There is postoperative change in the lower thoracic and visualized upper lumbar spine. IMPRESSION: Findings felt to be most consistent with congestive heart failure, stable from 1 day prior. Suspect superimposed pneumonia left lower lobe. There may be a degree of underlying ARDS as well. More than one of these entities may exist concurrently. Stable cardiac silhouette. Evidence of hilar adenopathy bilaterally, better seen on recent CT examination. Concern for underlying neoplasm. Electronically Signed   By: Lowella Grip III M.D.   On: 12/29/2015 08:59        Scheduled Meds: . calcium-vitamin D  1 tablet Oral Q breakfast  . dextromethorphan-guaiFENesin  2 tablet Oral BID  . diltiazem  240 mg Oral Daily  . docusate sodium  100 mg Oral BID  . ferrous sulfate  325 mg Oral TID WC  . insulin aspart  0-15 Units Subcutaneous TID WC  . insulin aspart  0-5 Units Subcutaneous QHS  . insulin aspart  3 Units Subcutaneous TID WC  . insulin detemir  15 Units Subcutaneous Daily  . levofloxacin  750 mg Oral Q48H  . pantoprazole  40 mg Oral Daily  . pravastatin  20 mg Oral QPM  . predniSONE  5 mg Oral Q breakfast  . thiamine  100 mg Oral Daily  . Warfarin - Pharmacist Dosing Inpatient    Does not apply q1800   Continuous Infusions:    LOS: 6 days    Time spent: 40 minutes    Tammela Bales, MD [facility] Pager [phone]  If 7PM-7AM, please contact night-coverage [email or web address] Password [email or web address] 12/29/2015, 9:40 AM

## 2015-12-29 NOTE — Progress Notes (Signed)
Physical Therapy Treatment Patient Details Name: Curtis Frazier MRN: [id number] DOB: [birth year] Today's Date: 12/29/2015    History of Present Illness Pt adm with HCAP. PMH - CAD, DM, MI, CHF, GI bleed, HTN, afib    PT Comments    Pt continues to demonstrate difficulty using pursed lip breathing technique, requiring constant cues.  Additionally, pt with poor balance sitting EOB requiring up to min assist and fatigues quickly.  Upon standing and initiating pivot to chair pt incontinent of loose stool, RN notified. In light of this functional decline, and if pt does not improve to prior functioning he may need ST SNF, will continue to follow.  SW made aware.  SpO2 remained in low-mid 90s throughout session. Recommending Cardiopulmonary Rehab as well.   Follow Up Recommendations  Outpatient PT;Other (comment);SNF (Cardiopulmonary Rehab)     Equipment Recommendations  None recommended by PT    Recommendations for Other Services       Precautions / Restrictions Precautions Precautions: Fall;Other (comment) Precaution Comments: monitor O2 Restrictions Weight Bearing Restrictions: No    Mobility  Bed Mobility Overal bed mobility: Needs Assistance Bed Mobility: Supine to Sit     Supine to sit: Min assist;HOB elevated     General bed mobility comments: Increased time and min assist to scoot to EOB. Pt fatigues very quickly and demonstrates posterior lean.  Transfers Overall transfer level: Needs assistance Equipment used: Rolling walker (2 wheeled) Transfers: Sit to/from Stand Sit to Stand: Min guard Stand pivot transfers: Min guard       General transfer comment: Cues for hand placement and pt is slow to stand.  As pt begins to pivot he is incontinent of loose stool. When asked about it he says, "I couldn't tell it was coming".  Notified RN.  Ambulation/Gait                 Stairs            Wheelchair Mobility    Modified Rankin (Stroke Patients Only)        Balance Overall balance assessment: Needs assistance Sitting-balance support: Single extremity supported;Feet supported Sitting balance-Leahy Scale: Poor Sitting balance - Comments: Pt with difficulty maintaining upright sitting EOB today.  He requires as least 1 UE supported due to posterior lean but even then spontaneously will lie back supine due to fatigue and reports of Lt hip aching.  He requires up to min assist to maintain upright. Postural control: Posterior lean Standing balance support: Bilateral upper extremity supported;During functional activity Standing balance-Leahy Scale: Poor Standing balance comment: Relies on UE support from RW                    Cognition Arousal/Alertness: Awake/alert Behavior During Therapy: WFL for tasks assessed/performed Overall Cognitive Status: Impaired/Different from baseline Area of Impairment: Problem solving             Problem Solving: Slow processing      Exercises General Exercises - Lower Extremity Ankle Circles/Pumps: AROM;Both;10 reps;Supine Straight Leg Raises: AROM;Both;10 reps;Supine Other Exercises Other Exercises: Encouraged pt to continue practicing pursed lip breathing technique    General Comments General comments (skin integrity, edema, etc.): Pt continues to demonstrate difficulty using pursed lip breathing technique, requiring constant cues.  Additionally, pt with poor balance sitting EOB and fatigues quickly.  Given these new presentations, updating recommendations to include Cardiopulmonary Rehab.  If pt does not improve to prior functioning he may need ST SNF, will continue to follow.  Pertinent Vitals/Pain Pain Assessment: Faces Faces Pain Scale: Hurts little more Pain Location: Lt hip Pain Descriptors / Indicators: Grimacing Pain Intervention(s): Limited activity within patient's tolerance;Monitored during session;Repositioned    Home Living                      Prior  Function            PT Goals (current goals can now be found in the care plan section) Acute Rehab PT Goals Patient Stated Goal: none stated PT Goal Formulation: With patient Time For Goal Achievement: 12/30/15 Potential to Achieve Goals: Good Progress towards PT goals: Not progressing toward goals - comment (due to fatigue)    Frequency  Min 3X/week    PT Plan Discharge plan needs to be updated    Co-evaluation             End of Session Equipment Utilized During Treatment: Gait belt;Oxygen Activity Tolerance: Patient limited by fatigue Patient left: with call bell/phone within reach;in chair;with nursing/sitter in room;Other (comment) (with NT )     Time: [id number] PT Time Calculation (min) (ACUTE ONLY): 21 min  Charges:  $Therapeutic Activity: 8-22 mins                    G Codes:       Collie Siad PT, DPT  Pager: [phone] Phone: [phone] 12/29/2015, 9:53 AM

## 2015-12-29 NOTE — Care Management Important Message (Signed)
Important Message  Patient Details  Name: Curtis Frazier MRN: [id number] Date of Birth: May 25, 1941   Medicare Important Message Given:  Yes    Loann Quill 12/29/2015, 10:04 AM

## 2015-12-30 ENCOUNTER — Ambulatory Visit: Payer: Medicare Other | Admitting: *Deleted

## 2015-12-30 LAB — GLUCOSE, CAPILLARY
GLUCOSE-CAPILLARY: 167 mg/dL — AB (ref 65–99)
Glucose-Capillary: 104 mg/dL — ABNORMAL HIGH (ref 65–99)
Glucose-Capillary: 125 mg/dL — ABNORMAL HIGH (ref 65–99)
Glucose-Capillary: 73 mg/dL (ref 65–99)

## 2015-12-30 LAB — ALBUMIN: ALBUMIN: 2.1 g/dL — AB (ref 3.5–5.0)

## 2015-12-30 LAB — PROTIME-INR
INR: 2.45 — AB (ref 0.00–1.49)
PROTHROMBIN TIME: 26.3 s — AB (ref 11.6–15.2)

## 2015-12-30 LAB — PROCALCITONIN: Procalcitonin: 0.38 ng/mL

## 2015-12-30 MED ORDER — WARFARIN SODIUM 2.5 MG PO TABS
2.5000 mg | ORAL_TABLET | Freq: Once | ORAL | Status: AC
  Administered 2015-12-30: 2.5 mg via ORAL
  Filled 2015-12-30: qty 1

## 2015-12-30 NOTE — Progress Notes (Signed)
PROGRESS NOTE    Curtis Frazier  [id number] DOB: 11/26/1940 DOA: 12/22/2015 PCP: Loura Pardon, MD    Brief Narrative: Patient is a 57 year gentleman history of hyperlipidemia, diabetes, chronic kidney disease stage III, diastolic CHF, paroxysmal atrial fibrillation on Coumadin/atrial flutter status post recent CTI ablation per cardiology 12/15/2015 who presents to the ED with a 2 day history of cough, shortness of breath, chest pain pleuritic in nature with subjective fever and noted per chest x-ray to have a right middle and right lower lobe pneumonia.  Assessment & Plan:   Principal Problem:   Acute respiratory failure with hypoxia (HCC) Active Problems:   Coronary atherosclerosis   Rheumatoid arthritis (HCC)   PMR (polymyalgia rheumatica) (HCC)   Back pain without radiation   Chronic diastolic congestive heart failure (HCC)   Paroxysmal atrial fibrillation (HCC)   Diabetes mellitus with complication (HCC)   Kidney disease, chronic, stage III (GFR 30-59 ml/min)   Atrial flutter (HCC)   Acute on chronic diastolic HF (heart failure) (HCC)   Atrial fibrillation with RVR (HCC)   Hyperlipidemia   Hypoxia   HCAP (healthcare-associated pneumonia)   Hyponatremia   GERD (gastroesophageal reflux disease)   Healthcare-associated pneumonia   Acute respiratory failure with hypoxia possibly from a combination of health care associated pneumonia and acute on chronic diastolic heart failure, vs amiodarone toxicity. Started on broad spectrum IV antibiotics and transitioned to levaquin to complete the course.  Weaned him down to 2 lit [place] oxygen on 7/19 and would remain on the same.  [place] oxygen to keep sats around 88 to 91%.  duonebs as needed.  CT chest negative for PE.  Meanwhile he is also being treated for acute onchronic diastolic heart failure with IV lasix.  No further lasix as per cardiology, and amiodarone discontinued for now, .  Appreciate cardiology and pulmonary recommendations.    Outpatient follow up .    Diabetes Mellitus: CBG (last 3)   Recent Labs  12/29/15 2139 12/30/15 0610 12/30/15 1153  GLUCAP 104* 104* 167*    Resume the same regimen.   Paroxysmal atrial fibrillation: in sinus, and on cardizem and coumadin for anticoagulation.  Rate controlled.     Acute encephalopathy possibly toxic encephalopathy from respiratory failure.  Get UA , rule out UTI.   In view of his medical co morbidities,decoditioned stage, requested palliative care consult for goals of care. Currently awaiting recommendations regarding disposition.    DVT prophylaxis: coumadin Code Status: (Full/) Family Communication: discussed with son at bedside. Disposition Plan: pending further eval.    Consultants:   Cardiology  Pulmonology  Palliative care.    Procedures: none   Antimicrobials:levaquin   Subjective: Reports exhausted.   Objective: Filed Vitals:   12/30/15 0010 12/30/15 0446 12/30/15 1117 12/30/15 1152  BP: 122/51 116/54  108/54  Pulse: 82 91  89  Temp: 98.9 F (37.2 C) 98.8 F (37.1 C)  99.2 F (37.3 C)  TempSrc: Oral Oral  Oral  Resp: 22 24  22   Height:      Weight:  69.99 kg (154 lb 4.8 oz)    SpO2: 94% 96% 99% 99%    Intake/Output Summary (Last 24 hours) at 12/30/15 1724 Last data filed at 12/30/15 0845  Gross per 24 hour  Intake    360 ml  Output    800 ml  Net   -440 ml   Filed Weights   12/22/15 1729 12/29/15 0420 12/30/15 0446  Weight: 74.39 kg (164 lb)  71.26 kg (157 lb 1.6 oz) 69.99 kg (154 lb 4.8 oz)    Examination:  General exam: Appears comfortable , on 2 lit of [place] oxygen.  Respiratory system: bibasilar rales. No wheezing or rhonchi.  Cardiovascular system: S1 & S2 heard, Gastrointestinal system: Abdomen is nondistended, soft and nontender. No organomegaly or masses felt. Normal bowel sounds heard. Central nervous system: Alert and appears to be oriented.  Skin: No rashes, lesions or ulcers     Data  Reviewed: I have personally reviewed following labs and imaging studies  CBC:  Recent Labs Lab 12/25/15 0355 12/26/15 0358 12/27/15 0440 12/28/15 0738 12/29/15 0420  WBC 11.5* 12.2* 11.5* 13.3* 12.7*  NEUTROABS  --  9.3*  --   --   --   HGB 12.4* 11.3* 11.6* 11.7* 12.0*  HCT 38.6* 34.3* 36.1* 35.5* 36.9*  MCV 91.0 90.0 89.8 89.4 91.1  PLT 315 317 339 359 [id number]   Basic Metabolic Panel:  Recent Labs Lab 12/24/15 0308 12/25/15 0355 12/26/15 0358 12/27/15 0440 12/29/15 0420  NA 133* 134* 129* 129* 130*  K 4.2 3.9 3.9 4.0 4.4  CL 101 100* 95* 96* 95*  CO2 23 25 25 25 27   GLUCOSE 112* 124* 242* 185* 113*  BUN 18 18 26* 32* 32*  CREATININE 1.19 1.46* 1.65* 1.84* 1.60*  CALCIUM 8.9 9.4 9.1 9.2 9.7  MG  --  1.4* 1.7  --   --    GFR: Estimated Creatinine Clearance: 38 mL/min (by C-G formula based on Cr of 1.6). Liver Function Tests:  Recent Labs Lab 12/30/15 0547  ALBUMIN 2.1*   No results for input(s): LIPASE, AMYLASE in the last 168 hours. No results for input(s): AMMONIA in the last 168 hours. Coagulation Profile:  Recent Labs Lab 12/26/15 0358 12/27/15 0440 12/28/15 0849 12/29/15 0420 12/30/15 0547  INR 1.89* 2.37* 2.29* 2.16* 2.45*   Cardiac Enzymes: No results for input(s): CKTOTAL, CKMB, CKMBINDEX, TROPONINI in the last 168 hours. BNP (last 3 results) No results for input(s): PROBNP in the last 8760 hours. HbA1C: No results for input(s): HGBA1C in the last 72 hours. CBG:  Recent Labs Lab 12/29/15 1114 12/29/15 1604 12/29/15 2139 12/30/15 0610 12/30/15 1153  GLUCAP 194* 204* 104* 104* 167*   Lipid Profile: No results for input(s): CHOL, HDL, LDLCALC, TRIG, CHOLHDL, LDLDIRECT in the last 72 hours. Thyroid Function Tests: No results for input(s): TSH, T4TOTAL, FREET4, T3FREE, THYROIDAB in the last 72 hours. Anemia Panel: No results for input(s): VITAMINB12, FOLATE, FERRITIN, TIBC, IRON, RETICCTPCT in the last 72 hours. Sepsis Labs:  Recent  Labs Lab 12/26/15 1356 12/28/15 0738 12/30/15 0547  PROCALCITON 0.43 0.46 0.38    Recent Results (from the past 240 hour(s))  Blood culture (routine x 2)     Status: None   Collection Time: 12/22/15 10:55 PM  Result Value Ref Range Status   Specimen Description BLOOD RIGHT ANTECUBITAL  Final   Special Requests BOTTLES DRAWN AEROBIC AND ANAEROBIC 5CC  Final   Culture NO GROWTH 5 DAYS  Final   Report Status 12/28/2015 FINAL  Final  Blood culture (routine x 2)     Status: None   Collection Time: 12/22/15 11:00 PM  Result Value Ref Range Status   Specimen Description BLOOD RIGHT HAND  Final   Special Requests IN PEDIATRIC BOTTLE [facility]  Final   Culture NO GROWTH 5 DAYS  Final   Report Status 12/28/2015 FINAL  Final  MRSA PCR Screening     Status: None  Collection Time: 12/23/15  7:25 AM  Result Value Ref Range Status   MRSA by PCR NEGATIVE NEGATIVE Final    Comment:        The GeneXpert MRSA Assay (FDA approved for NASAL specimens only), is one component of a comprehensive MRSA colonization surveillance program. It is not intended to diagnose MRSA infection nor to guide or monitor treatment for MRSA infections.   Culture, sputum-assessment     Status: None   Collection Time: 12/23/15  5:15 PM  Result Value Ref Range Status   Specimen Description SPUTUM  Final   Special Requests NONE  Final   Sputum evaluation   Final    MICROSCOPIC FINDINGS SUGGEST THAT THIS SPECIMEN IS NOT REPRESENTATIVE OF LOWER RESPIRATORY SECRETIONS. PLEASE RECOLLECT. Deloris Ping RN [id number] 12/23/15 A BROWNING    Report Status 12/23/2015 FINAL  Final  Gram stain     Status: None   Collection Time: 12/24/15 11:25 PM  Result Value Ref Range Status   Specimen Description EXPECTORATED SPUTUM  Final   Special Requests NONE  Final   Gram Stain   Final    MODERATE WBC PRESENT,BOTH PMN AND MONONUCLEAR RARE SQUAMOUS EPITHELIAL CELLS PRESENT FEW GRAM POSITIVE COCCI FEW GRAM VARIABLE ROD    Report  Status 12/25/2015 FINAL  Final  Respiratory Panel by PCR     Status: None   Collection Time: 12/25/15  9:45 AM  Result Value Ref Range Status   Adenovirus NOT DETECTED NOT DETECTED Final   Coronavirus 229E NOT DETECTED NOT DETECTED Final   Coronavirus HKU1 NOT DETECTED NOT DETECTED Final   Coronavirus NL63 NOT DETECTED NOT DETECTED Final   Coronavirus OC43 NOT DETECTED NOT DETECTED Final   Metapneumovirus NOT DETECTED NOT DETECTED Final   Rhinovirus / Enterovirus NOT DETECTED NOT DETECTED Final   Influenza A NOT DETECTED NOT DETECTED Final   Influenza A H1 NOT DETECTED NOT DETECTED Final   Influenza A H1 2009 NOT DETECTED NOT DETECTED Final   Influenza A H3 NOT DETECTED NOT DETECTED Final   Influenza B NOT DETECTED NOT DETECTED Final   Parainfluenza Virus 1 NOT DETECTED NOT DETECTED Final   Parainfluenza Virus 2 NOT DETECTED NOT DETECTED Final   Parainfluenza Virus 3 NOT DETECTED NOT DETECTED Final   Parainfluenza Virus 4 NOT DETECTED NOT DETECTED Final   Respiratory Syncytial Virus NOT DETECTED NOT DETECTED Final   Bordetella pertussis NOT DETECTED NOT DETECTED Final   Chlamydophila pneumoniae NOT DETECTED NOT DETECTED Final   Mycoplasma pneumoniae NOT DETECTED NOT DETECTED Final         Radiology Studies: Dg Chest Port 1 View  12/29/2015  CLINICAL DATA:  Hypoxia with congestion for 1 day EXAM: PORTABLE CHEST 1 VIEW COMPARISON:  Chest radiographs December 28, 2015 and Oct 11, 2015 ; chest CT December 25, 2015 FINDINGS: There is diffuse interstitial and patchy alveolar opacities, likely pulmonary edema. There is focal consolidation in the left lower lobe, stable from 1 day prior. No new opacity is evident. Heart size is upper normal. There is pulmonary venous hypertension. There is evidence of hilar adenopathy bilaterally, stable. No pneumothorax. There is postoperative change in the lower thoracic and visualized upper lumbar spine. IMPRESSION: Findings felt to be most consistent with  congestive heart failure, stable from 1 day prior. Suspect superimposed pneumonia left lower lobe. There may be a degree of underlying ARDS as well. More than one of these entities may exist concurrently. Stable cardiac silhouette. Evidence of hilar adenopathy bilaterally,  better seen on recent CT examination. Concern for underlying neoplasm. Electronically Signed   By: Lowella Grip III M.D.   On: 12/29/2015 08:59        Scheduled Meds: . calcium-vitamin D  1 tablet Oral Q breakfast  . dextromethorphan-guaiFENesin  2 tablet Oral BID  . diltiazem  240 mg Oral Daily  . docusate sodium  100 mg Oral BID  . ferrous sulfate  325 mg Oral TID WC  . insulin aspart  0-15 Units Subcutaneous TID WC  . insulin aspart  0-5 Units Subcutaneous QHS  . insulin aspart  3 Units Subcutaneous TID WC  . insulin detemir  15 Units Subcutaneous Daily  . levofloxacin  750 mg Oral Q48H  . pantoprazole  40 mg Oral Daily  . pravastatin  20 mg Oral QPM  . predniSONE  5 mg Oral Q breakfast  . thiamine  100 mg Oral Daily  . warfarin  2.5 mg Oral ONCE-1800  . Warfarin - Pharmacist Dosing Inpatient   Does not apply q1800   Continuous Infusions:    LOS: 7 days    Time spent: 30 minutes    Masen Luallen, MD [facility] Pager [phone]  If 7PM-7AM, please contact night-coverage [email or web address] Password [facility] 12/30/2015, 5:24 PM

## 2015-12-30 NOTE — Progress Notes (Signed)
ANTICOAGULATION CONSULT NOTE - Follow Up Consult  Pharmacy Consult:  Coumadin Indication: atrial fibrillation  Allergies  Allergen Reactions  . Ace Inhibitors Other (See Comments)    Causes high K   . Angiotensin Receptor Blockers Other (See Comments)    Causes high K   . Metoprolol Other (See Comments)    May cause elevated K level   . Penicillins Swelling and Rash    Has patient had a PCN reaction causing immediate rash, facial/tongue/throat swelling, SOB or lightheadedness with hypotension: YES Has patient had a PCN reaction causing severe rash involving mucus membranes or skin necrosis: NO Has patient had a PCN reaction that required hospitalization NO Has patient had a PCN reaction occurring within the last 10 years: NO If all of the above answers are "NO", then may proceed with Cephalosporin use. Pt has tolerated cephalosporins in the past   . Amiodarone Other (See Comments)    12/27/15: Cardiologist discontinued the amiodarone (on PTA) due to  concern for toxicity  (in hospitial with acute hypoxemic respiratory failure)   . Tetracycline Swelling and Rash    Patient Measurements: Height: 5\' 8"  (172.7 cm) Weight: 154 lb 4.8 oz (69.99 kg) (scale b) IBW/kg (Calculated) : 68.4  Vital Signs: Temp: 98.8 F (37.1 C) (07/20 0446) Temp Source: Oral (07/20 0446) BP: 116/54 mmHg (07/20 0446) Pulse Rate: 91 (07/20 0446)  Labs:  Recent Labs  12/28/15 0738 12/28/15 0849 12/29/15 0420 12/30/15 0547  HGB 11.7*  --  12.0*  --   HCT 35.5*  --  36.9*  --   PLT 359  --  350  --   LABPROT  --  25.0* 23.9* 26.3*  INR  --  2.29* 2.16* 2.45*  CREATININE  --   --  1.60*  --     Estimated Creatinine Clearance: 38 mL/min (by C-G formula based on Cr of 1.6).  Assessment: 14 YOM continues on Coumadin from PTA for history of AFib.  INR dipped below therapeutic range 7/15 and Lovenox bridge added given recent ablation, d/c'd 7/17 with therapeutic INR. INR stable therapeutic today  2.45. H/H 12.0/36.9, Plts 350, No signs of bleeding.  Home dose: 2.5mg  daily except 5mg  MWF  INR History: 7/20: 2.45  7/19: 2.16 7/18: 2.29 7/17: 2.37  Goal of Therapy:  INR 2-3 Anti-Xa level 0.6-1 units/ml 4hrs after LMWH dose given   Plan:  - Coumadin 2.5mg  PO x 1 dose tonight (home dose) - Daily PT / INR, potential for interactions with levofloxacin and amiodarone d/c - Monitor s/sx bleeding  Myer Peer Grayland Ormond), PharmD  PGY1 Pharmacy Resident Pager: [phone] 12/30/2015 11:45 AM

## 2015-12-30 NOTE — Progress Notes (Signed)
Occupational Therapy Treatment Patient Details Name: Curtis Frazier MRN: [id number] DOB: [birth year] Today's Date: 12/30/2015    History of present illness Pt adm with HCAP. PMH - CAD, DM, MI, CHF, GI bleed, HTN, afib   OT comments  Pt did well with theraband and seemed to enjoy  Follow Up Recommendations  Supervision - Intermittent;Home health OT    Equipment Recommendations  None recommended by OT    Recommendations for Other Services      Precautions / Restrictions Precautions Precautions: Fall;Other (comment) Precaution Comments: monitor O2 Restrictions Weight Bearing Restrictions: No       Mobility Bed Mobility   Bed Mobility: Supine to Sit     Supine to sit: Min assist        Transfers                                          Cognition   Behavior During Therapy: [facility] for tasks assessed/performed Overall Cognitive Status: Impaired/Different from baseline                         Exercises General Exercises - Upper Extremity Shoulder Flexion: AROM;Theraband;Both;20 reps Theraband Level (Shoulder Flexion): Level 1 (Yellow) Shoulder Horizontal ADduction: AROM;20 reps Elbow Flexion: AROM;Theraband;Both;20 reps Theraband Level (Elbow Flexion): Level 1 (Yellow) Elbow Extension: AROM;Both;20 reps;Theraband Theraband Level (Elbow Extension): Level 1 (Yellow)           Pertinent Vitals/ Pain       Pain Assessment: No/denies pain     Prior Functioning/Environment              Frequency Min 2X/week     Progress Toward Goals  OT Goals(current goals can now be found in the care plan section)  Progress towards OT goals: Progressing toward goals     Plan Discharge plan remains appropriate    Co-evaluation                 End of Session Equipment Utilized During Treatment: Oxygen   Activity Tolerance Patient tolerated treatment well   Patient Left in bed;with call bell/phone within reach;with bed alarm set    Nurse Communication Mobility status        Time: [id number] OT Time Calculation (min): 9 min  Charges: OT General Charges $OT Visit: 1 Procedure OT Treatments $Therapeutic Exercise: 8-22 mins  Carlon Chaloux, Edwena Felty D 12/30/2015, 2:02 PM

## 2015-12-31 ENCOUNTER — Ambulatory Visit: Payer: Self-pay | Admitting: *Deleted

## 2015-12-31 DIAGNOSIS — Z7189 Other specified counseling: Secondary | ICD-10-CM | POA: Insufficient documentation

## 2015-12-31 DIAGNOSIS — Z515 Encounter for palliative care: Secondary | ICD-10-CM | POA: Insufficient documentation

## 2015-12-31 DIAGNOSIS — Z789 Other specified health status: Secondary | ICD-10-CM

## 2015-12-31 LAB — GLUCOSE, CAPILLARY
Glucose-Capillary: 177 mg/dL — ABNORMAL HIGH (ref 65–99)
Glucose-Capillary: 153 mg/dL — ABNORMAL HIGH (ref 65–99)
Glucose-Capillary: 162 mg/dL — ABNORMAL HIGH (ref 65–99)
Glucose-Capillary: 105 mg/dL — ABNORMAL HIGH (ref 65–99)

## 2015-12-31 LAB — PROTIME-INR
INR: 2.57 — ABNORMAL HIGH (ref 0.00–1.49)
PROTHROMBIN TIME: 27.3 s — AB (ref 11.6–15.2)

## 2015-12-31 MED ORDER — WARFARIN SODIUM 5 MG PO TABS
5.0000 mg | ORAL_TABLET | Freq: Once | ORAL | Status: AC
  Administered 2015-12-31: 5 mg via ORAL
  Filled 2015-12-31: qty 1

## 2015-12-31 NOTE — Progress Notes (Signed)
Physical Therapy Treatment Patient Details Name: Curtis Frazier MRN: [id number] DOB: 07-Sep-1940 Today's Date: 12/31/2015    History of Present Illness Pt adm with HCAP. PMH - CAD, DM, MI, CHF, GI bleed, HTN, afib    PT Comments    Curtis Frazier continues to demonstrate functional decline over the past week and recommending SNF at d/c.  CM made aware.  He currently requires min assist for transfers and short distance ambulation due to unsteadiness.  Pt will benefit from continued skilled PT services to increase functional independence and safety.   Follow Up Recommendations  Other (comment);SNF;Supervision/Assistance - 24 hour (Cardiopulmonary Rehab)     Equipment Recommendations  None recommended by PT    Recommendations for Other Services       Precautions / Restrictions Precautions Precautions: Fall;Other (comment) Precaution Comments: monitor O2 Restrictions Weight Bearing Restrictions: No    Mobility  Bed Mobility               General bed mobility comments: Pt sitting EOB with RN upon PT arrival  Transfers Overall transfer level: Needs assistance Equipment used: Rolling walker (2 wheeled) Transfers: Sit to/from Stand Sit to Stand: Min assist         General transfer comment: Min assist to steady due to posterior bias and cues for hand placement.  Pt braces LEs against side of bed with standing.  Ambulation/Gait Ambulation/Gait assistance: Min assist Ambulation Distance (Feet): 40 Feet Assistive device: Rolling walker (2 wheeled) Gait Pattern/deviations: Step-through pattern;Decreased stride length;Trunk flexed;Staggering left;Staggering right Gait velocity: decreased   General Gait Details: Cues for upright posture and constant cues for pursed lip breathing.  Pt fatigues quickly and requires min assist at times due to unsteady gait.     Stairs            Wheelchair Mobility    Modified Rankin (Stroke Patients Only)       Balance Overall  balance assessment: Needs assistance Sitting-balance support: Single extremity supported;Feet supported Sitting balance-Leahy Scale: Poor Sitting balance - Comments: Must have at least 1 UE supported while sitting EOB Postural control: Posterior lean Standing balance support: Bilateral upper extremity supported;During functional activity Standing balance-Leahy Scale: Poor Standing balance comment: Relies on Bil UE support                     Cognition Arousal/Alertness: Awake/alert Behavior During Therapy: WFL for tasks assessed/performed Overall Cognitive Status: Impaired/Different from baseline Area of Impairment: Problem solving             Problem Solving: Slow processing      Exercises General Exercises - Lower Extremity Ankle Circles/Pumps: AROM;Both;10 reps;Seated Long Arc Quad: AROM;Both;5 reps;Seated Other Exercises Other Exercises: Encouraged pt to continue practicing pursed lip breathing technique    General Comments General comments (skin integrity, edema, etc.): SpO2 down to 86% on 2L O2 with stand pivot transfer.  Unfortunately, poor reading on pulse ox while ambulating in room but pt SOB.  Pt continues to demonstrate functional decline over the past week and recommending SNF.  CM made aware.      Pertinent Vitals/Pain Pain Assessment: Faces Faces Pain Scale: Hurts little more Pain Location: Lt hip Pain Descriptors / Indicators: Discomfort;Grimacing Pain Intervention(s): Limited activity within patient's tolerance;Monitored during session;Repositioned    Home Living                      Prior Function  PT Goals (current goals can now be found in the care plan section) Acute Rehab PT Goals Patient Stated Goal: to go home PT Goal Formulation: With patient Time For Goal Achievement: 12/30/15 Potential to Achieve Goals: Good Progress towards PT goals: Not progressing toward goals - comment (due to fatigue)    Frequency  Min  3X/week    PT Plan Discharge plan needs to be updated    Co-evaluation             End of Session Equipment Utilized During Treatment: Gait belt;Oxygen Activity Tolerance: Patient limited by fatigue Patient left: with call bell/phone within reach;in chair;with chair alarm set     Time: [id number] PT Time Calculation (min) (ACUTE ONLY): 27 min  Charges:  $Gait Training: 8-22 mins $Therapeutic Activity: 8-22 mins                    G Codes:       Curtis Frazier PT, DPT  Pager: [phone] Phone: [phone] 12/31/2015, 1:48 PM

## 2015-12-31 NOTE — Progress Notes (Signed)
Daily Progress Note   Patient Name: Curtis Frazier       Date: 12/30/2015 DOB: [birth year]  Age: 75 y.o. MRN#: [id number] Attending Physician: Hosie Poisson, MD Primary Care Physician: Loura Pardon, MD Admit Date: 12/22/2015  Reason for Consultation/Follow-up: Establishing goals of care  Subjective: Met today with [name] and his daughter, [name].  He reports feeling "pretty well" and was eating dinner at time of encounter.    We discussed plan moving forward, and his daughter reports that his family agrees that he needs to go for rehab, but ultimate goal is to return to his home.  We discussed plan to pursue placement in SNF for rehab as they have previously been arranging. He has done well with rehabbing in the past. If he does well and continues to thrive, I encouraged they continue with this plan. If, however he is unable to regain function and he continues to decline, I recommended palliative follow patient at SNF to help determine if he may be better served by focusing his care on transitioning home with support of organization such as hospice.  Length of Stay: 8  Current Medications: Scheduled Meds:  . calcium-vitamin D  1 tablet Oral Q breakfast  . dextromethorphan-guaiFENesin  2 tablet Oral BID  . diltiazem  240 mg Oral Daily  . docusate sodium  100 mg Oral BID  . ferrous sulfate  325 mg Oral TID WC  . insulin aspart  0-15 Units Subcutaneous TID WC  . insulin aspart  0-5 Units Subcutaneous QHS  . insulin aspart  3 Units Subcutaneous TID WC  . insulin detemir  15 Units Subcutaneous Daily  . pantoprazole  40 mg Oral Daily  . pravastatin  20 mg Oral QPM  . predniSONE  5 mg Oral Q breakfast  . thiamine  100 mg Oral Daily  . Warfarin - Pharmacist Dosing Inpatient   Does not apply  q1800    Continuous Infusions:    PRN Meds: acetaminophen, albuterol, ondansetron (ZOFRAN) IV  Physical Exam   General: Alert, awake, in no acute distress.  HEENT: No bruits, no goiter, no JVD Heart: Regular rate and rhythm. No murmur appreciated. Lungs: fair air movement, rales in bases Abdomen: Soft, nontender, nondistended, positive bowel sounds.  Skin: Warm and dry Neuro: Grossly intact, nonfocal.  Vital Signs: BP 107/52 mmHg  Pulse 79  Temp(Src) 98.3 F (36.8 C) (Oral)  Resp 18  Ht 5' 8"  (1.727 m)  Wt 69.219 kg (152 lb 9.6 oz)  BMI 23.21 kg/m2  SpO2 88% SpO2: SpO2: (!) 88 % O2 Device: O2 Device: Nasal Cannula O2 Flow Rate: O2 Flow Rate (L/min): 2 L/min  Intake/output summary:   Intake/Output Summary (Last 24 hours) at 12/31/15 2244 Last data filed at 12/31/15 2055  Gross per 24 hour  Intake   1080 ml  Output   1325 ml  Net   -245 ml   LBM: Last BM Date: 12/31/15 Baseline Weight: Weight: 74.39 kg (164 lb) Most recent weight: Weight: 69.219 kg (152 lb 9.6 oz) (scale b)       Palliative Assessment/Data: 50%    Flowsheet Rows        Most Recent Value   Intake Tab    Referral Department  Hospitalist   Unit at Time of Referral  Cardiac/Telemetry Unit   Palliative Care Primary Diagnosis  Cardiac   Date Notified  12/29/15   Palliative Care Type  Return patient Palliative Care   Reason for referral  Clarify Goals of Care   Date of Admission  12/22/15   Date first seen by Palliative Care  12/29/15   # of days IP prior to Palliative referral  7   Clinical Assessment    Palliative Performance Scale Score  50%   Psychosocial & Spiritual Assessment    Palliative Care Outcomes       Patient Active Problem List   Diagnosis Date Noted  . Complex care coordination   . Palliative care by specialist   . Advance care planning   . Acute respiratory failure with hypoxia ([place]) 12/25/2015  . HCAP (healthcare-associated pneumonia) 12/23/2015  .  Hyponatremia 12/23/2015  . GERD (gastroesophageal reflux disease) 12/23/2015  . Healthcare-associated pneumonia   . Hypoxia 12/22/2015  . Typical atrial flutter ([place])   . Productive cough 11/10/2015  . Hypertensive heart disease   . CKD (chronic kidney disease), stage III   . Moderate aortic stenosis   . Chronic diastolic CHF (congestive heart failure) ([place])   . CAD (coronary artery disease)   . Hyperlipidemia   . Persistent atrial fibrillation ([place])   . Fever 10/11/2015  . Dystrophic nail 10/08/2015  . Abnormal finding on thyroid function test 09/24/2015  . Acute on chronic diastolic heart failure ([place]) 09/24/2015  . Injury of kidney 09/24/2015  . At risk for infection 09/24/2015  . Type 2 diabetes mellitus ([place]) 09/24/2015  . Pain in the chest   . Acute on chronic diastolic HF (heart failure) ([place])   . Atrial fibrillation with RVR ([place])   . Atrial flutter ([place])   . Chest pain on exertion 09/09/2015  . Elevated troponin 09/09/2015  . Kidney disease, chronic, stage III (GFR 30-59 ml/min) 09/09/2015  . Anemia of chronic disease 09/09/2015  . Anemia in chronic illness 09/09/2015  . Chest pain 09/09/2015  . Chronic kidney disease (CKD), stage III (moderate) 09/09/2015  . Acute posthemorrhagic anemia   . Abnormal thyroid function test   . Arterial hypotension   . Paroxysmal atrial fibrillation (HCC)   . Diabetes mellitus with complication ([place])   . Symptomatic cholelithiasis 09/07/2015  . Calculus of gallbladder 09/07/2015  . Spondylosis of lumbar region without myelopathy or radiculopathy   . Urinary retention 08/12/2015  . Bladder retention 08/12/2015  . Chronic diastolic congestive heart failure ([place]) 08/10/2015  .  Chronic diastolic heart failure ([place]) 08/10/2015  . Back pain without radiation   . Counseling regarding advanced care planning and goals of care   . S/P lumbar laminectomy 07/17/2015  . Postprocedural state 07/17/2015  . Back pain, thoracic 01/15/2015  .  Chest wall pain 01/15/2015  . Encounter for Medicare annual wellness exam 07/07/2013  . BPH (benign prostatic hyperplasia) 07/05/2012  . Enlarged prostate 07/05/2012  . Prostate cancer screening 06/27/2012  . Low TSH level 01/22/2012  . PMR (polymyalgia rheumatica) (HCC) 01/20/2012  . Polymyalgia rheumatica ([place]) 01/20/2012  . HYPERKALEMIA 02/18/2010  . ANGIODYSPLASIA-INTESTINE 06/10/2009  . Angiodysplasia of colon 06/10/2009  . MURMUR 05/21/2009  . Left carotid bruit 05/21/2009  . Cardiac murmur 05/21/2009  . Coronary atherosclerosis 03/09/2009  . CAD in native artery 03/09/2009  . Rheumatoid arthritis ([place]) 04/29/2008  . HX, PERSONAL, COLONIC POLYPS 01/17/2007  . History of colon polyps 01/17/2007  . PSORIATIC ARTHRITIS 09/18/2006  . PSORIASIS 09/18/2006  . 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 DEGENERATIVE DISC DISEASE 09/18/2006  . SCOLIOSIS 09/18/2006  . TOBACCO ABUSE, HX OF 09/18/2006  . 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 Narrowing of intervertebral disc space 09/18/2006  . Idiopathic scoliosis 09/18/2006  . Nicotine addiction 09/18/2006    Palliative Care Assessment & Plan   Patient Profile: 75 y.o. male with past medical history of AFib, CAD, DM, MI, CHF (compensated, EF 55-60%), HTN, CKD III admitted on 12/22/2015 with c/o cough, pleuritic pain and SOB. He was found to have health care associated pneumonia. Admitted for treatment. Consult to Palliative Medicine Team was requested for clarification of goals of care.  Mr. Strader has had several hospital admissions in the past 6 months. He had a lumbar back surgery at the beginning of the year, followed by pneumonia/sepsis in February that resulted in respiratory failure and bounced back and forth between CIR and acute care due to ongoing cardiac and respiratory problems. He eventually was discharged home with home health in April, 2017. In late April he had another admission to [facility] with acute kidney  injury and leucocytosis. He is now admitted with HCAP.  Assessment/Plan: - Family invested in plan to pursue placement for rehab.  I recommend palliative follow him at [facility]. -I recommended completion of a MOST prior to discharge.  I have offered to follow up with family tomorrow to discuss if they would like to complete prior to discharge.  Goals of Care and Additional Recommendations:  Limitations on Scope of Treatment: No Artificial Feeding  Code Status:    Code Status Orders        Start     Ordered   12/23/15 0046  Full code   Continuous     12/23/15 0047    Code Status History    Date Active Date Inactive Code Status Order ID Comments User Context   12/15/2015 10:55 AM 12/16/2015 12:54 PM Full Code [id number]  Will Meredith Leeds, MD Inpatient   09/08/2015  3:01 AM 09/16/2015  6:30 PM Full Code [id number]  Samella Parr, NP ED   09/06/2015  3:42 AM 09/06/2015  9:36 PM Full Code [id number]  Ivor Costa, MD ED   08/17/2015  5:38 PM 09/02/2015  3:26 PM Full Code [id number]  Bary Leriche, PA-C Inpatient   08/10/2015  4:54 PM 08/17/2015  5:38 PM Full Code [id number]  Radene Gunning, NP Inpatient   08/03/2015  5:33 PM 08/10/2015  4:54 PM Full Code [id number]  Carney Harder Inpatient  07/17/2015 10:57 PM 08/03/2015  5:33 PM Full Code [id number]  Theressa Millard, MD Inpatient       Prognosis:   < 6 months due to several chronic illnesses and frequent hospitalizations in the last six months. Significant decline noted in functional, nutritional, and cognitive status.   Discharge Planning:  SNF with palliative follow-up.  Eventual goal is to transition home (possibly with support of hospice)  Care plan was discussed with family and patient.   Thank you for allowing the Palliative Medicine Team to assist in the care of this patient.   Time In: 1620 Time Out: 1645 Total Time 25 mins Prolonged Time Billed  No      Greater than 50%  of this time was spent counseling and coordinating care related to the  above assessment and plan.   Micheline Rough, MD  Please contact Palliative Medicine Team phone at [phone] for questions and concerns.

## 2015-12-31 NOTE — Care Management Note (Addendum)
Case Management Note  Patient Details  Name: LONNEL JARED MRN: [id number] Date of Birth: Feb 01, 1941  Subjective/Objective:        CM following for progression and d/c planning.             Action/Plan:  12/31/2015 Noted that pt is active with [facility] services, CM notified that pt will need oxygen at home, however no qualifying sats or orders at this point. CM will continue to follow. Noted possible Pall services for home. Will discuss with family re choice for these services. Unclear if family is ready to move to Hospice home care services. Will await notes for today.  4:43 pm Spoke with son, Abagail Kitchens who is unclear on family decision. Spoke with Dr Karleen Hampshire who states that the plan at this time is for the pt to d/c to SNF. CSW, Dayton Scrape is working with the family. Given this plan ,no DME or [place] services are needed at this time.   Expected Discharge Date:                  Expected Discharge Plan:  [place]  In-House Referral:     Discharge planning Services  CM Consult  Post Acute Care Choice:  Resumption of Svcs/PTA Provider Choice offered to:     DME Arranged:    DME Agency:     HH Arranged:  PT, RN [place] Agency:  [facility] (now [facility])  Status of Service:  In process, will continue to follow  If discussed at Long Length of Stay Meetings, dates discussed:    Additional Comments:  Adron Bene, RN 12/31/2015, 11:19 AM

## 2015-12-31 NOTE — Progress Notes (Signed)
PROGRESS NOTE    Curtis Frazier  [id number] DOB: 21-Feb-1941 DOA: 12/22/2015 PCP: Loura Pardon, MD    Brief Narrative: Patient is a 54 year gentleman history of hyperlipidemia, diabetes, chronic kidney disease stage III, diastolic CHF, paroxysmal atrial fibrillation on Coumadin/atrial flutter status post recent CTI ablation per cardiology 12/15/2015 who presents to the ED with a 2 day history of cough, shortness of breath, chest pain pleuritic in nature with subjective fever and noted per chest x-ray to have a right middle and right lower lobe pneumonia. Completed total 9 days of antibiotics.  Plan for SNF when bed available.   Assessment & Plan:   Principal Problem:   Acute respiratory failure with hypoxia (HCC) Active Problems:   Coronary atherosclerosis   Rheumatoid arthritis (HCC)   PMR (polymyalgia rheumatica) (HCC)   Back pain without radiation   Chronic diastolic congestive heart failure (HCC)   Paroxysmal atrial fibrillation (HCC)   Diabetes mellitus with complication (HCC)   Kidney disease, chronic, stage III (GFR 30-59 ml/min)   Atrial flutter (HCC)   Acute on chronic diastolic HF (heart failure) (HCC)   Atrial fibrillation with RVR (HCC)   Hyperlipidemia   Hypoxia   HCAP (healthcare-associated pneumonia)   Hyponatremia   GERD (gastroesophageal reflux disease)   Healthcare-associated pneumonia   Complex care coordination   Palliative care by specialist   Advance care planning   Acute respiratory failure with hypoxia possibly from a combination of health care associated pneumonia and acute on chronic diastolic heart failure, vs amiodarone toxicity. Started on broad spectrum IV antibiotics and transitioned to levaquin to complete the course.  Weaned him down to 2 lit [place] oxygen on 7/19 and would remain on the same.  [place] oxygen to keep sats around 88 to 91%.  duonebs as needed.  CT chest negative for PE.  Meanwhile he is also being treated for acute onchronic diastolic  heart failure with IV lasix.  No further lasix as per cardiology, and amiodarone discontinued for now, .  Appreciate cardiology and pulmonary recommendations.  Outpatient follow up .  Completed the course of antibiotics.    Diabetes Mellitus: CBG (last 3)   Recent Labs  12/31/15 0639 12/31/15 1201 12/31/15 1630  GLUCAP 177* 153* 162*    Resume the same regimen.   Paroxysmal atrial fibrillation: in sinus, and on cardizem and coumadin for anticoagulation.  Rate controlled.     Acute encephalopathy possibly toxic encephalopathy from respiratory failure.  Resolved.   In view of his medical co morbidities,decoditioned stage, requested palliative care consult for goals of care. Currently awaiting recommendations regarding disposition.  Plan for SNF on discharge , currently awaiting bed .    DVT prophylaxis: coumadin Code Status: (Full/) Family Communication: discussed with son over the phone.  Disposition Plan: pending further eval.    Consultants:   Cardiology  Pulmonology  Palliative care.    Procedures: none   Antimicrobials:levaquin   Subjective: No new complaints.   Objective: Filed Vitals:   12/30/15 2013 12/31/15 0430 12/31/15 1333 12/31/15 1335  BP: 115/59 99/51 95/45  97/46  Pulse: 88 90 82   Temp: 99 F (37.2 C) 99.3 F (37.4 C) 98 F (36.7 C)   TempSrc: Oral Oral Oral   Resp: 20 20 20    Height:      Weight:  69.219 kg (152 lb 9.6 oz)    SpO2: 93% 97% 92%     Intake/Output Summary (Last 24 hours) at 12/31/15 1820 Last data filed at 12/31/15  1316  Gross per 24 hour  Intake    840 ml  Output    950 ml  Net   -110 ml   Filed Weights   12/29/15 0420 12/30/15 0446 12/31/15 0430  Weight: 71.26 kg (157 lb 1.6 oz) 69.99 kg (154 lb 4.8 oz) 69.219 kg (152 lb 9.6 oz)    Examination:  General exam: Appears comfortable , on 2 lit of  oxygen.  Respiratory system: bibasilar rales. No wheezing or rhonchi.  Cardiovascular system: S1 & S2  heard, Gastrointestinal system: Abdomen is nondistended, soft and nontender. No organomegaly or masses felt. Normal bowel sounds heard. Central nervous system: Alert and appears to be oriented.  Skin: No rashes, lesions or ulcers     Data Reviewed: I have personally reviewed following labs and imaging studies  CBC:  Recent Labs Lab 12/25/15 0355 12/26/15 0358 12/27/15 0440 12/28/15 0738 12/29/15 0420  WBC 11.5* 12.2* 11.5* 13.3* 12.7*  NEUTROABS  --  9.3*  --   --   --   HGB 12.4* 11.3* 11.6* 11.7* 12.0*  HCT 38.6* 34.3* 36.1* 35.5* 36.9*  MCV 91.0 90.0 89.8 89.4 91.1  PLT 315 317 339 359 [id number]   Basic Metabolic Panel:  Recent Labs Lab 12/25/15 0355 12/26/15 0358 12/27/15 0440 12/29/15 0420  NA 134* 129* 129* 130*  K 3.9 3.9 4.0 4.4  CL 100* 95* 96* 95*  CO2 25 25 25 27   GLUCOSE 124* 242* 185* 113*  BUN 18 26* 32* 32*  CREATININE 1.46* 1.65* 1.84* 1.60*  CALCIUM 9.4 9.1 9.2 9.7  MG 1.4* 1.7  --   --    GFR: Estimated Creatinine Clearance: 38 mL/min (by C-G formula based on Cr of 1.6). Liver Function Tests:  Recent Labs Lab 12/30/15 0547  ALBUMIN 2.1*   No results for input(s): LIPASE, AMYLASE in the last 168 hours. No results for input(s): AMMONIA in the last 168 hours. Coagulation Profile:  Recent Labs Lab 12/27/15 0440 12/28/15 0849 12/29/15 0420 12/30/15 0547 12/31/15 0401  INR 2.37* 2.29* 2.16* 2.45* 2.57*   Cardiac Enzymes: No results for input(s): CKTOTAL, CKMB, CKMBINDEX, TROPONINI in the last 168 hours. BNP (last 3 results) No results for input(s): PROBNP in the last 8760 hours. HbA1C: No results for input(s): HGBA1C in the last 72 hours. CBG:  Recent Labs Lab 12/30/15 1729 12/30/15 2148 12/31/15 0639 12/31/15 1201 12/31/15 1630  GLUCAP 125* 73 177* 153* 162*   Lipid Profile: No results for input(s): CHOL, HDL, LDLCALC, TRIG, CHOLHDL, LDLDIRECT in the last 72 hours. Thyroid Function Tests: No results for input(s): TSH,  T4TOTAL, FREET4, T3FREE, THYROIDAB in the last 72 hours. Anemia Panel: No results for input(s): VITAMINB12, FOLATE, FERRITIN, TIBC, IRON, RETICCTPCT in the last 72 hours. Sepsis Labs:  Recent Labs Lab 12/26/15 1356 12/28/15 0738 12/30/15 0547  PROCALCITON 0.43 0.46 0.38    Recent Results (from the past 240 hour(s))  Blood culture (routine x 2)     Status: None   Collection Time: 12/22/15 10:55 PM  Result Value Ref Range Status   Specimen Description BLOOD RIGHT ANTECUBITAL  Final   Special Requests BOTTLES DRAWN AEROBIC AND ANAEROBIC 5CC  Final   Culture NO GROWTH 5 DAYS  Final   Report Status 12/28/2015 FINAL  Final  Blood culture (routine x 2)     Status: None   Collection Time: 12/22/15 11:00 PM  Result Value Ref Range Status   Specimen Description BLOOD RIGHT HAND  Final   Special Requests  IN PEDIATRIC BOTTLE 2CC  Final   Culture NO GROWTH 5 DAYS  Final   Report Status 12/28/2015 FINAL  Final  MRSA PCR Screening     Status: None   Collection Time: 12/23/15  7:25 AM  Result Value Ref Range Status   MRSA by PCR NEGATIVE NEGATIVE Final    Comment:        The GeneXpert MRSA Assay (FDA approved for NASAL specimens only), is one component of a comprehensive MRSA colonization surveillance program. It is not intended to diagnose MRSA infection nor to guide or monitor treatment for MRSA infections.   Culture, sputum-assessment     Status: None   Collection Time: 12/23/15  5:15 PM  Result Value Ref Range Status   Specimen Description SPUTUM  Final   Special Requests NONE  Final   Sputum evaluation   Final    MICROSCOPIC FINDINGS SUGGEST THAT THIS SPECIMEN IS NOT REPRESENTATIVE OF LOWER RESPIRATORY SECRETIONS. PLEASE RECOLLECT. Deloris Ping RN [id number] 12/23/15 A BROWNING    Report Status 12/23/2015 FINAL  Final  Gram stain     Status: None   Collection Time: 12/24/15 11:25 PM  Result Value Ref Range Status   Specimen Description EXPECTORATED SPUTUM  Final    Special Requests NONE  Final   Gram Stain   Final    MODERATE WBC PRESENT,BOTH PMN AND MONONUCLEAR RARE SQUAMOUS EPITHELIAL CELLS PRESENT FEW GRAM POSITIVE COCCI FEW GRAM VARIABLE ROD    Report Status 12/25/2015 FINAL  Final  Respiratory Panel by PCR     Status: None   Collection Time: 12/25/15  9:45 AM  Result Value Ref Range Status   Adenovirus NOT DETECTED NOT DETECTED Final   Coronavirus 229E NOT DETECTED NOT DETECTED Final   Coronavirus HKU1 NOT DETECTED NOT DETECTED Final   Coronavirus NL63 NOT DETECTED NOT DETECTED Final   Coronavirus OC43 NOT DETECTED NOT DETECTED Final   Metapneumovirus NOT DETECTED NOT DETECTED Final   Rhinovirus / Enterovirus NOT DETECTED NOT DETECTED Final   Influenza A NOT DETECTED NOT DETECTED Final   Influenza A H1 NOT DETECTED NOT DETECTED Final   Influenza A H1 2009 NOT DETECTED NOT DETECTED Final   Influenza A H3 NOT DETECTED NOT DETECTED Final   Influenza B NOT DETECTED NOT DETECTED Final   Parainfluenza Virus 1 NOT DETECTED NOT DETECTED Final   Parainfluenza Virus 2 NOT DETECTED NOT DETECTED Final   Parainfluenza Virus 3 NOT DETECTED NOT DETECTED Final   Parainfluenza Virus 4 NOT DETECTED NOT DETECTED Final   Respiratory Syncytial Virus NOT DETECTED NOT DETECTED Final   Bordetella pertussis NOT DETECTED NOT DETECTED Final   Chlamydophila pneumoniae NOT DETECTED NOT DETECTED Final   Mycoplasma pneumoniae NOT DETECTED NOT DETECTED Final         Radiology Studies: No results found.      Scheduled Meds: . calcium-vitamin D  1 tablet Oral Q breakfast  . dextromethorphan-guaiFENesin  2 tablet Oral BID  . diltiazem  240 mg Oral Daily  . docusate sodium  100 mg Oral BID  . ferrous sulfate  325 mg Oral TID WC  . insulin aspart  0-15 Units Subcutaneous TID WC  . insulin aspart  0-5 Units Subcutaneous QHS  . insulin aspart  3 Units Subcutaneous TID WC  . insulin detemir  15 Units Subcutaneous Daily  . pantoprazole  40 mg Oral Daily  .  pravastatin  20 mg Oral QPM  . predniSONE  5 mg Oral Q breakfast  .  thiamine  100 mg Oral Daily  . Warfarin - Pharmacist Dosing Inpatient   Does not apply q1800   Continuous Infusions:    LOS: 8 days    Time spent: 30 minutes    Alfredo Collymore, MD [facility] Pager [id number]  If 7PM-7AM, please contact night-coverage [email or web address] Password [email or web address] 12/31/2015, 6:20 PM

## 2015-12-31 NOTE — Progress Notes (Addendum)
Daily Progress Note   Patient Name: Curtis Frazier       Date: 12/30/2015 DOB: 21-Nov-1940  Age: 75 y.o. MRN#: [id number] Attending Physician: Hosie Poisson, MD Primary Care Physician: Loura Pardon, MD Admit Date: 12/22/2015  Reason for Consultation/Follow-up: Establishing goals of care  Subjective: Met with patient and family regarding goals of care, patient prognosis and trajectory. Family has noticed significant decline in past six months in functional status, cognitive changes, and nutritional status.Prior to October he was at home, independent, driving his favorite tractor. Now he requires some assistance with ADL's, has noted memory loss, and has lost about 30lbs. Family and patient's goals are to return home and return to full functional status. Discussed that patient's trajectory indicates that we may be in a different place and facilitated family in reframing goals towards quality of life for patient. Discussed Code status and possible outcomes from CPR and intubation. Patient's son Ruby Cola is and EMT and is aware of the reality of CPR. He notes that if patient had CPR it would likely be upsetting to patient's family. Patient states he wants "everything" done "if it will help". However, he does not want to be on a ventilator long term, have a feeding tube, or to reside in a nursing facility.  I  introduced concept of palliative care and Hospice care and services that could be provided at home to assist with quality of life. Gave them Hard Choices book, Adv Directive paperwork (daughter [name] states they want to put patient's wishes in writing).   Length of Stay: 8  Current Medications: Scheduled Meds:  . calcium-vitamin D  1 tablet Oral Q breakfast  . dextromethorphan-guaiFENesin  2 tablet Oral  BID  . diltiazem  240 mg Oral Daily  . docusate sodium  100 mg Oral BID  . ferrous sulfate  325 mg Oral TID WC  . insulin aspart  0-15 Units Subcutaneous TID WC  . insulin aspart  0-5 Units Subcutaneous QHS  . insulin aspart  3 Units Subcutaneous TID WC  . insulin detemir  15 Units Subcutaneous Daily  . levofloxacin  750 mg Oral Q48H  . pantoprazole  40 mg Oral Daily  . pravastatin  20 mg Oral QPM  . predniSONE  5 mg Oral Q breakfast  . thiamine  100 mg Oral Daily  .  Warfarin - Pharmacist Dosing Inpatient   Does not apply q1800    Continuous Infusions:    PRN Meds: acetaminophen, albuterol, ondansetron (ZOFRAN) IV  Physical Exam          Vital Signs: BP 99/51 mmHg  Pulse 90  Temp(Src) 99.3 F (37.4 C) (Oral)  Resp 20  Ht _0  (1.727 m)  Wt 69.219 kg (152 lb 9.6 oz)  BMI 23.21 kg/m2  SpO2 97% SpO2: SpO2: 97 % O2 Device: O2 Device: Nasal Cannula O2 Flow Rate: O2 Flow Rate (L/min): 2 L/min  Intake/output summary:  Intake/Output Summary (Last 24 hours) at 12/31/15 7989 Last data filed at 12/31/15 0430  Gross per 24 hour  Intake    600 ml  Output    350 ml  Net    250 ml   LBM: Last BM Date: 12/29/15 Baseline Weight: Weight: 74.39 kg (164 lb) Most recent weight: Weight: 69.219 kg (152 lb 9.6 oz) (scale b)       Palliative Assessment/Data: 50%    Flowsheet Rows        Most Recent Value   Intake Tab    Referral Department  Hospitalist   Unit at Time of Referral  Cardiac/Telemetry Unit   Palliative Care Primary Diagnosis  Cardiac   Date Notified  12/29/15   Palliative Care Type  Return patient Palliative Care   Reason for referral  Clarify Goals of Care   Date of Admission  12/22/15   Date first seen by Palliative Care  12/29/15   # of days IP prior to Palliative referral  7   Clinical Assessment    Palliative Performance Scale Score  50%   Psychosocial & Spiritual Assessment    Palliative Care Outcomes       Patient Active Problem List   Diagnosis  Date Noted  . Acute respiratory failure with hypoxia ([place]) 12/25/2015  . HCAP (healthcare-associated pneumonia) 12/23/2015  . Hyponatremia 12/23/2015  . GERD (gastroesophageal reflux disease) 12/23/2015  . Healthcare-associated pneumonia   . Hypoxia 12/22/2015  . Typical atrial flutter ([place])   . Productive cough 11/10/2015  . Hypertensive heart disease   . CKD (chronic kidney disease), stage III   . Moderate aortic stenosis   . Chronic diastolic CHF (congestive heart failure) ([place])   . CAD (coronary artery disease)   . Hyperlipidemia   . Persistent atrial fibrillation ([place])   . Fever 10/11/2015  . Dystrophic nail 10/08/2015  . Abnormal finding on thyroid function test 09/24/2015  . Acute on chronic diastolic heart failure ([place]) 09/24/2015  . Injury of kidney 09/24/2015  . At risk for infection 09/24/2015  . Type 2 diabetes mellitus ([place]) 09/24/2015  . Pain in the chest   . Acute on chronic diastolic HF (heart failure) ([place])   . Atrial fibrillation with RVR ([place])   . Atrial flutter ([place])   . Chest pain on exertion 09/09/2015  . Elevated troponin 09/09/2015  . Kidney disease, chronic, stage III (GFR 30-59 ml/min) 09/09/2015  . Anemia of chronic disease 09/09/2015  . Anemia in chronic illness 09/09/2015  . Chest pain 09/09/2015  . Chronic kidney disease (CKD), stage III (moderate) 09/09/2015  . Acute posthemorrhagic anemia   . Abnormal thyroid function test   . Arterial hypotension   . Paroxysmal atrial fibrillation (HCC)   . Diabetes mellitus with complication ([place])   . Symptomatic cholelithiasis 09/07/2015  . Calculus of gallbladder 09/07/2015  . Spondylosis of lumbar region without myelopathy or radiculopathy   .  Urinary retention 08/12/2015  . Bladder retention 08/12/2015  . Chronic diastolic congestive heart failure ([place]) 08/10/2015  . Chronic diastolic heart failure ([place]) 08/10/2015  . Back pain without radiation   . Counseling regarding advanced care planning and  goals of care   . S/P lumbar laminectomy 07/17/2015  . Postprocedural state 07/17/2015  . Back pain, thoracic 01/15/2015  . Chest wall pain 01/15/2015  . Encounter for Medicare annual wellness exam 07/07/2013  . BPH (benign prostatic hyperplasia) 07/05/2012  . Enlarged prostate 07/05/2012  . Prostate cancer screening 06/27/2012  . Low TSH level 01/22/2012  . PMR (polymyalgia rheumatica) (HCC) 01/20/2012  . Polymyalgia rheumatica ([place]) 01/20/2012  . HYPERKALEMIA 02/18/2010  . ANGIODYSPLASIA-INTESTINE 06/10/2009  . Angiodysplasia of colon 06/10/2009  . MURMUR 05/21/2009  . Left carotid bruit 05/21/2009  . Cardiac murmur 05/21/2009  . Coronary atherosclerosis 03/09/2009  . CAD in native artery 03/09/2009  . Rheumatoid arthritis ([place]) 04/29/2008  . HX, PERSONAL, COLONIC POLYPS 01/17/2007  . History of colon polyps 01/17/2007  . PSORIATIC ARTHRITIS 09/18/2006  . PSORIASIS 09/18/2006  . 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 DEGENERATIVE DISC DISEASE 09/18/2006  . SCOLIOSIS 09/18/2006  . TOBACCO ABUSE, HX OF 09/18/2006  . 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 Narrowing of intervertebral disc space 09/18/2006  . Idiopathic scoliosis 09/18/2006  . Nicotine addiction 09/18/2006    Palliative Care Assessment & Plan   Patient Profile: 75 y.o. male with past medical history of AFib, CAD, DM, MI, CHF (compensated, EF 55-60%), HTN, CKD III admitted on 12/22/2015 with c/o cough, pleuritic pain and SOB. He was found to have health care associated pneumonia. Admitted for treatment. Consult to Palliative Medicine Team was requested for clarification of goals of care.  Mr. Mcclenathan has had several hospital admissions in the past 6 months. He had a lumbar back surgery at the beginning of the year, followed by pneumonia/sepsis in February that resulted in respiratory failure and bounced back and forth between CIR and acute care due to ongoing cardiac and respiratory problems. He eventually was  discharged home with home health in April, 2017. In late April he had another admission to [facility] with acute kidney injury and leucocytosis. He is now admitted with HCAP.  Assessment/Plan: Pleasant patient and family. Absorbing idea of the fact that patient has poor prognosis r/t chronic illnesses d/t decline in functional, cognitive, and nutritional status in the last six months that does not appear to be improving.  -Remain full code for now, but recommend continuing conversation regarding Code status and supportive care with palliative vs. Hospice care at home after family has had time to discuss and absorb today's conversation. Palliative team will follow up with patient's daughter, [name] to facilitate this further.    Goals of Care and Additional Recommendations:  Limitations on Scope of Treatment: No Artificial Feeding  Code Status:    Code Status Orders        Start     Ordered   12/23/15 0046  Full code   Continuous     12/23/15 0047    Code Status History    Date Active Date Inactive Code Status Order ID Comments User Context   12/15/2015 10:55 AM 12/16/2015 12:54 PM Full Code [id number]  Will Meredith Leeds, MD Inpatient   09/08/2015  3:01 AM 09/16/2015  6:30 PM Full Code [id number]  Samella Parr, NP ED   09/06/2015  3:42 AM 09/06/2015  9:36 PM Full Code [id number]  Ivor Costa, MD ED  08/17/2015  5:38 PM 09/02/2015  3:26 PM Full Code [id number]  Bary Leriche, PA-C Inpatient   08/10/2015  4:54 PM 08/17/2015  5:38 PM Full Code [id number]  Radene Gunning, NP Inpatient   08/03/2015  5:33 PM 08/10/2015  4:54 PM Full Code [id number]  Carney Harder Inpatient   07/17/2015 10:57 PM 08/03/2015  5:33 PM Full Code [id number]  Theressa Millard, MD Inpatient       Prognosis:   < 6 months due to several chronic illnesses and frequent hospitalizations in the last six months. Significant decline noted in functional, nutritional, and cognitive status.   Discharge Planning:  Home with  Hospice vs. Palliative- family is processing information and may be receptive to home services with further discussion  Care plan was discussed with family and patient.   Thank you for allowing the Palliative Medicine Team to assist in the care of this patient.   Time In: 1600 Time Out: 1705 Total Time 65 mins Prolonged Time Billed  No      Greater than 50%  of this time was spent counseling and coordinating care related to the above assessment and plan.   Payton Emerald, NP  Please contact Palliative Medicine Team phone at [phone] for questions and concerns.

## 2015-12-31 NOTE — Progress Notes (Signed)
ANTICOAGULATION CONSULT NOTE - Follow Up Consult  Pharmacy Consult:  Coumadin Indication: atrial fibrillation  Allergies  Allergen Reactions  . Ace Inhibitors Other (See Comments)    Causes high K   . Angiotensin Receptor Blockers Other (See Comments)    Causes high K   . Metoprolol Other (See Comments)    May cause elevated K level   . Penicillins Swelling and Rash    Has patient had a PCN reaction causing immediate rash, facial/tongue/throat swelling, SOB or lightheadedness with hypotension: YES Has patient had a PCN reaction causing severe rash involving mucus membranes or skin necrosis: NO Has patient had a PCN reaction that required hospitalization NO Has patient had a PCN reaction occurring within the last 10 years: NO If all of the above answers are "NO", then may proceed with Cephalosporin use. Pt has tolerated cephalosporins in the past   . Amiodarone Other (See Comments)    12/27/15: Cardiologist discontinued the amiodarone (on PTA) due to  concern for toxicity  (in hospitial with acute hypoxemic respiratory failure)   . Tetracycline Swelling and Rash    Patient Measurements: Height: 5\' 8"  (172.7 cm) Weight: 152 lb 9.6 oz (69.219 kg) (scale b) IBW/kg (Calculated) : 68.4  Vital Signs: Temp: 98 F (36.7 C) (07/21 1333) Temp Source: Oral (07/21 1333) BP: 97/46 mmHg (07/21 1335) Pulse Rate: 82 (07/21 1333)  Labs:  Recent Labs  12/29/15 0420 12/30/15 0547 12/31/15 0401  HGB 12.0*  --   --   HCT 36.9*  --   --   PLT 350  --   --   LABPROT 23.9* 26.3* 27.3*  INR 2.16* 2.45* 2.57*  CREATININE 1.60*  --   --     Estimated Creatinine Clearance: 38 mL/min (by C-G formula based on Cr of 1.6).  Assessment: 66 YOM continues on Coumadin from PTA for history of AFib (PAF).  INR dipped below therapeutic range 7/15 and Lovenox bridge added given recent ablation. Lovenox d/c'd 7/17 with therapeutic INR. INR stable therapeutic today 2.57 on coumadin. -last CBC done  7/19:  H/H 12.0/36.9, Plts 350, No signs of bleeding.  Home dose: 2.5mg  daily except 5mg  MWF (INR was 2.29 on admit)  INR History: 7/21: 2.57 7/20: 2.45  7/19: 2.16 7/18: 2.29 7/17: 2.37  Goal of Therapy:  INR 2-3 Anti-Xa level 0.6-1 units/ml 4hrs after LMWH dose given   Plan:  - Coumadin 5mg  PO x 1 dose tonight (home dose) - Daily PT / INR, potential for interactions with levofloxacin and due to discontinuation of amiodarone that patient was on PTA. - Monitor s/sx bleeding   Nicole Cella, RPh Clinical Pharmacist Pager: [phone] 12/31/2015 2:30 PM

## 2015-12-31 NOTE — NC FL2 (Signed)
[place] LEVEL OF CARE SCREENING TOOL     IDENTIFICATION  Patient Name: Curtis Frazier Birthdate: 08/20/1940 Sex: male Admission Date (Current Location): 12/22/2015  [facility] and [place] Number:  Herbalist and Address:  [facility] [place] [address], [place], [place] [address]      Provider Number: [id number]  Attending Physician Name and Address:  Hosie Poisson, MD  Relative Name and Phone Number:       Current Level of Care: Hospital Recommended Level of Care: [place] Prior Approval Number:    Date Approved/Denied:   PASRR Number: [id number]  Discharge Plan: SNF    Current Diagnoses: Patient Active Problem List   Diagnosis Date Noted  . Complex care coordination   . Palliative care by specialist   . Advance care planning   . Acute respiratory failure with hypoxia ([place]) 12/25/2015  . HCAP (healthcare-associated pneumonia) 12/23/2015  . Hyponatremia 12/23/2015  . GERD (gastroesophageal reflux disease) 12/23/2015  . Healthcare-associated pneumonia   . Hypoxia 12/22/2015  . Typical atrial flutter ([place])   . Productive cough 11/10/2015  . Hypertensive heart disease   . CKD (chronic kidney disease), stage III   . Moderate aortic stenosis   . Chronic diastolic CHF (congestive heart failure) ([place])   . CAD (coronary artery disease)   . Hyperlipidemia   . Persistent atrial fibrillation ([place])   . Fever 10/11/2015  . Dystrophic nail 10/08/2015  . Abnormal finding on thyroid function test 09/24/2015  . Acute on chronic diastolic heart failure ([place]) 09/24/2015  . Injury of kidney 09/24/2015  . At risk for infection 09/24/2015  . Type 2 diabetes mellitus ([place]) 09/24/2015  . Pain in the chest   . Acute on chronic diastolic HF (heart failure) ([place])   . Atrial fibrillation with RVR ([place])   . Atrial flutter ([place])   . Chest pain on exertion 09/09/2015  . Elevated troponin 09/09/2015  . Kidney disease, chronic,  stage III (GFR 30-59 ml/min) 09/09/2015  . Anemia of chronic disease 09/09/2015  . Anemia in chronic illness 09/09/2015  . Chest pain 09/09/2015  . Chronic kidney disease (CKD), stage III (moderate) 09/09/2015  . Acute posthemorrhagic anemia   . Abnormal thyroid function test   . Arterial hypotension   . Paroxysmal atrial fibrillation (HCC)   . Diabetes mellitus with complication ([place])   . Symptomatic cholelithiasis 09/07/2015  . Calculus of gallbladder 09/07/2015  . Spondylosis of lumbar region without myelopathy or radiculopathy   . Urinary retention 08/12/2015  . Bladder retention 08/12/2015  . Chronic diastolic congestive heart failure ([place]) 08/10/2015  . Chronic diastolic heart failure ([place]) 08/10/2015  . Back pain without radiation   . Counseling regarding advanced care planning and goals of care   . S/P lumbar laminectomy 07/17/2015  . Postprocedural state 07/17/2015  . Back pain, thoracic 01/15/2015  . Chest wall pain 01/15/2015  . Encounter for Medicare annual wellness exam 07/07/2013  . BPH (benign prostatic hyperplasia) 07/05/2012  . Enlarged prostate 07/05/2012  . Prostate cancer screening 06/27/2012  . Low TSH level 01/22/2012  . PMR (polymyalgia rheumatica) (HCC) 01/20/2012  . Polymyalgia rheumatica ([place]) 01/20/2012  . HYPERKALEMIA 02/18/2010  . ANGIODYSPLASIA-INTESTINE 06/10/2009  . Angiodysplasia of colon 06/10/2009  . MURMUR 05/21/2009  . Left carotid bruit 05/21/2009  . Cardiac murmur 05/21/2009  . Coronary atherosclerosis 03/09/2009  . CAD in native artery 03/09/2009  . Rheumatoid arthritis ([place]) 04/29/2008  . HX, PERSONAL, COLONIC POLYPS  01/17/2007  . History of colon polyps 01/17/2007  . PSORIATIC ARTHRITIS 09/18/2006  . PSORIASIS 09/18/2006  . 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 DEGENERATIVE DISC DISEASE 09/18/2006  . SCOLIOSIS 09/18/2006  . TOBACCO ABUSE, HX OF 09/18/2006  . 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 Narrowing of intervertebral  disc space 09/18/2006  . Idiopathic scoliosis 09/18/2006  . Nicotine addiction 09/18/2006    Orientation RESPIRATION BLADDER Height & Weight     Self, Time, Situation, Place  O2 (Nasal Canula 2 L) Continent Weight: 152 lb 9.6 oz (69.219 kg) (scale b) Height:  5\' 8"  (172.7 cm)  BEHAVIORAL SYMPTOMS/MOOD NEUROLOGICAL BOWEL NUTRITION STATUS   (None)  (None) Incontinent Diet (Heart healthy/carb-modified)  AMBULATORY STATUS COMMUNICATION OF NEEDS Skin   Limited Assist Verbally Other (Comment) (Ecchymosis, Rash)                       Personal Care Assistance Level of Assistance  Bathing, Feeding, Dressing Bathing Assistance: Limited assistance Feeding assistance: Independent Dressing Assistance: Limited assistance     Functional Limitations Info  Sight, Hearing, Speech Sight Info: Adequate Hearing Info: Adequate Speech Info: Adequate    SPECIAL CARE FACTORS FREQUENCY  Blood pressure, Diabetic urine testing, PT (By licensed PT), OT (By licensed OT)     PT Frequency: 5 x week OT Frequency: 5 x week            Contractures Contractures Info: Not present    Additional Factors Info  Code Status, Allergies Code Status Info: Full Allergies Info: Ace Inhibitors, Angiotensn Receptor Blockers, Metoprolol, Penicillins, Amiodarone, Tetracycline           Current Medications (12/31/2015):  This is the current hospital active medication list Current Facility-Administered Medications  Medication Dose Route Frequency Provider Last Rate Last Dose  . acetaminophen (TYLENOL) tablet 650 mg  650 mg Oral Q6H PRN Ivor Costa, MD      . albuterol (PROVENTIL) (2.5 MG/3ML) 0.083% nebulizer solution 2.5 mg  2.5 mg Nebulization Q4H PRN Ivor Costa, MD   2.5 mg at 12/29/15 0847  . calcium-vitamin D (OSCAL WITH D) 500-200 MG-UNIT per tablet 1 tablet  1 tablet Oral Q breakfast Ivor Costa, MD   1 tablet at 12/31/15 0904  . dextromethorphan-guaiFENesin (MUCINEX DM) 30-600 MG per 12 hr tablet 2 tablet   2 tablet Oral BID Eugenie Filler, MD   2 tablet at 12/31/15 [phone]  . diltiazem (CARDIZEM CD) 24 hr capsule 240 mg  240 mg Oral Daily Ivor Costa, MD   240 mg at 12/31/15 0905  . docusate sodium (COLACE) capsule 100 mg  100 mg Oral BID Ivor Costa, MD   100 mg at 12/31/15 0904  . ferrous sulfate tablet 325 mg  325 mg Oral TID WC Ivor Costa, MD   325 mg at 12/31/15 1315  . insulin aspart (novoLOG) injection 0-15 Units  0-15 Units Subcutaneous TID WC Eugenie Filler, MD   3 Units at 12/31/15 1315  . insulin aspart (novoLOG) injection 0-5 Units  0-5 Units Subcutaneous QHS Eugenie Filler, MD   2 Units at 12/28/15 2132  . insulin aspart (novoLOG) injection 3 Units  3 Units Subcutaneous TID WC Eugenie Filler, MD   3 Units at 12/31/15 1315  . insulin detemir (LEVEMIR) injection 15 Units  15 Units Subcutaneous Daily Eugenie Filler, MD   15 Units at 12/31/15 [phone]  . levofloxacin (LEVAQUIN) tablet 750 mg  750 mg Oral Q48H Eugenie Filler,  MD   750 mg at 12/29/15 2127  . ondansetron (ZOFRAN) injection 4 mg  4 mg Intravenous Q6H PRN Eugenie Filler, MD      . pantoprazole (PROTONIX) EC tablet 40 mg  40 mg Oral Daily Ivor Costa, MD   40 mg at 12/31/15 0904  . pravastatin (PRAVACHOL) tablet 20 mg  20 mg Oral QPM Ivor Costa, MD   20 mg at 12/30/15 1814  . predniSONE (DELTASONE) tablet 5 mg  5 mg Oral Q breakfast Ivor Costa, MD   5 mg at 12/31/15 0905  . thiamine (VITAMIN B-1) tablet 100 mg  100 mg Oral Daily Ivor Costa, MD   100 mg at 12/31/15 0905  . Warfarin - Pharmacist Dosing Inpatient   Does not apply [place], [facility]   1 each at 12/30/15 1827     Discharge Medications: Please see discharge summary for a list of discharge medications.  Relevant Imaging Results:  Relevant Lab Results:   Additional Information SS#: [id number]  Candie Chroman, LCSW

## 2015-12-31 NOTE — Care Management Important Message (Signed)
Important Message  Patient Details  Name: Curtis Frazier MRN: [id number] Date of Birth: Nov 29, 1940   Medicare Important Message Given:  Yes    Kiyana Vazguez, [place] 12/31/2015, 8:20 AM

## 2015-12-31 NOTE — Progress Notes (Signed)
SATURATION QUALIFICATIONS: (This note is used to comply with regulatory documentation for home oxygen)  Patient Saturations on Room Air at Rest = 80%  Patient Saturations on Room Air while Ambulating = 80%  Patient Saturations on 2 Liters of oxygen while Ambulating = 87%  Please briefly explain why patient needs home oxygen:

## 2015-12-31 NOTE — Clinical Social Work Note (Signed)
Clinical Social Work Assessment  Patient Details  Name: Curtis Frazier MRN: [id number] Date of Birth: 11-Dec-1940  Date of referral:  12/31/15               Reason for consult:  Facility Placement, Discharge Planning                Permission sought to share information with:  Facility Sport and exercise psychologist, Family Supports Permission granted to share information::  Yes, Verbal Permission Granted  Name::        Agency::  SNF's  Relationship::     Contact Information:     Housing/Transportation Living arrangements for the past 2 months:  Single Family Home Source of Information:  Patient, Medical Team, Adult Children Patient Interpreter Needed:  None Criminal Activity/Legal Involvement Pertinent to Current Situation/Hospitalization:  No - Comment as needed Significant Relationships:  Adult Children, Spouse Lives with:  Spouse Do you feel safe going back to the place where you live?  Yes Need for family participation in patient care:  Yes (Comment)  Care giving concerns:  PT recommending SNF once medically stable for discharge.   Social Worker assessment / plan:  CSW met with patient. Son at bedside. CSW introduced role and explained that PT recommendations would be discussed. Patient and his son are not decided on SNF. Patient's son wanted to discuss with his other siblings before making a decision. Patient and his son did give permission to go ahead and fax out information to SNF's to have a list ready when decision made. No further concerns. CSW encouraged patient and his son to contact CSW as needed. CSW will continue to follow patient and his family and facilitate discharge to SNF once medically stable, if agreeable.  Employment status:  Retired Forensic scientist:  Medicare PT Recommendations:  [place] / Referral to community resources:  [place]  Patient/Family's Response to care:  Patient and his son are unsure if they want SNF.  Patient's family supportive and involved in patient's care. Patient and his son appreciated social work intervention.  Patient/Family's Understanding of and Emotional Response to Diagnosis, Current Treatment, and Prognosis:  Patient and his son understand need for physical therapy but unsure if they want him to be in a SNF for that. Patient and his son appear happy with hospital care so far.  Emotional Assessment Appearance:  Appears stated age Attitude/Demeanor/Rapport:   (Pleasant) Affect (typically observed):  Accepting, Appropriate, Calm, Pleasant Orientation:  Oriented to Self, Oriented to Place, Oriented to  Time, Oriented to Situation Alcohol / Substance use:  Tobacco Use Psych involvement (Current and /or in the community):  No (Comment)  Discharge Needs  Concerns to be addressed:  Care Coordination Readmission within the last 30 days:  No Current discharge risk:  Dependent with Mobility Barriers to Discharge:  No Barriers Identified   Candie Chroman, LCSW 12/31/2015, 2:39 PM

## 2015-12-31 NOTE — Clinical Social Work Placement (Signed)
   CLINICAL SOCIAL WORK PLACEMENT  NOTE  Date:  12/31/2015  Patient Details  Name: Curtis Frazier MRN: [id number] Date of Birth: [birth year]  Clinical Social Work is seeking post-discharge placement for this patient at the [place] level of care (*CSW will initial, date and re-position this form in  chart as items are completed):  Yes   Patient/family provided with [place] Work Department's list of facilities offering this level of care within the geographic area requested by the patient (or if unable, by the patient's family).  Yes   Patient/family informed of their freedom to choose among providers that offer the needed level of care, that participate in Medicare, Medicaid or managed care program needed by the patient, have an available bed and are willing to accept the patient.  Yes   Patient/family informed of [place]'s ownership interest in [facility] and [facility], as well as of the fact that they are under no obligation to receive care at these facilities.  PASRR submitted to EDS on 12/31/15     PASRR number received on       Existing PASRR number confirmed on 12/31/15     FL2 transmitted to all facilities in geographic area requested by pt/family on 12/31/15     FL2 transmitted to all facilities within larger geographic area on 12/31/15     Patient informed that his/her managed care company has contracts with or will negotiate with certain facilities, including the following:            Patient/family informed of bed offers received.  Patient chooses bed at       Physician recommends and patient chooses bed at      Patient to be transferred to   on  .  Patient to be transferred to facility by       Patient family notified on   of transfer.  Name of family member notified:        PHYSICIAN Please sign FL2     Additional Comment:    _______________________________________________ Candie Chroman, LCSW 12/31/2015,  2:42 PM

## 2016-01-01 LAB — GLUCOSE, CAPILLARY
GLUCOSE-CAPILLARY: 162 mg/dL — AB (ref 65–99)
GLUCOSE-CAPILLARY: 202 mg/dL — AB (ref 65–99)
Glucose-Capillary: 159 mg/dL — ABNORMAL HIGH (ref 65–99)

## 2016-01-01 LAB — PROTIME-INR
INR: 2.42 — AB (ref 0.00–1.49)
PROTHROMBIN TIME: 26 s — AB (ref 11.6–15.2)

## 2016-01-01 MED ORDER — INSULIN ASPART 100 UNIT/ML ~~LOC~~ SOLN
SUBCUTANEOUS | Status: DC
Start: 2016-01-01 — End: 2016-01-19

## 2016-01-01 MED ORDER — ALBUTEROL SULFATE (2.5 MG/3ML) 0.083% IN NEBU: 2.5000 mg | INHALATION_SOLUTION | RESPIRATORY_TRACT | Status: DC | PRN

## 2016-01-01 MED ORDER — INSULIN DETEMIR 100 UNIT/ML ~~LOC~~ SOLN: 15.0000 [IU] | Freq: Every day | SUBCUTANEOUS | Status: DC

## 2016-01-01 MED ORDER — WARFARIN SODIUM 2.5 MG PO TABS
2.5000 mg | ORAL_TABLET | Freq: Once | ORAL | Status: AC
  Administered 2016-01-01: 2.5 mg via ORAL
  Filled 2016-01-01: qty 1

## 2016-01-01 NOTE — Clinical Social Work Note (Signed)
CSW notified that pt medically cleared for discharge. CSW contacted pt family and informed pt discharging from hospital today. CSW contacted [place] to inform pt discharging and will arrive to SNF via [facility]. CSW contacted [facility], and requested nurse call SNF and give report.

## 2016-01-01 NOTE — Clinical Social Work Placement (Signed)
   CLINICAL SOCIAL WORK PLACEMENT  NOTE  Date:  01/01/2016  Patient Details  Name: Curtis Frazier MRN: [id number] Date of Birth: 02/11/1941  Clinical Social Work is seeking post-discharge placement for this patient at the [place] level of care (*CSW will initial, date and re-position this form in  chart as items are completed):  Yes   Patient/family provided with [place] Work Department's list of facilities offering this level of care within the geographic area requested by the patient (or if unable, by the patient's family).  Yes   Patient/family informed of their freedom to choose among providers that offer the needed level of care, that participate in Medicare, Medicaid or managed care program needed by the patient, have an available bed and are willing to accept the patient.  Yes   Patient/family informed of 's ownership interest in [facility] and [facility], as well as of the fact that they are under no obligation to receive care at these facilities.  PASRR submitted to EDS on 12/31/15     PASRR number received on       Existing PASRR number confirmed on 12/31/15     FL2 transmitted to all facilities in geographic area requested by pt/family on 12/31/15     FL2 transmitted to all facilities within larger geographic area on 12/31/15     Patient informed that his/her managed care company has contracts with or will negotiate with certain facilities, including the following:        Yes   Patient/family informed of bed offers received.  Patient chooses bed at [facility]     Physician recommends and patient chooses bed at      Patient to be transferred to [facility] on 01/01/16.  Patient to be transferred to facility by Ambulance     Patient family notified on 01/01/16 of transfer.  Name of family member notified:  [name] at bedside     PHYSICIAN Please sign FL2, Please prepare priority discharge summary, including  medications, Please prepare prescriptions     Additional Comment:  Per MD patient ready for DC to [facility]. RN, patient, patient's family, and facility notified of DC. RN given number for report. DC packet on chart. Ambulance transport requested for patient. CSW signing off.   _______________________________________________ Rigoberto Noel, LCSW 01/01/2016, 5:37 PM

## 2016-01-01 NOTE — Progress Notes (Signed)
ANTICOAGULATION CONSULT NOTE - Follow Up Consult  Pharmacy Consult:  Coumadin Indication: atrial fibrillation  Allergies  Allergen Reactions  . Ace Inhibitors Other (See Comments)    Causes high K   . Angiotensin Receptor Blockers Other (See Comments)    Causes high K   . Metoprolol Other (See Comments)    May cause elevated K level   . Penicillins Swelling and Rash    Has patient had a PCN reaction causing immediate rash, facial/tongue/throat swelling, SOB or lightheadedness with hypotension: YES Has patient had a PCN reaction causing severe rash involving mucus membranes or skin necrosis: NO Has patient had a PCN reaction that required hospitalization NO Has patient had a PCN reaction occurring within the last 10 years: NO If all of the above answers are "NO", then may proceed with Cephalosporin use. Pt has tolerated cephalosporins in the past   . Amiodarone Other (See Comments)    12/27/15: Cardiologist discontinued the amiodarone (on PTA) due to  concern for toxicity  (in hospitial with acute hypoxemic respiratory failure)   . Tetracycline Swelling and Rash    Patient Measurements: Height: 5\' 8"  (172.7 cm) Weight: 152 lb 8 oz (69.174 kg) (b scale) IBW/kg (Calculated) : 68.4  Vital Signs: Temp: 98.3 F (36.8 C) (07/22 1208) Temp Source: Oral (07/22 1208) BP: 102/48 mmHg (07/22 1208) Pulse Rate: 86 (07/22 1208)  Labs:  Recent Labs  12/30/15 0547 12/31/15 0401 01/01/16 0303  LABPROT 26.3* 27.3* 26.0*  INR 2.45* 2.57* 2.42*    Estimated Creatinine Clearance: 38 mL/min (by C-G formula based on Cr of 1.6).  Assessment: 25 YOM continues on Coumadin from PTA for history of AFib (PAF).  INR dipped below therapeutic range 7/15 and Lovenox bridge added given recent ablation. Lovenox d/c'd 7/17 with therapeutic INR. INR stable therapeutic today on Coumadin.  Home dose: 2.5mg  daily except 5mg  MWF (INR was 2.29 on admit)   Goal of Therapy:  INR 2-3 Anti-Xa level  0.6-1 units/ml 4hrs after LMWH dose given   Plan:  - Coumadin 2.5mg  PO x 1 dose tonight (home dose) - Daily PT / INR, potential for interactions with levofloxacin and due to discontinuation of amiodarone that patient was on PTA. - Monitor s/sx bleeding  Thank you Anette Guarneri, PharmD [phone] 01/01/2016 12:16 PM

## 2016-01-01 NOTE — Discharge Summary (Signed)
Physician Discharge Summary  Curtis Frazier [id number] DOB: Oct 16, 1940 DOA: 12/22/2015  PCP: Loura Pardon, MD  Admit date: 12/22/2015 Discharge date: 01/01/2016  Admitted From: Home Disposition: [facility]  Recommendations for Outpatient Follow-up:  1. Follow up with PCP in 1-2 weeks 2. Please obtain BMP/CBC in one week 3. Please follow up on the following pending results:   Equipment/Devices: 2 to 4 lit of [place] oxygen to keep sats between 88 to 92%  Discharge Condition:guarded.  CODE STATUS: full code.  Diet recommendation: Heart Healthy / Carb Modified   Brief/Interim Summary: Patient is a 75 year gentleman history of hyperlipidemia, diabetes, chronic kidney disease stage III, diastolic CHF, paroxysmal atrial fibrillation on Coumadin/atrial flutter status post recent CTI ablation per cardiology 12/15/2015 who presents to the ED with a 2 day history of cough, shortness of breath, chest pain pleuritic in nature with subjective fever and noted per chest x-ray to have a right middle and right lower lobe pneumonia. Completed total 9 days of antibiotics.  Plan for SNF when bed available.   Discharge Diagnoses:  Principal Problem:   Acute respiratory failure with hypoxia (HCC) Active Problems:   Coronary atherosclerosis   Rheumatoid arthritis (HCC)   PMR (polymyalgia rheumatica) (HCC)   Back pain without radiation   Chronic diastolic congestive heart failure (HCC)   Paroxysmal atrial fibrillation (HCC)   Diabetes mellitus with complication (HCC)   Kidney disease, chronic, stage III (GFR 30-59 ml/min)   Atrial flutter (HCC)   Acute on chronic diastolic HF (heart failure) (HCC)   Atrial fibrillation with RVR (HCC)   Hyperlipidemia   Hypoxia   HCAP (healthcare-associated pneumonia)   Hyponatremia   GERD (gastroesophageal reflux disease)   Healthcare-associated pneumonia   Complex care coordination   Palliative care by specialist   Advance care planning   Goals of care,  counseling/discussion   Acute respiratory failure with hypoxia possibly from a combination of health care associated pneumonia and acute on chronic diastolic heart failure, vs amiodarone toxicity. Started on broad spectrum IV antibiotics and transitioned to levaquin to complete the course.  Weaned him down to 2 lit [place] oxygen on 7/19 and would remain on the same.  [place] oxygen to keep sats around 88 to 91%.  duonebs as needed.  CT chest negative for PE but showed worsening hilar and mediastinal lymphadenopathy, recommend outpatient pulmonology follow up with a PET scan to evaluate for malignancy and evaluation of sleep apnea. Meanwhile he was also treated for acute onchronic diastolic heart failure with IV lasix.  No further lasix as per cardiology, and amiodarone discontinued for now, .  Appreciate cardiology and pulmonary recommendations.  Outpatient follow up .  Completed the course of antibiotics.    Diabetes Mellitus: CBG (last 3)   Recent Labs (last 2 labs)      Recent Labs  12/31/15 0639 12/31/15 1201 12/31/15 1630  GLUCAP 177* 153* 162*      Resume the same regimen.  Resume lantus and SSI.   Paroxysmal atrial fibrillation: in sinus, and on cardizem and coumadin for anticoagulation.  Rate controlled.  Amiodarone discontinued by cardiology.    Acute encephalopathy possibly toxic encephalopathy from respiratory failure.  Resolved.   In view of his medical co morbidities,decoditioned stage, requested palliative care consult for goals of care. Family decided for full treatment at this time.  Plan for SNF on discharge , currently awaiting bed .        Discharge Instructions  Discharge Instructions    (HEART  FAILURE PATIENTS) Call MD:  Anytime you have any of the following symptoms: 1) 3 pound weight gain in 24 hours or 5 pounds in 1 week 2) shortness of breath, with or without a dry hacking cough 3) swelling in the hands, feet or stomach 4) if you  have to sleep on extra pillows at night in order to breathe.    Complete by:  As directed      Ambulatory referral to Physical Therapy    Complete by:  As directed      Diet - low sodium heart healthy    Complete by:  As directed      Discharge instructions    Complete by:  As directed   pleae follow up with PCP in one week,  Please check INR  To keep it greater than 2.  Please follow up with pulmonologist in 1 to 2 weeks, to follow up with a PET scan for evaluation of the worsening hilar and mediastinal lymphadenopathy.  Please follow up with cardiology in 2 weeks.            Medication List    STOP taking these medications        amiodarone 200 MG tablet  Commonly known as:  PACERONE     glipiZIDE 10 MG tablet  Commonly known as:  GLUCOTROL      TAKE these medications        acetaminophen 325 MG tablet  Commonly known as:  TYLENOL  Take 650 mg by mouth every 6 (six) hours as needed (Pain scale of 1-4 or temp > 101).     albuterol 108 (90 Base) MCG/ACT inhaler  Commonly known as:  PROVENTIL HFA;VENTOLIN HFA  Inhale 2 puffs into the lungs every 6 (six) hours as needed for wheezing or shortness of breath.     albuterol (2.5 MG/3ML) 0.083% nebulizer solution  Commonly known as:  PROVENTIL  Take 3 mLs (2.5 mg total) by nebulization every 4 (four) hours as needed for wheezing or shortness of breath.     calcium-vitamin D 500-200 MG-UNIT tablet  Commonly known as:  OSCAL WITH D  Take 1 tablet by mouth daily with breakfast.     diltiazem 240 MG 24 hr capsule  Commonly known as:  CARDIZEM CD  TAKE 1 CAPSULE BY MOUTH DAILY     docusate sodium 100 MG capsule  Commonly known as:  COLACE  Take 100 mg by mouth 2 (two) times daily. Reported on 09/22/2015     ferrous sulfate 325 (65 FE) MG tablet  Take 1 tablet (325 mg total) by mouth 3 (three) times daily with meals.     glucose blood test strip  Commonly known as:  ONE TOUCH ULTRA TEST  Check blood sugar three times daily  and as directed. Dx E11.8 diabetes that is poorly controlled     insulin aspart 100 UNIT/ML injection  Commonly known as:  novoLOG  CBG 70 - 120: 0 units CBG 121 - 150: 2 units CBG 151 - 200: 3 units CBG 201 - 250: 5 units CBG 251 - 300: 8 units CBG 301 - 350: 11 units CBG 351 - 400: 15 units     insulin detemir 100 UNIT/ML injection  Commonly known as:  LEVEMIR  Inject 0.15 mLs (15 Units total) into the skin daily.     pantoprazole 40 MG tablet  Commonly known as:  PROTONIX  Take 1 tablet (40 mg total) by mouth daily.  pravastatin 20 MG tablet  Commonly known as:  PRAVACHOL  Take 1 tablet (20 mg total) by mouth every evening.     predniSONE 5 MG tablet  Commonly known as:  DELTASONE  Take 1 tablet (5 mg total) by mouth daily with breakfast.     thiamine 100 MG tablet  Take 1 tablet (100 mg total) by mouth daily.     tiotropium 18 MCG inhalation capsule  Commonly known as:  SPIRIVA  Place 1 capsule (18 mcg total) into inhaler and inhale daily.     warfarin 2.5 MG tablet  Commonly known as:  COUMADIN  Take 2.5-5 mg by mouth See admin instructions. Take 2 tablets on Monday, Wednesday and Friday then take 1 tablet all the other days           Follow-up Information    Follow up with Loura Pardon, MD. Schedule an appointment as soon as possible for a visit in 1 week.   Specialties:  Family Medicine, Radiology   Contact information:   [address] [place] [address] [phone]       Follow up with SOOD,VINEET, MD. Schedule an appointment as soon as possible for a visit in 2 weeks.   Specialty:  Pulmonary Disease   Why:  for PET scan for evaluation of the worsening hilar and mediastinal lymphadenopathy.    Contact information:   [address] [address] [phone]       Follow up with Jenkins Rouge, MD. Schedule an appointment as soon as possible for a visit in 2 weeks.   Specialty:  Cardiology   Contact  information:   [id number] [place] [address] [phone]      Allergies  Allergen Reactions  . Ace Inhibitors Other (See Comments)    Causes high K   . Angiotensin Receptor Blockers Other (See Comments)    Causes high K   . Metoprolol Other (See Comments)    May cause elevated K level   . Penicillins Swelling and Rash    Has patient had a PCN reaction causing immediate rash, facial/tongue/throat swelling, SOB or lightheadedness with hypotension: YES Has patient had a PCN reaction causing severe rash involving mucus membranes or skin necrosis: NO Has patient had a PCN reaction that required hospitalization NO Has patient had a PCN reaction occurring within the last 10 years: NO If all of the above answers are "NO", then may proceed with Cephalosporin use. Pt has tolerated cephalosporins in the past   . Amiodarone Other (See Comments)    12/27/15: Cardiologist discontinued the amiodarone (on PTA) due to  concern for toxicity  (in hospitial with acute hypoxemic respiratory failure)   . Tetracycline Swelling and Rash    Consultations:  Cardiology  PCCM     Procedures/Studies: Dg Chest 2 View  12/28/2015  CLINICAL DATA:  Shortness of breath.  Pneumonia. EXAM: CHEST  2 VIEW COMPARISON:  Chest CT 12/25/2015. Chest x-ray 12/24/2015, 10/11/2015. FINDINGS: Persistent bilateral hilar fullness noted consistent previously identified adenopathy. Cardiomegaly. Diffuse pulmonary interstitial prominence again noted. Active interstitial lung disease and/or interstitial edema could present this fashion. No pleural effusion or pneumothorax. Prior thoracolumbar spine fusion. Thoracolumbar scoliosis. IMPRESSION: 1. Persistent bilateral hilar fullness noted consistent with previously identified adenopathy. Reference is made to chest CT report 12/25/2015. 2.  Stable cardiomegaly. 3. Persistent bilateral pulmonary interstitial prominence consistent with active interstitial  lung disease and/or interstitial edema. Electronically Signed  By: [place]   On: 12/28/2015 07:28   Dg Chest 2 View  12/22/2015  CLINICAL DATA:  Cough and congestion for 2 days. Nausea. Question pneumonia. EXAM: CHEST  2 VIEW COMPARISON:  Two-view chest x-ray 11/10/2015. FINDINGS: The heart is mildly enlarged. There is slight increase in a diffuse interstitial pattern suggesting edema superimposed on chronic interstitial coarsening. Superior segment right lower lobe and right upper lobe airspace disease is present. Remote compression fractures are again noted in the mid thoracic spine. Thoracolumbar fusion is noted. IMPRESSION: 1. Superior segment right lower lobe and right upper lobe airspace disease concerning for pneumonia. 2. Mild generalized edema superimposed on chronic interstitial disease. Electronically Signed   By: San Morelle M.D.   On: 12/22/2015 18:36   Ct Angio Chest Pe W Or Wo Contrast  12/25/2015  CLINICAL DATA:  Shortness of breath and hypoxia. EXAM: CT ANGIOGRAPHY CHEST WITH CONTRAST TECHNIQUE: Multidetector CT imaging of the chest was performed using the standard protocol during bolus administration of intravenous contrast. Multiplanar CT image reconstructions and MIPs were obtained to evaluate the vascular anatomy. CONTRAST:  100 cc Isovue-300 COMPARISON:  09/06/2015. FINDINGS: Mediastinum / Lymph Nodes: There is no axillary lymphadenopathy. Stable 15 mm left thyroid nodule. Mediastinal lymphadenopathy is progressed in the interval. 8 mm short axis prevascular lymph node. 14 mm short axis precarinal lymph node on today's study measured only 10 mm short axis previously. 18 mm short axis right hilar lymph node is associated with a 14 mm short axis infrahilar right lymph node. Heart is enlarged. Coronary artery calcification is noted. The esophagus has normal imaging features. No evidence for filling defect in the opacified pulmonary arteries to suggest the presence of an  acute pulmonary embolus. Lungs / Pleura: Interlobular septal thickening is associated with bilateral patchy ground-glass attenuation superimposed on emphysema. More focal airspace collapse/consolidation is identified in the lower lobes bilaterally. Upper Abdomen:  Unremarkable. MSK / Soft Tissues:  Incomplete visualization thoracolumbar fusion. Review of the MIP images confirms the above findings. IMPRESSION: 1. No CT evidence for acute pulmonary embolus. 2. Interlobular septal thickening with bilateral ground-glass attenuation. Imaging features are compatible with pulmonary edema although superimposed infectious/inflammatory etiology is not excluded. Bibasilar collapse/ consolidation. 3. Interval progression of mediastinal and right hilar lymphadenopathy, nonspecific. Underlying neoplasm not excluded and PET-CT may prove helpful to further evaluate. Electronically Signed   By: Misty Stanley M.D.   On: 12/25/2015 13:28   Dg Chest Port 1 View  12/29/2015  CLINICAL DATA:  Hypoxia with congestion for 1 day EXAM: PORTABLE CHEST 1 VIEW COMPARISON:  Chest radiographs December 28, 2015 and Oct 11, 2015 ; chest CT December 25, 2015 FINDINGS: There is diffuse interstitial and patchy alveolar opacities, likely pulmonary edema. There is focal consolidation in the left lower lobe, stable from 1 day prior. No new opacity is evident. Heart size is upper normal. There is pulmonary venous hypertension. There is evidence of hilar adenopathy bilaterally, stable. No pneumothorax. There is postoperative change in the lower thoracic and visualized upper lumbar spine. IMPRESSION: Findings felt to be most consistent with congestive heart failure, stable from 1 day prior. Suspect superimposed pneumonia left lower lobe. There may be a degree of underlying ARDS as well. More than one of these entities may exist concurrently. Stable cardiac silhouette. Evidence of hilar adenopathy bilaterally, better seen on recent CT examination. Concern for  underlying neoplasm. Electronically Signed   By: Lowella Grip III M.D.   On: 12/29/2015 08:59   Dg  Chest Port 1 View  12/24/2015  CLINICAL DATA:  75 year old male with hypoxia. EXAM: PORTABLE CHEST 1 VIEW COMPARISON:  Chest radiograph dated 12/22/2015 FINDINGS: There is diffuse chronic interstitial coarsening and bronchiectatic changes. There is a patchy area of increased airspace density in the right upper lobe with progression compared to the study dated 12/22/2015 concerning for developing pneumonia. There is no pleural effusion or pneumothorax. Stable mild cardiomegaly. Osteopenia with degenerative changes of the spine. Lower thoracic and lumbar spine fixation hardware. No acute osseous pathology. IMPRESSION: Findings concerning for developing right upper lobe pneumonia on a background of chronic interstitial lung disease. Clinical correlation and follow-up recommended. Electronically Signed   By: Anner Crete M.D.   On: 12/24/2015 03:00       Subjective: No new complaints.   Discharge Exam: Filed Vitals:   01/01/16 0406 01/01/16 1208  BP: 119/49 102/48  Pulse: 88 86  Temp: 98.8 F (37.1 C) 98.3 F (36.8 C)  Resp: 20    Filed Vitals:   12/31/15 2029 01/01/16 0406 01/01/16 0423 01/01/16 1208  BP: 107/52 119/49  102/48  Pulse: 79 88  86  Temp: 98.3 F (36.8 C) 98.8 F (37.1 C)  98.3 F (36.8 C)  TempSrc: Oral Oral  Oral  Resp: 18 20    Height:      Weight:   69.174 kg (152 lb 8 oz)   SpO2: 88% 89%  66%    General: Pt is alert, awake, not in acute distress on 3 lit [place] oxygen. Cardiovascular: RRR, S1/S2 +, no rubs, no gallops Respiratory: diminished at bases, no wheezing or rhonchi.  Abdominal: Soft, NT, ND, bowel sounds + Extremities: no edema, no cyanosis    The results of significant diagnostics from this hospitalization (including imaging, microbiology, ancillary and laboratory) are listed below for reference.     Microbiology: Recent Results (from the  past 240 hour(s))  Blood culture (routine x 2)     Status: None   Collection Time: 12/22/15 10:55 PM  Result Value Ref Range Status   Specimen Description BLOOD RIGHT ANTECUBITAL  Final   Special Requests BOTTLES DRAWN AEROBIC AND ANAEROBIC 5CC  Final   Culture NO GROWTH 5 DAYS  Final   Report Status 12/28/2015 FINAL  Final  Blood culture (routine x 2)     Status: None   Collection Time: 12/22/15 11:00 PM  Result Value Ref Range Status   Specimen Description BLOOD RIGHT HAND  Final   Special Requests IN PEDIATRIC BOTTLE 2CC  Final   Culture NO GROWTH 5 DAYS  Final   Report Status 12/28/2015 FINAL  Final  MRSA PCR Screening     Status: None   Collection Time: 12/23/15  7:25 AM  Result Value Ref Range Status   MRSA by PCR NEGATIVE NEGATIVE Final    Comment:        The GeneXpert MRSA Assay (FDA approved for NASAL specimens only), is one component of a comprehensive MRSA colonization surveillance program. It is not intended to diagnose MRSA infection nor to guide or monitor treatment for MRSA infections.   Culture, sputum-assessment     Status: None   Collection Time: 12/23/15  5:15 PM  Result Value Ref Range Status   Specimen Description SPUTUM  Final   Special Requests NONE  Final   Sputum evaluation   Final    MICROSCOPIC FINDINGS SUGGEST THAT THIS SPECIMEN IS NOT REPRESENTATIVE OF LOWER RESPIRATORY SECRETIONS. PLEASE RECOLLECT. Deloris Ping RN [id number] 12/23/15 A  BROWNING    Report Status 12/23/2015 FINAL  Final  Gram stain     Status: None   Collection Time: 12/24/15 11:25 PM  Result Value Ref Range Status   Specimen Description EXPECTORATED SPUTUM  Final   Special Requests NONE  Final   Gram Stain   Final    MODERATE WBC PRESENT,BOTH PMN AND MONONUCLEAR RARE SQUAMOUS EPITHELIAL CELLS PRESENT FEW GRAM POSITIVE COCCI FEW GRAM VARIABLE ROD    Report Status 12/25/2015 FINAL  Final  Respiratory Panel by PCR     Status: None   Collection Time: 12/25/15  9:45 AM   Result Value Ref Range Status   Adenovirus NOT DETECTED NOT DETECTED Final   Coronavirus 229E NOT DETECTED NOT DETECTED Final   Coronavirus HKU1 NOT DETECTED NOT DETECTED Final   Coronavirus NL63 NOT DETECTED NOT DETECTED Final   Coronavirus OC43 NOT DETECTED NOT DETECTED Final   Metapneumovirus NOT DETECTED NOT DETECTED Final   Rhinovirus / Enterovirus NOT DETECTED NOT DETECTED Final   Influenza A NOT DETECTED NOT DETECTED Final   Influenza A H1 NOT DETECTED NOT DETECTED Final   Influenza A H1 2009 NOT DETECTED NOT DETECTED Final   Influenza A H3 NOT DETECTED NOT DETECTED Final   Influenza B NOT DETECTED NOT DETECTED Final   Parainfluenza Virus 1 NOT DETECTED NOT DETECTED Final   Parainfluenza Virus 2 NOT DETECTED NOT DETECTED Final   Parainfluenza Virus 3 NOT DETECTED NOT DETECTED Final   Parainfluenza Virus 4 NOT DETECTED NOT DETECTED Final   Respiratory Syncytial Virus NOT DETECTED NOT DETECTED Final   Bordetella pertussis NOT DETECTED NOT DETECTED Final   Chlamydophila pneumoniae NOT DETECTED NOT DETECTED Final   Mycoplasma pneumoniae NOT DETECTED NOT DETECTED Final     Labs: BNP (last 3 results)  Recent Labs  12/22/15 2359 12/25/15 1011 12/26/15 0358  BNP 136.8* 186.4* [id number]*   Basic Metabolic Panel:  Recent Labs Lab 12/26/15 0358 12/27/15 0440 12/29/15 0420  NA 129* 129* 130*  K 3.9 4.0 4.4  CL 95* 96* 95*  CO2 25 25 27   GLUCOSE 242* 185* 113*  BUN 26* 32* 32*  CREATININE 1.65* 1.84* 1.60*  CALCIUM 9.1 9.2 9.7  MG 1.7  --   --    Liver Function Tests:  Recent Labs Lab 12/30/15 0547  ALBUMIN 2.1*   No results for input(s): LIPASE, AMYLASE in the last 168 hours. No results for input(s): AMMONIA in the last 168 hours. CBC:  Recent Labs Lab 12/26/15 0358 12/27/15 0440 12/28/15 0738 12/29/15 0420  WBC 12.2* 11.5* 13.3* 12.7*  NEUTROABS 9.3*  --   --   --   HGB 11.3* 11.6* 11.7* 12.0*  HCT 34.3* 36.1* 35.5* 36.9*  MCV 90.0 89.8 89.4 91.1  PLT  317 339 359 350   Cardiac Enzymes: No results for input(s): CKTOTAL, CKMB, CKMBINDEX, TROPONINI in the last 168 hours. BNP: Invalid input(s): POCBNP CBG:  Recent Labs Lab 12/31/15 1201 12/31/15 1630 12/31/15 2040 01/01/16 0546 01/01/16 1135  GLUCAP 153* 162* 105* 162* 202*   D-Dimer No results for input(s): DDIMER in the last 72 hours. Hgb A1c No results for input(s): HGBA1C in the last 72 hours. Lipid Profile No results for input(s): CHOL, HDL, LDLCALC, TRIG, CHOLHDL, LDLDIRECT in the last 72 hours. Thyroid function studies No results for input(s): TSH, T4TOTAL, T3FREE, THYROIDAB in the last 72 hours.  Invalid input(s): FREET3 Anemia work up No results for input(s): VITAMINB12, FOLATE, FERRITIN, TIBC, IRON, RETICCTPCT in the  last 72 hours. Urinalysis    Component Value Date/Time   COLORURINE YELLOW 09/07/2015 1720   APPEARANCEUR CLEAR 09/07/2015 1720   LABSPEC 1.011 09/07/2015 1720   PHURINE 5.5 09/07/2015 1720   GLUCOSEU NEGATIVE 09/07/2015 1720   HGBUR NEGATIVE 09/07/2015 1720   HGBUR negative 08/03/2010 0756   BILIRUBINUR NEGATIVE 09/07/2015 1720   BILIRUBINUR negative 09/25/2014 1305   KETONESUR NEGATIVE 09/07/2015 1720   PROTEINUR NEGATIVE 09/07/2015 1720   PROTEINUR 1+ 09/25/2014 1305   UROBILINOGEN negative 09/25/2014 1305   UROBILINOGEN 2.0* 07/11/2011 2021   NITRITE NEGATIVE 09/07/2015 1720   NITRITE negative 09/25/2014 1305   LEUKOCYTESUR NEGATIVE 09/07/2015 1720   Sepsis Labs Invalid input(s): PROCALCITONIN,  WBC,  LACTICIDVEN Microbiology Recent Results (from the past 240 hour(s))  Blood culture (routine x 2)     Status: None   Collection Time: 12/22/15 10:55 PM  Result Value Ref Range Status   Specimen Description BLOOD RIGHT ANTECUBITAL  Final   Special Requests BOTTLES DRAWN AEROBIC AND ANAEROBIC 5CC  Final   Culture NO GROWTH 5 DAYS  Final   Report Status 12/28/2015 FINAL  Final  Blood culture (routine x 2)     Status: None   Collection  Time: 12/22/15 11:00 PM  Result Value Ref Range Status   Specimen Description BLOOD RIGHT HAND  Final   Special Requests IN PEDIATRIC BOTTLE 2CC  Final   Culture NO GROWTH 5 DAYS  Final   Report Status 12/28/2015 FINAL  Final  MRSA PCR Screening     Status: None   Collection Time: 12/23/15  7:25 AM  Result Value Ref Range Status   MRSA by PCR NEGATIVE NEGATIVE Final    Comment:        The GeneXpert MRSA Assay (FDA approved for NASAL specimens only), is one component of a comprehensive MRSA colonization surveillance program. It is not intended to diagnose MRSA infection nor to guide or monitor treatment for MRSA infections.   Culture, sputum-assessment     Status: None   Collection Time: 12/23/15  5:15 PM  Result Value Ref Range Status   Specimen Description SPUTUM  Final   Special Requests NONE  Final   Sputum evaluation   Final    MICROSCOPIC FINDINGS SUGGEST THAT THIS SPECIMEN IS NOT REPRESENTATIVE OF LOWER RESPIRATORY SECRETIONS. PLEASE RECOLLECT. Deloris Ping RN [id number] 12/23/15 A BROWNING    Report Status 12/23/2015 FINAL  Final  Gram stain     Status: None   Collection Time: 12/24/15 11:25 PM  Result Value Ref Range Status   Specimen Description EXPECTORATED SPUTUM  Final   Special Requests NONE  Final   Gram Stain   Final    MODERATE WBC PRESENT,BOTH PMN AND MONONUCLEAR RARE SQUAMOUS EPITHELIAL CELLS PRESENT FEW GRAM POSITIVE COCCI FEW GRAM VARIABLE ROD    Report Status 12/25/2015 FINAL  Final  Respiratory Panel by PCR     Status: None   Collection Time: 12/25/15  9:45 AM  Result Value Ref Range Status   Adenovirus NOT DETECTED NOT DETECTED Final   Coronavirus 229E NOT DETECTED NOT DETECTED Final   Coronavirus HKU1 NOT DETECTED NOT DETECTED Final   Coronavirus NL63 NOT DETECTED NOT DETECTED Final   Coronavirus OC43 NOT DETECTED NOT DETECTED Final   Metapneumovirus NOT DETECTED NOT DETECTED Final   Rhinovirus / Enterovirus NOT DETECTED NOT DETECTED  Final   Influenza A NOT DETECTED NOT DETECTED Final   Influenza A H1 NOT DETECTED NOT DETECTED Final  Influenza A H1 2009 NOT DETECTED NOT DETECTED Final   Influenza A H3 NOT DETECTED NOT DETECTED Final   Influenza B NOT DETECTED NOT DETECTED Final   Parainfluenza Virus 1 NOT DETECTED NOT DETECTED Final   Parainfluenza Virus 2 NOT DETECTED NOT DETECTED Final   Parainfluenza Virus 3 NOT DETECTED NOT DETECTED Final   Parainfluenza Virus 4 NOT DETECTED NOT DETECTED Final   Respiratory Syncytial Virus NOT DETECTED NOT DETECTED Final   Bordetella pertussis NOT DETECTED NOT DETECTED Final   Chlamydophila pneumoniae NOT DETECTED NOT DETECTED Final   Mycoplasma pneumoniae NOT DETECTED NOT DETECTED Final     Time coordinating discharge: Over 30 minutes  SIGNED:   Hosie Poisson, MD  [facility] 01/01/2016, 2:04 PM Pager [phone]   If 7PM-7AM, please contact night-coverage [email or web address] Password [email or web address]

## 2016-01-03 ENCOUNTER — Emergency Department (HOSPITAL_COMMUNITY): Payer: Medicare Other

## 2016-01-03 ENCOUNTER — Encounter: Payer: Self-pay | Admitting: Internal Medicine

## 2016-01-03 ENCOUNTER — Inpatient Hospital Stay (HOSPITAL_COMMUNITY): Payer: Medicare Other

## 2016-01-03 ENCOUNTER — Encounter (HOSPITAL_COMMUNITY): Payer: Self-pay | Admitting: Emergency Medicine

## 2016-01-03 ENCOUNTER — Inpatient Hospital Stay (HOSPITAL_COMMUNITY)
Admission: EM | Admit: 2016-01-03 | Discharge: 2016-01-19 | DRG: 004 | Disposition: A | Discharge status: Other Acute Inpatient Hospital | Payer: Medicare Other | Attending: Internal Medicine | Admitting: Internal Medicine

## 2016-01-03 ENCOUNTER — Non-Acute Institutional Stay (SKILLED_NURSING_FACILITY): Payer: Medicare Other | Admitting: Internal Medicine

## 2016-01-03 DIAGNOSIS — K219 Gastro-esophageal reflux disease without esophagitis: Secondary | ICD-10-CM

## 2016-01-03 DIAGNOSIS — I480 Paroxysmal atrial fibrillation: Secondary | ICD-10-CM

## 2016-01-03 DIAGNOSIS — E870 Hyperosmolality and hypernatremia: Secondary | ICD-10-CM | POA: Diagnosis present

## 2016-01-03 DIAGNOSIS — N170 Acute kidney failure with tubular necrosis: Secondary | ICD-10-CM | POA: Diagnosis not present

## 2016-01-03 DIAGNOSIS — Z9911 Dependence on respirator [ventilator] status: Secondary | ICD-10-CM | POA: Diagnosis not present

## 2016-01-03 DIAGNOSIS — R0600 Dyspnea, unspecified: Secondary | ICD-10-CM

## 2016-01-03 DIAGNOSIS — G9341 Metabolic encephalopathy: Secondary | ICD-10-CM | POA: Diagnosis not present

## 2016-01-03 DIAGNOSIS — I5032 Chronic diastolic (congestive) heart failure: Secondary | ICD-10-CM | POA: Diagnosis not present

## 2016-01-03 DIAGNOSIS — J930 Spontaneous tension pneumothorax: Secondary | ICD-10-CM | POA: Diagnosis not present

## 2016-01-03 DIAGNOSIS — Z8042 Family history of malignant neoplasm of prostate: Secondary | ICD-10-CM

## 2016-01-03 DIAGNOSIS — H6191 Disorder of right external ear, unspecified: Secondary | ICD-10-CM | POA: Diagnosis not present

## 2016-01-03 DIAGNOSIS — J9621 Acute and chronic respiratory failure with hypoxia: Secondary | ICD-10-CM | POA: Diagnosis not present

## 2016-01-03 DIAGNOSIS — Z7952 Long term (current) use of systemic steroids: Secondary | ICD-10-CM

## 2016-01-03 DIAGNOSIS — E871 Hypo-osmolality and hyponatremia: Secondary | ICD-10-CM | POA: Diagnosis not present

## 2016-01-03 DIAGNOSIS — D72829 Elevated white blood cell count, unspecified: Secondary | ICD-10-CM

## 2016-01-03 DIAGNOSIS — Z794 Long term (current) use of insulin: Secondary | ICD-10-CM | POA: Diagnosis not present

## 2016-01-03 DIAGNOSIS — T45515A Adverse effect of anticoagulants, initial encounter: Secondary | ICD-10-CM | POA: Diagnosis present

## 2016-01-03 DIAGNOSIS — Z01818 Encounter for other preprocedural examination: Secondary | ICD-10-CM | POA: Diagnosis not present

## 2016-01-03 DIAGNOSIS — Y95 Nosocomial condition: Secondary | ICD-10-CM | POA: Diagnosis present

## 2016-01-03 DIAGNOSIS — K2971 Gastritis, unspecified, with bleeding: Secondary | ICD-10-CM | POA: Diagnosis not present

## 2016-01-03 DIAGNOSIS — Z6823 Body mass index (BMI) 23.0-23.9, adult: Secondary | ICD-10-CM

## 2016-01-03 DIAGNOSIS — L89159 Pressure ulcer of sacral region, unspecified stage: Secondary | ICD-10-CM | POA: Diagnosis present

## 2016-01-03 DIAGNOSIS — R1110 Vomiting, unspecified: Secondary | ICD-10-CM

## 2016-01-03 DIAGNOSIS — I252 Old myocardial infarction: Secondary | ICD-10-CM

## 2016-01-03 DIAGNOSIS — R0602 Shortness of breath: Secondary | ICD-10-CM | POA: Diagnosis present

## 2016-01-03 DIAGNOSIS — K567 Ileus, unspecified: Secondary | ICD-10-CM | POA: Diagnosis not present

## 2016-01-03 DIAGNOSIS — E1122 Type 2 diabetes mellitus with diabetic chronic kidney disease: Secondary | ICD-10-CM | POA: Diagnosis present

## 2016-01-03 DIAGNOSIS — Z4659 Encounter for fitting and adjustment of other gastrointestinal appliance and device: Secondary | ICD-10-CM

## 2016-01-03 DIAGNOSIS — I959 Hypotension, unspecified: Secondary | ICD-10-CM | POA: Diagnosis not present

## 2016-01-03 DIAGNOSIS — J181 Lobar pneumonia, unspecified organism: Secondary | ICD-10-CM

## 2016-01-03 DIAGNOSIS — B9562 Methicillin resistant Staphylococcus aureus infection as the cause of diseases classified elsewhere: Secondary | ICD-10-CM | POA: Diagnosis not present

## 2016-01-03 DIAGNOSIS — E785 Hyperlipidemia, unspecified: Secondary | ICD-10-CM | POA: Diagnosis present

## 2016-01-03 DIAGNOSIS — R578 Other shock: Secondary | ICD-10-CM | POA: Diagnosis not present

## 2016-01-03 DIAGNOSIS — Z955 Presence of coronary angioplasty implant and graft: Secondary | ICD-10-CM

## 2016-01-03 DIAGNOSIS — I130 Hypertensive heart and chronic kidney disease with heart failure and stage 1 through stage 4 chronic kidney disease, or unspecified chronic kidney disease: Secondary | ICD-10-CM | POA: Diagnosis present

## 2016-01-03 DIAGNOSIS — J80 Acute respiratory distress syndrome: Secondary | ICD-10-CM | POA: Diagnosis not present

## 2016-01-03 DIAGNOSIS — G9340 Encephalopathy, unspecified: Secondary | ICD-10-CM | POA: Diagnosis not present

## 2016-01-03 DIAGNOSIS — R109 Unspecified abdominal pain: Secondary | ICD-10-CM

## 2016-01-03 DIAGNOSIS — Z800 Family history of malignant neoplasm of digestive organs: Secondary | ICD-10-CM

## 2016-01-03 DIAGNOSIS — J95851 Ventilator associated pneumonia: Secondary | ICD-10-CM | POA: Diagnosis not present

## 2016-01-03 DIAGNOSIS — B3789 Other sites of candidiasis: Secondary | ICD-10-CM | POA: Diagnosis present

## 2016-01-03 DIAGNOSIS — E440 Moderate protein-calorie malnutrition: Secondary | ICD-10-CM | POA: Diagnosis not present

## 2016-01-03 DIAGNOSIS — N183 Chronic kidney disease, stage 3 unspecified: Secondary | ICD-10-CM

## 2016-01-03 DIAGNOSIS — R6521 Severe sepsis with septic shock: Secondary | ICD-10-CM | POA: Diagnosis not present

## 2016-01-03 DIAGNOSIS — D638 Anemia in other chronic diseases classified elsewhere: Secondary | ICD-10-CM | POA: Diagnosis not present

## 2016-01-03 DIAGNOSIS — Z8249 Family history of ischemic heart disease and other diseases of the circulatory system: Secondary | ICD-10-CM

## 2016-01-03 DIAGNOSIS — D689 Coagulation defect, unspecified: Secondary | ICD-10-CM | POA: Diagnosis not present

## 2016-01-03 DIAGNOSIS — E1165 Type 2 diabetes mellitus with hyperglycemia: Secondary | ICD-10-CM | POA: Diagnosis present

## 2016-01-03 DIAGNOSIS — Z7901 Long term (current) use of anticoagulants: Secondary | ICD-10-CM

## 2016-01-03 DIAGNOSIS — R509 Fever, unspecified: Secondary | ICD-10-CM

## 2016-01-03 DIAGNOSIS — R195 Other fecal abnormalities: Secondary | ICD-10-CM

## 2016-01-03 DIAGNOSIS — J9601 Acute respiratory failure with hypoxia: Secondary | ICD-10-CM | POA: Diagnosis present

## 2016-01-03 DIAGNOSIS — A4102 Sepsis due to Methicillin resistant Staphylococcus aureus: Secondary | ICD-10-CM | POA: Diagnosis not present

## 2016-01-03 DIAGNOSIS — K5900 Constipation, unspecified: Secondary | ICD-10-CM | POA: Diagnosis not present

## 2016-01-03 DIAGNOSIS — J189 Pneumonia, unspecified organism: Secondary | ICD-10-CM | POA: Diagnosis not present

## 2016-01-03 DIAGNOSIS — Z87891 Personal history of nicotine dependence: Secondary | ICD-10-CM

## 2016-01-03 DIAGNOSIS — N179 Acute kidney failure, unspecified: Secondary | ICD-10-CM | POA: Diagnosis not present

## 2016-01-03 DIAGNOSIS — J440 Chronic obstructive pulmonary disease with acute lower respiratory infection: Secondary | ICD-10-CM | POA: Diagnosis present

## 2016-01-03 DIAGNOSIS — A498 Other bacterial infections of unspecified site: Secondary | ICD-10-CM | POA: Diagnosis not present

## 2016-01-03 DIAGNOSIS — E872 Acidosis: Secondary | ICD-10-CM | POA: Diagnosis present

## 2016-01-03 DIAGNOSIS — M353 Polymyalgia rheumatica: Secondary | ICD-10-CM | POA: Diagnosis present

## 2016-01-03 DIAGNOSIS — G8929 Other chronic pain: Secondary | ICD-10-CM | POA: Diagnosis present

## 2016-01-03 DIAGNOSIS — A419 Sepsis, unspecified organism: Secondary | ICD-10-CM | POA: Diagnosis not present

## 2016-01-03 DIAGNOSIS — I471 Supraventricular tachycardia: Secondary | ICD-10-CM | POA: Diagnosis present

## 2016-01-03 DIAGNOSIS — J9600 Acute respiratory failure, unspecified whether with hypoxia or hypercapnia: Secondary | ICD-10-CM | POA: Diagnosis not present

## 2016-01-03 DIAGNOSIS — L89819 Pressure ulcer of head, unspecified stage: Secondary | ICD-10-CM | POA: Diagnosis present

## 2016-01-03 DIAGNOSIS — D696 Thrombocytopenia, unspecified: Secondary | ICD-10-CM | POA: Diagnosis present

## 2016-01-03 DIAGNOSIS — A4101 Sepsis due to Methicillin susceptible Staphylococcus aureus: Secondary | ICD-10-CM | POA: Diagnosis not present

## 2016-01-03 DIAGNOSIS — Z86010 Personal history of colonic polyps: Secondary | ICD-10-CM

## 2016-01-03 DIAGNOSIS — I2510 Atherosclerotic heart disease of native coronary artery without angina pectoris: Secondary | ICD-10-CM | POA: Diagnosis present

## 2016-01-03 DIAGNOSIS — T829XXA Unspecified complication of cardiac and vascular prosthetic device, implant and graft, initial encounter: Secondary | ICD-10-CM

## 2016-01-03 DIAGNOSIS — M069 Rheumatoid arthritis, unspecified: Secondary | ICD-10-CM | POA: Diagnosis not present

## 2016-01-03 DIAGNOSIS — R531 Weakness: Secondary | ICD-10-CM

## 2016-01-03 DIAGNOSIS — J939 Pneumothorax, unspecified: Secondary | ICD-10-CM | POA: Diagnosis not present

## 2016-01-03 DIAGNOSIS — R7881 Bacteremia: Secondary | ICD-10-CM | POA: Diagnosis not present

## 2016-01-03 DIAGNOSIS — Z0189 Encounter for other specified special examinations: Secondary | ICD-10-CM | POA: Diagnosis not present

## 2016-01-03 DIAGNOSIS — Z515 Encounter for palliative care: Secondary | ICD-10-CM | POA: Diagnosis not present

## 2016-01-03 DIAGNOSIS — R21 Rash and other nonspecific skin eruption: Secondary | ICD-10-CM | POA: Diagnosis present

## 2016-01-03 DIAGNOSIS — I509 Heart failure, unspecified: Secondary | ICD-10-CM | POA: Diagnosis not present

## 2016-01-03 DIAGNOSIS — J9620 Acute and chronic respiratory failure, unspecified whether with hypoxia or hypercapnia: Secondary | ICD-10-CM | POA: Diagnosis not present

## 2016-01-03 DIAGNOSIS — I4891 Unspecified atrial fibrillation: Secondary | ICD-10-CM | POA: Diagnosis not present

## 2016-01-03 DIAGNOSIS — L4050 Arthropathic psoriasis, unspecified: Secondary | ICD-10-CM | POA: Diagnosis present

## 2016-01-03 DIAGNOSIS — Z79899 Other long term (current) drug therapy: Secondary | ICD-10-CM

## 2016-01-03 DIAGNOSIS — I9589 Other hypotension: Secondary | ICD-10-CM | POA: Diagnosis not present

## 2016-01-03 DIAGNOSIS — E43 Unspecified severe protein-calorie malnutrition: Secondary | ICD-10-CM | POA: Diagnosis not present

## 2016-01-03 DIAGNOSIS — J9602 Acute respiratory failure with hypercapnia: Secondary | ICD-10-CM | POA: Diagnosis not present

## 2016-01-03 DIAGNOSIS — M05719 Rheumatoid arthritis with rheumatoid factor of unspecified shoulder without organ or systems involvement: Secondary | ICD-10-CM | POA: Diagnosis not present

## 2016-01-03 DIAGNOSIS — N189 Chronic kidney disease, unspecified: Secondary | ICD-10-CM | POA: Diagnosis not present

## 2016-01-03 DIAGNOSIS — J9690 Respiratory failure, unspecified, unspecified whether with hypoxia or hypercapnia: Secondary | ICD-10-CM

## 2016-01-03 LAB — URINALYSIS, ROUTINE W REFLEX MICROSCOPIC
Bilirubin Urine: NEGATIVE
GLUCOSE, UA: NEGATIVE mg/dL
Hgb urine dipstick: NEGATIVE
Ketones, ur: NEGATIVE mg/dL
LEUKOCYTES UA: NEGATIVE
Nitrite: NEGATIVE
PH: 5 (ref 5.0–8.0)
PROTEIN: NEGATIVE mg/dL
SPECIFIC GRAVITY, URINE: 1.018 (ref 1.005–1.030)

## 2016-01-03 LAB — BLOOD GAS, ARTERIAL
ACID-BASE DEFICIT: 3.7 mmol/L — AB (ref 0.0–2.0)
Acid-base deficit: 5.6 mmol/L — ABNORMAL HIGH (ref 0.0–2.0)
BICARBONATE: 21.1 meq/L (ref 20.0–24.0)
BICARBONATE: 19.2 meq/L — AB (ref 20.0–24.0)
Drawn by: 232811
Drawn by: 232811
FIO2: 1
FIO2: 0.7
LHR: 24 {breaths}/min
O2 SAT: 94.8 %
O2 Saturation: 97 %
PATIENT TEMPERATURE: 98.1
PCO2 ART: 39.2 mmHg (ref 35.0–45.0)
PEEP/CPAP: 8 cmH2O
PEEP: 10 cmH2O
PH ART: 7.349 — AB (ref 7.350–7.450)
Patient temperature: 97.7
RATE: 26 resp/min
TCO2: 19.8 mmol/L (ref 0–100)
TCO2: 18 mmol/L (ref 0–100)
VT: 550 mL
VT: 410 mL
pCO2 arterial: 36.6 mmHg (ref 35.0–45.0)
pH, Arterial: 7.337 — ABNORMAL LOW (ref 7.350–7.450)
pO2, Arterial: 112 mmHg — ABNORMAL HIGH (ref 80.0–100.0)
pO2, Arterial: 86.5 mmHg (ref 80.0–100.0)

## 2016-01-03 LAB — CBC WITH DIFFERENTIAL/PLATELET
Basophils Absolute: 0 10*3/uL (ref 0.0–0.1)
Basophils Relative: 0 %
EOS ABS: 0 10*3/uL (ref 0.0–0.7)
EOS PCT: 0 %
HCT: 35.8 % — ABNORMAL LOW (ref 39.0–52.0)
Hemoglobin: 11.4 g/dL — ABNORMAL LOW (ref 13.0–17.0)
LYMPHS ABS: 1.6 10*3/uL (ref 0.7–4.0)
Lymphocytes Relative: 14 %
MCH: 29.5 pg (ref 26.0–34.0)
MCHC: 31.8 g/dL (ref 30.0–36.0)
MCV: 92.5 fL (ref 78.0–100.0)
MONO ABS: 1 10*3/uL (ref 0.1–1.0)
MONOS PCT: 8 %
Neutro Abs: 9.2 10*3/uL — ABNORMAL HIGH (ref 1.7–7.7)
Neutrophils Relative %: 77 %
PLATELETS: 301 10*3/uL (ref 150–400)
RBC: 3.87 MIL/uL — ABNORMAL LOW (ref 4.22–5.81)
RDW: 17.9 % — AB (ref 11.5–15.5)
WBC: 11.6 10*3/uL — ABNORMAL HIGH (ref 4.0–10.5)

## 2016-01-03 LAB — MAGNESIUM: MAGNESIUM: 1.5 mg/dL — AB (ref 1.7–2.4)

## 2016-01-03 LAB — COMPREHENSIVE METABOLIC PANEL
ALK PHOS: 127 U/L — AB (ref 38–126)
ALT: 40 U/L (ref 17–63)
AST: 56 U/L — AB (ref 15–41)
Albumin: 2.1 g/dL — ABNORMAL LOW (ref 3.5–5.0)
Anion gap: 8 (ref 5–15)
BUN: 37 mg/dL — AB (ref 6–20)
CALCIUM: 9.3 mg/dL (ref 8.9–10.3)
CHLORIDE: 101 mmol/L (ref 101–111)
CO2: 22 mmol/L (ref 22–32)
CREATININE: 1.97 mg/dL — AB (ref 0.61–1.24)
GFR calc Af Amer: 36 mL/min — ABNORMAL LOW (ref >60)
GFR calc non Af Amer: 31 mL/min — ABNORMAL LOW (ref >60)
Glucose, Bld: 138 mg/dL — ABNORMAL HIGH (ref 65–99)
Potassium: 4.5 mmol/L (ref 3.5–5.1)
SODIUM: 131 mmol/L — AB (ref 135–145)
Total Bilirubin: 0.8 mg/dL (ref 0.3–1.2)
Total Protein: 7.1 g/dL (ref 6.5–8.1)

## 2016-01-03 LAB — PHOSPHORUS: Phosphorus: 5.1 mg/dL — ABNORMAL HIGH (ref 2.5–4.6)

## 2016-01-03 LAB — I-STAT CG4 LACTIC ACID, ED: LACTIC ACID, VENOUS: 1.19 mmol/L (ref 0.5–1.9)

## 2016-01-03 LAB — MRSA PCR SCREENING: MRSA by PCR: NEGATIVE

## 2016-01-03 LAB — PROTIME-INR
INR: 2.56 — ABNORMAL HIGH (ref 0.00–1.49)
PROTHROMBIN TIME: 27.2 s — AB (ref 11.6–15.2)

## 2016-01-03 LAB — TROPONIN I: Troponin I: 0.13 ng/mL (ref <0.03)

## 2016-01-03 LAB — C-REACTIVE PROTEIN: CRP: 19.7 mg/dL — ABNORMAL HIGH (ref <1.0)

## 2016-01-03 LAB — BRAIN NATRIURETIC PEPTIDE: B Natriuretic Peptide: 276.7 pg/mL — ABNORMAL HIGH (ref 0.0–100.0)

## 2016-01-03 LAB — SEDIMENTATION RATE: Sed Rate: 80 mm/hr — ABNORMAL HIGH (ref 0–16)

## 2016-01-03 LAB — GLUCOSE, CAPILLARY: GLUCOSE-CAPILLARY: 228 mg/dL — AB (ref 65–99)

## 2016-01-03 MED ORDER — SODIUM CHLORIDE 0.9 % IV SOLN
INTRAVENOUS | Status: DC
  Administered 2016-01-03 – 2016-01-08 (×6): via INTRAVENOUS
  Administered 2016-01-09: 75 mL/h via INTRAVENOUS
  Administered 2016-01-09 – 2016-01-10 (×3): via INTRAVENOUS

## 2016-01-03 MED ORDER — FENTANYL BOLUS VIA INFUSION
25.0000 ug | INTRAVENOUS | Status: DC | PRN
Start: 2016-01-03 — End: 2016-01-04
  Filled 2016-01-03: qty 25

## 2016-01-03 MED ORDER — CHLORHEXIDINE GLUCONATE 0.12% ORAL RINSE (MEDLINE KIT)
15.0000 mL | Freq: Two times a day (BID) | OROMUCOSAL | Status: DC
  Administered 2016-01-04 – 2016-01-07 (×8): 15 mL via OROMUCOSAL

## 2016-01-03 MED ORDER — FAMOTIDINE IN NACL 20-0.9 MG/50ML-% IV SOLN: 20.0000 mg | Freq: Two times a day (BID) | INTRAVENOUS | Status: DC

## 2016-01-03 MED ORDER — IPRATROPIUM-ALBUTEROL 0.5-2.5 (3) MG/3ML IN SOLN
3.0000 mL | Freq: Four times a day (QID) | RESPIRATORY_TRACT | Status: DC
  Administered 2016-01-03 – 2016-01-17 (×54): 3 mL via RESPIRATORY_TRACT
  Filled 2016-01-03 (×55): qty 3

## 2016-01-03 MED ORDER — VANCOMYCIN HCL IN DEXTROSE 1-5 GM/200ML-% IV SOLN: 1000.0000 mg | INTRAVENOUS | Status: DC

## 2016-01-03 MED ORDER — ETOMIDATE 2 MG/ML IV SOLN
INTRAVENOUS | Status: AC | PRN
  Administered 2016-01-03: 20 mg via INTRAVENOUS

## 2016-01-03 MED ORDER — INSULIN ASPART 100 UNIT/ML ~~LOC~~ SOLN
2.0000 [IU] | SUBCUTANEOUS | Status: DC
  Administered 2016-01-04: 6 [IU] via SUBCUTANEOUS

## 2016-01-03 MED ORDER — HEPARIN SODIUM (PORCINE) 5000 UNIT/ML IJ SOLN: 5000.0000 [IU] | Freq: Three times a day (TID) | INTRAMUSCULAR | Status: DC

## 2016-01-03 MED ORDER — VITAL HIGH PROTEIN PO LIQD
1000.0000 mL | ORAL | Status: DC
  Administered 2016-01-04: 1000 mL
  Filled 2016-01-03: qty 1000

## 2016-01-03 MED ORDER — ANTISEPTIC ORAL RINSE SOLUTION (CORINZ)
7.0000 mL | Freq: Four times a day (QID) | OROMUCOSAL | Status: DC
  Administered 2016-01-04 – 2016-01-07 (×14): 7 mL via OROMUCOSAL

## 2016-01-03 MED ORDER — FENTANYL CITRATE (PF) 100 MCG/2ML IJ SOLN
INTRAMUSCULAR | Status: AC
  Administered 2016-01-03: 100 ug
  Filled 2016-01-03: qty 2

## 2016-01-03 MED ORDER — MIDAZOLAM HCL 2 MG/2ML IJ SOLN
INTRAMUSCULAR | Status: AC
  Administered 2016-01-03: 2 mg
  Filled 2016-01-03: qty 4

## 2016-01-03 MED ORDER — SODIUM CHLORIDE 0.9 % IV SOLN
250.0000 mL | INTRAVENOUS | Status: DC | PRN
  Administered 2016-01-03: 250 mL via INTRAVENOUS

## 2016-01-03 MED ORDER — PHENYLEPHRINE HCL 10 MG/ML IJ SOLN
30.0000 ug/min | INTRAMUSCULAR | Status: DC
  Administered 2016-01-03: 30 ug/min via INTRAVENOUS
  Administered 2016-01-04: 50 ug/min via INTRAVENOUS
  Administered 2016-01-04 (×2): 90 ug/min via INTRAVENOUS
  Administered 2016-01-04: 50 ug/min via INTRAVENOUS
  Administered 2016-01-04: 24 ug/min via INTRAVENOUS
  Filled 2016-01-03 (×6): qty 1

## 2016-01-03 MED ORDER — METHYLPREDNISOLONE SODIUM SUCC 40 MG IJ SOLR
40.0000 mg | Freq: Two times a day (BID) | INTRAMUSCULAR | Status: DC
  Administered 2016-01-03 – 2016-01-10 (×14): 40 mg via INTRAVENOUS
  Filled 2016-01-03 (×14): qty 1

## 2016-01-03 MED ORDER — FAMOTIDINE IN NACL 20-0.9 MG/50ML-% IV SOLN
20.0000 mg | INTRAVENOUS | Status: DC
Start: 2016-01-03 — End: 2016-01-10
  Administered 2016-01-04 – 2016-01-09 (×7): 20 mg via INTRAVENOUS
  Filled 2016-01-03 (×7): qty 50

## 2016-01-03 MED ORDER — PRO-STAT SUGAR FREE PO LIQD
30.0000 mL | Freq: Two times a day (BID) | ORAL | Status: DC
  Administered 2016-01-04 (×2): 30 mL
  Filled 2016-01-03 (×2): qty 30

## 2016-01-03 MED ORDER — SODIUM CHLORIDE 0.9 % IV SOLN
500.0000 mg | Freq: Three times a day (TID) | INTRAVENOUS | Status: DC
  Administered 2016-01-04: 500 mg via INTRAVENOUS
  Filled 2016-01-03 (×2): qty 500

## 2016-01-03 MED ORDER — FENTANYL CITRATE (PF) 100 MCG/2ML IJ SOLN
50.0000 ug | Freq: Once | INTRAMUSCULAR | Status: AC
  Administered 2016-01-03: 50 ug via INTRAVENOUS
  Filled 2016-01-03: qty 2

## 2016-01-03 MED ORDER — ALBUTEROL SULFATE (2.5 MG/3ML) 0.083% IN NEBU
2.5000 mg | INHALATION_SOLUTION | RESPIRATORY_TRACT | Status: DC | PRN
  Administered 2016-01-15: 2.5 mg via RESPIRATORY_TRACT

## 2016-01-03 MED ORDER — FENTANYL CITRATE (PF) 100 MCG/2ML IJ SOLN
INTRAMUSCULAR | Status: AC
  Filled 2016-01-03: qty 2

## 2016-01-03 MED ORDER — SODIUM CHLORIDE 0.9 % IV SOLN
500.0000 mg | INTRAVENOUS | Status: AC
  Administered 2016-01-03: 500 mg via INTRAVENOUS
  Filled 2016-01-03: qty 500

## 2016-01-03 MED ORDER — SODIUM CHLORIDE 0.9 % IV SOLN
INTRAVENOUS | Status: AC | PRN
Start: 2016-01-03 — End: 2016-01-03
  Administered 2016-01-03: 1000 mL via INTRAVENOUS

## 2016-01-03 MED ORDER — VANCOMYCIN HCL IN DEXTROSE 1-5 GM/200ML-% IV SOLN
1000.0000 mg | Freq: Once | INTRAVENOUS | Status: AC
  Administered 2016-01-03: 1000 mg via INTRAVENOUS
  Filled 2016-01-03: qty 200

## 2016-01-03 MED ORDER — SODIUM CHLORIDE 0.9 % IV SOLN
25.0000 ug/h | INTRAVENOUS | Status: DC
  Administered 2016-01-03: 25 ug/h via INTRAVENOUS
  Administered 2016-01-04: 300 ug/h via INTRAVENOUS
  Filled 2016-01-03 (×3): qty 50

## 2016-01-03 NOTE — Progress Notes (Signed)
Bedside Bronchoscopy performed. Saline washing performed.

## 2016-01-03 NOTE — Progress Notes (Signed)
Pharmacy Antibiotic Note  Curtis Frazier is a 75 y.o. male with PMHx CAD s/p stent, PAF on coumadin s/p recent ablation, CKD-III, DM, diastolic CHF, and RA on chronic prednisone with recent hospitalization for PNA and discharged 7/22,  presents 7/24 from SNF for AMS.  Pharmacy has been consulted for Vancomycin and Primaxin dosing.  Bronch performed showing diffuse, thick white creamy pus throughout.  CT shows interlobular septal thickening with bilateral ground-glass attenuation with features compatible with pulmonary edema although superimposed infectious / inflammatory etiology not excluded.   SCr elevated 1.97, CrCl ~31 ml/min CG (N 32)  Plan: Vancomycin 1g x 1 given in ED, then 1g IV q24h Primaxin 500mg  IV q8h F/u renal function, VT at Css, cultures, clinical course    Temp (24hrs), Avg:98.3 F (36.8 C), Min:97.6 F (36.4 C), Max:98.9 F (37.2 C)   Recent Labs Lab 12/28/15 0738 12/29/15 0420  WBC 13.3* 12.7*  CREATININE  --  1.60*    Estimated Creatinine Clearance: 38 mL/min (by C-G formula based on SCr of 1.6 mg/dL).    Allergies  Allergen Reactions  . Ace Inhibitors Other (See Comments)    Causes high K   . Angiotensin Receptor Blockers Other (See Comments)    Causes high K   . Metoprolol Other (See Comments)    May cause elevated K level   . Penicillins Swelling and Rash    Has patient had a PCN reaction causing immediate rash, facial/tongue/throat swelling, SOB or lightheadedness with hypotension: YES Has patient had a PCN reaction causing severe rash involving mucus membranes or skin necrosis: NO Has patient had a PCN reaction that required hospitalization NO Has patient had a PCN reaction occurring within the last 10 years: NO If all of the above answers are "NO", then may proceed with Cephalosporin use. Pt has tolerated cephalosporins in the past   . Amiodarone Other (See Comments)    12/27/15: Cardiologist discontinued the amiodarone (on PTA) due to  concern  for toxicity  (in hospitial with acute hypoxemic respiratory failure)   . Tetracycline Swelling and Rash    Antimicrobials this admission: 7/24 Vancomycin >>  7/24 Primaxin >>   Dose adjustments this admission:   Microbiology results: 7/24 BCx: collected  Thank you for allowing pharmacy to be a part of this patient's care.  Ralene Bathe, PharmD, BCPS 01/03/2016, 8:08 PM  Pager: [phone]

## 2016-01-03 NOTE — Procedures (Signed)
Intubation Procedure Note Curtis Frazier [id number] 03/21/1941  Procedure: Intubation Indications: Respiratory insufficiency  Procedure Details Consent: Risks of procedure as well as the alternatives and risks of each were explained to the (patient/caregiver).  Consent for procedure obtained. Time Out: Verified patient identification, verified procedure, site/side was marked, verified correct patient position, special equipment/implants available, medications/allergies/relevent history reviewed, required imaging and test results available.  Performed  Maximum sterile technique was used including gloves, hand hygiene and mask.  MAC and 4 Glide Grade 1 airway view  Medications: 164mcg fentanyl 2mg  versed 20mg  etomidate  8.5 tube passed under glidescoped visualization without difficulty.    Evaluation Hemodynamic Status: Transient hypotension treated with fluid; O2 sats: stable throughout Patient's Current Condition: stable Complications: No apparent complications Patient did tolerate procedure well. Chest X-ray ordered to verify placement.  CXR: pending.  Georgann Housekeeper, AGACNP-BC [facility] Pager [phone] or [phone]  01/03/2016 7:51 PM      I performed this intubation in full with Eddie Dibbles Used glide No trauma or asp during procedure  [name]. [name], MD, [place] Pgr: [facility]

## 2016-01-03 NOTE — ED Provider Notes (Signed)
[facility] DEPT Provider Note   CSN: [id number] Arrival date & time: 01/03/16  1747  First Provider Contact:  None       History   Chief Complaint Chief Complaint  Patient presents with  . Shortness of Breath  . Fatigue    HPI Curtis Frazier is a 75 y.o. male.  The history is provided by the patient. No language interpreter was used.  Shortness of Breath  This is a recurrent problem. The current episode started 3 to 5 hours ago. The problem occurs constantly. The problem has not changed since onset.Pertinent negatives include no chest pain and no shortness of breath. Nothing aggravates the symptoms. Nothing relieves the symptoms. He has tried nothing for the symptoms. The treatment provided no relief.    Past Medical History:  Diagnosis Date  . Actinic keratosis   . Arthritis    "hips" (12/15/2015)  . Bruises easily   . CAD (coronary artery disease)    a. 2010 s/p MI/Cath/PCI ([facility]): LM 10, LAD 30p, 74m D1 20, D2 30, LCX 30p, 962m3.5 x 15 Driver BMS), [id number] 70p, 3098m. 04/2011 Neg MV;  c. 04/2015 Myoview: very mild apical thinning, no ischemia, EF 65%.  . Carotid arterial disease ([place]CC[place]  a. 10/2015 Carotid US:[place] 1-39% heterogeneous plaque/stenosis. Nl subclavian arteries bilat.  . Chronic back pain    scoliosis/stenosis/radiculopathy,degenerative disc disease  . Chronic diastolic CHF (congestive heart failure) (HCCH[place]y  a. 08/2015 Echo: EF 55-60%, mild LVH, no rwma, Gr1 DD, mod AS, mildly dil LA, PASP 42m76m  . CKD (chronic kidney disease), stage III    a. 09/2015 AKI in setting of dehydration - seen @ [facility].  . COMarland KitchenD (chronic obstructive pulmonary disease) (HCC)[place] 12/10/2015  . Degeneration of intervertebral disc, site unspecified   . Dyspnea on exertion   . Esophageal reflux    takes Omeprazole daily  . Gastritis 12/10  . GI bleed    a. 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  . Hemorrhoids   . History of blood transfusion 2010   "don't remember why"  .  History of colonic polyps   . Hyperlipidemia    a. takes pravachol.  . Hypertensive heart disease    a. takes Metoprolol daily  . Joint pain   . Lumbar stenosis    a. s/p lumbar diskectomy 2017.  . Moderate aortic stenosis    a. 08/2015 Mod AS, valve area (VTI) - 1.5cm^2, (Vmax) - 1.25cm^2; b. 10/2015 TEE EF 55-60%, mod AS.  . NeMarland Kitchenhrolithiasis   . Other psoriasis   . Paroxysmal atrial fibrillation (HCC)[place] a. 06/2011 Post-op AF-->converted on amio;  b. 07/2015 TEE/unsuccessful DCCV x 2; c. 09/2015 recurrent AF/Flutter-->successful DCCV; d. 09/2015 Recurrent AF-->10/2015 s/p TEE (EF 55-60%, mod AS, mild MR, sev dil LA w/o LAA thrombus, sev dil RA, mild to mod TR) & DCCV (120 J x 1).  . Personal history of colonic polyps   . PMR (polymyalgia rheumatica) (HCC) 01/20/2012   tx by specialist on a long prednisone taper  . Pneumonia 2008; 07/2015  . Psoriasis   . Psoriatic arthropathy (HCC)[place]. Rheumatoid arthritis(714.0)    takes Methotrexate 7pills weekly  . Scoliosis (and kyphoscoliosis), idiopathic   . Skin cancer    "arms; they freeze then off; cut one off left elbow recently" (12/15/2015)  . Sleep apnea    "had it while in hospital in 07/2015" (12/15/2015)  . STEMI (ST elevation myocardial  infarction) ([place]) 02/2010   Archie Endo 05/2009  . Type II diabetes mellitus [facility])     Patient Active Problem List   Diagnosis Date Noted  . Acute respiratory failure with hypoxemia ([place]) 01/03/2016  . Visit for intubation   . Complex care coordination   . Palliative care by specialist   . Advance care planning   . Goals of care, counseling/discussion   . Acute respiratory failure with hypoxia ([place]) 12/25/2015  . HCAP (healthcare-associated pneumonia) 12/23/2015  . Hyponatremia 12/23/2015  . GERD (gastroesophageal reflux disease) 12/23/2015  . Healthcare-associated pneumonia   . Hypoxia 12/22/2015  . Typical atrial flutter ([place])   . Productive cough 11/10/2015  . Hypertensive heart disease   . CKD  (chronic kidney disease), stage III   . Moderate aortic stenosis   . Chronic diastolic CHF (congestive heart failure) ([place])   . CAD (coronary artery disease)   . Hyperlipidemia   . Persistent atrial fibrillation ([place])   . Fever 10/11/2015  . Dystrophic nail 10/08/2015  . Abnormal finding on thyroid function test 09/24/2015  . Acute on chronic diastolic heart failure ([place]) 09/24/2015  . Injury of kidney 09/24/2015  . At risk for infection 09/24/2015  . Type 2 diabetes mellitus ([place]) 09/24/2015  . Pain in the chest   . Acute on chronic diastolic HF (heart failure) ([place])   . Atrial fibrillation with RVR ([place])   . Atrial flutter ([place])   . Chest pain on exertion 09/09/2015  . Elevated troponin 09/09/2015  . Kidney disease, chronic, stage III (GFR 30-59 ml/min) 09/09/2015  . Anemia of chronic disease 09/09/2015  . Anemia in chronic illness 09/09/2015  . Chest pain 09/09/2015  . Chronic kidney disease (CKD), stage III (moderate) 09/09/2015  . Acute posthemorrhagic anemia   . Abnormal thyroid function test   . Arterial hypotension   . Paroxysmal atrial fibrillation (HCC)   . Diabetes mellitus with complication ([place])   . Symptomatic cholelithiasis 09/07/2015  . Calculus of gallbladder 09/07/2015  . Spondylosis of lumbar region without myelopathy or radiculopathy   . Urinary retention 08/12/2015  . Bladder retention 08/12/2015  . Chronic diastolic congestive heart failure ([place]) 08/10/2015  . Chronic diastolic heart failure ([place]) 08/10/2015  . Back pain without radiation   . Counseling regarding advanced care planning and goals of care   . S/P lumbar laminectomy 07/17/2015  . Postprocedural state 07/17/2015  . Back pain, thoracic 01/15/2015  . Chest wall pain 01/15/2015  . Encounter for Medicare annual wellness exam 07/07/2013  . BPH (benign prostatic hyperplasia) 07/05/2012  . Enlarged prostate 07/05/2012  . Prostate cancer screening 06/27/2012  . Low TSH level 01/22/2012  . PMR  (polymyalgia rheumatica) (HCC) 01/20/2012  . Polymyalgia rheumatica ([place]) 01/20/2012  . HYPERKALEMIA 02/18/2010  . ANGIODYSPLASIA-INTESTINE 06/10/2009  . Angiodysplasia of colon 06/10/2009  . MURMUR 05/21/2009  . Left carotid bruit 05/21/2009  . Cardiac murmur 05/21/2009  . Coronary atherosclerosis 03/09/2009  . CAD in native artery 03/09/2009  . Rheumatoid arthritis ([place]) 04/29/2008  . HX, PERSONAL, COLONIC POLYPS 01/17/2007  . History of colon polyps 01/17/2007  . PSORIATIC ARTHRITIS 09/18/2006  . PSORIASIS 09/18/2006  . 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 DEGENERATIVE DISC DISEASE 09/18/2006  . SCOLIOSIS 09/18/2006  . TOBACCO ABUSE, HX OF 09/18/2006  . Psoriasis with arthropathy ([place]) 09/18/2006  . Narrowing of intervertebral disc space 09/18/2006  . Idiopathic scoliosis 09/18/2006  . Nicotine addiction 09/18/2006    Past Surgical History:  Procedure Laterality Date  . BACK SURGERY    .  CARDIAC CATHETERIZATION  2010  . CARDIOVERSION N/A 08/06/2015   Procedure: CARDIOVERSION;  Surgeon: Skeet Latch, MD;  Location: [facility];  Service: Cardiovascular;  Laterality: N/A;  . CARDIOVERSION N/A 09/14/2015   Procedure: CARDIOVERSION;  Surgeon: Jerline Pain, MD;  Location: [facility];  Service: Cardiovascular;  Laterality: N/A;  . CARDIOVERSION N/A 11/04/2015   Procedure: CARDIOVERSION;  Surgeon: Sanda Klein, MD;  Location: [facility];  Service: Cardiovascular;  Laterality: N/A;  . carotid doppler  10/12   0-39% R and 60-79% left   . CATARACT EXTRACTION W/ INTRAOCULAR LENS  IMPLANT, BILATERAL Bilateral 2012  . COLONOSCOPY    . CORONARY ANGIOPLASTY WITH STENT PLACEMENT  02/2009   has one stent  . ELECTROPHYSIOLOGIC STUDY N/A 12/15/2015   Procedure: A-Flutter;  Surgeon: Will Meredith Leeds, MD;  Location: [facility];  Service: Cardiovascular;  Laterality: N/A;  . LAMINECTOMY THORACIC FUSION POSTERIOR  06/2011   Decompressive laminectomy at L12, L2-3, L4-5; posterior  segmental agitation T10-L5 with globus titanium pedicle screws and rods; T10-11, and T11-12, T12-L1, L1-2, L2-3, L3-4, L4-5 posterior lateral arthrodesis with local morselized autograft bone, bone morphogenic protein, Actifuse bone graft extender./notes 07/03/2011  . LITHOTRIPSY  2009  . LUMBAR LAMINECTOMY  07/2015  . POSTERIOR LAMINECTOMY THORACIC SPINE     "fixed the discs"  . SHOULDER OPEN ROTATOR CUFF REPAIR Left 2000  . SKIN CANCER EXCISION     "arms"  . TEE WITHOUT CARDIOVERSION N/A 08/06/2015   Procedure: TRANSESOPHAGEAL ECHOCARDIOGRAM (TEE);  Surgeon: Skeet Latch, MD;  Location: [facility];  Service: Cardiovascular;  Laterality: N/A;  . TEE WITHOUT CARDIOVERSION N/A 09/14/2015   Procedure: TRANSESOPHAGEAL ECHOCARDIOGRAM (TEE);  Surgeon: Jerline Pain, MD;  Location: [facility];  Service: Cardiovascular;  Laterality: N/A;  . TEE WITHOUT CARDIOVERSION N/A 11/04/2015   Procedure: TRANSESOPHAGEAL ECHOCARDIOGRAM (TEE);  Surgeon: Sanda Klein, MD;  Location[facility] ENDOSCOPY;  Service: Cardiovascular;  Laterality: N/A;  . [place] Medications    Prior to Admission medications   Medication Sig Start Date End Date Taking? Authorizing Provider  acetaminophen (TYLENOL) 325 MG tablet Take 650 mg by mouth every 6 (six) hours as needed (Pain scale of 1-4 or temp > 101).   Yes Historical Provider, MD  albuterol (PROVENTIL HFA;VENTOLIN HFA) 108 (90 Base) MCG/ACT inhaler Inhale 2 puffs into the lungs every 6 (six) hours as needed for wheezing or shortness of breath. 11/10/15  Yes Abner Greenspan, MD  albuterol (PROVENTIL) (2.5 MG/3ML) 0.083% nebulizer solution Take 3 mLs (2.5 mg total) by nebulization every 4 (four) hours as needed for wheezing or shortness of breath. 01/01/16  Yes Hosie Poisson, MD  calcium-vitamin D (OSCAL WITH D) 500-200 MG-UNIT tablet Take 1 tablet by mouth daily with breakfast.   Yes Historical Provider, MD  diltiazem (CARDIZEM CD) 240 MG 24 hr capsule TAKE 1  CAPSULE BY MOUTH DAILY 10/14/15  Yes Abner Greenspan, MD  docusate sodium (COLACE) 100 MG capsule Take 100 mg by mouth 2 (two) times daily. Reported on 09/22/2015   Yes Historical Provider, MD  ferrous sulfate 325 (65 FE) MG tablet Take 1 tablet (325 mg total) by mouth 3 (three) times daily with meals. 10/26/15  Yes Marne A Tower, MD  glucose blood (ONE TOUCH ULTRA TEST) test strip Check blood sugar three times daily and as directed. Dx E11.8 diabetes that is poorly controlled 11/15/15  Yes Abner Greenspan, MD  insulin aspart (NOVOLOG) 100 UNIT/ML injection  CBG 70 - 120: 0 units CBG 121 - 150: 2 units CBG 151 - 200: 3 units CBG 201 - 250: 5 units CBG 251 - 300: 8 units CBG 301 - 350: 11 units CBG 351 - 400: 15 units Patient taking differently: Inject 0-15 Units into the skin 4 (four) times daily as needed for high blood sugar (sliding scale). CBG 70 - 120: 0 units CBG 121 - 150: 2 units CBG 151 - 200: 3 units CBG 201 - 250: 5 units CBG 251 - 300: 8 units CBG 301 - 350: 11 units CBG 351 - 400: 15 units 01/01/16  Yes Hosie Poisson, MD  insulin detemir (LEVEMIR) 100 UNIT/ML injection Inject 0.15 mLs (15 Units total) into the skin daily. 01/01/16  Yes Hosie Poisson, MD  pantoprazole (PROTONIX) 40 MG tablet Take 1 tablet (40 mg total) by mouth daily. 10/11/15  Yes Tonia Ghent, MD  pravastatin (PRAVACHOL) 20 MG tablet Take 1 tablet (20 mg total) by mouth every evening. 11/10/15  Yes Rogelia Mire, NP  predniSONE (DELTASONE) 5 MG tablet Take 1 tablet (5 mg total) by mouth daily with breakfast. 12/03/15  Yes Abner Greenspan, MD  thiamine 100 MG tablet Take 1 tablet (100 mg total) by mouth daily. 10/11/15  Yes Tonia Ghent, MD  tiotropium (SPIRIVA) 18 MCG inhalation capsule Place 1 capsule (18 mcg total) into inhaler and inhale daily. 12/07/15  Yes Jearld Fenton, NP  warfarin (COUMADIN) 2.5 MG tablet Take 2.5 mg by mouth daily. Give every Tuesday, Thursday, Saturday and Sunday   Yes Historical Provider, MD    warfarin (COUMADIN) 5 MG tablet Take 5 mg by mouth daily. Give Monday, Wednesday and Friday   Yes Historical Provider, MD    Family History Family History  Problem Relation Age of Onset  . Coronary artery disease Mother   . Diabetes Mother   . CAD Mother   . Coronary artery disease Sister   . Hypertension Sister   . Kidney failure Sister   . CAD Sister   . Diabetes Brother   . Prostate cancer Brother   . Pancreatic cancer Brother   . Diabetes Brother   . Anesthesia problems Neg Hx   . Hypotension Neg Hx   . Malignant hyperthermia Neg Hx   . Pseudochol deficiency Neg Hx     Social History Social History  Substance Use Topics  . Smoking status: Former Smoker    Packs/day: 1.50    Years: 53.00    Types: Cigarettes    Quit date: 11/22/2008  . Smokeless tobacco: Never Used  . Alcohol use 0.0 oz/week     Comment: 7/5//2017 "might have 1-2 margaritas in the summertime"     Allergies   Ace inhibitors; Angiotensin receptor blockers; Metoprolol; Penicillins; Amiodarone; and Tetracycline   Review of Systems Review of Systems  Constitutional: Negative for chills and fever.  Eyes: Negative for pain and redness.  Respiratory: Negative for cough and shortness of breath.   Cardiovascular: Negative for chest pain and palpitations.  Endocrine: Negative for polydipsia and polyuria.  All other systems reviewed and are negative.    Physical Exam Updated Vital Signs BP (!) 103/53   Pulse 81   Temp 98.4 F (36.9 C) (Axillary)   Resp 17   Ht 5' 8"  (1.727 m)   Wt 156 lb 12 oz (71.1 kg)   SpO2 98%   BMI 23.83 kg/m   Physical Exam  Constitutional: He appears well-developed and well-nourished.  HENT:  Head: Normocephalic and atraumatic.  Neck: Normal range of motion.  Cardiovascular: Normal rate.   Pulmonary/Chest: He is in respiratory distress. He has rales.  Abdominal: Soft. He exhibits no distension.  Musculoskeletal: Normal range of motion. He exhibits no deformity.   Neurological: He is alert. No cranial nerve deficit. Coordination normal.  Skin: Skin is warm and dry.  Nursing note and vitals reviewed.    ED Treatments / Results  Labs (all labs ordered are listed, but only abnormal results are displayed) Labs Reviewed  COMPREHENSIVE METABOLIC PANEL - Abnormal; Notable for the following:       Result Value   Sodium 131 (*)    Glucose, Bld 138 (*)    BUN 37 (*)    Creatinine, Ser 1.97 (*)    Albumin 2.1 (*)    AST 56 (*)    Alkaline Phosphatase 127 (*)    GFR calc non Af Amer 31 (*)    GFR calc Af Amer 36 (*)    All other components within normal limits  CBC WITH DIFFERENTIAL/PLATELET - Abnormal; Notable for the following:    WBC 11.6 (*)    RBC 3.87 (*)    Hemoglobin 11.4 (*)    HCT 35.8 (*)    RDW 17.9 (*)    Neutro Abs 9.2 (*)    All other components within normal limits  URINALYSIS, ROUTINE W REFLEX MICROSCOPIC (NOT AT [facility]) - Abnormal; Notable for the following:    APPearance CLOUDY (*)    All other components within normal limits  BRAIN NATRIURETIC PEPTIDE - Abnormal; Notable for the following:    B Natriuretic Peptide 276.7 (*)    All other components within normal limits  TROPONIN I - Abnormal; Notable for the following:    Troponin I 0.13 (*)    All other components within normal limits  BLOOD GAS, ARTERIAL - Abnormal; Notable for the following:    pH, Arterial 7.349 (*)    pO2, Arterial 112 (*)    Acid-base deficit 3.7 (*)    All other components within normal limits  SEDIMENTATION RATE - Abnormal; Notable for the following:    Sed Rate 80 (*)    All other components within normal limits  C-REACTIVE PROTEIN - Abnormal; Notable for the following:    CRP 19.7 (*)    All other components within normal limits  MAGNESIUM - Abnormal; Notable for the following:    Magnesium 1.5 (*)    All other components within normal limits  PHOSPHORUS - Abnormal; Notable for the following:    Phosphorus 5.1 (*)    All other components  within normal limits  PROTIME-INR - Abnormal; Notable for the following:    Prothrombin Time 27.2 (*)    INR 2.56 (*)    All other components within normal limits  BLOOD GAS, ARTERIAL - Abnormal; Notable for the following:    pH, Arterial 7.337 (*)    Bicarbonate 19.2 (*)    Acid-base deficit 5.6 (*)    All other components within normal limits  GLUCOSE, CAPILLARY - Abnormal; Notable for the following:    Glucose-Capillary 228 (*)    All other components within normal limits  CULTURE, BLOOD (ROUTINE X 2)  CULTURE, BLOOD (ROUTINE X 2)  CULTURE, RESPIRATORY (NON-EXPECTORATED)  MRSA PCR SCREENING  CULTURE, BLOOD (ROUTINE X 2)  URINE CULTURE  CULTURE, BLOOD (ROUTINE X 2)  PNEUMOCYSTIS JIROVECI SMEAR BY DFA  GRAM STAIN  RESPIRATORY PANEL BY PCR  CBC  CREATININE, SERUM  CBC  BASIC METABOLIC PANEL  MAGNESIUM  PHOSPHORUS  BLOOD GAS, ARTERIAL  BODY FLUID CELL COUNT WITH DIFFERENTIAL  RHEUMATOID FACTORS, FLUID  C3 COMPLEMENT  C4 COMPLEMENT  ANCA TITERS  PROTIME-INR  MAGNESIUM  PHOSPHORUS  I-STAT CG4 LACTIC ACID, ED  I-STAT CG4 LACTIC ACID, ED    EKG  EKG Interpretation None       Radiology Dg Abd 1 View  Result Date: 01/03/2016 CLINICAL DATA:  OG tube placement EXAM: ABDOMEN - 1 VIEW COMPARISON:  Chest radiograph-earlier same day FINDINGS: Enteric tube tip and side port project over the expected location of the gastric fundus. Large colonic stool burden without definite evidence of enteric obstruction. Post lower thoracic/lumbar paraspinal fusion. Extensive bilateral heterogeneous airspace opacities, similar to chest radiograph performed earlier same date IMPRESSION: 1. Enteric tip and side port projected the expected location of the gastric fundus. 2. Large colonic stool burden. Electronically Signed   By: Sandi Mariscal M.D.   On: 01/03/2016 22:30  Dg Chest Port 1 View  Result Date: 01/03/2016 CLINICAL DATA:  Status post intubation EXAM: PORTABLE CHEST 1 VIEW COMPARISON:   Film from earlier in the same day FINDINGS: Cardiac shadow is again enlarged. Diffuse chronic changes are again seen bilaterally. An endotracheal tube is now noted 3.6 cm above the carina. No new focal abnormality is seen. IMPRESSION: Endotracheal tube in satisfactory position. The remainder of the exam is stable. Electronically Signed   By: Inez Catalina M.D.   On: 01/03/2016 19:38  Dg Chest Portable 1 View  Result Date: 01/03/2016 CLINICAL DATA:  Hypoxia and shortness of Breath EXAM: PORTABLE CHEST 1 VIEW COMPARISON:  12/29/2015 FINDINGS: Cardiac shadow is enlarged. Diffuse interstitial changes and alveolar changes are again noted. Given some variations in the technical factors of film the overall appearance is stable. No new focal infiltrate is seen. Postsurgical changes are again noted in the lower thoracic spine. IMPRESSION: Likely chronic interstitial and alveolar changes similar to that seen on prior exam. Electronically Signed   By: Inez Catalina M.D.   On: 01/03/2016 18:29   Procedures Procedures (including critical care time)  CRITICAL CARE Performed by: Merrily Pew Total critical care time: 45 minutes Critical care time was exclusive of separately billable procedures and treating other patients. Critical care was necessary to treat or prevent imminent or life-threatening deterioration. Critical care was time spent personally by me on the following activities: development of treatment plan with patient and/or surrogate as well as nursing, discussions with consultants, evaluation of patient's response to treatment, examination of patient, obtaining history from patient or surrogate, ordering and performing treatments and interventions, ordering and review of laboratory studies, ordering and review of radiographic studies, pulse oximetry and re-evaluation of patient's condition.   Medications Ordered in ED Medications  0.9 %  sodium chloride infusion (0 mLs Intravenous Stopped 01/03/16  1940)  0.9 %  sodium chloride infusion ( Intravenous New Bag/Given 01/03/16 1940)  ipratropium-albuterol (DUONEB) 0.5-2.5 (3) MG/3ML nebulizer solution 3 mL (3 mLs Nebulization Given 01/03/16 2003)  albuterol (PROVENTIL) (2.5 MG/3ML) 0.083% nebulizer solution 2.5 mg (not administered)  chlorhexidine gluconate (SAGE KIT) (PERIDEX) 0.12 % solution 15 mL (not administered)  antiseptic oral rinse solution (CORINZ) (not administered)  phenylephrine (NEO-SYNEPHRINE) 10 mg in dextrose 5 % 250 mL (0.04 mg/mL) infusion (40 mcg/min Intravenous Rate/Dose Change 01/03/16 2152)  methylPREDNISolone sodium succinate (SOLU-MEDROL) 40 mg/mL injection 40 mg (40 mg Intravenous Given 01/03/16 2022)  feeding supplement (VITAL HIGH PROTEIN) liquid 1,000 mL (  not administered)  feeding supplement (PRO-STAT SUGAR FREE 64) liquid 30 mL (not administered)  insulin aspart (novoLOG) injection 2-6 Units (not administered)  fentaNYL (SUBLIMAZE) 2,500 mcg in sodium chloride 0.9 % 250 mL (10 mcg/mL) infusion (150 mcg/hr Intravenous Rate/Dose Change 01/03/16 2209)  fentaNYL (SUBLIMAZE) bolus via infusion 25 mcg (not administered)  famotidine (PEPCID) IVPB 20 mg premix (not administered)  imipenem-cilastatin (PRIMAXIN) 500 mg in sodium chloride 0.9 % 100 mL IVPB (not administered)  vancomycin (VANCOCIN) IVPB 1000 mg/200 mL premix (not administered)  vancomycin (VANCOCIN) IVPB 1000 mg/200 mL premix (0 mg Intravenous Stopped 01/03/16 2043)  fentaNYL (SUBLIMAZE) 100 MCG/2ML injection (100 mcg  Given 01/03/16 1856)  midazolam (VERSED) 2 MG/2ML injection (2 mg  Given 01/03/16 1857)  etomidate (AMIDATE) injection (20 mg Intravenous Given 01/03/16 1858)  0.9 %  sodium chloride infusion ( Intravenous Stopped 01/03/16 1938)  fentaNYL (SUBLIMAZE) injection 50 mcg (50 mcg Intravenous Given 01/03/16 2021)  imipenem-cilastatin (PRIMAXIN) 500 mg in sodium chloride 0.9 % 100 mL IVPB (500 mg Intravenous New Bag/Given 01/03/16 2044)     Initial  Impression / Assessment and Plan / ED Course  I have reviewed the triage vital signs and the nursing notes.  Pertinent labs & imaging results that were available during my care of the patient were reviewed by me and considered in my medical decision making (see chart for details).  Clinical Course  Comment By Time  Suspect CHF exac v pneumonia, 87-89% on NRB, will start bpap and xr. Feels warm, will culture, code sepsis preemptively.  Merrily Pew, MD 07/24 1815  Reveiwed xr at bedside. Appears to be c/w fluid overload. Will continue with bipap. Add on bnp, troponin. Give lasix, hold on NTG with soft BP Merrily Pew, MD 07/24 1820   PCCM in to see patient and will admit to ICU.   Final Clinical Impressions(s) / ED Diagnoses   Final diagnoses:  Dyspnea  Acute respiratory failure with hypoxia (HCC)  Hypotension, unspecified hypotension type    New Prescriptions Current Discharge Medication List       Merrily Pew, MD 01/04/16 [phone]

## 2016-01-03 NOTE — H&P (Signed)
PULMONARY / CRITICAL CARE MEDICINE   Name: Curtis Frazier MRN: [id number] DOB: [birth year]    ADMISSION DATE:  01/03/2016 CONSULTATION DATE:  01/03/2016  REFERRING MD:  Dr. Dayna Barker EDP  CHIEF COMPLAINT:  SOB/AMS  HISTORY OF PRESENT ILLNESS:   75 year old male with PMH as below which is significant for Diastolic CHF, CKD, COPD, PAF (s/p recent ablation), and rheumatoid arthritis (recently on remicade, now on chronic pred 11m). He has several recent hospitalizations. Most recently he was admitted 7/15 for presumed HCAP. He was streated with 9 days of HCAP antibiotics [facility] 7/24 he was noted to have AMS to the point where he was essentially comatose. O2 sats noted to be about 60% on room air at that time. [facility] was [facility] on BiPAP with some improvement. CXR was performed which is consistent with interstitial lung changes noted on very recent CT. PCCM asked to see for admission.   PAST MEDICAL HISTORY :  He  has a past medical history of Actinic keratosis; Arthritis; Bruises easily; CAD (coronary artery disease); Carotid arterial disease ([place]; Chronic back pain; Chronic diastolic CHF (congestive heart failure) (HCC); CKD (chronic kidney disease), stage III; COPD (chronic obstructive pulmonary disease) ([place] (dx'd 12/10/2015); Degeneration of intervertebral disc, site unspecified; Dyspnea on exertion; Esophageal reflux; Gastritis (12/10); GI bleed; Hemorrhoids; History of blood transfusion (2010); History of colonic polyps; Hyperlipidemia; Hypertensive heart disease; Joint pain; Lumbar stenosis; Moderate aortic stenosis; Nephrolithiasis; Other psoriasis; Paroxysmal atrial fibrillation ([place]; Personal history of colonic polyps; PMR (polymyalgia rheumatica) ([place] (01/20/2012); Pneumonia (2008; 07/2015); Psoriasis; Psoriatic arthropathy (HE[place]; Rheumatoid arthritis(714.0); Scoliosis (and kyphoscoliosis), idiopathic; Skin cancer; Sleep  apnea; STEMI (ST elevation myocardial infarction) ([place] (02/2010); and Type II diabetes mellitus (HL[place].  PAST SURGICAL HISTORY: He  has a past surgical history that includes Shoulder open rotator cuff repair (Left, 2000); Colonoscopy; Vasectomy (1979); carotid doppler (10/12); Cataract extraction w/ intraocular lens  implant, bilateral (Bilateral, 2012); Lithotripsy (2009); TEE without cardioversion (N/A, 08/06/2015); Cardioversion (N/A, 08/06/2015); Back surgery; Lumbar laminectomy (07/2015); Posterior laminectomy thoracic spine; Skin cancer excision; TEE without cardioversion (N/A, 09/14/2015); Cardioversion (N/A, 09/14/2015); TEE without cardioversion (N/A, 11/04/2015); Cardioversion (N/A, 11/04/2015); Cardiac catheterization (N/A, 12/15/2015); Coronary angioplasty with stent (02/2009); Cardiac catheterization (2010); and Laminectomy thoracic fusion posterior (06/2011).  Allergies  Allergen Reactions  . Ace Inhibitors Other (See Comments)    Causes high K   . Angiotensin Receptor Blockers Other (See Comments)    Causes high K   . Metoprolol Other (See Comments)    May cause elevated K level   . Penicillins Swelling and Rash    Has patient had a PCN reaction causing immediate rash, facial/tongue/throat swelling, SOB or lightheadedness with hypotension: YES Has patient had a PCN reaction causing severe rash involving mucus membranes or skin necrosis: NO Has patient had a PCN reaction that required hospitalization NO Has patient had a PCN reaction occurring within the last 10 years: NO If all of the above answers are "NO", then may proceed with Cephalosporin use. Pt has tolerated cephalosporins in the past   . Amiodarone Other (See Comments)    12/27/15: Cardiologist discontinued the amiodarone (on PTA) due to  concern for toxicity  (in hospitial with acute hypoxemic respiratory failure)   . Tetracycline Swelling and Rash    No current facility-administered medications on file prior to encounter.     Current Outpatient Prescriptions on File Prior to Encounter  Medication Sig  .  acetaminophen (TYLENOL) 325 MG tablet Take 650 mg by mouth every 6 (six) hours as needed (Pain scale of 1-4 or temp > 101).  Marland Kitchen albuterol (PROVENTIL HFA;VENTOLIN HFA) 108 (90 Base) MCG/ACT inhaler Inhale 2 puffs into the lungs every 6 (six) hours as needed for wheezing or shortness of breath.  Marland Kitchen albuterol (PROVENTIL) (2.5 MG/3ML) 0.083% nebulizer solution Take 3 mLs (2.5 mg total) by nebulization every 4 (four) hours as needed for wheezing or shortness of breath.  . calcium-vitamin D (OSCAL WITH D) 500-200 MG-UNIT tablet Take 1 tablet by mouth daily with breakfast.  . diltiazem (CARDIZEM CD) 240 MG 24 hr capsule TAKE 1 CAPSULE BY MOUTH DAILY  . docusate sodium (COLACE) 100 MG capsule Take 100 mg by mouth 2 (two) times daily. Reported on 09/22/2015  . ferrous sulfate 325 (65 FE) MG tablet Take 1 tablet (325 mg total) by mouth 3 (three) times daily with meals.  Marland Kitchen glucose blood (ONE TOUCH ULTRA TEST) test strip Check blood sugar three times daily and as directed. Dx E11.8 diabetes that is poorly controlled  . insulin aspart (NOVOLOG) 100 UNIT/ML injection CBG 70 - 120: 0 units CBG 121 - 150: 2 units CBG 151 - 200: 3 units CBG 201 - 250: 5 units CBG 251 - 300: 8 units CBG 301 - 350: 11 units CBG 351 - 400: 15 units (Patient taking differently: Inject 0-15 Units into the skin 4 (four) times daily as needed for high blood sugar (sliding scale). CBG 70 - 120: 0 units CBG 121 - 150: 2 units CBG 151 - 200: 3 units CBG 201 - 250: 5 units CBG 251 - 300: 8 units CBG 301 - 350: 11 units CBG 351 - 400: 15 units)  . insulin detemir (LEVEMIR) 100 UNIT/ML injection Inject 0.15 mLs (15 Units total) into the skin daily.  . pantoprazole (PROTONIX) 40 MG tablet Take 1 tablet (40 mg total) by mouth daily.  . pravastatin (PRAVACHOL) 20 MG tablet Take 1 tablet (20 mg total) by mouth every evening.  . predniSONE (DELTASONE) 5 MG tablet  Take 1 tablet (5 mg total) by mouth daily with breakfast.  . thiamine 100 MG tablet Take 1 tablet (100 mg total) by mouth daily.  Marland Kitchen tiotropium (SPIRIVA) 18 MCG inhalation capsule Place 1 capsule (18 mcg total) into inhaler and inhale daily.  Marland Kitchen warfarin (COUMADIN) 2.5 MG tablet Take 2.5 mg by mouth daily. Give every Tuesday, Thursday, Saturday and Sunday  . warfarin (COUMADIN) 5 MG tablet Take 5 mg by mouth daily. Give Monday, Wednesday and Friday    FAMILY HISTORY:  His indicated that his mother is deceased. He indicated that his father is deceased. He indicated that the status of his sister is unknown. He indicated that the status of his neg hx is unknown.    SOCIAL HISTORY: He  reports that he quit smoking about 7 years ago. His smoking use included Cigarettes. He has a 79.50 pack-year smoking history. He has never used smokeless tobacco. He reports that he drinks alcohol. He reports that he does not use drugs.  REVIEW OF SYSTEMS:  Difficult due to BiPAP and AMS Bolds are positive  Constitutional: weight loss, gain, night sweats, Fevers, chills, fatigue .  HEENT: headaches, Sore throat, sneezing, nasal congestion, post nasal drip, Difficulty swallowing, Tooth/dental problems, visual complaints visual changes, ear ache CV:  chest pain, radiates: ,Orthopnea, PND, swelling in lower extremities, dizziness, palpitations, syncope.  GI  heartburn, indigestion, abdominal pain, nausea, vomiting, diarrhea, change  in bowel habits, loss of appetite, bloody stools.  Resp: cough, productive: , hemoptysis, dyspnea, chest pain, pleuritic.  Skin: rash or itching or icterus GU: dysuria, change in color of urine, urgency or frequency. flank pain, hematuria  MS: joint pain or swelling. decreased range of motion  Psych: change in mood or affect. depression or anxiety.  Neuro: difficulty with speech, weakness, numbness, ataxia    SUBJECTIVE:    VITAL SIGNS: BP (!) 84/59   Pulse 92   Temp 98.9 F (37.2  C) (Oral)   Resp (!) 27   SpO2 94%   HEMODYNAMICS:    VENTILATOR SETTINGS: Vent Mode: PCV;BIPAP FiO2 (%):  [50 %] 50 % Set Rate:  [14 bmp] 14 bmp PEEP:  [5 cmH20] 5 cmH20  INTAKE / OUTPUT: No intake/output data recorded.  PHYSICAL EXAMINATION: General:  Elderly male with increased WOB on BiPAP Neuro:  Alert, seemingly oriented, non-focal HEENT:  [place]/AT, PERRL, no JVD Cardiovascular:  RRR, no MRG Lungs:  Bilateral rales Abdomen:  Soft, non-tender Musculoskeletal:  No acute deformity or ROM limitation Skin:  Grossly intact  LABS:  BMET  Recent Labs Lab 12/29/15 0420  NA 130*  K 4.4  CL 95*  CO2 27  BUN 32*  CREATININE 1.60*  GLUCOSE 113*    Electrolytes  Recent Labs Lab 12/29/15 0420  CALCIUM 9.7    CBC  Recent Labs Lab 12/28/15 0738 12/29/15 0420  WBC 13.3* 12.7*  HGB 11.7* 12.0*  HCT 35.5* 36.9*  PLT 359 350    Coag's  Recent Labs Lab 12/30/15 0547 12/31/15 0401 01/01/16 0303  INR 2.45* 2.57* 2.42*    Sepsis Markers  Recent Labs Lab 12/28/15 0738 12/30/15 0547 01/03/16 1905  LATICACIDVEN  --   --  1.19  PROCALCITON 0.46 0.38  --     ABG No results for input(s): PHART, PCO2ART, PO2ART in the last 168 hours.  Liver Enzymes  Recent Labs Lab 12/30/15 0547  ALBUMIN 2.1*    Cardiac Enzymes No results for input(s): TROPONINI, PROBNP in the last 168 hours.  Glucose  Recent Labs Lab 12/31/15 1201 12/31/15 1630 12/31/15 2040 01/01/16 0546 01/01/16 1135 01/01/16 1629  GLUCAP 153* 162* 105* 162* 202* 159*    Imaging Dg Chest Portable 1 View  Result Date: 01/03/2016 CLINICAL DATA:  Hypoxia and shortness of Breath EXAM: PORTABLE CHEST 1 VIEW COMPARISON:  12/29/2015 FINDINGS: Cardiac shadow is enlarged. Diffuse interstitial changes and alveolar changes are again noted. Given some variations in the technical factors of film the overall appearance is stable. No new focal infiltrate is seen. Postsurgical changes are again  noted in the lower thoracic spine. IMPRESSION: Likely chronic interstitial and alveolar changes similar to that seen on prior exam. Electronically Signed   By: Inez Catalina M.D.   On: 01/03/2016 18:29    STUDIES:  CTA chest 7/15> No CT evidence for acute pulmonary embolus. Interlobular septal thickening with bilateral ground-glass attenuation. Imaging features are compatible with pulmonary edema although superimposed infectious/inflammatory etiology is not excluded. Bibasilar collapse/ consolidation. Interval progression of mediastinal and right hilar lymphadenopathy, nonspecific.   CULTURES: BC x 2 7/24 > Urine 7/24 > BAL 7/24 > RVP 7/24 > PCP 7/24 >  ANTIBIOTICS: Aztreonam 7/24 > Levaquin 7/24 > Vancomycin 7/24 >  SIGNIFICANT EVENTS: 7/22 discharge to SNF  LINES/TUBES: ETT 7/24 >  DISCUSSION: 75 year old male with recent admission for SOB  ASSESSMENT / PLAN:  PULMONARY A: Acute hypoxemic respiratory failure Diffuse pulmonary infiltrates infectious vs  autoimmune, doubt CHF  P:   Full vent support ABG CXR for ETT placement ESR, RF, C3, C4, ANCA Solumedrol 76m q 12 hours  CARDIOVASCULAR A:  Hypotension > sedation induced Chronic diastolic CHF  P:  Telemetry  MAP goal > 628mg Peripheral neo for now, may need CVL and pressors if does not improve IVF resuscitation   RENAL A:   Chronic kidney disease  P:   BMP pending  GASTROINTESTINAL A:   No acute issues  P:   TF Pepcid for SUP  HEMATOLOGIC A:   Warfarin cougulopathy  P:  Follow CBC Repeat INR in AM  Hold warfarin, will likely need hep gtt.   INFECTIOUS A:   HCAP  P:   Imipenem Vancomycin Cultures as above RVP  ENDOCRINE A:   Chronic prednisone use  P:   Steroids as above CBG monitoring and SSI  NEUROLOGIC A:   Acute metabolic encephalopathy due to hypoxemia and medical sedation.   P:   RASS goal: -1 to -2 Fentanyl infusion   FAMILY  - Updates: Son updated bedside  by DF  - Inter-disciplinary family meet or Palliative Care meeting due by: 7/30   PaGeorgann HousekeeperAGACNP-BC [facility]/Critical Care Pager [phone] ([phone]/24/2017 7:32 PM      STAFF NOTE: I,Linwood DibblesMD FACP have personally reviewed patient's available data, including medical history, events of note, physical examination and test results as part of my evaluation. I have discussed with resident/NP and other care providers such as pharmacist, RN and RRT. In addition, I personally evaluated patient and elicited key findings of : distress breathing on nimv, hypoxia present, coarse BS bilateral, JVD wnl not elevated, no edema, no fevers, I had multiple d/w daughter today at rehab with history described as worsening resp and lethargy and desaturation into 60%, seen by rehab MD as well, in ER with 100 NRB needs escallated to NIMV, I reviewed last admit and CT and pcxr, treated hcap and recent dc out of hospital, failing NIMV, ETT placed, bronch done given immune status worsening infiltrates despote treatment and pred dependence, bronch with creamy white thick secretions diffuse grossly c/w PNA, it is unclear to me if he has reoccurent PNA (missing organism last dc) vs non infectious process in setting RA and wrosening hip pains, will add autoimmune panel and low dose steroids, has tolerated cefpeime in past and will use IMi and vanc, ARDS protocol, keep plat less 30 , await labs in ER, repeat pcxr post intubation and abg, start feeds with OGT, follow bronch closely, called ID to see pt In am , I updated son in room and in waitin room on multiple occassions  The patient is critically ill with multiple organ systems failure and requires high complexity decision making for assessment and support, frequent evaluation and titration of therapies, application of advanced monitoring technologies and extensive interpretation of multiple databases.   Critical Care Time devoted to  patient care services described in this note is 85 min Minutes. This time reflects time of care of this signee: DaMerrie RoofMD FACP. This critical care time does not reflect procedure time, or teaching time or supervisory time of PA/NP/Med student/Med Resident etc but could involve care discussion time. Rest per NP/medical resident whose note is outlined above and that I agree with   DaLavon PaganiniFeTitus MouldMD, FAGranitegr: 37[facility] [facility] 01/03/2016 11:40 PM

## 2016-01-03 NOTE — ED Triage Notes (Signed)
Per EMS, patient has had increased lethargy/confusion. Patient's oxygen saturation on room air 60%. EMS placed patient on 15 L non-rebreather, highest oxygen saturation 86%. Patient has labored breathing. Patient's lungs clear; besides slight expiratory wheezing left lower lobe 99.3 oral temperature at nursing facility. Initial pressure 80/40; patient given 500 mL NS bolus; blood pressure 91/54 Recent treatment for pneumonia. Patient is from [facility] facility.

## 2016-01-03 NOTE — Procedures (Signed)
Bronchoscopy Procedure Note Curtis Frazier RJ:[id number] 1940-06-29  Procedure: Bronchoscopy Indications: Diagnostic evaluation of the airways - re occurrent resp failure in setting recent abx and immune suppression  Procedure Details Consent: Risks of procedure as well as the alternatives and risks of each were explained to the (patient/caregiver).  Consent for procedure obtained. Time Out: Verified patient identification, verified procedure, site/side was marked, verified correct patient position, special equipment/implants available, medications/allergies/relevent history reviewed, required imaging and test results available.  Performed  In preparation for procedure, patient was given 100% FiO2 and bronchoscope lubricated. Sedation: Etomidate  Airway entered and the following bronchi were examined: RUL, RML, RLL, LUL, LLL and Bronchi.   Procedures performed: Brushings performed- no Bronchoscope removed.  , Patient placed back on 100% FiO2 at conclusion of procedure.    Evaluation Hemodynamic Status: BP stable throughout; O2 sats: stable throughout Patient's Current Condition: stable Specimens:  Sent purulent fluid Complications: No apparent complications Patient did tolerate procedure well.   Curtis Frazier. 01/03/2016   1. Diffuse thick wight creamy pus throughout, occluded scope multiple times 2. BAL RML, lingula 3. Chronic divert from smoking? 4. Mild self resolving bronch trauma rt ant mainstem  Curtis Frazier. Curtis Mould, MD, [place] Pgr: [place] [facility]

## 2016-01-03 NOTE — Progress Notes (Signed)
LOCATION: [facility]  PCP: Loura Pardon, MD   Code Status: Full Code  Goals of care: Advanced Directive information Advanced Directives 12/22/2015  Does patient have an advance directive? No  Would patient like information on creating an advanced directive? No - patient declined information  Pre-existing out of facility DNR order (yellow form or pink MOST form) -       Extended Emergency Contact Information Primary Emergency Contact: Payton [place] of [place] Phone: [phone] Mobile Phone: [phone] Relation: Daughter Secondary Emergency Contact: Carras,Doris Address: [address]          [place], [place] [address] Johnnette Litter of [place] Phone: [phone] Mobile Phone: [phone] Relation: Spouse   Allergies  Allergen Reactions  . Ace Inhibitors Other (See Comments)    Causes high K   . Angiotensin Receptor Blockers Other (See Comments)    Causes high K   . Metoprolol Other (See Comments)    May cause elevated K level   . Penicillins Swelling and Rash    Has patient had a PCN reaction causing immediate rash, facial/tongue/throat swelling, SOB or lightheadedness with hypotension: YES Has patient had a PCN reaction causing severe rash involving mucus membranes or skin necrosis: NO Has patient had a PCN reaction that required hospitalization NO Has patient had a PCN reaction occurring within the last 10 years: NO If all of the above answers are "NO", then may proceed with Cephalosporin use. Pt has tolerated cephalosporins in the past   . Amiodarone Other (See Comments)    12/27/15: Cardiologist discontinued the amiodarone (on PTA) due to  concern for toxicity  (in hospitial with acute hypoxemic respiratory failure)   . Tetracycline Swelling and Rash    Chief Complaint  Patient presents with  . New Admit To SNF    New Admission     HPI:  Patient is a 75 y.o. male seen today for short term rehabilitation post hospital  admission from 12/22/15-01/01/16 with acute respiratory failure with right lower lobe pneumonia, chf exacerbation and pleuritic chest pain. He has PMH of DM, CKD 3, CHF, afib, HLD among others. He was treated with antibiotics and lasix PE was ruled out. Amiodarone was discontinued. He is seen in his room today. His daughter is at bedside.   Review of Systems:  Constitutional: Negative for fever, chills, diaphoresis. He feels weak and tired.  HENT: Negative for headache,nasal discharge, sore throat, difficulty swallowing. Positive for sinus congestion.   Eyes: Negative for blurred vision, double vision and discharge.  Respiratory: Negative for shortness of breath and wheezing. On 4 1/2 liters of oxygen. Positive for cough  Cardiovascular: Negative for chest pain, palpitations, leg swelling.  Gastrointestinal: Negative for heartburn, nausea, vomiting, abdominal pain.  Last bowel movement was this morning with loose stool.  Genitourinary: Negative for dysuria and flank pain.  Musculoskeletal: Negative for fall in the facility.  Skin: Negative for itching, rash.  Neurological: Negative for dizziness. Psychiatric/Behavioral: Negative for depression   Past Medical History:  Diagnosis Date  . Actinic keratosis   . Arthritis    "hips" (12/15/2015)  . Bruises easily   . CAD (coronary artery disease)    a. 2010 s/p MI/Cath/PCI ([facility]): LM 10, LAD 30p, 18m, D1 20, D2 30, LCX 30p, 63m (3.5 x 15 Driver BMS), [id number], RCA 70p, 46m; b. 04/2011 Neg MV;  c. 04/2015 Myoview: very mild apical thinning, no ischemia, EF 65%.  . Carotid arterial disease ([facility])    a. 10/2015  Carotid US: bilat 1-39% heterogeneous plaque/stenosis. Nl subclavian arteries bilat.  . Chronic back pain    scoliosis/stenosis/radiculopathy,degenerative disc disease  . Chronic diastolic CHF (congestive heart failure) ([facility])    a. 08/2015 Echo: EF 55-60%, mild LVH, no rwma, Gr1 DD, mod AS, mildly dil LA, PASP 25mmHg.  . CKD (chronic kidney disease),  stage III    a. 09/2015 AKI in setting of dehydration - seen @ [facility].  Marland Kitchen COPD (chronic obstructive pulmonary disease) ([facility]) dx'd 12/10/2015  . Degeneration of intervertebral disc, site unspecified   . Dyspnea on exertion   . Esophageal reflux    takes Omeprazole daily  . Gastritis 12/10  . GI bleed    a. 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  . Hemorrhoids   . History of blood transfusion 2010   "don't remember why"  . History of colonic polyps   . Hyperlipidemia    a. takes pravachol.  . Hypertensive heart disease    a. takes Metoprolol daily  . Joint pain   . Lumbar stenosis    a. s/p lumbar diskectomy 2017.  . Moderate aortic stenosis    a. 08/2015 Mod AS, valve area (VTI) - 1.5cm^2, (Vmax) - 1.25cm^2; b. 10/2015 TEE EF 55-60%, mod AS.  Marland Kitchen Nephrolithiasis   . Other psoriasis   . Paroxysmal atrial fibrillation ([facility])    a. 06/2011 Post-op AF-->converted on amio;  b. 07/2015 TEE/unsuccessful DCCV x 2; c. 09/2015 recurrent AF/Flutter-->successful DCCV; d. 09/2015 Recurrent AF-->10/2015 s/p TEE (EF 55-60%, mod AS, mild MR, sev dil LA w/o LAA thrombus, sev dil RA, mild to mod TR) & DCCV (120 J x 1).  . Personal history of colonic polyps   . PMR (polymyalgia rheumatica) ([facility]) 01/20/2012   tx by specialist on a long prednisone taper  . Pneumonia 2008; 07/2015  . Psoriasis   . Psoriatic arthropathy ([facility])   . Rheumatoid arthritis(714.0)    takes Methotrexate 7pills weekly  . Scoliosis (and kyphoscoliosis), idiopathic   . Skin cancer    "arms; they freeze then off; cut one off left elbow recently" (12/15/2015)  . Sleep apnea    "had it while in hospital in 07/2015" (12/15/2015)  . STEMI (ST elevation myocardial infarction) ([facility]) 02/2010   Archie Endo 05/2009  . Type II diabetes mellitus ([facility])    Past Surgical History:  Procedure Laterality Date  . BACK SURGERY    . CARDIAC CATHETERIZATION  2010  . CARDIOVERSION N/A 08/06/2015   Procedure: CARDIOVERSION;  Surgeon: Skeet Latch, MD;  Location: [facility];  Service: Cardiovascular;  Laterality: N/A;  . CARDIOVERSION N/A 09/14/2015   Procedure: CARDIOVERSION;  Surgeon: Jerline Pain, MD;  Location: [facility];  Service: Cardiovascular;  Laterality: N/A;  . CARDIOVERSION N/A 11/04/2015   Procedure: CARDIOVERSION;  Surgeon: Sanda Klein, MD;  Location: [facility];  Service: Cardiovascular;  Laterality: N/A;  . carotid doppler  10/12   0-39% R and 60-79% left   . CATARACT EXTRACTION W/ INTRAOCULAR LENS  IMPLANT, BILATERAL Bilateral 2012  . COLONOSCOPY    . CORONARY ANGIOPLASTY WITH STENT PLACEMENT  02/2009   has one stent  . ELECTROPHYSIOLOGIC STUDY N/A 12/15/2015   Procedure: A-Flutter;  Surgeon: Will Meredith Leeds, MD;  Location: [facility];  Service: Cardiovascular;  Laterality: N/A;  . LAMINECTOMY THORACIC FUSION POSTERIOR  06/2011   Decompressive laminectomy at L12, L2-3, L4-5; posterior segmental agitation T10-L5 with globus titanium pedicle screws and rods; T10-11, and T11-12, T12-L1, L1-2, L2-3, L3-4, L4-5 posterior lateral  arthrodesis with local morselized autograft bone, bone morphogenic protein, Actifuse bone graft extender./notes 07/03/2011  . LITHOTRIPSY  2009  . LUMBAR LAMINECTOMY  07/2015  . POSTERIOR LAMINECTOMY THORACIC SPINE     "fixed the discs"  . SHOULDER OPEN ROTATOR CUFF REPAIR Left 2000  . SKIN CANCER EXCISION     "arms"  . TEE WITHOUT CARDIOVERSION N/A 08/06/2015   Procedure: TRANSESOPHAGEAL ECHOCARDIOGRAM (TEE);  Surgeon: Skeet Latch, MD;  Location: [facility];  Service: Cardiovascular;  Laterality: N/A;  . TEE WITHOUT CARDIOVERSION N/A 09/14/2015   Procedure: TRANSESOPHAGEAL ECHOCARDIOGRAM (TEE);  Surgeon: Jerline Pain, MD;  Location: [facility];  Service: Cardiovascular;  Laterality: N/A;  . TEE WITHOUT CARDIOVERSION N/A 11/04/2015   Procedure: TRANSESOPHAGEAL ECHOCARDIOGRAM (TEE);  Surgeon: Sanda Klein, MD;  Location: [facility];  Service: Cardiovascular;  Laterality: N/A;  . VASECTOMY   1979   Social History:   reports that he quit smoking about 7 years ago. His smoking use included Cigarettes. He has a 79.50 pack-year smoking history. He has never used smokeless tobacco. He reports that he drinks alcohol. He reports that he does not use drugs.  Family History  Problem Relation Age of Onset  . Coronary artery disease Mother   . Diabetes Mother   . CAD Mother   . Coronary artery disease Sister   . Hypertension Sister   . Kidney failure Sister   . CAD Sister   . Diabetes Brother   . Prostate cancer Brother   . Pancreatic cancer Brother   . Diabetes Brother   . Anesthesia problems Neg Hx   . Hypotension Neg Hx   . Malignant hyperthermia Neg Hx   . Pseudochol deficiency Neg Hx     Medications:   Medication List       Accurate as of 01/03/16  2:24 PM. Always use your most recent med list.          acetaminophen 325 MG tablet Commonly known as:  TYLENOL Take 650 mg by mouth every 6 (six) hours as needed (Pain scale of 1-4 or temp > 101).   albuterol 108 (90 Base) MCG/ACT inhaler Commonly known as:  PROVENTIL HFA;VENTOLIN HFA Inhale 2 puffs into the lungs every 6 (six) hours as needed for wheezing or shortness of breath.   albuterol (2.5 MG/3ML) 0.083% nebulizer solution Commonly known as:  PROVENTIL Take 3 mLs (2.5 mg total) by nebulization every 4 (four) hours as needed for wheezing or shortness of breath.   calcium-vitamin D 500-200 MG-UNIT tablet Commonly known as:  OSCAL WITH D Take 1 tablet by mouth daily with breakfast.   diltiazem 240 MG 24 hr capsule Commonly known as:  CARDIZEM CD TAKE 1 CAPSULE BY MOUTH DAILY   docusate sodium 100 MG capsule Commonly known as:  COLACE Take 100 mg by mouth 2 (two) times daily. Reported on 09/22/2015   ferrous sulfate 325 (65 FE) MG tablet Take 1 tablet (325 mg total) by mouth 3 (three) times daily with meals.   glucose blood test strip Commonly known as:  ONE TOUCH ULTRA TEST Check blood sugar three  times daily and as directed. Dx E11.8 diabetes that is poorly controlled   insulin aspart 100 UNIT/ML injection Commonly known as:  novoLOG CBG 70 - 120: 0 units CBG 121 - 150: 2 units CBG 151 - 200: 3 units CBG 201 - 250: 5 units CBG 251 - 300: 8 units CBG 301 - 350: 11 units CBG 351 - 400: 15 units  insulin detemir 100 UNIT/ML injection Commonly known as:  LEVEMIR Inject 0.15 mLs (15 Units total) into the skin daily.   pantoprazole 40 MG tablet Commonly known as:  PROTONIX Take 1 tablet (40 mg total) by mouth daily.   pravastatin 20 MG tablet Commonly known as:  PRAVACHOL Take 1 tablet (20 mg total) by mouth every evening.   predniSONE 5 MG tablet Commonly known as:  DELTASONE Take 1 tablet (5 mg total) by mouth daily with breakfast.   thiamine 100 MG tablet Take 1 tablet (100 mg total) by mouth daily.   tiotropium 18 MCG inhalation capsule Commonly known as:  SPIRIVA Place 1 capsule (18 mcg total) into inhaler and inhale daily.   warfarin 2.5 MG tablet Commonly known as:  COUMADIN Take 2.5 mg by mouth daily. Give every Tuesday, Thursday, Saturday and Sunday   warfarin 5 MG tablet Commonly known as:  COUMADIN Take 5 mg by mouth daily. Give Monday, Wednesday and Friday       Immunizations: Immunization History  Administered Date(s) Administered  . Influenza Whole 05/30/2006, 04/24/2007, 03/18/2008, 03/09/2009, 04/11/2010  . Influenza, High Dose Seasonal PF 04/11/2015  . Influenza-Unspecified 03/13/2014  . Pneumococcal Conjugate-13 06/26/2014  . Pneumococcal Polysaccharide-23 01/17/2007  . Td 08/03/2004  . Tdap 05/16/2012     Physical Exam: Vitals:   01/03/16 1417  BP: 120/70  Pulse: 61  Resp: 20  Temp: 97.6 F (36.4 C)  TempSrc: Oral  SpO2: 94%  Weight: 152 lb 8 oz (69.2 kg)  Height: 5\' 8"  (1.727 m)   Body mass index is 23.19 kg/m.  General- elderly male, frail, well built, in no acute distress Head- normocephalic, atraumatic Nose- no nasal  discharge Throat- moist mucus membrane, oral thrush +, has dentures Eyes- PERRLA, EOMI, no pallor, no icterus, no discharge, normal conjunctiva, normal sclera Neck- no cervical lymphadenopathy Cardiovascular- irregular heart rate, no murmur, no leg edema Respiratory- bilateral decreased air movement, no wheeze, no rhonchi, no crackles, no use of accessory muscles, o2 4 l/min Abdomen- bowel sounds present, soft, non tender Musculoskeletal- able to move all 4 extremities, generalized weakness Neurological- alert and oriented to person, place and time Skin- warm and dry Psychiatry- normal mood and affect    Labs reviewed: Basic Metabolic Panel:  Recent Labs  08/06/15 0650 08/07/15 0615  08/09/15 0540  09/09/15 1223  12/25/15 0355 12/26/15 0358 12/27/15 0440 12/29/15 0420  NA 149* 152*  < > 151*  < > 137  < > 134* 129* 129* 130*  K 3.5 3.4*  < > 3.6  < > 4.5  < > 3.9 3.9 4.0 4.4  CL 110 113*  < > 114*  < > 110  < > 100* 95* 96* 95*  CO2 29 28  < > 27  < > 16*  < > 25 25 25 27   GLUCOSE 145* 201*  < > 195*  < > 256*  < > 124* 242* 185* 113*  BUN 29* 27*  < > 34*  < > 11  < > 18 26* 32* 32*  CREATININE 1.58* 1.47*  < > 1.80*  < > 1.39*  < > 1.46* 1.65* 1.84* 1.60*  CALCIUM 9.1 8.8*  < > 8.7*  < > 7.3*  < > 9.4 9.1 9.2 9.7  MG 1.9 1.9  --  1.8  < > 2.0  --  1.4* 1.7  --   --   PHOS 3.7 2.9  --  3.8  --   --   --   --   --   --   --   < > =  values in this interval not displayed. Liver Function Tests:  Recent Labs  10/25/15 1434 11/10/15 1231 12/10/15 1111 12/30/15 0547  AST 13 15 17   --   ALT 15 14 18   --   ALKPHOS 61 61 72  --   BILITOT 0.3 0.3 0.5  --   PROT 6.7 6.6 6.4  --   ALBUMIN 3.5 3.5 3.4* 2.1*    Recent Labs  09/07/15 1037  LIPASE 26    Recent Labs  08/01/15 0022 08/10/15 1940  AMMONIA 28 15   CBC:  Recent Labs  12/10/15 1111 12/22/15 1747  12/26/15 0358 12/27/15 0440 12/28/15 0738 12/29/15 0420  WBC 9.8 10.8*  < > 12.2* 11.5* 13.3* 12.7*    NEUTROABS 7.3 8.1*  --  9.3*  --   --   --   HGB 12.8* 12.4*  < > 11.3* 11.6* 11.7* 12.0*  HCT 39.2 38.9*  < > 34.3* 36.1* 35.5* 36.9*  MCV 90.2 91.3  < > 90.0 89.8 89.4 91.1  PLT 243.0 277  < > 317 339 359 350  < > = values in this interval not displayed. Cardiac Enzymes:  Recent Labs  09/09/15 0426 09/09/15 1058 09/09/15 1608  TROPONINI 0.04* 0.03 <0.03   BNP: Invalid input(s): POCBNP CBG:  Recent Labs  01/01/16 0546 01/01/16 1135 01/01/16 1629  GLUCAP 162* 202* 159*    Radiological Exams: Dg Chest 2 View  Result Date: 12/28/2015 CLINICAL DATA:  Shortness of breath.  Pneumonia. EXAM: CHEST  2 VIEW COMPARISON:  Chest CT 12/25/2015. Chest x-ray 12/24/2015, 10/11/2015. FINDINGS: Persistent bilateral hilar fullness noted consistent previously identified adenopathy. Cardiomegaly. Diffuse pulmonary interstitial prominence again noted. Active interstitial lung disease and/or interstitial edema could present this fashion. No pleural effusion or pneumothorax. Prior thoracolumbar spine fusion. Thoracolumbar scoliosis. IMPRESSION: 1. Persistent bilateral hilar fullness noted consistent with previously identified adenopathy. Reference is made to chest CT report 12/25/2015. 2.  Stable cardiomegaly. 3. Persistent bilateral pulmonary interstitial prominence consistent with active interstitial lung disease and/or interstitial edema. Electronically Signed   By: Marcello Moores  Register   On: 12/28/2015 07:28   Dg Chest 2 View  Result Date: 12/22/2015 CLINICAL DATA:  Cough and congestion for 2 days. Nausea. Question pneumonia. EXAM: CHEST  2 VIEW COMPARISON:  Two-view chest x-ray 11/10/2015. FINDINGS: The heart is mildly enlarged. There is slight increase in a diffuse interstitial pattern suggesting edema superimposed on chronic interstitial coarsening. Superior segment right lower lobe and right upper lobe airspace disease is present. Remote compression fractures are again noted in the mid thoracic  spine. Thoracolumbar fusion is noted. IMPRESSION: 1. Superior segment right lower lobe and right upper lobe airspace disease concerning for pneumonia. 2. Mild generalized edema superimposed on chronic interstitial disease. Electronically Signed   By: San Morelle M.D.   On: 12/22/2015 18:36   Ct Angio Chest Pe W Or Wo Contrast  Result Date: 12/25/2015 CLINICAL DATA:  Shortness of breath and hypoxia. EXAM: CT ANGIOGRAPHY CHEST WITH CONTRAST TECHNIQUE: Multidetector CT imaging of the chest was performed using the standard protocol during bolus administration of intravenous contrast. Multiplanar CT image reconstructions and MIPs were obtained to evaluate the vascular anatomy. CONTRAST:  100 cc Isovue-300 COMPARISON:  09/06/2015. FINDINGS: Mediastinum / Lymph Nodes: There is no axillary lymphadenopathy. Stable 15 mm left thyroid nodule. Mediastinal lymphadenopathy is progressed in the interval. 8 mm short axis prevascular lymph node. 14 mm short axis precarinal lymph node on today's study measured only 10 mm short axis previously. [place]  mm short axis right hilar lymph node is associated with a 14 mm short axis infrahilar right lymph node. Heart is enlarged. Coronary artery calcification is noted. The esophagus has normal imaging features. No evidence for filling defect in the opacified pulmonary arteries to suggest the presence of an acute pulmonary embolus. Lungs / Pleura: Interlobular septal thickening is associated with bilateral patchy ground-glass attenuation superimposed on emphysema. More focal airspace collapse/consolidation is identified in the lower lobes bilaterally. Upper Abdomen:  Unremarkable. MSK / Soft Tissues:  Incomplete visualization thoracolumbar fusion. Review of the MIP images confirms the above findings. IMPRESSION: 1. No CT evidence for acute pulmonary embolus. 2. Interlobular septal thickening with bilateral ground-glass attenuation. Imaging features are compatible with pulmonary edema  although superimposed infectious/inflammatory etiology is not excluded. Bibasilar collapse/ consolidation. 3. Interval progression of mediastinal and right hilar lymphadenopathy, nonspecific. Underlying neoplasm not excluded and PET-CT may prove helpful to further evaluate. Electronically Signed   By: Misty Stanley M.D.   On: 12/25/2015 13:28   Dg Chest Port 1 View  Result Date: 12/29/2015 CLINICAL DATA:  Hypoxia with congestion for 1 day EXAM: PORTABLE CHEST 1 VIEW COMPARISON:  Chest radiographs December 28, 2015 and Oct 11, 2015 ; chest CT December 25, 2015 FINDINGS: There is diffuse interstitial and patchy alveolar opacities, likely pulmonary edema. There is focal consolidation in the left lower lobe, stable from 1 day prior. No new opacity is evident. Heart size is upper normal. There is pulmonary venous hypertension. There is evidence of hilar adenopathy bilaterally, stable. No pneumothorax. There is postoperative change in the lower thoracic and visualized upper lumbar spine. IMPRESSION: Findings felt to be most consistent with congestive heart failure, stable from 1 day prior. Suspect superimposed pneumonia left lower lobe. There may be a degree of underlying ARDS as well. More than one of these entities may exist concurrently. Stable cardiac silhouette. Evidence of hilar adenopathy bilaterally, better seen on recent CT examination. Concern for underlying neoplasm. Electronically Signed   By: Lowella Grip III M.D.   On: 12/29/2015 08:59   Dg Chest Port 1 View  Result Date: 12/24/2015 CLINICAL DATA:  75 year old male with hypoxia. EXAM: PORTABLE CHEST 1 VIEW COMPARISON:  Chest radiograph dated 12/22/2015 FINDINGS: There is diffuse chronic interstitial coarsening and bronchiectatic changes. There is a patchy area of increased airspace density in the right upper lobe with progression compared to the study dated 12/22/2015 concerning for developing pneumonia. There is no pleural effusion or pneumothorax.  Stable mild cardiomegaly. Osteopenia with degenerative changes of the spine. Lower thoracic and lumbar spine fixation hardware. No acute osseous pathology. IMPRESSION: Findings concerning for developing right upper lobe pneumonia on a background of chronic interstitial lung disease. Clinical correlation and follow-up recommended. Electronically Signed   By: Anner Crete M.D.   On: 12/24/2015 03:00    Assessment/Plan  Generalized weakness Will have him work with physical therapy and occupational therapy team to help with gait training and muscle strengthening exercises.fall precautions. Skin care. Encourage to be out of bed.   Acute respiratory failure With pneumonia. Has completed antibiotics. To wean him off o2 as tolertaed to 2l to keep o2 sat > 90% with rest. Will make pulmonary appointment. Will have patient work with PT/OT as tolerated to regain strength and restore function.  Fall precautions are in place.  Right lower lobe pneumonia Completed antibiotics. Monitor clinically, encourage to use his incentive spirometer. Continue breathing treatment.   Hyponatremia Monitor bmp  Leukocytosis Afebrile. Recently treated for pneumonia and  is on chronic prednisone, check cbc and temp curve  Loose stool D/c current colace and have him on colace 100 mg daily as needed. Start probiotics with him recently on antibiotics  afib Rate controlled. Continue coumadin for anticoagulation with goal inr 2-3. Continue diltiazem 240 mg daily  Anemia of chronic disease Monitor cbc, continue feso4  CKD 3 Monitor bmp  RA Continue tylenol as needed for pain. Continue chronic prednisone.   gerd Stable on protonix  DM Lab Results  Component Value Date   HGBA1C 9.2 (H) 10/25/2015   Monitor cbg, continue levemir 15 u daily and SSI novolog   Goals of care: short term rehabilitation   Labs/tests ordered: cbc, cmp 01/04/16  Family/ staff Communication: reviewed care plan with patient and  nursing supervisor    Blanchie Serve, MD Internal Medicine [place], [place] [address] Cell Phone (Monday-Friday 8 am - 5 pm): [phone] On Call: [phone] and follow prompts after 5 pm and on weekends Office Phone: [phone] Office Fax: [phone]

## 2016-01-04 ENCOUNTER — Inpatient Hospital Stay (HOSPITAL_COMMUNITY): Payer: Medicare Other

## 2016-01-04 DIAGNOSIS — N179 Acute kidney failure, unspecified: Secondary | ICD-10-CM

## 2016-01-04 DIAGNOSIS — I959 Hypotension, unspecified: Secondary | ICD-10-CM

## 2016-01-04 DIAGNOSIS — J80 Acute respiratory distress syndrome: Principal | ICD-10-CM

## 2016-01-04 DIAGNOSIS — N189 Chronic kidney disease, unspecified: Secondary | ICD-10-CM

## 2016-01-04 DIAGNOSIS — J189 Pneumonia, unspecified organism: Secondary | ICD-10-CM

## 2016-01-04 DIAGNOSIS — M05719 Rheumatoid arthritis with rheumatoid factor of unspecified shoulder without organ or systems involvement: Secondary | ICD-10-CM

## 2016-01-04 LAB — GLUCOSE, CAPILLARY
GLUCOSE-CAPILLARY: 339 mg/dL — AB (ref 65–99)
GLUCOSE-CAPILLARY: 358 mg/dL — AB (ref 65–99)
GLUCOSE-CAPILLARY: 271 mg/dL — AB (ref 65–99)
GLUCOSE-CAPILLARY: 283 mg/dL — AB (ref 65–99)
GLUCOSE-CAPILLARY: 285 mg/dL — AB (ref 65–99)
GLUCOSE-CAPILLARY: 251 mg/dL — AB (ref 65–99)
Glucose-Capillary: 251 mg/dL — ABNORMAL HIGH (ref 65–99)
Glucose-Capillary: 231 mg/dL — ABNORMAL HIGH (ref 65–99)

## 2016-01-04 LAB — BLOOD GAS, ARTERIAL
ACID-BASE DEFICIT: 8.2 mmol/L — AB (ref 0.0–2.0)
ACID-BASE DEFICIT: 9.8 mmol/L — AB (ref 0.0–2.0)
Acid-base deficit: 9.3 mmol/L — ABNORMAL HIGH (ref 0.0–2.0)
BICARBONATE: 21.6 meq/L (ref 20.0–24.0)
BICARBONATE: 20.1 meq/L (ref 20.0–24.0)
Bicarbonate: 18.7 mEq/L — ABNORMAL LOW (ref 20.0–24.0)
DRAWN BY: 308601
DRAWN BY: 441261
DRAWN BY: 441261
FIO2: 0.7
FIO2: 0.6
FIO2: 0.6
MECHVT: 410 mL
MECHVT: 410 mL
MECHVT: 450 mL
O2 SAT: 94.7 %
O2 Saturation: 86.9 %
O2 Saturation: 93.8 %
PEEP/CPAP: 10 cmH2O
PEEP/CPAP: 10 cmH2O
PEEP: 10 cmH2O
PH ART: 7.226 — AB (ref 7.350–7.450)
PO2 ART: 96.6 mmHg (ref 80.0–100.0)
Patient temperature: 37
Patient temperature: 98.6
Patient temperature: 98.3
RATE: 26 resp/min
RATE: 33 resp/min
RATE: 33 resp/min
TCO2: 17.9 mmol/L (ref 0–100)
TCO2: 20.5 mmol/L (ref 0–100)
TCO2: 19.6 mmol/L (ref 0–100)
pCO2 arterial: 46.8 mmHg — ABNORMAL HIGH (ref 35.0–45.0)
pCO2 arterial: 69.8 mmHg (ref 35.0–45.0)
pCO2 arterial: 64.3 mmHg (ref 35.0–45.0)
pH, Arterial: 7.117 — CL (ref 7.350–7.450)
pH, Arterial: 7.12 — CL (ref 7.350–7.450)
pO2, Arterial: 73.9 mmHg — ABNORMAL LOW (ref 80.0–100.0)
pO2, Arterial: 91.3 mmHg (ref 80.0–100.0)

## 2016-01-04 LAB — BASIC METABOLIC PANEL
Anion gap: 10 (ref 5–15)
Anion gap: 7 (ref 5–15)
BUN: 43 mg/dL — AB (ref 6–20)
BUN: 44 mg/dL — ABNORMAL HIGH (ref 6–20)
CALCIUM: 8.8 mg/dL — AB (ref 8.9–10.3)
CALCIUM: 8.1 mg/dL — AB (ref 8.9–10.3)
CO2: 18 mmol/L — ABNORMAL LOW (ref 22–32)
CO2: 21 mmol/L — AB (ref 22–32)
CREATININE: 2.33 mg/dL — AB (ref 0.61–1.24)
CREATININE: 2.7 mg/dL — AB (ref 0.61–1.24)
Chloride: 102 mmol/L (ref 101–111)
Chloride: 106 mmol/L (ref 101–111)
GFR calc Af Amer: 30 mL/min — ABNORMAL LOW (ref >60)
GFR calc non Af Amer: 21 mL/min — ABNORMAL LOW (ref >60)
GFR, EST AFRICAN AMERICAN: 25 mL/min — AB (ref >60)
GFR, EST NON AFRICAN AMERICAN: 26 mL/min — AB (ref >60)
GLUCOSE: 300 mg/dL — AB (ref 65–99)
GLUCOSE: 225 mg/dL — AB (ref 65–99)
Potassium: 5.5 mmol/L — ABNORMAL HIGH (ref 3.5–5.1)
Potassium: 4.7 mmol/L (ref 3.5–5.1)
Sodium: 130 mmol/L — ABNORMAL LOW (ref 135–145)
Sodium: 134 mmol/L — ABNORMAL LOW (ref 135–145)

## 2016-01-04 LAB — BLOOD CULTURE ID PANEL (REFLEXED)
Acinetobacter baumannii: NOT DETECTED
CANDIDA PARAPSILOSIS: NOT DETECTED
CANDIDA TROPICALIS: NOT DETECTED
CARBAPENEM RESISTANCE: NOT DETECTED
Candida albicans: NOT DETECTED
Candida glabrata: NOT DETECTED
Candida krusei: NOT DETECTED
ENTEROCOCCUS SPECIES: NOT DETECTED
Enterobacter cloacae complex: NOT DETECTED
Enterobacteriaceae species: NOT DETECTED
Escherichia coli: NOT DETECTED
Haemophilus influenzae: NOT DETECTED
KLEBSIELLA PNEUMONIAE: NOT DETECTED
Klebsiella oxytoca: NOT DETECTED
Listeria monocytogenes: NOT DETECTED
Methicillin resistance: NOT DETECTED
Neisseria meningitidis: NOT DETECTED
PROTEUS SPECIES: NOT DETECTED
Pseudomonas aeruginosa: NOT DETECTED
STAPHYLOCOCCUS AUREUS BCID: NOT DETECTED
STAPHYLOCOCCUS SPECIES: DETECTED — AB
Serratia marcescens: NOT DETECTED
Streptococcus agalactiae: NOT DETECTED
Streptococcus pneumoniae: NOT DETECTED
Streptococcus pyogenes: NOT DETECTED
Streptococcus species: NOT DETECTED
VANCOMYCIN RESISTANCE: NOT DETECTED

## 2016-01-04 LAB — RENAL FUNCTION PANEL
ALBUMIN: 1.9 g/dL — AB (ref 3.5–5.0)
ANION GAP: 6 (ref 5–15)
BUN: 43 mg/dL — AB (ref 6–20)
CO2: 22 mmol/L (ref 22–32)
Calcium: 8.2 mg/dL — ABNORMAL LOW (ref 8.9–10.3)
Chloride: 107 mmol/L (ref 101–111)
Creatinine, Ser: 2.69 mg/dL — ABNORMAL HIGH (ref 0.61–1.24)
GFR, EST AFRICAN AMERICAN: 25 mL/min — AB (ref >60)
GFR, EST NON AFRICAN AMERICAN: 21 mL/min — AB (ref >60)
Glucose, Bld: 220 mg/dL — ABNORMAL HIGH (ref 65–99)
PHOSPHORUS: 6 mg/dL — AB (ref 2.5–4.6)
POTASSIUM: 4.8 mmol/L (ref 3.5–5.1)
Sodium: 135 mmol/L (ref 135–145)

## 2016-01-04 LAB — CMV CULTURE CMVC: SPECIAL REQUESTS: NORMAL

## 2016-01-04 LAB — RESPIRATORY PANEL BY PCR
Adenovirus: NOT DETECTED
BORDETELLA PERTUSSIS-RVPCR: NOT DETECTED
CORONAVIRUS HKU1-RVPPCR: NOT DETECTED
CORONAVIRUS OC43-RVPPCR: NOT DETECTED
Chlamydophila pneumoniae: NOT DETECTED
Coronavirus 229E: NOT DETECTED
Coronavirus NL63: NOT DETECTED
INFLUENZA A H1 2009-RVPPR: NOT DETECTED
INFLUENZA A H1-RVPPCR: NOT DETECTED
Influenza A H3: NOT DETECTED
Influenza A: NOT DETECTED
Influenza B: NOT DETECTED
METAPNEUMOVIRUS-RVPPCR: NOT DETECTED
MYCOPLASMA PNEUMONIAE-RVPPCR: NOT DETECTED
PARAINFLUENZA VIRUS 1-RVPPCR: NOT DETECTED
PARAINFLUENZA VIRUS 2-RVPPCR: NOT DETECTED
PARAINFLUENZA VIRUS 3-RVPPCR: NOT DETECTED
Parainfluenza Virus 4: NOT DETECTED
RESPIRATORY SYNCYTIAL VIRUS-RVPPCR: NOT DETECTED
RHINOVIRUS / ENTEROVIRUS - RVPPCR: NOT DETECTED

## 2016-01-04 LAB — CBC
HCT: 35.2 % — ABNORMAL LOW (ref 39.0–52.0)
Hemoglobin: 11.1 g/dL — ABNORMAL LOW (ref 13.0–17.0)
MCH: 29.8 pg (ref 26.0–34.0)
MCHC: 31.5 g/dL (ref 30.0–36.0)
MCV: 94.6 fL (ref 78.0–100.0)
PLATELETS: 328 10*3/uL (ref 150–400)
RBC: 3.72 MIL/uL — ABNORMAL LOW (ref 4.22–5.81)
RDW: 17.7 % — AB (ref 11.5–15.5)
WBC: 15.1 10*3/uL — ABNORMAL HIGH (ref 4.0–10.5)

## 2016-01-04 LAB — PHOSPHORUS
Phosphorus: 7 mg/dL — ABNORMAL HIGH (ref 2.5–4.6)
Phosphorus: 5.9 mg/dL — ABNORMAL HIGH (ref 2.5–4.6)

## 2016-01-04 LAB — PROTIME-INR
INR: 2.34 — ABNORMAL HIGH (ref 0.00–1.49)
Prothrombin Time: 25.4 seconds — ABNORMAL HIGH (ref 11.6–15.2)

## 2016-01-04 LAB — MAGNESIUM
MAGNESIUM: 1.8 mg/dL (ref 1.7–2.4)
Magnesium: 1.6 mg/dL — ABNORMAL LOW (ref 1.7–2.4)

## 2016-01-04 MED ORDER — MIDAZOLAM HCL 2 MG/2ML IJ SOLN: 2.0000 mg | Freq: Once | INTRAMUSCULAR | Status: DC | PRN

## 2016-01-04 MED ORDER — NEPRO/CARBSTEADY PO LIQD
1000.0000 mL | ORAL | Status: DC
  Administered 2016-01-04: 1000 mL
  Filled 2016-01-04 (×2): qty 1000

## 2016-01-04 MED ORDER — LUBRIFRESH P.M. OP OINT
TOPICAL_OINTMENT | Freq: Three times a day (TID) | OPHTHALMIC | Status: DC
  Administered 2016-01-04 – 2016-01-05 (×3): via OPHTHALMIC
  Filled 2016-01-04: qty 3.5

## 2016-01-04 MED ORDER — FENTANYL BOLUS VIA INFUSION
50.0000 ug | INTRAVENOUS | Status: DC | PRN
  Filled 2016-01-04: qty 50

## 2016-01-04 MED ORDER — PHENYLEPHRINE HCL 10 MG/ML IJ SOLN
30.0000 ug/min | INTRAVENOUS | Status: DC
  Administered 2016-01-04: 100 ug/min via INTRAVENOUS
  Administered 2016-01-05: 45 ug/min via INTRAVENOUS
  Administered 2016-01-06: 60 ug/min via INTRAVENOUS
  Administered 2016-01-06 – 2016-01-07 (×2): 30 ug/min via INTRAVENOUS
  Administered 2016-01-07: 23 ug/min via INTRAVENOUS
  Administered 2016-01-09: 20 ug/min via INTRAVENOUS
  Administered 2016-01-10: 35 ug/min via INTRAVENOUS
  Administered 2016-01-10: 20 ug/min via INTRAVENOUS
  Administered 2016-01-13: 50 ug/min via INTRAVENOUS
  Filled 2016-01-04 (×10): qty 4

## 2016-01-04 MED ORDER — MIDAZOLAM HCL 2 MG/2ML IJ SOLN: 2.0000 mg | Freq: Once | INTRAMUSCULAR | Status: DC

## 2016-01-04 MED ORDER — LINEZOLID 600 MG/300ML IV SOLN
600.0000 mg | Freq: Two times a day (BID) | INTRAVENOUS | Status: DC
  Administered 2016-01-04 – 2016-01-14 (×20): 600 mg via INTRAVENOUS
  Filled 2016-01-04 (×21): qty 300

## 2016-01-04 MED ORDER — SODIUM CHLORIDE 0.9 % IV SOLN
500.0000 mg | Freq: Three times a day (TID) | INTRAVENOUS | Status: DC
  Administered 2016-01-05 (×2): 500 mg via INTRAVENOUS
  Filled 2016-01-04 (×2): qty 0.5

## 2016-01-04 MED ORDER — SODIUM CHLORIDE 0.9 % IV SOLN
3.0000 ug/kg/min | INTRAVENOUS | Status: DC
  Administered 2016-01-04: 3 ug/kg/min via INTRAVENOUS
  Filled 2016-01-04 (×2): qty 20

## 2016-01-04 MED ORDER — PRISMASOL BGK 4/2.5 32-4-2.5 MEQ/L IV SOLN
INTRAVENOUS | Status: DC
  Administered 2016-01-04 – 2016-01-11 (×7): via INTRAVENOUS_CENTRAL
  Filled 2016-01-04 (×12): qty 5000

## 2016-01-04 MED ORDER — PRO-STAT SUGAR FREE PO LIQD
30.0000 mL | Freq: Every day | ORAL | Status: DC
  Administered 2016-01-05: 30 mL
  Filled 2016-01-04: qty 30

## 2016-01-04 MED ORDER — MIDAZOLAM BOLUS VIA INFUSION
2.0000 mg | INTRAVENOUS | Status: DC | PRN
  Filled 2016-01-04: qty 2

## 2016-01-04 MED ORDER — SODIUM CHLORIDE 0.9 % IV SOLN
2.0000 mg/h | INTRAVENOUS | Status: DC
  Administered 2016-01-04: 3 mg/h via INTRAVENOUS
  Administered 2016-01-04: 2 mg/h via INTRAVENOUS
  Administered 2016-01-04: 3 mg/h via INTRAVENOUS
  Filled 2016-01-04 (×2): qty 10

## 2016-01-04 MED ORDER — FENTANYL CITRATE (PF) 100 MCG/2ML IJ SOLN: 100.0000 ug | Freq: Once | INTRAMUSCULAR | Status: DC

## 2016-01-04 MED ORDER — PRISMASOL BGK 4/2.5 32-4-2.5 MEQ/L IV SOLN
INTRAVENOUS | Status: DC
  Administered 2016-01-04 – 2016-01-12 (×15): via INTRAVENOUS_CENTRAL
  Filled 2016-01-04 (×20): qty 5000

## 2016-01-04 MED ORDER — SODIUM CHLORIDE 0.9 % IV SOLN
1.0000 g | Freq: Once | INTRAVENOUS | Status: AC
  Administered 2016-01-04: 1 g via INTRAVENOUS
  Filled 2016-01-04: qty 1

## 2016-01-04 MED ORDER — INSULIN REGULAR HUMAN 100 UNIT/ML IJ SOLN
INTRAMUSCULAR | Status: DC
  Administered 2016-01-04: 2.2 [IU]/h via INTRAVENOUS
  Administered 2016-01-06: 2.7 [IU]/h via INTRAVENOUS
  Filled 2016-01-04 (×2): qty 2.5

## 2016-01-04 MED ORDER — ARTIFICIAL TEARS OP OINT
1.0000 "application " | TOPICAL_OINTMENT | Freq: Three times a day (TID) | OPHTHALMIC | Status: DC
  Filled 2016-01-04: qty 3.5

## 2016-01-04 MED ORDER — FREE WATER
50.0000 mL | Freq: Four times a day (QID) | Status: DC
  Administered 2016-01-04 – 2016-01-05 (×4): 50 mL

## 2016-01-04 MED ORDER — PRISMASOL BGK 4/2.5 32-4-2.5 MEQ/L IV SOLN
INTRAVENOUS | Status: DC
  Administered 2016-01-04 – 2016-01-12 (×67): via INTRAVENOUS_CENTRAL
  Filled 2016-01-04 (×84): qty 5000

## 2016-01-04 MED ORDER — IMIPENEM-CILASTATIN 500 MG IV SOLR
500.0000 mg | Freq: Two times a day (BID) | INTRAVENOUS | Status: DC
  Filled 2016-01-04: qty 500

## 2016-01-04 MED ORDER — SODIUM CHLORIDE 0.9 % IV SOLN: INTRAVENOUS | Status: DC | PRN

## 2016-01-04 MED ORDER — INSULIN ASPART 100 UNIT/ML ~~LOC~~ SOLN
12.0000 [IU] | Freq: Once | SUBCUTANEOUS | Status: AC
  Administered 2016-01-04: 12 [IU] via SUBCUTANEOUS

## 2016-01-04 MED ORDER — CISATRACURIUM BOLUS VIA INFUSION
0.0500 mg/kg | Freq: Once | INTRAVENOUS | Status: AC
  Administered 2016-01-04: 3.6 mg via INTRAVENOUS
  Filled 2016-01-04: qty 4

## 2016-01-04 MED ORDER — ALTEPLASE 2 MG IJ SOLR
2.0000 mg | Freq: Once | INTRAMUSCULAR | Status: DC | PRN
Start: 2016-01-04 — End: 2016-01-17

## 2016-01-04 MED ORDER — HEPARIN SODIUM (PORCINE) 1000 UNIT/ML DIALYSIS: 1000.0000 [IU] | INTRAMUSCULAR | Status: DC | PRN

## 2016-01-04 MED ORDER — HEPARIN SODIUM (PORCINE) 1000 UNIT/ML IJ SOLN
1000.0000 [IU] | Freq: Once | INTRAMUSCULAR | Status: AC
  Administered 2016-01-04: 1200 [IU]
  Filled 2016-01-04: qty 6

## 2016-01-04 MED ORDER — SODIUM CHLORIDE 0.9 % IV SOLN
100.0000 ug/h | INTRAVENOUS | Status: DC
  Administered 2016-01-04 (×2): 300 ug/h via INTRAVENOUS
  Filled 2016-01-04 (×3): qty 50

## 2016-01-04 MED ORDER — LEVOFLOXACIN IN D5W 500 MG/100ML IV SOLN
500.0000 mg | INTRAVENOUS | Status: DC
  Administered 2016-01-04 – 2016-01-10 (×7): 500 mg via INTRAVENOUS
  Filled 2016-01-04 (×7): qty 100

## 2016-01-04 MED ORDER — FENTANYL BOLUS VIA INFUSION
25.0000 ug | INTRAVENOUS | Status: DC | PRN
  Filled 2016-01-04: qty 50

## 2016-01-04 MED ORDER — VANCOMYCIN HCL IN DEXTROSE 750-5 MG/150ML-% IV SOLN
750.0000 mg | INTRAVENOUS | Status: DC
  Filled 2016-01-04: qty 150

## 2016-01-04 MED ORDER — FENTANYL CITRATE (PF) 100 MCG/2ML IJ SOLN: 100.0000 ug | Freq: Once | INTRAMUSCULAR | Status: DC | PRN

## 2016-01-04 NOTE — Progress Notes (Signed)
Pharmacy Antibiotic Note  Curtis Frazier is a 75 y.o. male with PMHx CAD s/p stent, PAF on warfarin s/p recent ablation, CKD-III, DM, diastolic CHF, and RA on chronic prednisone with recent hospitalization for PNA and discharged 7/22,  presents 7/24 from SNF for AMS.  Pharmacy was consulted for Vancomycin and Primaxin dosing.  Bronch performed showing diffuse, thick white creamy pus throughout.  CT shows interlobular septal thickening with bilateral ground-glass attenuation with features compatible with pulmonary edema although superimposed infectious / inflammatory etiology not excluded. ID consulted on 7/25 and Vanc & Primaxin have been D/C'ed.  Zyvox and Levofloxacin to begin and Pharmacy consulted to dose Meropenem.  CRRT was initiated on 7/25  (estimate CrCl ~ 25 -50 ml/min)  7/25 SCr elevated and increased to 2.3, CrCl ~26 ml/min CG (N 27)  Plan:  Vancomycin & Primaxin d/c'ed  Zyvox 600mg  IV q12h per MD  Levofloxacin 500mg  IV q24h per MD  Meropenem 1gm IV x 1 followed by 500mg  IV q8h (last Primaxin dose was charted as given @ 04:33)  F/u renal function, cultures, clinical course  Height: 5\' 8"  (172.7 cm) Weight: 156 lb 12 oz (71.1 kg) IBW/kg (Calculated) : 68.4  Temp (24hrs), Avg:98.8 F (37.1 C), Min:97.7 F (36.5 C), Max:100 F (37.8 C)   Recent Labs Lab 12/29/15 0420 01/03/16 1844 01/03/16 1905 01/04/16 0307  WBC 12.7* 11.6*  --  15.1*  CREATININE 1.60* 1.97*  --  2.33*  LATICACIDVEN  --   --  1.19  --     Estimated Creatinine Clearance: 26.1 mL/min (by C-G formula based on SCr of 2.33 mg/dL).    Allergies  Allergen Reactions  . Ace Inhibitors Other (See Comments)    Causes high K   . Angiotensin Receptor Blockers Other (See Comments)    Causes high K   . Metoprolol Other (See Comments)    May cause elevated K level   . Penicillins Swelling and Rash    Has patient had a PCN reaction causing immediate rash, facial/tongue/throat swelling, SOB or  lightheadedness with hypotension: YES Has patient had a PCN reaction causing severe rash involving mucus membranes or skin necrosis: NO Has patient had a PCN reaction that required hospitalization NO Has patient had a PCN reaction occurring within the last 10 years: NO If all of the above answers are "NO", then may proceed with Cephalosporin use. Pt has tolerated cephalosporins in the past   . Amiodarone Other (See Comments)    12/27/15: Cardiologist discontinued the amiodarone (on PTA) due to  concern for toxicity  (in hospitial with acute hypoxemic respiratory failure)   . Tetracycline Swelling and Rash    Antimicrobials this admission: 7/24 Vancomycin >> 7/25 7/24 Primaxin >> 7/25 7/25 Meropenem >> 7/25 Zyvox >>  7/25 Levofloxacin >>  Dose adjustments this admission:   Microbiology results: 7/24 BCx:  7/24 BAL:  7/24 UCx: 7/24 BCx: 7/24 MRSA PCR: negative 7/24 Resp panel:   Thank you for allowing pharmacy to be a part of this patient's care.  Leone Haven, PharmD 01/04/2016 4:07 PM

## 2016-01-04 NOTE — Procedures (Signed)
Hemodialysis Catheter Insertion Procedure Note CLENNON MACZKO [id number] [birth year]  Procedure: Insertion of Hemodialysis Catheter Indications: Hemodialysis  Procedure Details Consent: Risks of procedure as well as the alternatives and risks of each were explained to the (patient/caregiver).  Consent for procedure obtained.  Time Out: Verified patient identification, verified procedure, site/side was marked, verified correct patient position, special equipment/implants available, medications/allergies/relevent history reviewed, required imaging and test results available.  Performed  Maximum sterile technique was used including antiseptics, cap, gloves, gown, hand hygiene, mask and sheet.  Skin prep: Chlorhexidine; local anesthetic administered  A Trialysis HD catheter was placed in the right internal jugular vein using the Seldinger technique.  Sutured in place, biopatch applied.   Evaluation Blood flow good Complications: No apparent complications Patient tolerated the procedure well. Chest X-ray ordered to verify placement.  CXR: pending.   Procedure performed under direct supervision of Dr. Lake Bells and with ultrasound guidance for real time vessel cannulation.     [name], NP-C [facility] Pgr: [phone] or [phone] 01/04/2016, 11:26 AM

## 2016-01-04 NOTE — Progress Notes (Signed)
PULMONARY / CRITICAL CARE MEDICINE   Name: Curtis Frazier MRN: [id number] DOB: Oct 13, 1940    ADMISSION DATE:  01/03/2016 CONSULTATION DATE:  01/03/2016  REFERRING MD:  Dr. Dayna Barker EDP  CHIEF COMPLAINT:  SOB/AMS  BRIEF: 75 y/o male with a rheumatoid arthritis and a recent admission for acute respiratory failure with hypoxemia presumably due to HCAP was re-ad[facility] to [facility] on 7/24 with recurrent acute on chronic hypoxemic respiratory failure with a picture consistent with ARDS.   SUBJECTIVE: worsening acidosis overnight, off pressors   VITAL SIGNS: BP (!) 99/47   Pulse 79   Temp 99.9 F (37.7 C) (Axillary)   Resp (!) 33   Ht 5\' 8"  (1.727 m)   Wt 156 lb 12 oz (71.1 kg)   SpO2 (!) 88%   BMI 23.83 kg/m   HEMODYNAMICS:    VENTILATOR SETTINGS: Vent Mode: PRVC FiO2 (%):  [50 %-100 %] 70 % Set Rate:  [14 bmp-33 bmp] 33 bmp Vt Set:  [410 mL-550 mL] 410 mL PEEP:  [5 cmH20-10 cmH20] 10 cmH20 Plateau Pressure:  [14 cmH20-25 cmH20] 20 cmH20  INTAKE / OUTPUT: I/O last 3 completed shifts: In: 367 [I.V.:303.7; NG/GT:63.3] Out: -   PHYSICAL EXAMINATION: General:  Sedated heavily on vent HENT: NCAT ETT in place PULM: few crackles, no wheezing, vent supported breaths CV: RRR, no mgr GI: BS+, soft, nontender MSK: normal bulk and tone Neuro: heavily sedated  LABS:  BMET  Recent Labs Lab 12/29/15 0420 01/03/16 1844 01/04/16 0307  NA 130* 131* 130*  K 4.4 4.5 5.5*  CL 95* 101 102  CO2 27 22 18*  BUN 32* 37* 43*  CREATININE 1.60* 1.97* 2.33*  GLUCOSE 113* 138* 300*    Electrolytes  Recent Labs Lab 12/29/15 0420 01/03/16 1844 01/03/16 2045 01/04/16 0307  CALCIUM 9.7 9.3  --  8.8*  MG  --   --  1.5* 1.6*  PHOS  --   --  5.1* 7.0*    CBC  Recent Labs Lab 12/29/15 0420 01/03/16 1844 01/04/16 0307  WBC 12.7* 11.6* 15.1*  HGB 12.0* 11.4* 11.1*  HCT 36.9* 35.8* 35.2*  PLT 350 301 328    Coag's  Recent Labs Lab 01/01/16 0303 01/03/16 2126 01/04/16 0307   INR 2.42* 2.56* 2.34*    Sepsis Markers  Recent Labs Lab 12/30/15 0547 01/03/16 1905  LATICACIDVEN  --  1.19  PROCALCITON 0.38  --     ABG  Recent Labs Lab 01/03/16 2210 01/04/16 0328 01/04/16 0725  PHART 7.337* 7.226* 7.117*  PCO2ART 36.6 46.8* 69.8*  PO2ART 86.5 96.6 73.9*    Liver Enzymes  Recent Labs Lab 12/30/15 0547 01/03/16 1844  AST  --  56*  ALT  --  40  ALKPHOS  --  127*  BILITOT  --  0.8  ALBUMIN 2.1* 2.1*    Cardiac Enzymes  Recent Labs Lab 01/03/16 1844  TROPONINI 0.13*    Glucose  Recent Labs Lab 01/01/16 1135 01/01/16 1629 01/03/16 2329 01/04/16 0404 01/04/16 0408 01/04/16 0701  GLUCAP 202* 159* 228* 358* 339* 271*    Imaging Dg Abd 1 View  Result Date: 01/03/2016 CLINICAL DATA:  OG tube placement EXAM: ABDOMEN - 1 VIEW COMPARISON:  Chest radiograph-earlier same day FINDINGS: Enteric tube tip and side port project over the expected location of the gastric fundus. Large colonic stool burden without definite evidence of enteric obstruction. Post lower thoracic/lumbar paraspinal fusion. Extensive bilateral heterogeneous airspace opacities, similar to chest radiograph performed earlier same date  IMPRESSION: 1. Enteric tip and side port projected the expected location of the gastric fundus. 2. Large colonic stool burden. Electronically Signed   By: Sandi Mariscal M.D.   On: 01/03/2016 22:30  Dg Chest Port 1 View  Result Date: 01/03/2016 CLINICAL DATA:  Status post intubation EXAM: PORTABLE CHEST 1 VIEW COMPARISON:  Film from earlier in the same day FINDINGS: Cardiac shadow is again enlarged. Diffuse chronic changes are again seen bilaterally. An endotracheal tube is now noted 3.6 cm above the carina. No new focal abnormality is seen. IMPRESSION: Endotracheal tube in satisfactory position. The remainder of the exam is stable. Electronically Signed   By: Inez Catalina M.D.   On: 01/03/2016 19:38  Dg Chest Portable 1 View  Result Date:  01/03/2016 CLINICAL DATA:  Hypoxia and shortness of Breath EXAM: PORTABLE CHEST 1 VIEW COMPARISON:  12/29/2015 FINDINGS: Cardiac shadow is enlarged. Diffuse interstitial changes and alveolar changes are again noted. Given some variations in the technical factors of film the overall appearance is stable. No new focal infiltrate is seen. Postsurgical changes are again noted in the lower thoracic spine. IMPRESSION: Likely chronic interstitial and alveolar changes similar to that seen on prior exam. Electronically Signed   By: Inez Catalina M.D.   On: 01/03/2016 18:29    STUDIES:  CTA chest 7/15> No CT evidence for acute pulmonary embolus. Interlobular septal thickening with bilateral ground-glass attenuation. Imaging features are compatible with pulmonary edema although superimposed infectious/inflammatory etiology is not excluded. Bibasilar collapse/ consolidation. Interval progression of mediastinal and right hilar lymphadenopathy, nonspecific.   CULTURES: BC x 2 7/24 > Urine 7/24 > BAL 7/24 > RVP 7/24 > PCP 7/24 >  ANTIBIOTICS: Aztreonam 7/24 > Levaquin 7/24 > Vancomycin 7/24 >  SIGNIFICANT EVENTS: 7/22 discharge to SNF  LINES/TUBES: ETT 7/24 >  DISCUSSION: 75 year old male with baseline RA and COPD and a recent admission for acute respiratory failure with hypoxemia was admitted on 7/24 with ARDS.  DDx includes HCAP, opportunistic infection, or less likely an inflammatory process related to his baseline RA.  As of 7/25 he has worsening multi-organ failure.   ASSESSMENT / PLAN:   PULMONARY A: Acute hypoxemic respiratory failure > see discussion above ARDS > worsening dead space COPD/emphysema HCAP  Mediastinal lymphadenopathy  P:   ARDS protocol VAP prevention Start nimbex  F/u serology panel   CARDIOVASCULAR A:  Hypotension > resolved Chronic diastolic CHF Afib s/p ablation P:  Telemetry  MAP goal > 45mmHg Place CVL Check CVP   RENAL A:   Acute on Chronic  kidney disease with worsening metabolic acidosis P:   Renal consult, will likely need CVVHD given acidosis  GASTROINTESTINAL A:   No acute issues P:   Continue tube feeding Pepcid for SUP  HEMATOLOGIC A:   Warfarin induced coagulopathy P:  Follow CBC Repeat INR in AM  May need heparin, follow INR  INFECTIOUS A:   HCAP  P:   Imipenem Vancomycin Cultures as above RVP  ENDOCRINE A:   Chronic prednisone use  P:   Steroids as above > continue solumedrol for now CBG monitoring and SSI  NEUROLOGIC A:   Acute metabolic encephalopathy due to hypoxemia and medical sedation P:   RASS goal: -5 Fentanyl infusion Start nimbex for    FAMILY  - Updates: daughter updated 7/25 at length  - Inter-disciplinary family meet or Palliative Care meeting due by: 7/30    CC time 40 minutes    Roselie Awkward, MD [place]  PCCM Pager: [phone] Cell: [phone] After 3pm or if no response, call [phone]  01/04/2016 7:57 AM

## 2016-01-04 NOTE — Consult Note (Signed)
   [facility] Inpatient Consult   01/04/2016  Curtis Frazier 11/10/1940 [id number]   [name] is currently active with [facility] for long-term disease management services. He was recently at [facility] prior to admission. Chart reviewed. Noted [name] is on vent in ICU. Will continue to follow. Will make inpatient aware that [name] has been followed by [facility].   Marthenia Rolling, MSN-Ed, RN,BSN [facility] Liaison [phone]

## 2016-01-04 NOTE — Progress Notes (Signed)
[facility] Progress Note Patient Name: Curtis Frazier DOB: [birth year] MRN: [id number]   Date of Service  01/04/2016  HPI/Events of Note  Blood glucose = 339.  eICU Interventions  Will order: 1. Novolog 12 units [place] now instead of ordered Novolog SSI.     Intervention Category Intermediate Interventions: Hyperglycemia - evaluation and treatment  [name] 01/04/2016, 4:20 AM

## 2016-01-04 NOTE — Consult Note (Signed)
Renal Service Consult Note [place] 01/04/2016 Sol Blazing Requesting Physician:  Dr [name]  Reason for Consult:  Acute renal failure HPI: The patient is a 75 y.o. year-old with history of diast CHF, CKD, COPD, PAF w recent ablation, RA on remicade/ pred. Pt admitted 7/15 for HCAP rx'd with 9 days of abx and dc'd to SNF on 7/22.  Sent to [facility] ED from SNF on 7/24 w AMS/ comatose w SpO2 60%, interstitial lung changes on CXR.  Rx'd with bipap and then intubated w dx of probable ARDS.  Has severe acidosis w CO2 retention and acute on CRF (baseline creat 1.8).  Asked to see for RRT.  BP 90's on pressors now, UA negative, not making any urine today.    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  Has been on Humira and pred "probably a long time" for P.A/ RA and PMR.   Before these last two admits was up walking at home.  Wants "everything done" as per several conversations she has had with him over the last couple of months.    Chart review: 2010 DM, HL, hx MI/ psor arthritis with LGIB/ anemia, bleed due to AVM's 2013 Lumbar fusion surgery, new afib, CAD, AS, anemia 2017 Feb 4- 21, 2017 > sepsis, RA, AMS/ LLL PNA, DM2, VDRF, DNR/ pall care encounter, dc'd to [facility] Feb 28- Aug 16, 2015 > acute AMS, EEG / MRI neg, prob d/t hyperNa+, hypotension resolved, afib/ RVR, PMR/RA on chron pred, dc'd to [facility] Mar 7- 23, 2017 > [facility] admit, dc'd to home w [facility] Mar 26- 27, 2017 > chest pain r/o for MI, no cath due to CKD, dc'd home Mar 28- Sep 16, 2015 > chest pain ruled out for MI, chron afib on amio/ dilt/coumadin, CKD baseline creat 1.8, RA/ PMR, mild vol overload rx w lasix, dc'd home w [facility] Jul 5-6, 2017 > atrial flutter ablation procedure, dc'd home Jul 12-22, 2017 > acute resp failure d/t RML/ RLL PNA, plus poss diast HF/ amio toxicity, rx abx, lasix  ROS  N/A   Past Medical History  Past Medical  History:  Diagnosis Date  . Actinic keratosis   . Arthritis    "hips" (12/15/2015)  . Bruises easily   . CAD (coronary artery disease)    a. 2010 s/p MI/Cath/PCI ([facility]): LM 10, LAD 30p, 75m, D1 20, D2 30, LCX 30p, 49m (3.5 x 15 Driver BMS), [id number], RCA 70p, 23m; b. 04/2011 Neg MV;  c. 04/2015 Myoview: very mild apical thinning, no ischemia, EF 65%.  . Carotid arterial disease ([place])    a. 10/2015 Carotid US: bilat 1-39% heterogeneous plaque/stenosis. Nl subclavian arteries bilat.  . Chronic back pain    scoliosis/stenosis/radiculopathy,degenerative disc disease  . Chronic diastolic CHF (congestive heart failure) ([place])    a. 08/2015 Echo: EF 55-60%, mild LVH, no rwma, Gr1 DD, mod AS, mildly dil LA, PASP 4mmHg.  . CKD (chronic kidney disease), stage III    a. 09/2015 AKI in setting of dehydration - seen @ [facility].  Marland Kitchen COPD (chronic obstructive pulmonary disease) ([place]) dx'd 12/10/2015  . Degeneration of intervertebral disc, site unspecified   . Dyspnea on exertion   . Esophageal reflux    takes Omeprazole daily  . Gastritis 12/10  . GI bleed    a. 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  . Hemorrhoids   . History of blood transfusion 2010   "  don't remember why"  . History of colonic polyps   . Hyperlipidemia    a. takes pravachol.  . Hypertensive heart disease    a. takes Metoprolol daily  . Joint pain   . Lumbar stenosis    a. s/p lumbar diskectomy 2017.  . Moderate aortic stenosis    a. 08/2015 Mod AS, valve area (VTI) - 1.5cm^2, (Vmax) - 1.25cm^2; b. 10/2015 TEE EF 55-60%, mod AS.  Marland Kitchen Nephrolithiasis   . Other psoriasis   . Paroxysmal atrial fibrillation ([place])    a. 06/2011 Post-op AF-->converted on amio;  b. 07/2015 TEE/unsuccessful DCCV x 2; c. 09/2015 recurrent AF/Flutter-->successful DCCV; d. 09/2015 Recurrent AF-->10/2015 s/p TEE (EF 55-60%, mod AS, mild MR, sev dil LA w/o LAA thrombus, sev dil RA, mild to mod TR) & DCCV (120 J x 1).  . Personal history of colonic polyps   . PMR (polymyalgia  rheumatica) ([facility]) 01/20/2012   tx by specialist on a long prednisone taper  . Pneumonia 2008; 07/2015  . Psoriasis   . Psoriatic arthropathy ([place])   . Rheumatoid arthritis(714.0)    takes Methotrexate 7pills weekly  . Scoliosis (and kyphoscoliosis), idiopathic   . Skin cancer    "arms; they freeze then off; cut one off left elbow recently" (12/15/2015)  . Sleep apnea    "had it while in hospital in 07/2015" (12/15/2015)  . STEMI (ST elevation myocardial infarction) ([place]) 02/2010   Archie Endo 05/2009  . Type II diabetes mellitus ([place])    Past Surgical History  Past Surgical History:  Procedure Laterality Date  . BACK SURGERY    . CARDIAC CATHETERIZATION  2010  . CARDIOVERSION N/A 08/06/2015   Procedure: CARDIOVERSION;  Surgeon: Skeet Latch, MD;  Location: [place];  Service: Cardiovascular;  Laterality: N/A;  . CARDIOVERSION N/A 09/14/2015   Procedure: CARDIOVERSION;  Surgeon: Jerline Pain, MD;  Location: [place];  Service: Cardiovascular;  Laterality: N/A;  . CARDIOVERSION N/A 11/04/2015   Procedure: CARDIOVERSION;  Surgeon: Sanda Klein, MD;  Location: [facility];  Service: Cardiovascular;  Laterality: N/A;  . carotid doppler  10/12   0-39% R and 60-79% left   . CATARACT EXTRACTION W/ INTRAOCULAR LENS  IMPLANT, BILATERAL Bilateral 2012  . COLONOSCOPY    . CORONARY ANGIOPLASTY WITH STENT PLACEMENT  02/2009   has one stent  . ELECTROPHYSIOLOGIC STUDY N/A 12/15/2015   Procedure: A-Flutter;  Surgeon: Will Meredith Leeds, MD;  Location: [place] CV LAB;  Service: Cardiovascular;  Laterality: N/A;  . LAMINECTOMY THORACIC FUSION POSTERIOR  06/2011   Decompressive laminectomy at L12, L2-3, L4-5; posterior segmental agitation T10-L5 with globus titanium pedicle screws and rods; T10-11, and T11-12, T12-L1, L1-2, L2-3, L3-4, L4-5 posterior lateral arthrodesis with local morselized autograft bone, bone morphogenic protein, Actifuse bone graft extender./notes 07/03/2011  . LITHOTRIPSY   2009  . LUMBAR LAMINECTOMY  07/2015  . POSTERIOR LAMINECTOMY THORACIC SPINE     "fixed the discs"  . SHOULDER OPEN ROTATOR CUFF REPAIR Left 2000  . SKIN CANCER EXCISION     "arms"  . TEE WITHOUT CARDIOVERSION N/A 08/06/2015   Procedure: TRANSESOPHAGEAL ECHOCARDIOGRAM (TEE);  Surgeon: Skeet Latch, MD;  Location: [place];  Service: Cardiovascular;  Laterality: N/A;  . TEE WITHOUT CARDIOVERSION N/A 09/14/2015   Procedure: TRANSESOPHAGEAL ECHOCARDIOGRAM (TEE);  Surgeon: Jerline Pain, MD;  Location: [place];  Service: Cardiovascular;  Laterality: N/A;  . TEE WITHOUT CARDIOVERSION N/A 11/04/2015   Procedure: TRANSESOPHAGEAL ECHOCARDIOGRAM (TEE);  Surgeon: Sanda Klein, MD;  Location: [facility]  ENDOSCOPY;  Service: Cardiovascular;  Laterality: N/A;  . VASECTOMY  1979   Family History  Family History  Problem Relation Age of Onset  . Coronary artery disease Mother   . Diabetes Mother   . CAD Mother   . Coronary artery disease Sister   . Hypertension Sister   . Kidney failure Sister   . CAD Sister   . Diabetes Brother   . Prostate cancer Brother   . Pancreatic cancer Brother   . Diabetes Brother   . Anesthesia problems Neg Hx   . Hypotension Neg Hx   . Malignant hyperthermia Neg Hx   . Pseudochol deficiency Neg Hx    Social History  reports that he quit smoking about 7 years ago. His smoking use included Cigarettes. He has a 79.50 pack-year smoking history. He has never used smokeless tobacco. He reports that he drinks alcohol. He reports that he does not use drugs. Allergies  Allergies  Allergen Reactions  . Ace Inhibitors Other (See Comments)    Causes high K   . Angiotensin Receptor Blockers Other (See Comments)    Causes high K   . Metoprolol Other (See Comments)    May cause elevated K level   . Penicillins Swelling and Rash    Has patient had a PCN reaction causing immediate rash, facial/tongue/throat swelling, SOB or lightheadedness with hypotension: YES Has  patient had a PCN reaction causing severe rash involving mucus membranes or skin necrosis: NO Has patient had a PCN reaction that required hospitalization NO Has patient had a PCN reaction occurring within the last 10 years: NO If all of the above answers are "NO", then may proceed with Cephalosporin use. Pt has tolerated cephalosporins in the past   . Amiodarone Other (See Comments)    12/27/15: Cardiologist discontinued the amiodarone (on PTA) due to  concern for toxicity  (in hospitial with acute hypoxemic respiratory failure)   . Tetracycline Swelling and Rash   Home medications Prior to Admission medications   Medication Sig Start Date End Date Taking? Authorizing Provider  acetaminophen (TYLENOL) 325 MG tablet Take 650 mg by mouth every 6 (six) hours as needed (Pain scale of 1-4 or temp > 101).   Yes Historical Provider, MD  albuterol (PROVENTIL HFA;VENTOLIN HFA) 108 (90 Base) MCG/ACT inhaler Inhale 2 puffs into the lungs every 6 (six) hours as needed for wheezing or shortness of breath. 11/10/15  Yes Abner Greenspan, MD  albuterol (PROVENTIL) (2.5 MG/3ML) 0.083% nebulizer solution Take 3 mLs (2.5 mg total) by nebulization every 4 (four) hours as needed for wheezing or shortness of breath. 01/01/16  Yes Hosie Poisson, MD  calcium-vitamin D (OSCAL WITH D) 500-200 MG-UNIT tablet Take 1 tablet by mouth daily with breakfast.   Yes Historical Provider, MD  diltiazem (CARDIZEM CD) 240 MG 24 hr capsule TAKE 1 CAPSULE BY MOUTH DAILY 10/14/15  Yes Abner Greenspan, MD  docusate sodium (COLACE) 100 MG capsule Take 100 mg by mouth 2 (two) times daily. Reported on 09/22/2015   Yes Historical Provider, MD  ferrous sulfate 325 (65 FE) MG tablet Take 1 tablet (325 mg total) by mouth 3 (three) times daily with meals. 10/26/15  Yes Marne A Tower, MD  glucose blood (ONE TOUCH ULTRA TEST) test strip Check blood sugar three times daily and as directed. Dx E11.8 diabetes that is poorly controlled 11/15/15  Yes Abner Greenspan,  MD  insulin aspart (NOVOLOG) 100 UNIT/ML injection CBG 70 - 120: 0 units CBG  121 - 150: 2 units CBG 151 - 200: 3 units CBG 201 - 250: 5 units CBG 251 - 300: 8 units CBG 301 - 350: 11 units CBG 351 - 400: 15 units Patient taking differently: Inject 0-15 Units into the skin 4 (four) times daily as needed for high blood sugar (sliding scale). CBG 70 - 120: 0 units CBG 121 - 150: 2 units CBG 151 - 200: 3 units CBG 201 - 250: 5 units CBG 251 - 300: 8 units CBG 301 - 350: 11 units CBG 351 - 400: 15 units 01/01/16  Yes Hosie Poisson, MD  insulin detemir (LEVEMIR) 100 UNIT/ML injection Inject 0.15 mLs (15 Units total) into the skin daily. 01/01/16  Yes Hosie Poisson, MD  pantoprazole (PROTONIX) 40 MG tablet Take 1 tablet (40 mg total) by mouth daily. 10/11/15  Yes Tonia Ghent, MD  pravastatin (PRAVACHOL) 20 MG tablet Take 1 tablet (20 mg total) by mouth every evening. 11/10/15  Yes Rogelia Mire, NP  predniSONE (DELTASONE) 5 MG tablet Take 1 tablet (5 mg total) by mouth daily with breakfast. 12/03/15  Yes Abner Greenspan, MD  thiamine 100 MG tablet Take 1 tablet (100 mg total) by mouth daily. 10/11/15  Yes Tonia Ghent, MD  tiotropium (SPIRIVA) 18 MCG inhalation capsule Place 1 capsule (18 mcg total) into inhaler and inhale daily. 12/07/15  Yes Jearld Fenton, NP  warfarin (COUMADIN) 2.5 MG tablet Take 2.5 mg by mouth daily. Give every Tuesday, Thursday, Saturday and Sunday   Yes Historical Provider, MD  warfarin (COUMADIN) 5 MG tablet Take 5 mg by mouth daily. Give Monday, Wednesday and Friday   Yes Historical Provider, MD   Liver Function Tests  Recent Labs Lab 12/30/15 0547 01/03/16 1844  AST  --  56*  ALT  --  40  ALKPHOS  --  127*  BILITOT  --  0.8  PROT  --  7.1  ALBUMIN 2.1* 2.1*   No results for input(s): LIPASE, AMYLASE in the last 168 hours. CBC  Recent Labs Lab 12/29/15 0420 01/03/16 1844 01/04/16 0307  WBC 12.7* 11.6* 15.1*  NEUTROABS  --  9.2*  --   HGB 12.0* 11.4*  11.1*  HCT 36.9* 35.8* 35.2*  MCV 91.1 92.5 94.6  PLT 350 301 [id number]   Basic Metabolic Panel  Recent Labs Lab 12/29/15 0420 01/03/16 1844 01/03/16 2045 01/04/16 0307  NA 130* 131*  --  130*  K 4.4 4.5  --  5.5*  CL 95* 101  --  102  CO2 27 22  --  18*  GLUCOSE 113* 138*  --  300*  BUN 32* 37*  --  43*  CREATININE 1.60* 1.97*  --  2.33*  CALCIUM 9.7 9.3  --  8.8*  PHOS  --   --  5.1* 7.0*   Iron/TIBC/Ferritin/ %Sat    Component Value Date/Time   IRON 24 (L) 08/13/2015 0430   TIBC 258 08/13/2015 0430   FERRITIN 147 08/13/2015 0430   IRONPCTSAT 9 (L) 08/13/2015 0430    Vitals:   01/04/16 0600 01/04/16 0700 01/04/16 0720 01/04/16 0800  BP: (!) 100/49 (!) 99/47 (!) 99/47   Pulse:   79   Resp: (!) 26 (!) 33 (!) 33   Temp:    100 F (37.8 C)  TempSrc:    Axillary  SpO2: 90% (!) 89% (!) 88%   Weight:      Height:   5\' 8"  (1.727 m)  Exam Gen on vent, unresponsive, sedated No rash, cyanosis or gangrene Sclera anicteric, throat w ETT  No jvd or bruits Chest clear ant and lat RRR no MRG Abd soft nondistended no ascites or mass GU normal male w Foley MS no joint effusions or deformity Ext no LE or UE edema, no wounds or ulcers Neuro as above  CT chest > underlying emphysema w bilat patchy infiltrates/ ground glass changes, LL > UL's CXR 7.25 > patchy bilat infiltrates, somewhat better ABG > 7.11/ 69/ 74    Na 130  K 5.5  CO2 18  BUN 43  Cr 2.33  CA 8.8  Alb 2.1  Mg 1.5   LFT's ok  WBC 19k  Hb 11  plt 328  Trop 0.13  BNP 276   LA 1.19 CRP 19 INR 2.3   UA - negative  Assessment: 1  Acute on CRF3 - not making urine, severe resp acidosis w/o ability to compensate due to AKI.  Agree w CRRT.   2  CKD creat 1.8 baseline 3  Recurrent resp failure - on vent, CO2 retention and hypoxemia, FiO2 70% 4  Recurrent PNA vs other (ILI, ARDS) 5  COPD underlying 6  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 7  Hx back surg x 2   Plan - CRRT  Kelly Splinter  MD [facility] pager [phone]    cell [phone] 01/04/2016, 9:53 AM

## 2016-01-04 NOTE — Consult Note (Signed)
[name] for Infectious Disease  Date of Admission:  01/03/2016  Date of Consult:  01/04/2016  Reason for Consult: HCAP Referring Physician: Harland Dingwall  Impression/Recommendation HCAP BCx, Bronch Cx pending.  Would change vanco to zyvox (better lung penetration) Would change imipenem to merrem (less seizure risk) Would add levaquin (atypical coverage).  Check legionella Ag Check TTE On steroids.   CKD Prev Cr 1.6, now up to 2.33 and pH 7.120.  Appreciate renal f/u.   Comment- His hx includes RA, could this be autoimmune process?  Thank you so much for this interesting consult,   Bobby Rumpf (pager) [phone] [email or web address]  Curtis Frazier is an 75 y.o. male.  HPI: 75 yo M with hx of COPD, who was adm 7-15 with HCAP and was treated for 9 day with (cefepime/vanco). Legionaella U Ag (-). He returned to SNF.  He returns to [facility] on 7-24 with AMS (comatose) and hypoxic respiratory failure. He required bipap in ED, was hypotensive. He was afebrile.  His CXR was felt to be consistent with his prev CXR, chronic disease pattern.  He was started on imipenem/vanco and steroids. He required steroids. He required intubation overnight and was felt to have ARDS.    Of note he has prev been on humira, prednisone, methotrexate, for RA, PMR. Family is unclear when this last was given.   Past Medical History:  Diagnosis Date  . Actinic keratosis   . Arthritis    "hips" (12/15/2015)  . Bruises easily   . CAD (coronary artery disease)    a. 2010 s/p MI/Cath/PCI ([facility]): LM 10, LAD 30p, 33m D1 20, D2 30, LCX 30p, 924m3.5 x 15 Driver BMS), 6094RRCA 70p, 304m. 04/2011 Neg MV;  c. 04/2015 Myoview: very mild apical thinning, no ischemia, EF 65%.  . Carotid arterial disease (HCCD'[place]  a. 10/2015 Carotid US:[place] 1-39% heterogeneous plaque/stenosis. Nl subclavian arteries bilat.  . Chronic back pain    scoliosis/stenosis/radiculopathy,degenerative disc disease  . Chronic  diastolic CHF (congestive heart failure) ([facility]  a. 08/2015 Echo: EF 55-60%, mild LVH, no rwma, Gr1 DD, mod AS, mildly dil LA, PASP 33m59m  . CKD (chronic kidney disease), stage III    a. 09/2015 AKI in setting of dehydration - seen @ [facility].  . COMarland KitchenD (chronic obstructive pulmonary disease) (HCC)Rufus'd 12/10/2015  . Degeneration of intervertebral disc, site unspecified   . Dyspnea on exertion   . Esophageal reflux    takes Omeprazole daily  . Gastritis 12/10  . GI bleed    a. 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  . Hemorrhoids   . History of blood transfusion 2010   "don't remember why"  . History of colonic polyps   . Hyperlipidemia    a. takes pravachol.  . Hypertensive heart disease    a. takes Metoprolol daily  . Joint pain   . Lumbar stenosis    a. s/p lumbar diskectomy 2017.  . Moderate aortic stenosis    a. 08/2015 Mod AS, valve area (VTI) - 1.5cm^2, (Vmax) - 1.25cm^2; b. 10/2015 TEE EF 55-60%, mod AS.  . NeMarland Kitchenhrolithiasis   . Other psoriasis   . Paroxysmal atrial fibrillation (HCC)[place] a. 06/2011 Post-op AF-->converted on amio;  b. 07/2015 TEE/unsuccessful DCCV x 2; c. 09/2015 recurrent AF/Flutter-->successful DCCV; d. 09/2015 Recurrent AF-->10/2015 s/p TEE (EF 55-60%, mod AS, mild MR, sev dil LA w/o LAA thrombus, sev dil RA, mild to mod TR) & DCCV (120  J x 1).  . Personal history of colonic polyps   . PMR (polymyalgia rheumatica) (HCC) 01/20/2012   tx by specialist on a long prednisone taper  . Pneumonia 2008; 07/2015  . Psoriasis   . Psoriatic arthropathy ([place])   . Rheumatoid arthritis(714.0)    takes Methotrexate 7pills weekly  . Scoliosis (and kyphoscoliosis), idiopathic   . Skin cancer    "arms; they freeze then off; cut one off left elbow recently" (12/15/2015)  . Sleep apnea    "had it while in hospital in 07/2015" (12/15/2015)  . STEMI (ST elevation myocardial infarction) ([place]) 02/2010   Archie Endo 05/2009  . Type II diabetes mellitus ([place])     Past Surgical History:  Procedure  Laterality Date  . BACK SURGERY    . CARDIAC CATHETERIZATION  2010  . CARDIOVERSION N/A 08/06/2015   Procedure: CARDIOVERSION;  Surgeon: Skeet Latch, MD;  Location: [place];  Service: Cardiovascular;  Laterality: N/A;  . CARDIOVERSION N/A 09/14/2015   Procedure: CARDIOVERSION;  Surgeon: Jerline Pain, MD;  Location: [place];  Service: Cardiovascular;  Laterality: N/A;  . CARDIOVERSION N/A 11/04/2015   Procedure: CARDIOVERSION;  Surgeon: Sanda Klein, MD;  Location: [facility];  Service: Cardiovascular;  Laterality: N/A;  . carotid doppler  10/12   0-39% R and 60-79% left   . CATARACT EXTRACTION W/ INTRAOCULAR LENS  IMPLANT, BILATERAL Bilateral 2012  . COLONOSCOPY    . CORONARY ANGIOPLASTY WITH STENT PLACEMENT  02/2009   has one stent  . ELECTROPHYSIOLOGIC STUDY N/A 12/15/2015   Procedure: A-Flutter;  Surgeon: Will Meredith Leeds, MD;  Location: [place] [facility];  Service: Cardiovascular;  Laterality: N/A;  . LAMINECTOMY THORACIC FUSION POSTERIOR  06/2011   Decompressive laminectomy at L12, L2-3, L4-5; posterior segmental agitation T10-L5 with globus titanium pedicle screws and rods; T10-11, and T11-12, T12-L1, L1-2, L2-3, L3-4, L4-5 posterior lateral arthrodesis with local morselized autograft bone, bone morphogenic protein, Actifuse bone graft extender./notes 07/03/2011  . LITHOTRIPSY  2009  . LUMBAR LAMINECTOMY  07/2015  . POSTERIOR LAMINECTOMY THORACIC SPINE     "fixed the discs"  . SHOULDER OPEN ROTATOR CUFF REPAIR Left 2000  . SKIN CANCER EXCISION     "arms"  . TEE WITHOUT CARDIOVERSION N/A 08/06/2015   Procedure: TRANSESOPHAGEAL ECHOCARDIOGRAM (TEE);  Surgeon: Skeet Latch, MD;  Location: [place];  Service: Cardiovascular;  Laterality: N/A;  . TEE WITHOUT CARDIOVERSION N/A 09/14/2015   Procedure: TRANSESOPHAGEAL ECHOCARDIOGRAM (TEE);  Surgeon: Jerline Pain, MD;  Location: [place];  Service: Cardiovascular;  Laterality: N/A;  . TEE WITHOUT CARDIOVERSION N/A  11/04/2015   Procedure: TRANSESOPHAGEAL ECHOCARDIOGRAM (TEE);  Surgeon: Sanda Klein, MD;  Location: [facility] ENDOSCOPY;  Service: Cardiovascular;  Laterality: N/A;  . VASECTOMY  1979     Allergies  Allergen Reactions  . Ace Inhibitors Other (See Comments)    Causes high K   . Angiotensin Receptor Blockers Other (See Comments)    Causes high K   . Metoprolol Other (See Comments)    May cause elevated K level   . Penicillins Swelling and Rash    Has patient had a PCN reaction causing immediate rash, facial/tongue/throat swelling, SOB or lightheadedness with hypotension: YES Has patient had a PCN reaction causing severe rash involving mucus membranes or skin necrosis: NO Has patient had a PCN reaction that required hospitalization NO Has patient had a PCN reaction occurring within the last 10 years: NO If all of the above answers are "NO", then may proceed with Cephalosporin  use. Pt has tolerated cephalosporins in the past   . Amiodarone Other (See Comments)    12/27/15: Cardiologist discontinued the amiodarone (on PTA) due to  concern for toxicity  (in hospitial with acute hypoxemic respiratory failure)   . Tetracycline Swelling and Rash    Medications:  Scheduled: . antiseptic oral rinse  7 mL Mouth Rinse QID  . artificial tears   Both Eyes Q8H  . chlorhexidine gluconate (SAGE KIT)  15 mL Mouth Rinse BID  . famotidine (PEPCID) IV  20 mg Intravenous Q24H  . feeding supplement (NEPRO CARB STEADY)  1,000 mL Per Tube Q24H  . [START ON 01/05/2016] feeding supplement (PRO-STAT SUGAR FREE 64)  30 mL Per Tube Daily  . fentaNYL (SUBLIMAZE) injection  100 mcg Intravenous Once  . free water  50 mL Per Tube Q6H  . imipenem-cilastatin  500 mg Intravenous Q12H  . ipratropium-albuterol  3 mL Nebulization Q6H  . methylPREDNISolone (SOLU-MEDROL) injection  40 mg Intravenous Q12H  . midazolam  2 mg Intravenous Once  . vancomycin  750 mg Intravenous Q24H    Abtx:  Anti-infectives    Start      Dose/Rate Route Frequency Ordered Stop   01/04/16 2000  vancomycin (VANCOCIN) IVPB 1000 mg/200 mL premix  Status:  Discontinued     1,000 mg 200 mL/hr over 60 Minutes Intravenous Every 24 hours 01/03/16 2016 01/04/16 0730   01/04/16 2000  vancomycin (VANCOCIN) IVPB 750 mg/150 ml premix     750 mg 150 mL/hr over 60 Minutes Intravenous Every 24 hours 01/04/16 0730     01/04/16 1800  imipenem-cilastatin (PRIMAXIN) 500 mg in sodium chloride 0.9 % 100 mL IVPB     500 mg 200 mL/hr over 30 Minutes Intravenous Every 12 hours 01/04/16 0730     01/04/16 0400  imipenem-cilastatin (PRIMAXIN) 500 mg in sodium chloride 0.9 % 100 mL IVPB  Status:  Discontinued     500 mg 200 mL/hr over 30 Minutes Intravenous Every 8 hours 01/03/16 2016 01/04/16 0730   01/03/16 2000  imipenem-cilastatin (PRIMAXIN) 500 mg in sodium chloride 0.9 % 100 mL IVPB     500 mg 200 mL/hr over 30 Minutes Intravenous STAT 01/03/16 1953 01/03/16 2114   01/03/16 1830  vancomycin (VANCOCIN) IVPB 1000 mg/200 mL premix     1,000 mg 200 mL/hr over 60 Minutes Intravenous  Once 01/03/16 1819 01/03/16 2043      Total days of antibiotics: vanco/imipenem          Social History:  reports that he quit smoking about 7 years ago. His smoking use included Cigarettes. He has a 79.50 pack-year smoking history. He has never used smokeless tobacco. He reports that he drinks alcohol. He reports that he does not use drugs.  Family History  Problem Relation Age of Onset  . Coronary artery disease Mother   . Diabetes Mother   . CAD Mother   . Coronary artery disease Sister   . Hypertension Sister   . Kidney failure Sister   . CAD Sister   . Diabetes Brother   . Prostate cancer Brother   . Pancreatic cancer Brother   . Diabetes Brother   . Anesthesia problems Neg Hx   . Hypotension Neg Hx   . Malignant hyperthermia Neg Hx   . Pseudochol deficiency Neg Hx     History obtained from chart review and unobtainable from patient due to  intbx  On levophed, vent CRRT  Blood pressure (!) 87/39, pulse 79,  temperature 98.3 F (36.8 C), temperature source Axillary, resp. rate (!) 33, height 5' 8"  (1.727 m), weight 71.1 kg (156 lb 12 oz), SpO2 93 %. General appearance: sedated, on vent Eyes: negative findings: pupils = Throat: ET tube Neck: R IJ Lungs: diminished breath sounds anterior - bilateral and rhonchi anterior - bilateral Heart: regular rate and rhythm Abdomen: abnormal findings:  distended and hypoactive bowel sounds Extremities: edema none   Results for orders placed or performed during the hospital encounter of 01/03/16 (from the past 48 hour(s))  MRSA PCR Screening     Status: None   Collection Time: 01/03/16  5:54 PM  Result Value Ref Range   MRSA by PCR NEGATIVE NEGATIVE    Comment:        The GeneXpert MRSA Assay (FDA approved for NASAL specimens only), is one component of a comprehensive MRSA colonization surveillance program. It is not intended to diagnose MRSA infection nor to guide or monitor treatment for MRSA infections.   Comprehensive metabolic panel     Status: Abnormal   Collection Time: 01/03/16  6:44 PM  Result Value Ref Range   Sodium 131 (L) 135 - 145 mmol/L   Potassium 4.5 3.5 - 5.1 mmol/L   Chloride 101 101 - 111 mmol/L   CO2 22 22 - 32 mmol/L   Glucose, Bld 138 (H) 65 - 99 mg/dL   BUN 37 (H) 6 - 20 mg/dL   Creatinine, Ser 1.97 (H) 0.61 - 1.24 mg/dL   Calcium 9.3 8.9 - 10.3 mg/dL   Total Protein 7.1 6.5 - 8.1 g/dL   Albumin 2.1 (L) 3.5 - 5.0 g/dL   AST 56 (H) 15 - 41 U/L   ALT 40 17 - 63 U/L   Alkaline Phosphatase 127 (H) 38 - 126 U/L   Total Bilirubin 0.8 0.3 - 1.2 mg/dL   GFR calc non Af Amer 31 (L) >60 mL/min   GFR calc Af Amer 36 (L) >60 mL/min    Comment: (NOTE) The eGFR has been calculated using the CKD EPI equation. This calculation has not been validated in all clinical situations. eGFR's persistently <60 mL/min signify possible Chronic Kidney Disease.     Anion gap 8 5 - 15  CBC WITH DIFFERENTIAL     Status: Abnormal   Collection Time: 01/03/16  6:44 PM  Result Value Ref Range   WBC 11.6 (H) 4.0 - 10.5 K/uL   RBC 3.87 (L) 4.22 - 5.81 MIL/uL   Hemoglobin 11.4 (L) 13.0 - 17.0 g/dL   HCT 35.8 (L) 39.0 - 52.0 %   MCV 92.5 78.0 - 100.0 fL   MCH 29.5 26.0 - 34.0 pg   MCHC 31.8 30.0 - 36.0 g/dL   RDW 17.9 (H) 11.5 - 15.5 %   Platelets 301 150 - 400 K/uL   Neutrophils Relative % 77 %   Neutro Abs 9.2 (H) 1.7 - 7.7 K/uL   Lymphocytes Relative 14 %   Lymphs Abs 1.6 0.7 - 4.0 K/uL   Monocytes Relative 8 %   Monocytes Absolute 1.0 0.1 - 1.0 K/uL   Eosinophils Relative 0 %   Eosinophils Absolute 0.0 0.0 - 0.7 K/uL   Basophils Relative 0 %   Basophils Absolute 0.0 0.0 - 0.1 K/uL   Smear Review MORPHOLOGY UNREMARKABLE   Blood Culture (routine x 2)     Status: None (Preliminary result)   Collection Time: 01/03/16  6:44 PM  Result Value Ref Range   Specimen Description  BLOOD LEFT ANTECUBITAL Performed at [facility]    Special Requests BOTTLES DRAWN AEROBIC AND ANAEROBIC 5CC    Culture PENDING    Report Status PENDING   Brain natriuretic peptide     Status: Abnormal   Collection Time: 01/03/16  6:44 PM  Result Value Ref Range   B Natriuretic Peptide 276.7 (H) 0.0 - 100.0 pg/mL  Troponin I     Status: Abnormal   Collection Time: 01/03/16  6:44 PM  Result Value Ref Range   Troponin I 0.13 (HH) <0.03 ng/mL    Comment: CRITICAL RESULT CALLED TO, READ BACK BY AND VERIFIED WITH: RAND,K AT 1937 ON 01/03/16 BY MOSLEY,J   I-Stat CG4 Lactic Acid, ED  (not at  [facility])     Status: None   Collection Time: 01/03/16  7:05 PM  Result Value Ref Range   Lactic Acid, Venous 1.19 0.5 - 1.9 mmol/L  Culture, respiratory (NON-Expectorated)     Status: None (Preliminary result)   Collection Time: 01/03/16  7:52 PM  Result Value Ref Range   Specimen Description THROAT    Special Requests NONE    Gram Stain      ABUNDANT WBC PRESENT,BOTH PMN AND  MONONUCLEAR RARE GRAM POSITIVE COCCI IN PAIRS RARE GRAM VARIABLE ROD RARE YEAST    Culture      TOO YOUNG TO READ Performed at [facility]    Report Status PENDING   Blood gas, arterial     Status: Abnormal   Collection Time: 01/03/16  8:10 PM  Result Value Ref Range   FIO2 1.00    Delivery systems VENTILATOR    Mode PRESSURE REGULATED VOLUME CONTROL    VT 550 mL   LHR 24 resp/min   Peep/cpap 8.0 cm H20   pH, Arterial 7.349 (L) 7.350 - 7.450   pCO2 arterial 39.2 35.0 - 45.0 mmHg   pO2, Arterial 112 (H) 80.0 - 100.0 mmHg   Bicarbonate 21.1 20.0 - 24.0 mEq/L   TCO2 19.8 0 - 100 mmol/L   Acid-base deficit 3.7 (H) 0.0 - 2.0 mmol/L   O2 Saturation 97.0 %   Patient temperature 98.1    Collection site RIGHT RADIAL    Drawn by [id number]    Sample type ARTERIAL    Allens test (pass/fail) PASS PASS  Culture, blood (routine x 2)     Status: None (Preliminary result)   Collection Time: 01/03/16  8:45 PM  Result Value Ref Range   Specimen Description BLOOD RIGHT ANTECUBITAL    Special Requests BOTTLES DRAWN AEROBIC AND ANAEROBIC 5CC    Culture PENDING    Report Status PENDING   Sedimentation rate     Status: Abnormal   Collection Time: 01/03/16  8:45 PM  Result Value Ref Range   Sed Rate 80 (H) 0 - 16 mm/hr  C-reactive protein     Status: Abnormal   Collection Time: 01/03/16  8:45 PM  Result Value Ref Range   CRP 19.7 (H) <1.0 mg/dL    Comment: Performed at [facility]  Magnesium     Status: Abnormal   Collection Time: 01/03/16  8:45 PM  Result Value Ref Range   Magnesium 1.5 (L) 1.7 - 2.4 mg/dL  Phosphorus     Status: Abnormal   Collection Time: 01/03/16  8:45 PM  Result Value Ref Range   Phosphorus 5.1 (H) 2.5 - 4.6 mg/dL  Protime-INR     Status: Abnormal   Collection Time: 01/03/16  9:26 PM  Result Value Ref Range   Prothrombin Time 27.2 (H) 11.6 - 15.2 seconds   INR 2.56 (H) 0.00 - 1.49  Blood gas, arterial     Status: Abnormal   Collection Time:  01/03/16 10:10 PM  Result Value Ref Range   FIO2 0.70    Delivery systems VENTILATOR    Mode PRESSURE REGULATED VOLUME CONTROL    VT 410 mL   LHR 26 resp/min   Peep/cpap 10.0 cm H20   pH, Arterial 7.337 (L) 7.350 - 7.450   pCO2 arterial 36.6 35.0 - 45.0 mmHg   pO2, Arterial 86.5 80.0 - 100.0 mmHg   Bicarbonate 19.2 (L) 20.0 - 24.0 mEq/L   TCO2 18.0 0 - 100 mmol/L   Acid-base deficit 5.6 (H) 0.0 - 2.0 mmol/L   O2 Saturation 94.8 %   Patient temperature 97.7    Collection site RIGHT RADIAL    Drawn by [id number]    Sample type ARTERIAL    Allens test (pass/fail) PASS PASS  Urinalysis, Routine w reflex microscopic (not at [facility])     Status: Abnormal   Collection Time: 01/03/16 11:15 PM  Result Value Ref Range   Color, Urine YELLOW YELLOW   APPearance CLOUDY (A) CLEAR   Specific Gravity, Urine 1.018 1.005 - 1.030   pH 5.0 5.0 - 8.0   Glucose, UA NEGATIVE NEGATIVE mg/dL   Hgb urine dipstick NEGATIVE NEGATIVE   Bilirubin Urine NEGATIVE NEGATIVE   Ketones, ur NEGATIVE NEGATIVE mg/dL   Protein, ur NEGATIVE NEGATIVE mg/dL   Nitrite NEGATIVE NEGATIVE   Leukocytes, UA NEGATIVE NEGATIVE    Comment: MICROSCOPIC NOT DONE ON URINES WITH NEGATIVE PROTEIN, BLOOD, LEUKOCYTES, NITRITE, OR GLUCOSE <1000 mg/dL.  Respiratory Panel by PCR     Status: None   Collection Time: 01/03/16 11:20 PM  Result Value Ref Range   Adenovirus NOT DETECTED NOT DETECTED   Coronavirus 229E NOT DETECTED NOT DETECTED   Coronavirus HKU1 NOT DETECTED NOT DETECTED   Coronavirus NL63 NOT DETECTED NOT DETECTED   Coronavirus OC43 NOT DETECTED NOT DETECTED   Metapneumovirus NOT DETECTED NOT DETECTED   Rhinovirus / Enterovirus NOT DETECTED NOT DETECTED   Influenza A NOT DETECTED NOT DETECTED   Influenza A H1 NOT DETECTED NOT DETECTED   Influenza A H1 2009 NOT DETECTED NOT DETECTED   Influenza A H3 NOT DETECTED NOT DETECTED   Influenza B NOT DETECTED NOT DETECTED   Parainfluenza Virus 1 NOT DETECTED NOT DETECTED    Parainfluenza Virus 2 NOT DETECTED NOT DETECTED   Parainfluenza Virus 3 NOT DETECTED NOT DETECTED   Parainfluenza Virus 4 NOT DETECTED NOT DETECTED   Respiratory Syncytial Virus NOT DETECTED NOT DETECTED   Bordetella pertussis NOT DETECTED NOT DETECTED   Chlamydophila pneumoniae NOT DETECTED NOT DETECTED   Mycoplasma pneumoniae NOT DETECTED NOT DETECTED    Comment: Performed at [facility]  Glucose, capillary     Status: Abnormal   Collection Time: 01/03/16 11:29 PM  Result Value Ref Range   Glucose-Capillary 228 (H) 65 - 99 mg/dL  CBC     Status: Abnormal   Collection Time: 01/04/16  3:07 AM  Result Value Ref Range   WBC 15.1 (H) 4.0 - 10.5 K/uL   RBC 3.72 (L) 4.22 - 5.81 MIL/uL   Hemoglobin 11.1 (L) 13.0 - 17.0 g/dL   HCT 35.2 (L) 39.0 - 52.0 %   MCV 94.6 78.0 - 100.0 fL   MCH 29.8 26.0 - 34.0 pg   MCHC 31.5 30.0 -  36.0 g/dL   RDW 17.7 (H) 11.5 - 15.5 %   Platelets 328 150 - 400 K/uL  Basic metabolic panel     Status: Abnormal   Collection Time: 01/04/16  3:07 AM  Result Value Ref Range   Sodium 130 (L) 135 - 145 mmol/L   Potassium 5.5 (H) 3.5 - 5.1 mmol/L    Comment: DELTA CHECK NOTED SLIGHT HEMOLYSIS    Chloride 102 101 - 111 mmol/L   CO2 18 (L) 22 - 32 mmol/L   Glucose, Bld 300 (H) 65 - 99 mg/dL   BUN 43 (H) 6 - 20 mg/dL   Creatinine, Ser 2.33 (H) 0.61 - 1.24 mg/dL   Calcium 8.8 (L) 8.9 - 10.3 mg/dL   GFR calc non Af Amer 26 (L) >60 mL/min   GFR calc Af Amer 30 (L) >60 mL/min    Comment: (NOTE) The eGFR has been calculated using the CKD EPI equation. This calculation has not been validated in all clinical situations. eGFR's persistently <60 mL/min signify possible Chronic Kidney Disease.    Anion gap 10 5 - 15  Magnesium     Status: Abnormal   Collection Time: 01/04/16  3:07 AM  Result Value Ref Range   Magnesium 1.6 (L) 1.7 - 2.4 mg/dL  Phosphorus     Status: Abnormal   Collection Time: 01/04/16  3:07 AM  Result Value Ref Range   Phosphorus 7.0 (H)  2.5 - 4.6 mg/dL  Protime-INR     Status: Abnormal   Collection Time: 01/04/16  3:07 AM  Result Value Ref Range   Prothrombin Time 25.4 (H) 11.6 - 15.2 seconds   INR 2.34 (H) 0.00 - 1.49  Blood gas, arterial     Status: Abnormal   Collection Time: 01/04/16  3:28 AM  Result Value Ref Range   FIO2 0.70    Delivery systems VENTILATOR    Mode PRESSURE REGULATED VOLUME CONTROL    VT 410 mL   LHR 26 resp/min   Peep/cpap 10.0 cm H20   pH, Arterial 7.226 (L) 7.350 - 7.450   pCO2 arterial 46.8 (H) 35.0 - 45.0 mmHg   pO2, Arterial 96.6 80.0 - 100.0 mmHg   Bicarbonate 18.7 (L) 20.0 - 24.0 mEq/L   TCO2 17.9 0 - 100 mmol/L   Acid-base deficit 8.2 (H) 0.0 - 2.0 mmol/L   O2 Saturation 94.7 %   Patient temperature 37.0    Collection site LEFT RADIAL    Drawn by [id number]    Sample type ARTERIAL DRAW    Allens test (pass/fail) PASS PASS  Glucose, capillary     Status: Abnormal   Collection Time: 01/04/16  4:04 AM  Result Value Ref Range   Glucose-Capillary 358 (H) 65 - 99 mg/dL  Glucose, capillary     Status: Abnormal   Collection Time: 01/04/16  4:08 AM  Result Value Ref Range   Glucose-Capillary 339 (H) 65 - 99 mg/dL  Glucose, capillary     Status: Abnormal   Collection Time: 01/04/16  7:01 AM  Result Value Ref Range   Glucose-Capillary 271 (H) 65 - 99 mg/dL  Blood gas, arterial     Status: Abnormal   Collection Time: 01/04/16  7:25 AM  Result Value Ref Range   FIO2 0.60    Delivery systems VENTILATOR    Mode PRESSURE REGULATED VOLUME CONTROL    VT 410 mL   LHR 33 resp/min   Peep/cpap 10.0 cm H20   pH, Arterial 7.117 (LL) 7.350 -  7.450    Comment: CRITICAL RESULT CALLED TO, READ BACK BY AND VERIFIED WITH: C.CREECH, RN AT 0732 BY M.JESTER, RRT,RCP ON 01/04/2016    pCO2 arterial 69.8 (HH) 35.0 - 45.0 mmHg    Comment: CRITICAL RESULT CALLED TO, READ BACK BY AND VERIFIED WITH: C.CREECH, RN AT 0732 BY M.JESTER, RRT,RCP ON 01/04/2016    pO2, Arterial 73.9 (L) 80.0 - 100.0 mmHg    Bicarbonate 21.6 20.0 - 24.0 mEq/L   TCO2 20.5 0 - 100 mmol/L   Acid-base deficit 9.3 (H) 0.0 - 2.0 mmol/L   O2 Saturation 86.9 %   Patient temperature 98.6    Collection site RIGHT RADIAL    Drawn by [phone]    Sample type ARTERIAL DRAW    Allens test (pass/fail) PASS PASS  Blood gas, arterial     Status: Abnormal   Collection Time: 01/04/16 12:20 PM  Result Value Ref Range   FIO2 0.60    Delivery systems VENTILATOR    Mode PRESSURE REGULATED VOLUME CONTROL    VT 450 mL   LHR 33 resp/min   Peep/cpap 10.0 cm H20   pH, Arterial 7.120 (LL) 7.350 - 7.450    Comment: CRITICAL RESULT CALLED TO, READ BACK BY AND VERIFIED WITH: D.MCQUAID, MD AT 1228 BY M.JESTER, RRT, RCP ON 01/04/16    pCO2 arterial 64.3 (HH) 35.0 - 45.0 mmHg    Comment: CRITICAL RESULT CALLED TO, READ BACK BY AND VERIFIED WITH: D.MCQUAID, MD AT 1228 BY M.JESTER, RRT, RCP ON 01/04/16    pO2, Arterial 91.3 80.0 - 100.0 mmHg   Bicarbonate 20.1 20.0 - 24.0 mEq/L   TCO2 19.6 0 - 100 mmol/L   Acid-base deficit 9.8 (H) 0.0 - 2.0 mmol/L   O2 Saturation 93.8 %   Patient temperature 98.3    Collection site LEFT RADIAL    Drawn by [id number]    Sample type ARTERIAL DRAW    Allens test (pass/fail) PASS PASS      Component Value Date/Time   SDES BLOOD RIGHT ANTECUBITAL 01/03/2016 2045   SPECREQUEST BOTTLES DRAWN AEROBIC AND ANAEROBIC 5CC 01/03/2016 2045   CULT PENDING 01/03/2016 2045   REPTSTATUS PENDING 01/03/2016 2045   Dg Abd 1 View  Result Date: 01/03/2016 CLINICAL DATA:  OG tube placement EXAM: ABDOMEN - 1 VIEW COMPARISON:  Chest radiograph-earlier same day FINDINGS: Enteric tube tip and side port project over the expected location of the gastric fundus. Large colonic stool burden without definite evidence of enteric obstruction. Post lower thoracic/lumbar paraspinal fusion. Extensive bilateral heterogeneous airspace opacities, similar to chest radiograph performed earlier same date IMPRESSION: 1. Enteric tip and side port  projected the expected location of the gastric fundus. 2. Large colonic stool burden. Electronically Signed   By: Sandi Mariscal M.D.   On: 01/03/2016 22:30  Dg Chest Port 1 View  Result Date: 01/04/2016 CLINICAL DATA:  Status post right hemodialysis catheter placement. EXAM: PORTABLE CHEST 1 VIEW COMPARISON:  Radiograph of same day. FINDINGS: The heart size and mediastinal contours are within normal limits. Endotracheal and nasogastric tubes are unchanged in position. Left subclavian catheter is unchanged in position. There is interval placement of right internal jugular catheter with distal tip overlying expected position of the SVC. No pneumothorax or significant pleural effusion is noted. Stable diffuse interstitial and alveolar opacities are noted throughout both lungs concerning for edema or pneumonia. Status post surgical posterior fusion of the thoracolumbar junction. IMPRESSION: Stable support apparatus. Interval placement of right internal jugular catheter with distal  tip overlying expected position of the SVC. Stable diffuse bilateral lung densities as described above. Electronically Signed   By: Marijo Conception, M.D.   On: 01/04/2016 11:48  Dg Chest Port 1 View  Result Date: 01/04/2016 CLINICAL DATA:  Post central line placement. EXAM: PORTABLE CHEST 1 VIEW COMPARISON:  01/03/2016 FINDINGS: Left subclavian central line has been placed with the tip in the SVC. No pneumothorax. Endotracheal tube is unchanged. Interval placement of NG tube into the stomach. Diffuse interstitial and alveolar opacities throughout the lungs, right greater than left. Slight improvement since prior study. Stable cardiomegaly. No visible effusions. IMPRESSION: Left central line tip in the SVC.  No pneumothorax. Continued diffuse interstitial and alveolar opacities bilaterally, right greater than left, slightly improved. Electronically Signed   By: Rolm Baptise M.D.   On: 01/04/2016 09:37  Dg Chest Port 1 View  Result  Date: 01/03/2016 CLINICAL DATA:  Status post intubation EXAM: PORTABLE CHEST 1 VIEW COMPARISON:  Film from earlier in the same day FINDINGS: Cardiac shadow is again enlarged. Diffuse chronic changes are again seen bilaterally. An endotracheal tube is now noted 3.6 cm above the carina. No new focal abnormality is seen. IMPRESSION: Endotracheal tube in satisfactory position. The remainder of the exam is stable. Electronically Signed   By: Inez Catalina M.D.   On: 01/03/2016 19:38  Dg Chest Portable 1 View  Result Date: 01/03/2016 CLINICAL DATA:  Hypoxia and shortness of Breath EXAM: PORTABLE CHEST 1 VIEW COMPARISON:  12/29/2015 FINDINGS: Cardiac shadow is enlarged. Diffuse interstitial changes and alveolar changes are again noted. Given some variations in the technical factors of film the overall appearance is stable. No new focal infiltrate is seen. Postsurgical changes are again noted in the lower thoracic spine. IMPRESSION: Likely chronic interstitial and alveolar changes similar to that seen on prior exam. Electronically Signed   By: Inez Catalina M.D.   On: 01/03/2016 18:29  Recent Results (from the past 240 hour(s))  MRSA PCR Screening     Status: None   Collection Time: 01/03/16  5:54 PM  Result Value Ref Range Status   MRSA by PCR NEGATIVE NEGATIVE Final    Comment:        The GeneXpert MRSA Assay (FDA approved for NASAL specimens only), is one component of a comprehensive MRSA colonization surveillance program. It is not intended to diagnose MRSA infection nor to guide or monitor treatment for MRSA infections.   Blood Culture (routine x 2)     Status: None (Preliminary result)   Collection Time: 01/03/16  6:44 PM  Result Value Ref Range Status   Specimen Description   Final    BLOOD LEFT ANTECUBITAL Performed at [facility]    Special Requests BOTTLES DRAWN AEROBIC AND ANAEROBIC 5CC  Final   Culture PENDING  Incomplete   Report Status PENDING  Incomplete  Culture,  respiratory (NON-Expectorated)     Status: None (Preliminary result)   Collection Time: 01/03/16  7:52 PM  Result Value Ref Range Status   Specimen Description THROAT  Final   Special Requests NONE  Final   Gram Stain   Final    ABUNDANT WBC PRESENT,BOTH PMN AND MONONUCLEAR RARE GRAM POSITIVE COCCI IN PAIRS RARE GRAM VARIABLE ROD RARE YEAST    Culture   Final    TOO YOUNG TO READ Performed at [facility]    Report Status PENDING  Incomplete  Culture, blood (routine x 2)     Status: None (Preliminary result)  Collection Time: 01/03/16  8:45 PM  Result Value Ref Range Status   Specimen Description BLOOD RIGHT ANTECUBITAL  Final   Special Requests BOTTLES DRAWN AEROBIC AND ANAEROBIC 5CC  Final   Culture PENDING  Incomplete   Report Status PENDING  Incomplete  Respiratory Panel by PCR     Status: None   Collection Time: 01/03/16 11:20 PM  Result Value Ref Range Status   Adenovirus NOT DETECTED NOT DETECTED Final   Coronavirus 229E NOT DETECTED NOT DETECTED Final   Coronavirus HKU1 NOT DETECTED NOT DETECTED Final   Coronavirus NL63 NOT DETECTED NOT DETECTED Final   Coronavirus OC43 NOT DETECTED NOT DETECTED Final   Metapneumovirus NOT DETECTED NOT DETECTED Final   Rhinovirus / Enterovirus NOT DETECTED NOT DETECTED Final   Influenza A NOT DETECTED NOT DETECTED Final   Influenza A H1 NOT DETECTED NOT DETECTED Final   Influenza A H1 2009 NOT DETECTED NOT DETECTED Final   Influenza A H3 NOT DETECTED NOT DETECTED Final   Influenza B NOT DETECTED NOT DETECTED Final   Parainfluenza Virus 1 NOT DETECTED NOT DETECTED Final   Parainfluenza Virus 2 NOT DETECTED NOT DETECTED Final   Parainfluenza Virus 3 NOT DETECTED NOT DETECTED Final   Parainfluenza Virus 4 NOT DETECTED NOT DETECTED Final   Respiratory Syncytial Virus NOT DETECTED NOT DETECTED Final   Bordetella pertussis NOT DETECTED NOT DETECTED Final   Chlamydophila pneumoniae NOT DETECTED NOT DETECTED Final   Mycoplasma  pneumoniae NOT DETECTED NOT DETECTED Final    Comment: Performed at [facility]      01/04/2016, 3:13 PM     LOS: 1 day    Records and images were personally reviewed where available.

## 2016-01-04 NOTE — Procedures (Signed)
Central Venous Catheter Insertion Procedure Note LUISENRIQUE SHAM [id number] July 15, 1940  Procedure: Insertion of Central Venous Catheter Indications: Assessment of intravascular volume  Procedure Details Consent: Risks of procedure as well as the alternatives and risks of each were explained to the (patient/caregiver).  Consent for procedure obtained. Time Out: Verified patient identification, verified procedure, site/side was marked, verified correct patient position, special equipment/implants available, medications/allergies/relevent history reviewed, required imaging and test results available.  Performed  Maximum sterile technique was used including antiseptics, cap, gloves, gown, hand hygiene, mask and sheet. Skin prep: Chlorhexidine; local anesthetic administered A antimicrobial bonded/coated triple lumen catheter was placed in the left subclavian vein using the Seldinger technique.  Ultrasound was used to verify the patency of the vein and for real time needle guidance.  Evaluation Blood flow good Complications: No apparent complications Patient did tolerate procedure well. Chest X-ray ordered to verify placement.  CXR: pending.  Simonne Maffucci 01/04/2016, 9:03 AM

## 2016-01-04 NOTE — Progress Notes (Signed)
[place] Progress Note Patient Name: Curtis Frazier DOB: 1940-08-07 MRN: [id number]   Date of Service  01/04/2016  HPI/Events of Note  ABG on 70%/PRVC 26/TV 410/P 10 = 7.22/46/96.6/18.7  eICU Interventions  Will order: 1. Increase PRVC rate to 33. 2. ABG at 7 AM.     Intervention Category Major Interventions: Acid-Base disturbance - evaluation and management;Respiratory failure - evaluation and management  Kelsee Preslar Cornelia Copa 01/04/2016, 3:46 AM

## 2016-01-04 NOTE — Progress Notes (Addendum)
Pharmacy Antibiotic Note  Curtis Frazier is a 75 y.o. male with PMHx CAD s/p stent, PAF on warfarin s/p recent ablation, CKD-III, DM, diastolic CHF, and RA on chronic prednisone with recent hospitalization for PNA and discharged 7/22,  presents 7/24 from SNF for AMS.  Pharmacy has been consulted for Vancomycin and Primaxin dosing.  Bronch performed showing diffuse, thick white creamy pus throughout.  CT shows interlobular septal thickening with bilateral ground-glass attenuation with features compatible with pulmonary edema although superimposed infectious / inflammatory etiology not excluded.   7/25 SCr elevated and increased to 2.3, CrCl ~26 ml/min CG (N 27) Addendum:  CRRT has been initiated (estimate CrCl ~ 25 -50 ml/min)  Plan:  Decrease to Vancomycin 750 mg IV q24h  Decrease to Primaxin 500mg  IV q12h  F/u renal function, VT at Css, cultures, clinical course    Height: 5\' 8"  (172.7 cm) Weight: 156 lb 12 oz (71.1 kg) IBW/kg (Calculated) : 68.4  Temp (24hrs), Avg:98.5 F (36.9 C), Min:97.6 F (36.4 C), Max:99.9 F (37.7 C)   Recent Labs Lab 12/28/15 0738 12/29/15 0420 01/03/16 1844 01/03/16 1905 01/04/16 0307  WBC 13.3* 12.7* 11.6*  --  15.1*  CREATININE  --  1.60* 1.97*  --  2.33*  LATICACIDVEN  --   --   --  1.19  --     Estimated Creatinine Clearance: 26.1 mL/min (by C-G formula based on SCr of 2.33 mg/dL).    Allergies  Allergen Reactions  . Ace Inhibitors Other (See Comments)    Causes high K   . Angiotensin Receptor Blockers Other (See Comments)    Causes high K   . Metoprolol Other (See Comments)    May cause elevated K level   . Penicillins Swelling and Rash    Has patient had a PCN reaction causing immediate rash, facial/tongue/throat swelling, SOB or lightheadedness with hypotension: YES Has patient had a PCN reaction causing severe rash involving mucus membranes or skin necrosis: NO Has patient had a PCN reaction that required hospitalization NO Has  patient had a PCN reaction occurring within the last 10 years: NO If all of the above answers are "NO", then may proceed with Cephalosporin use. Pt has tolerated cephalosporins in the past   . Amiodarone Other (See Comments)    12/27/15: Cardiologist discontinued the amiodarone (on PTA) due to  concern for toxicity  (in hospitial with acute hypoxemic respiratory failure)   . Tetracycline Swelling and Rash    Antimicrobials this admission: 7/24 Vancomycin >>  7/24 Primaxin >>   Dose adjustments this admission:   Microbiology results: 7/24 BCx:  7/24 BAL:  7/24 UCx: 7/24 BCx: 7/24 MRSA PCR: negative 7/24 Resp panel:   Thank you for allowing pharmacy to be a part of this patient's care.  Gretta Arab PharmD, BCPS Pager [phone] 01/04/2016 7:26 AM

## 2016-01-04 NOTE — Progress Notes (Signed)
PHARMACY - PHYSICIAN COMMUNICATION CRITICAL VALUE ALERT - BLOOD CULTURE IDENTIFICATION (BCID)  Results for orders placed or performed during the hospital encounter of 01/03/16  Blood Culture ID Panel (Reflexed) (Collected: 01/03/2016  6:50 PM)  Result Value Ref Range   Enterococcus species NOT DETECTED NOT DETECTED   Vancomycin resistance NOT DETECTED NOT DETECTED   Listeria monocytogenes NOT DETECTED NOT DETECTED   Staphylococcus species DETECTED (A) NOT DETECTED   Staphylococcus aureus NOT DETECTED NOT DETECTED   Methicillin resistance NOT DETECTED NOT DETECTED   Streptococcus species NOT DETECTED NOT DETECTED   Streptococcus agalactiae NOT DETECTED NOT DETECTED   Streptococcus pneumoniae NOT DETECTED NOT DETECTED   Streptococcus pyogenes NOT DETECTED NOT DETECTED   Acinetobacter baumannii NOT DETECTED NOT DETECTED   Enterobacteriaceae species NOT DETECTED NOT DETECTED   Enterobacter cloacae complex NOT DETECTED NOT DETECTED   Escherichia coli NOT DETECTED NOT DETECTED   Klebsiella oxytoca NOT DETECTED NOT DETECTED   Klebsiella pneumoniae NOT DETECTED NOT DETECTED   Proteus species NOT DETECTED NOT DETECTED   Serratia marcescens NOT DETECTED NOT DETECTED   Carbapenem resistance NOT DETECTED NOT DETECTED   Haemophilus influenzae NOT DETECTED NOT DETECTED   Neisseria meningitidis NOT DETECTED NOT DETECTED   Pseudomonas aeruginosa NOT DETECTED NOT DETECTED   Candida albicans NOT DETECTED NOT DETECTED   Candida glabrata NOT DETECTED NOT DETECTED   Candida krusei NOT DETECTED NOT DETECTED   Candida parapsilosis NOT DETECTED NOT DETECTED   Candida tropicalis NOT DETECTED NOT DETECTED    Name of physician (or Provider) ContactedArbie Cookey at [employer]  Changes to prescribed antibiotics required: none  Angela Adam 01/04/2016  6:44 PM

## 2016-01-04 NOTE — Progress Notes (Signed)
Inpatient Diabetes Program Recommendations  AACE/ADA: New Consensus Statement on Inpatient Glycemic Control (2015)  Target Ranges:  Prepandial:   less than 140 mg/dL      Peak postprandial:   less than 180 mg/dL (1-2 hours)      Critically ill patients:  140 - 180 mg/dL   Results for Curtis Frazier, Curtis Frazier (MRN [id number]) as of 01/04/2016 07:59  Ref. Range 01/03/2016 23:29 01/04/2016 04:04 01/04/2016 04:08 01/04/2016 07:01  Glucose-Capillary Latest Ref Range: 65 - 99 mg/dL 228 (H) 358 (H) 339 (H) 271 (H)    Admit with: Respiratory Failure  History: DM, COPD, CHF, CKD  SNF DM Meds: Levemir 15 units daily    Novolog SSI  Current Insulin Orders: Novolog 2-4-6 Q4 hours per ICU Glycemic Control Protocol     -Note patient currently Intubated.  Receiving continuous Tube Feedings at 40cc/hour.  -Patient also receiving IV Solumedrol 40 mg bid.  -Glucose levels 200-300 mg/dl range.     MD/RN- Please transition patient to Phase 2 (IV Insulin) of the Glycemic Control Protocol     --Will follow patient during hospitalization--  Wyn Quaker RN, MSN, CDE Diabetes Coordinator Inpatient Glycemic Control Team Team Pager: [phone] (8a-5p)

## 2016-01-04 NOTE — Progress Notes (Addendum)
[place] Progress Note Patient Name: Curtis Frazier DOB: December 05, 1940 MRN: [id number]   Date of Service  01/04/2016  HPI/Events of Note  Intermittent increased RR to 40's. Now at Fentanyl IV infusion ceiling of 400 mcg/hour.  Now looks comfortable at 29.  eICU Interventions  Will increase rescue Fentanyl bolus via infusion to 25-50 mcg Q 1 hour PRN.     Intervention Category Major Interventions: Respiratory failure - evaluation and management  Jeremi Losito Eugene 01/04/2016, 3:30 AM

## 2016-01-04 NOTE — Progress Notes (Signed)
Initial Nutrition Assessment  DOCUMENTATION CODES:   Not applicable  INTERVENTION:  - PEPuP protocol.  - RD will follow-up 7/26.  - Will order TF: Nepro @ goal rate of 40 mL/hr with 30 mL Prostat once/day and 50 mL free water every 6 hours.  This regimen will provide 1828 kcal, 93 grams of protein, and 898 mL free water. Please initiate Nepro at goal rate.    NUTRITION DIAGNOSIS:   Inadequate oral intake related to inability to eat as evidenced by NPO status.  GOAL:   Patient will meet greater than or equal to 90% of their needs  MONITOR:   Vent status, TF tolerance, Weight trends, Labs, Skin, I & O's  REASON FOR ASSESSMENT:   Ventilator, Malnutrition Screening Tool, Consult Enteral/tube feeding initiation and management  ASSESSMENT:   75 year old male with PMH as below which is significant for Diastolic CHF, CKD, COPD, PAF (s/p recent ablation), and rheumatoid arthritis (recently on remicade, now on chronic pred 5mg ). He has several recent hospitalizations. Most recently he was admitted 7/15 for presumed HCAP. He was streated with 9 days of HCAP antibiotics and was transferred to [facility] 7/22. 7/24 he was noted to have AMS to the point where he was essentially comatose. O2 sats noted to be about 60% on room air at that time. He was transported to [facility] ED for further evaluation where he was placed on BiPAP with some improvement. CXR was performed which is consistent with interstitial lung changes noted on very recent CT.  Pt seen for reasons outlined above. BMI indicates normal weight. Spoke with pt's daughter who was at bedside this AM. Pt was recently d/c'ed from the hospital (7/22) and daughter states pt was at a nursing home following d/c until admission yesterday. She states that during previous hospitalization and prior to that pt had a good appetite and was eating well and that he ate well on 7/23 but did not have an appetite 7/24 prior to coming to the hospital. Pt does not  have any chewing or swallowing difficulties. Pt has not complained of any abdominal pain or nausea with PO intakes PTA.   Daughter states that pt's UBW is "in the 160s" and that he last weighed this prior to last admission. She states when she saw d/c papers from that admission it was noted that pt had lost a few pounds. Per chart review, pt has lost 9 lbs (5.5% body weight) in the past 1 month which is significant for time frame. Unable to complete physical assessment at this time; will complete tomorrow and document any findings of malnutrition at that time.  Patient is currently intubated on ventilator support with OGT in place.  MV: 13.2 L/min Temp (24hrs), Avg:98.7 F (37.1 C), Min:97.6 F (36.4 C), Max:100 F (37.8 C) Propofol: none  Will order TF as outlined above. Pt with hx of stage 3 CKD and central venous catheter was placed this AM. Per Dr. Anastasia Pall note this AM: Renal consult, will likely need CVVHD given acidosis. Will monitor for this and adjusted estimated nutrition needs as warranted. Will monitor for need to order multivitamin on 7/26 if TF goal rate to remain <45 mL/hr.   Medications reviewed; 12 units Novolog x1 dose today, 40 mg IV Solu-Medrol BID. Labs reviewed; CBGs: 271-358 mg/dL, Na: 130 mmol/L, Ca: 8.8 mg/dL, K: 5.5 mmol/L, BUN: 43 mg/dL, creatinine: 2.33 mg/dL,  Phos: 7 mg/dL, Mg: 1.6 mg/dL, GFR: 26 mL/min.   IVF: NS @ 75 mL/hr. Drips: Nimbex @  3 mcg/kg/min, Versed @ 3 mg/hr.    Diet Order:  Diet NPO time specified  Skin:  Reviewed, no issues  Last BM:  PTA  Height:   Ht Readings from Last 1 Encounters:  01/04/16 5\' 8"  (1.727 m)    Weight:   Wt Readings from Last 1 Encounters:  01/04/16 156 lb 12 oz (71.1 kg)    Ideal Body Weight:  70 kg (kg)  BMI:  Body mass index is 23.83 kg/m.  Estimated Nutritional Needs:   Kcal:  1874  Protein:  85-107 grams (1.2-1.5 grams/kg)  Fluid:  1.8 L/day  EDUCATION NEEDS:   No education needs identified  at this time    Jarome Matin, MS, RD, LDN Inpatient Clinical Dietitian Pager # [phone] After hours/weekend pager # [phone]

## 2016-01-05 ENCOUNTER — Inpatient Hospital Stay (HOSPITAL_COMMUNITY): Payer: Medicare Other

## 2016-01-05 ENCOUNTER — Ambulatory Visit: Payer: Medicare Other | Admitting: Cardiology

## 2016-01-05 DIAGNOSIS — I509 Heart failure, unspecified: Secondary | ICD-10-CM

## 2016-01-05 LAB — CULTURE, BLOOD (ROUTINE X 2)

## 2016-01-05 LAB — RENAL FUNCTION PANEL
ANION GAP: 5 (ref 5–15)
Albumin: 1.9 g/dL — ABNORMAL LOW (ref 3.5–5.0)
Albumin: 2 g/dL — ABNORMAL LOW (ref 3.5–5.0)
Anion gap: 4 — ABNORMAL LOW (ref 5–15)
BUN: 28 mg/dL — ABNORMAL HIGH (ref 6–20)
BUN: 22 mg/dL — AB (ref 6–20)
CALCIUM: 8 mg/dL — AB (ref 8.9–10.3)
CALCIUM: 8 mg/dL — AB (ref 8.9–10.3)
CO2: 24 mmol/L (ref 22–32)
CO2: 26 mmol/L (ref 22–32)
CREATININE: 1.6 mg/dL — AB (ref 0.61–1.24)
CREATININE: 1.25 mg/dL — AB (ref 0.61–1.24)
Chloride: 108 mmol/L (ref 101–111)
Chloride: 105 mmol/L (ref 101–111)
GFR calc Af Amer: >60 mL/min (ref >60)
GFR calc non Af Amer: 54 mL/min — ABNORMAL LOW (ref >60)
GFR, EST AFRICAN AMERICAN: 47 mL/min — AB (ref >60)
GFR, EST NON AFRICAN AMERICAN: 40 mL/min — AB (ref >60)
GLUCOSE: 135 mg/dL — AB (ref 65–99)
Glucose, Bld: 125 mg/dL — ABNORMAL HIGH (ref 65–99)
PHOSPHORUS: 3.5 mg/dL (ref 2.5–4.6)
Phosphorus: 2.7 mg/dL (ref 2.5–4.6)
Potassium: 4.5 mmol/L (ref 3.5–5.1)
Potassium: 4.5 mmol/L (ref 3.5–5.1)
SODIUM: 136 mmol/L (ref 135–145)
SODIUM: 136 mmol/L (ref 135–145)

## 2016-01-05 LAB — CBC
HCT: 33.9 % — ABNORMAL LOW (ref 39.0–52.0)
HEMOGLOBIN: 10.6 g/dL — AB (ref 13.0–17.0)
MCH: 29.6 pg (ref 26.0–34.0)
MCHC: 31.3 g/dL (ref 30.0–36.0)
MCV: 94.7 fL (ref 78.0–100.0)
PLATELETS: 164 10*3/uL (ref 150–400)
RBC: 3.58 MIL/uL — AB (ref 4.22–5.81)
RDW: 17.8 % — ABNORMAL HIGH (ref 11.5–15.5)
WBC: 18.1 10*3/uL — AB (ref 4.0–10.5)

## 2016-01-05 LAB — ANCA TITERS

## 2016-01-05 LAB — BLOOD GAS, ARTERIAL
Acid-base deficit: 2.3 mmol/L — ABNORMAL HIGH (ref 0.0–2.0)
BICARBONATE: 24 meq/L (ref 20.0–24.0)
Drawn by: 276051
FIO2: 0.4
LHR: 33 {breaths}/min
O2 SAT: 88.2 %
PATIENT TEMPERATURE: 36.4
PCO2 ART: 49.9 mmHg — AB (ref 35.0–45.0)
PEEP/CPAP: 8 cmH2O
PH ART: 7.3 — AB (ref 7.350–7.450)
TCO2: 22.7 mmol/L (ref 0–100)
VT: 480 mL
pO2, Arterial: 57.9 mmHg — ABNORMAL LOW (ref 80.0–100.0)

## 2016-01-05 LAB — GLUCOSE, CAPILLARY
GLUCOSE-CAPILLARY: 116 mg/dL — AB (ref 65–99)
GLUCOSE-CAPILLARY: 123 mg/dL — AB (ref 65–99)
GLUCOSE-CAPILLARY: 128 mg/dL — AB (ref 65–99)
GLUCOSE-CAPILLARY: 130 mg/dL — AB (ref 65–99)
GLUCOSE-CAPILLARY: 142 mg/dL — AB (ref 65–99)
GLUCOSE-CAPILLARY: 157 mg/dL — AB (ref 65–99)
GLUCOSE-CAPILLARY: 160 mg/dL — AB (ref 65–99)
GLUCOSE-CAPILLARY: 204 mg/dL — AB (ref 65–99)
GLUCOSE-CAPILLARY: 207 mg/dL — AB (ref 65–99)
GLUCOSE-CAPILLARY: 211 mg/dL — AB (ref 65–99)
Glucose-Capillary: 241 mg/dL — ABNORMAL HIGH (ref 65–99)
Glucose-Capillary: 235 mg/dL — ABNORMAL HIGH (ref 65–99)
Glucose-Capillary: 148 mg/dL — ABNORMAL HIGH (ref 65–99)
Glucose-Capillary: 141 mg/dL — ABNORMAL HIGH (ref 65–99)
Glucose-Capillary: 127 mg/dL — ABNORMAL HIGH (ref 65–99)
Glucose-Capillary: 132 mg/dL — ABNORMAL HIGH (ref 65–99)
Glucose-Capillary: 139 mg/dL — ABNORMAL HIGH (ref 65–99)
Glucose-Capillary: 157 mg/dL — ABNORMAL HIGH (ref 65–99)
Glucose-Capillary: 186 mg/dL — ABNORMAL HIGH (ref 65–99)
Glucose-Capillary: 216 mg/dL — ABNORMAL HIGH (ref 65–99)
Glucose-Capillary: 232 mg/dL — ABNORMAL HIGH (ref 65–99)
Glucose-Capillary: 201 mg/dL — ABNORMAL HIGH (ref 65–99)
Glucose-Capillary: 175 mg/dL — ABNORMAL HIGH (ref 65–99)

## 2016-01-05 LAB — PROTIME-INR
INR: 2.8
PROTHROMBIN TIME: 28.2 s — AB (ref 11.4–15.2)

## 2016-01-05 LAB — ECHOCARDIOGRAM LIMITED
Height: 68 in
WEIGHTICAEL: 2606.72 [oz_av]

## 2016-01-05 LAB — URINE CULTURE: Culture: NO GROWTH

## 2016-01-05 LAB — MAGNESIUM: MAGNESIUM: 1.9 mg/dL (ref 1.7–2.4)

## 2016-01-05 LAB — C4 COMPLEMENT: Complement C4, Body Fluid: 13 mg/dL — ABNORMAL LOW (ref 14–44)

## 2016-01-05 LAB — APTT: APTT: 43 s — AB (ref 24–36)

## 2016-01-05 LAB — C3 COMPLEMENT: C3 Complement: 120 mg/dL (ref 82–167)

## 2016-01-05 MED ORDER — PRO-STAT SUGAR FREE PO LIQD
30.0000 mL | Freq: Three times a day (TID) | ORAL | Status: DC
  Administered 2016-01-05 – 2016-01-06 (×3): 30 mL
  Filled 2016-01-05 (×3): qty 30

## 2016-01-05 MED ORDER — SODIUM CHLORIDE 0.9 % IV SOLN
1.0000 g | Freq: Two times a day (BID) | INTRAVENOUS | Status: DC
  Administered 2016-01-05 – 2016-01-13 (×17): 1 g via INTRAVENOUS
  Filled 2016-01-05 (×18): qty 1

## 2016-01-05 MED ORDER — MIDAZOLAM BOLUS VIA INFUSION
1.0000 mg | INTRAVENOUS | Status: DC | PRN
  Filled 2016-01-05: qty 2

## 2016-01-05 MED ORDER — SODIUM CHLORIDE 0.9 % IV SOLN
25.0000 ug/h | INTRAVENOUS | Status: DC
  Administered 2016-01-05: 200 ug/h via INTRAVENOUS
  Administered 2016-01-06: 100 ug/h via INTRAVENOUS
  Administered 2016-01-07: 50 ug/h via INTRAVENOUS
  Administered 2016-01-07 – 2016-01-08 (×2): 100 ug/h via INTRAVENOUS
  Administered 2016-01-09: 200 ug/h via INTRAVENOUS
  Administered 2016-01-09: 175 ug/h via INTRAVENOUS
  Administered 2016-01-10 (×2): 200 ug/h via INTRAVENOUS
  Administered 2016-01-10: 275 ug/h via INTRAVENOUS
  Administered 2016-01-11 – 2016-01-12 (×2): 100 ug/h via INTRAVENOUS
  Administered 2016-01-13: 75 ug/h via INTRAVENOUS
  Filled 2016-01-05 (×11): qty 50

## 2016-01-05 MED ORDER — SODIUM CHLORIDE 0.9 % IV SOLN
500.0000 mg | INTRAVENOUS | Status: AC
  Administered 2016-01-05: 500 mg via INTRAVENOUS
  Filled 2016-01-05: qty 0.5

## 2016-01-05 MED ORDER — FENTANYL CITRATE (PF) 100 MCG/2ML IJ SOLN
50.0000 ug | Freq: Once | INTRAMUSCULAR | Status: AC
  Administered 2016-01-05: 50 ug via INTRAVENOUS

## 2016-01-05 MED ORDER — SODIUM CHLORIDE 0.9 % IV SOLN
0.0000 mg/h | INTRAVENOUS | Status: DC
  Administered 2016-01-06: 1 mg/h via INTRAVENOUS
  Filled 2016-01-05 (×2): qty 10

## 2016-01-05 MED ORDER — VITAL 1.5 CAL PO LIQD
1000.0000 mL | ORAL | Status: DC
  Administered 2016-01-05: 1000 mL
  Filled 2016-01-05 (×2): qty 1000

## 2016-01-05 MED ORDER — FENTANYL BOLUS VIA INFUSION
50.0000 ug | INTRAVENOUS | Status: DC | PRN
  Administered 2016-01-07 – 2016-01-10 (×7): 50 ug via INTRAVENOUS
  Filled 2016-01-05: qty 50

## 2016-01-05 NOTE — Progress Notes (Signed)
Nutrition Follow-up  DOCUMENTATION CODES:   Non-severe (moderate) malnutrition in context of acute illness/injury  INTERVENTION:  - Continue PEPuP protocol. - Free water flush per MD/NP given pt on CRRT.  - RD will follow-up 7/27.  - Will change TF order: Vital 1.5 @ 45 mL/hr with 30 mL Prostat TID.  This regimen will provide 1920 kcal, 118 grams of protein, and 825 mL free water. Vital 1.5 can be started at goal rate of 45 mL/hr.   NUTRITION DIAGNOSIS:   Inadequate oral intake related to inability to eat as evidenced by NPO status. -ongoing  GOAL:   Patient will meet greater than or equal to 90% of their needs -met with current TF regimen.  MONITOR:   Vent status, TF tolerance, Weight trends, Labs, Skin, I & O's  ASSESSMENT:   75 year old male with PMH as below which is significant for Diastolic CHF, CKD, COPD, PAF (s/p recent ablation), and rheumatoid arthritis (recently on remicade, now on chronic pred 59m). He has several recent hospitalizations. Most recently he was admitted 7/15 for presumed HCAP. He was streated with 9 days of HCAP antibiotics [facility] 7/24 he was noted to have AMS to the point where he was essentially comatose. O2 sats noted to be about 60% on room air at that time. [facility] with some improvement. CXR was performed which is consistent with interstitial lung changes noted on very recent CT.  7/26 Pt remains with OGT in place and currently receiving TF at goal rate: Nepro @ 40 mL/hr with 30 mL Prostat once/day and 50 mL free water every 6 hours; this is providing 1828 kcal, 93 grams of protein, and 898 mL of free water.   Son at bedside this AM. HD catheter was placed yesterday ~1130 and CRRT started yesterday. Estimated nutrition needs adjusted based on vent, CRRT. Admission weight of 71.1 kg used as weight is up since that time.   Patient is currently intubated  on ventilator support MV: 15.4 L/min Temp (24hrs), Avg:96.7 F (35.9 C), Min:93.6 F (34.2 C), Max:99.3 F (37.4 C) Propofol: none  Will change TF order as outlined above and follow-up tomorrow. Free water, if desired, to be ordered by MD/NP given CRRT. Pt meets criteria for malnutrition based on mild/moderate muscle wasting to upper body and 5.5% body weight loss in the past 1 month.   Medications reviewed; 40 mg IV Solu-medrol BID.  Labs reviewed; CBGs: 123-157 mg/dL, BUN: 28 mg/dL, creatinine: 1.6 mg/dL, Ca: 8 mg/dL, GFR: 40 mL/min.  IVF: NS @ 75 mL/hr.  Drips: Fentanyl @ 200 mcg/hr, Versed @ 2 mg/hr, Neo @ 45 mcg/min, insulin @ 2.6 units/hr.   7/25 - Spoke with pt's daughter who was at bedside this AM.  - Pt was recently d/c'ed from the hospital (7/22) and daughter states pt was at a nursing home following d/c until admission yesterday.  - She states pt had a good appetite and was eating well and that he ate well on 7/23 but did not have an appetite 7/24.  - Pt does not have any chewing or swallowing difficulties. - Pt has not complained of any abdominal pain or nausea with PO intakes PTA.  - Daughter states that pt's UBW is "in the 160s" and that he last weighed this prior to last admission.  - She states when she saw d/c papers from that admission it was noted that  pt had lost a few pounds.  - Per chart review, pt has lost 9 lbs (5.5% body weight) in the past 1 month which is significant for time frame.  - Unable to complete physical assessment at this time; will complete tomorrow and document any findings of malnutrition at that time. - Patient is currently intubated on ventilator support with OGT in place. ; MV: 13.2 L/min; Temp  Max:100 F (37.8 C); Propofol: none - Will order TF as outlined above. Pt with hx of stage 3 CKD and central venous catheter was placed this AM.  - Per Dr. Anastasia Pall note this AM: Renal consult, will likely need CVVHD given acidosis. Will monitor  for this and adjusted estimated nutrition needs as warranted.  - K: 5.5 mmol/L, BUN: 43 mg/dL, creatinine: 2.33 mg/dL,  Phos: 7 mg/dL, Mg: 1.6 mg/dL, GFR: 26 mL/min.  IVF: NS @ 75 mL/hr. Drips: Nimbex @ 3 mcg/kg/min, Versed @ 3 mg/hr.    Diet Order:  Diet NPO time specified  Skin:  Reviewed, no issues  Last BM:  PTA  Height:   Ht Readings from Last 1 Encounters:  01/05/16 5' 8"  (1.727 m)    Weight:   Wt Readings from Last 1 Encounters:  01/05/16 162 lb 14.7 oz (73.9 kg)    Ideal Body Weight:  70 kg (kg)  BMI:  Body mass index is 24.77 kg/m.  Estimated Nutritional Needs:   Kcal:  1875  Protein:  107-121 grams (1.5-1.7 grams/kg)  Fluid:  1.6-1.8 L/day  EDUCATION NEEDS:   No education needs identified at this time    Jarome Matin, MS, RD, LDN Inpatient Clinical Dietitian Pager # [phone] After hours/weekend pager # [phone]

## 2016-01-05 NOTE — Progress Notes (Signed)
INFECTIOUS DISEASE PROGRESS NOTE  ID: BARAN KUHRT is a 75 y.o. male with  Active Problems:   Acute respiratory failure with hypoxemia ([place])   Visit for intubation   ARDS (adult respiratory distress syndrome) (HCC)  Subjective: On vent, CVVHD occas hand squeeze.  Tremors from coming off nimbex.   Abtx:  Anti-infectives    Start     Dose/Rate Route Frequency Ordered Stop   01/05/16 2200  meropenem (MERREM) 1 g in sodium chloride 0.9 % 100 mL IVPB     1 g 200 mL/hr over 30 Minutes Intravenous Every 12 hours 01/05/16 1023     01/05/16 1030  meropenem (MERREM) 500 mg in sodium chloride 0.9 % 50 mL IVPB     500 mg 100 mL/hr over 30 Minutes Intravenous STAT 01/05/16 1023 01/05/16 1125   01/05/16 0100  meropenem (MERREM) 500 mg in sodium chloride 0.9 % 50 mL IVPB  Status:  Discontinued     500 mg 100 mL/hr over 30 Minutes Intravenous Every 8 hours 01/04/16 1620 01/05/16 1023   01/04/16 2200  linezolid (ZYVOX) IVPB 600 mg     600 mg 300 mL/hr over 60 Minutes Intravenous Every 12 hours 01/04/16 1555     01/04/16 2000  vancomycin (VANCOCIN) IVPB 1000 mg/200 mL premix  Status:  Discontinued     1,000 mg 200 mL/hr over 60 Minutes Intravenous Every 24 hours 01/03/16 2016 01/04/16 0730   01/04/16 2000  vancomycin (VANCOCIN) IVPB 750 mg/150 ml premix  Status:  Discontinued     750 mg 150 mL/hr over 60 Minutes Intravenous Every 24 hours 01/04/16 0730 01/04/16 1555   01/04/16 1800  imipenem-cilastatin (PRIMAXIN) 500 mg in sodium chloride 0.9 % 100 mL IVPB  Status:  Discontinued     500 mg 200 mL/hr over 30 Minutes Intravenous Every 12 hours 01/04/16 0730 01/04/16 1555   01/04/16 1700  levofloxacin (LEVAQUIN) IVPB 500 mg     500 mg 100 mL/hr over 60 Minutes Intravenous Every 24 hours 01/04/16 1555     01/04/16 1700  meropenem (MERREM) 1 g in sodium chloride 0.9 % 100 mL IVPB     1 g 200 mL/hr over 30 Minutes Intravenous  Once 01/04/16 1619 01/04/16 1712   01/04/16 0400   imipenem-cilastatin (PRIMAXIN) 500 mg in sodium chloride 0.9 % 100 mL IVPB  Status:  Discontinued     500 mg 200 mL/hr over 30 Minutes Intravenous Every 8 hours 01/03/16 2016 01/04/16 0730   01/03/16 2000  imipenem-cilastatin (PRIMAXIN) 500 mg in sodium chloride 0.9 % 100 mL IVPB     500 mg 200 mL/hr over 30 Minutes Intravenous STAT 01/03/16 1953 01/03/16 2114   01/03/16 1830  vancomycin (VANCOCIN) IVPB 1000 mg/200 mL premix     1,000 mg 200 mL/hr over 60 Minutes Intravenous  Once 01/03/16 1819 01/03/16 2043      Medications:  Scheduled: . antiseptic oral rinse  7 mL Mouth Rinse QID  . chlorhexidine gluconate (SAGE KIT)  15 mL Mouth Rinse BID  . famotidine (PEPCID) IV  20 mg Intravenous Q24H  . feeding supplement (PRO-STAT SUGAR FREE 64)  30 mL Per Tube TID  . ipratropium-albuterol  3 mL Nebulization Q6H  . levofloxacin (LEVAQUIN) IV  500 mg Intravenous Q24H  . linezolid (ZYVOX) IV  600 mg Intravenous Q12H  . meropenem (MERREM) IV  1 g Intravenous Q12H  . methylPREDNISolone (SOLU-MEDROL) injection  40 mg Intravenous Q12H   Phenylephrine drip  Objective: Vital signs  in last 24 hours: Temp:  [93.6 F (34.2 C)-99.3 F (37.4 C)] 97 F (36.1 C) (07/26 1600) Pulse Rate:  [56-66] 66 (07/26 0812) Resp:  [23-33] 33 (07/26 1600) BP: (68-145)/(36-66) 97/48 (07/26 1600) SpO2:  [88 %-97 %] 93 % (07/26 1600) FiO2 (%):  [40 %-50 %] 50 % (07/26 1523) Weight:  [73.9 kg (162 lb 14.7 oz)] 73.9 kg (162 lb 14.7 oz) (07/26 0500)   General appearance: no distress Neck: R sided HD line Resp: rhonchi bilaterally Cardio: regular rate and rhythm GI: abnormal findings:  absent bowel sounds and distended Extremities: edema none  Lab Results  Recent Labs  01/04/16 0307 01/04/16 1410 01/05/16 0400  WBC 15.1*  --  18.1*  HGB 11.1*  --  10.6*  HCT 35.2*  --  33.9*  NA 130* 135  134* 136  K 5.5* 4.8  4.7 4.5  CL 102 107  106 108  CO2 18* 22  21* 24  BUN 43* 43*  44* 28*  CREATININE  2.33* 2.69*  2.70* 1.60*   Liver Panel  Recent Labs  01/03/16 1844 01/04/16 1410 01/05/16 0400  PROT 7.1  --   --   ALBUMIN 2.1* 1.9* 1.9*  AST 56*  --   --   ALT 40  --   --   ALKPHOS 127*  --   --   BILITOT 0.8  --   --    Sedimentation Rate  Recent Labs  01/03/16 2045  ESRSEDRATE 80*   C-Reactive Protein  Recent Labs  01/03/16 2045  CRP 19.7*    Microbiology: Recent Results (from the past 240 hour(s))  MRSA PCR Screening     Status: None   Collection Time: 01/03/16  5:54 PM  Result Value Ref Range Status   MRSA by PCR NEGATIVE NEGATIVE Final    Comment:        The GeneXpert MRSA Assay (FDA approved for NASAL specimens only), is one component of a comprehensive MRSA colonization surveillance program. It is not intended to diagnose MRSA infection nor to guide or monitor treatment for MRSA infections.   Blood Culture (routine x 2)     Status: None (Preliminary result)   Collection Time: 01/03/16  6:44 PM  Result Value Ref Range Status   Specimen Description BLOOD LEFT ANTECUBITAL  Final   Special Requests BOTTLES DRAWN AEROBIC AND ANAEROBIC 5CC  Final   Culture   Final    NO GROWTH 2 DAYS Performed at [facility]    Report Status PENDING  Incomplete  Blood Culture (routine x 2)     Status: Abnormal   Collection Time: 01/03/16  6:50 PM  Result Value Ref Range Status   Specimen Description BLOOD LEFT HAND  Final   Special Requests BOTTLES DRAWN AEROBIC AND ANAEROBIC 5CC  Final   Culture  Setup Time   Final    GRAM POSITIVE COCCI IN CLUSTERS AEROBIC BOTTLE ONLY CRITICAL RESULT CALLED TO, READ BACK BY AND VERIFIED WITH: E. JACKSON, [facility] PHARMD AT [place] ON 01/04/16 BY C. JESSUP, MLT.    Culture (A)  Final    STAPHYLOCOCCUS SPECIES (COAGULASE NEGATIVE) THE SIGNIFICANCE OF ISOLATING THIS ORGANISM FROM A SINGLE SET OF BLOOD CULTURES WHEN MULTIPLE SETS ARE DRAWN IS UNCERTAIN. PLEASE NOTIFY THE MICROBIOLOGY DEPARTMENT WITHIN ONE WEEK IF SPECIATION AND  SENSITIVITIES ARE REQUIRED. Performed at [facility]    Report Status 01/05/2016 FINAL  Final  Blood Culture ID Panel (Reflexed)     Status:  Abnormal   Collection Time: 01/03/16  6:50 PM  Result Value Ref Range Status   Enterococcus species NOT DETECTED NOT DETECTED Final   Vancomycin resistance NOT DETECTED NOT DETECTED Final   Listeria monocytogenes NOT DETECTED NOT DETECTED Final   Staphylococcus species DETECTED (A) NOT DETECTED Final    Comment: CRITICAL RESULT CALLED TO, READ BACK BY AND VERIFIED WITH: [place], [facility] PHARMD AT 1840 ON 01/04/16 BY C. JESSUP, MLT.    Staphylococcus aureus NOT DETECTED NOT DETECTED Final   Methicillin resistance NOT DETECTED NOT DETECTED Final   Streptococcus species NOT DETECTED NOT DETECTED Final   Streptococcus agalactiae NOT DETECTED NOT DETECTED Final   Streptococcus pneumoniae NOT DETECTED NOT DETECTED Final   Streptococcus pyogenes NOT DETECTED NOT DETECTED Final   Acinetobacter baumannii NOT DETECTED NOT DETECTED Final   Enterobacteriaceae species NOT DETECTED NOT DETECTED Final   Enterobacter cloacae complex NOT DETECTED NOT DETECTED Final   Escherichia coli NOT DETECTED NOT DETECTED Final   Klebsiella oxytoca NOT DETECTED NOT DETECTED Final   Klebsiella pneumoniae NOT DETECTED NOT DETECTED Final   Proteus species NOT DETECTED NOT DETECTED Final   Serratia marcescens NOT DETECTED NOT DETECTED Final   Carbapenem resistance NOT DETECTED NOT DETECTED Final   Haemophilus influenzae NOT DETECTED NOT DETECTED Final   Neisseria meningitidis NOT DETECTED NOT DETECTED Final   Pseudomonas aeruginosa NOT DETECTED NOT DETECTED Final   Candida albicans NOT DETECTED NOT DETECTED Final   Candida glabrata NOT DETECTED NOT DETECTED Final   Candida krusei NOT DETECTED NOT DETECTED Final   Candida parapsilosis NOT DETECTED NOT DETECTED Final   Candida tropicalis NOT DETECTED NOT DETECTED Final    Comment: Performed at [facility]    Culture, respiratory (NON-Expectorated)     Status: None (Preliminary result)   Collection Time: 01/03/16  7:52 PM  Result Value Ref Range Status   Specimen Description THROAT  Final   Special Requests NONE  Final   Gram Stain   Final    ABUNDANT WBC PRESENT,BOTH PMN AND MONONUCLEAR RARE GRAM POSITIVE COCCI IN PAIRS RARE GRAM VARIABLE ROD RARE YEAST    Culture   Final    CULTURE REINCUBATED FOR BETTER GROWTH Performed at [facility]    Report Status PENDING  Incomplete  Culture, blood (routine x 2)     Status: None (Preliminary result)   Collection Time: 01/03/16  8:45 PM  Result Value Ref Range Status   Specimen Description BLOOD RIGHT ANTECUBITAL  Final   Special Requests BOTTLES DRAWN AEROBIC AND ANAEROBIC 5CC  Final   Culture   Final    NO GROWTH 2 DAYS Performed at [facility]    Report Status PENDING  Incomplete  Culture, blood (routine x 2)     Status: None (Preliminary result)   Collection Time: 01/03/16  8:49 PM  Result Value Ref Range Status   Specimen Description BLOOD RIGHT HAND  Final   Special Requests BOTTLES DRAWN AEROBIC ONLY 5CC  Final   Culture   Final    NO GROWTH 2 DAYS Performed at [facility]    Report Status PENDING  Incomplete  Urine culture     Status: None   Collection Time: 01/03/16 11:15 PM  Result Value Ref Range Status   Specimen Description URINE, CATHETERIZED  Final   Special Requests NONE  Final   Culture NO GROWTH Performed at [facility]   Final   Report Status 01/05/2016 FINAL  Final  Respiratory Panel by PCR     Status: None   Collection Time: 01/03/16 11:20 PM  Result Value Ref Range Status   Adenovirus NOT DETECTED NOT DETECTED Final   Coronavirus 229E NOT DETECTED NOT DETECTED Final   Coronavirus HKU1 NOT DETECTED NOT DETECTED Final   Coronavirus NL63 NOT DETECTED NOT DETECTED Final   Coronavirus OC43 NOT DETECTED NOT DETECTED Final   Metapneumovirus NOT DETECTED NOT DETECTED Final    Rhinovirus / Enterovirus NOT DETECTED NOT DETECTED Final   Influenza A NOT DETECTED NOT DETECTED Final   Influenza A H1 NOT DETECTED NOT DETECTED Final   Influenza A H1 2009 NOT DETECTED NOT DETECTED Final   Influenza A H3 NOT DETECTED NOT DETECTED Final   Influenza B NOT DETECTED NOT DETECTED Final   Parainfluenza Virus 1 NOT DETECTED NOT DETECTED Final   Parainfluenza Virus 2 NOT DETECTED NOT DETECTED Final   Parainfluenza Virus 3 NOT DETECTED NOT DETECTED Final   Parainfluenza Virus 4 NOT DETECTED NOT DETECTED Final   Respiratory Syncytial Virus NOT DETECTED NOT DETECTED Final   Bordetella pertussis NOT DETECTED NOT DETECTED Final   Chlamydophila pneumoniae NOT DETECTED NOT DETECTED Final   Mycoplasma pneumoniae NOT DETECTED NOT DETECTED Final    Comment: Performed at [facility]    Studies/Results: Dg Abd 1 View  Result Date: 01/03/2016 CLINICAL DATA:  OG tube placement EXAM: ABDOMEN - 1 VIEW COMPARISON:  Chest radiograph-earlier same day FINDINGS: Enteric tube tip and side port project over the expected location of the gastric fundus. Large colonic stool burden without definite evidence of enteric obstruction. Post lower thoracic/lumbar paraspinal fusion. Extensive bilateral heterogeneous airspace opacities, similar to chest radiograph performed earlier same date IMPRESSION: 1. Enteric tip and side port projected the expected location of the gastric fundus. 2. Large colonic stool burden. Electronically Signed   By: Sandi Mariscal M.D.   On: 01/03/2016 22:30  Dg Chest Port 1 View  Result Date: 01/05/2016 CLINICAL DATA:  ARDS. EXAM: PORTABLE CHEST 1 VIEW COMPARISON:  01/04/2016 FINDINGS: Nasogastric tube, endotracheal tube, left subclavian central venous catheter and right IJ venous catheter unchanged. Lungs are adequately inflated demonstrate mild bilateral patchy hazy mixed interstitial airspace density without significant change. No definite effusion. Mild stable cardiomegaly.  Curvature of the spine convex right with partially visualized stabilization hardware intact. IMPRESSION: Stable hazy patch mixed interstitial airspace density bilaterally. Tubes and lines unchanged. Electronically Signed   By: Marin Olp M.D.   On: 01/05/2016 09:57  Dg Chest Port 1 View  Result Date: 01/04/2016 CLINICAL DATA:  Status post right hemodialysis catheter placement. EXAM: PORTABLE CHEST 1 VIEW COMPARISON:  Radiograph of same day. FINDINGS: The heart size and mediastinal contours are within normal limits. Endotracheal and nasogastric tubes are unchanged in position. Left subclavian catheter is unchanged in position. There is interval placement of right internal jugular catheter with distal tip overlying expected position of the SVC. No pneumothorax or significant pleural effusion is noted. Stable diffuse interstitial and alveolar opacities are noted throughout both lungs concerning for edema or pneumonia. Status post surgical posterior fusion of the thoracolumbar junction. IMPRESSION: Stable support apparatus. Interval placement of right internal jugular catheter with distal tip overlying expected position of the SVC. Stable diffuse bilateral lung densities as described above. Electronically Signed   By: Marijo Conception, M.D.   On: 01/04/2016 11:48  Dg Chest Port 1 View  Result Date: 01/04/2016 CLINICAL DATA:  Post central line placement. EXAM: PORTABLE CHEST 1 VIEW  COMPARISON:  01/03/2016 FINDINGS: Left subclavian central line has been placed with the tip in the SVC. No pneumothorax. Endotracheal tube is unchanged. Interval placement of NG tube into the stomach. Diffuse interstitial and alveolar opacities throughout the lungs, right greater than left. Slight improvement since prior study. Stable cardiomegaly. No visible effusions. IMPRESSION: Left central line tip in the SVC.  No pneumothorax. Continued diffuse interstitial and alveolar opacities bilaterally, right greater than left, slightly  improved. Electronically Signed   By: Rolm Baptise M.D.   On: 01/04/2016 09:37  Dg Chest Port 1 View  Result Date: 01/03/2016 CLINICAL DATA:  Status post intubation EXAM: PORTABLE CHEST 1 VIEW COMPARISON:  Film from earlier in the same day FINDINGS: Cardiac shadow is again enlarged. Diffuse chronic changes are again seen bilaterally. An endotracheal tube is now noted 3.6 cm above the carina. No new focal abnormality is seen. IMPRESSION: Endotracheal tube in satisfactory position. The remainder of the exam is stable. Electronically Signed   By: Inez Catalina M.D.   On: 01/03/2016 19:38  Dg Chest Portable 1 View  Result Date: 01/03/2016 CLINICAL DATA:  Hypoxia and shortness of Breath EXAM: PORTABLE CHEST 1 VIEW COMPARISON:  12/29/2015 FINDINGS: Cardiac shadow is enlarged. Diffuse interstitial changes and alveolar changes are again noted. Given some variations in the technical factors of film the overall appearance is stable. No new focal infiltrate is seen. Postsurgical changes are again noted in the lower thoracic spine. IMPRESSION: Likely chronic interstitial and alveolar changes similar to that seen on prior exam. Electronically Signed   By: Inez Catalina M.D.   On: 01/03/2016 18:29    Assessment/Plan: HCAP Bronch Cx pending.  BCx 1/2 CNS (contaminant), UCx negative. Resp virus panel (-) CXR unchanged Check legionella Ag TTE EF 55-60% On steroids.   CKD Cr and pH better today (CVVHD) Prev Cr 1.6 Appreciate renal f/u.   Antibiotic days: 1 zyvox/merrem/levaquin  Family updated.          Bobby Rumpf Infectious Diseases (pager) [phone] [email or web address] 01/05/2016, 4:17 PM  LOS: 2 days

## 2016-01-05 NOTE — Progress Notes (Signed)
Pharmacy Antibiotic Note  [facility] is a 75 y.o. male with PMHx CAD s/p stent, PAF on warfarin s/p recent ablation, CKD-III, DM, diastolic CHF, and RA on chronic prednisone with recent hospitalization for PNA and discharged 7/22,  presents 7/24 from SNF for AMS.  Bronch performed showing diffuse, thick white creamy pus throughout.  CT shows interlobular septal thickening with bilateral ground-glass attenuation with features compatible with pulmonary edema although superimposed infectious / inflammatory etiology not excluded. Pharmacy was intitially consulted for Vanc and Primaxin dosing, but ID consulted on 7/25 and Vanc & Primaxin have been D/C'ed.  Zyvox and Levofloxacin to begin and Pharmacy consulted to dose Meropenem.    7/26 SCr elevated but improved to 1.6 with calculated CrCl ~38 ml/min CG CRRT was initiated on 7/25  (estimate CrCl ~ 25 -50 ml/min)  Plan:  Zyvox 600mg  IV q12h per MD  Levofloxacin 500mg  IV q24h per MD  Meropenem 1gm IV q12h  F/u renal function, cultures, clinical course  Height: 5\' 8"  (172.7 cm) Weight: 162 lb 14.7 oz (73.9 kg) IBW/kg (Calculated) : 68.4  Temp (24hrs), Avg:96.6 F (35.9 C), Min:93.6 F (34.2 C), Max:99.3 F (37.4 C)   Recent Labs Lab 01/03/16 1844 01/03/16 1905 01/04/16 0307 01/04/16 1410 01/05/16 0400  WBC 11.6*  --  15.1*  --  18.1*  CREATININE 1.97*  --  2.33* 2.69*  2.70* 1.60*  LATICACIDVEN  --  1.19  --   --   --     Estimated Creatinine Clearance: 38 mL/min (by C-G formula based on SCr of 1.6 mg/dL).    Allergies  Allergen Reactions  . Ace Inhibitors Other (See Comments)    Causes high K   . Angiotensin Receptor Blockers Other (See Comments)    Causes high K   . Metoprolol Other (See Comments)    May cause elevated K level   . Penicillins Swelling and Rash    Has patient had a PCN reaction causing immediate rash, facial/tongue/throat swelling, SOB or lightheadedness with hypotension: YES Has patient had a PCN  reaction causing severe rash involving mucus membranes or skin necrosis: NO Has patient had a PCN reaction that required hospitalization NO Has patient had a PCN reaction occurring within the last 10 years: NO If all of the above answers are "NO", then may proceed with Cephalosporin use. Pt has tolerated cephalosporins in the past   . Amiodarone Other (See Comments)    12/27/15: Cardiologist discontinued the amiodarone (on PTA) due to  concern for toxicity  (in hospitial with acute hypoxemic respiratory failure)   . Tetracycline Swelling and Rash    Antimicrobials this admission: 7/24 Vancomycin >> 7/25 7/24 Primaxin >> 7/25 7/25 Meropenem >> 7/25 Zyvox >>  7/25 Levofloxacin >>  Dose adjustments this admission:   Microbiology results: 7/24 BCx: 1/2 Staph species, 1/2 NG 7/24 BCID: Staphylococcus species DETECTED (presumed organism is Oxacillin-susceptible coagulase negative staphylococcus.  Possibly contaminant?) 7/24 BAL: rare GPC pairs, rare gram variable, rare yeast 7/24 UCx: sent 7/24 BCx: ngtd 7/24 MRSA PCR: negative 7/24 Resp panel: none detected  Thank you for allowing pharmacy to be a part of this patient's care.  Gretta Arab PharmD, BCPS Pager [phone] 01/05/2016 8:24 AM

## 2016-01-05 NOTE — Clinical Social Work Note (Addendum)
Clinical Social Work Assessment  Patient Details  Name: [facility] MRN: [id number] Date of Birth: 1941/02/12  Date of referral:  01/05/16               Reason for consult:  Facility Placement, Discharge Planning                Permission sought to share information with:  Facility Sport and exercise psychologist, Family Supports Permission granted to share information::     Name::        Agency::     Relationship::     Contact Information:     Housing/Transportation Living arrangements for the past 2 months:  [place], [place] of Information:  Medical Team, Adult Children Patient Interpreter Needed:  None Criminal Activity/Legal Involvement Pertinent to Current Situation/Hospitalization:  No - Comment as needed Significant Relationships:  Adult Children Lives with:  Spouse Do you feel safe going back to the place where you live?  Yes Need for family participation in patient care:  Yes (Comment)  Care giving concerns:  Patient unable to assess at time due critical care. Patient daughter at bedside and and agreeable to the assessment. She reports [facility] was at [facility]. Patient became ill and reported he did not feel well. Family thought pt. Had a low grade fever not eating or sleeping. Patient was then brought to ER after having trouble breathing. Daughter reports pt. breathing was compromised.    Social Worker assessment / plan: LCSWA met with patient daughter and explained reason for consult. Patient daughter expresses she will consult with her five other siblings and mother about patient disposition once he is medically stable. Patient daughter would like to patient to return to [facility] for rehab if it is an option. She reports he completed rehab there earlier this year in February-March.   Assist with patient disposition once determined.   Employment status:  Retired Forensic scientist:  Medicare PT Recommendations:  Not  assessed at this time Information / Referral to community resources:     Patient/Family's Response to care:  Patient family is agreeable to disposition once medically stable.   Patient/Family's Understanding of and Emotional Response to Diagnosis, Current Treatment, and Prognosis:  Patient daughter reports the family is very involved in patient care.  "I know that his kidneys are failing but the MD reports that are getting better. If I am not here my mother, sisters, and brothers are here."   Emotional Assessment Appearance:  Appears stated age Attitude/Demeanor/Rapport:  Unable to Assess Affect (typically observed):  Unable to Assess Orientation:    Alcohol / Substance use:  Not Applicable Psych involvement (Current and /or in the community):  No (Comment)  Discharge Needs  Concerns to be addressed:  Care Coordination Readmission within the last 30 days:  Yes Current discharge risk:  Dependent with Mobility Barriers to Discharge:  No Barriers Identified   Curtis Hopping, LCSW 01/05/2016, 4:42 PM

## 2016-01-05 NOTE — Progress Notes (Signed)
PULMONARY / CRITICAL CARE MEDICINE   Name: Curtis Frazier MRN: [id number] DOB: [birth year]    ADMISSION DATE:  01/03/2016 CONSULTATION DATE:  01/03/2016  REFERRING MD:  Dr. Dayna Barker EDP  CHIEF COMPLAINT:  SOB/AMS  BRIEF: 75 y/o male with a rheumatoid arthritis and a recent admission for acute respiratory failure with hypoxemia presumably due to HCAP was re-admitted to [facility] on 7/24 with recurrent acute on chronic hypoxemic respiratory failure with a picture consistent with ARDS.   SUBJECTIVE: Started on CVVHD yesterday; on pressors overnight, acidosis improved, oxygenation stable   VITAL SIGNS: BP (!) 97/48   Pulse 66   Temp (!) 96.8 F (36 C) (Core (Comment)) Comment (Src): temp foley  Resp (!) 33   Ht 5\' 8"  (1.727 m)   Wt 162 lb 14.7 oz (73.9 kg)   SpO2 93%   BMI 24.77 kg/m   HEMODYNAMICS: CVP:  [9 mmHg-21 mmHg] 11 mmHg  VENTILATOR SETTINGS: Vent Mode: PRVC FiO2 (%):  [40 %-70 %] 40 % Set Rate:  [33 bmp] 33 bmp Vt Set:  [450 mL-480 mL] 480 mL PEEP:  [8 cmH20-10 cmH20] 8 cmH20 Plateau Pressure:  [17 cmH20-20 cmH20] 18 cmH20  INTAKE / OUTPUT: I/O last 3 completed shifts: In: 6723.9 [I.V.:4910.6; NG/GT:1163.3; IV Piggyback:650] Out: 4811 [Urine:871; Other:3940]  PHYSICAL EXAMINATION: General:  Paralyzed on vent HENT: NCAT ETT in place PULM: vent supported breaths CV: RRR, no mgr GI: BS+, soft, nontender MSK: normal bulk and tone Neuro: heavily sedated, paralyzed  LABS:  BMET  Recent Labs Lab 01/04/16 0307 01/04/16 1410 01/05/16 0400  NA 130* 135  134* 136  K 5.5* 4.8  4.7 4.5  CL 102 107  106 108  CO2 18* 22  21* 24  BUN 43* 43*  44* 28*  CREATININE 2.33* 2.69*  2.70* 1.60*  GLUCOSE 300* 220*  225* 125*    Electrolytes  Recent Labs Lab 01/04/16 0307 01/04/16 1410 01/04/16 1601 01/05/16 0400  CALCIUM 8.8* 8.2*  8.1*  --  8.0*  MG 1.6*  --  1.8 1.9  PHOS 7.0* 6.0* 5.9* 3.5    CBC  Recent Labs Lab 01/03/16 1844 01/04/16 0307  01/05/16 0400  WBC 11.6* 15.1* 18.1*  HGB 11.4* 11.1* 10.6*  HCT 35.8* 35.2* 33.9*  PLT 301 328 164    Coag's  Recent Labs Lab 01/03/16 2126 01/04/16 0307 01/05/16 0400 01/05/16 0414  APTT  --   --  43*  --   INR 2.56* 2.34*  --  2.80    Sepsis Markers  Recent Labs Lab 12/30/15 0547 01/03/16 1905  LATICACIDVEN  --  1.19  PROCALCITON 0.38  --     ABG  Recent Labs Lab 01/04/16 0725 01/04/16 1220 01/05/16 0858  PHART 7.117* 7.120* 7.300*  PCO2ART 69.8* 64.3* 49.9*  PO2ART 73.9* 91.3 57.9*    Liver Enzymes  Recent Labs Lab 01/03/16 1844 01/04/16 1410 01/05/16 0400  AST 56*  --   --   ALT 40  --   --   ALKPHOS 127*  --   --   BILITOT 0.8  --   --   ALBUMIN 2.1* 1.9* 1.9*    Cardiac Enzymes  Recent Labs Lab 01/03/16 1844  TROPONINI 0.13*    Glucose  Recent Labs Lab 01/05/16 0211 01/05/16 0317 01/05/16 0405 01/05/16 0509 01/05/16 0559 01/05/16 0650  GLUCAP 130* 128* 123* 127* 132* 139*    Imaging Dg Chest Port 1 View  Result Date: 01/04/2016 CLINICAL DATA:  Status post  right hemodialysis catheter placement. EXAM: PORTABLE CHEST 1 VIEW COMPARISON:  Radiograph of same day. FINDINGS: The heart size and mediastinal contours are within normal limits. Endotracheal and nasogastric tubes are unchanged in position. Left subclavian catheter is unchanged in position. There is interval placement of right internal jugular catheter with distal tip overlying expected position of the SVC. No pneumothorax or significant pleural effusion is noted. Stable diffuse interstitial and alveolar opacities are noted throughout both lungs concerning for edema or pneumonia. Status post surgical posterior fusion of the thoracolumbar junction. IMPRESSION: Stable support apparatus. Interval placement of right internal jugular catheter with distal tip overlying expected position of the SVC. Stable diffuse bilateral lung densities as described above. Electronically Signed   By:  Marijo Conception, M.D.   On: 01/04/2016 11:48  Dg Chest Port 1 View  Result Date: 01/04/2016 CLINICAL DATA:  Post central line placement. EXAM: PORTABLE CHEST 1 VIEW COMPARISON:  01/03/2016 FINDINGS: Left subclavian central line has been placed with the tip in the SVC. No pneumothorax. Endotracheal tube is unchanged. Interval placement of NG tube into the stomach. Diffuse interstitial and alveolar opacities throughout the lungs, right greater than left. Slight improvement since prior study. Stable cardiomegaly. No visible effusions. IMPRESSION: Left central line tip in the SVC.  No pneumothorax. Continued diffuse interstitial and alveolar opacities bilaterally, right greater than left, slightly improved. Electronically Signed   By: Rolm Baptise M.D.   On: 01/04/2016 09:37    STUDIES:  CTA chest 7/15> No CT evidence for acute pulmonary embolus. Interlobular septal thickening with bilateral ground-glass attenuation. Imaging features are compatible with pulmonary edema although superimposed infectious/inflammatory etiology is not excluded. Bibasilar collapse/ consolidation. Interval progression of mediastinal and right hilar lymphadenopathy, nonspecific.   CULTURES: BC x 2 7/24 > staph species Urine 7/24 > BAL 7/24 > RVP 7/24 > negative PCP 7/24 >  ANTIBIOTICS: Aztreonam 7/24 >7/25 Meropenem 7/25 >  Levaquin 7/24 > Vancomycin 7/24 > 7/25 Linezolid 7/25 >   SIGNIFICANT EVENTS: 7/22 discharge to SNF  LINES/TUBES: ETT 7/24 >  DISCUSSION: 75 year old male with baseline RA and COPD and a recent admission for acute respiratory failure with hypoxemia was admitted on 7/24 with ARDS.  DDx includes HCAP, opportunistic infection, or less likely an inflammatory process related to his baseline RA.  Has multi-organ failure.  As of 7/26 he is culture negative, awaiting PJP testing from [facility].  ASSESSMENT / PLAN:   PULMONARY A: Acute hypoxemic respiratory failure > see discussion above ARDS >  improved oxygenation overnight COPD/emphysema HCAP  Mediastinal lymphadenopathy  P:   ARDS protocol > maintain FiO2/PEEP per protocol, goal PaO2 55-65, goal pH > 7.15 VAP prevention Stop nimbex  F/u serology panel   CARDIOVASCULAR A:  Hypotension > sedation related, CVP 9-11 99991111 Chronic diastolic CHF Afib s/p ablation P:  Telemetry  MAP goal > 68mmHg Follow CVP  RENAL A:   Acute on Chronic kidney disease with worsening metabolic acidosis P:   Appreciate renal Continue CVVHD  GASTROINTESTINAL A:   No acute issues P:   Continue tube feeding Pepcid for SUP  HEMATOLOGIC A:   Warfarin induced coagulopathy P:  Follow CBC Repeat INR in AM  May need heparin, follow INR  INFECTIOUS A:   HCAP DDx lung process: Opportunistic? Autoimmune process Staph bacteremia> presumably contaminant P:   Antibiotics Cultures as above Follow PJP analysis Consider stronger immunosuppression 7/27 if cultures remain negative  ENDOCRINE A:   Chronic prednisone use  P:  Continue solumedrol at current dosing CBG monitoring and SSI  NEUROLOGIC A:   Acute metabolic encephalopathy due to hypoxemia and medical sedation P:   RASS goal: -2 Fentanyl infusion Versed infusion Stop nimbex as oxygenation/acidosis improving   FAMILY  - Updates: daughter updated 7/25 at length  - Inter-disciplinary family meet or Palliative Care meeting due by: 7/30    CC time 35 minutes    Roselie Awkward, MD [place] [facility] Pager: [phone] Cell: [phone] After 3pm or if no response, call [phone]  01/05/2016 9:15 AM

## 2016-01-05 NOTE — Progress Notes (Signed)
Curtis Frazier Progress Note   Subjective: on vent and warming blanket, neo gtt at 45 ug/min  Vitals:   01/05/16 0715 01/05/16 0730 01/05/16 0800 01/05/16 0812  BP: 112/61 (!) 101/53 (!) 97/48 (!) 97/48  Pulse:   66 66  Resp: (!) 33 (!) 33 (!) 33 (!) 33  Temp: (!) 96.1 F (35.6 C) (!) 96.4 F (35.8 C) (!) 96.8 F (36 C)   TempSrc:  Core (Comment) Core (Comment)   SpO2: 93% 93% 93% 93%  Weight:      Height:        Inpatient medications: . antiseptic oral rinse  7 mL Mouth Rinse QID  . artificial tears   Both Eyes Q8H  . chlorhexidine gluconate (SAGE KIT)  15 mL Mouth Rinse BID  . famotidine (PEPCID) IV  20 mg Intravenous Q24H  . feeding supplement (NEPRO CARB STEADY)  1,000 mL Per Tube Q24H  . feeding supplement (PRO-STAT SUGAR FREE 64)  30 mL Per Tube Daily  . fentaNYL (SUBLIMAZE) injection  50 mcg Intravenous Once  . free water  50 mL Per Tube Q6H  . ipratropium-albuterol  3 mL Nebulization Q6H  . levofloxacin (LEVAQUIN) IV  500 mg Intravenous Q24H  . linezolid (ZYVOX) IV  600 mg Intravenous Q12H  . meropenem (MERREM) IV  500 mg Intravenous Q8H  . methylPREDNISolone (SOLU-MEDROL) injection  40 mg Intravenous Q12H   . sodium chloride 75 mL/hr at 01/05/16 0900  . fentaNYL infusion INTRAVENOUS 200 mcg/hr (01/05/16 0934)  . insulin (NOVOLIN-R) infusion 2.6 mL/hr at 01/05/16 0900  . midazolam (VERSED) infusion 2 mg/hr (01/05/16 0934)  . phenylephrine (NEO-SYNEPHRINE) Adult infusion 45 mcg/min (01/05/16 0925)  . dialysis replacement fluid (prismasate) 400 mL/hr at 01/05/16 0335  . dialysis replacement fluid (prismasate) 200 mL/hr at 01/04/16 1130  . dialysate (PRISMASATE) 2,000 mL/hr at 01/05/16 0715   sodium chloride, sodium chloride, albuterol, alteplase, fentaNYL, heparin, midazolam  Exam: Gen on vent, unresponsive, sedated Sclera anicteric, throat w ETT  No jvd or bruits Chest some post crackles at bases RRR no MRG Abd soft nondistended no ascites or  mass GU normal male w Foley Ext no LE or UE edema Neuro as above  CT chest > underlying emphysema w bilat patchy infiltrates/ ground glass changes, LL > UL's CXR 7.25 > patchy bilat infiltrates UA - negative CVP 10-12  Assessment: 1  Acute on CRF3 - making urine, CRRT for severe acidemia/ resp acidosis and inability to renal compensate 2  Recurrent resp failure - on vent, CO2 retention and hypoxemia, FiO2 70%; unclear pulm process, recurrent PNA vs other (ILI, ARDS, autoimmune) 3  Volume - CVP normal, no gross excess on exam 4  COPD underlying 5  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  Plan - cont CRRT , keep even   Kelly Splinter MD [facility] pager [phone]    cell [phone] 01/05/2016, 9:35 AM    Recent Labs Lab 01/04/16 0307 01/04/16 1410 01/04/16 1601 01/05/16 0400  NA 130* 135  134*  --  136  K 5.5* 4.8  4.7  --  4.5  CL 102 107  106  --  108  CO2 18* 22  21*  --  24  GLUCOSE 300* 220*  225*  --  125*  BUN 43* 43*  44*  --  28*  CREATININE 2.33* 2.69*  2.70*  --  1.60*  CALCIUM 8.8* 8.2*  8.1*  --  8.0*  PHOS 7.0* 6.0*  5.9* 3.5    Recent Labs Lab 01/03/16 1844 01/04/16 1410 01/05/16 0400  AST 56*  --   --   ALT 40  --   --   ALKPHOS 127*  --   --   BILITOT 0.8  --   --   PROT 7.1  --   --   ALBUMIN 2.1* 1.9* 1.9*    Recent Labs Lab 01/03/16 1844 01/04/16 0307 01/05/16 0400  WBC 11.6* 15.1* 18.1*  NEUTROABS 9.2*  --   --   HGB 11.4* 11.1* 10.6*  HCT 35.8* 35.2* 33.9*  MCV 92.5 94.6 94.7  PLT 301 328 164   Iron/TIBC/Ferritin/ %Sat    Component Value Date/Time   IRON 24 (L) 08/13/2015 0430   TIBC 258 08/13/2015 0430   FERRITIN 147 08/13/2015 0430   IRONPCTSAT 9 (L) 08/13/2015 0430

## 2016-01-06 ENCOUNTER — Inpatient Hospital Stay (HOSPITAL_COMMUNITY): Payer: Medicare Other

## 2016-01-06 DIAGNOSIS — E440 Moderate protein-calorie malnutrition: Secondary | ICD-10-CM | POA: Insufficient documentation

## 2016-01-06 LAB — GLUCOSE, CAPILLARY
GLUCOSE-CAPILLARY: 106 mg/dL — AB (ref 65–99)
GLUCOSE-CAPILLARY: 128 mg/dL — AB (ref 65–99)
GLUCOSE-CAPILLARY: 155 mg/dL — AB (ref 65–99)
GLUCOSE-CAPILLARY: 165 mg/dL — AB (ref 65–99)
GLUCOSE-CAPILLARY: 175 mg/dL — AB (ref 65–99)
GLUCOSE-CAPILLARY: 183 mg/dL — AB (ref 65–99)
GLUCOSE-CAPILLARY: 203 mg/dL — AB (ref 65–99)
GLUCOSE-CAPILLARY: 207 mg/dL — AB (ref 65–99)
GLUCOSE-CAPILLARY: 226 mg/dL — AB (ref 65–99)
GLUCOSE-CAPILLARY: 241 mg/dL — AB (ref 65–99)
GLUCOSE-CAPILLARY: 279 mg/dL — AB (ref 65–99)
GLUCOSE-CAPILLARY: 350 mg/dL — AB (ref 65–99)
GLUCOSE-CAPILLARY: 95 mg/dL (ref 65–99)
Glucose-Capillary: 135 mg/dL — ABNORMAL HIGH (ref 65–99)
Glucose-Capillary: 175 mg/dL — ABNORMAL HIGH (ref 65–99)
Glucose-Capillary: 210 mg/dL — ABNORMAL HIGH (ref 65–99)
Glucose-Capillary: 101 mg/dL — ABNORMAL HIGH (ref 65–99)
Glucose-Capillary: 107 mg/dL — ABNORMAL HIGH (ref 65–99)
Glucose-Capillary: 115 mg/dL — ABNORMAL HIGH (ref 65–99)
Glucose-Capillary: 158 mg/dL — ABNORMAL HIGH (ref 65–99)
Glucose-Capillary: 186 mg/dL — ABNORMAL HIGH (ref 65–99)
Glucose-Capillary: 199 mg/dL — ABNORMAL HIGH (ref 65–99)
Glucose-Capillary: 93 mg/dL (ref 65–99)

## 2016-01-06 LAB — BLOOD GAS, ARTERIAL
ACID-BASE DEFICIT: 2.9 mmol/L — AB (ref 0.0–2.0)
Bicarbonate: 25.6 mEq/L — ABNORMAL HIGH (ref 20.0–24.0)
DRAWN BY: 331471
FIO2: 0.5
MECHVT: 480 mL
O2 SAT: 90.4 %
PCO2 ART: 66.4 mmHg — AB (ref 35.0–45.0)
PEEP: 10 cmH2O
PH ART: 7.21 — AB (ref 7.350–7.450)
PO2 ART: 69.9 mmHg — AB (ref 80.0–100.0)
Patient temperature: 98.6
RATE: 25 resp/min
TCO2: 24.4 mmol/L (ref 0–100)

## 2016-01-06 LAB — RENAL FUNCTION PANEL
ALBUMIN: 2.1 g/dL — AB (ref 3.5–5.0)
ALBUMIN: 2.4 g/dL — AB (ref 3.5–5.0)
ANION GAP: 4 — AB (ref 5–15)
ANION GAP: 6 (ref 5–15)
BUN: 26 mg/dL — ABNORMAL HIGH (ref 6–20)
BUN: 20 mg/dL (ref 6–20)
CALCIUM: 8.3 mg/dL — AB (ref 8.9–10.3)
CO2: 27 mmol/L (ref 22–32)
CO2: 27 mmol/L (ref 22–32)
Calcium: 8.3 mg/dL — ABNORMAL LOW (ref 8.9–10.3)
Chloride: 104 mmol/L (ref 101–111)
Chloride: 103 mmol/L (ref 101–111)
Creatinine, Ser: 1.36 mg/dL — ABNORMAL HIGH (ref 0.61–1.24)
Creatinine, Ser: 1.12 mg/dL (ref 0.61–1.24)
GFR calc non Af Amer: 49 mL/min — ABNORMAL LOW (ref >60)
GFR calc non Af Amer: >60 mL/min (ref >60)
GFR, EST AFRICAN AMERICAN: 57 mL/min — AB (ref >60)
GLUCOSE: 135 mg/dL — AB (ref 65–99)
GLUCOSE: 131 mg/dL — AB (ref 65–99)
PHOSPHORUS: 2.8 mg/dL (ref 2.5–4.6)
PHOSPHORUS: 2.2 mg/dL — AB (ref 2.5–4.6)
POTASSIUM: 4.5 mmol/L (ref 3.5–5.1)
Potassium: 4.5 mmol/L (ref 3.5–5.1)
SODIUM: 135 mmol/L (ref 135–145)
Sodium: 136 mmol/L (ref 135–145)

## 2016-01-06 LAB — BASIC METABOLIC PANEL
Anion gap: 5 (ref 5–15)
BUN: 25 mg/dL — AB (ref 6–20)
CHLORIDE: 105 mmol/L (ref 101–111)
CO2: 26 mmol/L (ref 22–32)
CREATININE: 1.34 mg/dL — AB (ref 0.61–1.24)
Calcium: 8.3 mg/dL — ABNORMAL LOW (ref 8.9–10.3)
GFR calc Af Amer: 58 mL/min — ABNORMAL LOW (ref >60)
GFR calc non Af Amer: 50 mL/min — ABNORMAL LOW (ref >60)
GLUCOSE: 138 mg/dL — AB (ref 65–99)
Potassium: 4.5 mmol/L (ref 3.5–5.1)
SODIUM: 136 mmol/L (ref 135–145)

## 2016-01-06 LAB — CBC WITH DIFFERENTIAL/PLATELET
Basophils Absolute: 0 10*3/uL (ref 0.0–0.1)
Basophils Relative: 0 %
EOS ABS: 0 10*3/uL (ref 0.0–0.7)
EOS PCT: 0 %
HCT: 36.4 % — ABNORMAL LOW (ref 39.0–52.0)
Hemoglobin: 11.1 g/dL — ABNORMAL LOW (ref 13.0–17.0)
LYMPHS ABS: 0.5 10*3/uL — AB (ref 0.7–4.0)
Lymphocytes Relative: 2 %
MCH: 29.4 pg (ref 26.0–34.0)
MCHC: 30.5 g/dL (ref 30.0–36.0)
MCV: 96.6 fL (ref 78.0–100.0)
MONO ABS: 1.3 10*3/uL — AB (ref 0.1–1.0)
Monocytes Relative: 6 %
Neutro Abs: 22 10*3/uL — ABNORMAL HIGH (ref 1.7–7.7)
Neutrophils Relative %: 92 %
PLATELETS: 155 10*3/uL (ref 150–400)
RBC: 3.77 MIL/uL — AB (ref 4.22–5.81)
RDW: 18.4 % — AB (ref 11.5–15.5)
WBC: 23.9 10*3/uL — AB (ref 4.0–10.5)

## 2016-01-06 LAB — PROTIME-INR
INR: 2.93
Prothrombin Time: 31.2 seconds — ABNORMAL HIGH (ref 11.4–15.2)

## 2016-01-06 LAB — CULTURE, RESPIRATORY W GRAM STAIN: Culture: NORMAL

## 2016-01-06 LAB — RHEUMATOID FACTOR: RHEUMATOID FACTOR: 12.4 [IU]/mL (ref 0.0–13.9)

## 2016-01-06 LAB — LEGIONELLA PNEUMOPHILA SEROGP 1 UR AG: L. PNEUMOPHILA SEROGP 1 UR AG: NEGATIVE

## 2016-01-06 LAB — MAGNESIUM: Magnesium: 2.3 mg/dL (ref 1.7–2.4)

## 2016-01-06 LAB — APTT: APTT: 51 s — AB (ref 24–36)

## 2016-01-06 MED ORDER — PRO-STAT SUGAR FREE PO LIQD
30.0000 mL | Freq: Every day | ORAL | Status: DC
  Administered 2016-01-07: 30 mL
  Filled 2016-01-06: qty 30

## 2016-01-06 MED ORDER — INSULIN GLARGINE 100 UNIT/ML ~~LOC~~ SOLN
10.0000 [IU] | SUBCUTANEOUS | Status: DC
  Administered 2016-01-06: 10 [IU] via SUBCUTANEOUS
  Filled 2016-01-06: qty 0.1

## 2016-01-06 MED ORDER — SODIUM CHLORIDE 0.9 % IV SOLN
100.0000 mg | INTRAVENOUS | Status: AC
  Administered 2016-01-07 – 2016-01-16 (×10): 100 mg via INTRAVENOUS
  Filled 2016-01-06 (×10): qty 100

## 2016-01-06 MED ORDER — INSULIN ASPART 100 UNIT/ML ~~LOC~~ SOLN
0.0000 [IU] | SUBCUTANEOUS | Status: DC
  Administered 2016-01-06: 2 [IU] via SUBCUTANEOUS
  Administered 2016-01-06: 8 [IU] via SUBCUTANEOUS
  Administered 2016-01-07: 3 [IU] via SUBCUTANEOUS
  Administered 2016-01-07: 5 [IU] via SUBCUTANEOUS
  Administered 2016-01-07: 11 [IU] via SUBCUTANEOUS
  Administered 2016-01-07: 5 [IU] via SUBCUTANEOUS
  Administered 2016-01-07 (×2): 8 [IU] via SUBCUTANEOUS
  Administered 2016-01-08 (×2): 5 [IU] via SUBCUTANEOUS
  Administered 2016-01-08: 3 [IU] via SUBCUTANEOUS
  Administered 2016-01-08: 8 [IU] via SUBCUTANEOUS
  Administered 2016-01-08 (×2): 5 [IU] via SUBCUTANEOUS
  Administered 2016-01-08: 11 [IU] via SUBCUTANEOUS
  Administered 2016-01-09: 5 [IU] via SUBCUTANEOUS
  Administered 2016-01-09: 3 [IU] via SUBCUTANEOUS
  Administered 2016-01-09: 11 [IU] via SUBCUTANEOUS
  Administered 2016-01-09: 5 [IU] via SUBCUTANEOUS
  Administered 2016-01-09: 3 [IU] via SUBCUTANEOUS
  Administered 2016-01-10: 5 [IU] via SUBCUTANEOUS
  Administered 2016-01-10: 8 [IU] via SUBCUTANEOUS
  Administered 2016-01-10: 3 [IU] via SUBCUTANEOUS
  Administered 2016-01-10 (×2): 2 [IU] via SUBCUTANEOUS
  Administered 2016-01-10: 3 [IU] via SUBCUTANEOUS
  Administered 2016-01-11: 11 [IU] via SUBCUTANEOUS
  Administered 2016-01-12: 3 [IU] via SUBCUTANEOUS
  Administered 2016-01-12: 2 [IU] via SUBCUTANEOUS
  Administered 2016-01-12 – 2016-01-13 (×2): 3 [IU] via SUBCUTANEOUS
  Administered 2016-01-13: 5 [IU] via SUBCUTANEOUS
  Administered 2016-01-13: 2 [IU] via SUBCUTANEOUS
  Administered 2016-01-13: 3 [IU] via SUBCUTANEOUS
  Administered 2016-01-14: 2 [IU] via SUBCUTANEOUS
  Administered 2016-01-14: 5 [IU] via SUBCUTANEOUS
  Administered 2016-01-14 (×3): 3 [IU] via SUBCUTANEOUS
  Administered 2016-01-14: 2 [IU] via SUBCUTANEOUS
  Administered 2016-01-15 (×2): 3 [IU] via SUBCUTANEOUS
  Administered 2016-01-15 (×2): 8 [IU] via SUBCUTANEOUS
  Administered 2016-01-15 (×2): 3 [IU] via SUBCUTANEOUS
  Administered 2016-01-16: 5 [IU] via SUBCUTANEOUS
  Administered 2016-01-16: 3 [IU] via SUBCUTANEOUS
  Administered 2016-01-16: 5 [IU] via SUBCUTANEOUS
  Administered 2016-01-16: 8 [IU] via SUBCUTANEOUS
  Administered 2016-01-16: 5 [IU] via SUBCUTANEOUS
  Administered 2016-01-16: 3 [IU] via SUBCUTANEOUS
  Administered 2016-01-17: 5 [IU] via SUBCUTANEOUS
  Administered 2016-01-17 (×3): 3 [IU] via SUBCUTANEOUS
  Administered 2016-01-17: 2 [IU] via SUBCUTANEOUS
  Administered 2016-01-17: 3 [IU] via SUBCUTANEOUS
  Administered 2016-01-18: 5 [IU] via SUBCUTANEOUS
  Administered 2016-01-18 (×2): 3 [IU] via SUBCUTANEOUS
  Administered 2016-01-18 (×2): 2 [IU] via SUBCUTANEOUS
  Administered 2016-01-19 (×3): 3 [IU] via SUBCUTANEOUS

## 2016-01-06 MED ORDER — SODIUM CHLORIDE 0.9 % IV SOLN
200.0000 mg | Freq: Once | INTRAVENOUS | Status: AC
  Administered 2016-01-06: 200 mg via INTRAVENOUS
  Filled 2016-01-06: qty 200

## 2016-01-06 MED ORDER — VITAL AF 1.2 CAL PO LIQD
1000.0000 mL | ORAL | Status: DC
  Administered 2016-01-06: 1000 mL
  Filled 2016-01-06 (×2): qty 1000

## 2016-01-06 MED ORDER — DEXTROSE 10 % IV SOLN: INTRAVENOUS | Status: DC | PRN

## 2016-01-06 MED ORDER — INSULIN ASPART 100 UNIT/ML ~~LOC~~ SOLN
1.0000 [IU] | SUBCUTANEOUS | Status: DC
  Administered 2016-01-06: 1 [IU] via SUBCUTANEOUS

## 2016-01-06 NOTE — Progress Notes (Signed)
Date:  January 06, 2016 Chart reviewed for concurrent status and case management needs. Will continue to follow the patient for changes and needs: full vent support and iv sedation Velva Harman, BSN, [place], [place]

## 2016-01-06 NOTE — Progress Notes (Signed)
RT collected sputum per MD order and gave to RN.

## 2016-01-06 NOTE — Progress Notes (Addendum)
INFECTIOUS DISEASE PROGRESS NOTE  ID: Curtis Frazier is a 75 y.o. male with  Active Problems:   Acute respiratory failure with hypoxemia ([place])   Visit for intubation   ARDS (adult respiratory distress syndrome) ([place])   Malnutrition of moderate degree  Subjective: On vent, cvvhd, sedation, phenylephrine  Abtx:  Anti-infectives    Start     Dose/Rate Route Frequency Ordered Stop   01/05/16 2200  meropenem (MERREM) 1 g in sodium chloride 0.9 % 100 mL IVPB     1 g 200 mL/hr over 30 Minutes Intravenous Every 12 hours 01/05/16 1023     01/05/16 1030  meropenem (MERREM) 500 mg in sodium chloride 0.9 % 50 mL IVPB     500 mg 100 mL/hr over 30 Minutes Intravenous STAT 01/05/16 1023 01/05/16 1125   01/05/16 0100  meropenem (MERREM) 500 mg in sodium chloride 0.9 % 50 mL IVPB  Status:  Discontinued     500 mg 100 mL/hr over 30 Minutes Intravenous Every 8 hours 01/04/16 1620 01/05/16 1023   01/04/16 2200  linezolid (ZYVOX) IVPB 600 mg     600 mg 300 mL/hr over 60 Minutes Intravenous Every 12 hours 01/04/16 1555     01/04/16 2000  vancomycin (VANCOCIN) IVPB 1000 mg/200 mL premix  Status:  Discontinued     1,000 mg 200 mL/hr over 60 Minutes Intravenous Every 24 hours 01/03/16 2016 01/04/16 0730   01/04/16 2000  vancomycin (VANCOCIN) IVPB 750 mg/150 ml premix  Status:  Discontinued     750 mg 150 mL/hr over 60 Minutes Intravenous Every 24 hours 01/04/16 0730 01/04/16 1555   01/04/16 1800  imipenem-cilastatin (PRIMAXIN) 500 mg in sodium chloride 0.9 % 100 mL IVPB  Status:  Discontinued     500 mg 200 mL/hr over 30 Minutes Intravenous Every 12 hours 01/04/16 0730 01/04/16 1555   01/04/16 1700  levofloxacin (LEVAQUIN) IVPB 500 mg     500 mg 100 mL/hr over 60 Minutes Intravenous Every 24 hours 01/04/16 1555     01/04/16 1700  meropenem (MERREM) 1 g in sodium chloride 0.9 % 100 mL IVPB     1 g 200 mL/hr over 30 Minutes Intravenous  Once 01/04/16 1619 01/04/16 1712   01/04/16 0400   imipenem-cilastatin (PRIMAXIN) 500 mg in sodium chloride 0.9 % 100 mL IVPB  Status:  Discontinued     500 mg 200 mL/hr over 30 Minutes Intravenous Every 8 hours 01/03/16 2016 01/04/16 0730   01/03/16 2000  imipenem-cilastatin (PRIMAXIN) 500 mg in sodium chloride 0.9 % 100 mL IVPB     500 mg 200 mL/hr over 30 Minutes Intravenous STAT 01/03/16 1953 01/03/16 2114   01/03/16 1830  vancomycin (VANCOCIN) IVPB 1000 mg/200 mL premix     1,000 mg 200 mL/hr over 60 Minutes Intravenous  Once 01/03/16 1819 01/03/16 2043      Medications:  Scheduled: . antiseptic oral rinse  7 mL Mouth Rinse QID  . chlorhexidine gluconate (SAGE KIT)  15 mL Mouth Rinse BID  . famotidine (PEPCID) IV  20 mg Intravenous Q24H  . [START ON 01/07/2016] feeding supplement (PRO-STAT SUGAR FREE 64)  30 mL Per Tube Daily  . insulin aspart  0-15 Units Subcutaneous Q4H  . insulin aspart  1 Units Subcutaneous Q4H  . insulin glargine  10 Units Subcutaneous Q24H  . ipratropium-albuterol  3 mL Nebulization Q6H  . levofloxacin (LEVAQUIN) IV  500 mg Intravenous Q24H  . linezolid (ZYVOX) IV  600 mg Intravenous Q12H  .  meropenem (MERREM) IV  1 g Intravenous Q12H  . methylPREDNISolone (SOLU-MEDROL) injection  40 mg Intravenous Q12H    Objective: Vital signs in last 24 hours: Temp:  [96.3 F (35.7 C)-99.5 F (37.5 C)] 98.1 F (36.7 C) (07/27 1400) Pulse Rate:  [67-79] 79 (07/26 2345) Resp:  [21-33] 25 (07/27 1400) BP: (89-145)/(44-71) 123/54 (07/27 1400) SpO2:  [89 %-97 %] 96 % (07/27 1400) FiO2 (%):  [50 %] 50 % (07/27 1400) Weight:  [71.6 kg (157 lb 13.6 oz)] 71.6 kg (157 lb 13.6 oz) (07/27 0404)   General appearance: no distress and more awake yesterday, eyes open yesterday.  Resp: rhonchi bilaterally Cardio: regular rate and rhythm GI: normal findings: soft, non-tender and abnormal findings:  hypoactive bowel sounds  Lab Results  Recent Labs  01/05/16 0400 01/05/16 1514 01/06/16 0410  WBC 18.1*  --  23.9*  HGB  10.6*  --  11.1*  HCT 33.9*  --  36.4*  NA 136 136 135  136  K 4.5 4.5 4.5  4.5  CL 108 105 104  105  CO2 24 26 27  26   BUN 28* 22* 26*  25*  CREATININE 1.60* 1.25* 1.36*  1.34*   Liver Panel  Recent Labs  01/03/16 1844  01/05/16 1514 01/06/16 0410  PROT 7.1  --   --   --   ALBUMIN 2.1*  < > 2.0* 2.1*  AST 56*  --   --   --   ALT 40  --   --   --   ALKPHOS 127*  --   --   --   BILITOT 0.8  --   --   --   < > = values in this interval not displayed. Sedimentation Rate  Recent Labs  01/03/16 2045  ESRSEDRATE 80*   C-Reactive Protein  Recent Labs  01/03/16 2045  CRP 19.7*    Microbiology: Recent Results (from the past 240 hour(s))  MRSA PCR Screening     Status: None   Collection Time: 01/03/16  5:54 PM  Result Value Ref Range Status   MRSA by PCR NEGATIVE NEGATIVE Final    Comment:        The GeneXpert MRSA Assay (FDA approved for NASAL specimens only), is one component of a comprehensive MRSA colonization surveillance program. It is not intended to diagnose MRSA infection nor to guide or monitor treatment for MRSA infections.   Blood Culture (routine x 2)     Status: None (Preliminary result)   Collection Time: 01/03/16  6:44 PM  Result Value Ref Range Status   Specimen Description BLOOD LEFT ANTECUBITAL  Final   Special Requests BOTTLES DRAWN AEROBIC AND ANAEROBIC 5CC  Final   Culture   Final    NO GROWTH 2 DAYS Performed at [facility]    Report Status PENDING  Incomplete  Blood Culture (routine x 2)     Status: Abnormal   Collection Time: 01/03/16  6:50 PM  Result Value Ref Range Status   Specimen Description BLOOD LEFT HAND  Final   Special Requests BOTTLES DRAWN AEROBIC AND ANAEROBIC 5CC  Final   Culture  Setup Time   Final    GRAM POSITIVE COCCI IN CLUSTERS AEROBIC BOTTLE ONLY CRITICAL RESULT CALLED TO, READ BACK BY AND VERIFIED WITH: E. JACKSON, [facility] AT [place] ON 01/04/16 BY C. JESSUP, MLT.    Culture (A)  Final     STAPHYLOCOCCUS SPECIES (COAGULASE NEGATIVE) THE SIGNIFICANCE OF ISOLATING THIS ORGANISM  FROM A SINGLE SET OF BLOOD CULTURES WHEN MULTIPLE SETS ARE DRAWN IS UNCERTAIN. PLEASE NOTIFY THE MICROBIOLOGY DEPARTMENT WITHIN ONE WEEK IF SPECIATION AND SENSITIVITIES ARE REQUIRED. Performed at [facility]    Report Status 01/05/2016 FINAL  Final  Blood Culture ID Panel (Reflexed)     Status: Abnormal   Collection Time: 01/03/16  6:50 PM  Result Value Ref Range Status   Enterococcus species NOT DETECTED NOT DETECTED Final   Vancomycin resistance NOT DETECTED NOT DETECTED Final   Listeria monocytogenes NOT DETECTED NOT DETECTED Final   Staphylococcus species DETECTED (A) NOT DETECTED Final    Comment: CRITICAL RESULT CALLED TO, READ BACK BY AND VERIFIED WITH: [place], [facility] AT 1840 ON 01/04/16 BY C. JESSUP, MLT.    Staphylococcus aureus NOT DETECTED NOT DETECTED Final   Methicillin resistance NOT DETECTED NOT DETECTED Final   Streptococcus species NOT DETECTED NOT DETECTED Final   Streptococcus agalactiae NOT DETECTED NOT DETECTED Final   Streptococcus pneumoniae NOT DETECTED NOT DETECTED Final   Streptococcus pyogenes NOT DETECTED NOT DETECTED Final   Acinetobacter baumannii NOT DETECTED NOT DETECTED Final   Enterobacteriaceae species NOT DETECTED NOT DETECTED Final   Enterobacter cloacae complex NOT DETECTED NOT DETECTED Final   Escherichia coli NOT DETECTED NOT DETECTED Final   Klebsiella oxytoca NOT DETECTED NOT DETECTED Final   Klebsiella pneumoniae NOT DETECTED NOT DETECTED Final   Proteus species NOT DETECTED NOT DETECTED Final   Serratia marcescens NOT DETECTED NOT DETECTED Final   Carbapenem resistance NOT DETECTED NOT DETECTED Final   Haemophilus influenzae NOT DETECTED NOT DETECTED Final   Neisseria meningitidis NOT DETECTED NOT DETECTED Final   Pseudomonas aeruginosa NOT DETECTED NOT DETECTED Final   Candida albicans NOT DETECTED NOT DETECTED Final   Candida glabrata  NOT DETECTED NOT DETECTED Final   Candida krusei NOT DETECTED NOT DETECTED Final   Candida parapsilosis NOT DETECTED NOT DETECTED Final   Candida tropicalis NOT DETECTED NOT DETECTED Final    Comment: Performed at [facility]  Culture, respiratory (NON-Expectorated)     Status: None   Collection Time: 01/03/16  7:52 PM  Result Value Ref Range Status   Specimen Description THROAT  Final   Special Requests NONE  Final   Gram Stain   Final    ABUNDANT WBC PRESENT,BOTH PMN AND MONONUCLEAR RARE GRAM POSITIVE COCCI IN PAIRS RARE GRAM VARIABLE ROD RARE YEAST    Culture   Final    Consistent with normal respiratory flora. Performed at [facility]    Report Status 01/06/2016 FINAL  Final  Culture, blood (routine x 2)     Status: None (Preliminary result)   Collection Time: 01/03/16  8:45 PM  Result Value Ref Range Status   Specimen Description BLOOD RIGHT ANTECUBITAL  Final   Special Requests BOTTLES DRAWN AEROBIC AND ANAEROBIC 5CC  Final   Culture   Final    NO GROWTH 2 DAYS Performed at [facility]    Report Status PENDING  Incomplete  Culture, blood (routine x 2)     Status: None (Preliminary result)   Collection Time: 01/03/16  8:49 PM  Result Value Ref Range Status   Specimen Description BLOOD RIGHT HAND  Final   Special Requests BOTTLES DRAWN AEROBIC ONLY 5CC  Final   Culture   Final    NO GROWTH 2 DAYS Performed at [facility]    Report Status PENDING  Incomplete  Urine culture  Status: None   Collection Time: 01/03/16 11:15 PM  Result Value Ref Range Status   Specimen Description URINE, CATHETERIZED  Final   Special Requests NONE  Final   Culture NO GROWTH Performed at [facility]   Final   Report Status 01/05/2016 FINAL  Final  Respiratory Panel by PCR     Status: None   Collection Time: 01/03/16 11:20 PM  Result Value Ref Range Status   Adenovirus NOT DETECTED NOT DETECTED Final   Coronavirus 229E NOT DETECTED NOT  DETECTED Final   Coronavirus HKU1 NOT DETECTED NOT DETECTED Final   Coronavirus NL63 NOT DETECTED NOT DETECTED Final   Coronavirus OC43 NOT DETECTED NOT DETECTED Final   Metapneumovirus NOT DETECTED NOT DETECTED Final   Rhinovirus / Enterovirus NOT DETECTED NOT DETECTED Final   Influenza A NOT DETECTED NOT DETECTED Final   Influenza A H1 NOT DETECTED NOT DETECTED Final   Influenza A H1 2009 NOT DETECTED NOT DETECTED Final   Influenza A H3 NOT DETECTED NOT DETECTED Final   Influenza B NOT DETECTED NOT DETECTED Final   Parainfluenza Virus 1 NOT DETECTED NOT DETECTED Final   Parainfluenza Virus 2 NOT DETECTED NOT DETECTED Final   Parainfluenza Virus 3 NOT DETECTED NOT DETECTED Final   Parainfluenza Virus 4 NOT DETECTED NOT DETECTED Final   Respiratory Syncytial Virus NOT DETECTED NOT DETECTED Final   Bordetella pertussis NOT DETECTED NOT DETECTED Final   Chlamydophila pneumoniae NOT DETECTED NOT DETECTED Final   Mycoplasma pneumoniae NOT DETECTED NOT DETECTED Final    Comment: Performed at [facility]  Culture, respiratory (NON-Expectorated)     Status: None (Preliminary result)   Collection Time: 01/06/16  9:45 AM  Result Value Ref Range Status   Specimen Description TRACHEAL ASPIRATE  Final   Special Requests Normal  Final   Gram Stain   Final    NO WBC SEEN RARE GRAM POSITIVE COCCI IN PAIRS AND CHAINS RARE YEAST RARE SQUAMOUS EPITHELIAL CELLS PRESENT Performed at [facility]    Culture PENDING  Incomplete   Report Status PENDING  Incomplete    Studies/Results: Dg Chest Port 1 View  Result Date: 01/06/2016 CLINICAL DATA:  ARDS.  Chronic kidney disease. EXAM: PORTABLE CHEST 1 VIEW COMPARISON:  01/05/2016 and 01/04/2016 FINDINGS: Nasogastric tube, endotracheal tube, left subclavian central venous catheter and right IJ venous catheter unchanged. Lungs are hypoinflated with bilateral patchy mixed interstitial airspace density without significant change. No evidence  of effusion. Mild stable cardiomegaly. Remainder of the exam is unchanged. IMPRESSION: Stable diffuse bilateral patchy mixed interstitial airspace process. Tubes and lines unchanged. Electronically Signed   By: Marin Olp M.D.   On: 01/06/2016 10:31  Dg Chest Port 1 View  Result Date: 01/05/2016 CLINICAL DATA:  ARDS. EXAM: PORTABLE CHEST 1 VIEW COMPARISON:  01/04/2016 FINDINGS: Nasogastric tube, endotracheal tube, left subclavian central venous catheter and right IJ venous catheter unchanged. Lungs are adequately inflated demonstrate mild bilateral patchy hazy mixed interstitial airspace density without significant change. No definite effusion. Mild stable cardiomegaly. Curvature of the spine convex right with partially visualized stabilization hardware intact. IMPRESSION: Stable hazy patch mixed interstitial airspace density bilaterally. Tubes and lines unchanged. Electronically Signed   By: Marin Olp M.D.   On: 01/05/2016 09:57    Assessment/Plan: HCAP Trach Cx "resp flora" (unfortunately his BAL did not make it to lab) BCx 1/2 CNS (contaminant), UCx negative. Resp virus panel (-) CXR unchanged Check legionella Ag TTE EF 55-60% Steroids causing increased  WBC Will add eraxis with yeast seen on trach Cx. Given his lack of improvement question if yeast is a pathogen or normal flora.  Possible w/u for underlying RA, non-infectious lung disease.   My great appreciation to CCM and pharmacy  CKD Cr stable (CVVHD) PH slightly worse Prev Cr 1.6 Appreciate renal f/u.   Antibiotic days: 2 zyvox/merrem/levaquin, 0 eraxis         Bobby Rumpf Infectious Diseases (pager) [phone] [email or web address] 01/06/2016, 3:01 PM  LOS: 3 days

## 2016-01-06 NOTE — Progress Notes (Signed)
PULMONARY / CRITICAL CARE MEDICINE   Name: Curtis Frazier MRN: [id number] DOB: 07-24-1940    ADMISSION DATE:  01/03/2016 CONSULTATION DATE:  01/03/2016  REFERRING MD:  Dr. Dayna Barker EDP  CHIEF COMPLAINT:  SOB/AMS  BRIEF: 75 y/o male with a rheumatoid arthritis and a recent admission for acute respiratory failure with hypoxemia presumably due to HCAP was re-admitted to [facility] on 7/24 with recurrent acute on chronic hypoxemic respiratory failure with a picture consistent with ARDS.   SUBJECTIVE:  Oxygenation about the same today Remains on vasopressors Remains on cvvhd Tolerating feeding   VITAL SIGNS: BP (!) 92/53 (BP Location: Right Arm)   Pulse 79   Temp 99 F (37.2 C)   Resp (!) 21   Ht 5\' 8"  (1.727 m)   Wt 157 lb 13.6 oz (71.6 kg)   SpO2 92%   BMI 24.00 kg/m   HEMODYNAMICS: CVP:  [10 mmHg-17 mmHg] 12 mmHg  VENTILATOR SETTINGS: Vent Mode: PRVC FiO2 (%):  [50 %] 50 % Set Rate:  [33 bmp] 33 bmp Vt Set:  [480 mL] 480 mL PEEP:  [8 cmH20-10 cmH20] 10 cmH20 Plateau Pressure:  [18 cmH20-22 cmH20] 21 cmH20  INTAKE / OUTPUT: I/O last 3 completed shifts: In: 7620.3 [I.V.:4521.3; Other:34; NG/GT:1665; IV Piggyback:1400] Out: 8136 [Urine:586; Emesis/NG output:27; Other:7523]  PHYSICAL EXAMINATION: General:  sedated on vent HENT: NCAT ETT in place PULM: vent supported breaths, clear CV: RRR, no mgr GI: BS+, soft, nontender MSK: normal bulk and tone Neuro: heavily sedated, paralyzed  LABS:  BMET  Recent Labs Lab 01/05/16 0400 01/05/16 1514 01/06/16 0410  NA 136 136 135  136  K 4.5 4.5 4.5  4.5  CL 108 105 104  105  CO2 24 26 27  26   BUN 28* 22* 26*  25*  CREATININE 1.60* 1.25* 1.36*  1.34*  GLUCOSE 125* 135* 135*  138*    Electrolytes  Recent Labs Lab 01/04/16 1601 01/05/16 0400 01/05/16 1514 01/06/16 0410  CALCIUM  --  8.0* 8.0* 8.3*  8.3*  MG 1.8 1.9  --  2.3  PHOS 5.9* 3.5 2.7 2.8    CBC  Recent Labs Lab 01/04/16 0307 01/05/16 0400  01/06/16 0410  WBC 15.1* 18.1* 23.9*  HGB 11.1* 10.6* 11.1*  HCT 35.2* 33.9* 36.4*  PLT 328 164 155    Coag's  Recent Labs Lab 01/04/16 0307 01/05/16 0400 01/05/16 0414 01/06/16 0410  APTT  --  43*  --  51*  INR 2.34*  --  2.80 2.93    Sepsis Markers  Recent Labs Lab 01/03/16 1905  LATICACIDVEN 1.19    ABG  Recent Labs Lab 01/04/16 0725 01/04/16 1220 01/05/16 0858  PHART 7.117* 7.120* 7.300*  PCO2ART 69.8* 64.3* 49.9*  PO2ART 73.9* 91.3 57.9*    Liver Enzymes  Recent Labs Lab 01/03/16 1844  01/05/16 0400 01/05/16 1514 01/06/16 0410  AST 56*  --   --   --   --   ALT 40  --   --   --   --   ALKPHOS 127*  --   --   --   --   BILITOT 0.8  --   --   --   --   ALBUMIN 2.1*  < > 1.9* 2.0* 2.1*  < > = values in this interval not displayed.  Cardiac Enzymes  Recent Labs Lab 01/03/16 1844  TROPONINI 0.13*    Glucose  Recent Labs Lab 01/05/16 0953 01/05/16 1053 01/05/16 1155 01/05/16 1307 01/05/16  1409 01/05/16 1514  GLUCAP 216* 232* 201* 175* 142* 116*    Imaging No results found.   STUDIES:  CTA chest 7/15> No CT evidence for acute pulmonary embolus. Interlobular septal thickening with bilateral ground-glass attenuation. Imaging features are compatible with pulmonary edema although superimposed infectious/inflammatory etiology is not excluded. Bibasilar collapse/ consolidation. Interval progression of mediastinal and right hilar lymphadenopathy, nonspecific.   CULTURES: BC x 2 7/24 > staph species Urine 7/24 > BAL 7/24 > not sent RVP 7/24 > negative PCP 7/24 > not sent  ANTIBIOTICS: Aztreonam 7/24 >7/25 Meropenem 7/25 >  Levaquin 7/24 > Vancomycin 7/24 > 7/25 Linezolid 7/25 >   SIGNIFICANT EVENTS: 7/22 discharge to SNF  LINES/TUBES: ETT 7/24 > L subclavian CVL 7/25 >  R IJ HD cath 7/25 >   DISCUSSION: 75 year old male with baseline RA and COPD and a recent admission for acute respiratory failure with hypoxemia was  admitted on 7/24 with ARDS.  DDx includes HCAP, opportunistic infection, or less likely an inflammatory process related to his baseline RA.  Has multi-organ failure.  As of 7/27 he is culture negative, slight improvement compared to admission.  ASSESSMENT / PLAN:   PULMONARY A: Acute hypoxemic respiratory failure > see discussion above ARDS > improved oxygenation overnight COPD/emphysema HCAP  Mediastinal lymphadenopathy  P:   ARDS protocol > maintain FiO2/PEEP per protocol, goal PaO2 55-65, goal pH > 7.15 VAP prevention SBT/WUA when FiO2/PEEP improved ABG daily while on ARDS protocol CXR daily    CARDIOVASCULAR A:  Circulator Shock> sedation related?  Chronic diastolic CHF Afib s/p ablation P:  Telemetry  MAP goal > 64mmHg Continue neosynephrine for MAP > 65  RENAL A:   Acute on Chronic kidney disease with worsening metabolic acidosis P:   Appreciate renal Continue CVVHD  GASTROINTESTINAL A:   No acute issues P:   Continue tube feeding Pepcid for SUP  HEMATOLOGIC A:   Warfarin induced coagulopathy P:  Follow CBC Repeat INR in AM  May need heparin, follow INR  INFECTIOUS A:   HCAP DDx lung process: Opportunistic? Autoimmune process Staph bacteremia> presumably contaminant P:   Antibiotics per ID Repeat blood, respiratory cultures, PJP testing 7/27 Hold off on stronger immunosuppressants 7/27, continue solumedrol  ENDOCRINE A:   Chronic prednisone use P:   Continue solumedrol at current dosing CBG monitoring and SSI  NEUROLOGIC A:   Acute metabolic encephalopathy due to hypoxemia and medical sedation P:   RASS goal: -2 Fentanyl infusion Versed infusion   FAMILY  - Updates: daughter updated 7/27 at length  - Inter-disciplinary family meet or Palliative Care meeting due by: 7/30    CC time 35 minutes    Roselie Awkward, MD [place] Pager: [phone] Cell: [phone] After 3pm or if no response, call [phone]  01/06/2016  10:02 AM

## 2016-01-06 NOTE — Progress Notes (Signed)
[facility] Progress Note   Subjective: remains on vent, FiO2 down 50%, on high PEEP 10%, I/O even, wt's stable, CVP 10-12, on pressors and sedation, paralytics off  Vitals:   01/06/16 0800 01/06/16 0819 01/06/16 0820 01/06/16 0900  BP: (!) 101/51   (!) 92/53  Pulse:      Resp: (!) 29   (!) 24  Temp: 97.5 F (36.4 C)   98.1 F (36.7 C)  TempSrc:    Core (Comment)  SpO2: 94% 92% 92% 90%  Weight:      Height:        Inpatient medications: . antiseptic oral rinse  7 mL Mouth Rinse QID  . chlorhexidine gluconate (SAGE KIT)  15 mL Mouth Rinse BID  . famotidine (PEPCID) IV  20 mg Intravenous Q24H  . feeding supplement (PRO-STAT SUGAR FREE 64)  30 mL Per Tube TID  . ipratropium-albuterol  3 mL Nebulization Q6H  . levofloxacin (LEVAQUIN) IV  500 mg Intravenous Q24H  . linezolid (ZYVOX) IV  600 mg Intravenous Q12H  . meropenem (MERREM) IV  1 g Intravenous Q12H  . methylPREDNISolone (SOLU-MEDROL) injection  40 mg Intravenous Q12H   . sodium chloride 75 mL/hr at 01/06/16 0900  . feeding supplement (VITAL 1.5 CAL) 1,000 mL (01/06/16 0900)  . fentaNYL infusion INTRAVENOUS 100 mcg/hr (01/06/16 0900)  . insulin (NOVOLIN-R) infusion 5.4 mL/hr at 01/06/16 0900  . midazolam (VERSED) infusion 1 mg/hr (01/06/16 0900)  . phenylephrine (NEO-SYNEPHRINE) Adult infusion 50.133 mcg/min (01/06/16 0900)  . dialysis replacement fluid (prismasate) 400 mL/hr at 01/06/16 0508  . dialysis replacement fluid (prismasate) 200 mL/hr at 01/05/16 1633  . dialysate (PRISMASATE) 2,000 mL/hr at 01/06/16 0827   sodium chloride, sodium chloride, albuterol, alteplase, fentaNYL, heparin, midazolam  Exam: Gen on vent, unresponsive, sedated Sclera anicteric, throat w ETT  No jvd or bruits Chest some post crackles at bases RRR no MRG Abd soft nondistended no ascites or mass GU normal male w Foley Ext no LE or UE edema Neuro as above  CT chest > underlying emphysema w bilat patchy infiltrates/  ground glass changes, LL > UL's CXR 7.25 > patchy bilat infiltrates UA - negative CVP 10-12  Assessment: 1  Acute on CRF3 - making urine, CRRT for severe acidemia/ resp acidosis, D#3 2  Recurrent resp failure - on vent for CO2 retention and hypoxemia, FiO2 50%; unclear pulm process, recurrent PNA vs other (ILI, ARDS, autoimmune) 3  Volume - CVP normal, no gross excess on exam 4  COPD underlying 5  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  Plan - cont CRRT , keep even, no hep   Kelly Splinter MD [facility] pager [phone]    cell [phone] 01/06/2016, 9:23 AM    Recent Labs Lab 01/05/16 0400 01/05/16 1514 01/06/16 0410  NA 136 136 135  136  K 4.5 4.5 4.5  4.5  CL 108 105 104  105  CO2 24 26 27  26   GLUCOSE 125* 135* 135*  138*  BUN 28* 22* 26*  25*  CREATININE 1.60* 1.25* 1.36*  1.34*  CALCIUM 8.0* 8.0* 8.3*  8.3*  PHOS 3.5 2.7 2.8    Recent Labs Lab 01/03/16 1844  01/05/16 0400 01/05/16 1514 01/06/16 0410  AST 56*  --   --   --   --   ALT 40  --   --   --   --   ALKPHOS 127*  --   --   --   --  BILITOT 0.8  --   --   --   --   PROT 7.1  --   --   --   --   ALBUMIN 2.1*  < > 1.9* 2.0* 2.1*  < > = values in this interval not displayed.  Recent Labs Lab 01/03/16 1844 01/04/16 0307 01/05/16 0400 01/06/16 0410  WBC 11.6* 15.1* 18.1* 23.9*  NEUTROABS 9.2*  --   --  22.0*  HGB 11.4* 11.1* 10.6* 11.1*  HCT 35.8* 35.2* 33.9* 36.4*  MCV 92.5 94.6 94.7 96.6  PLT 301 328 164 155   Iron/TIBC/Ferritin/ %Sat    Component Value Date/Time   IRON 24 (L) 08/13/2015 0430   TIBC 258 08/13/2015 0430   FERRITIN 147 08/13/2015 0430   IRONPCTSAT 9 (L) 08/13/2015 0430

## 2016-01-06 NOTE — Progress Notes (Signed)
Pharmacy: [facility]   Assessment: Pharmacy was asked to assist with Eraxis dosing to cover yeast on trach Cx in patient who is on CRRT.  Plan: Will give Eraxis 200mg  IV x 1 then 100mg  IV Q24h. Will sign off. Please reconsult if needed.  Thanks, Garnet Sierras, PharmD 01/06/2016

## 2016-01-06 NOTE — Progress Notes (Signed)
Nutrition Follow-up  DOCUMENTATION CODES:   Non-severe (moderate) malnutrition in context of acute illness/injury  INTERVENTION:  - Will change TF order: Vital 1.2 @ 55 mL/hr with 30 mL Prostat once/day. This regimen will provide 1684 kcal, 114 grams of protein, and 1071 mL free water.  - Continue PEPuP protocol. - RD will follow-up 7/28.  NUTRITION DIAGNOSIS:   Inadequate oral intake related to inability to eat as evidenced by NPO status. -ongoing  GOAL:   Patient will meet greater than or equal to 90% of their needs -met with current TF order.  MONITOR:   Vent status, TF tolerance, Weight trends, Labs, Skin, I & O's  ASSESSMENT:   75 year old male with PMH as below which is significant for Diastolic CHF, CKD, COPD, PAF (s/p recent ablation), and rheumatoid arthritis (recently on remicade, now on chronic pred 25m). He has several recent hospitalizations. Most recently he was admitted 7/15 for presumed HCAP. He was streated with 9 days of HCAP antibiotics [facility] 7/24 he was noted to have AMS to the point where he was essentially comatose. O2 sats noted to be about 60% on room air at that time. [facility] he was placed on BiPAP with some improvement. CXR was performed which is consistent with interstitial lung changes noted on very recent CT.  7/27 Pt remains on CVVHD. OGT in place and pt receiving TF at goal rate: Vital 1.5 @ 45 mL/hr with 30 mL of Prostat TID which is providing 1920 kcal, 118 grams of protein, and 825 mL free water. Estimated nutrition needs updated at this time based on current Ve and Tmax with admission weight (71.1 kg) used to calculate needs. Daughter is at bedside this AM. RN reports that pt is tolerating TF regimen without issue.  Patient is currently intubated on ventilator support MV: 10.2 L/min Temp (24hrs), Avg:97.6 F (36.4 C), Min:96.3 F (35.7 C), Max:99 F (37.2 C) Propofol:  none  Will change TF regimen as outlined above and follow-up tomorrow.  Medications reviewed; 40 mg IV Solu-medrol every 12 hours. Labs reviewed; BUN: 26 mg/dL, creatinine: 1.36 mg/dL, Ca: 8.3 mg/dL, GFR: 40 mL/min; K, Phos, and Mg WDL.  Drips: Fentanyl @ 100 mcg/hr, Versed @ 1 mg/hr, Neo @ 50 mcg/min, Insulin @ 13.3 units/hr.  IVF: NS @ 75 mL/hr.     7/26 - Pt remains with OGT in place and currently receiving TF at goal rate: Nepro @ 40 mL/hr with 30 mL Prostat once/day and 50 mL free water every 6 hours; this is providing 1828 kcal, 93 grams of protein, and 898 mL of free water.  - HD catheter was placed yesterday ~1130 and CRRT started yesterday.  - Estimated nutrition needs adjusted based on vent, CRRT. Admission weight of 71.1 kg used as weight is up since that time.  - Patient is currently intubated on ventilator support; MV: 15.4 L/min; Temp (24hrs), Avg:96.7 F Max:99.3 F (37.4 C); Propofol: none - Will change TF order: Vital 1.5 @ 45 mL/hr with 30 mL Prostat TID. This regimen will provide 1920 kcal, 118 grams of protein, and 825 mL free water. - Free water, if desired, to be ordered by MD/NP given CRRT. - Pt meets criteria for malnutrition based on mild/moderate muscle wasting to upper body and 5.5% body weight loss in the past 1 month.   IVF: NS @ 75 mL/hr.  Drips: Fentanyl @ 200 mcg/hr, Versed @ 2 mg/hr,  Neo @ 45 mcg/min, insulin @ 2.6 units/hr.   7/25 - Spoke with pt's daughter who was at bedside this AM.  - Pt was recently d/c'ed from the hospital (7/22) and daughter states pt was at a nursing home following d/c until admission yesterday.  - She states pt had a good appetite and was eating well and that he ate well on 7/23 but did not have an appetite 7/24.  - Pt does not have any chewing or swallowing difficulties. - Pt has not complained of any abdominal pain or nausea with PO intakes PTA.  - Daughter states that pt's UBW is "in the 160s" and that he last weighed  this prior to last admission.  - She states when she saw d/c papers from that admission it was noted that pt had lost a few pounds.  - Per chart review, pt has lost 9 lbs (5.5% body weight) in the past 1 month which is significant for time frame.  - Unable to complete physical assessment at this time; will complete tomorrow and document any findings of malnutrition at that time. - Patient is currently intubated on ventilator support with OGT in place. ; MV: 13.2L/min; Temp  Max:100 F (37.8 C); Propofol: none - Will order TF as outlined above. Pt with hx of stage 3 CKD and central venous catheter was placed this AM.  - Per Dr. Anastasia Pall note this UJ:WJXBJ consult, will likely need CVVHD given acidosis. Will monitor for this and adjusted estimated nutrition needs as warranted.  - K: 5.5 mmol/L, BUN: 43 mg/dL, creatinine: 2.33 mg/dL, Phos: 7 mg/dL, Mg: 1.6 mg/dL, GFR: 26 mL/min.  IVF: NS @ 75 mL/hr. Drips:Nimbex @ 3 mcg/kg/min, Versed @ 3 mg/hr.   Diet Order:  Diet NPO time specified  Skin:  Wound (see comment) (L ear bruising)  Last BM:  PTA  Height:   Ht Readings from Last 1 Encounters:  01/05/16 _0  (1.727 m)    Weight:   Wt Readings from Last 1 Encounters:  01/06/16 157 lb 13.6 oz (71.6 kg)    Ideal Body Weight:  70 kg (kg)  BMI:  Body mass index is 24 kg/m.  Estimated Nutritional Needs:   Kcal:  4782  Protein:  107-121 grams (1.5-1.7 grams/kg)  Fluid:  1.6-1.8 L/day  EDUCATION NEEDS:   No education needs identified at this time    Jarome Matin, MS, RD, LDN Inpatient Clinical Dietitian Pager # [phone] After hours/weekend pager # [phone]

## 2016-01-07 DIAGNOSIS — E440 Moderate protein-calorie malnutrition: Secondary | ICD-10-CM

## 2016-01-07 LAB — BLOOD GAS, ARTERIAL
ACID-BASE DEFICIT: 2 mmol/L (ref 0.0–2.0)
Acid-base deficit: 0.9 mmol/L (ref 0.0–2.0)
Bicarbonate: 26.4 mEq/L — ABNORMAL HIGH (ref 20.0–24.0)
Bicarbonate: 24.4 mEq/L — ABNORMAL HIGH (ref 20.0–24.0)
DRAWN BY: 252031
DRAWN BY: 406621
FIO2: 0.5
FIO2: 0.4
LHR: 14 {breaths}/min
MECHVT: 480 mL
MECHVT: 480 mL
O2 SAT: 91 %
O2 Saturation: 93.5 %
PCO2 ART: 59.8 mmHg — AB (ref 35.0–45.0)
PEEP/CPAP: 8 cmH2O
PEEP/CPAP: 5 cmH2O
PH ART: 7.292 — AB (ref 7.350–7.450)
PO2 ART: 74.8 mmHg — AB (ref 80.0–100.0)
PO2 ART: 64.8 mmHg — AB (ref 80.0–100.0)
Patient temperature: 98.6
Patient temperature: 98.6
RATE: 25 resp/min
TCO2: 24.8 mmol/L (ref 0–100)
TCO2: 22.9 mmol/L (ref 0–100)
pCO2 arterial: 52.2 mmHg — ABNORMAL HIGH (ref 35.0–45.0)
pH, Arterial: 7.267 — ABNORMAL LOW (ref 7.350–7.450)

## 2016-01-07 LAB — RENAL FUNCTION PANEL
ALBUMIN: 2.3 g/dL — AB (ref 3.5–5.0)
ANION GAP: 5 (ref 5–15)
Albumin: 2.2 g/dL — ABNORMAL LOW (ref 3.5–5.0)
Anion gap: 5 (ref 5–15)
BUN: 27 mg/dL — ABNORMAL HIGH (ref 6–20)
BUN: 27 mg/dL — AB (ref 6–20)
CHLORIDE: 103 mmol/L (ref 101–111)
CHLORIDE: 102 mmol/L (ref 101–111)
CO2: 26 mmol/L (ref 22–32)
CO2: 27 mmol/L (ref 22–32)
CREATININE: 1.63 mg/dL — AB (ref 0.61–1.24)
Calcium: 8.3 mg/dL — ABNORMAL LOW (ref 8.9–10.3)
Calcium: 8.1 mg/dL — ABNORMAL LOW (ref 8.9–10.3)
Creatinine, Ser: 1.42 mg/dL — ABNORMAL HIGH (ref 0.61–1.24)
GFR calc non Af Amer: 39 mL/min — ABNORMAL LOW (ref >60)
GFR, EST AFRICAN AMERICAN: 54 mL/min — AB (ref >60)
GFR, EST AFRICAN AMERICAN: 46 mL/min — AB (ref >60)
GFR, EST NON AFRICAN AMERICAN: 46 mL/min — AB (ref >60)
Glucose, Bld: 236 mg/dL — ABNORMAL HIGH (ref 65–99)
Glucose, Bld: 252 mg/dL — ABNORMAL HIGH (ref 65–99)
PHOSPHORUS: 3.6 mg/dL (ref 2.5–4.6)
POTASSIUM: 4.5 mmol/L (ref 3.5–5.1)
Phosphorus: 3.1 mg/dL (ref 2.5–4.6)
Potassium: 4.7 mmol/L (ref 3.5–5.1)
Sodium: 134 mmol/L — ABNORMAL LOW (ref 135–145)
Sodium: 134 mmol/L — ABNORMAL LOW (ref 135–145)

## 2016-01-07 LAB — CBC WITH DIFFERENTIAL/PLATELET
Basophils Absolute: 0 10*3/uL (ref 0.0–0.1)
Basophils Relative: 0 %
EOS ABS: 0 10*3/uL (ref 0.0–0.7)
EOS PCT: 0 %
HCT: 37.5 % — ABNORMAL LOW (ref 39.0–52.0)
Hemoglobin: 11.4 g/dL — ABNORMAL LOW (ref 13.0–17.0)
LYMPHS ABS: 0.3 10*3/uL — AB (ref 0.7–4.0)
LYMPHS PCT: 1 %
MCH: 29.8 pg (ref 26.0–34.0)
MCHC: 30.4 g/dL (ref 30.0–36.0)
MCV: 97.9 fL (ref 78.0–100.0)
MONO ABS: 1.1 10*3/uL — AB (ref 0.1–1.0)
Monocytes Relative: 5 %
Neutro Abs: 19.5 10*3/uL — ABNORMAL HIGH (ref 1.7–7.7)
Neutrophils Relative %: 94 %
PLATELETS: 149 10*3/uL — AB (ref 150–400)
RBC: 3.83 MIL/uL — AB (ref 4.22–5.81)
RDW: 18.5 % — AB (ref 11.5–15.5)
WBC: 20.9 10*3/uL — AB (ref 4.0–10.5)

## 2016-01-07 LAB — TROPONIN I
TROPONIN I: 0.04 ng/mL — AB (ref <0.03)
Troponin I: 0.05 ng/mL (ref <0.03)
Troponin I: 0.04 ng/mL (ref <0.03)

## 2016-01-07 LAB — BASIC METABOLIC PANEL
Anion gap: 5 (ref 5–15)
BUN: 27 mg/dL — AB (ref 6–20)
CO2: 25 mmol/L (ref 22–32)
CREATININE: 1.57 mg/dL — AB (ref 0.61–1.24)
Calcium: 8.3 mg/dL — ABNORMAL LOW (ref 8.9–10.3)
Chloride: 104 mmol/L (ref 101–111)
GFR calc Af Amer: 48 mL/min — ABNORMAL LOW (ref >60)
GFR, EST NON AFRICAN AMERICAN: 41 mL/min — AB (ref >60)
GLUCOSE: 233 mg/dL — AB (ref 65–99)
POTASSIUM: 4.5 mmol/L (ref 3.5–5.1)
SODIUM: 134 mmol/L — AB (ref 135–145)

## 2016-01-07 LAB — PNEUMOCYSTIS JIROVECI SMEAR BY DFA: PNEUMOCYSTIS JIROVECI AG: NEGATIVE

## 2016-01-07 LAB — PROTIME-INR
INR: 2.76
Prothrombin Time: 29.7 seconds — ABNORMAL HIGH (ref 11.4–15.2)

## 2016-01-07 LAB — GLUCOSE, CAPILLARY
GLUCOSE-CAPILLARY: 133 mg/dL — AB (ref 65–99)
GLUCOSE-CAPILLARY: 230 mg/dL — AB (ref 65–99)
GLUCOSE-CAPILLARY: 273 mg/dL — AB (ref 65–99)
GLUCOSE-CAPILLARY: 290 mg/dL — AB (ref 65–99)
Glucose-Capillary: 284 mg/dL — ABNORMAL HIGH (ref 65–99)
Glucose-Capillary: 169 mg/dL — ABNORMAL HIGH (ref 65–99)
Glucose-Capillary: 316 mg/dL — ABNORMAL HIGH (ref 65–99)
Glucose-Capillary: 235 mg/dL — ABNORMAL HIGH (ref 65–99)

## 2016-01-07 LAB — MAGNESIUM: MAGNESIUM: 2.5 mg/dL — AB (ref 1.7–2.4)

## 2016-01-07 LAB — APTT: APTT: 44 s — AB (ref 24–36)

## 2016-01-07 MED ORDER — ASPIRIN EC 81 MG PO TBEC: 81.0000 mg | DELAYED_RELEASE_TABLET | Freq: Every day | ORAL | Status: DC

## 2016-01-07 MED ORDER — VITAMINS A & D EX OINT
TOPICAL_OINTMENT | CUTANEOUS | Status: AC
  Administered 2016-01-07: 1
  Filled 2016-01-07: qty 5

## 2016-01-07 MED ORDER — ASPIRIN 81 MG PO CHEW
81.0000 mg | CHEWABLE_TABLET | Freq: Every day | ORAL | Status: DC
  Administered 2016-01-07 – 2016-01-10 (×4): 81 mg via ORAL
  Filled 2016-01-07 (×5): qty 1

## 2016-01-07 MED ORDER — VITAL AF 1.2 CAL PO LIQD
1000.0000 mL | ORAL | Status: DC
  Administered 2016-01-07 – 2016-01-10 (×5): 1000 mL
  Filled 2016-01-07 (×6): qty 1000

## 2016-01-07 MED ORDER — CHLORHEXIDINE GLUCONATE 0.12% ORAL RINSE (MEDLINE KIT)
15.0000 mL | Freq: Two times a day (BID) | OROMUCOSAL | Status: DC
  Administered 2016-01-07 – 2016-01-18 (×22): 15 mL via OROMUCOSAL

## 2016-01-07 MED ORDER — ANTISEPTIC ORAL RINSE SOLUTION (CORINZ)
7.0000 mL | Freq: Four times a day (QID) | OROMUCOSAL | Status: DC
  Administered 2016-01-07 – 2016-01-18 (×47): 7 mL via OROMUCOSAL

## 2016-01-07 MED ORDER — IPRATROPIUM BROMIDE 0.02 % IN SOLN: 0.5000 mg | RESPIRATORY_TRACT | Status: DC | PRN

## 2016-01-07 NOTE — Progress Notes (Signed)
Nutrition Follow-up  DOCUMENTATION CODES:   Non-severe (moderate) malnutrition in context of acute illness/injury  INTERVENTION:  - Will adjust TF order: Vital 1.2 @ 60 mL/hr without Prostat; this will provide 1728 kcal, 108 grams of protein, and 1168 mL free water. - Free water flush per MD/NP for pt on CVVHD.  - Continue PEPuP protocol. - RD will follow-up 7/31.  NUTRITION DIAGNOSIS:   Inadequate oral intake related to inability to eat as evidenced by NPO status. -ongoing  GOAL:   Patient will meet greater than or equal to 90% of their needs -met with current TF regimen.   MONITOR:   Vent status, TF tolerance, Weight trends, Labs, Skin, I & O's  ASSESSMENT:   75 year old male with PMH as below which is significant for Diastolic CHF, CKD, COPD, PAF (s/p recent ablation), and rheumatoid arthritis (recently on remicade, now on chronic pred 65m). He has several recent hospitalizations. Most recently he was admitted 7/15 for presumed HCAP. He was stre[facility] with 9 days [facility] antibiotics [facility] 7/24 he was noted to have AMS to the point where he was essentially comatose. O2 sats noted to be about 60% on room air at that time. [facility] he was placed on BiPAP with some improvement. CXR was performed which is consistent with interstitial lung changes noted on very recent CT.  7/28 Pt continues on CVVHD (day #4) with OGT in place. Currently receiving TF at goal rate per order: Vital 1.2 @ 55 mL/hr with 30 mL Prostat once/day which is providing 1684 kcal, 114 grams of protein, and 1071 mL free water.   Patient is currently intubated on ventilator support MV: 11.3 L/min Temp (24hrs), Avg:98.4 F (36.9 C), Min:97 F (36.1 C), Max:99 F (37.2 C) Propofol: none  Estimated kcal needs updated this AM; continue to use admission weight of 71.1 kg although weight is now beginning to drop closer to admission weight.  Nephrology note indicates pt volume now WDL/no fluid overload. Notes also indicated possible STEMI overnight which has now been ruled-out.    Medications reviewed; sliding scale Novolog, 40 mg IV Solu-medrol every 12 hours.  Labs reviewed; CBGs: 273 and 290 mg/dL this AM, Na: 134 mmol/L, BUN: 27 mg/dL, creatinine: 1.42 mg/dL, Ca: 8.3 mg/dL, Mg: 2.5 mg/dL, GFR: 46 mL/min. Drips: Neo @ 33 mcg/min, Fentanyl @ 100 mcg/hr.    7/27 - Pt remains on CVVHD.  - OGT in place and pt receiving TF at goal rate: Vital 1.5 @ 45 mL/hr with 30 mL of Prostat TID which is providing 1920 kcal, 118 grams of protein, and 825 mL free water. - Estimated nutrition needs updated at this time based on current Ve and Tmax with admission weight (71.1 kg) used to calculate needs.  - Patient is currently intubated on ventilator support; MV: 10.2 L/min; Temp (24hrs), Avg:97.6 F Max:99 F (37.2 C); Propofol: none  Drips: Fentanyl @ 100 mcg/hr, Versed @ 1 mg/hr, Neo @ 50 mcg/min, Insulin @ 13.3 units/hr.  IVF: NS @ 75 mL/hr.     7/26 - Pt remains with OGT in place and currently receiving TF at goal rate: Nepro @ 40 mL/hr with 30 mL Prostat once/day and 50 mL free water every 6 hours; this is providing 1828 kcal, 93 grams of protein, and 898 mL of free water.  - HD catheter was placed yesterday ~1130 and CRRT started yesterday.  - Estimated nutrition needs adjusted  based on vent, CRRT. Admission weight of 71.1 kg used as weight is up since that time.  - Patient is currently intubated on ventilator support; MV: 15.4L/min; Temp (24hrs), Avg:96.7 F Max:99.3 F (37.4 C); Propofol: none - Will change TF order: Vital 1.5 @ 45 mL/hr with 30 mL Prostat TID. This regimen will provide 1920 kcal, 118 grams of protein, and 825 mL free water. - Free water, if desired, to be ordered by MD/NP given CRRT. - Pt meets criteria for malnutrition based on mild/moderate muscle wasting to upper body and 5.5% body weight loss in the past 1  month.   IVF: NS @ 75 mL/hr.  Drips:Fentanyl @ 200 mcg/hr, Versed @ 2 mg/hr, Neo @ 45 mcg/min, insulin @ 2.6 units/hr.   Diet Order:  Diet NPO time specified  Skin:  Wound (see comment) (L ear bruising)  Last BM:  PTA  Height:   Ht Readings from Last 1 Encounters:  01/05/16 5' 8"  (1.727 m)    Weight:   Wt Readings from Last 1 Encounters:  01/07/16 158 lb 11.7 oz (72 kg)    Ideal Body Weight:  70 kg (kg)  BMI:  Body mass index is 24.14 kg/m.  Estimated Nutritional Needs:   Kcal:  1840  Protein:  107-121 grams (1.5-1.7 grams/kg)  Fluid:  1.6-1.8 L/day  EDUCATION NEEDS:   No education needs identified at this time    Jarome Matin, MS, RD, LDN Inpatient Clinical Dietitian Pager # [phone] After hours/weekend pager # [phone]

## 2016-01-07 NOTE — Progress Notes (Addendum)
PULMONARY / CRITICAL CARE MEDICINE   Name: Curtis Frazier MRN: [id number] DOB: [birth year]    ADMISSION DATE:  01/03/2016 CONSULTATION DATE:  01/03/2016  REFERRING MD:  Dr. Dayna Barker EDP  CHIEF COMPLAINT:  SOB/AMS  BRIEF: 75 y/o male with a rheumatoid arthritis and a recent admission for acute respiratory failure with hypoxemia presumably due to HCAP was re-admitted to [facility] on 7/24 with recurrent acute on chronic hypoxemic respiratory failure with a picture consistent with ARDS.   SUBJECTIVE:  Oxygenation improved Abnormal EKG overnight Oliguric   VITAL SIGNS: BP (!) 103/52   Pulse 72   Temp 98.1 F (36.7 C)   Resp (!) 25   Ht 5\' 8"  (1.727 m)   Wt 158 lb 11.7 oz (72 kg)   SpO2 99%   BMI 24.14 kg/m   HEMODYNAMICS: CVP:  [9 mmHg-22 mmHg] 9 mmHg  VENTILATOR SETTINGS: Vent Mode: PRVC FiO2 (%):  [50 %] 50 % Set Rate:  [25 bmp] 25 bmp Vt Set:  [480 mL] 480 mL PEEP:  [8 cmH20-10 cmH20] 8 cmH20 Plateau Pressure:  [17 cmH20-25 cmH20] 18 cmH20  INTAKE / OUTPUT: I/O last 3 completed shifts: In: 7298.2 [I.V.:3565.7; Other:130; NG/GT:1854.5; IV Piggyback:1748] Out: [id number] [Urine:74; Other:7367]  PHYSICAL EXAMINATION: General:  sedated on vent HENT: NCAT ETT in place PULM: vent supported breaths, few crackles  CV: RRR, no mgr GI: BS+, soft, nontender MSK: normal bulk and tone Neuro: heavily sedated, moves spontaneously  LABS:  BMET  Recent Labs Lab 01/06/16 0410 01/06/16 1408 01/07/16 0210  NA 135  136 136 134*  134*  K 4.5  4.5 4.5 4.5  4.5  CL 104  105 103 103  104  CO2 27  26 27 26  25   BUN 26*  25* 20 27*  27*  CREATININE 1.36*  1.34* 1.12 1.42*  1.57*  GLUCOSE 135*  138* 131* 236*  233*    Electrolytes  Recent Labs Lab 01/05/16 0400  01/06/16 0410 01/06/16 1408 01/07/16 0210  CALCIUM 8.0*  < > 8.3*  8.3* 8.3* 8.3*  8.3*  MG 1.9  --  2.3  --  2.5*  PHOS 3.5  < > 2.8 2.2* 3.6  < > = values in this interval not displayed.  CBC  Recent  Labs Lab 01/05/16 0400 01/06/16 0410 01/07/16 0210  WBC 18.1* 23.9* 20.9*  HGB 10.6* 11.1* 11.4*  HCT 33.9* 36.4* 37.5*  PLT 164 155 149*    Coag's  Recent Labs Lab 01/05/16 0400 01/05/16 0414 01/06/16 0410 01/07/16 0210  APTT 43*  --  51* 44*  INR  --  2.80 2.93 2.76    Sepsis Markers  Recent Labs Lab 01/03/16 1905  LATICACIDVEN 1.19    ABG  Recent Labs Lab 01/05/16 0858 01/06/16 1020 01/07/16 0400  PHART 7.300* 7.210* 7.267*  PCO2ART 49.9* 66.4* 59.8*  PO2ART 57.9* 69.9* 74.8*    Liver Enzymes  Recent Labs Lab 01/03/16 1844  01/06/16 0410 01/06/16 1408 01/07/16 0210  AST 56*  --   --   --   --   ALT 40  --   --   --   --   ALKPHOS 127*  --   --   --   --   BILITOT 0.8  --   --   --   --   ALBUMIN 2.1*  < > 2.1* 2.4* 2.3*  < > = values in this interval not displayed.  Cardiac Enzymes  Recent Labs Lab 01/03/16 1844  01/07/16 0156  TROPONINI 0.13* 0.05*    Glucose  Recent Labs Lab 01/06/16 1301 01/06/16 1356 01/06/16 1604 01/06/16 2020 01/07/16 0040 01/07/16 0403  GLUCAP 135* 106* 133* 284* 290* 273*    Imaging Dg Chest Port 1 View  Result Date: 01/06/2016 CLINICAL DATA:  ARDS.  Chronic kidney disease. EXAM: PORTABLE CHEST 1 VIEW COMPARISON:  01/05/2016 and 01/04/2016 FINDINGS: Nasogastric tube, endotracheal tube, left subclavian central venous catheter and right IJ venous catheter unchanged. Lungs are hypoinflated with bilateral patchy mixed interstitial airspace density without significant change. No evidence of effusion. Mild stable cardiomegaly. Remainder of the exam is unchanged. IMPRESSION: Stable diffuse bilateral patchy mixed interstitial airspace process. Tubes and lines unchanged. Electronically Signed   By: Marin Olp M.D.   On: 01/06/2016 10:31    STUDIES:  CTA chest 7/15> No CT evidence for acute pulmonary embolus. Interlobular septal thickening with bilateral ground-glass attenuation. Imaging features are  compatible with pulmonary edema although superimposed infectious/inflammatory etiology is not excluded. Bibasilar collapse/ consolidation. Interval progression of mediastinal and right hilar lymphadenopathy, nonspecific.   CULTURES: BC x 2 7/24 > staph species Urine 7/24 > BAL 7/24 > not sent RVP 7/24 > negative PCP 7/24 > not sent  ANTIBIOTICS: Aztreonam 7/24 >7/25 Meropenem 7/25 >  Levaquin 7/24 > Vancomycin 7/24 > 7/25 Linezolid 7/25 >  Anidulofungin 7/27 >   SIGNIFICANT EVENTS: 7/22 discharge to SNF  LINES/TUBES: ETT 7/24 > L subclavian CVL 7/25 >  R IJ HD cath 7/25 >   DISCUSSION: 75 year old male with baseline RA and COPD and a recent admission for acute respiratory failure with hypoxemia was admitted on 7/24 with ARDS most likely due to HCAP but there has been question of underlying ILD related to his rheumatoid arthritidis.  Has multi-organ failure.  As of 7/28 he is culture negative, oxygenation has improved but he remains critically ill with multi-organ faiulre.  ASSESSMENT / PLAN:   PULMONARY A: Acute hypoxemic respiratory failure  ARDS > improving oxygenation COPD/emphysema HCAP  Mediastinal lymphadenopathy DDx lung process: Most likely HCAP given improvement, baseline autoimmune process? P:   01/07/2016> Change to standard vent protocol from ARDS protocol VAP prevention SBT/WUA when FiO2/PEEP improved CXR in AM Continue low dose solumedrol  CARDIOVASCULAR A:  Abnormal EKG 7.28> ST elevation RCA distribution, resolved spontaneously without hemodynamic change, uncertain etiology, vasospasm? Circulatory Shock> sedation related? improving Chronic diastolic CHF Afib s/p ablation P:  Telemetry  Start ASA Repeat echo if hemodynamics worsen MAP goal > 79mmHg Continue neosynephrine for MAP > 65  RENAL A:   Acute on Chronic kidney disease with worsening metabolic acidosis P:   Appreciate renal Continue CVVHD  GASTROINTESTINAL A:   No acute  issues P:   Continue tube feeding Pepcid for SUP  HEMATOLOGIC A:   Warfarin induced coagulopathy P:  Follow CBC Repeat INR in AM  May need heparin, follow INR  INFECTIOUS A:   HCAP Staph bacteremia> presumably contaminant P:   Antibiotics/antifungals per ID F/U Repeat blood, respiratory cultures, PJP testing from 7/27   ENDOCRINE A:   Chronic prednisone use P:   Continue solumedrol at current dosing CBG monitoring and SSI  NEUROLOGIC A:   Acute metabolic encephalopathy due to hypoxemia and medical sedation P:   RASS goal: -2 Fentanyl infusion Versed infusion > change to prn 01/07/2016   FAMILY  - Updates: Daughter [name] updated 7/28 at length  - Inter-disciplinary family meet or Palliative Care meeting due by: 7/30   CC time  38 minutes   Roselie Awkward, MD [place] [facility] Pager: [phone] Cell: [phone] After 3pm or if no response, call [phone]  01/07/2016 8:59 AM

## 2016-01-07 NOTE — Progress Notes (Signed)
[facility] Progress Note   Subjective: EKG done last night for ST seg changes on monitor, showed new changes c/w possible MI/ Stemi.  Trop was negative x 1, repeat pending. PEEP down to 8, FiO2 no change at 50%, no sig UOP Vitals:   01/07/16 0630 01/07/16 0645 01/07/16 0700 01/07/16 0756  BP: (!) 100/55 (!) 98/53 (!) 99/54 (!) 103/54  Pulse:    72  Resp: (!) 25 (!) 25 (!) 25 (!) 24  Temp: 98.6 F (37 C) 98.6 F (37 C) 98.4 F (36.9 C) 98.2 F (36.8 C)  TempSrc:      SpO2: 96% 96% 97% 98%  Weight:      Height:        Inpatient medications: . anidulafungin  100 mg Intravenous Q24H  . antiseptic oral rinse  7 mL Mouth Rinse QID  . chlorhexidine gluconate (SAGE KIT)  15 mL Mouth Rinse BID  . famotidine (PEPCID) IV  20 mg Intravenous Q24H  . feeding supplement (PRO-STAT SUGAR FREE 64)  30 mL Per Tube Daily  . insulin aspart  0-15 Units Subcutaneous Q4H  . ipratropium-albuterol  3 mL Nebulization Q6H  . levofloxacin (LEVAQUIN) IV  500 mg Intravenous Q24H  . linezolid (ZYVOX) IV  600 mg Intravenous Q12H  . meropenem (MERREM) IV  1 g Intravenous Q12H  . methylPREDNISolone (SOLU-MEDROL) injection  40 mg Intravenous Q12H   . sodium chloride 75 mL/hr at 01/06/16 1900  . feeding supplement (VITAL AF 1.2 CAL) 1,000 mL (01/06/16 1900)  . fentaNYL infusion INTRAVENOUS 100 mcg/hr (01/07/16 0700)  . midazolam (VERSED) infusion 1 mg/hr (01/07/16 0700)  . phenylephrine (NEO-SYNEPHRINE) Adult infusion 40 mcg/min (01/07/16 0700)  . dialysis replacement fluid (prismasate) 400 mL/hr at 01/06/16 1740  . dialysis replacement fluid (prismasate) 200 mL/hr at 01/06/16 1820  . dialysate (PRISMASATE) 2,000 mL/hr at 01/07/16 0000   sodium chloride, sodium chloride, albuterol, alteplase, fentaNYL, heparin, midazolam  Exam: Gen on vent, unresponsive, sedated Sclera anicteric, throat w ETT  No jvd or bruits Chest some post crackles at bases RRR no MRG Abd soft nondistended no  ascites or mass GU normal male w Foley Ext no LE or UE edema Neuro as above  CT chest > underlying emphysema w bilat patchy infiltrates/ ground glass changes, LL > UL's CXR 7.25 > patchy bilat infiltrates UA - negative CVP 10-12  Assessment: 1  Acute on CRF3 - ischemic ATN due to shock most likely, CRRT D#4 2  Recurrent resp failure w pulm infiltrates / low grade fever- unclear pulm process, recurrent PNA vs other (ILI, ARDS, autoimmune), on broad spec IV abx 3  Volume - CVP normal, no gross excess on exam 4  COPD underlying 5  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 6  Abnormal EKG - ST elevation inf-lat leads early this am, resolved on repeat EKG just now 7  ^^WBC - prob due to addition of IV steroids  Plan - cont CRRT , keep even, no hep   Kelly Splinter MD [facility] pager [phone]    cell [phone] 01/07/2016, 8:33 AM    Recent Labs Lab 01/06/16 0410 01/06/16 1408 01/07/16 0210  NA 135  136 136 134*  134*  K 4.5  4.5 4.5 4.5  4.5  CL 104  105 103 103  104  CO2 27  26 27 26  25   GLUCOSE 135*  138* 131* 236*  233*  BUN 26*  25* 20 27*  27*  CREATININE 1.36*  1.34* 1.12 1.42*  1.57*  CALCIUM 8.3*  8.3* 8.3* 8.3*  8.3*  PHOS 2.8 2.2* 3.6    Recent Labs Lab 01/03/16 1844  01/06/16 0410 01/06/16 1408 01/07/16 0210  AST 56*  --   --   --   --   ALT 40  --   --   --   --   ALKPHOS 127*  --   --   --   --   BILITOT 0.8  --   --   --   --   PROT 7.1  --   --   --   --   ALBUMIN 2.1*  < > 2.1* 2.4* 2.3*  < > = values in this interval not displayed.  Recent Labs Lab 01/03/16 1844  01/05/16 0400 01/06/16 0410 01/07/16 0210  WBC 11.6*  < > 18.1* 23.9* 20.9*  NEUTROABS 9.2*  --   --  22.0* 19.5*  HGB 11.4*  < > 10.6* 11.1* 11.4*  HCT 35.8*  < > 33.9* 36.4* 37.5*  MCV 92.5  < > 94.7 96.6 97.9  PLT 301  < > 164 155 149*  < > = values in this interval not displayed. Iron/TIBC/Ferritin/ %Sat    Component  Value Date/Time   IRON 24 (L) 08/13/2015 0430   TIBC 258 08/13/2015 0430   FERRITIN 147 08/13/2015 0430   IRONPCTSAT 9 (L) 08/13/2015 0430

## 2016-01-07 NOTE — Progress Notes (Signed)
CRITICAL VALUE ALERT  Critical value received:  PCO2 - 59.8  Date of notification:  01/07/2016  Time of notification:  [id number]  Critical value read back:Yes.    Nurse who received alert:  Macario Golds  MD notified (1st page):    Time of first page:    MD notified (2nd page):  Time of second page:  Responding MD:  Dr. Jimmy Footman  Time MD responded:  [phone]

## 2016-01-07 NOTE — Progress Notes (Signed)
ST Changes noted at 0130 on 01/07/2016  Approximately 15-20 minutes after bath given noticed ST changes on the EKG monitor.  12-Lead EKG done with a computer diagnosis of STEMI.  Pt however does have a bundle branch block.  Dr. Jimmy Footman at [facility] notified of ST changes and 12-lead EKG showing STEMI diagnosis was faxed to [facility].  Pt is sedated on Fentanyl gtt @ 45mcg/hr and Versed gtt @ 1mg /hr.  No signs of pain, except HR slightly elevated.  Fentanyl gtt increased to 151mcg/hr.  Also had been trying to titrate down on the Neo gtt, which was at 32mcg/min, BP resulted as 89/57 with MAP of 66, due to increasing the Fentanyl gtt, Neo gtt was also increased to 87mcg/min. Dr. Jimmy Footman gave orders for Troponion level.  No there orders given at this time.  I will continue to assess for any changes.  Francia Greaves Raven [place]as,RN,BSN,CCRN

## 2016-01-07 NOTE — Progress Notes (Signed)
CRITICAL VALUE ALERT  Critical value received:  Trop - 0.05  Date of notification:  01/07/16  Time of notification:  [id number]  Critical value read back:Yes.    Nurse who received alert:  Macario Golds  MD notified (1st page):   Time of first page:    MD notified (2nd page):  Time of second page:  Responding MD:  Dr. Jimmy Footman  Time MD responded:  [phone]

## 2016-01-07 NOTE — Progress Notes (Signed)
Inpatient Diabetes Program Recommendations  AACE/ADA: New Consensus Statement on Inpatient Glycemic Control (2015)  Target Ranges:  Prepandial:   less than 140 mg/dL      Peak postprandial:   less than 180 mg/dL (1-2 hours)      Critically ill patients:  140 - 180 mg/dL   Results for RC, SCHIRALDI (MRN [id number]) as of 01/07/2016 08:15  Ref. Range 01/06/2016 16:04 01/06/2016 20:20 01/07/2016 00:40 01/07/2016 04:03  Glucose-Capillary Latest Ref Range: 65 - 99 mg/dL 133 (H) 284 (H) 290 (H) 273 (H)   Admit with: Respiratory Failure  History: DM, COPD, CHF, CKD  SNF DM Meds: Levemir 15 units daily                                              Novolog SSI  Current Insulin Orders: Novolog Moderate Correction Scale/ SSI (0-15 units) Q4 hours    -Note patient remains Intubated.  Receiving continuous Tube Feedings at 55cc/hour.  -Patient also receiving IV Solumedrol 40 mg bid.  -Glucose levels 250 mg/dl and higher range since 8pm last night.  -Unsure why ICU Glycemic Control Protocol stopped as patient remains intubated and in the ICU setting?    MD- Please consider the following in-hospital insulin adjustments:  Restart ICU Glycemic Control Protocol  OR  Start Levemir 15 units daily (patient's home dose) and  Start Novolog Tube Feed coverage: Novolog 4 units Q4 hours (hold if tube feeds held for any reason)      --Will follow patient during hospitalization--  Wyn Quaker RN, MSN, CDE Diabetes Coordinator Inpatient Glycemic Control Team Team Pager: [phone] (8a-5p)

## 2016-01-07 NOTE — Progress Notes (Signed)
INFECTIOUS DISEASE PROGRESS NOTE  ID: Curtis Frazier is a 75 y.o. male with  Active Problems:   Acute respiratory failure with hypoxemia ([place])   Visit for intubation   ARDS (adult respiratory distress syndrome) (HCC)   Malnutrition of moderate degree   abtx say #5: linezolid #4/meropenem #4/anidula #2 Subjective: The patient remains afebrile, vent setting mildlyl improved FIO2 40% PEEP 5. Still anuric on CVVH, and vasopressors close to weaning off. No BM in the last 2 days. He had episode of arrythmia, spontaneously resolved last night.  Abtx:  Anti-infectives    Start     Dose/Rate Route Frequency Ordered Stop   01/07/16 1700  anidulafungin (ERAXIS) 100 mg in sodium chloride 0.9 % 100 mL IVPB     100 mg over 90 Minutes Intravenous Every 24 hours 01/06/16 1537     01/06/16 1630  anidulafungin (ERAXIS) 200 mg in sodium chloride 0.9 % 200 mL IVPB     200 mg over 180 Minutes Intravenous  Once 01/06/16 1535 01/06/16 1954   01/05/16 2200  meropenem (MERREM) 1 g in sodium chloride 0.9 % 100 mL IVPB     1 g 200 mL/hr over 30 Minutes Intravenous Every 12 hours 01/05/16 1023     01/05/16 1030  meropenem (MERREM) 500 mg in sodium chloride 0.9 % 50 mL IVPB     500 mg 100 mL/hr over 30 Minutes Intravenous STAT 01/05/16 1023 01/05/16 1125   01/05/16 0100  meropenem (MERREM) 500 mg in sodium chloride 0.9 % 50 mL IVPB  Status:  Discontinued     500 mg 100 mL/hr over 30 Minutes Intravenous Every 8 hours 01/04/16 1620 01/05/16 1023   01/04/16 2200  linezolid (ZYVOX) IVPB 600 mg     600 mg 300 mL/hr over 60 Minutes Intravenous Every 12 hours 01/04/16 1555     01/04/16 2000  vancomycin (VANCOCIN) IVPB 1000 mg/200 mL premix  Status:  Discontinued     1,000 mg 200 mL/hr over 60 Minutes Intravenous Every 24 hours 01/03/16 2016 01/04/16 0730   01/04/16 2000  vancomycin (VANCOCIN) IVPB 750 mg/150 ml premix  Status:  Discontinued     750 mg 150 mL/hr over 60 Minutes Intravenous Every 24 hours  01/04/16 0730 01/04/16 1555   01/04/16 1800  imipenem-cilastatin (PRIMAXIN) 500 mg in sodium chloride 0.9 % 100 mL IVPB  Status:  Discontinued     500 mg 200 mL/hr over 30 Minutes Intravenous Every 12 hours 01/04/16 0730 01/04/16 1555   01/04/16 1700  levofloxacin (LEVAQUIN) IVPB 500 mg     500 mg 100 mL/hr over 60 Minutes Intravenous Every 24 hours 01/04/16 1555     01/04/16 1700  meropenem (MERREM) 1 g in sodium chloride 0.9 % 100 mL IVPB     1 g 200 mL/hr over 30 Minutes Intravenous  Once 01/04/16 1619 01/04/16 1712   01/04/16 0400  imipenem-cilastatin (PRIMAXIN) 500 mg in sodium chloride 0.9 % 100 mL IVPB  Status:  Discontinued     500 mg 200 mL/hr over 30 Minutes Intravenous Every 8 hours 01/03/16 2016 01/04/16 0730   01/03/16 2000  imipenem-cilastatin (PRIMAXIN) 500 mg in sodium chloride 0.9 % 100 mL IVPB     500 mg 200 mL/hr over 30 Minutes Intravenous STAT 01/03/16 1953 01/03/16 2114   01/03/16 1830  vancomycin (VANCOCIN) IVPB 1000 mg/200 mL premix     1,000 mg 200 mL/hr over 60 Minutes Intravenous  Once 01/03/16 1819 01/03/16 2043  Medications:  Scheduled: . anidulafungin  100 mg Intravenous Q24H  . antiseptic oral rinse  7 mL Mouth Rinse QID  . aspirin  81 mg Oral Daily  . chlorhexidine gluconate (SAGE KIT)  15 mL Mouth Rinse BID  . famotidine (PEPCID) IV  20 mg Intravenous Q24H  . insulin aspart  0-15 Units Subcutaneous Q4H  . ipratropium-albuterol  3 mL Nebulization Q6H  . levofloxacin (LEVAQUIN) IV  500 mg Intravenous Q24H  . linezolid (ZYVOX) IV  600 mg Intravenous Q12H  . meropenem (MERREM) IV  1 g Intravenous Q12H  . methylPREDNISolone (SOLU-MEDROL) injection  40 mg Intravenous Q12H    Objective: Vital signs in last 24 hours: Temp:  [97 F (36.1 C)-99.1 F (37.3 C)] 99.1 F (37.3 C) (07/28 1430) Pulse Rate:  [72-82] 82 (07/28 1155) Resp:  [12-27] 20 (07/28 1430) BP: (87-132)/(39-67) 100/54 (07/28 1430) SpO2:  [89 %-99 %] 93 % (07/28 1430) FiO2 (%):   [40 %-50 %] 40 % (07/28 1155) Weight:  [158 lb 11.7 oz (72 kg)] 158 lb 11.7 oz (72 kg) (07/28 0500) Physical Exam  Constitutional: He is intubated/sedated. He appears well-developed and well-nourished. No distress.  HENT: OETT in place Cardiovascular: Normal rate, regular rhythm and normal heart sounds. Exam reveals no gallop and no friction rub.  No murmur heard.  Pulmonary/Chest: Effort normal and breath sounds normal. No respiratory distress. He has no wheezes.  Abdominal: distended. Decreased bowel sounds Skin: Skin is warm and dry. No rash noted. No erythema.  Neuro = spontaneously moving upper extremities   Lab Results  Recent Labs  01/06/16 0410 01/06/16 1408 01/07/16 0210  WBC 23.9*  --  20.9*  HGB 11.1*  --  11.4*  HCT 36.4*  --  37.5*  NA 135  136 136 134*  134*  K 4.5  4.5 4.5 4.5  4.5  CL 104  105 103 103  104  CO2 _0 BUN 26*  25* 20 27*  27*  CREATININE 1.36*  1.34* 1.12 1.42*  1.57*   Liver Panel  Recent Labs  01/06/16 1408 01/07/16 0210  ALBUMIN 2.4* 2.3*   Microbiology: Trach Cx "resp flora" (unfortunately his BAL did not make it to lab) BCx 1/2 CNS (contaminant), UCx negative. Resp virus panel (-) ur leg (-) CXR unchanged Recent Results (from the past 240 hour(s))  MRSA PCR Screening     Status: None   Collection Time: 01/03/16  5:54 PM  Result Value Ref Range Status   MRSA by PCR NEGATIVE NEGATIVE Final    Comment:        The GeneXpert MRSA Assay (FDA approved for NASAL specimens only), is one component of a comprehensive MRSA colonization surveillance program. It is not intended to diagnose MRSA infection nor to guide or monitor treatment for MRSA infections.   Blood Culture (routine x 2)     Status: None (Preliminary result)   Collection Time: 01/03/16  6:44 PM  Result Value Ref Range Status   Specimen Description BLOOD LEFT ANTECUBITAL  Final   Special Requests BOTTLES DRAWN AEROBIC AND ANAEROBIC 5CC  Final     Culture   Final    NO GROWTH 3 DAYS Performed at [facility]    Report Status PENDING  Incomplete  Blood Culture (routine x 2)     Status: Abnormal   Collection Time: 01/03/16  6:50 PM  Result Value Ref Range Status   Specimen Description BLOOD LEFT HAND  Final   Special Requests BOTTLES DRAWN AEROBIC AND ANAEROBIC 5CC  Final   Culture  Setup Time   Final    GRAM POSITIVE COCCI IN CLUSTERS AEROBIC BOTTLE ONLY CRITICAL RESULT CALLED TO, READ BACK BY AND VERIFIED WITH: E. JACKSON, [facility] PHARMD AT [place] ON 01/04/16 BY C. JESSUP, MLT.    Culture (A)  Final    STAPHYLOCOCCUS SPECIES (COAGULASE NEGATIVE) THE SIGNIFICANCE OF ISOLATING THIS ORGANISM FROM A SINGLE SET OF BLOOD CULTURES WHEN MULTIPLE SETS ARE DRAWN IS UNCERTAIN. PLEASE NOTIFY THE MICROBIOLOGY DEPARTMENT WITHIN ONE WEEK IF SPECIATION AND SENSITIVITIES ARE REQUIRED. Performed at [facility]    Report Status 01/05/2016 FINAL  Final  Blood Culture ID Panel (Reflexed)     Status: Abnormal   Collection Time: 01/03/16  6:50 PM  Result Value Ref Range Status   Enterococcus species NOT DETECTED NOT DETECTED Final   Vancomycin resistance NOT DETECTED NOT DETECTED Final   Listeria monocytogenes NOT DETECTED NOT DETECTED Final   Staphylococcus species DETECTED (A) NOT DETECTED Final    Comment: CRITICAL RESULT CALLED TO, READ BACK BY AND VERIFIED WITH: [place], [facility] PHARMD AT 1840 ON 01/04/16 BY C. JESSUP, MLT.    Staphylococcus aureus NOT DETECTED NOT DETECTED Final   Methicillin resistance NOT DETECTED NOT DETECTED Final   Streptococcus species NOT DETECTED NOT DETECTED Final   Streptococcus agalactiae NOT DETECTED NOT DETECTED Final   Streptococcus pneumoniae NOT DETECTED NOT DETECTED Final   Streptococcus pyogenes NOT DETECTED NOT DETECTED Final   Acinetobacter baumannii NOT DETECTED NOT DETECTED Final   Enterobacteriaceae species NOT DETECTED NOT DETECTED Final   Enterobacter cloacae complex NOT DETECTED NOT  DETECTED Final   Escherichia coli NOT DETECTED NOT DETECTED Final   Klebsiella oxytoca NOT DETECTED NOT DETECTED Final   Klebsiella pneumoniae NOT DETECTED NOT DETECTED Final   Proteus species NOT DETECTED NOT DETECTED Final   Serratia marcescens NOT DETECTED NOT DETECTED Final   Carbapenem resistance NOT DETECTED NOT DETECTED Final   Haemophilus influenzae NOT DETECTED NOT DETECTED Final   Neisseria meningitidis NOT DETECTED NOT DETECTED Final   Pseudomonas aeruginosa NOT DETECTED NOT DETECTED Final   Candida albicans NOT DETECTED NOT DETECTED Final   Candida glabrata NOT DETECTED NOT DETECTED Final   Candida krusei NOT DETECTED NOT DETECTED Final   Candida parapsilosis NOT DETECTED NOT DETECTED Final   Candida tropicalis NOT DETECTED NOT DETECTED Final    Comment: Performed at [facility]  Culture, respiratory (NON-Expectorated)     Status: None   Collection Time: 01/03/16  7:52 PM  Result Value Ref Range Status   Specimen Description THROAT  Final   Special Requests NONE  Final   Gram Stain   Final    ABUNDANT WBC PRESENT,BOTH PMN AND MONONUCLEAR RARE GRAM POSITIVE COCCI IN PAIRS RARE GRAM VARIABLE ROD RARE YEAST    Culture   Final    Consistent with normal respiratory flora. Performed at [facility]    Report Status 01/06/2016 FINAL  Final  Culture, blood (routine x 2)     Status: None (Preliminary result)   Collection Time: 01/03/16  8:45 PM  Result Value Ref Range Status   Specimen Description BLOOD RIGHT ANTECUBITAL  Final   Special Requests BOTTLES DRAWN AEROBIC AND ANAEROBIC 5CC  Final   Culture   Final    NO GROWTH 3 DAYS Performed at [facility]    Report Status PENDING  Incomplete  Culture, blood (routine x 2)  Status: None (Preliminary result)   Collection Time: 01/03/16  8:49 PM  Result Value Ref Range Status   Specimen Description BLOOD RIGHT HAND  Final   Special Requests BOTTLES DRAWN AEROBIC ONLY 5CC  Final   Culture   Final      NO GROWTH 3 DAYS Performed at [facility]    Report Status PENDING  Incomplete  Urine culture     Status: None   Collection Time: 01/03/16 11:15 PM  Result Value Ref Range Status   Specimen Description URINE, CATHETERIZED  Final   Special Requests NONE  Final   Culture NO GROWTH Performed at [facility]   Final   Report Status 01/05/2016 FINAL  Final  Respiratory Panel by PCR     Status: None   Collection Time: 01/03/16 11:20 PM  Result Value Ref Range Status   Adenovirus NOT DETECTED NOT DETECTED Final   Coronavirus 229E NOT DETECTED NOT DETECTED Final   Coronavirus HKU1 NOT DETECTED NOT DETECTED Final   Coronavirus NL63 NOT DETECTED NOT DETECTED Final   Coronavirus OC43 NOT DETECTED NOT DETECTED Final   Metapneumovirus NOT DETECTED NOT DETECTED Final   Rhinovirus / Enterovirus NOT DETECTED NOT DETECTED Final   Influenza A NOT DETECTED NOT DETECTED Final   Influenza A H1 NOT DETECTED NOT DETECTED Final   Influenza A H1 2009 NOT DETECTED NOT DETECTED Final   Influenza A H3 NOT DETECTED NOT DETECTED Final   Influenza B NOT DETECTED NOT DETECTED Final   Parainfluenza Virus 1 NOT DETECTED NOT DETECTED Final   Parainfluenza Virus 2 NOT DETECTED NOT DETECTED Final   Parainfluenza Virus 3 NOT DETECTED NOT DETECTED Final   Parainfluenza Virus 4 NOT DETECTED NOT DETECTED Final   Respiratory Syncytial Virus NOT DETECTED NOT DETECTED Final   Bordetella pertussis NOT DETECTED NOT DETECTED Final   Chlamydophila pneumoniae NOT DETECTED NOT DETECTED Final   Mycoplasma pneumoniae NOT DETECTED NOT DETECTED Final    Comment: Performed at [facility]  Culture, respiratory (NON-Expectorated)     Status: None (Preliminary result)   Collection Time: 01/06/16  9:45 AM  Result Value Ref Range Status   Specimen Description TRACHEAL ASPIRATE  Final   Special Requests Normal  Final   Gram Stain   Final    NO WBC SEEN RARE GRAM POSITIVE COCCI IN PAIRS AND CHAINS RARE  YEAST RARE SQUAMOUS EPITHELIAL CELLS PRESENT    Culture   Final    CULTURE REINCUBATED FOR BETTER GROWTH Performed at [facility]    Report Status PENDING  Incomplete  Pneumocystis smear by DFA     Status: None   Collection Time: 01/06/16 10:04 AM  Result Value Ref Range Status   Specimen Source-PJSRC TRACHEAL ASPIRATE  Final   Pneumocystis jiroveci Ag NEGATIVE  Final    Comment: Performed at [name]    Studies/Results: Dg Chest Port 1 View  Result Date: 01/06/2016 CLINICAL DATA:  ARDS.  Chronic kidney disease. EXAM: PORTABLE CHEST 1 VIEW COMPARISON:  01/05/2016 and 01/04/2016 FINDINGS: Nasogastric tube, endotracheal tube, left subclavian central venous catheter and right IJ venous catheter unchanged. Lungs are hypoinflated with bilateral patchy mixed interstitial airspace density without significant change. No evidence of effusion. Mild stable cardiomegaly. Remainder of the exam is unchanged. IMPRESSION: Stable diffuse bilateral patchy mixed interstitial airspace process. Tubes and lines unchanged. Electronically Signed   By: Marin Olp M.D.   On: 01/06/2016 10:31    Assessment/Plan: HCAP  Clinical pictures suggestive of ARDS Continue with broad coverage with linezolid, meroepnem ,and anidulafungin due to yeast on trach aspirate and no clinical improvements while on antibacterials. Possibly multifactorial cause of respiratory distress, including auto-immune process with ILD    Leukocytosis = Steroids causing increased WBC. Continue to monitor  Acute on CKD = continue on CVVH   Constipation = continue to monitor with serial abdominal exam.           Nakiya Rallis Infectious Diseases (pager) [phone] [email or web address] 01/07/2016, 2:41 PM  LOS: 4 days

## 2016-01-08 ENCOUNTER — Inpatient Hospital Stay (HOSPITAL_COMMUNITY): Payer: Medicare Other

## 2016-01-08 DIAGNOSIS — D689 Coagulation defect, unspecified: Secondary | ICD-10-CM

## 2016-01-08 DIAGNOSIS — R739 Hyperglycemia, unspecified: Secondary | ICD-10-CM

## 2016-01-08 LAB — RENAL FUNCTION PANEL
ALBUMIN: 2.4 g/dL — AB (ref 3.5–5.0)
ANION GAP: 7 (ref 5–15)
Albumin: 2.3 g/dL — ABNORMAL LOW (ref 3.5–5.0)
Anion gap: 5 (ref 5–15)
BUN: 29 mg/dL — ABNORMAL HIGH (ref 6–20)
BUN: 29 mg/dL — AB (ref 6–20)
CHLORIDE: 102 mmol/L (ref 101–111)
CHLORIDE: 101 mmol/L (ref 101–111)
CO2: 26 mmol/L (ref 22–32)
CO2: 27 mmol/L (ref 22–32)
CREATININE: 1.54 mg/dL — AB (ref 0.61–1.24)
Calcium: 8.2 mg/dL — ABNORMAL LOW (ref 8.9–10.3)
Calcium: 8.2 mg/dL — ABNORMAL LOW (ref 8.9–10.3)
Creatinine, Ser: 1.58 mg/dL — ABNORMAL HIGH (ref 0.61–1.24)
GFR calc Af Amer: 49 mL/min — ABNORMAL LOW (ref >60)
GFR calc non Af Amer: 42 mL/min — ABNORMAL LOW (ref >60)
GFR, EST AFRICAN AMERICAN: 47 mL/min — AB (ref >60)
GFR, EST NON AFRICAN AMERICAN: 41 mL/min — AB (ref >60)
GLUCOSE: 233 mg/dL — AB (ref 65–99)
Glucose, Bld: 231 mg/dL — ABNORMAL HIGH (ref 65–99)
PHOSPHORUS: 3.6 mg/dL (ref 2.5–4.6)
POTASSIUM: 4.9 mmol/L (ref 3.5–5.1)
Phosphorus: 3.5 mg/dL (ref 2.5–4.6)
Potassium: 4.6 mmol/L (ref 3.5–5.1)
Sodium: 135 mmol/L (ref 135–145)
Sodium: 133 mmol/L — ABNORMAL LOW (ref 135–145)

## 2016-01-08 LAB — BLOOD GAS, ARTERIAL
Acid-base deficit: 1.9 mmol/L (ref 0.0–2.0)
Bicarbonate: 25.7 mEq/L — ABNORMAL HIGH (ref 20.0–24.0)
DRAWN BY: 252031
FIO2: 0.4
MECHVT: 480 mL
O2 SAT: 92.5 %
PEEP/CPAP: 5 cmH2O
PH ART: 7.249 — AB (ref 7.350–7.450)
PO2 ART: 71.7 mmHg — AB (ref 80.0–100.0)
Patient temperature: 98.6
RATE: 14 resp/min
TCO2: 24.4 mmol/L (ref 0–100)
pCO2 arterial: 61 mmHg (ref 35.0–45.0)

## 2016-01-08 LAB — CBC WITH DIFFERENTIAL/PLATELET
Basophils Absolute: 0 10*3/uL (ref 0.0–0.1)
Basophils Relative: 0 %
EOS ABS: 0 10*3/uL (ref 0.0–0.7)
EOS PCT: 0 %
HCT: 37.4 % — ABNORMAL LOW (ref 39.0–52.0)
Hemoglobin: 11.1 g/dL — ABNORMAL LOW (ref 13.0–17.0)
LYMPHS ABS: 0.2 10*3/uL — AB (ref 0.7–4.0)
Lymphocytes Relative: 1 %
MCH: 29.1 pg (ref 26.0–34.0)
MCHC: 29.7 g/dL — AB (ref 30.0–36.0)
MCV: 98.2 fL (ref 78.0–100.0)
Monocytes Absolute: 1.4 10*3/uL — ABNORMAL HIGH (ref 0.1–1.0)
Monocytes Relative: 7 %
NEUTROS PCT: 92 %
Neutro Abs: 18.5 10*3/uL — ABNORMAL HIGH (ref 1.7–7.7)
PLATELETS: 170 10*3/uL (ref 150–400)
RBC: 3.81 MIL/uL — ABNORMAL LOW (ref 4.22–5.81)
RDW: 18.1 % — AB (ref 11.5–15.5)
WBC: 20.1 10*3/uL — AB (ref 4.0–10.5)

## 2016-01-08 LAB — GLUCOSE, CAPILLARY
GLUCOSE-CAPILLARY: 240 mg/dL — AB (ref 65–99)
Glucose-Capillary: 197 mg/dL — ABNORMAL HIGH (ref 65–99)
Glucose-Capillary: 209 mg/dL — ABNORMAL HIGH (ref 65–99)
Glucose-Capillary: 213 mg/dL — ABNORMAL HIGH (ref 65–99)
Glucose-Capillary: 241 mg/dL — ABNORMAL HIGH (ref 65–99)
Glucose-Capillary: 264 mg/dL — ABNORMAL HIGH (ref 65–99)
Glucose-Capillary: 310 mg/dL — ABNORMAL HIGH (ref 65–99)

## 2016-01-08 LAB — PROTIME-INR
INR: 2.84
Prothrombin Time: 30.4 seconds — ABNORMAL HIGH (ref 11.4–15.2)

## 2016-01-08 LAB — CULTURE, BLOOD (ROUTINE X 2)
CULTURE: NO GROWTH
CULTURE: NO GROWTH
Culture: NO GROWTH

## 2016-01-08 LAB — CULTURE, RESPIRATORY
CULTURE: NORMAL
GRAM STAIN: NONE SEEN
SPECIAL REQUESTS: NORMAL

## 2016-01-08 LAB — MAGNESIUM: MAGNESIUM: 2.4 mg/dL (ref 1.7–2.4)

## 2016-01-08 LAB — CULTURE, RESPIRATORY W GRAM STAIN

## 2016-01-08 LAB — APTT: APTT: 49 s — AB (ref 24–36)

## 2016-01-08 MED ORDER — NYSTATIN 100000 UNIT/GM EX POWD
Freq: Three times a day (TID) | CUTANEOUS | Status: DC
  Administered 2016-01-08 – 2016-01-19 (×34): via TOPICAL
  Filled 2016-01-08: qty 15

## 2016-01-08 MED ORDER — MIDAZOLAM HCL 2 MG/2ML IJ SOLN
1.0000 mg | INTRAMUSCULAR | Status: DC | PRN
  Administered 2016-01-08 – 2016-01-09 (×12): 2 mg via INTRAVENOUS
  Administered 2016-01-10: 1 mg via INTRAVENOUS
  Administered 2016-01-10 (×2): 2 mg via INTRAVENOUS
  Administered 2016-01-10: 1 mg via INTRAVENOUS
  Administered 2016-01-10 – 2016-01-11 (×5): 2 mg via INTRAVENOUS
  Administered 2016-01-11: 1 mg via INTRAVENOUS
  Administered 2016-01-11 – 2016-01-12 (×5): 2 mg via INTRAVENOUS
  Administered 2016-01-12: 1 mg via INTRAVENOUS
  Administered 2016-01-12 – 2016-01-13 (×2): 2 mg via INTRAVENOUS
  Administered 2016-01-14: 1 mg via INTRAVENOUS
  Filled 2016-01-08 (×12): qty 2
  Filled 2016-01-08: qty 4
  Filled 2016-01-08 (×15): qty 2
  Filled 2016-01-08: qty 4
  Filled 2016-01-08 (×2): qty 2

## 2016-01-08 MED ORDER — NYSTATIN 100000 UNIT/ML MT SUSP
5.0000 mL | Freq: Three times a day (TID) | OROMUCOSAL | Status: DC
  Administered 2016-01-08 – 2016-01-19 (×32): 500000 [IU] via ORAL
  Filled 2016-01-08 (×32): qty 5

## 2016-01-08 MED ORDER — MIDAZOLAM HCL 2 MG/2ML IJ SOLN
INTRAMUSCULAR | Status: AC
  Administered 2016-01-08: 2 mg via INTRAVENOUS
  Filled 2016-01-08: qty 2

## 2016-01-08 MED ORDER — SENNOSIDES 8.8 MG/5ML PO SYRP
5.0000 mL | ORAL_SOLUTION | Freq: Two times a day (BID) | ORAL | Status: DC
  Administered 2016-01-08 – 2016-01-16 (×15): 5 mL
  Filled 2016-01-08 (×18): qty 5

## 2016-01-08 MED ORDER — INSULIN GLARGINE 100 UNIT/ML ~~LOC~~ SOLN
20.0000 [IU] | SUBCUTANEOUS | Status: DC
  Administered 2016-01-08: 20 [IU] via SUBCUTANEOUS
  Filled 2016-01-08 (×2): qty 0.2

## 2016-01-08 NOTE — Progress Notes (Signed)
Pharmacy Antibiotic Note  Curtis Frazier is a 75 y.o. male with PMHx CAD s/p stent, PAF on warfarin s/p recent ablation, CKD-III, DM, diastolic CHF, and RA on chronic prednisone with recent hospitalization for PNA and discharged 7/22,  presents 7/24 from SNF for AMS.  Bronch performed showing diffuse, thick white creamy pus throughout.  CT shows interlobular septal thickening with bilateral ground-glass attenuation with features compatible with pulmonary edema although superimposed infectious / inflammatory etiology not excluded. Pharmacy was intitially consulted for Vanc and Primaxin dosing, but ID consulted on 7/25 and Vanc & Primaxin have been D/C'ed.  Zyvox and Levofloxacin to begin and Pharmacy consulted to dose Meropenem.    CRRT was initiated on 7/25  (estimate CrCl ~ 25 -30 ml/min) D6 Abx, D3 Eraxis -WBC remains elevated likely 2/2 steroids  Plan:  Zyvox 600mg  IV q12h per MD  Levofloxacin 500mg  IV q24h per MD  Meropenem 1gm IV q12h  Eraxis 100mg  IV q24h  F/u renal function, cultures, clinical course  Height: 5\' 8"  (172.7 cm) Weight: 158 lb 8.2 oz (71.9 kg) IBW/kg (Calculated) : 68.4  Temp (24hrs), Avg:98.7 F (37.1 C), Min:97.7 F (36.5 C), Max:99.3 F (37.4 C)   Recent Labs Lab 01/03/16 1905 01/04/16 0307  01/05/16 0400  01/06/16 0410 01/06/16 1408 01/07/16 0210 01/07/16 1540 01/08/16 0430  WBC  --  15.1*  --  18.1*  --  23.9*  --  20.9*  --  20.1*  CREATININE  --  2.33*  < > 1.60*  < > 1.36*  1.34* 1.12 1.42*  1.57* 1.63* 1.58*  LATICACIDVEN 1.19  --   --   --   --   --   --   --   --   --   < > = values in this interval not displayed.  Estimated Creatinine Clearance: 38.5 mL/min (by C-G formula based on SCr of 1.58 mg/dL).    Allergies  Allergen Reactions  . Ace Inhibitors Other (See Comments)    Causes high K   . Angiotensin Receptor Blockers Other (See Comments)    Causes high K   . Metoprolol Other (See Comments)    May cause elevated K level   .  Penicillins Swelling and Rash    Has patient had a PCN reaction causing immediate rash, facial/tongue/throat swelling, SOB or lightheadedness with hypotension: YES Has patient had a PCN reaction causing severe rash involving mucus membranes or skin necrosis: NO Has patient had a PCN reaction that required hospitalization NO Has patient had a PCN reaction occurring within the last 10 years: NO If all of the above answers are "NO", then may proceed with Cephalosporin use. Pt has tolerated cephalosporins in the past   . Amiodarone Other (See Comments)    12/27/15: Cardiologist discontinued the amiodarone (on PTA) due to  concern for toxicity  (in hospitial with acute hypoxemic respiratory failure)   . Tetracycline Swelling and Rash    Antimicrobials this admission: 7/24 Vancomycin >> 7/25 7/24 Primaxin >> 7/25 7/25 Meropenem >> 7/25 Zyvox >>  7/25 Levofloxacin >> 7/26 Anidulafungin >>  Dose adjustments this admission: Merrem adjusted for CRRT  Microbiology results: 7/24 BCx: 1/2 Staph species, 1/2 NG 7/24 BCID: Staphylococcus species DETECTED  7/24 BAL: rare GPC pairs, rare gram variable, rare yeast - consistent with normal flora 7/24 UCx: NGF 7/24 BCx: NGTD 7/24 MRSA PCR: negative 7/24 Resp panel: none detected 7/27 Pneumocystis smear (BAL): negative 7/27 Resp cx: pending, (gram stain: Rare yeast, rare GPC in  pairs, chains) 7/27 BCx: NGTD  Thank you for allowing pharmacy to be a part of this patient's care.  Ralene Bathe, PharmD, BCPS 01/08/2016, 7:34 AM  Pager: [phone]

## 2016-01-08 NOTE — Progress Notes (Signed)
[facility] Progress Note   Subjective: no sig UOP, fio2 down 40%, peep down 5, neo gtt down 14 ug/min, tmax 99.3   Vitals:   01/08/16 0700 01/08/16 0730 01/08/16 0800 01/08/16 0830  BP: (!) 105/53 (!) 104/53 121/61 127/62  Pulse:   96   Resp: _0 Temp: 99.5 F (37.5 C) 99.3 F (37.4 C) 99.3 F (37.4 C) 99.3 F (37.4 C)  TempSrc:   Core (Comment)   SpO2: 91% 92% 92% 92%  Weight:      Height:        Inpatient medications: . anidulafungin  100 mg Intravenous Q24H  . antiseptic oral rinse  7 mL Mouth Rinse QID  . aspirin  81 mg Oral Daily  . chlorhexidine gluconate (SAGE KIT)  15 mL Mouth Rinse BID  . famotidine (PEPCID) IV  20 mg Intravenous Q24H  . insulin aspart  0-15 Units Subcutaneous Q4H  . ipratropium-albuterol  3 mL Nebulization Q6H  . levofloxacin (LEVAQUIN) IV  500 mg Intravenous Q24H  . linezolid (ZYVOX) IV  600 mg Intravenous Q12H  . meropenem (MERREM) IV  1 g Intravenous Q12H  . methylPREDNISolone (SOLU-MEDROL) injection  40 mg Intravenous Q12H   . sodium chloride 75 mL/hr at 01/08/16 0800  . feeding supplement (VITAL AF 1.2 CAL) 1,000 mL (01/08/16 0800)  . fentaNYL infusion INTRAVENOUS 100 mcg/hr (01/08/16 0828)  . phenylephrine (NEO-SYNEPHRINE) Adult infusion 14.933 mcg/min (01/08/16 0800)  . dialysis replacement fluid (prismasate) 400 mL/hr at 01/08/16 0536  . dialysis replacement fluid (prismasate) 200 mL/hr at 01/07/16 1855  . dialysate (PRISMASATE) 2,000 mL/hr at 01/08/16 9767   sodium chloride, sodium chloride, albuterol, alteplase, fentaNYL, heparin, ipratropium, midazolam  Exam: Gen on vent, unresponsive, sedated Sclera anicteric, throat w ETT  No jvd or bruits Chest some post crackles at bases RRR no MRG Abd soft nondistended no ascites or mass GU normal male w Foley Ext no LE or UE edema Neuro as above  CT chest > underlying emphysema w bilat patchy infiltrates/ ground glass changes, LL > UL's CXR 7.25 > patchy  bilat infiltrates UA - negative CVP 10-12  Assessment: 1  Acute on CRF3 - ischemic ATN due to shock most likely, CRRT D#4 2  Recurrent resp failure - ARDS + prob HCAP 3  Volume - CVP 12, stable wts, no vol excess 4  COPD underlying 5  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 for years (methotrexate, Remicade/ Humira, pred)  Plan - cont CRRT , keep even, no hep   Kelly Splinter MD [facility] pager [phone]    cell [phone] 01/08/2016, 8:38 AM    Recent Labs Lab 01/07/16 0210 01/07/16 1540 01/08/16 0430  NA 134*  134* 134* 135  K 4.5  4.5 4.7 4.9  CL 103  104 102 102  CO2 _1 GLUCOSE 236*  233* 252* 231*  BUN 27*  27* 27* 29*  CREATININE 1.42*  1.57* 1.63* 1.58*  CALCIUM 8.3*  8.3* 8.1* 8.2*  PHOS 3.6 3.1 3.6    Recent Labs Lab 01/03/16 1844  01/07/16 0210 01/07/16 1540 01/08/16 0430  AST 56*  --   --   --   --   ALT 40  --   --   --   --   ALKPHOS 127*  --   --   --   --   BILITOT 0.8  --   --   --   --  PROT 7.1  --   --   --   --   ALBUMIN 2.1*  < > 2.3* 2.2* 2.4*  < > = values in this interval not displayed.  Recent Labs Lab 01/06/16 0410 01/07/16 0210 01/08/16 0430  WBC 23.9* 20.9* 20.1*  NEUTROABS 22.0* 19.5* 18.5*  HGB 11.1* 11.4* 11.1*  HCT 36.4* 37.5* 37.4*  MCV 96.6 97.9 98.2  PLT 155 149* 170   Iron/TIBC/Ferritin/ %Sat    Component Value Date/Time   IRON 24 (L) 08/13/2015 0430   TIBC 258 08/13/2015 0430   FERRITIN 147 08/13/2015 0430   IRONPCTSAT 9 (L) 08/13/2015 0430

## 2016-01-08 NOTE — Progress Notes (Signed)
CRITICAL VALUE ALERT  Critical value received:  ABG pH of 7.249  Date of notification:  01/08/16  Time of notification:  0430  Critical value read back:Yes.    Nurse who received alert: Macario Golds  MD notified (1st page):   Time of first page:    MD notified (2nd page):  Time of second page:  Responding MD:  Dr. Jimmy Footman  Time MD responded:  [phone]

## 2016-01-08 NOTE — Progress Notes (Signed)
PULMONARY / CRITICAL CARE MEDICINE   Name: Curtis Frazier MRN: [id number] DOB: 1941-04-24    ADMISSION DATE:  01/03/2016 CONSULTATION DATE:  01/03/2016  REFERRING MD:  Dr. Dayna Barker EDP  CHIEF COMPLAINT:  SOB/AMS  BRIEF: 75 y/o male with a rheumatoid arthritis and a recent admission for acute respiratory failure with hypoxemia presumably due to HCAP was re-admitted to [facility] on 7/24 with recurrent acute on chronic hypoxemic respiratory failure with a picture consistent with ARDS.   SUBJECTIVE: No acute events overnight. Patient more agitated this morning and attempting to climb out of bed despite bolus fentanyl. Weaned off of Versed infusion yesterday.  REVIEW OF SYSTEMS:  Unable to obtain secondary to intubation.  VITAL SIGNS: BP 119/72   Pulse 99   Temp 99.1 F (37.3 C)   Resp 17   Ht 5\' 8"  (1.727 m)   Wt 158 lb 8.2 oz (71.9 kg)   SpO2 92%   BMI 24.10 kg/m   HEMODYNAMICS: CVP:  [10 mmHg-25 mmHg] 19 mmHg  VENTILATOR SETTINGS: Vent Mode: PRVC FiO2 (%):  [40 %-50 %] 40 % Set Rate:  [14 bmp-18 bmp] 18 bmp Vt Set:  [480 mL-500 mL] 500 mL PEEP:  [5 cmH20] 5 cmH20 Plateau Pressure:  [9 cmH20-18 cmH20] 16 cmH20  INTAKE / OUTPUT: I/O last 3 completed shifts: In: 7693 [I.V.:3803.9; Other:120; [id number]; IV [id number] Out: 7831 [Urine:42; Other:7789]  PHYSICAL EXAMINATION: General:  Sedated. Son at bedside. No distress.  Integument:  Warm & dry. Mild erythematous rash on scrotum. HEENT: Dry mucous membranes. No scleral icterus or injection. Endotracheal tube in place. Cardiovascular:  Regular rate. Trace edema. No appreciable JVD.  Pulmonary:  Coarse breath sounds bilaterally. Symmetric chest wall rise on ventilator. Abdomen: Soft. Hypoactive bowel sounds. Nondistended.  Musculoskeletal:  Normal bulk. No joint deformity or effusion appreciated. Neurological: Sedated now. Pupils symmetric.  LABS:  BMET  Recent Labs Lab 01/07/16 0210 01/07/16 1540 01/08/16 0430  NA  134*  134* 134* 135  K 4.5  4.5 4.7 4.9  CL 103  104 102 102  CO2 26  25 27 26   BUN 27*  27* 27* 29*  CREATININE 1.42*  1.57* 1.63* 1.58*  GLUCOSE 236*  233* 252* 231*    Electrolytes  Recent Labs Lab 01/06/16 0410  01/07/16 0210 01/07/16 1540 01/08/16 0430  CALCIUM 8.3*  8.3*  < > 8.3*  8.3* 8.1* 8.2*  MG 2.3  --  2.5*  --  2.4  PHOS 2.8  < > 3.6 3.1 3.6  < > = values in this interval not displayed.  CBC  Recent Labs Lab 01/06/16 0410 01/07/16 0210 01/08/16 0430  WBC 23.9* 20.9* 20.1*  HGB 11.1* 11.4* 11.1*  HCT 36.4* 37.5* 37.4*  PLT 155 149* 170    Coag's  Recent Labs Lab 01/06/16 0410 01/07/16 0210 01/08/16 0430  APTT 51* 44* 49*  INR 2.93 2.76 2.84    Sepsis Markers  Recent Labs Lab 01/03/16 1905  LATICACIDVEN 1.19    ABG  Recent Labs Lab 01/07/16 0400 01/07/16 1155 01/08/16 0336  PHART 7.267* 7.292* 7.249*  PCO2ART 59.8* 52.2* 61.0*  PO2ART 74.8* 64.8* 71.7*    Liver Enzymes  Recent Labs Lab 01/03/16 1844  01/07/16 0210 01/07/16 1540 01/08/16 0430  AST 56*  --   --   --   --   ALT 40  --   --   --   --   ALKPHOS 127*  --   --   --   --  BILITOT 0.8  --   --   --   --   ALBUMIN 2.1*  < > 2.3* 2.2* 2.4*  < > = values in this interval not displayed.  Cardiac Enzymes  Recent Labs Lab 01/07/16 0156 01/07/16 0800 01/07/16 1400  TROPONINI 0.05* 0.04* 0.04*    Glucose  Recent Labs Lab 01/07/16 1221 01/07/16 1557 01/07/16 2028 01/08/16 0017 01/08/16 0434 01/08/16 0752  GLUCAP 316* 235* 230* 310* 240* 197*    Imaging Dg Chest Port 1 View  Result Date: 01/08/2016 CLINICAL DATA:  Hypoxia. EXAM: PORTABLE CHEST 1 VIEW COMPARISON:  Multiple x-rays since December 28, 2015 FINDINGS: The ETT terminates in the right mainstem bronchus. Recommend withdrawing 5 cm. Other support apparatus is stable. No pneumothorax. Coarse interstitial opacities are seen bilaterally. While there is certainly a chronic component, the  diffuse interstitial opacities are more prominent the interval and there are more focal patchy regions such as in the left lateral lung image suggesting an acute on chronic infiltrate. IMPRESSION: 1. The ETT terminates in the right mainstem bronchus. Recommend withdrawing approximately 5 cm. Other support apparatus is stable. 2. Continued coarse interstitial opacities. While there is a chronic component, the diffuse interstitial opacities are more prominent in the interval and there is more focal infiltrate in the lateral left lung suggesting an acute on chronic process. The findings were called to the patient's nurse. Electronically Signed   By: Dorise Bullion III M.D   On: 01/08/2016 07:36    STUDIES:  CTA Chest 7/15: No CT evidence for acute pulmonary embolus. Interlobular septal thickening with bilateral ground-glass attenuation. Imaging features are compatible with pulmonary edema although superimposed infectious/inflammatory etiology is not excluded. Bibasilar collapse/ consolidation. Interval progression of mediastinal and right hilar lymphadenopathy, nonspecific.  Port CXR 7/29:  ETT entering right main stem. Enteric feeding tube appears to be in the stomach. Right and left central venous catheters in place.  MICROBIOLOGY: MRSA PCR 7/24:  Negative  Respiratory Viral Panel PCR 7/15:  Negative  Blood Ctx x3 7/24>>> 1/3 Coag Neg Staph Urine Ctx 7/24:  Negative  Tracheal Asp Ctx 7/24:  Oral [place] Respiratory Viral Panel PCR 7/24:  Negative  PCP 7/24 > not sent Tracheal Asp Ctx 7/27>>> PJP Trach Asp 7/27:  Negative  Blood Ctx x2 7/27>>>  ANTIBIOTICS: Aztreonam 7/24 >7/25 Imipenem 7/24 - 7/25 Vancomycin 7/24 > 7/25 Levaquin 7/25 >> Meropenem 7/25 >> Linezolid 7/25 >>  Anidulofungin 7/27 >>   SIGNIFICANT EVENTS: 7/22 - discharge to SNF 7/24 - Admit  LINES/TUBES: OETT 8.5 7/24 >> OGT 7/24 >> L Subclavian CVL 7/25 >>  R IJ HD CVL 7/25 >> Foley 7/25 >> PIV x1  ASSESSMENT /  PLAN:  PULMONARY A: Acute Hypoxic Respiratory Failure - Improving. Secondary to ARDS/HCAP. ARDS - Switched off ARDS protocol 7/29. HCAP vs Autoimmune Process H/O COPD/Emphysema Mediastinal Lymphadenopathy H/O Sleep Apnea  P:   Continuing Full Vent Support Wean FiO2 for Sat >94% SBT/WUA when FiO2/PEEP improved Repeat Port CXR to confirm ETT position Continue Solu-Medrol IV q12hr Duoneb q6hr  INFECTIOUS A:   HCAP Possible Bacteremia - Staph likely contaminate. Scrotal Rash - Looks like yeast/fungus.  P:   ID Following & guiding antibiotics Levaquin Day #5; Meropenem Day #5; Linezolid Day #5, & Eraxis Day #3 Awaiting finalization of cultures Starting Nystatin Powder to groin & Nystatin S&S for tongue today  CARDIOVASCULAR A:  Abnormal EKG 7.28 - ST elevation RCA distribution, resolved spontaneously without hemodynamic change, uncertain etiology. Shock - Likely  sedation & CVVHD related.  H/O CAD H/O Carotid Artery Disease H/O Chronic Diastolic CHF H/O HTN H/O A fib S/P Ablation H/O Hyperlipidemia H/O Moderate Aortic Stenosis  P:  Continuous Telemetry Monitoring Vitals per unit protocol Neo-Synephrine to maintain MAP >65 ASA 81mg  daily Repeat echo if hemodynamics worsen  RENAL A:   Acute on Chronic Renal Failure Stage 3 - Oliguric to anuric.  Metabolic Acidosis - Resolved.  P:   Nephrology Following & guiding dialysis Continuing CVVHD Goal Net Even Volume Status Trending electrolytes q12hr Replacing electrolytes as indicated  GASTROINTESTINAL A:   H/O GERD & Gastritis H/O Hemorrhoids H/O Colon Polyps  P:   NPO Continue Tube Feeds Pepcid IV q24hr Starting Senna bid VT  HEMATOLOGIC A:   Coagulopathy - Secondary to Coumadin. Leukocytosis - Likely multifactorial. Stable. Anemia - No signs of active bleeding. Mild & Hgb stable.  P:  Trending cell counts daily w/ CBC Daily INR Transfuse for active bleeding of Hgb <8.0  ENDOCRINE A:   Chronic  Prednisone - For RA/PMR. H/O DM Type 2 - Hyperglycemia.  P:   Continue IV Solu-Medrol daily Starting Lantus 20u Arp q24hr (home dose Levemir 15u daily) Continue Accu-Checks q4hr  Continue Moderate Dose SSI per algorithm  NEUROLOGIC A:   Acute Encephalopathy - Multifactorial. Likely combination of metabolic & hypoxemia. Sedation on Ventilator   P:   RASS Goal: 0 to -1 Fentanyl gtt & IV prn for Pain Versed IV prn Sedation  FAMILY  - Updates: Son updated by Dr. Ashok Cordia 7/29.  - Inter-disciplinary family meet or Palliative Care meeting due by: 7/30  TODAY'S SUMMARY:  75 year old male with baseline RA and COPD and a recent admission for acute respiratory failure with hypoxemia was admitted on 7/24 with ARDS most likely due to HCAP but there has been question of underlying ILD related to his rheumatoid arthritidis.  Has multi-organ failure.  cultures remain negative. Given improvement on ventilator support I am continuing current dose of Solu-Medrol. I did caution the patient's son at bedside that complete renal recovery may not be possible but will defer to nephrology on expectations. Repeating chest x-ray to confirm endotracheal tube placement/withdrawal. Appreciate assistance from infectious diseases and nephrology consultants.  I have spent a total of 39 minutes of critical care time today caring for the patient and reviewing the patient's electronic medical record.  Sonia Baller Ashok Cordia, M.D. [facility] Pager:  [phone] After 3pm or if no response, call [phone] 01/08/2016 11:25 AM

## 2016-01-08 NOTE — Progress Notes (Signed)
Pt's daughter [name]) and grandson were bedside when [employer] arrived. She spoke of how many were praying for her dad and family. I learned there are six children; pt's wife and another sibling were on the way. Pt's daughter shared pictures of pt and concluded that God is in control. She admitted that the longer he is in his present condition, it does not look good. She still has hope while accepting updates from the doctors. CH had prayer w/family. Please page if additional support is needed.  [place], M.Div.   01/08/16 1500  Clinical Encounter Type  Visited With Family

## 2016-01-09 DIAGNOSIS — I9589 Other hypotension: Secondary | ICD-10-CM

## 2016-01-09 LAB — CBC WITH DIFFERENTIAL/PLATELET
BASOS PCT: 0 %
Basophils Absolute: 0 10*3/uL (ref 0.0–0.1)
EOS ABS: 0 10*3/uL (ref 0.0–0.7)
EOS PCT: 0 %
HEMATOCRIT: 37.6 % — AB (ref 39.0–52.0)
Hemoglobin: 11.2 g/dL — ABNORMAL LOW (ref 13.0–17.0)
Lymphocytes Relative: 2 %
Lymphs Abs: 0.4 10*3/uL — ABNORMAL LOW (ref 0.7–4.0)
MCH: 29.3 pg (ref 26.0–34.0)
MCHC: 29.8 g/dL — AB (ref 30.0–36.0)
MCV: 98.4 fL (ref 78.0–100.0)
MONO ABS: 0.9 10*3/uL (ref 0.1–1.0)
MONOS PCT: 4 %
NEUTROS ABS: 20.9 10*3/uL — AB (ref 1.7–7.7)
Neutrophils Relative %: 94 %
PLATELETS: 186 10*3/uL (ref 150–400)
RBC: 3.82 MIL/uL — ABNORMAL LOW (ref 4.22–5.81)
RDW: 18 % — AB (ref 11.5–15.5)
WBC: 22.1 10*3/uL — ABNORMAL HIGH (ref 4.0–10.5)

## 2016-01-09 LAB — GLUCOSE, CAPILLARY: Glucose-Capillary: 212 mg/dL — ABNORMAL HIGH (ref 65–99)

## 2016-01-09 LAB — RENAL FUNCTION PANEL
ALBUMIN: 2.5 g/dL — AB (ref 3.5–5.0)
ANION GAP: 5 (ref 5–15)
Albumin: 2.4 g/dL — ABNORMAL LOW (ref 3.5–5.0)
Anion gap: 6 (ref 5–15)
BUN: 27 mg/dL — ABNORMAL HIGH (ref 6–20)
BUN: 30 mg/dL — AB (ref 6–20)
CALCIUM: 8.3 mg/dL — AB (ref 8.9–10.3)
CHLORIDE: 103 mmol/L (ref 101–111)
CO2: 26 mmol/L (ref 22–32)
CO2: 27 mmol/L (ref 22–32)
CREATININE: 1.3 mg/dL — AB (ref 0.61–1.24)
Calcium: 8.5 mg/dL — ABNORMAL LOW (ref 8.9–10.3)
Chloride: 104 mmol/L (ref 101–111)
Creatinine, Ser: 1.24 mg/dL (ref 0.61–1.24)
GFR calc Af Amer: 60 mL/min — ABNORMAL LOW (ref >60)
GFR calc Af Amer: >60 mL/min (ref >60)
GFR calc non Af Amer: 52 mL/min — ABNORMAL LOW (ref >60)
GFR calc non Af Amer: 55 mL/min — ABNORMAL LOW (ref >60)
GLUCOSE: 216 mg/dL — AB (ref 65–99)
GLUCOSE: 191 mg/dL — AB (ref 65–99)
PHOSPHORUS: 4.1 mg/dL (ref 2.5–4.6)
PHOSPHORUS: 3.7 mg/dL (ref 2.5–4.6)
POTASSIUM: 4.1 mmol/L (ref 3.5–5.1)
Potassium: 4.7 mmol/L (ref 3.5–5.1)
SODIUM: 135 mmol/L (ref 135–145)
Sodium: 136 mmol/L (ref 135–145)

## 2016-01-09 LAB — BLOOD GAS, ARTERIAL
ACID-BASE DEFICIT: 2.1 mmol/L — AB (ref 0.0–2.0)
BICARBONATE: 25.8 meq/L — AB (ref 20.0–24.0)
Drawn by: 252031
FIO2: 0.4
LHR: 18 {breaths}/min
O2 SAT: 92.7 %
PATIENT TEMPERATURE: 98.6
PCO2 ART: 62.9 mmHg — AB (ref 35.0–45.0)
PEEP/CPAP: 5 cmH2O
PH ART: 7.236 — AB (ref 7.350–7.450)
TCO2: 24.5 mmol/L (ref 0–100)
VT: 500 mL
pO2, Arterial: 73.8 mmHg — ABNORMAL LOW (ref 80.0–100.0)

## 2016-01-09 LAB — TROPONIN I
TROPONIN I: 0.04 ng/mL — AB (ref <0.03)
TROPONIN I: 0.03 ng/mL — AB (ref <0.03)
Troponin I: 0.03 ng/mL (ref <0.03)

## 2016-01-09 LAB — APTT: aPTT: 44 seconds — ABNORMAL HIGH (ref 24–36)

## 2016-01-09 LAB — PROTIME-INR
INR: 2.53
Prothrombin Time: 27.7 seconds — ABNORMAL HIGH (ref 11.4–15.2)

## 2016-01-09 LAB — MAGNESIUM: Magnesium: 2.4 mg/dL (ref 1.7–2.4)

## 2016-01-09 MED ORDER — DEXMEDETOMIDINE HCL IN NACL 200 MCG/50ML IV SOLN
0.4000 ug/kg/h | INTRAVENOUS | Status: DC
  Administered 2016-01-09: 0.4 ug/kg/h via INTRAVENOUS
  Filled 2016-01-09: qty 50

## 2016-01-09 MED ORDER — BISACODYL 10 MG RE SUPP
10.0000 mg | Freq: Once | RECTAL | Status: DC
  Filled 2016-01-09: qty 1

## 2016-01-09 MED ORDER — INSULIN NPH (HUMAN) (ISOPHANE) 100 UNIT/ML ~~LOC~~ SUSP
15.0000 [IU] | Freq: Three times a day (TID) | SUBCUTANEOUS | Status: DC
  Administered 2016-01-09 – 2016-01-10 (×5): 15 [IU] via SUBCUTANEOUS
  Filled 2016-01-09 (×2): qty 10

## 2016-01-09 NOTE — Progress Notes (Addendum)
ST changes noted on monitor. EKG done. Dr. Ashok Cordia made aware of EKG results. Orders received to send troponin.

## 2016-01-09 NOTE — Progress Notes (Signed)
CRITICAL VALUE ALERT  Critical value received:  CO2  Date of notification:  01/09/16   Time of notification:  [id number]  Critical value read back:Yes.    Nurse who received alert:  [place]  MD notified (1st page):  Dr. Jimmy Footman  Responding MD:  Dr. Jimmy Footman  Time MD responded:  [phone]

## 2016-01-09 NOTE — Discharge Instructions (Signed)
Acute Respiratory Distress Syndrome Acute respiratory distress syndrome is a life-threatening condition in which fluid collects in the lungs. This condition can cause severe shortness of breath and low oxygen levels in the blood. It can also cause the lungs and other vital organs to fail. The condition usually develops within 24 to 48 hours of an infection, illness, surgery, or injury. CAUSES This condition may be caused by:  An infection, such as sepsis or pneumonia.  A serious injury to the head or chest.  A major surgery.  A drug overdose.  Breathing in harmful chemicals or smoke.  Blood transfusions.  A blood clot in the lungs. SYMPTOMS Symptoms of this condition include:  Fever.  Difficulty breathing.  Bluish skin (cyanosis).  A fast or irregular heartbeat.  Low blood pressure (hypotension).  Agitation.  Confusion.  Lack of energy (lethargy).  Sweating. DIAGNOSIS This condition may be diagnosed by using tests to rule out other diseases and conditions that cause similar symptoms. You may have:  A chest X-ray or CT scan.  Blood tests.  A sputum culture. In this test, you will be asked to spit so that a sample of lung fluid can be taken for testing.  Bronchoscopy. During this test a thin, flexible tool is passed into the mouth or nose, down the windpipe, and into the lungs. TREATMENT This condition may be treated with:  Oxygen. A breathing machine (ventilator) is often used to provide oxygen and help with breathing.  Medicine to help you relax (sedative).  Fluids and nutrients given through an IV tube.  Blood pressure medicine.  Steroid medicine to help decrease inflammation in the lungs.  Diuretic medicine to get rid of extra fluid in the body. Additional treatment may be needed, depending on the cause of the condition. It can take up to 12 months to recover from this condition. Some people recover fully, but others may continue to  have:  Weakness.  Shortness of breath.  Memory problems.  Depression. HOME CARE INSTRUCTIONS Until you recover from this condition:  Do not smoke.  Limit alcohol intake to no more than 1 drink per day for nonpregnant women and 2 drinks per day for men. One drink equals 12 oz of beer, 5 oz of wine, or 1 oz of hard liquor.  Ask friends and family to help you if daily activities make you tired. SEEK MEDICAL CARE IF:  You become short of breath with activity or while resting.  You develop a cough that does not go away.  You have a fever. SEEK IMMEDIATE MEDICAL CARE IF:  You have sudden shortness of breath.  You develop chest pain that does not go away.  You develop swelling or pain in one of your legs.  You cough up blood.   This information is not intended to replace advice given to you by your health care provider. Make sure you discuss any questions you have with your health care provider.   Document Released: 05/29/2005 Document Revised: 10/13/2014 Document Reviewed: 05/25/2014 Elsevier Interactive Patient Education 2016 [employer]. Acute Respiratory Distress Syndrome Acute respiratory distress syndrome is a life-threatening condition in which fluid collects in the lungs. This condition can cause severe shortness of breath and low oxygen levels in the blood. It can also cause the lungs and other vital organs to fail. The condition usually develops within 24 to 48 hours of an infection, illness, surgery, or injury. CAUSES This condition may be caused by:  An infection, such as sepsis or  pneumonia.  A serious injury to the head or chest.  A major surgery.  A drug overdose.  Breathing in harmful chemicals or smoke.  Blood transfusions.  A blood clot in the lungs. SYMPTOMS Symptoms of this condition include:  Fever.  Difficulty breathing.  Bluish skin (cyanosis).  A fast or irregular heartbeat.  Low blood pressure  (hypotension).  Agitation.  Confusion.  Lack of energy (lethargy).  Sweating. DIAGNOSIS This condition may be diagnosed by using tests to rule out other diseases and conditions that cause similar symptoms. You may have:  A chest X-ray or CT scan.  Blood tests.  A sputum culture. In this test, you will be asked to spit so that a sample of lung fluid can be taken for testing.  Bronchoscopy. During this test a thin, flexible tool is passed into the mouth or nose, down the windpipe, and into the lungs. TREATMENT This condition may be treated with:  Oxygen. A breathing machine (ventilator) is often used to provide oxygen and help with breathing.  Medicine to help you relax (sedative).  Fluids and nutrients given through an IV tube.  Blood pressure medicine.  Steroid medicine to help decrease inflammation in the lungs.  Diuretic medicine to get rid of extra fluid in the body. Additional treatment may be needed, depending on the cause of the condition. It can take up to 12 months to recover from this condition. Some people recover fully, but others may continue to have:  Weakness.  Shortness of breath.  Memory problems.  Depression. HOME CARE INSTRUCTIONS Until you recover from this condition:  Do not smoke.  Limit alcohol intake to no more than 1 drink per day for nonpregnant women and 2 drinks per day for men. One drink equals 12 oz of beer, 5 oz of wine, or 1 oz of hard liquor.  Ask friends and family to help you if daily activities make you tired. SEEK MEDICAL CARE IF:  You become short of breath with activity or while resting.  You develop a cough that does not go away.  You have a fever. SEEK IMMEDIATE MEDICAL CARE IF:  You have sudden shortness of breath.  You develop chest pain that does not go away.  You develop swelling or pain in one of your legs.  You cough up blood.   This information is not intended to replace advice given to you by your  health care provider. Make sure you discuss any questions you have with your health care provider.   Document Released: 05/29/2005 Document Revised: 10/13/2014 Document Reviewed: 05/25/2014 Elsevier Interactive Patient Education [employer].

## 2016-01-09 NOTE — Progress Notes (Signed)
[facility] Progress Note   Subjective: CXR better today for first time, making a little urine today  Vitals:   01/09/16 1430 01/09/16 1445 01/09/16 1500 01/09/16 1515  BP: (!) 109/58 (!) 103/52 (!) 72/45 (!) 82/51  Pulse:      Resp: 18 (!) _0 Temp:      TempSrc:      SpO2: 97% 98% 97% 96%  Weight:      Height:        Inpatient medications: . anidulafungin  100 mg Intravenous Q24H  . antiseptic oral rinse  7 mL Mouth Rinse QID  . aspirin  81 mg Oral Daily  . bisacodyl  10 mg Rectal Once  . chlorhexidine gluconate (SAGE KIT)  15 mL Mouth Rinse BID  . famotidine (PEPCID) IV  20 mg Intravenous Q24H  . insulin aspart  0-15 Units Subcutaneous Q4H  . insulin NPH Human  15 Units Subcutaneous Q8H  . ipratropium-albuterol  3 mL Nebulization Q6H  . levofloxacin (LEVAQUIN) IV  500 mg Intravenous Q24H  . linezolid (ZYVOX) IV  600 mg Intravenous Q12H  . meropenem (MERREM) IV  1 g Intravenous Q12H  . methylPREDNISolone (SOLU-MEDROL) injection  40 mg Intravenous Q12H  . nystatin  5 mL Oral TID  . nystatin   Topical TID  . sennosides  5 mL Per Tube Q12H   . sodium chloride 75 mL/hr at 01/09/16 1500  . dexmedetomidine Stopped (01/09/16 1302)  . feeding supplement (VITAL AF 1.2 CAL) 1,000 mL (01/09/16 1500)  . fentaNYL infusion INTRAVENOUS 200 mcg/hr (01/09/16 1500)  . phenylephrine (NEO-SYNEPHRINE) Adult infusion 30 mcg/min (01/09/16 1500)  . dialysis replacement fluid (prismasate) 400 mL/hr at 01/09/16 0711  . dialysis replacement fluid (prismasate) 200 mL/hr at 01/08/16 2137  . dialysate (PRISMASATE) 2,000 mL/hr at 01/09/16 1455   sodium chloride, sodium chloride, albuterol, alteplase, fentaNYL, heparin, ipratropium, midazolam  Exam: Gen on vent, unresponsive, sedated Sclera anicteric, throat w ETT  No jvd or bruits Chest some post crackles at bases RRR no MRG Abd soft nondistended no ascites or mass GU normal male w Foley Ext no LE or UE edema Neuro as  above  CT chest > underlying emphysema w bilat patchy infiltrates/ ground glass changes, LL > UL's CXR 7.25 > patchy bilat infiltrates UA - negative CVP 10-12  Assessment: 1  Acute on CRF3 - ischemic ATN due to shock most likely, CRRT D#5 2  Recurrent resp failure - ARDS + prob HCAP 3  Volume - CVP 12, stable wts, no vol excess 4  COPD underlying 5  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8  --   --   --   --   PROT 7.1  --   --   --   --   ALBUMIN 2.1*  < > 2.4* 2.3* 2.4*  < > = values in this interval not displayed.  Recent Labs Lab 01/07/16 0210 01/08/16 0430 01/09/16 0400  WBC 20.9* 20.1* 22.1*  NEUTROABS 19.5* 18.5* 20.9*  HGB 11.4* 11.1* 11.2*  HCT 37.5* 37.4* 37.6*  MCV 97.9 98.2 98.4  PLT 149* 170 186   Iron/TIBC/Ferritin/ %Sat    Component Value Date/Time   IRON 24 (L) 08/13/2015 0430   TIBC 258 08/13/2015 0430   FERRITIN 147 08/13/2015 0430   IRONPCTSAT 9 (L) 08/13/2015 0430

## 2016-01-09 NOTE — Progress Notes (Signed)
PULMONARY / CRITICAL CARE MEDICINE   Name: Curtis Frazier MRN: [id number] DOB: [birth year]    ADMISSION DATE:  01/03/2016 CONSULTATION DATE:  01/03/2016  REFERRING MD:  Dr. Dayna Barker EDP  CHIEF COMPLAINT:  SOB/AMS  BRIEF: 75 y/o male with a rheumatoid arthritis and a recent admission for acute respiratory failure with hypoxemia presumably due to HCAP was re-admitted to [facility] on 7/24 with recurrent acute on chronic hypoxemic respiratory failure with a picture consistent with ARDS.   SUBJECTIVE: Treated this morning requiring IV Versed. Patient previously weaned off of vasopressor but this had to be restarted with sedation. Also noted to have ST segment changes on telemetry.  REVIEW OF SYSTEMS:  Unable to obtain secondary to intubation.  VITAL SIGNS: BP (!) 105/52   Pulse 86   Temp 98.8 F (37.1 C)   Resp 18   Ht 5\' 8"  (1.727 m)   Wt 154 lb 15.7 oz (70.3 kg)   SpO2 97%   BMI 23.57 kg/m   HEMODYNAMICS: CVP:  [12 mmHg-19 mmHg] 12 mmHg  VENTILATOR SETTINGS: Vent Mode: PRVC FiO2 (%):  [40 %] 40 % Set Rate:  [18 bmp] 18 bmp Vt Set:  [500 mL] 500 mL PEEP:  [5 cmH20-8 cmH20] 8 cmH20 Plateau Pressure:  [14 cmH20-19 cmH20] 16 cmH20  INTAKE / OUTPUT: I/O last 3 completed shifts: In: 7555.2 [I.V.:3702.2; Other:163; NG/GT:2220; IV [id number] Out: [id number] [Urine:20; Other:7764]  PHYSICAL EXAMINATION: General:  Sedated. No family at bedside. No distress.  Integument:  Warm & dry. Mild erythematous rash on scrotum. HEENT: Dry mucous membranes. No scleral icterus. Endotracheal tube in place. Cardiovascular:  Regular rate. Trace edema. No appreciable JVD with body positioning.  Pulmonary:  Coarse breath sounds bilaterally. Symmetric chest wall rise on ventilator. Abdomen: Soft. Hypoactive bowel sounds unchanged. Protuberant.  Musculoskeletal:  Normal bulk. No joint deformity or effusion appreciated. Neurological: Sedated now. Pupils symmetric.  LABS:  BMET  Recent Labs Lab  01/08/16 0430 01/08/16 1639 01/09/16 0400  NA 135 133* 135  K 4.9 4.6 4.7  CL 102 101 104  CO2 26 27 26   BUN 29* 29* 27*  CREATININE 1.58* 1.54* 1.30*  GLUCOSE 231* 233* 216*    Electrolytes  Recent Labs Lab 01/07/16 0210  01/08/16 0430 01/08/16 1639 01/09/16 0400  CALCIUM 8.3*  8.3*  < > 8.2* 8.2* 8.3*  MG 2.5*  --  2.4  --  2.4  PHOS 3.6  < > 3.6 3.5 4.1  < > = values in this interval not displayed.  CBC  Recent Labs Lab 01/07/16 0210 01/08/16 0430 01/09/16 0400  WBC 20.9* 20.1* 22.1*  HGB 11.4* 11.1* 11.2*  HCT 37.5* 37.4* 37.6*  PLT 149* 170 186    Coag's  Recent Labs Lab 01/07/16 0210 01/08/16 0430 01/09/16 0400  APTT 44* 49* 44*  INR 2.76 2.84 2.53    Sepsis Markers  Recent Labs Lab 01/03/16 1905  LATICACIDVEN 1.19    ABG  Recent Labs Lab 01/07/16 1155 01/08/16 0336 01/09/16 0336  PHART 7.292* 7.249* 7.236*  PCO2ART 52.2* 61.0* 62.9*  PO2ART 64.8* 71.7* 73.8*    Liver Enzymes  Recent Labs Lab 01/03/16 1844  01/08/16 0430 01/08/16 1639 01/09/16 0400  AST 56*  --   --   --   --   ALT 40  --   --   --   --   ALKPHOS 127*  --   --   --   --   BILITOT 0.8  --   --   --   --  ALBUMIN 2.1*  < > 2.4* 2.3* 2.4*  < > = values in this interval not displayed.  Cardiac Enzymes  Recent Labs Lab 01/07/16 0156 01/07/16 0800 01/07/16 1400  TROPONINI 0.05* 0.04* 0.04*    Glucose  Recent Labs Lab 01/08/16 0752 01/08/16 1204 01/08/16 1634 01/08/16 2016 01/08/16 2334 01/09/16 0337  GLUCAP 197* 264* 241* 209* 213* 212*    Imaging Dg Chest Port 1 View  Result Date: 01/08/2016 CLINICAL DATA:  Repositioning of endotracheal tube. EXAM: PORTABLE CHEST 1 VIEW COMPARISON:  One-view chest from the same day. FINDINGS: The endotracheal tube has been pulled back and a terminates 4.3 cm above the carina, in satisfactory position. A right IJ sheath is stable. A left subclavian line is stable. NG tube courses off the inferior border  the film. The heart is enlarged. Diffuse interstitial and airspace disease is improving. NG airspace disease remains worse on the left. IMPRESSION: 1. Satisfactory positioning of the endotracheal tube which now terminates 4.3 cm above the carina. 2. Support apparatus is otherwise stable. 3. Improving diffuse interstitial and airspace disease. Airspace component remains greatest on the left. Electronically Signed   By: San Morelle M.D.   On: 01/08/2016 12:18    STUDIES:  CTA Chest 7/15: No CT evidence for acute pulmonary embolus. Interlobular septal thickening with bilateral ground-glass attenuation. Imaging features are compatible with pulmonary edema although superimposed infectious/inflammatory etiology is not excluded. Bibasilar collapse/ consolidation. Interval progression of mediastinal and right hilar lymphadenopathy, nonspecific.  TTE 7/26: Mild concentric LVH. EF 55-60%. Grade 1 diastolic dysfunction. LA severely dilated & RA normal in size. RV normal in size and function. Pulmonary artery systolic pressure 40 mmHg. Moderate aortic stenosis without regurgitation. Aortic root normal in size. No mitral stenosis with mild regurgitation. No pulmonic stenosis. Moderate tricuspid regurgitation. No pericardial effusion. Port CXR 7/29:  ETT entering right main stem. Enteric feeding tube appears to be in the stomach. Right and left central venous catheters in place. Port CXR 7/29: Endotracheal tube in good position. EKG 7/30:  Sinus rhythm. Lateral ST segment elevation w/ QTc 342ms.  MICROBIOLOGY: MRSA PCR 7/24:  Negative  Respiratory Viral Panel PCR 7/15:  Negative  Blood Ctx x4 7/24: 1/4 Coag Neg Staph Urine Ctx 7/24:  Negative  Tracheal Asp Ctx 7/24:  Oral Flora Respiratory Viral Panel PCR 7/24:  Negative  PCP 7/24 > not sent Tracheal Asp Ctx 7/27: Normal respiratory flora PJP Trach Asp 7/27:  Negative  Blood Ctx x2 7/27>>>  ANTIBIOTICS: Aztreonam 7/24 >7/25 Imipenem 7/24 -  7/25 Vancomycin 7/24 > 7/25 Levaquin 7/25 >> Meropenem 7/25 >> Linezolid 7/25 >>  Anidulofungin 7/27 >>   SIGNIFICANT EVENTS: 7/22 - discharge to SNF 7/24 - Admit  LINES/TUBES: OETT 8.5 7/24 >> OGT 7/24 >> L Subclavian CVL 7/25 >>  R IJ HD CVL 7/25 >> Foley 7/25 >> PIV x1  ASSESSMENT / PLAN:  PULMONARY A: Acute Hypoxic Respiratory Failure - Improving. Secondary to ARDS/HCAP. ARDS - Switched off ARDS protocol 7/29. HCAP vs Autoimmune Process H/O COPD/Emphysema Mediastinal Lymphadenopathy H/O Sleep Apnea  P:   Continuing Full Vent Support Wean FiO2 for Sat >94% SBT/WUA daily Continue Solu-Medrol IV q12hr Duoneb q6hr  INFECTIOUS A:   HCAP - Cultures negative but BAL lost from admission. Possible Bacteremia - Staph likely contaminate. Repeat cultures on 7/27 pending. Scrotal Rash - Looks like yeast/fungus.  P:   ID Following & guiding antibiotics Levaquin Day #5; Meropenem Day #6; Linezolid Day #6, & Eraxis Day #4 Awaiting  finalization of blood cultures Nystatin Powder to groin & Nystatin S&S for tongue today  CARDIOVASCULAR A:  Abnormal EKG - ST elevation RCA distribution, resolved spontaneously without hemodynamic change, uncertain etiology. Seen on 7/28 & again 7/30. Suspect demand with hypotension in setting of moderate aortic stenosis. Shock - Likely sedation & CVVHD related.  H/O CAD H/O Carotid Artery Disease H/O Chronic Diastolic CHF H/O HTN H/O A fib S/P Ablation H/O Hyperlipidemia H/O Moderate Aortic Stenosis  P:  Continuous Telemetry Monitoring Vitals per unit protocol Neo-Synephrine to maintain MAP >65 ASA VT 81mg  daily Trending Troponin I - already anticoagulated Consider Statin if bowels move  RENAL A:   Acute on Chronic Renal Failure Stage 3 - Oliguric to anuric.  Metabolic Acidosis - Resolved.  P:   Nephrology Following & guiding dialysis Continuing CVVHD Goal Net Even Volume Status Trending electrolytes q12hr Replacing  electrolytes as indicated  GASTROINTESTINAL A:   Constipation H/O GERD & Gastritis H/O Hemorrhoids H/O Colon Polyps  P:   NPO Continue Tube Feeds Pepcid IV q24hr Senna bid VT Dulcolax Supp PR x1  HEMATOLOGIC A:   Coagulopathy - Secondary to Coumadin. Leukocytosis - Likely multifactorial. Unchanged. Anemia - No signs of active bleeding. Mild & Hgb stable.  P:  Trending cell counts daily w/ CBC Daily INR Transfuse for active bleeding of Hgb <8.0  ENDOCRINE A:   Chronic Prednisone - For RA/PMR. H/O DM Type 2 - Hyperglycemia improved but still significant.  P:   Continue IV Solu-Medrol daily D/C Lantus Start NPH 15u [place] q8hr at noon (home dose Levemir 15u daily) Continue Accu-Checks q4hr  Continue Moderate Dose SSI per algorithm  NEUROLOGIC A:   Acute Encephalopathy - Multifactorial. Likely combination of metabolic & hypoxemia. Sedation on Ventilator   P:   RASS Goal: 0 to -1 Fentanyl gtt & IV prn for Pain Versed IV prn Sedation Trying Precedex gtt  FAMILY  - Updates: Son updated by Dr. Ashok Cordia 7/29.  TODAY'S SUMMARY:  75 year old male with baseline RA and COPD and a recent admission for acute respiratory failure with hypoxemia was admitted on 7/24 with ARDS most likely due to HCAP but there has been question of underlying ILD related to his rheumatoid arthritidis.  Has multi-organ failure.  Respiratory cultures continue to be negative. Original lavage was lost from admission. Continuing broad-spectrum antibiotics. Increasing insulin treatment for persistent hypoglycemia. Checking troponin I given EKG changes. Patient already systemically anticoagulated. Considering repeat echocardiogram if troponin I is elevated. Attempting to maintain mean arterial pressure. Trying Precedex to help limit need for intermittent sedation & weaning fentanyl infusion.  I have spent a total of 34 minutes of critical care time today caring for the patient and reviewing the patient's  electronic medical record.  Sonia Baller Ashok Cordia, M.D. [facility] Pulmonary & Critical Care Pager:  [phone] After 3pm or if no response, call [phone] 01/09/2016 7:46 AM

## 2016-01-10 ENCOUNTER — Ambulatory Visit: Payer: Medicare Other | Admitting: Family Medicine

## 2016-01-10 ENCOUNTER — Inpatient Hospital Stay (HOSPITAL_COMMUNITY): Payer: Medicare Other

## 2016-01-10 LAB — RENAL FUNCTION PANEL
ALBUMIN: 2.3 g/dL — AB (ref 3.5–5.0)
Albumin: 2.3 g/dL — ABNORMAL LOW (ref 3.5–5.0)
Anion gap: 5 (ref 5–15)
Anion gap: 5 (ref 5–15)
BUN: 33 mg/dL — AB (ref 6–20)
BUN: 33 mg/dL — AB (ref 6–20)
CALCIUM: 8.6 mg/dL — AB (ref 8.9–10.3)
CHLORIDE: 105 mmol/L (ref 101–111)
CO2: 27 mmol/L (ref 22–32)
CO2: 25 mmol/L (ref 22–32)
CREATININE: 1.55 mg/dL — AB (ref 0.61–1.24)
CREATININE: 1.52 mg/dL — AB (ref 0.61–1.24)
Calcium: 8.1 mg/dL — ABNORMAL LOW (ref 8.9–10.3)
Chloride: 106 mmol/L (ref 101–111)
GFR calc Af Amer: 48 mL/min — ABNORMAL LOW (ref >60)
GFR calc Af Amer: 50 mL/min — ABNORMAL LOW (ref >60)
GFR calc non Af Amer: 43 mL/min — ABNORMAL LOW (ref >60)
GFR, EST NON AFRICAN AMERICAN: 42 mL/min — AB (ref >60)
Glucose, Bld: 147 mg/dL — ABNORMAL HIGH (ref 65–99)
Glucose, Bld: 193 mg/dL — ABNORMAL HIGH (ref 65–99)
PHOSPHORUS: 4.1 mg/dL (ref 2.5–4.6)
POTASSIUM: 4.5 mmol/L (ref 3.5–5.1)
POTASSIUM: 4.4 mmol/L (ref 3.5–5.1)
Phosphorus: 3.8 mg/dL (ref 2.5–4.6)
SODIUM: 138 mmol/L (ref 135–145)
Sodium: 135 mmol/L (ref 135–145)

## 2016-01-10 LAB — GLUCOSE, CAPILLARY
GLUCOSE-CAPILLARY: 128 mg/dL — AB (ref 65–99)
GLUCOSE-CAPILLARY: 135 mg/dL — AB (ref 65–99)
GLUCOSE-CAPILLARY: 177 mg/dL — AB (ref 65–99)
GLUCOSE-CAPILLARY: 196 mg/dL — AB (ref 65–99)
GLUCOSE-CAPILLARY: 198 mg/dL — AB (ref 65–99)
GLUCOSE-CAPILLARY: 211 mg/dL — AB (ref 65–99)
GLUCOSE-CAPILLARY: 252 mg/dL — AB (ref 65–99)
Glucose-Capillary: 160 mg/dL — ABNORMAL HIGH (ref 65–99)
Glucose-Capillary: 309 mg/dL — ABNORMAL HIGH (ref 65–99)
Glucose-Capillary: 214 mg/dL — ABNORMAL HIGH (ref 65–99)

## 2016-01-10 LAB — APTT: aPTT: 41 seconds — ABNORMAL HIGH (ref 24–36)

## 2016-01-10 LAB — PROTIME-INR
INR: 2.29
Prothrombin Time: 25.6 seconds — ABNORMAL HIGH (ref 11.4–15.2)

## 2016-01-10 LAB — CBC WITH DIFFERENTIAL/PLATELET
Basophils Absolute: 0 10*3/uL (ref 0.0–0.1)
Basophils Relative: 0 %
EOS PCT: 0 %
Eosinophils Absolute: 0 10*3/uL (ref 0.0–0.7)
HEMATOCRIT: 37.9 % — AB (ref 39.0–52.0)
Hemoglobin: 11.4 g/dL — ABNORMAL LOW (ref 13.0–17.0)
LYMPHS ABS: 0.6 10*3/uL — AB (ref 0.7–4.0)
LYMPHS PCT: 3 %
MCH: 29.5 pg (ref 26.0–34.0)
MCHC: 30.1 g/dL (ref 30.0–36.0)
MCV: 97.9 fL (ref 78.0–100.0)
MONO ABS: 0.7 10*3/uL (ref 0.1–1.0)
MONOS PCT: 3 %
NEUTROS ABS: 18.1 10*3/uL — AB (ref 1.7–7.7)
Neutrophils Relative %: 94 %
PLATELETS: 189 10*3/uL (ref 150–400)
RBC: 3.87 MIL/uL — ABNORMAL LOW (ref 4.22–5.81)
RDW: 18 % — AB (ref 11.5–15.5)
WBC: 19.4 10*3/uL — ABNORMAL HIGH (ref 4.0–10.5)

## 2016-01-10 LAB — MAGNESIUM: MAGNESIUM: 2.5 mg/dL — AB (ref 1.7–2.4)

## 2016-01-10 LAB — TROPONIN I: Troponin I: 0.04 ng/mL (ref <0.03)

## 2016-01-10 MED ORDER — METHYLPREDNISOLONE SODIUM SUCC 40 MG IJ SOLR
40.0000 mg | Freq: Every day | INTRAMUSCULAR | Status: DC
Start: 2016-01-11 — End: 2016-01-19
  Administered 2016-01-11 – 2016-01-18 (×8): 40 mg via INTRAVENOUS
  Filled 2016-01-10 (×8): qty 1

## 2016-01-10 MED ORDER — PANTOPRAZOLE SODIUM 40 MG PO PACK
40.0000 mg | PACK | ORAL | Status: DC
  Administered 2016-01-10 – 2016-01-11 (×2): 40 mg
  Filled 2016-01-10 (×2): qty 20

## 2016-01-10 NOTE — Progress Notes (Signed)
RN called E-link to report abnormal EKG and positive troponin. Will continue to monitor.

## 2016-01-10 NOTE — Progress Notes (Signed)
Pt placed back on full vent support due to repeated alarming due to low MV.

## 2016-01-10 NOTE — Progress Notes (Addendum)
Pt placed back on full vent support due to decreased pt effort. RN, NP, and MD notified. Family present in room.

## 2016-01-10 NOTE — Progress Notes (Signed)
Nutrition Follow-up  DOCUMENTATION CODES:   Non-severe (moderate) malnutrition in context of acute illness/injury  INTERVENTION:  -Continue Vital 1.2 @ 60 mL/hr without Prostat; this will provide 1728 kcal, 108 grams of protein, and 1168 mL free water -Free Water per MD/NP with pt on CVVHD (currently receiving NS @ 49mL/hr) -Continue PEPuP protocol  NUTRITION DIAGNOSIS:   Inadequate oral intake related to inability to eat as evidenced by NPO status. -ongoing  GOAL:   Patient will meet greater than or equal to 90% of their needs -Meeting with TF  MONITOR:   Vent status, TF tolerance, Weight trends, Labs, Skin, I & O's  REASON FOR ASSESSMENT:   Ventilator, Malnutrition Screening Tool, Consult Enteral/tube feeding initiation and management  ASSESSMENT:   75 year old male with PMH as below which is significant for Diastolic CHF, CKD, COPD, PAF (s/p recent ablation), and rheumatoid arthritis (recently on remicade, now on chronic pred 5mg ). He has several recent hospitalizations. Most recently he was admitted 7/15 for presumed HCAP. He was streated with 9 days of HCAP antibiotics and was transferred to [facility] 7/22. 7/24 he was noted to have AMS to the point where he was essentially comatose. O2 sats noted to be about 60% on room air at that time. He was transported to [facility] ED for further evaluation where he was placed on BiPAP with some improvement. CXR was performed which is consistent with interstitial lung changes noted on very recent CT.  7/31  Patient is currently intubated on ventilator support MV: 8 L/min Temp (24hrs), Avg:98.1 F (36.7 C), Min:97.5 F (36.4 C), Max:98.8 F (37.1 C)  Propofol: none  Pt has been  weaned from pressors, weaning from vent some. CRRT continues, fluid stable - CVP 10-12. Tolerating TF @ 60 Receiving 2248cc free water at this time with NS @ 11mL/hr and TF @ 60. - Still less than 100cc urine over last 24 hours.  Labs and Medications  reviewed: Fentanyl drip, Solumedrol, Senokot, CBGs 198-252, BUN 33, Cr 1.55, Mg 2.5, Albumin 2.3,   7/28 Pt continues on CVVHD (day #4) with OGT in place. Currently receiving TF at goal rate per order: Vital 1.2 @ 55 mL/hr with 30 mL Prostat once/day which is providing 1684 kcal, 114 grams of protein, and 1071 mL free water.   Patient is currently intubated on ventilator support MV: 11.3 L/min Temp (24hrs), Avg:98.4 F (36.9 C), Min:97 F (36.1 C), Max:99 F (37.2 C) Propofol: none  Estimated kcal needs updated this AM; continue to use admission weight of 71.1 kg although weight is now beginning to drop closer to admission weight. Nephrology note indicates pt volume now WDL/no fluid overload. Notes also indicated possible STEMI overnight which has now been ruled-out.    Medications reviewed; sliding scale Novolog, 40 mg IV Solu-medrol every 12 hours.  Labs reviewed; CBGs: 273 and 290 mg/dL this AM, Na: 134 mmol/L, BUN: 27 mg/dL, creatinine: 1.42 mg/dL, Ca: 8.3 mg/dL, Mg: 2.5 mg/dL, GFR: 46 mL/min. Drips: Neo @ 33 mcg/min, Fentanyl @ 100 mcg/hr.    7/27 - Pt remains on CVVHD.  - OGT in place and pt receiving TF at goal rate: Vital 1.5 @ 45 mL/hr with 30 mL of Prostat TID which is providing 1920 kcal, 118 grams of protein, and 825 mL free water. - Estimated nutrition needs updated at this time based on current Ve and Tmax with admission weight (71.1 kg) used to calculate needs.  - Patient is currently intubated on ventilator support; MV: 10.2L/min; Temp (  24hrs), Avg:97.6 F Max:99 F (37.2 C); Propofol: none  Drips:Fentanyl @ 100 mcg/hr, Versed @ 1 mg/hr, Neo @ 50 mcg/min, Insulin @ 13.3 units/hr.  IVF:NS @ 75 mL/hr.     7/26 - Pt remains with OGT in place and currently receiving TF at goal rate: Nepro @ 40 mL/hr with 30 mL Prostat once/day and 50 mL free water every 6 hours; this is providing 1828 kcal, 93 grams of protein, and 898 mL of free water.  - HD catheter was  placed yesterday ~1130 and CRRT started yesterday.  - Estimated nutrition needs adjusted based on vent, CRRT. Admission weight of 71.1 kg used as weight is up since that time.  - Patient is currently intubated on ventilator support; MV: 15.4L/min; Temp (24hrs), Avg:96.7 F Max:99.3 F (37.4 C); Propofol: none - Will change TF order: Vital 1.5 @ 45 mL/hr with 30 mL Prostat TID. This regimen will provide 1920 kcal, 118 grams of protein, and 825 mL free water. - Free water, if desired, to be ordered by MD/NP given CRRT. - Pt meets criteria for malnutrition based on mild/moderate muscle wasting to upper body and 5.5% body weight loss in the past 1 month.   IVF: NS @ 75 mL/hr.  Drips:Fentanyl @ 200 mcg/hr, Versed @ 2 mg/hr, Neo @ 45 mcg/min, insulin @ 2.6 units/hr.    Diet Order:  Diet NPO time specified  Skin:  Wound (see comment) (L ear bruising)  Last BM:  PTA  Height:   Ht Readings from Last 1 Encounters:  01/05/16 5\' 8"  (1.727 m)    Weight:   Wt Readings from Last 1 Encounters:  01/10/16 158 lb 1.1 oz (71.7 kg)    Ideal Body Weight:  70 kg (kg)  BMI:  Body mass index is 24.03 kg/m.  Estimated Nutritional Needs:   Kcal:  [id number]  Protein:  107-121 grams (1.5-1.7 grams/kg)  Fluid:  1.6-1.8 L/day  EDUCATION NEEDS:   No education needs identified at this time  Satira Anis. Raveen Wieseler, MS, RD LDN Inpatient Clinical Dietitian Pager [phone]

## 2016-01-10 NOTE — Progress Notes (Signed)
Inpatient Diabetes Program Recommendations  AACE/ADA: New Consensus Statement on Inpatient Glycemic Control (2015)  Target Ranges:  Prepandial:   less than 140 mg/dL      Peak postprandial:   less than 180 mg/dL (1-2 hours)      Critically ill patients:  140 - 180 mg/dL   Lab Results  Component Value Date   GLUCAP 128 (H) 01/10/2016   HGBA1C 9.2 (H) 10/25/2015    Review of Glycemic Control  Blood sugars very labile. Pt would benefit from ICU Glycemic Control Protocol for improved blood sugars while on vent.  Inpatient Diabetes Program Recommendations:    ICU Glycemic Control Protocol - please order.  Will continue to follow. Thank you. Lorenda Peck, RD, LDN, CDE Inpatient Diabetes Coordinator [phone]

## 2016-01-10 NOTE — Progress Notes (Signed)
Pt placed back on wean with the vent, and seems to be tolerating well.

## 2016-01-10 NOTE — Progress Notes (Signed)
[facility] Progress Note Patient Name: Curtis Frazier DOB: 10/03/1940 MRN: [id number]   Date of Service  01/10/2016  HPI/Events of Note  RN though "rhythm" looks different so she got an EKG  eICU Interventions  EKG on 7/31 were reviewed and compared to EKGs on 7/30. All ekgs look similar. LBBB.   Troponin 0.04. Will observe for now.         [place] 01/10/2016, 2:40 AM

## 2016-01-10 NOTE — Progress Notes (Signed)
[facility] Progress Note   Subjective: Pressors have been weaned- attempting some vent weaning as well - CRRT running well   Vitals:   01/10/16 0918 01/10/16 0930 01/10/16 1000 01/10/16 1100  BP: (!) 102/57 (!) 123/56 (!) 119/56 (!) 124/49  Pulse: 99     Resp: 18 19 19  (!) 26  Temp:      TempSrc:      SpO2: 94%  97% 96%  Weight:      Height:        Inpatient medications: . anidulafungin  100 mg Intravenous Q24H  . antiseptic oral rinse  7 mL Mouth Rinse QID  . aspirin  81 mg Oral Daily  . chlorhexidine gluconate (SAGE KIT)  15 mL Mouth Rinse BID  . insulin aspart  0-15 Units Subcutaneous Q4H  . insulin NPH Human  15 Units Subcutaneous Q8H  . ipratropium-albuterol  3 mL Nebulization Q6H  . levofloxacin (LEVAQUIN) IV  500 mg Intravenous Q24H  . linezolid (ZYVOX) IV  600 mg Intravenous Q12H  . meropenem (MERREM) IV  1 g Intravenous Q12H  . [START ON 01/11/2016] methylPREDNISolone (SOLU-MEDROL) injection  40 mg Intravenous Daily  . nystatin  5 mL Oral TID  . nystatin   Topical TID  . pantoprazole sodium  40 mg Per Tube Q24H  . sennosides  5 mL Per Tube Q12H   . sodium chloride 30 mL/hr at 01/10/16 1116  . feeding supplement (VITAL AF 1.2 CAL) 1,000 mL (01/10/16 1100)  . fentaNYL infusion INTRAVENOUS 75 mcg/hr (01/10/16 1116)  . phenylephrine (NEO-SYNEPHRINE) Adult infusion Stopped (01/10/16 1116)  . dialysis replacement fluid (prismasate) 400 mL/hr at 01/10/16 1105  . dialysis replacement fluid (prismasate) 200 mL/hr at 01/09/16 2238  . dialysate (PRISMASATE) 2,000 mL/hr at 01/10/16 1106   sodium chloride, albuterol, alteplase, fentaNYL, heparin, ipratropium, midazolam  Exam: Gen on vent, unresponsive, sedated Sclera anicteric, throat w ETT-  Right IJ vascath placed 7/25 No jvd or bruits Chest some post crackles at bases RRR no MRG Abd soft nondistended no ascites or mass GU normal male w Foley Ext no LE or UE edema Neuro as above  CT chest >  underlying emphysema w bilat patchy infiltrates/ ground glass changes, LL > UL's CXR 7.25 > patchy bilat infiltrates UA - negative CVP 10-12  Assessment: 1  Acute on CRF3 - baseline creatinine in the high 1's- ischemic ATN due to shock most likely, CRRT D#6.  Less than 100 cc's of urine last 24 hours so not able to liberate from CRRT at this time- no heparin- all dialysate 2  Recurrent resp failure - ARDS + prob HCAP- per CCM- making some positive movements 3  Volume - CVP 113, stable wts, no vol excess- running even 4  COPD underlying 5  Hx RA/ psor arthriitis/ PMR - chron immunosuppressed for yrs (methotrexate, Remicade/ Humira, pred) 6. Acidosis- improved 7. Elytes- stable     Onnie Hatchel A  01/10/2016, 11:38 AM    Recent Labs Lab 01/09/16 0400 01/09/16 1551 01/10/16 0444  NA 135 136 138  K 4.7 4.1 4.5  CL 104 103 106  CO2 26 27 27   GLUCOSE 216* 191* 147*  BUN 27* 30* 33*  CREATININE 1.30* 1.24 1.55*  CALCIUM 8.3* 8.5* 8.6*  PHOS 4.1 3.7 4.1    Recent Labs Lab 01/03/16 1844  01/09/16 0400 01/09/16 1551 01/10/16 0444  AST 56*  --   --   --   --   ALT 40  --   --   --   --  ALKPHOS 127*  --   --   --   --   BILITOT 0.8  --   --   --   --   PROT 7.1  --   --   --   --   ALBUMIN 2.1*  < > 2.4* 2.5* 2.3*  < > = values in this interval not displayed.  Recent Labs Lab 01/08/16 0430 01/09/16 0400 01/10/16 0444  WBC 20.1* 22.1* 19.4*  NEUTROABS 18.5* 20.9* 18.1*  HGB 11.1* 11.2* 11.4*  HCT 37.4* 37.6* 37.9*  MCV 98.2 98.4 97.9  PLT 170 186 189   Iron/TIBC/Ferritin/ %Sat    Component Value Date/Time   IRON 24 (L) 08/13/2015 0430   TIBC 258 08/13/2015 0430   FERRITIN 147 08/13/2015 0430   IRONPCTSAT 9 (L) 08/13/2015 0430

## 2016-01-10 NOTE — Progress Notes (Signed)
INFECTIOUS DISEASE PROGRESS NOTE  ID: Curtis Frazier is a 75 y.o. male with  Active Problems:   Acute respiratory failure with hypoxemia ([place])   Visit for intubation   ARDS (adult respiratory distress syndrome) (HCC)   Malnutrition of moderate degree   abtx say #8: linezolid #7/meropenem #7/anidula #5 Subjective: Place back on vent this morning after brief vent wean. Remains afebrile  Abtx:  Anti-infectives    Start     Dose/Rate Route Frequency Ordered Stop   01/07/16 1700  anidulafungin (ERAXIS) 100 mg in sodium chloride 0.9 % 100 mL IVPB     100 mg over 90 Minutes Intravenous Every 24 hours 01/06/16 1537     01/06/16 1630  anidulafungin (ERAXIS) 200 mg in sodium chloride 0.9 % 200 mL IVPB     200 mg over 180 Minutes Intravenous  Once 01/06/16 1535 01/06/16 1954   01/05/16 2200  meropenem (MERREM) 1 g in sodium chloride 0.9 % 100 mL IVPB     1 g 200 mL/hr over 30 Minutes Intravenous Every 12 hours 01/05/16 1023     01/05/16 1030  meropenem (MERREM) 500 mg in sodium chloride 0.9 % 50 mL IVPB     500 mg 100 mL/hr over 30 Minutes Intravenous STAT 01/05/16 1023 01/05/16 1125   01/05/16 0100  meropenem (MERREM) 500 mg in sodium chloride 0.9 % 50 mL IVPB  Status:  Discontinued     500 mg 100 mL/hr over 30 Minutes Intravenous Every 8 hours 01/04/16 1620 01/05/16 1023   01/04/16 2200  linezolid (ZYVOX) IVPB 600 mg     600 mg 300 mL/hr over 60 Minutes Intravenous Every 12 hours 01/04/16 1555     01/04/16 2000  vancomycin (VANCOCIN) IVPB 1000 mg/200 mL premix  Status:  Discontinued     1,000 mg 200 mL/hr over 60 Minutes Intravenous Every 24 hours 01/03/16 2016 01/04/16 0730   01/04/16 2000  vancomycin (VANCOCIN) IVPB 750 mg/150 ml premix  Status:  Discontinued     750 mg 150 mL/hr over 60 Minutes Intravenous Every 24 hours 01/04/16 0730 01/04/16 1555   01/04/16 1800  imipenem-cilastatin (PRIMAXIN) 500 mg in sodium chloride 0.9 % 100 mL IVPB  Status:  Discontinued     500 mg 200  mL/hr over 30 Minutes Intravenous Every 12 hours 01/04/16 0730 01/04/16 1555   01/04/16 1700  levofloxacin (LEVAQUIN) IVPB 500 mg     500 mg 100 mL/hr over 60 Minutes Intravenous Every 24 hours 01/04/16 1555     01/04/16 1700  meropenem (MERREM) 1 g in sodium chloride 0.9 % 100 mL IVPB     1 g 200 mL/hr over 30 Minutes Intravenous  Once 01/04/16 1619 01/04/16 1712   01/04/16 0400  imipenem-cilastatin (PRIMAXIN) 500 mg in sodium chloride 0.9 % 100 mL IVPB  Status:  Discontinued     500 mg 200 mL/hr over 30 Minutes Intravenous Every 8 hours 01/03/16 2016 01/04/16 0730   01/03/16 2000  imipenem-cilastatin (PRIMAXIN) 500 mg in sodium chloride 0.9 % 100 mL IVPB     500 mg 200 mL/hr over 30 Minutes Intravenous STAT 01/03/16 1953 01/03/16 2114   01/03/16 1830  vancomycin (VANCOCIN) IVPB 1000 mg/200 mL premix     1,000 mg 200 mL/hr over 60 Minutes Intravenous  Once 01/03/16 1819 01/03/16 2043      Medications:  Scheduled: . anidulafungin  100 mg Intravenous Q24H  . antiseptic oral rinse  7 mL Mouth Rinse QID  . aspirin  81 mg Oral Daily  . bisacodyl  10 mg Rectal Once  . chlorhexidine gluconate (SAGE KIT)  15 mL Mouth Rinse BID  . famotidine (PEPCID) IV  20 mg Intravenous Q24H  . insulin aspart  0-15 Units Subcutaneous Q4H  . insulin NPH Human  15 Units Subcutaneous Q8H  . ipratropium-albuterol  3 mL Nebulization Q6H  . levofloxacin (LEVAQUIN) IV  500 mg Intravenous Q24H  . linezolid (ZYVOX) IV  600 mg Intravenous Q12H  . meropenem (MERREM) IV  1 g Intravenous Q12H  . [START ON 01/11/2016] methylPREDNISolone (SOLU-MEDROL) injection  40 mg Intravenous Daily  . nystatin  5 mL Oral TID  . nystatin   Topical TID  . sennosides  5 mL Per Tube Q12H    Objective: Vital signs in last 24 hours: Temp:  [97.5 F (36.4 C)-98.8 F (37.1 C)] 98.6 F (37 C) (07/31 0700) Pulse Rate:  [72-99] 99 (07/31 0918) Resp:  [10-23] 19 (07/31 1000) BP: (72-158)/(45-71) 119/56 (07/31 1000) SpO2:  [87 %-100  %] 97 % (07/31 1000) FiO2 (%):  [40 %] 40 % (07/31 1000) Weight:  [71.7 kg (158 lb 1.1 oz)] 71.7 kg (158 lb 1.1 oz) (07/31 0500) Physical Exam  Constitutional: He is intubated/sedated. He appears well-developed and well-nourished. No distress.  HENT: OETT in place Cardiovascular: Normal rate, regular rhythm and normal heart sounds. Exam reveals no gallop and no friction rub.  No murmur heard.  Pulmonary/Chest: Effort normal and breath sounds normal. No respiratory distress. He has no wheezes.  Abdominal: distended. Decreased bowel sounds Skin: Skin is warm and dry. No rash noted. No erythema.  Neuro = spontaneously moving upper extremities   Lab Results  Recent Labs  01/09/16 0400 01/09/16 1551 01/10/16 0444  WBC 22.1*  --  19.4*  HGB 11.2*  --  11.4*  HCT 37.6*  --  37.9*  NA 135 136 138  K 4.7 4.1 4.5  CL 104 103 106  CO2 26 27 27   BUN 27* 30* 33*  CREATININE 1.30* 1.24 1.55*   Liver Panel  Recent Labs  01/09/16 1551 01/10/16 0444  ALBUMIN 2.5* 2.3*   Microbiology: Lurline Idol Cx "resp flora" (unfortunately his BAL did not make it to lab) BCx 1/2 CNS (contaminant), UCx negative. Resp virus panel (-) ur leg (-) CXR unchanged Recent Results (from the past 240 hour(s))  MRSA PCR Screening     Status: None   Collection Time: 01/03/16  5:54 PM  Result Value Ref Range Status   MRSA by PCR NEGATIVE NEGATIVE Final    Comment:        The GeneXpert MRSA Assay (FDA approved for NASAL specimens only), is one component of a comprehensive MRSA colonization surveillance program. It is not intended to diagnose MRSA infection nor to guide or monitor treatment for MRSA infections.   Blood Culture (routine x 2)     Status: None   Collection Time: 01/03/16  6:44 PM  Result Value Ref Range Status   Specimen Description BLOOD LEFT ANTECUBITAL  Final   Special Requests BOTTLES DRAWN AEROBIC AND ANAEROBIC 5CC  Final   Culture   Final    NO GROWTH 5 DAYS Performed at [facility]    Report Status 01/08/2016 FINAL  Final  Blood Culture (routine x 2)     Status: Abnormal   Collection Time: 01/03/16  6:50 PM  Result Value Ref Range Status   Specimen Description BLOOD LEFT HAND  Final   Special Requests BOTTLES  DRAWN AEROBIC AND ANAEROBIC 5CC  Final   Culture  Setup Time   Final    GRAM POSITIVE COCCI IN CLUSTERS AEROBIC BOTTLE ONLY CRITICAL RESULT CALLED TO, READ BACK BY AND VERIFIED WITH: E. JACKSON, [facility] AT [place] ON 01/04/16 BY C. JESSUP, MLT.    Culture (A)  Final    STAPHYLOCOCCUS SPECIES (COAGULASE NEGATIVE) THE SIGNIFICANCE OF ISOLATING THIS ORGANISM FROM A SINGLE SET OF BLOOD CULTURES WHEN MULTIPLE SETS ARE DRAWN IS UNCERTAIN. PLEASE NOTIFY THE MICROBIOLOGY DEPARTMENT WITHIN ONE WEEK IF SPECIATION AND SENSITIVITIES ARE REQUIRED. Performed at [facility]    Report Status 01/05/2016 FINAL  Final  Blood Culture ID Panel (Reflexed)     Status: Abnormal   Collection Time: 01/03/16  6:50 PM  Result Value Ref Range Status   Enterococcus species NOT DETECTED NOT DETECTED Final   Vancomycin resistance NOT DETECTED NOT DETECTED Final   Listeria monocytogenes NOT DETECTED NOT DETECTED Final   Staphylococcus species DETECTED (A) NOT DETECTED Final    Comment: CRITICAL RESULT CALLED TO, READ BACK BY AND VERIFIED WITH: [place], [facility] AT 1840 ON 01/04/16 BY C. JESSUP, MLT.    Staphylococcus aureus NOT DETECTED NOT DETECTED Final   Methicillin resistance NOT DETECTED NOT DETECTED Final   Streptococcus species NOT DETECTED NOT DETECTED Final   Streptococcus agalactiae NOT DETECTED NOT DETECTED Final   Streptococcus pneumoniae NOT DETECTED NOT DETECTED Final   Streptococcus pyogenes NOT DETECTED NOT DETECTED Final   Acinetobacter baumannii NOT DETECTED NOT DETECTED Final   Enterobacteriaceae species NOT DETECTED NOT DETECTED Final   Enterobacter cloacae complex NOT DETECTED NOT DETECTED Final   Escherichia coli NOT DETECTED NOT DETECTED Final    Klebsiella oxytoca NOT DETECTED NOT DETECTED Final   Klebsiella pneumoniae NOT DETECTED NOT DETECTED Final   Proteus species NOT DETECTED NOT DETECTED Final   Serratia marcescens NOT DETECTED NOT DETECTED Final   Carbapenem resistance NOT DETECTED NOT DETECTED Final   Haemophilus influenzae NOT DETECTED NOT DETECTED Final   Neisseria meningitidis NOT DETECTED NOT DETECTED Final   Pseudomonas aeruginosa NOT DETECTED NOT DETECTED Final   Candida albicans NOT DETECTED NOT DETECTED Final   Candida glabrata NOT DETECTED NOT DETECTED Final   Candida krusei NOT DETECTED NOT DETECTED Final   Candida parapsilosis NOT DETECTED NOT DETECTED Final   Candida tropicalis NOT DETECTED NOT DETECTED Final    Comment: Performed at [facility]  Culture, respiratory (NON-Expectorated)     Status: None   Collection Time: 01/03/16  7:52 PM  Result Value Ref Range Status   Specimen Description THROAT  Final   Special Requests NONE  Final   Gram Stain   Final    ABUNDANT WBC PRESENT,BOTH PMN AND MONONUCLEAR RARE GRAM POSITIVE COCCI IN PAIRS RARE GRAM VARIABLE ROD RARE YEAST    Culture   Final    Consistent with normal respiratory flora. Performed at [facility]    Report Status 01/06/2016 FINAL  Final  Culture, blood (routine x 2)     Status: None   Collection Time: 01/03/16  8:45 PM  Result Value Ref Range Status   Specimen Description BLOOD RIGHT ANTECUBITAL  Final   Special Requests BOTTLES DRAWN AEROBIC AND ANAEROBIC 5CC  Final   Culture   Final    NO GROWTH 5 DAYS Performed at [facility]    Report Status 01/08/2016 FINAL  Final  Culture, blood (routine x 2)     Status: None  Collection Time: 01/03/16  8:49 PM  Result Value Ref Range Status   Specimen Description BLOOD RIGHT HAND  Final   Special Requests BOTTLES DRAWN AEROBIC ONLY 5CC  Final   Culture   Final    NO GROWTH 5 DAYS Performed at [facility]    Report Status 01/08/2016 FINAL  Final  Urine  culture     Status: None   Collection Time: 01/03/16 11:15 PM  Result Value Ref Range Status   Specimen Description URINE, CATHETERIZED  Final   Special Requests NONE  Final   Culture NO GROWTH Performed at [facility]   Final   Report Status 01/05/2016 FINAL  Final  Respiratory Panel by PCR     Status: None   Collection Time: 01/03/16 11:20 PM  Result Value Ref Range Status   Adenovirus NOT DETECTED NOT DETECTED Final   Coronavirus 229E NOT DETECTED NOT DETECTED Final   Coronavirus HKU1 NOT DETECTED NOT DETECTED Final   Coronavirus NL63 NOT DETECTED NOT DETECTED Final   Coronavirus OC43 NOT DETECTED NOT DETECTED Final   Metapneumovirus NOT DETECTED NOT DETECTED Final   Rhinovirus / Enterovirus NOT DETECTED NOT DETECTED Final   Influenza A NOT DETECTED NOT DETECTED Final   Influenza A H1 NOT DETECTED NOT DETECTED Final   Influenza A H1 2009 NOT DETECTED NOT DETECTED Final   Influenza A H3 NOT DETECTED NOT DETECTED Final   Influenza B NOT DETECTED NOT DETECTED Final   Parainfluenza Virus 1 NOT DETECTED NOT DETECTED Final   Parainfluenza Virus 2 NOT DETECTED NOT DETECTED Final   Parainfluenza Virus 3 NOT DETECTED NOT DETECTED Final   Parainfluenza Virus 4 NOT DETECTED NOT DETECTED Final   Respiratory Syncytial Virus NOT DETECTED NOT DETECTED Final   Bordetella pertussis NOT DETECTED NOT DETECTED Final   Chlamydophila pneumoniae NOT DETECTED NOT DETECTED Final   Mycoplasma pneumoniae NOT DETECTED NOT DETECTED Final    Comment: Performed at [facility]  Culture, respiratory (NON-Expectorated)     Status: None   Collection Time: 01/06/16  9:45 AM  Result Value Ref Range Status   Specimen Description TRACHEAL ASPIRATE  Final   Special Requests Normal  Final   Gram Stain   Final    NO WBC SEEN RARE GRAM POSITIVE COCCI IN PAIRS AND CHAINS RARE YEAST RARE SQUAMOUS EPITHELIAL CELLS PRESENT    Culture   Final    Consistent with normal respiratory flora. Performed at  [facility]    Report Status 01/08/2016 FINAL  Final  Pneumocystis smear by DFA     Status: None   Collection Time: 01/06/16 10:04 AM  Result Value Ref Range Status   Specimen Source-PJSRC TRACHEAL ASPIRATE  Final   Pneumocystis jiroveci Ag NEGATIVE  Final    Comment: Performed at [facility]  Culture, blood (Routine X 2) w Reflex to ID Panel     Status: None (Preliminary result)   Collection Time: 01/06/16 11:37 AM  Result Value Ref Range Status   Specimen Description BLOOD RIGHT HAND  Final   Special Requests BOTTLES DRAWN AEROBIC AND ANAEROBIC  5CC  Final   Culture   Final    NO GROWTH 3 DAYS Performed at [facility]    Report Status PENDING  Incomplete  Culture, blood (Routine X 2) w Reflex to ID Panel     Status: None (Preliminary result)   Collection Time: 01/06/16 11:44 AM  Result Value Ref Range Status  Specimen Description BLOOD LEFT HAND  Final   Special Requests BOTTLES DRAWN AEROBIC ONLY [place]  Final   Culture   Final    NO GROWTH 3 DAYS Performed at [facility]    Report Status PENDING  Incomplete    Studies/Results: Dg Chest Port 1 View  Result Date: 01/10/2016 CLINICAL DATA:  ARDS EXAM: PORTABLE CHEST 1 VIEW COMPARISON:  01/08/2016 FINDINGS: Support devices are stable. Diffuse interstitial and alveolar opacities within the lungs, not significantly changed. Mild cardiomegaly. Suspect small left effusion. The right effusion. IMPRESSION: Continued diffuse interstitial and alveolar opacities, most confluent at the left base. No change. Small left effusion. Electronically Signed   By: Rolm Baptise M.D.   On: 01/10/2016 09:45  Dg Chest Port 1 View  Result Date: 01/08/2016 CLINICAL DATA:  Repositioning of endotracheal tube. EXAM: PORTABLE CHEST 1 VIEW COMPARISON:  One-view chest from the same day. FINDINGS: The endotracheal tube has been pulled back and a terminates 4.3 cm above the carina, in satisfactory position. A right IJ  sheath is stable. A left subclavian line is stable. NG tube courses off the inferior border the film. The heart is enlarged. Diffuse interstitial and airspace disease is improving. NG airspace disease remains worse on the left. IMPRESSION: 1. Satisfactory positioning of the endotracheal tube which now terminates 4.3 cm above the carina. 2. Support apparatus is otherwise stable. 3. Improving diffuse interstitial and airspace disease. Airspace component remains greatest on the left. Electronically Signed   By: San Morelle M.D.   On: 01/08/2016 12:18    Assessment/Plan: HCAP   Clinical pictures suggestive of ARDS, possibly multifactorial with infectious cause +/- ILD risk with RA Continue with broad coverage with linezolid, meropenem ,and anidulafungin due to yeast on trach aspirate. Attempting first day of vent wean trial per pulmonary team.  Leukocytosis = Steroids causing increased WBC. Continue to monitor  Acute on CKD = continue on CVVH   RA  = continue on solumedrol          Lashaye Fisk Infectious Diseases (pager) [phone] [email or web address] 01/10/2016, 10:33 AM  LOS: 7 days

## 2016-01-10 NOTE — Progress Notes (Signed)
PULMONARY / CRITICAL CARE MEDICINE   Name: Curtis Frazier MRN: [id number] DOB: 1940/09/30    ADMISSION DATE:  01/03/2016 CONSULTATION DATE:  01/03/2016  REFERRING MD:  Dr. Dayna Barker EDP  CHIEF COMPLAINT:  SOB/AMS  BRIEF: 75 y/o male with a rheumatoid arthritis and a recent admission for acute respiratory failure with hypoxemia presumably due to HCAP was re-admitted to [facility] on 7/24 with recurrent acute on chronic hypoxemic respiratory failure with a picture consistent with ARDS.   SUBJECTIVE:  Remains on CVVHD with even goal, neosynephrine at 30 mcg, fentanyl at 200 mcg, did not tolerate precedex (? PVC's), weaning on PSV, no follow commands but opens eyes.  CVP 12.  No BM yet despite bowel regimen.     VITAL SIGNS: BP (!) 103/57   Pulse 97   Temp 98.6 F (37 C) (Core (Comment)) Comment (Src): foley temp  Resp (!) 23   Ht 5\' 8"  (1.727 m)   Wt 158 lb 1.1 oz (71.7 kg)   SpO2 95%   BMI 24.03 kg/m   HEMODYNAMICS: CVP:  [10 mmHg-14 mmHg] 12 mmHg  VENTILATOR SETTINGS: Vent Mode: CPAP;PSV FiO2 (%):  [40 %] 40 % Set Rate:  [18 bmp] 18 bmp Vt Set:  [500 mL] 500 mL PEEP:  [8 cmH20] 8 cmH20 Pressure Support:  [7 cmH20] 7 cmH20 Plateau Pressure:  [16 cmH20-20 cmH20] 20 cmH20  INTAKE / OUTPUT: I/O last 3 completed shifts: In: 7955.8 [I.V.:4016.8; Other:[id number]; IV Piggyback:1530] Out: Shannen.Canner [Urine:73; Other:7962]  PHYSICAL EXAMINATION: General:  Sedated. No family at bedside. No distress.  Integument:  Warm & dry. Mild erythematous rash on scrotum. HEENT: Dry mucous membranes. No scleral icterus. Endotracheal tube in place. MM pink/dry, small area of erythema on right ear Cardiovascular:  Regular rate. Trace edema. No appreciable JVD with body positioning.  Pulmonary:  Coarse breath sounds bilaterally. Symmetric chest wall rise on ventilator. Abdomen: Soft. Hypoactive bowel sounds unchanged. Protuberant.  Musculoskeletal:  Normal bulk. No joint deformity or effusion  appreciated. Neurological: Sedated, Pupils symmetric. No response to voice  LABS:  BMET  Recent Labs Lab 01/09/16 0400 01/09/16 1551 01/10/16 0444  NA 135 136 138  K 4.7 4.1 4.5  CL 104 103 106  CO2 26 27 27   BUN 27* 30* 33*  CREATININE 1.30* 1.24 1.55*  GLUCOSE 216* 191* 147*    Electrolytes  Recent Labs Lab 01/08/16 0430  01/09/16 0400 01/09/16 1551 01/10/16 0444  CALCIUM 8.2*  < > 8.3* 8.5* 8.6*  MG 2.4  --  2.4  --  2.5*  PHOS 3.6  < > 4.1 3.7 4.1  < > = values in this interval not displayed.  CBC  Recent Labs Lab 01/08/16 0430 01/09/16 0400 01/10/16 0444  WBC 20.1* 22.1* 19.4*  HGB 11.1* 11.2* 11.4*  HCT 37.4* 37.6* 37.9*  PLT 170 186 189    Coag's  Recent Labs Lab 01/08/16 0430 01/09/16 0400 01/10/16 0444  APTT 49* 44* 41*  INR 2.84 2.53 2.29    Sepsis Markers  Recent Labs Lab 01/03/16 1905  LATICACIDVEN 1.19    ABG  Recent Labs Lab 01/07/16 1155 01/08/16 0336 01/09/16 0336  PHART 7.292* 7.249* 7.236*  PCO2ART 52.2* 61.0* 62.9*  PO2ART 64.8* 71.7* 73.8*    Liver Enzymes  Recent Labs Lab 01/03/16 1844  01/09/16 0400 01/09/16 1551 01/10/16 0444  AST 56*  --   --   --   --   ALT 40  --   --   --   --  ALKPHOS 127*  --   --   --   --   BILITOT 0.8  --   --   --   --   ALBUMIN 2.1*  < > 2.4* 2.5* 2.3*  < > = values in this interval not displayed.  Cardiac Enzymes  Recent Labs Lab 01/09/16 1210 01/09/16 1800 01/10/16 0022  TROPONINI 0.03* 0.03* 0.04*    Glucose  Recent Labs Lab 01/09/16 0846 01/09/16 1201 01/09/16 1630 01/09/16 1934 01/10/16 0007 01/10/16 0747  GLUCAP 160* 309* 196* 211* 252* 128*    Imaging No results found.   STUDIES:  CTA Chest 7/15: No CT evidence for acute pulmonary embolus. Interlobular septal thickening with bilateral ground-glass attenuation. Imaging features are compatible with pulmonary edema although superimposed infectious/inflammatory etiology is not excluded.  Bibasilar collapse/ consolidation. Interval progression of mediastinal and right hilar lymphadenopathy, nonspecific.  TTE 7/26: Mild concentric LVH. EF 55-60%. Grade 1 diastolic dysfunction. LA severely dilated & RA normal in size. RV normal in size and function. Pulmonary artery systolic pressure 40 mmHg. Moderate aortic stenosis without regurgitation. Aortic root normal in size. No mitral stenosis with mild regurgitation. No pulmonic stenosis. Moderate tricuspid regurgitation. No pericardial effusion. Port CXR 7/29:  ETT entering right main stem. Enteric feeding tube appears to be in the stomach. Right and left central venous catheters in place. Port CXR 7/29: Endotracheal tube in good position. EKG 7/30:  Sinus rhythm. Lateral ST segment elevation w/ QTc 330ms.  MICROBIOLOGY: MRSA PCR 7/24:  Negative  Respiratory Viral Panel PCR 7/15:  Negative  Blood Ctx x4 7/24: 1/4 Coag Neg Staph Urine Ctx 7/24:  Negative  Tracheal Asp Ctx 7/24:  Oral [place]ora Respiratory Viral Panel PCR 7/24:  Negative  PCP 7/24 > not sent Tracheal Asp Ctx 7/27: Normal respiratory flora PJP Trach Asp 7/27:  Negative  Blood Ctx x2 7/27 >>  ANTIBIOTICS: Aztreonam 7/24 >7/25 Imipenem 7/24 - 7/25 Vancomycin 7/24 > 7/25 Levaquin 7/25 >> Meropenem 7/25 >> Linezolid 7/25 >>  Anidulofungin 7/27 >>   SIGNIFICANT EVENTS: 7/22 - discharge to SNF 7/24 - Admit, intubated for respiratory failure  7/25 - worsening acidosis, off pressors, ARDS protocol, CVVHD initiated 7/26 - Nimbex discontinued  7/30 - hypotension with sedation, vasopressors restarted, ST segment changes   LINES/TUBES: OETT 8.5 7/24 >> OGT 7/24 >> L Subclavian CVL 7/25 >>  R IJ HD CVL 7/25 >> Foley 7/25 >> PIV x1  DISCUSSION: 75 year old male with baseline RA and COPD and a recent admission for acute respiratory failure with hypoxemia was admitted on 7/24 with ARDS most likely due to HCAP but there has been question of underlying ILD related to his  rheumatoid arthritis.  Has multi-organ failure.  Off ARDS protocol, CVVH on going.    ASSESSMENT / PLAN:  PULMONARY A: Acute Hypoxic Respiratory Failure - Improving. Secondary to ARDS/HCAP. ARDS - Switched off ARDS protocol 7/29. HCAP vs Autoimmune Process H/O COPD/Emphysema Mediastinal Lymphadenopathy H/O Sleep Apnea  P:   PRVC Vt 500 / 18 / 8 / 40% Wean FiO2 for Sat >94% SBT/WUA daily Reduce Solu-Medrol IV to 40 mg QD Duoneb q6hr  INFECTIOUS A:   HCAP - Cultures negative but BAL lost from admission. Possible Bacteremia - Staph likely contaminate. Repeat cultures on 7/27 pending. Scrotal Rash - suspect yeast/fungus.  P:   ID Following & guiding antibiotics Levaquin Day #6; Meropenem Day #7; Linezolid Day #7, & Eraxis Day #5 Awaiting finalization of blood cultures Nystatin Powder to groin &  Nystatin S&S oral   CARDIOVASCULAR A:  Abnormal EKG - ST elevation RCA distribution, resolved spontaneously without hemodynamic change, uncertain etiology. Seen on 7/28 & again 7/30. Suspect demand with hypotension in setting of moderate aortic stenosis. Shock - Likely sedation & CVVHD related.  H/O CAD H/O Carotid Artery Disease H/O Chronic Diastolic CHF H/O HTN H/O A fib S/P Ablation H/O Hyperlipidemia H/O Moderate Aortic Stenosis  P:  Continuous Telemetry Monitoring Vitals per unit protocol Neo-Synephrine to maintain MAP >65 ASA VT 81mg  daily Trending Troponin I  - INR remains elevated, previously on coumadin  Consider Statin if bowels move  RENAL A:   Acute on Chronic Renal Failure Stage 3 - Oliguric to anuric.  Metabolic Acidosis - Resolved.  P:   Nephrology Following & guiding dialysis Continuing CVVHD Goal Net Even Volume Status Trending electrolytes q12hr Replace electrolytes as indicated  GASTROINTESTINAL A:   Constipation H/O GERD & Gastritis H/O Hemorrhoids H/O Colon Polyps  P:   NPO Continue Tube Feeds Pepcid IV q24hr Senna bid  VT  HEMATOLOGIC A:   Coagulopathy - Secondary to Coumadin. Leukocytosis - Likely multifactorial. Unchanged. Anemia - No signs of active bleeding. Mild & Hgb stable.  P:  Trending cell counts daily w/ CBC Daily INR Transfuse for active bleeding of Hgb <8.0  ENDOCRINE A:   Chronic Prednisone - For RA/PMR. H/O DM Type 2 - Hyperglycemia improved but still significant.  P:   Continue IV Solu-Medrol daily, reduce to 40 mg QD NPH 15u Harrison q8hr at noon (home dose Levemir 15u daily) Continue Accu-Checks q4hr  Continue Moderate Dose SSI per algorithm  NEUROLOGIC A:   Acute Encephalopathy - Multifactorial. Likely combination of metabolic & hypoxemia. Sedation on Ventilator   P:   RASS Goal: 0 to -1 Fentanyl gtt & IV for Pain Versed IV prn Sedation  FAMILY  - Updates: Son updated by Dr. Ashok Cordia 7/29.  No family at bedside 7/31.    [name], NP-C [facility] Pgr: [phone] or if no answer [phone] 01/10/2016, 8:59 AM   Tolerating pressure support.  Wean pressors.  Remains on CRRT.  RASS -2.  Scattered rhonchi.  HR regular.  Abd soft.  No edema.  CXR b/l interstitial infiltrates  WBC 19.4, Creatinine 1.55  Assessment/plan: Acute hypoxic respiratory failure. Hx of COPD/emphysema. Hx OSA. - pressure support >> might need trach - continue BDs - f/u CXR  HCAP. Scrotal rash with yeast. - continue Abx, antifungals per ID  Hypotension. - wean off pressors to keep MAP > 65  AKI with hx of CKD 3. 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 - wean steroids as tolerated  Updated pt's daughter at bedside.  CC time by me independent of APP time is 32 minutes.  Chesley Mires, MD [facility] Pulmonary/Critical Care 01/10/2016, 11:12 AM Pager:  [phone] After 3pm call: [phone]

## 2016-01-11 ENCOUNTER — Inpatient Hospital Stay (HOSPITAL_COMMUNITY): Payer: Medicare Other

## 2016-01-11 DIAGNOSIS — H6191 Disorder of right external ear, unspecified: Secondary | ICD-10-CM

## 2016-01-11 DIAGNOSIS — K567 Ileus, unspecified: Secondary | ICD-10-CM

## 2016-01-11 LAB — CULTURE, BLOOD (ROUTINE X 2)
CULTURE: NO GROWTH
Culture: NO GROWTH

## 2016-01-11 LAB — RENAL FUNCTION PANEL
ANION GAP: 6 (ref 5–15)
Albumin: 2.3 g/dL — ABNORMAL LOW (ref 3.5–5.0)
Albumin: 2.1 g/dL — ABNORMAL LOW (ref 3.5–5.0)
Anion gap: 5 (ref 5–15)
BUN: 35 mg/dL — ABNORMAL HIGH (ref 6–20)
BUN: 27 mg/dL — AB (ref 6–20)
CALCIUM: 8.4 mg/dL — AB (ref 8.9–10.3)
CHLORIDE: 106 mmol/L (ref 101–111)
CO2: 27 mmol/L (ref 22–32)
CO2: 27 mmol/L (ref 22–32)
CREATININE: 1.45 mg/dL — AB (ref 0.61–1.24)
Calcium: 8.1 mg/dL — ABNORMAL LOW (ref 8.9–10.3)
Chloride: 103 mmol/L (ref 101–111)
Creatinine, Ser: 1.47 mg/dL — ABNORMAL HIGH (ref 0.61–1.24)
GFR calc Af Amer: 52 mL/min — ABNORMAL LOW (ref >60)
GFR calc non Af Amer: 45 mL/min — ABNORMAL LOW (ref >60)
GFR calc non Af Amer: 45 mL/min — ABNORMAL LOW (ref >60)
GFR, EST AFRICAN AMERICAN: 52 mL/min — AB (ref >60)
GLUCOSE: 80 mg/dL (ref 65–99)
Glucose, Bld: 59 mg/dL — ABNORMAL LOW (ref 65–99)
POTASSIUM: 4.1 mmol/L (ref 3.5–5.1)
Phosphorus: 3.7 mg/dL (ref 2.5–4.6)
Phosphorus: 2.9 mg/dL (ref 2.5–4.6)
Potassium: 4.2 mmol/L (ref 3.5–5.1)
SODIUM: 136 mmol/L (ref 135–145)
SODIUM: 138 mmol/L (ref 135–145)

## 2016-01-11 LAB — CBC WITH DIFFERENTIAL/PLATELET
BASOS ABS: 0 10*3/uL (ref 0.0–0.1)
BASOS PCT: 0 %
EOS ABS: 0 10*3/uL (ref 0.0–0.7)
Eosinophils Relative: 0 %
HCT: 36.7 % — ABNORMAL LOW (ref 39.0–52.0)
HEMOGLOBIN: 11.1 g/dL — AB (ref 13.0–17.0)
Lymphocytes Relative: 4 %
Lymphs Abs: 0.8 10*3/uL (ref 0.7–4.0)
MCH: 29.5 pg (ref 26.0–34.0)
MCHC: 30.2 g/dL (ref 30.0–36.0)
MCV: 97.6 fL (ref 78.0–100.0)
MONO ABS: 2.1 10*3/uL — AB (ref 0.1–1.0)
MONOS PCT: 10 %
NEUTROS PCT: 86 %
Neutro Abs: 17.8 10*3/uL — ABNORMAL HIGH (ref 1.7–7.7)
Platelets: 202 10*3/uL (ref 150–400)
RBC: 3.76 MIL/uL — ABNORMAL LOW (ref 4.22–5.81)
RDW: 17.6 % — AB (ref 11.5–15.5)
WBC: 20.7 10*3/uL — ABNORMAL HIGH (ref 4.0–10.5)

## 2016-01-11 LAB — GLUCOSE, CAPILLARY
GLUCOSE-CAPILLARY: 138 mg/dL — AB (ref 65–99)
GLUCOSE-CAPILLARY: 57 mg/dL — AB (ref 65–99)
GLUCOSE-CAPILLARY: 133 mg/dL — AB (ref 65–99)
GLUCOSE-CAPILLARY: 70 mg/dL (ref 65–99)
GLUCOSE-CAPILLARY: 81 mg/dL (ref 65–99)
GLUCOSE-CAPILLARY: 58 mg/dL — AB (ref 65–99)
GLUCOSE-CAPILLARY: 74 mg/dL (ref 65–99)
GLUCOSE-CAPILLARY: 81 mg/dL (ref 65–99)
Glucose-Capillary: 312 mg/dL — ABNORMAL HIGH (ref 65–99)
Glucose-Capillary: 370 mg/dL — ABNORMAL HIGH (ref 65–99)
Glucose-Capillary: 61 mg/dL — ABNORMAL LOW (ref 65–99)
Glucose-Capillary: 62 mg/dL — ABNORMAL LOW (ref 65–99)
Glucose-Capillary: 70 mg/dL (ref 65–99)
Glucose-Capillary: 129 mg/dL — ABNORMAL HIGH (ref 65–99)
Glucose-Capillary: 71 mg/dL (ref 65–99)

## 2016-01-11 LAB — PROTIME-INR
INR: 1.87
PROTHROMBIN TIME: 21.8 s — AB (ref 11.4–15.2)

## 2016-01-11 LAB — APTT: APTT: 35 s (ref 24–36)

## 2016-01-11 LAB — OCCULT BLOOD GASTRIC / DUODENUM (SPECIMEN CUP)
OCCULT BLOOD, GASTRIC: POSITIVE — AB
pH, Gastric: UNDETERMINED

## 2016-01-11 LAB — MAGNESIUM: Magnesium: 2.6 mg/dL — ABNORMAL HIGH (ref 1.7–2.4)

## 2016-01-11 MED ORDER — DEXTROSE 50 % IV SOLN
INTRAVENOUS | Status: AC
  Administered 2016-01-11 (×2): 25 mL
  Filled 2016-01-11: qty 50

## 2016-01-11 MED ORDER — VITAL AF 1.2 CAL PO LIQD
1000.0000 mL | ORAL | Status: DC
  Filled 2016-01-11: qty 1000

## 2016-01-11 MED ORDER — SODIUM CHLORIDE 0.9% FLUSH
10.0000 mL | Freq: Two times a day (BID) | INTRAVENOUS | Status: DC
  Administered 2016-01-11 – 2016-01-14 (×6): 10 mL
  Administered 2016-01-15: 30 mL
  Administered 2016-01-15 – 2016-01-17 (×4): 10 mL

## 2016-01-11 MED ORDER — SODIUM CHLORIDE 0.9% FLUSH: 10.0000 mL | INTRAVENOUS | Status: DC | PRN

## 2016-01-11 MED ORDER — HEPARIN SODIUM (PORCINE) 5000 UNIT/ML IJ SOLN
5000.0000 [IU] | Freq: Three times a day (TID) | INTRAMUSCULAR | Status: DC
  Administered 2016-01-11 – 2016-01-19 (×20): 5000 [IU] via SUBCUTANEOUS
  Filled 2016-01-11 (×20): qty 1

## 2016-01-11 MED ORDER — SODIUM CHLORIDE 0.9 % IV SOLN
INTRAVENOUS | Status: AC
  Administered 2016-01-11: 11:00:00 via INTRAVENOUS

## 2016-01-11 MED ORDER — DEXTROSE 50 % IV SOLN
INTRAVENOUS | Status: AC
  Administered 2016-01-11: 25 mL
  Filled 2016-01-11: qty 50

## 2016-01-11 MED ORDER — MAGNESIUM HYDROXIDE 400 MG/5ML PO SUSP
960.0000 mL | Freq: Once | ORAL | Status: AC
  Administered 2016-01-11: 960 mL via RECTAL
  Filled 2016-01-11: qty 240

## 2016-01-11 MED ORDER — FAMOTIDINE IN NACL 20-0.9 MG/50ML-% IV SOLN
20.0000 mg | Freq: Two times a day (BID) | INTRAVENOUS | Status: DC
  Administered 2016-01-11 – 2016-01-16 (×11): 20 mg via INTRAVENOUS
  Filled 2016-01-11 (×11): qty 50

## 2016-01-11 MED ORDER — METOCLOPRAMIDE HCL 5 MG/ML IJ SOLN
5.0000 mg | Freq: Three times a day (TID) | INTRAMUSCULAR | Status: AC
  Administered 2016-01-11 (×3): 5 mg via INTRAVENOUS
  Filled 2016-01-11 (×3): qty 2

## 2016-01-11 MED ORDER — ONDANSETRON HCL 4 MG/2ML IJ SOLN: 4.0000 mg | Freq: Four times a day (QID) | INTRAMUSCULAR | Status: DC | PRN

## 2016-01-11 NOTE — Progress Notes (Signed)
Pharmacy Antibiotic Note  Curtis Frazier is a 75 y.o. male with PMHx CAD s/p stent, PAF on warfarin s/p recent ablation, CKD-III, DM, diastolic CHF, and RA on chronic prednisone with recent hospitalization for PNA and discharged 7/22,  presents 7/24 from SNF for AMS.  Bronch performed showing diffuse, thick white creamy pus throughout.  CT shows interlobular septal thickening with bilateral ground-glass attenuation with features compatible with pulmonary edema although superimposed infectious / inflammatory etiology not excluded. Pharmacy was intitially consulted for Vanc and Primaxin dosing, but ID consulted on 7/25 and Vanc & Primaxin have been D/C'ed.  Zyvox and Levofloxacin to begin and Pharmacy consulted to dose Meropenem.    CRRT was initiated on 7/25  (estimate CrCl ~ 25 -30 ml/min) D9 Abx, D6 Eraxis -WBC remains elevated likely 2/2 steroids  Plan:  Zyvox 600mg  IV q12h per MD  Continue Meropenem 1gm IV q12h  Continue Eraxis 100mg  IV q24h  F/u renal function, cultures, clinical course  Height: 5\' 8"  (172.7 cm) Weight: 148 lb 13 oz (67.5 kg) IBW/kg (Calculated) : 68.4  Temp (24hrs), Avg:99.1 F (37.3 C), Min:98.8 F (37.1 C), Max:99.3 F (37.4 C)   Recent Labs Lab 01/07/16 0210  01/08/16 0430  01/09/16 0400 01/09/16 1551 01/10/16 0444 01/10/16 1608 01/11/16 0445  WBC 20.9*  --  20.1*  --  22.1*  --  19.4*  --  20.7*  CREATININE 1.42*  1.57*  < > 1.58*  < > 1.30* 1.24 1.55* 1.52* 1.47*  < > = values in this interval not displayed.  Estimated Creatinine Clearance: 40.8 mL/min (by C-G formula based on SCr of 1.47 mg/dL).    Allergies  Allergen Reactions  . Ace Inhibitors Other (See Comments)    Causes high K   . Angiotensin Receptor Blockers Other (See Comments)    Causes high K   . Metoprolol Other (See Comments)    May cause elevated K level   . Penicillins Swelling and Rash    Has patient had a PCN reaction causing immediate rash, facial/tongue/throat  swelling, SOB or lightheadedness with hypotension: YES Has patient had a PCN reaction causing severe rash involving mucus membranes or skin necrosis: NO Has patient had a PCN reaction that required hospitalization NO Has patient had a PCN reaction occurring within the last 10 years: NO If all of the above answers are "NO", then may proceed with Cephalosporin use. Pt has tolerated cephalosporins in the past   . Amiodarone Other (See Comments)    12/27/15: Cardiologist discontinued the amiodarone (on PTA) due to  concern for toxicity  (in hospitial with acute hypoxemic respiratory failure)   . Tetracycline Swelling and Rash    Antimicrobials this admission: 7/24 Vancomycin >> 7/25 7/24 Primaxin >> 7/25 7/25 Meropenem >> 7/25 Zyvox >>  7/25 Levofloxacin >> 8/1 7/26 Anidulafungin >>  Dose adjustments this admission: Merrem adjusted for CRRT  Microbiology results: 7/24 BCx: 1/2 Staph species, 1/2 NG 7/24 BCID: Staphylococcus species DETECTED  7/24 BAL: rare GPC pairs, rare gram variable, rare yeast - consistent with normal flora 7/24 UCx: NGF 7/24 BCx: NGTD 7/24 MRSA PCR: negative 7/24 Resp panel: none detected 7/27 Pneumocystis smear (BAL): negative 7/27 Resp cx: pending, (gram stain: Rare yeast, rare GPC in pairs, chains) 7/27 BCx: NGTD  Thank you for allowing pharmacy to be a part of this patient's care.  Adrian Saran, PharmD, BCPS Pager [phone] 01/11/2016 8:47 AM

## 2016-01-11 NOTE — Progress Notes (Signed)
Curtis Frazier Progress Note   Subjective: Pressors have been weaned more- attempting some vent weaning as well - CRRT running well - NO uop.  Appears to have ileus 2 liters out of NGT   Vitals:   01/11/16 0748 01/11/16 0800 01/11/16 0830 01/11/16 0839  BP: 105/71 (!) 74/42 (!) 82/64   Pulse: 98     Resp: 18 18 11 17  Temp:      TempSrc:      SpO2: 92%  92% 93%  Weight:      Height:        Inpatient medications: . dextrose      . anidulafungin  100 mg Intravenous Q24H  . antiseptic oral rinse  7 mL Mouth Rinse QID  . chlorhexidine gluconate (SAGE KIT)  15 mL Mouth Rinse BID  . famotidine (PEPCID) IV  20 mg Intravenous Q12H  . heparin subcutaneous  5,000 Units Subcutaneous Q8H  . insulin aspart  0-15 Units Subcutaneous Q4H  . ipratropium-albuterol  3 mL Nebulization Q6H  . linezolid (ZYVOX) IV  600 mg Intravenous Q12H  . meropenem (MERREM) IV  1 g Intravenous Q12H  . methylPREDNISolone (SOLU-MEDROL) injection  40 mg Intravenous Daily  . metoCLOPramide (REGLAN) injection  5 mg Intravenous Q8H  . nystatin  5 mL Oral TID  . nystatin   Topical TID  . sennosides  5 mL Per Tube Q12H  . sodium chloride flush  10-40 mL Intracatheter Q12H  . sorbitol, milk of mag, mineral oil, glycerin (SMOG) enema  960 mL Rectal Once   . sodium chloride 30 mL/hr at 01/11/16 0700  . fentaNYL infusion INTRAVENOUS 125 mcg/hr (01/11/16 0700)  . phenylephrine (NEO-SYNEPHRINE) Adult infusion 8 mcg/min (01/11/16 0844)  . dialysis replacement fluid (prismasate) 400 mL/hr at 01/10/16 2252  . dialysis replacement fluid (prismasate) 200 mL/hr at 01/11/16 0129  . dialysate (PRISMASATE) 2,000 mL/hr at 01/11/16 0921   sodium chloride, albuterol, alteplase, fentaNYL, heparin, ipratropium, midazolam, ondansetron (ZOFRAN) IV, sodium chloride flush  Exam: Gen on vent, eyes open, no commands  Sclera anicteric, throat w ETT-  Right IJ vascath placed 7/25 No jvd or bruits Chest some post crackles at  bases RRR no MRG Abd soft nondistended no ascites or mass GU normal male w Foley Ext no LE or UE edema Neuro as above  CT chest > underlying emphysema w bilat patchy infiltrates/ ground glass changes, LL > UL's CXR 7.25 > patchy bilat infiltrates UA - negative CVP 10-12  Assessment: 1  Acute on CRF3 - baseline creatinine in the high 1's- ischemic ATN due to shock most likely, CRRT D#7.  Less than 10 cc's of urine last 24 hours so not able to liberate from CRRT at this time- no heparin- all 4 K dialysate 2  Recurrent resp failure - ARDS + prob HCAP- per CCM- making some positive movements 3  Volume - CVP low, stable wts, no vol excess- running even- will bolus him outside of machine and give CCM license to do that as well as I do not think is wet 4  COPD underlying 5  Hx RA/ psor arthriitis/ PMR - chron immunosuppressed for yrs (methotrexate, Remicade/ Humira, pred) 6. Acidosis- improved 7. Elytes- stable     , A  01/11/2016, 10:42 AM    Recent Labs Lab 01/10/16 0444 01/10/16 1608 01/11/16 0445  NA 138 135 136  K 4.5 4.4 4.1  CL 106 105 103  CO2 27 25 27  GLUCOSE 147* 193* 59*    BUN 33* 33* 35*  CREATININE 1.55* 1.52* 1.47*  CALCIUM 8.6* 8.1* 8.4*  PHOS 4.1 3.8 3.7    Recent Labs Lab 01/10/16 0444 01/10/16 1608 01/11/16 0445  ALBUMIN 2.3* 2.3* 2.3*    Recent Labs Lab 01/09/16 0400 01/10/16 0444 01/11/16 0445  WBC 22.1* 19.4* 20.7*  NEUTROABS 20.9* 18.1* 17.8*  HGB 11.2* 11.4* 11.1*  HCT 37.6* 37.9* 36.7*  MCV 98.4 97.9 97.6  PLT 186 189 202   Iron/TIBC/Ferritin/ %Sat    Component Value Date/Time   IRON 24 (L) 08/13/2015 0430   TIBC 258 08/13/2015 0430   FERRITIN 147 08/13/2015 0430   IRONPCTSAT 9 (L) 08/13/2015 0430     

## 2016-01-11 NOTE — Progress Notes (Signed)
[facility] Progress Note Patient Name: Curtis Frazier DOB: [birth year] MRN: [id number]   Date of Service  01/11/2016  HPI/Events of Note  RN reports pt vomiting a lot of TF during bath.   Comfortable now, not in distress.   On 40%, 94% sats  eICU Interventions  Hold TF for now.  Check CBG q 4.       Intervention Category Intermediate Interventions: Other:  [place] 01/11/2016, 1:07 AM

## 2016-01-11 NOTE — Progress Notes (Signed)
CSW following to assist with d/c planning. Pt remains on vent. Disposition to be determined. CSW will continue to follow to assist with d/c planning as needed.  Werner Lean LCSW [phone]

## 2016-01-11 NOTE — Progress Notes (Addendum)
Nutrition Follow-up  DOCUMENTATION CODES:   Non-severe (moderate) malnutrition in context of acute illness/injury  INTERVENTION:  - Will d/c TF order and re-order when appropriate. - RD will follow-up 8/2.  NUTRITION DIAGNOSIS:   Inadequate oral intake related to inability to eat as evidenced by NPO status. -ongoing  GOAL:   Patient will meet greater than or equal to 90% of their needs -unable to meet with TF currently on hold.   MONITOR:   Vent status, TF tolerance, Weight trends, Labs, Skin, I & O's  ASSESSMENT:   75 year old male with PMH as below which is significant for Diastolic CHF, CKD, COPD, PAF (s/p recent ablation), and rheumatoid arthritis (recently on remicade, now on chronic pred 5mg ). He has several recent hospitalizations. Most recently he was admitted 7/15 for presumed HCAP. He was streated with 9 days of HCAP antibiotics and was transferred to [facility] 7/22. 7/24 he was noted to have AMS to the point where he was essentially comatose. O2 sats noted to be about 60% on room air at that time. He was transported to [facility] ED for further evaluation where he was placed on BiPAP with some improvement. CXR was performed which is consistent with interstitial lung changes noted on very recent CT.  8/1 Pt continues on CRRT and weight trending down since admission; CBW (67.5 kg) used to re-estimate nutrition needs this AM. Notes indicates that early this AM pt vomited during bathing and TF was placed on hold and OGT hooked to suction at that time; 1200cc return. Spoke with RN at bedside who reports OGT now clamped and KUB performed and awaiting results.   Patient is currently intubated on ventilator support MV: 9.7 L/min Temp (24hrs), Avg:99.1 F (37.3 C), Min:98.8 F (37.1 C), Max:99.3 F (37.4 C) Propofol: none  Current TF order: Vital 1.2 @ 60 mL/hr which provides 1728 kcal, 108 grams protein, and 1168 mL free water. Will adjust TF regimen as outlined above (for when re-start is  appropriate) based on re-estimated needs. Per CCM NP note this AM, Reglan order to be placed.   RD will follow-up tomorrow. Pt unable to meet needs at this time. Medications reviewed; sliding scale Novolog, 40 mg IV Solumedrol/day, 5 mg IV Reglan every 8 hours, PRN Zofran, 40 mg Protonix/day. Labs reviewed; CBGs: 57-312 mg/dL since midnight, BUN: 35 mg/dL, creatinine: 1.47 mg/dL, Ca: 8.4 mg/dL, Mg: 2.6 mg/dL, GFR: 45 mL/min.  IVF: NS @ 30 mL/hr Drips: Neo @ 8 mcg/min, Fentanyl @ 125 mcg/min.  ADDENDUM: Per rounds this AM, OGT to be re-attached to suction as KUB showed large amount of stool in GI tract. Also per rounds, possible trach in a few days and pt to remain on CRRT for the foreseeable future.    7/31 Patient is currently intubated on ventilator support MV: 8 L/min Temp (24hrs), Avg:98.1 F (36.7 C), Min:97.5 F (36.4 C), Max:98.8 F (37.1 C) Propofol: none  - Pt has been  weaned from pressors, weaning from vent some.  - CRRT continues, fluid stable. - Tolerating TF @ 60 - Receiving 2248cc free water at this time with NS @ 48mL/hr and TF @ 60.  - 100cc urine over last 24 hours.   7/28 - Pt continues on CVVHD (day #4) with OGT in place.  - Currently receiving TF at goal rate per order: Vital 1.2 @ 55 mL/hr with 30 mL Prostat once/day which is providing 1684 kcal, 114 grams of protein, and 1071 mL free water.   Patient is  currently intubated on ventilator support MV: 11.3L/min Temp (24hrs), Avg:98.4 F (36.9 C), Min:97 F (36.1 C), Max:99 F (37.2 C) Propofol: none  - Estimated kcal needs updated this AM; continue to use admission weight of 71.1 kg. - Nephrology note indicates pt volume now WDL/no fluid overload.  - Notes also indicated possible STEMI overnight which has now been ruled-out.   Drips: Neo @ 33 mcg/min, Fentanyl @ 100 mcg/hr.     Diet Order:  Diet NPO time specified  Skin:  Wound (see comment) (L ear bruising)  Last BM:  PTA  Height:    Ht Readings from Last 1 Encounters:  01/05/16 5\' 8"  (1.727 m)    Weight:   Wt Readings from Last 1 Encounters:  01/11/16 148 lb 13 oz (67.5 kg)    Ideal Body Weight:  70 kg (kg)  BMI:  Body mass index is 22.63 kg/m.  Estimated Nutritional Needs:   Kcal:  [id number]  Protein:  101-115 grams (1.5-1.7 grams/kg)  Fluid:  >/= 1.6 L/day  EDUCATION NEEDS:   No education needs identified at this time    Jarome Matin, MS, RD, LDN Inpatient Clinical Dietitian Pager # [phone] After hours/weekend pager # [phone]

## 2016-01-11 NOTE — Progress Notes (Signed)
PULMONARY / CRITICAL CARE MEDICINE   Name: Curtis Frazier MRN: [id number] DOB: 1940/08/17    ADMISSION DATE:  01/03/2016 CONSULTATION DATE:  01/03/2016  REFERRING MD:  Dr. Dayna Barker EDP  CHIEF COMPLAINT:  SOB/AMS  BRIEF: 75 y/o male with a rheumatoid arthritis and a recent admission for acute respiratory failure with hypoxemia presumably due to HCAP was re-admitted to [facility] on 7/24 with recurrent acute on chronic hypoxemic respiratory failure with a picture consistent with ARDS.   SUBJECTIVE:  RN reports vomiting overnight - large volume emesis, then NGT hooked to suction with 2L out, no distress after event.  Hypoglycemia requiring D50w.  TF on hold, NGT clamped this am.  PVC"s overnight, sedation increased.    VITAL SIGNS: BP 105/71   Pulse 98   Temp 98.8 F (37.1 C)   Resp 18   Ht 5\' 8"  (1.727 m)   Wt 148 lb 13 oz (67.5 kg)   SpO2 92%   BMI 22.63 kg/m   HEMODYNAMICS: CVP:  [3 mmHg-18 mmHg] 3 mmHg  VENTILATOR SETTINGS: Vent Mode: PRVC FiO2 (%):  [40 %] 40 % Set Rate:  [18 bmp] 18 bmp Vt Set:  [500 mL] 500 mL PEEP:  [8 cmH20] 8 cmH20 Pressure Support:  [6 cmH20-8 cmH20] 8 cmH20 Plateau Pressure:  [11 cmH20-18 cmH20] 13 cmH20  INTAKE / OUTPUT: I/O last 3 completed shifts: In: 5970.7 [I.V.:2713.7; Other:5; [id number]; IV [id number] Out: [id number] [Urine:14; Emesis/NG output:2100; [id number]  PHYSICAL EXAMINATION: General:  Sedated. No family at bedside. No distress.  Integument:  Warm & dry. Mild erythematous rash on scrotum. HEENT: Dry mucous membranes. No scleral icterus. Endotracheal tube in place. MM pink/dry, small area of erythema on right ear Cardiovascular:  Regular rate. occasional PVC's. No appreciable JVD  Pulmonary:  Coarse breath sounds bilaterally. Symmetric chest wall rise on ventilator. Abdomen: Soft. Hypoactive bowel sounds unchanged. Protuberant.  Musculoskeletal:  Normal bulk. No joint deformity or effusion appreciated. Neurological: Sedated, Pupils  symmetric. No response to voice  LABS:  BMET  Recent Labs Lab 01/10/16 0444 01/10/16 1608 01/11/16 0445  NA 138 135 136  K 4.5 4.4 4.1  CL 106 105 103  CO2 27 25 27   BUN 33* 33* 35*  CREATININE 1.55* 1.52* 1.47*  GLUCOSE 147* 193* 59*    Electrolytes  Recent Labs Lab 01/09/16 0400  01/10/16 0444 01/10/16 1608 01/11/16 0445  CALCIUM 8.3*  < > 8.6* 8.1* 8.4*  MG 2.4  --  2.5*  --  2.6*  PHOS 4.1  < > 4.1 3.8 3.7  < > = values in this interval not displayed.  CBC  Recent Labs Lab 01/09/16 0400 01/10/16 0444 01/11/16 0445  WBC 22.1* 19.4* 20.7*  HGB 11.2* 11.4* 11.1*  HCT 37.6* 37.9* 36.7*  PLT 186 189 202    Coag's  Recent Labs Lab 01/09/16 0400 01/10/16 0444 01/11/16 0445  APTT 44* 41* 35  INR 2.53 2.29 1.87    Sepsis Markers No results for input(s): LATICACIDVEN, PROCALCITON, O2SATVEN in the last 168 hours.  ABG  Recent Labs Lab 01/07/16 1155 01/08/16 0336 01/09/16 0336  PHART 7.292* 7.249* 7.236*  PCO2ART 52.2* 61.0* 62.9*  PO2ART 64.8* 71.7* 73.8*    Liver Enzymes  Recent Labs Lab 01/10/16 0444 01/10/16 1608 01/11/16 0445  ALBUMIN 2.3* 2.3* 2.3*    Cardiac Enzymes  Recent Labs Lab 01/09/16 1210 01/09/16 1800 01/10/16 0022  TROPONINI 0.03* 0.03* 0.04*    Glucose  Recent Labs Lab 01/11/16 0450 01/11/16 0456 01/11/16  0540 01/11/16 0609 01/11/16 0612 01/11/16 0733  GLUCAP 138* 57* 70 70 133* 81    Imaging Dg Chest Port 1 View  Result Date: 01/10/2016 CLINICAL DATA:  ARDS EXAM: PORTABLE CHEST 1 VIEW COMPARISON:  01/08/2016 FINDINGS: Support devices are stable. Diffuse interstitial and alveolar opacities within the lungs, not significantly changed. Mild cardiomegaly. Suspect small left effusion. The right effusion. IMPRESSION: Continued diffuse interstitial and alveolar opacities, most confluent at the left base. No change. Small left effusion. Electronically Signed   By: Rolm Baptise M.D.   On: 01/10/2016  09:45    STUDIES:  CTA Chest 7/15: No CT evidence for acute pulmonary embolus. Interlobular septal thickening with bilateral ground-glass attenuation. Imaging features are compatible with pulmonary edema although superimposed infectious/inflammatory etiology is not excluded. Bibasilar collapse/ consolidation. Interval progression of mediastinal and right hilar lymphadenopathy, nonspecific.  TTE 7/26: Mild concentric LVH. EF 55-60%. Grade 1 diastolic dysfunction. LA severely dilated & RA normal in size. RV normal in size and function. Pulmonary artery systolic pressure 40 mmHg. Moderate aortic stenosis without regurgitation. Aortic root normal in size. No mitral stenosis with mild regurgitation. No pulmonic stenosis. Moderate tricuspid regurgitation. No pericardial effusion. Port CXR 7/29:  ETT entering right main stem. Enteric feeding tube appears to be in the stomach. Right and left central venous catheters in place. Port CXR 7/29: Endotracheal tube in good position. EKG 7/30:  Sinus rhythm. Lateral ST segment elevation w/ QTc 366ms.  MICROBIOLOGY: MRSA PCR 7/24:  Negative  Respiratory Viral Panel PCR 7/15:  Negative  Blood Ctx x4 7/24: 1/4 Coag Neg Staph Urine Ctx 7/24:  Negative  Tracheal Asp Ctx 7/24:  Oral Flora Respiratory Viral Panel PCR 7/24:  Negative  PCP 7/24 > not sent Tracheal Asp Ctx 7/27: Normal respiratory flora PJP Trach Asp 7/27:  Negative  Blood Ctx x2 7/27 >>  ANTIBIOTICS: Aztreonam 7/24 >7/25 Imipenem 7/24 - 7/25 Vancomycin 7/24 > 7/25 Levaquin 7/25 >> 8/1 Meropenem 7/25 >> Linezolid 7/25 >>  Anidulofungin 7/27 >>   SIGNIFICANT EVENTS: 7/22 - discharge to SNF 7/24 - Admit, intubated for respiratory failure  7/25 - worsening acidosis, off pressors, ARDS protocol, CVVHD initiated 7/26 - Nimbex discontinued  7/30 - hypotension with sedation, vasopressors restarted, ST segment changes  7/31 - Weaned on PSV ~ 2 hours, vomiting overnight, NGT to  suction  LINES/TUBES: OETT 8.5 7/24 >> OGT 7/24 >> L Subclavian CVL 7/25 >>  R IJ HD CVL 7/25 >> Foley 7/25 >> PIV x1  DISCUSSION: 75 year old male with baseline RA and COPD and a recent admission for acute respiratory failure with hypoxemia was admitted on 7/24 with ARDS most likely due to HCAP but there has been question of underlying ILD related to his rheumatoid arthritis.  Has multi-organ failure.  Off ARDS protocol, CVVH on going.    ASSESSMENT / PLAN:  PULMONARY A: Acute Hypoxic Respiratory Failure - Improving. Secondary to ARDS/HCAP. ARDS - Switched off ARDS protocol 7/29. HCAP vs Autoimmune Process H/O COPD/Emphysema Mediastinal Lymphadenopathy H/O Sleep Apnea  P:   PRVC Vt 500 / 18 / 8 / 40% Wean FiO2 for Sat >94% SBT/WUA daily Solu-Medrol IV to 40 mg QD Duoneb q6hr  INFECTIOUS A:   HCAP - Cultures negative but BAL lost from admission. Possible Bacteremia - Staph likely contaminate. Repeat cultures on 7/27 pending. Scrotal Rash - suspect yeast/fungus.  P:   ID Following & guiding antibiotics Levaquin Day #7/7; Meropenem Day #8; Linezolid Day #8, & Eraxis Day #6  Awaiting finalization of blood cultures Nystatin Powder to groin & Nystatin S&S oral   CARDIOVASCULAR A:  Abnormal EKG - ST elevation RCA distribution, resolved spontaneously without hemodynamic change, uncertain etiology. Seen on 7/28 & again 7/30. Suspect demand with hypotension in setting of moderate aortic stenosis. Shock - Likely sedation & CVVHD related.  H/O CAD H/O Carotid Artery Disease H/O Chronic Diastolic CHF H/O HTN H/O A fib S/P Ablation H/O Hyperlipidemia H/O Moderate Aortic Stenosis  P:  Continuous Telemetry Monitoring Vitals per unit protocol Neo-Synephrine to maintain MAP >65 ASA VT 81mg  daily Trending Troponin I  INR 1.87 on 8/1, previously on coumadin.  NSR at present, begin SQ heparin for DVT prophylaxis > if new AF, heparin gtt Consider Statin if bowels  move  RENAL A:   Acute on Chronic Renal Failure Stage 3 - Oliguric to anuric.  Metabolic Acidosis - Resolved.  P:   Nephrology Following & guiding dialysis Continuing CVVHD Goal Net Even Volume Status Trend BMP  Monitor for UOP  Replace electrolytes as indicated  GASTROINTESTINAL A:   Nausea / Vomiting - new 8/1, TF on hold Constipation H/O GERD & Gastritis H/O Hemorrhoids H/O Colon Polyps  P:   NPO Hold Tube Feeds 8/1 Reglan 5mg  Q8 x 3 doses  Pepcid IV q24hr Senna bid VT PRN zofran Assess KUB to r/o ileus   HEMATOLOGIC A:   Coagulopathy - Secondary to Coumadin. Leukocytosis - Likely multifactorial. Unchanged. Anemia - No signs of active bleeding. Mild & Hgb stable.  P:  Trending cell counts daily w/ CBC Daily INR Transfuse for active bleeding of Hgb <8.0 Begin heparin SQ for DVT prophylaxis  ENDOCRINE A:   Chronic Prednisone - For RA/PMR. H/O DM Type 2 - Hyperglycemia improved but still significant.  P:   Continue IV Solu-Medrol daily, reduce to 40 mg QD HOLD NPH with N/V, NGT to suction (home dose Levemir 15u daily) Continue Accu-Checks q4hr  Continue Moderate Dose SSI per algorithm  NEUROLOGIC A:   Acute Encephalopathy - Multifactorial. Likely combination of metabolic & hypoxemia. Sedation on Ventilator   P:   RASS Goal: 0 to -1 Fentanyl gtt & IV for Pain Versed IV prn Sedation  FAMILY  - Updates: Daughter updated at bedside 7/31 per Dr. Halford Chessman.  No family am 8/1 on NP rounds.  Possible family meeting this am as son arrived from [place].     [name], NP-C [facility] Pgr: [phone] or if no answer [phone] 01/11/2016, 8:18 AM   Large amount of gastric residual.  KUB with stool in colon.  Remains on pressors, but at lower dose.  B/l rhonchi.  HR regular.  Abd soft, mild distention, increased tympany.  Sore on Rt ear.  WBC 20.7, Hb 11.1, Creatinine 1.47, INR 1.87.  Assessment/plan:  Acute hypoxic respiratory  failure. Hx of COPD/emphysema. Hx OSA. - pressure support  - will d/w family about trach - continue BDs - f/u CXR  HCAP. Scrotal rash with yeast. - continue Abx, antifungals per ID  Hypotension. - wean off pressors to keep MAP > 65  AKI with hx of CKD 3. 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 - wean steroids as tolerated  Gastric residual. Constipation. - limited opiates - hemoccult gastric fluid - hold tube feeds - OG tube to suction - SMOG x on 8/01  Updated pt's son at bedside.  He reports that family had bad experience with [facility] during previous hospital stay.  We  need to have further discussions with family about disposition as he progresses.  CC time by me independent of APP time is 33 minutes.  Chesley Mires, MD [facility] 01/11/2016, 10:58 AM Pager:  [phone] After 3pm call: [phone]

## 2016-01-11 NOTE — Consult Note (Addendum)
[facility] Nurse wound consult note Reason for Consult:Right ear with deep tissue pressure injury in critically ill patient on CRRT. Also noted deep tissue pressure injury at coccyx Wound type:pressure Pressure Ulcer POA: No Measurement:Right ear: 4.5cm x 2cm with 1.4cm x 1cm area of deeper hued tissue. Warm. Coccyx:  New onset of 5cm x 5cm area of erythema with deep purple area measuring 1.5cm x 1cm at distal end.  Non-blanching. Wound bed: No open wound.  Areas are erythematous, with deeper hues as noted above.  Drainage (amount, consistency, odor)  Periwound: intact. Dressing procedure/placement/frequency: Positioning suggestions provided, topical care orders provided for twice daily application of antimicrobial ointment (bacitracin) to right ear. Bilateral pressure redistribution heel boots provided for heels with intact, but slow-to-blanch areas. Prophylactic dressing covers intact sacrum, this is removed during enema (for impaction) and will now be left off.  In its place we will protect injured tissue with moisture barrier cream (Critic Aid).  Patient is on a therapeutic mattress with low air loss feature. Turning and repositioning will continue. [place] nursing team will not follow, but will remain available to this patient, the nursing and medical teams.  Please re-consult if needed. Thanks, Maudie Flakes, MSN, RN, [place], Arther Abbott  Pager# [phone]

## 2016-01-11 NOTE — Progress Notes (Signed)
Patient had large vomit episode during bath. Tube feeds were on hold during entire bath. [place] notified of episode. Order given to place tube feeds on hold. Patient deep suctioned down ETT and orally. OG tube connected to low wall suction, 1221mL brown output in canister. Will continue to monitor.

## 2016-01-11 NOTE — Progress Notes (Signed)
INFECTIOUS DISEASE PROGRESS NOTE  ID: Curtis Frazier is a 75 y.o. male with  Active Problems:   Acute respiratory failure with hypoxemia ([place])   Visit for intubation   ARDS (adult respiratory distress syndrome) (HCC)   Malnutrition of moderate degree   abtx say #9 : linezolid #7/meropenem #7/anidula #5 Subjective: Overnight had large bout of emesis, NG now in place with 2L output. Remains afebrile. Less pressor needs. Gastric fluid dark appear + heme positive  Abtx:  Anti-infectives    Start     Dose/Rate Route Frequency Ordered Stop   01/07/16 1700  anidulafungin (ERAXIS) 100 mg in sodium chloride 0.9 % 100 mL IVPB     100 mg over 90 Minutes Intravenous Every 24 hours 01/06/16 1537     01/06/16 1630  anidulafungin (ERAXIS) 200 mg in sodium chloride 0.9 % 200 mL IVPB     200 mg over 180 Minutes Intravenous  Once 01/06/16 1535 01/06/16 1954   01/05/16 2200  meropenem (MERREM) 1 g in sodium chloride 0.9 % 100 mL IVPB     1 g 200 mL/hr over 30 Minutes Intravenous Every 12 hours 01/05/16 1023     01/05/16 1030  meropenem (MERREM) 500 mg in sodium chloride 0.9 % 50 mL IVPB     500 mg 100 mL/hr over 30 Minutes Intravenous STAT 01/05/16 1023 01/05/16 1125   01/05/16 0100  meropenem (MERREM) 500 mg in sodium chloride 0.9 % 50 mL IVPB  Status:  Discontinued     500 mg 100 mL/hr over 30 Minutes Intravenous Every 8 hours 01/04/16 1620 01/05/16 1023   01/04/16 2200  linezolid (ZYVOX) IVPB 600 mg     600 mg 300 mL/hr over 60 Minutes Intravenous Every 12 hours 01/04/16 1555     01/04/16 2000  vancomycin (VANCOCIN) IVPB 1000 mg/200 mL premix  Status:  Discontinued     1,000 mg 200 mL/hr over 60 Minutes Intravenous Every 24 hours 01/03/16 2016 01/04/16 0730   01/04/16 2000  vancomycin (VANCOCIN) IVPB 750 mg/150 ml premix  Status:  Discontinued     750 mg 150 mL/hr over 60 Minutes Intravenous Every 24 hours 01/04/16 0730 01/04/16 1555   01/04/16 1800  imipenem-cilastatin (PRIMAXIN) 500 mg in  sodium chloride 0.9 % 100 mL IVPB  Status:  Discontinued     500 mg 200 mL/hr over 30 Minutes Intravenous Every 12 hours 01/04/16 0730 01/04/16 1555   01/04/16 1700  levofloxacin (LEVAQUIN) IVPB 500 mg  Status:  Discontinued     500 mg 100 mL/hr over 60 Minutes Intravenous Every 24 hours 01/04/16 1555 01/11/16 0843   01/04/16 1700  meropenem (MERREM) 1 g in sodium chloride 0.9 % 100 mL IVPB     1 g 200 mL/hr over 30 Minutes Intravenous  Once 01/04/16 1619 01/04/16 1712   01/04/16 0400  imipenem-cilastatin (PRIMAXIN) 500 mg in sodium chloride 0.9 % 100 mL IVPB  Status:  Discontinued     500 mg 200 mL/hr over 30 Minutes Intravenous Every 8 hours 01/03/16 2016 01/04/16 0730   01/03/16 2000  imipenem-cilastatin (PRIMAXIN) 500 mg in sodium chloride 0.9 % 100 mL IVPB     500 mg 200 mL/hr over 30 Minutes Intravenous STAT 01/03/16 1953 01/03/16 2114   01/03/16 1830  vancomycin (VANCOCIN) IVPB 1000 mg/200 mL premix     1,000 mg 200 mL/hr over 60 Minutes Intravenous  Once 01/03/16 1819 01/03/16 2043      Medications:  Scheduled: . anidulafungin  100  mg Intravenous Q24H  . antiseptic oral rinse  7 mL Mouth Rinse QID  . chlorhexidine gluconate (SAGE KIT)  15 mL Mouth Rinse BID  . famotidine (PEPCID) IV  20 mg Intravenous Q12H  . heparin subcutaneous  5,000 Units Subcutaneous Q8H  . insulin aspart  0-15 Units Subcutaneous Q4H  . ipratropium-albuterol  3 mL Nebulization Q6H  . linezolid (ZYVOX) IV  600 mg Intravenous Q12H  . meropenem (MERREM) IV  1 g Intravenous Q12H  . methylPREDNISolone (SOLU-MEDROL) injection  40 mg Intravenous Daily  . metoCLOPramide (REGLAN) injection  5 mg Intravenous Q8H  . nystatin  5 mL Oral TID  . nystatin   Topical TID  . sennosides  5 mL Per Tube Q12H  . sodium chloride flush  10-40 mL Intracatheter Q12H    Objective: Vital signs in last 24 hours: Temp:  [98.7 F (37.1 C)-99.3 F (37.4 C)] 99 F (37.2 C) (08/01 1400) Pulse Rate:  [95-104] 104 (08/01  1105) Resp:  [11-24] 16 (08/01 1400) BP: (65-178)/(40-78) 142/65 (08/01 1400) SpO2:  [90 %-100 %] 98 % (08/01 1400) FiO2 (%):  [40 %] 40 % (08/01 1105) Weight:  [148 lb 13 oz (67.5 kg)] 148 lb 13 oz (67.5 kg) (08/01 0430) Physical Exam  Constitutional: He is intubated/sedated. He appears chronically ill and well-nourished. No distress.  HENT: OETT in place,  Cardiovascular: Normal rate, regular rhythm and normal heart sounds. Exam reveals no gallop and no friction rub.  No murmur heard.  Pulmonary/Chest: Effort normal and breath sounds normal. No respiratory distress. He has no wheezes.  Abdominal: distended. absent bowel sounds Skin: Skin is warm and dry. No rash noted. No erythema. Right ear pinna abrasion c/w pressure ulcer, no purulent drainage  Lab Results  Recent Labs  01/10/16 0444 01/10/16 1608 01/11/16 0445  WBC 19.4*  --  20.7*  HGB 11.4*  --  11.1*  HCT 37.9*  --  36.7*  NA 138 135 136  K 4.5 4.4 4.1  CL 106 105 103  CO2 _0 BUN 33* 33* 35*  CREATININE 1.55* 1.52* 1.47*   Liver Panel  Recent Labs  01/10/16 1608 01/11/16 0445  ALBUMIN 2.3* 2.3*   Microbiology: Lurline Idol Cx "resp flora" (unfortunately his BAL did not make it to lab) BCx 1/2 CNS (contaminant), UCx negative. Resp virus panel (-) ur leg (-) CXR unchanged Recent Results (from the past 240 hour(s))  MRSA PCR Screening     Status: None   Collection Time: 01/03/16  5:54 PM  Result Value Ref Range Status   MRSA by PCR NEGATIVE NEGATIVE Final    Comment:        The GeneXpert MRSA Assay (FDA approved for NASAL specimens only), is one component of a comprehensive MRSA colonization surveillance program. It is not intended to diagnose MRSA infection nor to guide or monitor treatment for MRSA infections.   Blood Culture (routine x 2)     Status: None   Collection Time: 01/03/16  6:44 PM  Result Value Ref Range Status   Specimen Description BLOOD LEFT ANTECUBITAL  Final   Special Requests  BOTTLES DRAWN AEROBIC AND ANAEROBIC 5CC  Final   Culture   Final    NO GROWTH 5 DAYS Performed at [facility]    Report Status 01/08/2016 FINAL  Final  Blood Culture (routine x 2)     Status: Abnormal   Collection Time: 01/03/16  6:50 PM  Result Value Ref Range Status  Specimen Description BLOOD LEFT HAND  Final   Special Requests BOTTLES DRAWN AEROBIC AND ANAEROBIC 5CC  Final   Culture  Setup Time   Final    GRAM POSITIVE COCCI IN CLUSTERS AEROBIC BOTTLE ONLY CRITICAL RESULT CALLED TO, READ BACK BY AND VERIFIED WITH: E. JACKSON, [facility] PHARMD AT [place] ON 01/04/16 BY C. JESSUP, MLT.    Culture (A)  Final    STAPHYLOCOCCUS SPECIES (COAGULASE NEGATIVE) THE SIGNIFICANCE OF ISOLATING THIS ORGANISM FROM A SINGLE SET OF BLOOD CULTURES WHEN MULTIPLE SETS ARE DRAWN IS UNCERTAIN. PLEASE NOTIFY THE MICROBIOLOGY DEPARTMENT WITHIN ONE WEEK IF SPECIATION AND SENSITIVITIES ARE REQUIRED. Performed at [facility]    Report Status 01/05/2016 FINAL  Final  Blood Culture ID Panel (Reflexed)     Status: Abnormal   Collection Time: 01/03/16  6:50 PM  Result Value Ref Range Status   Enterococcus species NOT DETECTED NOT DETECTED Final   Vancomycin resistance NOT DETECTED NOT DETECTED Final   Listeria monocytogenes NOT DETECTED NOT DETECTED Final   Staphylococcus species DETECTED (A) NOT DETECTED Final    Comment: CRITICAL RESULT CALLED TO, READ BACK BY AND VERIFIED WITH: [place], [facility] PHARMD AT 1840 ON 01/04/16 BY C. JESSUP, MLT.    Staphylococcus aureus NOT DETECTED NOT DETECTED Final   Methicillin resistance NOT DETECTED NOT DETECTED Final   Streptococcus species NOT DETECTED NOT DETECTED Final   Streptococcus agalactiae NOT DETECTED NOT DETECTED Final   Streptococcus pneumoniae NOT DETECTED NOT DETECTED Final   Streptococcus pyogenes NOT DETECTED NOT DETECTED Final   Acinetobacter baumannii NOT DETECTED NOT DETECTED Final   Enterobacteriaceae species NOT DETECTED NOT DETECTED Final    Enterobacter cloacae complex NOT DETECTED NOT DETECTED Final   Escherichia coli NOT DETECTED NOT DETECTED Final   Klebsiella oxytoca NOT DETECTED NOT DETECTED Final   Klebsiella pneumoniae NOT DETECTED NOT DETECTED Final   Proteus species NOT DETECTED NOT DETECTED Final   Serratia marcescens NOT DETECTED NOT DETECTED Final   Carbapenem resistance NOT DETECTED NOT DETECTED Final   Haemophilus influenzae NOT DETECTED NOT DETECTED Final   Neisseria meningitidis NOT DETECTED NOT DETECTED Final   Pseudomonas aeruginosa NOT DETECTED NOT DETECTED Final   Candida albicans NOT DETECTED NOT DETECTED Final   Candida glabrata NOT DETECTED NOT DETECTED Final   Candida krusei NOT DETECTED NOT DETECTED Final   Candida parapsilosis NOT DETECTED NOT DETECTED Final   Candida tropicalis NOT DETECTED NOT DETECTED Final    Comment: Performed at [facility]  Culture, respiratory (NON-Expectorated)     Status: None   Collection Time: 01/03/16  7:52 PM  Result Value Ref Range Status   Specimen Description THROAT  Final   Special Requests NONE  Final   Gram Stain   Final    ABUNDANT WBC PRESENT,BOTH PMN AND MONONUCLEAR RARE GRAM POSITIVE COCCI IN PAIRS RARE GRAM VARIABLE ROD RARE YEAST    Culture   Final    Consistent with normal respiratory flora. Performed at [facility]    Report Status 01/06/2016 FINAL  Final  Culture, blood (routine x 2)     Status: None   Collection Time: 01/03/16  8:45 PM  Result Value Ref Range Status   Specimen Description BLOOD RIGHT ANTECUBITAL  Final   Special Requests BOTTLES DRAWN AEROBIC AND ANAEROBIC 5CC  Final   Culture   Final    NO GROWTH 5 DAYS Performed at [facility]    Report Status 01/08/2016 FINAL  Final  Culture, blood (routine x 2)     Status: None   Collection Time: 01/03/16  8:49 PM  Result Value Ref Range Status   Specimen Description BLOOD RIGHT HAND  Final   Special Requests BOTTLES DRAWN AEROBIC ONLY 5CC  Final   Culture    Final    NO GROWTH 5 DAYS Performed at [facility]    Report Status 01/08/2016 FINAL  Final  Urine culture     Status: None   Collection Time: 01/03/16 11:15 PM  Result Value Ref Range Status   Specimen Description URINE, CATHETERIZED  Final   Special Requests NONE  Final   Culture NO GROWTH Performed at [facility]   Final   Report Status 01/05/2016 FINAL  Final  Respiratory Panel by PCR     Status: None   Collection Time: 01/03/16 11:20 PM  Result Value Ref Range Status   Adenovirus NOT DETECTED NOT DETECTED Final   Coronavirus 229E NOT DETECTED NOT DETECTED Final   Coronavirus HKU1 NOT DETECTED NOT DETECTED Final   Coronavirus NL63 NOT DETECTED NOT DETECTED Final   Coronavirus OC43 NOT DETECTED NOT DETECTED Final   Metapneumovirus NOT DETECTED NOT DETECTED Final   Rhinovirus / Enterovirus NOT DETECTED NOT DETECTED Final   Influenza A NOT DETECTED NOT DETECTED Final   Influenza A H1 NOT DETECTED NOT DETECTED Final   Influenza A H1 2009 NOT DETECTED NOT DETECTED Final   Influenza A H3 NOT DETECTED NOT DETECTED Final   Influenza B NOT DETECTED NOT DETECTED Final   Parainfluenza Virus 1 NOT DETECTED NOT DETECTED Final   Parainfluenza Virus 2 NOT DETECTED NOT DETECTED Final   Parainfluenza Virus 3 NOT DETECTED NOT DETECTED Final   Parainfluenza Virus 4 NOT DETECTED NOT DETECTED Final   Respiratory Syncytial Virus NOT DETECTED NOT DETECTED Final   Bordetella pertussis NOT DETECTED NOT DETECTED Final   Chlamydophila pneumoniae NOT DETECTED NOT DETECTED Final   Mycoplasma pneumoniae NOT DETECTED NOT DETECTED Final    Comment: Performed at [facility]  Culture, respiratory (NON-Expectorated)     Status: None   Collection Time: 01/06/16  9:45 AM  Result Value Ref Range Status   Specimen Description TRACHEAL ASPIRATE  Final   Special Requests Normal  Final   Gram Stain   Final    NO WBC SEEN RARE GRAM POSITIVE COCCI IN PAIRS AND CHAINS RARE YEAST RARE  SQUAMOUS EPITHELIAL CELLS PRESENT    Culture   Final    Consistent with normal respiratory flora. Performed at [facility]    Report Status 01/08/2016 FINAL  Final  Pneumocystis smear by DFA     Status: None   Collection Time: 01/06/16 10:04 AM  Result Value Ref Range Status   Specimen Source-[facility] TRACHEAL ASPIRATE  Final   Pneumocystis jiroveci Ag NEGATIVE  Final    Comment: Performed at [facility]  Culture, blood (Routine X 2) w Reflex to ID Panel     Status: None   Collection Time: 01/06/16 11:37 AM  Result Value Ref Range Status   Specimen Description BLOOD RIGHT HAND  Final   Special Requests BOTTLES DRAWN AEROBIC AND ANAEROBIC  5CC  Final   Culture   Final    NO GROWTH 5 DAYS Performed at [facility]    Report Status 01/11/2016 FINAL  Final  Culture, blood (Routine X 2) w Reflex to ID Panel     Status: None   Collection  Time: 01/06/16 11:44 AM  Result Value Ref Range Status   Specimen Description BLOOD LEFT HAND  Final   Special Requests BOTTLES DRAWN AEROBIC ONLY [facility]  Final   Culture   Final    NO GROWTH 5 DAYS Performed at [facility]    Report Status 01/11/2016 FINAL  Final    Studies/Results: Dg Chest Port 1 View  Result Date: 01/10/2016 CLINICAL DATA:  ARDS EXAM: PORTABLE CHEST 1 VIEW COMPARISON:  01/08/2016 FINDINGS: Support devices are stable. Diffuse interstitial and alveolar opacities within the lungs, not significantly changed. Mild cardiomegaly. Suspect small left effusion. The right effusion. IMPRESSION: Continued diffuse interstitial and alveolar opacities, most confluent at the left base. No change. Small left effusion. Electronically Signed   By: Rolm Baptise M.D.   On: 01/10/2016 09:45  Dg Abd Portable 1v  Result Date: 01/11/2016 CLINICAL DATA:  Vomiting, no bowel movement for several days EXAM: PORTABLE ABDOMEN - 1 VIEW COMPARISON:  01/03/2016 FINDINGS: Nasogastric catheter is noted within the stomach. The  proximal side port appears to lie within the distal esophagus. This could be advanced somewhat. Postsurgical changes are noted in the thoracolumbar spine. Considerable fecal material is noted within the right colon increased from the prior exam. No obstructive changes are noted. IMPRESSION: Considerable fecal material within the right colon consistent with constipation. Nasogastric catheter with proximal side port in the distal esophagus. This could be advanced somewhat. Electronically Signed   By: Inez Catalina M.D.   On: 01/11/2016 09:15     Assessment/Plan: HCAP  =Clinical pictures suggestive of ARDS, possibly multifactorial with infectious cause +/- ILD risk with RA. Continue with broad coverage with linezolid, meropenem ,and anidulafungin day 9 of 10. Will finish up abtx after tomorrow doses  Ileus = continue with NG to help decompress.  Leukocytosis = Steroids causing increased WBC. Continue to monitor  Acute on CKD = continue on CVVH   RA  = continue on solumedrol  Gastritis = likely multifactorial, critical illness and steroids. Continue to follow cbc  Right ear lesion= likely pressure ulcer, will check for viral culture  Not much improvement over the last 5 days, agree with family mtg          Baxter Flattery, [place] Infectious Diseases (pager) [phone] [email or web address] 01/11/2016, 3:07 PM  LOS: 8 days

## 2016-01-11 NOTE — Progress Notes (Signed)
[place] Progress Note Patient Name: Curtis Frazier DOB: Nov 16, 1940 MRN: [id number]   Date of Service  01/11/2016  HPI/Events of Note  RN placed NGT to suction and has drained almost a liter of TF  eICU Interventions  Cont NGT to suction.  Keep NPO.  Check cbg     Intervention Category Evaluation Type: Other  [place] 01/11/2016, 3:03 AM

## 2016-01-12 ENCOUNTER — Inpatient Hospital Stay (HOSPITAL_COMMUNITY): Payer: Medicare Other

## 2016-01-12 ENCOUNTER — Ambulatory Visit: Payer: Medicare Other | Admitting: Cardiology

## 2016-01-12 LAB — GLUCOSE, CAPILLARY
GLUCOSE-CAPILLARY: 104 mg/dL — AB (ref 65–99)
GLUCOSE-CAPILLARY: 107 mg/dL — AB (ref 65–99)
GLUCOSE-CAPILLARY: 92 mg/dL (ref 65–99)
Glucose-Capillary: 81 mg/dL (ref 65–99)
Glucose-Capillary: 147 mg/dL — ABNORMAL HIGH (ref 65–99)
Glucose-Capillary: 164 mg/dL — ABNORMAL HIGH (ref 65–99)

## 2016-01-12 LAB — TYPE AND SCREEN
ABO/RH(D): A NEG
ANTIBODY SCREEN: NEGATIVE

## 2016-01-12 LAB — CBC WITH DIFFERENTIAL/PLATELET
BASOS ABS: 0 10*3/uL (ref 0.0–0.1)
BASOS PCT: 0 %
EOS PCT: 0 %
Eosinophils Absolute: 0 10*3/uL (ref 0.0–0.7)
HCT: 34.4 % — ABNORMAL LOW (ref 39.0–52.0)
Hemoglobin: 10.5 g/dL — ABNORMAL LOW (ref 13.0–17.0)
LYMPHS PCT: 4 %
Lymphs Abs: 0.9 10*3/uL (ref 0.7–4.0)
MCH: 29.5 pg (ref 26.0–34.0)
MCHC: 30.5 g/dL (ref 30.0–36.0)
MCV: 96.6 fL (ref 78.0–100.0)
MONO ABS: 2.3 10*3/uL — AB (ref 0.1–1.0)
Monocytes Relative: 10 %
Neutro Abs: 19.8 10*3/uL — ABNORMAL HIGH (ref 1.7–7.7)
Neutrophils Relative %: 86 %
PLATELETS: 165 10*3/uL (ref 150–400)
RBC: 3.56 MIL/uL — AB (ref 4.22–5.81)
RDW: 18 % — AB (ref 11.5–15.5)
WBC: 23 10*3/uL — AB (ref 4.0–10.5)

## 2016-01-12 LAB — RENAL FUNCTION PANEL
ALBUMIN: 2.2 g/dL — AB (ref 3.5–5.0)
ANION GAP: 5 (ref 5–15)
Albumin: 2.4 g/dL — ABNORMAL LOW (ref 3.5–5.0)
Anion gap: 7 (ref 5–15)
BUN: 25 mg/dL — AB (ref 6–20)
BUN: 24 mg/dL — ABNORMAL HIGH (ref 6–20)
CALCIUM: 8.5 mg/dL — AB (ref 8.9–10.3)
CHLORIDE: 105 mmol/L (ref 101–111)
CO2: 26 mmol/L (ref 22–32)
CO2: 26 mmol/L (ref 22–32)
CREATININE: 1.2 mg/dL (ref 0.61–1.24)
Calcium: 8.3 mg/dL — ABNORMAL LOW (ref 8.9–10.3)
Chloride: 104 mmol/L (ref 101–111)
Creatinine, Ser: 1.5 mg/dL — ABNORMAL HIGH (ref 0.61–1.24)
GFR calc Af Amer: 50 mL/min — ABNORMAL LOW (ref >60)
GFR, EST NON AFRICAN AMERICAN: 43 mL/min — AB (ref >60)
GFR, EST NON AFRICAN AMERICAN: 57 mL/min — AB (ref >60)
GLUCOSE: 96 mg/dL (ref 65–99)
Glucose, Bld: 141 mg/dL — ABNORMAL HIGH (ref 65–99)
PHOSPHORUS: 3.8 mg/dL (ref 2.5–4.6)
POTASSIUM: 4.4 mmol/L (ref 3.5–5.1)
Phosphorus: 3.6 mg/dL (ref 2.5–4.6)
Potassium: 4.5 mmol/L (ref 3.5–5.1)
SODIUM: 137 mmol/L (ref 135–145)
Sodium: 136 mmol/L (ref 135–145)

## 2016-01-12 LAB — PROTIME-INR
INR: 1.75
PROTHROMBIN TIME: 20.6 s — AB (ref 11.4–15.2)

## 2016-01-12 LAB — APTT: aPTT: 34 seconds (ref 24–36)

## 2016-01-12 LAB — MAGNESIUM: MAGNESIUM: 2.5 mg/dL — AB (ref 1.7–2.4)

## 2016-01-12 LAB — ABO/RH: ABO/RH(D): A NEG

## 2016-01-12 MED ORDER — PROPOFOL 1000 MG/100ML IV EMUL: 5.0000 ug/kg/min | Freq: Once | INTRAVENOUS | Status: DC

## 2016-01-12 MED ORDER — HEPARIN 1000 UNIT/ML FOR PERITONEAL DIALYSIS
3000.0000 [IU] | INTRAMUSCULAR | Status: DC | PRN
  Administered 2016-01-12: 2400 [IU] via INTRAVENOUS_CENTRAL
  Filled 2016-01-12 (×2): qty 3

## 2016-01-12 MED ORDER — ETOMIDATE 2 MG/ML IV SOLN
40.0000 mg | Freq: Once | INTRAVENOUS | Status: AC
  Administered 2016-01-13: 20 mg via INTRAVENOUS

## 2016-01-12 MED ORDER — DEXTROSE-NACL 5-0.9 % IV SOLN
INTRAVENOUS | Status: DC
  Administered 2016-01-12 – 2016-01-15 (×3): via INTRAVENOUS

## 2016-01-12 MED ORDER — FENTANYL CITRATE (PF) 100 MCG/2ML IJ SOLN
200.0000 ug | Freq: Once | INTRAMUSCULAR | Status: AC
  Administered 2016-01-13: 100 ug via INTRAVENOUS

## 2016-01-12 MED ORDER — MIDAZOLAM HCL 2 MG/2ML IJ SOLN
4.0000 mg | Freq: Once | INTRAMUSCULAR | Status: AC
  Administered 2016-01-13: 2 mg via INTRAVENOUS

## 2016-01-12 MED ORDER — VECURONIUM BROMIDE 10 MG IV SOLR
10.0000 mg | Freq: Once | INTRAVENOUS | Status: AC
  Administered 2016-01-13: 6 mg via INTRAVENOUS

## 2016-01-12 MED ORDER — SODIUM CHLORIDE 0.9 % IV SOLN: Freq: Once | INTRAVENOUS | Status: DC

## 2016-01-12 NOTE — Progress Notes (Signed)
Nutrition Follow-up  DOCUMENTATION CODES:   Non-severe (moderate) malnutrition in context of acute illness/injury  INTERVENTION:  -RD to continue to monitor -When medically appropriate, restart Vital 1.2 @ 52mL/hr, increase by 10 every 6 hours to goal goal rate of 56mL/hr -Provides 1728 calories, 108gm protein, and 1162mL free water.  NUTRITION DIAGNOSIS:   Inadequate oral intake related to inability to eat as evidenced by NPO status. -ongoing  GOAL:   Patient will meet greater than or equal to 90% of their needs -not meeting with tf held  MONITOR:   Vent status, TF tolerance, Weight trends, Labs, Skin, I & O's  REASON FOR ASSESSMENT:   Ventilator, Malnutrition Screening Tool, Consult Enteral/tube feeding initiation and management  ASSESSMENT:   75 year old male with PMH as below which is significant for Diastolic CHF, CKD, COPD, PAF (s/p recent ablation), and rheumatoid arthritis (recently on remicade, now on chronic pred 5mg ). He has several recent hospitalizations. Most recently he was admitted 7/15 for presumed HCAP. He was streated with 9 days of HCAP antibiotics and was transferred to [facility] 7/22. 7/24 he was noted to have AMS to the point where he was essentially comatose. O2 sats noted to be about 60% on room air at that time. He was transported to [facility] ED for further evaluation where he was placed on BiPAP with some improvement. CXR was performed which is consistent with interstitial lung changes noted on very recent CT.  8/2  Patient is currently intubated on ventilator support MV: 11.6 L/min Temp (24hrs), Avg:98.6 F (37 C), Min:97.2 F (36.2 C), Max:99.4 F (37.4 C) Propofol: None  Continues on CRRT - wt stable TF still held, no plans to be restarted at this time. Slowly weaning from Vent. Had a small BM last night - NGT output of about 2L yesterday but per RN significantly less last night and today, small amounts that are bloody now. Neo held this  morning  Labs and Medications reviewed: Fentanyl Drip, Zofran PRN, Versed PRN, Senokot, Solumedrol D5 @ 8mL/hr --> 163 calories  8/1 Patient is currently intubated on ventilator support MV: 9.7 L/min Temp (24hrs), Avg:99.1 F (37.3 C), Min:98.8 F (37.1 C), Max:99.3 F (37.4 C) Propofol: none  -Needs re-estimated -OGT to suction 1200cc return -TF held, reglan ordered  7/31 Patient is currently intubated on ventilator support MV: 8L/min Temp (24hrs), Avg:98.1 F (36.7 C), Min:97.5 F (36.4 C), Max:98.8 F (37.1 C) Propofol: none  - Pt has been weaned from pressors, weaning from vent some.  - CRRT continues, fluid stable. - Tolerating TF @ 60 - Receiving 2248cc free water at this time with NS @ 45mL/hr and TF @ 60.  - 100cc urine over last 24 hours.   7/28 - Pt continues on CVVHD (day #4) with OGT in place.  - Currently receiving TF at goal rate per order: Vital 1.2 @ 55 mL/hr with 30 mL Prostat once/day which is providing 1684 kcal, 114 grams of protein, and 1071 mL free water.   Patient is currently intubated on ventilator support MV: 11.3L/min Temp (24hrs), Avg:98.4 F (36.9 C), Min:97 F (36.1 C), Max:99 F (37.2 C) Propofol: none  - Estimated kcal needs updated this AM; continue to use admission weight of 71.1 kg. - Nephrology note indicates pt volume now WDL/no fluid overload.  - Notes also indicated possible STEMI overnight which has now been ruled-out.   Diet Order:  Diet NPO time specified Diet NPO time specified  Skin:  Wound (see comment) (L ear  bruising)  Last BM:  PTA  Height:   Ht Readings from Last 1 Encounters:  01/05/16 5\' 8"  (1.727 m)    Weight:   Wt Readings from Last 1 Encounters:  01/12/16 156 lb 12 oz (71.1 kg)    Ideal Body Weight:  70 kg (kg)  BMI:  Body mass index is 23.83 kg/m.  Estimated Nutritional Needs:   Kcal:  [id number]  Protein:  101-115 grams (1.5-1.7 grams/kg)  Fluid:  >/= 1.6 L/day  EDUCATION  NEEDS:   No education needs identified at this time  Satira Anis. Alonnie Bieker, MS, RD LDN Inpatient Clinical Dietitian Pager [phone]

## 2016-01-12 NOTE — Progress Notes (Signed)
Upon walking into pt room to draw labs, it was noticed that patient subclavian CVC appears to be partially removed. Line was immediately saline locked with all medications transferred to purple port on HD catheter. [facility] MD was notified of line status. Awaiting MD response.

## 2016-01-12 NOTE — Progress Notes (Signed)
eLink Physician-Brief Progress Note Patient Name: KAYLEB BAGGARLY DOB: June 06, 1941 MRN: [id number]   Date of Service  01/12/2016  HPI/Events of Note  CXR reviewed, left subclavian CVL pulled out too far for use  eICU Interventions  Remove CVL, place peripheral IV     Intervention Category Minor Interventions: Routine modifications to care plan (e.g. PRN medications for pain, fever)  Simonne Maffucci 01/12/2016, 6:15 PM

## 2016-01-12 NOTE — Progress Notes (Signed)
[place] Progress Note Patient Name: Curtis Frazier DOB: Jun 15, 1940 MRN: [id number]   Date of Service  01/12/2016  HPI/Events of Note  Central line pulled back  eICU Interventions  CXR     Intervention Category Minor Interventions: Routine modifications to care plan (e.g. PRN medications for pain, fever)  Simonne Maffucci 01/12/2016, 5:45 PM

## 2016-01-12 NOTE — Progress Notes (Signed)
Central line update: Per MD secure CVC and order will be added for Chest Xray. Will Call MD upon completion of xray. Catheter Secured.

## 2016-01-12 NOTE — Progress Notes (Signed)
PULMONARY / CRITICAL CARE MEDICINE   Name: Curtis Frazier MRN: [id number] DOB: 14-Aug-1940    ADMISSION DATE:  01/03/2016 CONSULTATION DATE:  01/03/2016  REFERRING MD:  Dr. Dayna Barker EDP  CHIEF COMPLAINT:  SOB/AMS  HISTORY:  75 yo male presented with lethargy, hypoxia, hypotension from ARDS with concern for HCAP versus acute pneumonitis with hx of rheumatoid arthritis..  SUBJECTIVE:   Had small BM last night.  Off pressors this AM.  VITAL SIGNS: BP (!) 141/66 (BP Location: Right Arm)   Pulse (!) 104   Temp 98.8 F (37.1 C) (Core (Comment))   Resp 13   Ht 5\' 8"  (1.727 m)   Wt 156 lb 12 oz (71.1 kg)   SpO2 98%   BMI 23.83 kg/m   HEMODYNAMICS: CVP:  [7 mmHg-16 mmHg] 15 mmHg  VENTILATOR SETTINGS: Vent Mode: PRVC FiO2 (%):  [40 %] 40 % Set Rate:  [18 bmp] 18 bmp Vt Set:  [500 mL] 500 mL PEEP:  [5 cmH20-8 cmH20] 5 cmH20 Plateau Pressure:  [10 cmH20-16 cmH20] 10 cmH20  INTAKE / OUTPUT: I/O last 3 completed shifts: In: 3863.9 [I.V.:2251.9; Other:22; NG/GT:360; IV [id number] Out: 3608 [Urine:14; Emesis/NG output:2300; [id number]  PHYSICAL EXAMINATION: General: remains on CRRT Neuro: follows simple commands HEENT: ETT in place Cardiac: regular, tachycardic Chest: scattered rhonchi Abd: soft, decreased distention and tympany Ext: no edema Skin: ulcer on Rt ear  LABS:  BMET  Recent Labs Lab 01/11/16 0445 01/11/16 1620 01/12/16 0430  NA 136 138 136  K 4.1 4.2 4.4  CL 103 106 105  CO2 27 27 26   BUN 35* 27* 25*  CREATININE 1.47* 1.45* 1.50*  GLUCOSE 59* 80 96    Electrolytes  Recent Labs Lab 01/10/16 0444  01/11/16 0445 01/11/16 1620 01/12/16 0430  CALCIUM 8.6*  < > 8.4* 8.1* 8.3*  MG 2.5*  --  2.6*  --  2.5*  PHOS 4.1  < > 3.7 2.9 3.8  < > = values in this interval not displayed.  CBC  Recent Labs Lab 01/10/16 0444 01/11/16 0445 01/12/16 0430  WBC 19.4* 20.7* 23.0*  HGB 11.4* 11.1* 10.5*  HCT 37.9* 36.7* 34.4*  PLT 189 202 165     Coag's  Recent Labs Lab 01/10/16 0444 01/11/16 0445 01/12/16 0430  APTT 41* 35 34  INR 2.29 1.87 1.75    Sepsis Markers No results for input(s): LATICACIDVEN, PROCALCITON, O2SATVEN in the last 168 hours.  ABG  Recent Labs Lab 01/07/16 1155 01/08/16 0336 01/09/16 0336  PHART 7.292* 7.249* 7.236*  PCO2ART 52.2* 61.0* 62.9*  PO2ART 64.8* 71.7* 73.8*    Liver Enzymes  Recent Labs Lab 01/11/16 0445 01/11/16 1620 01/12/16 0430  ALBUMIN 2.3* 2.1* 2.2*    Cardiac Enzymes  Recent Labs Lab 01/09/16 1210 01/09/16 1800 01/10/16 0022  TROPONINI 0.03* 0.03* 0.04*    Glucose  Recent Labs Lab 01/11/16 1444 01/11/16 1537 01/11/16 1940 01/11/16 2335 01/12/16 0405 01/12/16 0820  GLUCAP 71 74 81 107* 92 81    Imaging Dg Chest Port 1 View  Result Date: 01/12/2016 CLINICAL DATA:  Respiratory failure, intubated patient EXAM: PORTABLE CHEST 1 VIEW COMPARISON:  Portable chest x-ray of January 10, 2016 FINDINGS: The lungs are reasonably well inflated. Diffuse interstitial and alveolar opacities persist but are slightly less conspicuous overall. The cardiac silhouette is top-normal in size. The pulmonary vascularity is less engorged and more distinct. The endotracheal tube tip lies 4.8 cm above the carina. The esophagogastric tube tip in proximal port lie  in the gastric cardia. The right internal jugular Cordis sheath tip projects over the proximal SVC. The left subclavian or PICC line catheter tips project over the midportion of the SVC. Stable moderate dextrocurvature with posterior fusion appliances in the lower thoracic spine. IMPRESSION: Slight interval improvement in bilateral interstitial and alveolar opacities. This may reflect decreasing pneumonia or pulmonary edema. The pulmonary vascularity is less engorged and more distinct. The support tubes are in stable position. Advancement of the nasogastric tube by 10 cm would assure that the proximal port remains below the GE  junction. Electronically Signed   By: David  Martinique M.D.   On: 01/12/2016 07:01     STUDIES:  7/15 CTA Chest >> b/l GGO and interlobular septal thickening, bibasilar consolidation, mediastinal LAN 7/26 Echo >> mild LVH, EF 55 to [id number], grade 1 diastolic CHF, mod AS, mod TR, severe LA dilation, PAS 40 mmHg   SIGNIFICANT MICROBIOLOGY: Respiratory Viral Panel PCR 7/15:  Negative  Blood Ctx x4 7/24: 1/4 Coag Neg Staph Tracheal Asp Ctx 7/24:  Oral Flora Respiratory Viral Panel PCR 7/24:  Negative  Tracheal Asp Ctx 7/27: Normal respiratory flora  ANTIBIOTICS: Aztreonam 7/24 >7/25 Imipenem 7/24 - 7/25 Vancomycin 7/24 > 7/25 Levaquin 7/25 >> 8/1 Meropenem 7/25 >> Linezolid 7/25 >>  Anidulofungin 7/27 >>   SIGNIFICANT EVENTS: 7/22 - discharge to SNF 7/24 - Admit, intubated for respiratory failure  7/25 - worsening acidosis, off pressors, ARDS protocol, CVVHD initiated 7/26 - Nimbex discontinued  7/30 - hypotension with sedation, vasopressors restarted, ST segment changes  7/31 - vomiting overnight, NGT to suction 8/02 - off pressors  LINES/TUBES: ETT 8.5 7/24 >> L Subclavian CVL 7/25 >>  R IJ HD CVL 7/25 >>  DISCUSSION: 75 year old male with baseline RA and COPD and a recent admission for acute respiratory failure with hypoxemia was admitted on 7/24 with ARDS most likely due to HCAP but there has been question of underlying ILD related to his rheumatoid arthritis.  Has multi-organ failure.  Off ARDS protocol, CVVH on going.    ASSESSMENT / PLAN:  PULMONARY A: Acute hypoxic respiratory failure from ARDS with HCAP versus acute pneumonitis in setting of RA. Hx of COPD with emphysema. Hx of OSA. P:   Pressure support wean as tolerated For tracheostomy with Dr. [name] 8/03 Continue solumedrol for pneumonitis Scheduled BDs  CARDIOVASCULAR A:  Abnormal EKG >> ST elevation RCA distribution, resolved spontaneously without hemodynamic change, uncertain etiology. Seen on 7/28 &  again 7/30. Suspect demand with hypotension in setting of moderate aortic stenosis. Shock - Likely sedation & CVVHD related.  Hx of CAD, Carotid artery disease, chronic diastolic CHF, HTN, a fib s/p ablation, HLD, mod AS. P:  Monitor hemodynamics Hold ASA with concern about gastritis  RENAL A:   AKI. Hx of CKD 3. P:   CRRT per nephrology  GASTROINTESTINAL A:   Severe constipation noted 8/01. Gastritis. P:   NPO Hold Tube Feeds  Reglan 5mg  Q8 x 3 doses  Pepcid IV q24hr Senna bid VT PRN zofran Dulcolax prn  HEMATOLOGIC A:   Coagulopathy - Secondary to Coumadin. Anemia of critical illness and chronic disease. P:  F/u CBC, INR  INFECTIOUS A:   HCAP. Staph in blood culture >> likely contaminate. Candidal scrotal rash. Pressure ulcer Rt ear. P:   Abx per ID  ENDOCRINE A:   DM type II. P:   SSI  NEUROLOGIC A:   Acute metabolic encephalopathy. P:   RASS goal 0 to -1  CC time 34 minutes.  Chesley Mires, MD [facility] Pulmonary/Critical Care 01/12/2016, 10:22 AM Pager:  [phone] After 3pm call: [phone]

## 2016-01-12 NOTE — Progress Notes (Signed)
[facility] Progress Note   Subjective: Pressors now off- attempting some vent weaning as well but will be for trach 8/3 - CRRT running well - very minimal  uop.  Less NGT output   Vitals:   01/12/16 1100 01/12/16 1200 01/12/16 1202 01/12/16 1300  BP: 138/70 90/63 90/63  (!) 164/71  Pulse:   93   Resp: 17 18 (!) 24 15  Temp: 98.6 F (37 C)     TempSrc: Core (Comment)     SpO2: 100% 99% 99% 98%  Weight:      Height:        Inpatient medications: . anidulafungin  100 mg Intravenous Q24H  . antiseptic oral rinse  7 mL Mouth Rinse QID  . chlorhexidine gluconate (SAGE KIT)  15 mL Mouth Rinse BID  . etomidate  40 mg Intravenous Once  . famotidine (PEPCID) IV  20 mg Intravenous Q12H  . fentaNYL (SUBLIMAZE) injection  200 mcg Intravenous Once  . heparin subcutaneous  5,000 Units Subcutaneous Q8H  . insulin aspart  0-15 Units Subcutaneous Q4H  . ipratropium-albuterol  3 mL Nebulization Q6H  . linezolid (ZYVOX) IV  600 mg Intravenous Q12H  . meropenem (MERREM) IV  1 g Intravenous Q12H  . methylPREDNISolone (SOLU-MEDROL) injection  40 mg Intravenous Daily  . midazolam  4 mg Intravenous Once  . nystatin  5 mL Oral TID  . nystatin   Topical TID  . propofol  5-80 mcg/kg/min Intravenous Once  . sennosides  5 mL Per Tube Q12H  . sodium chloride flush  10-40 mL Intracatheter Q12H  . vecuronium  10 mg Intravenous Once   . dextrose 5 % and 0.9% NaCl 40 mL/hr at 01/12/16 1300  . fentaNYL infusion INTRAVENOUS 150 mcg/hr (01/12/16 1304)  . phenylephrine (NEO-SYNEPHRINE) Adult infusion Stopped (01/12/16 0700)  . dialysis replacement fluid (prismasate) 400 mL/hr at 01/12/16 0054  . dialysis replacement fluid (prismasate) 200 mL/hr at 01/11/16 0129  . dialysate (PRISMASATE) 2,000 mL/hr at 01/12/16 1114   sodium chloride, albuterol, alteplase, fentaNYL, ipratropium, midazolam, ondansetron (ZOFRAN) IV, sodium chloride flush  Exam: Gen on vent, eyes open, no commands  Sclera  anicteric, throat w ETT-  Right IJ vascath placed 7/25 No jvd or bruits Chest some post crackles at bases RRR no MRG Abd soft nondistended no ascites or mass GU normal male w Foley Ext no LE or UE edema Neuro as above  CT chest > underlying emphysema w bilat patchy infiltrates/ ground glass changes, LL > UL's CXR 7.25 > patchy bilat infiltrates UA - negative CVP 10-12  Assessment: 1  Acute on CRF3 - baseline creatinine in the high 1's- ischemic ATN due to shock most likely, CRRT D#8.  Very minimal  of urine last 24 hours so likely  not able to liberate completely from CRRT, however, would like to give a CRRT holiday to see if UOP improves and now seems like a reasonable time since numbers so good- no heparin- all 4 K dialysate.  Have discussed with nursing to hold treatment when time for filter change (should be in next 12 hours) 2  Recurrent resp failure - ARDS + prob HCAP- per CCM- making some positive movements but now I see for trach on 8/3 3  Volume - CVP variable, stable wts, no vol excess- running even-  give CCM license to bolus outside machinel as I do not think is wet 4  COPD underlying 5  Hx RA/ psor arthriitis/ PMR - chron immunosuppressed for yrs (  methotrexate, Remicade/ Humira, pred) 6. Acidosis- improved 7. [place]tes- stable     Karalee Hauter A  01/12/2016, 1:39 PM    Recent Labs Lab 01/11/16 0445 01/11/16 1620 01/12/16 0430  NA 136 138 136  K 4.1 4.2 4.4  CL 103 106 105  CO2 27 27 26   GLUCOSE 59* 80 96  BUN 35* 27* 25*  CREATININE 1.47* 1.45* 1.50*  CALCIUM 8.4* 8.1* 8.3*  PHOS 3.7 2.9 3.8    Recent Labs Lab 01/11/16 0445 01/11/16 1620 01/12/16 0430  ALBUMIN 2.3* 2.1* 2.2*    Recent Labs Lab 01/10/16 0444 01/11/16 0445 01/12/16 0430  WBC 19.4* 20.7* 23.0*  NEUTROABS 18.1* 17.8* 19.8*  HGB 11.4* 11.1* 10.5*  HCT 37.9* 36.7* 34.4*  MCV 97.9 97.6 96.6  PLT 189 202 165   Iron/TIBC/Ferritin/ %Sat    Component Value Date/Time   IRON 24  (L) 08/13/2015 0430   TIBC 258 08/13/2015 0430   FERRITIN 147 08/13/2015 0430   IRONPCTSAT 9 (L) 08/13/2015 0430

## 2016-01-13 ENCOUNTER — Inpatient Hospital Stay (HOSPITAL_COMMUNITY): Payer: Medicare Other

## 2016-01-13 DIAGNOSIS — M069 Rheumatoid arthritis, unspecified: Secondary | ICD-10-CM

## 2016-01-13 DIAGNOSIS — Z9911 Dependence on respirator [ventilator] status: Secondary | ICD-10-CM

## 2016-01-13 DIAGNOSIS — D72829 Elevated white blood cell count, unspecified: Secondary | ICD-10-CM

## 2016-01-13 DIAGNOSIS — Z930 Tracheostomy status: Secondary | ICD-10-CM

## 2016-01-13 DIAGNOSIS — R0600 Dyspnea, unspecified: Secondary | ICD-10-CM

## 2016-01-13 DIAGNOSIS — L989 Disorder of the skin and subcutaneous tissue, unspecified: Secondary | ICD-10-CM

## 2016-01-13 DIAGNOSIS — Z01818 Encounter for other preprocedural examination: Secondary | ICD-10-CM

## 2016-01-13 DIAGNOSIS — K922 Gastrointestinal hemorrhage, unspecified: Secondary | ICD-10-CM

## 2016-01-13 DIAGNOSIS — Y95 Nosocomial condition: Secondary | ICD-10-CM

## 2016-01-13 LAB — RENAL FUNCTION PANEL
ALBUMIN: 2.2 g/dL — AB (ref 3.5–5.0)
Anion gap: 6 (ref 5–15)
BUN: 46 mg/dL — AB (ref 6–20)
CALCIUM: 8.8 mg/dL — AB (ref 8.9–10.3)
CHLORIDE: 105 mmol/L (ref 101–111)
CO2: 25 mmol/L (ref 22–32)
CREATININE: 2.35 mg/dL — AB (ref 0.61–1.24)
GFR, EST AFRICAN AMERICAN: 29 mL/min — AB (ref >60)
GFR, EST NON AFRICAN AMERICAN: 25 mL/min — AB (ref >60)
Glucose, Bld: 161 mg/dL — ABNORMAL HIGH (ref 65–99)
PHOSPHORUS: 4.9 mg/dL — AB (ref 2.5–4.6)
Potassium: 4.7 mmol/L (ref 3.5–5.1)
SODIUM: 136 mmol/L (ref 135–145)

## 2016-01-13 LAB — CBC WITH DIFFERENTIAL/PLATELET
BASOS PCT: 0 %
Basophils Absolute: 0 10*3/uL (ref 0.0–0.1)
EOS PCT: 0 %
Eosinophils Absolute: 0 10*3/uL (ref 0.0–0.7)
HEMATOCRIT: 32.6 % — AB (ref 39.0–52.0)
Hemoglobin: 10.3 g/dL — ABNORMAL LOW (ref 13.0–17.0)
LYMPHS ABS: 1.6 10*3/uL (ref 0.7–4.0)
Lymphocytes Relative: 7 %
MCH: 29.8 pg (ref 26.0–34.0)
MCHC: 31.6 g/dL (ref 30.0–36.0)
MCV: 94.2 fL (ref 78.0–100.0)
MONO ABS: 1.2 10*3/uL — AB (ref 0.1–1.0)
MONOS PCT: 5 %
NEUTROS ABS: 20.3 10*3/uL — AB (ref 1.7–7.7)
Neutrophils Relative %: 88 %
PLATELETS: 160 10*3/uL (ref 150–400)
RBC: 3.46 MIL/uL — AB (ref 4.22–5.81)
RDW: 18.2 % — AB (ref 11.5–15.5)
WBC: 23.1 10*3/uL — AB (ref 4.0–10.5)

## 2016-01-13 LAB — GLUCOSE, CAPILLARY
GLUCOSE-CAPILLARY: 156 mg/dL — AB (ref 65–99)
GLUCOSE-CAPILLARY: 128 mg/dL — AB (ref 65–99)
Glucose-Capillary: 193 mg/dL — ABNORMAL HIGH (ref 65–99)
Glucose-Capillary: 121 mg/dL — ABNORMAL HIGH (ref 65–99)
Glucose-Capillary: 159 mg/dL — ABNORMAL HIGH (ref 65–99)
Glucose-Capillary: 225 mg/dL — ABNORMAL HIGH (ref 65–99)

## 2016-01-13 LAB — MAGNESIUM: MAGNESIUM: 2.6 mg/dL — AB (ref 1.7–2.4)

## 2016-01-13 LAB — APTT: APTT: 35 s (ref 24–36)

## 2016-01-13 LAB — PROTIME-INR
INR: 1.8
Prothrombin Time: 21.2 seconds — ABNORMAL HIGH (ref 11.4–15.2)

## 2016-01-13 MED ORDER — SODIUM CHLORIDE 0.9 % IV BOLUS (SEPSIS): 500.0000 mL | Freq: Once | INTRAVENOUS | Status: DC

## 2016-01-13 MED ORDER — FENTANYL CITRATE (PF) 100 MCG/2ML IJ SOLN
INTRAMUSCULAR | Status: AC
  Filled 2016-01-13: qty 4

## 2016-01-13 MED ORDER — FENTANYL CITRATE (PF) 100 MCG/2ML IJ SOLN
25.0000 ug | INTRAMUSCULAR | Status: DC | PRN
  Administered 2016-01-14: 50 ug via INTRAVENOUS
  Administered 2016-01-14: 25 ug via INTRAVENOUS

## 2016-01-13 NOTE — Procedures (Signed)
Bronchoscopy  for Percutaneous  Tracheostomy  Name: Curtis Frazier MRN: [id number] DOB: June 19, 1940 Procedure: Bronchoscopy for Percutaneous Tracheostomy Indications: Diagnostic evaluation of the airways In conjunction with: Dr. [name]   Procedure Details Consent: Risks of procedure as well as the alternatives and risks of each were explained to the (patient/caregiver).  Consent for procedure obtained. Time Out: Verified patient identification, verified procedure, site/side was marked, verified correct patient position, special equipment/implants available, medications/allergies/relevent history reviewed, required imaging and test results available.  Performed  In preparation for procedure, patient was given 100% FiO2 and bronchoscope lubricated. Sedation: Benzodiazepines, Muscle relaxants and Etomidate  Airway entered and the following bronchi were examined: RML.   Procedures performed: Endotracheal Tube retracted in 2 cm increments. Cannulation of airway observed. Dilation observed. Placement of trachel tube  observed . No overt complications. Bronchoscope removed.    Evaluation Hemodynamic Status: BP stable throughout; O2 sats: stable throughout Patient's Current Condition: stable Specimens:  None Complications: No apparent complications Patient did tolerate procedure well.   Richardson Landry Minor ACNP [facility] Shape PCCM Pager [phone] till 3 pm If no answer page [phone] 01/13/2016, 7:58 AM

## 2016-01-13 NOTE — Procedures (Signed)
Name:  Curtis Frazier MRN:  [id number] DOB:  [birth year]  OPERATIVE NOTE  Procedure:  Percutaneous tracheostomy.  Indications:  Ventilator-dependent respiratory failure.  Consent:  Procedure, alternatives, risks and benefits discussed with medical POA.  Questions answered.  Consent obtained.  Anesthesia:  Vec, etomidate, fent, versed  Procedure summary:  Appropriate equipment was assembled.  The patient was identified as [name] and safety timeout was performed. The patient was placed in supine position with a towel roll behind shoulder blades and neck extended.  Sterile technique was used. The patient's neck and upper chest were prepped using chlorhexidine / alcohol scrub and the field was draped in usual sterile fashion with full body drape. After the adequate sedation / anesthesia was achieved, attention was directed at the midline trachea, where the cricothyroid membrane was palpated. Approximately two fingerbreadths above the sternal notch, a verticle incision was created with a scalpel after local infiltration with 0.2% Lidocaine. Then, using Seldinger technique and a percutaneous tracheostomy set, the trachea was entered with a 14 gauge needle with an overlying sheath. This was all confirmed under direct visualization of a fiberoptic flexible bronchoscope. Entrance into the trachea was identified through the third tracheal ring interspace. Following this, a guidewire was inserted. The needle was removed, leaving the sheath and the guidewire intact. Next, the sheath was removed and a small dilator was inserted. The tracheal rings were then dilated. A #8 Shiley was then opened. The balloon was checked. It was placed over a tracheal dilator, which was then advanced over the guidewire and through the previously dilated tract. The Shiley tracheostomy tube was noted to pass in the trachea with little resistance. The guidewire and dilator tubes were removed from the trachea. An inner cannula was placed  through the tracheostomy tube. The tracheostomy was then secured at the anterior neck with 4 monofilament sutures. The oral endotracheal tube was removed and the ventilator was attached to the newly placed tracheostomy tube. Adequate tidal volumes were noted. The cuff was inflated and no evidence of air leak was noted. No evidence of bleeding was noted. At this point, the procedure was concluded. Post-procedure chest x-ray was ordered.  Complications:  No immediate complications were noted.  Hemodynamic parameters and oxygenation remained stable throughout the procedure.  Estimated blood loss:  Less then 0.5 mL.  [name]., MD Pulmonary and [place] Pager: [phone]  01/13/2016, 7:56 AM   Should follow up in our [facility] trach clinic [phone]

## 2016-01-13 NOTE — Progress Notes (Signed)
Post tracheostomy procedure, per MD order, a new nasogastric tube was inserted into the patient's stomach and put to low intermittent wall suction. This was done around 1100 this morning. In the last few hours the patient has produced 275cc of coffee ground drainage. Pt has a tender abdomen and the abdomen continues to appear distended. [facility] notified of the information above. No new orders. Pt to remain NPO. VSS. Will continue to monitor.

## 2016-01-13 NOTE — Progress Notes (Signed)
INFECTIOUS DISEASE PROGRESS NOTE  ID: Curtis Frazier is a 75 y.o. male with  Active Problems:   Acute respiratory failure with hypoxemia ([place])   Visit for intubation   ARDS (adult respiratory distress syndrome) (HCC)   Malnutrition of moderate degree   abtx say #11 : linezolid #11/meropenem #11/anidula #9  Subjective: Afebrile, underwent trach without difficulty. Off of CRRT for the time being, having improved UOP. Still having blood tinged gastric secretions noted in NGT  Abtx:  Anti-infectives    Start     Dose/Rate Route Frequency Ordered Stop   01/07/16 1700  anidulafungin (ERAXIS) 100 mg in sodium chloride 0.9 % 100 mL IVPB     100 mg over 90 Minutes Intravenous Every 24 hours 01/06/16 1537     01/06/16 1630  anidulafungin (ERAXIS) 200 mg in sodium chloride 0.9 % 200 mL IVPB     200 mg over 180 Minutes Intravenous  Once 01/06/16 1535 01/06/16 1954   01/05/16 2200  meropenem (MERREM) 1 g in sodium chloride 0.9 % 100 mL IVPB     1 g 200 mL/hr over 30 Minutes Intravenous Every 12 hours 01/05/16 1023     01/05/16 1030  meropenem (MERREM) 500 mg in sodium chloride 0.9 % 50 mL IVPB     500 mg 100 mL/hr over 30 Minutes Intravenous STAT 01/05/16 1023 01/05/16 1125   01/05/16 0100  meropenem (MERREM) 500 mg in sodium chloride 0.9 % 50 mL IVPB  Status:  Discontinued     500 mg 100 mL/hr over 30 Minutes Intravenous Every 8 hours 01/04/16 1620 01/05/16 1023   01/04/16 2200  linezolid (ZYVOX) IVPB 600 mg     600 mg 300 mL/hr over 60 Minutes Intravenous Every 12 hours 01/04/16 1555     01/04/16 2000  vancomycin (VANCOCIN) IVPB 1000 mg/200 mL premix  Status:  Discontinued     1,000 mg 200 mL/hr over 60 Minutes Intravenous Every 24 hours 01/03/16 2016 01/04/16 0730   01/04/16 2000  vancomycin (VANCOCIN) IVPB 750 mg/150 ml premix  Status:  Discontinued     750 mg 150 mL/hr over 60 Minutes Intravenous Every 24 hours 01/04/16 0730 01/04/16 1555   01/04/16 1800  imipenem-cilastatin  (PRIMAXIN) 500 mg in sodium chloride 0.9 % 100 mL IVPB  Status:  Discontinued     500 mg 200 mL/hr over 30 Minutes Intravenous Every 12 hours 01/04/16 0730 01/04/16 1555   01/04/16 1700  levofloxacin (LEVAQUIN) IVPB 500 mg  Status:  Discontinued     500 mg 100 mL/hr over 60 Minutes Intravenous Every 24 hours 01/04/16 1555 01/11/16 0843   01/04/16 1700  meropenem (MERREM) 1 g in sodium chloride 0.9 % 100 mL IVPB     1 g 200 mL/hr over 30 Minutes Intravenous  Once 01/04/16 1619 01/04/16 1712   01/04/16 0400  imipenem-cilastatin (PRIMAXIN) 500 mg in sodium chloride 0.9 % 100 mL IVPB  Status:  Discontinued     500 mg 200 mL/hr over 30 Minutes Intravenous Every 8 hours 01/03/16 2016 01/04/16 0730   01/03/16 2000  imipenem-cilastatin (PRIMAXIN) 500 mg in sodium chloride 0.9 % 100 mL IVPB     500 mg 200 mL/hr over 30 Minutes Intravenous STAT 01/03/16 1953 01/03/16 2114   01/03/16 1830  vancomycin (VANCOCIN) IVPB 1000 mg/200 mL premix     1,000 mg 200 mL/hr over 60 Minutes Intravenous  Once 01/03/16 1819 01/03/16 2043      Medications:  Scheduled: . sodium chloride  Intravenous Once  . anidulafungin  100 mg Intravenous Q24H  . antiseptic oral rinse  7 mL Mouth Rinse QID  . chlorhexidine gluconate (SAGE KIT)  15 mL Mouth Rinse BID  . famotidine (PEPCID) IV  20 mg Intravenous Q12H  . heparin subcutaneous  5,000 Units Subcutaneous Q8H  . insulin aspart  0-15 Units Subcutaneous Q4H  . ipratropium-albuterol  3 mL Nebulization Q6H  . linezolid (ZYVOX) IV  600 mg Intravenous Q12H  . meropenem (MERREM) IV  1 g Intravenous Q12H  . methylPREDNISolone (SOLU-MEDROL) injection  40 mg Intravenous Daily  . nystatin  5 mL Oral TID  . nystatin   Topical TID  . propofol  5-80 mcg/kg/min Intravenous Once  . sennosides  5 mL Per Tube Q12H  . sodium chloride  500 mL Intravenous Once  . sodium chloride flush  10-40 mL Intracatheter Q12H    Objective: Vital signs in last 24 hours: Temp:  [99.1 F (37.3  C)-99.7 F (37.6 C)] 99.1 F (37.3 C) (08/03 0728) Pulse Rate:  [85-106] 86 (08/03 0600) Resp:  [12-53] 24 (08/03 1400) BP: (69-192)/(28-94) 97/43 (08/03 1400) SpO2:  [86 %-99 %] 96 % (08/03 1400) FiO2 (%):  [30 %-50 %] 30 % (08/03 1400) Weight:  [154 lb 15.7 oz (70.3 kg)] 154 lb 15.7 oz (70.3 kg) (08/03 0400) Physical Exam  Constitutional: He is intubated/sedated. He appears chronically ill and well-nourished. No distress.  HENT: OETT in place,  Cardiovascular: Normal rate, regular rhythm and normal heart sounds. Exam reveals no gallop and no friction rub.  No murmur heard.  Pulmonary/Chest: Effort normal and breath sounds normal. No respiratory distress. He has no wheezes.  Abdominal: distended. absent bowel sounds Skin: Skin is warm and dry. No rash noted. No erythema. Right ear pinna abrasion c/w pressure ulcer, no purulent drainage  Lab Results  Recent Labs  01/12/16 0430 01/12/16 1537 01/13/16 0330  WBC 23.0*  --  23.1*  HGB 10.5*  --  10.3*  HCT 34.4*  --  32.6*  NA 136 137 136  K 4.4 4.5 4.7  CL 105 104 105  CO2 26 26 25   BUN 25* 24* 46*  CREATININE 1.50* 1.20 2.35*   Liver Panel  Recent Labs  01/12/16 1537 01/13/16 0330  ALBUMIN 2.4* 2.2*   Microbiology: Trach Cx "resp flora" (unfortunately his BAL did not make it to lab) BCx 1/2 CNS (contaminant), UCx negative. Resp virus panel (-) ur leg (-) CXR improved from 1 week ago, though still has right sided infiltrates.  Recent Results (from the past 240 hour(s))  MRSA PCR Screening     Status: None   Collection Time: 01/03/16  5:54 PM  Result Value Ref Range Status   MRSA by PCR NEGATIVE NEGATIVE Final    Comment:        The GeneXpert MRSA Assay (FDA approved for NASAL specimens only), is one component of a comprehensive MRSA colonization surveillance program. It is not intended to diagnose MRSA infection nor to guide or monitor treatment for MRSA infections.   Blood Culture (routine x 2)      Status: None   Collection Time: 01/03/16  6:44 PM  Result Value Ref Range Status   Specimen Description BLOOD LEFT ANTECUBITAL  Final   Special Requests BOTTLES DRAWN AEROBIC AND ANAEROBIC 5CC  Final   Culture   Final    NO GROWTH 5 DAYS Performed at [facility]    Report Status 01/08/2016 FINAL  Final  Blood  Culture (routine x 2)     Status: Abnormal   Collection Time: 01/03/16  6:50 PM  Result Value Ref Range Status   Specimen Description BLOOD LEFT HAND  Final   Special Requests BOTTLES DRAWN AEROBIC AND ANAEROBIC 5CC  Final   Culture  Setup Time   Final    GRAM POSITIVE COCCI IN CLUSTERS AEROBIC BOTTLE ONLY CRITICAL RESULT CALLED TO, READ BACK BY AND VERIFIED WITH: E. JACKSON, [facility] AT [place] ON 01/04/16 BY C. JESSUP, MLT.    Culture (A)  Final    STAPHYLOCOCCUS SPECIES (COAGULASE NEGATIVE) THE SIGNIFICANCE OF ISOLATING THIS ORGANISM FROM A SINGLE SET OF BLOOD CULTURES WHEN MULTIPLE SETS ARE DRAWN IS UNCERTAIN. PLEASE NOTIFY THE MICROBIOLOGY DEPARTMENT WITHIN ONE WEEK IF SPECIATION AND SENSITIVITIES ARE REQUIRED. Performed at [facility]    Report Status 01/05/2016 FINAL  Final  Blood Culture ID Panel (Reflexed)     Status: Abnormal   Collection Time: 01/03/16  6:50 PM  Result Value Ref Range Status   Enterococcus species NOT DETECTED NOT DETECTED Final   Vancomycin resistance NOT DETECTED NOT DETECTED Final   Listeria monocytogenes NOT DETECTED NOT DETECTED Final   Staphylococcus species DETECTED (A) NOT DETECTED Final    Comment: CRITICAL RESULT CALLED TO, READ BACK BY AND VERIFIED WITH: [place], [facility] AT 1840 ON 01/04/16 BY C. JESSUP, MLT.    Staphylococcus aureus NOT DETECTED NOT DETECTED Final   Methicillin resistance NOT DETECTED NOT DETECTED Final   Streptococcus species NOT DETECTED NOT DETECTED Final   Streptococcus agalactiae NOT DETECTED NOT DETECTED Final   Streptococcus pneumoniae NOT DETECTED NOT DETECTED Final   Streptococcus pyogenes  NOT DETECTED NOT DETECTED Final   Acinetobacter baumannii NOT DETECTED NOT DETECTED Final   Enterobacteriaceae species NOT DETECTED NOT DETECTED Final   Enterobacter cloacae complex NOT DETECTED NOT DETECTED Final   Escherichia coli NOT DETECTED NOT DETECTED Final   Klebsiella oxytoca NOT DETECTED NOT DETECTED Final   Klebsiella pneumoniae NOT DETECTED NOT DETECTED Final   Proteus species NOT DETECTED NOT DETECTED Final   Serratia marcescens NOT DETECTED NOT DETECTED Final   Carbapenem resistance NOT DETECTED NOT DETECTED Final   Haemophilus influenzae NOT DETECTED NOT DETECTED Final   Neisseria meningitidis NOT DETECTED NOT DETECTED Final   Pseudomonas aeruginosa NOT DETECTED NOT DETECTED Final   Candida albicans NOT DETECTED NOT DETECTED Final   Candida glabrata NOT DETECTED NOT DETECTED Final   Candida krusei NOT DETECTED NOT DETECTED Final   Candida parapsilosis NOT DETECTED NOT DETECTED Final   Candida tropicalis NOT DETECTED NOT DETECTED Final    Comment: Performed at [facility]  Culture, respiratory (NON-Expectorated)     Status: None   Collection Time: 01/03/16  7:52 PM  Result Value Ref Range Status   Specimen Description THROAT  Final   Special Requests NONE  Final   Gram Stain   Final    ABUNDANT WBC PRESENT,BOTH PMN AND MONONUCLEAR RARE GRAM POSITIVE COCCI IN PAIRS RARE GRAM VARIABLE ROD RARE YEAST    Culture   Final    Consistent with normal respiratory flora. Performed at [facility]    Report Status 01/06/2016 FINAL  Final  Culture, blood (routine x 2)     Status: None   Collection Time: 01/03/16  8:45 PM  Result Value Ref Range Status   Specimen Description BLOOD RIGHT ANTECUBITAL  Final   Special Requests BOTTLES DRAWN AEROBIC AND ANAEROBIC 5CC  Final  Culture   Final    NO GROWTH 5 DAYS Performed at [facility]    Report Status 01/08/2016 FINAL  Final  Culture, blood (routine x 2)     Status: None   Collection Time: 01/03/16   8:49 PM  Result Value Ref Range Status   Specimen Description BLOOD RIGHT HAND  Final   Special Requests BOTTLES DRAWN AEROBIC ONLY 5CC  Final   Culture   Final    NO GROWTH 5 DAYS Performed at [facility]    Report Status 01/08/2016 FINAL  Final  Urine culture     Status: None   Collection Time: 01/03/16 11:15 PM  Result Value Ref Range Status   Specimen Description URINE, CATHETERIZED  Final   Special Requests NONE  Final   Culture NO GROWTH Performed at [facility]   Final   Report Status 01/05/2016 FINAL  Final  Respiratory Panel by PCR     Status: None   Collection Time: 01/03/16 11:20 PM  Result Value Ref Range Status   Adenovirus NOT DETECTED NOT DETECTED Final   Coronavirus 229E NOT DETECTED NOT DETECTED Final   Coronavirus HKU1 NOT DETECTED NOT DETECTED Final   Coronavirus NL63 NOT DETECTED NOT DETECTED Final   Coronavirus OC43 NOT DETECTED NOT DETECTED Final   Metapneumovirus NOT DETECTED NOT DETECTED Final   Rhinovirus / Enterovirus NOT DETECTED NOT DETECTED Final   Influenza A NOT DETECTED NOT DETECTED Final   Influenza A H1 NOT DETECTED NOT DETECTED Final   Influenza A H1 2009 NOT DETECTED NOT DETECTED Final   Influenza A H3 NOT DETECTED NOT DETECTED Final   Influenza B NOT DETECTED NOT DETECTED Final   Parainfluenza Virus 1 NOT DETECTED NOT DETECTED Final   Parainfluenza Virus 2 NOT DETECTED NOT DETECTED Final   Parainfluenza Virus 3 NOT DETECTED NOT DETECTED Final   Parainfluenza Virus 4 NOT DETECTED NOT DETECTED Final   Respiratory Syncytial Virus NOT DETECTED NOT DETECTED Final   Bordetella pertussis NOT DETECTED NOT DETECTED Final   Chlamydophila pneumoniae NOT DETECTED NOT DETECTED Final   Mycoplasma pneumoniae NOT DETECTED NOT DETECTED Final    Comment: Performed at [facility]  Culture, respiratory (NON-Expectorated)     Status: None   Collection Time: 01/06/16  9:45 AM  Result Value Ref Range Status   Specimen Description  TRACHEAL ASPIRATE  Final   Special Requests Normal  Final   Gram Stain   Final    NO WBC SEEN RARE GRAM POSITIVE COCCI IN PAIRS AND CHAINS RARE YEAST RARE SQUAMOUS EPITHELIAL CELLS PRESENT    Culture   Final    Consistent with normal respiratory flora. Performed at [facility]    Report Status 01/08/2016 FINAL  Final  Pneumocystis smear by DFA     Status: None   Collection Time: 01/06/16 10:04 AM  Result Value Ref Range Status   Specimen Source-PJSRC TRACHEAL ASPIRATE  Final   Pneumocystis jiroveci Ag NEGATIVE  Final    Comment: Performed at [facility]  Culture, blood (Routine X 2) w Reflex to ID Panel     Status: None   Collection Time: 01/06/16 11:37 AM  Result Value Ref Range Status   Specimen Description BLOOD RIGHT HAND  Final   Special Requests BOTTLES DRAWN AEROBIC AND ANAEROBIC  5CC  Final   Culture   Final    NO GROWTH 5 DAYS Performed at [facility]  Report Status 01/11/2016 FINAL  Final  Culture, blood (Routine X 2) w Reflex to ID Panel     Status: None   Collection Time: 01/06/16 11:44 AM  Result Value Ref Range Status   Specimen Description BLOOD LEFT HAND  Final   Special Requests BOTTLES DRAWN AEROBIC ONLY [facility]  Final   Culture   Final    NO GROWTH 5 DAYS Performed at [facility]    Report Status 01/11/2016 FINAL  Final    Studies/Results: Dg Chest Port 1 View  Result Date: 01/13/2016 CLINICAL DATA:  Tracheostomy placement EXAM: PORTABLE CHEST 1 VIEW COMPARISON:  January 12, 2016 FINDINGS: Tracheostomy present with tip 5.4 cm above the carina. Central catheter tip is in the superior vena cava. Nasogastric tube no longer present. No pneumothorax. There is patchy atelectatic change in the left base. There is underlying diffuse interstitial prominence, likely due to edema with questionable superimposed pneumonia, stable. Heart is mildly enlarged with mild pulmonary venous hypertension. There is atherosclerotic  calcification in the aorta. There is stable thoracic scoliosis. There is postoperative change in lower thoracic and visualized upper lumbar spine regions. There is right carotid artery calcification. IMPRESSION: Tube and catheter positions as described without pneumothorax. New left base atelectasis. Patchy opacity elsewhere, likely due primarily to pulmonary edema, although there may be superimposed pneumonia, particularly in the right mid lung. Stable cardiac silhouette. Aortic atherosclerosis present. There is calcification in the right carotid artery. Electronically Signed   By: Lowella Grip III M.D.   On: 01/13/2016 08:30   Dg Chest Port 1 View  Result Date: 01/12/2016 CLINICAL DATA:  Central line repositioning. EXAM: PORTABLE CHEST 1 VIEW COMPARISON:  Radiograph of same day. FINDINGS: Stable cardiomediastinal silhouette. Endotracheal and nasogastric tubes are in grossly good position. No pneumothorax or pleural effusion is noted. Mild left lower lobe opacity is noted concerning for atelectasis or infiltrate. Mild right basilar opacity is noted. Right internal jugular catheter is unchanged in position. Left subclavian catheter has been withdrawn significantly, with distal tip now seen in region of left subclavian vein. Bony thorax is unremarkable. IMPRESSION: Left subclavian catheter has been withdrawn significantly, with distal tip now seen in region of left subclavian vein. Otherwise stable support apparatus. Left basilar opacity is noted concerning for atelectasis or infiltrate. Probable mild right basilar subsegmental atelectasis. Electronically Signed   By: Marijo Conception, M.D.   On: 01/12/2016 18:46   Dg Chest Port 1 View  Result Date: 01/12/2016 CLINICAL DATA:  Respiratory failure, intubated patient EXAM: PORTABLE CHEST 1 VIEW COMPARISON:  Portable chest x-ray of January 10, 2016 FINDINGS: The lungs are reasonably well inflated. Diffuse interstitial and alveolar opacities persist but are  slightly less conspicuous overall. The cardiac silhouette is top-normal in size. The pulmonary vascularity is less engorged and more distinct. The endotracheal tube tip lies 4.8 cm above the carina. The esophagogastric tube tip in proximal port lie in the gastric cardia. The right internal jugular Cordis sheath tip projects over the proximal SVC. The left subclavian or PICC line catheter tips project over the midportion of the SVC. Stable moderate dextrocurvature with posterior fusion appliances in the lower thoracic spine. IMPRESSION: Slight interval improvement in bilateral interstitial and alveolar opacities. This may reflect decreasing pneumonia or pulmonary edema. The pulmonary vascularity is less engorged and more distinct. The support tubes are in stable position. Advancement of the nasogastric tube by 10 cm would assure that the proximal port remains below the GE junction. Electronically Signed  By: David  Martinique M.D.   On: 01/12/2016 07:01   Dg Abd Portable 1v  Result Date: 01/13/2016 CLINICAL DATA:  NG placement EXAM: PORTABLE ABDOMEN - 1 VIEW COMPARISON:  Of 01/11/2016 FINDINGS: NG tube in the body the stomach. Side hole in the stomach near the GE junction. Dilated colon with a large amount of stool in the colon. Lumbar fusion hardware. IMPRESSION: NG tube advanced into the stomach Colonic dilatation with large amount of stool. Electronically Signed   By: Franchot Gallo M.D.   On: 01/13/2016 11:37     Assessment/Plan: HCAP  =Clinical pictures suggestive of ARDS, possibly multifactorial with infectious cause +/- ILD risk with RA. He had been on broad spectrum abtx for 11 days plus antifungal for 9. Consider stopping at 14days due to being an immunocompromised host. At increased risk of cdifficile.  Respiratory distress, vent dependent = now with trach  Ileus = continue with NG to help decompress.  Leukocytosis = unchanged at 23K. If worsens, would consider further work up for other  infections (including cdiff)  Acute on CKD = continue on CVVH, appears to be having improved urine output  RA  = continue on solumedrol  UGIB due to gastritis = likely multifactorial, critical illness and steroids. Continue to follow cbc. Continue with ppi  Right ear lesion= likely pressure ulcer, viral culture pending           Sincere [facility] Infectious Diseases (pager) [phone] [email or web address] 01/13/2016, 3:03 PM  LOS: 10 days

## 2016-01-13 NOTE — Progress Notes (Signed)
PULMONARY / CRITICAL CARE MEDICINE   Name: Curtis Frazier MRN: [id number] DOB: Aug 29, 1940    ADMISSION DATE:  01/03/2016 CONSULTATION DATE:  01/03/2016  REFERRING MD:  Dr. Dayna Barker EDP  CHIEF COMPLAINT:  SOB/AMS  HISTORY:  75 yo male presented with lethargy, hypoxia, hypotension from ARDS with concern for HCAP versus acute pneumonitis with hx of rheumatoid arthritis..  SUBJECTIVE:   Sedated post trach  VITAL SIGNS: BP (!) 99/53   Pulse 86   Temp 99.3 F (37.4 C) (Core (Comment))   Resp 19   Ht 5\' 8"  (1.727 m)   Wt 154 lb 15.7 oz (70.3 kg)   SpO2 94%   BMI 23.57 kg/m   HEMODYNAMICS:    VENTILATOR SETTINGS: Vent Mode: PRVC FiO2 (%):  [40 %] 40 % Set Rate:  [18 bmp] 18 bmp Vt Set:  [500 mL] 500 mL PEEP:  [5 cmH20] 5 cmH20 Plateau Pressure:  [10 cmH20-14 cmH20] 10 cmH20  INTAKE / OUTPUT: I/O last 3 completed shifts: In: 3411.2 [I.V.:1581.7; Blood:322.5; Other:22; NG/GT:5; IV [id number] Out: [id number] [Urine:211; Other:1081]  PHYSICAL EXAMINATION: General: remains on CRRT Neuro: sedated for trach HEENT: #8 perc trach in place Cardiac: regular, SR Chest: scattered rhonchi, decreased in bases Abd: soft, decreased distention and tympany, OGT out, will place NGT to LWIS x 24, no bs Ext: no edema Skin: ulcer on Rt ear  LABS:  BMET  Recent Labs Lab 01/12/16 0430 01/12/16 1537 01/13/16 0330  NA 136 137 136  K 4.4 4.5 4.7  CL 105 104 105  CO2 26 26 25   BUN 25* 24* 46*  CREATININE 1.50* 1.20 2.35*  GLUCOSE 96 141* 161*    Electrolytes  Recent Labs Lab 01/11/16 0445  01/12/16 0430 01/12/16 1537 01/13/16 0330  CALCIUM 8.4*  < > 8.3* 8.5* 8.8*  MG 2.6*  --  2.5*  --  2.6*  PHOS 3.7  < > 3.8 3.6 4.9*  < > = values in this interval not displayed.  CBC  Recent Labs Lab 01/11/16 0445 01/12/16 0430 01/13/16 0330  WBC 20.7* 23.0* 23.1*  HGB 11.1* 10.5* 10.3*  HCT 36.7* 34.4* 32.6*  PLT 202 165 160    Coag's  Recent Labs Lab 01/11/16 0445  01/12/16 0430 01/13/16 0330  APTT 35 34 35  INR 1.87 1.75 1.80    Sepsis Markers No results for input(s): LATICACIDVEN, PROCALCITON, O2SATVEN in the last 168 hours.  ABG  Recent Labs Lab 01/07/16 1155 01/08/16 0336 01/09/16 0336  PHART 7.292* 7.249* 7.236*  PCO2ART 52.2* 61.0* 62.9*  PO2ART 64.8* 71.7* 73.8*    Liver Enzymes  Recent Labs Lab 01/12/16 0430 01/12/16 1537 01/13/16 0330  ALBUMIN 2.2* 2.4* 2.2*    Cardiac Enzymes  Recent Labs Lab 01/09/16 1210 01/09/16 1800 01/10/16 0022  TROPONINI 0.03* 0.03* 0.04*    Glucose  Recent Labs Lab 01/12/16 0820 01/12/16 1227 01/12/16 1712 01/12/16 2017 01/12/16 2342 01/13/16 0358  GLUCAP 81 104* 147* 164* 193* 156*    Imaging Dg Chest Port 1 View  Result Date: 01/12/2016 CLINICAL DATA:  Central line repositioning. EXAM: PORTABLE CHEST 1 VIEW COMPARISON:  Radiograph of same day. FINDINGS: Stable cardiomediastinal silhouette. Endotracheal and nasogastric tubes are in grossly good position. No pneumothorax or pleural effusion is noted. Mild left lower lobe opacity is noted concerning for atelectasis or infiltrate. Mild right basilar opacity is noted. Right internal jugular catheter is unchanged in position. Left subclavian catheter has been withdrawn significantly, with distal tip now seen in region  of left subclavian vein. Bony thorax is unremarkable. IMPRESSION: Left subclavian catheter has been withdrawn significantly, with distal tip now seen in region of left subclavian vein. Otherwise stable support apparatus. Left basilar opacity is noted concerning for atelectasis or infiltrate. Probable mild right basilar subsegmental atelectasis. Electronically Signed   By: Marijo Conception, M.D.   On: 01/12/2016 18:46     STUDIES:  7/15 CTA Chest >> b/l GGO and interlobular septal thickening, bibasilar consolidation, mediastinal LAN 7/26 Echo >> mild LVH, EF 55 to [id number], grade 1 diastolic CHF, mod AS, mod TR, severe LA  dilation, PAS 40 mmHg   SIGNIFICANT MICROBIOLOGY: Respiratory Viral Panel PCR 7/15:  Negative  Blood Ctx x4 7/24: 1/4 Coag Neg Staph Tracheal Asp Ctx 7/24:  Oral Flora Respiratory Viral Panel PCR 7/24:  Negative  Tracheal Asp Ctx 7/27: Normal respiratory flora  ANTIBIOTICS: Aztreonam 7/24 >7/25 Imipenem 7/24 - 7/25 Vancomycin 7/24 > 7/25 Levaquin 7/25 >> 8/1 Meropenem 7/25 >> Linezolid 7/25 >>  Anidulofungin 7/27 >>   SIGNIFICANT EVENTS: 7/22 - discharge to SNF 7/24 - Admit, intubated for respiratory failure  7/25 - worsening acidosis, off pressors, ARDS protocol, CVVHD initiated 7/26 - Nimbex discontinued  7/30 - hypotension with sedation, vasopressors restarted, ST segment changes  7/31 - vomiting overnight, NGT to suction 8/02 - off pressors 8/3- trached  LINES/TUBES: ETT 8.5 7/24 >>8/3 #8 trach 8/3(DF)>> L Subclavian CVL 7/25 >> 8/3 pulled out by pt. R IJ HD CVL 7/25 >>  DISCUSSION: 75 year old male with baseline RA and COPD and a recent admission for acute respiratory failure with hypoxemia was admitted on 7/24 with ARDS most likely due to HCAP but there has been question of underlying ILD related to his rheumatoid arthritis.  Has multi-organ failure.  Off ARDS protocol, CVVH on going.  Trached 8/3.  ASSESSMENT / PLAN:  PULMONARY A: Acute hypoxic respiratory failure from ARDS with HCAP versus acute pneumonitis in setting of RA. Hx of COPD with emphysema. Hx of OSA. P:   Pressure support wean as tolerated Trached 8/3 Continue solumedrol for pneumonitis Scheduled BDs  CARDIOVASCULAR A:  Abnormal EKG >> ST elevation RCA distribution, resolved spontaneously without hemodynamic change, uncertain etiology. Seen on 7/28 & again 7/30. Suspect demand with hypotension in setting of moderate aortic stenosis. Shock - Likely sedation & CVVHD related.  Hx of CAD, Carotid artery disease, chronic diastolic CHF, HTN, a fib s/p ablation, HLD, mod AS. P:  Monitor  hemodynamics Hold ASA with concern about gastritis  RENAL A:   AKI. Hx of CKD 3. P:   CRRT per nephrology  GASTROINTESTINAL A:   Severe constipation noted 8/01. Gastritis. P:   NPO Place NGT 8/3 to LWIS, attempt tf in 24 hours Pepcid IV q24hr Senna bid VT PRN zofran Dulcolax prn  HEMATOLOGIC  A:   Coagulopathy - Secondary to Coumadin. Anemia of critical illness and chronic disease. P:  F/u CBC, INR  INFECTIOUS A:   HCAP. Staph in blood culture >> likely contaminate. Candidal scrotal rash. Pressure ulcer Rt ear. P:   Abx per ID  ENDOCRINE A:   DM type II. P:   SSI  NEUROLOGIC A:   Acute metabolic encephalopathy. P:   RASS goal 0 to -1 Decrease sedation 8/3 now that he is trached.  Richardson Landry Minor ACNP [facility] PCCM Pager [phone] till 3 pm If no answer page [phone] 01/13/2016, 8:14 AM  He is off CRRT.  Had trach this AM.  Remains on low  dose pressors.    Trach site clean.  B/l rhonchi.  HR regular.  Abd with mild tenderness.  Assessment/plan:  Acute respiratory failure. - full vent support today >> push weaning tomorrow  Gastric residual. Constipation. - place NG tube - bowel regimen  HCAP. - Abx per ID  AKI. - monitor off CRRT  Hx of RA. - continue solumedrol.  CC time by me independent of APP time 31 minutes.  Chesley Mires, MD [facility] Pulmonary/Critical Care 01/13/2016, 10:05 AM Pager:  [phone] After 3pm call: [phone]

## 2016-01-13 NOTE — Progress Notes (Addendum)
NUTRITION NOTE  Pt seen for full follow-up by RD 8/2. Trach placed this AM at ~0800. NGT remains to suction and CCM NP note from this AM indicates possible ability to re-start TF 8/4. Note also indicates pt now off ARDS protocol.  RD will follow-up tomorrow for POC, ability to re-start TF. Estimated nutrition needs will be updated at that time. CRRT is ongoing at this time.   IVF: D5-NS @ 40 mL/hr (163 kcal).    Jarome Matin, MS, RD, LDN Inpatient Clinical Dietitian Pager # [phone] After hours/weekend pager # [phone]

## 2016-01-13 NOTE — Progress Notes (Signed)
Post trach procedure RT placed all emergency bedside equipment in PT room- RN aware.

## 2016-01-13 NOTE — Procedures (Signed)
Bedside Tracheostomy Insertion Procedure Note   Patient Details:   Name: Curtis Frazier DOB: 02/23/1941 MRN: [id number]  Procedure: Tracheostomy  Pre Procedure Assessment: ET Tube Size:8.5 ET Tube secured at lip (cm):23 at lips Bite block in place: Yes Breath Sounds: Diminished  Post Procedure Assessment: BP (!) 117/49   Pulse 86   Temp 99.1 F (37.3 C) (Core (Comment))   Resp 17   Ht 5\' 8"  (1.727 m)   Wt 154 lb 15.7 oz (70.3 kg)   SpO2 95%   BMI 23.57 kg/m  O2 sats: stable throughout Complications: No apparent complications Patient did tolerate procedure well Tracheostomy Brand:Shiley Tracheostomy Style:Cuffed Tracheostomy Size: 8.0 Tracheostomy Secured [id number] and velcro Tracheostomy Placement Confirmation:Trach cuff visualized and in place  And chest x-ray done  Vent rate increased to 25  fio2 changed to 100% during the procedure.  Lisa Craddock,RRT,RCP informed.  Curtis Frazier 01/13/2016, 8:35 AM

## 2016-01-13 NOTE — Progress Notes (Signed)
Date:  January 13, 2016 Discharge Planning:  Spoke with wife concerning next steps, does not want him to go to a snf at this point/asked about LTACH- refuses [facility] will consider [facility]/ Dr Felipa Evener notified of process. Expected discharge date: [id number] Velva Harman, [place], [place], [place]

## 2016-01-13 NOTE — Progress Notes (Signed)
[facility] Progress Note   Subjective: on CRRT holiday- stopped yesterday afternoon- had trach this AM- some low BP's (not sure if periprocedure)  UOP is up some  Vitals:   01/13/16 0600 01/13/16 0728 01/13/16 0900 01/13/16 1000  BP: (!) 99/53 (!) 117/49 (!) 81/37 (!) 115/48  Pulse: 86     Resp: 19 17 (!) 29 (!) 34  Temp: 99.3 F (37.4 C) 99.1 F (37.3 C)    TempSrc: Core (Comment) Core (Comment)    SpO2: 94% 95% (!) 86% 96%  Weight:      Height:        Inpatient medications: . sodium chloride   Intravenous Once  . anidulafungin  100 mg Intravenous Q24H  . antiseptic oral rinse  7 mL Mouth Rinse QID  . chlorhexidine gluconate (SAGE KIT)  15 mL Mouth Rinse BID  . famotidine (PEPCID) IV  20 mg Intravenous Q12H  . heparin subcutaneous  5,000 Units Subcutaneous Q8H  . insulin aspart  0-15 Units Subcutaneous Q4H  . ipratropium-albuterol  3 mL Nebulization Q6H  . linezolid (ZYVOX) IV  600 mg Intravenous Q12H  . meropenem (MERREM) IV  1 g Intravenous Q12H  . methylPREDNISolone (SOLU-MEDROL) injection  40 mg Intravenous Daily  . nystatin  5 mL Oral TID  . nystatin   Topical TID  . propofol  5-80 mcg/kg/min Intravenous Once  . sennosides  5 mL Per Tube Q12H  . sodium chloride  500 mL Intravenous Once  . sodium chloride flush  10-40 mL Intracatheter Q12H   . dextrose 5 % and 0.9% NaCl 40 mL/hr at 01/12/16 1800  . fentaNYL infusion INTRAVENOUS 50 mcg/hr (01/13/16 0757)  . phenylephrine (NEO-SYNEPHRINE) Adult infusion 15 mcg/min (01/13/16 1012)   sodium chloride, albuterol, alteplase, fentaNYL, fentaNYL (SUBLIMAZE) injection, heparin, ipratropium, midazolam, ondansetron (ZOFRAN) IV, sodium chloride flush  Exam: Gen on vent, eyes open, no commands  Sclera anicteric, throat w ETT-  Right IJ vascath placed 7/25 No jvd or bruits Chest some post crackles at bases RRR no MRG Abd soft nondistended no ascites or mass GU normal male w Foley Ext no LE or UE  edema Neuro as above  CT chest > underlying emphysema w bilat patchy infiltrates/ ground glass changes, LL > UL's CXR 7.25 > patchy bilat infiltrates UA - negative CVP 10-12  Assessment: 1  Acute on CRF3 - baseline creatinine in the high 1's- ischemic ATN due to shock most likely, CRRT from 7/25 to 8/2, currently on CRRT holiday.   urine output up some last 24 hours - no indications to resume CRRT at this time- will gove it another day and see how he does 2  Recurrent resp failure - ARDS + prob HCAP- per CCM- making some positive movements but now s/p trach on 8/3 3  Volume - CVP variable, stable wts, no vol excess- was running even-  give CCM license to bolus  as I do not think is wet- another reason to give CRRT holiday  4  COPD underlying 5  Hx RA/ psor arthriitis/ PMR - chron immunosuppressed for yrs (methotrexate, Remicade/ Humira, pred) 6. Acidosis- improved 7. Elytes- stable  8. Anemia- supportive care only right now- hgb over 10     Alexx Mcburney A  01/13/2016, 10:36 AM    Recent Labs Lab 01/12/16 0430 01/12/16 1537 01/13/16 0330  NA 136 137 136  K 4.4 4.5 4.7  CL 105 104 105  CO2 26 26 25   GLUCOSE 96 141* 161*  BUN 25* 24* 46*  CREATININE 1.50* 1.20 2.35*  CALCIUM 8.3* 8.5* 8.8*  PHOS 3.8 3.6 4.9*    Recent Labs Lab 01/12/16 0430 01/12/16 1537 01/13/16 0330  ALBUMIN 2.2* 2.4* 2.2*    Recent Labs Lab 01/11/16 0445 01/12/16 0430 01/13/16 0330  WBC 20.7* 23.0* 23.1*  NEUTROABS 17.8* 19.8* 20.3*  HGB 11.1* 10.5* 10.3*  HCT 36.7* 34.4* 32.6*  MCV 97.6 96.6 94.2  PLT 202 165 160   Iron/TIBC/Ferritin/ %Sat    Component Value Date/Time   IRON 24 (L) 08/13/2015 0430   TIBC 258 08/13/2015 0430   FERRITIN 147 08/13/2015 0430   IRONPCTSAT 9 (L) 08/13/2015 0430

## 2016-01-14 ENCOUNTER — Other Ambulatory Visit: Payer: Self-pay | Admitting: *Deleted

## 2016-01-14 ENCOUNTER — Inpatient Hospital Stay (HOSPITAL_COMMUNITY): Payer: Medicare Other

## 2016-01-14 ENCOUNTER — Encounter: Payer: Self-pay | Admitting: *Deleted

## 2016-01-14 DIAGNOSIS — K2971 Gastritis, unspecified, with bleeding: Secondary | ICD-10-CM

## 2016-01-14 DIAGNOSIS — Z7952 Long term (current) use of systemic steroids: Secondary | ICD-10-CM

## 2016-01-14 DIAGNOSIS — K5900 Constipation, unspecified: Secondary | ICD-10-CM

## 2016-01-14 LAB — PREPARE FRESH FROZEN PLASMA
UNIT DIVISION: 0
Unit division: 0

## 2016-01-14 LAB — RENAL FUNCTION PANEL
ALBUMIN: 2.2 g/dL — AB (ref 3.5–5.0)
Anion gap: 5 (ref 5–15)
BUN: 63 mg/dL — AB (ref 6–20)
CHLORIDE: 108 mmol/L (ref 101–111)
CO2: 26 mmol/L (ref 22–32)
CREATININE: 2.58 mg/dL — AB (ref 0.61–1.24)
Calcium: 8.7 mg/dL — ABNORMAL LOW (ref 8.9–10.3)
GFR calc Af Amer: 26 mL/min — ABNORMAL LOW (ref >60)
GFR, EST NON AFRICAN AMERICAN: 23 mL/min — AB (ref >60)
GLUCOSE: 153 mg/dL — AB (ref 65–99)
PHOSPHORUS: 5.2 mg/dL — AB (ref 2.5–4.6)
POTASSIUM: 4.9 mmol/L (ref 3.5–5.1)
Sodium: 139 mmol/L (ref 135–145)

## 2016-01-14 LAB — GLUCOSE, CAPILLARY
GLUCOSE-CAPILLARY: 177 mg/dL — AB (ref 65–99)
GLUCOSE-CAPILLARY: 180 mg/dL — AB (ref 65–99)
Glucose-Capillary: 141 mg/dL — ABNORMAL HIGH (ref 65–99)
Glucose-Capillary: 128 mg/dL — ABNORMAL HIGH (ref 65–99)
Glucose-Capillary: 172 mg/dL — ABNORMAL HIGH (ref 65–99)

## 2016-01-14 LAB — CBC WITH DIFFERENTIAL/PLATELET
BASOS ABS: 0 10*3/uL (ref 0.0–0.1)
Basophils Relative: 0 %
EOS ABS: 0 10*3/uL (ref 0.0–0.7)
Eosinophils Relative: 0 %
HEMATOCRIT: 28.3 % — AB (ref 39.0–52.0)
HEMOGLOBIN: 9.1 g/dL — AB (ref 13.0–17.0)
LYMPHS PCT: 5 %
Lymphs Abs: 1 10*3/uL (ref 0.7–4.0)
MCH: 30.1 pg (ref 26.0–34.0)
MCHC: 32.2 g/dL (ref 30.0–36.0)
MCV: 93.7 fL (ref 78.0–100.0)
MONOS PCT: 10 %
Monocytes Absolute: 2 10*3/uL — ABNORMAL HIGH (ref 0.1–1.0)
Neutro Abs: 17.4 10*3/uL — ABNORMAL HIGH (ref 1.7–7.7)
Neutrophils Relative %: 85 %
Platelets: 131 10*3/uL — ABNORMAL LOW (ref 150–400)
RBC: 3.02 MIL/uL — AB (ref 4.22–5.81)
RDW: 18.5 % — AB (ref 11.5–15.5)
WBC: 20.4 10*3/uL — AB (ref 4.0–10.5)

## 2016-01-14 LAB — APTT: aPTT: 38 seconds — ABNORMAL HIGH (ref 24–36)

## 2016-01-14 LAB — PROTIME-INR
INR: 1.46
Prothrombin Time: 17.9 seconds — ABNORMAL HIGH (ref 11.4–15.2)

## 2016-01-14 LAB — MAGNESIUM: MAGNESIUM: 2.3 mg/dL (ref 1.7–2.4)

## 2016-01-14 LAB — PHOSPHORUS
PHOSPHORUS: 4 mg/dL (ref 2.5–4.6)
PHOSPHORUS: 4.1 mg/dL (ref 2.5–4.6)

## 2016-01-14 MED ORDER — VITAL HIGH PROTEIN PO LIQD: 1000.0000 mL | ORAL | Status: DC

## 2016-01-14 MED ORDER — MIDAZOLAM HCL 2 MG/2ML IJ SOLN
1.0000 mg | INTRAMUSCULAR | Status: DC | PRN
  Filled 2016-01-14 (×3): qty 2

## 2016-01-14 MED ORDER — VITAL 1.5 CAL PO LIQD
1000.0000 mL | ORAL | Status: DC
  Administered 2016-01-14 – 2016-01-19 (×4): 1000 mL
  Filled 2016-01-14 (×6): qty 1000

## 2016-01-14 MED ORDER — MIDAZOLAM HCL 2 MG/2ML IJ SOLN
1.0000 mg | INTRAMUSCULAR | Status: DC | PRN
  Administered 2016-01-14 – 2016-01-15 (×6): 1 mg via INTRAVENOUS
  Filled 2016-01-14 (×3): qty 2

## 2016-01-14 MED ORDER — PRO-STAT SUGAR FREE PO LIQD
30.0000 mL | Freq: Every day | ORAL | Status: DC
  Administered 2016-01-14 – 2016-01-18 (×4): 30 mL
  Filled 2016-01-14 (×4): qty 30

## 2016-01-14 MED ORDER — SORBITOL 70 % SOLN
960.0000 mL | TOPICAL_OIL | Freq: Once | ORAL | Status: AC
  Administered 2016-01-14: 960 mL via RECTAL
  Filled 2016-01-14: qty 240

## 2016-01-14 MED ORDER — LACTULOSE 10 GM/15ML PO SOLN
20.0000 g | Freq: Once | ORAL | Status: AC
  Administered 2016-01-14: 20 g via ORAL
  Filled 2016-01-14: qty 30

## 2016-01-14 MED ORDER — POLYETHYLENE GLYCOL 3350 17 G PO PACK
17.0000 g | PACK | Freq: Once | ORAL | Status: AC
  Administered 2016-01-14: 17 g via ORAL
  Filled 2016-01-14: qty 1

## 2016-01-14 MED ORDER — PRO-STAT SUGAR FREE PO LIQD: 30.0000 mL | Freq: Two times a day (BID) | ORAL | Status: DC

## 2016-01-14 MED ORDER — FENTANYL CITRATE (PF) 100 MCG/2ML IJ SOLN
50.0000 ug | INTRAMUSCULAR | Status: DC | PRN
  Filled 2016-01-14: qty 2

## 2016-01-14 MED ORDER — SODIUM CHLORIDE 0.9 % IV SOLN
500.0000 mg | Freq: Two times a day (BID) | INTRAVENOUS | Status: AC
  Administered 2016-01-14 – 2016-01-16 (×3): 500 mg via INTRAVENOUS
  Filled 2016-01-14 (×3): qty 0.5

## 2016-01-14 MED ORDER — FENTANYL CITRATE (PF) 100 MCG/2ML IJ SOLN
50.0000 ug | INTRAMUSCULAR | Status: DC | PRN
  Administered 2016-01-14 – 2016-01-15 (×5): 50 ug via INTRAVENOUS
  Filled 2016-01-14 (×4): qty 2

## 2016-01-14 NOTE — Patient Outreach (Signed)
[facility]) Care Management  01/14/2016  Curtis Frazier 16-Dec-1940 [id number]   [name] has been followed by [facility], community care coordinator and has been hospitalized for greater than 10 days, per [facility] care management protocol will close case, and await follow up for community care coordinator needs.   Plan Plan close case per protocol Will send in basket message to [facility], and await further referral for  community care management needs.    [name], RN, [place] Management [phone]- Mobile [phone]- Toll Free Main Office

## 2016-01-14 NOTE — Progress Notes (Addendum)
Pharmacy Antibiotic Note  Curtis Frazier is a 75 y.o. male with PMHx CAD s/p stent, PAF on warfarin s/p recent ablation, CKD-III, DM, diastolic CHF, and RA on chronic prednisone with recent hospitalization for PNA and discharged 7/22,  presents 7/24 from SNF for AMS.  Bronch performed showing diffuse, thick white creamy pus throughout.  CT shows interlobular septal thickening with bilateral ground-glass attenuation with features compatible with pulmonary edema although superimposed infectious / inflammatory etiology not excluded. Pharmacy was intitially consulted for Vanc and Primaxin dosing, but ID consulted on 7/25 and Vanc & Primaxin have been D/C'ed.  Zyvox and Levofloxacin to begin and Pharmacy consulted to dose Meropenem.    8/4: CRRT from 7/25 to 8/2. Stopped for a "holiday". UOP increased up to 1.1 ml/kg/hr.    D10 Abx, D8 Eraxis. ID planning 14 days of antibiotics. WBC remains elevated but slightly better.   Plan:  Reduce Meropenem from 1g to 500mg  IV q12h since off CRRT. CrCl ~66ml/min.  Zyvox 600mg  IV q12h per MD  Eraxis 100mg  IV q24h per MD  F/u renal function, cultures, clinical course  Height: 5\' 8"  (172.7 cm) Weight: 151 lb 14.4 oz (68.9 kg) IBW/kg (Calculated) : 68.4  Temp (24hrs), Avg:99.7 F (37.6 C), Min:99.1 F (37.3 C), Max:100.8 F (38.2 C)   Recent Labs Lab 01/10/16 0444  01/11/16 0445 01/11/16 1620 01/12/16 0430 01/12/16 1537 01/13/16 0330 01/14/16 0428  WBC 19.4*  --  20.7*  --  23.0*  --  23.1* 20.4*  CREATININE 1.55*  < > 1.47* 1.45* 1.50* 1.20 2.35* 2.58*  < > = values in this interval not displayed.  Estimated Creatinine Clearance: 23.6 mL/min (by C-G formula based on SCr of 2.58 mg/dL).    Allergies  Allergen Reactions  . Ace Inhibitors Other (See Comments)    Causes high K   . Angiotensin Receptor Blockers Other (See Comments)    Causes high K   . Metoprolol Other (See Comments)    May cause elevated K level   . Penicillins Swelling  and Rash    Has patient had a PCN reaction causing immediate rash, facial/tongue/throat swelling, SOB or lightheadedness with hypotension: YES Has patient had a PCN reaction causing severe rash involving mucus membranes or skin necrosis: NO Has patient had a PCN reaction that required hospitalization NO Has patient had a PCN reaction occurring within the last 10 years: NO If all of the above answers are "NO", then may proceed with Cephalosporin use. Pt has tolerated cephalosporins in the past   . Amiodarone Other (See Comments)    12/27/15: Cardiologist discontinued the amiodarone (on PTA) due to  concern for toxicity  (in hospitial with acute hypoxemic respiratory failure)   . Tetracycline Swelling and Rash    Antimicrobials this admission: 7/24 Vancomycin >> 7/25 7/24 Primaxin >> 7/25 7/25 Levofloxacin >> 8/1 7/25 Meropenem>> 7/25 Zyvox >> 7/27 Eraxis >>  Dose adjustments this admission: 7/26 Meropenem and Levaquin dose adjusted while on CRRT  Microbiology results: 7/24 BCx: 1/2 Staph species, 1/2 NG 7/24 BCID: Staphylococcus species DETECTED  7/24 BAL: rare GPC pairs, rare gram variable, rare yeast - consistent with normal flora 7/24 UCx: NGF 7/24 BCx: NGF 7/24 MRSA PCR: negative 7/24 Resp panel: none detected 7/27 Pneumocystis smear (BAL): negative 7/27 Resp cx: normal flora (gram stain: Rare yeast, rare GPC in pairs, chains) 7/27 BCx: NGF 8/1 R ear viral cx: sent  Thank you for allowing pharmacy to be a part of this patient's care.  Romeo Rabon, PharmD, pager [phone]. 01/14/2016,7:03 AM.

## 2016-01-14 NOTE — Progress Notes (Signed)
CSW following to assist with d/c planning. Pt now has trach. RNCM will assist with possible LTAC placement when appropriate. CSW will remain available to assist with SNF placement if LTAC is unable to assist.  Werner Lean LCSW [phone]

## 2016-01-14 NOTE — Progress Notes (Signed)
Family (daughters Anneita & [name]) updated extensively at bedside on plan of care & patients current clinical status.  Questions answered.  Reviewed possible discharge planning needs.     [name], NP-C [facility] Pgr: [phone] or if no answer [phone] 01/14/2016, 10:03 AM

## 2016-01-14 NOTE — Progress Notes (Signed)
INFECTIOUS DISEASE PROGRESS NOTE  ID: Curtis Frazier is a 75 y.o. male with  Active Problems:   Acute respiratory failure with hypoxemia ([facility])   Visit for intubation   ARDS (adult respiratory distress syndrome) (HCC)   Malnutrition of moderate degree   abtx say #11 : linezolid #12/meropenem #12/anidula #10   Subjective: Afebrile today. Had temp of 100.8 isolated last night. Tolerated trach and has been off of vent x 4hr today.Marland Kitchen He maintains to have good urine output since stopping CRRT. Received enema and has had 2 BM thus far. +evidence of gastritis per ng  Abtx:  Anti-infectives    Start     Dose/Rate Route Frequency Ordered Stop   01/14/16 2200  meropenem (MERREM) 500 mg in sodium chloride 0.9 % 50 mL IVPB     500 mg 100 mL/hr over 30 Minutes Intravenous Every 12 hours 01/14/16 1012     01/07/16 1700  anidulafungin (ERAXIS) 100 mg in sodium chloride 0.9 % 100 mL IVPB     100 mg over 90 Minutes Intravenous Every 24 hours 01/06/16 1537     01/06/16 1630  anidulafungin (ERAXIS) 200 mg in sodium chloride 0.9 % 200 mL IVPB     200 mg over 180 Minutes Intravenous  Once 01/06/16 1535 01/06/16 1954   01/05/16 2200  meropenem (MERREM) 1 g in sodium chloride 0.9 % 100 mL IVPB  Status:  Discontinued     1 g 200 mL/hr over 30 Minutes Intravenous Every 12 hours 01/05/16 1023 01/14/16 1012   01/05/16 1030  meropenem (MERREM) 500 mg in sodium chloride 0.9 % 50 mL IVPB     500 mg 100 mL/hr over 30 Minutes Intravenous STAT 01/05/16 1023 01/05/16 1125   01/05/16 0100  meropenem (MERREM) 500 mg in sodium chloride 0.9 % 50 mL IVPB  Status:  Discontinued     500 mg 100 mL/hr over 30 Minutes Intravenous Every 8 hours 01/04/16 1620 01/05/16 1023   01/04/16 2200  linezolid (ZYVOX) IVPB 600 mg  Status:  Discontinued     600 mg 300 mL/hr over 60 Minutes Intravenous Every 12 hours 01/04/16 1555 01/14/16 1422   01/04/16 2000  vancomycin (VANCOCIN) IVPB 1000 mg/200 mL premix  Status:  Discontinued     1,000 mg 200 mL/hr over 60 Minutes Intravenous Every 24 hours 01/03/16 2016 01/04/16 0730   01/04/16 2000  vancomycin (VANCOCIN) IVPB 750 mg/150 ml premix  Status:  Discontinued     750 mg 150 mL/hr over 60 Minutes Intravenous Every 24 hours 01/04/16 0730 01/04/16 1555   01/04/16 1800  imipenem-cilastatin (PRIMAXIN) 500 mg in sodium chloride 0.9 % 100 mL IVPB  Status:  Discontinued     500 mg 200 mL/hr over 30 Minutes Intravenous Every 12 hours 01/04/16 0730 01/04/16 1555   01/04/16 1700  levofloxacin (LEVAQUIN) IVPB 500 mg  Status:  Discontinued     500 mg 100 mL/hr over 60 Minutes Intravenous Every 24 hours 01/04/16 1555 01/11/16 0843   01/04/16 1700  meropenem (MERREM) 1 g in sodium chloride 0.9 % 100 mL IVPB     1 g 200 mL/hr over 30 Minutes Intravenous  Once 01/04/16 1619 01/04/16 1712   01/04/16 0400  imipenem-cilastatin (PRIMAXIN) 500 mg in sodium chloride 0.9 % 100 mL IVPB  Status:  Discontinued     500 mg 200 mL/hr over 30 Minutes Intravenous Every 8 hours 01/03/16 2016 01/04/16 0730   01/03/16 2000  imipenem-cilastatin (PRIMAXIN) 500 mg in sodium chloride  0.9 % 100 mL IVPB     500 mg 200 mL/hr over 30 Minutes Intravenous STAT 01/03/16 1953 01/03/16 2114   01/03/16 1830  vancomycin (VANCOCIN) IVPB 1000 mg/200 mL premix     1,000 mg 200 mL/hr over 60 Minutes Intravenous  Once 01/03/16 1819 01/03/16 2043      Medications:  Scheduled: . anidulafungin  100 mg Intravenous Q24H  . antiseptic oral rinse  7 mL Mouth Rinse QID  . chlorhexidine gluconate (SAGE KIT)  15 mL Mouth Rinse BID  . famotidine (PEPCID) IV  20 mg Intravenous Q12H  . feeding supplement (PRO-STAT SUGAR FREE 64)  30 mL Per Tube Daily  . feeding supplement (VITAL 1.5 CAL)  1,000 mL Per Tube Q24H  . heparin subcutaneous  5,000 Units Subcutaneous Q8H  . insulin aspart  0-15 Units Subcutaneous Q4H  . ipratropium-albuterol  3 mL Nebulization Q6H  . meropenem (MERREM) IV  500 mg Intravenous Q12H  .  methylPREDNISolone (SOLU-MEDROL) injection  40 mg Intravenous Daily  . nystatin  5 mL Oral TID  . nystatin   Topical TID  . sennosides  5 mL Per Tube Q12H  . sodium chloride  500 mL Intravenous Once  . sodium chloride flush  10-40 mL Intracatheter Q12H    Objective: Vital signs in last 24 hours: Temp:  [99.1 F (37.3 C)-100.8 F (38.2 C)] 99.3 F (37.4 C) (08/04 0500) Pulse Rate:  [90-111] 90 (08/04 0334) Resp:  [13-51] 16 (08/04 1200) BP: (94-153)/(43-87) 125/62 (08/04 1200) SpO2:  [91 %-99 %] 91 % (08/04 1200) FiO2 (%):  [30 %-40 %] 40 % (08/04 1200) Weight:  [151 lb 14.4 oz (68.9 kg)] 151 lb 14.4 oz (68.9 kg) (08/04 0153) Physical Exam  Constitutional: He is sedated. He appears chronically ill and well-nourished. No distress.  HENT: trach in place. Cardiovascular: Normal rate, regular rhythm and normal heart sounds. Exam reveals no gallop and no friction rub.  No murmur heard.  Pulmonary/Chest: Effort normal and breath sounds normal. No respiratory distress. He has no wheezes.  Abdominal: soft,only mildly distended but still has decreased BS Skin: Skin is warm and dry. No rash noted. No erythema. Right ear pinna abrasion c/w pressure ulcer, no purulent drainage  Lab Results  Recent Labs  01/13/16 0330 01/14/16 0428  WBC 23.1* 20.4*  HGB 10.3* 9.1*  HCT 32.6* 28.3*  NA 136 139  K 4.7 4.9  CL 105 108  CO2 25 26  BUN 46* 63*  CREATININE 2.35* 2.58*   Liver Panel  Recent Labs  01/13/16 0330 01/14/16 0428  ALBUMIN 2.2* 2.2*   Microbiology: Lurline Idol Cx "resp flora" (unfortunately his BAL did not make it to lab) BCx 1/2 CNS (contaminant), UCx negative. Resp virus panel (-) ur leg (-) CXR improved from 1 week ago, though still has right sided infiltrates.  Recent Results (from the past 240 hour(s))  Culture, respiratory (NON-Expectorated)     Status: None   Collection Time: 01/06/16  9:45 AM  Result Value Ref Range Status   Specimen Description TRACHEAL ASPIRATE   Final   Special Requests Normal  Final   Gram Stain   Final    NO WBC SEEN RARE GRAM POSITIVE COCCI IN PAIRS AND CHAINS RARE YEAST RARE SQUAMOUS EPITHELIAL CELLS PRESENT    Culture   Final    Consistent with normal respiratory flora. Performed at [facility]    Report Status 01/08/2016 FINAL  Final  Pneumocystis smear by DFA  Status: None   Collection Time: 01/06/16 10:04 AM  Result Value Ref Range Status   Specimen Source-PJSRC TRACHEAL ASPIRATE  Final   Pneumocystis jiroveci Ag NEGATIVE  Final    Comment: Performed at [facility]  Culture, blood (Routine X 2) w Reflex to ID Panel     Status: None   Collection Time: 01/06/16 11:37 AM  Result Value Ref Range Status   Specimen Description BLOOD RIGHT HAND  Final   Special Requests BOTTLES DRAWN AEROBIC AND ANAEROBIC  5CC  Final   Culture   Final    NO GROWTH 5 DAYS Performed at [facility]    Report Status 01/11/2016 FINAL  Final  Culture, blood (Routine X 2) w Reflex to ID Panel     Status: None   Collection Time: 01/06/16 11:44 AM  Result Value Ref Range Status   Specimen Description BLOOD LEFT HAND  Final   Special Requests BOTTLES DRAWN AEROBIC ONLY [place]  Final   Culture   Final    NO GROWTH 5 DAYS Performed at [facility]    Report Status 01/11/2016 FINAL  Final  Virus culture     Status: None   Collection Time: 01/11/16  5:48 PM  Result Value Ref Range Status   Viral Culture Comment  Final    Comment: (NOTE) Preliminary Report: No virus isolated at 24 hours. Next report to follow after 4 days. Performed At: [facility] [place], [place] [id number] Lindon Romp MD DG:[id number]    Source of Sample EAR  Final    Comment: RIGHT    Studies/Results: Dg Chest Port 1 View  Result Date: 01/14/2016 CLINICAL DATA:  Respiratory failure EXAM: PORTABLE CHEST 1 VIEW COMPARISON:  Yesterday FINDINGS: New orogastric tube with tip over the proximal  stomach. Dialysis catheter on the right with tip at the SVC level. Tracheostomy tube remains seated. Stable patchy bilateral lung opacity when accounting for low volumes. No effusion or pneumothorax. Normal heart size. IMPRESSION: New orogastric tube in good position. Stable positioning of central line and tracheostomy. Worsening aeration at the left base. Bilateral pneumonia and atelectasis superimposed on emphysema. Electronically Signed   By: Monte Fantasia M.D.   On: 01/14/2016 07:04   Dg Chest Port 1 View  Result Date: 01/13/2016 CLINICAL DATA:  Tracheostomy placement EXAM: PORTABLE CHEST 1 VIEW COMPARISON:  January 12, 2016 FINDINGS: Tracheostomy present with tip 5.4 cm above the carina. Central catheter tip is in the superior vena cava. Nasogastric tube no longer present. No pneumothorax. There is patchy atelectatic change in the left base. There is underlying diffuse interstitial prominence, likely due to edema with questionable superimposed pneumonia, stable. Heart is mildly enlarged with mild pulmonary venous hypertension. There is atherosclerotic calcification in the aorta. There is stable thoracic scoliosis. There is postoperative change in lower thoracic and visualized upper lumbar spine regions. There is right carotid artery calcification. IMPRESSION: Tube and catheter positions as described without pneumothorax. New left base atelectasis. Patchy opacity elsewhere, likely due primarily to pulmonary edema, although there may be superimposed pneumonia, particularly in the right mid lung. Stable cardiac silhouette. Aortic atherosclerosis present. There is calcification in the right carotid artery. Electronically Signed   By: Lowella Grip III M.D.   On: 01/13/2016 08:30   Dg Chest Port 1 View  Result Date: 01/12/2016 CLINICAL DATA:  Central line repositioning. EXAM: PORTABLE CHEST 1 VIEW COMPARISON:  Radiograph of same day. FINDINGS: Stable cardiomediastinal silhouette.  Endotracheal and  nasogastric tubes are in grossly good position. No pneumothorax or pleural effusion is noted. Mild left lower lobe opacity is noted concerning for atelectasis or infiltrate. Mild right basilar opacity is noted. Right internal jugular catheter is unchanged in position. Left subclavian catheter has been withdrawn significantly, with distal tip now seen in region of left subclavian vein. Bony thorax is unremarkable. IMPRESSION: Left subclavian catheter has been withdrawn significantly, with distal tip now seen in region of left subclavian vein. Otherwise stable support apparatus. Left basilar opacity is noted concerning for atelectasis or infiltrate. Probable mild right basilar subsegmental atelectasis. Electronically Signed   By: Marijo Conception, M.D.   On: 01/12/2016 18:46   Dg Abd Portable 1v  Result Date: 01/13/2016 CLINICAL DATA:  NG placement EXAM: PORTABLE ABDOMEN - 1 VIEW COMPARISON:  Of 01/11/2016 FINDINGS: NG tube in the body the stomach. Side hole in the stomach near the GE junction. Dilated colon with a large amount of stool in the colon. Lumbar fusion hardware. IMPRESSION: NG tube advanced into the stomach Colonic dilatation with large amount of stool. Electronically Signed   By: Franchot Gallo M.D.   On: 01/13/2016 11:37     Assessment/Plan: HCAP  = overall improving has been on  broad spectrum abtx for 12 days plus antifungal for 10. Will stop linezolid today. Finish mero tomorrow and anidulafungin on Sunday to complete course.   Respiratory distress, vent dependent = now with trach, getting weaned per PCCM  Ileus = continue with NG to help decompress. Still suffers constipation. Xray suggests large stool burden. Getting SMOG enema which appears to be improving.  Leukocytosis = improving to 20k.  Acute on CKD = now off of CRRT. Appears to be improving post ATN.  RA  = continue on solumedrol  UGIB due to gastritis = likely multifactorial, critical illness and steroids. Continue to  follow cbc. Continue with ppi. Having some decrease in hgb down to 9 from 10.  Right ear lesion= likely pressure ulcer, viral culture negative preliminarily   Dr Megan Salon available for questions. Will see back on monday          [place] Tiedt [facility] (pager) [phone] [email or web address] 01/14/2016, 2:23 PM  LOS: 11 days

## 2016-01-14 NOTE — Progress Notes (Signed)
160cc of 36mcg/ml of fentanyl wasted in sink. Witnessed by Loel Lofty, RN

## 2016-01-14 NOTE — Progress Notes (Signed)
PULMONARY / CRITICAL CARE MEDICINE   Name: Curtis Frazier MRN: [id number] DOB: [birth year]    ADMISSION DATE:  01/03/2016 CONSULTATION DATE:  01/03/2016  REFERRING MD:  Dr. Dayna Barker EDP  CHIEF COMPLAINT:  SOB/AMS  HISTORY:  75 y/o male presented with lethargy, hypoxia, hypotension from ARDS with concern for HCAP versus acute pneumonitis with hx of rheumatoid arthritis..  SUBJECTIVE:   Ongoing concern for constipation - no BM documented.  Tmax 99.3.  UOP 2L in last 24 hours.  Fentanyl at 50 mcg/hr.   VITAL SIGNS: BP (!) 153/87   Pulse 90   Temp 99.3 F (37.4 C) (Core (Comment))   Resp 14   Ht 5\' 8"  (1.727 m)   Wt 68.9 kg (151 lb 14.4 oz)   SpO2 97%   BMI 23.10 kg/m   HEMODYNAMICS:    VENTILATOR SETTINGS: Vent Mode: PRVC FiO2 (%):  [30 %-50 %] 40 % Set Rate:  [18 bmp] 18 bmp Vt Set:  [500 mL] 500 mL PEEP:  [5 cmH20] 5 cmH20 Plateau Pressure:  [8 cmH20-16 cmH20] 14 cmH20  INTAKE / OUTPUT: I/O last 3 completed shifts: In: 4250.6 [I.V.:1631.4; Blood:579.2; Other:610; IV Piggyback:1430] Out: 2175 [Urine:1900; Emesis/NG output:275]  PHYSICAL EXAMINATION: General: chronically ill appearing male in NAD on vent Neuro: mild agitation with stimulation, moves spontaneously HEENT: #8 perc trach in place, mm pink/moist Cardiac: regular, SR, 3/6 SEM Chest: scattered rhonchi, decreased in bases Abd: soft, decreased distention and tympany Ext: no edema Skin: ulcer on Rt ear  LABS:  BMET  Recent Labs Lab 01/12/16 1537 01/13/16 0330 01/14/16 0428  NA 137 136 139  K 4.5 4.7 4.9  CL 104 105 108  CO2 26 25 26   BUN 24* 46* 63*  CREATININE 1.20 2.35* 2.58*  GLUCOSE 141* 161* 153*    Electrolytes  Recent Labs Lab 01/12/16 0430 01/12/16 1537 01/13/16 0330 01/14/16 0428  CALCIUM 8.3* 8.5* 8.8* 8.7*  MG 2.5*  --  2.6* 2.3  PHOS 3.8 3.6 4.9* 5.2*    CBC  Recent Labs Lab 01/12/16 0430 01/13/16 0330 01/14/16 0428  WBC 23.0* 23.1* 20.4*  HGB 10.5* 10.3* 9.1*  HCT  34.4* 32.6* 28.3*  PLT 165 160 131*    Coag's  Recent Labs Lab 01/12/16 0430 01/13/16 0330 01/14/16 0428  APTT 34 35 38*  INR 1.75 1.80 1.46    Sepsis Markers No results for input(s): LATICACIDVEN, PROCALCITON, O2SATVEN in the last 168 hours.  ABG  Recent Labs Lab 01/07/16 1155 01/08/16 0336 01/09/16 0336  PHART 7.292* 7.249* 7.236*  PCO2ART 52.2* 61.0* 62.9*  PO2ART 64.8* 71.7* 73.8*    Liver Enzymes  Recent Labs Lab 01/12/16 1537 01/13/16 0330 01/14/16 0428  ALBUMIN 2.4* 2.2* 2.2*    Cardiac Enzymes  Recent Labs Lab 01/09/16 1210 01/09/16 1800 01/10/16 0022  TROPONINI 0.03* 0.03* 0.04*    Glucose  Recent Labs Lab 01/12/16 2342 01/13/16 0358 01/13/16 0917 01/13/16 1210 01/13/16 1704 01/13/16 2008  GLUCAP 193* 156* 128* 121* 225* 159*    Imaging Dg Chest Port 1 View  Result Date: 01/14/2016 CLINICAL DATA:  Respiratory failure EXAM: PORTABLE CHEST 1 VIEW COMPARISON:  Yesterday FINDINGS: New orogastric tube with tip over the proximal stomach. Dialysis catheter on the right with tip at the SVC level. Tracheostomy tube remains seated. Stable patchy bilateral lung opacity when accounting for low volumes. No effusion or pneumothorax. Normal heart size. IMPRESSION: New orogastric tube in good position. Stable positioning of central line and tracheostomy. Worsening aeration at  the left base. Bilateral pneumonia and atelectasis superimposed on emphysema. Electronically Signed   By: Monte Fantasia M.D.   On: 01/14/2016 07:04   Dg Chest Port 1 View  Result Date: 01/13/2016 CLINICAL DATA:  Tracheostomy placement EXAM: PORTABLE CHEST 1 VIEW COMPARISON:  January 12, 2016 FINDINGS: Tracheostomy present with tip 5.4 cm above the carina. Central catheter tip is in the superior vena cava. Nasogastric tube no longer present. No pneumothorax. There is patchy atelectatic change in the left base. There is underlying diffuse interstitial prominence, likely due to edema  with questionable superimposed pneumonia, stable. Heart is mildly enlarged with mild pulmonary venous hypertension. There is atherosclerotic calcification in the aorta. There is stable thoracic scoliosis. There is postoperative change in lower thoracic and visualized upper lumbar spine regions. There is right carotid artery calcification. IMPRESSION: Tube and catheter positions as described without pneumothorax. New left base atelectasis. Patchy opacity elsewhere, likely due primarily to pulmonary edema, although there may be superimposed pneumonia, particularly in the right mid lung. Stable cardiac silhouette. Aortic atherosclerosis present. There is calcification in the right carotid artery. Electronically Signed   By: Lowella Grip III M.D.   On: 01/13/2016 08:30   Dg Abd Portable 1v  Result Date: 01/13/2016 CLINICAL DATA:  NG placement EXAM: PORTABLE ABDOMEN - 1 VIEW COMPARISON:  Of 01/11/2016 FINDINGS: NG tube in the body the stomach. Side hole in the stomach near the GE junction. Dilated colon with a large amount of stool in the colon. Lumbar fusion hardware. IMPRESSION: NG tube advanced into the stomach Colonic dilatation with large amount of stool. Electronically Signed   By: Franchot Gallo M.D.   On: 01/13/2016 11:37     STUDIES:  7/15 CTA Chest >> b/l GGO and interlobular septal thickening, bibasilar consolidation, mediastinal LAN 7/26 Echo >> mild LVH, EF 55 to [id number], grade 1 diastolic CHF, mod AS, mod TR, severe LA dilation, PAS 40 mmHg   SIGNIFICANT MICROBIOLOGY: Respiratory Viral Panel PCR 7/15:  Negative  Blood Ctx x4 7/24: 1/4 Coag Neg Staph Tracheal Asp Ctx 7/24:  Oral [place] Respiratory Viral Panel PCR 7/24:  Negative  Tracheal Asp Ctx 7/27: Normal respiratory flora  ANTIBIOTICS: Aztreonam 7/24 >7/25 Imipenem 7/24 - 7/25 Vancomycin 7/24 > 7/25 Levaquin 7/25 >> 8/1 Meropenem 7/25 >> Linezolid 7/25 >>  Anidulofungin 7/27 >>   SIGNIFICANT EVENTS: 7/22 - discharge to  SNF 7/24 - Admit, intubated for respiratory failure  7/25 - worsening acidosis, off pressors, ARDS protocol, CVVHD initiated 7/26 - Nimbex discontinued  7/30 - hypotension with sedation, vasopressors restarted, ST segment changes  7/31 - vomiting overnight, NGT to suction 8/02 - off pressors 8/03 - trach  LINES/TUBES: ETT 8.5 7/24 >>8/3 #8 trach 8/3(DF)>> L Subclavian CVL 7/25 >> 8/3 pulled out by pt. R IJ HD CVL 7/25 >>  DISCUSSION: 75 year old male with baseline RA and COPD and a recent admission for acute respiratory failure with hypoxemia was admitted on 7/24 with ARDS most likely due to HCAP but there has been question of underlying ILD related to his rheumatoid arthritis.  Has multi-organ failure.  Off ARDS protocol, CVVH stopped.  Trached 8/3.  ASSESSMENT / PLAN:  PULMONARY A: Acute hypoxic respiratory failure from ARDS with HCAP versus acute pneumonitis in setting of RA. 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 Hx of OSA. P:   Pressure support wean as tolerated, goal ATC Continue solumedrol for pneumonitis Scheduled BDs Intermittent CXR  CARDIOVASCULAR A:  Abnormal EKG >> ST elevation RCA distribution, resolved spontaneously without hemodynamic change, uncertain etiology. Seen on 7/28 & again 7/30. Suspect demand with hypotension in setting of moderate aortic stenosis. Shock - Likely sedation & CVVHD related.  Hx of CAD, Carotid artery disease, chronic diastolic CHF, HTN, a fib s/p ablation, HLD, mod AS. P:  Monitor hemodynamics Hold ASA with concern about gastritis  RENAL A:   AKI. Hx of CKD 3 - off CVVHD 8/3. P:   Nephrology following, appreciate assistance  Monitor BMP / UOP   GASTROINTESTINAL A:   Severe constipation - noted 8/01. Concern for Ileus  Gastritis. P:   NPO Place NGT 8/3 to LWIS Consider restart TF  SMOG enema  Miralax QD Pepcid IV q24hr Senna bid VT PRN zofran Dulcolax prn Consider CT imaging of  abdomen  HEMATOLOGIC A:   Coagulopathy - Secondary to Coumadin.  Thrombocytopenia Anemia of critical illness and chronic disease. P:  F/u CBC, INR Heparin [place] for DVT prophylaxis  Heparin gtt held as patient in SR  INFECTIOUS A:   HCAP. Staph in blood culture >> likely contaminate. Candidal scrotal rash. Pressure ulcer Rt ear. P:   Abx per ID Monitor cultures as above  ENDOCRINE A:   DM type II. P:   SSI  NEUROLOGIC A:   Acute metabolic encephalopathy. P:   RASS goal 0 to -1 Decrease sedation to PRN fentanyl / versed    [name], NP-C [facility] Pgr: [phone] or if no answer [phone] 01/14/2016, 8:26 AM  ATTENDING NOTE / ATTESTATION NOTE :   I have discussed the case with the resident/APP  [name].   I agree with the resident/APP's  history, physical examination, assessment, and plans.    I have edited the above note and modified it according to our agreed history, physical examination, assessment and plan.   Briefly, pt with acute hypoxemic resp failure for HCAP who had tracheostomy on 8/3. Pt was also on CVVH which has been discontinued x 48 hrs now.  Pt noted to have abd distention and plain KUB on 8/3 showed  constipation.  Pt seen, comfortable, not in distress, VSS.  Trache site looks clean. Bibasilar crackles.  Dec BS, soft, sl distended, (-) tenderness, Gr 2 edema. Plan to do trach collar trials as tolerated, vent prn. Try to cut down fentanyl as well (this will help with constipation).  Cont abx. Observe creatinine. Pt making urine.   Needs good bowel regimen for constipation, may need enema. No family at bedside.    I have spent 30 minutes of critical care time with this patient today.  Family : No family at bedside.    Monica Becton, MD 01/14/2016, 1:56 PM [facility] Pager [phone] After 3 pm or if no answer, call [phone]

## 2016-01-14 NOTE — Progress Notes (Signed)
[facility] Progress Note   Subjective: on CRRT holiday- stopped 8/2- making tons of urine- 1700 last 24 hours  Vitals:   01/14/16 0804 01/14/16 0821 01/14/16 0824 01/14/16 0900  BP:    (!) 122/48  Pulse:      Resp:    15  Temp:      TempSrc:      SpO2: 96% 97% 97% 97%  Weight:      Height:        Inpatient medications: . anidulafungin  100 mg Intravenous Q24H  . antiseptic oral rinse  7 mL Mouth Rinse QID  . chlorhexidine gluconate (SAGE KIT)  15 mL Mouth Rinse BID  . famotidine (PEPCID) IV  20 mg Intravenous Q12H  . feeding supplement (PRO-STAT SUGAR FREE 64)  30 mL Per Tube Daily  . feeding supplement (VITAL 1.5 CAL)  1,000 mL Per Tube Q24H  . heparin subcutaneous  5,000 Units Subcutaneous Q8H  . insulin aspart  0-15 Units Subcutaneous Q4H  . ipratropium-albuterol  3 mL Nebulization Q6H  . linezolid (ZYVOX) IV  600 mg Intravenous Q12H  . meropenem (MERREM) IV  500 mg Intravenous Q12H  . methylPREDNISolone (SOLU-MEDROL) injection  40 mg Intravenous Daily  . nystatin  5 mL Oral TID  . nystatin   Topical TID  . sennosides  5 mL Per Tube Q12H  . sodium chloride  500 mL Intravenous Once  . sodium chloride flush  10-40 mL Intracatheter Q12H   . dextrose 5 % and 0.9% NaCl 40 mL/hr at 01/14/16 1033  . phenylephrine (NEO-SYNEPHRINE) Adult infusion Stopped (01/13/16 1503)   sodium chloride, albuterol, alteplase, fentaNYL (SUBLIMAZE) injection, fentaNYL (SUBLIMAZE) injection, ipratropium, midazolam, midazolam, ondansetron (ZOFRAN) IV, sodium chloride flush  Exam: Gen on vent, eyes open, no commands  Sclera anicteric, throat w ETT-  Right IJ vascath placed 7/25 No jvd or bruits Chest some post crackles at bases RRR no MRG Abd soft nondistended no ascites or mass GU normal male w Foley Ext no LE or UE edema Neuro as above  CT chest > underlying emphysema w bilat patchy infiltrates/ ground glass changes, LL > UL's CXR 7.25 > patchy bilat infiltrates UA -  negative CVP 10-12  Assessment: 1  Acute on CRF3 - baseline creatinine in the high 1's- ischemic ATN due to shock most likely, CRRT from 7/25 to 8/2, currently on CRRT holiday.   urine output up a lot last 24 hours - no indications to resume CRRT at this time- will give it more time- BUN and creatinine both up but not remarkably so.  vascath in for 9 days will be mindful 2  Recurrent resp failure - ARDS + prob HCAP- per CCM and ID- making some positive movements but now s/p trach on 8/3 3  Volume - CVP variable, stable wts, no vol excess- was running even-  give CCM license to bolus  as I do not think is wet-  4  COPD underlying 5  Hx RA/ psor arthriitis/ PMR - chron immunosuppressed for yrs (methotrexate, Remicade/ Humira, pred) 6. Acidosis- improved 7. Elytes- stable  8. Anemia- supportive care only right now- hgb was over 10 - drop today maybe due to procedure     Curtis Frazier A  01/14/2016, 11:09 AM    Recent Labs Lab 01/12/16 1537 01/13/16 0330 01/14/16 0428  NA 137 136 139  K 4.5 4.7 4.9  CL 104 105 108  CO2 26 25 26   GLUCOSE 141* 161* 153*  BUN 24*  46* 63*  CREATININE 1.20 2.35* 2.58*  CALCIUM 8.5* 8.8* 8.7*  PHOS 3.6 4.9* 5.2*    Recent Labs Lab 01/12/16 1537 01/13/16 0330 01/14/16 0428  ALBUMIN 2.4* 2.2* 2.2*    Recent Labs Lab 01/12/16 0430 01/13/16 0330 01/14/16 0428  WBC 23.0* 23.1* 20.4*  NEUTROABS 19.8* 20.3* 17.4*  HGB 10.5* 10.3* 9.1*  HCT 34.4* 32.6* 28.3*  MCV 96.6 94.2 93.7  PLT 165 160 131*   Iron/TIBC/Ferritin/ %Sat    Component Value Date/Time   IRON 24 (L) 08/13/2015 0430   TIBC 258 08/13/2015 0430   FERRITIN 147 08/13/2015 0430   IRONPCTSAT 9 (L) 08/13/2015 0430

## 2016-01-14 NOTE — Progress Notes (Addendum)
Nutrition Follow-up  DOCUMENTATION CODES:   Non-severe (moderate) malnutrition in context of acute illness/injury  INTERVENTION:  - Will order Vital 1.5 @ 10 mL/hr and increase by 10 mL every 8 hours to reach goal rate of 50 mL/hr. At goal rate, Vital 1.5 @ 50 mL/hr will provide 1800 kcal, 81 grams of protein, and 917 mL free water.  - Will order 30 mL Prostat to provide additional 100 kcal and 15 grams of protein.  - RD will follow-up 8/6 and place PEPUP protocol if appropriate at that time.  NUTRITION DIAGNOSIS:   Inadequate oral intake related to inability to eat as evidenced by NPO status. -ongoing  GOAL:   Patient will meet greater than or equal to 90% of their needs -unmet  MONITOR:   Vent status, TF tolerance, Weight trends, Labs, Skin, I & O's  ASSESSMENT:   76 year old male with PMH as below which is significant for Diastolic CHF, CKD, COPD, PAF (s/p recent ablation), and rheumatoid arthritis (recently on remicade, now on chronic pred 5mg ). He has several recent hospitalizations. Most recently he was admitted 7/15 for presumed HCAP. He was streated with 9 days of HCAP antibiotics and was transferred to [facility] 7/22. 7/24 he was noted to have AMS to the point where he was essentially comatose. O2 sats noted to be about 60% on room air at that time. He was transported to [facility] ED for further evaluation where he was placed on BiPAP with some improvement. CXR was performed which is consistent with interstitial lung changes noted on very recent CT.  Pt was on CRRT 7/25-8/2 and continues on CRRT break today. NGT was replaced following trach placement 8/3 and is to LIS with ~100cc drainage since yesterday at 1800. Spoke with CCM NP who reports pt continues without BM and that repeat KUB done yesterday continues to show stool in GI tract and plan for enema today. Discussed TF with plan to re-start TF at trickle rate today; TF ordered as outlined above and RD will follow-up 8/6.  Patient is  currently intubated on ventilator support MV: 9.8 L/min Temp (24hrs), Avg:99.8 F (37.7 C), Min:99.1 F (37.3 C), Max:100.8 F (38.2 C) Propofol: none  Weight down 2.2 kg since admission for pt previously on CRRT. At time of visit, BP: 98/48 and MAP: 59. Notes indicate pt with UGIB d/t gastritis.   Medications reviewed; sliding scale Novolog, 40 mg IV Solu-medrol/day.  Labs reviewed; CBGs: 128-180 mg/dL, BUN: 63 mg/dL, creatinine: 2.58 mg/dL, Ca: 8.7 mg/dL, Phos: 5.2 mg/dL, GFR: 23 mg/dL. IVF: D5-NS @ 40 mL/hr (163 kcal).    Diet Order:   NPO  Skin:  Wound (see comment) (L ear bruising)  Last BM:  8/2 per flow sheet, question this based on other documentation and discussions.   Height:   Ht Readings from Last 1 Encounters:  01/05/16 5\' 8"  (1.727 m)    Weight:   Wt Readings from Last 1 Encounters:  01/14/16 151 lb 14.4 oz (68.9 kg)    Ideal Body Weight:  70 kg (kg)  BMI:  Body mass index is 23.1 kg/m.  Estimated Nutritional Needs:   Kcal:  [id number]  Protein:  83-103 grams  Fluid:  1.8 L/day  EDUCATION NEEDS:   No education needs identified at this time    Curtis Matin, MS, RD, LDN Inpatient Clinical Dietitian Pager # [phone] After hours/weekend pager # [phone]

## 2016-01-15 ENCOUNTER — Inpatient Hospital Stay (HOSPITAL_COMMUNITY): Payer: Medicare Other

## 2016-01-15 DIAGNOSIS — G9340 Encephalopathy, unspecified: Secondary | ICD-10-CM

## 2016-01-15 LAB — BASIC METABOLIC PANEL
Anion gap: 4 — ABNORMAL LOW (ref 5–15)
BUN: 64 mg/dL — AB (ref 6–20)
CO2: 29 mmol/L (ref 22–32)
Calcium: 8.9 mg/dL (ref 8.9–10.3)
Chloride: 115 mmol/L — ABNORMAL HIGH (ref 101–111)
Creatinine, Ser: 1.97 mg/dL — ABNORMAL HIGH (ref 0.61–1.24)
GFR calc Af Amer: 36 mL/min — ABNORMAL LOW (ref >60)
GFR, EST NON AFRICAN AMERICAN: 31 mL/min — AB (ref >60)
GLUCOSE: 300 mg/dL — AB (ref 65–99)
POTASSIUM: 4.2 mmol/L (ref 3.5–5.1)
Sodium: 148 mmol/L — ABNORMAL HIGH (ref 135–145)

## 2016-01-15 LAB — URINALYSIS, ROUTINE W REFLEX MICROSCOPIC
Bilirubin Urine: NEGATIVE
Glucose, UA: 500 mg/dL — AB
KETONES UR: NEGATIVE mg/dL
LEUKOCYTES UA: NEGATIVE
NITRITE: NEGATIVE
PH: 5.5 (ref 5.0–8.0)
Protein, ur: 30 mg/dL — AB
SPECIFIC GRAVITY, URINE: 1.016 (ref 1.005–1.030)

## 2016-01-15 LAB — GLUCOSE, CAPILLARY
GLUCOSE-CAPILLARY: 183 mg/dL — AB (ref 65–99)
GLUCOSE-CAPILLARY: 164 mg/dL — AB (ref 65–99)
GLUCOSE-CAPILLARY: 263 mg/dL — AB (ref 65–99)
GLUCOSE-CAPILLARY: 281 mg/dL — AB (ref 65–99)
Glucose-Capillary: 167 mg/dL — ABNORMAL HIGH (ref 65–99)
Glucose-Capillary: 189 mg/dL — ABNORMAL HIGH (ref 65–99)
Glucose-Capillary: 222 mg/dL — ABNORMAL HIGH (ref 65–99)
Glucose-Capillary: 286 mg/dL — ABNORMAL HIGH (ref 65–99)

## 2016-01-15 LAB — CBC WITH DIFFERENTIAL/PLATELET
BASOS ABS: 0 10*3/uL (ref 0.0–0.1)
BASOS PCT: 0 %
EOS ABS: 0 10*3/uL (ref 0.0–0.7)
Eosinophils Relative: 0 %
HEMATOCRIT: 29.6 % — AB (ref 39.0–52.0)
HEMOGLOBIN: 9.5 g/dL — AB (ref 13.0–17.0)
LYMPHS PCT: 8 %
Lymphs Abs: 1.4 10*3/uL (ref 0.7–4.0)
MCH: 30.2 pg (ref 26.0–34.0)
MCHC: 32.1 g/dL (ref 30.0–36.0)
MCV: 94 fL (ref 78.0–100.0)
MONO ABS: 1.2 10*3/uL — AB (ref 0.1–1.0)
Monocytes Relative: 7 %
NEUTROS ABS: 14.8 10*3/uL — AB (ref 1.7–7.7)
NEUTROS PCT: 85 %
Platelets: 140 10*3/uL — ABNORMAL LOW (ref 150–400)
RBC: 3.15 MIL/uL — ABNORMAL LOW (ref 4.22–5.81)
RDW: 18.9 % — ABNORMAL HIGH (ref 11.5–15.5)
WBC: 17.4 10*3/uL — ABNORMAL HIGH (ref 4.0–10.5)

## 2016-01-15 LAB — APTT: APTT: 36 s (ref 24–36)

## 2016-01-15 LAB — URINE MICROSCOPIC-ADD ON: SQUAMOUS EPITHELIAL / LPF: NONE SEEN

## 2016-01-15 LAB — RENAL FUNCTION PANEL
ANION GAP: 5 (ref 5–15)
Albumin: 2.2 g/dL — ABNORMAL LOW (ref 3.5–5.0)
BUN: 60 mg/dL — ABNORMAL HIGH (ref 6–20)
CALCIUM: 8.8 mg/dL — AB (ref 8.9–10.3)
CO2: 29 mmol/L (ref 22–32)
Chloride: 112 mmol/L — ABNORMAL HIGH (ref 101–111)
Creatinine, Ser: 2.12 mg/dL — ABNORMAL HIGH (ref 0.61–1.24)
GFR calc non Af Amer: 29 mL/min — ABNORMAL LOW (ref >60)
GFR, EST AFRICAN AMERICAN: 33 mL/min — AB (ref >60)
GLUCOSE: 190 mg/dL — AB (ref 65–99)
POTASSIUM: 4.5 mmol/L (ref 3.5–5.1)
Phosphorus: 3.8 mg/dL (ref 2.5–4.6)
SODIUM: 146 mmol/L — AB (ref 135–145)

## 2016-01-15 LAB — PROTIME-INR
INR: 1.43
PROTHROMBIN TIME: 17.6 s — AB (ref 11.4–15.2)

## 2016-01-15 LAB — PHOSPHORUS: Phosphorus: 3.8 mg/dL (ref 2.5–4.6)

## 2016-01-15 LAB — MAGNESIUM
Magnesium: 1.8 mg/dL (ref 1.7–2.4)
Magnesium: 1.7 mg/dL (ref 1.7–2.4)

## 2016-01-15 MED ORDER — FREE WATER
200.0000 mL | Status: DC
  Administered 2016-01-15 – 2016-01-16 (×5): 200 mL

## 2016-01-15 MED ORDER — DILTIAZEM HCL-DEXTROSE 100-5 MG/100ML-% IV SOLN (PREMIX)
5.0000 mg/h | INTRAVENOUS | Status: DC
  Administered 2016-01-15: 5 mg/h via INTRAVENOUS
  Filled 2016-01-15 (×2): qty 100

## 2016-01-15 MED ORDER — FENTANYL CITRATE (PF) 100 MCG/2ML IJ SOLN
25.0000 ug | INTRAMUSCULAR | Status: DC | PRN
  Administered 2016-01-15 – 2016-01-16 (×3): 50 ug via INTRAVENOUS
  Administered 2016-01-16 (×2): 25 ug via INTRAVENOUS
  Administered 2016-01-17: 50 ug via INTRAVENOUS
  Administered 2016-01-17: 25 ug via INTRAVENOUS
  Filled 2016-01-15 (×9): qty 2

## 2016-01-15 MED ORDER — FENTANYL CITRATE (PF) 100 MCG/2ML IJ SOLN
100.0000 ug | Freq: Once | INTRAMUSCULAR | Status: AC
  Administered 2016-01-15: 100 ug via INTRAVENOUS
  Filled 2016-01-15: qty 2

## 2016-01-15 MED ORDER — MAGNESIUM SULFATE 2 GM/50ML IV SOLN
2.0000 g | Freq: Once | INTRAVENOUS | Status: AC
  Administered 2016-01-15: 2 g via INTRAVENOUS
  Filled 2016-01-15: qty 50

## 2016-01-15 MED ORDER — MIDAZOLAM HCL 2 MG/2ML IJ SOLN
1.0000 mg | INTRAMUSCULAR | Status: DC | PRN
  Administered 2016-01-15 – 2016-01-16 (×4): 1 mg via INTRAVENOUS
  Filled 2016-01-15 (×4): qty 2

## 2016-01-15 MED ORDER — LACTATED RINGERS IV BOLUS (SEPSIS)
250.0000 mL | Freq: Once | INTRAVENOUS | Status: AC
Start: 2016-01-15 — End: 2016-01-15
  Administered 2016-01-15: 250 mL via INTRAVENOUS

## 2016-01-15 MED ORDER — ACETAMINOPHEN 160 MG/5ML PO SOLN
650.0000 mg | Freq: Four times a day (QID) | ORAL | Status: DC | PRN
  Administered 2016-01-15 – 2016-01-16 (×2): 650 mg
  Filled 2016-01-15 (×2): qty 20.3

## 2016-01-15 NOTE — Progress Notes (Signed)
[facility] Progress Note Patient Name: Curtis Frazier DOB: 1940/11/04 MRN: [id number]   Date of Service  01/15/2016  HPI/Events of Note  On monitor looks like A Fib RVR 140  eICU Interventions  Dc neo order (patiet not on it per RN) Start cardizem gtt Hold heparin infusion due to gastritis per Dr Ashok Cordia note from earlier     Intervention Category Intermediate Interventions: Arrhythmia - evaluation and management  Pawel Soules 01/15/2016, 11:30 PM

## 2016-01-15 NOTE — Progress Notes (Signed)
PULMONARY / CRITICAL CARE MEDICINE   Name: Curtis Frazier MRN: [id number] DOB: 1940/12/22    ADMISSION DATE:  01/03/2016 CONSULTATION DATE:  01/03/2016  REFERRING MD:  Dr. Dayna Barker EDP  CHIEF COMPLAINT:  SOB/AMS  HISTORY:  74 y/o male presented with lethargy, hypoxia, hypotension from ARDS with concern for HCAP versus acute pneumonitis with hx of rheumatoid arthritis..  SUBJECTIVE:   Patient continuing with CVVHD I'll a day. Continued excellent urine output.  REVIEW OF SYSTEMS:  VITAL SIGNS: BP (!) 144/81   Pulse (!) 109   Temp 99.7 F (37.6 C) (Core (Comment)) Comment (Src): foley  Resp (!) 23   Ht 5\' 8"  (1.727 m)   Wt 149 lb 11.1 oz (67.9 kg)   SpO2 95%   BMI 22.76 kg/m   HEMODYNAMICS:    VENTILATOR SETTINGS: Vent Mode: PSV FiO2 (%):  [40 %] 40 % Set Rate:  [18 bmp] 18 bmp Vt Set:  [500 mL] 500 mL PEEP:  [5 cmH20] 5 cmH20 Pressure Support:  [12 cmH20] 12 cmH20 Plateau Pressure:  [14 cmH20] 14 cmH20  INTAKE / OUTPUT: I/O last 3 completed shifts: In: 2539.1 [I.V.:1441.3; Other:170; NG/GT:327.8; IV Piggyback:600] Out: 4750 [Urine:4750]  PHYSICAL EXAMINATION: General: chronically ill appearing male in NAD on vent Neuro: mild agitation with stimulation, moves spontaneously HEENT: #8 perc trach in place, mm pink/moist Cardiac: regular, SR, 3/6 SEM Chest: scattered rhonchi, decreased in bases Abd: soft, decreased distention and tympany Ext: no edema Skin: ulcer on Rt ear  LABS:  BMET  Recent Labs Lab 01/13/16 0330 01/14/16 0428 01/15/16 0500  NA 136 139 146*  K 4.7 4.9 4.5  CL 105 108 112*  CO2 25 26 29   BUN 46* 63* 60*  CREATININE 2.35* 2.58* 2.12*  GLUCOSE 161* 153* 190*    Electrolytes  Recent Labs Lab 01/13/16 0330 01/14/16 0428 01/14/16 1225 01/14/16 1700 01/15/16 0500  CALCIUM 8.8* 8.7*  --   --  8.8*  MG 2.6* 2.3  --   --  1.8  PHOS 4.9* 5.2* 4.0 4.1 3.8  3.8    CBC  Recent Labs Lab 01/13/16 0330 01/14/16 0428 01/15/16 0500   WBC 23.1* 20.4* 17.4*  HGB 10.3* 9.1* 9.5*  HCT 32.6* 28.3* 29.6*  PLT 160 131* 140*    Coag's  Recent Labs Lab 01/13/16 0330 01/14/16 0428 01/15/16 0500  APTT 35 38* 36  INR 1.80 1.46 1.43    Sepsis Markers No results for input(s): LATICACIDVEN, PROCALCITON, O2SATVEN in the last 168 hours.  ABG  Recent Labs Lab 01/09/16 0336  PHART 7.236*  PCO2ART 62.9*  PO2ART 73.8*    Liver Enzymes  Recent Labs Lab 01/13/16 0330 01/14/16 0428 01/15/16 0500  ALBUMIN 2.2* 2.2* 2.2*    Cardiac Enzymes  Recent Labs Lab 01/09/16 1210 01/09/16 1800 01/10/16 0022  TROPONINI 0.03* 0.03* 0.04*    Glucose  Recent Labs Lab 01/14/16 1259 01/14/16 1559 01/14/16 2056 01/15/16 0045 01/15/16 0407 01/15/16 0814  GLUCAP 172* 177* 222* 189* 183* 164*    Imaging No results found.   STUDIES:  CTA Chest 7/25:  b/l GGO and interlobular septal thickening, bibasilar consolidation, mediastinal LAN TTE 7/26: mild LVH, EF 55 to [id number], grade 1 diastolic CHF, mod AS, mod TR, severe LA dilation, PAS 40 mmHg  Port CXR 8/4:  Tracheostomy & R IJ CVL in good position. Enteric tube below diaphragm.  MICROBIOLOGY: Respiratory Viral Panel PCR 7/15:  Negative  Blood Ctx x4 7/24: 1/4 Coag Neg Staph Tracheal Asp  Ctx 7/24:  Oral Flora Respiratory Viral Panel PCR 7/24:  Negative  Tracheal Asp Ctx 7/27: Normal respiratory flora Blood Ctx 7/27:  Negative  Ear Viral Ctx 8/1 >>  ANTIBIOTICS: Aztreonam 7/24 - 7/25 Imipenem 7/24 - 7/25 Vancomycin 7/24 - 7/25 Levaquin 7/25 - 8/1 Linezolid 7/25 - 8/4 Meropenem 7/25 >> Anidulofungin 7/27 >>   SIGNIFICANT EVENTS: 7/22 - discharge to SNF 7/24 - Admit, intubated for respiratory failure  7/25 - worsening acidosis, off pressors, ARDS protocol, CVVHD initiated 7/26 - Nimbex discontinued  7/30 - hypotension with sedation, vasopressors restarted, ST segment changes  7/31 - vomiting overnight, NGT to suction 8/02 - off pressors 8/03 -  trach  LINES/TUBES: ETT 8.5 7/24 - 8/3 #8 trach 8/3(DF)>> L Subclavian CVL 7/25 >> 8/3 pulled out by pt. R IJ HD CVL 7/25 >>  ASSESSMENT / PLAN:  PULMONARY A: Acute Hypoxic Respiratory Failure - ARDS w/ HCAP. 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 H/O OSA.  P:   Continue PS weaning Goal to advance to Tracheostomy Collar Continue Solu-Medrol 40mg  IV daily Duoneb q6hr Intermittent CXR  CARDIOVASCULAR A:  Abnormal EKG >> ST elevation RCA distribution, resolved spontaneously without hemodynamic change, uncertain etiology. Seen on 7/28 & again 7/30. Suspect demand with hypotension in setting of moderate aortic stenosis. Shock - Likely sedation & CVVHD related. Resolved. H/O CAD, Carotid artery disease, chronic diastolic CHF, HTN, a fib s/p ablation, HLD, mod AS.  P:  Monitoring on telemetry Vitals per unit protocol Hold ASA with concern about gastritis Holding heparin gtt as patient is in sinus rhythm  RENAL A:   Acute on Chronic Renal Failure Stage 3 - Off CVVHD 8/3. Hypernatremia - Mild. Hyperchloremia - Mild.  P:   Nephrology following, appreciate assistance  Trending UOP Monitoring electrolytes & renal function daily Continuing w/ CVVHD holiday Free Water 200cc VT q4hr  GASTROINTESTINAL A:   Ileus vs Constipation - Improved after SMOG enema 8/4. Gastritis.  P:   NPO Continuing Tube Feedings Miralax QD Pepcid IV q24hr Senna bid VT Zofran prn Dulcolax prn  HEMATOLOGIC A:   Coagulopathy - Secondary to Coumadin. Resolved. Thrombocytopenia - Mild. Stable. Anemia - Likely chronic illness. No signs of active bleeding.  P:  Trending cell counts & INR daily Heparin [place] for DVT prophylaxis  Heparin gtt held as patient in sinus rhythm SCDs Transfuse for Hgb <8.0 or signs of active bleeding  INFECTIOUS A:   HCAP. Staph in blood culture - likely contaminate. Candidal scrotal rash. Pressure  ulcer Rt ear.  P:   Abx per ID Monitor cultures as above  ENDOCRINE A:   DM type II - Glucose controlled.  P:   Accu-Checks q4hr SSI per Moderate Algorithm  NEUROLOGIC A:   Acute Encephalopathy - Multifactorial from hypoxia and metabolic.  P:   RASS goal:  0 Versed IV prn Fentanyl IV prn  TODAY'S SUMMARY:  75 year old male with baseline RA and COPD and a recent admission for acute respiratory failure with hypoxemia was admitted on 7/24 with ARDS most likely due to HCAP but there has been question of underlying ILD related to his rheumatoid arthritis.  Has multi-organ failure.  Renal function continuing to improve. Patient's family updated at bedside. Continuing Solu-Medrol IV daily. Continuing intermittent tracheostomy collar 4 hours on and 4 hours off.  I have spent a total of 33 minutes of critical care time today caring for the patient, updating family at bedside, and  reviewing the patient's electronic medical record.  Sonia Baller Ashok Cordia, M.D. [facility] Pager:  [phone] After 3pm or if no response, call [phone] 01/15/2016, 12:00 PM

## 2016-01-15 NOTE — Progress Notes (Signed)
[facility] Progress Note Patient Name: Curtis Frazier DOB: 25-Oct-1940 MRN: [id number]   Date of Service  01/15/2016  HPI/Events of Note  Patient was relative hypomagnesemia at 1.7. Potassium 4.2. Renal function continuing to improve. Mild improvement in tachycardia after a lactated Ringer's IV fluid bolus.   eICU Interventions  Magnesium sulfate 2 g IV now      Intervention Category Intermediate Interventions: Electrolyte abnormality - evaluation and management  Tera Partridge 01/15/2016, 10:52 PM

## 2016-01-15 NOTE — Progress Notes (Signed)
Patient HR increased to 130s sustained. Elink MD notified. Orders for additional 127mcg fentanyl received.  After administration, pt calm, and HR continued to sustained in 130s.  Elink MD notified, no new orders received. Will continue to monitor.

## 2016-01-15 NOTE — Progress Notes (Signed)
[facility] Progress Note Patient Name: JAIZON GRAFFEO DOB: 1940-09-13 MRN: [id number]   Date of Service  01/15/2016  HPI/Events of Note  Nurse reports new and intermittent tachycardia. EKG shows what appears to be sinus tachycardia with occasional PVCs. Patient continuing to have significant urine output.   eICU Interventions  1. LR 250 mL bolus now 2. Stat BMP & magnesium 3. Continue telemetry monitoring     Intervention Category Major Interventions: Arrhythmia - evaluation and management  Tera Partridge 01/15/2016, 10:14 PM

## 2016-01-15 NOTE — Progress Notes (Signed)
Curtis Frazier Progress Note   Subjective: on CRRT holiday- stopped 8/2- making tons of urine- 3700 last 24 hours  Vitals:   01/15/16 0747 01/15/16 0800 01/15/16 0900 01/15/16 1000  BP:  (!) 154/72 (!) 151/123 (!) 120/52  Pulse:  (!) 109    Resp:  (!) 25 (!) 25 (!) 29  Temp:      TempSrc:      SpO2: 95% 96% 96% 91%  Weight:      Height:        Inpatient medications: . anidulafungin  100 mg Intravenous Q24H  . antiseptic oral rinse  7 mL Mouth Rinse QID  . chlorhexidine gluconate (SAGE KIT)  15 mL Mouth Rinse BID  . famotidine (PEPCID) IV  20 mg Intravenous Q12H  . feeding supplement (PRO-STAT SUGAR FREE 64)  30 mL Per Tube Daily  . feeding supplement (VITAL 1.5 CAL)  1,000 mL Per Tube Q24H  . heparin subcutaneous  5,000 Units Subcutaneous Q8H  . insulin aspart  0-15 Units Subcutaneous Q4H  . ipratropium-albuterol  3 mL Nebulization Q6H  . meropenem (MERREM) IV  500 mg Intravenous Q12H  . methylPREDNISolone (SOLU-MEDROL) injection  40 mg Intravenous Daily  . nystatin  5 mL Oral TID  . nystatin   Topical TID  . sennosides  5 mL Per Tube Q12H  . sodium chloride  500 mL Intravenous Once  . sodium chloride flush  10-40 mL Intracatheter Q12H   . dextrose 5 % and 0.9% NaCl 40 mL/hr at 01/14/16 1033  . phenylephrine (NEO-SYNEPHRINE) Adult infusion Stopped (01/13/16 1503)   sodium chloride, albuterol, alteplase, fentaNYL (SUBLIMAZE) injection, fentaNYL (SUBLIMAZE) injection, ipratropium, midazolam, midazolam, ondansetron (ZOFRAN) IV, sodium chloride flush  Exam: Gen on vent, eyes open, no commands  Sclera anicteric, throat w ETT-  Right IJ vascath placed 7/25 No jvd or bruits Chest some post crackles at bases RRR no MRG Abd soft nondistended no ascites or mass GU normal male w Foley Ext no LE or UE edema Neuro as above  CT chest > underlying emphysema w bilat patchy infiltrates/ ground glass changes, LL > UL's CXR 7.25 > patchy bilat infiltrates UA -  negative CVP 10-12  Assessment: 1  Acute on CRF3 - baseline creatinine in the high 1's- ischemic ATN due to shock most likely, CRRT from 7/25 to 8/2, currently on CRRT holiday.   urine output up Frazier lot more last 24 hours and BUN and creatinine actually went down  - no indications to resume CRRT at this time- will give it more time- BUN and creatinine both up but not remarkably so.  vascath in for 9 days will be mindful 2  Recurrent resp failure - ARDS + prob HCAP- per CCM and ID- making some positive movements but now s/p trach on 8/3 3  Volume - CVP variable, stable wts, no vol excess- was running even-  Now with polyuria and sodium climbing- will add some free water to feeds  4  COPD underlying 5  Hx RA/ psor arthriitis/ PMR - chron immunosuppressed for yrs (methotrexate, Remicade/ Humira, pred) 6. Acidosis- improved 7. Elytes- stable  8. Anemia- supportive care only right now- hgb was over 10 - drop today maybe due to procedure - is 9.5    Curtis Frazier  01/15/2016, 11:16 AM    Recent Labs Lab 01/13/16 0330 01/14/16 0428 01/14/16 1225 01/14/16 1700 01/15/16 0500  NA 136 139  --   --  146*  K 4.7 4.9  --   --  4.5  CL 105 108  --   --  112*  CO2 25 26  --   --  29  GLUCOSE 161* 153*  --   --  190*  BUN 46* 63*  --   --  60*  CREATININE 2.35* 2.58*  --   --  2.12*  CALCIUM 8.8* 8.7*  --   --  8.8*  PHOS 4.9* 5.2* 4.0 4.1 3.8  3.8    Recent Labs Lab 01/13/16 0330 01/14/16 0428 01/15/16 0500  ALBUMIN 2.2* 2.2* 2.2*    Recent Labs Lab 01/13/16 0330 01/14/16 0428 01/15/16 0500  WBC 23.1* 20.4* 17.4*  NEUTROABS 20.3* 17.4* 14.8*  HGB 10.3* 9.1* 9.5*  HCT 32.6* 28.3* 29.6*  MCV 94.2 93.7 94.0  PLT 160 131* 140*   Iron/TIBC/Ferritin/ %Sat    Component Value Date/Time   IRON 24 (L) 08/13/2015 0430   TIBC 258 08/13/2015 0430   FERRITIN 147 08/13/2015 0430   IRONPCTSAT 9 (L) 08/13/2015 0430

## 2016-01-15 NOTE — Progress Notes (Signed)
[facility] Progress Note Patient Name: Curtis Frazier DOB: 1941-02-21 MRN: [id number]   Date of Service  01/15/2016  HPI/Events of Note  Notified by bedside nurse of temperature 101.106F. Patient was Central IV access.   eICU Interventions  1. Checking blood cultures 2 2. Portable chest x-ray now 3. Urinalysis with possible urine culture depending upon results 4. Tylenol when necessary fever      Intervention Category Intermediate Interventions: Other:  Tera Partridge 01/15/2016, 4:55 PM

## 2016-01-16 ENCOUNTER — Inpatient Hospital Stay (HOSPITAL_COMMUNITY): Payer: Medicare Other

## 2016-01-16 DIAGNOSIS — E878 Other disorders of electrolyte and fluid balance, not elsewhere classified: Secondary | ICD-10-CM

## 2016-01-16 DIAGNOSIS — E870 Hyperosmolality and hypernatremia: Secondary | ICD-10-CM

## 2016-01-16 DIAGNOSIS — I471 Supraventricular tachycardia: Secondary | ICD-10-CM

## 2016-01-16 DIAGNOSIS — R109 Unspecified abdominal pain: Secondary | ICD-10-CM

## 2016-01-16 LAB — PROTIME-INR
INR: 1.39
PROTHROMBIN TIME: 17.2 s — AB (ref 11.4–15.2)

## 2016-01-16 LAB — CBC WITH DIFFERENTIAL/PLATELET
BASOS PCT: 0 %
Basophils Absolute: 0 10*3/uL (ref 0.0–0.1)
EOS ABS: 0 10*3/uL (ref 0.0–0.7)
Eosinophils Relative: 0 %
HCT: 29.5 % — ABNORMAL LOW (ref 39.0–52.0)
HEMOGLOBIN: 9.1 g/dL — AB (ref 13.0–17.0)
LYMPHS ABS: 1.4 10*3/uL (ref 0.7–4.0)
LYMPHS PCT: 8 %
MCH: 29.3 pg (ref 26.0–34.0)
MCHC: 30.8 g/dL (ref 30.0–36.0)
MCV: 94.9 fL (ref 78.0–100.0)
Monocytes Absolute: 2 10*3/uL — ABNORMAL HIGH (ref 0.1–1.0)
Monocytes Relative: 12 %
NEUTROS ABS: 13.6 10*3/uL — AB (ref 1.7–7.7)
Neutrophils Relative %: 80 %
Platelets: 125 10*3/uL — ABNORMAL LOW (ref 150–400)
RBC: 3.11 MIL/uL — ABNORMAL LOW (ref 4.22–5.81)
RDW: 19 % — ABNORMAL HIGH (ref 11.5–15.5)
WBC: 17 10*3/uL — ABNORMAL HIGH (ref 4.0–10.5)

## 2016-01-16 LAB — HEPATIC FUNCTION PANEL
ALT: 241 U/L — ABNORMAL HIGH (ref 17–63)
AST: 219 U/L — AB (ref 15–41)
Albumin: 1.9 g/dL — ABNORMAL LOW (ref 3.5–5.0)
Alkaline Phosphatase: 257 U/L — ABNORMAL HIGH (ref 38–126)
BILIRUBIN DIRECT: 0.5 mg/dL (ref 0.1–0.5)
BILIRUBIN INDIRECT: 0.6 mg/dL (ref 0.3–0.9)
TOTAL PROTEIN: 5.3 g/dL — AB (ref 6.5–8.1)
Total Bilirubin: 1.1 mg/dL (ref 0.3–1.2)

## 2016-01-16 LAB — RENAL FUNCTION PANEL
ALBUMIN: 2 g/dL — AB (ref 3.5–5.0)
Anion gap: 5 (ref 5–15)
BUN: 58 mg/dL — AB (ref 6–20)
CALCIUM: 8.9 mg/dL (ref 8.9–10.3)
CO2: 31 mmol/L (ref 22–32)
CREATININE: 1.8 mg/dL — AB (ref 0.61–1.24)
Chloride: 116 mmol/L — ABNORMAL HIGH (ref 101–111)
GFR, EST AFRICAN AMERICAN: 40 mL/min — AB (ref >60)
GFR, EST NON AFRICAN AMERICAN: 35 mL/min — AB (ref >60)
Glucose, Bld: 169 mg/dL — ABNORMAL HIGH (ref 65–99)
PHOSPHORUS: 2.8 mg/dL (ref 2.5–4.6)
Potassium: 4.2 mmol/L (ref 3.5–5.1)
SODIUM: 152 mmol/L — AB (ref 135–145)

## 2016-01-16 LAB — APTT: APTT: 33 s (ref 24–36)

## 2016-01-16 LAB — MAGNESIUM: MAGNESIUM: 2.1 mg/dL (ref 1.7–2.4)

## 2016-01-16 LAB — GLUCOSE, CAPILLARY
GLUCOSE-CAPILLARY: 175 mg/dL — AB (ref 65–99)
GLUCOSE-CAPILLARY: 202 mg/dL — AB (ref 65–99)
GLUCOSE-CAPILLARY: 164 mg/dL — AB (ref 65–99)
Glucose-Capillary: 218 mg/dL — ABNORMAL HIGH (ref 65–99)
Glucose-Capillary: 233 mg/dL — ABNORMAL HIGH (ref 65–99)
Glucose-Capillary: 265 mg/dL — ABNORMAL HIGH (ref 65–99)

## 2016-01-16 LAB — LIPASE, BLOOD: Lipase: 37 U/L (ref 11–51)

## 2016-01-16 LAB — AMYLASE: AMYLASE: 51 U/L (ref 28–100)

## 2016-01-16 MED ORDER — LACTATED RINGERS IV BOLUS (SEPSIS)
500.0000 mL | Freq: Once | INTRAVENOUS | Status: AC
  Administered 2016-01-16: 500 mL via INTRAVENOUS

## 2016-01-16 MED ORDER — FLEET ENEMA 7-19 GM/118ML RE ENEM
1.0000 | ENEMA | Freq: Once | RECTAL | Status: AC
  Administered 2016-01-16: 1 via RECTAL
  Filled 2016-01-16: qty 1

## 2016-01-16 MED ORDER — FREE WATER
400.0000 mL | Status: DC
  Administered 2016-01-16 – 2016-01-17 (×5): 400 mL

## 2016-01-16 MED ORDER — SODIUM CHLORIDE 0.45 % IV SOLN
INTRAVENOUS | Status: AC
  Administered 2016-01-16 – 2016-01-19 (×4): via INTRAVENOUS

## 2016-01-16 MED ORDER — RANITIDINE HCL 150 MG/10ML PO SYRP
150.0000 mg | ORAL_SOLUTION | Freq: Two times a day (BID) | ORAL | Status: DC
  Administered 2016-01-16 – 2016-01-19 (×6): 150 mg
  Filled 2016-01-16 (×6): qty 10

## 2016-01-16 MED ORDER — METOPROLOL TARTRATE 5 MG/5ML IV SOLN
2.5000 mg | Freq: Once | INTRAVENOUS | Status: AC
  Administered 2016-01-16: 2.5 mg via INTRAVENOUS
  Filled 2016-01-16: qty 5

## 2016-01-16 NOTE — Progress Notes (Signed)
Pt. HR increased to 130-140's sustained. Was notified by central telemetry monitoring of possible rhythm change. EKG done. On call physician with E-link was called and notified. New orders were written. Patient eventually had to be placed on Cardizem drip.

## 2016-01-16 NOTE — Progress Notes (Signed)
[facility] Progress Note   Subjective: another 2100 of UOP - some tachycardia and hypotension - also sodium going up   Vitals:   01/16/16 0659 01/16/16 0749 01/16/16 0800 01/16/16 0839  BP: (!) 81/46     Pulse:    95  Resp: 20   (!) 27  Temp:   99.9 F (37.7 C)   TempSrc:      SpO2: 96% 97%  96%  Weight:      Height:        Inpatient medications: . anidulafungin  100 mg Intravenous Q24H  . antiseptic oral rinse  7 mL Mouth Rinse QID  . chlorhexidine gluconate (SAGE KIT)  15 mL Mouth Rinse BID  . feeding supplement (PRO-STAT SUGAR FREE 64)  30 mL Per Tube Daily  . feeding supplement (VITAL 1.5 CAL)  1,000 mL Per Tube Q24H  . free water  400 mL Per Tube Q4H  . heparin subcutaneous  5,000 Units Subcutaneous Q8H  . insulin aspart  0-15 Units Subcutaneous Q4H  . ipratropium-albuterol  3 mL Nebulization Q6H  . methylPREDNISolone (SOLU-MEDROL) injection  40 mg Intravenous Daily  . nystatin  5 mL Oral TID  . nystatin   Topical TID  . ranitidine  150 mg Per Tube BID  . sennosides  5 mL Per Tube Q12H  . sodium chloride  500 mL Intravenous Once  . sodium chloride flush  10-40 mL Intracatheter Q12H   . sodium chloride 50 mL/hr at 01/16/16 0800   sodium chloride, acetaminophen (TYLENOL) oral liquid 160 mg/5 mL, albuterol, alteplase, fentaNYL (SUBLIMAZE) injection, ipratropium, midazolam, ondansetron (ZOFRAN) IV, sodium chloride flush  Exam: Gen on vent, eyes open, no commands  Sclera anicteric, throat w ETT-  Right IJ vascath placed 7/25 No jvd or bruits Chest some post crackles at bases RRR no MRG Abd soft nondistended no ascites or mass GU normal male w Foley Ext no LE or UE edema Neuro as above  CT chest > underlying emphysema w bilat patchy infiltrates/ ground glass changes, LL > UL's CXR 7.25 > patchy bilat infiltrates UA - negative CVP 10-12  Assessment: 1  Acute on CRF3 - baseline creatinine in the high 1's- ischemic ATN due to shock most likely,  CRRT from 7/25 to 8/2, currently on CRRT holiday.   Nonoliguric and BUN/creatinine coming down  - no indications to resume CRRT at this time-   vascath in for 10 days will be mindful 2  Recurrent resp failure - ARDS + prob HCAP- per CCM and ID- making some positive movements but now s/p trach on 8/3 3  Volume - CVP variable, stable wts, no vol excess- was running even-  Now with  sodium climbing- I see that free water volume has been increased and also started on 1/2 NS- I agree  4  COPD underlying 5  Hx RA/ psor arthriitis/ PMR - chron immunosuppressed for yrs (methotrexate, Remicade/ Humira, pred) 6. Acidosis- improved 7. Elytes- stable  8. Anemia- supportive care only right now- hgb was over 10 - drop today maybe due to procedure - is 9.1    , A  01/16/2016, 11:24 AM    Recent Labs Lab 01/14/16 1700 01/15/16 0500 01/15/16 2214 01/16/16 0418  NA  --  146* 148* 152*  K  --  4.5 4.2 4.2  CL  --  112* 115* 116*  CO2  --  29 29 31  GLUCOSE  --  190* 300* 169*  BUN  --    60* 64* 58*  CREATININE  --  2.12* 1.97* 1.80*  CALCIUM  --  8.8* 8.9 8.9  PHOS 4.1 3.8  3.8  --  2.8    Recent Labs Lab 01/14/16 0428 01/15/16 0500 01/16/16 0418  AST  --   --  219*  ALT  --   --  241*  ALKPHOS  --   --  257*  BILITOT  --   --  1.1  PROT  --   --  5.3*  ALBUMIN 2.2* 2.2* 1.9*  2.0*    Recent Labs Lab 01/14/16 0428 01/15/16 0500 01/16/16 0418  WBC 20.4* 17.4* 17.0*  NEUTROABS 17.4* 14.8* 13.6*  HGB 9.1* 9.5* 9.1*  HCT 28.3* 29.6* 29.5*  MCV 93.7 94.0 94.9  PLT 131* 140* 125*   Iron/TIBC/Ferritin/ %Sat    Component Value Date/Time   IRON 24 (L) 08/13/2015 0430   TIBC 258 08/13/2015 0430   FERRITIN 147 08/13/2015 0430   IRONPCTSAT 9 (L) 08/13/2015 0430     

## 2016-01-16 NOTE — Progress Notes (Signed)
PULMONARY / CRITICAL CARE MEDICINE   Name: Curtis Frazier MRN: [id number] DOB: 05/14/1941    ADMISSION DATE:  01/03/2016 CONSULTATION DATE:  01/03/2016  REFERRING MD:  Dr. Dayna Barker EDP  CHIEF COMPLAINT:  SOB/AMS  HISTORY:  75 y/o male presented with lethargy, hypoxia, hypotension from ARDS with concern for HCAP versus acute pneumonitis with hx of rheumatoid arthritis..  SUBJECTIVE:   Patient had persistent tachycardia overnight that appeared to be atrial fibrillation with RVR and was started on a Cardizem drip. Febrile on 8/5 prompting CXR, blood cultures, and UA. This morning patient reporting increased abdominal discomfort with head nod.  REVIEW OF SYSTEMS: Unable to assess given the ventilator and tracheostomy.  VITAL SIGNS: BP (!) 72/41   Pulse 96   Temp (!) 101.3 F (38.5 C) (Core (Comment))   Resp (!) 22   Ht 5\' 8"  (1.727 m)   Wt 145 lb 1 oz (65.8 kg)   SpO2 96%   BMI 22.06 kg/m   HEMODYNAMICS:    VENTILATOR SETTINGS: Vent Mode: PRVC FiO2 (%):  [40 %] 40 % Set Rate:  [18 bmp] 18 bmp Vt Set:  [500 mL] 500 mL PEEP:  [5 cmH20] 5 cmH20 Pressure Support:  [12 cmH20] 12 cmH20 Plateau Pressure:  [13 cmH20-14 cmH20] 14 cmH20  INTAKE / OUTPUT: I/O last 3 completed shifts: In: 2107.5 [I.V.:960; Other:60; NG/GT:837.5; IV Piggyback:250] Out: [id number] [Urine:4050]  PHYSICAL EXAMINATION: General: No distress. Sleeping until awoken. No family at bedside. Neuro: Moving all 4 extremities spontaneously. Seems to nod appropriately to questions but does not consistently follow commands. Pupils symmetric. HEENT: Tracheostomy in place. Moist mucous membranes. No scleral icterus. Cardiovascular: Regular rate and rhythm. Sinus rhythm on telemetry. No appreciable JVD. Pulmonary: Normal work of breathing on full ventilator support. Symmetric chest wall rise. Improving bilateral breath sounds. Abdomen: Soft. Protuberant. Hypoactive bowel sounds. Diffusely tender to palpation. Integument: Warm  and dry. No rash on exposed skin.  LABS:  BMET  Recent Labs Lab 01/15/16 0500 01/15/16 2214 01/16/16 0418  NA 146* 148* 152*  K 4.5 4.2 4.2  CL 112* 115* 116*  CO2 29 29 31   BUN 60* 64* 58*  CREATININE 2.12* 1.97* 1.80*  GLUCOSE 190* 300* 169*    Electrolytes  Recent Labs Lab 01/14/16 1700 01/15/16 0500 01/15/16 2214 01/16/16 0418  CALCIUM  --  8.8* 8.9 8.9  MG  --  1.8 1.7 2.1  PHOS 4.1 3.8  3.8  --  2.8    CBC  Recent Labs Lab 01/14/16 0428 01/15/16 0500 01/16/16 0418  WBC 20.4* 17.4* 17.0*  HGB 9.1* 9.5* 9.1*  HCT 28.3* 29.6* 29.5*  PLT 131* 140* 125*    Coag's  Recent Labs Lab 01/14/16 0428 01/15/16 0500 01/16/16 0418  APTT 38* 36 33  INR 1.46 1.43 1.39    Sepsis Markers No results for input(s): LATICACIDVEN, PROCALCITON, O2SATVEN in the last 168 hours.  ABG No results for input(s): PHART, PCO2ART, PO2ART in the last 168 hours.  Liver Enzymes  Recent Labs Lab 01/14/16 0428 01/15/16 0500 01/16/16 0418  ALBUMIN 2.2* 2.2* 2.0*    Cardiac Enzymes  Recent Labs Lab 01/09/16 1210 01/09/16 1800 01/10/16 0022  TROPONINI 0.03* 0.03* 0.04*    Glucose  Recent Labs Lab 01/15/16 1120 01/15/16 1537 01/15/16 1539 01/15/16 2011 01/15/16 2337 01/16/16 0533  GLUCAP 167* 263* 286* 281* 265* 175*    Imaging Dg Chest Port 1 View  Result Date: 01/15/2016 CLINICAL DATA:  Fever of unknown origin. History of  COPD and pneumonia. EXAM: PORTABLE CHEST 1 VIEW COMPARISON:  Multiple recent prior chest radiographs, the most recent dated 01/14/2016. CT chest 12/24/2005 FINDINGS: Orogastric tube terminates in the proximal stomach, in satisfactory position. Right IJ dialysis catheter projects over the expected location of the superior vena cava. Tracheostomy is in satisfactory position. Low lung volumes bilaterally. There are faint patchy opacities in the periphery of the left mid lung and left lung base and at the right lung base. Overall, aeration  of the lungs appears improved compared to 01/14/2016, particularly at the left lung base. Heart and mediastinal contours are stable. Harrington rods visualize lower thoracic spine. Bony demineralization noted. IMPRESSION: Improving aeration of the lungs compared to chest radiograph of 01/14/2016. There are some persistent mild patchy opacities bilateral. Satisfactory position of visualized support devices. Electronically Signed   By: Curlene Dolphin M.D.   On: 01/15/2016 18:26     STUDIES:  CTA Chest 7/25:  b/l GGO and interlobular septal thickening, bibasilar consolidation, mediastinal LAN TTE 7/26: mild LVH, EF 55 to [id number], grade 1 diastolic CHF, mod AS, mod TR, severe LA dilation, PAS 40 mmHg  Port CXR 8/4:  Tracheostomy & R IJ CVL in good position. Enteric tube below diaphragm. Port CXR 8/5: Low lung volumes with slightly lordotic view. Improving bilateral opacities with decreased silhouetting left hemidiaphragm. The central venous catheter, enteric tube, and tracheostomy in good position. No new opacity appreciated.  MICROBIOLOGY: Respiratory Viral Panel PCR 7/15:  Negative  Blood Ctx x4 7/24: 1/4 Coag Neg Staph Tracheal Asp Ctx 7/24:  Oral Flora Respiratory Viral Panel PCR 7/24:  Negative  Tracheal Asp Ctx 7/27: Normal respiratory flora Blood Ctx 7/27:  Negative  Ear Viral Ctx 8/1 >> Blood Ctx x2 8/5 >>  ANTIBIOTICS: Aztreonam 7/24 - 7/25 Imipenem 7/24 - 7/25 Vancomycin 7/24 - 7/25 Levaquin 7/25 - 8/1 Linezolid 7/25 - 8/4 Meropenem 7/25 - 8/5 Anidulofungin 7/27 >>   SIGNIFICANT EVENTS: 7/22 - discharge to SNF 7/24 - Admit, intubated for respiratory failure  7/25 - worsening acidosis, off pressors, ARDS protocol, CVVHD initiated 7/26 - Nimbex discontinued  7/30 - hypotension with sedation, vasopressors restarted, ST segment changes  7/31 - vomiting overnight, NGT to suction 8/02 - off pressors 8/03 - trach 8/04 - SVT vs Sinus Tach w/ PVCs along w/ new fever  LINES/TUBES: ETT  8.5 7/24 - 8/3 #8 trach 8/3(DF)>> L Subclavian CVL 7/25 >> 8/3 pulled out by pt. R IJ HD CVL 7/25 >>  ASSESSMENT / PLAN:  PULMONARY A: Acute Hypoxic Respiratory Failure - ARDS w/ HCAP. 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 H/O OSA.  P:   Continue Tracheostomy Collar q4hr on with Full Vent Support for rest for 4 hours Continue Solu-Medrol 40mg  IV daily Duoneb q6hr Intermittent CXR  CARDIOVASCULAR A:  Abnormal EKG >> ST elevation RCA distribution, resolved spontaneously without hemodynamic change, uncertain etiology. Seen on 7/28 & again 7/30. Suspect demand with hypotension in setting of moderate aortic stenosis. Shock - Likely sedation & CVVHD related. Resolved. H/O CAD, Carotid artery disease, chronic diastolic CHF, HTN, a fib s/p ablation, HLD, mod AS.  P:  Monitoring on telemetry Vitals per unit protocol Hold ASA with concern about gastritis Holding heparin gtt  D/C Diltiazem gtt If further rhythm problems plan for Lopressor  RENAL A:   Acute on Chronic Renal Failure Stage 3 - Off CVVHD 8/3. Hypernatremia - Worsening. Free Water Deficit 2L Hyperchloremia - Worsening.  P:  Nephrology following, appreciate assistance  Trending UOP Monitoring electrolytes & renal function daily Continuing w/ CVVHD holiday Increase Free Water to 400cc VT q4hr D/C D5NS Starting 1/2 NS 50cc/hr  GASTROINTESTINAL A:   Abdominal Pain Ileus vs Constipation - Improved after SMOG enema 8/4. Gastritis.  P:   NPO Holding Tube Feedings Pepcid IV q24hr Senna bid VT Zofran prn Checking CT Abd/Pelvis w/o contrast Checking hepatic function panel, lipase, and amylase  HEMATOLOGIC A:   Coagulopathy - Secondary to Coumadin. Resolved. Thrombocytopenia - Mild. Stable. Anemia - Likely chronic illness. Hgb stable. No signs of active bleeding.  P:  Trending cell counts & INR daily Heparin [place] for DVT prophylaxis  Heparin gtt  held  SCDs Transfuse for Hgb <8.0 or signs of active bleeding  INFECTIOUS A:   HCAP. Staph in blood culture - likely contaminate. Candidal scrotal rash. Pressure ulcer Rt ear FUO - UA unimpressive & Blood Cultures obtained  P:   Abx per ID Monitor cultures as above  ENDOCRINE A:   DM type II - Glucose controlled.  P:   Accu-Checks q4hr SSI per Moderate Algorithm  NEUROLOGIC A:   Acute Encephalopathy - Multifactorial from hypoxia and metabolic. Improving.  P:   RASS goal:  0 Limiting sedating medications Versed IV prn Fentanyl IV prn  FAMILY UPDATE: Wife, son, and other family members updated on 8/5 by Dr. Milinda Hirschfeld at bedside. No family at bedside 8-6.  TODAY'S SUMMARY:  75 year old male with baseline RA and COPD and a recent admission for acute respiratory failure with hypoxemia was admitted on 7/24 with ARDS most likely due to HCAP but there has been question of underlying ILD related to his rheumatoid arthritis.  Has multi-organ failure.  Renal function is improving. Patient did have tachycardia overnight and currently is in normal sinus rhythm. Discontinuing diltiazem due to potential worsening of ileus/gastric motility. Checking CT scan of abdomen without oral or IV contrast to assess abdominal pain. Continuing intermittent tracheostomy collar with full ventilator support.   I have spent a total of 35 minutes of critical care time today caring for the patient and reviewing the patient's electronic medical record.  Sonia Baller Ashok Cordia, M.D. [facility] Pulmonary & Critical Care Pager:  [phone] After 3pm or if no response, call [phone] 01/16/2016, 7:05 AM

## 2016-01-16 NOTE — Progress Notes (Signed)
Nutrition Follow-up  DOCUMENTATION CODES:   Non-severe (moderate) malnutrition in context of acute illness/injury  INTERVENTION:   Once medically able: -Continue Vital 1.5 @ 50 mL/hr  -30 mL Prostat  -Continue 400 ml free water every 4 hours -This regimen provides 1800 kcal, 96g protein and 2517 ml H2O.  - RD will follow-up 8/7 and place PEPUP protocol if appropriate at that time.  NUTRITION DIAGNOSIS:   Inadequate oral intake related to inability to eat as evidenced by NPO status.  Ongoing.  GOAL:   Patient will meet greater than or equal to 90% of their needs  Not meeting.  MONITOR:   Vent status, TF tolerance, Weight trends, Labs, Skin, I & O's  ASSESSMENT:   75 year old male with PMH as below which is significant for Diastolic CHF, CKD, COPD, PAF (s/p recent ablation), and rheumatoid arthritis (recently on remicade, now on chronic pred 5mg ). He has several recent hospitalizations. Most recently he was admitted 7/15 for presumed HCAP. He was streated with 9 days of HCAP antibiotics and was transferred to [facility] 7/22. 7/24 he was noted to have AMS to the point where he was essentially comatose. O2 sats noted to be about 60% on room air at that time. He was transported to [facility] ED for further evaluation where he was placed on BiPAP with some improvement. CXR was performed which is consistent with interstitial lung changes noted on very recent CT.  Pt continues Nutritional therapist on with Full Vent Support for rest for 4 hours. Pt continuing CRRT holiday.  Per pt's daughter in room, pt had a CT scan this morning d/t continued abdominal pain. Tube feeding was turned off for this procedure. Plan is to hold tube feedings per MD.  Pt was tolerating Vital 1.5 @ goal of 50 ml/hr with 30 ml Prostat prior to hold.  Patient is currently intubated on ventilator support MV: 11.1 L/min Temp (24hrs), Avg:100.8 F (38.2 C), Min:99.9 F (37.7 C), Max:101.5 F (38.6 C)   Medications: Free  water 400 ml every 4 hours Labs reviewed: CBGs: 164-202 Elevated Na Mg/Phos WNL  Diet Order:     Skin:  Wound (see comment) (L ear bruising)  DTI on sacrum  Last BM:  8/4  Height:   Ht Readings from Last 1 Encounters:  01/05/16 5\' 8"  (1.727 m)    Weight:   Wt Readings from Last 1 Encounters:  01/16/16 145 lb 1 oz (65.8 kg)    Ideal Body Weight:  70 kg (kg)  BMI:  Body mass index is 22.06 kg/m.  Estimated Nutritional Needs:   Kcal:  1891  Protein:  90-100g  Fluid:  1.8L/day  EDUCATION NEEDS:   No education needs identified at this time  Clayton Bibles, MS, RD, LDN Pager: [phone] After Hours Pager: [phone]

## 2016-01-17 LAB — RENAL FUNCTION PANEL
Albumin: 1.9 g/dL — ABNORMAL LOW (ref 3.5–5.0)
Anion gap: 4 — ABNORMAL LOW (ref 5–15)
BUN: 56 mg/dL — ABNORMAL HIGH (ref 6–20)
CALCIUM: 8.5 mg/dL — AB (ref 8.9–10.3)
CO2: 29 mmol/L (ref 22–32)
CREATININE: 1.57 mg/dL — AB (ref 0.61–1.24)
Chloride: 113 mmol/L — ABNORMAL HIGH (ref 101–111)
GFR calc Af Amer: 48 mL/min — ABNORMAL LOW (ref >60)
GFR calc non Af Amer: 41 mL/min — ABNORMAL LOW (ref >60)
GLUCOSE: 96 mg/dL (ref 65–99)
Phosphorus: 2.8 mg/dL (ref 2.5–4.6)
Potassium: 3.9 mmol/L (ref 3.5–5.1)
SODIUM: 146 mmol/L — AB (ref 135–145)

## 2016-01-17 LAB — GLUCOSE, CAPILLARY
GLUCOSE-CAPILLARY: 96 mg/dL (ref 65–99)
GLUCOSE-CAPILLARY: 166 mg/dL — AB (ref 65–99)
GLUCOSE-CAPILLARY: 206 mg/dL — AB (ref 65–99)
GLUCOSE-CAPILLARY: 175 mg/dL — AB (ref 65–99)
Glucose-Capillary: 131 mg/dL — ABNORMAL HIGH (ref 65–99)
Glucose-Capillary: 182 mg/dL — ABNORMAL HIGH (ref 65–99)
Glucose-Capillary: 194 mg/dL — ABNORMAL HIGH (ref 65–99)

## 2016-01-17 LAB — CBC WITH DIFFERENTIAL/PLATELET
Basophils Absolute: 0 10*3/uL (ref 0.0–0.1)
Basophils Relative: 0 %
EOS PCT: 0 %
Eosinophils Absolute: 0 10*3/uL (ref 0.0–0.7)
HEMATOCRIT: 28.1 % — AB (ref 39.0–52.0)
Hemoglobin: 8.7 g/dL — ABNORMAL LOW (ref 13.0–17.0)
LYMPHS ABS: 1.7 10*3/uL (ref 0.7–4.0)
Lymphocytes Relative: 10 %
MCH: 29.6 pg (ref 26.0–34.0)
MCHC: 31 g/dL (ref 30.0–36.0)
MCV: 95.6 fL (ref 78.0–100.0)
MONOS PCT: 9 %
Monocytes Absolute: 1.5 10*3/uL — ABNORMAL HIGH (ref 0.1–1.0)
NEUTROS ABS: 13.7 10*3/uL — AB (ref 1.7–7.7)
Neutrophils Relative %: 81 %
Platelets: 108 10*3/uL — ABNORMAL LOW (ref 150–400)
RBC: 2.94 MIL/uL — AB (ref 4.22–5.81)
RDW: 18.9 % — AB (ref 11.5–15.5)
WBC: 16.9 10*3/uL — AB (ref 4.0–10.5)

## 2016-01-17 LAB — MAGNESIUM: Magnesium: 1.6 mg/dL — ABNORMAL LOW (ref 1.7–2.4)

## 2016-01-17 LAB — APTT: aPTT: 31 seconds (ref 24–36)

## 2016-01-17 LAB — HEPATIC FUNCTION PANEL
ALBUMIN: 2 g/dL — AB (ref 3.5–5.0)
ALK PHOS: 293 U/L — AB (ref 38–126)
ALT: 190 U/L — AB (ref 17–63)
AST: 133 U/L — AB (ref 15–41)
BILIRUBIN INDIRECT: 0.9 mg/dL (ref 0.3–0.9)
Bilirubin, Direct: 0.5 mg/dL (ref 0.1–0.5)
TOTAL PROTEIN: 5.1 g/dL — AB (ref 6.5–8.1)
Total Bilirubin: 1.4 mg/dL — ABNORMAL HIGH (ref 0.3–1.2)

## 2016-01-17 LAB — PROTIME-INR
INR: 1.29
Prothrombin Time: 16.2 seconds — ABNORMAL HIGH (ref 11.4–15.2)

## 2016-01-17 MED ORDER — MAGNESIUM SULFATE 50 % IJ SOLN
3.0000 g | Freq: Once | INTRAVENOUS | Status: AC
  Administered 2016-01-17: 3 g via INTRAVENOUS
  Filled 2016-01-17: qty 6

## 2016-01-17 MED ORDER — METOPROLOL TARTRATE 25 MG/10 ML ORAL SUSPENSION
12.5000 mg | Freq: Two times a day (BID) | ORAL | Status: DC
  Administered 2016-01-17 – 2016-01-19 (×4): 12.5 mg
  Filled 2016-01-17 (×5): qty 5

## 2016-01-17 MED ORDER — ARFORMOTEROL TARTRATE 15 MCG/2ML IN NEBU
15.0000 ug | INHALATION_SOLUTION | Freq: Two times a day (BID) | RESPIRATORY_TRACT | Status: DC
  Administered 2016-01-17 – 2016-01-19 (×4): 15 ug via RESPIRATORY_TRACT
  Filled 2016-01-17 (×4): qty 2

## 2016-01-17 MED ORDER — LACTATED RINGERS IV BOLUS (SEPSIS)
500.0000 mL | Freq: Once | INTRAVENOUS | Status: AC
  Administered 2016-01-17: 500 mL via INTRAVENOUS

## 2016-01-17 MED ORDER — BUDESONIDE 0.5 MG/2ML IN SUSP
0.5000 mg | Freq: Two times a day (BID) | RESPIRATORY_TRACT | Status: DC
Start: 2016-01-17 — End: 2016-01-19
  Administered 2016-01-17 – 2016-01-19 (×4): 0.5 mg via RESPIRATORY_TRACT
  Filled 2016-01-17 (×4): qty 2

## 2016-01-17 MED ORDER — METHYLNALTREXONE BROMIDE 12 MG/0.6ML ~~LOC~~ SOLN
6.0000 mg | Freq: Once | SUBCUTANEOUS | Status: AC
  Administered 2016-01-17: 6 mg via SUBCUTANEOUS
  Filled 2016-01-17: qty 0.6

## 2016-01-17 MED ORDER — SORBITOL 70 % SOLN
960.0000 mL | TOPICAL_OIL | Freq: Once | ORAL | Status: AC
  Administered 2016-01-17: 960 mL via RECTAL
  Filled 2016-01-17: qty 240

## 2016-01-17 MED ORDER — LACTATED RINGERS IV BOLUS (SEPSIS)
1000.0000 mL | Freq: Once | INTRAVENOUS | Status: AC
  Administered 2016-01-17: 1000 mL via INTRAVENOUS

## 2016-01-17 NOTE — Progress Notes (Addendum)
Patient ID: Curtis Frazier, male   DOB: 03/12/1941, 75 y.o.   MRN: [id number]  RN calling eMD  - wants to know if free water can be held because free water 416mL is causing discomfort - per RN abd distended, has constiopation, CT shows stool but no obstruction, not responded to laaxatives/enema - CT report below - has been on opioids all along since admit 01/03/2016 with los 14 days   Currently wegith 65.8KG  PLAN Dc laxatives Try 1 dose methylnaltrexone 6mg  SQ x 1 - d/w pharmacy. Called wife but [facility]     Recent Labs Lab 01/13/16 0330 01/14/16 0428 01/15/16 0500 01/15/16 2214 01/16/16 0418  CREATININE 2.35* 2.58* 2.12* 1.97* 1.80*    Estimated Creatinine Clearance: 32.5 mL/min (by C-G formula based on SCr of 1.8 mg/dL).   Estimated body mass index is 22.06 kg/m as calculated from the following:   Height as of this encounter: 5\' 8"  (1.727 m).   Weight as of this encounter: 65.8 kg (145 lb 1 oz).   Ct Abdomen Pelvis Wo Contrast  Result Date: 01/16/2016 CLINICAL DATA:  Respiratory failure and hypoxemia. EXAM: CT ABDOMEN AND PELVIS WITHOUT CONTRAST TECHNIQUE: Multidetector CT imaging of the abdomen and pelvis was performed following the standard protocol without IV contrast. COMPARISON:  None. FINDINGS: Lower chest: Multifocal airspace opacities are identified within both lower lobes compatible with multifocal infection and/or aspiration. Aortic atherosclerosis is noted. Calcifications within the RCA, LAD and left circumflex coronary arteries noted. Hepatobiliary: There is no focal liver abnormality. Hyperdense material within the gallbladder is noted. Pancreas: No mass or inflammatory process identified on this un-enhanced exam. Spleen: Within normal limits in size. Adrenals/Urinary Tract: The adrenal glands appear normal. Normal appearance of the right kidney. Stone within the left kidney measures 3 mm. There are 2 stones identified within the right posterior bladder measuring up to 4  mm. No hydroureter or hydronephrosis identified bilaterally. There is a Foley catheter noted within the urinary bladder. Stomach/Bowel: No pathologic dilatation of the small bowel loops. There is a a marked amount of stool identified within the diffusely distended colon. No pneumatosis or evidence of bowel perforation. Vascular/Lymphatic: Calcified atherosclerotic disease involves the abdominal aorta. No aneurysm. No enlarged retroperitoneal or mesenteric adenopathy. No enlarged pelvic or inguinal lymph nodes. Reproductive: No mass or other significant abnormality. Other: No pneumatosis. There is no ascites or focal fluid collections within the abdomen or pelvis. Musculoskeletal: Posterior rods and screws extend from the lower thoracic through the lumbar spine. IMPRESSION: 1. Diffuse gaseous distension of the colon which contains a moderate to marked stool burden. Chronic colonic findings are favored to represent constipation. Colonic ileus and or distal bowel obstruction is considered less favored but not excluded. 2. Bilateral lower lobe airspace opacities which may reflect pneumonia and or aspiration. 3. Aortic atherosclerosis and multi vessel coronary artery calcification. 4. Nephrolithiasis and bladder calculi. Electronically Signed   By: Kerby Moors M.D.   On: 01/16/2016 10:18   D

## 2016-01-17 NOTE — Progress Notes (Signed)
INFECTIOUS DISEASE PROGRESS NOTE  ID: Curtis Frazier is a 75 y.o. male with  Active Problems:   Acute respiratory failure with hypoxemia ([place])   Visit for intubation   ARDS (adult respiratory distress syndrome) (HCC)   Malnutrition of moderate degree   abtx say #11 : [place] #12  Subjective: Afebrile today. He maintains to have good urine output since stopping CRRT last week with good renal recovery. Still having significant constipation. Enema ordered today. He reports mild abdominal pain Abtx:  Anti-infectives    Start     Dose/Rate Route Frequency Ordered Stop   01/14/16 2200  meropenem (MERREM) 500 mg in sodium chloride 0.9 % 50 mL IVPB     500 mg 100 mL/hr over 30 Minutes Intravenous Every 12 hours 01/14/16 1012 01/16/16 0054   01/07/16 1700  anidulafungin (ERAXIS) 100 mg in sodium chloride 0.9 % 100 mL IVPB     100 mg over 90 Minutes Intravenous Every 24 hours 01/06/16 1537 01/16/16 1751   01/06/16 1630  anidulafungin (ERAXIS) 200 mg in sodium chloride 0.9 % 200 mL IVPB     200 mg over 180 Minutes Intravenous  Once 01/06/16 1535 01/06/16 1954   01/05/16 2200  meropenem (MERREM) 1 g in sodium chloride 0.9 % 100 mL IVPB  Status:  Discontinued     1 g 200 mL/hr over 30 Minutes Intravenous Every 12 hours 01/05/16 1023 01/14/16 1012   01/05/16 1030  meropenem (MERREM) 500 mg in sodium chloride 0.9 % 50 mL IVPB     500 mg 100 mL/hr over 30 Minutes Intravenous STAT 01/05/16 1023 01/05/16 1125   01/05/16 0100  meropenem (MERREM) 500 mg in sodium chloride 0.9 % 50 mL IVPB  Status:  Discontinued     500 mg 100 mL/hr over 30 Minutes Intravenous Every 8 hours 01/04/16 1620 01/05/16 1023   01/04/16 2200  linezolid (ZYVOX) IVPB 600 mg  Status:  Discontinued     600 mg 300 mL/hr over 60 Minutes Intravenous Every 12 hours 01/04/16 1555 01/14/16 1422   01/04/16 2000  vancomycin (VANCOCIN) IVPB 1000 mg/200 mL premix  Status:  Discontinued     1,000 mg 200 mL/hr over 60 Minutes  Intravenous Every 24 hours 01/03/16 2016 01/04/16 0730   01/04/16 2000  vancomycin (VANCOCIN) IVPB 750 mg/150 ml premix  Status:  Discontinued     750 mg 150 mL/hr over 60 Minutes Intravenous Every 24 hours 01/04/16 0730 01/04/16 1555   01/04/16 1800  imipenem-cilastatin (PRIMAXIN) 500 mg in sodium chloride 0.9 % 100 mL IVPB  Status:  Discontinued     500 mg 200 mL/hr over 30 Minutes Intravenous Every 12 hours 01/04/16 0730 01/04/16 1555   01/04/16 1700  levofloxacin (LEVAQUIN) IVPB 500 mg  Status:  Discontinued     500 mg 100 mL/hr over 60 Minutes Intravenous Every 24 hours 01/04/16 1555 01/11/16 0843   01/04/16 1700  meropenem (MERREM) 1 g in sodium chloride 0.9 % 100 mL IVPB     1 g 200 mL/hr over 30 Minutes Intravenous  Once 01/04/16 1619 01/04/16 1712   01/04/16 0400  imipenem-cilastatin (PRIMAXIN) 500 mg in sodium chloride 0.9 % 100 mL IVPB  Status:  Discontinued     500 mg 200 mL/hr over 30 Minutes Intravenous Every 8 hours 01/03/16 2016 01/04/16 0730   01/03/16 2000  imipenem-cilastatin (PRIMAXIN) 500 mg in sodium chloride 0.9 % 100 mL IVPB     500 mg 200 mL/hr over 30 Minutes Intravenous  STAT 01/03/16 1953 01/03/16 2114   01/03/16 1830  vancomycin (VANCOCIN) IVPB 1000 mg/200 mL premix     1,000 mg 200 mL/hr over 60 Minutes Intravenous  Once 01/03/16 1819 01/03/16 2043      Medications:  Scheduled: . antiseptic oral rinse  7 mL Mouth Rinse QID  . arformoterol  15 mcg Nebulization BID  . budesonide (PULMICORT) nebulizer solution  0.5 mg Nebulization BID  . chlorhexidine gluconate (SAGE KIT)  15 mL Mouth Rinse BID  . feeding supplement (PRO-STAT SUGAR FREE 64)  30 mL Per Tube Daily  . feeding supplement (VITAL 1.5 CAL)  1,000 mL Per Tube Q24H  . free water  400 mL Per Tube Q4H  . heparin subcutaneous  5,000 Units Subcutaneous Q8H  . insulin aspart  0-15 Units Subcutaneous Q4H  . lactated ringers  1,000 mL Intravenous Once  . magnesium sulfate 1 - 4 g bolus IVPB  3 g  Intravenous Once  . methylPREDNISolone (SOLU-MEDROL) injection  40 mg Intravenous Daily  . metoprolol tartrate  12.5 mg Per Tube BID  . nystatin  5 mL Oral TID  . nystatin   Topical TID  . ranitidine  150 mg Per Tube BID  . sodium chloride  500 mL Intravenous Once  . sodium chloride flush  10-40 mL Intracatheter Q12H    Objective: Vital signs in last 24 hours: Temp:  [99.9 F (37.7 C)-100.4 F (38 C)] 100 F (37.8 C) (08/07 0400) Pulse Rate:  [95-107] 95 (08/07 0310) Resp:  [15-28] 28 (08/07 1300) BP: (67-160)/(37-107) 91/37 (08/07 1300) SpO2:  [91 %-99 %] 96 % (08/07 1300) FiO2 (%):  [40 %-50 %] 50 % (08/07 1125) Weight:  [146 lb 9.7 oz (66.5 kg)] 146 lb 9.7 oz (66.5 kg) (08/07 0556) Physical Exam  Constitutional: He is sedated. He appears chronically ill and well-nourished. No distress. Alert, able to answer yes/no questions HENT: trach in place. NG in place, decreased output Cardiovascular: Normal rate, regular rhythm and normal heart sounds. Exam reveals no gallop and no friction rub.  No murmur heard.  Pulmonary/Chest: Effort normal and breath sounds normal. No respiratory distress. He has no wheezes.  Abdominal: soft,mildly distended but still has few BS Skin: Skin is warm and dry. No rash noted. No erythema. Right ear pinna abrasion c/w pressure ulcer, no purulent drainage  Lab Results  Recent Labs  01/16/16 0418 01/17/16 0600  WBC 17.0* 16.9*  HGB 9.1* 8.7*  HCT 29.5* 28.1*  NA 152* 146*  K 4.2 3.9  CL 116* 113*  CO2 31 29  BUN 58* 56*  CREATININE 1.80* 1.57*   Liver Panel  Recent Labs  01/16/16 0418 01/17/16 0600  PROT 5.3* 5.1*  ALBUMIN 1.9*  2.0* 2.0*  1.9*  AST 219* 133*  ALT 241* 190*  ALKPHOS 257* 293*  BILITOT 1.1 1.4*  BILIDIR 0.5 0.5  IBILI 0.6 0.9   Microbiology: Trach Cx "resp flora" (unfortunately his BAL did not make it to lab) BCx 1/2 CNS (contaminant), UCx negative. Resp virus panel (-) ur leg (-) CXR improved from 1 week ago,  though still has right sided infiltrates.  Recent Results (from the past 240 hour(s))  Virus culture     Status: None   Collection Time: 01/11/16  5:48 PM  Result Value Ref Range Status   Viral Culture Comment  Final    Comment: (NOTE) Preliminary Report: No virus isolated at 4 days.  Next report to follow after 7 days. Performed At: [facility] [place], [place] [id number] Lindon Romp MD NW:[id number]    Source of Sample EAR  Final    Comment: RIGHT  Culture, blood (routine x 2)     Status: None (Preliminary result)   Collection Time: 01/15/16  6:46 PM  Result Value Ref Range Status   Specimen Description BLOOD LEFT HAND  Final   Special Requests BOTTLES DRAWN AEROBIC AND ANAEROBIC 5CC  Final   Culture   Final    NO GROWTH 1 DAY Performed at [facility]    Report Status PENDING  Incomplete  Culture, blood (routine x 2)     Status: None (Preliminary result)   Collection Time: 01/15/16  6:53 PM  Result Value Ref Range Status   Specimen Description BLOOD LEFT ARM  Final   Special Requests BOTTLES DRAWN AEROBIC AND ANAEROBIC 10CC  Final   Culture   Final    NO GROWTH 1 DAY Performed at [facility]    Report Status PENDING  Incomplete    Studies/Results: Ct Abdomen Pelvis Wo Contrast  Result Date: 01/16/2016 CLINICAL DATA:  Respiratory failure and hypoxemia. EXAM: CT ABDOMEN AND PELVIS WITHOUT CONTRAST TECHNIQUE: Multidetector CT imaging of the abdomen and pelvis was performed following the standard protocol without IV contrast. COMPARISON:  None. FINDINGS: Lower chest: Multifocal airspace opacities are identified within both lower lobes compatible with multifocal infection and/or aspiration. Aortic atherosclerosis is noted. Calcifications within the RCA, LAD and left circumflex coronary arteries noted. Hepatobiliary: There is no focal liver abnormality. Hyperdense material within the gallbladder is noted. Pancreas: No mass or  inflammatory process identified on this un-enhanced exam. Spleen: Within normal limits in size. Adrenals/Urinary Tract: The adrenal glands appear normal. Normal appearance of the right kidney. Stone within the left kidney measures 3 mm. There are 2 stones identified within the right posterior bladder measuring up to 4 mm. No hydroureter or hydronephrosis identified bilaterally. There is a Foley catheter noted within the urinary bladder. Stomach/Bowel: No pathologic dilatation of the small bowel loops. There is a a marked amount of stool identified within the diffusely distended colon. No pneumatosis or evidence of bowel perforation. Vascular/Lymphatic: Calcified atherosclerotic disease involves the abdominal aorta. No aneurysm. No enlarged retroperitoneal or mesenteric adenopathy. No enlarged pelvic or inguinal lymph nodes. Reproductive: No mass or other significant abnormality. Other: No pneumatosis. There is no ascites or focal fluid collections within the abdomen or pelvis. Musculoskeletal: Posterior rods and screws extend from the lower thoracic through the lumbar spine. IMPRESSION: 1. Diffuse gaseous distension of the colon which contains a moderate to marked stool burden. Chronic colonic findings are favored to represent constipation. Colonic ileus and or distal bowel obstruction is considered less favored but not excluded. 2. Bilateral lower lobe airspace opacities which may reflect pneumonia and or aspiration. 3. Aortic atherosclerosis and multi vessel coronary artery calcification. 4. Nephrolithiasis and bladder calculi. Electronically Signed   By: Kerby Moors M.D.   On: 01/16/2016 10:18   Dg Chest Port 1 View  Result Date: 01/15/2016 CLINICAL DATA:  Fever of unknown origin. History of COPD and pneumonia. EXAM: PORTABLE CHEST 1 VIEW COMPARISON:  Multiple recent prior chest radiographs, the most recent dated 01/14/2016. CT chest 12/24/2005 FINDINGS: Orogastric tube terminates in the proximal stomach,  in satisfactory position. Right IJ dialysis catheter projects over the expected location of the superior vena cava. Tracheostomy is in satisfactory position. Low lung volumes bilaterally. There are faint patchy opacities in the periphery of the left  mid lung and left lung base and at the right lung base. Overall, aeration of the lungs appears improved compared to 01/14/2016, particularly at the left lung base. Heart and mediastinal contours are stable. Harrington rods visualize lower thoracic spine. Bony demineralization noted. IMPRESSION: Improving aeration of the lungs compared to chest radiograph of 01/14/2016. There are some persistent mild patchy opacities bilateral. Satisfactory position of visualized support devices. Electronically Signed   By: Curlene Dolphin M.D.   On: 01/15/2016 18:26     Assessment/Plan: HCAP  = overall improving has been on  broad spectrum abtx and antifungals. Recommend to finish antifungal treatment tomorrow.  Respiratory distress, vent dependent = now with trach, getting weaned per PCCM  Ileus = continue with NG to help decompress. Still suffers constipation. Getting SMOG enema which appears to be improving.  Acute on CKD =Appears to be improving post ATN.  RA  = continue on solumedrol  Will sign off          Dasia Guerrier Infectious Diseases (pager) [phone] [email or web address] 01/17/2016, 3:40 PM  LOS: 14 days

## 2016-01-17 NOTE — Consult Note (Signed)
[facility] Nurse wound consult note Reason for Consult: Medical device related pressure injury to  Right ear from oxygen tubing, present on admission.  Moisture associated skin damage perineal/groin area.  Deep tissue injury to sacrum Wound type:Pressure and moisture Pressure Ulcer POA: Yes Measurement:Sacrum 2 cm x 2 cm intact maroon discoloration with scattered nonintact areas from exposure to incontinence.  0.5 cm darkened tissue to right ear from pressure of oxygen tubing.   Generalized denuded skin to groin from exposure to moisture.   Wound [id number] and moist Drainage (amount, consistency, odor) Scant serous  No odor Periwound:Erythema Dressing procedure/placement/frequency:Cleanse sacrum and ear with NS and pat gently dry.  APply silicone border foam dressing.  Change every 3 days and PRN soilage. Keep perineal skin clean and dry.  Turn and reposition every two hours. INterdry Ag to denuded skin: Measure and cut length of InterDry Ag+ to fit in skin folds that have skin breakdown Tuck InterDry  Ag+ fabric into skin folds in a single layer, allow for 2 inches of overhang from skin edges to allow for wicking to occur May remove to bathe; dry area thoroughly and then tuck into affected areas again Do not apply any creams or ointments when using InterDry Ag+ DO NOT THROW AWAY FOR 5 DAYS unless soiled with stool DO NOT [facility] product, this will inactivate the silver in the material  New sheet of Interdry Ag+ should be applied after 5 days of use if patient continues to have skin breakdown   Will not follow at this time.  Please re-consult if needed.  Domenic Moras RN BSN [place] Pager [phone]

## 2016-01-17 NOTE — Progress Notes (Signed)
PULMONARY / CRITICAL CARE MEDICINE   Name: Curtis Frazier MRN: [id number] DOB: 12/01/1940    ADMISSION DATE:  01/03/2016 CONSULTATION DATE:  01/03/2016  REFERRING MD:  Dr. Dayna Barker EDP  CHIEF COMPLAINT:  SOB/AMS  HISTORY:   75 y/o male presented with lethargy, hypoxia, hypotension from ARDS with concern for HCAP versus acute pneumonitis with hx of rheumatoid arthritis..  SUBJECTIVE:   C/o abd discomfort.   REVIEW OF SYSTEMS: Unable to assess given the ventilator and tracheostomy.  VITAL SIGNS: BP (!) 127/107   Pulse 95   Temp 100 F (37.8 C) (Core (Comment)) Comment (Src): temp foley  Resp (!) 22   Ht 5\' 8"  (1.727 m)   Wt 146 lb 9.7 oz (66.5 kg)   SpO2 94%   BMI 22.29 kg/m   HEMODYNAMICS:    VENTILATOR SETTINGS: Vent Mode: PRVC FiO2 (%):  [40 %] 40 % Set Rate:  [18 bmp] 18 bmp Vt Set:  [500 mL] 500 mL PEEP:  [5 cmH20] 5 cmH20 Plateau Pressure:  [9 cmH20-18 cmH20] 10 cmH20  INTAKE / OUTPUT: I/O last 3 completed shifts: In: 4317.5 [I.V.:1134.2; Other:480; NG/GT:2703.3] Out: 3600 [Urine:3600]  PHYSICAL EXAMINATION: General:  family at bedside. Restless at times, appears uncomfortable Neuro: awake, follows commands. Tries to communicate.  HEENT: Tracheostomy in place. Some dried emesis on CVL dressing  Cardiovascular: tachy rrr w/out MRG Pulmonary: clear w/ equal chest rise  Abdomen: Soft. Protuberant. Tender to palp, hypoactive bowel sounds  Integument: sacral wound dressing intact. Has barrier dressings on elbows and interdry Ag in skin folds.   LABS:  BMET  Recent Labs Lab 01/15/16 2214 01/16/16 0418 01/17/16 0600  NA 148* 152* 146*  K 4.2 4.2 3.9  CL 115* 116* 113*  CO2 29 31 29   BUN 64* 58* 56*  CREATININE 1.97* 1.80* 1.57*  GLUCOSE 300* 169* 96    Electrolytes  Recent Labs Lab 01/15/16 0500 01/15/16 2214 01/16/16 0418 01/17/16 0600  CALCIUM 8.8* 8.9 8.9 8.5*  MG 1.8 1.7 2.1 1.6*  PHOS 3.8  3.8  --  2.8 2.8    CBC  Recent Labs Lab  01/15/16 0500 01/16/16 0418 01/17/16 0600  WBC 17.4* 17.0* 16.9*  HGB 9.5* 9.1* 8.7*  HCT 29.6* 29.5* 28.1*  PLT 140* 125* 108*    Coag's  Recent Labs Lab 01/15/16 0500 01/16/16 0418 01/17/16 0600  APTT 36 33 31  INR 1.43 1.39 1.29    Sepsis Markers No results for input(s): LATICACIDVEN, PROCALCITON, O2SATVEN in the last 168 hours.  ABG No results for input(s): PHART, PCO2ART, PO2ART in the last 168 hours.  Liver Enzymes  Recent Labs Lab 01/15/16 0500 01/16/16 0418 01/17/16 0600  AST  --  219* 133*  ALT  --  241* 190*  ALKPHOS  --  257* 293*  BILITOT  --  1.1 1.4*  ALBUMIN 2.2* 1.9*  2.0* 2.0*  1.9*    Cardiac Enzymes No results for input(s): TROPONINI, PROBNP in the last 168 hours.  Glucose  Recent Labs Lab 01/16/16 1145 01/16/16 1608 01/16/16 1916 01/16/16 2349 01/17/16 0331 01/17/16 0803  GLUCAP 164* 233* 218* 194* 131* 96    Imaging Ct Abdomen Pelvis Wo Contrast  Result Date: 01/16/2016 CLINICAL DATA:  Respiratory failure and hypoxemia. EXAM: CT ABDOMEN AND PELVIS WITHOUT CONTRAST TECHNIQUE: Multidetector CT imaging of the abdomen and pelvis was performed following the standard protocol without IV contrast. COMPARISON:  None. FINDINGS: Lower chest: Multifocal airspace opacities are identified within both lower lobes compatible  with multifocal infection and/or aspiration. Aortic atherosclerosis is noted. Calcifications within the RCA, LAD and left circumflex coronary arteries noted. Hepatobiliary: There is no focal liver abnormality. Hyperdense material within the gallbladder is noted. Pancreas: No mass or inflammatory process identified on this un-enhanced exam. Spleen: Within normal limits in size. Adrenals/Urinary Tract: The adrenal glands appear normal. Normal appearance of the right kidney. Stone within the left kidney measures 3 mm. There are 2 stones identified within the right posterior bladder measuring up to 4 mm. No hydroureter or  hydronephrosis identified bilaterally. There is a Foley catheter noted within the urinary bladder. Stomach/Bowel: No pathologic dilatation of the small bowel loops. There is a a marked amount of stool identified within the diffusely distended colon. No pneumatosis or evidence of bowel perforation. Vascular/Lymphatic: Calcified atherosclerotic disease involves the abdominal aorta. No aneurysm. No enlarged retroperitoneal or mesenteric adenopathy. No enlarged pelvic or inguinal lymph nodes. Reproductive: No mass or other significant abnormality. Other: No pneumatosis. There is no ascites or focal fluid collections within the abdomen or pelvis. Musculoskeletal: Posterior rods and screws extend from the lower thoracic through the lumbar spine. IMPRESSION: 1. Diffuse gaseous distension of the colon which contains a moderate to marked stool burden. Chronic colonic findings are favored to represent constipation. Colonic ileus and or distal bowel obstruction is considered less favored but not excluded. 2. Bilateral lower lobe airspace opacities which may reflect pneumonia and or aspiration. 3. Aortic atherosclerosis and multi vessel coronary artery calcification. 4. Nephrolithiasis and bladder calculi. Electronically Signed   By: Kerby Moors M.D.   On: 01/16/2016 10:18     STUDIES:  CTA Chest 7/25:  b/l GGO and interlobular septal thickening, bibasilar consolidation, mediastinal LAN TTE 7/26: mild LVH, EF 55 to 123456, grade 1 diastolic CHF, mod AS, mod TR, severe LA dilation, PAS 40 mmHg  Port CXR 8/4:  Tracheostomy & R IJ CVL in good position. Enteric tube below diaphragm. Port CXR 8/5: Low lung volumes with slightly lordotic view. Improving bilateral opacities with decreased silhouetting left hemidiaphragm. The central venous catheter, enteric tube, and tracheostomy in good position. No new opacity appreciated. CT Abd/Plevis 8/6: diffuse gaseous pattern w/ mild to marked stool burden c/w constipation. Bibasilar  airspace disease   MICROBIOLOGY: Respiratory Viral Panel PCR 7/15:  Negative  Blood Ctx x4 7/24: 1/4 Coag Neg Staph Tracheal Asp Ctx 7/24:  Oral Flora Respiratory Viral Panel PCR 7/24:  Negative  Tracheal Asp Ctx 7/27: Normal respiratory flora Blood Ctx 7/27:  Negative  Ear Viral Ctx 8/1 >> Blood Ctx x2 8/5 >>  ANTIBIOTICS: Aztreonam 7/24 - 7/25 Imipenem 7/24 - 7/25 Vancomycin 7/24 - 7/25 Levaquin 7/25 - 8/1 Linezolid 7/25 - 8/4 Meropenem 7/25 - 8/5 Anidulofungin 7/27 >> 8/7  SIGNIFICANT EVENTS: 7/22 - discharge to SNF 7/24 - Admit, intubated for respiratory failure  7/25 - worsening acidosis, off pressors, ARDS protocol, CVVHD initiated 7/26 - Nimbex discontinued  7/30 - hypotension with sedation, vasopressors restarted, ST segment changes  7/31 - vomiting overnight, NGT to suction 8/02 - off pressors 8/03 - trach 8/04 - SVT vs Sinus Tach w/ PVCs along w/ new fever 8/7 abd pain; CT showing constipation. Repeating SMOG  LINES/TUBES: ETT 8.5 7/24 - 8/3 #8 trach 8/3(DF)>> L Subclavian CVL 7/25 >> 8/3 pulled out by pt. R IJ HD CVL 7/25 >> 8/7  ASSESSMENT / PLAN:  PULMONARY A: Acute Hypoxic Respiratory Failure - ARDS w/ HCAP. 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 H/O OSA.  P:   Continue Tracheostomy Collar q4hr on with Full Vent Support for rest for 4 hours Continue Solu-Medrol 40mg  IV daily; we can likely cut this back soon Duoneb q6hr-->will change to brovana/budesonide. Trying to limit anticholinergics w/ severe constipation  Intermittent CXR  CARDIOVASCULAR A:  Abnormal EKG >> ST elevation RCA distribution, resolved spontaneously without hemodynamic change, uncertain etiology. Seen on 7/28 & again 7/30. Suspect demand with hypotension in setting of moderate aortic stenosis. Shock - Likely sedation & CVVHD related. Resolved. H/O CAD, Carotid artery disease, chronic diastolic CHF, HTN, a fib s/p  ablation, HLD, mod AS.  P:  Monitoring on telemetry Vitals per unit protocol  Hold ASA with concern about gastritis Holding heparin gtt  If further rhythm problems plan for Lopressor  RENAL A:   Acute on Chronic Renal Failure Stage 3 - Off CVVHD 8/3. Resolving. Hypomagnesemia - Replacing. Hypernatremia -> improving w/ free water replacement Hyperchloremia - >improved w/ free water replacement  P:   Nephrology following, appreciate assistance  Trending UOP Monitoring electrolytes & renal function daily Dc HD line Holding Free Water to 400cc VT q4hr Cont 1/2 ns at 75  GASTROINTESTINAL A:   Abdominal Pain w/ associated Ileus vs Constipation - Improved after SMOG enema 8/4.  Still w/ sig residual constipation 8/7.  Got one dose of Relistor.  Gastritis.  P:   NPO Holding Tube Feedings-->resume after he has BM Pepcid IV q24hr Senna bid VT If no results by this afternoon will repeat SMOG enema    HEMATOLOGIC A:   Coagulopathy - Secondary to Coumadin. Resolved. Thrombocytopenia - Mild. Stable/improved Anemia - Likely chronic illness. Hgb stable. No signs of active bleeding.  P:  Trending cell counts & INR daily Heparin Stickney for DVT prophylaxis  Heparin gtt held  SCDs Transfuse for Hgb <8.0 or signs of active bleeding  INFECTIOUS A:   HCAP. Staph in blood culture - likely contaminate. Candidal scrotal rash. Pressure ulcer Rt ear FUO - UA unimpressive & Blood Cultures obtained  P:   Abx per ID will stop antifungal today (8/7) per ID recs  ENDOCRINE A:   DM type II - Glucose controlled.  P:   Accu-Checks q4hr SSI per Moderate Algorithm  NEUROLOGIC A:   Acute Encephalopathy - Multifactorial from hypoxia and metabolic. Improving.  P:   RASS goal:  0 Limiting sedating medications Versed IV prn Fentanyl IV prn  DERM A: Sacral pressure ulcer Right ear lesion-->viral culture negative; likely pressure ulcer.  Denuded skin groin and skin folds.    P: Wound care per WOC recs: sacral dressing change q 3, Interdry Ag to skin folds q5d  FAMILY UPDATE: Wife, son, and other family members updated on 8/7 by Dr. Ashok Cordia at bedside.  TODAY'S SUMMARY:  75 year old male with baseline RA and COPD and a recent admission for acute respiratory failure with hypoxemia was admitted on 7/24 with ARDS most likely due to HCAP but there has been question of underlying ILD related to his rheumatoid arthritis.  Has multi-organ failure.  Renal function is improving. It seems as though he is past the acute illness and is soon to enter into his rehab phase. I think his major obstacle for today is to get his bowels working. Although we will continue weaning and strengthening efforts while we address this I suspect this barrier will need to be overcome before he progresses much further. Have ordered SMOG enema.   Erick Colace ACNP-BC [place]  [facility] Pager # [phone] OR # [phone] if no answer  PCCM Attending Note: Patient seen and examined with nurse practitioner. Please refer to his note which I have reviewed in detail. Patient continuing to have intermittent supraventricular tachycardia. Constipation producing abdominal pain is a significant problem for the patient at this time as he is unable to tolerate tube feeds and enteral nutrition. Attempting repeat enema resolved constipation. Patient's blood pressure is also slightly borderline with his diuresis from recovering renal failure therefore administering further IV fluids for resuscitation. Replacing patient's serum magnesium IV. With patient's ongoing rhythm problems holding on tracheostomy collar today and continuing with pressure support wean. Family updated at bedside. Hopefully we'll be able to transition to inpatient rehabilitation facility this week.  I have spent a total of 37 minutes of critical care time today caring for the patient, updating family at bedside, and reviewing the  patient's electronic medical record.  Sonia Baller Ashok Cordia, M.D. [facility] Pulmonary & Critical Care Pager:  [phone] After 3pm or if no response, call [phone] 2:07 PM 01/17/16

## 2016-01-17 NOTE — Progress Notes (Signed)
Nutrition Follow-up  DOCUMENTATION CODES:   Non-severe (moderate) malnutrition in context of acute illness/injury  INTERVENTION:  - Continue order for goal rate for TF: Vital 1.5 @ 50 mL/hr with 30 mL Prostat once/day. This regimen will provide 1900 kcal, 96 grams of protein, and 917 mL free water.  - Continue free water flush per MD/NP order.  - Will order PEPUP protocol once TF is able to reach goal rate.  - RD will follow-up 8/8.   NUTRITION DIAGNOSIS:   Inadequate oral intake related to inability to eat as evidenced by NPO status. -ongoing  GOAL:   Patient will meet greater than or equal to 90% of their needs -unmet with TF currently on hold.  MONITOR:   Vent status, TF tolerance, Weight trends, Labs, Skin, I & O's  ASSESSMENT:   75 year old male with PMH as below which is significant for Diastolic CHF, CKD, COPD, PAF (s/p recent ablation), and rheumatoid arthritis (recently on remicade, now on chronic pred 5mg ). He has several recent hospitalizations. Most recently he was admitted 7/15 for presumed HCAP. He was streated with 9 days of HCAP antibiotics and was transferred to [facility] 7/22. 7/24 he was noted to have AMS to the point where he was essentially comatose. O2 sats noted to be about 60% on room air at that time. He was transported to [facility] ED for further evaluation where he was placed on BiPAP with some improvement. CXR was performed which is consistent with interstitial lung changes noted on very recent CT.  8/7 Pt with NGT in place. TF off this AM; RN in the room reports TF to remain off until pt has a BM and then to restart at trickle rate. FLEET enema was given 8/6. CCM NP note this AM states Senna BID and if no BM by 1200, to receive SMOG enema. Pt continues on CRRT holiday. Nephrology note this AM states plan for removal of HD cath and no further renal interventions warranted at this time.   Pt continues with cycle of 4 hours on trach collar followed by 4 hours on full  vent. Patient is on ventilator support at time of RD visit MV: 16 L/min Temp (24hrs), Avg:100.1 F (37.8 C), Min:99.9 F (37.7 C), Max:100.4 F (38 C) Propofol: none  Estimated nutrition needs updated this AM; will provide goal rate recommendations above although pt unlikely to be able to advance to goal today. Free water flush ordered per MD: 400 mL every 4 hours (2400 mL). RD will follow-up 8/8.  Medications reviewed; sliding scale Novolog, 40 mg IV Solu-medrol/day, PRN Zofran. Labs reviewed; CBGs: 96 and 131 mg/dL this AM, Na: 146 mmol/L, Cl: 113 mmol/L, BUN: 56 mg/dL, creatinine: 1.57 mg/dL, Ca: 8.5 mg/dL, Mg: 1.6 mg/dL, LFTs elevated, GFR: 41 mL/min.  IVF: 1/2 NS @ 75 mL/hr.   8/6 - Pt continues Trach Collar q4hr on with Full Vent Support for rest for 4 hours.  - Pt continuing CRRT holiday. - Per pt's daughter in room, pt had a CT scan this morning d/t continued abdominal pain.  - Tube feeding was turned off for this procedure. - Plan is to hold tube feedings per MD.  - Pt was tolerating Vital 1.5 @ goal of 50 ml/hr with 30 ml Prostat prior to hold.  Patient is currently intubated on ventilator support MV: 11.1 L/min Temp (24hrs), Avg:100.8 F (38.2 C), Max:101.5 F (38.6 C)   8/4 - Pt was on CRRT 7/25-8/2 and continues on CRRT break today.  -  NGT was replaced following trach placement 8/3 and is to LIS with ~100cc drainage since yesterday at 1800.  - Spoke with CCM NP who reports pt continues without BM and that repeat KUB done yesterday continues to show stool in GI tract and plan for enema today.  - Discussed TF with plan to re-start TF at trickle rate today. - Weight down 2.2 kg since admission for pt previously on CRRT.  - At time of visit, BP: 98/48 and MAP: 59.  - Notes indicate pt with UGIB d/t gastritis.   Patient is currently intubated on ventilator support MV: 9.8 L/min Temp (24hrs), Avg:99.8 F (37.7 C), Max:100.8 F (38.2 C)  IVF: D5-NS @ 40 mL/hr  (163 kcal).    Diet Order:   NPO  Skin:  Wound (see comment) (L ear bruising), new DTI to sacrum  Last BM:  8/4  Height:   Ht Readings from Last 1 Encounters:  01/05/16 5\' 8"  (1.727 m)    Weight:   Wt Readings from Last 1 Encounters:  01/17/16 146 lb 9.7 oz (66.5 kg)    Ideal Body Weight:  70 kg (kg)  BMI:  Body mass index is 22.29 kg/m.  Estimated Nutritional Needs:   Kcal:  1950  Protein:  80-100 grams (1.2-1.5 grams/kg)  Fluid:  1.8-2 L/day  EDUCATION NEEDS:   No education needs identified at this time    Jarome Matin, MS, RD, LDN Inpatient Clinical Dietitian Pager # [phone] After hours/weekend pager # [phone]

## 2016-01-17 NOTE — Progress Notes (Signed)
[facility] Progress Note   Subjective: 2 L UOP, Na down 146   Vitals:   01/17/16 0556 01/17/16 0600 01/17/16 0605 01/17/16 0757  BP:  (!) 67/46 (!) 127/107   Pulse:      Resp:  19 (!) 22   Temp:      TempSrc:      SpO2:  97% 96% 94%  Weight: 66.5 kg (146 lb 9.7 oz)     Height:        Inpatient medications: . antiseptic oral rinse  7 mL Mouth Rinse QID  . chlorhexidine gluconate (SAGE KIT)  15 mL Mouth Rinse BID  . feeding supplement (PRO-STAT SUGAR FREE 64)  30 mL Per Tube Daily  . feeding supplement (VITAL 1.5 CAL)  1,000 mL Per Tube Q24H  . free water  400 mL Per Tube Q4H  . heparin subcutaneous  5,000 Units Subcutaneous Q8H  . insulin aspart  0-15 Units Subcutaneous Q4H  . ipratropium-albuterol  3 mL Nebulization Q6H  . methylPREDNISolone (SOLU-MEDROL) injection  40 mg Intravenous Daily  . nystatin  5 mL Oral TID  . nystatin   Topical TID  . ranitidine  150 mg Per Tube BID  . sodium chloride  500 mL Intravenous Once  . sodium chloride flush  10-40 mL Intracatheter Q12H   . sodium chloride 50 mL/hr at 01/17/16 0600   sodium chloride, acetaminophen (TYLENOL) oral liquid 160 mg/5 mL, albuterol, alteplase, fentaNYL (SUBLIMAZE) injection, ipratropium, midazolam, ondansetron (ZOFRAN) IV, sodium chloride flush  Exam: Gen on vent, eyes open, no commands  Sclera anicteric, throat w ETT-  Right IJ vascath placed 7/25 No jvd or bruits Chest some post crackles at bases RRR no MRG Abd soft nondistended no ascites or mass GU normal male w Foley Ext no LE or UE edema Neuro as above  UA - negative  Assessment: 1  Acute on CRF3 - baseline creatinine in the high 1's- ischemic ATN due to shock most likely, CRRT from 7/25 to 8/2.  Recovered renal function, no further suggestions.  DC temp HD cath. Will sign off.  2  Recurrent resp failure - ARDS + prob HCAP- per CCM and ID- making some positive movements but now s/p trach on 8/3 3  Volume - CVP variable, stable  wts, no vol excess- was running even-  Now with  sodium climbing- I see that free water volume has been increased and also started on 1/2 NS- I agree  4  COPD underlying 5  Hx RA/ psor arthriitis/ PMR - chron immunosuppressed for yrs (methotrexate, Remicade/ Humira, pred) 6. Acidosis- improved 7. Hypernatremia - improving w 1/2 NS, ordered to cont for another 24-28 hrs    Felicha Frayne D  01/17/2016, 9:04 AM    Recent Labs Lab 01/15/16 0500 01/15/16 2214 01/16/16 0418 01/17/16 0600  NA 146* 148* 152* 146*  K 4.5 4.2 4.2 3.9  CL 112* 115* 116* 113*  CO2 29 29 31 29   GLUCOSE 190* 300* 169* 96  BUN 60* 64* 58* 56*  CREATININE 2.12* 1.97* 1.80* 1.57*  CALCIUM 8.8* 8.9 8.9 8.5*  PHOS 3.8  3.8  --  2.8 2.8    Recent Labs Lab 01/15/16 0500 01/16/16 0418 01/17/16 0600  AST  --  219* 133*  ALT  --  241* 190*  ALKPHOS  --  257* 293*  BILITOT  --  1.1 1.4*  PROT  --  5.3* 5.1*  ALBUMIN 2.2* 1.9*  2.0* 2.0*  1.9*  Recent Labs Lab 01/15/16 0500 01/16/16 0418 01/17/16 0600  WBC 17.4* 17.0* 16.9*  NEUTROABS 14.8* 13.6* 13.7*  HGB 9.5* 9.1* 8.7*  HCT 29.6* 29.5* 28.1*  MCV 94.0 94.9 95.6  PLT 140* 125* 108*   Iron/TIBC/Ferritin/ %Sat    Component Value Date/Time   IRON 24 (L) 08/13/2015 0430   TIBC 258 08/13/2015 0430   FERRITIN 147 08/13/2015 0430   IRONPCTSAT 9 (L) 08/13/2015 0430

## 2016-01-18 ENCOUNTER — Ambulatory Visit: Payer: Medicare Other | Admitting: Sports Medicine

## 2016-01-18 ENCOUNTER — Encounter: Payer: Self-pay | Admitting: *Deleted

## 2016-01-18 LAB — CBC WITH DIFFERENTIAL/PLATELET
BASOS ABS: 0 10*3/uL (ref 0.0–0.1)
Basophils Relative: 0 %
Eosinophils Absolute: 0 10*3/uL (ref 0.0–0.7)
Eosinophils Relative: 0 %
HCT: 30.2 % — ABNORMAL LOW (ref 39.0–52.0)
HEMOGLOBIN: 9.4 g/dL — AB (ref 13.0–17.0)
LYMPHS ABS: 1.2 10*3/uL (ref 0.7–4.0)
LYMPHS PCT: 6 %
MCH: 29.6 pg (ref 26.0–34.0)
MCHC: 31.1 g/dL (ref 30.0–36.0)
MCV: 95 fL (ref 78.0–100.0)
Monocytes Absolute: 0.9 10*3/uL (ref 0.1–1.0)
Monocytes Relative: 5 %
NEUTROS ABS: 16.9 10*3/uL — AB (ref 1.7–7.7)
NEUTROS PCT: 89 %
PLATELETS: 123 10*3/uL — AB (ref 150–400)
RBC: 3.18 MIL/uL — AB (ref 4.22–5.81)
RDW: 18.9 % — ABNORMAL HIGH (ref 11.5–15.5)
WBC: 19 10*3/uL — AB (ref 4.0–10.5)

## 2016-01-18 LAB — RENAL FUNCTION PANEL
ANION GAP: 6 (ref 5–15)
Albumin: 1.8 g/dL — ABNORMAL LOW (ref 3.5–5.0)
BUN: 60 mg/dL — ABNORMAL HIGH (ref 6–20)
CHLORIDE: 115 mmol/L — AB (ref 101–111)
CO2: 28 mmol/L (ref 22–32)
Calcium: 8.5 mg/dL — ABNORMAL LOW (ref 8.9–10.3)
Creatinine, Ser: 1.64 mg/dL — ABNORMAL HIGH (ref 0.61–1.24)
GFR, EST AFRICAN AMERICAN: 45 mL/min — AB (ref >60)
GFR, EST NON AFRICAN AMERICAN: 39 mL/min — AB (ref >60)
Glucose, Bld: 147 mg/dL — ABNORMAL HIGH (ref 65–99)
POTASSIUM: 4.2 mmol/L (ref 3.5–5.1)
Phosphorus: 3.2 mg/dL (ref 2.5–4.6)
Sodium: 149 mmol/L — ABNORMAL HIGH (ref 135–145)

## 2016-01-18 LAB — GLUCOSE, CAPILLARY
GLUCOSE-CAPILLARY: 145 mg/dL — AB (ref 65–99)
GLUCOSE-CAPILLARY: 127 mg/dL — AB (ref 65–99)
GLUCOSE-CAPILLARY: 206 mg/dL — AB (ref 65–99)
Glucose-Capillary: 154 mg/dL — ABNORMAL HIGH (ref 65–99)
Glucose-Capillary: 171 mg/dL — ABNORMAL HIGH (ref 65–99)

## 2016-01-18 LAB — MAGNESIUM: MAGNESIUM: 1.9 mg/dL (ref 1.7–2.4)

## 2016-01-18 MED ORDER — FREE WATER
200.0000 mL | Freq: Four times a day (QID) | Status: DC
Start: 2016-01-18 — End: 2016-01-19
  Administered 2016-01-18 – 2016-01-19 (×3): 200 mL

## 2016-01-18 MED ORDER — ANTISEPTIC ORAL RINSE SOLUTION (CORINZ)
7.0000 mL | Freq: Four times a day (QID) | OROMUCOSAL | Status: DC
  Administered 2016-01-19 (×3): 7 mL via OROMUCOSAL

## 2016-01-18 MED ORDER — CHLORHEXIDINE GLUCONATE 0.12% ORAL RINSE (MEDLINE KIT)
15.0000 mL | Freq: Two times a day (BID) | OROMUCOSAL | Status: DC
  Administered 2016-01-19: 15 mL via OROMUCOSAL

## 2016-01-18 NOTE — Progress Notes (Signed)
Nutrition Follow-up  DOCUMENTATION CODES:   Non-severe (moderate) malnutrition in context of acute illness/injury  INTERVENTION:  - Will d/c Prostat and continue order for goal rate TF: Vital 1.5 @ 50 mL/hr which will provide 1800 kcal, 81 grams of protein, and 917 mL free water.  - Continue free water flush per MD/NP order.  - RD will follow-up 8/9.  NUTRITION DIAGNOSIS:   Inadequate oral intake related to inability to eat as evidenced by NPO status. -ongoing  GOAL:   Patient will meet greater than or equal to 90% of their needs -unmet with trickle TF rate.  MONITOR:   TF tolerance, Weight trends, Labs, Skin, I & O's  ASSESSMENT:   75 year old male with PMH as below which is significant for Diastolic CHF, CKD, COPD, PAF (s/p recent ablation), and rheumatoid arthritis (recently on remicade, now on chronic pred 5mg ). He has several recent hospitalizations. Most recently he was admitted 7/15 for presumed HCAP. He was streated with 9 days of HCAP antibiotics and was transferred to [facility] 7/22. 7/24 he was noted to have AMS to the point where he was essentially comatose. O2 sats noted to be about 60% on room air at that time. He was transported to [facility] ED for further evaluation where he was placed on BiPAP with some improvement. CXR was performed which is consistent with interstitial lung changes noted on very recent CT.  8/8 Pt continues with NGT. SMOG enema given yesterday afternoon with good results. TF restarted at trickle rate which continues this AM: Vital 1.5 @ 15 mL/hr which is providing 540 kcal, 24 grams of protein, and 275 mL free water. Estimated nutrition needs updated this AM as pt is currently on trach collar. Range should be appropriate for current respiratory regimen cycle of 4 hours of trach collar with 4 hours of full vent support.  Will monitor for possible SLP evaluation for Passy prior to d/c; pt attempted to communicate with RD this AM but was very difficult to understand  d/t no Passy and dry mouth and sticky saliva.   Goal rate for TF outlined above; will monitor for ability to advance TF beyond trickle rate now that BM achieved. Free water flush continues per MD. RD will follow-up tomorrow. Pt to be switched to Osmolite formula prior to d/c to facility.   Medications reviewed; sliding scale Novolgo, 3 g IV Mg sulfate x1 dose yesterday, 40 mg IV Solu-medrol/day, PRN Zofran.  Labs reviewed; CBGs: 127 and 145 mg/dL this AM, Na: 149 mmol/L, Cl: 115 mmol/l, BUN: 60 mg/dL, creatinine: 1.64 mg/dL, Ca: 8.5 mg/dL, GFR: 39 mL/min, LFTs elevated.  IVF: 1/2 NS @ 75 mL/hr.     8/7 - TF off this AM; RN in the room reports TF to remain off until pt has a BM and then to restart at trickle rate.  - FLEET enema was given 8/6. - CCM NP note this AM states Senna BID and if no BM by 1200, to receive SMOG enema.  - Pt continues on CRRT holiday.  - Nephrology note this AM states plan for removal of HD cath and no further renal interventions warranted at this time.  - Estimated nutrition needs updated this AM. - Free water flush ordered per MD: 400 mL every 4 hours (2400 mL). RD will follow-up 8/8.   Pt continues with cycle of 4 hours on trach collar followed by 4 hours on full vent. Patient is on ventilator support at time of RD visit MV: 16 L/min  Temp (24hrs), Avg:100.1 F (37.8 C), Min:99.9 F (37.7 C), Max:100.4 F (38 C) Propofol: none  IVF: 1/2 NS @ 75 mL/hr.   8/6 - Pt continues TrachCollar q4hr on with Full Vent Support for rest for 4 hours.  - Pt continuing CRRT holiday. - Per pt's daughter in room, pt had a CT scan this morning d/t continued abdominal pain.  - Tube feeding was turned off for this procedure. - Plan is to hold tube feedings per MD.  - Pt was tolerating Vital 1.5 @ goal of 50 ml/hr with 30 ml Prostat prior to hold.  Patient is currently intubated on ventilator support MV: 11.1L/min Temp (24hrs), Avg:100.8 F (38.2 C), Max:101.5 F  (38.6 C)   Diet Order:   NPO  Skin:  Wound (see comment) (L ear bruising)  Last BM:  8/7  Height:   Ht Readings from Last 1 Encounters:  01/05/16 5\' 8"  (1.727 m)    Weight:   Wt Readings from Last 1 Encounters:  01/18/16 145 lb 1 oz (65.8 kg)    Ideal Body Weight:  70 kg (kg)  BMI:  Body mass index is 22.06 kg/m.  Estimated Nutritional Needs:   Kcal:  1775-1975 (27-30 kcal/kg)  Protein:  79-92 grams (1.2-1.4 grams/kg)  Fluid:  1.8-2 L/day  EDUCATION NEEDS:   No education needs identified at this time    Jarome Matin, MS, RD, LDN Inpatient Clinical Dietitian Pager # [phone] After hours/weekend pager # [phone]

## 2016-01-18 NOTE — Progress Notes (Signed)
RT note- routine ventilator change.

## 2016-01-18 NOTE — Care Management Note (Signed)
Case Management Note  Patient Details  Name: Curtis Frazier MRN: [id number] Date of Birth: 1941/02/05  Subjective/Objective:  Full vent support. Per NP Pete-agreed to [facility] to follow-per family Ruby Cola they will still explore LTACH but had gone to [facility] in past & had a bad experience,but willing to talk to family about [facility]. They are not interested in [facility].CSW will follow @ a distance.[facility] rep Doroteo Bradford following.Discussed in [facility] today.                  Action/Plan:d/c plan LTACH.   Expected Discharge Date:   (unknown)               Expected Discharge Plan:  Long Term Acute Care (LTAC)  In-House Referral:     Discharge planning Services  CM Consult  Post Acute Care Choice:    Choice offered to:     DME Arranged:    DME Agency:     HH Arranged:    HH Agency:     Status of Service:  In process, will continue to follow  If discussed at Long Length of Stay Meetings, dates discussed:    Additional Comments:  Dessa Phi, RN 01/18/2016, 10:41 AM

## 2016-01-18 NOTE — Progress Notes (Signed)
PULMONARY / CRITICAL CARE MEDICINE   Name: Curtis Frazier MRN: [id number] DOB: 1940/07/30    ADMISSION DATE:  01/03/2016 CONSULTATION DATE:  01/03/2016  REFERRING MD:  Dr. Dayna Barker EDP  CHIEF COMPLAINT:  SOB/AMS  HISTORY:   75 y/o male presented with lethargy, hypoxia, hypotension from ARDS with concern for HCAP versus acute pneumonitis with hx of rheumatoid arthritis..  SUBJECTIVE:  Patient had profound bowel movement after SMOG enema yesterday. No acute events overnight. Restarted on trickle tube feedings.  REVIEW OF SYSTEMS: Unable to assess given the ventilator and tracheostomy.  VITAL SIGNS: BP (!) 97/52   Pulse 100   Temp 97.4 F (36.3 C) (Oral)   Resp 16   Ht 5\' 8"  (1.727 m)   Wt 145 lb 1 oz (65.8 kg)   SpO2 97%   BMI 22.06 kg/m   HEMODYNAMICS:    VENTILATOR SETTINGS: Vent Mode: PRVC FiO2 (%):  [40 %-50 %] 40 % Set Rate:  [18 bmp] 18 bmp Vt Set:  [500 mL] 500 mL PEEP:  [5 cmH20] 5 cmH20 Plateau Pressure:  [5 cmH20-11 cmH20] 5 cmH20  INTAKE / OUTPUT: I/O last 3 completed shifts: In: 3125 [I.V.:2273.8; NG/GT:851.3] Out: 2500 [Urine:2500]  PHYSICAL EXAMINATION: General:  No family at bedside. No distress. Awake. Neuro: Following commands. Moving all 4 extremities equally. Nods to questions.  HEENT: Tracheostomy in place. No scleral icterus. Cardiovascular: Regular rate & rhythm. No edema. Pulmonary: Clear bilaterally with auscultation. Symmetric chest rise on ventilator. Abdomen: Soft. Protuberant. Hypoactive bowel sounds. Integument: sacral wound dressing intact. Has barrier dressings on elbows and interdry Ag in skin folds.   LABS:  BMET  Recent Labs Lab 01/16/16 0418 01/17/16 0600 01/18/16 0418  NA 152* 146* 149*  K 4.2 3.9 4.2  CL 116* 113* 115*  CO2 31 29 28   BUN 58* 56* 60*  CREATININE 1.80* 1.57* 1.64*  GLUCOSE 169* 96 147*    Electrolytes  Recent Labs Lab 01/16/16 0418 01/17/16 0600 01/18/16 0418  CALCIUM 8.9 8.5* 8.5*  MG 2.1  1.6* 1.9  PHOS 2.8 2.8 3.2    CBC  Recent Labs Lab 01/16/16 0418 01/17/16 0600 01/18/16 0418  WBC 17.0* 16.9* 19.0*  HGB 9.1* 8.7* 9.4*  HCT 29.5* 28.1* 30.2*  PLT 125* 108* 123*    Coag's  Recent Labs Lab 01/15/16 0500 01/16/16 0418 01/17/16 0600  APTT 36 33 31  INR 1.43 1.39 1.29    Sepsis Markers No results for input(s): LATICACIDVEN, PROCALCITON, O2SATVEN in the last 168 hours.  ABG No results for input(s): PHART, PCO2ART, PO2ART in the last 168 hours.  Liver Enzymes  Recent Labs Lab 01/16/16 0418 01/17/16 0600 01/18/16 0418  AST 219* 133*  --   ALT 241* 190*  --   ALKPHOS 257* 293*  --   BILITOT 1.1 1.4*  --   ALBUMIN 1.9*  2.0* 2.0*  1.9* 1.8*    Cardiac Enzymes No results for input(s): TROPONINI, PROBNP in the last 168 hours.  Glucose  Recent Labs Lab 01/17/16 0803 01/17/16 1125 01/17/16 1613 01/17/16 2001 01/17/16 2329 01/18/16 0340  GLUCAP 96 166* 182* 206* 175* 145*    Imaging No results found.   STUDIES:  CTA Chest 7/25:  b/l GGO and interlobular septal thickening, bibasilar consolidation, mediastinal LAN TTE 7/26: mild LVH, EF 55 to 123456, grade 1 diastolic CHF, mod AS, mod TR, severe LA dilation, PAS 40 mmHg  Port CXR 8/4:  Tracheostomy & R IJ CVL in good position. Enteric tube  below diaphragm. Port CXR 8/5: Low lung volumes with slightly lordotic view. Improving bilateral opacities with decreased silhouetting left hemidiaphragm. The central venous catheter, enteric tube, and tracheostomy in good position. No new opacity appreciated. CT Abd/Plevis 8/6: diffuse gaseous pattern w/ mild to marked stool burden c/w constipation. Bibasilar airspace disease   MICROBIOLOGY: Respiratory Viral Panel PCR 7/15:  Negative  Blood Ctx x4 7/24: 1/4 Coag Neg Staph Tracheal Asp Ctx 7/24:  Oral Flora Respiratory Viral Panel PCR 7/24:  Negative  Tracheal Asp Ctx 7/27: Normal respiratory flora Blood Ctx 7/27:  Negative  Ear Viral Ctx 8/1  >> Blood Ctx x2 8/5 >>  ANTIBIOTICS: Aztreonam 7/24 - 7/25 Imipenem 7/24 - 7/25 Vancomycin 7/24 - 7/25 Levaquin 7/25 - 8/1 Linezolid 7/25 - 8/4 Meropenem 7/25 - 8/5 Anidulofungin 7/27 - 8/7  SIGNIFICANT EVENTS: 7/22 - discharge to SNF 7/24 - Admit, intubated for respiratory failure  7/25 - worsening acidosis, off pressors, ARDS protocol, CVVHD initiated 7/26 - Nimbex discontinued  7/30 - hypotension with sedation, vasopressors restarted, ST segment changes  7/31 - vomiting overnight, NGT to suction 8/02 - off pressors 8/03 - trach 8/04 - SVT vs Sinus Tach w/ PVCs along w/ new fever 8/07 - abd pain; CT showing constipation. Repeating SMOG  LINES/TUBES: ETT 8.5 7/24 - 8/3 L Subclavian CVL 7/25 - 8/3 pulled out by pt. R IJ HD CVL 7/25 - 8/7 Trach #8 8/3 (DF)>> R Nare NGT 8/3>> PIV x1  ASSESSMENT / PLAN:  PULMONARY A: Acute Hypoxic Respiratory Failure - ARDS w/ HCAP. 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 H/O OSA.  P:   Continue Tracheostomy Collar q4hr on with Full Vent Support for rest for 4 hours Continue Solu-Medrol 40mg  IV daily for now Brovana & Budesonide neb q12hr Intermittent CXR  CARDIOVASCULAR A:  Abnormal EKG - ST elevation RCA distribution, resolved spontaneously without hemodynamic change, uncertain etiology. Seen on 7/28 & again 7/30. Suspect demand with hypotension in setting of moderate aortic stenosis. Shock - Likely sedation & CVVHD related. Resolved. H/O CAD, Carotid artery disease, chronic diastolic CHF, HTN, a fib s/p ablation, HLD, mod AS.  P:  Monitoring on telemetry Vitals per unit protocol  Hold ASA with concern about gastritis Holding heparin gtt  Lopressor VT bid  RENAL A:   Acute on Chronic Renal Failure Stage 3 - Off CVVHD 8/3. Stable. Hypomagnesemia - Resolved. Hypernatremia - Overall stable. Hyperchloremia - Overall stable.  P:   Trending UOP Monitoring  electrolytes & renal function daily Restart Free Water 200cc VT q6hr Continue 1/2 NS @ 75cc/hr  GASTROINTESTINAL A:   Abdominal Pain w/ Ileus vs Constipation - Improved after SMOG enema 8/4.  Still w/ sig residual constipation 8/7.  Got one dose of Relistor. Improved after another SMOG enema 8/7. Gastritis.  P:   NPO Continuing Trickle Tube Feedings Pepcid IV q24hr Senna bid VT  HEMATOLOGIC A:   Coagulopathy - Secondary to Coumadin. Resolved. Thrombocytopenia - Mild. Stable/improved Anemia - Likely chronic illness. Hgb stable. No signs of active bleeding. Leukocytosis - Mild worsening. Likely reactive & steroids.  P:  Trending cell counts & INR daily Heparin [place] for DVT prophylaxis  Heparin gtt held  SCDs Transfuse for Hgb <8.0 or signs of active bleeding  INFECTIOUS A:   HCAP - S/P Tx. Staph in blood culture - likely contaminate. Candidal scrotal rash. Pressure Ulcer R Ear FUO - UA unimpressive & Blood Cultures obtained  P:   Completed  course of Abx per ID Plan to re-culture for fever  ENDOCRINE A:   DM type II - Glucose controlled.  P:   Accu-Checks q4hr SSI per Moderate Algorithm  NEUROLOGIC A:   Acute Encephalopathy - Multifactorial from hypoxia and metabolic. Improving.  P:   RASS goal:  0 Limiting sedating medications D/C Fentanyl & Versed  DERMATOLOGICAL A: Sacral Pressure Ulcer R Ear Ulcer Groin/Skin Fold Rash  P: Wound care per WOC recs: sacral dressing change q 3, Interdry Ag to skin folds q5d  FAMILY UPDATE: Wife, son, and other family members updated on 8/7 by Dr. Ashok Cordia at bedside.  TODAY'S SUMMARY:  75 year old male with baseline RA and COPD and a recent admission for acute respiratory failure with hypoxemia was admitted on 7/24 with ARDS most likely due to HCAP but there has been question of underlying ILD related to his rheumatoid arthritis.  Has multi-organ failure with improving renal function. Resuming tracheostomy collar trials  today. Constipation resolved after enema. Cautious free water and trickle tube feedings enteric. Plan to repeat culture for any fever. Discontinuing fentanyl and Versed today. Likely disposition will be to LTAC in the next 48 hours.  I have spent a total of 32 minutes of critical care time today caring for the patient and reviewing the patient's electronic medical record.  Sonia Baller Ashok Cordia, M.D. [facility] Pulmonary & Critical Care Pager:  [phone] After 3pm or if no response, call [phone] 8:09 AM 01/18/16

## 2016-01-19 ENCOUNTER — Inpatient Hospital Stay
Admission: AD | Admit: 2016-01-19 | Discharge: 2016-01-21 | Disposition: A | Discharge status: Other Acute Inpatient Hospital | Payer: Medicare Other | Source: Ambulatory Visit | Attending: Internal Medicine | Admitting: Internal Medicine

## 2016-01-19 ENCOUNTER — Other Ambulatory Visit (HOSPITAL_COMMUNITY): Payer: Self-pay

## 2016-01-19 ENCOUNTER — Inpatient Hospital Stay (HOSPITAL_COMMUNITY): Payer: Medicare Other

## 2016-01-19 DIAGNOSIS — J9690 Respiratory failure, unspecified, unspecified whether with hypoxia or hypercapnia: Secondary | ICD-10-CM

## 2016-01-19 DIAGNOSIS — Z0189 Encounter for other specified special examinations: Secondary | ICD-10-CM

## 2016-01-19 DIAGNOSIS — Z4659 Encounter for fitting and adjustment of other gastrointestinal appliance and device: Secondary | ICD-10-CM

## 2016-01-19 DIAGNOSIS — T17908A Unspecified foreign body in respiratory tract, part unspecified causing other injury, initial encounter: Secondary | ICD-10-CM

## 2016-01-19 DIAGNOSIS — K759 Inflammatory liver disease, unspecified: Secondary | ICD-10-CM

## 2016-01-19 DIAGNOSIS — J9809 Other diseases of bronchus, not elsewhere classified: Secondary | ICD-10-CM

## 2016-01-19 LAB — RENAL FUNCTION PANEL
ANION GAP: 4 — AB (ref 5–15)
Albumin: 1.8 g/dL — ABNORMAL LOW (ref 3.5–5.0)
BUN: 49 mg/dL — ABNORMAL HIGH (ref 6–20)
CHLORIDE: 116 mmol/L — AB (ref 101–111)
CO2: 27 mmol/L (ref 22–32)
CREATININE: 1.38 mg/dL — AB (ref 0.61–1.24)
Calcium: 8.4 mg/dL — ABNORMAL LOW (ref 8.9–10.3)
GFR calc non Af Amer: 48 mL/min — ABNORMAL LOW (ref >60)
GFR, EST AFRICAN AMERICAN: 56 mL/min — AB (ref >60)
GLUCOSE: 197 mg/dL — AB (ref 65–99)
Phosphorus: 2.8 mg/dL (ref 2.5–4.6)
Potassium: 4 mmol/L (ref 3.5–5.1)
Sodium: 147 mmol/L — ABNORMAL HIGH (ref 135–145)

## 2016-01-19 LAB — CBC WITH DIFFERENTIAL/PLATELET
Basophils Absolute: 0 10*3/uL (ref 0.0–0.1)
Basophils Relative: 0 %
Eosinophils Absolute: 0 10*3/uL (ref 0.0–0.7)
Eosinophils Relative: 0 %
HCT: 28.5 % — ABNORMAL LOW (ref 39.0–52.0)
HEMOGLOBIN: 9 g/dL — AB (ref 13.0–17.0)
LYMPHS ABS: 0.6 10*3/uL — AB (ref 0.7–4.0)
LYMPHS PCT: 5 %
MCH: 30.2 pg (ref 26.0–34.0)
MCHC: 31.6 g/dL (ref 30.0–36.0)
MCV: 95.6 fL (ref 78.0–100.0)
MONOS PCT: 5 %
Monocytes Absolute: 0.6 10*3/uL (ref 0.1–1.0)
NEUTROS PCT: 90 %
Neutro Abs: 11.4 10*3/uL — ABNORMAL HIGH (ref 1.7–7.7)
Platelets: 119 10*3/uL — ABNORMAL LOW (ref 150–400)
RBC: 2.98 MIL/uL — AB (ref 4.22–5.81)
RDW: 19.1 % — ABNORMAL HIGH (ref 11.5–15.5)
WBC: 12.6 10*3/uL — AB (ref 4.0–10.5)

## 2016-01-19 LAB — GLUCOSE, CAPILLARY
GLUCOSE-CAPILLARY: 110 mg/dL — AB (ref 65–99)
Glucose-Capillary: 199 mg/dL — ABNORMAL HIGH (ref 65–99)
Glucose-Capillary: 183 mg/dL — ABNORMAL HIGH (ref 65–99)
Glucose-Capillary: 165 mg/dL — ABNORMAL HIGH (ref 65–99)

## 2016-01-19 LAB — C DIFFICILE QUICK SCREEN W PCR REFLEX
C Diff antigen: NEGATIVE
C Diff interpretation: NOT DETECTED
C Diff toxin: NEGATIVE

## 2016-01-19 LAB — MAGNESIUM: Magnesium: 1.7 mg/dL (ref 1.7–2.4)

## 2016-01-19 MED ORDER — FREE WATER
200.0000 mL | Status: DC
  Administered 2016-01-19: 200 mL

## 2016-01-19 MED ORDER — ACETAMINOPHEN 160 MG/5ML PO SOLN: 650.0000 mg | Freq: Four times a day (QID) | ORAL | 0 refills | Status: AC | PRN

## 2016-01-19 MED ORDER — TOBRAMYCIN 0.3 % OP SOLN: 2.0000 [drp] | OPHTHALMIC | Status: DC

## 2016-01-19 MED ORDER — INSULIN ASPART 100 UNIT/ML ~~LOC~~ SOLN: 0.0000 [IU] | SUBCUTANEOUS | 11 refills | Status: AC

## 2016-01-19 MED ORDER — RANITIDINE HCL 150 MG/10ML PO SYRP: 150.0000 mg | ORAL_SOLUTION | Freq: Two times a day (BID) | ORAL | 0 refills | Status: AC

## 2016-01-19 MED ORDER — BENZOCAINE (TOPICAL) 20 % EX AERO
INHALATION_SPRAY | Freq: Four times a day (QID) | CUTANEOUS | Status: DC | PRN
  Filled 2016-01-19: qty 57

## 2016-01-19 MED ORDER — ARFORMOTEROL TARTRATE 15 MCG/2ML IN NEBU: 15.0000 ug | INHALATION_SOLUTION | Freq: Two times a day (BID) | RESPIRATORY_TRACT | Status: AC

## 2016-01-19 MED ORDER — FREE WATER: 200.0000 mL | Status: AC

## 2016-01-19 MED ORDER — ALBUTEROL SULFATE (2.5 MG/3ML) 0.083% IN NEBU: 2.5000 mg | INHALATION_SOLUTION | RESPIRATORY_TRACT | 12 refills | Status: AC | PRN

## 2016-01-19 MED ORDER — HEPARIN SODIUM (PORCINE) 5000 UNIT/ML IJ SOLN: 5000.0000 [IU] | Freq: Three times a day (TID) | INTRAMUSCULAR | Status: AC

## 2016-01-19 MED ORDER — ONDANSETRON HCL 4 MG/2ML IJ SOLN: 4.0000 mg | Freq: Four times a day (QID) | INTRAMUSCULAR | 0 refills | Status: AC | PRN

## 2016-01-19 MED ORDER — METOPROLOL TARTRATE 25 MG/10 ML ORAL SUSPENSION: 12.5000 mg | Freq: Two times a day (BID) | ORAL | Status: AC

## 2016-01-19 MED ORDER — PREDNISONE 5 MG/ML PO CONC
40.0000 mg | Freq: Every day | ORAL | Status: DC
  Administered 2016-01-19: 40 mg
  Filled 2016-01-19: qty 8

## 2016-01-19 MED ORDER — BUDESONIDE 0.5 MG/2ML IN SUSP: 0.5000 mg | Freq: Two times a day (BID) | RESPIRATORY_TRACT | 12 refills | Status: AC

## 2016-01-19 MED ORDER — NYSTATIN 100000 UNIT/GM EX POWD: Freq: Three times a day (TID) | CUTANEOUS | 0 refills | Status: AC

## 2016-01-19 MED ORDER — PREDNISONE 5 MG/ML PO CONC: 40.0000 mg | Freq: Every day | ORAL | 0 refills | Status: AC

## 2016-01-19 NOTE — Discharge Summary (Signed)
Physician Discharge Summary       Patient ID: Curtis Frazier MRN: [id number] DOB/AGE: 17-Jul-1940 75 y.o.  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 H/O OSA. Abnormal EKG . Shock  H/O CAD Carotid artery disease,  chronic diastolic CHF HTN a fib s/p ablation HLD mod AS. Acute on Chronic Renal Failure Stage 3 - Off CVVHD 8/3. Stable. Hypomagnesemia - Resolved. Hypernatremia  Hyperchloremia Abdominal Pain w/ Ileus vs Constipation - Gastritis. Coagulopathy - Secondary to Coumadin.  Thrombocytopenia - Anemia - Likely chronic illness. Leukocytosis HCAP Staph in blood culture - likely contaminate. Candidal scrotal rash. FUO - UA unimpressive & Blood Cultures obtained   DM type II Acute Encephalopathy Sacral Pressure Ulcer R Ear Ulcer Groin/Skin Fold Rash Detailed Hospital Course:  75 year old male with baseline RA and COPD and a recent admission for acute respiratory failure with hypoxemia.  He was admitted on 7/24 with ARDS most likely due to HCAP but there has been question of underlying ILD related to his rheumatoid arthritis. His course high-lights were as follows:  7/22 - discharge to SNF 7/24 - Admit, intubated for respiratory failure & septic shock: treated in usual fashion: volume resuscitation, vaso-active gtts, empiric ABX and supportive care. He was pan-cultured.  7/25 - worsening acidosis, off pressors, ARDS protocol, Nimbex gtt started. CVVHD initiated 7/26 - Nimbex discontinued  7/30 - hypotension with sedation, vasopressors restarted, ST segment changes  7/31 - vomiting overnight, NGT to suction 8/02 - off pressors 8/03 - trach 8/04 - SVT vs Sinus Tach w/ PVCs along w/ new fever 8/07 - abd pain; CT showing constipation. Repeating SMOG -->constipation resolved 8/8 - ATC collar trials  started.  8/9 - pull NGT out. This was replaced. Looking good on ATC. Does have some purulent sputum so respiratory culture was sent. Ready for transfer to [facility] w/ plan as outlined below.  LINES/TUBES:  Discharge Plan by active problems    Ventilator Dependence/Tracheostomy Dependence d/t - ARDS w/ HCAP. 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 s/p perc trach 8/3 H/O COPD with emphysema. H/O OSA. Purulent sputum 8/9 Plan:   Weaning protocol per [facility]  Prednisone 40 q/d then slow taper over 4 weeks Brovana & Budesonide neb q12hr Intermittent CXR Sputum sent 8/9, needs f/u.   H/O CAD, Carotid artery disease, chronic diastolic CHF, HTN, a fib s/p ablation, HLD, mod AS. Abnormal EKG - ST elevation RCA distribution, resolved spontaneously without hemodynamic change, uncertain etiology. Seen on 7/28 & again 7/30. Suspect demand with hypotension in setting of moderate aortic stenosis. Plan  Monitoring on telemetry Vitals per unit protocol  Hold ASA with concern about gastritis Holding heparin gtt & coumadin. At some point this can be re-addressed-->would consider repeat FOB first.  Lopressor VT bid   Acute on Chronic Renal Failure Stage 3 - Off CVVHD 8/3. Stable. Hypernatremia - Overall stable & improved  Hyperchloremia - Overall stable. Plan   Trending UOP Cont free water replacement Have placed IVFs to [facility] Continue to follow chemistries   Acute Encephalopathy - Multifactorial from hypoxia and metabolic. Improving. Plan   RASS goal:  0 Limiting sedating medications  Severe Physical Deconditioning Plan Needs intensive PT and OT services  Severe Protein calorie malnutrition Critical illness associated Dysphagia  Gastritis. Plan:   NPO-->will need SLP eval for swallowing assessment recommend Osmolite 1.5 (or equivalent available at [facility]) @ 70 mL/hr  over 18 hours (1400-0800) which will provide 1890 kcal, 79 grams of protein, and 960 mL free water.  Pepcid IV q24hr Senna bid  VT   Thrombocytopenia - Mild. Stable/improved Anemia - Likely chronic illness. Hgb stable. No signs of active bleeding. Plan  Trending cell counts & INR daily Heparin  for DVT prophylaxis  SCDs Transfuse for Hgb <8.0 or signs of active bleeding  Candidal scrotal rash Plan Cont topical therapy   DM type II - Glucose controlled. Plan:   Accu-Checks q4hr SSI per Moderate Algorithm   Sacral Pressure Ulcer R Ear Ulcer Groin/Skin Fold Rash Plan: Wound care per WOC recs: sacral dressing change q 3, Interdry Ag to skin folds q5d Would recommend Wound nurse follow up at [facility] tests/ studies  Consults   LINES/TUBES: ETT 8.5 7/24 - 8/3 L Subclavian CVL 7/25 - 8/3 pulled out by pt. R IJ HD CVL 7/25 - 8/7 Trach #8 8/3 (DF)>> R Nare NGT 8/3>> pt removed New FT placed 8/9 PIV x1   STUDIES:  CTA Chest 7/25:  b/l GGO and interlobular septal thickening, bibasilar consolidation, mediastinal LAN TTE 7/26: mild LVH, EF 55 to 123456, grade 1 diastolic CHF, mod AS, mod TR, severe LA dilation, PAS 40 mmHg  Port CXR 8/4:  Tracheostomy & R IJ CVL in good position. Enteric tube below diaphragm. Port CXR 8/5: Low lung volumes with slightly lordotic view. Improving bilateral opacities with decreased silhouetting left hemidiaphragm. The central venous catheter, enteric tube, and tracheostomy in good position. No new opacity appreciated. CT Abd/Plevis 8/6: diffuse gaseous pattern w/ mild to marked stool burden c/w constipation. Bibasilar airspace disease   MICROBIOLOGY: Respiratory Viral Panel PCR 7/15:  Negative  Blood Ctx x4 7/24: 1/4 Coag Neg Staph Tracheal Asp Ctx 7/24:  Oral Flora Respiratory Viral Panel PCR 7/24:  Negative  Tracheal Asp Ctx 7/27: Normal respiratory flora Blood Ctx 7/27:  Negative  Ear Viral Ctx 8/1 >>neg Blood Ctx x2 8/5 >>neg  Sputum culture 8/9>>>  ANTIBIOTICS: Aztreonam 7/24 - 7/25 Imipenem 7/24 - 7/25 Vancomycin 7/24 -  7/25 Levaquin 7/25 - 8/1 Linezolid 7/25 - 8/4 Meropenem 7/25 - 8/5 Anidulofungin 7/27 - 8/7  Discharge Exam: BP (!) 122/55   Pulse 75   Temp 98.6 F (37 C) (Oral)   Resp 16   Ht 5\' 8"  (1.727 m)   Wt 147 lb 4.3 oz (66.8 kg)   SpO2 91%   BMI 22.39 kg/m   General: Daughter at bedside. No distress. Awake. Interactive  Neuro: Following commands. Moving all 4 extremities equally. Nods to questions.  HEENT: Tracheostomy in place. No scleral icterus. Some purulent sputum strong cough  Cardiovascular: Regular rate & rhythm. No edema. Pulmonary: scattered rhonchi. No accessory use  Abdomen: Soft. Protuberant. Hypoactive bowel sounds. Integument: sacral wound dressing intact. Has barrier dressings on elbows and interdry Ag in skin folds.    Labs at discharge Lab Results  Component Value Date   CREATININE 1.38 (H) 01/19/2016   BUN 49 (H) 01/19/2016   NA 147 (H) 01/19/2016   K 4.0 01/19/2016   CL 116 (H) 01/19/2016   CO2 27 01/19/2016   Lab Results  Component Value Date   WBC 12.6 (H) 01/19/2016   HGB 9.0 (L) 01/19/2016   HCT 28.5 (L) 01/19/2016   MCV 95.6 01/19/2016   PLT 119 (L) 01/19/2016   Lab Results  Component Value Date   ALT 190 (H) 01/17/2016   AST 133 (H) 01/17/2016  ALKPHOS 293 (H) 01/17/2016   BILITOT 1.4 (H) 01/17/2016   Lab Results  Component Value Date   INR 1.29 01/17/2016   INR 1.39 01/16/2016   INR 1.43 01/15/2016    Current radiology studies No results found.  Disposition:  03-Skilled Nursing Facility  Discharge Instructions    Increase activity slowly    Complete by:  As directed       Medication List    STOP taking these medications   acetaminophen 325 MG tablet Commonly known as:  TYLENOL Replaced by:  acetaminophen 160 MG/5ML solution   diltiazem 240 MG 24 hr capsule Commonly known as:  CARDIZEM CD   ferrous sulfate 325 (65 FE) MG tablet   insulin detemir 100 UNIT/ML injection Commonly known as:  LEVEMIR   pantoprazole  40 MG tablet Commonly known as:  PROTONIX   pravastatin 20 MG tablet Commonly known as:  PRAVACHOL   predniSONE 5 MG tablet Commonly known as:  DELTASONE Replaced by:  predniSONE 5 MG/ML concentrated solution   thiamine 100 MG tablet   tiotropium 18 MCG inhalation capsule Commonly known as:  SPIRIVA   warfarin 2.5 MG tablet Commonly known as:  COUMADIN   warfarin 5 MG tablet Commonly known as:  COUMADIN     TAKE these medications   acetaminophen 160 MG/5ML solution Commonly known as:  TYLENOL Place 20.3 mLs (650 mg total) into feeding tube every 6 (six) hours as needed for fever. Replaces:  acetaminophen 325 MG tablet   albuterol (2.5 MG/3ML) 0.083% nebulizer solution Commonly known as:  PROVENTIL Take 3 mLs (2.5 mg total) by nebulization every 2 (two) hours as needed for wheezing. What changed:  when to take this  reasons to take this  Another medication with the same name was removed. Continue taking this medication, and follow the directions you see here.   arformoterol 15 MCG/2ML Nebu Commonly known as:  BROVANA Take 2 mLs (15 mcg total) by nebulization 2 (two) times daily.   budesonide 0.5 MG/2ML nebulizer solution Commonly known as:  PULMICORT Take 2 mLs (0.5 mg total) by nebulization 2 (two) times daily.   calcium-vitamin D 500-200 MG-UNIT tablet Commonly known as:  OSCAL WITH D Take 1 tablet by mouth daily with breakfast.   docusate sodium 100 MG capsule Commonly known as:  COLACE Take 100 mg by mouth 2 (two) times daily. Reported on 09/22/2015   free water Soln Place 200 mLs into feeding tube every 4 (four) hours.   glucose blood test strip Commonly known as:  ONE TOUCH ULTRA TEST Check blood sugar three times daily and as directed. Dx E11.8 diabetes that is poorly controlled   heparin 5000 UNIT/ML injection Inject 1 mL (5,000 Units total) into the skin every 8 (eight) hours.   insulin aspart 100 UNIT/ML injection Commonly known as:   novoLOG Inject 0-15 Units into the skin every 4 (four) hours. What changed:  how much to take  how to take this  when to take this  additional instructions   metoprolol tartrate 25 mg/10 mL Susp Commonly known as:  LOPRESSOR Place 5 mLs (12.5 mg total) into feeding tube 2 (two) times daily.   nystatin powder Commonly known as:  MYCOSTATIN/NYSTOP Apply topically 3 (three) times daily.   ondansetron 4 MG/2ML Soln injection Commonly known as:  ZOFRAN Inject 2 mLs (4 mg total) into the vein every 6 (six) hours as needed for nausea or vomiting.   predniSONE 5 MG/ML concentrated solution Place 8 mLs (40  mg total) into feeding tube daily. Replaces:  predniSONE 5 MG tablet   ranitidine 150 MG/10ML syrup Commonly known as:  ZANTAC Place 10 mLs (150 mg total) into feeding tube 2 (two) times daily.        Discharged Condition: good PCCM will continue to follow.   Physician Statement:   The Patient was personally examined, the discharge assessment and plan has been personally reviewed and I agree with ACNP Babcock's assessment and plan. > 30 minutes of time have been dedicated to discharge assessment, planning and discharge instructions.   Signed: Clementeen Graham 01/19/2016, 11:23 AM  PCCM Attending Note: Discharge summary reviewed. Patient plan for disposition to long-term acute care facility able to continue tracheostomy collar trials with intermittent ventilator support for rest while weaning. I have spent a total of 21 minutes of time personally today planning the patient's discharge and further plan of care.  Sonia Baller Ashok Cordia, M.D. [facility] Pulmonary & Critical Care Pager:  [phone] After 3pm or if no response, call [phone] 2:36 PM 01/19/16

## 2016-01-19 NOTE — Consult Note (Signed)
Name: Curtis Frazier MRN: [id number] DOB: Jun 21, 1940    ADMISSION DATE:  01/19/2016 CONSULTATION DATE:  8/9  REFERRING MD :  Dr. Laren Everts  CHIEF COMPLAINT:  Trach dependent   SIGNIFICANT EVENTS  8/9 transfer to [facility].   STUDIES:    HISTORY OF PRESENT ILLNESS:  75 year old male with baseline RA and COPD and a recent admission for acute respiratory failure with hypoxemia.  He was admitted on 7/24 with ARDS most likely due to HCAP but there has been question of underlying ILD related to his rheumatoid arthritis. He was intubated for respiratory failure & septic shock and treated in usual fashion with volume resuscitation, vaso-active gtts, empiric ABX and supportive care. He went on to require ARDS protocol and neuromuscular blockade. He required CRRT for refractory acidosis. Sedation caused hypotension requiring vasopressor use. He was eventually weaned from high-PEEP/fio2 and pressors, but he could not be weaned from the ventilatory requiring tracheostomy 8/3. Since tracheostomy he has been weaning well and had been tolerating ATC during waking hours. 8/9 he was transferred to [facility] for [facility] rehab. Pulmonary has been asked to see.   PAST MEDICAL HISTORY :   has a past medical history of Actinic keratosis; Arthritis; Bruises easily; CAD (coronary artery disease); Carotid arterial disease ([place]); Chronic back pain; Chronic diastolic CHF (congestive heart failure) (HCC); CKD (chronic kidney disease), stage III; COPD (chronic obstructive pulmonary disease) ([place]) (dx'd 12/10/2015); Degeneration of intervertebral disc, site unspecified; Dyspnea on exertion; Esophageal reflux; Gastritis (12/10); GI bleed; Hemorrhoids; History of blood transfusion (2010); History of colonic polyps; Hyperlipidemia; Hypertensive heart disease; Joint pain; Lumbar stenosis; Moderate aortic stenosis; Nephrolithiasis; Other psoriasis; Paroxysmal atrial fibrillation ([place]); Personal history of colonic polyps; PMR  (polymyalgia rheumatica) ([place]) (01/20/2012); Pneumonia (2008; 07/2015); Psoriasis; Psoriatic arthropathy ([place]); Rheumatoid arthritis(714.0); Scoliosis (and kyphoscoliosis), idiopathic; Skin cancer; Sleep apnea; STEMI (ST elevation myocardial infarction) ([place]) (02/2010); and Type II diabetes mellitus ([place]).  has a past surgical history that includes Shoulder open rotator cuff repair (Left, 2000); Colonoscopy; Vasectomy (1979); carotid doppler (10/12); Cataract extraction w/ intraocular lens  implant, bilateral (Bilateral, 2012); Lithotripsy (2009); TEE without cardioversion (N/A, 08/06/2015); Cardioversion (N/A, 08/06/2015); Back surgery; Lumbar laminectomy (07/2015); Posterior laminectomy thoracic spine; Skin cancer excision; TEE without cardioversion (N/A, 09/14/2015); Cardioversion (N/A, 09/14/2015); TEE without cardioversion (N/A, 11/04/2015); Cardioversion (N/A, 11/04/2015); Cardiac catheterization (N/A, 12/15/2015); Coronary angioplasty with stent (02/2009); Cardiac catheterization (2010); and Laminectomy thoracic fusion posterior (06/2011). Prior to Admission medications   Medication Sig Start Date End Date Taking? Authorizing Provider  acetaminophen (TYLENOL) 160 MG/5ML solution Place 20.3 mLs (650 mg total) into feeding tube every 6 (six) hours as needed for fever. 01/19/16   Erick Colace, NP  albuterol (PROVENTIL) (2.5 MG/3ML) 0.083% nebulizer solution Take 3 mLs (2.5 mg total) by nebulization every 2 (two) hours as needed for wheezing. 01/19/16   Erick Colace, NP  arformoterol (BROVANA) 15 MCG/2ML NEBU Take 2 mLs (15 mcg total) by nebulization 2 (two) times daily. 01/19/16   Erick Colace, NP  budesonide (PULMICORT) 0.5 MG/2ML nebulizer solution Take 2 mLs (0.5 mg total) by nebulization 2 (two) times daily. 01/19/16   Erick Colace, NP  calcium-vitamin D (OSCAL WITH D) 500-200 MG-UNIT tablet Take 1 tablet by mouth daily with breakfast.    Historical Provider, MD  docusate sodium (COLACE) 100 MG capsule Take  100 mg by mouth 2 (two) times daily. Reported on 09/22/2015    Historical Provider, MD  glucose blood (ONE TOUCH ULTRA  TEST) test strip Check blood sugar three times daily and as directed. Dx E11.8 diabetes that is poorly controlled 11/15/15   Abner Greenspan, MD  heparin 5000 UNIT/ML injection Inject 1 mL (5,000 Units total) into the skin every 8 (eight) hours. 01/19/16   Erick Colace, NP  insulin aspart (NOVOLOG) 100 UNIT/ML injection Inject 0-15 Units into the skin every 4 (four) hours. 01/19/16   Erick Colace, NP  metoprolol tartrate (LOPRESSOR) 25 mg/10 mL SUSP Place 5 mLs (12.5 mg total) into feeding tube 2 (two) times daily. 01/19/16   Erick Colace, NP  nystatin (MYCOSTATIN/NYSTOP) powder Apply topically 3 (three) times daily. 01/19/16   Erick Colace, NP  ondansetron [facility]) 4 MG/2ML SOLN injection Inject 2 mLs (4 mg total) into the vein every 6 (six) hours as needed for nausea or vomiting. 01/19/16   Erick Colace, NP  predniSONE 5 MG/ML concentrated solution Place 8 mLs (40 mg total) into feeding tube daily. 01/19/16   Erick Colace, NP  ranitidine (ZANTAC) 150 MG/10ML syrup Place 10 mLs (150 mg total) into feeding tube 2 (two) times daily. 01/19/16   Erick Colace, NP  Water For Irrigation, Sterile (FREE WATER) SOLN Place 200 mLs into feeding tube every 4 (four) hours. 01/19/16   Erick Colace, NP   Allergies  Allergen Reactions  . Ace Inhibitors Other (See Comments)    Causes high K   . Angiotensin Receptor Blockers Other (See Comments)    Causes high K   . Metoprolol Other (See Comments)    May cause elevated K level   . Penicillins Swelling and Rash    Has patient had a PCN reaction causing immediate rash, facial/tongue/throat swelling, SOB or lightheadedness with hypotension: YES Has patient had a PCN reaction causing severe rash involving mucus membranes or skin necrosis: NO Has patient had a PCN reaction that required hospitalization NO Has patient had a PCN reaction  occurring within the last 10 years: NO If all of the above answers are "NO", then may proceed with Cephalosporin use. Pt has tolerated cephalosporins in the past   . Amiodarone Other (See Comments)    12/27/15: Cardiologist discontinued the amiodarone (on PTA) due to  concern for toxicity  (in hospitial with acute hypoxemic respiratory failure)   . Tetracycline Swelling and Rash    FAMILY HISTORY:  family history includes CAD in his mother and sister; Coronary artery disease in his mother and sister; Diabetes in his brother, brother, and mother; Hypertension in his sister; Kidney failure in his sister; Pancreatic cancer in his brother; Prostate cancer in his brother. SOCIAL HISTORY:  reports that he quit smoking about 7 years ago. His smoking use included Cigarettes. He has a 79.50 pack-year smoking history. He has never used smokeless tobacco. He reports that he drinks alcohol. He reports that he does not use drugs.  REVIEW OF SYSTEMS:  Limited by tracheostomy status Bolds are positive  Constitutional: weight loss, gain, night sweats, Fevers, chills, fatigue .  HEENT: headaches, Sore throat, sneezing, nasal congestion, post nasal drip, Difficulty swallowing, Tooth/dental problems, visual complaints visual changes, ear ache CV:  chest pain, radiates: ,Orthopnea, PND, swelling in lower extremities, dizziness, palpitations, syncope.  GI  heartburn, indigestion, abdominal pain, nausea, vomiting, diarrhea, change in bowel habits, loss of appetite, bloody stools.  Resp: cough, productive: , hemoptysis, dyspnea, chest pain, pleuritic.  Skin: rash or itching or icterus GU: dysuria, change in color of urine, urgency or frequency. flank  pain, hematuria  MS: joint pain or swelling. decreased range of motion  Psych: change in mood or affect. depression or anxiety.  Neuro: difficulty with speech, weakness, numbness, ataxia    SUBJECTIVE:   VITAL SIGNS: Temp:  [98.4 F (36.9 C)-98.6 F (37 C)]  98.4 F (36.9 C) (08/09 1200) Pulse Rate:  [74-84] 76 (08/09 1146) Resp:  [14-26] 18 (08/09 1400) BP: (95-150)/(42-91) 104/58 (08/09 1400) SpO2:  [90 %-100 %] 98 % (08/09 1400) FiO2 (%):  [28 %-30 %] 28 % (08/09 1146) Weight:  [66.8 kg (147 lb 4.3 oz)] 66.8 kg (147 lb 4.3 oz) (08/09 0418)  PHYSICAL EXAMINATION: General:  Male of normal body habitus in NAD Neuro:  Alert, oriented, non-focal HEENT:  [place]/AT, #6 cuffed trach in place Cardiovascular:  RRR, no MRG Lungs:  Coarse bases, mild/mod secretions Abdomen:  Soft, non-tender, non-distended Musculoskeletal:  No acute deformity  Skin:  Grossly intact   Recent Labs Lab 01/17/16 0600 01/18/16 0418 01/19/16 0345  NA 146* 149* 147*  K 3.9 4.2 4.0  CL 113* 115* 116*  CO2 29 28 27   BUN 56* 60* 49*  CREATININE 1.57* 1.64* 1.38*  GLUCOSE 96 147* 197*    Recent Labs Lab 01/17/16 0600 01/18/16 0418 01/19/16 0345  HGB 8.7* 9.4* 9.0*  HCT 28.1* 30.2* 28.5*  WBC 16.9* 19.0* 12.6*  PLT 108* 123* 119*   Dg Chest Port 1 View  Result Date: 01/19/2016 CLINICAL DATA:  Respiratory failure, history of chronic diastolic congestive heart failure EXAM: PORTABLE CHEST 1 VIEW COMPARISON:  01/15/2016 FINDINGS: Cardiomediastinal silhouette is stable. There is NG feeding tube with tip in mid stomach. Stable tracheostomy tube position. Metallic fixation rods noted lower thoracic and lumbar spine. There is mild interstitial prominence bilaterally suspicious for mild interstitial edema. No segmental infiltrates. Stable left basilar atelectasis or scarring. IMPRESSION: Stable tracheostomy tube position. NG feeding tube with tip in mid stomach. Mild interstitial prominence bilateral suspicious for mild interstitial edema. No segmental infiltrate. Electronically Signed   By: Lahoma Crocker M.D.   On: 01/19/2016 16:18   Dg Abd Portable 1v  Result Date: 01/19/2016 CLINICAL DATA:  NG tube placement, history of gastritis EXAM: PORTABLE ABDOMEN - 1 VIEW  COMPARISON:  None. FINDINGS: There is normal small bowel gas pattern. There is NG feeding tube with tip in mid stomach. IMPRESSION: NG feeding tube with tip in mid stomach. Normal small bowel gas pattern. Electronically Signed   By: Lahoma Crocker M.D.   On: 01/19/2016 16:19   Dg Abd Portable 1v  Result Date: 01/19/2016 CLINICAL DATA:  Feeding tube placement. EXAM: PORTABLE ABDOMEN - 1 VIEW COMPARISON:  01/13/2016 FINDINGS: Weighted feeding tube is been placed with the tip located roughly at the level of the mid stomach. Visualized upper abdominal bowel gas pattern is unremarkable. IMPRESSION: Feeding tube tip is roughly at the level of the mid stomach. Electronically Signed   By: Aletta Edouard M.D.   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 H/O COPD with emphysema. H/O OSA.  Recommendations: Wean from vent entirely over next 3-4 days, with goal to wean to 16, 20, 24 hours ATC over the next 3 days.  OK to rest on vent fully tonight.  Prednisone 40 q/d then slow taper over 4 weeks Brovana & Budesonide neb q12hr Intermittent CXR Follow 8/9 sputum culture  Georgann Housekeeper, AGACNP-BC [facility] Pulmonology/Critical Care Pager [phone] or [phone]  01/19/2016  5:12 PM  STAFF NOTE: I, Merrie Roof, MD FACP have personally reviewed patient's available data, including medical history, events of note, physical examination and test results as part of my evaluation. I have discussed with resident/NP and other care providers such as pharmacist, RN and RRT. In addition, I personally evaluated patient and elicited key findings of:  Awake, cooperative, mild secretions at end of trach, lungs clear anterior mild course, abdo soft, no edema, pcxr is progressively improved, his renal fxn is resolved, remains with mild hypernatremia/ hyperchloremia, need to continued some free water, keep even balance,  would keep on vent tonight after about 12-14 hours today of trach collar, then over next 3 days progress to 24 hours off vent, would drop cuff trach when on trach collar and get PMV assessment in am with direct observation, the longer he stays of f vent support lowers his chances of vap, he had prior dysphagia even without trach, so his chancs of successful swallow are low , need objective assessment with speech / swallow, will follow, I updated children and wife in room and d/w Dr Virgina Organ. [name], MD, [place] Pgr: [facility] 01/19/2016 9:13 PM

## 2016-01-19 NOTE — Progress Notes (Signed)
CM met with Doroteo Bradford from [place] and plan is for transfer to [place] today; DC summary written.  Unit RN and Doctor, hospital for [employer].  No other CM needs were communicated.

## 2016-01-19 NOTE — Progress Notes (Signed)
Patient placed on 28% trach collar.  Sats currently 95%.  Vitals are stable.  Patient is tolerating well.  Will continue to monitor.

## 2016-01-19 NOTE — Progress Notes (Signed)
Nutrition Follow-up  DOCUMENTATION CODES:   Non-severe (moderate) malnutrition in context of acute illness/injury  INTERVENTION:  - If TF re-start necessary, recommend Osmolite 1.5 (or equivalent available at [facility]) @ 70 mL/hr over 18 hours (1400-0800) which will provide 1890 kcal, 79 grams of protein, and 960 mL free water.  - If pt unable to d/c today, RD will follow-up 8/10.  NUTRITION DIAGNOSIS:   Inadequate oral intake related to inability to eat as evidenced by NPO status. -ongoing  GOAL:   Patient will meet greater than or equal to 90% of their needs -unmet  MONITOR:   Diet advancement, Weight trends, Labs, Skin, I & O's  ASSESSMENT:   75 year old male with PMH as below which is significant for Diastolic CHF, CKD, COPD, PAF (s/p recent ablation), and rheumatoid arthritis (recently on remicade, now on chronic pred 5mg ). He has several recent hospitalizations. Most recently he was admitted 7/15 for presumed HCAP. He was streated with 9 days of HCAP antibiotics and was transferred to [facility] 7/22. 7/24 he was noted to have AMS to the point where he was essentially comatose. O2 sats noted to be about 60% on room air at that time. He was transported to [facility] ED for further evaluation where he was placed on BiPAP with some improvement. CXR was performed which is consistent with interstitial lung changes noted on very recent CT.  Pt currently on trach collar. Spoke with CCM NP who reports pt pulled NGT overnight (RN note from early AM states attempt x3 to reinsert NGT was unsuccessful) and plan is for swallow evaluation with diet advancement and hopeful for d/c to [facility] today. If pt unable to pass swallow evaluation, TF recommendations outlined above. Weight remains stable overall.    Pt unable to meet needs at this time.  Medications reviewed; sliding scale Novolog, PRN Zofran, 40 mg Prednisone/day. Labs reviewed; CBGs: 165-199 mg/dL this AM, Na: 147 mmol/L, Cl: 116 mmol/L, BUN:  49 mg/dL, creatinine: 1.38 mg/dL, Ca: 8.4 mg/dL, GFR: 48 mL/min.    Diet Order:     Skin:  Wound (see comment) (L ear bruising)  Last BM:  8/8  Height:   Ht Readings from Last 1 Encounters:  01/05/16 5\' 8"  (1.727 m)    Weight:   Wt Readings from Last 1 Encounters:  01/19/16 147 lb 4.3 oz (66.8 kg)    Ideal Body Weight:  70 kg (kg)  BMI:  Body mass index is 22.39 kg/m.  Estimated Nutritional Needs:   Kcal:  1775-1975 (27-30 kcal/kg)  Protein:  79-92 grams (1.2-1.4 grams/kg)  Fluid:  1.8-2 L/day  EDUCATION NEEDS:   No education needs identified at this time    Jarome Matin, MS, RD, LDN Inpatient Clinical Dietitian Pager # [phone] After hours/weekend pager # [phone]

## 2016-01-19 NOTE — Progress Notes (Signed)
RN and charge nurse attempted three times to reinsert NG tube. Attempts unsuccessful due to meeting resistance. Will continue to monitor.

## 2016-01-19 NOTE — Evaluation (Signed)
Physical Therapy Evaluation Patient Details Name: Curtis Frazier MRN: [id number] DOB: 1940/10/05 Today's Date: 01/19/2016   History of Present Illness  Pt is a 75 year old male admitted for ARDS w/ HCAP (Ventilator Dependence/Tracheostomy Dependence) Possible Rheumatoid Pneumonitis. PMH - CAD, DM, MI, CHF, GI bleed, HTN, afib  Clinical Impression  Pt admitted with above diagnosis. Pt currently with functional limitations due to the deficits listed below (see PT Problem List).  Pt will benefit from skilled PT to increase their independence and safety with mobility to allow discharge to the venue listed below.  Pt likely to d/c to LTAC today however agreeable to initiate mobility.  Pt assisted to standing x2 however as expected fatigues quickly and requires mod-max assist.  Pt remained on trach collar and SPO2 remained in 90s during session.       Follow Up Recommendations LTACH;Supervision/Assistance - 24 hour    Equipment Recommendations  None recommended by PT    Recommendations for Other Services       Precautions / Restrictions Precautions Precautions: Fall Precaution Comments: trach collar      Mobility  Bed Mobility Overal bed mobility: Needs Assistance Bed Mobility: Supine to Sit;Sit to Supine     Supine to sit: Mod assist;+2 for safety/equipment;HOB elevated Sit to supine: +2 for safety/equipment;HOB elevated;Total assist   General bed mobility comments: multimodal cues for technique, assist for trunk upright, more assist for back to bed due to fatigue and weakness  Transfers Overall transfer level: Needs assistance Equipment used: Rolling walker (2 wheeled) Transfers: Sit to/from Stand Sit to Stand: Mod assist;+2 physical assistance         General transfer comment: verbal cues for technique, assist to rise and steady, unable to hold standing position more then 30 sec, performed twice  Ambulation/Gait                Stairs            Wheelchair  Mobility    Modified Rankin (Stroke Patients Only)       Balance Overall balance assessment: Needs assistance Sitting-balance support: Bilateral upper extremity supported;Feet supported Sitting balance-Leahy Scale: Poor     Standing balance support: Bilateral upper extremity supported;During functional activity Standing balance-Leahy Scale: Zero                               Pertinent Vitals/Pain Pain Assessment: Faces Faces Pain Scale: No hurt    Home Living Family/patient expects to be discharged to:: Skilled nursing facility                 Additional Comments: Pt with recent admission for HCAP and was discharged to SNF for rehab.  Plans per chart for d/c to LTAC    Prior Function Level of Independence: Independent with assistive device(s)               Hand Dominance        Extremity/Trunk Assessment   Upper Extremity Assessment: Generalized weakness           Lower Extremity Assessment: Generalized weakness         Communication   Communication: Tracheostomy  Cognition Arousal/Alertness: Awake/alert Behavior During Therapy: WFL for tasks assessed/performed Overall Cognitive Status: Within Functional Limits for tasks assessed                      General Comments      Exercises  Assessment/Plan    PT Assessment    PT Diagnosis Difficulty walking;Generalized weakness   PT Problem List Decreased strength;Decreased knowledge of use of DME;Decreased activity tolerance;Decreased mobility;Decreased balance;Decreased skin integrity;Cardiopulmonary status limiting activity  PT Treatment Interventions DME instruction;Gait training;Functional mobility training;Therapeutic activities;Therapeutic exercise;Balance training;Patient/family education   PT Goals (Current goals can be found in the Care Plan section) Acute Rehab PT Goals PT Goal Formulation: With patient/family Time For Goal Achievement:  01/26/16 Potential to Achieve Goals: Good    Frequency Min 3X/week   Barriers to discharge        Co-evaluation               End of Session Equipment Utilized During Treatment: Gait belt;Oxygen Activity Tolerance: Patient limited by fatigue Patient left: in bed;with call bell/phone within reach;with bed alarm set;with family/visitor present;with nursing/sitter in room Nurse Communication: Mobility status         Time: 1140-1201 PT Time Calculation (min) (ACUTE ONLY): 21 min   Charges:   PT Evaluation $PT Eval Moderate Complexity: 1 Procedure     PT G Codes:        Nuh Lipton,KATHrine E 01/19/2016, 1:05 PM Carmelia Bake, PT, DPT 01/19/2016 Pager: [phone]

## 2016-01-19 NOTE — Progress Notes (Signed)
PULMONARY / CRITICAL CARE MEDICINE   Name: Curtis Frazier MRN: [id number] DOB: [birth year]    ADMISSION DATE:  01/03/2016 CONSULTATION DATE:  01/03/2016  REFERRING MD:  Dr. Dayna Barker EDP  CHIEF COMPLAINT:  SOB/AMS  HISTORY:   75 y/o male presented with lethargy, hypoxia, hypotension from ARDS with concern for HCAP versus acute pneumonitis with hx of rheumatoid arthritis..  SUBJECTIVE:  Patient pulled out NG tube overnight. Patient continues to have acceptable bowel movements.  REVIEW OF SYSTEMS: Unable to assess given the ventilator and tracheostomy.  VITAL SIGNS: BP (!) 104/58   Pulse 76   Temp 98.4 F (36.9 C) (Oral)   Resp 18   Ht 5\' 8"  (1.727 m)   Wt 147 lb 4.3 oz (66.8 kg)   SpO2 98%   BMI 22.39 kg/m   HEMODYNAMICS:    VENTILATOR SETTINGS: Vent Mode: PRVC FiO2 (%):  [28 %-30 %] 28 % Set Rate:  [18 bmp] 18 bmp Vt Set:  [500 mL] 500 mL PEEP:  [5 cmH20] 5 cmH20  INTAKE / OUTPUT: I/O last 3 completed shifts: In: 3866.3 [I.V.:2700; NG/GT:1166.3] Out: 2600 [Urine:2600]  PHYSICAL EXAMINATION: General:  No family at bedside. Currently on tracheostomy collar. Comfortable.  Neuro: Following commands. Moving all 4 extremities equally. Nods to questions.  HEENT: Tracheostomy in place. No scleral icterus. NG tube out.  Cardiovascular: Regular rate & rhythm. No edema. sinus rhythm on telemetry.  Pulmonary: Clear bilaterally with auscultation. normal work of breathing on tracheostomy collar.  Abdomen: Soft. Protuberant. Hypoactive bowel sounds. Integument: Warm & dry.  LABS:  BMET  Recent Labs Lab 01/17/16 0600 01/18/16 0418 01/19/16 0345  NA 146* 149* 147*  K 3.9 4.2 4.0  CL 113* 115* 116*  CO2 29 28 27   BUN 56* 60* 49*  CREATININE 1.57* 1.64* 1.38*  GLUCOSE 96 147* 197*    Electrolytes  Recent Labs Lab 01/17/16 0600 01/18/16 0418 01/19/16 0345  CALCIUM 8.5* 8.5* 8.4*  MG 1.6* 1.9 1.7  PHOS 2.8 3.2 2.8    CBC  Recent Labs Lab 01/17/16 0600  01/18/16 0418 01/19/16 0345  WBC 16.9* 19.0* 12.6*  HGB 8.7* 9.4* 9.0*  HCT 28.1* 30.2* 28.5*  PLT 108* 123* 119*    Coag's  Recent Labs Lab 01/15/16 0500 01/16/16 0418 01/17/16 0600  APTT 36 33 31  INR 1.43 1.39 1.29    Sepsis Markers No results for input(s): LATICACIDVEN, PROCALCITON, O2SATVEN in the last 168 hours.  ABG No results for input(s): PHART, PCO2ART, PO2ART in the last 168 hours.  Liver Enzymes  Recent Labs Lab 01/16/16 0418 01/17/16 0600 01/18/16 0418 01/19/16 0345  AST 219* 133*  --   --   ALT 241* 190*  --   --   ALKPHOS 257* 293*  --   --   BILITOT 1.1 1.4*  --   --   ALBUMIN 1.9*  2.0* 2.0*  1.9* 1.8* 1.8*    Cardiac Enzymes No results for input(s): TROPONINI, PROBNP in the last 168 hours.  Glucose  Recent Labs Lab 01/18/16 1457 01/18/16 2013 01/19/16 0032 01/19/16 0355 01/19/16 0741 01/19/16 1132  GLUCAP 171* 206* 199* 183* 165* 110*    Imaging Dg Abd Portable 1v  Result Date: 01/19/2016 CLINICAL DATA:  Feeding tube placement. EXAM: PORTABLE ABDOMEN - 1 VIEW COMPARISON:  01/13/2016 FINDINGS: Weighted feeding tube is been placed with the tip located roughly at the level of the mid stomach. Visualized upper abdominal bowel gas pattern is unremarkable. IMPRESSION: Feeding tube tip  is roughly at the level of the mid stomach. Electronically Signed   By: Aletta Edouard M.D.   On: 01/19/2016 11:47     STUDIES:  CTA Chest 7/25:  b/l GGO and interlobular septal thickening, bibasilar consolidation, mediastinal LAN TTE 7/26: mild LVH, EF 55 to [id number], grade 1 diastolic CHF, mod AS, mod TR, severe LA dilation, PAS 40 mmHg  Port CXR 8/4:  Tracheostomy & R IJ CVL in good position. Enteric tube below diaphragm. Port CXR 8/5: Low lung volumes with slightly lordotic view. Improving bilateral opacities with decreased silhouetting left hemidiaphragm. The central venous catheter, enteric tube, and tracheostomy in good position. No new opacity  appreciated. CT Abd/Plevis 8/6: diffuse gaseous pattern w/ mild to marked stool burden c/w constipation. Bibasilar airspace disease   MICROBIOLOGY: Respiratory Viral Panel PCR 7/15:  Negative  Blood Ctx x4 7/24: 1/4 Coag Neg Staph Tracheal Asp Ctx 7/24:  Oral [place] Respiratory Viral Panel PCR 7/24:  Negative  Tracheal Asp Ctx 7/27: Normal respiratory flora Blood Ctx 7/27:  Negative  Ear Viral Ctx 8/1 >> Blood Ctx x2 8/5 >> Trach Asp Ctx 8/9 >>  ANTIBIOTICS: Aztreonam 7/24 - 7/25 Imipenem 7/24 - 7/25 Vancomycin 7/24 - 7/25 Levaquin 7/25 - 8/1 Linezolid 7/25 - 8/4 Meropenem 7/25 - 8/5 Anidulofungin 7/27 - 8/7  SIGNIFICANT EVENTS: 7/22 - discharge to SNF 7/24 - Admit, intubated for respiratory failure  7/25 - worsening acidosis, off pressors, ARDS protocol, CVVHD initiated 7/26 - Nimbex discontinued  7/30 - hypotension with sedation, vasopressors restarted, ST segment changes  7/31 - vomiting overnight, NGT to suction 8/02 - off pressors 8/03 - trach 8/04 - SVT vs Sinus Tach w/ PVCs along w/ new fever 8/07 - abd pain; CT showing constipation. Repeating SMOG  LINES/TUBES: ETT 8.5 7/24 - 8/3 L Subclavian CVL 7/25 - 8/3 pulled out by pt. R IJ HD CVL 7/25 - 8/7 Trach #8 8/3 (DF)>> R Nare NGT 8/3>> PIV x1  ASSESSMENT / PLAN:  PULMONARY A: Acute Hypoxic Respiratory Failure - ARDS w/ HCAP. 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 H/O OSA.  P:   Continue Tracheostomy Collar q4hr on with Full Vent Support for rest for 4 hours Continue Solu-Medrol 40mg  IV daily for now Brovana & Budesonide neb q12hr Intermittent CXR  CARDIOVASCULAR A:  Abnormal EKG - ST elevation RCA distribution, resolved spontaneously without hemodynamic change, uncertain etiology. Seen on 7/28 & again 7/30. Suspect demand with hypotension in setting of moderate aortic stenosis. Shock - Likely sedation & CVVHD related. Resolved. H/O CAD,  Carotid artery disease, chronic diastolic CHF, HTN, a fib s/p ablation, HLD, mod AS.  P:  Monitoring on telemetry Vitals per unit protocol  Hold ASA with concern about gastritis Holding heparin gtt  Lopressor VT bid  RENAL A:   Acute on Chronic Renal Failure Stage 3 - Off CVVHD 8/3. Improving. Hypomagnesemia - Resolved. Hypernatremia - Overall stable. Hyperchloremia - Overall stable.  P:   Trending UOP Monitoring electrolytes & renal function daily Restart Free Water 200cc VT q6hr Continue 1/2 NS @ 75cc/hr  GASTROINTESTINAL A:   Abdominal Pain w/ Ileus vs Constipation - Improved after SMOG enema 8/4.  Still w/ sig residual constipation 8/7.  Got one dose of Relistor. Improved after another SMOG enema 8/7. Gastritis.  P:   NPO Continuing Trickle Tube Feedings Pepcid IV q24hr Senna bid VT  HEMATOLOGIC A:   Coagulopathy - Secondary to Coumadin. Resolved. Thrombocytopenia - Mild. Stable/improved  Anemia - Likely chronic illness. Hgb stable. No signs of active bleeding. Leukocytosis - Mild worsening. Likely reactive & steroids.  P:  Trending cell counts & INR daily Heparin [place] for DVT prophylaxis  Heparin gtt held  SCDs Transfuse for Hgb <8.0 or signs of active bleeding  INFECTIOUS A:   HCAP - S/P Tx. Staph in blood culture - likely contaminate. Candidal scrotal rash. Pressure Ulcer R Ear FUO - UA unimpressive & Blood Cultures obtained  P:   Completed course of Abx per ID Plan to re-culture for fever  ENDOCRINE A:   DM type II - Glucose controlled.  P:   Accu-Checks q4hr SSI per Moderate Algorithm  NEUROLOGIC A:   Acute Encephalopathy - Multifactorial from hypoxia and metabolic. Improving.  P:   RASS goal:  0 Limiting sedating medications  DERMATOLOGICAL A: Sacral Pressure Ulcer R Ear Ulcer Groin/Skin Fold Rash  P: Wound care per WOC recs: sacral dressing change q 3, Interdry Ag to skin folds q5d  FAMILY UPDATE: No family at bedside  8/9.  TODAY'S SUMMARY:  75 year old male with baseline RA and COPD and a recent admission for acute respiratory failure with hypoxemia was admitted on 7/24 with ARDS most likely due to HCAP but there has been question of underlying ILD related to his rheumatoid arthritis.  Has multi-organ failure with improving renal function. Continuing tracheostomy collar and bowel regimen. Plan to transfer to [facility] today for further rehab.  I have spent a total of 31 minutes of critical care time today caring for the patient and reviewing the patient's electronic medical record.  Sonia Baller Ashok Cordia, M.D. [facility] Pager:  [phone] After 3pm or if no response, call [phone] 2:37 PM 01/19/16

## 2016-01-19 NOTE — Progress Notes (Signed)
Report given to receiving RN Katharine Look at [facility]. Patient family updated on status of transfer [facility].   1420- Report given to [facility], [place]. All of patient belongings transported with patient. Patient daughter present at bedside during transfer.

## 2016-01-19 NOTE — Progress Notes (Signed)
Sputum sample obtained and sent to main lab without complications.  Sutures also removed, per MD, and trach site cleaned without complications.  Will continue to monitor.

## 2016-01-20 ENCOUNTER — Other Ambulatory Visit (HOSPITAL_COMMUNITY): Payer: Self-pay

## 2016-01-20 DIAGNOSIS — Z0189 Encounter for other specified special examinations: Secondary | ICD-10-CM

## 2016-01-20 DIAGNOSIS — Z4659 Encounter for fitting and adjustment of other gastrointestinal appliance and device: Secondary | ICD-10-CM

## 2016-01-20 LAB — T4, FREE: FREE T4: 1.37 ng/dL — AB (ref 0.61–1.12)

## 2016-01-20 LAB — CBC WITH DIFFERENTIAL/PLATELET
Basophils Absolute: 0 10*3/uL (ref 0.0–0.1)
Basophils Relative: 0 %
EOS ABS: 0 10*3/uL (ref 0.0–0.7)
Eosinophils Relative: 0 %
HEMATOCRIT: 32.8 % — AB (ref 39.0–52.0)
HEMOGLOBIN: 10 g/dL — AB (ref 13.0–17.0)
LYMPHS ABS: 0.7 10*3/uL (ref 0.7–4.0)
LYMPHS PCT: 5 %
MCH: 29.4 pg (ref 26.0–34.0)
MCHC: 30.5 g/dL (ref 30.0–36.0)
MCV: 96.5 fL (ref 78.0–100.0)
Monocytes Absolute: 0.3 10*3/uL (ref 0.1–1.0)
Monocytes Relative: 2 %
NEUTROS ABS: 14.1 10*3/uL — AB (ref 1.7–7.7)
NEUTROS PCT: 93 %
Platelets: 149 10*3/uL — ABNORMAL LOW (ref 150–400)
RBC: 3.4 MIL/uL — AB (ref 4.22–5.81)
RDW: 19.6 % — ABNORMAL HIGH (ref 11.5–15.5)
WBC: 15.2 10*3/uL — AB (ref 4.0–10.5)

## 2016-01-20 LAB — COMPREHENSIVE METABOLIC PANEL
ALT: 229 U/L — ABNORMAL HIGH (ref 17–63)
ANION GAP: 7 (ref 5–15)
AST: 256 U/L — ABNORMAL HIGH (ref 15–41)
Albumin: 1.9 g/dL — ABNORMAL LOW (ref 3.5–5.0)
Alkaline Phosphatase: 685 U/L — ABNORMAL HIGH (ref 38–126)
BUN: 39 mg/dL — ABNORMAL HIGH (ref 6–20)
CHLORIDE: 117 mmol/L — AB (ref 101–111)
CO2: 28 mmol/L (ref 22–32)
CREATININE: 1.39 mg/dL — AB (ref 0.61–1.24)
Calcium: 8.8 mg/dL — ABNORMAL LOW (ref 8.9–10.3)
GFR, EST AFRICAN AMERICAN: 55 mL/min — AB (ref >60)
GFR, EST NON AFRICAN AMERICAN: 48 mL/min — AB (ref >60)
Glucose, Bld: 148 mg/dL — ABNORMAL HIGH (ref 65–99)
POTASSIUM: 3.9 mmol/L (ref 3.5–5.1)
SODIUM: 152 mmol/L — AB (ref 135–145)
Total Bilirubin: 1.5 mg/dL — ABNORMAL HIGH (ref 0.3–1.2)
Total Protein: 5.2 g/dL — ABNORMAL LOW (ref 6.5–8.1)

## 2016-01-20 LAB — PROTIME-INR
INR: 1.15
Prothrombin Time: 14.7 seconds (ref 11.4–15.2)

## 2016-01-20 LAB — VIRUS CULTURE

## 2016-01-20 LAB — PHOSPHORUS: Phosphorus: 2.7 mg/dL (ref 2.5–4.6)

## 2016-01-20 LAB — PROCALCITONIN: Procalcitonin: 0.96 ng/mL

## 2016-01-20 LAB — TSH: TSH: 0.238 u[IU]/mL — AB (ref 0.350–4.500)

## 2016-01-20 LAB — MAGNESIUM: MAGNESIUM: 1.5 mg/dL — AB (ref 1.7–2.4)

## 2016-01-20 NOTE — Progress Notes (Addendum)
Name: Curtis Frazier MRN: [id number] DOB: [birth year]    ADMISSION DATE:  01/19/2016 CONSULTATION DATE:  8/9   REFERRING MD :  Dr. Laren Everts  CHIEF COMPLAINT:  Trach dependent   HISTORY OF PRESENT ILLNESS:    75 year old male with baseline RA and COPD and a recent admission for acute respiratory failure with hypoxemia.  He was admitted on 7/24 with ARDS most likely due to HCAP but there has been question of underlying ILD related to his rheumatoid arthritis. He was intubated for respiratory failure & septic shock and treated in usual fashion with volume resuscitation, vaso-active gtts, empiric ABX and supportive care. He went on to require ARDS protocol and neuromuscular blockade. He required CRRT for refractory acidosis. Sedation caused hypotension requiring vasopressor use. He was eventually weaned from high-PEEP/fio2 and pressors, but he could not be weaned from the ventilatory requiring tracheostomy 8/3. Since tracheostomy he has been weaning well and had been tolerating ATC during waking hours. 8/9 he was transferred to [facility] for [facility] rehab. Pulmonary has been asked to see.   SIGNIFICANT EVENTS  8/9 transfer to [facility]. At time of transfer we had resumed tube feeds, he was awake and interactive on ATC. He had pulled out his NGT the night prior & had purulent colored sputum so respiratory culture sent 8/10 follow up pulmonary rounds. Lethargic, stopped morphine and oxycodone. Prelim sputum growing GNR-->started on maxipime. Family very concerned. ABG sent  STUDIES:    SUBJECTIVE:  Lethargic difficult to arouse   VITAL SIGNS: 98.6 hr 83 rr 20 114/50 sats 99% (ATC)  PHYSICAL EXAMINATION: General:  Male of normal body habitus now on ATC he is lethargic today Neuro:  Difficult to arouse. Not interactive today HEENT:  [place]/AT, #6 cuffed trach in place Cardiovascular:  RRR, no MRG Lungs:  Coarse bases, mild/mod secretions improved. No accessory use  Abdomen:  Soft,  non-tender, non-distended Musculoskeletal:  No acute deformity  Skin:  Grossly intact   Recent Labs Lab 01/18/16 0418 01/19/16 0345 01/20/16 0540  NA 149* 147* 152*  K 4.2 4.0 3.9  CL 115* 116* 117*  CO2 28 27 28   BUN 60* 49* 39*  CREATININE 1.64* 1.38* 1.39*  GLUCOSE 147* 197* 148*    Recent Labs Lab 01/18/16 0418 01/19/16 0345 01/20/16 0540  HGB 9.4* 9.0* 10.0*  HCT 30.2* 28.5* 32.8*  WBC 19.0* 12.6* 15.2*  PLT 123* 119* 149*   Dg Chest Port 1 View  Result Date: 01/19/2016 CLINICAL DATA:  Respiratory failure, history of chronic diastolic congestive heart failure EXAM: PORTABLE CHEST 1 VIEW COMPARISON:  01/15/2016 FINDINGS: Cardiomediastinal silhouette is stable. There is NG feeding tube with tip in mid stomach. Stable tracheostomy tube position. Metallic fixation rods noted lower thoracic and lumbar spine. There is mild interstitial prominence bilaterally suspicious for mild interstitial edema. No segmental infiltrates. Stable left basilar atelectasis or scarring. IMPRESSION: Stable tracheostomy tube position. NG feeding tube with tip in mid stomach. Mild interstitial prominence bilateral suspicious for mild interstitial edema. No segmental infiltrate. Electronically Signed   By: Lahoma Crocker M.D.   On: 01/19/2016 16:18   Dg Abd Portable 1v  Result Date: 01/20/2016 CLINICAL DATA:  NG tube placement. EXAM: PORTABLE ABDOMEN - 1 VIEW COMPARISON:  Film earlier this day FINDINGS: An NG tube is identified with tip overlying the mid stomach. No other changes identified. IMPRESSION: NG tube with tip overlying the mid stomach. Electronically Signed   By: Margarette Canada M.D.   On:  01/20/2016 01:58   Dg Abd Portable 1v  Result Date: 01/20/2016 CLINICAL DATA:  Nasogastric tube placement.  Initial encounter. EXAM: PORTABLE ABDOMEN - 1 VIEW COMPARISON:  Abdominal radiograph performed earlier today at 3:52 p.m. FINDINGS: The patient's enteric tube is seen ending overlying the body of the stomach.  The visualized bowel gas pattern is grossly unremarkable, though not well assessed due to motion artifact. Thoracolumbar spinal fusion hardware is noted. No acute osseous abnormalities are seen. IMPRESSION: Enteric tube noted ending overlying the body of the stomach. Electronically Signed   By: Garald Balding M.D.   On: 01/20/2016 00:13   Dg Abd Portable 1v  Result Date: 01/19/2016 CLINICAL DATA:  NG tube placement, history of gastritis EXAM: PORTABLE ABDOMEN - 1 VIEW COMPARISON:  None. FINDINGS: There is normal small bowel gas pattern. There is NG feeding tube with tip in mid stomach. IMPRESSION: NG feeding tube with tip in mid stomach. Normal small bowel gas pattern. Electronically Signed   By: Lahoma Crocker M.D.   On: 01/19/2016 16:19   Dg Abd Portable 1v  Result Date: 01/19/2016 CLINICAL DATA:  Feeding tube placement. EXAM: PORTABLE ABDOMEN - 1 VIEW COMPARISON:  01/13/2016 FINDINGS: Weighted feeding tube is been placed with the tip located roughly at the level of the mid stomach. Visualized upper abdominal bowel gas pattern is unremarkable. IMPRESSION: Feeding tube tip is roughly at the level of the mid stomach. Electronically Signed   By: Aletta Edouard M.D.   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 H/O COPD with emphysema. H/O OSA. Plan Wean from vent entirely over next 2-3 days, with goal to wean to 16, 20, 24 hours ATC over the next 3 days.  rest on vent fully tonight.  Prednisone 40 q/d then slow taper over 4 weeks Brovana & Budesonide neb q12hr Intermittent CXR Follow 8/9 sputum culture  Acute Encephalopathy-->worse as of 8/10. Think this may be medication induced (started on oxy and morphine on admission to [facility]).  Plan Check abg Dc morphine and oxy  H/o afib Plan Monitoring on telemetry Vitals per unit protocol  Hold ASA with concern about gastritis Holding heparin gtt &  coumadin. At some point this can be re-addressed-->would consider repeat FOB first.  Lopressor VT bid  Worsening Hypernatremia  Plan Free water adjusted Would follow chemistry closely   severe deconditioning Plan Continue PT and OT services  Acute on Chronic Renal Failure Stage 3 - Off CVVHD 8/3. Stable. Plan Trend chemistry Avoid volume depletion w/ recovering renal fxn   Severe Protein calorie malnutrition Critical illness associated Dysphagia  Gastritis. Plan: NPO-->will need SLP eval for swallowing assessment Tubefeeds w/ dietary follow up to ensure meeting nutritional needs   Thrombocytopenia - Mild. Stable/improved Anemia - Likely chronic illness. Hgb stable. No signs of active bleeding. Plan Trending cell counts & INR daily Heparin [place] for DVT prophylaxis  SCDs Transfuse for Hgb <8.0 or signs of active bleeding  Sacral Pressure Ulcer R Ear Ulcer Groin/Skin Fold Rash Plan: Wound care per WOC recs: sacral dressing change q 3, Interdry Ag to skin folds q5d Would recommend Wound nurse follow up at [place] [facility] [place] Pager # [phone] OR # [phone] if no answer   STAFF NOTE: I, Merrie Roof, MD FACP have personally reviewed patient's available data, including medical history, events of note, physical examination and test results as part of my  evaluation. I have discussed with resident/NP and other care providers such as pharmacist, RN and RRT. In addition, I personally evaluated patient and elicited key findings of: more lethargic today after narcs, coarse BS, no distress on TC, secretions absent, Na rising, pcxr unchanged, would correct Na slowly, repeat na, Trach collar x 16 hrs then goal 24 hours as goals, I noted cefepime started, unsure if he has a new infection, would get pct and dc abx if clinically he is doing well, He is progressing well off vent so unclear if this is a pathogen, keep pos balance, abg is  reasonable on TC with changes today neuro wise, want cuff down, PMV attempts, I updated son and daughter in full in room, d/w PCP  [name]. [name], MD, [place] Pgr: [place] [facility] 01/20/2016 4:43 PM

## 2016-01-21 ENCOUNTER — Inpatient Hospital Stay (HOSPITAL_COMMUNITY)
Admission: AD | Admit: 2016-01-21 | Discharge: 2016-02-11 | DRG: 870 | Disposition: E | Payer: Medicare Other | Source: Ambulatory Visit | Attending: Emergency Medicine | Admitting: Emergency Medicine

## 2016-01-21 ENCOUNTER — Inpatient Hospital Stay (HOSPITAL_COMMUNITY): Payer: Medicare Other

## 2016-01-21 ENCOUNTER — Other Ambulatory Visit (HOSPITAL_COMMUNITY): Payer: Self-pay

## 2016-01-21 DIAGNOSIS — J9311 Primary spontaneous pneumothorax: Secondary | ICD-10-CM | POA: Diagnosis not present

## 2016-01-21 DIAGNOSIS — N170 Acute kidney failure with tubular necrosis: Secondary | ICD-10-CM | POA: Diagnosis not present

## 2016-01-21 DIAGNOSIS — B9562 Methicillin resistant Staphylococcus aureus infection as the cause of diseases classified elsewhere: Secondary | ICD-10-CM | POA: Diagnosis not present

## 2016-01-21 DIAGNOSIS — R7881 Bacteremia: Secondary | ICD-10-CM | POA: Diagnosis not present

## 2016-01-21 DIAGNOSIS — I480 Paroxysmal atrial fibrillation: Secondary | ICD-10-CM | POA: Diagnosis present

## 2016-01-21 DIAGNOSIS — J439 Emphysema, unspecified: Secondary | ICD-10-CM | POA: Diagnosis present

## 2016-01-21 DIAGNOSIS — K219 Gastro-esophageal reflux disease without esophagitis: Secondary | ICD-10-CM | POA: Diagnosis present

## 2016-01-21 DIAGNOSIS — I130 Hypertensive heart and chronic kidney disease with heart failure and stage 1 through stage 4 chronic kidney disease, or unspecified chronic kidney disease: Secondary | ICD-10-CM | POA: Diagnosis present

## 2016-01-21 DIAGNOSIS — Z6828 Body mass index (BMI) 28.0-28.9, adult: Secondary | ICD-10-CM

## 2016-01-21 DIAGNOSIS — Z515 Encounter for palliative care: Secondary | ICD-10-CM | POA: Diagnosis not present

## 2016-01-21 DIAGNOSIS — J189 Pneumonia, unspecified organism: Secondary | ICD-10-CM | POA: Diagnosis present

## 2016-01-21 DIAGNOSIS — E43 Unspecified severe protein-calorie malnutrition: Secondary | ICD-10-CM | POA: Diagnosis present

## 2016-01-21 DIAGNOSIS — I2510 Atherosclerotic heart disease of native coronary artery without angina pectoris: Secondary | ICD-10-CM | POA: Diagnosis present

## 2016-01-21 DIAGNOSIS — I350 Nonrheumatic aortic (valve) stenosis: Secondary | ICD-10-CM | POA: Diagnosis present

## 2016-01-21 DIAGNOSIS — J939 Pneumothorax, unspecified: Secondary | ICD-10-CM | POA: Diagnosis not present

## 2016-01-21 DIAGNOSIS — E870 Hyperosmolality and hypernatremia: Secondary | ICD-10-CM | POA: Diagnosis present

## 2016-01-21 DIAGNOSIS — Z981 Arthrodesis status: Secondary | ICD-10-CM

## 2016-01-21 DIAGNOSIS — Z930 Tracheostomy status: Secondary | ICD-10-CM

## 2016-01-21 DIAGNOSIS — F419 Anxiety disorder, unspecified: Secondary | ICD-10-CM | POA: Diagnosis present

## 2016-01-21 DIAGNOSIS — A498 Other bacterial infections of unspecified site: Secondary | ICD-10-CM | POA: Diagnosis not present

## 2016-01-21 DIAGNOSIS — E872 Acidosis: Secondary | ICD-10-CM | POA: Diagnosis present

## 2016-01-21 DIAGNOSIS — Z9689 Presence of other specified functional implants: Secondary | ICD-10-CM

## 2016-01-21 DIAGNOSIS — J9601 Acute respiratory failure with hypoxia: Secondary | ICD-10-CM | POA: Diagnosis not present

## 2016-01-21 DIAGNOSIS — E785 Hyperlipidemia, unspecified: Secondary | ICD-10-CM | POA: Diagnosis present

## 2016-01-21 DIAGNOSIS — Z6822 Body mass index (BMI) 22.0-22.9, adult: Secondary | ICD-10-CM | POA: Diagnosis not present

## 2016-01-21 DIAGNOSIS — R52 Pain, unspecified: Secondary | ICD-10-CM

## 2016-01-21 DIAGNOSIS — R627 Adult failure to thrive: Secondary | ICD-10-CM | POA: Diagnosis present

## 2016-01-21 DIAGNOSIS — Z86010 Personal history of colonic polyps: Secondary | ICD-10-CM

## 2016-01-21 DIAGNOSIS — J95851 Ventilator associated pneumonia: Secondary | ICD-10-CM | POA: Diagnosis not present

## 2016-01-21 DIAGNOSIS — R6521 Severe sepsis with septic shock: Secondary | ICD-10-CM | POA: Diagnosis present

## 2016-01-21 DIAGNOSIS — A4102 Sepsis due to Methicillin resistant Staphylococcus aureus: Secondary | ICD-10-CM | POA: Diagnosis present

## 2016-01-21 DIAGNOSIS — I5032 Chronic diastolic (congestive) heart failure: Secondary | ICD-10-CM | POA: Diagnosis present

## 2016-01-21 DIAGNOSIS — L8990 Pressure ulcer of unspecified site, unspecified stage: Secondary | ICD-10-CM | POA: Insufficient documentation

## 2016-01-21 DIAGNOSIS — Z7952 Long term (current) use of systemic steroids: Secondary | ICD-10-CM

## 2016-01-21 DIAGNOSIS — Z955 Presence of coronary angioplasty implant and graft: Secondary | ICD-10-CM

## 2016-01-21 DIAGNOSIS — I248 Other forms of acute ischemic heart disease: Secondary | ICD-10-CM | POA: Diagnosis present

## 2016-01-21 DIAGNOSIS — Z85828 Personal history of other malignant neoplasm of skin: Secondary | ICD-10-CM

## 2016-01-21 DIAGNOSIS — I252 Old myocardial infarction: Secondary | ICD-10-CM

## 2016-01-21 DIAGNOSIS — Y95 Nosocomial condition: Secondary | ICD-10-CM | POA: Diagnosis present

## 2016-01-21 DIAGNOSIS — Z978 Presence of other specified devices: Secondary | ICD-10-CM

## 2016-01-21 DIAGNOSIS — R109 Unspecified abdominal pain: Secondary | ICD-10-CM

## 2016-01-21 DIAGNOSIS — E1122 Type 2 diabetes mellitus with diabetic chronic kidney disease: Secondary | ICD-10-CM | POA: Diagnosis present

## 2016-01-21 DIAGNOSIS — M353 Polymyalgia rheumatica: Secondary | ICD-10-CM | POA: Diagnosis present

## 2016-01-21 DIAGNOSIS — E1165 Type 2 diabetes mellitus with hyperglycemia: Secondary | ICD-10-CM | POA: Diagnosis present

## 2016-01-21 DIAGNOSIS — J9690 Respiratory failure, unspecified, unspecified whether with hypoxia or hypercapnia: Secondary | ICD-10-CM | POA: Diagnosis present

## 2016-01-21 DIAGNOSIS — J9620 Acute and chronic respiratory failure, unspecified whether with hypoxia or hypercapnia: Secondary | ICD-10-CM | POA: Diagnosis present

## 2016-01-21 DIAGNOSIS — N183 Chronic kidney disease, stage 3 (moderate): Secondary | ICD-10-CM | POA: Diagnosis present

## 2016-01-21 DIAGNOSIS — E2740 Unspecified adrenocortical insufficiency: Secondary | ICD-10-CM | POA: Diagnosis present

## 2016-01-21 DIAGNOSIS — Z79899 Other long term (current) drug therapy: Secondary | ICD-10-CM

## 2016-01-21 DIAGNOSIS — J9602 Acute respiratory failure with hypercapnia: Secondary | ICD-10-CM | POA: Diagnosis not present

## 2016-01-21 DIAGNOSIS — J9600 Acute respiratory failure, unspecified whether with hypoxia or hypercapnia: Secondary | ICD-10-CM | POA: Diagnosis not present

## 2016-01-21 DIAGNOSIS — A4101 Sepsis due to Methicillin susceptible Staphylococcus aureus: Secondary | ICD-10-CM

## 2016-01-21 DIAGNOSIS — D696 Thrombocytopenia, unspecified: Secondary | ICD-10-CM | POA: Diagnosis present

## 2016-01-21 DIAGNOSIS — M069 Rheumatoid arthritis, unspecified: Secondary | ICD-10-CM | POA: Diagnosis present

## 2016-01-21 DIAGNOSIS — R509 Fever, unspecified: Secondary | ICD-10-CM | POA: Diagnosis present

## 2016-01-21 DIAGNOSIS — I4891 Unspecified atrial fibrillation: Secondary | ICD-10-CM | POA: Diagnosis not present

## 2016-01-21 DIAGNOSIS — Z7951 Long term (current) use of inhaled steroids: Secondary | ICD-10-CM

## 2016-01-21 DIAGNOSIS — Z794 Long term (current) use of insulin: Secondary | ICD-10-CM

## 2016-01-21 DIAGNOSIS — J930 Spontaneous tension pneumothorax: Secondary | ICD-10-CM | POA: Diagnosis not present

## 2016-01-21 DIAGNOSIS — Z452 Encounter for adjustment and management of vascular access device: Secondary | ICD-10-CM

## 2016-01-21 DIAGNOSIS — K7200 Acute and subacute hepatic failure without coma: Secondary | ICD-10-CM | POA: Diagnosis present

## 2016-01-21 DIAGNOSIS — Z1624 Resistance to multiple antibiotics: Secondary | ICD-10-CM

## 2016-01-21 DIAGNOSIS — A419 Sepsis, unspecified organism: Secondary | ICD-10-CM | POA: Diagnosis not present

## 2016-01-21 DIAGNOSIS — Z66 Do not resuscitate: Secondary | ICD-10-CM | POA: Diagnosis not present

## 2016-01-21 DIAGNOSIS — J9621 Acute and chronic respiratory failure with hypoxia: Secondary | ICD-10-CM | POA: Diagnosis not present

## 2016-01-21 DIAGNOSIS — Z87891 Personal history of nicotine dependence: Secondary | ICD-10-CM

## 2016-01-21 LAB — CULTURE, BLOOD (ROUTINE X 2)
CULTURE: NO GROWTH
Culture: NO GROWTH

## 2016-01-21 LAB — RENAL FUNCTION PANEL
ANION GAP: 8 (ref 5–15)
Albumin: 1.5 g/dL — ABNORMAL LOW (ref 3.5–5.0)
BUN: 38 mg/dL — AB (ref 6–20)
CHLORIDE: 115 mmol/L — AB (ref 101–111)
CO2: 26 mmol/L (ref 22–32)
Calcium: 8.2 mg/dL — ABNORMAL LOW (ref 8.9–10.3)
Creatinine, Ser: 1.21 mg/dL (ref 0.61–1.24)
GFR calc Af Amer: >60 mL/min (ref >60)
GFR, EST NON AFRICAN AMERICAN: 56 mL/min — AB (ref >60)
GLUCOSE: 258 mg/dL — AB (ref 65–99)
PHOSPHORUS: 2.1 mg/dL — AB (ref 2.5–4.6)
POTASSIUM: 3.5 mmol/L (ref 3.5–5.1)
Sodium: 149 mmol/L — ABNORMAL HIGH (ref 135–145)

## 2016-01-21 LAB — CBC WITH DIFFERENTIAL/PLATELET
BASOS ABS: 0 10*3/uL (ref 0.0–0.1)
Basophils Absolute: 0 10*3/uL (ref 0.0–0.1)
Basophils Relative: 0 %
Basophils Relative: 0 %
EOS PCT: 0 %
Eosinophils Absolute: 0 10*3/uL (ref 0.0–0.7)
Eosinophils Absolute: 0 10*3/uL (ref 0.0–0.7)
Eosinophils Relative: 0 %
HEMATOCRIT: 28.8 % — AB (ref 39.0–52.0)
HEMATOCRIT: 31.7 % — AB (ref 39.0–52.0)
HEMOGLOBIN: 8.9 g/dL — AB (ref 13.0–17.0)
Hemoglobin: 9.5 g/dL — ABNORMAL LOW (ref 13.0–17.0)
LYMPHS ABS: 0.8 10*3/uL (ref 0.7–4.0)
LYMPHS PCT: 6 %
Lymphocytes Relative: 3 %
Lymphs Abs: 0.5 10*3/uL — ABNORMAL LOW (ref 0.7–4.0)
MCH: 29.8 pg (ref 26.0–34.0)
MCH: 30.2 pg (ref 26.0–34.0)
MCHC: 30.9 g/dL (ref 30.0–36.0)
MCHC: 30 g/dL (ref 30.0–36.0)
MCV: 96.3 fL (ref 78.0–100.0)
MCV: 100.6 fL — ABNORMAL HIGH (ref 78.0–100.0)
MONO ABS: 0.3 10*3/uL (ref 0.1–1.0)
MONO ABS: 0.4 10*3/uL (ref 0.1–1.0)
MONOS PCT: 2 %
Monocytes Relative: 2 %
NEUTROS ABS: 12.7 10*3/uL — AB (ref 1.7–7.7)
NEUTROS PCT: 95 %
Neutro Abs: 16.6 10*3/uL — ABNORMAL HIGH (ref 1.7–7.7)
Neutrophils Relative %: 92 %
PLATELETS: 141 10*3/uL — AB (ref 150–400)
Platelets: 124 10*3/uL — ABNORMAL LOW (ref 150–400)
RBC: 2.99 MIL/uL — ABNORMAL LOW (ref 4.22–5.81)
RBC: 3.15 MIL/uL — AB (ref 4.22–5.81)
RDW: 19.6 % — AB (ref 11.5–15.5)
RDW: 20 % — AB (ref 11.5–15.5)
WBC: 13.8 10*3/uL — ABNORMAL HIGH (ref 4.0–10.5)
WBC: 17.5 10*3/uL — AB (ref 4.0–10.5)

## 2016-01-21 LAB — BLOOD GAS, ARTERIAL
ACID-BASE DEFICIT: 1 mmol/L (ref 0.0–2.0)
ACID-BASE DEFICIT: 8.7 mmol/L — AB (ref 0.0–2.0)
ACID-BASE EXCESS: 1 mmol/L (ref 0.0–2.0)
Acid-Base Excess: 3.3 mmol/L — ABNORMAL HIGH (ref 0.0–2.0)
Bicarbonate: 26.7 mEq/L — ABNORMAL HIGH (ref 20.0–24.0)
Bicarbonate: 25.2 mEq/L — ABNORMAL HIGH (ref 20.0–24.0)
Bicarbonate: 25.1 mEq/L — ABNORMAL HIGH (ref 20.0–24.0)
Bicarbonate: 17.8 mEq/L — ABNORMAL LOW (ref 20.0–24.0)
DRAWN BY: 365271
FIO2: 0.28
FIO2: 50
FIO2: 100
FIO2: 100
LHR: 30 {breaths}/min
LHR: 28 {breaths}/min
MECHVT: 500 mL
MECHVT: 500 mL
O2 SAT: 92.1 %
O2 SAT: 84.7 %
O2 SAT: 94.8 %
O2 Saturation: 95.5 %
PATIENT TEMPERATURE: 98.6
PATIENT TEMPERATURE: 98.6
PATIENT TEMPERATURE: 99.7
PCO2 ART: 36.3 mmHg (ref 35.0–45.0)
PCO2 ART: 41.8 mmHg (ref 35.0–45.0)
PCO2 ART: 57.1 mmHg — AB (ref 35.0–45.0)
PCO2 ART: 48 mmHg — AB (ref 35.0–45.0)
PEEP/CPAP: 5 cmH2O
PEEP/CPAP: 10 cmH2O
PEEP: 10 cmH2O
PH ART: 7.398 (ref 7.350–7.450)
PH ART: 7.266 — AB (ref 7.350–7.450)
PH ART: 7.198 — AB (ref 7.350–7.450)
PO2 ART: 78.6 mmHg — AB (ref 80.0–100.0)
PO2 ART: 69.9 mmHg — AB (ref 80.0–100.0)
PO2 ART: 62 mmHg — AB (ref 80.0–100.0)
PO2 ART: 96.6 mmHg (ref 80.0–100.0)
Patient temperature: 98.6
RATE: 20 resp/min
TCO2: 27.8 mmol/L (ref 0–100)
TCO2: 26.5 mmol/L (ref 0–100)
TCO2: 26.9 mmol/L (ref 0–100)
TCO2: 19.2 mmol/L (ref 0–100)
VT: 500 mL
pH, Arterial: 7.478 — ABNORMAL HIGH (ref 7.350–7.450)

## 2016-01-21 LAB — URINALYSIS, ROUTINE W REFLEX MICROSCOPIC
Bilirubin Urine: NEGATIVE
Glucose, UA: 500 mg/dL — AB
Ketones, ur: NEGATIVE mg/dL
LEUKOCYTES UA: NEGATIVE
Nitrite: NEGATIVE
PROTEIN: 30 mg/dL — AB
SPECIFIC GRAVITY, URINE: 1.021 (ref 1.005–1.030)
pH: 6 (ref 5.0–8.0)

## 2016-01-21 LAB — COMPREHENSIVE METABOLIC PANEL
ALBUMIN: 1.5 g/dL — AB (ref 3.5–5.0)
ALBUMIN: 1.4 g/dL — AB (ref 3.5–5.0)
ALK PHOS: 688 U/L — AB (ref 38–126)
ALT: 255 U/L — ABNORMAL HIGH (ref 17–63)
ALT: 230 U/L — AB (ref 17–63)
ALT: 216 U/L — ABNORMAL HIGH (ref 17–63)
ANION GAP: 7 (ref 5–15)
ANION GAP: 5 (ref 5–15)
AST: 215 U/L — AB (ref 15–41)
AST: 156 U/L — ABNORMAL HIGH (ref 15–41)
AST: 148 U/L — ABNORMAL HIGH (ref 15–41)
Albumin: 1.3 g/dL — ABNORMAL LOW (ref 3.5–5.0)
Alkaline Phosphatase: 745 U/L — ABNORMAL HIGH (ref 38–126)
Alkaline Phosphatase: 661 U/L — ABNORMAL HIGH (ref 38–126)
Anion gap: 4 — ABNORMAL LOW (ref 5–15)
BUN: 37 mg/dL — AB (ref 6–20)
BUN: 44 mg/dL — AB (ref 6–20)
BUN: 47 mg/dL — ABNORMAL HIGH (ref 6–20)
CHLORIDE: 120 mmol/L — AB (ref 101–111)
CO2: 25 mmol/L (ref 22–32)
CO2: 24 mmol/L (ref 22–32)
CO2: 21 mmol/L — ABNORMAL LOW (ref 22–32)
CREATININE: 1.86 mg/dL — AB (ref 0.61–1.24)
Calcium: 8.2 mg/dL — ABNORMAL LOW (ref 8.9–10.3)
Calcium: 7.7 mg/dL — ABNORMAL LOW (ref 8.9–10.3)
Calcium: 7.2 mg/dL — ABNORMAL LOW (ref 8.9–10.3)
Chloride: 116 mmol/L — ABNORMAL HIGH (ref 101–111)
Chloride: 119 mmol/L — ABNORMAL HIGH (ref 101–111)
Creatinine, Ser: 1.21 mg/dL (ref 0.61–1.24)
Creatinine, Ser: 1.85 mg/dL — ABNORMAL HIGH (ref 0.61–1.24)
GFR calc Af Amer: >60 mL/min (ref >60)
GFR calc Af Amer: 39 mL/min — ABNORMAL LOW (ref >60)
GFR calc non Af Amer: 56 mL/min — ABNORMAL LOW (ref >60)
GFR calc non Af Amer: 34 mL/min — ABNORMAL LOW (ref >60)
GFR, EST AFRICAN AMERICAN: 39 mL/min — AB (ref >60)
GFR, EST NON AFRICAN AMERICAN: 34 mL/min — AB (ref >60)
GLUCOSE: 259 mg/dL — AB (ref 65–99)
GLUCOSE: 284 mg/dL — AB (ref 65–99)
Glucose, Bld: 320 mg/dL — ABNORMAL HIGH (ref 65–99)
POTASSIUM: 3.4 mmol/L — AB (ref 3.5–5.1)
POTASSIUM: 4.5 mmol/L (ref 3.5–5.1)
Potassium: 3.8 mmol/L (ref 3.5–5.1)
SODIUM: 148 mmol/L — AB (ref 135–145)
SODIUM: 148 mmol/L — AB (ref 135–145)
Sodium: 145 mmol/L (ref 135–145)
TOTAL PROTEIN: 4.2 g/dL — AB (ref 6.5–8.1)
Total Bilirubin: 0.9 mg/dL (ref 0.3–1.2)
Total Bilirubin: 1.1 mg/dL (ref 0.3–1.2)
Total Bilirubin: 1.1 mg/dL (ref 0.3–1.2)
Total Protein: 4.6 g/dL — ABNORMAL LOW (ref 6.5–8.1)
Total Protein: 4.4 g/dL — ABNORMAL LOW (ref 6.5–8.1)

## 2016-01-21 LAB — LACTIC ACID, PLASMA
Lactic Acid, Venous: 2.1 mmol/L (ref 0.5–1.9)
Lactic Acid, Venous: 1.8 mmol/L (ref 0.5–1.9)

## 2016-01-21 LAB — TYPE AND SCREEN
ABO/RH(D): A NEG
Antibody Screen: NEGATIVE

## 2016-01-21 LAB — AMYLASE: AMYLASE: 125 U/L — AB (ref 28–100)

## 2016-01-21 LAB — PHOSPHORUS: PHOSPHORUS: 4 mg/dL (ref 2.5–4.6)

## 2016-01-21 LAB — LIPASE, BLOOD: LIPASE: 96 U/L — AB (ref 11–51)

## 2016-01-21 LAB — URINE MICROSCOPIC-ADD ON

## 2016-01-21 LAB — CBC
HEMATOCRIT: 31.1 % — AB (ref 39.0–52.0)
HEMOGLOBIN: 9.4 g/dL — AB (ref 13.0–17.0)
MCH: 29.8 pg (ref 26.0–34.0)
MCHC: 30.2 g/dL (ref 30.0–36.0)
MCV: 98.7 fL (ref 78.0–100.0)
Platelets: 148 10*3/uL — ABNORMAL LOW (ref 150–400)
RBC: 3.15 MIL/uL — AB (ref 4.22–5.81)
RDW: 20.1 % — ABNORMAL HIGH (ref 11.5–15.5)
WBC: 17.3 10*3/uL — ABNORMAL HIGH (ref 4.0–10.5)

## 2016-01-21 LAB — TROPONIN I
TROPONIN I: 0.11 ng/mL — AB (ref <0.03)
Troponin I: 0.08 ng/mL (ref <0.03)

## 2016-01-21 LAB — MAGNESIUM
MAGNESIUM: 1.8 mg/dL (ref 1.7–2.4)
MAGNESIUM: 1.7 mg/dL (ref 1.7–2.4)

## 2016-01-21 LAB — HEMOGLOBIN A1C
Hgb A1c MFr Bld: 7.9 % — ABNORMAL HIGH (ref 4.8–5.6)
Mean Plasma Glucose: 180 mg/dL

## 2016-01-21 LAB — GRAM STAIN

## 2016-01-21 LAB — PROCALCITONIN
PROCALCITONIN: 0.91 ng/mL
PROCALCITONIN: 6.68 ng/mL
PROCALCITONIN: 28.3 ng/mL

## 2016-01-21 LAB — STREP PNEUMONIAE URINARY ANTIGEN: STREP PNEUMO URINARY ANTIGEN: NEGATIVE

## 2016-01-21 LAB — APTT: aPTT: 31 seconds (ref 24–36)

## 2016-01-21 LAB — PROTIME-INR
INR: 1.24
PROTHROMBIN TIME: 15.7 s — AB (ref 11.4–15.2)

## 2016-01-21 LAB — TSH: TSH: 0.147 u[IU]/mL — ABNORMAL LOW (ref 0.350–4.500)

## 2016-01-21 MED ORDER — SODIUM CHLORIDE 0.9 % IV BOLUS (SEPSIS)
1000.0000 mL | Freq: Once | INTRAVENOUS | Status: AC
  Administered 2016-01-21: 1000 mL via INTRAVENOUS

## 2016-01-21 MED ORDER — IPRATROPIUM-ALBUTEROL 0.5-2.5 (3) MG/3ML IN SOLN
3.0000 mL | Freq: Four times a day (QID) | RESPIRATORY_TRACT | Status: DC
  Administered 2016-01-21 – 2016-01-25 (×16): 3 mL via RESPIRATORY_TRACT
  Filled 2016-01-21 (×15): qty 3

## 2016-01-21 MED ORDER — MIDAZOLAM HCL 2 MG/2ML IJ SOLN
1.0000 mg | INTRAMUSCULAR | Status: DC | PRN
  Administered 2016-01-21: 1 mg via INTRAVENOUS

## 2016-01-21 MED ORDER — LINEZOLID 600 MG/300ML IV SOLN
600.0000 mg | Freq: Two times a day (BID) | INTRAVENOUS | Status: DC
  Administered 2016-01-21 – 2016-01-22 (×2): 600 mg via INTRAVENOUS
  Filled 2016-01-21 (×3): qty 300

## 2016-01-21 MED ORDER — ANTISEPTIC ORAL RINSE SOLUTION (CORINZ)
7.0000 mL | Freq: Four times a day (QID) | OROMUCOSAL | Status: DC
  Administered 2016-01-21 – 2016-01-25 (×14): 7 mL via OROMUCOSAL

## 2016-01-21 MED ORDER — FENTANYL CITRATE (PF) 100 MCG/2ML IJ SOLN: 50.0000 ug | Freq: Once | INTRAMUSCULAR | Status: DC

## 2016-01-21 MED ORDER — PHENYLEPHRINE HCL 10 MG/ML IJ SOLN
0.0000 ug/min | INTRAVENOUS | Status: DC
  Filled 2016-01-21: qty 1

## 2016-01-21 MED ORDER — HEPARIN BOLUS VIA INFUSION
3500.0000 [IU] | Freq: Once | INTRAVENOUS | Status: AC
  Administered 2016-01-21: 3500 [IU] via INTRAVENOUS
  Filled 2016-01-21: qty 3500

## 2016-01-21 MED ORDER — HEPARIN (PORCINE) IN NACL 100-0.45 UNIT/ML-% IJ SOLN
1250.0000 [IU]/h | INTRAMUSCULAR | Status: DC
  Administered 2016-01-21: 1000 [IU]/h via INTRAVENOUS
  Administered 2016-01-22: 1250 [IU]/h via INTRAVENOUS
  Filled 2016-01-21 (×4): qty 250

## 2016-01-21 MED ORDER — ROCURONIUM BROMIDE 50 MG/5ML IV SOLN
65.0000 mg | Freq: Once | INTRAVENOUS | Status: AC
  Administered 2016-01-21: 65 mg via INTRAVENOUS
  Filled 2016-01-21: qty 6.5

## 2016-01-21 MED ORDER — HYDROCORTISONE NA SUCCINATE PF 100 MG IJ SOLR
50.0000 mg | Freq: Four times a day (QID) | INTRAMUSCULAR | Status: DC
  Administered 2016-01-21 – 2016-01-25 (×16): 50 mg via INTRAVENOUS
  Filled 2016-01-21 (×6): qty 1
  Filled 2016-01-21: qty 2
  Filled 2016-01-21 (×3): qty 1
  Filled 2016-01-21: qty 2
  Filled 2016-01-21 (×7): qty 1

## 2016-01-21 MED ORDER — CHLORHEXIDINE GLUCONATE 0.12% ORAL RINSE (MEDLINE KIT)
15.0000 mL | Freq: Two times a day (BID) | OROMUCOSAL | Status: DC
  Administered 2016-01-21 – 2016-01-25 (×8): 15 mL via OROMUCOSAL

## 2016-01-21 MED ORDER — NOREPINEPHRINE BITARTRATE 1 MG/ML IV SOLN
0.0000 ug/min | INTRAVENOUS | Status: DC
  Administered 2016-01-21: 10 ug/min via INTRAVENOUS
  Administered 2016-01-22 (×3): 40 ug/min via INTRAVENOUS
  Administered 2016-01-22: 35 ug/min via INTRAVENOUS
  Administered 2016-01-23: 40 ug/min via INTRAVENOUS
  Administered 2016-01-24: 15 ug/min via INTRAVENOUS
  Administered 2016-01-24: 25 ug/min via INTRAVENOUS
  Administered 2016-01-25: 10 ug/min via INTRAVENOUS
  Filled 2016-01-21 (×10): qty 16

## 2016-01-21 MED ORDER — CHLORHEXIDINE GLUCONATE 0.12% ORAL RINSE (MEDLINE KIT): 15.0000 mL | Freq: Two times a day (BID) | OROMUCOSAL | Status: DC

## 2016-01-21 MED ORDER — SODIUM CHLORIDE 0.9 % IV SOLN
INTRAVENOUS | Status: DC
  Administered 2016-01-21: 17:00:00 via INTRAVENOUS

## 2016-01-21 MED ORDER — MIDAZOLAM HCL 2 MG/2ML IJ SOLN
1.0000 mg | INTRAMUSCULAR | Status: DC | PRN
  Filled 2016-01-21: qty 2

## 2016-01-21 MED ORDER — FENTANYL BOLUS VIA INFUSION
25.0000 ug | INTRAVENOUS | Status: DC | PRN
  Administered 2016-01-21 – 2016-01-23 (×7): 25 ug via INTRAVENOUS
  Filled 2016-01-21: qty 25

## 2016-01-21 MED ORDER — FAMOTIDINE IN NACL 20-0.9 MG/50ML-% IV SOLN
20.0000 mg | Freq: Two times a day (BID) | INTRAVENOUS | Status: DC
  Administered 2016-01-21 – 2016-01-22 (×3): 20 mg via INTRAVENOUS
  Filled 2016-01-21 (×4): qty 50

## 2016-01-21 MED ORDER — MIDAZOLAM HCL 2 MG/2ML IJ SOLN: 1.0000 mg | INTRAMUSCULAR | Status: DC | PRN

## 2016-01-21 MED ORDER — SODIUM CHLORIDE 0.9 % IV SOLN
25.0000 ug/h | INTRAVENOUS | Status: DC
  Administered 2016-01-21 – 2016-01-22 (×2): 50 ug/h via INTRAVENOUS
  Administered 2016-01-23: 175 ug/h via INTRAVENOUS
  Administered 2016-01-24: 60 ug/h via INTRAVENOUS
  Administered 2016-01-24: 200 ug/h via INTRAVENOUS
  Administered 2016-01-24 – 2016-01-26 (×7): 300 ug/h via INTRAVENOUS
  Filled 2016-01-21 (×11): qty 50

## 2016-01-21 MED ORDER — DEXTROSE 5 % IV SOLN
0.0000 ug/min | INTRAVENOUS | Status: DC
  Administered 2016-01-21: 300 ug/min via INTRAVENOUS
  Filled 2016-01-21: qty 4

## 2016-01-21 MED ORDER — ROCURONIUM BROMIDE 50 MG/5ML IV SOLN: 50.0000 mg | INTRAVENOUS | Status: DC | PRN

## 2016-01-21 MED ORDER — SODIUM BICARBONATE 8.4 % IV SOLN
INTRAVENOUS | Status: DC
  Administered 2016-01-21 – 2016-01-25 (×6): via INTRAVENOUS
  Filled 2016-01-21 (×18): qty 150

## 2016-01-21 MED ORDER — VASOPRESSIN 20 UNIT/ML IV SOLN
0.0300 [IU]/min | INTRAVENOUS | Status: DC
  Administered 2016-01-21 – 2016-01-25 (×4): 0.03 [IU]/min via INTRAVENOUS
  Filled 2016-01-21 (×5): qty 2

## 2016-01-21 MED ORDER — ANTISEPTIC ORAL RINSE SOLUTION (CORINZ): 7.0000 mL | Freq: Four times a day (QID) | OROMUCOSAL | Status: DC

## 2016-01-21 MED ORDER — SODIUM CHLORIDE 0.9 % IV SOLN
500.0000 mg | Freq: Three times a day (TID) | INTRAVENOUS | Status: DC
  Filled 2016-01-21 (×2): qty 0.5

## 2016-01-21 MED ORDER — SODIUM CHLORIDE 0.9 % IV SOLN
1.0000 g | Freq: Two times a day (BID) | INTRAVENOUS | Status: DC
  Administered 2016-01-21 – 2016-01-22 (×2): 1 g via INTRAVENOUS
  Filled 2016-01-21 (×3): qty 1

## 2016-01-21 MED FILL — Medication: Qty: 1 | Status: AC

## 2016-01-21 NOTE — H&P (Signed)
PULMONARY / CRITICAL CARE MEDICINE   Name: Curtis Frazier MRN: [id number] DOB: Nov 08, 1940    ADMISSION DATE:  01/19/2016   REFERRING MD: [facility]  CHIEF COMPLAINT:  Acute on chronic resp failure  HISTORY OF PRESENT ILLNESS:   75 yo male with recent admit and dc from [facility] with baseline RA , steroid dependent, developed ARDS and required tracheostomy. Renal function was stage 3 but he did come off CVVH. Transferred to [facility] 8/9 and PCCM consulted on same day. He spent 12 hours on trach collar 8/10 but on 8/11 he was back on full vent support with rr 37 , severe air hunger and anxiety. FOB per Dr. Rosina Lowenstein with rt lower lobe with copious thick white secretions. Due ti his decline aand family wishes he will be transferred to [facility] and to ICU and treated for suspected HCAP.   PAST MEDICAL HISTORY :  He  has a past medical history of Actinic keratosis; Arthritis; Bruises easily; CAD (coronary artery disease); Carotid arterial disease ([place]); Chronic back pain; Chronic diastolic CHF (congestive heart failure) (HCC); CKD (chronic kidney disease), stage III; COPD (chronic obstructive pulmonary disease) ([place]) (dx'd 12/10/2015); Degeneration of intervertebral disc, site unspecified; Dyspnea on exertion; Esophageal reflux; Gastritis (12/10); GI bleed; Hemorrhoids; History of blood transfusion (2010); History of colonic polyps; Hyperlipidemia; Hypertensive heart disease; Joint pain; Lumbar stenosis; Moderate aortic stenosis; Nephrolithiasis; Other psoriasis; Paroxysmal atrial fibrillation ([place]); Personal history of colonic polyps; PMR (polymyalgia rheumatica) ([place]) (01/20/2012); Pneumonia (2008; 07/2015); Psoriasis; Psoriatic arthropathy ([place]); Rheumatoid arthritis(714.0); Scoliosis (and kyphoscoliosis), idiopathic; Skin cancer; Sleep apnea; STEMI (ST elevation myocardial infarction) ([place]) (02/2010); and Type II diabetes mellitus ([place]).  PAST SURGICAL HISTORY: He  has a past surgical history that includes Shoulder open rotator  cuff repair (Left, 2000); Colonoscopy; Vasectomy (1979); carotid doppler (10/12); Cataract extraction w/ intraocular lens  implant, bilateral (Bilateral, 2012); Lithotripsy (2009); TEE without cardioversion (N/A, 08/06/2015); Cardioversion (N/A, 08/06/2015); Back surgery; Lumbar laminectomy (07/2015); Posterior laminectomy thoracic spine; Skin cancer excision; TEE without cardioversion (N/A, 09/14/2015); Cardioversion (N/A, 09/14/2015); TEE without cardioversion (N/A, 11/04/2015); Cardioversion (N/A, 11/04/2015); Cardiac catheterization (N/A, 12/15/2015); Coronary angioplasty with stent (02/2009); Cardiac catheterization (2010); and Laminectomy thoracic fusion posterior (06/2011).  Allergies  Allergen Reactions  . Ace Inhibitors Other (See Comments)    Causes high K   . Angiotensin Receptor Blockers Other (See Comments)    Causes high K   . Metoprolol Other (See Comments)    May cause elevated K level   . Penicillins Swelling and Rash    Has patient had a PCN reaction causing immediate rash, facial/tongue/throat swelling, SOB or lightheadedness with hypotension: YES Has patient had a PCN reaction causing severe rash involving mucus membranes or skin necrosis: NO Has patient had a PCN reaction that required hospitalization NO Has patient had a PCN reaction occurring within the last 10 years: NO If all of the above answers are "NO", then may proceed with Cephalosporin use. Pt has tolerated cephalosporins in the past   . Amiodarone Other (See Comments)    12/27/15: Cardiologist discontinued the amiodarone (on PTA) due to  concern for toxicity  (in hospitial with acute hypoxemic respiratory failure)   . Tetracycline Swelling and Rash    No current facility-administered medications on file prior to encounter.    Current Outpatient Prescriptions on File Prior to Encounter  Medication Sig  . acetaminophen (TYLENOL) 160 MG/5ML solution Place 20.3 mLs (650 mg total) into feeding tube every 6 (six) hours as  needed for fever.  Marland Kitchen  albuterol (PROVENTIL) (2.5 MG/3ML) 0.083% nebulizer solution Take 3 mLs (2.5 mg total) by nebulization every 2 (two) hours as needed for wheezing.  Marland Kitchen arformoterol (BROVANA) 15 MCG/2ML NEBU Take 2 mLs (15 mcg total) by nebulization 2 (two) times daily.  . budesonide (PULMICORT) 0.5 MG/2ML nebulizer solution Take 2 mLs (0.5 mg total) by nebulization 2 (two) times daily.  . calcium-vitamin D (OSCAL WITH D) 500-200 MG-UNIT tablet Take 1 tablet by mouth daily with breakfast.  . docusate sodium (COLACE) 100 MG capsule Take 100 mg by mouth 2 (two) times daily. Reported on 09/22/2015  . glucose blood (ONE TOUCH ULTRA TEST) test strip Check blood sugar three times daily and as directed. Dx E11.8 diabetes that is poorly controlled  . heparin 5000 UNIT/ML injection Inject 1 mL (5,000 Units total) into the skin every 8 (eight) hours.  . insulin aspart (NOVOLOG) 100 UNIT/ML injection Inject 0-15 Units into the skin every 4 (four) hours.  . metoprolol tartrate (LOPRESSOR) 25 mg/10 mL SUSP Place 5 mLs (12.5 mg total) into feeding tube 2 (two) times daily.  Marland Kitchen nystatin (MYCOSTATIN/NYSTOP) powder Apply topically 3 (three) times daily.  . ondansetron (ZOFRAN) 4 MG/2ML SOLN injection Inject 2 mLs (4 mg total) into the vein every 6 (six) hours as needed for nausea or vomiting.  . predniSONE 5 MG/ML concentrated solution Place 8 mLs (40 mg total) into feeding tube daily.  . ranitidine (ZANTAC) 150 MG/10ML syrup Place 10 mLs (150 mg total) into feeding tube 2 (two) times daily.  . Water For Irrigation, Sterile (FREE WATER) SOLN Place 200 mLs into feeding tube every 4 (four) hours.    FAMILY HISTORY:  His indicated that his mother is deceased. He indicated that his father is deceased. He indicated that the status of his sister is unknown. He indicated that the status of his neg hx is unknown.    SOCIAL HISTORY: He  reports that he quit smoking about 7 years ago. His smoking use included Cigarettes.  He has a 79.50 pack-year smoking history. He has never used smokeless tobacco. He reports that he drinks alcohol. He reports that he does not use drugs.  REVIEW OF SYSTEMS:   NA  SUBJECTIVE:  Acutely ill  VITAL SIGNS: There were no vitals taken for this visit.  HEMODYNAMICS:    VENTILATOR SETTINGS:    INTAKE / OUTPUT: No intake/output data recorded.  PHYSICAL EXAMINATION:  General:  Very anxious on full vent support Neuro:  Very anxious but follows commands HEENT:  [place]/AT, #6 cuffed trach in place Cardiovascular:  RRR, no MRG Lungs:  Coarse bases, Abd / cw paradoxus, air hungry  Abdomen:  Soft, slight tenderness, non-distended Musculoskeletal:  No acute deformity  Skin:  Grossly intact  LABS:  BMET  Recent Labs Lab 01/19/16 0345 01/20/16 0540 02/08/2016 0550  NA 147* 152* 148*  149*  K 4.0 3.9 3.4*  3.5  CL 116* 117* 116*  115*  CO2 _0 BUN 49* 39* 37*  38*  CREATININE 1.38* 1.39* 1.21  1.21  GLUCOSE 197* 148* 259*  258*    Electrolytes  Recent Labs Lab 01/19/16 0345 01/20/16 0540 01/14/2016 0550  CALCIUM 8.4* 8.8* 8.2*  8.2*  MG 1.7 1.5* 1.8  PHOS 2.8 2.7 2.1*    CBC  Recent Labs Lab 01/19/16 0345 01/20/16 0540 01/23/2016 0550  WBC 12.6* 15.2* 13.8*  HGB 9.0* 10.0* 8.9*  HCT 28.5* 32.8* 28.8*  PLT 119* 149* 124*    Coag's  Recent Labs Lab 01/15/16 0500 01/16/16 0418 01/17/16 0600 01/20/16 0540  APTT 36 33 31  --   INR 1.43 1.39 1.29 1.15    Sepsis Markers  Recent Labs Lab 01/20/16 0540 02/05/2016 0550  PROCALCITON 0.96 0.91    ABG  Recent Labs Lab 01/20/16 1640 01/13/2016 1020  PHART 7.478* 7.398  PCO2ART 36.3 41.8  PO2ART 78.6* 69.9*    Liver Enzymes  Recent Labs Lab 01/17/16 0600  01/19/16 0345 01/20/16 0540 01/16/2016 0550  AST 133*  --   --  256* 215*  ALT 190*  --   --  229* 255*  ALKPHOS 293*  --   --  685* 745*  BILITOT 1.4*  --   --  1.5* 0.9  ALBUMIN 2.0*  1.9*  < > 1.8* 1.9* 1.5*   1.5*  < > = values in this interval not displayed.  Cardiac Enzymes No results for input(s): TROPONINI, PROBNP in the last 168 hours.  Glucose  Recent Labs Lab 01/18/16 1457 01/18/16 2013 01/19/16 0032 01/19/16 0355 01/19/16 0741 01/19/16 1132  GLUCAP 171* 206* 199* 183* 165* 110*    Imaging Dg Chest Port 1 View  Result Date: 02/04/2016 CLINICAL DATA:  Respiratory failure. EXAM: PORTABLE CHEST 1 VIEW COMPARISON:  01/19/2016. FINDINGS: Interim removal of feeding tube and replacement with NG tube. NG tube tip below left hemidiaphragm. Tracheostomy tube in stable position. Persistent pulmonary interstitial prominence suggesting congestive heart failure. Similar findings noted on prior exam. Low lung volumes with progressive bibasilar atelectasis. No pleural effusion or pneumothorax. IMPRESSION: 1. Interim removal of feeding tube and replacement with NG tube . Tracheostomy tube 2. Cardiomegaly with diffuse bilateral pulmonary interstitial prominence again noted. Findings suggest congestive heart failure. Similar findings noted on prior exam. 3.  Low lung volumes with progressive bibasilar atelectasis Electronically Signed   By: Marcello Moores  Register   On: 02/02/2016 07:40     STUDIES:  8/11 2 d>> 8/11 Abd US>>  CULTURES: 8/9 sptum GNR>> 8/11 bal>> 8/11 bc x 2>> 8/11 uc>> 8/11 procal>>  ANTIBIOTICS: 8/11 zyvox>> 8/11 meropenem >>  SIGNIFICANT EVENTS: 8/11 transferred from [facility] to [facility]  LINES/TUBES: Trach 8/6>>  DISCUSSION: 75 yo male with recent admit and dc from [facility] with baseline RA , steroid dependent, developed ARDS and required tracheostomy. Renal function was stage 3 but he did come off CVVH. Transferred to [facility] 8/9 and PCCM consulted on same day. He spent 12 hours on trach collar 8/10 but on 8/11 he was back on full vent support with rr 37 , severe air hunger and anxiety. FOB per Dr. Rosina Lowenstein with rt lower lobe with copious thick white secretions. Due ti his decline aand  family wishes he will be transferred to [facility] and to ICU and treated for suspected HCAP.   ASSESSMENT / PLAN:  PULMONARY A: Severe bullous emphysema Trached 8/3 COPD PNA   P:   See vent bundle. Peep to 10 , O2 as needed. Sedated as needed Pulmonary toilet Mucomyst  Chest PT May need FU FOB  CARDIOVASCULAR A:  CAD Possible sepsis CHF Mod AS P:  Check pro calcitonin  Check Lactic acid Cycle CE 2D echo May need pressor support May need CVL   RENAL Lab Results  Component Value Date   CREATININE 1.21 02/05/2016   CREATININE 1.21 01/15/2016   CREATININE 1.39 (H) 01/20/2016   CREATININE 1.70 (H) 12/08/2015    Recent Labs Lab 01/19/16 0345 01/20/16 0540 01/16/2016 0550  NA 147* 152* 148*  149*  A:   CKD stage 3 Recent CVVH stopped 8/3 Hypernatremia P:   Avoid nephrotoxins Follow creatine  GASTROINTESTINAL A:   Elevated LFT P:   Hold tube feeds Check [place] of abd  HEMATOLOGIC A:   Anticoagulation for Hx of Afib,coumadin currently on hold P:  ? anticoagulation Follow platelets on Zyvox  INFECTIOUS A:   Presumed HAP P:   [facility] 8[facility]11 Start Zyflo and meropenem 8/11 and pan culture. GNR in sputum from 8/9  ENDOCRINE A:   No acute issues P:   Monitor glucose Stress steroids due to recent steroids  On steroids therefore will not check cortisol  NEUROLOGIC A:   Anxiety in setting of resp ditress P:   RASS goal:0 Fentanyl for vent tolerances   FAMILY  - Updates: Daughter, RN, S Minor ACNP, Dr [name] met and discussed care of Curtis Frazier. He will be transferred  to [facility] ICU for acute decline in resp status, presumed HAP and failure to thrive.  - Inter-disciplinary family meet or Palliative Care meeting due by:  8/18    \Steve Minor ACNP Maryanna Shape PCCM Pager [phone] till 3 pm If no answer page [phone] 01/15/2016, 12:23 PM   STAFF NOTE: I, Merrie Roof, MD FACP have personally reviewed patient's available data,  including medical history, events of note, physical examination and test results as part of my evaluation. I have discussed with resident/NP and other care providers such as pharmacist, RN and RRT. In addition, I personally evaluated patient and elicited key findings of: transfer from [facility], ronchi, rapid declines, pcxr increased rt base/hilar infiltrate, desats with movements, bronch with pus rll, LUL, fever 102,immunosuppressed status, all this is c/w VAP/HCAP, septic shock, with such acute declines and no overt obstructionI also worry about PE, will add empiric heparin for now, get echo for RV, doppler legs, linazolid, carbopenem, high risk MDR, follow bronch bal result, neo add, may need to transition to levophed, was too unstable for line neck earlier, may re assess in [facility] or am, he has bad dyschrony with autpopeep, rate ot 20 when able, sedate and paralaysis intermittent, give stress steoids as pred dependent, deep rass goal, I am concerned about his failure to thrive, I have updated family , daughter multiple times, I have suggested DNR and continued medical support, they will discuss with emaining kids and get back to me, get cbc, stat K, repeat abg, high risk mortality, get pct The patient is critically ill with multiple organ systems failure and requires high complexity decision making for assessment and support, frequent evaluation and titration of therapies, application of advanced monitoring technologies and extensive interpretation of multiple databases.   Critical Care Time devoted to patient care services described in this note is90 Minutes. This time reflects time of care of this signee: Merrie Roof, MD FACP. This critical care time does not reflect procedure time, or teaching time or supervisory time of PA/NP/Med student/Med Resident etc but could involve care discussion time. Rest per NP/medical resident whose note is outlined above and that I agree with   [name].  [name], MD, [place] Pgr: [facility] 01/15/2016 3:21 PM

## 2016-01-21 NOTE — Progress Notes (Signed)
ANTICOAGULATION CONSULT NOTE - Initial Consult  Pharmacy Consult for Heparin  Indication: atrial fibrillation  Allergies  Allergen Reactions  . Ace Inhibitors Other (See Comments)    Causes high K   . Angiotensin Receptor Blockers Other (See Comments)    Causes high K   . Metoprolol Other (See Comments)    May cause elevated K level   . Penicillins Swelling and Rash    Has patient had a PCN reaction causing immediate rash, facial/tongue/throat swelling, SOB or lightheadedness with hypotension: YES Has patient had a PCN reaction causing severe rash involving mucus membranes or skin necrosis: NO Has patient had a PCN reaction that required hospitalization NO Has patient had a PCN reaction occurring within the last 10 years: NO If all of the above answers are "NO", then may proceed with Cephalosporin use. Pt has tolerated cephalosporins in the past   . Amiodarone Other (See Comments)    12/27/15: Cardiologist discontinued the amiodarone (on PTA) due to  concern for toxicity  (in hospitial with acute hypoxemic respiratory failure)   . Tetracycline Swelling and Rash    Patient Measurements:   Heparin Dosing Weight: 66.8kg  Vital Signs: Pulse Rate: 73 (08/11 1408)  Labs:  Recent Labs  01/19/16 0345 01/20/16 0540 01/18/2016 0550  HGB 9.0* 10.0* 8.9*  HCT 28.5* 32.8* 28.8*  PLT 119* 149* 124*  LABPROT  --  14.7  --   INR  --  1.15  --   CREATININE 1.38* 1.39* 1.21  1.21    Estimated Creatinine Clearance: 49.1 mL/min (by C-G formula based on SCr of 1.21 mg/dL).   Medical History: Past Medical History:  Diagnosis Date  . Actinic keratosis   . Arthritis    "hips" (12/15/2015)  . Bruises easily   . CAD (coronary artery disease)    a. 2010 s/p MI/Cath/PCI ([facility]): LM 10, LAD 30p, 66m, D1 20, D2 30, LCX 30p, 29m (3.5 x 15 Driver BMS), [id number], RCA 70p, 83m; b. 04/2011 Neg MV;  c. 04/2015 Myoview: very mild apical thinning, no ischemia, EF 65%.  . Carotid arterial disease  ([place])    a. 10/2015 Carotid US: bilat 1-39% heterogeneous plaque/stenosis. Nl subclavian arteries bilat.  . Chronic back pain    scoliosis/stenosis/radiculopathy,degenerative disc disease  . Chronic diastolic CHF (congestive heart failure) ([place])    a. 08/2015 Echo: EF 55-60%, mild LVH, no rwma, Gr1 DD, mod AS, mildly dil LA, PASP 98mmHg.  . CKD (chronic kidney disease), stage III    a. 09/2015 AKI in setting of dehydration - seen @ [facility].  Marland Kitchen COPD (chronic obstructive pulmonary disease) ([place]) dx'd 12/10/2015  . Degeneration of intervertebral disc, site unspecified   . Dyspnea on exertion   . Esophageal reflux    takes Omeprazole daily  . Gastritis 12/10  . GI bleed    a. 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  . Hemorrhoids   . History of blood transfusion 2010   "don't remember why"  . History of colonic polyps   . Hyperlipidemia    a. takes pravachol.  . Hypertensive heart disease    a. takes Metoprolol daily  . Joint pain   . Lumbar stenosis    a. s/p lumbar diskectomy 2017.  . Moderate aortic stenosis    a. 08/2015 Mod AS, valve area (VTI) - 1.5cm^2, (Vmax) - 1.25cm^2; b. 10/2015 TEE EF 55-60%, mod AS.  Marland Kitchen Nephrolithiasis   . Other psoriasis   . Paroxysmal atrial fibrillation ([place])    a.  06/2011 Post-op AF-->converted on amio;  b. 07/2015 TEE/unsuccessful DCCV x 2; c. 09/2015 recurrent AF/Flutter-->successful DCCV; d. 09/2015 Recurrent AF-->10/2015 s/p TEE (EF 55-60%, mod AS, mild MR, sev dil LA w/o LAA thrombus, sev dil RA, mild to mod TR) & DCCV (120 J x 1).  . Personal history of colonic polyps   . PMR (polymyalgia rheumatica) (HCC) 01/20/2012   tx by specialist on a long prednisone taper  . Pneumonia 2008; 07/2015  . Psoriasis   . Psoriatic arthropathy ([place])   . Rheumatoid arthritis(714.0)    takes Methotrexate 7pills weekly  . Scoliosis (and kyphoscoliosis), idiopathic   . Skin cancer    "arms; they freeze then off; cut one off left elbow recently" (12/15/2015)  . Sleep apnea    "had  it while in hospital in 07/2015" (12/15/2015)  . STEMI (ST elevation myocardial infarction) ([place]) 02/2010   Archie Endo 05/2009  . Type II diabetes mellitus (HCC)     Medications:  Prescriptions Prior to Admission  Medication Sig Dispense Refill Last Dose  . acetaminophen (TYLENOL) 160 MG/5ML solution Place 20.3 mLs (650 mg total) into feeding tube every 6 (six) hours as needed for fever. 120 mL 0   . albuterol (PROVENTIL) (2.5 MG/3ML) 0.083% nebulizer solution Take 3 mLs (2.5 mg total) by nebulization every 2 (two) hours as needed for wheezing. 75 mL 12   . arformoterol (BROVANA) 15 MCG/2ML NEBU Take 2 mLs (15 mcg total) by nebulization 2 (two) times daily. 120 mL    . budesonide (PULMICORT) 0.5 MG/2ML nebulizer solution Take 2 mLs (0.5 mg total) by nebulization 2 (two) times daily.  12   . calcium-vitamin D (OSCAL WITH D) 500-200 MG-UNIT tablet Take 1 tablet by mouth daily with breakfast.   01/03/2016 at Unknown time  . docusate sodium (COLACE) 100 MG capsule Take 100 mg by mouth 2 (two) times daily. Reported on 09/22/2015   01/03/2016 at 0800  . glucose blood (ONE TOUCH ULTRA TEST) test strip Check blood sugar three times daily and as directed. Dx E11.8 diabetes that is poorly controlled 270 each 3 01/03/2016 at Unknown time  . heparin 5000 UNIT/ML injection Inject 1 mL (5,000 Units total) into the skin every 8 (eight) hours. 1 mL    . insulin aspart (NOVOLOG) 100 UNIT/ML injection Inject 0-15 Units into the skin every 4 (four) hours. 10 mL 11   . metoprolol tartrate (LOPRESSOR) 25 mg/10 mL SUSP Place 5 mLs (12.5 mg total) into feeding tube 2 (two) times daily.     Marland Kitchen nystatin (MYCOSTATIN/NYSTOP) powder Apply topically 3 (three) times daily. 15 g 0   . ondansetron (ZOFRAN) 4 MG/2ML SOLN injection Inject 2 mLs (4 mg total) into the vein every 6 (six) hours as needed for nausea or vomiting. 2 mL 0   . predniSONE 5 MG/ML concentrated solution Place 8 mLs (40 mg total) into feeding tube daily. 30 mL 0   .  ranitidine (ZANTAC) 150 MG/10ML syrup Place 10 mLs (150 mg total) into feeding tube 2 (two) times daily. 300 mL 0   . Water For Irrigation, Sterile (FREE WATER) SOLN Place 200 mLs into feeding tube every 4 (four) hours.      Scheduled:  . famotidine (PEPCID) IV  20 mg Intravenous Q12H  . fentaNYL (SUBLIMAZE) injection  50 mcg Intravenous Once  . hydrocortisone sod succinate (SOLU-CORTEF) inj  50 mg Intravenous Q6H  . ipratropium-albuterol  3 mL Nebulization Q6H  . linezolid (ZYVOX) IV  600 mg Intravenous Q12H  .  meropenem (MERREM) IV  1 g Intravenous Q12H   Infusions:  . sodium chloride    . fentaNYL infusion INTRAVENOUS    . phenylephrine (NEO-SYNEPHRINE) Adult infusion 300 mcg/min (01/30/2016 1410)   PRN: fentaNYL, rocuronium  Assessment: Patient is a 75 yo male who presented to ICU from [facility] with suspected HCAP. He is on chronic warfarin for A Fib, but this has been held for several days now. His INR is subtherapeutic today at 1.15.   Patient's hgb is low stable at 8.9 g/dL and platelet count is also low stable at 124 k/uL. It was noted by provider that he developed thrombocytopenia while on linezolid therapy, which is mild and stable. No bleeding noted.    Goal of Therapy:  Heparin level 0.3-0.7 units/ml Monitor platelets by anticoagulation protocol: Yes   Plan:  Give heparin 3,500 units IV bolus x1. Initiate maintenance heparin rate at 1000 units/hr.  Follow up 8-hour HL to ensure therapeutic level.  Daily HL and CBCs while on heparin.    Norwood Levo [id number] 02/01/2016,2:20 PM

## 2016-01-21 NOTE — Progress Notes (Signed)
Wasted 53mL fentanyl (from [facility]) with Nettie Elm RN

## 2016-01-21 NOTE — Progress Notes (Signed)
eLink Physician-Brief Progress Note Patient Name: Curtis Frazier DOB: [birth year] MRN: [id number]   Date of Service  01/31/2016  HPI/Events of Note  Mixed acidosis RR limited by autoPEEP  eICU Interventions  Bicarb gtt     Intervention Category Major Interventions: Adrenal insufficiency - evaluation and management;Acid-Base disturbance - evaluation and management  Mariaclara Spear V. 01/11/2016, 11:01 PM

## 2016-01-21 NOTE — Procedures (Signed)
Bronchoscopy Procedure Note HAZIM MORTER [id number] 06-Jul-1940  Procedure: Bronchoscopy Indications: Diagnostic evaluation of the airways, Obtain specimens for culture and/or other diagnostic studies and Remove secretions  Procedure Details Consent: Risks of procedure as well as the alternatives and risks of each were explained to the (patient/caregiver).  Consent for procedure obtained. Time Out: Verified patient identification, verified procedure, site/side was marked, verified correct patient position, special equipment/implants available, medications/allergies/relevent history reviewed, required imaging and test results available.  Performed  In preparation for procedure, patient was given 100% FiO2 and bronchoscope lubricated. Sedation: Etomidate  Airway entered and the following bronchi were examined: RUL, RML, RLL, LUL, LLL and Bronchi.   Procedures performed: Brushings performed- no Bronchoscope removed.  , Patient placed back on 100% FiO2 at conclusion of procedure.    Evaluation Hemodynamic Status: BP stable throughout; O2 sats: stable throughout Patient's Current Condition: stable Specimens:  Sent purulent fluid Complications: No apparent complications Patient did tolerate procedure well.   [name]. 02/05/2016   1. Chronic divert 2. Pus thin creamy severe RLL,LUL, lavaged,.extensive diffuse pus distalplugs 3.BAL RLL pus creamy  [name]. [name], MD, [place] Pgr: [place]

## 2016-01-21 NOTE — Progress Notes (Signed)
RT called and informed RN of Panic Values of ABG.

## 2016-01-21 NOTE — Procedures (Signed)
Central Venous Catheter Insertion Procedure Note Curtis Frazier [id number] 07/13/1940  Procedure: Insertion of Central Venous Catheter Indications: Assessment of intravascular volume, Drug and/or fluid administration and Frequent blood sampling  Procedure Details Consent: Risks of procedure as well as the alternatives and risks of each were explained to the (patient/caregiver).  Consent for procedure obtained. Time Out: Verified patient identification, verified procedure, site/side was marked, verified correct patient position, special equipment/implants available, medications/allergies/relevent history reviewed, required imaging and test results available.  Performed  Maximum sterile technique was used including antiseptics, cap, gloves, gown, hand hygiene, mask and sheet. Skin prep: Chlorhexidine; local anesthetic administered A antimicrobial bonded/coated triple lumen catheter was placed in the right femoral vein due to emergent situation using the Seldinger technique.  Evaluation Blood flow good Complications: No apparent complications Patient did tolerate procedure well. Chest X-ray ordered to verify placement.  CXR: noe needed.  Curtis Frazier 01/16/2016, 3:19 PM   Loletha Grayer , near arrest, all access lost  Rn attempting, unsuccessful Shock  [name]. [name], MD, [place] Pgr: [facility]

## 2016-01-21 NOTE — Progress Notes (Signed)
[facility] Progress Note Patient Name: LAKYN AVE DOB: 03-05-1941 MRN: [id number]   Date of Service  01/13/2016  HPI/Events of Note    eICU Interventions  Clarified no CPR, no CV & orders placed     Intervention Category Major Interventions: End of life / care limitation discussion  ALVA,RAKESH V. 01/30/2016, 10:24 PM

## 2016-01-21 NOTE — Procedures (Signed)
Arterial Catheter Insertion Procedure Note Curtis Frazier [id number] Oct 27, 1940  Procedure: Insertion of Arterial Catheter  Indications: Blood pressure monitoring and Frequent blood sampling  Procedure Details Consent: Unable to obtain consent because of emergent medical necessity. Time Out: Verified patient identification, verified procedure, site/side was marked, verified correct patient position, special equipment/implants available, medications/allergies/relevent history reviewed, required imaging and test results available.  Performed  Maximum sterile technique was used including antiseptics, cap, gloves, gown, hand hygiene, mask and sheet. Skin prep: Chlorhexidine; local anesthetic administered 20 gauge catheter was inserted into right radial artery using the Seldinger technique.  Evaluation Blood flow good; BP tracing good. Complications: No apparent complications.   Curtis Frazier 02/06/2016  Near arrest Shock Curtis Frazier. Curtis Mould, MD, [place] Pgr: [facility]

## 2016-01-21 NOTE — Progress Notes (Signed)
Name: Curtis Frazier MRN: [id number] DOB: 05/07/1941    ADMISSION DATE:  01/19/2016 CONSULTATION DATE:  8/9   REFERRING MD :  Dr. Laren Everts  CHIEF COMPLAINT:  Trach dependent   HISTORY OF PRESENT ILLNESS:    75 year old male with baseline RA and COPD and a recent admission for acute respiratory failure with hypoxemia.  He was admitted on 7/24 with ARDS most likely due to HCAP but there has been question of underlying ILD related to his rheumatoid arthritis. He was intubated for respiratory failure & septic shock and treated in usual fashion with volume resuscitation, vaso-active gtts, empiric ABX and supportive care. He went on to require ARDS protocol and neuromuscular blockade. He required CRRT for refractory acidosis. Sedation caused hypotension requiring vasopressor use. He was eventually weaned from high-PEEP/fio2 and pressors, but he could not be weaned from the ventilatory requiring tracheostomy 8/3. Since tracheostomy he has been weaning well and had been tolerating ATC during waking hours. 8/9 he was transferred to [facility] for [facility] rehab. Pulmonary has been asked to see.   SIGNIFICANT EVENTS  8/9 transfer to [facility]. At time of transfer we had resumed tube feeds, he was awake and interactive on ATC. He had pulled out his NGT the night prior & had purulent colored sputum so respiratory culture sent 8/10 follow up pulmonary rounds. Lethargic, stopped morphine and oxycodone. Prelim sputum growing GNR-->started on maxipime. Family very concerned. ABG sent. FYI MSO4 and oxy still on mar 8/11. 8/11 acute resp distress. STUDIES:  8/11 FOB>>  SUBJECTIVE:  Anxious and in acute distress from air hunger  VITAL SIGNS: 98.6 hr 83 rr 20 114/50 sats 99% (ATC)  PHYSICAL EXAMINATION: General:  Very anxious on full vent support Neuro:  Very anxious but follows commands HEENT:  [place]/AT, #6 cuffed trach in place Cardiovascular:  RRR, no MRG Lungs:  Coarse bases, Abd / cw paradoxus, air  hungry  Abdomen:  Soft, slight tenderness, non-distended Musculoskeletal:  No acute deformity  Skin:  Grossly intact   Recent Labs Lab 01/19/16 0345 01/20/16 0540 02/01/2016 0550  NA 147* 152* 148*  149*  K 4.0 3.9 3.4*  3.5  CL 116* 117* 116*  115*  CO2 27 28 25  26   BUN 49* 39* 37*  38*  CREATININE 1.38* 1.39* 1.21  1.21  GLUCOSE 197* 148* 259*  258*    Recent Labs Lab 01/19/16 0345 01/20/16 0540 01/15/2016 0550  HGB 9.0* 10.0* 8.9*  HCT 28.5* 32.8* 28.8*  WBC 12.6* 15.2* 13.8*  PLT 119* 149* 124*   Dg Chest Port 1 View  Result Date: 01/15/2016 CLINICAL DATA:  Respiratory failure. EXAM: PORTABLE CHEST 1 VIEW COMPARISON:  01/19/2016. FINDINGS: Interim removal of feeding tube and replacement with NG tube. NG tube tip below left hemidiaphragm. Tracheostomy tube in stable position. Persistent pulmonary interstitial prominence suggesting congestive heart failure. Similar findings noted on prior exam. Low lung volumes with progressive bibasilar atelectasis. No pleural effusion or pneumothorax. IMPRESSION: 1. Interim removal of feeding tube and replacement with NG tube . Tracheostomy tube 2. Cardiomegaly with diffuse bilateral pulmonary interstitial prominence again noted. Findings suggest congestive heart failure. Similar findings noted on prior exam. 3.  Low lung volumes with progressive bibasilar atelectasis Electronically Signed   By: Marcello Moores  Register   On: 02/07/2016 07:40   Dg Chest Port 1 View  Result Date: 01/19/2016 CLINICAL DATA:  Respiratory failure, history of chronic diastolic congestive heart failure EXAM: PORTABLE CHEST 1 VIEW COMPARISON:  01/15/2016 FINDINGS: Cardiomediastinal silhouette is stable. There is NG feeding tube with tip in mid stomach. Stable tracheostomy tube position. Metallic fixation rods noted lower thoracic and lumbar spine. There is mild interstitial prominence bilaterally suspicious for mild interstitial edema. No segmental infiltrates. Stable left  basilar atelectasis or scarring. IMPRESSION: Stable tracheostomy tube position. NG feeding tube with tip in mid stomach. Mild interstitial prominence bilateral suspicious for mild interstitial edema. No segmental infiltrate. Electronically Signed   By: Lahoma Crocker M.D.   On: 01/19/2016 16:18   Dg Abd Portable 1v  Result Date: 01/20/2016 CLINICAL DATA:  NG tube placement. EXAM: PORTABLE ABDOMEN - 1 VIEW COMPARISON:  Film earlier this day FINDINGS: An NG tube is identified with tip overlying the mid stomach. No other changes identified. IMPRESSION: NG tube with tip overlying the mid stomach. Electronically Signed   By: Margarette Canada M.D.   On: 01/20/2016 01:58   Dg Abd Portable 1v  Result Date: 01/20/2016 CLINICAL DATA:  Nasogastric tube placement.  Initial encounter. EXAM: PORTABLE ABDOMEN - 1 VIEW COMPARISON:  Abdominal radiograph performed earlier today at 3:52 p.m. FINDINGS: The patient's enteric tube is seen ending overlying the body of the stomach. The visualized bowel gas pattern is grossly unremarkable, though not well assessed due to motion artifact. Thoracolumbar spinal fusion hardware is noted. No acute osseous abnormalities are seen. IMPRESSION: Enteric tube noted ending overlying the body of the stomach. Electronically Signed   By: Garald Balding M.D.   On: 01/20/2016 00:13   Dg Abd Portable 1v  Result Date: 01/19/2016 CLINICAL DATA:  NG tube placement, history of gastritis EXAM: PORTABLE ABDOMEN - 1 VIEW COMPARISON:  None. FINDINGS: There is normal small bowel gas pattern. There is NG feeding tube with tip in mid stomach. IMPRESSION: NG feeding tube with tip in mid stomach. Normal small bowel gas pattern. Electronically Signed   By: Lahoma Crocker M.D.   On: 01/19/2016 16:19   Dg Abd Portable 1v  Result Date: 01/19/2016 CLINICAL DATA:  Feeding tube placement. EXAM: PORTABLE ABDOMEN - 1 VIEW COMPARISON:  01/13/2016 FINDINGS: Weighted feeding tube is been placed with the tip located roughly at the  level of the mid stomach. Visualized upper abdominal bowel gas pattern is unremarkable. IMPRESSION: Feeding tube tip is roughly at the level of the mid stomach. Electronically Signed   By: Aletta Edouard M.D.   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 H/O COPD with emphysema,severe. H/O OSA. 8/11, Acute resp distress with hypoxia, increase WOB, air hunger, abd cw paradoxus. Recent high fever.  Plan Full vent support Consider CT scan to rule out PE and support for treatment for HCAP Consider FOB for airway inspection Follow 8/9 sputum culture(gnr) Consider adding vancomycin till all cultures back  Acute Encephalopathy-->better  as of 8/11 but very anxious. Think this may be medication induced (started on oxy and morphine on admission to [facility]).  He may need anxiolytic due to current anxiety levels. Plan  Consider low dose anxiolytic and pain meds  H/o afib Plan Monitoring on telemetry Vitals per unit protocol  Hold ASA with concern about gastritis Holding heparin gtt & coumadin. At some point this can be re-addressed-->would consider repeat FOB first.  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 Stable. Lab Results  Component Value Date   CREATININE 1.21 01/28/2016   CREATININE 1.21 01/17/2016   CREATININE 1.39 (H) 01/20/2016   CREATININE 1.70 (H) 12/08/2015    Plan Trend chemistry Avoid volume depletion w/ recovering renal fxn   Severe Protein calorie malnutrition Critical illness associated Dysphagia  Gastritis. Plan: NPO-->will need SLP eval for swallowing assessment Tubefeeds w/ dietary follow  up to ensure meeting nutritional needs   Thrombocytopenia - Mild. Stable/improved Anemia - Likely chronic illness. Hgb stable. No signs of active bleeding. Plan Trending cell counts & INR daily Heparin [place] for DVT prophylaxis  SCDs Transfuse for Hgb <8.0 or signs of active bleeding  Sacral Pressure Ulcer R Ear Ulcer Groin/Skin Fold Rash Plan: Wound care per WOC recs: sacral dressing change q 3, Interdry Ag to skin folds q5d Would recommend Wound nurse follow up at [facility]  Discussion: Obvious change in respiratory status rom 8/10 when he was tolerating T-collar. Now with RR 37 and FIO2 50% and worsening hypoxia. Questionable PE. Airway blockage. Worsening pna/ Suggest CtA chest, possible FOB and anxiolytics for comfort.    Richardson Landry Minor ACNP Maryanna Shape PCCM Pager [phone] till 3 pm If no answer page [phone] 01/30/2016, 10:45 AM  STAFF NOTE: I, Merrie Roof, MD FACP have personally reviewed patient's available data, including medical history, events of note, physical examination and test results as part of my evaluation. I have discussed with resident/NP and other care providers such as pharmacist, RN and RRT. In addition, I personally evaluated patient and elicited key findings [id number] status this am, ronchi, pcxr increased infiltrate rt,r/o mucous plug, to bronch,bronch perfromed, extesnive pus , see report, add linazolid ( in setting vanc risk arf) to meropenem, send BAL, peep increase needed 10-, 100%, get ABG,stat pcxr, LFt unclear, assess ruq , I think bronch explains worsening status also fever 102, will assess rv on echo also, I updated daughter at bedside extensive, requires move to [facility] ICU,needs fent drip also for sychrony The patient is critically ill with multiple organ systems failure and requires high complexity decision making for assessment and support, frequent evaluation and titration of therapies, application of advanced monitoring technologies and extensive  interpretation of multiple databases.   Critical Care Time devoted to patient care services described in this note is35 Minutes. This time reflects time of care of this signee: Merrie Roof, MD FACP. This critical care time does not reflect procedure time, or teaching time or supervisory time of PA/NP/Med student/Med Resident etc but could involve care discussion time. Rest per NP/medical resident whose note is outlined above and that I agree with   [name]. [name], MD, [place] Pgr: [facility] 01/24/2016 12:17 PM

## 2016-01-21 NOTE — Progress Notes (Addendum)
[place] Progress Note Patient Name: Curtis Frazier DOB: 13-Dec-1940 MRN: [id number]   Date of Service  02/06/2016  HPI/Events of Note  Multiple issues: 1. Lactic Acid = 2.1 and 2. Troponin = 0.11. Demand ischemia? Nurse states that ST segment looks depressed in lead V.  eICU Interventions  Will order: 1. 12 Lead EKG now.  2. Continue to trend Lactic Acid with BP improved and continue to trend Troponin.     Intervention Category Intermediate Interventions: Diagnostic test evaluation  Lysle Dingwall 01/17/2016, 6:17 PM

## 2016-01-22 DIAGNOSIS — R6521 Severe sepsis with septic shock: Secondary | ICD-10-CM

## 2016-01-22 DIAGNOSIS — J9620 Acute and chronic respiratory failure, unspecified whether with hypoxia or hypercapnia: Secondary | ICD-10-CM

## 2016-01-22 DIAGNOSIS — J9600 Acute respiratory failure, unspecified whether with hypoxia or hypercapnia: Secondary | ICD-10-CM

## 2016-01-22 DIAGNOSIS — A4102 Sepsis due to Methicillin resistant Staphylococcus aureus: Principal | ICD-10-CM

## 2016-01-22 DIAGNOSIS — R7881 Bacteremia: Secondary | ICD-10-CM

## 2016-01-22 DIAGNOSIS — E43 Unspecified severe protein-calorie malnutrition: Secondary | ICD-10-CM | POA: Insufficient documentation

## 2016-01-22 DIAGNOSIS — A4101 Sepsis due to Methicillin susceptible Staphylococcus aureus: Secondary | ICD-10-CM

## 2016-01-22 DIAGNOSIS — Z930 Tracheostomy status: Secondary | ICD-10-CM

## 2016-01-22 DIAGNOSIS — L8990 Pressure ulcer of unspecified site, unspecified stage: Secondary | ICD-10-CM | POA: Insufficient documentation

## 2016-01-22 LAB — BLOOD GAS, ARTERIAL
ACID-BASE DEFICIT: 9.5 mmol/L — AB (ref 0.0–2.0)
ACID-BASE DEFICIT: 12.5 mmol/L — AB (ref 0.0–2.0)
Acid-base deficit: 9.2 mmol/L — ABNORMAL HIGH (ref 0.0–2.0)
Acid-base deficit: 10.9 mmol/L — ABNORMAL HIGH (ref 0.0–2.0)
BICARBONATE: 17.1 meq/L — AB (ref 20.0–24.0)
BICARBONATE: 13.9 meq/L — AB (ref 20.0–24.0)
BICARBONATE: 14.8 meq/L — AB (ref 20.0–24.0)
Bicarbonate: 17.5 mEq/L — ABNORMAL LOW (ref 20.0–24.0)
DRAWN BY: 365271
DRAWN BY: 276051
Drawn by: 418751
Drawn by: 365271
FIO2: 100
FIO2: 100
FIO2: 60
FIO2: 0.5
LHR: 28 {breaths}/min
LHR: 28 {breaths}/min
MECHVT: 500 mL
O2 SAT: 96.5 %
O2 SAT: 89.9 %
O2 Saturation: 97.5 %
O2 Saturation: 89.4 %
PATIENT TEMPERATURE: 99.7
PATIENT TEMPERATURE: 98.4
PCO2 ART: 47 mmHg — AB (ref 35.0–45.0)
PCO2 ART: 46.9 mmHg — AB (ref 35.0–45.0)
PCO2 ART: 36.2 mmHg (ref 35.0–45.0)
PCO2 ART: 34.2 mmHg — AB (ref 35.0–45.0)
PEEP/CPAP: 10 cmH2O
PEEP/CPAP: 8 cmH2O
PEEP: 10 cmH2O
PEEP: 8 cmH2O
PH ART: 7.191 — AB (ref 7.350–7.450)
PO2 ART: 125 mmHg — AB (ref 80.0–100.0)
Patient temperature: 98.6
Patient temperature: 98.4
RATE: 28 resp/min
RATE: 28 resp/min
TCO2: 18.5 mmol/L (ref 0–100)
TCO2: 18.9 mmol/L (ref 0–100)
TCO2: 15 mmol/L (ref 0–100)
TCO2: 15.9 mmol/L (ref 0–100)
VT: 500 mL
VT: 500 mL
VT: 500 mL
pH, Arterial: 7.196 — CL (ref 7.350–7.450)
pH, Arterial: 7.208 — ABNORMAL LOW (ref 7.350–7.450)
pH, Arterial: 7.258 — ABNORMAL LOW (ref 7.350–7.450)
pO2, Arterial: 111 mmHg — ABNORMAL HIGH (ref 80.0–100.0)
pO2, Arterial: 72.7 mmHg — ABNORMAL LOW (ref 80.0–100.0)
pO2, Arterial: 65.9 mmHg — ABNORMAL LOW (ref 80.0–100.0)

## 2016-01-22 LAB — TROPONIN I: Troponin I: 0.06 ng/mL (ref <0.03)

## 2016-01-22 LAB — BLOOD CULTURE ID PANEL (REFLEXED)
Acinetobacter baumannii: NOT DETECTED
CANDIDA ALBICANS: NOT DETECTED
CANDIDA PARAPSILOSIS: NOT DETECTED
Candida glabrata: NOT DETECTED
Candida krusei: NOT DETECTED
Candida tropicalis: NOT DETECTED
Carbapenem resistance: NOT DETECTED
ENTEROBACTER CLOACAE COMPLEX: NOT DETECTED
ENTEROBACTERIACEAE SPECIES: NOT DETECTED
ENTEROCOCCUS SPECIES: NOT DETECTED
Escherichia coli: NOT DETECTED
Haemophilus influenzae: NOT DETECTED
KLEBSIELLA PNEUMONIAE: NOT DETECTED
Klebsiella oxytoca: NOT DETECTED
Listeria monocytogenes: NOT DETECTED
METHICILLIN RESISTANCE: DETECTED — AB
Neisseria meningitidis: NOT DETECTED
PSEUDOMONAS AERUGINOSA: NOT DETECTED
Proteus species: NOT DETECTED
STAPHYLOCOCCUS AUREUS BCID: DETECTED — AB
STREPTOCOCCUS AGALACTIAE: NOT DETECTED
STREPTOCOCCUS PNEUMONIAE: NOT DETECTED
STREPTOCOCCUS SPECIES: NOT DETECTED
Serratia marcescens: NOT DETECTED
Staphylococcus species: DETECTED — AB
Streptococcus pyogenes: NOT DETECTED
VANCOMYCIN RESISTANCE: NOT DETECTED

## 2016-01-22 LAB — CBC
HCT: 31.5 % — ABNORMAL LOW (ref 39.0–52.0)
Hemoglobin: 9.3 g/dL — ABNORMAL LOW (ref 13.0–17.0)
MCH: 29.7 pg (ref 26.0–34.0)
MCHC: 29.5 g/dL — ABNORMAL LOW (ref 30.0–36.0)
MCV: 100.6 fL — ABNORMAL HIGH (ref 78.0–100.0)
PLATELETS: 138 10*3/uL — AB (ref 150–400)
RBC: 3.13 MIL/uL — ABNORMAL LOW (ref 4.22–5.81)
RDW: 20.1 % — AB (ref 11.5–15.5)
WBC: 18.3 10*3/uL — ABNORMAL HIGH (ref 4.0–10.5)

## 2016-01-22 LAB — CULTURE, RESPIRATORY

## 2016-01-22 LAB — HEPARIN LEVEL (UNFRACTIONATED)
Heparin Unfractionated: <0.1 IU/mL — ABNORMAL LOW (ref 0.30–0.70)
Heparin Unfractionated: 0.31 IU/mL (ref 0.30–0.70)

## 2016-01-22 LAB — CORTISOL: Cortisol, Plasma: 79.4 ug/dL

## 2016-01-22 LAB — BASIC METABOLIC PANEL
ANION GAP: 12 (ref 5–15)
BUN: 54 mg/dL — AB (ref 6–20)
CALCIUM: 7.1 mg/dL — AB (ref 8.9–10.3)
CO2: 19 mmol/L — ABNORMAL LOW (ref 22–32)
CREATININE: 2.35 mg/dL — AB (ref 0.61–1.24)
Chloride: 115 mmol/L — ABNORMAL HIGH (ref 101–111)
GFR calc Af Amer: 29 mL/min — ABNORMAL LOW (ref >60)
GFR, EST NON AFRICAN AMERICAN: 25 mL/min — AB (ref >60)
GLUCOSE: 413 mg/dL — AB (ref 65–99)
Potassium: 5.1 mmol/L (ref 3.5–5.1)
Sodium: 146 mmol/L — ABNORMAL HIGH (ref 135–145)

## 2016-01-22 LAB — URINE CULTURE: CULTURE: NO GROWTH

## 2016-01-22 LAB — CULTURE, RESPIRATORY W GRAM STAIN

## 2016-01-22 LAB — PROCALCITONIN: PROCALCITONIN: 90.54 ng/mL

## 2016-01-22 LAB — PHOSPHORUS: Phosphorus: 7.2 mg/dL — ABNORMAL HIGH (ref 2.5–4.6)

## 2016-01-22 LAB — MAGNESIUM: MAGNESIUM: 1.7 mg/dL (ref 1.7–2.4)

## 2016-01-22 MED ORDER — SODIUM CHLORIDE 0.9 % IV SOLN
INTRAVENOUS | Status: DC
  Administered 2016-01-23: 1.6 [IU]/h via INTRAVENOUS
  Administered 2016-01-23: 14.5 [IU]/h via INTRAVENOUS
  Administered 2016-01-23: 3.8 [IU]/h via INTRAVENOUS
  Administered 2016-01-23: 5.6 [IU]/h via INTRAVENOUS
  Administered 2016-01-23: 15.6 [IU]/h via INTRAVENOUS
  Filled 2016-01-22 (×2): qty 2.5

## 2016-01-22 MED ORDER — HEPARIN BOLUS VIA INFUSION
2000.0000 [IU] | Freq: Once | INTRAVENOUS | Status: AC
  Administered 2016-01-22: 2000 [IU] via INTRAVENOUS
  Filled 2016-01-22: qty 2000

## 2016-01-22 MED ORDER — SODIUM POLYSTYRENE SULFONATE 15 GM/60ML PO SUSP
30.0000 g | Freq: Once | ORAL | Status: AC
  Administered 2016-01-22: 30 g
  Filled 2016-01-22: qty 120

## 2016-01-22 MED ORDER — METRONIDAZOLE IN NACL 5-0.79 MG/ML-% IV SOLN
500.0000 mg | Freq: Three times a day (TID) | INTRAVENOUS | Status: DC
  Administered 2016-01-22 – 2016-01-25 (×9): 500 mg via INTRAVENOUS
  Filled 2016-01-22 (×13): qty 100

## 2016-01-22 MED ORDER — INSULIN ASPART 100 UNIT/ML ~~LOC~~ SOLN
1.0000 [IU] | SUBCUTANEOUS | Status: DC
  Administered 2016-01-22: 3 [IU] via SUBCUTANEOUS

## 2016-01-22 MED ORDER — SODIUM CHLORIDE 0.9 % IV BOLUS (SEPSIS)
3000.0000 mL | Freq: Once | INTRAVENOUS | Status: AC
  Administered 2016-01-22: 3000 mL via INTRAVENOUS

## 2016-01-22 MED ORDER — SODIUM CHLORIDE 0.9% FLUSH
10.0000 mL | INTRAVENOUS | Status: DC | PRN
Start: 2016-01-22 — End: 2016-01-27

## 2016-01-22 MED ORDER — HEPARIN (PORCINE) IN NACL 100-0.45 UNIT/ML-% IJ SOLN
1300.0000 [IU]/h | INTRAMUSCULAR | Status: DC
  Filled 2016-01-22 (×3): qty 250

## 2016-01-22 MED ORDER — FAMOTIDINE IN NACL 20-0.9 MG/50ML-% IV SOLN
20.0000 mg | INTRAVENOUS | Status: DC
  Administered 2016-01-23 – 2016-01-25 (×3): 20 mg via INTRAVENOUS
  Filled 2016-01-22 (×3): qty 50

## 2016-01-22 MED ORDER — SODIUM CHLORIDE 0.9% FLUSH
10.0000 mL | Freq: Two times a day (BID) | INTRAVENOUS | Status: DC
  Administered 2016-01-23 – 2016-01-26 (×3): 10 mL

## 2016-01-22 MED ORDER — SODIUM CHLORIDE 0.9 % IV SOLN
300.0000 mg | Freq: Two times a day (BID) | INTRAVENOUS | Status: DC
  Administered 2016-01-22 – 2016-01-25 (×7): 300 mg via INTRAVENOUS
  Filled 2016-01-22 (×9): qty 300

## 2016-01-22 NOTE — Progress Notes (Signed)
ANTICOAGULATION CONSULT NOTE - Follow Up Consult  Pharmacy Consult for Heparin  Indication: atrial fibrillation  Allergies  Allergen Reactions  . Ace Inhibitors Other (See Comments)    Causes high K   . Angiotensin Receptor Blockers Other (See Comments)    Causes high K   . Metoprolol Other (See Comments)    May cause elevated K level   . Penicillins Swelling and Rash    Has patient had a PCN reaction causing immediate rash, facial/tongue/throat swelling, SOB or lightheadedness with hypotension: YES Has patient had a PCN reaction causing severe rash involving mucus membranes or skin necrosis: NO Has patient had a PCN reaction that required hospitalization NO Has patient had a PCN reaction occurring within the last 10 years: NO If all of the above answers are "NO", then may proceed with Cephalosporin use. Pt has tolerated cephalosporins in the past   . Amiodarone Other (See Comments)    12/27/15: Cardiologist discontinued the amiodarone (on PTA) due to  concern for toxicity  (in hospitial with acute hypoxemic respiratory failure)   . Tetracycline Swelling and Rash    Patient Measurements: Weight: 150 lb 12.7 oz (68.4 kg) Heparin Dosing Weight: 68.4 kg  Vital Signs: Temp: 98.4 F (36.9 C) (08/12 0755) Temp Source: Oral (08/12 0755) BP: 92/50 (08/12 1000) Pulse Rate: 82 (08/12 1000)  Labs:  Recent Labs  01/20/16 0540  01/12/2016 1447 01/13/2016 1859 02/02/2016 2033 01/28/2016 2338 01/22/16 0201 01/22/16 0415 01/22/16 1010  HGB 10.0*  < > 9.4* 9.5*  --   --  9.3*  --   --   HCT 32.8*  < > 31.1* 31.7*  --   --  31.5*  --   --   PLT 149*  < > 148* 141*  --   --  138*  --   --   APTT  --   --  31  --   --   --   --   --   --   LABPROT 14.7  --  15.7*  --   --   --   --   --   --   INR 1.15  --  1.24  --   --   --   --   --   --   HEPARINUNFRC  --   --   --   --   --  <0.10*  --   --  <0.10*  CREATININE 1.39*  < > 1.85* 1.86*  --   --  2.35*  --   --   TROPONINI  --   --   0.11*  --  0.08*  --   --  0.06*  --   < > = values in this interval not displayed.  Estimated Creatinine Clearance: 25.9 mL/min (by C-G formula based on SCr of 2.35 mg/dL).   Assessment: Patient is a 75 yo male who is presented to ICU from [facility] with suspected HCAP. He is on chronic warfarin therapy for a fib, but this has been held for several days now. His INR is 1.24 today.   Patient's hgb is low stable at 9.3 g/dL and platelet count is also low stable at 138 k/uL. It was noted by provider that he developed thrombocytopenia while on linezolid therapy, which is mild and stable. No bleeding noted.    Patient's HL has come back < 0.10 x2. Heparin was running through is peripheral IV, which may have been occluded/leaking per RN. It has since been  switched to his central line. Will continue current heparin rate.   Goal of Therapy:  Heparin level 0.3-0.7 units/ml Monitor platelets by anticoagulation protocol: Yes   Plan:  Continue heparin IV 1250 units/hr Follow up 8-hour heparin level Daily HL and CBC monitoring while on heparin  Delane Ginger 01/22/2016,12:03 PM

## 2016-01-22 NOTE — Progress Notes (Signed)
PULMONARY / CRITICAL CARE MEDICINE   Name: Curtis Frazier MRN: [id number] DOB: Mar 23, 1941    ADMISSION DATE:  01/30/2016   REFERRING MD: Curtis Frazier  CHIEF COMPLAINT:  Acute on chronic resp failure  BRIEF 75 yo male with recent admit and dc from [facility] with baseline RA , steroid dependent, developed ARDS and required tracheostomy. Renal function was stage 3 but he did come off CVVH. Transferred to [facility] 8/9 and PCCM consulted on same day. He spent 12 hours on trach collar 8/10 but on 8/11 he was back on full vent support with rr 37 , severe air hunger and anxiety. FOB per [facility] with rt lower lobe with copious thick white secretions. Due ti his decline aand family wishes he will be transferred to [facility] and to ICU and treated for suspected HCAP.     STUDIES:    CULTURES: 8/9 sptum GNR>> 8/11 bal>> 8/11 bc x 2>> 8/11 uc>> 8/12 procal>>90  ANTIBIOTICS: 8/11 zyvox>> 8/11 meropenem >>  EVENTS: Trach 8/6>> 8/9./17 - dc to [facility] 8/11 transferred from [facility] to [facility], aline placed 8/11 2 d>> 8/11 Abd US>> 01/30/2016 - femoral line and aline   EVENTS 01/22/16 - on 22mg levophed. On vasopression. ID says he is bacteermie with possible MRSA. Ltd code. On IV heparin for A Fib. Severe metab acidosis despite bic gtt. Creat getting worse. shiock liver some btter. Daughter at bedside  VITAL SIGNS: BP (!) 92/50   Pulse 82   Temp 98.4 F (36.9 C) (Oral)   Resp (!) 28   Wt 68.4 kg (150 lb 12.7 oz)   SpO2 98%   BMI 22.93 kg/m   HEMODYNAMICS:    VENTILATOR SETTINGS: Vent Mode: PRVC FiO2 (%):  [50 %-100 %] 50 % Set Rate:  [20 bmp-28 bmp] 28 bmp Vt Set:  [500 mL] 500 mL PEEP:  [8 cmH20-10 cmH20] 8 cmH20 Plateau Pressure:  [20 cXKG81-85cmH20] 24 cmH20  INTAKE / OUTPUT: I/O last 3 completed shifts: In: 2681.7 [I.V.:2081.7; IV PUDJSHFWYO:378]Out: 100 [Urine:100]  PHYSICAL EXAMINATION:  General:   full vent support Neuro: sedated  HEENT:  [place]/AT, #6 cuffed trach in  place Cardiovascular:  RRR, no MRG Lungs:  Coarse bases, sync wuith vent, 40% fio2 Abdomen:  Soft, slight tenderness, non-distended Musculoskeletal:  No acute deformity  Skin:  Grossly intact  LABS: PULMONARY  Recent Labs Lab 01/15/2016 1020 01/25/2016 1235 01/31/2016 2233 01/22/16 0155 01/22/16 0412  PHART 7.398 7.266* 7.198* 7.191* 7.196*  PCO2ART 41.8 57.1* 48.0* 47.0* 46.9*  PO2ART 69.9* 62.0* 96.6 111* 125*  HCO3 25.2* 25.1* 17.8* 17.1* 17.5*  TCO2 26.5 26.9 19.2 18.5 18.9  O2SAT 92.1 84.7 94.8 96.5 97.5    CBC  Recent Labs Lab 01/24/2016 1447 01/16/2016 1859 01/22/16 0201  HGB 9.4* 9.5* 9.3*  HCT 31.1* 31.7* 31.5*  WBC 17.3* 17.5* 18.3*  PLT 148* 141* 138*    COAGULATION  Recent Labs Lab 01/16/16 0418 01/17/16 0600 01/20/16 0540 01/23/2016 1447  INR 1.39 1.29 1.15 1.24    CARDIAC   Recent Labs Lab 02/04/2016 1447 01/18/2016 2033 01/22/16 0415  TROPONINI 0.11* 0.08* 0.06*   No results for input(s): PROBNP in Curtis last 168 hours.   CHEMISTRY  Recent Labs Lab 01/19/16 0345 01/20/16 0540 01/26/2016 0550 02/02/2016 1447 01/26/2016 1859 01/22/16 0201  NA 147* 152* 148*  149* 148* 145 146*  K 4.0 3.9 3.4*  3.5 3.8 4.5 5.1  CL 116* 117* 116*  115* 119* 120* 115*  CO2  27 28 25  26 24  21* 19*  GLUCOSE 197* 148* 259*  258* 284* 320* 413*  BUN 49* 39* 37*  38* 44* 47* 54*  CREATININE 1.38* 1.39* 1.21  1.21 1.85* 1.86* 2.35*  CALCIUM 8.4* 8.8* 8.2*  8.2* 7.7* 7.2* 7.1*  MG 1.7 1.5* 1.8 1.7  --  1.7  PHOS 2.8 2.7 2.1* 4.0  --  7.2*   Estimated Creatinine Clearance: 25.9 mL/min (by C-G formula based on SCr of 2.35 mg/dL).   LIVER  Recent Labs Lab 01/16/16 0418 01/17/16 0600  01/19/16 0345 01/20/16 0540 01/11/2016 0550 02/10/2016 1447 02/08/2016 1859  AST 219* 133*  --   --  256* 215* 156* 148*  ALT 241* 190*  --   --  229* 255* 230* 216*  ALKPHOS 257* 293*  --   --  685* 745* 688* 661*  BILITOT 1.1 1.4*  --   --  1.5* 0.9 1.1 1.1  PROT 5.3* 5.1*   --   --  5.2* 4.6* 4.4* 4.2*  ALBUMIN 1.9*  2.0* 2.0*  1.9*  < > 1.8* 1.9* 1.5*  1.5* 1.4* 1.3*  INR 1.39 1.29  --   --  1.15  --  1.24  --   < > = values in this interval not displayed.   INFECTIOUS  Recent Labs Lab 01/29/2016 1447 02/06/2016 1859 02/05/2016 1900 01/22/16 0201  LATICACIDVEN 2.1*  --  1.8  --   PROCALCITON 6.68 28.30  --  90.54     ENDOCRINE CBG (last 3)  No results for input(s): GLUCAP in Curtis last 72 hours.       IMAGING x48h  - image(s) personally visualized  -   highlighted in bold US Abdomen Complete  Result Date: 01/23/2016 CLINICAL DATA:  Inpatient. Two days of abdominal pain. Elevated liver function tests. Patient is on a ventilator. EXAM: ABDOMEN ULTRASOUND COMPLETE COMPARISON:  01/16/2016 CT abdomen/pelvis. FINDINGS: Gallbladder: Mildly distended gallbladder contains layering calcified shadowing gallstones and a small amount of sludge with gallstones measuring up to 9 mm in size. No gallbladder wall thickening or pericholecystic fluid. Sonographic Murphy sign cannot be assessed on this ventilated patient. Common bile duct: Diameter: 5 mm Liver: No focal lesion identified. Within normal limits in parenchymal echogenicity. IVC: No abnormality visualized. Pancreas: Visualized portion unremarkable. Spleen: Size and appearance within normal limits. Right Kidney: Length: 11.4 cm. Mildly echogenic right kidney. No mass or hydronephrosis visualized. Left Kidney: Length: 12.1 cm. Mildly echogenic left kidney. No hydronephrosis. Nonobstructing 4 mm stone in Curtis mid to lower left kidney. Limited visualization of a 1.2 cm renal cyst in Curtis upper left kidney. Abdominal aorta: No aneurysm visualized. Other findings: Small left pleural effusion. No upper abdominal ascites. IMPRESSION: 1. Cholelithiasis. No gallbladder wall thickening. No pericholecystic fluid. Cannot assess sonographic Murphy's sign on this ventilated patient. No specific findings of acute cholecystitis. 2. No  biliary ductal dilatation. 3. Nonobstructing left renal stone.  No hydronephrosis. 4. Mildly echogenic kidneys, indicating nonspecific renal parenchymal disease of uncertain chronicity. 5. Small left pleural effusion. Electronically Signed   By: Curtis [facility] M.D.   On: 01/31/2016 20:09   Dg Chest Port 1 View  Result Date: 01/23/2016 CLINICAL DATA:  RIGHT femoral line placement EXAM: PORTABLE CHEST 1 VIEW COMPARISON:  Portable exam 1611 hours compared to 1228 hours FINDINGS: Tracheostomy tube and nasogastric tube unchanged. No femoral line identified. Prior thoracolumbar fusion. Stable heart size, mediastinal contours and pulmonary vascularity. Persistent pulmonary infiltrates bilaterally, basilar predominance, question edema  though infection not excluded. No pleural effusion or pneumothorax. Atherosclerotic calcification aorta. IMPRESSION: Persistent pulmonary infiltrates. RIGHT femoral line is not visualized. Electronically Signed   By: Curtis [facility] M.D.   On: 02/02/2016 16:33   Dg Chest Port 1 View  Result Date: 01/19/2016 CLINICAL DATA:  Status post bronchoscopy. EXAM: PORTABLE CHEST 1 VIEW COMPARISON:  Same day. FINDINGS: Stable cardiomediastinal silhouette. Tracheostomy and nasogastric tubes are unchanged in position. Status post surgical posterior fusion of lower thoracic and upper lumbar spine. No pneumothorax or significant pleural effusion is noted. Stable diffuse interstitial densities are noted throughout both lungs concerning for pulmonary edema. Moderate dextroscoliosis of lower thoracic and upper lumbar spine is noted. IMPRESSION: Stable support apparatus. Stable diffuse interstitial densities are noted in both lungs consistent with pulmonary edema. Electronically Signed   By: [facility], M.D.   On: 02/01/2016 12:50   Dg Chest Port 1 View  Result Date: 01/14/2016 CLINICAL DATA:  Respiratory failure. EXAM: PORTABLE CHEST 1 VIEW COMPARISON:  01/19/2016. FINDINGS: Interim removal of  feeding tube and replacement with NG tube. NG tube tip below left hemidiaphragm. Tracheostomy tube in stable position. Persistent pulmonary interstitial prominence suggesting congestive heart failure. Similar findings noted on prior exam. Low lung volumes with progressive bibasilar atelectasis. No pleural effusion or pneumothorax. IMPRESSION: 1. Interim removal of feeding tube and replacement with NG tube . Tracheostomy tube 2. Cardiomegaly with diffuse bilateral pulmonary interstitial prominence again noted. Findings suggest congestive heart failure. Similar findings noted on prior exam. 3.  Low lung volumes with progressive bibasilar atelectasis Electronically Signed   By: Curtis Moores  [facility]   On: 02/03/2016 07:40      DISCUSSION: 75 yo male with recent admit and dc from [facility] with baseline RA , steroid dependent, developed ARDS and required tracheostomy. Renal function was stage 3 but he did come off CVVH. Transferred to [facility] 8/9 and PCCM consulted on same day. He spent 12 hours on trach collar 8/10 but on 8/11 he was back on full vent support with rr 37 , severe air hunger and anxiety. FOB per [facility] with rt lower lobe with copious thick white secretions. Due ti his decline aand family wishes he will be transferred to [facility] and to ICU and treated for suspected HCAP.   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  O2 as needed. Sedated as needed Pulmonary toilet Mucomyst  Chest PT   CARDIOVASCULAR A:  CAD, Mod AS  =- septic shock since 02/05/2016; on pressors high dose  P:  Manage with femroal cvfl Fluid bolus additional 3L 01/22/2016   RENAL Lab Results  Component Value Date   CREATININE 2.35 (H) 01/22/2016   CREATININE 1.86 (H) 02/05/2016   CREATININE 1.85 (H) 01/18/2016   CREATININE 1.70 (H) 12/08/2015    Recent Labs Lab 01/20/2016 1447 01/24/2016 1859  01/22/16 0201  NA 148* 145 146*     A:   CKD stage 3 Recent CVVH stopped 8/3    - worsneing aKI P:   3L fluid bolus and reassess Avoid nephrotoxins Follow creatine  GASTROINTESTINAL A:   Elevated LFT - improved 8/12 P:   Hold tube feeds Check [place] of abd  HEMATOLOGIC A:   Anticoagulation for Hx of Afib,coumadin currently on hold P:  ? anticoagulation Follow platelets on Zyvox  INFECTIOUS A:   Presumed HAP P:  Transfer to [facility] 8/11 Start Zyflo and meropenem 8/11 and pan culture. GNR in sputum from 8/9  ENDOCRINE A:   No acute issues P:   Monitor glucose Stress steroids due to recent steroids  On steroids therefore will not check cortisol  NEUROLOGIC A:   Anxiety in setting of resp ditress P:   RASS goal:0 Fentanyl for vent tolerances   FAMILY  - Updates: Daughter, RN, S Minor ACNP, [facility] met and discussed care of [facility]. He will be transferred  to [facility] ICU for acute decline in resp status, presumed HAP and failure to thrive.  - Inter-disciplinary family meet or Palliative Care meeting due by:     Interdisciplinary Goals of Care Family Meeting    Date carried out:: 01/22/2016  Location of Curtis meeting: Bedside  Member's involved: Physician, Bedside Registered Nurse, Family Member or next of kin and Other: daugghter and grandson  Durable Power of Tour manager: wife  Discussion: We discussed goals of care for [facility]ier .  Full medical care + no cpr. No cardiversion,. Discussed sepsis, septich shock and worswening renal failure. Will rx with 3L fluid bolus and increased bic  Code status: Limited Code or DNR but full medical care   Disposition: Continue current acute care  Time spent for Curtis meeting: 5 min  C[facility] 01/22/2016 12:37 PM

## 2016-01-22 NOTE — Progress Notes (Signed)
ANTICOAGULATION CONSULT NOTE - Follow Up Consult  Pharmacy Consult for heparin Indication: atrial fibrillation   Labs:  Recent Labs  01/20/16 0540 01/22/2016 0550 01/13/2016 1447 01/13/2016 1859 01/13/2016 2033 02/06/2016 2338  HGB 10.0* 8.9* 9.4* 9.5*  --   --   HCT 32.8* 28.8* 31.1* 31.7*  --   --   PLT 149* 124* 148* 141*  --   --   APTT  --   --  31  --   --   --   LABPROT 14.7  --  15.7*  --   --   --   INR 1.15  --  1.24  --   --   --   HEPARINUNFRC  --   --   --   --   --  <0.10*  CREATININE 1.39* 1.21  1.21 1.85* 1.86*  --   --   TROPONINI  --   --  0.11*  --  0.08*  --      Assessment: 75yo male undetectable on heparin with initial dosing for Afib while Coumadin on hold.  Goal of Therapy:  Heparin level 0.3-0.7 units/ml   Plan:  Will rebolus with heparin 2000 units and increase gtt by 4 units/kg/hr to 1250 units/hr and check level in [place], PharmD, BCPS  01/22/2016,12:19 AM

## 2016-01-22 NOTE — Progress Notes (Signed)
PHARMACY - PHYSICIAN COMMUNICATION CRITICAL VALUE ALERT - BLOOD CULTURE IDENTIFICATION (BCID)  Results for orders placed or performed during the hospital encounter of 01/19/16  Blood Culture ID Panel (Reflexed) (Collected: 02/05/2016 10:00 AM)  Result Value Ref Range   Enterococcus species NOT DETECTED NOT DETECTED   Vancomycin resistance NOT DETECTED NOT DETECTED   Listeria monocytogenes NOT DETECTED NOT DETECTED   Staphylococcus species DETECTED (A) NOT DETECTED   Staphylococcus aureus DETECTED (A) NOT DETECTED   Methicillin resistance DETECTED (A) NOT DETECTED   Streptococcus species NOT DETECTED NOT DETECTED   Streptococcus agalactiae NOT DETECTED NOT DETECTED   Streptococcus pneumoniae NOT DETECTED NOT DETECTED   Streptococcus pyogenes NOT DETECTED NOT DETECTED   Acinetobacter baumannii NOT DETECTED NOT DETECTED   Enterobacteriaceae species NOT DETECTED NOT DETECTED   Enterobacter cloacae complex NOT DETECTED NOT DETECTED   Escherichia coli NOT DETECTED NOT DETECTED   Klebsiella oxytoca NOT DETECTED NOT DETECTED   Klebsiella pneumoniae NOT DETECTED NOT DETECTED   Proteus species NOT DETECTED NOT DETECTED   Serratia marcescens NOT DETECTED NOT DETECTED   Carbapenem resistance NOT DETECTED NOT DETECTED   Haemophilus influenzae NOT DETECTED NOT DETECTED   Neisseria meningitidis NOT DETECTED NOT DETECTED   Pseudomonas aeruginosa NOT DETECTED NOT DETECTED   Candida albicans NOT DETECTED NOT DETECTED   Candida glabrata NOT DETECTED NOT DETECTED   Candida krusei NOT DETECTED NOT DETECTED   Candida parapsilosis NOT DETECTED NOT DETECTED   Candida tropicalis NOT DETECTED NOT DETECTED    Name of physician (or Provider) Contacted: Dr Elsworth Soho  Changes to prescribed antibiotics required: On Merrem and Zyvox, no immediate changes needed.  Wynona Neat, PharmD, BCPS  01/22/2016  7:02 AM

## 2016-01-22 NOTE — Consult Note (Signed)
Date of Admission:  02/01/2016  Date of Consult:  01/22/2016  Reason for Consult: MRSA bactermia and septic shock, VAP,  ? intrabdominal infection Referring Physician: "[facility] auto consult"and Dr. Chase Caller   HPI: Curtis Frazier is an 75 y.o. male. baseline RA , steroid dependent, developed ARDS and required tracheostomy. He was at [facility] and being followed by my partner Dr. Baxter Flattery who was treating him for healthcare associated pneumonia with Zyvox and a carbapenem Patient also been on antifungals which were discontinued at that time. Renal function was stage 3 but he did come off CVVH. Transferred to [facility] 8/9 and PCCM consulted on same day. He spent 12 hours on trach collar 8/10 but on 8/11 he was back on full vent support with rr 37 , severe air hunger and anxiety. FOB per Dr. Rosina Lowenstein with rt lower lobe with copious thick white secretions. Due ti his decline aand family wishes he will be transferred to [facility] and to ICU and treated for suspected HCAP.   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 Actinic keratosis   . Arthritis    "hips" (12/15/2015)  . Bruises easily   . CAD (coronary artery disease)    a. 2010 s/p MI/Cath/PCI ([facility]): LM 10, LAD 30p, 34m D1 20, D2 30, LCX 30p, 995m3.5 x 15 Driver BMS), 6060FRCA 70p, 3036m. 04/2011 Neg MV;  c. 04/2015 Myoview: very mild apical thinning, no ischemia, EF 65%.  . 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 10/2015 Carotid US:[place] 1-39% heterogeneous plaque/stenosis. Nl subclavian arteries bilat.  . Chronic back pain    scoliosis/stenosis/radiculopathy,degenerative disc disease  . Chronic diastolic CHF (congestive heart failure) ([facility]  a. 08/2015 Echo: EF 55-60%, mild LVH, no rwma, Gr1 DD, mod AS, mildly dil LA,  PASP 44m53m  . CKD (chronic kidney disease), stage III    a. 09/2015 AKI in setting of dehydration - seen @ [facility].  . COMarland KitchenD (chronic obstructive pulmonary disease) (HCC)[place] 12/10/2015  . Degeneration of intervertebral disc, site unspecified   . Dyspnea on exertion   . Esophageal reflux    takes Omeprazole daily  . Gastritis 12/10  . GI bleed    a. 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  . Hemorrhoids   . History of blood transfusion 2010   "don't remember why"  . History of colonic polyps   . Hyperlipidemia    a. takes pravachol.  . Hypertensive heart disease    a. takes Metoprolol daily  . Joint pain   . Lumbar stenosis    a. s/p lumbar diskectomy 2017.  . Moderate aortic stenosis    a. 08/2015 Mod AS, valve area (VTI) - 1.5cm^2, (Vmax) - 1.25cm^2; b. 10/2015 TEE EF 55-60%, mod AS.  . NeMarland Kitchenhrolithiasis   . Other psoriasis   . Paroxysmal atrial fibrillation (HCC)[place] a. 06/2011 Post-op AF-->converted on amio;  b. 07/2015 TEE/unsuccessful DCCV x 2; c. 09/2015 recurrent AF/Flutter-->successful DCCV; d. 09/2015 Recurrent AF-->10/2015 s/p TEE (EF 55-60%, mod AS, mild MR, sev dil LA w/o LAA thrombus, sev dil RA, mild to mod TR) & DCCV (120 J x 1).  . Personal history of colonic polyps   .  PMR (polymyalgia rheumatica) (HCC) 01/20/2012   tx by specialist on a long prednisone taper  . Pneumonia 2008; 07/2015  . Psoriasis   . 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 Rheumatoid arthritis(714.0)    takes Methotrexate 7pills weekly  . Scoliosis (and kyphoscoliosis), idiopathic   . Skin cancer    "arms; they freeze then off; cut one off left elbow recently" (12/15/2015)  . Sleep apnea    "had it while in hospital in 07/2015" (12/15/2015)  . STEMI (ST elevation myocardial infarction) ([place]) 02/2010   Archie Endo 05/2009  . Type II diabetes mellitus ([place])     Past Surgical History:  Procedure Laterality Date  . BACK SURGERY    . CARDIAC CATHETERIZATION  2010  . CARDIOVERSION N/A 08/06/2015   Procedure: CARDIOVERSION;   Surgeon: Skeet Latch, MD;  Location: [place];  Service: Cardiovascular;  Laterality: N/A;  . CARDIOVERSION N/A 09/14/2015   Procedure: CARDIOVERSION;  Surgeon: Jerline Pain, MD;  Location: [place];  Service: Cardiovascular;  Laterality: N/A;  . CARDIOVERSION N/A 11/04/2015   Procedure: CARDIOVERSION;  Surgeon: Sanda Klein, MD;  Location: [facility];  Service: Cardiovascular;  Laterality: N/A;  . carotid doppler  10/12   0-39% R and 60-79% left   . CATARACT EXTRACTION W/ INTRAOCULAR LENS  IMPLANT, BILATERAL Bilateral 2012  . COLONOSCOPY    . CORONARY ANGIOPLASTY WITH STENT PLACEMENT  02/2009   has one stent  . ELECTROPHYSIOLOGIC STUDY N/A 12/15/2015   Procedure: A-Flutter;  Surgeon: Will Meredith Leeds, MD;  Location: [place] CV LAB;  Service: Cardiovascular;  Laterality: N/A;  . LAMINECTOMY THORACIC FUSION POSTERIOR  06/2011   Decompressive laminectomy at L12, L2-3, L4-5; posterior segmental agitation T10-L5 with globus titanium pedicle screws and rods; T10-11, and T11-12, T12-L1, L1-2, L2-3, L3-4, L4-5 posterior lateral arthrodesis with local morselized autograft bone, bone morphogenic protein, Actifuse bone graft extender./notes 07/03/2011  . LITHOTRIPSY  2009  . LUMBAR LAMINECTOMY  07/2015  . POSTERIOR LAMINECTOMY THORACIC SPINE     "fixed the discs"  . SHOULDER OPEN ROTATOR CUFF REPAIR Left 2000  . SKIN CANCER EXCISION     "arms"  . TEE WITHOUT CARDIOVERSION N/A 08/06/2015   Procedure: TRANSESOPHAGEAL ECHOCARDIOGRAM (TEE);  Surgeon: Skeet Latch, MD;  Location: [place];  Service: Cardiovascular;  Laterality: N/A;  . TEE WITHOUT CARDIOVERSION N/A 09/14/2015   Procedure: TRANSESOPHAGEAL ECHOCARDIOGRAM (TEE);  Surgeon: Jerline Pain, MD;  Location: [place];  Service: Cardiovascular;  Laterality: N/A;  . TEE WITHOUT CARDIOVERSION N/A 11/04/2015   Procedure: TRANSESOPHAGEAL ECHOCARDIOGRAM (TEE);  Surgeon: Sanda Klein, MD;  Location: [facility] ENDOSCOPY;  Service:  Cardiovascular;  Laterality: N/A;  . VASECTOMY  1979    Social History:  reports that he quit smoking about 7 years ago. His smoking use included Cigarettes. He has a 79.50 pack-year smoking history. He has never used smokeless tobacco. He reports that he drinks alcohol. He reports that he does not use drugs.   Family History  Problem Relation Age of Onset  . Coronary artery disease Mother   . Diabetes Mother   . CAD Mother   . Coronary artery disease Sister   . Hypertension Sister   . Kidney failure Sister   . CAD Sister   . Diabetes Brother   . Prostate cancer Brother   . Pancreatic cancer Brother   . Diabetes Brother   . Anesthesia problems Neg Hx   . Hypotension Neg Hx   . Malignant hyperthermia Neg Hx   . Pseudochol deficiency  Neg Hx     Allergies  Allergen Reactions  . Ace Inhibitors Other (See Comments)    Causes high K   . Angiotensin Receptor Blockers Other (See Comments)    Causes high K   . Metoprolol Other (See Comments)    May cause elevated K level   . Penicillins Swelling and Rash    Has patient had a PCN reaction causing immediate rash, facial/tongue/throat swelling, SOB or lightheadedness with hypotension: YES Has patient had a PCN reaction causing severe rash involving mucus membranes or skin necrosis: NO Has patient had a PCN reaction that required hospitalization NO Has patient had a PCN reaction occurring within the last 10 years: NO If all of the above answers are "NO", then may proceed with Cephalosporin use. Pt has tolerated cephalosporins in the past   . Amiodarone Other (See Comments)    12/27/15: Cardiologist discontinued the amiodarone (on PTA) due to  concern for toxicity  (in hospitial with acute hypoxemic respiratory failure)   . Tetracycline Swelling and Rash     Medications: I have reviewed patients current medications as documented in Epic Anti-infectives    Start     Dose/Rate Route Frequency Ordered Stop   01/11/2016 1500   linezolid (ZYVOX) IVPB 600 mg     600 mg 300 mL/hr over 60 Minutes Intravenous Every 12 hours 02/10/2016 1402     01/15/2016 1500  meropenem (MERREM) 500 mg in sodium chloride 0.9 % 50 mL IVPB  Status:  Discontinued     500 mg 100 mL/hr over 30 Minutes Intravenous Every 8 hours 01/31/2016 1402 01/20/2016 1407   01/22/2016 1500  meropenem (MERREM) 1 g in sodium chloride 0.9 % 100 mL IVPB     1 g 200 mL/hr over 30 Minutes Intravenous Every 12 hours 02/10/2016 1407           ROS:  as in HPI otherwise remainder of 12 point Review of Systems is not obtainable due to patient's confusion   Blood pressure (!) 107/48, pulse 94, temperature 98.5 F (36.9 C), temperature source Oral, resp. rate (!) 27, weight 150 lb 12.7 oz (68.4 kg), SpO2 95 %. General: Trached on the ventilator response to pain  HEENT: anicteric sclera,   Cardiovascular: egular rate, normal r,  no murmur rubs or gallops Pulmonary: Coarse breath sounds Gastrointestinal: He is exquisitely tender and grimaced and moved his head for when I placed a stethoscope on his abdomen, reduce bowel sounds Musculoskeletal: restraints Skin, ecchymoses  Neuro: nonfocal   Results for orders placed or performed during the hospital encounter of 01/26/2016 (from the past 48 hour(s))  Urine culture     Status: None   Collection Time: 01/24/2016  2:34 PM  Result Value Ref Range   Specimen Description URINE, CATHETERIZED    Special Requests NONE    Culture NO GROWTH    Report Status 01/22/2016 FINAL   Strep pneumoniae urinary antigen     Status: None   Collection Time: 02/08/2016  2:34 PM  Result Value Ref Range   Strep Pneumo Urinary Antigen NEGATIVE NEGATIVE    Comment:        Infection due to S. pneumoniae cannot be absolutely ruled out since the antigen present may be below the detection limit of the test.   Culture, blood (routine x 2)     Status: None (Preliminary result)   Collection Time: 01/19/2016  2:45 PM  Result Value Ref Range    Specimen Description BLOOD RIGHT HAND  Special Requests IN PEDIATRIC BOTTLE 1CC    Culture  Setup Time      GRAM POSITIVE COCCI IN CLUSTERS AEROBIC BOTTLE ONLY CRITICAL VALUE NOTED.  VALUE IS CONSISTENT WITH PREVIOUSLY REPORTED AND CALLED VALUE.    Culture GRAM POSITIVE COCCI    Report Status PENDING   Troponin I (q 6hr x 3)     Status: Abnormal   Collection Time: 01/20/2016  2:47 PM  Result Value Ref Range   Troponin I 0.11 (HH) <0.03 ng/mL    Comment: CRITICAL RESULT CALLED TO, READ BACK BY AND VERIFIED WITH: L ROBBINS,RN 1551 01/19/2016 D BRADLEY   Procalcitonin - Baseline     Status: None   Collection Time: 01/16/2016  2:47 PM  Result Value Ref Range   Procalcitonin 6.68 ng/mL    Comment:        Interpretation: PCT > 2 ng/mL: Systemic infection (sepsis) is likely, unless other causes are known. (NOTE)         ICU PCT Algorithm               Non ICU PCT Algorithm    ----------------------------     ------------------------------         PCT < 0.25 ng/mL                 PCT < 0.1 ng/mL     Stopping of antibiotics            Stopping of antibiotics       strongly encouraged.               strongly encouraged.    ----------------------------     ------------------------------       PCT level decrease by               PCT < 0.25 ng/mL       >= 80% from peak PCT       OR PCT 0.25 - 0.5 ng/mL          Stopping of antibiotics                                             encouraged.     Stopping of antibiotics           encouraged.    ----------------------------     ------------------------------       PCT level decrease by              PCT >= 0.25 ng/mL       < 80% from peak PCT        AND PCT >= 0.5 ng/mL            Continuing antibiotics                                               encouraged.       Continuing antibiotics            encouraged.    ----------------------------     ------------------------------     PCT level increase compared          PCT > 0.5 ng/mL          with peak PCT AND  PCT >= 0.5 ng/mL             Escalation of antibiotics                                          strongly encouraged.      Escalation of antibiotics        strongly encouraged.   Comprehensive metabolic panel     Status: Abnormal   Collection Time: 02/03/2016  2:47 PM  Result Value Ref Range   Sodium 148 (H) 135 - 145 mmol/L   Potassium 3.8 3.5 - 5.1 mmol/L   Chloride 119 (H) 101 - 111 mmol/L   CO2 24 22 - 32 mmol/L   Glucose, Bld 284 (H) 65 - 99 mg/dL   BUN 44 (H) 6 - 20 mg/dL   Creatinine, Ser 1.85 (H) 0.61 - 1.24 mg/dL   Calcium 7.7 (L) 8.9 - 10.3 mg/dL   Total Protein 4.4 (L) 6.5 - 8.1 g/dL   Albumin 1.4 (L) 3.5 - 5.0 g/dL   AST 156 (H) 15 - 41 U/L   ALT 230 (H) 17 - 63 U/L   Alkaline Phosphatase 688 (H) 38 - 126 U/L   Total Bilirubin 1.1 0.3 - 1.2 mg/dL   GFR calc non Af Amer 34 (L) >60 mL/min   GFR calc Af Amer 39 (L) >60 mL/min    Comment: (NOTE) The eGFR has been calculated using the CKD EPI equation. This calculation has not been validated in all clinical situations. eGFR's persistently <60 mL/min signify possible Chronic Kidney Disease.    Anion gap 5 5 - 15  Magnesium     Status: None   Collection Time: 01/19/2016  2:47 PM  Result Value Ref Range   Magnesium 1.7 1.7 - 2.4 mg/dL  Phosphorus     Status: None   Collection Time: 01/22/2016  2:47 PM  Result Value Ref Range   Phosphorus 4.0 2.5 - 4.6 mg/dL  CBC     Status: Abnormal   Collection Time: 01/26/2016  2:47 PM  Result Value Ref Range   WBC 17.3 (H) 4.0 - 10.5 K/uL   RBC 3.15 (L) 4.22 - 5.81 MIL/uL   Hemoglobin 9.4 (L) 13.0 - 17.0 g/dL   HCT 31.1 (L) 39.0 - 52.0 %   MCV 98.7 78.0 - 100.0 fL   MCH 29.8 26.0 - 34.0 pg   MCHC 30.2 30.0 - 36.0 g/dL   RDW 20.1 (H) 11.5 - 15.5 %   Platelets 148 (L) 150 - 400 K/uL  Protime-INR     Status: Abnormal   Collection Time: 02/05/2016  2:47 PM  Result Value Ref Range   Prothrombin Time 15.7 (H) 11.4 - 15.2 seconds   INR 1.24   APTT     Status:  None   Collection Time: 01/24/2016  2:47 PM  Result Value Ref Range   aPTT 31 24 - 36 seconds  Amylase     Status: Abnormal   Collection Time: 01/30/2016  2:47 PM  Result Value Ref Range   Amylase 125 (H) 28 - 100 U/L  Lipase, blood     Status: Abnormal   Collection Time: 02/06/2016  2:47 PM  Result Value Ref Range   Lipase 96 (H) 11 - 51 U/L  Lactic acid, plasma     Status: Abnormal   Collection Time: 01/18/2016  2:47 PM  Result Value  Ref Range   Lactic Acid, Venous 2.1 (HH) 0.5 - 1.9 mmol/L    Comment: CRITICAL RESULT CALLED TO, READ BACK BY AND VERIFIED WITH: L ROBBINS,RN 1549 01/18/2016 D BRADLEY   Culture, blood (routine x 2)     Status: None (Preliminary result)   Collection Time: 01/20/2016  2:52 PM  Result Value Ref Range   Specimen Description BLOOD RIGHT ANTECUBITAL    Special Requests IN PEDIATRIC BOTTLE 2CC    Culture  Setup Time      GRAM POSITIVE COCCI IN CLUSTERS AEROBIC BOTTLE ONLY CRITICAL RESULT CALLED TO, READ BACK BY AND VERIFIED WITH: C BALL,PHARMD AT 1001 01/22/16 BY L BENFIELD    Culture GRAM POSITIVE COCCI    Report Status PENDING   Type and screen [place]     Status: None   Collection Time: 02/10/2016  2:53 PM  Result Value Ref Range   ABO/RH(D) A NEG    Antibody Screen NEG    Sample Expiration 01/24/2016   Procalcitonin - Baseline     Status: None   Collection Time: 01/16/2016  6:59 PM  Result Value Ref Range   Procalcitonin 28.30 ng/mL    Comment:        Interpretation: PCT >= 10 ng/mL: Important systemic inflammatory response, almost exclusively due to severe bacterial sepsis or septic shock. (NOTE)         ICU PCT Algorithm               Non ICU PCT Algorithm    ----------------------------     ------------------------------         PCT < 0.25 ng/mL                 PCT < 0.1 ng/mL     Stopping of antibiotics            Stopping of antibiotics       strongly encouraged.               strongly encouraged.     ----------------------------     ------------------------------       PCT level decrease by               PCT < 0.25 ng/mL       >= 80% from peak PCT       OR PCT 0.25 - 0.5 ng/mL          Stopping of antibiotics                                             encouraged.     Stopping of antibiotics           encouraged.    ----------------------------     ------------------------------       PCT level decrease by              PCT >= 0.25 ng/mL       < 80% from peak PCT        AND PCT >= 0.5 ng/mL             Continuing antibiotics  encouraged.       Continuing antibiotics            encouraged.    ----------------------------     ------------------------------     PCT level increase compared          PCT > 0.5 ng/mL         with peak PCT AND          PCT >= 0.5 ng/mL             Escalation of antibiotics                                          strongly encouraged.      Escalation of antibiotics        strongly encouraged.   Comprehensive metabolic panel     Status: Abnormal   Collection Time: 01/30/2016  6:59 PM  Result Value Ref Range   Sodium 145 135 - 145 mmol/L   Potassium 4.5 3.5 - 5.1 mmol/L   Chloride 120 (H) 101 - 111 mmol/L   CO2 21 (L) 22 - 32 mmol/L   Glucose, Bld 320 (H) 65 - 99 mg/dL   BUN 47 (H) 6 - 20 mg/dL   Creatinine, Ser 1.86 (H) 0.61 - 1.24 mg/dL   Calcium 7.2 (L) 8.9 - 10.3 mg/dL   Total Protein 4.2 (L) 6.5 - 8.1 g/dL   Albumin 1.3 (L) 3.5 - 5.0 g/dL   AST 148 (H) 15 - 41 U/L   ALT 216 (H) 17 - 63 U/L   Alkaline Phosphatase 661 (H) 38 - 126 U/L   Total Bilirubin 1.1 0.3 - 1.2 mg/dL   GFR calc non Af Amer 34 (L) >60 mL/min   GFR calc Af Amer 39 (L) >60 mL/min    Comment: (NOTE) The eGFR has been calculated using the CKD EPI equation. This calculation has not been validated in all clinical situations. eGFR's persistently <60 mL/min signify possible Chronic Kidney Disease.    Anion gap 4 (L) 5 - 15  CBC with  Differential/Platelet     Status: Abnormal   Collection Time: 02/03/2016  6:59 PM  Result Value Ref Range   WBC 17.5 (H) 4.0 - 10.5 K/uL   RBC 3.15 (L) 4.22 - 5.81 MIL/uL   Hemoglobin 9.5 (L) 13.0 - 17.0 g/dL   HCT 31.7 (L) 39.0 - 52.0 %   MCV 100.6 (H) 78.0 - 100.0 fL   MCH 30.2 26.0 - 34.0 pg   MCHC 30.0 30.0 - 36.0 g/dL   RDW 20.0 (H) 11.5 - 15.5 %   Platelets 141 (L) 150 - 400 K/uL   Neutrophils Relative % 95 %   Lymphocytes Relative 3 %   Monocytes Relative 2 %   Eosinophils Relative 0 %   Basophils Relative 0 %   Neutro Abs 16.6 (H) 1.7 - 7.7 K/uL   Lymphs Abs 0.5 (L) 0.7 - 4.0 K/uL   Monocytes Absolute 0.4 0.1 - 1.0 K/uL   Eosinophils Absolute 0.0 0.0 - 0.7 K/uL   Basophils Absolute 0.0 0.0 - 0.1 K/uL   WBC Morphology FEW NEUTROPHIL BANDS NOTED    Smear Review FEW PLATELET CLUMPS NOTED   Lactic acid, plasma     Status: None   Collection Time: 01/23/2016  7:00 PM  Result Value Ref Range   Lactic Acid, Venous 1.8 0.5 - 1.9 mmol/L  Troponin I (q 6hr x 3)     Status: Abnormal   Collection Time: 01/24/2016  8:33 PM  Result Value Ref Range   Troponin I 0.08 (HH) <0.03 ng/mL    Comment: CRITICAL VALUE NOTED.  VALUE IS CONSISTENT WITH PREVIOUSLY REPORTED AND CALLED VALUE.  Blood gas, arterial     Status: Abnormal   Collection Time: 02/06/2016 10:33 PM  Result Value Ref Range   FIO2 100.00    Delivery systems VENTILATOR    Mode PRESSURE REGULATED VOLUME CONTROL    VT 500 mL   LHR 28 resp/min   Peep/cpap 10.0 cm H20   pH, Arterial 7.198 (LL) 7.350 - 7.450    Comment: CRITICAL RESULT CALLED TO, READ BACK BY AND VERIFIED WITH: JEN MOTLEY,RN AT 2242 BY APRIL WHITE,RRT ON 01/25/2016    pCO2 arterial 48.0 (H) 35.0 - 45.0 mmHg   pO2, Arterial 96.6 80.0 - 100.0 mmHg   Bicarbonate 17.8 (L) 20.0 - 24.0 mEq/L   TCO2 19.2 0 - 100 mmol/L   Acid-base deficit 8.7 (H) 0.0 - 2.0 mmol/L   O2 Saturation 94.8 %   Patient temperature 99.7    Collection site ARTERIAL LINE    Drawn by [id number]     Sample type ARTERIAL DRAW    Allens test (pass/fail) PASS PASS  Cortisol     Status: None   Collection Time: 01/17/2016 11:38 PM  Result Value Ref Range   Cortisol, Plasma 79.4 ug/dL    Comment: RESULTS CONFIRMED BY MANUAL DILUTION (NOTE) AM    6.7 - 22.6 ug/dL PM   <10.0       ug/dL   Heparin level (unfractionated)     Status: Abnormal   Collection Time: 01/19/2016 11:38 PM  Result Value Ref Range   Heparin Unfractionated <0.10 (L) 0.30 - 0.70 IU/mL    Comment:        IF HEPARIN RESULTS ARE BELOW EXPECTED VALUES, AND PATIENT DOSAGE HAS BEEN CONFIRMED, SUGGEST FOLLOW UP TESTING OF ANTITHROMBIN III LEVELS. REPEATED TO VERIFY   Blood gas, arterial     Status: Abnormal   Collection Time: 01/22/16  1:55 AM  Result Value Ref Range   FIO2 100.00    Delivery systems VENTILATOR    Mode PRESSURE REGULATED VOLUME CONTROL    VT 500 mL   LHR 28 resp/min   Peep/cpap 10.0 cm H20   pH, Arterial 7.191 (LL) 7.350 - 7.450    Comment: CRITICAL RESULT CALLED TO, READ BACK BY AND VERIFIED WITH: JEN MOTTEY RN AT 0200 BY APRIL WHITE RRT,RCP ON 01/22/2016    pCO2 arterial 47.0 (H) 35.0 - 45.0 mmHg   pO2, Arterial 111 (H) 80.0 - 100.0 mmHg   Bicarbonate 17.1 (L) 20.0 - 24.0 mEq/L   TCO2 18.5 0 - 100 mmol/L   Acid-base deficit 9.5 (H) 0.0 - 2.0 mmol/L   O2 Saturation 96.5 %   Patient temperature 99.7    Collection site A-LINE    Drawn by [id number]    Sample type ARTERIAL DRAW    Allens test (pass/fail) PASS PASS  Procalcitonin     Status: None   Collection Time: 01/22/16  2:01 AM  Result Value Ref Range   Procalcitonin 90.54 ng/mL    Comment:        Interpretation: PCT >= 10 ng/mL: Important systemic inflammatory response, almost exclusively due to severe bacterial sepsis or septic shock. (NOTE)         ICU PCT Algorithm  Non ICU PCT Algorithm    ----------------------------     ------------------------------         PCT < 0.25 ng/mL                 PCT < 0.1 ng/mL      Stopping of antibiotics            Stopping of antibiotics       strongly encouraged.               strongly encouraged.    ----------------------------     ------------------------------       PCT level decrease by               PCT < 0.25 ng/mL       >= 80% from peak PCT       OR PCT 0.25 - 0.5 ng/mL          Stopping of antibiotics                                             encouraged.     Stopping of antibiotics           encouraged.    ----------------------------     ------------------------------       PCT level decrease by              PCT >= 0.25 ng/mL       < 80% from peak PCT        AND PCT >= 0.5 ng/mL             Continuing antibiotics                                              encouraged.       Continuing antibiotics            encouraged.    ----------------------------     ------------------------------     PCT level increase compared          PCT > 0.5 ng/mL         with peak PCT AND          PCT >= 0.5 ng/mL             Escalation of antibiotics                                          strongly encouraged.      Escalation of antibiotics        strongly encouraged.   Basic metabolic panel     Status: Abnormal   Collection Time: 01/22/16  2:01 AM  Result Value Ref Range   Sodium 146 (H) 135 - 145 mmol/L   Potassium 5.1 3.5 - 5.1 mmol/L   Chloride 115 (H) 101 - 111 mmol/L   CO2 19 (L) 22 - 32 mmol/L   Glucose, Bld 413 (H) 65 - 99 mg/dL   BUN 54 (H) 6 - 20 mg/dL   Creatinine, Ser 2.35 (H) 0.61 - 1.24 mg/dL   Calcium 7.1 (L) 8.9 - 10.3 mg/dL   GFR calc non Af Amer 25 (  L) >60 mL/min   GFR calc Af Amer 29 (L) >60 mL/min    Comment: (NOTE) The eGFR has been calculated using the CKD EPI equation. This calculation has not been validated in all clinical situations. eGFR's persistently <60 mL/min signify possible Chronic Kidney Disease.    Anion gap 12 5 - 15  Magnesium     Status: None   Collection Time: 01/22/16  2:01 AM  Result Value Ref Range   Magnesium  1.7 1.7 - 2.4 mg/dL  Phosphorus     Status: Abnormal   Collection Time: 01/22/16  2:01 AM  Result Value Ref Range   Phosphorus 7.2 (H) 2.5 - 4.6 mg/dL  CBC     Status: Abnormal   Collection Time: 01/22/16  2:01 AM  Result Value Ref Range   WBC 18.3 (H) 4.0 - 10.5 K/uL   RBC 3.13 (L) 4.22 - 5.81 MIL/uL   Hemoglobin 9.3 (L) 13.0 - 17.0 g/dL   HCT 31.5 (L) 39.0 - 52.0 %   MCV 100.6 (H) 78.0 - 100.0 fL   MCH 29.7 26.0 - 34.0 pg   MCHC 29.5 (L) 30.0 - 36.0 g/dL   RDW 20.1 (H) 11.5 - 15.5 %   Platelets 138 (L) 150 - 400 K/uL  Blood gas, arterial     Status: Abnormal   Collection Time: 01/22/16  4:12 AM  Result Value Ref Range   FIO2 100.00    Delivery systems VENTILATOR    Mode PRESSURE REGULATED VOLUME CONTROL    VT 500 mL   LHR 28 resp/min   Peep/cpap 10.0 cm H20   pH, Arterial 7.196 (LL) 7.350 - 7.450    Comment: CRITICAL RESULT CALLED TO, READ BACK BY AND VERIFIED WITH: JEN MOTLEY, RN CALLED AT 04:17 BY APRIL WHITE, RRT ON 01/30/2016    pCO2 arterial 46.9 (H) 35.0 - 45.0 mmHg   pO2, Arterial 125 (H) 80.0 - 100.0 mmHg   Bicarbonate 17.5 (L) 20.0 - 24.0 mEq/L   TCO2 18.9 0 - 100 mmol/L   Acid-base deficit 9.2 (H) 0.0 - 2.0 mmol/L   O2 Saturation 97.5 %   Patient temperature 98.4    Collection site A-LINE    Drawn by [id number]    Sample type ARTERIAL DRAW    Allens test (pass/fail) PASS PASS  Troponin I (q 6hr x 3)     Status: Abnormal   Collection Time: 01/22/16  4:15 AM  Result Value Ref Range   Troponin I 0.06 (HH) <0.03 ng/mL    Comment: CRITICAL VALUE NOTED.  VALUE IS CONSISTENT WITH PREVIOUSLY REPORTED AND CALLED VALUE.  Heparin level (unfractionated)     Status: Abnormal   Collection Time: 01/22/16 10:10 AM  Result Value Ref Range   Heparin Unfractionated <0.10 (L) 0.30 - 0.70 IU/mL    Comment:        IF HEPARIN RESULTS ARE BELOW EXPECTED VALUES, AND PATIENT DOSAGE HAS BEEN CONFIRMED, SUGGEST FOLLOW UP TESTING OF ANTITHROMBIN III LEVELS.     _0 (sdes,specrequest,cult,reptstatus)   ) Recent Results (from the past 720 hour(s))  Culture, sputum-assessment     Status: None   Collection Time: 12/23/15  5:15 PM  Result Value Ref Range Status   Specimen Description SPUTUM  Final   Special Requests NONE  Final   Sputum evaluation   Final    MICROSCOPIC FINDINGS SUGGEST THAT THIS SPECIMEN IS NOT REPRESENTATIVE OF LOWER RESPIRATORY SECRETIONS. PLEASE RECOLLECT. Deloris Ping RN [id number] 12/23/15 A BROWNING    Report  Status 12/23/2015 FINAL  Final  Gram stain     Status: None   Collection Time: 12/24/15 11:25 PM  Result Value Ref Range Status   Specimen Description EXPECTORATED SPUTUM  Final   Special Requests NONE  Final   Gram Stain   Final    MODERATE WBC PRESENT,BOTH PMN AND MONONUCLEAR RARE SQUAMOUS EPITHELIAL CELLS PRESENT FEW GRAM POSITIVE COCCI FEW GRAM VARIABLE ROD    Report Status 12/25/2015 FINAL  Final  Respiratory Panel by PCR     Status: None   Collection Time: 12/25/15  9:45 AM  Result Value Ref Range Status   Adenovirus NOT DETECTED NOT DETECTED Final   Coronavirus 229E NOT DETECTED NOT DETECTED Final   Coronavirus HKU1 NOT DETECTED NOT DETECTED Final   Coronavirus NL63 NOT DETECTED NOT DETECTED Final   Coronavirus OC43 NOT DETECTED NOT DETECTED Final   Metapneumovirus NOT DETECTED NOT DETECTED Final   Rhinovirus / Enterovirus NOT DETECTED NOT DETECTED Final   Influenza A NOT DETECTED NOT DETECTED Final   Influenza A H1 NOT DETECTED NOT DETECTED Final   Influenza A H1 2009 NOT DETECTED NOT DETECTED Final   Influenza A H3 NOT DETECTED NOT DETECTED Final   Influenza B NOT DETECTED NOT DETECTED Final   Parainfluenza Virus 1 NOT DETECTED NOT DETECTED Final   Parainfluenza Virus 2 NOT DETECTED NOT DETECTED Final   Parainfluenza Virus 3 NOT DETECTED NOT DETECTED Final   Parainfluenza Virus 4 NOT DETECTED NOT DETECTED Final   Respiratory Syncytial Virus NOT DETECTED NOT DETECTED Final    Bordetella pertussis NOT DETECTED NOT DETECTED Final   Chlamydophila pneumoniae NOT DETECTED NOT DETECTED Final   Mycoplasma pneumoniae NOT DETECTED NOT DETECTED Final  MRSA PCR Screening     Status: None   Collection Time: 01/03/16  5:54 PM  Result Value Ref Range Status   MRSA by PCR NEGATIVE NEGATIVE Final    Comment:        The GeneXpert MRSA Assay (FDA approved for NASAL specimens only), is one component of a comprehensive MRSA colonization surveillance program. It is not intended to diagnose MRSA infection nor to guide or monitor treatment for MRSA infections.   Blood Culture (routine x 2)     Status: None   Collection Time: 01/03/16  6:44 PM  Result Value Ref Range Status   Specimen Description BLOOD LEFT ANTECUBITAL  Final   Special Requests BOTTLES DRAWN AEROBIC AND ANAEROBIC 5CC  Final   Culture   Final    NO GROWTH 5 DAYS Performed at [facility]    Report Status 01/08/2016 FINAL  Final  Blood Culture (routine x 2)     Status: Abnormal   Collection Time: 01/03/16  6:50 PM  Result Value Ref Range Status   Specimen Description BLOOD LEFT HAND  Final   Special Requests BOTTLES DRAWN AEROBIC AND ANAEROBIC 5CC  Final   Culture  Setup Time   Final    GRAM POSITIVE COCCI IN CLUSTERS AEROBIC BOTTLE ONLY CRITICAL RESULT CALLED TO, READ BACK BY AND VERIFIED WITH: E. JACKSON, [facility] PHARMD AT [place] ON 01/04/16 BY C. JESSUP, MLT.    Culture (A)  Final    STAPHYLOCOCCUS SPECIES (COAGULASE NEGATIVE) THE SIGNIFICANCE OF ISOLATING THIS ORGANISM FROM A SINGLE SET OF BLOOD CULTURES WHEN MULTIPLE SETS ARE DRAWN IS UNCERTAIN. PLEASE NOTIFY THE MICROBIOLOGY DEPARTMENT WITHIN ONE WEEK IF SPECIATION AND SENSITIVITIES ARE REQUIRED. Performed at [facility]    Report Status 01/05/2016 FINAL  Final  Blood Culture ID Panel (Reflexed)     Status: Abnormal   Collection Time: 01/03/16  6:50 PM  Result Value Ref Range Status   Enterococcus species NOT DETECTED NOT DETECTED Final     Vancomycin resistance NOT DETECTED NOT DETECTED Final   Listeria monocytogenes NOT DETECTED NOT DETECTED Final   Staphylococcus species DETECTED (A) NOT DETECTED Final    Comment: CRITICAL RESULT CALLED TO, READ BACK BY AND VERIFIED WITH: Adrian, [facility] PHARMD AT 1840 ON 01/04/16 BY C. JESSUP, MLT.    Staphylococcus aureus NOT DETECTED NOT DETECTED Final   Methicillin resistance NOT DETECTED NOT DETECTED Final   Streptococcus species NOT DETECTED NOT DETECTED Final   Streptococcus agalactiae NOT DETECTED NOT DETECTED Final   Streptococcus pneumoniae NOT DETECTED NOT DETECTED Final   Streptococcus pyogenes NOT DETECTED NOT DETECTED Final   Acinetobacter baumannii NOT DETECTED NOT DETECTED Final   Enterobacteriaceae species NOT DETECTED NOT DETECTED Final   Enterobacter cloacae complex NOT DETECTED NOT DETECTED Final   Escherichia coli NOT DETECTED NOT DETECTED Final   Klebsiella oxytoca NOT DETECTED NOT DETECTED Final   Klebsiella pneumoniae NOT DETECTED NOT DETECTED Final   Proteus species NOT DETECTED NOT DETECTED Final   Serratia marcescens NOT DETECTED NOT DETECTED Final   Carbapenem resistance NOT DETECTED NOT DETECTED Final   Haemophilus influenzae NOT DETECTED NOT DETECTED Final   Neisseria meningitidis NOT DETECTED NOT DETECTED Final   Pseudomonas aeruginosa NOT DETECTED NOT DETECTED Final   Candida albicans NOT DETECTED NOT DETECTED Final   Candida glabrata NOT DETECTED NOT DETECTED Final   Candida krusei NOT DETECTED NOT DETECTED Final   Candida parapsilosis NOT DETECTED NOT DETECTED Final   Candida tropicalis NOT DETECTED NOT DETECTED Final    Comment: Performed at [facility]  Culture, respiratory (NON-Expectorated)     Status: None   Collection Time: 01/03/16  7:52 PM  Result Value Ref Range Status   Specimen Description THROAT  Final   Special Requests NONE  Final   Gram Stain   Final    ABUNDANT WBC PRESENT,BOTH PMN AND MONONUCLEAR RARE GRAM POSITIVE COCCI  IN PAIRS RARE GRAM VARIABLE ROD RARE YEAST    Culture   Final    Consistent with normal respiratory flora. Performed at [facility]    Report Status 01/06/2016 FINAL  Final  Culture, blood (routine x 2)     Status: None   Collection Time: 01/03/16  8:45 PM  Result Value Ref Range Status   Specimen Description BLOOD RIGHT ANTECUBITAL  Final   Special Requests BOTTLES DRAWN AEROBIC AND ANAEROBIC 5CC  Final   Culture   Final    NO GROWTH 5 DAYS Performed at [facility]    Report Status 01/08/2016 FINAL  Final  Culture, blood (routine x 2)     Status: None   Collection Time: 01/03/16  8:49 PM  Result Value Ref Range Status   Specimen Description BLOOD RIGHT HAND  Final   Special Requests BOTTLES DRAWN AEROBIC ONLY 5CC  Final   Culture   Final    NO GROWTH 5 DAYS Performed at [facility]    Report Status 01/08/2016 FINAL  Final  Urine culture     Status: None   Collection Time: 01/03/16 11:15 PM  Result Value Ref Range Status   Specimen Description URINE, CATHETERIZED  Final   Special Requests NONE  Final   Culture NO GROWTH Performed at [facility]   Final  Report Status 01/05/2016 FINAL  Final  Respiratory Panel by PCR     Status: None   Collection Time: 01/03/16 11:20 PM  Result Value Ref Range Status   Adenovirus NOT DETECTED NOT DETECTED Final   Coronavirus 229E NOT DETECTED NOT DETECTED Final   Coronavirus HKU1 NOT DETECTED NOT DETECTED Final   Coronavirus NL63 NOT DETECTED NOT DETECTED Final   Coronavirus OC43 NOT DETECTED NOT DETECTED Final   Metapneumovirus NOT DETECTED NOT DETECTED Final   Rhinovirus / Enterovirus NOT DETECTED NOT DETECTED Final   Influenza A NOT DETECTED NOT DETECTED Final   Influenza A H1 NOT DETECTED NOT DETECTED Final   Influenza A H1 2009 NOT DETECTED NOT DETECTED Final   Influenza A H3 NOT DETECTED NOT DETECTED Final   Influenza B NOT DETECTED NOT DETECTED Final   Parainfluenza Virus 1 NOT DETECTED NOT  DETECTED Final   Parainfluenza Virus 2 NOT DETECTED NOT DETECTED Final   Parainfluenza Virus 3 NOT DETECTED NOT DETECTED Final   Parainfluenza Virus 4 NOT DETECTED NOT DETECTED Final   Respiratory Syncytial Virus NOT DETECTED NOT DETECTED Final   Bordetella pertussis NOT DETECTED NOT DETECTED Final   Chlamydophila pneumoniae NOT DETECTED NOT DETECTED Final   Mycoplasma pneumoniae NOT DETECTED NOT DETECTED Final    Comment: Performed at [facility]  Culture, respiratory (NON-Expectorated)     Status: None   Collection Time: 01/06/16  9:45 AM  Result Value Ref Range Status   Specimen Description TRACHEAL ASPIRATE  Final   Special Requests Normal  Final   Gram Stain   Final    NO WBC SEEN RARE GRAM POSITIVE COCCI IN PAIRS AND CHAINS RARE YEAST RARE SQUAMOUS EPITHELIAL CELLS PRESENT    Culture   Final    Consistent with normal respiratory flora. Performed at [facility]    Report Status 01/08/2016 FINAL  Final  Pneumocystis smear by DFA     Status: None   Collection Time: 01/06/16 10:04 AM  Result Value Ref Range Status   Specimen Source-PJSRC TRACHEAL ASPIRATE  Final   Pneumocystis jiroveci Ag NEGATIVE  Final    Comment: Performed at [facility]  Culture, blood (Routine X 2) w Reflex to ID Panel     Status: None   Collection Time: 01/06/16 11:37 AM  Result Value Ref Range Status   Specimen Description BLOOD RIGHT HAND  Final   Special Requests BOTTLES DRAWN AEROBIC AND ANAEROBIC  5CC  Final   Culture   Final    NO GROWTH 5 DAYS Performed at [facility]    Report Status 01/11/2016 FINAL  Final  Culture, blood (Routine X 2) w Reflex to ID Panel     Status: None   Collection Time: 01/06/16 11:44 AM  Result Value Ref Range Status   Specimen Description BLOOD LEFT HAND  Final   Special Requests BOTTLES DRAWN AEROBIC ONLY [facility]  Final   Culture   Final    NO GROWTH 5 DAYS Performed at [facility]    Report Status 01/11/2016 FINAL   Final  Virus culture     Status: None   Collection Time: 01/11/16  5:48 PM  Result Value Ref Range Status   Viral Culture No virus isolated.  Corrected    Comment: (NOTE) Performed At: [facility] [address] [place], [place] [id number] Lindon Romp MD BH:[id number] CORRECTED ON 08/10 AT 9735: PREVIOUSLY REPORTED AS Comment    Source of  Sample EAR  Final    Comment: RIGHT  Culture, blood (routine x 2)     Status: None   Collection Time: 01/15/16  6:46 PM  Result Value Ref Range Status   Specimen Description BLOOD LEFT HAND  Final   Special Requests BOTTLES DRAWN AEROBIC AND ANAEROBIC 5CC  Final   Culture   Final    NO GROWTH 5 DAYS Performed at [facility]    Report Status 02/09/2016 FINAL  Final  Culture, blood (routine x 2)     Status: None   Collection Time: 01/15/16  6:53 PM  Result Value Ref Range Status   Specimen Description BLOOD LEFT ARM  Final   Special Requests BOTTLES DRAWN AEROBIC AND ANAEROBIC 10CC  Final   Culture   Final    NO GROWTH 5 DAYS Performed at [facility]    Report Status 01/14/2016 FINAL  Final  Culture, respiratory (NON-Expectorated)     Status: None (Preliminary result)   Collection Time: 01/19/16 11:21 AM  Result Value Ref Range Status   Specimen Description TRACHEAL ASPIRATE  Final   Special Requests NONE  Final   Gram Stain   Final    ABUNDANT WBC PRESENT,BOTH PMN AND MONONUCLEAR MODERATE SQUAMOUS EPITHELIAL CELLS PRESENT MODERATE GRAM NEGATIVE RODS MODERATE GRAM POSITIVE COCCI IN PAIRS IN CLUSTERS RARE YEAST RARE GRAM POSITIVE RODS    Culture   Final    MODERATE GRAM NEGATIVE RODS IDENTIFICATION AND SUSCEPTIBILITIES TO FOLLOW Performed at [facility]    Report Status PENDING  Incomplete  C difficile quick scan w PCR reflex     Status: None   Collection Time: 01/19/16  8:50 PM  Result Value Ref Range Status   C Diff antigen NEGATIVE NEGATIVE Final   C Diff toxin NEGATIVE NEGATIVE Final   C  Diff interpretation No C. difficile detected.  Final  Culture, blood (routine x 2)     Status: None (Preliminary result)   Collection Time: 01/15/2016 10:00 AM  Result Value Ref Range Status   Specimen Description BLOOD RIGHT ANTECUBITAL  Final   Special Requests IN PEDIATRIC BOTTLE 2CC  Final   Culture  Setup Time   Final    GRAM POSITIVE COCCI IN CLUSTERS AEROBIC BOTTLE ONLY Organism ID to follow CRITICAL RESULT CALLED TO, READ BACK BY AND VERIFIED WITHAlona Bene PHARMD [id number] 01/22/16 A BROWNING    Culture GRAM POSITIVE COCCI  Final   Report Status PENDING  Incomplete  Blood Culture ID Panel (Reflexed)     Status: Abnormal   Collection Time: 01/11/2016 10:00 AM  Result Value Ref Range Status   Enterococcus species NOT DETECTED NOT DETECTED Final   Vancomycin resistance NOT DETECTED NOT DETECTED Final   Listeria monocytogenes NOT DETECTED NOT DETECTED Final   Staphylococcus species DETECTED (A) NOT DETECTED Final    Comment: CRITICAL RESULT CALLED TO, READ BACK BY AND VERIFIED WITH: V BRYK PHARMD 0654 01/22/16 A BROWNING    Staphylococcus aureus DETECTED (A) NOT DETECTED Final    Comment: CRITICAL RESULT CALLED TO, READ BACK BY AND VERIFIED WITH: V BRYK PHARMD 0654 01/22/16 A BROWNING    Methicillin resistance DETECTED (A) NOT DETECTED Final    Comment: CRITICAL RESULT CALLED TO, READ BACK BY AND VERIFIED WITH: V BRYK PHARMD 0654 01/22/16 A BROWNING    Streptococcus species NOT DETECTED NOT DETECTED Final   Streptococcus agalactiae NOT DETECTED NOT DETECTED Final   Streptococcus pneumoniae NOT DETECTED NOT DETECTED Final  Streptococcus pyogenes NOT DETECTED NOT DETECTED Final   Acinetobacter baumannii NOT DETECTED NOT DETECTED Final   Enterobacteriaceae species NOT DETECTED NOT DETECTED Final   Enterobacter cloacae complex NOT DETECTED NOT DETECTED Final   Escherichia coli NOT DETECTED NOT DETECTED Final   Klebsiella oxytoca NOT DETECTED NOT DETECTED Final   Klebsiella pneumoniae NOT  DETECTED NOT DETECTED Final   Proteus species NOT DETECTED NOT DETECTED Final   Serratia marcescens NOT DETECTED NOT DETECTED Final   Carbapenem resistance NOT DETECTED NOT DETECTED Final   Haemophilus influenzae NOT DETECTED NOT DETECTED Final   Neisseria meningitidis NOT DETECTED NOT DETECTED Final   Pseudomonas aeruginosa NOT DETECTED NOT DETECTED Final   Candida albicans NOT DETECTED NOT DETECTED Final   Candida glabrata NOT DETECTED NOT DETECTED Final   Candida krusei NOT DETECTED NOT DETECTED Final   Candida parapsilosis NOT DETECTED NOT DETECTED Final   Candida tropicalis NOT DETECTED NOT DETECTED Final  Culture, blood (routine x 2)     Status: None (Preliminary result)   Collection Time: 01/16/2016 10:10 AM  Result Value Ref Range Status   Specimen Description BLOOD RIGHT ANTECUBITAL  Final   Special Requests IN PEDIATRIC BOTTLE 1CC  Final   Culture NO GROWTH < 12 HOURS  Final   Report Status PENDING  Incomplete  Stat Gram stain     Status: None   Collection Time: 02/05/2016  1:11 PM  Result Value Ref Range Status   Specimen Description BRONCHIAL ALVEOLAR LAVAGE  Final   Special Requests NONE  Final   Gram Stain   Final    ABUNDANT WBC PRESENT, PREDOMINANTLY PMN ABUNDANT GRAM POSITIVE COCCI IN CLUSTERS IN CHAINS FEW GRAM NEGATIVE RODS    Report Status 02/07/2016 FINAL  Final  Urine culture     Status: None   Collection Time: 02/01/2016  2:34 PM  Result Value Ref Range Status   Specimen Description URINE, CATHETERIZED  Final   Special Requests NONE  Final   Culture NO GROWTH  Final   Report Status 01/22/2016 FINAL  Final  Culture, blood (routine x 2)     Status: None (Preliminary result)   Collection Time: 01/14/2016  2:45 PM  Result Value Ref Range Status   Specimen Description BLOOD RIGHT HAND  Final   Special Requests IN PEDIATRIC BOTTLE 1CC  Final   Culture  Setup Time   Final    GRAM POSITIVE COCCI IN CLUSTERS AEROBIC BOTTLE ONLY CRITICAL VALUE NOTED.  VALUE IS  CONSISTENT WITH PREVIOUSLY REPORTED AND CALLED VALUE.    Culture GRAM POSITIVE COCCI  Final   Report Status PENDING  Incomplete  Culture, blood (routine x 2)     Status: None (Preliminary result)   Collection Time: 01/31/2016  2:52 PM  Result Value Ref Range Status   Specimen Description BLOOD RIGHT ANTECUBITAL  Final   Special Requests IN PEDIATRIC BOTTLE 2CC  Final   Culture  Setup Time   Final    GRAM POSITIVE COCCI IN CLUSTERS AEROBIC BOTTLE ONLY CRITICAL RESULT CALLED TO, READ BACK BY AND VERIFIED WITH: C BALL,PHARMD AT 1001 01/22/16 BY L BENFIELD    Culture GRAM POSITIVE COCCI  Final   Report Status PENDING  Incomplete     Impression/Recommendation  Active Problems:   Respiratory failure ([place])   Pressure ulcer   ALPHONSUS DOYEL is a 75 y.o. male with  MRSA bacteremia  And persistently positive blood cultures, VAP and septic shock, also with abdominal pain  #1 Septic  shock:   Likely due to MRSA +/- potential copathogens in lungs or ? If he has also an intra-abdominal process given his abdominal pain (would be unusual though to have both  With his platelets now going down I am going to change him to [facility] to cover MRSA + GNR (but not pseudomonas) along with flagyl to cover anerobes  I will DC zyvox and carbapenem  Ideally he should have his central line removed along with his arterial ilne (at least they are newer ones placed day of + blood cultures, undoubtedly there seeded but at least they are newer than the ones he had prior to crashing)   #2      [place] [facility] Staphylococcus aureus bacteremia   Staphylococcus aureus bacteremia (SAB) is associated with a high rate of complications and mortality.  Specific aspects of clinical management are critical to optimizing the outcome of patients with SAB.  Therefore, the [facility] Antimicrobial Management Team [facility]) has initiated an intervention aimed at improving the management of SAB at [facility].  To do so, Infectious Diseases physicians are providing an evidence-based consult for the management of all patients with SAB.     Yes No Comments  Perform follow-up blood cultures (even if the patient is afebrile) to ensure clearance of bacteremia _0  _1  I have repeated blood cultures today and I will order repeat blood cultures once week and give him a "central line holiday if he can survive to have this done   Remove vascular catheter and obtain follow-up blood cultures after the removal of the catheter _2  _3  SEE ABOVE DISCUSSION in ideal world he needs a "line holiday"  Perform echocardiography to evaluate for endocarditis (transthoracic ECHO is 40-50% sensitive, TEE is > 90% sensitive) _4  _5  Please keep in mind, that neither test can definitively EXCLUDE endocarditis, and that should clinical suspicion remain high for endocarditis the patient should then still be treated with an "endocarditis" duration of therapy = 6 weeks  TTE ordered  Consult electrophysiologist to evaluate implanted cardiac device (pacemaker, ICD) _6  _7    Ensure source control _8  _9  Have all abscesses been drained effectively? Have deep seeded infections (septic joints or osteomyelitis) had appropriate surgical debridement?  Investigate for "metastatic" sites of infection _10  _11  Does the patient have ANY symptom or physical exam finding that would suggest a deeper infection (back or neck pain that may be suggestive of vertebral osteomyelitis or epidural abscess, muscle pain that could be a symptom of pyomyositis)?  Keep in mind that for deep seeded infections MRI imaging with contrast is preferred rather than other often insensitive tests such as plain x-rays, especially early in a patient's presentation. Hard to do a this point  Change antibiotic therapy to teflaro _12  _13  Beta-lactam antibiotics are preferred for MSSA due to higher cure rates.   If on Vancomycin, goal trough should be 15 - 20 mcg/mL  Estimated  duration of IV antibiotic therapy:    6 weeks _14  _15  Consult case management for probably prolonged outpatient IV antibiotic therapy       01/22/2016, 1:16 PM   Thank you so much for this interesting consult  [facility] [place] [phone] (pager) [phone] (office) 01/22/2016, 1:16 PM  [place] 01/22/2016, 1:16 PM

## 2016-01-22 NOTE — Progress Notes (Signed)
Initial Nutrition Assessment  DOCUMENTATION CODES:  Severe malnutrition in context of acute illness/injury   Pt meets criteria for SEVERE MALNUTRITION in the context of Acute Illness as evidenced by an estimated intake that has met < or equal to 50% of needs for > or equal to 5 days and mod fat/muscle wasting, mod-severe fluid accumulation. .  INTERVENTION:  If/when medically able, recommend prompt TF via NGT/OGT with Nepro Carb steady at goal rate of 35 ml/h (840 ml per day) and Prostat 60 ml BID to provide 1912 kcals, 128 gm protein, 611 ml free water daily.  Per chart review, pt has had severe constipation last admission, he has not had a BM since arrival. He may need agressive bowel regimen  NUTRITION DIAGNOSIS:  Inadequate oral intake related to inability to eat as evidenced by NPO status.  GOAL:  Patient will meet greater than or equal to 90% of their needs  MONITOR:  Diet advancement, Vent status, Labs, I & O's, TF tolerance  REASON FOR ASSESSMENT:  Ventilator    ASSESSMENT:  75 y/o male PMHx CKD III, CHF, CAD, COPD, AFIB, HLD. Pt has had multiple admission since mid July. Most recently a complicated admission w/ ARDS, shOCK,  at [facility] from 7/24-8/9. Briefly transferred to [facility], but worsened and was readmitted on 8/11 for suspected HCAP  Unclear how pt was eating at [facility], but was only there for <48 hrs.  Per chart review, pt last received TF on 8/9 @ a trickle rate. Last time he had TF at goal was 8/6. He appears to have gone >5 days with <50% of his needs.   Pt apparently had severe constipation last admission that necessitated the decreasing of his TF rate substantially. He has not had a BM since readmitted per RN. This should be closely monitored to prevent reoccurence  Patient is currently intubated on ventilator support MV:16.2 L/min Temp (24hrs), Avg:99 F (37.2 C), Min:98.2 F (36.8 C), Max:100.1 F (37.8 C)  Propofol: None  Meds: IV abx, fentanyl, multiple  pressors, sodium bicarb, MAP borderline at 64 mmhg  NFPE: Mod degree thoracic fat wasting w/ very easily palpated ribs, moderate degree temporal, deltoid muscle wasting. Moderate wasting of musculature in clavicular region. BUE mod-severe swelling.   Labs reviewed: Noted severe hyperglycemia. BUN/Creat worsening. Phos significantly elevated, k trending up    Recent Labs Lab 02/03/2016 0550 02/05/2016 1447 02/03/2016 1859 01/22/16 0201  NA 148*  149* 148* 145 146*  K 3.4*  3.5 3.8 4.5 5.1  CL 116*  115* 119* 120* 115*  CO2 25  26 24  21* 19*  BUN 37*  38* 44* 47* 54*  CREATININE 1.21  1.21 1.85* 1.86* 2.35*  CALCIUM 8.2*  8.2* 7.7* 7.2* 7.1*  MG 1.8 1.7  --  1.7  PHOS 2.1* 4.0  --  7.2*  GLUCOSE 259*  258* 284* 320* 413*   Diet Order:  Diet NPO time specified  Skin: MSAD to groin/buttocks, Abrasion to arm, Unstageable wound to sacrum, DTI sacrum  Last BM:  8/9  Height:  Ht Readings from Last 1 Encounters:  01/05/16 5' 8"  (1.727 m)   Weight:  Wt Readings from Last 1 Encounters:  01/22/16 150 lb 12.7 oz (68.4 kg)   Wt Readings from Last 10 Encounters:  01/22/16 150 lb 12.7 oz (68.4 kg)  01/19/16 147 lb 4.3 oz (66.8 kg)  01/03/16 152 lb 8 oz (69.2 kg)  01/01/16 152 lb 8 oz (69.2 kg)  12/22/15 161 lb 8 oz (  73.3 kg)  12/22/15 155 lb (70.3 kg)  12/16/15 155 lb 9.6 oz (70.6 kg)  12/10/15 165 lb 8 oz (75.1 kg)  12/07/15 165 lb 8 oz (75.1 kg)  11/23/15 159 lb 12.8 oz (72.5 kg)  Lowest wt from last admission was 145 lbs- dosing weight.   Ideal Body Weight:  70 kg  BMI:  Body mass index is 22.1 kg/m.  Estimated Nutritional Needs:  Kcal:  1917 kcals Protein:  112-132 g (1.7-2 g/kg bw) Fluid:  PER MD  EDUCATION NEEDS:  No education needs identified at this time  Burtis Junes RD, LDN, [place] [facility] Pager: [phone] 01/22/2016 11:35 AM

## 2016-01-22 NOTE — Progress Notes (Signed)
ANTICOAGULATION CONSULT NOTE - Follow Up Consult  Pharmacy Consult for Heparin  Indication: atrial fibrillation  Allergies  Allergen Reactions  . Ace Inhibitors Other (See Comments)    Causes high K   . Angiotensin Receptor Blockers Other (See Comments)    Causes high K   . Metoprolol Other (See Comments)    May cause elevated K level   . Penicillins Swelling and Rash    Has patient had a PCN reaction causing immediate rash, facial/tongue/throat swelling, SOB or lightheadedness with hypotension: YES Has patient had a PCN reaction causing severe rash involving mucus membranes or skin necrosis: NO Has patient had a PCN reaction that required hospitalization NO Has patient had a PCN reaction occurring within the last 10 years: NO If all of the above answers are "NO", then may proceed with Cephalosporin use. Pt has tolerated cephalosporins in the past   . Amiodarone Other (See Comments)    12/27/15: Cardiologist discontinued the amiodarone (on PTA) due to  concern for toxicity  (in hospitial with acute hypoxemic respiratory failure)   . Tetracycline Swelling and Rash    Patient Measurements: Weight: 150 lb 12.7 oz (68.4 kg) Heparin Dosing Weight: 68.4 kg  Vital Signs: Temp: 98.5 F (36.9 C) (08/12 2013) Temp Source: Oral (08/12 2013) BP: 99/58 (08/12 2100) Pulse Rate: 93 (08/12 2100)  Labs:  Recent Labs  01/20/16 0540  01/29/2016 1447 02/05/2016 1859 02/09/2016 2033 02/10/2016 2338 01/22/16 0201 01/22/16 0415 01/22/16 1010 01/22/16 2035  HGB 10.0*  < > 9.4* 9.5*  --   --  9.3*  --   --   --   HCT 32.8*  < > 31.1* 31.7*  --   --  31.5*  --   --   --   PLT 149*  < > 148* 141*  --   --  138*  --   --   --   APTT  --   --  31  --   --   --   --   --   --   --   LABPROT 14.7  --  15.7*  --   --   --   --   --   --   --   INR 1.15  --  1.24  --   --   --   --   --   --   --   HEPARINUNFRC  --   --   --   --   --  <0.10*  --   --  <0.10* 0.31  CREATININE 1.39*  < > 1.85*  1.86*  --   --  2.35*  --   --   --   TROPONINI  --   --  0.11*  --  0.08*  --   --  0.06*  --   --   < > = values in this interval not displayed.  Estimated Creatinine Clearance: 25.9 mL/min (by C-G formula based on SCr of 2.35 mg/dL).   Assessment: Patient is a 75 yo male who is presented to ICU from [facility] with suspected HCAP. He was on chronic warfarin therapy for a fib, but this has been held for several days now. His INR is 1.24 today.  8 hour HL = 0.31 on 1250 units/hr ,  Heparin level is therapeutic, though at low end of goal.   On 02/10/2016, the hgb is low stable at 9.3 g/dL and platelet count is also low stable at 138 k/uL.  It was noted by provider that he developed thrombocytopenia while on linezolid therapy, which is mild and stable. No bleeding noted.     Goal of Therapy:  Heparin level 0.3-0.7 units/ml Monitor platelets by anticoagulation protocol: Yes   Plan:  Increase heparin IV to 1300 units/hr Daily HL and CBC monitoring while on heparin  Nicole Cella, RPh Clinical Pharmacist Pager: [phone] 01/22/2016,9:38 PM

## 2016-01-23 ENCOUNTER — Inpatient Hospital Stay (HOSPITAL_COMMUNITY): Payer: Medicare Other

## 2016-01-23 DIAGNOSIS — I4581 Long QT syndrome: Secondary | ICD-10-CM

## 2016-01-23 DIAGNOSIS — J95851 Ventilator associated pneumonia: Secondary | ICD-10-CM

## 2016-01-23 DIAGNOSIS — J930 Spontaneous tension pneumothorax: Secondary | ICD-10-CM

## 2016-01-23 DIAGNOSIS — E43 Unspecified severe protein-calorie malnutrition: Secondary | ICD-10-CM

## 2016-01-23 DIAGNOSIS — R7881 Bacteremia: Secondary | ICD-10-CM

## 2016-01-23 DIAGNOSIS — R109 Unspecified abdominal pain: Secondary | ICD-10-CM

## 2016-01-23 DIAGNOSIS — N19 Unspecified kidney failure: Secondary | ICD-10-CM

## 2016-01-23 DIAGNOSIS — J939 Pneumothorax, unspecified: Secondary | ICD-10-CM

## 2016-01-23 DIAGNOSIS — A4101 Sepsis due to Methicillin susceptible Staphylococcus aureus: Secondary | ICD-10-CM

## 2016-01-23 DIAGNOSIS — B9562 Methicillin resistant Staphylococcus aureus infection as the cause of diseases classified elsewhere: Secondary | ICD-10-CM

## 2016-01-23 DIAGNOSIS — Z1624 Resistance to multiple antibiotics: Secondary | ICD-10-CM

## 2016-01-23 DIAGNOSIS — J9601 Acute respiratory failure with hypoxia: Secondary | ICD-10-CM

## 2016-01-23 DIAGNOSIS — A498 Other bacterial infections of unspecified site: Secondary | ICD-10-CM

## 2016-01-23 LAB — CBC
HEMATOCRIT: 23.5 % — AB (ref 39.0–52.0)
HEMATOCRIT: 24 % — AB (ref 39.0–52.0)
HEMOGLOBIN: 7.7 g/dL — AB (ref 13.0–17.0)
Hemoglobin: 7.2 g/dL — ABNORMAL LOW (ref 13.0–17.0)
MCH: 28.9 pg (ref 26.0–34.0)
MCH: 29.6 pg (ref 26.0–34.0)
MCHC: 30.6 g/dL (ref 30.0–36.0)
MCHC: 32.1 g/dL (ref 30.0–36.0)
MCV: 94.4 fL (ref 78.0–100.0)
MCV: 92.3 fL (ref 78.0–100.0)
Platelets: 78 10*3/uL — ABNORMAL LOW (ref 150–400)
Platelets: 94 10*3/uL — ABNORMAL LOW (ref 150–400)
RBC: 2.49 MIL/uL — AB (ref 4.22–5.81)
RBC: 2.6 MIL/uL — AB (ref 4.22–5.81)
RDW: 19.6 % — ABNORMAL HIGH (ref 11.5–15.5)
RDW: 19.6 % — ABNORMAL HIGH (ref 11.5–15.5)
WBC: 11.9 10*3/uL — AB (ref 4.0–10.5)
WBC: 14.4 10*3/uL — ABNORMAL HIGH (ref 4.0–10.5)

## 2016-01-23 LAB — BLOOD GAS, ARTERIAL
ACID-BASE DEFICIT: 13.5 mmol/L — AB (ref 0.0–2.0)
ACID-BASE DEFICIT: 3.4 mmol/L — AB (ref 0.0–2.0)
ACID-BASE DEFICIT: 0.8 mmol/L (ref 0.0–2.0)
Bicarbonate: 13.1 mEq/L — ABNORMAL LOW (ref 20.0–24.0)
Bicarbonate: 22.1 mEq/L (ref 20.0–24.0)
Bicarbonate: 24.3 mEq/L — ABNORMAL HIGH (ref 20.0–24.0)
DRAWN BY: 365271
DRAWN BY: 129711
Drawn by: 27407
FIO2: 0.6
FIO2: 70
FIO2: 50
LHR: 28 {breaths}/min
LHR: 28 {breaths}/min
MECHVT: 500 mL
MECHVT: 500 mL
O2 SAT: 97.6 %
O2 SAT: 92.5 %
O2 Saturation: 84.8 %
PATIENT TEMPERATURE: 98.6
PATIENT TEMPERATURE: 98.6
PCO2 ART: 36.1 mmHg (ref 35.0–45.0)
PEEP/CPAP: 8 cmH2O
PEEP/CPAP: 8 cmH2O
PEEP: 8 cmH2O
PO2 ART: 63.9 mmHg — AB (ref 80.0–100.0)
Patient temperature: 98.6
RATE: 28 resp/min
TCO2: 14.2 mmol/L (ref 0–100)
TCO2: 23.5 mmol/L (ref 0–100)
TCO2: 25.7 mmol/L (ref 0–100)
VT: 500 mL
pCO2 arterial: 46.9 mmHg — ABNORMAL HIGH (ref 35.0–45.0)
pCO2 arterial: 46.9 mmHg — ABNORMAL HIGH (ref 35.0–45.0)
pH, Arterial: 7.186 — CL (ref 7.350–7.450)
pH, Arterial: 7.295 — ABNORMAL LOW (ref 7.350–7.450)
pH, Arterial: 7.335 — ABNORMAL LOW (ref 7.350–7.450)
pO2, Arterial: 120 mmHg — ABNORMAL HIGH (ref 80.0–100.0)
pO2, Arterial: 72.5 mmHg — ABNORMAL LOW (ref 80.0–100.0)

## 2016-01-23 LAB — BASIC METABOLIC PANEL
ANION GAP: 17 — AB (ref 5–15)
ANION GAP: 8 (ref 5–15)
BUN: 64 mg/dL — AB (ref 6–20)
BUN: 64 mg/dL — ABNORMAL HIGH (ref 6–20)
CHLORIDE: 112 mmol/L — AB (ref 101–111)
CHLORIDE: 113 mmol/L — AB (ref 101–111)
CO2: 18 mmol/L — AB (ref 22–32)
CO2: 26 mmol/L (ref 22–32)
CREATININE: 2.99 mg/dL — AB (ref 0.61–1.24)
Calcium: 6.1 mg/dL — CL (ref 8.9–10.3)
Calcium: 6.1 mg/dL — CL (ref 8.9–10.3)
Creatinine, Ser: 2.92 mg/dL — ABNORMAL HIGH (ref 0.61–1.24)
GFR calc Af Amer: 22 mL/min — ABNORMAL LOW (ref >60)
GFR calc Af Amer: 23 mL/min — ABNORMAL LOW (ref >60)
GFR calc non Af Amer: 19 mL/min — ABNORMAL LOW (ref >60)
GFR, EST NON AFRICAN AMERICAN: 19 mL/min — AB (ref >60)
GLUCOSE: 340 mg/dL — AB (ref 65–99)
GLUCOSE: 94 mg/dL (ref 65–99)
POTASSIUM: 4.3 mmol/L (ref 3.5–5.1)
POTASSIUM: 3.9 mmol/L (ref 3.5–5.1)
Sodium: 147 mmol/L — ABNORMAL HIGH (ref 135–145)
Sodium: 147 mmol/L — ABNORMAL HIGH (ref 135–145)

## 2016-01-23 LAB — MAGNESIUM
Magnesium: 1.5 mg/dL — ABNORMAL LOW (ref 1.7–2.4)
Magnesium: 1.6 mg/dL — ABNORMAL LOW (ref 1.7–2.4)
Magnesium: 1.5 mg/dL — ABNORMAL LOW (ref 1.7–2.4)

## 2016-01-23 LAB — HEPARIN LEVEL (UNFRACTIONATED): Heparin Unfractionated: 0.18 IU/mL — ABNORMAL LOW (ref 0.30–0.70)

## 2016-01-23 LAB — CBC WITH DIFFERENTIAL/PLATELET
BASOS PCT: 0 %
Basophils Absolute: 0 10*3/uL (ref 0.0–0.1)
EOS ABS: 0 10*3/uL (ref 0.0–0.7)
EOS PCT: 0 %
HEMATOCRIT: 27.2 % — AB (ref 39.0–52.0)
Hemoglobin: 8.2 g/dL — ABNORMAL LOW (ref 13.0–17.0)
LYMPHS PCT: 4 %
Lymphs Abs: 0.5 10*3/uL — ABNORMAL LOW (ref 0.7–4.0)
MCH: 29.6 pg (ref 26.0–34.0)
MCHC: 30.1 g/dL (ref 30.0–36.0)
MCV: 98.2 fL (ref 78.0–100.0)
Monocytes Absolute: 0 10*3/uL — ABNORMAL LOW (ref 0.1–1.0)
Monocytes Relative: 0 %
NEUTROS ABS: 12.9 10*3/uL — AB (ref 1.7–7.7)
Neutrophils Relative %: 96 %
PLATELETS: 99 10*3/uL — AB (ref 150–400)
RBC: 2.77 MIL/uL — ABNORMAL LOW (ref 4.22–5.81)
RDW: 19.9 % — ABNORMAL HIGH (ref 11.5–15.5)
WBC MORPHOLOGY: INCREASED
WBC: 13.4 10*3/uL — ABNORMAL HIGH (ref 4.0–10.5)

## 2016-01-23 LAB — CULTURE, BAL-QUANTITATIVE W GRAM STAIN

## 2016-01-23 LAB — PHOSPHORUS
Phosphorus: 6.2 mg/dL — ABNORMAL HIGH (ref 2.5–4.6)
Phosphorus: 5.4 mg/dL — ABNORMAL HIGH (ref 2.5–4.6)
Phosphorus: 5.2 mg/dL — ABNORMAL HIGH (ref 2.5–4.6)

## 2016-01-23 LAB — LACTIC ACID, PLASMA
LACTIC ACID, VENOUS: 8.7 mmol/L — AB (ref 0.5–1.9)
Lactic Acid, Venous: 7 mmol/L (ref 0.5–1.9)
Lactic Acid, Venous: 3 mmol/L (ref 0.5–1.9)

## 2016-01-23 LAB — PROCALCITONIN: Procalcitonin: 79.69 ng/mL

## 2016-01-23 MED ORDER — PRO-STAT SUGAR FREE PO LIQD: 30.0000 mL | Freq: Two times a day (BID) | ORAL | Status: DC

## 2016-01-23 MED ORDER — NEPRO/CARBSTEADY PO LIQD: 1000.0000 mL | ORAL | Status: DC

## 2016-01-23 MED ORDER — ALBUMIN HUMAN 25 % IV SOLN
25.0000 g | Freq: Once | INTRAVENOUS | Status: AC
  Administered 2016-01-23: 25 g via INTRAVENOUS
  Filled 2016-01-23: qty 50

## 2016-01-23 MED ORDER — DILTIAZEM HCL 100 MG IV SOLR
5.0000 mg/h | INTRAVENOUS | Status: DC
  Administered 2016-01-23: 5 mg/h via INTRAVENOUS
  Administered 2016-01-24 (×2): 10 mg/h via INTRAVENOUS
  Administered 2016-01-25: 5 mg/h via INTRAVENOUS
  Filled 2016-01-23 (×4): qty 100

## 2016-01-23 MED ORDER — PRO-STAT SUGAR FREE PO LIQD
60.0000 mL | Freq: Two times a day (BID) | ORAL | Status: DC
  Administered 2016-01-23 – 2016-01-25 (×5): 60 mL
  Filled 2016-01-23 (×5): qty 60

## 2016-01-23 MED ORDER — DILTIAZEM LOAD VIA INFUSION
10.0000 mg | Freq: Once | INTRAVENOUS | Status: AC
  Administered 2016-01-23: 10 mg via INTRAVENOUS
  Filled 2016-01-23: qty 10

## 2016-01-23 MED ORDER — SODIUM CHLORIDE 0.9 % IV SOLN
INTRAVENOUS | Status: AC
  Administered 2016-01-23: 02:00:00 via INTRAVENOUS

## 2016-01-23 MED ORDER — NEPRO/CARBSTEADY PO LIQD
1000.0000 mL | ORAL | Status: DC
  Administered 2016-01-23: 1000 mL via ORAL
  Filled 2016-01-23 (×4): qty 1000

## 2016-01-23 MED ORDER — VITAL HIGH PROTEIN PO LIQD: 1000.0000 mL | ORAL | Status: DC

## 2016-01-23 MED ORDER — FREE WATER
200.0000 mL | Freq: Three times a day (TID) | Status: DC
  Administered 2016-01-23 – 2016-01-24 (×3): 200 mL

## 2016-01-23 MED ORDER — SODIUM BICARBONATE 8.4 % IV SOLN
50.0000 meq | Freq: Once | INTRAVENOUS | Status: AC
  Administered 2016-01-23: 50 meq via INTRAVENOUS
  Filled 2016-01-23: qty 50

## 2016-01-23 MED ORDER — SODIUM CHLORIDE 0.9 % IV SOLN
1.0000 mg/h | INTRAVENOUS | Status: DC
  Administered 2016-01-27: 1 mg/h via INTRAVENOUS
  Filled 2016-01-23: qty 10

## 2016-01-23 MED ORDER — PHENYLEPHRINE HCL 10 MG/ML IJ SOLN
30.0000 ug/min | INTRAVENOUS | Status: DC
  Filled 2016-01-23: qty 1

## 2016-01-23 MED ORDER — SODIUM CHLORIDE 0.9 % IV SOLN
INTRAVENOUS | Status: AC
  Administered 2016-01-23: 1000 mL/h via INTRAVENOUS

## 2016-01-23 MED ORDER — SODIUM BICARBONATE 8.4 % IV SOLN
INTRAVENOUS | Status: AC
  Administered 2016-01-23: 50 meq via INTRAVENOUS
  Filled 2016-01-23: qty 50

## 2016-01-23 MED ORDER — MAGNESIUM SULFATE IN D5W 1-5 GM/100ML-% IV SOLN
1.0000 g | Freq: Once | INTRAVENOUS | Status: AC
  Administered 2016-01-23: 1 g via INTRAVENOUS
  Filled 2016-01-23: qty 100

## 2016-01-23 NOTE — Progress Notes (Signed)
[facility] Progress Note Patient Name: Curtis Frazier DOB: [birth year] MRN: [id number]   Date of Service  01/23/2016  HPI/Events of Note  Nursing concerned about cardizem in pt on levophed  eICU Interventions  Try wean levophed and replace with neo and continue vasopressin at .03      Intervention Category Major Interventions: Shock - evaluation and management  Christinia Gully 01/23/2016, 9:05 PM

## 2016-01-23 NOTE — Progress Notes (Addendum)
PULMONARY / CRITICAL CARE MEDICINE   Name: Curtis Frazier MRN: [id number] DOB: 07-May-1941    ADMISSION DATE:  01/13/2016   REFERRING MD: [facility]  CHIEF COMPLAINT:  Acute on chronic resp failure  BRIEF 75 yo male with recent admit and dc from [facility] with baseline RA , steroid dependent, developed ARDS and required tracheostomy. Renal function was stage 3 but he did come off CVVH. Transferred to [facility] 8/9 and PCCM consulted on same day. He spent 12 hours on trach collar 8/10 but on 8/11 he was back on full vent support with rr 37 , severe air hunger and anxiety. FOB per Dr. Rosina Lowenstein with rt lower lobe with copious thick white secretions. Due ti his decline aand family wishes he will be transferred to [facility] and to ICU and treated for suspected HCAP.      CULTURES: 8/9 sptum GNR>> 8/11 bal>> 8/11 bc x 2>> GPC 8/11 uc>> 8/12 procal>>90    ANTIBIOTICS: 8/11 zyvox>> 8/11 meropenem >>  EVENTS: Trach 8/6>> 8/9./17 - dc to [facility] from [facility] 8/11 transferred from [facility] to [facility], aline placed 8/11 2 d>> 8/11 Abd US>> 01/31/2016 - femoral line and aline  01/22/16 - on 11mg levophed. On vasopression. ID says he is bacteermie with possible MRSA. Ltd code. On IV heparin for A Fib. Severe metab acidosis despite bic gtt. Creat getting worse. shiock liver some btter. Daughter at bedside    SUBJECTIVE/OVERNIGHT/INTERVAL HX 01/23/16: blood cultures are now + for MRSA and Coag negative staph by biofire . Tension L pneumothorax with acidosis, shock - /sp Wayne chest tube by fellow. worsenign creat/anuria. Acidotic. On fent gtt + levophed + vaso + bic gtt. Oon70% fip2/peep 8. Off linezolid since yesterday  VITAL SIGNS: BP (!) 137/58   Pulse 82   Temp 98.3 F (36.8 C) (Oral)   Resp (!) 28   Wt 68.4 kg (150 lb 12.7 oz)   SpO2 100%   BMI 22.93 kg/m   HEMODYNAMICS:    VENTILATOR SETTINGS: Vent Mode: PRVC FiO2 (%):  [40 %-100 %] 100 % Set Rate:  [28 bmp] 28 bmp Vt Set:  [500 mL] 500 mL PEEP:  [8  cmH20] 8 cmH20 Plateau Pressure:  [13 cmH20-23 cmH20] 23 cmH20  INTAKE / OUTPUT: I/O last 3 completed shifts: In: 11988.9 [I.V.:7838.9; Other:3000; IV Piggyback:1150] Out: 380 [Urine:180; Chest Tube:200]  PHYSICAL EXAMINATION:  General:   full vent support Neuro: sedated , RASS -3 HEENT:  Cook/AT, #6 cuffed trach in place Cardiovascular:  RRR, no MRG Lungs:  Coarse bases, sync wuith vent, 40% fio2 Abdomen:  Soft, slight tenderness, non-distended Musculoskeletal:  No acute deformity  Skin:  Grossly intact  LABS: PULMONARY  Recent Labs Lab 01/22/16 0155 01/22/16 0412 01/22/16 1508 01/22/16 2035 01/23/16 0211  PHART 7.191* 7.196* 7.208* 7.258* 7.186*  PCO2ART 47.0* 46.9* 36.2 34.2* 36.1  PO2ART 111* 125* 72.7* 65.9* 63.9*  HCO3 17.1* 17.5* 13.9* 14.8* 13.1*  TCO2 18.5 18.9 15.0 15.9 14.2  O2SAT 96.5 97.5 89.9 89.4 84.8    CBC  Recent Labs Lab 02/08/2016 1859 01/22/16 0201 01/23/16 0458  HGB 9.5* 9.3* 8.2*  HCT 31.7* 31.5* 27.2*  WBC 17.5* 18.3* 13.4*  PLT 141* 138* 99*    COAGULATION  Recent Labs Lab 01/17/16 0600 01/20/16 0540 01/23/2016 1447  INR 1.29 1.15 1.24    CARDIAC    Recent Labs Lab 01/23/2016 1447 02/03/2016 2033 01/22/16 0415  TROPONINI 0.11* 0.08* 0.06*   No results for input(s): PROBNP in the last 168  hours.   CHEMISTRY  Recent Labs Lab 01/20/16 0540 01/11/2016 0550 02/01/2016 1447 01/24/2016 1859 01/22/16 0201 01/23/16 0458  NA 152* 148*  149* 148* 145 146* 147*  K 3.9 3.4*  3.5 3.8 4.5 5.1 4.3  CL 117* 116*  115* 119* 120* 115* 112*  CO2 _0 21* 19* 18*  GLUCOSE 148* 259*  258* 284* 320* 413* 340*  BUN 39* 37*  38* 44* 47* 54* 64*  CREATININE 1.39* 1.21  1.21 1.85* 1.86* 2.35* 2.99*  CALCIUM 8.8* 8.2*  8.2* 7.7* 7.2* 7.1* 6.1*  MG 1.5* 1.8 1.7  --  1.7 1.5*  PHOS 2.7 2.1* 4.0  --  7.2* 6.2*   Estimated Creatinine Clearance: 20.3 mL/min (by C-G formula based on SCr of 2.99 mg/dL).   LIVER  Recent Labs Lab  01/17/16 0600  01/19/16 0345 01/20/16 0540 02/01/2016 0550 02/03/2016 1447 02/08/2016 1859  AST 133*  --   --  256* 215* 156* 148*  ALT 190*  --   --  229* 255* 230* 216*  ALKPHOS 293*  --   --  685* 745* 688* 661*  BILITOT 1.4*  --   --  1.5* 0.9 1.1 1.1  PROT 5.1*  --   --  5.2* 4.6* 4.4* 4.2*  ALBUMIN 2.0*  1.9*  < > 1.8* 1.9* 1.5*  1.5* 1.4* 1.3*  INR 1.29  --   --  1.15  --  1.24  --   < > = values in this interval not displayed.   INFECTIOUS  Recent Labs Lab 02/05/2016 1859 01/17/2016 1900 01/22/16 0201 01/23/16 0000 01/23/16 0458  LATICACIDVEN  --  1.8  --  8.7* 7.0*  PROCALCITON 28.30  --  90.54  --  79.69     ENDOCRINE CBG (last 3)  No results for input(s): GLUCAP in the last 72 hours.       IMAGING x48h  - image(s) personally visualized  -   highlighted in bold US Abdomen Complete  Result Date: 01/18/2016 CLINICAL DATA:  Inpatient. Two days of abdominal pain. Elevated liver function tests. Patient is on a ventilator. EXAM: ABDOMEN ULTRASOUND COMPLETE COMPARISON:  01/16/2016 CT abdomen/pelvis. FINDINGS: Gallbladder: Mildly distended gallbladder contains layering calcified shadowing gallstones and a small amount of sludge with gallstones measuring up to 9 mm in size. No gallbladder wall thickening or pericholecystic fluid. Sonographic Murphy sign cannot be assessed on this ventilated patient. Common bile duct: Diameter: 5 mm Liver: No focal lesion identified. Within normal limits in parenchymal echogenicity. IVC: No abnormality visualized. Pancreas: Visualized portion unremarkable. Spleen: Size and appearance within normal limits. Right Kidney: Length: 11.4 cm. Mildly echogenic right kidney. No mass or hydronephrosis visualized. Left Kidney: Length: 12.1 cm. Mildly echogenic left kidney. No hydronephrosis. Nonobstructing 4 mm stone in the mid to lower left kidney. Limited visualization of a 1.2 cm renal cyst in the upper left kidney. Abdominal aorta: No aneurysm visualized.  Other findings: Small left pleural effusion. No upper abdominal ascites. IMPRESSION: 1. Cholelithiasis. No gallbladder wall thickening. No pericholecystic fluid. Cannot assess sonographic Murphy's sign on this ventilated patient. No specific findings of acute cholecystitis. 2. No biliary ductal dilatation. 3. Nonobstructing left renal stone.  No hydronephrosis. 4. Mildly echogenic kidneys, indicating nonspecific renal parenchymal disease of uncertain chronicity. 5. Small left pleural effusion. Electronically Signed   By: Ilona Sorrel M.D.   On: 02/09/2016 20:09   Dg Chest Port 1 View  Result Date: 01/23/2016 CLINICAL DATA:  Chest tube  EXAM: PORTABLE CHEST 1 VIEW COMPARISON:  01/23/2016 FINDINGS: Tracheostomy remains in good position. NG tube enters the stomach with the tip not visualized. Large left pneumothorax unchanged from earlier today. Interval placement of small bore chest tube catheter anteriorly on the left without significant change in pneumothorax. Mild shift of the right suggesting tension. Left lower lobe collapse unchanged. Right lower lobe atelectasis unchanged. IMPRESSION: Small bore chest tube on the left without significant change in large left tension pneumothorax. Electronically Signed   By: Franchot Gallo M.D.   On: 01/23/2016 07:53   Dg Chest Port 1 View  Result Date: 01/23/2016 CLINICAL DATA:  Left chest tube EXAM: PORTABLE CHEST 1 VIEW COMPARISON:  Chest radiograph from earlier today. FINDINGS: Left apical chest tube is in place. Tracheostomy tube tip overlies the tracheal air column at the thoracic inlet. Enteric tube enters stomach with the tip not seen on this image. Stable cardiomediastinal silhouette with top-normal heart size. No appreciable residual pneumothorax. No residual mediastinal shift. No pleural effusion. Patchy opacities throughout both lungs, most prominent at the lung bases, not definitely changed. Extensive subcutaneous emphysema in the left chest wall appears  increased. Partially visualized bilateral spinal fusion hardware overlies the lower thoracic spine. IMPRESSION: 1. No residual left pneumothorax status post left chest tube placement. No residual mediastinal shift. Prominent subcutaneous emphysema in the left chest wall. 2. Patchy opacities throughout both lungs, not appreciably changed, most prominent at the lung bases, suspicious for multifocal pneumonia. Electronically Signed   By: Ilona Sorrel M.D.   On: 01/23/2016 07:32   Dg Chest Port 1 View  Result Date: 01/23/2016 CLINICAL DATA:  Acute onset of shortness of breath. Initial encounter. EXAM: PORTABLE CHEST 1 VIEW COMPARISON:  Chest radiograph performed 02/02/2016 FINDINGS: The patient's tracheostomy tube is seen ending 7 cm above the carina. There is a very large left-sided pneumothorax, with mild rightward mediastinal shift, concerning for some degree of tension. Right-sided atelectasis and vascular congestion are seen. Mild underlying airspace opacity may reflect interstitial edema or pneumonia. No pleural effusion is identified. The cardiomediastinal silhouette remains normal in size. Thoracolumbar spinal fusion hardware is partially imaged. No acute osseous abnormalities are seen. An enteric tube is noted extending below the diaphragm. IMPRESSION: 1. Tracheostomy tube seen ending 7 cm above the carina. 2. New very large left-sided pneumothorax, with mild rightward mediastinal shift, concerning for some degree of tension. 3. Right-sided atelectasis and vascular congestion seen. Mild underlying airspace opacity may reflect interstitial edema or pneumonia. Critical Value/emergent results were called by telephone at the time of interpretation on 01/23/2016 at 5:26 am to Dr. Tamala Julian, who verbally acknowledged these results. Electronically Signed   By: Garald Balding M.D.   On: 01/23/2016 05:41   Dg Chest Port 1 View  Result Date: 01/19/2016 CLINICAL DATA:  RIGHT femoral line placement EXAM: PORTABLE CHEST  1 VIEW COMPARISON:  Portable exam 1611 hours compared to 1228 hours FINDINGS: Tracheostomy tube and nasogastric tube unchanged. No femoral line identified. Prior thoracolumbar fusion. Stable heart size, mediastinal contours and pulmonary vascularity. Persistent pulmonary infiltrates bilaterally, basilar predominance, question edema though infection not excluded. No pleural effusion or pneumothorax. Atherosclerotic calcification aorta. IMPRESSION: Persistent pulmonary infiltrates. RIGHT femoral line is not visualized. Electronically Signed   By: Lavonia Dana M.D.   On: 02/03/2016 16:33   Dg Chest Port 1 View  Result Date: 01/11/2016 CLINICAL DATA:  Status post bronchoscopy. EXAM: PORTABLE CHEST 1 VIEW COMPARISON:  Same day. FINDINGS: Stable cardiomediastinal silhouette. Tracheostomy and nasogastric tubes  are unchanged in position. Status post surgical posterior fusion of lower thoracic and upper lumbar spine. No pneumothorax or significant pleural effusion is noted. Stable diffuse interstitial densities are noted throughout both lungs concerning for pulmonary edema. Moderate dextroscoliosis of lower thoracic and upper lumbar spine is noted. IMPRESSION: Stable support apparatus. Stable diffuse interstitial densities are noted in both lungs consistent with pulmonary edema. Electronically Signed   By: Marijo Conception, M.D.   On: 01/25/2016 12:50      DISCUSSION: 75 yo male with recent admit and dc from [facility] with baseline RA , steroid dependent, developed ARDS and required tracheostomy. Renal function was stage 3 but he did come off CVVH. Transferred to [facility] 8/9 and PCCM consulted on same day. He spent 12 hours on trach collar 8/10 but on 8/11 he was back on full vent support with rr 37 , severe air hunger and anxiety. FOB per Dr. Rosina Lowenstein with rt lower lobe with copious thick white secretions. Due ti his decline aand family wishes he will be transferred to [facility] and to ICU and treated for suspected HCAP.    ASSESSMENT / PLAN:  PULMONARY A: Severe bullous emphysema Trached 8/3 COPD PNA  - now acute on chronic resp failure due to HCAP/MRSA./ARDS since 01/22/2016  - 01/23/16: worse and course complicated by l ptx  P:   Full vent protocl Deep sedation Left chest tube Check abg and if acidosis ok; consider 48h nimbex  CARDIOVASCULAR A:  CAD, Mod AS  =- septic shock since 02/10/2016; on pressors  Unchanged higher dose P:  manage with femroal cvfl Map goal > 85   RENAL Lab Results  Component Value Date   CREATININE 2.99 (H) 01/23/2016   CREATININE 2.35 (H) 01/22/2016   CREATININE 1.86 (H) 02/01/2016   CREATININE 1.70 (H) 12/08/2015    Recent Labs Lab 01/12/2016 1859 01/22/16 0201 01/23/16 0458  NA 145 146* 147*     A:   CKD stage 3 Recent CVVH stopped 8/3    -  ATN and severely acidotic on 01/23/16 (onset 02/06/2016); on bicarb gtt. Doubt CRRT candidate - poor prognosis.    - severe acidosis  Due to tension ptx 01/23/16 v sepsis v some combo  P:   Continue bicarb gtt rehceck lactate and bmet at 3pm Renal consult - Dr Jonnie Finner called  Avoid nephrotoxins Follow creatine  GASTROINTESTINAL A:   Elevated LFT - improved 8/13 P:   start tube feeds Check [place] of abd  HEMATOLOGIC   A:   Anticoagulation for Hx of Afib,coumadin currently on hold   01/23/16 - thrombocytopenic (was on zyvox and was on IV heparin; heparin stopped due to left chest tube) P:  Hold off IV heparin (low platelets)   INFECTIOUS A:   Staph likely MRSA bacteremia and HCAP P Id consult   ENDOCRINE A:   No acute issues P:   Monitor glucose Stress steroids due to recent steroids    NEUROLOGIC A:   Anxiety in setting of resp ditress  Deeply seated P:   RASS goal:-3 on fent gtt Add versed gtt due to severe ARDSs  GLOBAL -MODS getting worse  FAMILY  - Updates: Daughter, RN, S Minor ACNP, Dr [name] met and discussed care of Curtis Frazier. He will be transferred  to [facility] ICU for  acute decline in resp status, presumed HAP and failure to thrive.  - Inter-disciplinary family meet or Palliative Care meeting due by:  Done 01/22/16. Need to revisit goals again. He is  in multioprgan failure. Await renal consult     The patient is critically ill with multiple organ systems failure and requires high complexity decision making for assessment and support, frequent evaluation and titration of therapies, application of advanced monitoring technologies and extensive interpretation of multiple databases.   Critical Care Time devoted to patient care services described in this note is  40  Minutes. This time reflects time of care of this signee Dr Brand Males. This critical care time does not reflect procedure time, or teaching time or supervisory time of PA/NP/Med student/Med Resident etc but could involve care discussion time    Dr. Brand Males, M.D., [facility].C.P Pulmonary and Critical Care Medicine Staff Physician [facility] Pager: [phone], If no answer or between  15:00h - 7:00h: call [phone]  01/23/2016 9:02 AM

## 2016-01-23 NOTE — Progress Notes (Signed)
Daughter called and updated on procedure

## 2016-01-23 NOTE — Consult Note (Addendum)
Renal Service Consult Note [place] 01/23/2016 Sol Blazing Requesting Physician:  Dr Chase Caller  Reason for Consult:  Acute on CRF HPI: The patient is a 75 y.o. year-old with hx of RA/ psoriatic arthritis on immunosuppresion, CKD stage 3 baseline admitted at [facility] 7/24 - 01/19/16 with ARDS/HCAP/ VDRF/ shock w AKI requiring CRRT.  Had renal recovery and came off of RRT.  DC'd to [facility] w  Trach and still requiring vent support.  Got sick again and is now readmitted at [facility] in the ICU on vent with shock/ VDRF/ met acidosis and recurrent ARF with little UOP and increase met acidosis. Blood cx ID from 8/12 is + for MRSA and BP's kept dropping so is now on two vasopressors and full vent support w sedation. Asked to see for AKI.    Pt unable to give any history.     IP Meds > Teflaro IV, pepcid, Nepro TF's, solucortef, insulin SQ, Duoneb, IV flagyl, vaso/ levo gtt's, bicarb gtt at 125/ hr.    Past Medical History  Past Medical History:  Diagnosis Date  . Actinic keratosis   . Arthritis    "hips" (12/15/2015)  . Bruises easily   . CAD (coronary artery disease)    a. 2010 s/p MI/Cath/PCI ([facility]): LM 10, LAD 30p, 35m D1 20, D2 30, LCX 30p, 914m3.5 x 15 Driver BMS), 6056ERCA 70p, 3035m. 04/2011 Neg MV;  c. 04/2015 Myoview: very mild apical thinning, no ischemia, EF 65%.  . Carotid arterial disease ([place]  a. 10/2015 Carotid US:[place] 1-39% heterogeneous plaque/stenosis. Nl subclavian arteries bilat.  . Chronic back pain    scoliosis/stenosis/radiculopathy,degenerative disc disease  . Chronic diastolic CHF (congestive heart failure) ([place]  a. 08/2015 Echo: EF 55-60%, mild LVH, no rwma, Gr1 DD, mod AS, mildly dil LA, PASP 59m4m  . CKD (chronic kidney disease), stage III    a. 09/2015 AKI in setting of dehydration - seen @ [facility].  . COMarland KitchenD (chronic obstructive pulmonary disease) (HCC)[place] 12/10/2015  . Degeneration of intervertebral disc, site unspecified   .  Dyspnea on exertion   . Esophageal reflux    takes Omeprazole daily  . Gastritis 12/10  . GI bleed    a. 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  . Hemorrhoids   . History of blood transfusion 2010   "don't remember why"  . History of colonic polyps   . Hyperlipidemia    a. takes pravachol.  . Hypertensive heart disease    a. takes Metoprolol daily  . Joint pain   . Lumbar stenosis    a. s/p lumbar diskectomy 2017.  . Moderate aortic stenosis    a. 08/2015 Mod AS, valve area (VTI) - 1.5cm^2, (Vmax) - 1.25cm^2; b. 10/2015 TEE EF 55-60%, mod AS.  . NeMarland Kitchenhrolithiasis   . Other psoriasis   . Paroxysmal atrial fibrillation (HCC)[place] a. 06/2011 Post-op AF-->converted on amio;  b. 07/2015 TEE/unsuccessful DCCV x 2; c. 09/2015 recurrent AF/Flutter-->successful DCCV; d. 09/2015 Recurrent AF-->10/2015 s/p TEE (EF 55-60%, mod AS, mild MR, sev dil LA w/o LAA thrombus, sev dil RA, mild to mod TR) & DCCV (120 J x 1).  . Personal history of colonic polyps   . PMR (polymyalgia rheumatica) (HCC) 01/20/2012   tx by specialist on a long prednisone taper  . Pneumonia 2008; 07/2015  . Psoriasis   . Psoriatic arthropathy (HCC)[place]. Rheumatoid arthritis(714.0)    takes  Methotrexate 7pills weekly  . Scoliosis (and kyphoscoliosis), idiopathic   . Skin cancer    "arms; they freeze then off; cut one off left elbow recently" (12/15/2015)  . Sleep apnea    "had it while in hospital in 07/2015" (12/15/2015)  . STEMI (ST elevation myocardial infarction) ([place]) 02/2010   Archie Endo 05/2009  . Type II diabetes mellitus ([place])    Past Surgical History  Past Surgical History:  Procedure Laterality Date  . BACK SURGERY    . CARDIAC CATHETERIZATION  2010  . CARDIOVERSION N/A 08/06/2015   Procedure: CARDIOVERSION;  Surgeon: Skeet Latch, MD;  Location: [place];  Service: Cardiovascular;  Laterality: N/A;  . CARDIOVERSION N/A 09/14/2015   Procedure: CARDIOVERSION;  Surgeon: Jerline Pain, MD;  Location: [place];  Service:  Cardiovascular;  Laterality: N/A;  . CARDIOVERSION N/A 11/04/2015   Procedure: CARDIOVERSION;  Surgeon: Sanda Klein, MD;  Location: [facility] ENDOSCOPY;  Service: Cardiovascular;  Laterality: N/A;  . carotid doppler  10/12   0-39% R and 60-79% left   . CATARACT EXTRACTION W/ INTRAOCULAR LENS  IMPLANT, BILATERAL Bilateral 2012  . COLONOSCOPY    . CORONARY ANGIOPLASTY WITH STENT PLACEMENT  02/2009   has one stent  . ELECTROPHYSIOLOGIC STUDY N/A 12/15/2015   Procedure: A-Flutter;  Surgeon: Will Meredith Leeds, MD;  Location: [place] CV LAB;  Service: Cardiovascular;  Laterality: N/A;  . LAMINECTOMY THORACIC FUSION POSTERIOR  06/2011   Decompressive laminectomy at L12, L2-3, L4-5; posterior segmental agitation T10-L5 with globus titanium pedicle screws and rods; T10-11, and T11-12, T12-L1, L1-2, L2-3, L3-4, L4-5 posterior lateral arthrodesis with local morselized autograft bone, bone morphogenic protein, Actifuse bone graft extender./notes 07/03/2011  . LITHOTRIPSY  2009  . LUMBAR LAMINECTOMY  07/2015  . POSTERIOR LAMINECTOMY THORACIC SPINE     "fixed the discs"  . SHOULDER OPEN ROTATOR CUFF REPAIR Left 2000  . SKIN CANCER EXCISION     "arms"  . TEE WITHOUT CARDIOVERSION N/A 08/06/2015   Procedure: TRANSESOPHAGEAL ECHOCARDIOGRAM (TEE);  Surgeon: Skeet Latch, MD;  Location: [place];  Service: Cardiovascular;  Laterality: N/A;  . TEE WITHOUT CARDIOVERSION N/A 09/14/2015   Procedure: TRANSESOPHAGEAL ECHOCARDIOGRAM (TEE);  Surgeon: Jerline Pain, MD;  Location: [place];  Service: Cardiovascular;  Laterality: N/A;  . TEE WITHOUT CARDIOVERSION N/A 11/04/2015   Procedure: TRANSESOPHAGEAL ECHOCARDIOGRAM (TEE);  Surgeon: Sanda Klein, MD;  Location: [facility] ENDOSCOPY;  Service: Cardiovascular;  Laterality: N/A;  . VASECTOMY  1979   Family History  Family History  Problem Relation Age of Onset  . Coronary artery disease Mother   . Diabetes Mother   . CAD Mother   . Coronary artery disease  Sister   . Hypertension Sister   . Kidney failure Sister   . CAD Sister   . Diabetes Brother   . Prostate cancer Brother   . Pancreatic cancer Brother   . Diabetes Brother   . Anesthesia problems Neg Hx   . Hypotension Neg Hx   . Malignant hyperthermia Neg Hx   . Pseudochol deficiency Neg Hx    Social History  reports that he quit smoking about 7 years ago. His smoking use included Cigarettes. He has a 79.50 pack-year smoking history. He has never used smokeless tobacco. He reports that he drinks alcohol. He reports that he does not use drugs. Allergies  Allergies  Allergen Reactions  . Ace Inhibitors Other (See Comments)    Causes high K   . Angiotensin Receptor Blockers Other (See Comments)  Causes high K   . Metoprolol Other (See Comments)    May cause elevated K level   . Penicillins Swelling and Rash    Has patient had a PCN reaction causing immediate rash, facial/tongue/throat swelling, SOB or lightheadedness with hypotension: YES Has patient had a PCN reaction causing severe rash involving mucus membranes or skin necrosis: NO Has patient had a PCN reaction that required hospitalization NO Has patient had a PCN reaction occurring within the last 10 years: NO If all of the above answers are "NO", then may proceed with Cephalosporin use. Pt has tolerated cephalosporins in the past   . Amiodarone Other (See Comments)    12/27/15: Cardiologist discontinued the amiodarone (on PTA) due to  concern for toxicity  (in hospitial with acute hypoxemic respiratory failure)   . Tetracycline Swelling and Rash   Home medications Prior to Admission medications   Medication Sig Start Date End Date Taking? Authorizing Provider  acetaminophen (TYLENOL) 160 MG/5ML solution Place 20.3 mLs (650 mg total) into feeding tube every 6 (six) hours as needed for fever. 01/19/16   Erick Colace, NP  albuterol (PROVENTIL) (2.5 MG/3ML) 0.083% nebulizer solution Take 3 mLs (2.5 mg total) by  nebulization every 2 (two) hours as needed for wheezing. 01/19/16   Erick Colace, NP  arformoterol (BROVANA) 15 MCG/2ML NEBU Take 2 mLs (15 mcg total) by nebulization 2 (two) times daily. 01/19/16   Erick Colace, NP  budesonide (PULMICORT) 0.5 MG/2ML nebulizer solution Take 2 mLs (0.5 mg total) by nebulization 2 (two) times daily. 01/19/16   Erick Colace, NP  calcium-vitamin D (OSCAL WITH D) 500-200 MG-UNIT tablet Take 1 tablet by mouth daily with breakfast.    Historical Provider, MD  docusate sodium (COLACE) 100 MG capsule Take 100 mg by mouth 2 (two) times daily. Reported on 09/22/2015    Historical Provider, MD  glucose blood (ONE TOUCH ULTRA TEST) test strip Check blood sugar three times daily and as directed. Dx E11.8 diabetes that is poorly controlled 11/15/15   Abner Greenspan, MD  heparin 5000 UNIT/ML injection Inject 1 mL (5,000 Units total) into the skin every 8 (eight) hours. 01/19/16   Erick Colace, NP  insulin aspart (NOVOLOG) 100 UNIT/ML injection Inject 0-15 Units into the skin every 4 (four) hours. 01/19/16   Erick Colace, NP  metoprolol tartrate (LOPRESSOR) 25 mg/10 mL SUSP Place 5 mLs (12.5 mg total) into feeding tube 2 (two) times daily. 01/19/16   Erick Colace, NP  nystatin (MYCOSTATIN/NYSTOP) powder Apply topically 3 (three) times daily. 01/19/16   Erick Colace, NP  ondansetron [facility]) 4 MG/2ML SOLN injection Inject 2 mLs (4 mg total) into the vein every 6 (six) hours as needed for nausea or vomiting. 01/19/16   Erick Colace, NP  predniSONE 5 MG/ML concentrated solution Place 8 mLs (40 mg total) into feeding tube daily. 01/19/16   Erick Colace, NP  ranitidine (ZANTAC) 150 MG/10ML syrup Place 10 mLs (150 mg total) into feeding tube 2 (two) times daily. 01/19/16   Erick Colace, NP  Water For Irrigation, Sterile (FREE WATER) SOLN Place 200 mLs into feeding tube every 4 (four) hours. 01/19/16   Erick Colace, NP   Liver Function Tests  Recent Labs Lab 01/26/2016 0550  02/10/2016 1447 02/07/2016 1859  AST 215* 156* 148*  ALT 255* 230* 216*  ALKPHOS 745* 688* 661*  BILITOT 0.9 1.1 1.1  PROT 4.6* 4.4* 4.2*  ALBUMIN 1.5*  1.5* 1.4* 1.3*    Recent Labs Lab 01/12/2016 1447  LIPASE 96*  AMYLASE 125*   CBC  Recent Labs Lab 02/04/2016 0550  02/09/2016 1859 01/22/16 0201 01/23/16 0458 01/23/16 0959  WBC 13.8*  < > 17.5* 18.3* 13.4* 11.9*  NEUTROABS 12.7*  --  16.6*  --  12.9*  --   HGB 8.9*  < > 9.5* 9.3* 8.2* 7.2*  HCT 28.8*  < > 31.7* 31.5* 27.2* 23.5*  MCV 96.3  < > 100.6* 100.6* 98.2 94.4  PLT 124*  < > 141* 138* 99* 78*  < > = values in this interval not displayed. Basic Metabolic Panel  Recent Labs Lab 01/19/16 0345 01/20/16 0540 01/30/2016 0550 01/15/2016 1447 01/18/2016 1859 01/22/16 0201 01/23/16 0458 01/23/16 1222  NA 147* 152* 148*  149* 148* 145 146* 147*  --   K 4.0 3.9 3.4*  3.5 3.8 4.5 5.1 4.3  --   CL 116* 117* 116*  115* 119* 120* 115* 112*  --   CO2 _0 21* 19* 18*  --   GLUCOSE 197* 148* 259*  258* 284* 320* 413* 340*  --   BUN 49* 39* 37*  38* 44* 47* 54* 64*  --   CREATININE 1.38* 1.39* 1.21  1.21 1.85* 1.86* 2.35* 2.99*  --   CALCIUM 8.4* 8.8* 8.2*  8.2* 7.7* 7.2* 7.1* 6.1*  --   PHOS 2.8 2.7 2.1* 4.0  --  7.2* 6.2* 5.4*   Iron/TIBC/Ferritin/ %Sat    Component Value Date/Time   IRON 24 (L) 08/13/2015 0430   TIBC 258 08/13/2015 0430   FERRITIN 147 08/13/2015 0430   IRONPCTSAT 9 (L) 08/13/2015 0430    Vitals:   01/23/16 1100 01/23/16 1200 01/23/16 1211 01/23/16 1212  BP: 134/60     Pulse: 82 (!) 118    Resp: (!) 27 (!) 30    Temp:    98.2 F (36.8 C)  TempSrc:    Oral  SpO2: 100% 100% 100%   Weight:       Exam Gen on vent, sedated No rash, cyanosis or gangrene Sclera anicteric, throat w ETT  No jvd or bruits Chest coarse exp wheezing, o/w clear RRR no MRG, tachy Abd soft ntnd no mass or ascites +bs GU markedly edematous penis/ scrotum Ext 2+ diffuse UE and LE edema Neuro is  sedated, unresponsive on the vent  Na 147  K 4.3  CO2 18  BUN 64  Cr 2.99   Ca 6.1  Glu 340   ABG 7.29/ 47/ 120 WBC 11.9  Hb 7.2  plt 78 Vent - 60% FiO2  Assessment: 1.  Acute on CKD - recurrent related to septic shock 2.  MRSA +sepsis on vasopressors and IV abx per ID 3.  VDRF - recent admit for HCAP/ ARDS/ trach, on full vent support 4.  CKD 3, baseline creat 1.6- 1.8 5.  Met acidosis - on bicarb gtt at 125/ hr 6.  Vol overload - diffuse soft tissue edema 7.  Partial DNR    Plan - pt has recurrent renal failure related to septic shock.  He will require RRT soon if aggressive therapy is desired. Have d/w family members on two separate occassions today, I told them to discuss amongst themselves how aggressive they want to be regarding dialysis and let us know.  No need for acute RRT right now.  We reassess in am.     Kelly Splinter MD [facility]  pager [phone]    cell [phone] 01/23/2016, 1:50 PM

## 2016-01-23 NOTE — Progress Notes (Signed)
Chest tube placed. Patient tolerated well.

## 2016-01-23 NOTE — Progress Notes (Signed)
[place] Progress Note Patient Name: Curtis Frazier DOB: Jul 07, 1940 MRN: [id number]   Date of Service  01/23/2016  HPI/Events of Note  Low magnesium  eICU Interventions  Replaced with 1 gram.       Intervention Category Minor Interventions: Electrolytes abnormality - evaluation and management  Mauri Brooklyn, P 01/23/2016, 6:06 AM

## 2016-01-23 NOTE — Progress Notes (Signed)
Interdisciplinary Goals of Care Family Meeting    Date carried out:: 01/23/2016  Location of the meeting: Bedside  Member's involved: Physician, Bedside Registered Nurse, Family Member or next of kin and Other: Son Tarri Fuller Power of Tour manager: multiple    Discussion: We discussed goals of care for [employer] .  Son understands patient very ill. Says struggling between wantging him alive and around and not wanting him to suffer. STruggling to understand dramatic decline esp was driving till feb 2017 and then was walking with cane recently prior to admit 2 weeks ago at [facility]. Informed him about current MODS; he received infor but body language seemed to need time for processing. Aware renal will come by and aware CRRT might not work this time  Code status: Limited Code or DNR with short term  Disposition: Continue current acute care  Time spent for the meeting: 10 min at bedsuide  [facility] 01/23/2016 9:47 AM

## 2016-01-23 NOTE — Progress Notes (Signed)
Wife called for consent to place a chest tube in due to pneumothorax. No answer. Daughter [name] was called and verbal consent over the phone received and witnessed by rapid response nurse Robb Matar. Dr. Ancil Linsey talked to daughter and answered any questions. Dr. Allene Dillon at bed side for chest tube insertion.

## 2016-01-23 NOTE — Progress Notes (Signed)
[place] Progress Note Patient Name: Curtis Frazier DOB: 1940/06/25 MRN: [id number]   Date of Service  01/23/2016  HPI/Events of Note  New onset RAF  eICU Interventions  cardizem bolus/ drip     Intervention Category Major Interventions: Acid-Base disturbance - evaluation and management;Arrhythmia - evaluation and management  Christinia Gully 01/23/2016, 8:36 PM

## 2016-01-23 NOTE — Progress Notes (Signed)
[place] Progress Note Patient Name: Curtis Frazier DOB: 02/08/1941 MRN: [id number]   Date of Service  01/23/2016  HPI/Events of Note  Acidemia worsening.  PH 7.18 with bicarb 13.  Systolic bp now [id number].  Appears to be responding to fluid  eICU Interventions  Push 1 amp of bicarb now and continue bicarb drip     Intervention Category Major Interventions: Acid-Base disturbance - evaluation and management  Mauri Brooklyn, P 01/23/2016, 2:31 AM

## 2016-01-23 NOTE — Progress Notes (Signed)
[place] Progress Note Patient Name: Curtis Frazier DOB: 26-Feb-1941 MRN: [id number]   Date of Service  01/23/2016  HPI/Events of Note  Patient with sepsis on levophed and vasopressin with persistent hypotension.  eICU Interventions  Bolus 1 liter NS     Intervention Category Major Interventions: Hypotension - evaluation and management  Mauri Brooklyn, P 01/23/2016, 12:34 AM

## 2016-01-23 NOTE — Progress Notes (Signed)
Subjective:  On ventilator  Antibiotics:  Anti-infectives    Start     Dose/Rate Route Frequency Ordered Stop   01/22/16 1430  ceftaroline (TEFLARO) 300 mg in sodium chloride 0.9 % 250 mL IVPB     300 mg 250 mL/hr over 60 Minutes Intravenous Every 12 hours 01/22/16 1333     01/22/16 1400  metroNIDAZOLE (FLAGYL) IVPB 500 mg     500 mg 100 mL/hr over 60 Minutes Intravenous Every 8 hours 01/22/16 1333     02/08/2016 1500  linezolid (ZYVOX) IVPB 600 mg  Status:  Discontinued     600 mg 300 mL/hr over 60 Minutes Intravenous Every 12 hours 01/30/2016 1402 01/22/16 1333   01/31/2016 1500  meropenem (MERREM) 500 mg in sodium chloride 0.9 % 50 mL IVPB  Status:  Discontinued     500 mg 100 mL/hr over 30 Minutes Intravenous Every 8 hours 02/05/2016 1402 02/02/2016 1407   01/18/2016 1500  meropenem (MERREM) 1 g in sodium chloride 0.9 % 100 mL IVPB  Status:  Discontinued     1 g 200 mL/hr over 30 Minutes Intravenous Every 12 hours 01/16/2016 1407 01/22/16 1333      Medications: Scheduled Meds: . antiseptic oral rinse  7 mL Mouth Rinse QID  . ceFTAROline (TEFLARO) IV  300 mg Intravenous Q12H  . chlorhexidine gluconate (SAGE KIT)  15 mL Mouth Rinse BID  . famotidine (PEPCID) IV  20 mg Intravenous Q24H  . feeding supplement (NEPRO CARB STEADY)  1,000 mL Oral Q24H  . feeding supplement (PRO-STAT SUGAR FREE 64)  60 mL Per Tube BID  . fentaNYL (SUBLIMAZE) injection  50 mcg Intravenous Once  . free water  200 mL Per Tube Q8H  . hydrocortisone sod succinate (SOLU-CORTEF) inj  50 mg Intravenous Q6H  . insulin aspart  1-3 Units Subcutaneous Q4H  . ipratropium-albuterol  3 mL Nebulization Q6H  . metronidazole  500 mg Intravenous Q8H  . sodium chloride flush  10-40 mL Intracatheter Q12H   Continuous Infusions: . sodium chloride Stopped (01/23/16 0303)  . fentaNYL infusion INTRAVENOUS 75 mcg/hr (01/23/16 1600)  . insulin (NOVOLIN-R) infusion 1.1 mL/hr at 01/23/16 1600  . midazolam (VERSED)  infusion    . norepinephrine (LEVOPHED) Adult infusion 25 mcg/min (01/23/16 1611)  .  sodium bicarbonate  infusion 1000 mL 125 mL/hr at 01/23/16 1600  . vasopressin (PITRESSIN) infusion - *FOR SHOCK* 0.03 Units/min (01/23/16 1600)   PRN Meds:.fentaNYL, midazolam, midazolam, sodium chloride flush    Objective: Weight change:   Intake/Output Summary (Last 24 hours) at 01/23/16 1645 Last data filed at 01/23/16 1600  Gross per 24 hour  Intake          6951.79 ml  Output              385 ml  Net          6566.79 ml   Blood pressure 121/74, pulse (!) 119, temperature 98.2 F (36.8 C), temperature source Oral, resp. rate (!) 28, weight 177 lb 7.5 oz (80.5 kg), SpO2 100 %. Temp:  [98.2 F (36.8 C)-98.9 F (37.2 C)] 98.2 F (36.8 C) (08/13 1212) Pulse Rate:  [51-128] 119 (08/13 1600) Resp:  [26-33] 28 (08/13 1600) BP: (91-137)/(50-74) 121/74 (08/13 1600) SpO2:  [86 %-100 %] 100 % (08/13 1600) Arterial Line BP: (88-153)/(36-60) 139/58 (08/13 1600) FiO2 (%):  [50 %-100 %] 50 % (08/13 1600) Weight:  [177 lb 7.5 oz (80.5 kg)] 177 lb 7.5  oz (80.5 kg) (08/13 1600)  Physical Exam:  HEENT: anicteric sclera,   Cardiovascular: egular rate, normal r,  no murmur rubs or gallops Pulmonary: Coarse breath sounds Gastrointestinal:nontender Musculoskeletal: restraints Skin, ecchymoses  Neuro: nonfocal  CBC:    BMET  Recent Labs  01/23/16 0458 01/23/16 1548  NA 147* 147*  K 4.3 3.9  CL 112* 113*  CO2 18* 26  GLUCOSE 340* 94  BUN 64* 64*  CREATININE 2.99* 2.92*  CALCIUM 6.1* 6.1*     Liver Panel   Recent Labs  01/19/2016 1447 01/23/2016 1859  PROT 4.4* 4.2*  ALBUMIN 1.4* 1.3*  AST 156* 148*  ALT 230* 216*  ALKPHOS 688* 661*  BILITOT 1.1 1.1       Sedimentation Rate No results for input(s): ESRSEDRATE in the last 72 hours. C-Reactive Protein No results for input(s): CRP in the last 72 hours.  Micro Results: Recent Results (from the past 720 hour(s))  Gram  stain     Status: None   Collection Time: 12/24/15 11:25 PM  Result Value Ref Range Status   Specimen Description EXPECTORATED SPUTUM  Final   Special Requests NONE  Final   Gram Stain   Final    MODERATE WBC PRESENT,BOTH PMN AND MONONUCLEAR RARE SQUAMOUS EPITHELIAL CELLS PRESENT FEW GRAM POSITIVE COCCI FEW GRAM VARIABLE ROD    Report Status 12/25/2015 FINAL  Final  Respiratory Panel by PCR     Status: None   Collection Time: 12/25/15  9:45 AM  Result Value Ref Range Status   Adenovirus NOT DETECTED NOT DETECTED Final   Coronavirus 229E NOT DETECTED NOT DETECTED Final   Coronavirus HKU1 NOT DETECTED NOT DETECTED Final   Coronavirus NL63 NOT DETECTED NOT DETECTED Final   Coronavirus OC43 NOT DETECTED NOT DETECTED Final   Metapneumovirus NOT DETECTED NOT DETECTED Final   Rhinovirus / Enterovirus NOT DETECTED NOT DETECTED Final   Influenza A NOT DETECTED NOT DETECTED Final   Influenza A H1 NOT DETECTED NOT DETECTED Final   Influenza A H1 2009 NOT DETECTED NOT DETECTED Final   Influenza A H3 NOT DETECTED NOT DETECTED Final   Influenza B NOT DETECTED NOT DETECTED Final   Parainfluenza Virus 1 NOT DETECTED NOT DETECTED Final   Parainfluenza Virus 2 NOT DETECTED NOT DETECTED Final   Parainfluenza Virus 3 NOT DETECTED NOT DETECTED Final   Parainfluenza Virus 4 NOT DETECTED NOT DETECTED Final   Respiratory Syncytial Virus NOT DETECTED NOT DETECTED Final   Bordetella pertussis NOT DETECTED NOT DETECTED Final   Chlamydophila pneumoniae NOT DETECTED NOT DETECTED Final   Mycoplasma pneumoniae NOT DETECTED NOT DETECTED Final  MRSA PCR Screening     Status: None   Collection Time: 01/03/16  5:54 PM  Result Value Ref Range Status   MRSA by PCR NEGATIVE NEGATIVE Final    Comment:        The GeneXpert MRSA Assay (FDA approved for NASAL specimens only), is one component of a comprehensive MRSA colonization surveillance program. It is not intended to diagnose MRSA infection nor to guide  or monitor treatment for MRSA infections.   Blood Culture (routine x 2)     Status: None   Collection Time: 01/03/16  6:44 PM  Result Value Ref Range Status   Specimen Description BLOOD LEFT ANTECUBITAL  Final   Special Requests BOTTLES DRAWN AEROBIC AND ANAEROBIC 5CC  Final   Culture   Final    NO GROWTH 5 DAYS Performed at [facility]  Report Status 01/08/2016 FINAL  Final  Blood Culture (routine x 2)     Status: Abnormal   Collection Time: 01/03/16  6:50 PM  Result Value Ref Range Status   Specimen Description BLOOD LEFT HAND  Final   Special Requests BOTTLES DRAWN AEROBIC AND ANAEROBIC 5CC  Final   Culture  Setup Time   Final    GRAM POSITIVE COCCI IN CLUSTERS AEROBIC BOTTLE ONLY CRITICAL RESULT CALLED TO, READ BACK BY AND VERIFIED WITH: E. JACKSON, [facility] PHARMD AT [place] ON 01/04/16 BY C. JESSUP, MLT.    Culture (A)  Final    STAPHYLOCOCCUS SPECIES (COAGULASE NEGATIVE) THE SIGNIFICANCE OF ISOLATING THIS ORGANISM FROM A SINGLE SET OF BLOOD CULTURES WHEN MULTIPLE SETS ARE DRAWN IS UNCERTAIN. PLEASE NOTIFY THE MICROBIOLOGY DEPARTMENT WITHIN ONE WEEK IF SPECIATION AND SENSITIVITIES ARE REQUIRED. Performed at [facility]    Report Status 01/05/2016 FINAL  Final  Blood Culture ID Panel (Reflexed)     Status: Abnormal   Collection Time: 01/03/16  6:50 PM  Result Value Ref Range Status   Enterococcus species NOT DETECTED NOT DETECTED Final   Vancomycin resistance NOT DETECTED NOT DETECTED Final   Listeria monocytogenes NOT DETECTED NOT DETECTED Final   Staphylococcus species DETECTED (A) NOT DETECTED Final    Comment: CRITICAL RESULT CALLED TO, READ BACK BY AND VERIFIED WITH: [place], [facility] PHARMD AT 1840 ON 01/04/16 BY C. JESSUP, MLT.    Staphylococcus aureus NOT DETECTED NOT DETECTED Final   Methicillin resistance NOT DETECTED NOT DETECTED Final   Streptococcus species NOT DETECTED NOT DETECTED Final   Streptococcus agalactiae NOT DETECTED NOT DETECTED Final    Streptococcus pneumoniae NOT DETECTED NOT DETECTED Final   Streptococcus pyogenes NOT DETECTED NOT DETECTED Final   Acinetobacter baumannii NOT DETECTED NOT DETECTED Final   Enterobacteriaceae species NOT DETECTED NOT DETECTED Final   Enterobacter cloacae complex NOT DETECTED NOT DETECTED Final   Escherichia coli NOT DETECTED NOT DETECTED Final   Klebsiella oxytoca NOT DETECTED NOT DETECTED Final   Klebsiella pneumoniae NOT DETECTED NOT DETECTED Final   Proteus species NOT DETECTED NOT DETECTED Final   Serratia marcescens NOT DETECTED NOT DETECTED Final   Carbapenem resistance NOT DETECTED NOT DETECTED Final   Haemophilus influenzae NOT DETECTED NOT DETECTED Final   Neisseria meningitidis NOT DETECTED NOT DETECTED Final   Pseudomonas aeruginosa NOT DETECTED NOT DETECTED Final   Candida albicans NOT DETECTED NOT DETECTED Final   Candida glabrata NOT DETECTED NOT DETECTED Final   Candida krusei NOT DETECTED NOT DETECTED Final   Candida parapsilosis NOT DETECTED NOT DETECTED Final   Candida tropicalis NOT DETECTED NOT DETECTED Final    Comment: Performed at [facility]  Culture, respiratory (NON-Expectorated)     Status: None   Collection Time: 01/03/16  7:52 PM  Result Value Ref Range Status   Specimen Description THROAT  Final   Special Requests NONE  Final   Gram Stain   Final    ABUNDANT WBC PRESENT,BOTH PMN AND MONONUCLEAR RARE GRAM POSITIVE COCCI IN PAIRS RARE GRAM VARIABLE ROD RARE YEAST    Culture   Final    Consistent with normal respiratory flora. Performed at [facility]    Report Status 01/06/2016 FINAL  Final  Culture, blood (routine x 2)     Status: None   Collection Time: 01/03/16  8:45 PM  Result Value Ref Range Status   Specimen Description BLOOD RIGHT ANTECUBITAL  Final   Special Requests BOTTLES DRAWN  AEROBIC AND ANAEROBIC 5CC  Final   Culture   Final    NO GROWTH 5 DAYS Performed at [facility]    Report Status 01/08/2016 FINAL   Final  Culture, blood (routine x 2)     Status: None   Collection Time: 01/03/16  8:49 PM  Result Value Ref Range Status   Specimen Description BLOOD RIGHT HAND  Final   Special Requests BOTTLES DRAWN AEROBIC ONLY 5CC  Final   Culture   Final    NO GROWTH 5 DAYS Performed at [facility]    Report Status 01/08/2016 FINAL  Final  Urine culture     Status: None   Collection Time: 01/03/16 11:15 PM  Result Value Ref Range Status   Specimen Description URINE, CATHETERIZED  Final   Special Requests NONE  Final   Culture NO GROWTH Performed at [facility]   Final   Report Status 01/05/2016 FINAL  Final  Respiratory Panel by PCR     Status: None   Collection Time: 01/03/16 11:20 PM  Result Value Ref Range Status   Adenovirus NOT DETECTED NOT DETECTED Final   Coronavirus 229E NOT DETECTED NOT DETECTED Final   Coronavirus HKU1 NOT DETECTED NOT DETECTED Final   Coronavirus NL63 NOT DETECTED NOT DETECTED Final   Coronavirus OC43 NOT DETECTED NOT DETECTED Final   Metapneumovirus NOT DETECTED NOT DETECTED Final   Rhinovirus / Enterovirus NOT DETECTED NOT DETECTED Final   Influenza A NOT DETECTED NOT DETECTED Final   Influenza A H1 NOT DETECTED NOT DETECTED Final   Influenza A H1 2009 NOT DETECTED NOT DETECTED Final   Influenza A H3 NOT DETECTED NOT DETECTED Final   Influenza B NOT DETECTED NOT DETECTED Final   Parainfluenza Virus 1 NOT DETECTED NOT DETECTED Final   Parainfluenza Virus 2 NOT DETECTED NOT DETECTED Final   Parainfluenza Virus 3 NOT DETECTED NOT DETECTED Final   Parainfluenza Virus 4 NOT DETECTED NOT DETECTED Final   Respiratory Syncytial Virus NOT DETECTED NOT DETECTED Final   Bordetella pertussis NOT DETECTED NOT DETECTED Final   Chlamydophila pneumoniae NOT DETECTED NOT DETECTED Final   Mycoplasma pneumoniae NOT DETECTED NOT DETECTED Final    Comment: Performed at [facility]  Culture, respiratory (NON-Expectorated)     Status: None   Collection  Time: 01/06/16  9:45 AM  Result Value Ref Range Status   Specimen Description TRACHEAL ASPIRATE  Final   Special Requests Normal  Final   Gram Stain   Final    NO WBC SEEN RARE GRAM POSITIVE COCCI IN PAIRS AND CHAINS RARE YEAST RARE SQUAMOUS EPITHELIAL CELLS PRESENT    Culture   Final    Consistent with normal respiratory flora. Performed at [facility]    Report Status 01/08/2016 FINAL  Final  Pneumocystis smear by DFA     Status: None   Collection Time: 01/06/16 10:04 AM  Result Value Ref Range Status   Specimen Source-PJSRC TRACHEAL ASPIRATE  Final   Pneumocystis jiroveci Ag NEGATIVE  Final    Comment: Performed at [place] [facility]  Culture, blood (Routine X 2) w Reflex to ID Panel     Status: None   Collection Time: 01/06/16 11:37 AM  Result Value Ref Range Status   Specimen Description BLOOD RIGHT HAND  Final   Special Requests BOTTLES DRAWN AEROBIC AND ANAEROBIC  5CC  Final   Culture   Final    NO GROWTH 5 DAYS  Performed at [facility]    Report Status 01/11/2016 FINAL  Final  Culture, blood (Routine X 2) w Reflex to ID Panel     Status: None   Collection Time: 01/06/16 11:44 AM  Result Value Ref Range Status   Specimen Description BLOOD LEFT HAND  Final   Special Requests BOTTLES DRAWN AEROBIC ONLY [facility]  Final   Culture   Final    NO GROWTH 5 DAYS Performed at [facility]    Report Status 01/11/2016 FINAL  Final  Virus culture     Status: None   Collection Time: 01/11/16  5:48 PM  Result Value Ref Range Status   Viral Culture No virus isolated.  Corrected    Comment: (NOTE) Performed At: [facility] [place], [place] [id number] Lindon Romp MD BJ:[id number] CORRECTED ON 08/10 AT 1035: PREVIOUSLY REPORTED AS Comment    Source of Sample EAR  Final    Comment: RIGHT  Culture, blood (routine x 2)     Status: None   Collection Time: 01/15/16  6:46 PM  Result Value Ref Range Status   Specimen  Description BLOOD LEFT HAND  Final   Special Requests BOTTLES DRAWN AEROBIC AND ANAEROBIC 5CC  Final   Culture   Final    NO GROWTH 5 DAYS Performed at [facility]    Report Status 01/23/2016 FINAL  Final  Culture, blood (routine x 2)     Status: None   Collection Time: 01/15/16  6:53 PM  Result Value Ref Range Status   Specimen Description BLOOD LEFT ARM  Final   Special Requests BOTTLES DRAWN AEROBIC AND ANAEROBIC 10CC  Final   Culture   Final    NO GROWTH 5 DAYS Performed at [facility]    Report Status 02/04/2016 FINAL  Final  Culture, respiratory (NON-Expectorated)     Status: None   Collection Time: 01/19/16 11:21 AM  Result Value Ref Range Status   Specimen Description TRACHEAL ASPIRATE  Final   Special Requests NONE  Final   Gram Stain   Final    ABUNDANT WBC PRESENT,BOTH PMN AND MONONUCLEAR MODERATE SQUAMOUS EPITHELIAL CELLS PRESENT MODERATE GRAM NEGATIVE RODS MODERATE GRAM POSITIVE COCCI IN PAIRS IN CLUSTERS RARE YEAST RARE GRAM POSITIVE RODS Performed at [place]  Final   Report Status 01/22/2016 FINAL  Final   Organism ID, Bacteria STENOTROPHOMONAS MALTOPHILIA  Final      Susceptibility   Stenotrophomonas maltophilia - MIC*    LEVOFLOXACIN 2 SENSITIVE Sensitive     TRIMETH/SULFA <=20 SENSITIVE Sensitive     * MODERATE STENOTROPHOMONAS MALTOPHILIA  C difficile quick scan w PCR reflex     Status: None   Collection Time: 01/19/16  8:50 PM  Result Value Ref Range Status   C Diff antigen NEGATIVE NEGATIVE Final   C Diff toxin NEGATIVE NEGATIVE Final   C Diff interpretation No C. difficile detected.  Final  Culture, blood (routine x 2)     Status: Abnormal (Preliminary result)   Collection Time: 01/16/2016 10:00 AM  Result Value Ref Range Status   Specimen Description BLOOD RIGHT ANTECUBITAL  Final   Special Requests IN PEDIATRIC BOTTLE 2CC  Final   Culture  Setup Time   Final    GRAM  POSITIVE COCCI IN CLUSTERS AEROBIC BOTTLE ONLY Organism ID to follow CRITICAL RESULT CALLED TO, READ BACK BY AND VERIFIED WITHAlona Bene PHARMD 0654 01/22/16  A BROWNING    Culture STAPHYLOCOCCUS AUREUS (A)  Final   Report Status PENDING  Incomplete  Blood Culture ID Panel (Reflexed)     Status: Abnormal   Collection Time: 02/08/2016 10:00 AM  Result Value Ref Range Status   Enterococcus species NOT DETECTED NOT DETECTED Final   Vancomycin resistance NOT DETECTED NOT DETECTED Final   Listeria monocytogenes NOT DETECTED NOT DETECTED Final   Staphylococcus species DETECTED (A) NOT DETECTED Final    Comment: CRITICAL RESULT CALLED TO, READ BACK BY AND VERIFIED WITH: V BRYK PHARMD 0654 01/22/16 A BROWNING    Staphylococcus aureus DETECTED (A) NOT DETECTED Final    Comment: CRITICAL RESULT CALLED TO, READ BACK BY AND VERIFIED WITH: V BRYK PHARMD 0654 01/22/16 A BROWNING    Methicillin resistance DETECTED (A) NOT DETECTED Final    Comment: CRITICAL RESULT CALLED TO, READ BACK BY AND VERIFIED WITH: V BRYK PHARMD 0654 01/22/16 A BROWNING    Streptococcus species NOT DETECTED NOT DETECTED Final   Streptococcus agalactiae NOT DETECTED NOT DETECTED Final   Streptococcus pneumoniae NOT DETECTED NOT DETECTED Final   Streptococcus pyogenes NOT DETECTED NOT DETECTED Final   Acinetobacter baumannii NOT DETECTED NOT DETECTED Final   Enterobacteriaceae species NOT DETECTED NOT DETECTED Final   Enterobacter cloacae complex NOT DETECTED NOT DETECTED Final   Escherichia coli NOT DETECTED NOT DETECTED Final   Klebsiella oxytoca NOT DETECTED NOT DETECTED Final   Klebsiella pneumoniae NOT DETECTED NOT DETECTED Final   Proteus species NOT DETECTED NOT DETECTED Final   Serratia marcescens NOT DETECTED NOT DETECTED Final   Carbapenem resistance NOT DETECTED NOT DETECTED Final   Haemophilus influenzae NOT DETECTED NOT DETECTED Final   Neisseria meningitidis NOT DETECTED NOT DETECTED Final   Pseudomonas aeruginosa  NOT DETECTED NOT DETECTED Final   Candida albicans NOT DETECTED NOT DETECTED Final   Candida glabrata NOT DETECTED NOT DETECTED Final   Candida krusei NOT DETECTED NOT DETECTED Final   Candida parapsilosis NOT DETECTED NOT DETECTED Final   Candida tropicalis NOT DETECTED NOT DETECTED Final  Culture, blood (routine x 2)     Status: None (Preliminary result)   Collection Time: 02/10/2016 10:10 AM  Result Value Ref Range Status   Specimen Description BLOOD RIGHT ANTECUBITAL  Final   Special Requests IN PEDIATRIC BOTTLE 1CC  Final   Culture NO GROWTH 2 DAYS  Final   Report Status PENDING  Incomplete  Culture, bal-quantitative     Status: None   Collection Time: 01/11/2016 12:11 PM  Result Value Ref Range Status   Specimen Description BRONCHIAL ALVEOLAR LAVAGE  Final   Special Requests IMMUNOSUPPRESSED  Final   Gram Stain   Final    ABUNDANT WBC PRESENT, PREDOMINANTLY PMN ABUNDANT GRAM POSITIVE COCCI IN CLUSTERS IN CHAINS FEW GRAM NEGATIVE RODS    Culture MULTIPLE ORGANISMS PRESENT, NONE PREDOMINANT  Final   Report Status 01/23/2016 FINAL  Final  Stat Gram stain     Status: None   Collection Time: 01/16/2016  1:11 PM  Result Value Ref Range Status   Specimen Description BRONCHIAL ALVEOLAR LAVAGE  Final   Special Requests NONE  Final   Gram Stain   Final    ABUNDANT WBC PRESENT, PREDOMINANTLY PMN ABUNDANT GRAM POSITIVE COCCI IN CLUSTERS IN CHAINS FEW GRAM NEGATIVE RODS    Report Status 01/15/2016 FINAL  Final  Urine culture     Status: None   Collection Time: 02/08/2016  2:34 PM  Result Value Ref Range Status  Specimen Description URINE, CATHETERIZED  Final   Special Requests NONE  Final   Culture NO GROWTH  Final   Report Status 01/22/2016 FINAL  Final  Culture, blood (routine x 2)     Status: Abnormal (Preliminary result)   Collection Time: 02/07/2016  2:45 PM  Result Value Ref Range Status   Specimen Description BLOOD RIGHT HAND  Final   Special Requests IN PEDIATRIC BOTTLE 1CC   Final   Culture  Setup Time   Final    GRAM POSITIVE COCCI IN CLUSTERS AEROBIC BOTTLE ONLY CRITICAL VALUE NOTED.  VALUE IS CONSISTENT WITH PREVIOUSLY REPORTED AND CALLED VALUE.    Culture STAPHYLOCOCCUS AUREUS (A)  Final   Report Status PENDING  Incomplete  Culture, blood (routine x 2)     Status: Abnormal (Preliminary result)   Collection Time: 01/16/2016  2:52 PM  Result Value Ref Range Status   Specimen Description BLOOD RIGHT ANTECUBITAL  Final   Special Requests IN PEDIATRIC BOTTLE 2CC  Final   Culture  Setup Time   Final    GRAM POSITIVE COCCI IN CLUSTERS AEROBIC BOTTLE ONLY CRITICAL RESULT CALLED TO, READ BACK BY AND VERIFIED WITH: C BALL,PHARMD AT 1001 01/22/16 BY L BENFIELD    Culture STAPHYLOCOCCUS AUREUS (A)  Final   Report Status PENDING  Incomplete    Studies/Results: US Abdomen Complete  Result Date: 02/08/2016 CLINICAL DATA:  Inpatient. Two days of abdominal pain. Elevated liver function tests. Patient is on a ventilator. EXAM: ABDOMEN ULTRASOUND COMPLETE COMPARISON:  01/16/2016 CT abdomen/pelvis. FINDINGS: Gallbladder: Mildly distended gallbladder contains layering calcified shadowing gallstones and a small amount of sludge with gallstones measuring up to 9 mm in size. No gallbladder wall thickening or pericholecystic fluid. Sonographic Murphy sign cannot be assessed on this ventilated patient. Common bile duct: Diameter: 5 mm Liver: No focal lesion identified. Within normal limits in parenchymal echogenicity. IVC: No abnormality visualized. Pancreas: Visualized portion unremarkable. Spleen: Size and appearance within normal limits. Right Kidney: Length: 11.4 cm. Mildly echogenic right kidney. No mass or hydronephrosis visualized. Left Kidney: Length: 12.1 cm. Mildly echogenic left kidney. No hydronephrosis. Nonobstructing 4 mm stone in the mid to lower left kidney. Limited visualization of a 1.2 cm renal cyst in the upper left kidney. Abdominal aorta: No aneurysm visualized.  Other findings: Small left pleural effusion. No upper abdominal ascites. IMPRESSION: 1. Cholelithiasis. No gallbladder wall thickening. No pericholecystic fluid. Cannot assess sonographic Murphy's sign on this ventilated patient. No specific findings of acute cholecystitis. 2. No biliary ductal dilatation. 3. Nonobstructing left renal stone.  No hydronephrosis. 4. Mildly echogenic kidneys, indicating nonspecific renal parenchymal disease of uncertain chronicity. 5. Small left pleural effusion. Electronically Signed   By: Ilona Sorrel M.D.   On: 01/13/2016 20:09   Dg Chest Port 1 View  Result Date: 01/23/2016 CLINICAL DATA:  Chest tube EXAM: PORTABLE CHEST 1 VIEW COMPARISON:  01/23/2016 FINDINGS: Tracheostomy remains in good position. NG tube enters the stomach with the tip not visualized. Large left pneumothorax unchanged from earlier today. Interval placement of small bore chest tube catheter anteriorly on the left without significant change in pneumothorax. Mild shift of the right suggesting tension. Left lower lobe collapse unchanged. Right lower lobe atelectasis unchanged. IMPRESSION: Small bore chest tube on the left without significant change in large left tension pneumothorax. Electronically Signed   By: Franchot Gallo M.D.   On: 01/23/2016 07:53   Dg Chest Port 1 View  Result Date: 01/23/2016 CLINICAL DATA:  Left chest tube  EXAM: PORTABLE CHEST 1 VIEW COMPARISON:  Chest radiograph from earlier today. FINDINGS: Left apical chest tube is in place. Tracheostomy tube tip overlies the tracheal air column at the thoracic inlet. Enteric tube enters stomach with the tip not seen on this image. Stable cardiomediastinal silhouette with top-normal heart size. No appreciable residual pneumothorax. No residual mediastinal shift. No pleural effusion. Patchy opacities throughout both lungs, most prominent at the lung bases, not definitely changed. Extensive subcutaneous emphysema in the left chest wall appears  increased. Partially visualized bilateral spinal fusion hardware overlies the lower thoracic spine. IMPRESSION: 1. No residual left pneumothorax status post left chest tube placement. No residual mediastinal shift. Prominent subcutaneous emphysema in the left chest wall. 2. Patchy opacities throughout both lungs, not appreciably changed, most prominent at the lung bases, suspicious for multifocal pneumonia. Electronically Signed   By: Ilona Sorrel M.D.   On: 01/23/2016 07:32   Dg Chest Port 1 View  Result Date: 01/23/2016 CLINICAL DATA:  Acute onset of shortness of breath. Initial encounter. EXAM: PORTABLE CHEST 1 VIEW COMPARISON:  Chest radiograph performed 02/07/2016 FINDINGS: The patient's tracheostomy tube is seen ending 7 cm above the carina. There is a very large left-sided pneumothorax, with mild rightward mediastinal shift, concerning for some degree of tension. Right-sided atelectasis and vascular congestion are seen. Mild underlying airspace opacity may reflect interstitial edema or pneumonia. No pleural effusion is identified. The cardiomediastinal silhouette remains normal in size. Thoracolumbar spinal fusion hardware is partially imaged. No acute osseous abnormalities are seen. An enteric tube is noted extending below the diaphragm. IMPRESSION: 1. Tracheostomy tube seen ending 7 cm above the carina. 2. New very large left-sided pneumothorax, with mild rightward mediastinal shift, concerning for some degree of tension. 3. Right-sided atelectasis and vascular congestion seen. Mild underlying airspace opacity may reflect interstitial edema or pneumonia. Critical Value/emergent results were called by telephone at the time of interpretation on 01/23/2016 at 5:26 am to Dr. Tamala Julian, who verbally acknowledged these results. Electronically Signed   By: Garald Balding M.D.   On: 01/23/2016 05:41      Assessment/Plan:  INTERVAL HISTORY:   Patient with PTX sp chest tube  Now growing stenotrophomonas  from tracheal aspirate   Active Problems:   Respiratory failure (HCC)   Pressure ulcer   MRSA bacteremia   Staphylococcus aureus bacteremia with sepsis (HCC)   Protein-calorie malnutrition, severe    MACYN REMMERT is a 75 y.o. male with   75 y.o. 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 but to try to cover this organism that may be only a colonizer and is of dubious clinical signficance here poses risk  FQ --> QT prolongation and the QT is already prolonged  Bactrim --> worsening renal failure and his renal function is deteriorating already and I do not want to ppt need for HD  Ideally he should have his central line removed along with his arterial ilne (at least they are newer ones placed day of + blood cultures, undoubtedly there seeded but at least they are newer than the ones he had prior to  crashing)   #2                                                                          [facility] Staphylococcus aureus bacteremia  Staphylococcus aureus bacteremia (SAB) is associated with a high rate of complications and mortality.  Specific aspects of clinical management are critical to optimizing the outcome of patients with SAB.  Therefore, the [facility]) has initiated an intervention aimed at improving the management of SAB at [facility].  To do so, Infectious Diseases physicians are providing an evidence-based consult for the management of all patients with SAB.     Yes No Comments  Perform follow-up blood cultures (even if the  patient is afebrile) to ensure clearance of bacteremia [x]  []  I have repeated blood cultures TODAY for some reason not done yesterday y and I will order repeat blood cultures oe week and give him a "central line holiday if he can survive to have this done   Remove vascular catheter and obtain follow-up blood cultures after the removal of the catheter [x]  []  SEE ABOVE DISCUSSION in ideal world he needs a "line holiday"  Perform echocardiography to evaluate for endocarditis (transthoracic ECHO is 40-50% sensitive, TEE is > 90% sensitive) []  []  Please keep in mind, that neither test can definitively EXCLUDE endocarditis, and that should clinical suspicion remain high for endocarditis the patient should then still be treated with an "endocarditis" duration of therapy = 6 weeks  TTE ordered  Consult electrophysiologist to evaluate implanted cardiac device (pacemaker, ICD) []  []    Ensure source control []  []  Have all abscesses been drained effectively? Have deep seeded infections (septic joints or osteomyelitis) had appropriate surgical debridement?  Investigate for "metastatic" sites of infection []  []  Does the patient have ANY symptom or physical exam finding that would suggest a deeper infection (back or neck pain that may be suggestive of vertebral osteomyelitis or epidural abscess, muscle pain that could be a symptom of pyomyositis)?  Keep in mind that for deep seeded infections MRI imaging with contrast is preferred rather than other often insensitive tests such as plain x-rays, especially early in a patient's presentation. Hard to do a this point  Change antibiotic therapy to teflaro []  []  Beta-lactam antibiotics are preferred for MSSA due to higher cure rates.   If on Vancomycin, goal trough should be 15 - 20 mcg/mL  Estimated duration of IV antibiotic therapy:    6 weeks []  []  Consult case management for probably prolonged outpatient IV antibiotic therapy    #3 Prognosis is very poor:  I  believe family will likely go to pure comfort measures and would counsel in favor of this. I encouraged son to continue conversations with family when I saw him this am.         LOS: 2 days   Alcide Evener 01/23/2016, 4:45 PM

## 2016-01-23 NOTE — Progress Notes (Signed)
CRITICAL VALUE ALERT  Critical value received:  Lactic acid  Date of notification:  01/23/16   Time of notification:  12:40 AM   Critical value read back:Yes.    Nurse who received alert:  Jabier Gauss   MD notified (1st page):  Dr. Tamala Julian  Time of first page: 12:41 AM

## 2016-01-23 NOTE — Procedures (Signed)
Chest Tube Insertion Procedure Note  Indications:  Clinically significant Pneumothorax  Pre-operative Diagnosis: Pneumothorax  Post-operative Diagnosis: Pneumothorax  Procedure Details  Informed consent was obtained for the procedure, including sedation.  Risks of lung perforation, hemorrhage, arrhythmia, and adverse drug reaction were discussed.   Ultrasound was used to identify an area of lung that was not expanded.  After sterile skin prep, using standard technique, a 8.5 [place] triple lumen tube was placed in the left anterior 5th rib space. This catheter was selected as the patient was fully anticoagulated on heparin gtt. Minimal air flow was obtained with this catheter, and a chest xray showed minimal lung expansion.  After 45 minutes, this tube was exchanged over a wire for a 14 french tube, which had excellent lung expansion.    Findings: None  Estimated Blood Loss:  Minimal         Specimens:  None              Complications:  None; patient tolerated the procedure well.         Disposition: ICU - intubated and critically ill.         Condition: unstable  Attending Attestation: I was present and scrubbed for the entire procedure.  Luz Brazen, MD Pulmonary & Critical Care Medicine January 23, 2016, 7:00 AM

## 2016-01-23 NOTE — Progress Notes (Signed)
[place] Progress Note Patient Name: Curtis Frazier DOB: 1940-09-01 MRN: [id number]   Date of Service  01/23/2016  HPI/Events of Note  Radiologist reported patient to have a large left sided pneumothorax  eICU Interventions  Film reviewed Fellow contacted and chest tube to be placed     Intervention Category Major Interventions: Hypotension - evaluation and management  Mauri Brooklyn, P 01/23/2016, 5:30 AM

## 2016-01-23 NOTE — Progress Notes (Signed)
[place] Progress Note Patient Name: [name] DOB: 1940-10-27 MRN: [id number]   Date of Service  01/23/2016  HPI/Events of Note  Persistent hypotension.  Systolic bp 93 with MAP 57.  On Max dose levophed and vasopressin drips. Last lactate 8.7  eICU Interventions  Bolus a second liter of NS Repeat lactate in am Consider epi drip if hypotension persists     Intervention Category Major Interventions: Hypotension - evaluation and management  Mauri Brooklyn, P 01/23/2016, 1:50 AM

## 2016-01-23 NOTE — Progress Notes (Signed)
Pt in noticed to be in afib 130-140s, completed EKG which shows afib RVR. Dr. Melvyn Novas notified.

## 2016-01-24 ENCOUNTER — Inpatient Hospital Stay (HOSPITAL_COMMUNITY): Payer: Medicare Other

## 2016-01-24 DIAGNOSIS — Z978 Presence of other specified devices: Secondary | ICD-10-CM

## 2016-01-24 DIAGNOSIS — Z9911 Dependence on respirator [ventilator] status: Secondary | ICD-10-CM

## 2016-01-24 DIAGNOSIS — Z7189 Other specified counseling: Secondary | ICD-10-CM

## 2016-01-24 DIAGNOSIS — I4891 Unspecified atrial fibrillation: Secondary | ICD-10-CM

## 2016-01-24 DIAGNOSIS — A498 Other bacterial infections of unspecified site: Secondary | ICD-10-CM

## 2016-01-24 DIAGNOSIS — Z515 Encounter for palliative care: Secondary | ICD-10-CM

## 2016-01-24 DIAGNOSIS — N179 Acute kidney failure, unspecified: Secondary | ICD-10-CM

## 2016-01-24 DIAGNOSIS — R509 Fever, unspecified: Secondary | ICD-10-CM

## 2016-01-24 DIAGNOSIS — Z1624 Resistance to multiple antibiotics: Secondary | ICD-10-CM

## 2016-01-24 LAB — BLOOD GAS, ARTERIAL
ACID-BASE EXCESS: 0.6 mmol/L (ref 0.0–2.0)
Bicarbonate: 25.4 mEq/L — ABNORMAL HIGH (ref 20.0–24.0)
DRAWN BY: 36277
FIO2: 40
O2 SAT: 88.6 %
PATIENT TEMPERATURE: 100.2
PCO2 ART: 47.5 mmHg — AB (ref 35.0–45.0)
PEEP: 8 cmH2O
PH ART: 7.352 (ref 7.350–7.450)
PO2 ART: 68.3 mmHg — AB (ref 80.0–100.0)
RATE: 28 resp/min
TCO2: 26.8 mmol/L (ref 0–100)
VT: 500 mL

## 2016-01-24 LAB — GLUCOSE, CAPILLARY
GLUCOSE-CAPILLARY: 100 mg/dL — AB (ref 65–99)
GLUCOSE-CAPILLARY: 117 mg/dL — AB (ref 65–99)
GLUCOSE-CAPILLARY: 126 mg/dL — AB (ref 65–99)
GLUCOSE-CAPILLARY: 134 mg/dL — AB (ref 65–99)
GLUCOSE-CAPILLARY: 141 mg/dL — AB (ref 65–99)
GLUCOSE-CAPILLARY: 144 mg/dL — AB (ref 65–99)
GLUCOSE-CAPILLARY: 152 mg/dL — AB (ref 65–99)
GLUCOSE-CAPILLARY: 155 mg/dL — AB (ref 65–99)
GLUCOSE-CAPILLARY: 167 mg/dL — AB (ref 65–99)
GLUCOSE-CAPILLARY: 168 mg/dL — AB (ref 65–99)
GLUCOSE-CAPILLARY: 170 mg/dL — AB (ref 65–99)
GLUCOSE-CAPILLARY: 181 mg/dL — AB (ref 65–99)
GLUCOSE-CAPILLARY: 196 mg/dL — AB (ref 65–99)
GLUCOSE-CAPILLARY: 259 mg/dL — AB (ref 65–99)
GLUCOSE-CAPILLARY: 283 mg/dL — AB (ref 65–99)
GLUCOSE-CAPILLARY: 309 mg/dL — AB (ref 65–99)
GLUCOSE-CAPILLARY: 350 mg/dL — AB (ref 65–99)
GLUCOSE-CAPILLARY: 360 mg/dL — AB (ref 65–99)
GLUCOSE-CAPILLARY: 394 mg/dL — AB (ref 65–99)
GLUCOSE-CAPILLARY: 413 mg/dL — AB (ref 65–99)
GLUCOSE-CAPILLARY: 444 mg/dL — AB (ref 65–99)
GLUCOSE-CAPILLARY: 78 mg/dL (ref 65–99)
GLUCOSE-CAPILLARY: 91 mg/dL (ref 65–99)
GLUCOSE-CAPILLARY: 95 mg/dL (ref 65–99)
Glucose-Capillary: 115 mg/dL — ABNORMAL HIGH (ref 65–99)
Glucose-Capillary: 118 mg/dL — ABNORMAL HIGH (ref 65–99)
Glucose-Capillary: 121 mg/dL — ABNORMAL HIGH (ref 65–99)
Glucose-Capillary: 128 mg/dL — ABNORMAL HIGH (ref 65–99)
Glucose-Capillary: 129 mg/dL — ABNORMAL HIGH (ref 65–99)
Glucose-Capillary: 132 mg/dL — ABNORMAL HIGH (ref 65–99)
Glucose-Capillary: 151 mg/dL — ABNORMAL HIGH (ref 65–99)
Glucose-Capillary: 152 mg/dL — ABNORMAL HIGH (ref 65–99)
Glucose-Capillary: 154 mg/dL — ABNORMAL HIGH (ref 65–99)
Glucose-Capillary: 166 mg/dL — ABNORMAL HIGH (ref 65–99)
Glucose-Capillary: 178 mg/dL — ABNORMAL HIGH (ref 65–99)
Glucose-Capillary: 202 mg/dL — ABNORMAL HIGH (ref 65–99)
Glucose-Capillary: 219 mg/dL — ABNORMAL HIGH (ref 65–99)
Glucose-Capillary: 225 mg/dL — ABNORMAL HIGH (ref 65–99)
Glucose-Capillary: 245 mg/dL — ABNORMAL HIGH (ref 65–99)
Glucose-Capillary: 271 mg/dL — ABNORMAL HIGH (ref 65–99)
Glucose-Capillary: 275 mg/dL — ABNORMAL HIGH (ref 65–99)
Glucose-Capillary: 391 mg/dL — ABNORMAL HIGH (ref 65–99)
Glucose-Capillary: 454 mg/dL — ABNORMAL HIGH (ref 65–99)
Glucose-Capillary: 99 mg/dL (ref 65–99)

## 2016-01-24 LAB — ECHOCARDIOGRAM LIMITED: Weight: 2871.27 oz

## 2016-01-24 LAB — PHOSPHORUS
PHOSPHORUS: 4.7 mg/dL — AB (ref 2.5–4.6)
Phosphorus: 5.4 mg/dL — ABNORMAL HIGH (ref 2.5–4.6)

## 2016-01-24 LAB — CBC WITH DIFFERENTIAL/PLATELET
BAND NEUTROPHILS: 0 %
BASOS ABS: 0 10*3/uL (ref 0.0–0.1)
BASOS PCT: 0 %
BLASTS: 0 %
Eosinophils Absolute: 0 10*3/uL (ref 0.0–0.7)
Eosinophils Relative: 0 %
HEMATOCRIT: 24.1 % — AB (ref 39.0–52.0)
Hemoglobin: 7.7 g/dL — ABNORMAL LOW (ref 13.0–17.0)
LYMPHS ABS: 0.3 10*3/uL — AB (ref 0.7–4.0)
LYMPHS PCT: 2 %
MCH: 29.4 pg (ref 26.0–34.0)
MCHC: 32 g/dL (ref 30.0–36.0)
MCV: 92 fL (ref 78.0–100.0)
MONOS PCT: 1 %
Metamyelocytes Relative: 0 %
Monocytes Absolute: 0.1 10*3/uL (ref 0.1–1.0)
Myelocytes: 0 %
NEUTROS ABS: 14.5 10*3/uL — AB (ref 1.7–7.7)
NEUTROS PCT: 97 %
NRBC: 0 /100{WBCs}
PLATELETS: 74 10*3/uL — AB (ref 150–400)
PROMYELOCYTES ABS: 0 %
RBC: 2.62 MIL/uL — ABNORMAL LOW (ref 4.22–5.81)
RDW: 19.9 % — AB (ref 11.5–15.5)
WBC: 14.9 10*3/uL — ABNORMAL HIGH (ref 4.0–10.5)

## 2016-01-24 LAB — CULTURE, BLOOD (ROUTINE X 2)

## 2016-01-24 LAB — POCT I-STAT 3, ART BLOOD GAS (G3+)
Acid-base deficit: 4 mmol/L — ABNORMAL HIGH (ref 0.0–2.0)
Acid-base deficit: 8 mmol/L — ABNORMAL HIGH (ref 0.0–2.0)
Acid-base deficit: 8 mmol/L — ABNORMAL HIGH (ref 0.0–2.0)
BICARBONATE: 19.8 meq/L — AB (ref 20.0–24.0)
BICARBONATE: 17.9 meq/L — AB (ref 20.0–24.0)
Bicarbonate: 23.4 mEq/L (ref 20.0–24.0)
O2 SAT: 99 %
O2 SAT: 93 %
O2 Saturation: 93 %
PCO2 ART: 53.9 mmHg — AB (ref 35.0–45.0)
PCO2 ART: 50.7 mmHg — AB (ref 35.0–45.0)
PCO2 ART: 38.3 mmHg (ref 35.0–45.0)
PH ART: 7.25 — AB (ref 7.350–7.450)
PO2 ART: 143 mmHg — AB (ref 80.0–100.0)
PO2 ART: 83 mmHg (ref 80.0–100.0)
PO2 ART: 75 mmHg — AB (ref 80.0–100.0)
Patient temperature: 100.1
Patient temperature: 99.7
Patient temperature: 98.4
TCO2: 25 mmol/L (ref 0–100)
TCO2: 21 mmol/L (ref 0–100)
TCO2: 19 mmol/L (ref 0–100)
pH, Arterial: 7.203 — ABNORMAL LOW (ref 7.350–7.450)
pH, Arterial: 7.277 — ABNORMAL LOW (ref 7.350–7.450)

## 2016-01-24 LAB — BASIC METABOLIC PANEL
ANION GAP: 11 (ref 5–15)
BUN: 67 mg/dL — AB (ref 6–20)
CALCIUM: 6.2 mg/dL — AB (ref 8.9–10.3)
CO2: 27 mmol/L (ref 22–32)
CREATININE: 2.95 mg/dL — AB (ref 0.61–1.24)
Chloride: 108 mmol/L (ref 101–111)
GFR calc Af Amer: 22 mL/min — ABNORMAL LOW (ref >60)
GFR, EST NON AFRICAN AMERICAN: 19 mL/min — AB (ref >60)
GLUCOSE: 179 mg/dL — AB (ref 65–99)
Potassium: 3.6 mmol/L (ref 3.5–5.1)
Sodium: 146 mmol/L — ABNORMAL HIGH (ref 135–145)

## 2016-01-24 LAB — MAGNESIUM
MAGNESIUM: 1.3 mg/dL — AB (ref 1.7–2.4)
Magnesium: 1.5 mg/dL — ABNORMAL LOW (ref 1.7–2.4)

## 2016-01-24 MED ORDER — DEXTROSE 10 % IV SOLN: INTRAVENOUS | Status: DC | PRN

## 2016-01-24 MED ORDER — INSULIN ASPART 100 UNIT/ML ~~LOC~~ SOLN: 2.0000 [IU] | SUBCUTANEOUS | Status: DC

## 2016-01-24 MED ORDER — INSULIN ASPART 100 UNIT/ML ~~LOC~~ SOLN
0.0000 [IU] | SUBCUTANEOUS | Status: DC
  Administered 2016-01-25: 3 [IU] via SUBCUTANEOUS
  Administered 2016-01-25: 2 [IU] via SUBCUTANEOUS
  Administered 2016-01-25: 3 [IU] via SUBCUTANEOUS

## 2016-01-24 MED ORDER — INSULIN NPH (HUMAN) (ISOPHANE) 100 UNIT/ML ~~LOC~~ SUSP
7.0000 [IU] | Freq: Two times a day (BID) | SUBCUTANEOUS | Status: DC
  Administered 2016-01-24 – 2016-01-25 (×2): 7 [IU] via SUBCUTANEOUS
  Filled 2016-01-24: qty 10

## 2016-01-24 MED ORDER — SODIUM CHLORIDE 0.9 % IV SOLN
1.0000 g | Freq: Once | INTRAVENOUS | Status: AC
  Administered 2016-01-24: 1 g via INTRAVENOUS
  Filled 2016-01-24: qty 10

## 2016-01-24 MED ORDER — MAGNESIUM SULFATE 4 GM/100ML IV SOLN
4.0000 g | Freq: Once | INTRAVENOUS | Status: AC
  Administered 2016-01-24: 4 g via INTRAVENOUS
  Filled 2016-01-24: qty 100

## 2016-01-24 MED ORDER — INSULIN GLARGINE 100 UNIT/ML ~~LOC~~ SOLN
40.0000 [IU] | SUBCUTANEOUS | Status: DC
  Filled 2016-01-24: qty 0.4

## 2016-01-24 NOTE — Progress Notes (Signed)
  Echocardiogram 2D Echocardiogram has been performed.  Curtis Frazier 01/24/2016, 1:05 PM

## 2016-01-24 NOTE — Progress Notes (Signed)
Subjective:  On ventilator  Antibiotics:  Anti-infectives    Start     Dose/Rate Route Frequency Ordered Stop   01/22/16 1430  ceftaroline (TEFLARO) 300 mg in sodium chloride 0.9 % 250 mL IVPB     300 mg 250 mL/hr over 60 Minutes Intravenous Every 12 hours 01/22/16 1333     01/22/16 1400  metroNIDAZOLE (FLAGYL) IVPB 500 mg     500 mg 100 mL/hr over 60 Minutes Intravenous Every 8 hours 01/22/16 1333     02/01/2016 1500  linezolid (ZYVOX) IVPB 600 mg  Status:  Discontinued     600 mg 300 mL/hr over 60 Minutes Intravenous Every 12 hours 02/07/2016 1402 01/22/16 1333   01/16/2016 1500  meropenem (MERREM) 500 mg in sodium chloride 0.9 % 50 mL IVPB  Status:  Discontinued     500 mg 100 mL/hr over 30 Minutes Intravenous Every 8 hours 01/18/2016 1402 01/31/2016 1407   01/23/2016 1500  meropenem (MERREM) 1 g in sodium chloride 0.9 % 100 mL IVPB  Status:  Discontinued     1 g 200 mL/hr over 30 Minutes Intravenous Every 12 hours 01/12/2016 1407 01/22/16 1333      Medications: Scheduled Meds: . antiseptic oral rinse  7 mL Mouth Rinse QID  . ceFTAROline (TEFLARO) IV  300 mg Intravenous Q12H  . chlorhexidine gluconate (SAGE KIT)  15 mL Mouth Rinse BID  . famotidine (PEPCID) IV  20 mg Intravenous Q24H  . feeding supplement (NEPRO CARB STEADY)  1,000 mL Oral Q24H  . feeding supplement (PRO-STAT SUGAR FREE 64)  60 mL Per Tube BID  . fentaNYL (SUBLIMAZE) injection  50 mcg Intravenous Once  . free water  200 mL Per Tube Q8H  . hydrocortisone sod succinate (SOLU-CORTEF) inj  50 mg Intravenous Q6H  . insulin aspart  1-3 Units Subcutaneous Q4H  . insulin aspart  2-6 Units Subcutaneous Q4H  . insulin glargine  40 Units Subcutaneous Q24H  . ipratropium-albuterol  3 mL Nebulization Q6H  . metronidazole  500 mg Intravenous Q8H  . sodium chloride flush  10-40 mL Intracatheter Q12H   Continuous Infusions: . dextrose    . diltiazem (CARDIZEM) infusion 10 mg/hr (01/24/16 1816)  . fentaNYL infusion  INTRAVENOUS 60 mcg/hr (01/24/16 0930)  . insulin (NOVOLIN-R) infusion 2.8 Units/hr (01/24/16 0734)  . midazolam (VERSED) infusion    . norepinephrine (LEVOPHED) Adult infusion 15 mcg/min (01/24/16 0600)  . phenylephrine (NEO-SYNEPHRINE) Adult infusion    .  sodium bicarbonate  infusion 1000 mL 125 mL/hr at 01/23/16 2346  . vasopressin (PITRESSIN) infusion - *FOR SHOCK* 0.03 Units/min (01/23/16 1800)   PRN Meds:.dextrose, fentaNYL, midazolam, midazolam, sodium chloride flush    Objective: Weight change:   Intake/Output Summary (Last 24 hours) at 01/24/16 1843 Last data filed at 01/24/16 1800  Gross per 24 hour  Intake          5360.29 ml  Output             1025 ml  Net          4335.29 ml   Blood pressure (!) 71/56, pulse (!) 109, temperature 98.1 F (36.7 C), temperature source Oral, resp. rate (!) 28, weight 179 lb 7.3 oz (81.4 kg), SpO2 97 %. Temp:  [97.5 F (36.4 C)-100.2 F (37.9 C)] 98.1 F (36.7 C) (08/14 1533) Pulse Rate:  [58-126] 109 (08/14 1800) Resp:  [14-28] 28 (08/14 1800) BP: (71-119)/(43-77) 71/56 (08/14 1800) SpO2:  [91 %-100 %]  97 % (08/14 1800) Arterial Line BP: (69-141)/(38-57) 99/42 (08/14 1800) FiO2 (%):  [40 %] 40 % (08/14 1800) Weight:  [179 lb 7.3 oz (81.4 kg)] 179 lb 7.3 oz (81.4 kg) (08/14 0433)  Physical Exam:  HEENT: anicteric sclera,   Cardiovascular: egular rate, normal r,  no murmur rubs or gallops Pulmonary: Coarse breath sounds Gastrointestinal:nontender Musculoskeletal: restraints Skin, ecchymoses  Neuro: nonfocal  CBC:    BMET  Recent Labs  01/23/16 1548 01/24/16 0505  NA 147* 146*  K 3.9 3.6  CL 113* 108  CO2 26 27  GLUCOSE 94 179*  BUN 64* 67*  CREATININE 2.92* 2.95*  CALCIUM 6.1* 6.2*     Liver Panel   Recent Labs  01/31/2016 1859  PROT 4.2*  ALBUMIN 1.3*  AST 148*  ALT 216*  ALKPHOS 661*  BILITOT 1.1       Sedimentation Rate No results for input(s): ESRSEDRATE in the last 72 hours. C-Reactive  Protein No results for input(s): CRP in the last 72 hours.  Micro Results: Recent Results (from the past 720 hour(s))  MRSA PCR Screening     Status: None   Collection Time: 01/03/16  5:54 PM  Result Value Ref Range Status   MRSA by PCR NEGATIVE NEGATIVE Final    Comment:        The GeneXpert MRSA Assay (FDA approved for NASAL specimens only), is one component of a comprehensive MRSA colonization surveillance program. It is not intended to diagnose MRSA infection nor to guide or monitor treatment for MRSA infections.   Blood Culture (routine x 2)     Status: None   Collection Time: 01/03/16  6:44 PM  Result Value Ref Range Status   Specimen Description BLOOD LEFT ANTECUBITAL  Final   Special Requests BOTTLES DRAWN AEROBIC AND ANAEROBIC 5CC  Final   Culture   Final    NO GROWTH 5 DAYS Performed at [facility]    Report Status 01/08/2016 FINAL  Final  Blood Culture (routine x 2)     Status: Abnormal   Collection Time: 01/03/16  6:50 PM  Result Value Ref Range Status   Specimen Description BLOOD LEFT HAND  Final   Special Requests BOTTLES DRAWN AEROBIC AND ANAEROBIC 5CC  Final   Culture  Setup Time   Final    GRAM POSITIVE COCCI IN CLUSTERS AEROBIC BOTTLE ONLY CRITICAL RESULT CALLED TO, READ BACK BY AND VERIFIED WITH: E. JACKSON, [facility] PHARMD AT [place] ON 01/04/16 BY C. JESSUP, MLT.    Culture (A)  Final    STAPHYLOCOCCUS SPECIES (COAGULASE NEGATIVE) THE SIGNIFICANCE OF ISOLATING THIS ORGANISM FROM A SINGLE SET OF BLOOD CULTURES WHEN MULTIPLE SETS ARE DRAWN IS UNCERTAIN. PLEASE NOTIFY THE MICROBIOLOGY DEPARTMENT WITHIN ONE WEEK IF SPECIATION AND SENSITIVITIES ARE REQUIRED. Performed at [facility]    Report Status 01/05/2016 FINAL  Final  Blood Culture ID Panel (Reflexed)     Status: Abnormal   Collection Time: 01/03/16  6:50 PM  Result Value Ref Range Status   Enterococcus species NOT DETECTED NOT DETECTED Final   Vancomycin resistance NOT DETECTED NOT  DETECTED Final   Listeria monocytogenes NOT DETECTED NOT DETECTED Final   Staphylococcus species DETECTED (A) NOT DETECTED Final    Comment: CRITICAL RESULT CALLED TO, READ BACK BY AND VERIFIED WITH: [place], [facility] PHARMD AT 1840 ON 01/04/16 BY C. JESSUP, MLT.    Staphylococcus aureus NOT DETECTED NOT DETECTED Final   Methicillin resistance NOT DETECTED NOT DETECTED Final  Streptococcus species NOT DETECTED NOT DETECTED Final   Streptococcus agalactiae NOT DETECTED NOT DETECTED Final   Streptococcus pneumoniae NOT DETECTED NOT DETECTED Final   Streptococcus pyogenes NOT DETECTED NOT DETECTED Final   Acinetobacter baumannii NOT DETECTED NOT DETECTED Final   Enterobacteriaceae species NOT DETECTED NOT DETECTED Final   Enterobacter cloacae complex NOT DETECTED NOT DETECTED Final   Escherichia coli NOT DETECTED NOT DETECTED Final   Klebsiella oxytoca NOT DETECTED NOT DETECTED Final   Klebsiella pneumoniae NOT DETECTED NOT DETECTED Final   Proteus species NOT DETECTED NOT DETECTED Final   Serratia marcescens NOT DETECTED NOT DETECTED Final   Carbapenem resistance NOT DETECTED NOT DETECTED Final   Haemophilus influenzae NOT DETECTED NOT DETECTED Final   Neisseria meningitidis NOT DETECTED NOT DETECTED Final   Pseudomonas aeruginosa NOT DETECTED NOT DETECTED Final   Candida albicans NOT DETECTED NOT DETECTED Final   Candida glabrata NOT DETECTED NOT DETECTED Final   Candida krusei NOT DETECTED NOT DETECTED Final   Candida parapsilosis NOT DETECTED NOT DETECTED Final   Candida tropicalis NOT DETECTED NOT DETECTED Final    Comment: Performed at [facility]  Culture, respiratory (NON-Expectorated)     Status: None   Collection Time: 01/03/16  7:52 PM  Result Value Ref Range Status   Specimen Description THROAT  Final   Special Requests NONE  Final   Gram Stain   Final    ABUNDANT WBC PRESENT,BOTH PMN AND MONONUCLEAR RARE GRAM POSITIVE COCCI IN PAIRS RARE GRAM VARIABLE ROD RARE  YEAST    Culture   Final    Consistent with normal respiratory flora. Performed at [facility]    Report Status 01/06/2016 FINAL  Final  Culture, blood (routine x 2)     Status: None   Collection Time: 01/03/16  8:45 PM  Result Value Ref Range Status   Specimen Description BLOOD RIGHT ANTECUBITAL  Final   Special Requests BOTTLES DRAWN AEROBIC AND ANAEROBIC 5CC  Final   Culture   Final    NO GROWTH 5 DAYS Performed at [facility]    Report Status 01/08/2016 FINAL  Final  Culture, blood (routine x 2)     Status: None   Collection Time: 01/03/16  8:49 PM  Result Value Ref Range Status   Specimen Description BLOOD RIGHT HAND  Final   Special Requests BOTTLES DRAWN AEROBIC ONLY 5CC  Final   Culture   Final    NO GROWTH 5 DAYS Performed at [facility]    Report Status 01/08/2016 FINAL  Final  Urine culture     Status: None   Collection Time: 01/03/16 11:15 PM  Result Value Ref Range Status   Specimen Description URINE, CATHETERIZED  Final   Special Requests NONE  Final   Culture NO GROWTH Performed at [facility]   Final   Report Status 01/05/2016 FINAL  Final  Respiratory Panel by PCR     Status: None   Collection Time: 01/03/16 11:20 PM  Result Value Ref Range Status   Adenovirus NOT DETECTED NOT DETECTED Final   Coronavirus 229E NOT DETECTED NOT DETECTED Final   Coronavirus HKU1 NOT DETECTED NOT DETECTED Final   Coronavirus NL63 NOT DETECTED NOT DETECTED Final   Coronavirus OC43 NOT DETECTED NOT DETECTED Final   Metapneumovirus NOT DETECTED NOT DETECTED Final   Rhinovirus / Enterovirus NOT DETECTED NOT DETECTED Final   Influenza A NOT DETECTED NOT DETECTED Final   Influenza A H1 NOT DETECTED  NOT DETECTED Final   Influenza A H1 2009 NOT DETECTED NOT DETECTED Final   Influenza A H3 NOT DETECTED NOT DETECTED Final   Influenza B NOT DETECTED NOT DETECTED Final   Parainfluenza Virus 1 NOT DETECTED NOT DETECTED Final   Parainfluenza Virus 2  NOT DETECTED NOT DETECTED Final   Parainfluenza Virus 3 NOT DETECTED NOT DETECTED Final   Parainfluenza Virus 4 NOT DETECTED NOT DETECTED Final   Respiratory Syncytial Virus NOT DETECTED NOT DETECTED Final   Bordetella pertussis NOT DETECTED NOT DETECTED Final   Chlamydophila pneumoniae NOT DETECTED NOT DETECTED Final   Mycoplasma pneumoniae NOT DETECTED NOT DETECTED Final    Comment: Performed at [facility]  Culture, respiratory (NON-Expectorated)     Status: None   Collection Time: 01/06/16  9:45 AM  Result Value Ref Range Status   Specimen Description TRACHEAL ASPIRATE  Final   Special Requests Normal  Final   Gram Stain   Final    NO WBC SEEN RARE GRAM POSITIVE COCCI IN PAIRS AND CHAINS RARE YEAST RARE SQUAMOUS EPITHELIAL CELLS PRESENT    Culture   Final    Consistent with normal respiratory flora. Performed at [facility]    Report Status 01/08/2016 FINAL  Final  Pneumocystis smear by DFA     Status: None   Collection Time: 01/06/16 10:04 AM  Result Value Ref Range Status   Specimen Source-PJSRC TRACHEAL ASPIRATE  Final   Pneumocystis jiroveci Ag NEGATIVE  Final    Comment: Performed at [facility]  Culture, blood (Routine X 2) w Reflex to ID Panel     Status: None   Collection Time: 01/06/16 11:37 AM  Result Value Ref Range Status   Specimen Description BLOOD RIGHT HAND  Final   Special Requests BOTTLES DRAWN AEROBIC AND ANAEROBIC  5CC  Final   Culture   Final    NO GROWTH 5 DAYS Performed at [facility]    Report Status 01/11/2016 FINAL  Final  Culture, blood (Routine X 2) w Reflex to ID Panel     Status: None   Collection Time: 01/06/16 11:44 AM  Result Value Ref Range Status   Specimen Description BLOOD LEFT HAND  Final   Special Requests BOTTLES DRAWN AEROBIC ONLY [facility]  Final   Culture   Final    NO GROWTH 5 DAYS Performed at [facility]    Report Status 01/11/2016 FINAL  Final  Virus culture     Status: None     Collection Time: 01/11/16  5:48 PM  Result Value Ref Range Status   Viral Culture No virus isolated.  Corrected    Comment: (NOTE) Performed At: [facility] [place], [place] [id number] Lindon Romp MD [id number] CORRECTED ON 08/10 AT 1035: PREVIOUSLY REPORTED AS Comment    Source of Sample EAR  Final    Comment: RIGHT  Culture, blood (routine x 2)     Status: None   Collection Time: 01/15/16  6:46 PM  Result Value Ref Range Status   Specimen Description BLOOD LEFT HAND  Final   Special Requests BOTTLES DRAWN AEROBIC AND ANAEROBIC 5CC  Final   Culture   Final    NO GROWTH 5 DAYS Performed at [facility]    Report Status 01/20/2016 FINAL  Final  Culture, blood (routine x 2)     Status: None   Collection Time: 01/15/16  6:53 PM  Result Value Ref  Range Status   Specimen Description BLOOD LEFT ARM  Final   Special Requests BOTTLES DRAWN AEROBIC AND ANAEROBIC 10CC  Final   Culture   Final    NO GROWTH 5 DAYS Performed at [facility]    Report Status 02/04/2016 FINAL  Final  Culture, respiratory (NON-Expectorated)     Status: None   Collection Time: 01/19/16 11:21 AM  Result Value Ref Range Status   Specimen Description TRACHEAL ASPIRATE  Final   Special Requests NONE  Final   Gram Stain   Final    ABUNDANT WBC PRESENT,BOTH PMN AND MONONUCLEAR MODERATE SQUAMOUS EPITHELIAL CELLS PRESENT MODERATE GRAM NEGATIVE RODS MODERATE GRAM POSITIVE COCCI IN PAIRS IN CLUSTERS RARE YEAST RARE GRAM POSITIVE RODS Performed at [place]  Final   Report Status 01/22/2016 FINAL  Final   Organism ID, Bacteria STENOTROPHOMONAS MALTOPHILIA  Final      Susceptibility   Stenotrophomonas maltophilia - MIC*    LEVOFLOXACIN 2 SENSITIVE Sensitive     TRIMETH/SULFA <=20 SENSITIVE Sensitive     * MODERATE STENOTROPHOMONAS MALTOPHILIA  C difficile quick scan w PCR reflex     Status: None    Collection Time: 01/19/16  8:50 PM  Result Value Ref Range Status   C Diff antigen NEGATIVE NEGATIVE Final   C Diff toxin NEGATIVE NEGATIVE Final   C Diff interpretation No C. difficile detected.  Final  Culture, blood (routine x 2)     Status: Abnormal   Collection Time: 01/12/2016 10:00 AM  Result Value Ref Range Status   Specimen Description BLOOD RIGHT ANTECUBITAL  Final   Special Requests IN PEDIATRIC BOTTLE 2CC  Final   Culture  Setup Time   Final    GRAM POSITIVE COCCI IN CLUSTERS AEROBIC BOTTLE ONLY Organism ID to follow CRITICAL RESULT CALLED TO, READ BACK BY AND VERIFIED WITHAlona Bene PHARMD [id number] 01/22/16 A BROWNING    Culture METHICILLIN RESISTANT STAPHYLOCOCCUS AUREUS (A)  Final   Report Status 01/24/2016 FINAL  Final   Organism ID, Bacteria METHICILLIN RESISTANT STAPHYLOCOCCUS AUREUS  Final      Susceptibility   Methicillin resistant staphylococcus aureus - MIC*    CIPROFLOXACIN >=8 RESISTANT Resistant     ERYTHROMYCIN >=8 RESISTANT Resistant     GENTAMICIN <=0.5 SENSITIVE Sensitive     OXACILLIN >=4 RESISTANT Resistant     TETRACYCLINE <=1 SENSITIVE Sensitive     VANCOMYCIN <=0.5 SENSITIVE Sensitive     TRIMETH/SULFA <=10 SENSITIVE Sensitive     CLINDAMYCIN <=0.25 SENSITIVE Sensitive     RIFAMPIN <=0.5 SENSITIVE Sensitive     Inducible Clindamycin NEGATIVE Sensitive     * METHICILLIN RESISTANT STAPHYLOCOCCUS AUREUS  Blood Culture ID Panel (Reflexed)     Status: Abnormal   Collection Time: 01/20/2016 10:00 AM  Result Value Ref Range Status   Enterococcus species NOT DETECTED NOT DETECTED Final   Vancomycin resistance NOT DETECTED NOT DETECTED Final   Listeria monocytogenes NOT DETECTED NOT DETECTED Final   Staphylococcus species DETECTED (A) NOT DETECTED Final    Comment: CRITICAL RESULT CALLED TO, READ BACK BY AND VERIFIED WITH: V BRYK PHARMD 0654 01/22/16 A BROWNING    Staphylococcus aureus DETECTED (A) NOT DETECTED Final    Comment: CRITICAL RESULT CALLED TO, READ  BACK BY AND VERIFIED WITH: V BRYK PHARMD 0654 01/22/16 A BROWNING    Methicillin resistance DETECTED (A) NOT DETECTED Final    Comment: CRITICAL RESULT CALLED TO, READ BACK  BY AND VERIFIED WITH: Alona Bene PHARMD [id number] 01/22/16 A BROWNING    Streptococcus species NOT DETECTED NOT DETECTED Final   Streptococcus agalactiae NOT DETECTED NOT DETECTED Final   Streptococcus pneumoniae NOT DETECTED NOT DETECTED Final   Streptococcus pyogenes NOT DETECTED NOT DETECTED Final   Acinetobacter baumannii NOT DETECTED NOT DETECTED Final   Enterobacteriaceae species NOT DETECTED NOT DETECTED Final   Enterobacter cloacae complex NOT DETECTED NOT DETECTED Final   Escherichia coli NOT DETECTED NOT DETECTED Final   Klebsiella oxytoca NOT DETECTED NOT DETECTED Final   Klebsiella pneumoniae NOT DETECTED NOT DETECTED Final   Proteus species NOT DETECTED NOT DETECTED Final   Serratia marcescens NOT DETECTED NOT DETECTED Final   Carbapenem resistance NOT DETECTED NOT DETECTED Final   Haemophilus influenzae NOT DETECTED NOT DETECTED Final   Neisseria meningitidis NOT DETECTED NOT DETECTED Final   Pseudomonas aeruginosa NOT DETECTED NOT DETECTED Final   Candida albicans NOT DETECTED NOT DETECTED Final   Candida glabrata NOT DETECTED NOT DETECTED Final   Candida krusei NOT DETECTED NOT DETECTED Final   Candida parapsilosis NOT DETECTED NOT DETECTED Final   Candida tropicalis NOT DETECTED NOT DETECTED Final  Culture, blood (routine x 2)     Status: None (Preliminary result)   Collection Time: 01/29/2016 10:10 AM  Result Value Ref Range Status   Specimen Description BLOOD RIGHT ANTECUBITAL  Final   Special Requests IN PEDIATRIC BOTTLE 1CC  Final   Culture NO GROWTH 3 DAYS  Final   Report Status PENDING  Incomplete  Culture, bal-quantitative     Status: None   Collection Time: 01/29/2016 12:11 PM  Result Value Ref Range Status   Specimen Description BRONCHIAL ALVEOLAR LAVAGE  Final   Special Requests IMMUNOSUPPRESSED   Final   Gram Stain   Final    ABUNDANT WBC PRESENT, PREDOMINANTLY PMN ABUNDANT GRAM POSITIVE COCCI IN CLUSTERS IN CHAINS FEW GRAM NEGATIVE RODS    Culture MULTIPLE ORGANISMS PRESENT, NONE PREDOMINANT  Final   Report Status 01/23/2016 FINAL  Final  Stat Gram stain     Status: None   Collection Time: 01/16/2016  1:11 PM  Result Value Ref Range Status   Specimen Description BRONCHIAL ALVEOLAR LAVAGE  Final   Special Requests NONE  Final   Gram Stain   Final    ABUNDANT WBC PRESENT, PREDOMINANTLY PMN ABUNDANT GRAM POSITIVE COCCI IN CLUSTERS IN CHAINS FEW GRAM NEGATIVE RODS    Report Status 01/29/2016 FINAL  Final  Urine culture     Status: None   Collection Time: 01/11/2016  2:34 PM  Result Value Ref Range Status   Specimen Description URINE, CATHETERIZED  Final   Special Requests NONE  Final   Culture NO GROWTH  Final   Report Status 01/22/2016 FINAL  Final  Culture, blood (routine x 2)     Status: Abnormal   Collection Time: 02/05/2016  2:45 PM  Result Value Ref Range Status   Specimen Description BLOOD RIGHT HAND  Final   Special Requests IN PEDIATRIC BOTTLE 1CC  Final   Culture  Setup Time   Final    GRAM POSITIVE COCCI IN CLUSTERS AEROBIC BOTTLE ONLY CRITICAL VALUE NOTED.  VALUE IS CONSISTENT WITH PREVIOUSLY REPORTED AND CALLED VALUE.    Culture (A)  Final    STAPHYLOCOCCUS AUREUS SUSCEPTIBILITIES PERFORMED ON PREVIOUS CULTURE WITHIN THE LAST 5 DAYS.    Report Status 01/24/2016 FINAL  Final  Culture, blood (routine x 2)     Status: Abnormal  Collection Time: 01/13/2016  2:52 PM  Result Value Ref Range Status   Specimen Description BLOOD RIGHT ANTECUBITAL  Final   Special Requests IN PEDIATRIC BOTTLE 2CC  Final   Culture  Setup Time   Final    GRAM POSITIVE COCCI IN CLUSTERS AEROBIC BOTTLE ONLY CRITICAL RESULT CALLED TO, READ BACK BY AND VERIFIED WITH: C BALL,PHARMD AT 1001 01/22/16 BY L BENFIELD    Culture (A)  Final    STAPHYLOCOCCUS AUREUS SUSCEPTIBILITIES PERFORMED ON  PREVIOUS CULTURE WITHIN THE LAST 5 DAYS.    Report Status 01/24/2016 FINAL  Final  Culture, blood (Routine X 2) w Reflex to ID Panel     Status: None (Preliminary result)   Collection Time: 01/23/16 11:45 AM  Result Value Ref Range Status   Specimen Description BLOOD BLOOD RIGHT HAND  Final   Special Requests IN PEDIATRIC BOTTLE .5CC  Final   Culture  Setup Time   Final    GRAM POSITIVE COCCI IN CLUSTERS IN PEDIATRIC BOTTLE CRITICAL RESULT CALLED TO, READ BACK BY AND VERIFIED WITH: VPamalee Leyden, PHARM AT [place] [id number] BY S. YARBROUGH    Culture NO GROWTH 1 DAY  Final   Report Status PENDING  Incomplete  Culture, blood (Routine X 2) w Reflex to ID Panel     Status: None (Preliminary result)   Collection Time: 01/23/16 12:10 PM  Result Value Ref Range Status   Specimen Description BLOOD RIGHT ANTECUBITAL  Final   Special Requests IN PEDIATRIC BOTTLE 2.5CC  Final   Culture  Setup Time   Final    GRAM POSITIVE COCCI IN CLUSTERS IN PEDIATRIC BOTTLE CRITICAL RESULT CALLED TO, READ BACK BY AND VERIFIED WITH: M BITONTI,PHARMD AT 1018 01/24/16 BY L BENFIELD    Culture GRAM POSITIVE COCCI  Final   Report Status PENDING  Incomplete    Studies/Results: Dg Chest Port 1 View  Result Date: 01/24/2016 CLINICAL DATA:  Pneumothorax. EXAM: PORTABLE CHEST 1 VIEW COMPARISON:  01/23/2016.  01/19/2016. FINDINGS: Tracheostomy tube, NG tube, left chest tube in stable position.Heart size stable. Low lung volumes with progression of bibasilar atelectasis and/or infiltrates. Small bilateral pleural effusions cannot be excluded. No pneumothorax. Mild left chest wall subcutaneous emphysema. IMPRESSION: 1. Tracheostomy tube, NG tube, left chest tube in stable position. No pneumothorax. 2. Low lung volumes with progressive bibasilar atelectasis and/or infiltrates. Bibasilar pneumonia cannot be excluded. Small bilateral pleural effusions cannot be excluded. Electronically Signed   By: Marcello Moores  Register   On: 01/24/2016  06:39   Dg Chest Port 1 View  Result Date: 01/23/2016 CLINICAL DATA:  Chest tube EXAM: PORTABLE CHEST 1 VIEW COMPARISON:  01/23/2016 FINDINGS: Tracheostomy remains in good position. NG tube enters the stomach with the tip not visualized. Large left pneumothorax unchanged from earlier today. Interval placement of small bore chest tube catheter anteriorly on the left without significant change in pneumothorax. Mild shift of the right suggesting tension. Left lower lobe collapse unchanged. Right lower lobe atelectasis unchanged. IMPRESSION: Small bore chest tube on the left without significant change in large left tension pneumothorax. Electronically Signed   By: Franchot Gallo M.D.   On: 01/23/2016 07:53   Dg Chest Port 1 View  Result Date: 01/23/2016 CLINICAL DATA:  Left chest tube EXAM: PORTABLE CHEST 1 VIEW COMPARISON:  Chest radiograph from earlier today. FINDINGS: Left apical chest tube is in place. Tracheostomy tube tip overlies the tracheal air column at the thoracic inlet. Enteric tube enters stomach with the tip not seen on  this image. Stable cardiomediastinal silhouette with top-normal heart size. No appreciable residual pneumothorax. No residual mediastinal shift. No pleural effusion. Patchy opacities throughout both lungs, most prominent at the lung bases, not definitely changed. Extensive subcutaneous emphysema in the left chest wall appears increased. Partially visualized bilateral spinal fusion hardware overlies the lower thoracic spine. IMPRESSION: 1. No residual left pneumothorax status post left chest tube placement. No residual mediastinal shift. Prominent subcutaneous emphysema in the left chest wall. 2. Patchy opacities throughout both lungs, not appreciably changed, most prominent at the lung bases, suspicious for multifocal pneumonia. Electronically Signed   By: Ilona Sorrel M.D.   On: 01/23/2016 07:32   Dg Chest Port 1 View  Result Date: 01/23/2016 CLINICAL DATA:  Acute onset of  shortness of breath. Initial encounter. EXAM: PORTABLE CHEST 1 VIEW COMPARISON:  Chest radiograph performed 01/13/2016 FINDINGS: The patient's tracheostomy tube is seen ending 7 cm above the carina. There is a very large left-sided pneumothorax, with mild rightward mediastinal shift, concerning for some degree of tension. Right-sided atelectasis and vascular congestion are seen. Mild underlying airspace opacity may reflect interstitial edema or pneumonia. No pleural effusion is identified. The cardiomediastinal silhouette remains normal in size. Thoracolumbar spinal fusion hardware is partially imaged. No acute osseous abnormalities are seen. An enteric tube is noted extending below the diaphragm. IMPRESSION: 1. Tracheostomy tube seen ending 7 cm above the carina. 2. New very large left-sided pneumothorax, with mild rightward mediastinal shift, concerning for some degree of tension. 3. Right-sided atelectasis and vascular congestion seen. Mild underlying airspace opacity may reflect interstitial edema or pneumonia. Critical Value/emergent results were called by telephone at the time of interpretation on 01/23/2016 at 5:26 am to Dr. Tamala Julian, who verbally acknowledged these results. Electronically Signed   By: Garald Balding M.D.   On: 01/23/2016 05:41   Dg Shoulder Left Port  Result Date: 01/23/2016 CLINICAL DATA:  Shoulder pain with movement. EXAM: LEFT SHOULDER - 1 VIEW COMPARISON:  Chest radiography same day. FINDINGS: Extensive overlying artifact. No evidence of fracture or dislocation. Probable previous surgery in the region the distal clavicle and acromion. Narrowed humeral acromial distance probably indicates chronic rotator cuff disease. IMPRESSION: Probable previous acromioplasty. 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 No acute finding. Electronically Signed   By: Nelson Chimes M.D.   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 male with   75 y.o. 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 but to try to cover this organism that may be only a colonizer and is of dubious clinical signficance here poses risk  FQ --> QT prolongation and the  QT is already prolonged  Bactrim --> worsening renal failure and his renal function is deteriorating already and I do not want to ppt need for HD  Ideally he should have his central line removed along with his arterial ilne (at least they are newer ones placed day of + blood cultures, undoubtedly there seeded but at least they are newer than the ones he had prior to crashing)  His MRSA is not clearing undoubtedly because we cannot give him a "central line holiday."   #2                                                                          [facility] Staphylococcus aureus bacteremia  Staphylococcus aureus bacteremia (SAB) is associated with a high rate of complications and mortality.  Specific aspects of clinical management are critical to optimizing the outcome of patients with SAB.  Therefore, the [facility] Antimicrobial Management Team [facility]) has initiated an  intervention aimed at improving the management of SAB at [facility].  To do so, Infectious Diseases physicians are providing an evidence-based consult for the management of all patients with SAB.     Yes No Comments  Perform follow-up blood cultures (even if the patient is afebrile) to ensure clearance of bacteremia [x] [] Persistently POSITIVE blood cultues. He is NOT going to clear this infection without a line "holiday" but currently still  On very high dose pressors  Remove vascular catheter and obtain follow-up blood cultures after the removal of the catheter [x] [] SEE ABOVE DISCUSSION in ideal world he needs a "line holiday"  Perform echocardiography to evaluate for endocarditis (transthoracic ECHO is 40-50% sensitive, TEE is > 90% sensitive) [] [] Please keep in mind, that neither test can definitively EXCLUDE endocarditis, and that should clinical suspicion remain high for endocarditis the patient should then still be treated with an "endocarditis" duration of therapy = 6 weeks  TTE ordered  Consult electrophysiologist to evaluate implanted cardiac device (pacemaker, ICD) [] []   Ensure source control [] [] Have all abscesses been drained effectively? Have deep seeded infections (septic joints or osteomyelitis) had appropriate surgical debridement?  Investigate for "metastatic" sites of infection [] [] Does the patient have ANY symptom or physical exam finding that would suggest a deeper infection (back or neck pain that may be suggestive of vertebral osteomyelitis or epidural abscess, muscle pain that could be a symptom of pyomyositis)?  Keep in mind that for deep seeded infections MRI imaging with contrast is preferred rather than other often insensitive tests such as plain x-rays, especially early in a patient's presentation. Hard to do a this point  Change antibiotic therapy to teflaro [] [] Beta-lactam antibiotics are preferred for MSSA due to higher cure rates.   If on Vancomycin,  goal trough should be 15 - 20 mcg/mL  Estimated duration of IV antibiotic therapy:    6 weeks IF he were to survie [] [] Consult case management for probably prolonged outpatient IV antibiotic therapy    #3 Prognosis is very poor: I agree with change to pure palliative approach and anticipate this will occur in the am.        LOS: 3 days  Alcide Evener 01/24/2016, 6:43 PM

## 2016-01-24 NOTE — Progress Notes (Signed)
[place] Progress Note Patient Name: Curtis Frazier DOB: 08-01-1940 MRN: [id number]   Date of Service  01/24/2016  HPI/Events of Note  Patient with continued hyperglycemia requiring insulin infusion. Patient is likely withdrawal of care tomorrow morning.   eICU Interventions  1. Transitioned to NPH 7 units subcutaneous every 12 hours starting at 8 p.m. 2. Starting sliding scale insulin as per moderate algorithm with Accu-Cheks every 4 hours starting at 8 PM  3. Plan to discontinue every hour Accu-Cheks & insulin drip at 8:30 PM      Intervention Category Intermediate Interventions: Hyperglycemia - evaluation and treatment  Tera Partridge 01/24/2016, 7:27 PM

## 2016-01-24 NOTE — Progress Notes (Signed)
Nutrition Follow-up  DOCUMENTATION CODES:   Severe malnutrition in context of acute illness/injury Pt meets criteria for SEVERE MALNUTRITION in the context of Acute Illness as evidenced by an estimated intake that has met < or equal to 50% of needs for > or equal to 5 days and mod fat/muscle wasting, mod-severe fluid accumulation.   INTERVENTION:  If warranted re-start:  Nepro with carb steady @ 35 ml/hr  60 ml Prostat BID Provides: 1912 kcal, 128 grams protein, and 611 ml H2O.   NUTRITION DIAGNOSIS:   Inadequate oral intake related to inability to eat as evidenced by NPO status. Ongoing.   GOAL:   Patient will meet greater than or equal to 90% of their needs Met.   MONITOR:   Diet advancement, Vent status, Labs, I & O's, TF tolerance  REASON FOR ASSESSMENT:   Consult Enteral/tube feeding initiation and management  ASSESSMENT:   75 y/o male PMHx CKD III, CHF, CAD, COPD, AFIB, HLD. Pt has had multiple admission since mid July. Most recently a complicated admission w/ ARDS, shOCK,  at [facility] from 7/24-8/9. Briefly transferred to [facility], but worsened and was readmitted on 8/11 for suspected HCAP  8/3 trach (during last admission) Constipated during last admission Spoke with RN at bedside. TF held due to residuals of >1000 ml. MD having family meeting today for GOC.  Discussed pt's hx of constipation and that he has no scheduled meds to help with this and no BM since re-admission.   MV: 14 L/min Temp (24hrs), Avg:99.1 F (37.3 C), Min:97.5 F (36.4 C), Max:100.2 F (37.9 C)  Medications reviewed and include: solu-cortef Labs reviewed: Na 146, PO4 5.4, Magnesium 1.5 CBG's: 132-154 Free water: 200 ml every 8 hours = 600 ml  NG enters stomach, tip not visualized per xray  Diet Order:  Diet NPO time specified  Skin:  Wound (see comment) (deep tissue injury/unstag sacrum,weeping BUE, stage I ischic)   Last BM:  8/9  Height:   Ht Readings from Last 1 Encounters:  01/05/16  5' 8" (1.727 m)    Weight:   Wt Readings from Last 1 Encounters:  01/24/16 179 lb 7.3 oz (81.4 kg)    Ideal Body Weight:  70 kg  BMI:  Body mass index is 27.29 kg/m.  Estimated Nutritional Needs:   Kcal:  1917 kcals  Protein:  112-132 g (1.7-2 g/kg bw)  Fluid:  PER MD  EDUCATION NEEDS:   No education needs identified at this time    RD, LDN, CNSC [phone] Pager [phone] After Hours Pager  

## 2016-01-24 NOTE — Progress Notes (Signed)
Pt having gurgling noises coming from stomach. Checked tube feeding residual and 1050 ml out. CCM called to make aware.

## 2016-01-24 NOTE — Care Management Note (Signed)
Case Management Note  Patient Details  Name: Curtis Frazier MRN: [id number] Date of Birth: 09-03-1940  Subjective/Objective:        75 yo male with recent admit and dc from [facility] with baseline RA , steroid dependent, developed ARDS and required tracheostomy. Renal function was stage 3 but he did come off CVVH. Transferred to [facility] 8/9 and PCCM consulted on same day. He spent 12 hours on trach collar 8/10 but on 8/11 he was back on full vent support with rr 37 , severe air hunger and anxiety. FOB per Dr. Rosina Lowenstein with rt lower lobe with copious thick white secretions. Due ti his decline aand family wishes he will be transferred to [facility] and to ICU and treated for suspected HCAP.  Action/Plan: Pt discharged to [facility] 01/19/16.  Prior to that pt noted to be   active with [facility] services.  CM will continue to follow for discharge needs   Expected Discharge Date:                  Expected Discharge Plan:  Long Term Acute Care (LTAC)  In-House Referral:     Discharge planning Services  CM Consult  Post Acute Care Choice:    Choice offered to:     DME Arranged:    DME Agency:     HH Arranged:    HH Agency:     Status of Service:  In process, will continue to follow  If discussed at Long Length of Stay Meetings, dates discussed:    Additional Comments:  Maryclare Labrador, RN 01/24/2016, 2:07 PM Case Management Note  Patient Details  Name: Curtis Frazier MRN: [id number] Date of Birth: 1940/07/08  Subjective/Objective:                    Action/Plan:   Expected Discharge Date:                  Expected Discharge Plan:  Long Term Acute Care (LTAC)  In-House Referral:     Discharge planning Services  CM Consult  Post Acute Care Choice:    Choice offered to:     DME Arranged:    DME Agency:     HH Arranged:    HH Agency:     Status of Service:  In process, will continue to follow  If discussed at Long Length of Stay Meetings, dates discussed:    Additional  Comments:  Maryclare Labrador, RN 01/24/2016, 2:07 PM

## 2016-01-24 NOTE — Progress Notes (Signed)
PHARMACY - PHYSICIAN COMMUNICATION CRITICAL VALUE ALERT - BLOOD CULTURE IDENTIFICATION (BCID)  Results for orders placed or performed during the hospital encounter of 01/19/16  Blood Culture ID Panel (Reflexed) (Collected: 01/29/2016 10:00 AM)  Result Value Ref Range   Enterococcus species NOT DETECTED NOT DETECTED   Vancomycin resistance NOT DETECTED NOT DETECTED   Listeria monocytogenes NOT DETECTED NOT DETECTED   Staphylococcus species DETECTED (A) NOT DETECTED   Staphylococcus aureus DETECTED (A) NOT DETECTED   Methicillin resistance DETECTED (A) NOT DETECTED   Streptococcus species NOT DETECTED NOT DETECTED   Streptococcus agalactiae NOT DETECTED NOT DETECTED   Streptococcus pneumoniae NOT DETECTED NOT DETECTED   Streptococcus pyogenes NOT DETECTED NOT DETECTED   Acinetobacter baumannii NOT DETECTED NOT DETECTED   Enterobacteriaceae species NOT DETECTED NOT DETECTED   Enterobacter cloacae complex NOT DETECTED NOT DETECTED   Escherichia coli NOT DETECTED NOT DETECTED   Klebsiella oxytoca NOT DETECTED NOT DETECTED   Klebsiella pneumoniae NOT DETECTED NOT DETECTED   Proteus species NOT DETECTED NOT DETECTED   Serratia marcescens NOT DETECTED NOT DETECTED   Carbapenem resistance NOT DETECTED NOT DETECTED   Haemophilus influenzae NOT DETECTED NOT DETECTED   Neisseria meningitidis NOT DETECTED NOT DETECTED   Pseudomonas aeruginosa NOT DETECTED NOT DETECTED   Candida albicans NOT DETECTED NOT DETECTED   Candida glabrata NOT DETECTED NOT DETECTED   Candida krusei NOT DETECTED NOT DETECTED   Candida parapsilosis NOT DETECTED NOT DETECTED   Candida tropicalis NOT DETECTED NOT DETECTED   Second repeat BCx from 8/13 also positive for GPC in clusters. No BCID speciation at this time given prior BCx results showing MRSA. Pt currently being followed by ID and on ceftaroline and metronidazole. Unclear why BCx remain positive at this time, follow ID recs.   Arrie Senate, PharmD PGY-1  Pharmacy Resident Pager: [phone] 01/24/2016

## 2016-01-24 NOTE — Progress Notes (Signed)
Family meeting with the entire family today, after discussion, the family is clear they do not wish for further escalation of care.  Will likely withdraw in AM.  Rush Farmer, M.D. [facility] Pulmonary/Critical Care Medicine. Pager: [phone]. After hours pager: [phone].

## 2016-01-24 NOTE — Progress Notes (Signed)
Family meeting held today.   Discussed with Dr. Nelda Marseille.   No HD planned.  Planning for withdrawal of care tomorrow.   Nephrology to sign off.  Discussed with Dr. Justin Mend.  Jule Ser, DO 01/24/2016, 4:08 PM PGY-2, [facility]

## 2016-01-24 NOTE — Progress Notes (Signed)
PULMONARY / CRITICAL CARE MEDICINE   Name: Curtis Frazier MRN: [id number] DOB: 21-Feb-1941    ADMISSION DATE:  01/13/2016   REFERRING MD: [facility]  CHIEF COMPLAINT:  Acute on chronic resp failure  BRIEF 75 yo male with recent admit and dc from [facility] with baseline RA , steroid dependent, developed ARDS and required tracheostomy. Renal function was stage 3 but he did come off CVVH. Transferred to [facility] 8/9 and PCCM consulted on same day. He spent 12 hours on trach collar 8/10 but on 8/11 he was back on full vent support with rr 37 , severe air hunger and anxiety. FOB per Dr. Rosina Lowenstein with rt lower lobe with copious thick white secretions. Due ti his decline aand family wishes he will be transferred to [facility] and to ICU and treated for suspected HCAP.   CULTURES: 8/9 sptum GNR>> 8/11 bal>> 8/11 bc x 2>> GPC 8/11 uc>> 8/12 procal>>90  ANTIBIOTICS: 8/11 zyvox>> 8/11 meropenem >>  EVENTS: Trach 8/6>> 8/9./17 - dc to [facility] from [facility] 8/11 transferred from [facility] to [facility], aline placed 8/11 2 d>> 8/11 Abd US>> 01/18/2016 - femoral line and aline 01/22/16 - on 75mcg levophed. On vasopression. ID says he is bacteermie with possible MRSA. Ltd code. On IV heparin for A Fib. Severe metab acidosis despite bic gtt. Creat getting worse. shiock liver some btter. Daughter at bedside  SUBJECTIVE/OVERNIGHT/INTERVAL HX Remains critically ill.  VITAL SIGNS: BP (!) 91/50   Pulse (!) 112   Temp 97.5 F (36.4 C) (Oral)   Resp (!) 28   Wt 81.4 kg (179 lb 7.3 oz)   SpO2 100%   BMI 27.29 kg/m   HEMODYNAMICS:    VENTILATOR SETTINGS: Vent Mode: PRVC FiO2 (%):  [40 %-60 %] 40 % Set Rate:  [24 bmp-28 bmp] 28 bmp Vt Set:  [500 mL] 500 mL PEEP:  [8 cmH20] 8 cmH20 Plateau Pressure:  [20 [id number] cmH20] 21 cmH20  INTAKE / OUTPUT: I/O last 3 completed shifts: In: 10796.1 [I.V.:8711.1; NG/GT:725; IV Piggyback:1360] Out: 1160 [Urine:960; Chest Tube:200]  PHYSICAL EXAMINATION:  General:   full vent  support Neuro: sedated , RASS -3 HEENT:  [place]/AT, #6 cuffed trach in place Cardiovascular:  RRR, no MRG Lungs:  Coarse bases, sync wuith vent, 40% fio2 Abdomen:  Soft, slight tenderness, non-distended Musculoskeletal:  No acute deformity  Skin:  Grossly intact  LABS: PULMONARY  Recent Labs Lab 01/22/16 2035 01/23/16 0211 01/23/16 0905 01/23/16 1450 01/24/16 0403  PHART 7.258* 7.186* 7.295* 7.335* 7.352  PCO2ART 34.2* 36.1 46.9* 46.9* 47.5*  PO2ART 65.9* 63.9* 120* 72.5* 68.3*  HCO3 14.8* 13.1* 22.1 24.3* 25.4*  TCO2 15.9 14.2 23.5 25.7 26.8  O2SAT 89.4 84.8 97.6 92.5 88.6   CBC  Recent Labs Lab 01/23/16 0959 01/23/16 1815 01/24/16 0505  HGB 7.2* 7.7* 7.7*  HCT 23.5* 24.0* 24.1*  WBC 11.9* 14.4* 14.9*  PLT 78* 94* 74*   COAGULATION  Recent Labs Lab 01/20/16 0540 01/14/2016 1447  INR 1.15 1.24   CARDIAC   Recent Labs Lab 02/10/2016 1447 01/17/2016 2033 01/22/16 0415  TROPONINI 0.11* 0.08* 0.06*   No results for input(s): PROBNP in the last 168 hours.  CHEMISTRY  Recent Labs Lab 01/31/2016 1859 01/22/16 0201 01/23/16 0458 01/23/16 1222 01/23/16 1548 01/23/16 1700 01/24/16 0505  NA 145 146* 147*  --  147*  --  146*  K 4.5 5.1 4.3  --  3.9  --  3.6  CL 120* 115* 112*  --  113*  --  108  CO2 21* 19* 18*  --  26  --  27  GLUCOSE 320* 413* 340*  --  94  --  179*  BUN 47* 54* 64*  --  64*  --  67*  CREATININE 1.86* 2.35* 2.99*  --  2.92*  --  2.95*  CALCIUM 7.2* 7.1* 6.1*  --  6.1*  --  6.2*  MG  --  1.7 1.5* 1.6*  --  1.5* 1.5*  PHOS  --  7.2* 6.2* 5.4*  --  5.2* 5.4*   Estimated Creatinine Clearance: 20.6 mL/min (by C-G formula based on SCr of 2.95 mg/dL).  LIVER  Recent Labs Lab 01/19/16 0345 01/20/16 0540 01/20/2016 0550 01/26/2016 1447 01/13/2016 1859  AST  --  256* 215* 156* 148*  ALT  --  229* 255* 230* 216*  ALKPHOS  --  685* 745* 688* 661*  BILITOT  --  1.5* 0.9 1.1 1.1  PROT  --  5.2* 4.6* 4.4* 4.2*  ALBUMIN 1.8* 1.9* 1.5*  1.5* 1.4*  1.3*  INR  --  1.15  --  1.24  --    INFECTIOUS  Recent Labs Lab 02/10/2016 1859  01/22/16 0201 01/23/16 0000 01/23/16 0458 01/23/16 1544  LATICACIDVEN  --   < >  --  8.7* 7.0* 3.0*  PROCALCITON 28.30  --  90.54  --  79.69  --   < > = values in this interval not displayed.  ENDOCRINE CBG (last 3)   Recent Labs  01/23/16 2220 01/23/16 2334 01/24/16 0855  GLUCAP 151* 178* 154*   IMAGING x48h  - image(s) personally visualized  -   highlighted in bold Dg Chest Port 1 View  Result Date: 01/24/2016 CLINICAL DATA:  Pneumothorax. EXAM: PORTABLE CHEST 1 VIEW COMPARISON:  01/23/2016.  01/19/2016. FINDINGS: Tracheostomy tube, NG tube, left chest tube in stable position.Heart size stable. Low lung volumes with progression of bibasilar atelectasis and/or infiltrates. Small bilateral pleural effusions cannot be excluded. No pneumothorax. Mild left chest wall subcutaneous emphysema. IMPRESSION: 1. Tracheostomy tube, NG tube, left chest tube in stable position. No pneumothorax. 2. Low lung volumes with progressive bibasilar atelectasis and/or infiltrates. Bibasilar pneumonia cannot be excluded. Small bilateral pleural effusions cannot be excluded. Electronically Signed   By: Marcello Moores  Register   On: 01/24/2016 06:39   Dg Chest Port 1 View  Result Date: 01/23/2016 CLINICAL DATA:  Chest tube EXAM: PORTABLE CHEST 1 VIEW COMPARISON:  01/23/2016 FINDINGS: Tracheostomy remains in good position. NG tube enters the stomach with the tip not visualized. Large left pneumothorax unchanged from earlier today. Interval placement of small bore chest tube catheter anteriorly on the left without significant change in pneumothorax. Mild shift of the right suggesting tension. Left lower lobe collapse unchanged. Right lower lobe atelectasis unchanged. IMPRESSION: Small bore chest tube on the left without significant change in large left tension pneumothorax. Electronically Signed   By: Franchot Gallo M.D.   On: 01/23/2016  07:53   Dg Chest Port 1 View  Result Date: 01/23/2016 CLINICAL DATA:  Left chest tube EXAM: PORTABLE CHEST 1 VIEW COMPARISON:  Chest radiograph from earlier today. FINDINGS: Left apical chest tube is in place. Tracheostomy tube tip overlies the tracheal air column at the thoracic inlet. Enteric tube enters stomach with the tip not seen on this image. Stable cardiomediastinal silhouette with top-normal heart size. No appreciable residual pneumothorax. No residual mediastinal shift. No pleural effusion. Patchy opacities throughout both lungs, most prominent at the lung bases, not definitely changed.  Extensive subcutaneous emphysema in the left chest wall appears increased. Partially visualized bilateral spinal fusion hardware overlies the lower thoracic spine. IMPRESSION: 1. No residual left pneumothorax status post left chest tube placement. No residual mediastinal shift. Prominent subcutaneous emphysema in the left chest wall. 2. Patchy opacities throughout both lungs, not appreciably changed, most prominent at the lung bases, suspicious for multifocal pneumonia. Electronically Signed   By: Ilona Sorrel M.D.   On: 01/23/2016 07:32   Dg Chest Port 1 View  Result Date: 01/23/2016 CLINICAL DATA:  Acute onset of shortness of breath. Initial encounter. EXAM: PORTABLE CHEST 1 VIEW COMPARISON:  Chest radiograph performed 01/17/2016 FINDINGS: The patient's tracheostomy tube is seen ending 7 cm above the carina. There is a very large left-sided pneumothorax, with mild rightward mediastinal shift, concerning for some degree of tension. Right-sided atelectasis and vascular congestion are seen. Mild underlying airspace opacity may reflect interstitial edema or pneumonia. No pleural effusion is identified. The cardiomediastinal silhouette remains normal in size. Thoracolumbar spinal fusion hardware is partially imaged. No acute osseous abnormalities are seen. An enteric tube is noted extending below the diaphragm.  IMPRESSION: 1. Tracheostomy tube seen ending 7 cm above the carina. 2. New very large left-sided pneumothorax, with mild rightward mediastinal shift, concerning for some degree of tension. 3. Right-sided atelectasis and vascular congestion seen. Mild underlying airspace opacity may reflect interstitial edema or pneumonia. Critical Value/emergent results were called by telephone at the time of interpretation on 01/23/2016 at 5:26 am to Dr. Tamala Julian, who verbally acknowledged these results. Electronically Signed   By: Garald Balding M.D.   On: 01/23/2016 05:41   Dg Shoulder Left Port  Result Date: 01/23/2016 CLINICAL DATA:  Shoulder pain with movement. EXAM: LEFT SHOULDER - 1 VIEW COMPARISON:  Chest radiography same day. FINDINGS: Extensive overlying artifact. No evidence of fracture or dislocation. Probable previous surgery in the region the distal clavicle and acromion. Narrowed humeral acromial distance probably indicates chronic rotator cuff disease. IMPRESSION: Probable previous acromioplasty. 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 No acute finding. Electronically Signed   By: Nelson Chimes M.D.   On: 01/23/2016 20:56   DISCUSSION: 75 yo male with recent admit and dc from [facility] with baseline RA , steroid dependent, developed ARDS and required tracheostomy. Renal function was stage 3 but he did come off CVVH. Transferred to [facility] 8/9 and PCCM consulted on same day. He spent 12 hours on trach collar 8/10 but on 8/11 he was back on full vent support with rr 37 , severe air hunger and anxiety. FOB per Dr. Rosina Lowenstein with rt lower lobe with copious thick white secretions. Due ti his decline aand family wishes he will be transferred to [facility] and to ICU and treated for suspected HCAP.   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  P:   Full vent protocol, no sedation. Left chest tube to suction. Titrate O2 for sat of 88-92%.  CARDIOVASCULAR A:  CAD, Mod  AS A-fib with RVR.  P:  Manage with femroal cvfl Map goal > 85 Cardizem drip. No pressors.  RENAL Lab Results  Component Value Date   CREATININE 2.95 (H) 01/24/2016   CREATININE 2.92 (H) 01/23/2016   CREATININE 2.99 (H) 01/23/2016   CREATININE 1.70 (H) 12/08/2015   Recent Labs Lab 01/23/16 0458 01/23/16 1548 01/24/16 0505  NA 147* 147* 146*   A:   CKD stage 3 Recent CVVH stopped 8/3  P:   Continue  bicarb gtt KVO other IVF, 15L positive. Renal consult - Dr Jonnie Finner called, ?dialysis candidate. Avoid nephrotoxins Replace electrolytes as indicated.  GASTROINTESTINAL A:   Elevated LFT - improved 8/13  P:   TF per nutrition.  HEMATOLOGIC  A:   Anticoagulation for Hx of Afib,coumadin currently on hold  P:  Hold off IV heparin (low platelets) pending family meeting.  INFECTIOUS A:   Staph likely MRSA bacteremia and HCAP  P ID consult appreciated.  ENDOCRINE A:   No acute issues  P:   Monitor glucose Stress steroids due to recent steroids   NEUROLOGIC A:   Anxiety in setting of resp ditress  Deeply seated  P:   RASS goal:-3 on fent gtt Focus more on comfort at this point.  FAMILY  - Updates: Spoke with 2 sons, they indicate that patient would not want aggressive care unless he is able to walk, go outside and on his tractor.  I advised them to gather family together for a meeting which they will do today.  - Inter-disciplinary family meet or Palliative Care meeting due by:  Done 01/22/16.  See above.  The patient is critically ill with multiple organ systems failure and requires high complexity decision making for assessment and support, frequent evaluation and titration of therapies, application of advanced monitoring technologies and extensive interpretation of multiple databases.   Critical Care Time devoted to patient care services described in this note is  35  Minutes. This time reflects time of care of this signee Dr Jennet Maduro. This  critical care time does not reflect procedure time, or teaching time or supervisory time of PA/NP/Med student/Med Resident etc but could involve care discussion time.  Rush Farmer, M.D. [facility] Pulmonary/Critical Care Medicine. Pager: [phone]. After hours pager: [phone].  01/24/2016 10:40 AM

## 2016-01-24 NOTE — Progress Notes (Signed)
[place] Progress Note Patient Name: Curtis Frazier DOB: 1941/01/22 MRN: [id number]   Date of Service  01/24/2016  HPI/Events of Note  Ca++ = 6.2 and albumin = 1.3. Ca++ corrects to 8.36.  eICU Interventions  Will replete Ca++.     Intervention Category Intermediate Interventions: Electrolyte abnormality - evaluation and management  Sommer,Steven Eugene 01/24/2016, 5:50 AM

## 2016-01-24 NOTE — Progress Notes (Signed)
[place] for Heparin  Indication: atrial fibrillation  Allergies  Allergen Reactions  . Ace Inhibitors Other (See Comments)    Causes high K   . Angiotensin Receptor Blockers Other (See Comments)    Causes high K   . Metoprolol Other (See Comments)    May cause elevated K level   . Penicillins Swelling and Rash    Has patient had a PCN reaction causing immediate rash, facial/tongue/throat swelling, SOB or lightheadedness with hypotension: YES Has patient had a PCN reaction causing severe rash involving mucus membranes or skin necrosis: NO Has patient had a PCN reaction that required hospitalization NO Has patient had a PCN reaction occurring within the last 10 years: NO If all of the above answers are "NO", then may proceed with Cephalosporin use. Pt has tolerated cephalosporins in the past   . Amiodarone Other (See Comments)    12/27/15: Cardiologist discontinued the amiodarone (on PTA) due to  concern for toxicity  (in hospitial with acute hypoxemic respiratory failure)   . Tetracycline Swelling and Rash    Patient Measurements: Weight: 179 lb 7.3 oz (81.4 kg) Heparin Dosing Weight: 68.4 kg  Vital Signs: Temp: 98.6 F (37 C) (08/14 1126) Temp Source: Oral (08/14 1126) BP: 80/43 (08/14 1225) Pulse Rate: 106 (08/14 1225)  Labs:  Recent Labs  01/30/2016 1447  02/04/2016 2033  01/22/16 0415 01/22/16 1010 01/22/16 2035 01/23/16 0458 01/23/16 0959 01/23/16 1548 01/23/16 1815 01/24/16 0505  HGB 9.4*  < >  --   < >  --   --   --  8.2* 7.2*  --  7.7* 7.7*  HCT 31.1*  < >  --   < >  --   --   --  27.2* 23.5*  --  24.0* 24.1*  PLT 148*  < >  --   < >  --   --   --  99* 78*  --  94* 74*  APTT 31  --   --   --   --   --   --   --   --   --   --   --   LABPROT 15.7*  --   --   --   --   --   --   --   --   --   --   --   INR 1.24  --   --   --   --   --   --   --   --   --   --   --   HEPARINUNFRC  --   --   --   < >  --  <0.10* 0.31  0.18*  --   --   --   --   CREATININE 1.85*  < >  --   < >  --   --   --  2.99*  --  2.92*  --  2.95*  TROPONINI 0.11*  --  0.08*  --  0.06*  --   --   --   --   --   --   --   < > = values in this interval not displayed.  Estimated Creatinine Clearance: 20.6 mL/min (by C-G formula based on SCr of 2.95 mg/dL).   Assessment: Patient is a 75 yo male who is presented to ICU from [facility] with suspected HCAP. He was on warfarin for a fib, but this has been held for several days  now. Heparin has also been held since 8/12.  hgb 7.7, plts down to 74 - likely due to Zyvox.    Goal of Therapy:  Heparin level 0.3-0.7 units/ml Monitor platelets by anticoagulation protocol: Yes    Plan:  -Continue to hold heparin -Will re-start pending [place] meeting this afternoon     Hughes Better, PharmD, BCPS Clinical Pharmacist 01/24/2016 2:29 PM

## 2016-01-24 NOTE — Progress Notes (Signed)
Calcium 6.2 and Dr. Oletta Darter made aware.

## 2016-01-25 DIAGNOSIS — J939 Pneumothorax, unspecified: Secondary | ICD-10-CM

## 2016-01-25 DIAGNOSIS — J9602 Acute respiratory failure with hypercapnia: Secondary | ICD-10-CM

## 2016-01-25 LAB — GLUCOSE, CAPILLARY
GLUCOSE-CAPILLARY: 168 mg/dL — AB (ref 65–99)
GLUCOSE-CAPILLARY: 153 mg/dL — AB (ref 65–99)
Glucose-Capillary: 111 mg/dL — ABNORMAL HIGH (ref 65–99)
Glucose-Capillary: 128 mg/dL — ABNORMAL HIGH (ref 65–99)

## 2016-01-25 LAB — BASIC METABOLIC PANEL
Anion gap: 10 (ref 5–15)
BUN: 64 mg/dL — AB (ref 6–20)
CHLORIDE: 103 mmol/L (ref 101–111)
CO2: 34 mmol/L — AB (ref 22–32)
Calcium: 6.6 mg/dL — ABNORMAL LOW (ref 8.9–10.3)
Creatinine, Ser: 2.79 mg/dL — ABNORMAL HIGH (ref 0.61–1.24)
GFR calc Af Amer: 24 mL/min — ABNORMAL LOW (ref >60)
GFR, EST NON AFRICAN AMERICAN: 21 mL/min — AB (ref >60)
GLUCOSE: 182 mg/dL — AB (ref 65–99)
POTASSIUM: 3.1 mmol/L — AB (ref 3.5–5.1)
Sodium: 147 mmol/L — ABNORMAL HIGH (ref 135–145)

## 2016-01-25 LAB — CBC WITH DIFFERENTIAL/PLATELET
Basophils Absolute: 0 10*3/uL (ref 0.0–0.1)
Basophils Relative: 0 %
EOS PCT: 0 %
Eosinophils Absolute: 0 10*3/uL (ref 0.0–0.7)
HCT: 22.6 % — ABNORMAL LOW (ref 39.0–52.0)
Hemoglobin: 7.4 g/dL — ABNORMAL LOW (ref 13.0–17.0)
LYMPHS ABS: 0.5 10*3/uL — AB (ref 0.7–4.0)
LYMPHS PCT: 5 %
MCH: 29.4 pg (ref 26.0–34.0)
MCHC: 32.7 g/dL (ref 30.0–36.0)
MCV: 89.7 fL (ref 78.0–100.0)
Monocytes Absolute: 0.2 10*3/uL (ref 0.1–1.0)
Monocytes Relative: 2 %
Neutro Abs: 10.1 10*3/uL — ABNORMAL HIGH (ref 1.7–7.7)
Neutrophils Relative %: 93 %
PLATELETS: 50 10*3/uL — AB (ref 150–400)
RBC: 2.52 MIL/uL — AB (ref 4.22–5.81)
RDW: 19.8 % — ABNORMAL HIGH (ref 11.5–15.5)
WBC: 10.9 10*3/uL — AB (ref 4.0–10.5)

## 2016-01-25 LAB — CULTURE, BLOOD (ROUTINE X 2)

## 2016-01-25 LAB — PHOSPHORUS: Phosphorus: 4.7 mg/dL — ABNORMAL HIGH (ref 2.5–4.6)

## 2016-01-25 LAB — MAGNESIUM: Magnesium: 2.1 mg/dL (ref 1.7–2.4)

## 2016-01-25 MED ORDER — CHLORHEXIDINE GLUCONATE 0.12% ORAL RINSE (MEDLINE KIT)
15.0000 mL | Freq: Two times a day (BID) | OROMUCOSAL | Status: DC
  Administered 2016-01-25 – 2016-01-27 (×4): 15 mL via OROMUCOSAL

## 2016-01-25 MED ORDER — ANTISEPTIC ORAL RINSE SOLUTION (CORINZ)
7.0000 mL | Freq: Four times a day (QID) | OROMUCOSAL | Status: DC
  Administered 2016-01-25 – 2016-01-27 (×7): 7 mL via OROMUCOSAL

## 2016-01-25 NOTE — Progress Notes (Signed)
No wakeup assessment today due to planned withdrawal of care.

## 2016-01-25 NOTE — Progress Notes (Signed)
[place] for Heparin  Indication: atrial fibrillation  Allergies  Allergen Reactions  . Ace Inhibitors Other (See Comments)    Causes high K   . Angiotensin Receptor Blockers Other (See Comments)    Causes high K   . Metoprolol Other (See Comments)    May cause elevated K level   . Penicillins Swelling and Rash    Has patient had a PCN reaction causing immediate rash, facial/tongue/throat swelling, SOB or lightheadedness with hypotension: YES Has patient had a PCN reaction causing severe rash involving mucus membranes or skin necrosis: NO Has patient had a PCN reaction that required hospitalization NO Has patient had a PCN reaction occurring within the last 10 years: NO If all of the above answers are "NO", then may proceed with Cephalosporin use. Pt has tolerated cephalosporins in the past   . Amiodarone Other (See Comments)    12/27/15: Cardiologist discontinued the amiodarone (on PTA) due to  concern for toxicity  (in hospitial with acute hypoxemic respiratory failure)   . Tetracycline Swelling and Rash    Patient Measurements: Weight: 183 lb 3.2 oz (83.1 kg) Heparin Dosing Weight: 68.4 kg  Vital Signs: Temp: 97 F (36.1 C) (08/15 0810) Temp Source: Axillary (08/15 0810) BP: 77/54 (08/15 1000) Pulse Rate: 101 (08/15 1000)  Labs:  Recent Labs  01/22/16 2035  01/23/16 0458  01/23/16 1548 01/23/16 1815 01/24/16 0505 01/25/16 0357  HGB  --   --  8.2*  < >  --  7.7* 7.7* 7.4*  HCT  --   --  27.2*  < >  --  24.0* 24.1* 22.6*  PLT  --   --  99*  < >  --  94* 74* 50*  HEPARINUNFRC 0.31  --  0.18*  --   --   --   --   --   CREATININE  --   < > 2.99*  --  2.92*  --  2.95* 2.79*  < > = values in this interval not displayed.  Estimated Creatinine Clearance: 23.7 mL/min (by C-G formula based on SCr of 2.79 mg/dL).   Assessment: Patient is a 75 yo male who is presented to ICU from [facility] with suspected HCAP. He was on warfarin  for a fib, but this has been held for several days now. Heparin has also been held since 8/12.  hgb 7.7, plts down to 74 - likely due to Zyvox.   Goal of Therapy:  Heparin level 0.3-0.7 units/ml Monitor platelets by anticoagulation protocol: Yes    Plan:  - Heparin was D/C'ed due to goals of care.   Demetrius Charity, PharmD Acute Care Pharmacy Resident  Pager: [phone] 01/25/2016

## 2016-01-25 NOTE — Progress Notes (Signed)
Arterial line has been frequently dampened throughout the day today. Patient moves his had and art line becomes dampened. BP has remained stable on pressors at current rate.

## 2016-01-25 NOTE — Care Management Note (Signed)
Case Management Note  Patient Details  Name: Curtis Frazier MRN: [id number] Date of Birth: 1941-03-20  Subjective/Objective:        75 yo male with recent admit and dc from [facility] with baseline RA , steroid dependent, developed ARDS and required tracheostomy. Renal function was stage 3 but he did come off CVVH. Transferred to [facility] 8/9 and PCCM consulted on same day. He spent 12 hours on trach collar 8/10 but on 8/11 he was back on full vent support with rr 37 , severe air hunger and anxiety. FOB per Dr. Rosina Lowenstein with rt lower lobe with copious thick white secretions. Due ti his decline aand family wishes he will be transferred to [facility] and to ICU and treated for suspected HCAP.  Action/Plan: Pt discharged to [facility] 01/19/16.  Prior to that pt noted to be   active with [facility].  CM will continue to follow for discharge needs   Expected Discharge Date:                  Expected Discharge Plan:  Long Term Acute Care (LTAC)  In-House Referral:     Discharge planning Services  CM Consult  Post Acute Care Choice:    Choice offered to:     DME Arranged:    DME Agency:     HH Arranged:    HH Agency:     Status of Service:  In process, will continue to follow  If discussed at Long Length of Stay Meetings, dates discussed:    Additional Comments:  Maryclare Labrador, RN 01/25/2016, 11:54 AM Case Management Note  Patient Details  Name: [name] MRN: [id number] Date of Birth: 1941-03-27  Subjective/Objective:                    Action/Plan:   Expected Discharge Date:                  Expected Discharge Plan:  Long Term Acute Care (LTAC)  In-House Referral:     Discharge planning Services  CM Consult  Post Acute Care Choice:    Choice offered to:     DME Arranged:    DME Agency:     HH Arranged:    HH Agency:     Status of Service:  In process, will continue to follow  If discussed at Long Length of Stay Meetings, dates discussed:     Additional Comments: 01/25/2016 Plan;  Withdrawal of care planned Maryclare Labrador, RN 01/25/2016, 11:54 AM

## 2016-01-25 NOTE — Progress Notes (Signed)
PULMONARY / CRITICAL CARE MEDICINE   Name: Curtis Frazier MRN: [id number] DOB: Mar 06, 1941    ADMISSION DATE:  01/26/2016   REFERRING MD: [facility]  CHIEF COMPLAINT:  Acute on chronic resp failure  BRIEF 75 yo male with recent admit and dc from [facility] with baseline RA , steroid dependent, developed ARDS and required tracheostomy. Renal function was stage 3 but he did come off CVVH. Transferred to [facility] 8/9 and PCCM consulted on same day. He spent 12 hours on trach collar 8/10 but on 8/11 he was back on full vent support with rr 37 , severe air hunger and anxiety. FOB per Dr. Rosina Lowenstein with rt lower lobe with copious thick white secretions. Due ti his decline aand family wishes he will be transferred to [facility] and to ICU and treated for suspected HCAP.   CULTURES: 8/9 sptum GNR>> 8/11 bal>> 8/11 bc x 2>> GPC 8/11 uc>> 8/12 procal>>90  ANTIBIOTICS: 8/11 zyvox>> 8/11 meropenem >>  EVENTS: Trach 8/6>> 8/9./17 - dc to [facility] from [facility] 8/11 transferred from [facility] to [facility], aline placed 8/11 2 d>> 8/11 Abd US>> 01/24/2016 - femoral line and aline 01/22/16 - on 38mcg levophed. On vasopression. ID says he is bacteermie with possible MRSA. Ltd code. On IV heparin for A Fib. Severe metab acidosis despite bic gtt. Creat getting worse. shiock liver some btter. Daughter at bedside  SUBJECTIVE/OVERNIGHT/INTERVAL HX Remains critically ill.  VITAL SIGNS: BP (!) 95/59   Pulse 100   Temp 97 F (36.1 C) (Axillary)   Resp (!) 28   Wt 83.1 kg (183 lb 3.2 oz)   SpO2 99%   BMI 27.86 kg/m   HEMODYNAMICS:    VENTILATOR SETTINGS: Vent Mode: PRVC FiO2 (%):  [30 %-40 %] 30 % Set Rate:  [28 bmp] 28 bmp Vt Set:  [500 mL] 500 mL PEEP:  [8 cmH20] 8 cmH20 Plateau Pressure:  [18 cmH20-24 cmH20] 20 cmH20  INTAKE / OUTPUT: I/O last 3 completed shifts: In: 7701.4 [I.V.:6041.4; NG/GT:350; IV Piggyback:1310] Out: 3750 [Urine:2000; Emesis/NG output:1750]  PHYSICAL EXAMINATION:  General:   full vent support Neuro:  sedated , RASS -2 HEENT:  [place]/AT, #6 cuffed trach in place Cardiovascular:  RRR, no MRG Lungs:  Coarse bases, sync wuith vent, 40% fio2 Abdomen:  Soft, slight tenderness, non-distended Musculoskeletal:  No acute deformity  Skin:  Grossly intact  LABS: PULMONARY  Recent Labs Lab 01/22/16 2035 01/23/16 0211 01/23/16 0905 01/23/16 1450 01/24/16 0403  PHART 7.258* 7.186* 7.295* 7.335* 7.352  PCO2ART 34.2* 36.1 46.9* 46.9* 47.5*  PO2ART 65.9* 63.9* 120* 72.5* 68.3*  HCO3 14.8* 13.1* 22.1 24.3* 25.4*  TCO2 15.9 14.2 23.5 25.7 26.8  O2SAT 89.4 84.8 97.6 92.5 88.6   CBC  Recent Labs Lab 01/23/16 1815 01/24/16 0505 01/25/16 0357  HGB 7.7* 7.7* 7.4*  HCT 24.0* 24.1* 22.6*  WBC 14.4* 14.9* 10.9*  PLT 94* 74* 50*   COAGULATION  Recent Labs Lab 01/20/16 0540 01/30/2016 1447  INR 1.15 1.24   CARDIAC    Recent Labs Lab 01/28/2016 1447 01/29/2016 2033 01/22/16 0415  TROPONINI 0.11* 0.08* 0.06*   No results for input(s): PROBNP in the last 168 hours.  CHEMISTRY  Recent Labs Lab 01/22/16 0201 01/23/16 0458 01/23/16 1222 01/23/16 1548 01/23/16 1700 01/24/16 0505 01/24/16 1700 01/25/16 0357  NA 146* 147*  --  147*  --  146*  --  147*  K 5.1 4.3  --  3.9  --  3.6  --  3.1*  CL 115* 112*  --  113*  --  108  --  103  CO2 19* 18*  --  26  --  27  --  34*  GLUCOSE 413* 340*  --  94  --  179*  --  182*  BUN 54* 64*  --  64*  --  67*  --  64*  CREATININE 2.35* 2.99*  --  2.92*  --  2.95*  --  2.79*  CALCIUM 7.1* 6.1*  --  6.1*  --  6.2*  --  6.6*  MG 1.7 1.5* 1.6*  --  1.5* 1.5* 1.3* 2.1  PHOS 7.2* 6.2* 5.4*  --  5.2* 5.4* 4.7* 4.7*   Estimated Creatinine Clearance: 23.7 mL/min (by C-G formula based on SCr of 2.79 mg/dL).  LIVER  Recent Labs Lab 01/19/16 0345 01/20/16 0540 01/19/2016 0550 01/20/2016 1447 02/03/2016 1859  AST  --  256* 215* 156* 148*  ALT  --  229* 255* 230* 216*  ALKPHOS  --  685* 745* 688* 661*  BILITOT  --  1.5* 0.9 1.1 1.1  PROT  --  5.2*  4.6* 4.4* 4.2*  ALBUMIN 1.8* 1.9* 1.5*  1.5* 1.4* 1.3*  INR  --  1.15  --  1.24  --    INFECTIOUS  Recent Labs Lab 01/26/2016 1859  01/22/16 0201 01/23/16 0000 01/23/16 0458 01/23/16 1544  LATICACIDVEN  --   < >  --  8.7* 7.0* 3.0*  PROCALCITON 28.30  --  90.54  --  79.69  --   < > = values in this interval not displayed.  ENDOCRINE CBG (last 3)   Recent Labs  01/24/16 2344 01/25/16 0338 01/25/16 0804  GLUCAP 128* 168* 153*   IMAGING x48h  - image(s) personally visualized  -   highlighted in bold Dg Chest Port 1 View  Result Date: 01/24/2016 CLINICAL DATA:  Pneumothorax. EXAM: PORTABLE CHEST 1 VIEW COMPARISON:  01/23/2016.  01/19/2016. FINDINGS: Tracheostomy tube, NG tube, left chest tube in stable position.Heart size stable. Low lung volumes with progression of bibasilar atelectasis and/or infiltrates. Small bilateral pleural effusions cannot be excluded. No pneumothorax. Mild left chest wall subcutaneous emphysema. IMPRESSION: 1. Tracheostomy tube, NG tube, left chest tube in stable position. No pneumothorax. 2. Low lung volumes with progressive bibasilar atelectasis and/or infiltrates. Bibasilar pneumonia cannot be excluded. Small bilateral pleural effusions cannot be excluded. Electronically Signed   By: Marcello Moores  Register   On: 01/24/2016 06:39   Dg Shoulder Left Port  Result Date: 01/23/2016 CLINICAL DATA:  Shoulder pain with movement. EXAM: LEFT SHOULDER - 1 VIEW COMPARISON:  Chest radiography same day. FINDINGS: Extensive overlying artifact. No evidence of fracture or dislocation. Probable previous surgery in the region the distal clavicle and acromion. Narrowed humeral acromial distance probably indicates chronic rotator cuff disease. IMPRESSION: Probable previous acromioplasty. 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 No acute finding. Electronically Signed   By: Nelson Chimes M.D.   On: 01/23/2016 20:56   DISCUSSION: 75 yo male with recent admit and dc from [facility] with baseline  RA , steroid dependent, developed ARDS and required tracheostomy. Renal function was stage 3 but he did come off CVVH. Transferred to [facility] 8/9 and PCCM consulted on same day. He spent 12 hours on trach collar 8/10 but on 8/11 he was back on full vent support with rr 37 , severe air hunger and anxiety. FOB per Dr. Rosina Lowenstein with rt lower lobe with copious thick white secretions. Due ti his decline aand family wishes he will be transferred to  [facility] and to ICU and treated for suspected HCAP.   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  P:   Disconnect from vent when family arrives and is ready. Left chest tube to suction. Titrate O2 for sat of 88-92%.  CARDIOVASCULAR A:  CAD, Mod AS A-fib with RVR.  P:  Manage with femroal cvl Map goal > 65 D/C Cardizem. Do not titrate pressors.  RENAL Lab Results  Component Value Date   CREATININE 2.79 (H) 01/25/2016   CREATININE 2.95 (H) 01/24/2016   CREATININE 2.92 (H) 01/23/2016   CREATININE 1.70 (H) 12/08/2015   Recent Labs Lab 01/23/16 1548 01/24/16 0505 01/25/16 0357  NA 147* 146* 147*   A:   CKD stage 3 Recent CVVH stopped 8/3  P:   D/C bicarb drip. KVO other IVF, 18L positive. D/C blood draws.  GASTROINTESTINAL A:   Elevated LFT - improved 8/13  P:   D/C TF.  HEMATOLOGIC  A:   Anticoagulation for Hx of Afib,coumadin currently on hold  P:  D/C blood draws.  INFECTIOUS A:   Staph likely MRSA bacteremia and HCAP  P D/c abx.  ENDOCRINE A:   No acute issues  P:   D/C stress dose steroids.  NEUROLOGIC A:   Anxiety in setting of resp ditress  P:   Versed and fentanyl for comfort.  FAMILY  - Updates: Spoke with family yesterday, ready for withdrawal today, will await arrival then will d/c pressors and disconnect from vent with fentanyl and versed on board.  - Inter-disciplinary family meet or Palliative Care meeting due by:  Done 01/22/16.  See  above.  The patient is critically ill with multiple organ systems failure and requires high complexity decision making for assessment and support, frequent evaluation and titration of therapies, application of advanced monitoring technologies and extensive interpretation of multiple databases.   Critical Care Time devoted to patient care services described in this note is  35  Minutes. This time reflects time of care of this signee Dr Jennet Maduro. This critical care time does not reflect procedure time, or teaching time or supervisory time of PA/NP/Med student/Med Resident etc but could involve care discussion time.  Rush Farmer, M.D. [facility] Pulmonary/Critical Care Medicine. Pager: [phone]. After hours pager: [phone].  01/25/2016 10:13 AM

## 2016-01-25 NOTE — Progress Notes (Signed)
Despite conversations concerning withdrawal of care this morning with family. Entire family did not show up. Family stated to me "could we wait until Thursday because his grandson's birthday is tomorrow." I updated Dr. Nelda Marseille and orders received. Advised to keep on pressors and continue present care as ordered. MD to follow up with family again in AM.

## 2016-01-26 DIAGNOSIS — J9621 Acute and chronic respiratory failure with hypoxia: Secondary | ICD-10-CM

## 2016-01-26 LAB — CULTURE, BLOOD (ROUTINE X 2): Culture: NO GROWTH

## 2016-01-26 NOTE — Progress Notes (Signed)
PULMONARY / CRITICAL CARE MEDICINE   Name: Curtis Frazier MRN: [id number] DOB: [birth year]    ADMISSION DATE:  02/05/2016   REFERRING MD: [facility]  CHIEF COMPLAINT:  Acute on chronic resp failure  BRIEF 75 yo male with recent admit and dc from [facility] with baseline RA , steroid dependent, developed ARDS and required tracheostomy. Renal function was stage 3 but he did come off CVVH. Transferred to [facility] 8/9 and PCCM consulted on same day. He spent 12 hours on trach collar 8/10 but on 8/11 he was back on full vent support with rr 37 , severe air hunger and anxiety. FOB per Dr. Rosina Lowenstein with rt lower lobe with copious thick white secretions. Due ti his decline aand family wishes he will be transferred to [facility] and to ICU and treated for suspected HCAP.   CULTURES: 8/9 sptum GNR>> 8/11 bal>> 8/11 bc x 2>> MRSA 8/11 uc>> 8/12 procal>>90  ANTIBIOTICS: 8/11 Zyvox>>8/15 8/11 Meropenem >>8/15  EVENTS: Trach 8/6>> 8/9./17 - dc to selec from [place] 8/11 transferred from [facility] to [facility], aline placed 8/11 2 d>> 8/11 Abd US>> 01/24/2016 - femoral line and aline 01/22/16 - on 61mcg levophed. On vasopression. ID says he is bacteermie with possible MRSA. Ltd code. On IV heparin for A Fib. Severe metab acidosis despite bic gtt. Creat getting worse. shiock liver some btter. Daughter at bedside  SUBJECTIVE/OVERNIGHT/INTERVAL HX No events overnight.  Comfortable on current settings.  VITAL SIGNS: BP (!) 85/45 (BP Location: Left Arm)   Pulse 73   Temp 98.7 F (37.1 C) (Oral)   Resp (!) 28   Wt 84.5 kg (186 lb 4.6 oz)   SpO2 94%   BMI 28.33 kg/m   HEMODYNAMICS:    VENTILATOR SETTINGS: Vent Mode: PRVC FiO2 (%):  [30 %] 30 % Set Rate:  [28 bmp] 28 bmp Vt Set:  [500 mL] 500 mL PEEP:  [5 cmH20] 5 cmH20 Plateau Pressure:  [13 cmH20-19 cmH20] 15 cmH20  INTAKE / OUTPUT: I/O last 3 completed shifts: In: 4073.8 [I.V.:3723.8; IV Piggyback:350] Out: 2460 [Urine:1905; Emesis/NG output:505; Chest  Tube:50]  PHYSICAL EXAMINATION:  General:   full vent support Neuro: sedated , RASS -2 HEENT:  [place]/AT, #6 cuffed trach in place Cardiovascular:  RRR, no MRG Lungs:  Coarse bases, sync wuith vent, 40% fio2 Abdomen:  Soft, slight tenderness, non-distended Musculoskeletal:  No acute deformity  Skin:  Grossly intact  LABS: PULMONARY  Recent Labs Lab 01/22/16 2035 01/23/16 0211 01/23/16 0905 01/23/16 1450 01/24/16 0403  PHART 7.258* 7.186* 7.295* 7.335* 7.352  PCO2ART 34.2* 36.1 46.9* 46.9* 47.5*  PO2ART 65.9* 63.9* 120* 72.5* 68.3*  HCO3 14.8* 13.1* 22.1 24.3* 25.4*  TCO2 15.9 14.2 23.5 25.7 26.8  O2SAT 89.4 84.8 97.6 92.5 88.6   CBC  Recent Labs Lab 01/23/16 1815 01/24/16 0505 01/25/16 0357  HGB 7.7* 7.7* 7.4*  HCT 24.0* 24.1* 22.6*  WBC 14.4* 14.9* 10.9*  PLT 94* 74* 50*   COAGULATION  Recent Labs Lab 01/20/16 0540 01/24/2016 1447  INR 1.15 1.24   CARDIAC    Recent Labs Lab 02/01/2016 1447 01/26/2016 2033 01/22/16 0415  TROPONINI 0.11* 0.08* 0.06*   No results for input(s): PROBNP in the last 168 hours.  CHEMISTRY  Recent Labs Lab 01/22/16 0201 01/23/16 0458 01/23/16 1222 01/23/16 1548 01/23/16 1700 01/24/16 0505 01/24/16 1700 01/25/16 0357  NA 146* 147*  --  147*  --  146*  --  147*  K 5.1 4.3  --  3.9  --  3.6  --  3.1*  CL 115* 112*  --  113*  --  108  --  103  CO2 19* 18*  --  26  --  27  --  34*  GLUCOSE 413* 340*  --  94  --  179*  --  182*  BUN 54* 64*  --  64*  --  67*  --  64*  CREATININE 2.35* 2.99*  --  2.92*  --  2.95*  --  2.79*  CALCIUM 7.1* 6.1*  --  6.1*  --  6.2*  --  6.6*  MG 1.7 1.5* 1.6*  --  1.5* 1.5* 1.3* 2.1  PHOS 7.2* 6.2* 5.4*  --  5.2* 5.4* 4.7* 4.7*   Estimated Creatinine Clearance: 23.8 mL/min (by C-G formula based on SCr of 2.79 mg/dL).  LIVER  Recent Labs Lab 01/20/16 0540 02/09/2016 0550 01/15/2016 1447 01/16/2016 1859  AST 256* 215* 156* 148*  ALT 229* 255* 230* 216*  ALKPHOS 685* 745* 688* 661*   BILITOT 1.5* 0.9 1.1 1.1  PROT 5.2* 4.6* 4.4* 4.2*  ALBUMIN 1.9* 1.5*  1.5* 1.4* 1.3*  INR 1.15  --  1.24  --    INFECTIOUS  Recent Labs Lab 01/30/2016 1859  01/22/16 0201 01/23/16 0000 01/23/16 0458 01/23/16 1544  LATICACIDVEN  --   < >  --  8.7* 7.0* 3.0*  PROCALCITON 28.30  --  90.54  --  79.69  --   < > = values in this interval not displayed.  ENDOCRINE CBG (last 3)   Recent Labs  01/25/16 0804 01/25/16 1940 01/25/16 2353  GLUCAP 153* 128* 111*   IMAGING x48h  - image(s) personally visualized  -   highlighted in bold No results found. DISCUSSION: 75 yo male with recent admit and dc from [facility] with baseline RA , steroid dependent, developed ARDS and required tracheostomy. Renal function was stage 3 but he did come off CVVH. Transferred to [facility] 8/9 and PCCM consulted on same day. He spent 12 hours on trach collar 8/10 but on 8/11 he was back on full vent support with rr 37 , severe air hunger and anxiety. FOB per Dr. Rosina Lowenstein with rt lower lobe with copious thick white secretions. Due ti his decline aand family wishes he will be transferred to [facility] and to ICU and treated for suspected HCAP.   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  P:   Comfort care in AM per family discussion today. Left chest tube to suction - will maintain. Titrate O2 for sat of 88-92%.  CARDIOVASCULAR A:  CAD, Mod AS A-fib with RVR.  P:  Manage with femroal cvl for now until withdrawal in AM. Map goal > 65. D/C Cardizem. Do not titrate pressors up.  RENAL Lab Results  Component Value Date   CREATININE 2.79 (H) 01/25/2016   CREATININE 2.95 (H) 01/24/2016   CREATININE 2.92 (H) 01/23/2016   CREATININE 1.70 (H) 12/08/2015   Recent Labs Lab 01/23/16 1548 01/24/16 0505 01/25/16 0357  NA 147* 146* 147*   A:   CKD stage 3 Recent CVVH stopped 8/3  P:   D/C bicarb drip. KVO other IVF, 18L positive. D/C blood  draws.  GASTROINTESTINAL A:   Elevated LFT - improved 8/13  P:   D/C TF.  HEMATOLOGIC  A:   Anticoagulation for Hx of Afib,coumadin currently on hold  P:  D/C blood draws.  INFECTIOUS A:  Staph likely MRSA bacteremia and HCAP  P D/c abx.  ENDOCRINE A:   No acute issues  P:   D/C stress dose steroids.  NEUROLOGIC A:   Anxiety in setting of resp ditress  P:   Versed and fentanyl for comfort.  FAMILY  - Updates: Spoke with son, family decided on withdrawal in AM when rest of the family arrives.  - Inter-disciplinary family meet or Palliative Care meeting due by:  Done 01/22/16.  See above.  The patient is critically ill with multiple organ systems failure and requires high complexity decision making for assessment and support, frequent evaluation and titration of therapies, application of advanced monitoring technologies and extensive interpretation of multiple databases.   Critical Care Time devoted to patient care services described in this note is  35  Minutes. This time reflects time of care of this signee Dr Jennet Maduro. This critical care time does not reflect procedure time, or teaching time or supervisory time of PA/NP/Med student/Med Resident etc but could involve care discussion time.  Rush Farmer, M.D. [facility] Pulmonary/Critical Care Medicine. Pager: [phone]. After hours pager: [phone].  01/26/2016 10:24 AM

## 2016-01-27 ENCOUNTER — Encounter: Payer: Self-pay | Admitting: Cardiovascular Disease

## 2016-01-27 DIAGNOSIS — A419 Sepsis, unspecified organism: Secondary | ICD-10-CM

## 2016-01-27 MED ORDER — FENTANYL BOLUS VIA INFUSION
50.0000 ug | INTRAVENOUS | Status: DC | PRN
  Filled 2016-01-27: qty 200

## 2016-01-27 MED ORDER — SODIUM CHLORIDE 0.9 % IV SOLN
10.0000 mg/h | INTRAVENOUS | Status: DC
  Administered 2016-01-27: 1 mg/h via INTRAVENOUS
  Filled 2016-01-27: qty 10

## 2016-01-27 MED ORDER — SODIUM CHLORIDE 0.9 % IV SOLN
200.0000 ug/h | INTRAVENOUS | Status: DC
  Administered 2016-01-27: 100 ug/h via INTRAVENOUS

## 2016-01-27 MED ORDER — MIDAZOLAM BOLUS VIA INFUSION (WITHDRAWAL LIFE SUSTAINING TX)
5.0000 mg | INTRAVENOUS | Status: DC | PRN
Start: 2016-01-27 — End: 2016-01-27
  Filled 2016-01-27: qty 20

## 2016-01-31 ENCOUNTER — Telehealth: Payer: Self-pay

## 2016-01-31 NOTE — Telephone Encounter (Signed)
On 02-29-2016 I received a death certificate from [facility] (Original). The death certificate is for burial. The patient is a patient of Doctor Nelda Marseille. The death certificate will be taken to [facility]) this am for signature.  On February 29, 2016 I received the death certificate back from Doctor Nelda Marseille. I got the death certificate ready and called the funeral home to let them know the death certificate is ready for pickup.

## 2016-02-11 NOTE — Progress Notes (Signed)
   February 18, 2016 1000  Clinical Encounter Type  Visited With Family  Visit Type Patient actively dying  Referral From Nurse  Spiritual Encounters  Spiritual Needs Emotional;Grief support  Stress Factors  Family Stress Factors Loss  Chaplain responded to request to attend extubation on 16M. Offered prayer and emotional support, but son offered prayer that left nothing to be said, and family pastor arrived immediately after. Offered  My condolences to family and left. Oscar Forman, Chaplain

## 2016-02-11 NOTE — Progress Notes (Signed)
PULMONARY / CRITICAL CARE MEDICINE   Name: Curtis Frazier MRN: [id number] DOB: Jan 09, 1941    ADMISSION DATE:  02/10/2016   REFERRING MD: [facility]  CHIEF COMPLAINT:  Acute on chronic resp failure  BRIEF 75 yo male with recent admit and dc from [facility] with baseline RA , steroid dependent, developed ARDS and required tracheostomy. Renal function was stage 3 but he did come off CVVH. Transferred to [facility] 8/9 and PCCM consulted on same day. He spent 12 hours on trach collar 8/10 but on 8/11 he was back on full vent support with rr 37 , severe air hunger and anxiety. FOB per Dr. Rosina Lowenstein with rt lower lobe with copious thick white secretions. Due ti his decline aand family wishes he will be transferred to [facility] and to ICU and treated for suspected HCAP.   CULTURES: 8/9 sptum GNR>> 8/11 bal>> 8/11 bc x 2>> MRSA 8/11 uc>> 8/12 procal>>90  ANTIBIOTICS: 8/11 Zyvox>>8/15 8/11 Meropenem >>8/15  EVENTS: Trach 8/6>> 8/9./17 - dc to [facility] from [place] 8/11 transferred from [facility] to [facility], aline placed 8/11 2 d>> 8/11 Abd US>> 01/23/2016 - femoral line and aline 01/22/16 - on 77mcg levophed. On vasopression. ID says he is bacteermie with possible MRSA. Ltd code. On IV heparin for A Fib. Severe metab acidosis despite bic gtt. Creat getting worse. shiock liver some btter. Daughter at bedside  SUBJECTIVE/OVERNIGHT/INTERVAL HX Appears comfortable this AM.  VITAL SIGNS: BP (!) 57/36   Pulse 85   Temp (!) 100.7 F (38.2 C) (Axillary)   Resp (!) 28   Wt 84.5 kg (186 lb 4.6 oz)   SpO2 98%   BMI 28.33 kg/m   HEMODYNAMICS:    VENTILATOR SETTINGS: Vent Mode: PRVC FiO2 (%):  [30 %-50 %] 40 % Set Rate:  [28 bmp] 28 bmp Vt Set:  [500 mL] 500 mL PEEP:  [5 cmH20] 5 cmH20 Plateau Pressure:  [16 cmH20-18 cmH20] 17 cmH20  INTAKE / OUTPUT: I/O last 3 completed shifts: In: 1416.5 [I.V.:1356.5; NG/GT:60] Out: 1900 [Urine:1295; Emesis/NG output:605]  PHYSICAL EXAMINATION:  General:   full vent support Neuro:  sedated , RASS -2 HEENT:  [place]/AT, #6 cuffed trach in place Cardiovascular:  RRR, no MRG Lungs:  Coarse bases, sync wuith vent, 40% fio2 Abdomen:  Soft, slight tenderness, non-distended Musculoskeletal:  No acute deformity  Skin:  Grossly intact  LABS: PULMONARY  Recent Labs Lab 01/22/16 2035 01/23/16 0211 01/23/16 0905 01/23/16 1450 01/24/16 0403  PHART 7.258* 7.186* 7.295* 7.335* 7.352  PCO2ART 34.2* 36.1 46.9* 46.9* 47.5*  PO2ART 65.9* 63.9* 120* 72.5* 68.3*  HCO3 14.8* 13.1* 22.1 24.3* 25.4*  TCO2 15.9 14.2 23.5 25.7 26.8  O2SAT 89.4 84.8 97.6 92.5 88.6   CBC  Recent Labs Lab 01/23/16 1815 01/24/16 0505 01/25/16 0357  HGB 7.7* 7.7* 7.4*  HCT 24.0* 24.1* 22.6*  WBC 14.4* 14.9* 10.9*  PLT 94* 74* 50*   COAGULATION  Recent Labs Lab 01/18/2016 1447  INR 1.24   CARDIAC    Recent Labs Lab 01/26/2016 1447 01/20/2016 2033 01/22/16 0415  TROPONINI 0.11* 0.08* 0.06*   No results for input(s): PROBNP in the last 168 hours.  CHEMISTRY  Recent Labs Lab 01/22/16 0201 01/23/16 0458 01/23/16 1222 01/23/16 1548 01/23/16 1700 01/24/16 0505 01/24/16 1700 01/25/16 0357  NA 146* 147*  --  147*  --  146*  --  147*  K 5.1 4.3  --  3.9  --  3.6  --  3.1*  CL 115* 112*  --  113*  --  108  --  103  CO2 19* 18*  --  26  --  27  --  34*  GLUCOSE 413* 340*  --  94  --  179*  --  182*  BUN 54* 64*  --  64*  --  67*  --  64*  CREATININE 2.35* 2.99*  --  2.92*  --  2.95*  --  2.79*  CALCIUM 7.1* 6.1*  --  6.1*  --  6.2*  --  6.6*  MG 1.7 1.5* 1.6*  --  1.5* 1.5* 1.3* 2.1  PHOS 7.2* 6.2* 5.4*  --  5.2* 5.4* 4.7* 4.7*   Estimated Creatinine Clearance: 23.8 mL/min (by C-G formula based on SCr of 2.79 mg/dL).  LIVER  Recent Labs Lab 01/25/2016 0550 01/18/2016 1447 01/28/2016 1859  AST 215* 156* 148*  ALT 255* 230* 216*  ALKPHOS 745* 688* 661*  BILITOT 0.9 1.1 1.1  PROT 4.6* 4.4* 4.2*  ALBUMIN 1.5*  1.5* 1.4* 1.3*  INR  --  1.24  --    INFECTIOUS  Recent Labs Lab  02/01/2016 1859  01/22/16 0201 01/23/16 0000 01/23/16 0458 01/23/16 1544  LATICACIDVEN  --   < >  --  8.7* 7.0* 3.0*  PROCALCITON 28.30  --  90.54  --  79.69  --   < > = values in this interval not displayed.  ENDOCRINE CBG (last 3)   Recent Labs  01/25/16 0804 01/25/16 1940 01/25/16 2353  GLUCAP 153* 128* 111*   IMAGING x48h  - image(s) personally visualized  -   highlighted in bold No results found. DISCUSSION: 75 yo male with recent admit and dc from [facility] with baseline RA , steroid dependent, developed ARDS and required tracheostomy. Renal function was stage 3 but he did come off CVVH. Transferred to [facility] 8/9 and PCCM consulted on same day. He spent 12 hours on trach collar 8/10 but on 8/11 he was back on full vent support with rr 37 , severe air hunger and anxiety. FOB per Dr. Rosina Lowenstein with rt lower lobe with copious thick white secretions. Due ti his decline aand family wishes he will be transferred to [facility] and to ICU and treated for suspected HCAP.   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  P:   D/C vent.  CARDIOVASCULAR A:  CAD, Mod AS A-fib with RVR.  P:  D/C pressors.  RENAL Lab Results  Component Value Date   CREATININE 2.79 (H) 01/25/2016   CREATININE 2.95 (H) 01/24/2016   CREATININE 2.92 (H) 01/23/2016   CREATININE 1.70 (H) 12/08/2015    Recent Labs Lab 01/23/16 1548 01/24/16 0505 01/25/16 0357  NA 147* 146* 147*   A:   CKD stage 3 Recent CVVH stopped 8/3  P:   D/C blood draws.  GASTROINTESTINAL A:   Elevated LFT - improved 8/13  P:   D/C TF.  HEMATOLOGIC  A:   Anticoagulation for Hx of Afib,coumadin currently on hold  P:  D/C blood draws.  INFECTIOUS A:   Staph likely MRSA bacteremia and HCAP  P D/c abx.  ENDOCRINE A:   No acute issues  P:   D/C stress dose steroids.  NEUROLOGIC A:   Anxiety in setting of resp ditress  P:   Versed and fentanyl for  comfort.  FAMILY  - Updates: Spoke with the entire family, ready for withdraw, will continue fentanyl, add versed then disconnect to  the vent.  - Inter-disciplinary family meet or Palliative Care meeting due by:  Done 01/22/16.  See above.  The patient is critically ill with multiple organ systems failure and requires high complexity decision making for assessment and support, frequent evaluation and titration of therapies, application of advanced monitoring technologies and extensive interpretation of multiple databases.   Critical Care Time devoted to patient care services described in this note is  35  Minutes. This time reflects time of care of this signee Dr Jennet Maduro. This critical care time does not reflect procedure time, or teaching time or supervisory time of PA/NP/Med student/Med Resident etc but could involve care discussion time.  Rush Farmer, M.D. [facility] Pulmonary/Critical Care Medicine. Pager: [phone]. After hours pager: [phone].  02-20-2016 11:11 AM

## 2016-02-11 NOTE — Progress Notes (Signed)
Notified [name] (Daughter) in regards to patient's blood pressure in mid 60s per aline. Patient family on the way.

## 2016-02-11 NOTE — Discharge Summary (Signed)
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  SECONDARY DIAGNOSES:  Bullous emphysema, pneumonia, coronary artery disease, atrial fibrillation, rapid ventricular response, moderate aortic stenosis, chronic kidney disease stage 3, anticoagulation for atrial fibrillation, Staph bacteremia, healthcare-acquired pneumonia.  HOSPITAL COURSE:  The patient is a 75 year old male with past medical history significant for end-stage COPD, presented to the hospital with severe shortness of breath.  The patient with baseline rheumatoid arthritis and steroid dependent, developed ARDS and required tracheostomy, was performed on August 3rd.  The patient was transferred to [facility] on August 9th.  PCM was consulted on same day, spent 12 hours on trach collar on 08/10 but on 8/11, he was __________ support with elevated respiratory rate, severe anxiety. Bronchoscopy was performed and the __________ was removed from the right lower lobe.  The patient continued to decline and family wished that the patient be transferred to coronary ICU where he was transferred. However, he continued to deteriorate.  We had a meeting, spoke with entire family concerning his plan of care and his likely recovery.  They insisted the patient would not want to be intubated.  They stated if it comes to __________ the patient is unlikely to make complete recovery in any timely manner and would not want to go through being in the nursing home and being debilitated to that extent at which point a decision was made that will make the patient full  DNR, he was on fentanyl drip already, started Versed drip, and the patient was disconnected from the ventilator to expire shortly thereafter with the family at beside.     Providence Lanius, MD     WJY/MEDQ  D:  02/05/2016  T:  02/06/2016  Job:  [id number]

## 2016-02-11 NOTE — Progress Notes (Signed)
Patient expired at 1329. No pulses or breath sounds present upon auscultation. No pulses palpable upon assessment. Pupils fixed and non reactive. This verified by Fransisco Beau RN. CDS notified of time of death as well as Elink. Also, MD Nelda Marseille was made aware of patient's passing.

## 2016-02-11 NOTE — Progress Notes (Signed)
Wasted 27mls of Versed and 200 mls of Fentanyl IV drip in sink, verified by Allegra Grana RN

## 2016-02-11 DEATH — deceased

## 2016-02-21 ENCOUNTER — Ambulatory Visit: Payer: Medicare Other | Admitting: Physician Assistant

## 2016-03-06 ENCOUNTER — Ambulatory Visit: Payer: Medicare Other | Admitting: Cardiovascular Disease

## 2016-06-12 IMAGING — DX DG HIP (WITH OR WITHOUT PELVIS) 2-3V*R*
3 series · 3 of 3 positions shown · non-contrast
Comparison: None.

CLINICAL DATA: No known injury, right hip pain

EXAM:
DG HIP (WITH OR WITHOUT PELVIS) 2-3V RIGHT

[pelvis ap]
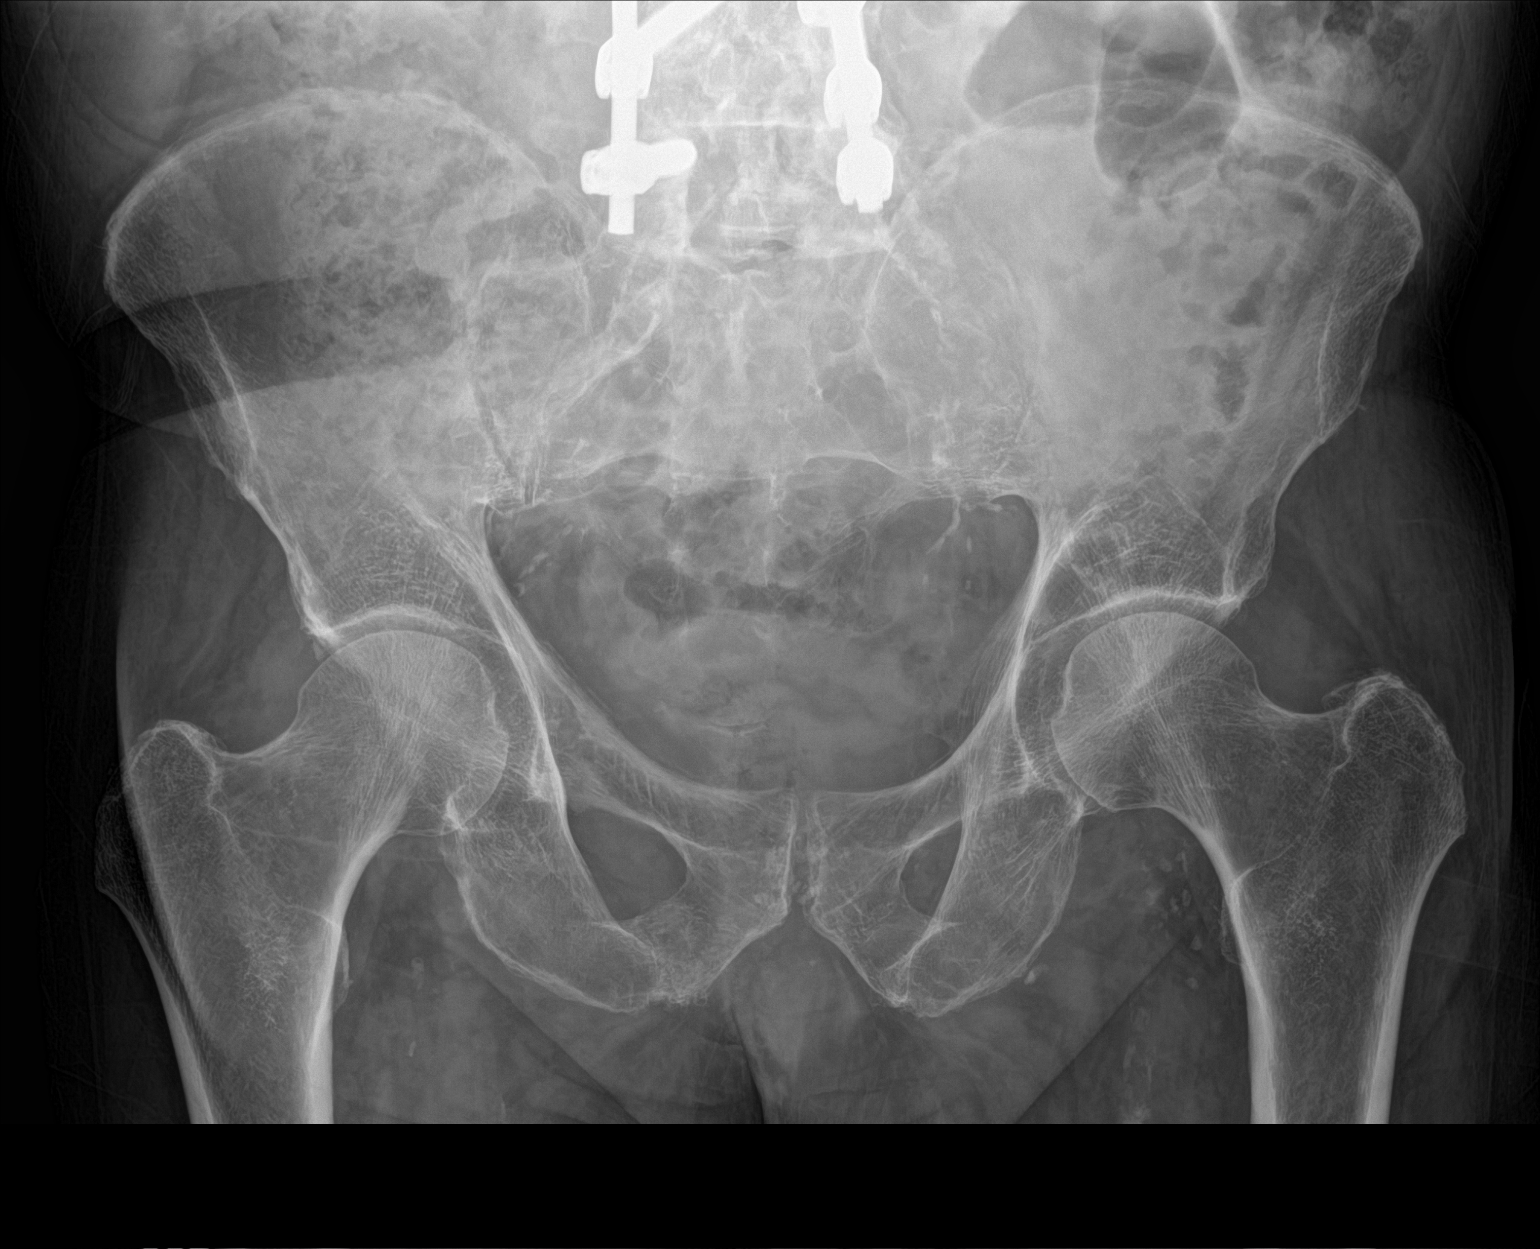

[hip ap]
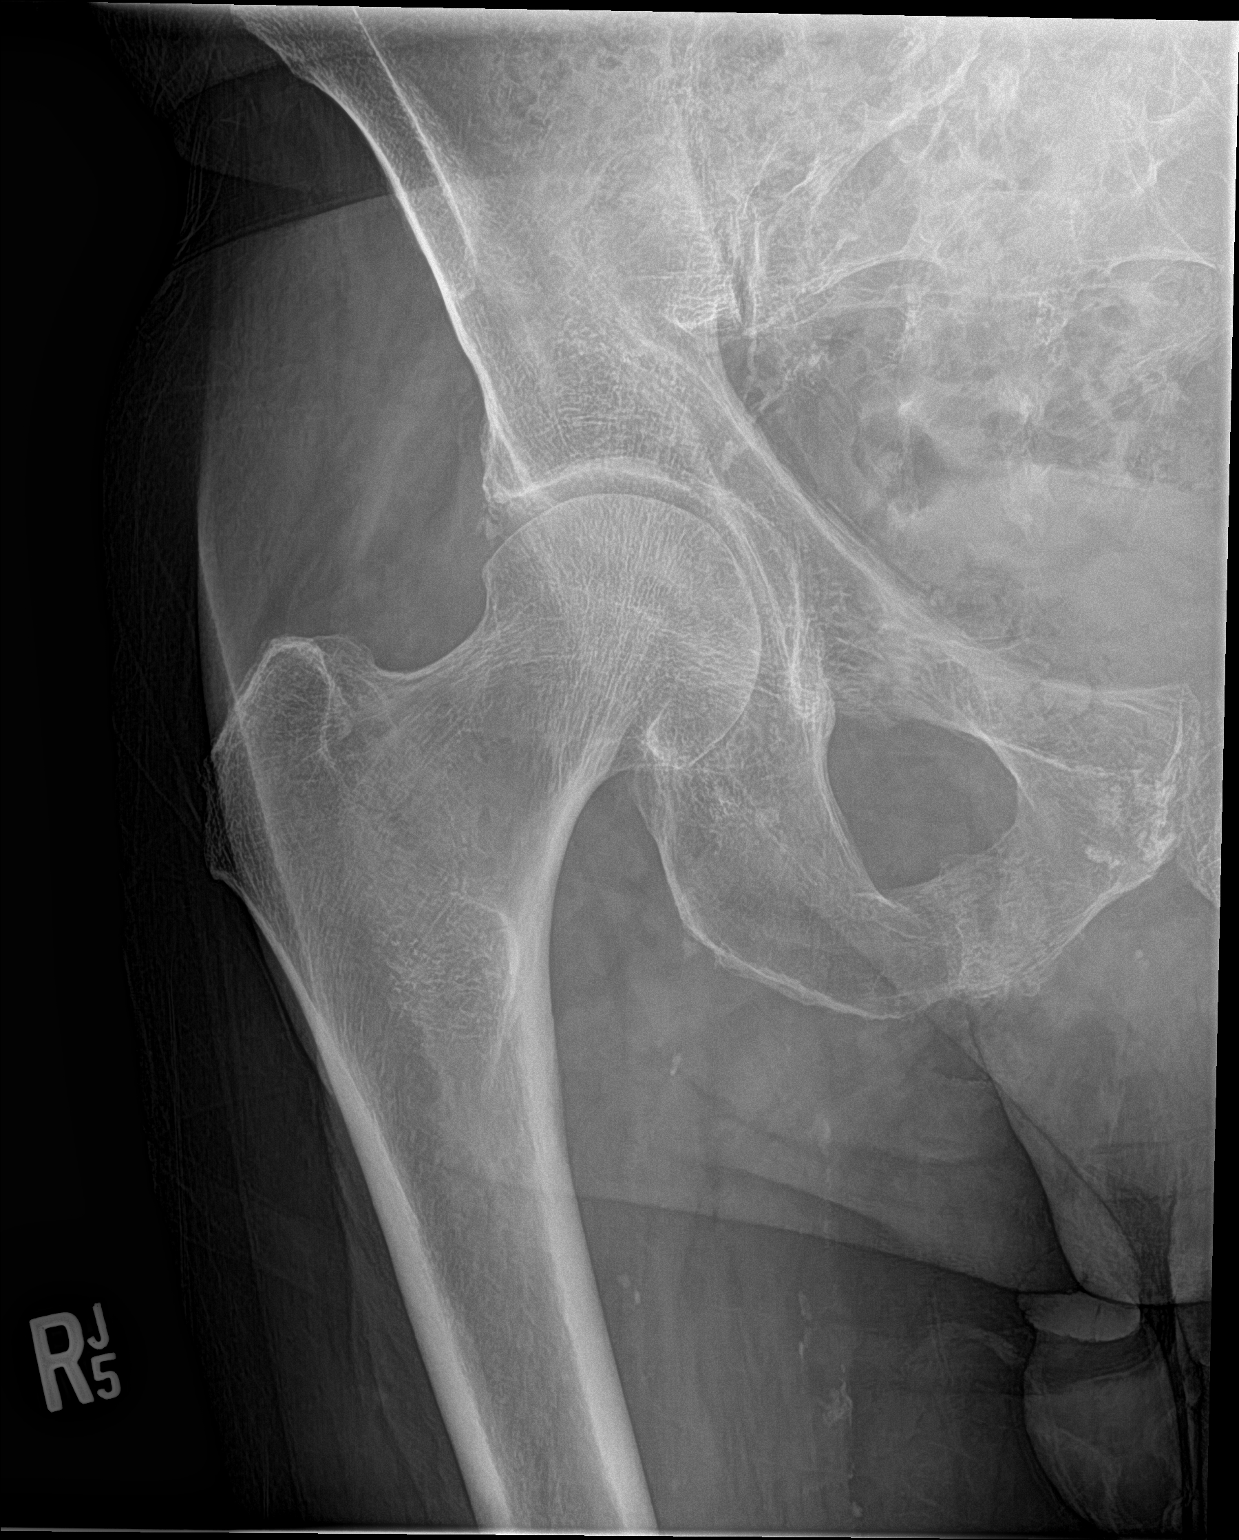

[hip lat]
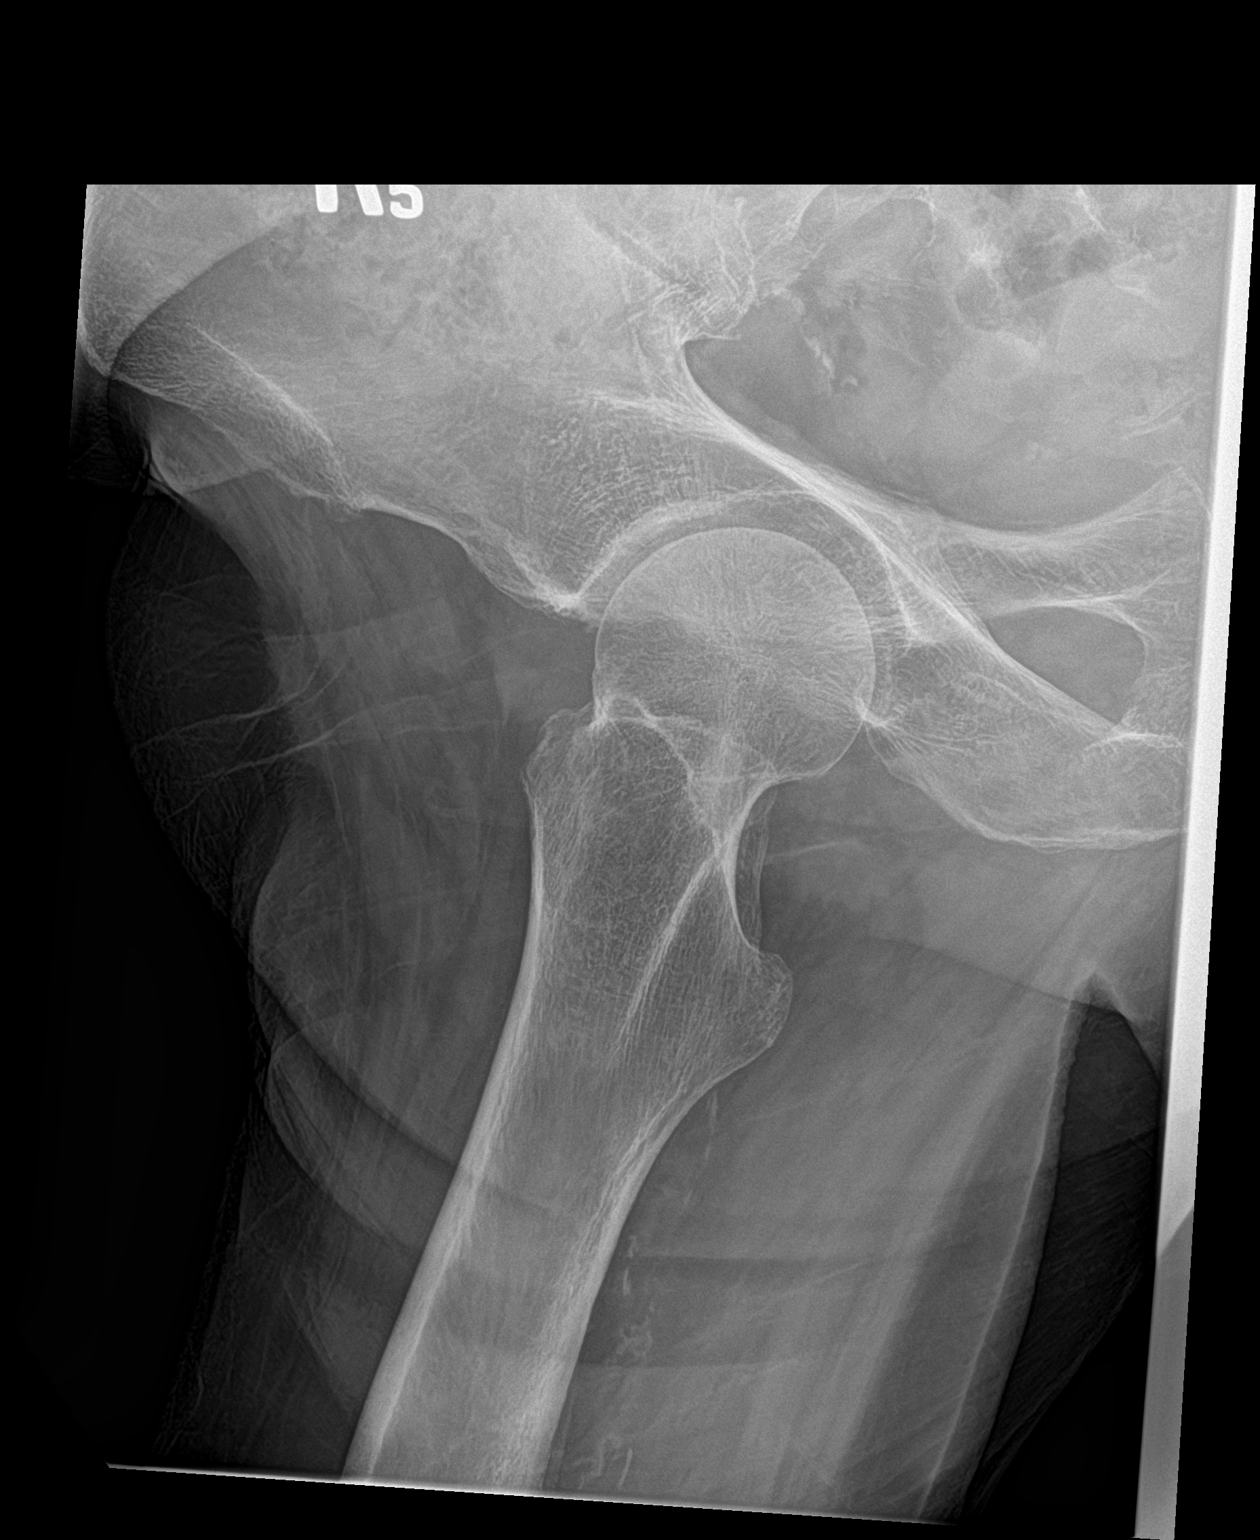

[3 of 3 positions shown; findings below may reference images not displayed]

FINDINGS: Mild narrowing of both hips was mild right osteophyte formation. No
fracture or dislocation. Pelvic bones intact. Lumbar spinal rods
partially visualized. Calcification of the aortoiliac and femoral
arteries bilaterally.
IMPRESSION: No acute findings

## 2016-09-22 ENCOUNTER — Encounter (HOSPITAL_COMMUNITY): Payer: Medicare Other

## 2020-10-22 ENCOUNTER — Other Ambulatory Visit (HOSPITAL_COMMUNITY): Payer: Medicare Other

## 2020-10-22 ENCOUNTER — Encounter (HOSPITAL_COMMUNITY): Payer: Medicare Other
